# Patient Record
Sex: Male | Born: 1940 | Race: White | Hispanic: No | Marital: Married | Smoking: Former smoker
Health system: Southern US, Community
[De-identification: ages and names within clinical notes are randomized; demographics above are authoritative.]

## PROBLEM LIST (undated history)

## (undated) DIAGNOSIS — J386 Stenosis of larynx: Secondary | ICD-10-CM

## (undated) DIAGNOSIS — I480 Paroxysmal atrial fibrillation: Secondary | ICD-10-CM

## (undated) DIAGNOSIS — M109 Gout, unspecified: Secondary | ICD-10-CM

## (undated) DIAGNOSIS — E669 Obesity, unspecified: Secondary | ICD-10-CM

## (undated) DIAGNOSIS — J9621 Acute and chronic respiratory failure with hypoxia: Secondary | ICD-10-CM

## (undated) DIAGNOSIS — N529 Male erectile dysfunction, unspecified: Secondary | ICD-10-CM

## (undated) DIAGNOSIS — C3492 Malignant neoplasm of unspecified part of left bronchus or lung: Secondary | ICD-10-CM

## (undated) DIAGNOSIS — I10 Essential (primary) hypertension: Secondary | ICD-10-CM

## (undated) DIAGNOSIS — I4891 Unspecified atrial fibrillation: Secondary | ICD-10-CM

## (undated) DIAGNOSIS — C61 Malignant neoplasm of prostate: Secondary | ICD-10-CM

## (undated) DIAGNOSIS — E119 Type 2 diabetes mellitus without complications: Secondary | ICD-10-CM

## (undated) DIAGNOSIS — Z87442 Personal history of urinary calculi: Secondary | ICD-10-CM

## (undated) DIAGNOSIS — E039 Hypothyroidism, unspecified: Secondary | ICD-10-CM

## (undated) DIAGNOSIS — C7989 Secondary malignant neoplasm of other specified sites: Secondary | ICD-10-CM

## (undated) DIAGNOSIS — M459 Ankylosing spondylitis of unspecified sites in spine: Secondary | ICD-10-CM

## (undated) DIAGNOSIS — C44309 Unspecified malignant neoplasm of skin of other parts of face: Secondary | ICD-10-CM

## (undated) DIAGNOSIS — C73 Malignant neoplasm of thyroid gland: Secondary | ICD-10-CM

## (undated) DIAGNOSIS — M810 Age-related osteoporosis without current pathological fracture: Secondary | ICD-10-CM

## (undated) DIAGNOSIS — K5090 Crohn's disease, unspecified, without complications: Secondary | ICD-10-CM

## (undated) DIAGNOSIS — L409 Psoriasis, unspecified: Secondary | ICD-10-CM

## (undated) DIAGNOSIS — E109 Type 1 diabetes mellitus without complications: Secondary | ICD-10-CM

## (undated) HISTORY — DX: Male erectile dysfunction, unspecified: N52.9

## (undated) HISTORY — DX: Crohn's disease, unspecified, without complications: K50.90

## (undated) HISTORY — PX: TONSILLECTOMY: SUR1361

## (undated) HISTORY — DX: Secondary malignant neoplasm of other specified sites: C79.89

## (undated) HISTORY — DX: Obesity, unspecified: E66.9

## (undated) HISTORY — DX: Hypocalcemia: E83.51

## (undated) HISTORY — DX: Gout, unspecified: M10.9

## (undated) HISTORY — PX: HERNIA REPAIR: SHX51

## (undated) HISTORY — PX: BASAL CELL CARCINOMA EXCISION: SHX1214

## (undated) HISTORY — DX: Unspecified malignant neoplasm of skin of other parts of face: C44.309

## (undated) HISTORY — DX: Secondary malignant neoplasm of other specified sites: C61

## (undated) HISTORY — DX: Malignant neoplasm of unspecified part of left bronchus or lung: C34.92

## (undated) HISTORY — DX: Type 1 diabetes mellitus without complications: E10.9

## (undated) HISTORY — DX: Age-related osteoporosis without current pathological fracture: M81.0

## (undated) HISTORY — DX: Ankylosing spondylitis of unspecified sites in spine: M45.9

## (undated) HISTORY — PX: BACK SURGERY: SHX140

## (undated) HISTORY — PX: ABDOMINAL EXPLORATION SURGERY: SHX538

## (undated) HISTORY — DX: Psoriasis, unspecified: L40.9

---

## 1995-06-11 HISTORY — PX: TOTAL THYROIDECTOMY: SHX2547

## 1997-06-10 DIAGNOSIS — C73 Malignant neoplasm of thyroid gland: Secondary | ICD-10-CM

## 1997-06-10 HISTORY — DX: Malignant neoplasm of thyroid gland: C73

## 1999-01-12 ENCOUNTER — Ambulatory Visit (HOSPITAL_COMMUNITY): Admission: RE | Admit: 1999-01-12 | Discharge: 1999-01-12 | Payer: Self-pay | Admitting: Endocrinology

## 1999-01-15 ENCOUNTER — Encounter: Payer: Self-pay | Admitting: Endocrinology

## 2001-06-01 ENCOUNTER — Encounter: Admission: RE | Admit: 2001-06-01 | Discharge: 2001-06-01 | Payer: Self-pay | Admitting: Urology

## 2001-06-01 ENCOUNTER — Encounter: Payer: Self-pay | Admitting: Urology

## 2001-06-10 HISTORY — PX: PROSTATECTOMY: SHX69

## 2001-06-23 ENCOUNTER — Encounter: Payer: Self-pay | Admitting: Urology

## 2001-06-29 ENCOUNTER — Inpatient Hospital Stay (HOSPITAL_COMMUNITY): Admission: RE | Admit: 2001-06-29 | Discharge: 2001-07-02 | Payer: Self-pay | Admitting: Urology

## 2001-06-29 ENCOUNTER — Encounter (INDEPENDENT_AMBULATORY_CARE_PROVIDER_SITE_OTHER): Payer: Self-pay | Admitting: *Deleted

## 2003-02-17 HISTORY — PX: OTHER SURGICAL HISTORY: SHX169

## 2003-03-17 ENCOUNTER — Encounter: Payer: Self-pay | Admitting: Cardiology

## 2003-03-17 ENCOUNTER — Ambulatory Visit (HOSPITAL_COMMUNITY): Admission: RE | Admit: 2003-03-17 | Discharge: 2003-03-17 | Payer: Self-pay | Admitting: Cardiology

## 2003-03-17 HISTORY — PX: CARDIAC CATHETERIZATION: SHX172

## 2003-10-24 ENCOUNTER — Ambulatory Visit (HOSPITAL_COMMUNITY): Admission: RE | Admit: 2003-10-24 | Discharge: 2003-10-24 | Payer: Self-pay | Admitting: Endocrinology

## 2003-12-19 LAB — HM COLONOSCOPY

## 2003-12-22 ENCOUNTER — Ambulatory Visit (HOSPITAL_COMMUNITY): Admission: RE | Admit: 2003-12-22 | Discharge: 2003-12-22 | Payer: Self-pay | Admitting: Endocrinology

## 2004-02-03 ENCOUNTER — Encounter (HOSPITAL_COMMUNITY): Admission: RE | Admit: 2004-02-03 | Discharge: 2004-05-03 | Payer: Self-pay | Admitting: Gastroenterology

## 2004-05-28 ENCOUNTER — Ambulatory Visit: Payer: Self-pay | Admitting: Endocrinology

## 2004-05-28 ENCOUNTER — Ambulatory Visit: Payer: Self-pay

## 2004-05-30 ENCOUNTER — Ambulatory Visit: Payer: Self-pay | Admitting: Endocrinology

## 2004-05-30 ENCOUNTER — Inpatient Hospital Stay (HOSPITAL_COMMUNITY): Admission: EM | Admit: 2004-05-30 | Discharge: 2004-06-01 | Payer: Self-pay | Admitting: Endocrinology

## 2004-05-30 HISTORY — PX: OTHER SURGICAL HISTORY: SHX169

## 2004-06-06 ENCOUNTER — Ambulatory Visit: Payer: Self-pay | Admitting: Endocrinology

## 2004-06-13 ENCOUNTER — Ambulatory Visit: Payer: Self-pay | Admitting: Endocrinology

## 2004-07-06 ENCOUNTER — Ambulatory Visit: Payer: Self-pay | Admitting: Endocrinology

## 2004-07-19 ENCOUNTER — Ambulatory Visit: Payer: Self-pay | Admitting: Endocrinology

## 2004-09-27 ENCOUNTER — Ambulatory Visit: Payer: Self-pay | Admitting: Endocrinology

## 2004-10-16 ENCOUNTER — Ambulatory Visit: Payer: Self-pay | Admitting: Endocrinology

## 2004-11-21 ENCOUNTER — Ambulatory Visit: Payer: Self-pay | Admitting: Endocrinology

## 2005-01-09 ENCOUNTER — Ambulatory Visit: Payer: Self-pay | Admitting: Endocrinology

## 2005-01-23 ENCOUNTER — Ambulatory Visit: Payer: Self-pay | Admitting: Endocrinology

## 2005-08-15 ENCOUNTER — Ambulatory Visit: Payer: Self-pay | Admitting: Endocrinology

## 2005-08-22 ENCOUNTER — Ambulatory Visit: Payer: Self-pay | Admitting: Cardiology

## 2005-11-26 ENCOUNTER — Ambulatory Visit: Payer: Self-pay | Admitting: Endocrinology

## 2006-02-23 IMAGING — US US RETROPERITONEAL COMPLETE
1 series · 14 of 25 positions shown · non-contrast
Comparison: none

CLINICAL DATA: the patient has renal insufficiency. 
 RENAL ULTRASOUND
 The right kidney measures 11.1 cm and the left kidney measures 12.1 cm.  There is some lobulation to the cortex of the right kidney without a discrete mass. No hydronephrosis. Renal echo pattern is within normal limits.  
 IMPRESSION 
 No hydronephrosis.  Slight lobulation of the right kidney without definite abnormality.  Renal cortex echo pattern is within normal limits.

[Series 1: unknown · 0.25mm/px · 14 of 33 slices shown]
[im 1/33]
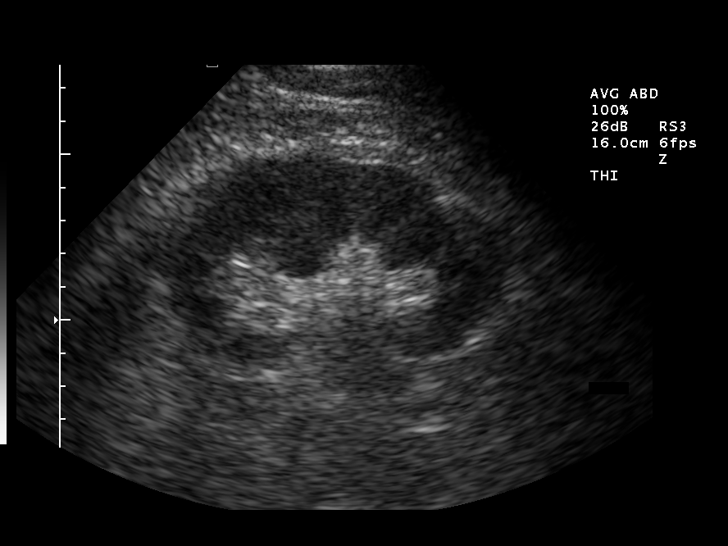
[im 3/33]
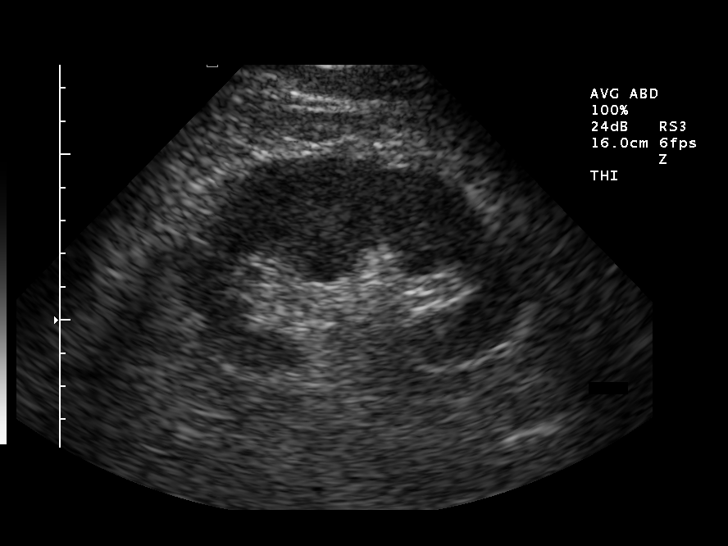
[im 6/33]
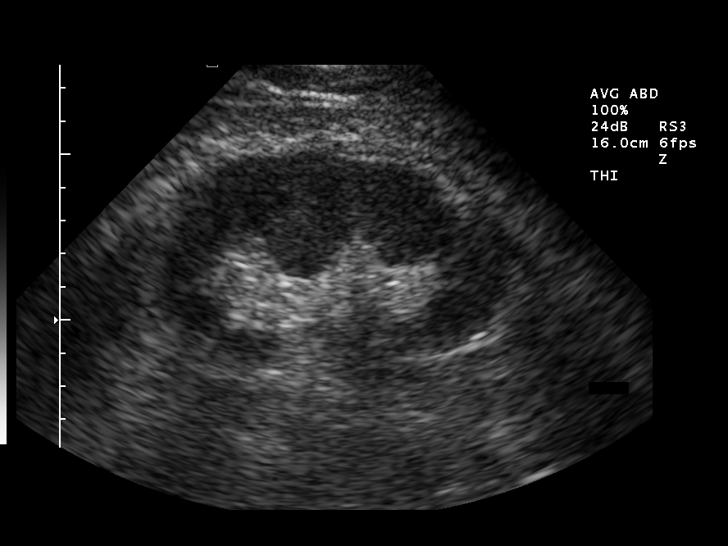
[im 9/33]
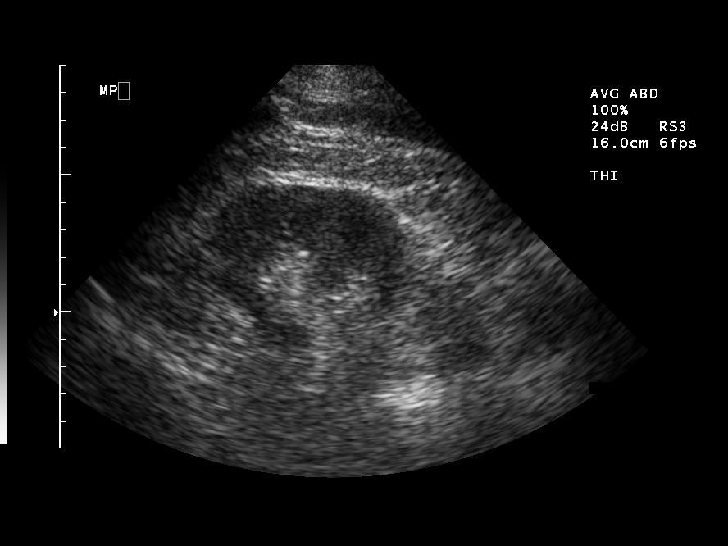
[im 11/33]
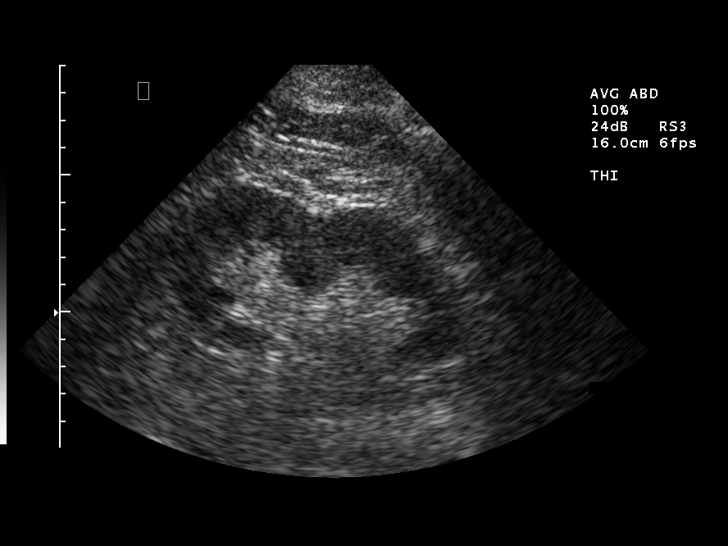
[im 13/33]
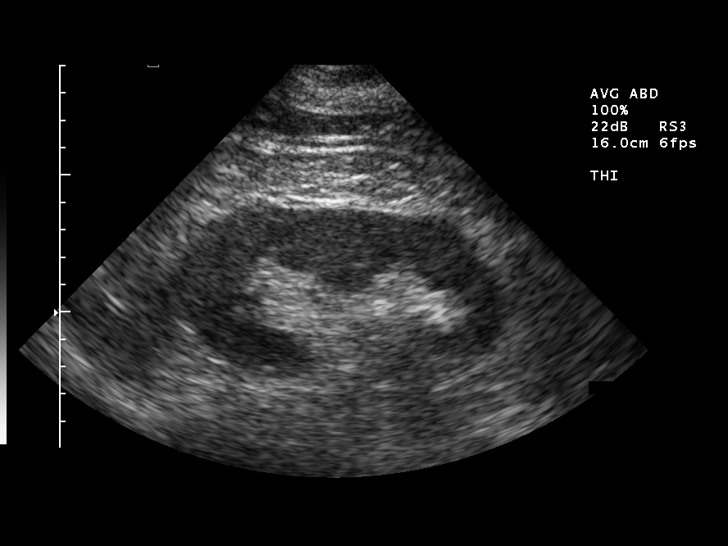
[im 15/33]
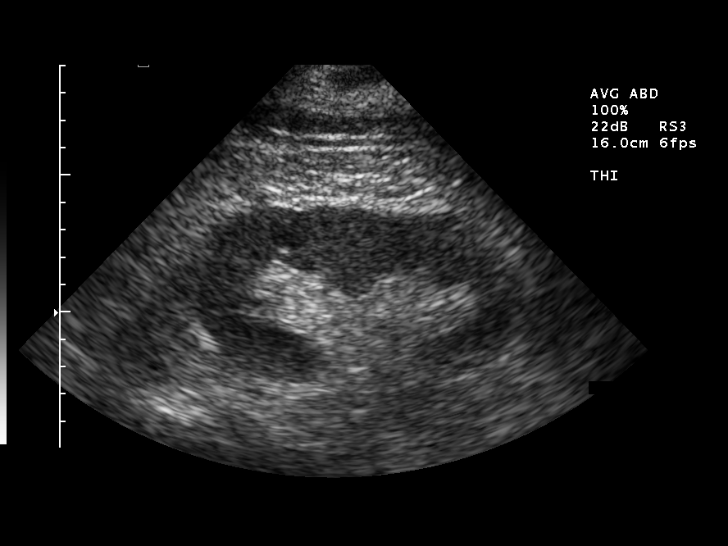
[im 18/33]
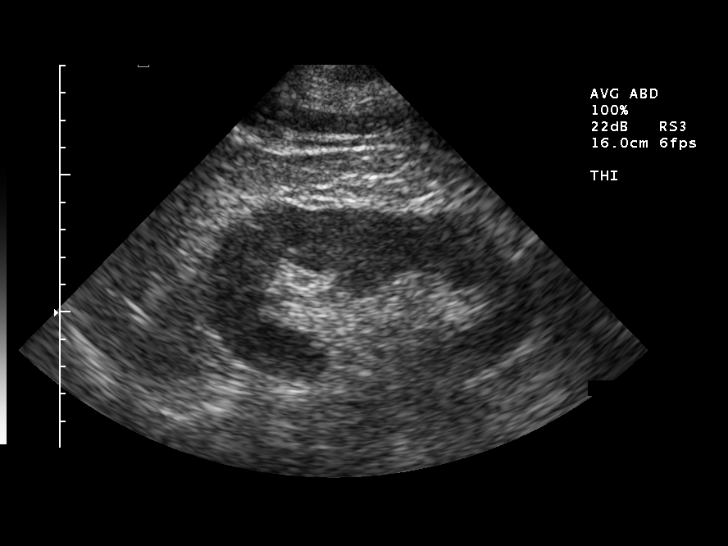
[im 21/33]
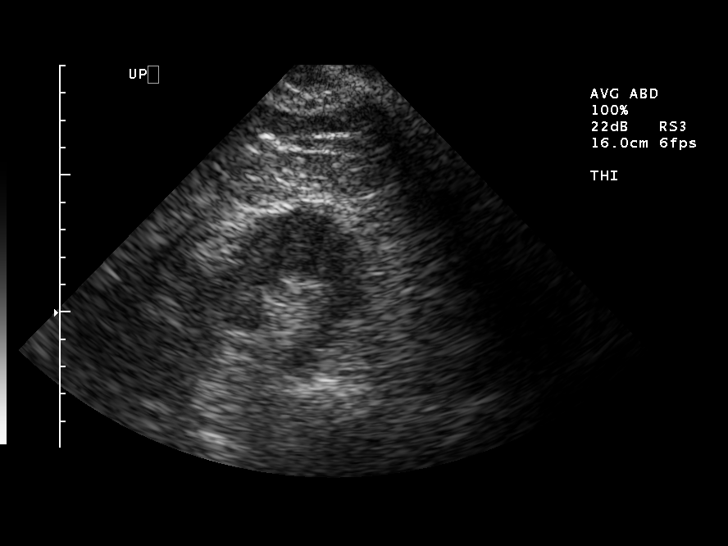
[im 22/33]
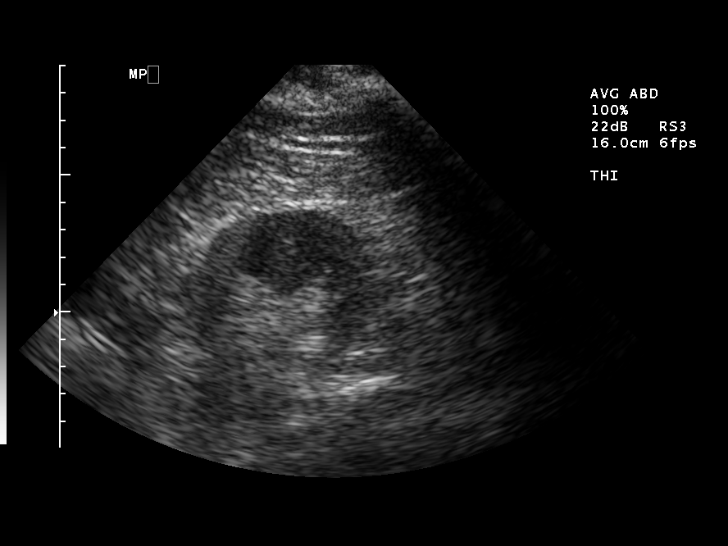
[im 25/33]
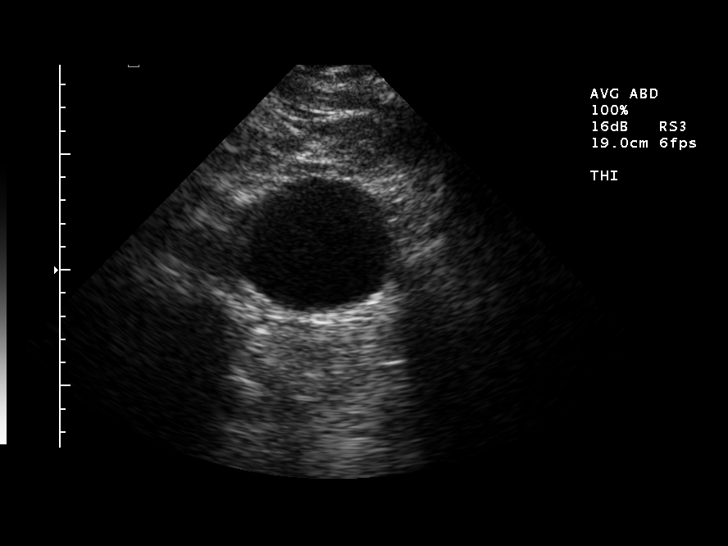
[im 27/33]
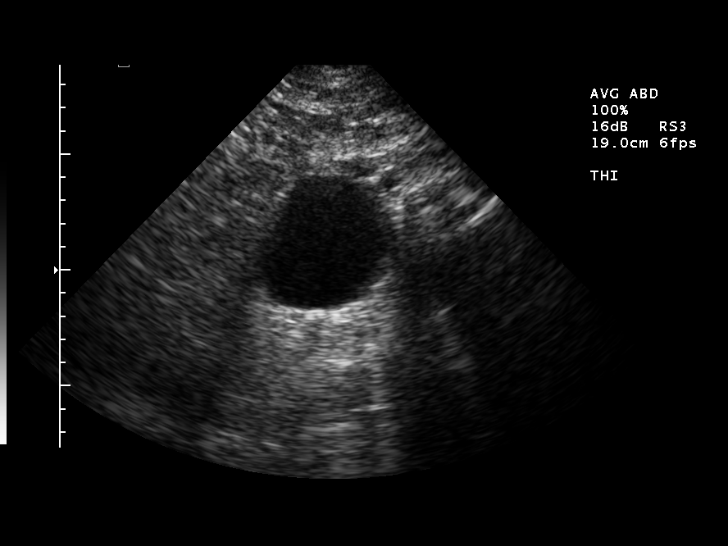
[im 30/33]
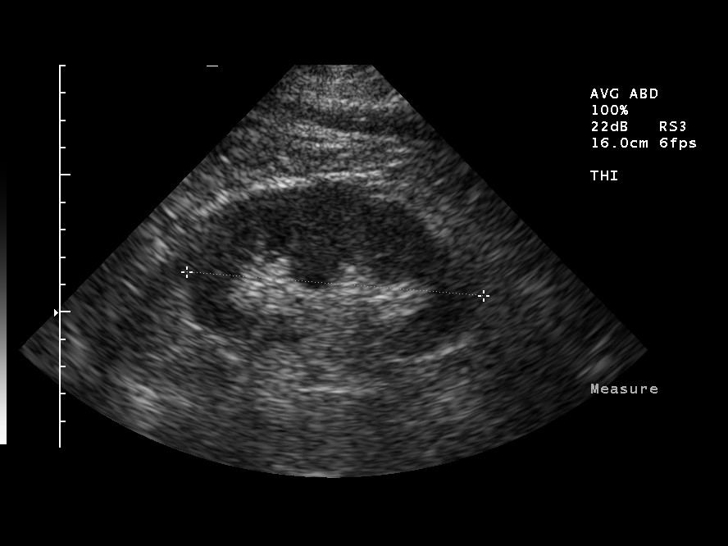
[im 33/33]
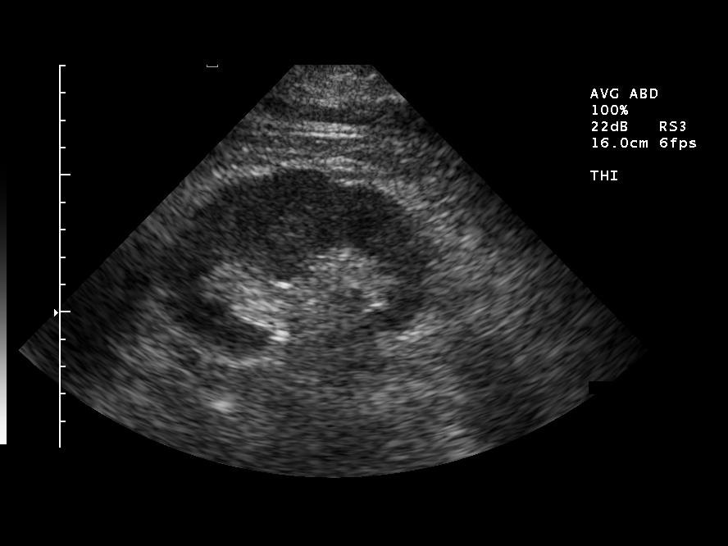

[14 of 25 positions shown; findings below may reference images not displayed]

## 2006-03-18 ENCOUNTER — Ambulatory Visit: Payer: Self-pay | Admitting: Endocrinology

## 2006-03-18 LAB — CONVERTED CEMR LAB
ALT: 32 units/L (ref 0–40)
AST: 36 units/L (ref 0–37)
Albumin: 3.5 g/dL (ref 3.5–5.2)
Alkaline Phosphatase: 56 units/L (ref 39–117)
BUN: 21 mg/dL (ref 6–23)
Basophils Absolute: 0 10*3/uL (ref 0.0–0.1)
Basophils Relative: 0.6 % (ref 0.0–1.0)
Bilirubin Urine: NEGATIVE
CO2: 25 meq/L (ref 19–32)
Calcium: 9.3 mg/dL (ref 8.4–10.5)
Chloride: 110 meq/L (ref 96–112)
Chol/HDL Ratio, serum: 3.1
Cholesterol: 113 mg/dL (ref 0–200)
Creatinine, Ser: 1.8 mg/dL — ABNORMAL HIGH (ref 0.4–1.5)
Creatinine,U: 208 mg/dL
Eosinophil percent: 2.4 % (ref 0.0–5.0)
GFR calc non Af Amer: 41 mL/min
Glomerular Filtration Rate, Af Am: 49 mL/min/{1.73_m2}
Glucose, Bld: 85 mg/dL (ref 70–99)
HCT: 35.5 % — ABNORMAL LOW (ref 39.0–52.0)
HDL: 36.2 mg/dL — ABNORMAL LOW (ref >39.0)
Hemoglobin, Urine: NEGATIVE
Hemoglobin: 11.9 g/dL — ABNORMAL LOW (ref 13.0–17.0)
Ketones, ur: NEGATIVE mg/dL
LDL Cholesterol: 47 mg/dL (ref 0–99)
Leukocytes, UA: NEGATIVE
Lymphocytes Relative: 12 % (ref 12.0–46.0)
MCHC: 33.4 g/dL (ref 30.0–36.0)
MCV: 98.6 fL (ref 78.0–100.0)
Microalb Creat Ratio: 2.9 mg/g (ref 0.0–30.0)
Microalb, Ur: 0.6 mg/dL (ref 0.0–1.9)
Monocytes Absolute: 0.6 10*3/uL (ref 0.2–0.7)
Monocytes Relative: 8.3 % (ref 3.0–11.0)
Neutro Abs: 5.4 10*3/uL (ref 1.4–7.7)
Neutrophils Relative %: 76.7 % (ref 43.0–77.0)
Nitrite: NEGATIVE
PSA: 0.29 ng/mL (ref 0.10–4.00)
Platelets: 398 10*3/uL (ref 150–400)
Potassium: 4.4 meq/L (ref 3.5–5.1)
RBC: 3.6 M/uL — ABNORMAL LOW (ref 4.22–5.81)
RDW: 15.3 % — ABNORMAL HIGH (ref 11.5–14.6)
Sodium: 141 meq/L (ref 135–145)
Specific Gravity, Urine: >=1.03 (ref 1.000–1.03)
TSH: 0.6 microintl units/mL (ref 0.35–5.50)
Total Bilirubin: 0.7 mg/dL (ref 0.3–1.2)
Total Protein, Urine: NEGATIVE mg/dL
Total Protein: 6.4 g/dL (ref 6.0–8.3)
Triglyceride fasting, serum: 151 mg/dL — ABNORMAL HIGH (ref 0–149)
Urine Glucose: NEGATIVE mg/dL
Urobilinogen, UA: 0.2 (ref 0.0–1.0)
VLDL: 30 mg/dL (ref 0–40)
WBC: 7 10*3/uL (ref 4.5–10.5)
pH: 6 (ref 5.0–8.0)

## 2006-03-27 ENCOUNTER — Ambulatory Visit: Payer: Self-pay | Admitting: Endocrinology

## 2006-05-07 ENCOUNTER — Ambulatory Visit (HOSPITAL_COMMUNITY): Admission: RE | Admit: 2006-05-07 | Discharge: 2006-05-07 | Payer: Self-pay | Admitting: Gastroenterology

## 2006-05-08 ENCOUNTER — Ambulatory Visit (HOSPITAL_COMMUNITY): Admission: RE | Admit: 2006-05-08 | Discharge: 2006-05-08 | Payer: Self-pay | Admitting: Gastroenterology

## 2006-08-02 IMAGING — CR DG CHEST 2V
3 series · 3 of 3 positions shown · non-contrast
Comparison: none
 There is mild elevation of the right hemidiaphragm and there are mild linear atelectatic/scarring changes seen within the right base.

CLINICAL DATA: cellulitis
 CHEST-TWO VIEWS:

[view not recorded (1 of 3)]
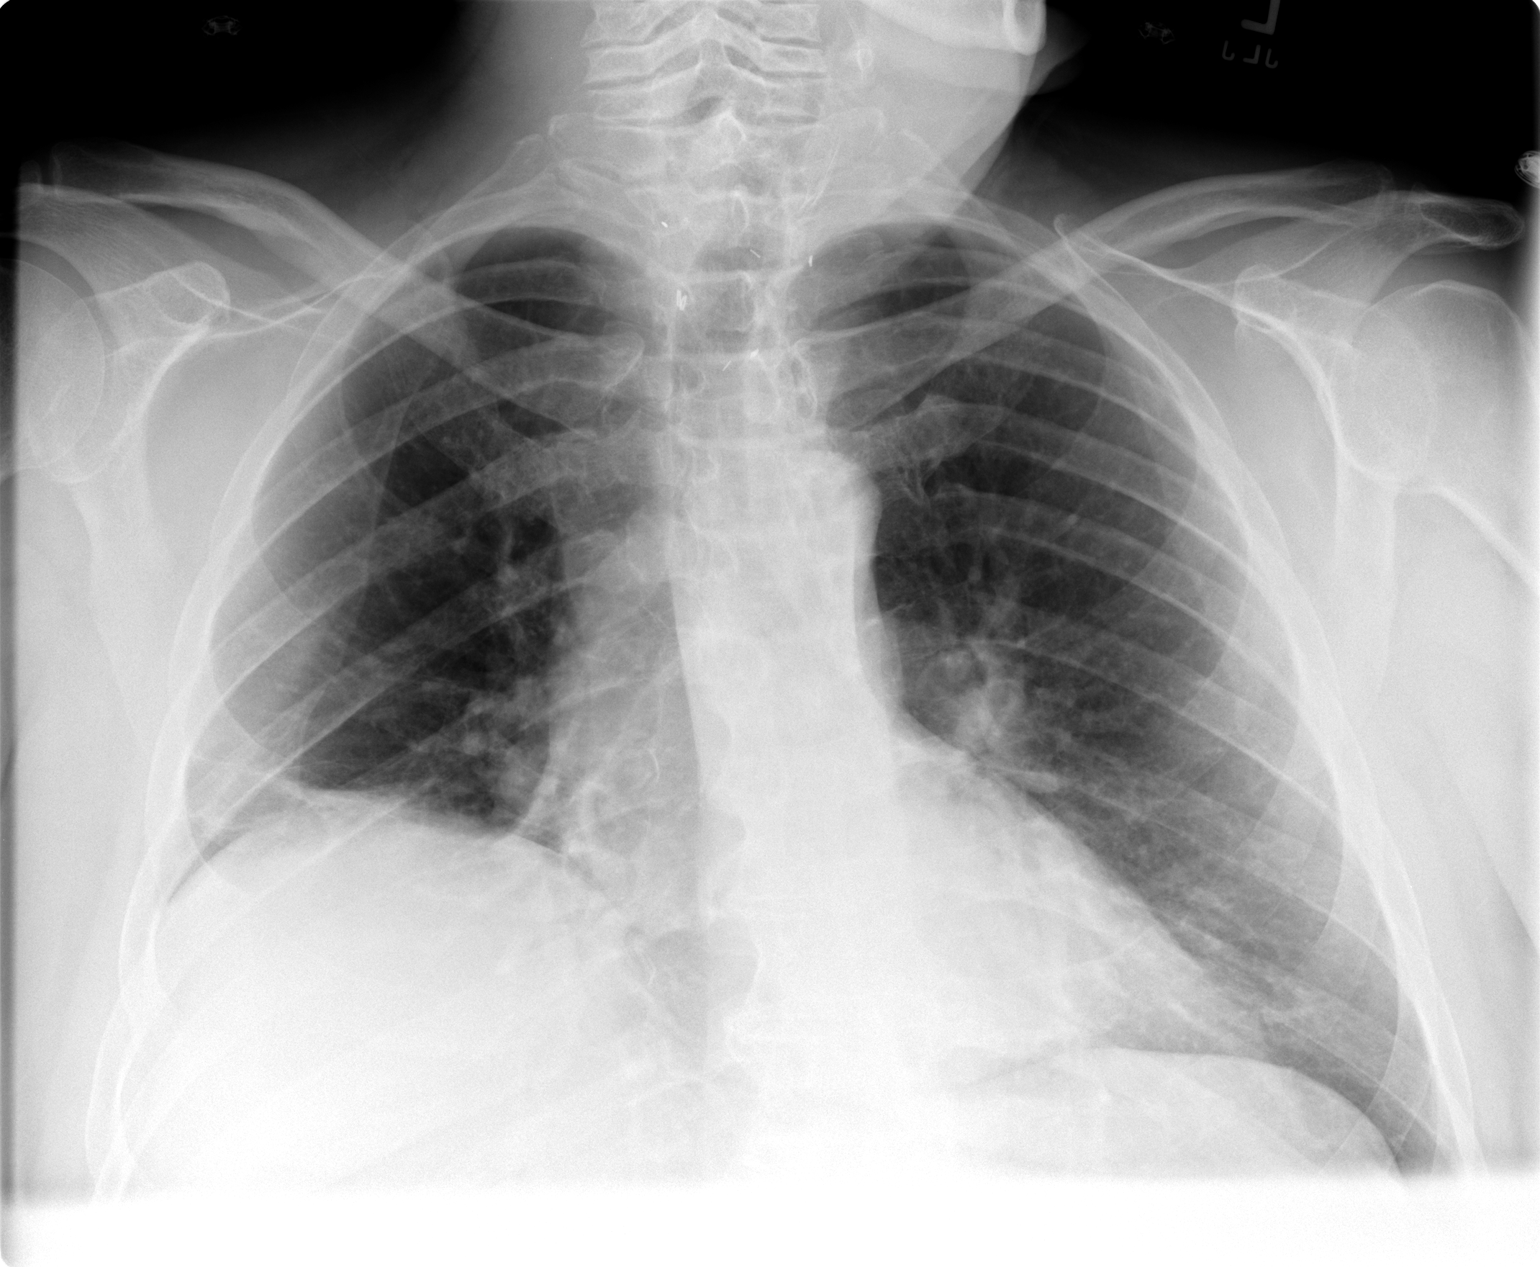

[view not recorded (2 of 3)]
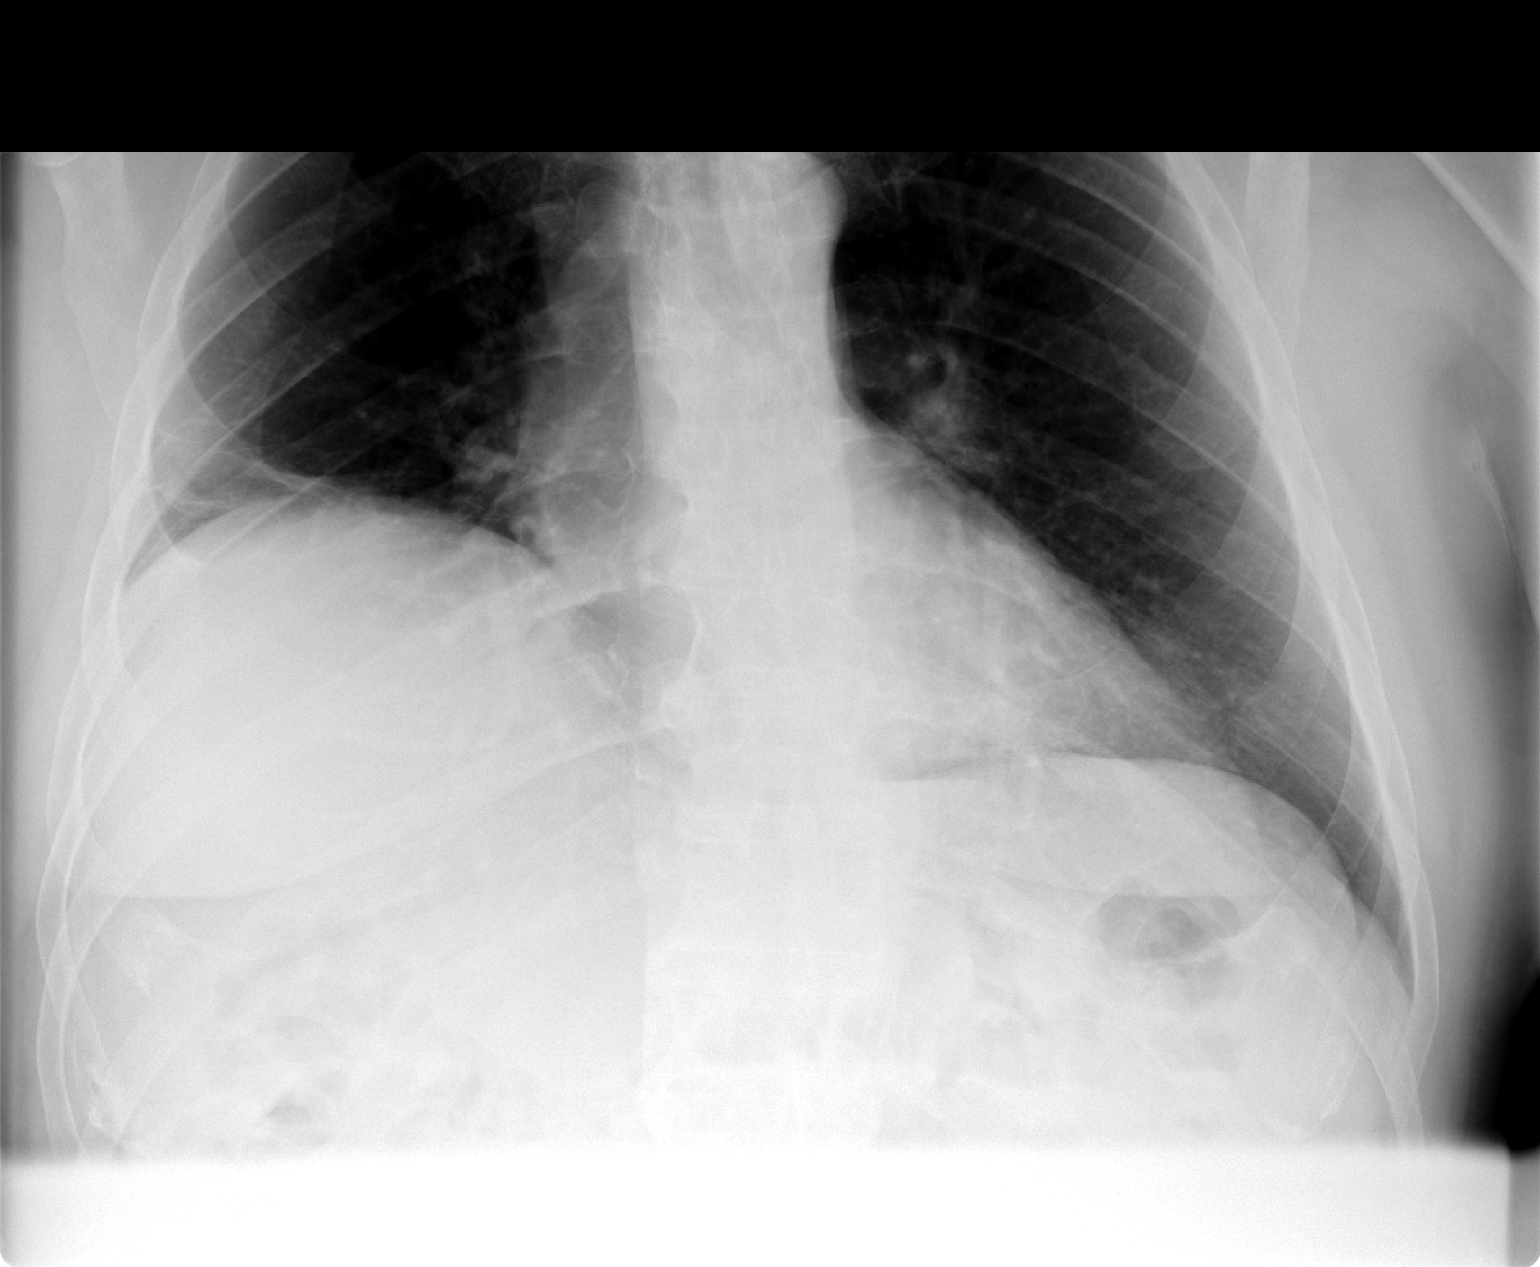

[view not recorded (3 of 3)]
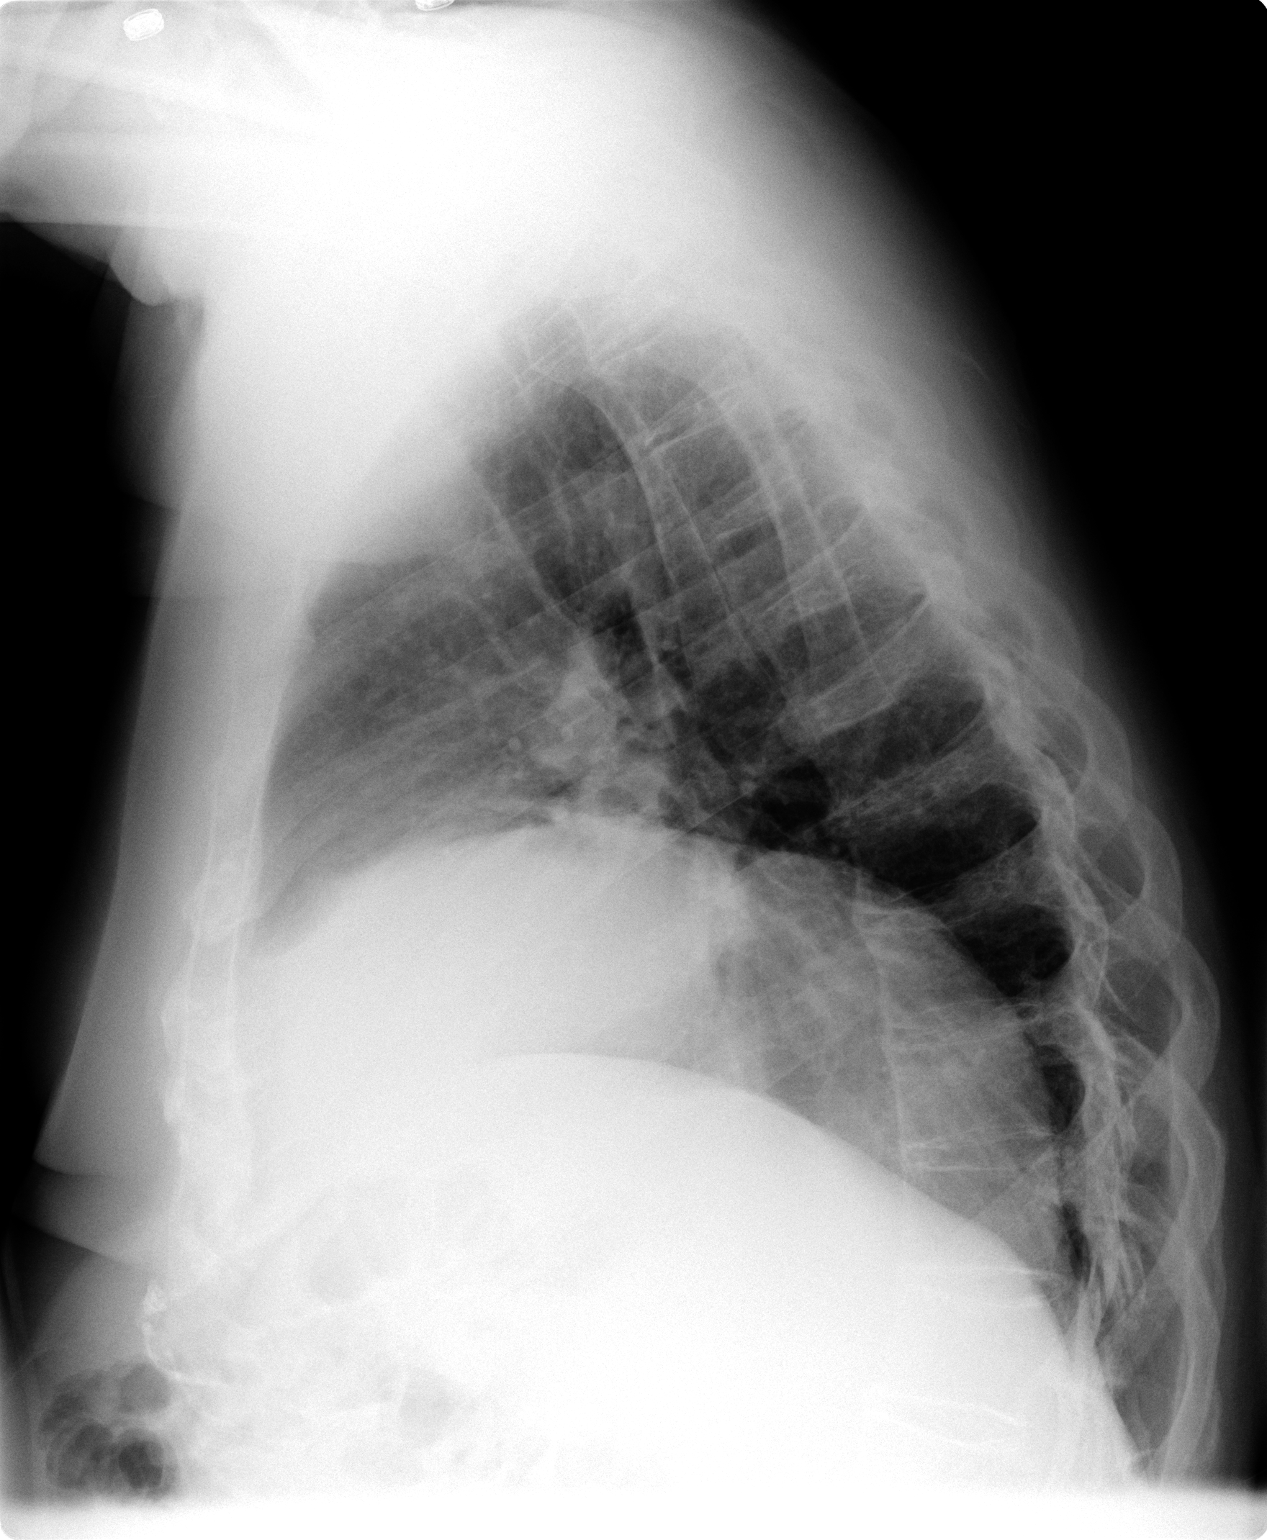

[3 of 3 positions shown; findings below may reference images not displayed]

The left lung is clear.  The heart and mediastinal structures are normal.   There are surgical clips seen within the superior mediastinum.
IMPRESSION: Elevation of the right hemidiaphragm with mild linear atelectatic/scarring changes within the right lung base.

## 2006-09-09 ENCOUNTER — Ambulatory Visit: Payer: Self-pay | Admitting: Endocrinology

## 2006-09-09 LAB — CONVERTED CEMR LAB
Basophils Absolute: 0 10*3/uL (ref 0.0–0.1)
Basophils Relative: 0.4 % (ref 0.0–1.0)
Eosinophils Absolute: 0.1 10*3/uL (ref 0.0–0.6)
Eosinophils Relative: 1.4 % (ref 0.0–5.0)
HCT: 35.6 % — ABNORMAL LOW (ref 39.0–52.0)
Hemoglobin: 12.2 g/dL — ABNORMAL LOW (ref 13.0–17.0)
Hgb A1c MFr Bld: 5.6 % (ref 4.6–6.0)
Iron: 51 ug/dL (ref 42–165)
Lymphocytes Relative: 10 % — ABNORMAL LOW (ref 12.0–46.0)
MCHC: 34.3 g/dL (ref 30.0–36.0)
MCV: 99.8 fL (ref 78.0–100.0)
Monocytes Absolute: 0.8 10*3/uL — ABNORMAL HIGH (ref 0.2–0.7)
Monocytes Relative: 8.3 % (ref 3.0–11.0)
Neutro Abs: 7.9 10*3/uL — ABNORMAL HIGH (ref 1.4–7.7)
Neutrophils Relative %: 79.9 % — ABNORMAL HIGH (ref 43.0–77.0)
Platelets: 333 10*3/uL (ref 150–400)
RBC: 3.57 M/uL — ABNORMAL LOW (ref 4.22–5.81)
RDW: 14.9 % — ABNORMAL HIGH (ref 11.5–14.6)
Saturation Ratios: 14.8 % — ABNORMAL LOW (ref 20.0–50.0)
Thyroglobulin Ab: <30 (ref 0.0–60.0)
Transferrin: 246.3 mg/dL (ref >=212.0)
WBC: 9.8 10*3/uL (ref 4.5–10.5)

## 2006-12-29 ENCOUNTER — Encounter: Payer: Self-pay | Admitting: Endocrinology

## 2006-12-29 DIAGNOSIS — M810 Age-related osteoporosis without current pathological fracture: Secondary | ICD-10-CM | POA: Insufficient documentation

## 2006-12-29 DIAGNOSIS — Z794 Long term (current) use of insulin: Secondary | ICD-10-CM

## 2006-12-29 DIAGNOSIS — C61 Malignant neoplasm of prostate: Secondary | ICD-10-CM | POA: Insufficient documentation

## 2006-12-29 DIAGNOSIS — I10 Essential (primary) hypertension: Secondary | ICD-10-CM | POA: Insufficient documentation

## 2006-12-29 DIAGNOSIS — C7989 Secondary malignant neoplasm of other specified sites: Secondary | ICD-10-CM | POA: Insufficient documentation

## 2006-12-29 DIAGNOSIS — E1129 Type 2 diabetes mellitus with other diabetic kidney complication: Secondary | ICD-10-CM | POA: Insufficient documentation

## 2006-12-29 DIAGNOSIS — M109 Gout, unspecified: Secondary | ICD-10-CM | POA: Insufficient documentation

## 2007-03-11 HISTORY — PX: COLON SURGERY: SHX602

## 2007-03-11 HISTORY — PX: APPENDECTOMY: SHX54

## 2007-03-13 ENCOUNTER — Encounter: Admission: RE | Admit: 2007-03-13 | Discharge: 2007-03-13 | Payer: Self-pay | Admitting: Obstetrics and Gynecology

## 2007-03-26 ENCOUNTER — Telehealth (INDEPENDENT_AMBULATORY_CARE_PROVIDER_SITE_OTHER): Payer: Self-pay | Admitting: *Deleted

## 2007-04-01 ENCOUNTER — Inpatient Hospital Stay (HOSPITAL_COMMUNITY): Admission: RE | Admit: 2007-04-01 | Discharge: 2007-04-07 | Payer: Self-pay | Admitting: Surgery

## 2007-04-01 ENCOUNTER — Encounter (INDEPENDENT_AMBULATORY_CARE_PROVIDER_SITE_OTHER): Payer: Self-pay | Admitting: Surgery

## 2007-04-13 ENCOUNTER — Ambulatory Visit: Payer: Self-pay | Admitting: Endocrinology

## 2007-04-13 HISTORY — DX: Hypocalcemia: E83.51

## 2007-04-14 ENCOUNTER — Encounter: Payer: Self-pay | Admitting: Endocrinology

## 2007-04-14 LAB — CONVERTED CEMR LAB
Calcium, Total (PTH): 9.1 mg/dL (ref 8.4–10.5)
Cholesterol: 139 mg/dL (ref 0–200)
Direct LDL: 61.6 mg/dL
HDL: 42 mg/dL (ref >39.0)
Hgb A1c MFr Bld: 5.9 % (ref 4.6–6.0)
PTH: 22.3 pg/mL (ref 14.0–72.0)
TSH: 12.66 microintl units/mL — ABNORMAL HIGH (ref 0.35–5.50)
Total CHOL/HDL Ratio: 3.3
Triglycerides: 219 mg/dL (ref 0–149)
VLDL: 44 mg/dL — ABNORMAL HIGH (ref 0–40)

## 2007-10-25 IMAGING — CT CT NECK W/O CM
3 of 4 series · 16 of 33 positions shown, 19 images · IV contrast (agent unspecified)
Comparison: none

CLINICAL DATA: Thyroid cancer post thyroidectomy six years ago.
 CT NECK WITHOUT CONTRAST:
 The study was done without contrast.  The patient is diabetic and has a serum creatinine of 1.7.  Dr. Brando and I discussed the case and we elected to proceed with an unenhanced exam.
TECHNIQUE: Thin section axial cuts were obtained.  Sagittal and coronal reformatted images were generated from the axial data set.

[Series 2: neck_routine 3.0 b40s st · axial · 0.42mm/px · z∈[-316,-110]mm · 8 of 89 slices shown, 10 images]
[im 10/89  soft-tissue]
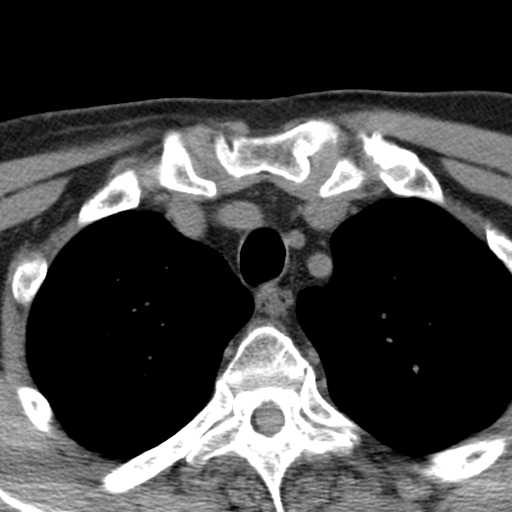
[im 10/89  bone]
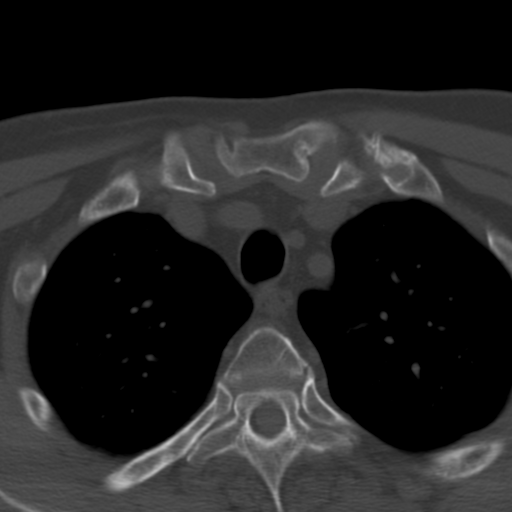
[im 20/89  bone]
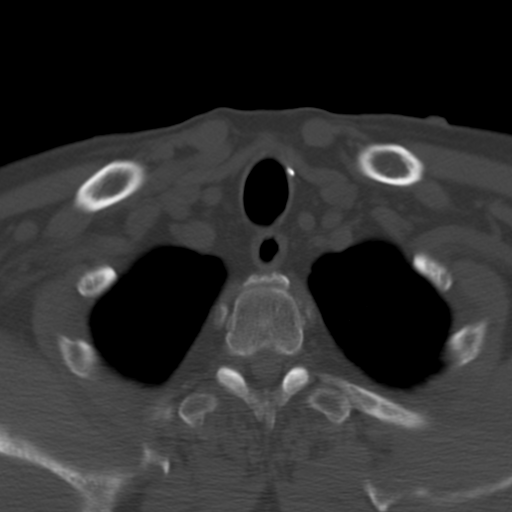
[im 30/89  bone]
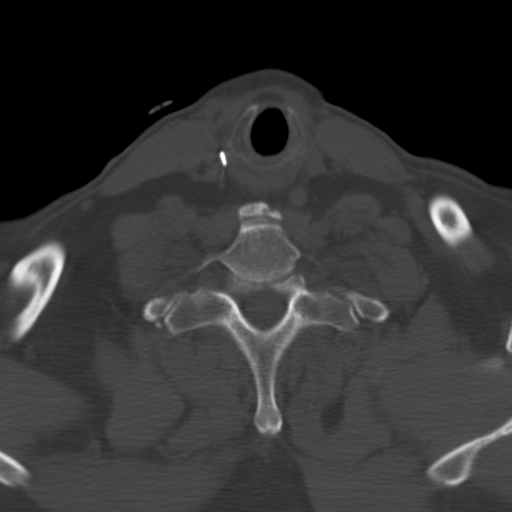
[im 40/89  bone]
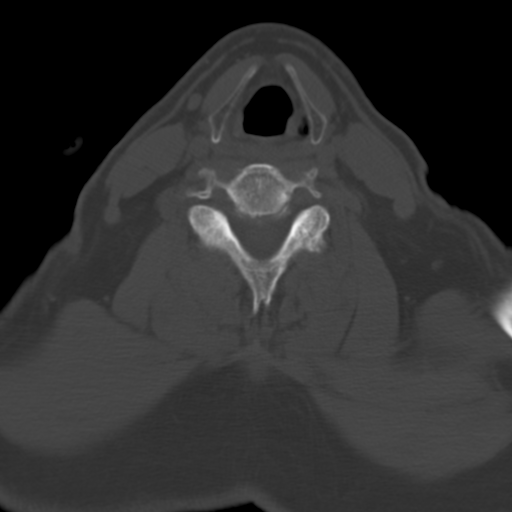
[im 49/89  soft-tissue]
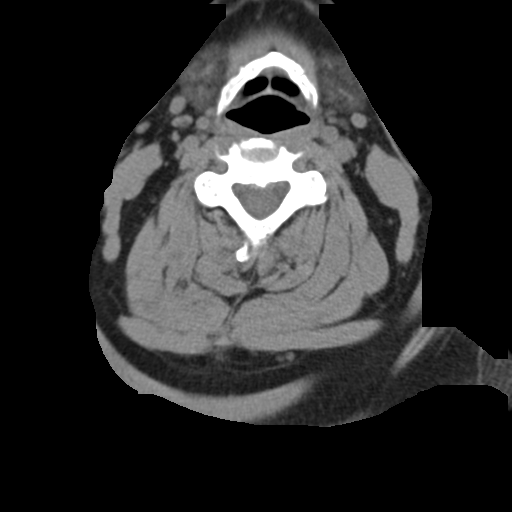
[im 49/89  bone]
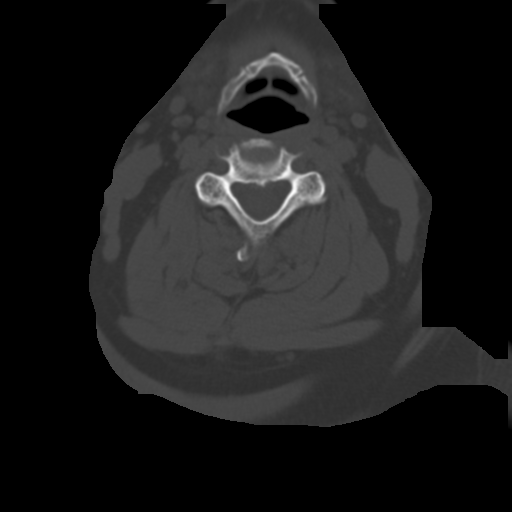
[im 59/89  bone]
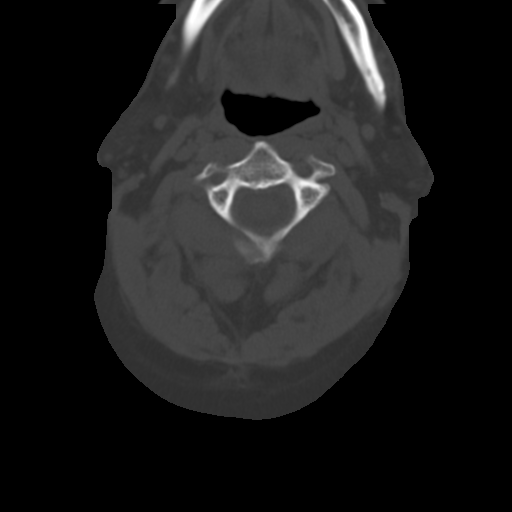
[im 69/89  bone]
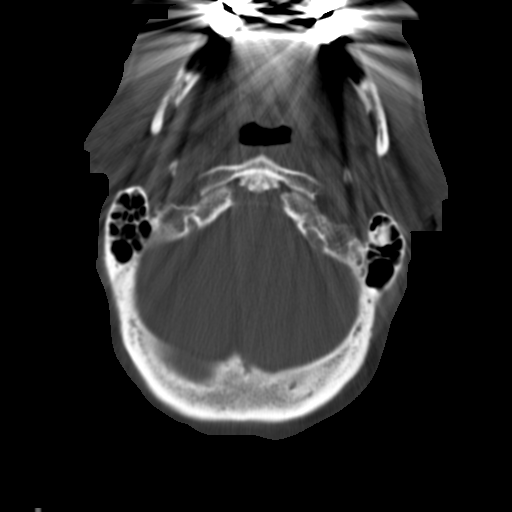
[im 79/89  bone]
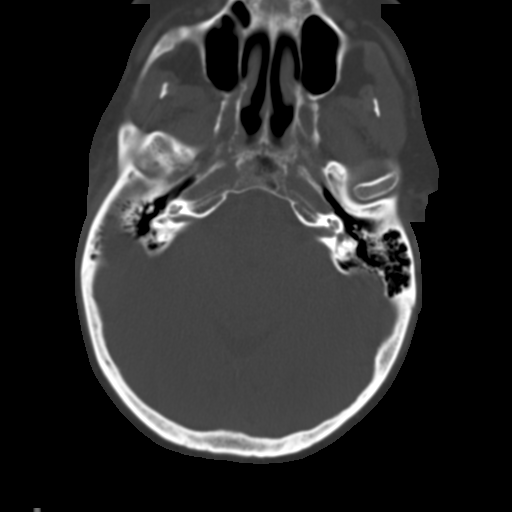

[Series 602: <mpr range> · coronal · 0.52mm/px · 3 of 39 slices shown]
[im 8/39  bone]
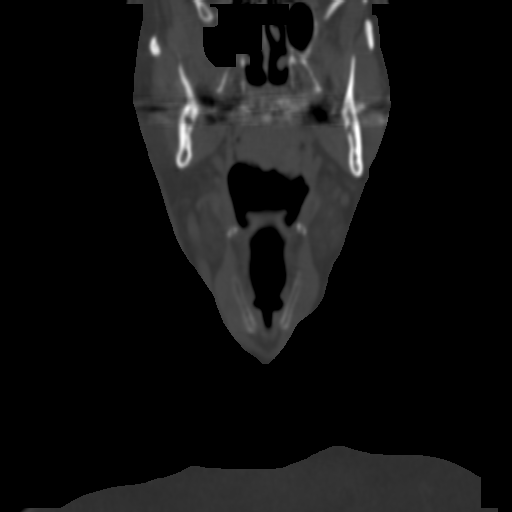
[im 16/39  bone]
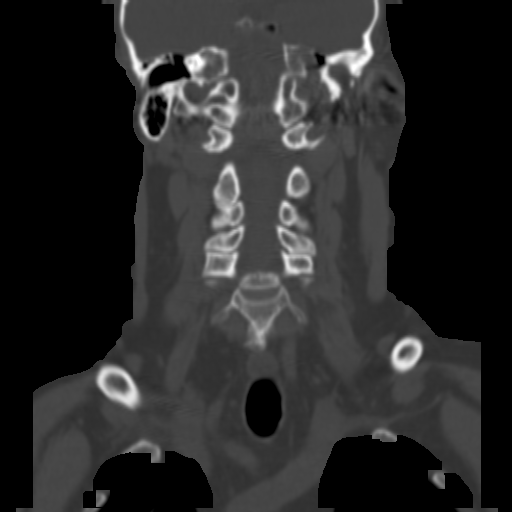
[im 23/39  bone]
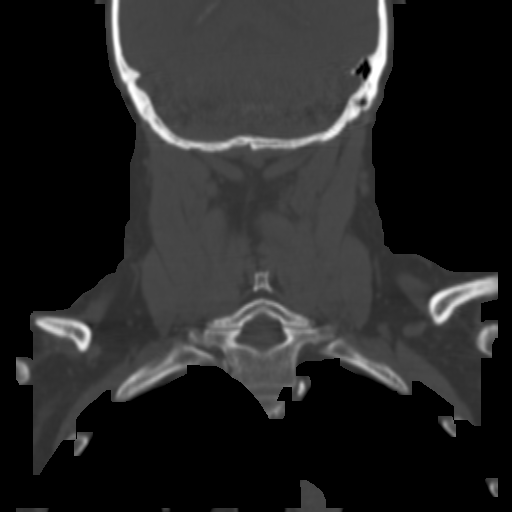

[Series 603: <mpr range(1)> · sagittal · 0.52mm/px · 5 of 39 slices shown, 6 images]
[im 13/39  bone]
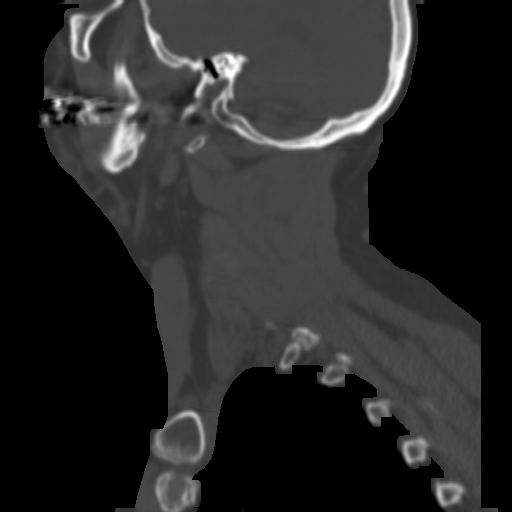
[im 16/39  bone]
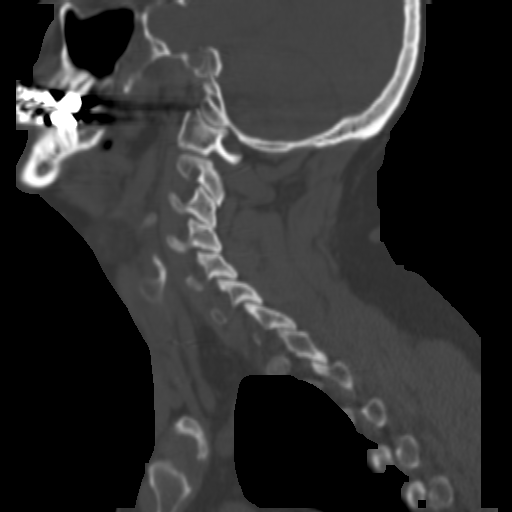
[im 20/39  soft-tissue]
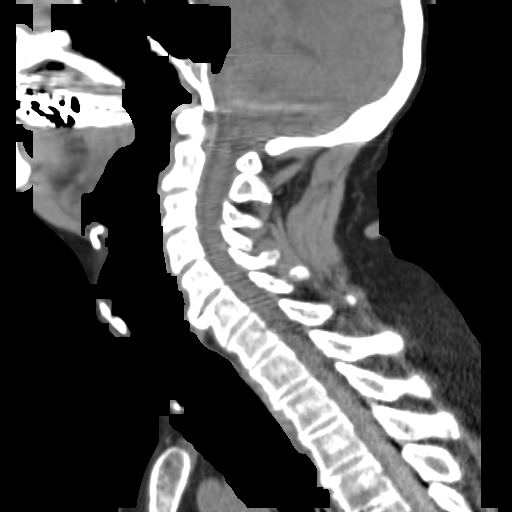
[im 20/39  bone]
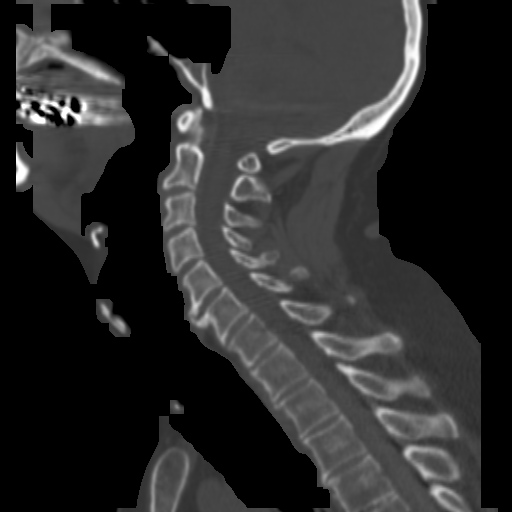
[im 23/39  bone]
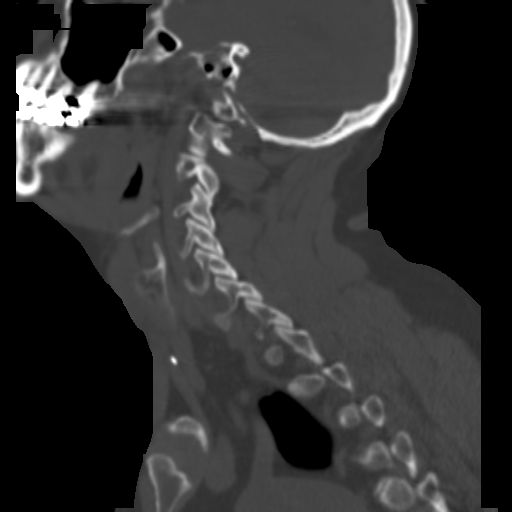
[im 26/39  bone]
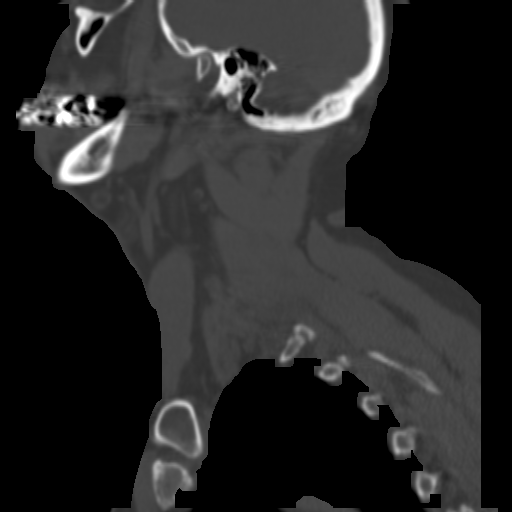

[16 of 33 positions shown; findings below may reference images not displayed]

FINDINGS: Status post total thyroidectomy.  There are surgical clips in the thyroid bed.  No definite recurrent mass.  I do not see any significant adenopathy.  Of course, the exam is limited because vascular structures are not enhanced.  Some rounded structures can be seen to be vessels based on their tubular, elongated morphology.  I do not have a suspicion of significant adenopathy on this limited exam.  The parotid glands are symmetrical.  The posterior triangle is unremarkable.  Prevertebral space normal.
IMPRESSION: 1.  The study is limited because IV contrast was not used.  
 2.  Status post total thyroidectomy.  
 3.  No suggestion of recurrent tumor or adenopathy.

## 2007-11-24 ENCOUNTER — Encounter: Payer: Self-pay | Admitting: Endocrinology

## 2008-01-30 IMAGING — RF SMALL BOWEL
1 series · 8 of 8 positions shown · non-contrast
Comparison: none

[Series 102: run · 8 of 8 slices shown]
[im 1/8]
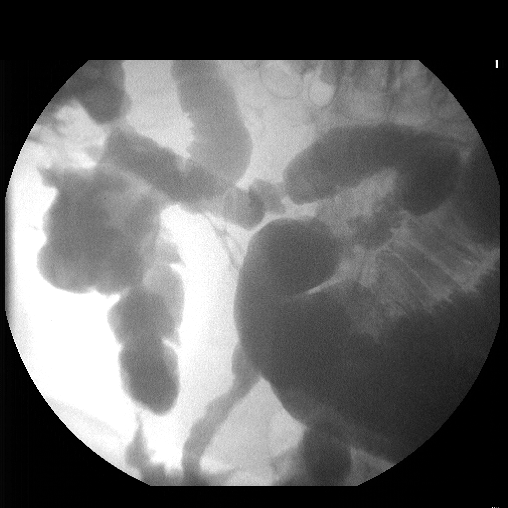
[im 2/8]
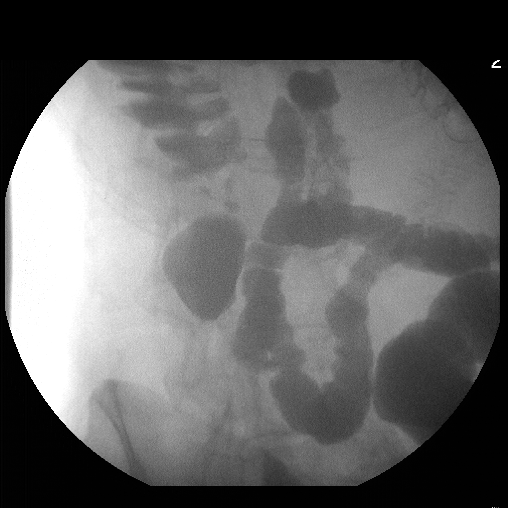
[im 3/8]
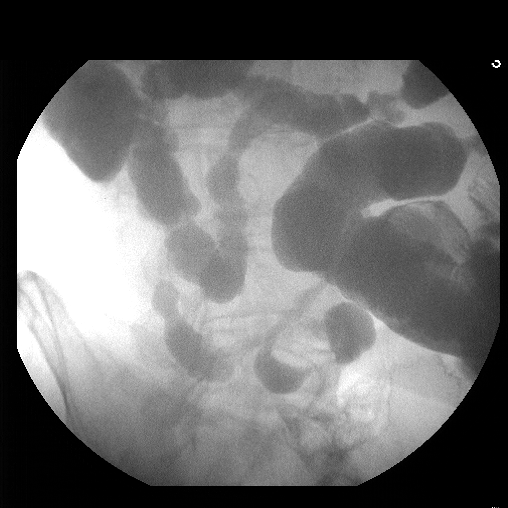
[im 4/8]
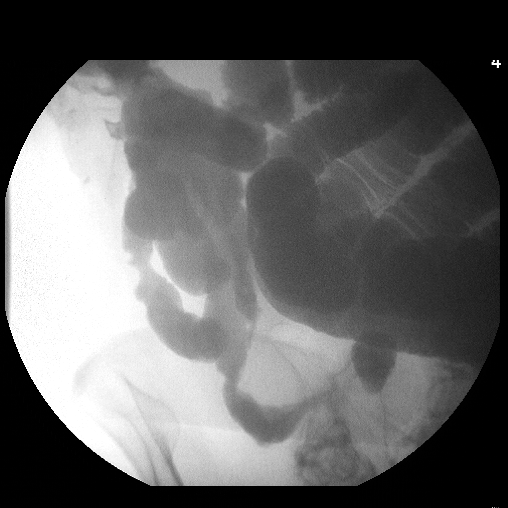
[im 5/8]
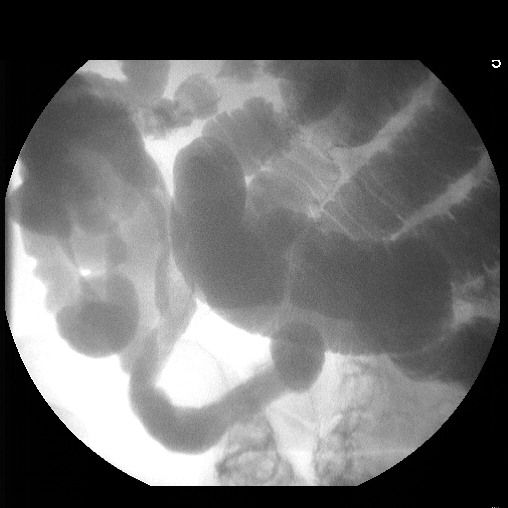
[im 6/8]
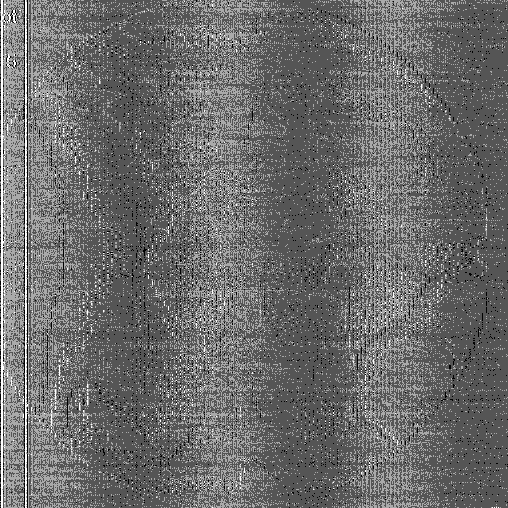
[im 7/8]
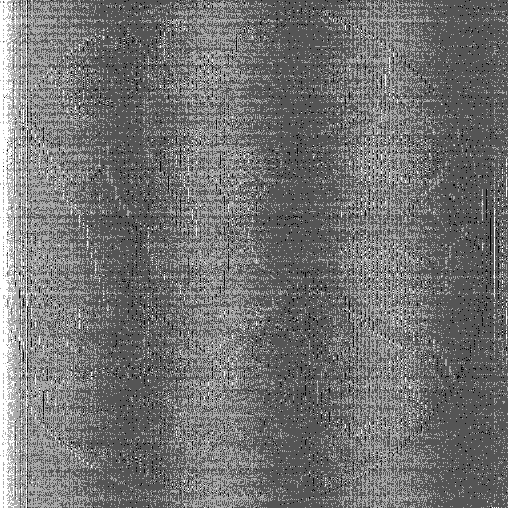
[im 8/8]
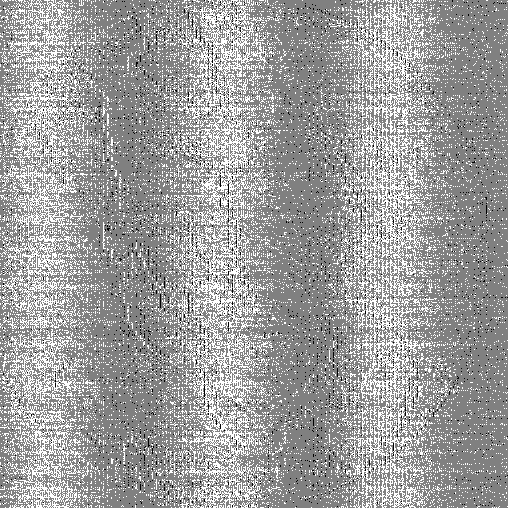

[8 of 8 positions shown; findings below may reference images not displayed]

______________________________________________________________
SINO, GOJE 07/13/1962 UNO, EVER DE JESUS [DATE]

REASON FOR CONSULTATION: Last Menstrual Date:..... HYST Reason
for Consultation:. INJ Comment to Radiology:.... FAST TRACK ENT
____________________________________________

EXAM: FOOT ROUTINE

Three views show no apparent fracture.

p>

## 2008-03-04 ENCOUNTER — Inpatient Hospital Stay (HOSPITAL_COMMUNITY): Admission: RE | Admit: 2008-03-04 | Discharge: 2008-03-06 | Payer: Self-pay | Admitting: Surgery

## 2008-04-14 ENCOUNTER — Ambulatory Visit: Payer: Self-pay | Admitting: Endocrinology

## 2008-04-14 DIAGNOSIS — K439 Ventral hernia without obstruction or gangrene: Secondary | ICD-10-CM | POA: Insufficient documentation

## 2008-04-14 DIAGNOSIS — K5090 Crohn's disease, unspecified, without complications: Secondary | ICD-10-CM | POA: Insufficient documentation

## 2008-04-14 HISTORY — PX: VENTRAL HERNIA REPAIR: SHX424

## 2008-04-17 DIAGNOSIS — C44309 Unspecified malignant neoplasm of skin of other parts of face: Secondary | ICD-10-CM | POA: Insufficient documentation

## 2008-04-17 DIAGNOSIS — Z85850 Personal history of malignant neoplasm of thyroid: Secondary | ICD-10-CM | POA: Insufficient documentation

## 2008-04-17 DIAGNOSIS — E890 Postprocedural hypothyroidism: Secondary | ICD-10-CM | POA: Insufficient documentation

## 2008-04-26 ENCOUNTER — Telehealth: Payer: Self-pay | Admitting: Endocrinology

## 2008-07-07 ENCOUNTER — Encounter: Payer: Self-pay | Admitting: Endocrinology

## 2008-07-07 ENCOUNTER — Telehealth (INDEPENDENT_AMBULATORY_CARE_PROVIDER_SITE_OTHER): Payer: Self-pay | Admitting: *Deleted

## 2008-07-09 IMAGING — CR DG ABDOMEN 1V
2 series · 2 of 2 positions shown · non-contrast
Comparison: none

CLINICAL DATA: Abdominal pain, ingested capsule for endo on 05/06/06 query location of capsule.  
 ABDOMEN ? 1 VIEW:

[t abdomen supine (1 of 2)]
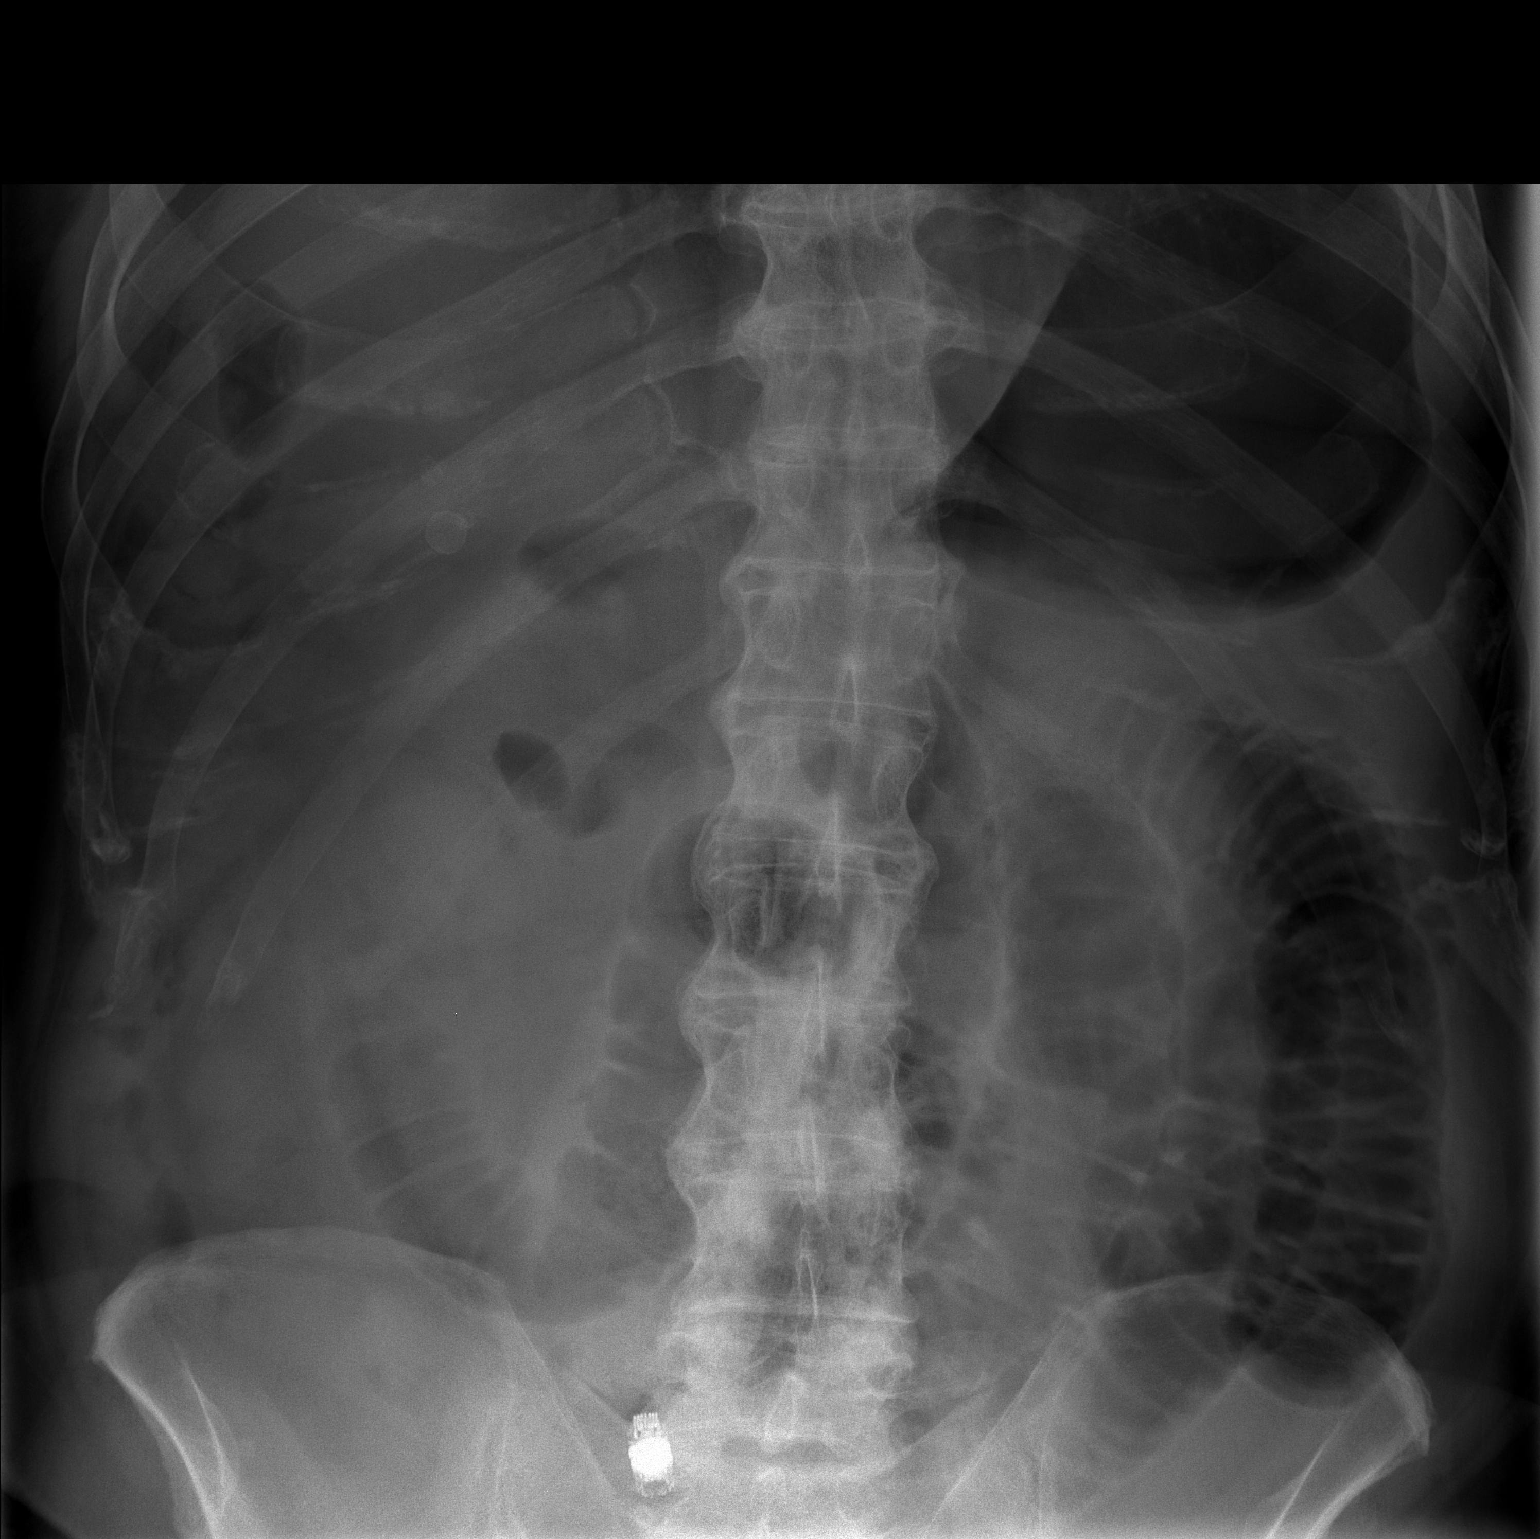

[t abdomen supine (2 of 2)]
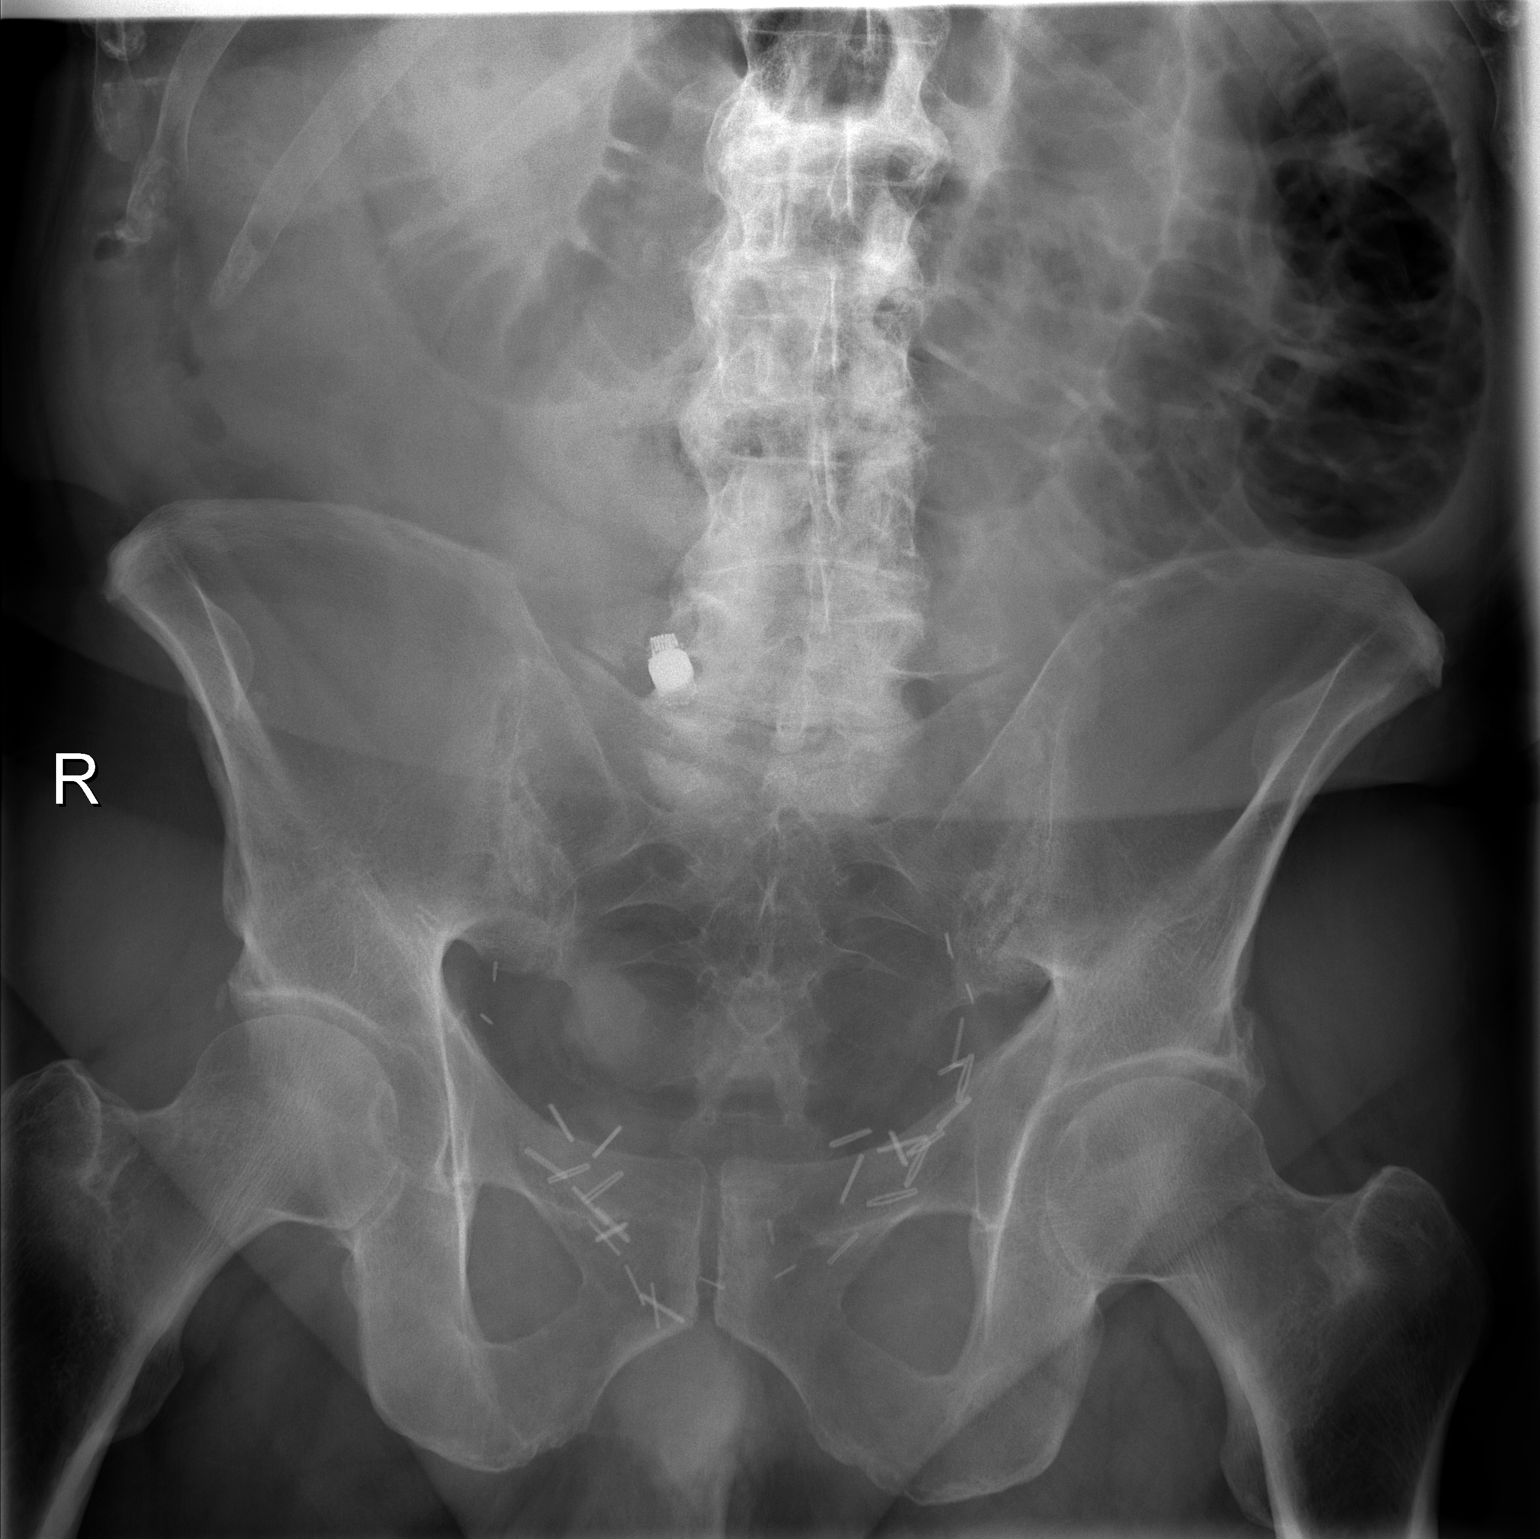

[2 of 2 positions shown; findings below may reference images not displayed]

FINDINGS: The capsule is in the right lower quadrant at the level of L5-S1.  Gas distended loops of small bowel in the mid abdomen.  The low true pelvis is not included on this view.  There is a 1.4 cm oval calcification in the right upper quadrant, of undetermined etiology.  Potentially this may represent a gallstone.
IMPRESSION: The capsule is in the right lower quadrant likely within the small bowel.  Gas distended small bowel loops ? ileus vs obstruction.  Right upper quadrant calcification ? query gallstone.

## 2008-07-10 IMAGING — CR DG ABDOMEN 1V
2 series · 2 of 2 positions shown · non-contrast
Comparison: none

CLINICAL DATA: Follow-up endoscopy capsule.
 ABDOMEN - 1 VIEW:

[view not recorded (1 of 2)]
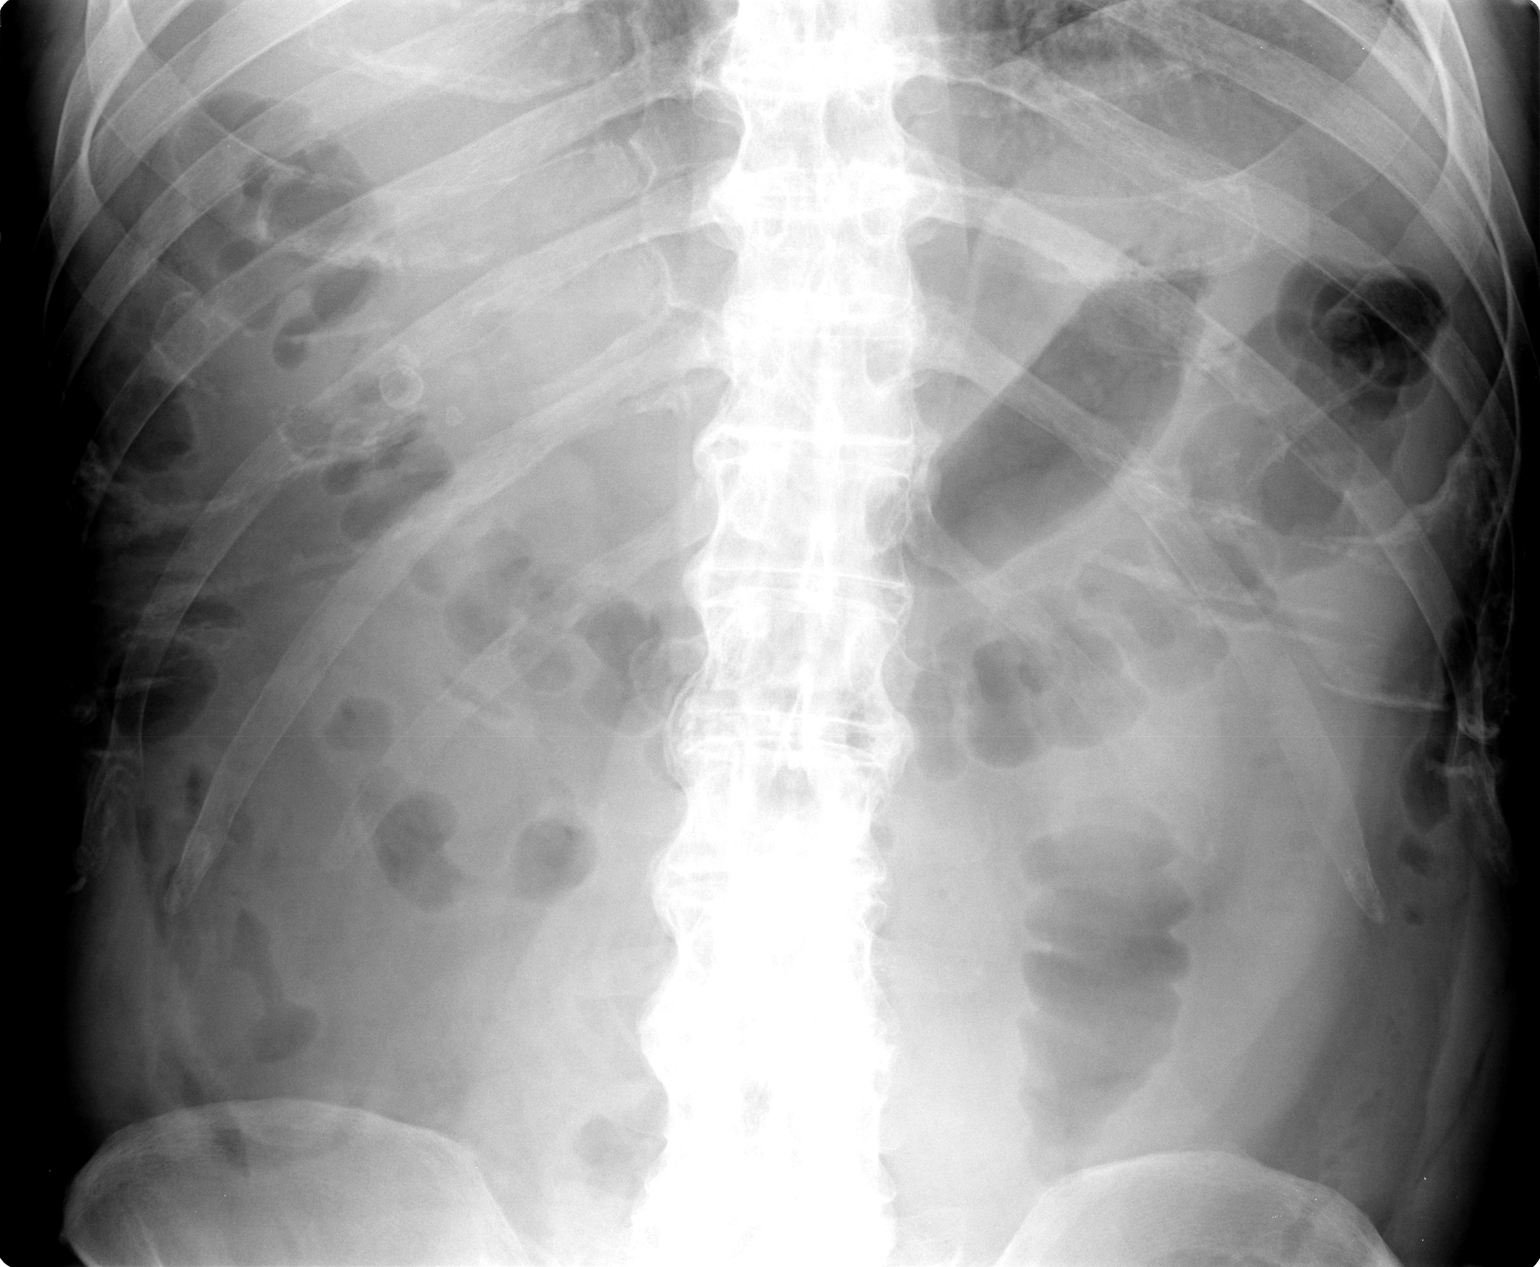

[view not recorded (2 of 2)]
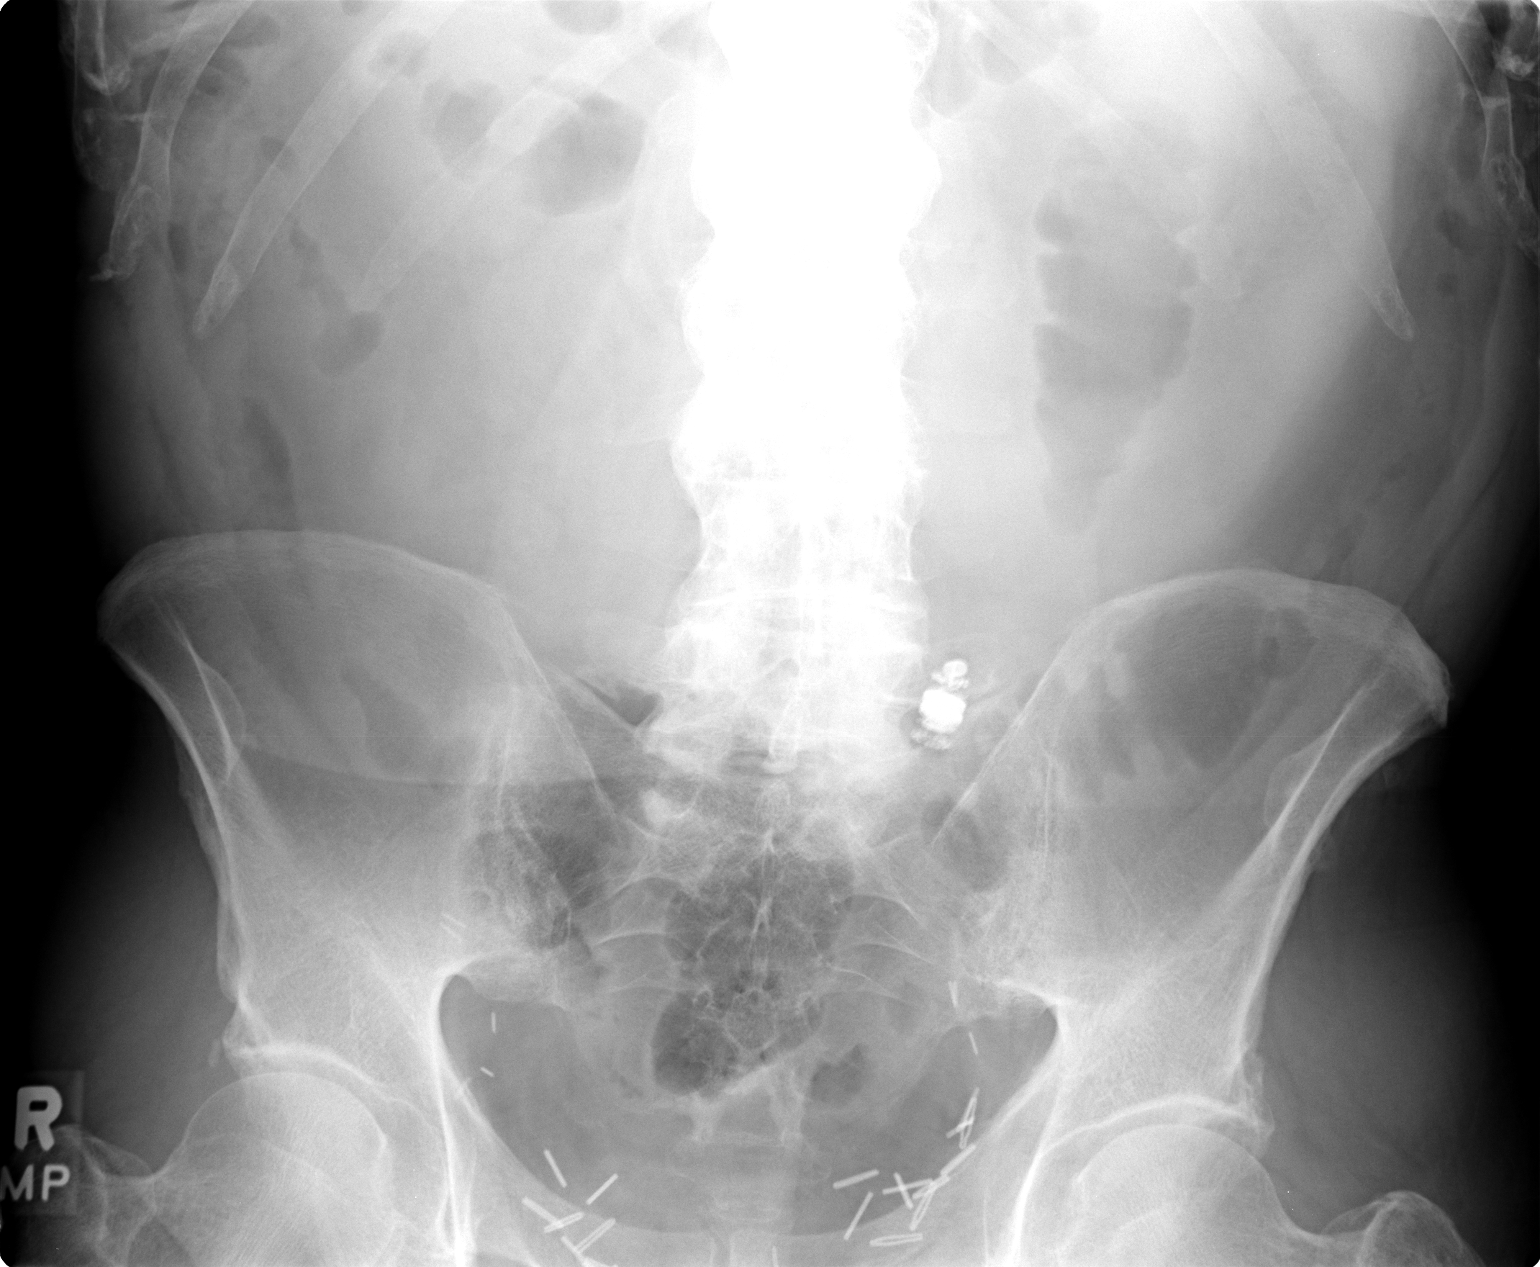

[2 of 2 positions shown; findings below may reference images not displayed]

FINDINGS: The metallic endoscopy capsule projects over the left lower quadrant, either in small bowel or sigmoid colon.  Dilated small bowel loops have improved.  Postoperative changes are noted.  The round calcifications in the right upper quadrant are stable.
IMPRESSION: Small bowel endoscopy capsule is in the left lower quadrant as described.  Improved small bowel distention.

## 2008-07-20 ENCOUNTER — Encounter: Payer: Self-pay | Admitting: Endocrinology

## 2008-08-23 ENCOUNTER — Encounter: Payer: Self-pay | Admitting: Endocrinology

## 2009-03-03 ENCOUNTER — Encounter: Payer: Self-pay | Admitting: Endocrinology

## 2009-04-18 ENCOUNTER — Ambulatory Visit: Payer: Self-pay | Admitting: Endocrinology

## 2009-04-18 ENCOUNTER — Telehealth (INDEPENDENT_AMBULATORY_CARE_PROVIDER_SITE_OTHER): Payer: Self-pay | Admitting: *Deleted

## 2009-04-18 ENCOUNTER — Telehealth: Payer: Self-pay | Admitting: Endocrinology

## 2009-05-08 IMAGING — CR DG ABD EXAM
2 series · 4 of 4 positions shown · non-contrast
Comparison: NONE

CLINICAL DATA: Severe pain. Evaluate for obstruction. 

ACUTE ABDOMEN SERIES

[view not recorded (1 of 2)]
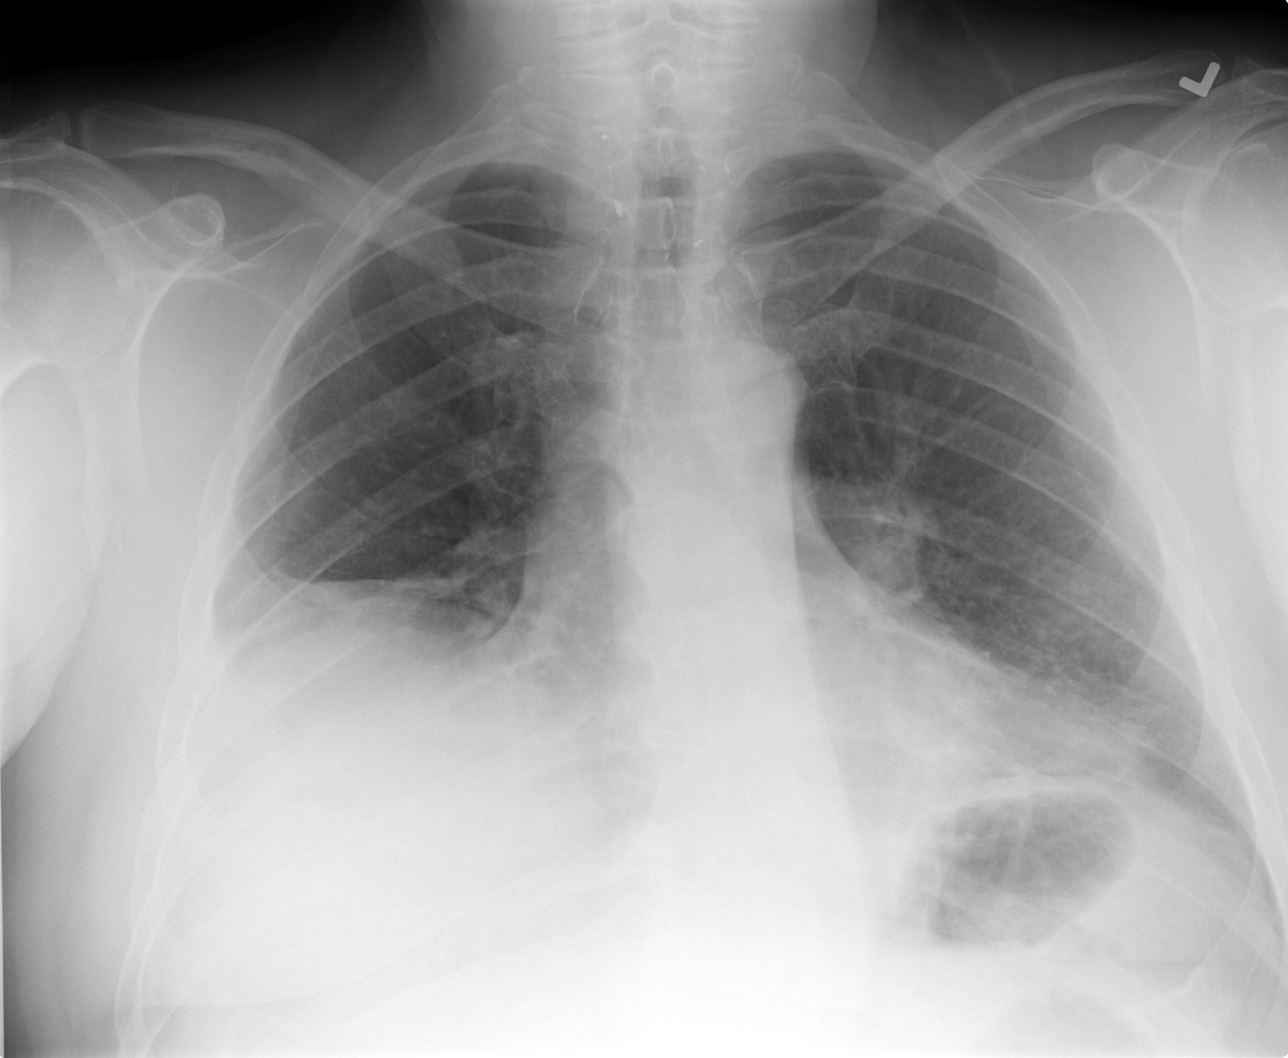

[Series 2: view not recorded · 0.17mm/px · 3 of 3 slices shown (2 of 2)]
[im 1/3]
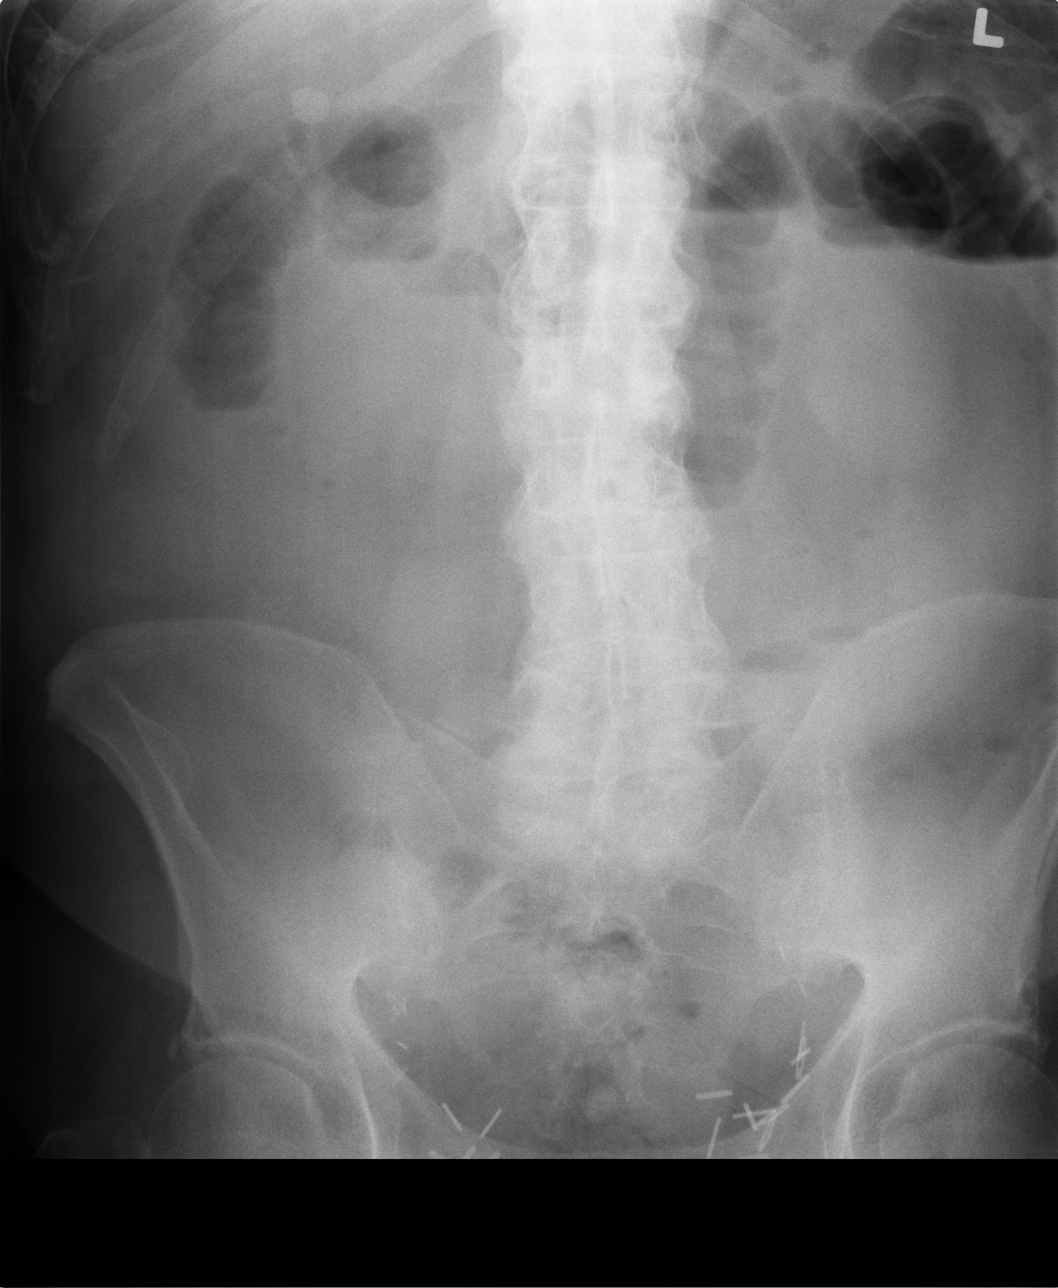
[im 2/3]
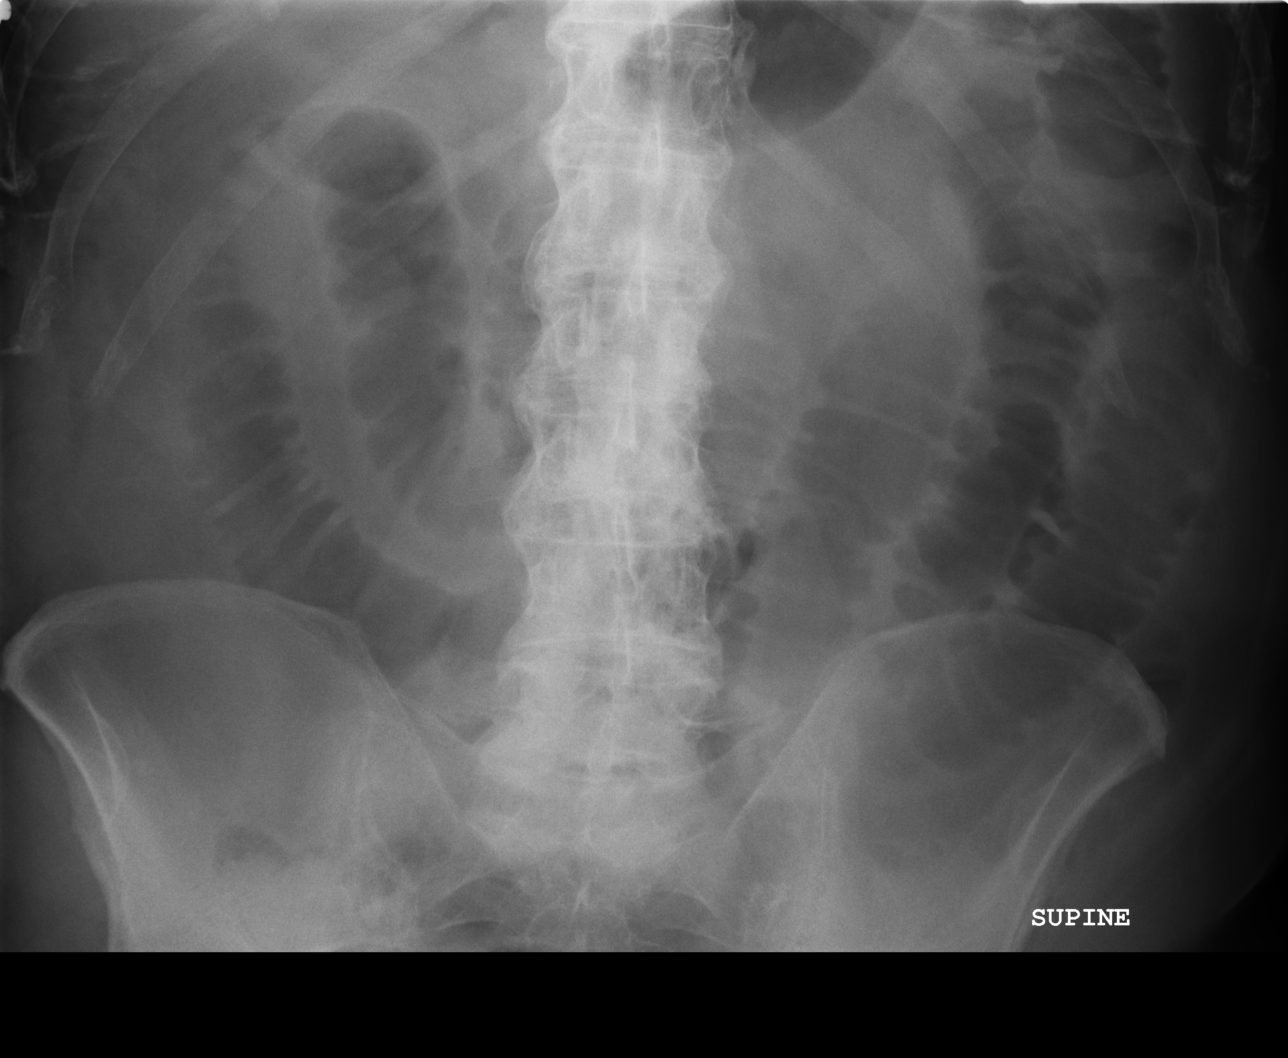
[im 3/3]
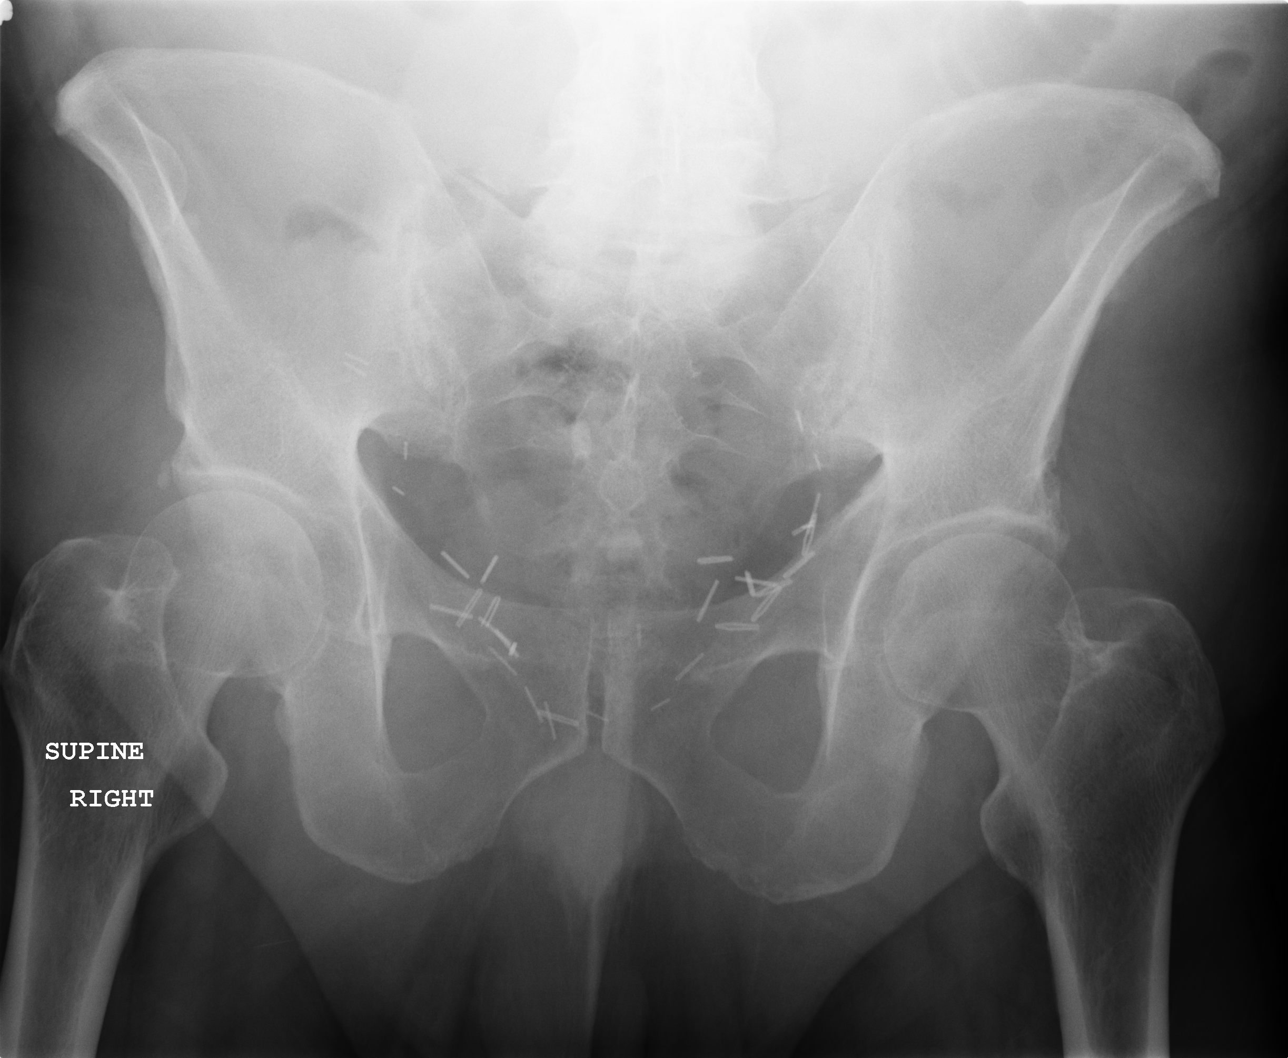

[4 of 4 positions shown; findings below may reference images not displayed]

FINDINGS: PA film of the chest demonstrates no evidence of free 
air under the diaphragm. There is increased density at the right 
base, which may reflect chronic elevation of the right 
hemidiaphragm with atelectasis or possibly compressive atelectasis 
in the right lower lobe. Supine and erect films of the abdomen 
demonstrate multiple air-fluid levels and dilated small bowel 
loops. Surgical clips are noted in the pelvis. Minimal air is 
noted in large bowel. 

electronically reviewed on 03/06/2007 Dict Date: 03/06/2007  Tran 
Date:  03/06/2007 DAS  [REDACTED]

## 2009-06-03 IMAGING — CR DG CHEST 2V
2 series · 2 of 2 positions shown · non-contrast
Comparison: 05/30/04.

CLINICAL DATA: Pre-op workup.  History of hypertension.  Crohn's disease.  
 CHEST - 2 VIEW:

[w chest pa]
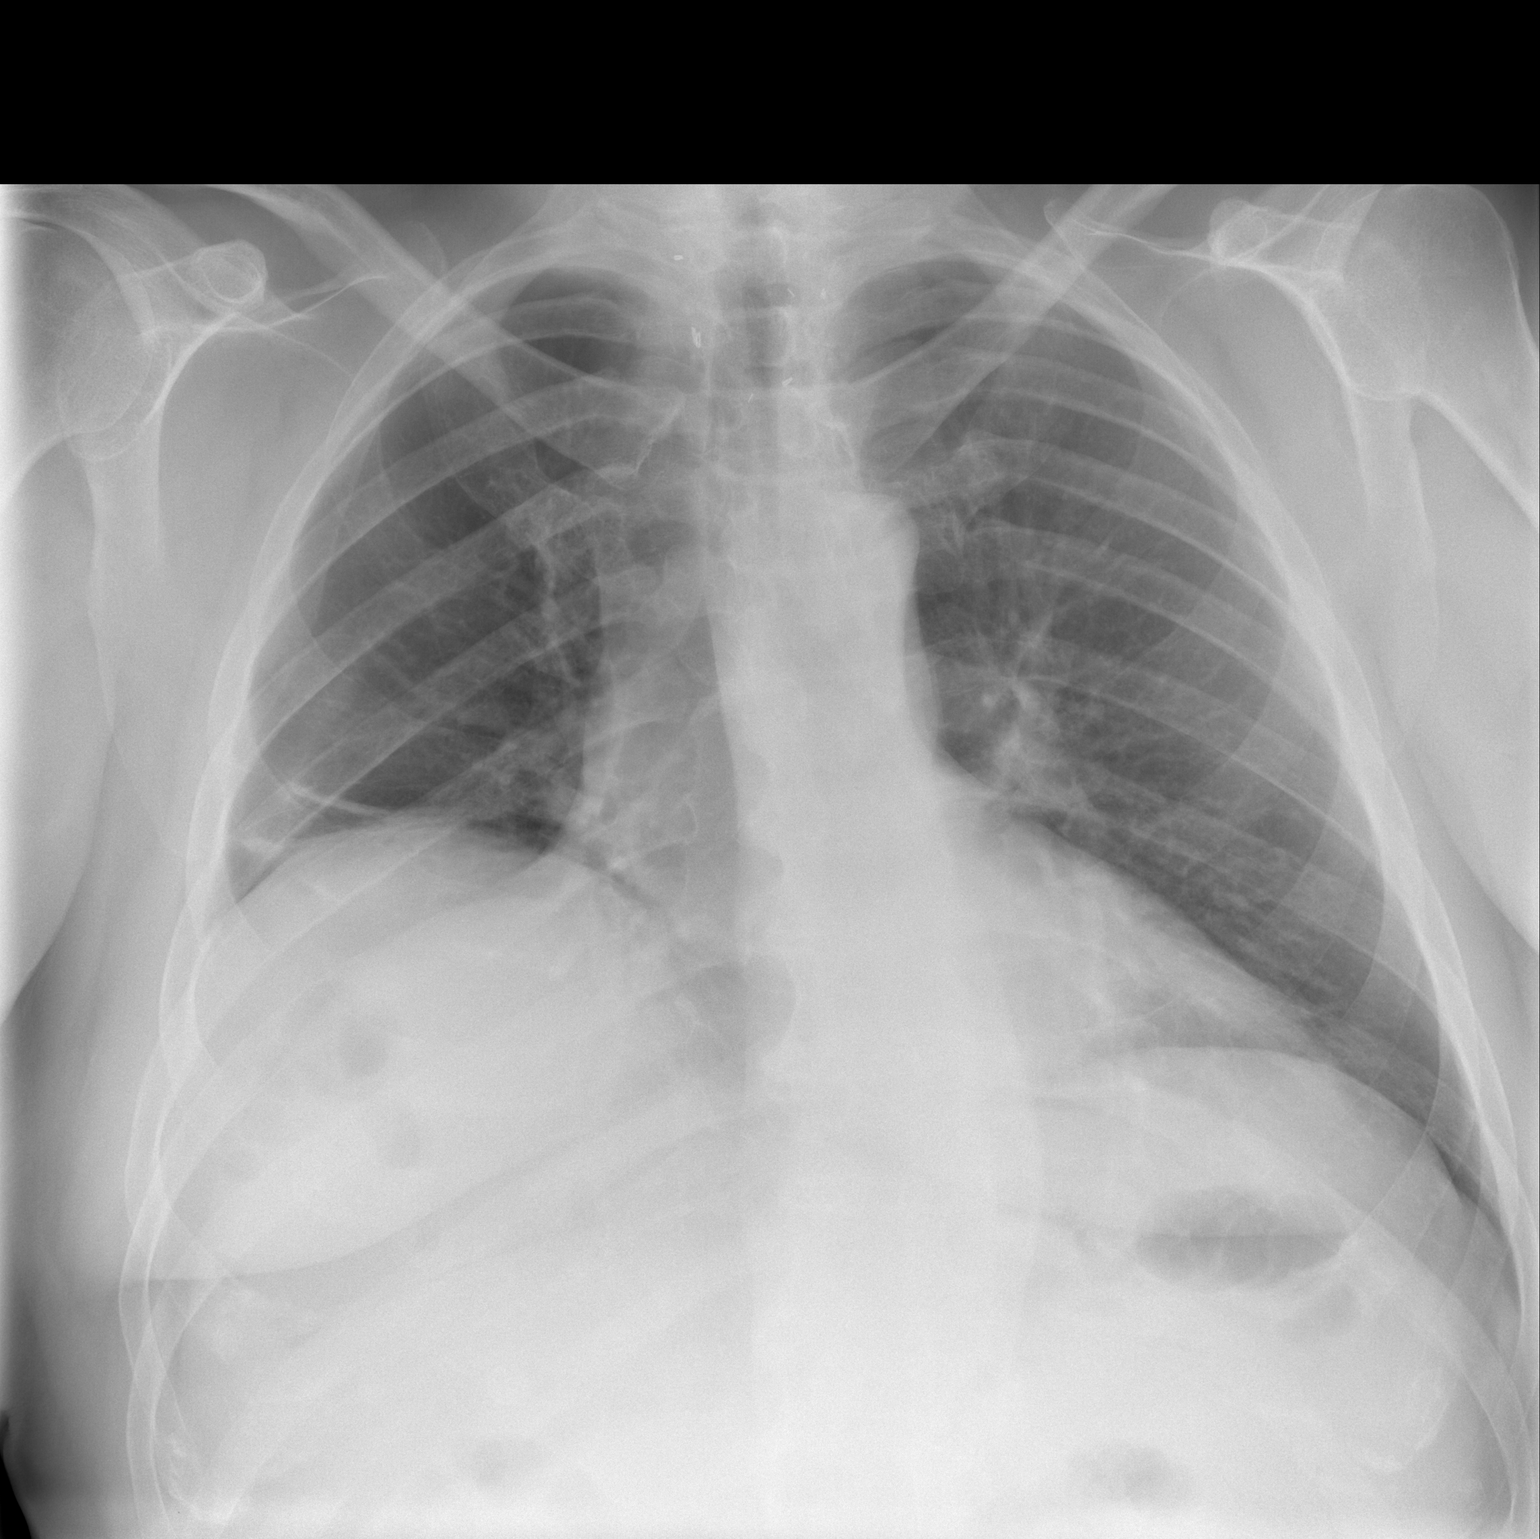

[w chest lat]
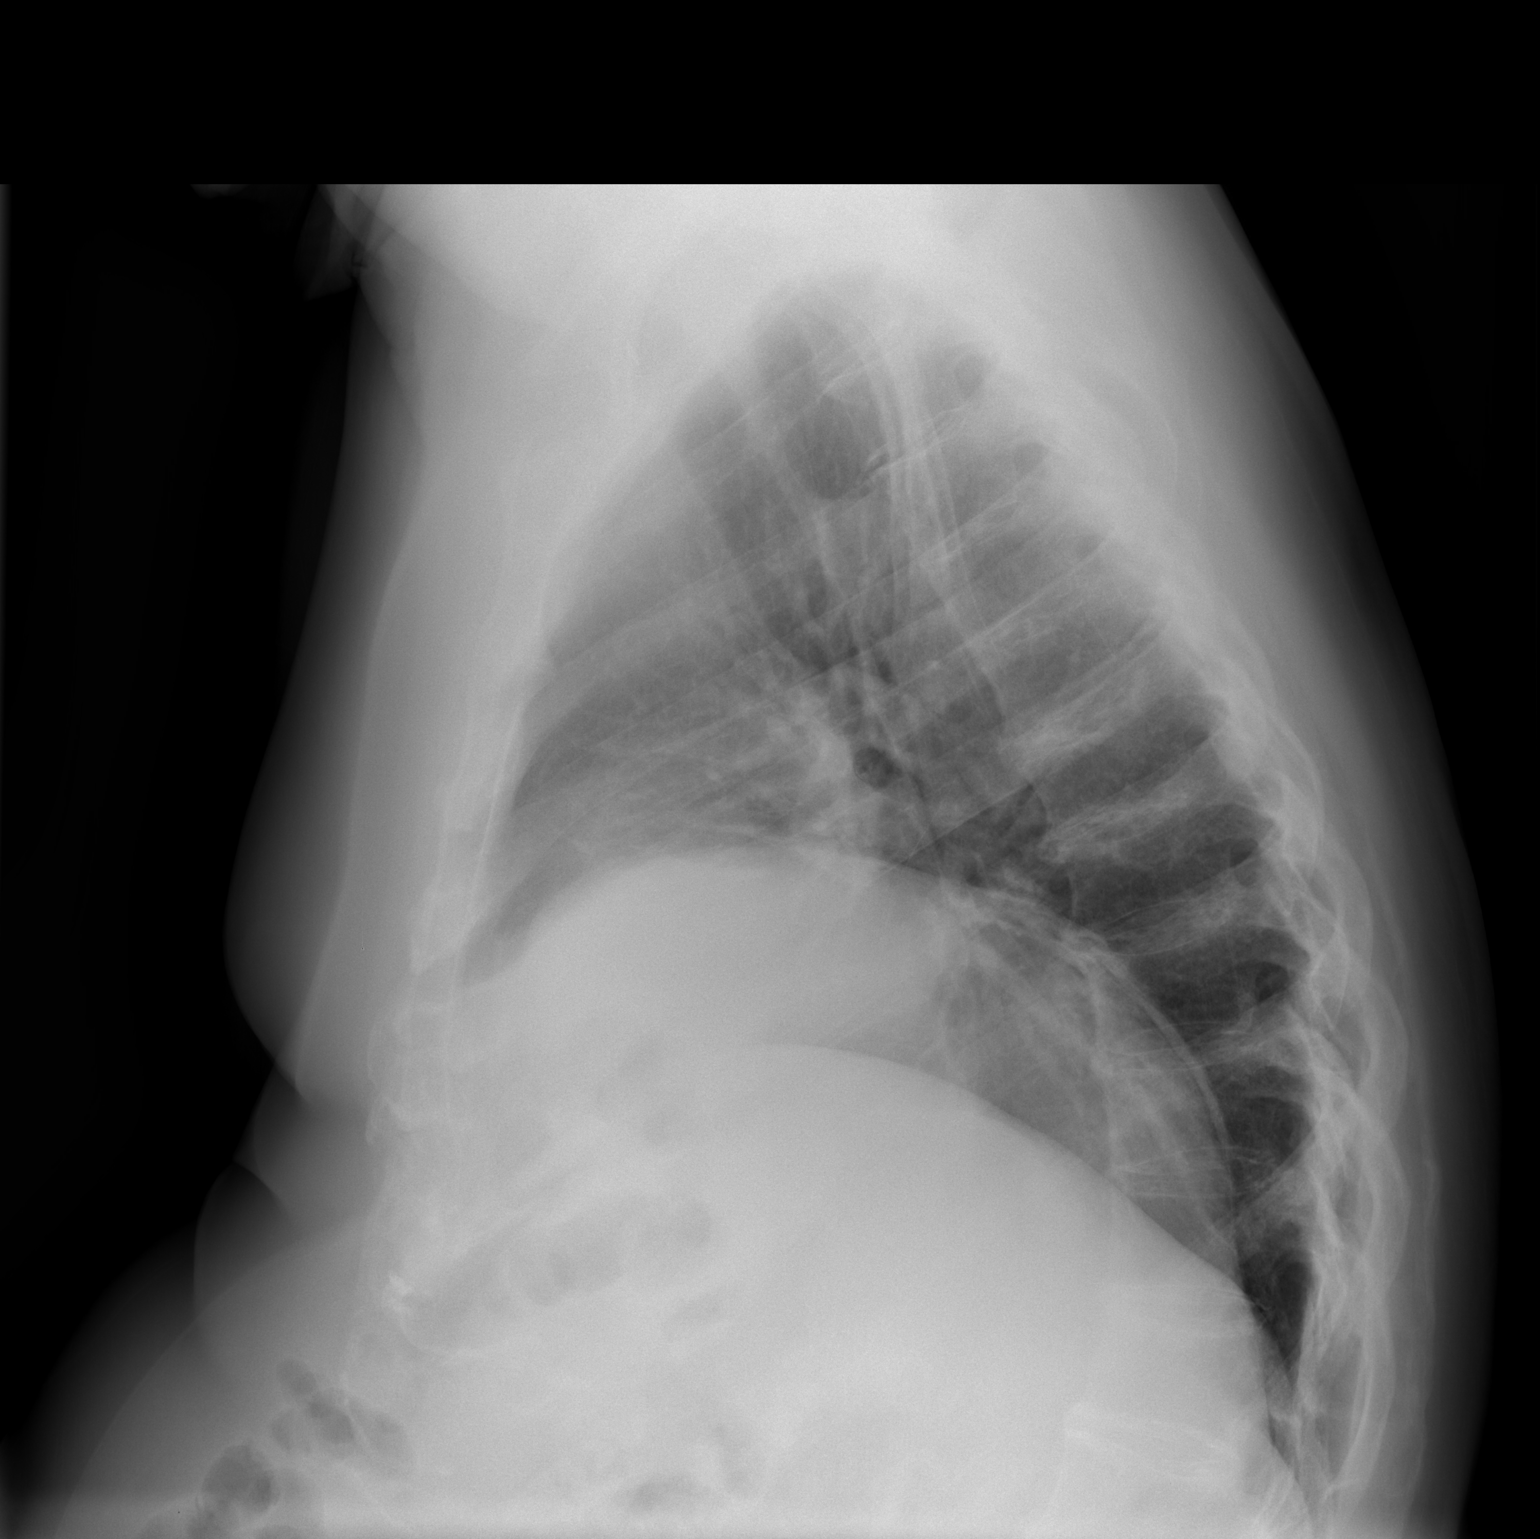

[2 of 2 positions shown; findings below may reference images not displayed]

FINDINGS: Again appreciated is elevated right hemidiaphragm.  This may be due to eventration or phrenic paresis.  Linear scarring right lung base again noted.  No active pulmonary process.  Mild thoracolumbar scoliosis convex to the left.  Normal cardiac size.  Small metallic surgical clips near the thoracic inlet.
IMPRESSION: No active chest disease.  See comments above.

## 2009-09-12 ENCOUNTER — Encounter: Payer: Self-pay | Admitting: Endocrinology

## 2009-10-16 ENCOUNTER — Encounter: Payer: Self-pay | Admitting: Endocrinology

## 2010-04-02 ENCOUNTER — Encounter: Payer: Self-pay | Admitting: Endocrinology

## 2010-04-23 ENCOUNTER — Encounter: Payer: Self-pay | Admitting: Endocrinology

## 2010-04-23 ENCOUNTER — Ambulatory Visit: Payer: Self-pay | Admitting: Endocrinology

## 2010-04-23 LAB — CONVERTED CEMR LAB
ALT: 33 units/L (ref 0–53)
AST: 33 units/L (ref 0–37)
Albumin: 4 g/dL (ref 3.5–5.2)
Alkaline Phosphatase: 75 units/L (ref 39–117)
BUN: 20 mg/dL (ref 6–23)
Basophils Absolute: 0 10*3/uL (ref 0.0–0.1)
Basophils Relative: 0 % (ref 0.0–3.0)
Bilirubin Urine: NEGATIVE
Bilirubin, Direct: 0.1 mg/dL (ref 0.0–0.3)
CO2: 28 meq/L (ref 19–32)
Calcium: 9.2 mg/dL (ref 8.4–10.5)
Chloride: 103 meq/L (ref 96–112)
Cholesterol: 108 mg/dL (ref 0–200)
Creatinine, Ser: 1.6 mg/dL — ABNORMAL HIGH (ref 0.4–1.5)
Creatinine,U: 173 mg/dL
Eosinophils Absolute: 0.2 10*3/uL (ref 0.0–0.7)
Eosinophils Relative: 1.9 % (ref 0.0–5.0)
GFR calc non Af Amer: 47.04 mL/min (ref >60)
Glucose, Bld: 164 mg/dL — ABNORMAL HIGH (ref 70–99)
HCT: 44.4 % (ref 39.0–52.0)
HDL: 40 mg/dL (ref >39.00)
Hemoglobin, Urine: NEGATIVE
Hemoglobin: 15.4 g/dL (ref 13.0–17.0)
Hgb A1c MFr Bld: 7 % — ABNORMAL HIGH (ref 4.6–6.5)
Ketones, ur: NEGATIVE mg/dL
LDL Cholesterol: 49 mg/dL (ref 0–99)
Leukocytes, UA: NEGATIVE
Lymphocytes Relative: 15.7 % (ref 12.0–46.0)
Lymphs Abs: 1.8 10*3/uL (ref 0.7–4.0)
MCHC: 34.7 g/dL (ref 30.0–36.0)
MCV: 91.2 fL (ref 78.0–100.0)
Microalb Creat Ratio: 0.2 mg/g (ref 0.0–30.0)
Microalb, Ur: 0.4 mg/dL (ref 0.0–1.9)
Monocytes Absolute: 0.8 10*3/uL (ref 0.1–1.0)
Monocytes Relative: 7.1 % (ref 3.0–12.0)
Neutro Abs: 8.8 10*3/uL — ABNORMAL HIGH (ref 1.4–7.7)
Neutrophils Relative %: 75.3 % (ref 43.0–77.0)
Nitrite: NEGATIVE
PSA: 1.39 ng/mL (ref 0.10–4.00)
Platelets: 261 10*3/uL (ref 150.0–400.0)
Potassium: 4.2 meq/L (ref 3.5–5.1)
RBC: 4.87 M/uL (ref 4.22–5.81)
RDW: 13.3 % (ref 11.5–14.6)
Sodium: 138 meq/L (ref 135–145)
Specific Gravity, Urine: 1.025 (ref 1.000–1.030)
TSH: 0.12 microintl units/mL — ABNORMAL LOW (ref 0.35–5.50)
Thyroglobulin Ab: <20 (ref <40.0)
Total Bilirubin: 0.7 mg/dL (ref 0.3–1.2)
Total CHOL/HDL Ratio: 3
Total Protein, Urine: NEGATIVE mg/dL
Total Protein: 6.3 g/dL (ref 6.0–8.3)
Triglycerides: 93 mg/dL (ref 0.0–149.0)
Uric Acid, Serum: 7.1 mg/dL (ref 4.0–7.8)
Urine Glucose: NEGATIVE mg/dL
Urobilinogen, UA: 0.2 (ref 0.0–1.0)
VLDL: 18.6 mg/dL (ref 0.0–40.0)
WBC: 11.7 10*3/uL — ABNORMAL HIGH (ref 4.5–10.5)
pH: 5.5 (ref 5.0–8.0)

## 2010-05-01 ENCOUNTER — Encounter: Payer: Self-pay | Admitting: Endocrinology

## 2010-05-01 ENCOUNTER — Ambulatory Visit: Payer: Self-pay | Admitting: Family Medicine

## 2010-05-11 ENCOUNTER — Telehealth: Payer: Self-pay | Admitting: Endocrinology

## 2010-07-08 LAB — CONVERTED CEMR LAB
BUN: 13 mg/dL (ref 6–23)
BUN: 14 mg/dL (ref 6–23)
Bacteria, UA: NEGATIVE
Basophils Absolute: 0 10*3/uL (ref 0.0–0.1)
Basophils Absolute: 0 10*3/uL (ref 0.0–0.1)
Basophils Relative: 0.3 % (ref 0.0–3.0)
Basophils Relative: 0.4 % (ref 0.0–3.0)
Bilirubin Urine: NEGATIVE
Bilirubin Urine: NEGATIVE
CO2: 28 meq/L (ref 19–32)
CO2: 29 meq/L (ref 19–32)
Calcium, Total (PTH): 9.8 mg/dL (ref 8.4–10.5)
Calcium: 9.3 mg/dL (ref 8.4–10.5)
Calcium: 9.5 mg/dL (ref 8.4–10.5)
Chloride: 104 meq/L (ref 96–112)
Chloride: 111 meq/L (ref 96–112)
Cholesterol: 102 mg/dL (ref 0–200)
Cholesterol: 103 mg/dL (ref 0–200)
Creatinine, Ser: 1.4 mg/dL (ref 0.4–1.5)
Creatinine, Ser: 1.5 mg/dL (ref 0.4–1.5)
Creatinine,U: 122.4 mg/dL
Creatinine,U: 145.4 mg/dL
Crystals: NEGATIVE
Eosinophils Absolute: 0.2 10*3/uL (ref 0.0–0.7)
Eosinophils Absolute: 0.2 10*3/uL (ref 0.0–0.7)
Eosinophils Relative: 1.7 % (ref 0.0–5.0)
Eosinophils Relative: 2 % (ref 0.0–5.0)
GFR calc Af Amer: 65 mL/min
GFR calc non Af Amer: 49.73 mL/min (ref >60)
GFR calc non Af Amer: 54 mL/min
Glucose, Bld: 163 mg/dL — ABNORMAL HIGH (ref 70–99)
Glucose, Bld: 74 mg/dL (ref 70–99)
HCT: 43.3 % (ref 39.0–52.0)
HCT: 43.7 % (ref 39.0–52.0)
HDL: 37.7 mg/dL — ABNORMAL LOW (ref >39.0)
HDL: 43.1 mg/dL (ref >39.00)
Hemoglobin, Urine: NEGATIVE
Hemoglobin, Urine: NEGATIVE
Hemoglobin: 15.1 g/dL (ref 13.0–17.0)
Hemoglobin: 15.3 g/dL (ref 13.0–17.0)
Hgb A1c MFr Bld: 6.1 % (ref 4.6–6.5)
Hgb A1c MFr Bld: 6.4 % — ABNORMAL HIGH (ref 4.6–6.0)
Ketones, ur: NEGATIVE mg/dL
Ketones, ur: NEGATIVE mg/dL
LDL Cholesterol: 30 mg/dL (ref 0–99)
LDL Cholesterol: 40 mg/dL (ref 0–99)
Leukocytes, UA: NEGATIVE
Leukocytes, UA: NEGATIVE
Lymphocytes Relative: 14 % (ref 12.0–46.0)
Lymphocytes Relative: 15.3 % (ref 12.0–46.0)
Lymphs Abs: 1.7 10*3/uL (ref 0.7–4.0)
MCHC: 34.8 g/dL (ref 30.0–36.0)
MCHC: 35 g/dL (ref 30.0–36.0)
MCV: 87.3 fL (ref 78.0–100.0)
MCV: 90.2 fL (ref 78.0–100.0)
Microalb Creat Ratio: 1.6 mg/g (ref 0.0–30.0)
Microalb Creat Ratio: 5.5 mg/g (ref 0.0–30.0)
Microalb, Ur: 0.2 mg/dL (ref 0.0–1.9)
Microalb, Ur: 0.8 mg/dL (ref 0.0–1.9)
Monocytes Absolute: 0.6 10*3/uL (ref 0.1–1.0)
Monocytes Absolute: 0.9 10*3/uL (ref 0.1–1.0)
Monocytes Relative: 6.8 % (ref 3.0–12.0)
Monocytes Relative: 8.2 % (ref 3.0–12.0)
Neutro Abs: 7.2 10*3/uL (ref 1.4–7.7)
Neutro Abs: 8 10*3/uL — ABNORMAL HIGH (ref 1.4–7.7)
Neutrophils Relative %: 74.4 % (ref 43.0–77.0)
Neutrophils Relative %: 76.9 % (ref 43.0–77.0)
Nitrite: NEGATIVE
Nitrite: NEGATIVE
PSA: 0.45 ng/mL (ref 0.10–4.00)
PSA: 1.23 ng/mL (ref 0.10–4.00)
PTH: 26.7 pg/mL (ref 14.0–72.0)
Platelets: 243 10*3/uL (ref 150–400)
Platelets: 258 10*3/uL (ref 150.0–400.0)
Potassium: 3.8 meq/L (ref 3.5–5.1)
Potassium: 3.9 meq/L (ref 3.5–5.1)
RBC / HPF: NONE SEEN
RBC: 4.8 M/uL (ref 4.22–5.81)
RBC: 5.01 M/uL (ref 4.22–5.81)
RDW: 12.5 % (ref 11.5–14.6)
RDW: 13.1 % (ref 11.5–14.6)
Sodium: 141 meq/L (ref 135–145)
Sodium: 146 meq/L — ABNORMAL HIGH (ref 135–145)
Specific Gravity, Urine: 1.02 (ref 1.000–1.030)
Specific Gravity, Urine: 1.025 (ref 1.000–1.03)
Squamous Epithelial / LPF: NEGATIVE /lpf
TSH: 0.09 microintl units/mL — ABNORMAL LOW (ref 0.35–5.50)
TSH: 0.16 microintl units/mL — ABNORMAL LOW (ref 0.35–5.50)
Total CHOL/HDL Ratio: 2
Total CHOL/HDL Ratio: 2.7
Total Protein, Urine: NEGATIVE mg/dL
Total Protein, Urine: NEGATIVE mg/dL
Triglycerides: 127 mg/dL (ref 0–149)
Triglycerides: 143 mg/dL (ref 0.0–149.0)
Uric Acid, Serum: 6.9 mg/dL (ref 4.0–7.8)
Uric Acid, Serum: 7.1 mg/dL (ref 4.0–7.8)
Urine Glucose: NEGATIVE mg/dL
Urine Glucose: NEGATIVE mg/dL
Urobilinogen, UA: 0.2 (ref 0.0–1.0)
Urobilinogen, UA: 0.2 (ref 0.0–1.0)
VLDL: 25 mg/dL (ref 0–40)
VLDL: 28.6 mg/dL (ref 0.0–40.0)
WBC: 10.8 10*3/uL — ABNORMAL HIGH (ref 4.5–10.5)
WBC: 9.3 10*3/uL (ref 4.5–10.5)
pH: 5.5 (ref 5.0–8.0)
pH: 6 (ref 5.0–8.0)

## 2010-07-10 NOTE — Progress Notes (Signed)
Summary: Devin [name]  Medications Added NIASPAN 500 MG  TBCR (NIACIN (ANTIHYPERLIPIDEMIC)) qhs       Phone Note Call from Patient   Summary of Call: Devin HAVE [name]:1.MEDICATIONS HOW CAN HE GET RX REFILL-Devin IS HAV'G SMALL INTESTINE SURGERY NXT WEEK-Devin HAVE OTHER [name] .  CALL Devin AT [phone] Initial call taken by: Ivar Bury,  March 26, 2007 3:26 PM  Follow-up for Phone Call        normally we do not do phone consultations - please be specific about which meds are needed, and any simply question that might be needed, o/w consider ROV  Additional Follow-up for Phone Call Additional follow up Details #1::        called Devin on 03-27-07 , Devin stated he may not be able to make appt nov 3 still want meds to be called in  Additional Follow-up by: Shelbie Proctor,  March 27, 2007 8:46 AM    Additional Follow-up for Phone Call Additional follow up Details #2::    i need to know what meds are requested 04/01/07 called Devin to inform leftmessage to call 04/02/07 called Devin again to inform left msg to call our office back//04-02-07 Devin wife made aware Devin in hospital had surgery  Follow-up by: Shelbie Proctor,  April 01, 2007 4:17 PM  Additional Follow-up for Phone Call Additional follow up Details #3:: Details for Additional Follow-up Action Taken: Devin WANT HIS MED REFILL NIASPAN,ATENOLOL LEVOXYL,NAPROXEN,LISINOPRIL ALSO Devin MAY NOT BE ABLE TO MAKE APPT NOV 3 DUE TO HIS SURGERY HE IS HAVING. PLEASE SEND RX TO [facility] [phone] [id number] PER Devin  04/01/07--i have refilled all except for the naprosyn, which our record say patient is no longer taking.  i advise against this medication. [email or web address], md Additional Follow-up by: Shelbie Proctor,  March 31, 2007 9:31 AM  New/Updated Medications: NIASPAN 500 MG  TBCR (NIACIN (ANTIHYPERLIPIDEMIC)) qhs   Prescriptions: NIASPAN 500 MG  TBCR (NIACIN (ANTIHYPERLIPIDEMIC)) qhs  #30 x 11  Entered and Authorized by:   Minus Breeding MD   Signed by:   Minus Breeding MD on 04/01/2007   Method used:   Electronically sent to ...       [facility] [phone]       [address]       [place], [place]  [address]       Ph: [phone] or [phone]       Fax: [phone]   RxID:   [id number] LEVOXYL 200 MCG  TABS (LEVOTHYROXINE SODIUM) TAKE1 by mouth QD  #30 x 11   Entered and Authorized by:   Minus Breeding MD   Signed by:   Minus Breeding MD on 04/01/2007   Method used:   Electronically sent to ...       [facility] [phone] [facility] [address]       [address]       [place], [place]  [address]       Ph: [phone] or [phone]       Fax: [phone]   RxID:   [id number] ZESTORETIC 20-12.5 MG  TABS (LISINOPRIL-HYDROCHLOROTHIAZIDE) TAKE 1 by mouth QD  #30 x 11   Entered and Authorized by:   Minus Breeding MD   Signed by:   Minus Breeding MD on 04/01/2007   Method  used:   Electronically sent to ...       [facility] [phone]       [address]       [place], [place]  [address]       Ph: [phone] or [phone]       Fax: [phone]   RxID:   [id number] TENORMIN 25 MG  TABS (ATENOLOL) TAKE 1/2 TAB QD  #15 x 11   Entered and Authorized by:   Minus Breeding MD   Signed by:   Minus Breeding MD on 04/01/2007   Method used:   Electronically sent to ...       [facility] [phone]       [address]       [place], [place]  [address]       Ph: [phone] or [phone]       Fax: [phone]   RxID:   [id number]

## 2010-07-10 NOTE — Progress Notes (Signed)
Summary: rx refill req  Phone Note Refill Request Message from:  Fax from Pharmacy on May 11, 2010 8:41 AM  Refills Requested: Medication #1:  SYNTHROID 200 MCG TABS 1 once daily.   Dosage confirmed as above?Dosage Confirmed Pt request 90 day supply   Method Requested: Electronic Next Appointment Scheduled: none Initial call taken by: Brenton Grills CMA Duncan Dull),  May 11, 2010 8:42 AM    Prescriptions: SYNTHROID 200 MCG TABS (LEVOTHYROXINE SODIUM) 1 once daily  #90 x 1   Entered by:   Brenton Grills CMA (AAMA)   Authorized by:   Minus Breeding MD   Signed by:   Brenton Grills CMA (AAMA) on 05/11/2010   Method used:   Electronically to        [facility] [address] [phone]* (retail)       [address]       [place], [place]  [address]       Ph: [phone] or [phone]       Fax: [phone]   RxID:   [id number]

## 2010-07-10 NOTE — Miscellaneous (Signed)
Summary: BONE DENSITY  Clinical Lists Changes  Orders: Added new Test order of T-Bone Densitometry (77080) - Signed Added new Test order of T-Lumbar Vertebral Assessment (77082) - Signed 

## 2010-07-10 NOTE — Assessment & Plan Note (Signed)
Summary: YEARLY--STC   Vital Signs:  Patient profile:   70 year old male Height:      71 inches (180.34 cm) Weight:      237 pounds (107.73 kg) BMI:     33.17 O2 Sat:      96 % on Room air Temp:     97.4 degrees F (36.33 degrees C) oral Pulse rate:   77 / minute BP sitting:   116 / 82  (left arm) Cuff size:   large  Vitals Entered By: Josph Macho CMA (April 18, 2009 8:27 AM)  O2 Flow:  Room air CC: Physical/ pt doesn't want flu vax/ CF Is Patient Diabetic? Yes   CC:  Physical/ pt doesn't want flu vax/ CF.  History of Present Illness: pt does not notice any nodule at the neck. he takes synthroid as rx'ed. no cbg record, but states cbg's are well-controlled.  it is highest in am (low to mid-100's). he takes bp meds as rx'ed.  Current Medications (verified): 1)  Tenormin 25 Mg  Tabs (Atenolol) .... Take 1/2 Tab Qd 2)  Zestoretic 20-12.5 Mg  Tabs (Lisinopril-Hydrochlorothiazide) .... Take 1 By Mouth Qd 3)  Humalog Pen 100 Unit/ml  Soln (Insulin Lispro (Human)) .... Qac Three Times A Day) 25-25-30 Units 4)  Buspar 15 Mg  Tabs (Buspirone Hcl) .... Take 1 By Mouth Bid 5)  Cialis 20 Mg  Tabs (Tadalafil) .... Take Prn 6)  Ascensia Contour Test  Strp (Glucose Blood) .... Bid 7)  Bd U/f Iii Short Pen Needle 31g X 8 Mm Misc (Insulin Pen Needle) .... Qid 8)  Niaspan 500 Mg  Tbcr (Niacin (Antihyperlipidemic)) .... Qhs 9)  Humulin N Pen 100 Unit/ml  Susp (Insulin Isophane Human) .... 20 Units Qhs 10)  Synthroid 112 Mcg  Tabs (Levothyroxine Sodium) .... 2 Qd 11)  Asacol 400 Mg Tbec (Mesalamine) .... 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    Physical Exam  General:  normal appearance.   Head:  head: no deformity eyes: no periorbital swelling, no proptosis external nose and ears are normal mouth: no lesion seen Neck:  a healed scar is present.  i do not appreciate a nodule in the thyroid or elsewhere in the neck  Lungs:  Clear to auscultation bilaterally. Normal respiratory effort.  Heart:  Regular rate and rhythm without murmurs or gallops noted. Normal S1,S2.   Abdomen:  abdomen is soft, nontender.  no hepatosplenomegaly.   not distended.  there is a self-reducing ventral herniahernia there are several healed surgical scars Rectal:  pt seees gi Prostate:  (pt has been declared cured of prostate cancer) Msk:  muscle bulk and strength are grossly normal.  no obvious joint swelling.  gait is normal and steady  Pulses:  dorsalis pedis intact bilat.  no carotid bruit  Extremities:  no deformity.  no ulcer on the feet.  feet are of  normal color and temp.  1+ right pedal edema and 1+ left pedal edema.   Neurologic:  cn 2-12 grossly intact.   readily moves all 4's.   sensation is intact to touch on the feet  Cervical Nodes:  No significant adenopathy.  Psych:  Alert and cooperative; normal mood and affect; normal attention span and concentration.   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 Qac three times a day) 20-20-25 units 2)  Zostavax 16109 Unt/0.71ml Solr (Zoster vaccine live) .... We will administer 3)  Synthroid 200 Mcg Tabs (Levothyroxine sodium) .Marland Kitchen.. 1 qd  Other Orders: T-Thyroglobulin Panel ([address], [phone]) T-Parathyroid Hormone, Intact w/ Calcium (29562-13086) EKG w/ Interpretation (93000) Pneumococcal Vaccine (57846) Admin 1st Vaccine (96295) TLB-Lipid Panel (80061-LIPID) TLB-BMP (Basic Metabolic Panel-BMET) (80048-METABOL) TLB-CBC Platelet - w/Differential (85025-CBCD) TLB-TSH (Thyroid Stimulating Hormone) (84443-TSH) TLB-A1C / Hgb A1C (Glycohemoglobin) (83036-A1C) TLB-Microalbumin/Creat Ratio, Urine (82043-MALB) TLB-PSA (Prostate Specific Antigen) (84153-PSA) TLB-Udip w/ Micro (81001-URINE) TLB-Uric Acid, Blood (84550-URIC) Est. Patient Level IV (28413)  Patient Instructions: 1)  tests are being ordered for you today.  a few days after the test(s), please call [phone] to hear your test results. 2)  Please schedule a follow-up appointment in 6 months. 3)  (update: i left message on phone-tree:  rx as we discussed) 4)  reduce humalog to (just before each meal) 20-20-25 units.  reduce synthroid to 200 micrograms/day.  stop niaspan) Prescriptions: SYNTHROID 200 MCG TABS (LEVOTHYROXINE SODIUM) 1 qd  #30 x 11   Entered and Authorized by:   Minus Breeding MD   Signed by:   Minus Breeding MD on 04/18/2009   Method used:   Electronically to        [facility] [address] [phone]* (retail)       [address]       [place], [place]  [address]       Ph: [phone] or [phone]       Fax: [phone]   RxID:   [id number] ZOSTAVAX 19400  UNT/0.65ML SOLR (ZOSTER VACCINE LIVE) we will administer  #1 adult dose x 0   Entered and Authorized by:   Minus Breeding MD   Signed by:   Minus Breeding MD on 04/18/2009   Method used:   Electronically to        [facility] [address] [phone]* (retail)       [address]       [place], [place]  [address]       Ph: [phone] or [phone]       Fax: [phone]   RxID:   [id number]    Immunizations Administered:  Pneumonia Vaccine:    Vaccine Type: Pneumovax (Medicare)    Site: left deltoid    Mfr: Merck    Dose: 0.5 ml    Route: IM    Given by: Josph Macho CMA    Exp.  Date: 05/30/2010    Lot #: [id number]    VIS given: 01/06/96 version given April 18, 2009.

## 2010-07-10 NOTE — Progress Notes (Signed)
[facility]   Imported By: Esmeralda Links D'jimraou 12/07/2007 15:28:54  _____________________________________________________________________  External Attachment:    Type:   Image     Comment:   External Document

## 2010-07-10 NOTE — Letter (Signed)
Summary: [facility]   Imported By: Lester Pinos Altos 09/02/2008 09:48:56  _____________________________________________________________________  External Attachment:    Type:   Image     Comment:   External Document

## 2010-07-10 NOTE — Assessment & Plan Note (Signed)
Summary: yearly check up/medicare/$50/pn   Vital Signs:  Patient Profile:   70 Years Old Male Weight:      245.4 pounds O2 Sat:      96 % O2 treatment:    Room Air Temp:     97.6 degrees F oral Pulse rate:   74 / minute BP sitting:   122 / 64  (left arm) Cuff size:   large  Pt. in pain?   no  Vitals Entered By: Orlan Leavens (April 14, 2008 8:17 AM)                  Chief Complaint:  YEARLY CHECK-UP.  History of Present Illness: pt does not notice any nodule at the neck takes bp meds as rx'ed pt says his cbg's are well-controlled. no recent gout sxs on fosamax x many years stopped naprosyn on advice of dr Kinnie Scales.  no oa sxs since     Prior Medications Reviewed Using: List Brought by Patient  Updated Prior Medication List: TENORMIN 25 MG  TABS (ATENOLOL) TAKE 1/2 TAB QD ZESTORETIC 20-12.5 MG  TABS (LISINOPRIL-HYDROCHLOROTHIAZIDE) TAKE 1 by mouth QD HUMALOG PEN 100 UNIT/ML  SOLN (INSULIN LISPRO (HUMAN)) SEE OFFICE NOTES BUSPAR 15 MG  TABS (BUSPIRONE HCL) TAKE 1 by mouth BID CIALIS 20 MG  TABS (TADALAFIL) TAKE PRN ASCENSIA CONTOUR TEST  STRP (GLUCOSE BLOOD)  BD U/F III SHORT PEN NEEDLE 31G X 8 MM MISC (INSULIN PEN NEEDLE) qid NIASPAN 500 MG  TBCR (NIACIN (ANTIHYPERLIPIDEMIC)) qhs HUMULIN N PEN 100 UNIT/ML  SUSP (INSULIN ISOPHANE HUMAN) 20 units qhs NAPROXEN DR 500 MG  TBEC (NAPROXEN) qd SYNTHROID 112 MCG  TABS (LEVOTHYROXINE SODIUM) 2 qd FOSAMAX 35 MG TABS (ALENDRONATE SODIUM) TAKE 1 Q WEEK ASACOL 400 MG TBEC (MESALAMINE) TAKE 4 two times a day    Past Surgical History:    Thyroidectomy    Tonsillectomy    Cardiac Catherization (03/17/2003)    Stress Cardiolite (02/17/2003)    Venous Doppler (05/30/2004)    EKG (03/27/2006)    DEXA (06/2004)    ventral hernia repair 2009      Physical Exam  General:     well developed, well nourished, in no acute distress Head:     head is normocephalic eyes: no scleral icterus no periorbital  swelling perrl external ears are normal nose normal externally mouth has no lesion, including normal tongue  Neck:     no masses, thyromegaly, or abnormal cervical nodes.  healed scar is present Lungs:     clear to auscultation.  no respiratory distress  Heart:     regular rate and rhythm, S1, S2 without murmurs, rubs, gallops, or clicks Abdomen:     abdomen is soft, nontender.  no hepatosplenomegaly.   not distended.  no hernia  Rectal:     normal external and internal exam.  heme neg  Prostate:     reduced in size Msk:     no deformity or scoliosis noted with normal posture and gait Pulses:     dorsalis pedis intact bilat.  no carotid bruit  Extremities:     no deformity.  no ulcer on the feet.  feet are of normal color and temp.   1+ left pedal edema.  (none on the right) Neurologic:     cn 2-12 grossly intact.   readily moves all 4's.   sensation is intact to touch on the feet  Skin:     sees derm Cervical Nodes:  no significant adenopathy Psych:     alert and cooperative; normal mood and affect; normal attention span and concentration    Impression & Recommendations:  Problem # 1:  ROUTINE GENERAL MEDICAL EXAM@HEALTH  CARE FACL (ICD-V70.0)  Medications Added to Medication List This Visit: 1)  Humalog Pen 100 Unit/ml Soln (Insulin lispro (human)) .... Qac three times a day) 25-25-30 units 2)  Ascensia Contour Test Strp (Glucose blood) .... Bid 3)  Asacol 400 Mg Tbec (Mesalamine) .... Take 4 two times a day  Other Orders: T- * Misc. Laboratory test [phone]) TLB-TSH (Thyroid Stimulating Hormone) (84443-TSH) TLB-CBC Platelet - w/Differential (85025-CBCD) TLB-BMP (Basic Metabolic Panel-BMET) (80048-METABOL) TLB-Lipid Panel (80061-LIPID) TLB-A1C / Hgb A1C (Glycohemoglobin) (83036-A1C) TLB-Microalbumin/Creat Ratio, Urine (82043-MALB) TLB-Udip w/ Micro (81001-URINE) TLB-PSA (Prostate Specific Antigen) (84153-PSA) TLB-Uric Acid, Blood  (84550-URIC)   Patient Instructions: 1)  we discussed the recommendations of the preventive services task force 2)  stop fosamax 3)  ok to stay-off naprosyn 4)  same rx for htn   Prescriptions: ASCENSIA CONTOUR TEST  STRP (GLUCOSE BLOOD) bid  #180 x 3   Entered and Authorized by:   Minus Breeding MD   Signed by:   Minus Breeding MD on 04/14/2008   Method used:   Electronically to        [facility] [address] [phone]* (retail)       [address]       [place], [place]  [address]       Ph: [phone] or [phone]       Fax: [phone]   RxID:   [id number] SYNTHROID 112 MCG  TABS (LEVOTHYROXINE SODIUM) 2 qd  #180 x 3   Entered and Authorized by:   Minus Breeding MD   Signed by:   Minus Breeding MD on 04/14/2008   Method used:   Electronically to        [facility] [address] [phone]* (retail)       [address]       [place], [place]  [address]       Ph: [phone] or [phone]       Fax: [phone]   RxID:   [id number] HUMULIN N PEN 100 UNIT/ML  SUSP (INSULIN ISOPHANE HUMAN) 20 units qhs  #2 x 3   Entered and Authorized by:   Minus Breeding MD   Signed by:   Minus Breeding MD on 04/14/2008   Method used:   Electronically to        [facility] [address] [phone]* (retail)       [address]       [place], [place]  [address]       Ph: [phone] or [phone]       Fax: [phone]   RxID:   [id number] NIASPAN 500 MG  TBCR (NIACIN (ANTIHYPERLIPIDEMIC)) qhs  #90 x 3   Entered and Authorized by:   Minus Breeding MD   Signed by:   Minus Breeding MD on 04/14/2008   Method used:   Electronically to        [facility] [address] [phone]* (retail)       [address]       [place], [place]  [address]  Ph: [phone] or [phone]       Fax: [phone]   RxID:   [id number] BD U/F III SHORT PEN NEEDLE 31G X  8 MM MISC (INSULIN PEN NEEDLE) qid  #400 x 3   Entered and Authorized by:   Minus Breeding MD   Signed by:   Minus Breeding MD on 04/14/2008   Method used:   Electronically to        [facility] [address] [phone]* (retail)       [address]       [place], [place]  [address]       Ph: [phone] or [phone]       Fax: [phone]   RxID:   [id number] CIALIS 20 MG  TABS (TADALAFIL) TAKE PRN  #30 x 3   Entered and Authorized by:   Minus Breeding MD   Signed by:   Minus Breeding MD on 04/14/2008   Method used:   Electronically to        [facility] [address] [phone]* (retail)       [address]       [place], [place]  [address]       Ph: [phone] or [phone]       Fax: [phone]   RxID:   [id number] BUSPAR 15 MG  TABS (BUSPIRONE HCL) TAKE 1 by mouth BID  #180 x 3   Entered and Authorized by:   Minus Breeding MD   Signed by:   Minus Breeding MD on 04/14/2008   Method used:   Electronically to        [facility] [address] [phone]* (retail)       [address]       [place], [place]  [address]       Ph: [phone] or [phone]       Fax: [phone]   RxID:   [id number] HUMALOG PEN 100 UNIT/ML  SOLN (INSULIN LISPRO (HUMAN)) qac three times a day) 25-25-30 units  #6 boxes x 3   Entered and Authorized by:   Minus Breeding MD   Signed by:   Minus Breeding MD on 04/14/2008   Method used:   Electronically to        [facility] [address] [phone]* (retail)       [address]       [place], [place]  [address]       Ph: [phone] or [phone]       Fax: [phone]   RxID:   [id number] ZESTORETIC 20-12.5 MG  TABS (LISINOPRIL-HYDROCHLOROTHIAZIDE) TAKE 1 by mouth QD  #90 x 3   Entered and Authorized by:   Minus Breeding MD   Signed by:   Minus Breeding MD on 04/14/2008   Method used:   Electronically to        [facility] [address] [phone]* (retail)       [address]       [place], [place]  [address]       Ph: [phone] or [phone]       Fax: [phone]   RxID:   [id number] TENORMIN 25  MG  TABS (ATENOLOL) TAKE 1/2 TAB QD  #45 x 3   Entered and Authorized by:   Minus Breeding MD   Signed by:   Minus Breeding MD on 04/14/2008   Method used:   Electronically to        [facility] [address] [phone]* (retail)       [address]       [place], [place]  [address]       Ph: [phone] or [phone]       Fax: [phone]   RxID:   [id number]  ]

## 2010-07-10 NOTE — Letter (Signed)
Summary: [name]  [facility]   Imported By: Sherian Rein 04/06/2010 10:22:09  _____________________________________________________________________  External Attachment:    Type:   Image     Comment:   External Document

## 2010-07-10 NOTE — Letter (Signed)
Summary: [facility]   Imported By: Lester Perley 09/27/2009 11:47:18  _____________________________________________________________________  External Attachment:    Type:   Image     Comment:   External Document

## 2010-07-10 NOTE — Procedures (Signed)
Summary: Colonoscopy/[facility]  Colonoscopy/[facility]   Imported By: Sherian Rein 10/20/2009 15:09:04  _____________________________________________________________________  External Attachment:    Type:   Image     Comment:   External Document

## 2010-07-10 NOTE — Progress Notes (Signed)
Summary: P A HUMLIN  Phone Note Call from Patient Call back at Home Phone [phone]   Call For: Minus Breeding MD Summary of Call: Pt need a PA on Humlin N 100 units - [place] filled out forms faxed back to [facility] @[phone] waiting on a response Initial call taken by: Shelbie Proctor,  July 07, 2008 9:47 AM  Follow-up for Phone Call        medication has been approved for  [id number] to 07-08-2011....received letter .sent to mr for scan and I faxed to pharmacy Follow-up by: Shelbie Proctor,  July 07, 2008 10:48 AM

## 2010-07-10 NOTE — Letter (Signed)
Summary: Generic Letter  [facility]  [address] [place], [place] [address]   Phone: [phone]  Fax: [phone]    04/18/2009  [facility] [address] [place], [place]  [address]  Dear Mr. PETERS,  I advise you to begin an exercise program.     Sincerely,   Romero Belling MD

## 2010-07-10 NOTE — Progress Notes (Signed)
Summary: Zostavax  ---- Converted from flag ---- ---- 04/18/2009 10:33 AM, Minus Breeding MD wrote: ok  ---- 04/18/2009 9:51 AM, Margaret Pyle, CMA wrote: Pharmacy called stating that rx for Zotavax can only be administered at pharmacy per pt insurance plan. If pt has it administered at clinic it will be a charge of $278. Pharmacist wantds MD's okay to administer at pharm? ------------------------------  Phone Note From Pharmacy   Caller: [facility] #[id number]*

## 2010-07-10 NOTE — Assessment & Plan Note (Signed)
Summary: CPX/AWARE OF FEE/NWS   Vital Signs:  Patient Profile:   70 Years Old Male Weight:      252.13 pounds Temp:     99.3 degrees F oral Pulse rate:   89 / minute BP sitting:   139 / 71  (right arm)                 History of Present Illness: mild hypocalcemia was again noted in the hospital recently  Current Allergies: ! ALLOPURINOL  Past Medical History:    Reviewed history from 12/29/2006 and no changes required:       Anxiety       Diabetes mellitus, type I       Gout       Hypertension       Osteoporosis       CROHN'S       Papillary Thyroid CA (1997)       Impotence       DM Neparopathy       Psoriasis       Prostate cancer, hx of       Dyslipidemia       Hyerglycemia   Family History:    no cancer in the patient's immediate family, except of course for the patient himself, as noted  Social History:    patient is married and is retired   Review of Systems  The patient denies fever, vision loss, decreased hearing, chest pain, syncope, dyspnea on exhertion, prolonged cough, hematuria, incontinence, suspicious skin lesions, and depression.      Review of Systems  The patient denies fever, vision loss, decreased hearing, chest pain, syncope, dyspnea on exhertion, prolonged cough, hematuria, incontinence, suspicious skin lesions, and depression.     Physical Exam  General:     obese.   Mouth:     no deformity or lesions with good dentition Neck:     he has a healed surgical scar on the neck.  No palpable thyroid tissue. Abdomen:     he has a healing surgical scar with staples in place.  Due to this, the rest of his examination is deferred to his surgeon, whom he is going to see in the next few days Rectal:     not done because he sees a gastroenterologist and urologist Genitalia:     deferred to his urologist Prostate:     Deferred to his urologist Msk:     gait is normal Pulses:     dorsalis pedis pulses are intact bilaterally there is  no carotid bruit Extremities:     no clubbing, cyanosis, edema, or deformity noted with normal full range of motion of all joints Neurologic:     no focal deficits, CN II-XII grossly intact with normal reflexes, coordination, muscle strength and tone Skin:     no rash not diaphoretic .  he has severe actinic keratoses and seborrheic keratoses on the head. Cervical Nodes:     no significant adenopathy Psych:     alert and cooperative; normal mood and affect; normal attention span and concentration    Impression & Recommendations:  Problem # 1:  ROUTINE GENERAL MEDICAL EXAM@HEALTH  CARE FACL (ICD-V70.0)  Orders: TLB-Lipid Panel (80061-LIPID) TLB-TSH (Thyroid Stimulating Hormone) (84443-TSH) New Patient 40-64 years ([address])   Medications Added to Medication List This Visit: 1)  Ascensia Contour Test Strp (Glucose blood) 2)  Bd U/f Iii Short Pen Needle 31g X 8 Mm Misc (Insulin pen needle) .... Qid 3)  Humulin  N Pen 100 Unit/ml Susp (Insulin isophane human) .... 20 units qhs 4)  Naproxen Dr 500 Mg Tbec (Naproxen) .... Qd  Other Orders: TLB-A1C / Hgb A1C (Glycohemoglobin) (83036-A1C) T- * Misc. Laboratory test [phone] Venipuncture [phone])   Patient Instructions: 1)  check parathyroid hormone 2)  we reviewed the recommendations of the preventive services task force 3)  check labs today     Vital Signs:  Patient Profile:   71 Years Old Male Weight:      252.13 pounds Temp:     99.3 degrees F oral Pulse rate:   89 / minute BP sitting:   139 / 71  Prescriptions: ASCENSIA CONTOUR TEST  STRP (GLUCOSE BLOOD)   #200 x 0   Entered and Authorized by:   Minus Breeding MD   Signed by:   Minus Breeding MD on 04/13/2007   Method used:   Electronically sent to ...       [facility] [address] #[id number]*       [address]       [place], [place]  [address]       Ph: [phone] or [phone]       Fax: [phone]   RxID:   [id number]  U/F III SHORT PEN NEEDLE 31G X 8 MM MISC (INSULIN PEN NEEDLE) qid  #200 x 11   Entered and Authorized by:   Minus Breeding MD   Signed by:   Minus Breeding MD on 04/13/2007   Method used:   Electronically sent to ...       [facility] [address] #[id number]*       [address]       [place], [place]  [address]       Ph: [phone] or [phone]       Fax: [phone]   RxID:   [id number] BD U/F III SHORT PEN NEEDLE 31G X 8 MM MISC (INSULIN PEN NEEDLE)   #0 x 0   Entered and Authorized by:   Minus Breeding MD   Signed by:   Minus Breeding MD on 04/13/2007   Method used:   Electronically sent to ...       [facility] #[id number]*       [address]       [place], [place]  [address]       Ph: [phone] or [phone]       Fax: [phone]   RxID:   [id number] ASCENSIA CONTOUR TEST  STRP (GLUCOSE BLOOD)   #200 x 0   Entered and Authorized by:   Minus Breeding MD   Signed by:   Minus Breeding MD on 04/13/2007   Method used:   Electronically sent to ...       [facility] [address] #58527*       [address]       [place], [place]  [address]       Ph: [phone] or [phone]       Fax: [phone]   RxID:   [id number] CIALIS 20 MG  TABS (TADALAFIL) TAKE PRN  #10 x 0   Entered and Authorized by:   Minus Breeding MD   Signed by:  Minus Breeding MD on 04/13/2007   Method used:   Electronically sent to ...       [facility] #[id number]*       [address]       [name], [place]  [address]       Ph: [phone] or [phone]       Fax: [phone]   RxID:   [id number] BUSPAR 15 MG  TABS (BUSPIRONE HCL) TAKE 1 by mouth BID  #60 x 0   Entered and Authorized by:   Minus Breeding MD   Signed by:   Minus Breeding MD on 04/13/2007   Method used:   Electronically sent to ...       [facility] [address]  #[id number]*       [address]       [place], [place]  [address]       Ph: [phone] or [phone]       Fax: [phone]   RxID:   [id number] HUMALOG PEN 100 UNIT/ML  SOLN (INSULIN LISPRO (HUMAN)) SEE OFFICE NOTES  #30 Each x 1   Entered and Authorized by:   Minus Breeding MD   Signed by:   Minus Breeding MD on 04/13/2007   Method used:   Electronically sent to ...       [facility] [address] #[id number]*       [address]       [place], [place]  [address]       Ph: [phone] or [phone]       Fax: [phone]   RxID:   [id number] LEVOXYL 200 MCG  TABS (LEVOTHYROXINE SODIUM) TAKE1 by mouth QD  #30 x 11   Entered and Authorized by:   Minus Breeding MD   Signed by:   Minus Breeding MD on 04/13/2007   Method used:   Electronically sent to ...       [facility] [address] #[id number]*       [address]       [place], [place]  [address]       Ph: [phone] or [phone]       Fax: [phone]   RxID:   [id number] ZESTORETIC 20-12.5 MG  TABS (LISINOPRIL-HYDROCHLOROTHIAZIDE) TAKE 1 by mouth QD  #30 x 11   Entered and Authorized by:   Minus Breeding MD   Signed by:   Minus Breeding MD on 04/13/2007   Method used:   Electronically sent to ...       [facility] [address] #[id number]*       [address]       [place], [place]  [address]       Ph: [phone] or [phone]       Fax: [phone]   RxID:   [id number] TENORMIN 25 MG  TABS (ATENOLOL) TAKE 1/2 TAB QD  #15 x 11   Entered and Authorized by:   Minus Breeding MD   Signed by:   Minus Breeding MD on 04/13/2007   Method used:   Electronically sent to ...       [facility] [address] #[id number]*       [address]       [place], [place]  [address]       Ph: [phone] or [phone]       Fax: [phone]   RxID:   [id number] FOSAMAX 70 MG   TABS (ALENDRONATE SODIUM) TAKE 1 Q WEEK  #4 x 11   Entered and Authorized by:   Minus Breeding MD   Signed by:   Minus Breeding MD on 04/13/2007   Method used:   Electronically sent to ...       [facility] [address] #[id number]*       [address]       [place], [place]  [address]       Ph: [phone] or [phone]       Fax: [phone]   RxID:   [id number] NAPROXEN DR 500 MG  TBEC (NAPROXEN) qd  #30 x 11   Entered and Authorized by:   Minus Breeding MD   Signed by:   Minus Breeding MD on 04/13/2007   Method used:   Electronically sent to ...       [facility] [address] #[id number]*       [address]       [place], [place]  [address]       Ph: [phone] or [phone]       Fax: [phone]   RxID:   [id number] HUMULIN N PEN 100 UNIT/ML  SUSP (INSULIN ISOPHANE HUMAN) 20 units qhs  #1 x 11   Entered and Authorized by:   Minus Breeding MD   Signed by:   Minus Breeding MD on 04/13/2007   Method used:   Electronically sent to ...       [facility] [address] #[id number]*       [address]       [place], [place]  [address]       Ph: [phone] or [phone]       Fax: [phone]   RxID:   [id number]  ]  Appended Document: Orders Update    Clinical Lists Changes  Orders: Added new Service order of Est. Patient 40-64 years ([address]) - Signed

## 2010-07-10 NOTE — Progress Notes (Signed)
Summary: biopsy  ---- Converted from flag ---- ---- 04/20/2008 4:16 PM, Minus Breeding MD wrote: please request 1997 d/c summary  ---- 04/18/2008 4:53 PM, Orlan Leavens wrote: There is only 1 chart on Mr. Haak you had a ? about thyroid biopsy back in 1997 nothing in chart. Where did patient have done at ------------------------------       Additional Follow-up for Phone Call Additional follow up Details #2::    Called patient to find out where he had Biopsy done at. He stated he think [facility] the thyroidectomy was done at [facility]. Check in E-chart no information was in there concerning biopsy or thyroidectomy.  Pt will stop by to sign a medical release form so we can fax to [facility]. Follow-up by: Orlan Leavens,  April 26, 2008 11:10 AM

## 2010-07-10 NOTE — Medication Information (Signed)
Summary: Humulin PEN APPROVED til 2013/[facility]  Humulin PEN APPROVED til 2013/[facility]   Imported By: Lester Bellemeade 07/12/2008 10:24:46  _____________________________________________________________________  External Attachment:    Type:   Image     Comment:   External Document

## 2010-07-10 NOTE — Medication Information (Signed)
Summary: Approval for Rx HUMALOG/[facility]  Approval for Rx HUMALOG/[facility]   Imported By: Sherian Rein 07/27/2008 15:26:53  _____________________________________________________________________  External Attachment:    Type:   Image     Comment:   External Document

## 2010-07-10 NOTE — Miscellaneous (Signed)
  Clinical Lists Changes  Medications: Removed medication of LEVOXYL 200 MCG  TABS (LEVOTHYROXINE SODIUM) TAKE1 by mouth QD Added new medication of SYNTHROID 112 MCG  TABS (LEVOTHYROXINE SODIUM) 2 qd - Signed Rx of SYNTHROID 112 MCG  TABS (LEVOTHYROXINE SODIUM) 2 qd;  #60 x 11;  Signed;  Entered by: Minus Breeding MD;  Authorized by: Minus Breeding MD;  Method used: Electronic    Prescriptions: SYNTHROID 112 MCG  TABS (LEVOTHYROXINE SODIUM) 2 qd  #60 x 11   Entered and Authorized by:   Minus Breeding MD   Signed by:   Minus Breeding MD on 04/14/2007   Method used:   Electronically sent to ...       [facility] #[id number]*       [address]       [place], [place]  [address]       Ph: [phone] or [phone]       Fax: [phone]   RxID:   [id number]

## 2010-07-10 NOTE — Letter (Signed)
Summary: [facility]  [facility]   Imported By: Lester Whitehouse 03/13/2009 10:41:29  _____________________________________________________________________  External Attachment:    Type:   Image     Comment:   External Document

## 2010-07-10 NOTE — Progress Notes (Signed)
----   Converted from flag ---- ---- 04/18/2009 10:33 AM, Minus Breeding MD wrote: ok  ---- 04/18/2009 9:51 AM, Margaret Pyle, CMA wrote: Pharmacy called stating that rx for Zotavax can only be administered at pharmacy per pt insurance plan. If pt has it administered at clinic it will be a charge of $278. Pharmacist wantds MD's okay to administer at pharm? ------------------------------

## 2010-07-10 NOTE — Assessment & Plan Note (Signed)
Summary: YEARLY MEDICARE PHYSICAL-LB   Vital Signs:  Patient profile:   70 year old male Height:      71 inches (180.34 cm) Weight:      239 pounds (108.64 kg) BMI:     33.45 O2 Sat:      97 % on Room air Temp:     97.8 degrees F (36.56 degrees C) oral Pulse rate:   77 / minute Pulse rhythm:   regular BP sitting:   120 / 68  (left arm) Cuff size:   large  Vitals Entered By: Brenton Grills CMA Duncan Dull) (April 23, 2010 8:37 AM)  O2 Flow:  Room air CC: Yearly/pt declined Flu shot/aj Is Patient Diabetic? Yes   CC:  Yearly/pt declined Flu shot/aj.  History of Present Illness: here for regular wellness examination.  He's feeling pretty well in general, and does not drink or smoke.   Current Medications (verified): 1)  Tenormin 25 Mg  Tabs (Atenolol) .... Take 1/2 Tab Once Daily 2)  Zestoretic 20-12.5 Mg  Tabs (Lisinopril-Hydrochlorothiazide) .... Take 1 By Mouth Once Daily 3)  Humalog Pen 100 Unit/ml  Soln (Insulin Lispro (Human)) .... Qac Three Times A Day) 20-20-25 Units 4)  Buspar 15 Mg  Tabs (Buspirone Hcl) .... Take 1 By Mouth Two Times A Day 5)  Cialis 20 Mg  Tabs (Tadalafil) .... Take Prn 6)  Ascensia Contour Test  Strp (Glucose Blood) .... Two Times A Day 7)  Bd U/f Iii Short Pen Needle 31g X 8 Mm Misc (Insulin Pen Needle) .... 4x A Day 8)  Humulin N Pen 100 Unit/ml  Susp (Insulin Isophane Human) .... 20 Units At Bedtime 9)  Asacol 400 Mg Tbec (Mesalamine) .... Take 4 Two Times A Day 10)  Zostavax 16109 Unt/0.12ml Solr (Zoster Vaccine Live) .... We Will Administer 11)  Synthroid 200 Mcg Tabs (Levothyroxine Sodium) .Marland Kitchen.. 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 no cancer in the patient's immediate family, except of course for the patient himself, as noted  Social History: Reviewed history from 04/13/2007 and no changes required. patient is married and is retired  Review of Systems  The patient denies fever, weight loss, weight gain, vision loss, decreased hearing, chest pain, dyspnea on exertion, prolonged cough, headaches, abdominal pain, melena, hematochezia, severe indigestion/heartburn, hematuria, suspicious skin lesions, and depression.         he seldom has hypoglycemia.  most common is after bre[place].  it is highest at hs.  Physical Exam  General:  normal appearance.   Head:  head: no deformity eyes: no periorbital swelling, no proptosis external nose and ears are normal mouth: no lesion seen Neck:  Supple without thyroid enlargement or tenderness.  Lungs:  Clear to auscultation bilaterally. Normal respiratory effort.  Heart:  Regular rate and rhythm without murmurs or gallops noted. Normal S1,S2.   Abdomen:  abdomen is soft, nontender.  no hepatosplenomegaly.   not distended.  there is a self-reducing  ventral herniahernia there are several healed surgical scars Rectal:  (had prostatect and colonoscopy) Prostate:  (had prostatect) Msk:  muscle bulk and strength are grossly normal.  no obvious joint swelling.  gait is normal and steady  Pulses:  dorsalis pedis intact bilat.  no carotid bruit  Extremities:  no deformity.  no ulcer on the feet.  feet are of normal color and temp.   1+ right  pedal edema, 1+ left pedal edema, and mycotic toenails.   Neurologic:  cn 2-12 grossly intact.   readily moves all 4's.   sensation is intact to touch on the feet  Skin:  normal texture and temp.  no rash.  not diaphoretic there is sun-damaged skin Cervical Nodes:  No significant adenopathy.  Psych:  Alert and cooperative; normal mood and affect; normal attention span and concentration.     Impression & Recommendations:  Problem # 1:  ROUTINE GENERAL MEDICAL EXAM@HEALTH  CARE FACL (ICD-V70.0) we discussed code status.  pt requests full code, but would not want to be started or maintained on artificial life-support measures if there was not a reasonable chance of recovery  Medications Added to Medication List This Visit: 1)  Tenormin 25 Mg Tabs (Atenolol) .... Take 1/2 tab once daily 2)  Zestoretic 20-12.5 Mg Tabs (Lisinopril-hydrochlorothiazide) .... Take 1 by mouth once daily 3)  Humalog Pen 100 Unit/ml Soln (Insulin lispro (human)) .... Qac three times a day) 15-20-35 units 4)  Humalog Pen 100 Unit/ml Soln (Insulin lispro (human)) .... Qac three times a day) 20-20-30 units 5)  Buspar 15 Mg Tabs (Buspirone hcl) .... Take 1 by mouth two times a day 6)  Ascensia Contour Test Strp (Glucose blood) .... Two times a day 7)  Bd U/f Iii Short Pen Needle 31g X 8 Mm Misc (Insulin pen needle) .... 4x a day 8)  Humulin N Pen 100 Unit/ml Susp (Insulin isophane human) .... 20 units at bedtime 9)  Synthroid 200 Mcg Tabs (Levothyroxine sodium) .Marland Kitchen.. 1 once daily  Other Orders: T-Thyroglobulin Panel (09811, [phone]) EKG w/ Interpretation (93000) T-Bone Densitometry (13086) TLB-Lipid Panel (80061-LIPID) TLB-BMP (Basic Metabolic Panel-BMET) (80048-METABOL) TLB-CBC Platelet - w/Differential (85025-CBCD) TLB-Hepatic/Liver Function Pnl (80076-HEPATIC) TLB-TSH (Thyroid Stimulating Hormone) (84443-TSH) TLB-A1C / Hgb A1C (Glycohemoglobin) (83036-A1C) TLB-Microalbumin/Creat Ratio, Urine  (82043-MALB) TLB-Udip w/ Micro (81001-URINE) TLB-Uric Acid, Blood (84550-URIC) TLB-PSA (Prostate Specific Antigen) (84153-PSA) Est. Patient 65& > (57846)  Patient Instructions: 1)  please consider these measures for your health:  minimize alcohol.  do not use tobacco products.  have a colonoscopy at least every 10 years from age 81.  keep firearms safely stored.  always use seat belts.  have working smoke alarms in your home.  see an eye doctor and dentist regularly.  never drive under the influence of alcohol or drugs (including prescription drugs).  those with fair skin should take precautions against the sun. 2)  please let me know what your wishes would be, if artificial life support measures should become necessary.  it is critically important to prevent falling down (keep floor areas well-lit, dry, and free of loose objects) 3)  controlling your blood pressure and cholesterol drastically reduces the damage diabetes does to your body.  this also applies to quitting smoking.  please discuss these with your doctor.  you should take an aspirin every day, unless you have been advised by a doctor not to. 4)  check your blood sugar 2 times a day.  vary the time of day when you check, between before the  3 meals, and at bedtime.  also check if you have symptoms of your blood sugar being too high or too low.  please keep a record of the readings and bring it to your next appointment here.  please call us sooner if you are having low blood sugar episodes. 5)  good diet and exercise habits significanly improve the control of your diabetes.  please let me know if you wish to be referred to a dietician.  high blood sugar is very risky to your health.  you should see an eye doctor every year. 6)  Please schedule a follow-up appointment in 6 months. 7)  (update: i left message on phone-tree:  change humalog to three times a day (just before each meal) 15-20-35 units   Orders Added: 1)  T-Thyroglobulin Panel  [57846, 96295-28413] 2)  EKG w/ Interpretation [93000] 3)  T-Bone Densitometry [77080] 4)  TLB-Lipid Panel [80061-LIPID] 5)  TLB-BMP (Basic Metabolic Panel-BMET) [80048-METABOL] 6)  TLB-CBC Platelet - w/Differential [85025-CBCD] 7)  TLB-Hepatic/Liver Function Pnl [80076-HEPATIC] 8)  TLB-TSH (Thyroid Stimulating Hormone) [84443-TSH] 9)  TLB-A1C / Hgb A1C (Glycohemoglobin) [83036-A1C] 10)  TLB-Microalbumin/Creat Ratio, Urine [82043-MALB] 11)  TLB-Udip w/ Micro [81001-URINE] 12)  TLB-Uric Acid, Blood [84550-URIC] 13)  TLB-PSA (Prostate Specific Antigen) [84153-PSA] 14)  Est. Patient 65& > [24401]

## 2010-08-13 ENCOUNTER — Encounter: Payer: Self-pay | Admitting: Endocrinology

## 2010-08-13 ENCOUNTER — Other Ambulatory Visit: Payer: Self-pay | Admitting: Endocrinology

## 2010-08-13 ENCOUNTER — Other Ambulatory Visit: Payer: Medicare Other

## 2010-08-13 ENCOUNTER — Ambulatory Visit (INDEPENDENT_AMBULATORY_CARE_PROVIDER_SITE_OTHER): Payer: Medicare Other | Admitting: Endocrinology

## 2010-08-13 DIAGNOSIS — Z79899 Other long term (current) drug therapy: Secondary | ICD-10-CM

## 2010-08-13 DIAGNOSIS — E109 Type 1 diabetes mellitus without complications: Secondary | ICD-10-CM

## 2010-08-13 LAB — HEPATIC FUNCTION PANEL
ALT: 35 U/L (ref 0–53)
AST: 35 U/L (ref 0–37)
Albumin: 4.1 g/dL (ref 3.5–5.2)
Alkaline Phosphatase: 69 U/L (ref 39–117)
Bilirubin, Direct: 0.2 mg/dL (ref 0.0–0.3)
Total Bilirubin: 0.9 mg/dL (ref 0.3–1.2)
Total Protein: 6.6 g/dL (ref 6.0–8.3)

## 2010-08-13 LAB — TSH: TSH: 0.11 u[IU]/mL — ABNORMAL LOW (ref 0.35–5.50)

## 2010-08-15 ENCOUNTER — Other Ambulatory Visit: Payer: Medicare Other

## 2010-08-15 ENCOUNTER — Other Ambulatory Visit: Payer: Self-pay | Admitting: Endocrinology

## 2010-08-15 ENCOUNTER — Encounter (INDEPENDENT_AMBULATORY_CARE_PROVIDER_SITE_OTHER): Payer: Self-pay | Admitting: *Deleted

## 2010-08-15 DIAGNOSIS — E109 Type 1 diabetes mellitus without complications: Secondary | ICD-10-CM

## 2010-08-15 LAB — HEMOGLOBIN A1C: Hgb A1c MFr Bld: 6.6 % — ABNORMAL HIGH (ref 4.6–6.5)

## 2010-08-28 NOTE — Assessment & Plan Note (Signed)
Summary: FU ON DM /NWS  #   Vital Signs:  Patient profile:   70 year old male Height:      71 inches (180.34 cm) Weight:      240.13 pounds (109.15 kg) BMI:     33.61 O2 Sat:      98 % on Room air Temp:     98.3 degrees F (36.83 degrees C) oral Pulse rate:   77 / minute Pulse rhythm:   regular BP sitting:   108 / 64  (left arm) Cuff size:   large  Vitals Entered By: Brenton Grills CMA Duncan Dull) (August 13, 2010 8:03 AM)  O2 Flow:  Room air CC: Follow up on DM/aj Is Patient Diabetic? Yes   CC:  Follow up on DM/aj.  History of Present Illness: pt states he feels well in general.  no cbg record, but states cbg's are improved with the adjustments made at last ov here.  he says cbg is highest in am (higher than at hs), and lowest before lunch. pt reports 1 year of thickening of the toenails of the right foot, but no assoc bleeding.  Current Medications (verified): 1)  Tenormin 25 Mg  Tabs (Atenolol) .... Take 1/2 Tab Once Daily 2)  Zestoretic 20-12.5 Mg  Tabs (Lisinopril-Hydrochlorothiazide) .... Take 1 By Mouth Once Daily 3)  Humalog Pen 100 Unit/ml  Soln (Insulin Lispro (Human)) .... Qac Three Times A Day) 15-20-35 Units 4)  Buspar 15 Mg  Tabs (Buspirone Hcl) .... Take 1 By Mouth Two Times A Day 5)  Cialis 20 Mg  Tabs (Tadalafil) .... Take Prn 6)  Ascensia Contour Test  Strp (Glucose Blood) .... Two Times A Day 7)  Bd U/f Iii Short Pen Needle 31g X 8 Mm Misc (Insulin Pen Needle) .... 4x A Day 8)  Humulin N Pen 100 Unit/ml  Susp (Insulin Isophane Human) .... 20 Units At Bedtime 9)  Asacol 400 Mg Tbec (Mesalamine) .... Take 4 Two Times A Day 10)  Zostavax 44010 Unt/0.42ml Solr (Zoster Vaccine Live) .... We Will Administer 11)  Synthroid 200 Mcg Tabs (Levothyroxine Sodium) .Marland Kitchen.. 1 Once Daily  Allergies (verified): No Known Drug Allergies  Past History:  Past Medical History: Reviewed history from 04/18/2009 and no changes required. 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         no toe pain  Physical Exam  General:  normal appearance.   Pulses:  dorsalis pedis intact bilat.   Extremities:  no deformity.  no ulcer on the feet.  feet are of normal color and temp.   1+ right pedal edema, 1+ left pedal edema, and mycotic toenails.   Neurologic:  sensation is intact to touch on the feet  Additional Exam:   Hemoglobin A1C       [H]  6.6 %     Impression & Recommendations:  Problem # 1:  DIABETES MELLITUS, TYPE I (ICD-250.01) well-controlled  Problem # 2:  onychomycosis new  Medications Added to Medication List This Visit: 1)  Terbinafine Hcl 250 Mg Tabs (Terbinafine hcl) .Marland KitchenMarland KitchenMarland Kitchen  1 tab once daily  Other Orders: TLB-A1C / Hgb A1C (Glycohemoglobin) (83036-A1C) TLB-TSH (Thyroid Stimulating Hormone) (84443-TSH) TLB-Hepatic/Liver Function Pnl (80076-HEPATIC) Est. Patient Level IV (04540)  Patient Instructions: 1)  blood tests are being ordered for you today.  please call [phone] to hear your test results. 2)  take terbinafine 250 mg once daily, x 12 weeks. 3)  Please schedule a follow-up appointment in 4 months. Prescriptions: TERBINAFINE HCL 250 MG TABS (TERBINAFINE HCL) 1 tab once daily  #84 x 0   Entered and Authorized by:   Minus Breeding MD   Signed by:   Minus Breeding MD on 08/13/2010   Method used:   Electronically to        [facility] [phone]  (retail)       [address] [place], [place]  [address]       Ph: [phone]       Fax: [phone]   RxID:   [id number]    Orders Added: 1)  TLB-A1C / Hgb A1C (Glycohemoglobin) [83036-A1C] 2)  TLB-TSH (Thyroid Stimulating Hormone) [84443-TSH] 3)  TLB-Hepatic/Liver Function Pnl [80076-HEPATIC] 4)  Est. Patient Level IV [47425]

## 2010-10-23 NOTE — Discharge Summary (Signed)
NAME:  Devin [name], Devin [name]             ACCOUNT NO.:  [id number]   MEDICAL RECORD NO.:  [id number]          PATIENT TYPE:  INP   LOCATION:  [place]                         FACILITY:  [facility]   PHYSICIAN:  Fenton Malling. Lucia Gaskins, M.D.  DATE OF BIRTH:  December 21, 1940   DATE OF ADMISSION:  03/04/2008  DATE OF DISCHARGE:  03/06/2008                               DISCHARGE SUMMARY   Date of discharge ??   DISCHARGE DIAGNOSES:  1. Ventral incisional hernia (10 x 11 cm defect).  2. Crohn disease.  3. Insulin dependent diabetes mellitus.  4. Hypertension.  5. Anxiety.  6. Osteoporosis.   OPERATIONS PERFORMED:  The patient had a laparoscopic ventral hernia  repair on March 04, 2008   INDICATIONS FOR PROCEDURE:  Devin [name] is a 70 year old white male  patient of Dr. Renato Shin and who sees Dr. Earlie Raveling from a GI  standpoint.   I did a resection of his terminal ileum and cecum in October 2008 for  Crohn disease.  Approximately May of this year he noticed increasing  bulge in his upper abdomen and on physical exam has a ventral hernia.   We discussed with him the options for treating this, and feel he would  be a good candidate for laparoscopic ventral hernia repair.   In addition to his ventral hernia, he carries a diagnosis of:  1. Crohn disease for which he is doing well  2. Insulin dependent diabetes mellitus.  3. Hypertension.  4. Osteoporosis.  5. Anxiety.  6. 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   HOSPITAL COURSE:  On the day of admission the patient was taken to the  operating room where he underwent a laparoscopic ventral hernia repair  with a 23 x 20 cm Parietex mesh.   He did well postop, he was on clear liquids for the first postoperative  day and his blood sugars ran in the 150s, his diet was increased.  He is  now 2 days postop and has had a bowel movement.  He had no nausea or  vomiting and is ready for discharge.   DISCHARGE INSTRUCTIONS:  1. Regular diabetic diet.  2. He  can shower starting tonight.  3. No driving for 3-4 days.  He is not working so there is no job to      get back to.   DISCHARGE MEDICATIONS:  I did give him Vicodin for pain and he can  resume his home medicines which include:   1. Buspirone 50 mg b.i.d..  2. Lisinopril hydrochlorothiazide 25 mg.  3. Atenolol 12.5 mg daily.  4. Naproxen 5 mg twice a day.  5. Levoxyl 0.2 mg daily.  6. Asacol 1200 mg twice a day.  7. Niaspan 500 mg daily.  8. Humulin per his normal diabetic routine  9. Fosamax   DISCHARGE CONDITION:  Good.      Fenton Malling. Lucia Gaskins, M.D.  Electronically Signed     DHN/MEDQ  D:  03/06/2008  T:  03/07/2008  Job:  [id number]   cc:   Hilliard Clark A. Loanne Drilling, [address] [place]  [place] [address]  Mayme Genta, M.D.  Fax: [phone]

## 2010-10-23 NOTE — Op Note (Signed)
NAME:  Devin [name], Devin [name]             ACCOUNT NO.:  [id number]   MEDICAL RECORD NO.:  [id number]          PATIENT TYPE:  INP   LOCATION:  [place]                         FACILITY:  [facility]   PHYSICIAN:  Fenton Malling. Lucia Gaskins, M.D.  DATE OF BIRTH:  Nov 19, 1940   DATE OF PROCEDURE:  03/04/2008  DATE OF DISCHARGE:                               OPERATIVE REPORT   Date of surgery ??   PREOPERATIVE DIAGNOSIS:  Ventral incisional hernia.   POSTOPERATIVE DIAGNOSIS:  Ventral incisional hernia with a large defect  being 10 x 11 cm.   PROCEDURE:  Laparoscopic ventral hernia repair with a 23 x 20 cm  Parietex mesh, and nodular changes of the liver.   SURGEON:  Fenton Malling. Lucia Gaskins, MD.   FIRST ASSISTANT:  None.   ANESTHESIA:  General.   ESTIMATED BLOOD LOSS:  Minimal.   PROCEDURE:  Devin [name] is a 70 year old white male, who is patient of  Dr. Renato Shin and Dr. Earlie Raveling, who had a resection of his  terminal ileum and cecum secondary to strictured Crohn disease in  October of 2008.  He did well until early this year when he noticed an  increasing bulge in his upper abdomen, and this is consistent with a  ventral incisional hernia.   I discussed with the patient about the options for treatment.  Surgical  treatment risks include bleeding, infection, bowel injury, and the need  for open surgery.   OPERATIVE NOTE:  The patient placed in a supine position and given a  general anesthetic.  He was given 1 g of Ancef at the initiation of the  procedure.  His abdomen was prepped with Betadine solution and sterilely  draped in an Ioban drape placed over the abdominal wall.  A timeout was  held to identify the patient and procedure.   I accessed the abdominal cavity in the left upper quadrant using an 11  mm Optiview trocar inserted through the abdominal cavity.  I placed 3  additional trocars, a 5 mm in the left lower quadrant, a 5 mm in the  right lower quadrant, and a 5 mm in the right upper quadrant  trocars.  I  spent about 30 to 45 minutes taking down adhesions of omentum to the  anterior abdominal wall, but there was no bowel adhesed to the anterior  abdominal wall.  After this was taken down, I did measure  his hernia  defect.  His main hernia defect was 10 x 11 cm, but he also had some  Swiss-cheese defects inferior to that for a total of defect of probably  15 or 16 cm in craniocaudad length.   I used a piece of Parietex mesh, which measured 25 x 20 cm.  I cut out  about 2 cm of mesh, to create a 23 x 20-cm mesh, which seemed to cover  the defect about  4 cm in all margins.   I placed eight 0 Novofil sutures in a clockwise fashion in the mesh and  inserted the mesh into the abdominal cavity.  I then captured the  sutures with an EndoClose and  tied these down to the anterior abdominal  wall.  The mesh laid out well.  I then used the ProTack tacker to tack  in.  I used approximately 56 tacks to tack the mesh at about 1 to 1.5-cm  margins around the edge.   After I completed tacking, I took pictures of the mesh, and the mesh lay  flat.  I did desufflate the abdomen 1 time to make sure there were no  obvious weaknesses along the edge of the mesh and then reinsufflated the  abdomen.  There was no bleeding that I saw.  The patient tolerated the  procedure well.  The trocars were then removed in turn.  Each trocar  site was closed with a 5-0 Vicryl suture, and then the stab wounds were  painted with Tincture of Benzoin and Steri-stripped.  Sponge and needle  count were correct at the end of the case.   The patient tolerated the procedure well and was transported to the  recovery room in good condition.      Fenton Malling. Lucia Gaskins, M.D.  Electronically Signed     DHN/MEDQ  D:  03/04/2008  T:  03/04/2008  Job:  [id number]   cc:   Hilliard Clark A. Loanne Drilling, [address]  [place]  [place] [address]   Mayme Genta, M.D.  Fax: [phone]

## 2010-10-23 NOTE — Op Note (Signed)
NAME:  Devin [name], Devin [name]             ACCOUNT NO.:  [id number]   MEDICAL RECORD NO.:  [id number]          PATIENT TYPE:  INP   LOCATION:  NA                           FACILITY:  [facility]   PHYSICIAN:  Fenton Malling. Lucia Gaskins, M.D.  DATE OF BIRTH:  August 15, 1940   DATE OF PROCEDURE:  04/01/2007  DATE OF DISCHARGE:                               OPERATIVE REPORT   PREOPERATIVE DIAGNOSIS:  Crohn's disease, small bowel with history of  partial bowel obstructions.   POSTOPERATIVE DIAGNOSIS:  Crohn's disease of terminal ileum with  multiple strictures, question inflamed appendix, and Meckel's  diverticula.   PROCEDURE:  Resection of cecum, appendix, terminal ileum (approximately  40 cm), and Meckel's diverticula, stricturoplasty x1.   SURGEON:  Dr. Lucia Gaskins   FIRST ASSISTANT:  Dr. Verita Lamb   ANESTHESIA:  General endotracheal.   ESTIMATED BLOOD LOSS:  300 mL.   DRAINS:  None.   INDICATIONS FOR PROCEDURE:  [name] is a 70 year old white male who  is a patient of Dr. Merry Proud Medoff's and sees Dr. Renato Shin as a medical  doctor.   He has had Crohn's for over 10 years, has had multiple different types  of treatment for his Crohn's in which he has been fairly successful in  his treatment.  However, over the last 6 months, he has had increasing  symptoms of partial bowel obstruction with abdominal distention.  He had  an x-ray at [facility] in September which was evidence of a partial  bowel obstruction and has seen me for this evidence of bowel  obstruction.   A small bowel series done preoperatively showed an effaced strictured  terminal ileum which was fairly lengthy.   I discussed with the patient the complications of surgery which include  bleeding, infection, leak, continued symptoms of Crohn's disease, and  possible continued strictures I could not care of.   OPERATIVE NOTE:  The patient placed in a supine position, given a  general endotracheal anesthetic.  He completed a mechanical  bowel prep  at home.  He had a Foley in place and NG in place, was given 1 g of  cephalexin at the initiation of the procedure.   I started out laparoscopically.  I placed five 5 mm trocars.  I got into  the abdomen with an OptiVu, placed 4 additional trocars, and carried out  a laparoscopic exploration.  He actually had a lot more fat than what I  imagined preoperatively.  I identified his terminal ileum which was  thick with evidence of fat creep over the bowel.  I also identified a  Meckel's diverticula, and his appendix actually appeared to be kind of  subacutely inflamed stuck along his right colonic gutter.  Whether this  was Crohn's disease or another process is somewhat unclear.  I was able  to mobilize his right colon and get his colon to the midline.  I then  went to an open procedure after this mobilization; however, he had such  thickened, foreshortened bowel, there is no way I could really do this  through much of a laparoscopic-assisted procedure and ended up doing  more of a general laparotomy.  I ran the small bowel from the ligament  of Treitz to the terminal ileum.  His distal 2-3 feet of small bowel had  this fat creep and had what looked like multiple strictures.  I found  the Meckel's diverticula and then proximal to the Meckel's, I found 2-3  other spots of Crohn's, though the one seemed to have a very high-grade  stricture.  The others, I could at least get my finger, I thought,  through.   So, my plan was to:  1. Remove the inflamed appendix.  2. Make sure his Meckel's was resected.  It appeared to be from the      [place] to the terminal ileum which is approximately 2 feet.  I      could resect this segment of bowel which would get out the majority      of his Crohn's disease.  I was going to take out his cecum so I      could remove his ileocecal valve and do a side-to-side anastomosis.   I divided the cecum with a 75 mm staple.  I divided the small bowel  with  the 75 mm staple.  I then took down the mesentery with a harmonic  scalpel.  I had to put a couple of sutures to control bleeding in the  vessels.  I thought I could pull it away up in the mesentery.  I  identified actually the ureter during the laparoscopic exam and did not  feel I could pull up the ureter during my resection of the cecum.   After it was resected, I sent this for a specimen along with the  appendix.   I then carried out a side-to-side stapled anastomosis between the ileum  and the right colon.  The mucosa looked healthy.  There appeared to be  really no external evidence of Crohn's in what I was stapling into.  He  did have some filled thickened segments of small bowel, and I am  assuming he probably still had some Crohn's I was leaving behind, and I  did a side-to-side stapled anastomosis which was about 3-4 cm long, and  I closed the enterotomy with interrupted 2-0 silk sutures.  I went  proximal to this about 1 foot or foot and a half, found this high-grade  stricture and stricturoplasty where I divided the small bowel  longitudinally and closed it transversely.   The patient tolerated the procedure well, was transferred to the  recovery room in good condition.  I did irrigate the abdomen with 4-5  liters of saline.  I closed his abdomen with 2 running #1 Novofil  sutures.  I interrupted with a  couple of PDS sutures.  I used 3 PDS sutures, and I used the #1 Novofil  because of his diabetes, his age, his chronic immunosuppression from his  steroids. The sponge and needle count were correct at the end of the  case.   He tolerated the procedure well.  Sponge and needle counts were correct  at the end of the case.      Fenton Malling. Lucia Gaskins, M.D.  Electronically Signed     DHN/MEDQ  D:  04/01/2007  T:  04/02/2007  Job:  [id number]   cc:   Mayme Genta, M.D.  Fax: [place]. Loanne Drilling, [place]  [place] [address]

## 2010-10-23 NOTE — Discharge Summary (Signed)
NAME:  Devin [name], Devin [name] NO.:  [id number]   MEDICAL RECORD NO.:  [id number]          PATIENT TYPE:  INP   LOCATION:  1509                         FACILITY:  [facility]   PHYSICIAN:  Fenton Malling. Lucia Gaskins, M.D.  DATE OF BIRTH:  30-Jul-1940   DATE OF ADMISSION:  04/01/2007  DATE OF DISCHARGE:  04/07/2007                               DISCHARGE SUMMARY   DISCHARGE DIAGNOSES:  1. Crohn's disease with distal ileal stricture secondary to Crohn's      disease.  2. Meckel's diverticula, benign.  3. Insulin dependent diabetes mellitus.  4. Hypertension.  5. History of anxiety.  6. Arthritis of hands and back.  7. Osteoporosis.  8. History of iron-deficiency anemia.  9. Urinary retention while in the hospital.   OPERATION PERFORMED:  He had a resection of his cecum, appendix,  terminal ileum and Meckel's diverticula and a stricturoplasty x1 on  April 01, 2007.   HISTORY OF ILLNESS:  Devin [name] is a pleasant 70 year old white male  whose primary medical doctor is Dr. Renato Shin.  He has been followed  for Crohn's disease by Dr. Earlie Raveling.   He has had Crohn's for over 10 years.  He was initially, I think,  followed by Dr. Lyla Son, but over the last 3 years has been followed  by Dr. Earlie Raveling.   He has been through multiple attempts at treating his Crohn's which  include Remicade, Imuran, Asacol.  He has had some episodes of bowel  obstruction which have tended to wax and wane; however, over the last 6  months he has had increasing trouble with partial bowel obstruction.  He  had a KUB at [facility] on March 06, 2007 which again  showed a partial bowel obstruction.  I did a small bowel series which  showed a high-grade stricture/narrowing  involving his terminal ileum.   I discussed with the patient about proceeding with surgery for his  Crohn's and the pros and cons of this operative exploration.   PAST MEDICAL HISTORY:  1. He has been seen by Dr.  Wynonia Lawman from a cardiac standpoint and had a      catheterization in 2005, but had no specific abnormality at cardiac      catheterization.  2. He has had a total prostatectomy for prostate cancer by Dr. Hessie Diener.  3. He has been diabetic for about 10 years, on insulin for the last 2      years.  Follow-up managed by Dr. Renato Shin.  4. He has also been noted to have an elevated cholesterol.   ADMISSION PHYSICAL EXAMINATION:  VITAL SIGNS:  His height was 5 feet, 11  inches, weight was 256 pounds.  Blood pressure was 145/71, pulse 81.  GENERAL:  He is a well-nourished, moderately obese white male, alert and  cooperative.  Did not have the typical habitus of a patient was with  Crohn's disease.  ABDOMEN:  Soft.  He had no mass, no fullness, no hernias, no guarding.   LABORATORY DATA:  On his admission labs, he had a white blood  count of  7000, hemoglobin of 12, hematocrit 36.  His platelet count was 278,000.  His urinalysis was unremarkable.  His sodium was 142, potassium 4.2,  chloride of 104, CO2 of 29, glucose of 99, creatinine 1.49.  His albumin  was 3.6, and his liver functions were all normal.   On the day of admission, he was taken to the operating room where he  underwent abdominal exploration.  I resected his cecum.  Interestingly,  his appendix looked inflamed (such as chronic appendicitis).  It was  stuck up against the lateral abdominal wall.  His terminal ileum had fat  creeping, consistent with Crohn's disease and palpation of the bowel  revealed strictures.  I also identified a Meckel's diverticula, so I  resected his small bowel from Meckel's diverticula down to his cecum,  and this represented about 40 cm of small bowel.  I also found least one  stricture proximal to this that I thought would be a potential problem,  and I did a stricturoplasty of that single area.  He had a couple other  areas of Crohn's which were not strictured which were left behind   because to have resected those would have required taking a considerable  amount of his small bowel out.   Postoperatively, he did well.  I left the NG tube in a little longer  because he had been on some  immunosuppressants, but by the 4th post op  day he was doing well enough to get NG tube out.  On the 5th post op  day, I started him on clear liquids that he has now tolerated, and he  has had a shower.  He is afebrile.  He is ready for discharge.   He did have some trouble with some urinary retention and required at  least 2 catheterizations, but this has resolved where he is having no  trouble with his urine now.  His final pathology showed an appendix with  no pathologic diagnosis.  His small bowel cecum showed a  peridiverticular inflammation, patchy acute and chronic ileitis and  inflammatory pseudopolyps.  There was no dysplasia identified.  He had  three negative lymph nodes.  This was all consistent with this Crohn's  disease.   DISCHARGE INSTRUCTIONS:  He is going to hold off on his Crohn's disease  medicines and will be in touch with Dr. Earlean Shawl.   DISCHARGE MEDICATIONS:  1. Buspirone 15 mg twice daily.  2. Isordil.  3. Hydrochlorothiazide 25 mg daily.  4. Atenolol 12.5 mg daily.  5. Naproxen 500 mg b.i.d. p.r.n.  6. Levoxyl 0.2 mg daily.  7. Niaspan 500 mg daily.  8. Fosamax weekly.  9. Insulin for his diabetes.   I have left his staples in.  He will check with [place] in a week for getting  staples removed.  He can be advanced to regular food, but only eat light  for a couple of days.  He should not drive for 4 days.  No heavy lifting for 3 weeks.  He was  given a Vicodin for pain.  He already has an appointment to see Dr. Loanne Drilling next Monday, April 13, 2007, which I told him to keep.  He  will see me next week for staple removal, and Dr. Earlean Shawl within 2-3  weeks for consideration of resumption of some of his Crohn's medicines.   DISCHARGE CONDITION:   Good.      Fenton Malling. Lucia Gaskins, M.D.  Electronically Signed  DHN/MEDQ  D:  04/07/2007  T:  04/07/2007  Job:  [id number]   cc:   Mayme Genta, M.D.  Fax: [place]. Loanne Drilling, [place]  [place] [address]

## 2010-10-26 NOTE — H&P (Signed)
NAME:  Devin [name], Devin [name]             ACCOUNT NO.:  [id number]   MEDICAL RECORD NO.:  [id number]          PATIENT TYPE:  INP   LOCATION:  [place]                         FACILITY:  [facility]   PHYSICIAN:  Sean A. Loanne Drilling, M.D. [facility] OF BIRTH:  May 07, 1941   DATE OF ADMISSION:  05/30/2004  DATE OF DISCHARGE:                                HISTORY & PHYSICAL   REASON FOR ADMISSION:  Cellulitis.   HISTORY OF PRESENT ILLNESS:  The patient is a 70 year old man with 5 days of  erythema and pain at the left leg.  He was seen here 2 days ago and  prescribed an oral antibiotic.  He states that the erythema on the left leg  has advanced since then.   He has been also suffering from edema recently, worse on the left leg, for  the past few months.   Regarding his diabetes, he states it has been well controlled.   PAST MEDICAL HISTORY:  1.  Crohn's disease.  2.  Papillary adenocarcinoma of the thyroid.  3.  Hypertension.  4.  Diabetes nephropathy.  5.  Psoriasis.  6.  Osteoporosis.  7.  Prostate cancer.  8.  Dyslipidemia.  9.  Hyperuricemia.  10. Anxiety.   MEDICATIONS:  1.  Allopurinol 300 mg daily.  2.  Glyset 25 mg three times a day.  3.  Naprosyn 500 mg twice a day.  4.  Fosamax 70 mg weekly.  5.  Tenormin 12.5 mg daily.  6.  Amaryl 1 mg q.h.s.  7.  Levoxyl 200 mcg daily.  8.  Prandin 4 mg t.i.d. (q.a.c.).  9.  Actos 45 mg a day.  10. Glucophage XR 1000 mg twice daily.  11. Niaspan 500 mg q.h.s.  12. Zestoretic 20/12.5 one twice daily.  13. BuSpar 15 mg twice a day.  14. Imuran 100 mg daily.  15. Asacol 1200 mg twice a day.   SOCIAL HISTORY:  The patient is married.  He owns a [employer].   FAMILY HISTORY:  No one else at home is ill.   REVIEW OF SYSTEMS:  Denies the following:  Fever, weight gain, weight loss,  chest pain, palpitations, syncope, shortness of breath, nausea, vomiting,  rectal bleeding, hematuria, urinary incontinence, decreased urinary force,  and  seizure.   PHYSICAL EXAMINATION:  VITAL SIGNS:  Blood pressure 120/60, heart rate is  84, temperature is 98.4, the weight is 247.  GENERAL:  No distress.  SKIN:  Not diaphoretic.  HEENT:  No periorbital swelling.  No proptosis.  Head is atraumatic.  Pharynx:  No erythema.  NECK:  Supple.  There is no palpable thyroid tissue.  LYMPHADENOPATHY:  There is no palpable lymphadenopathy at the neck nor at  the groin.  CHEST:  Clear to auscultation.  No respiratory distress.  CARDIOVASCULAR:  No JVD.  There is 2+ edema on the left leg, 1+ on the  right.  Regular rate and rhythm, no murmur.  Pedal pulses are intact.  ABDOMEN:  Soft, nontender.  No hepatosplenomegaly, no mass.  EXTREMITIES:  Feet:  Normal color and temperature except as noted below.  On  the left leg, there is moderate erythema of the left ankle and almost all of  the left leg.  NEUROLOGIC:  Alert, well-oriented.  Does not appear anxious nor depressed.  Sensation is intact to touch in the feet.   LABORATORY STUDIES:  On May 28, 2004:  WBC 22,900; hemoglobin 12.5;  platelets 282.   IMPRESSION:  1.  Cellulitis.  He has failed oral therapy.  2.  Edema, which is probably due to the Actos but exacerbated on the left      leg by #1.  3.  Type 2 diabetes, which is apparently well controlled.  4.  Other chronic medical problems as noted above.   PLAN:  1.  Admit and elevate left leg.  2.  Blood cultures.  3.  Antibiotics.  4.  Discontinue Actos.  5.  I discussed code status with the patient and he requests Full Code.  6.  I have told them that in view of the fact that he will need to abandon      the Actos, he will probably need insulin to control his glucose, but he      refuses it at this time.      SAE/MEDQ  D:  05/30/2004  T:  05/30/2004  Job:  [id number]

## 2010-10-26 NOTE — H&P (Signed)
[facility]  Patient:    Devin [name], Devin [name] Visit Number: [id number] MRN: [id number]          Service Type: SUR Location: 1S X004 01 Attending Physician:  Deeann Cree Dictated by:   Lucina Mellow. Terance Hart, M.D. Admit Date:  06/29/2001   CC:         Gloris Ham, M.D. [facility]   History and Physical  HISTORY OF PRESENT ILLNESS:  This 70 year old white male is admitted for radical retropubic prostatectomy and lymph node dissection.  He had a PSA of 4.09 with a free PSA percentage of 10.8% on April 14, 2001, at which time he was referred by Dr. Renato Shin for prostate exam.  He had a slightly firm ridge in the right lobe of the prostate but was not a discrete nodule.  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  He was counseled about therapeutic measures such as surgery and radiation, and he and his wife opted radiation therapy.  He did have a bone scan that was negative for metastatic disease.  He also had a recent colonoscopy that was unremarkable.  He has donated two units of his own autologous blood.  He and his wife were counseled about the risk of impotence, incontinence, rectal injury, or hemorrhage.  PAST MEDICAL HISTORY: 1. Diabetes, controlled with oral agents. 2. Crohns disease. 3. Irritable bowel syndrome. 4. Hypertension.  PAST SURGICAL HISTORY: 1. Thyroidectomy for carcinoma of the thyroid. 2. Previous bilateral vasectomy. 3. T&A as a child.  MEDICATIONS: 1. Glucophage 1000 mg b.i.d. 2. ______  4 mg t.i.d. 3. Levoxyl 0.2 mg daily. 4. Amaryl 0.125 mg daily. 5. Actos 45 mg daily. 6. Atenolol 12.5 mg daily. 7. Altace 10 mg b.i.d. 8. Diovan 160 mg b.i.d. 9. Furosemide 40 mg a day.  ALLERGIES:  None.  SOCIAL HISTORY:  Tobacco, none.  Alcohol, none.  FAMILY HISTORY:   Noncontributory.  REVIEW OF SYSTEMS:  Noted on the health history form.  PHYSICAL EXAMINATION:  GENERAL:  Pleasant white male appearing stated age.  NECK:  Supple.  CHEST:  Clear.  HEART:  Heart sounds regular.  ABDOMEN:  Soft and nontender.  GENITOURINARY:  External genitalia unremarkable.  RECTAL:  On prostate exam vague firmness in the right lobe.  EXTREMITIES:  No edema.  IMPRESSION: 1. Prostatic carcinoma. 2. Diabetes. 3. Hypertension. 4. Hypothyroidism with history of thyroidectomy for carcinoma of the thyroid. 5. Irritable bowel syndrome. 6. History of Crohns disease. Dictated by:   Lucina Mellow. Terance Hart, M.D. Attending Physician:  Deeann Cree DD:  06/29/01 TD:  06/29/01 Job: [id number]

## 2010-10-26 NOTE — Op Note (Signed)
[facility]  Patient:    Devin [name], Devin [name] Visit Number: [id number] MRN: [id number]          Service Type: SUR Location: 3W 0372 01 Attending Physician:  Deeann Cree Dictated by:   Lucina Mellow. Terance Hart, M.D. Proc. Date: 06/29/01 Admit Date:  06/29/2001   CC:         Gloris Ham, M.D. [facility]   Operative Report  PREOPERATIVE DIAGNOSIS:  Prostatic carcinoma.  POSTOPERATIVE DIAGNOSIS:  Prostatic carcinoma.  PROCEDURE:  Radical retropubic prostatectomy and bilateral pelvic lymph node dissection.  SURGEON:  Lucina Mellow. Terance Hart, M.D.  ASSISTANT:  Thana Farr. Karsten Ro, M.D.  ANESTHESIA:  General.  INDICATIONS FOR PROCEDURE:  This 70 year old white male had a prostate biopsy in November 2002 after presenting with a PSA of 4.09 with free PSA percentage of 10.8% and a slight firmness in the right lobe of the prostate. 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  DESCRIPTION OF PROCEDURE:  The patient was brought to the operating room, placed in the supine position. The lower abdomen and external genitalia were prepped and draped in the usual fashion. A 24 French 30 cc Foley was inserted and balloon filled to 30 cc. A lower midline incision was made at the symphysis pubis up toward the umbilicus. The rectus fascia was divided in the midline. The pelvic retroperitoneal spaces were dissected out on each side and each obturator lymph node pack was dissected out without difficulty and sent to pathology for permanent section. Hemostasis and lymphostasis was obtained by black silk ties and metal hemoclips. The obturator nerve and major vessels were left intact. The endopelvic fascia was then taken down bilaterally with care to take a wider margin on the right side where all his pathology was found on biopsy.  The preprostatic superficial veins were also ligated off with a 3-0 chromic catgut. The puboprostatic ligaments were taken down with just blunt dissection. A Hohenfeltner clamp was placed around the dorsal vein complex and a heavy tie secured on this structure. The dorsal vein complex was then divided and the urethra divided just beyond the apex of the prostate. The Hohenfeltner clamp was placed around the posterior urethra and after pulling up the Foley balloon the posterior urethra was divided and the prostate came up nicely off the rectum. The puboprostatic ligaments were divided and clamped with metal hemoclips. The seminal vesicle actually went out quite laterally on each side and in the course of taking down the puboprostatic ligaments, the tips of the seminal vesicle were divided and ligated with a metal hemoclip and also the adjacent ampulla of each vas were divided and clipped. The anterior bladder neck was then opened and the ureteral orifices were well away from the bladder neck excreting indigo carmine stained urine. A small median lobe was included with the resection that divided the mucosa at the posterior bladder neck and further hemostasis. The bladder neck was obtained by the use of electrocautery and chromic catgut sutures. The specimen was removed intact with a good apex. Two smaller bleeders in the pelvis were controlled and the bladder was reapproximated to the bladder neck musculature with interrupted 4-0 chromic catgut. Posterior bladder neck was closed with running 2-0 chromic catgut. A 22 French 5 cc Foley was inserted per urethra and anastomosis between the bladder neck and urethra was completed using  the 2-0 Vicryl at 2, 5, 7, 10 and 12 oclock and a #1 Prolene brought through the tip of the Foley and brought through the anterior bladder wall before closure of the anastomosis and then through the abdominal wall later sewn to the skin level with a button. After the  anastomosis was tied down, there was good water tight closure. We irrigated the bladder with antibiotic solution. Through the left ______ incision, a stab wound was made through which a Blake drain was positioned draining the prevesical space and sutured in place with black silk. The wound was irrigated and the fascia closed with running #1 PDS and the skin closed with skin staples. The wound was clean, dressed with dry sterile gauze dressings. A Blake drain was connected to bulb suction. A Foley was placed on traction. Sponge, needle and instrument counts were correct. Estimated blood loss is 350 cc. He was taken to the recovery room in good condition. Dictated by:   Lucina Mellow. Terance Hart, M.D. Attending Physician:  Deeann Cree DD:  06/29/01 TD:  06/30/01 Job: [id number]

## 2010-10-26 NOTE — Discharge Summary (Signed)
[facility]  Patient:    Devin [name], Devin [name] Visit Number: [id number] MRN: [id number]          Service Type: SUR Location: 3W 0372 01 Attending Physician:  Deeann Cree Dictated by:   Lucina Mellow. Terance Hart, M.D. Admit Date:  06/29/2001 Discharge Date: 07/02/2001   CC:         Gloris Ham, M.D.                           Discharge Summary  FINAL DIAGNOSES: 1. Prostatic carcinoma. 2. Diabetes mellitus. 3. Hypertension. 4. 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 5. Irritable bowel syndrome. 6. History of Crohns disease.  PROCEDURE:  Radical retropubic prostatectomy, bilateral pelvic lymph node dissection on 06/29/01.  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  He was counseled about therapeutic measures, and opted for radical prostatectomy.  He has diabetes, well controlled with oral agents.  He also has a history of bowel difficulties, and also well controlled hypertension.  PHYSICAL EXAMINATION:  Noncontributory, except for vague firmness in the right lobe of the prostate.  HOSPITAL COURSE:  After admission, he underwent radical prostatectomy and lymph node dissection.  His postoperative course was quite benign.  His diabetes was managed by the medical service.  His Blake drain was removed on the day of discharge.  His Foley catheter was connected to a leg bag.  His final pathology showed a Gleason grade 7 carcinoma confined to the right lobe of the prostate, but no seminal vesicle disease, and negative lymph nodes.  He was sent home on limited activity and a diabetic diet.  FOLLOWUP:  He will return to the office next week in followup.  CONDITION ON DISCHARGE:  Good.  DISCHARGE MEDICATIONS:  1. Vicodin for pain.  2.  Glucophage 1000 mg b.i.d.  3. Prandin 4 mg t.i.d.  4. Levoxyl 0.2 mg q.d.  5. Amaryl 0.5 mg q.d.  6. Actos 45 mg q.d.  7. Atenolol 12.5 mg q.d.  8. Altace 10 mg b.i.d.  9. Diovan 160 mg b.i.d. 10. Furosemide 40 mg q.d. Dictated by:   Lucina Mellow. Terance Hart, M.D. Attending Physician:  Deeann Cree DD:  07/02/01 TD:  07/03/01 Job: [id number]

## 2010-10-26 NOTE — Consult Note (Signed)
[facility]  Patient:    Devin [name], Devin [name] Visit Number: [id number] MRN: [id number]          Service Type: SUR Location: 3W 0372 01 Attending Physician:  Deeann Cree Dictated by:   Marletta Lor, M.D. [facility] Admit Date:  06/29/2001                            Consultation Report  HISTORY OF PRESENT ILLNESS:  The patient is a 70 year old gentleman, who is several hours postoperative radical prostatectomy for prostate cancer.  He has a history of type 2 diabetes, as well as hypertension, remote thyroid cancer, and also a history of inflammatory bowel disease.  A consult was requested for management of his diabetes in the perioperative period.  The patients diabetic control has been excellent.  Blood sugars are usually in the 90-120 range fasting in the morning.  The last two hemoglobin A1Cs have been 6.5 and 6.0.  MEDICATIONS:  His present medical regimen includes the following: 1. Glucophage 1 g b.i.d. 2. Prandin 4 mg t.i.d. a.c. 3. Actos 45 mg daily. 4. Amaryl 0.5 mg every morning. 5. Levoxyl 0.2 mg daily. 6. Atenolol 12.5 mg daily. 7. Altace 10 mg daily. 8. Diovan 160 mg b.i.d. 9. Lasix 40 mg daily.  PHYSICAL EXAMINATION:  VITAL SIGNS:  Afebrile, blood pressure 110/70, pulse rate 72, respiratory rate normal.  GENERAL APPEARANCE:  A well-developed, alert, healthy-appearing male in no acute distress.  CHEST:  Clear anterolaterally.  CARDIOVASCULAR:  Normal S1 and S2.  No murmurs or gallops.  ABDOMEN:  Benign.  Mildly overweight.  EXTREMITIES:  Negative.  IMPRESSION:  Diabetes mellitus, type 2.  DISPOSITION:  The patient is present NPO.  Will track blood sugars q.i.d. and cover with sliding scale regimen of Humulin Regular.  When his diet is advanced, will adjust his diabetic management.  Will hold his Glucophage during his hospital stay, but will resume his Actos 45 mg daily. Dictated by:   Marletta Lor, M.D.  [employer] Attending Physician:  Deeann Cree DD:  06/29/01 TD:  06/30/01 Job: [id number]

## 2010-10-26 NOTE — Discharge Summary (Signed)
NAME:  Devin [name], Devin [name] NO.:  [id number]   MEDICAL RECORD NO.:  [id number]          PATIENT TYPE:  INP   LOCATION:  0467                         FACILITY:  [facility]   PHYSICIAN:  Gwendolyn Grant, M.D. LHCDATE OF BIRTH:  [birth year]   DATE OF ADMISSION:  05/30/2004  DATE OF DISCHARGE:  06/01/2004                                 DISCHARGE SUMMARY   DISCHARGE DIAGNOSES:  1.  Left lower extremity cellulitis, improved.  Tolerating oral Augmentin      with improvement.  Continue outpatient therapy.  2.  Type 2 diabetes.  3.  History of Crohn's disease.  4.  History of papillary adenocarcinoma of the thyroid.  5.  History of hypertension.  6.  History of psoriasis.  7.  History of osteoporosis.  8.  History of prostate cancer.  9.  History of dyslipidemia.  10. History of anxiety.   DISCHARGE MEDICATIONS:  Augmentin 875 p.o. b.i.d. x remaining 8-1/2 days to  complete 2-week total course of treatment.  Other medications are as prior  to admission include allopurinol 300 mg p.o. daily, Glycet 25 mg t.i.d. at  a.c., Naprosyn 500 mg b.i.d., Fosamax 70 mg p.o. every week, Tenormin 2.5 mg  p.o. daily, Amaryl 1 mg p.o. q.h.s., Levoxyl 200 mcg p.o. daily, Prandin 4  mg t.i.d. q.a.c.  Discontinue Actos.  The patient will no longer be taking  Actos as prior to admission.  Glucophage XR 1 g p.o. b.i.d., Niaspan 500 mg  p.o. q.h.s., Zestoretic 20/12.5 1 p.o. b.i.d., BuSpar 15 mg p.o. b.i.d.,  Imuran 100 mg p.o. daily, Asacol 1200 mg p.o. b.i.d.   DISPOSITION:  The patient is discharged to home in medically stable and  improved condition.  He is afebrile, tolerating p.o., and self ambulatory  with no assistance.  His leg is markedly improved.  No significant pain, and  decreased erythema.  Plans are reviewed with the patient for medication  treatment post discharge as well as the need for followup ________ chronic  medical issues.  The patient is discharged home.  Hospital followup  will be  arranged by patient with his primary care physician, Dr. Renato Shin, to be  evaluated in approximately one week as needed depending on the symptoms of  his leg.   HOSPITAL COURSE:  1.  Left lower extremity cellulitis.  The patient is a pleasant 70 year old      type 2 diabetic who has had one week of increased erythema and swelling      of his left lower extremity.  He was begun on oral antibiotics, but      after 3 pills, he felt that his leg was continuing to get worse, and      therefore, referred for IV antibiotics and further evaluation to rule      out DVT and other problems.  He was begun on IV Unasyn, and even prior      to this, his white count had already shown improvement with even the 3      oral pills, decreasing his white count from 22,900 down to 13,100.  Ultrasound showed no DVT or Baker's cyst.  With elevation of his leg      with a day of rest, the patient noted there was marked improvement.  Due      to loss of IV access, he was changed back to his oral p.o. Augmentin      which he has tolerated well with continued improvement.  The patient was      reassured to continue his medical therapy as well as elevation while at      rest and complete a total of 2-week course.  There was no wound or      drainage to be cultured, and Augmentin is felt to be sufficient coverage      for diabetic cellulitis.  The patient is instructed to return to the      emergency room or contact his primary care physician for increased      swelling, redness, pain or fever over the next few days prior to      followup.  2.  Acute-on-chronic renal insufficiency.  The patient had a mild bump in      his creatinine on admission of 2.1.  Baseline is in the mid 1.0 range.      His Glucophage was held for 2 days, and he was gently hydrated during      the first 24 hours.  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  Outpatient followup with primary MD as needed.  3.  Type 2 diabetes.  The patient was covered with sliding-scale insulin      during his hospitalization.  Actos was discontinued because of the      swelling, and will need to be reevaluated in the future regarding the      use of this medication.  The patient is reluctant to use insulin.      Deferred further management to primary care physician, but overall good      control with a hemoglobin A1c of 5.9.  4.  Hypothyroidism.  The patient had a normal TSH.  Continue home dose      Synthroid.  5.  Other medical issues.  The patient's other medical issues are as prior      to admission.  No other changes were made in his regimen except as      listed above.     [place]   VL/MEDQ  D:  06/01/2004  T:  06/01/2004  Job:  [id number]

## 2010-10-26 NOTE — Cardiovascular Report (Signed)
   NAME:  Devin [name], Devin [name] NO.:  [id number]   MEDICAL RECORD NO.:  [id number]                   PATIENT TYPE:  OIB   LOCATION:  2899                                 FACILITY:  [place]   PHYSICIAN:  W. Doristine Church., M.D.         DATE OF BIRTH:  [birth year]   DATE OF PROCEDURE:  03/17/2003  DATE OF DISCHARGE:                              CARDIAC CATHETERIZATION   HISTORY:  The patient is a 70 year old male with diabetes, hyperlipidemia,  and hypertension who had some mild dyspnea on climbing stairs, but no chest  pain.  A Cardiolite scan showed significant ST depression and T-wave  inversions in recovery with poor exercise tolerance, although the images  were negative.  Because of the reduced exercise tolerance and abnormal EKG,  catheterization was advised.   PROCEDURE:  Left heart catheterization with coronary angiograms and left  ventriculogram.   COMMENTS ABOUT PROCEDURE:  An i-STAT was done prior to the procedure showing  a normal hematocrit and normal renal function and potassium.  This is  because routine laboratories had been lost that had been drawn earlier in  the week.  The catheterization was done without complications through single  anterior needle wall stick.  A 30 mL ventriculogram was performed.  The  patient tolerated the procedure well without complications.   HEMODYNAMIC DATA:  1. Aorta post contrast 127/65.  2. Left ventricle post contrast 127/12-20.   ANGIOGRAPHIC DATA:  1. Left ventriculogram:  Performed in the 30 degree RAO projection.  The     aortic valve was normal.  The mitral valve was normal.  The left     ventricle appears normal in size.  Estimated ejection fraction 60%.     Coronary arteries arise and distribute normally.  No significant coronary     calcification is noted.  2. Left main coronary artery is normal.  3. Left anterior descending has a minimal 10-20% proximal narrowing with     only minor  irregularities.  4. Circumflex contains an intermediate branch and a marginal branch and has     no significant stenoses.  5. Right coronary artery is a dominant vessel with mild irregularities, but     no significant stenoses.   IMPRESSION:  1. Very minimal coronary artery disease with no significant __________     obstructive stenoses noted.  2. Normal left ventricular function.                                               Kerry Hough., M.D.    WST/MEDQ  D:  03/17/2003  T:  03/17/2003  Job:  [id number]   cc:   Hilliard Clark A. Loanne Drilling, M.D. [facility]

## 2010-11-08 ENCOUNTER — Other Ambulatory Visit: Payer: Self-pay | Admitting: Endocrinology

## 2011-01-15 ENCOUNTER — Other Ambulatory Visit: Payer: Self-pay | Admitting: Endocrinology

## 2011-02-06 ENCOUNTER — Other Ambulatory Visit: Payer: Self-pay | Admitting: Endocrinology

## 2011-02-19 ENCOUNTER — Other Ambulatory Visit: Payer: Self-pay | Admitting: Endocrinology

## 2011-03-11 LAB — COMPREHENSIVE METABOLIC PANEL
ALT: 29
AST: 34
Albumin: 3.6
Alkaline Phosphatase: 74
BUN: 23
CO2: 24
Calcium: 9.4
Chloride: 108
Creatinine, Ser: 1.63 — ABNORMAL HIGH
GFR calc Af Amer: 52 — ABNORMAL LOW
GFR calc non Af Amer: 43 — ABNORMAL LOW
Glucose, Bld: 121 — ABNORMAL HIGH
Potassium: 3.7
Sodium: 142
Total Bilirubin: 0.8
Total Protein: 6.3

## 2011-03-11 LAB — GLUCOSE, CAPILLARY
Glucose-Capillary: 111 — ABNORMAL HIGH
Glucose-Capillary: 113 — ABNORMAL HIGH
Glucose-Capillary: 120 — ABNORMAL HIGH
Glucose-Capillary: 132 — ABNORMAL HIGH
Glucose-Capillary: 146 — ABNORMAL HIGH
Glucose-Capillary: 154 — ABNORMAL HIGH
Glucose-Capillary: 154 — ABNORMAL HIGH
Glucose-Capillary: 154 — ABNORMAL HIGH
Glucose-Capillary: 161 — ABNORMAL HIGH
Glucose-Capillary: 169 — ABNORMAL HIGH
Glucose-Capillary: 179 — ABNORMAL HIGH
Glucose-Capillary: 238 — ABNORMAL HIGH

## 2011-03-11 LAB — DIFFERENTIAL
Basophils Absolute: 0
Basophils Relative: 1
Eosinophils Absolute: 0.2
Eosinophils Relative: 2
Lymphocytes Relative: 15
Lymphs Abs: 1.4
Monocytes Absolute: 0.8
Monocytes Relative: 9
Neutro Abs: 6.7
Neutrophils Relative %: 74

## 2011-03-11 LAB — CBC
HCT: 43.9
Hemoglobin: 14.6
MCHC: 33.4
MCV: 89.6
Platelets: 295
RBC: 4.9
RDW: 13.7
WBC: 9

## 2011-03-11 LAB — HEMOGLOBIN A1C
Hgb A1c MFr Bld: 7.1 — ABNORMAL HIGH
Mean Plasma Glucose: 157

## 2011-03-20 LAB — URINALYSIS, ROUTINE W REFLEX MICROSCOPIC
Bilirubin Urine: NEGATIVE
Glucose, UA: NEGATIVE
Hgb urine dipstick: NEGATIVE
Ketones, ur: NEGATIVE
Nitrite: NEGATIVE
Protein, ur: NEGATIVE
Specific Gravity, Urine: 1.022
Urobilinogen, UA: 0.2
pH: 5.5

## 2011-03-20 LAB — CBC
HCT: 29.7 — ABNORMAL LOW
HCT: 35.3 — ABNORMAL LOW
HCT: 35.4 — ABNORMAL LOW
HCT: 36.3 — ABNORMAL LOW
Hemoglobin: 10.2 — ABNORMAL LOW
Hemoglobin: 12 — ABNORMAL LOW
Hemoglobin: 12.2 — ABNORMAL LOW
Hemoglobin: 12.4 — ABNORMAL LOW
MCHC: 33.9
MCHC: 34.2
MCHC: 34.3
MCHC: 34.3
MCV: 97.3
MCV: 97.5
MCV: 97.5
MCV: 97.6
Platelets: 235
Platelets: 248
Platelets: 261
Platelets: 278
RBC: 3.05 — ABNORMAL LOW
RBC: 3.62 — ABNORMAL LOW
RBC: 3.64 — ABNORMAL LOW
RBC: 3.72 — ABNORMAL LOW
RDW: 15.2 — ABNORMAL HIGH
RDW: 15.4 — ABNORMAL HIGH
RDW: 15.5 — ABNORMAL HIGH
RDW: 16.1 — ABNORMAL HIGH
WBC: 5.9
WBC: 7
WBC: 8.7
WBC: 9

## 2011-03-20 LAB — BASIC METABOLIC PANEL
BUN: 10
BUN: 13
BUN: 8
CO2: 18 — ABNORMAL LOW
CO2: 23
CO2: 30
Calcium: 8.3 — ABNORMAL LOW
Calcium: 8.5
Calcium: 8.5
Chloride: 106
Chloride: 108
Chloride: 113 — ABNORMAL HIGH
Creatinine, Ser: 1.06
Creatinine, Ser: 1.3
Creatinine, Ser: 1.4
GFR calc Af Amer: >60
GFR calc Af Amer: >60
GFR calc Af Amer: >60
GFR calc non Af Amer: 51 — ABNORMAL LOW
GFR calc non Af Amer: 56 — ABNORMAL LOW
GFR calc non Af Amer: >60
Glucose, Bld: 101 — ABNORMAL HIGH
Glucose, Bld: 117 — ABNORMAL HIGH
Glucose, Bld: 99
Potassium: 4.6
Potassium: 4.7
Potassium: 4.8
Sodium: 138
Sodium: 138
Sodium: 141

## 2011-03-20 LAB — DIFFERENTIAL
Basophils Absolute: 0
Basophils Absolute: 0
Basophils Relative: 0
Basophils Relative: 0
Eosinophils Absolute: 0
Eosinophils Absolute: 0.1
Eosinophils Relative: 1
Eosinophils Relative: 2
Lymphocytes Relative: 13
Lymphocytes Relative: 7 — ABNORMAL LOW
Lymphs Abs: 0.6 — ABNORMAL LOW
Lymphs Abs: 0.9
Monocytes Absolute: 0.4
Monocytes Absolute: 0.5
Monocytes Relative: 5
Monocytes Relative: 7
Neutro Abs: 5.4
Neutro Abs: 7.6
Neutrophils Relative %: 78 — ABNORMAL HIGH
Neutrophils Relative %: 88 — ABNORMAL HIGH

## 2011-03-20 LAB — COMPREHENSIVE METABOLIC PANEL
ALT: 27
AST: 29
Albumin: 3.6
Alkaline Phosphatase: 50
BUN: 16
CO2: 29
Calcium: 9.4
Chloride: 104
Creatinine, Ser: 1.49
GFR calc Af Amer: 57 — ABNORMAL LOW
GFR calc non Af Amer: 47 — ABNORMAL LOW
Glucose, Bld: 99
Potassium: 4.2
Sodium: 142
Total Bilirubin: 1.2
Total Protein: 5.8 — ABNORMAL LOW

## 2011-04-29 ENCOUNTER — Ambulatory Visit (INDEPENDENT_AMBULATORY_CARE_PROVIDER_SITE_OTHER): Payer: Medicare Other | Admitting: Endocrinology

## 2011-04-29 ENCOUNTER — Other Ambulatory Visit (INDEPENDENT_AMBULATORY_CARE_PROVIDER_SITE_OTHER): Payer: Medicare Other

## 2011-04-29 ENCOUNTER — Encounter: Payer: Self-pay | Admitting: Endocrinology

## 2011-04-29 DIAGNOSIS — K5090 Crohn's disease, unspecified, without complications: Secondary | ICD-10-CM

## 2011-04-29 DIAGNOSIS — I10 Essential (primary) hypertension: Secondary | ICD-10-CM

## 2011-04-29 DIAGNOSIS — M109 Gout, unspecified: Secondary | ICD-10-CM

## 2011-04-29 DIAGNOSIS — E890 Postprocedural hypothyroidism: Secondary | ICD-10-CM

## 2011-04-29 DIAGNOSIS — C73 Malignant neoplasm of thyroid gland: Secondary | ICD-10-CM

## 2011-04-29 DIAGNOSIS — E109 Type 1 diabetes mellitus without complications: Secondary | ICD-10-CM

## 2011-04-29 DIAGNOSIS — Z79899 Other long term (current) drug therapy: Secondary | ICD-10-CM

## 2011-04-29 DIAGNOSIS — F411 Generalized anxiety disorder: Secondary | ICD-10-CM

## 2011-04-29 DIAGNOSIS — R972 Elevated prostate specific antigen [PSA]: Secondary | ICD-10-CM

## 2011-04-29 DIAGNOSIS — Z0000 Encounter for general adult medical examination without abnormal findings: Secondary | ICD-10-CM

## 2011-04-29 DIAGNOSIS — Z8546 Personal history of malignant neoplasm of prostate: Secondary | ICD-10-CM

## 2011-04-29 LAB — HEPATIC FUNCTION PANEL
ALT: 30 U/L (ref 0–53)
AST: 31 U/L (ref 0–37)
Albumin: 4 g/dL (ref 3.5–5.2)
Alkaline Phosphatase: 71 U/L (ref 39–117)
Bilirubin, Direct: 0.2 mg/dL (ref 0.0–0.3)
Total Bilirubin: 0.7 mg/dL (ref 0.3–1.2)
Total Protein: 7.1 g/dL (ref 6.0–8.3)

## 2011-04-29 LAB — URINALYSIS, ROUTINE W REFLEX MICROSCOPIC
Bilirubin Urine: NEGATIVE
Hgb urine dipstick: NEGATIVE
Ketones, ur: NEGATIVE
Leukocytes, UA: NEGATIVE
Nitrite: NEGATIVE
Specific Gravity, Urine: 1.02 (ref 1.000–1.030)
Total Protein, Urine: NEGATIVE
Urine Glucose: NEGATIVE
Urobilinogen, UA: 0.2 (ref 0.0–1.0)
pH: 6 (ref 5.0–8.0)

## 2011-04-29 LAB — BASIC METABOLIC PANEL
BUN: 22 mg/dL (ref 6–23)
CO2: 26 mEq/L (ref 19–32)
Calcium: 9.1 mg/dL (ref 8.4–10.5)
Chloride: 104 mEq/L (ref 96–112)
Creatinine, Ser: 1.7 mg/dL — ABNORMAL HIGH (ref 0.4–1.5)
GFR: 43.07 mL/min — ABNORMAL LOW (ref >60.00)
Glucose, Bld: 137 mg/dL — ABNORMAL HIGH (ref 70–99)
Potassium: 3.9 mEq/L (ref 3.5–5.1)
Sodium: 139 mEq/L (ref 135–145)

## 2011-04-29 LAB — LIPID PANEL
Cholesterol: 100 mg/dL (ref 0–200)
HDL: 44.1 mg/dL (ref >39.00)
LDL Cholesterol: 38 mg/dL (ref 0–99)
Total CHOL/HDL Ratio: 2
Triglycerides: 91 mg/dL (ref 0.0–149.0)
VLDL: 18.2 mg/dL (ref 0.0–40.0)

## 2011-04-29 LAB — CBC WITH DIFFERENTIAL/PLATELET
Basophils Absolute: 0.1 10*3/uL (ref 0.0–0.1)
Basophils Relative: 0.4 % (ref 0.0–3.0)
Eosinophils Absolute: 0.2 10*3/uL (ref 0.0–0.7)
Eosinophils Relative: 1.2 % (ref 0.0–5.0)
HCT: 45.7 % (ref 39.0–52.0)
Hemoglobin: 15.3 g/dL (ref 13.0–17.0)
Lymphocytes Relative: 13.5 % (ref 12.0–46.0)
Lymphs Abs: 1.8 10*3/uL (ref 0.7–4.0)
MCHC: 33.5 g/dL (ref 30.0–36.0)
MCV: 91.8 fl (ref 78.0–100.0)
Monocytes Absolute: 1.1 10*3/uL — ABNORMAL HIGH (ref 0.1–1.0)
Monocytes Relative: 8.5 % (ref 3.0–12.0)
Neutro Abs: 10.2 10*3/uL — ABNORMAL HIGH (ref 1.4–7.7)
Neutrophils Relative %: 76.4 % (ref 43.0–77.0)
Platelets: 265 10*3/uL (ref 150.0–400.0)
RBC: 4.97 Mil/uL (ref 4.22–5.81)
RDW: 13.6 % (ref 11.5–14.6)
WBC: 13.3 10*3/uL — ABNORMAL HIGH (ref 4.5–10.5)

## 2011-04-29 LAB — TSH: TSH: 0.57 u[IU]/mL (ref 0.35–5.50)

## 2011-04-29 LAB — IBC PANEL
Iron: 34 ug/dL — ABNORMAL LOW (ref 42–165)
Saturation Ratios: 9.5 % — ABNORMAL LOW (ref 20.0–50.0)
Transferrin: 255.1 mg/dL (ref 212.0–360.0)

## 2011-04-29 LAB — MICROALBUMIN / CREATININE URINE RATIO
Creatinine,U: 121.9 mg/dL
Microalb Creat Ratio: 0.2 mg/g (ref 0.0–30.0)
Microalb, Ur: 0.3 mg/dL (ref 0.0–1.9)

## 2011-04-29 LAB — PSA: PSA: 4.27 ng/mL — ABNORMAL HIGH (ref 0.10–4.00)

## 2011-04-29 LAB — HEMOGLOBIN A1C: Hgb A1c MFr Bld: 6.5 % (ref 4.6–6.5)

## 2011-04-29 LAB — URIC ACID: Uric Acid, Serum: 7 mg/dL (ref 4.0–7.8)

## 2011-04-29 NOTE — Progress Notes (Signed)
Subjective:    Patient ID: Devin [name], male    DOB: [birth year], 70 y.o.   MRN: [id number]  HPI The state of at least three ongoing medical problems is addressed today: DM: no cbg record, but states cbg's are well-controlled.  It is highest in am (high-100's).  Denies hypoglycemia. Prostate cancer:  psa has been normal.  Denies urinary hesitancy. HTN: pt has had borderline creat several times. He takes meds as rx'ed. Past Medical History  Diagnosis Date  . VENTRAL HERNIA 04/14/2008  . PROSTATE CANCER, HX OF 12/29/2006  . OSTEOPOROSIS 12/29/2006  . 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 Hypocalcemia 04/13/2007  . HYPERTENSION 12/29/2006  . GOUT 12/29/2006  . DIABETES MELLITUS, TYPE I 12/29/2006  . CROHN'S DISEASE 04/14/2008  . 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 ANXIETY 12/29/2006  . Impotence   . Psoriasis     Past Surgical History  Procedure Date  . Total thyroidectomy 1997  . Tonsillectomy   . Cardiac catheterization 03/17/2003  . Stress cardiolite 02/17/2003  . Venous doppler 05/30/2004  . Electrocardiogram 03/27/2006  . Hernia repair 2009    ventral hernia    History   Social History  . Marital Status: Married    Spouse Name: N/A    Number of Children: N/A  . Years of Education: N/A   Occupational History  . Retired    Social History Main Topics  . Smoking status: Former Games developer  . Smokeless tobacco: Not on file  . Alcohol Use: Not on file  . Drug Use: Not on file  . Sexually Active: Not on file   Other Topics Concern  . Not on file   Social History Narrative  . No narrative on file    Current Outpatient Prescriptions on File Prior to Visit  Medication Sig Dispense Refill  . atenolol (TENORMIN) 25 MG tablet take 1/2 tablet by mouth once daily  45 tablet  1  . busPIRone (BUSPAR) 15 MG tablet  Take 15 mg by mouth 2 (two) times daily.        . Glucose Blood (ASCENSIA CONTOUR TEST VI) Use as directed to test two times a day       . HUMALOG KWIKPEN 100 UNIT/ML injection INJECT BEFORE MEALS 3 TIMES DAILY AS DIRECTED 15-20-35 UNITS  30 mL  2  . insulin NPH (HUMULIN N,NOVOLIN N) 100 UNIT/ML injection Inject 20 Units into the skin at bedtime.        . Insulin Pen Needle (B-D ULTRAFINE III SHORT PEN) 31G X 8 MM MISC Use as directed four times a day       . levothyroxine (SYNTHROID, LEVOTHROID) 200 MCG tablet take 1 tablet by mouth once daily  90 tablet  1  . lisinopril-hydrochlorothiazide (PRINZIDE,ZESTORETIC) 20-12.5 MG per tablet take 1 tablet by mouth once daily  90 tablet  0  . mesalamine (ASACOL) 400 MG EC tablet Take 4 tablets by mouth two times a day       . tadalafil (CIALIS) 20 MG tablet Take 20 mg by mouth as needed.        . terbinafine (LAMISIL) 250 MG tablet Take 250 mg by mouth daily.          No Known Allergies  Family History  Problem Relation Age of Onset  . Cancer Neg Hx  No cancer in the patient's immediate family, except of course for the patient himself, as noted    BP 116/58  Pulse 80  Temp(Src) 98.3 F (36.8 C) (Oral)  Ht 6' (1.829 m)  Wt 240 lb 12.8 oz (109.226 kg)  BMI 32.66 kg/m2  SpO2 98%    Review of Systems Denies chest pain/sob/weight change/brbpr/hematuria.      Objective:   Physical Exam VITAL SIGNS:  See vs page GENERAL: no distress Neck: a healed scar is present.  i do not appreciate a nodule in the thyroid or elsewhere in the neck Pulses: dorsalis pedis intact bilat.   Feet: no deformity.  no ulcer on the feet.  feet are of normal color and temp.  Trace bilat leg edema Neuro: sensation is intact to touch on the feet Lab Results  Component Value Date   PSA 4.27* 04/29/2011   PSA 1.39 04/23/2010   PSA 1.23 04/18/2009   Lab Results  Component Value Date   HGBA1C 6.5 04/29/2011  (i reviewed creat).    Assessment & Plan:  DM,  well-controlled Prostate cancer, with recurrent elev psa   Subjective:   Patient here for Medicare annual wellness visit and management of other chronic and acute problems. He says he has had zostavax and pneumovax  Risk factors: advanced age    Roster of Physicians Providing Medical Care to Patient: Opthal: hecker Gi: medoff Derm: turner Urol: none   Activities of Daily Living: In your present state of health, do you have any difficulty performing the following activities?:  Preparing food and eating?: No  Bathing yourself: No  Getting dressed: No  Using the toilet: No  Moving around from place to place: No  In the past year have you fallen or had a near fall?: No    Home Safety: Has smoke detector and wears seat belts. No firearms. No excess sun exposure.  Diet and Exercise  Current exercise habits:  Pt says "fair." Dietary issues discussed: pt reports a healthy diet   Depression Screen  Q1: Over the past two weeks, have you felt down, depressed or hopeless? no  Q2: Over the past two weeks, have you felt little interest or pleasure in doing things? no   The following portions of the patient's history were reviewed and updated as appropriate: allergies, current medications, past family history, past medical history, past social history, past surgical history and problem list.  Past Medical History  Diagnosis Date  . VENTRAL HERNIA 04/14/2008  . PROSTATE CANCER, HX OF 12/29/2006  . OSTEOPOROSIS 12/29/2006  . 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 Hypocalcemia 04/13/2007  . HYPERTENSION 12/29/2006  . GOUT 12/29/2006  . DIABETES MELLITUS, TYPE I 12/29/2006  . CROHN'S DISEASE 04/14/2008  . 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 ANXIETY 12/29/2006  . Impotence   . Psoriasis     Past Surgical History  Procedure Date    . Total thyroidectomy 1997  . Tonsillectomy   . Cardiac catheterization 03/17/2003  . Stress cardiolite 02/17/2003  . Venous doppler 05/30/2004  . Electrocardiogram 03/27/2006  . Hernia repair 2009    ventral hernia    History   Social History  . Marital Status: Married    Spouse Name: N/A    Number of Children: N/A  . Years of Education: N/A   Occupational History  .  Retired    Social History Main Topics  . Smoking status: Former Games developer  . Smokeless tobacco: Not on file  . Alcohol Use: Not on file  . Drug Use: Not on file  . Sexually Active: Not on file   Other Topics Concern  . Not on file   Social History Narrative  . No narrative on file    Current Outpatient Prescriptions on File Prior to Visit  Medication Sig Dispense Refill  . atenolol (TENORMIN) 25 MG tablet take 1/2 tablet by mouth once daily  45 tablet  1  . busPIRone (BUSPAR) 15 MG tablet Take 15 mg by mouth 2 (two) times daily.        . Glucose Blood (ASCENSIA CONTOUR TEST VI) Use as directed to test two times a day       . HUMALOG KWIKPEN 100 UNIT/ML injection INJECT BEFORE MEALS 3 TIMES DAILY AS DIRECTED 15-20-35 UNITS  30 mL  2  . insulin NPH (HUMULIN N,NOVOLIN N) 100 UNIT/ML injection Inject 20 Units into the skin at bedtime.        . Insulin Pen Needle (B-D ULTRAFINE III SHORT PEN) 31G X 8 MM MISC Use as directed four times a day       . levothyroxine (SYNTHROID, LEVOTHROID) 200 MCG tablet take 1 tablet by mouth once daily  90 tablet  1  . lisinopril-hydrochlorothiazide (PRINZIDE,ZESTORETIC) 20-12.5 MG per tablet take 1 tablet by mouth once daily  90 tablet  0  . mesalamine (ASACOL) 400 MG EC tablet Take 4 tablets by mouth two times a day       . tadalafil (CIALIS) 20 MG tablet Take 20 mg by mouth as needed.        . terbinafine (LAMISIL) 250 MG tablet Take 250 mg by mouth daily.          No Known Allergies  Family History  Problem Relation Age of Onset  . Cancer Neg Hx     No cancer in the  patient's immediate family, except of course for the patient himself, as noted    BP 116/58  Pulse 80  Temp(Src) 98.3 F (36.8 C) (Oral)  Ht 6' (1.829 m)  Wt 240 lb 12.8 oz (109.226 kg)  BMI 32.66 kg/m2  SpO2 98%   Review of Systems  Denies hearing loss, and visual loss Objective:   Vision:  Sees opthalmologist Hearing: grossly normal Body mass index:  See vs page Msk: pt easily and quickly performs "get-up-and-go" from a sitting position Cognitive Impairment Assessment: cognition, memory and judgment appear normal.  remembers 3/3 at 5 minutes.  excellent recall.  can easily read and write a sentence.  alert and oriented x 3   Assessment:   Medicare wellness utd on preventive parameters    Plan:   During the course of the visit the patient was educated and counseled about appropriate screening and preventive services including:        Fall prevention   Diabetes screening  Nutrition counseling   Vaccines / LABS Zostavax / Pnemonccoal Vaccine  today  PSA  Patient Instructions (the written plan) was given to the patient.

## 2011-04-29 NOTE — Patient Instructions (Addendum)
please consider these measures for your health:  minimize alcohol.  do not use tobacco products.  have a colonoscopy at least every 10 years from age 70.  keep firearms safely stored.  always use seat belts.  have working smoke alarms in your home.  see an eye doctor and dentist regularly.  never drive under the influence of alcohol or drugs (including prescription drugs).  those with fair skin should take precautions against the sun. please let me know what your wishes would be, if artificial life support measures should become necessary.  it is critically important to prevent falling down (keep floor areas well-lit, dry, and free of loose objects.  If you have a cane, walker, or wheelchair, you should use it, even for short trips around the house.  Also, try not to rush) Please come back for a follow-up appointment in 6 months. blood tests are being requested for you today.  please call [phone] to hear your test results.  You will be prompted to enter the 9-digit "MRN" number that appears at the top left of this page, followed by #.  Then you will hear the message. (update: i left message on phone-tree:  Ref back to urol.  Call if hypoglycemia.  We'll follow creat)

## 2011-04-30 LAB — PTH, INTACT AND CALCIUM
Calcium, Total (PTH): 9.3 mg/dL (ref 8.4–10.5)
PTH: 40 pg/mL (ref 14.0–72.0)

## 2011-04-30 LAB — THYROGLOBULIN LEVEL
Thyroglobulin Ab: <20 U/mL (ref <40.0)
Thyroglobulin: <0.2 ng/mL (ref 0.0–55.0)

## 2011-05-05 ENCOUNTER — Other Ambulatory Visit: Payer: Self-pay | Admitting: Endocrinology

## 2011-05-20 ENCOUNTER — Other Ambulatory Visit: Payer: Self-pay | Admitting: Endocrinology

## 2011-06-01 ENCOUNTER — Other Ambulatory Visit: Payer: Self-pay | Admitting: Endocrinology

## 2011-06-06 ENCOUNTER — Other Ambulatory Visit: Payer: Self-pay | Admitting: Endocrinology

## 2011-06-26 ENCOUNTER — Other Ambulatory Visit (HOSPITAL_COMMUNITY): Payer: Self-pay | Admitting: Urology

## 2011-06-26 DIAGNOSIS — C61 Malignant neoplasm of prostate: Secondary | ICD-10-CM

## 2011-07-04 ENCOUNTER — Encounter (HOSPITAL_COMMUNITY)
Admission: RE | Admit: 2011-07-04 | Discharge: 2011-07-04 | Disposition: A | Discharge status: Home / Self Care | Payer: Medicare Other | Source: Ambulatory Visit | Attending: Urology | Admitting: Urology

## 2011-07-04 DIAGNOSIS — Z8546 Personal history of malignant neoplasm of prostate: Secondary | ICD-10-CM | POA: Diagnosis not present

## 2011-07-04 DIAGNOSIS — C61 Malignant neoplasm of prostate: Secondary | ICD-10-CM | POA: Diagnosis not present

## 2011-07-04 MED ORDER — TECHNETIUM TC 99M MEDRONATE IV KIT
25.0000 | PACK | Freq: Once | INTRAVENOUS | Status: AC | PRN
  Administered 2011-07-04: 25 via INTRAVENOUS

## 2011-07-08 ENCOUNTER — Other Ambulatory Visit: Payer: Self-pay | Admitting: Endocrinology

## 2011-07-22 DIAGNOSIS — K5080 Crohn's disease of both small and large intestine without complications: Secondary | ICD-10-CM | POA: Diagnosis not present

## 2011-07-23 DIAGNOSIS — K5080 Crohn's disease of both small and large intestine without complications: Secondary | ICD-10-CM | POA: Diagnosis not present

## 2011-08-02 ENCOUNTER — Other Ambulatory Visit: Payer: Self-pay

## 2011-08-02 MED ORDER — LISINOPRIL-HYDROCHLOROTHIAZIDE 20-12.5 MG PO TABS: 1.0000 | ORAL_TABLET | Freq: Every day | ORAL | Status: DC

## 2011-08-02 MED ORDER — MESALAMINE 400 MG PO TBEC: 1600.0000 mg | DELAYED_RELEASE_TABLET | Freq: Two times a day (BID) | ORAL | Status: DC

## 2011-08-02 MED ORDER — TADALAFIL 20 MG PO TABS: 20.0000 mg | ORAL_TABLET | ORAL | Status: DC | PRN

## 2011-08-02 MED ORDER — INSULIN NPH (HUMAN) (ISOPHANE) 100 UNIT/ML ~~LOC~~ SUSP: 20.0000 [IU] | Freq: Every day | SUBCUTANEOUS | Status: DC

## 2011-08-02 MED ORDER — LEVOTHYROXINE SODIUM 200 MCG PO TABS: 200.0000 ug | ORAL_TABLET | Freq: Every day | ORAL | Status: DC

## 2011-08-02 MED ORDER — INSULIN LISPRO 100 UNIT/ML ~~LOC~~ SOLN: SUBCUTANEOUS | Status: DC

## 2011-08-02 MED ORDER — INSULIN PEN NEEDLE 31G X 8 MM MISC: 1.0000 | Freq: Four times a day (QID) | Status: DC

## 2011-08-02 MED ORDER — ATENOLOL 25 MG PO TABS: 12.5000 mg | ORAL_TABLET | Freq: Every day | ORAL | Status: DC

## 2011-08-02 MED ORDER — BUSPIRONE HCL 15 MG PO TABS: 15.0000 mg | ORAL_TABLET | Freq: Two times a day (BID) | ORAL | Status: DC

## 2011-08-16 ENCOUNTER — Other Ambulatory Visit: Payer: Self-pay | Admitting: Endocrinology

## 2011-09-18 DIAGNOSIS — C61 Malignant neoplasm of prostate: Secondary | ICD-10-CM | POA: Diagnosis not present

## 2011-09-25 DIAGNOSIS — C61 Malignant neoplasm of prostate: Secondary | ICD-10-CM | POA: Diagnosis not present

## 2011-10-29 ENCOUNTER — Other Ambulatory Visit: Payer: Self-pay | Admitting: Endocrinology

## 2011-11-17 ENCOUNTER — Other Ambulatory Visit: Payer: Self-pay | Admitting: Endocrinology

## 2011-11-19 DIAGNOSIS — C44319 Basal cell carcinoma of skin of other parts of face: Secondary | ICD-10-CM | POA: Diagnosis not present

## 2011-11-19 DIAGNOSIS — L570 Actinic keratosis: Secondary | ICD-10-CM | POA: Diagnosis not present

## 2011-11-19 DIAGNOSIS — L578 Other skin changes due to chronic exposure to nonionizing radiation: Secondary | ICD-10-CM | POA: Diagnosis not present

## 2011-11-19 DIAGNOSIS — L821 Other seborrheic keratosis: Secondary | ICD-10-CM | POA: Diagnosis not present

## 2011-11-19 DIAGNOSIS — L820 Inflamed seborrheic keratosis: Secondary | ICD-10-CM | POA: Diagnosis not present

## 2011-11-28 DIAGNOSIS — C61 Malignant neoplasm of prostate: Secondary | ICD-10-CM | POA: Diagnosis not present

## 2012-01-30 DIAGNOSIS — C61 Malignant neoplasm of prostate: Secondary | ICD-10-CM | POA: Diagnosis not present

## 2012-02-07 DIAGNOSIS — C61 Malignant neoplasm of prostate: Secondary | ICD-10-CM | POA: Diagnosis not present

## 2012-02-07 DIAGNOSIS — E291 Testicular hypofunction: Secondary | ICD-10-CM | POA: Diagnosis not present

## 2012-03-10 ENCOUNTER — Other Ambulatory Visit: Payer: Self-pay | Admitting: Endocrinology

## 2012-04-24 ENCOUNTER — Other Ambulatory Visit: Payer: Self-pay | Admitting: Endocrinology

## 2012-04-24 MED ORDER — INSULIN LISPRO 100 UNIT/ML ~~LOC~~ SOLN: SUBCUTANEOUS | Status: DC

## 2012-04-29 ENCOUNTER — Ambulatory Visit (INDEPENDENT_AMBULATORY_CARE_PROVIDER_SITE_OTHER): Payer: Medicare Other | Admitting: Endocrinology

## 2012-04-29 VITALS — BP 134/76 | HR 97 | Temp 98.8°F | Wt 247.0 lb

## 2012-04-29 DIAGNOSIS — M109 Gout, unspecified: Secondary | ICD-10-CM

## 2012-04-29 DIAGNOSIS — Z119 Encounter for screening for infectious and parasitic diseases, unspecified: Secondary | ICD-10-CM | POA: Insufficient documentation

## 2012-04-29 DIAGNOSIS — E109 Type 1 diabetes mellitus without complications: Secondary | ICD-10-CM

## 2012-04-29 DIAGNOSIS — Z0000 Encounter for general adult medical examination without abnormal findings: Secondary | ICD-10-CM | POA: Diagnosis not present

## 2012-04-29 DIAGNOSIS — C73 Malignant neoplasm of thyroid gland: Secondary | ICD-10-CM | POA: Diagnosis not present

## 2012-04-29 DIAGNOSIS — I10 Essential (primary) hypertension: Secondary | ICD-10-CM

## 2012-04-29 DIAGNOSIS — E890 Postprocedural hypothyroidism: Secondary | ICD-10-CM

## 2012-04-29 DIAGNOSIS — Z79899 Other long term (current) drug therapy: Secondary | ICD-10-CM

## 2012-04-29 LAB — CBC WITH DIFFERENTIAL/PLATELET
Basophils Absolute: 0 10*3/uL (ref 0.0–0.1)
Basophils Relative: 0 % (ref 0–1)
Eosinophils Absolute: 0.2 10*3/uL (ref 0.0–0.7)
Eosinophils Relative: 2 % (ref 0–5)
HCT: 40.4 % (ref 39.0–52.0)
Hemoglobin: 14 g/dL (ref 13.0–17.0)
Lymphocytes Relative: 20 % (ref 12–46)
Lymphs Abs: 2.1 10*3/uL (ref 0.7–4.0)
MCH: 31.3 pg (ref 26.0–34.0)
MCHC: 34.7 g/dL (ref 30.0–36.0)
MCV: 90.4 fL (ref 78.0–100.0)
Monocytes Absolute: 0.6 10*3/uL (ref 0.1–1.0)
Monocytes Relative: 6 % (ref 3–12)
Neutro Abs: 7.2 10*3/uL (ref 1.7–7.7)
Neutrophils Relative %: 72 % (ref 43–77)
Platelets: 270 10*3/uL (ref 150–400)
RBC: 4.47 MIL/uL (ref 4.22–5.81)
RDW: 13.1 % (ref 11.5–15.5)
WBC: 10.1 10*3/uL (ref 4.0–10.5)

## 2012-04-29 LAB — LIPID PANEL
Cholesterol: 154 mg/dL (ref 0–200)
HDL: 38 mg/dL — ABNORMAL LOW (ref >39)
Total CHOL/HDL Ratio: 4.1 Ratio
Triglycerides: 480 mg/dL — ABNORMAL HIGH (ref <150)

## 2012-04-29 LAB — HEPATIC FUNCTION PANEL
ALT: 36 U/L (ref 0–53)
AST: 35 U/L (ref 0–37)
Albumin: 4.4 g/dL (ref 3.5–5.2)
Alkaline Phosphatase: 84 U/L (ref 39–117)
Bilirubin, Direct: 0.1 mg/dL (ref 0.0–0.3)
Indirect Bilirubin: 0.4 mg/dL (ref 0.0–0.9)
Total Bilirubin: 0.5 mg/dL (ref 0.3–1.2)
Total Protein: 6.6 g/dL (ref 6.0–8.3)

## 2012-04-29 LAB — BASIC METABOLIC PANEL
BUN: 18 mg/dL (ref 6–23)
CO2: 27 mEq/L (ref 19–32)
Calcium: 9.7 mg/dL (ref 8.4–10.5)
Chloride: 106 mEq/L (ref 96–112)
Creat: 1.57 mg/dL — ABNORMAL HIGH (ref 0.50–1.35)
Glucose, Bld: 91 mg/dL (ref 70–99)
Potassium: 4.3 mEq/L (ref 3.5–5.3)
Sodium: 140 mEq/L (ref 135–145)

## 2012-04-29 LAB — HEPATITIS B SURFACE ANTIGEN: Hepatitis B Surface Ag: NEGATIVE

## 2012-04-29 LAB — HEMOGLOBIN A1C
Hgb A1c MFr Bld: 6.3 % — ABNORMAL HIGH (ref <5.7)
Mean Plasma Glucose: 134 mg/dL — ABNORMAL HIGH (ref <117)

## 2012-04-29 LAB — HEPATITIS B SURFACE ANTIBODY,QUALITATIVE: Hep B S Ab: NEGATIVE

## 2012-04-29 LAB — URIC ACID: Uric Acid, Serum: 6.7 mg/dL (ref 4.0–7.8)

## 2012-04-29 LAB — TSH: TSH: 1.149 u[IU]/mL (ref 0.350–4.500)

## 2012-04-29 MED ORDER — INSULIN LISPRO 100 UNIT/ML ~~LOC~~ SOLN: SUBCUTANEOUS | Status: DC

## 2012-04-29 NOTE — Patient Instructions (Addendum)
blood tests are being requested for you today.  We'll contact you with results. please consider these measures for your health:  minimize alcohol.  do not use tobacco products.  have a colonoscopy at least every 10 years from age 71.  keep firearms safely stored.  always use seat belts.  have working smoke alarms in your home.  see an eye doctor and dentist regularly.  never drive under the influence of alcohol or drugs (including prescription drugs).  those with fair skin should take precautions against the sun.   good diet and exercise habits significanly improve the control of your diabetes.  please let me know if you wish to be referred to a dietician.  high blood sugar is very risky to your health.  you should see an eye doctor every year.  You are at higher than average risk for pneumonia and hepatitis-B.  You should be vaccinated against both.   please let me know what your wishes would be, if artificial life support measures should become necessary.  it is critically important to prevent falling down (keep floor areas well-lit, dry, and free of loose objects.  If you have a cane, walker, or wheelchair, you should use it, even for short trips around the house.  Also, try not to rush). Please come back for a follow-up appointment in 4 months (update: we discussed code status.  pt requests full code, but would not want to be started or maintained on artificial life-support measures if there was not a reasonable chance of recovery).

## 2012-04-29 NOTE — Progress Notes (Signed)
Subjective:    Patient ID: Devin [name], male    DOB: [birth year], 71 y.o.   MRN: [id number]  HPI The state of at least three ongoing medical problems is addressed today, with interval history of each noted here: Pt returns for f/u of insulin-requiring DM (dx'ed 1993; complicated by peripheral sensory neuropathy).  no cbg record, but states cbg's are well-controlled.  denies hypoglycemia.   Thyroid cancer: he denies any lump in the neck.   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  Past Medical History  Diagnosis Date  . VENTRAL HERNIA 04/14/2008  . PROSTATE CANCER, HX OF 12/29/2006  . OSTEOPOROSIS 12/29/2006  . 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 Hypocalcemia 04/13/2007  . HYPERTENSION 12/29/2006  . GOUT 12/29/2006  . DIABETES MELLITUS, TYPE I 12/29/2006  . CROHN'S DISEASE 04/14/2008  . 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 ANXIETY 12/29/2006  . Impotence   . Psoriasis     Past Surgical History  Procedure Date  . Total thyroidectomy 1997  . Tonsillectomy   . Cardiac catheterization 03/17/2003  . Stress cardiolite 02/17/2003  . Venous doppler 05/30/2004  . Electrocardiogram 03/27/2006  . Hernia repair 2009    ventral hernia    History   Social History  . Marital Status: Married    Spouse Name: N/A    Number of Children: N/A  . Years of Education: N/A   Occupational History  . Retired    Social History Main Topics  . Smoking status: Former Games developer  . Smokeless tobacco: Not on file  . Alcohol Use: Not on file  . Drug Use: Not on file  . Sexually Active: Not on file   Other Topics Concern  . Not on file   Social History Narrative  .  No narrative on file    Current Outpatient Prescriptions on File Prior to Visit  Medication Sig Dispense Refill  . atenolol (TENORMIN) 25 MG tablet Take 0.5 tablets (12.5 mg total) by mouth daily.  45 tablet  1  . atenolol (TENORMIN) 25 MG tablet take 1/2 tablet by mouth once daily  45 tablet  1  . BAYER CONTOUR TEST test strip TEST twice a day  200 each  2  . busPIRone (BUSPAR) 15 MG tablet Take 1 tablet (15 mg total) by mouth 2 (two) times daily.  180 tablet  1  . Glucose Blood (ASCENSIA CONTOUR TEST VI) Use as directed to test two times a day       . HUMALOG KWIKPEN 100 UNIT/ML injection INJECT SUBCUTANEOUSLY BEFORE MEALS 3 TIMES A DAY 15-20-35 UNITS AS DIRECTED  3 mL  3  . insulin NPH (HUMULIN N PEN) 100 UNIT/ML injection Inject 20 Units into the skin at bedtime.  20 mL  1  . Insulin Pen Needle (B-D ULTRAFINE III SHORT PEN) 31G X 8 MM MISC 1 each by Other route 4 (four) times daily.  400 each  1  . levothyroxine (SYNTHROID, LEVOTHROID) 200 MCG tablet Take 1 tablet (200 mcg total) by mouth daily.  90 tablet  1  . lisinopril-hydrochlorothiazide (PRINZIDE,ZESTORETIC) 20-12.5 MG per tablet Take 1 tablet by mouth daily.  90 tablet  1  .  mesalamine (ASACOL) 400 MG EC tablet Take 4 tablets (1,600 mg total) by mouth 2 (two) times daily. Take 4 tablets by mouth two times a day  720 tablet  1  . tadalafil (CIALIS) 20 MG tablet Take 1 tablet (20 mg total) by mouth as needed.  30 tablet  1  . terbinafine (LAMISIL) 250 MG tablet Take 250 mg by mouth daily.          No Known Allergies  Family History  Problem Relation Age of Onset  . Cancer Neg Hx     No cancer in the patient's immediate family, except of course for the patient himself, as noted    BP 134/76  Pulse 97  Temp 98.8 F (37.1 C) (Oral)  Wt 247 lb (112.038 kg)  SpO2 96%  Review of Systems Denies brbpr and sob    Objective:   Physical Exam VS: see vs page GEN: no distress HEAD: head: no deformity eyes: no periorbital  swelling, no proptosis external nose and ears are normal NECK: a healed scar is present.  i do not appreciate a nodule in the thyroid or elsewhere in the neck CHEST WALL: no deformity LUNGS: clear to auscultation BREASTS:  No gynecomastia CV: reg rate and rhythm, no murmur GENITALIA/RECTAL/PROSTATE:  sees urology MUSCULOSKELETAL: muscle bulk and strength are grossly normal.  no obvious joint swelling.  gait is normal and steady EXTEMITIES: no deformity.  no ulcer on the feet.  feet are of normal color and temp.  no edema PULSES: dorsalis pedis intact bilat.  no carotid bruit NEURO:  cn 2-12 grossly intact.   readily moves all 4's.  sensation is intact to touch on the feet SKIN:  Normal texture and temperature.  No rash or suspicious lesion is visible.   NODES:  None palpable at the neck PSYCH: alert, oriented x3.  Does not appear anxious nor depressed. Lab Results  Component Value Date   WBC 10.1 04/29/2012   HGB 14.0 04/29/2012   HCT 40.4 04/29/2012   PLT 270 04/29/2012   GLUCOSE 91 04/29/2012   CHOL 154 04/29/2012   TRIG 480* 04/29/2012   HDL 38* 04/29/2012   LDLDIRECT 61.6 04/13/2007   LDLCALC Comment:   Not calculated due to Triglyceride >400. Suggest ordering Direct LDL (Unit Code: 16109).   Total Cholesterol/HDL Ratio:CHD Risk                        Coronary Heart Disease Risk Table                                        Men       Women          1/2 Average Risk              3.4        3.3              Average Risk              5.0        4.4           2X Average Risk              9.6        7.1           3X Average Risk  23.4       11.0 Use the calculated Patient Ratio above and the CHD Risk table  to determine the patient's CHD Risk. ATP III Classification (LDL):       < 100        mg/dL         Optimal      960 - 129     mg/dL         Near or Above Optimal      130 - 159     mg/dL         Borderline High      160 - 189     mg/dL         High       > 454        mg/dL          Very High   09/81/1914   ALT 36 04/29/2012   AST 35 04/29/2012   NA 140 04/29/2012   K 4.3 04/29/2012   CL 106 04/29/2012   CREATININE 1.57* 04/29/2012   BUN 18 04/29/2012   CO2 27 04/29/2012   TSH 1.149 04/29/2012   PSA 4.27* 04/29/2011   HGBA1C 6.3* 04/29/2012   MICROALBUR <0.50 04/29/2012      Assessment & Plan:  Thyroid cancer; no evidence of recurrence. DM, well-controlled Dyslipidemia. Although this is not fasting, he still needs increased rx Renal insuff,stable    Subjective:   Patient here for Medicare annual wellness visit and management of other chronic and acute problems.     Risk factors: advanced age    Roster of Physicians Providing Medical Care to Patient:  See "snapshot"   Activities of Daily Living: In your present state of health, do you have any difficulty performing the following activities?:  Preparing food and eating?: No  Bathing yourself: No  Getting dressed: No  Using the toilet:No  Moving around from place to place: No  In the past year have you fallen or had a near fall?: No    Home Safety: Has smoke detector and wears seat belts. No firearms. No excess sun exposure.  Diet and Exercise  Current exercise habits: pt says good. Dietary issues discussed: pt reports a healthy diet   Depression Screen  Q1: Over the past two weeks, have you felt down, depressed or hopeless? no  Q2: Over the past two weeks, have you felt little interest or pleasure in doing things? no   The following portions of the patient's history were reviewed and updated as appropriate: allergies, current medications, past family history, past medical history, past social history, past surgical history and problem list.  Past Medical History  Diagnosis Date  . VENTRAL HERNIA 04/14/2008  . PROSTATE CANCER, HX OF 12/29/2006  . OSTEOPOROSIS 12/29/2006  . 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 Hypocalcemia 04/13/2007  . HYPERTENSION 12/29/2006  . GOUT 12/29/2006  .  DIABETES MELLITUS, TYPE I 12/29/2006  . CROHN'S DISEASE 04/14/2008  . 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 ANXIETY 12/29/2006  . Impotence   . Psoriasis     Past Surgical History  Procedure Date  . Total thyroidectomy 1997  . Tonsillectomy   . Cardiac catheterization 03/17/2003  . Stress cardiolite 02/17/2003  . Venous doppler 05/30/2004  . Electrocardiogram 03/27/2006  . Hernia repair 2009  ventral hernia    History   Social History  . Marital Status: Married    Spouse Name: N/A    Number of Children: N/A  . Years of Education: N/A   Occupational History  . Retired    Social History Main Topics  . Smoking status: Former Games developer  . Smokeless tobacco: Not on file  . Alcohol Use: Not on file  . Drug Use: Not on file  . Sexually Active: Not on file   Other Topics Concern  . Not on file   Social History Narrative  . No narrative on file    Current Outpatient Prescriptions on File Prior to Visit  Medication Sig Dispense Refill  . atenolol (TENORMIN) 25 MG tablet Take 0.5 tablets (12.5 mg total) by mouth daily.  45 tablet  1  . atenolol (TENORMIN) 25 MG tablet take 1/2 tablet by mouth once daily  45 tablet  1  . BAYER CONTOUR TEST test strip TEST twice a day  200 each  2  . busPIRone (BUSPAR) 15 MG tablet Take 1 tablet (15 mg total) by mouth 2 (two) times daily.  180 tablet  1  . Glucose Blood (ASCENSIA CONTOUR TEST VI) Use as directed to test two times a day       . HUMALOG KWIKPEN 100 UNIT/ML injection INJECT SUBCUTANEOUSLY BEFORE MEALS 3 TIMES A DAY 15-20-35 UNITS AS DIRECTED  3 mL  3  . insulin lispro (HUMALOG KWIKPEN) 100 UNIT/ML injection Inject subcutaneously before meals 3 times a day 15-20-35 units as directed  70 mL  1  . insulin NPH (HUMULIN N PEN) 100 UNIT/ML injection Inject 20 Units into the skin at  bedtime.  20 mL  1  . Insulin Pen Needle (B-D ULTRAFINE III SHORT PEN) 31G X 8 MM MISC 1 each by Other route 4 (four) times daily.  400 each  1  . levothyroxine (SYNTHROID, LEVOTHROID) 200 MCG tablet Take 1 tablet (200 mcg total) by mouth daily.  90 tablet  1  . lisinopril-hydrochlorothiazide (PRINZIDE,ZESTORETIC) 20-12.5 MG per tablet Take 1 tablet by mouth daily.  90 tablet  1  . mesalamine (ASACOL) 400 MG EC tablet Take 4 tablets (1,600 mg total) by mouth 2 (two) times daily. Take 4 tablets by mouth two times a day  720 tablet  1  . tadalafil (CIALIS) 20 MG tablet Take 1 tablet (20 mg total) by mouth as needed.  30 tablet  1  . terbinafine (LAMISIL) 250 MG tablet Take 250 mg by mouth daily.          No Known Allergies  Family History  Problem Relation Age of Onset  . Cancer Neg Hx     No cancer in the patient's immediate family, except of course for the patient himself, as noted    BP 134/76  Pulse 97  Temp 98.8 F (37.1 C) (Oral)  Wt 247 lb (112.038 kg)  SpO2 96%   Review of Systems  Denies hearing loss, and visual loss Objective:   Vision:  Sees opthalmologist Hearing: grossly normal Body mass index:  See vs page Msk: pt easily and quickly performs "get-up-and-go" from a sitting position Cognitive Impairment Assessment: cognition, memory and judgment appear normal.  remembers 3/3 at 5 minutes.  excellent recall.  can easily read and write a sentence.  alert and oriented x 3.     Assessment:   Medicare wellness utd on preventive parameters  Plan:   During the course of the visit the  patient was educated and counseled about appropriate screening and preventive services including:        Fall prevention    Diabetes screening  Nutrition counseling   Vaccines / LABS Zostavax / Pnemonccoal Vaccine  today   Patient Instructions (the written plan) was given to the patient.

## 2012-04-30 ENCOUNTER — Telehealth: Payer: Self-pay | Admitting: *Deleted

## 2012-04-30 LAB — URINALYSIS, ROUTINE W REFLEX MICROSCOPIC
Bilirubin Urine: NEGATIVE
Glucose, UA: NEGATIVE mg/dL
Hgb urine dipstick: NEGATIVE
Ketones, ur: NEGATIVE mg/dL
Leukocytes, UA: NEGATIVE
Nitrite: NEGATIVE
Protein, ur: NEGATIVE mg/dL
Specific Gravity, Urine: 1.013 (ref 1.005–1.030)
Urobilinogen, UA: 0.2 mg/dL (ref 0.0–1.0)
pH: 5 (ref 5.0–8.0)

## 2012-04-30 LAB — MICROALBUMIN / CREATININE URINE RATIO
Creatinine, Urine: 69 mg/dL
Microalb, Ur: <0.5 mg/dL (ref 0.00–1.89)

## 2012-04-30 LAB — THYROGLOBULIN LEVEL: Thyroglobulin: <0.2 ng/mL (ref 0.0–55.0)

## 2012-04-30 LAB — THYROGLOBULIN ANTIBODY: Thyroglobulin Ab: <20 U/mL (ref <40.0)

## 2012-04-30 NOTE — Telephone Encounter (Signed)
Message copied by Devin [name] on Thu Apr 30, 2012  4:37 PM ------      Message from: Devin [name]      Created: Thu Apr 30, 2012  3:34 PM       please call patient:      All results are good except triglycerides (blood fats) are high.  This could be partially caused by the fact that the blood test was not fasting.  However, you should still consider taking a medication for this.

## 2012-04-30 NOTE — Telephone Encounter (Signed)
PATIENT NOTIFIED OF LAB RESULTS FOR TRIGLYCERIDE. PATIENT STATES WOULD LIKE TO TRY DIET AND EXERSIZE AND FASTING BEFORE NEXT LAB STUDIES ARE DONE BEFORE STARTING MEDICATION.

## 2012-05-01 NOTE — Telephone Encounter (Signed)
ok 

## 2012-05-04 LAB — PTH, INTACT AND CALCIUM
Calcium, Total (PTH): 9.7 mg/dL (ref 8.4–10.5)
PTH: 45.2 pg/mL (ref 14.0–72.0)

## 2012-05-22 DIAGNOSIS — L819 Disorder of pigmentation, unspecified: Secondary | ICD-10-CM | POA: Diagnosis not present

## 2012-05-22 DIAGNOSIS — D485 Neoplasm of uncertain behavior of skin: Secondary | ICD-10-CM | POA: Diagnosis not present

## 2012-05-22 DIAGNOSIS — L821 Other seborrheic keratosis: Secondary | ICD-10-CM | POA: Diagnosis not present

## 2012-05-22 DIAGNOSIS — C44320 Squamous cell carcinoma of skin of unspecified parts of face: Secondary | ICD-10-CM | POA: Diagnosis not present

## 2012-05-22 DIAGNOSIS — L578 Other skin changes due to chronic exposure to nonionizing radiation: Secondary | ICD-10-CM | POA: Diagnosis not present

## 2012-05-22 DIAGNOSIS — L570 Actinic keratosis: Secondary | ICD-10-CM | POA: Diagnosis not present

## 2012-05-22 DIAGNOSIS — C44319 Basal cell carcinoma of skin of other parts of face: Secondary | ICD-10-CM | POA: Diagnosis not present

## 2012-06-04 ENCOUNTER — Other Ambulatory Visit: Payer: Self-pay | Admitting: Endocrinology

## 2012-06-04 DIAGNOSIS — C61 Malignant neoplasm of prostate: Secondary | ICD-10-CM | POA: Diagnosis not present

## 2012-06-06 ENCOUNTER — Other Ambulatory Visit: Payer: Self-pay | Admitting: Endocrinology

## 2012-06-07 ENCOUNTER — Other Ambulatory Visit: Payer: Self-pay | Admitting: Endocrinology

## 2012-06-08 MED ORDER — LISINOPRIL-HYDROCHLOROTHIAZIDE 20-12.5 MG PO TABS: 1.0000 | ORAL_TABLET | Freq: Every day | ORAL | Status: DC

## 2012-06-08 MED ORDER — BUSPIRONE HCL 15 MG PO TABS: 15.0000 mg | ORAL_TABLET | Freq: Two times a day (BID) | ORAL | Status: DC

## 2012-06-11 DIAGNOSIS — Z79899 Other long term (current) drug therapy: Secondary | ICD-10-CM | POA: Diagnosis not present

## 2012-06-11 DIAGNOSIS — R232 Flushing: Secondary | ICD-10-CM | POA: Diagnosis not present

## 2012-06-11 DIAGNOSIS — E291 Testicular hypofunction: Secondary | ICD-10-CM | POA: Diagnosis not present

## 2012-06-11 DIAGNOSIS — C61 Malignant neoplasm of prostate: Secondary | ICD-10-CM | POA: Diagnosis not present

## 2012-06-29 DIAGNOSIS — C44320 Squamous cell carcinoma of skin of unspecified parts of face: Secondary | ICD-10-CM | POA: Diagnosis not present

## 2012-07-06 DIAGNOSIS — C44319 Basal cell carcinoma of skin of other parts of face: Secondary | ICD-10-CM | POA: Diagnosis not present

## 2012-07-10 ENCOUNTER — Other Ambulatory Visit: Payer: Self-pay | Admitting: Endocrinology

## 2012-07-20 DIAGNOSIS — K5080 Crohn's disease of both small and large intestine without complications: Secondary | ICD-10-CM | POA: Diagnosis not present

## 2012-09-02 ENCOUNTER — Other Ambulatory Visit: Payer: Self-pay | Admitting: Endocrinology

## 2012-09-02 ENCOUNTER — Other Ambulatory Visit: Payer: Self-pay | Admitting: *Deleted

## 2012-09-02 MED ORDER — ATENOLOL 25 MG PO TABS: ORAL_TABLET | ORAL | Status: DC

## 2012-09-12 ENCOUNTER — Other Ambulatory Visit: Payer: Self-pay | Admitting: Endocrinology

## 2012-09-14 ENCOUNTER — Other Ambulatory Visit: Payer: Self-pay | Admitting: *Deleted

## 2012-09-14 MED ORDER — INSULIN PEN NEEDLE 31G X 8 MM MISC: 1.0000 | Freq: Four times a day (QID) | Status: DC

## 2012-10-08 DIAGNOSIS — C61 Malignant neoplasm of prostate: Secondary | ICD-10-CM | POA: Diagnosis not present

## 2012-10-14 DIAGNOSIS — Z79899 Other long term (current) drug therapy: Secondary | ICD-10-CM | POA: Diagnosis not present

## 2012-10-14 DIAGNOSIS — E291 Testicular hypofunction: Secondary | ICD-10-CM | POA: Diagnosis not present

## 2012-10-14 DIAGNOSIS — C61 Malignant neoplasm of prostate: Secondary | ICD-10-CM | POA: Diagnosis not present

## 2012-11-06 ENCOUNTER — Other Ambulatory Visit: Payer: Self-pay | Admitting: Endocrinology

## 2012-11-09 ENCOUNTER — Other Ambulatory Visit: Payer: Self-pay | Admitting: Endocrinology

## 2012-11-10 ENCOUNTER — Other Ambulatory Visit: Payer: Self-pay | Admitting: *Deleted

## 2012-11-10 MED ORDER — LEVOTHYROXINE SODIUM 200 MCG PO TABS: 200.0000 ug | ORAL_TABLET | Freq: Every day | ORAL | Status: DC

## 2012-11-24 DIAGNOSIS — D1801 Hemangioma of skin and subcutaneous tissue: Secondary | ICD-10-CM | POA: Diagnosis not present

## 2012-11-24 DIAGNOSIS — L819 Disorder of pigmentation, unspecified: Secondary | ICD-10-CM | POA: Diagnosis not present

## 2012-11-24 DIAGNOSIS — Z85828 Personal history of other malignant neoplasm of skin: Secondary | ICD-10-CM | POA: Diagnosis not present

## 2012-11-24 DIAGNOSIS — L570 Actinic keratosis: Secondary | ICD-10-CM | POA: Diagnosis not present

## 2012-11-24 DIAGNOSIS — L578 Other skin changes due to chronic exposure to nonionizing radiation: Secondary | ICD-10-CM | POA: Diagnosis not present

## 2012-11-24 DIAGNOSIS — L821 Other seborrheic keratosis: Secondary | ICD-10-CM | POA: Diagnosis not present

## 2012-11-25 ENCOUNTER — Other Ambulatory Visit: Payer: Self-pay | Admitting: Endocrinology

## 2012-11-26 ENCOUNTER — Other Ambulatory Visit: Payer: Self-pay | Admitting: *Deleted

## 2012-11-26 MED ORDER — GLUCOSE BLOOD VI STRP: ORAL_STRIP | Status: DC

## 2012-11-26 NOTE — Telephone Encounter (Signed)
Had to resend rx for test strips including dx code for medicare.

## 2012-11-30 ENCOUNTER — Other Ambulatory Visit: Payer: Self-pay | Admitting: Endocrinology

## 2012-11-30 ENCOUNTER — Other Ambulatory Visit: Payer: Self-pay | Admitting: *Deleted

## 2012-11-30 MED ORDER — BUSPIRONE HCL 15 MG PO TABS: 15.0000 mg | ORAL_TABLET | Freq: Two times a day (BID) | ORAL | Status: DC

## 2012-11-30 MED ORDER — LISINOPRIL-HYDROCHLOROTHIAZIDE 20-12.5 MG PO TABS: 1.0000 | ORAL_TABLET | Freq: Every day | ORAL | Status: DC

## 2012-12-31 ENCOUNTER — Ambulatory Visit (INDEPENDENT_AMBULATORY_CARE_PROVIDER_SITE_OTHER): Payer: Medicare Other | Admitting: Endocrinology

## 2012-12-31 ENCOUNTER — Encounter: Payer: Self-pay | Admitting: Endocrinology

## 2012-12-31 VITALS — BP 120/70 | HR 75 | Ht 70.0 in | Wt 235.0 lb

## 2012-12-31 DIAGNOSIS — E109 Type 1 diabetes mellitus without complications: Secondary | ICD-10-CM

## 2012-12-31 DIAGNOSIS — E11319 Type 2 diabetes mellitus with unspecified diabetic retinopathy without macular edema: Secondary | ICD-10-CM | POA: Diagnosis not present

## 2012-12-31 DIAGNOSIS — H40019 Open angle with borderline findings, low risk, unspecified eye: Secondary | ICD-10-CM | POA: Diagnosis not present

## 2012-12-31 DIAGNOSIS — H2510 Age-related nuclear cataract, unspecified eye: Secondary | ICD-10-CM | POA: Diagnosis not present

## 2012-12-31 DIAGNOSIS — E119 Type 2 diabetes mellitus without complications: Secondary | ICD-10-CM | POA: Diagnosis not present

## 2012-12-31 LAB — HEMOGLOBIN A1C: Hgb A1c MFr Bld: 6.8 % — ABNORMAL HIGH (ref 4.6–6.5)

## 2012-12-31 NOTE — Progress Notes (Signed)
Subjective:    Patient ID: Devin [name], male    DOB: 10/21/1940, 72 y.o.   MRN: [id number]  HPI Pt returns for f/u of insulin-requiring DM (dx'ed 1997; he has mild if any neuropathy of the lower extremities; no associated complications).  no cbg record, but states cbg's are well-controlled.  He seldom has hypoglycemia, and this is mild. Past Medical History  Diagnosis Date  . VENTRAL HERNIA 04/14/2008  . PROSTATE CANCER, HX OF 12/29/2006  . OSTEOPOROSIS 12/29/2006  . 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 Hypocalcemia 04/13/2007  . HYPERTENSION 12/29/2006  . GOUT 12/29/2006  . DIABETES MELLITUS, TYPE I 12/29/2006  . CROHN'S DISEASE 04/14/2008  . 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 ANXIETY 12/29/2006  . Impotence   . Psoriasis     Past Surgical History  Procedure Laterality Date  . Total thyroidectomy  1997  . Tonsillectomy    . Cardiac catheterization  03/17/2003  . Stress cardiolite  02/17/2003  . Venous doppler  05/30/2004  . Electrocardiogram  03/27/2006  . Hernia repair  2009    ventral hernia    History   Social History  . Marital Status: Married    Spouse Name: N/A    Number of Children: N/A  . Years of Education: N/A   Occupational History  . Retired    Social History Main Topics  . Smoking status: Former Games developer  . Smokeless tobacco: Not on file  . Alcohol Use: Not on file  . Drug Use: Not on file  . Sexually Active: Not on file   Other Topics Concern  . Not on file   Social History Narrative  . No narrative on file    Current Outpatient Prescriptions on File Prior to Visit  Medication Sig Dispense Refill  . atenolol (TENORMIN) 25 MG tablet TAKE 1/2 TABLET BY MOUTH ONCE DAILY.  45 tablet  1  . B-D ULTRAFINE III SHORT PEN 31G X 8 MM MISC use four times a day  400 each  3  . busPIRone  (BUSPAR) 15 MG tablet Take 1 tablet (15 mg total) by mouth 2 (two) times daily.  180 tablet  0  . Glucose Blood (ASCENSIA CONTOUR TEST VI) Use as directed to test two times a day       . glucose blood (BAYER CONTOUR TEST) test strip TEST TWICE DAILY AS DIRECTED. Dx code: 250.01  200 each  2  . insulin NPH (HUMULIN N PEN) 100 UNIT/ML injection Inject 20 Units into the skin at bedtime.  20 mL  1  . Insulin Pen Needle (B-D ULTRAFINE III SHORT PEN) 31G X 8 MM MISC 1 each by Other route 4 (four) times daily.  400 each  1  . leuprolide (LUPRON) 11.25 MG injection Inject 11.25 mg into the muscle every 3 (three) months.      . levothyroxine (SYNTHROID, LEVOTHROID) 200 MCG tablet Take 1 tablet (200 mcg total) by mouth daily before breakfast.  30 tablet  1  . lisinopril-hydrochlorothiazide (PRINZIDE,ZESTORETIC) 20-12.5 MG per tablet Take 1 tablet by mouth daily.  90 tablet  0  . mesalamine (ASACOL) 400 MG EC tablet Take 4 tablets (1,600 mg total) by mouth 2 (two) times daily. Take 4 tablets by mouth two times a day  720 tablet  1  . tadalafil (CIALIS) 20 MG tablet  Take 1 tablet (20 mg total) by mouth as needed.  30 tablet  1  . terbinafine (LAMISIL) 250 MG tablet Take 250 mg by mouth daily.         No current facility-administered medications on file prior to visit.   No Known Allergies  Family History  Problem Relation Age of Onset  . Cancer Neg Hx     No cancer in the patient's immediate family, except of course for the patient himself, as noted    BP 120/70  Pulse 75  Ht 5\' 10"  (1.778 m)  Wt 235 lb (106.595 kg)  BMI 33.72 kg/m2  SpO2 97%  Review of Systems Denies LOC and weight change    Objective:   Physical Exam VITAL SIGNS:  See vs page GENERAL: no distress  Lab Results  Component Value Date   HGBA1C 6.8* 12/31/2012       Assessment & Plan:  DM: well-controlled.  This insulin regimen was chosen from multiple options, as it best matches his insulin to his changing requirements  throughout the day.  The benefits of glycemic control must be weighed against the risks of hypoglycemia.

## 2012-12-31 NOTE — Patient Instructions (Addendum)
Please come back for a "medicare wellness" appointment in 3 months (after 04/29/13). blood tests are being requested for you today.  We'll contact you with results. check your blood sugar twice a day.  vary the time of day when you check, between before the 3 meals, and at bedtime.  also check if you have symptoms of your blood sugar being too high or too low.  please keep a record of the readings and bring it to your next appointment here.  please call us sooner if your blood sugar goes below 70, or if you have a lot of readings over 200. blood tests are being requested for you today.  We'll contact you with results.

## 2013-01-07 DIAGNOSIS — Z1211 Encounter for screening for malignant neoplasm of colon: Secondary | ICD-10-CM | POA: Diagnosis not present

## 2013-01-07 DIAGNOSIS — K5090 Crohn's disease, unspecified, without complications: Secondary | ICD-10-CM | POA: Diagnosis not present

## 2013-01-07 DIAGNOSIS — K5080 Crohn's disease of both small and large intestine without complications: Secondary | ICD-10-CM | POA: Diagnosis not present

## 2013-01-07 DIAGNOSIS — D126 Benign neoplasm of colon, unspecified: Secondary | ICD-10-CM | POA: Diagnosis not present

## 2013-01-07 DIAGNOSIS — K5730 Diverticulosis of large intestine without perforation or abscess without bleeding: Secondary | ICD-10-CM | POA: Diagnosis not present

## 2013-01-07 DIAGNOSIS — K5289 Other specified noninfective gastroenteritis and colitis: Secondary | ICD-10-CM | POA: Diagnosis not present

## 2013-01-07 DIAGNOSIS — Z980 Intestinal bypass and anastomosis status: Secondary | ICD-10-CM | POA: Diagnosis not present

## 2013-01-07 DIAGNOSIS — Z86010 Personal history of colonic polyps: Secondary | ICD-10-CM | POA: Diagnosis not present

## 2013-01-18 ENCOUNTER — Telehealth: Payer: Self-pay

## 2013-01-18 ENCOUNTER — Encounter: Payer: Self-pay | Admitting: Endocrinology

## 2013-01-18 NOTE — Telephone Encounter (Signed)
Spoke with Randa Evens in Medical records at [facility], in order to get pt's thyroid path report from 1997, they a request from you on letterhead faxed to [phone]

## 2013-01-18 NOTE — Telephone Encounter (Signed)
done

## 2013-01-19 NOTE — Telephone Encounter (Signed)
faxed

## 2013-02-01 DIAGNOSIS — C61 Malignant neoplasm of prostate: Secondary | ICD-10-CM | POA: Diagnosis not present

## 2013-02-15 ENCOUNTER — Other Ambulatory Visit: Payer: Self-pay | Admitting: *Deleted

## 2013-02-15 MED ORDER — LEVOTHYROXINE SODIUM 200 MCG PO TABS: 200.0000 ug | ORAL_TABLET | Freq: Every day | ORAL | Status: DC

## 2013-02-17 DIAGNOSIS — C61 Malignant neoplasm of prostate: Secondary | ICD-10-CM | POA: Diagnosis not present

## 2013-02-24 ENCOUNTER — Other Ambulatory Visit: Payer: Self-pay | Admitting: *Deleted

## 2013-02-24 MED ORDER — ATENOLOL 25 MG PO TABS: ORAL_TABLET | ORAL | Status: DC

## 2013-03-12 ENCOUNTER — Other Ambulatory Visit: Payer: Self-pay | Admitting: *Deleted

## 2013-03-12 MED ORDER — BUSPIRONE HCL 15 MG PO TABS: 15.0000 mg | ORAL_TABLET | Freq: Two times a day (BID) | ORAL | Status: DC

## 2013-03-12 MED ORDER — LISINOPRIL-HYDROCHLOROTHIAZIDE 20-12.5 MG PO TABS: 1.0000 | ORAL_TABLET | Freq: Every day | ORAL | Status: DC

## 2013-04-02 DIAGNOSIS — H40019 Open angle with borderline findings, low risk, unspecified eye: Secondary | ICD-10-CM | POA: Diagnosis not present

## 2013-04-02 DIAGNOSIS — H01009 Unspecified blepharitis unspecified eye, unspecified eyelid: Secondary | ICD-10-CM | POA: Diagnosis not present

## 2013-04-02 DIAGNOSIS — H2510 Age-related nuclear cataract, unspecified eye: Secondary | ICD-10-CM | POA: Diagnosis not present

## 2013-04-09 ENCOUNTER — Other Ambulatory Visit: Payer: Self-pay | Admitting: *Deleted

## 2013-04-09 MED ORDER — LEVOTHYROXINE SODIUM 200 MCG PO TABS: 200.0000 ug | ORAL_TABLET | Freq: Every day | ORAL | Status: DC

## 2013-05-03 ENCOUNTER — Encounter: Payer: Self-pay | Admitting: Endocrinology

## 2013-05-03 ENCOUNTER — Ambulatory Visit (INDEPENDENT_AMBULATORY_CARE_PROVIDER_SITE_OTHER): Payer: Medicare Other | Admitting: Endocrinology

## 2013-05-03 ENCOUNTER — Other Ambulatory Visit: Payer: Medicare Other

## 2013-05-03 VITALS — BP 138/68 | HR 88 | Temp 97.8°F | Resp 16 | Ht 71.0 in | Wt 237.4 lb

## 2013-05-03 DIAGNOSIS — E109 Type 1 diabetes mellitus without complications: Secondary | ICD-10-CM

## 2013-05-03 DIAGNOSIS — Z79899 Other long term (current) drug therapy: Secondary | ICD-10-CM

## 2013-05-03 DIAGNOSIS — C73 Malignant neoplasm of thyroid gland: Secondary | ICD-10-CM

## 2013-05-03 DIAGNOSIS — M810 Age-related osteoporosis without current pathological fracture: Secondary | ICD-10-CM | POA: Diagnosis not present

## 2013-05-03 DIAGNOSIS — I10 Essential (primary) hypertension: Secondary | ICD-10-CM

## 2013-05-03 DIAGNOSIS — E890 Postprocedural hypothyroidism: Secondary | ICD-10-CM

## 2013-05-03 DIAGNOSIS — M109 Gout, unspecified: Secondary | ICD-10-CM

## 2013-05-03 DIAGNOSIS — Z8546 Personal history of malignant neoplasm of prostate: Secondary | ICD-10-CM

## 2013-05-03 LAB — CBC WITH DIFFERENTIAL/PLATELET
Basophils Absolute: 0 10*3/uL (ref 0.0–0.1)
Basophils Relative: 0.3 % (ref 0.0–3.0)
Eosinophils Absolute: 0.2 10*3/uL (ref 0.0–0.7)
Eosinophils Relative: 2.6 % (ref 0.0–5.0)
HCT: 41.2 % (ref 39.0–52.0)
Hemoglobin: 13.8 g/dL (ref 13.0–17.0)
Lymphocytes Relative: 20.3 % (ref 12.0–46.0)
Lymphs Abs: 1.8 10*3/uL (ref 0.7–4.0)
MCHC: 33.5 g/dL (ref 30.0–36.0)
MCV: 92.8 fl (ref 78.0–100.0)
Monocytes Absolute: 0.5 10*3/uL (ref 0.1–1.0)
Monocytes Relative: 6.2 % (ref 3.0–12.0)
Neutro Abs: 6.1 10*3/uL (ref 1.4–7.7)
Neutrophils Relative %: 70.6 % (ref 43.0–77.0)
Platelets: 279 10*3/uL (ref 150.0–400.0)
RBC: 4.44 Mil/uL (ref 4.22–5.81)
RDW: 13.9 % (ref 11.5–14.6)
WBC: 8.6 10*3/uL (ref 4.5–10.5)

## 2013-05-03 LAB — BASIC METABOLIC PANEL
BUN: 19 mg/dL (ref 6–23)
CO2: 25 mEq/L (ref 19–32)
Calcium: 9.6 mg/dL (ref 8.4–10.5)
Chloride: 103 mEq/L (ref 96–112)
Creatinine, Ser: 1.5 mg/dL (ref 0.4–1.5)
GFR: 48.03 mL/min — ABNORMAL LOW (ref >60.00)
Glucose, Bld: 114 mg/dL — ABNORMAL HIGH (ref 70–99)
Potassium: 4.1 mEq/L (ref 3.5–5.1)
Sodium: 139 mEq/L (ref 135–145)

## 2013-05-03 LAB — URINALYSIS, ROUTINE W REFLEX MICROSCOPIC
Bilirubin Urine: NEGATIVE
Hgb urine dipstick: NEGATIVE
Ketones, ur: NEGATIVE
Leukocytes, UA: NEGATIVE
Nitrite: NEGATIVE
RBC / HPF: NONE SEEN (ref 0–?)
Specific Gravity, Urine: 1.015 (ref 1.000–1.030)
Total Protein, Urine: NEGATIVE
Urine Glucose: NEGATIVE
Urobilinogen, UA: 0.2 (ref 0.0–1.0)
WBC, UA: NONE SEEN (ref 0–?)
pH: 5.5 (ref 5.0–8.0)

## 2013-05-03 LAB — MICROALBUMIN / CREATININE URINE RATIO
Creatinine,U: 72.2 mg/dL
Microalb Creat Ratio: 0.3 mg/g (ref 0.0–30.0)
Microalb, Ur: 0.2 mg/dL (ref 0.0–1.9)

## 2013-05-03 LAB — LDL CHOLESTEROL, DIRECT: Direct LDL: 56.9 mg/dL

## 2013-05-03 LAB — HEPATIC FUNCTION PANEL
ALT: 36 U/L (ref 0–53)
AST: 38 U/L — ABNORMAL HIGH (ref 0–37)
Albumin: 4.1 g/dL (ref 3.5–5.2)
Alkaline Phosphatase: 74 U/L (ref 39–117)
Bilirubin, Direct: 0.1 mg/dL (ref 0.0–0.3)
Total Bilirubin: 0.6 mg/dL (ref 0.3–1.2)
Total Protein: 7.3 g/dL (ref 6.0–8.3)

## 2013-05-03 LAB — TSH: TSH: 0.33 u[IU]/mL — ABNORMAL LOW (ref 0.35–5.50)

## 2013-05-03 LAB — HEMOGLOBIN A1C: Hgb A1c MFr Bld: 6.5 % (ref 4.6–6.5)

## 2013-05-03 LAB — EKG 12-LEAD

## 2013-05-03 LAB — URIC ACID: Uric Acid, Serum: 6.7 mg/dL (ref 4.0–7.8)

## 2013-05-03 LAB — LIPID PANEL
Cholesterol: 139 mg/dL (ref 0–200)
HDL: 48.6 mg/dL (ref >39.00)
Total CHOL/HDL Ratio: 3
Triglycerides: 238 mg/dL — ABNORMAL HIGH (ref 0.0–149.0)
VLDL: 47.6 mg/dL — ABNORMAL HIGH (ref 0.0–40.0)

## 2013-05-03 MED ORDER — LEVOTHYROXINE SODIUM 200 MCG PO TABS: 200.0000 ug | ORAL_TABLET | Freq: Every day | ORAL | Status: DC

## 2013-05-03 NOTE — Patient Instructions (Signed)
please consider these measures for your health:  minimize alcohol.  do not use tobacco products.  have a colonoscopy at least every 10 years from age 70.  keep firearms safely stored.  always use seat belts.  have working smoke alarms in your home.  see an eye doctor and dentist regularly.  never drive under the influence of alcohol or drugs (including prescription drugs).  those with fair skin should take precautions against the sun. please let me know what your wishes would be, if artificial life support measures should become necessary.  it is critically important to prevent falling down (keep floor areas well-lit, dry, and free of loose objects.  If you have a cane, walker, or wheelchair, you should use it, even for short trips around the house.  Also, try not to rush) blood tests are being requested for you today.  We'll contact you with results. Please come back for a follow-up appointment in 3 months.

## 2013-05-03 NOTE — Progress Notes (Signed)
Subjective:    Patient ID: Devin [name], male    DOB: 1940/07/26, 72 y.o.   MRN: [id number]  HPI Pt returns for f/u of insulin-requiring DM (dx'ed 1997; he has mild if any neuropathy of the lower extremities; no associated complications).  no cbg record, but states cbg's are well-controlled.  He seldom has hypoglycemia, and this is mild.  He denies hypoglycemia.  no cbg record, but states cbg's are well-controlled.  There is no trend throughout the day. Past Medical History  Diagnosis Date  . VENTRAL HERNIA 04/14/2008  . PROSTATE CANCER, HX OF 12/29/2006  . OSTEOPOROSIS 12/29/2006  . 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 Hypocalcemia 04/13/2007  . HYPERTENSION 12/29/2006  . GOUT 12/29/2006  . DIABETES MELLITUS, TYPE I 12/29/2006  . CROHN'S DISEASE 04/14/2008  . 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 ANXIETY 12/29/2006  . Impotence   . Psoriasis     Past Surgical History  Procedure Laterality Date  . Total thyroidectomy  1997  . Tonsillectomy    . Cardiac catheterization  03/17/2003  . Stress cardiolite  02/17/2003  . Venous doppler  05/30/2004  . Electrocardiogram  03/27/2006  . Hernia repair  2009    ventral hernia    History   Social History  . Marital Status: Married    Spouse Name: N/A    Number of Children: N/A  . Years of Education: N/A   Occupational History  . Retired    Social History Main Topics  . Smoking status: Former Games developer  . Smokeless tobacco: Not on file  . Alcohol Use: Not on file  . Drug Use: Not on file  . Sexual Activity: Not on file   Other Topics Concern  . Not on file   Social History Narrative  . No narrative on file    Current Outpatient Prescriptions on File Prior to Visit  Medication Sig Dispense Refill  . atenolol (TENORMIN) 25 MG tablet TAKE 1/2 TABLET BY MOUTH ONCE  DAILY.  45 tablet  1  . B-D ULTRAFINE III SHORT PEN 31G X 8 MM MISC use four times a day  400 each  3  . busPIRone (BUSPAR) 15 MG tablet Take 1 tablet (15 mg total) by mouth 2 (two) times daily.  180 tablet  1  . Glucose Blood (ASCENSIA CONTOUR TEST VI) Use as directed to test two times a day       . glucose blood (BAYER CONTOUR TEST) test strip TEST TWICE DAILY AS DIRECTED. Dx code: 250.01  200 each  2  . insulin lispro (HUMALOG) 100 UNIT/ML injection before meals 3 times a day 25-25-30 units, and pen needles 4/day      . insulin NPH (HUMULIN N PEN) 100 UNIT/ML injection Inject 20 Units into the skin at bedtime.  20 mL  1  . Insulin Pen Needle (B-D ULTRAFINE III SHORT PEN) 31G X 8 MM MISC 1 each by Other route 4 (four) times daily.  400 each  1  . leuprolide (LUPRON) 11.25 MG injection Inject 11.25 mg into the muscle every 3 (three) months.      Marland Kitchen lisinopril-hydrochlorothiazide (PRINZIDE,ZESTORETIC) 20-12.5 MG per tablet Take 1 tablet by mouth daily.  90 tablet  1  . mesalamine (ASACOL) 400 MG EC tablet Take 4 tablets (1,600 mg total) by mouth 2 (two) times daily.  Take 4 tablets by mouth two times a day  720 tablet  1  . tadalafil (CIALIS) 20 MG tablet Take 1 tablet (20 mg total) by mouth as needed.  30 tablet  1  . terbinafine (LAMISIL) 250 MG tablet Take 250 mg by mouth daily.         No current facility-administered medications on file prior to visit.    No Known Allergies  Family History  Problem Relation Age of Onset  . Cancer Neg Hx     No cancer in the patient's immediate family, except of course for the patient himself, as noted    BP 138/68  Pulse 88  Temp(Src) 97.8 F (36.6 C) (Oral)  Resp 16  Ht 5\' 11"  (1.803 m)  Wt 237 lb 6.4 oz (107.684 kg)  BMI 33.13 kg/m2   Review of Systems  Constitutional: Negative for fever and unexpected weight change.  HENT: Negative for hearing loss.   Eyes: Negative for visual disturbance.  Respiratory: Negative for shortness of breath.    Cardiovascular: Negative for chest pain.  Gastrointestinal: Negative for anal bleeding.  Endocrine: Negative for cold intolerance.  Genitourinary: Negative for hematuria and difficulty urinating.  Musculoskeletal: Negative for back pain.  Skin: Negative for rash.  Allergic/Immunologic: Negative for environmental allergies.  Neurological: Negative for syncope and numbness.  Hematological: Does not bruise/bleed easily.  Psychiatric/Behavioral: Negative for dysphoric mood.       Objective:   Physical Exam VITAL SIGNS:  See vs page GENERAL: no distress NECK: a healed scar is present.  i do not appreciate a nodule in the thyroid or elsewhere in the neck LUNGS:  Clear to auscultation.   HEART:  Regular rate and rhythm without murmurs noted. Normal S1,S2.     Lab Results  Component Value Date   WBC 8.6 05/03/2013   HGB 13.8 05/03/2013   HCT 41.2 05/03/2013   PLT 279.0 05/03/2013   GLUCOSE 114* 05/03/2013   CHOL 139 05/03/2013   TRIG 238.0* 05/03/2013   HDL 48.60 05/03/2013   LDLDIRECT 56.9 05/03/2013   LDLCALC Comment:   Not calculated due to Triglyceride >400. Suggest ordering Direct LDL (Unit Code: 40981).   Total Cholesterol/HDL Ratio:CHD Risk                        Coronary Heart Disease Risk Table                                        Men       Women          1/2 Average Risk              3.4        3.3              Average Risk              5.0        4.4           2X Average Risk              9.6        7.1           3X Average Risk             23.4       11.0 Use the calculated Patient Ratio above and  the CHD Risk table  to determine the patient's CHD Risk. ATP III Classification (LDL):       < 100        mg/dL         Optimal      161 - 129     mg/dL         Near or Above Optimal      130 - 159     mg/dL         Borderline High      160 - 189     mg/dL         High       > 096        mg/dL         Very High   04/54/0981   ALT 36 05/03/2013   AST 38* 05/03/2013   NA 139  05/03/2013   K 4.1 05/03/2013   CL 103 05/03/2013   CREATININE 1.5 05/03/2013   BUN 19 05/03/2013   CO2 25 05/03/2013   TSH 0.33* 05/03/2013   PSA 0.00 Repeated and verified X2.* 05/03/2013   HGBA1C 6.5 05/03/2013   MICROALBUR 0.2 05/03/2013   Lab Results  Component Value Date   PTH 45.2 04/29/2012   CALCIUM 9.6 05/03/2013      Assessment & Plan:  DM: well-controlled Postsurgical hypothyroidism, well-replaced. Dyslipidemia: well-controlled Postsurgical hypocalcemia, resolved    Subjective:   Patient here for Medicare annual wellness visit and management of other chronic and acute problems.     Risk factors: advanced age    Roster of Physicians Providing Medical Care to Patient:  See "snapshot"   Activities of Daily Living: In your present state of health, do you have any difficulty performing the following activities?:  Preparing food and eating?: No  Bathing yourself: No  Getting dressed: No  Using the toilet:No  Moving around from place to place: No  In the past year have you fallen or had a near fall?:No    Home Safety: Has smoke detector and wears seat belts. No firearms. No excess sun exposure.  Diet and Exercise  Current exercise habits: pt says good Dietary issues discussed: pt reports healthy diet   Depression Screen  Q1: Over the past two weeks, have you felt down, depressed or hopeless?no  Q2: Over the past two weeks, have you felt little interest or pleasure in doing things? no   The following portions of the patient's history were reviewed and updated as appropriate: allergies, current medications, past family history, past medical history, past social history, past surgical history and problem list.  Past Medical History  Diagnosis Date  . VENTRAL HERNIA 04/14/2008  . PROSTATE CANCER, HX OF 12/29/2006  . OSTEOPOROSIS 12/29/2006  . 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 Hypocalcemia 04/13/2007  . HYPERTENSION 12/29/2006  . GOUT 12/29/2006  .  DIABETES MELLITUS, TYPE I 12/29/2006  . CROHN'S DISEASE 04/14/2008  . 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 ANXIETY 12/29/2006  . Impotence   . Psoriasis     Past Surgical History  Procedure Laterality Date  . Total thyroidectomy  1997  . Tonsillectomy    . Cardiac catheterization  03/17/2003  . Stress cardiolite  02/17/2003  . Venous doppler  05/30/2004  . Electrocardiogram  03/27/2006  . Hernia repair  2009  ventral hernia    History   Social History  . Marital Status: Married    Spouse Name: N/A    Number of Children: N/A  . Years of Education: N/A   Occupational History  . Retired    Social History Main Topics  . Smoking status: Former Games developer  . Smokeless tobacco: Not on file  . Alcohol Use: Not on file  . Drug Use: Not on file  . Sexual Activity: Not on file   Other Topics Concern  . Not on file   Social History Narrative  . No narrative on file    Current Outpatient Prescriptions on File Prior to Visit  Medication Sig Dispense Refill  . atenolol (TENORMIN) 25 MG tablet TAKE 1/2 TABLET BY MOUTH ONCE DAILY.  45 tablet  1  . B-D ULTRAFINE III SHORT PEN 31G X 8 MM MISC use four times a day  400 each  3  . busPIRone (BUSPAR) 15 MG tablet Take 1 tablet (15 mg total) by mouth 2 (two) times daily.  180 tablet  1  . Glucose Blood (ASCENSIA CONTOUR TEST VI) Use as directed to test two times a day       . glucose blood (BAYER CONTOUR TEST) test strip TEST TWICE DAILY AS DIRECTED. Dx code: 250.01  200 each  2  . insulin lispro (HUMALOG) 100 UNIT/ML injection before meals 3 times a day 25-25-30 units, and pen needles 4/day      . insulin NPH (HUMULIN N PEN) 100 UNIT/ML injection Inject 20 Units into the skin at bedtime.  20 mL  1  . Insulin Pen Needle (B-D ULTRAFINE III SHORT PEN) 31G X 8 MM MISC 1 each by Other  route 4 (four) times daily.  400 each  1  . leuprolide (LUPRON) 11.25 MG injection Inject 11.25 mg into the muscle every 3 (three) months.      . levothyroxine (SYNTHROID, LEVOTHROID) 200 MCG tablet Take 1 tablet (200 mcg total) by mouth daily before breakfast.  30 tablet  2  . lisinopril-hydrochlorothiazide (PRINZIDE,ZESTORETIC) 20-12.5 MG per tablet Take 1 tablet by mouth daily.  90 tablet  1  . mesalamine (ASACOL) 400 MG EC tablet Take 4 tablets (1,600 mg total) by mouth 2 (two) times daily. Take 4 tablets by mouth two times a day  720 tablet  1  . tadalafil (CIALIS) 20 MG tablet Take 1 tablet (20 mg total) by mouth as needed.  30 tablet  1  . terbinafine (LAMISIL) 250 MG tablet Take 250 mg by mouth daily.         No current facility-administered medications on file prior to visit.    No Known Allergies  Family History  Problem Relation Age of Onset  . Cancer Neg Hx     No cancer in the patient's immediate family, except of course for the patient himself, as noted    BP 138/68  Pulse 88  Temp(Src) 97.8 F (36.6 C) (Oral)  Resp 16  Ht 5\' 11"  (1.803 m)  Wt 237 lb 6.4 oz (107.684 kg)  BMI 33.13 kg/m2   Review of Systems  Denies hearing loss, and visual loss Objective:   Vision:  Sees opthalmologist Hearing: grossly normal Body mass index:  See vs page Msk: pt easily and quickly performs "get-up-and-go" from a sitting position Cognitive Impairment Assessment: cognition, memory and judgment appear normal.  remembers 3/3 at 5 minutes.  excellent recall.  can easily read and write a sentence.  alert and  oriented x 3.     Assessment:   Medicare wellness utd on preventive parameters    Plan:   During the course of the visit the patient was educated and counseled about appropriate screening and preventive services including:       Fall prevention   Diabetes screening  Nutrition counseling   Vaccines / LABS Zostavax / Pneumococcal Vaccine  today  PSA  Patient  Instructions (the written plan) was given to the patient.

## 2013-05-04 ENCOUNTER — Encounter: Payer: Self-pay | Admitting: *Deleted

## 2013-05-04 LAB — PSA: PSA: 0 ng/mL — ABNORMAL LOW (ref 0.10–4.00)

## 2013-05-04 LAB — PTH, INTACT AND CALCIUM
Calcium: 9.9 mg/dL (ref 8.4–10.5)
PTH: 48 pg/mL (ref 14.0–72.0)

## 2013-05-04 LAB — THYROGLOBULIN LEVEL: Thyroglobulin: <0.2 ng/mL (ref 0.0–55.0)

## 2013-05-04 LAB — THYROGLOBULIN ANTIBODY: Thyroglobulin Ab: <20 U/mL (ref <40.0)

## 2013-05-25 ENCOUNTER — Other Ambulatory Visit: Payer: Self-pay

## 2013-05-25 DIAGNOSIS — L570 Actinic keratosis: Secondary | ICD-10-CM | POA: Diagnosis not present

## 2013-05-25 DIAGNOSIS — L821 Other seborrheic keratosis: Secondary | ICD-10-CM | POA: Diagnosis not present

## 2013-05-25 DIAGNOSIS — C44319 Basal cell carcinoma of skin of other parts of face: Secondary | ICD-10-CM | POA: Diagnosis not present

## 2013-05-25 DIAGNOSIS — Z85828 Personal history of other malignant neoplasm of skin: Secondary | ICD-10-CM | POA: Diagnosis not present

## 2013-05-25 MED ORDER — INSULIN LISPRO 100 UNIT/ML ~~LOC~~ SOLN: SUBCUTANEOUS | Status: DC

## 2013-05-25 NOTE — Telephone Encounter (Signed)
Humalog refill sent to [facility]

## 2013-06-09 DIAGNOSIS — C61 Malignant neoplasm of prostate: Secondary | ICD-10-CM | POA: Diagnosis not present

## 2013-06-16 DIAGNOSIS — C61 Malignant neoplasm of prostate: Secondary | ICD-10-CM | POA: Diagnosis not present

## 2013-06-21 ENCOUNTER — Other Ambulatory Visit: Payer: Self-pay

## 2013-06-21 MED ORDER — GLUCOSE BLOOD VI STRP: ORAL_STRIP | Status: DC

## 2013-07-05 ENCOUNTER — Other Ambulatory Visit: Payer: Self-pay

## 2013-07-05 MED ORDER — ATENOLOL 25 MG PO TABS: ORAL_TABLET | ORAL | Status: DC

## 2013-08-10 DIAGNOSIS — K5090 Crohn's disease, unspecified, without complications: Secondary | ICD-10-CM | POA: Diagnosis not present

## 2013-08-26 ENCOUNTER — Other Ambulatory Visit: Payer: Self-pay | Admitting: Endocrinology

## 2013-09-02 ENCOUNTER — Other Ambulatory Visit: Payer: Self-pay

## 2013-09-02 ENCOUNTER — Telehealth: Payer: Self-pay

## 2013-09-02 MED ORDER — BUSPIRONE HCL 15 MG PO TABS: 15.0000 mg | ORAL_TABLET | Freq: Two times a day (BID) | ORAL | Status: DC

## 2013-09-02 MED ORDER — LISINOPRIL-HYDROCHLOROTHIAZIDE 20-12.5 MG PO TABS: 1.0000 | ORAL_TABLET | Freq: Every day | ORAL | Status: DC

## 2013-09-02 NOTE — Telephone Encounter (Signed)
Received fax from pharmacy requesting a refill on Buspirone. Pt was last seen on 05/03/2013 and medication was last filled 03/12/2013.  Please advise, Thanks!

## 2013-09-02 NOTE — Telephone Encounter (Signed)
Done

## 2013-09-02 NOTE — Telephone Encounter (Signed)
Please refill prn 

## 2013-10-08 DIAGNOSIS — C61 Malignant neoplasm of prostate: Secondary | ICD-10-CM | POA: Diagnosis not present

## 2013-10-13 ENCOUNTER — Other Ambulatory Visit: Payer: Self-pay

## 2013-10-13 MED ORDER — INSULIN PEN NEEDLE 31G X 8 MM MISC: Status: DC

## 2013-10-14 DIAGNOSIS — C61 Malignant neoplasm of prostate: Secondary | ICD-10-CM | POA: Diagnosis not present

## 2013-11-24 DIAGNOSIS — Z85828 Personal history of other malignant neoplasm of skin: Secondary | ICD-10-CM | POA: Diagnosis not present

## 2013-11-24 DIAGNOSIS — L821 Other seborrheic keratosis: Secondary | ICD-10-CM | POA: Diagnosis not present

## 2013-11-24 DIAGNOSIS — L570 Actinic keratosis: Secondary | ICD-10-CM | POA: Diagnosis not present

## 2013-11-24 DIAGNOSIS — D1801 Hemangioma of skin and subcutaneous tissue: Secondary | ICD-10-CM | POA: Diagnosis not present

## 2013-11-26 ENCOUNTER — Other Ambulatory Visit: Payer: Self-pay | Admitting: *Deleted

## 2013-11-26 MED ORDER — LISINOPRIL-HYDROCHLOROTHIAZIDE 20-12.5 MG PO TABS: 1.0000 | ORAL_TABLET | Freq: Every day | ORAL | Status: DC

## 2013-12-07 ENCOUNTER — Other Ambulatory Visit: Payer: Self-pay

## 2013-12-07 MED ORDER — GLUCOSE BLOOD VI STRP: ORAL_STRIP | Status: DC

## 2014-01-19 ENCOUNTER — Encounter: Payer: Self-pay | Admitting: Endocrinology

## 2014-01-19 ENCOUNTER — Other Ambulatory Visit: Payer: Self-pay

## 2014-01-19 ENCOUNTER — Ambulatory Visit (INDEPENDENT_AMBULATORY_CARE_PROVIDER_SITE_OTHER): Payer: Medicare Other | Admitting: Endocrinology

## 2014-01-19 VITALS — BP 116/68 | HR 82 | Temp 97.8°F | Ht 71.0 in | Wt 237.0 lb

## 2014-01-19 DIAGNOSIS — E890 Postprocedural hypothyroidism: Secondary | ICD-10-CM | POA: Diagnosis not present

## 2014-01-19 DIAGNOSIS — E109 Type 1 diabetes mellitus without complications: Secondary | ICD-10-CM

## 2014-01-19 DIAGNOSIS — C73 Malignant neoplasm of thyroid gland: Secondary | ICD-10-CM | POA: Diagnosis not present

## 2014-01-19 LAB — HEMOGLOBIN A1C: Hgb A1c MFr Bld: 6.5 % (ref 4.6–6.5)

## 2014-01-19 LAB — TSH: TSH: 0.08 u[IU]/mL — ABNORMAL LOW (ref 0.35–4.50)

## 2014-01-19 MED ORDER — INSULIN PEN NEEDLE 31G X 8 MM MISC: Status: DC

## 2014-01-19 NOTE — Progress Notes (Signed)
Subjective:    Patient ID: Devin [name], male    DOB: [birth year], 73 y.o.   MRN: [id number]  HPI Pt returns for f/u of insulin-requiring DM (dx'ed 1997; he has mild if any neuropathy of the lower extremities; no associated complications; he has never had pancreatitis, severe hypoglycemia, or DKA; he takes multiple daily injections).  no cbg record, but states cbg's are well-controlled.  He seldom has hypoglycemia, and this is mild.  He denies hypoglycemia.  no cbg record, but states cbg's are well-controlled.  There is no trend throughout the day.    Pt also returns for f/u of thyroid cancer: he denies any lump in the neck.   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 total thyroidectomy 1997 (pathology is unknown).  5/05: total body scan is neg. 11/10:  tg is undet (ab neg). 11/12: tg is undet (ab neg). 11/14: tg is undet (ab neg) Past Medical History  Diagnosis Date  . VENTRAL HERNIA 04/14/2008  . PROSTATE CANCER, HX OF 12/29/2006  . OSTEOPOROSIS 12/29/2006  . 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 Hypocalcemia 04/13/2007  . HYPERTENSION 12/29/2006  . GOUT 12/29/2006  . DIABETES MELLITUS, TYPE I 12/29/2006  . CROHN'S DISEASE 04/14/2008  . 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 ANXIETY 12/29/2006  . Impotence   . Psoriasis     Past Surgical History  Procedure Laterality Date  . Total thyroidectomy  1997  . Tonsillectomy    . Cardiac catheterization  03/17/2003  . Stress cardiolite  02/17/2003  . Venous doppler  05/30/2004  . Electrocardiogram  03/27/2006  . Hernia repair  2009    ventral hernia    History   Social History  . Marital Status: Married    Spouse Name: N/A    Number of Children: N/A  . Years of Education: N/A   Occupational History  . Retired    Social History Main Topics  . Smoking status:  Former Research scientist (life sciences)  . Smokeless tobacco: Not on file  . Alcohol Use: Not on file  . Drug Use: Not on file  . Sexual Activity: Not on file   Other Topics Concern  . Not on file   Social History Narrative  . No narrative on file    Current Outpatient Prescriptions on File Prior to Visit  Medication Sig Dispense Refill  . atenolol (TENORMIN) 25 MG tablet TAKE 1/2 TABLET BY MOUTH ONCE DAILY.  45 tablet  1  . busPIRone (BUSPAR) 15 MG tablet Take 1 tablet (15 mg total) by mouth 2 (two) times daily.  180 tablet  PRN  . Glucose Blood (ASCENSIA CONTOUR TEST VI) Use as directed to test two times a day       . glucose blood (BAYER CONTOUR TEST) test strip TEST TWICE DAILY AS DIRECTED. Dx code: 250.01  200 each  0  . insulin lispro (HUMALOG) 100 UNIT/ML injection before meals 3 times a day 25-25-30 units, and pen needles 4/day  30 mL  12  . insulin NPH (HUMULIN N PEN) 100 UNIT/ML injection Inject 20 Units into the skin at bedtime.  20 mL  1  . Insulin Pen Needle (B-D ULTRAFINE III SHORT PEN) 31G X 8 MM MISC 1 each by Other route 4 (four) times daily.  400 each  1  . leuprolide (LUPRON) 11.25 MG injection Inject 11.25  mg into the muscle every 3 (three) months.      Marland Kitchen lisinopril-hydrochlorothiazide (PRINZIDE,ZESTORETIC) 20-12.5 MG per tablet Take 1 tablet by mouth daily.  90 tablet  0  . mesalamine (ASACOL) 400 MG EC tablet Take 4 tablets (1,600 mg total) by mouth 2 (two) times daily. Take 4 tablets by mouth two times a day  720 tablet  1  . tadalafil (CIALIS) 20 MG tablet Take 1 tablet (20 mg total) by mouth as needed.  30 tablet  1  . terbinafine (LAMISIL) 250 MG tablet Take 250 mg by mouth daily.         No current facility-administered medications on file prior to visit.    No Known Allergies  Family History  Problem Relation Age of Onset  . Cancer Neg Hx     No cancer in the patient's immediate family, except of course for the patient himself, as noted    BP 116/68  Pulse 82  Temp(Src)  97.8 F (36.6 C) (Oral)  Ht 5\' 11"  (1.803 m)  Wt 237 lb (107.502 kg)  BMI 33.07 kg/m2  SpO2 97%    Review of Systems Denies sob and hypoglycemia.      Objective:   Physical Exam Pulses: dorsalis pedis intact bilat.   Feet: no deformity. normal color and temp.  1+ bilat leg edema. Skin:  no ulcer on the feet.   Neuro: sensation is intact to touch on the feet   Lab Results  Component Value Date   HGBA1C 6.5 01/19/2014   Lab Results  Component Value Date   TSH 0.08* 01/19/2014   TG is undetrectable    Assessment & Plan:  DM: well-controlled Postsurgical hypothyroidism: overreplaced Thyroid cancer: no evidence of recurrence.   Patient is advised the following: Patient Instructions  blood tests are being requested for you today.  We'll contact you with results.  Please come back for a follow-up appointment in 3 months.   check your blood sugar twice a day.  vary the time of day when you check, between before the 3 meals, and at bedtime.  also check if you have symptoms of your blood sugar being too high or too low.  please keep a record of the readings and bring it to your next appointment here.  You can write it on any piece of paper.  please call us sooner if your blood sugar goes below 70, or if you have a lot of readings over 200.

## 2014-01-19 NOTE — Patient Instructions (Signed)
blood tests are being requested for you today.  We'll contact you with results.  Please come back for a follow-up appointment in 3 months.   check your blood sugar twice a day.  vary the time of day when you check, between before the 3 meals, and at bedtime.  also check if you have symptoms of your blood sugar being too high or too low.  please keep a record of the readings and bring it to your next appointment here.  You can write it on any piece of paper.  please call us sooner if your blood sugar goes below 70, or if you have a lot of readings over 200.

## 2014-01-20 LAB — THYROGLOBULIN ANTIBODY: Thyroglobulin Ab: <20 IU/mL (ref <40.0)

## 2014-01-20 LAB — THYROGLOBULIN LEVEL: Thyroglobulin: <0.2 ng/mL (ref 0.0–55.0)

## 2014-01-20 MED ORDER — LEVOTHYROXINE SODIUM 175 MCG PO TABS: 175.0000 ug | ORAL_TABLET | Freq: Every day | ORAL | Status: DC

## 2014-01-21 DIAGNOSIS — H40019 Open angle with borderline findings, low risk, unspecified eye: Secondary | ICD-10-CM | POA: Diagnosis not present

## 2014-01-21 DIAGNOSIS — E119 Type 2 diabetes mellitus without complications: Secondary | ICD-10-CM | POA: Diagnosis not present

## 2014-01-21 DIAGNOSIS — H524 Presbyopia: Secondary | ICD-10-CM | POA: Diagnosis not present

## 2014-03-07 ENCOUNTER — Other Ambulatory Visit: Payer: Self-pay | Admitting: Endocrinology

## 2014-03-09 DIAGNOSIS — Z85828 Personal history of other malignant neoplasm of skin: Secondary | ICD-10-CM | POA: Diagnosis not present

## 2014-03-09 DIAGNOSIS — C4442 Squamous cell carcinoma of skin of scalp and neck: Secondary | ICD-10-CM | POA: Diagnosis not present

## 2014-04-13 DIAGNOSIS — C4442 Squamous cell carcinoma of skin of scalp and neck: Secondary | ICD-10-CM | POA: Diagnosis not present

## 2014-04-13 DIAGNOSIS — Z85828 Personal history of other malignant neoplasm of skin: Secondary | ICD-10-CM | POA: Diagnosis not present

## 2014-04-15 DIAGNOSIS — C61 Malignant neoplasm of prostate: Secondary | ICD-10-CM | POA: Diagnosis not present

## 2014-04-21 ENCOUNTER — Ambulatory Visit: Payer: Medicare Other | Admitting: Endocrinology

## 2014-04-22 DIAGNOSIS — C61 Malignant neoplasm of prostate: Secondary | ICD-10-CM | POA: Diagnosis not present

## 2014-04-22 DIAGNOSIS — C7951 Secondary malignant neoplasm of bone: Secondary | ICD-10-CM | POA: Diagnosis not present

## 2014-05-03 ENCOUNTER — Ambulatory Visit (INDEPENDENT_AMBULATORY_CARE_PROVIDER_SITE_OTHER): Payer: Medicare Other | Admitting: Endocrinology

## 2014-05-03 ENCOUNTER — Encounter: Payer: Self-pay | Admitting: Endocrinology

## 2014-05-03 VITALS — BP 132/66 | HR 72 | Temp 98.6°F | Ht 71.0 in | Wt 238.0 lb

## 2014-05-03 DIAGNOSIS — E890 Postprocedural hypothyroidism: Secondary | ICD-10-CM

## 2014-05-03 DIAGNOSIS — Z0000 Encounter for general adult medical examination without abnormal findings: Secondary | ICD-10-CM

## 2014-05-03 DIAGNOSIS — E109 Type 1 diabetes mellitus without complications: Secondary | ICD-10-CM

## 2014-05-03 DIAGNOSIS — M810 Age-related osteoporosis without current pathological fracture: Secondary | ICD-10-CM | POA: Diagnosis not present

## 2014-05-03 DIAGNOSIS — I10 Essential (primary) hypertension: Secondary | ICD-10-CM

## 2014-05-03 DIAGNOSIS — Z23 Encounter for immunization: Secondary | ICD-10-CM | POA: Diagnosis not present

## 2014-05-03 DIAGNOSIS — M1A9XX0 Chronic gout, unspecified, without tophus (tophi): Secondary | ICD-10-CM

## 2014-05-03 LAB — CBC WITH DIFFERENTIAL/PLATELET
Basophils Absolute: 0 10*3/uL (ref 0.0–0.1)
Basophils Relative: 0.3 % (ref 0.0–3.0)
Eosinophils Absolute: 0.2 10*3/uL (ref 0.0–0.7)
Eosinophils Relative: 2 % (ref 0.0–5.0)
HCT: 41.1 % (ref 39.0–52.0)
Hemoglobin: 13.5 g/dL (ref 13.0–17.0)
Lymphocytes Relative: 18.4 % (ref 12.0–46.0)
Lymphs Abs: 2 10*3/uL (ref 0.7–4.0)
MCHC: 32.9 g/dL (ref 30.0–36.0)
MCV: 93.4 fl (ref 78.0–100.0)
Monocytes Absolute: 0.7 10*3/uL (ref 0.1–1.0)
Monocytes Relative: 6.5 % (ref 3.0–12.0)
Neutro Abs: 7.8 10*3/uL — ABNORMAL HIGH (ref 1.4–7.7)
Neutrophils Relative %: 72.8 % (ref 43.0–77.0)
Platelets: 265 10*3/uL (ref 150.0–400.0)
RBC: 4.4 Mil/uL (ref 4.22–5.81)
RDW: 14.1 % (ref 11.5–15.5)
WBC: 10.7 10*3/uL — ABNORMAL HIGH (ref 4.0–10.5)

## 2014-05-03 LAB — URINALYSIS, ROUTINE W REFLEX MICROSCOPIC
Bilirubin Urine: NEGATIVE
Hgb urine dipstick: NEGATIVE
Ketones, ur: NEGATIVE
Leukocytes, UA: NEGATIVE
Nitrite: NEGATIVE
RBC / HPF: NONE SEEN (ref 0–?)
Specific Gravity, Urine: 1.015 (ref 1.000–1.030)
Total Protein, Urine: NEGATIVE
Urine Glucose: NEGATIVE
Urobilinogen, UA: 0.2 (ref 0.0–1.0)
WBC, UA: NONE SEEN (ref 0–?)
pH: 5.5 (ref 5.0–8.0)

## 2014-05-03 LAB — BASIC METABOLIC PANEL
BUN: 20 mg/dL (ref 6–23)
CO2: 25 mEq/L (ref 19–32)
Calcium: 9.3 mg/dL (ref 8.4–10.5)
Chloride: 103 mEq/L (ref 96–112)
Creatinine, Ser: 1.5 mg/dL (ref 0.4–1.5)
GFR: 49 mL/min — ABNORMAL LOW (ref >60.00)
Glucose, Bld: 129 mg/dL — ABNORMAL HIGH (ref 70–99)
Potassium: 4 mEq/L (ref 3.5–5.1)
Sodium: 139 mEq/L (ref 135–145)

## 2014-05-03 LAB — LIPID PANEL
Cholesterol: 140 mg/dL (ref 0–200)
HDL: 43.3 mg/dL (ref >39.00)
NonHDL: 96.7
Total CHOL/HDL Ratio: 3
Triglycerides: 250 mg/dL — ABNORMAL HIGH (ref 0.0–149.0)
VLDL: 50 mg/dL — ABNORMAL HIGH (ref 0.0–40.0)

## 2014-05-03 LAB — MICROALBUMIN / CREATININE URINE RATIO
Creatinine,U: 73.9 mg/dL
Microalb Creat Ratio: 0.4 mg/g (ref 0.0–30.0)
Microalb, Ur: 0.3 mg/dL (ref 0.0–1.9)

## 2014-05-03 LAB — URIC ACID: Uric Acid, Serum: 6.1 mg/dL (ref 4.0–7.8)

## 2014-05-03 LAB — HEPATIC FUNCTION PANEL
ALT: 36 U/L (ref 0–53)
AST: 48 U/L — ABNORMAL HIGH (ref 0–37)
Albumin: 4 g/dL (ref 3.5–5.2)
Alkaline Phosphatase: 77 U/L (ref 39–117)
Bilirubin, Direct: 0.1 mg/dL (ref 0.0–0.3)
Total Bilirubin: 0.7 mg/dL (ref 0.2–1.2)
Total Protein: 6.9 g/dL (ref 6.0–8.3)

## 2014-05-03 LAB — TSH: TSH: 2.51 u[IU]/mL (ref 0.35–4.50)

## 2014-05-03 LAB — HEMOGLOBIN A1C: Hgb A1c MFr Bld: 6.5 % (ref 4.6–6.5)

## 2014-05-03 NOTE — Progress Notes (Signed)
we discussed code status.  pt requests full code, but would not want to be started or maintained on artificial life-support measures if there was not a reasonable chance of recovery 

## 2014-05-03 NOTE — Progress Notes (Signed)
Subjective:    Patient ID: Devin [name], male    DOB: 11-30-1940, 73 y.o.   MRN: [id number]  HPI  The state of at least three ongoing medical problems is addressed today, with interval history of each noted here: Pt returns for f/u of diabetes mellitus: DM type: Insulin-requiring type 2 Dx'ed: 3354 Complications: none Therapy: insulin DKA: never Severe hypoglycemia: never Pancreatitis: never Other: he takes multiple daily injections Interval history: no cbg record, but states cbg's are well-controlled.  He denies hypoglycemia.  There is no trend throughout the day. Gout: no recent sxs. Hypothyroidism: denies weight change. Past Medical History  Diagnosis Date  . VENTRAL HERNIA 04/14/2008  . PROSTATE CANCER, HX OF 12/29/2006  . OSTEOPOROSIS 12/29/2006  . 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 Hypocalcemia 04/13/2007  . HYPERTENSION 12/29/2006  . GOUT 12/29/2006  . DIABETES MELLITUS, TYPE I 12/29/2006  . CROHN'S DISEASE 04/14/2008  . 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 ANXIETY 12/29/2006  . Impotence   . Psoriasis     Past Surgical History  Procedure Laterality Date  . Total thyroidectomy  1997  . Tonsillectomy    . Cardiac catheterization  03/17/2003  . Stress cardiolite  02/17/2003  . Venous doppler  05/30/2004  . Electrocardiogram  03/27/2006  . Hernia repair  2009    ventral hernia    History   Social History  . Marital Status: Married    Spouse Name: N/A    Number of Children: N/A  . Years of Education: N/A   Occupational History  . Retired    Social History Main Topics  . Smoking status: Former Research scientist (life sciences)  . Smokeless tobacco: Not on file  . Alcohol Use: Not on file  . Drug Use: Not on file  . Sexual Activity: Not on file   Other Topics Concern  . Not on file   Social History Narrative     Current Outpatient Prescriptions on File Prior to Visit  Medication Sig Dispense Refill  . atenolol (TENORMIN) 25 MG tablet TAKE 1/2 TABLET BY MOUTH EVERY DAY 45 tablet 0  . busPIRone (BUSPAR) 15 MG tablet Take 1 tablet (15 mg total) by mouth 2 (two) times daily. 180 tablet PRN  . Glucose Blood (ASCENSIA CONTOUR TEST VI) Use as directed to test two times a day     . glucose blood (BAYER CONTOUR TEST) test strip TEST TWICE DAILY AS DIRECTED. Dx code: 250.01 200 each 0  . insulin lispro (HUMALOG) 100 UNIT/ML injection before meals 3 times a day 25-25-30 units, and pen needles 4/day 30 mL 12  . insulin NPH (HUMULIN N PEN) 100 UNIT/ML injection Inject 20 Units into the skin at bedtime. 20 mL 1  . Insulin Pen Needle (B-D ULTRAFINE III SHORT PEN) 31G X 8 MM MISC 1 each by Other route 4 (four) times daily. 400 each 1  . Insulin Pen Needle (B-D ULTRAFINE III SHORT PEN) 31G X 8 MM MISC use four times a day 400 each 1  . leuprolide (LUPRON) 11.25 MG injection Inject 11.25 mg into the muscle every 3 (three) months.    . levothyroxine (SYNTHROID, LEVOTHROID) 175 MCG tablet Take 1 tablet (175 mcg total) by mouth daily before breakfast. 90 tablet 3  . lisinopril-hydrochlorothiazide (PRINZIDE,ZESTORETIC) 20-12.5 MG per tablet TAKE 1 TABLET BY MOUTH EVERY DAY. Planada  tablet 0  . mesalamine (ASACOL) 400 MG EC tablet Take 4 tablets (1,600 mg total) by mouth 2 (two) times daily. Take 4 tablets by mouth two times a day 720 tablet 1  . tadalafil (CIALIS) 20 MG tablet Take 1 tablet (20 mg total) by mouth as needed. 30 tablet 1  . terbinafine (LAMISIL) 250 MG tablet Take 250 mg by mouth daily.       No current facility-administered medications on file prior to visit.    No Known Allergies  Family History  Problem Relation Age of Onset  . Cancer Neg Hx     No cancer in the patient's immediate family, except of course for the patient himself, as noted    BP 132/66 mmHg  Pulse 72  Temp(Src) 98.6 F (37 C)  (Oral)  Ht 5\' 11"  (1.803 m)  Wt 238 lb (107.956 kg)  BMI 33.21 kg/m2  SpO2 96%    Review of Systems Denies chest pain and sob    Objective:   Physical Exam VITAL SIGNS:  See vs page GENERAL: no distress Neck: a healed scar is present.  i do not appreciate a nodule in the thyroid or elsewhere in the neck LUNGS:  Clear to auscultation HEART:  Regular rate and rhythm without murmurs noted. Normal S1,S2.  No carotid bruit.  Pulses: dorsalis pedis intact bilat.   Feet: no deformity. normal color and temp. 1+ bilat leg edema.  Skin: no ulcer on the feet.   Neuro: sensation is intact to touch on the feet.     Lab Results  Component Value Date   WBC 10.7* 05/03/2014   HGB 13.5 05/03/2014   HCT 41.1 05/03/2014   PLT 265.0 05/03/2014   GLUCOSE 129* 05/03/2014   CHOL 140 05/03/2014   TRIG 250.0* 05/03/2014   HDL 43.30 05/03/2014   LDLDIRECT 50.6 05/03/2014   LDLCALC  04/29/2012     Comment:       Not calculated due to Triglyceride >400. Suggest ordering Direct LDL (Unit Code: [phone]).   Total Cholesterol/HDL Ratio:CHD Risk                        Coronary Heart Disease Risk Table                                        Men       Women          1/2 Average Risk              3.4        3.3              Average Risk              5.0        4.4           2X Average Risk              9.6        7.1           3X Average Risk             23.4       11.0 Use the calculated Patient Ratio above and the CHD Risk table  to determine the patient's CHD Risk. ATP III Classification (LDL):       < 100  mg/dL         Optimal      100 - 129     mg/dL         Near or Above Optimal      130 - 159     mg/dL         Borderline High      160 - 189     mg/dL         High       > 190        mg/dL         Very High     ALT 36 05/03/2014   AST 48* 05/03/2014   NA 139 05/03/2014   K 4.0 05/03/2014   CL 103 05/03/2014   CREATININE 1.5 05/03/2014   BUN 20 05/03/2014   CO2 25 05/03/2014    TSH 2.51 05/03/2014   PSA 0.00 Repeated and verified X2.* 05/03/2013   HGBA1C 6.5 05/03/2014   MICROALBUR 0.3 05/03/2014       Assessment & Plan:  DM: well-controlled.  same insulin Gout: well-controlled.  Please continue the same medication. Hypothyroidism: well-replaced.  Please continue the same medication.   Subjective:   Patient here for Medicare annual wellness visit and management of other chronic and acute problems.     Risk factors: advanced age    4 of Physicians Providing Medical Care to Patient:  See "snapshot"   Activities of Daily Living: In your present state of health, do you have any difficulty performing the following activities?:  Preparing food and eating?: No  Bathing yourself: No  Getting dressed: No  Using the toilet:No  Moving around from place to place: No  In the past year have you fallen or had a near fall?:No    Home Safety: Has smoke detector and wears seat belts. No firearms. No excess sun exposure.  Diet and Exercise  Current exercise habits: pt says pretty good Dietary issues discussed: pt reports a healthy diet   Depression Screen  Q1: Over the past two weeks, have you felt down, depressed or hopeless? no  Q2: Over the past two weeks, have you felt little interest or pleasure in doing things? no   The following portions of the patient's history were reviewed and updated as appropriate: allergies, current medications, past family history, past medical history, past social history, past surgical history and problem list.   Review of Systems  Denies hearing loss, and visual loss Objective:   Vision:  Sees opthalmologist Hearing: grossly normal Body mass index:  See vs page Msk: pt easily and quickly performs "get-up-and-go" from a sitting position Cognitive Impairment Assessment: cognition, memory and judgment appear normal.  remembers 3/3 at 5 minutes.  excellent recall.  can easily read and write a sentence.  alert and oriented  x 3   Assessment:   Medicare wellness utd on preventive parameters    Plan:   During the course of the visit the patient was educated and counseled about appropriate screening and preventive services including:        Fall prevention   Bone densitometry screening  Diabetes screening  Nutrition counseling   Vaccines / LABS Zostavax / Pneumococcal Vaccine  today  PSA  Patient Instructions (the written plan) was given to the patient.

## 2014-05-03 NOTE — Patient Instructions (Addendum)
please consider these measures for your health:  minimize alcohol.  do not use tobacco products.  have a colonoscopy at least every 10 years from age 71.  Women should have an annual mammogram from age 40.  keep firearms safely stored.  always use seat belts.  have working smoke alarms in your home.  see an eye doctor and dentist regularly.  never drive under the influence of alcohol or drugs (including prescription drugs).  those with fair skin should take precautions against the sun. it is critically important to prevent falling down (keep floor areas well-lit, dry, and free of loose objects.  If you have a cane, walker, or wheelchair, you should use it, even for short trips around the house.  Also, try not to rush).   blood tests are being requested for you today.  We'll let you know about the results. Please come back for a follow-up appointment in 3-4 months.   

## 2014-05-04 LAB — LDL CHOLESTEROL, DIRECT: Direct LDL: 50.6 mg/dL

## 2014-05-30 DIAGNOSIS — L814 Other melanin hyperpigmentation: Secondary | ICD-10-CM | POA: Diagnosis not present

## 2014-05-30 DIAGNOSIS — L570 Actinic keratosis: Secondary | ICD-10-CM | POA: Diagnosis not present

## 2014-05-30 DIAGNOSIS — L821 Other seborrheic keratosis: Secondary | ICD-10-CM | POA: Diagnosis not present

## 2014-05-30 DIAGNOSIS — Z85828 Personal history of other malignant neoplasm of skin: Secondary | ICD-10-CM | POA: Diagnosis not present

## 2014-06-06 ENCOUNTER — Other Ambulatory Visit: Payer: Self-pay | Admitting: Endocrinology

## 2014-08-06 ENCOUNTER — Other Ambulatory Visit: Payer: Self-pay | Admitting: Endocrinology

## 2014-08-16 DIAGNOSIS — R21 Rash and other nonspecific skin eruption: Secondary | ICD-10-CM | POA: Diagnosis not present

## 2014-08-16 DIAGNOSIS — K5080 Crohn's disease of both small and large intestine without complications: Secondary | ICD-10-CM | POA: Diagnosis not present

## 2014-08-24 ENCOUNTER — Other Ambulatory Visit: Payer: Self-pay | Admitting: Endocrinology

## 2014-09-01 ENCOUNTER — Ambulatory Visit: Payer: Medicare Other | Admitting: Endocrinology

## 2014-09-06 ENCOUNTER — Other Ambulatory Visit: Payer: Self-pay | Admitting: Urology

## 2014-09-06 DIAGNOSIS — Z79890 Hormone replacement therapy (postmenopausal): Secondary | ICD-10-CM

## 2014-09-06 DIAGNOSIS — E559 Vitamin D deficiency, unspecified: Secondary | ICD-10-CM

## 2014-09-08 ENCOUNTER — Other Ambulatory Visit: Payer: Self-pay | Admitting: Urology

## 2014-09-08 DIAGNOSIS — Z79899 Other long term (current) drug therapy: Secondary | ICD-10-CM

## 2014-09-08 DIAGNOSIS — C7951 Secondary malignant neoplasm of bone: Secondary | ICD-10-CM

## 2014-09-08 DIAGNOSIS — C61 Malignant neoplasm of prostate: Secondary | ICD-10-CM

## 2014-09-15 ENCOUNTER — Ambulatory Visit (INDEPENDENT_AMBULATORY_CARE_PROVIDER_SITE_OTHER): Payer: Medicare Other | Admitting: Endocrinology

## 2014-09-15 ENCOUNTER — Encounter: Payer: Self-pay | Admitting: Endocrinology

## 2014-09-15 VITALS — BP 134/70 | HR 73 | Temp 97.7°F | Ht 71.0 in | Wt 238.0 lb

## 2014-09-15 DIAGNOSIS — E109 Type 1 diabetes mellitus without complications: Secondary | ICD-10-CM

## 2014-09-15 DIAGNOSIS — C73 Malignant neoplasm of thyroid gland: Secondary | ICD-10-CM | POA: Diagnosis not present

## 2014-09-15 DIAGNOSIS — K50919 Crohn's disease, unspecified, with unspecified complications: Secondary | ICD-10-CM | POA: Diagnosis not present

## 2014-09-15 LAB — IBC PANEL
Iron: 101 ug/dL (ref 42–165)
Saturation Ratios: 24.6 % (ref 20.0–50.0)
Transferrin: 293 mg/dL (ref 212.0–360.0)

## 2014-09-15 LAB — HEMOGLOBIN A1C: Hgb A1c MFr Bld: 6.5 % (ref 4.6–6.5)

## 2014-09-15 NOTE — Progress Notes (Signed)
Subjective:    Patient ID: Devin [name], male    DOB: [birth year], 74 y.o.   MRN: [id number]  HPI The state of at least three ongoing medical problems is addressed today, with interval history of each noted here: Pt returns for f/u of diabetes mellitus: DM type: Insulin-requiring type 2 Dx'ed: 1660 Complications: none Therapy: insulin since 2002 DKA: never Severe hypoglycemia: never Pancreatitis: never Other: he takes multiple daily injections Interval history: no cbg record, but states cbg's are well-controlled.  He denies hypoglycemia.  There is no trend throughout the day. Pt also returns for f/u of thyroid cancer: he denies any lump in the neck.   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 total thyroidectomy 1997 (pathology is unknown).  5/05: total body scan is neg. 11/10:  tg is undet (ab neg). 11/12: tg is undet (ab neg). 11/14: tg is undet (ab neg) 8/15: tg is undet (ab neg) He denies any lump at the neck. Pt says he struck his right foot on furniture a few days ago.  It feel better now.  Past Medical History  Diagnosis Date  . VENTRAL HERNIA 04/14/2008  . PROSTATE CANCER, HX OF 12/29/2006  . OSTEOPOROSIS 12/29/2006  . 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 Hypocalcemia 04/13/2007  . HYPERTENSION 12/29/2006  . GOUT 12/29/2006  . DIABETES MELLITUS, TYPE I 12/29/2006  . CROHN'S DISEASE 04/14/2008  . 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 ANXIETY 12/29/2006  . Impotence   . Psoriasis     Past Surgical History  Procedure Laterality Date  . Total thyroidectomy  1997  . Tonsillectomy    . Cardiac catheterization  03/17/2003  . Stress cardiolite  02/17/2003  . Venous doppler  05/30/2004  . Electrocardiogram  03/27/2006  . Hernia repair  2009    ventral hernia    History   Social History  . Marital  Status: Married    Spouse Name: N/A  . Number of Children: N/A  . Years of Education: N/A   Occupational History  . Retired    Social History Main Topics  . Smoking status: Former Research scientist (life sciences)  . Smokeless tobacco: Not on file  . Alcohol Use: Not on file  . Drug Use: Not on file  . Sexual Activity: Not on file   Other Topics Concern  . Not on file   Social History Narrative    Current Outpatient Prescriptions on File Prior to Visit  Medication Sig Dispense Refill  . atenolol (TENORMIN) 25 MG tablet TAKE 0.5 TABLET BY MOUTH EVERY DAY 45 tablet 0  . BAYER CONTOUR TEST test strip TEST BLOOD SUGAR TWICE DAILY AS DIRECTED 200 each 1  . busPIRone (BUSPAR) 15 MG tablet Take 1 tablet (15 mg total) by mouth 2 (two) times daily. 180 tablet PRN  . Glucose Blood (ASCENSIA CONTOUR TEST VI) Use as directed to test two times a day     . insulin lispro (HUMALOG) 100 UNIT/ML injection before meals 3 times a day 25-25-30 units, and pen needles 4/day 30 mL 12  . insulin NPH (HUMULIN N PEN) 100 UNIT/ML injection Inject 20 Units into the skin at bedtime. 20 mL 1  . Insulin Pen Needle (B-D ULTRAFINE III SHORT PEN) 31G X 8 MM MISC 1 each by Other route 4 (four) times daily. 400 each 1  . Insulin Pen Needle (  B-D ULTRAFINE III SHORT PEN) 31G X 8 MM MISC use four times a day 400 each 1  . leuprolide (LUPRON) 11.25 MG injection Inject 11.25 mg into the muscle every 3 (three) months.    . levothyroxine (SYNTHROID, LEVOTHROID) 175 MCG tablet Take 1 tablet (175 mcg total) by mouth daily before breakfast. 90 tablet 3  . lisinopril-hydrochlorothiazide (PRINZIDE,ZESTORETIC) 20-12.5 MG per tablet TAKE 1 TABLET BY MOUTH EVERY DAY. 90 tablet 0  . mesalamine (ASACOL) 400 MG EC tablet Take 4 tablets (1,600 mg total) by mouth 2 (two) times daily. Take 4 tablets by mouth two times a day 720 tablet 1  . tadalafil (CIALIS) 20 MG tablet Take 1 tablet (20 mg total) by mouth as needed. 30 tablet 1  . terbinafine (LAMISIL) 250 MG  tablet Take 250 mg by mouth daily.       No current facility-administered medications on file prior to visit.    No Known Allergies  Family History  Problem Relation Age of Onset  . Cancer Neg Hx     No cancer in the patient's immediate family, except of course for the patient himself, as noted    BP 134/70 mmHg  Pulse 73  Temp(Src) 97.7 F (36.5 C) (Oral)  Ht 5\' 11"  (1.803 m)  Wt 238 lb (107.956 kg)  BMI 33.21 kg/m2  SpO2 97%  Review of Systems He denies weight change and edema    Objective:   Physical Exam VITAL SIGNS:  See vs page GENERAL: no distress Neck: a healed scar is present.  i do not appreciate a nodule in the thyroid or elsewhere in the neck Pulses: dorsalis pedis intact bilat.   MSK: no deformity of the feet CV: no leg edema Skin:  no ulcer on the feet.  normal color and temp on the feet. Neuro: sensation is intact to touch on the feet Ext: ecchymoses at the right 2nd and 3rd toes.    Lab Results  Component Value Date   HGBA1C 6.5 09/15/2014      Assessment & Plan:  DM: well-controlled Toe contusion, new Differentiated thyroid cancer.  No clinical evidence of recurrence.   Patient is advised the following: Patient Instructions  check your blood sugar twice a day.  vary the time of day when you check, between before the 3 meals, and at bedtime.  also check if you have symptoms of your blood sugar being too high or too low.  please keep a record of the readings and bring it to your next appointment here.  You can write it on any piece of paper.  please call us sooner if your blood sugar goes below 70, or if you have a lot of readings over 200. blood tests are requested for you today.  We'll let you know about the results. Please come back for a follow-up appointment in 4 months. Please call if your bruised toes do not feel better soon.

## 2014-09-15 NOTE — Patient Instructions (Signed)
check your blood sugar twice a day.  vary the time of day when you check, between before the 3 meals, and at bedtime.  also check if you have symptoms of your blood sugar being too high or too low.  please keep a record of the readings and bring it to your next appointment here.  You can write it on any piece of paper.  please call us sooner if your blood sugar goes below 70, or if you have a lot of readings over 200. blood tests are requested for you today.  We'll let you know about the results. Please come back for a follow-up appointment in 4 months. Please call if your bruised toes do not feel better soon.

## 2014-09-16 ENCOUNTER — Other Ambulatory Visit: Payer: Self-pay | Admitting: Endocrinology

## 2014-09-16 ENCOUNTER — Other Ambulatory Visit: Payer: Self-pay

## 2014-09-16 LAB — THYROGLOBULIN LEVEL: Thyroglobulin: 0.1 ng/mL — ABNORMAL LOW (ref 2.8–40.9)

## 2014-09-16 LAB — THYROGLOBULIN ANTIBODY: Thyroglobulin Ab: <1 IU/mL (ref <2)

## 2014-09-16 MED ORDER — INSULIN PEN NEEDLE 31G X 8 MM MISC: Status: DC

## 2014-09-26 ENCOUNTER — Other Ambulatory Visit: Payer: Self-pay | Admitting: Endocrinology

## 2014-10-21 ENCOUNTER — Ambulatory Visit
Admission: RE | Admit: 2014-10-21 | Discharge: 2014-10-21 | Disposition: A | Discharge status: Home / Self Care | Payer: Medicare Other | Source: Ambulatory Visit | Attending: Urology | Admitting: Urology

## 2014-10-21 DIAGNOSIS — C7951 Secondary malignant neoplasm of bone: Secondary | ICD-10-CM

## 2014-10-21 DIAGNOSIS — Z79899 Other long term (current) drug therapy: Secondary | ICD-10-CM

## 2014-10-21 DIAGNOSIS — C61 Malignant neoplasm of prostate: Secondary | ICD-10-CM | POA: Diagnosis not present

## 2014-10-27 DIAGNOSIS — C61 Malignant neoplasm of prostate: Secondary | ICD-10-CM | POA: Diagnosis not present

## 2014-10-27 DIAGNOSIS — C7951 Secondary malignant neoplasm of bone: Secondary | ICD-10-CM | POA: Diagnosis not present

## 2014-10-27 DIAGNOSIS — C779 Secondary and unspecified malignant neoplasm of lymph node, unspecified: Secondary | ICD-10-CM | POA: Diagnosis not present

## 2014-11-23 ENCOUNTER — Other Ambulatory Visit: Payer: Self-pay | Admitting: Endocrinology

## 2014-11-29 ENCOUNTER — Other Ambulatory Visit: Payer: Self-pay | Admitting: Endocrinology

## 2014-12-01 DIAGNOSIS — Z85828 Personal history of other malignant neoplasm of skin: Secondary | ICD-10-CM | POA: Diagnosis not present

## 2014-12-01 DIAGNOSIS — L570 Actinic keratosis: Secondary | ICD-10-CM | POA: Diagnosis not present

## 2014-12-01 DIAGNOSIS — D1801 Hemangioma of skin and subcutaneous tissue: Secondary | ICD-10-CM | POA: Diagnosis not present

## 2014-12-01 DIAGNOSIS — D2272 Melanocytic nevi of left lower limb, including hip: Secondary | ICD-10-CM | POA: Diagnosis not present

## 2014-12-01 DIAGNOSIS — D2271 Melanocytic nevi of right lower limb, including hip: Secondary | ICD-10-CM | POA: Diagnosis not present

## 2014-12-01 DIAGNOSIS — D224 Melanocytic nevi of scalp and neck: Secondary | ICD-10-CM | POA: Diagnosis not present

## 2014-12-01 DIAGNOSIS — L821 Other seborrheic keratosis: Secondary | ICD-10-CM | POA: Diagnosis not present

## 2014-12-09 HISTORY — PX: SPINAL FUSION: SHX223

## 2014-12-12 DIAGNOSIS — S3992XA Unspecified injury of lower back, initial encounter: Secondary | ICD-10-CM | POA: Diagnosis not present

## 2014-12-12 DIAGNOSIS — D72829 Elevated white blood cell count, unspecified: Secondary | ICD-10-CM | POA: Diagnosis not present

## 2014-12-12 DIAGNOSIS — M545 Low back pain: Secondary | ICD-10-CM | POA: Diagnosis not present

## 2014-12-12 DIAGNOSIS — I10 Essential (primary) hypertension: Secondary | ICD-10-CM | POA: Diagnosis present

## 2014-12-12 DIAGNOSIS — M47816 Spondylosis without myelopathy or radiculopathy, lumbar region: Secondary | ICD-10-CM | POA: Diagnosis not present

## 2014-12-12 DIAGNOSIS — K5090 Crohn's disease, unspecified, without complications: Secondary | ICD-10-CM | POA: Diagnosis not present

## 2014-12-12 DIAGNOSIS — M549 Dorsalgia, unspecified: Secondary | ICD-10-CM | POA: Diagnosis not present

## 2014-12-12 DIAGNOSIS — Z9981 Dependence on supplemental oxygen: Secondary | ICD-10-CM | POA: Diagnosis not present

## 2014-12-12 DIAGNOSIS — S22089A Unspecified fracture of T11-T12 vertebra, initial encounter for closed fracture: Secondary | ICD-10-CM | POA: Diagnosis not present

## 2014-12-12 DIAGNOSIS — E878 Other disorders of electrolyte and fluid balance, not elsewhere classified: Secondary | ICD-10-CM | POA: Diagnosis not present

## 2014-12-12 DIAGNOSIS — S32009A Unspecified fracture of unspecified lumbar vertebra, initial encounter for closed fracture: Secondary | ICD-10-CM | POA: Diagnosis not present

## 2014-12-12 DIAGNOSIS — R739 Hyperglycemia, unspecified: Secondary | ICD-10-CM | POA: Diagnosis not present

## 2014-12-12 DIAGNOSIS — S22009A Unspecified fracture of unspecified thoracic vertebra, initial encounter for closed fracture: Secondary | ICD-10-CM | POA: Diagnosis not present

## 2014-12-12 DIAGNOSIS — E109 Type 1 diabetes mellitus without complications: Secondary | ICD-10-CM | POA: Diagnosis not present

## 2014-12-12 DIAGNOSIS — E780 Pure hypercholesterolemia: Secondary | ICD-10-CM | POA: Diagnosis present

## 2014-12-12 DIAGNOSIS — T1490 Injury, unspecified: Secondary | ICD-10-CM | POA: Diagnosis not present

## 2014-12-12 DIAGNOSIS — R911 Solitary pulmonary nodule: Secondary | ICD-10-CM | POA: Diagnosis not present

## 2014-12-12 DIAGNOSIS — N179 Acute kidney failure, unspecified: Secondary | ICD-10-CM | POA: Diagnosis not present

## 2014-12-12 DIAGNOSIS — Z794 Long term (current) use of insulin: Secondary | ICD-10-CM | POA: Diagnosis not present

## 2014-12-12 DIAGNOSIS — S32019A Unspecified fracture of first lumbar vertebra, initial encounter for closed fracture: Secondary | ICD-10-CM | POA: Diagnosis not present

## 2014-12-12 DIAGNOSIS — D649 Anemia, unspecified: Secondary | ICD-10-CM | POA: Diagnosis not present

## 2014-12-12 DIAGNOSIS — R279 Unspecified lack of coordination: Secondary | ICD-10-CM | POA: Diagnosis not present

## 2014-12-12 DIAGNOSIS — M6281 Muscle weakness (generalized): Secondary | ICD-10-CM | POA: Diagnosis not present

## 2014-12-12 DIAGNOSIS — S32018A Other fracture of first lumbar vertebra, initial encounter for closed fracture: Secondary | ICD-10-CM | POA: Diagnosis not present

## 2014-12-12 DIAGNOSIS — E119 Type 2 diabetes mellitus without complications: Secondary | ICD-10-CM | POA: Diagnosis present

## 2014-12-14 ENCOUNTER — Inpatient Hospital Stay (HOSPITAL_COMMUNITY)
Admission: AD | Admit: 2014-12-14 | Discharge: 2014-12-21 | DRG: 457 | Disposition: A | Discharge status: Home Health | Payer: Medicare Other | Source: Other Acute Inpatient Hospital | Attending: Neurological Surgery | Admitting: Neurological Surgery

## 2014-12-14 ENCOUNTER — Other Ambulatory Visit: Payer: Self-pay | Admitting: Neurological Surgery

## 2014-12-14 DIAGNOSIS — J984 Other disorders of lung: Secondary | ICD-10-CM | POA: Diagnosis not present

## 2014-12-14 DIAGNOSIS — M4325 Fusion of spine, thoracolumbar region: Secondary | ICD-10-CM | POA: Diagnosis not present

## 2014-12-14 DIAGNOSIS — S32009A Unspecified fracture of unspecified lumbar vertebra, initial encounter for closed fracture: Secondary | ICD-10-CM | POA: Diagnosis not present

## 2014-12-14 DIAGNOSIS — S32018A Other fracture of first lumbar vertebra, initial encounter for closed fracture: Principal | ICD-10-CM | POA: Diagnosis present

## 2014-12-14 DIAGNOSIS — J90 Pleural effusion, not elsewhere classified: Secondary | ICD-10-CM | POA: Diagnosis not present

## 2014-12-14 DIAGNOSIS — Z981 Arthrodesis status: Secondary | ICD-10-CM

## 2014-12-14 DIAGNOSIS — E890 Postprocedural hypothyroidism: Secondary | ICD-10-CM | POA: Diagnosis present

## 2014-12-14 DIAGNOSIS — S22009A Unspecified fracture of unspecified thoracic vertebra, initial encounter for closed fracture: Secondary | ICD-10-CM | POA: Diagnosis not present

## 2014-12-14 DIAGNOSIS — S32019A Unspecified fracture of first lumbar vertebra, initial encounter for closed fracture: Secondary | ICD-10-CM | POA: Diagnosis not present

## 2014-12-14 DIAGNOSIS — M109 Gout, unspecified: Secondary | ICD-10-CM | POA: Diagnosis present

## 2014-12-14 DIAGNOSIS — Z794 Long term (current) use of insulin: Secondary | ICD-10-CM

## 2014-12-14 DIAGNOSIS — Z87891 Personal history of nicotine dependence: Secondary | ICD-10-CM

## 2014-12-14 DIAGNOSIS — S32012A Unstable burst fracture of first lumbar vertebra, initial encounter for closed fracture: Secondary | ICD-10-CM | POA: Diagnosis not present

## 2014-12-14 DIAGNOSIS — Z01818 Encounter for other preprocedural examination: Secondary | ICD-10-CM | POA: Diagnosis not present

## 2014-12-14 DIAGNOSIS — Z8546 Personal history of malignant neoplasm of prostate: Secondary | ICD-10-CM | POA: Diagnosis not present

## 2014-12-14 DIAGNOSIS — I10 Essential (primary) hypertension: Secondary | ICD-10-CM | POA: Diagnosis present

## 2014-12-14 DIAGNOSIS — Z79899 Other long term (current) drug therapy: Secondary | ICD-10-CM

## 2014-12-14 DIAGNOSIS — IMO0002 Reserved for concepts with insufficient information to code with codable children: Secondary | ICD-10-CM

## 2014-12-14 DIAGNOSIS — E109 Type 1 diabetes mellitus without complications: Secondary | ICD-10-CM | POA: Diagnosis present

## 2014-12-14 DIAGNOSIS — K5090 Crohn's disease, unspecified, without complications: Secondary | ICD-10-CM | POA: Diagnosis present

## 2014-12-14 DIAGNOSIS — S22089A Unspecified fracture of T11-T12 vertebra, initial encounter for closed fracture: Secondary | ICD-10-CM | POA: Diagnosis not present

## 2014-12-14 DIAGNOSIS — Z419 Encounter for procedure for purposes other than remedying health state, unspecified: Secondary | ICD-10-CM

## 2014-12-14 MED ORDER — ACETAMINOPHEN 325 MG PO TABS: 650.0000 mg | ORAL_TABLET | ORAL | Status: DC | PRN

## 2014-12-14 MED ORDER — LISINOPRIL 20 MG PO TABS
20.0000 mg | ORAL_TABLET | Freq: Every day | ORAL | Status: DC
  Administered 2014-12-15 – 2014-12-20 (×5): 20 mg via ORAL
  Filled 2014-12-14 (×6): qty 1

## 2014-12-14 MED ORDER — OXYCODONE-ACETAMINOPHEN 5-325 MG PO TABS
1.0000 | ORAL_TABLET | ORAL | Status: DC | PRN
  Administered 2014-12-14 – 2014-12-16 (×4): 2 via ORAL
  Filled 2014-12-14 (×4): qty 2

## 2014-12-14 MED ORDER — LISINOPRIL-HYDROCHLOROTHIAZIDE 20-12.5 MG PO TABS: 1.0000 | ORAL_TABLET | Freq: Every day | ORAL | Status: DC

## 2014-12-14 MED ORDER — MESALAMINE 400 MG PO CPDR
1600.0000 mg | DELAYED_RELEASE_CAPSULE | Freq: Two times a day (BID) | ORAL | Status: DC
  Administered 2014-12-14 – 2014-12-21 (×12): 1600 mg via ORAL
  Filled 2014-12-14 (×15): qty 4

## 2014-12-14 MED ORDER — ONDANSETRON HCL 4 MG/2ML IJ SOLN
4.0000 mg | INTRAMUSCULAR | Status: DC | PRN
  Administered 2014-12-18: 4 mg via INTRAVENOUS
  Filled 2014-12-14: qty 2

## 2014-12-14 MED ORDER — MORPHINE SULFATE 2 MG/ML IJ SOLN
1.0000 mg | INTRAMUSCULAR | Status: DC | PRN
  Administered 2014-12-15 – 2014-12-16 (×4): 4 mg via INTRAVENOUS
  Filled 2014-12-14 (×4): qty 2

## 2014-12-14 MED ORDER — METHOCARBAMOL 1000 MG/10ML IJ SOLN: 500.0000 mg | Freq: Four times a day (QID) | INTRAVENOUS | Status: DC | PRN

## 2014-12-14 MED ORDER — METHOCARBAMOL 500 MG PO TABS
500.0000 mg | ORAL_TABLET | Freq: Four times a day (QID) | ORAL | Status: DC | PRN
  Administered 2014-12-17 – 2014-12-21 (×6): 500 mg via ORAL
  Filled 2014-12-14 (×6): qty 1

## 2014-12-14 MED ORDER — SENNA 8.6 MG PO TABS
1.0000 | ORAL_TABLET | Freq: Two times a day (BID) | ORAL | Status: DC
  Filled 2014-12-14: qty 1

## 2014-12-14 MED ORDER — INSULIN ASPART 100 UNIT/ML ~~LOC~~ SOLN
0.0000 [IU] | Freq: Three times a day (TID) | SUBCUTANEOUS | Status: DC
  Administered 2014-12-15: 3 [IU] via SUBCUTANEOUS
  Administered 2014-12-15 (×2): 2 [IU] via SUBCUTANEOUS
  Administered 2014-12-16: 5 [IU] via SUBCUTANEOUS
  Administered 2014-12-16: 2 [IU] via SUBCUTANEOUS
  Administered 2014-12-17: 5 [IU] via SUBCUTANEOUS
  Administered 2014-12-17: 11 [IU] via SUBCUTANEOUS
  Administered 2014-12-17: 3 [IU] via SUBCUTANEOUS
  Administered 2014-12-18 – 2014-12-20 (×8): 5 [IU] via SUBCUTANEOUS
  Administered 2014-12-20 – 2014-12-21 (×3): 8 [IU] via SUBCUTANEOUS

## 2014-12-14 MED ORDER — POTASSIUM CHLORIDE IN NACL 20-0.9 MEQ/L-% IV SOLN
INTRAVENOUS | Status: DC
  Administered 2014-12-14 – 2014-12-16 (×3): via INTRAVENOUS
  Filled 2014-12-14 (×3): qty 1000

## 2014-12-14 MED ORDER — HYDROCHLOROTHIAZIDE 12.5 MG PO CAPS
12.5000 mg | ORAL_CAPSULE | Freq: Every day | ORAL | Status: DC
  Administered 2014-12-15 – 2014-12-21 (×6): 12.5 mg via ORAL
  Filled 2014-12-14 (×6): qty 1

## 2014-12-14 MED ORDER — SODIUM CHLORIDE 0.9 % IJ SOLN: 3.0000 mL | INTRAMUSCULAR | Status: DC | PRN

## 2014-12-14 MED ORDER — BUSPIRONE HCL 10 MG PO TABS
15.0000 mg | ORAL_TABLET | Freq: Two times a day (BID) | ORAL | Status: DC
  Administered 2014-12-14 – 2014-12-21 (×12): 15 mg via ORAL
  Filled 2014-12-14 (×13): qty 2

## 2014-12-14 MED ORDER — ACETAMINOPHEN 650 MG RE SUPP: 650.0000 mg | RECTAL | Status: DC | PRN

## 2014-12-14 MED ORDER — ATENOLOL 25 MG PO TABS
25.0000 mg | ORAL_TABLET | Freq: Every day | ORAL | Status: DC
  Administered 2014-12-15 – 2014-12-21 (×6): 25 mg via ORAL
  Filled 2014-12-14 (×6): qty 1

## 2014-12-14 MED ORDER — SODIUM CHLORIDE 0.9 % IJ SOLN
3.0000 mL | Freq: Two times a day (BID) | INTRAMUSCULAR | Status: DC
  Administered 2014-12-14 – 2014-12-15 (×3): 3 mL via INTRAVENOUS

## 2014-12-14 MED ORDER — LEVOTHYROXINE SODIUM 50 MCG PO TABS
175.0000 ug | ORAL_TABLET | Freq: Every day | ORAL | Status: DC
  Administered 2014-12-15: 175 ug via ORAL
  Filled 2014-12-14 (×3): qty 1

## 2014-12-14 MED ORDER — SODIUM CHLORIDE 0.9 % IV SOLN
250.0000 mL | INTRAVENOUS | Status: DC
  Administered 2014-12-14: 250 mL via INTRAVENOUS

## 2014-12-14 MED ORDER — CEFAZOLIN SODIUM 1-5 GM-% IV SOLN
1.0000 g | Freq: Three times a day (TID) | INTRAVENOUS | Status: AC
Start: 2014-12-14 — End: 2014-12-15
  Administered 2014-12-14 – 2014-12-15 (×2): 1 g via INTRAVENOUS
  Filled 2014-12-14 (×2): qty 50

## 2014-12-14 MED ORDER — TERBINAFINE HCL 250 MG PO TABS
250.0000 mg | ORAL_TABLET | Freq: Every day | ORAL | Status: DC
  Filled 2014-12-14: qty 1

## 2014-12-15 ENCOUNTER — Inpatient Hospital Stay (HOSPITAL_COMMUNITY): Payer: Medicare Other

## 2014-12-15 ENCOUNTER — Other Ambulatory Visit: Payer: Self-pay

## 2014-12-15 ENCOUNTER — Encounter (HOSPITAL_COMMUNITY): Payer: Self-pay | Admitting: Neurological Surgery

## 2014-12-15 LAB — CBC
HCT: 35.4 % — ABNORMAL LOW (ref 39.0–52.0)
Hemoglobin: 11.8 g/dL — ABNORMAL LOW (ref 13.0–17.0)
MCH: 31.3 pg (ref 26.0–34.0)
MCHC: 33.3 g/dL (ref 30.0–36.0)
MCV: 93.9 fL (ref 78.0–100.0)
Platelets: 193 10*3/uL (ref 150–400)
RBC: 3.77 MIL/uL — ABNORMAL LOW (ref 4.22–5.81)
RDW: 12.7 % (ref 11.5–15.5)
WBC: 8.7 10*3/uL (ref 4.0–10.5)

## 2014-12-15 LAB — GLUCOSE, CAPILLARY
Glucose-Capillary: 124 mg/dL — ABNORMAL HIGH (ref 65–99)
Glucose-Capillary: 150 mg/dL — ABNORMAL HIGH (ref 65–99)
Glucose-Capillary: 168 mg/dL — ABNORMAL HIGH (ref 65–99)
Glucose-Capillary: 127 mg/dL — ABNORMAL HIGH (ref 65–99)

## 2014-12-15 LAB — BASIC METABOLIC PANEL
Anion gap: 7 (ref 5–15)
BUN: 16 mg/dL (ref 6–20)
CO2: 25 mmol/L (ref 22–32)
Calcium: 8.3 mg/dL — ABNORMAL LOW (ref 8.9–10.3)
Chloride: 106 mmol/L (ref 101–111)
Creatinine, Ser: 1.29 mg/dL — ABNORMAL HIGH (ref 0.61–1.24)
GFR calc Af Amer: >60 mL/min (ref >60)
GFR calc non Af Amer: 54 mL/min — ABNORMAL LOW (ref >60)
Glucose, Bld: 134 mg/dL — ABNORMAL HIGH (ref 65–99)
Potassium: 4.3 mmol/L (ref 3.5–5.1)
Sodium: 138 mmol/L (ref 135–145)

## 2014-12-15 LAB — PROTIME-INR
INR: 1.18 (ref 0.00–1.49)
Prothrombin Time: 15.2 seconds (ref 11.6–15.2)

## 2014-12-15 NOTE — Progress Notes (Signed)
Pt arrived via ambulance from [facility] at 2100. Transferred from stretcher to bed. Family arrived shortly after and currently at bedside. On call neurosurgeon Dr. Sherley Bounds was informed pt has arrived to hospital. Orders were placed by MD. Pt c/o of pain requesting pain medicine. Pt stated hospital removed IV when he left and was transferred without an IV. IV placed to give pain medicine and placed on fluids.
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
## 2014-12-15 NOTE — Progress Notes (Signed)
Pt unable to void. Bladder scan 718cc. In and out cath pt and took out 700cc. Residual 20cc. Pt was in and out catheterized at hospital prior to [facility]. Educated patient that he will possibly need a Foley Catheter if he is still unable to void by noon.

## 2014-12-15 NOTE — Anesthesia Preprocedure Evaluation (Addendum)
Anesthesia Evaluation  Patient identified by MRN, date of birth, ID band Patient awake    Reviewed: Allergy & Precautions, NPO status , Patient's Chart, lab work & pertinent test results, reviewed documented beta blocker date and time   Airway Mallampati: II   Neck ROM: Full    Dental  (+) Teeth Intact, Dental Advisory Given   Pulmonary neg pulmonary ROS, former smoker,  breath sounds clear to auscultation        Cardiovascular hypertension, Pt. on medications Rhythm:Regular  EKG OK   Neuro/Psych Anxiety Un stable back FX    GI/Hepatic   Endo/Other  diabetes, Poorly Controlled, Type 2, Insulin Dependent  Renal/GU      Musculoskeletal   Abdominal (+)  Abdomen: soft.    Peds  Hematology 11/35   Anesthesia Other Findings   Reproductive/Obstetrics                           Anesthesia Physical Anesthesia Plan  ASA: III  Anesthesia Plan: General   Post-op Pain Management:    Induction: Intravenous  Airway Management Planned: Oral ETT  Additional Equipment:   Intra-op Plan:   Post-operative Plan: Extubation in OR  Informed Consent: I have reviewed the patients History and Physical, chart, labs and discussed the procedure including the risks, benefits and alternatives for the proposed anesthesia with the patient or authorized representative who has indicated his/her understanding and acceptance.     Plan Discussed with:   Anesthesia Plan Comments: (2 IV, multimodal pain rx, I ordered T&S (will want to verify))        Anesthesia Quick Evaluation

## 2014-12-16 ENCOUNTER — Encounter (HOSPITAL_COMMUNITY)
Admission: AD | Disposition: A | Discharge status: Home Health | Payer: Medicare Other | Source: Other Acute Inpatient Hospital | Attending: Neurological Surgery

## 2014-12-16 ENCOUNTER — Inpatient Hospital Stay (HOSPITAL_COMMUNITY): Payer: Medicare Other

## 2014-12-16 ENCOUNTER — Inpatient Hospital Stay (HOSPITAL_COMMUNITY): Admission: RE | Admit: 2014-12-16 | Payer: Medicare Other | Source: Ambulatory Visit | Admitting: Neurological Surgery

## 2014-12-16 ENCOUNTER — Inpatient Hospital Stay (HOSPITAL_COMMUNITY): Payer: Medicare Other | Admitting: Anesthesiology

## 2014-12-16 DIAGNOSIS — Z981 Arthrodesis status: Secondary | ICD-10-CM

## 2014-12-16 LAB — PROTIME-INR
INR: 1.19 (ref 0.00–1.49)
Prothrombin Time: 15.3 seconds — ABNORMAL HIGH (ref 11.6–15.2)

## 2014-12-16 LAB — BASIC METABOLIC PANEL
Anion gap: 7 (ref 5–15)
BUN: 13 mg/dL (ref 6–20)
CO2: 24 mmol/L (ref 22–32)
Calcium: 8.6 mg/dL — ABNORMAL LOW (ref 8.9–10.3)
Chloride: 107 mmol/L (ref 101–111)
Creatinine, Ser: 1.15 mg/dL (ref 0.61–1.24)
GFR calc Af Amer: >60 mL/min (ref >60)
GFR calc non Af Amer: >60 mL/min (ref >60)
Glucose, Bld: 146 mg/dL — ABNORMAL HIGH (ref 65–99)
Potassium: 4 mmol/L (ref 3.5–5.1)
Sodium: 138 mmol/L (ref 135–145)

## 2014-12-16 LAB — CBC WITH DIFFERENTIAL/PLATELET
Basophils Absolute: 0 10*3/uL (ref 0.0–0.1)
Basophils Relative: 0 % (ref 0–1)
Eosinophils Absolute: 0.3 10*3/uL (ref 0.0–0.7)
Eosinophils Relative: 3 % (ref 0–5)
HCT: 35.7 % — ABNORMAL LOW (ref 39.0–52.0)
Hemoglobin: 12.2 g/dL — ABNORMAL LOW (ref 13.0–17.0)
Lymphocytes Relative: 15 % (ref 12–46)
Lymphs Abs: 1.3 10*3/uL (ref 0.7–4.0)
MCH: 31.7 pg (ref 26.0–34.0)
MCHC: 34.2 g/dL (ref 30.0–36.0)
MCV: 92.7 fL (ref 78.0–100.0)
Monocytes Absolute: 0.6 10*3/uL (ref 0.1–1.0)
Monocytes Relative: 7 % (ref 3–12)
Neutro Abs: 6.3 10*3/uL (ref 1.7–7.7)
Neutrophils Relative %: 75 % (ref 43–77)
Platelets: 202 10*3/uL (ref 150–400)
RBC: 3.85 MIL/uL — ABNORMAL LOW (ref 4.22–5.81)
RDW: 12.6 % (ref 11.5–15.5)
WBC: 8.5 10*3/uL (ref 4.0–10.5)

## 2014-12-16 LAB — GLUCOSE, CAPILLARY
Glucose-Capillary: 135 mg/dL — ABNORMAL HIGH (ref 65–99)
Glucose-Capillary: 184 mg/dL — ABNORMAL HIGH (ref 65–99)
Glucose-Capillary: 202 mg/dL — ABNORMAL HIGH (ref 65–99)

## 2014-12-16 LAB — TYPE AND SCREEN
ABO/RH(D): O POS
Antibody Screen: NEGATIVE

## 2014-12-16 LAB — SURGICAL PCR SCREEN
MRSA, PCR: NEGATIVE
Staphylococcus aureus: NEGATIVE

## 2014-12-16 LAB — ABO/RH: ABO/RH(D): O POS

## 2014-12-16 SURGERY — POSTERIOR LUMBAR FUSION 3 LEVEL
Anesthesia: General | Site: Back

## 2014-12-16 MED ORDER — FENTANYL CITRATE (PF) 250 MCG/5ML IJ SOLN
INTRAMUSCULAR | Status: DC | PRN
  Administered 2014-12-16 (×3): 50 ug via INTRAVENOUS
  Administered 2014-12-16: 100 ug via INTRAVENOUS

## 2014-12-16 MED ORDER — BISACODYL 5 MG PO TBEC: 5.0000 mg | DELAYED_RELEASE_TABLET | Freq: Every day | ORAL | Status: DC | PRN

## 2014-12-16 MED ORDER — MORPHINE SULFATE 2 MG/ML IJ SOLN
1.0000 mg | INTRAMUSCULAR | Status: DC | PRN
  Administered 2014-12-16 – 2014-12-17 (×2): 4 mg via INTRAVENOUS
  Filled 2014-12-16 (×2): qty 2

## 2014-12-16 MED ORDER — MIDAZOLAM HCL 2 MG/2ML IJ SOLN
INTRAMUSCULAR | Status: AC
  Filled 2014-12-16: qty 2

## 2014-12-16 MED ORDER — PHENOL 1.4 % MT LIQD: 1.0000 | OROMUCOSAL | Status: DC | PRN

## 2014-12-16 MED ORDER — PHENYLEPHRINE HCL 10 MG/ML IJ SOLN
10.0000 mg | INTRAVENOUS | Status: DC | PRN
  Administered 2014-12-16: 20 ug/min via INTRAVENOUS

## 2014-12-16 MED ORDER — MENTHOL 3 MG MT LOZG: 1.0000 | LOZENGE | OROMUCOSAL | Status: DC | PRN

## 2014-12-16 MED ORDER — HYDROMORPHONE HCL 1 MG/ML IJ SOLN: 0.2500 mg | INTRAMUSCULAR | Status: DC | PRN

## 2014-12-16 MED ORDER — SODIUM CHLORIDE 0.9 % IV SOLN: INTRAVENOUS | Status: DC | PRN

## 2014-12-16 MED ORDER — ROCURONIUM BROMIDE 50 MG/5ML IV SOLN
INTRAVENOUS | Status: AC
  Filled 2014-12-16: qty 1

## 2014-12-16 MED ORDER — OXYCODONE-ACETAMINOPHEN 5-325 MG PO TABS
1.0000 | ORAL_TABLET | ORAL | Status: DC | PRN
  Administered 2014-12-16: 2 via ORAL
  Administered 2014-12-17 (×2): 1 via ORAL
  Administered 2014-12-17: 2 via ORAL
  Administered 2014-12-17: 1 via ORAL
  Administered 2014-12-17: 2 via ORAL
  Administered 2014-12-18: 1 via ORAL
  Administered 2014-12-18 (×2): 2 via ORAL
  Administered 2014-12-18: 1 via ORAL
  Administered 2014-12-18 – 2014-12-19 (×5): 2 via ORAL
  Administered 2014-12-19 – 2014-12-20 (×2): 1 via ORAL
  Administered 2014-12-20 (×2): 2 via ORAL
  Administered 2014-12-20: 1 via ORAL
  Administered 2014-12-20 – 2014-12-21 (×3): 2 via ORAL
  Filled 2014-12-16 (×2): qty 2
  Filled 2014-12-16: qty 1
  Filled 2014-12-16: qty 2
  Filled 2014-12-16: qty 1
  Filled 2014-12-16: qty 2
  Filled 2014-12-16 (×2): qty 1
  Filled 2014-12-16 (×2): qty 2
  Filled 2014-12-16 (×2): qty 1
  Filled 2014-12-16 (×6): qty 2
  Filled 2014-12-16: qty 1
  Filled 2014-12-16 (×5): qty 2

## 2014-12-16 MED ORDER — DEXAMETHASONE SODIUM PHOSPHATE 10 MG/ML IJ SOLN: 10.0000 mg | INTRAMUSCULAR | Status: DC

## 2014-12-16 MED ORDER — MAGNESIUM CITRATE PO SOLN
1.0000 | Freq: Once | ORAL | Status: AC | PRN
Start: 2014-12-16 — End: 2014-12-16
  Filled 2014-12-16: qty 296

## 2014-12-16 MED ORDER — BUPIVACAINE HCL (PF) 0.25 % IJ SOLN
INTRAMUSCULAR | Status: DC | PRN
  Administered 2014-12-16: 10 mL

## 2014-12-16 MED ORDER — ONDANSETRON HCL 4 MG/2ML IJ SOLN
INTRAMUSCULAR | Status: DC | PRN
  Administered 2014-12-16: 4 mg via INTRAVENOUS

## 2014-12-16 MED ORDER — NEOSTIGMINE METHYLSULFATE 10 MG/10ML IV SOLN
INTRAVENOUS | Status: DC | PRN
  Administered 2014-12-16: 4 mg via INTRAVENOUS

## 2014-12-16 MED ORDER — SODIUM CHLORIDE 0.9 % IJ SOLN
3.0000 mL | Freq: Two times a day (BID) | INTRAMUSCULAR | Status: DC
  Administered 2014-12-16 – 2014-12-20 (×7): 3 mL via INTRAVENOUS

## 2014-12-16 MED ORDER — LISINOPRIL 20 MG PO TABS: 20.0000 mg | ORAL_TABLET | Freq: Every day | ORAL | Status: DC

## 2014-12-16 MED ORDER — LACTATED RINGERS IV SOLN
INTRAVENOUS | Status: DC | PRN
  Administered 2014-12-16: 12:00:00 via INTRAVENOUS

## 2014-12-16 MED ORDER — SODIUM CHLORIDE 0.9 % IV SOLN: 250.0000 mL | INTRAVENOUS | Status: DC

## 2014-12-16 MED ORDER — CEFAZOLIN SODIUM-DEXTROSE 2-3 GM-% IV SOLR
2.0000 g | INTRAVENOUS | Status: AC
  Administered 2014-12-16: 2 g via INTRAVENOUS
  Filled 2014-12-16: qty 50

## 2014-12-16 MED ORDER — THROMBIN 20000 UNITS EX SOLR
CUTANEOUS | Status: DC | PRN
  Administered 2014-12-16: 20 mL via TOPICAL

## 2014-12-16 MED ORDER — POLYETHYLENE GLYCOL 3350 17 G PO PACK: 17.0000 g | PACK | Freq: Every day | ORAL | Status: DC | PRN

## 2014-12-16 MED ORDER — ONDANSETRON HCL 4 MG/2ML IJ SOLN: 4.0000 mg | INTRAMUSCULAR | Status: DC | PRN

## 2014-12-16 MED ORDER — FENTANYL CITRATE (PF) 250 MCG/5ML IJ SOLN
INTRAMUSCULAR | Status: AC
  Filled 2014-12-16: qty 5

## 2014-12-16 MED ORDER — LACTATED RINGERS IV SOLN
INTRAVENOUS | Status: DC | PRN
  Administered 2014-12-16: 11:00:00 via INTRAVENOUS

## 2014-12-16 MED ORDER — MEPERIDINE HCL 25 MG/ML IJ SOLN: 6.2500 mg | INTRAMUSCULAR | Status: DC | PRN

## 2014-12-16 MED ORDER — DEXAMETHASONE SODIUM PHOSPHATE 10 MG/ML IJ SOLN
INTRAMUSCULAR | Status: DC | PRN
  Administered 2014-12-16: 10 mg via INTRAVENOUS

## 2014-12-16 MED ORDER — ROCURONIUM BROMIDE 100 MG/10ML IV SOLN
INTRAVENOUS | Status: DC | PRN
  Administered 2014-12-16: 50 mg via INTRAVENOUS
  Administered 2014-12-16: 10 mg via INTRAVENOUS
  Administered 2014-12-16: 5 mg via INTRAVENOUS
  Administered 2014-12-16: 20 mg via INTRAVENOUS
  Administered 2014-12-16: 5 mg via INTRAVENOUS
  Administered 2014-12-16: 10 mg via INTRAVENOUS

## 2014-12-16 MED ORDER — PHENYLEPHRINE HCL 10 MG/ML IJ SOLN
INTRAMUSCULAR | Status: AC
  Filled 2014-12-16: qty 1

## 2014-12-16 MED ORDER — SODIUM CHLORIDE 0.9 % IR SOLN
Status: DC | PRN
  Administered 2014-12-16: 500 mL

## 2014-12-16 MED ORDER — ACETAMINOPHEN 650 MG RE SUPP: 650.0000 mg | RECTAL | Status: DC | PRN

## 2014-12-16 MED ORDER — MIDAZOLAM HCL 5 MG/5ML IJ SOLN
INTRAMUSCULAR | Status: DC | PRN
  Administered 2014-12-16: 2 mg via INTRAVENOUS

## 2014-12-16 MED ORDER — ONDANSETRON HCL 4 MG/2ML IJ SOLN
INTRAMUSCULAR | Status: AC
  Filled 2014-12-16: qty 2

## 2014-12-16 MED ORDER — LEVOTHYROXINE SODIUM 25 MCG PO TABS
25.0000 ug | ORAL_TABLET | Freq: Every day | ORAL | Status: DC
  Administered 2014-12-17 – 2014-12-21 (×5): 25 ug via ORAL
  Filled 2014-12-16 (×5): qty 1

## 2014-12-16 MED ORDER — POTASSIUM CHLORIDE IN NACL 20-0.9 MEQ/L-% IV SOLN
INTRAVENOUS | Status: DC
  Administered 2014-12-17: 05:00:00 via INTRAVENOUS
  Filled 2014-12-16: qty 1000

## 2014-12-16 MED ORDER — CEFAZOLIN SODIUM-DEXTROSE 2-3 GM-% IV SOLR
INTRAVENOUS | Status: AC
  Filled 2014-12-16: qty 50

## 2014-12-16 MED ORDER — CEFAZOLIN SODIUM 1-5 GM-% IV SOLN
1.0000 g | Freq: Three times a day (TID) | INTRAVENOUS | Status: AC
  Administered 2014-12-16 – 2014-12-17 (×2): 1 g via INTRAVENOUS
  Filled 2014-12-16 (×2): qty 50

## 2014-12-16 MED ORDER — DEXAMETHASONE SODIUM PHOSPHATE 10 MG/ML IJ SOLN
INTRAMUSCULAR | Status: AC
  Filled 2014-12-16: qty 1

## 2014-12-16 MED ORDER — 0.9 % SODIUM CHLORIDE (POUR BTL) OPTIME
TOPICAL | Status: DC | PRN
  Administered 2014-12-16: 1000 mL

## 2014-12-16 MED ORDER — PROPOFOL 10 MG/ML IV BOLUS
INTRAVENOUS | Status: DC | PRN
  Administered 2014-12-16: 20 mg via INTRAVENOUS
  Administered 2014-12-16: 130 mg via INTRAVENOUS

## 2014-12-16 MED ORDER — GLYCOPYRROLATE 0.2 MG/ML IJ SOLN
INTRAMUSCULAR | Status: DC | PRN
  Administered 2014-12-16: .8 mg via INTRAVENOUS

## 2014-12-16 MED ORDER — THROMBIN 5000 UNITS EX SOLR
OROMUCOSAL | Status: DC | PRN
  Administered 2014-12-16: 13:00:00 via TOPICAL

## 2014-12-16 MED ORDER — SODIUM CHLORIDE 0.9 % IJ SOLN: 3.0000 mL | INTRAMUSCULAR | Status: DC | PRN

## 2014-12-16 MED ORDER — ACETAMINOPHEN 325 MG PO TABS: 650.0000 mg | ORAL_TABLET | ORAL | Status: DC | PRN

## 2014-12-16 MED ORDER — LIDOCAINE HCL (CARDIAC) 20 MG/ML IV SOLN
INTRAVENOUS | Status: DC | PRN
  Administered 2014-12-16: 100 mg via INTRAVENOUS

## 2014-12-16 MED ORDER — VANCOMYCIN HCL 1000 MG IV SOLR
INTRAVENOUS | Status: DC | PRN
  Administered 2014-12-16: 1000 mg via TOPICAL

## 2014-12-16 MED ORDER — PROMETHAZINE HCL 25 MG/ML IJ SOLN: 6.2500 mg | INTRAMUSCULAR | Status: DC | PRN

## 2014-12-16 SURGICAL SUPPLY — 59 items
ADH SKN CLS APL DERMABOND .7 (GAUZE/BANDAGES/DRESSINGS) ×1
APL SKNCLS STERI-STRIP NONHPOA (GAUZE/BANDAGES/DRESSINGS) ×1
BAG DECANTER FOR FLEXI CONT (MISCELLANEOUS) ×2 IMPLANT
BENZOIN TINCTURE PRP APPL 2/3 (GAUZE/BANDAGES/DRESSINGS) ×2 IMPLANT
BLADE CLIPPER SURG (BLADE) IMPLANT
BONE CANC CHIPS 20CC PCAN1/4 (Bone Implant) ×2 IMPLANT
BONE MATRIX OSTEOCEL PRO MED (Bone Implant) ×4 IMPLANT
BUR MATCHSTICK NEURO 3.0 LAGG (BURR) ×2 IMPLANT
CANISTER SUCT 3000ML PPV (MISCELLANEOUS) ×2 IMPLANT
CHIPS CANC BONE 20CC PCAN1/4 (Bone Implant) ×1 IMPLANT
CONT SPEC 4OZ CLIKSEAL STRL BL (MISCELLANEOUS) ×2 IMPLANT
COVER BACK TABLE 60X90IN (DRAPES) ×2 IMPLANT
DERMABOND ADVANCED (GAUZE/BANDAGES/DRESSINGS) ×1
DERMABOND ADVANCED .7 DNX12 (GAUZE/BANDAGES/DRESSINGS) ×1 IMPLANT
DIGITIZER BENDINI (MISCELLANEOUS) ×2 IMPLANT
DRAPE C-ARM 42X72 X-RAY (DRAPES) ×4 IMPLANT
DRAPE LAPAROTOMY 100X72X124 (DRAPES) ×2 IMPLANT
DRAPE POUCH INSTRU U-SHP 10X18 (DRAPES) ×2 IMPLANT
DRAPE SURG 17X23 STRL (DRAPES) ×2 IMPLANT
DRSG OPSITE 4X5.5 SM (GAUZE/BANDAGES/DRESSINGS) IMPLANT
DRSG OPSITE POSTOP 4X10 (GAUZE/BANDAGES/DRESSINGS) ×2 IMPLANT
DRSG OPSITE POSTOP 4X6 (GAUZE/BANDAGES/DRESSINGS) ×4 IMPLANT
DRSG TELFA 3X8 NADH (GAUZE/BANDAGES/DRESSINGS) IMPLANT
DURAPREP 26ML APPLICATOR (WOUND CARE) ×2 IMPLANT
ELECT REM PT RETURN 9FT ADLT (ELECTROSURGICAL) ×2
ELECTRODE REM PT RTRN 9FT ADLT (ELECTROSURGICAL) ×1 IMPLANT
EVACUATOR 1/8 PVC DRAIN (DRAIN) IMPLANT
GAUZE SPONGE 4X4 16PLY XRAY LF (GAUZE/BANDAGES/DRESSINGS) IMPLANT
GLOVE BIO SURGEON STRL SZ8 (GLOVE) ×4 IMPLANT
GOWN STRL REUS W/ TWL LRG LVL3 (GOWN DISPOSABLE) ×1 IMPLANT
GOWN STRL REUS W/ TWL XL LVL3 (GOWN DISPOSABLE) ×2 IMPLANT
GOWN STRL REUS W/TWL 2XL LVL3 (GOWN DISPOSABLE) IMPLANT
GOWN STRL REUS W/TWL LRG LVL3 (GOWN DISPOSABLE) ×2
GOWN STRL REUS W/TWL XL LVL3 (GOWN DISPOSABLE) ×4
HEMOSTAT POWDER SURGIFOAM 1G (HEMOSTASIS) ×2 IMPLANT
KIT BASIN OR (CUSTOM PROCEDURE TRAY) ×2 IMPLANT
KIT ROOM TURNOVER OR (KITS) ×2 IMPLANT
NEEDLE HYPO 25X1 1.5 SAFETY (NEEDLE) ×2 IMPLANT
NS IRRIG 1000ML POUR BTL (IV SOLUTION) ×2 IMPLANT
PACK LAMINECTOMY NEURO (CUSTOM PROCEDURE TRAY) ×2 IMPLANT
PAD ARMBOARD 7.5X6 YLW CONV (MISCELLANEOUS) ×6 IMPLANT
ROD RELINE-O 5.5X300 STRT (Rod) ×2 IMPLANT
ROD RELINE-O 5.5X300MM STRT (Rod) ×4 IMPLANT
SCREW LOCK RELINE 5.5 TULIP (Screw) ×28 IMPLANT
SCREW RELINE-O POLY 5.5X45MM (Screw) ×14 IMPLANT
SCREW RELINE-O POLY 6.5X45 (Screw) ×14 IMPLANT
SPONGE LAP 4X18 X RAY DECT (DISPOSABLE) IMPLANT
SPONGE SURGIFOAM ABS GEL 100 (HEMOSTASIS) ×2 IMPLANT
STRIP CLOSURE SKIN 1/2X4 (GAUZE/BANDAGES/DRESSINGS) ×2 IMPLANT
SUT VIC AB 0 CT1 18XCR BRD8 (SUTURE) ×2 IMPLANT
SUT VIC AB 0 CT1 8-18 (SUTURE) ×2
SUT VIC AB 2-0 CP2 18 (SUTURE) ×4 IMPLANT
SUT VIC AB 3-0 SH 8-18 (SUTURE) ×8 IMPLANT
SYR 20ML ECCENTRIC (SYRINGE) IMPLANT
TOWEL OR 17X24 6PK STRL BLUE (TOWEL DISPOSABLE) ×2 IMPLANT
TOWEL OR 17X26 10 PK STRL BLUE (TOWEL DISPOSABLE) ×2 IMPLANT
TRAP SPECIMEN MUCOUS 40CC (MISCELLANEOUS) ×2 IMPLANT
TRAY FOLEY W/METER SILVER 14FR (SET/KITS/TRAYS/PACK) IMPLANT
WATER STERILE IRR 1000ML POUR (IV SOLUTION) ×2 IMPLANT

## 2014-12-16 NOTE — Anesthesia Postprocedure Evaluation (Signed)
  Anesthesia Post-op Note  Patient: Devin [name]  Procedure(s) Performed: Procedure(s): T/10 - L/4 Fusion w/instrumentation (N/A)  Patient Location: PACU  Anesthesia Type:General  Level of Consciousness: awake and sedated  Airway and Oxygen Therapy: Patient Spontanous Breathing and Patient connected to nasal cannula oxygen  Post-op Pain: mild  Post-op Assessment: Post-op Vital signs reviewed, Patient's Cardiovascular Status Stable, Respiratory Function Stable, Patent Airway and No signs of Nausea or vomiting LLE Motor Response: Purposeful movement, Responds to commands (wiggle toes)   RLE Motor Response: Purposeful movement, Responds to commands (wiggle toes)        Post-op Vital Signs: Reviewed and stable  Last Vitals:  Filed Vitals:   12/16/14 1519  BP:   Pulse: 91  Temp:   Resp: 22    Complications: No apparent anesthesia complications

## 2014-12-16 NOTE — Anesthesia Procedure Notes (Signed)
Procedure Name: Intubation Date/Time: 12/16/2014 10:52 AM Performed by: Ignacia Bayley Pre-anesthesia Checklist: Patient identified, Emergency Drugs available, Suction available and Patient being monitored Patient Re-evaluated:Patient Re-evaluated prior to inductionOxygen Delivery Method: Circle system utilized Preoxygenation: Pre-oxygenation with 100% oxygen Intubation Type: IV induction Ventilation: Mask ventilation without difficulty Laryngoscope Size: Mac and 3 Grade View: Grade II Tube type: Oral Number of attempts: 2 Airway Equipment and Method: Stylet Placement Confirmation: ETT inserted through vocal cords under direct vision,  positive ETCO2 and breath sounds checked- equal and bilateral Secured at: 23 cm Tube secured with: Tape Dental Injury: Teeth and Oropharynx as per pre-operative assessment

## 2014-12-16 NOTE — Transfer of Care (Signed)
Immediate Anesthesia Transfer of Care Note  Patient: Devin [name]  Procedure(s) Performed: Procedure(s): T/10 - L/4 Fusion w/instrumentation (N/A)  Patient Location: PACU  Anesthesia Type:General  Level of Consciousness: sedated  Airway & Oxygen Therapy: Patient Spontanous Breathing and Patient connected to nasal cannula oxygen  Post-op Assessment: Report given to RN and Post -op Vital signs reviewed and stable  Post vital signs: stable  Last Vitals:  Filed Vitals:   12/16/14 1448  BP: 145/68  Pulse: 87  Temp: 36.9 C  Resp: 17    Complications: No apparent anesthesia complications

## 2014-12-16 NOTE — Care Management (Signed)
Important Message  Patient Details  Name: Devin [name] MRN: [id number] Date of Birth: 1941/02/08   Medicare Important Message Given:  Yes-second notification given    Rolm Baptise, RN 12/16/2014, 11:01 AM
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
## 2014-12-17 LAB — GLUCOSE, CAPILLARY
Glucose-Capillary: 194 mg/dL — ABNORMAL HIGH (ref 65–99)
Glucose-Capillary: 214 mg/dL — ABNORMAL HIGH (ref 65–99)
Glucose-Capillary: 306 mg/dL — ABNORMAL HIGH (ref 65–99)
Glucose-Capillary: 202 mg/dL — ABNORMAL HIGH (ref 65–99)

## 2014-12-17 NOTE — Progress Notes (Signed)
Patient needs face to face filled out, sticky noted left for MD and Mahala Menghini, patient's Nurse informed.

## 2014-12-17 NOTE — Progress Notes (Signed)
Foley removed. DTV by noon today 12/17/14.

## 2014-12-17 NOTE — Progress Notes (Signed)
CM spoke with patient in his room about his home health needs.  Elected [place] for PT and DME: Rolling waler and 3in1.  CM called Tiffany of [facility] at [phone] to arrange HHPT and called Jeneen Rinks of [facility] for DME arrangement.  Patient says he has friends and neighbors in and out to support and assist him as needed.

## 2014-12-17 NOTE — Progress Notes (Signed)
Patient ID: Devin [name], male   DOB: 1941/02/17, 74 y.o.   MRN: [id number] Doing well, minimal incisional pain. No weakness. PT to help
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
## 2014-12-17 NOTE — Care Management Note (Signed)
Case Management Note  Patient Details  Name: Devin [name] MRN: [id number] Date of Birth: Dec 01, 1940  Subjective/Objective:       s/p ORIF of L1 12-16-14.             Action/Plan: Home Health  Expected Discharge Date:       12/18/14           Expected Discharge Plan:  [place] (Lives at home with spouse)  In-House Referral:     Discharge planning Services  CM Consult  Post Acute Care Choice:    Choice offered to:  Patient  DME Arranged:  3-N-1, Walker rolling DME Agency:  [place]:  PT [place]:  [place]  Status of Service:  In process, will continue to follow  Medicare Important Message Given:  Yes-second notification given Date Medicare IM Given:    Medicare IM give by:    Date Additional Medicare IM Given:    Additional Medicare Important Message give by:     If discussed at [place] of Stay Meetings, dates discussed:    Additional Comments:  Dimas Aguas, RN 12/17/2014, 1:32 PM

## 2014-12-18 LAB — GLUCOSE, CAPILLARY
Glucose-Capillary: 217 mg/dL — ABNORMAL HIGH (ref 65–99)
Glucose-Capillary: 240 mg/dL — ABNORMAL HIGH (ref 65–99)
Glucose-Capillary: 232 mg/dL — ABNORMAL HIGH (ref 65–99)
Glucose-Capillary: 227 mg/dL — ABNORMAL HIGH (ref 65–99)

## 2014-12-18 NOTE — Progress Notes (Addendum)
Upon rounding, patient wife noted very upset regarding to his disposition that he doesn't have 24 hours care and worry that he might have a "blow out" if pt doesn't get out of bed fast enough (due to hx of crohns) and that they have a kingsized bed. She wants to know what can she do to prevent her mattresses from being soiled. Per pt, BSC and walker already at home. RN verbalized that if patient  has no help at home, they may need to consider rehab for a while. Patient appears to be able to ambulate with minimal assistance and wishes to go home. RN given resources such as water proof bed cover and adult depends, BSC close by bed ect...  Patient wife appears increasing upset stating 'I don't know where to get these things". RN provided several retail places with ads available. Will continue to monitor.   Ave Filter, RN

## 2014-12-18 NOTE — Progress Notes (Signed)
Patient ID: Devin [name], male   DOB: 1941-01-18, 74 y.o.   MRN: [id number] BP 129/62 mmHg  Pulse 81  Temp(Src) 98.4 F (36.9 C) (Oral)  Resp 20  Ht 6' 2"  (1.88 m)  Wt 107.14 kg (236 lb 3.2 oz)  BMI 30.31 kg/m2  SpO2 96% Alert and oriented x 4, speech is clear and fluent Moving lower extremities well Wound is clean, dry, without signs of infection Continue pt

## 2014-12-18 NOTE — Progress Notes (Signed)
OT Cancellation Note  Patient Details Name: Devin [name] MRN: [id number] DOB: Sep 09, 1940   Cancelled Treatment:    Reason Eval/Treat Not Completed: Pain limiting ability to participate, pt. Reports he "just got comfortable" and declines therapy at this time.  He and his spouse did have several questions though regarding toileting and bed mobility but still continuing to decline therapy.  Will check back later today or tom. A.m. As able.   Janice Coffin, COTA/L 12/18/2014, 8:57 AM
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
## 2014-12-19 LAB — GLUCOSE, CAPILLARY
Glucose-Capillary: 272 mg/dL — ABNORMAL HIGH (ref 65–99)
Glucose-Capillary: 231 mg/dL — ABNORMAL HIGH (ref 65–99)
Glucose-Capillary: 246 mg/dL — ABNORMAL HIGH (ref 65–99)
Glucose-Capillary: 210 mg/dL — ABNORMAL HIGH (ref 65–99)
Glucose-Capillary: 227 mg/dL — ABNORMAL HIGH (ref 65–99)

## 2014-12-19 MED ORDER — ALUM & MAG HYDROXIDE-SIMETH 200-200-20 MG/5ML PO SUSP
30.0000 mL | ORAL | Status: DC | PRN
  Administered 2014-12-19: 30 mL via ORAL
  Filled 2014-12-19: qty 30

## 2014-12-19 NOTE — Progress Notes (Signed)
Met with patient and wife to discuss discharge planning. Orders were received to add a hospital bed to the existing DME orders.  Jermaine with [facility] was notified of hospital bed order. Patient's preferred contact number is [phone].  Address is [place], [place] [address]
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
## 2014-12-19 NOTE — Progress Notes (Signed)
Chaplain initiated visit with pt and family. Pt, "Devin [name]", reports that he is being supported spiritually by his pastor. But appreciates chaplain visit. Pt reports that his care has been good, but he wants to go home soon. Chaplain informed pt and family of her services as needed and offered to keep family in prayer. Page chaplain as needed.    12/19/14 0900  Clinical Encounter Type  Visited With Patient and family together  Visit Type Initial;Spiritual support  Referral From Nurse  Spiritual Encounters  Spiritual Needs Emotional;Prayer  Devin [name] 12/19/2014 9:47 AM
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
## 2014-12-19 NOTE — Progress Notes (Signed)
Patient with foley catheter for urinary retention. Foley removed per protocol. PT is DTV. Encourage oral fluid. Will continue to monitor.  Ave Filter, RN
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
## 2014-12-19 NOTE — Care Management (Signed)
Important Message  Patient Details  Name: Devin [name] MRN: [id number] Date of Birth: August 11, 1940   Medicare Important Message Given:  Yes-second notification given    Delorse Lek 12/19/2014, 3:25 PM

## 2014-12-19 NOTE — Progress Notes (Signed)
Subjective: Patient reports he's doig well, some nausea, walking ok, pain not bad  Objective: Vital signs in last 24 hours: Temp:  [98 F (36.7 C)-99.2 F (37.3 C)] 98.9 F (37.2 C) (07/11 0637) Pulse Rate:  [72-88] 77 (07/11 0637) Resp:  [18-20] 18 (07/11 0637) BP: (118-160)/(48-100) 160/68 mmHg (07/11 0637) SpO2:  [95 %-98 %] 95 % (07/11 0637)  Intake/Output from previous day: 07/10 0730 - 07/11 0729 In: 603 [P.O.:600; I.V.:3] Out: 2200 [Urine:2200] Intake/Output this shift:    Neurologic: Grossly normal  Lab Results: Lab Results  Component Value Date   WBC 8.5 12/16/2014   HGB 12.2* 12/16/2014   HCT 35.7* 12/16/2014   MCV 92.7 12/16/2014   PLT 202 12/16/2014   Lab Results  Component Value Date   INR 1.19 12/16/2014   BMET Lab Results  Component Value Date   NA 138 12/16/2014   K 4.0 12/16/2014   CL 107 12/16/2014   CO2 24 12/16/2014   GLUCOSE 146* 12/16/2014   BUN 13 12/16/2014   CREATININE 1.15 12/16/2014   CALCIUM 8.6* 12/16/2014    Studies/Results: No results found.  Assessment/Plan: Doing well, some nausea, no emesis, maybe home in a day or two   LOS: 5 days    Kimberl Vig S 12/19/2014, 7:54 AM

## 2014-12-20 ENCOUNTER — Telehealth: Payer: Self-pay | Admitting: Endocrinology

## 2014-12-20 ENCOUNTER — Encounter (HOSPITAL_COMMUNITY): Payer: Self-pay | Admitting: Neurological Surgery

## 2014-12-20 LAB — GLUCOSE, CAPILLARY
Glucose-Capillary: 205 mg/dL — ABNORMAL HIGH (ref 65–99)
Glucose-Capillary: 264 mg/dL — ABNORMAL HIGH (ref 65–99)
Glucose-Capillary: 203 mg/dL — ABNORMAL HIGH (ref 65–99)
Glucose-Capillary: 335 mg/dL — ABNORMAL HIGH (ref 65–99)

## 2014-12-20 MED FILL — Sodium Chloride IV Soln 0.9%: INTRAVENOUS | Qty: 1000 | Status: AC

## 2014-12-20 MED FILL — Heparin Sodium (Porcine) Inj 1000 Unit/ML: INTRAMUSCULAR | Qty: 30 | Status: AC

## 2014-12-20 NOTE — Telephone Encounter (Signed)
Ok, just come in when you can.  i reviewed the x-ray results, and i would not lose sleep over this.

## 2014-12-20 NOTE — Telephone Encounter (Signed)
Devin [name] this is your patient  Thanks

## 2014-12-20 NOTE — Telephone Encounter (Signed)
Pt is at [facility] , and they found a nodule on his lung , pt will be coming home from hospital tomorrow , please call and adivise on what to do about nodule for Devin [name] to treat please call [phone] or [name] his wife)
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
## 2014-12-20 NOTE — Telephone Encounter (Signed)
Left voicemail advising of note below for pt's wife. Requested call back if she would like to discuss.

## 2014-12-20 NOTE — Progress Notes (Signed)
Subjective: Patient reports he's doing well, has BM, peeing well, back pain better, good strength, mobilizing better  Objective: Vital signs in last 24 hours: Temp:  [98.2 F (36.8 C)-99.3 F (37.4 C)] 98.8 F (37.1 C) (07/12 0620) Pulse Rate:  [67-80] 73 (07/12 0620) Resp:  [18] 18 (07/12 0620) BP: (116-152)/(41-74) 116/64 mmHg (07/12 0620) SpO2:  [92 %-99 %] 97 % (07/12 0620)  Intake/Output from previous day: 07/11 0730 - 07/12 0729 In: 480 [P.O.:480] Out: 600 [Urine:600] Intake/Output this shift:    Neurologic: Grossly normal  Lab Results: Lab Results  Component Value Date   WBC 8.5 12/16/2014   HGB 12.2* 12/16/2014   HCT 35.7* 12/16/2014   MCV 92.7 12/16/2014   PLT 202 12/16/2014   Lab Results  Component Value Date   INR 1.19 12/16/2014   BMET Lab Results  Component Value Date   NA 138 12/16/2014   K 4.0 12/16/2014   CL 107 12/16/2014   CO2 24 12/16/2014   GLUCOSE 146* 12/16/2014   BUN 13 12/16/2014   CREATININE 1.15 12/16/2014   CALCIUM 8.6* 12/16/2014    Studies/Results: No results found.  Assessment/Plan:arrangemants made for dc home tomorrow   LOS: 6 days    Devin [name] S 12/20/2014, 8:41 AM

## 2014-12-20 NOTE — Telephone Encounter (Signed)
F/u ov thurs afternoon

## 2014-12-20 NOTE — Telephone Encounter (Signed)
I tried to contact the pt. Both numbers listed were busy. Will try again at a later time.

## 2014-12-20 NOTE — Telephone Encounter (Signed)
See note below and please advise, Thanks! 

## 2014-12-21 LAB — GLUCOSE, CAPILLARY
Glucose-Capillary: 279 mg/dL — ABNORMAL HIGH (ref 65–99)
Glucose-Capillary: 258 mg/dL — ABNORMAL HIGH (ref 65–99)

## 2014-12-21 MED ORDER — OXYCODONE-ACETAMINOPHEN 5-325 MG PO TABS: 1.0000 | ORAL_TABLET | Freq: Four times a day (QID) | ORAL | Status: DC | PRN

## 2014-12-21 NOTE — Discharge Summary (Signed)
Physician Discharge Summary  Patient ID: Devin [name] MRN: [id number] DOB/AGE: 1941-01-02 74 y.o.  Admit date: 12/14/2014 Discharge date: 12/21/2014  Admission Diagnoses: L1 fracture    Discharge Diagnoses: same   Discharged Condition: good  Hospital Course: The patient was admitted on 12/14/2014 and taken to the operating room where the patient underwent ORIF L1 fracture. The patient tolerated the procedure well and was taken to the recovery room and then to the floor in stable condition. The hospital course was routine. There were no complications. The wound remained clean dry and intact. Pt had appropriate back soreness. No complaints of leg pain or new N/T/W. The patient remained afebrile with stable vital signs, and tolerated a regular diet. The patient continued to increase activities, and pain was well controlled with oral pain medications.   Consults: None  Significant Diagnostic Studies:  Results for orders placed or performed during the hospital encounter of 12/14/14  Surgical pcr screen  Result Value Ref Range   MRSA, PCR NEGATIVE NEGATIVE   Staphylococcus aureus NEGATIVE NEGATIVE  CBC  Result Value Ref Range   WBC 8.7 4.0 - 10.5 K/uL   RBC 3.77 (L) 4.22 - 5.81 MIL/uL   Hemoglobin 11.8 (L) 13.0 - 17.0 g/dL   HCT 35.4 (L) 39.0 - 52.0 %   MCV 93.9 78.0 - 100.0 fL   MCH 31.3 26.0 - 34.0 pg   MCHC 33.3 30.0 - 36.0 g/dL   RDW 12.7 11.5 - 15.5 %   Platelets 193 150 - 400 K/uL  Basic Metabolic Panel  Result Value Ref Range   Sodium 138 135 - 145 mmol/L   Potassium 4.3 3.5 - 5.1 mmol/L   Chloride 106 101 - 111 mmol/L   CO2 25 22 - 32 mmol/L   Glucose, Bld 134 (H) 65 - 99 mg/dL   BUN 16 6 - 20 mg/dL   Creatinine, Ser 1.29 (H) 0.61 - 1.24 mg/dL   Calcium 8.3 (L) 8.9 - 10.3 mg/dL   GFR calc non Af Amer 54 (L) >60 mL/min   GFR calc Af Amer >60 >60 mL/min   Anion gap 7 5 - 15  Protime-INR  Result Value Ref Range   Prothrombin Time 15.2 11.6 - 15.2 seconds   INR 1.18  0.00 - 1.49  Glucose, capillary  Result Value Ref Range   Glucose-Capillary 124 (H) 65 - 99 mg/dL   Comment 1 Notify RN    Comment 2 Document in Chart   Glucose, capillary  Result Value Ref Range   Glucose-Capillary 150 (H) 65 - 99 mg/dL  Glucose, capillary  Result Value Ref Range   Glucose-Capillary 168 (H) 65 - 99 mg/dL  Glucose, capillary  Result Value Ref Range   Glucose-Capillary 127 (H) 65 - 99 mg/dL   Comment 1 Notify RN    Comment 2 Document in Chart   Glucose, capillary  Result Value Ref Range   Glucose-Capillary 135 (H) 65 - 99 mg/dL   Comment 1 Notify RN    Comment 2 Document in Chart   Basic metabolic panel  Result Value Ref Range   Sodium 138 135 - 145 mmol/L   Potassium 4.0 3.5 - 5.1 mmol/L   Chloride 107 101 - 111 mmol/L   CO2 24 22 - 32 mmol/L   Glucose, Bld 146 (H) 65 - 99 mg/dL   BUN 13 6 - 20 mg/dL   Creatinine, Ser 1.15 0.61 - 1.24 mg/dL   Calcium 8.6 (L) 8.9 - 10.3 mg/dL  GFR calc non Af Amer >60 >60 mL/min   GFR calc Af Amer >60 >60 mL/min   Anion gap 7 5 - 15  CBC WITH DIFFERENTIAL  Result Value Ref Range   WBC 8.5 4.0 - 10.5 K/uL   RBC 3.85 (L) 4.22 - 5.81 MIL/uL   Hemoglobin 12.2 (L) 13.0 - 17.0 g/dL   HCT 35.7 (L) 39.0 - 52.0 %   MCV 92.7 78.0 - 100.0 fL   MCH 31.7 26.0 - 34.0 pg   MCHC 34.2 30.0 - 36.0 g/dL   RDW 12.6 11.5 - 15.5 %   Platelets 202 150 - 400 K/uL   Neutrophils Relative % 75 43 - 77 %   Neutro Abs 6.3 1.7 - 7.7 K/uL   Lymphocytes Relative 15 12 - 46 %   Lymphs Abs 1.3 0.7 - 4.0 K/uL   Monocytes Relative 7 3 - 12 %   Monocytes Absolute 0.6 0.1 - 1.0 K/uL   Eosinophils Relative 3 0 - 5 %   Eosinophils Absolute 0.3 0.0 - 0.7 K/uL   Basophils Relative 0 0 - 1 %   Basophils Absolute 0.0 0.0 - 0.1 K/uL  Protime-INR  Result Value Ref Range   Prothrombin Time 15.3 (H) 11.6 - 15.2 seconds   INR 1.19 0.00 - 1.49  Glucose, capillary  Result Value Ref Range   Glucose-Capillary 202 (H) 65 - 99 mg/dL  Glucose, capillary   Result Value Ref Range   Glucose-Capillary 194 (H) 65 - 99 mg/dL   Comment 1 Notify RN    Comment 2 Document in Chart   Glucose, capillary  Result Value Ref Range   Glucose-Capillary 214 (H) 65 - 99 mg/dL   Comment 1 Notify RN    Comment 2 Document in Chart   Glucose, capillary  Result Value Ref Range   Glucose-Capillary 306 (H) 65 - 99 mg/dL   Comment 1 Notify RN    Comment 2 Document in Chart   Glucose, capillary  Result Value Ref Range   Glucose-Capillary 202 (H) 65 - 99 mg/dL  Glucose, capillary  Result Value Ref Range   Glucose-Capillary 217 (H) 65 - 99 mg/dL   Comment 1 Notify RN    Comment 2 Document in Chart   Glucose, capillary  Result Value Ref Range   Glucose-Capillary 240 (H) 65 - 99 mg/dL  Glucose, capillary  Result Value Ref Range   Glucose-Capillary 232 (H) 65 - 99 mg/dL  Glucose, capillary  Result Value Ref Range   Glucose-Capillary 227 (H) 65 - 99 mg/dL   Comment 1 Notify RN    Comment 2 Document in Chart   Glucose, capillary  Result Value Ref Range   Glucose-Capillary 231 (H) 65 - 99 mg/dL   Comment 1 Notify RN    Comment 2 Document in Chart   Glucose, capillary  Result Value Ref Range   Glucose-Capillary 272 (H) 65 - 99 mg/dL   Comment 1 Notify RN    Comment 2 Document in Chart   Glucose, capillary  Result Value Ref Range   Glucose-Capillary 246 (H) 65 - 99 mg/dL  Glucose, capillary  Result Value Ref Range   Glucose-Capillary 210 (H) 65 - 99 mg/dL  Glucose, capillary  Result Value Ref Range   Glucose-Capillary 227 (H) 65 - 99 mg/dL   Comment 1 Notify RN    Comment 2 Document in Chart   Glucose, capillary  Result Value Ref Range   Glucose-Capillary 205 (H) 65 - 99 mg/dL  Glucose, capillary  Result Value Ref Range   Glucose-Capillary 264 (H) 65 - 99 mg/dL   Comment 1 Notify RN    Comment 2 Document in Chart   Glucose, capillary  Result Value Ref Range   Glucose-Capillary 203 (H) 65 - 99 mg/dL   Comment 1 Notify RN    Comment 2  Document in Chart   Glucose, capillary  Result Value Ref Range   Glucose-Capillary 335 (H) 65 - 99 mg/dL   Comment 1 Notify RN    Comment 2 Document in Chart   Glucose, capillary  Result Value Ref Range   Glucose-Capillary 279 (H) 65 - 99 mg/dL   Comment 1 Document in Chart   Glucose, capillary  Result Value Ref Range   Glucose-Capillary 258 (H) 65 - 99 mg/dL   Comment 1 Document in Chart   Type and screen  Result Value Ref Range   ABO/RH(D) O POS    Antibody Screen NEG    Sample Expiration 12/19/2014   ABO/Rh  Result Value Ref Range   ABO/RH(D) O POS     Chest 2 View  12/16/2014   CLINICAL DATA:  Lumbar compression fractures. Preoperative chest x-ray.  EXAM: CHEST - 2 VIEW  COMPARISON:  Two-view chest x-ray 04/01/2007.  FINDINGS: Chronic elevation of the right hemidiaphragm is again noted. The heart is mildly enlarged. Medial right basilar airspace disease is evident. Bilateral effusions are present. The compression fracture is not well visualized on these radiographs.  IMPRESSION: 1. Medial right basilar airspace disease. The component this may be chronic. Collapse is demonstrated on the CT scan. Superimposed infection or aspiration is also considered. 2. Chronic elevation of the right hemidiaphragm. 3. Bilateral effusions. 4. The T12 and L1 fractures are not well visualized on this study.   Electronically Signed   By: San Morelle M.D.   On: 12/16/2014 09:18   Dg Thoracolumabar Spine  12/16/2014   CLINICAL DATA:  T10-L4 fusion.  EXAM: DG C-ARM 61-120 MIN; THORACOLUMBAR SPINE - 2 VIEW  COMPARISON:  None.  FINDINGS: Thoracolumbar fusion. Hardware intact. 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  IMPRESSION: Thoracolumbar fusion.  Hardware intact.   Electronically Signed   By: Marcello Moores  Register   On: 12/16/2014 14:51   Ct Thoracic Spine Wo Contrast  12/15/2014   CLINICAL DATA:  T12, L1 fractures.  EXAM: CT THORACIC AND LUMBAR SPINE WITHOUT CONTRAST  TECHNIQUE:  Multidetector CT imaging of the thoracic and lumbar spine was performed without contrast. Multiplanar CT image reconstructions were also generated.  COMPARISON:  CT scan of July 04, 2011.  FINDINGS: CT THORACIC SPINE FINDINGS  Only T8-T12 are completely visualized on this study. There appears to be a nondisplaced fracture involving the anterior and inferior corner of the T12 vertebral body. No definite evidence of central spinal canal stenosis is noted.  CT LUMBAR SPINE FINDINGS  Minimal superior endplate depression is noted of L1 vertebral body secondary to fracture extending through the superior aspect of the vertebral body, which extends longitudinally into the left pedicle. There is also noted a minimally displaced fracture involving the right superior articular process of the same level. There is no posterior displacement of fracture fragments and no evidence of central spinal canal stenosis. No significant spondylolisthesis is noted. 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 Osteophytes are noted at multiple levels, particularly anteriorly, suggesting possible fusion of the lumbar spine.  Old L5 compression fracture is noted.  IMPRESSION: CT THORACIC SPINE IMPRESSION  Nondisplaced fracture involving the anterior and inferior corner the T12 vertebral body. No spondylolisthesis is noted.  Acute fracture is seen extending through the superior portion of the L1 vertebral body which extends longitudinally into the left pedicle. Minimally displaced fracture involving the right superior articular process posteriorly is also noted. No spondylolisthesis is noted.  CT LUMBAR SPINE IMPRESSION   Electronically Signed   By: Marijo Conception, M.D.   On: 12/15/2014 09:46   Ct Lumbar Spine Wo Contrast  12/15/2014   CLINICAL DATA:  T12, L1 fractures.  EXAM: CT THORACIC AND LUMBAR SPINE WITHOUT CONTRAST  TECHNIQUE:  Multidetector CT imaging of the thoracic and lumbar spine was performed without contrast. Multiplanar CT image reconstructions were also generated.  COMPARISON:  CT scan of July 04, 2011.  FINDINGS: CT THORACIC SPINE FINDINGS  Only T8-T12 are completely visualized on this study. There appears to be a nondisplaced fracture involving the anterior and inferior corner of the T12 vertebral body. No definite evidence of central spinal canal stenosis is noted.  CT LUMBAR SPINE FINDINGS  Minimal superior endplate depression is noted of L1 vertebral body secondary to fracture extending through the superior aspect of the vertebral body, which extends longitudinally into the left pedicle. There is also noted a minimally displaced fracture involving the right superior articular process of the same level. There is no posterior displacement of fracture fragments and no evidence of central spinal canal stenosis. No significant spondylolisthesis is noted. 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 Osteophytes are noted at multiple levels, particularly anteriorly, suggesting possible fusion of the lumbar spine. Old L5 compression fracture is noted.  IMPRESSION: CT THORACIC SPINE IMPRESSION  Nondisplaced fracture involving the anterior and inferior corner the T12 vertebral body. No spondylolisthesis is noted.  Acute fracture is seen extending through the superior portion of the L1 vertebral body which extends longitudinally into the left pedicle. Minimally displaced fracture involving the right superior articular process posteriorly is also noted. No spondylolisthesis is noted.  CT LUMBAR SPINE IMPRESSION   Electronically Signed   By: Marijo Conception, M.D.   On: 12/15/2014 09:46   Dg C-arm 61-120 Min  12/16/2014   CLINICAL DATA:  T10-L4 fusion.  EXAM: DG C-ARM 61-120 MIN; THORACOLUMBAR SPINE - 2 VIEW  COMPARISON:  None.   FINDINGS: Thoracolumbar fusion. Hardware intact. 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  IMPRESSION: Thoracolumbar fusion.  Hardware intact.   Electronically Signed   By: Marcello Moores  Register   On: 12/16/2014 14:51    Antibiotics:  Anti-infectives    Start     Dose/Rate Route Frequency Ordered Stop   12/16/14 1630  ceFAZolin (ANCEF) IVPB 1 g/50 mL premix     1 g 100 mL/hr over 30 Minutes Intravenous Every 8 hours 12/16/14 1613 12/17/14 0107   12/16/14 1358  vancomycin (VANCOCIN) powder  Status:  Discontinued       As needed 12/16/14 1358 12/16/14 1446   12/16/14 1145  bacitracin 50,000 Units in sodium chloride irrigation 0.9 % 500 mL irrigation  Status:  Discontinued       As needed 12/16/14 1145 12/16/14 1446   12/16/14 1019  ceFAZolin (ANCEF) 2-3 GM-% IVPB SOLR    Comments:  [employer]   : cabinet override      12/16/14 1019 12/16/14 2229  12/16/14 1000  ceFAZolin (ANCEF) IVPB 2 g/50 mL premix     2 g 100 mL/hr over 30 Minutes Intravenous To [facility] 12/16/14 0724 12/16/14 1046   12/15/14 1000  terbinafine (LAMISIL) tablet 250 mg  Status:  Discontinued     250 mg Oral Daily 12/14/14 2252 12/15/14 1336   12/14/14 2300  ceFAZolin (ANCEF) IVPB 1 g/50 mL premix     1 g 100 mL/hr over 30 Minutes Intravenous Every 8 hours 12/14/14 2252 12/15/14 0719      Discharge Exam: Blood pressure 127/61, pulse 72, temperature 98.4 F (36.9 C), temperature source Oral, resp. rate 20, height 6' 2"  (1.88 m), weight 236 lb 3.2 oz (107.14 kg), SpO2 97 %. Neurologic: Grossly normal Incision CDI  Discharge Medications:     Medication List    TAKE these medications        ASACOL HD 800 MG Tbec  Generic drug:  Mesalamine  Take 1,600 mg by mouth 2 (two) times daily.     ASCENSIA CONTOUR TEST VI  Use as directed to test two times a day     BAYER CONTOUR TEST test strip  Generic drug:  glucose blood  TEST BLOOD SUGAR TWICE DAILY AS DIRECTED     atenolol 25 MG tablet   Commonly known as:  TENORMIN  TAKE 0.5 TABLET BY MOUTH DAILY     busPIRone 15 MG tablet  Commonly known as:  BUSPAR  TAKE 1 TABLET BY MOUTH TWICE DAILY     insulin lispro 100 UNIT/ML injection  Commonly known as:  HUMALOG  before meals 3 times a day 25-25-30 units, and pen needles 4/day     insulin NPH Human 100 UNIT/ML injection  Commonly known as:  HUMULIN N PEN  Inject 20 Units into the skin at bedtime.     Insulin Pen Needle 31G X 8 MM Misc  Commonly known as:  B-D ULTRAFINE III SHORT PEN  1 each by Other route 4 (four) times daily.     B-D ULTRAFINE III SHORT PEN 31G X 8 MM Misc  Generic drug:  Insulin Pen Needle  USE FOUR TIMES DAILY AS DIRECTED     leuprolide 11.25 MG injection  Commonly known as:  LUPRON  Inject 11.25 mg into the muscle every 3 (three) months.     levothyroxine 50 MCG tablet  Commonly known as:  SYNTHROID, LEVOTHROID  Take 25 mcg by mouth daily before breakfast.     lisinopril 20 MG tablet  Commonly known as:  PRINIVIL,ZESTRIL  Take 20 mg by mouth daily.     oxyCODONE-acetaminophen 5-325 MG per tablet  Commonly known as:  PERCOCET/ROXICET  Take 1-2 tablets by mouth every 6 (six) hours as needed for moderate pain.     tadalafil 20 MG tablet  Commonly known as:  CIALIS  Take 1 tablet (20 mg total) by mouth as needed.        Disposition: home   Final Dx: T10-L4 fusion      Discharge Instructions    Call MD for:  difficulty breathing, headache or visual disturbances    Complete by:  As directed      Call MD for:  persistant nausea and vomiting    Complete by:  As directed      Call MD for:  redness, tenderness, or signs of infection (pain, swelling, redness, odor or green/yellow discharge around incision site)    Complete by:  As directed      Call MD  for:  severe uncontrolled pain    Complete by:  As directed      Call MD for:  temperature >100.4    Complete by:  As directed      Diet - low sodium heart healthy    Complete by:  As  directed      Discharge instructions    Complete by:  As directed   No driving, no heavy lifting, no bending     Increase activity slowly    Complete by:  As directed            Follow-up Information    Follow up with Aneta Hendershott S, MD. Schedule an appointment as soon as possible for a visit in 2 weeks.   Specialty:  Neurosurgery   Contact information:   [address] [place] [address] [place] [address] [phone]        Signed: Eustace Moore 12/21/2014, 2:22 PM

## 2014-12-21 NOTE — Progress Notes (Signed)
Patient discharged home with spouse. Script and discharged instructions given to patient and spouse, bothe stated understanding of instructions given.
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
## 2014-12-21 NOTE — Progress Notes (Signed)
Inpatient Diabetes Program Recommendations  AACE/ADA: New Consensus Statement on Inpatient Glycemic Control (2013)  Target Ranges:  Prepandial:   less than 140 mg/dL      Peak postprandial:   less than 180 mg/dL (1-2 hours)      Critically ill patients:  140 - 180 mg/dL  Results for Devin [name], Devin [name] (MRN [id number]) as of 12/21/2014 09:43  Ref. Range 12/20/2014 06:37 12/20/2014 11:11 12/20/2014 16:35 12/20/2014 22:08 12/21/2014 08:23  Glucose-Capillary Latest Ref Range: 65-99 mg/dL 205 (H) 264 (H) 203 (H) 335 (H) 279 (H)   Reason for Admission: L1 fracture, T10-L4 Fusion on 7/8  Diabetes history: DM 2 Outpatient Diabetes medications: Humalog 25-25-30 with meals, NPH 20 units QHS Current orders for Inpatient glycemic control: Novolog Moderate scale (0-15 units) TID  Inpatient Diabetes Program Recommendations Insulin - Basal: Patient's glucose ranges from 210-300 mg/dl while inpatient. Patient takes 20 units QHS of NPH basal insulin at home. Please consider ordering part of patient's home dose of NPH (recommend starting with 15 units).   Thanks,  Tama Headings RN, MSN, [facility] Inpatient Diabetes Coordinator Team Pager [phone]

## 2014-12-21 NOTE — Care Management Note (Signed)
Case Management Note  Patient Details  Name: Devin [name] MRN: [id number] Date of Birth: [birth year]  Subjective/Objective:                    Action/Plan: Met with patient's wife regarding discharge, per bedside RN request.  CM reintroduced self, as patient's wife stated she did not recall meeting in the past. Per patient's wife, hospital bed and all other DME have been delivered.  She is anxious for Dr Ronnald Ramp to arrive and discharge patient.  She had multiple concerns regarding obtaining pain medication scripts and keeping patient comfortable for the ride home. CM discussed the discharge process and explained that scripts would be provided at that time.  CM assured wife that bedside RN would ensure that patient was up-to-date on his pain medications at the time of discharge.  Bedside RN was updated.  Expected Discharge Date:                  Expected Discharge Plan:  [place] (Lives at home with spouse)  In-House Referral:     Discharge planning Services  CM Consult  Post Acute Care Choice:    Choice offered to:  Patient  DME Arranged:  3-N-1, Walker rolling, Hospital bed DME Agency:  [place]:  PT [place]:  [place]  Status of Service:  Completed, signed off  Medicare Important Message Given:  Yes-second notification given Date Medicare IM Given:    Medicare IM give by:    Date Additional Medicare IM Given:    Additional Medicare Important Message give by:     If discussed at [place] of Stay Meetings, dates discussed:    Additional Comments:  Rolm Baptise, RN 12/21/2014, 2:16 PM

## 2014-12-22 ENCOUNTER — Other Ambulatory Visit: Payer: Self-pay | Admitting: Endocrinology

## 2014-12-23 DIAGNOSIS — M810 Age-related osteoporosis without current pathological fracture: Secondary | ICD-10-CM | POA: Diagnosis not present

## 2014-12-23 DIAGNOSIS — S32018D Other fracture of first lumbar vertebra, subsequent encounter for fracture with routine healing: Secondary | ICD-10-CM | POA: Diagnosis not present

## 2014-12-23 DIAGNOSIS — I10 Essential (primary) hypertension: Secondary | ICD-10-CM | POA: Diagnosis not present

## 2014-12-23 DIAGNOSIS — E109 Type 1 diabetes mellitus without complications: Secondary | ICD-10-CM | POA: Diagnosis not present

## 2014-12-23 DIAGNOSIS — M456 Ankylosing spondylitis lumbar region: Secondary | ICD-10-CM | POA: Diagnosis not present

## 2014-12-23 DIAGNOSIS — Z87891 Personal history of nicotine dependence: Secondary | ICD-10-CM | POA: Diagnosis not present

## 2014-12-23 DIAGNOSIS — K5090 Crohn's disease, unspecified, without complications: Secondary | ICD-10-CM | POA: Diagnosis not present

## 2014-12-23 DIAGNOSIS — Z794 Long term (current) use of insulin: Secondary | ICD-10-CM | POA: Diagnosis not present

## 2014-12-26 ENCOUNTER — Encounter (HOSPITAL_COMMUNITY): Payer: Self-pay | Admitting: Neurological Surgery

## 2014-12-26 DIAGNOSIS — E109 Type 1 diabetes mellitus without complications: Secondary | ICD-10-CM | POA: Diagnosis not present

## 2014-12-26 DIAGNOSIS — M810 Age-related osteoporosis without current pathological fracture: Secondary | ICD-10-CM | POA: Diagnosis not present

## 2014-12-26 DIAGNOSIS — S32018D Other fracture of first lumbar vertebra, subsequent encounter for fracture with routine healing: Secondary | ICD-10-CM | POA: Diagnosis not present

## 2014-12-26 DIAGNOSIS — M456 Ankylosing spondylitis lumbar region: Secondary | ICD-10-CM | POA: Diagnosis not present

## 2014-12-26 DIAGNOSIS — I10 Essential (primary) hypertension: Secondary | ICD-10-CM | POA: Diagnosis not present

## 2014-12-26 DIAGNOSIS — K5090 Crohn's disease, unspecified, without complications: Secondary | ICD-10-CM | POA: Diagnosis not present

## 2014-12-30 DIAGNOSIS — M456 Ankylosing spondylitis lumbar region: Secondary | ICD-10-CM | POA: Diagnosis not present

## 2014-12-30 DIAGNOSIS — K5090 Crohn's disease, unspecified, without complications: Secondary | ICD-10-CM | POA: Diagnosis not present

## 2014-12-30 DIAGNOSIS — S32018D Other fracture of first lumbar vertebra, subsequent encounter for fracture with routine healing: Secondary | ICD-10-CM | POA: Diagnosis not present

## 2014-12-30 DIAGNOSIS — E109 Type 1 diabetes mellitus without complications: Secondary | ICD-10-CM | POA: Diagnosis not present

## 2014-12-30 DIAGNOSIS — I10 Essential (primary) hypertension: Secondary | ICD-10-CM | POA: Diagnosis not present

## 2014-12-30 DIAGNOSIS — M810 Age-related osteoporosis without current pathological fracture: Secondary | ICD-10-CM | POA: Diagnosis not present

## 2015-01-02 DIAGNOSIS — M456 Ankylosing spondylitis lumbar region: Secondary | ICD-10-CM | POA: Diagnosis not present

## 2015-01-02 DIAGNOSIS — M810 Age-related osteoporosis without current pathological fracture: Secondary | ICD-10-CM | POA: Diagnosis not present

## 2015-01-02 DIAGNOSIS — I10 Essential (primary) hypertension: Secondary | ICD-10-CM | POA: Diagnosis not present

## 2015-01-02 DIAGNOSIS — S32018D Other fracture of first lumbar vertebra, subsequent encounter for fracture with routine healing: Secondary | ICD-10-CM | POA: Diagnosis not present

## 2015-01-02 DIAGNOSIS — E109 Type 1 diabetes mellitus without complications: Secondary | ICD-10-CM | POA: Diagnosis not present

## 2015-01-02 DIAGNOSIS — K5090 Crohn's disease, unspecified, without complications: Secondary | ICD-10-CM | POA: Diagnosis not present

## 2015-01-04 DIAGNOSIS — M810 Age-related osteoporosis without current pathological fracture: Secondary | ICD-10-CM | POA: Diagnosis not present

## 2015-01-04 DIAGNOSIS — M456 Ankylosing spondylitis lumbar region: Secondary | ICD-10-CM | POA: Diagnosis not present

## 2015-01-04 DIAGNOSIS — S32018D Other fracture of first lumbar vertebra, subsequent encounter for fracture with routine healing: Secondary | ICD-10-CM | POA: Diagnosis not present

## 2015-01-04 DIAGNOSIS — K5090 Crohn's disease, unspecified, without complications: Secondary | ICD-10-CM | POA: Diagnosis not present

## 2015-01-04 DIAGNOSIS — I10 Essential (primary) hypertension: Secondary | ICD-10-CM | POA: Diagnosis not present

## 2015-01-04 DIAGNOSIS — E109 Type 1 diabetes mellitus without complications: Secondary | ICD-10-CM | POA: Diagnosis not present

## 2015-01-05 DIAGNOSIS — E109 Type 1 diabetes mellitus without complications: Secondary | ICD-10-CM | POA: Diagnosis not present

## 2015-01-05 DIAGNOSIS — M456 Ankylosing spondylitis lumbar region: Secondary | ICD-10-CM | POA: Diagnosis not present

## 2015-01-05 DIAGNOSIS — M810 Age-related osteoporosis without current pathological fracture: Secondary | ICD-10-CM | POA: Diagnosis not present

## 2015-01-05 DIAGNOSIS — K5090 Crohn's disease, unspecified, without complications: Secondary | ICD-10-CM | POA: Diagnosis not present

## 2015-01-05 DIAGNOSIS — S32018D Other fracture of first lumbar vertebra, subsequent encounter for fracture with routine healing: Secondary | ICD-10-CM | POA: Diagnosis not present

## 2015-01-05 DIAGNOSIS — I10 Essential (primary) hypertension: Secondary | ICD-10-CM | POA: Diagnosis not present

## 2015-01-06 DIAGNOSIS — I10 Essential (primary) hypertension: Secondary | ICD-10-CM | POA: Diagnosis not present

## 2015-01-06 DIAGNOSIS — K5090 Crohn's disease, unspecified, without complications: Secondary | ICD-10-CM | POA: Diagnosis not present

## 2015-01-06 DIAGNOSIS — M810 Age-related osteoporosis without current pathological fracture: Secondary | ICD-10-CM | POA: Diagnosis not present

## 2015-01-06 DIAGNOSIS — M456 Ankylosing spondylitis lumbar region: Secondary | ICD-10-CM | POA: Diagnosis not present

## 2015-01-06 DIAGNOSIS — E109 Type 1 diabetes mellitus without complications: Secondary | ICD-10-CM | POA: Diagnosis not present

## 2015-01-06 DIAGNOSIS — S32018D Other fracture of first lumbar vertebra, subsequent encounter for fracture with routine healing: Secondary | ICD-10-CM | POA: Diagnosis not present

## 2015-01-17 ENCOUNTER — Ambulatory Visit (INDEPENDENT_AMBULATORY_CARE_PROVIDER_SITE_OTHER): Payer: Medicare Other | Admitting: Endocrinology

## 2015-01-17 ENCOUNTER — Encounter: Payer: Self-pay | Admitting: Endocrinology

## 2015-01-17 ENCOUNTER — Telehealth: Payer: Self-pay | Admitting: Endocrinology

## 2015-01-17 VITALS — BP 130/74 | HR 96 | Temp 97.8°F | Ht 71.0 in | Wt 223.0 lb

## 2015-01-17 DIAGNOSIS — E109 Type 1 diabetes mellitus without complications: Secondary | ICD-10-CM

## 2015-01-17 DIAGNOSIS — M810 Age-related osteoporosis without current pathological fracture: Secondary | ICD-10-CM | POA: Diagnosis not present

## 2015-01-17 DIAGNOSIS — R911 Solitary pulmonary nodule: Secondary | ICD-10-CM

## 2015-01-17 DIAGNOSIS — IMO0001 Reserved for inherently not codable concepts without codable children: Secondary | ICD-10-CM

## 2015-01-17 LAB — BASIC METABOLIC PANEL
BUN: 27 mg/dL — ABNORMAL HIGH (ref 6–23)
CO2: 25 mEq/L (ref 19–32)
Calcium: 9.8 mg/dL (ref 8.4–10.5)
Chloride: 101 mEq/L (ref 96–112)
Creatinine, Ser: 1.69 mg/dL — ABNORMAL HIGH (ref 0.40–1.50)
GFR: 42.61 mL/min — ABNORMAL LOW (ref >60.00)
Glucose, Bld: 118 mg/dL — ABNORMAL HIGH (ref 70–99)
Potassium: 4.1 mEq/L (ref 3.5–5.1)
Sodium: 137 mEq/L (ref 135–145)

## 2015-01-17 LAB — POCT GLYCOSYLATED HEMOGLOBIN (HGB A1C): Hemoglobin A1C: 5

## 2015-01-17 LAB — VITAMIN D 25 HYDROXY (VIT D DEFICIENCY, FRACTURES): VITD: 33.57 ng/mL (ref 30.00–100.00)

## 2015-01-17 MED ORDER — INSULIN ISOPHANE HUMAN 100 UNIT/ML KWIKPEN: PEN_INJECTOR | SUBCUTANEOUS | Status: DC

## 2015-01-17 NOTE — Progress Notes (Signed)
Subjective:    Patient ID: Devin [name], male    DOB: 12/06/1940, 74 y.o.   MRN: [id number]  HPI Pt returns for f/u of diabetes mellitus: DM type: Insulin-requiring type 2.  Dx'ed: 1997.  Complications: none Therapy: insulin since 2002 DKA: never. Severe hypoglycemia: never. Pancreatitis: never Other: he takes multiple daily injections Interval history: no cbg record, but states cbg's are well-controlled.  He seldom has hypoglycemia, and these episodes are mild.  There is no trend throughout the day.  Past Medical History  Diagnosis Date  . VENTRAL HERNIA 04/14/2008  . PROSTATE CANCER, HX OF 12/29/2006  . OSTEOPOROSIS 12/29/2006  . 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 Hypocalcemia 04/13/2007  . HYPERTENSION 12/29/2006  . GOUT 12/29/2006  . DIABETES MELLITUS, TYPE I 12/29/2006  . CROHN'S DISEASE 04/14/2008  . 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 ANXIETY 12/29/2006  . Impotence   . Psoriasis     Past Surgical History  Procedure Laterality Date  . Total thyroidectomy  1997  . Tonsillectomy    . Cardiac catheterization  03/17/2003  . Stress cardiolite  02/17/2003  . Venous doppler  05/30/2004  . Electrocardiogram  03/27/2006  . Hernia repair  2009    ventral hernia    Social History   Social History  . Marital Status: Married    Spouse Name: N/A  . Number of Children: N/A  . Years of Education: N/A   Occupational History  . Retired    Social History Main Topics  . Smoking status: Former Research scientist (life sciences)  . Smokeless tobacco: Not on file  . Alcohol Use: Not on file  . Drug Use: Not on file  . Sexual Activity: Not on file   Other Topics Concern  . Not on file   Social History Narrative    Current Outpatient Prescriptions on File Prior to Visit  Medication Sig Dispense Refill  . ASACOL HD 800 MG TBEC Take  1,600 mg by mouth 2 (two) times daily.  3  . atenolol (TENORMIN) 25 MG tablet TAKE 0.5 TABLET BY MOUTH DAILY 45 tablet 0  . B-D ULTRAFINE III SHORT PEN 31G X 8 MM MISC USE FOUR TIMES DAILY AS DIRECTED 400 each 1  . BAYER CONTOUR TEST test strip TEST BLOOD SUGAR TWICE DAILY AS DIRECTED 200 each 1  . busPIRone (BUSPAR) 15 MG tablet TAKE 1 TABLET BY MOUTH TWICE DAILY 180 tablet 0  . Glucose Blood (ASCENSIA CONTOUR TEST VI) Use as directed to test two times a day     . insulin lispro (HUMALOG) 100 UNIT/ML injection before meals 3 times a day 25-25-30 units, and pen needles 4/day 30 mL 12  . insulin NPH (HUMULIN N PEN) 100 UNIT/ML injection Inject 20 Units into the skin at bedtime. 20 mL 1  . Insulin Pen Needle (B-D ULTRAFINE III SHORT PEN) 31G X 8 MM MISC 1 each by Other route 4 (four) times daily. 400 each 1  . leuprolide (LUPRON) 11.25 MG injection Inject 11.25 mg into the muscle every 3 (three) months.    . levothyroxine (SYNTHROID, LEVOTHROID) 50 MCG tablet Take 25 mcg by mouth daily before breakfast.    . lisinopril (PRINIVIL,ZESTRIL) 20 MG tablet Take 20 mg by mouth daily.    . tadalafil (CIALIS) 20 MG tablet Take 1 tablet (20 mg total) by mouth  as needed. 30 tablet 1  . oxyCODONE-acetaminophen (PERCOCET/ROXICET) 5-325 MG per tablet Take 1-2 tablets by mouth every 6 (six) hours as needed for moderate pain. (Patient not taking: Reported on 01/17/2015) 90 tablet 0   No current facility-administered medications on file prior to visit.    No Known Allergies  Family History  Problem Relation Age of Onset  . Cancer Neg Hx     No cancer in the patient's immediate family, except of course for the patient himself, as noted    BP 130/74 mmHg  Pulse 96  Temp(Src) 97.8 F (36.6 C) (Oral)  Ht '5\' 11"'$  (1.803 m)  Wt 223 lb (101.152 kg)  BMI 31.12 kg/m2  SpO2 97%  Review of Systems He has lost weight, due to recent back injury.      Objective:   Physical Exam VITAL SIGNS:  See vs  page GENERAL: no distress Pulses: dorsalis pedis intact bilat.   MSK: no deformity of the feet CV: no leg edema Skin:  no ulcer on the feet.  normal color and temp on the feet. Neuro: sensation is intact to touch on the feet.     A1c=5.6%  outside test results are reviewed: CT report: 2 cm LUL nodule  Lab Results  Component Value Date   CREATININE 1.69* 01/17/2015   BUN 27* 01/17/2015   NA 137 01/17/2015   K 4.1 01/17/2015   CL 101 01/17/2015   CO2 25 01/17/2015       Assessment & Plan:  Renal insufficiency, new.  We'll follow.   pulm nodule, new.  We'll check bx.   DM: overcontrolled.    Patient is advised the following: Patient Instructions  Please decrease the NPH to 10 units at bedtime. check your blood sugar twice a day.  vary the time of day when you check, between before the 3 meals, and at bedtime.  also check if you have symptoms of your blood sugar being too high or too low.  please keep a record of the readings and bring it to your next appointment here.  You can write it on any piece of paper.  please call us sooner if your blood sugar goes below 70, or if you have a lot of readings over 200. Please sign a release of information for the CT scan done in [facility]. blood tests are requested for you today.  We'll let you know about the results. If you don't need vitamin-D, we can prescribe for you different medication for the osteoporosis.    addendum: we'll do prior auth for prolia.

## 2015-01-17 NOTE — Telephone Encounter (Signed)
please call patient: i have reviewed CT report.  They recommend biopsy, which is done in x-ray.  Ok with you to schedule?

## 2015-01-17 NOTE — Patient Instructions (Addendum)
Please decrease the NPH to 10 units at bedtime. check your blood sugar twice a day.  vary the time of day when you check, between before the 3 meals, and at bedtime.  also check if you have symptoms of your blood sugar being too high or too low.  please keep a record of the readings and bring it to your next appointment here.  You can write it on any piece of paper.  please call us sooner if your blood sugar goes below 70, or if you have a lot of readings over 200. Please sign a release of information for the CT scan done in [facility]. blood tests are requested for you today.  We'll let you know about the results. If you don't need vitamin-D, we can prescribe for you different medication for the osteoporosis.

## 2015-01-18 DIAGNOSIS — IMO0001 Reserved for inherently not codable concepts without codable children: Secondary | ICD-10-CM | POA: Insufficient documentation

## 2015-01-18 DIAGNOSIS — R911 Solitary pulmonary nodule: Secondary | ICD-10-CM | POA: Insufficient documentation

## 2015-01-18 LAB — PTH, INTACT AND CALCIUM
Calcium: 9.8 mg/dL (ref 8.4–10.5)
PTH: 24 pg/mL (ref 14–64)

## 2015-01-18 NOTE — Telephone Encounter (Signed)
Pt.notified

## 2015-01-18 NOTE — Telephone Encounter (Signed)
I contacted the pt and advised of the note below. Pt agreed with having the biopsy done.

## 2015-01-18 NOTE — Telephone Encounter (Signed)
Ok, i have ordered.  you will receive a phone call, about a day and time for an appointment

## 2015-01-19 ENCOUNTER — Telehealth: Payer: Self-pay

## 2015-01-19 NOTE — Telephone Encounter (Signed)
Hey Rose, Could we initiate a Prolia PA for this pt? This pt has not been on prolia shots before.  Thanks, [employer] LPN

## 2015-01-21 ENCOUNTER — Other Ambulatory Visit: Payer: Self-pay | Admitting: Endocrinology

## 2015-01-21 DIAGNOSIS — IMO0001 Reserved for inherently not codable concepts without codable children: Secondary | ICD-10-CM

## 2015-01-21 DIAGNOSIS — R911 Solitary pulmonary nodule: Secondary | ICD-10-CM

## 2015-01-22 ENCOUNTER — Other Ambulatory Visit: Payer: Self-pay | Admitting: Endocrinology

## 2015-01-23 ENCOUNTER — Telehealth: Payer: Self-pay | Admitting: Endocrinology

## 2015-01-23 NOTE — Telephone Encounter (Signed)
[facility] radiology dept requesting that we submit a request for the films of cts done on [facility] we sent in a request for the report but they need films

## 2015-01-24 NOTE — Telephone Encounter (Signed)
The # needs to be sent to [facility] Radiology Dept, Attn: Velna Hatchet.   Radiology is having to wait for the films to be received by the radiology dept. Possible CT to be ordered by [place] what would you prefer wait on them to receive the films or

## 2015-01-30 NOTE — Telephone Encounter (Signed)
I have electronically submitted pt's info for Prolia insurance verification and will notify you once I have a response. Thank you. °

## 2015-01-31 ENCOUNTER — Telehealth: Payer: Self-pay | Admitting: Endocrinology

## 2015-01-31 NOTE — Telephone Encounter (Signed)
Received disc given to [employer]

## 2015-01-31 NOTE — Telephone Encounter (Signed)
Pt would like to know the status of the chest xray appt

## 2015-02-03 ENCOUNTER — Other Ambulatory Visit: Payer: Self-pay | Admitting: Endocrinology

## 2015-02-03 NOTE — Telephone Encounter (Signed)
I never received this on 01/31/2015 and still have not received it.

## 2015-02-03 NOTE — Telephone Encounter (Signed)
[name] forwarded this disc to Brandi on 02/01/15 in Annasha's absence

## 2015-02-06 NOTE — Telephone Encounter (Signed)
I have rec'd pt's ins verification for Prolia.  He will have an estimated responsibility of 0% plus $166 deductible (if not met).  Please let him know this is an estimate and we will not know an exact amt until his insurance has paid.  I have sent a copy of the summary of benefits to be scanned into his chart.  If pt has not met all or any of his deductible and cannot affort his injection then please advise him to contact Prolia at [phone] and select option #1 to see if he qualifies for one of their assistance programs.  If he qualifies they will instruct him how to proceed.  Please let me know if you have any questions. Thank you.

## 2015-02-06 NOTE — Telephone Encounter (Signed)
Left voicemail advising of note below. Requested a call back from the pt to discuss.

## 2015-02-07 ENCOUNTER — Other Ambulatory Visit (HOSPITAL_COMMUNITY): Payer: Self-pay | Admitting: Radiology

## 2015-02-07 ENCOUNTER — Inpatient Hospital Stay
Admission: RE | Admit: 2015-02-07 | Discharge: 2015-02-07 | Disposition: A | Discharge status: Home / Self Care | Payer: Self-pay | Source: Ambulatory Visit | Attending: Radiology | Admitting: Radiology

## 2015-02-07 DIAGNOSIS — R52 Pain, unspecified: Secondary | ICD-10-CM

## 2015-02-07 NOTE — Telephone Encounter (Signed)
I contacted the pt. Pt was given the website for prolia and was advised to look over the information and to please contact our office if he would like to proceed with the shot. Pt stated he would contact us.

## 2015-02-07 NOTE — Telephone Encounter (Signed)
Pt calling to discuss shot with [facility]

## 2015-02-09 DIAGNOSIS — S32009A Unspecified fracture of unspecified lumbar vertebra, initial encounter for closed fracture: Secondary | ICD-10-CM | POA: Diagnosis not present

## 2015-02-15 ENCOUNTER — Other Ambulatory Visit: Payer: Self-pay | Admitting: Radiology

## 2015-02-16 ENCOUNTER — Ambulatory Visit (HOSPITAL_COMMUNITY)
Admission: RE | Admit: 2015-02-16 | Discharge: 2015-02-16 | Disposition: A | Discharge status: Home / Self Care | Payer: Medicare Other | Source: Ambulatory Visit | Attending: Endocrinology | Admitting: Endocrinology

## 2015-02-16 ENCOUNTER — Encounter (HOSPITAL_COMMUNITY): Payer: Self-pay

## 2015-02-16 ENCOUNTER — Ambulatory Visit (HOSPITAL_COMMUNITY)
Admission: RE | Admit: 2015-02-16 | Discharge: 2015-02-16 | Disposition: A | Discharge status: Home / Self Care | Payer: Medicare Other | Source: Ambulatory Visit | Attending: Interventional Radiology | Admitting: Interventional Radiology

## 2015-02-16 DIAGNOSIS — Z85850 Personal history of malignant neoplasm of thyroid: Secondary | ICD-10-CM | POA: Insufficient documentation

## 2015-02-16 DIAGNOSIS — L409 Psoriasis, unspecified: Secondary | ICD-10-CM | POA: Insufficient documentation

## 2015-02-16 DIAGNOSIS — R911 Solitary pulmonary nodule: Secondary | ICD-10-CM

## 2015-02-16 DIAGNOSIS — E890 Postprocedural hypothyroidism: Secondary | ICD-10-CM | POA: Diagnosis not present

## 2015-02-16 DIAGNOSIS — Z85828 Personal history of other malignant neoplasm of skin: Secondary | ICD-10-CM | POA: Diagnosis not present

## 2015-02-16 DIAGNOSIS — K5090 Crohn's disease, unspecified, without complications: Secondary | ICD-10-CM | POA: Insufficient documentation

## 2015-02-16 DIAGNOSIS — C3412 Malignant neoplasm of upper lobe, left bronchus or lung: Secondary | ICD-10-CM | POA: Insufficient documentation

## 2015-02-16 DIAGNOSIS — M810 Age-related osteoporosis without current pathological fracture: Secondary | ICD-10-CM | POA: Insufficient documentation

## 2015-02-16 DIAGNOSIS — I10 Essential (primary) hypertension: Secondary | ICD-10-CM | POA: Diagnosis not present

## 2015-02-16 DIAGNOSIS — Z87891 Personal history of nicotine dependence: Secondary | ICD-10-CM | POA: Insufficient documentation

## 2015-02-16 DIAGNOSIS — Z79899 Other long term (current) drug therapy: Secondary | ICD-10-CM | POA: Insufficient documentation

## 2015-02-16 DIAGNOSIS — E109 Type 1 diabetes mellitus without complications: Secondary | ICD-10-CM | POA: Diagnosis not present

## 2015-02-16 DIAGNOSIS — Z9889 Other specified postprocedural states: Secondary | ICD-10-CM

## 2015-02-16 DIAGNOSIS — Z8546 Personal history of malignant neoplasm of prostate: Secondary | ICD-10-CM | POA: Insufficient documentation

## 2015-02-16 DIAGNOSIS — IMO0001 Reserved for inherently not codable concepts without codable children: Secondary | ICD-10-CM

## 2015-02-16 LAB — CBC
HCT: 38.6 % — ABNORMAL LOW (ref 39.0–52.0)
Hemoglobin: 12.6 g/dL — ABNORMAL LOW (ref 13.0–17.0)
MCH: 30.3 pg (ref 26.0–34.0)
MCHC: 32.6 g/dL (ref 30.0–36.0)
MCV: 92.8 fL (ref 78.0–100.0)
Platelets: 220 10*3/uL (ref 150–400)
RBC: 4.16 MIL/uL — ABNORMAL LOW (ref 4.22–5.81)
RDW: 14.2 % (ref 11.5–15.5)
WBC: 7.5 10*3/uL (ref 4.0–10.5)

## 2015-02-16 LAB — PROTIME-INR
INR: 1.08 (ref 0.00–1.49)
Prothrombin Time: 14.2 seconds (ref 11.6–15.2)

## 2015-02-16 LAB — GLUCOSE, CAPILLARY
Glucose-Capillary: 106 mg/dL — ABNORMAL HIGH (ref 65–99)
Glucose-Capillary: 104 mg/dL — ABNORMAL HIGH (ref 65–99)

## 2015-02-16 LAB — APTT: aPTT: 29 seconds (ref 24–37)

## 2015-02-16 MED ORDER — MIDAZOLAM HCL 2 MG/2ML IJ SOLN
INTRAMUSCULAR | Status: AC
  Filled 2015-02-16: qty 2

## 2015-02-16 MED ORDER — FENTANYL CITRATE (PF) 100 MCG/2ML IJ SOLN
INTRAMUSCULAR | Status: AC
  Filled 2015-02-16: qty 2

## 2015-02-16 MED ORDER — SODIUM CHLORIDE 0.9 % IV SOLN: INTRAVENOUS | Status: DC

## 2015-02-16 MED ORDER — SODIUM CHLORIDE 0.9 % IV SOLN
INTRAVENOUS | Status: AC | PRN
  Administered 2015-02-16: 10 mL/h via INTRAVENOUS

## 2015-02-16 MED ORDER — MIDAZOLAM HCL 2 MG/2ML IJ SOLN
INTRAMUSCULAR | Status: AC | PRN
  Administered 2015-02-16: 1 mg via INTRAVENOUS

## 2015-02-16 MED ORDER — FENTANYL CITRATE (PF) 100 MCG/2ML IJ SOLN
INTRAMUSCULAR | Status: AC | PRN
  Administered 2015-02-16: 50 ug via INTRAVENOUS

## 2015-02-16 MED ORDER — HYDROCODONE-ACETAMINOPHEN 5-325 MG PO TABS: 1.0000 | ORAL_TABLET | ORAL | Status: DC | PRN

## 2015-02-16 NOTE — Sedation Documentation (Signed)
Patient denies pain and is resting comfortably.  

## 2015-02-16 NOTE — Sedation Documentation (Signed)
Noted skin tear on right outer hand, dime sized, between 5th finger and wrist. Blotted with gauze and tegaderm applied. Not sure when or this occurred and patient also does not know. He states he has very thin skin and this happens frequently. Maybe occurred when rolling him back onto stretcher while his arm was at his side.

## 2015-02-16 NOTE — Discharge Instructions (Signed)
Needle Biopsy of Lung, Care After °Refer to this sheet in the next few weeks. These instructions provide you with information on caring for yourself after your procedure. Your health care provider may also give you more specific instructions. Your treatment has been planned according to current medical practices, but problems sometimes occur. Call your health care provider if you have any problems or questions after your procedure. °WHAT TO EXPECT AFTER THE PROCEDURE °· A bandage will be applied over the area where the needle was inserted. You may be asked to apply pressure to the bandage for several minutes to ensure there is minimal bleeding. °· In most cases, you can leave when your needle biopsy procedure is completed. Do not drive yourself home. Someone else should take you home. °· If you received an IV sedative or general anesthetic, you will be taken to a comfortable place to relax while the medicine wears off. °· If you have upcoming travel scheduled, talk to your health care provider about when it is safe to travel by air after the procedure. °HOME CARE INSTRUCTIONS °· Expect to take it easy for the rest of the day. °· Protect the area where you received the needle biopsy by keeping the bandage in place for as long as instructed. °· You may feel some mild pain or discomfort in the area, but this should stop in a day or two. °· Take medicines only as directed by your health care provider. °SEEK MEDICAL CARE IF:  °· You have pain at the biopsy site that worsens or is not helped by medicine. °· You have swelling or drainage at the needle biopsy site. °· You have a fever. °SEEK IMMEDIATE MEDICAL CARE IF:  °· You have new or worsening shortness of breath. °· You have chest pain. °· You are coughing up blood. °· You have bleeding that does not stop with pressure or a bandage. °· You develop light-headedness or fainting. °Document Released: 03/24/2007 Document Revised: 10/11/2013 Document Reviewed:  10/19/2012 °ExitCare® Patient Information ©2015 [employer]. This information is not intended to replace advice given to you by your health care provider. Make sure you discuss any questions you have with your health care provider. ° °

## 2015-02-16 NOTE — H&P (Signed)
Chief Complaint: Patient was seen in consultation today for Left lung mass at the request of [place]  Referring Physician(s): Ellison,Sean  History of Present Illness: Devin [name] is a 74 y.o. male   Pt was seen for back pain 12/12/2014 Ct scan incidentally revealed 2.2 L lung mass Suspicious for malignancy Hx prostate cancer and basal cell carcinoma Lumbar 1 surgery with Dr Ronnald Ramp 12/14/2014 Now referred for L lung mass biopsy per Dr Loanne Drilling Approved with Dr Barbie Banner   Past Medical History  Diagnosis Date  . VENTRAL HERNIA 04/14/2008  . PROSTATE CANCER, HX OF 12/29/2006  . OSTEOPOROSIS 12/29/2006  . 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 Hypocalcemia 04/13/2007  . HYPERTENSION 12/29/2006  . GOUT 12/29/2006  . DIABETES MELLITUS, TYPE I 12/29/2006  . CROHN'S DISEASE 04/14/2008  . 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 ANXIETY 12/29/2006  . Impotence   . Psoriasis     Past Surgical History  Procedure Laterality Date  . Total thyroidectomy  1997  . Tonsillectomy    . Cardiac catheterization  03/17/2003  . Stress cardiolite  02/17/2003  . Venous doppler  05/30/2004  . Electrocardiogram  03/27/2006  . Hernia repair  2009    ventral hernia    Allergies: Review of patient's allergies indicates no known allergies.  Medications: Prior to Admission medications   Medication Sig Start Date End Date Taking? Authorizing Provider  ASACOL HD 800 MG TBEC Take 1,600 mg by mouth 2 (two) times daily. 11/14/14  Yes Historical Provider, MD  atenolol (TENORMIN) 25 MG tablet TAKE 0.5 TABLET BY MOUTH DAILY 11/23/14  Yes Renato Shin, MD  B-D ULTRAFINE III SHORT PEN 31G X 8 MM MISC USE FOUR TIMES DAILY AS DIRECTED 09/16/14  Yes Renato Shin, MD  BAYER CONTOUR TEST test strip TEST BLOOD SUGAR TWICE DAILY AS DIRECTED 08/08/14  Yes Renato Shin, MD  busPIRone (BUSPAR) 15 MG tablet TAKE 1 TABLET BY MOUTH TWICE DAILY 11/23/14  Yes Renato Shin, MD  calcium carbonate (OS-CAL) 1250 (500 CA) MG chewable tablet Chew 1 tablet by mouth 2 (two) times daily.   Yes Historical Provider, MD  cholecalciferol (VITAMIN D) 1000 UNITS tablet Take 1,000 Units by mouth daily.   Yes Historical Provider, MD  Glucose Blood (ASCENSIA CONTOUR TEST VI) Use as directed to test two times a day    Yes Historical Provider, MD  HUMALOG KWIKPEN 100 UNIT/ML [place]. 25 UNITS, 25 UNITS, AND 30 UNITS RESPECTIVELY BEFORE MEALS. 02/03/15  Yes Renato Shin, MD  insulin NPH (HUMULIN N PEN) 100 UNIT/ML injection Inject 20 Units into the skin at bedtime. 08/02/11  Yes Renato Shin, MD  Insulin Pen Needle (B-D ULTRAFINE III SHORT PEN) 31G X 8 MM MISC 1 each by Other route 4 (four) times daily. 09/14/12  Yes Renato Shin, MD  levothyroxine (SYNTHROID, LEVOTHROID) 175 MCG tablet TAKE 1 TABLET BY MOUTH EVERY DAY BEFORE BREAKFAST. 01/23/15  Yes Renato Shin, MD  lisinopril (PRINIVIL,ZESTRIL) 20 MG tablet Take 20 mg by mouth daily.   Yes Historical Provider, MD  Multiple Vitamins-Minerals (CENTRUM SILVER) tablet Take 1 tablet by mouth daily.   Yes Historical Provider, MD  insulin lispro (HUMALOG) 100 UNIT/ML injection before meals 3 times a day 25-25-30 units, and pen needles 4/day Patient not taking: Reported on 02/15/2015  05/25/13   Renato Shin, MD  Insulin NPH, Human,, Isophane, (HUMULIN N KWIKPEN) 100 UNIT/ML Kiwkpen ADMINISTER 10 UNITS UNDER THE SKIN EVERY NIGHT AT BEDTIME Patient not taking: Reported on 02/15/2015 01/17/15   Renato Shin, MD  leuprolide (LUPRON) 11.25 MG injection Inject 11.25 mg into the muscle every 6 (six) months.     Historical Provider, MD  levothyroxine (SYNTHROID, LEVOTHROID) 50 MCG tablet Take 25 mcg by mouth daily before breakfast.    Historical Provider, MD  oxyCODONE-acetaminophen (PERCOCET/ROXICET) 5-325 MG per  tablet Take 1-2 tablets by mouth every 6 (six) hours as needed for moderate pain. Patient not taking: Reported on 01/17/2015 12/21/14   Eustace Moore, MD  tadalafil (CIALIS) 20 MG tablet Take 1 tablet (20 mg total) by mouth as needed. Patient not taking: Reported on 02/15/2015 08/02/11   Renato Shin, MD     Family History  Problem Relation Age of Onset  . Cancer Neg Hx     No cancer in the patient's immediate family, except of course for the patient himself, as noted    Social History   Social History  . Marital Status: Married    Spouse Name: N/A  . Number of Children: N/A  . Years of Education: N/A   Occupational History  . Retired    Social History Main Topics  . Smoking status: Former Research scientist (life sciences)  . Smokeless tobacco: None  . Alcohol Use: None  . Drug Use: None  . Sexual Activity: Not Asked   Other Topics Concern  . None   Social History Narrative     Review of Systems: A 12 point ROS discussed and pertinent positives are indicated in the HPI above.  All other systems are negative.  Review of Systems  Constitutional: Negative for fever, activity change and unexpected weight change.  Respiratory: Negative for cough and shortness of breath.   Cardiovascular: Negative for chest pain.  Musculoskeletal: Negative for back pain.  Neurological: Negative for weakness.  Psychiatric/Behavioral: Negative for behavioral problems and confusion.    Vital Signs: BP 137/52 mmHg  Pulse 86  Temp(Src) 97.6 F (36.4 C)  Resp 18  Ht '5\' 11"'$  (1.803 m)  Wt 228 lb (103.42 kg)  BMI 31.81 kg/m2  SpO2 100%  Physical Exam  Constitutional: He is oriented to person, place, and time.  Cardiovascular: Normal rate, regular rhythm and normal heart sounds.   No murmur heard. Pulmonary/Chest: Effort normal and breath sounds normal. He has no wheezes.  Abdominal: Soft. Bowel sounds are normal. There is no tenderness.  Musculoskeletal: Normal range of motion.  Neurological: He is alert and  oriented to person, place, and time.  Skin: Skin is warm and dry.  Psychiatric: He has a normal mood and affect. His behavior is normal. Judgment and thought content normal.  Nursing note and vitals reviewed.   Mallampati Score:  MD Evaluation Airway: WNL Heart: WNL Abdomen: WNL Chest/ Lungs: WNL ASA  Classification: 3 Mallampati/Airway Score: One  Imaging: Ct Outside Films Spine  02/07/2015   This examination belongs to an outside facility and is stored  here for comparison purposes only.  Contact the originating outside  institution for any associated report or interpretation.   Labs:  CBC:  Recent Labs  05/03/14 0905 12/15/14 0316 12/16/14 0930 02/16/15 0757  WBC 10.7* 8.7 8.5 7.5  HGB 13.5 11.8* 12.2* 12.6*  HCT 41.1 35.4* 35.7* 38.6*  PLT 265.0 193 202 220    COAGS:  Recent Labs  12/15/14 0316 12/16/14 0930  02/16/15 0757  INR 1.18 1.19 1.08  APTT  --   --  29    BMP:  Recent Labs  05/03/14 0905 12/15/14 0316 12/16/14 0930 01/17/15 0850  NA 139 138 138 137  K 4.0 4.3 4.0 4.1  CL 103 106 107 101  CO2 '25 25 24 25  '$ GLUCOSE 129* 134* 146* 118*  BUN '20 16 13 '$ 27*  CALCIUM 9.3 8.3* 8.6* 9.8  9.8  CREATININE 1.5 1.29* 1.15 1.69*  GFRNONAA  --  54* >60  --   GFRAA  --  >60 >60  --     LIVER FUNCTION TESTS:  Recent Labs  05/03/14 0905  BILITOT 0.7  AST 48*  ALT 36  ALKPHOS 77  PROT 6.9  ALBUMIN 4.0    TUMOR MARKERS: No results for input(s): AFPTM, CEA, CA199, CHROMGRNA in the last 8760 hours.  Assessment and Plan:  Incidental finding of L lung mass on CT of spine for back pain Scheduled now for bx of same Risks and Benefits discussed with the patient including, but not limited to bleeding, hemoptysis, respiratory failure requiring intubation, infection, pneumothorax requiring chest tube placement, stroke from air embolism or even death. All of the patient's questions were answered, patient is agreeable to proceed. Consent signed and  in chart.   Thank you for this interesting consult.  I greatly enjoyed meeting Devin [name] and look forward to participating in their care.  A copy of this report was sent to the requesting provider on this date.  Signed: Kitiara Hintze A 02/16/2015, 8:46 AM   I spent a total of  30 Minutes   in face to face in clinical consultation, greater than 50% of which was counseling/coordinating care for L lung mass bx

## 2015-02-16 NOTE — Procedures (Signed)
Interventional Radiology Procedure Note  Procedure: CT guided biopsy of LUL pulmonary nodule Complications: No immediate Recommendations: - Bedrest until CXR cleared.  Minimize talking, coughing or otherwise straining.  - Follow up 2 hr CXR pending   Signed,  Criselda Peaches, MD

## 2015-02-23 ENCOUNTER — Encounter: Payer: Self-pay | Admitting: *Deleted

## 2015-02-23 ENCOUNTER — Telehealth: Payer: Self-pay

## 2015-02-23 DIAGNOSIS — R911 Solitary pulmonary nodule: Secondary | ICD-10-CM

## 2015-02-23 NOTE — Telephone Encounter (Signed)
i called pt and wife. i'll ref surg

## 2015-02-23 NOTE — Progress Notes (Signed)
Oncology Nurse Navigator Documentation  Oncology Nurse Navigator Flowsheets 02/23/2015  Navigator Encounter Type Other/I called Dr. Burnett Sheng office today to update them on patient's recommendations from cancer conference.  I spoke with his nurse and she will get back with me regarding follow up  Time Spent with Patient 15

## 2015-02-23 NOTE — Telephone Encounter (Signed)
Hinton Dyer from [facility] called. She stated the pt at a lung biopsy done. 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 The recommendation is for the patient to have a pet scan resection done and be evaluated by thoracic surgery.

## 2015-02-27 ENCOUNTER — Telehealth: Payer: Self-pay | Admitting: Endocrinology

## 2015-02-27 NOTE — Telephone Encounter (Signed)
Pt returning a call.

## 2015-02-27 NOTE — Telephone Encounter (Signed)
Annasha, When you are available could you please call thoracic surgery and schedule his consult.  Thanks!

## 2015-02-27 NOTE — Telephone Encounter (Signed)
[name], wife, calling to see what time and date the pt is to see the surgeon, please advise

## 2015-02-27 NOTE — Telephone Encounter (Signed)
Information is documented inside referral

## 2015-03-06 ENCOUNTER — Other Ambulatory Visit: Payer: Self-pay | Admitting: *Deleted

## 2015-03-06 DIAGNOSIS — R911 Solitary pulmonary nodule: Secondary | ICD-10-CM

## 2015-03-08 ENCOUNTER — Institutional Professional Consult (permissible substitution) (INDEPENDENT_AMBULATORY_CARE_PROVIDER_SITE_OTHER): Payer: Medicare Other | Admitting: Cardiothoracic Surgery

## 2015-03-08 ENCOUNTER — Encounter (HOSPITAL_COMMUNITY)
Admission: RE | Admit: 2015-03-08 | Discharge: 2015-03-08 | Disposition: A | Discharge status: Home / Self Care | Payer: Medicare Other | Source: Ambulatory Visit | Attending: Cardiothoracic Surgery | Admitting: Cardiothoracic Surgery

## 2015-03-08 ENCOUNTER — Encounter: Payer: Self-pay | Admitting: Cardiothoracic Surgery

## 2015-03-08 ENCOUNTER — Ambulatory Visit (HOSPITAL_COMMUNITY)
Admission: RE | Admit: 2015-03-08 | Discharge: 2015-03-08 | Disposition: A | Discharge status: Home / Self Care | Payer: Medicare Other | Source: Ambulatory Visit | Attending: Cardiothoracic Surgery | Admitting: Cardiothoracic Surgery

## 2015-03-08 VITALS — BP 149/73 | HR 110 | Resp 20 | Ht 71.0 in | Wt 225.0 lb

## 2015-03-08 DIAGNOSIS — K8020 Calculus of gallbladder without cholecystitis without obstruction: Secondary | ICD-10-CM | POA: Diagnosis not present

## 2015-03-08 DIAGNOSIS — Z79899 Other long term (current) drug therapy: Secondary | ICD-10-CM | POA: Insufficient documentation

## 2015-03-08 DIAGNOSIS — R911 Solitary pulmonary nodule: Secondary | ICD-10-CM | POA: Insufficient documentation

## 2015-03-08 DIAGNOSIS — M459 Ankylosing spondylitis of unspecified sites in spine: Secondary | ICD-10-CM

## 2015-03-08 DIAGNOSIS — K5000 Crohn's disease of small intestine without complications: Secondary | ICD-10-CM

## 2015-03-08 DIAGNOSIS — C3490 Malignant neoplasm of unspecified part of unspecified bronchus or lung: Secondary | ICD-10-CM

## 2015-03-08 DIAGNOSIS — N200 Calculus of kidney: Secondary | ICD-10-CM | POA: Diagnosis not present

## 2015-03-08 DIAGNOSIS — K5710 Diverticulosis of small intestine without perforation or abscess without bleeding: Secondary | ICD-10-CM | POA: Diagnosis not present

## 2015-03-08 LAB — PULMONARY FUNCTION TEST
DL/VA % pred: 110 %
DL/VA: 4.94 ml/min/mmHg/L
DLCO unc % pred: 88 %
DLCO unc: 26.22 ml/min/mmHg
FEF 25-75 Post: 3.08 L/sec
FEF 25-75 Pre: 2.45 L/sec
FEF2575-%Change-Post: 25 %
FEF2575-%Pred-Post: 141 %
FEF2575-%Pred-Pre: 112 %
FEV1-%Change-Post: 7 %
FEV1-%Pred-Post: 87 %
FEV1-%Pred-Pre: 81 %
FEV1-Post: 2.57 L
FEV1-Pre: 2.39 L
FEV1FVC-%Change-Post: 2 %
FEV1FVC-%Pred-Pre: 110 %
FEV6-%Change-Post: 5 %
FEV6-%Pred-Post: 81 %
FEV6-%Pred-Pre: 77 %
FEV6-Post: 3.08 L
FEV6-Pre: 2.91 L
FEV6FVC-%Change-Post: 1 %
FEV6FVC-%Pred-Post: 105 %
FEV6FVC-%Pred-Pre: 104 %
FVC-%Change-Post: 4 %
FVC-%Pred-Post: 77 %
FVC-%Pred-Pre: 73 %
FVC-Post: 3.1 L
FVC-Pre: 2.96 L
Post FEV1/FVC ratio: 83 %
Post FEV6/FVC ratio: 99 %
Pre FEV1/FVC ratio: 81 %
Pre FEV6/FVC Ratio: 98 %
RV % pred: 76 %
RV: 1.83 L
TLC % pred: 72 %
TLC: 4.81 L

## 2015-03-08 LAB — GLUCOSE, CAPILLARY: Glucose-Capillary: 171 mg/dL — ABNORMAL HIGH (ref 65–99)

## 2015-03-08 IMAGING — CT NM PET TUM IMG INITIAL (PI) SKULL BASE T - THIGH
8 series · 25 of 25 positions shown · non-contrast
Comparison: Outside CT thoracic spine 12/12/2014.

CLINICAL DATA: Initial treatment strategy for lung nodule
(adenocarcinoma).

EXAM:
NUCLEAR MEDICINE PET SKULL BASE TO THIGH
TECHNIQUE: Eleven point for mCi F-18 FDG was injected intravenously. Full-ring
PET imaging was performed from the skull base to thigh after the
radiotracer. CT data was obtained and used for attenuation
correction and anatomic localization.
FASTING BLOOD GLUCOSE:  Value: 171 mg/dl

[Series 3: pet sk_thigh ac · axial · 5.0mm · 4.07mm/px · z∈[-887,+9]mm · 5 of 225 slices shown]
[im 1/225]
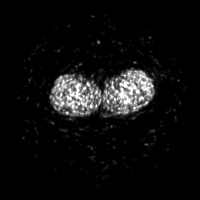
[im 57/225]
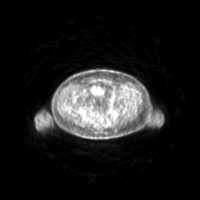
[im 113/225]
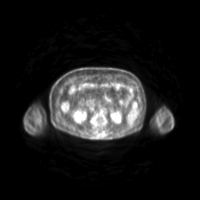
[im 169/225]
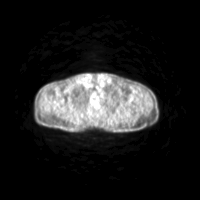
[im 225/225]
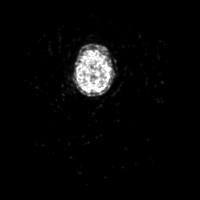

[Series 4: ct sk_thigh 5.0 hd_fov · axial · 5.0mm · 1.12mm/px · z∈[-887,+9]mm · 5 of 225 slices shown]
[im 1/225]
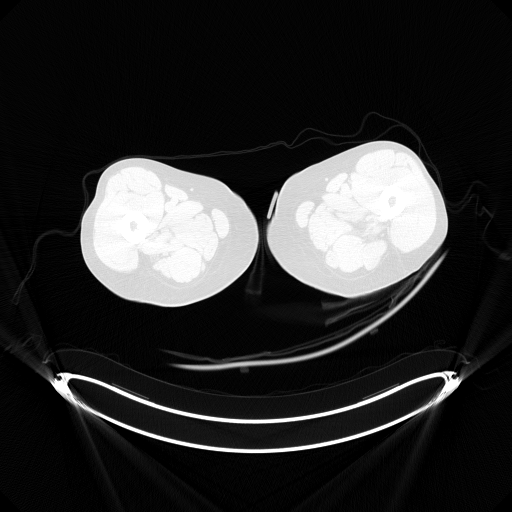
[im 57/225]
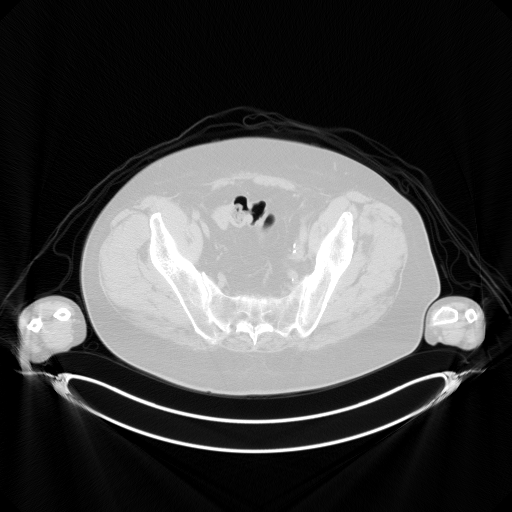
[im 113/225]
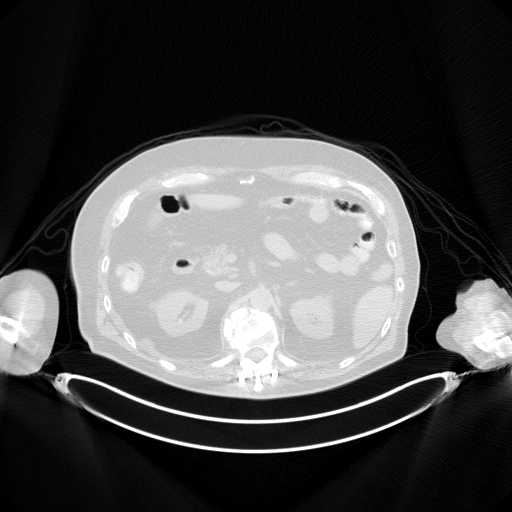
[im 169/225]
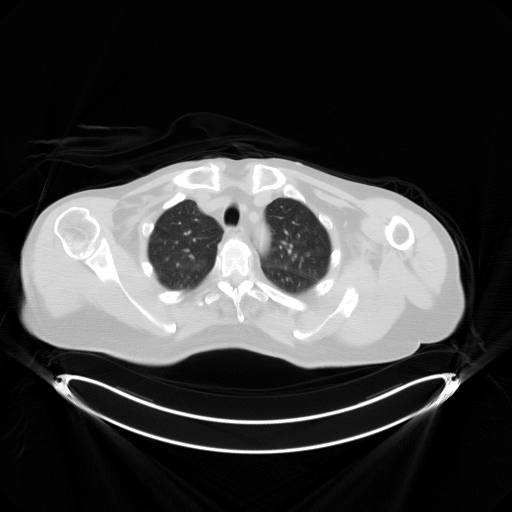
[im 225/225  brain]
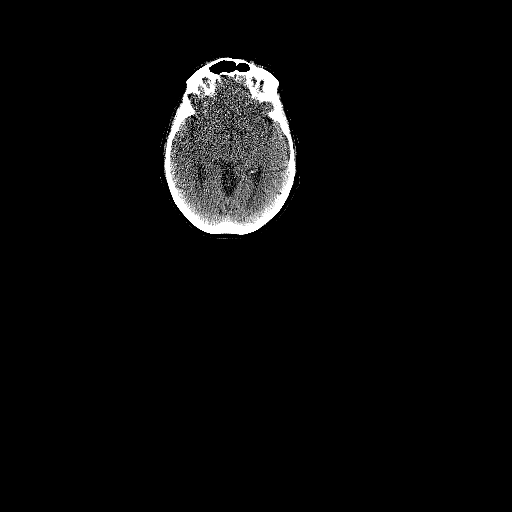

[Series 7: pet sk_thigh nac · axial · 5.0mm · 4.07mm/px · z∈[-887,+9]mm · 5 of 225 slices shown]
[im 1/225]
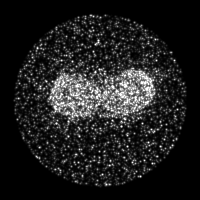
[im 57/225]
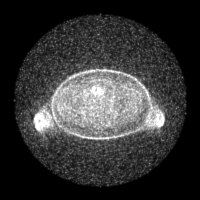
[im 113/225]
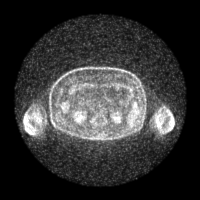
[im 169/225]
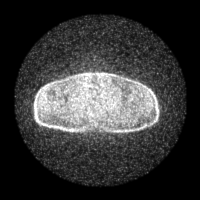
[im 225/225]
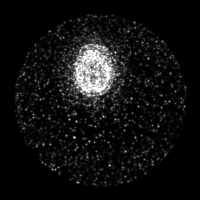

[Series 8: ct sk_thigh 5.0 b70f (id)_bone · axial · 5.0mm · 0.65mm/px · 1 of 57 slices shown]
[im 1/57  bone]
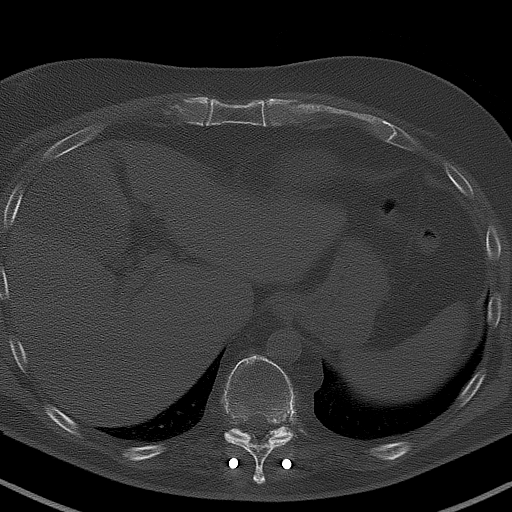

[Series 604: mip collection<mip range> · coronal · 1.86mm/px · 1 of 32 slices shown]
[im 1/32]
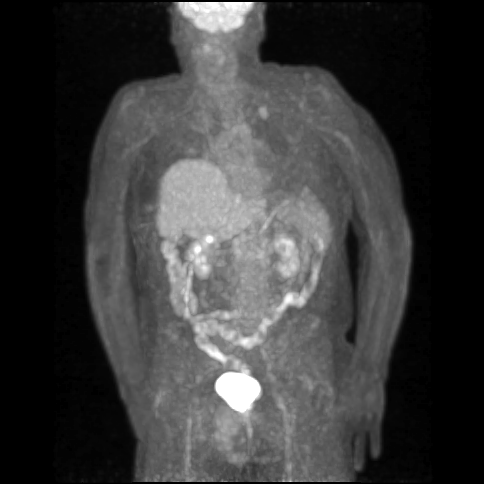

[Series 605: range-ct sk_thigh 5.0 hd_fov-cor-<alpha range> · 2 of 90 slices shown]
[im 1/90]
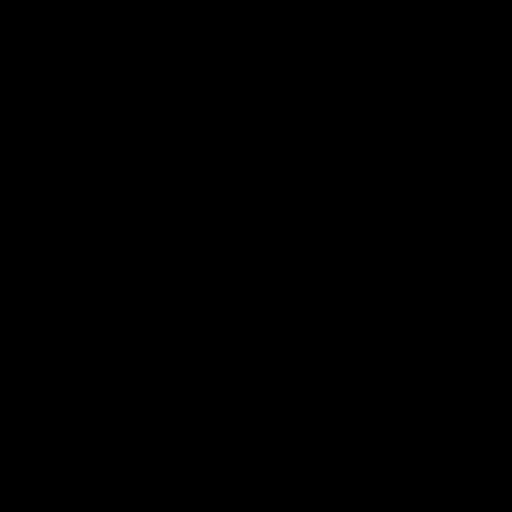
[im 90/90]
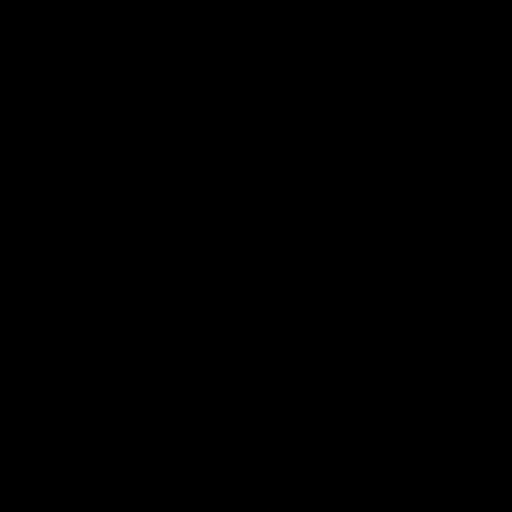

[Series 606: range-ct sk_thigh 5.0 hd_fov-tra-<alpha range> · 5 of 216 slices shown]
[im 1/216]
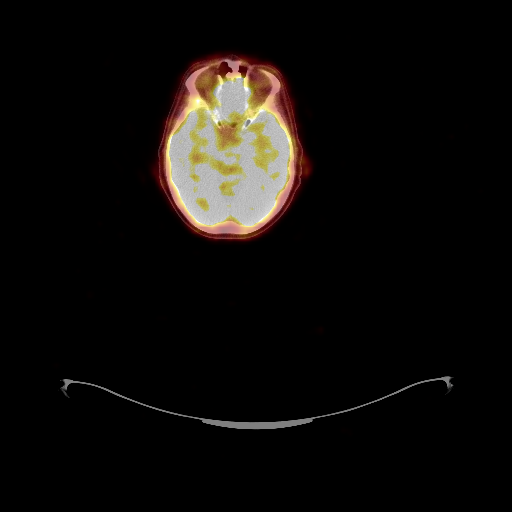
[im 54/216]
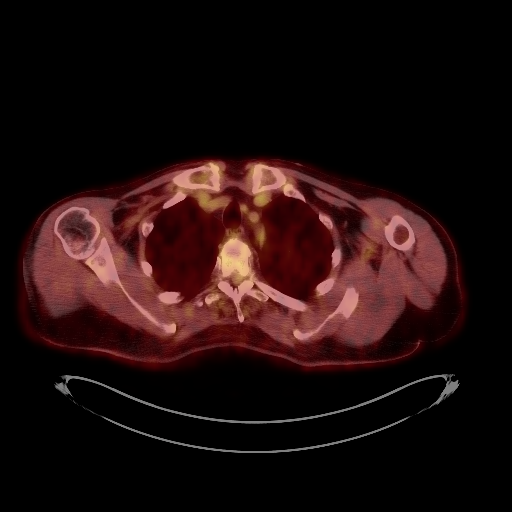
[im 108/216]
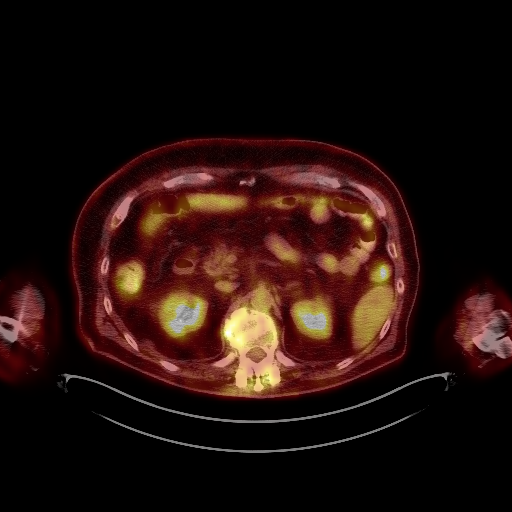
[im 162/216]
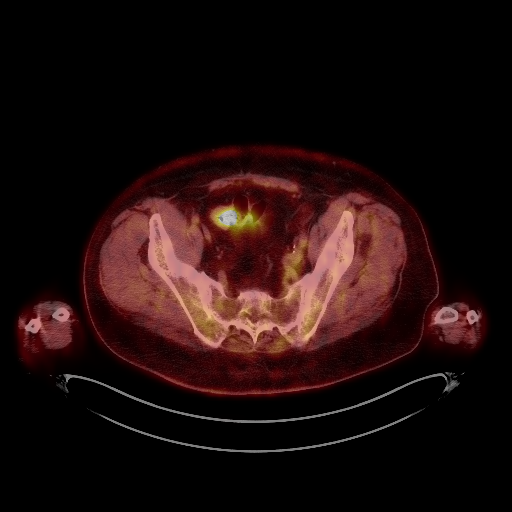
[im 216/216]
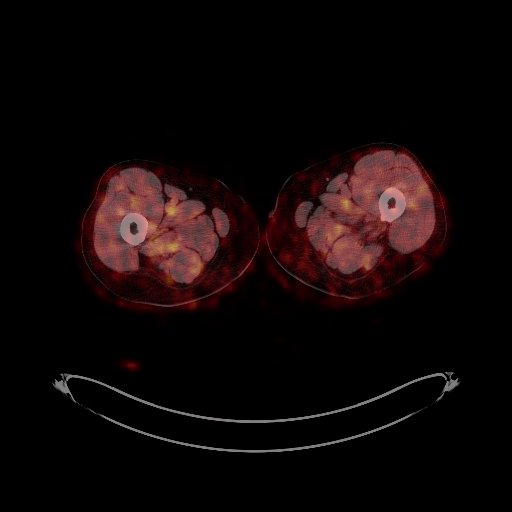

[Series 1032: results mm oncology reading · 1.02mm/px · 1 of 1 slices shown]
[im 1/1]
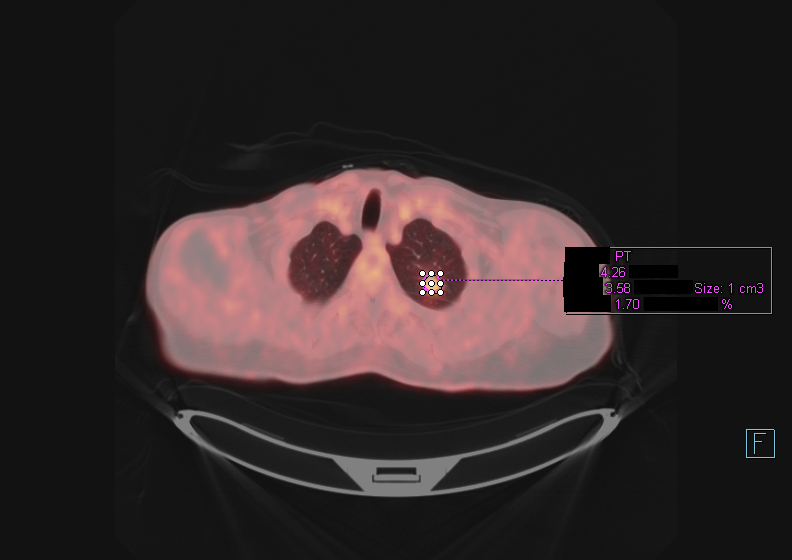

[25 of 25 positions shown; findings below may reference images not displayed]

FINDINGS: NECK

No hypermetabolic lymph nodes in the neck. CT images show no acute
findings.

CHEST

Mixed solid and ground-glass nodule in the apical portion of the
left upper lobe measures 1.9 x 2.4 cm with an SUV max of 4.3. The
internal solid component is seen medially and measures approximately
1.4 x 1.9 cm. There are tags to the adjacent pleura. No
hypermetabolic mediastinal, hilar or axillary lymph nodes. CT images
show no pericardial or pleural effusion.

ABDOMEN/PELVIS

No abnormal hypermetabolism in the liver, adrenal glands, spleen or
pancreas. No hypermetabolic lymph nodes. CT images show the liver to
be grossly unremarkable. A stone is seen in the gallbladder. Adrenal
glands are unremarkable. Tiny right renal stones. Left kidney,
spleen, pancreas and stomach are grossly unremarkable. Duodenal
diverticulum. Small bowel and colon are otherwise unremarkable.
Ventral hernia repair with laxity or recurrent midline ventral
hernia. Hazy densities in the ventral subcutaneous abdominal fat are
likely iatrogenic. Atherosclerotic calcification of the arterial
vasculature without abdominal aortic aneurysm. Prostatectomy.

SKELETON

No abnormal osseous hypermetabolism. Postoperative changes in the
thoracolumbar spine.
IMPRESSION: 1. Hypermetabolic apical left upper lobe nodule, consistent with the
given history of adenocarcinoma. Today's findings indicate stage IA
disease.
2. Cholelithiasis.
3. Right renal stones.
4. Ventral hernia repair with laxity or recurrent midline ventral
hernia.

## 2015-03-08 MED ORDER — ALBUTEROL SULFATE (2.5 MG/3ML) 0.083% IN NEBU
2.5000 mg | INHALATION_SOLUTION | Freq: Once | RESPIRATORY_TRACT | Status: AC
  Administered 2015-03-08: 2.5 mg via RESPIRATORY_TRACT

## 2015-03-08 MED ORDER — FLUDEOXYGLUCOSE F - 18 (FDG) INJECTION
11.4000 | Freq: Once | INTRAVENOUS | Status: DC | PRN
  Administered 2015-03-08: 11.39 via INTRAVENOUS
  Filled 2015-03-08: qty 11.4

## 2015-03-09 ENCOUNTER — Other Ambulatory Visit: Payer: Self-pay | Admitting: *Deleted

## 2015-03-09 DIAGNOSIS — C3412 Malignant neoplasm of upper lobe, left bronchus or lung: Secondary | ICD-10-CM

## 2015-03-10 ENCOUNTER — Other Ambulatory Visit: Payer: Self-pay | Admitting: *Deleted

## 2015-03-10 ENCOUNTER — Encounter: Payer: Self-pay | Admitting: Cardiology

## 2015-03-10 DIAGNOSIS — K5090 Crohn's disease, unspecified, without complications: Secondary | ICD-10-CM | POA: Insufficient documentation

## 2015-03-10 DIAGNOSIS — E1142 Type 2 diabetes mellitus with diabetic polyneuropathy: Secondary | ICD-10-CM

## 2015-03-10 DIAGNOSIS — C3492 Malignant neoplasm of unspecified part of left bronchus or lung: Secondary | ICD-10-CM | POA: Insufficient documentation

## 2015-03-10 DIAGNOSIS — E890 Postprocedural hypothyroidism: Secondary | ICD-10-CM

## 2015-03-10 DIAGNOSIS — E669 Obesity, unspecified: Secondary | ICD-10-CM | POA: Insufficient documentation

## 2015-03-10 DIAGNOSIS — M459 Ankylosing spondylitis of unspecified sites in spine: Secondary | ICD-10-CM

## 2015-03-10 DIAGNOSIS — C7989 Secondary malignant neoplasm of other specified sites: Secondary | ICD-10-CM

## 2015-03-10 DIAGNOSIS — Z85850 Personal history of malignant neoplasm of thyroid: Secondary | ICD-10-CM

## 2015-03-10 DIAGNOSIS — C44309 Unspecified malignant neoplasm of skin of other parts of face: Secondary | ICD-10-CM

## 2015-03-10 DIAGNOSIS — E1140 Type 2 diabetes mellitus with diabetic neuropathy, unspecified: Secondary | ICD-10-CM | POA: Insufficient documentation

## 2015-03-10 DIAGNOSIS — C61 Malignant neoplasm of prostate: Secondary | ICD-10-CM

## 2015-03-10 DIAGNOSIS — C3412 Malignant neoplasm of upper lobe, left bronchus or lung: Secondary | ICD-10-CM

## 2015-03-10 DIAGNOSIS — E785 Hyperlipidemia, unspecified: Secondary | ICD-10-CM | POA: Diagnosis not present

## 2015-03-10 DIAGNOSIS — I10 Essential (primary) hypertension: Secondary | ICD-10-CM

## 2015-03-10 DIAGNOSIS — M1A9XX0 Chronic gout, unspecified, without tophus (tophi): Secondary | ICD-10-CM

## 2015-03-10 DIAGNOSIS — Z01810 Encounter for preprocedural cardiovascular examination: Secondary | ICD-10-CM | POA: Diagnosis not present

## 2015-03-10 HISTORY — DX: Malignant neoplasm of unspecified part of left bronchus or lung: C34.92
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
## 2015-03-12 ENCOUNTER — Other Ambulatory Visit: Payer: Self-pay | Admitting: Endocrinology
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
## 2015-03-13 ENCOUNTER — Ambulatory Visit
Admission: RE | Admit: 2015-03-13 | Discharge: 2015-03-13 | Disposition: A | Discharge status: Home / Self Care | Payer: Medicare Other | Source: Ambulatory Visit | Attending: Cardiothoracic Surgery | Admitting: Cardiothoracic Surgery

## 2015-03-13 DIAGNOSIS — C3490 Malignant neoplasm of unspecified part of unspecified bronchus or lung: Secondary | ICD-10-CM | POA: Diagnosis not present

## 2015-03-13 DIAGNOSIS — C3412 Malignant neoplasm of upper lobe, left bronchus or lung: Secondary | ICD-10-CM

## 2015-03-13 MED ORDER — IOPAMIDOL (ISOVUE-300) INJECTION 61%: 75.0000 mL | Freq: Once | INTRAVENOUS | Status: DC | PRN

## 2015-03-14 ENCOUNTER — Encounter (HOSPITAL_COMMUNITY)
Admission: RE | Admit: 2015-03-14 | Discharge: 2015-03-14 | Disposition: A | Discharge status: Home / Self Care | Payer: Medicare Other | Source: Ambulatory Visit | Attending: Cardiothoracic Surgery | Admitting: Cardiothoracic Surgery

## 2015-03-14 ENCOUNTER — Encounter (HOSPITAL_COMMUNITY): Payer: Self-pay

## 2015-03-14 VITALS — BP 125/50 | HR 82 | Temp 98.2°F | Ht 69.0 in | Wt 224.8 lb

## 2015-03-14 DIAGNOSIS — R918 Other nonspecific abnormal finding of lung field: Secondary | ICD-10-CM | POA: Diagnosis not present

## 2015-03-14 DIAGNOSIS — C3412 Malignant neoplasm of upper lobe, left bronchus or lung: Secondary | ICD-10-CM

## 2015-03-14 DIAGNOSIS — Z01818 Encounter for other preprocedural examination: Secondary | ICD-10-CM | POA: Diagnosis not present

## 2015-03-14 LAB — URINALYSIS, ROUTINE W REFLEX MICROSCOPIC
Bilirubin Urine: NEGATIVE
Glucose, UA: NEGATIVE mg/dL
Hgb urine dipstick: NEGATIVE
Ketones, ur: NEGATIVE mg/dL
Leukocytes, UA: NEGATIVE
Nitrite: NEGATIVE
Protein, ur: NEGATIVE mg/dL
Specific Gravity, Urine: 1.006 (ref 1.005–1.030)
Urobilinogen, UA: 0.2 mg/dL (ref 0.0–1.0)
pH: 5.5 (ref 5.0–8.0)

## 2015-03-14 LAB — TYPE AND SCREEN
ABO/RH(D): O POS
Antibody Screen: NEGATIVE

## 2015-03-14 LAB — GLUCOSE, CAPILLARY: Glucose-Capillary: 132 mg/dL — ABNORMAL HIGH (ref 65–99)

## 2015-03-14 LAB — SURGICAL PCR SCREEN
MRSA, PCR: NEGATIVE
Staphylococcus aureus: NEGATIVE

## 2015-03-14 LAB — COMPREHENSIVE METABOLIC PANEL
ALT: 28 U/L (ref 17–63)
AST: 49 U/L — ABNORMAL HIGH (ref 15–41)
Albumin: 3.8 g/dL (ref 3.5–5.0)
Alkaline Phosphatase: 85 U/L (ref 38–126)
Anion gap: 11 (ref 5–15)
BUN: 18 mg/dL (ref 6–20)
CO2: 17 mmol/L — ABNORMAL LOW (ref 22–32)
Calcium: 9.7 mg/dL (ref 8.9–10.3)
Chloride: 108 mmol/L (ref 101–111)
Creatinine, Ser: 1.33 mg/dL — ABNORMAL HIGH (ref 0.61–1.24)
GFR calc Af Amer: >60 mL/min (ref >60)
GFR calc non Af Amer: 52 mL/min — ABNORMAL LOW (ref >60)
Glucose, Bld: 126 mg/dL — ABNORMAL HIGH (ref 65–99)
Potassium: 4.9 mmol/L (ref 3.5–5.1)
Sodium: 136 mmol/L (ref 135–145)
Total Bilirubin: 1 mg/dL (ref 0.3–1.2)
Total Protein: 6.5 g/dL (ref 6.5–8.1)

## 2015-03-14 LAB — PROTIME-INR
INR: 1.07 (ref 0.00–1.49)
Prothrombin Time: 14.1 seconds (ref 11.6–15.2)

## 2015-03-14 LAB — APTT: aPTT: 21 seconds — ABNORMAL LOW (ref 24–37)

## 2015-03-14 NOTE — Progress Notes (Signed)
REQUESTED EKG DONE LAST WEEK IN SEPT. FROM DR. TILLEY.

## 2015-03-14 NOTE — Pre-Procedure Instructions (Signed)
Devin [name]  03/14/2015      [facility], [place] [address] Phone: [phone] Fax: [phone]  [employer], [place] [address] [place] [address] [place] [address] Phone: [phone] Fax: [place] [address] - [place], [place] - [place] [address] [place] [address] Phone: [phone] Fax: [phone]    Your procedure is scheduled on  Thursday  03/16/15  Report to [facility] Admitting at 530 A.M.  Call this number if you have problems the morning of surgery:  [phone]   Remember:  Do not eat food or drink liquids after midnight.  Take these medicines the morning of surgery with A SIP OF WATER   ATENOLOL(TENORMIN), BUSPAR, LEVOTHYROXINE (STOP MULTIVITAMIN) How to Manage Your Diabetes Before Surgery   Why is it important to control my blood sugar before and after surgery?   Improving blood sugar levels before and after surgery helps healing and can limit problems.  A way of improving blood sugar control is eating a healthy diet by:  - Eating less sugar and carbohydrates  - Increasing activity/exercise  - Talk with your doctor about reaching your blood sugar goals  High blood sugars (greater than 180 mg/dL) can raise your risk of infections and slow down your recovery so you will need to focus on controlling your diabetes during the weeks before surgery.  Make sure that the doctor who takes care of your diabetes knows about your planned surgery including the date and location.  How do I manage my blood sugars before surgery?   Check your blood sugar at least 4 times a day, 2 days before surgery to make sure that they are not too high or low.   Check your blood sugar the morning of your surgery when you wake up and every 2                hours until you get to the Short-Stay unit.  If your blood sugar is less than 70 mg/dL, you will need to treat for low blood sugar by:  Treat a low blood sugar (less than 70 mg/dL) with 1/2 cup of clear juice (cranberry or apple), 4 glucose tablets, OR glucose gel.  Recheck blood sugar in 15 minutes after treatment (to make sure it is greater than 70 mg/dL).  If blood sugar is not greater than 70 mg/dL on re-check, call [phone] for further instructions.   Report your blood sugar to the Short-Stay nurse when you get to Short-Stay.  References:  [facility], 2007 "How to Manage your Diabetes Before and After Surgery".  What do I do about my diabetes medications?   Do not take oral diabetes medicines (pills) the morning of surgery.  THE NIGHT BEFORE SURGERY, take 14  units of  NPH  Insulin.    THE MORNING OF SURGERY  IF BLOOD SUGAR IS GREATER THAN 220, YOU MAY TAKE 12 UNITS OF HUMALOG INSULIN.    Do not take other diabetes injectables the day of surgery including Byetta, Victoza, Bydureon, and Trulicity.    If your CBG is greater than 220 mg/dL, you may take 1/2 of your sliding scale (correction) dose of insulin.   For patients with "Insulin Pumps":  Contact your diabetes doctor for specific  instructions before surgery.   Decrease basal insulin rates by 20% at midnight the night before surgery.  Note that if your surgery is planned to be longer than 2 hours, your insulin pump will be removed and intravenous (IV) insulin will be started and managed by the nurses and anesthesiologist.  You will be able to restart your insulin pump once you are awake and able to manage it.  Make sure to bring insulin pump supplies to the hospital with you in case your site needs to be changed.        Do not wear jewelry, make-up or nail polish.  Do not wear lotions, powders, or perfumes.  You may wear deodorant.  Do not shave 48 hours prior to surgery.  Men  may shave face and neck.  Do not bring valuables to the hospital.  [facility] is not responsible for any belongings or valuables.  Contacts, dentures or bridgework may not be worn into surgery.  Leave your suitcase in the car.  After surgery it may be brought to your room.  For patients admitted to the hospital, discharge time will be determined by your treatment team.  Patients discharged the day of surgery will not be allowed to drive home.   Name and phone number of your driver:    Special instructions:  SEE PREPARING FOR SURGERY  Please read over the following fact sheets that you were given. Pain Booklet, Coughing and Deep Breathing, Blood Transfusion Information, MRSA Information and Surgical Site Infection Prevention

## 2015-03-15 LAB — HEMOGLOBIN A1C
Hgb A1c MFr Bld: 6 % — ABNORMAL HIGH (ref 4.8–5.6)
Mean Plasma Glucose: 126 mg/dL

## 2015-03-15 MED ORDER — DEXTROSE 5 % IV SOLN
1.5000 g | INTRAVENOUS | Status: AC
  Administered 2015-03-16: 1.5 g via INTRAVENOUS
  Filled 2015-03-15: qty 1.5

## 2015-03-16 ENCOUNTER — Encounter: Payer: Self-pay | Admitting: Anatomic Pathology & Clinical Pathology

## 2015-03-16 ENCOUNTER — Encounter (HOSPITAL_COMMUNITY): Payer: Self-pay | Admitting: *Deleted

## 2015-03-16 ENCOUNTER — Inpatient Hospital Stay (HOSPITAL_COMMUNITY)
Admission: RE | Admit: 2015-03-16 | Discharge: 2015-03-20 | DRG: 164 | Disposition: A | Discharge status: Home / Self Care | Payer: Medicare Other | Source: Ambulatory Visit | Attending: Cardiothoracic Surgery | Admitting: Cardiothoracic Surgery

## 2015-03-16 ENCOUNTER — Inpatient Hospital Stay (HOSPITAL_COMMUNITY): Payer: Medicare Other | Admitting: Vascular Surgery

## 2015-03-16 ENCOUNTER — Encounter (HOSPITAL_COMMUNITY)
Admission: RE | Disposition: A | Discharge status: Home / Self Care | Payer: Self-pay | Source: Ambulatory Visit | Attending: Cardiothoracic Surgery

## 2015-03-16 ENCOUNTER — Inpatient Hospital Stay (HOSPITAL_COMMUNITY): Payer: Medicare Other

## 2015-03-16 ENCOUNTER — Inpatient Hospital Stay (HOSPITAL_COMMUNITY): Payer: Medicare Other | Admitting: Anesthesiology

## 2015-03-16 DIAGNOSIS — Z87891 Personal history of nicotine dependence: Secondary | ICD-10-CM | POA: Diagnosis not present

## 2015-03-16 DIAGNOSIS — Z9689 Presence of other specified functional implants: Secondary | ICD-10-CM

## 2015-03-16 DIAGNOSIS — D62 Acute posthemorrhagic anemia: Secondary | ICD-10-CM | POA: Diagnosis not present

## 2015-03-16 DIAGNOSIS — R918 Other nonspecific abnormal finding of lung field: Secondary | ICD-10-CM | POA: Diagnosis not present

## 2015-03-16 DIAGNOSIS — Z9889 Other specified postprocedural states: Secondary | ICD-10-CM

## 2015-03-16 DIAGNOSIS — J90 Pleural effusion, not elsewhere classified: Secondary | ICD-10-CM | POA: Diagnosis not present

## 2015-03-16 DIAGNOSIS — M810 Age-related osteoporosis without current pathological fracture: Secondary | ICD-10-CM | POA: Diagnosis present

## 2015-03-16 DIAGNOSIS — E890 Postprocedural hypothyroidism: Secondary | ICD-10-CM | POA: Diagnosis present

## 2015-03-16 DIAGNOSIS — C3412 Malignant neoplasm of upper lobe, left bronchus or lung: Principal | ICD-10-CM | POA: Diagnosis present

## 2015-03-16 DIAGNOSIS — E1021 Type 1 diabetes mellitus with diabetic nephropathy: Secondary | ICD-10-CM | POA: Diagnosis not present

## 2015-03-16 DIAGNOSIS — Z902 Acquired absence of lung [part of]: Secondary | ICD-10-CM

## 2015-03-16 DIAGNOSIS — Z794 Long term (current) use of insulin: Secondary | ICD-10-CM

## 2015-03-16 DIAGNOSIS — R0602 Shortness of breath: Secondary | ICD-10-CM | POA: Diagnosis not present

## 2015-03-16 DIAGNOSIS — I10 Essential (primary) hypertension: Secondary | ICD-10-CM | POA: Diagnosis present

## 2015-03-16 DIAGNOSIS — Z8546 Personal history of malignant neoplasm of prostate: Secondary | ICD-10-CM

## 2015-03-16 DIAGNOSIS — J9811 Atelectasis: Secondary | ICD-10-CM | POA: Diagnosis not present

## 2015-03-16 HISTORY — PX: THOROCOTOMY WITH LOBECTOMY: SHX6118

## 2015-03-16 HISTORY — PX: VIDEO ASSISTED THORACOSCOPY: SHX5073

## 2015-03-16 LAB — BLOOD GAS, ARTERIAL
Acid-base deficit: 4.6 mmol/L — ABNORMAL HIGH (ref 0.0–2.0)
Acid-base deficit: 5.8 mmol/L — ABNORMAL HIGH (ref 0.0–2.0)
Bicarbonate: 19.8 mEq/L — ABNORMAL LOW (ref 20.0–24.0)
Bicarbonate: 19.5 mEq/L — ABNORMAL LOW (ref 20.0–24.0)
FIO2: 0.21
FIO2: 0.28
O2 Saturation: 95.9 %
O2 Saturation: 98.6 %
Patient temperature: 98.6
Patient temperature: 98.6
TCO2: 20.9 mmol/L (ref 0–100)
TCO2: 20.7 mmol/L (ref 0–100)
pCO2 arterial: 35.6 mmHg (ref 35.0–45.0)
pCO2 arterial: 40.7 mmHg (ref 35.0–45.0)
pH, Arterial: 7.364 (ref 7.350–7.450)
pH, Arterial: 7.302 — ABNORMAL LOW (ref 7.350–7.450)
pO2, Arterial: 86.8 mmHg (ref 80.0–100.0)
pO2, Arterial: 161 mmHg — ABNORMAL HIGH (ref 80.0–100.0)

## 2015-03-16 LAB — GLUCOSE, CAPILLARY
Glucose-Capillary: 135 mg/dL — ABNORMAL HIGH (ref 65–99)
Glucose-Capillary: 156 mg/dL — ABNORMAL HIGH (ref 65–99)
Glucose-Capillary: 188 mg/dL — ABNORMAL HIGH (ref 65–99)
Glucose-Capillary: 143 mg/dL — ABNORMAL HIGH (ref 65–99)

## 2015-03-16 LAB — CBC
HCT: 35 % — ABNORMAL LOW (ref 39.0–52.0)
Hemoglobin: 11.4 g/dL — ABNORMAL LOW (ref 13.0–17.0)
MCH: 29.9 pg (ref 26.0–34.0)
MCHC: 32.6 g/dL (ref 30.0–36.0)
MCV: 91.9 fL (ref 78.0–100.0)
Platelets: 183 10*3/uL (ref 150–400)
RBC: 3.81 MIL/uL — ABNORMAL LOW (ref 4.22–5.81)
RDW: 14.5 % (ref 11.5–15.5)
WBC: 6.5 10*3/uL (ref 4.0–10.5)

## 2015-03-16 SURGERY — VIDEO ASSISTED THORACOSCOPY
Anesthesia: General | Site: Chest | Laterality: Left

## 2015-03-16 MED ORDER — VANCOMYCIN HCL IN DEXTROSE 1-5 GM/200ML-% IV SOLN
1000.0000 mg | Freq: Two times a day (BID) | INTRAVENOUS | Status: AC
  Administered 2015-03-16: 1000 mg via INTRAVENOUS
  Filled 2015-03-16 (×2): qty 200

## 2015-03-16 MED ORDER — PHENYLEPHRINE 40 MCG/ML (10ML) SYRINGE FOR IV PUSH (FOR BLOOD PRESSURE SUPPORT)
PREFILLED_SYRINGE | INTRAVENOUS | Status: AC
  Filled 2015-03-16: qty 10

## 2015-03-16 MED ORDER — BUSPIRONE HCL 15 MG PO TABS
15.0000 mg | ORAL_TABLET | Freq: Two times a day (BID) | ORAL | Status: DC
  Administered 2015-03-16 – 2015-03-20 (×8): 15 mg via ORAL
  Filled 2015-03-16 (×9): qty 1

## 2015-03-16 MED ORDER — ONDANSETRON HCL 4 MG/2ML IJ SOLN: 4.0000 mg | Freq: Four times a day (QID) | INTRAMUSCULAR | Status: DC | PRN

## 2015-03-16 MED ORDER — ROCURONIUM BROMIDE 50 MG/5ML IV SOLN
INTRAVENOUS | Status: AC
  Filled 2015-03-16: qty 1

## 2015-03-16 MED ORDER — KETOROLAC TROMETHAMINE 30 MG/ML IJ SOLN
INTRAMUSCULAR | Status: DC | PRN
  Administered 2015-03-16: 30 mg via INTRAVENOUS

## 2015-03-16 MED ORDER — HYDROCHLOROTHIAZIDE 12.5 MG PO CAPS
12.5000 mg | ORAL_CAPSULE | Freq: Every day | ORAL | Status: DC
  Administered 2015-03-17 – 2015-03-20 (×3): 12.5 mg via ORAL
  Filled 2015-03-16 (×7): qty 1

## 2015-03-16 MED ORDER — ACETAMINOPHEN 500 MG PO TABS
1000.0000 mg | ORAL_TABLET | Freq: Four times a day (QID) | ORAL | Status: DC
  Administered 2015-03-16 – 2015-03-19 (×10): 1000 mg via ORAL
  Filled 2015-03-16 (×13): qty 2

## 2015-03-16 MED ORDER — PHENYLEPHRINE HCL 10 MG/ML IJ SOLN
INTRAMUSCULAR | Status: DC | PRN
  Administered 2015-03-16 (×2): 40 ug via INTRAVENOUS

## 2015-03-16 MED ORDER — FENTANYL CITRATE (PF) 250 MCG/5ML IJ SOLN
INTRAMUSCULAR | Status: AC
  Filled 2015-03-16: qty 5

## 2015-03-16 MED ORDER — LISINOPRIL-HYDROCHLOROTHIAZIDE 20-12.5 MG PO TABS: 1.0000 | ORAL_TABLET | Freq: Every day | ORAL | Status: DC

## 2015-03-16 MED ORDER — KETOROLAC TROMETHAMINE 30 MG/ML IJ SOLN
INTRAMUSCULAR | Status: AC
  Filled 2015-03-16: qty 1

## 2015-03-16 MED ORDER — METOCLOPRAMIDE HCL 5 MG/ML IJ SOLN
10.0000 mg | Freq: Four times a day (QID) | INTRAMUSCULAR | Status: DC
  Administered 2015-03-16: 10 mg via INTRAVENOUS
  Filled 2015-03-16: qty 2

## 2015-03-16 MED ORDER — GLYCOPYRROLATE 0.2 MG/ML IJ SOLN
INTRAMUSCULAR | Status: DC | PRN
  Administered 2015-03-16: .8 mg via INTRAVENOUS

## 2015-03-16 MED ORDER — CALCIUM CARBONATE 1250 (500 CA) MG PO TABS
1250.0000 mg | ORAL_TABLET | Freq: Two times a day (BID) | ORAL | Status: DC
  Administered 2015-03-16 – 2015-03-20 (×8): 1250 mg via ORAL
  Filled 2015-03-16 (×6): qty 1
  Filled 2015-03-16: qty 3
  Filled 2015-03-16 (×2): qty 1

## 2015-03-16 MED ORDER — MIDAZOLAM HCL 2 MG/2ML IJ SOLN
INTRAMUSCULAR | Status: AC
  Filled 2015-03-16: qty 4

## 2015-03-16 MED ORDER — SODIUM CHLORIDE 0.9 % IJ SOLN: 9.0000 mL | INTRAMUSCULAR | Status: DC | PRN

## 2015-03-16 MED ORDER — OXYCODONE HCL 5 MG PO TABS
5.0000 mg | ORAL_TABLET | ORAL | Status: DC | PRN
  Administered 2015-03-19 – 2015-03-20 (×4): 10 mg via ORAL
  Filled 2015-03-16 (×4): qty 2

## 2015-03-16 MED ORDER — DIPHENHYDRAMINE HCL 12.5 MG/5ML PO ELIX
12.5000 mg | ORAL_SOLUTION | Freq: Four times a day (QID) | ORAL | Status: DC | PRN
  Filled 2015-03-16: qty 5

## 2015-03-16 MED ORDER — DEXTROSE 5 % IV SOLN
1.5000 g | Freq: Two times a day (BID) | INTRAVENOUS | Status: AC
  Administered 2015-03-16 – 2015-03-17 (×2): 1.5 g via INTRAVENOUS
  Filled 2015-03-16 (×2): qty 1.5

## 2015-03-16 MED ORDER — INSULIN ASPART 100 UNIT/ML ~~LOC~~ SOLN
0.0000 [IU] | Freq: Three times a day (TID) | SUBCUTANEOUS | Status: DC
  Administered 2015-03-16: 2 [IU] via SUBCUTANEOUS
  Administered 2015-03-16: 4 [IU] via SUBCUTANEOUS
  Administered 2015-03-17 (×2): 8 [IU] via SUBCUTANEOUS
  Administered 2015-03-17: 2 [IU] via SUBCUTANEOUS
  Administered 2015-03-17 – 2015-03-18 (×2): 4 [IU] via SUBCUTANEOUS
  Administered 2015-03-18: 8 [IU] via SUBCUTANEOUS
  Administered 2015-03-18: 4 [IU] via SUBCUTANEOUS
  Administered 2015-03-18 – 2015-03-19 (×2): 8 [IU] via SUBCUTANEOUS
  Administered 2015-03-19: 4 [IU] via SUBCUTANEOUS
  Administered 2015-03-20: 2 [IU] via SUBCUTANEOUS
  Administered 2015-03-20: 4 [IU] via SUBCUTANEOUS

## 2015-03-16 MED ORDER — ARTIFICIAL TEARS OP OINT
TOPICAL_OINTMENT | OPHTHALMIC | Status: DC | PRN
  Administered 2015-03-16: 1 via OPHTHALMIC

## 2015-03-16 MED ORDER — VITAMIN D 1000 UNITS PO TABS
1000.0000 [IU] | ORAL_TABLET | Freq: Two times a day (BID) | ORAL | Status: DC
  Administered 2015-03-16 – 2015-03-20 (×8): 1000 [IU] via ORAL
  Filled 2015-03-16 (×10): qty 1

## 2015-03-16 MED ORDER — SENNOSIDES-DOCUSATE SODIUM 8.6-50 MG PO TABS
1.0000 | ORAL_TABLET | Freq: Every day | ORAL | Status: DC
  Filled 2015-03-16: qty 1

## 2015-03-16 MED ORDER — MIDAZOLAM HCL 5 MG/5ML IJ SOLN
INTRAMUSCULAR | Status: DC | PRN
  Administered 2015-03-16 (×2): 1 mg via INTRAVENOUS

## 2015-03-16 MED ORDER — PROMETHAZINE HCL 25 MG/ML IJ SOLN: 6.2500 mg | INTRAMUSCULAR | Status: DC | PRN

## 2015-03-16 MED ORDER — LIDOCAINE HCL (CARDIAC) 20 MG/ML IV SOLN
INTRAVENOUS | Status: AC
  Filled 2015-03-16: qty 5

## 2015-03-16 MED ORDER — VECURONIUM BROMIDE 10 MG IV SOLR
INTRAVENOUS | Status: AC
  Filled 2015-03-16: qty 10

## 2015-03-16 MED ORDER — HYDROMORPHONE HCL 1 MG/ML IJ SOLN
INTRAMUSCULAR | Status: AC
  Filled 2015-03-16: qty 1

## 2015-03-16 MED ORDER — TRAMADOL HCL 50 MG PO TABS
50.0000 mg | ORAL_TABLET | Freq: Four times a day (QID) | ORAL | Status: DC | PRN
  Administered 2015-03-16 – 2015-03-19 (×2): 50 mg via ORAL
  Filled 2015-03-16 (×2): qty 1

## 2015-03-16 MED ORDER — DIPHENHYDRAMINE HCL 50 MG/ML IJ SOLN: 12.5000 mg | Freq: Four times a day (QID) | INTRAMUSCULAR | Status: DC | PRN

## 2015-03-16 MED ORDER — POTASSIUM CHLORIDE 10 MEQ/50ML IV SOLN: 10.0000 meq | Freq: Every day | INTRAVENOUS | Status: DC | PRN

## 2015-03-16 MED ORDER — PHENYLEPHRINE HCL 10 MG/ML IJ SOLN
10.0000 mg | INTRAVENOUS | Status: DC | PRN
  Administered 2015-03-16: 10 ug/min via INTRAVENOUS

## 2015-03-16 MED ORDER — FENTANYL 10 MCG/ML IV SOLN
INTRAVENOUS | Status: DC
  Administered 2015-03-16: 165 ug via INTRAVENOUS
  Administered 2015-03-16 – 2015-03-17 (×2): via INTRAVENOUS
  Administered 2015-03-17: 75 ug via INTRAVENOUS
  Administered 2015-03-17: 45 ug via INTRAVENOUS
  Administered 2015-03-17: 75 ug via INTRAVENOUS
  Administered 2015-03-18: 45 ug via INTRAVENOUS
  Administered 2015-03-18: 15 ug via INTRAVENOUS
  Administered 2015-03-18: 45 ug via INTRAVENOUS
  Administered 2015-03-18: 30 ug via INTRAVENOUS
  Administered 2015-03-18: 18:00:00 via INTRAVENOUS
  Administered 2015-03-18: 30 ug via INTRAVENOUS
  Administered 2015-03-18: 63 ug via INTRAVENOUS
  Administered 2015-03-19: 30 ug via INTRAVENOUS
  Administered 2015-03-19: 15 ug via INTRAVENOUS
  Filled 2015-03-16 (×3): qty 50

## 2015-03-16 MED ORDER — FENTANYL CITRATE (PF) 100 MCG/2ML IJ SOLN
INTRAMUSCULAR | Status: DC | PRN
  Administered 2015-03-16 (×2): 100 ug via INTRAVENOUS
  Administered 2015-03-16: 50 ug via INTRAVENOUS
  Administered 2015-03-16: 100 ug via INTRAVENOUS

## 2015-03-16 MED ORDER — EPHEDRINE SULFATE 50 MG/ML IJ SOLN
INTRAMUSCULAR | Status: DC | PRN
  Administered 2015-03-16 (×2): 5 mg via INTRAVENOUS
  Administered 2015-03-16: 10 mg via INTRAVENOUS
  Administered 2015-03-16: 5 mg via INTRAVENOUS

## 2015-03-16 MED ORDER — ROCURONIUM BROMIDE 100 MG/10ML IV SOLN
INTRAVENOUS | Status: DC | PRN
  Administered 2015-03-16: 30 mg via INTRAVENOUS
  Administered 2015-03-16: 20 mg via INTRAVENOUS
  Administered 2015-03-16: 50 mg via INTRAVENOUS

## 2015-03-16 MED ORDER — LISINOPRIL 10 MG PO TABS
20.0000 mg | ORAL_TABLET | Freq: Every day | ORAL | Status: DC
  Administered 2015-03-17 – 2015-03-20 (×3): 20 mg via ORAL
  Filled 2015-03-16: qty 2
  Filled 2015-03-16 (×4): qty 1
  Filled 2015-03-16: qty 2

## 2015-03-16 MED ORDER — VECURONIUM BROMIDE 10 MG IV SOLR
INTRAVENOUS | Status: DC | PRN
  Administered 2015-03-16: 1 mg via INTRAVENOUS
  Administered 2015-03-16: 2 mg via INTRAVENOUS

## 2015-03-16 MED ORDER — LACTATED RINGERS IV SOLN
INTRAVENOUS | Status: DC | PRN
  Administered 2015-03-16 (×2): via INTRAVENOUS

## 2015-03-16 MED ORDER — SODIUM CHLORIDE 0.9 % IJ SOLN
INTRAMUSCULAR | Status: AC
  Filled 2015-03-16: qty 10

## 2015-03-16 MED ORDER — NALOXONE HCL 0.4 MG/ML IJ SOLN: 0.4000 mg | INTRAMUSCULAR | Status: DC | PRN

## 2015-03-16 MED ORDER — BISACODYL 5 MG PO TBEC
10.0000 mg | DELAYED_RELEASE_TABLET | Freq: Every day | ORAL | Status: DC
  Filled 2015-03-16: qty 2

## 2015-03-16 MED ORDER — ATENOLOL 25 MG PO TABS
12.5000 mg | ORAL_TABLET | Freq: Two times a day (BID) | ORAL | Status: DC
  Administered 2015-03-17 – 2015-03-20 (×6): 12.5 mg via ORAL
  Filled 2015-03-16 (×8): qty 1

## 2015-03-16 MED ORDER — ACETAMINOPHEN 160 MG/5ML PO SOLN: 1000.0000 mg | Freq: Four times a day (QID) | ORAL | Status: DC

## 2015-03-16 MED ORDER — GLYCOPYRROLATE 0.2 MG/ML IJ SOLN
INTRAMUSCULAR | Status: AC
  Filled 2015-03-16: qty 4

## 2015-03-16 MED ORDER — 0.9 % SODIUM CHLORIDE (POUR BTL) OPTIME
TOPICAL | Status: DC | PRN
  Administered 2015-03-16: 2000 mL

## 2015-03-16 MED ORDER — NEOSTIGMINE METHYLSULFATE 10 MG/10ML IV SOLN
INTRAVENOUS | Status: AC
  Filled 2015-03-16: qty 1

## 2015-03-16 MED ORDER — LIDOCAINE HCL (CARDIAC) 20 MG/ML IV SOLN
INTRAVENOUS | Status: DC | PRN
  Administered 2015-03-16: 70 mg via INTRAVENOUS

## 2015-03-16 MED ORDER — PROPOFOL 10 MG/ML IV BOLUS
INTRAVENOUS | Status: AC
  Filled 2015-03-16: qty 20

## 2015-03-16 MED ORDER — HEMOSTATIC AGENTS (NO CHARGE) OPTIME
TOPICAL | Status: DC | PRN
  Administered 2015-03-16: 2 via TOPICAL

## 2015-03-16 MED ORDER — NEOSTIGMINE METHYLSULFATE 10 MG/10ML IV SOLN
INTRAVENOUS | Status: DC | PRN
  Administered 2015-03-16: 5 mg via INTRAVENOUS

## 2015-03-16 MED ORDER — ONDANSETRON HCL 4 MG/2ML IJ SOLN
INTRAMUSCULAR | Status: DC | PRN
Start: 2015-03-16 — End: 2015-03-16
  Administered 2015-03-16: 4 mg via INTRAVENOUS

## 2015-03-16 MED ORDER — EPHEDRINE SULFATE 50 MG/ML IJ SOLN
INTRAMUSCULAR | Status: AC
  Filled 2015-03-16: qty 1

## 2015-03-16 MED ORDER — LEVOTHYROXINE SODIUM 25 MCG PO TABS
175.0000 ug | ORAL_TABLET | Freq: Every day | ORAL | Status: DC
  Administered 2015-03-17 – 2015-03-20 (×4): 175 ug via ORAL
  Filled 2015-03-16 (×4): qty 1

## 2015-03-16 MED ORDER — LEVALBUTEROL HCL 0.63 MG/3ML IN NEBU
0.6300 mg | INHALATION_SOLUTION | Freq: Four times a day (QID) | RESPIRATORY_TRACT | Status: DC
  Administered 2015-03-16 – 2015-03-18 (×7): 0.63 mg via RESPIRATORY_TRACT
  Filled 2015-03-16 (×14): qty 3

## 2015-03-16 MED ORDER — MESALAMINE 400 MG PO CPDR
1600.0000 mg | DELAYED_RELEASE_CAPSULE | Freq: Two times a day (BID) | ORAL | Status: DC
  Administered 2015-03-16: 800 mg via ORAL
  Filled 2015-03-16 (×2): qty 4

## 2015-03-16 MED ORDER — SODIUM CHLORIDE 0.45 % IV SOLN
INTRAVENOUS | Status: DC
  Administered 2015-03-16: 100 mL via INTRAVENOUS
  Administered 2015-03-17: 01:00:00 via INTRAVENOUS

## 2015-03-16 MED ORDER — PROPOFOL 10 MG/ML IV BOLUS
INTRAVENOUS | Status: DC | PRN
  Administered 2015-03-16: 200 mg via INTRAVENOUS

## 2015-03-16 MED ORDER — ADULT MULTIVITAMIN W/MINERALS CH
1.0000 | ORAL_TABLET | Freq: Every day | ORAL | Status: DC
  Administered 2015-03-17 – 2015-03-20 (×4): 1 via ORAL
  Filled 2015-03-16 (×5): qty 1

## 2015-03-16 MED ORDER — HYDROMORPHONE HCL 1 MG/ML IJ SOLN
0.2500 mg | INTRAMUSCULAR | Status: DC | PRN
  Administered 2015-03-16 (×2): 0.5 mg via INTRAVENOUS

## 2015-03-16 SURGICAL SUPPLY — 74 items
ADH SKN CLS APL DERMABOND .7 (GAUZE/BANDAGES/DRESSINGS) ×3
APPLIER CLIP ROT 10 11.4 M/L (STAPLE) ×5
APR CLP MED LRG 11.4X10 (STAPLE) ×3
BAG DECANTER FOR FLEXI CONT (MISCELLANEOUS) IMPLANT
BLADE SURG 11 STRL SS (BLADE) ×5 IMPLANT
CANISTER SUCTION 2500CC (MISCELLANEOUS) ×5 IMPLANT
CATH KIT ON Q 5IN SLV (PAIN MANAGEMENT) IMPLANT
CATH ROBINSON RED A/P 22FR (CATHETERS) IMPLANT
CATH THORACIC 28FR (CATHETERS) ×5 IMPLANT
CATH THORACIC 36FR (CATHETERS) IMPLANT
CATH THORACIC 36FR RT ANG (CATHETERS) IMPLANT
CLIP APPLIE ROT 10 11.4 M/L (STAPLE) ×3 IMPLANT
CONT SPEC 4OZ CLIKSEAL STRL BL (MISCELLANEOUS) ×20 IMPLANT
COVER SURGICAL LIGHT HANDLE (MISCELLANEOUS) IMPLANT
DERMABOND ADVANCED (GAUZE/BANDAGES/DRESSINGS) ×2
DERMABOND ADVANCED .7 DNX12 (GAUZE/BANDAGES/DRESSINGS) ×3 IMPLANT
DRAIN CHANNEL 32F RND 10.7 FF (WOUND CARE) ×5 IMPLANT
DRAPE LAPAROSCOPIC ABDOMINAL (DRAPES) ×5 IMPLANT
DRAPE WARM FLUID 44X44 (DRAPE) IMPLANT
ELECT REM PT RETURN 9FT ADLT (ELECTROSURGICAL) ×5
ELECTRODE REM PT RTRN 9FT ADLT (ELECTROSURGICAL) ×3 IMPLANT
GAUZE SPONGE 4X4 12PLY STRL (GAUZE/BANDAGES/DRESSINGS) ×5 IMPLANT
GLOVE BIO SURGEON STRL SZ7.5 (GLOVE) ×10 IMPLANT
GLOVE BIOGEL PI IND STRL 7.0 (GLOVE) ×9 IMPLANT
GLOVE BIOGEL PI INDICATOR 7.0 (GLOVE) ×6
GOWN STRL REUS W/ TWL LRG LVL3 (GOWN DISPOSABLE) ×12 IMPLANT
GOWN STRL REUS W/TWL LRG LVL3 (GOWN DISPOSABLE) ×20
KIT BASIN OR (CUSTOM PROCEDURE TRAY) ×5 IMPLANT
KIT ROOM TURNOVER OR (KITS) ×5 IMPLANT
KIT SUCTION CATH 14FR (SUCTIONS) ×5 IMPLANT
NS IRRIG 1000ML POUR BTL (IV SOLUTION) ×10 IMPLANT
PACK CHEST (CUSTOM PROCEDURE TRAY) ×5 IMPLANT
PAD ARMBOARD 7.5X6 YLW CONV (MISCELLANEOUS) ×10 IMPLANT
RELOAD GOLD ECHELON 45 (STAPLE) ×10 IMPLANT
RELOAD STAPLER LINE PROX 30 GR (STAPLE) ×3 IMPLANT
SEALANT SURG COSEAL 4ML (VASCULAR PRODUCTS) ×10 IMPLANT
SOLUTION ANTI FOG 6CC (MISCELLANEOUS) ×5 IMPLANT
SPONGE GAUZE 4X4 12PLY STER LF (GAUZE/BANDAGES/DRESSINGS) ×5 IMPLANT
SPONGE INTESTINAL PEANUT (DISPOSABLE) ×10 IMPLANT
SPONGE TONSIL 1 RF SGL (DISPOSABLE) ×15 IMPLANT
STAPLE RELOAD 2.5MM WHITE (STAPLE) ×30 IMPLANT
STAPLER ECHELON POWERED (MISCELLANEOUS) ×5 IMPLANT
STAPLER RELOAD LINE PROX 30 GR (STAPLE) ×5
STAPLER VASCULAR ECHELON 35 (CUTTER) ×5 IMPLANT
SUT CHROMIC 3 0 SH 27 (SUTURE) ×5 IMPLANT
SUT ETHILON 3 0 PS 1 (SUTURE) IMPLANT
SUT PROLENE 3 0 SH DA (SUTURE) IMPLANT
SUT PROLENE 4 0 RB 1 (SUTURE)
SUT PROLENE 4-0 RB1 .5 CRCL 36 (SUTURE) IMPLANT
SUT SILK  1 MH (SUTURE) ×4
SUT SILK 1 MH (SUTURE) ×6 IMPLANT
SUT SILK 2 0SH CR/8 30 (SUTURE) IMPLANT
SUT SILK 3 0SH CR/8 30 (SUTURE) IMPLANT
SUT VIC AB 1 CTX 18 (SUTURE) ×25 IMPLANT
SUT VIC AB 2 TP1 27 (SUTURE) IMPLANT
SUT VIC AB 2-0 CT1 27 (SUTURE) ×5
SUT VIC AB 2-0 CT1 TAPERPNT 27 (SUTURE) ×3 IMPLANT
SUT VIC AB 2-0 CT2 18 VCP726D (SUTURE) IMPLANT
SUT VIC AB 2-0 CTX 36 (SUTURE) ×5 IMPLANT
SUT VIC AB 3-0 SH 18 (SUTURE) IMPLANT
SUT VIC AB 3-0 X1 27 (SUTURE) ×5 IMPLANT
SUT VICRYL 0 UR6 27IN ABS (SUTURE) IMPLANT
SUT VICRYL 2 TP 1 (SUTURE) ×5 IMPLANT
SWAB COLLECTION DEVICE MRSA (MISCELLANEOUS) IMPLANT
SYSTEM SAHARA CHEST DRAIN ATS (WOUND CARE) ×5 IMPLANT
TAPE CLOTH SURG 4X10 WHT LF (GAUZE/BANDAGES/DRESSINGS) ×5 IMPLANT
TIP APPLICATOR SPRAY EXTEND 16 (VASCULAR PRODUCTS) IMPLANT
TOWEL OR 17X24 6PK STRL BLUE (TOWEL DISPOSABLE) ×5 IMPLANT
TOWEL OR 17X26 10 PK STRL BLUE (TOWEL DISPOSABLE) ×10 IMPLANT
TRAP SPECIMEN MUCOUS 40CC (MISCELLANEOUS) IMPLANT
TRAY FOLEY CATH 16FRSI W/METER (SET/KITS/TRAYS/PACK) ×5 IMPLANT
TUBE ANAEROBIC SPECIMEN COL (MISCELLANEOUS) IMPLANT
WATER STERILE IRR 1000ML POUR (IV SOLUTION) ×10 IMPLANT
YANKAUER SUCT BULB TIP NO VENT (SUCTIONS) ×5 IMPLANT

## 2015-03-16 NOTE — Anesthesia Preprocedure Evaluation (Addendum)
Anesthesia Evaluation  Patient identified by MRN, date of birth, ID band Patient awake    Reviewed: Allergy & Precautions, NPO status , Patient's Chart, lab work & pertinent test results  History of Anesthesia Complications Negative for: history of anesthetic complications  Airway Mallampati: II  TM Distance: >3 FB Neck ROM: Full    Dental  (+) Teeth Intact, Dental Advisory Given   Pulmonary former smoker,    Pulmonary exam normal        Cardiovascular hypertension, Pt. on medications and Pt. on home beta blockers Normal cardiovascular exam  Cardiology Procedures-Noninvasive: treadmill cardiolite September 2004; Cardiac Cath Results: no significant disease; LVEF of 60% documented via cardiac cath on 02/17/2003,    Neuro/Psych negative neurological ROS  negative psych ROS   GI/Hepatic negative GI ROS, Neg liver ROS,   Endo/Other  diabetes  Renal/GU Renal InsufficiencyRenal disease     Musculoskeletal   Abdominal   Peds  Hematology   Anesthesia Other Findings   Reproductive/Obstetrics                          Anesthesia Physical Anesthesia Plan  ASA: III  Anesthesia Plan: General   Post-op Pain Management:    Induction: Intravenous  Airway Management Planned: Double Lumen EBT  Additional Equipment: Arterial line  Intra-op Plan:   Post-operative Plan: Possible Post-op intubation/ventilation  Informed Consent: I have reviewed the patients History and Physical, chart, labs and discussed the procedure including the risks, benefits and alternatives for the proposed anesthesia with the patient or authorized representative who has indicated his/her understanding and acceptance.   Dental advisory given  Plan Discussed with: CRNA, Anesthesiologist and Surgeon  Anesthesia Plan Comments:        Anesthesia Quick Evaluation

## 2015-03-16 NOTE — Anesthesia Postprocedure Evaluation (Signed)
Anesthesia Post Note  Patient: Devin [name]  Procedure(s) Performed: Procedure(s) (LRB): VIDEO ASSISTED THORACOSCOPY (Left) THOROCOTOMY WITH LOBECTOMY  Anesthesia type: general  Patient location: PACU  Post pain: Pain level controlled  Post assessment: Patient's Cardiovascular Status Stable  Last Vitals:  Filed Vitals:   03/16/15 1326  BP:   Pulse: 79  Temp: 36.4 C  Resp: 13    Post vital signs: Reviewed and stable  Level of consciousness: sedated  Complications: No apparent anesthesia complications

## 2015-03-16 NOTE — Transfer of Care (Signed)
Immediate Anesthesia Transfer of Care Note  Patient: Devin [name]  Procedure(s) Performed: Procedure(s): VIDEO ASSISTED THORACOSCOPY (Left) THOROCOTOMY WITH LOBECTOMY  Patient Location: PACU  Anesthesia Type:General  Level of Consciousness: lethargic and responds to stimulation  Airway & Oxygen Therapy: Patient Spontanous Breathing and Patient connected to nasal cannula oxygen  Post-op Assessment: Report given to RN  Post vital signs: Reviewed and stable  Last Vitals:  Filed Vitals:   03/16/15 0803  BP:   Pulse: 75  Temp:   Resp: 12    Complications: No apparent anesthesia complications

## 2015-03-16 NOTE — Progress Notes (Addendum)
Title of Study: PATHOLOGY PROCUREMENT  Description: Customer service manager for the Discovery and Validation of Biomarkers for the Prediction, Diagnosis and Management of Disease  Principal Investigator: Enid Cutter, MD  Study Coordinator: Rodena Goldmann  IRB #: [id number]  Met with Mr. Luberto for 15 minutes to review IRB# [id number]. 1 family member present, wife [name]). The patient is eligible and qualifies for this study. Reviewed the consent/HIPAA form in detail and explained the purpose of the study, study procedures, potential risks, potential benefits, and alternatives to participation. All of the patient's questions were answered. The patient agreed to take part in the study and signed the consent/HIPAA document. Enrollment procedures completed. The patient was given a copy of the signed informed consent. Consent form to be loaded in Media Tab (03/16/15 Procedure Note - DonorConsent.pdf)   Leftover tissue obtained by PA  Site: Lung Gross Measurement: N/A  Weight, segment 1: approximately 818m  Weight, segment 2: approximately 3362m [place] [phone]

## 2015-03-16 NOTE — Anesthesia Procedure Notes (Addendum)
Procedure Name: Intubation Date/Time: 03/16/2015 8:25 AM Performed by: Sampson Si E Pre-anesthesia Checklist: Patient identified, Emergency Drugs available, Suction available, Patient being monitored and Timeout performed Patient Re-evaluated:Patient Re-evaluated prior to inductionOxygen Delivery Method: Circle system utilized Preoxygenation: Pre-oxygenation with 100% oxygen Intubation Type: IV induction Ventilation: Mask ventilation without difficulty and Oral airway inserted - appropriate to patient size Laryngoscope Size: Mac and 3 Grade View: Grade II Tube type: Oral Endobronchial tube: Double lumen EBT and Left and 39 Fr Number of attempts: 1 Airway Equipment and Method: Stylet and Fiberoptic brochoscope Placement Confirmation: ETT inserted through vocal cords under direct vision,  positive ETCO2,  CO2 detector and breath sounds checked- equal and bilateral Secured at: 33 cm Tube secured with: Tape Dental Injury: Teeth and Oropharynx as per pre-operative assessment  Comments: Intubation by Charlett Lango, SRNA, supervised by CRNA and MDA      The patient was identified and consent obtained.  TO was performed, and full barrier precautions were used.  The skin was anesthetized with lidocaine.  Once the vein was located with the 22 ga., the wire was inserted into the vein. The insertion site was dilated and the CVL was carefully inserted and sutured in place. The patient tolerated the procedure well.  CXR was ordered for PACU.Ultrasound guidance: relevant anatomy identified, needle position confirmed, vessel patent, wire seen inside the vessel.  Images printed for the medical record.  Start: 0705 End: 0715 J. Tedra Senegal, MD

## 2015-03-16 NOTE — Plan of Care (Signed)
Problem: Phase II Progression Outcomes Goal: Weaning O2 for sats > or equal to 88% Outcome: Progressing Pt on 2l O2 Goal: Pain controlled Outcome: Completed/Met Date Met:  03/16/15 Pt with PCA

## 2015-03-16 NOTE — Brief Op Note (Signed)
03/16/2015  11:25 AM  PATIENT:  Devin [name]  74 y.o. male  PRE-OPERATIVE DIAGNOSIS:  LUL LUNG CANCER  POST-OPERATIVE DIAGNOSIS:  LUL LUNG CANCER  PROCEDURE:  Procedure(s):  VIDEO ASSISTED THORACOSCOPY/THORACOTOMY (Left) -Left Upper Lobe -Lymph Node Dissection  SURGEON:  Surgeon(s) and Role:    * Ivin Poot, MD - Primary  PHYSICIAN ASSISTANT: Ellwood Handler PA-C  ANESTHESIA:   general  EBL:  Total I/O In: 1500 [I.V.:1500] Out: 275 [Urine:275]  BLOOD ADMINISTERED:none  DRAINS: 32 Blake, 28 Straight   LOCAL MEDICATIONS USED:  NONE  SPECIMEN:  Source of Specimen:  Left Upper Lobe, Lymph Nodes (hilar)  DISPOSITION OF SPECIMEN:  PATHOLOGY  COUNTS:  YES  TOURNIQUET:  * No tourniquets in log *  DICTATION: .Dragon Dictation  PLAN OF CARE: Admit to inpatient   PATIENT DISPOSITION:  ICU - intubated and hemodynamically stable.   Delay start of Pharmacological VTE agent (>24hrs) due to surgical blood loss or risk of bleeding: yes

## 2015-03-17 ENCOUNTER — Inpatient Hospital Stay (HOSPITAL_COMMUNITY): Payer: Medicare Other

## 2015-03-17 ENCOUNTER — Encounter (HOSPITAL_COMMUNITY): Payer: Self-pay | Admitting: Cardiothoracic Surgery

## 2015-03-17 LAB — GLUCOSE, CAPILLARY
Glucose-Capillary: 152 mg/dL — ABNORMAL HIGH (ref 65–99)
Glucose-Capillary: 178 mg/dL — ABNORMAL HIGH (ref 65–99)
Glucose-Capillary: 212 mg/dL — ABNORMAL HIGH (ref 65–99)
Glucose-Capillary: 217 mg/dL — ABNORMAL HIGH (ref 65–99)

## 2015-03-17 LAB — BASIC METABOLIC PANEL
Anion gap: 10 (ref 5–15)
BUN: 15 mg/dL (ref 6–20)
CO2: 24 mmol/L (ref 22–32)
Calcium: 8.5 mg/dL — ABNORMAL LOW (ref 8.9–10.3)
Chloride: 103 mmol/L (ref 101–111)
Creatinine, Ser: 1.19 mg/dL (ref 0.61–1.24)
GFR calc Af Amer: >60 mL/min (ref >60)
GFR calc non Af Amer: 59 mL/min — ABNORMAL LOW (ref >60)
Glucose, Bld: 155 mg/dL — ABNORMAL HIGH (ref 65–99)
Potassium: 4.1 mmol/L (ref 3.5–5.1)
Sodium: 137 mmol/L (ref 135–145)

## 2015-03-17 LAB — BLOOD GAS, ARTERIAL
Acid-base deficit: 3.6 mmol/L — ABNORMAL HIGH (ref 0.0–2.0)
Bicarbonate: 21.6 mEq/L (ref 20.0–24.0)
Drawn by: 41977
O2 Content: 2 L/min
O2 Saturation: 98.5 %
Patient temperature: 98.6
TCO2: 23 mmol/L (ref 0–100)
pCO2 arterial: 44.4 mmHg (ref 35.0–45.0)
pH, Arterial: 7.309 — ABNORMAL LOW (ref 7.350–7.450)
pO2, Arterial: 137 mmHg — ABNORMAL HIGH (ref 80.0–100.0)

## 2015-03-17 LAB — POCT I-STAT 3, ART BLOOD GAS (G3+)
Acid-base deficit: 3 mmol/L — ABNORMAL HIGH (ref 0.0–2.0)
Bicarbonate: 22.9 mEq/L (ref 20.0–24.0)
O2 Saturation: 98 %
Patient temperature: 97.5
TCO2: 24 mmol/L (ref 0–100)
pCO2 arterial: 43.4 mmHg (ref 35.0–45.0)
pH, Arterial: 7.328 — ABNORMAL LOW (ref 7.350–7.450)
pO2, Arterial: 117 mmHg — ABNORMAL HIGH (ref 80.0–100.0)

## 2015-03-17 LAB — CBC
HCT: 34.4 % — ABNORMAL LOW (ref 39.0–52.0)
Hemoglobin: 11.3 g/dL — ABNORMAL LOW (ref 13.0–17.0)
MCH: 30.6 pg (ref 26.0–34.0)
MCHC: 32.8 g/dL (ref 30.0–36.0)
MCV: 93.2 fL (ref 78.0–100.0)
Platelets: 183 10*3/uL (ref 150–400)
RBC: 3.69 MIL/uL — ABNORMAL LOW (ref 4.22–5.81)
RDW: 14.6 % (ref 11.5–15.5)
WBC: 10.1 10*3/uL (ref 4.0–10.5)

## 2015-03-17 MED ORDER — ALUM & MAG HYDROXIDE-SIMETH 200-200-20 MG/5ML PO SUSP
30.0000 mL | Freq: Four times a day (QID) | ORAL | Status: DC | PRN
  Filled 2015-03-17: qty 30

## 2015-03-17 MED ORDER — MESALAMINE 400 MG PO CPDR
800.0000 mg | DELAYED_RELEASE_CAPSULE | Freq: Two times a day (BID) | ORAL | Status: DC
  Administered 2015-03-17 – 2015-03-20 (×7): 800 mg via ORAL
  Filled 2015-03-17 (×8): qty 2

## 2015-03-17 MED ORDER — GUAIFENESIN ER 600 MG PO TB12
600.0000 mg | ORAL_TABLET | Freq: Two times a day (BID) | ORAL | Status: DC
  Administered 2015-03-17 – 2015-03-20 (×6): 600 mg via ORAL
  Filled 2015-03-17 (×8): qty 1

## 2015-03-17 MED ORDER — FAMOTIDINE 20 MG PO TABS
20.0000 mg | ORAL_TABLET | Freq: Two times a day (BID) | ORAL | Status: DC
  Administered 2015-03-18 – 2015-03-20 (×5): 20 mg via ORAL
  Filled 2015-03-17 (×8): qty 1

## 2015-03-17 NOTE — Progress Notes (Signed)
1 Day Post-Op Procedure(s) (LRB): VIDEO ASSISTED THORACOSCOPY (Left) THOROCOTOMY WITH LOBECTOMY Subjective:  No complaints  Objective: Vital signs in last 24 hours: Temp:  [97.5 F (36.4 C)-98.2 F (36.8 C)] 97.6 F (36.4 C) (10/07 0731) Pulse Rate:  [76-102] 84 (10/07 0800) Cardiac Rhythm:  [-] Normal sinus rhythm (10/07 0823) Resp:  [12-27] 22 (10/07 0800) BP: (127-152)/(49-74) 137/50 mmHg (10/07 0800) SpO2:  [97 %-100 %] 100 % (10/07 0949) Arterial Line BP: (123-188)/(36-69) 130/47 mmHg (10/07 0800) Weight:  [101.8 kg (224 lb 6.9 oz)] 101.8 kg (224 lb 6.9 oz) (10/07 0600)  Hemodynamic parameters for last 24 hours:    Intake/Output from previous day: 10/06 0701 - 10/07 0700 In: 3801.7 [P.O.:360; I.V.:3191.7; IV Piggyback:250] Out: 0156 [Urine:1445; Chest Tube:310] Intake/Output this shift: Total I/O In: 100 [I.V.:100] Out: 40 [Urine:40]  General appearance: alert and cooperative Heart: regular rate and rhythm, S1, S2 normal, no murmur, click, rub or gallop Lungs: clear to auscultation bilaterally Extremities: extremities normal, atraumatic, no cyanosis or edema Wound: dressing dry No air leak from chest tube  Lab Results:  Recent Labs  03/16/15 0629 03/17/15 0522  WBC 6.5 10.1  HGB 11.4* 11.3*  HCT 35.0* 34.4*  PLT 183 183   BMET:  Recent Labs  03/14/15 1539 03/17/15 0522  NA 136 137  K 4.9 4.1  CL 108 103  CO2 17* 24  GLUCOSE 126* 155*  BUN 18 15  CREATININE 1.33* 1.19  CALCIUM 9.7 8.5*    PT/INR:  Recent Labs  03/14/15 1539  LABPROT 14.1  INR 1.07   ABG    Component Value Date/Time   PHART 7.309* 03/17/2015 0540   HCO3 21.6 03/17/2015 0540   TCO2 23.0 03/17/2015 0540   ACIDBASEDEF 3.6* 03/17/2015 0540   O2SAT 98.5 03/17/2015 0540   CBG (last 3)   Recent Labs  03/16/15 1605 03/16/15 2200 03/17/15 0729  GLUCAP 188* 143* 152*   CLINICAL DATA: Chest tube.  EXAM: PORTABLE CHEST 1 VIEW  COMPARISON:  None.  FINDINGS: Two left chest tubes and right IJ line stable position. No pneumothorax. Heart size normal. Bibasilar atelectasis. No pleural effusion or pneumothorax.  IMPRESSION: 1. 2 left chest tubes and right IJ line in stable position. No pneumothorax . 2. Stable bibasilar subsegmental atelectasis.   Electronically Signed  By: Marcello Moores Register  On: 03/17/2015 07:57  Assessment/Plan: S/P Procedure(s) (LRB): VIDEO ASSISTED THORACOSCOPY (Left) THOROCOTOMY WITH LOBECTOMY  He has been hemodynamically stable postop. DC arterial line. Chest tubes to water seal Mobilize and IS.   LOS: 1 day    Gaye Pollack 03/17/2015

## 2015-03-17 NOTE — Progress Notes (Signed)
Patient ID: Devin [name], male   DOB: 08-17-1940, 74 y.o.   MRN: [id number] SICU Evening Rounds:  Hemodynamically stable sats 95% No problems today

## 2015-03-18 ENCOUNTER — Inpatient Hospital Stay (HOSPITAL_COMMUNITY): Payer: Medicare Other

## 2015-03-18 LAB — COMPREHENSIVE METABOLIC PANEL
ALT: 24 U/L (ref 17–63)
AST: 30 U/L (ref 15–41)
Albumin: 3 g/dL — ABNORMAL LOW (ref 3.5–5.0)
Alkaline Phosphatase: 75 U/L (ref 38–126)
Anion gap: 9 (ref 5–15)
BUN: 12 mg/dL (ref 6–20)
CO2: 27 mmol/L (ref 22–32)
Calcium: 9.1 mg/dL (ref 8.9–10.3)
Chloride: 102 mmol/L (ref 101–111)
Creatinine, Ser: 1.08 mg/dL (ref 0.61–1.24)
GFR calc Af Amer: >60 mL/min (ref >60)
GFR calc non Af Amer: >60 mL/min (ref >60)
Glucose, Bld: 151 mg/dL — ABNORMAL HIGH (ref 65–99)
Potassium: 3.6 mmol/L (ref 3.5–5.1)
Sodium: 138 mmol/L (ref 135–145)
Total Bilirubin: 0.4 mg/dL (ref 0.3–1.2)
Total Protein: 5.7 g/dL — ABNORMAL LOW (ref 6.5–8.1)

## 2015-03-18 LAB — GLUCOSE, CAPILLARY
Glucose-Capillary: 181 mg/dL — ABNORMAL HIGH (ref 65–99)
Glucose-Capillary: 224 mg/dL — ABNORMAL HIGH (ref 65–99)
Glucose-Capillary: 172 mg/dL — ABNORMAL HIGH (ref 65–99)
Glucose-Capillary: 207 mg/dL — ABNORMAL HIGH (ref 65–99)

## 2015-03-18 LAB — CBC
HCT: 34.6 % — ABNORMAL LOW (ref 39.0–52.0)
Hemoglobin: 11.4 g/dL — ABNORMAL LOW (ref 13.0–17.0)
MCH: 30.7 pg (ref 26.0–34.0)
MCHC: 32.9 g/dL (ref 30.0–36.0)
MCV: 93.3 fL (ref 78.0–100.0)
Platelets: 192 10*3/uL (ref 150–400)
RBC: 3.71 MIL/uL — ABNORMAL LOW (ref 4.22–5.81)
RDW: 14.6 % (ref 11.5–15.5)
WBC: 10.4 10*3/uL (ref 4.0–10.5)

## 2015-03-18 MED ORDER — LEVALBUTEROL HCL 0.63 MG/3ML IN NEBU
0.6300 mg | INHALATION_SOLUTION | Freq: Two times a day (BID) | RESPIRATORY_TRACT | Status: DC
  Administered 2015-03-18 – 2015-03-19 (×3): 0.63 mg via RESPIRATORY_TRACT
  Filled 2015-03-18 (×5): qty 3

## 2015-03-18 MED ORDER — POTASSIUM CHLORIDE 10 MEQ/50ML IV SOLN
10.0000 meq | INTRAVENOUS | Status: AC
  Administered 2015-03-18 (×3): 10 meq via INTRAVENOUS
  Filled 2015-03-18 (×3): qty 50

## 2015-03-18 MED ORDER — INSULIN DETEMIR 100 UNIT/ML ~~LOC~~ SOLN
15.0000 [IU] | Freq: Every day | SUBCUTANEOUS | Status: DC
  Administered 2015-03-18 – 2015-03-20 (×3): 15 [IU] via SUBCUTANEOUS
  Filled 2015-03-18 (×3): qty 0.15

## 2015-03-18 MED ORDER — LEVALBUTEROL HCL 0.63 MG/3ML IN NEBU: 0.6300 mg | INHALATION_SOLUTION | Freq: Four times a day (QID) | RESPIRATORY_TRACT | Status: DC | PRN

## 2015-03-18 NOTE — Progress Notes (Signed)
2 Days Post-Op Procedure(s) (LRB): VIDEO ASSISTED THORACOSCOPY (Left) THOROCOTOMY WITH LOBECTOMY Subjective:  No complaints  Objective: Vital signs in last 24 hours: Temp:  [97.7 F (36.5 C)-98.5 F (36.9 C)] 98.3 F (36.8 C) (10/08 0719) Pulse Rate:  [87-115] 93 (10/08 0700) Cardiac Rhythm:  [-] Normal sinus rhythm (10/08 0100) Resp:  [13-25] 23 (10/08 0700) BP: (98-162)/(51-80) 126/54 mmHg (10/08 0700) SpO2:  [94 %-100 %] 96 % (10/08 0825) Arterial Line BP: (126-137)/(45-54) 137/54 mmHg (10/07 1000) Weight:  [100.7 kg (222 lb 0.1 oz)] 100.7 kg (222 lb 0.1 oz) (10/08 0600)  Hemodynamic parameters for last 24 hours:    Intake/Output from previous day: 10/07 0701 - 10/08 0700 In: 621 [I.V.:621] Out: 2500 [Urine:2240; Chest Tube:260] Intake/Output this shift:    General appearance: alert and cooperative Neurologic: intact Heart: regular rate and rhythm, S1, S2 normal, no murmur, click, rub or gallop Lungs: clear to auscultation bilaterally Wound: dressing dry no air leak from chest tubes  Lab Results:  Recent Labs  03/17/15 0522 03/18/15 0420  WBC 10.1 10.4  HGB 11.3* 11.4*  HCT 34.4* 34.6*  PLT 183 192   BMET:  Recent Labs  03/17/15 0522 03/18/15 0420  NA 137 138  K 4.1 3.6  CL 103 102  CO2 24 27  GLUCOSE 155* 151*  BUN 15 12  CREATININE 1.19 1.08  CALCIUM 8.5* 9.1    PT/INR: No results for input(s): LABPROT, INR in the last 72 hours. ABG    Component Value Date/Time   PHART 7.309* 03/17/2015 0540   HCO3 21.6 03/17/2015 0540   TCO2 23.0 03/17/2015 0540   ACIDBASEDEF 3.6* 03/17/2015 0540   O2SAT 98.5 03/17/2015 0540   CBG (last 3)   Recent Labs  03/17/15 1742 03/17/15 2119 03/18/15 0717  GLUCAP 212* 217* 181*    Assessment/Plan: S/P Procedure(s) (LRB): VIDEO ASSISTED THORACOSCOPY (Left) THOROCOTOMY WITH LOBECTOMY  He is doing well. Will remove posterior chest tube today and the other tube tomorrow.   DC foley  Glucose still a  little high on SSI. Will add some Levemir.  Continue IS, ambulation.   LOS: 2 days    Gaye Pollack 03/18/2015

## 2015-03-19 ENCOUNTER — Inpatient Hospital Stay (HOSPITAL_COMMUNITY): Payer: Medicare Other

## 2015-03-19 LAB — GLUCOSE, CAPILLARY
Glucose-Capillary: 115 mg/dL — ABNORMAL HIGH (ref 65–99)
Glucose-Capillary: 174 mg/dL — ABNORMAL HIGH (ref 65–99)
Glucose-Capillary: 208 mg/dL — ABNORMAL HIGH (ref 65–99)
Glucose-Capillary: 108 mg/dL — ABNORMAL HIGH (ref 65–99)

## 2015-03-19 MED ORDER — LUNG SURGERY BOOK
Freq: Once | Status: AC
  Administered 2015-03-19: 07:00:00
  Filled 2015-03-19: qty 1

## 2015-03-19 NOTE — Discharge Instructions (Signed)
Thoracotomy, Care After  These instructions give you information on caring for yourself after your procedure. Your doctor may also give you more specific instructions. Call your doctor if you have any problems or questions after your procedure. HOME CARE  Only take medicine as told by your doctor. Take pain medicine before your pain gets very bad.  Take deep breaths to protect yourself from a lung infection (pneumonia).  Cough often to clear thick spit (mucus) and to open your lungs. Hold a pillow against your chest if it hurts to cough.  Use your breathing machine (incentive spirometer) as told.  Change bandages (dressings) as told by your doctor.  Take off the bandage over your chest tube area as told by your doctor.  Continue your normal diet as told by your doctor.  Eat high-fiber foods. This includes whole grain cereals, brown rice, beans, and fresh fruits and vegetables.  Drink enough fluids to keep your pee (urine) clear or pale yellow. Avoid caffeine.  Talk to your doctor about taking a medicine to soften your poop (stool softener, laxative).  Keep doing activities as told by your doctor. Balance your activity with rest.  Avoid lifting until your doctor says it is okay.  Do not drive until told by your doctor. Do not drive while taking pain medicines (narcotics).  Do not bathe, swim, or use a hot tub until told by your doctor. You may shower. Gently wash the area of your cut (incision) with soap and water as told.  Do not use any tobacco products including cigarettes, chewing tobacco, and electronic cigarettes. Avoid being around others who smoke.  Schedule a doctor visit to get your stitches or staples removed as told.  Schedule and go to all doctor visits as told.  Go to therapy that can help improve your lungs (pulmonary rehabilitation) as told by your doctor.  Do not travel by airplane for 2 weeks after the chest tube is taken out. GET HELP IF:  You are bleeding  from your wounds.  You have redness, puffiness (swelling), or more pain in the wounds.  You feel your heart beating fast or skipping beats (irregular heartbeat).  You have yellowish-white fluid (pus) coming from your wounds.  You have a bad smell coming from the wound or bandage.  You have a fever or chills.  You feel sick to your stomach or you throw up (vomit).  You have muscle aches. GET HELP RIGHT AWAY IF:  You have a rash.  You have trouble breathing.  Your medicines are causing you problems.  You keep feeling sick to your stomach (nauseous).  You feel light-headed.  You have shortness of breath or chest pain.  You have pain that will not go away. MAKE SURE YOU:  Understand these instructions.  Will watch your condition.  Will get help right away if you are not doing well or get worse.   This information is not intended to replace advice given to you by your health care provider. Make sure you discuss any questions you have with your health care provider.   Document Released: 11/26/2011 Document Revised: 06/17/2014 Document Reviewed: 01/13/2013 Elsevier Interactive Patient Education [employer].

## 2015-03-19 NOTE — Progress Notes (Signed)
40 mls of fentanyl wasted with Ruel Favors, RN in med room.  Rowe Pavy, RN
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
## 2015-03-19 NOTE — Progress Notes (Signed)
Report called to Gae Bon, RN on 2W, pt tx'd to 2W14, belongings at bedside, SCDs in room, pt placed on 2W monitor, receiving RN in room, no further questions from patient or RN.  Rowe Pavy, RN

## 2015-03-19 NOTE — Progress Notes (Signed)
3 Days Post-Op Procedure(s) (LRB): VIDEO ASSISTED THORACOSCOPY (Left) THOROCOTOMY WITH LOBECTOMY Subjective:  No complaints. Walking well  Objective: Vital signs in last 24 hours: Temp:  [97.9 F (36.6 C)-98.9 F (37.2 C)] 98.3 F (36.8 C) (10/09 0713) Pulse Rate:  [82-109] 85 (10/09 0800) Cardiac Rhythm:  [-] Normal sinus rhythm (10/09 0800) Resp:  [14-26] 17 (10/09 0800) BP: (110-145)/(38-61) 134/52 mmHg (10/09 0800) SpO2:  [92 %-100 %] 100 % (10/09 0800) Weight:  [100.5 kg (221 lb 9 oz)] 100.5 kg (221 lb 9 oz) (10/09 0200)  Hemodynamic parameters for last 24 hours:    Intake/Output from previous day: 10/08 0701 - 10/09 0700 In: 880 [P.O.:490; I.V.:240; IV Piggyback:150] Out: 1180 [Urine:1040; Chest Tube:140] Intake/Output this shift: Total I/O In: 10 [I.V.:10] Out: -   General appearance: alert and cooperative Heart: regular rate and rhythm, S1, S2 normal, no murmur, click, rub or gallop Lungs: clear to auscultation bilaterally chest tube output serous, no air leak  Lab Results:  Recent Labs  03/17/15 0522 03/18/15 0420  WBC 10.1 10.4  HGB 11.3* 11.4*  HCT 34.4* 34.6*  PLT 183 192   BMET:  Recent Labs  03/17/15 0522 03/18/15 0420  NA 137 138  K 4.1 3.6  CL 103 102  CO2 24 27  GLUCOSE 155* 151*  BUN 15 12  CREATININE 1.19 1.08  CALCIUM 8.5* 9.1    PT/INR: No results for input(s): LABPROT, INR in the last 72 hours. ABG    Component Value Date/Time   PHART 7.309* 03/17/2015 0540   HCO3 21.6 03/17/2015 0540   TCO2 23.0 03/17/2015 0540   ACIDBASEDEF 3.6* 03/17/2015 0540   O2SAT 98.5 03/17/2015 0540   CBG (last 3)   Recent Labs  03/18/15 1636 03/18/15 2120 03/19/15 0711  GLUCAP 172* 207* 115*   CXR: ok. No ptx  Assessment/Plan: S/P Procedure(s) (LRB): VIDEO ASSISTED THORACOSCOPY (Left) THOROCOTOMY WITH LOBECTOMY  He is doing well. Remove remaining chest tube and transfer to 2W. Continue IS and ambulation and plan home tomorrow if  no changes. Will check CXR in the am.   LOS: 3 days    Gaye Pollack 03/19/2015

## 2015-03-19 NOTE — Progress Notes (Signed)
Pt received to 2W14, ambulating pushing wheelchair. Oriented to room and call system. UP in chair with call bell and phone in reach.

## 2015-03-20 ENCOUNTER — Inpatient Hospital Stay (HOSPITAL_COMMUNITY): Payer: Medicare Other

## 2015-03-20 LAB — GLUCOSE, CAPILLARY
Glucose-Capillary: 132 mg/dL — ABNORMAL HIGH (ref 65–99)
Glucose-Capillary: 165 mg/dL — ABNORMAL HIGH (ref 65–99)

## 2015-03-20 MED ORDER — OXYCODONE HCL 5 MG PO TABS: 5.0000 mg | ORAL_TABLET | ORAL | Status: DC | PRN

## 2015-03-20 NOTE — Care Management Important Message (Signed)
Important Message  Patient Details  Name: Devin [name] MRN: [id number] Date of Birth: 1940/07/23   Medicare Important Message Given:  Yes-second notification given    Nathen May 03/20/2015, 11:27 AM

## 2015-03-20 NOTE — Care Management Note (Signed)
Case Management Note Devin Gibbons RN, BSN Unit 2W-Case Manager [phone]  Patient Details  Name: Devin [name] MRN: [id number] Date of Birth: [birth year]  Subjective/Objective:      Pt admitted s/p lobectomy              Action/Plan: PTA pt lived at home- plan to return home - referral received regarding hospital bed- spoke with pt at bedside - per conversation pt states that he does not want a hospital bed at this point- states that at first he felt like he might need once but has since changed his mind and no longer feels like he needs it and does not want a hospital bed for home. No further CM needs identified pt to return home.   Expected Discharge Date:       03/20/15           Expected Discharge Plan:  Home/Self Care  In-House Referral:     Discharge planning Services  CM Consult  Post Acute Care Choice:    Choice offered to:     DME Arranged:    DME Agency:     HH Arranged:    HH Agency:     Status of Service:  Completed, signed off  Medicare Important Message Given:    Date Medicare IM Given:    Medicare IM give by:    Date Additional Medicare IM Given:    Additional Medicare Important Message give by:     If discussed at [place] of Stay Meetings, dates discussed:    Additional Comments:  Dawayne Patricia, RN 03/20/2015, 11:08 AM

## 2015-03-20 NOTE — Progress Notes (Signed)
[address]       [place] [address]             [phone]      4 Days Post-Op Procedure(s) (LRB): VIDEO ASSISTED THORACOSCOPY (Left) THOROCOTOMY WITH LOBECTOMY Subjective: Feels well  Objective: Vital signs in last 24 hours: Temp:  [98.4 F (36.9 C)-98.7 F (37.1 C)] 98.5 F (36.9 C) (10/10 0448) Pulse Rate:  [81-104] 81 (10/10 0448) Cardiac Rhythm:  [-] Normal sinus rhythm (10/09 2011) Resp:  [18-21] 18 (10/10 0448) BP: (126-136)/(43-63) 129/63 mmHg (10/10 0448) SpO2:  [95 %-99 %] 95 % (10/10 0448)  Hemodynamic parameters for last 24 hours:    Intake/Output from previous day: 10/09 0701 - 10/10 0700 In: 490 [P.O.:480; I.V.:10] Out: -  Intake/Output this shift:    General appearance: alert, cooperative and no distress Heart: regular rate and rhythm Lungs: clear to auscultation bilaterally Abdomen: benign Extremities: no edema of calf tenderness Wound: + echymosis, no evid of infection  Lab Results:  Recent Labs  03/18/15 0420  WBC 10.4  HGB 11.4*  HCT 34.6*  PLT 192   BMET:  Recent Labs  03/18/15 0420  NA 138  K 3.6  CL 102  CO2 27  GLUCOSE 151*  BUN 12  CREATININE 1.08  CALCIUM 9.1    PT/INR: No results for input(s): LABPROT, INR in the last 72 hours. ABG    Component Value Date/Time   PHART 7.309* 03/17/2015 0540   HCO3 21.6 03/17/2015 0540   TCO2 23.0 03/17/2015 0540   ACIDBASEDEF 3.6* 03/17/2015 0540   O2SAT 98.5 03/17/2015 0540   CBG (last 3)   Recent Labs  03/19/15 1651 03/19/15 2121 03/20/15 0640  GLUCAP 208* 108* 132*    Meds Scheduled Meds: . atenolol  12.5 mg Oral BID  . bisacodyl  10 mg Oral Daily  . busPIRone  15 mg Oral BID  . calcium carbonate  1,250 mg Oral BID  . cholecalciferol  1,000 Units Oral BID  . famotidine  20 mg Oral BID  . guaiFENesin  600 mg Oral BID  . lisinopril  20 mg Oral Daily   And  . hydrochlorothiazide  12.5 mg Oral Daily  . insulin aspart  0-24 Units  Subcutaneous TID AC & HS  . insulin detemir  15 Units Subcutaneous Daily  . levalbuterol  0.63 mg Nebulization BID  . levothyroxine  175 mcg Oral QAC breakfast  . Mesalamine  800 mg Oral BID  . multivitamin with minerals  1 tablet Oral Daily   Continuous Infusions:  PRN Meds:.alum & mag hydroxide-simeth, levalbuterol, oxyCODONE, traMADol  Xrays Dg Chest 2 View  03/20/2015   CLINICAL DATA:  Postoperative thoracotomy. Status post chest tube removal  EXAM: CHEST  2 VIEW  COMPARISON:  March 19, 2015  FINDINGS: Remaining chest tube on the left has been removed. No apparent pneumothorax. Central catheter tip is in the superior cava near the cavoatrial junction. There is mild bibasilar atelectatic change, slightly more on the right than on the left. There is a small loculated effusion on the left. There is no frank edema or consolidation. Heart size and pulmonary vascularity are within normal limits. There is postoperative change in the upper midline mediastinal region as well as in the lower thoracic and lumbar regions. There is degenerative change in the thoracic spine.  IMPRESSION: No demonstrable pneumothorax. Small loculated effusion on the left. Patchy bibasilar atelectasis, slightly more on the right than on the  left. No airspace consolidation appreciable.   Electronically Signed   By: Lowella Grip III M.D.   On: 03/20/2015 07:19   Dg Chest Port 1 View  03/19/2015   CLINICAL DATA:  Followup thoracotomy.  Shortness of breath.  EXAM: PORTABLE CHEST 1 VIEW  COMPARISON:  03/18/2015  FINDINGS: One of the 2 left chest tubes has been removed. No pneumothorax. Right internal jugular central line is unchanged with the tip at the SVC RA junction. Basilar atelectasis persists.  IMPRESSION: One of the 2 left chest tubes has been removed.  No pneumothorax.   Electronically Signed   By: Nelson Chimes M.D.   On: 03/19/2015 07:28    Assessment/Plan: S/P Procedure(s) (LRB): VIDEO ASSISTED THORACOSCOPY  (Left) THOROCOTOMY WITH LOBECTOMY Plan for discharge: see discharge orders   LOS: 4 days    Kiira Brach E 03/20/2015

## 2015-03-20 NOTE — Progress Notes (Signed)
Discharged home with wife, IV site Right neck IJ resmove, NSL  Left hand removed, instructions given to patient and wife, questions answered.  Mervyn Skeeters, RN

## 2015-03-24 ENCOUNTER — Ambulatory Visit (INDEPENDENT_AMBULATORY_CARE_PROVIDER_SITE_OTHER): Payer: Self-pay | Admitting: *Deleted

## 2015-03-24 ENCOUNTER — Encounter: Payer: Self-pay | Admitting: *Deleted

## 2015-03-24 DIAGNOSIS — C3490 Malignant neoplasm of unspecified part of unspecified bronchus or lung: Secondary | ICD-10-CM

## 2015-03-24 DIAGNOSIS — Z4802 Encounter for removal of sutures: Secondary | ICD-10-CM

## 2015-03-24 DIAGNOSIS — Z902 Acquired absence of lung [part of]: Secondary | ICD-10-CM

## 2015-03-24 DIAGNOSIS — C3412 Malignant neoplasm of upper lobe, left bronchus or lung: Secondary | ICD-10-CM

## 2015-03-24 NOTE — Progress Notes (Signed)
Devin [name] returns today for the removal of two sutures from previous VATS/CT sites. These sites, as well as the thoracotomy incision, are very well healed.  He has no complaints. Appetite and bowels are good.  He is using minimal pain medication. He will return as scheduled with a cxr.

## 2015-03-28 ENCOUNTER — Ambulatory Visit: Payer: Medicare Other | Admitting: Interventional Cardiology

## 2015-03-28 NOTE — Op Note (Signed)
NAMEMarland [name]  [name], BALDERSON NO.:  [id number]  MEDICAL RECORD NO.:  [id number]  LOCATION:  [id number]                        FACILITY:  [facility]  PHYSICIAN:  Ivin Poot, M.D.  DATE OF BIRTH:  [birth year]  DATE OF PROCEDURE: DATE OF DISCHARGE:  03/20/2015                              OPERATIVE REPORT   OPERATION: 1. Left VATS (video-assisted thoracoscopic surgery) with mini     thoracotomy and left upper lobectomy. 2. Mediastinal lymph node dissection.  SURGEON:  Ivin Poot, M.D.  ASSISTANT:  Providence Crosby PA-C.  ANESTHESIA:  General.  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  PET scan showed no evidence of mediastinal or distant metastatic disease.  The patient had a history of diabetes and hypertension, but his cardiac status was stable and he was felt to be a candidate for pulmonary resection-probable lobectomy for the best chance of cure.  I reviewed his preoperative pulmonary function tests, CT scan, PET scan, and the results of his biopsy with the patient.  I discussed with the patient and family in detail, the indications and benefits of left VATS and lobectomy for treatment of a proven carcinoma.  The patient has significant ankylosing spondylitis, which would present potential difficulties for his postoperative recovery; however, I felt that the risk of lobectomy in this 73 year old gentleman was justified by the best chance for cure.  I discussed the potential risks of the surgery including the risks of postoperative pneumonia, postoperative  air leak, postoperative infection, postoperative stroke or death.  He demonstrated his understanding and agreed to proceed with surgery and what I felt was an informed consent.  DESCRIPTION OF PROCEDURE:  The patient was brought to the operating room, placed supine on the operating table, where general anesthesia was induced.  Under invasive hemodynamic monitoring, the patient was intubated with a double-lumen tube.  He has turned to expose left side up.  The left chest was prepped and draped as a sterile field.  A proper time-out was performed.  A small incision was made at the tip of the scapula and the VATS camera was inserted.  The left hemithorax was inspected.  There were some apical adhesions.  There was no evidence of the tumor, which was not on the surface of the left upper lobe.  There was no evidence of metastatic disease in the chest.  The scope was removed.  A small incision was made in the 5th interspace from the anterior aspect of the scapula anteriorly.  The pleural space was entered and a small retractor was placed.  The lung was mobilized from apical adhesions and the upper lobe was carefully inspected and palpated and the tumor was located.  It was centrally located and will require lobectomy.  The pleura was incised beneath the aortic arch anterior to the main  pulmonary artery and the main left pulmonary artery was dissected.  The 2 branches to the upper lobe were carefully dissected and circled with vessel loops and stapled and divided with the VATS stapling devices. The anterior left upper lobe vein was then isolated after being dissected and circled with the vessel loop, stapled, and divided.  The fissure was then opened and the main pulmonary artery was identified.  The branches to the superior segment of the lower lobe and the basilar segments were identified.  The fissure was completed superiorly, anterior to the pulmonary artery.  The fissure between the  upper lobe and lower lobe was then completed inferiorly, anterior to the pulmonary arteries with the VATS stapling devices.  The left mainstem bronchus was then identified more posteriorly and encircled with a vessel loop.  It was clamped with a TA 30 stapler and the left lower lobe was inflated to confirm there was no impingement on the lower lobe bronchus.  The left lower lobe inflated well.  The staple device was then fired and distal to the staple line, the bronchus was cut and the specimen was removed.  The inferior pulmonary ligament was taken down to mobilize the left lower lobe.  The hilum and staple lines were inspected and found to be hemostatic.  Hilar lymph nodes were removed and sent for separate pathologic analysis.  Warm saline was placed in the left hemothorax and the left lower lobe was inflated and there was no obvious air leak from the bronchus or the staple lines.  The staple lines had been covered with a light layer of biologic adhesive-Coseal.  Two chest tubes were placed, the anterior and posterior left pleural space and brought out through separate incisions and secured to the skin.  The pericostal sutures were placed to reapproximate the ribs. Prior to tiny pericostal sutures, the left lower lobe was inflated under direct vision and filled the space well.  The 2 muscle layers were closed with interrupted #1 Vicryl.  The subcutaneous layer was closed in a running Vicryl and the skin was closed with a subcuticular.  Total estimated blood loss was approximately 200 mL.  There were no known complications.  The patient was turned supine, extubated, and returned to recovery room in a stable condition.     Ivin Poot, M.D.     PV/MEDQ  D:  03/28/2015  T:  03/28/2015  Job:  [id number]

## 2015-03-30 ENCOUNTER — Other Ambulatory Visit: Payer: Self-pay | Admitting: *Deleted

## 2015-03-30 ENCOUNTER — Other Ambulatory Visit: Payer: Self-pay | Admitting: Endocrinology

## 2015-03-30 DIAGNOSIS — G8918 Other acute postprocedural pain: Secondary | ICD-10-CM

## 2015-03-30 MED ORDER — OXYCODONE HCL 5 MG PO TABS: 5.0000 mg | ORAL_TABLET | ORAL | Status: DC | PRN

## 2015-03-30 NOTE — Progress Notes (Signed)
Devin [name] has called requesting a refill for his Oxycodone s/p lobectomy 03/16/15.  I informed him that a new signed script would be available at the office today and he agreed.

## 2015-03-31 ENCOUNTER — Other Ambulatory Visit: Payer: Self-pay | Admitting: Cardiothoracic Surgery

## 2015-03-31 DIAGNOSIS — C3490 Malignant neoplasm of unspecified part of unspecified bronchus or lung: Secondary | ICD-10-CM

## 2015-04-03 ENCOUNTER — Ambulatory Visit
Admission: RE | Admit: 2015-04-03 | Discharge: 2015-04-03 | Disposition: A | Discharge status: Home / Self Care | Payer: Medicare Other | Source: Ambulatory Visit | Attending: Cardiothoracic Surgery | Admitting: Cardiothoracic Surgery

## 2015-04-03 ENCOUNTER — Ambulatory Visit (INDEPENDENT_AMBULATORY_CARE_PROVIDER_SITE_OTHER): Payer: Self-pay | Admitting: Cardiothoracic Surgery

## 2015-04-03 ENCOUNTER — Encounter: Payer: Self-pay | Admitting: Cardiothoracic Surgery

## 2015-04-03 ENCOUNTER — Telehealth: Payer: Self-pay | Admitting: *Deleted

## 2015-04-03 VITALS — BP 119/70 | HR 80 | Resp 20 | Ht 69.0 in | Wt 220.0 lb

## 2015-04-03 DIAGNOSIS — J90 Pleural effusion, not elsewhere classified: Secondary | ICD-10-CM | POA: Diagnosis not present

## 2015-04-03 DIAGNOSIS — Z902 Acquired absence of lung [part of]: Secondary | ICD-10-CM

## 2015-04-03 DIAGNOSIS — C3490 Malignant neoplasm of unspecified part of unspecified bronchus or lung: Secondary | ICD-10-CM

## 2015-04-03 DIAGNOSIS — C3412 Malignant neoplasm of upper lobe, left bronchus or lung: Secondary | ICD-10-CM

## 2015-04-03 NOTE — Progress Notes (Signed)
PCP is Renato Shin, MD Referring Provider is Renato Shin, MD  Chief Complaint  Patient presents with  . 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 Minimal nonproductive cough  The patient has stage IB so he will be evaluated by Dr. Julien Nordmann, medical oncologist for postoperative chemotherapy. I spoke with the pathologist who will send the tumor for Foundation 1 molecular testing.  Past Medical History  Diagnosis Date  . VENTRAL HERNIA 04/14/2008  . Hypocalcemia 04/13/2007  . Impotence   . Psoriasis   . Crohn's disease ([place])   . Obesity (BMI 30-39.9)   . Ankylosing spondylitis ([place])     Diagnosed during lumbar fracture summer of 2016    . Diabetic neuropathy ([place])   . Osteoporosis     Pt completed 5 years of fosamax in 2013     . Gout   . 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 History of thyroid cancer     Treated with RAI and total thyroidectomy 1999    . Prostate cancer with recurrence     Treated with prostatectomy with recurrence 2012 with Lupron treatment now him   . HIstory of basal cell cancer of face     THYROID CA HX    Past Surgical History  Procedure Laterality Date  . Total thyroidectomy  1997  . Tonsillectomy    . Cardiac catheterization  03/17/2003  . Stress cardiolite  02/17/2003  . Venous doppler  05/30/2004  . Hernia repair  2009    ventral hernia  . Prostatectomy    . Laparotomy      for small bowel obstruction  . Video assisted thoracoscopy Left 03/16/2015    Procedure: VIDEO ASSISTED THORACOSCOPY;  Surgeon: Ivin Poot, MD;   Location: [facility];  Service: Thoracic;  Laterality: Left;  . Thorocotomy with lobectomy  03/16/2015    Procedure: THOROCOTOMY WITH LOBECTOMY;  Surgeon: Ivin Poot, MD;  [facility];  Service: Thoracic;;    Family History  Problem Relation Age of Onset  . Cancer Neg Hx     No cancer in the patient's immediate family, except of course for the patient himself, as noted    Social History Social History  Substance Use Topics  . Smoking status: Former Research scientist (life sciences)  . Smokeless tobacco: Never Used  . Alcohol Use: No    Current Outpatient Prescriptions  Medication Sig Dispense Refill  . ASACOL HD 800 MG TBEC Take 1,600 mg by mouth 2 (two) times daily.  3  . atenolol (TENORMIN) 25 MG tablet TAKE 1/2 TABLET BY MOUTH DAILY 45 tablet 0  . B-D ULTRAFINE III SHORT PEN 31G X 8 MM MISC USE FOUR TIMES DAILY AS DIRECTED 400 each 1  . BAYER CONTOUR TEST test strip TEST BLOOD SUGAR TWICE DAILY AS DIRECTED 200 each 1  . busPIRone (BUSPAR) 15 MG tablet TAKE 1 TABLET BY MOUTH TWICE DAILY 180 tablet 0  . calcium carbonate (OS-CAL) 1250 (500 CA) MG chewable tablet Chew 1 tablet by mouth 2 (two) times daily.    . cholecalciferol (VITAMIN D) 1000 UNITS tablet Take 1,000 Units  by mouth 2 (two) times daily.     . Glucose Blood (ASCENSIA CONTOUR TEST VI) Use as directed to test two times a day     . HUMALOG KWIKPEN 100 UNIT/ML KiwkPen INJECT UNDER THE SKIN THREE TIMES DAILY. 25 UNITS, 25 UNITS, AND 30 UNITS RESPECTIVELY BEFORE MEALS. 30 mL 0  . insulin lispro (HUMALOG) 100 UNIT/ML injection before meals 3 times a day 25-25-30 units, and pen needles 4/day 30 mL 12  . insulin NPH (HUMULIN N PEN) 100 UNIT/ML injection Inject 20 Units into the skin at bedtime. 20 mL 1  . Insulin Pen Needle (B-D ULTRAFINE III SHORT PEN) 31G X 8 MM MISC 1 each by Other route 4 (four) times daily. 400 each 1  . leuprolide (LUPRON) 11.25 MG injection Inject 11.25 mg into the muscle every 6 (six) months.     . levothyroxine (SYNTHROID,  LEVOTHROID) 175 MCG tablet TAKE 1 TABLET BY MOUTH EVERY DAY BEFORE BREAKFAST. 90 tablet 0  . lisinopril-hydrochlorothiazide (PRINZIDE,ZESTORETIC) 20-12.5 MG tablet Take 1 tablet by mouth daily.    Marland Kitchen lisinopril-hydrochlorothiazide (PRINZIDE,ZESTORETIC) 20-12.5 MG tablet TAKE 1 TABLET BY MOUTH EVERY DAY 90 tablet 0  . Multiple Vitamins-Minerals (CENTRUM SILVER) tablet Take 1 tablet by mouth daily.    Marland Kitchen oxyCODONE (OXY IR/ROXICODONE) 5 MG immediate release tablet Take 1-2 tablets (5-10 mg total) by mouth every 4 (four) hours as needed for severe pain. 50 tablet 0   No current facility-administered medications for this visit.    No Known Allergies  Review of Systems   Improving strength appetite No fever Anxious to resume driving  BP 257/50 mmHg  Pulse 80  Resp 20  Ht 5' 9"  (1.753 m)  Wt 220 lb (99.791 kg)  BMI 32.47 kg/m2  SpO2 98% Physical Exam Alert and comfortable Lungs clear Surgical incision healing well Heart rate regular No pedal edema or calf tenderness  Diagnostic Tests: Chest x-ray clear With mild postoperative changes  Impression: Doing well after left upper lobectomy, nodal dissection Patient will be seen by medical oncology for stage I B. Adenocarcinoma He'll return in 2 months with chest x-ray to monitor progress. He will need a chest CT scan in 6 months postop which can be done either by Dr. Julien Nordmann or myself.  Plan:he is encouraged to walk 20 minutes daily 5 days a week. Return in 2 months with chest x-ray   Len Childs, MD [facility] [phone]

## 2015-04-03 NOTE — Telephone Encounter (Signed)
Oncology Nurse Navigator Documentation  Oncology Nurse Navigator Flowsheets 04/03/2015  Referral date to RadOnc/MedOnc 04/03/2015  Navigator Encounter Type Telephone/I received a referral for Mr. Berghuis today.  He is not sure why I am calling and stated he will be seeing surgeon today at 3:30.  I asked him to keep that appt for more information.  He hung the phone up on me.  I called [facility] ans spoke with allison to explain.  I gave her an appt time for patient if he would like to have one.    Patient Visit Type Initial  Treatment Phase Treatment  Interventions Coordination of Care  Coordination of Care MD Appointments  Time Spent with Patient 30

## 2015-04-04 ENCOUNTER — Other Ambulatory Visit (HOSPITAL_COMMUNITY)
Admission: RE | Admit: 2015-04-04 | Discharge: 2015-04-04 | Disposition: A | Discharge status: Home / Self Care | Payer: Medicare Other | Source: Ambulatory Visit | Attending: Cardiothoracic Surgery | Admitting: Cardiothoracic Surgery

## 2015-04-04 DIAGNOSIS — C801 Malignant (primary) neoplasm, unspecified: Secondary | ICD-10-CM | POA: Diagnosis not present

## 2015-04-05 ENCOUNTER — Encounter: Payer: Medicare Other | Admitting: Cardiothoracic Surgery

## 2015-04-10 DIAGNOSIS — C61 Malignant neoplasm of prostate: Secondary | ICD-10-CM | POA: Diagnosis not present

## 2015-04-11 ENCOUNTER — Encounter: Payer: Self-pay | Admitting: *Deleted

## 2015-04-11 DIAGNOSIS — S32009A Unspecified fracture of unspecified lumbar vertebra, initial encounter for closed fracture: Secondary | ICD-10-CM | POA: Diagnosis not present

## 2015-04-12 ENCOUNTER — Telehealth: Payer: Self-pay | Admitting: *Deleted

## 2015-04-12 ENCOUNTER — Other Ambulatory Visit: Payer: Self-pay | Admitting: Medical Oncology

## 2015-04-12 DIAGNOSIS — C3490 Malignant neoplasm of unspecified part of unspecified bronchus or lung: Secondary | ICD-10-CM

## 2015-04-12 NOTE — Telephone Encounter (Signed)
Called and spoke w/ pt's wife and confirmed 04/13/15 clinic appt w/ her.

## 2015-04-13 ENCOUNTER — Encounter: Payer: Self-pay | Admitting: Internal Medicine

## 2015-04-13 ENCOUNTER — Ambulatory Visit: Payer: Medicare Other | Attending: Internal Medicine | Admitting: Physical Therapy

## 2015-04-13 ENCOUNTER — Encounter: Payer: Self-pay | Admitting: *Deleted

## 2015-04-13 ENCOUNTER — Other Ambulatory Visit (HOSPITAL_BASED_OUTPATIENT_CLINIC_OR_DEPARTMENT_OTHER): Payer: Medicare Other

## 2015-04-13 ENCOUNTER — Ambulatory Visit (HOSPITAL_BASED_OUTPATIENT_CLINIC_OR_DEPARTMENT_OTHER): Payer: Medicare Other | Admitting: Internal Medicine

## 2015-04-13 ENCOUNTER — Other Ambulatory Visit: Payer: Medicare Other

## 2015-04-13 VITALS — BP 128/55 | HR 87 | Temp 98.6°F | Resp 17 | Ht 69.0 in | Wt 218.4 lb

## 2015-04-13 DIAGNOSIS — R29898 Other symptoms and signs involving the musculoskeletal system: Secondary | ICD-10-CM | POA: Insufficient documentation

## 2015-04-13 DIAGNOSIS — M4003 Postural kyphosis, cervicothoracic region: Secondary | ICD-10-CM | POA: Diagnosis not present

## 2015-04-13 DIAGNOSIS — C3490 Malignant neoplasm of unspecified part of unspecified bronchus or lung: Secondary | ICD-10-CM

## 2015-04-13 DIAGNOSIS — Z85820 Personal history of malignant melanoma of skin: Secondary | ICD-10-CM

## 2015-04-13 DIAGNOSIS — M2569 Stiffness of other specified joint, not elsewhere classified: Secondary | ICD-10-CM

## 2015-04-13 DIAGNOSIS — M2560 Stiffness of unspecified joint, not elsewhere classified: Secondary | ICD-10-CM

## 2015-04-13 DIAGNOSIS — C3412 Malignant neoplasm of upper lobe, left bronchus or lung: Secondary | ICD-10-CM

## 2015-04-13 DIAGNOSIS — Z8589 Personal history of malignant neoplasm of other organs and systems: Secondary | ICD-10-CM

## 2015-04-13 DIAGNOSIS — C61 Malignant neoplasm of prostate: Secondary | ICD-10-CM | POA: Diagnosis not present

## 2015-04-13 DIAGNOSIS — R0602 Shortness of breath: Secondary | ICD-10-CM | POA: Diagnosis not present

## 2015-04-13 DIAGNOSIS — Z87891 Personal history of nicotine dependence: Secondary | ICD-10-CM

## 2015-04-13 DIAGNOSIS — C3492 Malignant neoplasm of unspecified part of left bronchus or lung: Secondary | ICD-10-CM

## 2015-04-13 LAB — CBC WITH DIFFERENTIAL/PLATELET
BASO%: 0.6 % (ref 0.0–2.0)
Basophils Absolute: 0.1 10*3/uL (ref 0.0–0.1)
EOS%: 2.9 % (ref 0.0–7.0)
Eosinophils Absolute: 0.3 10*3/uL (ref 0.0–0.5)
HCT: 41.3 % (ref 38.4–49.9)
HGB: 13.6 g/dL (ref 13.0–17.1)
LYMPH%: 19.6 % (ref 14.0–49.0)
MCH: 30 pg (ref 27.2–33.4)
MCHC: 32.8 g/dL (ref 32.0–36.0)
MCV: 91.5 fL (ref 79.3–98.0)
MONO#: 0.7 10*3/uL (ref 0.1–0.9)
MONO%: 7.1 % (ref 0.0–14.0)
NEUT#: 6.9 10*3/uL — ABNORMAL HIGH (ref 1.5–6.5)
NEUT%: 69.8 % (ref 39.0–75.0)
Platelets: 262 10*3/uL (ref 140–400)
RBC: 4.51 10*6/uL (ref 4.20–5.82)
RDW: 14.8 % — ABNORMAL HIGH (ref 11.0–14.6)
WBC: 9.9 10*3/uL (ref 4.0–10.3)
lymph#: 1.9 10*3/uL (ref 0.9–3.3)

## 2015-04-13 LAB — COMPREHENSIVE METABOLIC PANEL (CC13)
ALT: 25 U/L (ref 0–55)
AST: 29 U/L (ref 5–34)
Albumin: 3.6 g/dL (ref 3.5–5.0)
Alkaline Phosphatase: 126 U/L (ref 40–150)
Anion Gap: 8 mEq/L (ref 3–11)
BUN: 19.5 mg/dL (ref 7.0–26.0)
CO2: 24 mEq/L (ref 22–29)
Calcium: 10 mg/dL (ref 8.4–10.4)
Chloride: 109 mEq/L (ref 98–109)
Creatinine: 1.5 mg/dL — ABNORMAL HIGH (ref 0.7–1.3)
EGFR: 44 mL/min/{1.73_m2} — ABNORMAL LOW (ref >90)
Glucose: 89 mg/dl (ref 70–140)
Potassium: 4.2 mEq/L (ref 3.5–5.1)
Sodium: 141 mEq/L (ref 136–145)
Total Bilirubin: 0.34 mg/dL (ref 0.20–1.20)
Total Protein: 6.6 g/dL (ref 6.4–8.3)

## 2015-04-13 NOTE — Progress Notes (Signed)
[place] Clinical Social Work  Clinical Social Work met with patient/family at [employer] appointment to offer support and assess for psychosocial needs.  Devin [name] and his family had no questions or concerns at this time.  Clinical Social Work briefly discussed Clinical Social Work role and [employer] support programs/services.  Clinical Social Work encouraged patient to call with any additional questions or concerns.   [name], MSW, LCSW, OSW-C Clinical Social Worker [facility] [phone]
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
## 2015-04-14 ENCOUNTER — Telehealth: Payer: Self-pay | Admitting: Internal Medicine

## 2015-04-14 ENCOUNTER — Encounter: Payer: Self-pay | Admitting: *Deleted

## 2015-04-14 NOTE — Telephone Encounter (Signed)
s.w. pt and advised on May appt....ok and aware
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
## 2015-04-14 NOTE — Progress Notes (Signed)
Oncology Nurse Navigator Documentation  Oncology Nurse Navigator Flowsheets 04/14/2015  Navigator Encounter Type Clinic/MDC-04/13/15  Patient Visit Type Initial  Treatment Phase Other  Barriers/Navigation Needs Education  Education Newly Diagnosed Cancer Education  Time Spent with Patient 15         Thoracic Treatment Summary Name:Devin [name] Date:04/14/2015 DOB:06/14/1940 Your Medical Team Medical Oncologist:Dr. Julien Nordmann Surgeon:Dr. 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  Pathological stage will be determined after surgery.  Staging is based on the size of the tumor, involvement of lymph nodes or not, and whether or not the cancer center has spread. Recommendations Recommendations: Observation   These recommendations are based on information available as of today's consult.  This is subject to change depending further testing or exams. Next Steps Next Step: Medical Oncology will set up follow up appointments-follow up in 6 months with CT Chest scan  Barriers to Care What do you perceive as a potential barrier that may prevent you from receiving your treatment plan? Education Information given and explained  Support information given and explained    Resources Given: [employer] booklet on lung cancer  [place] [email or web address] Ricker 4-656-812-7517 Support/Resources Services at [facility]   Questions Norton Blizzard, RN BSN Thoracic Oncology Nurse Navigator at [place] is a nurse navigator that is available to assist you through your cancer journey.  She  can answer your questions and/or provide resources regarding your treatment plan, emotional support, or financial concerns.

## 2015-04-18 ENCOUNTER — Encounter (HOSPITAL_COMMUNITY): Payer: Self-pay

## 2015-04-19 DIAGNOSIS — C61 Malignant neoplasm of prostate: Secondary | ICD-10-CM | POA: Diagnosis not present

## 2015-04-19 DIAGNOSIS — C779 Secondary and unspecified malignant neoplasm of lymph node, unspecified: Secondary | ICD-10-CM | POA: Diagnosis not present

## 2015-04-19 DIAGNOSIS — C7951 Secondary malignant neoplasm of bone: Secondary | ICD-10-CM | POA: Diagnosis not present

## 2015-04-20 ENCOUNTER — Telehealth: Payer: Self-pay | Admitting: *Deleted

## 2015-04-20 ENCOUNTER — Other Ambulatory Visit: Payer: Self-pay | Admitting: *Deleted

## 2015-04-20 DIAGNOSIS — G8918 Other acute postprocedural pain: Secondary | ICD-10-CM

## 2015-04-20 MED ORDER — OXYCODONE HCL 5 MG PO TABS: 5.0000 mg | ORAL_TABLET | Freq: Four times a day (QID) | ORAL | Status: DC | PRN

## 2015-04-20 NOTE — Telephone Encounter (Signed)
Devin [name] called requesting a refill for his Oxycodone s/p LVATS/LOBECTOMY. I informed him that a new signed script would be available tomorrow at the office and he agreed.

## 2015-04-24 ENCOUNTER — Telehealth: Payer: Self-pay | Admitting: *Deleted

## 2015-04-24 NOTE — Telephone Encounter (Signed)
Oncology Nurse Navigator Documentation  Oncology Nurse Navigator Flowsheets 04/24/2015  Navigator Encounter Type Telephone/I called to follow up from thoracic clinic on 04/13/15.  I left vm message to call if needed with my name and phone number  Patient Visit Type Follow-up  Treatment Phase Other  Time Spent with Patient 15

## 2015-05-02 ENCOUNTER — Other Ambulatory Visit: Payer: Self-pay | Admitting: Endocrinology

## 2015-05-08 ENCOUNTER — Other Ambulatory Visit: Payer: Self-pay | Admitting: Endocrinology

## 2015-05-08 ENCOUNTER — Encounter: Payer: Self-pay | Admitting: Endocrinology

## 2015-05-08 ENCOUNTER — Ambulatory Visit (INDEPENDENT_AMBULATORY_CARE_PROVIDER_SITE_OTHER): Payer: Medicare Other | Admitting: Endocrinology

## 2015-05-08 VITALS — BP 132/80 | HR 82 | Temp 98.1°F | Ht 69.0 in | Wt 222.0 lb

## 2015-05-08 DIAGNOSIS — E785 Hyperlipidemia, unspecified: Secondary | ICD-10-CM

## 2015-05-08 DIAGNOSIS — M810 Age-related osteoporosis without current pathological fracture: Secondary | ICD-10-CM

## 2015-05-08 DIAGNOSIS — E890 Postprocedural hypothyroidism: Secondary | ICD-10-CM | POA: Diagnosis not present

## 2015-05-08 DIAGNOSIS — M1A9XX0 Chronic gout, unspecified, without tophus (tophi): Secondary | ICD-10-CM

## 2015-05-08 DIAGNOSIS — E1129 Type 2 diabetes mellitus with other diabetic kidney complication: Secondary | ICD-10-CM | POA: Diagnosis not present

## 2015-05-08 DIAGNOSIS — IMO0001 Reserved for inherently not codable concepts without codable children: Secondary | ICD-10-CM

## 2015-05-08 DIAGNOSIS — Z85850 Personal history of malignant neoplasm of thyroid: Secondary | ICD-10-CM

## 2015-05-08 DIAGNOSIS — I10 Essential (primary) hypertension: Secondary | ICD-10-CM | POA: Diagnosis not present

## 2015-05-08 DIAGNOSIS — Z794 Long term (current) use of insulin: Principal | ICD-10-CM

## 2015-05-08 DIAGNOSIS — Z23 Encounter for immunization: Secondary | ICD-10-CM | POA: Diagnosis not present

## 2015-05-08 LAB — URINALYSIS, ROUTINE W REFLEX MICROSCOPIC
Bilirubin Urine: NEGATIVE
Hgb urine dipstick: NEGATIVE
Ketones, ur: NEGATIVE
Leukocytes, UA: NEGATIVE
Nitrite: NEGATIVE
RBC / HPF: NONE SEEN (ref 0–?)
Specific Gravity, Urine: 1.025 (ref 1.000–1.030)
Total Protein, Urine: NEGATIVE
Urine Glucose: NEGATIVE
Urobilinogen, UA: 0.2 (ref 0.0–1.0)
pH: 5.5 (ref 5.0–8.0)

## 2015-05-08 LAB — MICROALBUMIN / CREATININE URINE RATIO
Creatinine,U: 134.2 mg/dL
Microalb Creat Ratio: 0.5 mg/g (ref 0.0–30.0)
Microalb, Ur: 0.7 mg/dL (ref 0.0–1.9)

## 2015-05-08 LAB — LDL CHOLESTEROL, DIRECT: Direct LDL: 51 mg/dL

## 2015-05-08 LAB — LIPID PANEL
Cholesterol: 153 mg/dL (ref 0–200)
HDL: 43.1 mg/dL (ref >39.00)
NonHDL: 110.09
Total CHOL/HDL Ratio: 4
Triglycerides: 274 mg/dL — ABNORMAL HIGH (ref 0.0–149.0)
VLDL: 54.8 mg/dL — ABNORMAL HIGH (ref 0.0–40.0)

## 2015-05-08 LAB — URIC ACID: Uric Acid, Serum: 6.5 mg/dL (ref 4.0–7.8)

## 2015-05-08 LAB — TSH: TSH: 5.44 u[IU]/mL — ABNORMAL HIGH (ref 0.35–4.50)

## 2015-05-08 MED ORDER — INSULIN LISPRO 100 UNIT/ML (KWIKPEN): PEN_INJECTOR | SUBCUTANEOUS | Status: DC

## 2015-05-08 MED ORDER — INSULIN LISPRO 100 UNIT/ML ~~LOC~~ SOLN: SUBCUTANEOUS | Status: DC

## 2015-05-08 NOTE — Progress Notes (Signed)
we discussed code status.  pt requests full code, but would not want to be started or maintained on artificial life-support measures if there was not a reasonable chance of recovery 

## 2015-05-08 NOTE — Progress Notes (Signed)
Subjective:    Patient ID: Devin [name], male    DOB: 1941-03-24, 74 y.o.   MRN: [id number]  HPI The state of at least three ongoing medical problems is addressed today, with interval history of each noted here: Pt returns for f/u of diabetes mellitus: DM type: Insulin-requiring type 2.  Dx'ed: 1997.  Complications: none Therapy: insulin since 2002 DKA: never. Severe hypoglycemia: never. Pancreatitis: never Other: he takes multiple daily injections Interval history: Despite not taking the NPH at bedtime, cbg's are well-controlled.  He has mild hypoglycemia at hs Hypothyroidism: he has lost a few more lbs. HTN: sob is much less now.  Past Medical History  Diagnosis Date  . VENTRAL HERNIA 04/14/2008  . Hypocalcemia 04/13/2007  . Impotence   . Psoriasis   . Crohn's disease ([place])   . Obesity (BMI 30-39.9)   . Ankylosing spondylitis ([place])     Diagnosed during lumbar fracture summer of 2016    . Diabetic neuropathy ([place])   . Osteoporosis     Pt completed 5 years of fosamax in 2013     . Gout   . 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 History of thyroid cancer     Treated with RAI and total thyroidectomy 1999    . Prostate cancer with recurrence     Treated with prostatectomy with recurrence 2012 with Lupron treatment now him   . HIstory of basal cell cancer of face     THYROID CA HX    Past Surgical History  Procedure Laterality Date  . Total thyroidectomy  1997  . Tonsillectomy    . Cardiac catheterization  03/17/2003  . Stress cardiolite  02/17/2003  . Venous doppler  05/30/2004  . Hernia repair  2009    ventral hernia  . Prostatectomy    . Laparotomy      for small bowel obstruction  . Video assisted thoracoscopy Left 03/16/2015    Procedure: VIDEO ASSISTED THORACOSCOPY;  Surgeon: Ivin Poot, MD;  Location: [place];  Service: Thoracic;  Laterality: Left;  . Thorocotomy with lobectomy  03/16/2015    Procedure: THOROCOTOMY WITH  LOBECTOMY;  Surgeon: Ivin Poot, MD;  Location: [place];  Service: Thoracic;;    Social History   Social History  . Marital Status: Married    Spouse Name: N/A  . Number of Children: N/A  . Years of Education: N/A   Occupational History  . Retired    Social History Main Topics  . Smoking status: Former Research scientist (life sciences)  . Smokeless tobacco: Never Used  . Alcohol Use: No  . Drug Use: No  . Sexual Activity: Not on file   Other Topics Concern  . Not on file   Social History Narrative   Retired Development worker, international aid    Current Outpatient Prescriptions on File Prior to Visit  Medication Sig Dispense Refill  . ASACOL HD 800 MG TBEC Take 1,600 mg by mouth 2 (two) times daily.  3  . atenolol (TENORMIN) 25 MG tablet TAKE 1/2 TABLET BY MOUTH DAILY 45 tablet 0  . B-D ULTRAFINE III SHORT PEN 31G X 8 MM MISC USE FOUR TIMES DAILY AS DIRECTED 400 each 1  . busPIRone (BUSPAR) 15 MG tablet TAKE 1 TABLET BY MOUTH TWICE DAILY 180 tablet 0  . calcium carbonate (OS-CAL) 1250 (500 CA) MG chewable tablet Chew 1 tablet by mouth 2 (two) times daily.    . cholecalciferol (VITAMIN D) 1000 UNITS tablet Take 1,000 Units  by mouth 2 (two) times daily.     . Glucose Blood (ASCENSIA CONTOUR TEST VI) Use as directed to test two times a day     . Insulin Pen Needle (B-D ULTRAFINE III SHORT PEN) 31G X 8 MM MISC 1 each by Other route 4 (four) times daily. 400 each 1  . leuprolide (LUPRON) 11.25 MG injection Inject 11.25 mg into the muscle every 6 (six) months.     . levothyroxine (SYNTHROID, LEVOTHROID) 175 MCG tablet TAKE 1 TABLET BY MOUTH EVERY DAY BEFORE BREAKFAST. 90 tablet 0  . lisinopril-hydrochlorothiazide (PRINZIDE,ZESTORETIC) 20-12.5 MG tablet Take 1 tablet by mouth daily.    Marland Kitchen lisinopril-hydrochlorothiazide (PRINZIDE,ZESTORETIC) 20-12.5 MG tablet TAKE 1 TABLET BY MOUTH EVERY DAY 90 tablet 0  . Multiple Vitamins-Minerals (CENTRUM SILVER) tablet Take 1 tablet by mouth daily.    Marland Kitchen oxyCODONE (OXY IR/ROXICODONE) 5 MG  immediate release tablet Take 1-2 tablets (5-10 mg total) by mouth every 6 (six) hours as needed for severe pain. (Patient not taking: Reported on 05/08/2015) 50 tablet 0   No current facility-administered medications on file prior to visit.    No Known Allergies  Family History  Problem Relation Age of Onset  . Cancer Neg Hx     No cancer in the patient's immediate family, except of course for the patient himself, as noted    BP 132/80 mmHg  Pulse 82  Temp(Src) 98.1 F (36.7 C) (Oral)  Ht '5\' 9"'$  (1.753 m)  Wt 222 lb (100.699 kg)  BMI 32.77 kg/m2  SpO2 97%    Review of Systems No recent gout sxs  Denies LOC    Objective:   Physical Exam VITAL SIGNS:  See vs page GENERAL: no distress Neck: a healed scar is present.  i do not appreciate a nodule in the thyroid or elsewhere in the neck LUNGS:  Clear to auscultation HEART:  Regular rate and rhythm without murmurs noted. Normal S1,S2.   Pulses: dorsalis pedis intact bilat.   MSK: no deformity of the feet CV: 1+ bilat leg edema Skin:  no ulcer on the feet.  normal color and temp on the feet. Neuro: sensation is intact to touch on the feet   Lab Results  Component Value Date   HGBA1C 6.0* 03/14/2015   Lab Results  Component Value Date   TSH 5.44* 05/08/2015      Assessment & Plan:  DM: overcontrolled. Postsurgical hypothyroidism: TSH is slightly off. HTN: well-controlled.    Please stay off the bedtime NPH. Please reduce the supper humalog to 20 units. Please continue the same synthroid.  We'll recheck this next time Please continue the same zestoretic  Subjective:   Patient here for Medicare annual wellness visit and management of other chronic and acute problems.     Risk factors: advanced age    69 of Physicians Providing Medical Care to Patient:  See "snapshot"   Activities of Daily Living: In your present state of health, do you have any difficulty performing the following activities?:    Preparing food and eating?: No  Bathing yourself: No  Getting dressed: No  Using the toilet:No  Moving around from place to place: No  In the past year have you fallen or had a near fall?: No    Home Safety: Has smoke detector and wears seat belts. No firearms. No excess sun exposure.  Diet and Exercise  Current exercise habits: pt says improved since surgery Dietary issues discussed: pt reports a healthy diet  Depression Screen  Q1: Over the past two weeks, have you felt down, depressed or hopeless? no  Q2: Over the past two weeks, have you felt little interest or pleasure in doing things? no   The following portions of the patient's history were reviewed and updated as appropriate: allergies, current medications, past family history, past medical history, past social history, past surgical history and problem list.   Review of Systems  Denies hearing loss, and visual loss Objective:   Vision:  Sees opthalmologist Hearing: grossly normal Body mass index:  See vs page Msk: pt easily and quickly performs "get-up-and-go" from a sitting position Cognitive Impairment Assessment: cognition, memory and judgment appear normal.  remembers 3/3 at 5 minutes.  excellent recall.  can easily read and write a sentence.  alert and oriented x 3   Assessment:   Medicare wellness utd on preventive parameters    Plan:   During the course of the visit the patient was educated and counseled about appropriate screening and preventive services including:        Fall prevention   Bone densitometry screening  Diabetes screening  Nutrition counseling   Vaccines / LABS Zostavax / Pneumococcal Vaccine  today  PSA  Patient Instructions (the written plan) was given to the patient.

## 2015-05-08 NOTE — Patient Instructions (Addendum)
Please stay off the bedtime NPH. Please reduce the supper humalog to 20 units. please consider these measures for your health:  minimize alcohol.  do not use tobacco products.  have a colonoscopy at least every 10 years from age 72.  keep firearms safely stored.  always use seat belts.  have working smoke alarms in your home.  see an eye doctor and dentist regularly.  never drive under the influence of alcohol or drugs (including prescription drugs).  those with fair skin should take precautions against the sun. it is critically important to prevent falling down (keep floor areas well-lit, dry, and free of loose objects.  If you have a cane, walker, or wheelchair, you should use it, even for short trips around the house.  Also, try not to rush).  blood tests are requested for you today.  We'll let you know about the results.   Please come back for a follow-up appointment in 3 months.

## 2015-05-09 LAB — THYROGLOBULIN LEVEL: Thyroglobulin: 0.1 ng/mL — ABNORMAL LOW (ref 2.8–40.9)

## 2015-05-09 LAB — THYROGLOBULIN ANTIBODY: Thyroglobulin Ab: <1 IU/mL (ref <2)

## 2015-06-06 ENCOUNTER — Other Ambulatory Visit: Payer: Self-pay | Admitting: Endocrinology

## 2015-06-07 ENCOUNTER — Encounter: Payer: Self-pay | Admitting: *Deleted

## 2015-06-07 ENCOUNTER — Encounter: Payer: Self-pay | Admitting: Cardiothoracic Surgery

## 2015-06-13 ENCOUNTER — Other Ambulatory Visit: Payer: Self-pay | Admitting: Cardiothoracic Surgery

## 2015-06-13 ENCOUNTER — Other Ambulatory Visit: Payer: Self-pay | Admitting: Endocrinology

## 2015-06-13 DIAGNOSIS — C3490 Malignant neoplasm of unspecified part of unspecified bronchus or lung: Secondary | ICD-10-CM

## 2015-06-14 ENCOUNTER — Ambulatory Visit
Admission: RE | Admit: 2015-06-14 | Discharge: 2015-06-14 | Disposition: A | Discharge status: Home / Self Care | Payer: Medicare Other | Source: Ambulatory Visit | Attending: Cardiothoracic Surgery | Admitting: Cardiothoracic Surgery

## 2015-06-14 ENCOUNTER — Ambulatory Visit (INDEPENDENT_AMBULATORY_CARE_PROVIDER_SITE_OTHER): Payer: Self-pay | Admitting: Cardiothoracic Surgery

## 2015-06-14 ENCOUNTER — Encounter: Payer: Self-pay | Admitting: Cardiothoracic Surgery

## 2015-06-14 VITALS — BP 123/66 | HR 86 | Resp 20 | Ht 69.0 in | Wt 218.0 lb

## 2015-06-14 DIAGNOSIS — C3490 Malignant neoplasm of unspecified part of unspecified bronchus or lung: Secondary | ICD-10-CM | POA: Diagnosis not present

## 2015-06-14 DIAGNOSIS — Z902 Acquired absence of lung [part of]: Secondary | ICD-10-CM

## 2015-06-14 DIAGNOSIS — C3412 Malignant neoplasm of upper lobe, left bronchus or lung: Secondary | ICD-10-CM

## 2015-06-14 NOTE — Progress Notes (Signed)
PCP is Renato Shin, MD Referring Provider is Renato Shin, MD  Chief Complaint  Patient presents with  . Routine Post Op    2 month f/u with CXR    HPI: Patient returns for 3 month followup after left VATS, left upper lobectomy and lymph node dissection for stage I a. Adenocarcinoma. Patient is doing well clinically with minimal shortness of breath and no postthoracotomy pain. Chest x-ray taken today is clear Patient was evaluated by Dr. Julien Nordmann who has recommended serial surveillance CT scans but no adjunctive   chemotherapy for his limited stage lung cancer.  Past Medical History  Diagnosis Date  . VENTRAL HERNIA 04/14/2008  . Hypocalcemia 04/13/2007  . Impotence   . Psoriasis   . Crohn's disease ([place])   . Obesity (BMI 30-39.9)   . Ankylosing spondylitis ([place])     Diagnosed during lumbar fracture summer of 2016    . Diabetic neuropathy ([place])   . Osteoporosis     Pt completed 5 years of fosamax in 2013     . Gout   . 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 History of thyroid cancer     Treated with RAI and total thyroidectomy 1999    . Prostate cancer with recurrence     Treated with prostatectomy with recurrence 2012 with Lupron treatment now him   . HIstory of basal cell cancer of face     THYROID CA HX    Past Surgical History  Procedure Laterality Date  . Total thyroidectomy  1997  . Tonsillectomy    . Cardiac catheterization  03/17/2003  . Stress cardiolite  02/17/2003  . Venous doppler  05/30/2004  . Hernia repair  2009    ventral hernia  . Prostatectomy    . Laparotomy      for small bowel obstruction  . Video assisted thoracoscopy Left 03/16/2015    Procedure: VIDEO ASSISTED THORACOSCOPY;  Surgeon: Ivin Poot, MD;  Location: [place];  Service: Thoracic;  Laterality: Left;  . Thorocotomy with lobectomy  03/16/2015    Procedure: THOROCOTOMY WITH LOBECTOMY;  Surgeon: Ivin Poot, MD;  Location: [facility] OR;  Service: Thoracic;;     Family History  Problem Relation Age of Onset  . Cancer Neg Hx     No cancer in the patient's immediate family, except of course for the patient himself, as noted    Social History Social History  Substance Use Topics  . Smoking status: Former Research scientist (life sciences)  . Smokeless tobacco: Never Used  . Alcohol Use: No    Current Outpatient Prescriptions  Medication Sig Dispense Refill  . ASACOL HD 800 MG TBEC Take 1,600 mg by mouth 2 (two) times daily.  3  . atenolol (TENORMIN) 25 MG tablet TAKE 1/2 TABLET BY MOUTH DAILY 45 tablet 0  . B-D ULTRAFINE III SHORT PEN 31G X 8 MM MISC USE FOUR TIMES DAILY AS DIRECTED 400 each 0  . BAYER CONTOUR TEST test strip TEST BLOOD SUGAR TWICE DAILY AS DIRECTED. 200 each 2  . busPIRone (BUSPAR) 15 MG tablet TAKE 1 TABLET BY MOUTH TWICE DAILY 180 tablet 0  . calcium carbonate (OS-CAL) 1250 (500 CA) MG chewable tablet Chew 1 tablet by mouth 2 (two) times daily.    . cholecalciferol (VITAMIN D) 1000 UNITS tablet Take 1,000 Units by mouth 2 (two) times daily.     . Glucose Blood (ASCENSIA CONTOUR TEST VI) Use as directed to test two times a day     .  HUMALOG KWIKPEN 100 UNIT/ML KiwkPen INJECT UNDER THE SKIN THREE TIMES DAILY. 25 UNITS, 25 UNITS, AND 30 UNITS RESPECTIVELY BEFORE MEALS. 30 mL 1  . insulin lispro (HUMALOG) 100 UNIT/ML injection before meals 3 times a day 25-25-20 units, and pen needles 4/day 30 mL 12  . Insulin Pen Needle (B-D ULTRAFINE III SHORT PEN) 31G X 8 MM MISC 1 each by Other route 4 (four) times daily. 400 each 1  . leuprolide (LUPRON) 11.25 MG injection Inject 11.25 mg into the muscle every 6 (six) months.     . levothyroxine (SYNTHROID, LEVOTHROID) 175 MCG tablet TAKE 1 TABLET BY MOUTH EVERY DAY BEFORE BREAKFAST. 90 tablet 0  . lisinopril-hydrochlorothiazide (PRINZIDE,ZESTORETIC) 20-12.5 MG tablet Take 1 tablet by mouth daily.    Marland Kitchen lisinopril-hydrochlorothiazide (PRINZIDE,ZESTORETIC) 20-12.5 MG tablet TAKE 1 TABLET BY MOUTH EVERY DAY 90 tablet  0  . Multiple Vitamins-Minerals (CENTRUM SILVER) tablet Take 1 tablet by mouth daily.     No current facility-administered medications for this visit.    No Known Allergies  Review of Systems  Improved exercise tolerance Just recovered from a head cold No postthoracotomy pain Incision healed well  BP 123/66 mmHg  Pulse 86  Resp 20  Ht 5' 9"  (1.753 m)  Wt 218 lb (98.884 kg)  BMI 32.18 kg/m2  SpO2 97% Physical Exam Alert and oriented, comfortable Breath sounds clear Left VATS incision well-healed Heart rhythm regular Neuro intact  Diagnostic Tests:  chest x-ray clear  Impression: Doing well 3 months postop left upper lobectomy for stage I A. Adenocarcinoma lung.  Plan:serial CT surveillance scans will be performed by Dr. Brunilda Payor states he has a appointment for CT scan of chest in May at 6 months postop. Patient return here for any future thoracic surgical issues.  Len Childs, MD [facility] [phone]

## 2015-06-30 NOTE — Patient Outreach (Signed)
[facility]) Care Management  06/30/2015  Devin [name] [birth year] [id number]   Telephone Screen  Referral Date: 06/28/15 Referral Source: NextGen Tier 4 List Referral Reason: 2 hospital admits, 1 ED visit   Outreach attempt # 1 to patient. Male answered and reported that patient was not at home at present.    Plan: RN CM will make outreach attempt to patient within a week.  Enzo Montgomery, RN,BSN,CCM [place] Management Telephonic Care Management Coordinator Direct Phone: [phone] Toll Free: [phone] Fax: [phone]

## 2015-07-01 ENCOUNTER — Other Ambulatory Visit: Payer: Self-pay | Admitting: Endocrinology

## 2015-07-02 ENCOUNTER — Other Ambulatory Visit: Payer: Self-pay | Admitting: Endocrinology

## 2015-07-03 NOTE — Telephone Encounter (Signed)
Please advise if ok to refill. 

## 2015-07-04 ENCOUNTER — Ambulatory Visit: Payer: Self-pay

## 2015-07-04 ENCOUNTER — Other Ambulatory Visit: Payer: Self-pay

## 2015-07-04 NOTE — Patient Outreach (Signed)
[facility]) Care Management  07/04/2015  JOSYAH ACHOR 07-Mar-1941 [id number]   Telephone Screen  Referral Date:06/28/15 Referral Source: NextGen Tier 4 List Referral Reason: 2 -admissions, 1-ambulance encounter, 1-ER visit, [facility]   Outreach attempt to patient. No answer at present-unable to leave message.   Plan: RN CM will attempt outreach call to patient within a week.  Enzo Montgomery, RN,BSN,CCM [place] Management Telephonic Care Management Coordinator Direct Phone: [phone] Toll Free: [phone] Fax: [phone]

## 2015-07-04 NOTE — Patient Outreach (Signed)
[facility]) Care Management  07/04/2015  Devin [name] 03-05-1941 [id number]  Telephone Screen  Referral Date:06/28/15 Referral Source: NextGen Tier 4 List Referral Reason: 2 -admissions, 1-ambulance encounter, 1-ER visit, [facility]   Outreach attempt # 1 to patient. Male answered and reported patient was not at home.   Plan: RN CM will attempt outreach call to patient within a week.  Enzo Montgomery, RN,BSN,CCM [place] Management Telephonic Care Management Coordinator Direct Phone: [phone] Toll Free: [phone] Fax: [phone]

## 2015-07-06 ENCOUNTER — Ambulatory Visit: Payer: Self-pay

## 2015-07-06 ENCOUNTER — Other Ambulatory Visit: Payer: Self-pay

## 2015-07-06 NOTE — Patient Outreach (Signed)
[facility]) Care Management  07/06/2015  Devin [name] January 25, 1941 [id number]   Telephone Screen  Referral Date:06/28/15 Referral Source: NextGen Tier 4 List Referral Reason: 2 -admissions, 1-ambulance encounter, 1-ER visit, [facility]   Outreach attempt # 3 to patient. No answer and unable to leave voicemail message.    Plan: RN CM will send unsuccessful outreach letter to patient. RN CM will close case if no response from patient within 10 business days.  Enzo Montgomery, RN,BSN,CCM [place] Management Telephonic Care Management Coordinator Direct Phone: [phone] Toll Free: [phone] Fax: [phone]

## 2015-07-20 ENCOUNTER — Encounter: Payer: Self-pay | Admitting: *Deleted

## 2015-07-20 ENCOUNTER — Other Ambulatory Visit: Payer: Self-pay | Admitting: *Deleted

## 2015-07-20 NOTE — Patient Outreach (Signed)
[facility] [facility]) Care Management  07/20/2015  SAVVAS ROPER 03-May-1941 [id number]   Subjective: Telephone call to patient's primary care MD Dr. Loanne Drilling, spoke with [facility], left HIPAA compliant message for Megan (Dr. Cordelia Pen) assistant, requesting call back.  Objective: Referral Date:06/28/15.  Referral Source: NextGen Tier 4 List.   Referral Reason: 2 -admissions, 1-ambulance encounter, 1-ED visit, 7-Home Health visits.   Assessment:  Patient has not responded to outreach attempts, will proceed with case closure.   Plan:  RNCM will send case closure due to being unable to reach, to patient's primary MD. RNCM will notify Damita Rhodie to case close due unable to reach.  Weda Baumgarner H. Annia Friendly, BSN, [place] Management [facility] Telephonic CM Phone: [phone] Fax: [phone]

## 2015-08-06 ENCOUNTER — Other Ambulatory Visit: Payer: Self-pay | Admitting: Endocrinology

## 2015-09-05 ENCOUNTER — Ambulatory Visit (INDEPENDENT_AMBULATORY_CARE_PROVIDER_SITE_OTHER): Payer: Medicare Other | Admitting: Endocrinology

## 2015-09-05 VITALS — BP 122/70 | HR 81 | Temp 98.5°F | Ht 69.0 in | Wt 224.0 lb

## 2015-09-05 DIAGNOSIS — E890 Postprocedural hypothyroidism: Secondary | ICD-10-CM

## 2015-09-05 DIAGNOSIS — E1129 Type 2 diabetes mellitus with other diabetic kidney complication: Secondary | ICD-10-CM

## 2015-09-05 DIAGNOSIS — Z794 Long term (current) use of insulin: Secondary | ICD-10-CM

## 2015-09-05 DIAGNOSIS — IMO0001 Reserved for inherently not codable concepts without codable children: Secondary | ICD-10-CM

## 2015-09-05 LAB — POCT GLYCOSYLATED HEMOGLOBIN (HGB A1C): Hemoglobin A1C: 6.2

## 2015-09-05 LAB — TSH: TSH: 1.46 u[IU]/mL (ref 0.35–4.50)

## 2015-09-05 NOTE — Progress Notes (Signed)
Subjective:    Patient ID: Devin [name], male    DOB: [birth year], 75 y.o.   MRN: [id number]  HPI Pt returns for f/u of diabetes mellitus: DM type: Insulin-requiring type 2.  Dx'ed: 1997.  Complications: polyneuropathy Therapy: insulin since 2002 DKA: never. Severe hypoglycemia: never. Pancreatitis: never Other: he takes multiple daily injections Interval history: no cbg record, but states cbg's are well-controlled.  There is no trend throughout the day.   Hypothyroidism: he has lost a few more lbs.   Past Medical History  Diagnosis Date  . VENTRAL HERNIA 04/14/2008  . Hypocalcemia 04/13/2007  . Impotence   . Psoriasis   . Crohn's disease ([place])   . Obesity (BMI 30-39.9)   . Ankylosing spondylitis ([place])     Diagnosed during lumbar fracture summer of 2016    . Diabetic neuropathy ([place])   . Osteoporosis     Pt completed 5 years of fosamax in 2013     . Gout   . 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 History of thyroid cancer     Treated with RAI and total thyroidectomy 1999    . Prostate cancer with recurrence     Treated with prostatectomy with recurrence 2012 with Lupron treatment now him   . HIstory of basal cell cancer of face     THYROID CA HX    Past Surgical History  Procedure Laterality Date  . Total thyroidectomy  1997  . Tonsillectomy    . Cardiac catheterization  03/17/2003  . Stress cardiolite  02/17/2003  . Venous doppler  05/30/2004  . Hernia repair  2009    ventral hernia  . Prostatectomy    . Laparotomy      for small bowel obstruction  . Video assisted thoracoscopy Left 03/16/2015    Procedure: VIDEO ASSISTED THORACOSCOPY;  Surgeon: Ivin Poot, MD;  Location: [facility];  Service: Thoracic;  Laterality: Left;  . Thorocotomy with lobectomy  03/16/2015    Procedure: THOROCOTOMY WITH LOBECTOMY;  Surgeon: Ivin Poot, MD;  Location: [facility];  Service: Thoracic;;    Social History   Social History  . Marital Status:  Married    Spouse Name: N/A  . Number of Children: N/A  . Years of Education: N/A   Occupational History  . Retired    Social History Main Topics  . Smoking status: Former Research scientist (life sciences)  . Smokeless tobacco: Never Used  . Alcohol Use: No  . Drug Use: No  . Sexual Activity: Not on file   Other Topics Concern  . Not on file   Social History Narrative   Retired Development worker, international aid    Current Outpatient Prescriptions on File Prior to Visit  Medication Sig Dispense Refill  . ASACOL HD 800 MG TBEC Take 1,600 mg by mouth 2 (two) times daily.  3  . atenolol (TENORMIN) 25 MG tablet TAKE 1/2 TABLET BY MOUTH DAILY 45 tablet 0  . B-D ULTRAFINE III SHORT PEN 31G X 8 MM MISC USE FOUR TIMES DAILY AS DIRECTED 400 each 0  . BAYER CONTOUR TEST test strip TEST BLOOD SUGAR TWICE DAILY AS DIRECTED. 200 each 2  . busPIRone (BUSPAR) 15 MG tablet TAKE 1 TABLET BY MOUTH TWICE DAILY 180 tablet 0  . calcium carbonate (OS-CAL) 1250 (500 CA) MG chewable tablet Chew 1 tablet by mouth 2 (two) times daily.    . cholecalciferol (VITAMIN D) 1000 UNITS tablet Take 1,000 Units by mouth 2 (two) times daily.     Marland Kitchen  Glucose Blood (ASCENSIA CONTOUR TEST VI) Use as directed to test two times a day     . Insulin Pen Needle (B-D ULTRAFINE III SHORT PEN) 31G X 8 MM MISC 1 each by Other route 4 (four) times daily. 400 each 1  . leuprolide (LUPRON) 11.25 MG injection Inject 11.25 mg into the muscle every 6 (six) months.     . levothyroxine (SYNTHROID, LEVOTHROID) 175 MCG tablet TAKE 1 TABLET BY MOUTH EVERY DAY BEFORE BREAKFAST. 90 tablet 0  . lisinopril-hydrochlorothiazide (PRINZIDE,ZESTORETIC) 20-12.5 MG tablet Take 1 tablet by mouth daily.    Marland Kitchen lisinopril-hydrochlorothiazide (PRINZIDE,ZESTORETIC) 20-12.5 MG tablet TAKE 1 TABLET BY MOUTH EVERY DAY 90 tablet 0  . Multiple Vitamins-Minerals (CENTRUM SILVER) tablet Take 1 tablet by mouth daily.     No current facility-administered medications on file prior to visit.    No Known  Allergies  Family History  Problem Relation Age of Onset  . Cancer Neg Hx     No cancer in the patient's immediate family, except of course for the patient himself, as noted    BP 122/70 mmHg  Pulse 81  Temp(Src) 98.5 F (36.9 C) (Oral)  Ht '5\' 9"'$  (1.753 m)  Wt 224 lb (101.606 kg)  BMI 33.06 kg/m2  SpO2 97%  Review of Systems Chronic numbness of the feet is unchanged.  He denies hypoglycemia.     Objective:   Physical Exam VITAL SIGNS:  See vs page.  GENERAL: no distress.  Pulses: dorsalis pedis intact bilat.   MSK: no deformity of the feet.  CV: trace bilat leg edema.  Skin:  no ulcer on the feet.  normal color and temp on the feet. Neuro: sensation is intact to touch on the feet, but decreased from normal.     A1c=6.2% Lab Results  Component Value Date   TSH 1.46 09/05/2015      Assessment & Plan:  DM: slightly overcontrolled Hypothyroidism, well-replaced. Thyroid cancer: given long disease-free interval, a normal TSH is the goal.   Polyneuropathy: stable  Patient is advised the following: Patient Instructions  check your blood sugar twice a day.  vary the time of day when you check, between before the 3 meals, and at bedtime.  also check if you have symptoms of your blood sugar being too high or too low.  please keep a record of the readings and bring it to your next appointment here (or you can bring the meter itself).  You can write it on any piece of paper.  please call us sooner if your blood sugar goes below 70, or if you have a lot of readings over 200. blood tests are requested for you today.  We'll let you know about the results.   Please reduce the huamlog to 3 times a day (just before each meal) 25-20-30 units Please come back for a follow-up appointment in 4 months.

## 2015-09-05 NOTE — Patient Instructions (Addendum)
check your blood sugar twice a day.  vary the time of day when you check, between before the 3 meals, and at bedtime.  also check if you have symptoms of your blood sugar being too high or too low.  please keep a record of the readings and bring it to your next appointment here (or you can bring the meter itself).  You can write it on any piece of paper.  please call us sooner if your blood sugar goes below 70, or if you have a lot of readings over 200. blood tests are requested for you today.  We'll let you know about the results.   Please reduce the huamlog to 3 times a day (just before each meal) 25-20-30 units Please come back for a follow-up appointment in 4 months.

## 2015-09-12 ENCOUNTER — Other Ambulatory Visit: Payer: Self-pay | Admitting: Endocrinology

## 2015-09-27 ENCOUNTER — Telehealth: Payer: Self-pay | Admitting: Endocrinology

## 2015-09-27 ENCOUNTER — Other Ambulatory Visit: Payer: Self-pay | Admitting: Endocrinology

## 2015-09-27 MED ORDER — BUSPIRONE HCL 15 MG PO TABS: 15.0000 mg | ORAL_TABLET | Freq: Two times a day (BID) | ORAL | Status: DC

## 2015-09-27 NOTE — Telephone Encounter (Signed)
Rx submitted per pt's request.  

## 2015-09-27 NOTE — Telephone Encounter (Signed)
Patient need a Refill of busPIRone (BUSPAR) 15 MG tablet, [id number] - [place], [address] [place] AT [address] [phone] (Phone) [phone] (Fax)

## 2015-09-28 ENCOUNTER — Telehealth: Payer: Self-pay | Admitting: Endocrinology

## 2015-09-28 MED ORDER — BUSPIRONE HCL 15 MG PO TABS: 15.0000 mg | ORAL_TABLET | Freq: Two times a day (BID) | ORAL | Status: DC

## 2015-09-28 NOTE — Telephone Encounter (Signed)
I contacted the pt. He stated the pharmacy did not received the Buspar rx we had sent electronically on 09/27/2015. Pt advised we have resubmitted the rx and to call if he had any issus picking the medication up.

## 2015-09-28 NOTE — Telephone Encounter (Signed)
Pt would like a call back from you regarding one of his medications

## 2015-10-03 DIAGNOSIS — K5010 Crohn's disease of large intestine without complications: Secondary | ICD-10-CM | POA: Diagnosis not present

## 2015-10-09 DIAGNOSIS — Z09 Encounter for follow-up examination after completed treatment for conditions other than malignant neoplasm: Secondary | ICD-10-CM | POA: Diagnosis not present

## 2015-10-09 DIAGNOSIS — Z980 Intestinal bypass and anastomosis status: Secondary | ICD-10-CM | POA: Diagnosis not present

## 2015-10-09 DIAGNOSIS — K529 Noninfective gastroenteritis and colitis, unspecified: Secondary | ICD-10-CM | POA: Diagnosis not present

## 2015-10-09 DIAGNOSIS — K5080 Crohn's disease of both small and large intestine without complications: Secondary | ICD-10-CM | POA: Diagnosis not present

## 2015-10-09 DIAGNOSIS — K633 Ulcer of intestine: Secondary | ICD-10-CM | POA: Diagnosis not present

## 2015-10-09 DIAGNOSIS — Z86010 Personal history of colonic polyps: Secondary | ICD-10-CM | POA: Diagnosis not present

## 2015-10-10 ENCOUNTER — Other Ambulatory Visit (HOSPITAL_BASED_OUTPATIENT_CLINIC_OR_DEPARTMENT_OTHER): Payer: Medicare Other

## 2015-10-10 ENCOUNTER — Other Ambulatory Visit: Payer: Self-pay | Admitting: *Deleted

## 2015-10-10 ENCOUNTER — Encounter (HOSPITAL_COMMUNITY): Payer: Self-pay

## 2015-10-10 ENCOUNTER — Ambulatory Visit (HOSPITAL_COMMUNITY)
Admission: RE | Admit: 2015-10-10 | Discharge: 2015-10-10 | Disposition: A | Discharge status: Home / Self Care | Payer: Medicare Other | Source: Ambulatory Visit | Attending: Internal Medicine | Admitting: Internal Medicine

## 2015-10-10 DIAGNOSIS — R911 Solitary pulmonary nodule: Secondary | ICD-10-CM | POA: Diagnosis not present

## 2015-10-10 DIAGNOSIS — C3490 Malignant neoplasm of unspecified part of unspecified bronchus or lung: Secondary | ICD-10-CM

## 2015-10-10 DIAGNOSIS — I2510 Atherosclerotic heart disease of native coronary artery without angina pectoris: Secondary | ICD-10-CM | POA: Insufficient documentation

## 2015-10-10 DIAGNOSIS — C3492 Malignant neoplasm of unspecified part of left bronchus or lung: Secondary | ICD-10-CM | POA: Insufficient documentation

## 2015-10-10 DIAGNOSIS — Z902 Acquired absence of lung [part of]: Secondary | ICD-10-CM | POA: Insufficient documentation

## 2015-10-10 DIAGNOSIS — R918 Other nonspecific abnormal finding of lung field: Secondary | ICD-10-CM | POA: Diagnosis not present

## 2015-10-10 DIAGNOSIS — K8020 Calculus of gallbladder without cholecystitis without obstruction: Secondary | ICD-10-CM | POA: Diagnosis not present

## 2015-10-10 LAB — COMPREHENSIVE METABOLIC PANEL
ALT: 30 U/L (ref 0–55)
AST: 27 U/L (ref 5–34)
Albumin: 3.5 g/dL (ref 3.5–5.0)
Alkaline Phosphatase: 89 U/L (ref 40–150)
Anion Gap: 9 mEq/L (ref 3–11)
BUN: 25.9 mg/dL (ref 7.0–26.0)
CO2: 22 mEq/L (ref 22–29)
Calcium: 9.6 mg/dL (ref 8.4–10.4)
Chloride: 112 mEq/L — ABNORMAL HIGH (ref 98–109)
Creatinine: 1.8 mg/dL — ABNORMAL HIGH (ref 0.7–1.3)
EGFR: 36 mL/min/{1.73_m2} — ABNORMAL LOW (ref >90)
Glucose: 98 mg/dl (ref 70–140)
Potassium: 4.2 mEq/L (ref 3.5–5.1)
Sodium: 143 mEq/L (ref 136–145)
Total Bilirubin: <0.3 mg/dL (ref 0.20–1.20)
Total Protein: 6.6 g/dL (ref 6.4–8.3)

## 2015-10-10 LAB — CBC WITH DIFFERENTIAL/PLATELET
BASO%: 0.2 % (ref 0.0–2.0)
Basophils Absolute: 0 10*3/uL (ref 0.0–0.1)
EOS%: 2.1 % (ref 0.0–7.0)
Eosinophils Absolute: 0.2 10*3/uL (ref 0.0–0.5)
HCT: 38.3 % — ABNORMAL LOW (ref 38.4–49.9)
HGB: 13 g/dL (ref 13.0–17.1)
LYMPH%: 19.1 % (ref 14.0–49.0)
MCH: 31 pg (ref 27.2–33.4)
MCHC: 33.9 g/dL (ref 32.0–36.0)
MCV: 91.2 fL (ref 79.3–98.0)
MONO#: 0.8 10*3/uL (ref 0.1–0.9)
MONO%: 8.9 % (ref 0.0–14.0)
NEUT#: 6 10*3/uL (ref 1.5–6.5)
NEUT%: 69.7 % (ref 39.0–75.0)
Platelets: 221 10*3/uL (ref 140–400)
RBC: 4.2 10*6/uL (ref 4.20–5.82)
RDW: 13.5 % (ref 11.0–14.6)
WBC: 8.5 10*3/uL (ref 4.0–10.3)
lymph#: 1.6 10*3/uL (ref 0.9–3.3)

## 2015-10-10 MED ORDER — IOPAMIDOL (ISOVUE-300) INJECTION 61%
75.0000 mL | Freq: Once | INTRAVENOUS | Status: AC | PRN
  Administered 2015-10-10: 60 mL via INTRAVENOUS

## 2015-10-17 ENCOUNTER — Telehealth: Payer: Self-pay | Admitting: Internal Medicine

## 2015-10-17 ENCOUNTER — Encounter: Payer: Self-pay | Admitting: Internal Medicine

## 2015-10-17 ENCOUNTER — Ambulatory Visit (HOSPITAL_BASED_OUTPATIENT_CLINIC_OR_DEPARTMENT_OTHER): Payer: Medicare Other | Admitting: Internal Medicine

## 2015-10-17 VITALS — BP 125/50 | HR 86 | Temp 98.5°F | Resp 18 | Ht 69.0 in | Wt 219.5 lb

## 2015-10-17 DIAGNOSIS — C3492 Malignant neoplasm of unspecified part of left bronchus or lung: Secondary | ICD-10-CM

## 2015-10-17 DIAGNOSIS — C61 Malignant neoplasm of prostate: Secondary | ICD-10-CM | POA: Diagnosis not present

## 2015-10-17 DIAGNOSIS — R911 Solitary pulmonary nodule: Secondary | ICD-10-CM

## 2015-10-17 DIAGNOSIS — N289 Disorder of kidney and ureter, unspecified: Secondary | ICD-10-CM

## 2015-10-17 DIAGNOSIS — C3412 Malignant neoplasm of upper lobe, left bronchus or lung: Secondary | ICD-10-CM | POA: Diagnosis not present

## 2015-10-17 NOTE — Progress Notes (Signed)
[place] [phone]   [phone]  OFFICE PROGRESS NOTE  Renato Shin, MD [address] [place] [address]  DIAGNOSIS: Stage IA (T1b, N0, M0) non-small cell lung cancer, adenocarcinoma presenting with left upper lobe pulmonary nodule diagnosed in September 2016.  PRIOR THERAPY: Status post left VATS with left upper lobectomy and mediastinal lymph node dissection under the care of Dr. Prescott Gum on 03/16/2015.  CURRENT THERAPY: Observation.  INTERVAL HISTORY: Devin [name] 75 y.o. male returns to the clinic today for six-month follow-up visit. The patient has been on observation for the last 6 months and feeling fine. He continues to have intermittent soreness on the left side of the chest. He denied having any significant shortness of breath, cough or hemoptysis. He denied having any significant weight loss or night sweats. He has no nausea or vomiting. No fever or chills. He had repeat CT scan of the chest performed recently and he is here for evaluation and discussion of his scan results.  MEDICAL HISTORY: Past Medical History  Diagnosis Date  . VENTRAL HERNIA 04/14/2008  . Hypocalcemia 04/13/2007  . Impotence   . Psoriasis   . Crohn's disease ([place])   . Obesity (BMI 30-39.9)   . Ankylosing spondylitis ([place])     Diagnosed during lumbar fracture summer of 2016    . Diabetic neuropathy ([place])   . Osteoporosis     Pt completed 5 years of fosamax in 2013     . Gout   . 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 History of thyroid cancer     Treated with RAI and total thyroidectomy 1999    . Prostate cancer with recurrence     Treated with prostatectomy with recurrence 2012 with Lupron treatment now him   . HIstory of basal cell cancer of face     THYROID CA HX    ALLERGIES:  has No Known Allergies.  MEDICATIONS:  Current Outpatient Prescriptions  Medication Sig Dispense Refill  . ASACOL HD  800 MG TBEC Take 1,600 mg by mouth 2 (two) times daily.  3  . atenolol (TENORMIN) 25 MG tablet TAKE 1/2 TABLET BY MOUTH DAILY 45 tablet 0  . B-D ULTRAFINE III SHORT PEN 31G X 8 MM MISC USE FOUR TIMES DAILY AS DIRECTED 400 each 0  . BAYER CONTOUR TEST test strip TEST BLOOD SUGAR TWICE DAILY AS DIRECTED. 200 each 2  . busPIRone (BUSPAR) 15 MG tablet Take 1 tablet (15 mg total) by mouth 2 (two) times daily. 180 tablet 0  . calcium carbonate (OS-CAL) 1250 (500 CA) MG chewable tablet Chew 1 tablet by mouth 2 (two) times daily.    . cholecalciferol (VITAMIN D) 1000 UNITS tablet Take 1,000 Units by mouth 2 (two) times daily.     . Glucose Blood (ASCENSIA CONTOUR TEST VI) Use as directed to test two times a day     . HUMALOG KWIKPEN 100 UNIT/ML KiwkPen INJECT UNDER THE SKIN THREE TIMES DAILY( 25 UNITS, 25 UNITS AND 20 UNITS RESPECTIVELY BEFORE MEALS) 30 mL 0  . insulin lispro (HUMALOG KWIKPEN) 100 UNIT/ML KiwkPen Inject into the skin. 3 times a day (just before each meal) 25-20-30 units    . Insulin Pen Needle (B-D ULTRAFINE III SHORT PEN) 31G X 8 MM MISC 1 each by Other route 4 (four) times daily. 400 each 1  . leuprolide (LUPRON) 11.25 MG injection Inject 11.25 mg into the  muscle every 6 (six) months.     . levothyroxine (SYNTHROID, LEVOTHROID) 175 MCG tablet TAKE 1 TABLET BY MOUTH EVERY DAY BEFORE BREAKFAST. 90 tablet 0  . lisinopril-hydrochlorothiazide (PRINZIDE,ZESTORETIC) 20-12.5 MG tablet TAKE 1 TABLET BY MOUTH EVERY DAY 90 tablet 0  . Multiple Vitamins-Minerals (CENTRUM SILVER) tablet Take 1 tablet by mouth daily.     No current facility-administered medications for this visit.    SURGICAL HISTORY:  Past Surgical History  Procedure Laterality Date  . Total thyroidectomy  1997  . Tonsillectomy    . Cardiac catheterization  03/17/2003  . Stress cardiolite  02/17/2003  . Venous doppler  05/30/2004  . Hernia repair  2009    ventral hernia  . Prostatectomy    . Laparotomy      for small bowel  obstruction  . Video assisted thoracoscopy Left 03/16/2015    Procedure: VIDEO ASSISTED THORACOSCOPY;  Surgeon: Ivin Poot, MD;  Location: [facility];  Service: Thoracic;  Laterality: Left;  . Thorocotomy with lobectomy  03/16/2015    Procedure: THOROCOTOMY WITH LOBECTOMY;  Surgeon: Ivin Poot, MD;  Location: [facility];  Service: Thoracic;;    REVIEW OF SYSTEMS:  A comprehensive review of systems was negative.   PHYSICAL EXAMINATION: General appearance: alert, cooperative and no distress Head: Normocephalic, without obvious abnormality, atraumatic Neck: no adenopathy, no JVD, supple, symmetrical, trachea midline and thyroid not enlarged, symmetric, no tenderness/mass/nodules Lymph nodes: Cervical, supraclavicular, and axillary nodes normal. Resp: clear to auscultation bilaterally Back: symmetric, no curvature. ROM normal. No CVA tenderness. Cardio: regular rate and rhythm, S1, S2 normal, no murmur, click, rub or gallop GI: soft, non-tender; bowel sounds normal; no masses,  no organomegaly Extremities: extremities normal, atraumatic, no cyanosis or edema  ECOG PERFORMANCE STATUS: 1 - Symptomatic but completely ambulatory  Blood pressure 125/50, pulse 86, temperature 98.5 F (36.9 C), temperature source Oral, resp. rate 18, height '5\' 9"'$  (1.753 m), weight 219 lb 8 oz (99.565 kg), SpO2 99 %.  LABORATORY DATA: Lab Results  Component Value Date   WBC 8.5 10/10/2015   HGB 13.0 10/10/2015   HCT 38.3* 10/10/2015   MCV 91.2 10/10/2015   PLT 221 10/10/2015      Chemistry      Component Value Date/Time   NA 143 10/10/2015 1039   NA 138 03/18/2015 0420   K 4.2 10/10/2015 1039   K 3.6 03/18/2015 0420   CL 102 03/18/2015 0420   CO2 22 10/10/2015 1039   CO2 27 03/18/2015 0420   BUN 25.9 10/10/2015 1039   BUN 12 03/18/2015 0420   CREATININE 1.8* 10/10/2015 1039   CREATININE 1.08 03/18/2015 0420   CREATININE 1.57* 04/29/2012 0840      Component Value Date/Time   CALCIUM 9.6 10/10/2015  1039   CALCIUM 9.1 03/18/2015 0420   CALCIUM 9.7 04/29/2012 0840   ALKPHOS 89 10/10/2015 1039   ALKPHOS 75 03/18/2015 0420   AST 27 10/10/2015 1039   AST 30 03/18/2015 0420   ALT 30 10/10/2015 1039   ALT 24 03/18/2015 0420   BILITOT <0.30 10/10/2015 1039   BILITOT 0.4 03/18/2015 0420       RADIOGRAPHIC STUDIES: Ct Chest W Contrast  10/10/2015  CLINICAL DATA:  Well differentiated stage I invasive lung adenocarcinoma of the left upper lobe status post left upper lobectomy 03/16/2015, presenting for restaging. EXAM: CT CHEST WITH CONTRAST TECHNIQUE: Multidetector CT imaging of the chest was performed during intravenous contrast administration. CONTRAST:  54m ISOVUE-300 IOPAMIDOL (ISOVUE-300) INJECTION  61% COMPARISON:  03/08/2015 PET-CT. FINDINGS: Mediastinum/Nodes: Normal heart size. Trace pericardial fluid/thickening in the left anterior pericardial space, new. Right coronary atherosclerosis. Great vessels are normal in course and caliber. No central pulmonary emboli. Total thyroidectomy. Normal esophagus. No pathologically enlarged axillary, mediastinal or hilar lymph nodes. Lungs/Pleura: No pneumothorax. No pleural effusion. Status post left upper lobectomy. Subpleural solid 4 mm posterior left lower lobe pulmonary nodule (series 5/ image 39), not definitely seen on prior PET-CT. Faint peribronchovascular ground-glass attenuation in the mid portion of the left lower lobe (series 5/image 50). No acute consolidative airspace disease or additional significant pulmonary nodules. Stable elevation of the right hemidiaphragm with subsegmental atelectasis at the right lung base. Upper abdomen: Nondistended gallbladder contains gallstones measuring up to the 13 mm, with no gallbladder wall thickening or pericholecystic fluid. Partially visualized hernia repair mesh in the ventral upper abdomen. Musculoskeletal: No aggressive appearing focal osseous lesions. Moderate degenerative changes in the thoracic  spine. Partially visualized bilateral posterior spinal fusion hardware extending from the T10 level inferiorly into the lumbar spine. Mild L1 compression fracture appears stable since 06/14/2015 radiographs and chronic. IMPRESSION: 1. No definite evidence of local tumor recurrence or metastatic disease status post left upper lobectomy. 2. Nonspecific faint peribronchovascular ground-glass attenuation in the left lower lobe, favor a minimal infectious or inflammatory bronchiolitis, recommend attention on follow-up chest CT in 3 months. 3. Solitary subpleural 4 mm solid left lower lobe pulmonary nodule, not definitely seen on prior PET-CT, recommend attention on follow-up chest CT in 3 months. 4. One vessel coronary atherosclerosis. 5. Cholelithiasis. Electronically Signed   By: Ilona Sorrel M.D.   On: 10/10/2015 13:38    ASSESSMENT AND PLAN: This is a very pleasant 75 years old white male with history of stage IA non-small cell lung cancer, adenocarcinoma status post left upper lobectomy under the care of Dr. Prescott Gum in November 2016. The patient is doing fine today with no specific complaints. The recent CT scan of the chest showed no evidence for disease recurrence but there was a solitary 4 mm left lower lobe pulmonary nodule that was not seen on the previous PET-CT. I discussed the scan results and showed the images to the patient today. I recommended for him to have repeat CT scan of the chest in 3 months for further evaluation of this nodule. He was advised to call immediately if he has any concerning symptoms in the interval. For the insufficiency, I recommended for the patient to have referral from his primary care physician to see a nephrologist for monitoring of his renal insufficiency. The patient was advised to call immediately if he has any concerning symptoms in the interval. The patient voices understanding of current disease status and treatment options and is in agreement with the current  care plan.  All questions were answered. The patient knows to call the clinic with any problems, questions or concerns. We can certainly see the patient much sooner if necessary.  Disclaimer: This note was dictated with voice recognition software. Similar sounding words can inadvertently be transcribed and may not be corrected upon review.

## 2015-10-17 NOTE — Telephone Encounter (Signed)
Gave pt apt & avs

## 2015-10-24 DIAGNOSIS — C61 Malignant neoplasm of prostate: Secondary | ICD-10-CM | POA: Diagnosis not present

## 2015-10-24 DIAGNOSIS — C7951 Secondary malignant neoplasm of bone: Secondary | ICD-10-CM | POA: Diagnosis not present

## 2015-10-24 DIAGNOSIS — Z0000 Encounter for general adult medical examination without abnormal findings: Secondary | ICD-10-CM | POA: Diagnosis not present

## 2015-10-24 DIAGNOSIS — C779 Secondary and unspecified malignant neoplasm of lymph node, unspecified: Secondary | ICD-10-CM | POA: Diagnosis not present

## 2015-10-26 ENCOUNTER — Other Ambulatory Visit: Payer: Self-pay | Admitting: Endocrinology

## 2015-11-08 ENCOUNTER — Other Ambulatory Visit: Payer: Self-pay | Admitting: Endocrinology

## 2015-12-01 ENCOUNTER — Other Ambulatory Visit: Payer: Self-pay | Admitting: Endocrinology

## 2015-12-08 ENCOUNTER — Encounter: Payer: Self-pay | Admitting: Endocrinology

## 2015-12-26 ENCOUNTER — Other Ambulatory Visit: Payer: Self-pay | Admitting: Endocrinology

## 2015-12-30 ENCOUNTER — Other Ambulatory Visit: Payer: Self-pay | Admitting: Endocrinology

## 2016-01-05 ENCOUNTER — Encounter: Payer: Self-pay | Admitting: Endocrinology

## 2016-01-05 ENCOUNTER — Ambulatory Visit (INDEPENDENT_AMBULATORY_CARE_PROVIDER_SITE_OTHER): Payer: Medicare Other | Admitting: Endocrinology

## 2016-01-05 VITALS — BP 102/60 | HR 84 | Wt 223.8 lb

## 2016-01-05 DIAGNOSIS — E1129 Type 2 diabetes mellitus with other diabetic kidney complication: Secondary | ICD-10-CM

## 2016-01-05 DIAGNOSIS — IMO0001 Reserved for inherently not codable concepts without codable children: Secondary | ICD-10-CM

## 2016-01-05 DIAGNOSIS — Z794 Long term (current) use of insulin: Principal | ICD-10-CM

## 2016-01-05 MED ORDER — INSULIN LISPRO 100 UNIT/ML (KWIKPEN): PEN_INJECTOR | SUBCUTANEOUS | 11 refills | Status: DC

## 2016-01-05 NOTE — Progress Notes (Signed)
Subjective:    Patient ID: Devin [name], male    DOB: [birth year], 75 y.o.   MRN: [id number]  HPI Pt returns for f/u of diabetes mellitus: DM type: Insulin-requiring type 2.  Dx'ed: 1997.  Complications: polyneuropathy Therapy: insulin since 2002 DKA: never. Severe hypoglycemia: never. Pancreatitis: never Other: he takes multiple daily injections.  Interval history: no cbg record, but states cbg's are well-controlled.  There is no trend throughout the day.  He never misses the insulin.   Past Medical History:  Diagnosis Date  . Ankylosing spondylitis ([place])    Diagnosed during lumbar fracture summer of 2016    . Crohn's disease ([place])   . Diabetic neuropathy ([place])   . Gout   . HIstory of basal cell cancer of face    THYROID CA HX  . History of thyroid cancer    Treated with RAI and total thyroidectomy 1999    . Hypocalcemia 04/13/2007  . Impotence   . 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 Obesity (BMI 30-39.9)   . Osteoporosis    Pt completed 5 years of fosamax in 2013     . Prostate cancer with recurrence    Treated with prostatectomy with recurrence 2012 with Lupron treatment now him   . Psoriasis   . VENTRAL HERNIA 04/14/2008    Past Surgical History:  Procedure Laterality Date  . CARDIAC CATHETERIZATION  03/17/2003  . HERNIA REPAIR  2009   ventral hernia  . LAPAROTOMY     for small bowel obstruction  . PROSTATECTOMY    . Stress Cardiolite  02/17/2003  . THOROCOTOMY WITH LOBECTOMY  03/16/2015   Procedure: THOROCOTOMY WITH LOBECTOMY;  Surgeon: Ivin Poot, MD;  Location: [place] [facility];  Service: Thoracic;;  . TONSILLECTOMY    . TOTAL THYROIDECTOMY  1997  . Venous Doppler  05/30/2004  . VIDEO ASSISTED THORACOSCOPY Left 03/16/2015   Procedure: VIDEO ASSISTED THORACOSCOPY;  Surgeon: Ivin Poot, MD;  [facility];  Service: Thoracic;  Laterality: Left;    Social History   Social History  . Marital status: Married    Spouse name:  N/A  . Number of children: N/A  . Years of education: N/A   Occupational History  . Retired    Social History Main Topics  . Smoking status: Former Research scientist (life sciences)  . Smokeless tobacco: Never Used  . Alcohol use No  . Drug use: No  . Sexual activity: Not on file   Other Topics Concern  . Not on file   Social History Narrative   Retired Development worker, international aid    Current Outpatient Prescriptions on File Prior to Visit  Medication Sig Dispense Refill  . ASACOL HD 800 MG TBEC Take 1,600 mg by mouth 2 (two) times daily.  3  . atenolol (TENORMIN) 25 MG tablet TAKE 1/2 TABLET BY MOUTH DAILY 45 tablet 0  . B-D ULTRAFINE III SHORT PEN 31G X 8 MM MISC USE FOUR TIMES DAILY AS DIRECTED 400 each 0  . BAYER CONTOUR TEST test strip TEST BLOOD SUGAR TWICE DAILY AS DIRECTED. 200 each 2  . busPIRone (BUSPAR) 15 MG tablet Take 1 tablet (15 mg total) by mouth 2 (two) times daily. 180 tablet 0  . calcium carbonate (OS-CAL) 1250 (500 CA) MG chewable tablet Chew 1 tablet by mouth 2 (two) times daily.    . cholecalciferol (VITAMIN D) 1000 UNITS tablet Take 1,000 Units by mouth 2 (two) times daily.     . Glucose Blood (  ASCENSIA CONTOUR TEST VI) Use as directed to test two times a day     . Insulin Pen Needle (B-D ULTRAFINE III SHORT PEN) 31G X 8 MM MISC 1 each by Other route 4 (four) times daily. 400 each 1  . leuprolide (LUPRON) 11.25 MG injection Inject 11.25 mg into the muscle every 6 (six) months.     . levothyroxine (SYNTHROID, LEVOTHROID) 175 MCG tablet TAKE 1 TABLET BY MOUTH EVERY DAY BEFORE BREAKFAST. 90 tablet 0  . lisinopril-hydrochlorothiazide (PRINZIDE,ZESTORETIC) 20-12.5 MG tablet TAKE 1 TABLET BY MOUTH EVERY DAY 90 tablet 0  . Multiple Vitamins-Minerals (CENTRUM SILVER) tablet Take 1 tablet by mouth daily.     No current facility-administered medications on file prior to visit.     No Known Allergies  Family History  Problem Relation Age of Onset  . Cancer Neg Hx     No cancer in the patient's immediate  family, except of course for the patient himself, as noted    BP 102/60   Pulse 84   Wt 223 lb 12.8 oz (101.5 kg)   SpO2 96%   BMI 33.05 kg/m   Review of Systems He denies hypoglycemia    Objective:   Physical Exam VITAL SIGNS:  See vs page GENERAL: no distress Pulses: dorsalis pedis intact bilat.   MSK: no deformity of the feet.  CV: 1+ bilat leg edema.  Skin:  no ulcer on the feet.  normal color and temp on the feet. Neuro: sensation is intact to touch on the feet, but decreased from normal.   Lab Results  Component Value Date   HGBA1C 6.2 09/05/2015      Assessment & Plan:  Insulin-requiring type 2 DM: overcontrolled.    Patient is advised the following: Patient Instructions  check your blood sugar twice a day.  vary the time of day when you check, between before the 3 meals, and at bedtime.  also check if you have symptoms of your blood sugar being too high or too low.  please keep a record of the readings and bring it to your next appointment here (or you can bring the meter itself).  You can write it on any piece of paper.  please call us sooner if your blood sugar goes below 70, or if you have a lot of readings over 200. Please reduce the huamlog to 3 times a day (just before each meal) 20-15-25 units Please come back for a regular physical appointment in 3 months (must be after 05/07/16).    Renato Shin, MD

## 2016-01-05 NOTE — Patient Instructions (Addendum)
check your blood sugar twice a day.  vary the time of day when you check, between before the 3 meals, and at bedtime.  also check if you have symptoms of your blood sugar being too high or too low.  please keep a record of the readings and bring it to your next appointment here (or you can bring the meter itself).  You can write it on any piece of paper.  please call us sooner if your blood sugar goes below 70, or if you have a lot of readings over 200. Please reduce the huamlog to 3 times a day (just before each meal) 20-15-25 units Please come back for a regular physical appointment in 3 months (must be after 05/07/16).

## 2016-01-10 ENCOUNTER — Ambulatory Visit (HOSPITAL_COMMUNITY)
Admission: RE | Admit: 2016-01-10 | Discharge: 2016-01-10 | Disposition: A | Discharge status: Home / Self Care | Payer: Medicare Other | Source: Ambulatory Visit | Attending: Internal Medicine | Admitting: Internal Medicine

## 2016-01-10 ENCOUNTER — Encounter (HOSPITAL_COMMUNITY): Payer: Self-pay

## 2016-01-10 ENCOUNTER — Other Ambulatory Visit (HOSPITAL_BASED_OUTPATIENT_CLINIC_OR_DEPARTMENT_OTHER): Payer: Medicare Other

## 2016-01-10 DIAGNOSIS — C3412 Malignant neoplasm of upper lobe, left bronchus or lung: Secondary | ICD-10-CM | POA: Diagnosis present

## 2016-01-10 DIAGNOSIS — Z902 Acquired absence of lung [part of]: Secondary | ICD-10-CM | POA: Diagnosis not present

## 2016-01-10 DIAGNOSIS — C3492 Malignant neoplasm of unspecified part of left bronchus or lung: Secondary | ICD-10-CM

## 2016-01-10 DIAGNOSIS — R918 Other nonspecific abnormal finding of lung field: Secondary | ICD-10-CM | POA: Diagnosis not present

## 2016-01-10 LAB — CBC WITH DIFFERENTIAL/PLATELET
BASO%: 0.2 % (ref 0.0–2.0)
Basophils Absolute: 0 10*3/uL (ref 0.0–0.1)
EOS%: 2.3 % (ref 0.0–7.0)
Eosinophils Absolute: 0.2 10*3/uL (ref 0.0–0.5)
HCT: 39.3 % (ref 38.4–49.9)
HGB: 13.2 g/dL (ref 13.0–17.1)
LYMPH%: 20.6 % (ref 14.0–49.0)
MCH: 31.5 pg (ref 27.2–33.4)
MCHC: 33.6 g/dL (ref 32.0–36.0)
MCV: 93.8 fL (ref 79.3–98.0)
MONO#: 0.7 10*3/uL (ref 0.1–0.9)
MONO%: 7.2 % (ref 0.0–14.0)
NEUT#: 6.7 10*3/uL — ABNORMAL HIGH (ref 1.5–6.5)
NEUT%: 69.7 % (ref 39.0–75.0)
Platelets: 176 10*3/uL (ref 140–400)
RBC: 4.19 10*6/uL — ABNORMAL LOW (ref 4.20–5.82)
RDW: 13.1 % (ref 11.0–14.6)
WBC: 9.6 10*3/uL (ref 4.0–10.3)
lymph#: 2 10*3/uL (ref 0.9–3.3)

## 2016-01-10 LAB — COMPREHENSIVE METABOLIC PANEL
ALT: 37 U/L (ref 0–55)
AST: 33 U/L (ref 5–34)
Albumin: 3.7 g/dL (ref 3.5–5.0)
Alkaline Phosphatase: 93 U/L (ref 40–150)
Anion Gap: 9 mEq/L (ref 3–11)
BUN: 26.4 mg/dL — ABNORMAL HIGH (ref 7.0–26.0)
CO2: 25 mEq/L (ref 22–29)
Calcium: 10.1 mg/dL (ref 8.4–10.4)
Chloride: 108 mEq/L (ref 98–109)
Creatinine: 1.5 mg/dL — ABNORMAL HIGH (ref 0.7–1.3)
EGFR: 46 mL/min/{1.73_m2} — ABNORMAL LOW (ref >90)
Glucose: 108 mg/dl (ref 70–140)
Potassium: 4.3 mEq/L (ref 3.5–5.1)
Sodium: 142 mEq/L (ref 136–145)
Total Bilirubin: 0.43 mg/dL (ref 0.20–1.20)
Total Protein: 7 g/dL (ref 6.4–8.3)

## 2016-01-17 ENCOUNTER — Encounter: Payer: Self-pay | Admitting: Internal Medicine

## 2016-01-17 ENCOUNTER — Telehealth: Payer: Self-pay | Admitting: Internal Medicine

## 2016-01-17 ENCOUNTER — Ambulatory Visit (HOSPITAL_BASED_OUTPATIENT_CLINIC_OR_DEPARTMENT_OTHER): Payer: Medicare Other | Admitting: Internal Medicine

## 2016-01-17 VITALS — BP 125/55 | HR 77 | Temp 98.2°F | Resp 18 | Ht 69.0 in | Wt 223.4 lb

## 2016-01-17 DIAGNOSIS — C3412 Malignant neoplasm of upper lobe, left bronchus or lung: Secondary | ICD-10-CM | POA: Diagnosis not present

## 2016-01-17 DIAGNOSIS — C3492 Malignant neoplasm of unspecified part of left bronchus or lung: Secondary | ICD-10-CM

## 2016-01-17 NOTE — Progress Notes (Signed)
[place] [phone]   [phone]  OFFICE PROGRESS NOTE  Renato Shin, MD [address] [place]  [address]  DIAGNOSIS: Stage IA (T1b, N0, M0) non-small cell lung cancer, adenocarcinoma presenting with left upper lobe pulmonary nodule diagnosed in September 2016.  PRIOR THERAPY: Status post left VATS with left upper lobectomy and mediastinal lymph node dissection under the care of Dr. Prescott Gum on 03/16/2015.  CURRENT THERAPY: Observation.  INTERVAL HISTORY: Devin [name] 75 y.o. male returns to the clinic today for six-month follow-up visit. The patient has been on observation for the last 12 months. He has no complaints today. He denied having any significant chest pain, shortness of breath, cough or hemoptysis. He denied having any significant weight loss or night sweats. He has no nausea or vomiting. No fever or chills. He had repeat CT scan of the chest performed recently and he is here for evaluation and discussion of his scan results.  MEDICAL HISTORY: Past Medical History:  Diagnosis Date  . Ankylosing spondylitis ([place])    Diagnosed during lumbar fracture summer of 2016    . Crohn's disease ([place])   . Diabetic neuropathy ([place])   . Gout   . HIstory of basal cell cancer of face    THYROID CA HX  . History of thyroid cancer    Treated with RAI and total thyroidectomy 1999    . Hypocalcemia 04/13/2007  . Impotence   . 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 Obesity (BMI 30-39.9)   . Osteoporosis    Pt completed 5 years of fosamax in 2013     . Prostate cancer with recurrence    Treated with prostatectomy with recurrence 2012 with Lupron treatment now him   . Psoriasis   . VENTRAL HERNIA 04/14/2008    ALLERGIES:  has No Known Allergies.  MEDICATIONS:  Current Outpatient Prescriptions  Medication Sig Dispense Refill  . ASACOL HD 800 MG TBEC Take 1,600 mg by mouth 2 (two) times  daily.  3  . atenolol (TENORMIN) 25 MG tablet TAKE 1/2 TABLET BY MOUTH DAILY 45 tablet 0  . B-D ULTRAFINE III SHORT PEN 31G X 8 MM MISC USE FOUR TIMES DAILY AS DIRECTED 400 each 0  . BAYER CONTOUR TEST test strip TEST BLOOD SUGAR TWICE DAILY AS DIRECTED. 200 each 2  . busPIRone (BUSPAR) 15 MG tablet Take 1 tablet (15 mg total) by mouth 2 (two) times daily. 180 tablet 0  . calcium carbonate (OS-CAL) 1250 (500 CA) MG chewable tablet Chew 1 tablet by mouth 2 (two) times daily.    . cholecalciferol (VITAMIN D) 1000 UNITS tablet Take 1,000 Units by mouth 2 (two) times daily.     . Glucose Blood (ASCENSIA CONTOUR TEST VI) Use as directed to test two times a day     . insulin lispro (HUMALOG KWIKPEN) 100 UNIT/ML KiwkPen 3 times a day (just before each meal) 20-15-25 units 30 mL 11  . Insulin Pen Needle (B-D ULTRAFINE III SHORT PEN) 31G X 8 MM MISC 1 each by Other route 4 (four) times daily. 400 each 1  . leuprolide (LUPRON) 11.25 MG injection Inject 11.25 mg into the muscle every 6 (six) months.     . levothyroxine (SYNTHROID, LEVOTHROID) 175 MCG tablet TAKE 1 TABLET BY MOUTH EVERY DAY BEFORE BREAKFAST. 90 tablet 0  . lisinopril-hydrochlorothiazide (PRINZIDE,ZESTORETIC) 20-12.5 MG tablet TAKE 1 TABLET BY MOUTH EVERY DAY 90 tablet  0  . Multiple Vitamins-Minerals (CENTRUM SILVER) tablet Take 1 tablet by mouth daily.     No current facility-administered medications for this visit.     SURGICAL HISTORY:  Past Surgical History:  Procedure Laterality Date  . CARDIAC CATHETERIZATION  03/17/2003  . HERNIA REPAIR  2009   ventral hernia  . LAPAROTOMY     for small bowel obstruction  . PROSTATECTOMY    . Stress Cardiolite  02/17/2003  . THOROCOTOMY WITH LOBECTOMY  03/16/2015   Procedure: THOROCOTOMY WITH LOBECTOMY;  Surgeon: Ivin Poot, MD;  Location: [facility];  Service: Thoracic;;  . TONSILLECTOMY    . TOTAL THYROIDECTOMY  1997  . Venous Doppler  05/30/2004  . VIDEO ASSISTED THORACOSCOPY Left  03/16/2015   Procedure: VIDEO ASSISTED THORACOSCOPY;  Surgeon: Ivin Poot, MD;  Location: [facility] OR;  Service: Thoracic;  Laterality: Left;    REVIEW OF SYSTEMS:  A comprehensive review of systems was negative.   PHYSICAL EXAMINATION: General appearance: alert, cooperative and no distress Head: Normocephalic, without obvious abnormality, atraumatic Neck: no adenopathy, no JVD, supple, symmetrical, trachea midline and thyroid not enlarged, symmetric, no tenderness/mass/nodules Lymph nodes: Cervical, supraclavicular, and axillary nodes normal. Resp: clear to auscultation bilaterally Back: symmetric, no curvature. ROM normal. No CVA tenderness. Cardio: regular rate and rhythm, S1, S2 normal, no murmur, click, rub or gallop GI: soft, non-tender; bowel sounds normal; no masses,  no organomegaly Extremities: extremities normal, atraumatic, no cyanosis or edema  ECOG PERFORMANCE STATUS: 1 - Symptomatic but completely ambulatory  Blood pressure (!) 125/55, pulse 77, temperature 98.2 F (36.8 C), temperature source Oral, resp. rate 18, height '5\' 9"'$  (1.753 m), weight 223 lb 6.4 oz (101.3 kg), SpO2 98 %.  LABORATORY DATA: Lab Results  Component Value Date   WBC 9.6 01/10/2016   HGB 13.2 01/10/2016   HCT 39.3 01/10/2016   MCV 93.8 01/10/2016   PLT 176 01/10/2016      Chemistry      Component Value Date/Time   NA 142 01/10/2016 0942   K 4.3 01/10/2016 0942   CL 102 03/18/2015 0420   CO2 25 01/10/2016 0942   BUN 26.4 (H) 01/10/2016 0942   CREATININE 1.5 (H) 01/10/2016 0942      Component Value Date/Time   CALCIUM 10.1 01/10/2016 0942   ALKPHOS 93 01/10/2016 0942   AST 33 01/10/2016 0942   ALT 37 01/10/2016 0942   BILITOT 0.43 01/10/2016 0942       RADIOGRAPHIC STUDIES: Ct Chest Wo Contrast  Result Date: 01/10/2016 CLINICAL DATA:  Status post left upper lobectomy. EXAM: CT CHEST WITHOUT CONTRAST TECHNIQUE: Multidetector CT imaging of the chest was performed following the  standard protocol without IV contrast. COMPARISON:  10/10/15 FINDINGS: Cardiovascular: Heart size is normal.  No pericardial effusion. Mediastinum/Nodes: Normal appearance of the trachea and esophagus. No mediastinal or hilar adenopathy. No axillary or supraclavicular adenopathy. Lungs/Pleura: Status post left upper lobectomy. Stable appearance of CED of small nodular density in the left upper lung zone measuring 4 mm, image 11 of series 4.No new or enlarging suspicious pulmonary nodule or mass identified. Upper Abdomen: The adrenal glands are normal. There is a stone within the gallbladder neck which measures 11 mm. Right renal calculus is also noted measuring 4 mm. The visualized portions of the pancreas and spleen are normal. Musculoskeletal: No aggressive lytic or sclerotic bone lesions. The patient is status post posterior hardware fixation of the lower thoracic and lumbar spine. IMPRESSION: 1. Stable CT of the  chest. No definite evidence for local tumor recurrence or metastatic disease status post left upper lobectomy. Electronically Signed   By: Kerby Moors M.D.   On: 01/10/2016 12:24    ASSESSMENT AND PLAN: This is a very pleasant 75 years old white male with history of stage IA non-small cell lung cancer, adenocarcinoma status post left upper lobectomy under the care of Dr. Prescott Gum in November 2016. The patient is doing fine today with no specific complaints.  The recent CT scan of the chest showed no evidence for disease recurrence. I discussed the scan results with the patient today. I recommended for him to continue on observation with repeat CT scan of the chest in 6 months. He was advised to call immediately if he has any concerning symptoms in the interval. The patient was advised to call immediately if he has any concerning symptoms in the interval. The patient voices understanding of current disease status and treatment options and is in agreement with the current care plan.  All  questions were answered. The patient knows to call the clinic with any problems, questions or concerns. We can certainly see the patient much sooner if necessary.  Disclaimer: This note was dictated with voice recognition software. Similar sounding words can inadvertently be transcribed and may not be corrected upon review.

## 2016-01-17 NOTE — Telephone Encounter (Signed)
per pof to sch pt appt-pt req Ct in March-gave avs/cal

## 2016-01-25 ENCOUNTER — Telehealth: Payer: Self-pay | Admitting: Endocrinology

## 2016-01-25 NOTE — Telephone Encounter (Signed)
[facility] today Ov here tomorrow

## 2016-01-25 NOTE — Telephone Encounter (Signed)
I contacted the pt and advised of MD's instructions pt scheduled for tomorrow at 915 am.

## 2016-01-25 NOTE — Telephone Encounter (Signed)
Patient's wife called because the patient has fallen and they want to know what to do about it.  He fell on his back and hip and she's afraid because the patient broke his back last year.  Please advise.  Can call home phone or [phone].

## 2016-01-25 NOTE — Telephone Encounter (Signed)
See note below and please advise, Thanks! 

## 2016-01-26 ENCOUNTER — Ambulatory Visit
Admission: RE | Admit: 2016-01-26 | Discharge: 2016-01-26 | Disposition: A | Discharge status: Home / Self Care | Payer: Medicare Other | Source: Ambulatory Visit | Attending: Endocrinology | Admitting: Endocrinology

## 2016-01-26 ENCOUNTER — Ambulatory Visit (INDEPENDENT_AMBULATORY_CARE_PROVIDER_SITE_OTHER): Payer: Medicare Other | Admitting: Endocrinology

## 2016-01-26 ENCOUNTER — Encounter: Payer: Self-pay | Admitting: Endocrinology

## 2016-01-26 ENCOUNTER — Telehealth: Payer: Self-pay | Admitting: Endocrinology

## 2016-01-26 DIAGNOSIS — M25551 Pain in right hip: Secondary | ICD-10-CM

## 2016-01-26 DIAGNOSIS — M545 Low back pain, unspecified: Secondary | ICD-10-CM

## 2016-01-26 DIAGNOSIS — M25559 Pain in unspecified hip: Secondary | ICD-10-CM | POA: Insufficient documentation

## 2016-01-26 DIAGNOSIS — M549 Dorsalgia, unspecified: Secondary | ICD-10-CM | POA: Insufficient documentation

## 2016-01-26 DIAGNOSIS — M533 Sacrococcygeal disorders, not elsewhere classified: Secondary | ICD-10-CM | POA: Diagnosis not present

## 2016-01-26 MED ORDER — HYDROCODONE-ACETAMINOPHEN 5-325 MG PO TABS: 1.0000 | ORAL_TABLET | Freq: Four times a day (QID) | ORAL | 0 refills | Status: DC | PRN

## 2016-01-26 MED ORDER — CYCLOBENZAPRINE HCL 10 MG PO TABS: 10.0000 mg | ORAL_TABLET | Freq: Three times a day (TID) | ORAL | 0 refills | Status: DC | PRN

## 2016-01-26 NOTE — Telephone Encounter (Signed)
PT wife concerned because they haven't heard back from the [facility] yet and was told that she would hear back by lunch. Please call back.

## 2016-01-26 NOTE — Progress Notes (Signed)
Subjective:    Patient ID: Devin [name], male    DOB: March 31, 1941, 75 y.o.   MRN: [id number]  HPI Pt states 2 days of moderate pain at the lower back, and assoc pain at the right hip.  This started when he fell from his truck.   Past Medical History:  Diagnosis Date  . Adenocarcinoma of left lung, stage 1 ([place]) 03/10/2015  . Ankylosing spondylitis ([place])    Diagnosed during lumbar fracture summer of 2016    . Crohn's disease ([place])   . Diabetic neuropathy ([place])   . Gout   . HIstory of basal cell cancer of face    THYROID CA HX  . History of thyroid cancer    Treated with RAI and total thyroidectomy 1999    . Hypocalcemia 04/13/2007  . Impotence   . 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 Obesity (BMI 30-39.9)   . Osteoporosis    Pt completed 5 years of fosamax in 2013     . Prostate cancer with recurrence    Treated with prostatectomy with recurrence 2012 with Lupron treatment now him   . Psoriasis   . VENTRAL HERNIA 04/14/2008    Past Surgical History:  Procedure Laterality Date  . CARDIAC CATHETERIZATION  03/17/2003  . HERNIA REPAIR  2009   ventral hernia  . LAPAROTOMY     for small bowel obstruction  . PROSTATECTOMY    . Stress Cardiolite  02/17/2003  . THOROCOTOMY WITH LOBECTOMY  03/16/2015   Procedure: THOROCOTOMY WITH LOBECTOMY;  Surgeon: Ivin Poot, MD;  Location: [facility];  Service: Thoracic;;  . TONSILLECTOMY    . TOTAL THYROIDECTOMY  1997  . Venous Doppler  05/30/2004  . VIDEO ASSISTED THORACOSCOPY Left 03/16/2015   Procedure: VIDEO ASSISTED THORACOSCOPY;  Surgeon: Ivin Poot, MD;  Location: [facility] OR;  Service: Thoracic;  Laterality: Left;    Social History   Social History  . Marital status: Married    Spouse name: N/A  . Number of children: N/A  . Years of education: N/A   Occupational History  . Retired    Social History Main Topics  . Smoking status: Former Research scientist (life sciences)  . Smokeless tobacco: Never Used  . Alcohol use No    . Drug use: No  . Sexual activity: Not on file   Other Topics Concern  . Not on file   Social History Narrative   Retired Development worker, international aid    Current Outpatient Prescriptions on File Prior to Visit  Medication Sig Dispense Refill  . ASACOL HD 800 MG TBEC Take 1,600 mg by mouth 2 (two) times daily.  3  . atenolol (TENORMIN) 25 MG tablet TAKE 1/2 TABLET BY MOUTH DAILY 45 tablet 0  . B-D ULTRAFINE III SHORT PEN 31G X 8 MM MISC USE FOUR TIMES DAILY AS DIRECTED 400 each 0  . BAYER CONTOUR TEST test strip TEST BLOOD SUGAR TWICE DAILY AS DIRECTED. 200 each 2  . busPIRone (BUSPAR) 15 MG tablet Take 1 tablet (15 mg total) by mouth 2 (two) times daily. 180 tablet 0  . calcium carbonate (OS-CAL) 1250 (500 CA) MG chewable tablet Chew 1 tablet by mouth 2 (two) times daily.    . cholecalciferol (VITAMIN D) 1000 UNITS tablet Take 1,000 Units by mouth 2 (two) times daily.     . Glucose Blood (ASCENSIA CONTOUR TEST VI) Use as directed to test two times a day     . insulin lispro (HUMALOG KWIKPEN)  100 UNIT/ML KiwkPen 3 times a day (just before each meal) 20-15-25 units 30 mL 11  . Insulin Pen Needle (B-D ULTRAFINE III SHORT PEN) 31G X 8 MM MISC 1 each by Other route 4 (four) times daily. 400 each 1  . leuprolide (LUPRON) 11.25 MG injection Inject 11.25 mg into the muscle every 6 (six) months.     . levothyroxine (SYNTHROID, LEVOTHROID) 175 MCG tablet TAKE 1 TABLET BY MOUTH EVERY DAY BEFORE BREAKFAST. 90 tablet 0  . lisinopril-hydrochlorothiazide (PRINZIDE,ZESTORETIC) 20-12.5 MG tablet TAKE 1 TABLET BY MOUTH EVERY DAY 90 tablet 0  . Multiple Vitamins-Minerals (CENTRUM SILVER) tablet Take 1 tablet by mouth daily.     No current facility-administered medications on file prior to visit.     No Known Allergies  Family History  Problem Relation Age of Onset  . Cancer Neg Hx     No cancer in the patient's immediate family, except of course for the patient himself, as noted    BP 132/64   Pulse 93   Ht 5'  9" (1.753 m)   Wt 219 lb (99.3 kg)   SpO2 97%   BMI 32.34 kg/m   Review of Systems Denies LOC.  Denies bowel or bladder retention.     Objective:   Physical Exam VITAL SIGNS:  See vs page GENERAL: no distress Spine: nontender.  Right hip: nontender. Gait: stable with a cane.  Favors RLE.       Assessment & Plan:  Back and hip contusion, new.  I rx'ed vicodin short-term.

## 2016-01-26 NOTE — Patient Instructions (Signed)
X-rays are requested for you today.  We'll let you know about the results. I have sent a prescription to your pharmacy, for a muscle relaxant. I hope you feel better soon.  If you don't feel better by next week, please call back.  Please call sooner if you get worse.

## 2016-01-26 NOTE — Telephone Encounter (Signed)
I contacted the pt's wife and advised the x-ray showed no broken bones or fractures. She voiced understanding .

## 2016-01-29 ENCOUNTER — Telehealth: Payer: Self-pay | Admitting: Endocrinology

## 2016-01-29 DIAGNOSIS — M545 Low back pain, unspecified: Secondary | ICD-10-CM

## 2016-01-29 NOTE — Telephone Encounter (Signed)
This should be f/u by ortho.  Do you want to call, or for [place] to make a referral?

## 2016-01-29 NOTE — Telephone Encounter (Signed)
I contacted the pt's wife and advised of MD's advise. Pt's wife voiced understanding and requested th referral to be placed to [facility].

## 2016-01-29 NOTE — Telephone Encounter (Signed)
Ok, I did referral 

## 2016-01-29 NOTE — Telephone Encounter (Signed)
See note below and please advise, Thanks! 

## 2016-01-29 NOTE — Telephone Encounter (Signed)
Pt is coming to pick up rx and he is still in the a lot of pain should he make an appt.

## 2016-01-31 ENCOUNTER — Emergency Department (HOSPITAL_COMMUNITY)
Admission: EM | Admit: 2016-01-31 | Discharge: 2016-01-31 | Disposition: A | Discharge status: Home / Self Care | Payer: Medicare Other | Attending: Emergency Medicine | Admitting: Emergency Medicine

## 2016-01-31 ENCOUNTER — Emergency Department (HOSPITAL_COMMUNITY): Payer: Medicare Other

## 2016-01-31 ENCOUNTER — Encounter (HOSPITAL_COMMUNITY): Payer: Self-pay | Admitting: Adult Health

## 2016-01-31 DIAGNOSIS — Z794 Long term (current) use of insulin: Secondary | ICD-10-CM | POA: Insufficient documentation

## 2016-01-31 DIAGNOSIS — Z85828 Personal history of other malignant neoplasm of skin: Secondary | ICD-10-CM | POA: Diagnosis not present

## 2016-01-31 DIAGNOSIS — Z87891 Personal history of nicotine dependence: Secondary | ICD-10-CM | POA: Insufficient documentation

## 2016-01-31 DIAGNOSIS — Y92410 Unspecified street and highway as the place of occurrence of the external cause: Secondary | ICD-10-CM | POA: Diagnosis not present

## 2016-01-31 DIAGNOSIS — Z85850 Personal history of malignant neoplasm of thyroid: Secondary | ICD-10-CM | POA: Insufficient documentation

## 2016-01-31 DIAGNOSIS — Z8546 Personal history of malignant neoplasm of prostate: Secondary | ICD-10-CM | POA: Diagnosis not present

## 2016-01-31 DIAGNOSIS — Y939 Activity, unspecified: Secondary | ICD-10-CM | POA: Diagnosis not present

## 2016-01-31 DIAGNOSIS — S299XXA Unspecified injury of thorax, initial encounter: Secondary | ICD-10-CM | POA: Diagnosis present

## 2016-01-31 DIAGNOSIS — R1084 Generalized abdominal pain: Secondary | ICD-10-CM | POA: Diagnosis not present

## 2016-01-31 DIAGNOSIS — M545 Low back pain, unspecified: Secondary | ICD-10-CM

## 2016-01-31 DIAGNOSIS — E1129 Type 2 diabetes mellitus with other diabetic kidney complication: Secondary | ICD-10-CM | POA: Insufficient documentation

## 2016-01-31 DIAGNOSIS — K8020 Calculus of gallbladder without cholecystitis without obstruction: Secondary | ICD-10-CM | POA: Diagnosis not present

## 2016-01-31 DIAGNOSIS — W1789XA Other fall from one level to another, initial encounter: Secondary | ICD-10-CM | POA: Insufficient documentation

## 2016-01-31 DIAGNOSIS — Y999 Unspecified external cause status: Secondary | ICD-10-CM | POA: Diagnosis not present

## 2016-01-31 DIAGNOSIS — S22079A Unspecified fracture of T9-T10 vertebra, initial encounter for closed fracture: Secondary | ICD-10-CM | POA: Diagnosis not present

## 2016-01-31 DIAGNOSIS — R1010 Upper abdominal pain, unspecified: Secondary | ICD-10-CM | POA: Diagnosis not present

## 2016-01-31 DIAGNOSIS — E1140 Type 2 diabetes mellitus with diabetic neuropathy, unspecified: Secondary | ICD-10-CM | POA: Insufficient documentation

## 2016-01-31 LAB — COMPREHENSIVE METABOLIC PANEL
ALT: 18 U/L (ref 17–63)
AST: 29 U/L (ref 15–41)
Albumin: 3.9 g/dL (ref 3.5–5.0)
Alkaline Phosphatase: 97 U/L (ref 38–126)
Anion gap: 13 (ref 5–15)
BUN: 24 mg/dL — ABNORMAL HIGH (ref 6–20)
CO2: 27 mmol/L (ref 22–32)
Calcium: 9.8 mg/dL (ref 8.9–10.3)
Chloride: 95 mmol/L — ABNORMAL LOW (ref 101–111)
Creatinine, Ser: 1.7 mg/dL — ABNORMAL HIGH (ref 0.61–1.24)
GFR calc Af Amer: 44 mL/min — ABNORMAL LOW (ref >60)
GFR calc non Af Amer: 38 mL/min — ABNORMAL LOW (ref >60)
Glucose, Bld: 272 mg/dL — ABNORMAL HIGH (ref 65–99)
Potassium: 4.6 mmol/L (ref 3.5–5.1)
Sodium: 135 mmol/L (ref 135–145)
Total Bilirubin: 0.8 mg/dL (ref 0.3–1.2)
Total Protein: 7.1 g/dL (ref 6.5–8.1)

## 2016-01-31 LAB — CBC
HCT: 44.3 % (ref 39.0–52.0)
Hemoglobin: 14.8 g/dL (ref 13.0–17.0)
MCH: 31.6 pg (ref 26.0–34.0)
MCHC: 33.4 g/dL (ref 30.0–36.0)
MCV: 94.5 fL (ref 78.0–100.0)
Platelets: 299 10*3/uL (ref 150–400)
RBC: 4.69 MIL/uL (ref 4.22–5.81)
RDW: 12.4 % (ref 11.5–15.5)
WBC: 11.8 10*3/uL — ABNORMAL HIGH (ref 4.0–10.5)

## 2016-01-31 LAB — CBG MONITORING, ED: Glucose-Capillary: 182 mg/dL — ABNORMAL HIGH (ref 65–99)

## 2016-01-31 LAB — LIPASE, BLOOD: Lipase: 21 U/L (ref 11–51)

## 2016-01-31 MED ORDER — ONDANSETRON HCL 4 MG PO TABS: 4.0000 mg | ORAL_TABLET | Freq: Three times a day (TID) | ORAL | 0 refills | Status: DC | PRN

## 2016-01-31 MED ORDER — SENNOSIDES-DOCUSATE SODIUM 8.6-50 MG PO TABS: 1.0000 | ORAL_TABLET | Freq: Every day | ORAL | 0 refills | Status: DC

## 2016-01-31 MED ORDER — DIATRIZOATE MEGLUMINE & SODIUM 66-10 % PO SOLN
ORAL | Status: AC
  Filled 2016-01-31: qty 30

## 2016-01-31 MED ORDER — ONDANSETRON HCL 4 MG/2ML IJ SOLN
4.0000 mg | Freq: Once | INTRAMUSCULAR | Status: AC
  Administered 2016-01-31: 4 mg via INTRAVENOUS
  Filled 2016-01-31: qty 2

## 2016-01-31 MED ORDER — HYDROCODONE-ACETAMINOPHEN 5-325 MG PO TABS
1.0000 | ORAL_TABLET | Freq: Once | ORAL | Status: AC
  Administered 2016-01-31: 1 via ORAL
  Filled 2016-01-31: qty 1

## 2016-01-31 MED ORDER — OXYCODONE-ACETAMINOPHEN 5-325 MG PO TABS: 2.0000 | ORAL_TABLET | ORAL | 0 refills | Status: DC | PRN

## 2016-01-31 MED ORDER — FENTANYL CITRATE (PF) 100 MCG/2ML IJ SOLN
50.0000 ug | Freq: Once | INTRAMUSCULAR | Status: AC
  Administered 2016-01-31: 50 ug via INTRAVENOUS
  Filled 2016-01-31: qty 2

## 2016-01-31 NOTE — ED Notes (Signed)
Pt to CT

## 2016-01-31 NOTE — ED Notes (Signed)
[facility]- 182 

## 2016-01-31 NOTE — ED Triage Notes (Addendum)
Presents with  Lower back pain began last Wednesday associated with right lower abdominal pain, endorses nasuea and vomiting. Pain began after a fall on to back. paijn in abdomen comes and goes-he had a ct scan last week that found a small gallstone in neck of gall bladder, this was before the fall the CT was done. Denies numbness and tingling down back of leg. Denies B&B incontinence. Movement makes back pain worse, hhydrocodone and muscle relaxers make pain better.

## 2016-01-31 NOTE — ED Provider Notes (Signed)
[facility] DEPT Provider Note   CSN: [id number] Arrival date & time: 01/31/16  1344     History   Chief Complaint Chief Complaint  Patient presents with  . Back Pain    HPI Devin [name] is a 75 y.o. male.  HPI   Patient presents with back pain and intermittent abdominal pain x 1 week.  Associated nausea.  Pt states he fell out of his truck when the handlebar broke 1 week ago, causing him to fall backwards onto his right hip and lower back.  He has had negative xrays with his PCP and is taking norco that somewhat helps the pain.  The abdominal pain has developed since, has increased nausea when he is up and walking around, but also when he attempted to eat dinner last night.  Recent CT chest for cancer surveillance showed gallstone in the gallbladder neck.  Denies fevers, CP, SOB, urinary symptoms, bowel changes.  Denies weakness or numbness of the lower extremities, loss of control of bowel or bladder.   Past Medical History:  Diagnosis Date  . Adenocarcinoma of left lung, stage 1 ([place]) 03/10/2015  . Ankylosing spondylitis ([place])    Diagnosed during lumbar fracture summer of 2016    . Crohn's disease ([place])   . Diabetic neuropathy ([place])   . Gout   . HIstory of basal cell cancer of face    THYROID CA HX  . History of thyroid cancer    Treated with RAI and total thyroidectomy 1999    . Hypocalcemia 04/13/2007  . Impotence   . 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 Obesity (BMI 30-39.9)   . Osteoporosis    Pt completed 5 years of fosamax in 2013     . Prostate cancer with recurrence    Treated with prostatectomy with recurrence 2012 with Lupron treatment now him   . Psoriasis   . VENTRAL HERNIA 04/14/2008    Patient Active Problem List   Diagnosis Date Noted  . Back pain 01/26/2016  . Hip pain 01/26/2016  . S/P lobectomy of lung 03/16/2015  . Adenocarcinoma of left lung, stage 1 ([place]) 03/10/2015  . Diabetic neuropathy ([place])   .  Ankylosing spondylitis ([place])   . Obesity (BMI 30-39.9)   . Crohn's disease ([place])   . Lung nodule 02/23/2015  . HIstory of basal cell cancer of face   . History of thyroid cancer   . Postsurgical hypothyroidism   . Gout 12/29/2006  . Essential hypertension 12/29/2006  . Osteoporosis 12/29/2006  . 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 Prostate cancer with recurrence     Past Surgical History:  Procedure Laterality Date  . CARDIAC CATHETERIZATION  03/17/2003  . HERNIA REPAIR  2009   ventral hernia  . LAPAROTOMY     for small bowel obstruction  . PROSTATECTOMY    . Stress Cardiolite  02/17/2003  . THOROCOTOMY WITH LOBECTOMY  03/16/2015   Procedure: THOROCOTOMY WITH LOBECTOMY;  Surgeon: Ivin Poot, MD;  Location: [facility];  Service: Thoracic;;  . TONSILLECTOMY    . TOTAL THYROIDECTOMY  1997  . Venous Doppler  05/30/2004  . VIDEO ASSISTED THORACOSCOPY Left 03/16/2015   Procedure: VIDEO ASSISTED THORACOSCOPY;  Surgeon: Ivin Poot, MD;  Location: [facility];  Service: Thoracic;  Laterality: Left;       Home Medications    Prior to Admission medications   Medication Sig Start Date End Date Taking? Authorizing Provider  ASACOL HD 800 MG TBEC Take 1,600 mg by mouth 2 (two) times daily. 11/14/14   Historical Provider, MD  atenolol (TENORMIN) 25 MG tablet TAKE 1/2 TABLET BY MOUTH DAILY 12/31/15   Renato Shin, MD  B-D ULTRAFINE III SHORT PEN 31G X 8 MM MISC USE FOUR TIMES DAILY AS DIRECTED 12/01/15   Renato Shin, MD  BAYER CONTOUR TEST test strip TEST BLOOD SUGAR TWICE DAILY AS DIRECTED. 05/08/15   Renato Shin, MD  busPIRone (BUSPAR) 15 MG tablet Take 1 tablet (15 mg total) by mouth 2 (two) times daily. 09/28/15   Renato Shin, MD  calcium carbonate (OS-CAL) 1250 (500 CA) MG chewable tablet Chew 1 tablet by mouth 2 (two) times daily.    Historical Provider, MD  cholecalciferol (VITAMIN D) 1000 UNITS tablet Take 1,000 Units by mouth 2 (two) times daily.      Historical Provider, MD  cyclobenzaprine (FLEXERIL) 10 MG tablet Take 1 tablet (10 mg total) by mouth 3 (three) times daily as needed for muscle spasms. 01/26/16   Renato Shin, MD  Glucose Blood (ASCENSIA CONTOUR TEST VI) Use as directed to test two times a day     Historical Provider, MD  HYDROcodone-acetaminophen (NORCO/VICODIN) 5-325 MG tablet Take 1 tablet by mouth every 6 (six) hours as needed for moderate pain. 01/26/16   Renato Shin, MD  insulin lispro (HUMALOG KWIKPEN) 100 UNIT/ML KiwkPen 3 times a day (just before each meal) 20-15-25 units 01/05/16   Renato Shin, MD  Insulin Pen Needle (B-D ULTRAFINE III SHORT PEN) 31G X 8 MM MISC 1 each by Other route 4 (four) times daily. 09/14/12   Renato Shin, MD  leuprolide (LUPRON) 11.25 MG injection Inject 11.25 mg into the muscle every 6 (six) months.     Historical Provider, MD  levothyroxine (SYNTHROID, LEVOTHROID) 175 MCG tablet TAKE 1 TABLET BY MOUTH EVERY DAY BEFORE BREAKFAST. 11/08/15   Renato Shin, MD  lisinopril-hydrochlorothiazide (PRINZIDE,ZESTORETIC) 20-12.5 MG tablet TAKE 1 TABLET BY MOUTH EVERY DAY 12/31/15   Renato Shin, MD  Multiple Vitamins-Minerals (CENTRUM SILVER) tablet Take 1 tablet by mouth daily.    Historical Provider, MD    Family History Family History  Problem Relation Age of Onset  . Cancer Neg Hx     No cancer in the patient's immediate family, except of course for the patient himself, as noted    Social History Social History  Substance Use Topics  . Smoking status: Former Research scientist (life sciences)  . Smokeless tobacco: Never Used  . Alcohol use No     Allergies   Review of patient's allergies indicates no known allergies.   Review of Systems Review of Systems  All other systems reviewed and are negative.    Physical Exam Updated Vital Signs BP 147/64   Pulse 82   Temp 98.6 F (37 C) (Oral)   Resp 18   Ht '5\' 9"'$  (1.753 m)   Wt 99.3 kg   SpO2 96%   BMI 32.34 kg/m   Physical Exam  Constitutional: He appears  well-developed and well-nourished. No distress.  HENT:  Head: Normocephalic and atraumatic.  Neck: Neck supple.  Pulmonary/Chest: Effort normal.  Abdominal: Soft. He exhibits no distension. There is tenderness in the right upper quadrant and epigastric area. There is no rebound and no guarding.  Musculoskeletal:  Spine nontender, no crepitus, or stepoffs.  No tenderness throughout the back.  No ecchymosis.   Lower extremities:  Strength 5/5, sensation intact, distal pulses intact.     Neurological: He  is alert.  Skin: He is not diaphoretic.  Nursing note and vitals reviewed.    ED Treatments / Results  Labs (all labs ordered are listed, but only abnormal results are displayed) Labs Reviewed  COMPREHENSIVE METABOLIC PANEL - Abnormal; Notable for the following:       Result Value   Chloride 95 (*)    Glucose, Bld 272 (*)    BUN 24 (*)    Creatinine, Ser 1.70 (*)    GFR calc non Af Amer 38 (*)    GFR calc Af Amer 44 (*)    All other components within normal limits  CBC - Abnormal; Notable for the following:    WBC 11.8 (*)    All other components within normal limits  LIPASE, BLOOD  URINALYSIS, ROUTINE W REFLEX MICROSCOPIC (NOT AT [facility])    EKG  EKG Interpretation None       Radiology US Abdomen Complete  Result Date: 01/31/2016 CLINICAL DATA:  Upper abdominal pain and tenderness. History of abdominal surgery, lung cancer. EXAM: ABDOMEN ULTRASOUND COMPLETE COMPARISON:  CT chest January 10, 2016 FINDINGS: Examination limited by bowel gas. Due to recent fall, patient was unable to lie flat. Gallbladder: Echogenic 15 mm gallstone with acoustic shadowing. 2-4 mm gallbladder walls. No pericholecystic fluid. No sonographic Murphy's sign elicited. Common bile duct: Diameter: 7 mm Liver: Echogenic, hepatopetal portal vein. IVC: Not sonographically identified. Pancreas: Not sonographically identified. Spleen: Size and appearance within normal limits. Right Kidney: Length: 10.3 cm.  Echogenicity within normal limits. No mass or hydronephrosis visualized. Left Kidney: Length: 11 cm. Echogenicity within normal limits. No mass or hydronephrosis visualized. Abdominal aorta: Not sonographically identified. Other findings: None. IMPRESSION: Limit assessment due to bowel gas and patient habitus. Cholelithiasis without sonographic findings of acute cholecystitis. Electronically Signed   By: Elon Alas M.D.   On: 01/31/2016 18:33    Procedures Procedures (including critical care time)  Medications Ordered in ED Medications  diatrizoate meglumine-sodium (GASTROGRAFIN) 66-10 % solution (not administered)  fentaNYL (SUBLIMAZE) injection 50 mcg (50 mcg Intravenous Given 01/31/16 1905)  ondansetron (ZOFRAN) injection 4 mg (4 mg Intravenous Given 01/31/16 1923)     Initial Impression / Assessment and Plan / ED Course  I have reviewed the triage vital signs and the nursing notes.  Pertinent labs & imaging results that were available during my care of the patient were reviewed by me and considered in my medical decision making (see chart for details).  Clinical Course    Afebrile nontoxic patient with low back pain that began last week after mechanical fall out of a truck.  No red flags for back pain.   Also developed sharp shooting abdominal pains and nausea.  Recent CT chest demonstrated stone in gallbladder neck, pt not symptoms until this point.  US abdomen shows cholelithiasis without evidence of cholecystitis.  Labs significant for mild luekocytosis, hyperglycemia, renal insufficiency.  Pt also seen and examined by Dr Laverta Baltimore.   Plan is for CT abdomen pelvis to further evaluate abdominal pain and back pain.  CT is pending at change of shift.  Dr Laverta Baltimore assumes care of patient at change of shift.    Final Clinical Impressions(s) / ED Diagnoses   Final diagnoses:  Bilateral low back pain without sciatica  Generalized abdominal pain    New Prescriptions New Prescriptions   No  medications on file     Clayton Bibles, PA-C 01/31/16 [place], MD 02/01/16 [phone]

## 2016-01-31 NOTE — ED Notes (Signed)
edp at bedside  

## 2016-01-31 NOTE — ED Notes (Signed)
Pt returned from CT °

## 2016-01-31 NOTE — Discharge Instructions (Signed)
You were seen in the ED today with back pain. We found some evidence of a spine fracture (T9). You will need to have a neurosurgeon follow up with you in the next week. Dr. Christella Noa can see you this week or you can call Dr. Ronnald Ramp. I have prescribed stronger pain medication and nausea medication. Take as directed. I have also prescribed a laxative which you may want to take with the pain medication as a frequent side effect is constipation.   Return to the ED with any new or worsening pain, numbness/weakness in the legs, or difficulty controlling your bowel or bladder function as this could be a sign of spine injury.

## 2016-02-02 ENCOUNTER — Other Ambulatory Visit: Payer: Self-pay | Admitting: Neurological Surgery

## 2016-02-02 DIAGNOSIS — Z6829 Body mass index (BMI) 29.0-29.9, adult: Secondary | ICD-10-CM | POA: Diagnosis not present

## 2016-02-02 DIAGNOSIS — S32009A Unspecified fracture of unspecified lumbar vertebra, initial encounter for closed fracture: Secondary | ICD-10-CM | POA: Diagnosis not present

## 2016-02-02 DIAGNOSIS — S22078A Other fracture of T9-T10 vertebra, initial encounter for closed fracture: Secondary | ICD-10-CM | POA: Diagnosis not present

## 2016-02-07 ENCOUNTER — Encounter (HOSPITAL_COMMUNITY)
Admission: RE | Admit: 2016-02-07 | Discharge: 2016-02-07 | Disposition: A | Discharge status: Home / Self Care | Payer: Medicare Other | Source: Ambulatory Visit | Attending: Neurological Surgery | Admitting: Neurological Surgery

## 2016-02-07 ENCOUNTER — Encounter (HOSPITAL_COMMUNITY): Payer: Self-pay

## 2016-02-07 ENCOUNTER — Other Ambulatory Visit: Payer: Self-pay | Admitting: Neurological Surgery

## 2016-02-07 DIAGNOSIS — Z85118 Personal history of other malignant neoplasm of bronchus and lung: Secondary | ICD-10-CM | POA: Diagnosis not present

## 2016-02-07 DIAGNOSIS — Z794 Long term (current) use of insulin: Secondary | ICD-10-CM | POA: Diagnosis not present

## 2016-02-07 DIAGNOSIS — K5090 Crohn's disease, unspecified, without complications: Secondary | ICD-10-CM | POA: Diagnosis not present

## 2016-02-07 DIAGNOSIS — C61 Malignant neoplasm of prostate: Secondary | ICD-10-CM | POA: Diagnosis not present

## 2016-02-07 DIAGNOSIS — Z85850 Personal history of malignant neoplasm of thyroid: Secondary | ICD-10-CM | POA: Diagnosis not present

## 2016-02-07 DIAGNOSIS — E1140 Type 2 diabetes mellitus with diabetic neuropathy, unspecified: Secondary | ICD-10-CM | POA: Diagnosis not present

## 2016-02-07 DIAGNOSIS — S22079A Unspecified fracture of T9-T10 vertebra, initial encounter for closed fracture: Secondary | ICD-10-CM | POA: Diagnosis not present

## 2016-02-07 DIAGNOSIS — M810 Age-related osteoporosis without current pathological fracture: Secondary | ICD-10-CM | POA: Diagnosis not present

## 2016-02-07 DIAGNOSIS — E1122 Type 2 diabetes mellitus with diabetic chronic kidney disease: Secondary | ICD-10-CM | POA: Diagnosis not present

## 2016-02-07 DIAGNOSIS — Z87891 Personal history of nicotine dependence: Secondary | ICD-10-CM | POA: Diagnosis not present

## 2016-02-07 DIAGNOSIS — Z79899 Other long term (current) drug therapy: Secondary | ICD-10-CM | POA: Diagnosis not present

## 2016-02-07 DIAGNOSIS — Z85828 Personal history of other malignant neoplasm of skin: Secondary | ICD-10-CM | POA: Diagnosis not present

## 2016-02-07 LAB — CBC WITH DIFFERENTIAL/PLATELET
Basophils Absolute: 0 10*3/uL (ref 0.0–0.1)
Basophils Relative: 0 %
Eosinophils Absolute: 0.1 10*3/uL (ref 0.0–0.7)
Eosinophils Relative: 1 %
HCT: 46.3 % (ref 39.0–52.0)
Hemoglobin: 15.2 g/dL (ref 13.0–17.0)
Lymphocytes Relative: 11 %
Lymphs Abs: 1.6 10*3/uL (ref 0.7–4.0)
MCH: 31.5 pg (ref 26.0–34.0)
MCHC: 32.8 g/dL (ref 30.0–36.0)
MCV: 96.1 fL (ref 78.0–100.0)
Monocytes Absolute: 1 10*3/uL (ref 0.1–1.0)
Monocytes Relative: 7 %
Neutro Abs: 12 10*3/uL — ABNORMAL HIGH (ref 1.7–7.7)
Neutrophils Relative %: 81 %
Platelets: 342 10*3/uL (ref 150–400)
RBC: 4.82 MIL/uL (ref 4.22–5.81)
RDW: 12.2 % (ref 11.5–15.5)
WBC: 14.7 10*3/uL — ABNORMAL HIGH (ref 4.0–10.5)

## 2016-02-07 LAB — PROTIME-INR
INR: 1.01
Prothrombin Time: 13.3 seconds (ref 11.4–15.2)

## 2016-02-07 LAB — BASIC METABOLIC PANEL
Anion gap: 13 (ref 5–15)
BUN: 17 mg/dL (ref 6–20)
CO2: 32 mmol/L (ref 22–32)
Calcium: 9.9 mg/dL (ref 8.9–10.3)
Chloride: 89 mmol/L — ABNORMAL LOW (ref 101–111)
Creatinine, Ser: 1.77 mg/dL — ABNORMAL HIGH (ref 0.61–1.24)
GFR calc Af Amer: 42 mL/min — ABNORMAL LOW (ref >60)
GFR calc non Af Amer: 36 mL/min — ABNORMAL LOW (ref >60)
Glucose, Bld: 248 mg/dL — ABNORMAL HIGH (ref 65–99)
Potassium: 4.1 mmol/L (ref 3.5–5.1)
Sodium: 134 mmol/L — ABNORMAL LOW (ref 135–145)

## 2016-02-07 LAB — TYPE AND SCREEN
ABO/RH(D): O POS
Antibody Screen: NEGATIVE

## 2016-02-07 LAB — SURGICAL PCR SCREEN
MRSA, PCR: NEGATIVE
Staphylococcus aureus: NEGATIVE

## 2016-02-07 LAB — GLUCOSE, CAPILLARY: Glucose-Capillary: 212 mg/dL — ABNORMAL HIGH (ref 65–99)

## 2016-02-07 NOTE — Progress Notes (Signed)
NOTIFIED VANESSA AT OFFICE OF ELEVATED WBCS 14.7.  VANESSA STATED SHE WILL BE SURE DR. Ronnald Ramp IS AWARE.

## 2016-02-07 NOTE — Progress Notes (Signed)
Devin [name]  ASKED TO REVIEW EKG DONE AT PAT TODAY.

## 2016-02-07 NOTE — Progress Notes (Signed)
Anesthesia Chart Review: Patient is a 75 year old male scheduled for posterior lateral fusion T7-T10, segmental fixation T7-T11, extension of hardware on 02/08/16 (2:10 PM) by Dr. Ronnald Ramp.  History includes former smoker, psoriasis, Crohn's disease, ankylosing spondylitis, IDDM with neuropathy and nephropathy (CKD stage III), gout, osteoporosis, thyroid cancer s/p RAI and thyroidectomy '99, hypothyroidism, hypocalcemia, obesity, prostate cancer s/p prostatectomy with recurrence '12 s/p Lupron, T10-L4 fusion 12/16/14, [place] lung cancer s/p left VATS/mini-thoracotomy LU lobectomy and mediastinal LN dissection 03/16/15.  PCP is listed as Dr. Renato Shin. HEM-ONC is Dr. Julien Nordmann. Urologist is Dr. Raynelle Bring. He was seen by cardiologist Dr. Wynonia Lawman for pre-operative evaluation (for LU lobectomy) on 03/10/15. He had seen him previously in 2004 when patient had an abnormal exercise stress test followed by a cardiac cath showing no significant disease. He did not recommend any additional cardiac testing at the 03/10/15 visit.  Meds include Asacol, atenolol, Buspar, Flexeril, Norco, Humalog, Lupron, levothyroxine, lisinopril-HCTZ, Zofran, Percocet.  BP (!) 133/59   Pulse 89   Temp 36.8 C (Oral)   Resp 18   Ht '5\' 9"'$  (1.753 m)   Wt 211 lb 4 oz (95.8 kg)   SpO2 99%   BMI 31.20 kg/m    02/07/16 EKG: NSR, right ventricular conduction delay. Low voltage QRS. Inferior infarct, age undetermined, new Q wave in aVF since 12/15/15 although this has been intermittently present on EKGs dating back to at least 2013.   03/17/03 Cardiac cath: IMPRESSION: 1. Very minimal coronary artery disease (10-20% LAD) with no significant obstructive stenoses noted.  2. Normal left ventricular function. LVEF 60%.  01/10/16 Chest CT: IMPRESSION: 1. Stable CT of the chest. No definite evidence for local tumor recurrence or metastatic disease status post left upper lobectomy.  06/14/15 CXR: IMPRESSION: Elevated right hemidiaphragm with  right basilar scarring. No active disease.  Preoperative labs noted. BUN 24, Cr 1.70 (known CKD stage III; Cr appears consistent with labs since 04/2015; Cr range primarily 1.4-1.7 since at least 2009 but with a few in the 1.1-1.3 range in 2016). H/H 14.8/44.3. Glucose 272. A1c was not done at PAT, so to be drawn on arrival. (A1c 6.2 on 09/05/15.)   History and multiple EKGs reviewed with anesthesiologist Dr. Kalman Shan. Further evaluation on the day of surgery. If remains asymptomatic from a CV standpoint then would anticipate that he can proceed as planned. He will need close monitoring of his renal function post-operatively.  [facility] Stay [facility]/Anesthesiology Phone [phone] 02/07/2016 4:08 PM

## 2016-02-07 NOTE — Pre-Procedure Instructions (Signed)
Devin [name]  02/07/2016      [facility], [place] [address] Phone: [phone] Fax: [phone]  [place], [facility] [address] [place] [address] [place] [address] Phone: [phone] Fax: [phone]  [facility] [place] - [place], [place] - [address] AT [address] [place] [address] Phone: [phone] Fax: [phone]    Your procedure is scheduled on  Thursday 02/08/16  Report to [place] at 1220 P.M.  Call this number if you have problems the morning of surgery:  [phone]   Remember:  Do not eat food or drink liquids after midnight.  Take these medicines the morning of surgery with A SIP OF WATER  ASACOL, ATENOLOL(TENORMIN), BUSPIRONE (BUSPAR), HYDROCODONE, LEVOTHYROXINE     (NO MULTIVITAMIN, OR ASPIRIN/ ASPIRIN PRODUCTS)    How to Manage Your Diabetes Before and After Surgery  Why is it important to control my blood sugar before and after surgery? . Improving blood sugar levels before and after surgery helps healing and can limit problems. . A way of improving blood sugar control is eating a healthy diet by: o  Eating less sugar and carbohydrates o  Increasing activity/exercise o  Talking with your doctor about reaching your blood sugar goals . High blood sugars (greater than 180 mg/dL) can raise your risk of infections and slow your recovery, so you will need to focus on controlling your diabetes during the weeks before surgery. . Make sure that the doctor who takes care of your diabetes knows about your planned surgery including the date and location.  How do I manage my blood sugar before surgery? . Check your blood sugar at least 4 times a day, starting 2 days before surgery, to make sure that the level is not too high or low. o Check your  blood sugar the morning of your surgery when you wake up and every 2 hours until you get to the Short Stay unit. . If your blood sugar is less than 70 mg/dL, you will need to treat for low blood sugar: o Do not take insulin. o Treat a low blood sugar (less than 70 mg/dL) with  cup of clear juice (cranberry or apple), 4 glucose tablets, OR glucose gel. o Recheck blood sugar in 15 minutes after treatment (to make sure it is greater than 70 mg/dL). If your blood sugar is not greater than 70 mg/dL on recheck, call [phone] for further instructions. . Report your blood sugar to the short stay nurse when you get to Short Stay.  . If you are admitted to the hospital after surgery: o Your blood sugar will be checked by the staff and you will probably be given insulin after surgery (instead of oral diabetes medicines) to make sure you have good blood sugar levels. o The goal for blood sugar control after surgery is 80-180 mg/dL.              WHAT DO I DO ABOUT MY DIABETES MEDICATION?   Marland Kitchen Do not take oral diabetes medicines (pills) the morning of surGERY       . HE MORNING OF SURGERY, take _____NONE________ units of __________insulin.  . The day of surgery, do not take other diabetes injectables, including Byetta (exenatide), Bydureon (exenatide ER), Victoza (liraglutide), or Trulicity (dulaglutide).  Marland Kitchen  If your CBG is greater than 220 mg/dL, you may take  of your sliding scale (correction) dose of insulin.  Other Instructions:          Patient Signature:  Date:   Nurse Signature:  Date:   Reviewed and Endorsed by [facility] Patient Education Committee, August 2015  Do not wear jewelry, make-up or nail polish.  Do not wear lotions, powders, or perfumes, or deoderant.  Do not shave 48 hours prior to surgery.  Men may shave face and neck.  Do not bring valuables to the hospital.  [facility] is not responsible for any belongings or valuables.  Contacts, dentures or  bridgework may not be worn into surgery.  Leave your suitcase in the car.  After surgery it may be brought to your room.  For patients admitted to the hospital, discharge time will be determined by your treatment team.  Patients discharged the day of surgery will not be allowed to drive home.   Name and phone number of your driver:    Special instructions:  SEE PREPARING FOR SURGERY  Please read over the following fact sheets that you were given. Pain Booklet, MRSA Information and Surgical Site Infection Prevention

## 2016-02-07 NOTE — Progress Notes (Signed)
Pt to arrive at 8:00 A.M. on DOS for STAT chest x ray and urinalysis ( per MD).

## 2016-02-08 ENCOUNTER — Inpatient Hospital Stay (HOSPITAL_COMMUNITY): Payer: Medicare Other

## 2016-02-08 ENCOUNTER — Inpatient Hospital Stay (HOSPITAL_COMMUNITY)
Admission: RE | Admit: 2016-02-08 | Discharge: 2016-02-10 | DRG: 460 | Disposition: A | Discharge status: Home / Self Care | Payer: Medicare Other | Source: Ambulatory Visit | Attending: Neurological Surgery | Admitting: Neurological Surgery

## 2016-02-08 ENCOUNTER — Encounter (HOSPITAL_COMMUNITY): Payer: Self-pay | Admitting: Neurological Surgery

## 2016-02-08 ENCOUNTER — Inpatient Hospital Stay (HOSPITAL_COMMUNITY): Payer: Medicare Other | Admitting: Certified Registered"

## 2016-02-08 ENCOUNTER — Inpatient Hospital Stay (HOSPITAL_COMMUNITY): Payer: Medicare Other | Admitting: Vascular Surgery

## 2016-02-08 ENCOUNTER — Encounter (HOSPITAL_COMMUNITY)
Admission: RE | Disposition: A | Discharge status: Home / Self Care | Payer: Self-pay | Source: Ambulatory Visit | Attending: Neurological Surgery

## 2016-02-08 DIAGNOSIS — C61 Malignant neoplasm of prostate: Secondary | ICD-10-CM | POA: Diagnosis present

## 2016-02-08 DIAGNOSIS — Z85828 Personal history of other malignant neoplasm of skin: Secondary | ICD-10-CM | POA: Diagnosis not present

## 2016-02-08 DIAGNOSIS — W19XXXA Unspecified fall, initial encounter: Secondary | ICD-10-CM | POA: Diagnosis present

## 2016-02-08 DIAGNOSIS — Z85118 Personal history of other malignant neoplasm of bronchus and lung: Secondary | ICD-10-CM

## 2016-02-08 DIAGNOSIS — Z79899 Other long term (current) drug therapy: Secondary | ICD-10-CM | POA: Diagnosis not present

## 2016-02-08 DIAGNOSIS — Z981 Arthrodesis status: Secondary | ICD-10-CM | POA: Diagnosis not present

## 2016-02-08 DIAGNOSIS — E1122 Type 2 diabetes mellitus with diabetic chronic kidney disease: Secondary | ICD-10-CM | POA: Diagnosis present

## 2016-02-08 DIAGNOSIS — E1140 Type 2 diabetes mellitus with diabetic neuropathy, unspecified: Secondary | ICD-10-CM | POA: Diagnosis present

## 2016-02-08 DIAGNOSIS — S22071A Stable burst fracture of T9-T10 vertebra, initial encounter for closed fracture: Secondary | ICD-10-CM | POA: Diagnosis not present

## 2016-02-08 DIAGNOSIS — D72829 Elevated white blood cell count, unspecified: Secondary | ICD-10-CM | POA: Diagnosis not present

## 2016-02-08 DIAGNOSIS — N183 Chronic kidney disease, stage 3 (moderate): Secondary | ICD-10-CM | POA: Diagnosis present

## 2016-02-08 DIAGNOSIS — M810 Age-related osteoporosis without current pathological fracture: Secondary | ICD-10-CM | POA: Diagnosis present

## 2016-02-08 DIAGNOSIS — S22079A Unspecified fracture of T9-T10 vertebra, initial encounter for closed fracture: Secondary | ICD-10-CM | POA: Diagnosis not present

## 2016-02-08 DIAGNOSIS — K5090 Crohn's disease, unspecified, without complications: Secondary | ICD-10-CM | POA: Diagnosis present

## 2016-02-08 DIAGNOSIS — Z794 Long term (current) use of insulin: Secondary | ICD-10-CM | POA: Diagnosis not present

## 2016-02-08 DIAGNOSIS — E890 Postprocedural hypothyroidism: Secondary | ICD-10-CM | POA: Diagnosis present

## 2016-02-08 DIAGNOSIS — Z87891 Personal history of nicotine dependence: Secondary | ICD-10-CM | POA: Diagnosis not present

## 2016-02-08 DIAGNOSIS — M545 Low back pain: Secondary | ICD-10-CM | POA: Diagnosis not present

## 2016-02-08 DIAGNOSIS — M4324 Fusion of spine, thoracic region: Secondary | ICD-10-CM | POA: Diagnosis not present

## 2016-02-08 DIAGNOSIS — E440 Moderate protein-calorie malnutrition: Secondary | ICD-10-CM | POA: Insufficient documentation

## 2016-02-08 DIAGNOSIS — Z419 Encounter for procedure for purposes other than remedying health state, unspecified: Secondary | ICD-10-CM

## 2016-02-08 DIAGNOSIS — S22000A Wedge compression fracture of unspecified thoracic vertebra, initial encounter for closed fracture: Secondary | ICD-10-CM | POA: Diagnosis present

## 2016-02-08 DIAGNOSIS — S22078A Other fracture of T9-T10 vertebra, initial encounter for closed fracture: Secondary | ICD-10-CM | POA: Diagnosis not present

## 2016-02-08 DIAGNOSIS — Z85850 Personal history of malignant neoplasm of thyroid: Secondary | ICD-10-CM

## 2016-02-08 HISTORY — PX: POSTERIOR FUSION THORACIC SPINE: SUR1045

## 2016-02-08 HISTORY — DX: Malignant neoplasm of thyroid gland: C73

## 2016-02-08 HISTORY — DX: Hypothyroidism, unspecified: E03.9

## 2016-02-08 HISTORY — DX: Type 2 diabetes mellitus without complications: E11.9

## 2016-02-08 LAB — URINALYSIS W MICROSCOPIC (NOT AT ARMC)
Bilirubin Urine: NEGATIVE
Glucose, UA: NEGATIVE mg/dL
Hgb urine dipstick: NEGATIVE
Ketones, ur: NEGATIVE mg/dL
Leukocytes, UA: NEGATIVE
Nitrite: NEGATIVE
Protein, ur: NEGATIVE mg/dL
Specific Gravity, Urine: 1.015 (ref 1.005–1.030)
pH: 5.5 (ref 5.0–8.0)

## 2016-02-08 LAB — GLUCOSE, CAPILLARY
Glucose-Capillary: 171 mg/dL — ABNORMAL HIGH (ref 65–99)
Glucose-Capillary: 215 mg/dL — ABNORMAL HIGH (ref 65–99)

## 2016-02-08 LAB — HEMOGLOBIN A1C
Hgb A1c MFr Bld: 6.6 % — ABNORMAL HIGH (ref 4.8–5.6)
Mean Plasma Glucose: 143 mg/dL

## 2016-02-08 SURGERY — POSTERIOR LUMBAR FUSION 2 LEVEL
Anesthesia: General | Site: Back

## 2016-02-08 MED ORDER — VANCOMYCIN HCL 1000 MG IV SOLR
INTRAVENOUS | Status: DC | PRN
  Administered 2016-02-08: 1000 mg via TOPICAL

## 2016-02-08 MED ORDER — OXYCODONE HCL 5 MG/5ML PO SOLN
5.0000 mg | Freq: Once | ORAL | Status: DC | PRN
Start: 2016-02-08 — End: 2016-02-08

## 2016-02-08 MED ORDER — CYCLOBENZAPRINE HCL 10 MG PO TABS
10.0000 mg | ORAL_TABLET | Freq: Three times a day (TID) | ORAL | Status: DC | PRN
  Administered 2016-02-09 – 2016-02-10 (×3): 10 mg via ORAL
  Filled 2016-02-08 (×4): qty 1

## 2016-02-08 MED ORDER — DEXAMETHASONE SODIUM PHOSPHATE 10 MG/ML IJ SOLN
10.0000 mg | INTRAMUSCULAR | Status: AC
  Administered 2016-02-08: 10 mg via INTRAVENOUS
  Filled 2016-02-08: qty 1

## 2016-02-08 MED ORDER — LIDOCAINE 2% (20 MG/ML) 5 ML SYRINGE
INTRAMUSCULAR | Status: AC
  Filled 2016-02-08: qty 5

## 2016-02-08 MED ORDER — ROCURONIUM BROMIDE 100 MG/10ML IV SOLN
INTRAVENOUS | Status: DC | PRN
  Administered 2016-02-08: 60 mg via INTRAVENOUS

## 2016-02-08 MED ORDER — PHENYLEPHRINE 40 MCG/ML (10ML) SYRINGE FOR IV PUSH (FOR BLOOD PRESSURE SUPPORT)
PREFILLED_SYRINGE | INTRAVENOUS | Status: AC
  Filled 2016-02-08: qty 10

## 2016-02-08 MED ORDER — PHENYLEPHRINE HCL 10 MG/ML IJ SOLN
INTRAMUSCULAR | Status: DC | PRN
  Administered 2016-02-08: 80 ug via INTRAVENOUS

## 2016-02-08 MED ORDER — ONDANSETRON HCL 4 MG/2ML IJ SOLN
INTRAMUSCULAR | Status: DC | PRN
  Administered 2016-02-08: 4 mg via INTRAVENOUS

## 2016-02-08 MED ORDER — OXYCODONE-ACETAMINOPHEN 5-325 MG PO TABS
1.0000 | ORAL_TABLET | ORAL | Status: DC | PRN
  Administered 2016-02-08: 1 via ORAL
  Administered 2016-02-09 – 2016-02-10 (×5): 2 via ORAL
  Administered 2016-02-10: 1 via ORAL
  Filled 2016-02-08 (×5): qty 2
  Filled 2016-02-08: qty 1
  Filled 2016-02-08: qty 2

## 2016-02-08 MED ORDER — LISINOPRIL-HYDROCHLOROTHIAZIDE 20-12.5 MG PO TABS: 1.0000 | ORAL_TABLET | Freq: Every day | ORAL | Status: DC

## 2016-02-08 MED ORDER — ACETAMINOPHEN 10 MG/ML IV SOLN
INTRAVENOUS | Status: AC
  Filled 2016-02-08: qty 100

## 2016-02-08 MED ORDER — PROPOFOL 10 MG/ML IV BOLUS
INTRAVENOUS | Status: DC | PRN
  Administered 2016-02-08: 120 mg via INTRAVENOUS
  Administered 2016-02-08: 20 mg via INTRAVENOUS

## 2016-02-08 MED ORDER — PROPOFOL 10 MG/ML IV BOLUS
INTRAVENOUS | Status: AC
  Filled 2016-02-08: qty 20

## 2016-02-08 MED ORDER — THROMBIN 5000 UNITS EX SOLR
CUTANEOUS | Status: DC | PRN
  Administered 2016-02-08: 5000 [IU] via TOPICAL

## 2016-02-08 MED ORDER — BUPIVACAINE HCL (PF) 0.25 % IJ SOLN
INTRAMUSCULAR | Status: DC | PRN
  Administered 2016-02-08: 10 mL

## 2016-02-08 MED ORDER — CHLORHEXIDINE GLUCONATE CLOTH 2 % EX PADS: 6.0000 | MEDICATED_PAD | Freq: Once | CUTANEOUS | Status: DC

## 2016-02-08 MED ORDER — HYDROMORPHONE HCL 1 MG/ML IJ SOLN
0.2500 mg | INTRAMUSCULAR | Status: DC | PRN
  Administered 2016-02-08 (×2): 0.5 mg via INTRAVENOUS

## 2016-02-08 MED ORDER — THROMBIN 20000 UNITS EX KIT
PACK | CUTANEOUS | Status: DC | PRN
  Administered 2016-02-08: 20000 [IU] via TOPICAL

## 2016-02-08 MED ORDER — FENTANYL CITRATE (PF) 100 MCG/2ML IJ SOLN
INTRAMUSCULAR | Status: DC | PRN
  Administered 2016-02-08 (×4): 50 ug via INTRAVENOUS

## 2016-02-08 MED ORDER — EPHEDRINE 5 MG/ML INJ
INTRAVENOUS | Status: AC
  Filled 2016-02-08: qty 10

## 2016-02-08 MED ORDER — CELECOXIB 200 MG PO CAPS
200.0000 mg | ORAL_CAPSULE | Freq: Two times a day (BID) | ORAL | Status: DC
  Administered 2016-02-08 – 2016-02-10 (×4): 200 mg via ORAL
  Filled 2016-02-08 (×4): qty 1

## 2016-02-08 MED ORDER — PROMETHAZINE HCL 25 MG/ML IJ SOLN: 6.2500 mg | INTRAMUSCULAR | Status: DC | PRN

## 2016-02-08 MED ORDER — POTASSIUM CHLORIDE IN NACL 20-0.9 MEQ/L-% IV SOLN
INTRAVENOUS | Status: DC
  Filled 2016-02-08: qty 1000

## 2016-02-08 MED ORDER — VANCOMYCIN HCL 1000 MG IV SOLR
INTRAVENOUS | Status: AC
  Filled 2016-02-08: qty 1000

## 2016-02-08 MED ORDER — HEMOSTATIC AGENTS (NO CHARGE) OPTIME
TOPICAL | Status: DC | PRN
  Administered 2016-02-08: 1 via TOPICAL

## 2016-02-08 MED ORDER — CEFAZOLIN IN D5W 1 GM/50ML IV SOLN
1.0000 g | Freq: Three times a day (TID) | INTRAVENOUS | Status: AC
  Administered 2016-02-08 – 2016-02-09 (×2): 1 g via INTRAVENOUS
  Filled 2016-02-08 (×3): qty 50

## 2016-02-08 MED ORDER — LISINOPRIL 20 MG PO TABS
20.0000 mg | ORAL_TABLET | Freq: Every day | ORAL | Status: DC
  Administered 2016-02-09: 20 mg via ORAL
  Filled 2016-02-08 (×4): qty 1

## 2016-02-08 MED ORDER — SUGAMMADEX SODIUM 200 MG/2ML IV SOLN
INTRAVENOUS | Status: DC | PRN
  Administered 2016-02-08: 150 mg via INTRAVENOUS

## 2016-02-08 MED ORDER — SODIUM CHLORIDE 0.9% FLUSH: 3.0000 mL | INTRAVENOUS | Status: DC | PRN

## 2016-02-08 MED ORDER — ENSURE ENLIVE PO LIQD
237.0000 mL | Freq: Two times a day (BID) | ORAL | Status: DC
  Administered 2016-02-09: 237 mL via ORAL
  Filled 2016-02-08 (×4): qty 237

## 2016-02-08 MED ORDER — CEFAZOLIN SODIUM-DEXTROSE 2-4 GM/100ML-% IV SOLN
2.0000 g | INTRAVENOUS | Status: AC
  Administered 2016-02-08: 2 g via INTRAVENOUS
  Filled 2016-02-08: qty 100

## 2016-02-08 MED ORDER — ACETAMINOPHEN 10 MG/ML IV SOLN
INTRAVENOUS | Status: DC | PRN
  Administered 2016-02-08: 1000 mg via INTRAVENOUS

## 2016-02-08 MED ORDER — 0.9 % SODIUM CHLORIDE (POUR BTL) OPTIME
TOPICAL | Status: DC | PRN
  Administered 2016-02-08: 1000 mL

## 2016-02-08 MED ORDER — FENTANYL CITRATE (PF) 100 MCG/2ML IJ SOLN
INTRAMUSCULAR | Status: AC
Start: 2016-02-08 — End: 2016-02-08
  Filled 2016-02-08: qty 4

## 2016-02-08 MED ORDER — BUSPIRONE HCL 10 MG PO TABS
15.0000 mg | ORAL_TABLET | Freq: Two times a day (BID) | ORAL | Status: DC
  Administered 2016-02-08 – 2016-02-10 (×4): 15 mg via ORAL
  Filled 2016-02-08 (×4): qty 2

## 2016-02-08 MED ORDER — ATENOLOL 25 MG PO TABS
12.5000 mg | ORAL_TABLET | Freq: Every day | ORAL | Status: DC
  Administered 2016-02-09 – 2016-02-10 (×2): 12.5 mg via ORAL
  Filled 2016-02-08 (×2): qty 1

## 2016-02-08 MED ORDER — PHENOL 1.4 % MT LIQD
1.0000 | OROMUCOSAL | Status: DC | PRN
Start: 2016-02-08 — End: 2016-02-10

## 2016-02-08 MED ORDER — HYDROCHLOROTHIAZIDE 12.5 MG PO CAPS
12.5000 mg | ORAL_CAPSULE | Freq: Every day | ORAL | Status: DC
  Administered 2016-02-09: 12.5 mg via ORAL
  Filled 2016-02-08 (×3): qty 1

## 2016-02-08 MED ORDER — SODIUM CHLORIDE 0.9 % IV SOLN: 250.0000 mL | INTRAVENOUS | Status: DC

## 2016-02-08 MED ORDER — ROCURONIUM BROMIDE 10 MG/ML (PF) SYRINGE
PREFILLED_SYRINGE | INTRAVENOUS | Status: AC
  Filled 2016-02-08: qty 10

## 2016-02-08 MED ORDER — OXYCODONE HCL 5 MG PO TABS: 5.0000 mg | ORAL_TABLET | Freq: Once | ORAL | Status: DC | PRN

## 2016-02-08 MED ORDER — LACTATED RINGERS IV SOLN
INTRAVENOUS | Status: DC
  Administered 2016-02-08: 50 mL/h via INTRAVENOUS
  Administered 2016-02-08: 15:00:00 via INTRAVENOUS

## 2016-02-08 MED ORDER — HYDROMORPHONE HCL 1 MG/ML IJ SOLN
INTRAMUSCULAR | Status: AC
  Filled 2016-02-08: qty 1

## 2016-02-08 MED ORDER — MORPHINE SULFATE (PF) 2 MG/ML IV SOLN
1.0000 mg | INTRAVENOUS | Status: DC | PRN
  Administered 2016-02-09: 2 mg via INTRAVENOUS
  Filled 2016-02-08: qty 1

## 2016-02-08 MED ORDER — MENTHOL 3 MG MT LOZG: 1.0000 | LOZENGE | OROMUCOSAL | Status: DC | PRN

## 2016-02-08 MED ORDER — SENNA 8.6 MG PO TABS
1.0000 | ORAL_TABLET | Freq: Two times a day (BID) | ORAL | Status: DC
  Administered 2016-02-09: 8.6 mg via ORAL
  Filled 2016-02-08 (×4): qty 1

## 2016-02-08 MED ORDER — LEVOTHYROXINE SODIUM 75 MCG PO TABS
175.0000 ug | ORAL_TABLET | Freq: Every day | ORAL | Status: DC
  Administered 2016-02-09 – 2016-02-10 (×2): 175 ug via ORAL
  Filled 2016-02-08 (×2): qty 1

## 2016-02-08 MED ORDER — ACETAMINOPHEN 650 MG RE SUPP: 650.0000 mg | RECTAL | Status: DC | PRN

## 2016-02-08 MED ORDER — ONDANSETRON HCL 4 MG/2ML IJ SOLN: 4.0000 mg | INTRAMUSCULAR | Status: DC | PRN

## 2016-02-08 MED ORDER — ACETAMINOPHEN 325 MG PO TABS: 650.0000 mg | ORAL_TABLET | ORAL | Status: DC | PRN

## 2016-02-08 MED ORDER — SODIUM CHLORIDE 0.9% FLUSH
3.0000 mL | Freq: Two times a day (BID) | INTRAVENOUS | Status: DC
  Administered 2016-02-09 – 2016-02-10 (×2): 3 mL via INTRAVENOUS

## 2016-02-08 MED ORDER — LIDOCAINE HCL (CARDIAC) 20 MG/ML IV SOLN
INTRAVENOUS | Status: DC | PRN
  Administered 2016-02-08: 100 mg via INTRAVENOUS

## 2016-02-08 MED ORDER — SODIUM CHLORIDE 0.9 % IR SOLN
Status: DC | PRN
  Administered 2016-02-08: 500 mL

## 2016-02-08 SURGICAL SUPPLY — 54 items
ADH SKN CLS APL DERMABOND .7 (GAUZE/BANDAGES/DRESSINGS) ×2
APL SKNCLS STERI-STRIP NONHPOA (GAUZE/BANDAGES/DRESSINGS) ×1
BAG DECANTER FOR FLEXI CONT (MISCELLANEOUS) ×2 IMPLANT
BENZOIN TINCTURE PRP APPL 2/3 (GAUZE/BANDAGES/DRESSINGS) ×2 IMPLANT
BLADE CLIPPER SURG (BLADE) IMPLANT
BONE CANC CHIPS 40CC CAN1/2 (Bone Implant) ×2 IMPLANT
BUR MATCHSTICK NEURO 3.0 LAGG (BURR) ×2 IMPLANT
CANISTER SUCT 3000ML PPV (MISCELLANEOUS) ×2 IMPLANT
CHIPS CANC BONE 40CC CAN1/2 (Bone Implant) ×1 IMPLANT
CONT SPEC 4OZ CLIKSEAL STRL BL (MISCELLANEOUS) ×2 IMPLANT
COVER BACK TABLE 60X90IN (DRAPES) ×2 IMPLANT
DERMABOND ADVANCED (GAUZE/BANDAGES/DRESSINGS) ×2
DERMABOND ADVANCED .7 DNX12 (GAUZE/BANDAGES/DRESSINGS) ×2 IMPLANT
DRAPE C-ARM 42X72 X-RAY (DRAPES) ×4 IMPLANT
DRAPE C-ARMOR (DRAPES) ×2 IMPLANT
DRAPE LAPAROTOMY 100X72X124 (DRAPES) ×2 IMPLANT
DRAPE POUCH INSTRU U-SHP 10X18 (DRAPES) ×2 IMPLANT
DRAPE SURG 17X23 STRL (DRAPES) ×2 IMPLANT
DRSG OPSITE POSTOP 4X10 (GAUZE/BANDAGES/DRESSINGS) ×2 IMPLANT
DURAPREP 26ML APPLICATOR (WOUND CARE) ×2 IMPLANT
ELECT REM PT RETURN 9FT ADLT (ELECTROSURGICAL) ×2
ELECTRODE REM PT RTRN 9FT ADLT (ELECTROSURGICAL) ×1 IMPLANT
EVACUATOR 1/8 PVC DRAIN (DRAIN) ×2 IMPLANT
GAUZE SPONGE 4X4 16PLY XRAY LF (GAUZE/BANDAGES/DRESSINGS) ×2 IMPLANT
GLOVE BIO SURGEON STRL SZ8 (GLOVE) ×4 IMPLANT
GOWN STRL REUS W/ TWL LRG LVL3 (GOWN DISPOSABLE) IMPLANT
GOWN STRL REUS W/ TWL XL LVL3 (GOWN DISPOSABLE) ×2 IMPLANT
GOWN STRL REUS W/TWL 2XL LVL3 (GOWN DISPOSABLE) IMPLANT
GOWN STRL REUS W/TWL LRG LVL3 (GOWN DISPOSABLE)
GOWN STRL REUS W/TWL XL LVL3 (GOWN DISPOSABLE) ×4
HEMOSTAT POWDER KIT SURGIFOAM (HEMOSTASIS) IMPLANT
KIT BASIN OR (CUSTOM PROCEDURE TRAY) ×2 IMPLANT
KIT ROOM TURNOVER OR (KITS) ×2 IMPLANT
NEEDLE HYPO 25X1 1.5 SAFETY (NEEDLE) ×2 IMPLANT
NS IRRIG 1000ML POUR BTL (IV SOLUTION) ×2 IMPLANT
PACK LAMINECTOMY NEURO (CUSTOM PROCEDURE TRAY) ×2 IMPLANT
PAD ARMBOARD 7.5X6 YLW CONV (MISCELLANEOUS) ×6 IMPLANT
RASP 3.0MM (RASP) ×2 IMPLANT
ROD RELINE-O 5.5X300 STRT NS (Rod) ×1 IMPLANT
ROD RELINE-O 5.5X300MM STRT (Rod) ×2 IMPLANT
SCREW LOCK RELINE 5.5 TULIP (Screw) ×20 IMPLANT
SCREW RELINE-O POLY 5.5X45MM (Screw) ×12 IMPLANT
SPONGE LAP 4X18 X RAY DECT (DISPOSABLE) IMPLANT
SPONGE SURGIFOAM ABS GEL 100 (HEMOSTASIS) ×2 IMPLANT
STRIP CLOSURE SKIN 1/2X4 (GAUZE/BANDAGES/DRESSINGS) ×8 IMPLANT
SUT VIC AB 0 CT1 18XCR BRD8 (SUTURE) ×3 IMPLANT
SUT VIC AB 0 CT1 8-18 (SUTURE) ×6
SUT VIC AB 2-0 CP2 18 (SUTURE) ×6 IMPLANT
SUT VIC AB 3-0 SH 8-18 (SUTURE) ×12 IMPLANT
TOWEL OR 17X24 6PK STRL BLUE (TOWEL DISPOSABLE) ×2 IMPLANT
TOWEL OR 17X26 10 PK STRL BLUE (TOWEL DISPOSABLE) ×2 IMPLANT
TRAP SPECIMEN MUCOUS 40CC (MISCELLANEOUS) ×2 IMPLANT
TRAY FOLEY W/METER SILVER 16FR (SET/KITS/TRAYS/PACK) IMPLANT
WATER STERILE IRR 1000ML POUR (IV SOLUTION) ×2 IMPLANT

## 2016-02-08 NOTE — H&P (Signed)
Subjective: Patient is a 75 y.o. male admitted for T9 fracture. Onset of symptoms was 2 weeks ago, unchanged since that time.  The pain is rated moderate, and is located at the across the lower back. No lower extremity symptoms. The pain is described as aching and occurs all day. The symptoms have been progressive. Symptoms are exacerbated by exercise. MRI or CT showed T9 fracture status post previous T10-L4 fusion for L1 fracture. This fracture was related to a fall.   Past Medical History:  Diagnosis Date  . Adenocarcinoma of left lung, stage 1 ([place]) 03/10/2015  . Ankylosing spondylitis ([place])    Diagnosed during lumbar fracture summer of 2016    . Crohn's disease ([place])   . Diabetic neuropathy ([place])   . Gout   . HIstory of basal cell cancer of face    THYROID CA HX  . History of thyroid cancer    Treated with RAI and total thyroidectomy 1999    . Hypocalcemia 04/13/2007  . Impotence   . 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 Obesity (BMI 30-39.9)   . Osteoporosis    Pt completed 5 years of fosamax in 2013     . Prostate cancer with recurrence    Treated with prostatectomy with recurrence 2012 with Lupron treatment now him   . Psoriasis   . VENTRAL HERNIA 04/14/2008    Past Surgical History:  Procedure Laterality Date  . CARDIAC CATHETERIZATION  03/17/2003  . HERNIA REPAIR  2009   ventral hernia  . LAPAROTOMY     for small bowel obstruction  . PROSTATECTOMY    . Stress Cardiolite  02/17/2003  . THOROCOTOMY WITH LOBECTOMY  03/16/2015   Procedure: THOROCOTOMY WITH LOBECTOMY;  Surgeon: Ivin Poot, MD;  Location: [facility];  Service: Thoracic;;  . TONSILLECTOMY    . TOTAL THYROIDECTOMY  1997  . Venous Doppler  05/30/2004  . VIDEO ASSISTED THORACOSCOPY Left 03/16/2015   Procedure: VIDEO ASSISTED THORACOSCOPY;  Surgeon: Ivin Poot, MD;  Location: [facility];  Service: Thoracic;  Laterality: Left;    Prior to Admission medications   Medication Sig Start Date  End Date Taking? Authorizing Provider  ASACOL HD 800 MG TBEC Take 1,600 mg by mouth 2 (two) times daily. 11/14/14  Yes Historical Provider, MD  atenolol (TENORMIN) 25 MG tablet TAKE 1/2 TABLET BY MOUTH DAILY 12/31/15  Yes Renato Shin, MD  busPIRone (BUSPAR) 15 MG tablet Take 1 tablet (15 mg total) by mouth 2 (two) times daily. 09/28/15  Yes Renato Shin, MD  calcium carbonate (OS-CAL) 1250 (500 CA) MG chewable tablet Chew 1 tablet by mouth 2 (two) times daily.   Yes Historical Provider, MD  cyclobenzaprine (FLEXERIL) 10 MG tablet Take 1 tablet (10 mg total) by mouth 3 (three) times daily as needed for muscle spasms. 01/26/16  Yes Renato Shin, MD  HYDROcodone-acetaminophen (NORCO/VICODIN) 5-325 MG tablet Take 1 tablet by mouth every 6 (six) hours as needed for moderate pain. 01/26/16  Yes Renato Shin, MD  insulin lispro (HUMALOG KWIKPEN) 100 UNIT/ML KiwkPen 3 times a day (just before each meal) 20-15-25 units Patient taking differently: Inject 15-25 Units into the skin 3 (three) times daily. 20 units every morning  15 units at lunch  25 units ad bedtime 01/05/16  Yes Renato Shin, MD  levothyroxine (SYNTHROID, LEVOTHROID) 175 MCG tablet TAKE 1 TABLET BY MOUTH EVERY DAY BEFORE BREAKFAST. 11/08/15  Yes Renato Shin, MD  lisinopril-hydrochlorothiazide (PRINZIDE,ZESTORETIC) 20-12.5 MG tablet TAKE 1  TABLET BY MOUTH EVERY DAY 12/31/15  Yes Renato Shin, MD  Multiple Vitamins-Minerals (CENTRUM SILVER) tablet Take 1 tablet by mouth daily.   Yes Historical Provider, MD  ondansetron (ZOFRAN) 4 MG tablet Take 1 tablet (4 mg total) by mouth every 8 (eight) hours as needed for nausea or vomiting. 01/31/16  Yes Margette Fast, MD  oxyCODONE-acetaminophen (PERCOCET/ROXICET) 5-325 MG tablet Take 2 tablets by mouth every 4 (four) hours as needed for severe pain. 01/31/16  Yes Margette Fast, MD  B-D ULTRAFINE III SHORT PEN 31G X 8 MM MISC USE FOUR TIMES DAILY AS DIRECTED 12/01/15   Renato Shin, MD  BAYER CONTOUR TEST test  strip TEST BLOOD SUGAR TWICE DAILY AS DIRECTED. 05/08/15   Renato Shin, MD  Glucose Blood (ASCENSIA CONTOUR TEST VI) Use as directed to test two times a day     Historical Provider, MD  Insulin Pen Needle (B-D ULTRAFINE III SHORT PEN) 31G X 8 MM MISC 1 each by Other route 4 (four) times daily. 09/14/12   Renato Shin, MD  leuprolide (LUPRON) 11.25 MG injection Inject 11.25 mg into the muscle every 6 (six) months.     Historical Provider, MD  senna-docusate (SENOKOT-S) 8.6-50 MG tablet Take 1 tablet by mouth daily. Patient not taking: Reported on 02/07/2016 01/31/16   Margette Fast, MD   No Known Allergies  Social History  Substance Use Topics  . Smoking status: Former Research scientist (life sciences)  . Smokeless tobacco: Never Used  . Alcohol use No    Family History  Problem Relation Age of Onset  . Cancer Neg Hx     No cancer in the patient's immediate family, except of course for the patient himself, as noted     Review of Systems  Positive ROS: neg  All other systems have been reviewed and were otherwise negative with the exception of those mentioned in the HPI and as above.  Objective: Vital signs in last 24 hours: Temp:  [98.7 F (37.1 C)] 98.7 F (37.1 C) (08/31 0844) Pulse Rate:  [76] 76 (08/31 0844) Resp:  [18] 18 (08/31 0844) BP: (137)/(58) 137/58 (08/31 0844) SpO2:  [98 %] 98 % (08/31 0844) Weight:  [95.8 kg (211 lb 4 oz)] 95.8 kg (211 lb 4 oz) (08/31 0844)  General Appearance: Alert, cooperative, no distress, appears stated age Head: Normocephalic, without obvious abnormality, atraumatic Eyes: PERRL, conjunctiva/corneas clear, EOM's intact    Neck: Supple, symmetrical, trachea midline Back: Symmetric, no curvature, ROM normal, no CVA tenderness Lungs:  respirations unlabored Heart: Regular rate and rhythm Abdomen: Soft, non-tender Extremities: Extremities normal, atraumatic, no cyanosis or edema Pulses: 2+ and symmetric all extremities Skin: Skin color, texture, turgor normal, no  rashes or lesions  NEUROLOGIC:   Mental status: Alert and oriented x4,  no aphasia, good attention span, fund of knowledge, and memory Motor Exam - grossly normal Sensory Exam - grossly normal Reflexes: 1+ Coordination - grossly normal Gait - grossly normal Balance - grossly normal Cranial Nerves: I: smell Not tested  II: visual acuity  OS: nl    OD: nl  II: visual fields Full to confrontation  II: pupils Equal, round, reactive to light  III,VII: ptosis None  III,IV,VI: extraocular muscles  Full ROM  V: mastication Normal  V: facial light touch sensation  Normal  V,VII: corneal reflex  Present  VII: facial muscle function - upper  Normal  VII: facial muscle function - lower Normal  VIII: hearing Not tested  IX: soft palate  elevation  Normal  IX,X: gag reflex Present  XI: trapezius strength  5/5  XI: sternocleidomastoid strength 5/5  XI: neck flexion strength  5/5  XII: tongue strength  Normal    Data Review Lab Results  Component Value Date   WBC 14.7 (H) 02/07/2016   HGB 15.2 02/07/2016   HCT 46.3 02/07/2016   MCV 96.1 02/07/2016   PLT 342 02/07/2016   Lab Results  Component Value Date   NA 134 (L) 02/07/2016   K 4.1 02/07/2016   CL 89 (L) 02/07/2016   CO2 32 02/07/2016   BUN 17 02/07/2016   CREATININE 1.77 (H) 02/07/2016   GLUCOSE 248 (H) 02/07/2016   Lab Results  Component Value Date   INR 1.01 02/07/2016    Assessment/Plan: Patient admitted for T7-T11 fusion with pedicle screws. Patient has failed a reasonable attempt at conservative therapy and has a probable unstable T9 fracture given his spinal pathology.  I explained the condition and procedure to the patient and answered any questions.  Patient wishes to proceed with procedure as planned. Understands risks/ benefits and typical outcomes of procedure.   Kasee Hantz S 02/08/2016 1:13 PM

## 2016-02-08 NOTE — Transfer of Care (Signed)
Immediate Anesthesia Transfer of Care Note  Patient: Devin [name]  Procedure(s) Performed: Procedure(s): Posterior Lateral Fusion T 7 - T10, segmantal fixation T7-T11, Extension of hardware (N/A)  Patient Location: PACU  Anesthesia Type:General  Level of Consciousness: awake, alert  and patient cooperative  Airway & Oxygen Therapy: Patient Spontanous Breathing and Patient connected to nasal cannula oxygen  Post-op Assessment: Report given to RN and Post -op Vital signs reviewed and stable  Post vital signs: Reviewed and stable  Last Vitals:  Vitals:   02/08/16 0844  BP: (!) 137/58  Pulse: 76  Resp: 18  Temp: 37.1 C    Last Pain:  Vitals:   02/08/16 0844  TempSrc: Oral         Complications: No apparent anesthesia complications

## 2016-02-08 NOTE — Progress Notes (Signed)
Patient admitted from PACU. Patient alert and oriented x 4. Patient is oriented to room and made comfortable.

## 2016-02-08 NOTE — Op Note (Addendum)
02/08/2016  4:22 PM  PATIENT:  Devin [name]  75 y.o. male  PRE-OPERATIVE DIAGNOSIS:  T9 fracture adjacent to previous T10-L4 fusion  POST-OPERATIVE DIAGNOSIS:  Same  PROCEDURE:  1. Posterior thoracic arthrodesis T7-T11 utilizing morcellized allograft, 2. Posterior thoracic segmental fixation T7-T11 utilizing nuvasive pedicle screws  SURGEON:  Sherley Bounds, MD  ASSISTANTS: jenkins  ANESTHESIA:   General  EBL: 200 ml  Total I/O In: 1000 [I.V.:1000] Out: 450 [Urine:250; Blood:200]  BLOOD ADMINISTERED:none  DRAINS: none   SPECIMEN:  No Specimen  INDICATION FOR PROCEDURE: This patient has a history of ankylosing spondylitis and suffered an L1 fracture last year. He underwent a T10-L4 fusion. He fell 2 weeks ago and developed a T9 fracture above his fusion. I recommended extension of the fusion up to T7 with instrumentation. Patient understood the risks, benefits, and alternatives and potential outcomes and wished to proceed.  PROCEDURE DETAILS: The patient was taken to the operating room and after induction of adequate generalized endotracheal anesthesia, the patient was rolled into the prone position on the chest rolls and all pressure points were padded. The thoracic region was cleaned and then prepped with DuraPrep and draped in the usual sterile fashion. 5 cc of local anesthesia was injected and then a dorsal midline incision was made and carried down to the thoracic fascia. The fascia was opened and the paraspinous musculature was taken down in a subperiosteal fashion to expose T7-T9 as well as the previously placed hardware at T10 and T11. Intraoperative fluoroscopy confirmed my level. I started with placement of the T7-T8 and T9 pedicle screws bilaterally. AP and lateral fluoroscopy was used to identify the pedicle screw entry zones. The pedicle probe was used to probe the T7, T8, and T9 pedicles bilaterally. I then palpated with a ball probe. I then tapped with a 4.5 mm tap and  then palpated with a ball probe once again. I then placed 5.5 x 45 mm pedicle screws into the T7, T8 and T9 pedicles bilaterally and checked these with AP and lateral fluoroscopy. I then used a high-speed drill to cut the Devin inferior to the T11 screws. The locking caps were removed and the rods were removed. I then cut 150 mm rods and placed these into the multiaxial screw heads of the pedicle screws and locked these into place with the locking caps and the anti-torque device. I then checked my final construct with AP and lateral fluoroscopy. I irrigated with saline solution containing bacitracin. I then drilled the lamina of T7, T8, T9, T10 and T11 and placed morcellized allograft over these to perform arthrodesis from T7-T11. Achieved hemostasis with bipolar cautery and then closed the fascia with 0 Vicryl. I closed the subcutaneous tissues with 2-0 Vicryl and the subcuticular tissues with 3-0 Vicryl. The skin was then closed with benzoin and Steri-Strips. The drapes were removed, a sterile dressing was applied. The patient was awakened from general anesthesia and transferred to the recovery room in stable condition. At the end of the procedure all sponge, needle and instrument counts were correct.   PLAN OF CARE: Admit to inpatient   PATIENT DISPOSITION:  PACU - hemodynamically stable.   Delay start of Pharmacological VTE agent (>24hrs) due to surgical blood loss or risk of bleeding:  yes

## 2016-02-08 NOTE — Anesthesia Procedure Notes (Signed)
Procedure Name: Intubation Date/Time: 02/08/2016 2:04 PM Performed by: Myna Bright Pre-anesthesia Checklist: Patient identified, Emergency Drugs available, Suction available and Patient being monitored Patient Re-evaluated:Patient Re-evaluated prior to inductionOxygen Delivery Method: Circle system utilized Preoxygenation: Pre-oxygenation with 100% oxygen Intubation Type: IV induction Ventilation: Mask ventilation without difficulty Laryngoscope Size: Mac and 4 Grade View: Grade I Tube type: Oral Tube size: 7.5 mm Number of attempts: 1 Airway Equipment and Method: Stylet Placement Confirmation: ETT inserted through vocal cords under direct vision,  positive ETCO2 and breath sounds checked- equal and bilateral Secured at: 24 cm Tube secured with: Tape Dental Injury: Teeth and Oropharynx as per pre-operative assessment

## 2016-02-08 NOTE — Anesthesia Preprocedure Evaluation (Addendum)
Anesthesia Evaluation  Patient identified by MRN, date of birth, ID band Patient awake    Reviewed: Allergy & Precautions, NPO status , Patient's Chart, lab work & pertinent test results, reviewed documented beta blocker date and time   Airway Mallampati: II   Neck ROM: Full    Dental  (+) Teeth Intact, Dental Advisory Given   Pulmonary neg pulmonary ROS, former smoker,    breath sounds clear to auscultation       Cardiovascular hypertension, Pt. on medications  Rhythm:Regular  Had nonobstructive minor CAD in 2004 following cath, has seen cardiology regularly and last was 2016 with no further work up needed.. Did well for previous spine fusion surgery and for thoracotomy surgery in 2016.Marland Kitchen Continues to be on optimal medical therapy including BB without signs or symptoms of ischemia or heart failure   Neuro/Psych Anxiety Un stable back FX    GI/Hepatic   Endo/Other  diabetes, Poorly Controlled, Type 2, Insulin DependentHypothyroidism   Renal/GU      Musculoskeletal   Abdominal (+)  Abdomen: soft.    Peds  Hematology 11/35   Anesthesia Other Findings   Reproductive/Obstetrics                            Anesthesia Physical  Anesthesia Plan  ASA: III  Anesthesia Plan: General   Post-op Pain Management:    Induction: Intravenous  Airway Management Planned: Oral ETT  Additional Equipment:   Intra-op Plan:   Post-operative Plan: Extubation in OR  Informed Consent: I have reviewed the patients History and Physical, chart, labs and discussed the procedure including the risks, benefits and alternatives for the proposed anesthesia with the patient or authorized representative who has indicated his/her understanding and acceptance.     Plan Discussed with:   Anesthesia Plan Comments:         Anesthesia Quick Evaluation

## 2016-02-09 LAB — GLUCOSE, CAPILLARY
Glucose-Capillary: 222 mg/dL — ABNORMAL HIGH (ref 65–99)
Glucose-Capillary: 234 mg/dL — ABNORMAL HIGH (ref 65–99)
Glucose-Capillary: 266 mg/dL — ABNORMAL HIGH (ref 65–99)

## 2016-02-09 MED ORDER — GLUCERNA SHAKE PO LIQD
237.0000 mL | Freq: Three times a day (TID) | ORAL | Status: DC
  Administered 2016-02-09 (×2): 237 mL via ORAL
  Filled 2016-02-09 (×7): qty 237

## 2016-02-09 MED ORDER — INSULIN ASPART 100 UNIT/ML ~~LOC~~ SOLN
0.0000 [IU] | Freq: Every day | SUBCUTANEOUS | Status: DC
  Administered 2016-02-10: 3 [IU] via SUBCUTANEOUS

## 2016-02-09 MED ORDER — INSULIN ASPART 100 UNIT/ML ~~LOC~~ SOLN
0.0000 [IU] | Freq: Three times a day (TID) | SUBCUTANEOUS | Status: DC
  Administered 2016-02-09 (×2): 5 [IU] via SUBCUTANEOUS
  Administered 2016-02-10: 3 [IU] via SUBCUTANEOUS
  Administered 2016-02-10: 8 [IU] via SUBCUTANEOUS

## 2016-02-09 MED ORDER — ADULT MULTIVITAMIN W/MINERALS CH
1.0000 | ORAL_TABLET | Freq: Every day | ORAL | Status: DC
  Administered 2016-02-09 – 2016-02-10 (×2): 1 via ORAL
  Filled 2016-02-09 (×2): qty 1

## 2016-02-09 NOTE — Anesthesia Postprocedure Evaluation (Signed)
Anesthesia Post Note  Patient: CEDRICK PARTAIN  Procedure(s) Performed: Procedure(s) (LRB): Posterior Lateral Fusion T 7 - T10, segmantal fixation T7-T11, Extension of hardware (N/A)  Patient location during evaluation: PACU Anesthesia Type: General Level of consciousness: awake and alert Pain management: pain level controlled Vital Signs Assessment: post-procedure vital signs reviewed and stable Respiratory status: spontaneous breathing, nonlabored ventilation, respiratory function stable and patient connected to nasal cannula oxygen Cardiovascular status: blood pressure returned to baseline and stable Postop Assessment: no signs of nausea or vomiting Anesthetic complications: no    Last Vitals:  Vitals:   02/09/16 0525 02/09/16 0932  BP: 120/69 134/80  Pulse: 83 94  Resp: 18   Temp: 37 C 36.6 C    Last Pain:  Vitals:   02/09/16 1147  TempSrc:   PainSc: 0-No pain                 Zenaida Deed

## 2016-02-09 NOTE — Evaluation (Signed)
Physical Therapy Evaluation Patient Details Name: Devin [name] MRN: [id number] DOB: [birth year] Today's Date: 02/09/2016   History of Present Illness  75 y.o. male admitted for T9 fracture. Underwent Posterior thoracic arthrodesis T7-T11 utilizing morcellized allograft, 2. Posterior thoracic segmental fixation T7-T11 utilizing nuvasive pedicle screws  Clinical Impression  Pt admitted with/for T9 fracture, s/p fusion t7-T11.  Pt currently limited functionally due to the problems listed below.  (see problems list.)  Pt will benefit from PT to maximize function and safety to be able to get home safely with available assist.     Follow Up Recommendations No PT follow up    Equipment Recommendations  None recommended by PT    Recommendations for Other Services       Precautions / Restrictions Precautions Precautions: Back Precaution Booklet Issued: Yes (comment) Required Braces or Orthoses: Spinal Brace (no orders for brace but pt brought in from home) Spinal Brace: Thoracolumbosacral orthotic Restrictions Weight Bearing Restrictions: No      Mobility  Bed Mobility Overal bed mobility: Needs Assistance Bed Mobility: Sidelying to Sit   Sidelying to sit: Mod assist     Sit to sidelying: Mod assist General bed mobility comments: assist to roll and assist to transition up.  Transfers Overall transfer level: Needs assistance Equipment used: Rolling walker (2 wheeled) Transfers: Sit to/from Stand Sit to Stand: Min guard         General transfer comment: vc for safety and hand placement  Ambulation/Gait Ambulation/Gait assistance: Min guard Ambulation Distance (Feet): 140 Feet Assistive device: Rolling walker (2 wheeled) Gait Pattern/deviations: Step-through pattern Gait velocity: slower Gait velocity interpretation: Below normal speed for age/gender General Gait Details: mildly unsteady overall, cues for posture  Stairs            Wheelchair Mobility     Modified Rankin (Stroke Patients Only)       Balance Overall balance assessment: Needs assistance   Sitting balance-Leahy Scale: Good       Standing balance-Leahy Scale: Fair                               Pertinent Vitals/Pain Pain Assessment: 0-10 Pain Score: 6  Pain Location: back Pain Descriptors / Indicators: Aching;Discomfort;Grimacing;Guarding Pain Intervention(s): Limited activity within patient's tolerance;Patient requesting pain meds-RN notified;Ice applied;Repositioned    Home Living Family/patient expects to be discharged to:: Private residence Living Arrangements: Spouse/significant other Available Help at Discharge: Family;Available 24 hours/day Type of Home: House Home Access: Stairs to enter     Home Layout: One level Home Equipment: Environmental consultant - 2 wheels;Bedside commode;Shower seat - built in      Prior Function Level of Independence: Independent               Hand Dominance   Dominant Hand: Right    Extremity/Trunk Assessment   Upper Extremity Assessment: Defer to OT evaluation           Lower Extremity Assessment: Overall WFL for tasks assessed;Generalized weakness      Cervical / Trunk Assessment: Kyphotic  Communication   Communication: No difficulties  Cognition Arousal/Alertness: Awake/alert Behavior During Therapy: WFL for tasks assessed/performed Overall Cognitive Status: Within Functional Limits for tasks assessed                      General Comments General comments (skin integrity, edema, etc.): Pt instructed/reviewed in all education.  Hard copy given.    Exercises  Assessment/Plan    PT Assessment Patient needs continued PT services  PT Diagnosis Difficulty walking;Acute pain   PT Problem List Decreased strength;Decreased balance;Decreased activity tolerance;Decreased mobility;Decreased knowledge of use of DME;Pain  PT Treatment Interventions DME instruction;Gait training;Stair  training;Functional mobility training;Balance training;Patient/family education;Therapeutic activities   PT Goals (Current goals can be found in the Care Plan section) Acute Rehab PT Goals Patient Stated Goal: to not be in pain PT Goal Formulation: With patient Time For Goal Achievement: 02/16/16 Potential to Achieve Goals: Good    Frequency Min 5X/week   Barriers to discharge        Co-evaluation               End of Session   Activity Tolerance: Patient tolerated treatment well;Patient limited by pain Patient left: in chair;with call bell/phone within reach;with chair alarm set Nurse Communication: Mobility status         Time: 2992-4268 PT Time Calculation (min) (ACUTE ONLY): 29 min   Charges:   PT Evaluation $PT Eval Moderate Complexity: 1 Procedure PT Treatments $Gait Training: 8-22 mins   PT G Codes:        Racquel Arkin, Tessie Fass 02/09/2016, 4:12 PM 02/09/2016  Donnella Sham, PT [phone]  (pager)

## 2016-02-09 NOTE — Progress Notes (Signed)
Patient ID: Devin [name], male   DOB: Jun 01, 1941, 75 y.o.   MRN: [id number] Subjective: Patient reports some back soreness. His big complaint is left upper quadrant pain. No Diarrhea. History of Crohn'[name]. Denies numbness tingling or weakness and states he is walking okay and urinating okay  Objective: Vital signs in last 24 hours: Temp:  [97.5 F (36.4 C)-98.6 F (37 C)] 97.9 F (36.6 C) (09/01 0932) Pulse Rate:  [71-94] 94 (09/01 0932) Resp:  [10-18] 18 (09/01 0525) BP: (120-158)/(63-80) 134/80 (09/01 0932) SpO2:  [97 %-100 %] 100 % (09/01 0932) FiO2 (%):  [21 %] 21 % (09/01 0005)  Intake/Output from previous day: 08/31 0701 - 09/01 0700 In: 1550 [I.V.:1500; IV Piggyback:50] Out: 650 [Urine:450; Blood:200] Intake/Output this shift: Total I/O In: 600 [P.O.:600] Out: 350 [Urine:350]  Neurologic: Grossly normal, good strength and legs, dressing dry, abdomen soft and nondistended and nontender ,  Lab Results: Lab Results  Component Value Date   WBC 14.7 (H) 02/07/2016   HGB 15.2 02/07/2016   HCT 46.3 02/07/2016   MCV 96.1 02/07/2016   PLT 342 02/07/2016   Lab Results  Component Value Date   INR 1.01 02/07/2016   BMET Lab Results  Component Value Date   NA 134 (L) 02/07/2016   K 4.1 02/07/2016   CL 89 (L) 02/07/2016   CO2 32 02/07/2016   GLUCOSE 248 (H) 02/07/2016   BUN 17 02/07/2016   CREATININE 1.77 (H) 02/07/2016   CALCIUM 9.9 02/07/2016    Studies/Results: Dg Chest 2 View  Result Date: 02/08/2016 CLINICAL DATA:  Elevated white blood cell count. Preoperative evaluation for lumbar surgery EXAM: CHEST  2 VIEW COMPARISON:  Chest radiograph June 14, 2015; chest CT January 10, 2016 FINDINGS: There is stable elevation of the right hemidiaphragm. There is mild bibasilar scarring. There is no frank edema or consolidation. No new opacity evident. Heart size and pulmonary vascularity are within normal limits. No adenopathy. There is postoperative change in the lower  thoracic and visualized lumbar spine regions. There is also postop change in the midline upper thoracic region anteriorly. There is anterior wedging of several mid lower thoracic vertebral bodies with increased kyphosis, stable. There is lower thoracic levoscoliosis. IMPRESSION: Scarring in the lung bases. No edema or consolidation. No new opacity. Stable cardiac silhouette. Persistent elevation of the right hemidiaphragm. Electronically Signed   By: Lowella Grip III M.D.   On: 02/08/2016 09:11   Dg Thoracic Spine 2 View  Result Date: 02/08/2016 CLINICAL DATA:  Extension of fusion from T7-T11 EXAM: THORACIC SPINE 2 VIEWS COMPARISON:  Lumbar spine films of 01/26/2016 FINDINGS: Three C-arm spot films were returned. By history the extension of the fusion was from T7-T11. IMPRESSION: Extension of fusion from T7 and T11. No complicating features on C-arm spot films which were returned. Electronically Signed   By: Ivar Drape M.D.   On: 02/08/2016 17:12   Dg Thoracic Spine 2 View  Result Date: 02/08/2016 CLINICAL DATA:  Thoracic compression fracture, close. Preop evaluation EXAM: THORACIC SPINE 2 VIEWS COMPARISON:  CT chest 01/10/2016.  CT abdomen pelvis 01/31/2016 FINDINGS: Pedicle screw and posterior rod fusion T10 through L4. Moderate burst fracture of T9 above the fusion level. This involves the posterior vertebral body cortex on CT. There is further loss of height since the CT of 01/31/2016. Mild retropulsion of bone into the canal, not well evaluated by this modality. No other fracture. IMPRESSION: Moderate burst fracture of T9 with progression of loss of height since  01/31/2016 Pedicle screw and rod fusion T10 through L4. Electronically Signed   By: Franchot Gallo M.D.   On: 02/08/2016 13:05   Dg C-arm 1-60 Min  Result Date: 02/08/2016 CLINICAL DATA:  Fusion extension from T7-T11 EXAM: DG C-ARM 61-120 MIN COMPARISON:  Lumbar spine films from 01/26/2016 FINDINGS: C-arm fluoroscopy was provided  during extension of the fusion from T7-T11. Fluoroscopy time of 43 seconds was recorded. C-arm fluoroscopy provided. Electronically Signed   By: Ivar Drape M.D.   On: 02/08/2016 17:11    Assessment/Plan: I think his abdominal pain is related to his thoracic fracture. I think it will get better as he recovers from the fusion. He seems to be doing well from surgery. Continue to mobilize. Remove Foley.   LOS: 1 day    Devin [name] 02/09/2016, 11:10 AM

## 2016-02-09 NOTE — Care Management Note (Signed)
Case Management Note  Patient Details  Name: Devin [name] MRN: [id number] Date of Birth: 1941/03/14  Subjective/Objective:    Pt underwent: Posterior thoracic arthrodesis T7-T11 utilizing morcellized allograft, 2. Posterior thoracic segmental fixation T7-T11 utilizing nuvasive pedicle screws. He is from home with spouse.                Action/Plan: Awaiting PT/OT recommendations. CM following for discharge needs.   Expected Discharge Date:                  Expected Discharge Plan:     In-House Referral:     Discharge planning Services     Post Acute Care Choice:    Choice offered to:     DME Arranged:    DME Agency:     HH Arranged:    HH Agency:     Status of Service:  In process, will continue to follow  If discussed at Long Length of Stay Meetings, dates discussed:    Additional Comments:  Pollie Friar, RN 02/09/2016, 12:02 PM

## 2016-02-09 NOTE — Progress Notes (Signed)
Occupational Therapy Evaluation Patient Details Name: Devin [name] MRN: [id number] DOB: 14-Mar-1941 Today's Date: 02/09/2016    History of Present Illness 75 y.o. male admitted for T9 fracture. Underwent Posterior thoracic arthrodesis T7-T11 utilizing morcellized allograft, 2. Posterior thoracic segmental fixation T7-T11 utilizing nuvasive pedicle screws   Clinical Impression   PTA, pt independent with ADL and mobility. Pt making good progress, but limited by pain. Pt will have 24/7 S at D/C. Will follow acutely to complete education regarding back precautions and compensatory techniques for ADL and mobility for ADL with necessary DME and AE.    Follow Up Recommendations  Supervision/Assistance - 24 hour(initially);No OT follow up   Equipment Recommendations  None recommended by OT    Recommendations for Other Services       Precautions / Restrictions Precautions Precautions: Back Precaution Booklet Issued: Yes (comment) Required Braces or Orthoses: Spinal Brace (no orders for brace but pt brought in from home) Spinal Brace: Thoracolumbosacral orthotic Restrictions Weight Bearing Restrictions: No      Mobility Bed Mobility Overal bed mobility: (P) Needs Assistance Bed Mobility: (P) Sit to Sidelying         Sit to sidelying: (P) Mod assist    Transfers Overall transfer level: (P) Needs assistance Equipment used: (P) Rolling walker (2 wheeled) Transfers: (P) Sit to/from Stand Sit to Stand: (P) Min guard         General transfer comment: (P) vc for safety and hand placement    Balance Overall balance assessment: (P) Needs assistance      sitting - good Standing - fair  Pt fell off truck tailgate due to "rail giving away"                                    ADL Overall ADL's : Needs assistance/impaired     Grooming: Set up;Supervision/safety   Upper Body Bathing: Set up;Supervision/ safety;Sitting   Lower Body Bathing: Minimal  assistance;Sit to/from stand   Upper Body Dressing : Minimal assistance;Sitting Upper Body Dressing Details (indicate cue type and reason): min A donning/brace Lower Body Dressing: Minimal assistance   Toilet Transfer: Min guard;RW;Ambulation   Toileting- Clothing Manipulation and Hygiene: Min guard;Sit to/from stand       Functional mobility during ADLs: Min guard;Rolling walker;Cueing for safety General ADL Comments: Pt staes he is familiar with back precautions     Vision  wears glasses   Perception     Praxis      Pertinent Vitals/Pain Pain Assessment: 0-10 Pain Score: 6  Pain Location: back Pain Descriptors / Indicators: Aching;Discomfort;Grimacing;Guarding Pain Intervention(s): Limited activity within patient's tolerance;Patient requesting pain meds-RN notified;Ice applied;Repositioned     Hand Dominance Right   Extremity/Trunk Assessment Upper Extremity Assessment Upper Extremity Assessment: Overall WFL for tasks assessed   Lower Extremity Assessment Lower Extremity Assessment: Defer to PT evaluation   Cervical / Trunk Assessment Cervical / Trunk Assessment: Kyphotic   Communication Communication Communication: No difficulties   Cognition Arousal/Alertness: Awake/alert Behavior During Therapy: WFL for tasks assessed/performed Overall Cognitive Status: Within Functional Limits for tasks assessed                     General Comments       Exercises       Shoulder Instructions      Home Living Family/patient expects to be discharged to:: Private residence Living Arrangements: Spouse/significant other Available Help at Discharge:  Family;Available 24 hours/day Type of Home: House Home Access: Stairs to enter     Home Layout: One level     Bathroom Shower/Tub: Occupational psychologist: Handicapped height Bathroom Accessibility: Yes How Accessible: Accessible via walker Home Equipment: [place] - 2 wheels;Bedside commode;Shower  seat - built in          Prior Functioning/Environment Level of Independence: Independent             OT Diagnosis: Generalized weakness;Acute pain   OT Problem List: Decreased strength;Decreased activity tolerance;Impaired balance (sitting and/or standing);Decreased knowledge of use of DME or AE;Decreased knowledge of precautions;Pain   OT Treatment/Interventions: Self-care/ADL training;DME and/or AE instruction;Therapeutic activities;Patient/family education    OT Goals(Current goals can be found in the care plan section) Acute Rehab OT Goals Patient Stated Goal: to not be in pain OT Goal Formulation: With patient/family Time For Goal Achievement: 02/23/16 Potential to Achieve Goals: Good ADL Goals Pt Will Transfer to Toilet: with modified independence;ambulating;bedside commode Pt Will Perform Tub/Shower Transfer: Shower transfer;with caregiver independent in assisting;with supervision;3 in 1;rolling walker;ambulating Additional ADL Goal #1: Pt will independently demonstrate 3/3 back precuations during ADL.mobility  for ADL  OT Frequency: Min 2X/week   Barriers to D/C:            Co-evaluation              End of Session Equipment Utilized During Treatment: Gait belt;Rolling walker;Back brace Nurse Communication: Mobility status;Patient requests pain meds;Precautions  Activity Tolerance: Patient tolerated treatment well Patient left: in bed;with call bell/phone within reach;with bed alarm set;with family/visitor present;with SCD's reapplied   Time: 0277-4128 OT Time Calculation (min): 25 min Charges:  OT General Charges $OT Visit: 1 Procedure OT Evaluation $OT Eval Moderate Complexity: 1 Procedure OT Treatments $Self Care/Home Management : 8-22 mins G-Codes:    Tierre Netto,HILLARY 03/08/16, 1:18 PM

## 2016-02-09 NOTE — Progress Notes (Signed)
Results for JAMELLE, GOLDSTON (MRN [id number]) as of 02/09/2016 11:33  Ref. Range 01/31/2016 21:18 02/07/2016 12:38 02/08/2016 08:49 02/08/2016 17:12 02/09/2016 11:09  Glucose-Capillary Latest Ref Range: 65 - 99 mg/dL 182 (H) 212 (H) 171 (H) 215 (H) 222 (H)  Noted that CBGs are greater than 180 mg/dl.  Patient takes Humalog 20 units at breakfast, 15 units at lunch, 25 units every HS at home. Recommend starting Novolog MODERATE correction scale TID & HS while in the hospital. Harvel Ricks RN BSN CDE

## 2016-02-09 NOTE — Progress Notes (Signed)
Initial Nutrition Assessment  DOCUMENTATION CODES:   Non-severe (moderate) malnutrition in context of acute illness/injury, Obesity unspecified  INTERVENTION:  Provide Glucerna Shake po TID, each supplement provides 220 kcal and 10 grams of protein Multivitamin with minerals daily   NUTRITION DIAGNOSIS:   Malnutrition related to acute illness (injury) as evidenced by percent weight loss.   GOAL:   Patient will meet greater than or equal to 90% of their needs   MONITOR:   PO intake, Supplement acceptance, Labs, Weight trends  REASON FOR ASSESSMENT:   Malnutrition Screening Tool    ASSESSMENT:   75 y.o. male with history of DM, Crohn's disease, adenocarcinoma of the lung, and gout admitted for T9 fracture. Underwent Posterior thoracic arthrodesis T7-T11 utilizing morcellized allograft, 2.   Pt reports eating poorly for the past 2 weeks (since his fall) due to pain and emesis from pain being bad. He reports eating about 50-75% less than usual during this time and losing 10 lbs. He has moderate muscle wasting per nutrition-focused physical exam. Weight history shows 5% weight loss in the past 3 weeks. Pt reports having ongoing pain today. He ate 50% of lunch. He is agreeable to receiving nutritional supplements. RD encouraged healthful PO intake and intake of nutritional supplements until PO intake at meals improves.   Labs: elevated glucose, low chloride  Diet Order:  Diet Carb Modified Fluid consistency: Thin; Room service appropriate? Yes  Skin:  Wound (see comment) (closed incisions on back)  Last BM:  unknown  Height:   Ht Readings from Last 1 Encounters:  02/08/16 '5\' 9"'$  (1.753 m)    Weight:   Wt Readings from Last 1 Encounters:  02/08/16 211 lb 4 oz (95.8 kg)    Ideal Body Weight:  72.7 kg  BMI:  Body mass index is 31.2 kg/m.  Estimated Nutritional Needs:   Kcal:  1900-2100  Protein:  95-115 grams  Fluid:  2.1 L/day  EDUCATION NEEDS:   No  education needs identified at this time  Scarlette Ar RD, LDN, CSP Inpatient Clinical Dietitian Pager: [phone] After Hours Pager: [phone]

## 2016-02-10 ENCOUNTER — Other Ambulatory Visit: Payer: Self-pay | Admitting: Endocrinology

## 2016-02-10 DIAGNOSIS — E440 Moderate protein-calorie malnutrition: Secondary | ICD-10-CM | POA: Insufficient documentation

## 2016-02-10 LAB — GLUCOSE, CAPILLARY
Glucose-Capillary: 172 mg/dL — ABNORMAL HIGH (ref 65–99)
Glucose-Capillary: 259 mg/dL — ABNORMAL HIGH (ref 65–99)
Glucose-Capillary: 284 mg/dL — ABNORMAL HIGH (ref 65–99)

## 2016-02-10 MED ORDER — OXYCODONE-ACETAMINOPHEN 5-325 MG PO TABS: 2.0000 | ORAL_TABLET | ORAL | 0 refills | Status: DC | PRN

## 2016-02-10 MED ORDER — CYCLOBENZAPRINE HCL 10 MG PO TABS: 10.0000 mg | ORAL_TABLET | Freq: Three times a day (TID) | ORAL | 2 refills | Status: DC | PRN

## 2016-02-10 NOTE — Progress Notes (Addendum)
CM received call from RN to arrange a hospital bed, 3n1 and a rolling walker.  CM notified [facility] [employer] rep, Reggie to please  arrange with the family delivery of the hospital bed.  No other CM needs were communicated.

## 2016-02-10 NOTE — Discharge Instructions (Signed)
Spinal Fusion, Care After Refer to this sheet in the next few weeks. These instructions provide you with information on caring for yourself after your procedure. Your caregiver may also give you more specific instructions. Your treatment has been planned according to current medical practices, but problems sometimes occur. Call your caregiver if you have any problems or questions after your procedure. HOME CARE INSTRUCTIONS   Take whatever pain medicine has been prescribed by your caregiver. Do not take over-the-counter pain medicine unless directed otherwise by your caregiver.  Do not drive if you are taking narcotic pain medicines.  Change your bandage (dressing) if necessary or as directed by your caregiver.  Do not get your surgical cut (incision) wet. After a few days you may take quick showers (rather than baths), but keep your incision clean and dry. Covering the incision with plastic wrap while you shower should keep your incision dry. A few weeks after surgery, once your incision has healed and your caregiver says it is okay, you can take baths or go swimming.  If you have been prescribed medicine to prevent your blood from clotting, follow the directions carefully.  Check the area around your incision often. Look for redness and swelling. Also, look for anything leaking from your wound. You can use a mirror or have a family member inspect your incision if it is in a place where it is difficult for you to see.  Ask your caregiver what activities you should avoid and for how long.  Walk as much as possible.  Do not lift anything heavier than 10 pounds (4.5 kilograms) until your caregiver says it is safe.  Do not twist or bend for a few weeks. Try not to pull on things. Avoid sitting for long periods of time. Change positions at least every hour.  Ask your caregiver what kinds of exercise you should do to make your back stronger and when you should begin doing these exercises. SEEK  IMMEDIATE MEDICAL CARE IF:   Pain suddenly becomes much worse.  The incision area is red, swollen, bleeding, or leaking fluid.  Your legs or feet become increasingly painful, numb, weak, or swollen.  You have trouble controlling urination or bowel movements.  You have trouble breathing.  You have chest pain.  You have a fever. MAKE SURE YOU:  Understand these instructions.  Will watch your condition.  Will get help right away if you are not doing well or get worse.   This information is not intended to replace advice given to you by your health care provider. Make sure you discuss any questions you have with your health care provider.   Document Released: 12/14/2004 Document Revised: 06/17/2014 Document Reviewed: 11/09/2014 Elsevier Interactive Patient Education [employer].

## 2016-02-10 NOTE — Discharge Summary (Signed)
Date of Admission: 02/08/2016  Date of Discharge: 02/10/16  PRE-OPERATIVE DIAGNOSIS:  T9 fracture adjacent to previous T10-L4 fusion  POST-OPERATIVE DIAGNOSIS:  Same  PROCEDURE:  1. Posterior thoracic arthrodesis T7-T11 utilizing morcellized allograft, 2. Posterior thoracic segmental fixation T7-T11 utilizing nuvasive pedicle screws  Attending: Eustace Moore, MD  Hospital Course:  The patient was admitted for the above listed operation and had an uncomplicated post-operative course.  They were discharged in stable condition.  Follow up: 3 weeks    Medication List    TAKE these medications   ASACOL HD 800 MG Tbec Generic drug:  Mesalamine Take 1,600 mg by mouth 2 (two) times daily.   ASCENSIA CONTOUR TEST VI Use as directed to test two times a day   BAYER CONTOUR TEST test strip Generic drug:  glucose blood TEST BLOOD SUGAR TWICE DAILY AS DIRECTED.   atenolol 25 MG tablet Commonly known as:  TENORMIN TAKE 1/2 TABLET BY MOUTH DAILY   busPIRone 15 MG tablet Commonly known as:  BUSPAR Take 1 tablet (15 mg total) by mouth 2 (two) times daily.   calcium carbonate 1250 (500 Ca) MG chewable tablet Commonly known as:  OS-CAL Chew 1 tablet by mouth 2 (two) times daily.   CENTRUM SILVER tablet Take 1 tablet by mouth daily.   cyclobenzaprine 10 MG tablet Commonly known as:  FLEXERIL Take 1 tablet (10 mg total) by mouth 3 (three) times daily as needed for muscle spasms.   HYDROcodone-acetaminophen 5-325 MG tablet Commonly known as:  NORCO/VICODIN Take 1 tablet by mouth every 6 (six) hours as needed for moderate pain.   insulin lispro 100 UNIT/ML KiwkPen Commonly known as:  HUMALOG KWIKPEN 3 times a day (just before each meal) 20-15-25 units What changed:  how much to take  how to take this  when to take this  additional instructions   Insulin Pen Needle 31G X 8 MM Misc Commonly known as:  B-D ULTRAFINE III SHORT PEN 1 each by Other route 4 (four) times  daily.   B-D ULTRAFINE III SHORT PEN 31G X 8 MM Misc Generic drug:  Insulin Pen Needle USE FOUR TIMES DAILY AS DIRECTED   leuprolide 11.25 MG injection Commonly known as:  LUPRON Inject 11.25 mg into the muscle every 6 (six) months.   levothyroxine 175 MCG tablet Commonly known as:  SYNTHROID, LEVOTHROID TAKE 1 TABLET BY MOUTH EVERY DAY BEFORE BREAKFAST.   lisinopril-hydrochlorothiazide 20-12.5 MG tablet Commonly known as:  PRINZIDE,ZESTORETIC TAKE 1 TABLET BY MOUTH EVERY DAY   ondansetron 4 MG tablet Commonly known as:  ZOFRAN Take 1 tablet (4 mg total) by mouth every 8 (eight) hours as needed for nausea or vomiting.   oxyCODONE-acetaminophen 5-325 MG tablet Commonly known as:  PERCOCET/ROXICET Take 2 tablets by mouth every 4 (four) hours as needed for severe pain.   senna-docusate 8.6-50 MG tablet Commonly known as:  Senokot-S Take 1 tablet by mouth daily.

## 2016-02-10 NOTE — Progress Notes (Signed)
Pt. D/c to home by car with family. Assessment stable, prescription given, all questions answered.

## 2016-02-10 NOTE — Progress Notes (Signed)
Physical Therapy Treatment Patient Details Name: Devin [name] MRN: [id number] DOB: August 24, 1940 Today's Date: 02/10/2016    History of Present Illness 75 y.o. male admitted for T9 fracture. Underwent Posterior thoracic arthrodesis T7-T11 utilizing morcellized allograft, 2. Posterior thoracic segmental fixation T7-T11 utilizing nuvasive pedicle screws    PT Comments    Patient is making good progress with PT.  From a mobility standpoint anticipate patient will be ready for DC home with family support. Pt and family deny any questions or concerns.      Follow Up Recommendations  No PT follow up;Supervision for mobility/OOB     Equipment Recommendations  None recommended by PT    Recommendations for Other Services       Precautions / Restrictions Precautions Precautions: Back Precaution Comments: reviewed back precautions Spinal Brace: Other (comment) Spinal Brace Comments: pt requesting to wear TLSO, not ordered.  Restrictions Weight Bearing Restrictions: No    Mobility  Bed Mobility               General bed mobility comments: in chair upon arrival  Transfers Overall transfer level: Needs assistance Equipment used: Rolling walker (2 wheeled) Transfers: Sit to/from Stand Sit to Stand: Min guard         General transfer comment: reminder for neutral spine  Ambulation/Gait Ambulation/Gait assistance: Min guard Ambulation Distance (Feet): 250 Feet Assistive device: Rolling walker (2 wheeled) Gait Pattern/deviations: Step-through pattern;Trunk flexed Gait velocity: decreased   General Gait Details: no instability, pt with tendency for flexed trunk, cues for posture as needed   Stairs Stairs: Yes Stairs assistance: Min guard Stair Management: One rail Right;Forwards;Alternating pattern Number of Stairs: 5 General stair comments: Pt reports feeling confident with doing stairs at home.   Wheelchair Mobility    Modified Rankin (Stroke Patients Only)       Balance Overall balance assessment: Needs assistance Sitting-balance support: No upper extremity supported Sitting balance-Leahy Scale: Good     Standing balance support: During functional activity Standing balance-Leahy Scale: Fair Standing balance comment: using rw                    Cognition Arousal/Alertness: Awake/alert Behavior During Therapy: WFL for tasks assessed/performed Overall Cognitive Status: Within Functional Limits for tasks assessed                      Exercises      General Comments        Pertinent Vitals/Pain Pain Assessment: 0-10 Pain Score: 7  Pain Location: abdomen Pain Descriptors / Indicators: Tightness Pain Intervention(s): Limited activity within patient's tolerance;Monitored during session    Home Living                      Prior Function            PT Goals (current goals can now be found in the care plan section) Acute Rehab PT Goals Patient Stated Goal: get rid of pain PT Goal Formulation: With patient Time For Goal Achievement: 02/16/16 Potential to Achieve Goals: Good Progress towards PT goals: Progressing toward goals    Frequency  Min 5X/week    PT Plan Current plan remains appropriate    Co-evaluation             End of Session Equipment Utilized During Treatment: Back brace;Gait belt (brace per pt request) Activity Tolerance: Patient tolerated treatment well Patient left: in chair;with call bell/phone within reach;with family/visitor present  Time: 9198-0221 PT Time Calculation (min) (ACUTE ONLY): 15 min  Charges:  $Gait Training: 8-22 mins                    G Codes:      Cassell Clement, PT, CSCS Pager [phone] Office [phone]  02/10/2016, 11:53 AM

## 2016-02-10 NOTE — Progress Notes (Signed)
No acute events Moving legs well Stable D/c

## 2016-02-20 DIAGNOSIS — S22078A Other fracture of T9-T10 vertebra, initial encounter for closed fracture: Secondary | ICD-10-CM | POA: Diagnosis not present

## 2016-02-28 ENCOUNTER — Other Ambulatory Visit: Payer: Self-pay | Admitting: Endocrinology

## 2016-03-05 ENCOUNTER — Encounter: Payer: Self-pay | Admitting: Endocrinology

## 2016-03-05 ENCOUNTER — Ambulatory Visit (INDEPENDENT_AMBULATORY_CARE_PROVIDER_SITE_OTHER): Payer: Medicare Other | Admitting: Endocrinology

## 2016-03-05 ENCOUNTER — Telehealth: Payer: Self-pay | Admitting: Endocrinology

## 2016-03-05 VITALS — BP 134/64 | HR 84 | Ht 69.0 in | Wt 205.0 lb

## 2016-03-05 DIAGNOSIS — R109 Unspecified abdominal pain: Secondary | ICD-10-CM | POA: Insufficient documentation

## 2016-03-05 DIAGNOSIS — R1084 Generalized abdominal pain: Secondary | ICD-10-CM | POA: Diagnosis not present

## 2016-03-05 DIAGNOSIS — Z23 Encounter for immunization: Secondary | ICD-10-CM

## 2016-03-05 LAB — CBC WITH DIFFERENTIAL/PLATELET
Basophils Absolute: 0 10*3/uL (ref 0.0–0.1)
Basophils Relative: 0.3 % (ref 0.0–3.0)
Eosinophils Absolute: 0.3 10*3/uL (ref 0.0–0.7)
Eosinophils Relative: 2.5 % (ref 0.0–5.0)
HCT: 40.5 % (ref 39.0–52.0)
Hemoglobin: 13.7 g/dL (ref 13.0–17.0)
Lymphocytes Relative: 14 % (ref 12.0–46.0)
Lymphs Abs: 1.7 10*3/uL (ref 0.7–4.0)
MCHC: 33.8 g/dL (ref 30.0–36.0)
MCV: 90.6 fl (ref 78.0–100.0)
Monocytes Absolute: 0.7 10*3/uL (ref 0.1–1.0)
Monocytes Relative: 5.4 % (ref 3.0–12.0)
Neutro Abs: 9.3 10*3/uL — ABNORMAL HIGH (ref 1.4–7.7)
Neutrophils Relative %: 77.8 % — ABNORMAL HIGH (ref 43.0–77.0)
Platelets: 311 10*3/uL (ref 150.0–400.0)
RBC: 4.47 Mil/uL (ref 4.22–5.81)
RDW: 12.4 % (ref 11.5–15.5)
WBC: 12 10*3/uL — ABNORMAL HIGH (ref 4.0–10.5)

## 2016-03-05 LAB — HEPATIC FUNCTION PANEL
ALT: 20 U/L (ref 0–53)
AST: 22 U/L (ref 0–37)
Albumin: 3.7 g/dL (ref 3.5–5.2)
Alkaline Phosphatase: 119 U/L — ABNORMAL HIGH (ref 39–117)
Bilirubin, Direct: 0 mg/dL (ref 0.0–0.3)
Total Bilirubin: 0.3 mg/dL (ref 0.2–1.2)
Total Protein: 7.1 g/dL (ref 6.0–8.3)

## 2016-03-05 LAB — URINALYSIS, ROUTINE W REFLEX MICROSCOPIC
Bilirubin Urine: NEGATIVE
Hgb urine dipstick: NEGATIVE
Ketones, ur: NEGATIVE
Leukocytes, UA: NEGATIVE
Nitrite: NEGATIVE
Specific Gravity, Urine: 1.02 (ref 1.000–1.030)
Total Protein, Urine: NEGATIVE
Urine Glucose: NEGATIVE
Urobilinogen, UA: 0.2 (ref 0.0–1.0)
pH: 5.5 (ref 5.0–8.0)

## 2016-03-05 LAB — AMYLASE: Amylase: 42 U/L (ref 27–131)

## 2016-03-05 MED ORDER — TRIAMCINOLONE ACETONIDE 0.1 % EX CREA: 1.0000 "application " | TOPICAL_CREAM | Freq: Two times a day (BID) | CUTANEOUS | 0 refills | Status: DC

## 2016-03-05 NOTE — Telephone Encounter (Signed)
Pt daughter Vinnie Level was calling in to talk to nurse, requests call back. [phone]

## 2016-03-05 NOTE — Patient Instructions (Addendum)
Your blood sugar is probably higher, due to less physical activity Please continue the same insulins for now.  However, take extra 2 units for any blood sugar in the high-100's, 4 extra if in the 200's, and 6 extra for any over 300. With time, you blood sugar will return to a good level.   blood tests are requested for you today.  We'll let you know about the results. Let's recheck the ultrasound.  you will receive a phone call, about a day and time for an appointment.  Please continue to work with your specialist on the back pain.   I have sent a prescription to your pharmacy, for the area on your right elbow.  Please alternate this twice a day, with non-prescription antibiotic ointment.  You should also keep it covered.

## 2016-03-05 NOTE — Progress Notes (Signed)
Subjective:    Patient ID: Devin [name], male    DOB: [birth year], 75 y.o.   MRN: [id number]  HPI Pt returns for f/u of diabetes mellitus: DM type: Insulin-requiring type 2.  Dx'ed: 1997.  Complications: polyneuropathy Therapy: insulin since 2002 DKA: never. Severe hypoglycemia: never. Pancreatitis: never Other: he takes multiple daily injections.  Interval history: no cbg record, but states cbg's are much higher over the past 3 weeks.  No steroids, but he attributes worsening to recent hosp visit.  He brings a record of his cbg's which I have reviewed today.  It varies from 66-299, but most are in the 100's.  There is no trend throughout the day.  He is on the same insulin.  Back pain is slowly improving.   He has intermittent generalized crampy-quality pain across the abdomen.  No assoc vomiting.   Wife is upset about when she received his narcotic refill, and make inappropriate remarks.   Past Medical History:  Diagnosis Date  . Adenocarcinoma of left lung, stage 1 ([place]) 03/10/2015  . Ankylosing spondylitis ([place])    Diagnosed during lumbar fracture summer of 2016    . Crohn's disease ([place])   . Gout   . HIstory of basal cell cancer of face    THYROID CA HX  . Hypocalcemia 04/13/2007  . Hypothyroidism   . Impotence   . 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 Obesity (BMI 30-39.9)   . Osteoporosis    Pt completed 5 years of fosamax in 2013     . Prostate cancer with recurrence    Treated with prostatectomy with recurrence 2012 with Lupron treatment now him   . Psoriasis   . Thyroid cancer ([place]) 1999   Treated with RAI and total thyroidectomy   . Type II diabetes mellitus ([place])     Past Surgical History:  Procedure Laterality Date  . ABDOMINAL EXPLORATION SURGERY     for small bowel obstruction  . BACK SURGERY    . BASAL CELL CARCINOMA EXCISION  "several"   "head"  . CARDIAC CATHETERIZATION  03/17/2003  . HERNIA REPAIR    . POSTERIOR  FUSION THORACIC SPINE  02/08/2016   1. Posterior thoracic arthrodesis T7-T11 utilizing morcellized allograft, 2. Posterior thoracic segmental fixation T7-T11 utilizing nuvasive pedicle screws  . PROSTATECTOMY    . Stress Cardiolite  02/17/2003  . THOROCOTOMY WITH LOBECTOMY  03/16/2015   Procedure: THOROCOTOMY WITH LOBECTOMY;  Surgeon: Ivin Poot, MD;  Location: [place];  Service: Thoracic;;  . TONSILLECTOMY    . TOTAL THYROIDECTOMY  1997  . Venous Doppler  05/30/2004  . VENTRAL HERNIA REPAIR  04/14/2008  . VIDEO ASSISTED THORACOSCOPY Left 03/16/2015   Procedure: VIDEO ASSISTED THORACOSCOPY;  Surgeon: Ivin Poot, MD;  Location: [facility] OR;  Service: Thoracic;  Laterality: Left;    Social History   Social History  . Marital status: Married    Spouse name: N/A  . Number of children: N/A  . Years of education: N/A   Occupational History  . Retired    Social History Main Topics  . Smoking status: Former Smoker    Packs/day: 2.00    Years: 30.00    Types: Cigarettes    Quit date: 12/26/1989  . Smokeless tobacco: Never Used  . Alcohol use 0.0 oz/week     Comment: 02/08/2016 "might have a couple drinks/year; might not"  . Drug use: No  . Sexual activity: No   Other Topics Concern  .  Not on file   Social History Narrative   Retired Development worker, international aid    Current Outpatient Prescriptions on File Prior to Visit  Medication Sig Dispense Refill  . ASACOL HD 800 MG TBEC Take 1,600 mg by mouth 2 (two) times daily.  3  . atenolol (TENORMIN) 25 MG tablet TAKE 1/2 TABLET BY MOUTH DAILY 45 tablet 0  . B-D ULTRAFINE III SHORT PEN 31G X 8 MM MISC USE FOUR TIMES DAILY AS DIRECTED 400 each 0  . BAYER CONTOUR TEST test strip TEST BLOOD SUGAR TWICE DAILY AS DIRECTED. 200 each 2  . busPIRone (BUSPAR) 15 MG tablet Take 1 tablet (15 mg total) by mouth 2 (two) times daily. 180 tablet 0  . calcium carbonate (OS-CAL) 1250 (500 CA) MG chewable tablet Chew 1 tablet by mouth 2 (two) times daily.    .  cyclobenzaprine (FLEXERIL) 10 MG tablet Take 1 tablet (10 mg total) by mouth 3 (three) times daily as needed for muscle spasms. 90 tablet 2  . Glucose Blood (ASCENSIA CONTOUR TEST VI) Use as directed to test two times a day     . HUMALOG KWIKPEN 100 UNIT/ML KiwkPen INJECT UNDER THE SKIN THREE TIMES DAILY( 25 UNITS, 25 UNITS AND 20 UNITS RESPECTIVELY BEFORE MEALS) 30 mL 0  . HYDROcodone-acetaminophen (NORCO/VICODIN) 5-325 MG tablet Take 1 tablet by mouth every 6 (six) hours as needed for moderate pain. 30 tablet 0  . insulin lispro (HUMALOG KWIKPEN) 100 UNIT/ML KiwkPen 3 times a day (just before each meal) 20-15-25 units (Patient taking differently: Inject 15-25 Units into the skin 3 (three) times daily. 20 units every morning  15 units at lunch  25 units ad bedtime) 30 mL 11  . Insulin Pen Needle (B-D ULTRAFINE III SHORT PEN) 31G X 8 MM MISC 1 each by Other route 4 (four) times daily. 400 each 1  . leuprolide (LUPRON) 11.25 MG injection Inject 11.25 mg into the muscle every 6 (six) months.     . levothyroxine (SYNTHROID, LEVOTHROID) 175 MCG tablet TAKE 1 TABLET BY MOUTH EVERY DAY BEFORE BREAKFAST. 90 tablet 0  . lisinopril-hydrochlorothiazide (PRINZIDE,ZESTORETIC) 20-12.5 MG tablet TAKE 1 TABLET BY MOUTH EVERY DAY 90 tablet 0  . Multiple Vitamins-Minerals (CENTRUM SILVER) tablet Take 1 tablet by mouth daily.    . ondansetron (ZOFRAN) 4 MG tablet Take 1 tablet (4 mg total) by mouth every 8 (eight) hours as needed for nausea or vomiting. (Patient not taking: Reported on 03/05/2016) 20 tablet 0  . oxyCODONE-acetaminophen (PERCOCET/ROXICET) 5-325 MG tablet Take 2 tablets by mouth every 4 (four) hours as needed for severe pain. (Patient not taking: Reported on 03/05/2016) 60 tablet 0  . senna-docusate (SENOKOT-S) 8.6-50 MG tablet Take 1 tablet by mouth daily. (Patient not taking: Reported on 03/05/2016) 30 tablet 0   No current facility-administered medications on file prior to visit.     No Known  Allergies  Family History  Problem Relation Age of Onset  . Cancer Neg Hx     No cancer in the patient's immediate family, except of course for the patient himself, as noted    BP 134/64   Pulse 84   Ht '5\' 9"'$  (1.753 m)   Wt 205 lb (93 kg)   BMI 30.27 kg/m    Review of Systems Denies LOC.  He has lost weight since the injury.  Right elbow wound persists--it is itchy.      Objective:   Physical Exam VITAL SIGNS:  See vs page GENERAL: no distress  ABDOMEN: abdomen is soft, nontender.  no hepatosplenomegaly.  not distended.  no hernia Pulses: dorsalis pedis intact bilat.   MSK: no deformity of the feet.  CV: no leg edema.  Skin:  no ulcer on the feet.  normal color and temp on the feet.  On the extensor aspect of the right elbow, there is a 5 cm area of superficially ulcerated and irritated skin Neuro: sensation is intact to touch on the feet, but decreased from normal Ext: There is bilateral onychomycosis of the toenails.      Lab Results  Component Value Date   HGBA1C 6.6 (H) 02/07/2016      Assessment & Plan:  abd pain, recurrent Insulin-requiring type 2 DM: worse Ulcerated area of the elbow, uncertain etiology.  We'll follow on rx.  Inappropriate remarks by pt's wife.  I told pt that is this context, our doctor-patent relationship must end.

## 2016-03-05 NOTE — Telephone Encounter (Signed)
Patient's daughter Vinnie Level called about the patient's earlier appointment. After the patient left the office he talked with his daughter and he advised her of the comments his wife had made during the office visit. The patient's daughter wanted to apologize on behalf of her mom about the comments and see if there was any way we could take the patient back as a PCP patient. She stated the patient is very appreciative that we see him for PCP and endocrinology and wanted to know if anything could be done. She stated the patient's wife would never come with the patient to any other visits.  Please advise on how to proceed.

## 2016-03-05 NOTE — Telephone Encounter (Signed)
I appreciate your concern, but it can't be undone.

## 2016-03-06 ENCOUNTER — Telehealth: Payer: Self-pay | Admitting: Endocrinology

## 2016-03-06 NOTE — Telephone Encounter (Signed)
Called Pt's Daughter Vinnie Level and informed her that the dismissal is universal for all three MDs, the pt cannot be seen at [facility] at all

## 2016-03-06 NOTE — Telephone Encounter (Signed)
Patient dismissed from [facility] by Renato Shin MD , effective March 05, 2016. Dismissal letter sent out by certified / registered mail.  DAJ

## 2016-03-06 NOTE — Telephone Encounter (Signed)
I contacted the patient's daughter and advised of message. She voiced understanding but wanted to know if he could be scheduled with one of our other endocrinologists. She requested a call back from the to supervisor to discuss further. [name], could you contact Vinnie Level at [phone]?

## 2016-03-14 ENCOUNTER — Ambulatory Visit
Admission: RE | Admit: 2016-03-14 | Discharge: 2016-03-14 | Disposition: A | Discharge status: Home / Self Care | Payer: Medicare Other | Source: Ambulatory Visit | Attending: Endocrinology | Admitting: Endocrinology

## 2016-03-14 ENCOUNTER — Other Ambulatory Visit: Payer: Self-pay | Admitting: Endocrinology

## 2016-03-14 DIAGNOSIS — K819 Cholecystitis, unspecified: Secondary | ICD-10-CM

## 2016-03-14 DIAGNOSIS — K8020 Calculus of gallbladder without cholecystitis without obstruction: Secondary | ICD-10-CM | POA: Diagnosis not present

## 2016-03-14 DIAGNOSIS — R1084 Generalized abdominal pain: Secondary | ICD-10-CM

## 2016-03-14 NOTE — Telephone Encounter (Signed)
Received signed domestic return receipt verifying delivery of certified letter on March 08, 2016. Article number [id number] [place] DAJ

## 2016-03-18 ENCOUNTER — Other Ambulatory Visit: Payer: Self-pay | Admitting: General Surgery

## 2016-03-18 DIAGNOSIS — E119 Type 2 diabetes mellitus without complications: Secondary | ICD-10-CM | POA: Diagnosis not present

## 2016-03-18 DIAGNOSIS — R1011 Right upper quadrant pain: Secondary | ICD-10-CM | POA: Diagnosis not present

## 2016-03-18 DIAGNOSIS — Z85850 Personal history of malignant neoplasm of thyroid: Secondary | ICD-10-CM | POA: Diagnosis not present

## 2016-03-18 DIAGNOSIS — E1142 Type 2 diabetes mellitus with diabetic polyneuropathy: Secondary | ICD-10-CM | POA: Diagnosis not present

## 2016-03-18 DIAGNOSIS — K5090 Crohn's disease, unspecified, without complications: Secondary | ICD-10-CM | POA: Diagnosis not present

## 2016-03-18 DIAGNOSIS — K8020 Calculus of gallbladder without cholecystitis without obstruction: Secondary | ICD-10-CM | POA: Diagnosis not present

## 2016-03-18 DIAGNOSIS — Z794 Long term (current) use of insulin: Secondary | ICD-10-CM | POA: Diagnosis not present

## 2016-03-18 DIAGNOSIS — K439 Ventral hernia without obstruction or gangrene: Secondary | ICD-10-CM | POA: Diagnosis not present

## 2016-03-18 DIAGNOSIS — C61 Malignant neoplasm of prostate: Secondary | ICD-10-CM | POA: Diagnosis not present

## 2016-03-18 DIAGNOSIS — Z9889 Other specified postprocedural states: Secondary | ICD-10-CM | POA: Diagnosis not present

## 2016-03-18 DIAGNOSIS — C3492 Malignant neoplasm of unspecified part of left bronchus or lung: Secondary | ICD-10-CM | POA: Diagnosis not present

## 2016-03-19 ENCOUNTER — Other Ambulatory Visit (HOSPITAL_COMMUNITY): Payer: Self-pay | Admitting: General Surgery

## 2016-03-19 DIAGNOSIS — K8011 Calculus of gallbladder with chronic cholecystitis with obstruction: Secondary | ICD-10-CM

## 2016-03-25 DIAGNOSIS — M546 Pain in thoracic spine: Secondary | ICD-10-CM | POA: Diagnosis not present

## 2016-03-25 DIAGNOSIS — R29898 Other symptoms and signs involving the musculoskeletal system: Secondary | ICD-10-CM | POA: Diagnosis not present

## 2016-03-25 DIAGNOSIS — M6281 Muscle weakness (generalized): Secondary | ICD-10-CM | POA: Diagnosis not present

## 2016-03-25 DIAGNOSIS — M545 Low back pain: Secondary | ICD-10-CM | POA: Diagnosis not present

## 2016-03-28 ENCOUNTER — Ambulatory Visit (HOSPITAL_COMMUNITY)
Admission: RE | Admit: 2016-03-28 | Discharge: 2016-03-28 | Disposition: A | Discharge status: Home / Self Care | Payer: Medicare Other | Source: Ambulatory Visit | Attending: General Surgery | Admitting: General Surgery

## 2016-03-28 ENCOUNTER — Other Ambulatory Visit: Payer: Self-pay | Admitting: Endocrinology

## 2016-03-28 DIAGNOSIS — K8011 Calculus of gallbladder with chronic cholecystitis with obstruction: Secondary | ICD-10-CM

## 2016-03-28 DIAGNOSIS — R1011 Right upper quadrant pain: Secondary | ICD-10-CM | POA: Diagnosis not present

## 2016-03-28 DIAGNOSIS — K8020 Calculus of gallbladder without cholecystitis without obstruction: Secondary | ICD-10-CM | POA: Insufficient documentation

## 2016-03-28 MED ORDER — TECHNETIUM TC 99M MEBROFENIN IV KIT: 5.0000 | PACK | Freq: Once | INTRAVENOUS | Status: DC | PRN

## 2016-03-28 NOTE — Telephone Encounter (Signed)
Please refill each x 1

## 2016-03-29 DIAGNOSIS — M545 Low back pain: Secondary | ICD-10-CM | POA: Diagnosis not present

## 2016-03-29 DIAGNOSIS — M6281 Muscle weakness (generalized): Secondary | ICD-10-CM | POA: Diagnosis not present

## 2016-03-29 DIAGNOSIS — M546 Pain in thoracic spine: Secondary | ICD-10-CM | POA: Diagnosis not present

## 2016-03-29 DIAGNOSIS — R29898 Other symptoms and signs involving the musculoskeletal system: Secondary | ICD-10-CM | POA: Diagnosis not present

## 2016-04-01 DIAGNOSIS — M545 Low back pain: Secondary | ICD-10-CM | POA: Diagnosis not present

## 2016-04-01 DIAGNOSIS — M6281 Muscle weakness (generalized): Secondary | ICD-10-CM | POA: Diagnosis not present

## 2016-04-01 DIAGNOSIS — M546 Pain in thoracic spine: Secondary | ICD-10-CM | POA: Diagnosis not present

## 2016-04-01 DIAGNOSIS — R29898 Other symptoms and signs involving the musculoskeletal system: Secondary | ICD-10-CM | POA: Diagnosis not present

## 2016-04-02 ENCOUNTER — Other Ambulatory Visit: Payer: Self-pay | Admitting: General Surgery

## 2016-04-02 ENCOUNTER — Other Ambulatory Visit: Payer: Self-pay | Admitting: Endocrinology

## 2016-04-02 DIAGNOSIS — R1011 Right upper quadrant pain: Secondary | ICD-10-CM | POA: Diagnosis not present

## 2016-04-02 DIAGNOSIS — E1142 Type 2 diabetes mellitus with diabetic polyneuropathy: Secondary | ICD-10-CM | POA: Diagnosis not present

## 2016-04-02 DIAGNOSIS — K5090 Crohn's disease, unspecified, without complications: Secondary | ICD-10-CM | POA: Diagnosis not present

## 2016-04-02 DIAGNOSIS — K439 Ventral hernia without obstruction or gangrene: Secondary | ICD-10-CM | POA: Diagnosis not present

## 2016-04-02 DIAGNOSIS — Z9889 Other specified postprocedural states: Secondary | ICD-10-CM | POA: Diagnosis not present

## 2016-04-02 DIAGNOSIS — C3492 Malignant neoplasm of unspecified part of left bronchus or lung: Secondary | ICD-10-CM | POA: Diagnosis not present

## 2016-04-02 DIAGNOSIS — Z85850 Personal history of malignant neoplasm of thyroid: Secondary | ICD-10-CM | POA: Diagnosis not present

## 2016-04-02 DIAGNOSIS — Z794 Long term (current) use of insulin: Secondary | ICD-10-CM | POA: Diagnosis not present

## 2016-04-02 DIAGNOSIS — E119 Type 2 diabetes mellitus without complications: Secondary | ICD-10-CM | POA: Diagnosis not present

## 2016-04-02 DIAGNOSIS — C61 Malignant neoplasm of prostate: Secondary | ICD-10-CM | POA: Diagnosis not present

## 2016-04-02 DIAGNOSIS — K8020 Calculus of gallbladder without cholecystitis without obstruction: Secondary | ICD-10-CM | POA: Diagnosis not present

## 2016-04-02 NOTE — Telephone Encounter (Signed)
This patient has been dismissed from [facility]. Can we refill this I am not sure if the 30 day grace period is up?

## 2016-04-02 NOTE — Telephone Encounter (Signed)
Yes, please refill x 1

## 2016-04-03 ENCOUNTER — Telehealth: Payer: Self-pay | Admitting: Endocrinology

## 2016-04-03 MED ORDER — ATENOLOL 25 MG PO TABS: 12.5000 mg | ORAL_TABLET | Freq: Every day | ORAL | 0 refills | Status: DC

## 2016-04-03 NOTE — Telephone Encounter (Signed)
Refill submitted per patient's request.  

## 2016-04-03 NOTE — Telephone Encounter (Signed)
Pt needs refill of atenolol called into [facility] [address]

## 2016-04-04 ENCOUNTER — Telehealth: Payer: Self-pay | Admitting: Endocrinology

## 2016-04-04 DIAGNOSIS — Z01810 Encounter for preprocedural cardiovascular examination: Secondary | ICD-10-CM | POA: Diagnosis not present

## 2016-04-04 DIAGNOSIS — M6281 Muscle weakness (generalized): Secondary | ICD-10-CM | POA: Diagnosis not present

## 2016-04-04 DIAGNOSIS — E785 Hyperlipidemia, unspecified: Secondary | ICD-10-CM | POA: Diagnosis not present

## 2016-04-04 DIAGNOSIS — R29898 Other symptoms and signs involving the musculoskeletal system: Secondary | ICD-10-CM | POA: Diagnosis not present

## 2016-04-04 DIAGNOSIS — I10 Essential (primary) hypertension: Secondary | ICD-10-CM | POA: Diagnosis not present

## 2016-04-04 DIAGNOSIS — M546 Pain in thoracic spine: Secondary | ICD-10-CM | POA: Diagnosis not present

## 2016-04-04 DIAGNOSIS — M545 Low back pain: Secondary | ICD-10-CM | POA: Diagnosis not present

## 2016-04-04 MED ORDER — ATENOLOL 25 MG PO TABS: 12.5000 mg | ORAL_TABLET | Freq: Every day | ORAL | 0 refills | Status: DC

## 2016-04-04 NOTE — Telephone Encounter (Signed)
Refill submitted per patient's request.  

## 2016-04-04 NOTE — Telephone Encounter (Signed)
Pt needs [place] to call in a new rx to a new [name] [address] the atenolol

## 2016-04-05 ENCOUNTER — Encounter: Payer: Medicare Other | Admitting: Endocrinology

## 2016-04-09 DIAGNOSIS — M546 Pain in thoracic spine: Secondary | ICD-10-CM | POA: Diagnosis not present

## 2016-04-09 DIAGNOSIS — M545 Low back pain: Secondary | ICD-10-CM | POA: Diagnosis not present

## 2016-04-09 DIAGNOSIS — M6281 Muscle weakness (generalized): Secondary | ICD-10-CM | POA: Diagnosis not present

## 2016-04-09 DIAGNOSIS — R29898 Other symptoms and signs involving the musculoskeletal system: Secondary | ICD-10-CM | POA: Diagnosis not present

## 2016-04-15 DIAGNOSIS — S22078A Other fracture of T9-T10 vertebra, initial encounter for closed fracture: Secondary | ICD-10-CM | POA: Diagnosis not present

## 2016-04-16 DIAGNOSIS — R29898 Other symptoms and signs involving the musculoskeletal system: Secondary | ICD-10-CM | POA: Diagnosis not present

## 2016-04-16 DIAGNOSIS — M545 Low back pain: Secondary | ICD-10-CM | POA: Diagnosis not present

## 2016-04-16 DIAGNOSIS — M6281 Muscle weakness (generalized): Secondary | ICD-10-CM | POA: Diagnosis not present

## 2016-04-16 DIAGNOSIS — M546 Pain in thoracic spine: Secondary | ICD-10-CM | POA: Diagnosis not present

## 2016-04-17 DIAGNOSIS — C61 Malignant neoplasm of prostate: Secondary | ICD-10-CM | POA: Diagnosis not present

## 2016-04-24 DIAGNOSIS — C7951 Secondary malignant neoplasm of bone: Secondary | ICD-10-CM | POA: Diagnosis not present

## 2016-04-24 DIAGNOSIS — C61 Malignant neoplasm of prostate: Secondary | ICD-10-CM | POA: Diagnosis not present

## 2016-04-24 DIAGNOSIS — Z5111 Encounter for antineoplastic chemotherapy: Secondary | ICD-10-CM | POA: Diagnosis not present

## 2016-04-29 ENCOUNTER — Other Ambulatory Visit: Payer: Self-pay

## 2016-04-29 ENCOUNTER — Encounter (HOSPITAL_COMMUNITY)
Admission: RE | Admit: 2016-04-29 | Discharge: 2016-04-29 | Disposition: A | Discharge status: Home / Self Care | Payer: Medicare Other | Source: Ambulatory Visit | Attending: General Surgery | Admitting: General Surgery

## 2016-04-29 ENCOUNTER — Encounter (HOSPITAL_COMMUNITY): Payer: Self-pay

## 2016-04-29 DIAGNOSIS — K8080 Other cholelithiasis without obstruction: Secondary | ICD-10-CM | POA: Insufficient documentation

## 2016-04-29 DIAGNOSIS — Z01812 Encounter for preprocedural laboratory examination: Secondary | ICD-10-CM | POA: Insufficient documentation

## 2016-04-29 DIAGNOSIS — N183 Chronic kidney disease, stage 3 (moderate): Secondary | ICD-10-CM | POA: Insufficient documentation

## 2016-04-29 DIAGNOSIS — Z87891 Personal history of nicotine dependence: Secondary | ICD-10-CM | POA: Diagnosis not present

## 2016-04-29 HISTORY — DX: Essential (primary) hypertension: I10

## 2016-04-29 LAB — CBC WITH DIFFERENTIAL/PLATELET
Basophils Absolute: 0 10*3/uL (ref 0.0–0.1)
Basophils Relative: 0 %
Eosinophils Absolute: 0.2 10*3/uL (ref 0.0–0.7)
Eosinophils Relative: 1 %
HCT: 39.6 % (ref 39.0–52.0)
Hemoglobin: 12.9 g/dL — ABNORMAL LOW (ref 13.0–17.0)
Lymphocytes Relative: 20 %
Lymphs Abs: 2.1 10*3/uL (ref 0.7–4.0)
MCH: 30.7 pg (ref 26.0–34.0)
MCHC: 32.6 g/dL (ref 30.0–36.0)
MCV: 94.3 fL (ref 78.0–100.0)
Monocytes Absolute: 0.5 10*3/uL (ref 0.1–1.0)
Monocytes Relative: 5 %
Neutro Abs: 7.8 10*3/uL — ABNORMAL HIGH (ref 1.7–7.7)
Neutrophils Relative %: 74 %
Platelets: 272 10*3/uL (ref 150–400)
RBC: 4.2 MIL/uL — ABNORMAL LOW (ref 4.22–5.81)
RDW: 14.8 % (ref 11.5–15.5)
WBC: 10.5 10*3/uL (ref 4.0–10.5)

## 2016-04-29 LAB — COMPREHENSIVE METABOLIC PANEL
ALT: 31 U/L (ref 17–63)
AST: 35 U/L (ref 15–41)
Albumin: 3.7 g/dL (ref 3.5–5.0)
Alkaline Phosphatase: 100 U/L (ref 38–126)
Anion gap: 8 (ref 5–15)
BUN: 20 mg/dL (ref 6–20)
CO2: 25 mmol/L (ref 22–32)
Calcium: 9.6 mg/dL (ref 8.9–10.3)
Chloride: 108 mmol/L (ref 101–111)
Creatinine, Ser: 1.5 mg/dL — ABNORMAL HIGH (ref 0.61–1.24)
GFR calc Af Amer: 52 mL/min — ABNORMAL LOW (ref >60)
GFR calc non Af Amer: 44 mL/min — ABNORMAL LOW (ref >60)
Glucose, Bld: 199 mg/dL — ABNORMAL HIGH (ref 65–99)
Potassium: 3.9 mmol/L (ref 3.5–5.1)
Sodium: 141 mmol/L (ref 135–145)
Total Bilirubin: 0.2 mg/dL — ABNORMAL LOW (ref 0.3–1.2)
Total Protein: 6.2 g/dL — ABNORMAL LOW (ref 6.5–8.1)

## 2016-04-29 LAB — GLUCOSE, CAPILLARY: Glucose-Capillary: 190 mg/dL — ABNORMAL HIGH (ref 65–99)

## 2016-04-29 NOTE — Pre-Procedure Instructions (Addendum)
Devin [name]  04/29/2016      [facility], [place] [address] Phone: [phone] Fax: [phone]  [place], [place] [facility] [address] [place] [address] Phone: [phone] Fax: [phone]  [facility] [place], [place] - [address] AT [address] [place] [address] Phone: [phone] Fax: [place] [facility] [address] [place] - [address] [place] [address] Phone: [phone] Fax: [phone]    Your procedure is scheduled on 05/07/16.  Report to [facility] Admitting at 930 A.M.  Call this number if you have problems the morning of surgery:  [phone]   Remember:  Do not eat food or drink liquids after midnight.  Take these medicines the morning of surgery with A SIP OF WATER    Atenolol, buspar,gabapentin,levothyroxine  STOP all herbel meds, nsaids (aleve,naproxen,advil,ibuprofen) 7 days prior to surgery starting 04/30/16 including aspirin, all vitamins, ascol     How to Manage Your Diabetes Before and After Surgery  Why is it important to control my blood sugar before and after surgery? . Improving blood sugar levels before and after surgery helps healing and can limit problems. . A way of improving blood sugar control is eating a healthy diet by: o  Eating less sugar and carbohydrates o  Increasing activity/exercise o  Talking with your doctor about reaching your blood sugar goals . High blood sugars (greater than 180 mg/dL) can raise your risk of infections and slow your recovery, so you will need to focus on controlling your diabetes during the weeks before surgery. . Make sure that the doctor who takes care of your diabetes knows about your planned surgery including  the date and location.  How do I manage my blood sugar before surgery? . Check your blood sugar at least 4 times a day, starting 2 days before surgery, to make sure that the level is not too high or low. o Check your blood sugar the morning of your surgery when you wake up and every 2 hours until you get to the Short Stay unit. . If your blood sugar is less than 70 mg/dL, you will need to treat for low blood sugar: o Do not take insulin. o Treat a low blood sugar (less than 70 mg/dL) with  cup of clear juice (cranberry or apple), 4 glucose tablets, OR glucose gel. o Recheck blood sugar in 15 minutes after treatment (to make sure it is greater than 70 mg/dL). If your blood sugar is not greater than 70 mg/dL on recheck, call [phone] for further instructions. . Report your blood sugar to the short stay nurse when you get to Short Stay.  . If you are admitted to the hospital after surgery: o Your blood sugar will be checked by the staff and you will probably be given insulin after surgery (instead of oral diabetes medicines) to make sure you have good blood sugar levels. o The goal for blood sugar control after surgery is 80-180 mg/dL.    WHAT DO I DO ABOUT MY DIABETES MEDICATION?   Marland Kitchen Do not take oral diabetes medicines (pills) the morning of surgery.  No bedtime dose of insulin   . THE MORNING OF SURGERY, follow instructions below on  humalog   . If your CBG is greater than 220 mg/dL, you may take  of your sliding scale (correction) dose of insulin.   Do not wear jewelry, make-up or nail polish.  Do not wear lotions, powders, or perfumes, or deoderant.  Do not shave 48 hours prior to surgery.  Men may shave face and neck.  Do not bring valuables to the hospital.  [facility] is not responsible for any belongings or valuables.  Contacts, dentures or bridgework may not be worn into surgery.  Leave your suitcase in the car.  After surgery it may be brought to your room.  For  patients admitted to the hospital, discharge time will be determined by your treatment team.  Patients discharged the day of surgery will not be allowed to drive home.    Special instructions:   Special Instructions: [place] - Preparing for Surgery  Before surgery, you can play an important role.  Because skin is not sterile, your skin needs to be as free of germs as possible.  You can reduce the number of germs on you skin by washing with CHG (chlorahexidine gluconate) soap before surgery.  CHG is an antiseptic cleaner which kills germs and bonds with the skin to continue killing germs even after washing.  Please DO NOT use if you have an allergy to CHG or antibacterial soaps.  If your skin becomes reddened/irritated stop using the CHG and inform your nurse when you arrive at Short Stay.  Do not shave (including legs and underarms) for at least 48 hours prior to the first CHG shower.  You may shave your face.  Please follow these instructions carefully:   1.  Shower with CHG Soap the night before surgery and the morning of Surgery.  2.  If you choose to wash your hair, wash your hair first as usual with your normal shampoo.  3.  After you shampoo, rinse your hair and body thoroughly to remove the Shampoo.  4.  Use CHG as you would any other liquid soap.  You can apply chg directly  to the skin and wash gently with scrungie or a clean washcloth.  5.  Apply the CHG Soap to your body ONLY FROM THE NECK DOWN.  Do not use on open wounds or open sores.  Avoid contact with your eyes ears, mouth and genitals (private parts).  Wash genitals (private parts)       with your normal soap.  6.  Wash thoroughly, paying special attention to the area where your surgery will be performed.  7.  Thoroughly rinse your body with warm water from the neck down.  8.  DO NOT shower/wash with your normal soap after using and rinsing off the CHG Soap.  9.  Pat yourself dry with a clean towel.            10.  Wear  clean pajamas.            11.  Place clean sheets on your bed the night of your first shower and do not sleep with pets.  Day of Surgery  Do not apply any lotions/deodorants the morning of surgery.  Please wear clean clothes to the hospital/surgery center.  Please read over the fact sheets that you were given.

## 2016-04-30 LAB — HEMOGLOBIN A1C
Hgb A1c MFr Bld: 6.5 % — ABNORMAL HIGH (ref 4.8–5.6)
Mean Plasma Glucose: 140 mg/dL

## 2016-04-30 NOTE — Progress Notes (Signed)
Anesthesia Chart Review: Patient is a 75 year old male scheduled for cholecystectomy, possible open on 05/07/2016 by Dr. Dalbert Batman.   History includes former smoker, psoriasis, Crohn's disease, ankylosing spondylitis, IDDM with neuropathy and nephropathy (CKD stage III), gout, osteoporosis, thyroid cancer s/p RAI and thyroidectomy '99, hypothyroidism, hypocalcemia, obesity, prostate cancer s/p prostatectomy with recurrence '12 s/p Lupron, T10-L4 fusion 12/16/14 and T7-T11 fusion 02/08/16, [place] lung cancer s/p left VATS/mini-thoracotomy LU lobectomy and mediastinal LN dissection 03/16/15.  - PCP is listed as Dr. Prince Solian. He was dismissed from [facility] practice after 03/05/16 office visit with Dr. Renato Shin.  - HEM-ONC is Dr. Curt Bears. - Urologist is Dr. Raynelle Bring. - He was seen by cardiologist Dr. Wynonia Lawman on 04/04/16 for a pre-operative evaluation. He wrote, "From a cardiovascular viewpoint may proceed with the planned cholecystectomy. No additional cardiovascular work-up is necessary. He was previously evaluated on 03/10/15 prior to his lung surgery and in 2004 when patient had an abnormal exercise stress test followed by a cardiac cath showing no significant disease.  Meds include Asacol, atenolol, Buspar, Flexeril, Neurontin, Humalog, Lupron, levothyroxine, lisinopril-HCTZ, Zofran.  BP (!) 130/52   Pulse 98   Temp 36.8 C   Resp 20   Ht '5\' 9"'$  (1.753 m)   Wt 214 lb (97.1 kg)   SpO2 100%   BMI 31.60 kg/m   04/29/16 EKG: NSR, low voltage QRS, inferior infarct (age undetermined). No significant change since last tracing.  03/17/03 Cardiac cath: IMPRESSION: 1. Very minimal coronary artery disease (10-20% LAD) with no significant obstructive stenoses noted.  2. Normal left ventricular function. LVEF 60%.  02/08/16 CXR: IMPRESSION: Scarring in the lung bases. No edema or consolidation. No new opacity. Stable cardiac silhouette. Persistent elevation of the right  hemidiaphragm.  01/10/16 Chest CT: IMPRESSION: 1. Stable CT of the chest. No definite evidence for local tumor recurrence or metastatic disease status post left upper lobectomy.  Preoperative labs noted. BUN 20, Cr 1.50 (known CKD stage III; Cr appears consistent with labs since 04/2015; Cr range primarily 1.4-1.7 since at least 2009 but with a few in the 1.1-1.3 range in 2016). H/H 12.9/39.6. Glucose 199. A1c 6.5.   If no acute changes then I anticipate that he can proceed as planned.  George Hugh [facility] Short Stay Center/Anesthesiology Phone [phone] 04/30/2016 2:57 PM

## 2016-05-05 NOTE — H&P (Signed)
[name] Location: [facility] Patient #: [id number] DOB: [birth year] Married / Language: English / Race: White Male        History of Present Illness  The patient is a 75 year old male who presents for evaluation of gall stones. This is a 75 year old man who returns to discuss whether or not to have cholecystectomy. His gastroenterologist is Dr. Earlean Shawl. His neurosurgeon is Dr. Ronnald Ramp. His cardiologist is Dr. Wynonia Lawman. He is in the process of reestablishing with a PCP. His wife sees Dr. Dagmar Hait.  I saw him on March 18, 2016. He had a compression fracture of his back in July 2016 in August 2017 he reinjured his back at another compression fracture of T9 and L1 and he had a second back operation by Dr. Ronnald Ramp. Since that time he's had burning pain in his right upper quadrant. His appetite is diminished. The pain is intermittent and usually in the morning. Dr. Ronnald Ramp put him on gabapentin to see if this was a neuropathic pain, but that did not help. No history of peptic ulcer disease. No change in bowel habits. I gave him Protonix 2 weeks ago and he has taken the operative does not alter his symptoms. History of partial obstruction Crohn's disease and underwent ileocolic resection and Meckel's diverticulectomy by Dr. Alphonsa Overall in 2008. A year later Dr. Lucia Gaskins performed a laparoscopic ventral hernia repair with parietex inlay mesh. He did well following that. In the distant past he had a radical prostatectomy for prostate cancer 2005.  CT scan shows gallstones and nonobstructing kidney stones. Ultrasound shows multiple stones, largest being 1.6 cm. Common duct not dilated. Gallbladder wall. Mildly thickened at 5 mm. Fatty infiltration of liver. Recent hepatobiliary scan shows good uptake and excretion down the common duct into the gallbladder and the duodenum but the ejection fraction is low at 26%, normal being greater than 3-33% using the oral agent. This did not  reproduce his symptoms.  Comorbidities include insulin-dependent diabetes, total thyroidectomy for cancer, 2 back surgeries in the last 12 months, colonoscopy 3 months ago, left thoracotomy for stage I lung cancer, laparoscopic ventral hernia repair, ileocolic resection for Crohn's disease, radical prostatectomy. Former smoker. 30 pack history. Family history negative for cancer syndromes.  I had a very long talk with the patient and his wife. I told him that cholecystectomy seemed appropriate to offer, but gave him no more than 80% chance that his symptoms would resolve. His symptoms may be a combination of biliary tract and neuropathic. He knows that he is at increased risk for surgery because of multiple prior surgeries and adhesions. Offered second opinion. I offered referral to gastroenterology. He stated he wanted to go ahead with this gallbladder surgery, understanding that his symptoms may or may not resolve. He knows that he clearly has a diseased gallbladder.  He'll be scheduled for laparoscopic cholecystectomy with IOC and, possible open cholecystectomy. I discussed the indications, details, technique, and numerous risk of the surgery with him and his wife. He is aware of the risk of bleeding, infection, conversion to open laparotomy, wound healing problems such as hernia, injury to adjacent organs requiring major reconstructive surgery, bile leak, failure to resolve his symptoms. He understands all these issues. All of his questions were answered. He agrees with this plan.  We will ask for cardiac clearance with Dr. Wynonia Lawman, although I believe he has been seen within the last few months.   Allergies  No Known Drug Allergies  Medication History ( Protonix (  40MG Tablet DR, 2 (two) Tablet Oral daily, Taken starting 03/18/2016) Active. Atenolol (25MG Tablet, Oral) Active. BusPIRone HCl (15MG Tablet, Oral) Active.  [employer] (In Vitro) Active. Cyclobenzaprine HCl  (10MG Tablet, Oral) Active. Gabapentin (300MG Capsule, Oral) Active. HumaLOG KwikPen (100UNIT/ML Soln Pen-inj, Subcutaneous) Active. Levothyroxine Sodium (175MCG Tablet, Oral) Active. Lisinopril-Hydrochlorothiazide (20-12.5MG Tablet, Oral) Active. Triamcinolone Acetonide (0.1% Cream, External) Active. Oxycodone-Acetaminophen (5-325MG Tablet, Oral as needed) Active. Medications Reconciled  Vitals  Weight: 216 lb Height: 69in Body Surface Area: 2.13 m Body Mass Index: 31.9 kg/m  Temp.: 98.7F(Temporal)  Pulse: 115 (Irregular)  BP: 132/80 (Sitting, Left Arm, Standard)    Physical Exam General Mental Status-Alert. General Appearance-Not in acute distress. Build & Nutrition-Well nourished. Posture-Normal posture. Gait-Normal.  Head and Neck Head-normocephalic, atraumatic with no lesions or palpable masses. Trachea-midline. Thyroid Gland Characteristics - normal size and consistency and no palpable nodules.  Chest and Lung Exam Chest and lung exam reveals -on auscultation, normal breath sounds, no adventitious sounds and normal vocal resonance. Note: Left thoracotomy scar.   Cardiovascular Cardiovascular examination reveals -normal heart sounds, regular rate and rhythm with no murmurs and femoral artery auscultation bilaterally reveals normal pulses, no bruits, no thrills.  Abdomen Inspection Inspection of the abdomen reveals - No Hernias. Palpation/Percussion Palpation and Percussion of the abdomen reveal - Soft, Non Tender, No Rigidity (guarding), No hepatosplenomegaly and No Palpable abdominal masses. Note: Abdomen is soft and nondistended.. Essentially nontender. Costal margins nontender. No mass. No hernia. Midline scar well healed. No organomegaly.   Neurologic Neurologic evaluation reveals -alert and oriented x 3 with no impairment of recent or remote memory, normal attention span and ability to concentrate, normal sensation  and normal coordination.  Musculoskeletal Normal Exam - Bilateral-Upper Extremity Strength Normal and Lower Extremity Strength Normal.    Assessment & Plan  GALLSTONES (K80.20)   Your hepatobiliary scan is somewhat abnormal, with a low ejection fraction. This suggests chronic cholecystitis, or biliary dyskinesia  Your right upper quadrant pain is a little atypical in that it is more of a burning sensation and is not related to meals. This continues to raise the question as to whether it might be related to nerve damage from the back surgery and back injury  Nevertheless, you have a diseased gallbladder and your biliary scan is abnormal It is appropriate for me to offer laparoscopic cholecystectomy, possible open cholecystectomy to you. You have decided to go ahead with the surgery I have discussed the indications, techniques, and numerous risk of this surgery with you and your wife in detail.  I've explained that it is not clear whether your symptoms will resolve partly or completely, but there is probably an 80% chance that it will be improved If your symptoms are not improved you would need a more extensive workup by your gastroenterologist I have offered referral back to your gastroenterologist at this point in time, and you have stated she would like to proceed with cholecystectomy.  Please read over the patient information booklet that I gave you.  Pt Education - Pamphlet Given - Laparoscopic Gallbladder Surgery: discussed with patient and provided information. The anatomy & physiology of hepatobiliary & pancreatic function was discussed. The pathophysiology of gallbladder dysfunction was discussed. Natural history risks without surgery was discussed. I feel the risks of no intervention will lead to serious problems that outweigh the operative risks; therefore, I recommended cholecystectomy to remove the pathology. I explained laparoscopic techniques with possible need for an open  approach. Probable cholangiogram to evaluate the  bilary tract was explained as well.  Risks such as bleeding, infection, abscess, leak, injury to other organs, need for further treatment, heart attack, death, and other risks were discussed. I noted a good likelihood this will help address the problem. Possibility that this will not correct all abdominal symptoms was explained. Goals of post-operative recovery were discussed as well. We will work to minimize complications. An educational handout further explaining the pathology and treatment options was given as well. Questions were answered. The patient expresses understanding & wishes to proceed with surgery.  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 Dalbert Batman, M.D., [facility] General and Minimally invasive Surgery Breast and Colorectal Surgery Office:   [phone] Pager:   [phone]

## 2016-05-07 ENCOUNTER — Encounter (HOSPITAL_COMMUNITY): Payer: Self-pay | Admitting: *Deleted

## 2016-05-07 ENCOUNTER — Ambulatory Visit (HOSPITAL_COMMUNITY): Payer: Medicare Other | Admitting: Emergency Medicine

## 2016-05-07 ENCOUNTER — Ambulatory Visit (HOSPITAL_COMMUNITY)
Admission: RE | Admit: 2016-05-07 | Discharge: 2016-05-08 | Disposition: A | Discharge status: Home / Self Care | Payer: Medicare Other | Source: Ambulatory Visit | Attending: General Surgery | Admitting: General Surgery

## 2016-05-07 ENCOUNTER — Encounter (HOSPITAL_COMMUNITY)
Admission: RE | Disposition: A | Discharge status: Home / Self Care | Payer: Self-pay | Source: Ambulatory Visit | Attending: General Surgery

## 2016-05-07 ENCOUNTER — Ambulatory Visit (HOSPITAL_COMMUNITY): Payer: Medicare Other | Admitting: Vascular Surgery

## 2016-05-07 DIAGNOSIS — Z8546 Personal history of malignant neoplasm of prostate: Secondary | ICD-10-CM | POA: Insufficient documentation

## 2016-05-07 DIAGNOSIS — E1142 Type 2 diabetes mellitus with diabetic polyneuropathy: Secondary | ICD-10-CM | POA: Insufficient documentation

## 2016-05-07 DIAGNOSIS — K8010 Calculus of gallbladder with chronic cholecystitis without obstruction: Secondary | ICD-10-CM | POA: Diagnosis not present

## 2016-05-07 DIAGNOSIS — E46 Unspecified protein-calorie malnutrition: Secondary | ICD-10-CM | POA: Diagnosis not present

## 2016-05-07 DIAGNOSIS — E039 Hypothyroidism, unspecified: Secondary | ICD-10-CM | POA: Diagnosis not present

## 2016-05-07 DIAGNOSIS — Z79899 Other long term (current) drug therapy: Secondary | ICD-10-CM | POA: Insufficient documentation

## 2016-05-07 DIAGNOSIS — Z794 Long term (current) use of insulin: Secondary | ICD-10-CM | POA: Diagnosis not present

## 2016-05-07 DIAGNOSIS — Z85118 Personal history of other malignant neoplasm of bronchus and lung: Secondary | ICD-10-CM | POA: Diagnosis not present

## 2016-05-07 DIAGNOSIS — K660 Peritoneal adhesions (postprocedural) (postinfection): Secondary | ICD-10-CM | POA: Insufficient documentation

## 2016-05-07 DIAGNOSIS — Z85850 Personal history of malignant neoplasm of thyroid: Secondary | ICD-10-CM | POA: Insufficient documentation

## 2016-05-07 DIAGNOSIS — R109 Unspecified abdominal pain: Secondary | ICD-10-CM | POA: Diagnosis not present

## 2016-05-07 DIAGNOSIS — K819 Cholecystitis, unspecified: Secondary | ICD-10-CM | POA: Diagnosis present

## 2016-05-07 DIAGNOSIS — K5090 Crohn's disease, unspecified, without complications: Secondary | ICD-10-CM | POA: Insufficient documentation

## 2016-05-07 DIAGNOSIS — C61 Malignant neoplasm of prostate: Secondary | ICD-10-CM | POA: Diagnosis not present

## 2016-05-07 HISTORY — PX: LAPAROSCOPIC LYSIS OF ADHESIONS: SHX5905

## 2016-05-07 HISTORY — PX: CHOLECYSTECTOMY: SHX55

## 2016-05-07 HISTORY — PX: LAPAROSCOPIC CHOLECYSTECTOMY: SUR755

## 2016-05-07 LAB — CBC
HCT: 38.5 % — ABNORMAL LOW (ref 39.0–52.0)
Hemoglobin: 12.4 g/dL — ABNORMAL LOW (ref 13.0–17.0)
MCH: 30.1 pg (ref 26.0–34.0)
MCHC: 32.2 g/dL (ref 30.0–36.0)
MCV: 93.4 fL (ref 78.0–100.0)
Platelets: 216 10*3/uL (ref 150–400)
RBC: 4.12 MIL/uL — ABNORMAL LOW (ref 4.22–5.81)
RDW: 14.5 % (ref 11.5–15.5)
WBC: 13.8 10*3/uL — ABNORMAL HIGH (ref 4.0–10.5)

## 2016-05-07 LAB — GLUCOSE, CAPILLARY
Glucose-Capillary: 166 mg/dL — ABNORMAL HIGH (ref 65–99)
Glucose-Capillary: 194 mg/dL — ABNORMAL HIGH (ref 65–99)
Glucose-Capillary: 215 mg/dL — ABNORMAL HIGH (ref 65–99)

## 2016-05-07 LAB — CREATININE, SERUM
Creatinine, Ser: 1.69 mg/dL — ABNORMAL HIGH (ref 0.61–1.24)
GFR calc Af Amer: 45 mL/min — ABNORMAL LOW (ref >60)
GFR calc non Af Amer: 38 mL/min — ABNORMAL LOW (ref >60)

## 2016-05-07 SURGERY — LAPAROSCOPIC CHOLECYSTECTOMY
Anesthesia: General | Site: Abdomen

## 2016-05-07 MED ORDER — CEFAZOLIN SODIUM-DEXTROSE 2-4 GM/100ML-% IV SOLN
2.0000 g | INTRAVENOUS | Status: AC
  Administered 2016-05-07: 2 g via INTRAVENOUS

## 2016-05-07 MED ORDER — SENNA 8.6 MG PO TABS
1.0000 | ORAL_TABLET | Freq: Two times a day (BID) | ORAL | Status: DC
  Filled 2016-05-07: qty 1

## 2016-05-07 MED ORDER — BUPIVACAINE HCL (PF) 0.5 % IJ SOLN
INTRAMUSCULAR | Status: DC | PRN
  Administered 2016-05-07: 11 mL

## 2016-05-07 MED ORDER — CEFAZOLIN SODIUM-DEXTROSE 2-4 GM/100ML-% IV SOLN
INTRAVENOUS | Status: AC
  Filled 2016-05-07: qty 100

## 2016-05-07 MED ORDER — SUGAMMADEX SODIUM 200 MG/2ML IV SOLN
INTRAVENOUS | Status: DC | PRN
  Administered 2016-05-07: 200 mg via INTRAVENOUS

## 2016-05-07 MED ORDER — ONDANSETRON HCL 4 MG/2ML IJ SOLN: 4.0000 mg | Freq: Four times a day (QID) | INTRAMUSCULAR | Status: DC | PRN

## 2016-05-07 MED ORDER — PHENYLEPHRINE 40 MCG/ML (10ML) SYRINGE FOR IV PUSH (FOR BLOOD PRESSURE SUPPORT)
PREFILLED_SYRINGE | INTRAVENOUS | Status: DC | PRN
  Administered 2016-05-07: 40 ug via INTRAVENOUS
  Administered 2016-05-07: 80 ug via INTRAVENOUS
  Administered 2016-05-07 (×2): 40 ug via INTRAVENOUS
  Administered 2016-05-07: 80 ug via INTRAVENOUS

## 2016-05-07 MED ORDER — HYDROCHLOROTHIAZIDE 12.5 MG PO CAPS
12.5000 mg | ORAL_CAPSULE | Freq: Every day | ORAL | Status: DC
  Administered 2016-05-07 – 2016-05-08 (×2): 12.5 mg via ORAL
  Filled 2016-05-07 (×2): qty 1

## 2016-05-07 MED ORDER — CHLORHEXIDINE GLUCONATE CLOTH 2 % EX PADS: 6.0000 | MEDICATED_PAD | Freq: Once | CUTANEOUS | Status: DC

## 2016-05-07 MED ORDER — LEUPROLIDE ACETATE (3 MONTH) 11.25 MG IM KIT: 11.2500 mg | PACK | INTRAMUSCULAR | Status: DC

## 2016-05-07 MED ORDER — LISINOPRIL 20 MG PO TABS
20.0000 mg | ORAL_TABLET | Freq: Every day | ORAL | Status: DC
  Administered 2016-05-07 – 2016-05-08 (×2): 20 mg via ORAL
  Filled 2016-05-07 (×2): qty 1

## 2016-05-07 MED ORDER — FENTANYL CITRATE (PF) 100 MCG/2ML IJ SOLN
INTRAMUSCULAR | Status: AC
  Filled 2016-05-07: qty 4

## 2016-05-07 MED ORDER — ENOXAPARIN SODIUM 40 MG/0.4ML ~~LOC~~ SOLN
40.0000 mg | SUBCUTANEOUS | Status: DC
  Administered 2016-05-08: 40 mg via SUBCUTANEOUS
  Filled 2016-05-07: qty 0.4

## 2016-05-07 MED ORDER — INSULIN GLARGINE 100 UNIT/ML ~~LOC~~ SOLN
10.0000 [IU] | Freq: Every day | SUBCUTANEOUS | Status: DC
  Administered 2016-05-07: 10 [IU] via SUBCUTANEOUS
  Filled 2016-05-07 (×2): qty 0.1

## 2016-05-07 MED ORDER — LIDOCAINE 2% (20 MG/ML) 5 ML SYRINGE
INTRAMUSCULAR | Status: AC
  Filled 2016-05-07: qty 5

## 2016-05-07 MED ORDER — FENTANYL CITRATE (PF) 100 MCG/2ML IJ SOLN
INTRAMUSCULAR | Status: DC | PRN
  Administered 2016-05-07: 50 ug via INTRAVENOUS
  Administered 2016-05-07: 100 ug via INTRAVENOUS

## 2016-05-07 MED ORDER — ROCURONIUM BROMIDE 10 MG/ML (PF) SYRINGE
PREFILLED_SYRINGE | INTRAVENOUS | Status: AC
  Filled 2016-05-07: qty 10

## 2016-05-07 MED ORDER — PROPOFOL 10 MG/ML IV BOLUS
INTRAVENOUS | Status: DC | PRN
  Administered 2016-05-07: 20 mg via INTRAVENOUS
  Administered 2016-05-07: 100 mg via INTRAVENOUS

## 2016-05-07 MED ORDER — GABAPENTIN 300 MG PO CAPS
300.0000 mg | ORAL_CAPSULE | Freq: Three times a day (TID) | ORAL | Status: DC
  Administered 2016-05-07 – 2016-05-08 (×3): 300 mg via ORAL
  Filled 2016-05-07 (×4): qty 1

## 2016-05-07 MED ORDER — PHENYLEPHRINE HCL 10 MG/ML IJ SOLN
INTRAVENOUS | Status: DC | PRN
  Administered 2016-05-07: 20 ug/min via INTRAVENOUS

## 2016-05-07 MED ORDER — KCL-LACTATED RINGERS-D5W 20 MEQ/L IV SOLN
INTRAVENOUS | Status: DC
  Administered 2016-05-07: 21:00:00 via INTRAVENOUS
  Filled 2016-05-07 (×6): qty 1000

## 2016-05-07 MED ORDER — ACETAMINOPHEN 500 MG PO TABS
1000.0000 mg | ORAL_TABLET | Freq: Two times a day (BID) | ORAL | Status: DC
  Administered 2016-05-07 – 2016-05-08 (×3): 1000 mg via ORAL
  Filled 2016-05-07 (×3): qty 2

## 2016-05-07 MED ORDER — SUGAMMADEX SODIUM 200 MG/2ML IV SOLN
INTRAVENOUS | Status: AC
  Filled 2016-05-07: qty 2

## 2016-05-07 MED ORDER — PANTOPRAZOLE SODIUM 40 MG IV SOLR
40.0000 mg | Freq: Every day | INTRAVENOUS | Status: DC
  Administered 2016-05-07: 40 mg via INTRAVENOUS
  Filled 2016-05-07: qty 40

## 2016-05-07 MED ORDER — HYDROCODONE-ACETAMINOPHEN 5-325 MG PO TABS: 1.0000 | ORAL_TABLET | ORAL | Status: DC | PRN

## 2016-05-07 MED ORDER — METOPROLOL TARTRATE 5 MG/5ML IV SOLN
INTRAVENOUS | Status: DC | PRN
  Administered 2016-05-07: 2.5 mg via INTRAVENOUS

## 2016-05-07 MED ORDER — MESALAMINE 400 MG PO CPDR
1600.0000 mg | DELAYED_RELEASE_CAPSULE | Freq: Two times a day (BID) | ORAL | Status: DC
  Administered 2016-05-07 – 2016-05-08 (×2): 1600 mg via ORAL
  Filled 2016-05-07 (×2): qty 4

## 2016-05-07 MED ORDER — INSULIN ASPART 100 UNIT/ML ~~LOC~~ SOLN
0.0000 [IU] | Freq: Three times a day (TID) | SUBCUTANEOUS | Status: DC
  Administered 2016-05-07 – 2016-05-08 (×2): 3 [IU] via SUBCUTANEOUS

## 2016-05-07 MED ORDER — SODIUM CHLORIDE 0.9 % IR SOLN
Status: DC | PRN
  Administered 2016-05-07: 1000 mL

## 2016-05-07 MED ORDER — SODIUM CHLORIDE 0.9 % IV SOLN
INTRAVENOUS | Status: DC | PRN
  Administered 2016-05-07: 50 mL

## 2016-05-07 MED ORDER — CEFAZOLIN SODIUM-DEXTROSE 2-4 GM/100ML-% IV SOLN
2.0000 g | Freq: Three times a day (TID) | INTRAVENOUS | Status: AC
  Administered 2016-05-07: 2 g via INTRAVENOUS
  Filled 2016-05-07: qty 100

## 2016-05-07 MED ORDER — 0.9 % SODIUM CHLORIDE (POUR BTL) OPTIME
TOPICAL | Status: DC | PRN
  Administered 2016-05-07: 1000 mL

## 2016-05-07 MED ORDER — LEVOTHYROXINE SODIUM 75 MCG PO TABS
175.0000 ug | ORAL_TABLET | Freq: Every day | ORAL | Status: DC
  Administered 2016-05-07 – 2016-05-08 (×2): 175 ug via ORAL
  Filled 2016-05-07 (×2): qty 1

## 2016-05-07 MED ORDER — PROPOFOL 10 MG/ML IV BOLUS
INTRAVENOUS | Status: AC
Start: 2016-05-07 — End: 2016-05-07
  Filled 2016-05-07: qty 20

## 2016-05-07 MED ORDER — FENTANYL CITRATE (PF) 100 MCG/2ML IJ SOLN: 25.0000 ug | INTRAMUSCULAR | Status: DC | PRN

## 2016-05-07 MED ORDER — BUPIVACAINE HCL (PF) 0.5 % IJ SOLN
INTRAMUSCULAR | Status: AC
  Filled 2016-05-07: qty 30

## 2016-05-07 MED ORDER — IOPAMIDOL (ISOVUE-300) INJECTION 61%
INTRAVENOUS | Status: AC
  Filled 2016-05-07: qty 50

## 2016-05-07 MED ORDER — METOPROLOL TARTRATE 5 MG/5ML IV SOLN
INTRAVENOUS | Status: AC
  Filled 2016-05-07: qty 5

## 2016-05-07 MED ORDER — LACTATED RINGERS IV SOLN
INTRAVENOUS | Status: DC
  Administered 2016-05-07 (×2): via INTRAVENOUS

## 2016-05-07 MED ORDER — OXYCODONE HCL 5 MG/5ML PO SOLN: 5.0000 mg | Freq: Once | ORAL | Status: DC | PRN

## 2016-05-07 MED ORDER — LISINOPRIL-HYDROCHLOROTHIAZIDE 20-12.5 MG PO TABS: 1.0000 | ORAL_TABLET | Freq: Every day | ORAL | Status: DC

## 2016-05-07 MED ORDER — ROCURONIUM BROMIDE 10 MG/ML (PF) SYRINGE
PREFILLED_SYRINGE | INTRAVENOUS | Status: DC | PRN
  Administered 2016-05-07 (×2): 10 mg via INTRAVENOUS
  Administered 2016-05-07: 40 mg via INTRAVENOUS
  Administered 2016-05-07: 10 mg via INTRAVENOUS

## 2016-05-07 MED ORDER — LIDOCAINE 2% (20 MG/ML) 5 ML SYRINGE
INTRAMUSCULAR | Status: DC | PRN
  Administered 2016-05-07: 40 mg via INTRAVENOUS

## 2016-05-07 MED ORDER — ONDANSETRON 4 MG PO TBDP: 4.0000 mg | ORAL_TABLET | Freq: Four times a day (QID) | ORAL | Status: DC | PRN

## 2016-05-07 MED ORDER — BUPIVACAINE-EPINEPHRINE (PF) 0.25% -1:200000 IJ SOLN
INTRAMUSCULAR | Status: AC
  Filled 2016-05-07: qty 30

## 2016-05-07 MED ORDER — ADULT MULTIVITAMIN W/MINERALS CH
1.0000 | ORAL_TABLET | Freq: Every day | ORAL | Status: DC
  Administered 2016-05-08: 1 via ORAL
  Filled 2016-05-07: qty 1

## 2016-05-07 MED ORDER — CALCIUM CARBONATE 1250 (500 CA) MG PO TABS
1250.0000 mg | ORAL_TABLET | Freq: Two times a day (BID) | ORAL | Status: DC
  Administered 2016-05-07 – 2016-05-08 (×2): 1250 mg via ORAL
  Filled 2016-05-07 (×2): qty 1

## 2016-05-07 MED ORDER — BUSPIRONE HCL 15 MG PO TABS
15.0000 mg | ORAL_TABLET | Freq: Two times a day (BID) | ORAL | Status: DC
  Administered 2016-05-07 – 2016-05-08 (×2): 15 mg via ORAL
  Filled 2016-05-07 (×2): qty 1

## 2016-05-07 MED ORDER — HYDROMORPHONE HCL 2 MG/ML IJ SOLN: 1.0000 mg | INTRAMUSCULAR | Status: DC | PRN

## 2016-05-07 MED ORDER — ONDANSETRON HCL 4 MG/2ML IJ SOLN
INTRAMUSCULAR | Status: AC
  Filled 2016-05-07: qty 2

## 2016-05-07 MED ORDER — PHENYLEPHRINE HCL 10 MG/ML IJ SOLN: INTRAMUSCULAR | Status: DC | PRN

## 2016-05-07 MED ORDER — OXYCODONE HCL 5 MG PO TABS: 5.0000 mg | ORAL_TABLET | Freq: Once | ORAL | Status: DC | PRN

## 2016-05-07 MED ORDER — ATENOLOL 25 MG PO TABS
12.5000 mg | ORAL_TABLET | Freq: Every day | ORAL | Status: DC
  Administered 2016-05-07 – 2016-05-08 (×2): 12.5 mg via ORAL
  Filled 2016-05-07 (×2): qty 1

## 2016-05-07 MED ORDER — ONDANSETRON HCL 4 MG/2ML IJ SOLN
INTRAMUSCULAR | Status: DC | PRN
  Administered 2016-05-07: 4 mg via INTRAVENOUS

## 2016-05-07 SURGICAL SUPPLY — 56 items
ADH SKN CLS APL DERMABOND .7 (GAUZE/BANDAGES/DRESSINGS) ×1
APPLIER CLIP 5 13 M/L LIGAMAX5 (MISCELLANEOUS) ×3
APPLIER CLIP ROT 10 11.4 M/L (STAPLE) ×3
APR CLP MED LRG 11.4X10 (STAPLE) ×2
APR CLP MED LRG 5 ANG JAW (MISCELLANEOUS) ×2
BAG SPEC RTRVL LRG 6X4 10 (ENDOMECHANICALS) ×2
BLADE SURG 10 STRL SS (BLADE) ×3 IMPLANT
BLADE SURG ROTATE 9660 (MISCELLANEOUS) IMPLANT
CANISTER SUCTION 2500CC (MISCELLANEOUS) ×3 IMPLANT
CHLORAPREP W/TINT 26ML (MISCELLANEOUS) ×3 IMPLANT
CLIP APPLIE 5 13 M/L LIGAMAX5 (MISCELLANEOUS) ×2 IMPLANT
CLIP APPLIE ROT 10 11.4 M/L (STAPLE) ×2 IMPLANT
COVER MAYO STAND STRL (DRAPES) ×3 IMPLANT
COVER SURGICAL LIGHT HANDLE (MISCELLANEOUS) ×3 IMPLANT
DERMABOND ADVANCED (GAUZE/BANDAGES/DRESSINGS) ×1
DERMABOND ADVANCED .7 DNX12 (GAUZE/BANDAGES/DRESSINGS) ×2 IMPLANT
DEVICE TROCAR PUNCTURE CLOSURE (ENDOMECHANICALS) ×3 IMPLANT
DRAPE C-ARM 42X72 X-RAY (DRAPES) ×3 IMPLANT
ELECT CAUTERY BLADE 6.4 (BLADE) ×3 IMPLANT
ELECT REM PT RETURN 9FT ADLT (ELECTROSURGICAL) ×3
ELECTRODE REM PT RTRN 9FT ADLT (ELECTROSURGICAL) ×2 IMPLANT
GLOVE BIO SURGEON STRL SZ8 (GLOVE) ×3 IMPLANT
GLOVE BIOGEL PI IND STRL 7.0 (GLOVE) ×4 IMPLANT
GLOVE BIOGEL PI IND STRL 8.5 (GLOVE) ×2 IMPLANT
GLOVE BIOGEL PI INDICATOR 7.0 (GLOVE) ×2
GLOVE BIOGEL PI INDICATOR 8.5 (GLOVE) ×1
GLOVE ECLIPSE 7.0 STRL STRAW (GLOVE) ×3 IMPLANT
GLOVE EUDERMIC 7 POWDERFREE (GLOVE) ×3 IMPLANT
GLOVE SURG ORTHO 8.0 STRL STRW (GLOVE) ×3 IMPLANT
GOWN STRL REUS W/ TWL LRG LVL3 (GOWN DISPOSABLE) ×2 IMPLANT
GOWN STRL REUS W/ TWL XL LVL3 (GOWN DISPOSABLE) ×6 IMPLANT
GOWN STRL REUS W/TWL LRG LVL3 (GOWN DISPOSABLE) ×3
GOWN STRL REUS W/TWL XL LVL3 (GOWN DISPOSABLE) ×9
KIT BASIN OR (CUSTOM PROCEDURE TRAY) ×3 IMPLANT
KIT ROOM TURNOVER OR (KITS) ×3 IMPLANT
NS IRRIG 1000ML POUR BTL (IV SOLUTION) ×3 IMPLANT
PAD ARMBOARD 7.5X6 YLW CONV (MISCELLANEOUS) ×3 IMPLANT
PENCIL BUTTON HOLSTER BLD 10FT (ELECTRODE) ×3 IMPLANT
POUCH SPECIMEN RETRIEVAL 10MM (ENDOMECHANICALS) ×3 IMPLANT
SCISSORS LAP 5X35 DISP (ENDOMECHANICALS) ×3 IMPLANT
SET CHOLANGIOGRAPH 5 50 .035 (SET/KITS/TRAYS/PACK) ×3 IMPLANT
SET IRRIG TUBING LAPAROSCOPIC (IRRIGATION / IRRIGATOR) ×3 IMPLANT
SHEARS HARMONIC HDI 36CM (ELECTROSURGICAL) ×3 IMPLANT
SLEEVE ENDOPATH XCEL 5M (ENDOMECHANICALS) ×9 IMPLANT
SPECIMEN JAR SMALL (MISCELLANEOUS) ×3 IMPLANT
SUT MNCRL AB 4-0 PS2 18 (SUTURE) ×3 IMPLANT
SUT NOVA NAB GS-21 0 18 T12 DT (SUTURE) ×3 IMPLANT
TOWEL OR 17X24 6PK STRL BLUE (TOWEL DISPOSABLE) ×3 IMPLANT
TOWEL OR 17X26 10 PK STRL BLUE (TOWEL DISPOSABLE) ×3 IMPLANT
TRAY LAPAROSCOPIC MC (CUSTOM PROCEDURE TRAY) ×3 IMPLANT
TROCAR XCEL BLUNT TIP 100MML (ENDOMECHANICALS) ×3 IMPLANT
TROCAR XCEL NON-BLD 11X100MML (ENDOMECHANICALS) ×3 IMPLANT
TROCAR XCEL NON-BLD 5MMX100MML (ENDOMECHANICALS) ×3 IMPLANT
TUBE CONNECTING 12X1/4 (SUCTIONS) ×3 IMPLANT
TUBING INSUFFLATION (TUBING) ×3 IMPLANT
YANKAUER SUCT BULB TIP NO VENT (SUCTIONS) ×3 IMPLANT

## 2016-05-07 NOTE — Interval H&P Note (Signed)
History and Physical Interval Note:  05/07/2016 10:05 AM  Devin [name]  has presented today for surgery, with the diagnosis of gallstones  The various methods of treatment have been discussed with the patient and family. After consideration of risks, benefits and other options for treatment, the patient has consented to  Procedure(s): LAPAROSCOPIC CHOLECYSTECTOMY WITH INTRAOPERATIVE CHOLANGIOGRAM POSSIBLE OPEN (N/A) as a surgical intervention .  The patient's history has been reviewed, patient examined, no change in status, stable for surgery.  I have reviewed the patient's chart and labs.  Questions were answered to the patient's satisfaction.     Adin Hector

## 2016-05-07 NOTE — Anesthesia Procedure Notes (Signed)
Procedure Name: Intubation Date/Time: 05/07/2016 10:52 AM Performed by: Garrison Columbus T Pre-anesthesia Checklist: Patient identified, Emergency Drugs available, Suction available and Patient being monitored Patient Re-evaluated:Patient Re-evaluated prior to inductionOxygen Delivery Method: Circle System Utilized Preoxygenation: Pre-oxygenation with 100% oxygen Intubation Type: IV induction Ventilation: Mask ventilation without difficulty Laryngoscope Size: Mac and 4 Grade View: Grade II Tube type: Oral Tube size: 7.5 mm Number of attempts: 1 Airway Equipment and Method: Stylet and Oral airway Placement Confirmation: ETT inserted through vocal cords under direct vision,  positive ETCO2 and breath sounds checked- equal and bilateral Secured at: 23 cm Tube secured with: Tape Dental Injury: Teeth and Oropharynx as per pre-operative assessment  Comments: Intubation by Delphia Grates, CRNA

## 2016-05-07 NOTE — Discharge Instructions (Signed)
[facility] ______[facility] °LAPAROSCOPIC SURGERY: POST OP INSTRUCTIONS °Always review your discharge instruction sheet given to you by the facility where your surgery was performed. °IF YOU HAVE DISABILITY OR FAMILY LEAVE FORMS, YOU MUST BRING THEM TO THE OFFICE FOR PROCESSING.   °DO NOT GIVE THEM TO YOUR DOCTOR. ° °1. A prescription for pain medication may be given to you upon discharge.  Take your pain medication as prescribed, if needed.  If narcotic pain medicine is not needed, then you may take acetaminophen (Tylenol) or ibuprofen (Advil) as needed. °2. Take your usually prescribed medications unless otherwise directed. °3. If you need a refill on your pain medication, please contact your pharmacy.  They will contact our office to request authorization. Prescriptions will not be filled after 5pm or on week-ends. °4. You should follow a light diet the first few days after arrival home, such as soup and crackers, etc.  Be sure to include lots of fluids daily. °5. Most patients will experience some swelling and bruising in the area of the incisions.  Ice packs will help.  Swelling and bruising can take several days to resolve.  °6. It is common to experience some constipation if taking pain medication after surgery.  Increasing fluid intake and taking a stool softener (such as Colace) will usually help or prevent this problem from occurring.  A mild laxative (Milk of Magnesia or Miralax) should be taken according to package instructions if there are no bowel movements after 48 hours. °7. Unless discharge instructions indicate otherwise, you may remove your bandages 24-48 hours after surgery, and you may shower at that time.  You may have steri-strips (small skin tapes) in place directly over the incision.  These strips should be left on the skin for 7-10 days.  If your surgeon used skin glue on the incision, you may shower in 24 hours.  The glue will flake off over the next 2-3 weeks.  Any sutures or  staples will be removed at the office during your follow-up visit. °8. ACTIVITIES:  You may resume regular (light) daily activities beginning the next day--such as daily self-care, walking, climbing stairs--gradually increasing activities as tolerated.  You may have sexual intercourse when it is comfortable.  Refrain from any heavy lifting or straining until approved by your doctor. °a. You may drive when you are no longer taking prescription pain medication, you can comfortably wear a seatbelt, and you can safely maneuver your car and apply brakes. °b. RETURN TO WORK:  __________________________________________________________ °9. You should see your doctor in the office for a follow-up appointment approximately 2-3 weeks after your surgery.  Make sure that you call for this appointment within a day or two after you arrive home to insure a convenient appointment time. °10. OTHER INSTRUCTIONS: __________________________________________________________________________________________________________________________ __________________________________________________________________________________________________________________________ °WHEN TO CALL YOUR DOCTOR: °1. Fever over 101.0 °2. Inability to urinate °3. Continued bleeding from incision. °4. Increased pain, redness, or drainage from the incision. °5. Increasing abdominal pain ° °The clinic staff is available to answer your questions during regular business hours.  Please don’t hesitate to call and ask to speak to one of the nurses for clinical concerns.  If you have a medical emergency, go to the nearest emergency room or call 911.  A surgeon from [facility] is always on call at the hospital. °[address], [address], [place], [place]  [address] ? [address] [address], [place], [place]   [address] °[phone] ? [phone] ? FAX [phone] °Web site:   [email or web address] °

## 2016-05-07 NOTE — Transfer of Care (Signed)
Immediate Anesthesia Transfer of Care Note  Patient: Devin [name]  Procedure(s) Performed: Procedure(s): LAPAROSCOPIC CHOLECYSTECTOMY (N/A)  Patient Location: PACU  Anesthesia Type:General  Level of Consciousness: awake, alert  and oriented  Airway & Oxygen Therapy: Patient Spontanous Breathing  Post-op Assessment: Report given to RN, Post -op Vital signs reviewed and stable and Patient moving all extremities  Post vital signs: Reviewed and stable  Last Vitals:  Vitals:   05/07/16 1013  BP: (!) 150/59  Pulse: 95  Resp: 18  Temp: 37 C    Last Pain:  Vitals:   05/07/16 1013  TempSrc: Oral         Complications: No apparent anesthesia complications

## 2016-05-07 NOTE — Anesthesia Preprocedure Evaluation (Signed)
Anesthesia Evaluation  Patient identified by MRN, date of birth, ID band Patient awake    Reviewed: Allergy & Precautions, NPO status , Patient's Chart, lab work & pertinent test results  History of Anesthesia Complications Negative for: history of anesthetic complications  Airway Mallampati: III  TM Distance: >3 FB Neck ROM: Full    Dental  (+) Teeth Intact, Caps   Pulmonary neg shortness of breath, neg sleep apnea, neg COPD, neg recent URI, former smoker, neg PE   breath sounds clear to auscultation       Cardiovascular hypertension, Pt. on medications (-) angina(-) CHF  Rhythm:Regular     Neuro/Psych negative neurological ROS  negative psych ROS   GI/Hepatic negative GI ROS, Neg liver ROS,   Endo/Other  diabetes, Type 2, Insulin DependentHypothyroidism   Renal/GU CRFRenal disease     Musculoskeletal   Abdominal   Peds  Hematology  (+) anemia ,   Anesthesia Other Findings   Reproductive/Obstetrics                             Anesthesia Physical Anesthesia Plan  ASA: III  Anesthesia Plan: General   Post-op Pain Management:    Induction: Intravenous  Airway Management Planned: Oral ETT  Additional Equipment: None  Intra-op Plan:   Post-operative Plan: Extubation in OR  Informed Consent: I have reviewed the patients History and Physical, chart, labs and discussed the procedure including the risks, benefits and alternatives for the proposed anesthesia with the patient or authorized representative who has indicated his/her understanding and acceptance.   Dental advisory given  Plan Discussed with: CRNA and Surgeon  Anesthesia Plan Comments:         Anesthesia Quick Evaluation

## 2016-05-07 NOTE — Progress Notes (Signed)
Gerald Stabs, CRNA notified that patient has not taken atenolol

## 2016-05-07 NOTE — Op Note (Addendum)
Patient Name:           Devin [name]   Date of Surgery:        05/07/2016  Pre op Diagnosis:      Chronic cholecystitis with cholelithiasis  Post op Diagnosis:    Chronic cholecystitis with cholelithiasis, extensive intra-abdominal adhesions  Procedure:                 Laparoscopic lysis of adhesions requiring 60 minutes operating time                                     Laparoscopic cholecystectomy  Surgeon:                     Edsel Petrin. Dalbert Batman, M.D., FACS  Assistant:                    Armandina Gemma, M.D.   Indication for Assistant: Complex adhesions.  Complex exposure.  Surgeon assist necessary to reduce incidence of intraoperative and postoperative complications.  Operative Indications:  This is a 75 year old man who returns to discuss whether or not to have cholecystectomy. His gastroenterologist is Dr. Earlean Shawl. His neurosurgeon is Dr. Ronnald Ramp. His cardiologist is Dr. Wynonia Lawman. He is in the process of reestablishing with a PCP. His wife sees Dr. Dagmar Hait.      I saw him on March 18, 2016. He had a compression fracture of his back in July 2016 and again in August 2017 he reinjured his back at another compression fracture of T9 and L1 and he had a second back operation by Dr. Ronnald Ramp. Since that time he's had burning pain in his right upper quadrant. His appetite is diminished. The pain is intermittent and usually in the morning. Dr. Ronnald Ramp put him on gabapentin to see if this was a neuropathic pain, but that did not help. No history of peptic ulcer disease. No change in bowel habits. I gave him Protonix 2 weeks ago and he has taken the medication but it does not alter his symptoms. History of partial obstruction Crohn's disease and underwent ileocolic resection and Meckel's diverticulectomy by Dr. Alphonsa Overall in 2008. A year later Dr. Lucia Gaskins performed a laparoscopic ventral hernia repair with parietex inlay mesh. He did well following that. In the distant past he had a radical  prostatectomy for prostate cancer 2005.       CT scan shows gallstones and nonobstructing kidney stones. Ultrasound shows multiple stones, largest being 1.6 cm. Common duct not dilated. Gallbladder wall  mildly thickened at 5 mm. Fatty infiltration of liver. Recent hepatobiliary scan shows good uptake and excretion down the common duct into the gallbladder and the duodenum but the ejection fraction is low at 26%, normal being greater than 33% using the oral agent. This did not reproduce his symptoms.     Comorbidities include insulin-dependent diabetes, total thyroidectomy for cancer, 2 back surgeries in the last 12 months, colonoscopy 3 months ago, left thoracotomy for stage I lung cancer, laparoscopic ventral hernia repair, ileocolic resection for Crohn's disease, radical prostatectomy. Former smoker. 30 pack history. Family history negative for cancer syndromes.      I had a very long talk with the patient and his wife. I told him that cholecystectomy seemed appropriate to offer, but gave him no more than 80% chance that his symptoms would resolve. His symptoms may be a combination of biliary tract  and neuropathic. He knows that he is at increased risk for surgery because of multiple prior surgeries and adhesions. Offered second surgical  opinion. I offered referral to gastroenterology. He stated he wanted to go ahead with this gallbladder surgery, understanding that his symptoms may or may not resolve. He knows that he clearly has a diseased gallbladder.      He'll be scheduled for laparoscopic cholecystectomy with IOC and, possible open cholecystectomy. I discussed the indications, details, technique, and numerous risk of the surgery with him and his wife. He is aware of the risk of bleeding, infection, conversion to open laparotomy, wound healing problems such as hernia, injury to adjacent organs requiring major reconstructive surgery, bile leak, failure to resolve his symptoms. He  understands all these issues. All of his questions were answered. He agrees with this plan.   Operative Findings:       There were extensive intra-abdominal adhesions.  The entire omentum was plastered to every part of the hernia mesh.  The colon and small bowel were not involved.  I slowly took down the adhesions and finally after 60 minutes had good exposure of the liver and gallbladder.  Four-quadrant inspection following the procedure revealed no active bleeding and no evidence of intestinal injury from the 5 trochars.  The gallbladder was thick-walled, fragile, chronically inflamed.  The anatomy of the cystic duct and cystic artery were conventional.  We had a large critical view and window behind the cystic duct and cystic artery.  Procedure in Detail:          Following the induction of general endotracheal anesthesia the patient's abdomen was prepped and draped in a sterile fashion.  Intravenous antibiotics were given.  Surgical timeout was performed.  0.5% Marcaine with epinephrine was used as local infiltration anesthetic.  A 5 mm trocar was placed in the left subcostal region.  Optical entry was uneventful but I could see that I was below the omentum.  I could see that I was at the edge of the mesh.  There was no bleeding or intestinal injury noted initially or at the completion of the case.  I could not dissect the adhesions with the trocar and the camera.  I was able to go to the lower midline and around the adhesions and place a 5 mm trocar in the right lower quadrant under direct vision.  I could then see some areas of mesh in the upper midline and placed a 5 mm trocar through that.  I slowly took the adhesions down.  I cleared all the adhesions off of the midline and right upper quadrant and right paracolic areas.  I placed another 5 mm trocar in the right upper quadrant and a 11 mm trocar in the midline above the umbilicus.  These were all under direct vision.     The gallbladder was  elevated.  I used the fifth trocar to retract the left lobe of the liver off of the gallbladder.  We incised the peritoneum of the neck of the gallbladder.  We carefully dissected out the cystic duct and cystic artery creating a large window behind both structures.  We could see a critical view and then we secured both structures with multiple medical clips and divided them.  The gallbladder was dissected from his bed with electrocautery placed in a specimen bag and removed.  We had to stretch the trocar site just above the umbilicus a little bit.  Operative field was copiously irrigated.  We had spilled  a little bile from the gallbladder but no stones.  At the completion of the case we saw no bleeding and no bile leak whatsoever.  Using a suture passer I closed the 11 mm trocar site through the center of the mesh just above the umbilicus with 0 Prolene sutures.  All the skin incisions were closed with subcuticular sutures of 4-0 Monocryl and Dermabond.  The patient tolerated the procedure well was taken to PACU in stable condition.  EBL 40 mL.  Counts correct.  Complications none.     Edsel Petrin. Dalbert Batman, M.D., FACS General and Minimally Invasive Surgery Breast and Colorectal Surgery  05/07/2016 12:34 PM

## 2016-05-08 ENCOUNTER — Encounter (HOSPITAL_COMMUNITY): Payer: Self-pay | Admitting: General Surgery

## 2016-05-08 DIAGNOSIS — E1142 Type 2 diabetes mellitus with diabetic polyneuropathy: Secondary | ICD-10-CM | POA: Diagnosis not present

## 2016-05-08 DIAGNOSIS — Z8546 Personal history of malignant neoplasm of prostate: Secondary | ICD-10-CM | POA: Diagnosis not present

## 2016-05-08 DIAGNOSIS — K5090 Crohn's disease, unspecified, without complications: Secondary | ICD-10-CM | POA: Diagnosis not present

## 2016-05-08 DIAGNOSIS — K660 Peritoneal adhesions (postprocedural) (postinfection): Secondary | ICD-10-CM | POA: Diagnosis not present

## 2016-05-08 DIAGNOSIS — Z794 Long term (current) use of insulin: Secondary | ICD-10-CM | POA: Diagnosis not present

## 2016-05-08 DIAGNOSIS — K8010 Calculus of gallbladder with chronic cholecystitis without obstruction: Secondary | ICD-10-CM | POA: Diagnosis not present

## 2016-05-08 LAB — GLUCOSE, CAPILLARY
Glucose-Capillary: 179 mg/dL — ABNORMAL HIGH (ref 65–99)
Glucose-Capillary: 160 mg/dL — ABNORMAL HIGH (ref 65–99)

## 2016-05-08 NOTE — Discharge Summary (Signed)
Patient ID: Devin [name] [id number] 75 y.o. January 20, 1941  Admit date: 05/07/2016  Discharge date and time: 05/08/2016  Admitting Physician: Devin [name]  Discharge Physician: Devin [name]  Admission Diagnoses: gallstones  Discharge Diagnoses: Chronic cholecystitis with cholelithiasis                                         Insulin-dependent diabetes mellitus                                         History lumbar back surgery                                         Crohn's disease in remission                                          History laparoscopic ventral hernia repair with mesh                                          History prostatectomy for prostate cancer                                          History adenocarcinoma of left lung                                          Polyneuropathy, diabetic                                           History thyroid cancer  Operations: Procedure(s): LAPAROSCOPIC CHOLECYSTECTOMY LAPAROSCOPIC LYSIS OF ADHESIONS TIMES ONE HOUR  Admission Condition: good  Discharged Condition: good  Indication for Admission: This is a 75 year old man who returns to discuss whether or not to have cholecystectomy. His gastroenterologist is Dr. Earlean [name]. His neurosurgeon is Dr. Ronnald [name]. His cardiologist is Dr. Wynonia [name]. He is in the process of reestablishing with a PCP. His wife sees Dr. Dagmar [name].      I saw him on March 18, 2016. He had a compression fracture of his back in July 2016 and again in August 2017 he reinjured his back at another compression fracture of T9 and L1 and he had a second back operation by Dr. Ronnald [name]. Since that time he's had burning pain in his right upper quadrant. His appetite is diminished. The pain is intermittent and usually in the morning. Dr. Ronnald [name] put him on gabapentin to see if this was a neuropathic pain, but that did not help. No history of peptic ulcer disease. No change in bowel habits. I gave him Protonix  2 weeks ago and he has taken the medication but it does not alter his symptoms. History of partial obstruction Crohn's disease  and underwent ileocolic resection and Meckel's diverticulectomy by Dr. Alphonsa [name] in 2008. A year later Dr. Lucia [name] performed a laparoscopic ventral hernia repair with parietex inlay mesh. He did well following that. In the distant past he had a radical prostatectomy for prostate cancer 2005.       CT scan shows gallstones and nonobstructing kidney stones. Ultrasound shows multiple stones, largest being 1.6 cm. Common duct not dilated. Gallbladder wall  mildly thickened at 5 mm. Fatty infiltration of liver. Recent hepatobiliary scan shows good uptake and excretion down the common duct into the gallbladder and the duodenum but the ejection fraction is low at 26%, normal being greater than 33% using the oral agent. This did not reproduce his symptoms.     Comorbidities include insulin-dependent diabetes, total thyroidectomy for cancer, 2 back surgeries in the last 12 months, colonoscopy 3 months ago, left thoracotomy for stage I lung cancer, laparoscopic ventral hernia repair, ileocolic resection for Crohn's disease, radical prostatectomy. Former smoker. 30 pack history. Family history negative for cancer syndromes.      I had a very long talk with the patient and his wife. I told him that cholecystectomy seemed appropriate to offer, but gave him no more than 80% chance that his symptoms would resolve. His symptoms may be a combination of biliary tract and neuropathic. He knows that he is at increased risk for surgery because of multiple prior surgeries and adhesions. Offered second surgical  opinion. I offered referral to gastroenterology. He stated he wanted to go ahead with this gallbladder surgery, understanding that his symptoms may or may not resolve. He knows that he clearly has a diseased gallbladder.      He is scheduled for laparoscopic cholecystectomy  with IOC and, possible open cholecystectomy. I discussed the indications, details, technique, and numerous risk of the surgery with him and his wife. He is aware of the risk of bleeding, infection, conversion to open laparotomy, wound healing problems such as hernia, injury to adjacent organs requiring major reconstructive surgery, bile leak, failure to resolve his symptoms. He understands all these issues. All of his questions were answered. He agrees with this plan.  Hospital Course: On the day of admission the patient was taken to the operating room and underwent extensive laparoscopic lysis of adhesions, laparoscopic cholecystectomy.     Operative findings were that there were extensive intra-abdominal adhesions.  The entire omentum was plastered to every part of the hernia mesh.  The colon and small bowel were not involved.  I slowly took down the adhesions and finally after 60 minutes had good exposure of the liver and gallbladder.  Four-quadrant inspection following the procedure revealed no active bleeding and no evidence of intestinal injury from the 5 trochars.  The gallbladder was thick-walled, fragile, chronically inflamed.  The anatomy of the cystic duct and cystic artery were conventional.  We had a large critical view and window behind the cystic duct and cystic artery.    Because of the length of the surgery and lysis of adhesions was observed overnight and did very well.  On postop day 1 he had no pain.  Felt well and wanted to go home.  He was ambulating independently.  Voiding without difficulty and tolerating diet.  Abdominal exam revealed the abdomen was soft nondistended nontender in all wounds look fine.  Operative findings were discussed.  He declined prescription strength pain medication.  Diet and activities were discussed he will follow-up with me in the office on December 28.  Consults: None  Significant Diagnostic Studies: Lab work, surgical pathology  Treatments: surgery:  Laparoscopic cholecystectomy with extensive lysis of adhesions  Disposition: Home  Patient Instructions:    Medication List    TAKE these medications   acetaminophen 500 MG tablet Commonly known as:  TYLENOL Take 1,000 mg by mouth 2 (two) times daily.   ASACOL HD 800 MG Tbec Generic drug:  Mesalamine Take 1,600 mg by mouth 2 (two) times daily.   ASCENSIA CONTOUR TEST VI Use as directed to test two times a day   BAYER CONTOUR TEST test strip Generic drug:  glucose blood TEST BLOOD SUGAR TWICE DAILY AS DIRECTED.   atenolol 25 MG tablet Commonly known as:  TENORMIN Take 0.5 tablets (12.5 mg total) by mouth daily.   busPIRone 15 MG tablet Commonly known as:  BUSPAR TAKE 1 TABLET(15 MG) BY MOUTH TWICE DAILY   calcium carbonate 1250 (500 Ca) MG chewable tablet Commonly known as:  OS-CAL Chew 1 tablet by mouth 2 (two) times daily.   CENTRUM SILVER tablet Take 1 tablet by mouth daily.   cyclobenzaprine 10 MG tablet Commonly known as:  FLEXERIL Take 1 tablet (10 mg total) by mouth 3 (three) times daily as needed for muscle spasms.   gabapentin 300 MG capsule Commonly known as:  NEURONTIN Take 300 mg by mouth 3 (three) times daily.   HUMALOG KWIKPEN 100 UNIT/ML KiwkPen Generic drug:  insulin lispro INJECT UNDER THE SKIN THREE TIMES DAILY( 25 UNITS, 25 UNITS AND 20 UNITS RESPECTIVELY BEFORE MEALS) What changed:  See the new instructions.   HYDROcodone-acetaminophen 5-325 MG tablet Commonly known as:  NORCO/VICODIN Take 1 tablet by mouth every 6 (six) hours as needed for moderate pain.   Insulin Pen Needle 31G X 8 MM Misc Commonly known as:  B-D ULTRAFINE III SHORT PEN 1 each by Other route 4 (four) times daily.   B-D ULTRAFINE III SHORT PEN 31G X 8 MM Misc Generic drug:  Insulin Pen Needle USE FOUR TIMES DAILY AS DIRECTED   leuprolide 11.25 MG injection Commonly known as:  LUPRON Inject 11.25 mg into the muscle every 6 (six) months.   levothyroxine 175 MCG  tablet Commonly known as:  SYNTHROID, LEVOTHROID TAKE 1 TABLET BY MOUTH EVERY DAY BEFORE BREAKFAST.   lisinopril-hydrochlorothiazide 20-12.5 MG tablet Commonly known as:  PRINZIDE,ZESTORETIC TAKE 1 TABLET BY MOUTH EVERY DAY   ondansetron 4 MG tablet Commonly known as:  ZOFRAN Take 1 tablet (4 mg total) by mouth every 8 (eight) hours as needed for nausea or vomiting.   oxyCODONE-acetaminophen 5-325 MG tablet Commonly known as:  PERCOCET/ROXICET Take 2 tablets by mouth every 4 (four) hours as needed for severe pain.   senna-docusate 8.6-50 MG tablet Commonly known as:  Senokot-S Take 1 tablet by mouth daily.   triamcinolone cream 0.1 % Commonly known as:  KENALOG Apply 1 application topically 2 (two) times daily.       Activity: No sports or heavy lifting for 4 weeks Diet: diabetic diet Wound Care: none needed  Follow-up:  With Dr. Dalbert Batman in 4 weeks.  Signed: Edsel Petrin. Dalbert Batman, M.D., FACS General and minimally invasive surgery Breast and Colorectal Surgery  05/08/2016, 6:15 AM

## 2016-05-08 NOTE — Progress Notes (Signed)
Pt discharged to home.  Discharge instructions explained to pt.  Pt has no questions at the time of discharge. Pt states he has all belongings.  IV removed.  Pt ambulated off unit on his own.

## 2016-05-08 NOTE — Anesthesia Postprocedure Evaluation (Signed)
Anesthesia Post Note  Patient: Devin [name]  Procedure(s) Performed: Procedure(s) (LRB): LAPAROSCOPIC CHOLECYSTECTOMY (N/A) LAPAROSCOPIC LYSIS OF ADHESIONS TIMES ONE HOUR (N/A)  Patient location during evaluation: PACU Anesthesia Type: General Level of consciousness: awake Pain management: pain level controlled Vital Signs Assessment: post-procedure vital signs reviewed and stable Respiratory status: spontaneous breathing Cardiovascular status: stable Postop Assessment: no signs of nausea or vomiting Anesthetic complications: no    Last Vitals:  Vitals:   05/08/16 0606 05/08/16 1027  BP: (!) 144/62 (!) 141/60  Pulse: 79 92  Resp: 18   Temp: 36.9 C     Last Pain:  Vitals:   05/08/16 0812  TempSrc:   PainSc: 2                  Allien Melberg

## 2016-05-17 ENCOUNTER — Other Ambulatory Visit: Payer: Self-pay | Admitting: Physician Assistant

## 2016-05-17 ENCOUNTER — Ambulatory Visit (INDEPENDENT_AMBULATORY_CARE_PROVIDER_SITE_OTHER): Payer: Medicare Other | Admitting: Physician Assistant

## 2016-05-17 VITALS — BP 133/72 | HR 93 | Temp 98.9°F | Resp 18 | Ht 68.5 in | Wt 208.6 lb

## 2016-05-17 DIAGNOSIS — IMO0001 Reserved for inherently not codable concepts without codable children: Secondary | ICD-10-CM

## 2016-05-17 DIAGNOSIS — E890 Postprocedural hypothyroidism: Secondary | ICD-10-CM | POA: Diagnosis not present

## 2016-05-17 DIAGNOSIS — Z794 Long term (current) use of insulin: Secondary | ICD-10-CM | POA: Diagnosis not present

## 2016-05-17 DIAGNOSIS — K5090 Crohn's disease, unspecified, without complications: Secondary | ICD-10-CM | POA: Diagnosis not present

## 2016-05-17 DIAGNOSIS — E1129 Type 2 diabetes mellitus with other diabetic kidney complication: Principal | ICD-10-CM

## 2016-05-17 DIAGNOSIS — I10 Essential (primary) hypertension: Secondary | ICD-10-CM

## 2016-05-17 DIAGNOSIS — F411 Generalized anxiety disorder: Secondary | ICD-10-CM

## 2016-05-17 DIAGNOSIS — E1142 Type 2 diabetes mellitus with diabetic polyneuropathy: Secondary | ICD-10-CM

## 2016-05-17 MED ORDER — INSULIN PEN NEEDLE 31G X 8 MM MISC: 1.0000 | Freq: Four times a day (QID) | 1 refills | Status: DC

## 2016-05-17 MED ORDER — BUSPIRONE HCL 15 MG PO TABS: ORAL_TABLET | ORAL | 11 refills | Status: DC

## 2016-05-17 MED ORDER — ATENOLOL 25 MG PO TABS: 12.5000 mg | ORAL_TABLET | Freq: Every day | ORAL | 1 refills | Status: DC

## 2016-05-17 MED ORDER — INSULIN LISPRO 100 UNIT/ML (KWIKPEN): PEN_INJECTOR | SUBCUTANEOUS | 5 refills | Status: DC

## 2016-05-17 MED ORDER — GLUCOSE BLOOD VI STRP: ORAL_STRIP | 2 refills | Status: DC

## 2016-05-17 MED ORDER — LEVOTHYROXINE SODIUM 175 MCG PO TABS
ORAL_TABLET | ORAL | 1 refills | Status: DC
Start: 2016-05-17 — End: 2018-03-02

## 2016-05-17 MED ORDER — LISINOPRIL-HYDROCHLOROTHIAZIDE 20-12.5 MG PO TABS: 1.0000 | ORAL_TABLET | Freq: Every day | ORAL | 1 refills | Status: DC

## 2016-05-17 NOTE — Patient Instructions (Signed)
     IF you received an x-ray today, you will receive an invoice from [facility]. Please contact [facility] at [phone] with questions or concerns regarding your invoice.   IF you received labwork today, you will receive an invoice from [facility]. Please contact [facility] at [phone] with questions or concerns regarding your invoice.   Our billing staff will not be able to assist you with questions regarding bills from these companies.  You will be contacted with the lab results as soon as they are available. The fastest way to get your results is to activate your My Chart account. Instructions are located on the last page of this paperwork. If you have not heard from us regarding the results in 2 weeks, please contact this office.      

## 2016-05-17 NOTE — Progress Notes (Signed)
Devin [name]  MRN: [id number] DOB: 1940/07/22  Subjective:  Pt presents to clinic for medication refill.  He is in the process of changing PCP because he was let go for unknown reasons from his past PCP - he has an appt in May 2018 with his new PCP but he will run out of his medications.  He gets 3 month supply's on everything expect his insulin.  He has had labs within the last month.  Appt in May with his new PCP - Dr Dagmar Hait  Review of Systems  Patient Active Problem List   Diagnosis Date Noted  . Cholecystitis with cholelithiasis 05/07/2016  . Cholecystitis 03/14/2016  . Abdominal pain 03/05/2016  . Malnutrition of moderate degree 02/10/2016  . Thoracic compression fracture ([place]) 02/08/2016  . Back pain 01/26/2016  . Hip pain 01/26/2016  . S/P lobectomy of lung 03/16/2015  . Adenocarcinoma of left lung, stage 1 ([place]) 03/10/2015  . Diabetic neuropathy ([place])   . Ankylosing spondylitis ([place])   . Obesity (BMI 30-39.9)   . Crohn's disease ([place])   . Lung nodule 02/23/2015  . HIstory of basal cell cancer of face   . History of thyroid cancer   . Postsurgical hypothyroidism   . Gout 12/29/2006  . Essential hypertension 12/29/2006  . Osteoporosis 12/29/2006  . 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 Prostate cancer with recurrence     Current Outpatient Prescriptions on File Prior to Visit  Medication Sig Dispense Refill  . ASACOL HD 800 MG TBEC Take 1,600 mg by mouth 2 (two) times daily.  3  . calcium carbonate (OS-CAL) 1250 (500 CA) MG chewable tablet Chew 1 tablet by mouth 2 (two) times daily.    . Glucose Blood (ASCENSIA CONTOUR TEST VI) Use as directed to test two times a day     . leuprolide (LUPRON) 11.25 MG injection Inject 11.25 mg into the muscle every 6 (six) months.     . Multiple Vitamins-Minerals (CENTRUM SILVER) tablet Take 1 tablet by mouth daily.    Marland Kitchen triamcinolone cream (KENALOG) 0.1 % Apply 1 application topically 2 (two)  times daily. 30 g 0  . acetaminophen (TYLENOL) 500 MG tablet Take 1,000 mg by mouth 2 (two) times daily.     No current facility-administered medications on file prior to visit.     No Known Allergies  Pt patients past, family and social history were reviewed and updated.   Objective:  BP 133/72 (BP Location: Right Arm, Patient Position: Sitting, Cuff Size: Normal)   Pulse 93   Temp 98.9 F (37.2 C) (Oral)   Resp 18   Ht 5' 8.5" (1.74 m)   Wt 208 lb 9.6 oz (94.6 kg)   SpO2 96%   BMI 31.26 kg/m   Physical Exam  Constitutional: He is oriented to person, place, and time and well-developed, well-nourished, and in no distress.  HENT:  Head: Normocephalic and atraumatic.  Right Ear: External ear normal.  Left Ear: External ear normal.  Eyes: Conjunctivae are normal.  Neck: Normal range of motion.  Cardiovascular: Normal rate, regular rhythm and normal heart sounds.   No murmur heard. Pulmonary/Chest: Effort normal and breath sounds normal. He has no wheezes.  Neurological: He is alert and oriented to person, place, and time. Gait normal.  Skin: Skin is warm and dry.  Psychiatric: Mood, memory, affect and judgment normal.    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 (HCC) - Plan: Insulin Pen Needle (B-D ULTRAFINE III SHORT PEN) 31G X 8 MM MISC, insulin lispro (HUMALOG KWIKPEN) 100 UNIT/ML KiwkPen, glucose blood (BAYER CONTOUR TEST) test strip  Diabetic polyneuropathy associated with type 2 diabetes mellitus (HCC)  Anxiety state - Plan: busPIRone (BUSPAR) 15 MG tablet   Labs recently done - he has an appt with his PCP in  May - this is a long time for a DM but this was the 1st available to the patient - I will refill medications until then - the patient is to keep a close watch on his DM and daily sugars and if he has problems prior to his new PCP appt he needs evaluation - 6 months of refills given to patient.  Windell Hummingbird PA-C  [facility] 05/17/2016 5:00 PM

## 2016-05-22 DIAGNOSIS — K5010 Crohn's disease of large intestine without complications: Secondary | ICD-10-CM | POA: Diagnosis not present

## 2016-05-22 DIAGNOSIS — R197 Diarrhea, unspecified: Secondary | ICD-10-CM | POA: Diagnosis not present

## 2016-05-24 ENCOUNTER — Other Ambulatory Visit: Payer: Self-pay

## 2016-05-24 NOTE — Telephone Encounter (Signed)
Fax req from [facility] to validate diabetic supplies Form completed, signed and form faxed back.

## 2016-06-12 ENCOUNTER — Encounter: Payer: Medicare Other | Admitting: Endocrinology

## 2016-06-14 ENCOUNTER — Encounter: Payer: Medicare Other | Admitting: Endocrinology

## 2016-06-18 DIAGNOSIS — H40013 Open angle with borderline findings, low risk, bilateral: Secondary | ICD-10-CM | POA: Diagnosis not present

## 2016-06-18 DIAGNOSIS — E119 Type 2 diabetes mellitus without complications: Secondary | ICD-10-CM | POA: Diagnosis not present

## 2016-06-18 DIAGNOSIS — E113293 Type 2 diabetes mellitus with mild nonproliferative diabetic retinopathy without macular edema, bilateral: Secondary | ICD-10-CM | POA: Diagnosis not present

## 2016-06-18 DIAGNOSIS — H25013 Cortical age-related cataract, bilateral: Secondary | ICD-10-CM | POA: Diagnosis not present

## 2016-06-18 DIAGNOSIS — H2513 Age-related nuclear cataract, bilateral: Secondary | ICD-10-CM | POA: Diagnosis not present

## 2016-07-02 ENCOUNTER — Other Ambulatory Visit: Payer: Self-pay | Admitting: Cardiology

## 2016-07-02 ENCOUNTER — Ambulatory Visit
Admission: RE | Admit: 2016-07-02 | Discharge: 2016-07-02 | Disposition: A | Discharge status: Home / Self Care | Payer: Medicare Other | Source: Ambulatory Visit | Attending: Cardiology | Admitting: Cardiology

## 2016-07-02 DIAGNOSIS — R0600 Dyspnea, unspecified: Secondary | ICD-10-CM | POA: Diagnosis not present

## 2016-07-02 DIAGNOSIS — R0602 Shortness of breath: Secondary | ICD-10-CM

## 2016-07-05 ENCOUNTER — Other Ambulatory Visit: Payer: Self-pay | Admitting: Endocrinology

## 2016-07-15 DIAGNOSIS — R0602 Shortness of breath: Secondary | ICD-10-CM | POA: Diagnosis not present

## 2016-07-15 DIAGNOSIS — R0609 Other forms of dyspnea: Secondary | ICD-10-CM | POA: Diagnosis not present

## 2016-07-16 ENCOUNTER — Ambulatory Visit (HOSPITAL_COMMUNITY)
Admission: RE | Admit: 2016-07-16 | Discharge: 2016-07-16 | Disposition: A | Discharge status: Home / Self Care | Payer: Medicare Other | Source: Ambulatory Visit | Attending: Internal Medicine | Admitting: Internal Medicine

## 2016-07-16 DIAGNOSIS — I10 Essential (primary) hypertension: Secondary | ICD-10-CM | POA: Diagnosis not present

## 2016-07-16 DIAGNOSIS — N200 Calculus of kidney: Secondary | ICD-10-CM | POA: Insufficient documentation

## 2016-07-16 DIAGNOSIS — Z9049 Acquired absence of other specified parts of digestive tract: Secondary | ICD-10-CM | POA: Diagnosis not present

## 2016-07-16 DIAGNOSIS — Z1389 Encounter for screening for other disorder: Secondary | ICD-10-CM | POA: Diagnosis not present

## 2016-07-16 DIAGNOSIS — E1122 Type 2 diabetes mellitus with diabetic chronic kidney disease: Secondary | ICD-10-CM | POA: Diagnosis not present

## 2016-07-16 DIAGNOSIS — Z85118 Personal history of other malignant neoplasm of bronchus and lung: Secondary | ICD-10-CM | POA: Diagnosis not present

## 2016-07-16 DIAGNOSIS — K5090 Crohn's disease, unspecified, without complications: Secondary | ICD-10-CM | POA: Diagnosis not present

## 2016-07-16 DIAGNOSIS — R0609 Other forms of dyspnea: Secondary | ICD-10-CM | POA: Diagnosis not present

## 2016-07-16 DIAGNOSIS — N183 Chronic kidney disease, stage 3 (moderate): Secondary | ICD-10-CM | POA: Diagnosis not present

## 2016-07-16 DIAGNOSIS — E038 Other specified hypothyroidism: Secondary | ICD-10-CM | POA: Diagnosis not present

## 2016-07-16 DIAGNOSIS — C61 Malignant neoplasm of prostate: Secondary | ICD-10-CM | POA: Diagnosis not present

## 2016-07-16 DIAGNOSIS — C3492 Malignant neoplasm of unspecified part of left bronchus or lung: Secondary | ICD-10-CM | POA: Insufficient documentation

## 2016-07-16 DIAGNOSIS — C3490 Malignant neoplasm of unspecified part of unspecified bronchus or lung: Secondary | ICD-10-CM | POA: Diagnosis not present

## 2016-07-16 DIAGNOSIS — E784 Other hyperlipidemia: Secondary | ICD-10-CM | POA: Diagnosis not present

## 2016-07-18 ENCOUNTER — Other Ambulatory Visit: Payer: Self-pay | Admitting: Cardiology

## 2016-07-18 ENCOUNTER — Ambulatory Visit (INDEPENDENT_AMBULATORY_CARE_PROVIDER_SITE_OTHER): Payer: Medicare Other | Admitting: Internal Medicine

## 2016-07-18 DIAGNOSIS — R0600 Dyspnea, unspecified: Secondary | ICD-10-CM | POA: Diagnosis not present

## 2016-07-18 LAB — PULMONARY FUNCTION TEST
DL/VA % pred: 96 %
DL/VA: 4.31 ml/min/mmHg/L
DLCO cor % pred: 59 %
DLCO cor: 17.62 ml/min/mmHg
DLCO unc % pred: 57 %
DLCO unc: 17.16 ml/min/mmHg
FEF 25-75 Post: 1.4 L/sec
FEF 25-75 Pre: 1.55 L/sec
FEF2575-%Change-Post: -9 %
FEF2575-%Pred-Post: 65 %
FEF2575-%Pred-Pre: 73 %
FEV1-%Change-Post: -6 %
FEV1-%Pred-Post: 67 %
FEV1-%Pred-Pre: 72 %
FEV1-Post: 1.95 L
FEV1-Pre: 2.09 L
FEV1FVC-%Change-Post: -2 %
FEV1FVC-%Pred-Pre: 106 %
FEV6-%Change-Post: -4 %
FEV6-%Pred-Post: 69 %
FEV6-%Pred-Pre: 72 %
FEV6-Post: 2.56 L
FEV6-Pre: 2.68 L
FEV6FVC-%Change-Post: 0 %
FEV6FVC-%Pred-Post: 106 %
FEV6FVC-%Pred-Pre: 106 %
FVC-%Change-Post: -4 %
FVC-%Pred-Post: 64 %
FVC-%Pred-Pre: 67 %
FVC-Post: 2.57 L
FVC-Pre: 2.68 L
Post FEV1/FVC ratio: 76 %
Post FEV6/FVC ratio: 100 %
Pre FEV1/FVC ratio: 78 %
Pre FEV6/FVC Ratio: 100 %
RV % pred: 106 %
RV: 2.57 L
TLC % pred: 76 %
TLC: 5.08 L

## 2016-07-25 DIAGNOSIS — E1121 Type 2 diabetes mellitus with diabetic nephropathy: Secondary | ICD-10-CM | POA: Diagnosis not present

## 2016-07-25 DIAGNOSIS — R0602 Shortness of breath: Secondary | ICD-10-CM | POA: Diagnosis not present

## 2016-07-25 DIAGNOSIS — E785 Hyperlipidemia, unspecified: Secondary | ICD-10-CM | POA: Diagnosis not present

## 2016-08-01 DIAGNOSIS — E1121 Type 2 diabetes mellitus with diabetic nephropathy: Secondary | ICD-10-CM | POA: Diagnosis not present

## 2016-08-01 DIAGNOSIS — N183 Chronic kidney disease, stage 3 (moderate): Secondary | ICD-10-CM | POA: Diagnosis not present

## 2016-08-01 DIAGNOSIS — R0602 Shortness of breath: Secondary | ICD-10-CM | POA: Diagnosis not present

## 2016-08-01 DIAGNOSIS — I10 Essential (primary) hypertension: Secondary | ICD-10-CM | POA: Diagnosis not present

## 2016-08-14 ENCOUNTER — Other Ambulatory Visit (HOSPITAL_BASED_OUTPATIENT_CLINIC_OR_DEPARTMENT_OTHER): Payer: Medicare Other

## 2016-08-14 DIAGNOSIS — C3492 Malignant neoplasm of unspecified part of left bronchus or lung: Secondary | ICD-10-CM

## 2016-08-14 DIAGNOSIS — C3412 Malignant neoplasm of upper lobe, left bronchus or lung: Secondary | ICD-10-CM | POA: Diagnosis present

## 2016-08-14 LAB — CBC WITH DIFFERENTIAL/PLATELET
BASO%: 0.2 % (ref 0.0–2.0)
Basophils Absolute: 0 10*3/uL (ref 0.0–0.1)
EOS%: 2 % (ref 0.0–7.0)
Eosinophils Absolute: 0.2 10*3/uL (ref 0.0–0.5)
HCT: 36.8 % — ABNORMAL LOW (ref 38.4–49.9)
HGB: 12.2 g/dL — ABNORMAL LOW (ref 13.0–17.1)
LYMPH%: 23.2 % (ref 14.0–49.0)
MCH: 31 pg (ref 27.2–33.4)
MCHC: 33.2 g/dL (ref 32.0–36.0)
MCV: 93.4 fL (ref 79.3–98.0)
MONO#: 0.7 10*3/uL (ref 0.1–0.9)
MONO%: 6.6 % (ref 0.0–14.0)
NEUT#: 7.7 10*3/uL — ABNORMAL HIGH (ref 1.5–6.5)
NEUT%: 68 % (ref 39.0–75.0)
Platelets: 233 10*3/uL (ref 140–400)
RBC: 3.94 10*6/uL — ABNORMAL LOW (ref 4.20–5.82)
RDW: 14.8 % — ABNORMAL HIGH (ref 11.0–14.6)
WBC: 11.3 10*3/uL — ABNORMAL HIGH (ref 4.0–10.3)
lymph#: 2.6 10*3/uL (ref 0.9–3.3)

## 2016-08-14 LAB — COMPREHENSIVE METABOLIC PANEL
ALT: 37 U/L (ref 0–55)
AST: 35 U/L — ABNORMAL HIGH (ref 5–34)
Albumin: 3.9 g/dL (ref 3.5–5.0)
Alkaline Phosphatase: 119 U/L (ref 40–150)
Anion Gap: 9 mEq/L (ref 3–11)
BUN: 31.7 mg/dL — ABNORMAL HIGH (ref 7.0–26.0)
CO2: 22 mEq/L (ref 22–29)
Calcium: 9.9 mg/dL (ref 8.4–10.4)
Chloride: 111 mEq/L — ABNORMAL HIGH (ref 98–109)
Creatinine: 1.5 mg/dL — ABNORMAL HIGH (ref 0.7–1.3)
EGFR: 44 mL/min/{1.73_m2} — ABNORMAL LOW (ref >90)
Glucose: 90 mg/dl (ref 70–140)
Potassium: 4.6 mEq/L (ref 3.5–5.1)
Sodium: 141 mEq/L (ref 136–145)
Total Bilirubin: 0.31 mg/dL (ref 0.20–1.20)
Total Protein: 7 g/dL (ref 6.4–8.3)

## 2016-08-14 NOTE — Progress Notes (Signed)
PFT done.  

## 2016-08-21 ENCOUNTER — Encounter: Payer: Self-pay | Admitting: Internal Medicine

## 2016-08-21 ENCOUNTER — Telehealth: Payer: Self-pay | Admitting: Internal Medicine

## 2016-08-21 ENCOUNTER — Ambulatory Visit (HOSPITAL_BASED_OUTPATIENT_CLINIC_OR_DEPARTMENT_OTHER): Payer: Medicare Other | Admitting: Internal Medicine

## 2016-08-21 VITALS — BP 124/98 | HR 86 | Temp 98.6°F | Resp 17 | Ht 68.5 in | Wt 203.7 lb

## 2016-08-21 DIAGNOSIS — C3412 Malignant neoplasm of upper lobe, left bronchus or lung: Secondary | ICD-10-CM

## 2016-08-21 DIAGNOSIS — C3492 Malignant neoplasm of unspecified part of left bronchus or lung: Secondary | ICD-10-CM

## 2016-08-21 NOTE — Progress Notes (Signed)
[place] [phone]   [phone]  OFFICE PROGRESS NOTE  Tivis Ringer, MD [place] [address]  DIAGNOSIS: Stage IA (T1b, N0, M0) non-small cell lung cancer, adenocarcinoma presenting with left upper lobe pulmonary nodule diagnosed in September 2016.  PRIOR THERAPY: Status post left VATS with left upper lobectomy and mediastinal lymph node dissection under the care of Dr. Prescott Gum on 03/16/2015.  CURRENT THERAPY: Observation.  INTERVAL HISTORY: Devin [name] 75 y.o. male came to the clinic today for six-month follow-up visit. The patient is feeling fine today with no specific complaints. He denied having any chest pain, shortness of breath, cough or hemoptysis. He has no fever or chills. He denied having any significant weight loss or night sweats. He had repeat CT scan of the chest performed recently and he is here for evaluation and discussion of his scan results.  MEDICAL HISTORY: Past Medical History:  Diagnosis Date  . Adenocarcinoma of left lung, stage 1 ([place]) 03/10/2015  . Ankylosing spondylitis ([place])    Diagnosed during lumbar fracture summer of 2016    . Crohn's disease ([place])   . Gout   . HIstory of basal cell cancer of face    THYROID CA HX  . Hypertension   . Hypocalcemia 04/13/2007  . Hypothyroidism   . Impotence   . 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 Obesity (BMI 30-39.9)   . Osteoporosis    Pt completed 5 years of fosamax in 2013     . Prostate cancer with recurrence    Treated with prostatectomy with recurrence 2012 with Lupron treatment now him   . Psoriasis   . Thyroid cancer ([place]) 1999   Treated with RAI and total thyroidectomy   . Type II diabetes mellitus ([facility])     ALLERGIES:  has No Known Allergies.  MEDICATIONS:  Current Outpatient Prescriptions  Medication Sig Dispense Refill  . acetaminophen (TYLENOL) 500 MG tablet Take 1,000 mg by mouth 2 (two)  times daily.    . ASACOL HD 800 MG TBEC Take 1,600 mg by mouth 2 (two) times daily.  3  . atenolol (TENORMIN) 25 MG tablet Take 0.5 tablets (12.5 mg total) by mouth daily. 45 tablet 1  . busPIRone (BUSPAR) 15 MG tablet TAKE 1 TABLET(15 MG) BY MOUTH TWICE DAILY 180 tablet 11  . calcium carbonate (OS-CAL) 1250 (500 CA) MG chewable tablet Chew 1 tablet by mouth 2 (two) times daily.    . Glucose Blood (ASCENSIA CONTOUR TEST VI) Use as directed to test two times a day     . glucose blood (BAYER CONTOUR TEST) test strip TEST BLOOD SUGAR TWICE DAILY AS DIRECTED. 200 each 2  . insulin lispro (HUMALOG KWIKPEN) 100 UNIT/ML KiwkPen INJECT UNDER THE SKIN THREE TIMES DAILY( 25 UNITS, 25 UNITS AND 20 UNITS RESPECTIVELY BEFORE MEALS) 30 mL 5  . Insulin Pen Needle (B-D ULTRAFINE III SHORT PEN) 31G X 8 MM MISC 1 each by Other route 4 (four) times daily. 400 each 1  . leuprolide (LUPRON) 11.25 MG injection Inject 11.25 mg into the muscle every 6 (six) months.     . levothyroxine (SYNTHROID, LEVOTHROID) 175 MCG tablet TAKE 1 TABLET BY MOUTH EVERY DAY BEFORE BREAKFAST. 90 tablet 1  . lisinopril-hydrochlorothiazide (PRINZIDE,ZESTORETIC) 20-12.5 MG tablet Take 1 tablet by mouth daily. 90 tablet 1  . Multiple Vitamins-Minerals (CENTRUM SILVER) tablet Take 1 tablet by mouth daily.    Marland Kitchen  triamcinolone cream (KENALOG) 0.1 % Apply 1 application topically 2 (two) times daily. 30 g 0   No current facility-administered medications for this visit.     SURGICAL HISTORY:  Past Surgical History:  Procedure Laterality Date  . ABDOMINAL EXPLORATION SURGERY     for small bowel obstruction  . APPENDECTOMY  03/2007  . BACK SURGERY    . BASAL CELL CARCINOMA EXCISION  "several"   "head"  . CARDIAC CATHETERIZATION  03/17/2003  . CHOLECYSTECTOMY N/A 05/07/2016   Procedure: LAPAROSCOPIC CHOLECYSTECTOMY;  Surgeon: Fanny Skates, MD;  Location: [facility];  Service: General;  Laterality: N/A;  . COLON SURGERY  03/2007   Resection of  cecum, appendix, terminal ileum (approximately/notes 10/10/2010  . HERNIA REPAIR    . LAPAROSCOPIC CHOLECYSTECTOMY  05/07/2016  . LAPAROSCOPIC LYSIS OF ADHESIONS  05/07/2016  . LAPAROSCOPIC LYSIS OF ADHESIONS N/A 05/07/2016   Procedure: LAPAROSCOPIC LYSIS OF ADHESIONS TIMES ONE HOUR;  Surgeon: Fanny Skates, MD;  Location: [facility];  Service: General;  Laterality: N/A;  . POSTERIOR FUSION THORACIC SPINE  02/08/2016   1. Posterior thoracic arthrodesis T7-T11 utilizing morcellized allograft, 2. Posterior thoracic segmental fixation T7-T11 utilizing nuvasive pedicle screws  . PROSTATECTOMY  06/2001   w/bilateral pelvic lymph nose dissection/notes 10/24/2010  . SPINAL FUSION  12/2014   Open reduction internal fixation of L1 Chance fracture with posterior fusion T10-L4 utilizing morcellized allograft and some local autograft, segmental instrumentation T10-L4 inclusive utilizing nuvasive pedicle screws/notes 12/16/2014  . Stress Cardiolite  02/17/2003  . THOROCOTOMY WITH LOBECTOMY  03/16/2015   Procedure: THOROCOTOMY WITH LOBECTOMY;  Surgeon: Ivin Poot, MD;  Location: [facility];  Service: Thoracic;;  . TONSILLECTOMY    . TOTAL THYROIDECTOMY  1997  . Venous Doppler  05/30/2004  . VENTRAL HERNIA REPAIR  04/14/2008  . VIDEO ASSISTED THORACOSCOPY Left 03/16/2015   Procedure: VIDEO ASSISTED THORACOSCOPY;  Surgeon: Ivin Poot, MD;  Location[facility];  Service: Thoracic;  Laterality: Left;    REVIEW OF SYSTEMS:  A comprehensive review of systems was negative.   PHYSICAL EXAMINATION: General appearance: alert, cooperative and no distress Head: Normocephalic, without obvious abnormality, atraumatic Neck: no adenopathy, no JVD, supple, symmetrical, trachea midline and thyroid not enlarged, symmetric, no tenderness/mass/nodules Lymph nodes: Cervical, supraclavicular, and axillary nodes normal. Resp: clear to auscultation bilaterally Back: symmetric, no curvature. ROM normal. No CVA tenderness. Cardio:  regular rate and rhythm, S1, S2 normal, no murmur, click, rub or gallop GI: soft, non-tender; bowel sounds normal; no masses,  no organomegaly Extremities: extremities normal, atraumatic, no cyanosis or edema  ECOG PERFORMANCE STATUS: 1 - Symptomatic but completely ambulatory  Blood pressure (!) 124/98, pulse 86, temperature 98.6 F (37 C), temperature source Oral, resp. rate 17, height 5' 8.5" (1.74 m), weight 203 lb 11.2 oz (92.4 kg), SpO2 100 %.  LABORATORY DATA: Lab Results  Component Value Date   WBC 11.3 (H) 08/14/2016   HGB 12.2 (L) 08/14/2016   HCT 36.8 (L) 08/14/2016   MCV 93.4 08/14/2016   PLT 233 08/14/2016      Chemistry      Component Value Date/Time   NA 141 08/14/2016 1020   K 4.6 08/14/2016 1020   CL 108 04/29/2016 1112   CO2 22 08/14/2016 1020   BUN 31.7 (H) 08/14/2016 1020   CREATININE 1.5 (H) 08/14/2016 1020      Component Value Date/Time   CALCIUM 9.9 08/14/2016 1020   ALKPHOS 119 08/14/2016 1020   AST 35 (H) 08/14/2016 1020  ALT 37 08/14/2016 1020   BILITOT 0.31 08/14/2016 1020       RADIOGRAPHIC STUDIES: No results found.  ASSESSMENT AND PLAN:  This is a very pleasant 76 years old white male with a stage Ia non-small cell lung cancer diagnosed in September 2016 status post left upper lobectomy and has been observation since that time. The patient is doing fine with no specific complaints. His most recent CT scan of the chest in February 2018 showed no evidence for disease recurrence. I discussed the scan results with the patient today. I recommended for him to continue on observation with repeat CT scan of the chest without contrast in 6 months. For the insufficiency, he will continue his routine follow-up visit and evaluation by his primary care physician. He was advised to call immediately if he has any concerning symptoms in the interval. The patient voices understanding of current disease status and treatment options and is in agreement with  the current care plan. All questions were answered. The patient knows to call the clinic with any problems, questions or concerns. We can certainly see the patient much sooner if necessary. I spent 10 minutes counseling the patient face to face. The total time spent in the appointment was 15 minutes.  Disclaimer: This note was dictated with voice recognition software. Similar sounding words can inadvertently be transcribed and may not be corrected upon review.

## 2016-08-21 NOTE — Telephone Encounter (Signed)
Gave patient avs report and appointments for September. Central radiology will call re scan.

## 2016-09-17 ENCOUNTER — Other Ambulatory Visit: Payer: Self-pay | Admitting: Physician Assistant

## 2016-09-17 DIAGNOSIS — IMO0001 Reserved for inherently not codable concepts without codable children: Secondary | ICD-10-CM

## 2016-09-17 DIAGNOSIS — Z794 Long term (current) use of insulin: Principal | ICD-10-CM

## 2016-09-17 DIAGNOSIS — E1129 Type 2 diabetes mellitus with other diabetic kidney complication: Principal | ICD-10-CM

## 2016-10-10 DIAGNOSIS — C61 Malignant neoplasm of prostate: Secondary | ICD-10-CM | POA: Diagnosis not present

## 2016-10-10 DIAGNOSIS — R0609 Other forms of dyspnea: Secondary | ICD-10-CM | POA: Diagnosis not present

## 2016-10-10 DIAGNOSIS — I10 Essential (primary) hypertension: Secondary | ICD-10-CM | POA: Diagnosis not present

## 2016-10-10 DIAGNOSIS — Z1389 Encounter for screening for other disorder: Secondary | ICD-10-CM | POA: Diagnosis not present

## 2016-10-10 DIAGNOSIS — Z6830 Body mass index (BMI) 30.0-30.9, adult: Secondary | ICD-10-CM | POA: Diagnosis not present

## 2016-10-10 DIAGNOSIS — Z85118 Personal history of other malignant neoplasm of bronchus and lung: Secondary | ICD-10-CM | POA: Diagnosis not present

## 2016-10-10 DIAGNOSIS — K5090 Crohn's disease, unspecified, without complications: Secondary | ICD-10-CM | POA: Diagnosis not present

## 2016-10-10 DIAGNOSIS — F39 Unspecified mood [affective] disorder: Secondary | ICD-10-CM | POA: Diagnosis not present

## 2016-10-10 DIAGNOSIS — E1122 Type 2 diabetes mellitus with diabetic chronic kidney disease: Secondary | ICD-10-CM | POA: Diagnosis not present

## 2016-10-14 ENCOUNTER — Other Ambulatory Visit: Payer: Self-pay | Admitting: Urology

## 2016-10-14 DIAGNOSIS — M810 Age-related osteoporosis without current pathological fracture: Secondary | ICD-10-CM

## 2016-10-23 ENCOUNTER — Ambulatory Visit
Admission: RE | Admit: 2016-10-23 | Discharge: 2016-10-23 | Disposition: A | Discharge status: Home / Self Care | Payer: Medicare Other | Source: Ambulatory Visit | Attending: Urology | Admitting: Urology

## 2016-10-23 DIAGNOSIS — M810 Age-related osteoporosis without current pathological fracture: Secondary | ICD-10-CM

## 2016-10-23 DIAGNOSIS — Z13820 Encounter for screening for osteoporosis: Secondary | ICD-10-CM | POA: Diagnosis not present

## 2016-10-23 DIAGNOSIS — C61 Malignant neoplasm of prostate: Secondary | ICD-10-CM | POA: Diagnosis not present

## 2016-10-30 DIAGNOSIS — C7951 Secondary malignant neoplasm of bone: Secondary | ICD-10-CM | POA: Diagnosis not present

## 2016-10-30 DIAGNOSIS — C61 Malignant neoplasm of prostate: Secondary | ICD-10-CM | POA: Diagnosis not present

## 2017-01-02 ENCOUNTER — Other Ambulatory Visit: Payer: Self-pay | Admitting: Physician Assistant

## 2017-01-02 DIAGNOSIS — I10 Essential (primary) hypertension: Secondary | ICD-10-CM

## 2017-01-03 ENCOUNTER — Other Ambulatory Visit: Payer: Self-pay | Admitting: Physician Assistant

## 2017-01-03 DIAGNOSIS — I10 Essential (primary) hypertension: Secondary | ICD-10-CM

## 2017-01-03 NOTE — Telephone Encounter (Signed)
Please see refill request Pt states that Dr. Elsworth Soho is hi new PCP

## 2017-01-06 NOTE — Telephone Encounter (Signed)
Discussed with Dr. Dagmar Hait from [facility]

## 2017-01-08 NOTE — Telephone Encounter (Incomplete)
Medication refill  Sending this request back to patient pcp Pt states that Dr. Elsworth Soho is his PCP   Its Dr Dagmar Hait from [facility]

## 2017-01-27 ENCOUNTER — Other Ambulatory Visit: Payer: Self-pay | Admitting: Physician Assistant

## 2017-01-27 DIAGNOSIS — E1129 Type 2 diabetes mellitus with other diabetic kidney complication: Principal | ICD-10-CM

## 2017-01-27 DIAGNOSIS — IMO0001 Reserved for inherently not codable concepts without codable children: Secondary | ICD-10-CM

## 2017-01-27 DIAGNOSIS — Z794 Long term (current) use of insulin: Principal | ICD-10-CM

## 2017-01-28 NOTE — Telephone Encounter (Signed)
Pt's PCP was going to be Dr Dagmar Hait - Rf request should go there.

## 2017-01-28 NOTE — Telephone Encounter (Signed)
Please advise 

## 2017-02-13 IMAGING — CT CT OUTSIDE FILMS SPINE
3 series · 11 of 33 positions shown, 13 images · non-contrast
Comparison: none

[Series 2: spine 3.0 · axial · 0.46mm/px · z∈[+1485,+1683]mm · 3 of 108 slices shown, 4 images]
[im 25/108  soft-tissue]
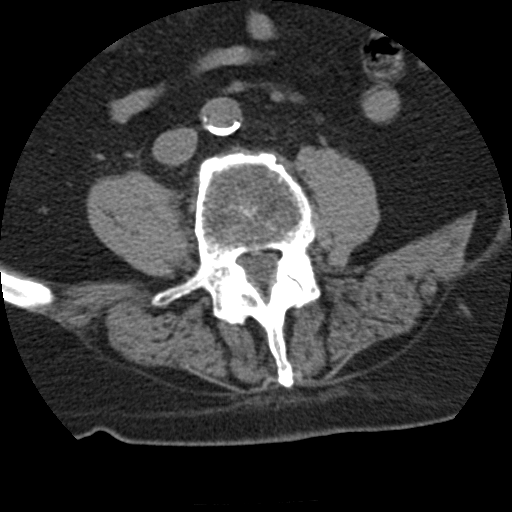
[im 25/108  bone]
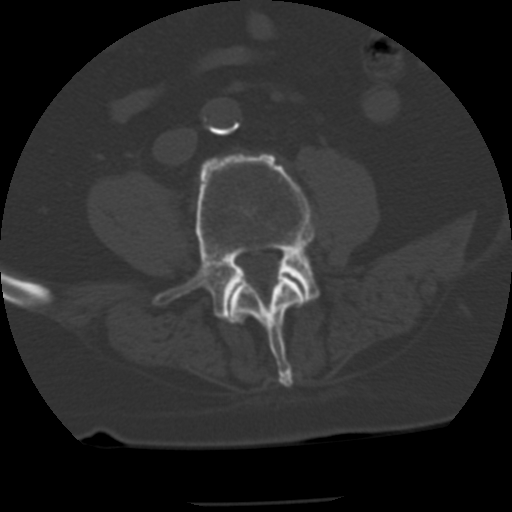
[im 58/108  bone]
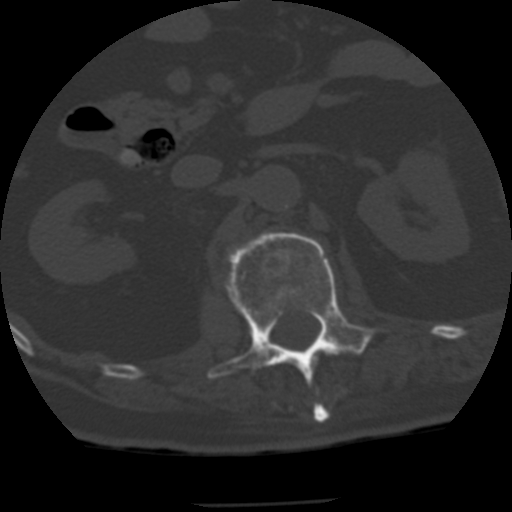
[im 91/108  bone]
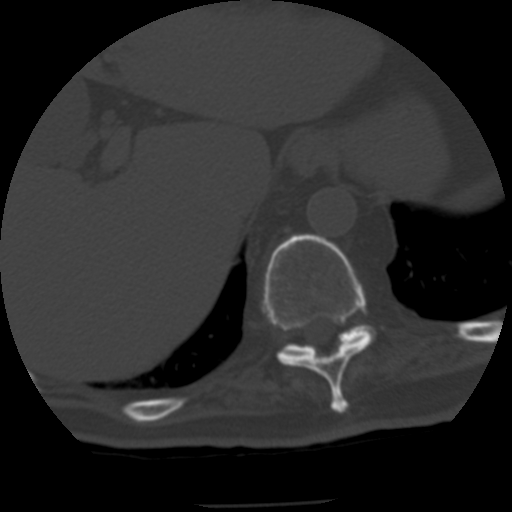

[Series 5: bone 3.0 coronal · coronal · 0.46mm/px · 3 of 73 slices shown]
[im 15/73  bone]
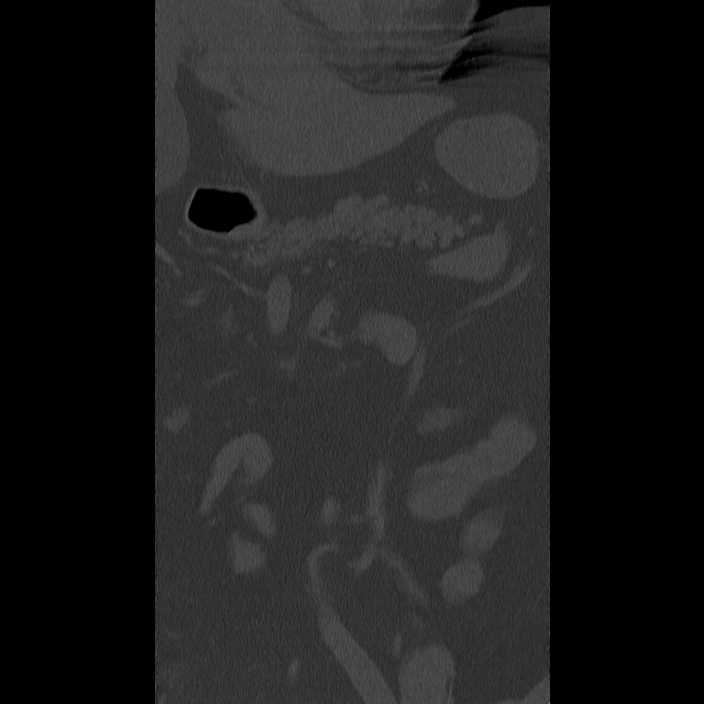
[im 29/73  bone]
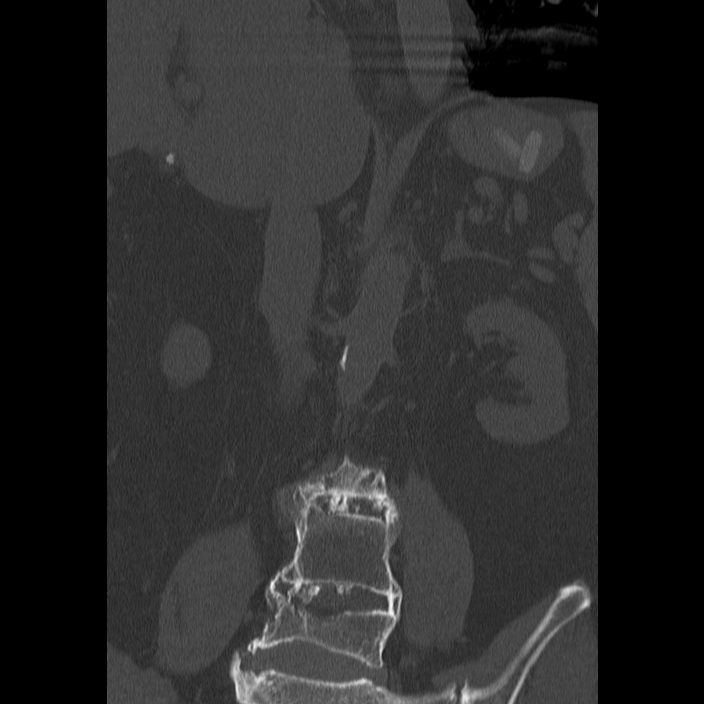
[im 44/73  bone]
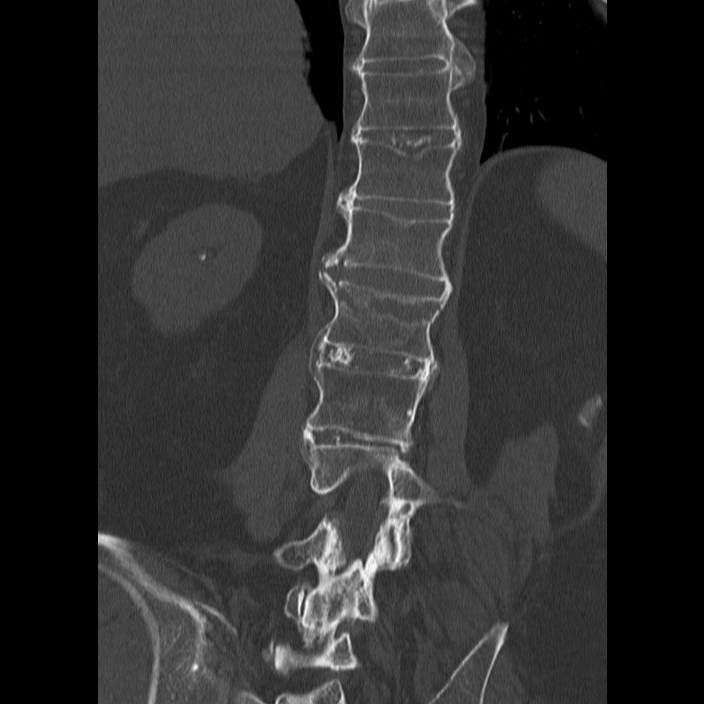

[Series 6: bone 3.0 sagittal · sagittal · 0.46mm/px · 5 of 79 slices shown, 6 images]
[im 27/79  bone]
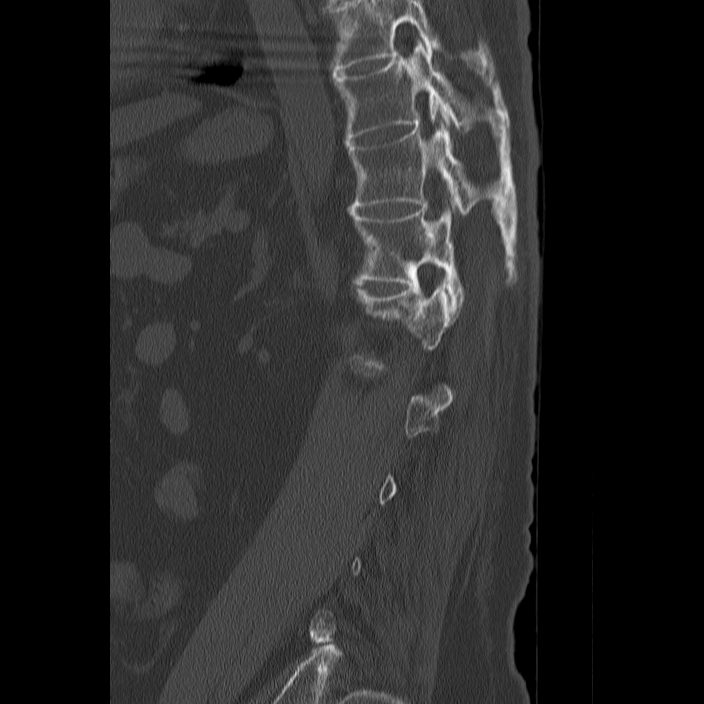
[im 33/79  bone]
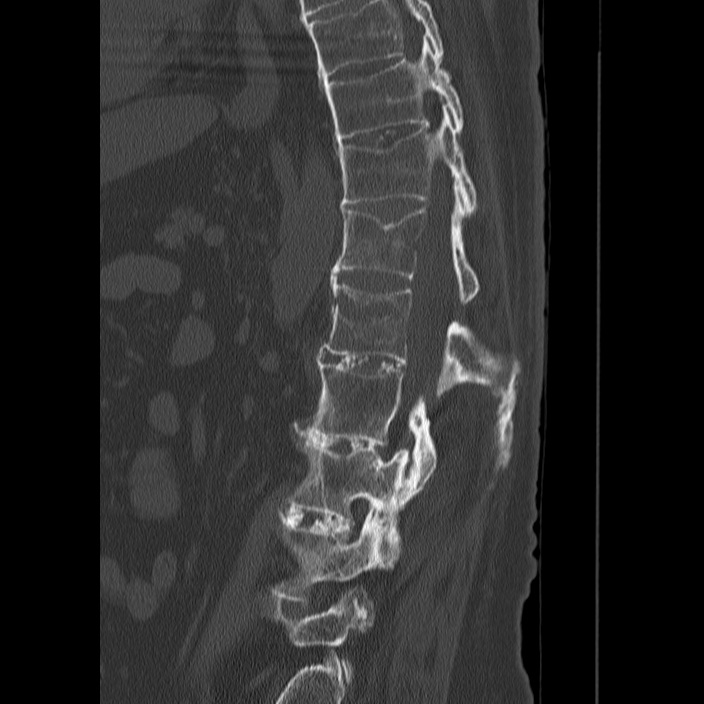
[im 40/79  soft-tissue]
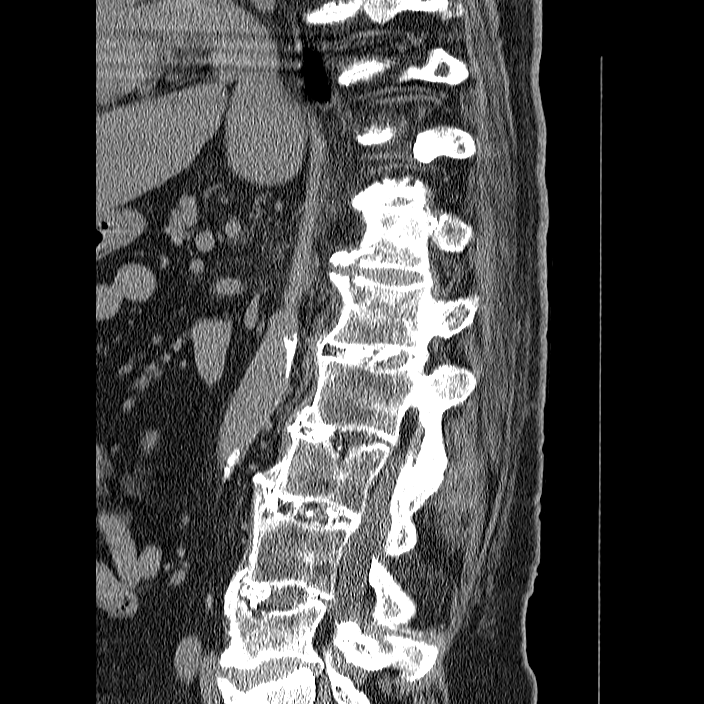
[im 40/79  bone]
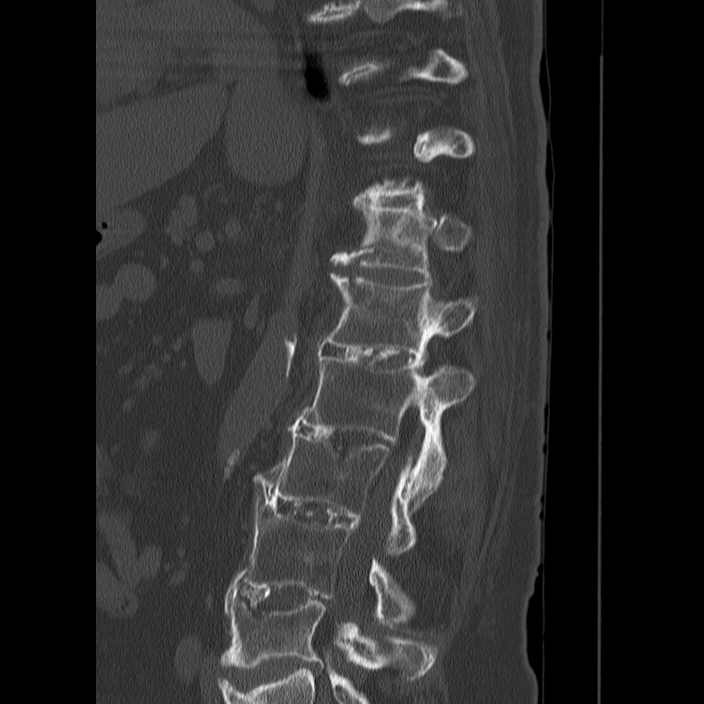
[im 46/79  bone]
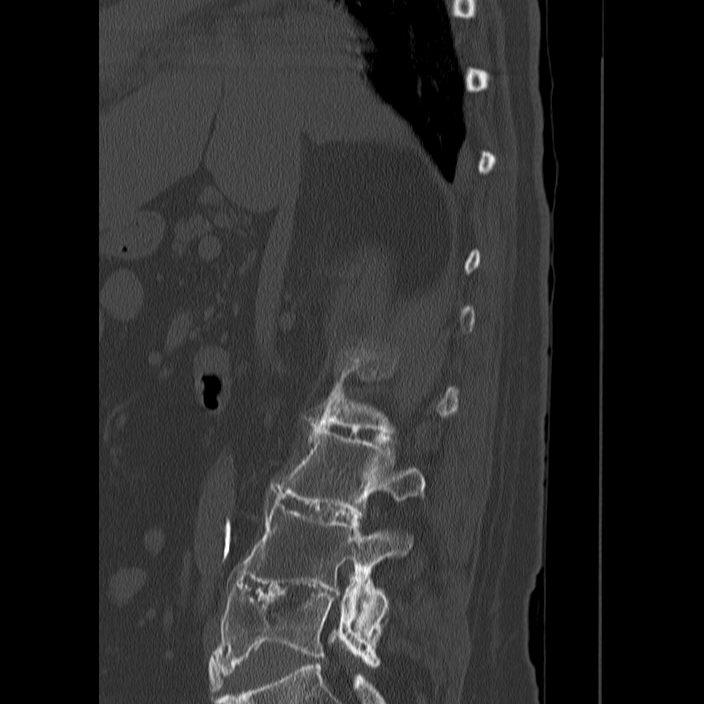
[im 53/79  bone]
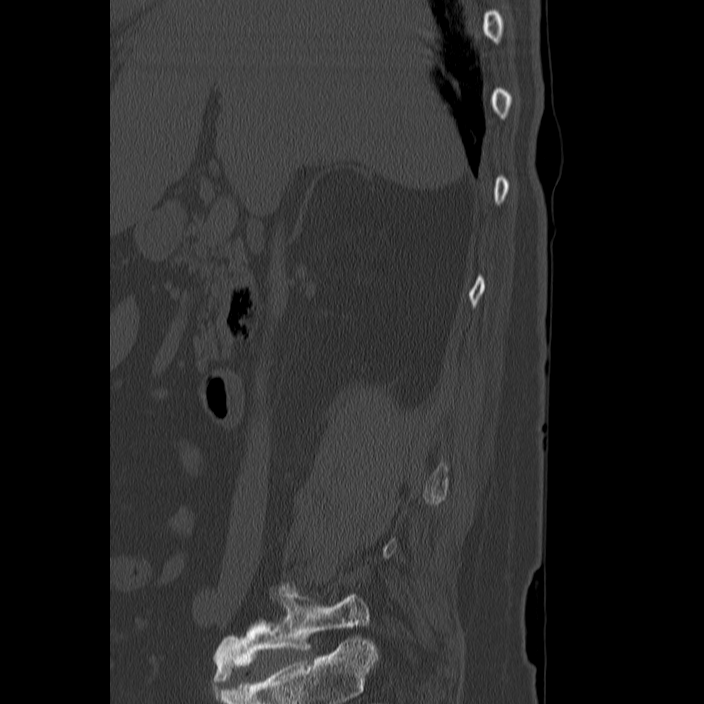

[11 of 33 positions shown; findings below may reference images not displayed]

Canned report from images found in remote index.

Refer to host system for actual result text.

## 2017-02-13 IMAGING — CT CT OUTSIDE FILMS SPINE
3 series · 11 of 33 positions shown, 13 images · non-contrast
Comparison: none

[Series 2: spine 3.0 · axial · 0.58mm/px · z∈[+1622,+1874]mm · 3 of 138 slices shown, 4 images]
[im 32/138  soft-tissue]
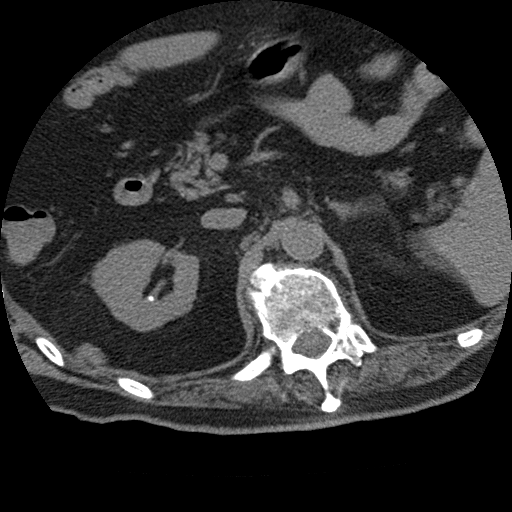
[im 32/138  bone]
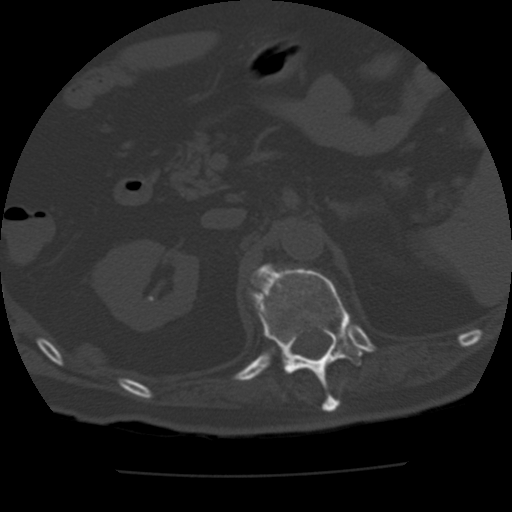
[im 74/138  bone]
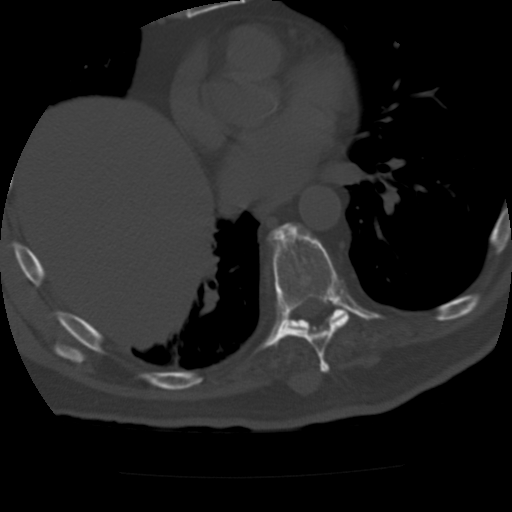
[im 116/138  bone]
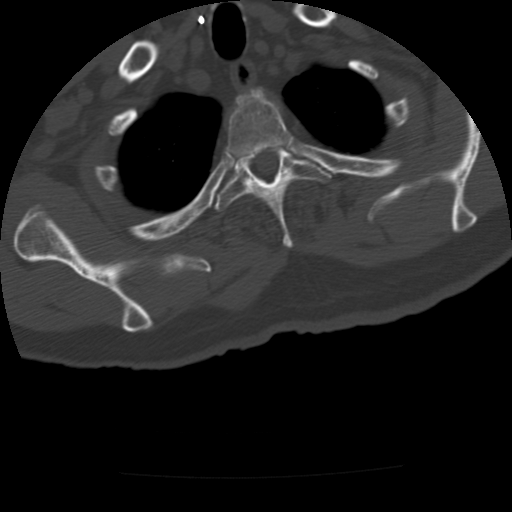

[Series 5: bone 3.0 coronal · coronal · 0.58mm/px · 3 of 85 slices shown]
[im 17/85  bone]
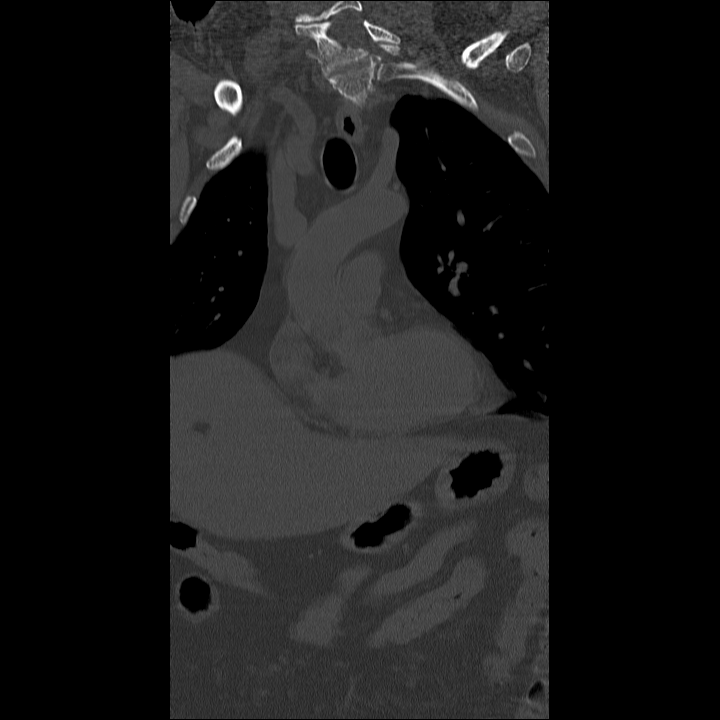
[im 34/85  bone]
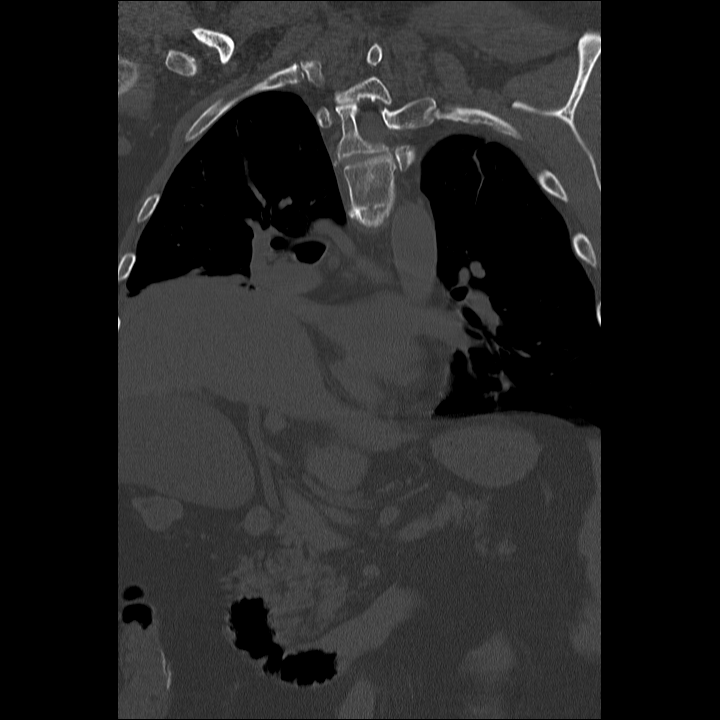
[im 51/85  bone]
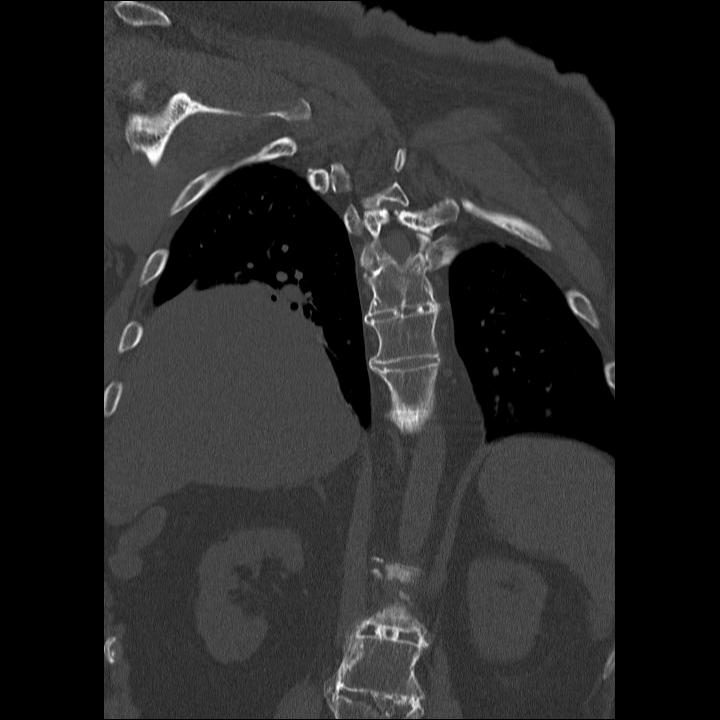

[Series 6: bone 3.0 sagittal · sagittal · 0.58mm/px · 5 of 89 slices shown, 6 images]
[im 30/89  bone]
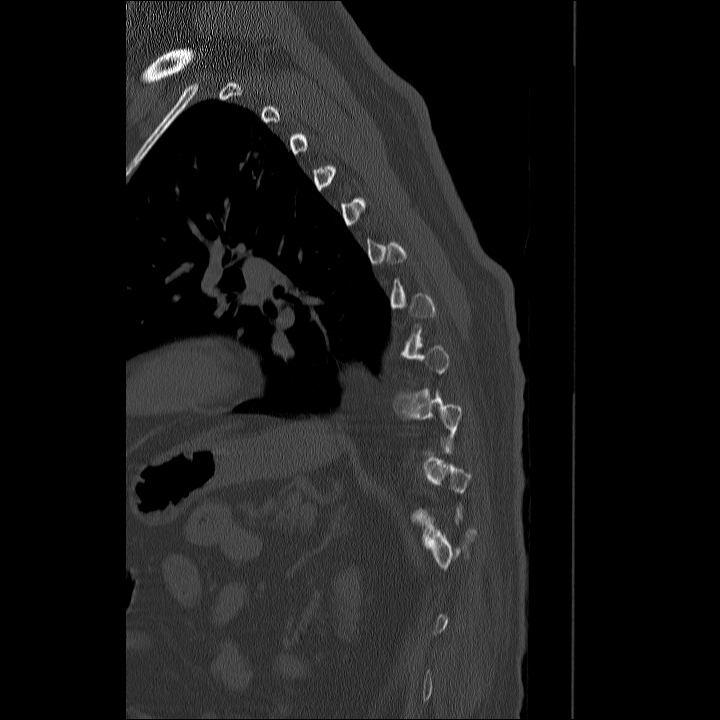
[im 37/89  bone]
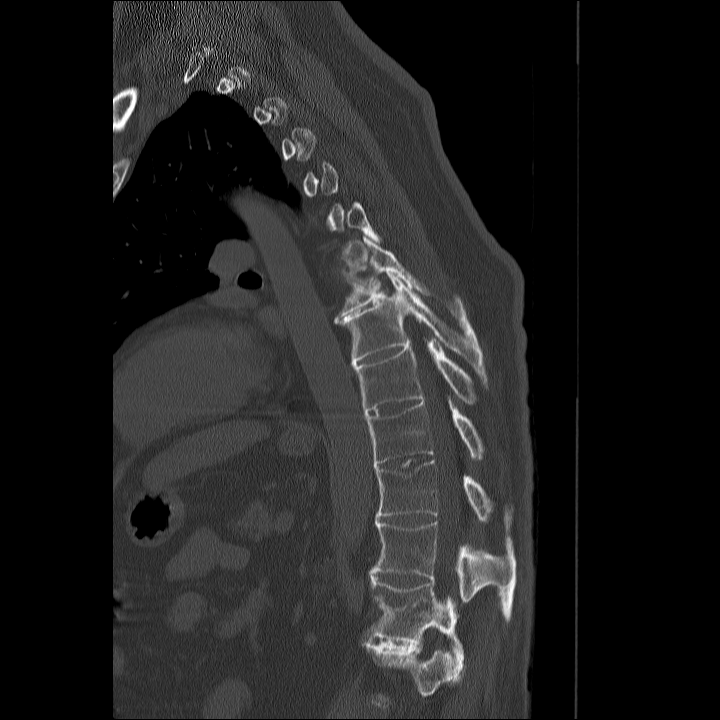
[im 45/89  soft-tissue]
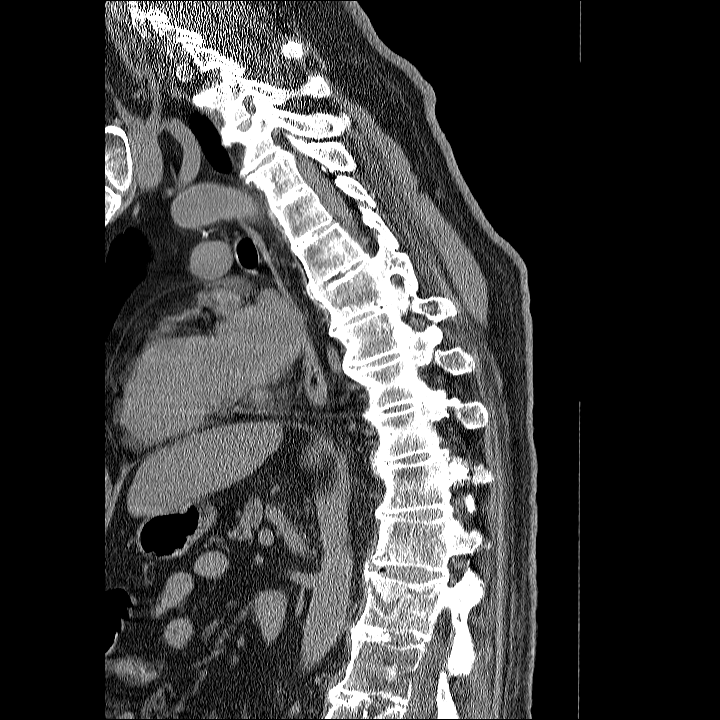
[im 45/89  bone]
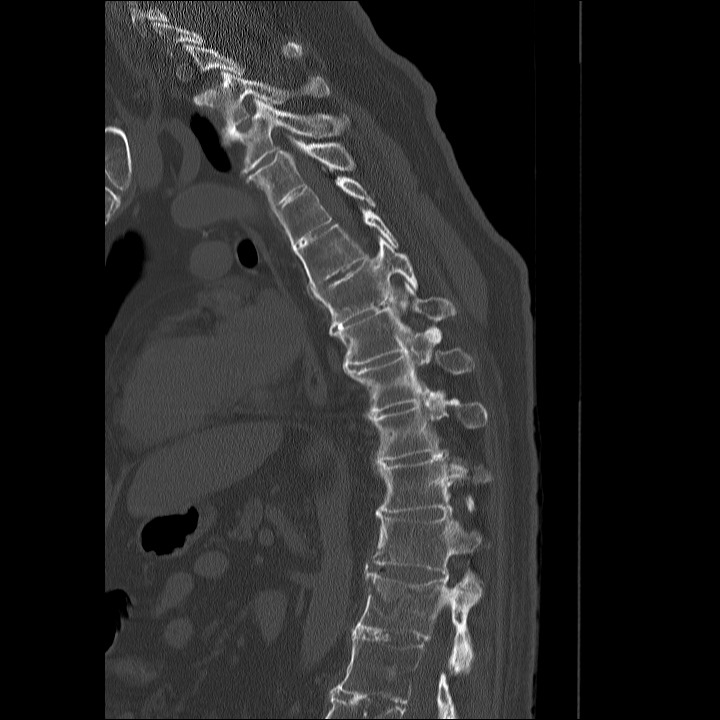
[im 52/89  bone]
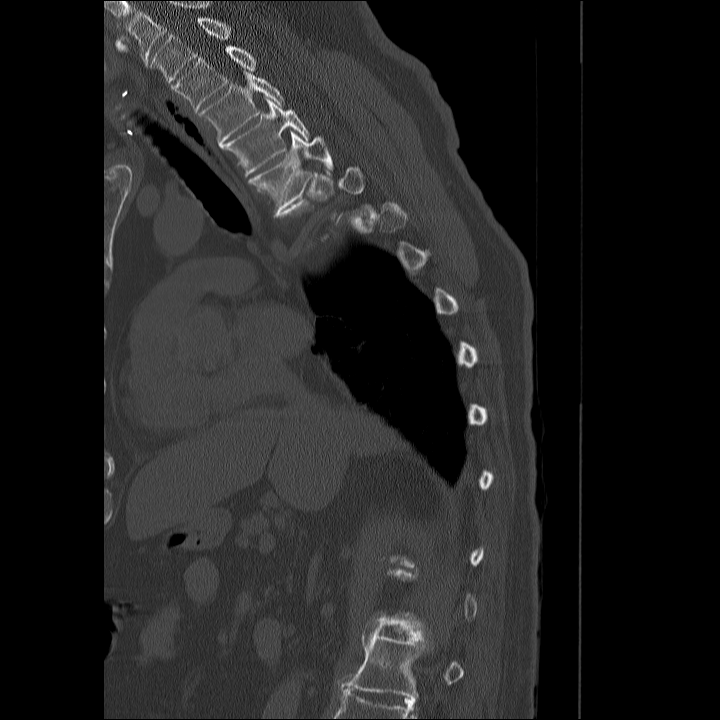
[im 59/89  bone]
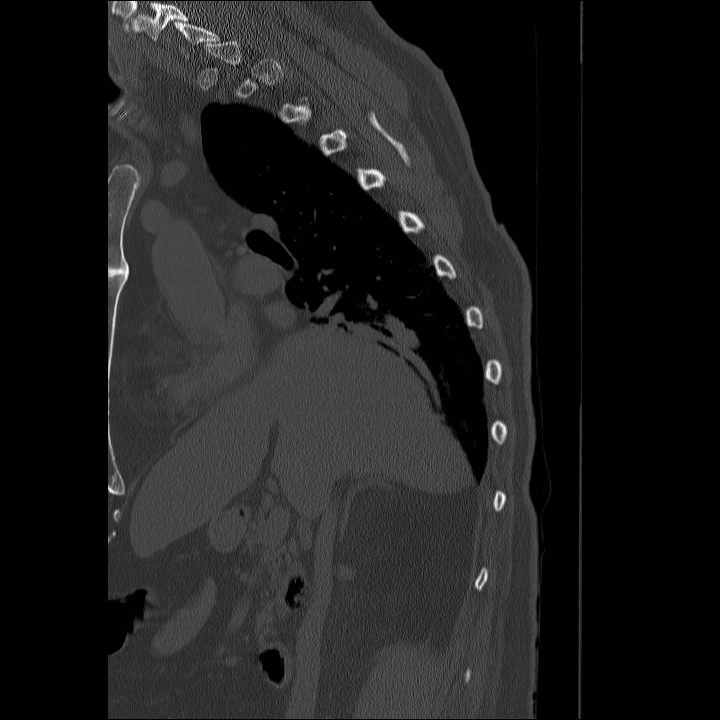

[11 of 33 positions shown; findings below may reference images not displayed]

Canned report from images found in remote index.

Refer to host system for actual result text.

## 2017-02-16 IMAGING — CT CT T SPINE W/O CM
4 series · 16 of 33 positions shown, 19 images · non-contrast
Comparison: CT scan of July 04, 2011.

CLINICAL DATA: T12, L1 fractures.

EXAM:
CT THORACIC AND LUMBAR SPINE WITHOUT CONTRAST
TECHNIQUE: Multidetector CT imaging of the thoracic and lumbar spine was
performed without contrast. Multiplanar CT image reconstructions
were also generated.

[Series 4: t-spine 2.0 i30s 3 · axial · 0.35mm/px · z∈[-1063,-811]mm · 5 of 190 slices shown, 7 images]
[im 32/190  soft-tissue]
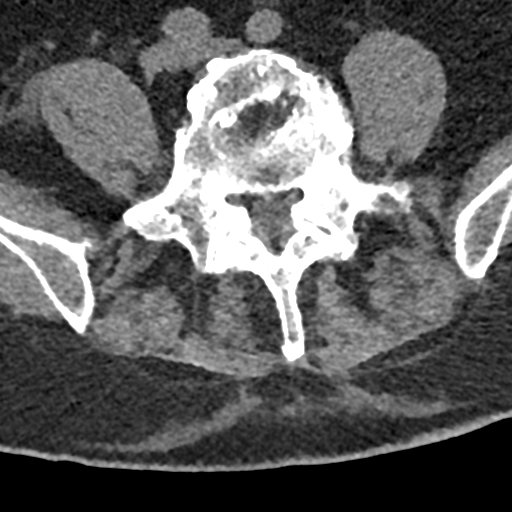
[im 32/190  bone]
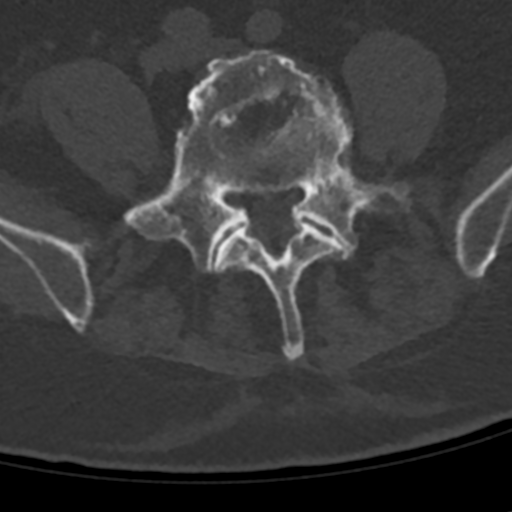
[im 64/190  bone]
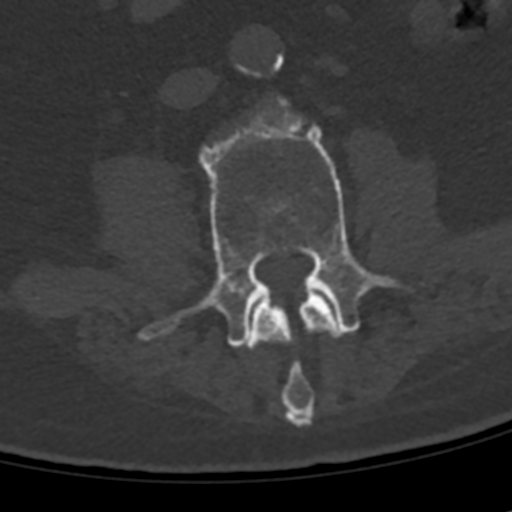
[im 95/190  bone]
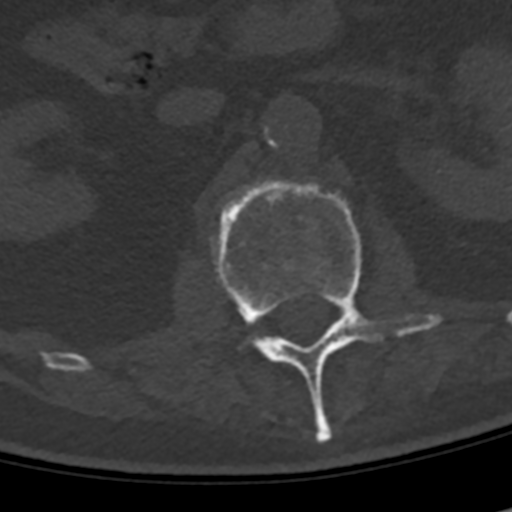
[im 127/190  bone]
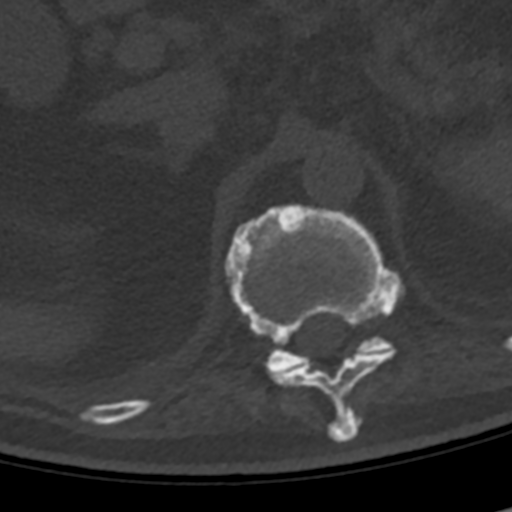
[im 158/190  soft-tissue]
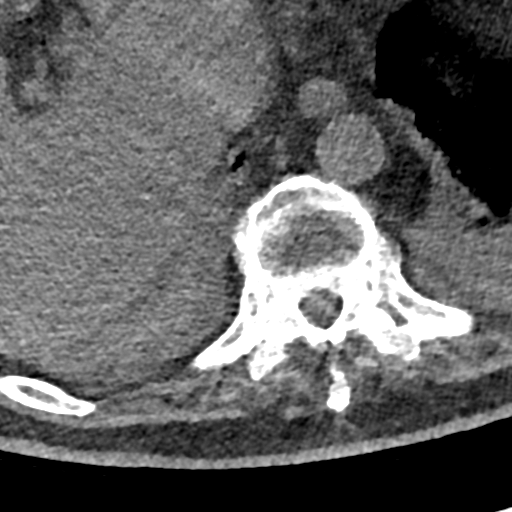
[im 158/190  bone]
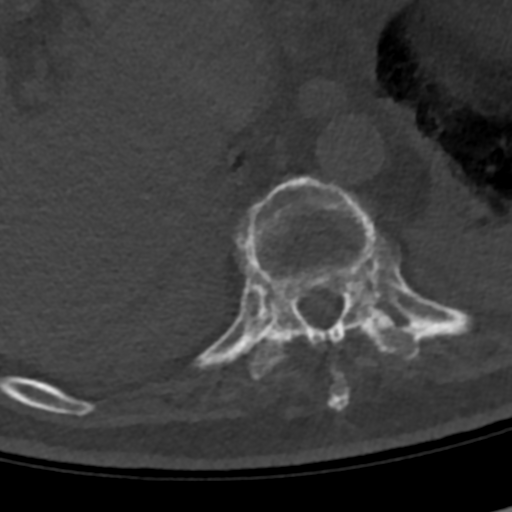

[Series 602: sagittal · sagittal · 0.74mm/px · 5 of 56 slices shown, 6 images]
[im 19/56  bone]
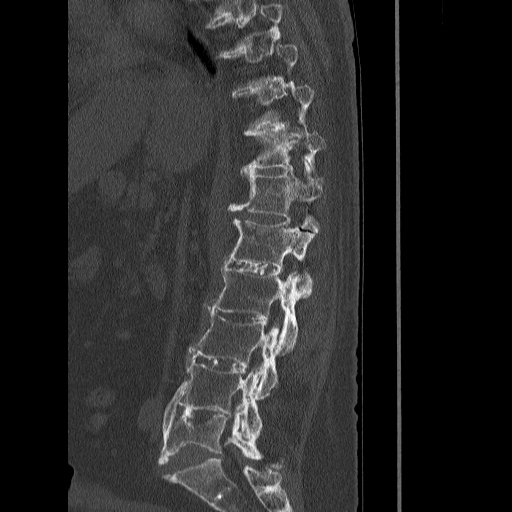
[im 23/56  bone]
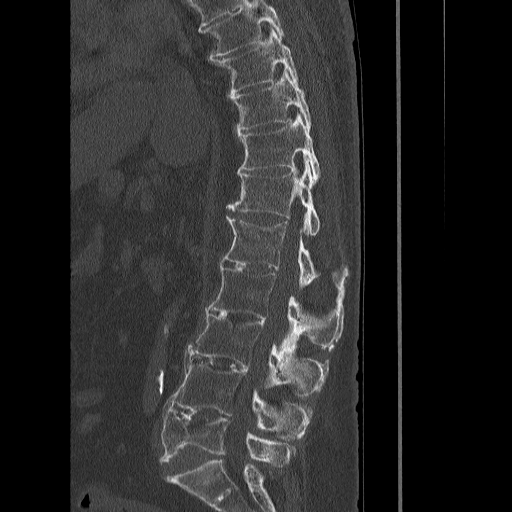
[im 28/56  soft-tissue]
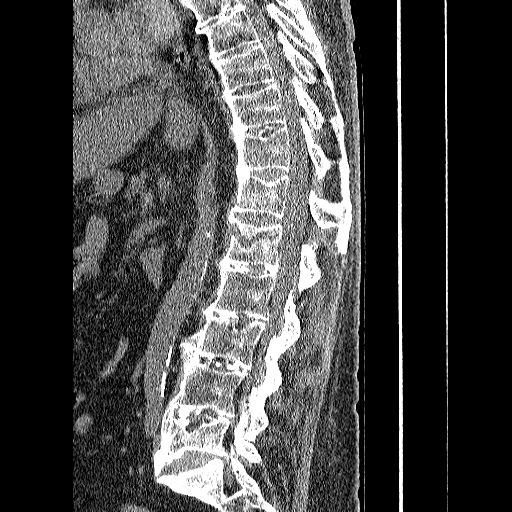
[im 28/56  bone]
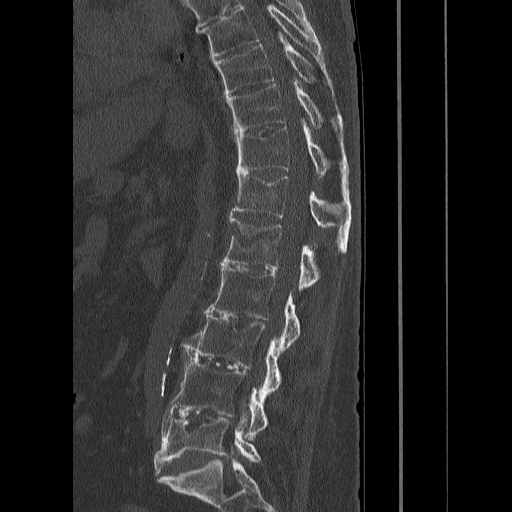
[im 33/56  bone]
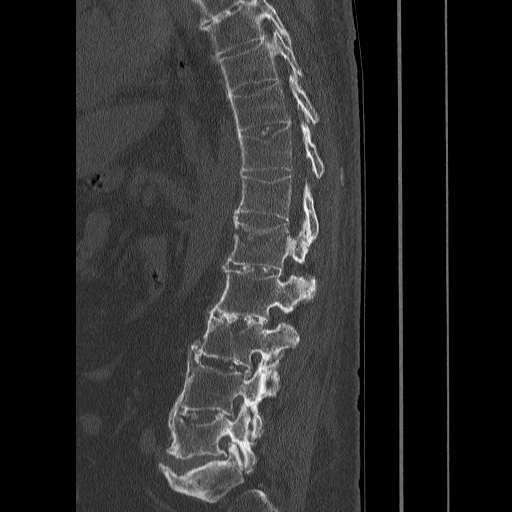
[im 37/56  bone]
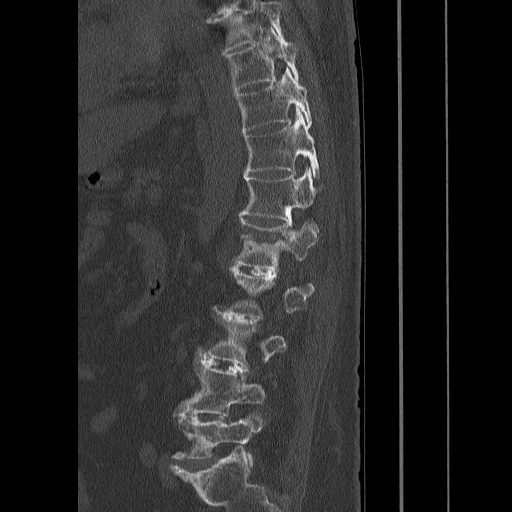

[Series 603: coronal · coronal · 0.74mm/px · 3 of 83 slices shown]
[im 17/83  bone]
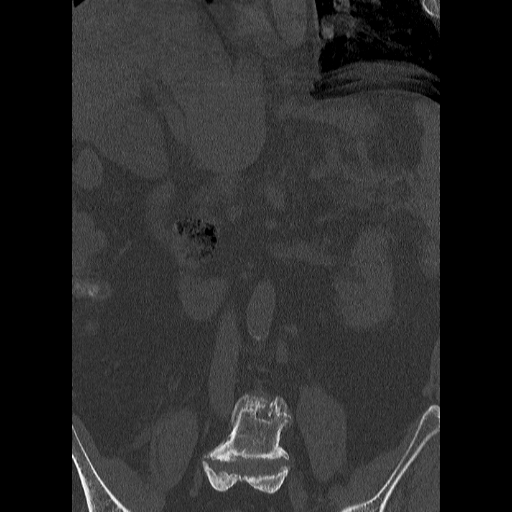
[im 33/83  bone]
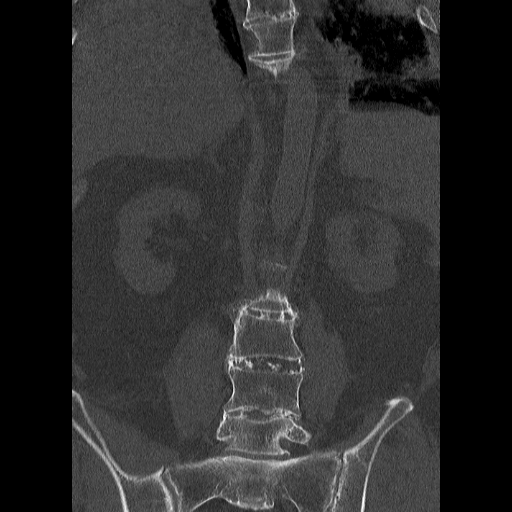
[im 50/83  bone]
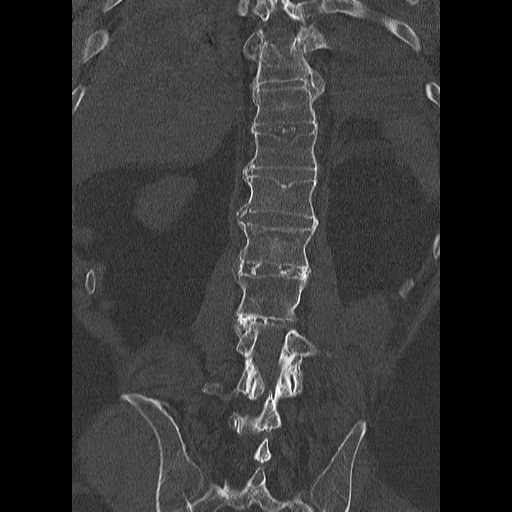

[Series 604: orthogonal · axial · 0.74mm/px · z∈[-1064,-937]mm · 3 of 189 slices shown]
[im 32/189  bone]
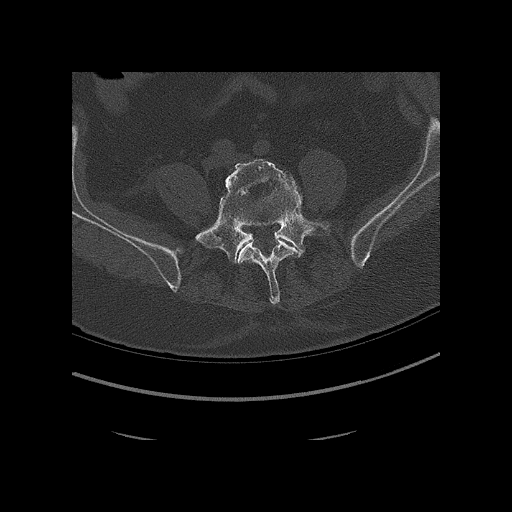
[im 63/189  bone]
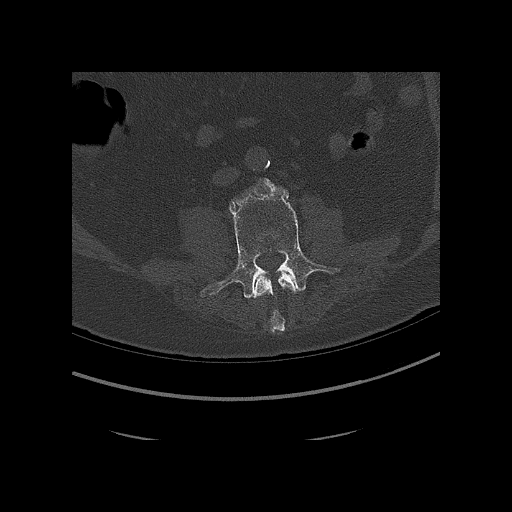
[im 95/189  bone]
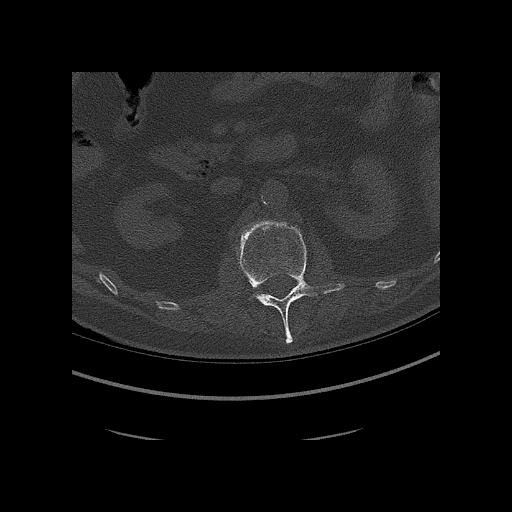

[16 of 33 positions shown; findings below may reference images not displayed]

FINDINGS: CT THORACIC SPINE FINDINGS

Only T8-T12 are completely visualized on this study. There appears
to be a nondisplaced fracture involving the anterior and inferior
corner of the T12 vertebral body. No definite evidence of central
spinal canal stenosis is noted.

CT LUMBAR SPINE FINDINGS

Minimal superior endplate depression is noted of L1 vertebral body
secondary to fracture extending through the superior aspect of the
vertebral body, which extends longitudinally into the left pedicle.
There is also noted a minimally displaced fracture involving the
right superior articular process of the same level. There is no
posterior displacement of fracture fragments and no evidence of
central spinal canal stenosis. No significant spondylolisthesis is
noted. Degenerative disc disease is noted throughout the lumbar
spine, with calcification present within the intervertebral disc
spaces of L1-2 thru L4-5. Osteophytes are noted at multiple levels,
particularly anteriorly, suggesting possible fusion of the lumbar
spine. Old L5 compression fracture is noted.
IMPRESSION: CT THORACIC SPINE IMPRESSION

Nondisplaced fracture involving the anterior and inferior corner the
T12 vertebral body. No spondylolisthesis is noted.

Acute fracture is seen extending through the superior portion of the
L1 vertebral body which extends longitudinally into the left
pedicle. Minimally displaced fracture involving the right superior
articular process posteriorly is also noted. No spondylolisthesis is
noted.

CT LUMBAR SPINE IMPRESSION

## 2017-02-17 IMAGING — RF DG THORACOLUMBAR SPINE 2V
1 series · 5 of 5 positions shown · non-contrast
Comparison: None.

CLINICAL DATA: T10-L4 fusion.

EXAM:
DG C-ARM 61-120 MIN; THORACOLUMBAR SPINE - 2 VIEW

[Series 1: run · 5 of 5 slices shown]
[im 1/5]
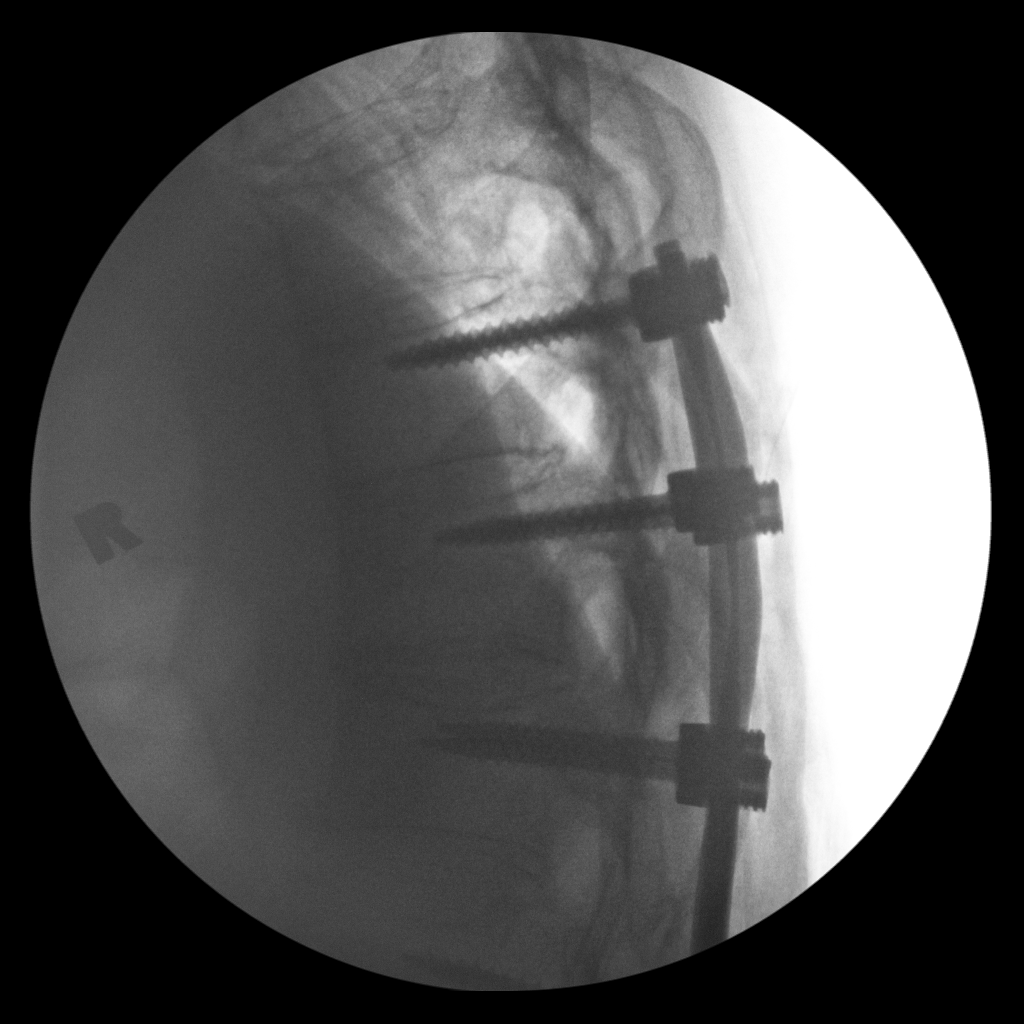
[im 2/5]
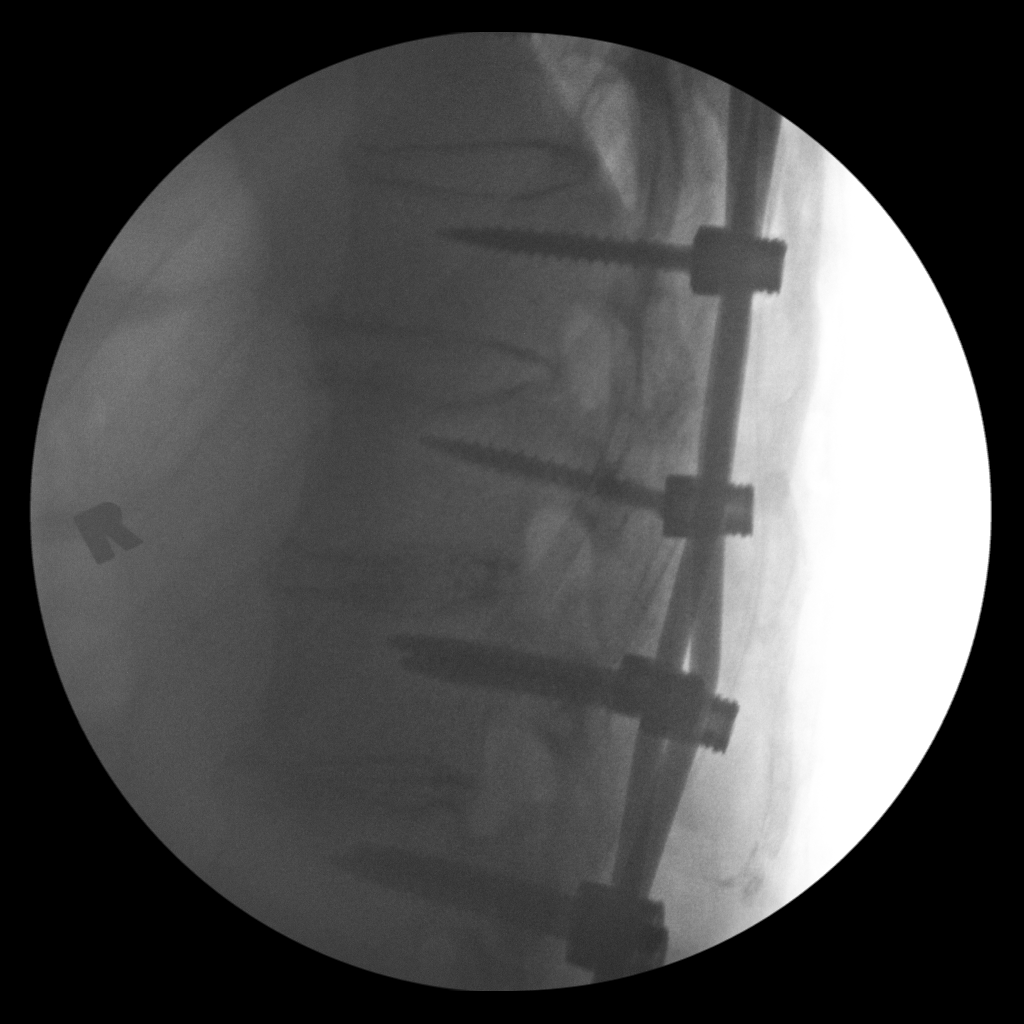
[im 3/5]
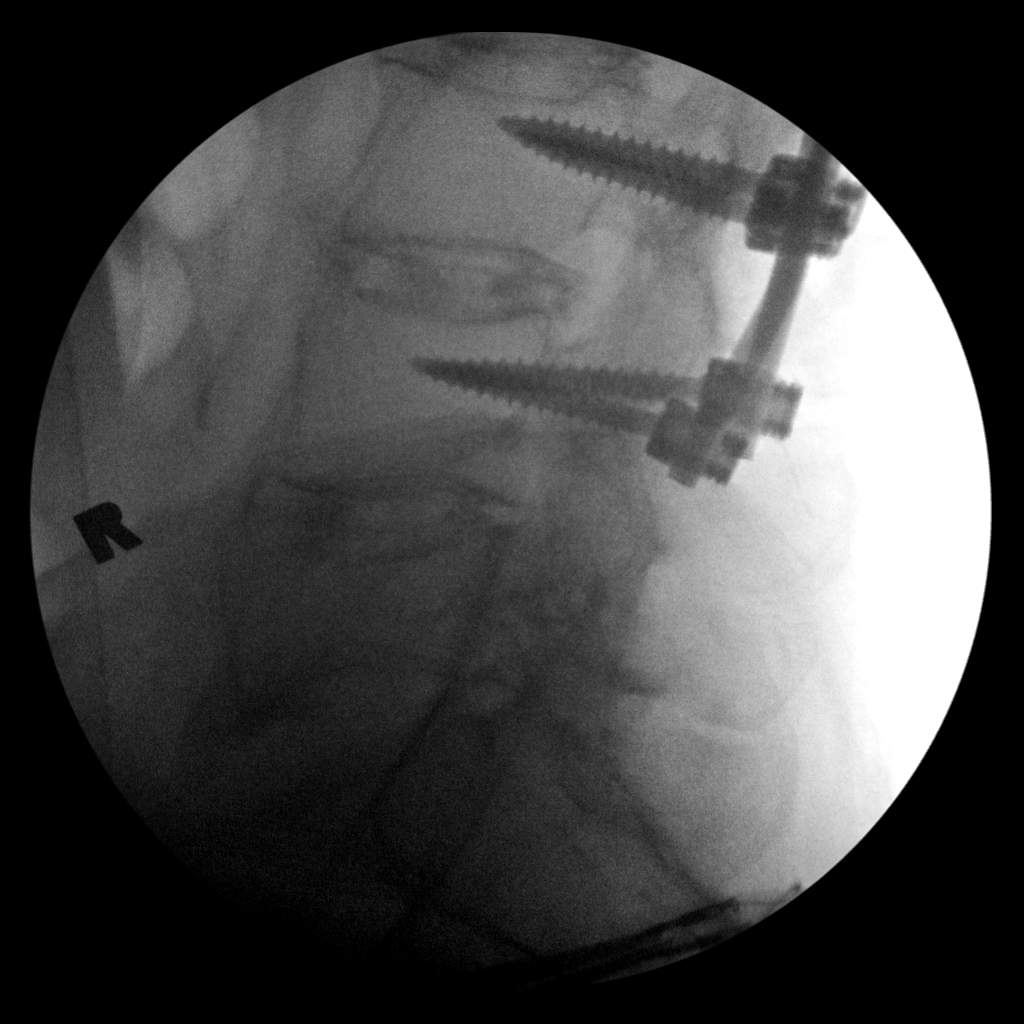
[im 4/5]
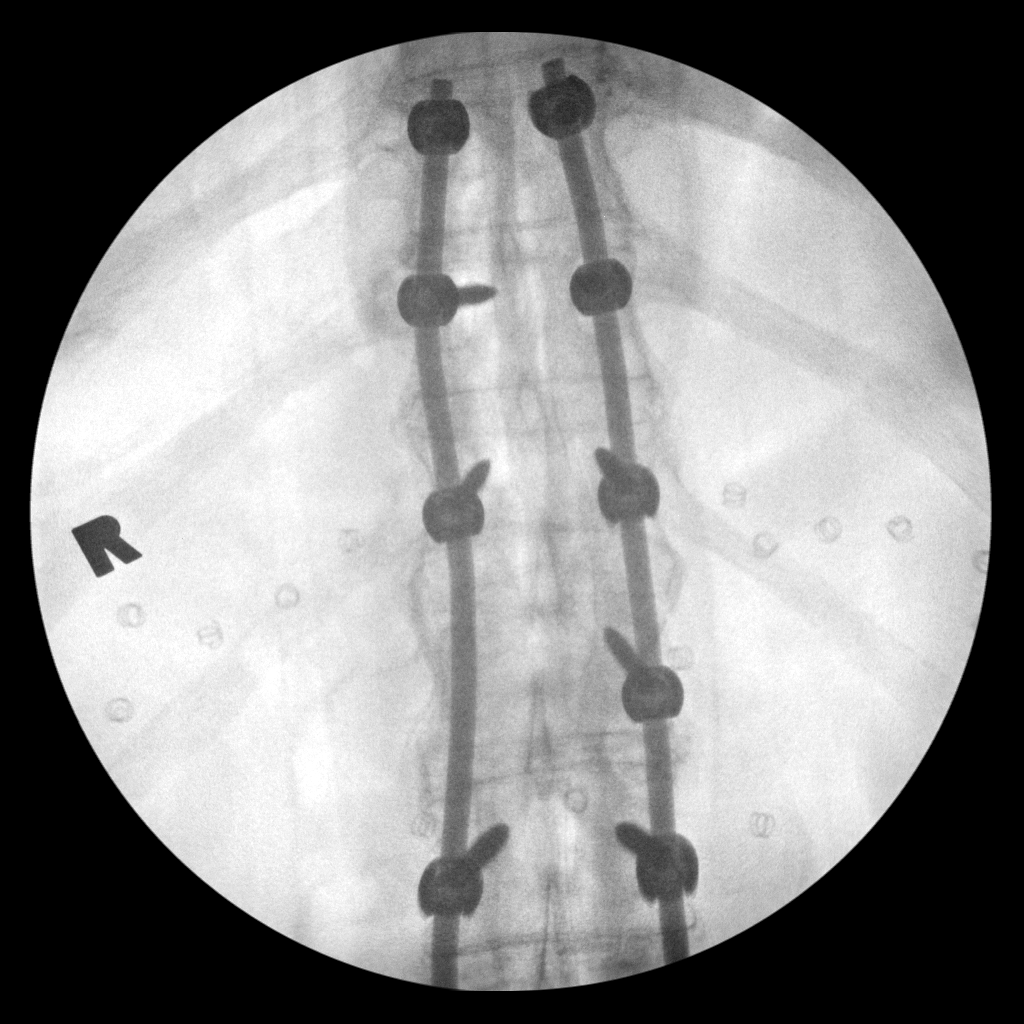
[im 5/5]
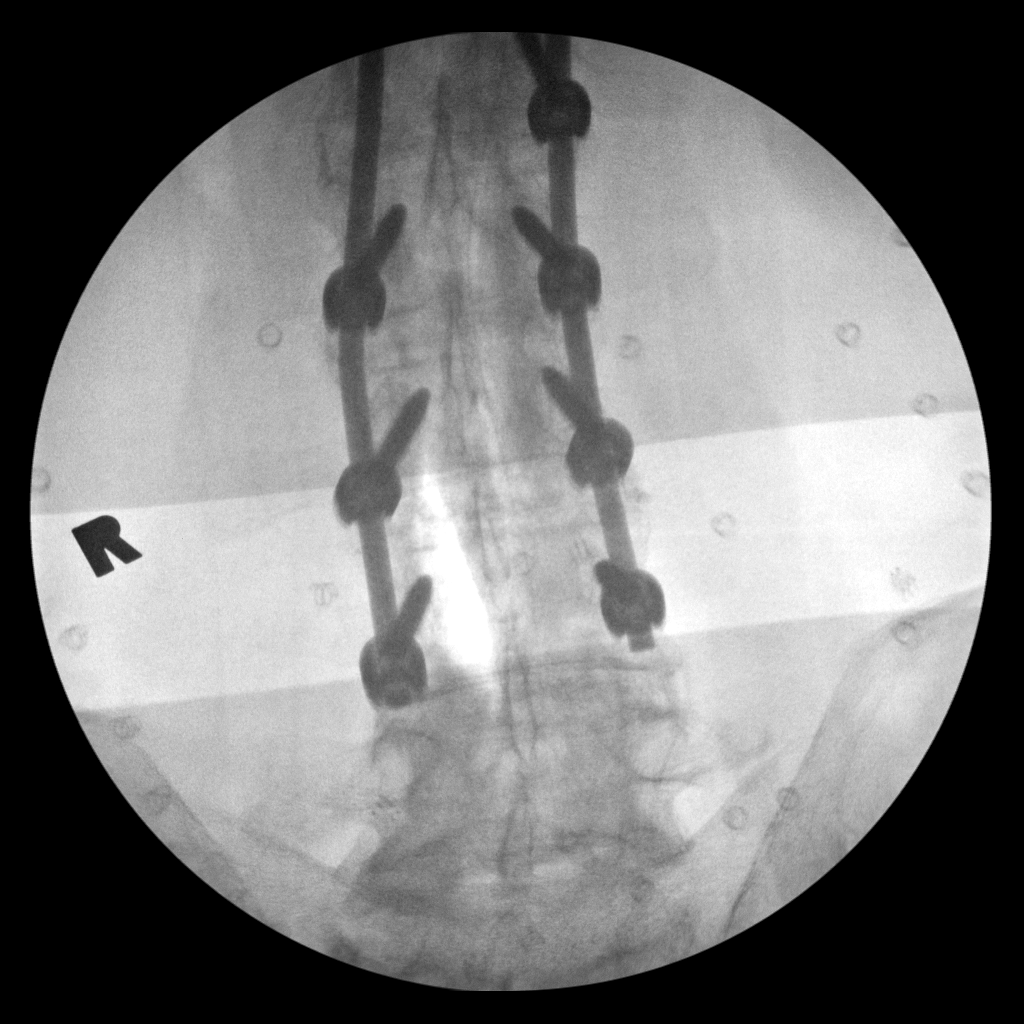

[5 of 5 positions shown; findings below may reference images not displayed]

FINDINGS: Thoracolumbar fusion. Hardware intact. Changes of I close in
spondylitis appear to be present.
IMPRESSION: Thoracolumbar fusion.  Hardware intact.

## 2017-02-17 IMAGING — DX DG CHEST 2V
3 series · 3 of 3 positions shown · non-contrast
Comparison: Two-view chest x-ray 04/01/2007.

CLINICAL DATA: Lumbar compression fractures. Preoperative chest
x-ray.

EXAM:
CHEST - 2 VIEW

[chest lat (1 of 2)]
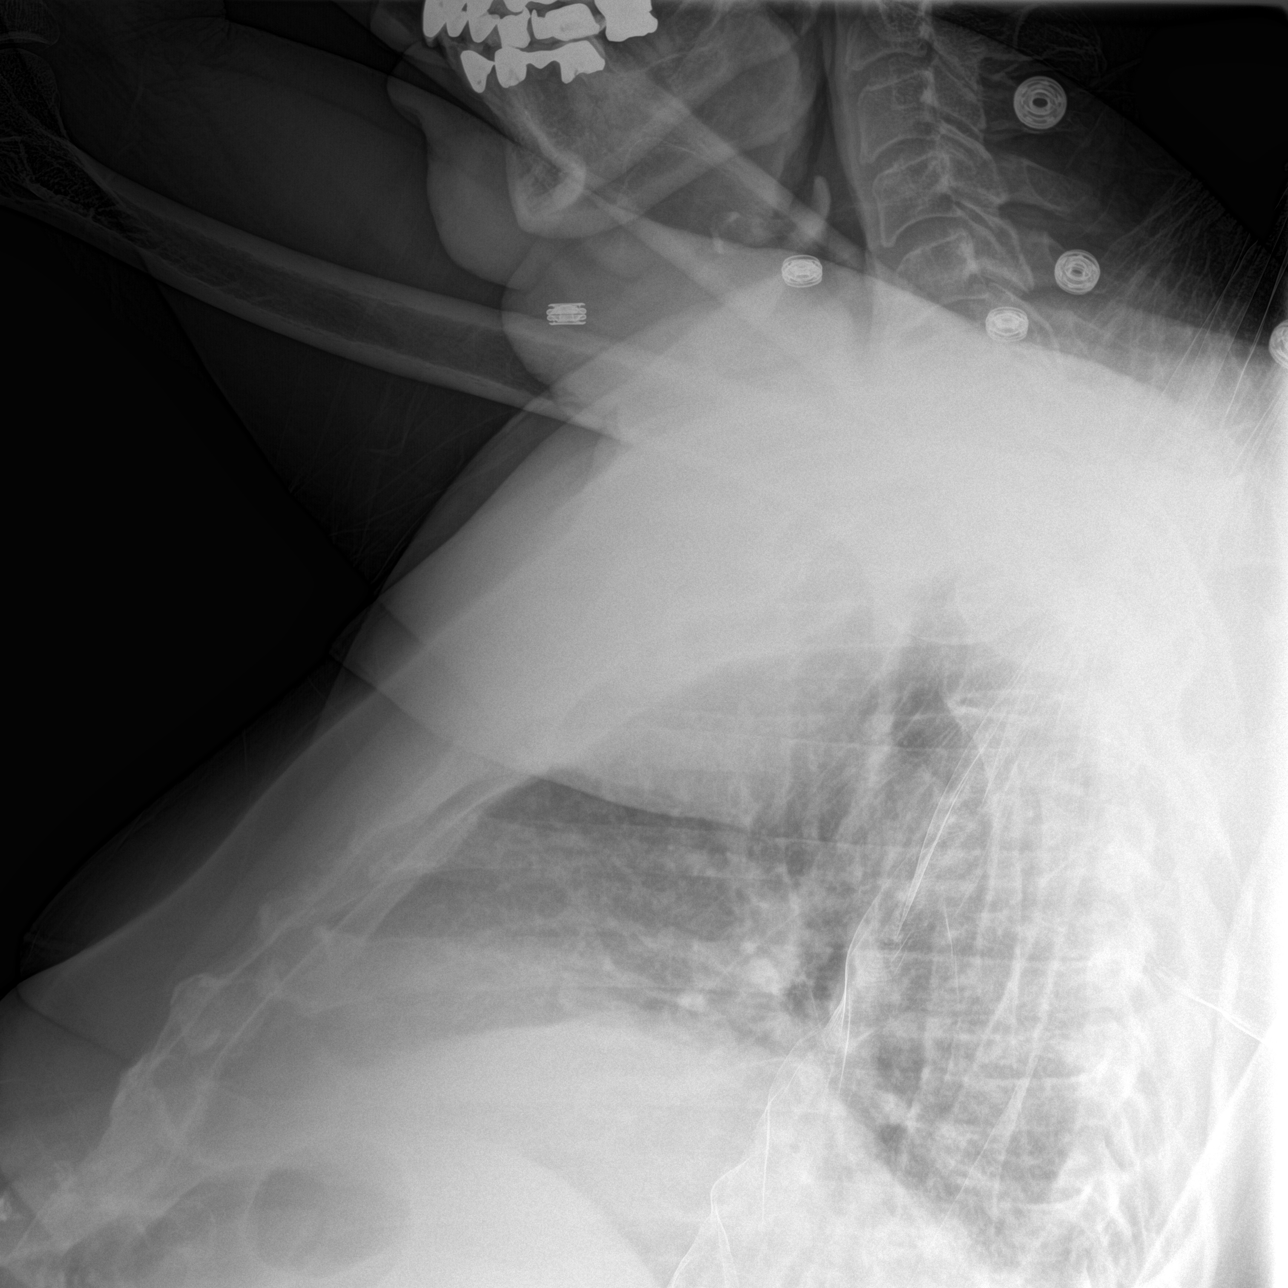

[chest ap]
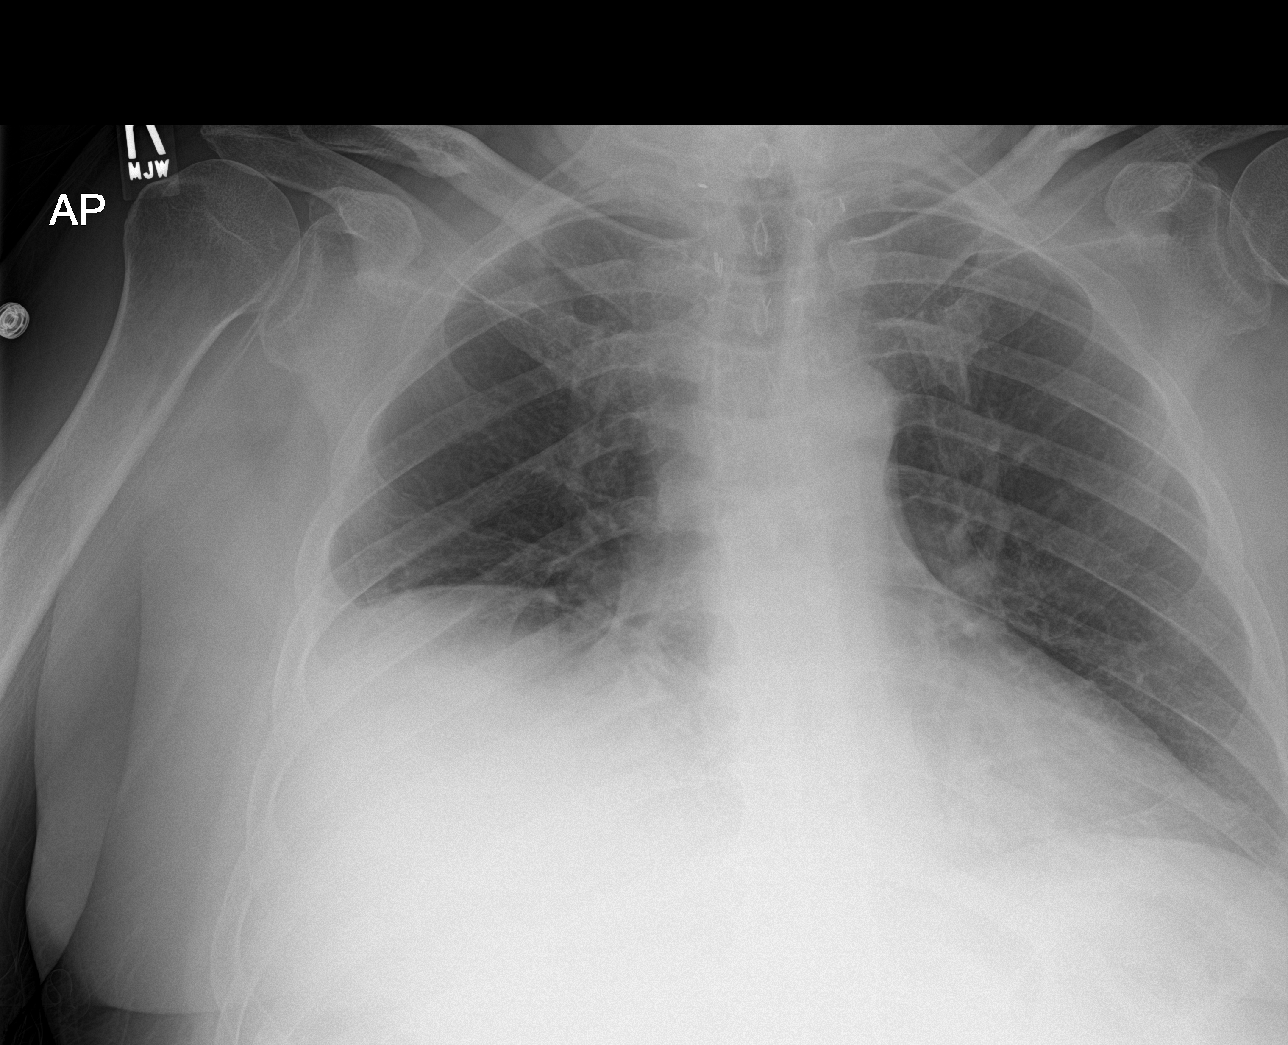

[chest lat (2 of 2)]
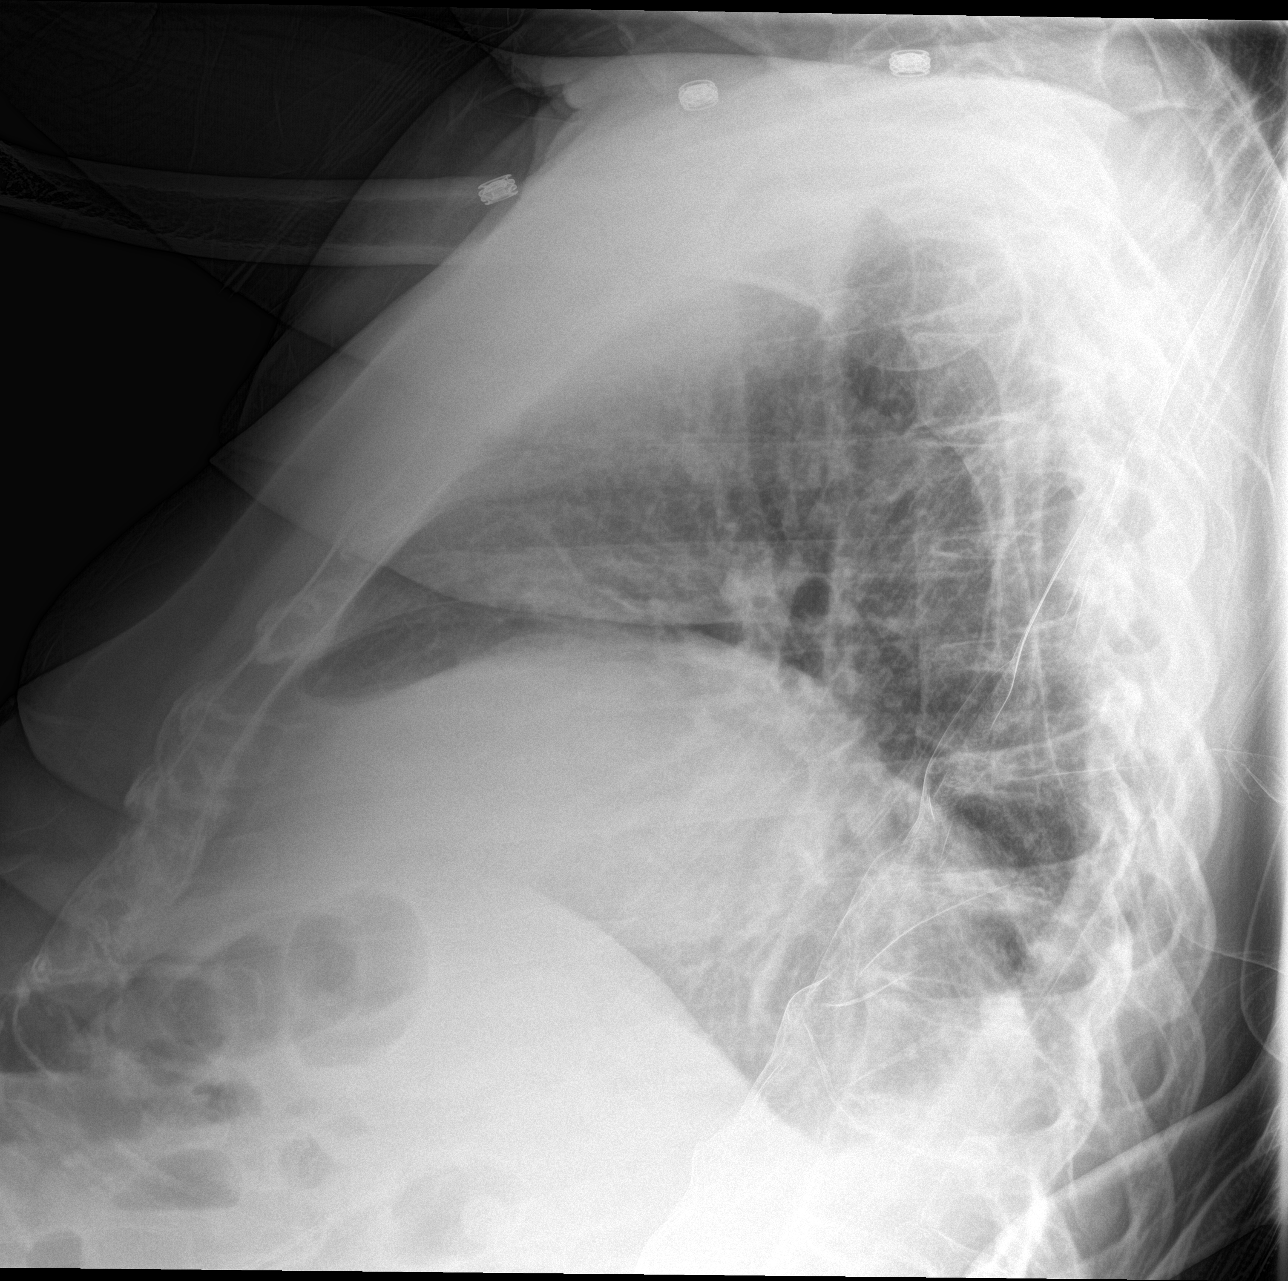

[3 of 3 positions shown; findings below may reference images not displayed]

FINDINGS: Chronic elevation of the right hemidiaphragm is again noted. The
heart is mildly enlarged. Medial right basilar airspace disease is
evident. Bilateral effusions are present. The compression fracture
is not well visualized on these radiographs.
IMPRESSION: 1. Medial right basilar airspace disease. The component this may be
chronic. Collapse is demonstrated on the CT scan. Superimposed
infection or aspiration is also considered.
2. Chronic elevation of the right hemidiaphragm.
3. Bilateral effusions.
4. The T12 and L1 fractures are not well visualized on this study.

## 2017-02-18 ENCOUNTER — Other Ambulatory Visit (HOSPITAL_BASED_OUTPATIENT_CLINIC_OR_DEPARTMENT_OTHER): Payer: Medicare Other

## 2017-02-18 ENCOUNTER — Ambulatory Visit (HOSPITAL_COMMUNITY)
Admission: RE | Admit: 2017-02-18 | Discharge: 2017-02-18 | Disposition: A | Discharge status: Home / Self Care | Payer: Medicare Other | Source: Ambulatory Visit | Attending: Internal Medicine | Admitting: Internal Medicine

## 2017-02-18 DIAGNOSIS — C450 Mesothelioma of pleura: Secondary | ICD-10-CM | POA: Diagnosis present

## 2017-02-18 DIAGNOSIS — C3492 Malignant neoplasm of unspecified part of left bronchus or lung: Secondary | ICD-10-CM | POA: Diagnosis not present

## 2017-02-18 DIAGNOSIS — M4854XA Collapsed vertebra, not elsewhere classified, thoracic region, initial encounter for fracture: Secondary | ICD-10-CM | POA: Diagnosis not present

## 2017-02-18 DIAGNOSIS — I700 Atherosclerosis of aorta: Secondary | ICD-10-CM | POA: Insufficient documentation

## 2017-02-18 DIAGNOSIS — C3490 Malignant neoplasm of unspecified part of unspecified bronchus or lung: Secondary | ICD-10-CM | POA: Diagnosis not present

## 2017-02-18 LAB — CBC WITH DIFFERENTIAL/PLATELET
BASO%: 0.3 % (ref 0.0–2.0)
Basophils Absolute: 0 10*3/uL (ref 0.0–0.1)
EOS%: 2.2 % (ref 0.0–7.0)
Eosinophils Absolute: 0.2 10*3/uL (ref 0.0–0.5)
HCT: 37.3 % — ABNORMAL LOW (ref 38.4–49.9)
HGB: 12.6 g/dL — ABNORMAL LOW (ref 13.0–17.1)
LYMPH%: 17 % (ref 14.0–49.0)
MCH: 31.8 pg (ref 27.2–33.4)
MCHC: 33.7 g/dL (ref 32.0–36.0)
MCV: 94.2 fL (ref 79.3–98.0)
MONO#: 0.6 10*3/uL (ref 0.1–0.9)
MONO%: 6 % (ref 0.0–14.0)
NEUT#: 7.1 10*3/uL — ABNORMAL HIGH (ref 1.5–6.5)
NEUT%: 74.5 % (ref 39.0–75.0)
Platelets: 246 10*3/uL (ref 140–400)
RBC: 3.96 10*6/uL — ABNORMAL LOW (ref 4.20–5.82)
RDW: 13.3 % (ref 11.0–14.6)
WBC: 9.5 10*3/uL (ref 4.0–10.3)
lymph#: 1.6 10*3/uL (ref 0.9–3.3)

## 2017-02-18 LAB — COMPREHENSIVE METABOLIC PANEL
ALT: 35 U/L (ref 0–55)
AST: 35 U/L — ABNORMAL HIGH (ref 5–34)
Albumin: 3.7 g/dL (ref 3.5–5.0)
Alkaline Phosphatase: 102 U/L (ref 40–150)
Anion Gap: 10 mEq/L (ref 3–11)
BUN: 32.1 mg/dL — ABNORMAL HIGH (ref 7.0–26.0)
CO2: 22 mEq/L (ref 22–29)
Calcium: 9.8 mg/dL (ref 8.4–10.4)
Chloride: 109 mEq/L (ref 98–109)
Creatinine: 1.9 mg/dL — ABNORMAL HIGH (ref 0.7–1.3)
EGFR: 35 mL/min/{1.73_m2} — ABNORMAL LOW (ref >90)
Glucose: 135 mg/dl (ref 70–140)
Potassium: 4.5 mEq/L (ref 3.5–5.1)
Sodium: 140 mEq/L (ref 136–145)
Total Bilirubin: 0.31 mg/dL (ref 0.20–1.20)
Total Protein: 6.9 g/dL (ref 6.4–8.3)

## 2017-02-21 DIAGNOSIS — E790 Hyperuricemia without signs of inflammatory arthritis and tophaceous disease: Secondary | ICD-10-CM | POA: Insufficient documentation

## 2017-02-21 DIAGNOSIS — E109 Type 1 diabetes mellitus without complications: Secondary | ICD-10-CM | POA: Insufficient documentation

## 2017-02-21 DIAGNOSIS — Q430 Meckel's diverticulum (displaced) (hypertrophic): Secondary | ICD-10-CM | POA: Insufficient documentation

## 2017-02-21 DIAGNOSIS — C3412 Malignant neoplasm of upper lobe, left bronchus or lung: Secondary | ICD-10-CM | POA: Insufficient documentation

## 2017-02-21 DIAGNOSIS — E785 Hyperlipidemia, unspecified: Secondary | ICD-10-CM | POA: Insufficient documentation

## 2017-02-21 DIAGNOSIS — E039 Hypothyroidism, unspecified: Secondary | ICD-10-CM | POA: Insufficient documentation

## 2017-02-21 DIAGNOSIS — C73 Malignant neoplasm of thyroid gland: Secondary | ICD-10-CM | POA: Insufficient documentation

## 2017-02-21 DIAGNOSIS — K5080 Crohn's disease of both small and large intestine without complications: Secondary | ICD-10-CM | POA: Insufficient documentation

## 2017-02-25 ENCOUNTER — Telehealth: Payer: Self-pay | Admitting: Oncology

## 2017-02-25 ENCOUNTER — Encounter: Payer: Self-pay | Admitting: Oncology

## 2017-02-25 ENCOUNTER — Ambulatory Visit (HOSPITAL_BASED_OUTPATIENT_CLINIC_OR_DEPARTMENT_OTHER): Payer: Medicare Other | Admitting: Oncology

## 2017-02-25 VITALS — BP 111/66 | HR 74 | Temp 97.8°F | Resp 18 | Wt 211.5 lb

## 2017-02-25 DIAGNOSIS — C3412 Malignant neoplasm of upper lobe, left bronchus or lung: Secondary | ICD-10-CM | POA: Diagnosis not present

## 2017-02-25 DIAGNOSIS — N289 Disorder of kidney and ureter, unspecified: Secondary | ICD-10-CM

## 2017-02-25 DIAGNOSIS — C3492 Malignant neoplasm of unspecified part of left bronchus or lung: Secondary | ICD-10-CM

## 2017-02-25 NOTE — Telephone Encounter (Signed)
Gave avs and calendar for September 2019

## 2017-02-25 NOTE — Progress Notes (Signed)
[facility] Follow up:    Devin [name], No Name [place] [id number]   DIAGNOSIS: Cancer Staging Adenocarcinoma of left lung, stage 1 ([place]) Staging form: Lung, AJCC 7th Edition - Clinical stage from 04/14/2015: Stage IA (T1b, N0, M0) - Signed by Curt Bears, MD on 04/14/2015  History of thyroid cancer Staging form: Thyroid - Papillary or Follicular, AJCC 7th Edition - Pathologic: 1 - Unsigned - Clinical stage from 01/19/2013: 1 - Unsigned  Stage IA (T1b, N0, M0) non-small cell lung cancer, adenocarcinoma presenting with left upper lobe pulmonary nodule diagnosed in September 2016.  SUMMARY OF ONCOLOGIC HISTORY:  No history exists.   PRIOR THERAPY: Status post left VATS with left upper lobectomy and mediastinal lymph node dissection under the care of Dr. Prescott Gum on 03/16/2015.  CURRENT THERAPY: observation  INTERVAL HISTORY: Devin [name] 76 y.o. male returns for a six-month follow-up visit. The patient is feeling fine today with no specific complaints. He denies fevers and chills. Denies having any chest pain, shortness breath, cough, hemoptysis. He denies any significant weight loss or night sweats. The patient had a recent CT scan of the chest and is here for evaluation and discussion of his scan results.   Patient Active Problem List   Diagnosis Date Noted  . Cholecystitis with cholelithiasis 05/07/2016  . Cholecystitis 03/14/2016  . Abdominal pain 03/05/2016  . Malnutrition of moderate degree 02/10/2016  . Thoracic compression fracture ([place]) 02/08/2016  . Back pain 01/26/2016  . Hip pain 01/26/2016  . S/P lobectomy of lung 03/16/2015  . Adenocarcinoma of left lung, stage 1 ([place]) 03/10/2015  . Diabetic neuropathy ([place])   . Ankylosing spondylitis ([place])   . Obesity (BMI 30-39.9)   . Crohn's disease ([place])   . Lung nodule 02/23/2015  . HIstory of basal cell cancer of face   . History of thyroid cancer   . Postsurgical  hypothyroidism   . Gout 12/29/2006  . Essential hypertension 12/29/2006  . Osteoporosis 12/29/2006  . 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 Prostate cancer with recurrence     has No Known Allergies.  MEDICAL HISTORY: Past Medical History:  Diagnosis Date  . Adenocarcinoma of left lung, stage 1 ([place]) 03/10/2015  . Ankylosing spondylitis ([place])    Diagnosed during lumbar fracture summer of 2016    . Crohn's disease ([place])   . Gout   . HIstory of basal cell cancer of face    THYROID CA HX  . Hypertension   . Hypocalcemia 04/13/2007  . Hypothyroidism   . Impotence   . 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 Obesity (BMI 30-39.9)   . Osteoporosis    Pt completed 5 years of fosamax in 2013     . Prostate cancer with recurrence    Treated with prostatectomy with recurrence 2012 with Lupron treatment now him   . Psoriasis   . Thyroid cancer ([place]) 1999   Treated with RAI and total thyroidectomy   . Type II diabetes mellitus ([place])     SURGICAL HISTORY: Past Surgical History:  Procedure Laterality Date  . ABDOMINAL EXPLORATION SURGERY     for small bowel obstruction  . APPENDECTOMY  03/2007  . BACK SURGERY    . BASAL CELL CARCINOMA EXCISION  "several"   "head"  . CARDIAC CATHETERIZATION  03/17/2003  . CHOLECYSTECTOMY N/A 05/07/2016   Procedure: LAPAROSCOPIC CHOLECYSTECTOMY;  Surgeon: Fanny Skates, MD;  Location: [facility];  Service: General;  Laterality: N/A;  . COLON SURGERY  03/2007   Resection of cecum, appendix, terminal ileum (approximately/notes 10/10/2010  . HERNIA REPAIR    . LAPAROSCOPIC CHOLECYSTECTOMY  05/07/2016  . LAPAROSCOPIC LYSIS OF ADHESIONS  05/07/2016  . LAPAROSCOPIC LYSIS OF ADHESIONS N/A 05/07/2016   Procedure: LAPAROSCOPIC LYSIS OF ADHESIONS TIMES ONE HOUR;  Surgeon: Fanny Skates, MD;  Location: [facility];  Service: General;  Laterality: N/A;  . POSTERIOR FUSION THORACIC SPINE  02/08/2016   1.  Posterior thoracic arthrodesis T7-T11 utilizing morcellized allograft, 2. Posterior thoracic segmental fixation T7-T11 utilizing nuvasive pedicle screws  . PROSTATECTOMY  06/2001   w/bilateral pelvic lymph nose dissection/notes 10/24/2010  . SPINAL FUSION  12/2014   Open reduction internal fixation of L1 [place] fracture with posterior fusion T10-L4 utilizing morcellized allograft and some local autograft, segmental instrumentation T10-L4 inclusive utilizing nuvasive pedicle screws/notes 12/16/2014  . Stress Cardiolite  02/17/2003  . THOROCOTOMY WITH LOBECTOMY  03/16/2015   Procedure: THOROCOTOMY WITH LOBECTOMY;  Surgeon: Ivin Poot, MD;  Location: [facility];  Service: Thoracic;;  . TONSILLECTOMY    . TOTAL THYROIDECTOMY  1997  . Venous Doppler  05/30/2004  . VENTRAL HERNIA REPAIR  04/14/2008  . VIDEO ASSISTED THORACOSCOPY Left 03/16/2015   Procedure: VIDEO ASSISTED THORACOSCOPY;  Surgeon: Ivin Poot, MD;  Location: [facility] OR;  Service: Thoracic;  Laterality: Left;    SOCIAL HISTORY: Social History   Social History  . Marital status: Married    Spouse name: N/A  . Number of children: N/A  . Years of education: N/A   Occupational History  . Retired    Social History Main Topics  . Smoking status: Former Smoker    Packs/day: 2.00    Years: 30.00    Types: Cigarettes    Quit date: 12/26/1989  . Smokeless tobacco: Never Used  . Alcohol use 0.0 oz/week     Comment: 02/08/2016 "might have a couple drinks/year; might not"  . Drug use: No  . Sexual activity: No   Other Topics Concern  . Not on file   Social History Narrative   Retired Development worker, international [employer]    FAMILY HISTORY: Family History  Problem Relation Age of Onset  . Cancer Neg Hx        No cancer in the patient's immediate family, except of course for the patient himself, as noted    Review of Systems  Constitutional: Negative.   HENT:  Negative.   Eyes: Negative.   Respiratory: Negative.   Cardiovascular: Negative.    Gastrointestinal: Negative.   Genitourinary: Negative.    Skin: Negative.   Neurological: Negative.   Hematological: Negative.   Psychiatric/Behavioral: Negative.       PHYSICAL EXAMINATION  ECOG PERFORMANCE STATUS: 1 - Symptomatic but completely ambulatory  Vitals:   02/25/17 1003 02/25/17 1021  BP: (!) 99/44 111/66  Pulse: 76 74  Resp: 18   Temp: 97.8 F (36.6 C)   SpO2: 98%     Physical Exam  Constitutional: He is oriented to person, place, and time and well-developed, well-nourished, and in no distress. No distress.  HENT:  Head: Normocephalic.  Mouth/Throat: Oropharynx is clear and moist. No oropharyngeal exudate.  Eyes: Conjunctivae are normal. Right eye exhibits no discharge. Left eye exhibits no discharge. No scleral icterus.  Neck: Normal range of motion. Neck supple.  Cardiovascular: Normal rate, regular rhythm, normal heart sounds and intact distal pulses.   Pulmonary/Chest: Effort normal and breath sounds normal. No respiratory distress. He  has no wheezes. He has no rales.  Abdominal: Soft. Bowel sounds are normal. He exhibits no distension and no mass. There is no tenderness.  Musculoskeletal: Normal range of motion. He exhibits no edema.  Lymphadenopathy:    He has no cervical adenopathy.  Neurological: He is alert and oriented to person, place, and time. He exhibits normal muscle tone. Gait normal.  Skin: Skin is warm and dry. No rash noted. He is not diaphoretic. No erythema. No pallor.  Psychiatric: Mood, memory, affect and judgment normal.  Vitals reviewed.   LABORATORY DATA:  CBC    Component Value Date/Time   WBC 9.5 02/18/2017 1011   WBC 13.8 (H) 05/07/2016 1414   RBC 3.96 (L) 02/18/2017 1011   RBC 4.12 (L) 05/07/2016 1414   HGB 12.6 (L) 02/18/2017 1011   HCT 37.3 (L) 02/18/2017 1011   PLT 246 02/18/2017 1011   MCV 94.2 02/18/2017 1011   MCH 31.8 02/18/2017 1011   MCH 30.1 05/07/2016 1414   MCHC 33.7 02/18/2017 1011   MCHC 32.2  05/07/2016 1414   RDW 13.3 02/18/2017 1011   LYMPHSABS 1.6 02/18/2017 1011   MONOABS 0.6 02/18/2017 1011   EOSABS 0.2 02/18/2017 1011   BASOSABS 0.0 02/18/2017 1011    CMP     Component Value Date/Time   NA 140 02/18/2017 1011   K 4.5 02/18/2017 1011   CL 108 04/29/2016 1112   CO2 22 02/18/2017 1011   GLUCOSE 135 02/18/2017 1011   GLUCOSE 85 03/18/2006 0835   BUN 32.1 (H) 02/18/2017 1011   CREATININE 1.9 (H) 02/18/2017 1011   CALCIUM 9.8 02/18/2017 1011   PROT 6.9 02/18/2017 1011   ALBUMIN 3.7 02/18/2017 1011   AST 35 (H) 02/18/2017 1011   ALT 35 02/18/2017 1011   ALKPHOS 102 02/18/2017 1011   BILITOT 0.31 02/18/2017 1011   GFRNONAA 38 (L) 05/07/2016 1414   GFRAA 45 (L) 05/07/2016 1414    RADIOGRAPHIC STUDIES:  Ct Chest Wo Contrast  Result Date: 02/18/2017 CLINICAL DATA:  Restaging lung cancer. History of adenocarcinoma of the left upper lobe and September 2016 status post left upper lobe lobectomy. Remote history of prostate cancer status post prostatectomy. History of thyroid cancer in 1999 with thyroidectomy. EXAM: CT CHEST WITHOUT CONTRAST TECHNIQUE: Multidetector CT imaging of the chest was performed following the standard protocol without IV contrast. COMPARISON:  07/16/2016 FINDINGS: Cardiovascular: The heart is normal in size. No pericardial effusion. Mild tortuosity of the thoracic aorta with minimal calcifications. Minimal scattered coronary artery calcifications. Mediastinum/Nodes: Small scattered mediastinal and hilar lymph nodes but no mass or adenopathy. The esophagus is grossly normal. Lungs/Pleura: Stable surgical changes from a left upper lobe lobectomy. No findings for recurrent tumor. No worrisome pulmonary lesions or pulmonary nodules to suggest metastatic disease. Stable 4.5 mm left apical nodule on image number 32. Stable 3 mm subpleural nodular density in the left upper lung on image number 48. Stable right basilar scarring changes and subsegmental atelectasis  due to eventration of the right hemidiaphragm. No pleural effusions or infiltrates. No pulmonary edema. Upper Abdomen: No significant findings. No evidence of upper abdominal metastatic disease. Stable right renal calculus. Status post cholecystectomy. Musculoskeletal: Stable thoracolumbar fusion hardware. Stable compression fracture of T9 which has the appearance of [place]umm[place] disease. Compression fracture of L1 also again noted. IMPRESSION: 1. Status post left upper lobe lobectomy without findings for recurrent tumor, adenopathy or metastatic disease. 2. Stable small left upper lobe pulmonary nodules. 3. Stable thoracolumbar fusion hardware with  compression fractures of T9 and L1. Aortic Atherosclerosis (ICD10-I70.0). Electronically Signed   By: Marijo Sanes M.D.   On: 02/18/2017 14:55    ASSESSMENT and THERAPY PLAN:   Adenocarcinoma of left lung, stage 1 ([place]) This is a very pleasant 76 year old white male with a stage Ia non-small cell lung cancer diagnosed in September 2016 status post left upper lobectomy and has been observation since that time. The patient is doing fine with no specific complaints.  The patient was seen with Dr. Julien Nordmann. His most recent CT scan of the chest in September 2018 showed no evidence for disease recurrence. Scan results were discussed with the patient today. Recommended for him to continue on observation with repeat CT scan of the chest without contrast in one year.  For the renal insufficiency, he will continue his routine follow-up visit and evaluation by his primary care physician. He was advised to call immediately if he has any concerning symptoms in the interval. The patient voices understanding of current disease status and treatment options and is in agreement with the current care plan.   Orders Placed This Encounter  Procedures  . CT Chest Wo Contrast    Standing Status:   Future    Standing Expiration Date:   08/26/2018    Order Specific Question:    Preferred imaging location?    Answer:   [facility]    Order Specific Question:   Radiology Contrast Protocol - do NOT remove file path    Answer:   [email or web address]    Order Specific Question:   Reason for Exam additional comments    Answer:   lung cancer. Restaging.  Marland Kitchen CBC with Differential/Platelet    Standing Status:   Future    Standing Expiration Date:   08/26/2018  . Comprehensive metabolic panel    Standing Status:   Future    Standing Expiration Date:   08/26/2018    All questions were answered. The patient knows to call the clinic with any problems, questions or concerns. We can certainly see the patient much sooner if necessary.  Mikey Bussing, NP 02/25/2017   ADDENDUM: Hematology/Oncology Attending: I had a face to face encounter with the patient. I recommended his care plan. This is a very pleasant 76 years old white male with stage IA non-small cell lung cancer status post left upper lobectomy with lymph node dissection in September 2016. The patient has been on observation since that time. The recent CT scan of the chest showed no evidence for disease recurrence. I discussed the scan results with the patient and his wife today. I recommended for him to continue on observation with repeat CT scan of the chest in one year. He was advised to call immediately if he has any concerning symptoms in the interval.  Disclaimer: This note was dictated with voice recognition software. Similar sounding words can inadvertently be transcribed and may be missed upon review. Eilleen Kempf, MD 02/26/17

## 2017-02-25 NOTE — Assessment & Plan Note (Signed)
This is a very pleasant 76 year old white male with a stage Ia non-small cell lung cancer diagnosed in September 2016 status post left upper lobectomy and has been observation since that time. The patient is doing fine with no specific complaints.  The patient was seen with Dr. Julien Nordmann. His most recent CT scan of the chest in September 2018 showed no evidence for disease recurrence. Scan results were discussed with the patient today. Recommended for him to continue on observation with repeat CT scan of the chest without contrast in one year.  For the renal insufficiency, he will continue his routine follow-up visit and evaluation by his primary care physician. He was advised to call immediately if he has any concerning symptoms in the interval. The patient voices understanding of current disease status and treatment options and is in agreement with the current care plan.

## 2017-02-26 DIAGNOSIS — K5090 Crohn's disease, unspecified, without complications: Secondary | ICD-10-CM | POA: Diagnosis not present

## 2017-02-26 DIAGNOSIS — Z6832 Body mass index (BMI) 32.0-32.9, adult: Secondary | ICD-10-CM | POA: Diagnosis not present

## 2017-02-26 DIAGNOSIS — I10 Essential (primary) hypertension: Secondary | ICD-10-CM | POA: Diagnosis not present

## 2017-02-26 DIAGNOSIS — Z23 Encounter for immunization: Secondary | ICD-10-CM | POA: Diagnosis not present

## 2017-02-26 DIAGNOSIS — E1122 Type 2 diabetes mellitus with diabetic chronic kidney disease: Secondary | ICD-10-CM | POA: Diagnosis not present

## 2017-02-26 DIAGNOSIS — M19079 Primary osteoarthritis, unspecified ankle and foot: Secondary | ICD-10-CM | POA: Diagnosis not present

## 2017-02-26 DIAGNOSIS — E038 Other specified hypothyroidism: Secondary | ICD-10-CM | POA: Diagnosis not present

## 2017-02-26 DIAGNOSIS — C61 Malignant neoplasm of prostate: Secondary | ICD-10-CM | POA: Diagnosis not present

## 2017-03-08 ENCOUNTER — Other Ambulatory Visit: Payer: Self-pay | Admitting: Physician Assistant

## 2017-03-08 DIAGNOSIS — IMO0001 Reserved for inherently not codable concepts without codable children: Secondary | ICD-10-CM

## 2017-03-08 DIAGNOSIS — E1129 Type 2 diabetes mellitus with other diabetic kidney complication: Principal | ICD-10-CM

## 2017-03-08 DIAGNOSIS — Z794 Long term (current) use of insulin: Principal | ICD-10-CM

## 2017-03-10 ENCOUNTER — Ambulatory Visit: Payer: Medicare Other

## 2017-03-10 ENCOUNTER — Other Ambulatory Visit: Payer: Self-pay

## 2017-03-10 ENCOUNTER — Encounter: Payer: Self-pay | Admitting: Podiatry

## 2017-03-10 ENCOUNTER — Ambulatory Visit (INDEPENDENT_AMBULATORY_CARE_PROVIDER_SITE_OTHER): Payer: Medicare Other | Admitting: Podiatry

## 2017-03-10 DIAGNOSIS — E0842 Diabetes mellitus due to underlying condition with diabetic polyneuropathy: Secondary | ICD-10-CM | POA: Diagnosis not present

## 2017-03-10 DIAGNOSIS — B351 Tinea unguium: Secondary | ICD-10-CM

## 2017-03-10 DIAGNOSIS — M79676 Pain in unspecified toe(s): Secondary | ICD-10-CM

## 2017-03-10 DIAGNOSIS — M79673 Pain in unspecified foot: Secondary | ICD-10-CM

## 2017-03-10 NOTE — Progress Notes (Signed)
    SUBJECTIVE Patient with a history of diabetes mellitus presents to office today complaining of elongated, thickened nails. Pain while ambulating in shoes. Patient is unable to trim their own nails.   Past Medical History:  Diagnosis Date  . Adenocarcinoma of left lung, stage 1 ([place]) 03/10/2015  . Ankylosing spondylitis ([place])    Diagnosed during lumbar fracture summer of 2016    . Crohn's disease ([place])   . Gout   . HIstory of basal cell cancer of face    THYROID CA HX  . Hypertension   . Hypocalcemia 04/13/2007  . Hypothyroidism   . Impotence   . 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 Obesity (BMI 30-39.9)   . Osteoporosis    Pt completed 5 years of fosamax in 2013     . Prostate cancer with recurrence    Treated with prostatectomy with recurrence 2012 with Lupron treatment now him   . Psoriasis   . Thyroid cancer ([place]) 1999   Treated with RAI and total thyroidectomy   . Type II diabetes mellitus ([place])     OBJECTIVE General Patient is awake, alert, and oriented x 3 and in no acute distress. Derm Skin is dry and supple bilateral. Negative open lesions or macerations. Remaining integument unremarkable. Nails are tender, long, thickened and dystrophic with subungual debris, consistent with onychomycosis, 1-5 bilateral. No signs of infection noted. Vasc  DP and PT pedal pulses palpable bilaterally. Temperature gradient within normal limits.  Neuro Epicritic and protective threshold sensation diminished bilaterally.  Musculoskeletal Exam No symptomatic pedal deformities noted bilateral. Muscular strength within normal limits.  ASSESSMENT 1. Diabetes Mellitus w/ peripheral neuropathy 2. Onychomycosis of nail due to dermatophyte bilateral 3. Pain in foot bilateral  PLAN OF CARE 1. Patient evaluated today. 2. Instructed to maintain good pedal hygiene and foot care. Stressed importance of controlling blood sugar.  3. Mechanical debridement of nails 1-5  bilaterally performed using a nail nipper. Filed with dremel without incident.  4. Return to clinic in 3 mos.     Edrick Kins, DPM [facility]  Dr. Edrick Kins, [address]                                        [place], [place] [address]                Office [phone]  Fax [phone]

## 2017-03-10 NOTE — Telephone Encounter (Signed)
Please advise 

## 2017-03-11 NOTE — Telephone Encounter (Signed)
Please contact patient - I believe he is being followed by Dr Dagmar Hait who should be doing this for the patient.

## 2017-03-11 NOTE — Telephone Encounter (Signed)
Spoke with patient he uses Dr. Elsworth Soho

## 2017-04-20 IMAGING — CT CT BIOPSY
2 of 4 series · 13 of 32 positions shown, 19 images · non-contrast
Comparison: none

CLINICAL DATA: 74-year-old male with incidentally detected left
upper lobe pulmonary nodule on prior CT scan of the thoracic spine.
TECHNIQUE: Informed consent was obtained from the patient following explanation
of the procedure, risks, benefits and alternatives. The patient
understands, agrees and consents for the procedure. All questions
were addressed. A time out was performed.

[Series 2: i-spiral 5.0 b40f · axial · 0.98mm/px · z∈[+1346,+1556]mm · 11 of 74 slices shown, 17 images]
[im 7/74  soft-tissue]
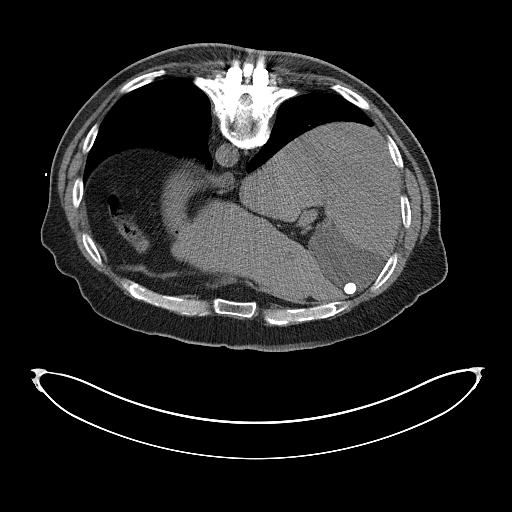
[im 7/74  bone]
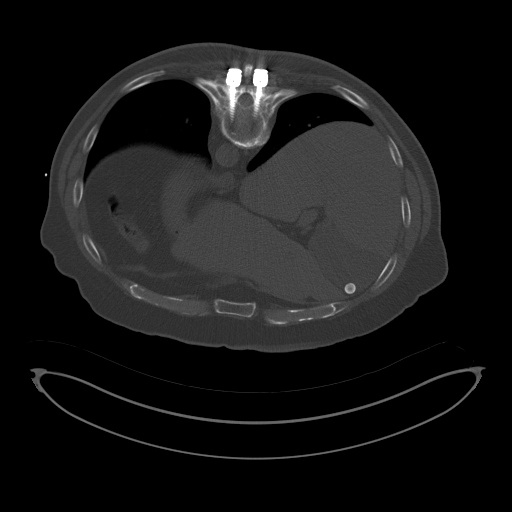
[im 13/74  soft-tissue]
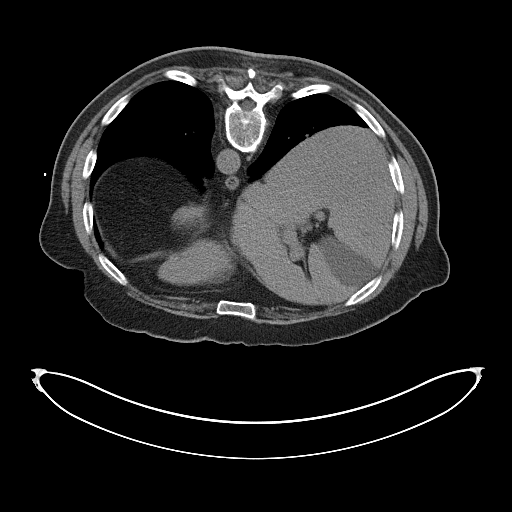
[im 19/74  soft-tissue]
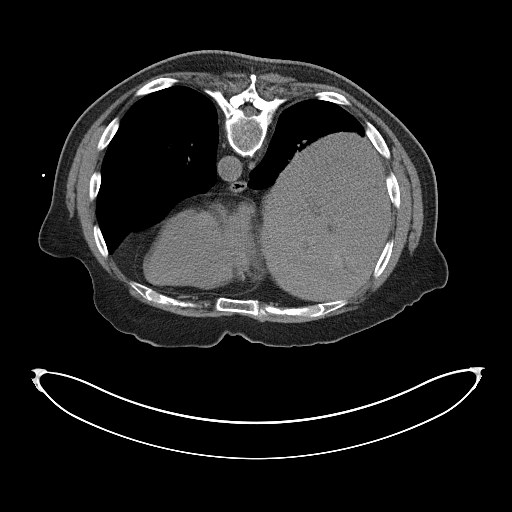
[im 25/74  soft-tissue]
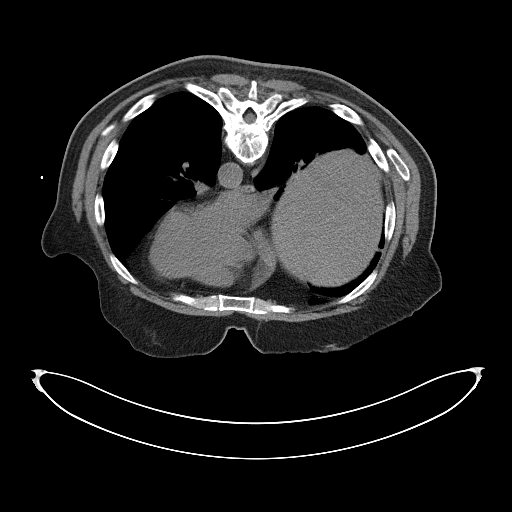
[im 31/74  soft-tissue]
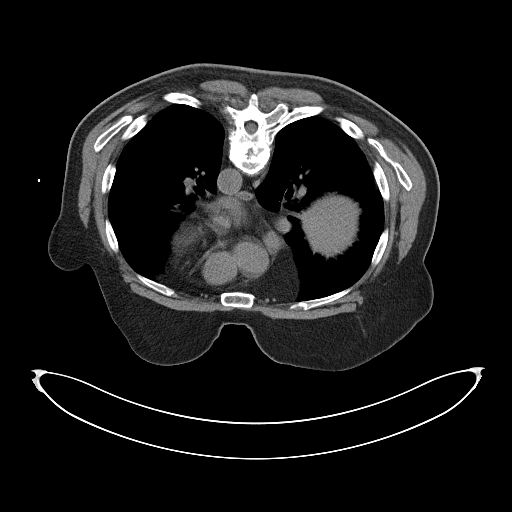
[im 37/74  soft-tissue]
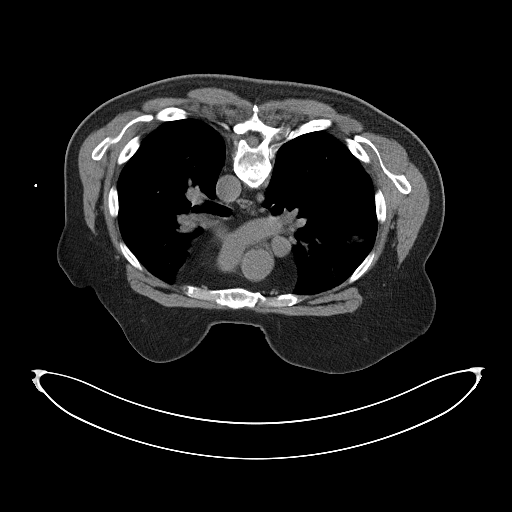
[im 43/74  soft-tissue]
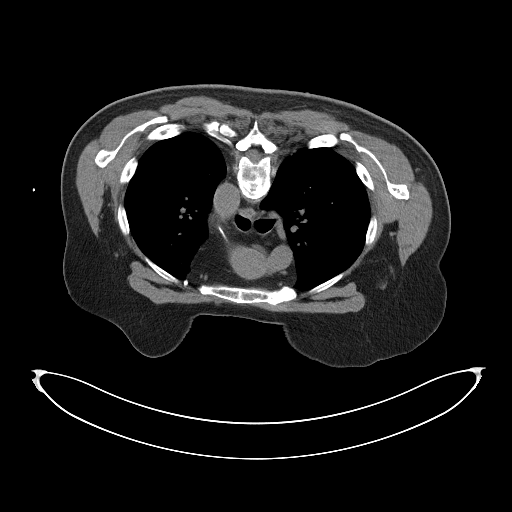
[im 49/74  soft-tissue]
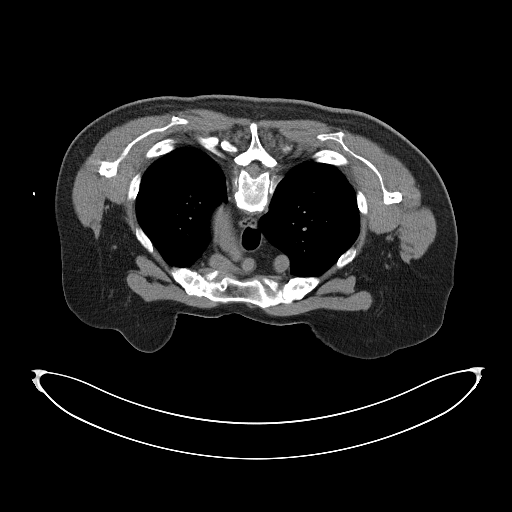
[im 49/74  lung]
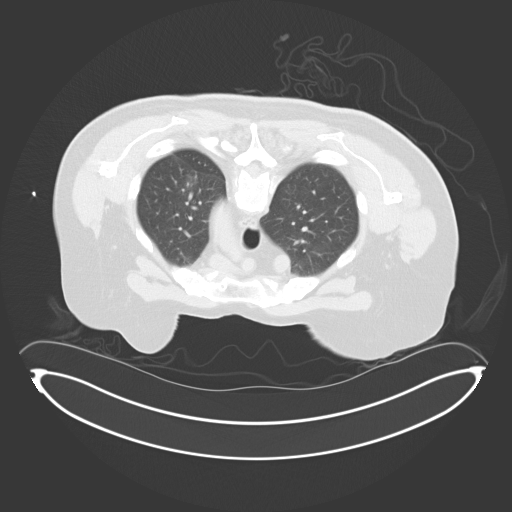
[im 55/74  soft-tissue]
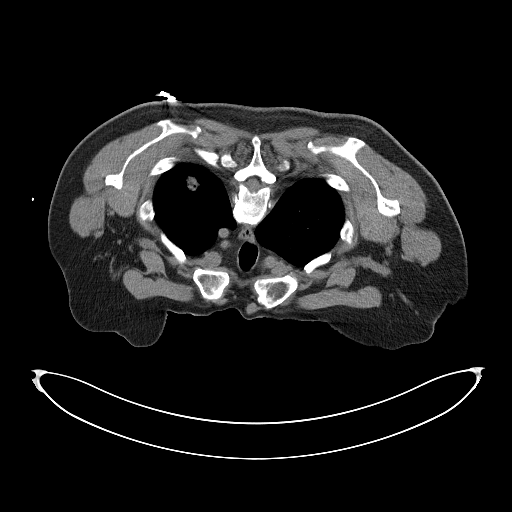
[im 55/74  lung]
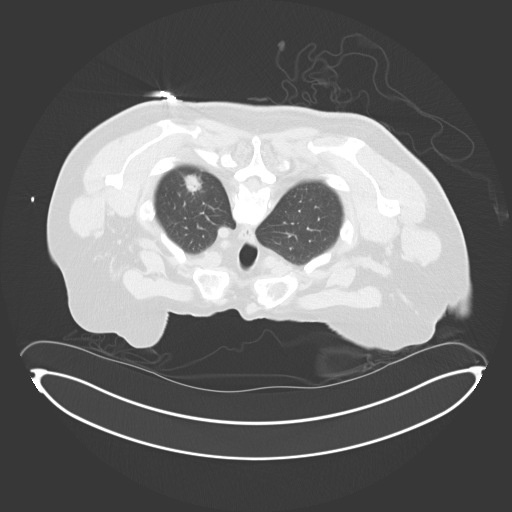
[im 55/74  bone]
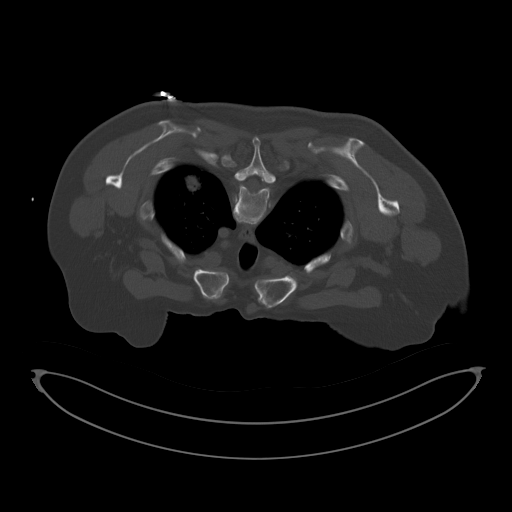
[im 61/74  soft-tissue]
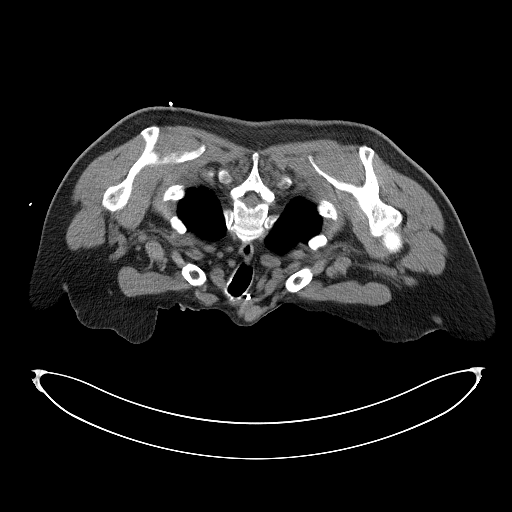
[im 61/74  lung]
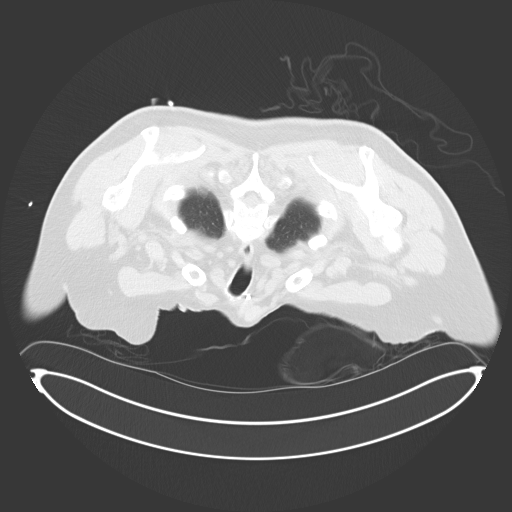
[im 67/74  soft-tissue]
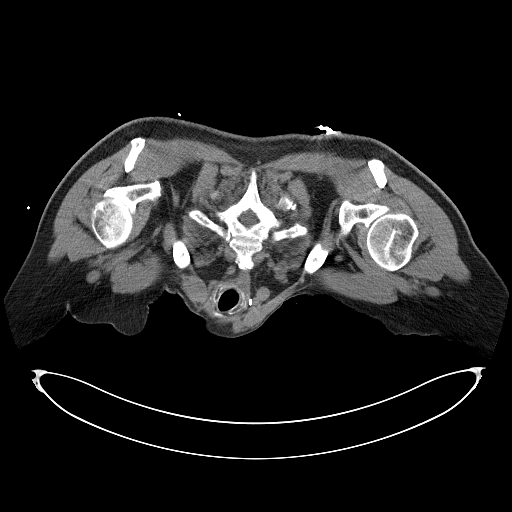
[im 67/74  lung]
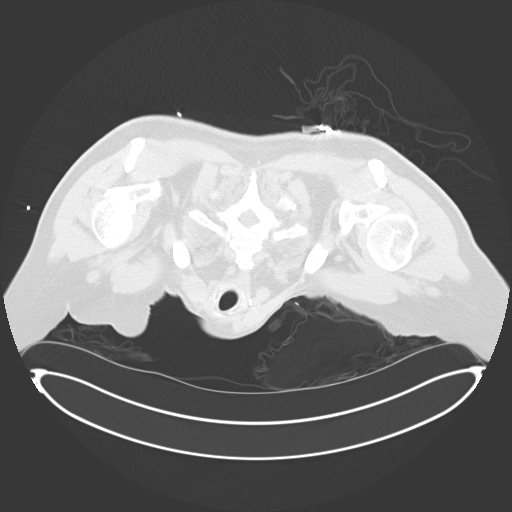

[Series 4: i-sequence 4.8 b40s · axial · 0.98mm/px · z∈[+1577,+1581]mm · 2 of 66 slices shown]
[im 7/66  soft-tissue]
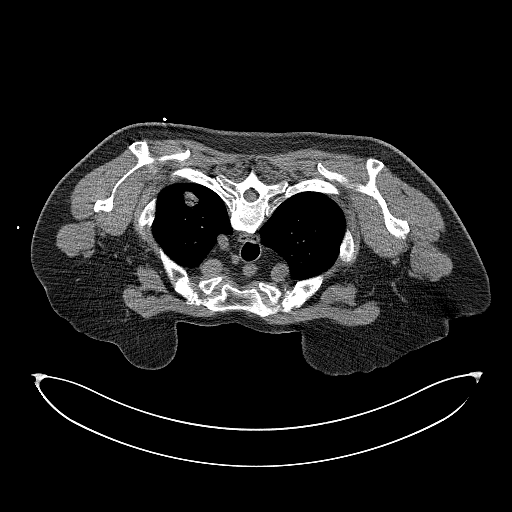
[im 14/66  soft-tissue]
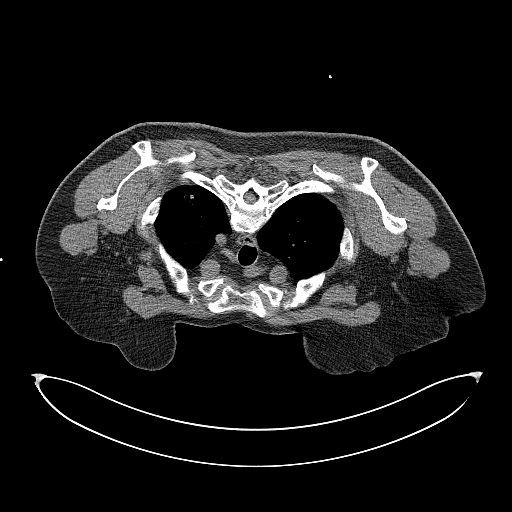

[13 of 32 positions shown; findings below may reference images not displayed]

EXAM:
CT BIOPSY

Date: 02/16/2015

PROCEDURE:
1. CT-guided biopsy left upper lobe pulmonary nodule

ANESTHESIA/SEDATION:
Moderate (conscious) sedation was used. 1 mg Versed, 50 mcg Fentanyl
were administered intravenously. The patient's vital signs were
monitored continuously by radiology nursing throughout the
procedure.

Sedation Time: 21 minutes

MEDICATIONS:
None additional
A planning axial CT scan was performed. The nodule in the left upper
lobe was successfully identified. A suitable skin entry site was
selected and marked. The region was then sterilely prepped and
draped in standard fashion using Betadine skin prep. Local
anesthesia was attained by infiltration with 1% lidocaine. A small
dermatotomy was made. Under intermittent CT fluoroscopic guidance, a
17 gauge trocar needle was advanced into the lung and positioned at
the margin of the nodule.

Multiple 18 gauge core biopsies were then coaxially obtained using
the BioPince automated biopsy device. Biopsy specimens were placed
in formalin and delivered to pathology for further analysis. The
biopsy device and introducer needle were removed. Post biopsy axial
CT imaging demonstrates no evidence of immediate complication. There
is no pneumothorax. Mild perilesional alveolar hemorrhage is not
unexpected. The bio centri device was employed. The patient
tolerated the procedure well.

COMPLICATIONS:
None

Estimated blood loss:  0
IMPRESSION: Technically successful CT-guided biopsy left upper lobe pulmonary
nodule.

## 2017-04-20 IMAGING — CR DG CHEST 1V
1 series · 1 of 1 positions shown · non-contrast
Comparison: CT chest biopsy images of 02/16/2015 and chest x-ray of
12/16/2014

CLINICAL DATA: Post left lung biopsy

EXAM:
CHEST  1 VIEW

[w chest pa]
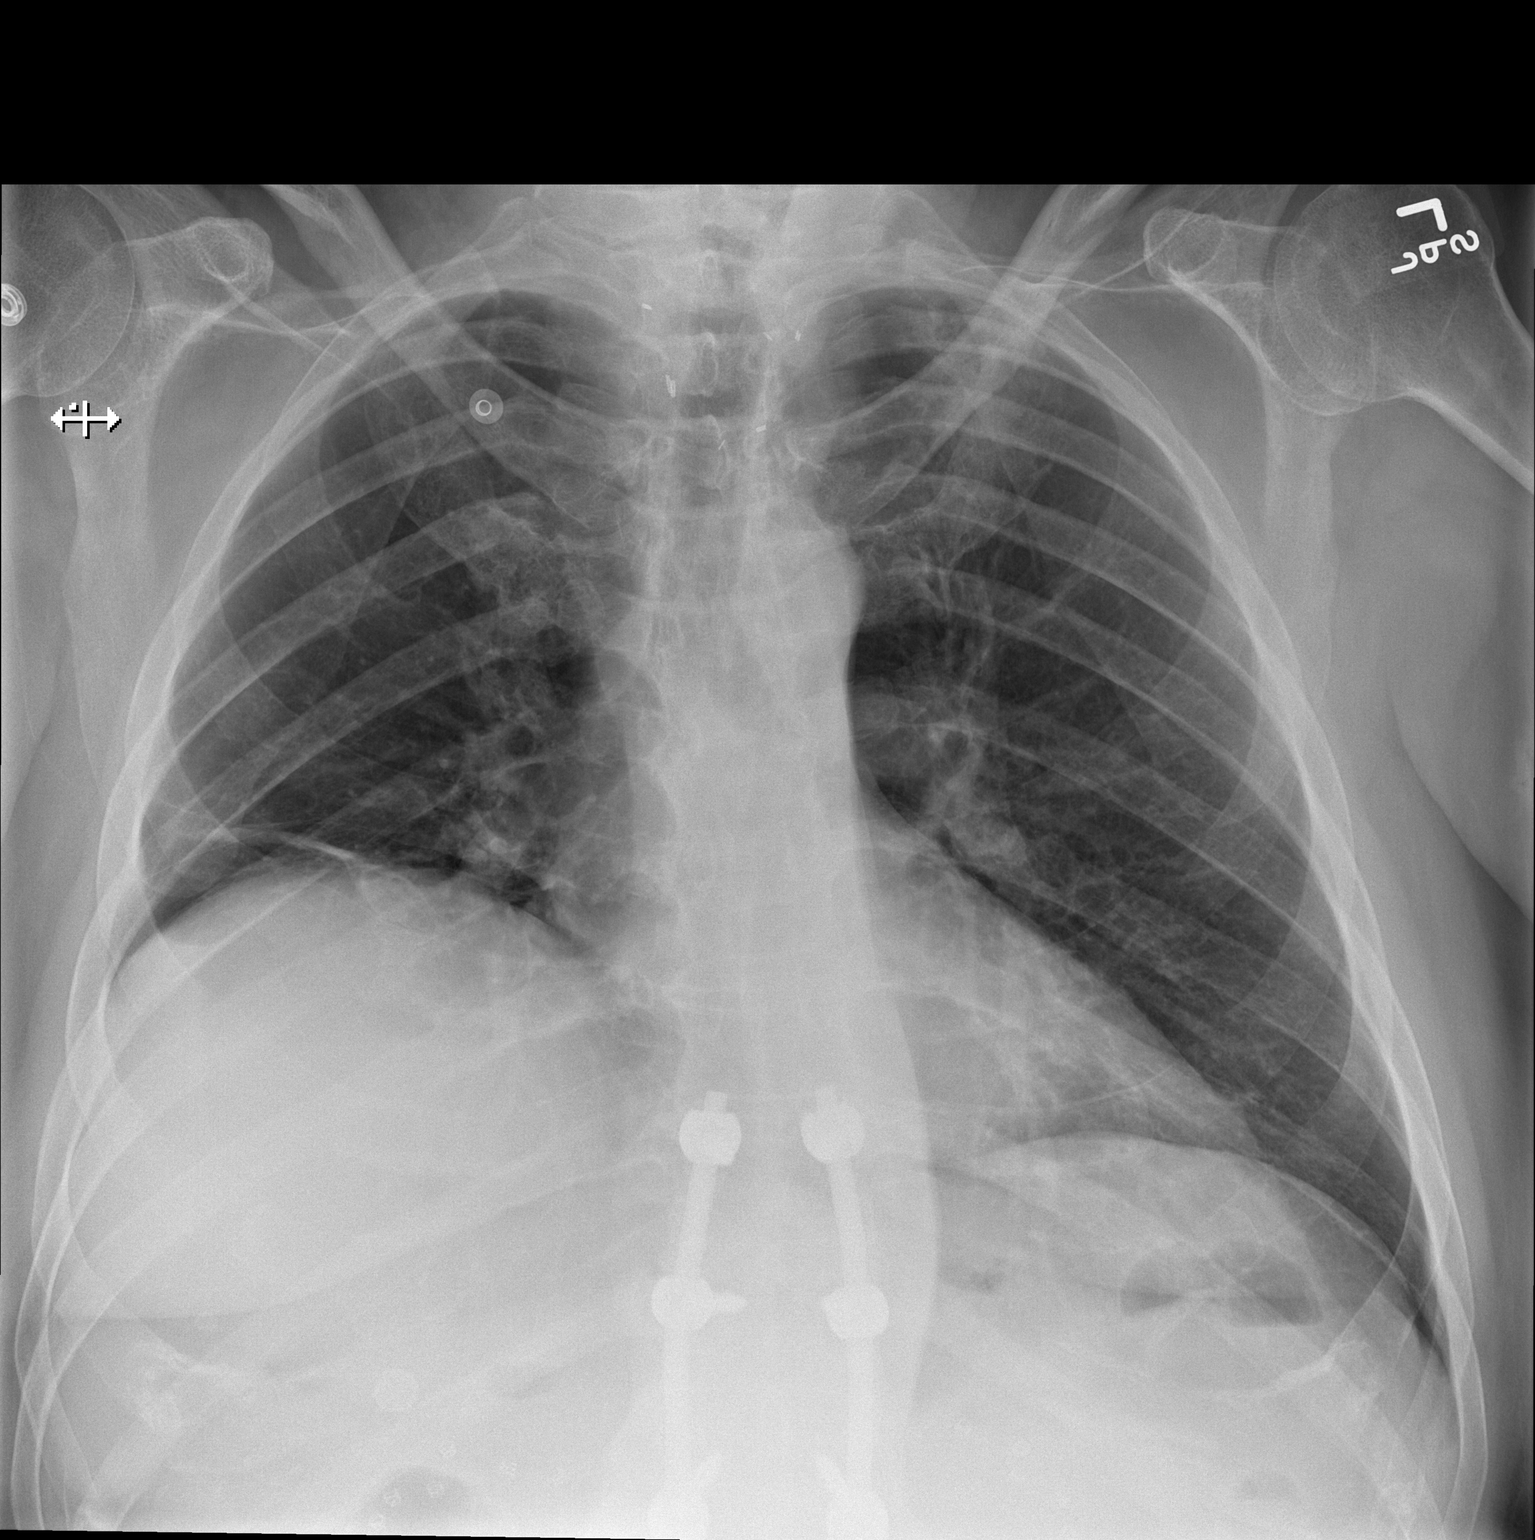

[1 of 1 positions shown; findings below may reference images not displayed]

FINDINGS: The nodular lesion in the left upper lobe near the apex is not well
seen by plain film. However post biopsy, there is vague lucency
medially adjacent to the left heart border and a tiny left
pneumothorax cannot be excluded. No pleural effusion is seen. There
is right basilar linear atelectasis or scarring with chronic
elevation of the right hemidiaphragm noted.
IMPRESSION: Post biopsy there may be a tiny left pneumothorax. No effusion is
seen.

## 2017-04-30 DIAGNOSIS — C61 Malignant neoplasm of prostate: Secondary | ICD-10-CM | POA: Diagnosis not present

## 2017-05-07 DIAGNOSIS — Z5111 Encounter for antineoplastic chemotherapy: Secondary | ICD-10-CM | POA: Diagnosis not present

## 2017-05-07 DIAGNOSIS — C61 Malignant neoplasm of prostate: Secondary | ICD-10-CM | POA: Diagnosis not present

## 2017-05-07 DIAGNOSIS — C7951 Secondary malignant neoplasm of bone: Secondary | ICD-10-CM | POA: Diagnosis not present

## 2017-05-15 IMAGING — CT CT HEAD WO/W CM
1 of 2 series · 13 of 30 positions shown, 17 images · IV contrast (isovue)
Comparison: No comparison head CT

CLINICAL DATA: 74-year-old male with lung cancer. Staging. Initial
encounter.

EXAM:
CT HEAD WITHOUT AND WITH CONTRAST
TECHNIQUE: Contiguous axial images were obtained from the base of the skull
through the vertex without and with intravenous contrast
CONTRAST:  75 cc Isovue 300

[Series 3: head w/(date) · axial · 0.49mm/px · z∈[-148,-33]mm · 13 of 27 slices shown, 17 images]
[im 2/27  brain]
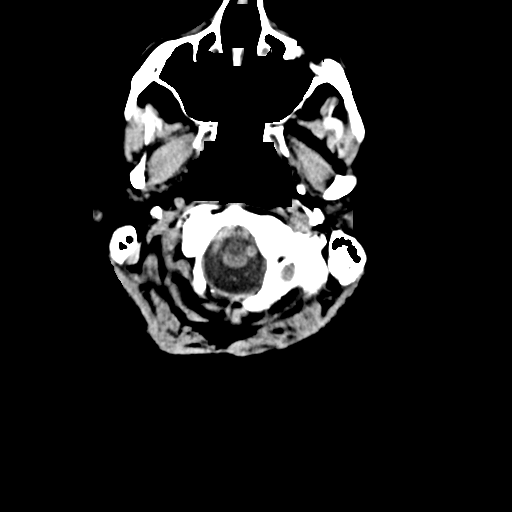
[im 2/27  bone]
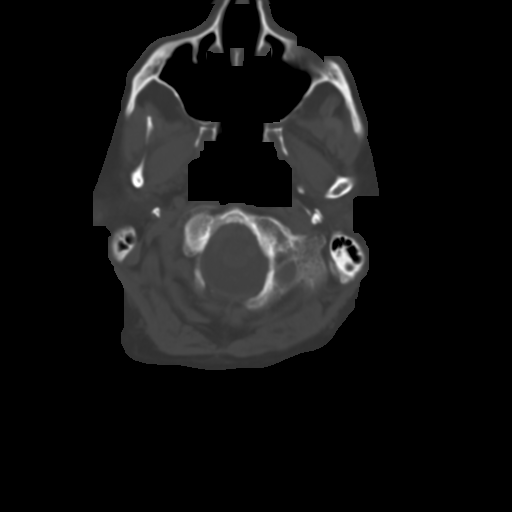
[im 4/27  brain]
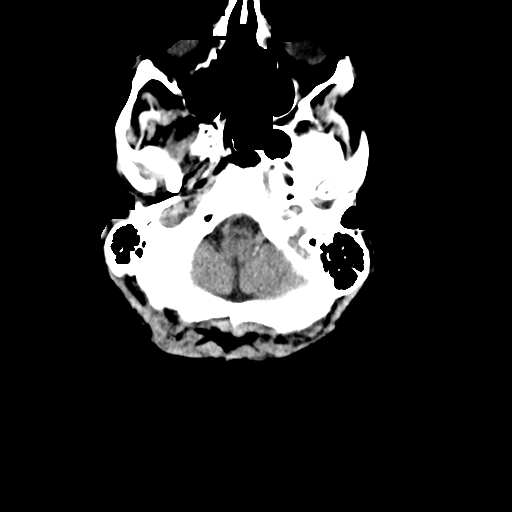
[im 6/27  brain]
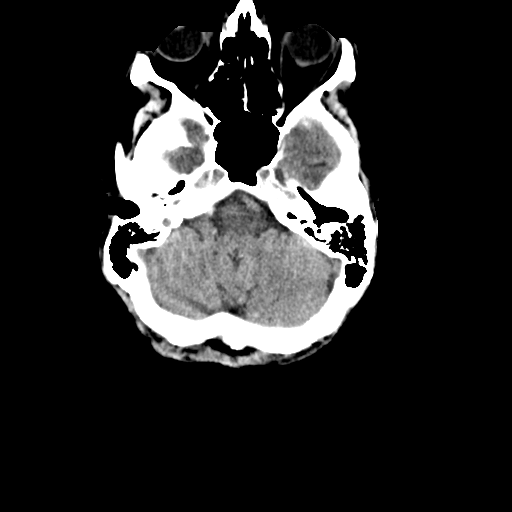
[im 8/27  brain]
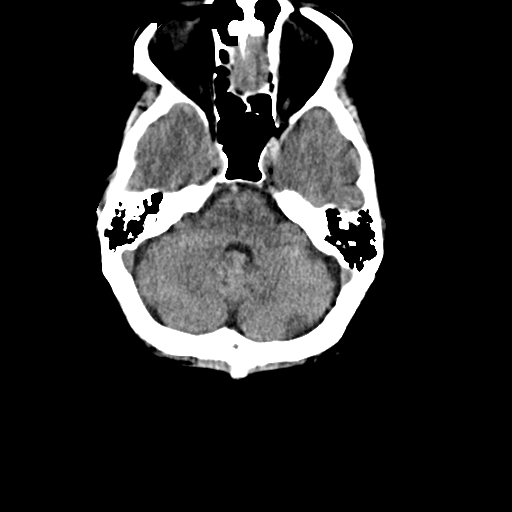
[im 10/27  brain]
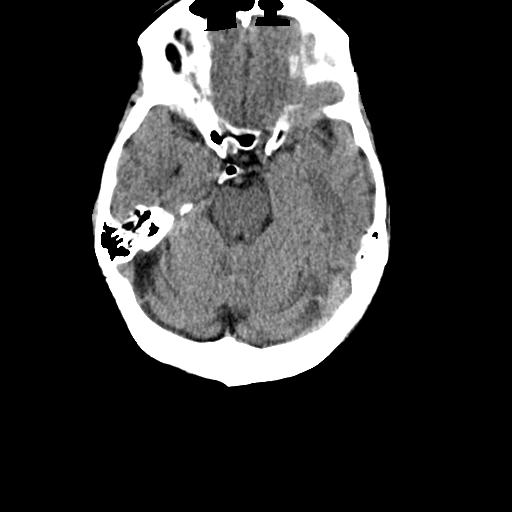
[im 10/27  bone]
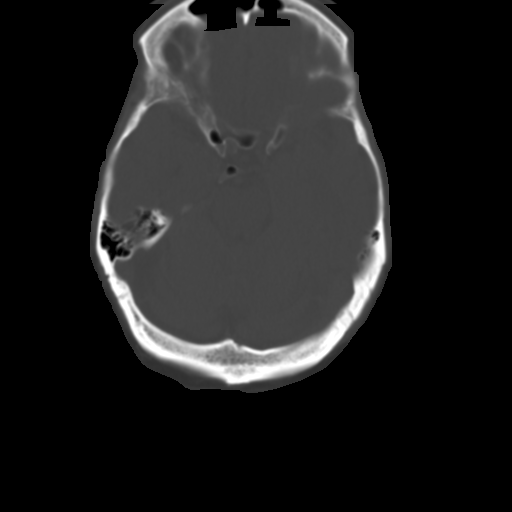
[im 12/27  brain]
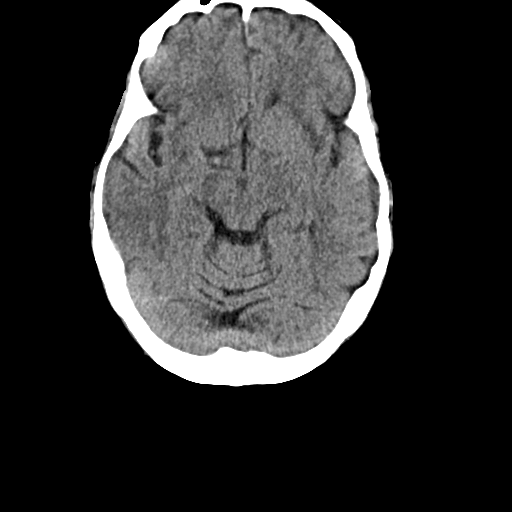
[im 14/27  brain]
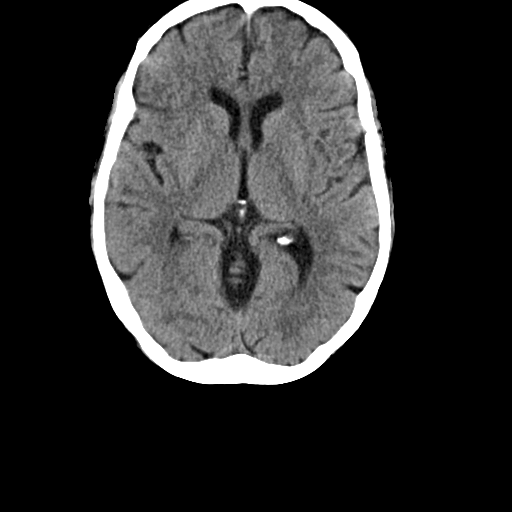
[im 15/27  brain]
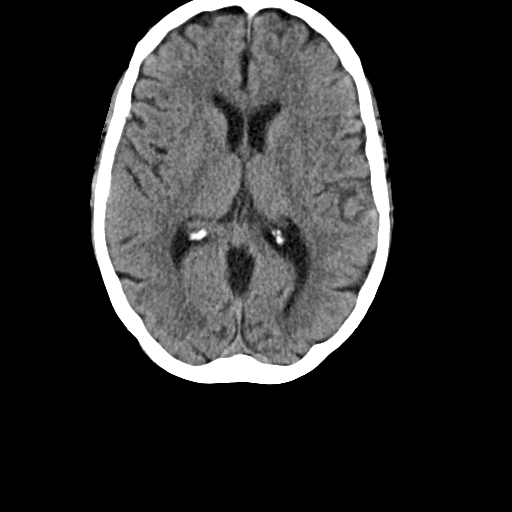
[im 17/27  brain]
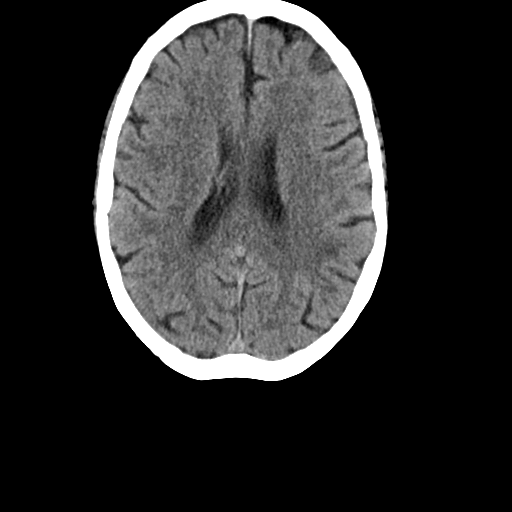
[im 17/27  bone]
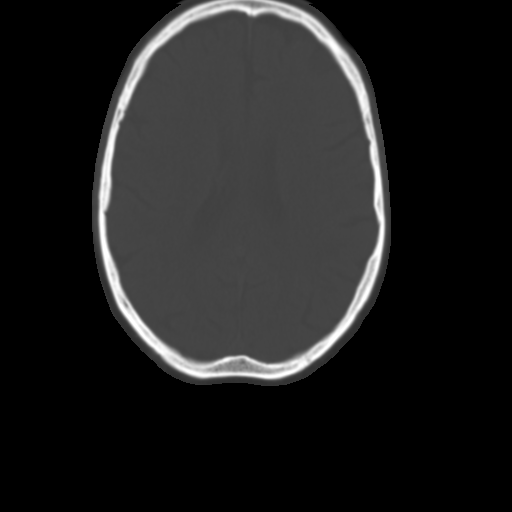
[im 19/27  brain]
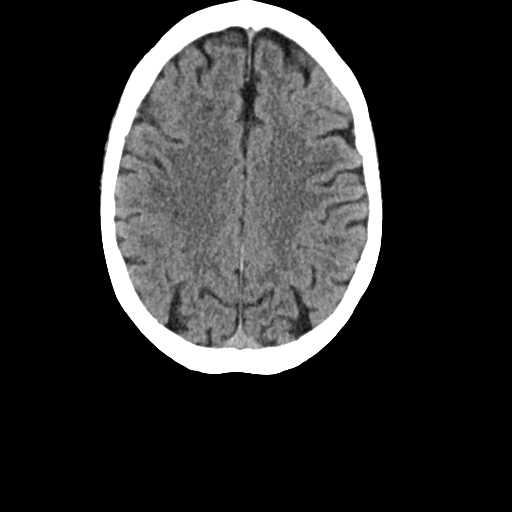
[im 21/27  brain]
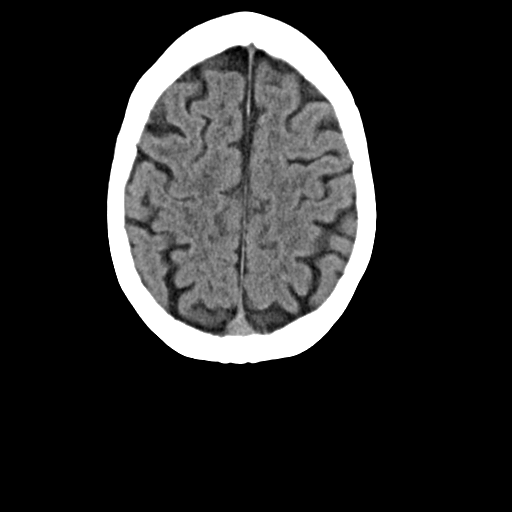
[im 23/27  brain]
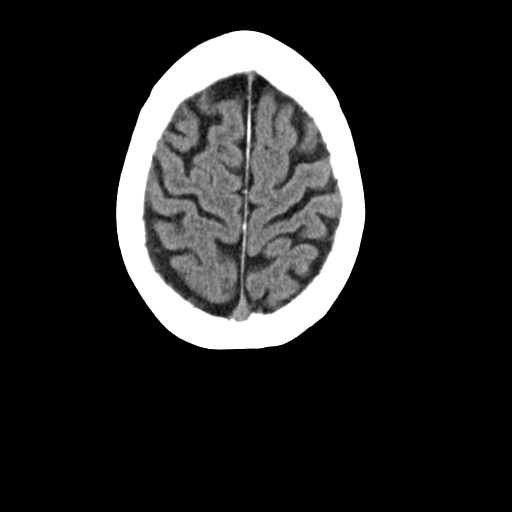
[im 25/27  brain]
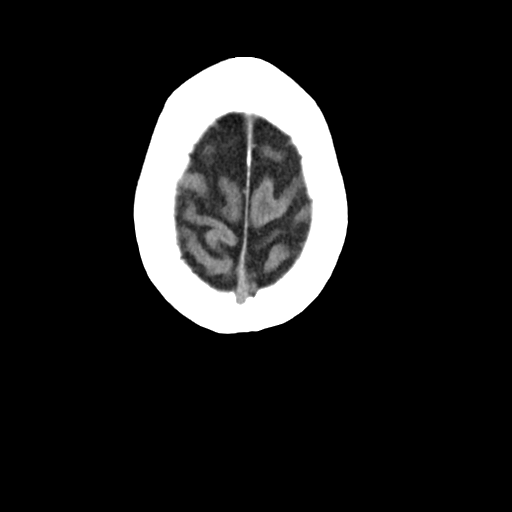
[im 25/27  bone]
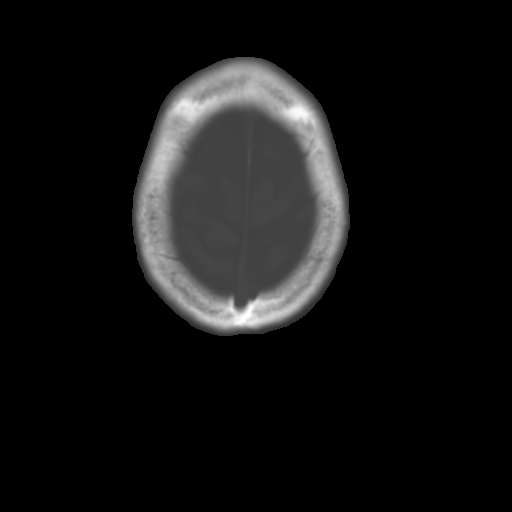

[13 of 30 positions shown; findings below may reference images not displayed]

FINDINGS: No intracranial enhancing lesion or bony destructive lesion to
suggest presence of intracranial metastatic disease.

Mild small vessel disease type changes without CT evidence of large
acute infarct.

No intracranial hemorrhage.

No hydrocephalus.

Visualized orbital structures unremarkable.
IMPRESSION: No evidence of intracranial metastatic disease.

## 2017-05-16 IMAGING — CR DG CHEST 2V
2 series · 2 of 2 positions shown · non-contrast
Comparison: 03/08/2015

CLINICAL DATA: Preoperative evaluation for left upper lobectomy

EXAM:
CHEST - 2 VIEW

[w chest pa]
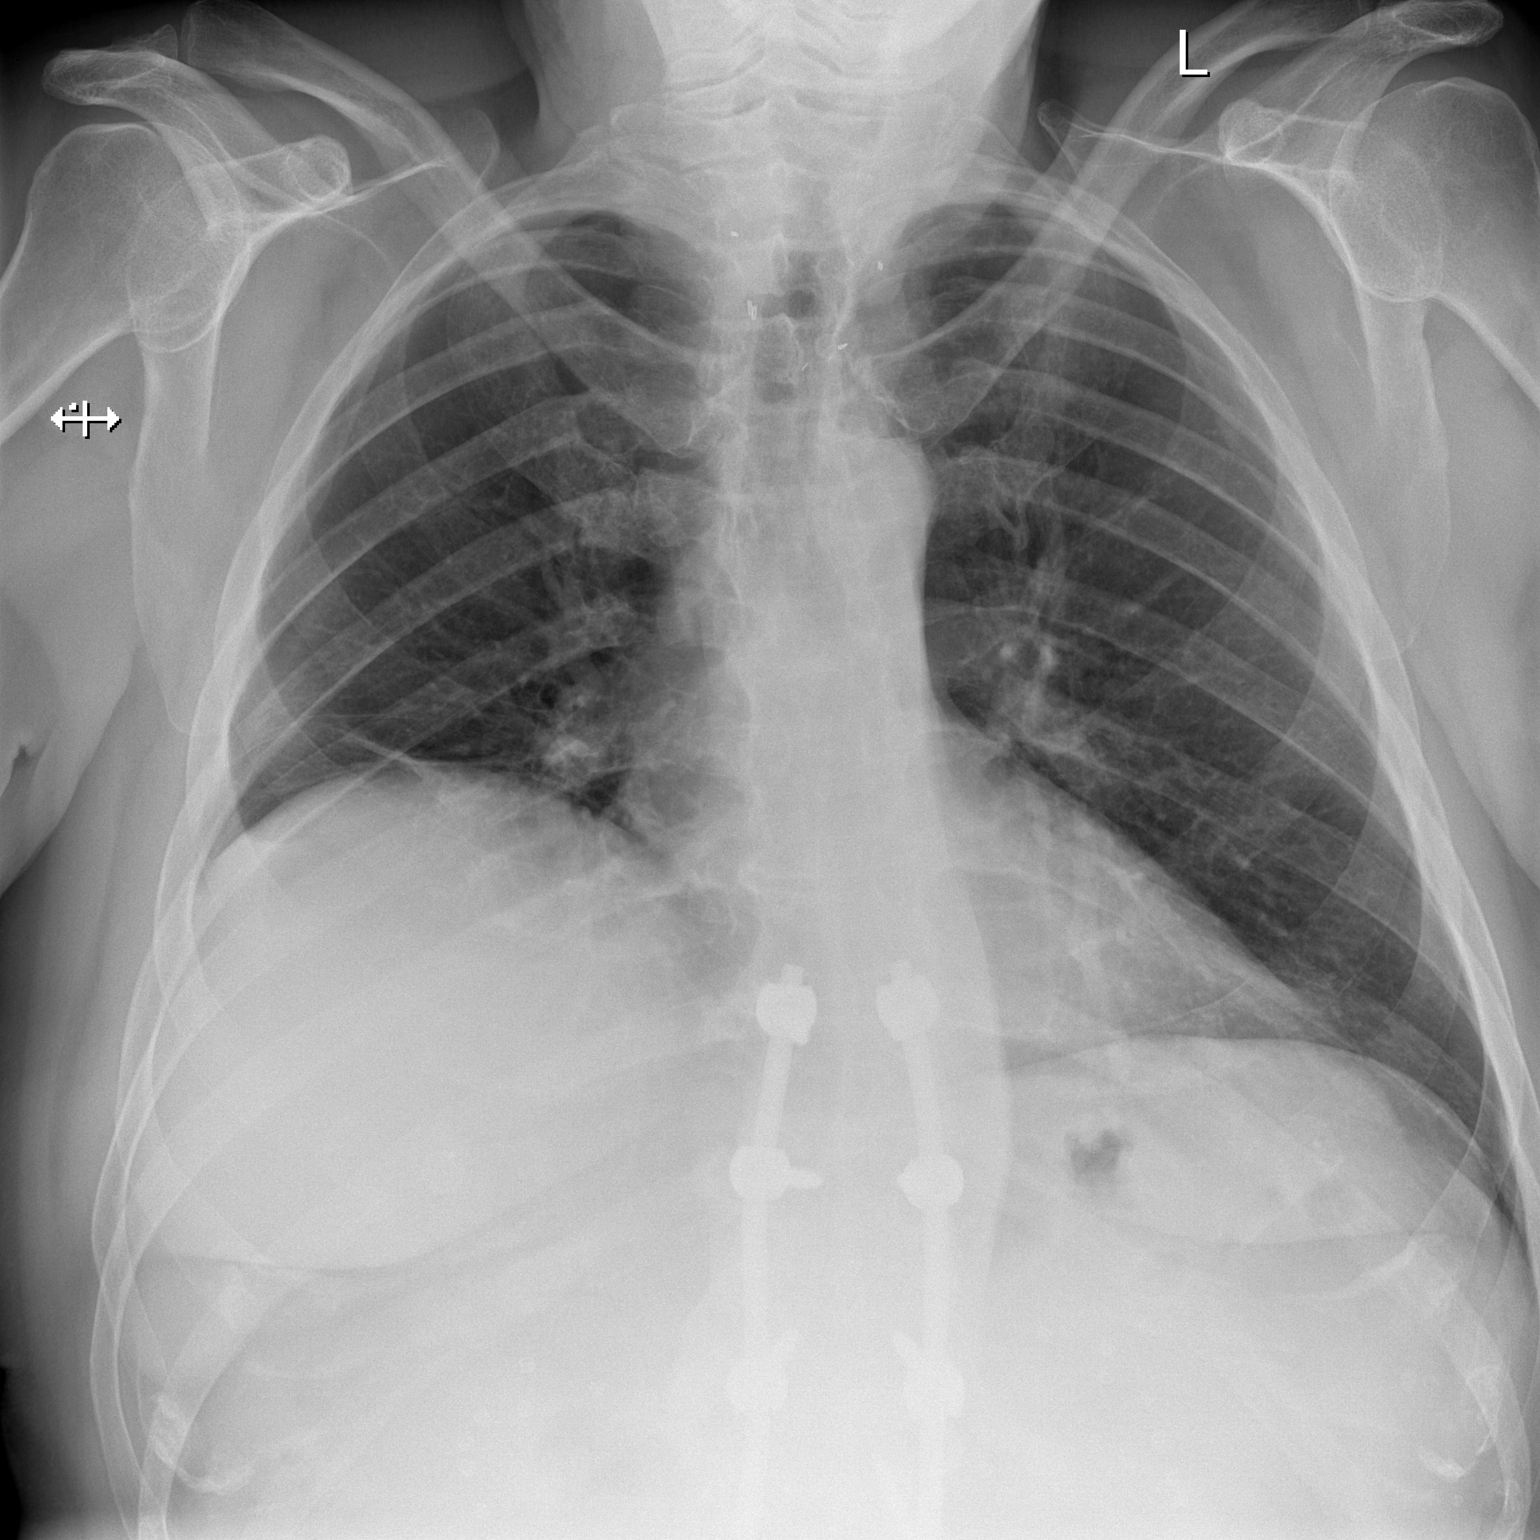

[w chest lat]
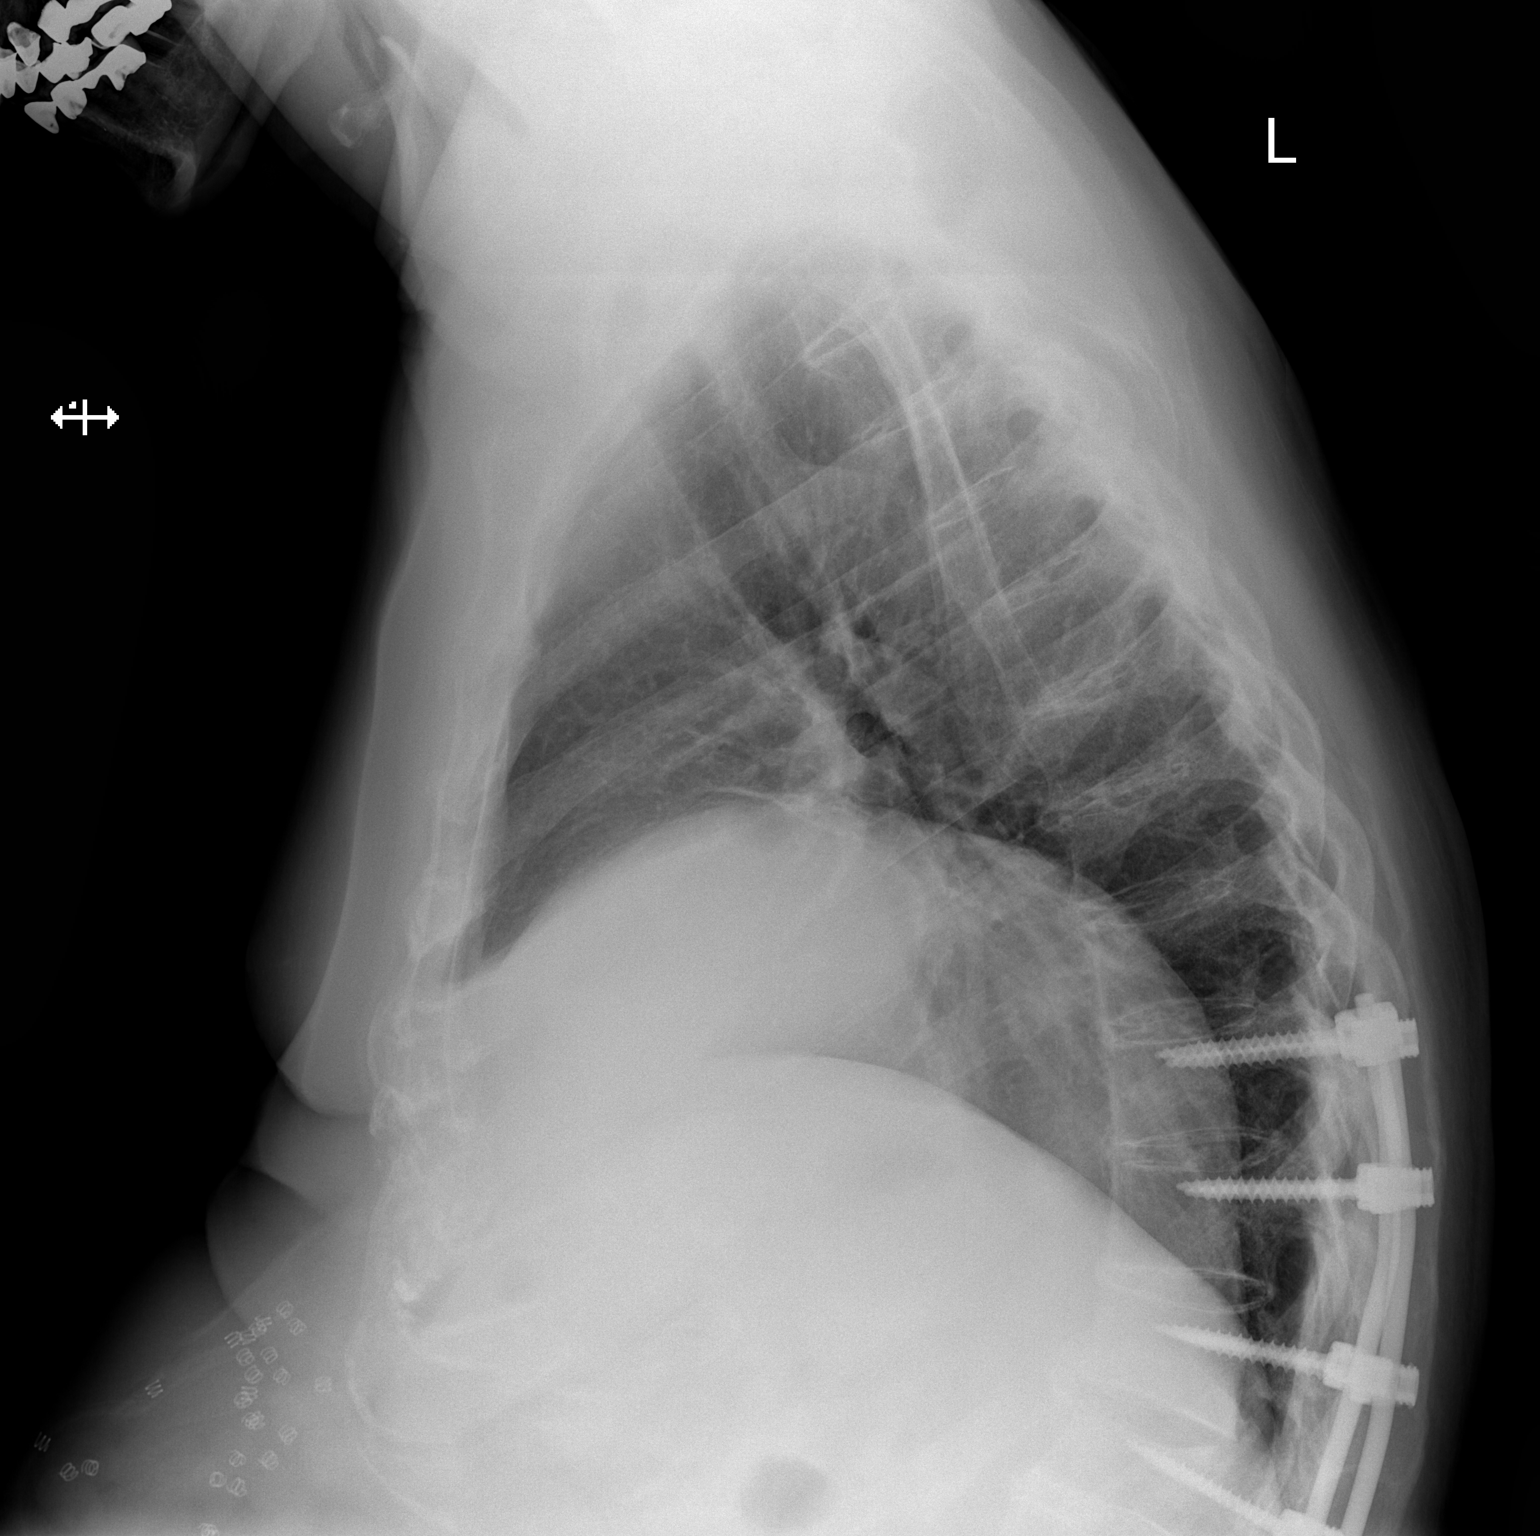

[2 of 2 positions shown; findings below may reference images not displayed]

FINDINGS: Cardiac shadow is stable. Elevation of the right hemidiaphragm is
again seen. Postsurgical changes in the thoracolumbar spine are
again noted. Vague increased density is noted in the left apex
consistent with the known mass lesion. No other focal abnormality is
seen.
IMPRESSION: No acute abnormality noted.

Stable left upper lobe mass

## 2017-05-18 IMAGING — CR DG CHEST 1V PORT
1 series · 1 of 1 positions shown · non-contrast
Comparison: March 14, 2015

CLINICAL DATA: Status post lobectomy

EXAM:
PORTABLE CHEST 1 VIEW

[AP]
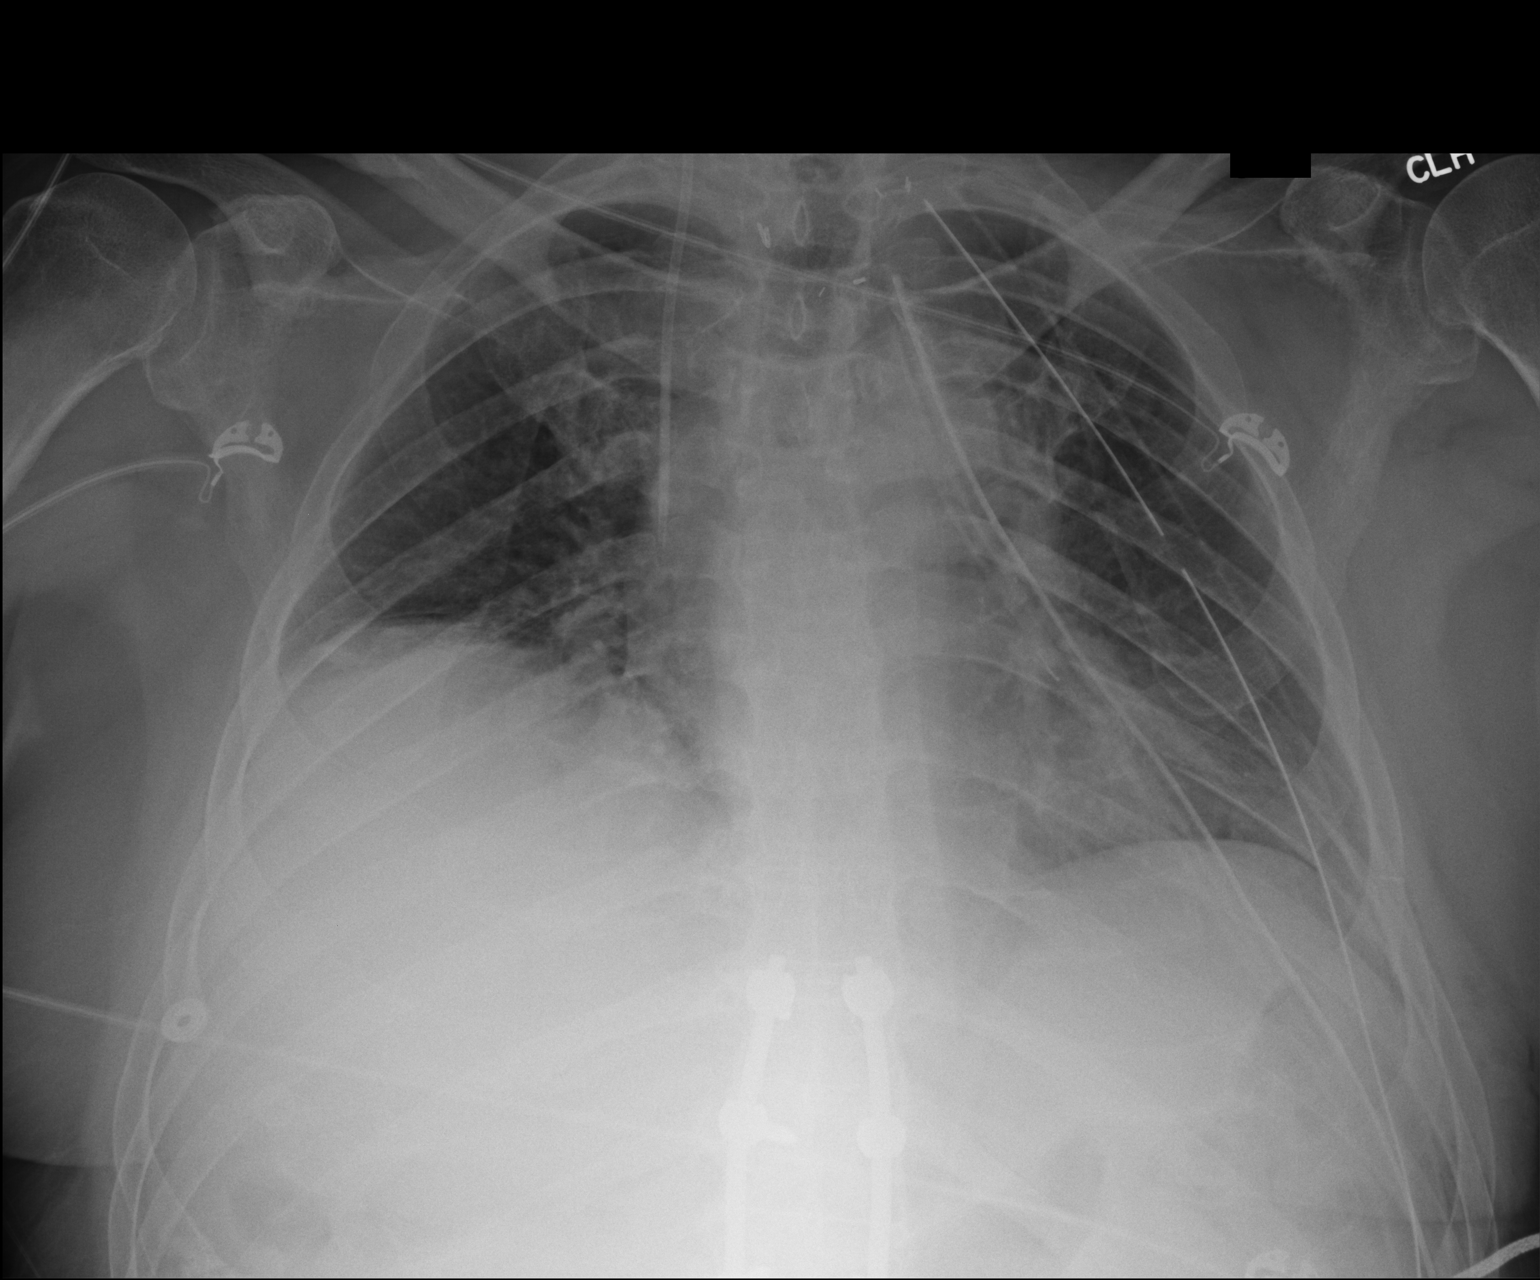

[1 of 1 positions shown; findings below may reference images not displayed]

FINDINGS: The heart size and mediastinal contours are stable. There is mild
atelectasis of the right lung base. A right jugular central venous
line is identified with distal tip in superior vena cava. Two
left-sided chest tubes are identified with the tips over the left
apex. No definite pleural line is identified to suggest
pneumothorax. The visualized skeletal structures are stable.
IMPRESSION: Two left chest tubes are identified with the tips over the left
apex. No definite pneumothorax is identified. Mild atelectasis of
right lung base.

## 2017-05-19 IMAGING — CR DG CHEST 1V PORT
1 series · 1 of 1 positions shown · non-contrast
Comparison: None.

CLINICAL DATA: Chest tube.

EXAM:
PORTABLE CHEST 1 VIEW

[AP]
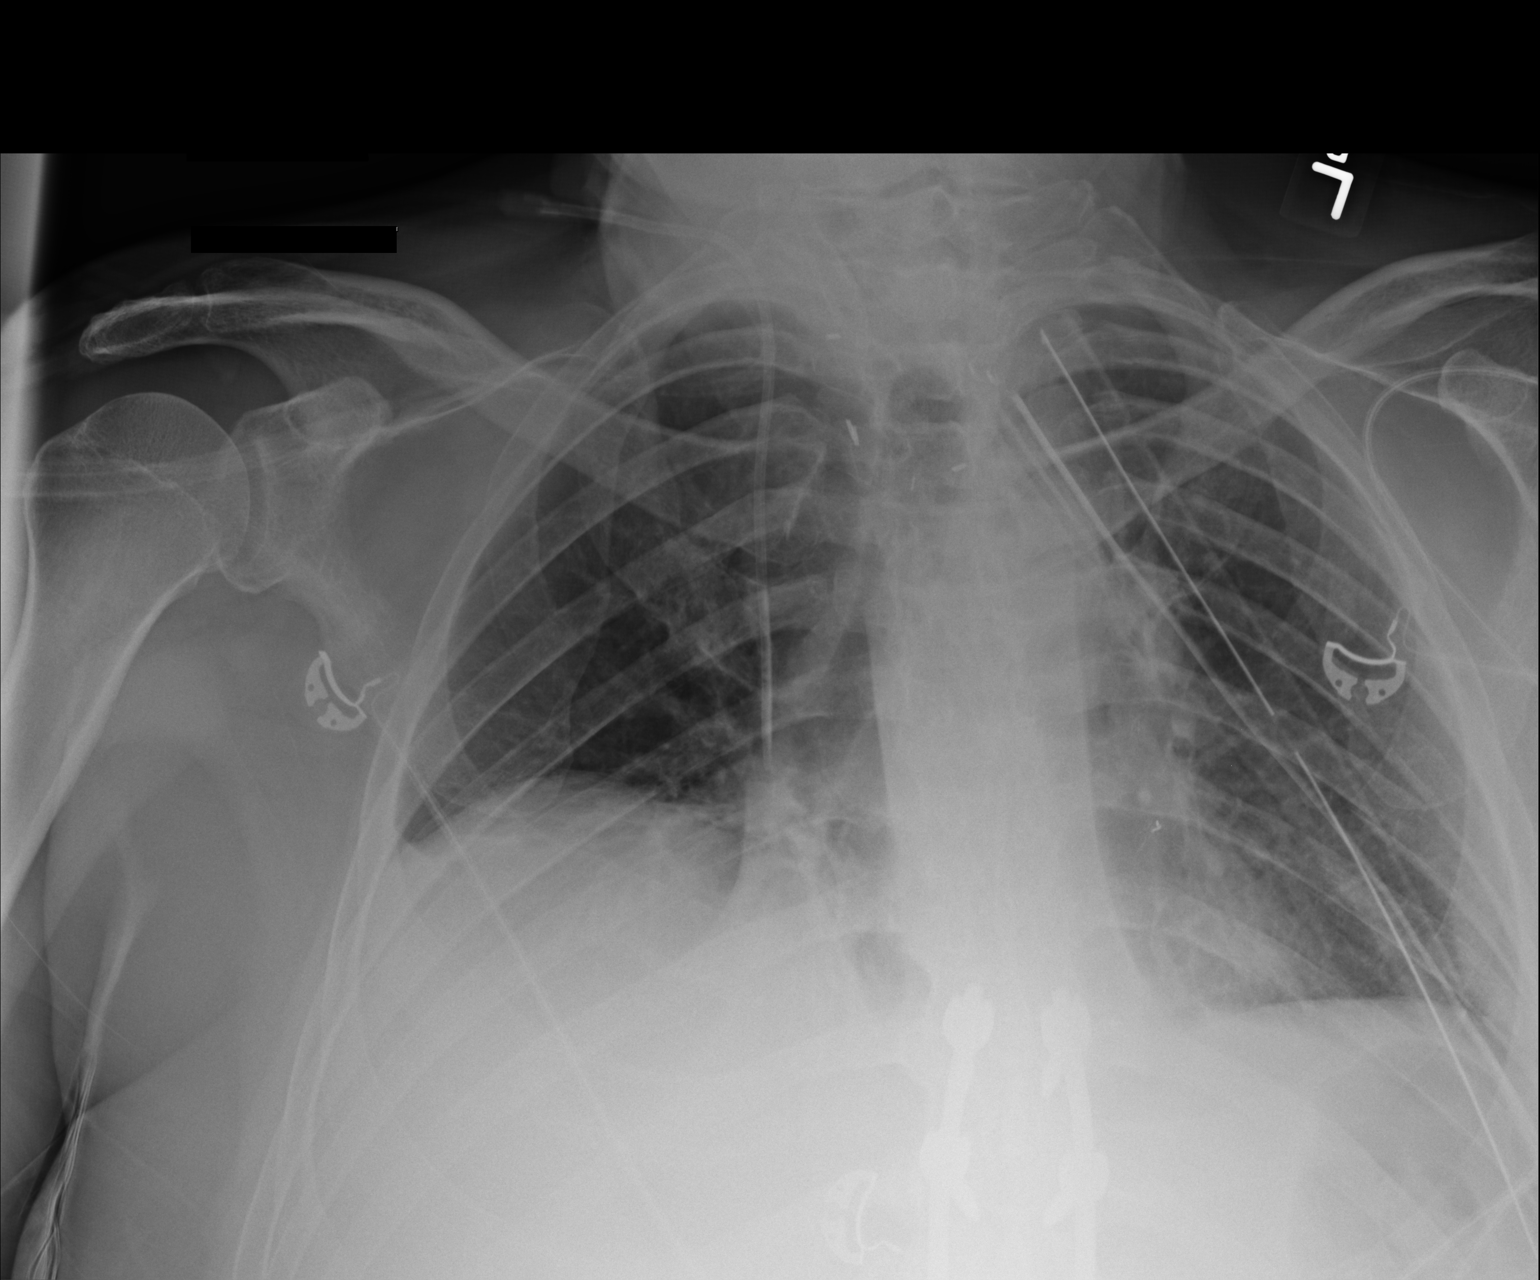

[1 of 1 positions shown; findings below may reference images not displayed]

FINDINGS: Two left chest tubes and right IJ line stable position. No
pneumothorax. Heart size normal. Bibasilar atelectasis. No pleural
effusion or pneumothorax.
IMPRESSION: 1. 2 left chest tubes and right IJ line in stable position. No
pneumothorax .
2. Stable bibasilar subsegmental atelectasis.

## 2017-05-20 IMAGING — CR DG CHEST 1V PORT
1 series · 1 of 1 positions shown · non-contrast
Comparison: March 17, 2015.

CLINICAL DATA: Shortness of breath.

EXAM:
PORTABLE CHEST 1 VIEW

[AP]
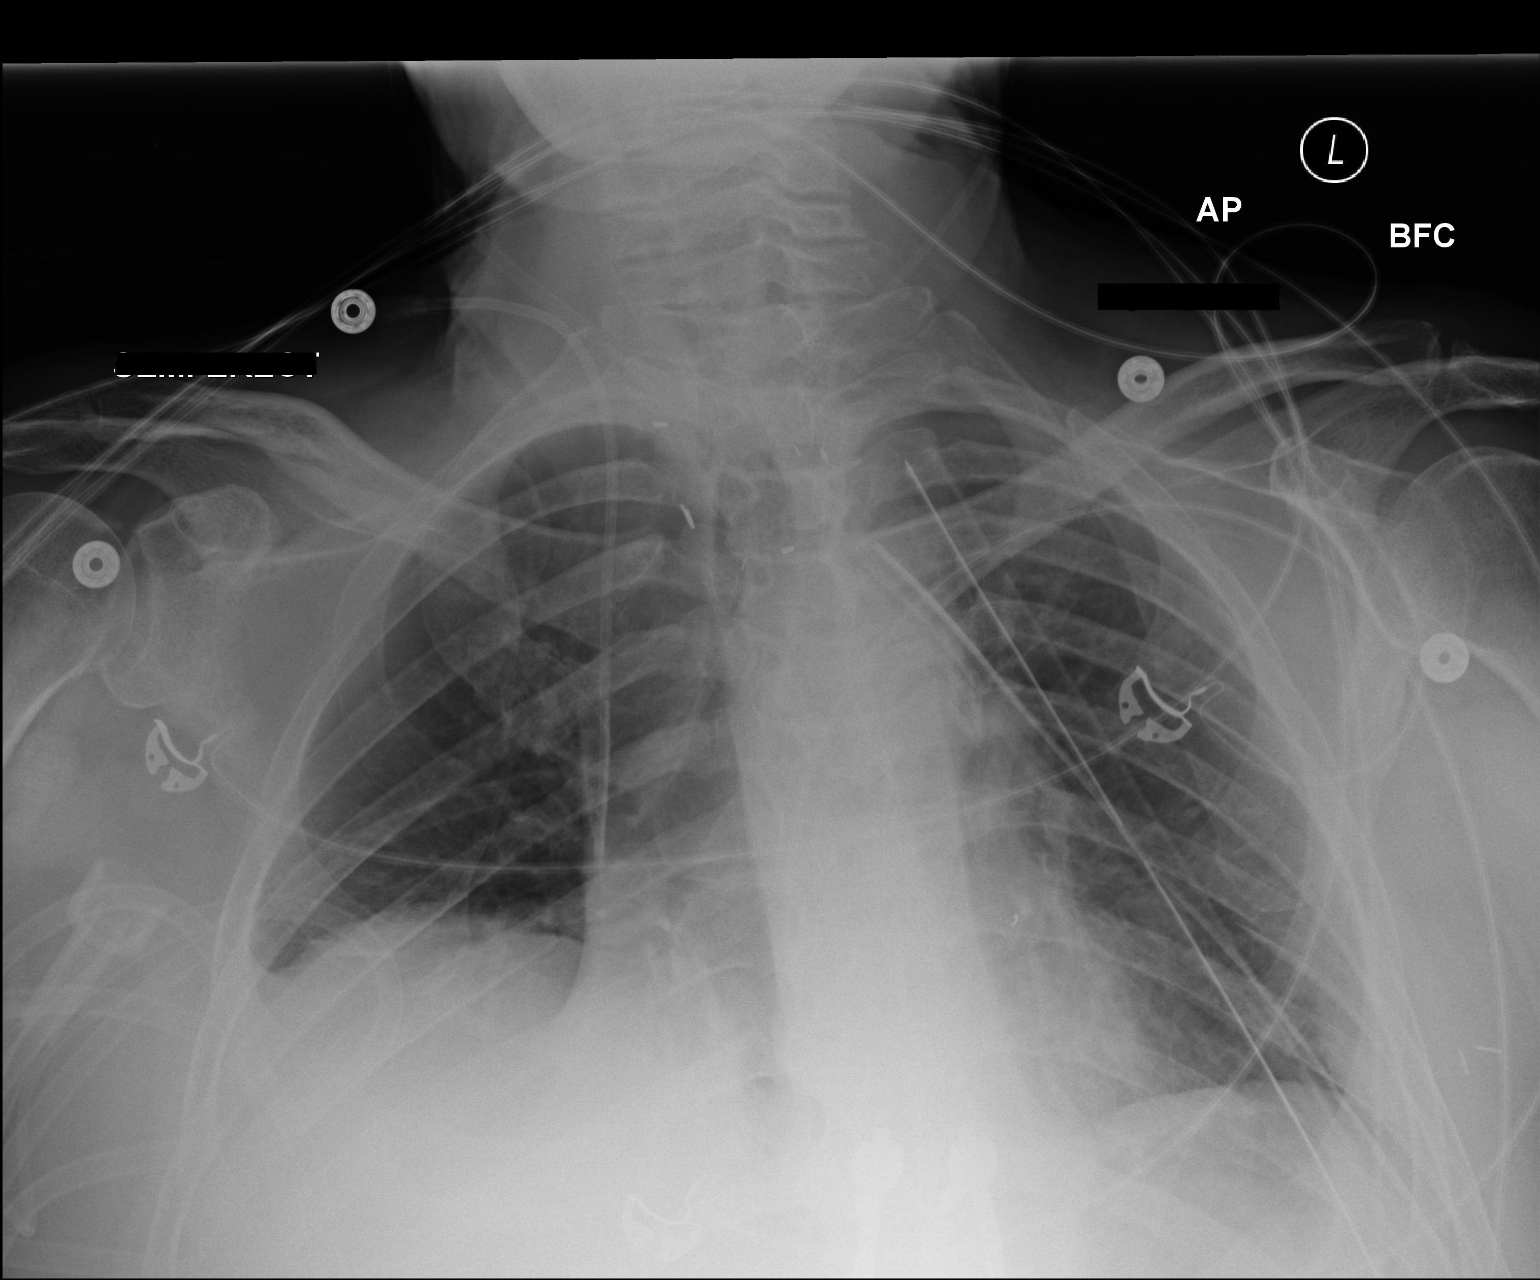

[1 of 1 positions shown; findings below may reference images not displayed]

FINDINGS: Two left-sided chest tubes are again noted and unchanged in
position. Minimal left apical pneumothorax is noted. Right internal
jugular catheter is unchanged in position. Elevated right
hemidiaphragm is again noted left lung is clear. Stable minimal
right basilar subsegmental atelectasis is noted. Bony thorax is
unremarkable.
IMPRESSION: Two left-sided chest tubes are again noted and unchanged in
position. Minimal left apical pneumothorax is noted. Minimal right
basilar subsegmental atelectasis is noted.

## 2017-05-21 IMAGING — CR DG CHEST 1V PORT
1 series · 1 of 1 positions shown · non-contrast
Comparison: 03/18/2015

CLINICAL DATA: Followup thoracotomy.  Shortness of breath.

EXAM:
PORTABLE CHEST 1 VIEW

[AP]
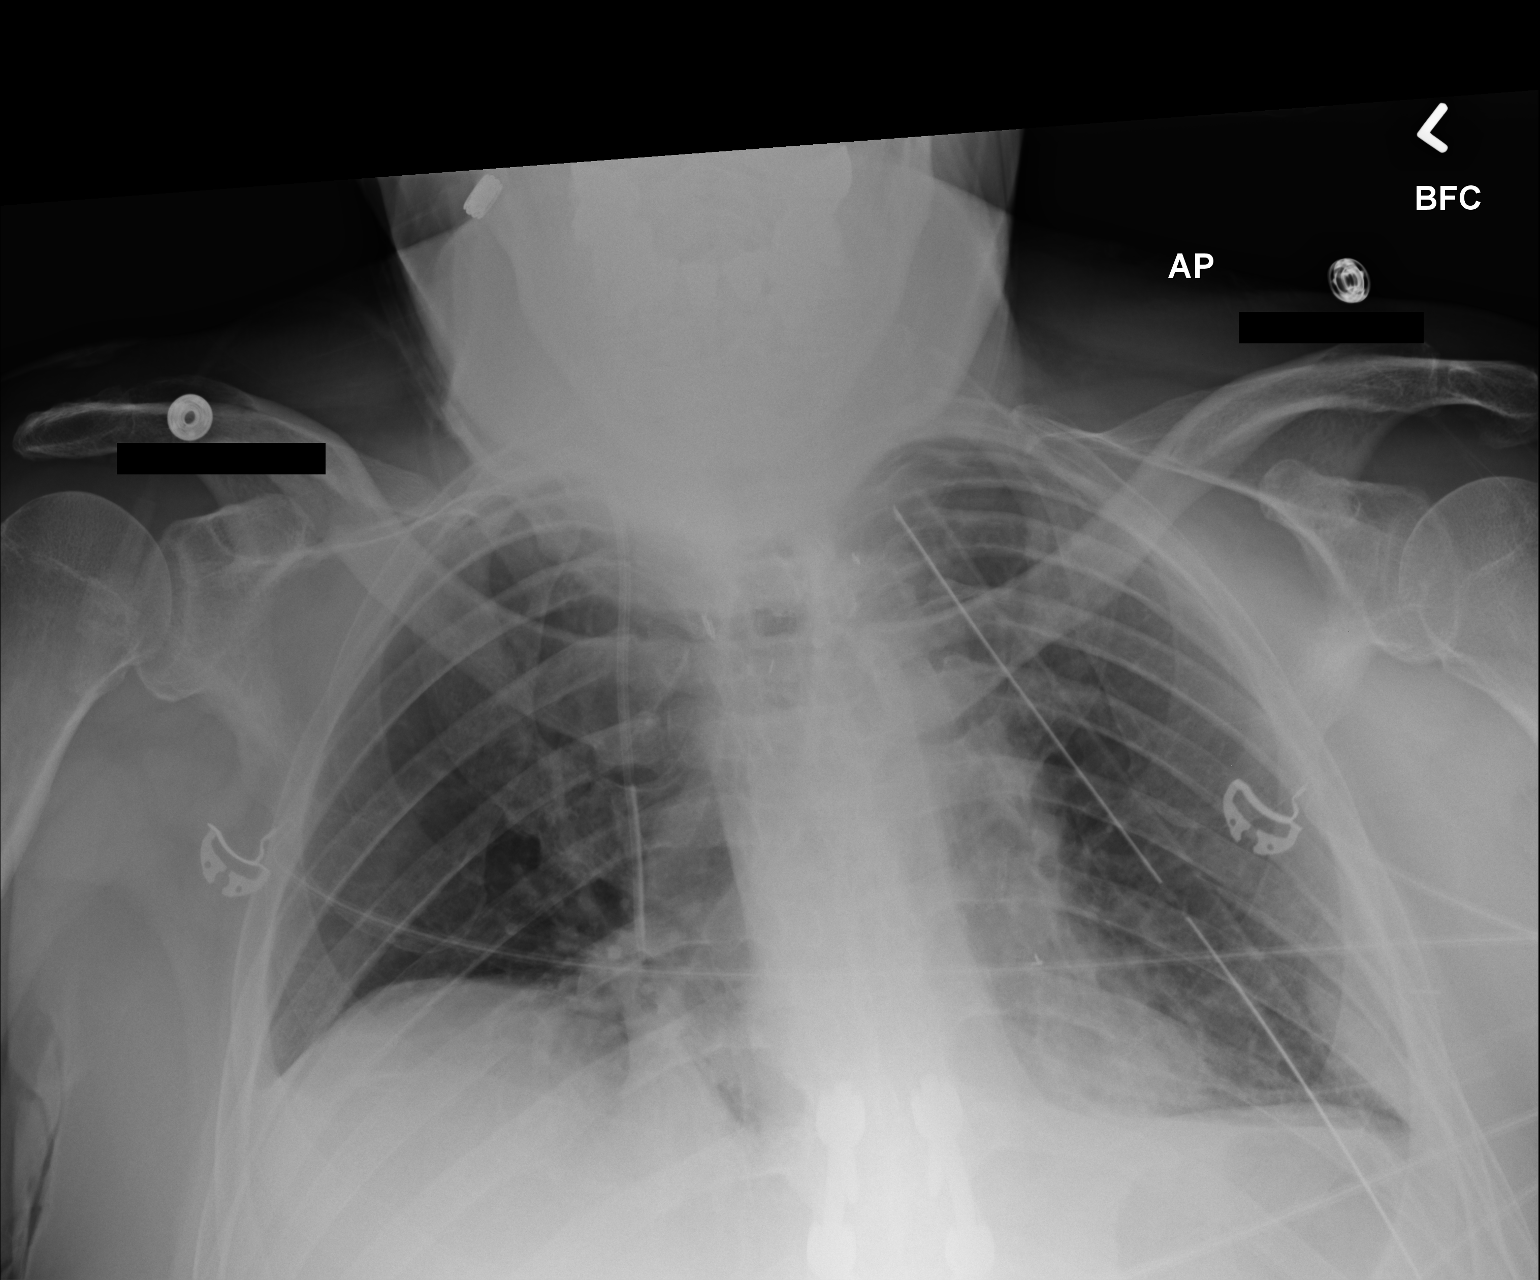

[1 of 1 positions shown; findings below may reference images not displayed]

FINDINGS: One of the 2 left chest tubes has been removed. No pneumothorax.
Right internal jugular central line is unchanged with the tip at the
SVC RA junction. Basilar atelectasis persists.
IMPRESSION: One of the 2 left chest tubes has been removed.  No pneumothorax.

## 2017-05-22 IMAGING — CR DG CHEST 2V
2 series · 2 of 2 positions shown · non-contrast
Comparison: March 19, 2015

CLINICAL DATA: Postoperative thoracotomy. Status post chest tube
removal

EXAM:
CHEST  2 VIEW

[chest pa]
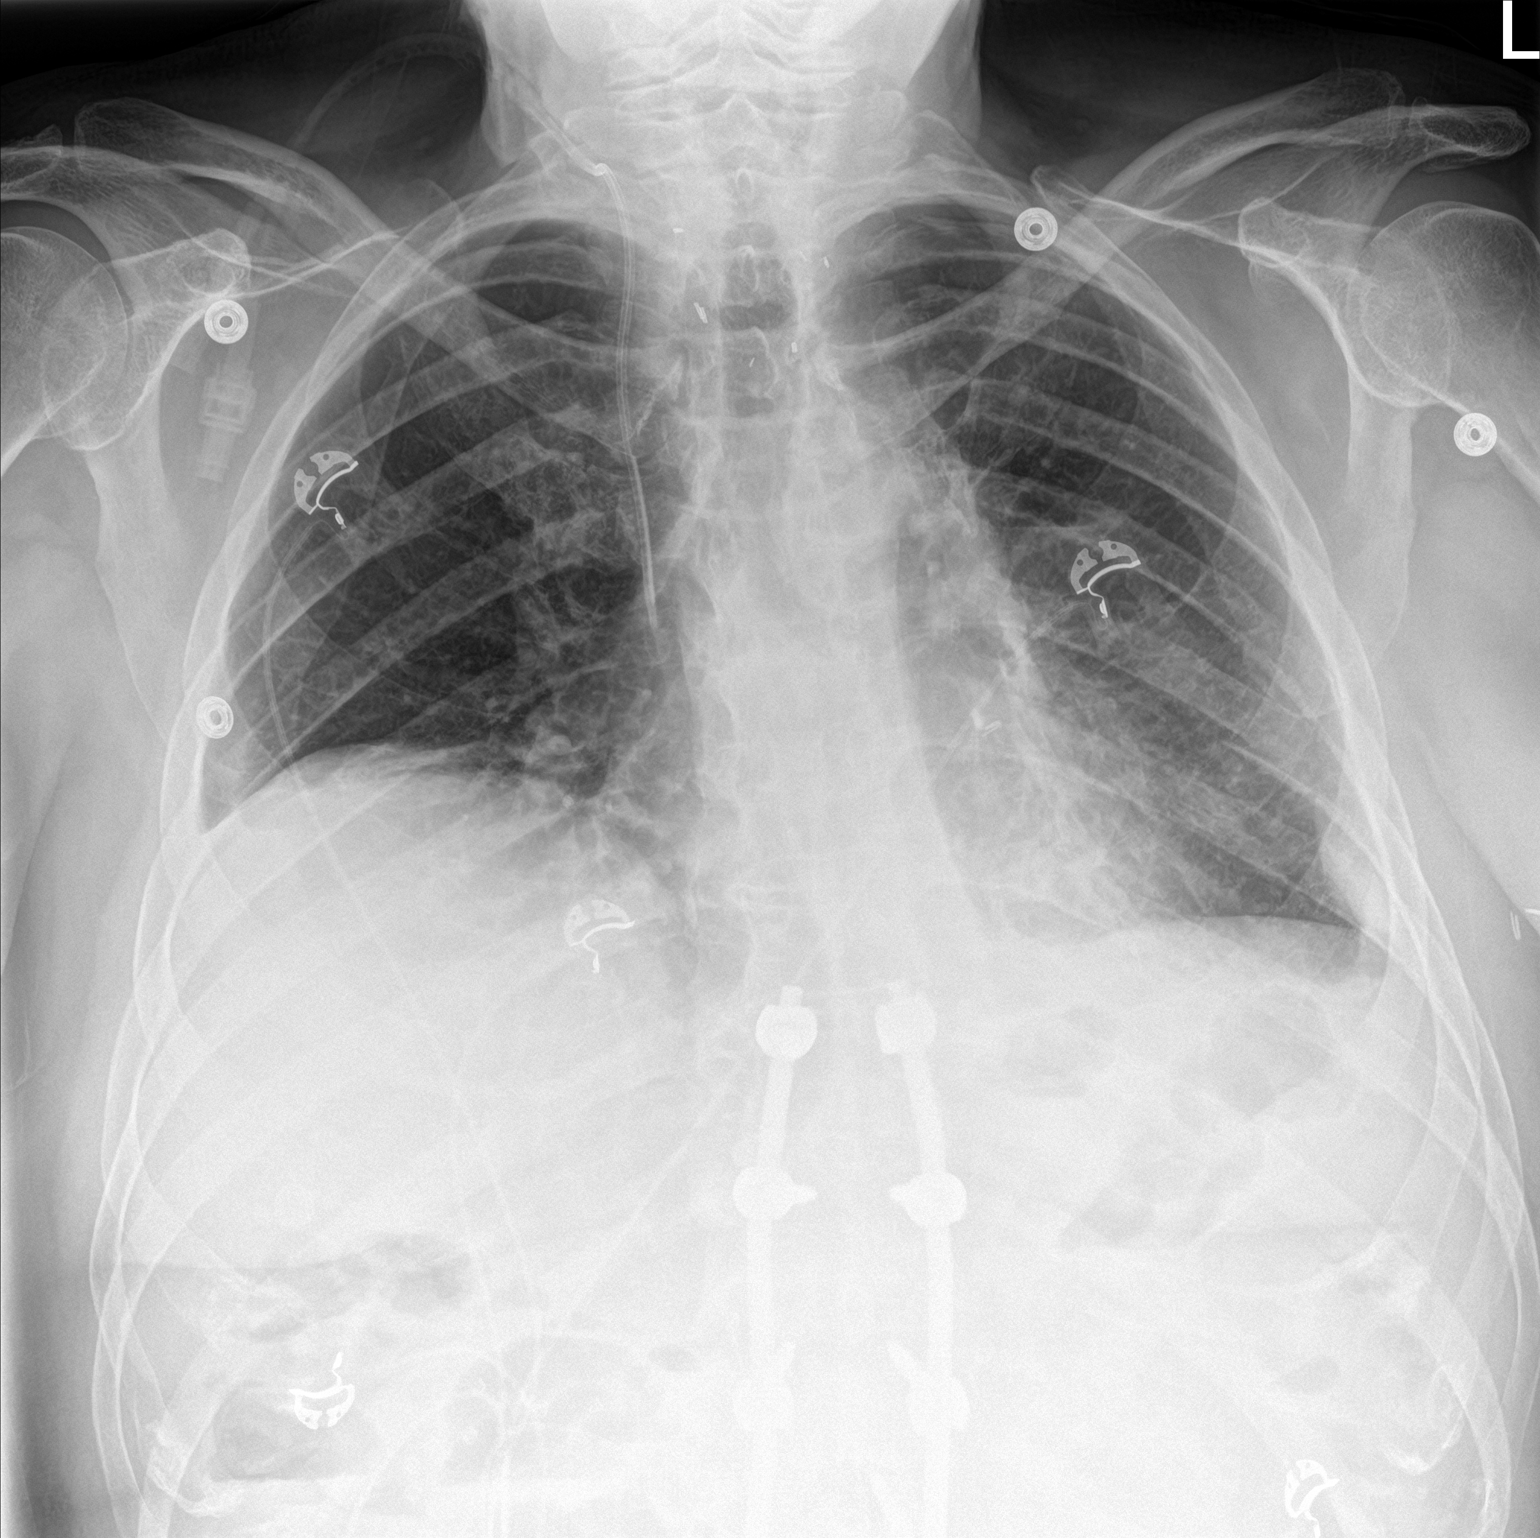

[chest lat]
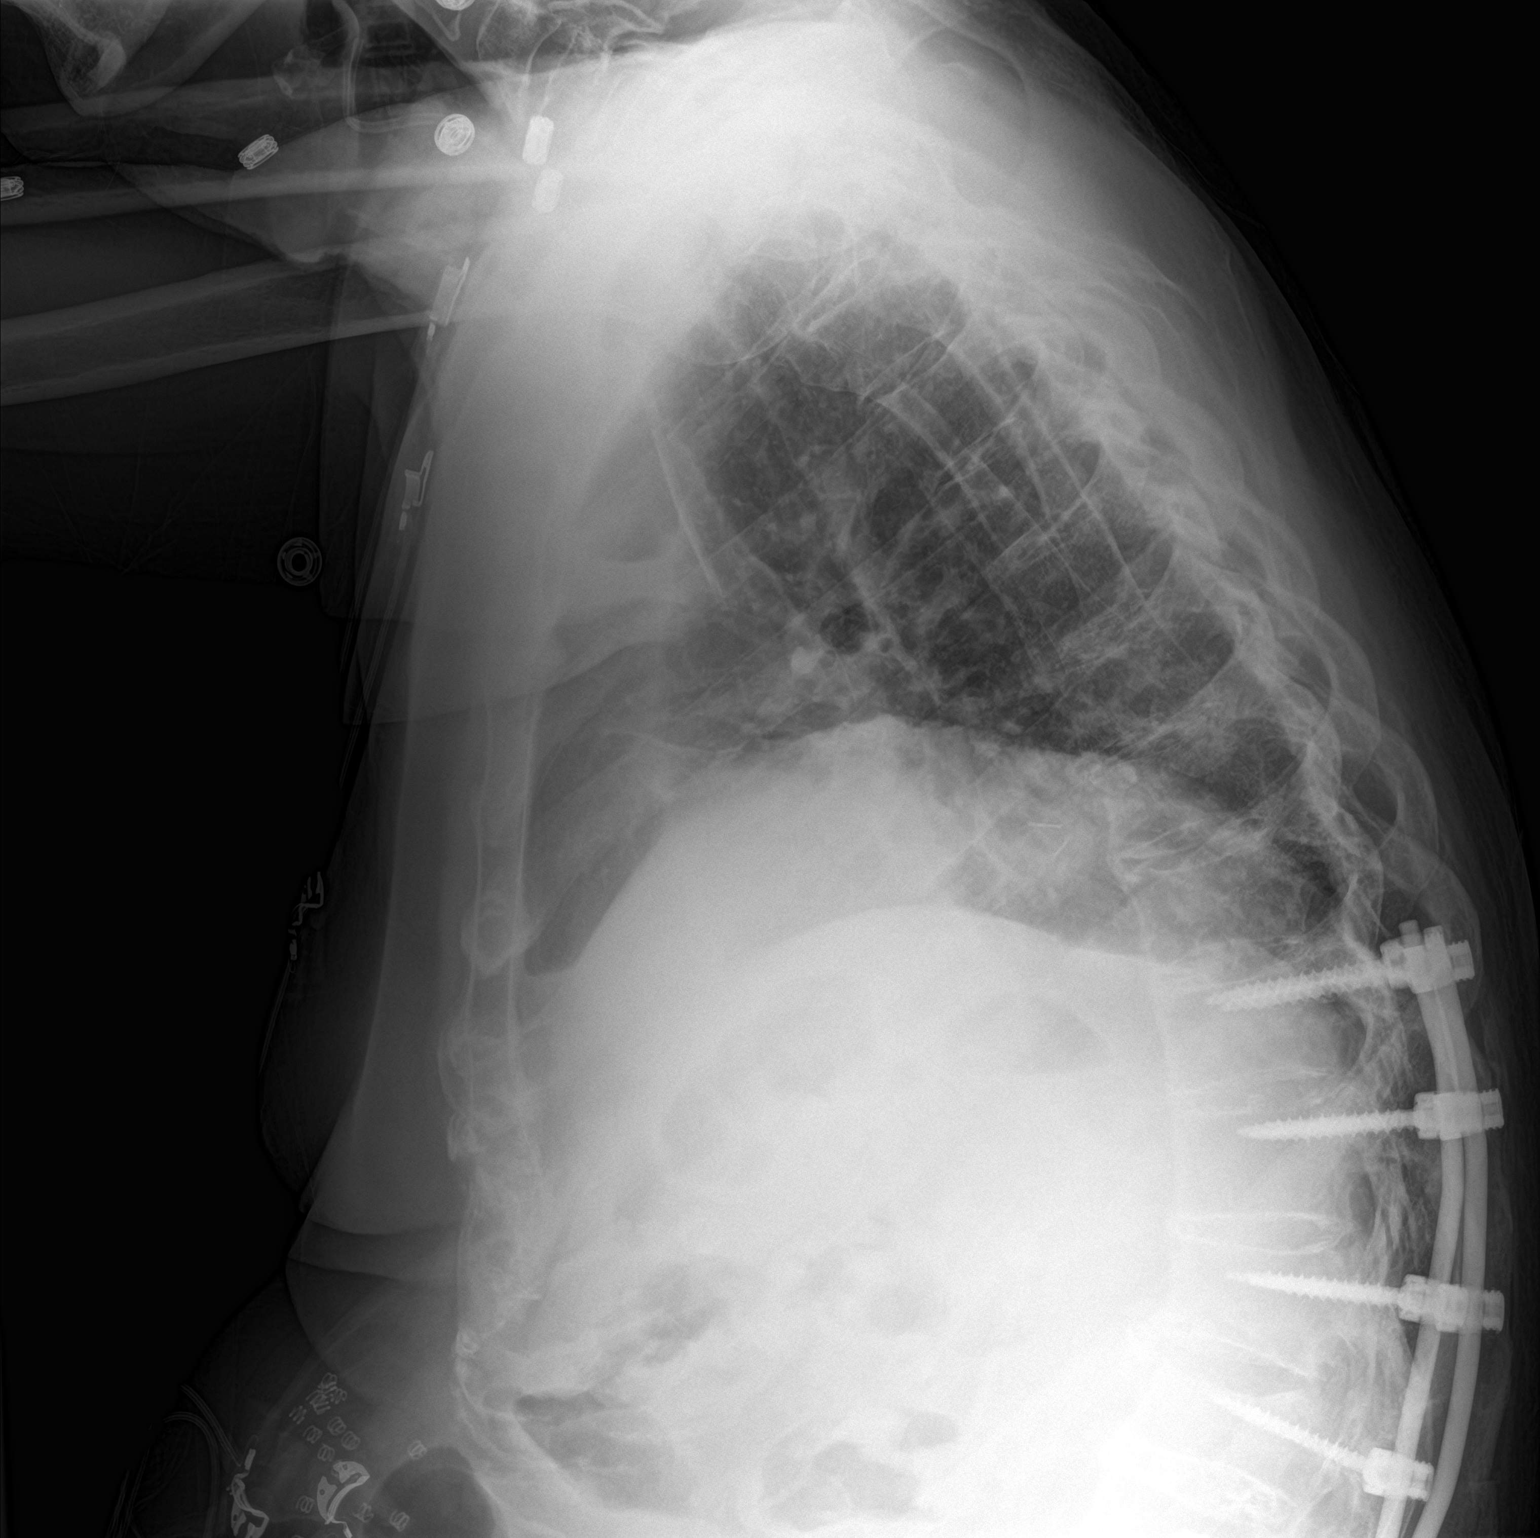

[2 of 2 positions shown; findings below may reference images not displayed]

FINDINGS: Remaining chest tube on the left has been removed. No apparent
pneumothorax. Central catheter tip is in the superior cava near the
cavoatrial junction. There is mild bibasilar atelectatic change,
slightly more on the right than on the left. There is a small
loculated effusion on the left. There is no frank edema or
consolidation. Heart size and pulmonary vascularity are within
normal limits. There is postoperative change in the upper midline
mediastinal region as well as in the lower thoracic and lumbar
regions. There is degenerative change in the thoracic spine.
IMPRESSION: No demonstrable pneumothorax. Small loculated effusion on the left.
Patchy bibasilar atelectasis, slightly more on the right than on the
left. No airspace consolidation appreciable.

## 2017-06-05 DIAGNOSIS — Z6831 Body mass index (BMI) 31.0-31.9, adult: Secondary | ICD-10-CM | POA: Diagnosis not present

## 2017-06-05 DIAGNOSIS — J069 Acute upper respiratory infection, unspecified: Secondary | ICD-10-CM | POA: Diagnosis not present

## 2017-06-05 DIAGNOSIS — R05 Cough: Secondary | ICD-10-CM | POA: Diagnosis not present

## 2017-06-05 IMAGING — CR DG CHEST 2V
2 series · 2 of 2 positions shown · non-contrast
Comparison: Chest x-ray of March 20, 2015

CLINICAL DATA: Status post thoracostomy and thoracotomy with left
upper lobectomy on March 16, 2015.

EXAM:
CHEST  2 VIEW

[w chest pa]
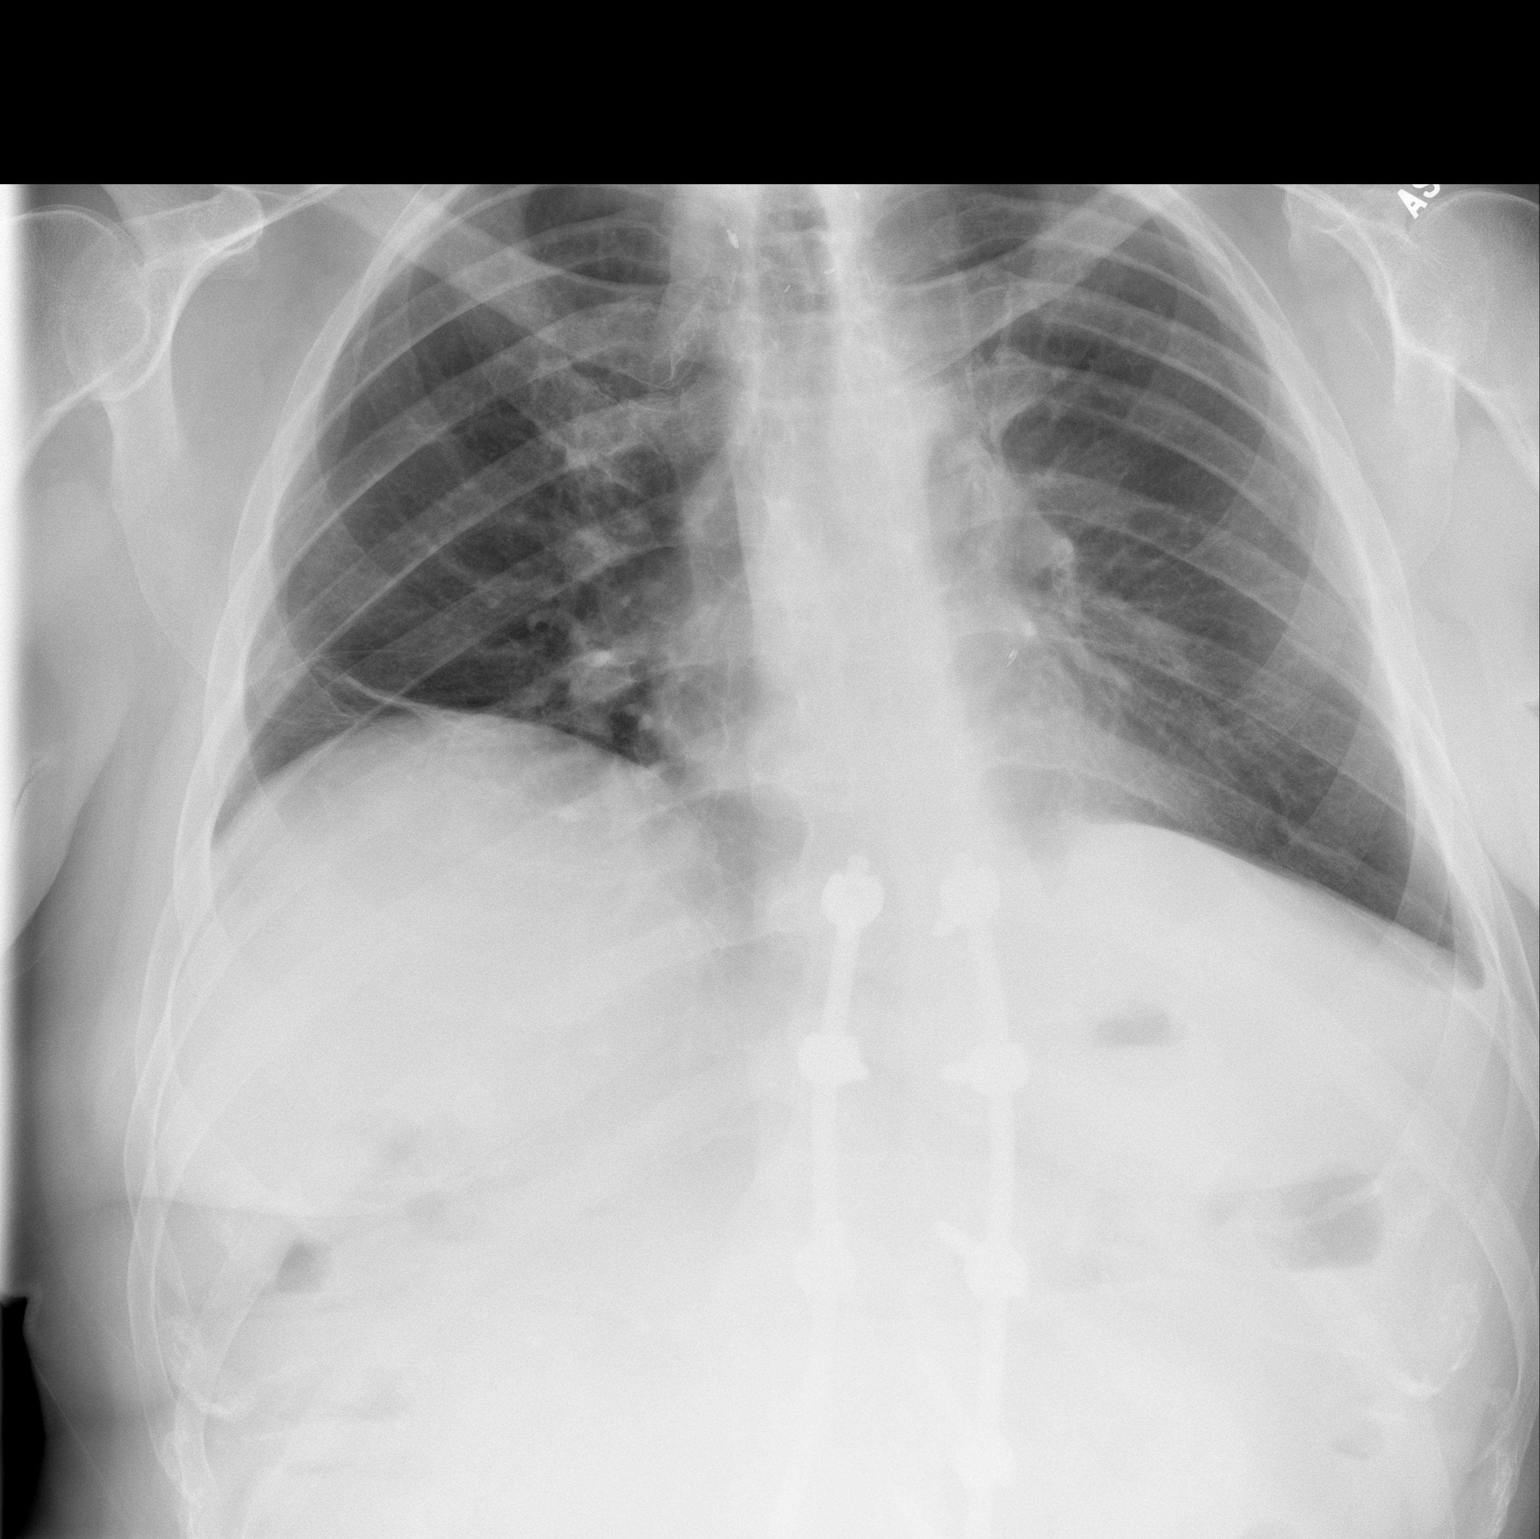

[w chest lat]
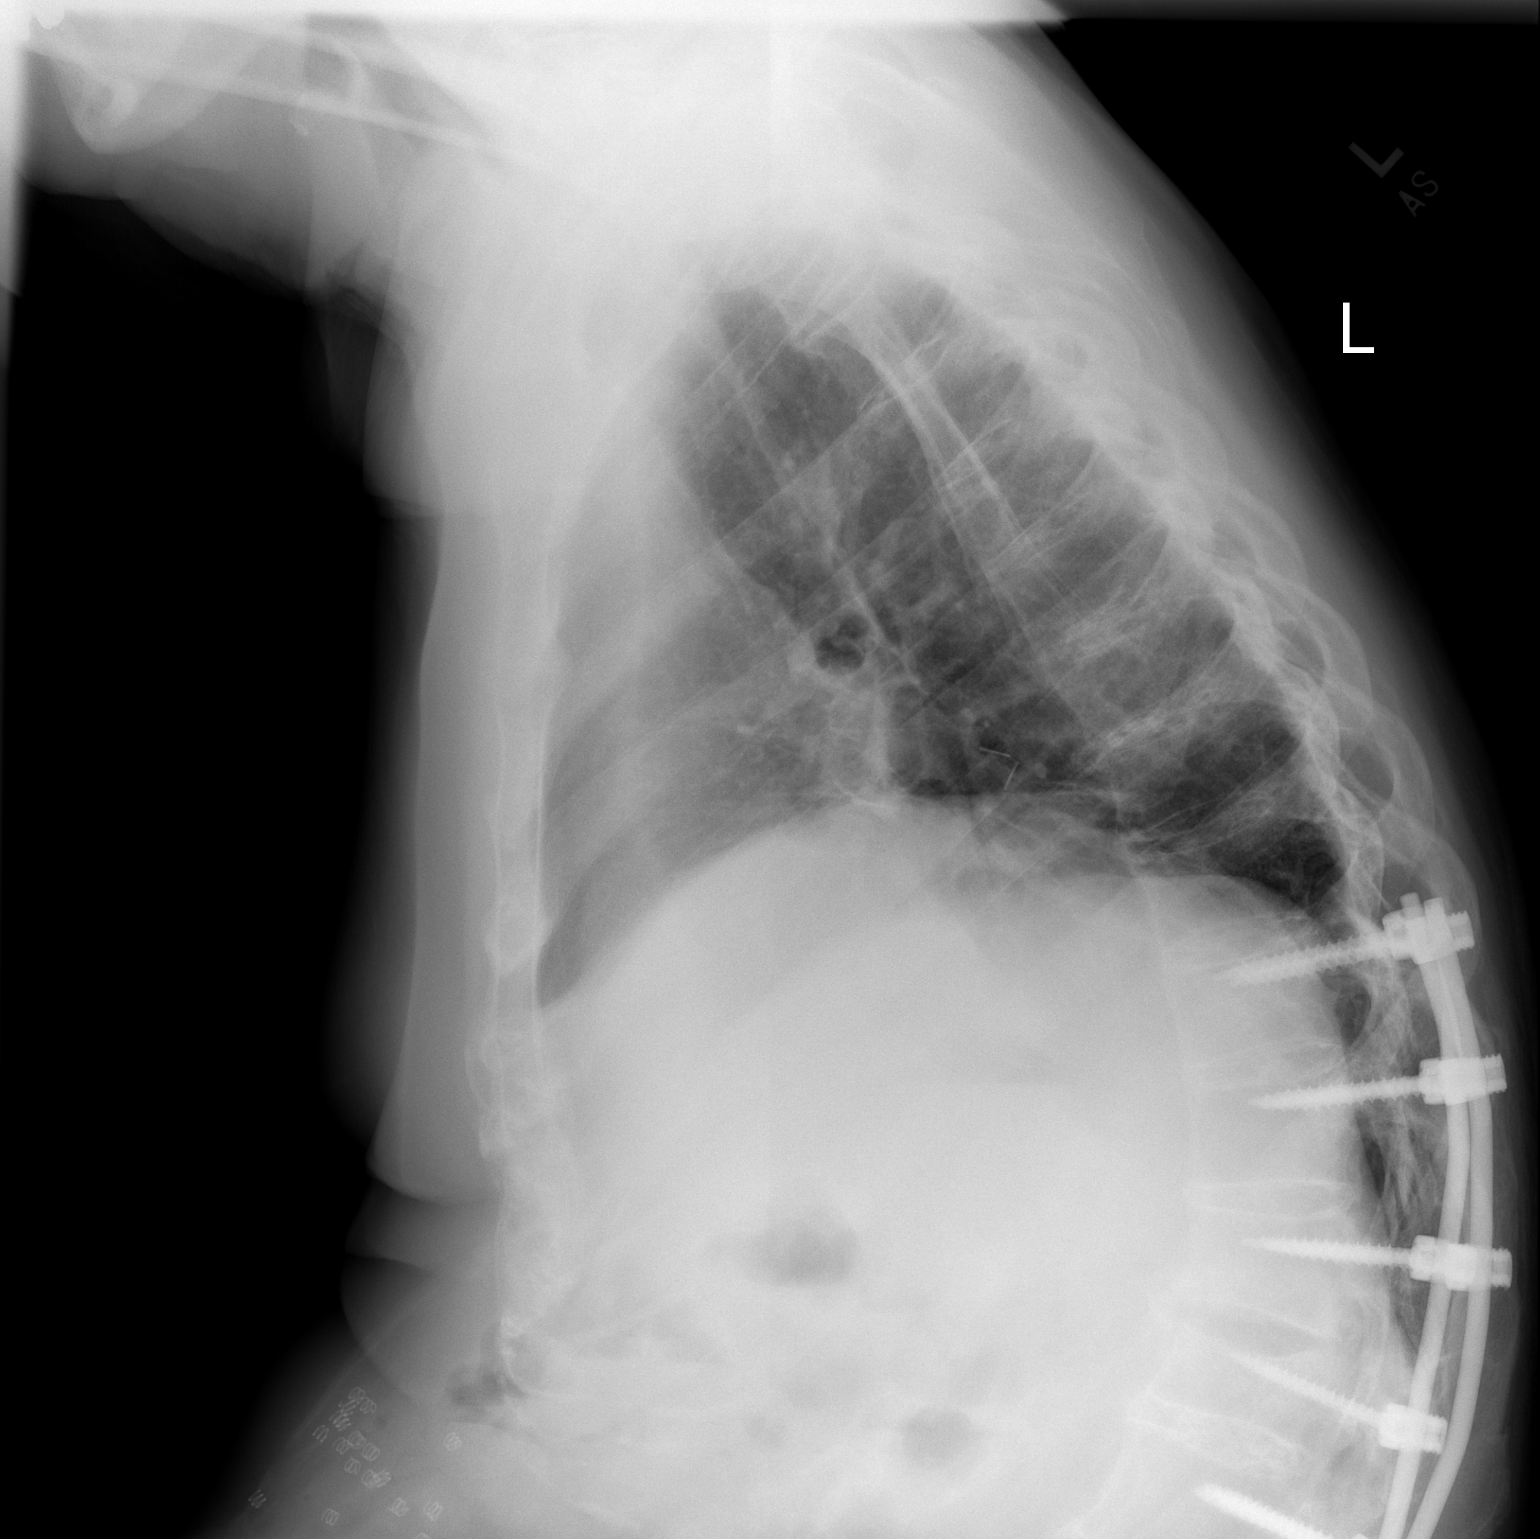

[2 of 2 positions shown; findings below may reference images not displayed]

FINDINGS: The lungs are hypoinflated. There is no pneumothorax. The small
left-sided pleural effusion has almost totally resolved. There is
persistent right basilar atelectasis or fibrosis. The left heart
border remains ill defined. There is no pulmonary vascular
congestion. There are surgical clips in the midline in the upper
thorax.
IMPRESSION: Near total resolution of the small left pleural effusion. Stable
right basilar atelectasis or scarring. There is no acute
cardiopulmonary abnormality.

## 2017-06-19 DIAGNOSIS — E119 Type 2 diabetes mellitus without complications: Secondary | ICD-10-CM | POA: Diagnosis not present

## 2017-06-19 DIAGNOSIS — H2513 Age-related nuclear cataract, bilateral: Secondary | ICD-10-CM | POA: Diagnosis not present

## 2017-06-19 DIAGNOSIS — E113292 Type 2 diabetes mellitus with mild nonproliferative diabetic retinopathy without macular edema, left eye: Secondary | ICD-10-CM | POA: Diagnosis not present

## 2017-06-19 DIAGNOSIS — H40013 Open angle with borderline findings, low risk, bilateral: Secondary | ICD-10-CM | POA: Diagnosis not present

## 2017-08-16 IMAGING — CR DG CHEST 2V
2 series · 2 of 2 positions shown · non-contrast
Comparison: 04/03/2015

CLINICAL DATA: Lung cancer. Post thoracotomy with lobectomy on left
side 03/16/2015.

EXAM:
CHEST  2 VIEW

[w chest pa]
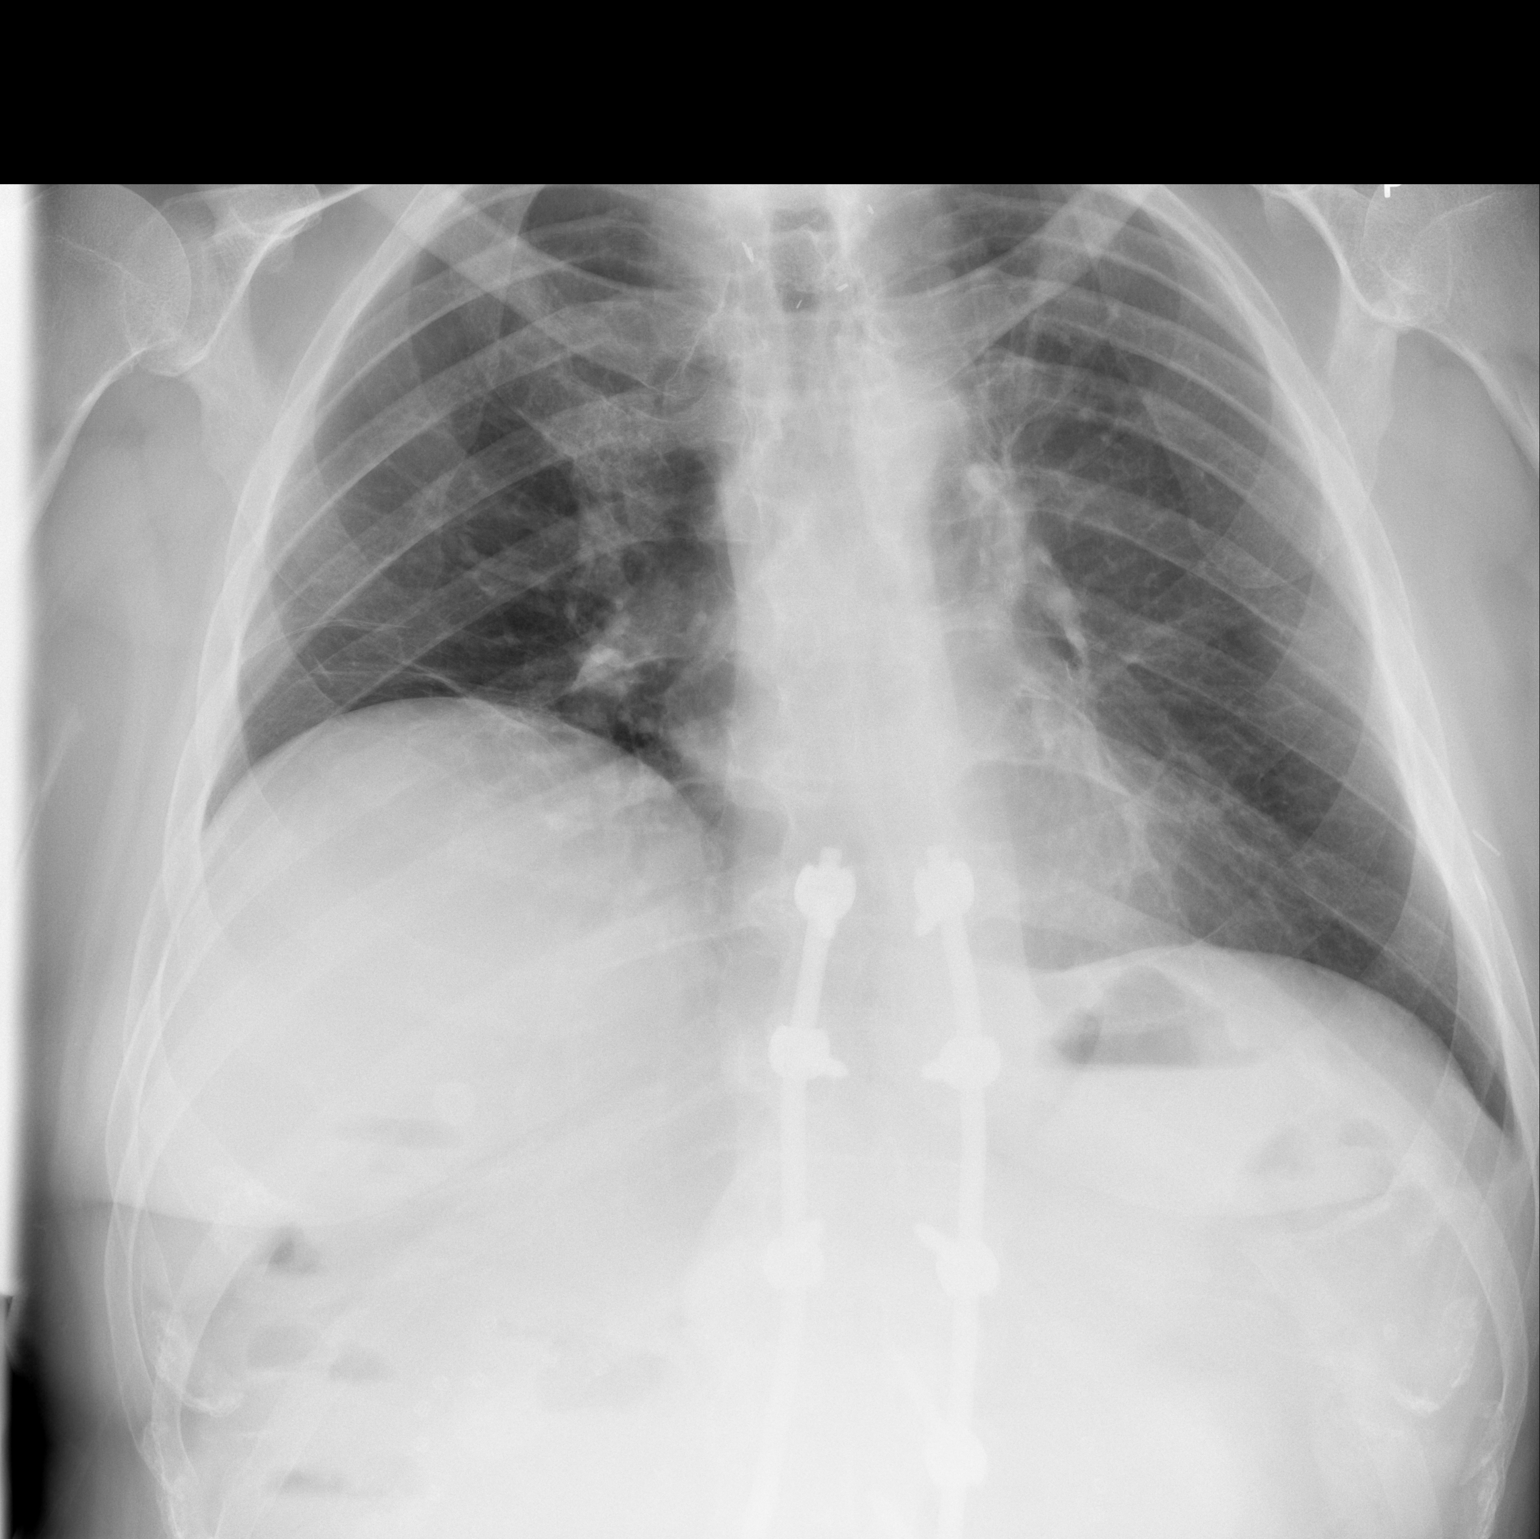

[w chest lat]
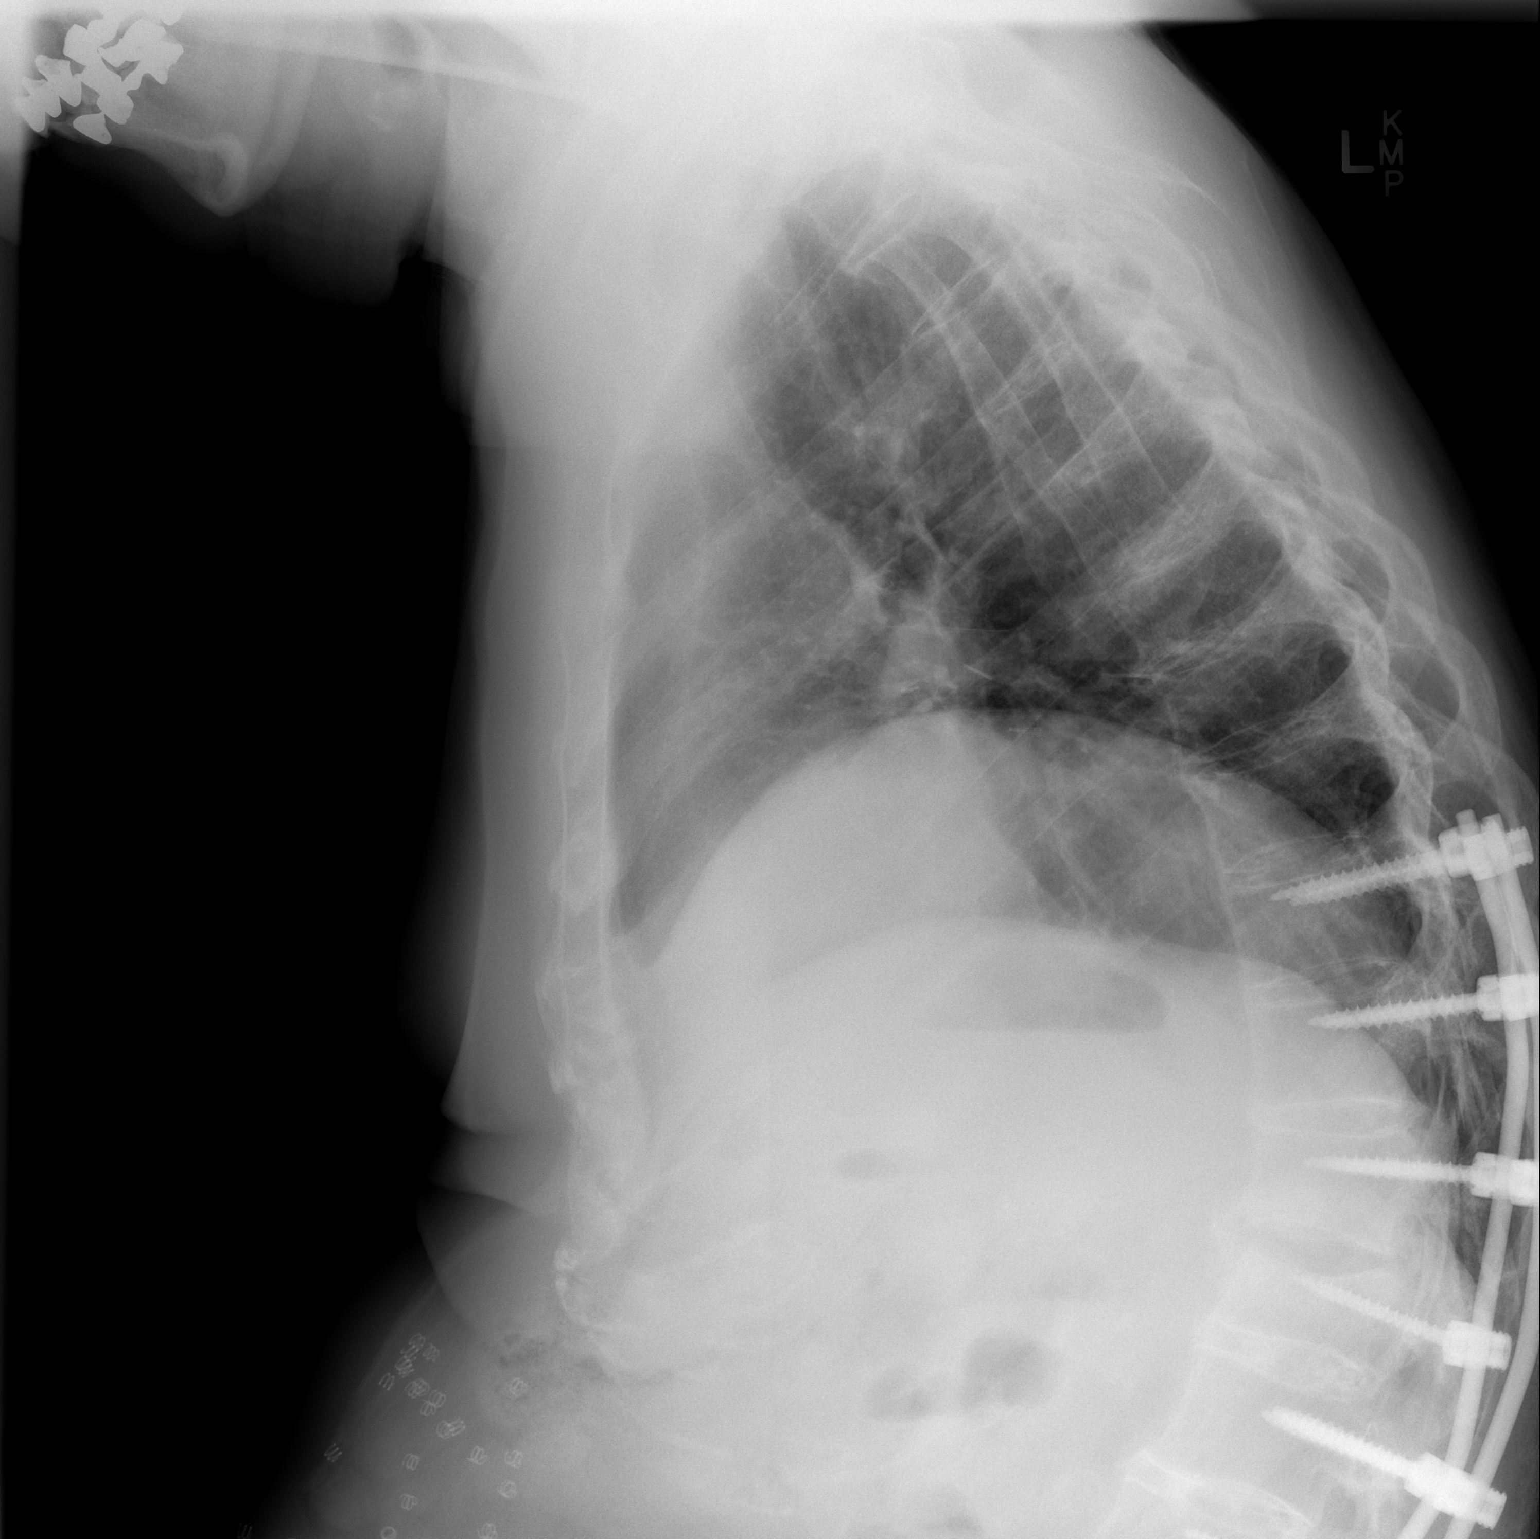

[2 of 2 positions shown; findings below may reference images not displayed]

FINDINGS: Elevation of the right hemidiaphragm with right basilar scarring.
Postoperative changes on the left. No confluent opacities or
effusions. Heart is normal size. No acute bony abnormality.
Postoperative changes in the lower thoracic and visualized upper
lumbar spine.
IMPRESSION: Elevated right hemidiaphragm with right basilar scarring. No active
disease.

## 2017-09-04 DIAGNOSIS — R82998 Other abnormal findings in urine: Secondary | ICD-10-CM | POA: Diagnosis not present

## 2017-09-04 DIAGNOSIS — E785 Hyperlipidemia, unspecified: Secondary | ICD-10-CM | POA: Diagnosis not present

## 2017-09-04 DIAGNOSIS — E039 Hypothyroidism, unspecified: Secondary | ICD-10-CM | POA: Diagnosis not present

## 2017-09-04 DIAGNOSIS — E1122 Type 2 diabetes mellitus with diabetic chronic kidney disease: Secondary | ICD-10-CM | POA: Diagnosis not present

## 2017-09-04 DIAGNOSIS — Z125 Encounter for screening for malignant neoplasm of prostate: Secondary | ICD-10-CM | POA: Diagnosis not present

## 2017-09-09 DIAGNOSIS — C44629 Squamous cell carcinoma of skin of left upper limb, including shoulder: Secondary | ICD-10-CM | POA: Diagnosis not present

## 2017-09-09 DIAGNOSIS — L820 Inflamed seborrheic keratosis: Secondary | ICD-10-CM | POA: Diagnosis not present

## 2017-09-09 DIAGNOSIS — C4442 Squamous cell carcinoma of skin of scalp and neck: Secondary | ICD-10-CM | POA: Diagnosis not present

## 2017-09-09 DIAGNOSIS — Z85828 Personal history of other malignant neoplasm of skin: Secondary | ICD-10-CM | POA: Diagnosis not present

## 2017-09-09 DIAGNOSIS — L570 Actinic keratosis: Secondary | ICD-10-CM | POA: Diagnosis not present

## 2017-09-09 DIAGNOSIS — D044 Carcinoma in situ of skin of scalp and neck: Secondary | ICD-10-CM | POA: Diagnosis not present

## 2017-09-11 DIAGNOSIS — J300 Vasomotor rhinitis: Secondary | ICD-10-CM | POA: Diagnosis not present

## 2017-09-11 DIAGNOSIS — E1122 Type 2 diabetes mellitus with diabetic chronic kidney disease: Secondary | ICD-10-CM | POA: Diagnosis not present

## 2017-09-11 DIAGNOSIS — Z0000 Encounter for general adult medical examination without abnormal findings: Secondary | ICD-10-CM | POA: Diagnosis not present

## 2017-09-11 DIAGNOSIS — N183 Chronic kidney disease, stage 3 (moderate): Secondary | ICD-10-CM | POA: Diagnosis not present

## 2017-09-11 DIAGNOSIS — Z85118 Personal history of other malignant neoplasm of bronchus and lung: Secondary | ICD-10-CM | POA: Diagnosis not present

## 2017-09-11 DIAGNOSIS — E038 Other specified hypothyroidism: Secondary | ICD-10-CM | POA: Diagnosis not present

## 2017-09-11 DIAGNOSIS — K5090 Crohn's disease, unspecified, without complications: Secondary | ICD-10-CM | POA: Diagnosis not present

## 2017-09-11 DIAGNOSIS — Z6832 Body mass index (BMI) 32.0-32.9, adult: Secondary | ICD-10-CM | POA: Diagnosis not present

## 2017-09-11 DIAGNOSIS — C61 Malignant neoplasm of prostate: Secondary | ICD-10-CM | POA: Diagnosis not present

## 2017-09-11 DIAGNOSIS — E7849 Other hyperlipidemia: Secondary | ICD-10-CM | POA: Diagnosis not present

## 2017-09-11 DIAGNOSIS — Z1389 Encounter for screening for other disorder: Secondary | ICD-10-CM | POA: Diagnosis not present

## 2017-09-11 DIAGNOSIS — I10 Essential (primary) hypertension: Secondary | ICD-10-CM | POA: Diagnosis not present

## 2017-11-11 DIAGNOSIS — C61 Malignant neoplasm of prostate: Secondary | ICD-10-CM | POA: Diagnosis not present

## 2017-11-19 DIAGNOSIS — C7951 Secondary malignant neoplasm of bone: Secondary | ICD-10-CM | POA: Diagnosis not present

## 2017-11-19 DIAGNOSIS — C61 Malignant neoplasm of prostate: Secondary | ICD-10-CM | POA: Diagnosis not present

## 2017-11-24 DIAGNOSIS — Z85118 Personal history of other malignant neoplasm of bronchus and lung: Secondary | ICD-10-CM | POA: Diagnosis not present

## 2017-11-24 DIAGNOSIS — J019 Acute sinusitis, unspecified: Secondary | ICD-10-CM | POA: Diagnosis not present

## 2017-11-24 DIAGNOSIS — R05 Cough: Secondary | ICD-10-CM | POA: Diagnosis not present

## 2017-11-24 DIAGNOSIS — Z6830 Body mass index (BMI) 30.0-30.9, adult: Secondary | ICD-10-CM | POA: Diagnosis not present

## 2017-12-12 IMAGING — CT CT CHEST W/ CM
2 of 4 series · 13 of 36 positions shown, 16 images · IV contrast (iopamidol)
Comparison: 03/08/2015 PET-CT.

CLINICAL DATA: Well differentiated stage I invasive lung
adenocarcinoma of the left upper lobe status post left upper
lobectomy 03/16/2015, presenting for restaging.

EXAM:
CT CHEST WITH CONTRAST
TECHNIQUE: Multidetector CT imaging of the chest was performed during
intravenous contrast administration.
CONTRAST:  60mL [id number] IOPAMIDOL ([id number]-[id number]) INJECTION 61%

[Series 2: chest with st · axial · 0.98mm/px · z∈[-272,-17]mm · 10 of 105 slices shown, 13 images]
[im 10/105  mediastinal]
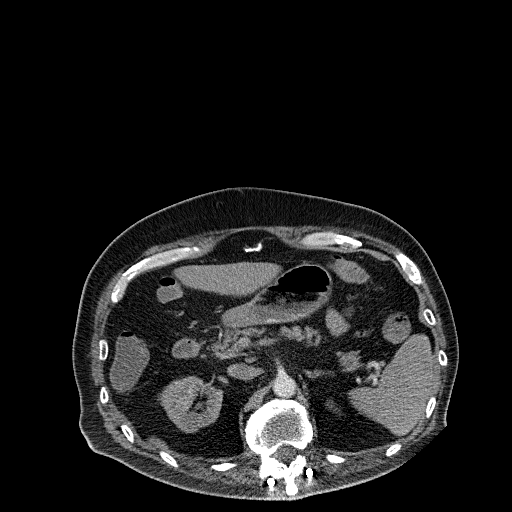
[im 10/105  lung]
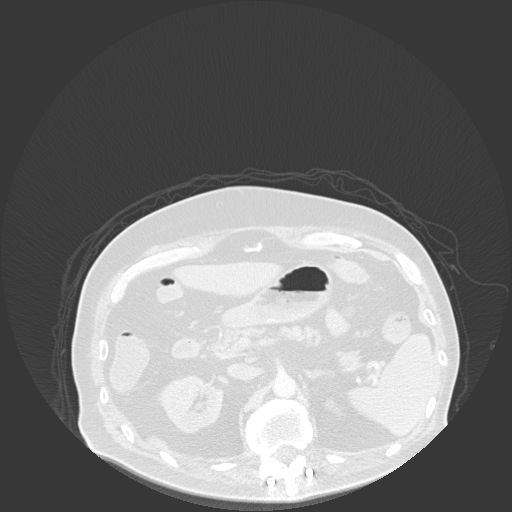
[im 19/105  lung]
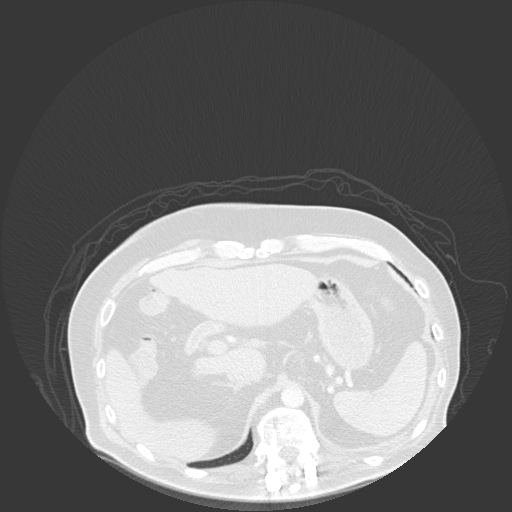
[im 29/105  lung]
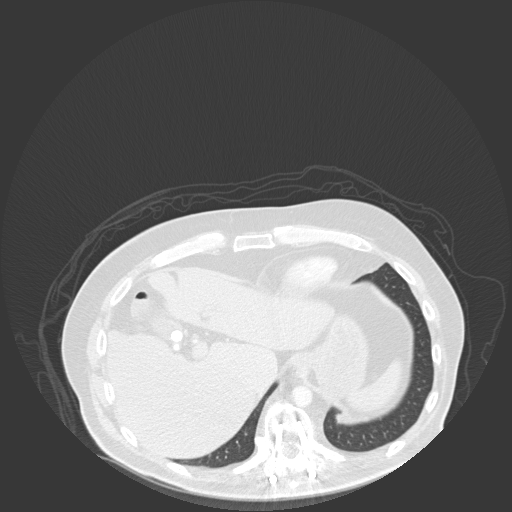
[im 38/105  lung]
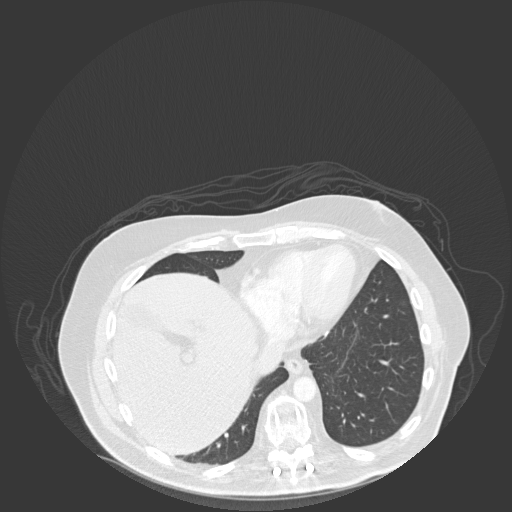
[im 48/105  mediastinal]
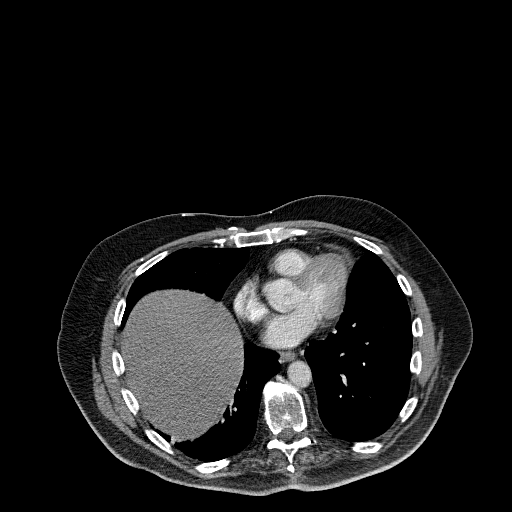
[im 48/105  lung]
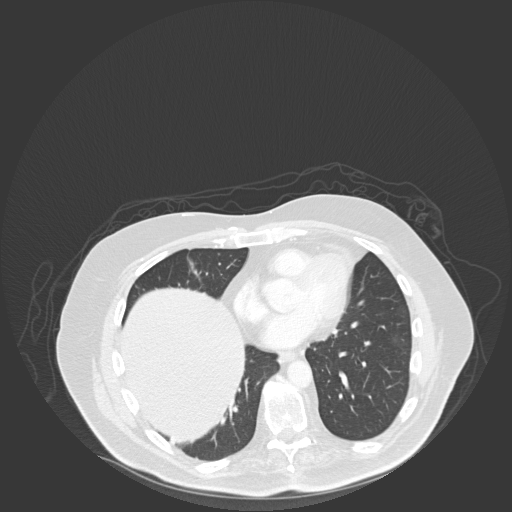
[im 57/105  lung]
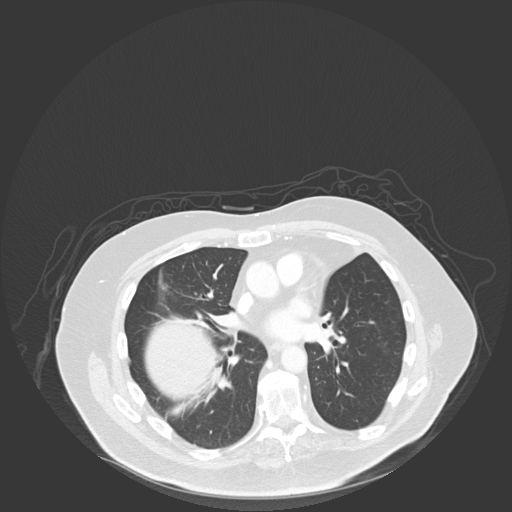
[im 67/105  lung]
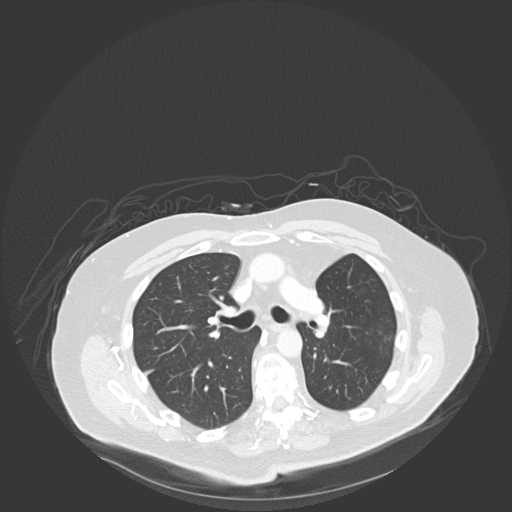
[im 76/105  lung]
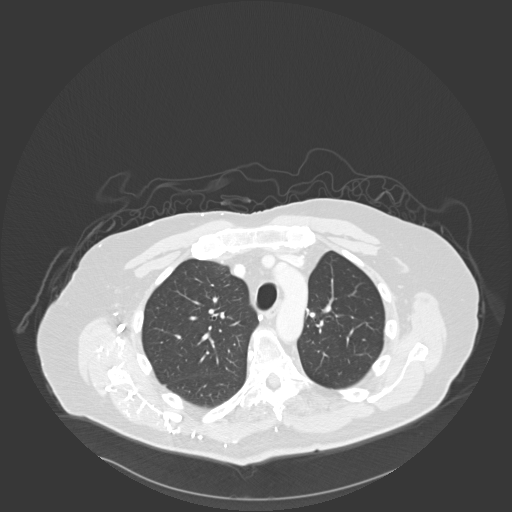
[im 86/105  mediastinal]
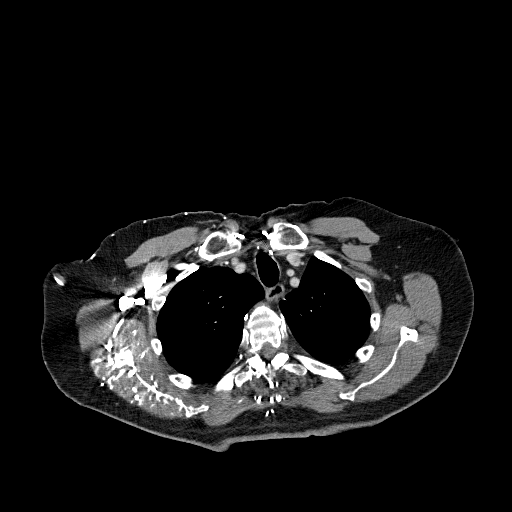
[im 86/105  lung]
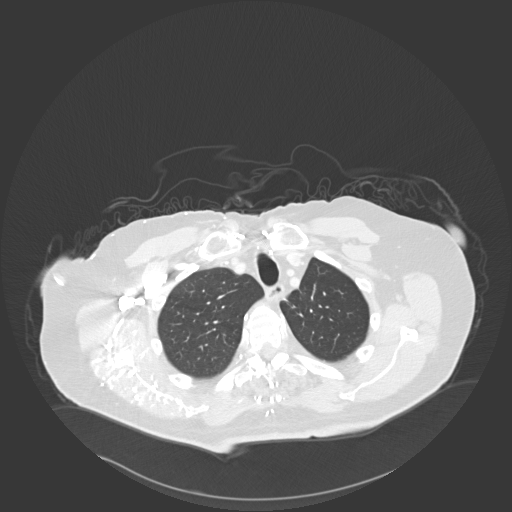
[im 95/105  lung]
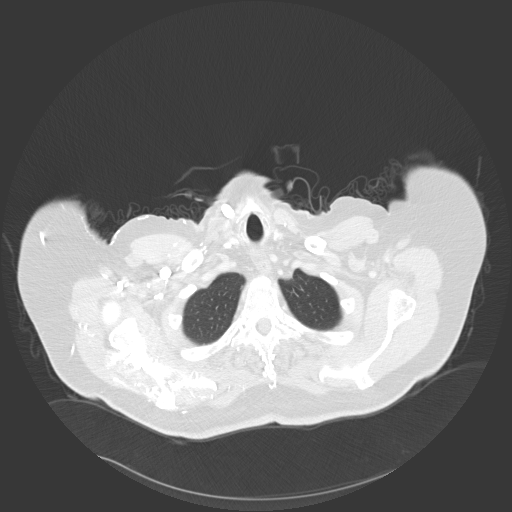

[Series 602: <mpr thick range> · coronal · 0.98mm/px · 3 of 146 slices shown]
[im 30/146  lung]
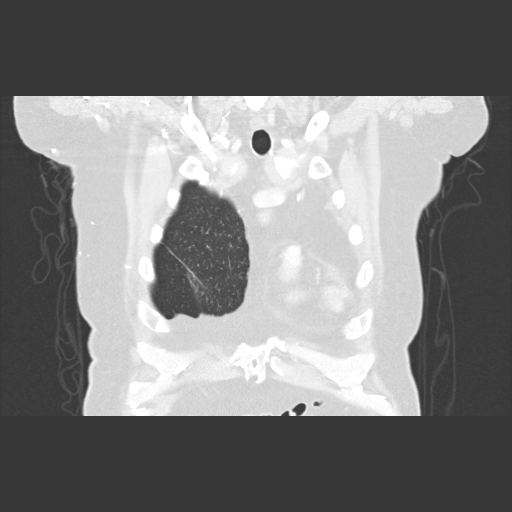
[im 59/146  lung]
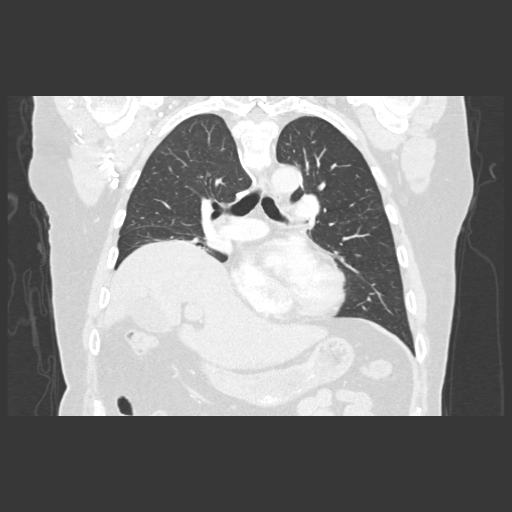
[im 88/146  lung]
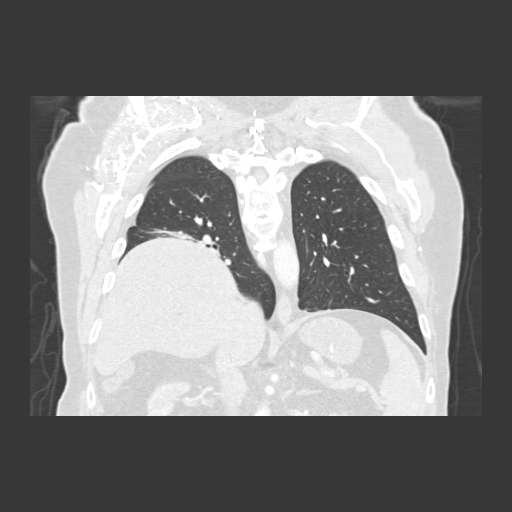

[13 of 36 positions shown; findings below may reference images not displayed]

FINDINGS: Mediastinum/Nodes: Normal heart size. Trace pericardial
fluid/thickening in the left anterior pericardial space, new. Right
coronary atherosclerosis. Great vessels are normal in course and
caliber. No central pulmonary emboli. Total thyroidectomy. Normal
esophagus. No pathologically enlarged axillary, mediastinal or hilar
lymph nodes.

Lungs/Pleura: No pneumothorax. No pleural effusion. Status post left
upper lobectomy. Subpleural solid 4 mm posterior left lower lobe
pulmonary nodule (series 5/ image 39), not definitely seen on prior
PET-CT. Faint peribronchovascular ground-glass attenuation in the
mid portion of the left lower lobe (series 5/image 50). No acute
consolidative airspace disease or additional significant pulmonary
nodules. Stable elevation of the right hemidiaphragm with
subsegmental atelectasis at the right lung base.

Upper abdomen: Nondistended gallbladder contains gallstones
measuring up to the 13 mm, with no gallbladder wall thickening or
pericholecystic fluid. Partially visualized hernia repair mesh in
the ventral upper abdomen.

Musculoskeletal: No aggressive appearing focal osseous lesions.
Moderate degenerative changes in the thoracic spine. Partially
visualized bilateral posterior spinal fusion hardware extending from
the T10 level inferiorly into the lumbar spine. Mild L1 compression
fracture appears stable since 06/14/2015 radiographs and chronic.
IMPRESSION: 1. No definite evidence of local tumor recurrence or metastatic
disease status post left upper lobectomy.
2. Nonspecific faint peribronchovascular ground-glass attenuation in
the left lower lobe, favor a minimal infectious or inflammatory
bronchiolitis, recommend attention on follow-up chest CT in 3
months.
3. Solitary subpleural 4 mm solid left lower lobe pulmonary nodule,
not definitely seen on prior PET-CT, recommend attention on
follow-up chest CT in 3 months.
4. One vessel coronary atherosclerosis.
5. Cholelithiasis.

## 2017-12-24 DIAGNOSIS — L570 Actinic keratosis: Secondary | ICD-10-CM | POA: Diagnosis not present

## 2017-12-24 DIAGNOSIS — L821 Other seborrheic keratosis: Secondary | ICD-10-CM | POA: Diagnosis not present

## 2017-12-24 DIAGNOSIS — D1801 Hemangioma of skin and subcutaneous tissue: Secondary | ICD-10-CM | POA: Diagnosis not present

## 2017-12-24 DIAGNOSIS — C4442 Squamous cell carcinoma of skin of scalp and neck: Secondary | ICD-10-CM | POA: Diagnosis not present

## 2017-12-24 DIAGNOSIS — Z85828 Personal history of other malignant neoplasm of skin: Secondary | ICD-10-CM | POA: Diagnosis not present

## 2017-12-24 DIAGNOSIS — D225 Melanocytic nevi of trunk: Secondary | ICD-10-CM | POA: Diagnosis not present

## 2017-12-24 DIAGNOSIS — L814 Other melanin hyperpigmentation: Secondary | ICD-10-CM | POA: Diagnosis not present

## 2018-01-12 DIAGNOSIS — Z85850 Personal history of malignant neoplasm of thyroid: Secondary | ICD-10-CM | POA: Diagnosis not present

## 2018-01-13 DIAGNOSIS — K5080 Crohn's disease of both small and large intestine without complications: Secondary | ICD-10-CM | POA: Diagnosis not present

## 2018-01-16 DIAGNOSIS — I10 Essential (primary) hypertension: Secondary | ICD-10-CM | POA: Diagnosis not present

## 2018-01-16 DIAGNOSIS — N183 Chronic kidney disease, stage 3 (moderate): Secondary | ICD-10-CM | POA: Diagnosis not present

## 2018-01-16 DIAGNOSIS — F39 Unspecified mood [affective] disorder: Secondary | ICD-10-CM | POA: Diagnosis not present

## 2018-01-16 DIAGNOSIS — Z6830 Body mass index (BMI) 30.0-30.9, adult: Secondary | ICD-10-CM | POA: Diagnosis not present

## 2018-01-16 DIAGNOSIS — E1122 Type 2 diabetes mellitus with diabetic chronic kidney disease: Secondary | ICD-10-CM | POA: Diagnosis not present

## 2018-01-16 DIAGNOSIS — K5090 Crohn's disease, unspecified, without complications: Secondary | ICD-10-CM | POA: Diagnosis not present

## 2018-01-16 DIAGNOSIS — E038 Other specified hypothyroidism: Secondary | ICD-10-CM | POA: Diagnosis not present

## 2018-02-02 DIAGNOSIS — K5090 Crohn's disease, unspecified, without complications: Secondary | ICD-10-CM | POA: Diagnosis not present

## 2018-02-02 DIAGNOSIS — K5289 Other specified noninfective gastroenteritis and colitis: Secondary | ICD-10-CM | POA: Diagnosis not present

## 2018-02-02 DIAGNOSIS — K5080 Crohn's disease of both small and large intestine without complications: Secondary | ICD-10-CM | POA: Diagnosis not present

## 2018-02-02 DIAGNOSIS — Z980 Intestinal bypass and anastomosis status: Secondary | ICD-10-CM | POA: Diagnosis not present

## 2018-02-02 DIAGNOSIS — K50919 Crohn's disease, unspecified, with unspecified complications: Secondary | ICD-10-CM | POA: Diagnosis not present

## 2018-02-02 DIAGNOSIS — Z9049 Acquired absence of other specified parts of digestive tract: Secondary | ICD-10-CM | POA: Diagnosis not present

## 2018-02-02 DIAGNOSIS — K633 Ulcer of intestine: Secondary | ICD-10-CM | POA: Diagnosis not present

## 2018-02-23 DIAGNOSIS — Z23 Encounter for immunization: Secondary | ICD-10-CM | POA: Diagnosis not present

## 2018-02-25 ENCOUNTER — Encounter (HOSPITAL_COMMUNITY): Payer: Self-pay

## 2018-02-25 ENCOUNTER — Ambulatory Visit (HOSPITAL_COMMUNITY)
Admission: RE | Admit: 2018-02-25 | Discharge: 2018-02-25 | Disposition: A | Discharge status: Home / Self Care | Payer: Medicare Other | Source: Ambulatory Visit | Attending: Oncology | Admitting: Oncology

## 2018-02-25 ENCOUNTER — Inpatient Hospital Stay: Payer: Medicare Other | Attending: Internal Medicine

## 2018-02-25 DIAGNOSIS — K5090 Crohn's disease, unspecified, without complications: Secondary | ICD-10-CM | POA: Insufficient documentation

## 2018-02-25 DIAGNOSIS — E669 Obesity, unspecified: Secondary | ICD-10-CM | POA: Insufficient documentation

## 2018-02-25 DIAGNOSIS — Z79899 Other long term (current) drug therapy: Secondary | ICD-10-CM | POA: Insufficient documentation

## 2018-02-25 DIAGNOSIS — M109 Gout, unspecified: Secondary | ICD-10-CM | POA: Insufficient documentation

## 2018-02-25 DIAGNOSIS — J90 Pleural effusion, not elsewhere classified: Secondary | ICD-10-CM | POA: Insufficient documentation

## 2018-02-25 DIAGNOSIS — Z85828 Personal history of other malignant neoplasm of skin: Secondary | ICD-10-CM | POA: Diagnosis not present

## 2018-02-25 DIAGNOSIS — E039 Hypothyroidism, unspecified: Secondary | ICD-10-CM | POA: Insufficient documentation

## 2018-02-25 DIAGNOSIS — I2510 Atherosclerotic heart disease of native coronary artery without angina pectoris: Secondary | ICD-10-CM | POA: Insufficient documentation

## 2018-02-25 DIAGNOSIS — N200 Calculus of kidney: Secondary | ICD-10-CM | POA: Insufficient documentation

## 2018-02-25 DIAGNOSIS — M810 Age-related osteoporosis without current pathological fracture: Secondary | ICD-10-CM | POA: Diagnosis not present

## 2018-02-25 DIAGNOSIS — E1122 Type 2 diabetes mellitus with diabetic chronic kidney disease: Secondary | ICD-10-CM | POA: Diagnosis not present

## 2018-02-25 DIAGNOSIS — C3412 Malignant neoplasm of upper lobe, left bronchus or lung: Secondary | ICD-10-CM | POA: Diagnosis not present

## 2018-02-25 DIAGNOSIS — M459 Ankylosing spondylitis of unspecified sites in spine: Secondary | ICD-10-CM | POA: Insufficient documentation

## 2018-02-25 DIAGNOSIS — C3492 Malignant neoplasm of unspecified part of left bronchus or lung: Secondary | ICD-10-CM

## 2018-02-25 DIAGNOSIS — I10 Essential (primary) hypertension: Secondary | ICD-10-CM | POA: Insufficient documentation

## 2018-02-25 DIAGNOSIS — J479 Bronchiectasis, uncomplicated: Secondary | ICD-10-CM | POA: Diagnosis not present

## 2018-02-25 DIAGNOSIS — Z902 Acquired absence of lung [part of]: Secondary | ICD-10-CM | POA: Insufficient documentation

## 2018-02-25 DIAGNOSIS — C3490 Malignant neoplasm of unspecified part of unspecified bronchus or lung: Secondary | ICD-10-CM | POA: Diagnosis not present

## 2018-02-25 LAB — COMPREHENSIVE METABOLIC PANEL
ALT: 20 U/L (ref 0–44)
AST: 24 U/L (ref 15–41)
Albumin: 3.5 g/dL (ref 3.5–5.0)
Alkaline Phosphatase: 100 U/L (ref 38–126)
Anion gap: 9 (ref 5–15)
BUN: 23 mg/dL (ref 8–23)
CO2: 25 mmol/L (ref 22–32)
Calcium: 9.6 mg/dL (ref 8.9–10.3)
Chloride: 107 mmol/L (ref 98–111)
Creatinine, Ser: 1.47 mg/dL — ABNORMAL HIGH (ref 0.61–1.24)
GFR calc Af Amer: 52 mL/min — ABNORMAL LOW (ref >60)
GFR calc non Af Amer: 45 mL/min — ABNORMAL LOW (ref >60)
Glucose, Bld: 139 mg/dL — ABNORMAL HIGH (ref 70–99)
Potassium: 4.7 mmol/L (ref 3.5–5.1)
Sodium: 141 mmol/L (ref 135–145)
Total Bilirubin: 0.3 mg/dL (ref 0.3–1.2)
Total Protein: 6.9 g/dL (ref 6.5–8.1)

## 2018-02-25 LAB — CBC WITH DIFFERENTIAL (CANCER CENTER ONLY)
Basophils Absolute: 0 10*3/uL (ref 0.0–0.1)
Basophils Relative: 0 %
Eosinophils Absolute: 0.1 10*3/uL (ref 0.0–0.5)
Eosinophils Relative: 1 %
HCT: 36 % — ABNORMAL LOW (ref 38.4–49.9)
Hemoglobin: 11.6 g/dL — ABNORMAL LOW (ref 13.0–17.1)
Lymphocytes Relative: 14 %
Lymphs Abs: 1.7 10*3/uL (ref 0.9–3.3)
MCH: 30.4 pg (ref 27.2–33.4)
MCHC: 32.2 g/dL (ref 32.0–36.0)
MCV: 94.2 fL (ref 79.3–98.0)
Monocytes Absolute: 0.7 10*3/uL (ref 0.1–0.9)
Monocytes Relative: 6 %
Neutro Abs: 9.5 10*3/uL — ABNORMAL HIGH (ref 1.5–6.5)
Neutrophils Relative %: 79 %
Platelet Count: 256 10*3/uL (ref 140–400)
RBC: 3.82 MIL/uL — ABNORMAL LOW (ref 4.20–5.82)
RDW: 13.5 % (ref 11.0–14.6)
WBC Count: 12.1 10*3/uL — ABNORMAL HIGH (ref 4.0–10.3)

## 2018-03-02 ENCOUNTER — Telehealth: Payer: Self-pay

## 2018-03-02 ENCOUNTER — Encounter: Payer: Self-pay | Admitting: Internal Medicine

## 2018-03-02 ENCOUNTER — Inpatient Hospital Stay (HOSPITAL_BASED_OUTPATIENT_CLINIC_OR_DEPARTMENT_OTHER): Payer: Medicare Other | Admitting: Internal Medicine

## 2018-03-02 VITALS — BP 121/90 | HR 78 | Temp 98.1°F | Resp 18 | Ht 68.5 in | Wt 205.6 lb

## 2018-03-02 DIAGNOSIS — E669 Obesity, unspecified: Secondary | ICD-10-CM | POA: Diagnosis not present

## 2018-03-02 DIAGNOSIS — M109 Gout, unspecified: Secondary | ICD-10-CM | POA: Diagnosis not present

## 2018-03-02 DIAGNOSIS — Z85828 Personal history of other malignant neoplasm of skin: Secondary | ICD-10-CM

## 2018-03-02 DIAGNOSIS — M810 Age-related osteoporosis without current pathological fracture: Secondary | ICD-10-CM | POA: Diagnosis not present

## 2018-03-02 DIAGNOSIS — J90 Pleural effusion, not elsewhere classified: Secondary | ICD-10-CM | POA: Diagnosis not present

## 2018-03-02 DIAGNOSIS — E1122 Type 2 diabetes mellitus with diabetic chronic kidney disease: Secondary | ICD-10-CM

## 2018-03-02 DIAGNOSIS — N200 Calculus of kidney: Secondary | ICD-10-CM

## 2018-03-02 DIAGNOSIS — Z79899 Other long term (current) drug therapy: Secondary | ICD-10-CM

## 2018-03-02 DIAGNOSIS — J479 Bronchiectasis, uncomplicated: Secondary | ICD-10-CM | POA: Diagnosis not present

## 2018-03-02 DIAGNOSIS — E039 Hypothyroidism, unspecified: Secondary | ICD-10-CM

## 2018-03-02 DIAGNOSIS — K5090 Crohn's disease, unspecified, without complications: Secondary | ICD-10-CM | POA: Diagnosis not present

## 2018-03-02 DIAGNOSIS — C3412 Malignant neoplasm of upper lobe, left bronchus or lung: Secondary | ICD-10-CM

## 2018-03-02 DIAGNOSIS — I10 Essential (primary) hypertension: Secondary | ICD-10-CM | POA: Diagnosis not present

## 2018-03-02 DIAGNOSIS — M459 Ankylosing spondylitis of unspecified sites in spine: Secondary | ICD-10-CM | POA: Diagnosis not present

## 2018-03-02 DIAGNOSIS — C3490 Malignant neoplasm of unspecified part of unspecified bronchus or lung: Secondary | ICD-10-CM

## 2018-03-02 NOTE — Progress Notes (Signed)
[facility] [phone]   [phone]) [phone]  OFFICE PROGRESS NOTE  [place], MD [place] [address]  DIAGNOSIS: Stage IA (T1b, N0, M0) non-small cell lung cancer, adenocarcinoma presenting with left upper lobe pulmonary nodule diagnosed in September 2016.  PRIOR THERAPY: Status post left VATS with left upper lobectomy and mediastinal lymph node dissection under the care of Dr. Prescott Gum on 03/16/2015.  CURRENT THERAPY: Observation.  INTERVAL HISTORY: Devin [name] 77 y.o. male returns to the clinic today for follow-up visit.  The patient is feeling fine today with no concerning complaints.  He denied having any chest pain, shortness breath, cough or hemoptysis.  He denied having any fever or chills.  He has no nausea, vomiting, diarrhea or constipation.  He is here today for evaluation with repeat CT scan of the chest for restaging of his disease.  MEDICAL HISTORY: Past Medical History:  Diagnosis Date  . Adenocarcinoma of left lung, stage 1 ([facility]) 03/10/2015  . Ankylosing spondylitis ([facility])    Diagnosed during lumbar fracture summer of 2016    . Crohn's disease ([facility])   . Gout   . HIstory of basal cell cancer of face    THYROID CA HX  . Hypertension   . Hypocalcemia 04/13/2007  . Hypothyroidism   . Impotence   . 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 Obesity (BMI 30-39.9)   . Osteoporosis    Pt completed 5 years of fosamax in 2013     . Prostate cancer with recurrence    Treated with prostatectomy with recurrence 2012 with Lupron treatment now him   . Psoriasis   . Thyroid cancer ([facility]) 1999   Treated with RAI and total thyroidectomy   . Type II diabetes mellitus ([facility])     ALLERGIES:  has No Known Allergies.  MEDICATIONS:  Current Outpatient Medications  Medication Sig Dispense Refill  . acetaminophen (TYLENOL) 500 MG tablet Take 1,000 mg by mouth 2 (two) times daily.    Marland Kitchen ALPRAZolam  (XANAX) 0.5 MG tablet TK 1 T PO Q 6 H PRN  0  . ASACOL HD 800 MG TBEC Take 1,600 mg by mouth 2 (two) times daily.  3  . atenolol (TENORMIN) 25 MG tablet Take 0.5 mg by mouth.    . B-D ULTRAFINE III SHORT PEN 31G X 8 MM MISC USE 1 PEN NEEDLE FOUR TIMES DAILY. 100 each 0  . busPIRone (BUSPAR) 15 MG tablet Take 15 mg by mouth.    . calcium carbonate (OS-CAL) 1250 (500 CA) MG chewable tablet Chew 1 tablet by mouth 2 (two) times daily.    . cefdinir (OMNICEF) 300 MG capsule TK ONE C PO BID FOR 10 DAYS  0  . cholestyramine light (PREVALITE) 4 g packet Take by mouth.    . fluorouracil (EFUDEX) 5 % cream APP ON SKIN BID FOR 2 WEEKS  2  . Glucose Blood (ASCENSIA CONTOUR TEST VI) Use as directed to test two times a day     . glucose blood (BAYER CONTOUR TEST) test strip TEST BLOOD SUGAR TWICE DAILY AS DIRECTED. 200 each 2  . glucose blood ([employer] TEST STRIPS) test strip 1 each by Other route 2 times daily.    . insulin lispro (HUMALOG KWIKPEN) 100 UNIT/ML KiwkPen INJECT UNDER THE SKIN THREE TIMES DAILY( 25 UNITS, 25 UNITS AND 20 UNITS RESPECTIVELY BEFORE MEALS) 30 mL 5  . insulin lispro (HUMALOG) 100 UNIT/ML  KiwkPen Inject into the skin.    . Insulin Pen Needle (FIFTY50 PEN NEEDLES) 31G X 8 MM MISC Inject 31 g into the skin.    Marland Kitchen leuprolide (LUPRON) 11.25 MG injection Inject 11.25 mg into the muscle every 6 (six) months.     . levothyroxine (SYNTHROID, LEVOTHROID) 200 MCG tablet TK 1 TP O QD ON AN EMPTY STOMACH WITH A GLASS OF WATER  2  . lisinopril-hydrochlorothiazide (PRINZIDE,ZESTORETIC) 20-12.5 MG tablet Take 1 tablet by mouth daily. Needs office visit for additional refills 90 tablet 0  . Multiple Vitamins-Iron (DAILY-VITE/IRON/BETA-CAROTENE) TABS Take by mouth.    . Multiple Vitamins-Minerals (CENTRUM SILVER) tablet Take 1 tablet by mouth daily.    Marland Kitchen scopolamine (TRANSDERM-SCOP) 1 MG/3DAYS APP 1 PA BEHIND EAR 1 HOUR BEFORE EMBARACATION AND CHANGE Q 3 DAYS  0  . triamcinolone cream (KENALOG) 0.1 %  Apply 1 application topically 2 (two) times daily. 30 g 0   No current facility-administered medications for this visit.     SURGICAL HISTORY:  Past Surgical History:  Procedure Laterality Date  . ABDOMINAL EXPLORATION SURGERY     for small bowel obstruction  . APPENDECTOMY  03/2007  . BACK SURGERY    . BASAL CELL CARCINOMA EXCISION  "several"   "head"  . CARDIAC CATHETERIZATION  03/17/2003  . CHOLECYSTECTOMY N/A 05/07/2016   Procedure: LAPAROSCOPIC CHOLECYSTECTOMY;  Surgeon: Fanny Skates, MD;  Location: [facility];  Service: General;  Laterality: N/A;  . COLON SURGERY  03/2007   Resection of cecum, appendix, terminal ileum (approximately/notes 10/10/2010  . HERNIA REPAIR    . LAPAROSCOPIC CHOLECYSTECTOMY  05/07/2016  . LAPAROSCOPIC LYSIS OF ADHESIONS  05/07/2016  . LAPAROSCOPIC LYSIS OF ADHESIONS N/A 05/07/2016   Procedure: LAPAROSCOPIC LYSIS OF ADHESIONS TIMES ONE HOUR;  Surgeon: Fanny Skates, MD;  Location: [facility];  Service: General;  Laterality: N/A;  . POSTERIOR FUSION THORACIC SPINE  02/08/2016   1. Posterior thoracic arthrodesis T7-T11 utilizing morcellized allograft, 2. Posterior thoracic segmental fixation T7-T11 utilizing nuvasive pedicle screws  . PROSTATECTOMY  06/2001   w/bilateral pelvic lymph nose dissection/notes 10/24/2010  . SPINAL FUSION  12/2014   Open reduction internal fixation of L1 Chance fracture with posterior fusion T10-L4 utilizing morcellized allograft and some local autograft, segmental instrumentation T10-L4 inclusive utilizing nuvasive pedicle screws/notes 12/16/2014  . Stress Cardiolite  02/17/2003  . THOROCOTOMY WITH LOBECTOMY  03/16/2015   Procedure: THOROCOTOMY WITH LOBECTOMY;  Surgeon: Ivin Poot, MD;  Location: [facility];  Service: Thoracic;;  . TONSILLECTOMY    . TOTAL THYROIDECTOMY  1997  . Venous Doppler  05/30/2004  . VENTRAL HERNIA REPAIR  04/14/2008  . VIDEO ASSISTED THORACOSCOPY Left 03/16/2015   Procedure: VIDEO ASSISTED THORACOSCOPY;   Surgeon: Ivin Poot, MD;  Location: [facility] OR;  Service: Thoracic;  Laterality: Left;    REVIEW OF SYSTEMS:  A comprehensive review of systems was negative.   PHYSICAL EXAMINATION: General appearance: alert, cooperative and no distress Head: Normocephalic, without obvious abnormality, atraumatic Neck: no adenopathy, no JVD, supple, symmetrical, trachea midline and thyroid not enlarged, symmetric, no tenderness/mass/nodules Lymph nodes: Cervical, supraclavicular, and axillary nodes normal. Resp: clear to auscultation bilaterally Back: symmetric, no curvature. ROM normal. No CVA tenderness. Cardio: regular rate and rhythm, S1, S2 normal, no murmur, click, rub or gallop GI: soft, non-tender; bowel sounds normal; no masses,  no organomegaly Extremities: extremities normal, atraumatic, no cyanosis or edema  ECOG PERFORMANCE STATUS: 1 - Symptomatic but completely ambulatory  Blood pressure 121/90, pulse 78,  temperature 98.1 F (36.7 C), temperature source Oral, resp. rate 18, height 5' 8.5" (1.74 m), weight 205 lb 9.6 oz (93.3 kg), SpO2 99 %.  LABORATORY DATA: Lab Results  Component Value Date   WBC 12.1 (H) 02/25/2018   HGB 11.6 (L) 02/25/2018   HCT 36.0 (L) 02/25/2018   MCV 94.2 02/25/2018   PLT 256 02/25/2018      Chemistry      Component Value Date/Time   NA 141 02/25/2018 0953   NA 140 02/18/2017 1011   K 4.7 02/25/2018 0953   K 4.5 02/18/2017 1011   CL 107 02/25/2018 0953   CO2 25 02/25/2018 0953   CO2 22 02/18/2017 1011   BUN 23 02/25/2018 0953   BUN 32.1 (H) 02/18/2017 1011   CREATININE 1.47 (H) 02/25/2018 0953   CREATININE 1.9 (H) 02/18/2017 1011      Component Value Date/Time   CALCIUM 9.6 02/25/2018 0953   CALCIUM 9.8 02/18/2017 1011   ALKPHOS 100 02/25/2018 0953   ALKPHOS 102 02/18/2017 1011   AST 24 02/25/2018 0953   AST 35 (H) 02/18/2017 1011   ALT 20 02/25/2018 0953   ALT 35 02/18/2017 1011   BILITOT 0.3 02/25/2018 0953   BILITOT 0.31 02/18/2017 1011         RADIOGRAPHIC STUDIES: Ct Chest Wo Contrast  Result Date: 02/25/2018 CLINICAL DATA:  Left lung cancer, prostate cancer, remote history of thyroid cancer. EXAM: CT CHEST WITHOUT CONTRAST TECHNIQUE: Multidetector CT imaging of the chest was performed following the standard protocol without IV contrast. COMPARISON:  02/18/2017. FINDINGS: Cardiovascular: Heart size normal. Coronary artery calcification. No pericardial effusion. Mediastinum/Nodes: No pathologically enlarged mediastinal or axillary lymph nodes. Hilar regions are difficult to definitively evaluate without IV contrast but appear grossly unremarkable. Esophagus is grossly unremarkable. Lungs/Pleura: Mild bronchiectasis and scarring in the right middle lobe and right lower lobe. Nodules in the left lower lobe measure up to 4 mm, stable 10/10/2015 and considered benign. Left upper lobectomy. Left pleural effusion is moderate in size and has increased from 02/18/2017. There is associated pleural thickening, indicative chronicity. Subpleural atelectasis/scarring in the left lower lobe. Airway is otherwise unremarkable. Upper Abdomen: Visualized portions of the liver and adrenal glands are unremarkable. Stone or renal vascular calcification in the right kidney. Visualized portions of the kidneys, spleen, pancreas and stomach are otherwise grossly unremarkable. Duodenal diverticulum is incidentally noted. Cholecystectomy. No upper abdominal adenopathy. Musculoskeletal: Postoperative and degenerative changes in the spine. No worrisome lytic or sclerotic lesions. IMPRESSION: 1. Left upper lobectomy. No evidence of recurrence or metastatic disease. 2. Moderate left pleural effusion, increased from 02/18/2017. 3. Coronary artery calcification. 4. Right renal stone or vascular calcification. Electronically Signed   By: Lorin Picket M.D.   On: 02/25/2018 13:31    ASSESSMENT AND PLAN:  This is a very pleasant 77 years old white male with a stage Ia  non-small cell lung cancer diagnosed in September 2016 status post left upper lobectomy and has been observation since that time. The patient continues to do well with no concerning complaints. Repeat CT scan of the chest showed no concerning findings for disease progression but there was moderate size left pleural effusion increased from the previous scan. I discussed the scan results with the patient and recommended for him to continue on observation with repeat CT scan of the chest in 6 months for reevaluation of his disease and close monitoring of the left pleural effusion. He was advised to call immediately if he has  any concerning symptoms in the interval. The patient voices understanding of current disease status and treatment options and is in agreement with the current care plan. All questions were answered. The patient knows to call the clinic with any problems, questions or concerns. We can certainly see the patient much sooner if necessary. I spent 10 minutes counseling the patient face to face. The total time spent in the appointment was 15 minutes.  Disclaimer: This note was dictated with voice recognition software. Similar sounding words can inadvertently be transcribed and may not be corrected upon review.

## 2018-03-02 NOTE — Telephone Encounter (Signed)
Printed avs and calender of upcoming appointment. Per 9/23 los

## 2018-03-09 IMAGING — US US ABDOMEN COMPLETE
1 series · 14 of 25 positions shown · non-contrast
Comparison: CT chest January 10, 2016

CLINICAL DATA: Upper abdominal pain and tenderness. History of
abdominal surgery, lung cancer.

EXAM:
ABDOMEN ULTRASOUND COMPLETE

[Series 1: us abdomen complete · 0.25mm/px · 14 of 113 slices shown]
[im 1/113]
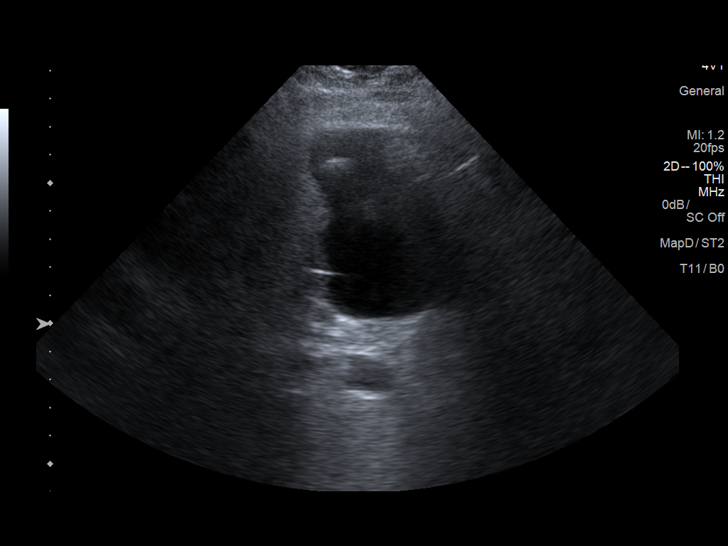
[im 10/113]
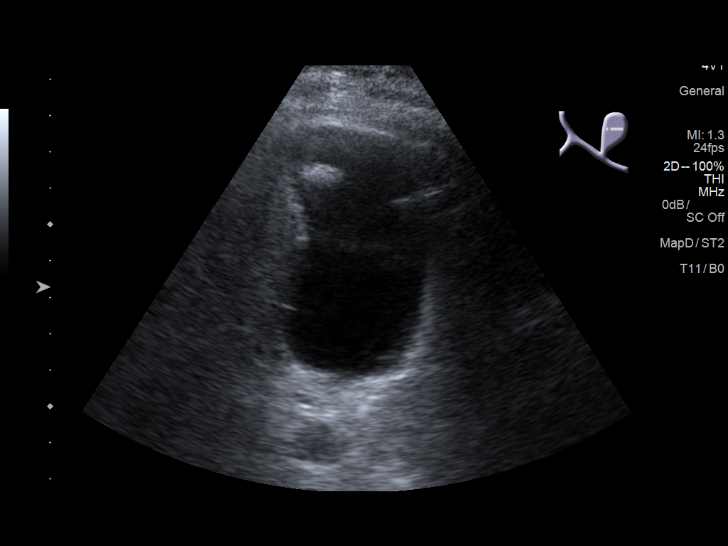
[im 19/113]
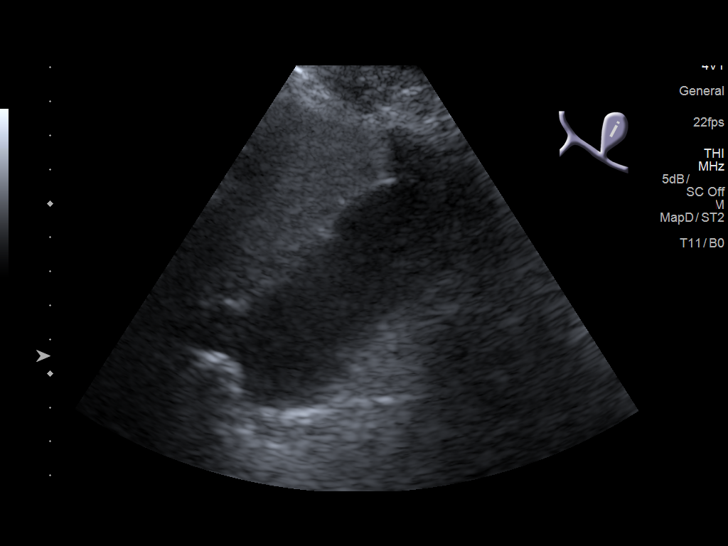
[im 29/113]
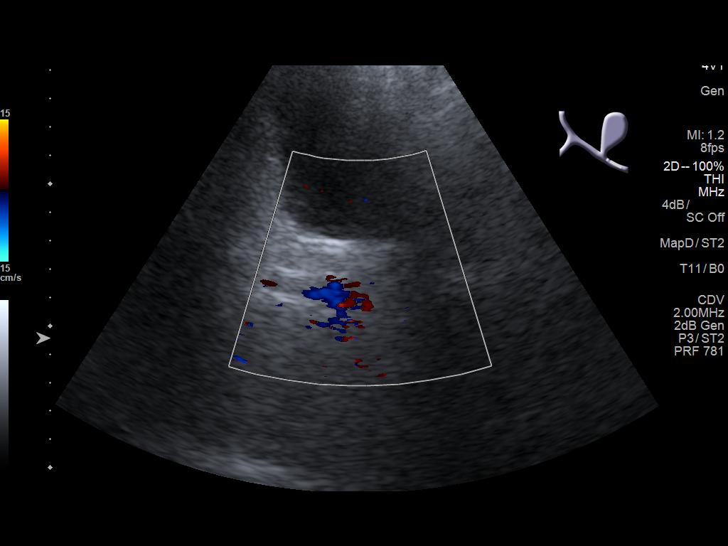
[im 38/113]
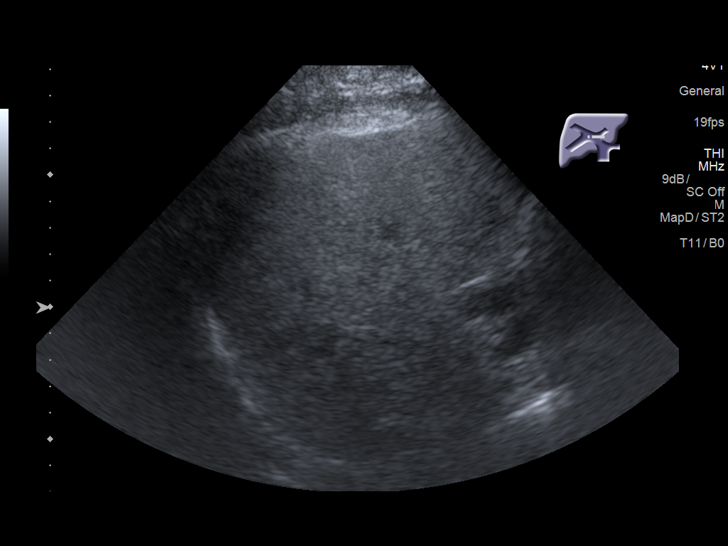
[im 43/113]
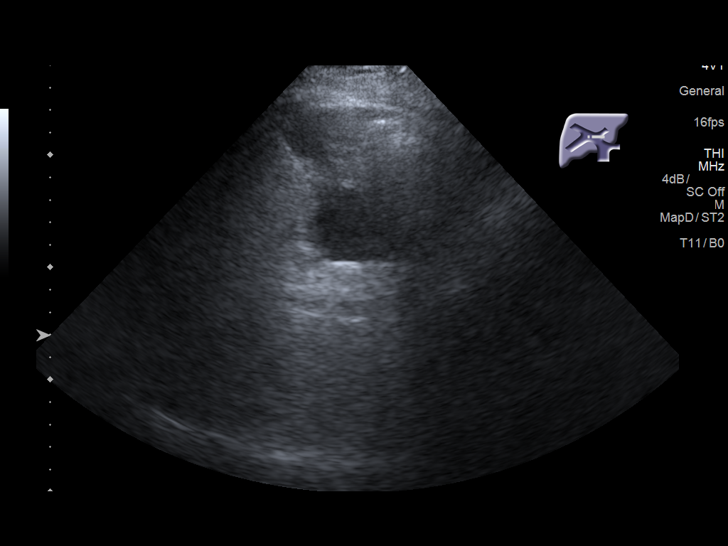
[im 52/113]
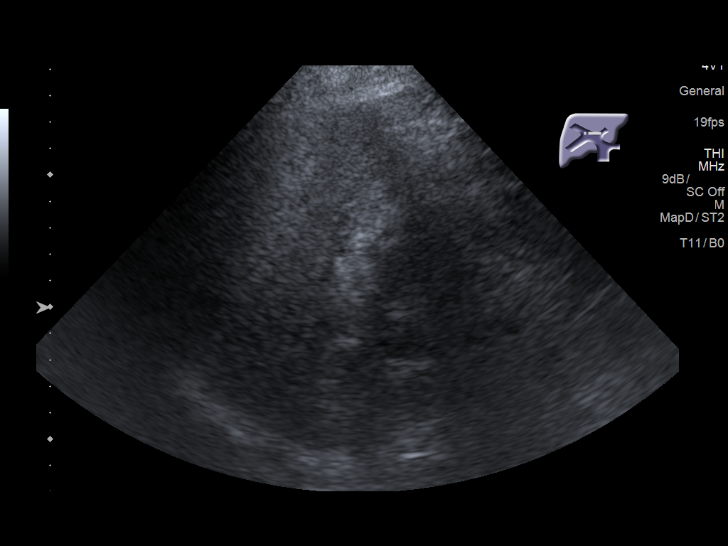
[im 61/113]
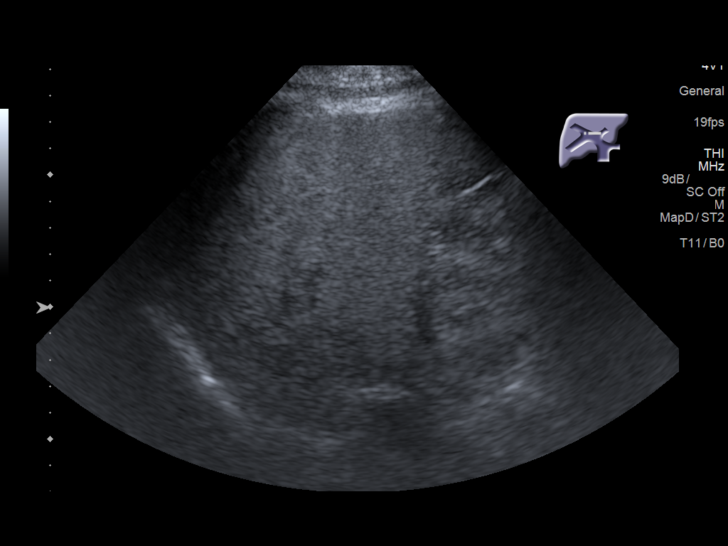
[im 71/113]
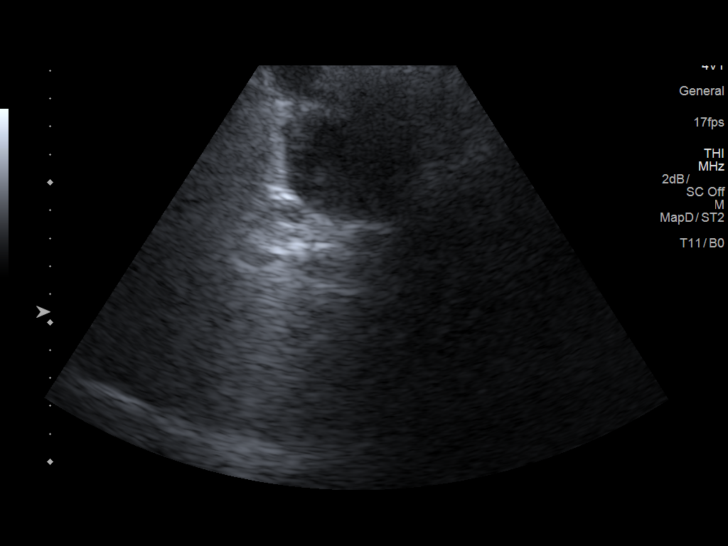
[im 75/113]
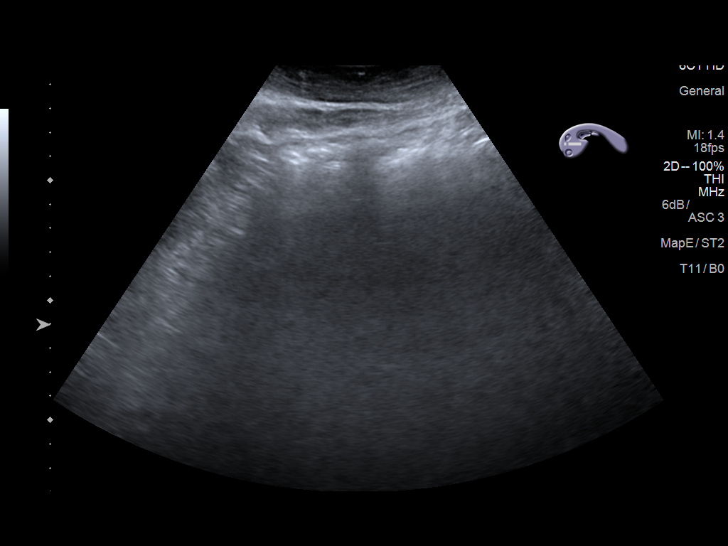
[im 85/113]
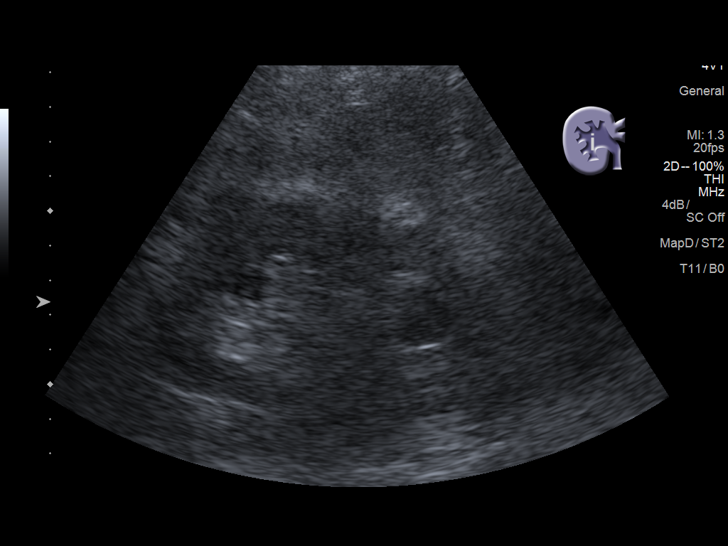
[im 94/113]
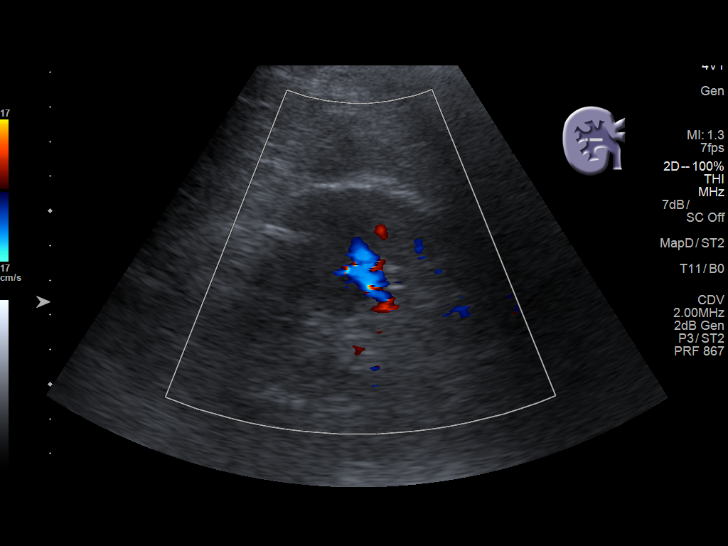
[im 103/113]
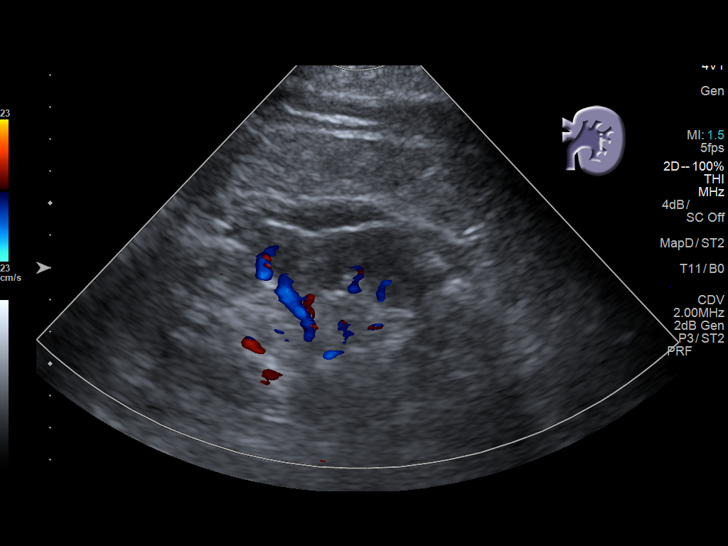
[im 113/113]
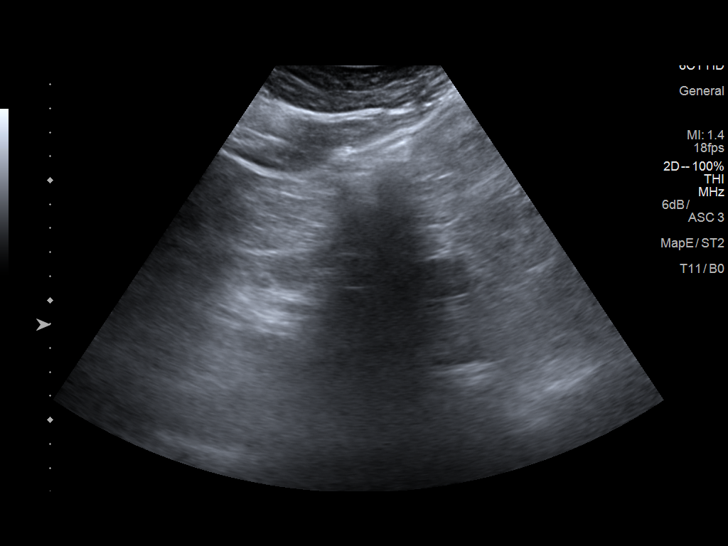

[14 of 25 positions shown; findings below may reference images not displayed]

FINDINGS: Examination limited by bowel gas. Due to recent fall, patient was
unable to lie flat.

Gallbladder: Echogenic 15 mm gallstone with acoustic shadowing. 2-4
mm gallbladder walls. No pericholecystic fluid. No sonographic
Murphy's sign elicited.

Common bile duct: Diameter: 7 mm

Liver: Echogenic, hepatopetal portal vein.

IVC: Not sonographically identified.

Pancreas: Not sonographically identified.

Spleen: Size and appearance within normal limits.

Right Kidney: Length: 10.3 cm. Echogenicity within normal limits. No
mass or hydronephrosis visualized.

Left Kidney: Length: 11 cm. Echogenicity within normal limits. No
mass or hydronephrosis visualized.

Abdominal aorta: Not sonographically identified.

Other findings: None.
IMPRESSION: Limit assessment due to bowel gas and patient habitus.

Cholelithiasis without sonographic findings of acute cholecystitis.

## 2018-03-14 IMAGING — CT CT CHEST W/O CM
2 of 3 series · 15 of 36 positions shown, 18 images · non-contrast
Comparison: 10/10/15

CLINICAL DATA: Status post left upper lobectomy.

EXAM:
CT CHEST WITHOUT CONTRAST
TECHNIQUE: Multidetector CT imaging of the chest was performed following the
standard protocol without IV contrast.

[Series 2: chest w · axial · 0.79mm/px · z∈[-292,-12]mm · 12 of 66 slices shown, 15 images]
[im 5/66  mediastinal]
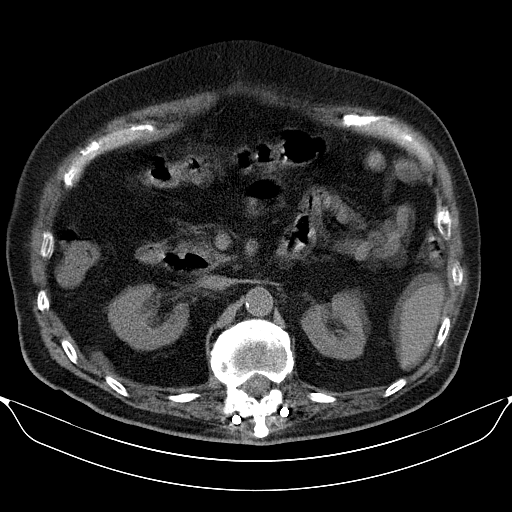
[im 5/66  lung]
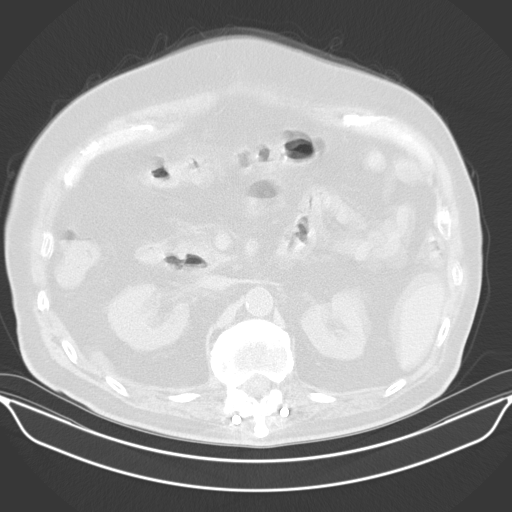
[im 10/66  lung]
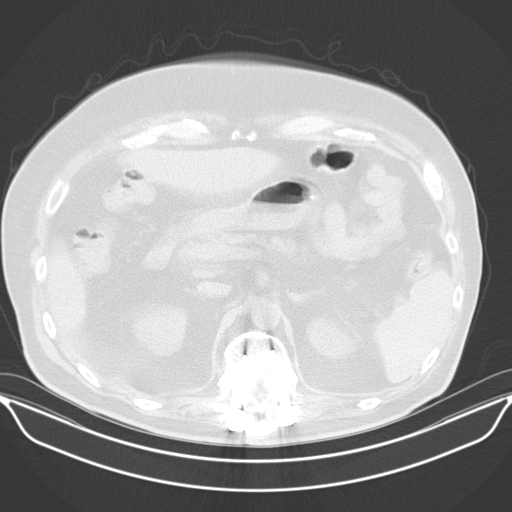
[im 15/66  lung]
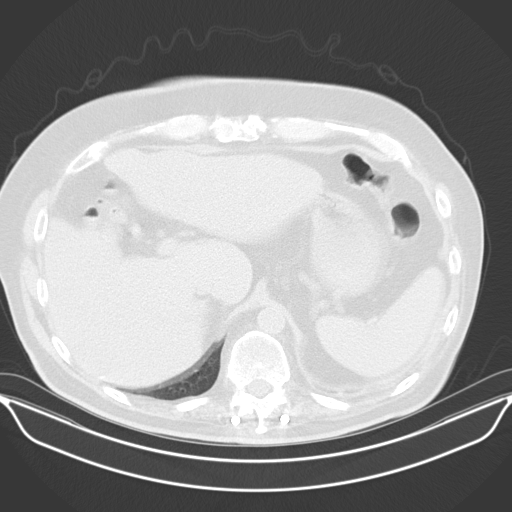
[im 20/66  lung]
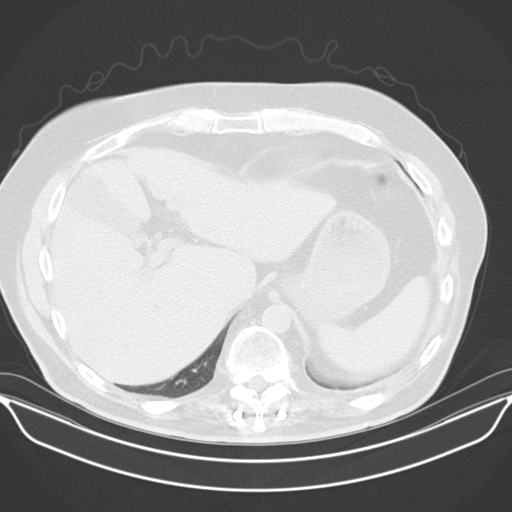
[im 25/66  mediastinal]
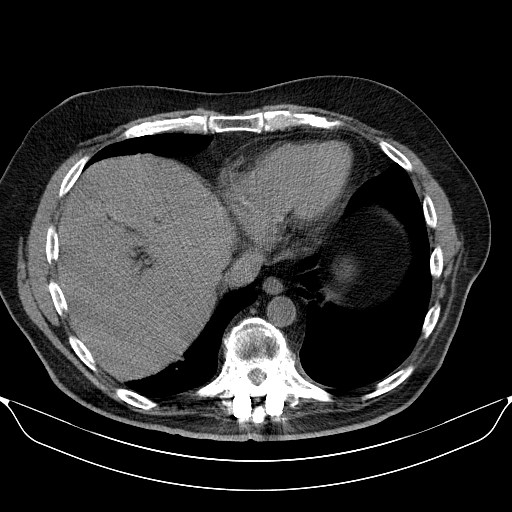
[im 25/66  lung]
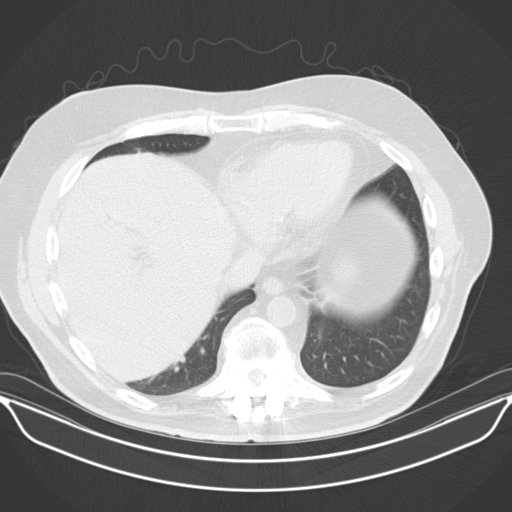
[im 29/66  lung]
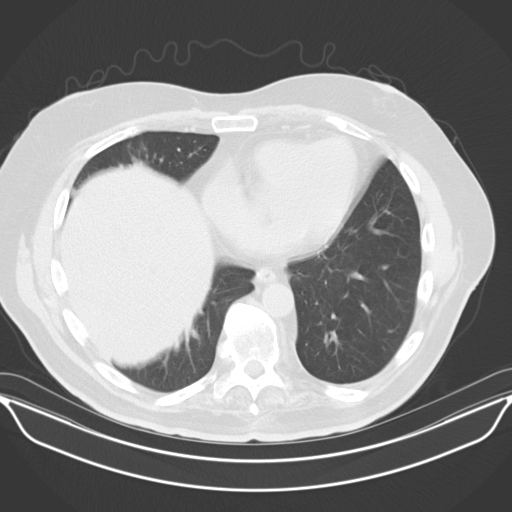
[im 37/66  lung]
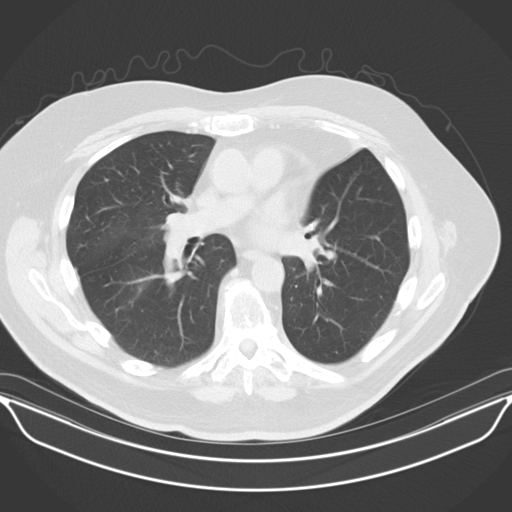
[im 41/66  lung]
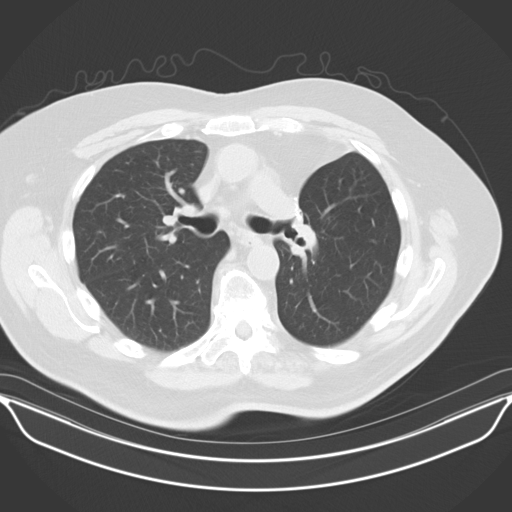
[im 46/66  mediastinal]
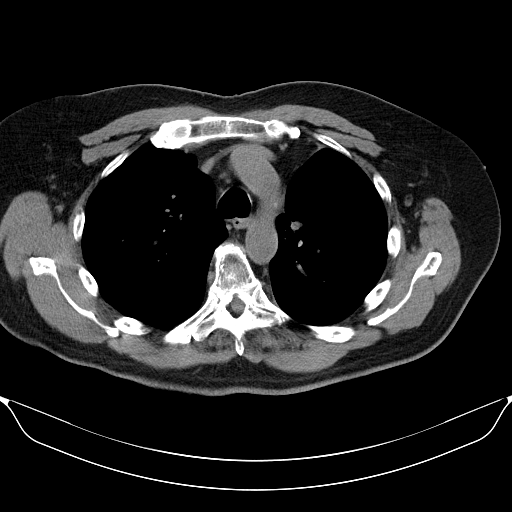
[im 46/66  lung]
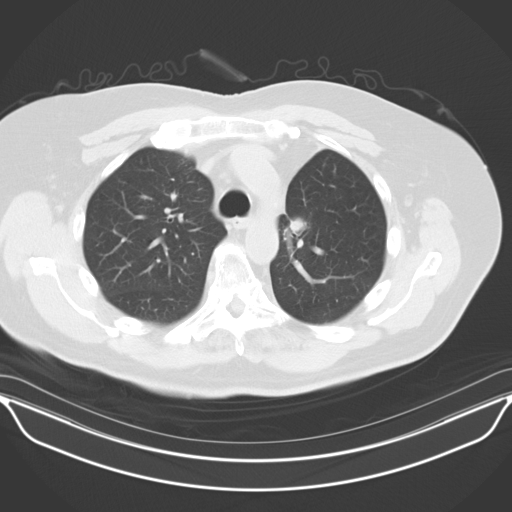
[im 51/66  lung]
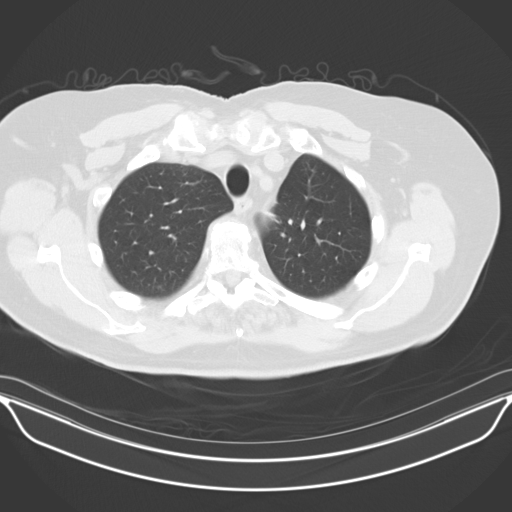
[im 56/66  lung]
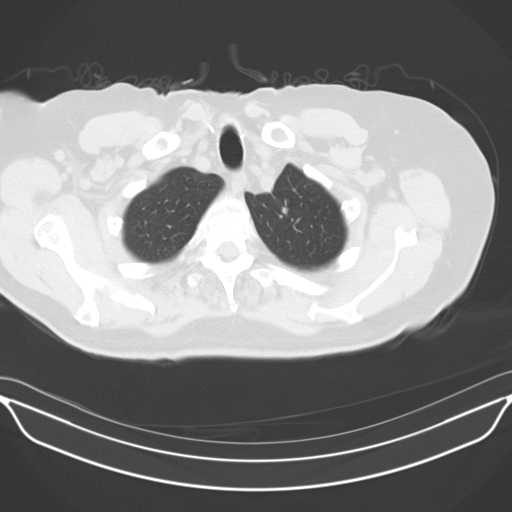
[im 61/66  lung]
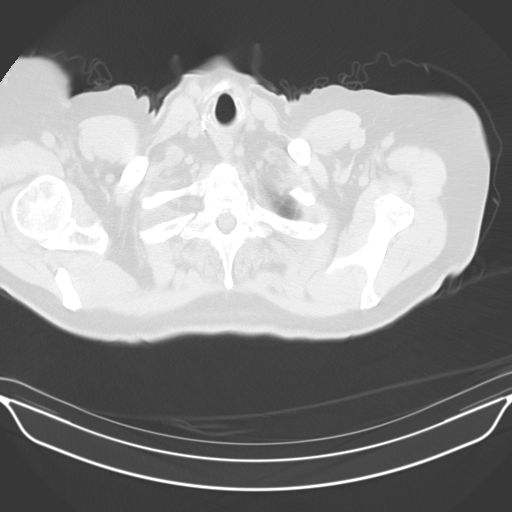

[Series 602: <mpr thick range> · coronal · 0.79mm/px · 3 of 151 slices shown]
[im 31/151  lung]
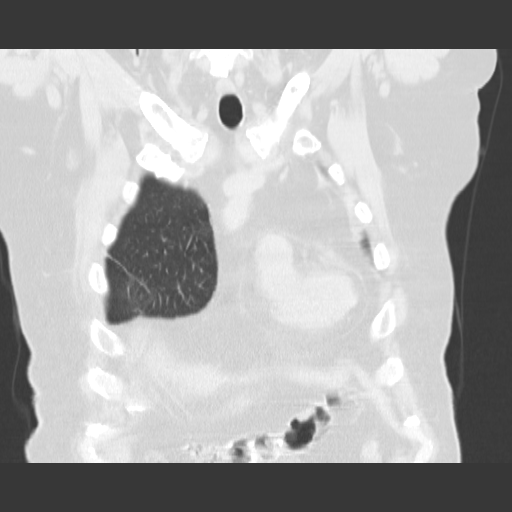
[im 61/151  lung]
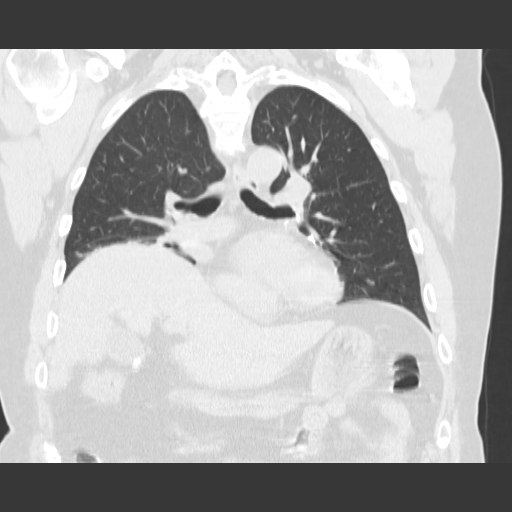
[im 91/151  lung]
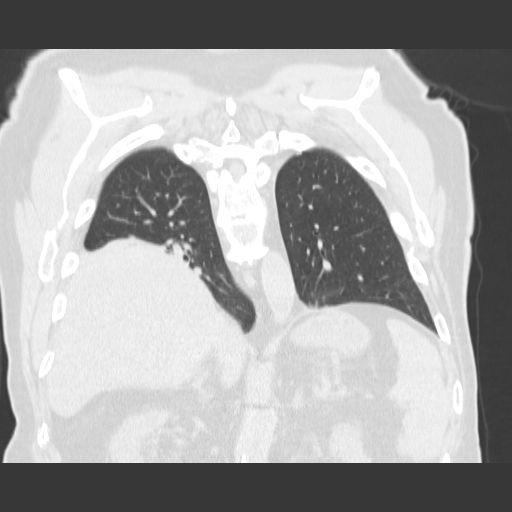

[15 of 36 positions shown; findings below may reference images not displayed]

FINDINGS: Cardiovascular: Heart size is normal.  No pericardial effusion.

Mediastinum/Nodes: Normal appearance of the trachea and esophagus.
No mediastinal or hilar adenopathy. No axillary or supraclavicular
adenopathy.

Lungs/Pleura: Status post left upper lobectomy. Stable appearance of
WINANS of small nodular density in the left upper lung zone measuring 4
mm, image 11 of series 4.No new or enlarging suspicious pulmonary
nodule or mass identified.

Upper Abdomen: The adrenal glands are normal. There is a stone
within the gallbladder neck which measures 11 mm. Right renal
calculus is also noted measuring 4 mm. The visualized portions of
the pancreas and spleen are normal.

Musculoskeletal: No aggressive lytic or sclerotic bone lesions. The
patient is status post posterior hardware fixation of the lower
thoracic and lumbar spine.
IMPRESSION: 1. Stable CT of the chest. No definite evidence for local tumor
recurrence or metastatic disease status post left upper lobectomy.

## 2018-03-30 DIAGNOSIS — D1801 Hemangioma of skin and subcutaneous tissue: Secondary | ICD-10-CM | POA: Diagnosis not present

## 2018-03-30 DIAGNOSIS — C44229 Squamous cell carcinoma of skin of left ear and external auricular canal: Secondary | ICD-10-CM | POA: Diagnosis not present

## 2018-03-30 DIAGNOSIS — L0109 Other impetigo: Secondary | ICD-10-CM | POA: Diagnosis not present

## 2018-03-30 DIAGNOSIS — Z85828 Personal history of other malignant neoplasm of skin: Secondary | ICD-10-CM | POA: Diagnosis not present

## 2018-03-30 DIAGNOSIS — L820 Inflamed seborrheic keratosis: Secondary | ICD-10-CM | POA: Diagnosis not present

## 2018-03-30 DIAGNOSIS — L929 Granulomatous disorder of the skin and subcutaneous tissue, unspecified: Secondary | ICD-10-CM | POA: Diagnosis not present

## 2018-03-30 DIAGNOSIS — D485 Neoplasm of uncertain behavior of skin: Secondary | ICD-10-CM | POA: Diagnosis not present

## 2018-03-30 DIAGNOSIS — D225 Melanocytic nevi of trunk: Secondary | ICD-10-CM | POA: Diagnosis not present

## 2018-03-30 DIAGNOSIS — L570 Actinic keratosis: Secondary | ICD-10-CM | POA: Diagnosis not present

## 2018-03-30 DIAGNOSIS — L0889 Other specified local infections of the skin and subcutaneous tissue: Secondary | ICD-10-CM | POA: Diagnosis not present

## 2018-03-30 DIAGNOSIS — L821 Other seborrheic keratosis: Secondary | ICD-10-CM | POA: Diagnosis not present

## 2018-03-30 IMAGING — CR DG SACRUM/COCCYX 2+V
3 series · 3 of 3 positions shown · non-contrast
Comparison: PET-CT, 03/08/2015

CLINICAL DATA: Patient fell 2 days ago. Pain in the right low back
and right hip and coccyx region. History of lumbar back surgery.

EXAM:
SACRUM AND COCCYX - 2+ VIEW

[t sacrum a.p.]
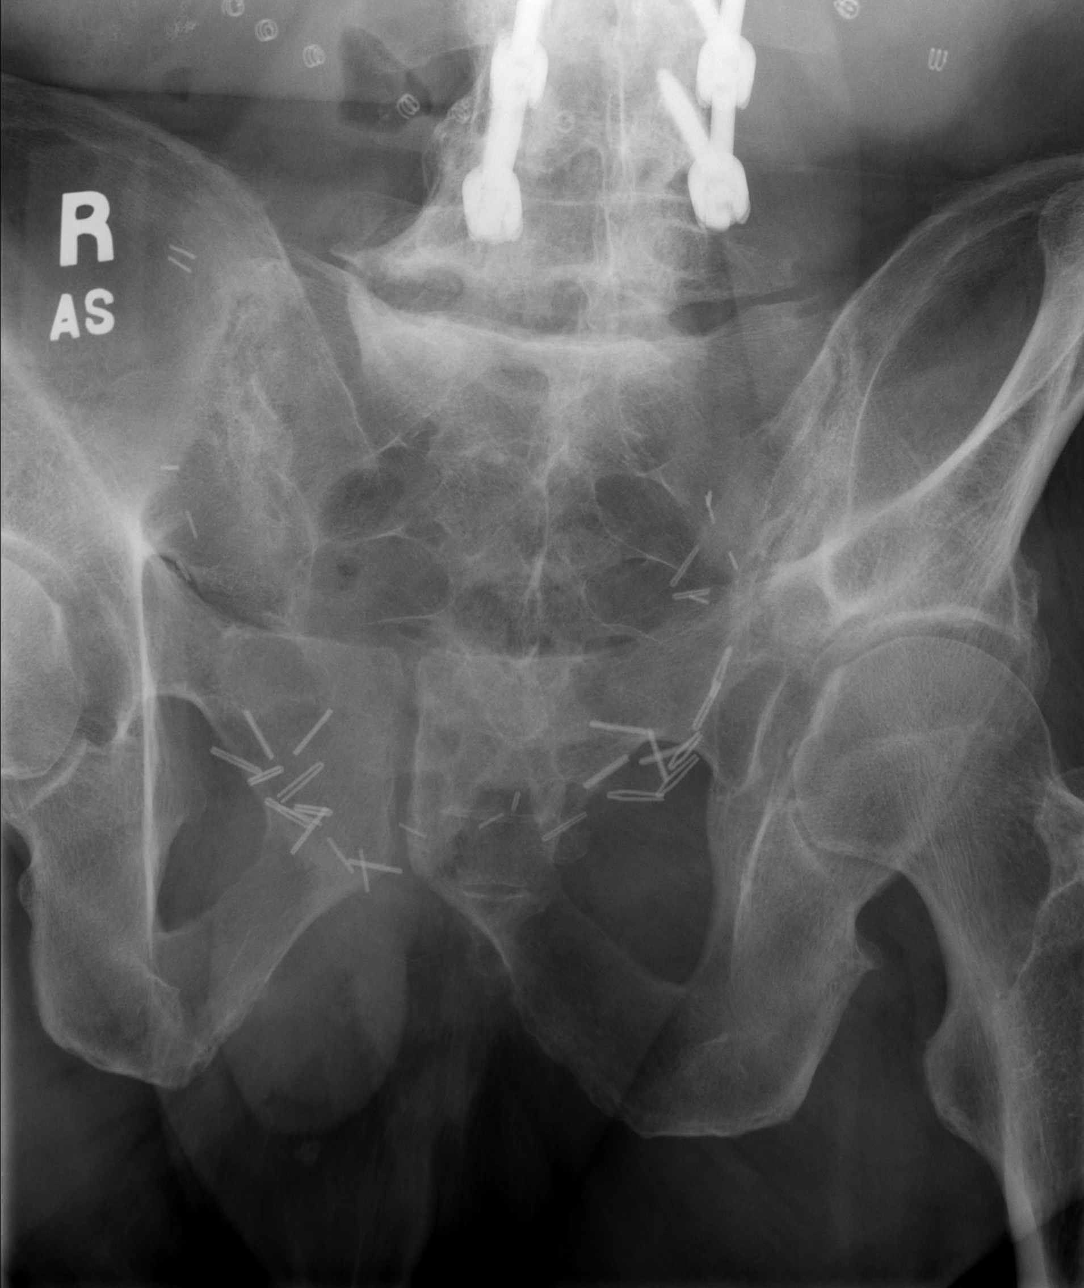

[t coccyx a.p.]
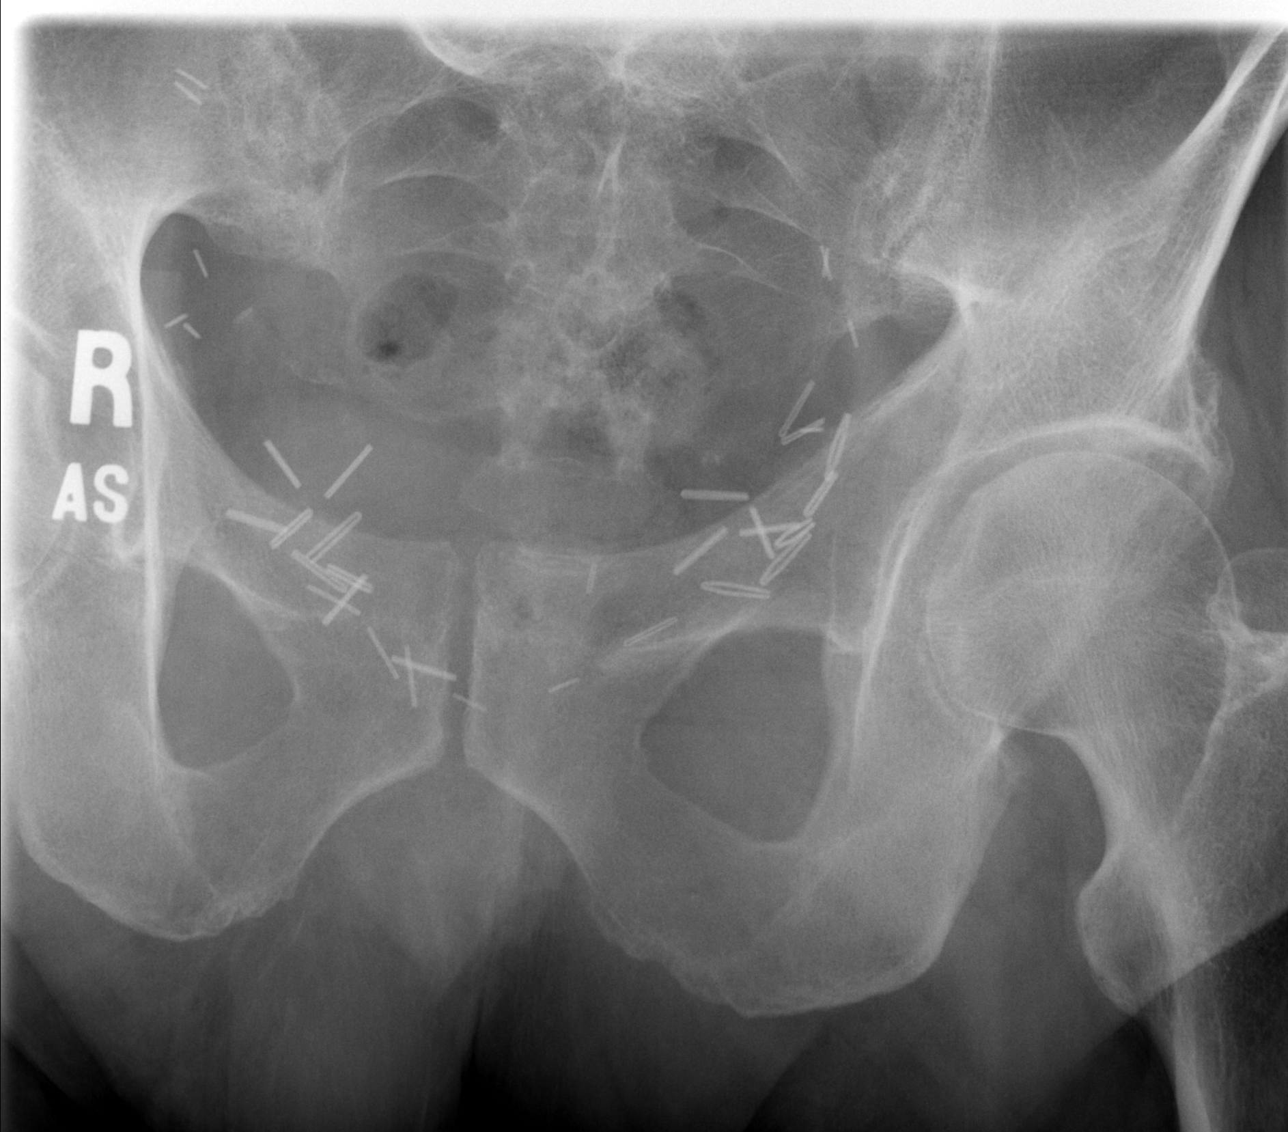

[t coccyx lat]
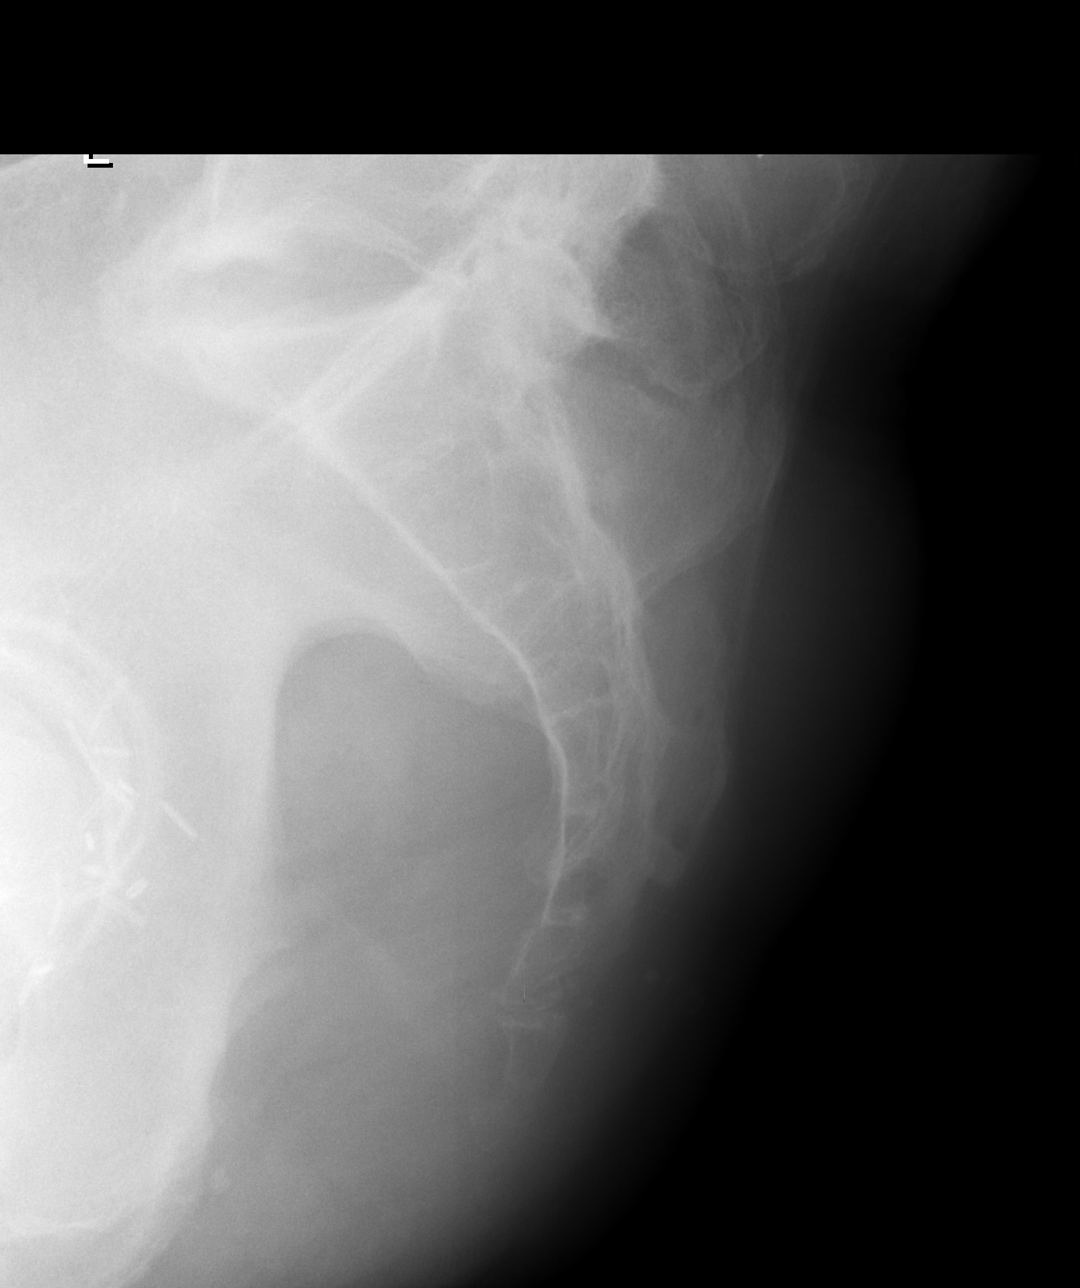

[3 of 3 positions shown; findings below may reference images not displayed]

FINDINGS: No fracture.  No bone lesion.

SI joints are normally aligned.

Bones are diffusely demineralized.

Multiple surgical vascular clips in the low pelvis are consistent
with a previous prostatectomy.
IMPRESSION: No fracture or bone lesion.  No acute finding.

## 2018-03-30 IMAGING — CR DG LUMBAR SPINE COMPLETE 4+V
6 series · 6 of 6 positions shown · non-contrast
Comparison: 04/11/2015

CLINICAL DATA: Patient fell 2 days ago. Pain in the right low back
and right hip and coccyx region. History of lumbar back surgery.

EXAM:
LUMBAR SPINE - COMPLETE 4+ VIEW

[t l-spine a.p. (1 of 2)]
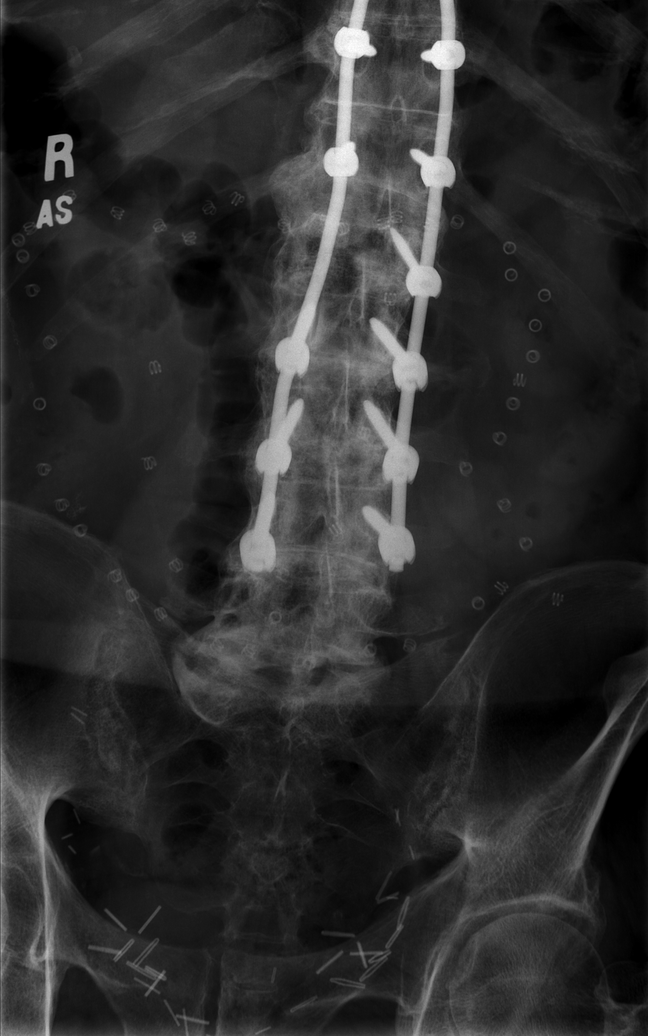

[t l-spine a.p. (2 of 2)]
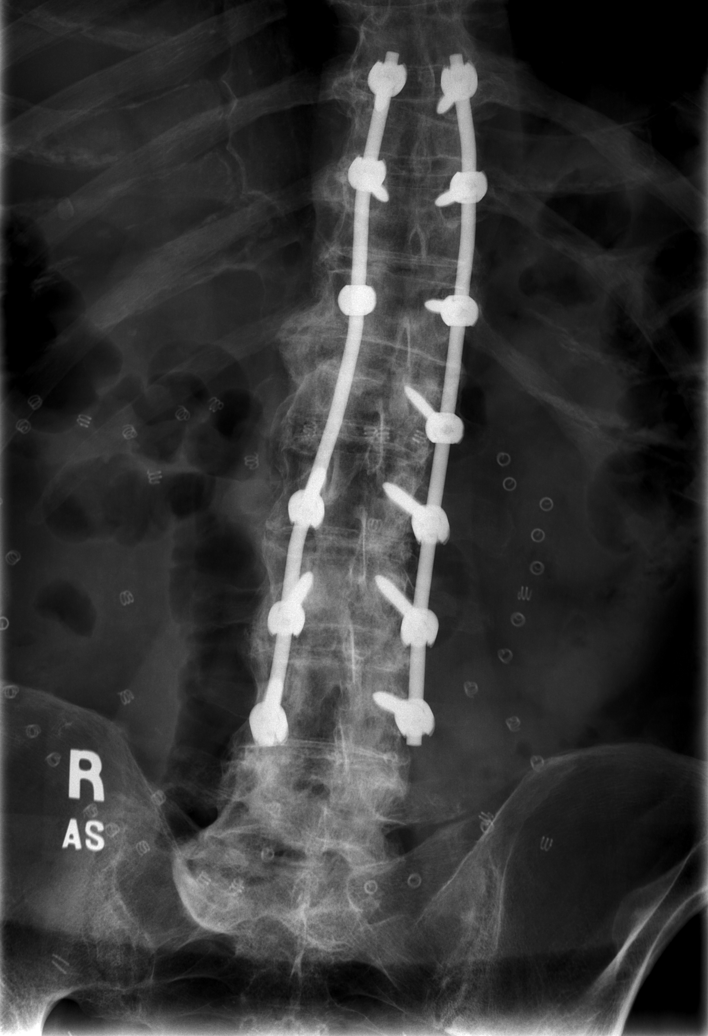

[t l-spine oblique exposure (1 of 2)]
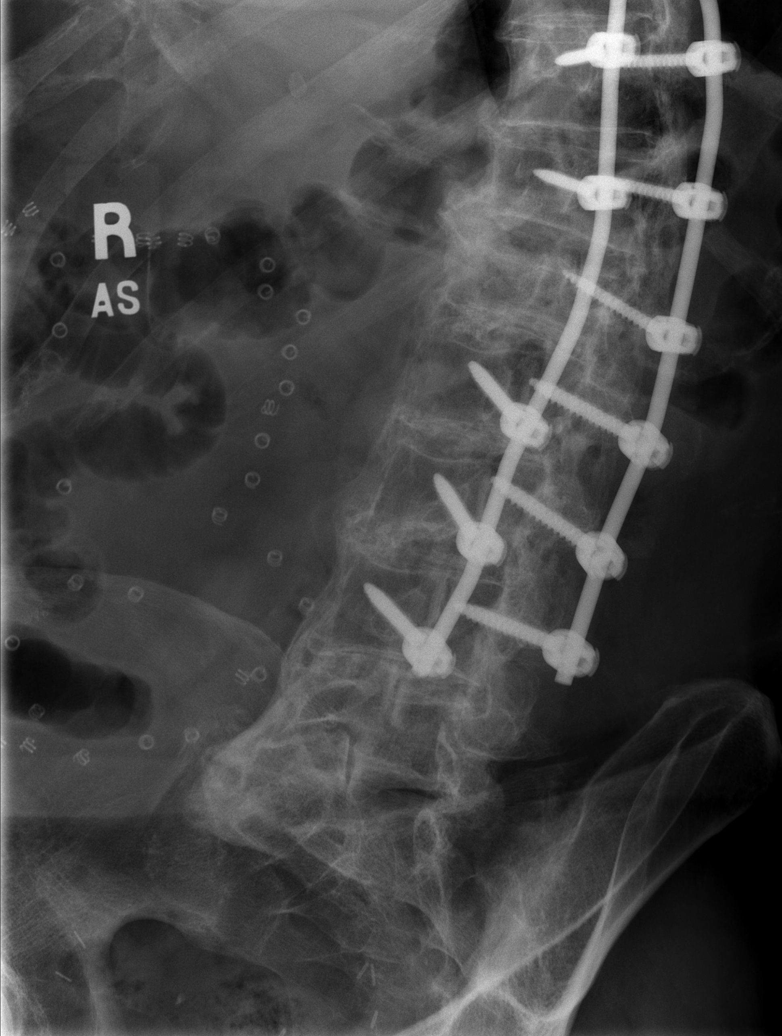

[t l-spine oblique exposure (2 of 2)]
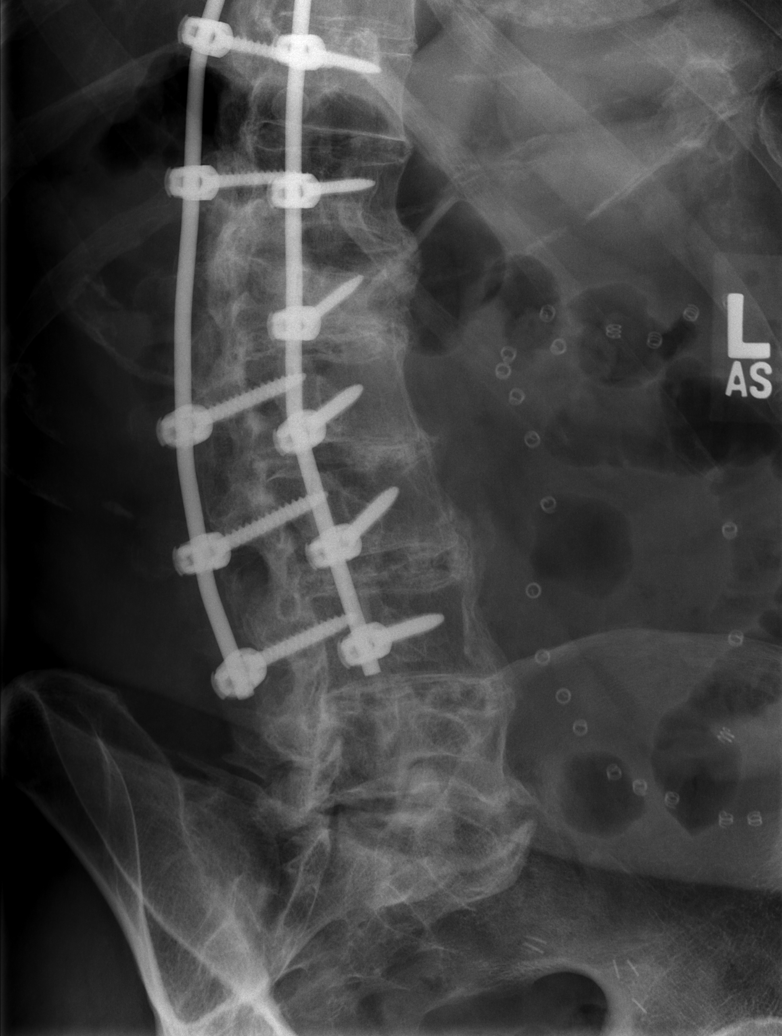

[t l-spine lat]
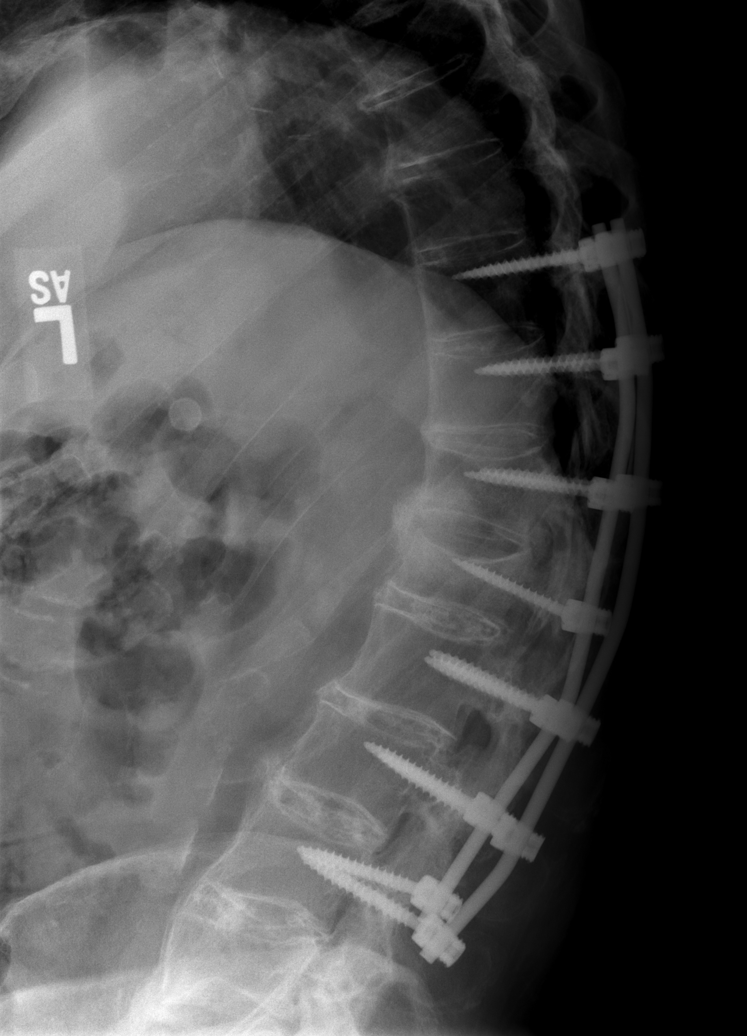

[t l-spine l5-s1 spot]
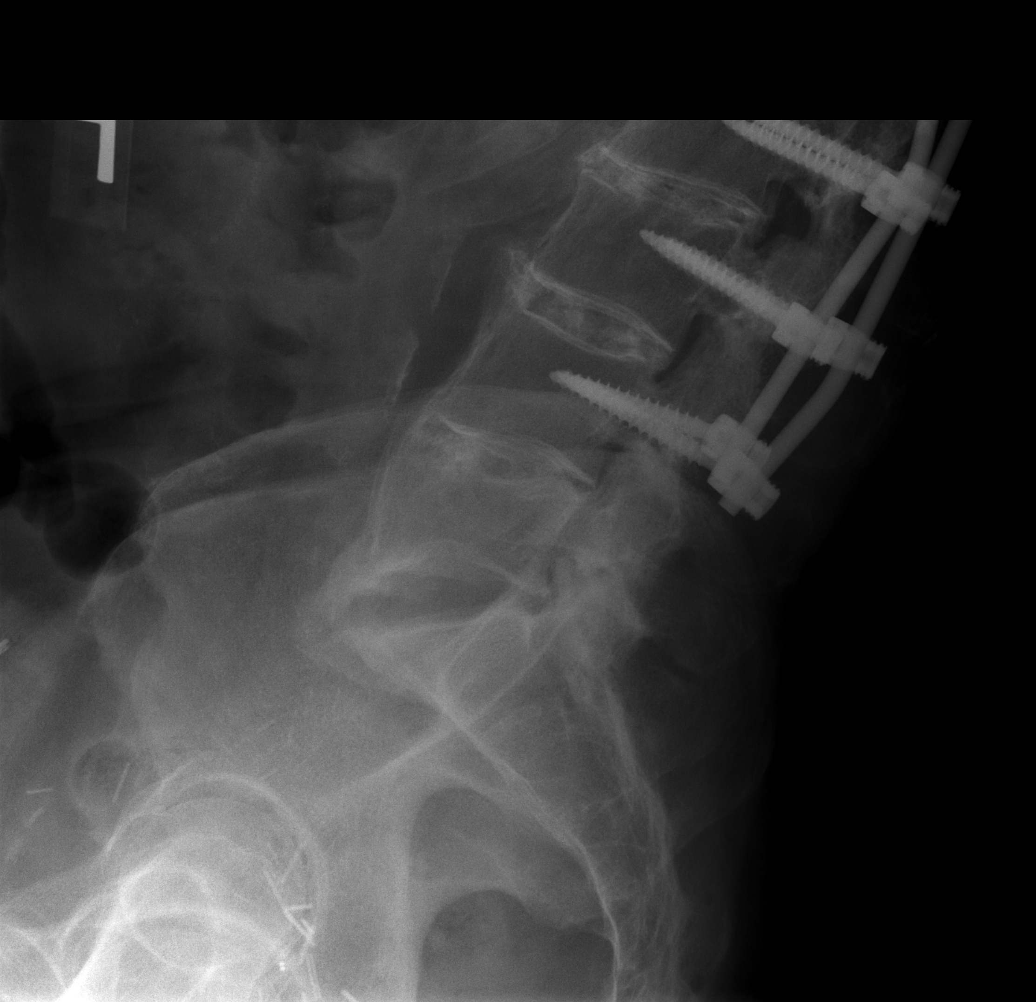

[6 of 6 positions shown; findings below may reference images not displayed]

FINDINGS: No acute fracture.  No spondylolisthesis.

Posterior fusion has been performed from T10 through L4 with
bilateral pedicle screws interconnecting rods. The orthopedic
hardware is well-seated, without evidence of loosening, and
unchanged. There is a chronic mild compression fracture of T12 a
chronic mild to moderate compression fracture of L1.

Moderate loss of disc height is noted from L1-L2 through L4-L5,
stable.

Bones are diffusely demineralized.

Soft tissues demonstrate stable anterior hernia mesh but are
otherwise unremarkable.
IMPRESSION: 1. No acute fracture
2. Well-positioned orthopedic hardware fusing T10 through L4. No
evidence of orthopedic hardware loosening.
3. No spondylolisthesis.

## 2018-03-30 IMAGING — CR DG HIP (WITH OR WITHOUT PELVIS) 2-3V*R*
2 series · 2 of 2 positions shown · non-contrast
Comparison: None.

CLINICAL DATA: Patient fell 2 days ago. Pain in the right low back
and right hip and coccyx region. History of lumbar back surgery.

EXAM:
DG HIP (WITH OR WITHOUT PELVIS) 2-3V RIGHT

[w pelvis]
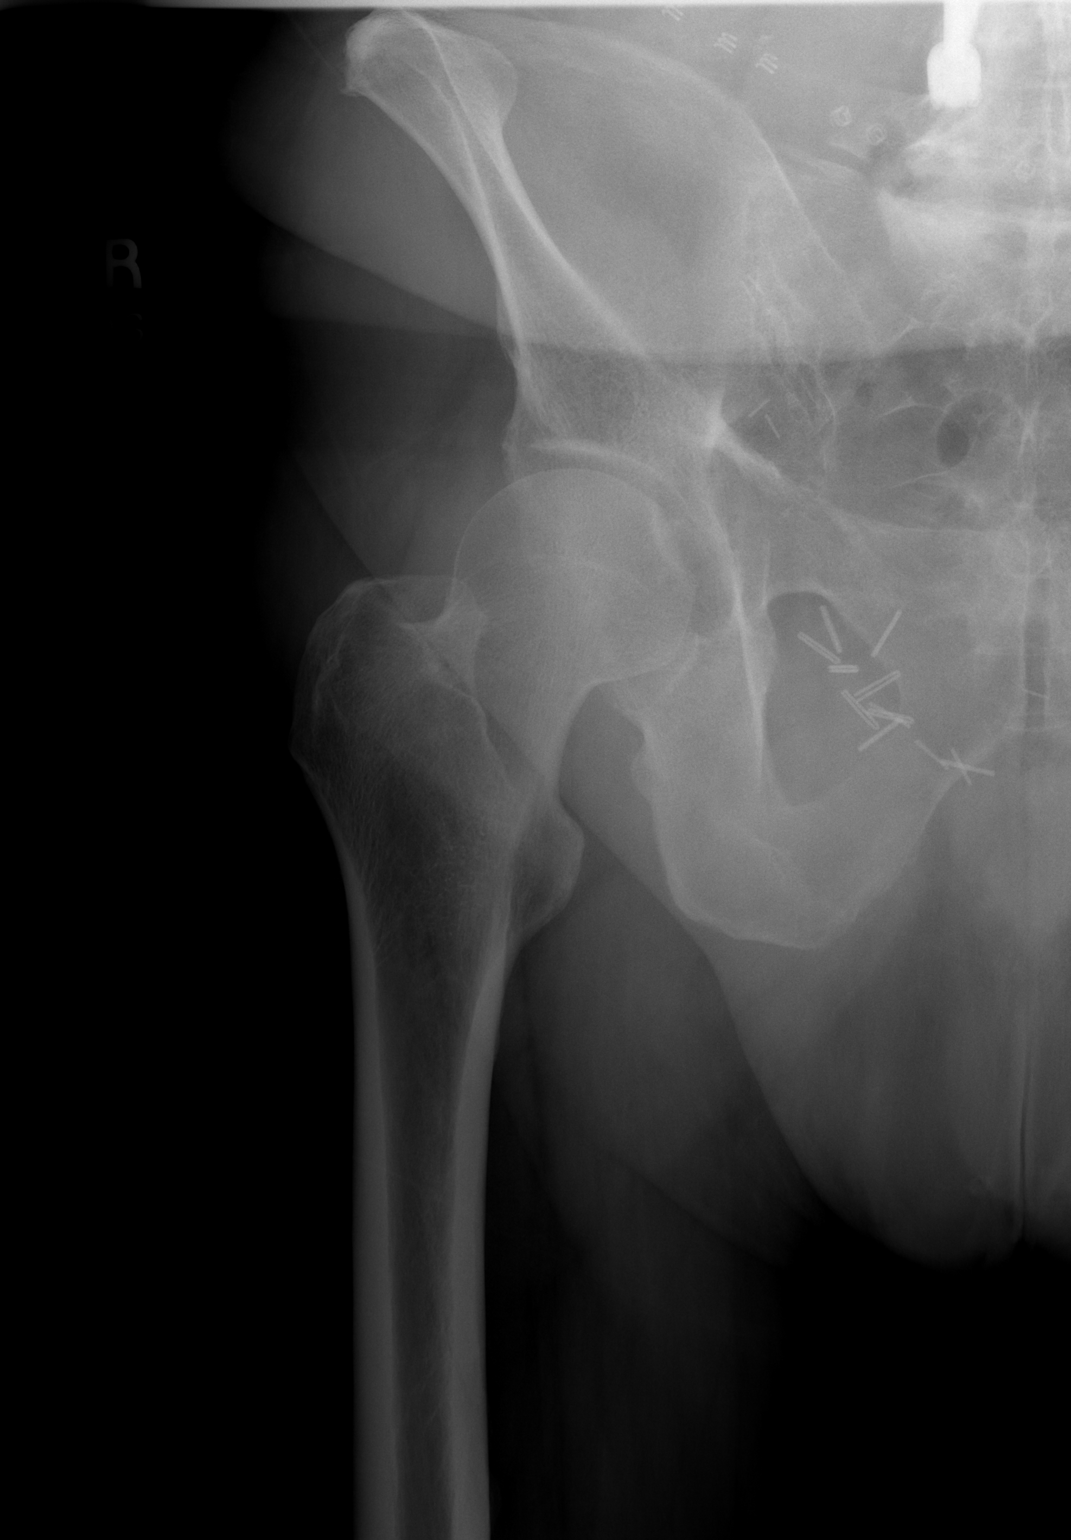

[t hip frog leg right]
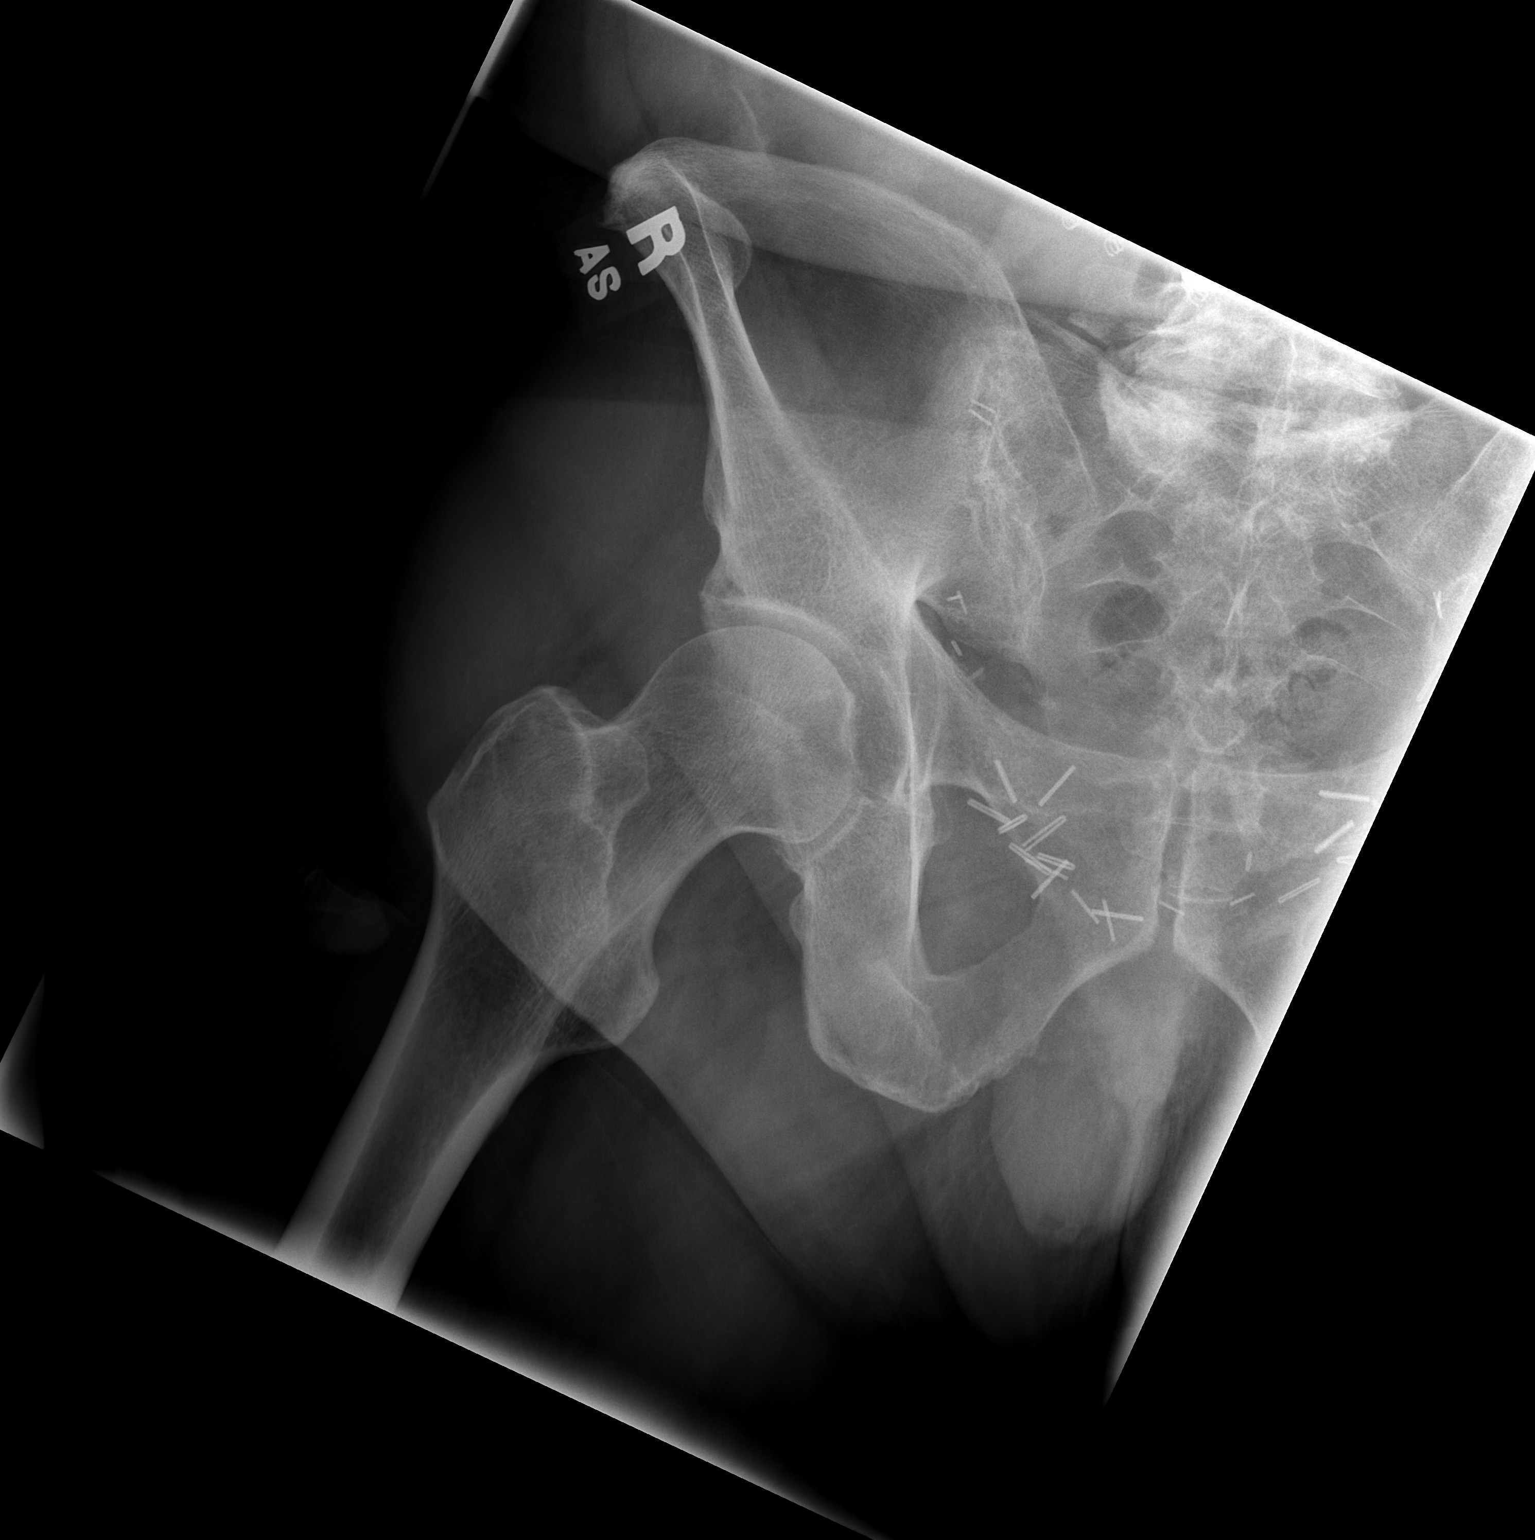

[2 of 2 positions shown; findings below may reference images not displayed]

FINDINGS: No fracture.  No bone lesion.

Joint is normally spaced and aligned.  No arthropathic change.

Multiple surgical vascular clips are noted in the low pelvis
consistent with previous prostatectomy.
IMPRESSION: 1. No fracture, bone lesion or hip joint abnormality.

## 2018-04-04 IMAGING — CT CT ABD-PELV W/O CM
2 of 4 series · 15 of 46 positions shown, 17 images · non-contrast
Comparison: Chest CT 01/10/2016, abdominal ultrasound earlier this
day

CLINICAL DATA: Upper abdominal pain. Low back pain. Injury, fall
last week. Nausea.

EXAM:
CT ABDOMEN AND PELVIS WITHOUT CONTRAST
TECHNIQUE: Multidetector CT imaging of the abdomen and pelvis was performed
following the standard protocol without IV contrast.

[Series 3: a/p w/o 5mm · axial · non-contrast · 0.91mm/px · z∈[+638,+1118]mm · 12 of 106 slices shown, 14 images]
[im 5/106  soft-tissue]
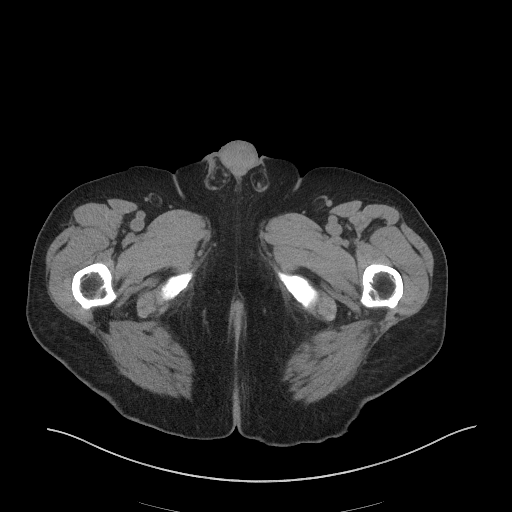
[im 5/106  bone]
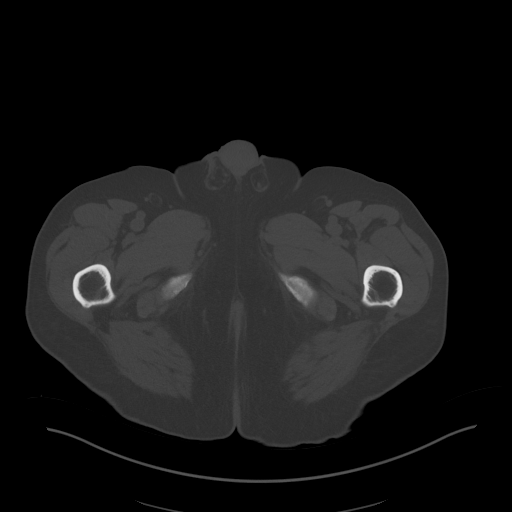
[im 14/106  soft-tissue]
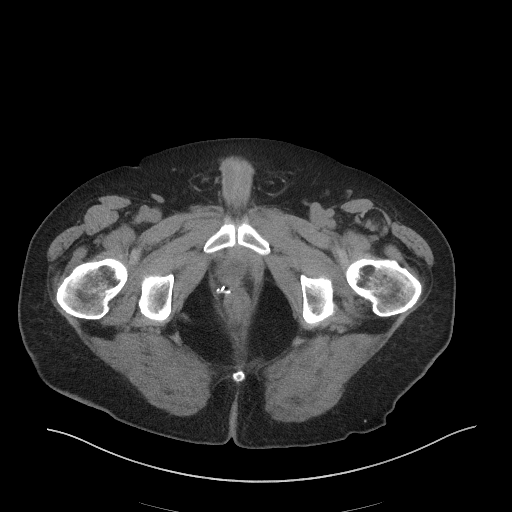
[im 23/106  soft-tissue]
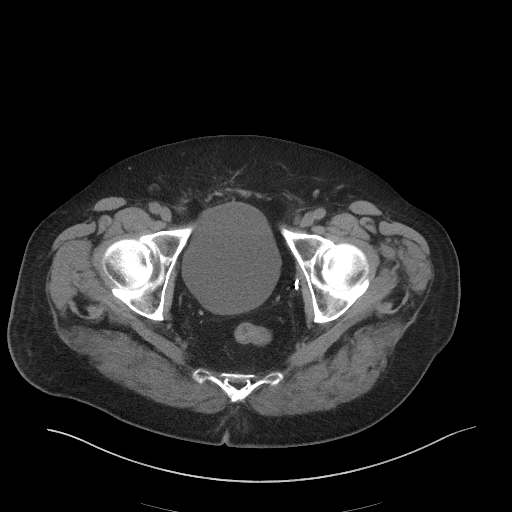
[im 32/106  soft-tissue]
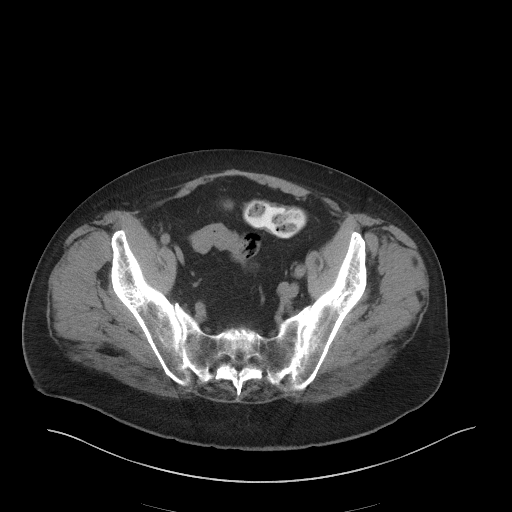
[im 42/106  soft-tissue]
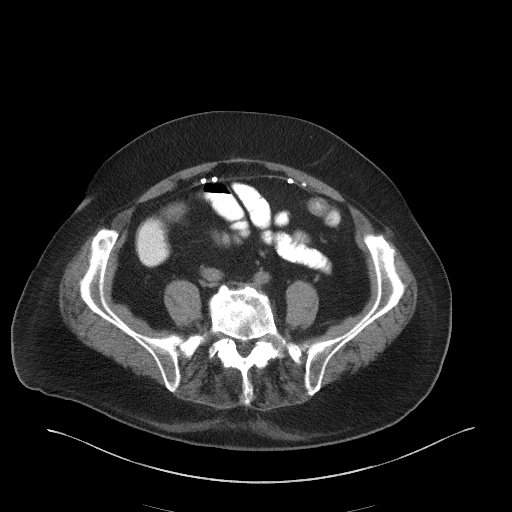
[im 51/106  soft-tissue]
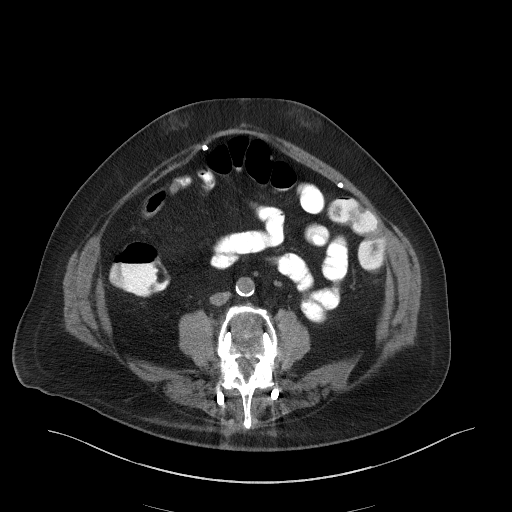
[im 55/106  soft-tissue]
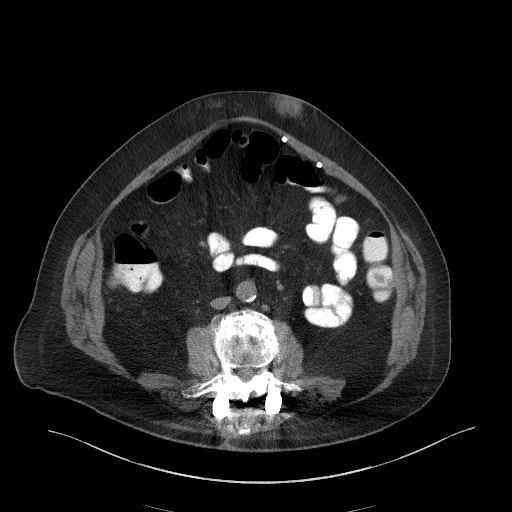
[im 64/106  soft-tissue]
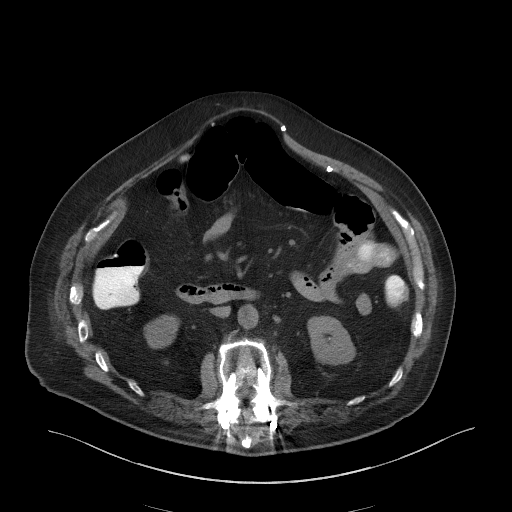
[im 74/106  soft-tissue]
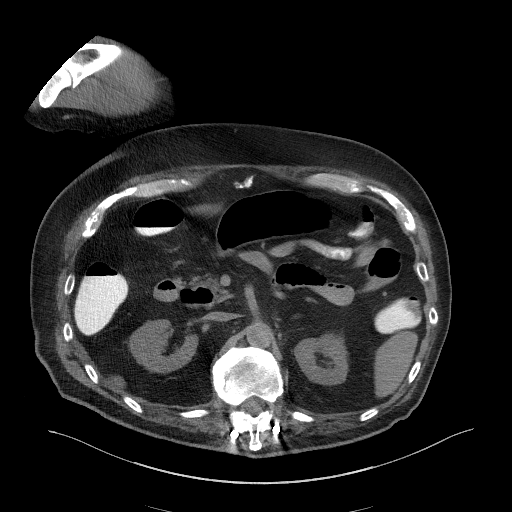
[im 74/106  bone]
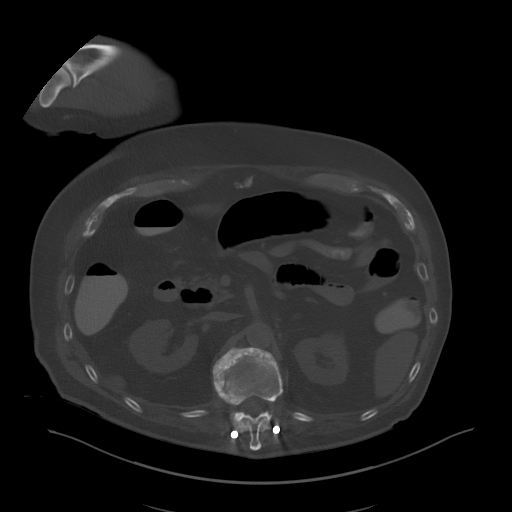
[im 83/106  soft-tissue]
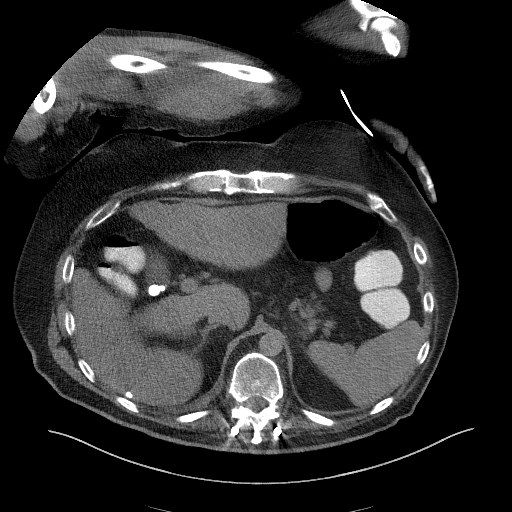
[im 92/106  soft-tissue]
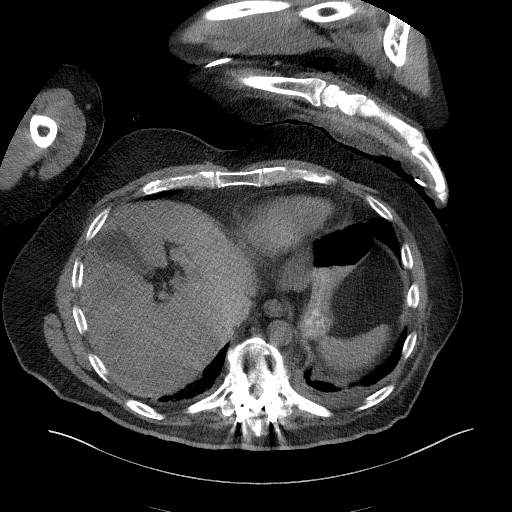
[im 101/106  soft-tissue]
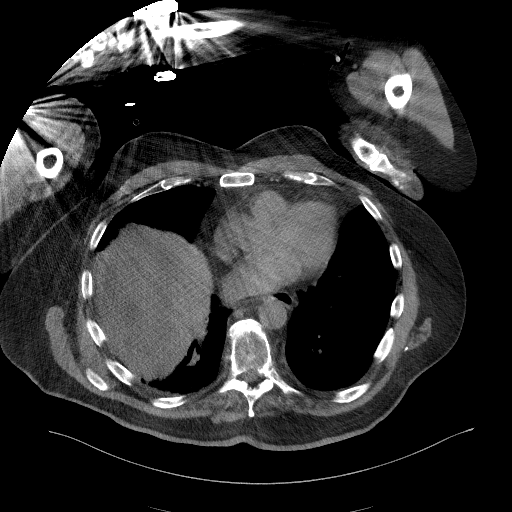

[Series 6: a/p w/o cor · coronal · non-contrast · 0.89mm/px · 3 of 159 slices shown]
[im 53/159  soft-tissue]
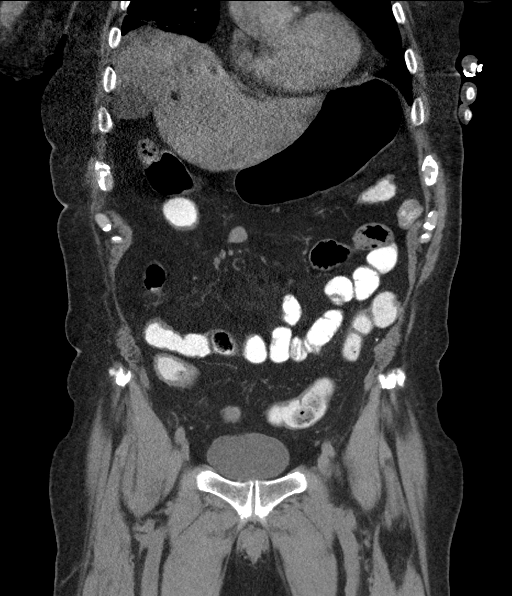
[im 71/159  soft-tissue]
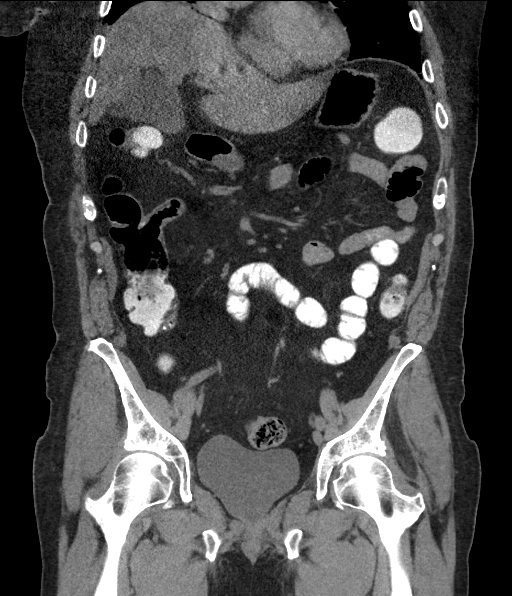
[im 88/159  soft-tissue]
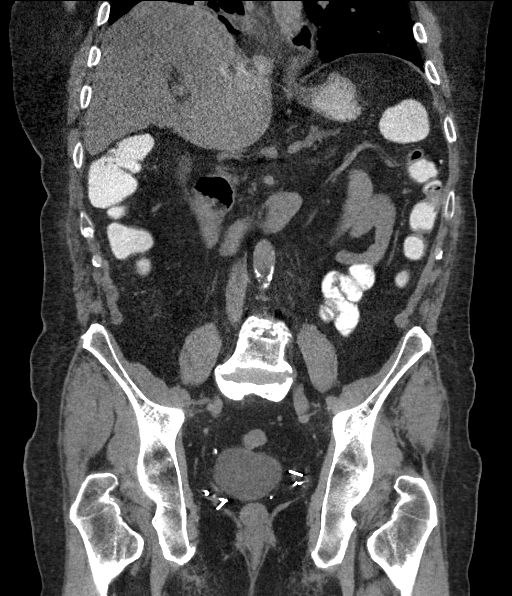

[15 of 46 positions shown; findings below may reference images not displayed]

FINDINGS: Lower chest: Chronic elevation of right hemidiaphragm with adjacent
volume loss in the right lower lobe. There is are small bilateral
pleural effusions that have developed from prior chest CT.

Liver: No evidence of traumatic injury or focal lesion allowing for
lack contrast. There streak artifact from adjacent arm positioning.

Hepatobiliary: Calcified gallstone without pericholecystic
inflammation. No biliary dilatation.

Pancreas: Atrophic parenchyma. No evidence of injury. No ductal
dilatation or inflammation.

Spleen: Homogeneous attenuation without focal abnormality. No
evidence of injury.

Adrenal glands: No nodule or hemorrhage.

Kidneys: Nonobstructing 4mm stones in the upper and lower right
kidney. No hydronephrosis or perinephric fluid. The findings to
suggest renal injury. Punctate nonobstructing stone in the lower
left kidney.

Stomach/Bowel: Stomach physiologically distended. There are no
dilated or thickened small bowel loops. No bowel obstruction,
enteric contrast reaches the colon. Small volume of stool throughout
the colon without colonic wall thickening. There is sigmoid colonic
tortuosity. The appendix is not identified, no pericecal
inflammation.

Vascular/Lymphatic: No retroperitoneal fluid. Abdominal aorta is
normal in caliber. Mild atherosclerosis without aneurysm. No
retroperitoneal, mesenteric, or pelvic adenopathy. Post pelvic lymph
node dissection.

Reproductive: Post prostatectomy with surgical clips.

Bladder: Physiologically distended, no wall thickening.

Other: Post ventral abdominal wall hernia repair with multiple
tacks. Soft tissue density in the subcutaneous anterior abdominal
wall midline, favor secondary to subcutaneous injections. No free
air, free fluid, or intra-abdominal fluid collection.

Musculoskeletal: There is an acute fracture through T9 vertebral
body, oblique in orientation with mild distraction and displacement.
Fracture extends through the posterior cortex were there is minimal
retropulsion. No definite pedicle or posterior element involvement.
Posterior thoracolumbar fusion T10 through L4. Compression deformity
of L1 is remote. No acute fracture of the bony pelvis.
IMPRESSION: 1. Acute T9 vertebral body fracture with mild distraction and
extension to the posterior cortex, minimal retropulsion. Recommend
surgical evaluation. Posterior fusion T10 through L4 with a remote
L1 compression fracture.
2. No additional acute injury or acute traumatic abnormality in the
abdomen/pelvis.
3. Small bilateral pleural effusions, new from chest CT earlier this
month.
4. Chronic and incidental findings include cholelithiasis and
bilateral nonobstructing nephrolithiasis.
These results were called by telephone at the time of interpretation
on 01/31/2016 at [DATE] to PA Isabelle Daily , who verbally
acknowledged these results.

## 2018-04-12 IMAGING — RF DG THORACIC SPINE 2V
1 series · 3 of 3 positions shown · non-contrast
Comparison: Lumbar spine films of 01/26/2016

CLINICAL DATA: Extension of fusion from T7-T11

EXAM:
THORACIC SPINE 2 VIEWS

[Series 1: run · 3 of 3 slices shown]
[im 1/3]
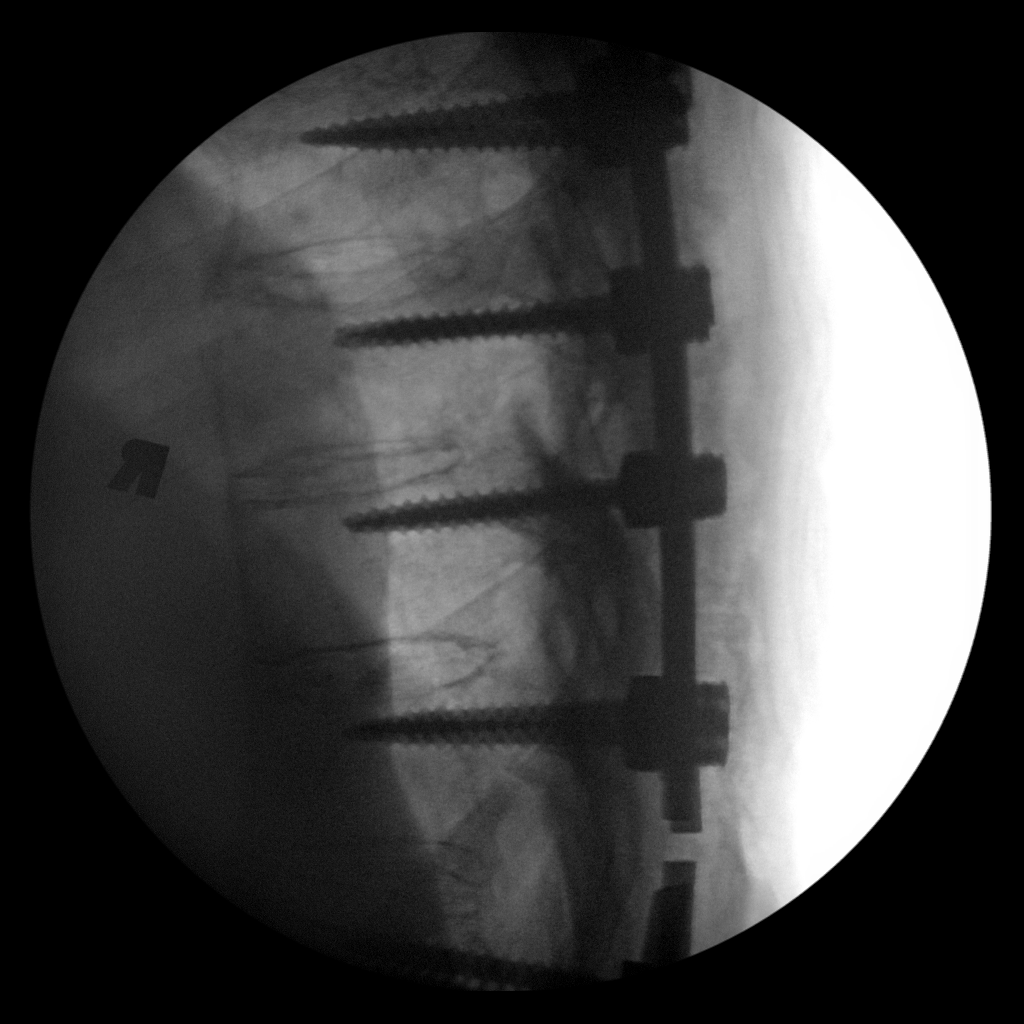
[im 2/3]
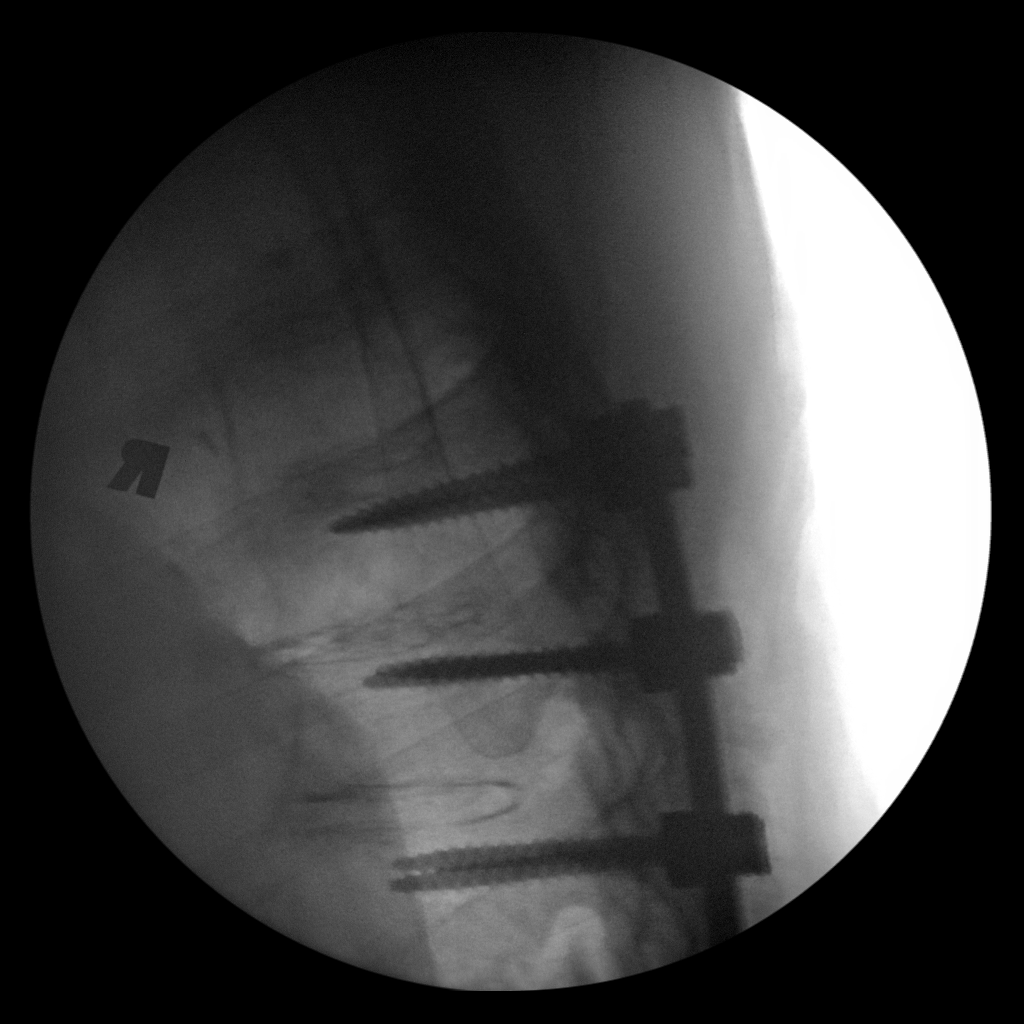
[im 3/3]
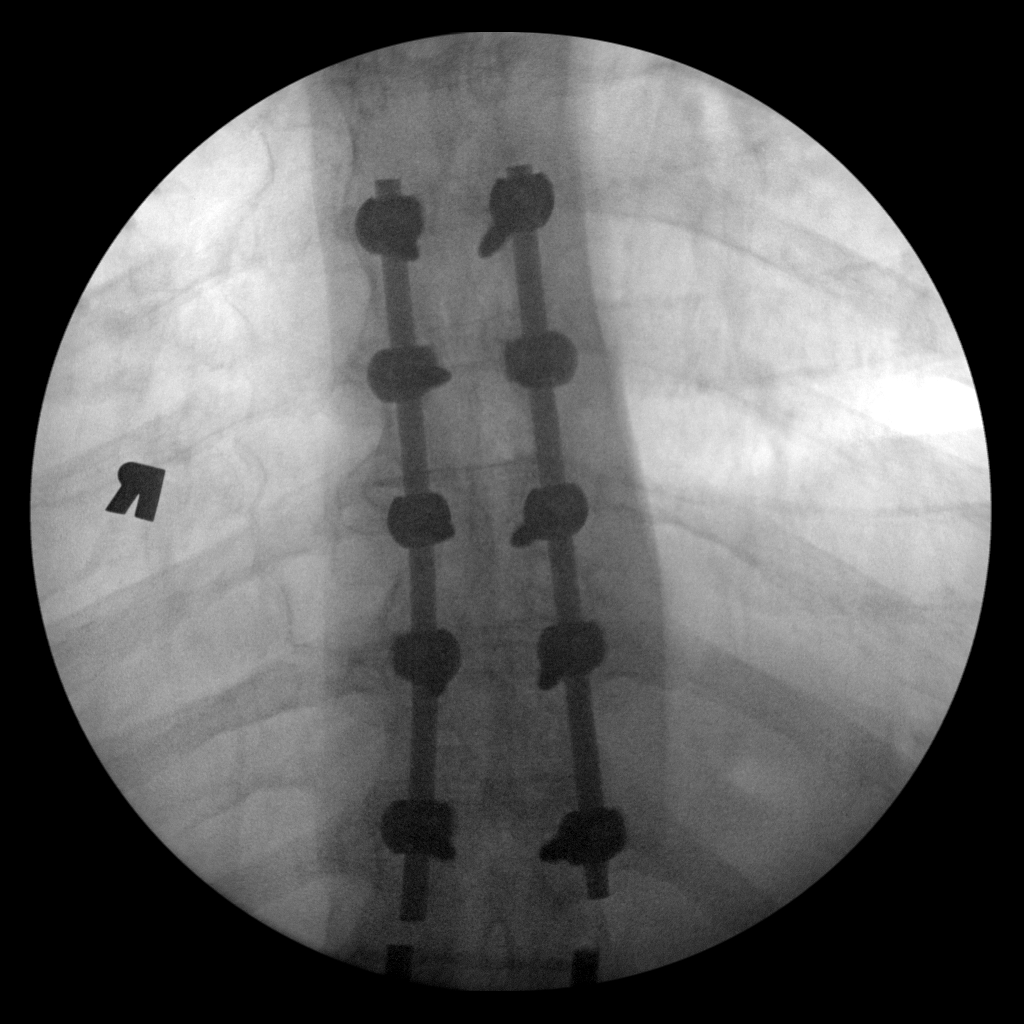

[3 of 3 positions shown; findings below may reference images not displayed]

FINDINGS: Three C-arm spot films were returned. By history the extension of
the fusion was from T7-T11.
IMPRESSION: Extension of fusion from T7 and T11. No complicating features on
C-arm spot films which were returned.

## 2018-04-12 IMAGING — CR DG THORACIC SPINE 2V
2 series · 2 of 2 positions shown · non-contrast
Comparison: CT chest 01/10/2016.  CT abdomen pelvis 01/31/2016

CLINICAL DATA: Thoracic compression fracture, close. Preop
evaluation

EXAM:
THORACIC SPINE 2 VIEWS

[w thoracic spine lat]
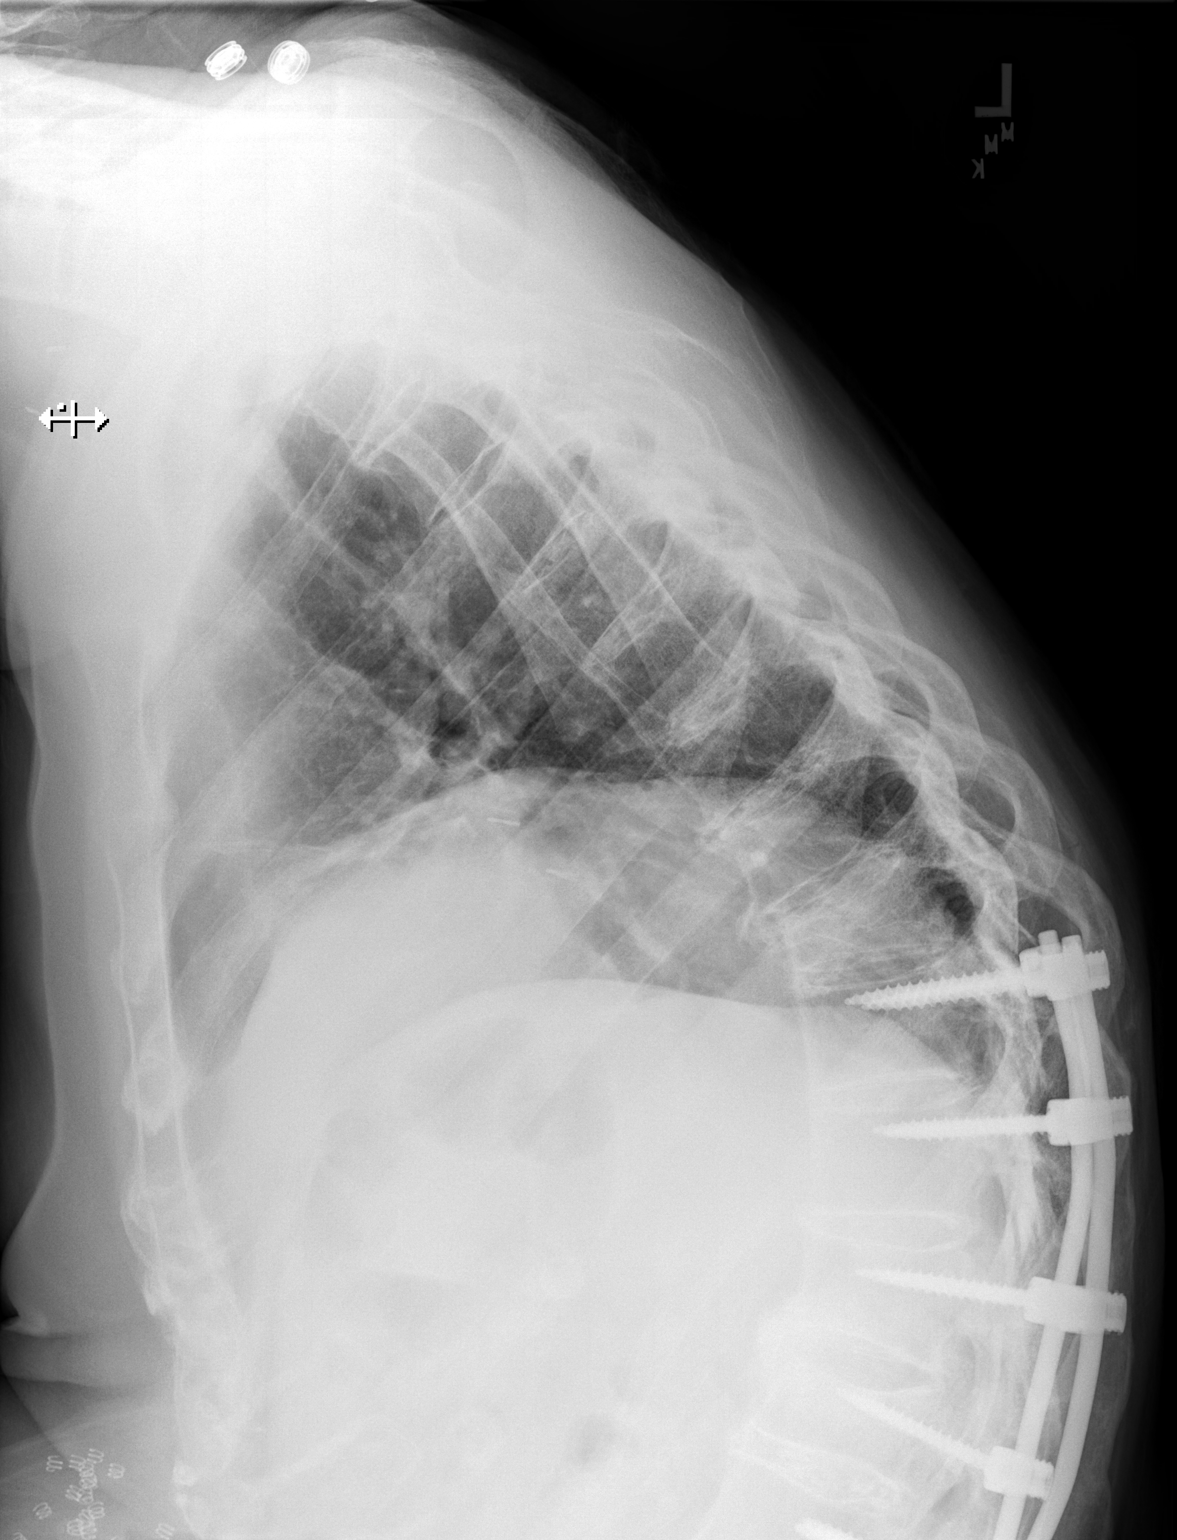

[w thoracic spine ap]
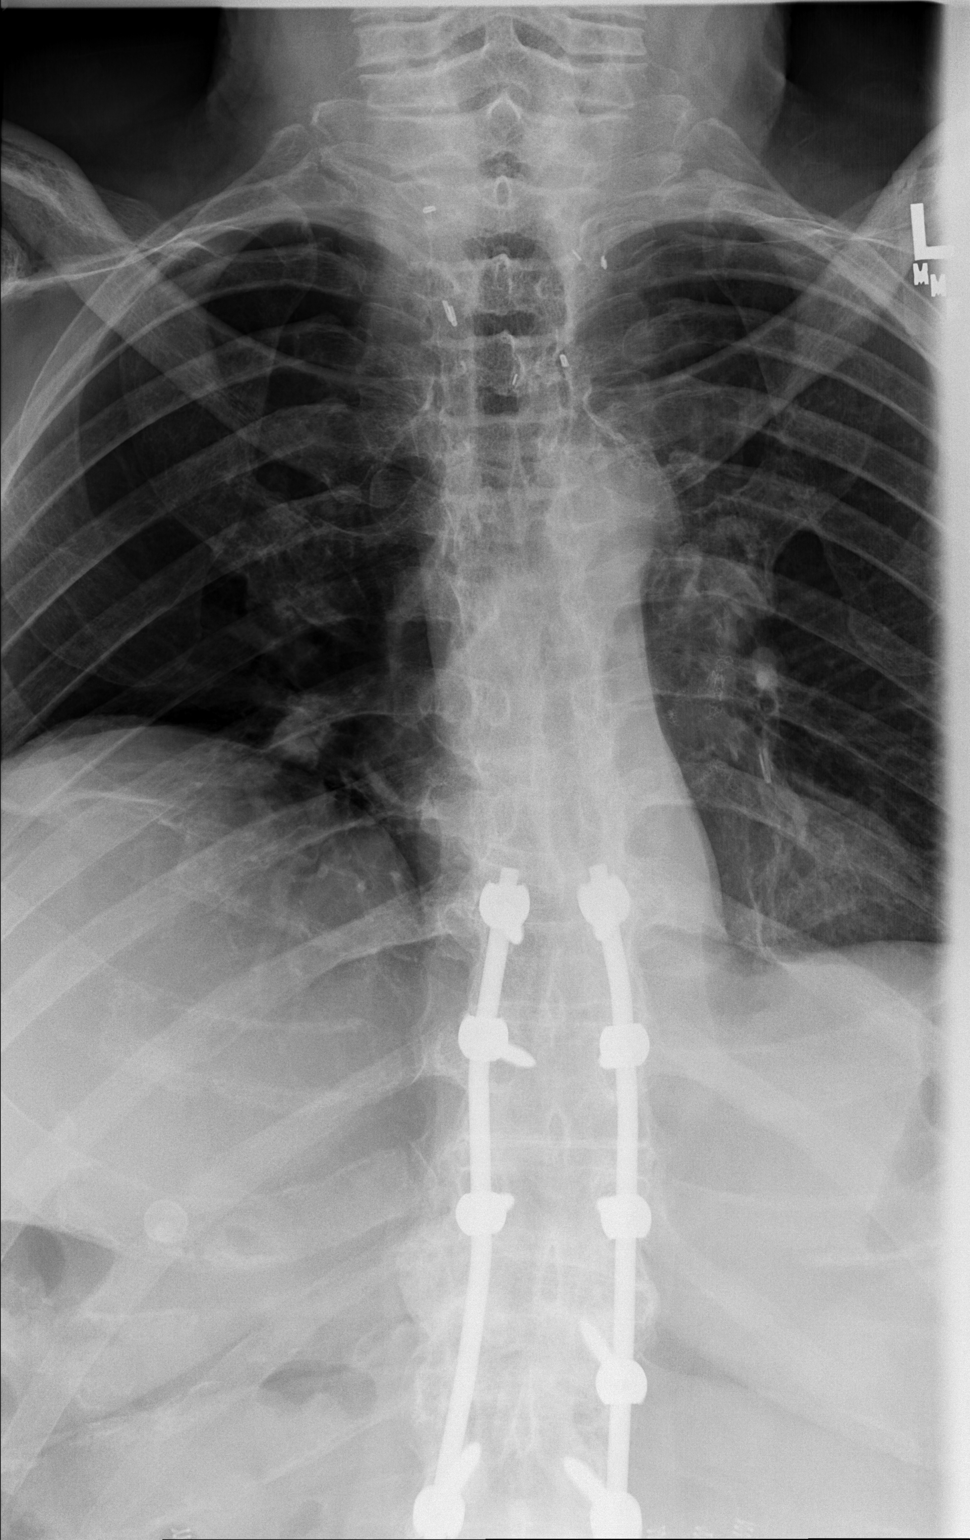

[2 of 2 positions shown; findings below may reference images not displayed]

FINDINGS: Pedicle screw and posterior rod fusion T10 through L4. Moderate
burst fracture of T9 above the fusion level. This involves the
posterior vertebral body cortex on CT. There is further loss of
height since the CT of 01/31/2016. Mild retropulsion of bone into
the canal, not well evaluated by this modality. No other fracture.
IMPRESSION: Moderate burst fracture of T9 with progression of loss of height
since 01/31/2016

Pedicle screw and rod fusion T10 through L4.

## 2018-04-12 IMAGING — CR DG CHEST 2V
2 series · 2 of 2 positions shown · non-contrast
Comparison: Chest radiograph June 14, 2015; chest CT January 10, 2016

CLINICAL DATA: Elevated white blood cell count. Preoperative
evaluation for lumbar surgery

EXAM:
CHEST  2 VIEW

[w chest pa]
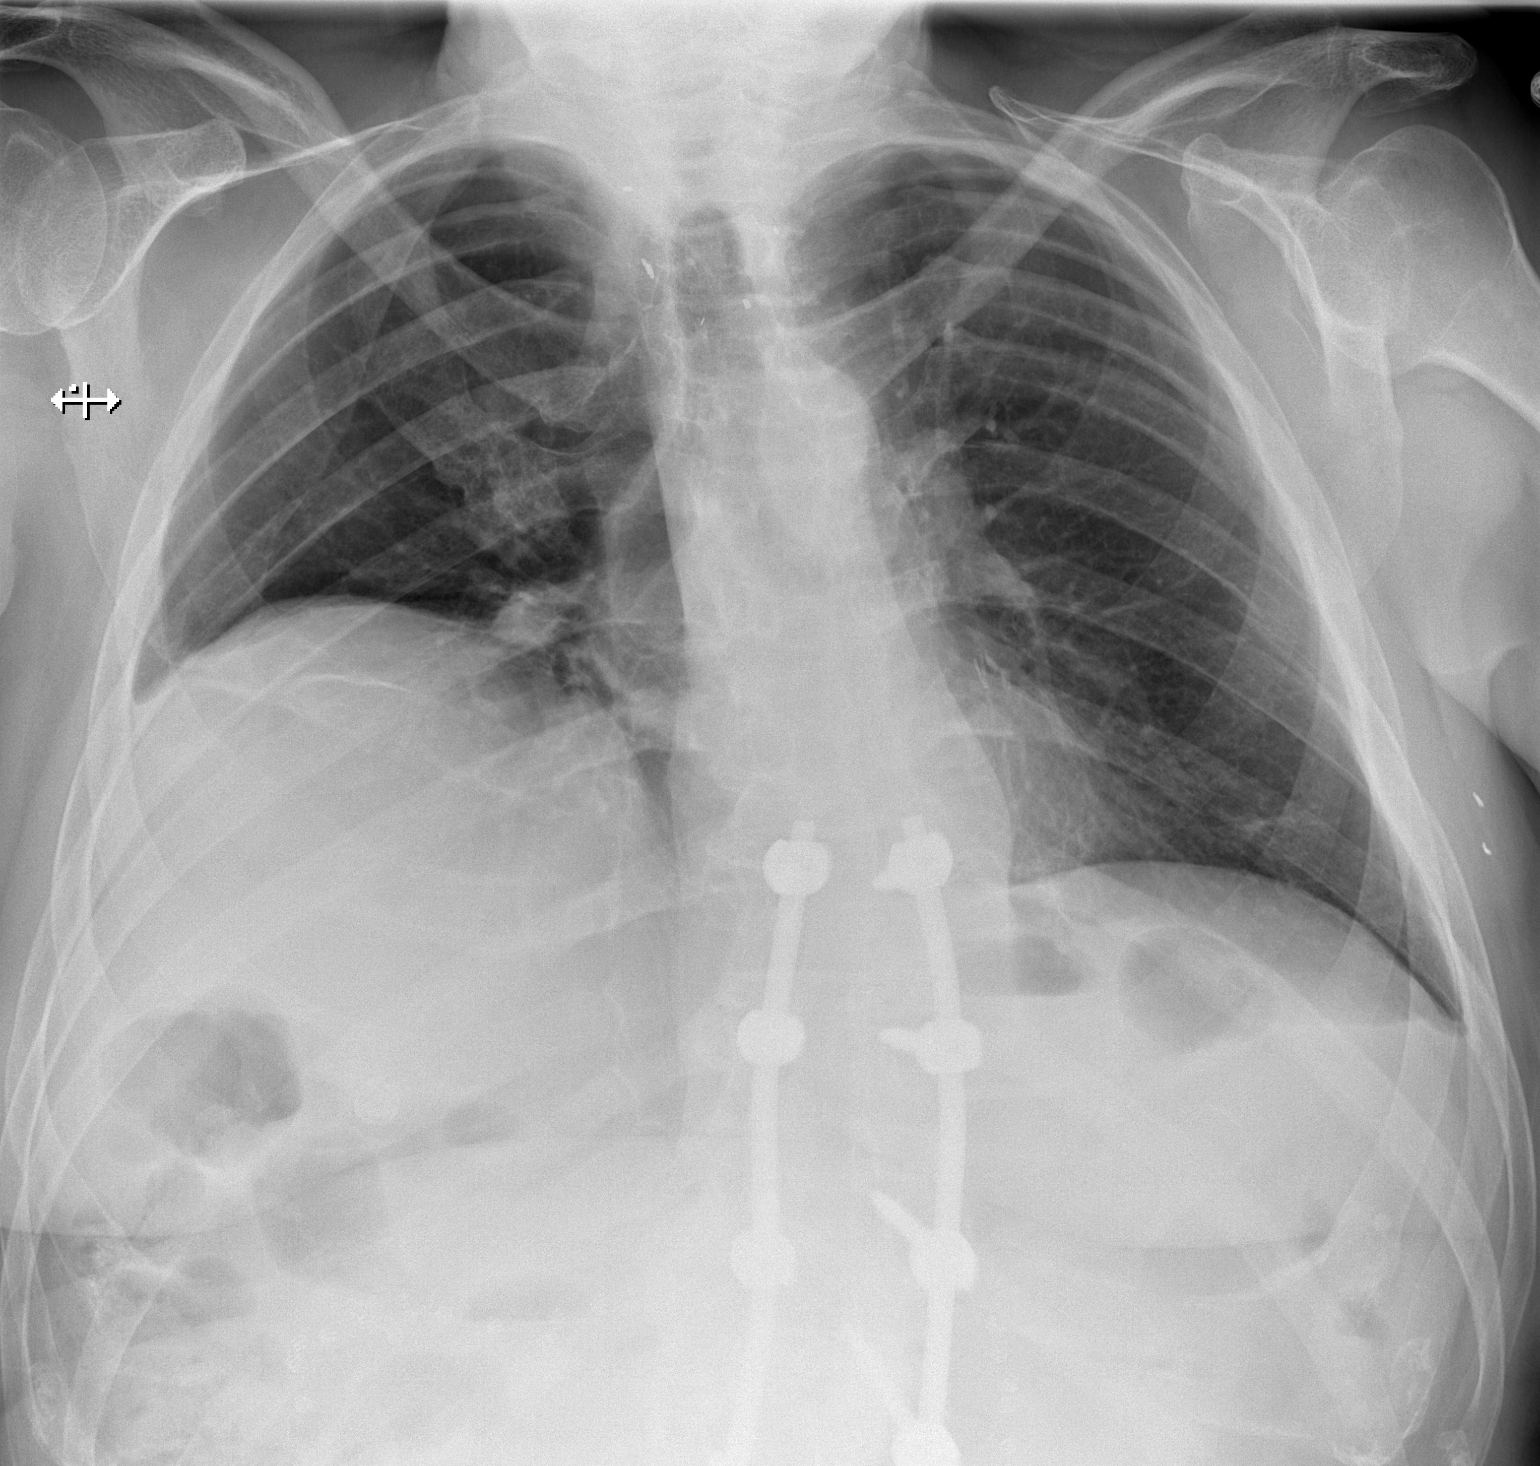

[w chest lat]
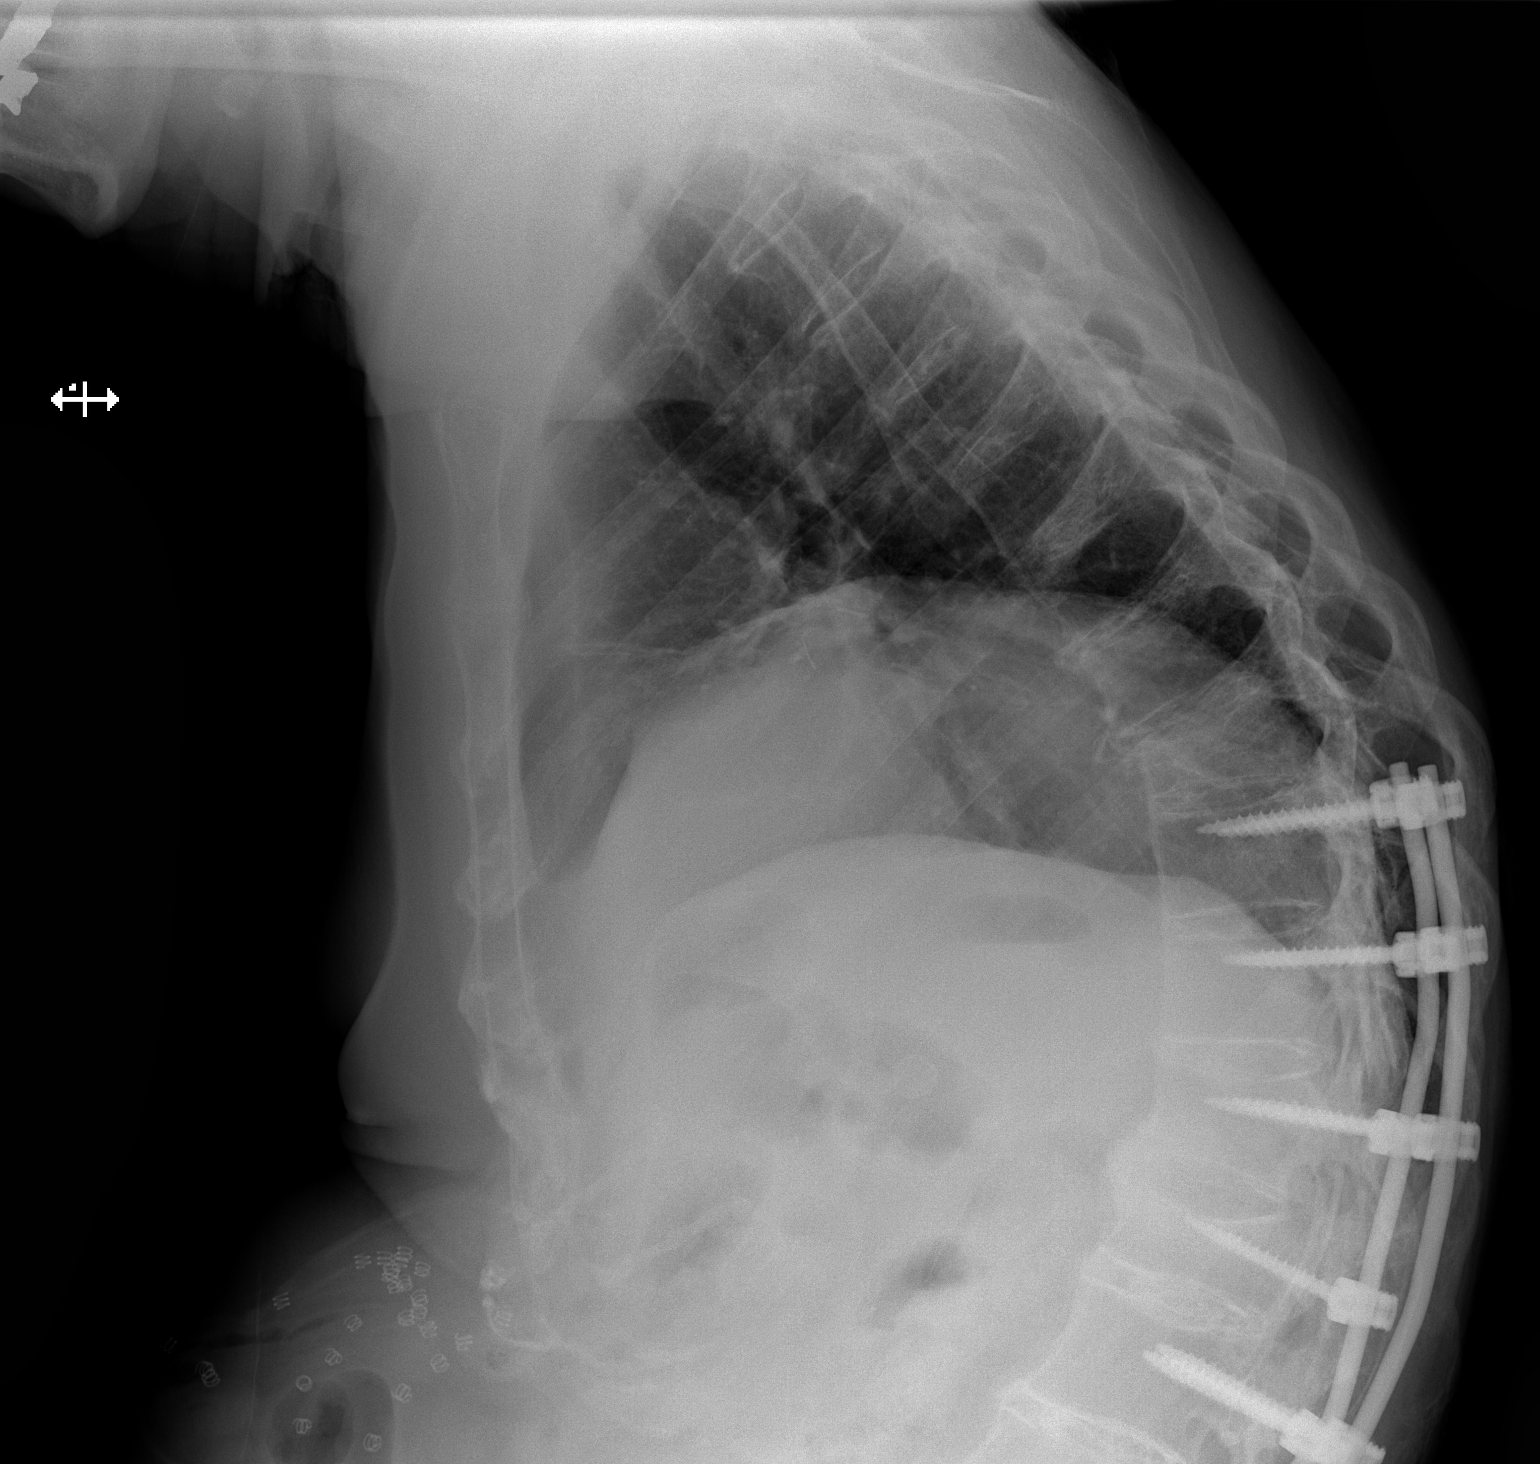

[2 of 2 positions shown; findings below may reference images not displayed]

FINDINGS: There is stable elevation of the right hemidiaphragm. There is mild
bibasilar scarring. There is no frank edema or consolidation. No new
opacity evident. Heart size and pulmonary vascularity are within
normal limits. No adenopathy. There is postoperative change in the
lower thoracic and visualized lumbar spine regions. There is also
postop change in the midline upper thoracic region anteriorly. There
is anterior wedging of several mid lower thoracic vertebral bodies
with increased kyphosis, stable. There is lower thoracic
levoscoliosis.
IMPRESSION: Scarring in the lung bases. No edema or consolidation. No new
opacity. Stable cardiac silhouette. Persistent elevation of the
right hemidiaphragm.

## 2018-05-14 DIAGNOSIS — C44229 Squamous cell carcinoma of skin of left ear and external auricular canal: Secondary | ICD-10-CM | POA: Diagnosis not present

## 2018-05-14 DIAGNOSIS — Z85828 Personal history of other malignant neoplasm of skin: Secondary | ICD-10-CM | POA: Diagnosis not present

## 2018-05-17 IMAGING — US US ABDOMEN COMPLETE
1 series · 13 of 25 positions shown · non-contrast
Comparison: Right upper quadrant abdominal ultrasound January 31, 2016

CLINICAL DATA: Generalized abdominal pain for the past 2 months
since falling. Known gallstones.

EXAM:
ABDOMEN ULTRASOUND COMPLETE

[Series 1: us abdomen complete · 0.23mm/px · 13 of 63 slices shown]
[im 1/63]
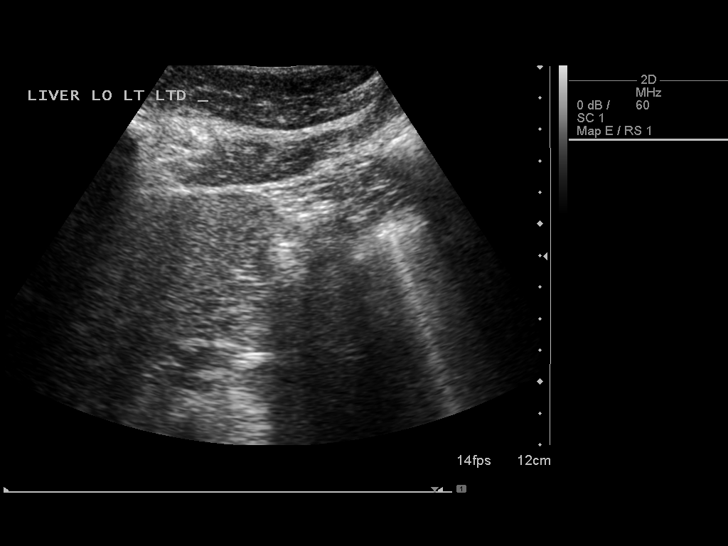
[im 6/63]
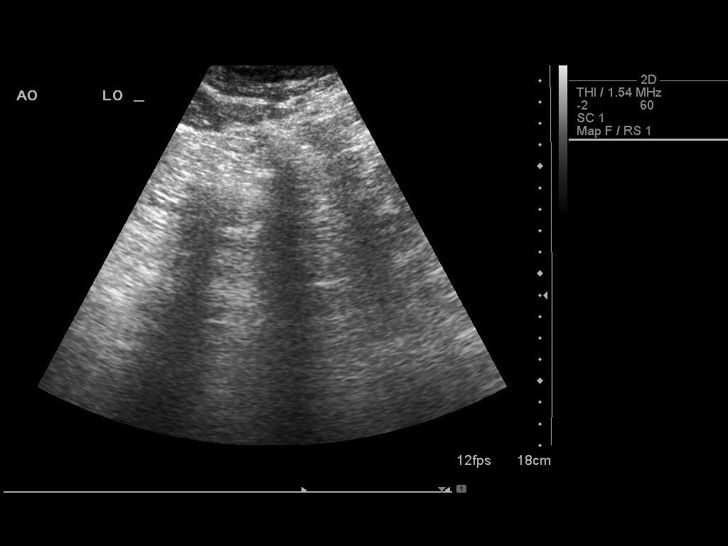
[im 11/63]
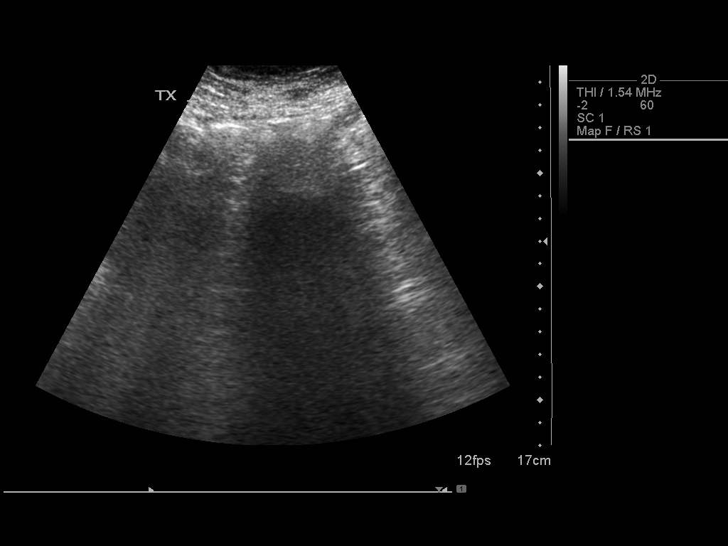
[im 16/63]
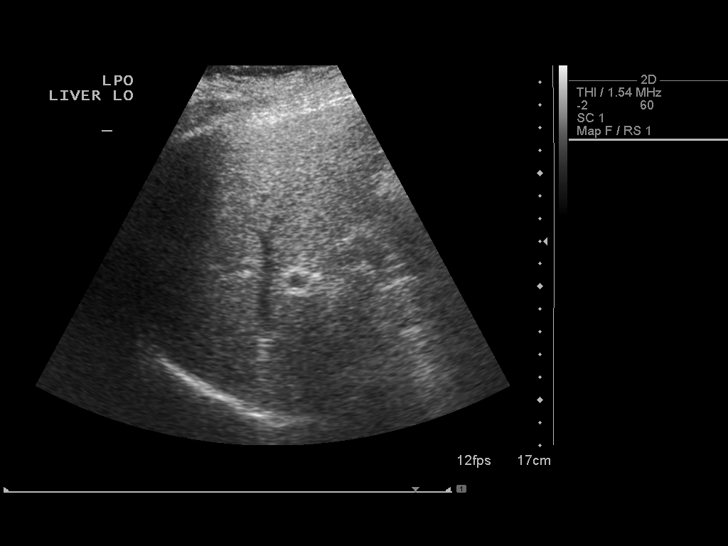
[im 21/63]
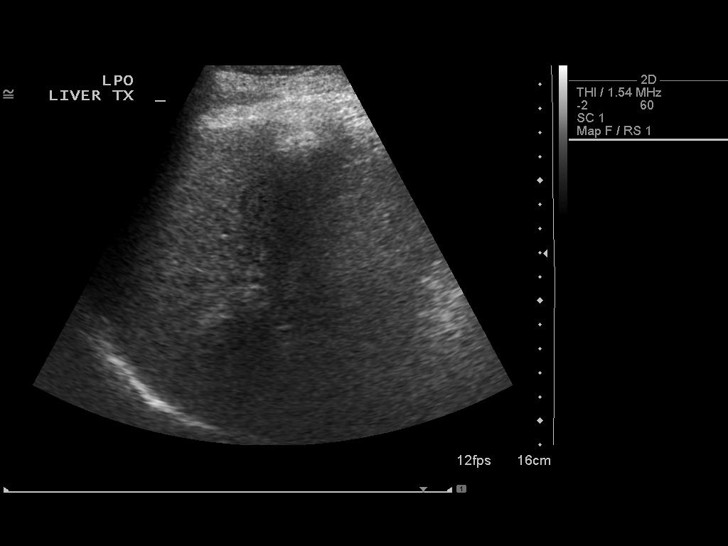
[im 26/63]
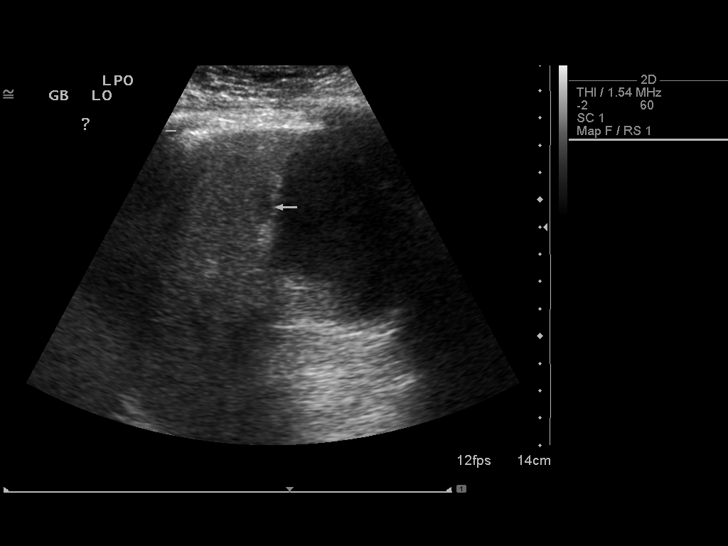
[im 32/63]
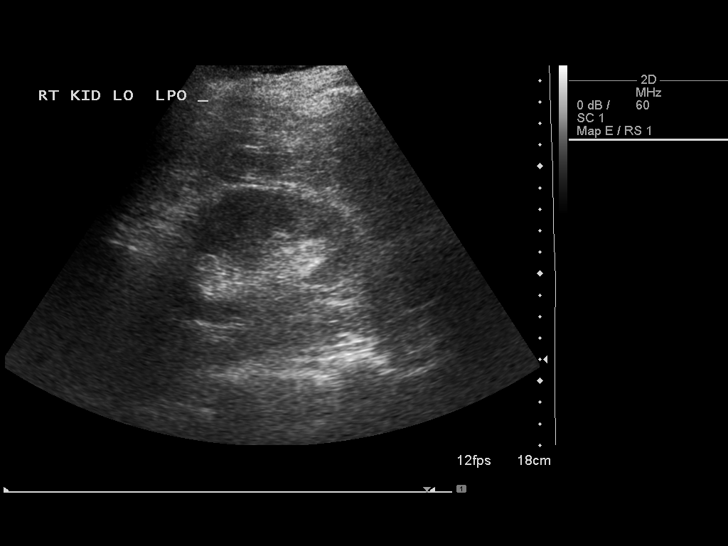
[im 37/63]
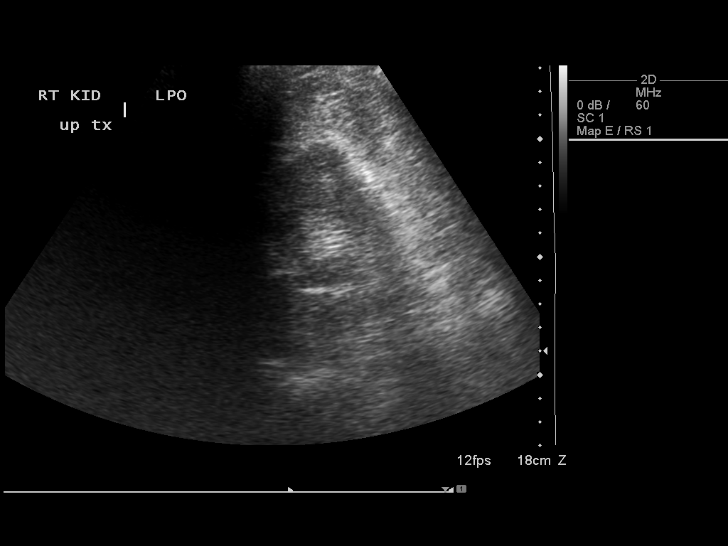
[im 42/63]
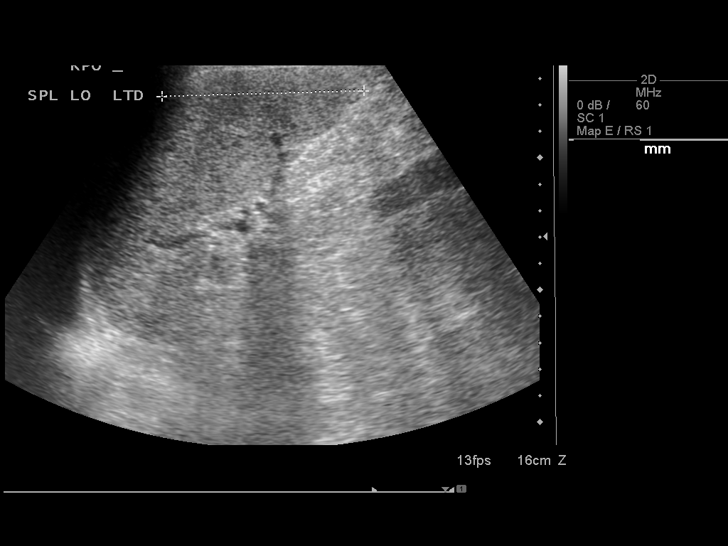
[im 47/63]
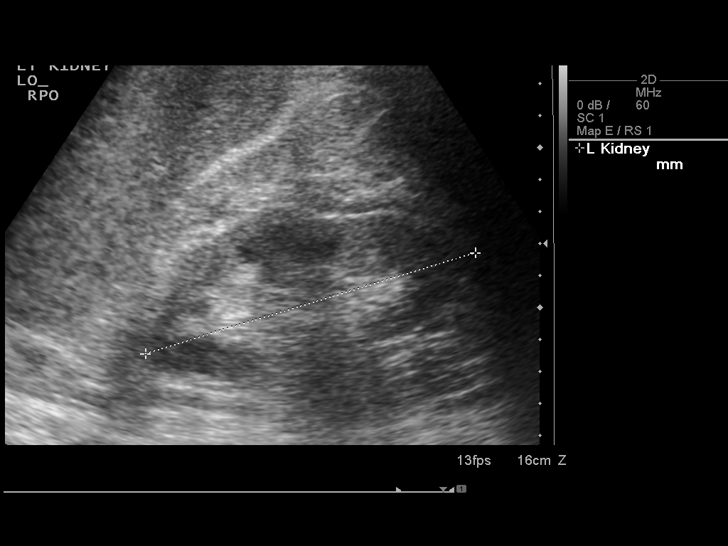
[im 52/63]
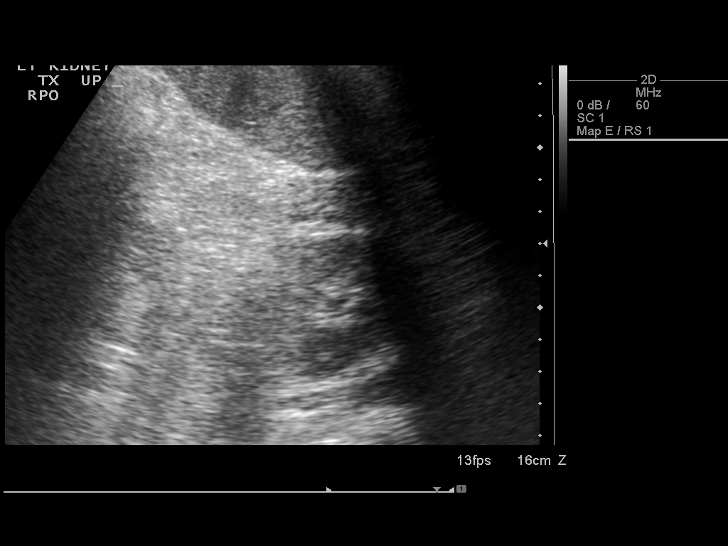
[im 57/63]
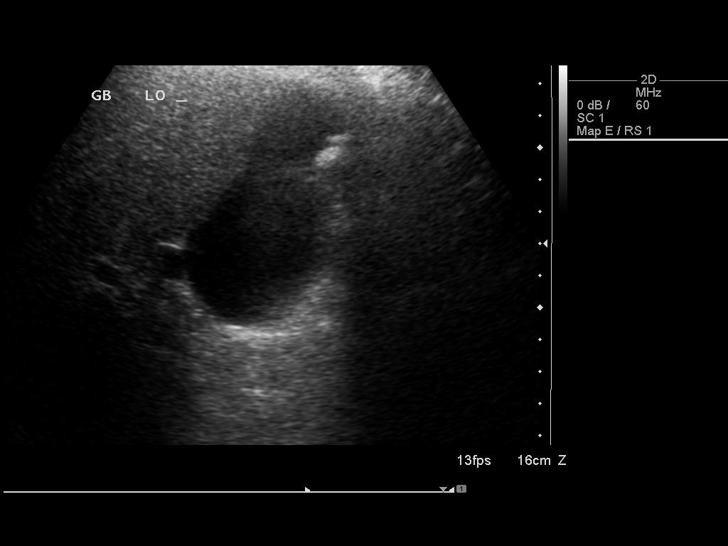
[im 63/63]
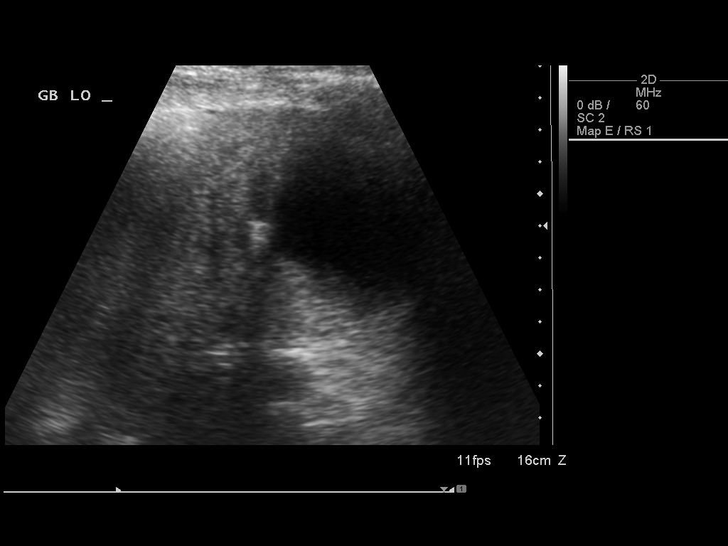

[13 of 25 positions shown; findings below may reference images not displayed]

FINDINGS: The study is somewhat limited due to the patient's recent back
surgery which limits changes in patient position. Also excessive
bowel gas limits the study.

Gallbladder: The gallbladder is mildly distended. There are multiple
gallstones with the largest measuring 1.6 cm in diameter. There is
sludge present. The gallbladder wall is mildly thickened at 5 mm.
There is no positive sonographic Murphy's sign. There is a trace of
pericholecystic fluid.

Common bile duct: Diameter: 4.1 mm where visualized.

Liver: The acoustical window for evaluation of the liver is limited.
The echotexture the liver is increased. No discrete hepatic mass or
ductal dilation is observed.

IVC: Non visualized due to bowel gas.

Pancreas: Nonvisualized due to bowel gas.

Spleen: Size and appearance within normal limits.

Right Kidney: Length: 10 cm. Bowel gas limits evaluation of the
renal parenchyma. The cortical echotexture remains lower than that
of the liver. There is no hydronephrosis.

Left Kidney: Length: 10.8 cm. Bowel gas limits evaluation of the
left kidney. The cortical echotexture is similar to that on the
right. There is no hydronephrosis.

Abdominal aorta: Bowel gas obscures all but the distal aorta which
appears normal.

Other findings: None.
IMPRESSION: Gallstones with mild gallbladder distention,wall thickening, and
pericholecystic fluid. This may reflect subacute or acute
cholecystitis. No sonographic Murphy's sign is observed however.

Increased hepatic echotexture compatible with fatty infiltration.

Limited visualization of other abdominal organs as described.

## 2018-05-22 DIAGNOSIS — C61 Malignant neoplasm of prostate: Secondary | ICD-10-CM | POA: Diagnosis not present

## 2018-05-28 DIAGNOSIS — Z4802 Encounter for removal of sutures: Secondary | ICD-10-CM | POA: Diagnosis not present

## 2018-05-29 ENCOUNTER — Other Ambulatory Visit: Payer: Self-pay | Admitting: Urology

## 2018-05-29 DIAGNOSIS — C7951 Secondary malignant neoplasm of bone: Secondary | ICD-10-CM | POA: Diagnosis not present

## 2018-05-29 DIAGNOSIS — C61 Malignant neoplasm of prostate: Secondary | ICD-10-CM

## 2018-05-31 IMAGING — NM NM HEPATO W/GB/PHARM/[PERSON_NAME]
2 series · 12 of 12 positions shown · non-contrast
Comparison: Abdominal ultrasound March 14, 2016

CLINICAL DATA: Right upper quadrant pain for 2 months.

EXAM:
NUCLEAR MEDICINE HEPATOBILIARY IMAGING WITH GALLBLADDER EF
Views:  Anterior, right lateral right upper quadrant
RADIOPHARMACEUTICALS:  5.2 mCi 1c-KKm  Choletec IV

[he hepatobiliary · 3.43mm/px · 6 of 60 frames shown (1 of 2)]
[frame 6/60]
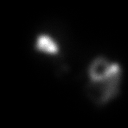
[frame 16/60]
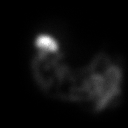
[frame 26/60]
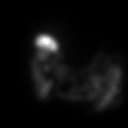
[frame 36/60]
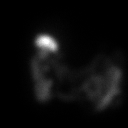
[frame 46/60]
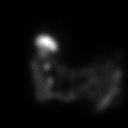
[frame 56/60]
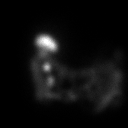

[he hepatobiliary · 3.43mm/px · 6 of 52 frames shown (2 of 2)]
[frame 5/52]
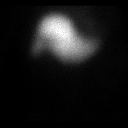
[frame 13/52]
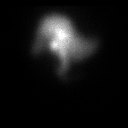
[frame 22/52]
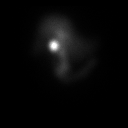
[frame 31/52]
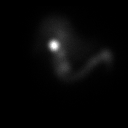
[frame 39/52]
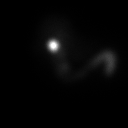
[frame 48/52]
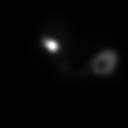

[12 of 12 positions shown; findings below may reference images not displayed]

FINDINGS: Liver uptake of radiotracer is normal. There is prompt visualization
of gallbladder and small bowel, consistent with patency of the
cystic and common bile ducts. The patient consumed 8 ounces of
Ensure Complete orally with calculation of the computer generated
ejection fraction of radiotracer from the gallbladder. The patient
did not experience clinical symptoms with the oral Ensure Complete
consumption. The computer generated ejection fraction of radiotracer
from the gallbladder is abnormally low at 26%, normal greater than
33% using the oral agent.
IMPRESSION: Abnormally low ejection fraction of radiotracer from the
gallbladder, a finding that is felt to be concerning for biliary
dyskinesia. Cystic and common bile ducts are patent as is evidenced
by visualization of gallbladder and small bowel.

## 2018-06-12 DIAGNOSIS — J90 Pleural effusion, not elsewhere classified: Secondary | ICD-10-CM | POA: Diagnosis not present

## 2018-06-12 DIAGNOSIS — Z6831 Body mass index (BMI) 31.0-31.9, adult: Secondary | ICD-10-CM | POA: Diagnosis not present

## 2018-06-12 DIAGNOSIS — F39 Unspecified mood [affective] disorder: Secondary | ICD-10-CM | POA: Diagnosis not present

## 2018-06-12 DIAGNOSIS — C61 Malignant neoplasm of prostate: Secondary | ICD-10-CM | POA: Diagnosis not present

## 2018-06-12 DIAGNOSIS — N183 Chronic kidney disease, stage 3 (moderate): Secondary | ICD-10-CM | POA: Diagnosis not present

## 2018-06-12 DIAGNOSIS — E1122 Type 2 diabetes mellitus with diabetic chronic kidney disease: Secondary | ICD-10-CM | POA: Diagnosis not present

## 2018-06-12 DIAGNOSIS — Z85118 Personal history of other malignant neoplasm of bronchus and lung: Secondary | ICD-10-CM | POA: Diagnosis not present

## 2018-06-12 DIAGNOSIS — K5090 Crohn's disease, unspecified, without complications: Secondary | ICD-10-CM | POA: Diagnosis not present

## 2018-06-12 DIAGNOSIS — I10 Essential (primary) hypertension: Secondary | ICD-10-CM | POA: Diagnosis not present

## 2018-06-29 DIAGNOSIS — H35033 Hypertensive retinopathy, bilateral: Secondary | ICD-10-CM | POA: Diagnosis not present

## 2018-06-29 DIAGNOSIS — H40013 Open angle with borderline findings, low risk, bilateral: Secondary | ICD-10-CM | POA: Diagnosis not present

## 2018-06-29 DIAGNOSIS — H2513 Age-related nuclear cataract, bilateral: Secondary | ICD-10-CM | POA: Diagnosis not present

## 2018-06-29 DIAGNOSIS — E119 Type 2 diabetes mellitus without complications: Secondary | ICD-10-CM | POA: Diagnosis not present

## 2018-06-29 DIAGNOSIS — H25013 Cortical age-related cataract, bilateral: Secondary | ICD-10-CM | POA: Diagnosis not present

## 2018-07-21 DIAGNOSIS — D225 Melanocytic nevi of trunk: Secondary | ICD-10-CM | POA: Diagnosis not present

## 2018-07-21 DIAGNOSIS — D2262 Melanocytic nevi of left upper limb, including shoulder: Secondary | ICD-10-CM | POA: Diagnosis not present

## 2018-07-21 DIAGNOSIS — D0462 Carcinoma in situ of skin of left upper limb, including shoulder: Secondary | ICD-10-CM | POA: Diagnosis not present

## 2018-07-21 DIAGNOSIS — L821 Other seborrheic keratosis: Secondary | ICD-10-CM | POA: Diagnosis not present

## 2018-07-21 DIAGNOSIS — L570 Actinic keratosis: Secondary | ICD-10-CM | POA: Diagnosis not present

## 2018-07-21 DIAGNOSIS — D2261 Melanocytic nevi of right upper limb, including shoulder: Secondary | ICD-10-CM | POA: Diagnosis not present

## 2018-07-21 DIAGNOSIS — Z85828 Personal history of other malignant neoplasm of skin: Secondary | ICD-10-CM | POA: Diagnosis not present

## 2018-07-29 DIAGNOSIS — D0462 Carcinoma in situ of skin of left upper limb, including shoulder: Secondary | ICD-10-CM | POA: Diagnosis not present

## 2018-07-29 DIAGNOSIS — Z85828 Personal history of other malignant neoplasm of skin: Secondary | ICD-10-CM | POA: Diagnosis not present

## 2018-08-31 ENCOUNTER — Other Ambulatory Visit: Payer: Self-pay

## 2018-08-31 ENCOUNTER — Inpatient Hospital Stay: Payer: Medicare Other | Attending: Internal Medicine

## 2018-08-31 ENCOUNTER — Ambulatory Visit (HOSPITAL_COMMUNITY)
Admission: RE | Admit: 2018-08-31 | Discharge: 2018-08-31 | Disposition: A | Discharge status: Home / Self Care | Payer: Medicare Other | Source: Ambulatory Visit | Attending: Internal Medicine | Admitting: Internal Medicine

## 2018-08-31 ENCOUNTER — Encounter (HOSPITAL_COMMUNITY): Payer: Self-pay

## 2018-08-31 DIAGNOSIS — C3490 Malignant neoplasm of unspecified part of unspecified bronchus or lung: Secondary | ICD-10-CM | POA: Diagnosis not present

## 2018-08-31 DIAGNOSIS — Z85118 Personal history of other malignant neoplasm of bronchus and lung: Secondary | ICD-10-CM | POA: Diagnosis not present

## 2018-08-31 DIAGNOSIS — C3492 Malignant neoplasm of unspecified part of left bronchus or lung: Secondary | ICD-10-CM | POA: Diagnosis not present

## 2018-08-31 LAB — CMP (CANCER CENTER ONLY)
ALT: 28 U/L (ref 0–44)
AST: 26 U/L (ref 15–41)
Albumin: 3.4 g/dL — ABNORMAL LOW (ref 3.5–5.0)
Alkaline Phosphatase: 99 U/L (ref 38–126)
Anion gap: 11 (ref 5–15)
BUN: 25 mg/dL — ABNORMAL HIGH (ref 8–23)
CO2: 25 mmol/L (ref 22–32)
Calcium: 9.4 mg/dL (ref 8.9–10.3)
Chloride: 104 mmol/L (ref 98–111)
Creatinine: 1.52 mg/dL — ABNORMAL HIGH (ref 0.61–1.24)
GFR, Est AFR Am: 51 mL/min — ABNORMAL LOW (ref >60)
GFR, Estimated: 44 mL/min — ABNORMAL LOW (ref >60)
Glucose, Bld: 163 mg/dL — ABNORMAL HIGH (ref 70–99)
Potassium: 4.4 mmol/L (ref 3.5–5.1)
Sodium: 140 mmol/L (ref 135–145)
Total Bilirubin: 0.3 mg/dL (ref 0.3–1.2)
Total Protein: 7.1 g/dL (ref 6.5–8.1)

## 2018-08-31 LAB — CBC WITH DIFFERENTIAL (CANCER CENTER ONLY)
Abs Immature Granulocytes: 0.04 10*3/uL (ref 0.00–0.07)
Basophils Absolute: 0 10*3/uL (ref 0.0–0.1)
Basophils Relative: 0 %
Eosinophils Absolute: 0.2 10*3/uL (ref 0.0–0.5)
Eosinophils Relative: 1 %
HCT: 39.5 % (ref 39.0–52.0)
Hemoglobin: 12.7 g/dL — ABNORMAL LOW (ref 13.0–17.0)
Immature Granulocytes: 0 %
Lymphocytes Relative: 18 %
Lymphs Abs: 2 10*3/uL (ref 0.7–4.0)
MCH: 30.1 pg (ref 26.0–34.0)
MCHC: 32.2 g/dL (ref 30.0–36.0)
MCV: 93.6 fL (ref 80.0–100.0)
Monocytes Absolute: 0.7 10*3/uL (ref 0.1–1.0)
Monocytes Relative: 6 %
Neutro Abs: 7.8 10*3/uL — ABNORMAL HIGH (ref 1.7–7.7)
Neutrophils Relative %: 75 %
Platelet Count: 282 10*3/uL (ref 150–400)
RBC: 4.22 MIL/uL (ref 4.22–5.81)
RDW: 13.5 % (ref 11.5–15.5)
WBC Count: 10.6 10*3/uL — ABNORMAL HIGH (ref 4.0–10.5)
nRBC: 0 % (ref 0.0–0.2)

## 2018-09-04 IMAGING — CR DG CHEST 2V
2 series · 2 of 2 positions shown · non-contrast
Comparison: February 08, 2016

CLINICAL DATA: Shortness of Breath.  History of lung carcinoma

EXAM:
CHEST  2 VIEW

[w chest pa]
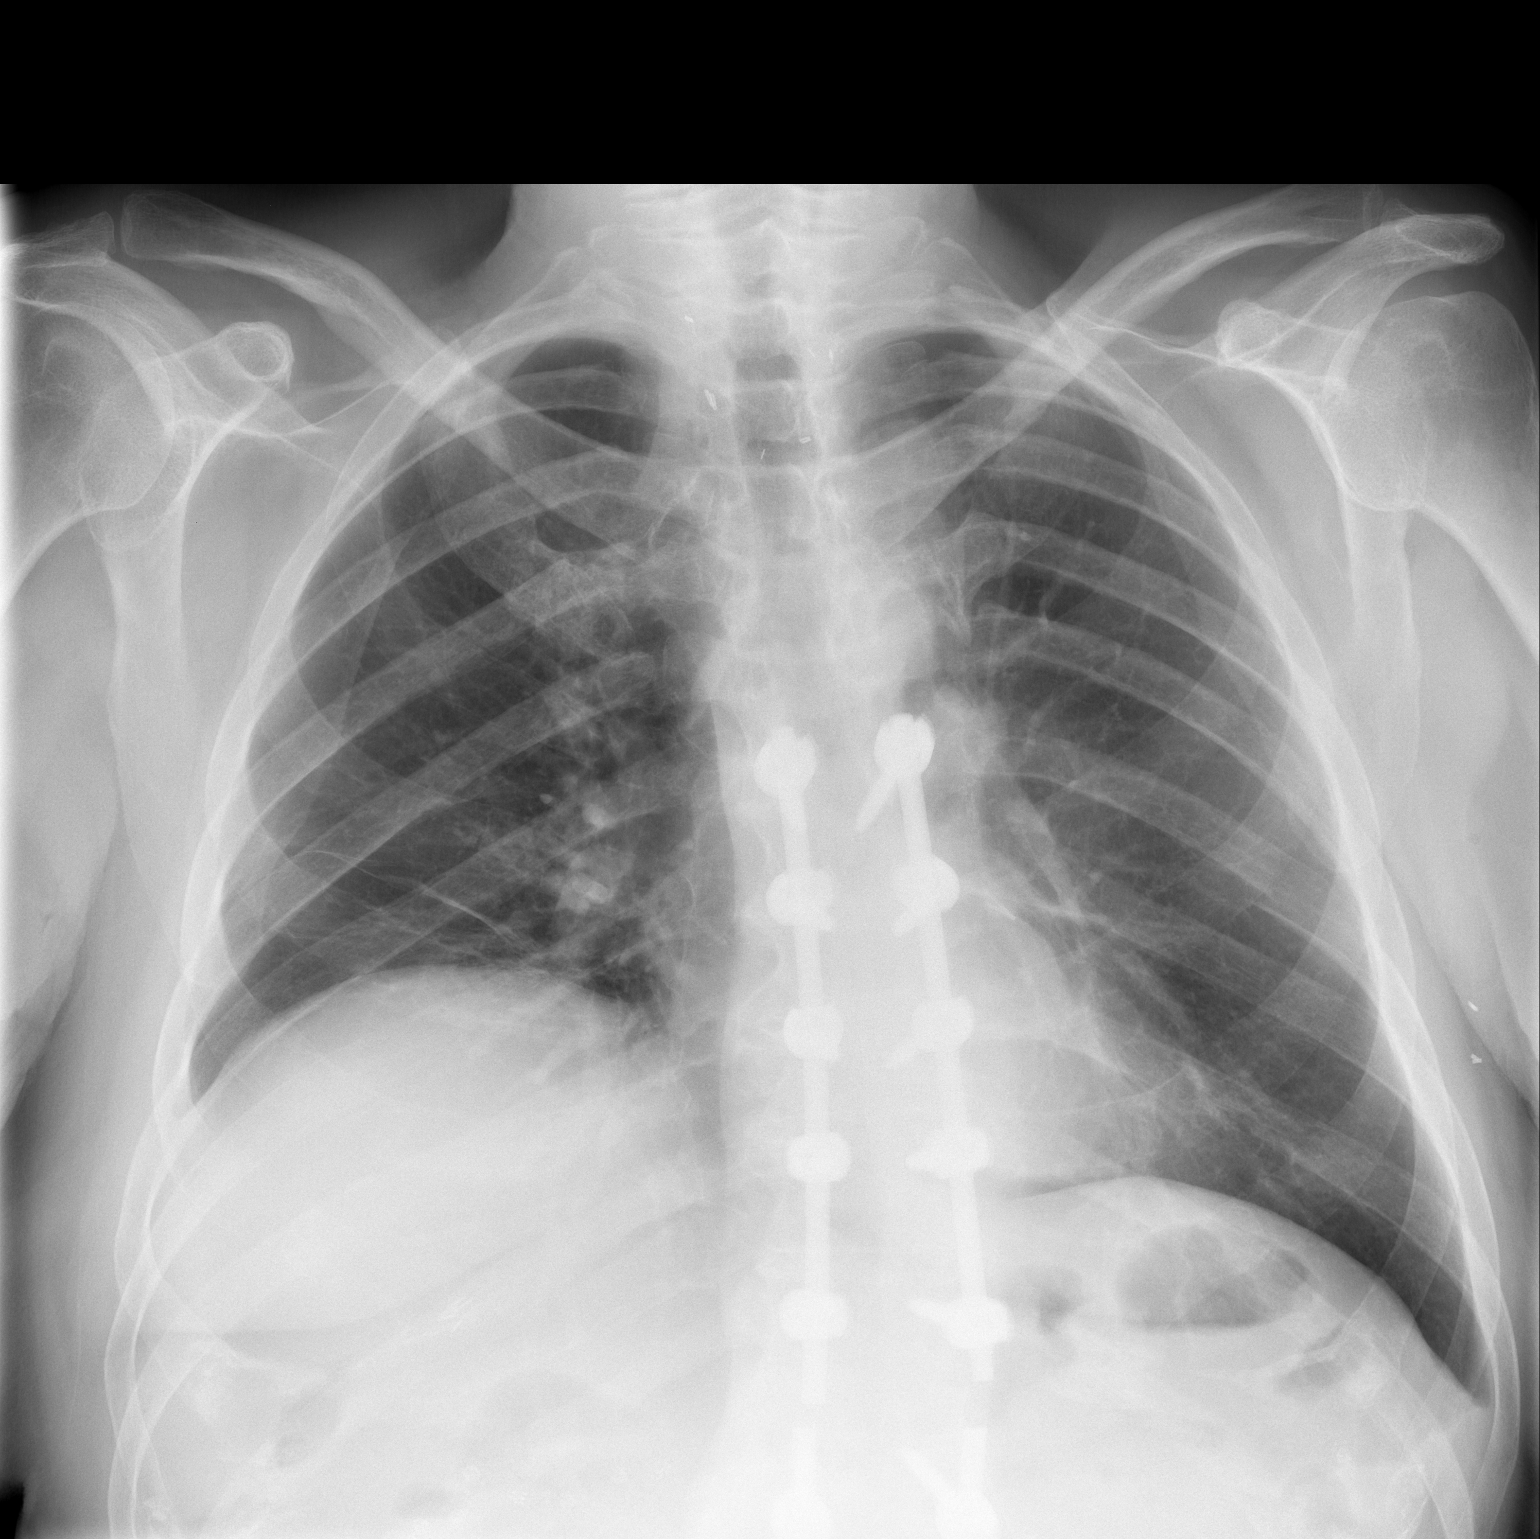

[w chest lat]
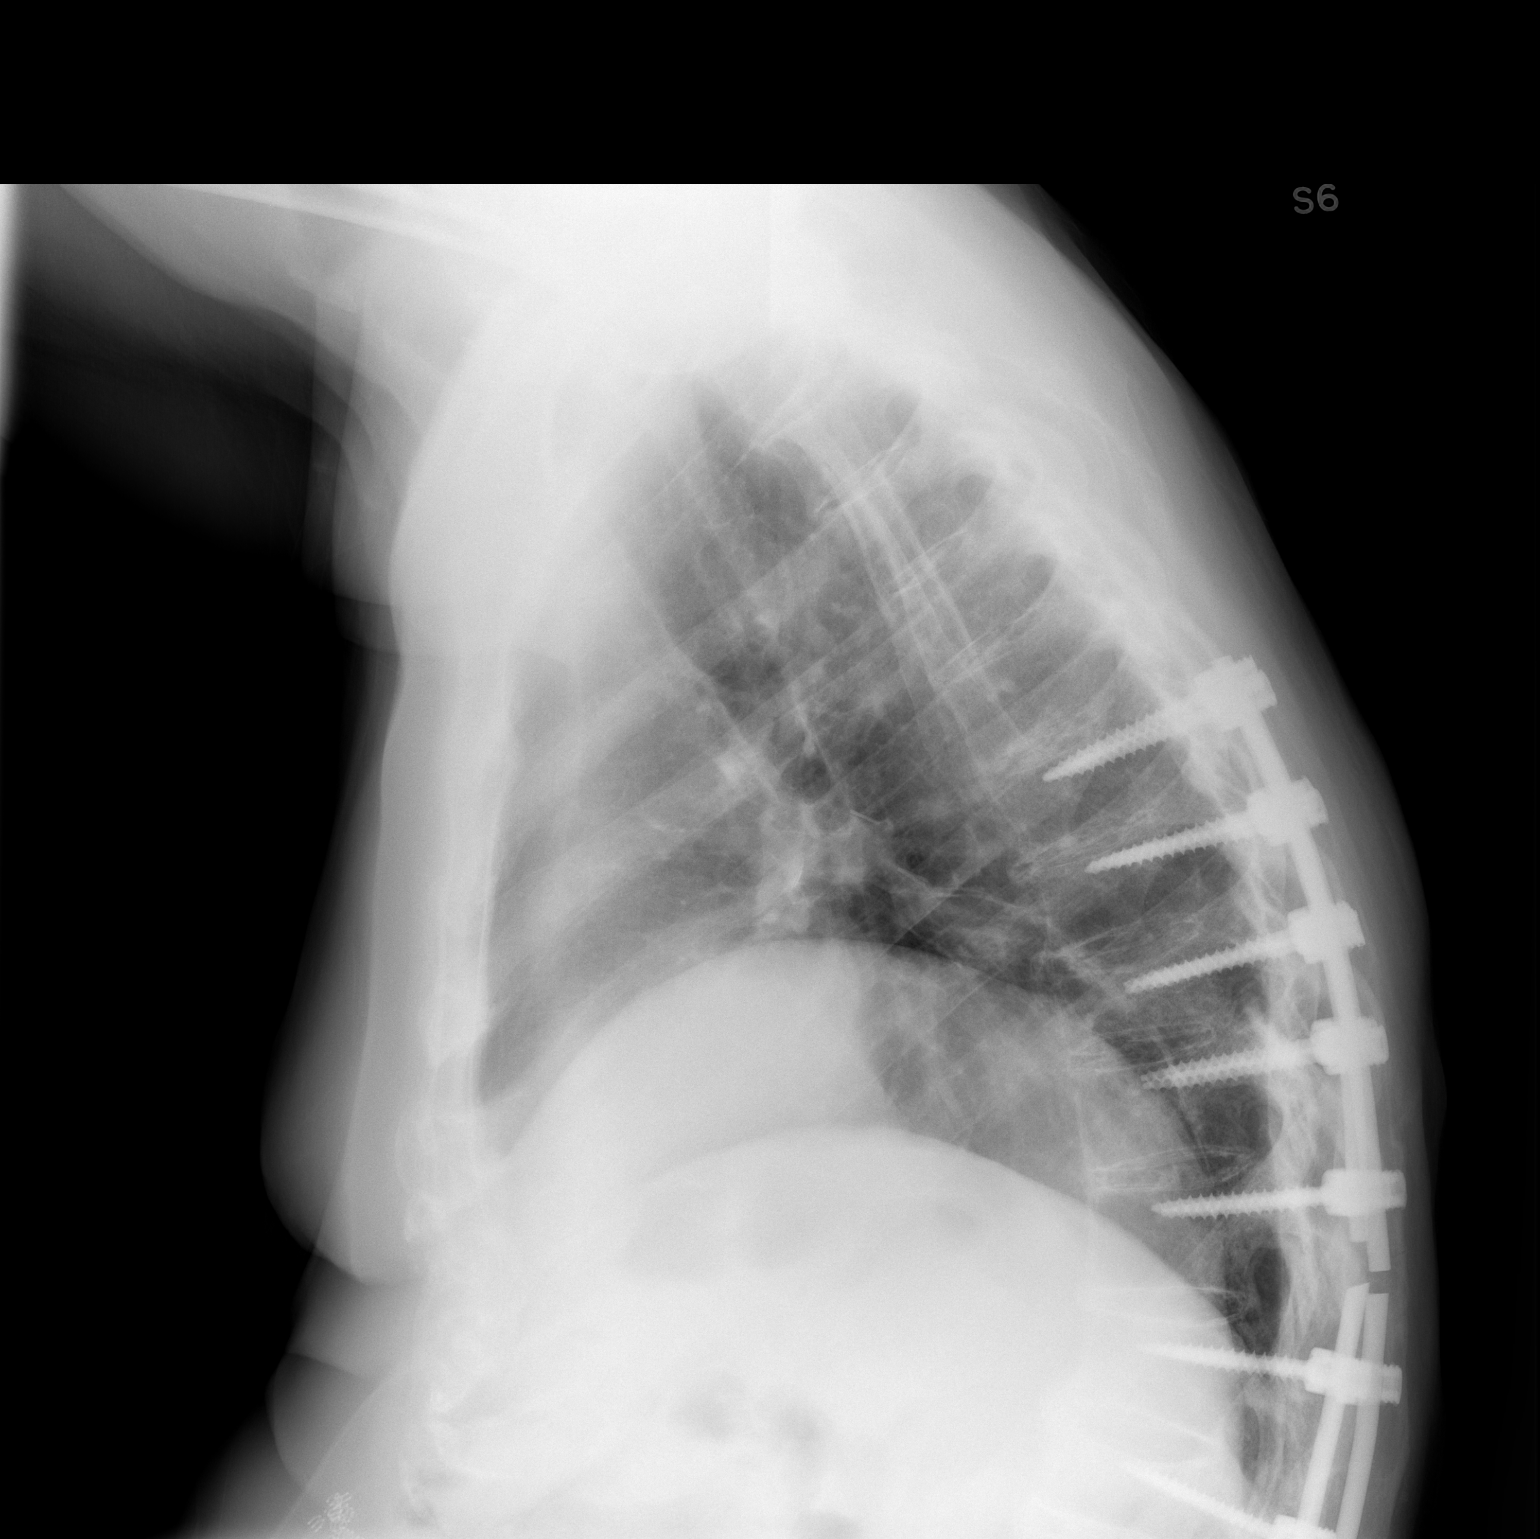

[2 of 2 positions shown; findings below may reference images not displayed]

FINDINGS: There is stable elevation of the right hemidiaphragm. There is
scarring in the right base. There is postoperative change on the
left. There is no edema or consolidation. Heart size and pulmonary
vascularity are normal. No evident adenopathy. There is screw and
plate fixation throughout the visualized thoracic and upper lumbar
regions. Note that there are fractures of the plates bilaterally at
the level of T12-L1. There are surgical clips in the upper thoracic
region in the midline.
IMPRESSION: Scarring right base. Postoperative change on the left. Mild
elevation right hemidiaphragm, stable. No edema or consolidation.
Stable cardiac silhouette.

There are fractures of plates transfixing portions of the thoracic
and lumbar spine at the T12-L1 level bilaterally.

## 2018-09-07 ENCOUNTER — Inpatient Hospital Stay (HOSPITAL_BASED_OUTPATIENT_CLINIC_OR_DEPARTMENT_OTHER): Payer: Medicare Other | Admitting: Internal Medicine

## 2018-09-07 ENCOUNTER — Other Ambulatory Visit: Payer: Self-pay

## 2018-09-07 DIAGNOSIS — C3490 Malignant neoplasm of unspecified part of unspecified bronchus or lung: Secondary | ICD-10-CM

## 2018-09-07 DIAGNOSIS — Z85850 Personal history of malignant neoplasm of thyroid: Secondary | ICD-10-CM

## 2018-09-07 NOTE — Progress Notes (Signed)
Virtual Visit via Telephone Note  I connected with Devin [name] on 09/07/18 at 10:15 AM EDT by telephone and verified that I am speaking with the correct person using two identifiers.   I discussed the limitations, risks, security and privacy concerns of performing an evaluation and management service by telephone and the availability of in person appointments. I also discussed with the patient that there may be a patient responsible charge related to this service. The patient expressed understanding and agreed to proceed.  DIAGNOSIS: Stage IA (T1b, N0, M0) non-small cell lung cancer, adenocarcinoma presenting with left upper lobe pulmonary nodule diagnosed in September 2016.  PRIOR THERAPY: Status post left VATS with left upper lobectomy and mediastinal lymph node dissection under the care of Dr. Prescott Gum on 03/16/2015.  CURRENT THERAPY: Observation.  History of Present Illness: Devin [name] 78 y.o. male had a telephone visual visit today.  The patient is feeling fine today with no concerning complaints.  He denied having any chest pain but has shortness of breath with exertion with no cough or hemoptysis.  He denied having any fever or chills.  He has no nausea, vomiting, diarrhea or constipation.  He denied having any headache or visual changes.  He had repeat CT scan of the chest performed recently and we have the visit to discuss his scan results and recommendation regarding his condition.  MEDICAL HISTORY: Past Medical History:  Diagnosis Date  . Adenocarcinoma of left lung, stage 1 ([place]) 03/10/2015  . Ankylosing spondylitis ([place])    Diagnosed during lumbar fracture summer of 2016    . Crohn's disease ([place])   . Gout   . HIstory of basal cell cancer of face    THYROID CA HX  . Hypertension   . Hypocalcemia 04/13/2007  . Hypothyroidism   . Impotence   . 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 Obesity (BMI 30-39.9)   . Osteoporosis    Pt completed  5 years of fosamax in 2013     . Prostate cancer with recurrence    Treated with prostatectomy with recurrence 2012 with Lupron treatment now him   . Psoriasis   . Thyroid cancer ([place]) 1999   Treated with RAI and total thyroidectomy   . Type II diabetes mellitus ([facility])     ALLERGIES:  has No Known Allergies.  MEDICATIONS:  Current Outpatient Medications  Medication Sig Dispense Refill  . acetaminophen (TYLENOL) 500 MG tablet Take 1,000 mg by mouth 2 (two) times daily.    Marland Kitchen ALPRAZolam (XANAX) 0.5 MG tablet TK 1 T PO Q 6 H PRN  0  . ASACOL HD 800 MG TBEC Take 1,600 mg by mouth 2 (two) times daily.  3  . atenolol (TENORMIN) 25 MG tablet Take 0.5 mg by mouth.    . B-D ULTRAFINE III SHORT PEN 31G X 8 MM MISC USE 1 PEN NEEDLE FOUR TIMES DAILY. 100 each 0  . busPIRone (BUSPAR) 15 MG tablet Take 15 mg by mouth.    . calcium carbonate (OS-CAL) 1250 (500 CA) MG chewable tablet Chew 1 tablet by mouth 2 (two) times daily.    . cefdinir (OMNICEF) 300 MG capsule TK ONE C PO BID FOR 10 DAYS  0  . cholestyramine light (PREVALITE) 4 g packet Take by mouth.    . fluorouracil (EFUDEX) 5 % cream APP ON SKIN BID FOR 2 WEEKS  2  . Glucose Blood (ASCENSIA CONTOUR TEST VI) Use as directed to test  two times a day     . glucose blood (BAYER CONTOUR TEST) test strip TEST BLOOD SUGAR TWICE DAILY AS DIRECTED. 200 each 2  . glucose blood ([employer] TEST STRIPS) test strip 1 each by Other route 2 times daily.    . insulin lispro (HUMALOG KWIKPEN) 100 UNIT/ML KiwkPen INJECT UNDER THE SKIN THREE TIMES DAILY( 25 UNITS, 25 UNITS AND 20 UNITS RESPECTIVELY BEFORE MEALS) 30 mL 5  . insulin lispro (HUMALOG) 100 UNIT/ML KiwkPen Inject into the skin.    . Insulin Pen Needle (FIFTY50 PEN NEEDLES) 31G X 8 MM MISC Inject 31 g into the skin.    Marland Kitchen leuprolide (LUPRON) 11.25 MG injection Inject 11.25 mg into the muscle every 6 (six) months.     . levothyroxine (SYNTHROID, LEVOTHROID) 200 MCG tablet TK 1 TP O QD ON AN EMPTY STOMACH WITH  A GLASS OF WATER  2  . lisinopril-hydrochlorothiazide (PRINZIDE,ZESTORETIC) 20-12.5 MG tablet Take 1 tablet by mouth daily. Needs office visit for additional refills 90 tablet 0  . Multiple Vitamins-Iron (DAILY-VITE/IRON/BETA-CAROTENE) TABS Take by mouth.    . Multiple Vitamins-Minerals (CENTRUM SILVER) tablet Take 1 tablet by mouth daily.    Marland Kitchen scopolamine (TRANSDERM-SCOP) 1 MG/3DAYS APP 1 PA BEHIND EAR 1 HOUR BEFORE EMBARACATION AND CHANGE Q 3 DAYS  0  . triamcinolone cream (KENALOG) 0.1 % Apply 1 application topically 2 (two) times daily. 30 g 0   No current facility-administered medications for this visit.     SURGICAL HISTORY:  Past Surgical History:  Procedure Laterality Date  . ABDOMINAL EXPLORATION SURGERY     for small bowel obstruction  . APPENDECTOMY  03/2007  . BACK SURGERY    . BASAL CELL CARCINOMA EXCISION  "several"   "head"  . CARDIAC CATHETERIZATION  03/17/2003  . CHOLECYSTECTOMY N/A 05/07/2016   Procedure: LAPAROSCOPIC CHOLECYSTECTOMY;  Surgeon: Fanny Skates, MD;  Location: [facility];  Service: General;  Laterality: N/A;  . COLON SURGERY  03/2007   Resection of cecum, appendix, terminal ileum (approximately/notes 10/10/2010  . HERNIA REPAIR    . LAPAROSCOPIC CHOLECYSTECTOMY  05/07/2016  . LAPAROSCOPIC LYSIS OF ADHESIONS  05/07/2016  . LAPAROSCOPIC LYSIS OF ADHESIONS N/A 05/07/2016   Procedure: LAPAROSCOPIC LYSIS OF ADHESIONS TIMES ONE HOUR;  Surgeon: Fanny Skates, MD;  Location: [facility];  Service: General;  Laterality: N/A;  . POSTERIOR FUSION THORACIC SPINE  02/08/2016   1. Posterior thoracic arthrodesis T7-T11 utilizing morcellized allograft, 2. Posterior thoracic segmental fixation T7-T11 utilizing nuvasive pedicle screws  . PROSTATECTOMY  06/2001   w/bilateral pelvic lymph nose dissection/notes 10/24/2010  . SPINAL FUSION  12/2014   Open reduction internal fixation of L1 Chance fracture with posterior fusion T10-L4 utilizing morcellized allograft and some local  autograft, segmental instrumentation T10-L4 inclusive utilizing nuvasive pedicle screws/notes 12/16/2014  . Stress Cardiolite  02/17/2003  . THOROCOTOMY WITH LOBECTOMY  03/16/2015   Procedure: THOROCOTOMY WITH LOBECTOMY;  Surgeon: Ivin Poot, MD;  Location: [facility];  Service: Thoracic;;  . TONSILLECTOMY    . TOTAL THYROIDECTOMY  1997  . Venous Doppler  05/30/2004  . VENTRAL HERNIA REPAIR  04/14/2008  . VIDEO ASSISTED THORACOSCOPY Left 03/16/2015   Procedure: VIDEO ASSISTED THORACOSCOPY;  Surgeon: Ivin Poot, MD;  Location: [facility];  Service: Thoracic;  Laterality: Left;    REVIEW OF SYSTEMS:  A comprehensive review of systems was negative except for: Respiratory: positive for dyspnea on exertion   LABORATORY DATA: Lab Results  Component Value Date   WBC 10.6 (H)  08/31/2018   HGB 12.7 (L) 08/31/2018   HCT 39.5 08/31/2018   MCV 93.6 08/31/2018   PLT 282 08/31/2018      Chemistry      Component Value Date/Time   NA 140 08/31/2018 1252   NA 140 02/18/2017 1011   K 4.4 08/31/2018 1252   K 4.5 02/18/2017 1011   CL 104 08/31/2018 1252   CO2 25 08/31/2018 1252   CO2 22 02/18/2017 1011   BUN 25 (H) 08/31/2018 1252   BUN 32.1 (H) 02/18/2017 1011   CREATININE 1.52 (H) 08/31/2018 1252   CREATININE 1.9 (H) 02/18/2017 1011      Component Value Date/Time   CALCIUM 9.4 08/31/2018 1252   CALCIUM 9.8 02/18/2017 1011   ALKPHOS 99 08/31/2018 1252   ALKPHOS 102 02/18/2017 1011   AST 26 08/31/2018 1252   AST 35 (H) 02/18/2017 1011   ALT 28 08/31/2018 1252   ALT 35 02/18/2017 1011   BILITOT 0.3 08/31/2018 1252   BILITOT 0.31 02/18/2017 1011       RADIOGRAPHIC STUDIES: Ct Chest Wo Contrast  Result Date: 08/31/2018 CLINICAL DATA:  78 year old male with history of left-sided lung cancer status post left upper lobectomy. Additional history of prostate cancer undergoing Lupron injections. History of thyroid cancer status post radioactive iodine treatment. EXAM: CT CHEST WITHOUT CONTRAST  TECHNIQUE: Multidetector CT imaging of the chest was performed following the standard protocol without IV contrast. COMPARISON:  Chest CT 02/25/2018. FINDINGS: Cardiovascular: Heart size is normal. There is no significant pericardial fluid, thickening or pericardial calcification. There is aortic atherosclerosis, as well as atherosclerosis of the great vessels of the mediastinum and the coronary arteries, including calcified atherosclerotic plaque in the left main, left anterior descending and right coronary arteries. Mediastinum/Nodes: No pathologically enlarged mediastinal or hilar lymph nodes. Please note that accurate exclusion of hilar adenopathy is limited on noncontrast CT scans. Esophagus is unremarkable in appearance. No axillary lymphadenopathy. Lungs/Pleura: Status post left upper lobectomy. Compensatory expansion of the left lower lobe. Moderate left pleural effusion lying dependently and in the sub pulmonic region, stable compared to the prior examination. No right pleural effusion. No consolidative airspace disease. No suspicious appearing pulmonary nodules or masses are noted. Areas of mild chronic linear scarring in the right lung base. Upper Abdomen: Aortic atherosclerosis. Status post cholecystectomy. Diffuse low attenuation throughout the visualized hepatic parenchyma, indicative of hepatic steatosis. Liver has a slightly shrunken appearance and nodular contour, suggesting underlying cirrhosis. Nonobstructive calculi noted within the upper pole collecting system of the right kidney measuring up to 11 mm. Musculoskeletal: Orthopedic Devin and screw fixation hardware throughout the lower thoracic and upper lumbar spine. There are no aggressive appearing lytic or blastic lesions noted in the visualized portions of the skeleton. IMPRESSION: 1. Status post left upper lobectomy with stable postoperative findings in the chest and no evidence to suggest local recurrence or metastatic disease in the thorax.  2. Aortic atherosclerosis, in addition to left main and 2 vessel coronary artery disease. Assessment for potential risk factor modification, dietary therapy or pharmacologic therapy may be warranted, if clinically indicated. 3. Hepatic steatosis. 4. Morphologic changes in the liver suggestive of cirrhosis. 5. Nonobstructive calculi in the upper pole collecting system measuring up to 11 mm. Aortic Atherosclerosis (ICD10-I70.0). Electronically Signed   By: Vinnie Langton M.D.   On: 08/31/2018 16:17   Assessment and Plan: This is a very pleasant 78 years old white male with a stage Ia non-small cell lung cancer diagnosed in September  2016 status post left upper lobectomy and has been observation since that time. The patient is feeling fine today with no concerning complaints. I discussed the scan results with the patient today. His scan showed no concerning findings for disease progression or recurrence. I recommended for him to continue on observation with repeat CT scan of the chest in 1 year. He was advised to call immediately if he has any concerning symptoms in the interval.  Follow Up Instructions: Follow-up visit in 1 year with repeat CT scan of the chest.   I discussed the assessment and treatment plan with the patient. The patient was provided an opportunity to ask questions and all were answered. The patient agreed with the plan and demonstrated an understanding of the instructions.   The patient was advised to call back or seek an in-person evaluation if the symptoms worsen or if the condition fails to improve as anticipated.  I provided 15 minutes of non-face-to-face time during this encounter.   Eilleen Kempf, MD

## 2018-09-08 ENCOUNTER — Telehealth: Payer: Self-pay | Admitting: Internal Medicine

## 2018-09-08 NOTE — Telephone Encounter (Signed)
Scheduled appt per 3/30 sch message - 1 yr follow up - sent reminder letter in the mail with appt date and time

## 2018-09-11 DIAGNOSIS — Z85828 Personal history of other malignant neoplasm of skin: Secondary | ICD-10-CM | POA: Diagnosis not present

## 2018-09-11 DIAGNOSIS — L929 Granulomatous disorder of the skin and subcutaneous tissue, unspecified: Secondary | ICD-10-CM | POA: Diagnosis not present

## 2018-09-14 DIAGNOSIS — E038 Other specified hypothyroidism: Secondary | ICD-10-CM | POA: Diagnosis not present

## 2018-09-14 DIAGNOSIS — R82998 Other abnormal findings in urine: Secondary | ICD-10-CM | POA: Diagnosis not present

## 2018-09-14 DIAGNOSIS — E7849 Other hyperlipidemia: Secondary | ICD-10-CM | POA: Diagnosis not present

## 2018-09-14 DIAGNOSIS — Z125 Encounter for screening for malignant neoplasm of prostate: Secondary | ICD-10-CM | POA: Diagnosis not present

## 2018-09-14 DIAGNOSIS — E1122 Type 2 diabetes mellitus with diabetic chronic kidney disease: Secondary | ICD-10-CM | POA: Diagnosis not present

## 2018-09-18 IMAGING — CT CT CHEST W/O CM
2 of 3 series · 15 of 36 positions shown, 18 images · non-contrast
Comparison: 01/10/2016

CLINICAL DATA: Lung cancer restaging with history of left lobectomy

EXAM:
CT CHEST WITHOUT CONTRAST
TECHNIQUE: Multidetector CT imaging of the chest was performed following the
standard protocol without IV contrast.

[Series 2: thorax · axial · 0.87mm/px · z∈[+1026,+1314]mm · 12 of 170 slices shown, 15 images]
[im 13/170  mediastinal]
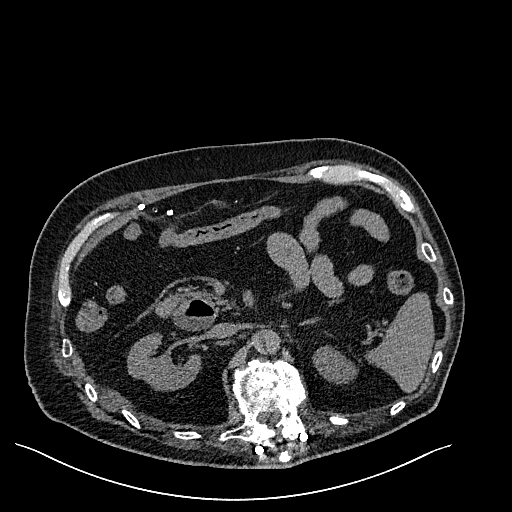
[im 13/170  lung]
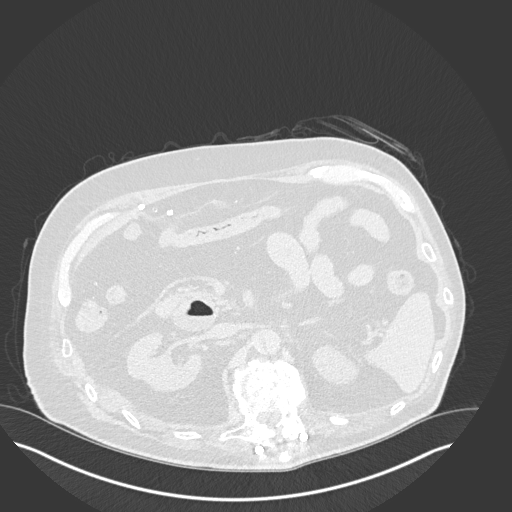
[im 26/170  lung]
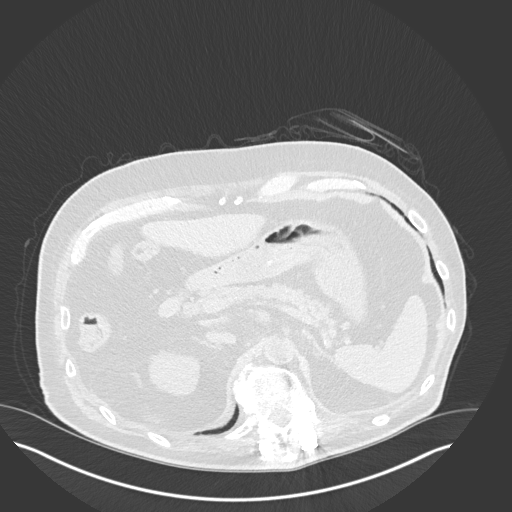
[im 38/170  lung]
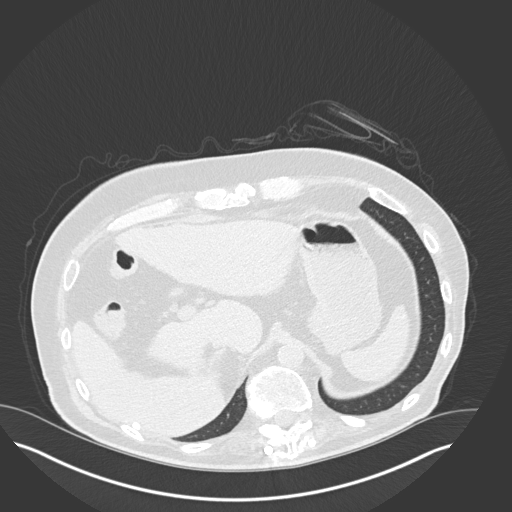
[im 51/170  lung]
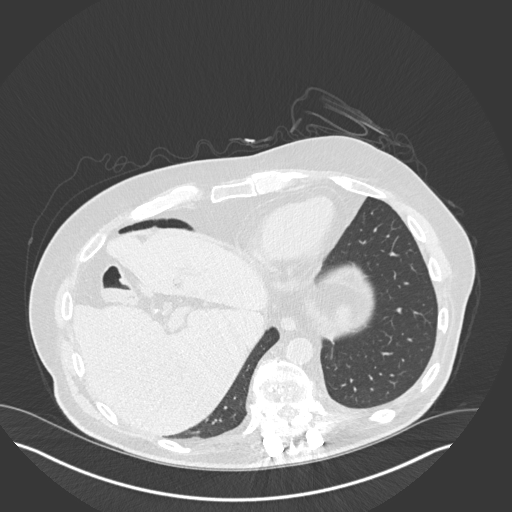
[im 63/170  mediastinal]
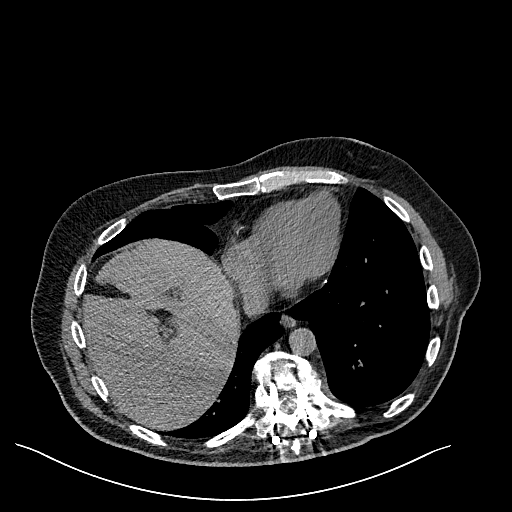
[im 63/170  lung]
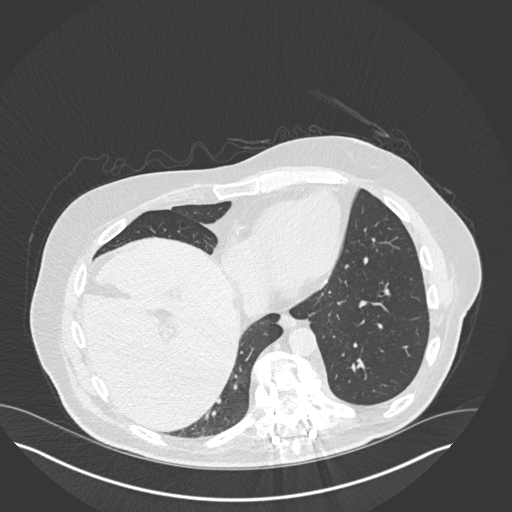
[im 76/170  lung]
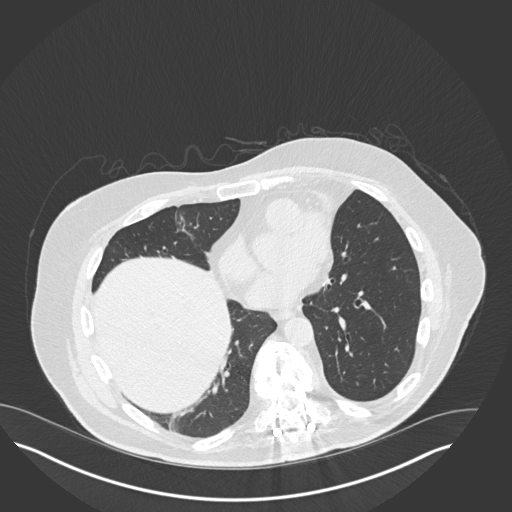
[im 94/170  lung]
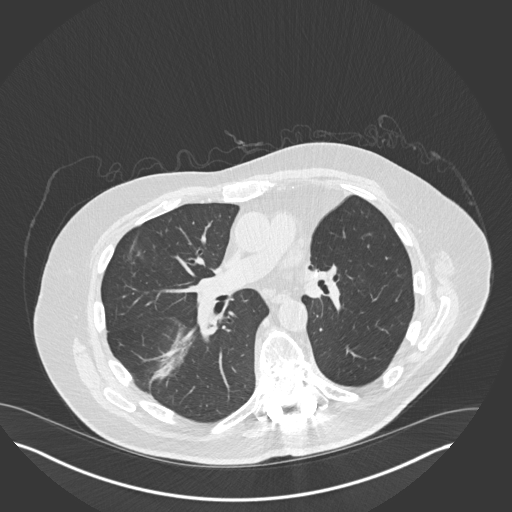
[im 107/170  lung]
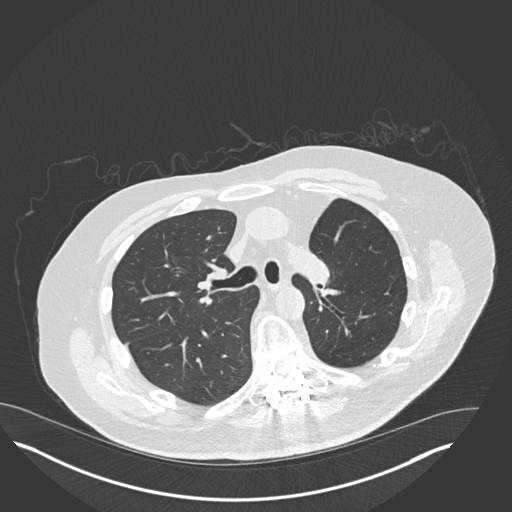
[im 119/170  mediastinal]
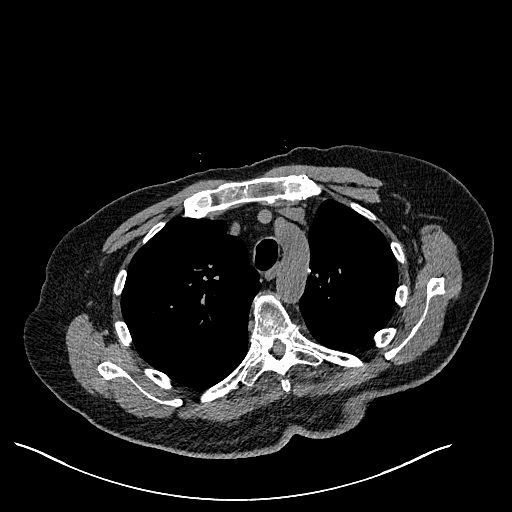
[im 119/170  lung]
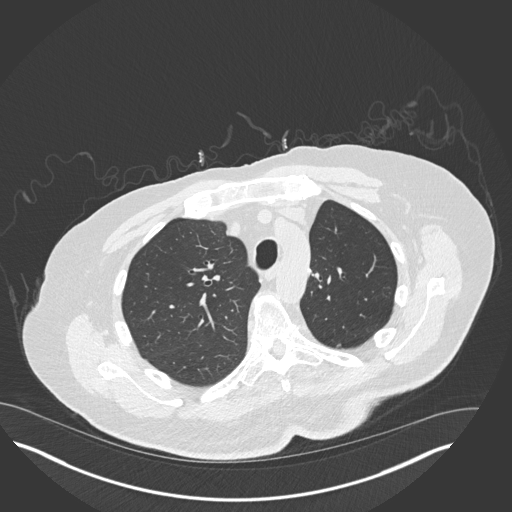
[im 132/170  lung]
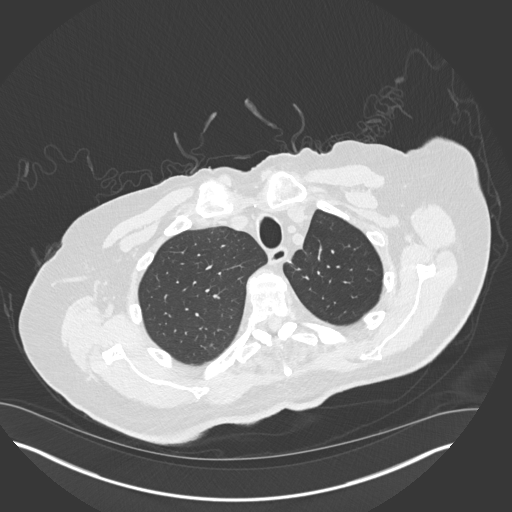
[im 144/170  lung]
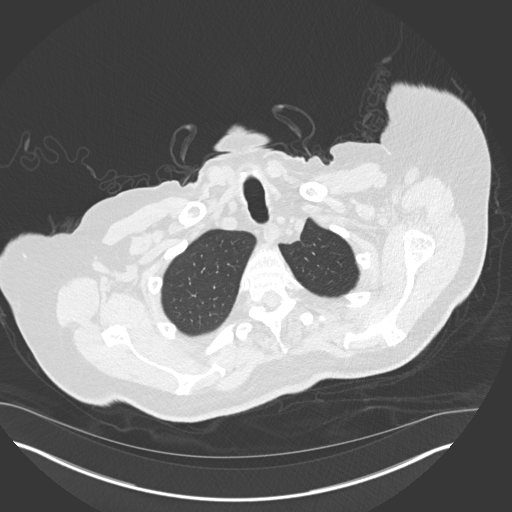
[im 157/170  lung]
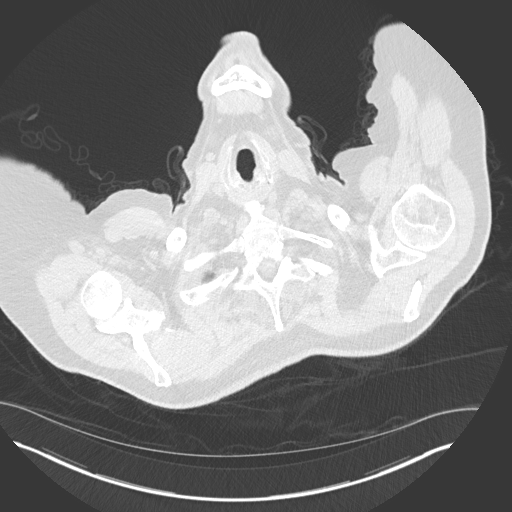

[Series 6: coronal · coronal · 0.66mm/px · 3 of 151 slices shown]
[im 31/151  lung]
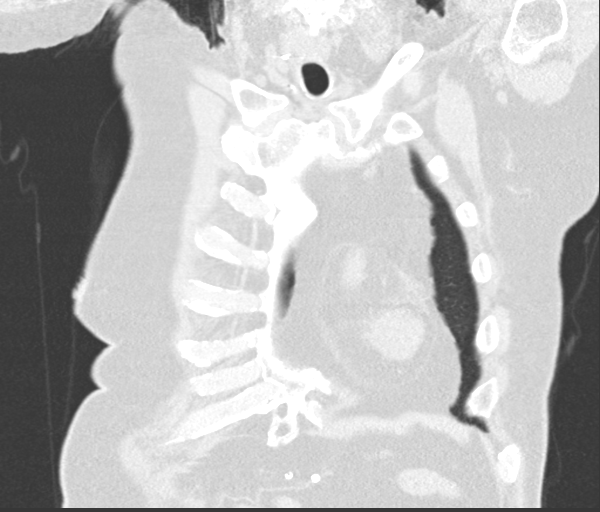
[im 61/151  lung]
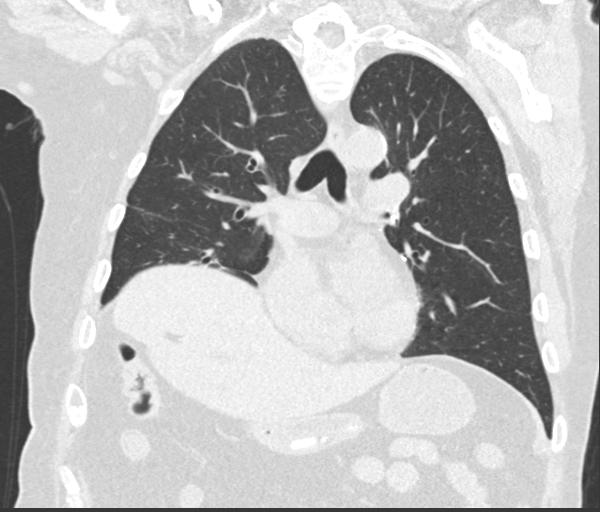
[im 91/151  lung]
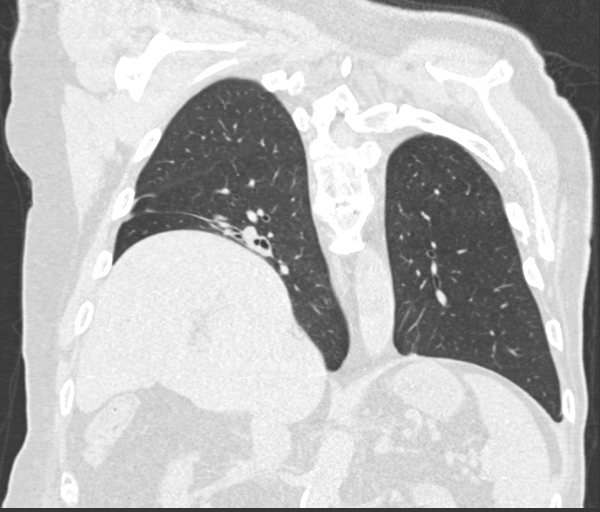

[15 of 36 positions shown; findings below may reference images not displayed]

FINDINGS: Cardiovascular: Heart size normal. Coronary artery calcification is
noted. No thoracic aortic aneurysm.

Mediastinum/Nodes: No mediastinal lymphadenopathy. No evidence for
gross hilar lymphadenopathy although assessment is limited by the
lack of intravenous contrast on today's study. The esophagus has
normal imaging features. There is no axillary lymphadenopathy.

Lungs/Pleura: 4 mm left upper lung pulmonary nodule is stable.
Volume loss left hemithorax compatible with reported history of left
lobectomy. 4 mm subpleural nodule left upper hemithorax (image 51)
is stable. Asymmetric elevation of the right hemidiaphragm with
overlying chronic atelectasis and/ or scarring is unchanged. No
focal airspace consolidation. No pulmonary edema or pleural
effusion.

Upper Abdomen: Interval cholecystectomy. 6 mm nonobstructing stone
identified interpolar right kidney. No adrenal nodule or mass.
Ventral mesh incompletely visualized.

Musculoskeletal: Extensive thoracic spinal fusion again noted.
IMPRESSION: 1. Stable exam. No new or progressive findings to suggest recurrent
disease.
2. Interval cholecystectomy.
3. Nonobstructing right renal stone.

## 2018-09-29 DIAGNOSIS — C61 Malignant neoplasm of prostate: Secondary | ICD-10-CM | POA: Diagnosis not present

## 2018-09-29 DIAGNOSIS — Z85850 Personal history of malignant neoplasm of thyroid: Secondary | ICD-10-CM | POA: Diagnosis not present

## 2018-09-29 DIAGNOSIS — Z1331 Encounter for screening for depression: Secondary | ICD-10-CM | POA: Diagnosis not present

## 2018-09-29 DIAGNOSIS — I10 Essential (primary) hypertension: Secondary | ICD-10-CM | POA: Diagnosis not present

## 2018-09-29 DIAGNOSIS — Z85118 Personal history of other malignant neoplasm of bronchus and lung: Secondary | ICD-10-CM | POA: Diagnosis not present

## 2018-09-29 DIAGNOSIS — N183 Chronic kidney disease, stage 3 (moderate): Secondary | ICD-10-CM | POA: Diagnosis not present

## 2018-09-29 DIAGNOSIS — E785 Hyperlipidemia, unspecified: Secondary | ICD-10-CM | POA: Diagnosis not present

## 2018-09-29 DIAGNOSIS — K5090 Crohn's disease, unspecified, without complications: Secondary | ICD-10-CM | POA: Diagnosis not present

## 2018-09-29 DIAGNOSIS — J90 Pleural effusion, not elsewhere classified: Secondary | ICD-10-CM | POA: Diagnosis not present

## 2018-09-29 DIAGNOSIS — F39 Unspecified mood [affective] disorder: Secondary | ICD-10-CM | POA: Diagnosis not present

## 2018-09-29 DIAGNOSIS — E1122 Type 2 diabetes mellitus with diabetic chronic kidney disease: Secondary | ICD-10-CM | POA: Diagnosis not present

## 2018-09-29 DIAGNOSIS — Z0000 Encounter for general adult medical examination without abnormal findings: Secondary | ICD-10-CM | POA: Diagnosis not present

## 2018-10-29 ENCOUNTER — Other Ambulatory Visit: Payer: Medicare Other

## 2018-11-25 DIAGNOSIS — D1801 Hemangioma of skin and subcutaneous tissue: Secondary | ICD-10-CM | POA: Diagnosis not present

## 2018-11-25 DIAGNOSIS — L723 Sebaceous cyst: Secondary | ICD-10-CM | POA: Diagnosis not present

## 2018-11-25 DIAGNOSIS — L570 Actinic keratosis: Secondary | ICD-10-CM | POA: Diagnosis not present

## 2018-11-25 DIAGNOSIS — Z85828 Personal history of other malignant neoplasm of skin: Secondary | ICD-10-CM | POA: Diagnosis not present

## 2018-11-25 DIAGNOSIS — L821 Other seborrheic keratosis: Secondary | ICD-10-CM | POA: Diagnosis not present

## 2018-11-25 DIAGNOSIS — D0462 Carcinoma in situ of skin of left upper limb, including shoulder: Secondary | ICD-10-CM | POA: Diagnosis not present

## 2018-12-02 DIAGNOSIS — C61 Malignant neoplasm of prostate: Secondary | ICD-10-CM | POA: Diagnosis not present

## 2018-12-09 DIAGNOSIS — C61 Malignant neoplasm of prostate: Secondary | ICD-10-CM | POA: Diagnosis not present

## 2018-12-09 DIAGNOSIS — C7951 Secondary malignant neoplasm of bone: Secondary | ICD-10-CM | POA: Diagnosis not present

## 2018-12-21 ENCOUNTER — Other Ambulatory Visit: Payer: Self-pay

## 2018-12-21 ENCOUNTER — Ambulatory Visit
Admission: RE | Admit: 2018-12-21 | Discharge: 2018-12-21 | Disposition: A | Discharge status: Home / Self Care | Payer: Medicare Other | Source: Ambulatory Visit | Attending: Urology | Admitting: Urology

## 2018-12-21 DIAGNOSIS — Z13820 Encounter for screening for osteoporosis: Secondary | ICD-10-CM | POA: Diagnosis not present

## 2018-12-21 DIAGNOSIS — C7951 Secondary malignant neoplasm of bone: Secondary | ICD-10-CM

## 2018-12-21 DIAGNOSIS — C61 Malignant neoplasm of prostate: Secondary | ICD-10-CM

## 2019-01-01 DIAGNOSIS — N183 Chronic kidney disease, stage 3 (moderate): Secondary | ICD-10-CM | POA: Diagnosis not present

## 2019-01-01 DIAGNOSIS — C61 Malignant neoplasm of prostate: Secondary | ICD-10-CM | POA: Diagnosis not present

## 2019-01-01 DIAGNOSIS — I129 Hypertensive chronic kidney disease with stage 1 through stage 4 chronic kidney disease, or unspecified chronic kidney disease: Secondary | ICD-10-CM | POA: Diagnosis not present

## 2019-01-01 DIAGNOSIS — F39 Unspecified mood [affective] disorder: Secondary | ICD-10-CM | POA: Diagnosis not present

## 2019-01-01 DIAGNOSIS — E1122 Type 2 diabetes mellitus with diabetic chronic kidney disease: Secondary | ICD-10-CM | POA: Diagnosis not present

## 2019-01-01 DIAGNOSIS — E039 Hypothyroidism, unspecified: Secondary | ICD-10-CM | POA: Diagnosis not present

## 2019-01-01 DIAGNOSIS — K5090 Crohn's disease, unspecified, without complications: Secondary | ICD-10-CM | POA: Diagnosis not present

## 2019-01-06 DIAGNOSIS — E1122 Type 2 diabetes mellitus with diabetic chronic kidney disease: Secondary | ICD-10-CM | POA: Diagnosis not present

## 2019-01-06 DIAGNOSIS — I129 Hypertensive chronic kidney disease with stage 1 through stage 4 chronic kidney disease, or unspecified chronic kidney disease: Secondary | ICD-10-CM | POA: Diagnosis not present

## 2019-01-13 DIAGNOSIS — K5090 Crohn's disease, unspecified, without complications: Secondary | ICD-10-CM | POA: Diagnosis not present

## 2019-02-19 DIAGNOSIS — C61 Malignant neoplasm of prostate: Secondary | ICD-10-CM | POA: Diagnosis not present

## 2019-02-19 DIAGNOSIS — R300 Dysuria: Secondary | ICD-10-CM | POA: Diagnosis not present

## 2019-03-11 DIAGNOSIS — Z23 Encounter for immunization: Secondary | ICD-10-CM | POA: Diagnosis not present

## 2019-04-23 IMAGING — CT CT CHEST W/O CM
2 of 3 series · 15 of 36 positions shown, 18 images · non-contrast
Comparison: 07/16/2016

CLINICAL DATA: Restaging lung cancer. History of adenocarcinoma of
the left upper lobe and February 2015 status post left upper lobe
lobectomy. Remote history of prostate cancer status post
prostatectomy. History of thyroid cancer in 2333 with thyroidectomy.

EXAM:
CT CHEST WITHOUT CONTRAST
TECHNIQUE: Multidetector CT imaging of the chest was performed following the
standard protocol without IV contrast.

[Series 2: thorax · axial · 0.86mm/px · z∈[+80,+358]mm · 12 of 165 slices shown, 15 images]
[im 13/165  mediastinal]
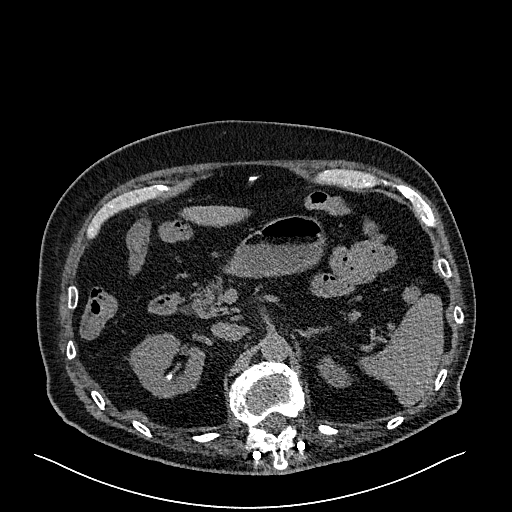
[im 13/165  lung]
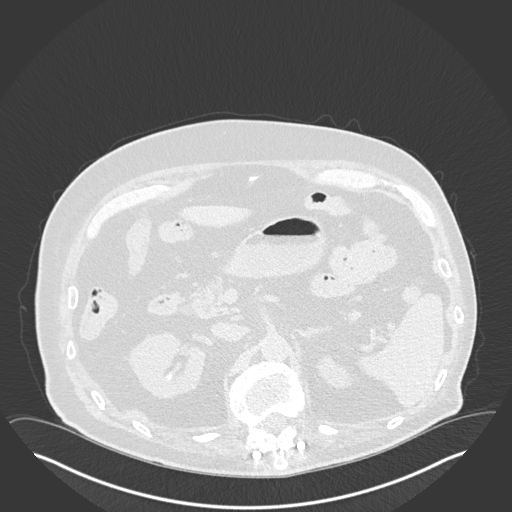
[im 25/165  lung]
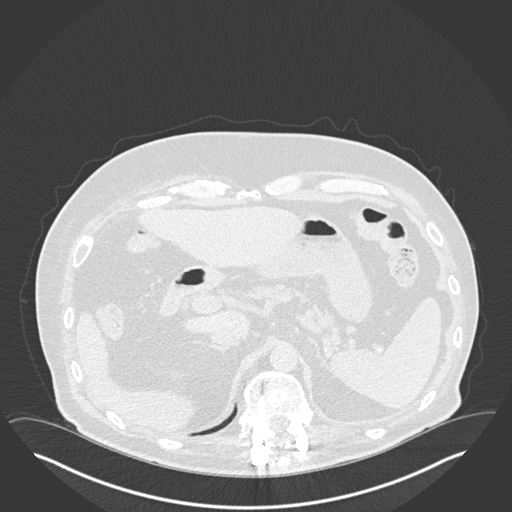
[im 37/165  lung]
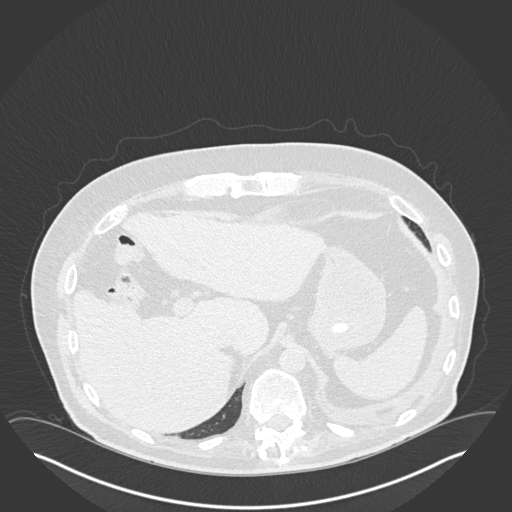
[im 49/165  lung]
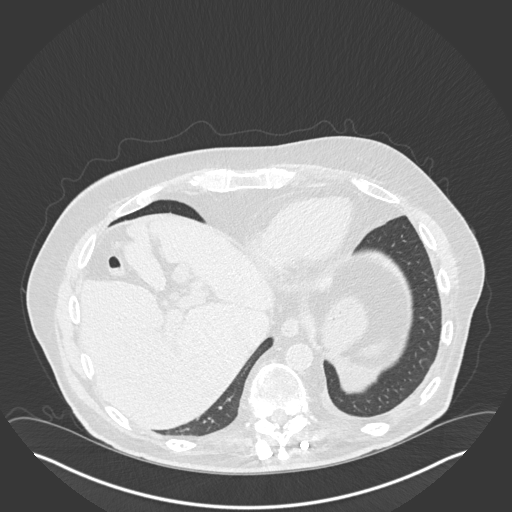
[im 61/165  mediastinal]
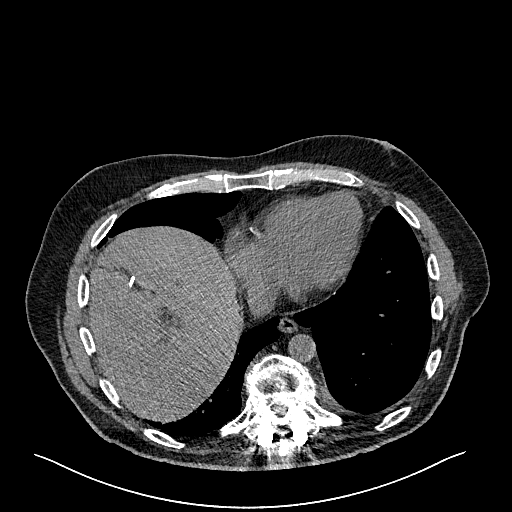
[im 61/165  lung]
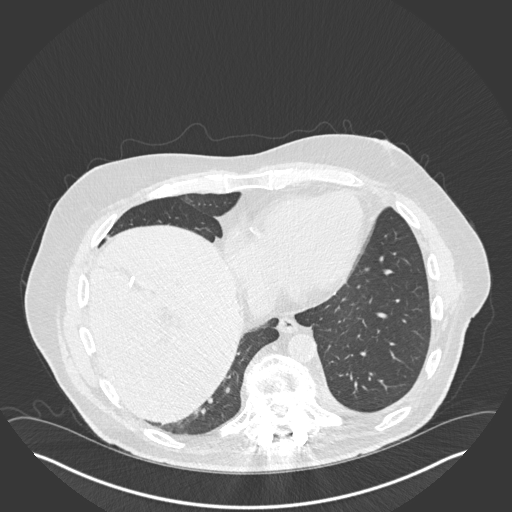
[im 73/165  lung]
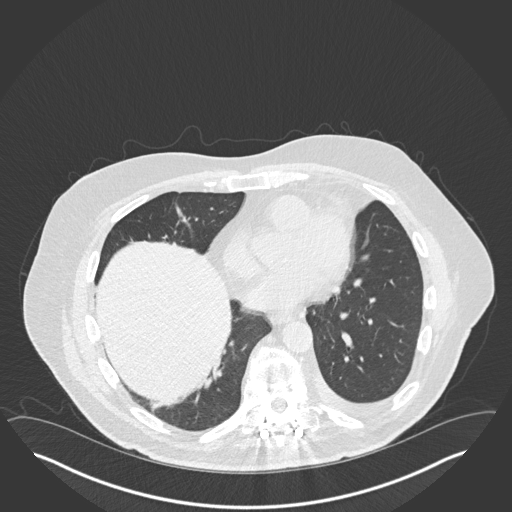
[im 92/165  lung]
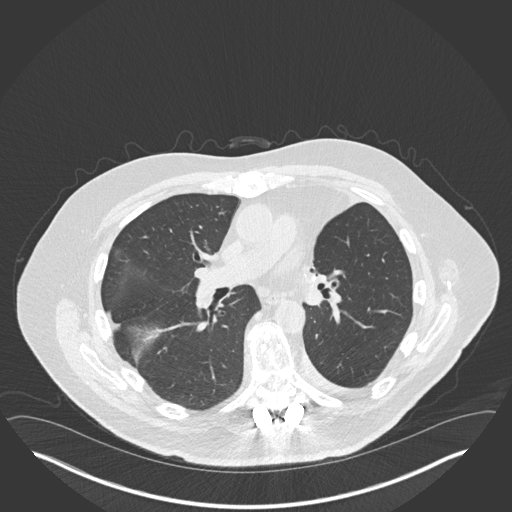
[im 104/165  lung]
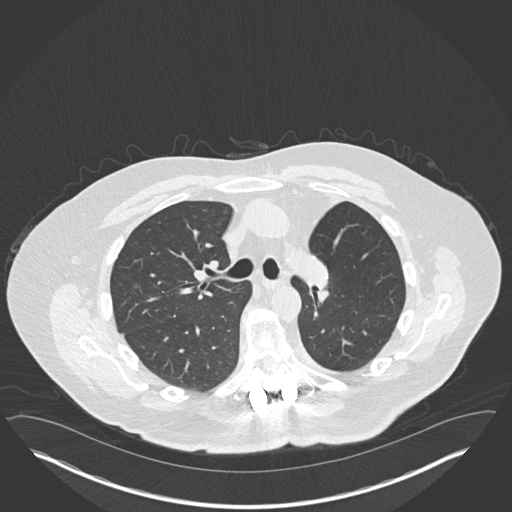
[im 116/165  mediastinal]
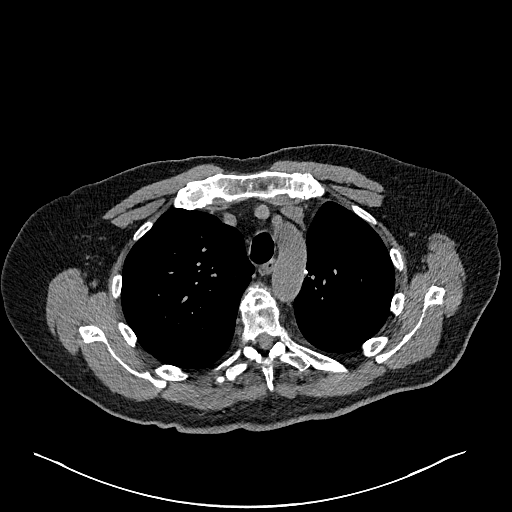
[im 116/165  lung]
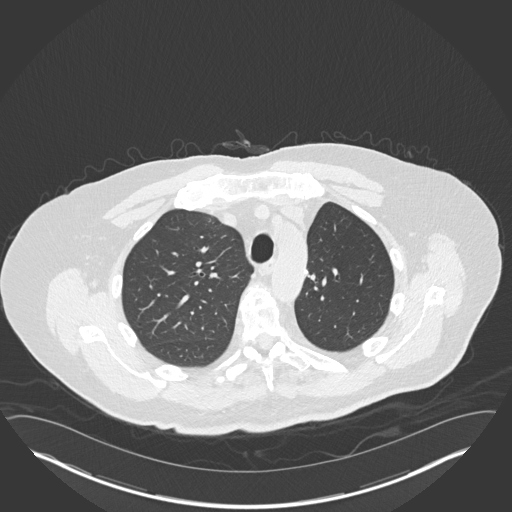
[im 128/165  lung]
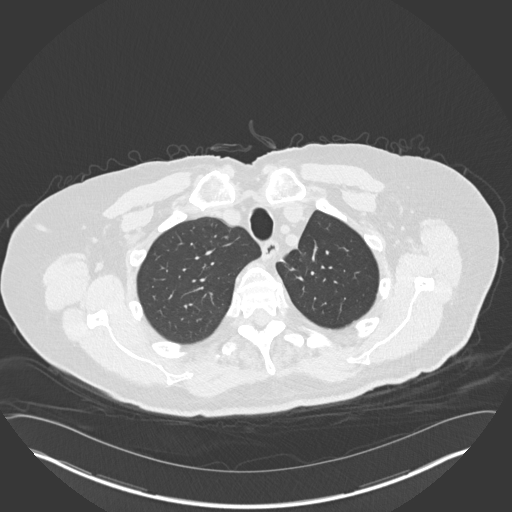
[im 140/165  lung]
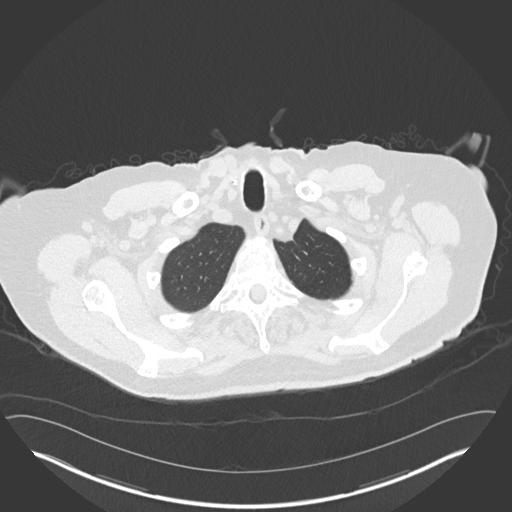
[im 152/165  lung]
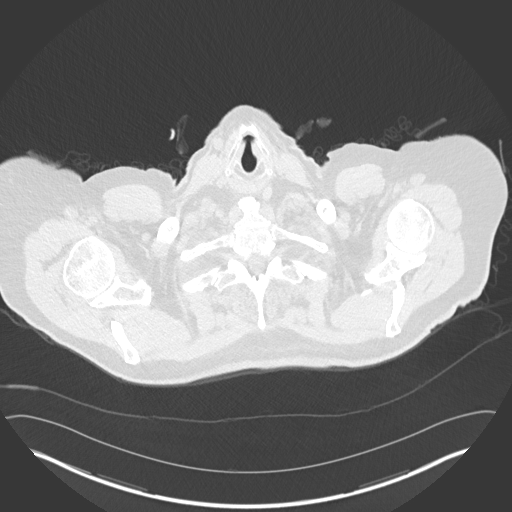

[Series 6: coronal · coronal · 0.64mm/px · 3 of 146 slices shown]
[im 30/146  lung]
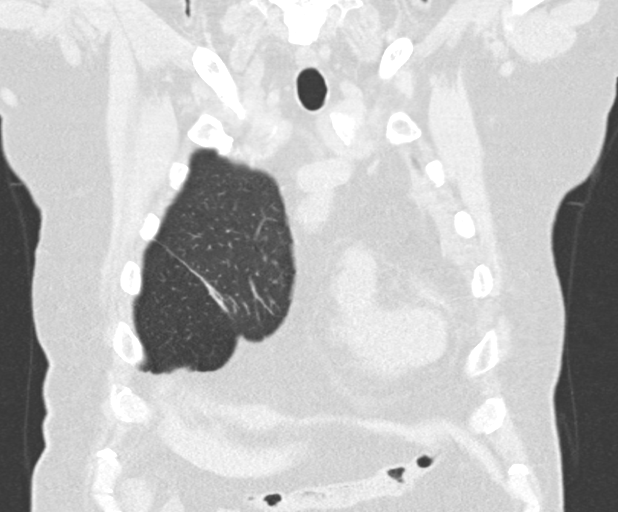
[im 59/146  lung]
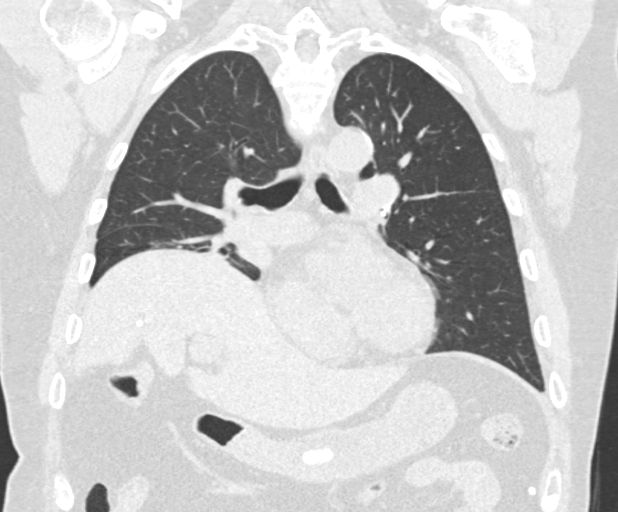
[im 88/146  lung]
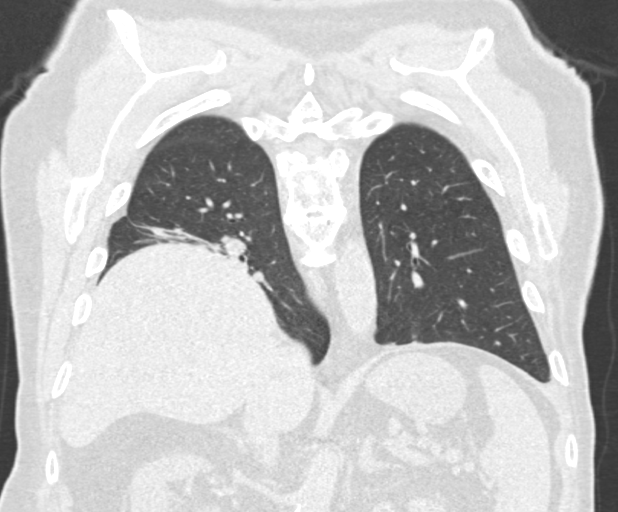

[15 of 36 positions shown; findings below may reference images not displayed]

FINDINGS: Cardiovascular: The heart is normal in size. No pericardial
effusion. Mild tortuosity of the thoracic aorta with minimal
calcifications. Minimal scattered coronary artery calcifications.

Mediastinum/Nodes: Small scattered mediastinal and hilar lymph nodes
but no mass or adenopathy. The esophagus is grossly normal.

Lungs/Pleura: Stable surgical changes from a left upper lobe
lobectomy. No findings for recurrent tumor. No worrisome pulmonary
lesions or pulmonary nodules to suggest metastatic disease. Stable
4.5 mm left apical nodule on image number 32. Stable 3 mm subpleural
nodular density in the left upper lung on image number 48. Stable
right basilar scarring changes and subsegmental atelectasis due to
eventration of the right hemidiaphragm. No pleural effusions or
infiltrates. No pulmonary edema.

Upper Abdomen: No significant findings. No evidence of upper
abdominal metastatic disease. Stable right renal calculus. Status
post cholecystectomy.

Musculoskeletal: Stable thoracolumbar fusion hardware. Stable
compression fracture of T9 which has the appearance of [place]
disease. Compression fracture of L1 also again noted.
IMPRESSION: 1. Status post left upper lobe lobectomy without findings for
recurrent tumor, adenopathy or metastatic disease.
2. Stable small left upper lobe pulmonary nodules.
3. Stable thoracolumbar fusion hardware with compression fractures
of T9 and L1.

Aortic Atherosclerosis ([id number]).

## 2019-05-13 DIAGNOSIS — K5090 Crohn's disease, unspecified, without complications: Secondary | ICD-10-CM | POA: Diagnosis not present

## 2019-05-13 DIAGNOSIS — Z85118 Personal history of other malignant neoplasm of bronchus and lung: Secondary | ICD-10-CM | POA: Diagnosis not present

## 2019-05-13 DIAGNOSIS — I129 Hypertensive chronic kidney disease with stage 1 through stage 4 chronic kidney disease, or unspecified chronic kidney disease: Secondary | ICD-10-CM | POA: Diagnosis not present

## 2019-05-13 DIAGNOSIS — E1122 Type 2 diabetes mellitus with diabetic chronic kidney disease: Secondary | ICD-10-CM | POA: Diagnosis not present

## 2019-05-13 DIAGNOSIS — R5383 Other fatigue: Secondary | ICD-10-CM | POA: Diagnosis not present

## 2019-05-13 DIAGNOSIS — N1832 Chronic kidney disease, stage 3b: Secondary | ICD-10-CM | POA: Diagnosis not present

## 2019-05-13 DIAGNOSIS — C61 Malignant neoplasm of prostate: Secondary | ICD-10-CM | POA: Diagnosis not present

## 2019-05-13 DIAGNOSIS — F39 Unspecified mood [affective] disorder: Secondary | ICD-10-CM | POA: Diagnosis not present

## 2019-05-13 DIAGNOSIS — E039 Hypothyroidism, unspecified: Secondary | ICD-10-CM | POA: Diagnosis not present

## 2019-09-06 ENCOUNTER — Ambulatory Visit (HOSPITAL_COMMUNITY)
Admission: RE | Admit: 2019-09-06 | Discharge: 2019-09-06 | Disposition: A | Discharge status: Home / Self Care | Payer: Medicare Other | Source: Ambulatory Visit | Attending: Internal Medicine | Admitting: Internal Medicine

## 2019-09-06 ENCOUNTER — Other Ambulatory Visit: Payer: Self-pay

## 2019-09-06 ENCOUNTER — Inpatient Hospital Stay: Payer: Medicare Other | Attending: Internal Medicine

## 2019-09-06 DIAGNOSIS — Z85828 Personal history of other malignant neoplasm of skin: Secondary | ICD-10-CM | POA: Diagnosis not present

## 2019-09-06 DIAGNOSIS — Z902 Acquired absence of lung [part of]: Secondary | ICD-10-CM | POA: Diagnosis not present

## 2019-09-06 DIAGNOSIS — Z85118 Personal history of other malignant neoplasm of bronchus and lung: Secondary | ICD-10-CM | POA: Diagnosis present

## 2019-09-06 DIAGNOSIS — E119 Type 2 diabetes mellitus without complications: Secondary | ICD-10-CM | POA: Diagnosis not present

## 2019-09-06 DIAGNOSIS — Z79899 Other long term (current) drug therapy: Secondary | ICD-10-CM | POA: Insufficient documentation

## 2019-09-06 DIAGNOSIS — Z9079 Acquired absence of other genital organ(s): Secondary | ICD-10-CM | POA: Insufficient documentation

## 2019-09-06 DIAGNOSIS — Z85850 Personal history of malignant neoplasm of thyroid: Secondary | ICD-10-CM | POA: Insufficient documentation

## 2019-09-06 DIAGNOSIS — K5090 Crohn's disease, unspecified, without complications: Secondary | ICD-10-CM | POA: Diagnosis not present

## 2019-09-06 DIAGNOSIS — I10 Essential (primary) hypertension: Secondary | ICD-10-CM | POA: Diagnosis not present

## 2019-09-06 DIAGNOSIS — E039 Hypothyroidism, unspecified: Secondary | ICD-10-CM | POA: Diagnosis not present

## 2019-09-06 DIAGNOSIS — C3490 Malignant neoplasm of unspecified part of unspecified bronchus or lung: Secondary | ICD-10-CM | POA: Diagnosis not present

## 2019-09-06 DIAGNOSIS — Z794 Long term (current) use of insulin: Secondary | ICD-10-CM | POA: Diagnosis not present

## 2019-09-06 DIAGNOSIS — E669 Obesity, unspecified: Secondary | ICD-10-CM | POA: Diagnosis not present

## 2019-09-06 DIAGNOSIS — Z8546 Personal history of malignant neoplasm of prostate: Secondary | ICD-10-CM | POA: Diagnosis not present

## 2019-09-06 LAB — CMP (CANCER CENTER ONLY)
ALT: 20 U/L (ref 0–44)
AST: 25 U/L (ref 15–41)
Albumin: 3.5 g/dL (ref 3.5–5.0)
Alkaline Phosphatase: 95 U/L (ref 38–126)
Anion gap: 9 (ref 5–15)
BUN: 22 mg/dL (ref 8–23)
CO2: 29 mmol/L (ref 22–32)
Calcium: 9.4 mg/dL (ref 8.9–10.3)
Chloride: 102 mmol/L (ref 98–111)
Creatinine: 1.39 mg/dL — ABNORMAL HIGH (ref 0.61–1.24)
GFR, Est AFR Am: 57 mL/min — ABNORMAL LOW (ref >60)
GFR, Estimated: 49 mL/min — ABNORMAL LOW (ref >60)
Glucose, Bld: 133 mg/dL — ABNORMAL HIGH (ref 70–99)
Potassium: 4.8 mmol/L (ref 3.5–5.1)
Sodium: 140 mmol/L (ref 135–145)
Total Bilirubin: 0.4 mg/dL (ref 0.3–1.2)
Total Protein: 6.9 g/dL (ref 6.5–8.1)

## 2019-09-06 LAB — CBC WITH DIFFERENTIAL (CANCER CENTER ONLY)
Abs Immature Granulocytes: 0.03 10*3/uL (ref 0.00–0.07)
Basophils Absolute: 0 10*3/uL (ref 0.0–0.1)
Basophils Relative: 0 %
Eosinophils Absolute: 0.1 10*3/uL (ref 0.0–0.5)
Eosinophils Relative: 1 %
HCT: 40.7 % (ref 39.0–52.0)
Hemoglobin: 12.9 g/dL — ABNORMAL LOW (ref 13.0–17.0)
Immature Granulocytes: 0 %
Lymphocytes Relative: 17 %
Lymphs Abs: 1.7 10*3/uL (ref 0.7–4.0)
MCH: 28.7 pg (ref 26.0–34.0)
MCHC: 31.7 g/dL (ref 30.0–36.0)
MCV: 90.4 fL (ref 80.0–100.0)
Monocytes Absolute: 0.7 10*3/uL (ref 0.1–1.0)
Monocytes Relative: 7 %
Neutro Abs: 7.6 10*3/uL (ref 1.7–7.7)
Neutrophils Relative %: 75 %
Platelet Count: 261 10*3/uL (ref 150–400)
RBC: 4.5 MIL/uL (ref 4.22–5.81)
RDW: 13.6 % (ref 11.5–15.5)
WBC Count: 10.3 10*3/uL (ref 4.0–10.5)
nRBC: 0 % (ref 0.0–0.2)

## 2019-09-06 MED ORDER — IOHEXOL 300 MG/ML  SOLN
75.0000 mL | Freq: Once | INTRAMUSCULAR | Status: AC | PRN
  Administered 2019-09-06: 75 mL via INTRAVENOUS

## 2019-09-06 MED ORDER — SODIUM CHLORIDE (PF) 0.9 % IJ SOLN
INTRAMUSCULAR | Status: AC
  Filled 2019-09-06: qty 50

## 2019-09-08 ENCOUNTER — Telehealth: Payer: Self-pay | Admitting: Internal Medicine

## 2019-09-08 ENCOUNTER — Inpatient Hospital Stay: Payer: Medicare Other | Admitting: Internal Medicine

## 2019-09-08 ENCOUNTER — Other Ambulatory Visit: Payer: Self-pay

## 2019-09-08 ENCOUNTER — Encounter: Payer: Self-pay | Admitting: Internal Medicine

## 2019-09-08 VITALS — BP 143/83 | HR 82 | Temp 98.0°F | Resp 20 | Ht 68.5 in | Wt 194.2 lb

## 2019-09-08 DIAGNOSIS — C3492 Malignant neoplasm of unspecified part of left bronchus or lung: Secondary | ICD-10-CM

## 2019-09-08 DIAGNOSIS — C3490 Malignant neoplasm of unspecified part of unspecified bronchus or lung: Secondary | ICD-10-CM

## 2019-09-08 DIAGNOSIS — Z85118 Personal history of other malignant neoplasm of bronchus and lung: Secondary | ICD-10-CM | POA: Diagnosis not present

## 2019-09-08 NOTE — Telephone Encounter (Signed)
Scheduled per los. Gave avs and calendar  

## 2019-09-08 NOTE — Progress Notes (Signed)
[facility] [phone]   [phone]  OFFICE PROGRESS NOTE  [place], MD [place] [address]  DIAGNOSIS: Stage IA (T1b, N0, M0) non-small cell lung cancer, adenocarcinoma presenting with left upper lobe pulmonary nodule diagnosed in September 2016.  PRIOR THERAPY: Status post left VATS with left upper lobectomy and mediastinal lymph node dissection under the care of Dr. Prescott Gum on 03/16/2015.  CURRENT THERAPY: Observation.  INTERVAL HISTORY: Devin [name] 79 y.o. male returns to the clinic today for follow-up visit.  The patient is feeling fine today with no concerning complaints.  He has no changes in his condition since the last visit.  He denied having any chest pain, shortness of breath, cough or hemoptysis.  He denied having any fever or chills.  He has no nausea, vomiting, diarrhea or constipation.  He has no headache or visual changes.  He had repeat CT scan of the chest performed recently and is here for evaluation and discussion of his discuss results.  MEDICAL HISTORY: Past Medical History:  Diagnosis Date  . Adenocarcinoma of left lung, stage 1 ([place]) 03/10/2015  . Ankylosing spondylitis ([place])    Diagnosed during lumbar fracture summer of 2016    . Crohn's disease ([place])   . Gout   . HIstory of basal cell cancer of face    THYROID CA HX  . Hypertension   . Hypocalcemia 04/13/2007  . Hypothyroidism   . Impotence   . 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 Obesity (BMI 30-39.9)   . Osteoporosis    Pt completed 5 years of fosamax in 2013     . Prostate cancer with recurrence    Treated with prostatectomy with recurrence 2012 with Lupron treatment now him   . Psoriasis   . Thyroid cancer ([place] [facility]) 1999   Treated with RAI and total thyroidectomy   . Type II diabetes mellitus ([facility])     ALLERGIES:  has No Known Allergies.  MEDICATIONS:  Current Outpatient Medications   Medication Sig Dispense Refill  . acetaminophen (TYLENOL) 500 MG tablet Take 1,000 mg by mouth 2 (two) times daily.    Marland Kitchen ALPRAZolam (XANAX) 0.5 MG tablet TK 1 T PO Q 6 H PRN  0  . ASACOL HD 800 MG TBEC Take 1,600 mg by mouth 2 (two) times daily.  3  . atenolol (TENORMIN) 25 MG tablet Take 0.5 mg by mouth.    . B-D ULTRAFINE III SHORT PEN 31G X 8 MM MISC USE 1 PEN NEEDLE FOUR TIMES DAILY. 100 each 0  . busPIRone (BUSPAR) 15 MG tablet Take 15 mg by mouth.    . calcium carbonate (OS-CAL) 1250 (500 CA) MG chewable tablet Chew 1 tablet by mouth 2 (two) times daily.    . cefdinir (OMNICEF) 300 MG capsule TK ONE C PO BID FOR 10 DAYS  0  . cholestyramine light (PREVALITE) 4 g packet Take by mouth.    . fluorouracil (EFUDEX) 5 % cream APP ON SKIN BID FOR 2 WEEKS  2  . Glucose Blood (ASCENSIA CONTOUR TEST VI) Use as directed to test two times a day     . glucose blood (BAYER CONTOUR TEST) test strip TEST BLOOD SUGAR TWICE DAILY AS DIRECTED. 200 each 2  . glucose blood ([employer] TEST STRIPS) test strip 1 each by Other route 2 times daily.    . insulin lispro (HUMALOG KWIKPEN) 100 UNIT/ML KiwkPen INJECT UNDER THE  SKIN THREE TIMES DAILY( 25 UNITS, 25 UNITS AND 20 UNITS RESPECTIVELY BEFORE MEALS) 30 mL 5  . insulin lispro (HUMALOG) 100 UNIT/ML KiwkPen Inject into the skin.    . Insulin Pen Needle (FIFTY50 PEN NEEDLES) 31G X 8 MM MISC Inject 31 g into the skin.    Marland Kitchen leuprolide (LUPRON) 11.25 MG injection Inject 11.25 mg into the muscle every 6 (six) months.     . levothyroxine (SYNTHROID, LEVOTHROID) 200 MCG tablet TK 1 TP O QD ON AN EMPTY STOMACH WITH A GLASS OF WATER  2  . lisinopril-hydrochlorothiazide (PRINZIDE,ZESTORETIC) 20-12.5 MG tablet Take 1 tablet by mouth daily. Needs office visit for additional refills 90 tablet 0  . Multiple Vitamins-Iron (DAILY-VITE/IRON/BETA-CAROTENE) TABS Take by mouth.    . Multiple Vitamins-Minerals (CENTRUM SILVER) tablet Take 1 tablet by mouth daily.    Marland Kitchen scopolamine  (TRANSDERM-SCOP) 1 MG/3DAYS APP 1 PA BEHIND EAR 1 HOUR BEFORE EMBARACATION AND CHANGE Q 3 DAYS  0  . triamcinolone cream (KENALOG) 0.1 % Apply 1 application topically 2 (two) times daily. 30 g 0   No current facility-administered medications for this visit.    SURGICAL HISTORY:  Past Surgical History:  Procedure Laterality Date  . ABDOMINAL EXPLORATION SURGERY     for small bowel obstruction  . APPENDECTOMY  03/2007  . BACK SURGERY    . BASAL CELL CARCINOMA EXCISION  "several"   "head"  . CARDIAC CATHETERIZATION  03/17/2003  . CHOLECYSTECTOMY N/A 05/07/2016   Procedure: LAPAROSCOPIC CHOLECYSTECTOMY;  Surgeon: Fanny Skates, MD;  Location: [facility];  Service: General;  Laterality: N/A;  . COLON SURGERY  03/2007   Resection of cecum, appendix, terminal ileum (approximately/notes 10/10/2010  . HERNIA REPAIR    . LAPAROSCOPIC CHOLECYSTECTOMY  05/07/2016  . LAPAROSCOPIC LYSIS OF ADHESIONS  05/07/2016  . LAPAROSCOPIC LYSIS OF ADHESIONS N/A 05/07/2016   Procedure: LAPAROSCOPIC LYSIS OF ADHESIONS TIMES ONE HOUR;  Surgeon: Fanny Skates, MD;  Location: [facility];  Service: General;  Laterality: N/A;  . POSTERIOR FUSION THORACIC SPINE  02/08/2016   1. Posterior thoracic arthrodesis T7-T11 utilizing morcellized allograft, 2. Posterior thoracic segmental fixation T7-T11 utilizing nuvasive pedicle screws  . PROSTATECTOMY  06/2001   w/bilateral pelvic lymph nose dissection/notes 10/24/2010  . SPINAL FUSION  12/2014   Open reduction internal fixation of L1 Chance fracture with posterior fusion T10-L4 utilizing morcellized allograft and some local autograft, segmental instrumentation T10-L4 inclusive utilizing nuvasive pedicle screws/notes 12/16/2014  . Stress Cardiolite  02/17/2003  . THOROCOTOMY WITH LOBECTOMY  03/16/2015   Procedure: THOROCOTOMY WITH LOBECTOMY;  Surgeon: Ivin Poot, MD;  Location: [facility];  Service: Thoracic;;  . TONSILLECTOMY    . TOTAL THYROIDECTOMY  1997  . Venous Doppler  05/30/2004   . VENTRAL HERNIA REPAIR  04/14/2008  . VIDEO ASSISTED THORACOSCOPY Left 03/16/2015   Procedure: VIDEO ASSISTED THORACOSCOPY;  Surgeon: Ivin Poot, MD;  Location: [facility] OR;  Service: Thoracic;  Laterality: Left;    REVIEW OF SYSTEMS:  A comprehensive review of systems was negative.   PHYSICAL EXAMINATION: General appearance: alert, cooperative and no distress Head: Normocephalic, without obvious abnormality, atraumatic Neck: no adenopathy, no JVD, supple, symmetrical, trachea midline and thyroid not enlarged, symmetric, no tenderness/mass/nodules Lymph nodes: Cervical, supraclavicular, and axillary nodes normal. Resp: clear to auscultation bilaterally Back: symmetric, no curvature. ROM normal. No CVA tenderness. Cardio: regular rate and rhythm, S1, S2 normal, no murmur, click, rub or gallop GI: soft, non-tender; bowel sounds normal; no masses,  no organomegaly Extremities:  extremities normal, atraumatic, no cyanosis or edema  ECOG PERFORMANCE STATUS: 1 - Symptomatic but completely ambulatory  Blood pressure (!) 143/83, pulse 82, temperature 98 F (36.7 C), temperature source Temporal, resp. rate 20, height 5' 8.5" (1.74 m), weight 194 lb 3.2 oz (88.1 kg), SpO2 98 %.  LABORATORY DATA: Lab Results  Component Value Date   WBC 10.3 09/06/2019   HGB 12.9 (L) 09/06/2019   HCT 40.7 09/06/2019   MCV 90.4 09/06/2019   PLT 261 09/06/2019      Chemistry      Component Value Date/Time   NA 140 09/06/2019 0924   NA 140 02/18/2017 1011   K 4.8 09/06/2019 0924   K 4.5 02/18/2017 1011   CL 102 09/06/2019 0924   CO2 29 09/06/2019 0924   CO2 22 02/18/2017 1011   BUN 22 09/06/2019 0924   BUN 32.1 (H) 02/18/2017 1011   CREATININE 1.39 (H) 09/06/2019 0924   CREATININE 1.9 (H) 02/18/2017 1011      Component Value Date/Time   CALCIUM 9.4 09/06/2019 0924   CALCIUM 9.8 02/18/2017 1011   ALKPHOS 95 09/06/2019 0924   ALKPHOS 102 02/18/2017 1011   AST 25 09/06/2019 0924   AST 35 (H)  02/18/2017 1011   ALT 20 09/06/2019 0924   ALT 35 02/18/2017 1011   BILITOT 0.4 09/06/2019 0924   BILITOT 0.31 02/18/2017 1011       RADIOGRAPHIC STUDIES: CT Chest W Contrast  Result Date: 09/06/2019 CLINICAL DATA:  Lung cancer staging, status post left upper lobectomy 2016 EXAM: CT CHEST WITH CONTRAST TECHNIQUE: Multidetector CT imaging of the chest was performed during intravenous contrast administration. CONTRAST:  40mL OMNIPAQUE IOHEXOL 300 MG/ML  SOLN COMPARISON:  08/31/2018 FINDINGS: Cardiovascular: No significant vascular findings. Normal heart size. No pericardial effusion. Mediastinum/Nodes: No enlarged mediastinal, hilar, or axillary lymph nodes. Thyroid gland, trachea, and esophagus demonstrate no significant findings. Lungs/Pleura: Redemonstrated postoperative findings of left upper lobectomy. Unchanged small, loculated left pleural effusion. Bandlike scarring and/or partial atelectasis of the right lung base, unchanged. Stable, benign 4-5 mm pulmonary nodule in the superior segment left lower lobe (series 7, image 22). Upper Abdomen: No acute abnormality. Musculoskeletal: No chest wall mass or suspicious bone lesions identified. IMPRESSION: 1. Redemonstrated postoperative findings of left upper lobectomy. No evidence of malignant recurrence. 2. Stable, benign 4-5 mm pulmonary nodule in the superior segment left lower lobe (series 7, image 22). 3.  Unchanged small loculated left pleural effusion. Electronically Signed   By: Eddie Candle M.D.   On: 09/06/2019 12:01    ASSESSMENT AND PLAN:  This is a very pleasant 79 years old white male with a stage Ia non-small cell lung cancer diagnosed in September 2016 status post left upper lobectomy and has been observation since that time. The patient is currently on observation and has been doing fine with no concerning issues. He had repeat CT scan of the chest performed recently.  I personally and independently reviewed the scans and discussed  the results with the patient today. His scan showed no concerning findings for disease recurrence or progression. I recommended for the patient to continue on observation with repeat CT scan of the chest in 1 year. For the hypertension, he was advised to monitor his blood pressure closely at home. The patient was advised to call immediately if he has any other concerning symptoms in the interval. The patient voices understanding of current disease status and treatment options and is in agreement with the current care  plan. All questions were answered. The patient knows to call the clinic with any problems, questions or concerns. We can certainly see the patient much sooner if necessary.  Disclaimer: This note was dictated with voice recognition software. Similar sounding words can inadvertently be transcribed and may not be corrected upon review.

## 2020-01-04 ENCOUNTER — Other Ambulatory Visit: Payer: Self-pay

## 2020-01-04 ENCOUNTER — Telehealth: Payer: Self-pay | Admitting: Interventional Cardiology

## 2020-01-04 ENCOUNTER — Observation Stay (HOSPITAL_COMMUNITY)
Admission: EM | Admit: 2020-01-04 | Discharge: 2020-01-06 | Disposition: A | Discharge status: Home / Self Care | Payer: Medicare Other | Attending: Internal Medicine | Admitting: Internal Medicine

## 2020-01-04 ENCOUNTER — Emergency Department (HOSPITAL_COMMUNITY): Payer: Medicare Other

## 2020-01-04 ENCOUNTER — Encounter (HOSPITAL_COMMUNITY): Payer: Self-pay | Admitting: Emergency Medicine

## 2020-01-04 ENCOUNTER — Observation Stay (HOSPITAL_COMMUNITY): Payer: Medicare Other

## 2020-01-04 DIAGNOSIS — Z794 Long term (current) use of insulin: Secondary | ICD-10-CM | POA: Insufficient documentation

## 2020-01-04 DIAGNOSIS — R911 Solitary pulmonary nodule: Secondary | ICD-10-CM | POA: Diagnosis present

## 2020-01-04 DIAGNOSIS — I160 Hypertensive urgency: Secondary | ICD-10-CM | POA: Insufficient documentation

## 2020-01-04 DIAGNOSIS — Z20822 Contact with and (suspected) exposure to covid-19: Secondary | ICD-10-CM | POA: Diagnosis not present

## 2020-01-04 DIAGNOSIS — E1069 Type 1 diabetes mellitus with other specified complication: Secondary | ICD-10-CM

## 2020-01-04 DIAGNOSIS — C7989 Secondary malignant neoplasm of other specified sites: Secondary | ICD-10-CM | POA: Diagnosis present

## 2020-01-04 DIAGNOSIS — C3490 Malignant neoplasm of unspecified part of unspecified bronchus or lung: Secondary | ICD-10-CM | POA: Insufficient documentation

## 2020-01-04 DIAGNOSIS — R609 Edema, unspecified: Secondary | ICD-10-CM

## 2020-01-04 DIAGNOSIS — C61 Malignant neoplasm of prostate: Secondary | ICD-10-CM | POA: Diagnosis present

## 2020-01-04 DIAGNOSIS — E039 Hypothyroidism, unspecified: Secondary | ICD-10-CM | POA: Insufficient documentation

## 2020-01-04 DIAGNOSIS — K5090 Crohn's disease, unspecified, without complications: Secondary | ICD-10-CM | POA: Insufficient documentation

## 2020-01-04 DIAGNOSIS — R0602 Shortness of breath: Secondary | ICD-10-CM

## 2020-01-04 DIAGNOSIS — C3492 Malignant neoplasm of unspecified part of left bronchus or lung: Secondary | ICD-10-CM | POA: Diagnosis not present

## 2020-01-04 DIAGNOSIS — E1122 Type 2 diabetes mellitus with diabetic chronic kidney disease: Secondary | ICD-10-CM | POA: Diagnosis not present

## 2020-01-04 DIAGNOSIS — I21A1 Myocardial infarction type 2: Secondary | ICD-10-CM | POA: Diagnosis not present

## 2020-01-04 DIAGNOSIS — Z79899 Other long term (current) drug therapy: Secondary | ICD-10-CM | POA: Diagnosis not present

## 2020-01-04 DIAGNOSIS — R0902 Hypoxemia: Secondary | ICD-10-CM | POA: Diagnosis not present

## 2020-01-04 DIAGNOSIS — M7989 Other specified soft tissue disorders: Secondary | ICD-10-CM | POA: Diagnosis not present

## 2020-01-04 DIAGNOSIS — C3412 Malignant neoplasm of upper lobe, left bronchus or lung: Secondary | ICD-10-CM | POA: Diagnosis present

## 2020-01-04 DIAGNOSIS — I10 Essential (primary) hypertension: Secondary | ICD-10-CM | POA: Diagnosis present

## 2020-01-04 DIAGNOSIS — E119 Type 2 diabetes mellitus without complications: Secondary | ICD-10-CM | POA: Insufficient documentation

## 2020-01-04 DIAGNOSIS — I214 Non-ST elevation (NSTEMI) myocardial infarction: Secondary | ICD-10-CM | POA: Diagnosis present

## 2020-01-04 DIAGNOSIS — R778 Other specified abnormalities of plasma proteins: Secondary | ICD-10-CM

## 2020-01-04 DIAGNOSIS — E785 Hyperlipidemia, unspecified: Secondary | ICD-10-CM | POA: Diagnosis present

## 2020-01-04 DIAGNOSIS — E1129 Type 2 diabetes mellitus with other diabetic kidney complication: Secondary | ICD-10-CM | POA: Diagnosis present

## 2020-01-04 LAB — CBC WITH DIFFERENTIAL/PLATELET
Abs Immature Granulocytes: 0.02 10*3/uL (ref 0.00–0.07)
Basophils Absolute: 0 10*3/uL (ref 0.0–0.1)
Basophils Relative: 0 %
Eosinophils Absolute: 0 10*3/uL (ref 0.0–0.5)
Eosinophils Relative: 0 %
HCT: 44.5 % (ref 39.0–52.0)
Hemoglobin: 14 g/dL (ref 13.0–17.0)
Immature Granulocytes: 0 %
Lymphocytes Relative: 12 %
Lymphs Abs: 1.1 10*3/uL (ref 0.7–4.0)
MCH: 28.9 pg (ref 26.0–34.0)
MCHC: 31.5 g/dL (ref 30.0–36.0)
MCV: 91.9 fL (ref 80.0–100.0)
Monocytes Absolute: 0.5 10*3/uL (ref 0.1–1.0)
Monocytes Relative: 6 %
Neutro Abs: 7.9 10*3/uL — ABNORMAL HIGH (ref 1.7–7.7)
Neutrophils Relative %: 82 %
Platelets: 246 10*3/uL (ref 150–400)
RBC: 4.84 MIL/uL (ref 4.22–5.81)
RDW: 13.4 % (ref 11.5–15.5)
WBC: 9.6 10*3/uL (ref 4.0–10.5)
nRBC: 0 % (ref 0.0–0.2)

## 2020-01-04 LAB — TROPONIN I (HIGH SENSITIVITY)
Troponin I (High Sensitivity): 209 ng/L
Troponin I (High Sensitivity): 299 ng/L (ref <18)
Troponin I (High Sensitivity): 173 ng/L (ref <18)
Troponin I (High Sensitivity): 341 ng/L (ref <18)

## 2020-01-04 LAB — BASIC METABOLIC PANEL
Anion gap: 11 (ref 5–15)
BUN: 25 mg/dL — ABNORMAL HIGH (ref 8–23)
CO2: 34 mmol/L — ABNORMAL HIGH (ref 22–32)
Calcium: 9.2 mg/dL (ref 8.9–10.3)
Chloride: 99 mmol/L (ref 98–111)
Creatinine, Ser: 1.25 mg/dL — ABNORMAL HIGH (ref 0.61–1.24)
GFR calc Af Amer: >60 mL/min (ref >60)
GFR calc non Af Amer: 56 mL/min — ABNORMAL LOW (ref >60)
Glucose, Bld: 107 mg/dL — ABNORMAL HIGH (ref 70–99)
Potassium: 4.2 mmol/L (ref 3.5–5.1)
Sodium: 144 mmol/L (ref 135–145)

## 2020-01-04 LAB — SARS CORONAVIRUS 2 BY RT PCR (HOSPITAL ORDER, PERFORMED IN ~~LOC~~ HOSPITAL LAB): SARS Coronavirus 2: NEGATIVE

## 2020-01-04 LAB — GLUCOSE, CAPILLARY: Glucose-Capillary: 105 mg/dL — ABNORMAL HIGH (ref 70–99)

## 2020-01-04 LAB — D-DIMER, QUANTITATIVE: D-Dimer, Quant: 1 ug/mL-FEU — ABNORMAL HIGH (ref 0.00–0.50)

## 2020-01-04 LAB — BRAIN NATRIURETIC PEPTIDE: B Natriuretic Peptide: 125.3 pg/mL — ABNORMAL HIGH (ref 0.0–100.0)

## 2020-01-04 LAB — CBG MONITORING, ED: Glucose-Capillary: 125 mg/dL — ABNORMAL HIGH (ref 70–99)

## 2020-01-04 MED ORDER — SODIUM CHLORIDE (PF) 0.9 % IJ SOLN
INTRAMUSCULAR | Status: AC
  Filled 2020-01-04: qty 50

## 2020-01-04 MED ORDER — INSULIN ASPART 100 UNIT/ML ~~LOC~~ SOLN
0.0000 [IU] | SUBCUTANEOUS | Status: DC
  Administered 2020-01-04: 1 [IU] via SUBCUTANEOUS
  Administered 2020-01-05: 2 [IU] via SUBCUTANEOUS
  Filled 2020-01-04: qty 0.09

## 2020-01-04 MED ORDER — IOHEXOL 350 MG/ML SOLN
100.0000 mL | Freq: Once | INTRAVENOUS | Status: AC | PRN
  Administered 2020-01-04: 100 mL via INTRAVENOUS

## 2020-01-04 MED ORDER — LABETALOL HCL 5 MG/ML IV SOLN
10.0000 mg | Freq: Once | INTRAVENOUS | Status: AC
  Administered 2020-01-04: 10 mg via INTRAVENOUS
  Filled 2020-01-04: qty 4

## 2020-01-04 NOTE — ED Provider Notes (Addendum)
[facility] DEPT Provider Note   CSN: [id number] Arrival date & time: 01/04/20  1213     History Chief Complaint  Patient presents with  . Shortness of Breath    Devin [name] is a 79 y.o. male.  Presents to ER with concern for shortness of breath.  Patient reports over the last couple weeks he has been having increased shortness of breath.  States that he was diagnosed with pneumonia by his primary doctor, he completed 1 course of antibiotics but was continue to have symptoms so his primary doctor prescribed a second course of antibiotics.  Reports that he has almost finished his second course of antibiotics.  Has history of lung cancer, currently in remission, not on any chemotherapy.  No associated chest pain, no fevers, no coughing.  HPI     Past Medical History:  Diagnosis Date  . 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 Ankylosing spondylitis ([place])    Diagnosed during lumbar fracture summer of 2016    . Crohn's disease ([place])   . Gout   . HIstory of basal cell cancer of face    THYROID CA HX  . Hypertension   . Hypocalcemia 04/13/2007  . Hypothyroidism   . Impotence   . 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 Obesity (BMI 30-39.9)   . Osteoporosis    Pt completed 5 years of fosamax in 2013     . Prostate cancer with recurrence    Treated with prostatectomy with recurrence 2012 with Lupron treatment now him   . Psoriasis   . Thyroid cancer ([place]) 1999   Treated with RAI and total thyroidectomy   . Type II diabetes mellitus [facility])     Patient Active Problem List   Diagnosis Date Noted  . Crohn's ileocolitis ([place]) 02/21/2017  . Hyperlipidemia 02/21/2017  . Hyperuricemia 02/21/2017  . Hypothyroid 02/21/2017  . 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 Thyroid cancer ([place]) 02/21/2017  . Type 1 diabetes mellitus ([place]) 02/21/2017  .  Cholecystitis with cholelithiasis 05/07/2016  . Cholecystitis 03/14/2016  . Abdominal pain 03/05/2016  . Malnutrition of moderate degree 02/10/2016  . Thoracic compression fracture ([place]) 02/08/2016  . Back pain 01/26/2016  . Hip pain 01/26/2016  . S/P lobectomy of lung 03/16/2015  . 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 Diabetic neuropathy ([place])   . Ankylosing spondylitis ([place])   . Obesity (BMI 30-39.9)   . Crohn's disease ([place])   . Lung nodule 02/23/2015  . HIstory of basal cell cancer of face   . History of thyroid cancer   . Postsurgical hypothyroidism   . Gout 12/29/2006  . Essential hypertension 12/29/2006  . Osteoporosis 12/29/2006  . 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 Prostate cancer with recurrence     Past Surgical History:  Procedure Laterality Date  . ABDOMINAL EXPLORATION SURGERY     for small bowel obstruction  . APPENDECTOMY  03/2007  . BACK SURGERY    . BASAL CELL CARCINOMA EXCISION  "several"   "head"  . CARDIAC CATHETERIZATION  03/17/2003  . CHOLECYSTECTOMY N/A 05/07/2016   Procedure: LAPAROSCOPIC CHOLECYSTECTOMY;  Surgeon: Fanny Skates, MD;  Location: [facility];  Service: General;  Laterality: N/A;  . COLON SURGERY  03/2007   Resection of cecum, appendix, terminal ileum (approximately/notes 10/10/2010  . HERNIA REPAIR    . LAPAROSCOPIC CHOLECYSTECTOMY  05/07/2016  . LAPAROSCOPIC LYSIS OF ADHESIONS  05/07/2016  . LAPAROSCOPIC LYSIS OF ADHESIONS N/A 05/07/2016   Procedure: LAPAROSCOPIC LYSIS OF ADHESIONS TIMES ONE HOUR;  Surgeon: Fanny Skates, MD;  Location: [facility];  Service: General;  Laterality: N/A;  . POSTERIOR FUSION THORACIC SPINE  02/08/2016   1. Posterior thoracic arthrodesis T7-T11 utilizing morcellized allograft, 2. Posterior thoracic segmental fixation T7-T11 utilizing nuvasive pedicle screws  . PROSTATECTOMY  06/2001   w/bilateral pelvic lymph nose dissection/notes 10/24/2010  . SPINAL FUSION   12/2014   Open reduction internal fixation of L1 Chance fracture with posterior fusion T10-L4 utilizing morcellized allograft and some local autograft, segmental instrumentation T10-L4 inclusive utilizing nuvasive pedicle screws/notes 12/16/2014  . Stress Cardiolite  02/17/2003  . THOROCOTOMY WITH LOBECTOMY  03/16/2015   Procedure: THOROCOTOMY WITH LOBECTOMY;  Surgeon: Ivin Poot, MD;  Location: [facility];  Service: Thoracic;;  . TONSILLECTOMY    . TOTAL THYROIDECTOMY  1997  . Venous Doppler  05/30/2004  . VENTRAL HERNIA REPAIR  04/14/2008  . VIDEO ASSISTED THORACOSCOPY Left 03/16/2015   Procedure: VIDEO ASSISTED THORACOSCOPY;  Surgeon: Ivin Poot, MD;  Location: [facility] OR;  Service: Thoracic;  Laterality: Left;       Family History  Problem Relation Age of Onset  . Cancer Neg Hx        No cancer in the patient's immediate family, except of course for the patient himself, as noted    Social History   Tobacco Use  . Smoking status: Former Smoker    Packs/day: 2.00    Years: 30.00    Pack years: 60.00    Types: Cigarettes    Quit date: 12/26/1989    Years since quitting: 30.0  . Smokeless tobacco: Never Used  Vaping Use  . Vaping Use: Never used  Substance Use Topics  . Alcohol use: Yes    Alcohol/week: 0.0 standard drinks    Comment: 02/08/2016 "might have a couple drinks/year; might not"  . Drug use: No    Home Medications Prior to Admission medications   Medication Sig Start Date End Date Taking? Authorizing Provider  acetaminophen (TYLENOL) 500 MG tablet Take 1,000 mg by mouth 2 (two) times daily.    [provider]  ALPRAZolam (XANAX) 0.5 MG tablet TK 1 T PO Q 6 H PRN 02/16/18   [provider]  ASACOL HD 800 MG TBEC Take 1,600 mg by mouth 2 (two) times daily. 11/14/14   [provider]  atenolol (TENORMIN) 25 MG tablet Take 0.5 mg by mouth. 05/17/16   [provider]  B-D ULTRAFINE III SHORT PEN 31G X 8 MM MISC USE 1 PEN NEEDLE FOUR TIMES  DAILY. 09/18/16   Ivar Drape D, PA  busPIRone (BUSPAR) 15 MG tablet Take 15 mg by mouth. 05/17/16   [provider]  calcium carbonate (OS-CAL) 1250 (500 CA) MG chewable tablet Chew 1 tablet by mouth 2 (two) times daily.    [provider]  cefdinir (OMNICEF) 300 MG capsule TK ONE C PO BID FOR 10 DAYS 11/24/17   [provider]  cholestyramine light (PREVALITE) 4 g packet Take by mouth. 07/05/16   [provider]  fluorouracil (EFUDEX) 5 % cream APP ON SKIN BID FOR 2 WEEKS 12/24/17   [provider]  Glucose Blood (ASCENSIA CONTOUR TEST VI) Use as directed to test two times a day     [provider]  glucose blood (BAYER CONTOUR TEST) test strip TEST BLOOD SUGAR  TWICE DAILY AS DIRECTED. 05/17/16   Gale Journey, Sarah L, PA-C  glucose blood ([employer] TEST STRIPS) test strip 1 each by Other route 2 times daily. 05/17/16   [provider]  insulin lispro (HUMALOG KWIKPEN) 100 UNIT/ML KiwkPen INJECT UNDER THE SKIN THREE TIMES DAILY( 25 UNITS, 25 UNITS AND 20 UNITS RESPECTIVELY BEFORE MEALS) 05/17/16   Weber, Sarah L, PA-C  insulin lispro (HUMALOG) 100 UNIT/ML KiwkPen Inject into the skin. 05/17/16   [provider]  Insulin Pen Needle (FIFTY50 PEN NEEDLES) 31G X 8 MM MISC Inject 31 g into the skin. 09/18/16   [provider]  leuprolide (LUPRON) 11.25 MG injection Inject 11.25 mg into the muscle every 6 (six) months.     [provider]  levothyroxine (SYNTHROID, LEVOTHROID) 200 MCG tablet TK 1 TP O QD ON AN EMPTY STOMACH WITH A GLASS OF WATER 12/08/17   [provider]  lisinopril-hydrochlorothiazide (PRINZIDE,ZESTORETIC) 20-12.5 MG tablet Take 1 tablet by mouth daily. Needs office visit for additional refills 01/06/17   Gale Journey, Damaris Hippo, PA-C  Multiple Vitamins-Iron (DAILY-VITE/IRON/BETA-CAROTENE) TABS Take by mouth.    [provider]  Multiple Vitamins-Minerals (CENTRUM SILVER) tablet Take 1 tablet by mouth  daily.    [provider]  scopolamine (TRANSDERM-SCOP) 1 MG/3DAYS APP 1 PA BEHIND EAR 1 HOUR BEFORE EMBARACATION AND CHANGE Q 3 DAYS 01/16/18   [provider]  triamcinolone cream (KENALOG) 0.1 % Apply 1 application topically 2 (two) times daily. 03/05/16   Renato Shin, MD    Allergies    Patient has no known allergies.  Review of Systems   Review of Systems  Constitutional: Positive for fatigue. Negative for chills and fever.  HENT: Negative for ear pain and sore throat.   Eyes: Negative for pain and visual disturbance.  Respiratory: Positive for shortness of breath. Negative for cough.   Cardiovascular: Negative for chest pain and palpitations.  Gastrointestinal: Negative for abdominal pain and vomiting.  Genitourinary: Negative for dysuria and hematuria.  Musculoskeletal: Negative for arthralgias and back pain.  Skin: Negative for color change and rash.  Neurological: Negative for seizures and syncope.  All other systems reviewed and are negative.   Physical Exam Updated Vital Signs BP (!) 151/73   Pulse 98   Temp 98.3 F (36.8 C) (Oral)   Resp (!) 33   Ht 5' 10"  (1.778 m)   Wt 81.6 kg   SpO2 94%   BMI 25.83 kg/m   Physical Exam Vitals and nursing note reviewed.  Constitutional:      Appearance: He is well-developed.  HENT:     Head: Normocephalic and atraumatic.  Eyes:     Conjunctiva/sclera: Conjunctivae normal.  Cardiovascular:     Rate and Rhythm: Normal rate and regular rhythm.     Heart sounds: No murmur heard.   Pulmonary:     Effort: No respiratory distress.     Comments: Mild tachypnea but relatively clear breath sounds, no distress, speaks in full sentences Abdominal:     Palpations: Abdomen is soft.     Tenderness: There is no abdominal tenderness.  Musculoskeletal:     Cervical back: Neck supple.  Skin:    General: Skin is warm and dry.  Neurological:     General: No focal deficit present.     Mental Status: He is alert.    Psychiatric:        Mood and Affect: Mood normal.        Behavior: Behavior normal.  ED Results / Procedures / Treatments   Labs (all labs ordered are listed, but only abnormal results are displayed) Labs Reviewed  CBC WITH DIFFERENTIAL/PLATELET - Abnormal; Notable for the following components:      Result Value   Neutro Abs 7.9 (*)    All other components within normal limits  BASIC METABOLIC PANEL - Abnormal; Notable for the following components:   CO2 34 (*)    Glucose, Bld 107 (*)    BUN 25 (*)    Creatinine, Ser 1.25 (*)    GFR calc non Af Amer 56 (*)    All other components within normal limits  BRAIN NATRIURETIC PEPTIDE - Abnormal; Notable for the following components:   B Natriuretic Peptide 125.3 (*)    All other components within normal limits  D-DIMER, QUANTITATIVE (NOT AT [facility]) - Abnormal; Notable for the following components:   D-Dimer, Quant 1.00 (*)    All other components within normal limits  TROPONIN I (HIGH SENSITIVITY) - Abnormal; Notable for the following components:   Troponin I (High Sensitivity) 209 (*)    All other components within normal limits  TROPONIN I (HIGH SENSITIVITY) - Abnormal; Notable for the following components:   Troponin I (High Sensitivity) 299 (*)    All other components within normal limits  SARS CORONAVIRUS 2 BY RT PCR (HOSPITAL ORDER, [facility])    EKG None  Radiology DG Chest 2 View  Result Date: 01/04/2020 CLINICAL DATA:  Shortness of breath for 2 weeks EXAM: CHEST - 2 VIEW COMPARISON:  September 06, 2019, July 02, 2016 FINDINGS: The cardiomediastinal silhouette is unchanged in contour. Unchanged small to moderate LEFT pleural effusion. Surgical clips project over the upper chest. No pneumothorax. LEFT basilar homogeneous opacification, likely atelectasis. RIGHT basilar linear opacities, consistent with atelectasis versus scar. Visualized abdomen is unremarkable. Status post posterior fusion of  the thoracic spine. IMPRESSION: Unchanged small to moderate LEFT pleural effusion with favored associated LEFT basilar atelectasis. Electronically Signed   By: Valentino Saxon MD   On: 01/04/2020 13:21   CT Angio Chest PE W and/or Wo Contrast  Result Date: 01/04/2020 CLINICAL DATA:  79 year old male with shortness of breath. Concern for pulmonary embolism. Elevated D-dimer. EXAM: CT ANGIOGRAPHY CHEST WITH CONTRAST TECHNIQUE: Multidetector CT imaging of the chest was performed using the standard protocol during bolus administration of intravenous contrast. Multiplanar CT image reconstructions and MIPs were obtained to evaluate the vascular anatomy. CONTRAST:  136m OMNIPAQUE IOHEXOL 350 MG/ML SOLN COMPARISON:  Chest CT dated 09/06/2019 and radiograph dated 01/04/2020. FINDINGS: Cardiovascular: There is no cardiomegaly or pericardial effusion. The thoracic aorta is unremarkable. There is no CT evidence of pulmonary embolism. Mediastinum/Nodes: No hilar or mediastinal adenopathy. The esophagus is grossly unremarkable. Thyroidectomy. No mediastinal fluid collection. Lungs/Pleura: There is a small loculated left pleural effusion similar in size to the prior CT. Postsurgical changes of left upper lobectomy. There is a 6 mm nodule in the superior segment of the left lower lobe (21/6) similar to prior CT. No new consolidative changes. No pneumothorax. The central airways are patent. Upper Abdomen: Cholecystectomy. Excreted contrast versus a nonobstructing stone in the upper pole of the right kidney. Musculoskeletal: Osteopenia with degenerative changes of the spine and multilevel posterior fusion hardware. No acute osseous pathology. Review of the MIP images confirms the above findings. IMPRESSION: 1. No acute intrathoracic pathology. No CT evidence of pulmonary embolism. 2. Stable postsurgical changes of left upper lobectomy. 3. Stable small loculated left pleural  effusion. 4. A 6 mm left lower lobe pulmonary  nodule. Attention on follow-up imaging recommended. Electronically Signed   By: Anner Crete M.D.   On: 01/04/2020 16:45    Procedures .Critical Care Performed by: Lucrezia Starch, MD Authorized by: Lucrezia Starch, MD   Critical care provider statement:    Critical care time (minutes):  45   Critical care was time spent personally by me on the following activities:  Discussions with consultants, evaluation of patient's response to treatment, examination of patient, ordering and performing treatments and interventions, ordering and review of laboratory studies, ordering and review of radiographic studies, pulse oximetry, re-evaluation of patient's condition, obtaining history from patient or surrogate and review of old charts   (including critical care time)  Medications Ordered in ED Medications  sodium chloride (PF) 0.9 % injection (has no administration in time range)  iohexol (OMNIPAQUE) 350 MG/ML injection 100 mL (100 mLs Intravenous Contrast Given 01/04/20 1610)    ED Course  I have reviewed the triage vital signs and the nursing notes.  Pertinent labs & imaging results that were available during my care of the patient were reviewed by me and considered in my medical decision making (see chart for details).    MDM Rules/Calculators/A&P                          79 year old male presenting to ER with concern for shortness of breath x2 to 3 weeks.  Previously diagnosed with pneumonia and treated with outpatient antibiotics.  In ER, well-appearing, mild tachypnea but otherwise stable.  Dimer elevated, troponin elevated.  CTA chest negative for acute pulmonary embolism.  No acute ischemic changes on EKG, no chest pain there was initially less concern for ACS but elevated troponin raises concern for possible ACS.  Discussed case with cardiology.  Dr. Acie Fredrickson recommending admission to hospitalist service, someone from their team will see in a.m., stress test in a.m.  Final  Clinical Impression(s) / ED Diagnoses Final diagnoses:  Shortness of breath  Elevated troponin    Rx / DC Orders ED Discharge Orders    None       Lucrezia Starch, MD 01/04/20 1736    Lucrezia Starch, MD 01/04/20 [phone]

## 2020-01-04 NOTE — Telephone Encounter (Signed)
New message  Dr. Roslynn Amble from [facility] ED is calling in to speak with the DOD. Transferred to DOD line.

## 2020-01-04 NOTE — ED Notes (Signed)
Called receiving floor regarding patients pending bed assignment. No assigned nurse as of yet. Will continue to monitor.

## 2020-01-04 NOTE — ED Triage Notes (Signed)
Patient arrived by EMS from home. Patient c/o SOB x 2 weeks. Patient was dx w/ pneumonia and placed on antibiotics.   History of Lung Cancer (2017) per EMS.

## 2020-01-04 NOTE — Telephone Encounter (Signed)
Late entry due to Epic downtime:  Dr. Irish Lack spoke with Dr. Roslynn Amble and was instructed to reach out to Trish at the hospital since the patient is currently in the ER at [facility].

## 2020-01-04 NOTE — H&P (Addendum)
Devin [name] JHE:[id number] DOB: [birth year] DOA: 01/04/2020     PCP: Prince Solian, MD   Outpatient Specialists:       Oncology  Dr. Julien Nordmann GI Dr. Earlean Shawl Urology Dr. Dr. Alinda Money.   Patient arrived to ER on 01/04/20 at 1213 Referred by Attending Gareth Morgan, MD   Patient coming from: home Lives  With family    Chief Complaint:   Chief Complaint  Patient presents with  . Shortness of Breath    HPI: Devin [name] is a 79 y.o. 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 and DM 2     Presented with  SOB for the past 2 weeks prior to this was told by his PCP he has PNA and started on antibiotics he finished Abx but was still SOB    Infectious risk factors:  Reports shortness of breath,  chest pressure    Has  been vaccinated against COVID    Initial COVID TEST  NEGATIVE   Lab Results  Component Value Date   [place] NEGATIVE 01/04/2020     Regarding pertinent Chronic problems:     Hyperlipidemia -  Not on statins Lipid Panel     Component Value Date/Time   CHOL 153 05/08/2015 0904   TRIG 274.0 (H) 05/08/2015 0904   TRIG 151 (H) 03/18/2006 0835   HDL 43.10 05/08/2015 0904   CHOLHDL 4 05/08/2015 0904   VLDL 54.8 (H) 05/08/2015 0904   [place]  04/29/2012 0840     Comment:       Not calculated due to Triglyceride >400. Suggest ordering Direct LDL (Unit Code: [phone]).   Total Cholesterol/HDL Ratio:CHD Risk                        Coronary Heart Disease Risk Table                                        Men       Women          1/2 Average Risk              3.4        3.3              Average Risk              5.0        4.4           2X Average Risk              9.6        7.1           3X Average Risk             23.4       11.0 Use the calculated Patient Ratio above and the CHD Risk  table  to determine the patient's CHD Risk. ATP III Classification (LDL):       < 100        mg/dL         Optimal      100 - 129     mg/dL         Near or Above Optimal      130 - 159  mg/dL         Borderline High      160 - 189     mg/dL         High       > 190        mg/dL         Very High     LDLDIRECT 51.0 05/08/2015 0904     HTN on atenolol, lisinopril/HCTZ was recently taken off  Chron's disease on ASACOL  Prostate CA on Lupron   LUng CA stage 1 - diagnosed in September 2016 status post left upper lobectomy currently on observation    DM 2 -  Lab Results  Component Value Date   HGBA1C 6.5 (H) 04/29/2016   on insulin    Hypothyroidism:  Lab Results  Component Value Date   TSH 1.46 09/05/2015   on synthroid     CKD stage III - baseline Cr 1.4 Estimated Creatinine Clearance: 51.9 mL/min (A) (by C-G formula based on SCr of 1.25 mg/dL (H)).  Lab Results  Component Value Date   CREATININE 1.25 (H) 01/04/2020   CREATININE 1.39 (H) 09/06/2019   CREATININE 1.52 (H) 08/31/2018     While in ER: Noted to have elevated Troponin 219 -299   ER Provider Called:  Cardiology    Dr. Scarlette Calico They Recommend admit to medicine no heparin at this time Will see in AM   Hospitalist was called for admission for NSTEMI vs unstable angina  The following Work up has been ordered so far:  Orders Placed This Encounter  Procedures  . Critical Care  . SARS Coronavirus 2 by RT PCR (hospital order, performed in [facility] hospital lab) Nasopharyngeal Nasopharyngeal Swab  . DG Chest 2 View  . CT Angio Chest PE W and/or Wo Contrast  . NM Myocar Multi W/Spect W/Wall Motion / EF  . CBC with Differential  . Basic metabolic panel  . Brain natriuretic peptide  . D-dimer, quantitative (not at [facility])  . Diet NPO time specified  . Cardiac monitoring  . Check Pulse Oximetry while ambulating  . Inpatient consult to Cardiology  ALL PATIENTS BEING ADMITTED/HAVING PROCEDURES NEED  COVID-19 SCREENING  . Consult to hospitalist  ALL PATIENTS BEING ADMITTED/HAVING PROCEDURES NEED COVID-19 SCREENING  . ED EKG    Following Medications were ordered in ER: Medications  sodium chloride (PF) 0.9 % injection (has no administration in time range)  iohexol (OMNIPAQUE) 350 MG/ML injection 100 mL (100 mLs Intravenous Contrast Given 01/04/20 1610)        Consult Orders  (From admission, onward)         Start     Ordered   01/04/20 1733  Consult to hospitalist  ALL PATIENTS BEING ADMITTED/HAVING PROCEDURES NEED COVID-19 SCREENING  Once       Comments: ALL PATIENTS BEING ADMITTED/HAVING PROCEDURES NEED COVID-19 SCREENING  Provider:  (Not yet assigned)  Question Answer Comment  Place call to: [facility]   Reason for Consult Admit      01/04/20 1732          Significant initial  Findings: Abnormal Labs Reviewed  CBC WITH DIFFERENTIAL/PLATELET - Abnormal; Notable for the following components:      Result Value   Neutro Abs 7.9 (*)    All other components within normal limits  BASIC METABOLIC PANEL - Abnormal; Notable for the following components:   CO2 34 (*)    Glucose, Bld 107 (*)    BUN 25 (*)  Creatinine, Ser 1.25 (*)    GFR calc non Af Amer 56 (*)    All other components within normal limits  BRAIN NATRIURETIC PEPTIDE - Abnormal; Notable for the following components:   B Natriuretic Peptide 125.3 (*)    All other components within normal limits  D-DIMER, QUANTITATIVE (NOT AT [facility]) - Abnormal; Notable for the following components:   D-Dimer, Quant 1.00 (*)    All other components within normal limits  TROPONIN I (HIGH SENSITIVITY) - Abnormal; Notable for the following components:   Troponin I (High Sensitivity) 209 (*)    All other components within normal limits  TROPONIN I (HIGH SENSITIVITY) - Abnormal; Notable for the following components:   Troponin I (High Sensitivity) 299 (*)    All other components within normal limits   Otherwise labs  showing:    Recent Labs  Lab 01/04/20 1411  NA 144  K 4.2  CO2 34*  GLUCOSE 107*  BUN 25*  CREATININE 1.25*  CALCIUM 9.2    Cr    stable,   Lab Results  Component Value Date   CREATININE 1.25 (H) 01/04/2020   CREATININE 1.39 (H) 09/06/2019   CREATININE 1.52 (H) 08/31/2018    No results for input(s): AST, ALT, ALKPHOS, BILITOT, PROT, ALBUMIN in the last 168 hours. Lab Results  Component Value Date   CALCIUM 9.2 01/04/2020    WBC      Component Value Date/Time   WBC 9.6 01/04/2020 1411   ANC    Component Value Date/Time   NEUTROABS 7.9 (H) 01/04/2020 1411   NEUTROABS 7.1 (H) 02/18/2017 1011   ALC No components found for: LYMPHAB    Plt: Lab Results  Component Value Date   PLT 246 01/04/2020    COVID-19 Labs  Recent Labs    01/04/20 1411  DDIMER 1.00*    Lab Results  Component Value Date   SARSCOV2NAA NEGATIVE 01/04/2020    HG/HCT  stable,      Component Value Date/Time   HGB 14.0 01/04/2020 1411   HGB 12.9 (L) 09/06/2019 0924   HGB 12.6 (L) 02/18/2017 1011   HCT 44.5 01/04/2020 1411   HCT 37.3 (L) 02/18/2017 1011    No results for input(s): LIPASE, AMYLASE in the last 168 hours. No results for input(s): AMMONIA in the last 168 hours.     Troponin 219 -299     ECG: Ordered Personally reviewed by me showing: HR : 114 Rhythm:   Sinus tachycardia    nonspecific changes,   QTC 462   BNP (last 3 results) Recent Labs    01/04/20 1411  BNP 125.3*      CBG (last 3)  Recent Labs    01/04/20 2043  GLUCAP 125*     UA not ordered     CXR - stable moderate LEft pleural effusion   CTA chest -  nonacute, no PE,  no evidence of infiltrate 6 mm LLL Pulmonary nodule     ED Triage Vitals  Enc Vitals Group     BP 01/04/20 1223 (!) 145/79     Pulse Rate 01/04/20 1223 (!) 110     Resp 01/04/20 1223 (!) 27     Temp 01/04/20 1223 98.3 F (36.8 C)     Temp Source 01/04/20 1223 Oral     SpO2 01/04/20 1223 97 %     Weight 01/04/20  1223 180 lb (81.6 kg)     Height 01/04/20 1223 5' 10"  (1.778 m)  Head Circumference --      Peak Flow --      Pain Score 01/04/20 1231 2     Pain Loc --      Pain Edu? --      Excl. in [place]? --   TMAX(24)@       Latest  Blood pressure (!) 146/75, pulse 104, temperature 98.3 F (36.8 C), temperature source Oral, resp. rate (!) 27, height 5' 10"  (1.778 m), weight 81.6 kg, SpO2 96 %.    Review of Systems:    Pertinent positives include:  shortness of breath at rest  Constitutional:  No weight loss, night sweats, Fevers, chills, fatigue, weight loss  HEENT:  No headaches, Difficulty swallowing,Tooth/dental problems,Sore throat,  No sneezing, itching, ear ache, nasal congestion, post nasal drip,  Cardio-vascular:  No chest pain, Orthopnea, PND, anasarca, dizziness, palpitations.no Bilateral lower extremity swelling  GI:  No heartburn, indigestion, abdominal pain, nausea, vomiting, diarrhea, change in bowel habits, loss of appetite, melena, blood in stool, hematemesis Resp:  not. No dyspnea on exertion, No excess mucus, no productive cough, No non-productive cough, No coughing up of blood.No change in color of mucus.No wheezing. Skin:  no rash or lesions. No jaundice GU:  no dysuria, change in color of urine, no urgency or frequency. No straining to urinate.  No flank pain.  Musculoskeletal:  No joint pain or no joint swelling. No decreased range of motion. No back pain.  Psych:  No change in mood or affect. No depression or anxiety. No memory loss.  Neuro: no localizing neurological complaints, no tingling, no weakness, no double vision, no gait abnormality, no slurred speech, no confusion  All systems reviewed and apart from [place] all are negative  Past Medical History:   Past Medical History:  Diagnosis Date  . Adenocarcinoma of left lung, stage 1 ([place]) 03/10/2015  . Ankylosing spondylitis ([place])    Diagnosed during lumbar fracture summer of 2016    . Crohn's disease ([place])    . Gout   . HIstory of basal cell cancer of face    THYROID CA HX  . Hypertension   . Hypocalcemia 04/13/2007  . Hypothyroidism   . Impotence   . 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 Obesity (BMI 30-39.9)   . Osteoporosis    Pt completed 5 years of fosamax in 2013     . Prostate cancer with recurrence    Treated with prostatectomy with recurrence 2012 with Lupron treatment now him   . Psoriasis   . Thyroid cancer ([place]) 1999   Treated with RAI and total thyroidectomy   . Type II diabetes mellitus ([place])      Past Surgical History:  Procedure Laterality Date  . ABDOMINAL EXPLORATION SURGERY     for small bowel obstruction  . APPENDECTOMY  03/2007  . BACK SURGERY    . BASAL CELL CARCINOMA EXCISION  "several"   "head"  . CARDIAC CATHETERIZATION  03/17/2003  . CHOLECYSTECTOMY N/A 05/07/2016   Procedure: LAPAROSCOPIC CHOLECYSTECTOMY;  Surgeon: Fanny Skates, MD;  Location: [facility];  Service: General;  Laterality: N/A;  . COLON SURGERY  03/2007   Resection of cecum, appendix, terminal ileum (approximately/notes 10/10/2010  . HERNIA REPAIR    . LAPAROSCOPIC CHOLECYSTECTOMY  05/07/2016  . LAPAROSCOPIC LYSIS OF ADHESIONS  05/07/2016  . LAPAROSCOPIC LYSIS OF ADHESIONS N/A 05/07/2016   Procedure: LAPAROSCOPIC LYSIS OF ADHESIONS TIMES ONE HOUR;  Surgeon: Fanny Skates, MD;  Location: [facility];  Service:  General;  Laterality: N/A;  . POSTERIOR FUSION THORACIC SPINE  02/08/2016   1. Posterior thoracic arthrodesis T7-T11 utilizing morcellized allograft, 2. Posterior thoracic segmental fixation T7-T11 utilizing nuvasive pedicle screws  . PROSTATECTOMY  06/2001   w/bilateral pelvic lymph nose dissection/notes 10/24/2010  . SPINAL FUSION  12/2014   Open reduction internal fixation of L1 [place] fracture with posterior fusion T10-L4 utilizing morcellized allograft and some local autograft, segmental instrumentation T10-L4 inclusive utilizing nuvasive pedicle screws/notes  12/16/2014  . Stress Cardiolite  02/17/2003  . THOROCOTOMY WITH LOBECTOMY  03/16/2015   Procedure: THOROCOTOMY WITH LOBECTOMY;  Surgeon: Ivin Poot, MD;  Location: [facility];  Service: Thoracic;;  . TONSILLECTOMY    . TOTAL THYROIDECTOMY  1997  . Venous Doppler  05/30/2004  . VENTRAL HERNIA REPAIR  04/14/2008  . VIDEO ASSISTED THORACOSCOPY Left 03/16/2015   Procedure: VIDEO ASSISTED THORACOSCOPY;  Surgeon: Ivin Poot, MD;  [facility];  Service: Thoracic;  Laterality: Left;    Social History:  Ambulatory  independently       reports that he quit smoking about 30 years ago. His smoking use included cigarettes. He has a 60.00 pack-year smoking history. He has never used smokeless tobacco. He reports current alcohol use. He reports that he does not use drugs.    Family History:   Family History  Problem Relation Age of Onset  . Cancer Neg Hx        No cancer in the patient's immediate family, except of course for the patient himself, as noted    Allergies: No Known Allergies   Prior to Admission medications   Medication Sig Start Date End Date Taking? Authorizing Provider  acetaminophen (TYLENOL) 500 MG tablet Take 1,000 mg by mouth 2 (two) times daily.    [provider]  ALPRAZolam (XANAX) 0.5 MG tablet TK 1 T PO Q 6 H PRN 02/16/18   [provider]  ASACOL HD 800 MG TBEC Take 1,600 mg by mouth 2 (two) times daily. 11/14/14   [provider]  atenolol (TENORMIN) 25 MG tablet Take 0.5 mg by mouth. 05/17/16   [provider]  B-D ULTRAFINE III SHORT PEN 31G X 8 MM MISC USE 1 PEN NEEDLE FOUR TIMES DAILY. 09/18/16   Ivar Drape D, PA  busPIRone (BUSPAR) 15 MG tablet Take 15 mg by mouth. 05/17/16   [provider]  calcium carbonate (OS-CAL) 1250 (500 CA) MG chewable tablet Chew 1 tablet by mouth 2 (two) times daily.    [provider]  cefdinir (OMNICEF) 300 MG capsule TK ONE C PO BID FOR 10 DAYS 11/24/17   [provider]  cholestyramine light (PREVALITE) 4 g packet Take by mouth. 07/05/16   [provider]  fluorouracil (EFUDEX) 5 % cream APP ON SKIN BID FOR 2 WEEKS 12/24/17   [provider]  Glucose Blood (ASCENSIA CONTOUR TEST VI) Use as directed to test two times a day     [provider]  glucose blood (BAYER CONTOUR TEST) test strip TEST BLOOD SUGAR TWICE DAILY AS DIRECTED. 05/17/16   Gale Journey, Sarah L, PA-C  glucose blood ([employer] TEST STRIPS) test strip 1 each by Other route 2 times daily. 05/17/16   [provider]  insulin lispro (HUMALOG KWIKPEN) 100 UNIT/ML KiwkPen INJECT UNDER THE SKIN THREE TIMES DAILY( 25 UNITS, 25 UNITS AND 20 UNITS RESPECTIVELY BEFORE MEALS) 05/17/16   Weber, Sarah L, PA-C  insulin lispro (HUMALOG) 100 UNIT/ML KiwkPen Inject into the  skin. 05/17/16   [provider]  Insulin Pen Needle (FIFTY50 PEN NEEDLES) 31G X 8 MM MISC Inject 31 g into the skin. 09/18/16   [provider]  leuprolide (LUPRON) 11.25 MG injection Inject 11.25 mg into the muscle every 6 (six) months.     [provider]  levothyroxine (SYNTHROID, LEVOTHROID) 200 MCG tablet TK 1 TP O QD ON AN EMPTY STOMACH WITH A GLASS OF WATER 12/08/17   [provider]  lisinopril-hydrochlorothiazide (PRINZIDE,ZESTORETIC) 20-12.5 MG tablet Take 1 tablet by mouth daily. Needs office visit for additional refills 01/06/17   Gale Journey, Damaris Hippo, PA-C  Multiple Vitamins-Iron (DAILY-VITE/IRON/BETA-CAROTENE) TABS Take by mouth.    [provider]  Multiple Vitamins-Minerals (CENTRUM SILVER) tablet Take 1 tablet by mouth daily.    [provider]  scopolamine (TRANSDERM-SCOP) 1 MG/3DAYS APP 1 PA BEHIND EAR 1 HOUR BEFORE EMBARACATION AND CHANGE Q 3 DAYS 01/16/18   [provider]  triamcinolone cream (KENALOG) 0.1 % Apply 1 application topically 2 (two) times daily. 03/05/16   Renato Shin, MD   Physical Exam: Vitals with BMI 01/04/2020 01/04/2020  01/04/2020  Height - - -  Weight - - -  BMI - - -  Systolic 749 449 675  Diastolic 75 73 73  Pulse 916 98 103     1. General:  in No  Acute distress   Chronically ill  -appearing 2. Psychological: Alert and   Oriented 3. Head/ENT:   Moist  Mucous Membranes                          Head Non traumatic, neck supple                           Poor Dentition 4. SKIN:   decreased Skin turgor,  Skin clean Dry and intact no rash , sun damaged 5. Heart: Regular rate and rhythm no  Murmur, no Rub or gallop 6. Lungs:  no wheezes or crackles   7. Abdomen: Soft,  non-tender, Non distended  bowel sounds present 8. Lower extremities: no clubbing, cyanosis, L edema, nodularity of the ankle 9. Neurologically Grossly intact, moving all 4 extremities equally   10. MSK: Normal range of motion   All other LABS:     Recent Labs  Lab 01/04/20 1411  WBC 9.6  NEUTROABS 7.9*  HGB 14.0  HCT 44.5  MCV 91.9  PLT 246     Recent Labs  Lab 01/04/20 1411  NA 144  K 4.2  CL 99  CO2 34*  GLUCOSE 107*  BUN 25*  CREATININE 1.25*  CALCIUM 9.2     No results for input(s): AST, ALT, ALKPHOS, BILITOT, PROT, ALBUMIN in the last 168 hours.     Cultures: No results found for: [place], [place], [place], REPTSTATUS   Radiological Exams on Admission: DG Chest 2 View  Result Date: 01/04/2020 CLINICAL DATA:  Shortness of breath for 2 weeks EXAM: CHEST - 2 VIEW COMPARISON:  September 06, 2019, July 02, 2016 FINDINGS: The cardiomediastinal silhouette is unchanged in contour. Unchanged small to moderate LEFT pleural effusion. Surgical clips project over the upper chest. No pneumothorax. LEFT basilar homogeneous opacification, likely atelectasis. RIGHT basilar linear opacities, consistent with atelectasis versus scar. Visualized abdomen is unremarkable. Status post posterior fusion of the thoracic spine. IMPRESSION: Unchanged small to moderate LEFT pleural effusion with favored associated LEFT basilar  atelectasis. Electronically Signed  By: Valentino Saxon MD   On: 01/04/2020 13:21   CT Angio Chest PE W and/or Wo Contrast  Result Date: 01/04/2020 CLINICAL DATA:  79 year old male with shortness of breath. Concern for pulmonary embolism. Elevated D-dimer. EXAM: CT ANGIOGRAPHY CHEST WITH CONTRAST TECHNIQUE: Multidetector CT imaging of the chest was performed using the standard protocol during bolus administration of intravenous contrast. Multiplanar CT image reconstructions and MIPs were obtained to evaluate the vascular anatomy. CONTRAST:  168m OMNIPAQUE IOHEXOL 350 MG/ML SOLN COMPARISON:  Chest CT dated 09/06/2019 and radiograph dated 01/04/2020. FINDINGS: Cardiovascular: There is no cardiomegaly or pericardial effusion. The thoracic aorta is unremarkable. There is no CT evidence of pulmonary embolism. Mediastinum/Nodes: No hilar or mediastinal adenopathy. The esophagus is grossly unremarkable. Thyroidectomy. No mediastinal fluid collection. Lungs/Pleura: There is a small loculated left pleural effusion similar in size to the prior CT. Postsurgical changes of left upper lobectomy. There is a 6 mm nodule in the superior segment of the left lower lobe (21/6) similar to prior CT. No new consolidative changes. No pneumothorax. The central airways are patent. Upper Abdomen: Cholecystectomy. Excreted contrast versus a nonobstructing stone in the upper pole of the right kidney. Musculoskeletal: Osteopenia with degenerative changes of the spine and multilevel posterior fusion hardware. No acute osseous pathology. Review of the MIP images confirms the above findings. IMPRESSION: 1. No acute intrathoracic pathology. No CT evidence of pulmonary embolism. 2. Stable postsurgical changes of left upper lobectomy. 3. Stable small loculated left pleural effusion. 4. A 6 mm left lower lobe pulmonary nodule. Attention on follow-up imaging recommended. Electronically Signed   By: AAnner CreteM.D.   On: 01/04/2020 16:45      Chart has been reviewed    Assessment/Plan   79y.o. 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 NSTEMI (non-ST elevated myocardial infarction) ([place] vs Unstable angina vs hypertensive emergency -intermittent shortness of breath some dyspnea on exertion. No chest pain in particular. EKG nonischemic but troponin somewhat elevated although downtrending. Appreciate cardiology consult. At this point I did not recommend heparin initiation. Continue to monitor. Obtain echogram.    . Adenocarcinoma of left lung, stage 1 ([place]. Small cell lung cancer, left upper lobe ([facility] - will notify Oncology that patient has been  admitted  . Crohn's disease ([place] -chronic stable continue Asacol when able   . Essential hypertension -hold ACE inhibitor unless hydrochlorothiazide for tonight it was already   on hold prior to admission.  Resume atenolol as patient noted to be tachycardic and hypertensive which is likely in the setting of beta-blocker withdrawal. Also elevated troponins could be secondary to hypertensive emergency treat BP as able  . Hyperlipidemia chronic patient not on statin check lipid panel  . Hypothyroid - - Check TSH continue home medications at current dose  . DM (diabetes mellitus), type 2 with renal complications (HCC) -   Order Sensitive   SSI   -  check TSH and HgA1C     . Lung nodule-  emailed oncology patient been followed for the for history of lung cancer   . Prostate cancer with recurrence on Lupron resume as an outpatient   Left lower extremity swelling.  Symmetric.  With history of malignancy will obtain Dopplers.  Since some nodularity was palpable obtain plain images to evaluate  for bony metastases which were  unremarkable. Other differential includes erythema nodosum in the setting of Crohn's disease but the lack of erythema is making it somewhat less likely.  Although there are palpable nodularities which are painful to the touch under the skin Other plan as per orders.  DVT prophylaxis:     Lovenox       Code Status:    Code Status: Prior FULL CODE  as per patient  I had personally discussed CODE STATUS with patient    Family Communication:   Family   at  Bedside  plan of care was discussed   with  Wife,  Disposition Plan:     To home once workup is complete and patient is stable   Following barriers for discharge:                                                        Pain controlled with PO medications                                                            Will need to be able to tolerate PO                                                       Will need consultants to evaluate patient prior to discharge                       Would benefit from PT/OT eval prior to [place] called: cardiology is aware will see in a.m. Keep n.p.o. post midnight for possible stress testing Admission status:  ED Disposition    ED Disposition Condition [place]: [place] [[id number]]  Level of Care: Stepdown [14]  Admit to SDU based on following criteria: Hemodynamic compromise or significant risk of instability:  Patient requiring short term acute titration and management of vasoactive drips, and invasive monitoring (i.e., CVP and Arterial line).  Admit to SDU based on following criteria: Cardiac Instability:  Patients experiencing chest pain, unconfirmed MI and stable, arrhythmias and CHF requiring medical management and potentially compromising patient's stability  Covid Evaluation: Confirmed COVID Negative  Diagnosis: NSTEMI (non-ST elevated myocardial infarction) [facility]) [[id number]]  Admitting Physician:  Toy Baker [[id number]]  Attending Physician: Toy Baker [[id number]]      Obs     Level of care      SDU tele indefinitely please discontinue once patient no longer qualifies COVID-19 Labs    Lab Results  Component Value Date   [place] NEGATIVE 01/04/2020     Precautions: admitted as Covid Negative      PPE: Used by the provider:   N95  eye Goggles,  Gloves      Oluwasemilore Pascuzzi 01/04/2020, 10:11 PM    [facility]     after 2 AM please page floor coverage PA If 7AM-7PM, please contact the day team taking care of the patient using [email or web address]   Patient was evaluated in the context of the global COVID-19 pandemic, which necessitated consideration that the patient might be at risk for infection with the SARS-CoV-2 virus that causes COVID-19. Institutional protocols and algorithms that pertain to the evaluation of patients at risk for COVID-19 are in a state of rapid change based on information released by regulatory bodies including the CDC and federal and state organizations. These policies and algorithms were followed during the patient's care.

## 2020-01-04 NOTE — ED Notes (Signed)
Date and time results received: 01/04/20 5:19 PM  Test: Troponin Critical Value: 299  Name of Provider Notified: Roslynn Amble, EDP

## 2020-01-05 ENCOUNTER — Ambulatory Visit (HOSPITAL_COMMUNITY)
Admission: RE | Admit: 2020-01-05 | Discharge: 2020-01-05 | Disposition: A | Discharge status: Home / Self Care | Payer: Medicare Other | Source: Ambulatory Visit | Attending: Physician Assistant | Admitting: Physician Assistant

## 2020-01-05 ENCOUNTER — Encounter (HOSPITAL_COMMUNITY): Payer: Self-pay | Admitting: Internal Medicine

## 2020-01-05 ENCOUNTER — Observation Stay (HOSPITAL_BASED_OUTPATIENT_CLINIC_OR_DEPARTMENT_OTHER): Payer: Medicare Other

## 2020-01-05 DIAGNOSIS — C61 Malignant neoplasm of prostate: Secondary | ICD-10-CM

## 2020-01-05 DIAGNOSIS — I160 Hypertensive urgency: Secondary | ICD-10-CM | POA: Diagnosis not present

## 2020-01-05 DIAGNOSIS — R609 Edema, unspecified: Secondary | ICD-10-CM

## 2020-01-05 DIAGNOSIS — R778 Other specified abnormalities of plasma proteins: Secondary | ICD-10-CM | POA: Diagnosis not present

## 2020-01-05 DIAGNOSIS — R079 Chest pain, unspecified: Secondary | ICD-10-CM

## 2020-01-05 DIAGNOSIS — I214 Non-ST elevation (NSTEMI) myocardial infarction: Secondary | ICD-10-CM

## 2020-01-05 DIAGNOSIS — C3490 Malignant neoplasm of unspecified part of unspecified bronchus or lung: Secondary | ICD-10-CM | POA: Diagnosis not present

## 2020-01-05 DIAGNOSIS — E1122 Type 2 diabetes mellitus with diabetic chronic kidney disease: Secondary | ICD-10-CM | POA: Diagnosis not present

## 2020-01-05 DIAGNOSIS — R0600 Dyspnea, unspecified: Secondary | ICD-10-CM | POA: Insufficient documentation

## 2020-01-05 DIAGNOSIS — K5090 Crohn's disease, unspecified, without complications: Secondary | ICD-10-CM | POA: Diagnosis not present

## 2020-01-05 DIAGNOSIS — R0902 Hypoxemia: Secondary | ICD-10-CM | POA: Diagnosis not present

## 2020-01-05 DIAGNOSIS — C7989 Secondary malignant neoplasm of other specified sites: Secondary | ICD-10-CM

## 2020-01-05 DIAGNOSIS — R911 Solitary pulmonary nodule: Secondary | ICD-10-CM

## 2020-01-05 DIAGNOSIS — C3492 Malignant neoplasm of unspecified part of left bronchus or lung: Secondary | ICD-10-CM | POA: Diagnosis not present

## 2020-01-05 DIAGNOSIS — I10 Essential (primary) hypertension: Secondary | ICD-10-CM | POA: Diagnosis not present

## 2020-01-05 DIAGNOSIS — E039 Hypothyroidism, unspecified: Secondary | ICD-10-CM

## 2020-01-05 DIAGNOSIS — I21A1 Myocardial infarction type 2: Secondary | ICD-10-CM | POA: Diagnosis not present

## 2020-01-05 DIAGNOSIS — N183 Chronic kidney disease, stage 3 unspecified: Secondary | ICD-10-CM

## 2020-01-05 DIAGNOSIS — R0602 Shortness of breath: Secondary | ICD-10-CM | POA: Diagnosis not present

## 2020-01-05 DIAGNOSIS — E785 Hyperlipidemia, unspecified: Secondary | ICD-10-CM

## 2020-01-05 DIAGNOSIS — C3412 Malignant neoplasm of upper lobe, left bronchus or lung: Secondary | ICD-10-CM

## 2020-01-05 DIAGNOSIS — Z794 Long term (current) use of insulin: Secondary | ICD-10-CM

## 2020-01-05 LAB — ECHOCARDIOGRAM COMPLETE
Area-P 1/2: 3.77 cm2
Height: 70 in
S' Lateral: 3.5 cm
Weight: 2880 oz

## 2020-01-05 LAB — MRSA PCR SCREENING: MRSA by PCR: NEGATIVE

## 2020-01-05 LAB — CBC WITH DIFFERENTIAL/PLATELET
Abs Immature Granulocytes: 0.02 10*3/uL (ref 0.00–0.07)
Basophils Absolute: 0 10*3/uL (ref 0.0–0.1)
Basophils Relative: 1 %
Eosinophils Absolute: 0.1 10*3/uL (ref 0.0–0.5)
Eosinophils Relative: 1 %
HCT: 40.7 % (ref 39.0–52.0)
Hemoglobin: 12.7 g/dL — ABNORMAL LOW (ref 13.0–17.0)
Immature Granulocytes: 0 %
Lymphocytes Relative: 18 %
Lymphs Abs: 1.5 10*3/uL (ref 0.7–4.0)
MCH: 29.1 pg (ref 26.0–34.0)
MCHC: 31.2 g/dL (ref 30.0–36.0)
MCV: 93.3 fL (ref 80.0–100.0)
Monocytes Absolute: 0.7 10*3/uL (ref 0.1–1.0)
Monocytes Relative: 8 %
Neutro Abs: 6.2 10*3/uL (ref 1.7–7.7)
Neutrophils Relative %: 72 %
Platelets: 237 10*3/uL (ref 150–400)
RBC: 4.36 MIL/uL (ref 4.22–5.81)
RDW: 13.5 % (ref 11.5–15.5)
WBC: 8.6 10*3/uL (ref 4.0–10.5)
nRBC: 0 % (ref 0.0–0.2)

## 2020-01-05 LAB — GLUCOSE, CAPILLARY
Glucose-Capillary: 84 mg/dL (ref 70–99)
Glucose-Capillary: 84 mg/dL (ref 70–99)
Glucose-Capillary: 78 mg/dL (ref 70–99)
Glucose-Capillary: 135 mg/dL — ABNORMAL HIGH (ref 70–99)
Glucose-Capillary: 189 mg/dL — ABNORMAL HIGH (ref 70–99)

## 2020-01-05 LAB — COMPREHENSIVE METABOLIC PANEL
ALT: 21 U/L (ref 0–44)
AST: 25 U/L (ref 15–41)
Albumin: 3.1 g/dL — ABNORMAL LOW (ref 3.5–5.0)
Alkaline Phosphatase: 70 U/L (ref 38–126)
Anion gap: 13 (ref 5–15)
BUN: 21 mg/dL (ref 8–23)
CO2: 31 mmol/L (ref 22–32)
Calcium: 8.9 mg/dL (ref 8.9–10.3)
Chloride: 98 mmol/L (ref 98–111)
Creatinine, Ser: 1.07 mg/dL (ref 0.61–1.24)
GFR calc Af Amer: >60 mL/min (ref >60)
GFR calc non Af Amer: >60 mL/min (ref >60)
Glucose, Bld: 101 mg/dL — ABNORMAL HIGH (ref 70–99)
Potassium: 3.6 mmol/L (ref 3.5–5.1)
Sodium: 142 mmol/L (ref 135–145)
Total Bilirubin: 0.6 mg/dL (ref 0.3–1.2)
Total Protein: 5.9 g/dL — ABNORMAL LOW (ref 6.5–8.1)

## 2020-01-05 LAB — C-REACTIVE PROTEIN: CRP: 0.5 mg/dL (ref <1.0)

## 2020-01-05 LAB — LIPID PANEL
Cholesterol: 89 mg/dL (ref 0–200)
HDL: 50 mg/dL (ref >40)
LDL Cholesterol: 26 mg/dL (ref 0–99)
Total CHOL/HDL Ratio: 1.8 RATIO
Triglycerides: 63 mg/dL (ref <150)
VLDL: 13 mg/dL (ref 0–40)

## 2020-01-05 LAB — MAGNESIUM: Magnesium: 1.8 mg/dL (ref 1.7–2.4)

## 2020-01-05 LAB — TSH: TSH: 0.126 u[IU]/mL — ABNORMAL LOW (ref 0.350–4.500)

## 2020-01-05 LAB — HEMOGLOBIN A1C
Hgb A1c MFr Bld: 6.3 % — ABNORMAL HIGH (ref 4.8–5.6)
Mean Plasma Glucose: 134.11 mg/dL

## 2020-01-05 LAB — SEDIMENTATION RATE: Sed Rate: 11 mm/hr (ref 0–16)

## 2020-01-05 LAB — PHOSPHORUS: Phosphorus: 3.2 mg/dL (ref 2.5–4.6)

## 2020-01-05 LAB — T4, FREE: Free T4: 1.94 ng/dL — ABNORMAL HIGH (ref 0.61–1.12)

## 2020-01-05 MED ORDER — ASPIRIN 81 MG PO CHEW
324.0000 mg | CHEWABLE_TABLET | Freq: Once | ORAL | Status: AC
  Administered 2020-01-05: 324 mg via ORAL
  Filled 2020-01-05: qty 4

## 2020-01-05 MED ORDER — MORPHINE SULFATE (PF) 2 MG/ML IV SOLN
1.0000 mg | Freq: Once | INTRAVENOUS | Status: AC
  Administered 2020-01-05: 1 mg via INTRAVENOUS

## 2020-01-05 MED ORDER — SODIUM CHLORIDE 0.9 % IV SOLN: INTRAVENOUS | Status: AC

## 2020-01-05 MED ORDER — INSULIN ASPART 100 UNIT/ML ~~LOC~~ SOLN: 0.0000 [IU] | Freq: Every day | SUBCUTANEOUS | Status: DC

## 2020-01-05 MED ORDER — INSULIN ASPART 100 UNIT/ML ~~LOC~~ SOLN
0.0000 [IU] | Freq: Three times a day (TID) | SUBCUTANEOUS | Status: DC
  Administered 2020-01-06 (×2): 1 [IU] via SUBCUTANEOUS

## 2020-01-05 MED ORDER — ADULT MULTIVITAMIN W/MINERALS CH
1.0000 | ORAL_TABLET | Freq: Every day | ORAL | Status: DC
  Administered 2020-01-06: 1 via ORAL
  Filled 2020-01-05: qty 1

## 2020-01-05 MED ORDER — PROPRANOLOL HCL 10 MG PO TABS
10.0000 mg | ORAL_TABLET | Freq: Two times a day (BID) | ORAL | Status: DC
  Administered 2020-01-05: 10 mg via ORAL
  Filled 2020-01-05 (×2): qty 1

## 2020-01-05 MED ORDER — ACETAMINOPHEN 325 MG PO TABS: 650.0000 mg | ORAL_TABLET | Freq: Four times a day (QID) | ORAL | Status: DC | PRN

## 2020-01-05 MED ORDER — ASPIRIN EC 81 MG PO TBEC
81.0000 mg | DELAYED_RELEASE_TABLET | Freq: Every day | ORAL | Status: DC
  Administered 2020-01-06: 81 mg via ORAL
  Filled 2020-01-05: qty 1

## 2020-01-05 MED ORDER — DIPHENHYDRAMINE HCL 50 MG/ML IJ SOLN
25.0000 mg | Freq: Every evening | INTRAMUSCULAR | Status: DC | PRN
  Administered 2020-01-05 (×2): 25 mg via INTRAVENOUS
  Filled 2020-01-05 (×2): qty 1

## 2020-01-05 MED ORDER — ALBUTEROL SULFATE (2.5 MG/3ML) 0.083% IN NEBU: 2.5000 mg | INHALATION_SOLUTION | RESPIRATORY_TRACT | Status: DC | PRN

## 2020-01-05 MED ORDER — KATE FARMS STANDARD 1.4 PO LIQD
325.0000 mL | Freq: Two times a day (BID) | ORAL | Status: DC
  Administered 2020-01-05 – 2020-01-06 (×2): 325 mL via ORAL
  Filled 2020-01-05 (×3): qty 325

## 2020-01-05 MED ORDER — MORPHINE SULFATE (PF) 2 MG/ML IV SOLN
INTRAVENOUS | Status: AC
  Filled 2020-01-05: qty 1

## 2020-01-05 MED ORDER — ENOXAPARIN SODIUM 40 MG/0.4ML ~~LOC~~ SOLN
40.0000 mg | SUBCUTANEOUS | Status: DC
  Administered 2020-01-06: 40 mg via SUBCUTANEOUS
  Filled 2020-01-05: qty 0.4

## 2020-01-05 MED ORDER — TECHNETIUM TC 99M TETROFOSMIN IV KIT
30.9000 | PACK | Freq: Once | INTRAVENOUS | Status: AC | PRN
  Administered 2020-01-05: 30.9 via INTRAVENOUS

## 2020-01-05 MED ORDER — REGADENOSON 0.4 MG/5ML IV SOLN
INTRAVENOUS | Status: AC
  Filled 2020-01-05: qty 5

## 2020-01-05 MED ORDER — TECHNETIUM TC 99M TETROFOSMIN IV KIT
10.1000 | PACK | Freq: Once | INTRAVENOUS | Status: AC | PRN
  Administered 2020-01-05: 10.1 via INTRAVENOUS

## 2020-01-05 MED ORDER — SODIUM CHLORIDE 0.9% FLUSH
3.0000 mL | Freq: Two times a day (BID) | INTRAVENOUS | Status: DC
  Administered 2020-01-05 – 2020-01-06 (×4): 3 mL via INTRAVENOUS

## 2020-01-05 MED ORDER — PROSOURCE PLUS PO LIQD
30.0000 mL | Freq: Two times a day (BID) | ORAL | Status: DC
  Administered 2020-01-06: 30 mL via ORAL
  Filled 2020-01-05 (×4): qty 30

## 2020-01-05 MED ORDER — DOCUSATE SODIUM 100 MG PO CAPS
100.0000 mg | ORAL_CAPSULE | Freq: Two times a day (BID) | ORAL | Status: DC
  Administered 2020-01-05: 100 mg via ORAL
  Filled 2020-01-05: qty 1

## 2020-01-05 MED ORDER — LEVOTHYROXINE SODIUM 100 MCG PO TABS
200.0000 ug | ORAL_TABLET | Freq: Every day | ORAL | Status: DC
  Administered 2020-01-05 – 2020-01-06 (×2): 200 ug via ORAL
  Filled 2020-01-05 (×2): qty 2

## 2020-01-05 MED ORDER — ACETAMINOPHEN 650 MG RE SUPP: 650.0000 mg | Freq: Four times a day (QID) | RECTAL | Status: DC | PRN

## 2020-01-05 MED ORDER — BUSPIRONE HCL 5 MG PO TABS
15.0000 mg | ORAL_TABLET | Freq: Every day | ORAL | Status: DC
  Administered 2020-01-06: 15 mg via ORAL
  Filled 2020-01-05: qty 1

## 2020-01-05 MED ORDER — ONDANSETRON HCL 4 MG/2ML IJ SOLN: 4.0000 mg | Freq: Four times a day (QID) | INTRAMUSCULAR | Status: DC | PRN

## 2020-01-05 MED ORDER — REGADENOSON 0.4 MG/5ML IV SOLN
0.4000 mg | Freq: Once | INTRAVENOUS | Status: AC
  Administered 2020-01-05: 0.4 mg via INTRAVENOUS
  Filled 2020-01-05: qty 5

## 2020-01-05 MED ORDER — HYDROCODONE-ACETAMINOPHEN 5-325 MG PO TABS: 1.0000 | ORAL_TABLET | ORAL | Status: DC | PRN

## 2020-01-05 MED ORDER — ATENOLOL 25 MG PO TABS
12.5000 mg | ORAL_TABLET | Freq: Every day | ORAL | Status: DC
  Administered 2020-01-05 – 2020-01-06 (×2): 12.5 mg via ORAL
  Filled 2020-01-05 (×2): qty 1

## 2020-01-05 MED ORDER — ONDANSETRON HCL 4 MG PO TABS: 4.0000 mg | ORAL_TABLET | Freq: Four times a day (QID) | ORAL | Status: DC | PRN

## 2020-01-05 MED ORDER — METHIMAZOLE 5 MG PO TABS
5.0000 mg | ORAL_TABLET | Freq: Two times a day (BID) | ORAL | Status: DC
  Administered 2020-01-05: 5 mg via ORAL
  Filled 2020-01-05 (×2): qty 1

## 2020-01-05 MED ORDER — CHLORHEXIDINE GLUCONATE CLOTH 2 % EX PADS
6.0000 | MEDICATED_PAD | Freq: Every day | CUTANEOUS | Status: DC
  Administered 2020-01-05 – 2020-01-06 (×2): 6 via TOPICAL

## 2020-01-05 NOTE — Progress Notes (Signed)
   Devin [name] presented for a nuclear stress test today.  No immediate complications.  Stress imaging is pending at this time.  Preliminary EKG findings may be listed in the chart, but the stress test result will not be finalized until perfusion imaging is complete.  One day study, [facility] to read.  Abigail Butts, PA-C 01/05/2020, 2:49 PM

## 2020-01-05 NOTE — Progress Notes (Signed)
Left lower extremity venous duplex has been completed. Preliminary results can be found in CV Proc through chart review.   01/05/20 12:04 PM Devin [name] RVT

## 2020-01-05 NOTE — Progress Notes (Signed)
  Echocardiogram 2D Echocardiogram has been performed.  [name] 01/05/2020, 9:17 AM

## 2020-01-05 NOTE — TOC Initial Note (Signed)
Transition of Care [facility]) - Initial/Assessment Note    Patient Details  Name: Devin [name] MRN: [id number] Date of Birth: 14-Dec-1940  [facility]) CM/SW Contact:    Leeroy Cha, RN Phone Number: 01/05/2020, 9:18 AM  Clinical Narrative:                 s-nstemi with elevated tropins. p-follow for toc needs and dc.   Expected Discharge Plan: Home/Self Care Barriers to Discharge: Continued Medical Work up   Patient Goals and CMS Choice Patient states their goals for this hospitalization and ongoing recovery are:: to go home CMS Medicare.gov Compare Post Acute Care list provided to:: Patient    Expected Discharge Plan and Services Expected Discharge Plan: Home/Self Care   Discharge Planning Services: CM Consult   Living arrangements for the past 2 months: Single Family Home                                      Prior Living Arrangements/Services Living arrangements for the past 2 months: Single Family Home Lives with:: Spouse Patient language and need for interpreter reviewed:: Yes Do you feel safe going back to the place where you live?: Yes      Need for Family Participation in Patient Care: Yes (Comment) Care giver support system in place?: Yes (comment)   Criminal Activity/Legal Involvement Pertinent to Current Situation/Hospitalization: No - Comment as needed  Activities of Daily Living Home Assistive Devices/Equipment: None ADL Screening (condition at time of admission) Patient's cognitive ability adequate to safely complete daily activities?: Yes Is the patient deaf or have difficulty hearing?: No Does the patient have difficulty seeing, even when wearing glasses/contacts?: No Does the patient have difficulty concentrating, remembering, or making decisions?: No Patient able to express need for assistance with ADLs?: Yes Does the patient have difficulty dressing or bathing?: No Independently performs ADLs?: Yes (appropriate for  developmental age) Does the patient have difficulty walking or climbing stairs?: No Weakness of Legs: Both Weakness of Arms/Hands: Both  Permission Sought/Granted                  Emotional Assessment Appearance:: Appears stated age Attitude/Demeanor/Rapport: Engaged Affect (typically observed): Calm Orientation: : Oriented to Self, Oriented to Place, Oriented to  Time, Oriented to Situation Alcohol / Substance Use: Not Applicable Psych Involvement: No (comment)  Admission diagnosis:  Shortness of breath [R06.02] Swelling [R60.9] Elevated troponin [R77.8] NSTEMI (non-ST elevated myocardial infarction) [facility]) [I21.4] Patient Active Problem List   Diagnosis Date Noted  . NSTEMI (non-ST elevated myocardial infarction) ([place]) 01/04/2020  . Crohn's ileocolitis ([place]) 02/21/2017  . Hyperlipidemia 02/21/2017  . Hyperuricemia 02/21/2017  . Hypothyroid 02/21/2017  . 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 Thyroid cancer ([place]) 02/21/2017  . Type 1 diabetes mellitus ([place]) 02/21/2017  . Cholecystitis with cholelithiasis 05/07/2016  . Cholecystitis 03/14/2016  . Abdominal pain 03/05/2016  . Malnutrition of moderate degree 02/10/2016  . Thoracic compression fracture ([place]) 02/08/2016  . Back pain 01/26/2016  . Hip pain 01/26/2016  . S/P lobectomy of lung 03/16/2015  . Adenocarcinoma of left lung, stage 1 ([place]) 03/10/2015  . Diabetic neuropathy ([place])   . Ankylosing spondylitis ([place])   . Obesity (BMI 30-39.9)   . Crohn's disease ([place])   . Lung nodule 02/23/2015  . HIstory of basal cell cancer of face   .  History of thyroid cancer   . Postsurgical hypothyroidism   . Gout 12/29/2006  . Essential hypertension 12/29/2006  . Osteoporosis 12/29/2006  . DM (diabetes mellitus), type 2 with renal complications ([place])   . Prostate cancer with recurrence    PCP:  Prince Solian, MD Pharmacy:   [facility], [place] [place] [address] Phone: [phone] Fax: [phone]  [place], [address], [address] [place], [place] [address] Phone: [phone] Fax: [place], [place] [address] [place] [address] Phone: [phone] Fax: [place] [address] [place] [address] Phone: [phone] Fax: [place], [place] [address] Phone: [phone] Fax: [phone]     Social Determinants of Health (SDOH) Interventions    Readmission Risk Interventions No flowsheet data found.

## 2020-01-05 NOTE — Progress Notes (Signed)
PT Cancellation Note  Patient Details Name: Devin [name] MRN: [id number] DOB: 03/18/1941   Cancelled Treatment:    Reason Eval/Treat Not Completed: Patient at procedure or test/unavailable (Patient off unit for procedure, will follow up at later date/time as schedule allows and pt medically ready.)   Verner Mould, DPT Acute Rehabilitation Services  Office [phone] Pager [phone]  01/05/2020 1:28 PM

## 2020-01-05 NOTE — Progress Notes (Signed)
Reviewed the nuclear perfusion images. In my opinion they are completely normal. Echo was also essentially a normal study. Dyspnea is not cardiac in origin. Suggest evaluation by Pulmonology in view of his previous left upper lobectomy and chronic loculated left pleural effusion. Will check inflammatory markers and repeat the troponin in AM.

## 2020-01-05 NOTE — Consult Note (Addendum)
Cardiology Consultation:   Patient ID: BISHOP VANDERWERF MRN: [id number]; DOB: November 20, 1940  Admit date: 01/04/2020 Date of Consult: 01/05/2020  Primary Care Provider: Prince Solian, MD [facility] Cardiologist: Sanda Klein, MD (new this admission - remotely Dr. Wynonia Lawman) [place] Electrophysiologist:  None    Patient Profile:   Devin [name] is a 79 y.o. male with a hx of HTN, non-small-cell lung cancer, ankylosing spondylitis, Crohn's disease, prostate CA, thyroid CA s/p RAI and total thyroidectomy then hypothyroidism, IDDM2, CKD stage III who is being seen today for the evaluation of shortness of breath at the request of Dr. Roel Cluck.  History of Present Illness:   It appears he remotely saw Dr. Wynonia Lawman several years ago for pre-operative evaluation, at which time he noted he'd had a cath in 2004 which demonstrated only minimal irregularities in his coronary arteries, LVEF 60%. The patient said he has no formal cardiac history, only previously followed for preventative care. He is followed by oncology for history of Stage IA (T1b, N0, M0) non-small cell lung cancer, adenocarcinoma presenting with left upper lobe pulmonary nodule s/p left VATS with LULectomy in 2016, now currently under observation. He reports he's lost about 70lb in several years time due to various issues and back surgery. Prior imaging have shown a small loculated pleural effusion.  He presented to the hospital with several week hx of SOB. He reports about 3-4 weeks ago he began feeling more SOB than usual. This seemed primarily when he would go to lie down at night but also with exertion. The SOB would ease off as the night went on. He saw his PCP and CXR was felt consistent with PNA. He was treated with a round of abx and initially saw some improvement but SOB recurred so he was prescribed a second course. He noted worsening SOB yesterday so came to the ED. He has not had any fevers, chills, cough, chest pain  or wheezing. Labs reveal BNP 125, troponin 209->299->173->341, TSH low at 0.126, Hgb 14.0->12.7, BNP Mild AKI with BUN/Cr 25/1.25 , d-dimer 1.00, albumin 3.1. CXR showed unchanged small-moderate left pleural effusion with favored associated left basilar atelectasis. CT angio showed no acute intrathoracic pathology, post surgical changes of LULobectomy, stable small loculated pleural effusion, 56m pulmonary nodule which will require attention on follow-up. He is also noted to be mildly tachycardic and hypertensive, question whether any relationship to his thyroid status. No hx of known Covid. Has been fully vaccinated.   Past Medical History:  Diagnosis Date  . Adenocarcinoma of left lung, stage 1 ([facility] 03/10/2015  . Ankylosing [place]ondylitis ([facility]    Diagnosed during lumbar fracture summer of 2016    . Crohn's disease ([facility]   . Gout   . HIstory of basal cell cancer of face    THYROID CA HX  . Hypertension   . Hypocalcemia 04/13/2007  . Hypothyroidism   . Impotence   . 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 Obesity (BMI 30-39.9)   . Osteoporosis    Pt completed 5 years of fosamax in 2013     . Prostate cancer with recurrence    Treated with prostatectomy with recurrence 2012 with Lupron treatment now him   . Psoriasis   . Thyroid cancer ([facility] [place] 1999   Treated with RAI and total thyroidectomy   . Type II diabetes mellitus ([facility]     Past Surgical History:  Procedure Laterality Date  . ABDOMINAL EXPLORATION SURGERY     for small  bowel obstruction  . APPENDECTOMY  03/2007  . BACK SURGERY    . BASAL CELL CARCINOMA EXCISION  "several"   "head"  . CARDIAC CATHETERIZATION  03/17/2003  . CHOLECYSTECTOMY N/A 05/07/2016   Procedure: LAPAROSCOPIC CHOLECYSTECTOMY;  Surgeon: Fanny Skates, MD;  Location: [facility];  Service: General;  Laterality: N/A;  . COLON SURGERY  03/2007   Resection of cecum, appendix, terminal ileum (approximately/notes 10/10/2010  . HERNIA REPAIR    .  LAPAROSCOPIC CHOLECYSTECTOMY  05/07/2016  . LAPAROSCOPIC LYSIS OF ADHESIONS  05/07/2016  . LAPAROSCOPIC LYSIS OF ADHESIONS N/A 05/07/2016   Procedure: LAPAROSCOPIC LYSIS OF ADHESIONS TIMES ONE HOUR;  Surgeon: Fanny Skates, MD;  Location: [place];  Service: General;  Laterality: N/A;  . POSTERIOR FUSION THORACIC SPINE  02/08/2016   1. Posterior thoracic arthrodesis T7-T11 utilizing morcellized allograft, 2. Posterior thoracic segmental fixation T7-T11 utilizing nuvasive pedicle screws  . PROSTATECTOMY  06/2001   w/bilateral pelvic lymph nose dissection/notes 10/24/2010  . SPINAL FUSION  12/2014   Open reduction internal fixation of L1 Chance fracture with posterior fusion T10-L4 utilizing morcellized allograft and some local autograft, segmental instrumentation T10-L4 inclusive utilizing nuvasive pedicle screws/notes 12/16/2014  . Stress Cardiolite  02/17/2003  . THOROCOTOMY WITH LOBECTOMY  03/16/2015   Procedure: THOROCOTOMY WITH LOBECTOMY;  Surgeon: Ivin Poot, MD;  Location: [place];  Service: Thoracic;;  . TONSILLECTOMY    . TOTAL THYROIDECTOMY  1997  . Venous Doppler  05/30/2004  . VENTRAL HERNIA REPAIR  04/14/2008  . VIDEO ASSISTED THORACOSCOPY Left 03/16/2015   Procedure: VIDEO ASSISTED THORACOSCOPY;  Surgeon: Ivin Poot, MD;  Location: [facility] OR;  Service: Thoracic;  Laterality: Left;     Home Medications:  Prior to Admission medications   Medication Sig Start Date End Date Taking? Authorizing Provider  ASACOL HD 800 MG TBEC Take 1,600 mg by mouth 2 (two) times daily. 11/14/14  Yes [provider]  atenolol (TENORMIN) 25 MG tablet Take 0.5 mg by mouth. 05/17/16  Yes [provider]  B-D ULTRAFINE III SHORT PEN 31G X 8 MM MISC USE 1 PEN NEEDLE FOUR TIMES DAILY. 09/18/16  Yes English, [name] D, PA  busPIRone (BUSPAR) 15 MG tablet Take 15 mg by mouth daily.  05/17/16  Yes [provider]  calcium-vitamin D (OSCAL WITH D) 500-200 MG-UNIT tablet Take 1 tablet by  mouth daily.   Yes [provider]  diphenhydramine-acetaminophen (TYLENOL PM) 25-500 MG TABS tablet Take 1 tablet by mouth at bedtime.   Yes [provider]  Glucose Blood (ASCENSIA CONTOUR TEST VI) Use as directed to test two times a day    Yes [provider]  glucose blood (BAYER CONTOUR TEST) test strip TEST BLOOD SUGAR TWICE DAILY AS DIRECTED. 05/17/16  Yes Weber, Sarah L, PA-C  glucose blood ([employer] TEST STRIPS) test strip 1 each by Other route 2 times daily. 05/17/16  Yes [provider]  insulin lispro (HUMALOG KWIKPEN) 100 UNIT/ML KiwkPen INJECT UNDER THE SKIN THREE TIMES DAILY( 25 UNITS, 25 UNITS AND 20 UNITS RESPECTIVELY BEFORE MEALS) Patient taking differently: Inject 20-30 Units into the skin See admin instructions. INJECT UNDER THE SKIN THREE TIMES DAILY( 25 units in the morning, 20 units in the afternoon, 30 units in the evening per patient. 05/17/16  Yes Weber, Sarah L, PA-C  insulin lispro (HUMALOG) 100 UNIT/ML KiwkPen Inject into the skin. 05/17/16  Yes [provider]  Insulin Pen Needle (FIFTY50 PEN NEEDLES) 31G X 8 MM MISC Inject 31  g into the skin. 09/18/16  Yes [provider]  leuprolide (LUPRON) 11.25 MG injection Inject 11.25 mg into the muscle every 6 (six) months.    Yes [provider]  levothyroxine (SYNTHROID, LEVOTHROID) 200 MCG tablet Take 200 mcg by mouth daily before breakfast.  12/08/17  Yes [provider]  Multiple Vitamins-Iron (DAILY-VITE/IRON/BETA-CAROTENE) TABS Take 1 tablet by mouth daily.    Yes [provider]  lisinopril-hydrochlorothiazide (PRINZIDE,ZESTORETIC) 20-12.5 MG tablet Take 1 tablet by mouth daily. Needs office visit for additional refills Patient not taking: Reported on 01/04/2020 01/06/17   Gale Journey, Damaris Hippo, PA-C  triamcinolone cream (KENALOG) 0.1 % Apply 1 application topically 2 (two) times daily. Patient not taking: Reported on 01/04/2020 03/05/16   Renato Shin, MD     Inpatient Medications: Scheduled Meds: . atenolol  12.5 mg Oral Daily  . busPIRone  15 mg Oral Daily  . Chlorhexidine Gluconate Cloth  6 each Topical Daily  . docusate sodium  100 mg Oral BID  . enoxaparin (LOVENOX) injection  40 mg Subcutaneous Q24H  . insulin aspart  0-9 Units Subcutaneous Q4H  . levothyroxine  200 mcg Oral QAC breakfast  . sodium chloride flush  3 mL Intravenous Q12H   Continuous Infusions: . sodium chloride Stopped (01/05/20 0852)   PRN Meds: acetaminophen **OR** acetaminophen, albuterol, diphenhydrAMINE, HYDROcodone-acetaminophen, ondansetron **OR** ondansetron (ZOFRAN) IV  Allergies:   No Known Allergies  Social History:   Social History   Socioeconomic History  . Marital status: Married    Spouse name: Not on file  . Number of children: Not on file  . Years of education: Not on file  . Highest education level: Not on file  Occupational History  . Occupation: Retired  Tobacco Use  . Smoking status: Former Smoker    Packs/day: 2.00    Years: 30.00    Pack years: 60.00    Types: Cigarettes    Quit date: 12/26/1989    Years since quitting: 30.0  . Smokeless tobacco: Never Used  Vaping Use  . Vaping Use: Never used  Substance and Sexual Activity  . Alcohol use: Yes    Alcohol/week: 0.0 standard drinks    Comment: 02/08/2016 "might have a couple drinks/year; might not"  . Drug use: No  . Sexual activity: Never  Other Topics Concern  . Not on file  Social History Narrative   Retired Development worker, international aid   Social Determinants of Radio broadcast assistant Strain:   . Difficulty of Paying Living Expenses:   Food Insecurity:   . Worried About Charity fundraiser in the Last Year:   . Arboriculturist in the Last Year:   Transportation Needs:   . Film/video editor (Medical):   Marland Kitchen Lack of Transportation (Non-Medical):   Physical Activity:   . Days of Exercise per Week:   . Minutes of Exercise per Session:   Stress:   . Feeling of Stress :    Social Connections:   . Frequency of Communication with Friends and Family:   . Frequency of Social Gatherings with Friends and Family:   . Attends Religious Services:   . Active Member of Clubs or Organizations:   . Attends Archivist Meetings:   Marland Kitchen Marital Status:   Intimate Partner Violence:   . Fear of Current or Ex-Partner:   . Emotionally Abused:   Marland Kitchen Physically Abused:   . Sexually Abused:     Family History:    Family History  Problem Relation Age of Onset  . Cancer Neg Hx        No cancer in the patient's immediate family, except of course for the patient himself, as noted     ROS:  Please see the history of present illness.  All other ROS reviewed and negative.     Physical Exam/Data:   Vitals:   01/05/20 0500 01/05/20 0600 01/05/20 0700 01/05/20 0800  BP: (!) 168/75 (!) 185/75 (!) 167/71   Pulse: 86 90 86   Resp: (!) 27 18 (!) 27   Temp:    97.9 F (36.6 C)  TempSrc:    Oral  SpO2: 95% 95% 95%   Weight:      Height:        Intake/Output Summary (Last 24 hours) at 01/05/2020 0903 Last data filed at 01/05/2020 0743 Gross per 24 hour  Intake 109.7 ml  Output --  Net 109.7 ml   Last 3 Weights 01/04/2020 09/08/2019 03/02/2018  Weight (lbs) 180 lb 194 lb 3.2 oz 205 lb 9.6 oz  Weight (kg) 81.647 kg 88.089 kg 93.26 kg     Body mass index is 25.83 kg/m.  General: Well developed, well nourished, in no acute distress Head: Normocephalic, atraumatic, sclera non-icteric, no xanthomas, nares are without discharge. Neck: Negative for carotid bruits. JVP not elevated. Lungs: Slightly diminished BS throughout specifically L base and LUL, no wheezes, rales, or rhonchi. Breathing is unlabored. Heart: RRR S1 S2 without murmurs, rubs, or gallops.  Abdomen: Soft, non-tender, non-distended with normoactive bowel sounds. No rebound/guarding. Extremities: No clubbing or cyanosis. No edema. Distal pedal pulses are 2+ and equal bilaterally. Neuro: Alert and oriented X  3. Moves all extremities spontaneously. Psych:  Responds to questions appropriately with a normal affect.  EKG:  The EKG was personally reviewed and demonstrates:    01/04/20: sinus tach 114bpm, baseline wander, nonspecific STT changes, mild ST depression possibly noted in lead III but challenging to assess given baseline wander 7/28: NSR 89bpm, nonspecific TW canges in III, avF   Telemetry:  Telemetry was personally reviewed and demonstrates:  NSR occasional PVCs (rare)  Relevant CV Studies: Echo in progress  Laboratory Data:  High Sensitivity Troponin:   Recent Labs  Lab 01/04/20 1411 01/04/20 1502 01/04/20 2059 01/04/20 2243  TROPONINIHS 209* 299* 173* 341*     Chemistry Recent Labs  Lab 01/04/20 1411 01/05/20 0217  NA 144 142  K 4.2 3.6  CL 99 98  CO2 34* 31  GLUCOSE 107* 101*  BUN 25* 21  CREATININE 1.25* 1.07  CALCIUM 9.2 8.9  GFRNONAA 56* >60  GFRAA >60 >60  ANIONGAP 11 13    Recent Labs  Lab 01/05/20 0217  PROT 5.9*  ALBUMIN 3.1*  AST 25  ALT 21  ALKPHOS 70  BILITOT 0.6   Hematology Recent Labs  Lab 01/04/20 1411 01/05/20 0217  WBC 9.6 8.6  RBC 4.84 4.36  HGB 14.0 12.7*  HCT 44.5 40.7  MCV 91.9 93.3  MCH 28.9 29.1  MCHC 31.5 31.2  RDW 13.4 13.5  PLT 246 237   BNP Recent Labs  Lab 01/04/20 1411  BNP 125.3*    DDimer  Recent Labs  Lab 01/04/20 1411  DDIMER 1.00*     Radiology/Studies:  DG Chest 2 View  Result Date: 01/04/2020 CLINICAL DATA:  Shortness of breath for 2 weeks EXAM: CHEST - 2 VIEW COMPARISON:  September 06, 2019, July 02, 2016 FINDINGS: The cardiomediastinal silhouette is unchanged in contour. Unchanged  small to moderate LEFT pleural effusion. Surgical clips project over the upper chest. No pneumothorax. LEFT basilar homogeneous opacification, likely atelectasis. RIGHT basilar linear opacities, consistent with atelectasis versus scar. Visualized abdomen is unremarkable. Status post posterior fusion of the thoracic  spine. IMPRESSION: Unchanged small to moderate LEFT pleural effusion with favored associated LEFT basilar atelectasis. Electronically Signed   By: Valentino Saxon MD   On: 01/04/2020 13:21   DG Tibia/Fibula Left  Result Date: 01/04/2020 CLINICAL DATA:  Left leg swelling. EXAM: LEFT TIBIA AND FIBULA - 2 VIEW COMPARISON:  None. FINDINGS: Cortical margins of the tibia and fibula are intact. There is no evidence of fracture or other focal bone lesions. Knee and ankle alignment are maintained. There is chondrocalcinosis of the knee. Mild generalized soft tissue edema, nonspecific. IMPRESSION: 1. Mild generalized soft tissue edema. No osseous abnormality. 2. Chondrocalcinosis of the knee. Electronically Signed   By: Keith Rake M.D.   On: 01/04/2020 20:42   CT Angio Chest PE W and/or Wo Contrast  Result Date: 01/04/2020 CLINICAL DATA:  79 year old male with shortness of breath. Concern for pulmonary embolism. Elevated D-dimer. EXAM: CT ANGIOGRAPHY CHEST WITH CONTRAST TECHNIQUE: Multidetector CT imaging of the chest was performed using the standard protocol during bolus administration of intravenous contrast. Multiplanar CT image reconstructions and MIPs were obtained to evaluate the vascular anatomy. CONTRAST:  13m OMNIPAQUE IOHEXOL 350 MG/ML SOLN COMPARISON:  Chest CT dated 09/06/2019 and radiograph dated 01/04/2020. FINDINGS: Cardiovascular: There is no cardiomegaly or pericardial effusion. The thoracic aorta is unremarkable. There is no CT evidence of pulmonary embolism. Mediastinum/Nodes: No hilar or mediastinal adenopathy. The esophagus is grossly unremarkable. Thyroidectomy. No mediastinal fluid collection. Lungs/Pleura: There is a small loculated left pleural effusion similar in size to the prior CT. Postsurgical changes of left upper lobectomy. There is a 6 mm nodule in the superior segment of the left lower lobe (21/6) similar to prior CT. No new consolidative changes. No pneumothorax. The  central airways are patent. Upper Abdomen: Cholecystectomy. Excreted contrast versus a nonobstructing stone in the upper pole of the right kidney. Musculoskeletal: Osteopenia with degenerative changes of the spine and multilevel posterior fusion hardware. No acute osseous pathology. Review of the MIP images confirms the above findings. IMPRESSION: 1. No acute intrathoracic pathology. No CT evidence of pulmonary embolism. 2. Stable postsurgical changes of left upper lobectomy. 3. Stable small loculated left pleural effusion. 4. A 6 mm left lower lobe pulmonary nodule. Attention on follow-up imaging recommended. Electronically Signed   By: AAnner CreteM.D.   On: 01/04/2020 16:45    Assessment and Plan:   1. Shortness of breath  - low level troponin increase of uncertain significance at this time, no chest pain - Covid negative - d-dimer positive - CTA without PE, does note known stable loculated left pleural effusion and [id number] pulmonary nodule that will require follow-up - question whether there is contribution of elevated HR/BP which could potentially be related to abnormal thyroid function - mildly increased RR at times - per d/w MD, await echo result (in progress) before deciding on nuclear stress test vs alternative workup - add ASA [id number] then [id number] - await echo before deciding on whether IV heparin is needed - lipids are extraordinarily controlled despite no therapy (LDL 26)  2. Abnormal thyroid function, prior thyroidectomy and RAI - check free T4 and total T3 - consider adjustment to home levothyroxine  3. Recently treated PNA, prior history of lung CA with above CT  findings - per primary team  4. Essential HTN - per [facility], not taking lisinopril-HCTZ recently - could be contributing to symptoms, will discuss medication adjustments with MD once echo results known  5. Mild AKI - improved with IV fluids  For questions or updates, please contact [place] Please  consult [email or web address] for contact info under    Signed, Charlie Pitter, PA-C  01/05/2020 9:03 AM   I have seen and examined the patient along with Charlie Pitter, PA-C , PA NP.  I have reviewed the chart, notes and new data.  I agree with PA/NP's note.  Key new complaints: dyspnea onset was gradual and he has not had pleurisy, cough, fever/chills or angina Key examination changes: normal CV exam except tachycardia and occasional ectopy Key new findings / data: ECG no ischemic changes, telemetry -sinus tachy and occ PACs, BNP minimally abnormal (probably normal for age), cardiac hsTrop I is persistently elevated, up and down (not consistent with acute coronary insufficiency).  ECHO today (reviewed, I agree with report) 1. Left ventricular ejection fraction, by estimation, is 60 to 65%. The  left ventricle has normal function. The left ventricle has no regional  wall motion abnormalities. There is mild left ventricular hypertrophy of  the posterior segment. Left  ventricular diastolic parameters are consistent with Grade I diastolic  dysfunction (impaired relaxation).  2. Right ventricular systolic function is normal. The right ventricular  size is normal. There is normal pulmonary artery systolic pressure.  3. The mitral valve is normal in structure. Mild mitral valve  regurgitation. No evidence of mitral stenosis.  4. The aortic valve is tricuspid. Aortic valve regurgitation is not  visualized. No aortic stenosis is present.  5. The inferior vena cava is normal in size with greater than 50%  respiratory variability, suggesting right atrial pressure of 3 mmHg.   LE venous duplex (preliminary) No DVT  PLAN: No clear structural heart problem to cause dyspnea. No ischemic changes on ECG, no wall motion abnormalities on echo and no angina. This makes it doubtful that he has a coronary insufficiency-related cause for the elevated troponin, which has a flat pattern. Also note the  complete absence of atherosclerotic calcifications in his coronaries, aorta, aortic arch branches and visceral upper abdominal arteries on CT. Has evidence of iatrogenic hypothyroidism, which could explain his mild tachycardia and frequent ectopy, some of his weight loss, but is unlikely to explain the abnormal troponin level. Will go ahead with the planned nuclear scan, but suspect this will be normal. Consider cardiac MRI to evaluate for myocarditis as a cause of the abnormal troponins, but not sure about the benefit of this evaluation when LVEF and wall motion are so normal. It appears most likely that he has a pulmonary cause for his dyspnea.  Sanda Klein, MD, [place] [phone] 01/05/2020, 12:04 PM

## 2020-01-05 NOTE — Progress Notes (Signed)
The patient has received Lexiscan dose without complications or distress. The appropriate staff was present during this test. Vitals stable

## 2020-01-05 NOTE — Progress Notes (Signed)
PROGRESS NOTE  Devin [name] UUV:[id number] DOB: [birth year] DOA: 01/04/2020 PCP: Prince Solian, MD  Brief History   The patient is a 79 yr old man who presents to [facility] with complaints of shortness of breath x 2 weeks. He saw his PCP who treated him with a course of antibiotics for pneumonia. However, the patient continued to be short of breath and chest pressure. He also complained of left leg swelling.   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 He has been vaccinated for COVID.  In the ED the patient was found to have an elevated troponin at 219 and 299. EKG demonstrated sinus tachycardia with abnormal R wave progression, and low voltage. CTA of the chest demonstrated no acute intrathoracic pathology, No PE, Stable postsurgical changes of left upper lobe lobectomy. Stable small loculated left pleural effusion, 6 mm LLL pulmonary nodule. Follow up imaging is recommended.   Doppler of the left lower extremity was negative for DVT.  [facility] were consulted to admit the patient for further evaluation and care.  The patient was admitted to a step-down bed. Cardiology was consulted.  Echocardiogram was performed. It demonstrated EF of 60-65%, normal LV function with no regional wall motion abnormalities, mild LVH, and grade 1 diastolic dysfunction. No regional wall motion abnormalities.  Cardiology plans for lexiscan stress today.  Consultants  . Cardiology  Procedures  . None  Antibiotics  . None  Subjective  The patient is resting comfortably. No further chest discomfort. Still feels a little short of breath, although he is saturating 95% on room air.  Objective   Vitals:  Vitals:   01/05/20 1428 01/05/20 1430  BP: (!) 154/58 (!) 158/63  Pulse: 102 (!) 125    Resp:    Temp:    SpO2:     Exam:  Constitutional:  . The patient is awake, alert, and oriented x 3. No acute distress. Respiratory:  . No increased work of breathing. . No wheezes, rales, or rhonchi . No tactile fremitus Cardiovascular:  . Regular rate and rhythm . No murmurs, ectopy, or gallups. . No lateral PMI. No thrills. Abdomen:  . Abdomen is soft, non-tender, non-distended . No hernias, masses, or organomegaly . Normoactive bowel sounds.  Musculoskeletal:  . No cyanosis, clubbing, or edema Skin:  . No rashes, lesions, ulcers . palpation of skin: no induration or nodules Neurologic:  . CN 2-12 intact . Sensation all 4 extremities intact Psychiatric:  . Mental status o Mood, affect appropriate o Orientation to person, place, time  . judgment and insight appear intact  I have personally reviewed the following:   Today's Data  . Vitals, CMP, lipid panel, CBC  Cardiology Data  . EKG . Echocardiogram  Scheduled Meds: . (feeding supplement) PROSource Plus  30 mL Oral BID BM  . [START ON 01/06/2020] aspirin EC  81 mg Oral Daily  . atenolol  12.5 mg Oral Daily  . busPIRone  15 mg Oral Daily  . Chlorhexidine Gluconate Cloth  6 each Topical Daily  . docusate sodium  100 mg Oral BID  . enoxaparin (LOVENOX) injection  40 mg Subcutaneous Q24H  . feeding supplement ([employer] STANDARD 1.4)  325 mL Oral BID BM  . insulin aspart  0-9 Units Subcutaneous Q4H  . levothyroxine  200 mcg Oral QAC breakfast  . multivitamin with  minerals  1 tablet Oral Daily  . sodium chloride flush  3 mL Intravenous Q12H   Continuous Infusions:  Active Problems:   DM (diabetes mellitus), type 2 with renal complications (HCC)   Essential hypertension   Prostate cancer with recurrence   Lung nodule   Crohn's disease ([place])   Adenocarcinoma of left lung, stage 1 (HCC)   Hyperlipidemia   Hypothyroid   Small cell lung cancer, left upper lobe (HCC)   NSTEMI (non-ST elevated myocardial  infarction) ([place])   LOS: 0 days   A & P   NSTEMI (non-ST elevated myocardial infarction) ([place]) vs Unstable angina vs hypertensive emergency: The patient presents with intermittent shortness of breath and some dyspnea on exertion. No chest pain in particular. EKG nonischemic but troponin somewhat elevated although downtrending. Appreciate cardiology consult. Echocardiogram only demonstrates Grade I diastolic dysfunction. Lexiscan stress is pending.  Adenocarcinoma of left lung, stage 1 ([place]): Noted. Small cell lung cancer, left upper lobe ([place]). 6 mm nodule in Left lung. Will notify Dr. Julien Nordmann of patient's admission. He will need to have follow up imaging for LLL nodule.  Crohn's disease ([place]) -chronic stable continue Asacol when able   Essential hypertension: Blood pressures are running high. Will continue old ACE inhibitor, but hold HCTZ.  Resume atenolol as patient noted to be tachycardic and hypertensive which is likely in the setting of beta-blocker withdrawal.Continue atenolol and consider increasing dose given the patient's elevated heart rate and blood pressures. One possible explanation of elevated troponins could be hypertensive emergency as blood pressures were quite elevated on admission.   Hyperlipidemia:  Chronic. Lipid panel demonstrated LDL of 26. The patient is not on a statin at home.  As per cardiology/primary.  Hypothyroid: Iatrogenic following RAI and thyroidectomy.  Continue synthroid 200 mcg daily as at home.  DM II (diabetes mellitus): with renal complications: Hemoglobin A1c is 6.3. Glucoses will be managed with FSBS and SSI.  Monitor.   Lung nodule: Oncology notified of the patient's admission. Nodule is 72m in the LLL. Will need to be followed by imaging on discharge. History of lung cancer.   Prostate cancer with recurrence on Lupron:  Lupron held. Can be resumed as an outpatient.   Left lower extremity swelling.  Doppler of the left lower extremity was  negative for DVT.  I have seen and examined this patient myself. I have spent 35 minutes in his evaluation and care.  Jerrod Damiano, DO [facility] Direct contact: see [email or web address]  7PM-7AM contact night coverage as above 01/05/2020, 2:46 PM  LOS: 0 days

## 2020-01-05 NOTE — Progress Notes (Signed)
Initial Nutrition Assessment  DOCUMENTATION CODES:   Not applicable  INTERVENTION:  - diet advancement as medically feasible. - will order Dillard [place] 1.4 po BID, each supplement provides 455 kcal and 20 grams protein. - will order 30 mL Prosource Plus BID, each supplement provides 100 kcal and 15 grams of protein. - will order 1 tablet multivitamin with minerals/day. - will complete NFPE at follow-up.  NUTRITION DIAGNOSIS:   Increased nutrient needs related to acute illness, chronic illness, cancer and cancer related treatments as evidenced by estimated needs.  GOAL:   Patient will meet greater than or equal to 90% of their needs  MONITOR:   Diet advancement, PO intake, Supplement acceptance, Labs, Weight trends  REASON FOR ASSESSMENT:   Malnutrition Screening Tool, Consult Malnutrition Eval  ASSESSMENT:   79 y.o. male with medical history of non-small cell lung cancer, ankylosing spondylitis, Crohn's disease, HTN, hypothyroidism, prostate cancer, thyroid cancer s/p RAI and total thyroidectomy, and type 2 DM. He presented to the ED with 2 week hx of SOB and reported that PCP had recently dx him with PNA and that he finished course of abx and was still feeling SOB.  Unable to see patient earlier today as several other disciplines were in to see him throughout the morning and then patient was taken to [facility] within the past hour for stress test.   Unable to perform NFPE to obtain PTA nutrition-related information. Patient has not been seen by a [facility] since 2017.  Weight yesterday was documented as 180 lb, which appears to be a stated weight. PTA, the most recently documented weight was on 09/08/19 when patient weighed 194 lb. This indicates 14 lb weight loss (7.2% body weight in the past 4 months; not significant for time frame.  Per notes: - NSTEMI - stage 1 adenocarcinoma of L lung - prostate cancer - LLE edema - plan for PT/OT prior to d/c   Labs reviewed; CBGs:  84, 84, and 78 mg/dl. Medications reviewed; 100 mg colace BID, sliding scale novolog, 200 mcg oral synthroid/day.     NUTRITION - FOCUSED PHYSICAL EXAM:  unable to complete at this time.   Diet Order:   Diet Order            Diet NPO time specified  Diet effective midnight                 EDUCATION NEEDS:   Not appropriate for education at this time  Skin:  Skin Assessment: Reviewed RN Assessment  Last BM:  PTA/unknown  Height:   Ht Readings from Last 1 Encounters:  01/04/20 5' 10"  (1.778 m)    Weight:   Wt Readings from Last 1 Encounters:  01/04/20 81.6 kg     Estimated Nutritional Needs:  Kcal:  2040-2280 kcal Protein:  105-115 grams Fluid:  >/= 2 L/day     Jarome Matin, MS, RD, LDN, CNSC Inpatient Clinical Dietitian RD pager # available in AMION  After hours/weekend pager # available in [facility]

## 2020-01-06 DIAGNOSIS — I21A1 Myocardial infarction type 2: Secondary | ICD-10-CM | POA: Diagnosis not present

## 2020-01-06 DIAGNOSIS — R911 Solitary pulmonary nodule: Secondary | ICD-10-CM | POA: Diagnosis not present

## 2020-01-06 DIAGNOSIS — I10 Essential (primary) hypertension: Secondary | ICD-10-CM | POA: Diagnosis not present

## 2020-01-06 DIAGNOSIS — I214 Non-ST elevation (NSTEMI) myocardial infarction: Secondary | ICD-10-CM | POA: Diagnosis not present

## 2020-01-06 DIAGNOSIS — R778 Other specified abnormalities of plasma proteins: Secondary | ICD-10-CM | POA: Diagnosis not present

## 2020-01-06 DIAGNOSIS — R0902 Hypoxemia: Secondary | ICD-10-CM | POA: Diagnosis not present

## 2020-01-06 DIAGNOSIS — C3490 Malignant neoplasm of unspecified part of unspecified bronchus or lung: Secondary | ICD-10-CM | POA: Diagnosis not present

## 2020-01-06 DIAGNOSIS — C61 Malignant neoplasm of prostate: Secondary | ICD-10-CM | POA: Diagnosis not present

## 2020-01-06 DIAGNOSIS — I160 Hypertensive urgency: Secondary | ICD-10-CM | POA: Diagnosis not present

## 2020-01-06 DIAGNOSIS — C3412 Malignant neoplasm of upper lobe, left bronchus or lung: Secondary | ICD-10-CM | POA: Diagnosis not present

## 2020-01-06 LAB — CBC
HCT: 40 % (ref 39.0–52.0)
Hemoglobin: 12.9 g/dL — ABNORMAL LOW (ref 13.0–17.0)
MCH: 29.1 pg (ref 26.0–34.0)
MCHC: 32.3 g/dL (ref 30.0–36.0)
MCV: 90.3 fL (ref 80.0–100.0)
Platelets: 243 10*3/uL (ref 150–400)
RBC: 4.43 MIL/uL (ref 4.22–5.81)
RDW: 13.2 % (ref 11.5–15.5)
WBC: 8.4 10*3/uL (ref 4.0–10.5)
nRBC: 0 % (ref 0.0–0.2)

## 2020-01-06 LAB — BASIC METABOLIC PANEL
Anion gap: 11 (ref 5–15)
BUN: 20 mg/dL (ref 8–23)
CO2: 30 mmol/L (ref 22–32)
Calcium: 8.7 mg/dL — ABNORMAL LOW (ref 8.9–10.3)
Chloride: 99 mmol/L (ref 98–111)
Creatinine, Ser: 1.06 mg/dL (ref 0.61–1.24)
GFR calc Af Amer: >60 mL/min (ref >60)
GFR calc non Af Amer: >60 mL/min (ref >60)
Glucose, Bld: 157 mg/dL — ABNORMAL HIGH (ref 70–99)
Potassium: 3.4 mmol/L — ABNORMAL LOW (ref 3.5–5.1)
Sodium: 140 mmol/L (ref 135–145)

## 2020-01-06 LAB — GLUCOSE, CAPILLARY
Glucose-Capillary: 134 mg/dL — ABNORMAL HIGH (ref 70–99)
Glucose-Capillary: 136 mg/dL — ABNORMAL HIGH (ref 70–99)

## 2020-01-06 LAB — TROPONIN I (HIGH SENSITIVITY): Troponin I (High Sensitivity): 105 ng/L (ref <18)

## 2020-01-06 LAB — T3: T3, Total: 114 ng/dL (ref 71–180)

## 2020-01-06 MED ORDER — LABETALOL HCL 5 MG/ML IV SOLN
10.0000 mg | Freq: Once | INTRAVENOUS | Status: AC
  Administered 2020-01-06: 10 mg via INTRAVENOUS
  Filled 2020-01-06: qty 4

## 2020-01-06 MED ORDER — PROSOURCE PLUS PO LIQD: 30.0000 mL | Freq: Two times a day (BID) | ORAL | 0 refills | Status: DC

## 2020-01-06 MED ORDER — LEVOTHYROXINE SODIUM 200 MCG PO TABS: 100.0000 ug | ORAL_TABLET | Freq: Every day | ORAL | 0 refills | Status: DC

## 2020-01-06 MED ORDER — KATE FARMS STANDARD 1.4 PO LIQD: 325.0000 mL | Freq: Two times a day (BID) | ORAL | 0 refills | Status: DC

## 2020-01-06 MED ORDER — HYDRALAZINE HCL 25 MG PO TABS
25.0000 mg | ORAL_TABLET | Freq: Four times a day (QID) | ORAL | Status: DC
  Administered 2020-01-06: 25 mg via ORAL
  Filled 2020-01-06: qty 1

## 2020-01-06 MED ORDER — ASPIRIN 81 MG PO TBEC: 81.0000 mg | DELAYED_RELEASE_TABLET | Freq: Every day | ORAL | 11 refills | Status: DC

## 2020-01-06 MED ORDER — HYDRALAZINE HCL 25 MG PO TABS: 25.0000 mg | ORAL_TABLET | Freq: Four times a day (QID) | ORAL | 0 refills | Status: DC

## 2020-01-06 MED ORDER — DOCUSATE SODIUM 100 MG PO CAPS: 100.0000 mg | ORAL_CAPSULE | Freq: Two times a day (BID) | ORAL | 0 refills | Status: DC

## 2020-01-06 NOTE — Discharge Instructions (Signed)
Shortness of Breath, Adult Shortness of breath means you have trouble breathing. Shortness of breath could be a sign of a medical problem. Follow these instructions at home:   Watch for any changes in your symptoms.  Do not use any products that contain nicotine or tobacco, such as cigarettes, e-cigarettes, and chewing tobacco.  Do not smoke. Smoking can cause shortness of breath. If you need help to quit smoking, ask your doctor.  Avoid things that can make it harder to breathe, such as: ? Mold. ? Dust. ? Air pollution. ? Chemical smells. ? Things that can cause allergy symptoms (allergens), if you have allergies.  Keep your living space clean. Use products that help remove mold and dust.  Rest as needed. Slowly return to your normal activities.  Take over-the-counter and prescription medicines only as told by your doctor. This includes oxygen therapy and inhaled medicines.  Keep all follow-up visits as told by your doctor. This is important. Contact a doctor if:  Your condition does not get better as soon as expected.  You have a hard time doing your normal activities, even after you rest.  You have new symptoms. Get help right away if:  Your shortness of breath gets worse.  You have trouble breathing when you are resting.  You feel light-headed or you pass out (faint).  You have a cough that is not helped by medicines.  You cough up blood.  You have pain with breathing.  You have pain in your chest, arms, shoulders, or belly (abdomen).  You have a fever.  You cannot walk up stairs.  You cannot exercise the way you normally do. These symptoms may represent a serious problem that is an emergency. Do not wait to see if the symptoms will go away. Get medical help right away. Call your local emergency services (911 in the [place] Do not drive yourself to the hospital. Summary  Shortness of breath is when you have trouble breathing enough air. It can be a sign of a  medical problem.  Avoid things that make it hard for you to breathe, such as smoking, pollution, mold, and dust.  Watch for any changes in your symptoms. Contact your doctor if you do not get better or you get worse. This information is not intended to replace advice given to you by your health care provider. Make sure you discuss any questions you have with your health care provider. Document Revised: 10/27/2017 Document Reviewed: 10/27/2017 Elsevier Patient Education  2020 [employer]  

## 2020-01-06 NOTE — Progress Notes (Signed)
Progress Note  Patient Name: Devin [name] Date of Encounter: 01/06/2020  [facility] Cardiologist: Sanda Klein, MD   Subjective   Dyspnea walking with PT, but otherwise no CV complaints.  Inpatient Medications    Scheduled Meds: . (feeding supplement) PROSource Plus  30 mL Oral BID BM  . aspirin EC  81 mg Oral Daily  . atenolol  12.5 mg Oral Daily  . busPIRone  15 mg Oral Daily  . Chlorhexidine Gluconate Cloth  6 each Topical Daily  . docusate sodium  100 mg Oral BID  . enoxaparin (LOVENOX) injection  40 mg Subcutaneous Q24H  . feeding supplement (K[employer] STANDARD 1.4)  325 mL Oral BID BM  . insulin aspart  0-5 Units Subcutaneous QHS  . insulin aspart  0-9 Units Subcutaneous TID WC  . multivitamin with minerals  1 tablet Oral Daily  . sodium chloride flush  3 mL Intravenous Q12H   Continuous Infusions:  PRN Meds: acetaminophen **OR** acetaminophen, albuterol, diphenhydrAMINE, HYDROcodone-acetaminophen, ondansetron **OR** ondansetron (ZOFRAN) IV   Vital Signs    Vitals:   01/06/20 0512 01/06/20 0514 01/06/20 0600 01/06/20 0800  BP: (!) 189/92 (!) 185/98 (!) 154/65   Pulse: 86 78 66   Resp: 21 (!) 29 (!) 24   Temp:    97.6 F (36.4 C)  TempSrc:    Oral  SpO2: 94% 95% (!) 87%   Weight:      Height:       No intake or output data in the 24 hours ending 01/06/20 1103 Last 3 Weights 01/04/2020 09/08/2019 03/02/2018  Weight (lbs) 180 lb 194 lb 3.2 oz 205 lb 9.6 oz  Weight (kg) 81.647 kg 88.089 kg 93.26 kg      Telemetry    NSR - Personally Reviewed  ECG    Repeat tracing this AM is normal. NSR. - Personally Reviewed  Physical Exam  Looks comfortable lying almost flat in bed GEN: No acute distress.   Neck: No JVD Cardiac: RRR, no murmurs, rubs, or gallops.  Respiratory: Reduced breath sounds left base; otherwise clear to auscultation bilaterally. GI: Soft, nontender, non-distended  MS: No edema; No deformity. Neuro:  Nonfocal  Psych: Normal  affect   Labs    High Sensitivity Troponin:   Recent Labs  Lab 01/04/20 1411 01/04/20 1502 01/04/20 2059 01/04/20 2243 01/06/20 0620  TROPONINIHS 209* 299* 173* 341* 105*      Chemistry Recent Labs  Lab 01/04/20 1411 01/05/20 0217 01/06/20 0224  NA 144 142 140  K 4.2 3.6 3.4*  CL 99 98 99  CO2 34* 31 30  GLUCOSE 107* 101* 157*  BUN 25* 21 20  CREATININE 1.25* 1.07 1.06  CALCIUM 9.2 8.9 8.7*  PROT  --  5.9*  --   ALBUMIN  --  3.1*  --   AST  --  25  --   ALT  --  21  --   ALKPHOS  --  70  --   BILITOT  --  0.6  --   GFRNONAA 56* >60 >60  GFRAA >60 >60 >60  ANIONGAP 11 13 11      Hematology Recent Labs  Lab 01/04/20 1411 01/05/20 0217 01/06/20 0224  WBC 9.6 8.6 8.4  RBC 4.84 4.36 4.43  HGB 14.0 12.7* 12.9*  HCT 44.5 40.7 40.0  MCV 91.9 93.3 90.3  MCH 28.9 29.1 29.1  MCHC 31.5 31.2 32.3  RDW 13.4 13.5 13.2  PLT 246 237 243  BNP Recent Labs  Lab 01/04/20 1411  BNP 125.3*     DDimer  Recent Labs  Lab 01/04/20 1411  DDIMER 1.00*     Radiology    DG Chest 2 View  Result Date: 01/04/2020 CLINICAL DATA:  Shortness of breath for 2 weeks EXAM: CHEST - 2 VIEW COMPARISON:  September 06, 2019, July 02, 2016 FINDINGS: The cardiomediastinal silhouette is unchanged in contour. Unchanged small to moderate LEFT pleural effusion. Surgical clips project over the upper chest. No pneumothorax. LEFT basilar homogeneous opacification, likely atelectasis. RIGHT basilar linear opacities, consistent with atelectasis versus scar. Visualized abdomen is unremarkable. Status post posterior fusion of the thoracic spine. IMPRESSION: Unchanged small to moderate LEFT pleural effusion with favored associated LEFT basilar atelectasis. Electronically Signed   By: Valentino Saxon MD   On: 01/04/2020 13:21   DG Tibia/Fibula Left  Result Date: 01/04/2020 CLINICAL DATA:  Left leg swelling. EXAM: LEFT TIBIA AND FIBULA - 2 VIEW COMPARISON:  None. FINDINGS: Cortical margins of the  tibia and fibula are intact. There is no evidence of fracture or other focal bone lesions. Knee and ankle alignment are maintained. There is chondrocalcinosis of the knee. Mild generalized soft tissue edema, nonspecific. IMPRESSION: 1. Mild generalized soft tissue edema. No osseous abnormality. 2. Chondrocalcinosis of the knee. Electronically Signed   By: Keith Rake M.D.   On: 01/04/2020 20:42   CT Angio Chest PE W and/or Wo Contrast  Result Date: 01/04/2020 CLINICAL DATA:  79 year old male with shortness of breath. Concern for pulmonary embolism. Elevated D-dimer. EXAM: CT ANGIOGRAPHY CHEST WITH CONTRAST TECHNIQUE: Multidetector CT imaging of the chest was performed using the standard protocol during bolus administration of intravenous contrast. Multiplanar CT image reconstructions and MIPs were obtained to evaluate the vascular anatomy. CONTRAST:  155m OMNIPAQUE IOHEXOL 350 MG/ML SOLN COMPARISON:  Chest CT dated 09/06/2019 and radiograph dated 01/04/2020. FINDINGS: Cardiovascular: There is no cardiomegaly or pericardial effusion. The thoracic aorta is unremarkable. There is no CT evidence of pulmonary embolism. Mediastinum/Nodes: No hilar or mediastinal adenopathy. The esophagus is grossly unremarkable. Thyroidectomy. No mediastinal fluid collection. Lungs/Pleura: There is a small loculated left pleural effusion similar in size to the prior CT. Postsurgical changes of left upper lobectomy. There is a 6 mm nodule in the superior segment of the left lower lobe (21/6) similar to prior CT. No new consolidative changes. No pneumothorax. The central airways are patent. Upper Abdomen: Cholecystectomy. Excreted contrast versus a nonobstructing stone in the upper pole of the right kidney. Musculoskeletal: Osteopenia with degenerative changes of the spine and multilevel posterior fusion hardware. No acute osseous pathology. Review of the MIP images confirms the above findings. IMPRESSION: 1. No acute  intrathoracic pathology. No CT evidence of pulmonary embolism. 2. Stable postsurgical changes of left upper lobectomy. 3. Stable small loculated left pleural effusion. 4. A 6 mm left lower lobe pulmonary nodule. Attention on follow-up imaging recommended. Electronically Signed   By: AAnner CreteM.D.   On: 01/04/2020 16:45   NM Myocar Multi W/Spect W/Wall Motion / EF  Result Date: 01/05/2020  There was no ST segment deviation noted during stress.  Nuclear stress EF: 69%.  The left ventricular ejection fraction is hyperdynamic (>65%).  The study is normal.  This is a low risk study.    ECHOCARDIOGRAM COMPLETE  Result Date: 01/05/2020    ECHOCARDIOGRAM REPORT   Patient Name:   CSHADEN LACHERDate of Exam: 01/05/2020 Medical Rec #:  [id number]  Height:       70.0 in Accession #:    [id number]        Weight:       180.0 lb Date of Birth:  Sep 20, 1940         BSA:          1.996 m Patient Age:    71 years          BP:           167/71 mmHg Patient Gender: M                 HR:           91 bpm. Exam Location:  Inpatient Procedure: 2D Echo, Cardiac Doppler and Color Doppler Indications:    Elevated Troponin  History:        Patient has no prior history of Echocardiogram examinations.                 Risk Factors:Hypertension and Diabetes. Cancer. Hypothyroidism.  Sonographer:    Jonelle Sidle Dance Referring Phys: [id number] ANASTASSIA DOUTOVA  Sonographer Comments: Suboptimal subcostal window. IMPRESSIONS  1. Left ventricular ejection fraction, by estimation, is 60 to 65%. The left ventricle has normal function. The left ventricle has no regional wall motion abnormalities. There is mild left ventricular hypertrophy of the posterior segment. Left ventricular diastolic parameters are consistent with Grade I diastolic dysfunction (impaired relaxation).  2. Right ventricular systolic function is normal. The right ventricular size is normal. There is normal pulmonary artery systolic pressure.  3. The mitral valve  is normal in structure. Mild mitral valve regurgitation. No evidence of mitral stenosis.  4. The aortic valve is tricuspid. Aortic valve regurgitation is not visualized. No aortic stenosis is present.  5. The inferior vena cava is normal in size with greater than 50% respiratory variability, suggesting right atrial pressure of 3 mmHg. FINDINGS  Left Ventricle: Left ventricular ejection fraction, by estimation, is 60 to 65%. The left ventricle has normal function. The left ventricle has no regional wall motion abnormalities. The left ventricular internal cavity size was normal in size. There is  mild left ventricular hypertrophy of the posterior segment. Left ventricular diastolic parameters are consistent with Grade I diastolic dysfunction (impaired relaxation). Indeterminate filling pressures. Right Ventricle: The right ventricular size is normal. No increase in right ventricular wall thickness. Right ventricular systolic function is normal. There is normal pulmonary artery systolic pressure. The tricuspid regurgitant velocity is 2.40 m/s, and  with an assumed right atrial pressure of 10 mmHg, the estimated right ventricular systolic pressure is 64.3 mmHg. Left Atrium: Left atrial size was normal in size. Right Atrium: Right atrial size was normal in size. Pericardium: Trivial pericardial effusion is present. Mitral Valve: The mitral valve is normal in structure. Normal mobility of the mitral valve leaflets. Mild mitral valve regurgitation. No evidence of mitral valve stenosis. Tricuspid Valve: The tricuspid valve is normal in structure. Tricuspid valve regurgitation is trivial. No evidence of tricuspid stenosis. Aortic Valve: The aortic valve is tricuspid. . There is mild thickening and mild calcification of the aortic valve. Aortic valve regurgitation is not visualized. No aortic stenosis is present. There is mild thickening of the aortic valve. There is mild calcification of the aortic valve. Pulmonic Valve: The  pulmonic valve was normal in structure. Pulmonic valve regurgitation is not visualized. No evidence of pulmonic stenosis. Aorta: The aortic root is normal in size and structure. Venous: The inferior vena cava is normal in size  with greater than 50% respiratory variability, suggesting right atrial pressure of 3 mmHg. IAS/Shunts: No atrial level shunt detected by color flow Doppler.  LEFT VENTRICLE PLAX 2D LVIDd:         4.40 cm  Diastology LVIDs:         3.50 cm  LV e' lateral:   6.96 cm/s LV PW:         1.20 cm  LV E/e' lateral: 11.6 LV IVS:        1.00 cm  LV e' medial:    6.31 cm/s LVOT diam:     2.00 cm  LV E/e' medial:  12.8 LV SV:         56 LV SV Index:   28 LVOT Area:     3.14 cm  RIGHT VENTRICLE             IVC RV Basal diam:  2.20 cm     IVC diam: 2.00 cm RV S prime:     12.00 cm/s TAPSE (M-mode): 2.0 cm LEFT ATRIUM             Index       RIGHT ATRIUM          Index LA diam:        3.20 cm 1.60 cm/m  RA Area:     9.65 cm LA Vol (A2C):   55.3 ml 27.71 ml/m RA Volume:   16.20 ml 8.12 ml/m LA Vol (A4C):   32.3 ml 16.18 ml/m LA Biplane Vol: 43.1 ml 21.59 ml/m  AORTIC VALVE LVOT Vmax:   98.90 cm/s LVOT Vmean:  62.900 cm/s LVOT VTI:    0.178 m  AORTA Ao Root diam: 3.40 cm Ao Asc diam:  3.00 cm MITRAL VALVE               TRICUSPID VALVE MV Area (PHT): 3.77 cm    TR Peak grad:   23.0 mmHg MV Decel Time: 201 msec    TR Vmax:        240.00 cm/s MV E velocity: 80.50 cm/s MV A velocity: 89.30 cm/s  SHUNTS MV E/A ratio:  0.90        Systemic VTI:  0.18 m                            Systemic Diam: 2.00 cm Skeet Latch MD Electronically signed by Skeet Latch MD Signature Date/Time: 01/05/2020/11:24:59 AM    Final    VAS [place] LOWER EXTREMITY VENOUS (DVT)  Result Date: 01/05/2020  Lower Venous DVTStudy Indications: Edema.  Risk Factors: None identified. Comparison Study: No prior studies. Performing Technologist: Oliver Hum RVT  Examination Guidelines: A complete evaluation includes B-mode imaging,  spectral Doppler, color Doppler, and power Doppler as needed of all accessible portions of each vessel. Bilateral testing is considered an integral part of a complete examination. Limited examinations for reoccurring indications may be performed as noted. The reflux portion of the exam is performed with the patient in reverse Trendelenburg.  +-----+---------------+---------+-----------+----------+--------------+ RIGHTCompressibilityPhasicitySpontaneityPropertiesThrombus Aging +-----+---------------+---------+-----------+----------+--------------+ CFV  Full           Yes      Yes                                 +-----+---------------+---------+-----------+----------+--------------+   +---------+---------------+---------+-----------+----------+--------------+ LEFT     CompressibilityPhasicitySpontaneityPropertiesThrombus Aging +---------+---------------+---------+-----------+----------+--------------+ CFV  Full           Yes      Yes                                 +---------+---------------+---------+-----------+----------+--------------+ SFJ      Full                                                        +---------+---------------+---------+-----------+----------+--------------+ FV Prox  Full                                                        +---------+---------------+---------+-----------+----------+--------------+ FV Mid   Full                                                        +---------+---------------+---------+-----------+----------+--------------+ FV DistalFull                                                        +---------+---------------+---------+-----------+----------+--------------+ PFV      Full                                                        +---------+---------------+---------+-----------+----------+--------------+ POP      Full           Yes      Yes                                  +---------+---------------+---------+-----------+----------+--------------+ PTV      Full                                                        +---------+---------------+---------+-----------+----------+--------------+ PERO     Full                                                        +---------+---------------+---------+-----------+----------+--------------+     Summary: RIGHT: - No evidence of common femoral vein obstruction.  LEFT: - There is no evidence of deep vein thrombosis in the lower extremity.  - No cystic structure found in the popliteal fossa.  *See table(s) above for measurements and observations. Electronically signed by Curt Jews MD on 01/05/2020 at 5:15:58 PM.  Final     Cardiac Studies   ECHO and Nuclear stress as above  Patient Profile     79 y.o. 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  Assessment & Plan    Echo and nuclear stress test show normal LV regional wall motion and systolic function and normal perfusion. No angina and normal ECG. No atherosclerotic calcifications seen in coronaries, aorta and branches of abdominal visceral arteries on CT. CTA negative for pulmonary embolism. ESR and CRP are normal, making myocarditis highly unlikely. Troponin has been oscillating up and down, now with an apparent trend to resolution of abnormality.  Many of his complaints may be explained by hyperthyroidism. Dyspnea is likely due to his lung problems. I have no clear explanation for the elevation in troponin, but would not recommend any additional testing at this time.  [facility] will sign off.   Medication Recommendations:  Resume lisinopril, without the HCTZ (hypokalemia  and increased creatinine on arrival). Continue low dose beta blocker until he is euthyroid. Other recommendations (labs, testing, etc):  F/u TFTs in several weeks Follow up as an outpatient:  Will arrange Cardiology f/u w Dr. Harrington Challenger (patient request)  For questions or updates, please contact [place] Please consult [email or web address] for contact info under        Signed, Sanda Klein, MD  01/06/2020, 11:03 AM

## 2020-01-06 NOTE — Evaluation (Addendum)
Occupational Therapy Evaluation Patient Details Name: Devin [name] MRN: [id number] DOB: 10/21/1940 Today's Date: 01/06/2020    History of Present Illness The patient is a 79 yr old man who presents to [facility] 01/04/20 with complaints of shortness of breath x 2 weeks. Treated with a course of antibiotics for pneumonia.Admitted with persistent SOB and  left leg swelling.The patient has a past medical history significant for non-small cell lung CA, Ankylosing spondylitis, Crohn's disease, Hypertension, Hypothyroidism, Prostate CA, Thyroid CA S/P total  thyroidectomy),  and DM II, S/P  thoracic fusion. He has been vaccinated for COVID.   Clinical Impression   On evaluation [name] presents with normal ROM, strength and coordination of upper extremities. Patient demonstrates the functional ability to perform mobility and ADLs with out difficulty or loss of balance. Patient able to perform 5 sit to stands without complaints of shortness of breath or significant change in vitals. Patient does report feeling weaker than normal and getting short of breath with walk in hall. OT educated patient to use shower chair in walk in shower at home for safety and energy conservation principles. Patient verbalized understanding. No further OT needs.    Follow Up Recommendations  No OT follow up    Equipment Recommendations  None recommended by OT    Recommendations for Other Services       Precautions / Restrictions Precautions Precautions: None Precaution Comments: monitor VS      Mobility Bed Mobility Overal bed mobility: Independent                Transfers Overall transfer level: Independent               General transfer comment: Patient able to perform 5 sit to stands with hands on knees without complaints of shortness of breath or fatigue. VS maintained at normal limits. Reports ambulating in hall with PT and reports "a litt'e" shortness of breath.    Balance Overall  balance assessment: No apparent balance deficits (not formally assessed)                                         ADL either performed or assessed with clinical judgement   ADL Overall ADL's : Independent                                             Vision   Vision Assessment?: No apparent visual deficits     Perception     Praxis      Pertinent Vitals/Pain Pain Assessment: No/denies pain     Hand Dominance Right   Extremity/Trunk Assessment Upper Extremity Assessment Upper Extremity Assessment: RUE deficits/detail;LUE deficits/detail RUE Deficits / Details: 5/5 strength throughout LUE Deficits / Details: 5/5 throughout   Lower Extremity Assessment Lower Extremity Assessment: Defer to PT evaluation   Cervical / Trunk Assessment Cervical / Trunk Assessment: Normal   Communication Communication Communication: No difficulties   Cognition Arousal/Alertness: Awake/alert Behavior During Therapy: WFL for tasks assessed/performed Overall Cognitive Status: Within Functional Limits for tasks assessed                                     General Comments  Exercises     Shoulder Instructions      Home Living Family/patient expects to be discharged to:: Private residence Living Arrangements: Spouse/significant other Available Help at Discharge: Family;Available 24 hours/day Type of Home: House Home Access: Stairs to enter [employer] of Steps: 3 Entrance Stairs-Rails: Right Home Layout: One level     Bathroom Shower/Tub: Occupational psychologist: Handicapped height Bathroom Accessibility: Yes   Home Equipment: Environmental consultant - 2 wheels;Bedside commode          Prior Functioning/Environment Level of Independence: Independent                 OT Problem List:        OT Treatment/Interventions:      OT Goals(Current goals can be found in the care plan section) Acute Rehab OT  Goals Patient Stated Goal: to get up. go home OT Goal Formulation: All assessment and education complete, DC therapy  OT Frequency:     Barriers to D/C:            Co-evaluation              AM-PAC OT "6 Clicks" Daily Activity     Outcome Measure Help from another person eating meals?: None Help from another person taking care of personal grooming?: None Help from another person toileting, which includes using toliet, bedpan, or urinal?: None Help from another person bathing (including washing, rinsing, drying)?: None Help from another person to put on and taking off regular upper body clothing?: None Help from another person to put on and taking off regular lower body clothing?: None 6 Click Score: 24   End of Session Nurse Communication:  (okay to see per RN)  Activity Tolerance: Patient tolerated treatment well Patient left: in chair;with call bell/phone within reach  OT Visit Diagnosis: Muscle weakness (generalized) (M62.81)                Time: 7628-3151 OT Time Calculation (min): 10 min Charges:  OT General Charges $OT Visit: 1 Visit OT Evaluation $OT Eval Low Complexity: 1 Low  Devin [name], OTR/L [place]  Office [phone] Pager: [phone]   Devin [name] 01/06/2020, 11:35 AM

## 2020-01-06 NOTE — Evaluation (Signed)
Physical Therapy Evaluation Patient Details Name: Devin [name] MRN: [id number] DOB: 1941-04-15 Today's Date: 01/06/2020   History of Present Illness  The patient is a 79 yr old man who presents to [facility] 01/04/20 with complaints of shortness of breath x 2 weeks. Treated with a course of antibiotics for pneumonia.Admitted with persistent SOB and  left leg swelling.The patient has a past medical history significant for non-small cell lung CA, Ankylosing spondylitis, Crohn's disease, Hypertension, Hypothyroidism, Prostate CA, Thyroid CA S/P total  thyroidectomy),  and DM II, S/P  thoracic fusion. He has been vaccinated for COVID.  Clinical Impression  The patient noted up ad lib in room. Patient did ambulate x 240' on RA, HR 100, RR 40(dyspnea 3/4), SPO2 95%. Patient should progress to Dc home. Pt admitted with above diagnosis.  Pt currently with functional limitations due to the deficits listed below (see PT Problem List). Pt will benefit from skilled PT to increase their independence and safety with mobility to allow discharge to the venue listed below.       Follow Up Recommendations No PT follow up    Equipment Recommendations  None recommended by PT    Recommendations for Other Services       Precautions / Restrictions Precautions Precaution Comments: monitor VS      Mobility  Bed Mobility Overal bed mobility: Independent                Transfers Overall transfer level: Independent                  Ambulation/Gait Ambulation/Gait assistance: Supervision Gait Distance (Feet): 240 Feet Assistive device: None Gait Pattern/deviations: Step-through pattern Gait velocity: decr- dyspnea 3/4 Gait velocity interpretation: 1.31 - 2.62 ft/sec, indicative of limited community ambulator General Gait Details: no LOB noted  Stairs            Wheelchair Mobility    Modified Rankin (Stroke Patients Only)       Balance Overall balance assessment: No apparent  balance deficits (not formally assessed)                                           Pertinent Vitals/Pain      Home Living Family/patient expects to be discharged to:: Private residence Living Arrangements: Spouse/significant other Available Help at Discharge: Family;Available 24 hours/day Type of Home: House Home Access: Stairs to enter Entrance Stairs-Rails: Right Entrance Stairs-Number of Steps: 3 Home Layout: One level Home Equipment: Walker - 2 wheels;Bedside commode      Prior Function Level of Independence: Independent               Hand Dominance   Dominant Hand: Right    Extremity/Trunk Assessment        Lower Extremity Assessment Lower Extremity Assessment: Overall WFL for tasks assessed       Communication   Communication: No difficulties  Cognition Arousal/Alertness: Awake/alert Behavior During Therapy: WFL for tasks assessed/performed Overall Cognitive Status: Within Functional Limits for tasks assessed                                        General Comments      Exercises     Assessment/Plan    PT Assessment Patient needs continued PT services  PT Problem  List Decreased strength;Decreased mobility;Decreased activity tolerance;Cardiopulmonary status limiting activity       PT Treatment Interventions Gait training;Functional mobility training;Therapeutic activities;Patient/family education    PT Goals (Current goals can be found in the Care Plan section)  Acute Rehab PT Goals Patient Stated Goal: to get up. go home PT Goal Formulation: With patient Time For Goal Achievement: 01/06/20 Potential to Achieve Goals: Good    Frequency Min 3X/week   Barriers to discharge        Co-evaluation               AM-PAC PT "6 Clicks" Mobility  Outcome Measure Help needed turning from your back to your side while in a flat bed without using bedrails?: None Help needed moving from lying on your back  to sitting on the side of a flat bed without using bedrails?: None Help needed moving to and from a bed to a chair (including a wheelchair)?: None Help needed standing up from a chair using your arms (e.g., wheelchair or bedside chair)?: None Help needed to walk in hospital room?: None Help needed climbing 3-5 steps with a railing? : A Little 6 Click Score: 23    End of Session   Activity Tolerance: Patient tolerated treatment well Patient left: in chair;with call bell/phone within reach Nurse Communication: Mobility status PT Visit Diagnosis: Difficulty in walking, not elsewhere classified (R26.2)    Time: 6825-7493 PT Time Calculation (min) (ACUTE ONLY): 21 min   Charges:   PT Evaluation $PT Eval Low Complexity: [place] Pager [phone] Office [phone]  Claretha Cooper 01/06/2020, 8:48 AM

## 2020-01-06 NOTE — Discharge Summary (Signed)
Physician Discharge Summary  JEWELL RYANS LFY:[id number] DOB: 03/23/1941 DOA: 01/04/2020  PCP: Prince Solian, MD  Admit date: 01/04/2020 Discharge date: 01/06/2020  Recommendations for Outpatient Follow-up:  1. Discharge to home 2. Follow up with PCP in 7-10 days. Have CBC and chemistry drawn at this visit. 3. Follow up with gastroenterology as directed. 4. Follow up with Dr. Julien Nordmann as directed. 5. Follow up with Pulmonology in 2 weeks. 6. Follow up with Cardiology on 02/07/2020.   Follow-up Information    Richmond Campbell, MD Follow up.   Specialty: Gastroenterology Why: Keep appointment. Contact information: [place] [address] [phone]        Curt Bears, MD. Schedule an appointment as soon as possible for a visit in 2 week(s).   Specialty: Oncology Contact information: [place] [address] [phone]        [facility]. Schedule an appointment as soon as possible for a visit in 1 week(s).   Specialty: Pulmonology Why: 5m nodule and left pleuural effusion. Contact information: [address] [phone]      RFay Records MD Follow up on 02/07/2020.   Specialty: Cardiology Why: Please arrive 15 minutes early for your 2pm post-hospital cardiology appointment Contact information: [phone]             Discharge Diagnoses: Principal diagnosis is #1 1. NSTEMI due to hypertensive emergency. 2. Acute hypoxia 3. 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 4. Hypothyroidism 5. DM II 6. Crohn's 7. Left leg swelling  Discharge Condition: Fair  Disposition: Home  Diet recommendation: Heart healthy with modified carbohydrates  Filed Weights   01/04/20 1223  Weight: 81.6 kg    History of present illness:  The patient is a 79yr old [facility]. He saw his PCP who treated him with a course of antibiotics for pneumonia. However, the patient continued to be short of breath and chest pressure. He also complained of left leg swelling.   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 He has been vaccinated for COVID.  In the ED the patient was found to have an elevated troponin at 219 and 299. EKG demonstrated sinus tachycardia with abnormal R wave progression, and low voltage. CTA of the chest demonstrated no acute intrathoracic pathology, No PE, Stable postsurgical changes of left upper lobe lobectomy. Stable small loculated left pleural effusion, 6 mm LLL pulmonary nodule. Follow up imaging is recommended.   Hospital Course: [facility] were consulted to admit the patient for further evaluation and treatment.  Doppler of the left lower extremity was negative for DVT.  [facility] were consulted to admit the patient for further evaluation and care.  The patient was admitted to a step-down bed. Cardiology was consulted.  Echocardiogram was performed. It demonstrated EF of 60-65%, normal LV function with no regional wall motion abnormalities, mild LVH, and grade 1 diastolic dysfunction. No regional wall motion abnormalities.  Cardiology ordered a lexiscan stress for the patient on 01/05/2020. It demonstrated no ischemic changes.   Mr. OGuamanhas been evaluated by PT. They have recommended that he be discharged to home  without further treatment by PT. He saturated 95% on room air while ambulating 240 ft.  The patient will be discharged to home in fair condition.   Today's assessment: S: The patient is resting comfortably. No new complaints. O: Vitals:  Vitals:     01/06/20 1300 01/06/20 1405  BP:  120/72  Pulse: 89   Resp: (!) 38   Temp:    SpO2: 93%    Constitutional:   The patient is awake, alert, and oriented x 3. No acute distress. Respiratory:   No increased work of breathing.  No wheezes, rales, or rhonchi  No tactile fremitus Cardiovascular:   Regular rate and rhythm  No murmurs, ectopy, or gallups.  No lateral PMI. No thrills. Abdomen:   Abdomen is soft, non-tender, non-distended  No hernias, masses, or organomegaly  Normoactive bowel sounds.  Musculoskeletal:   No cyanosis, clubbing, or edema Skin:   No rashes, lesions, ulcers  palpation of skin: no induration or nodules Neurologic:   CN 2-12 intact  Sensation all 4 extremities intact Psychiatric:   Mental status ? Mood, affect appropriate ? Orientation to person, place, time   judgment and insight appear intact  Discharge Instructions  Discharge Instructions    Activity as tolerated - No restrictions   Complete by: As directed    Call MD for:  difficulty breathing, headache or visual disturbances   Complete by: As directed    Call MD for:  persistant dizziness or light-headedness   Complete by: As directed    Call MD for:  severe uncontrolled pain   Complete by: As directed    Call MD for:  temperature >100.4   Complete by: As directed    Diet - low sodium heart healthy   Complete by: As directed    Diet Carb Modified   Complete by: As directed    Discharge instructions   Complete by: As directed    Discharge to home. Follow up with oncology as directed. Follow up with gastroenterology as directed. Follow up with PCP in 7-10 days. Have CBC and chemistry drawn at that visit. Have TSH checked in 6 weeks.   Increase activity slowly   Complete by: As directed      Allergies as of 01/06/2020   No Known Allergies     Medication List    STOP taking these medications   diphenhydramine-acetaminophen 25-500 MG Tabs tablet Commonly known  as: TYLENOL PM   lisinopril-hydrochlorothiazide 20-12.5 MG tablet Commonly known as: ZESTORETIC   triamcinolone cream 0.1 % Commonly known as: KENALOG     TAKE these medications   (feeding supplement) PROSource Plus liquid Take 30 mLs by mouth 2 (two) times daily between meals.   feeding supplement (KATE FARMS STANDARD 1.4) Liqd liquid Take 325 mLs by mouth 2 (two) times daily between meals.   Asacol HD 800 MG Tbec Generic drug: Mesalamine Take 1,600 mg by mouth 2 (two) times daily.   ASCENSIA CONTOUR TEST VI Use as directed to test two times a day   glucose blood test strip Commonly known as: Estate manager/land agent TEST BLOOD SUGAR TWICE DAILY AS DIRECTED.   [employer] TEST STRIPS test strip Generic drug: glucose blood 1 each by Other route 2 times daily.   aspirin 81 MG EC tablet Take 1 tablet (81 mg total) by mouth daily. Swallow whole.   atenolol 25 MG tablet Commonly known as: TENORMIN Take 0.5 mg by mouth.   B-D ULTRAFINE III SHORT PEN 31G X 8 MM  Misc Generic drug: Insulin Pen Needle USE 1 PEN NEEDLE FOUR TIMES DAILY.   Fifty50 Pen Needles 31G X 8 MM Misc Generic drug: Insulin Pen Needle Inject 31 g into the skin.   busPIRone 15 MG tablet Commonly known as: BUSPAR Take 15 mg by mouth daily.   calcium-vitamin D 500-200 MG-UNIT tablet Commonly known as: OSCAL WITH D Take 1 tablet by mouth daily.   Daily-Vite/Iron/Beta-Carotene Tabs Take 1 tablet by mouth daily.   docusate sodium 100 MG capsule Commonly known as: COLACE Take 1 capsule (100 mg total) by mouth 2 (two) times daily.   hydrALAZINE 25 MG tablet Commonly known as: APRESOLINE Take 1 tablet (25 mg total) by mouth every 6 (six) hours.   insulin lispro 100 UNIT/ML KiwkPen Commonly known as: HumaLOG KwikPen INJECT UNDER THE SKIN THREE TIMES DAILY( 25 UNITS, 25 UNITS AND 20 UNITS RESPECTIVELY BEFORE MEALS) What changed:   how much to take  how to take this  when to take this  additional  instructions   insulin lispro 100 UNIT/ML KiwkPen Commonly known as: HUMALOG Inject into the skin. What changed: Another medication with the same name was changed. Make sure you understand how and when to take each.   leuprolide 11.25 MG injection Commonly known as: LUPRON Inject 11.25 mg into the muscle every 6 (six) months.   levothyroxine 200 MCG tablet Commonly known as: SYNTHROID Take 0.5 tablets (100 mcg total) by mouth daily before breakfast. What changed: how much to take      No Known Allergies  The results of significant diagnostics from this hospitalization (including imaging, microbiology, ancillary and laboratory) are listed below for reference.    Significant Diagnostic Studies: DG Chest 2 View  Result Date: 01/04/2020 CLINICAL DATA:  Shortness of breath for 2 weeks EXAM: CHEST - 2 VIEW COMPARISON:  September 06, 2019, July 02, 2016 FINDINGS: The cardiomediastinal silhouette is unchanged in contour. Unchanged small to moderate LEFT pleural effusion. Surgical clips project over the upper chest. No pneumothorax. LEFT basilar homogeneous opacification, likely atelectasis. RIGHT basilar linear opacities, consistent with atelectasis versus scar. Visualized abdomen is unremarkable. Status post posterior fusion of the thoracic spine. IMPRESSION: Unchanged small to moderate LEFT pleural effusion with favored associated LEFT basilar atelectasis. Electronically Signed   By: Valentino Saxon MD   On: 01/04/2020 13:21   DG Tibia/Fibula Left  Result Date: 01/04/2020 CLINICAL DATA:  Left leg swelling. EXAM: LEFT TIBIA AND FIBULA - 2 VIEW COMPARISON:  None. FINDINGS: Cortical margins of the tibia and fibula are intact. There is no evidence of fracture or other focal bone lesions. Knee and ankle alignment are maintained. There is chondrocalcinosis of the knee. Mild generalized soft tissue edema, nonspecific. IMPRESSION: 1. Mild generalized soft tissue edema. No osseous abnormality. 2.  Chondrocalcinosis of the knee. Electronically Signed   By: Keith Rake M.D.   On: 01/04/2020 20:42   CT Angio Chest PE W and/or Wo Contrast  Result Date: 01/04/2020 CLINICAL DATA:  79 year old male with shortness of breath. Concern for pulmonary embolism. Elevated D-dimer. EXAM: CT ANGIOGRAPHY CHEST WITH CONTRAST TECHNIQUE: Multidetector CT imaging of the chest was performed using the standard protocol during bolus administration of intravenous contrast. Multiplanar CT image reconstructions and MIPs were obtained to evaluate the vascular anatomy. CONTRAST:  184m OMNIPAQUE IOHEXOL 350 MG/ML SOLN COMPARISON:  Chest CT dated 09/06/2019 and radiograph dated 01/04/2020. FINDINGS: Cardiovascular: There is no cardiomegaly or pericardial effusion. The thoracic aorta is unremarkable. There is no CT evidence of  pulmonary embolism. Mediastinum/Nodes: No hilar or mediastinal adenopathy. The esophagus is grossly unremarkable. Thyroidectomy. No mediastinal fluid collection. Lungs/Pleura: There is a small loculated left pleural effusion similar in size to the prior CT. Postsurgical changes of left upper lobectomy. There is a 6 mm nodule in the superior segment of the left lower lobe (21/6) similar to prior CT. No new consolidative changes. No pneumothorax. The central airways are patent. Upper Abdomen: Cholecystectomy. Excreted contrast versus a nonobstructing stone in the upper pole of the right kidney. Musculoskeletal: Osteopenia with degenerative changes of the spine and multilevel posterior fusion hardware. No acute osseous pathology. Review of the MIP images confirms the above findings. IMPRESSION: 1. No acute intrathoracic pathology. No CT evidence of pulmonary embolism. 2. Stable postsurgical changes of left upper lobectomy. 3. Stable small loculated left pleural effusion. 4. A 6 mm left lower lobe pulmonary nodule. Attention on follow-up imaging recommended. Electronically Signed   By: Anner Crete M.D.   On:  01/04/2020 16:45   NM Myocar Multi W/Spect W/Wall Motion / EF  Result Date: 01/05/2020  There was no ST segment deviation noted during stress.  Nuclear stress EF: 69%.  The left ventricular ejection fraction is hyperdynamic (>65%).  The study is normal.  This is a low risk study.    ECHOCARDIOGRAM COMPLETE  Result Date: 01/05/2020    ECHOCARDIOGRAM REPORT   Patient Name:   JEN EPPINGER Date of Exam: 01/05/2020 Medical Rec #:  [id number]         Height:       70.0 in Accession #:    [id number]        Weight:       180.0 lb Date of Birth:  11-12-1940         BSA:          1.996 m Patient Age:    79 years          BP:           167/71 mmHg Patient Gender: M                 HR:           91 bpm. Exam Location:  Inpatient Procedure: 2D Echo, Cardiac Doppler and Color Doppler Indications:    Elevated Troponin  History:        Patient has no prior history of Echocardiogram examinations.                 Risk Factors:Hypertension and Diabetes. Cancer. Hypothyroidism.  Sonographer:    Jonelle Sidle Dance Referring Phys: [id number] ANASTASSIA DOUTOVA  Sonographer Comments: Suboptimal subcostal window. IMPRESSIONS  1. Left ventricular ejection fraction, by estimation, is 60 to 65%. The left ventricle has normal function. The left ventricle has no regional wall motion abnormalities. There is mild left ventricular hypertrophy of the posterior segment. Left ventricular diastolic parameters are consistent with Grade I diastolic dysfunction (impaired relaxation).  2. Right ventricular systolic function is normal. The right ventricular size is normal. There is normal pulmonary artery systolic pressure.  3. The mitral valve is normal in structure. Mild mitral valve regurgitation. No evidence of mitral stenosis.  4. The aortic valve is tricuspid. Aortic valve regurgitation is not visualized. No aortic stenosis is present.  5. The inferior vena cava is normal in size with greater than 50% respiratory variability, suggesting right  atrial pressure of 3 mmHg. FINDINGS  Left Ventricle: Left ventricular ejection fraction, by estimation, is 60 to 65%. The left  ventricle has normal function. The left ventricle has no regional wall motion abnormalities. The left ventricular internal cavity size was normal in size. There is  mild left ventricular hypertrophy of the posterior segment. Left ventricular diastolic parameters are consistent with Grade I diastolic dysfunction (impaired relaxation). Indeterminate filling pressures. Right Ventricle: The right ventricular size is normal. No increase in right ventricular wall thickness. Right ventricular systolic function is normal. There is normal pulmonary artery systolic pressure. The tricuspid regurgitant velocity is 2.40 m/s, and  with an assumed right atrial pressure of 10 mmHg, the estimated right ventricular systolic pressure is 50.3 mmHg. Left Atrium: Left atrial size was normal in size. Right Atrium: Right atrial size was normal in size. Pericardium: Trivial pericardial effusion is present. Mitral Valve: The mitral valve is normal in structure. Normal mobility of the mitral valve leaflets. Mild mitral valve regurgitation. No evidence of mitral valve stenosis. Tricuspid Valve: The tricuspid valve is normal in structure. Tricuspid valve regurgitation is trivial. No evidence of tricuspid stenosis. Aortic Valve: The aortic valve is tricuspid. . There is mild thickening and mild calcification of the aortic valve. Aortic valve regurgitation is not visualized. No aortic stenosis is present. There is mild thickening of the aortic valve. There is mild calcification of the aortic valve. Pulmonic Valve: The pulmonic valve was normal in structure. Pulmonic valve regurgitation is not visualized. No evidence of pulmonic stenosis. Aorta: The aortic root is normal in size and structure. Venous: The inferior vena cava is normal in size with greater than 50% respiratory variability, suggesting right atrial pressure of  3 mmHg. IAS/Shunts: No atrial level shunt detected by color flow Doppler.  LEFT VENTRICLE PLAX 2D LVIDd:         4.40 cm  Diastology LVIDs:         3.50 cm  LV e' lateral:   6.96 cm/s LV PW:         1.20 cm  LV E/e' lateral: 11.6 LV IVS:        1.00 cm  LV e' medial:    6.31 cm/s LVOT diam:     2.00 cm  LV E/e' medial:  12.8 LV SV:         56 LV SV Index:   28 LVOT Area:     3.14 cm  RIGHT VENTRICLE             IVC RV Basal diam:  2.20 cm     IVC diam: 2.00 cm RV S prime:     12.00 cm/s TAPSE (M-mode): 2.0 cm LEFT ATRIUM             Index       RIGHT ATRIUM          Index LA diam:        3.20 cm 1.60 cm/m  RA Area:     9.65 cm LA Vol (A2C):   55.3 ml 27.71 ml/m RA Volume:   16.20 ml 8.12 ml/m LA Vol (A4C):   32.3 ml 16.18 ml/m LA Biplane Vol: 43.1 ml 21.59 ml/m  AORTIC VALVE LVOT Vmax:   98.90 cm/s LVOT Vmean:  62.900 cm/s LVOT VTI:    0.178 m  AORTA Ao Root diam: 3.40 cm Ao Asc diam:  3.00 cm MITRAL VALVE               TRICUSPID VALVE MV Area (PHT): 3.77 cm    TR Peak grad:   23.0 mmHg MV Decel Time: 201 msec  TR Vmax:        240.00 cm/s MV E velocity: 80.50 cm/s MV A velocity: 89.30 cm/s  SHUNTS MV E/A ratio:  0.90        Systemic VTI:  0.18 m                            Systemic Diam: 2.00 cm Skeet Latch MD Electronically signed by Skeet Latch MD Signature Date/Time: 01/05/2020/11:24:59 AM    Final    VAS [place] LOWER EXTREMITY VENOUS (DVT)  Result Date: 01/05/2020  Lower Venous DVTStudy Indications: Edema.  Risk Factors: None identified. Comparison Study: No prior studies. Performing Technologist: Oliver Hum RVT  Examination Guidelines: A complete evaluation includes B-mode imaging, spectral Doppler, color Doppler, and power Doppler as needed of all accessible portions of each vessel. Bilateral testing is considered an integral part of a complete examination. Limited examinations for reoccurring indications may be performed as noted. The reflux portion of the exam is performed with the  patient in reverse Trendelenburg.  +-----+---------------+---------+-----------+----------+--------------+ RIGHTCompressibilityPhasicitySpontaneityPropertiesThrombus Aging +-----+---------------+---------+-----------+----------+--------------+ CFV  Full           Yes      Yes                                 +-----+---------------+---------+-----------+----------+--------------+   +---------+---------------+---------+-----------+----------+--------------+ LEFT     CompressibilityPhasicitySpontaneityPropertiesThrombus Aging +---------+---------------+---------+-----------+----------+--------------+ CFV      Full           Yes      Yes                                 +---------+---------------+---------+-----------+----------+--------------+ SFJ      Full                                                        +---------+---------------+---------+-----------+----------+--------------+ FV Prox  Full                                                        +---------+---------------+---------+-----------+----------+--------------+ FV Mid   Full                                                        +---------+---------------+---------+-----------+----------+--------------+ FV DistalFull                                                        +---------+---------------+---------+-----------+----------+--------------+ PFV      Full                                                        +---------+---------------+---------+-----------+----------+--------------+  POP      Full           Yes      Yes                                 +---------+---------------+---------+-----------+----------+--------------+ PTV      Full                                                        +---------+---------------+---------+-----------+----------+--------------+ PERO     Full                                                         +---------+---------------+---------+-----------+----------+--------------+     Summary: RIGHT: - No evidence of common femoral vein obstruction.  LEFT: - There is no evidence of deep vein thrombosis in the lower extremity.  - No cystic structure found in the popliteal fossa.  *See table(s) above for measurements and observations. Electronically signed by Curt Jews MD on 01/05/2020 at 5:15:58 PM.    Final     Microbiology: Recent Results (from the past 240 hour(s))  SARS Coronavirus 2 by RT PCR (hospital order, performed in [facility] hospital lab) Nasopharyngeal Nasopharyngeal Swab     Status: None   Collection Time: 01/04/20  3:32 PM   Specimen: Nasopharyngeal Swab  Result Value Ref Range Status   SARS Coronavirus 2 NEGATIVE NEGATIVE Final    Comment: (NOTE) SARS-CoV-2 target nucleic acids are NOT DETECTED.  The SARS-CoV-2 RNA is generally detectable in upper and lower respiratory specimens during the acute phase of infection. The lowest concentration of SARS-CoV-2 viral copies this assay can detect is 250 copies / mL. A negative result does not preclude SARS-CoV-2 infection and should not be used as the sole basis for treatment or other patient management decisions.  A negative result may occur with improper specimen collection / handling, submission of specimen other than nasopharyngeal swab, presence of viral mutation(s) within the areas targeted by this assay, and inadequate number of viral copies (<250 copies / mL). A negative result must be combined with clinical observations, patient history, and epidemiological information.  Fact Sheet for Patients:   StrictlyIdeas.no  Fact Sheet for Healthcare Providers: BankingDealers.co.za  This test is not yet approved or  cleared by the [place] FDA and has been authorized for detection and/or diagnosis of SARS-CoV-2 by FDA under an Emergency Use Authorization (EUA).  This EUA will  remain in effect (meaning this test can be used) for the duration of the COVID-19 declaration under Section 564(b)(1) of the Act, 21 U.S.C. section 360bbb-3(b)(1), unless the authorization is terminated or revoked sooner.  Performed at [facility], [place] [address], [place], [place] [address]   MRSA PCR Screening     Status: None   Collection Time: 01/05/20  8:21 PM   Specimen: Nasal Mucosa; Nasopharyngeal  Result Value Ref Range Status   MRSA by PCR NEGATIVE NEGATIVE Final    Comment:        The GeneXpert MRSA Assay (FDA approved for NASAL specimens only), is one component of a comprehensive  MRSA colonization surveillance program. It is not intended to diagnose MRSA infection nor to guide or monitor treatment for MRSA infections. Performed at [facility], [place] [address], [place], [place] [address]      Labs: Basic Metabolic Panel: Recent Labs  Lab 01/04/20 1411 01/05/20 0217 01/06/20 0224  NA 144 142 140  K 4.2 3.6 3.4*  CL 99 98 99  CO2 34* 31 30  GLUCOSE 107* 101* 157*  BUN 25* 21 20  CREATININE 1.25* 1.07 1.06  CALCIUM 9.2 8.9 8.7*  MG  --  1.8  --   PHOS  --  3.2  --    Liver Function Tests: Recent Labs  Lab 01/05/20 0217  AST 25  ALT 21  ALKPHOS 70  BILITOT 0.6  PROT 5.9*  ALBUMIN 3.1*   No results for input(s): LIPASE, AMYLASE in the last 168 hours. No results for input(s): AMMONIA in the last 168 hours. CBC: Recent Labs  Lab 01/04/20 1411 01/05/20 0217 01/06/20 0224  WBC 9.6 8.6 8.4  NEUTROABS 7.9* 6.2  --   HGB 14.0 12.7* 12.9*  HCT 44.5 40.7 40.0  MCV 91.9 93.3 90.3  PLT 246 237 243   Cardiac Enzymes: No results for input(s): CKTOTAL, CKMB, CKMBINDEX, TROPONINI in the last 168 hours. BNP: BNP (last 3 results) Recent Labs    01/04/20 1411  BNP 125.3*    ProBNP (last 3 results) No results for input(s): PROBNP in the last 8760 hours.  CBG: Recent Labs  Lab 01/05/20 1211 01/05/20 1628  01/05/20 1928 01/06/20 0807 01/06/20 1226  GLUCAP 78 135* 189* 134* 136*    Active Problems:   DM (diabetes mellitus), type 2 with renal complications (HCC)   Essential hypertension   Prostate cancer with recurrence   Lung nodule   Crohn's disease ([place])   Adenocarcinoma of left lung, stage 1 (HCC)   Hyperlipidemia   Hypothyroid   Small cell lung cancer, left upper lobe (HCC)   NSTEMI (non-ST elevated myocardial infarction) ([place])   Time coordinating discharge: 38 minutes  Signed:        Camyah Pultz, DO [facility]  01/06/2020, 2:28 PM

## 2020-01-12 ENCOUNTER — Telehealth: Payer: Self-pay | Admitting: Medical Oncology

## 2020-01-12 NOTE — Telephone Encounter (Signed)
Requests appt.

## 2020-01-13 ENCOUNTER — Telehealth: Payer: Self-pay | Admitting: Internal Medicine

## 2020-01-13 NOTE — Telephone Encounter (Signed)
Scheduled appt per 8/4 sch msg - pt is aware of appt

## 2020-01-17 ENCOUNTER — Other Ambulatory Visit: Payer: Self-pay

## 2020-01-17 ENCOUNTER — Ambulatory Visit: Payer: Medicare Other | Admitting: Adult Health

## 2020-01-17 ENCOUNTER — Encounter: Payer: Self-pay | Admitting: Adult Health

## 2020-01-17 VITALS — BP 122/82 | HR 80 | Temp 98.0°F | Ht 68.0 in | Wt 176.2 lb

## 2020-01-17 DIAGNOSIS — R911 Solitary pulmonary nodule: Secondary | ICD-10-CM | POA: Diagnosis not present

## 2020-01-17 DIAGNOSIS — C3492 Malignant neoplasm of unspecified part of left bronchus or lung: Secondary | ICD-10-CM | POA: Diagnosis not present

## 2020-01-17 DIAGNOSIS — R0600 Dyspnea, unspecified: Secondary | ICD-10-CM | POA: Diagnosis not present

## 2020-01-17 DIAGNOSIS — R0609 Other forms of dyspnea: Secondary | ICD-10-CM

## 2020-01-17 NOTE — Assessment & Plan Note (Signed)
Dyspnea questionable etiology.  Appears to be mainly with activity and present for the last 3 to 4 months.  Patient seems to be quite sedentary suspect deconditioning is a component.  Recent cardiac evaluation was unrevealing with 2D echo and nuclear stress test.  We will check pulmonary function testing patient does have some restrictive changes from previous left upper lobectomy.  No appearance of interstitial changes on previous CTs.  As patient has a history of Crohn's disease.  Negative for VTE.  We will check pulmonary function testing on return visit.  Plan  Patient Instructions  Activity as tolerated.  High protein diet .  Follow up with Dr. Erin Fulling in 4-6 weeks with PFTs and As needed

## 2020-01-17 NOTE — Assessment & Plan Note (Signed)
Stable small 6 mm left lower lobe lung nodule.  Patient does have a history of lung cancer with previous left upper lobectomy -2016 Patient has upcoming follow-up with Dr. Earlie Server.  Patient will be having serial CT chest will lead to Dr. Earlie Server concerning timing of next CT.

## 2020-01-17 NOTE — Patient Instructions (Signed)
Activity as tolerated.  High protein diet .  Follow up with Dr. Erin Fulling in 4-6 weeks with PFTs and As needed

## 2020-01-17 NOTE — Progress Notes (Signed)
@Patient  ID: [name], male    DOB: [birth year], 79 y.o.   MRN: [id number]  Chief Complaint  Patient presents with  . Consult    Referring provider: Prince Solian, MD  HPI: 79 year old male former smoker presents for pulmonary consult for lung nodule.  Patient has a history of stage Ia non-small cell lung cancer, adenocarcinoma diagnosed in 2016 status post left VATS with left upper lobectomy and mediastinal lymph node dissection with Dr. Darcey Nora in October 2016, remains on surveillance with Dr. Earlie Server with yearly CT scans Medical history significant for type 1 diabetes/insulin-dependent   TEST/EVENTS :  CT chest September 06, 2019 postop changes in the left upper lobe no evidence of malignant recurrence, stable benign 4 to 5 mm pulmonary nodule in the superior segment of the left lower lobe.  Unchanged small loculated left pleural effusion   01/17/2020 Pulmonary consult : Lung nodule  Patient presents for a pulmonary consult for lung nodule recently seen on CT scan during hospitalization last month.  Patient was recently admitted to the hospital July 27 through January 06, 2020 for shortness of breath and chest pain.  Found to have a non-STEMI due to hypertensive urgency and respiratory distress.  Patient was seen by cardiology and had a nuclear perfusion stress test that was considered low risk.  Echo showed preserved EF grade 1 diastolic dysfunction.  And normal pulmonary artery systolic pressure.  Inflammatory markers with see reactive protein and sed rate were normal. CT angio chest was negative for PE showed stable left upper lobectomy postop changes.  Stable 6 mm pulmonary nodule in the left lower lobe.  And a unchanged loculated left pleural effusion.  Patient says he was diagnosed with pneumonia few months ago and was treated however continued to have shortness of breath over the last 3 to 4 months.  Has been very sedentary with little activity.  And has had a slow weight gain of  about 60 pounds over the last 5 years.  He is retired from [employer].  Denies any known exposure to chronic asbestos.  He does have Crohn's disease on Asacol .  He has a basement that is completely finished with a dehumidifier and no known mold.  Denies any birds or chickens.  No significant travel.  Says it whenever he tries to walk any mild dizziness does get some shortness of breath.  He denies any leg swelling orthopnea or chest pain.  He is a former smoker.  Denies any previous diagnosis of COPD or asthma.  Denies any cough or wheezing.  Patient did have pulmonary function testing in 2018 that showed moderate restriction with an FEV1 at 72%, ratio 78, FVC 67%, DLCO 57%.     No Known Allergies  Immunization History  Administered Date(s) Administered  . Influenza, High Dose Seasonal PF 05/08/2015, 03/05/2016  . Moderna SARS-COVID-2 Vaccination 06/19/2019, 07/25/2019  . Pneumococcal Conjugate-13 05/03/2014  . Pneumococcal Polysaccharide-23 12/09/1995, 04/18/2009  . Td 06/10/2002  . Tdap 05/03/2014  . Zoster 05/02/2009    Past Medical History:  Diagnosis Date  . Adenocarcinoma of left lung, stage 1 ([place]) 03/10/2015  . Ankylosing spondylitis ([place])    Diagnosed during lumbar fracture summer of 2016    . Crohn's disease ([place])   . Gout   . HIstory of basal cell cancer of face    THYROID CA HX  . Hypertension   . Hypocalcemia 04/13/2007  . Hypothyroidism   . Impotence   . 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 Obesity (BMI 30-39.9)   . Osteoporosis    Pt completed 5 years of fosamax in 2013     . Prostate cancer with recurrence    Treated with prostatectomy with recurrence 2012 with Lupron treatment now him   . Psoriasis   . Thyroid cancer ([place]) 1999   Treated with RAI and total thyroidectomy   . Type II diabetes mellitus ([facility])     Tobacco History: Social History   Tobacco Use  Smoking Status Former Smoker  . Packs/day: 2.00  . Years: 30.00  .  Pack years: 60.00  . Types: Cigarettes  . Quit date: 12/26/1989  . Years since quitting: 30.0  Smokeless Tobacco Never Used   Married : Retired from [employer] (no mold)  No travel. No birds/chickens.    Counseling given: Not Answered   Outpatient Medications Prior to Visit  Medication Sig Dispense Refill  . ASACOL HD 800 MG TBEC Take 1,600 mg by mouth 2 (two) times daily.  3  . aspirin 81 MG EC tablet Take 1 tablet (81 mg total) by mouth daily. Swallow whole. 30 tablet 11  . atenolol (TENORMIN) 25 MG tablet Take 0.5 mg by mouth.    . B-D ULTRAFINE III SHORT PEN 31G X 8 MM MISC USE 1 PEN NEEDLE FOUR TIMES DAILY. 100 each 0  . busPIRone (BUSPAR) 15 MG tablet Take 15 mg by mouth daily.     . calcium-vitamin D (OSCAL WITH D) 500-200 MG-UNIT tablet Take 1 tablet by mouth daily.    . Glucose Blood (ASCENSIA CONTOUR TEST VI) Use as directed to test two times a day     . glucose blood (BAYER CONTOUR TEST) test strip TEST BLOOD SUGAR TWICE DAILY AS DIRECTED. 200 each 2  . glucose blood ([employer] TEST STRIPS) test strip 1 each by Other route 2 times daily.    . hydrALAZINE (APRESOLINE) 25 MG tablet Take 1 tablet (25 mg total) by mouth every 6 (six) hours. 120 tablet 0  . insulin lispro (HUMALOG KWIKPEN) 100 UNIT/ML KiwkPen INJECT UNDER THE SKIN THREE TIMES DAILY( 25 UNITS, 25 UNITS AND 20 UNITS RESPECTIVELY BEFORE MEALS) (Patient taking differently: Inject 20-30 Units into the skin See admin instructions. INJECT UNDER THE SKIN THREE TIMES DAILY( 25 units in the morning, 20 units in the afternoon, 30 units in the evening per patient.) 30 mL 5  . insulin lispro (HUMALOG) 100 UNIT/ML KiwkPen Inject into the skin.    . Insulin Pen Needle (FIFTY50 PEN NEEDLES) 31G X 8 MM MISC Inject 31 g into the skin.    Marland Kitchen leuprolide (LUPRON) 11.25 MG injection Inject 11.25 mg into the muscle every 6 (six) months.     . levothyroxine (SYNTHROID) 200 MCG tablet Take 0.5 tablets (100 mcg total) by mouth daily  before breakfast.  0  . Multiple Vitamins-Iron (DAILY-VITE/IRON/BETA-CAROTENE) TABS Take 1 tablet by mouth daily.     . Nutritional Supplements (,FEEDING SUPPLEMENT, PROSOURCE PLUS) liquid Take 30 mLs by mouth 2 (two) times daily between meals. 30 mL 0  . Nutritional Supplements (FEEDING SUPPLEMENT, KATE FARMS STANDARD 1.4,) LIQD liquid Take 325 mLs by mouth 2 (two) times daily between meals. 325 mL 0  . docusate sodium (COLACE) 100 MG capsule Take 1 capsule (100 mg total) by mouth 2 (two) times daily. 10 capsule 0   No facility-administered medications prior to visit.     Review of Systems:   Constitutional:   No  weight loss, night  sweats,  Fevers, chills,  +fatigue, or  lassitude.  HEENT:   No headaches,  Difficulty swallowing,  Tooth/dental problems, or  Sore throat,                No sneezing, itching, ear ache, nasal congestion, post nasal drip,   CV:  No chest pain,  Orthopnea, PND, swelling in lower extremities, anasarca, dizziness, palpitations, syncope.   GI  No heartburn, indigestion, abdominal pain, nausea, vomiting, diarrhea, change in bowel habits, loss of appetite, bloody stools.   Resp:   No chest wall deformity  Skin: no rash or lesions.  GU: no dysuria, change in color of urine, no urgency or frequency.  No flank pain, no hematuria   MS:  No joint pain or swelling.  No decreased range of motion.  No back pain.    Physical Exam  BP 122/82 (BP Location: Left Arm, Cuff Size: Normal)   Pulse 80   Temp 98 F (36.7 C) (Oral)   Ht 5' 8"  (1.727 m)   Wt 176 lb 3.2 oz (79.9 kg)   SpO2 94%   BMI 26.79 kg/m   GEN: A/Ox3; pleasant , NAD, elderly    HEENT:  [place]/AT,  NOSE-clear, THROAT-clear, no lesions, no postnasal drip or exudate noted.   NECK:  Supple w/ fair ROM; no JVD; normal carotid impulses w/o bruits; no thyromegaly or nodules palpated; no lymphadenopathy.    RESP  Clear  P & A; w/o, wheezes/ rales/ or rhonchi. no accessory muscle use, no dullness to  percussion  CARD:  RRR, no m/r/g, no peripheral edema, pulses intact, no cyanosis or clubbing.  GI:   Soft & nt; nml bowel sounds; no organomegaly or masses detected.   Musco: Warm bil, no deformities or joint swelling noted.   Neuro: alert, no focal deficits noted.    Skin: Warm, no lesions or rashes    Lab Results:  CBC  BMET   ProBNP No results found for: PROBNP  Imaging: DG Chest 2 View  Result Date: 01/04/2020 CLINICAL DATA:  Shortness of breath for 2 weeks EXAM: CHEST - 2 VIEW COMPARISON:  September 06, 2019, July 02, 2016 FINDINGS: The cardiomediastinal silhouette is unchanged in contour. Unchanged small to moderate LEFT pleural effusion. Surgical clips project over the upper chest. No pneumothorax. LEFT basilar homogeneous opacification, likely atelectasis. RIGHT basilar linear opacities, consistent with atelectasis versus scar. Visualized abdomen is unremarkable. Status post posterior fusion of the thoracic spine. IMPRESSION: Unchanged small to moderate LEFT pleural effusion with favored associated LEFT basilar atelectasis. Electronically Signed   By: Valentino Saxon MD   On: 01/04/2020 13:21   DG Tibia/Fibula Left  Result Date: 01/04/2020 CLINICAL DATA:  Left leg swelling. EXAM: LEFT TIBIA AND FIBULA - 2 VIEW COMPARISON:  None. FINDINGS: Cortical margins of the tibia and fibula are intact. There is no evidence of fracture or other focal bone lesions. Knee and ankle alignment are maintained. There is chondrocalcinosis of the knee. Mild generalized soft tissue edema, nonspecific. IMPRESSION: 1. Mild generalized soft tissue edema. No osseous abnormality. 2. Chondrocalcinosis of the knee. Electronically Signed   By: Keith Rake M.D.   On: 01/04/2020 20:42   CT Angio Chest PE W and/or Wo Contrast  Result Date: 01/04/2020 CLINICAL DATA:  79 year old male with shortness of breath. Concern for pulmonary embolism. Elevated D-dimer. EXAM: CT ANGIOGRAPHY CHEST WITH CONTRAST  TECHNIQUE: Multidetector CT imaging of the chest was performed using the standard protocol during bolus administration of intravenous contrast.  Multiplanar CT image reconstructions and MIPs were obtained to evaluate the vascular anatomy. CONTRAST:  12m OMNIPAQUE IOHEXOL 350 MG/ML SOLN COMPARISON:  Chest CT dated 09/06/2019 and radiograph dated 01/04/2020. FINDINGS: Cardiovascular: There is no cardiomegaly or pericardial effusion. The thoracic aorta is unremarkable. There is no CT evidence of pulmonary embolism. Mediastinum/Nodes: No hilar or mediastinal adenopathy. The esophagus is grossly unremarkable. Thyroidectomy. No mediastinal fluid collection. Lungs/Pleura: There is a small loculated left pleural effusion similar in size to the prior CT. Postsurgical changes of left upper lobectomy. There is a 6 mm nodule in the superior segment of the left lower lobe (21/6) similar to prior CT. No new consolidative changes. No pneumothorax. The central airways are patent. Upper Abdomen: Cholecystectomy. Excreted contrast versus a nonobstructing stone in the upper pole of the right kidney. Musculoskeletal: Osteopenia with degenerative changes of the spine and multilevel posterior fusion hardware. No acute osseous pathology. Review of the MIP images confirms the above findings. IMPRESSION: 1. No acute intrathoracic pathology. No CT evidence of pulmonary embolism. 2. Stable postsurgical changes of left upper lobectomy. 3. Stable small loculated left pleural effusion. 4. A 6 mm left lower lobe pulmonary nodule. Attention on follow-up imaging recommended. Electronically Signed   By: AAnner CreteM.D.   On: 01/04/2020 16:45   NM Myocar Multi W/Spect W/Wall Motion / EF  Result Date: 01/05/2020  There was no ST segment deviation noted during stress.  Nuclear stress EF: 69%.  The left ventricular ejection fraction is hyperdynamic (>65%).  The study is normal.  This is a low risk study.    ECHOCARDIOGRAM  COMPLETE  Result Date: 01/05/2020    ECHOCARDIOGRAM REPORT   Patient Name:   CTOMA ARTSDate of Exam: 01/05/2020 Medical Rec #:  [id number]        Height:       70.0 in Accession #:    [id number]       Weight:       180.0 lb Date of Birth:  8April 10, 1942        BSA:          1.996 m Patient Age:    89years          BP:           167/71 mmHg Patient Gender: M                 HR:           91 bpm. Exam Location:  Inpatient Procedure: 2D Echo, Cardiac Doppler and Color Doppler Indications:    Elevated Troponin  History:        Patient has no prior history of Echocardiogram examinations.                 Risk Factors:Hypertension and Diabetes. Cancer. Hypothyroidism.  Sonographer:    TJonelle SidleDance Referring Phys: [id number] DOUTOVA  Sonographer Comments: Suboptimal subcostal window. IMPRESSIONS  1. Left ventricular ejection fraction, by estimation, is 60 to 65%. The left ventricle has normal function. The left ventricle has no regional wall motion abnormalities. There is mild left ventricular hypertrophy of the posterior segment. Left ventricular diastolic parameters are consistent with Grade I diastolic dysfunction (impaired relaxation).  2. Right ventricular systolic function is normal. The right ventricular size is normal. There is normal pulmonary artery systolic pressure.  3. The mitral valve is normal in structure. Mild mitral valve regurgitation. No evidence of mitral stenosis.  4. The aortic valve is tricuspid.  Aortic valve regurgitation is not visualized. No aortic stenosis is present.  5. The inferior vena cava is normal in size with greater than 50% respiratory variability, suggesting right atrial pressure of 3 mmHg. FINDINGS  Left Ventricle: Left ventricular ejection fraction, by estimation, is 60 to 65%. The left ventricle has normal function. The left ventricle has no regional wall motion abnormalities. The left ventricular internal cavity size was normal in size. There is  mild left  ventricular hypertrophy of the posterior segment. Left ventricular diastolic parameters are consistent with Grade I diastolic dysfunction (impaired relaxation). Indeterminate filling pressures. Right Ventricle: The right ventricular size is normal. No increase in right ventricular wall thickness. Right ventricular systolic function is normal. There is normal pulmonary artery systolic pressure. The tricuspid regurgitant velocity is 2.40 m/s, and  with an assumed right atrial pressure of 10 mmHg, the estimated right ventricular systolic pressure is 09.4 mmHg. Left Atrium: Left atrial size was normal in size. Right Atrium: Right atrial size was normal in size. Pericardium: Trivial pericardial effusion is present. Mitral Valve: The mitral valve is normal in structure. Normal mobility of the mitral valve leaflets. Mild mitral valve regurgitation. No evidence of mitral valve stenosis. Tricuspid Valve: The tricuspid valve is normal in structure. Tricuspid valve regurgitation is trivial. No evidence of tricuspid stenosis. Aortic Valve: The aortic valve is tricuspid. . There is mild thickening and mild calcification of the aortic valve. Aortic valve regurgitation is not visualized. No aortic stenosis is present. There is mild thickening of the aortic valve. There is mild calcification of the aortic valve. Pulmonic Valve: The pulmonic valve was normal in structure. Pulmonic valve regurgitation is not visualized. No evidence of pulmonic stenosis. Aorta: The aortic root is normal in size and structure. Venous: The inferior vena cava is normal in size with greater than 50% respiratory variability, suggesting right atrial pressure of 3 mmHg. IAS/Shunts: No atrial level shunt detected by color flow Doppler.  LEFT VENTRICLE PLAX 2D LVIDd:         4.40 cm  Diastology LVIDs:         3.50 cm  LV e' lateral:   6.96 cm/s LV PW:         1.20 cm  LV E/e' lateral: 11.6 LV IVS:        1.00 cm  LV e' medial:    6.31 cm/s LVOT diam:     2.00  cm  LV E/e' medial:  12.8 LV SV:         56 LV SV Index:   28 LVOT Area:     3.14 cm  RIGHT VENTRICLE             IVC RV Basal diam:  2.20 cm     IVC diam: 2.00 cm RV S prime:     12.00 cm/s TAPSE (M-mode): 2.0 cm LEFT ATRIUM             Index       RIGHT ATRIUM          Index LA diam:        3.20 cm 1.60 cm/m  RA Area:     9.65 cm LA Vol (A2C):   55.3 ml 27.71 ml/m RA Volume:   16.20 ml 8.12 ml/m LA Vol (A4C):   32.3 ml 16.18 ml/m LA Biplane Vol: 43.1 ml 21.59 ml/m  AORTIC VALVE LVOT Vmax:   98.90 cm/s LVOT Vmean:  62.900 cm/s LVOT VTI:    0.178 m  AORTA Ao Root diam: 3.40 cm Ao Asc diam:  3.00 cm MITRAL VALVE               TRICUSPID VALVE MV Area (PHT): 3.77 cm    TR Peak grad:   23.0 mmHg MV Decel Time: 201 msec    TR Vmax:        240.00 cm/s MV E velocity: 80.50 cm/s MV A velocity: 89.30 cm/s  SHUNTS MV E/A ratio:  0.90        Systemic VTI:  0.18 m                            Systemic Diam: 2.00 cm Skeet Latch MD Electronically signed by Skeet Latch MD Signature Date/Time: 01/05/2020/11:24:59 AM    Final    VAS [place] LOWER EXTREMITY VENOUS (DVT)  Result Date: 01/05/2020  Lower Venous DVTStudy Indications: Edema.  Risk Factors: None identified. Comparison Study: No prior studies. Performing Technologist: Oliver Hum RVT  Examination Guidelines: A complete evaluation includes B-mode imaging, spectral Doppler, color Doppler, and power Doppler as needed of all accessible portions of each vessel. Bilateral testing is considered an integral part of a complete examination. Limited examinations for reoccurring indications may be performed as noted. The reflux portion of the exam is performed with the patient in reverse Trendelenburg.  +-----+---------------+---------+-----------+----------+--------------+ RIGHTCompressibilityPhasicitySpontaneityPropertiesThrombus Aging +-----+---------------+---------+-----------+----------+--------------+ CFV  Full           Yes      Yes                                  +-----+---------------+---------+-----------+----------+--------------+   +---------+---------------+---------+-----------+----------+--------------+ LEFT     CompressibilityPhasicitySpontaneityPropertiesThrombus Aging +---------+---------------+---------+-----------+----------+--------------+ CFV      Full           Yes      Yes                                 +---------+---------------+---------+-----------+----------+--------------+ SFJ      Full                                                        +---------+---------------+---------+-----------+----------+--------------+ FV Prox  Full                                                        +---------+---------------+---------+-----------+----------+--------------+ FV Mid   Full                                                        +---------+---------------+---------+-----------+----------+--------------+ FV DistalFull                                                        +---------+---------------+---------+-----------+----------+--------------+  PFV      Full                                                        +---------+---------------+---------+-----------+----------+--------------+ POP      Full           Yes      Yes                                 +---------+---------------+---------+-----------+----------+--------------+ PTV      Full                                                        +---------+---------------+---------+-----------+----------+--------------+ PERO     Full                                                        +---------+---------------+---------+-----------+----------+--------------+     Summary: RIGHT: - No evidence of common femoral vein obstruction.  LEFT: - There is no evidence of deep vein thrombosis in the lower extremity.  - No cystic structure found in the popliteal fossa.  *See table(s) above for measurements and observations.  Electronically signed by Curt Jews MD on 01/05/2020 at 5:15:58 PM.    Final       PFT Results Latest Ref Rng & Units 07/18/2016 03/08/2015  FVC-Pre L 2.68 2.96  FVC-Predicted Pre % 67 73  FVC-Post L 2.57 3.10  FVC-Predicted Post % 64 77  Pre FEV1/FVC % % 78 81  Post FEV1/FCV % % 76 83  FEV1-Pre L 2.09 2.39  FEV1-Predicted Pre % 72 81  FEV1-Post L 1.95 2.57  DLCO uncorrected ml/min/mmHg 17.16 26.22  DLCO UNC% % 57 88  DLCO corrected ml/min/mmHg 17.62 -  DLCO COR %Predicted % 59 -  DLVA Predicted % 96 110  TLC L 5.08 4.81  TLC % Predicted % 76 72  RV % Predicted % 106 76    No results found for: NITRICOXIDE      Assessment & Plan:   Lung nodule Stable small 6 mm left lower lobe lung nodule.  Patient does have a history of lung cancer with previous left upper lobectomy -2016 Patient has upcoming follow-up with Dr. Earlie Server.  Patient will be having serial CT chest will lead to Dr. Earlie Server concerning timing of next CT.  Dyspnea Dyspnea questionable etiology.  Appears to be mainly with activity and present for the last 3 to 4 months.  Patient seems to be quite sedentary suspect deconditioning is a component.  Recent cardiac evaluation was unrevealing with 2D echo and nuclear stress test.  We will check pulmonary function testing patient does have some restrictive changes from previous left upper lobectomy.  No appearance of interstitial changes on previous CTs.  As patient has a history of Crohn's disease.  Negative for VTE.  We will check pulmonary function testing on return visit.  Plan  Patient Instructions  Activity as tolerated.  High protein diet .  Follow up with Dr. Erin Fulling in 4-6 weeks with PFTs and As needed          Rexene Edison, NP 01/17/2020

## 2020-01-24 ENCOUNTER — Inpatient Hospital Stay: Payer: Medicare Other | Attending: Internal Medicine | Admitting: Internal Medicine

## 2020-01-24 ENCOUNTER — Other Ambulatory Visit: Payer: Self-pay

## 2020-01-24 ENCOUNTER — Encounter: Payer: Self-pay | Admitting: Internal Medicine

## 2020-01-24 VITALS — BP 126/61 | HR 83 | Temp 97.8°F | Resp 20 | Ht 68.0 in | Wt 177.5 lb

## 2020-01-24 DIAGNOSIS — Z85828 Personal history of other malignant neoplasm of skin: Secondary | ICD-10-CM | POA: Insufficient documentation

## 2020-01-24 DIAGNOSIS — K219 Gastro-esophageal reflux disease without esophagitis: Secondary | ICD-10-CM | POA: Insufficient documentation

## 2020-01-24 DIAGNOSIS — E039 Hypothyroidism, unspecified: Secondary | ICD-10-CM | POA: Insufficient documentation

## 2020-01-24 DIAGNOSIS — Z7982 Long term (current) use of aspirin: Secondary | ICD-10-CM | POA: Diagnosis not present

## 2020-01-24 DIAGNOSIS — E669 Obesity, unspecified: Secondary | ICD-10-CM | POA: Insufficient documentation

## 2020-01-24 DIAGNOSIS — Z8546 Personal history of malignant neoplasm of prostate: Secondary | ICD-10-CM | POA: Diagnosis not present

## 2020-01-24 DIAGNOSIS — R911 Solitary pulmonary nodule: Secondary | ICD-10-CM | POA: Insufficient documentation

## 2020-01-24 DIAGNOSIS — C3492 Malignant neoplasm of unspecified part of left bronchus or lung: Secondary | ICD-10-CM

## 2020-01-24 DIAGNOSIS — Z9079 Acquired absence of other genital organ(s): Secondary | ICD-10-CM | POA: Insufficient documentation

## 2020-01-24 DIAGNOSIS — Z902 Acquired absence of lung [part of]: Secondary | ICD-10-CM | POA: Diagnosis not present

## 2020-01-24 DIAGNOSIS — Z9049 Acquired absence of other specified parts of digestive tract: Secondary | ICD-10-CM | POA: Diagnosis not present

## 2020-01-24 DIAGNOSIS — I10 Essential (primary) hypertension: Secondary | ICD-10-CM

## 2020-01-24 DIAGNOSIS — C3412 Malignant neoplasm of upper lobe, left bronchus or lung: Secondary | ICD-10-CM | POA: Insufficient documentation

## 2020-01-24 DIAGNOSIS — C3490 Malignant neoplasm of unspecified part of unspecified bronchus or lung: Secondary | ICD-10-CM | POA: Diagnosis not present

## 2020-01-24 DIAGNOSIS — E119 Type 2 diabetes mellitus without complications: Secondary | ICD-10-CM | POA: Insufficient documentation

## 2020-01-24 DIAGNOSIS — K5090 Crohn's disease, unspecified, without complications: Secondary | ICD-10-CM | POA: Insufficient documentation

## 2020-01-24 DIAGNOSIS — Z79899 Other long term (current) drug therapy: Secondary | ICD-10-CM | POA: Insufficient documentation

## 2020-01-24 DIAGNOSIS — M8580 Other specified disorders of bone density and structure, unspecified site: Secondary | ICD-10-CM | POA: Diagnosis not present

## 2020-01-24 DIAGNOSIS — Z794 Long term (current) use of insulin: Secondary | ICD-10-CM | POA: Insufficient documentation

## 2020-01-24 DIAGNOSIS — Z85850 Personal history of malignant neoplasm of thyroid: Secondary | ICD-10-CM | POA: Diagnosis not present

## 2020-01-24 NOTE — Progress Notes (Signed)
[facility] Telephone:([phone]) [phone]   [phone]) [phone]  OFFICE PROGRESS NOTE  Prince Solian, MD [place] [address]  DIAGNOSIS: Stage IA (T1b, N0, M0) non-small cell lung cancer, adenocarcinoma presenting with left upper lobe pulmonary nodule diagnosed in September 2016.  Molecular study showed positive EGFR mutation in exon 21 (L858R).  PRIOR THERAPY: Status post left VATS with left upper lobectomy and mediastinal lymph node dissection under the care of Dr. Prescott Gum on 03/16/2015.  CURRENT THERAPY: Observation.  INTERVAL HISTORY: Devin [name] 79 y.o. male returns to the clinic today for follow-up visit.  The patient is feeling fine today with no concerning complaints except for shortness of breath with exertion.  He had one episode of severe dyspnea and he presented to the emergency department and was admitted to the hospital for evaluation of his condition.  He had CT angiogram of the chest performed at that time that was unremarkable.  The patient denied having any nausea, vomiting, diarrhea or constipation.  He has no headache or visual changes.  He has lack of stamina and get tired quickly.  He is here today for evaluation hospital follow-up with his condition.  MEDICAL HISTORY: Past Medical History:  Diagnosis Date  . Adenocarcinoma of left lung, stage 1 ([facility]) 03/10/2015  . Ankylosing spondylitis ([facility])    Diagnosed during lumbar fracture summer of 2016    . Crohn's disease ([facility])   . Gout   . HIstory of basal cell cancer of face    THYROID CA HX  . Hypertension   . Hypocalcemia 04/13/2007  . Hypothyroidism   . Impotence   . 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 Obesity (BMI 30-39.9)   . Osteoporosis    Pt completed 5 years of fosamax in 2013     . Prostate cancer with recurrence    Treated with prostatectomy with recurrence 2012 with Lupron treatment now him   . Psoriasis   . Thyroid cancer ([facility])  1999   Treated with RAI and total thyroidectomy   . Type II diabetes mellitus ([facility])     ALLERGIES:  has No Known Allergies.  MEDICATIONS:  Current Outpatient Medications  Medication Sig Dispense Refill  . ASACOL HD 800 MG TBEC Take 1,600 mg by mouth 2 (two) times daily.  3  . aspirin 81 MG EC tablet Take 1 tablet (81 mg total) by mouth daily. Swallow whole. 30 tablet 11  . atenolol (TENORMIN) 25 MG tablet Take 0.5 mg by mouth.    . B-D ULTRAFINE III SHORT PEN 31G X 8 MM MISC USE 1 PEN NEEDLE FOUR TIMES DAILY. 100 each 0  . busPIRone (BUSPAR) 15 MG tablet Take 15 mg by mouth daily.     . calcium-vitamin D (OSCAL WITH D) 500-200 MG-UNIT tablet Take 1 tablet by mouth daily.    . Glucose Blood (ASCENSIA CONTOUR TEST VI) Use as directed to test two times a day     . glucose blood (BAYER CONTOUR TEST) test strip TEST BLOOD SUGAR TWICE DAILY AS DIRECTED. 200 each 2  . glucose blood ([employer] TEST STRIPS) test strip 1 each by Other route 2 times daily.    . insulin lispro (HUMALOG KWIKPEN) 100 UNIT/ML KiwkPen INJECT UNDER THE SKIN THREE TIMES DAILY( 25 UNITS, 25 UNITS AND 20 UNITS RESPECTIVELY BEFORE MEALS) (Patient taking differently: Inject 20-30 Units into the skin See admin instructions. INJECT UNDER THE SKIN THREE TIMES DAILY( 25  units in the morning, 20 units in the afternoon, 30 units in the evening per patient.) 30 mL 5  . insulin lispro (HUMALOG) 100 UNIT/ML KiwkPen Inject into the skin.    . Insulin Pen Needle (FIFTY50 PEN NEEDLES) 31G X 8 MM MISC Inject 31 g into the skin.    Marland Kitchen leuprolide (LUPRON) 11.25 MG injection Inject 11.25 mg into the muscle every 6 (six) months.     . levothyroxine (SYNTHROID) 200 MCG tablet Take 0.5 tablets (100 mcg total) by mouth daily before breakfast.  0  . Multiple Vitamins-Iron (DAILY-VITE/IRON/BETA-CAROTENE) TABS Take 1 tablet by mouth daily.     . Nutritional Supplements (,FEEDING SUPPLEMENT, PROSOURCE PLUS) liquid Take 30 mLs by mouth 2 (two) times daily  between meals. 30 mL 0  . Nutritional Supplements (FEEDING SUPPLEMENT, KATE FARMS STANDARD 1.4,) LIQD liquid Take 325 mLs by mouth 2 (two) times daily between meals. 325 mL 0   No current facility-administered medications for this visit.    SURGICAL HISTORY:  Past Surgical History:  Procedure Laterality Date  . ABDOMINAL EXPLORATION SURGERY     for small bowel obstruction  . APPENDECTOMY  03/2007  . BACK SURGERY    . BASAL CELL CARCINOMA EXCISION  "several"   "head"  . CARDIAC CATHETERIZATION  03/17/2003  . CHOLECYSTECTOMY N/A 05/07/2016   Procedure: LAPAROSCOPIC CHOLECYSTECTOMY;  Surgeon: Fanny Skates, MD;  Location: [facility];  Service: General;  Laterality: N/A;  . COLON SURGERY  03/2007   Resection of cecum, appendix, terminal ileum (approximately/notes 10/10/2010  . HERNIA REPAIR    . LAPAROSCOPIC CHOLECYSTECTOMY  05/07/2016  . LAPAROSCOPIC LYSIS OF ADHESIONS  05/07/2016  . LAPAROSCOPIC LYSIS OF ADHESIONS N/A 05/07/2016   Procedure: LAPAROSCOPIC LYSIS OF ADHESIONS TIMES ONE HOUR;  Surgeon: Fanny Skates, MD;  Location: [facility];  Service: General;  Laterality: N/A;  . POSTERIOR FUSION THORACIC SPINE  02/08/2016   1. Posterior thoracic arthrodesis T7-T11 utilizing morcellized allograft, 2. Posterior thoracic segmental fixation T7-T11 utilizing nuvasive pedicle screws  . PROSTATECTOMY  06/2001   w/bilateral pelvic lymph nose dissection/notes 10/24/2010  . SPINAL FUSION  12/2014   Open reduction internal fixation of L1 Chance fracture with posterior fusion T10-L4 utilizing morcellized allograft and some local autograft, segmental instrumentation T10-L4 inclusive utilizing nuvasive pedicle screws/notes 12/16/2014  . Stress Cardiolite  02/17/2003  . THOROCOTOMY WITH LOBECTOMY  03/16/2015   Procedure: THOROCOTOMY WITH LOBECTOMY;  Surgeon: Ivin Poot, MD;  Location: [facility];  Service: Thoracic;;  . TONSILLECTOMY    . TOTAL THYROIDECTOMY  1997  . Venous Doppler  05/30/2004  . VENTRAL HERNIA  REPAIR  04/14/2008  . VIDEO ASSISTED THORACOSCOPY Left 03/16/2015   Procedure: VIDEO ASSISTED THORACOSCOPY;  Surgeon: Ivin Poot, MD;  [facility];  Service: Thoracic;  Laterality: Left;    REVIEW OF SYSTEMS:  A comprehensive review of systems was negative except for: Constitutional: positive for fatigue Respiratory: positive for dyspnea on exertion   PHYSICAL EXAMINATION: General appearance: alert, cooperative and no distress Head: Normocephalic, without obvious abnormality, atraumatic Neck: no adenopathy, no JVD, supple, symmetrical, trachea midline and thyroid not enlarged, symmetric, no tenderness/mass/nodules Lymph nodes: Cervical, supraclavicular, and axillary nodes normal. Resp: clear to auscultation bilaterally Back: symmetric, no curvature. ROM normal. No CVA tenderness. Cardio: regular rate and rhythm, S1, S2 normal, no murmur, click, rub or gallop GI: soft, non-tender; bowel sounds normal; no masses,  no organomegaly Extremities: extremities normal, atraumatic, no cyanosis or edema  ECOG PERFORMANCE STATUS: 1 - Symptomatic  but completely ambulatory  Blood pressure 126/61, pulse 83, temperature 97.8 F (36.6 C), temperature source Temporal, resp. rate 20, height 5' 8"  (1.727 m), weight 177 lb 8 oz (80.5 kg), SpO2 96 %.  LABORATORY DATA: Lab Results  Component Value Date   WBC 8.4 01/06/2020   HGB 12.9 (L) 01/06/2020   HCT 40.0 01/06/2020   MCV 90.3 01/06/2020   PLT 243 01/06/2020      Chemistry      Component Value Date/Time   NA 140 01/06/2020 0224   NA 140 02/18/2017 1011   K 3.4 (L) 01/06/2020 0224   K 4.5 02/18/2017 1011   CL 99 01/06/2020 0224   CO2 30 01/06/2020 0224   CO2 22 02/18/2017 1011   BUN 20 01/06/2020 0224   BUN 32.1 (H) 02/18/2017 1011   CREATININE 1.06 01/06/2020 0224   CREATININE 1.39 (H) 09/06/2019 0924   CREATININE 1.9 (H) 02/18/2017 1011      Component Value Date/Time   CALCIUM 8.7 (L) 01/06/2020 0224   CALCIUM 9.8 02/18/2017  1011   ALKPHOS 70 01/05/2020 0217   ALKPHOS 102 02/18/2017 1011   AST 25 01/05/2020 0217   AST 25 09/06/2019 0924   AST 35 (H) 02/18/2017 1011   ALT 21 01/05/2020 0217   ALT 20 09/06/2019 0924   ALT 35 02/18/2017 1011   BILITOT 0.6 01/05/2020 0217   BILITOT 0.4 09/06/2019 0924   BILITOT 0.31 02/18/2017 1011       RADIOGRAPHIC STUDIES: DG Chest 2 View  Result Date: 01/04/2020 CLINICAL DATA:  Shortness of breath for 2 weeks EXAM: CHEST - 2 VIEW COMPARISON:  September 06, 2019, July 02, 2016 FINDINGS: The cardiomediastinal silhouette is unchanged in contour. Unchanged small to moderate LEFT pleural effusion. Surgical clips project over the upper chest. No pneumothorax. LEFT basilar homogeneous opacification, likely atelectasis. RIGHT basilar linear opacities, consistent with atelectasis versus scar. Visualized abdomen is unremarkable. Status post posterior fusion of the thoracic spine. IMPRESSION: Unchanged small to moderate LEFT pleural effusion with favored associated LEFT basilar atelectasis. Electronically Signed   By: Valentino Saxon MD   On: 01/04/2020 13:21   DG Tibia/Fibula Left  Result Date: 01/04/2020 CLINICAL DATA:  Left leg swelling. EXAM: LEFT TIBIA AND FIBULA - 2 VIEW COMPARISON:  None. FINDINGS: Cortical margins of the tibia and fibula are intact. There is no evidence of fracture or other focal bone lesions. Knee and ankle alignment are maintained. There is chondrocalcinosis of the knee. Mild generalized soft tissue edema, nonspecific. IMPRESSION: 1. Mild generalized soft tissue edema. No osseous abnormality. 2. Chondrocalcinosis of the knee. Electronically Signed   By: Keith Rake M.D.   On: 01/04/2020 20:42   CT Angio Chest PE W and/or Wo Contrast  Result Date: 01/04/2020 CLINICAL DATA:  79 year old male with shortness of breath. Concern for pulmonary embolism. Elevated D-dimer. EXAM: CT ANGIOGRAPHY CHEST WITH CONTRAST TECHNIQUE: Multidetector CT imaging of the chest  was performed using the standard protocol during bolus administration of intravenous contrast. Multiplanar CT image reconstructions and MIPs were obtained to evaluate the vascular anatomy. CONTRAST:  182m OMNIPAQUE IOHEXOL 350 MG/ML SOLN COMPARISON:  Chest CT dated 09/06/2019 and radiograph dated 01/04/2020. FINDINGS: Cardiovascular: There is no cardiomegaly or pericardial effusion. The thoracic aorta is unremarkable. There is no CT evidence of pulmonary embolism. Mediastinum/Nodes: No hilar or mediastinal adenopathy. The esophagus is grossly unremarkable. Thyroidectomy. No mediastinal fluid collection. Lungs/Pleura: There is a small loculated left pleural effusion similar in size to the prior CT.  Postsurgical changes of left upper lobectomy. There is a 6 mm nodule in the superior segment of the left lower lobe (21/6) similar to prior CT. No new consolidative changes. No pneumothorax. The central airways are patent. Upper Abdomen: Cholecystectomy. Excreted contrast versus a nonobstructing stone in the upper pole of the right kidney. Musculoskeletal: Osteopenia with degenerative changes of the spine and multilevel posterior fusion hardware. No acute osseous pathology. Review of the MIP images confirms the above findings. IMPRESSION: 1. No acute intrathoracic pathology. No CT evidence of pulmonary embolism. 2. Stable postsurgical changes of left upper lobectomy. 3. Stable small loculated left pleural effusion. 4. A 6 mm left lower lobe pulmonary nodule. Attention on follow-up imaging recommended. Electronically Signed   By: Anner Crete M.D.   On: 01/04/2020 16:45   NM Myocar Multi W/Spect W/Wall Motion / EF  Result Date: 01/05/2020  There was no ST segment deviation noted during stress.  Nuclear stress EF: 69%.  The left ventricular ejection fraction is hyperdynamic (>65%).  The study is normal.  This is a low risk study.    ECHOCARDIOGRAM COMPLETE  Result Date: 01/05/2020    ECHOCARDIOGRAM REPORT    Patient Name:   WILLAIM MODE Date of Exam: 01/05/2020 Medical Rec #:  [id number]         Height:       70.0 in Accession #:    [id number]        Weight:       180.0 lb Date of Birth:  [birth year]         BSA:          1.996 m Patient Age:    35 years          BP:           167/71 mmHg Patient Gender: M                 HR:           91 bpm. Exam Location:  Inpatient Procedure: 2D Echo, Cardiac Doppler and Color Doppler Indications:    Elevated Troponin  History:        Patient has no prior history of Echocardiogram examinations.                 Risk Factors:Hypertension and Diabetes. Cancer. Hypothyroidism.  Sonographer:    Jonelle Sidle Dance Referring Phys: [id number] ANASTASSIA DOUTOVA  Sonographer Comments: Suboptimal subcostal window. IMPRESSIONS  1. Left ventricular ejection fraction, by estimation, is 60 to 65%. The left ventricle has normal function. The left ventricle has no regional wall motion abnormalities. There is mild left ventricular hypertrophy of the posterior segment. Left ventricular diastolic parameters are consistent with Grade I diastolic dysfunction (impaired relaxation).  2. Right ventricular systolic function is normal. The right ventricular size is normal. There is normal pulmonary artery systolic pressure.  3. The mitral valve is normal in structure. Mild mitral valve regurgitation. No evidence of mitral stenosis.  4. The aortic valve is tricuspid. Aortic valve regurgitation is not visualized. No aortic stenosis is present.  5. The inferior vena cava is normal in size with greater than 50% respiratory variability, suggesting right atrial pressure of 3 mmHg. FINDINGS  Left Ventricle: Left ventricular ejection fraction, by estimation, is 60 to 65%. The left ventricle has normal function. The left ventricle has no regional wall motion abnormalities. The left ventricular internal cavity size was normal in size. There is  mild left ventricular hypertrophy of the posterior segment. Left  ventricular  diastolic parameters are consistent with Grade I diastolic dysfunction (impaired relaxation). Indeterminate filling pressures. Right Ventricle: The right ventricular size is normal. No increase in right ventricular wall thickness. Right ventricular systolic function is normal. There is normal pulmonary artery systolic pressure. The tricuspid regurgitant velocity is 2.40 m/s, and  with an assumed right atrial pressure of 10 mmHg, the estimated right ventricular systolic pressure is 38.2 mmHg. Left Atrium: Left atrial size was normal in size. Right Atrium: Right atrial size was normal in size. Pericardium: Trivial pericardial effusion is present. Mitral Valve: The mitral valve is normal in structure. Normal mobility of the mitral valve leaflets. Mild mitral valve regurgitation. No evidence of mitral valve stenosis. Tricuspid Valve: The tricuspid valve is normal in structure. Tricuspid valve regurgitation is trivial. No evidence of tricuspid stenosis. Aortic Valve: The aortic valve is tricuspid. . There is mild thickening and mild calcification of the aortic valve. Aortic valve regurgitation is not visualized. No aortic stenosis is present. There is mild thickening of the aortic valve. There is mild calcification of the aortic valve. Pulmonic Valve: The pulmonic valve was normal in structure. Pulmonic valve regurgitation is not visualized. No evidence of pulmonic stenosis. Aorta: The aortic root is normal in size and structure. Venous: The inferior vena cava is normal in size with greater than 50% respiratory variability, suggesting right atrial pressure of 3 mmHg. IAS/Shunts: No atrial level shunt detected by color flow Doppler.  LEFT VENTRICLE PLAX 2D LVIDd:         4.40 cm  Diastology LVIDs:         3.50 cm  LV e' lateral:   6.96 cm/s LV PW:         1.20 cm  LV E/e' lateral: 11.6 LV IVS:        1.00 cm  LV e' medial:    6.31 cm/s LVOT diam:     2.00 cm  LV E/e' medial:  12.8 LV SV:         56 LV SV Index:   28 LVOT  Area:     3.14 cm  RIGHT VENTRICLE             IVC RV Basal diam:  2.20 cm     IVC diam: 2.00 cm RV S prime:     12.00 cm/s TAPSE (M-mode): 2.0 cm LEFT ATRIUM             Index       RIGHT ATRIUM          Index LA diam:        3.20 cm 1.60 cm/m  RA Area:     9.65 cm LA Vol (A2C):   55.3 ml 27.71 ml/m RA Volume:   16.20 ml 8.12 ml/m LA Vol (A4C):   32.3 ml 16.18 ml/m LA Biplane Vol: 43.1 ml 21.59 ml/m  AORTIC VALVE LVOT Vmax:   98.90 cm/s LVOT Vmean:  62.900 cm/s LVOT VTI:    0.178 m  AORTA Ao Root diam: 3.40 cm Ao Asc diam:  3.00 cm MITRAL VALVE               TRICUSPID VALVE MV Area (PHT): 3.77 cm    TR Peak grad:   23.0 mmHg MV Decel Time: 201 msec    TR Vmax:        240.00 cm/s MV E velocity: 80.50 cm/s MV A velocity: 89.30 cm/s  SHUNTS MV E/A ratio:  0.90  Systemic VTI:  0.18 m                            Systemic Diam: 2.00 cm Skeet Latch MD Electronically signed by Skeet Latch MD Signature Date/Time: 01/05/2020/11:24:59 AM    Final    VAS [place] LOWER EXTREMITY VENOUS (DVT)  Result Date: 01/05/2020  Lower Venous DVTStudy Indications: Edema.  Risk Factors: None identified. Comparison Study: No prior studies. Performing Technologist: Oliver Hum RVT  Examination Guidelines: A complete evaluation includes B-mode imaging, spectral Doppler, color Doppler, and power Doppler as needed of all accessible portions of each vessel. Bilateral testing is considered an integral part of a complete examination. Limited examinations for reoccurring indications may be performed as noted. The reflux portion of the exam is performed with the patient in reverse Trendelenburg.  +-----+---------------+---------+-----------+----------+--------------+ RIGHTCompressibilityPhasicitySpontaneityPropertiesThrombus Aging +-----+---------------+---------+-----------+----------+--------------+ CFV  Full           Yes      Yes                                  +-----+---------------+---------+-----------+----------+--------------+   +---------+---------------+---------+-----------+----------+--------------+ LEFT     CompressibilityPhasicitySpontaneityPropertiesThrombus Aging +---------+---------------+---------+-----------+----------+--------------+ CFV      Full           Yes      Yes                                 +---------+---------------+---------+-----------+----------+--------------+ SFJ      Full                                                        +---------+---------------+---------+-----------+----------+--------------+ FV Prox  Full                                                        +---------+---------------+---------+-----------+----------+--------------+ FV Mid   Full                                                        +---------+---------------+---------+-----------+----------+--------------+ FV DistalFull                                                        +---------+---------------+---------+-----------+----------+--------------+ PFV      Full                                                        +---------+---------------+---------+-----------+----------+--------------+ POP      Full           Yes  Yes                                 +---------+---------------+---------+-----------+----------+--------------+ PTV      Full                                                        +---------+---------------+---------+-----------+----------+--------------+ PERO     Full                                                        +---------+---------------+---------+-----------+----------+--------------+     Summary: RIGHT: - No evidence of common femoral vein obstruction.  LEFT: - There is no evidence of deep vein thrombosis in the lower extremity.  - No cystic structure found in the popliteal fossa.  *See table(s) above for measurements and observations. Electronically signed  by Curt Jews MD on 01/05/2020 at 5:15:58 PM.    Final     ASSESSMENT AND PLAN:  This is a very pleasant 79 years old white male with a stage Ia non-small cell lung cancer, adenocarcinoma with positive EGFR mutation in exon 21 (L858R), diagnosed in September 2016 status post left upper lobectomy and has been observation since that time. The patient is complaining of increasing fatigue and weakness as well as shortness of breath with exertion. He had CT angiogram of the chest performed recently that showed no concerning findings for disease recurrence or metastasis and no other explanation for his shortness of breath. He is followed by pulmonary medicine and expected pulmonary function test done soon. I recommended for the patient to continue on observation with repeat CT scan of the chest in 1 year. He was advised to call immediately if he has any concerning symptoms in the interval. The patient voices understanding of current disease status and treatment options and is in agreement with the current care plan. All questions were answered. The patient knows to call the clinic with any problems, questions or concerns. We can certainly see the patient much sooner if necessary.  Disclaimer: This note was dictated with voice recognition software. Similar sounding words can inadvertently be transcribed and may not be corrected upon review.

## 2020-01-25 ENCOUNTER — Telehealth: Payer: Self-pay | Admitting: Internal Medicine

## 2020-01-25 NOTE — Telephone Encounter (Signed)
Scheduled per los. Called and left msg. Mailed printout  °

## 2020-02-06 NOTE — Progress Notes (Signed)
   Cardiology Office Note   Date:  02/07/2020   ID:  Devin [name], Devin [name] [birth year], MRN [id number]  PCP:  Prince Solian, MD  Cardiologist:   Dorris Carnes, MD   Pt presented today for weakness    Admitted to [facility] for symptomatic orthostatic hypotension See H and P for full  Details   PHYSICAL EXAM: VS:  BP (!) 110/50   Pulse 84   Ht 5' 8"  (1.727 m)   Wt 183 lb 6.4 oz (83.2 kg)   SpO2 98%   BMI 27.89 kg/m   With sitting and standing SBP 66/40      Signed, Dorris Carnes, MD  02/07/2020 2:14 PM    [facility] [place], [place], [place]  [address] Phone: [phone]; Fax: [phone]

## 2020-02-07 ENCOUNTER — Encounter: Payer: Self-pay | Admitting: Internal Medicine

## 2020-02-07 ENCOUNTER — Ambulatory Visit: Payer: Medicare Other | Admitting: Internal Medicine

## 2020-02-07 ENCOUNTER — Inpatient Hospital Stay (HOSPITAL_COMMUNITY)
Admission: AD | Admit: 2020-02-07 | Discharge: 2020-02-09 | DRG: 178 | Disposition: A | Discharge status: Home / Self Care | Payer: Medicare Other | Source: Ambulatory Visit | Attending: Internal Medicine | Admitting: Internal Medicine

## 2020-02-07 ENCOUNTER — Other Ambulatory Visit: Payer: Self-pay

## 2020-02-07 ENCOUNTER — Observation Stay (HOSPITAL_COMMUNITY): Payer: Medicare Other

## 2020-02-07 VITALS — BP 110/50 | HR 84 | Ht 68.0 in | Wt 183.4 lb

## 2020-02-07 DIAGNOSIS — L409 Psoriasis, unspecified: Secondary | ICD-10-CM | POA: Diagnosis present

## 2020-02-07 DIAGNOSIS — Z794 Long term (current) use of insulin: Secondary | ICD-10-CM

## 2020-02-07 DIAGNOSIS — C3492 Malignant neoplasm of unspecified part of left bronchus or lung: Secondary | ICD-10-CM | POA: Diagnosis present

## 2020-02-07 DIAGNOSIS — Z85118 Personal history of other malignant neoplasm of bronchus and lung: Secondary | ICD-10-CM

## 2020-02-07 DIAGNOSIS — E1129 Type 2 diabetes mellitus with other diabetic kidney complication: Secondary | ICD-10-CM | POA: Diagnosis present

## 2020-02-07 DIAGNOSIS — Z8701 Personal history of pneumonia (recurrent): Secondary | ICD-10-CM

## 2020-02-07 DIAGNOSIS — Z79890 Hormone replacement therapy (postmenopausal): Secondary | ICD-10-CM

## 2020-02-07 DIAGNOSIS — I951 Orthostatic hypotension: Secondary | ICD-10-CM

## 2020-02-07 DIAGNOSIS — U071 COVID-19: Principal | ICD-10-CM | POA: Diagnosis present

## 2020-02-07 DIAGNOSIS — I252 Old myocardial infarction: Secondary | ICD-10-CM

## 2020-02-07 DIAGNOSIS — M810 Age-related osteoporosis without current pathological fracture: Secondary | ICD-10-CM | POA: Diagnosis present

## 2020-02-07 DIAGNOSIS — R531 Weakness: Secondary | ICD-10-CM | POA: Diagnosis present

## 2020-02-07 DIAGNOSIS — Z85850 Personal history of malignant neoplasm of thyroid: Secondary | ICD-10-CM

## 2020-02-07 DIAGNOSIS — E119 Type 2 diabetes mellitus without complications: Secondary | ICD-10-CM | POA: Diagnosis present

## 2020-02-07 DIAGNOSIS — Z85828 Personal history of other malignant neoplasm of skin: Secondary | ICD-10-CM

## 2020-02-07 DIAGNOSIS — M456 Ankylosing spondylitis lumbar region: Secondary | ICD-10-CM | POA: Diagnosis present

## 2020-02-07 DIAGNOSIS — K5090 Crohn's disease, unspecified, without complications: Secondary | ICD-10-CM | POA: Diagnosis present

## 2020-02-07 DIAGNOSIS — Z8249 Family history of ischemic heart disease and other diseases of the circulatory system: Secondary | ICD-10-CM

## 2020-02-07 DIAGNOSIS — Z87891 Personal history of nicotine dependence: Secondary | ICD-10-CM

## 2020-02-07 DIAGNOSIS — Z902 Acquired absence of lung [part of]: Secondary | ICD-10-CM

## 2020-02-07 DIAGNOSIS — Z79899 Other long term (current) drug therapy: Secondary | ICD-10-CM

## 2020-02-07 DIAGNOSIS — Z0184 Encounter for antibody response examination: Secondary | ICD-10-CM

## 2020-02-07 DIAGNOSIS — Z981 Arthrodesis status: Secondary | ICD-10-CM

## 2020-02-07 DIAGNOSIS — M459 Ankylosing spondylitis of unspecified sites in spine: Secondary | ICD-10-CM | POA: Diagnosis present

## 2020-02-07 DIAGNOSIS — Z7982 Long term (current) use of aspirin: Secondary | ICD-10-CM

## 2020-02-07 DIAGNOSIS — N179 Acute kidney failure, unspecified: Secondary | ICD-10-CM | POA: Diagnosis present

## 2020-02-07 DIAGNOSIS — E890 Postprocedural hypothyroidism: Secondary | ICD-10-CM | POA: Diagnosis present

## 2020-02-07 DIAGNOSIS — I10 Essential (primary) hypertension: Secondary | ICD-10-CM | POA: Diagnosis present

## 2020-02-07 DIAGNOSIS — M109 Gout, unspecified: Secondary | ICD-10-CM | POA: Diagnosis present

## 2020-02-07 DIAGNOSIS — E876 Hypokalemia: Secondary | ICD-10-CM | POA: Diagnosis present

## 2020-02-07 DIAGNOSIS — Z8546 Personal history of malignant neoplasm of prostate: Secondary | ICD-10-CM

## 2020-02-07 DIAGNOSIS — E860 Dehydration: Secondary | ICD-10-CM | POA: Diagnosis present

## 2020-02-07 DIAGNOSIS — Z9079 Acquired absence of other genital organ(s): Secondary | ICD-10-CM

## 2020-02-07 LAB — C-REACTIVE PROTEIN: CRP: 0.8 mg/dL (ref <1.0)

## 2020-02-07 LAB — CBC
HCT: 40.1 % (ref 39.0–52.0)
Hemoglobin: 12.4 g/dL — ABNORMAL LOW (ref 13.0–17.0)
MCH: 28.1 pg (ref 26.0–34.0)
MCHC: 30.9 g/dL (ref 30.0–36.0)
MCV: 90.9 fL (ref 80.0–100.0)
Platelets: 329 10*3/uL (ref 150–400)
RBC: 4.41 MIL/uL (ref 4.22–5.81)
RDW: 14.2 % (ref 11.5–15.5)
WBC: 9.8 10*3/uL (ref 4.0–10.5)
nRBC: 0 % (ref 0.0–0.2)

## 2020-02-07 LAB — CORTISOL: Cortisol: 18.6 ug/dL

## 2020-02-07 LAB — BASIC METABOLIC PANEL
BUN/Creatinine Ratio: 16 (ref 10–24)
BUN: 23 mg/dL (ref 8–27)
CO2: 29 mmol/L (ref 20–29)
Calcium: 9.2 mg/dL (ref 8.6–10.2)
Chloride: 101 mmol/L (ref 96–106)
Creatinine, Ser: 1.45 mg/dL — ABNORMAL HIGH (ref 0.76–1.27)
GFR calc Af Amer: 53 mL/min/{1.73_m2} — ABNORMAL LOW (ref >59)
GFR calc non Af Amer: 46 mL/min/{1.73_m2} — ABNORMAL LOW (ref >59)
Glucose: 214 mg/dL — ABNORMAL HIGH (ref 65–99)
Potassium: 3.8 mmol/L (ref 3.5–5.2)
Sodium: 144 mmol/L (ref 134–144)

## 2020-02-07 LAB — GLUCOSE, CAPILLARY
Glucose-Capillary: 191 mg/dL — ABNORMAL HIGH (ref 70–99)
Glucose-Capillary: 164 mg/dL — ABNORMAL HIGH (ref 70–99)

## 2020-02-07 LAB — SARS CORONAVIRUS 2 BY RT PCR (HOSPITAL ORDER, PERFORMED IN ~~LOC~~ HOSPITAL LAB): SARS Coronavirus 2: POSITIVE — AB

## 2020-02-07 LAB — CREATININE, SERUM
Creatinine, Ser: 1.59 mg/dL — ABNORMAL HIGH (ref 0.61–1.24)
GFR calc Af Amer: 48 mL/min — ABNORMAL LOW (ref >60)
GFR calc non Af Amer: 41 mL/min — ABNORMAL LOW (ref >60)

## 2020-02-07 LAB — TSH: TSH: 27.067 u[IU]/mL — ABNORMAL HIGH (ref 0.350–4.500)

## 2020-02-07 LAB — TROPONIN I (HIGH SENSITIVITY): Troponin I (High Sensitivity): 167 ng/L (ref <18)

## 2020-02-07 LAB — D-DIMER, QUANTITATIVE: D-Dimer, Quant: 0.93 ug/mL-FEU — ABNORMAL HIGH (ref 0.00–0.50)

## 2020-02-07 MED ORDER — INSULIN ASPART 100 UNIT/ML ~~LOC~~ SOLN: 0.0000 [IU] | Freq: Every day | SUBCUTANEOUS | Status: DC

## 2020-02-07 MED ORDER — BUSPIRONE HCL 15 MG PO TABS
15.0000 mg | ORAL_TABLET | Freq: Every day | ORAL | Status: DC
  Administered 2020-02-08 – 2020-02-09 (×2): 15 mg via ORAL
  Filled 2020-02-07 (×2): qty 1
  Filled 2020-02-07 (×2): qty 3

## 2020-02-07 MED ORDER — CALCIUM CARBONATE-VITAMIN D 500-200 MG-UNIT PO TABS
1.0000 | ORAL_TABLET | Freq: Every day | ORAL | Status: DC
  Administered 2020-02-08 – 2020-02-09 (×2): 1 via ORAL
  Filled 2020-02-07 (×2): qty 1

## 2020-02-07 MED ORDER — KATE FARMS STANDARD 1.4 PO LIQD: 325.0000 mL | Freq: Two times a day (BID) | ORAL | Status: DC

## 2020-02-07 MED ORDER — INSULIN ASPART 100 UNIT/ML ~~LOC~~ SOLN: 0.0000 [IU] | Freq: Three times a day (TID) | SUBCUTANEOUS | Status: DC

## 2020-02-07 MED ORDER — INSULIN LISPRO (1 UNIT DIAL) 100 UNIT/ML (KWIKPEN)
30.0000 [IU] | PEN_INJECTOR | Freq: Every day | SUBCUTANEOUS | Status: DC
  Filled 2020-02-07: qty 3

## 2020-02-07 MED ORDER — ADULT MULTIVITAMIN W/MINERALS CH
1.0000 | ORAL_TABLET | Freq: Every day | ORAL | Status: DC
  Administered 2020-02-08 – 2020-02-09 (×2): 1 via ORAL
  Filled 2020-02-07 (×2): qty 1

## 2020-02-07 MED ORDER — PROSOURCE PLUS PO LIQD: 30.0000 mL | Freq: Two times a day (BID) | ORAL | Status: DC

## 2020-02-07 MED ORDER — INSULIN ASPART 100 UNIT/ML ~~LOC~~ SOLN: 25.0000 [IU] | Freq: Every day | SUBCUTANEOUS | Status: DC

## 2020-02-07 MED ORDER — MESALAMINE 400 MG PO CPDR
1600.0000 mg | DELAYED_RELEASE_CAPSULE | Freq: Two times a day (BID) | ORAL | Status: DC
  Administered 2020-02-07 – 2020-02-09 (×4): 1600 mg via ORAL
  Filled 2020-02-07 (×5): qty 4

## 2020-02-07 MED ORDER — INSULIN ASPART 100 UNIT/ML ~~LOC~~ SOLN
3.0000 [IU] | Freq: Three times a day (TID) | SUBCUTANEOUS | Status: DC
  Administered 2020-02-08 – 2020-02-09 (×4): 3 [IU] via SUBCUTANEOUS

## 2020-02-07 MED ORDER — ASPIRIN EC 81 MG PO TBEC
81.0000 mg | DELAYED_RELEASE_TABLET | Freq: Every day | ORAL | Status: DC
  Administered 2020-02-08 – 2020-02-09 (×2): 81 mg via ORAL
  Filled 2020-02-07 (×2): qty 1

## 2020-02-07 MED ORDER — INSULIN ASPART 100 UNIT/ML ~~LOC~~ SOLN: 20.0000 [IU] | Freq: Every day | SUBCUTANEOUS | Status: DC

## 2020-02-07 MED ORDER — INSULIN LISPRO 100 UNIT/ML (KWIKPEN): 20.0000 [IU] | PEN_INJECTOR | SUBCUTANEOUS | Status: DC

## 2020-02-07 MED ORDER — INSULIN LISPRO (1 UNIT DIAL) 100 UNIT/ML (KWIKPEN)
20.0000 [IU] | PEN_INJECTOR | Freq: Every day | SUBCUTANEOUS | Status: DC
  Filled 2020-02-07: qty 3

## 2020-02-07 MED ORDER — HEPARIN SODIUM (PORCINE) 5000 UNIT/ML IJ SOLN
5000.0000 [IU] | Freq: Three times a day (TID) | INTRAMUSCULAR | Status: DC
  Administered 2020-02-07 – 2020-02-09 (×5): 5000 [IU] via SUBCUTANEOUS
  Filled 2020-02-07 (×5): qty 1

## 2020-02-07 MED ORDER — INSULIN LISPRO (1 UNIT DIAL) 100 UNIT/ML (KWIKPEN)
25.0000 [IU] | PEN_INJECTOR | Freq: Every day | SUBCUTANEOUS | Status: DC
  Filled 2020-02-07: qty 3

## 2020-02-07 MED ORDER — INSULIN ASPART 100 UNIT/ML ~~LOC~~ SOLN: 30.0000 [IU] | Freq: Every day | SUBCUTANEOUS | Status: DC

## 2020-02-07 NOTE — H&P (Addendum)
Cardiology Admission History and Physical:   Patient ID: [name] MRN: [id number]; DOB: [birth year]   Admission date: 02/07/2020  Primary Care Provider: Prince Solian, MD [facility] Cardiologist: Harrington Challenger   Chief Complaint:  PT presents for eval of weakness and hyptension  Patient Profile:   Devin [name] is a 79 y.o. male with hx of nonsmall cell lung CA (felt in remission), Crohn's dz whe presented to clinic today for weakness  History of Present Illness:   [name] is a 79 yo who was recently admitted to [facility] for eval of SOB   He wahad been seen by PCP prior and treated with ABX as outpt for pneumonica.   He continue to complain of SOB and cherst pressure and went to ED In ED trop elevated at 219 and 299   EKG with ST   CTA of chest showed no acute pathology  Pt s/p LUL lobectomy     The pt was seen by cardiology    Echo done showed LVEF normal at 60 to 65%   Lexiscan myovue was normal   He was seen by PT and sent home after ambulating   He was sent home on 01/06/20  Since d/c he has continued to feel weak   He denies dizziness   No syncope  No CP   Appetite is OK       Past Medical History:  Diagnosis Date  . Adenocarcinoma of left lung, stage 1 ([place]) 03/10/2015  . Ankylosing spondylitis ([place])    Diagnosed during lumbar fracture summer of 2016    . Crohn's disease ([place])   . Gout   . HIstory of basal cell cancer of face    THYROID CA HX  . Hypertension   . Hypocalcemia 04/13/2007  . Hypothyroidism   . Impotence   . 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 Obesity (BMI 30-39.9)   . Osteoporosis    Pt completed 5 years of fosamax in 2013     . Prostate cancer with recurrence    Treated with prostatectomy with recurrence 2012 with Lupron treatment now him   . Psoriasis   . Thyroid cancer ([place]) 1999   Treated with RAI and total thyroidectomy   . Type II diabetes mellitus ([place])     Past Surgical History:  Procedure  Laterality Date  . ABDOMINAL EXPLORATION SURGERY     for small bowel obstruction  . APPENDECTOMY  03/2007  . BACK SURGERY    . BASAL CELL CARCINOMA EXCISION  "several"   "head"  . CARDIAC CATHETERIZATION  03/17/2003  . CHOLECYSTECTOMY N/A 05/07/2016   Procedure: LAPAROSCOPIC CHOLECYSTECTOMY;  Surgeon: Fanny Skates, MD;  Location: [place];  Service: General;  Laterality: N/A;  . COLON SURGERY  03/2007   Resection of cecum, appendix, terminal ileum (approximately/notes 10/10/2010  . HERNIA REPAIR    . LAPAROSCOPIC CHOLECYSTECTOMY  05/07/2016  . LAPAROSCOPIC LYSIS OF ADHESIONS  05/07/2016  . LAPAROSCOPIC LYSIS OF ADHESIONS N/A 05/07/2016   Procedure: LAPAROSCOPIC LYSIS OF ADHESIONS TIMES ONE HOUR;  Surgeon: Fanny Skates, MD;  Location: [place];  Service: General;  Laterality: N/A;  . POSTERIOR FUSION THORACIC SPINE  02/08/2016   1. Posterior thoracic arthrodesis T7-T11 utilizing morcellized allograft, 2. Posterior thoracic segmental fixation T7-T11 utilizing nuvasive pedicle screws  . PROSTATECTOMY  06/2001   w/bilateral pelvic lymph nose dissection/notes 10/24/2010  . SPINAL FUSION  12/2014   Open reduction internal fixation of L1 Chance fracture with posterior  fusion T10-L4 utilizing morcellized allograft and some local autograft, segmental instrumentation T10-L4 inclusive utilizing nuvasive pedicle screws/notes 12/16/2014  . Stress Cardiolite  02/17/2003  . THOROCOTOMY WITH LOBECTOMY  03/16/2015   Procedure: THOROCOTOMY WITH LOBECTOMY;  Surgeon: Ivin Poot, MD;  Location: [place];  Service: Thoracic;;  . TONSILLECTOMY    . TOTAL THYROIDECTOMY  1997  . Venous Doppler  05/30/2004  . VENTRAL HERNIA REPAIR  04/14/2008  . VIDEO ASSISTED THORACOSCOPY Left 03/16/2015   Procedure: VIDEO ASSISTED THORACOSCOPY;  Surgeon: Ivin Poot, MD;  Location: [facility];  Service: Thoracic;  Laterality: Left;     Medications Prior to Admission: Prior to Admission medications   Medication Sig Start Date End  Date Taking? Authorizing Provider  ASACOL HD 800 MG TBEC Take 1,600 mg by mouth 2 (two) times daily. 11/14/14   [provider]  aspirin 81 MG EC tablet Take 1 tablet (81 mg total) by mouth daily. Swallow whole. 01/06/20   Swayze, Ava, DO  atenolol (TENORMIN) 25 MG tablet Take 0.5 mg by mouth. 05/17/16   [provider]  B-D ULTRAFINE III SHORT PEN 31G X 8 MM MISC USE 1 PEN NEEDLE FOUR TIMES DAILY. 09/18/16   Ivar Drape D, PA  busPIRone (BUSPAR) 15 MG tablet Take 15 mg by mouth daily.  05/17/16   [provider]  calcium-vitamin D (OSCAL WITH D) 500-200 MG-UNIT tablet Take 1 tablet by mouth daily.    [provider]  Glucose Blood (ASCENSIA CONTOUR TEST VI) Use as directed to test two times a day     [provider]  glucose blood (BAYER CONTOUR TEST) test strip TEST BLOOD SUGAR TWICE DAILY AS DIRECTED. 05/17/16   Gale Journey, Sarah L, PA-C  glucose blood ([employer] TEST STRIPS) test strip 1 each by Other route 2 times daily. 05/17/16   [provider]  insulin lispro (HUMALOG KWIKPEN) 100 UNIT/ML KiwkPen INJECT UNDER THE SKIN THREE TIMES DAILY( 25 UNITS, 25 UNITS AND 20 UNITS RESPECTIVELY BEFORE MEALS) Patient taking differently: Inject 20-30 Units into the skin See admin instructions. INJECT UNDER THE SKIN THREE TIMES DAILY( 25 units in the morning, 20 units in the afternoon, 30 units in the evening per patient. 05/17/16   Weber, Damaris Hippo, PA-C  insulin lispro (HUMALOG) 100 UNIT/ML KiwkPen Inject into the skin. 05/17/16   [provider]  Insulin Pen Needle (FIFTY50 PEN NEEDLES) 31G X 8 MM MISC Inject 31 g into the skin. 09/18/16   [provider]  leuprolide (LUPRON) 11.25 MG injection Inject 11.25 mg into the muscle every 6 (six) months.     [provider]  levothyroxine (SYNTHROID) 200 MCG tablet Take 0.5 tablets (100 mcg total) by mouth daily before breakfast. 01/06/20   Swayze, Ava, DO  Multiple Vitamins-Iron  (DAILY-VITE/IRON/BETA-CAROTENE) TABS Take 1 tablet by mouth daily.     [provider]  Nutritional Supplements (,FEEDING SUPPLEMENT, PROSOURCE PLUS) liquid Take 30 mLs by mouth 2 (two) times daily between meals. 01/06/20   Swayze, Ava, DO  Nutritional Supplements (FEEDING SUPPLEMENT, KATE FARMS STANDARD 1.4,) LIQD liquid Take 325 mLs by mouth 2 (two) times daily between meals. 01/06/20   Swayze, Ava, DO     Allergies:   No Known Allergies  Social History:   Social History   Socioeconomic History  . Marital status: Married    Spouse name: Not on file  . Number of children: Not on file  . Years of education: Not on file  . Highest  education level: Not on file  Occupational History  . Occupation: Retired  Tobacco Use  . Smoking status: Former Smoker    Packs/day: 2.00    Years: 30.00    Pack years: 60.00    Types: Cigarettes    Quit date: 12/26/1989    Years since quitting: 30.1  . Smokeless tobacco: Never Used  Vaping Use  . Vaping Use: Never used  Substance and Sexual Activity  . Alcohol use: Yes    Alcohol/week: 0.0 standard drinks    Comment: 02/08/2016 "might have a couple drinks/year; might not"  . Drug use: No  . Sexual activity: Never  Other Topics Concern  . Not on file  Social History Narrative   Retired Development worker, international aid   Social Determinants of Radio broadcast assistant Strain:   . Difficulty of Paying Living Expenses: Not on file  Food Insecurity:   . Worried About Charity fundraiser in the Last Year: Not on file  . Ran Out of Food in the Last Year: Not on file  Transportation Needs:   . Lack of Transportation (Medical): Not on file  . Lack of Transportation (Non-Medical): Not on file  Physical Activity:   . Days of Exercise per Week: Not on file  . Minutes of Exercise per Session: Not on file  Stress:   . Feeling of Stress : Not on file  Social Connections:   . Frequency of Communication with Friends and Family: Not on file  . Frequency of Social  Gatherings with Friends and Family: Not on file  . Attends Religious Services: Not on file  . Active Member of Clubs or Organizations: Not on file  . Attends Archivist Meetings: Not on file  . Marital Status: Not on file  Intimate Partner Violence:   . Fear of Current or Ex-Partner: Not on file  . Emotionally Abused: Not on file  . Physically Abused: Not on file  . Sexually Abused: Not on file    Family History:   The patient's family history includes CAD in his mother. There is no history of Cancer.    ROS:  Please see the history of present illness.  All other ROS reviewed and negative.     Physical Exam/Data:   Vitals:   02/07/20 1630 02/07/20 1632 02/07/20 1634  BP: 136/71 112/61 (!) 86/52  Pulse: 96 98 (!) 102  Resp: 18    Temp: 98.3 F (36.8 C)    TempSrc: Oral    SpO2: 97% 98% 97%   No intake or output data in the 24 hours ending 02/07/20 1738 Last 3 Weights 02/07/2020 01/24/2020 01/17/2020  Weight (lbs) 183 lb 6.4 oz 177 lb 8 oz 176 lb 3.2 oz  Weight (kg) 83.19 kg 80.513 kg 79.924 kg     There is no height or weight on file to calculate BMI.   BP in clinic 104/59   P 48   With sitting SBP dropped to 68/ on both arms   Standing 66/  P 94   Pt sat down   BP after several min 110/40   P 94    General:  Well nourished, well developed, in no acute distress HEENT: normal Lymph: no adenopathy Neck: no JVD Endocrine:  No  thryomegaly Vascular: No carotid bruits; FA pulses 2+ bilaterally without bruits  Cardiac:  normal S1, S2; RRR; no murmur  Lungs:  clear to auscultation bilaterally, no wheezing,no rales Abd: soft, nontender, no hepatomegaly  Ext: no LE  edema Musculoskeletal:  No deformities, BUE and BLE strength normal and equal Skin: warm and dry  Neuro:  CNs 2-12 intact, no focal abnormalities noted Psych:  Normal affect    EKG:  The ECG not done in clinic   On 7/29:  SR 78 bpm  Nonspecific ST changes    Relevant CV Studies: Echo 01/05/20 1. Left  ventricular ejection fraction, by estimation, is 60 to 65%. The left ventricle has normal function. The left ventricle has no regional wall motion abnormalities. There is mild left ventricular hypertrophy of the posterior segment. Left ventricular diastolic parameters are consistent with Grade I diastolic dysfunction (impaired relaxation). 2. Right ventricular systolic function is normal. The right ventricular size is normal. There is normal pulmonary artery systolic pressure. 3. The mitral valve is normal in structure. Mild mitral valve regurgitation. No evidence of mitral stenosis. 4. The aortic valve is tricuspid. Aortic valve regurgitation is not visualized. No aortic stenosis is present. 5. The inferior vena cava is normal in size with greater than 50% respiratory variability, suggesting right atrial pressure of 3 mmHg.  Lexiscan myovue 7.28/21 Narrative & Impression   There was no ST segment deviation noted during stress.  Nuclear stress EF: 69%.  The left ventricular ejection fraction is hyperdynamic (>65%).  The study is normal.  This is a low risk study.    Laboratory Data:  High Sensitivity Troponin:  No results for input(s): TROPONINIHS in the last 720 hours.    Chemistry Recent Labs  Lab 02/07/20 1506  NA 144  K 3.8  CL 101  CO2 29  GLUCOSE 214*  BUN 23  CREATININE 1.45*  CALCIUM 9.2  GFRNONAA 46*  GFRAA 53*    No results for input(s): PROT, ALBUMIN, AST, ALT, ALKPHOS, BILITOT in the last 168 hours. Hematology Recent Labs  Lab 02/07/20 1710  WBC 9.8  RBC 4.41  HGB 12.4*  HCT 40.1  MCV 90.9  MCH 28.1  MCHC 30.9  RDW 14.2  PLT 329   BNPNo results for input(s): BNP, PROBNP in the last 168 hours.  DDimer No results for input(s): DDIMER in the last 168 hours.   Radiology/Studies:  No results found. {  Assessment and Plan:   1.   Weakness / orthostatic hypotension  PT recently admitted with SOB and weakness, very mild bump in trop    Work  up neg  Lisinopril was stopped  He ambulated prior to d/c Presented for f/u today and was found to be profoundly hypotensive on orthostatic check  Fortunately he hs not passed out  ? If related to recent pulmonary infection  With residual autonomic dysfunction ? Other neuro      Pt admitted for further eval    Recomm Labs drawn (CBC, BMET, cortisol, TSH and UA) Will recheck orthostatics on arrival    Consider further Rx with florinef or midodrine  2.  Hx HTN  Hold meds   3  Hx DM  Continue meds   Denies neuropathy  4  Hypothyroidism  Continue synthroid  TSH checked  6  Hx adenoCa of lung    Plan for f/[facility] 2022 at the cancer center  No evid for active dz   ? If further scanning needed  6.   Hx Crohn's dz   PT reports taht this is not flaring   For questions or updates, please contact [place] Please consult [email or web address] for contact info under     Signed, Dorris Carnes, MD  02/07/2020 5:38  PM

## 2020-02-07 NOTE — Patient Instructions (Signed)
Medication Instructions:  NO CHANGES TODAY *If you need a refill on your cardiac medications before your next appointment, please call your pharmacy*   Lab Work: TODAY: URINALYSIS, CORTISOL   Testing/Procedures: NONE    Other Instructions Dr. Harrington Challenger recommends being admitted to the hospital for your symptoms today.

## 2020-02-08 ENCOUNTER — Encounter (HOSPITAL_COMMUNITY): Payer: Self-pay | Admitting: Internal Medicine

## 2020-02-08 DIAGNOSIS — Z85850 Personal history of malignant neoplasm of thyroid: Secondary | ICD-10-CM | POA: Diagnosis not present

## 2020-02-08 DIAGNOSIS — Z8546 Personal history of malignant neoplasm of prostate: Secondary | ICD-10-CM | POA: Diagnosis not present

## 2020-02-08 DIAGNOSIS — M810 Age-related osteoporosis without current pathological fracture: Secondary | ICD-10-CM | POA: Diagnosis present

## 2020-02-08 DIAGNOSIS — L409 Psoriasis, unspecified: Secondary | ICD-10-CM | POA: Diagnosis present

## 2020-02-08 DIAGNOSIS — Z85828 Personal history of other malignant neoplasm of skin: Secondary | ICD-10-CM | POA: Diagnosis not present

## 2020-02-08 DIAGNOSIS — M109 Gout, unspecified: Secondary | ICD-10-CM | POA: Diagnosis present

## 2020-02-08 DIAGNOSIS — I10 Essential (primary) hypertension: Secondary | ICD-10-CM | POA: Diagnosis present

## 2020-02-08 DIAGNOSIS — Z23 Encounter for immunization: Secondary | ICD-10-CM | POA: Diagnosis not present

## 2020-02-08 DIAGNOSIS — Z9079 Acquired absence of other genital organ(s): Secondary | ICD-10-CM | POA: Diagnosis not present

## 2020-02-08 DIAGNOSIS — N183 Chronic kidney disease, stage 3 unspecified: Secondary | ICD-10-CM

## 2020-02-08 DIAGNOSIS — Z902 Acquired absence of lung [part of]: Secondary | ICD-10-CM | POA: Diagnosis not present

## 2020-02-08 DIAGNOSIS — E890 Postprocedural hypothyroidism: Secondary | ICD-10-CM

## 2020-02-08 DIAGNOSIS — E860 Dehydration: Secondary | ICD-10-CM | POA: Diagnosis present

## 2020-02-08 DIAGNOSIS — K5090 Crohn's disease, unspecified, without complications: Secondary | ICD-10-CM

## 2020-02-08 DIAGNOSIS — M456 Ankylosing spondylitis lumbar region: Secondary | ICD-10-CM | POA: Diagnosis present

## 2020-02-08 DIAGNOSIS — N179 Acute kidney failure, unspecified: Secondary | ICD-10-CM | POA: Diagnosis present

## 2020-02-08 DIAGNOSIS — E1122 Type 2 diabetes mellitus with diabetic chronic kidney disease: Secondary | ICD-10-CM

## 2020-02-08 DIAGNOSIS — Z794 Long term (current) use of insulin: Secondary | ICD-10-CM

## 2020-02-08 DIAGNOSIS — Z0184 Encounter for antibody response examination: Secondary | ICD-10-CM | POA: Diagnosis not present

## 2020-02-08 DIAGNOSIS — I252 Old myocardial infarction: Secondary | ICD-10-CM | POA: Diagnosis not present

## 2020-02-08 DIAGNOSIS — Z85118 Personal history of other malignant neoplasm of bronchus and lung: Secondary | ICD-10-CM | POA: Diagnosis not present

## 2020-02-08 DIAGNOSIS — U071 COVID-19: Principal | ICD-10-CM

## 2020-02-08 DIAGNOSIS — Z7982 Long term (current) use of aspirin: Secondary | ICD-10-CM | POA: Diagnosis not present

## 2020-02-08 DIAGNOSIS — R531 Weakness: Secondary | ICD-10-CM | POA: Diagnosis present

## 2020-02-08 DIAGNOSIS — E876 Hypokalemia: Secondary | ICD-10-CM | POA: Diagnosis present

## 2020-02-08 DIAGNOSIS — I951 Orthostatic hypotension: Secondary | ICD-10-CM | POA: Diagnosis present

## 2020-02-08 DIAGNOSIS — E119 Type 2 diabetes mellitus without complications: Secondary | ICD-10-CM | POA: Diagnosis present

## 2020-02-08 LAB — GLUCOSE, CAPILLARY
Glucose-Capillary: 120 mg/dL — ABNORMAL HIGH (ref 70–99)
Glucose-Capillary: 110 mg/dL — ABNORMAL HIGH (ref 70–99)
Glucose-Capillary: 118 mg/dL — ABNORMAL HIGH (ref 70–99)
Glucose-Capillary: 123 mg/dL — ABNORMAL HIGH (ref 70–99)

## 2020-02-08 LAB — CBC WITH DIFFERENTIAL/PLATELET
Abs Immature Granulocytes: 0.06 10*3/uL (ref 0.00–0.07)
Basophils Absolute: 0.1 10*3/uL (ref 0.0–0.1)
Basophils Relative: 0 %
Eosinophils Absolute: 0.2 10*3/uL (ref 0.0–0.5)
Eosinophils Relative: 2 %
HCT: 44.3 % (ref 39.0–52.0)
Hemoglobin: 14 g/dL (ref 13.0–17.0)
Immature Granulocytes: 1 %
Lymphocytes Relative: 20 %
Lymphs Abs: 2.2 10*3/uL (ref 0.7–4.0)
MCH: 28.6 pg (ref 26.0–34.0)
MCHC: 31.6 g/dL (ref 30.0–36.0)
MCV: 90.6 fL (ref 80.0–100.0)
Monocytes Absolute: 0.9 10*3/uL (ref 0.1–1.0)
Monocytes Relative: 8 %
Neutro Abs: 7.8 10*3/uL — ABNORMAL HIGH (ref 1.7–7.7)
Neutrophils Relative %: 69 %
Platelets: 344 10*3/uL (ref 150–400)
RBC: 4.89 MIL/uL (ref 4.22–5.81)
RDW: 14.3 % (ref 11.5–15.5)
WBC: 11.2 10*3/uL — ABNORMAL HIGH (ref 4.0–10.5)
nRBC: 0 % (ref 0.0–0.2)

## 2020-02-08 LAB — COMPREHENSIVE METABOLIC PANEL
ALT: 24 U/L (ref 0–44)
AST: 29 U/L (ref 15–41)
Albumin: 3.6 g/dL (ref 3.5–5.0)
Alkaline Phosphatase: 92 U/L (ref 38–126)
Anion gap: 15 (ref 5–15)
BUN: 18 mg/dL (ref 8–23)
CO2: 29 mmol/L (ref 22–32)
Calcium: 8.9 mg/dL (ref 8.9–10.3)
Chloride: 96 mmol/L — ABNORMAL LOW (ref 98–111)
Creatinine, Ser: 1.21 mg/dL (ref 0.61–1.24)
GFR calc Af Amer: >60 mL/min (ref >60)
GFR calc non Af Amer: 57 mL/min — ABNORMAL LOW (ref >60)
Glucose, Bld: 118 mg/dL — ABNORMAL HIGH (ref 70–99)
Potassium: 3.3 mmol/L — ABNORMAL LOW (ref 3.5–5.1)
Sodium: 140 mmol/L (ref 135–145)
Total Bilirubin: 0.4 mg/dL (ref 0.3–1.2)
Total Protein: 6.8 g/dL (ref 6.5–8.1)

## 2020-02-08 LAB — D-DIMER, QUANTITATIVE: D-Dimer, Quant: 1.08 ug/mL-FEU — ABNORMAL HIGH (ref 0.00–0.50)

## 2020-02-08 LAB — PROCALCITONIN: Procalcitonin: <0.1 ng/mL

## 2020-02-08 LAB — C-REACTIVE PROTEIN: CRP: <0.5 mg/dL (ref <1.0)

## 2020-02-08 LAB — MAGNESIUM: Magnesium: 1.6 mg/dL — ABNORMAL LOW (ref 1.7–2.4)

## 2020-02-08 LAB — T4, FREE: Free T4: 0.75 ng/dL (ref 0.61–1.12)

## 2020-02-08 LAB — TSH: TSH: 24.431 u[IU]/mL — ABNORMAL HIGH (ref 0.350–4.500)

## 2020-02-08 LAB — TROPONIN I (HIGH SENSITIVITY): Troponin I (High Sensitivity): 97 ng/L — ABNORMAL HIGH (ref <18)

## 2020-02-08 MED ORDER — SODIUM CHLORIDE 0.9 % IV SOLN: INTRAVENOUS | Status: DC | PRN

## 2020-02-08 MED ORDER — LEVOTHYROXINE SODIUM 25 MCG PO TABS
125.0000 ug | ORAL_TABLET | Freq: Every day | ORAL | Status: DC
  Administered 2020-02-08 – 2020-02-09 (×2): 125 ug via ORAL
  Filled 2020-02-08 (×3): qty 1

## 2020-02-08 MED ORDER — SODIUM CHLORIDE 0.9 % IV SOLN: INTRAVENOUS | Status: AC

## 2020-02-08 MED ORDER — POTASSIUM CHLORIDE CRYS ER 20 MEQ PO TBCR
40.0000 meq | EXTENDED_RELEASE_TABLET | Freq: Once | ORAL | Status: AC
  Administered 2020-02-08: 40 meq via ORAL
  Filled 2020-02-08: qty 2

## 2020-02-08 MED ORDER — METHYLPREDNISOLONE SODIUM SUCC 125 MG IJ SOLR: 125.0000 mg | Freq: Once | INTRAMUSCULAR | Status: DC | PRN

## 2020-02-08 MED ORDER — DIPHENHYDRAMINE HCL 50 MG/ML IJ SOLN
50.0000 mg | Freq: Once | INTRAMUSCULAR | Status: DC | PRN
  Filled 2020-02-08 (×2): qty 1

## 2020-02-08 MED ORDER — SODIUM CHLORIDE 0.9 % IV SOLN
1200.0000 mg | Freq: Once | INTRAVENOUS | Status: AC
  Administered 2020-02-08: 1200 mg via INTRAVENOUS
  Filled 2020-02-08: qty 10

## 2020-02-08 MED ORDER — ALBUTEROL SULFATE HFA 108 (90 BASE) MCG/ACT IN AERS
2.0000 | INHALATION_SPRAY | Freq: Once | RESPIRATORY_TRACT | Status: DC | PRN
  Filled 2020-02-08: qty 6.7

## 2020-02-08 MED ORDER — FAMOTIDINE IN NACL 20-0.9 MG/50ML-% IV SOLN
20.0000 mg | Freq: Once | INTRAVENOUS | Status: DC | PRN
  Filled 2020-02-08: qty 50

## 2020-02-08 MED ORDER — LEVOTHYROXINE SODIUM 100 MCG PO TABS: 100.0000 ug | ORAL_TABLET | Freq: Every day | ORAL | Status: DC

## 2020-02-08 MED ORDER — EPINEPHRINE 0.3 MG/0.3ML IJ SOAJ
0.3000 mg | Freq: Once | INTRAMUSCULAR | Status: DC | PRN
  Filled 2020-02-08 (×2): qty 0.6

## 2020-02-08 MED ORDER — ATENOLOL 25 MG PO TABS
12.5000 mg | ORAL_TABLET | Freq: Two times a day (BID) | ORAL | Status: DC
  Administered 2020-02-08 (×2): 12.5 mg via ORAL
  Filled 2020-02-08 (×3): qty 0.5

## 2020-02-08 NOTE — Progress Notes (Signed)
SATURATION QUALIFICATIONS: (This note is used to comply with regulatory documentation for home oxygen)  Patient Saturations on Room Air at Rest = 95%  Patient Saturations on Room Air while Ambulating = 90%  Patient Saturations on 0 Liters of oxygen while Ambulating = 90%  Please briefly explain why patient needs home oxygen: Pt not requiring O2 at this time

## 2020-02-08 NOTE — Progress Notes (Signed)
Pharmacy COVID-19 Monoclonal Antibody Screening  Devin [name] was identified as being not hospitalized with symptoms from Covid-19 on admission but an incidental positive PCR has been documented.  The patient may qualify for the use of monoclonal antibodies (mAB) for COVID-19 viral infection to prevent worsening symptoms stemming from Covid-19 infection.  The patient was identified based on a positive COVID-19 PCR and not requiring the use of supplemental oxygen at this time.  This patient meets the FDA criteria for Emergency Use Authorization of casirivimab/imdevimab.  Has a (+) direct SARS-CoV-2 viral test result  Is NOT hospitalized due to COVID-19  Is within 10 days of symptom onset  Has at least one of the high risk factor(s) for progression to severe COVID-19 and/or hospitalization as defined in EUA.  Specific high risk criteria : Older age (>/= 77 yo), BMI > 25 and Diabetes  Additionally: The patient has not had a positive COVID-19 PCR in the last 90 days.  The patient is fully vaccinated against COVID-19. - Moderna documented as given 06/19/19 and 07/25/19  Since the patient is partially or fully vaccinated for COVID-19, is asymptomatic with a cycle time of < 32, and meets high risk criteria, the patient is eligible for mAB administration.  - E and N2 values as reported from micro are 25.8 and 28.3 respectively  This eligibility and indication for treatment was discussed with the patient's physician: Dr. Candiss Norse  Plan: Based on the above discussion, it was decided that the patient will receive one dose of mAB combination.Casirivimab/imdevimab has been ordered. Pharmacy will coordinate administration timing with patient's nurse. Recommended infusion monitoring parameters communicated to the nursing team.  Thank you for allowing pharmacy to be a part of this patient's care.  Alycia Rossetti, PharmD, BCPS Clinical Pharmacist 02/08/2020 9:49 AM   **Pharmacist phone directory  can now be found on [email or web address] ([email or web address]).  Listed under [place].

## 2020-02-08 NOTE — Progress Notes (Signed)
Lab stated Troponin level = 167 MD notified. Norton Blizzard, RN

## 2020-02-08 NOTE — Progress Notes (Signed)
Progress Note  Patient Name: Devin [name] Date of Encounter: 02/08/2020  Primary Cardiologist:   Sanda Klein, MD   Subjective   Patient seen in consultation yesterday by Dr. Harrington Challenger.  Admitted with orthostatic hypotension.  BP is improved although still orthostatic on repeat readings.  No longer hypotensive.    Denies pain or SOB.   Says he feels better but his weakness has been intermittent at home.   Inpatient Medications    Scheduled Meds: . aspirin EC  81 mg Oral Daily  . busPIRone  15 mg Oral Daily  . calcium-vitamin D  1 tablet Oral Daily  . heparin  5,000 Units Subcutaneous Q8H  . insulin aspart  0-5 Units Subcutaneous QHS  . insulin aspart  0-9 Units Subcutaneous TID WC  . insulin aspart  3 Units Subcutaneous TID WC  . levothyroxine  125 mcg Oral QAC breakfast  . Mesalamine  1,600 mg Oral BID  . multivitamin with minerals  1 tablet Oral Daily   Continuous Infusions: . sodium chloride 100 mL/hr at 02/08/20 0226   PRN Meds:    Vital Signs    Vitals:   02/07/20 2027 02/08/20 0000 02/08/20 0400 02/08/20 0721  BP: (!) 159/82 (!) 151/79 117/88 (!) 141/82  Pulse: 95 77 75 87  Resp: 20 20 18  (!) 24  Temp: 98.2 F (36.8 C) 98.1 F (36.7 C) 98.2 F (36.8 C) 97.6 F (36.4 C)  TempSrc: Oral Oral Oral Oral  SpO2: 96% 93% 94% 94%    Intake/Output Summary (Last 24 hours) at 02/08/2020 0756 Last data filed at 02/08/2020 0076 Gross per 24 hour  Intake 548.33 ml  Output --  Net 548.33 ml   There were no vitals filed for this visit.  Telemetry    NSR - Personally Reviewed  ECG    NA - Personally Reviewed  Physical Exam   GEN: No acute distress.   Neck: No  JVD Cardiac: RRR, no murmurs, rubs, or gallops.  Respiratory: Clear  to auscultation bilaterally. GI: Soft, nontender, non-distended  MS: No  edema; No deformity. Neuro:  Nonfocal  Psych: Normal affect   Labs    Chemistry Recent Labs  Lab 02/07/20 1506 02/07/20 1710 02/08/20 0327  NA  144  --  140  K 3.8  --  3.3*  CL 101  --  96*  CO2 29  --  29  GLUCOSE 214*  --  118*  BUN 23  --  18  CREATININE 1.45* 1.59* 1.21  CALCIUM 9.2  --  8.9  PROT  --   --  6.8  ALBUMIN  --   --  3.6  AST  --   --  29  ALT  --   --  24  ALKPHOS  --   --  92  BILITOT  --   --  0.4  GFRNONAA 46* 41* 57*  GFRAA 53* 48* >60  ANIONGAP  --   --  15     Hematology Recent Labs  Lab 02/07/20 1710 02/08/20 0327  WBC 9.8 11.2*  RBC 4.41 4.89  HGB 12.4* 14.0  HCT 40.1 44.3  MCV 90.9 90.6  MCH 28.1 28.6  MCHC 30.9 31.6  RDW 14.2 14.3  PLT 329 344    Cardiac EnzymesNo results for input(s): TROPONINI in the last 168 hours. No results for input(s): TROPIPOC in the last 168 hours.   BNPNo results for input(s): BNP, PROBNP in the last 168 hours.   DDimer  Recent Labs  Lab 02/07/20 2204 02/08/20 0327  DDIMER 0.93* 1.08*     Radiology    DG CHEST PORT 1 VIEW  Result Date: 02/08/2020 CLINICAL DATA:  COVID-19 EXAM: PORTABLE CHEST 1 VIEW COMPARISON:  01/04/2020 FINDINGS: Right hemidiaphragm remains elevated. Decreased size of small left pleural effusion. Improved aeration at the left lung base. No focal airspace consolidation or pulmonary edema. Normal cardiomediastinal contours. Spinal rods. IMPRESSION: Decreased size of small left pleural effusion and improved aeration of the left lung base. Electronically Signed   By: Ulyses Jarred M.D.   On: 02/08/2020 00:36    Cardiac Studies   ECHO 01/05/20  1. Left ventricular ejection fraction, by estimation, is 60 to 65%. The  left ventricle has normal function. The left ventricle has no regional  wall motion abnormalities. There is mild left ventricular hypertrophy of  the posterior segment. Left  ventricular diastolic parameters are consistent with Grade I diastolic  dysfunction (impaired relaxation).  2. Right ventricular systolic function is normal. The right ventricular  size is normal. There is normal pulmonary artery systolic  pressure.  3. The mitral valve is normal in structure. Mild mitral valve  regurgitation. No evidence of mitral stenosis.  4. The aortic valve is tricuspid. Aortic valve regurgitation is not  visualized. No aortic stenosis is present.  5. The inferior vena cava is normal in size with greater than 50%  respiratory variability, suggesting right atrial pressure of 3 mmHg.   FINDINGS  Left Ventricle: Left ventricular ejection fraction, by estimation, is 60  to 65%. The left ventricle has normal function. The left ventricle has no  regional wall motion abnormalities. The left ventricular internal cavity  size was normal in size. There is  mild left ventricular hypertrophy of the posterior segment. Left  ventricular diastolic parameters are consistent with Grade I diastolic  dysfunction (impaired relaxation). Indeterminate filling pressures.   Patient Profile     79 y.o. male with hx of nonsmall cell lung CA (felt in remission), Crohn's dz whe presented to clinic for weakness and was admitted with orthostatic hypotension.     Assessment & Plan    Orthostatic hypotension:   Holding all antihypertensives.  BP is improved although mildly orthostatic on repeat testing.  No indication for midodrine or fludrocortisone.    Will likely hold low dose atenolol at discharge.  No other testing suggested or change in therapy.    AKI:    Creat improved.  No change in therapy.   COVID 19 positive:  Given mAb cocktail.    ELEVATED TSH:  Synthroid dose increased by Hospitalists.  I appreciate their help.    For questions or updates, please contact [place] Please consult [email or web address] for contact info under Cardiology/STEMI.   Signed, Minus Breeding, MD  02/08/2020, 7:56 AM

## 2020-02-08 NOTE — Progress Notes (Signed)
PROGRESS NOTE                                                                                                                                                                                                             Patient Demographics:    Devin [name], is a 79 y.o. male, DOB - [birth year], JYN:[id number]  Outpatient Primary MD for the patient is [name], Ravisankar, MD    LOS - 0  Admit date - 02/07/2020    CC - weakness     Brief Narrative - Devin [name] is a 79 y.o. male with medical history significant of NSCLC in remission, Crohn's dz, DM2, HTN, hypothyroidism. Pt was admitted to [facility] for evaluation of SOB back at end of July.  Seen as outpt by PCP prior to that and treated with ABx for PNA.  Despite that he continued to complain of SOB and chest pressure and went to ED, he was diagnosed with NSTEMI seen by cardiology underwent echo with normal EF and a Lexiscan which was unremarkable he was subsequently discharged home where he continued to feel weak, he went to see Dr. Dorris Carnes his cardiologist on 02/07/2020 was very weak and was sent to the hospital for admission.  The hospital he was found to be slightly dehydrated and incidentally positive for COVID-19, of note he is vaccinated.  He was admitted for treatment of incidental COVID-19 infection, dehydration and generalized weakness.      Subjective:    Devin [name] today has, No headache, No chest pain, No abdominal pain - No Nausea, No new weakness tingling or numbness, no Cough - SOB.     Assessment  & Plan :    Principal Problem:   Orthostatic hypotension Active Problems:   Postsurgical hypothyroidism   DM (diabetes mellitus), type 2 with renal complications (HCC)   Essential hypertension   Ankylosing spondylitis (HCC)   Crohn's disease ([place])   Adenocarcinoma of left lung, stage 1 ([place])   1. Incidental COVID-19 infection in a vaccinated patient - he  has no COVID-19 symptoms, CRP is stable, chest x-ray is unimpressive, he has no shortness of breath or cough, he will get Covid antibody cocktail, monitor.  Encouraged the patient to sit up in chair in the daytime use I-S and flutter valve for pulmonary toiletry and then prone in bed when  at night.  Will advance activity and titrate down oxygen as possible.     SpO2: 94 %  Recent Labs  Lab 02/07/20 1637 02/07/20 1710 02/07/20 2204 02/08/20 0327  WBC  --  9.8  --  11.2*  PLT  --  329  --  344  CRP  --   --  0.8 <0.5  DDIMER  --   --  0.93* 1.08*  PROCALCITON  --   --  <0.10  --   AST  --   --   --  29  ALT  --   --   --  24  ALKPHOS  --   --   --  92  BILITOT  --   --   --  0.4  ALBUMIN  --   --   --  3.6  SARSCOV2NAA POSITIVE*  --   --   --     2.  Recent NSTEMI with unremarkable cardiology work-up by the cardiology team.  No acute symptoms, troponin levels are flat and non-ACS pattern, cardiology on board, for now placed on home dose aspirin and low-dose beta-blocker for secondary prevention.  Of note he has not been placed on statin by cardiology.  Currently chest pain-free.  3.  Generalized weakness and dehydration.  Gentle IV fluids, PT OT.  Currently unsafe for discharge as he has been persistently weak with near falls multiple times at home.  4.  History of thyroid resection with  hypothyroidism.  TSH is expectedly high, will check free T4 and T3, continue home dose Synthroid for now.  5.  History of Crohn's disease.  Stable, continue Asacol.  6.  History of lung cancer with part of left lung removed.  Stable.  7.  DM type II.  Continue sliding scale.  Lab Results  Component Value Date   HGBA1C 6.3 (H) 01/04/2020   CBG (last 3)  Recent Labs    02/07/20 2031 02/08/20 0718 02/08/20 1145  GLUCAP 164* 120* 110*        Condition - Extremely Guarded  Family Communication  :  Daughter Devin [name] [phone] on 02/08/20  Code Status :  Full  Consults  :  Cards   Procedures  :  None  PUD Prophylaxis :   Disposition Plan  :    Status is: Observation  The patient remains OBS appropriate and will d/c before 2 midnights.  Dispo: The patient is from: Home              Anticipated d/c is to: Home              Anticipated d/c date is: 2 days              Patient currently is not medically stable to d/c.   DVT Prophylaxis  :  Heparin    Lab Results  Component Value Date   PLT 344 02/08/2020    Diet :  Diet Order            Diet Carb Modified Fluid consistency: Thin; Room service appropriate? Yes  Diet effective now                  Inpatient Medications  Scheduled Meds: . aspirin EC  81 mg Oral Daily  . busPIRone  15 mg Oral Daily  . calcium-vitamin D  1 tablet Oral Daily  . heparin  5,000 Units Subcutaneous Q8H  . insulin aspart  0-5 Units Subcutaneous QHS  . insulin aspart  0-9 Units Subcutaneous TID WC  . insulin aspart  3 Units Subcutaneous TID WC  . levothyroxine  125 mcg Oral QAC breakfast  . Mesalamine  1,600 mg Oral BID  . multivitamin with minerals  1 tablet Oral Daily   Continuous Infusions: . sodium chloride    . casirivimab-imdevimab (REGEN-COV) IVPB    . famotidine (PEPCID) IV     PRN Meds:.sodium chloride, albuterol, diphenhydrAMINE, EPINEPHrine, famotidine (PEPCID) IV, methylPREDNISolone (SOLU-MEDROL) injection  Antibiotics  :    Anti-infectives (From admission, onward)   None       Time Spent in minutes  30   Lala Lund M.D on 02/08/2020 at 11:47 AM  To page go to [email or web address] - password [email or web address]  [facility] -  Office  [phone]    See all Orders from today for further details    Objective:   Vitals:   02/07/20 2027 02/08/20 0000 02/08/20 0400 02/08/20 0721  BP: (!) 159/82 (!) 151/79 117/88 (!) 141/82  Pulse: 95 77 75 87  Resp: 20 20 18  (!) 24  Temp: 98.2 F (36.8 C) 98.1 F (36.7 C) 98.2 F (36.8 C) 97.6 F (36.4 C)  TempSrc: Oral Oral Oral Oral  SpO2: 96% 93% 94% 94%     Wt Readings from Last 3 Encounters:  02/07/20 83.2 kg  01/24/20 80.5 kg  01/17/20 79.9 kg     Intake/Output Summary (Last 24 hours) at 02/08/2020 1147 Last data filed at 02/08/2020 0913 Gross per 24 hour  Intake 788.33 ml  Output -  Net 788.33 ml     Physical Exam  Awake Alert, No new F.N deficits, Normal affect [place].AT,PERRAL Supple Neck,No JVD, No cervical lymphadenopathy appriciated.  Symmetrical Chest wall movement, Good air movement bilaterally, CTAB RRR,No Gallops,Rubs or new Murmurs, No Parasternal Heave +ve B.Sounds, Abd Soft, No tenderness, No organomegaly appriciated, No rebound - guarding or rigidity. No Cyanosis, Clubbing or edema, No new Rash or bruise      Data Review:    CBC Recent Labs  Lab 02/07/20 1710 02/08/20 0327  WBC 9.8 11.2*  HGB 12.4* 14.0  HCT 40.1 44.3  PLT 329 344  MCV 90.9 90.6  MCH 28.1 28.6  MCHC 30.9 31.6  RDW 14.2 14.3  LYMPHSABS  --  2.2  MONOABS  --  0.9  EOSABS  --  0.2  BASOSABS  --  0.1    Recent Labs  Lab 02/07/20 1506 02/07/20 1710 02/07/20 2204 02/08/20 0327  NA 144  --   --  140  K 3.8  --   --  3.3*  CL 101  --   --  96*  CO2 29  --   --  29  GLUCOSE 214*  --   --  118*  BUN 23  --   --  18  CREATININE 1.45* 1.59*  --  1.21  CALCIUM 9.2  --   --  8.9  AST  --   --   --  29  ALT  --   --   --  24  ALKPHOS  --   --   --  92  BILITOT  --   --   --  0.4  ALBUMIN  --   --   --  3.6  CRP  --   --  0.8 <0.5  DDIMER  --   --  0.93* 1.08*  PROCALCITON  --   --  <0.10  --   TSH  --   --  27.067*  --   CORTISOL 18.6  --   --   --     ------------------------------------------------------------------------------------------------------------------ No results for input(s): CHOL, HDL, LDLCALC, TRIG, CHOLHDL, LDLDIRECT in the last 72 hours.  Lab Results  Component Value Date   HGBA1C 6.3 (H) 01/04/2020    ------------------------------------------------------------------------------------------------------------------ Recent Labs    02/07/20 2204  TSH 27.067*    Cardiac Enzymes No results for input(s): CKMB, TROPONINI, MYOGLOBIN in the last 168 hours.  Invalid input(s): CK ------------------------------------------------------------------------------------------------------------------    Component Value Date/Time   BNP 125.3 (H) 01/04/2020 1411    Micro Results Recent Results (from the past 240 hour(s))  SARS Coronavirus 2 by RT PCR (hospital order, performed in [facility] hospital lab) Nasopharyngeal Nasopharyngeal Swab     Status: Abnormal   Collection Time: 02/07/20  4:37 PM   Specimen: Nasopharyngeal Swab  Result Value Ref Range Status   SARS Coronavirus 2 POSITIVE (A) NEGATIVE Final    Comment: RESULT CALLED TO, READ BACK BY AND VERIFIED WITH: I ABRAHAM 02/07/20 AT 1845 (NOTE) SARS-CoV-2 target nucleic acids are DETECTED  SARS-CoV-2 RNA is generally detectable in upper respiratory specimens  during the acute phase of infection.  Positive results are indicative  of the presence of the identified virus, but do not rule out bacterial infection or co-infection with other pathogens not detected by the test.  Clinical correlation with patient history and  other diagnostic information is necessary to determine patient infection status.  The expected result is negative.  Fact Sheet for Patients:   StrictlyIdeas.no   Fact Sheet for Healthcare Providers:   BankingDealers.co.za    This test is not yet approved or cleared by the [place] FDA and  has been authorized for detection and/or diagnosis of SARS-CoV-2 by FDA under an Emergency Use Authorization (EUA).  This EUA will remain in effect (meaning this test can b e used) for the duration of  the COVID-19 declaration under Section 564(b)(1) of the Act, 21 U.S.C. section  360-bbb-3(b)(1), unless the authorization is terminated or revoked sooner.  Performed at [facility], [place] [address], [place], [place] [address]     Radiology Reports DG CHEST PORT 1 VIEW  Result Date: 02/08/2020 CLINICAL DATA:  COVID-19 EXAM: PORTABLE CHEST 1 VIEW COMPARISON:  01/04/2020 FINDINGS: Right hemidiaphragm remains elevated. Decreased size of small left pleural effusion. Improved aeration at the left lung base. No focal airspace consolidation or pulmonary edema. Normal cardiomediastinal contours. Spinal rods. IMPRESSION: Decreased size of small left pleural effusion and improved aeration of the left lung base. Electronically Signed   By: Ulyses Jarred M.D.   On: 02/08/2020 00:36

## 2020-02-08 NOTE — H&P (Signed)
History and Physical    Devin [name] ZFP:[id number] DOB: July 15, 1940 DOA: 02/07/2020  PCP: Prince Solian, MD  Patient coming from: Home  I have personally briefly reviewed patient's old medical records in [place]  Chief Complaint: Orthostatic hypotension  HPI: Devin [name] is a 79 y.o. male with medical history significant of NSCLC in remission, Crohn's dz, DM2, HTN, hypothyroidism.  Pt was admitted to [facility] for evaluation of SOB back at end of July.  Seen as outpt by PCP prior to that and treated with ABx for PNA.  Despite that he continued to complain of SOB and chest pressure and went to ED.  In ED, trops were 219 and 299, CTA of chest showed no acute pathology, COVID test at that time was negative.  Cards saw pt: echo showed nl LVEF, lexiscan was also normal.  Since DC he has continued to feel generally weak though slowly improving he says.  Denies feeling any worse, denies cough, fever, worsening SOB, etc.  No syncope, no CP.  Went to cards office for follow up appointment today.  Cardiologist was concerned because he was VERY orthostatic in office, dropping BP on standing.  Pt sent to hospital for admission.   ED Course: Admission labs show mild AKI with creat 1.5 up from 1.0 last month.  TSH is now 27 up from 0.126 last month (at which point they cut his synthroid from 251mg daily to 1029m daily).  Trops 105 and 167.  COVID test has come back positive, prompting cardiologist to ask usKoreao take over care of the patient.  Patient HAS been vaccinated with 2 doses of the Moderna earlier this year.   Review of Systems: As per HPI, otherwise all review of systems negative.  Past Medical History:  Diagnosis Date  . Adenocarcinoma of left lung, stage 1 ([facility]/30/2016  . Ankylosing spondylitis ([facility]   Diagnosed during lumbar fracture summer of 2016    . Crohn's disease ([facility] [place]  . Gout   . HIstory of basal cell cancer of face    THYROID CA HX  .  Hypertension   . Hypocalcemia 04/13/2007  . Hypothyroidism   . Impotence   . 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 Obesity (BMI 30-39.9)   . Osteoporosis    Pt completed 5 years of fosamax in 2013     . Prostate cancer with recurrence    Treated with prostatectomy with recurrence 2012 with Lupron treatment now him   . Psoriasis   . Thyroid cancer ([facility]   Treated with RAI and total thyroidectomy   . Type II diabetes mellitus ([facility] [place]    Past Surgical History:  Procedure Laterality Date  . ABDOMINAL EXPLORATION SURGERY     for small bowel obstruction  . APPENDECTOMY  03/2007  . BACK SURGERY    . BASAL CELL CARCINOMA EXCISION  "several"   "head"  . CARDIAC CATHETERIZATION  03/17/2003  . CHOLECYSTECTOMY N/A 05/07/2016   Procedure: LAPAROSCOPIC CHOLECYSTECTOMY;  Surgeon: HaFanny SkatesMD;  [facility] [place] [facility] Service: General;  Laterality: N/A;  . COLON SURGERY  03/2007   Resection of cecum, appendix, terminal ileum (approximately/notes 10/10/2010  . HERNIA REPAIR    . LAPAROSCOPIC CHOLECYSTECTOMY  05/07/2016  . LAPAROSCOPIC LYSIS OF ADHESIONS  05/07/2016  . LAPAROSCOPIC LYSIS OF ADHESIONS N/A 05/07/2016   Procedure: LAPAROSCOPIC LYSIS OF ADHESIONS TIMES ONE HOUR;  Surgeon: HaFanny SkatesMD;  Location: [facility] Service: General;  Laterality: N/A;  . POSTERIOR FUSION THORACIC SPINE  02/08/2016   1. Posterior thoracic arthrodesis T7-T11 utilizing morcellized allograft, 2. Posterior thoracic segmental fixation T7-T11 utilizing nuvasive pedicle screws  . PROSTATECTOMY  06/2001   w/bilateral pelvic lymph nose dissection/notes 10/24/2010  . SPINAL FUSION  12/2014   Open reduction internal fixation of L1 [place] fracture with posterior fusion T10-L4 utilizing morcellized allograft and some local autograft, segmental instrumentation T10-L4 inclusive utilizing nuvasive pedicle screws/notes 12/16/2014  . Stress Cardiolite  02/17/2003  . THOROCOTOMY WITH LOBECTOMY   03/16/2015   Procedure: THOROCOTOMY WITH LOBECTOMY;  Surgeon: Ivin Poot, MD;  Location: [place];  Service: Thoracic;;  . TONSILLECTOMY    . TOTAL THYROIDECTOMY  1997  . Venous Doppler  05/30/2004  . VENTRAL HERNIA REPAIR  04/14/2008  . VIDEO ASSISTED THORACOSCOPY Left 03/16/2015   Procedure: VIDEO ASSISTED THORACOSCOPY;  Surgeon: Ivin Poot, MD;  Location: [place];  Service: Thoracic;  Laterality: Left;     reports that he quit smoking about 30 years ago. His smoking use included cigarettes. He has a 60.00 pack-year smoking history. He has never used smokeless tobacco. He reports current alcohol use. He reports that he does not use drugs.  No Known Allergies  Family History  Problem Relation Age of Onset  . CAD Mother   . Cancer Neg Hx        No cancer in the patient's immediate family, except of course for the patient himself, as noted     Prior to Admission medications   Medication Sig Start Date End Date Taking? Authorizing Provider  ASACOL HD 800 MG TBEC Take 1,600 mg by mouth 2 (two) times daily. 11/14/14  Yes [provider]  aspirin 81 MG EC tablet Take 1 tablet (81 mg total) by mouth daily. Swallow whole. 01/06/20  Yes Swayze, Ava, DO  atenolol (TENORMIN) 25 MG tablet Take 0.5 mg by mouth. 05/17/16  Yes [provider]  B-D ULTRAFINE III SHORT PEN 31G X 8 MM MISC USE 1 PEN NEEDLE FOUR TIMES DAILY. 09/18/16  Yes English, [name] D, PA  busPIRone (BUSPAR) 15 MG tablet Take 15 mg by mouth daily.  05/17/16  Yes [provider]  calcium-vitamin D (OSCAL WITH D) 500-200 MG-UNIT tablet Take 1 tablet by mouth daily.   Yes [provider]  Glucose Blood (ASCENSIA CONTOUR TEST VI) Use as directed to test two times a day    Yes [provider]  glucose blood (BAYER CONTOUR TEST) test strip TEST BLOOD SUGAR TWICE DAILY AS DIRECTED. 05/17/16  Yes Weber, Sarah L, PA-C  glucose blood ([employer] TEST STRIPS) test strip 1 each by Other route 2 times  daily. 05/17/16  Yes [provider]  insulin lispro (HUMALOG KWIKPEN) 100 UNIT/ML KiwkPen INJECT UNDER THE SKIN THREE TIMES DAILY( 25 UNITS, 25 UNITS AND 20 UNITS RESPECTIVELY BEFORE MEALS) Patient taking differently: Inject 20-30 Units into the skin See admin instructions. INJECT UNDER THE SKIN THREE TIMES DAILY( 25 units in the morning, 20 units in the afternoon, 30 units in the evening per patient. 05/17/16  Yes Weber, Sarah L, PA-C  Insulin Pen Needle (FIFTY50 PEN NEEDLES) 31G X 8 MM MISC Inject 31 g into the skin. 09/18/16  Yes [provider]  leuprolide (LUPRON) 11.25 MG injection Inject 11.25 mg into the muscle every 6 (six) months.    Yes [provider]  levothyroxine (SYNTHROID) 200 MCG tablet Take 0.5 tablets (100 mcg total) by mouth daily before breakfast.  01/06/20  Yes Swayze, Ava, DO  Multiple Vitamins-Iron (DAILY-VITE/IRON/BETA-CAROTENE) TABS Take 1 tablet by mouth daily.    Yes [provider]  insulin lispro (HUMALOG) 100 UNIT/ML KiwkPen Inject into the skin. Patient not taking: Reported on 02/07/2020 05/17/16   [provider]  Nutritional Supplements (,FEEDING SUPPLEMENT, PROSOURCE PLUS) liquid Take 30 mLs by mouth 2 (two) times daily between meals. Patient not taking: Reported on 02/07/2020 01/06/20   Swayze, Ava, DO  Nutritional Supplements (FEEDING SUPPLEMENT, KATE FARMS STANDARD 1.4,) LIQD liquid Take 325 mLs by mouth 2 (two) times daily between meals. Patient not taking: Reported on 02/07/2020 01/06/20   Karie Kirks, DO    Physical Exam: Vitals:   02/07/20 1630 02/07/20 1632 02/07/20 1634 02/07/20 2027  BP: 136/71 112/61 (!) 86/52 (!) 159/82  Pulse: 96 98 (!) 102 95  Resp: 18 20 20 20   Temp: 98.3 F (36.8 C)   98.2 F (36.8 C)  TempSrc: Oral   Oral  SpO2: 97% 98% 97% 96%    Constitutional: NAD, calm, comfortable Eyes: PERRL, lids and conjunctivae normal ENMT: Mucous membranes are moist. Posterior pharynx clear of any exudate  or lesions.Normal dentition.  Neck: normal, supple, no masses, no thyromegaly Respiratory: clear to auscultation bilaterally, no wheezing, no crackles. Normal respiratory effort. No accessory muscle use.  Cardiovascular: Regular rate and rhythm, no murmurs / rubs / gallops. No extremity edema. 2+ pedal pulses. No carotid bruits.  Abdomen: no tenderness, no masses palpated. No hepatosplenomegaly. Bowel sounds positive.  Musculoskeletal: no clubbing / cyanosis. No joint deformity upper and lower extremities. Good ROM, no contractures. Normal muscle tone.  Skin: no rashes, lesions, ulcers. No induration Neurologic: CN 2-12 grossly intact. Sensation intact, DTR normal. Strength 5/5 in all 4.  Psychiatric: Normal judgment and insight. Alert and oriented x 3. Normal mood.    Labs on Admission: I have personally reviewed following labs and imaging studies  CBC: Recent Labs  Lab 02/07/20 1710  WBC 9.8  HGB 12.4*  HCT 40.1  MCV 90.9  PLT 500   Basic Metabolic Panel: Recent Labs  Lab 02/07/20 1506 02/07/20 1710  NA 144  --   K 3.8  --   CL 101  --   CO2 29  --   GLUCOSE 214*  --   BUN 23  --   CREATININE 1.45* 1.59*  CALCIUM 9.2  --    GFR: Estimated Creatinine Clearance: 40.9 mL/min (A) (by C-G formula based on SCr of 1.59 mg/dL (H)). Liver Function Tests: No results for input(s): AST, ALT, ALKPHOS, BILITOT, PROT, ALBUMIN in the last 168 hours. No results for input(s): LIPASE, AMYLASE in the last 168 hours. No results for input(s): AMMONIA in the last 168 hours. Coagulation Profile: No results for input(s): INR, PROTIME in the last 168 hours. Cardiac Enzymes: No results for input(s): CKTOTAL, CKMB, CKMBINDEX, TROPONINI in the last 168 hours. BNP (last 3 results) No results for input(s): PROBNP in the last 8760 hours. HbA1C: No results for input(s): HGBA1C in the last 72 hours. CBG: Recent Labs  Lab 02/07/20 1809 02/07/20 2031  GLUCAP 191* 164*   Lipid Profile: No  results for input(s): CHOL, HDL, LDLCALC, TRIG, CHOLHDL, LDLDIRECT in the last 72 hours. Thyroid Function Tests: Recent Labs    02/07/20 2204  TSH 27.067*   Anemia Panel: No results for input(s): VITAMINB12, FOLATE, FERRITIN, TIBC, IRON, RETICCTPCT in the last 72 hours. Urine analysis:    Component Value Date/Time   COLORURINE YELLOW 03/05/2016 1203  APPEARANCEUR [place] (A) 03/05/2016 1203   LABSPEC 1.020 03/05/2016 1203   PHURINE 5.5 03/05/2016 1203   GLUCOSEU NEGATIVE 03/05/2016 1203   HGBUR NEGATIVE 03/05/2016 1203   [place] 03/05/2016 [place] 03/05/2016 [place] 02/08/2016 1204   UROBILINOGEN 0.2 03/05/2016 1203   NITRITE NEGATIVE 03/05/2016 [place] 03/05/2016 1203    Radiological Exams on Admission: No results found.  EKG: Independently reviewed.  Assessment/Plan Principal Problem:   Orthostatic hypotension Active Problems:   Postsurgical hypothyroidism   DM (diabetes mellitus), type 2 with renal complications (HCC)   Essential hypertension   Ankylosing spondylitis (HCC)   Crohn's disease ([place])   Adenocarcinoma of left lung, stage 1 ([place])    1. Orthostatic hypotension - 1. Give 1L NS overnight 2. Holding atenolol 3. PM cortisol looks okay 4. Address hypothyroidism (see below) 5. Tele monitor 6. Serial trops 7. Cards on board 2. Mild AKI - 1. Related to hypotension above? 2. Hold atenolol 3. IVF: 1L as above 4. Strict intake and output 5. Repeat BMP in AM 3. Positive COVID-19 test - 1. Unclear if this is actually causing active disease or not in this vaccinated patient. 2. HPI is suggestive this may not be the cause of his recent issues. 1. Per pt symptoms actually improving slowly since last hospital stay. 3. No O2 requirement, no fever 4. Checking CXR 5. Defer wether to start MAB therapy or not to day team. 6. Daily labs 4. Hypothyroidism - 1. Synthroid recently decreased  from 232mg daily to 103m daily after he was found to be HYPERthyroid during July admit. 2. Now pt is HYPOthyroid with elevated TSH today 3. Will increase Synthroid from 10046mto 125m12m5. HTN - 1. Holding atenolol 6. DM2 - 1. Holding home humalog in setting of AKI 2. Sensitive SSI AC/HS 3. And 3u novolog mealtime 4. May need to increase... 7. Crohn's dz - 1. Cont melsalamine 2. Pt is not on any other immunosuppressive medications for this or for ankylosing spondylitis. 8. NSCLC - believed to be in remission  DVT prophylaxis: Heparin [place] Code Status: Full Family Communication: No family in room Disposition Plan: Home after admit Consults called: Dr. RossHarrington Challengerission status: Place in obs    Markee Matera, JAREGrand Prairiepitalists  How to contact the [facility] Surgeryending or Consulting provider 7A -[place] provider during after hours 7P -[place] this patient?  1. Check the care team in [facility] look for a) attending/consulting [facility] provider listed and b) the [facility] listed 2. Log into [email or web address]  Amion Physician Scheduling and messaging for groups and whole hospitals  On call and physician scheduling software for group practices, residents, hospitalists and other medical providers for call, clinic, rotation and shift schedules. OnCall Enterprise is a hospital-wide system for scheduling doctors and paging doctors on call. EasyPlot is for scientific plotting and data analysis.  [email or web address]  and use [place]'s universal password to access. If you do not have the password, please contact the hospital operator.  3. Locate the TR[facility] LLCvider you are looking for under [facility] and page to a number that you can be directly reached. 4. If you still have difficulty reaching the provider, please page the DOC [facility] Associationrector on Call) for the Hospitalists listed on amion for assistance.  02/08/2020, 12:10 AM

## 2020-02-08 NOTE — Progress Notes (Signed)
   02/07/20 2027  Vitals  Temp 98.2 F (36.8 C)  Temp Source Oral  BP (!) 159/82  MAP (mmHg) 106  BP Location Right Arm  BP Method Automatic  Patient Position (if appropriate) Sitting  Pulse Rate 95  Pulse Rate Source Monitor  ECG Heart Rate 95  Resp 20  Level of Consciousness  Level of Consciousness Alert  MEWS COLOR  MEWS Score Color Green  Oxygen Therapy  SpO2 96 %  O2 Device Room Air  Pain Assessment  Pain Scale 0-10  Pain Score 0  MEWS Score  MEWS Temp 0  MEWS Systolic 0  MEWS Pulse 0  MEWS RR 0  MEWS LOC 0  MEWS Score 0  Pt arrived to floor transported by bed, ambulated with stand-by assist. Pt tolerated well with no light-headedness or dizziness. Pt with no complaints. Educated pt on reason for admission to 5W D/T COVID status.  Pt in no obvious distress. Call light within reach. Will continue to monitor pt. Norton Blizzard, RN

## 2020-02-09 LAB — CBC WITH DIFFERENTIAL/PLATELET
Abs Immature Granulocytes: 0.04 10*3/uL (ref 0.00–0.07)
Basophils Absolute: 0.1 10*3/uL (ref 0.0–0.1)
Basophils Relative: 1 %
Eosinophils Absolute: 0.2 10*3/uL (ref 0.0–0.5)
Eosinophils Relative: 2 %
HCT: 42.4 % (ref 39.0–52.0)
Hemoglobin: 13.3 g/dL (ref 13.0–17.0)
Immature Granulocytes: 0 %
Lymphocytes Relative: 17 %
Lymphs Abs: 1.5 10*3/uL (ref 0.7–4.0)
MCH: 28 pg (ref 26.0–34.0)
MCHC: 31.4 g/dL (ref 30.0–36.0)
MCV: 89.3 fL (ref 80.0–100.0)
Monocytes Absolute: 0.6 10*3/uL (ref 0.1–1.0)
Monocytes Relative: 7 %
Neutro Abs: 6.6 10*3/uL (ref 1.7–7.7)
Neutrophils Relative %: 73 %
Platelets: 316 10*3/uL (ref 150–400)
RBC: 4.75 MIL/uL (ref 4.22–5.81)
RDW: 14.3 % (ref 11.5–15.5)
WBC: 9 10*3/uL (ref 4.0–10.5)
nRBC: 0 % (ref 0.0–0.2)

## 2020-02-09 LAB — SAR COV2 SEROLOGY (COVID19)AB(IGG),IA: SARS-CoV-2 Ab, IgG: REACTIVE — AB

## 2020-02-09 LAB — COMPREHENSIVE METABOLIC PANEL
ALT: 19 U/L (ref 0–44)
AST: 27 U/L (ref 15–41)
Albumin: 3.1 g/dL — ABNORMAL LOW (ref 3.5–5.0)
Alkaline Phosphatase: 86 U/L (ref 38–126)
Anion gap: 12 (ref 5–15)
BUN: 12 mg/dL (ref 8–23)
CO2: 35 mmol/L — ABNORMAL HIGH (ref 22–32)
Calcium: 8.4 mg/dL — ABNORMAL LOW (ref 8.9–10.3)
Chloride: 93 mmol/L — ABNORMAL LOW (ref 98–111)
Creatinine, Ser: 1.21 mg/dL (ref 0.61–1.24)
GFR calc Af Amer: >60 mL/min (ref >60)
GFR calc non Af Amer: 57 mL/min — ABNORMAL LOW (ref >60)
Glucose, Bld: 81 mg/dL (ref 70–99)
Potassium: 3.1 mmol/L — ABNORMAL LOW (ref 3.5–5.1)
Sodium: 140 mmol/L (ref 135–145)
Total Bilirubin: 0.5 mg/dL (ref 0.3–1.2)
Total Protein: 6.4 g/dL — ABNORMAL LOW (ref 6.5–8.1)

## 2020-02-09 LAB — GLUCOSE, CAPILLARY: Glucose-Capillary: 107 mg/dL — ABNORMAL HIGH (ref 70–99)

## 2020-02-09 LAB — C-REACTIVE PROTEIN: CRP: 0.5 mg/dL (ref <1.0)

## 2020-02-09 LAB — T3: T3, Total: 51 ng/dL — ABNORMAL LOW (ref 71–180)

## 2020-02-09 LAB — MAGNESIUM: Magnesium: 1.6 mg/dL — ABNORMAL LOW (ref 1.7–2.4)

## 2020-02-09 LAB — D-DIMER, QUANTITATIVE: D-Dimer, Quant: 0.8 ug/mL-FEU — ABNORMAL HIGH (ref 0.00–0.50)

## 2020-02-09 MED ORDER — ATENOLOL 25 MG PO TABS: 25.0000 mg | ORAL_TABLET | Freq: Two times a day (BID) | ORAL | Status: DC

## 2020-02-09 MED ORDER — MAGNESIUM SULFATE 2 GM/50ML IV SOLN
2.0000 g | Freq: Once | INTRAVENOUS | Status: AC
  Administered 2020-02-09: 2 g via INTRAVENOUS
  Filled 2020-02-09: qty 50

## 2020-02-09 MED ORDER — ATENOLOL 50 MG PO TABS
25.0000 mg | ORAL_TABLET | Freq: Two times a day (BID) | ORAL | Status: DC
  Administered 2020-02-09: 25 mg via ORAL
  Filled 2020-02-09: qty 1

## 2020-02-09 MED ORDER — POTASSIUM CHLORIDE CRYS ER 20 MEQ PO TBCR
40.0000 meq | EXTENDED_RELEASE_TABLET | Freq: Once | ORAL | Status: AC
  Administered 2020-02-09: 40 meq via ORAL
  Filled 2020-02-09: qty 2

## 2020-02-09 MED ORDER — LEVOTHYROXINE SODIUM 125 MCG PO TABS: 125.0000 ug | ORAL_TABLET | Freq: Every day | ORAL | 0 refills | Status: DC

## 2020-02-09 NOTE — Discharge Instructions (Signed)
Follow with Primary MD Avva, Ravisankar, MD in 7 days   Get CBC, CMP, 2 view Chest X ray -  checked next visit within 1 week by Primary MD    Activity: As tolerated with Full fall precautions use walker/cane & assistance as needed  Disposition Home    Diet: Heart Healthy - Low Carb  Accuchecks 4 times/day, Once in AM empty stomach and then before each meal. Log in all results and show them to your Prim.MD in 3 days. If any glucose reading is under 80 or above 300 call your Prim MD immidiately. Follow Low glucose instructions for glucose under 80 as instructed.   Special Instructions: If you have smoked or chewed Tobacco  in the last 2 yrs please stop smoking, stop any regular Alcohol  and or any Recreational drug use.  On your next visit with your primary care physician please Get Medicines reviewed and adjusted.  Please request your Prim.MD to go over all Hospital Tests and Procedure/Radiological results at the follow up, please get all Hospital records sent to your Prim MD by signing hospital release before you go home.  If you experience worsening of your admission symptoms, develop shortness of breath, life threatening emergency, suicidal or homicidal thoughts you must seek medical attention immediately by calling 911 or calling your MD immediately  if symptoms less severe.  You Must read complete instructions/literature along with all the possible adverse reactions/side effects for all the Medicines you take and that have been prescribed to you. Take any new Medicines after you have completely understood and accpet all the possible adverse reactions/side effects.       Person Under Monitoring Name: Devin [name]  Location: [address] [place] [address]   Infection Prevention Recommendations for Individuals Confirmed to have, or Being Evaluated for, 2019 Novel Coronavirus (COVID-19) Infection Who Receive Care at Home  Individuals who are confirmed to have,  or are being evaluated for, COVID-19 should follow the prevention steps below until a healthcare provider or local or state health department says they can return to normal activities.  Stay home except to get medical care You should restrict activities outside your home, except for getting medical care. Do not go to work, school, or public areas, and do not use public transportation or taxis.  Call ahead before visiting your doctor Before your medical appointment, call the healthcare provider and tell them that you have, or are being evaluated for, COVID-19 infection. This will help the healthcare provider's office take steps to keep other people from getting infected. Ask your healthcare provider to call the local or state health department.  Monitor your symptoms Seek prompt medical attention if your illness is worsening (e.g., difficulty breathing). Before going to your medical appointment, call the healthcare provider and tell them that you have, or are being evaluated for, COVID-19 infection. Ask your healthcare provider to call the local or state health department.  Wear a facemask You should wear a facemask that covers your nose and mouth when you are in the same room with other people and when you visit a healthcare provider. People who live with or visit you should also wear a facemask while they are in the same room with you.  Separate yourself from other people in your home As much as possible, you should stay in a different room from other people in your home. Also, you should use a separate bathroom, if available.  Avoid sharing household items You should not share  dishes, drinking glasses, cups, eating utensils, towels, bedding, or other items with other people in your home. After using these items, you should wash them thoroughly with soap and water.  Cover your coughs and sneezes Cover your mouth and nose with a tissue when you cough or sneeze, or you can cough or  sneeze into your sleeve. Throw used tissues in a lined trash can, and immediately wash your hands with soap and water for at least 20 seconds or use an alcohol-based hand rub.  Wash your [facility] your hands often and thoroughly with soap and water for at least 20 seconds. You can use an alcohol-based hand sanitizer if soap and water are not available and if your hands are not visibly dirty. Avoid touching your eyes, nose, and mouth with unwashed hands.   Prevention Steps for Caregivers and Household Members of Individuals Confirmed to have, or Being Evaluated for, COVID-19 Infection Being Cared for in the Home  If you live with, or provide care at home for, a person confirmed to have, or being evaluated for, COVID-19 infection please follow these guidelines to prevent infection:  Follow healthcare provider's instructions Make sure that you understand and can help the patient follow any healthcare provider instructions for all care.  Provide for the patient's basic needs You should help the patient with basic needs in the home and provide support for getting groceries, prescriptions, and other personal needs.  Monitor the patient's symptoms If they are getting sicker, call his or her medical provider and tell them that the patient has, or is being evaluated for, COVID-19 infection. This will help the healthcare provider's office take steps to keep other people from getting infected. Ask the healthcare provider to call the local or state health department.  Limit the number of people who have contact with the patient  If possible, have only one caregiver for the patient.  Other household members should stay in another home or place of residence. If this is not possible, they should stay  in another room, or be separated from the patient as much as possible. Use a separate bathroom, if available.  Restrict visitors who do not have an essential need to be in the home.  Keep older  adults, very young children, and other sick people away from the patient Keep older adults, very young children, and those who have compromised immune systems or chronic health conditions away from the patient. This includes people with chronic heart, lung, or kidney conditions, diabetes, and cancer.  Ensure good ventilation Make sure that shared spaces in the home have good air flow, such as from an air conditioner or an opened window, weather permitting.  Wash your hands often  Wash your hands often and thoroughly with soap and water for at least 20 seconds. You can use an alcohol based hand sanitizer if soap and water are not available and if your hands are not visibly dirty.  Avoid touching your eyes, nose, and mouth with unwashed hands.  Use disposable paper towels to dry your hands. If not available, use dedicated cloth towels and replace them when they become wet.  Wear a facemask and gloves  Wear a disposable facemask at all times in the room and gloves when you touch or have contact with the patient's blood, body fluids, and/or secretions or excretions, such as sweat, saliva, sputum, nasal mucus, vomit, urine, or feces.  Ensure the mask fits over your nose and mouth tightly, and do not touch it during  use.  Throw out disposable facemasks and gloves after using them. Do not reuse.  Wash your hands immediately after removing your facemask and gloves.  If your personal clothing becomes contaminated, carefully remove clothing and launder. Wash your hands after handling contaminated clothing.  Place all used disposable facemasks, gloves, and other waste in a lined container before disposing them with other household waste.  Remove gloves and wash your hands immediately after handling these items.  Do not share dishes, glasses, or other household items with the patient  Avoid sharing household items. You should not share dishes, drinking glasses, cups, eating utensils, towels,  bedding, or other items with a patient who is confirmed to have, or being evaluated for, COVID-19 infection.  After the person uses these items, you should wash them thoroughly with soap and water.  Wash laundry thoroughly  Immediately remove and wash clothes or bedding that have blood, body fluids, and/or secretions or excretions, such as sweat, saliva, sputum, nasal mucus, vomit, urine, or feces, on them.  Wear gloves when handling laundry from the patient.  Read and follow directions on labels of laundry or clothing items and detergent. In general, wash and dry with the warmest temperatures recommended on the label.  Clean all areas the individual has used often  Clean all touchable surfaces, such as counters, tabletops, doorknobs, bathroom fixtures, toilets, phones, keyboards, tablets, and bedside tables, every day. Also, clean any surfaces that may have blood, body fluids, and/or secretions or excretions on them.  Wear gloves when cleaning surfaces the patient has come in contact with.  Use a diluted bleach solution (e.g., dilute bleach with 1 part bleach and 10 parts water) or a household disinfectant with a label that says EPA-registered for coronaviruses. To make a bleach solution at home, add 1 tablespoon of bleach to 1 quart (4 cups) of water. For a larger supply, add  cup of bleach to 1 gallon (16 cups) of water.  Read labels of cleaning products and follow recommendations provided on product labels. Labels contain instructions for safe and effective use of the cleaning product including precautions you should take when applying the product, such as wearing gloves or eye protection and making sure you have good ventilation during use of the product.  Remove gloves and wash hands immediately after cleaning.  Monitor yourself for signs and symptoms of illness Caregivers and household members are considered close contacts, should monitor their health, and will be asked to  limit movement outside of the home to the extent possible. Follow the monitoring steps for close contacts listed on the symptom monitoring form.   ? If you have additional questions, contact your local health department or call the epidemiologist on call at [phone] (available 24/7). ? This guidance is subject to change. For the most up-to-date guidance from [facility], please refer to their website: [email or web address]

## 2020-02-09 NOTE — Progress Notes (Signed)
OT Cancellation Note  Patient Details Name: Devin [name] MRN: [id number] DOB: 1941-02-07   Cancelled Treatment:    Reason Eval/Treat Not Completed: OT screened, no needs identified, will sign off  Nikolai, OT/L   Acute OT Clinical Specialist Acute Rehabilitation Services Pager [phone] Office [phone]  02/09/2020, 9:18 AM

## 2020-02-09 NOTE — Progress Notes (Signed)
    Patient discharged.  Off of low dose beta blocker.  Allowing some permissive HTN given the orthostasis.  We will arrrange probably a virtual follow up.

## 2020-02-09 NOTE — Evaluation (Signed)
Physical Therapy Evaluation & Discharge Patient Details Name: Devin [name] MRN: [id number] DOB: 1941/04/22 Today's Date: 02/09/2020   History of Present Illness  Pt is a 79 y.o. male with recent PNA workup (12/2019) and NSTEMI (01/2020) with return home, now admitted 02/07/20 after presenting to cardiologist with c/o weakness with orthostatic hypotension. Workup for dehydration and incidental (+) COVID-19. PMH includes DM2, HTN, NSCLC in remission. Pt vaccinated.    Clinical Impression  Patient evaluated by Physical Therapy with no further acute PT needs identified. PTA, pt independent, active and lives with spouse. Today, pt independent with mobility and ADL tasks. Asymptomatic throughout session. Educ on strategies for overcoming symptoms of orthostatic hypotension. SpO2 >/94% on RA with ambulation. All education has been completed and the patient has no further questions. Acute PT is signing off. Thank you for this referral.  Orthostatic BPs Sitting 129/73  Standing 125/69  Post-ambulation 127/77      Follow Up Recommendations No PT follow up    Equipment Recommendations  None recommended by PT    Recommendations for Other Services       Precautions / Restrictions Precautions Precautions: Other (comment) Precaution Comments: h/o Chrohn's disease with BM urgency Restrictions Weight Bearing Restrictions: No      Mobility  Bed Mobility               General bed mobility comments: Received sitting in recliner  Transfers Overall transfer level: Independent Equipment used: None             General transfer comment: BP stable, asymptomatic  Ambulation/Gait Ambulation/Gait assistance: Independent Gait Distance (Feet): 340 Feet Assistive device: None Gait Pattern/deviations: Step-through pattern;Decreased stride length     General Gait Details: Slow, steady gait, indep with and without pushing IV pole. SpO2 94% on RA  Stairs            Wheelchair  Mobility    Modified Rankin (Stroke Patients Only)       Balance Overall balance assessment: No apparent balance deficits (not formally assessed)                                           Pertinent Vitals/Pain Pain Assessment: No/denies pain    Home Living Family/patient expects to be discharged to:: Private residence Living Arrangements: Spouse/significant other Available Help at Discharge: Family;Available 24 hours/day Type of Home: House Home Access: Stairs to enter Entrance Stairs-Rails: Right Entrance Stairs-Number of Steps: 3 Home Layout: One level Home Equipment: Walker - 2 wheels;Bedside commode      Prior Function Level of Independence: Independent               Hand Dominance   Dominant Hand: Right    Extremity/Trunk Assessment   Upper Extremity Assessment Upper Extremity Assessment: Overall WFL for tasks assessed    Lower Extremity Assessment Lower Extremity Assessment: Overall WFL for tasks assessed    Cervical / Trunk Assessment Cervical / Trunk Assessment: Kyphotic  Communication   Communication: No difficulties  Cognition Arousal/Alertness: Awake/alert Behavior During Therapy: WFL for tasks assessed/performed Overall Cognitive Status: Within Functional Limits for tasks assessed                                        General Comments General comments (skin integrity, edema, etc.):  Educ on strategies for overcoming and safety precautions with h/o orthostatic hypotension    Exercises     Assessment/Plan    PT Assessment Patent does not need any further PT services  PT Problem List         PT Treatment Interventions      PT Goals (Current goals can be found in the Care Plan section)  Acute Rehab PT Goals PT Goal Formulation: All assessment and education complete, DC therapy    Frequency     Barriers to discharge        Co-evaluation               AM-PAC PT "6 Clicks" Mobility   Outcome Measure Help needed turning from your back to your side while in a flat bed without using bedrails?: None Help needed moving from lying on your back to sitting on the side of a flat bed without using bedrails?: None Help needed moving to and from a bed to a chair (including a wheelchair)?: None Help needed standing up from a chair using your arms (e.g., wheelchair or bedside chair)?: None Help needed to walk in hospital room?: None Help needed climbing 3-5 steps with a railing? : None 6 Click Score: 24    End of Session   Activity Tolerance: Patient tolerated treatment well Patient left: in chair;with call bell/phone within reach Nurse Communication: Mobility status PT Visit Diagnosis: Other abnormalities of gait and mobility (R26.89)    Time: 6294-7654 PT Time Calculation (min) (ACUTE ONLY): 22 min   Charges:   PT Evaluation $PT Eval Moderate Complexity: [place], PT, DPT Acute Rehabilitation Services  Pager [phone] Office [place] 02/09/2020, 10:29 AM

## 2020-02-09 NOTE — Discharge Summary (Signed)
Devin [name]:[id number] DOB: 07/05/1940 DOA: 02/07/2020  PCP: Prince Solian, MD  Admit date: 02/07/2020  Discharge date: 02/09/2020  Admitted From: Home   Disposition:  Home   Recommendations for Outpatient Follow-up:   Follow up with PCP in 1-2 weeks  PCP Please obtain BMP/CBC, 2 view CXR in 1week,  (see Discharge instructions)   PCP Please follow up on the following pending results: Check - TSH, Free T4, T3, CBC, Mag, CMP in 1 week   Home Health: HHPT, RN Equipment/Devices: None  Consultations: Cards Discharge Condition: Stable    CODE STATUS: Full    Diet Recommendation: Heart Healthy Low CArb  Diet Order            Diet Carb Modified Fluid consistency: Thin; Room service appropriate? Yes  Diet effective now                  CC - Weakness   Brief history of present illness from the day of admission and additional interim summary     Devin [name] a 79 y.o.malewith medical history significant ofNSCLC in remission, Crohn's dz, DM2, HTN, hypothyroidism. Pt was admitted to [facility] for evaluation of SOB back at end of July. Seen as outpt by PCP prior to that and treated with ABx for PNA. Despite that he continued to complain of SOB and chest pressure and went to ED, he was diagnosed with NSTEMI seen by cardiology underwent echo with normal EF and a Lexiscan which was unremarkable he was subsequently discharged home where he continued to feel weak, he went to see Dr. Dorris Carnes his cardiologist on 02/07/2020 was very weak and was sent to the hospital for admission.  The hospital he was found to be slightly dehydrated and incidentally positive for COVID-19, of note he is vaccinated.  He was admitted for treatment of incidental COVID-19 infection, dehydration and generalized weakness.                                                                  Hospital Course   1. Incidental COVID-19 infection in a vaccinated patient - he has no COVID-19 symptoms, CRP is stable, chest x-ray is unimpressive, he has no shortness of breath or cough, he had mild incidental COVID-19 infection for which he received Covid antibody cocktail, stable inflammatory markers and remains symptom-free will be discharged home with PCP follow-up.   SpO2: 93 %  Recent Labs  Lab 02/07/20 1637 02/07/20 2204 02/08/20 0327 02/09/20 0355  CRP  --  0.8 <0.5 0.5  DDIMER  --  0.93* 1.08* 0.80*  PROCALCITON  --  <0.10  --   --   SARSCOV2NAA POSITIVE*  --   --   --     Hepatic Function Latest Ref Rng & Units 02/09/2020 02/08/2020  01/05/2020  Total Protein 6.5 - 8.1 g/dL 6.4(L) 6.8 5.9(L)  Albumin 3.5 - 5.0 g/dL 3.1(L) 3.6 3.1(L)  AST 15 - 41 U/L _0 ALT 0 - 44 U/L _1 Alk Phosphatase 38 - 126 U/L 86 92 70  Total Bilirubin 0.3 - 1.2 mg/dL 0.5 0.4 0.6  Bilirubin, Direct 0.0 - 0.3 mg/dL - - -      2.  Recent NSTEMI with unremarkable cardiology work-up by the cardiology team.  No acute symptoms, troponin levels are flat and non-ACS pattern, cardiology was on board, for now placed on home dose aspirin and low-dose beta-blocker for secondary prevention.  Of note he has not been placed on statin by cardiology.  Currently chest pain-free.  Will be discharged with outpatient follow-up with his PCP and primary cardiologist.  Symptom-free here.  3.  Generalized weakness and dehydration.    Much improved after gentle IV fluids, PT OT, electrolyte replacement.  Of note TSH was quite elevated see #4 below.  4.  History of thyroid resection with  hypothyroidism.  TSH was quite high at around 25 with symptoms of weakness and fatigue, it had been low in the past, I have increased her Synthroid dose to 125 mcg from 100 mcg, his free T4 is stable and has a low T3.  We will have him follow with his primary endocrinologist  within a week.  5.  History of Crohn's disease.  Stable, continue Asacol.  6.  History of lung cancer with part of left lung removed.  Stable.  7.    Hypokalemia and hypomagnesemia.  Both replaced.  PCP to recheck and monitor.    8. DM 2 - continue home Rx  Lab Results  Component Value Date   HGBA1C 6.3 (H) 01/04/2020     Discharge diagnosis     Principal Problem:   Orthostatic hypotension Active Problems:   Postsurgical hypothyroidism   DM (diabetes mellitus), type 2 with renal complications (HCC)   Essential hypertension   Ankylosing spondylitis (HCC)   Crohn's disease ([place])   Adenocarcinoma of left lung, stage 1 ([place])    Discharge instructions    Discharge Instructions    Discharge instructions   Complete by: As directed    Follow with Primary MD Avva, Ravisankar, MD in 7 days   Get CBC, CMP, 2 view Chest X ray -  checked next visit within 1 week by Primary MD    Activity: As tolerated with Full fall precautions use walker/cane & assistance as needed  Disposition Home    Diet: Heart Healthy - Low Carb  Accuchecks 4 times/day, Once in AM empty stomach and then before each meal. Log in all results and show them to your Prim.MD in 3 days. If any glucose reading is under 80 or above 300 call your Prim MD immidiately. Follow Low glucose instructions for glucose under 80 as instructed.   Special Instructions: If you have smoked or chewed Tobacco  in the last 2 yrs please stop smoking, stop any regular Alcohol  and or any Recreational drug use.  On your next visit with your primary care physician please Get Medicines reviewed and adjusted.  Please request your Prim.MD to go over all Hospital Tests and Procedure/Radiological results at the follow up, please get all Hospital records sent to your Prim MD by signing hospital release before you go home.  If you experience worsening of your admission symptoms, develop shortness of breath, life threatening emergency,  suicidal  or homicidal thoughts you must seek medical attention immediately by calling 911 or calling your MD immediately  if symptoms less severe.  You Must read complete instructions/literature along with all the possible adverse reactions/side effects for all the Medicines you take and that have been prescribed to you. Take any new Medicines after you have completely understood and accpet all the possible adverse reactions/side effects.   Increase activity slowly   Complete by: As directed    MyChart COVID-19 home monitoring program   Complete by: Feb 09, 2020    Is the patient willing to use the [place] for home monitoring?: Yes   Temperature monitoring   Complete by: Feb 09, 2020    After how many days would you like to receive a notification of this patient's flowsheet entries?: 1      Discharge Medications   Allergies as of 02/09/2020   No Known Allergies     Medication List    STOP taking these medications   (feeding supplement) PROSource Plus liquid   feeding supplement (KATE FARMS STANDARD 1.4) Liqd liquid     TAKE these medications   Asacol HD 800 MG Tbec Generic drug: Mesalamine Take 1,600 mg by mouth 2 (two) times daily.   ASCENSIA CONTOUR TEST VI Use as directed to test two times a day   glucose blood test strip Commonly known as: Estate manager/land agent TEST BLOOD SUGAR TWICE DAILY AS DIRECTED.   KROGER TEST STRIPS test strip Generic drug: glucose blood 1 each by Other route 2 times daily.   aspirin 81 MG EC tablet Take 1 tablet (81 mg total) by mouth daily. Swallow whole.   atenolol 25 MG tablet Commonly known as: TENORMIN Take 1 tablet (25 mg total) by mouth 2 (two) times daily. What changed:   how much to take  when to take this   B-D ULTRAFINE III SHORT PEN 31G X 8 MM Misc Generic drug: Insulin Pen Needle USE 1 PEN NEEDLE FOUR TIMES DAILY.   Fifty50 Pen Needles 31G X 8 MM Misc Generic drug: Insulin Pen Needle Inject 31 g into the  skin.   busPIRone 15 MG tablet Commonly known as: BUSPAR Take 15 mg by mouth daily.   calcium-vitamin D 500-200 MG-UNIT tablet Commonly known as: OSCAL WITH D Take 1 tablet by mouth daily.   Daily-Vite/Iron/Beta-Carotene Tabs Take 1 tablet by mouth daily.   insulin lispro 100 UNIT/ML KiwkPen Commonly known as: HumaLOG KwikPen INJECT UNDER THE SKIN THREE TIMES DAILY( 25 UNITS, 25 UNITS AND 20 UNITS RESPECTIVELY BEFORE MEALS) What changed:   how much to take  how to take this  when to take this  additional instructions  Another medication with the same name was removed. Continue taking this medication, and follow the directions you see here.   leuprolide 11.25 MG injection Commonly known as: LUPRON Inject 11.25 mg into the muscle every 6 (six) months.   levothyroxine 125 MCG tablet Commonly known as: SYNTHROID Take 1 tablet (125 mcg total) by mouth daily before breakfast. Start taking on: February 10, 2020 What changed:   medication strength  how much to take        Follow-up Information    Avva, Ravisankar, MD. Schedule an appointment as soon as possible for a visit in 1 week(s).   Specialty: Internal Medicine Contact information: [address] [place] [address] [phone]        Croitoru, Dani Gobble, MD. Schedule an appointment as soon as possible for a  visit in 1 week(s).   Specialty: Cardiology Contact information: [address] [place] [address] [phone]               Major procedures and Radiology Reports - PLEASE review detailed and final reports thoroughly  -      DG CHEST PORT 1 VIEW  Result Date: 02/08/2020 CLINICAL DATA:  COVID-19 EXAM: PORTABLE CHEST 1 VIEW COMPARISON:  01/04/2020 FINDINGS: Right hemidiaphragm remains elevated. Decreased size of small left pleural effusion. Improved aeration at the left lung base. No focal airspace consolidation or pulmonary edema. Normal cardiomediastinal contours. Spinal  rods. IMPRESSION: Decreased size of small left pleural effusion and improved aeration of the left lung base. Electronically Signed   By: Ulyses Jarred M.D.   On: 02/08/2020 00:36    Micro Results    Recent Results (from the past 240 hour(s))  SARS Coronavirus 2 by RT PCR (hospital order, performed in [facility] hospital lab) Nasopharyngeal Nasopharyngeal Swab     Status: Abnormal   Collection Time: 02/07/20  4:37 PM   Specimen: Nasopharyngeal Swab  Result Value Ref Range Status   SARS Coronavirus 2 POSITIVE (A) NEGATIVE Final    Comment: RESULT CALLED TO, READ BACK BY AND VERIFIED WITH: I ABRAHAM 02/07/20 AT 1845 (NOTE) SARS-CoV-2 target nucleic acids are DETECTED  SARS-CoV-2 RNA is generally detectable in upper respiratory specimens  during the acute phase of infection.  Positive results are indicative  of the presence of the identified virus, but do not rule out bacterial infection or co-infection with other pathogens not detected by the test.  Clinical correlation with patient history and  other diagnostic information is necessary to determine patient infection status.  The expected result is negative.  Fact Sheet for Patients:   StrictlyIdeas.no   Fact Sheet for Healthcare Providers:   BankingDealers.co.za    This test is not yet approved or cleared by the [place] FDA and  has been authorized for detection and/or diagnosis of SARS-CoV-2 by FDA under an Emergency Use Authorization (EUA).  This EUA will remain in effect (meaning this test can b e used) for the duration of  the COVID-19 declaration under Section 564(b)(1) of the Act, 21 U.S.C. section 360-bbb-3(b)(1), unless the authorization is terminated or revoked sooner.  Performed at [facility], [place] [address] [place], [place] [address]     Today   Subjective   Devin [name] today has no headache,no chest abdominal pain,no new weakness tingling or  numbness, feels much better wants to go home today.    Objective   Blood pressure (!) 154/85, pulse 85, temperature 98.2 F (36.8 C), temperature source Oral, resp. rate 20, SpO2 93 %.   Intake/Output Summary (Last 24 hours) at 02/09/2020 0922 Last data filed at 02/08/2020 2035 Gross per 24 hour  Intake 830 ml  Output --  Net 830 ml    Exam  Awake Alert, No new F.N deficits, Normal affect Ross.AT,PERRAL Supple Neck,No JVD, No cervical lymphadenopathy appriciated.  Symmetrical Chest wall movement, Good air movement bilaterally, CTAB RRR,No Gallops,Rubs or new Murmurs, No Parasternal Heave +ve B.Sounds, Abd Soft, Non tender, No organomegaly appriciated, No rebound -guarding or rigidity. No Cyanosis, Clubbing or edema, No new Rash or bruise   Data Review   CBC w Diff:  Lab Results  Component Value Date   WBC 9.0 02/09/2020   HGB 13.3 02/09/2020   HGB 12.9 (L) 09/06/2019   HGB 12.6 (L) 02/18/2017   HCT  42.4 02/09/2020   HCT 37.3 (L) 02/18/2017   PLT 316 02/09/2020   PLT 261 09/06/2019   PLT 246 02/18/2017   LYMPHOPCT 17 02/09/2020   LYMPHOPCT 17.0 02/18/2017   MONOPCT 7 02/09/2020   MONOPCT 6.0 02/18/2017   EOSPCT 2 02/09/2020   EOSPCT 2.2 02/18/2017   BASOPCT 1 02/09/2020   BASOPCT 0.3 02/18/2017    CMP:  Lab Results  Component Value Date   NA 140 02/09/2020   NA 144 02/07/2020   NA 140 02/18/2017   K 3.1 (L) 02/09/2020   K 4.5 02/18/2017   CL 93 (L) 02/09/2020   CO2 35 (H) 02/09/2020   CO2 22 02/18/2017   BUN 12 02/09/2020   BUN 23 02/07/2020   BUN 32.1 (H) 02/18/2017   CREATININE 1.21 02/09/2020   CREATININE 1.39 (H) 09/06/2019   CREATININE 1.9 (H) 02/18/2017   PROT 6.4 (L) 02/09/2020   PROT 6.9 02/18/2017   ALBUMIN 3.1 (L) 02/09/2020   ALBUMIN 3.7 02/18/2017   BILITOT 0.5 02/09/2020   BILITOT 0.4 09/06/2019   BILITOT 0.31 02/18/2017   ALKPHOS 86 02/09/2020   ALKPHOS 102 02/18/2017   AST 27 02/09/2020   AST 25 09/06/2019   AST 35 (H) 02/18/2017    ALT 19 02/09/2020   ALT 20 09/06/2019   ALT 35 02/18/2017  .   Total Time in preparing paper work, data evaluation and todays exam - 32 minutes  Lala Lund M.D on 02/09/2020 at [place]  [phone]

## 2020-02-09 NOTE — Care Management (Signed)
Spoke w patient, he declined all [facility] services.

## 2020-02-09 NOTE — Progress Notes (Signed)
[name] to be D/C'd Home per MD order.  Discussed with the patient and all questions fully answered.  VSS, Skin clean, dry and intact without evidence of skin break down, no evidence of skin tears noted. IV catheter discontinued intact. Site without signs and symptoms of complications. Dressing and pressure applied.  An After Visit Summary was printed and given to the patient. Patient received prescription.  D/c education completed with patient including follow up instructions, medication list, d/c activities limitations if indicated, with other d/c instructions as indicated by MD - patient able to verbalize understanding, all questions fully answered.   Patient instructed to return to ED, call 911, or call MD for any changes in condition.   Patient escorted via [place], and D/C home via private auto.  Jeanella Craze 02/09/2020 12:45 PM

## 2020-02-16 ENCOUNTER — Ambulatory Visit: Payer: Medicare Other | Admitting: Internal Medicine

## 2020-02-21 ENCOUNTER — Ambulatory Visit: Payer: Medicare Other | Admitting: Internal Medicine

## 2020-02-21 ENCOUNTER — Other Ambulatory Visit: Payer: Self-pay

## 2020-02-21 ENCOUNTER — Encounter: Payer: Self-pay | Admitting: Internal Medicine

## 2020-02-21 VITALS — BP 144/88 | HR 83 | Ht 69.0 in | Wt 182.2 lb

## 2020-02-21 DIAGNOSIS — R531 Weakness: Secondary | ICD-10-CM

## 2020-02-21 DIAGNOSIS — I10 Essential (primary) hypertension: Secondary | ICD-10-CM | POA: Diagnosis not present

## 2020-02-21 DIAGNOSIS — R42 Dizziness and giddiness: Secondary | ICD-10-CM

## 2020-02-21 NOTE — Patient Instructions (Signed)
Medication Instructions:  No changes *If you need a refill on your cardiac medications before your next appointment, please call your pharmacy*   Lab Work: none If you have labs (blood work) drawn today and your tests are completely normal, you will receive your results only by: Marland Kitchen MyChart Message (if you have MyChart) OR . A paper copy in the mail If you have any lab test that is abnormal or we need to change your treatment, we will call you to review the results.   Testing/Procedures: none   Follow-Up: At [facility], you and your health needs are our priority.  As part of our continuing mission to provide you with exceptional heart care, we have created designated Provider Care Teams.  These Care Teams include your primary Cardiologist (physician) and Advanced Practice Providers (APPs -  Physician Assistants and Nurse Practitioners) who all work together to provide you with the care you need, when you need it.  We recommend signing up for the patient portal called "MyChart".  Sign up information is provided on this After Visit Summary.  MyChart is used to connect with patients for Virtual Visits (Telemedicine).  Patients are able to view lab/test results, encounter notes, upcoming appointments, etc.  Non-urgent messages can be sent to your provider as well.   To learn more about what you can do with MyChart, go to [email or web address].    Your next appointment:   6 month(s)  The format for your next appointment:   In Person  Provider:   You may see Dorris Carnes, MD or one of the following Advanced Practice Providers on your designated Care Team:    Richardson Dopp, PA-C  [place], [place]    Other Instructions  Please record your blood pressure and in about a month provide a list of the readings to Dr. Harrington Challenger.

## 2020-02-22 NOTE — Progress Notes (Signed)
Cardiology Office Note   Date:  02/22/2020   ID:  Kerrigan, [place] March 15, 1941, MRN [id number]  PCP:  Prince Solian, MD  Cardiologist:   Dorris Carnes, MD   Pt presents for f/u of weakness   History of Present Illness: FERAS GARDELLA is a 79 y.o. male with a history of nonsmall cell lung CA, HTN, hypothyroidism, DM Type II.  He developed sOB this summer  Was treated as an outpt with ABX  Went to [facility] and was admitted with SOB and chest pressure   He had CT chest that showed no acute pathology   Trop was mildly increased.   Echo done:  LVEF normal   Lexiscan myovue also done which was normal He was sent home   I saw him on 8/30 /21 for f/u   ON that day he says he was very weak   Indeed, exam remarkable for signif orthostasis with drop in BP to 68/ with standing    I admitted the pt to [facility] for further eval/Rx. ON admit the pt was found to be COVID positive    He was given IV fluids with prompt improvement in his symptoms   Synthroid was also incresaed  Since d/c the pt says he has felt good   HE is not dizzy   Not weak  Denies CP         Current Meds  Medication Sig  . ASACOL HD 800 MG TBEC Take 1,600 mg by mouth 2 (two) times daily.  Marland Kitchen aspirin 81 MG EC tablet Take 1 tablet (81 mg total) by mouth daily. Swallow whole.  Marland Kitchen atenolol (TENORMIN) 25 MG tablet Take 1 tablet (25 mg total) by mouth 2 (two) times daily.  . B-D ULTRAFINE III SHORT PEN 31G X 8 MM MISC USE 1 PEN NEEDLE FOUR TIMES DAILY.  . busPIRone (BUSPAR) 15 MG tablet Take 15 mg by mouth daily.   . calcium-vitamin D (OSCAL WITH D) 500-200 MG-UNIT tablet Take 1 tablet by mouth daily.  . Glucose Blood (ASCENSIA CONTOUR TEST VI) Use as directed to test two times a day   . glucose blood (BAYER CONTOUR TEST) test strip TEST BLOOD SUGAR TWICE DAILY AS DIRECTED.  Marland Kitchen glucose blood (KROGER TEST STRIPS) test strip 1 each by Other route 2 times daily.  . insulin lispro (HUMALOG KWIKPEN) 100 UNIT/ML KiwkPen INJECT  UNDER THE SKIN THREE TIMES DAILY( 25 UNITS, 25 UNITS AND 20 UNITS RESPECTIVELY BEFORE MEALS) (Patient taking differently: Inject 20-30 Units into the skin See admin instructions. INJECT UNDER THE SKIN THREE TIMES DAILY( 25 units in the morning, 20 units in the afternoon, 30 units in the evening per patient.)  . Insulin Pen Needle (FIFTY50 PEN NEEDLES) 31G X 8 MM MISC Inject 31 g into the skin.  Marland Kitchen leuprolide (LUPRON) 11.25 MG injection Inject 11.25 mg into the muscle every 6 (six) months.   . levothyroxine (SYNTHROID) 125 MCG tablet Take 1 tablet (125 mcg total) by mouth daily before breakfast.  . Multiple Vitamins-Iron (DAILY-VITE/IRON/BETA-CAROTENE) TABS Take 1 tablet by mouth daily.      Allergies:   Patient has no known allergies.   Past Medical History:  Diagnosis Date  . Adenocarcinoma of left lung, stage 1 ([place]) 03/10/2015  . Ankylosing spondylitis ([place])    Diagnosed during lumbar fracture summer of 2016    . Crohn's disease ([place])   . Gout   . HIstory of basal cell cancer of face  THYROID CA HX  . Hypertension   . Hypocalcemia 04/13/2007  . Hypothyroidism   . Impotence   . 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 Obesity (BMI 30-39.9)   . Osteoporosis    Pt completed 5 years of fosamax in 2013     . Prostate cancer with recurrence    Treated with prostatectomy with recurrence 2012 with Lupron treatment now him   . Psoriasis   . Thyroid cancer ([place]) 1999   Treated with RAI and total thyroidectomy   . Type II diabetes mellitus ([place])     Past Surgical History:  Procedure Laterality Date  . ABDOMINAL EXPLORATION SURGERY     for small bowel obstruction  . APPENDECTOMY  03/2007  . BACK SURGERY    . BASAL CELL CARCINOMA EXCISION  "several"   "head"  . CARDIAC CATHETERIZATION  03/17/2003  . CHOLECYSTECTOMY N/A 05/07/2016   Procedure: LAPAROSCOPIC CHOLECYSTECTOMY;  Surgeon: Fanny Skates, MD;  Location: [facility];  Service: General;  Laterality: N/A;  .  COLON SURGERY  03/2007   Resection of cecum, appendix, terminal ileum (approximately/notes 10/10/2010  . HERNIA REPAIR    . LAPAROSCOPIC CHOLECYSTECTOMY  05/07/2016  . LAPAROSCOPIC LYSIS OF ADHESIONS  05/07/2016  . LAPAROSCOPIC LYSIS OF ADHESIONS N/A 05/07/2016   Procedure: LAPAROSCOPIC LYSIS OF ADHESIONS TIMES ONE HOUR;  Surgeon: Fanny Skates, MD;  Location: [facility];  Service: General;  Laterality: N/A;  . POSTERIOR FUSION THORACIC SPINE  02/08/2016   1. Posterior thoracic arthrodesis T7-T11 utilizing morcellized allograft, 2. Posterior thoracic segmental fixation T7-T11 utilizing nuvasive pedicle screws  . PROSTATECTOMY  06/2001   w/bilateral pelvic lymph nose dissection/notes 10/24/2010  . SPINAL FUSION  12/2014   Open reduction internal fixation of L1 Chance fracture with posterior fusion T10-L4 utilizing morcellized allograft and some local autograft, segmental instrumentation T10-L4 inclusive utilizing nuvasive pedicle screws/notes 12/16/2014  . Stress Cardiolite  02/17/2003  . THOROCOTOMY WITH LOBECTOMY  03/16/2015   Procedure: THOROCOTOMY WITH LOBECTOMY;  Surgeon: Ivin Poot, MD;  Location: [facility];  Service: Thoracic;;  . TONSILLECTOMY    . TOTAL THYROIDECTOMY  1997  . Venous Doppler  05/30/2004  . VENTRAL HERNIA REPAIR  04/14/2008  . VIDEO ASSISTED THORACOSCOPY Left 03/16/2015   Procedure: VIDEO ASSISTED THORACOSCOPY;  Surgeon: Ivin Poot, MD;  Location: [facility];  Service: Thoracic;  Laterality: Left;     Social History:  The patient  reports that he quit smoking about 30 years ago. His smoking use included cigarettes. He has a 60.00 pack-year smoking history. He has never used smokeless tobacco. He reports current alcohol use. He reports that he does not use drugs.   Family History:  The patient's family history includes CAD in his mother.    ROS:  Please see the history of present illness. All other systems are reviewed and  Negative to the above problem except as noted.     PHYSICAL EXAM: VS:  BP (!) 144/88   Pulse 83   Ht 5' 9"  (1.753 m)   Wt 182 lb 3.2 oz (82.6 kg)   SpO2 91%   BMI 26.91 kg/m   Orthostatics:  Laying 152/84  P 77   Sitting 140/80 P 77   Standing  132/68  P 81  Standing 4 min 134/70 P 81   GEN: Well nourished, well developed, in no acute distress  HEENT: normal  Neck: no JVD, carotid bruits, or masses Cardiac: RRR; no murmurs, rubs, or gallops,no edema  Respiratory:  Decreased airflow on L  GI: soft, nontender, nondistended, + BS  No hepatomegaly  MS: no deformity Moving all extremities   Skin: warm and dry, no rash Neuro:  Strength and sensation are intact Psych: euthymic mood, full affect   EKG:  EKG is not ordered today.   Lipid Panel    Component Value Date/Time   CHOL 89 01/05/2020 0217   TRIG 63 01/05/2020 0217   TRIG 151 (H) 03/18/2006 0835   HDL 50 01/05/2020 0217   CHOLHDL 1.8 01/05/2020 0217   VLDL 13 01/05/2020 0217   LDLCALC 26 01/05/2020 0217   LDLDIRECT 51.0 05/08/2015 0904      Wt Readings from Last 3 Encounters:  02/21/20 182 lb 3.2 oz (82.6 kg)  02/07/20 183 lb 6.4 oz (83.2 kg)  01/24/20 177 lb 8 oz (80.5 kg)      ASSESSMENT AND PLAN:  1  Orthostasis   REsolved  Actually hypertensive   I would keep on same regimen  Stay hydrated     2  Hx HTN  Continue to follow as outpt  3  Hx NSTEMI  Probably demand in setting of other medical problems   WIll continue to follow     F/U in spring for BP    Current medicines are reviewed at length with the patient today.  The patient does not have concerns regarding medicines.  Signed, Dorris Carnes, MD  02/22/2020 9:41 PM    [address], [place], [place]  [address] Phone: [phone]; Fax: [phone]

## 2020-03-26 ENCOUNTER — Other Ambulatory Visit: Payer: Self-pay

## 2020-03-26 ENCOUNTER — Emergency Department (HOSPITAL_COMMUNITY): Payer: Medicare Other

## 2020-03-26 ENCOUNTER — Emergency Department (HOSPITAL_COMMUNITY)
Admission: EM | Admit: 2020-03-26 | Discharge: 2020-03-26 | Discharge status: Against Medical Advice | Payer: Medicare Other | Attending: Emergency Medicine | Admitting: Emergency Medicine

## 2020-03-26 DIAGNOSIS — I480 Paroxysmal atrial fibrillation: Secondary | ICD-10-CM | POA: Insufficient documentation

## 2020-03-26 DIAGNOSIS — Z85850 Personal history of malignant neoplasm of thyroid: Secondary | ICD-10-CM | POA: Diagnosis not present

## 2020-03-26 DIAGNOSIS — Z87891 Personal history of nicotine dependence: Secondary | ICD-10-CM | POA: Insufficient documentation

## 2020-03-26 DIAGNOSIS — E1129 Type 2 diabetes mellitus with other diabetic kidney complication: Secondary | ICD-10-CM | POA: Insufficient documentation

## 2020-03-26 DIAGNOSIS — Z8546 Personal history of malignant neoplasm of prostate: Secondary | ICD-10-CM | POA: Diagnosis not present

## 2020-03-26 DIAGNOSIS — E039 Hypothyroidism, unspecified: Secondary | ICD-10-CM | POA: Diagnosis not present

## 2020-03-26 DIAGNOSIS — R42 Dizziness and giddiness: Secondary | ICD-10-CM | POA: Diagnosis present

## 2020-03-26 DIAGNOSIS — Z79899 Other long term (current) drug therapy: Secondary | ICD-10-CM | POA: Diagnosis not present

## 2020-03-26 DIAGNOSIS — Z794 Long term (current) use of insulin: Secondary | ICD-10-CM | POA: Insufficient documentation

## 2020-03-26 DIAGNOSIS — I10 Essential (primary) hypertension: Secondary | ICD-10-CM | POA: Diagnosis not present

## 2020-03-26 DIAGNOSIS — Z7982 Long term (current) use of aspirin: Secondary | ICD-10-CM | POA: Insufficient documentation

## 2020-03-26 LAB — CBC WITH DIFFERENTIAL/PLATELET
Abs Immature Granulocytes: 0.02 10*3/uL (ref 0.00–0.07)
Basophils Absolute: 0 10*3/uL (ref 0.0–0.1)
Basophils Relative: 0 %
Eosinophils Absolute: 0.1 10*3/uL (ref 0.0–0.5)
Eosinophils Relative: 1 %
HCT: 38.2 % — ABNORMAL LOW (ref 39.0–52.0)
Hemoglobin: 11.5 g/dL — ABNORMAL LOW (ref 13.0–17.0)
Immature Granulocytes: 0 %
Lymphocytes Relative: 9 %
Lymphs Abs: 0.7 10*3/uL (ref 0.7–4.0)
MCH: 28.8 pg (ref 26.0–34.0)
MCHC: 30.1 g/dL (ref 30.0–36.0)
MCV: 95.7 fL (ref 80.0–100.0)
Monocytes Absolute: 0.4 10*3/uL (ref 0.1–1.0)
Monocytes Relative: 6 %
Neutro Abs: 5.8 10*3/uL (ref 1.7–7.7)
Neutrophils Relative %: 84 %
Platelets: 216 10*3/uL (ref 150–400)
RBC: 3.99 MIL/uL — ABNORMAL LOW (ref 4.22–5.81)
RDW: 15.7 % — ABNORMAL HIGH (ref 11.5–15.5)
WBC: 7 10*3/uL (ref 4.0–10.5)
nRBC: 0 % (ref 0.0–0.2)

## 2020-03-26 LAB — COMPREHENSIVE METABOLIC PANEL
ALT: 20 U/L (ref 0–44)
AST: 24 U/L (ref 15–41)
Albumin: 3 g/dL — ABNORMAL LOW (ref 3.5–5.0)
Alkaline Phosphatase: 69 U/L (ref 38–126)
Anion gap: 10 (ref 5–15)
BUN: 20 mg/dL (ref 8–23)
CO2: 31 mmol/L (ref 22–32)
Calcium: 7.9 mg/dL — ABNORMAL LOW (ref 8.9–10.3)
Chloride: 100 mmol/L (ref 98–111)
Creatinine, Ser: 1.33 mg/dL — ABNORMAL HIGH (ref 0.61–1.24)
GFR, Estimated: 51 mL/min — ABNORMAL LOW (ref >60)
Glucose, Bld: 143 mg/dL — ABNORMAL HIGH (ref 70–99)
Potassium: 3.2 mmol/L — ABNORMAL LOW (ref 3.5–5.1)
Sodium: 141 mmol/L (ref 135–145)
Total Bilirubin: 0.8 mg/dL (ref 0.3–1.2)
Total Protein: 5.3 g/dL — ABNORMAL LOW (ref 6.5–8.1)

## 2020-03-26 LAB — T4, FREE: Free T4: 0.92 ng/dL (ref 0.61–1.12)

## 2020-03-26 LAB — MAGNESIUM: Magnesium: 1.4 mg/dL — ABNORMAL LOW (ref 1.7–2.4)

## 2020-03-26 LAB — TSH: TSH: 9.119 u[IU]/mL — ABNORMAL HIGH (ref 0.350–4.500)

## 2020-03-26 MED ORDER — APIXABAN 5 MG PO TABS: 5.0000 mg | ORAL_TABLET | Freq: Two times a day (BID) | ORAL | 0 refills | Status: DC

## 2020-03-26 MED ORDER — MAGNESIUM OXIDE 400 (241.3 MG) MG PO TABS
400.0000 mg | ORAL_TABLET | Freq: Once | ORAL | Status: AC
  Administered 2020-03-26: 400 mg via ORAL
  Filled 2020-03-26: qty 1

## 2020-03-26 MED ORDER — CALCIUM CARBONATE ANTACID 500 MG PO CHEW
400.0000 mg | CHEWABLE_TABLET | Freq: Once | ORAL | Status: AC
  Administered 2020-03-26: 1000 mg via ORAL
  Filled 2020-03-26: qty 2

## 2020-03-26 NOTE — ED Triage Notes (Signed)
Pt arrives by EMS from home with complaints of dizzy and chest discomfort. Upon EMS arrival pt was found in Afib with a rate of 150-160. Pressure 80/30.  Alert and oriented X4. No syncopal episode noticed.   1 liter NS given 29m Cardizem  20 lAC  120/90 HR 80-100 RR 18 98% RA 123 CBG

## 2020-03-26 NOTE — Discharge Instructions (Signed)
You need to continue taking your Calcium supplement but also would be good to get a multivitamin that has magnesium.  Your thyroid test are looking better and labs showed lower calcium and magnesium levels.  You also will need to start a blood thinner to prevent stroke but will need to hold starting it until after your colonoscopy on tues.  You should stop taking aspirin when you start the Eliquis.  You start feeling the same way again return to the ER.

## 2020-03-26 NOTE — ED Provider Notes (Signed)
[facility] EMERGENCY DEPARTMENT Provider Note   CSN: [id number] Arrival date & time: 03/26/20  8185     History Chief Complaint  Patient presents with  . Dizziness    Devin [name] is a 79 y.o. male.  Patient is a 79 year old male with a history of Crohn's disease, hypothyroidism on Synthroid, hypocalcemia, hypomagnesemia, diabetes, recent hospitalization for NSTEMI with normal EF and lexicon Myoview and hypertension who is presenting today with complaint of dizziness.  Patient reports when he woke up this morning he felt fine but shortly after getting up he started not feeling well.  He reports he felt generally sluggish, lightheaded like he might pass out and was seeing spots in his vision.  He and his wife checked his blood pressure and it was low and he reported he checked his blood sugar and it was normal.  He reports he has had some mild chest discomfort during this event which is now resolved.  He denied any significant shortness of breath.  Things started feeling worse and they called 911.  When EMS arrived patient was found to have a blood pressure of 80/40 with heart rates around 160 with atrial fibrillation.  He received 1 L of fluid and 10 mg of Cardizem and now on my arrival patient reports he feels his normal self.  He has no chest pain, shortness of breath or fatigue.  He reports nothing like this is ever happened before.  He reports yesterday was a normal day and he was feeling his normal self.  He has not had recent illness and denies any recent medication changes.  During hospitalization over a month ago patient was found to have low calcium and magnesium and also had abnormal thyroid test and his Synthroid was increased.  The history is provided by the patient.  Dizziness Quality:  Lightheadedness Severity:  Severe Onset quality:  Sudden Timing:  Constant Progression:  Resolved Chronicity:  New Associated symptoms: chest pain, vision changes and  weakness   Associated symptoms: no headaches, no palpitations and no shortness of breath        Past Medical History:  Diagnosis Date  . Adenocarcinoma of left lung, stage 1 ([place]) 03/10/2015  . Ankylosing spondylitis ([place])    Diagnosed during lumbar fracture summer of 2016    . Crohn's disease ([place])   . Gout   . HIstory of basal cell cancer of face    THYROID CA HX  . Hypertension   . Hypocalcemia 04/13/2007  . Hypothyroidism   . Impotence   . 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 Obesity (BMI 30-39.9)   . Osteoporosis    Pt completed 5 years of fosamax in 2013     . Prostate cancer with recurrence    Treated with prostatectomy with recurrence 2012 with Lupron treatment now him   . Psoriasis   . Thyroid cancer ([place]) 1999   Treated with RAI and total thyroidectomy   . Type II diabetes mellitus [facility])     Patient Active Problem List   Diagnosis Date Noted  . Orthostatic hypotension 02/07/2020  . Dyspnea 01/17/2020  . NSTEMI (non-ST elevated myocardial infarction) ([place]) 01/04/2020  . Crohn's ileocolitis ([place]) 02/21/2017  . Hyperlipidemia 02/21/2017  . Hyperuricemia 02/21/2017  . 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 Thyroid cancer ([address]) 02/21/2017  . Cholecystitis with cholelithiasis 05/07/2016  . Cholecystitis 03/14/2016  . Abdominal pain 03/05/2016  . Malnutrition of moderate degree 02/10/2016  . Thoracic compression fracture (  [place]) 02/08/2016  . Back pain 01/26/2016  . Hip pain 01/26/2016  . S/P lobectomy of lung 03/16/2015  . Adenocarcinoma of left lung, stage 1 ([place]) 03/10/2015  . Diabetic neuropathy ([place])   . Ankylosing spondylitis ([place])   . Obesity (BMI 30-39.9)   . Crohn's disease ([place])   . Lung nodule 02/23/2015  . HIstory of basal cell cancer of face   . History of thyroid cancer   . Postsurgical hypothyroidism   . Gout 12/29/2006  . Essential hypertension 12/29/2006  . Osteoporosis 12/29/2006  . DM (diabetes mellitus), type 2  with renal complications ([place])   . Prostate cancer with recurrence     Past Surgical History:  Procedure Laterality Date  . ABDOMINAL EXPLORATION SURGERY     for small bowel obstruction  . APPENDECTOMY  03/2007  . BACK SURGERY    . BASAL CELL CARCINOMA EXCISION  "several"   "head"  . CARDIAC CATHETERIZATION  03/17/2003  . CHOLECYSTECTOMY N/A 05/07/2016   Procedure: LAPAROSCOPIC CHOLECYSTECTOMY;  Surgeon: Fanny Skates, MD;  Location: [facility];  Service: General;  Laterality: N/A;  . COLON SURGERY  03/2007   Resection of cecum, appendix, terminal ileum (approximately/notes 10/10/2010  . HERNIA REPAIR    . LAPAROSCOPIC CHOLECYSTECTOMY  05/07/2016  . LAPAROSCOPIC LYSIS OF ADHESIONS  05/07/2016  . LAPAROSCOPIC LYSIS OF ADHESIONS N/A 05/07/2016   Procedure: LAPAROSCOPIC LYSIS OF ADHESIONS TIMES ONE HOUR;  Surgeon: Fanny Skates, MD;  Location: [facility];  Service: General;  Laterality: N/A;  . POSTERIOR FUSION THORACIC SPINE  02/08/2016   1. Posterior thoracic arthrodesis T7-T11 utilizing morcellized allograft, 2. Posterior thoracic segmental fixation T7-T11 utilizing nuvasive pedicle screws  . PROSTATECTOMY  06/2001   w/bilateral pelvic lymph nose dissection/notes 10/24/2010  . SPINAL FUSION  12/2014   Open reduction internal fixation of L1 [place] fracture with posterior fusion T10-L4 utilizing morcellized allograft and some local autograft, segmental instrumentation T10-L4 inclusive utilizing nuvasive pedicle screws/notes 12/16/2014  . Stress Cardiolite  02/17/2003  . THOROCOTOMY WITH LOBECTOMY  03/16/2015   Procedure: THOROCOTOMY WITH LOBECTOMY;  Surgeon: Ivin Poot, MD;  Location: [facility];  Service: Thoracic;;  . TONSILLECTOMY    . TOTAL THYROIDECTOMY  1997  . Venous Doppler  05/30/2004  . VENTRAL HERNIA REPAIR  04/14/2008  . VIDEO ASSISTED THORACOSCOPY Left 03/16/2015   Procedure: VIDEO ASSISTED THORACOSCOPY;  Surgeon: Ivin Poot, MD;  Location[facility] OR;  Service: Thoracic;  Laterality:  Left;       Family History  Problem Relation Age of Onset  . CAD Mother   . Cancer Neg Hx        No cancer in the patient's immediate family, except of course for the patient himself, as noted    Social History   Tobacco Use  . Smoking status: Former Smoker    Packs/day: 2.00    Years: 30.00    Pack years: 60.00    Types: Cigarettes    Quit date: 12/26/1989    Years since quitting: 30.2  . Smokeless tobacco: Never Used  Vaping Use  . Vaping Use: Never used  Substance Use Topics  . Alcohol use: Yes    Alcohol/week: 0.0 standard drinks    Comment: 02/08/2016 "might have a couple drinks/year; might not"  . Drug use: No    Home Medications Prior to Admission medications   Medication Sig Start Date End Date Taking? Authorizing Provider  ASACOL HD 800 MG TBEC Take 1,600 mg by mouth 2 (two) times daily.  11/14/14   [provider]  aspirin 81 MG EC tablet Take 1 tablet (81 mg total) by mouth daily. Swallow whole. 01/06/20   Swayze, Ava, DO  atenolol (TENORMIN) 25 MG tablet Take 1 tablet (25 mg total) by mouth 2 (two) times daily. 02/09/20   Thurnell Lose, MD  B-D ULTRAFINE III SHORT PEN 31G X 8 MM MISC USE 1 PEN NEEDLE FOUR TIMES DAILY. 09/18/16   Ivar Drape D, PA  busPIRone (BUSPAR) 15 MG tablet Take 15 mg by mouth daily.  05/17/16   [provider]  calcium-vitamin D (OSCAL WITH D) 500-200 MG-UNIT tablet Take 1 tablet by mouth daily.    [provider]  Glucose Blood (ASCENSIA CONTOUR TEST VI) Use as directed to test two times a day     [provider]  glucose blood (BAYER CONTOUR TEST) test strip TEST BLOOD SUGAR TWICE DAILY AS DIRECTED. 05/17/16   Gale Journey, Sarah L, PA-C  glucose blood ([employer] TEST STRIPS) test strip 1 each by Other route 2 times daily. 05/17/16   [provider]  insulin lispro (HUMALOG KWIKPEN) 100 UNIT/ML KiwkPen INJECT UNDER THE SKIN THREE TIMES DAILY( 25 UNITS, 25 UNITS AND 20 UNITS RESPECTIVELY BEFORE  MEALS) Patient taking differently: Inject 20-30 Units into the skin See admin instructions. INJECT UNDER THE SKIN THREE TIMES DAILY( 25 units in the morning, 20 units in the afternoon, 30 units in the evening per patient. 05/17/16   Weber, Damaris Hippo, PA-C  Insulin Pen Needle (FIFTY50 PEN NEEDLES) 31G X 8 MM MISC Inject 31 g into the skin. 09/18/16   [provider]  leuprolide (LUPRON) 11.25 MG injection Inject 11.25 mg into the muscle every 6 (six) months.     [provider]  levothyroxine (SYNTHROID) 125 MCG tablet Take 1 tablet (125 mcg total) by mouth daily before breakfast. 02/10/20   Thurnell Lose, MD  Multiple Vitamins-Iron (DAILY-VITE/IRON/BETA-CAROTENE) TABS Take 1 tablet by mouth daily.     [provider]    Allergies    Patient has no known allergies.  Review of Systems   Review of Systems  Respiratory: Negative for shortness of breath.   Cardiovascular: Positive for chest pain. Negative for palpitations.  Neurological: Positive for dizziness and weakness. Negative for headaches.  All other systems reviewed and are negative.   Physical Exam Updated Vital Signs BP 139/66   Pulse 77   Temp 97.9 F (36.6 C) (Oral)   Resp (!) 28   Ht 5' 11"  (1.803 m)   Wt 84.8 kg   SpO2 94%   BMI 26.08 kg/m   Physical Exam Vitals and nursing note reviewed.  Constitutional:      General: He is not in acute distress.    Appearance: Normal appearance. He is well-developed and normal weight.  HENT:     Head: Normocephalic and atraumatic.  Eyes:     Conjunctiva/sclera: Conjunctivae normal.     Pupils: Pupils are equal, round, and reactive to light.  Cardiovascular:     Rate and Rhythm: Normal rate and regular rhythm.     Pulses: Normal pulses.     Heart sounds: No murmur heard.   Pulmonary:     Effort: Pulmonary effort is normal. No respiratory distress.     Breath sounds: Normal breath sounds. No wheezing or rales.  Abdominal:     General: There is no  distension.     Palpations: Abdomen is soft.     Tenderness: There is no  abdominal tenderness. There is no guarding or rebound.  Musculoskeletal:        General: No tenderness. Normal range of motion.     Cervical back: Normal range of motion and neck supple.     Right lower leg: No edema.     Left lower leg: No edema.  Skin:    General: Skin is warm and dry.     Findings: No erythema or rash.  Neurological:     General: No focal deficit present.     Mental Status: He is alert and oriented to person, place, and time. Mental status is at baseline.  Psychiatric:        Mood and Affect: Mood normal.        Behavior: Behavior normal.        Thought Content: Thought content normal.     ED Results / Procedures / Treatments   Labs (all labs ordered are listed, but only abnormal results are displayed) Labs Reviewed  CBC WITH DIFFERENTIAL/PLATELET - Abnormal; Notable for the following components:      Result Value   RBC 3.99 (*)    Hemoglobin 11.5 (*)    HCT 38.2 (*)    RDW 15.7 (*)    All other components within normal limits  COMPREHENSIVE METABOLIC PANEL - Abnormal; Notable for the following components:   Potassium 3.2 (*)    Glucose, Bld 143 (*)    Creatinine, Ser 1.33 (*)    Calcium 7.9 (*)    Total Protein 5.3 (*)    Albumin 3.0 (*)    GFR, Estimated 51 (*)    All other components within normal limits  MAGNESIUM - Abnormal; Notable for the following components:   Magnesium 1.4 (*)    All other components within normal limits  TSH - Abnormal; Notable for the following components:   TSH 9.119 (*)    All other components within normal limits  T4, FREE  T3    EKG EKG Interpretation  Date/Time:  Sunday March 26 2020 08:33:21 EDT Ventricular Rate:  81 PR Interval:    QRS Duration: 96 QT Interval:  406 QTC Calculation: 472 R Axis:   -33 Text Interpretation: Sinus rhythm Left axis deviation Low voltage, precordial leads RSR' in V1 or V2, right VCD or RVH ST depr,  consider ischemia, inferior leads Borderline ST elevation, lateral leads Artifact Confirmed by Blanchie Dessert [phone]) on 03/26/2020 8:57:10 AM   Radiology DG Chest Port 1 View  Result Date: 03/26/2020 CLINICAL DATA:  Dizziness EXAM: PORTABLE CHEST 1 VIEW COMPARISON:  02/08/2020 chest radiograph and prior. 01/04/2020 chest CT. FINDINGS: Left predominant bibasilar streaky opacities. Trace left pleural effusion, unchanged. No pneumothorax. Cardiomediastinal silhouette within normal limits. Posterior spinal fixation hardware. Midline upper chest and left basilar surgical clips. IMPRESSION: Bibasilar opacities, likely atelectasis. Trace left pleural effusion, unchanged. Electronically Signed   By: Primitivo Gauze M.D.   On: 03/26/2020 11:47    Procedures Procedures (including critical care time)  Medications Ordered in ED Medications - No data to display  ED Course  I have reviewed the triage vital signs and the nursing notes.  Pertinent labs & imaging results that were available during my care of the patient were reviewed by me and considered in my medical decision making (see chart for details).    MDM Rules/Calculators/A&P                          Patient presenting today with  a complaint of dizziness and rapid heart rate.  Patient appeared to be in atrial fibrillation based on EKG tracings prior to arrival.  He received 10 mg of Cardizem and 1 L bolus of fluid which converted him to a sinus rhythm.  Here patient is in sinus rhythm with heart rate between 70 and 80.  He is asymptomatic at this time.  He is generally well appearing.  Overall patient's EKG is in sinus rhythm but has significant artifact.  Patient's CBC with mild drop in hemoglobin to 11.5 from baseline of 13 with a normal white count and platelet count.  CMP with recurrent hypocalcemia of 7.9, baseline creatinine and hypomagnesemia of 1.4 and pt given oral replacement here.  Chest x-ray and thyroid studies are pending.   Chadvasc of 5 and pt will need anticoagulation.  No prior bleeding hx.  11:56 AM TSH is improving and CXR unchanged.  Pt already take ca at home and encouraged to continue that as well as getting a multivitamin that has magnesium.  Will get f/u with afib clinic and will give ppx for eliquis however pt has colonoscopy on tues and recommended holding eliquis until then.  MDM Number of Diagnoses or Management Options   Amount and/or Complexity of Data Reviewed Clinical lab tests: ordered and reviewed Tests in the radiology section of CPT: ordered and reviewed Tests in the medicine section of CPT: ordered and reviewed Decide to obtain previous medical records or to obtain history from someone other than the patient: yes Obtain history from someone other than the patient: yes Review and summarize past medical records: yes Discuss the patient with other providers: yes Independent visualization of images, tracings, or specimens: yes  Risk of Complications, Morbidity, and/or Mortality Presenting problems: high Diagnostic procedures: low Management options: low  Patient Progress Patient progress: improved    Final Clinical Impression(s) / ED Diagnoses Final diagnoses:  PAF (paroxysmal atrial fibrillation) ([place])    Rx / DC Orders ED Discharge Orders         Ordered    Amb referral to AFIB Clinic        03/26/20 1158    apixaban (ELIQUIS) 5 MG TABS tablet  2 times daily        03/26/20 1200           Blanchie Dessert, MD 03/26/20 1203

## 2020-03-27 LAB — T3: T3, Total: 62 ng/dL — ABNORMAL LOW (ref 71–180)

## 2020-03-28 ENCOUNTER — Telehealth: Payer: Self-pay | Admitting: Internal Medicine

## 2020-03-28 NOTE — Telephone Encounter (Signed)
I called pt to review   He had just been to ED with dizziness Found that he was in afib with RVR  Given Diltiazem Sent home on Eliquis   Rmains on Atenolol  Keep on current regimen  Thyroid function tests were drawn  Very mildly abnormal   I have forwarded to R Avva who he sees for IM  Pt has appt in afib clinic   I have stressed importance of continuing Eliquis for stroke prevention.

## 2020-03-30 ENCOUNTER — Ambulatory Visit (HOSPITAL_COMMUNITY)
Admission: RE | Admit: 2020-03-30 | Discharge: 2020-03-30 | Disposition: A | Discharge status: Home / Self Care | Payer: Medicare Other | Source: Ambulatory Visit | Attending: Physician Assistant | Admitting: Physician Assistant

## 2020-03-30 ENCOUNTER — Other Ambulatory Visit: Payer: Self-pay

## 2020-03-30 VITALS — BP 138/68 | HR 77 | Ht 71.0 in | Wt 183.6 lb

## 2020-03-30 DIAGNOSIS — M109 Gout, unspecified: Secondary | ICD-10-CM | POA: Insufficient documentation

## 2020-03-30 DIAGNOSIS — E039 Hypothyroidism, unspecified: Secondary | ICD-10-CM | POA: Diagnosis not present

## 2020-03-30 DIAGNOSIS — K5090 Crohn's disease, unspecified, without complications: Secondary | ICD-10-CM | POA: Insufficient documentation

## 2020-03-30 DIAGNOSIS — I10 Essential (primary) hypertension: Secondary | ICD-10-CM | POA: Diagnosis not present

## 2020-03-30 DIAGNOSIS — Z87891 Personal history of nicotine dependence: Secondary | ICD-10-CM | POA: Diagnosis not present

## 2020-03-30 DIAGNOSIS — E118 Type 2 diabetes mellitus with unspecified complications: Secondary | ICD-10-CM | POA: Insufficient documentation

## 2020-03-30 DIAGNOSIS — E669 Obesity, unspecified: Secondary | ICD-10-CM | POA: Insufficient documentation

## 2020-03-30 DIAGNOSIS — Z6830 Body mass index (BMI) 30.0-30.9, adult: Secondary | ICD-10-CM | POA: Diagnosis not present

## 2020-03-30 DIAGNOSIS — D6869 Other thrombophilia: Secondary | ICD-10-CM | POA: Insufficient documentation

## 2020-03-30 DIAGNOSIS — M810 Age-related osteoporosis without current pathological fracture: Secondary | ICD-10-CM | POA: Diagnosis not present

## 2020-03-30 DIAGNOSIS — Z79899 Other long term (current) drug therapy: Secondary | ICD-10-CM | POA: Insufficient documentation

## 2020-03-30 DIAGNOSIS — Z794 Long term (current) use of insulin: Secondary | ICD-10-CM | POA: Insufficient documentation

## 2020-03-30 DIAGNOSIS — I480 Paroxysmal atrial fibrillation: Secondary | ICD-10-CM | POA: Diagnosis present

## 2020-03-30 MED ORDER — DILTIAZEM HCL 30 MG PO TABS: ORAL_TABLET | ORAL | 1 refills | Status: DC

## 2020-03-30 NOTE — Patient Instructions (Signed)
Cardizem 58m -- take 1 tablet every 4 hours AS NEEDED for heart rate >100 as long as top number of blood pressure >100.

## 2020-03-30 NOTE — Progress Notes (Signed)
Primary Care Physician: Prince Solian, MD Primary Cardiologist: Dr Harrington Challenger Primary Electrophysiologist: none Referring Physician: Dr [name] is a 79 y.o. male with a history of nonsmall cell lung CA, HTN, hypothyroidism, Chron disease, DM, and paroxysmal atrial fibrillation who presents for consultation in the [facility]. The patient was initially diagnosed with atrial fibrillation 03/26/20 after presenting with symptoms of fatigue and presyncope. EMS was called and he was hypotensive and in afib with RVR. He was given a bolus of fluid and IV diltiazem and converted to SR en route to the ED. Patient was started on Eliquis for a CHADS2VASC score of 4. There were no specific triggers he could identify. He has felt well since leaving the ED. He has not yet started Eliquis.   Today, he denies symptoms of palpitations, chest pain, shortness of breath, orthopnea, PND, lower extremity edema, dizziness, presyncope, syncope, snoring, daytime somnolence, bleeding, or neurologic sequela. The patient is tolerating medications without difficulties and is otherwise without complaint today.    Atrial Fibrillation Risk Factors:  he does not have symptoms or diagnosis of sleep apnea. he does not have a history of rheumatic fever. he does not have a history of alcohol use. The patient does not have a history of early familial atrial fibrillation or other arrhythmias.  he has a BMI of Body mass index is 25.61 kg/m.Marland Kitchen Filed Weights   03/30/20 1357  Weight: 83.3 kg    Family History  Problem Relation Age of Onset  . CAD Mother   . Cancer Neg Hx        No cancer in the patient's immediate family, except of course for the patient himself, as noted     Atrial Fibrillation Management history:  Previous antiarrhythmic drugs: none Previous cardioversions: none Previous ablations: none CHADS2VASC score: 4 Anticoagulation history: Eliquis   Past Medical  History:  Diagnosis Date  . Adenocarcinoma of left lung, stage 1 ([place]) 03/10/2015  . Ankylosing spondylitis ([place])    Diagnosed during lumbar fracture summer of 2016    . Crohn's disease ([place])   . Gout   . HIstory of basal cell cancer of face    THYROID CA HX  . Hypertension   . Hypocalcemia 04/13/2007  . Hypothyroidism   . Impotence   . 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 Obesity (BMI 30-39.9)   . Osteoporosis    Pt completed 5 years of fosamax in 2013     . Prostate cancer with recurrence    Treated with prostatectomy with recurrence 2012 with Lupron treatment now him   . Psoriasis   . Thyroid cancer ([place]) 1999   Treated with RAI and total thyroidectomy   . Type II diabetes mellitus ([place])    Past Surgical History:  Procedure Laterality Date  . ABDOMINAL EXPLORATION SURGERY     for small bowel obstruction  . APPENDECTOMY  03/2007  . BACK SURGERY    . BASAL CELL CARCINOMA EXCISION  "several"   "head"  . CARDIAC CATHETERIZATION  03/17/2003  . CHOLECYSTECTOMY N/A 05/07/2016   Procedure: LAPAROSCOPIC CHOLECYSTECTOMY;  Surgeon: Fanny Skates, MD;  Location: [place];  Service: General;  Laterality: N/A;  . COLON SURGERY  03/2007   Resection of cecum, appendix, terminal ileum (approximately/notes 10/10/2010  . HERNIA REPAIR    . LAPAROSCOPIC CHOLECYSTECTOMY  05/07/2016  . LAPAROSCOPIC LYSIS OF ADHESIONS  05/07/2016  . LAPAROSCOPIC LYSIS OF ADHESIONS N/A 05/07/2016  Procedure: LAPAROSCOPIC LYSIS OF ADHESIONS TIMES ONE HOUR;  Surgeon: Fanny Skates, MD;  Location: [place];  Service: General;  Laterality: N/A;  . POSTERIOR FUSION THORACIC SPINE  02/08/2016   1. Posterior thoracic arthrodesis T7-T11 utilizing morcellized allograft, 2. Posterior thoracic segmental fixation T7-T11 utilizing nuvasive pedicle screws  . PROSTATECTOMY  06/2001   w/bilateral pelvic lymph nose dissection/notes 10/24/2010  . SPINAL FUSION  12/2014   Open reduction internal fixation  of L1 Chance fracture with posterior fusion T10-L4 utilizing morcellized allograft and some local autograft, segmental instrumentation T10-L4 inclusive utilizing nuvasive pedicle screws/notes 12/16/2014  . Stress Cardiolite  02/17/2003  . THOROCOTOMY WITH LOBECTOMY  03/16/2015   Procedure: THOROCOTOMY WITH LOBECTOMY;  Surgeon: Ivin Poot, MD;  Location: [place];  Service: Thoracic;;  . TONSILLECTOMY    . TOTAL THYROIDECTOMY  1997  . Venous Doppler  05/30/2004  . VENTRAL HERNIA REPAIR  04/14/2008  . VIDEO ASSISTED THORACOSCOPY Left 03/16/2015   Procedure: VIDEO ASSISTED THORACOSCOPY;  Surgeon: Ivin Poot, MD;  Location: [facility] OR;  Service: Thoracic;  Laterality: Left;    Current Outpatient Medications  Medication Sig Dispense Refill  . ALPRAZolam (XANAX) 0.5 MG tablet Take 0.5 mg by mouth 2 (two) times daily as needed.    . ASACOL HD 800 MG TBEC Take 1,600 mg by mouth 2 (two) times daily.  3  . atenolol (TENORMIN) 25 MG tablet Take 1 tablet (25 mg total) by mouth 2 (two) times daily.    . B-D ULTRAFINE III SHORT PEN 31G X 8 MM MISC USE 1 PEN NEEDLE FOUR TIMES DAILY. 100 each 0  . busPIRone (BUSPAR) 15 MG tablet Take 15 mg by mouth 2 (two) times daily.     . calcium-vitamin D (OSCAL WITH D) 500-200 MG-UNIT tablet Take 2 tablets by mouth daily.     . Glucose Blood (ASCENSIA CONTOUR TEST VI) Use as directed to test two times a day     . glucose blood (BAYER CONTOUR TEST) test strip TEST BLOOD SUGAR TWICE DAILY AS DIRECTED. 200 each 2  . glucose blood ([employer] TEST STRIPS) test strip 1 each by Other route 2 times daily.    . insulin lispro (HUMALOG KWIKPEN) 100 UNIT/ML KiwkPen INJECT UNDER THE SKIN THREE TIMES DAILY( 25 UNITS, 25 UNITS AND 20 UNITS RESPECTIVELY BEFORE MEALS) (Patient taking differently: Inject 20-30 Units into the skin See admin instructions. INJECT UNDER THE SKIN THREE TIMES DAILY( 25 units in the morning, 20 units in the afternoon, 30 units in the evening per patient.) 30 mL 5  .  Insulin Pen Needle (FIFTY50 PEN NEEDLES) 31G X 8 MM MISC Inject 31 g into the skin.    Marland Kitchen leuprolide (LUPRON) 11.25 MG injection Inject 11.25 mg into the muscle every 6 (six) months.     . levothyroxine (SYNTHROID) 125 MCG tablet Take 1 tablet (125 mcg total) by mouth daily before breakfast. 30 tablet 0  . Multiple Vitamins-Iron (DAILY-VITE/IRON/BETA-CAROTENE) TABS Take 1 tablet by mouth daily.     Marland Kitchen zinc gluconate 50 MG tablet Take 50 mg by mouth daily.    Marland Kitchen apixaban (ELIQUIS) 5 MG TABS tablet Take 1 tablet (5 mg total) by mouth 2 (two) times daily. (Patient not taking: Reported on 03/30/2020) 60 tablet 0  . aspirin 81 MG EC tablet Take 1 tablet (81 mg total) by mouth daily. Swallow whole. (Patient not taking: Reported on 03/30/2020) 30 tablet 11  . diltiazem (CARDIZEM) 30 MG tablet Take 1 tablet every 4  hours AS NEEDED for heart rate >100 as long as blood pressure >100. 45 tablet 1   No current facility-administered medications for this encounter.    No Known Allergies  Social History   Socioeconomic History  . Marital status: Married    Spouse name: Not on file  . Number of children: Not on file  . Years of education: Not on file  . Highest education level: Not on file  Occupational History  . Occupation: Retired  Tobacco Use  . Smoking status: Former Smoker    Packs/day: 2.00    Years: 30.00    Pack years: 60.00    Types: Cigarettes    Quit date: 12/26/1989    Years since quitting: 30.2  . Smokeless tobacco: Never Used  Vaping Use  . Vaping Use: Never used  Substance and Sexual Activity  . Alcohol use: Yes    Alcohol/week: 0.0 standard drinks    Comment: 02/08/2016 "might have a couple drinks/year; might not"  . Drug use: No  . Sexual activity: Never  Other Topics Concern  . Not on file  Social History Narrative   Retired Development worker, international aid   Social Determinants of Radio broadcast assistant Strain:   . Difficulty of Paying Living Expenses: Not on file  Food Insecurity:     . Worried About Charity fundraiser in the Last Year: Not on file  . Ran Out of Food in the Last Year: Not on file  Transportation Needs:   . Lack of Transportation (Medical): Not on file  . Lack of Transportation (Non-Medical): Not on file  Physical Activity:   . Days of Exercise per Week: Not on file  . Minutes of Exercise per Session: Not on file  Stress:   . Feeling of Stress : Not on file  Social Connections:   . Frequency of Communication with Friends and Family: Not on file  . Frequency of Social Gatherings with Friends and Family: Not on file  . Attends Religious Services: Not on file  . Active Member of Clubs or Organizations: Not on file  . Attends Archivist Meetings: Not on file  . Marital Status: Not on file  Intimate Partner Violence:   . Fear of Current or Ex-Partner: Not on file  . Emotionally Abused: Not on file  . Physically Abused: Not on file  . Sexually Abused: Not on file     ROS- All systems are reviewed and negative except as per the HPI above.  Physical Exam: Vitals:   03/30/20 1357  BP: 138/68  Pulse: 77  Weight: 83.3 kg  Height: 5' 11"  (1.803 m)    GEN- The patient is well appearing elderly male, alert and oriented x 3 today.   Head- normocephalic, atraumatic Eyes-  Sclera clear, conjunctiva pink Ears- hearing intact Oropharynx- clear Neck- supple  Lungs- Clear to ausculation bilaterally, normal work of breathing Heart- Regular rate and rhythm, no murmurs, rubs or gallops  GI- soft, NT, ND, + BS Extremities- no clubbing, cyanosis, or edema MS- no significant deformity or atrophy Skin- no rash or lesion Psych- euthymic mood, full affect Neuro- strength and sensation are intact  Wt Readings from Last 3 Encounters:  03/30/20 83.3 kg  03/26/20 84.8 kg  02/21/20 82.6 kg    EKG today demonstrates SR HR 77, PR 118, QRS 86, QTc 443  Echo 01/05/20 demonstrated  1. Left ventricular ejection fraction, by estimation, is 60 to 65%.  The  left ventricle has normal function.  The left ventricle has no regional  wall motion abnormalities. There is mild left ventricular hypertrophy of  the posterior segment. Left  ventricular diastolic parameters are consistent with Grade I diastolic  dysfunction (impaired relaxation).  2. Right ventricular systolic function is normal. The right ventricular  size is normal. There is normal pulmonary artery systolic pressure.  3. The mitral valve is normal in structure. Mild mitral valve  regurgitation. No evidence of mitral stenosis.  4. The aortic valve is tricuspid. Aortic valve regurgitation is not  visualized. No aortic stenosis is present.  5. The inferior vena cava is normal in size with greater than 50%  respiratory variability, suggesting right atrial pressure of 3 mmHg.  Epic records are reviewed at length today  CHA2DS2-VASc Score = 4  The patient's score is based upon: CHF History: 0 HTN History: 1 Diabetes History: 1 Stroke History: 0 Vascular Disease History: 0 Age Score: 2 Gender Score: 0      ASSESSMENT AND PLAN: 1. Paroxysmal Atrial Fibrillation (ICD10:  I48.0) The patient's CHA2DS2-VASc score is 4, indicating a 4.8% annual risk of stroke.   General education about afib provided and questions answered. We also discussed his stroke risk and the risks and benefits of anticoagulation. Will start PRN diltiazem 30 mg q 4 hours for heart racing. Will not increase daily rate control given h/o significant orthostatic hypotension.  Start Eliquis 5 mg BID Continue atenolol 25 mg BID  2. Secondary Hypercoagulable State (ICD10:  D68.69) The patient is at significant risk for stroke/thromboembolism based upon his CHA2DS2-VASc Score of 4.  Continue Apixaban (Eliquis).   3. HTN Stable, no changes today.   Follow up in the AF clinic in one month.    [facility] [address] [place], [place]  [id number] [phone] 03/30/2020 3:35 PM

## 2020-04-07 ENCOUNTER — Telehealth: Payer: Self-pay | Admitting: Internal Medicine

## 2020-04-07 NOTE — Telephone Encounter (Signed)
Patient c/o Palpitations:  High priority if patient c/o lightheadedness, shortness of breath, or chest pain  1) How long have you had palpitations/irregular HR/ Afib? Are you having the symptoms now? 30-45 min; yes   2) Are you currently experiencing lightheadedness, SOB or CP? Lightheadedness; SOB  3) Do you have a history of afib (atrial fibrillation) or irregular heart rhythm? No  4) Have you checked your BP or HR? (document readings if available): 103/72; 71  5) Are you experiencing any other symptoms? No

## 2020-04-07 NOTE — Telephone Encounter (Signed)
Complaining of being lightheaded and thinks he may be in Afib. His Blood pressure is 103/72, heart rate 71, oxygen 94 %. Took one cardizem about ~20 minutes ago.   He says he is feeling a lot better.  Heart rate 63.  The prn cardizem has made him feel "much better" so we will continue as planned with the follow up with Ricky in the [facility]. Patient given ED precautions.   Patient verbalized understanding.

## 2020-04-10 ENCOUNTER — Telehealth: Payer: Self-pay | Admitting: Medical

## 2020-04-10 NOTE — Telephone Encounter (Signed)
   Patient called the after hours line to report a problem with his medications. He reports an episode of hematuria this evening. This is a new problem. No prior issues with hematuria. Notes urine was pink tinged. No dysuria or abdominal pain to suggest infection or kidney stone. He follows with a urologist outpatient and was encouraged to call them first thing tomorrow to arrange an appointment. If not able to be seen this week, encouraged PCP follow-up to r/o UTI and check Hgb while he awaits urology follow-up. He has taken his prn diltiazem once over the weekend for presumed PAF episode. If bleeding persists, may need to stop eliquis despite CHA2DS2-VASc score of 4. Patient was in agreement with the plan.   Abigail Butts, PA-C 04/10/20; 6:20 PM

## 2020-04-25 ENCOUNTER — Telehealth (HOSPITAL_COMMUNITY): Payer: Self-pay | Admitting: *Deleted

## 2020-04-25 NOTE — Telephone Encounter (Signed)
Patient called in stating he went into AF this morning he took 2 doses of Cardizem now his HR is 72 BP 84/51. Feeling slightly lightheaded and fatigued but otherwise ok. He is now back in NSR. When asked what HRs were running in AF he stated 40 - education given to patient on appropriate use of cardizem when in AF. Instructed pt to increase water intake this afternoon to help increase his BP. He will keep scheduled follow up for Monday unless issues arise before then. Pt in agreement and verbalized understanding.

## 2020-04-29 IMAGING — CT CT CHEST W/O CM
2 of 5 series · 11 of 36 positions shown, 13 images · non-contrast
Comparison: 02/18/2017.

CLINICAL DATA: Left lung cancer, prostate cancer, remote history of
thyroid cancer.

EXAM:
CT CHEST WITHOUT CONTRAST
TECHNIQUE: Multidetector CT imaging of the chest was performed following the
standard protocol without IV contrast.

[Series 2: thorax · axial · 0.91mm/px · z∈[-197,+55]mm · 8 of 164 slices shown, 10 images]
[im 19/164  mediastinal]
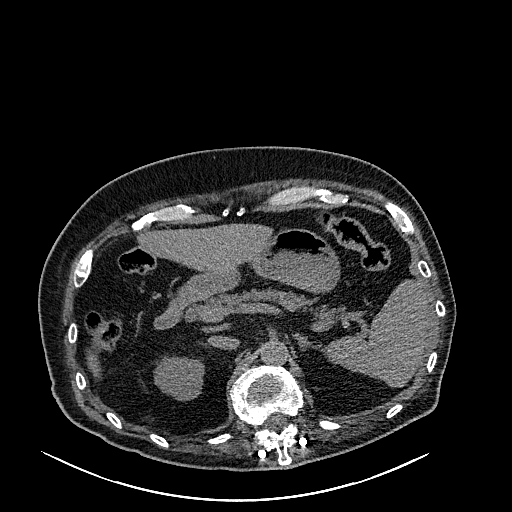
[im 19/164  lung]
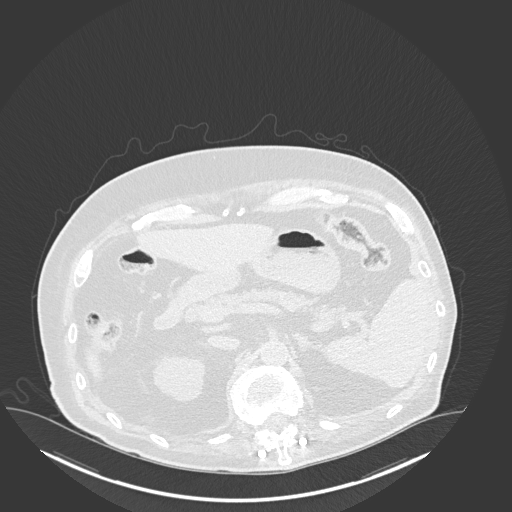
[im 37/164  lung]
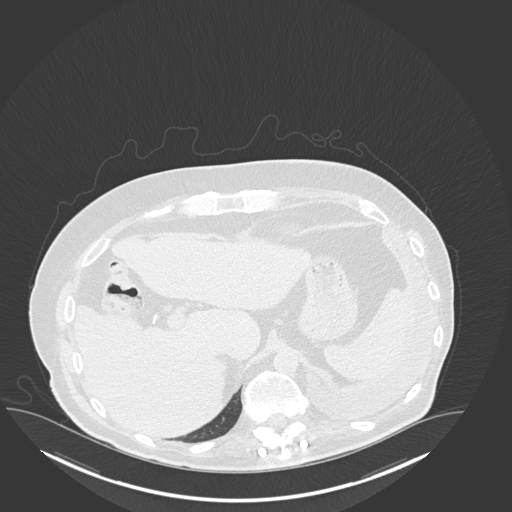
[im 55/164  lung]
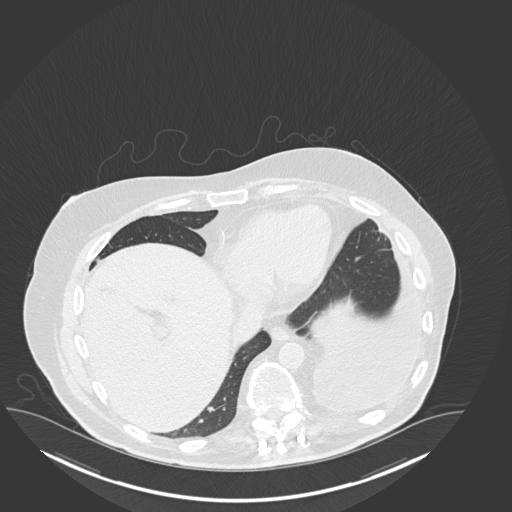
[im 73/164  lung]
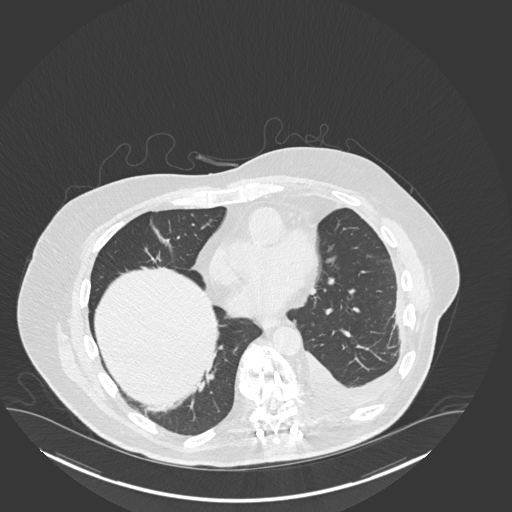
[im 91/164  mediastinal]
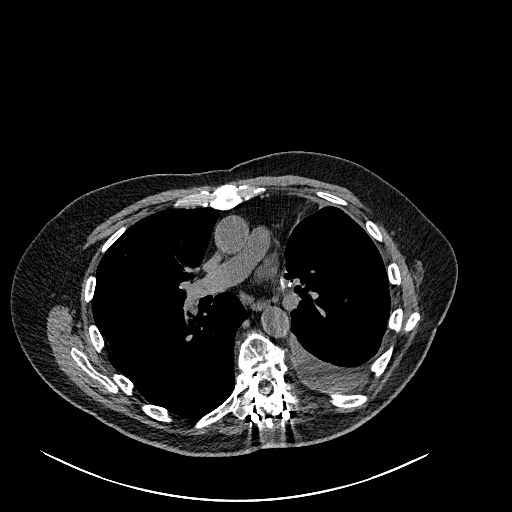
[im 91/164  lung]
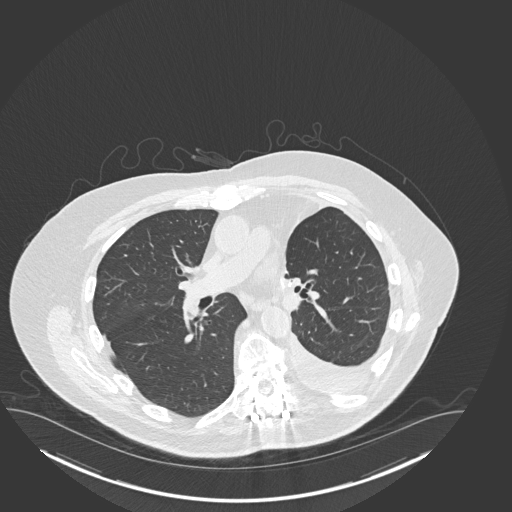
[im 109/164  lung]
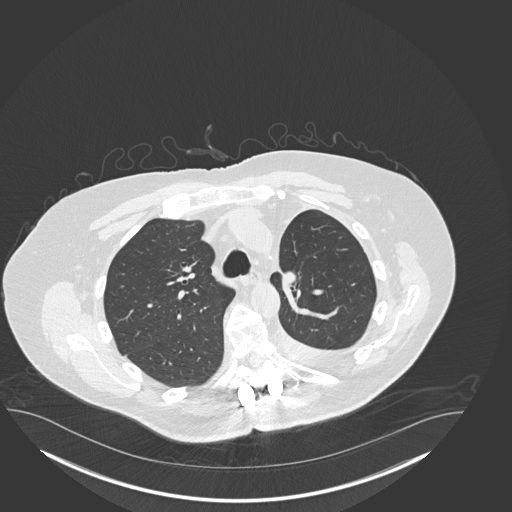
[im 127/164  lung]
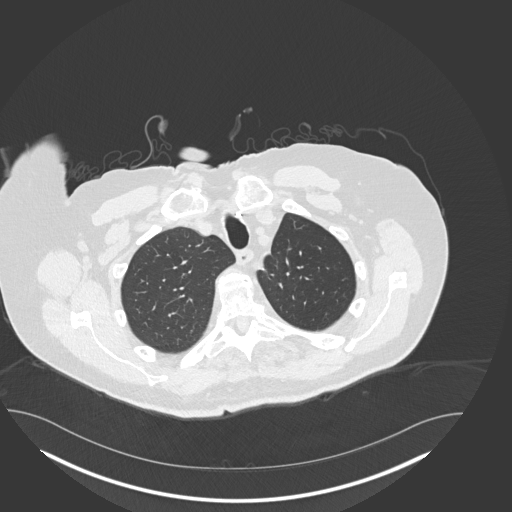
[im 145/164  lung]
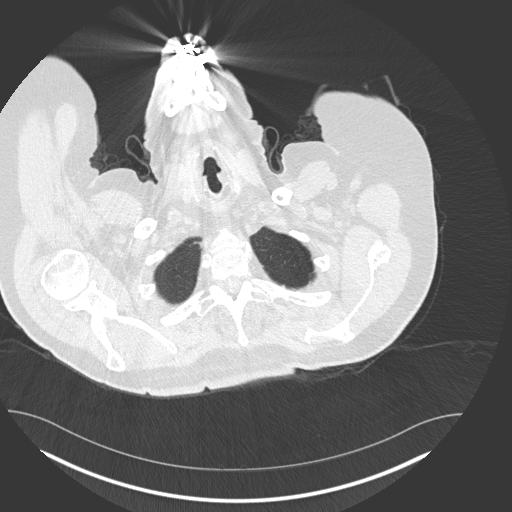

[Series 5: coronal · coronal · 0.80mm/px · 3 of 140 slices shown]
[im 28/140  lung]
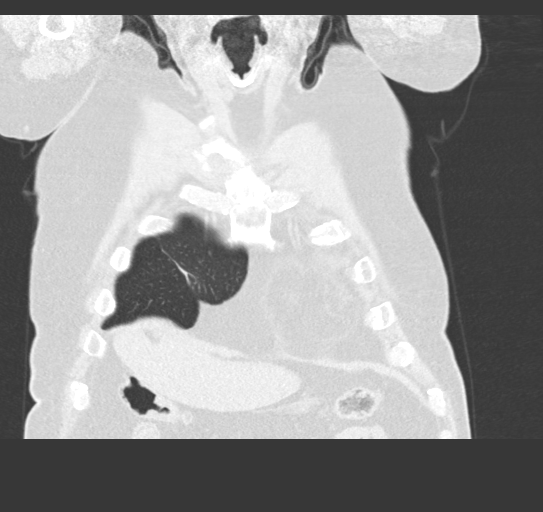
[im 56/140  lung]
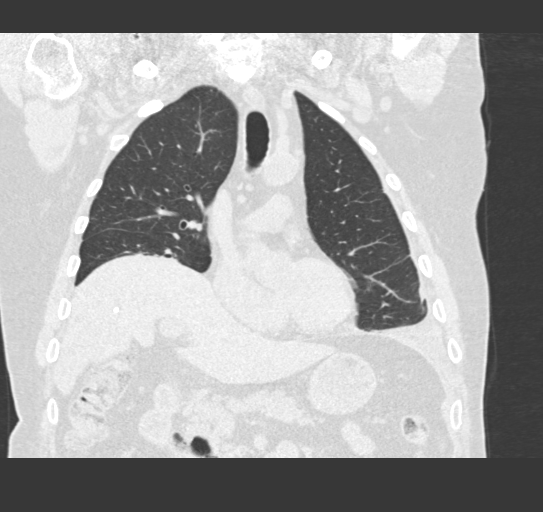
[im 84/140  lung]
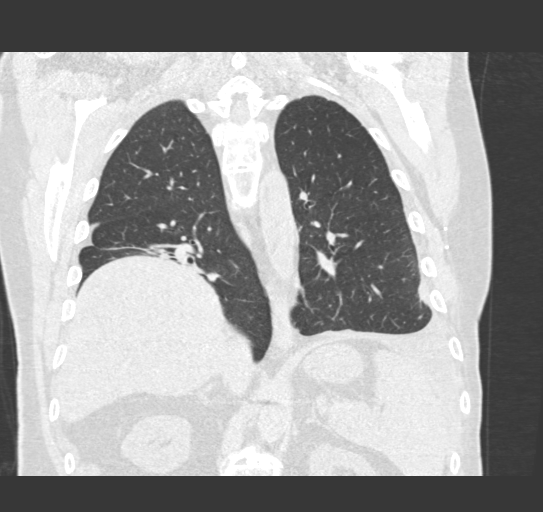

[11 of 36 positions shown; findings below may reference images not displayed]

FINDINGS: Cardiovascular: Heart size normal. Coronary artery calcification. No
pericardial effusion.

Mediastinum/Nodes: No pathologically enlarged mediastinal or
axillary lymph nodes. Hilar regions are difficult to definitively
evaluate without IV contrast but appear grossly unremarkable.
Esophagus is grossly unremarkable.

Lungs/Pleura: Mild bronchiectasis and scarring in the right middle
lobe and right lower lobe. Nodules in the left lower lobe measure up
to 4 mm, stable 10/10/2015 and considered benign. Left upper
lobectomy. Left pleural effusion is moderate in size and has
increased from 02/18/2017. There is associated pleural thickening,
indicative chronicity. Subpleural atelectasis/scarring in the left
lower lobe. Airway is otherwise unremarkable.

Upper Abdomen: Visualized portions of the liver and adrenal glands
are unremarkable. Stone or renal vascular calcification in the right
kidney. Visualized portions of the kidneys, spleen, pancreas and
stomach are otherwise grossly unremarkable. Duodenal diverticulum is
incidentally noted. Cholecystectomy. No upper abdominal adenopathy.

Musculoskeletal: Postoperative and degenerative changes in the
spine. No worrisome lytic or sclerotic lesions.
IMPRESSION: 1. Left upper lobectomy. No evidence of recurrence or metastatic
disease.
2. Moderate left pleural effusion, increased from 02/18/2017.
3. Coronary artery calcification.
4. Right renal stone or vascular calcification.

## 2020-05-01 ENCOUNTER — Encounter (HOSPITAL_COMMUNITY): Payer: Self-pay | Admitting: Physician Assistant

## 2020-05-01 ENCOUNTER — Other Ambulatory Visit: Payer: Self-pay | Admitting: Cardiovascular Disease

## 2020-05-01 ENCOUNTER — Other Ambulatory Visit: Payer: Self-pay

## 2020-05-01 ENCOUNTER — Ambulatory Visit (HOSPITAL_COMMUNITY)
Admission: RE | Admit: 2020-05-01 | Discharge: 2020-05-01 | Disposition: A | Discharge status: Home / Self Care | Payer: Medicare Other | Source: Ambulatory Visit | Attending: Physician Assistant | Admitting: Physician Assistant

## 2020-05-01 VITALS — BP 150/82 | HR 70 | Ht 71.0 in | Wt 185.6 lb

## 2020-05-01 DIAGNOSIS — R319 Hematuria, unspecified: Secondary | ICD-10-CM | POA: Insufficient documentation

## 2020-05-01 DIAGNOSIS — I480 Paroxysmal atrial fibrillation: Secondary | ICD-10-CM | POA: Insufficient documentation

## 2020-05-01 DIAGNOSIS — D6869 Other thrombophilia: Secondary | ICD-10-CM | POA: Insufficient documentation

## 2020-05-01 DIAGNOSIS — I10 Essential (primary) hypertension: Secondary | ICD-10-CM | POA: Insufficient documentation

## 2020-05-01 LAB — BASIC METABOLIC PANEL
Anion gap: 11 (ref 5–15)
BUN: 19 mg/dL (ref 8–23)
CO2: 33 mmol/L — ABNORMAL HIGH (ref 22–32)
Calcium: 9.2 mg/dL (ref 8.9–10.3)
Chloride: 98 mmol/L (ref 98–111)
Creatinine, Ser: 1.47 mg/dL — ABNORMAL HIGH (ref 0.61–1.24)
GFR, Estimated: 49 mL/min — ABNORMAL LOW (ref >60)
Glucose, Bld: 135 mg/dL — ABNORMAL HIGH (ref 70–99)
Potassium: 4.1 mmol/L (ref 3.5–5.1)
Sodium: 142 mmol/L (ref 135–145)

## 2020-05-01 LAB — CBC
HCT: 41.9 % (ref 39.0–52.0)
Hemoglobin: 12.8 g/dL — ABNORMAL LOW (ref 13.0–17.0)
MCH: 30 pg (ref 26.0–34.0)
MCHC: 30.5 g/dL (ref 30.0–36.0)
MCV: 98.1 fL (ref 80.0–100.0)
Platelets: 318 10*3/uL (ref 150–400)
RBC: 4.27 MIL/uL (ref 4.22–5.81)
RDW: 13.9 % (ref 11.5–15.5)
WBC: 10.6 10*3/uL — ABNORMAL HIGH (ref 4.0–10.5)
nRBC: 0 % (ref 0.0–0.2)

## 2020-05-01 MED ORDER — APIXABAN 5 MG PO TABS: 5.0000 mg | ORAL_TABLET | Freq: Two times a day (BID) | ORAL | 1 refills | Status: DC

## 2020-05-01 NOTE — Progress Notes (Signed)
Primary Care Physician: Devin Solian, MD Primary Cardiologist: Dr Devin [name] Primary Electrophysiologist: none Referring Physician: Dr Devin [name] is a 79 y.o. male with a history of nonsmall cell lung CA, HTN, hypothyroidism, Chron disease, DM, and paroxysmal atrial fibrillation who presents for follow up in the [facility]. The patient was initially diagnosed with atrial fibrillation 03/26/20 after presenting with symptoms of fatigue and presyncope. EMS was called and he was hypotensive and in afib with RVR. He was given a bolus of fluid and IV diltiazem and converted to SR en route to the ED. Patient was started on Eliquis for a CHADS2VASC score of 4. There were no specific triggers he could identify.   On follow up today, patient reports he has done reasonably well since his last visit. He brings in a log of BP and heart rates. He has not had any heart rates > 90 bpm. He did take PRN doses of diltiazem as he thought he was in afib with elevated BP. He has noted blood in his urine and has an appointment with his urologist on 05/08/20.   Today, he denies symptoms of palpitations, chest pain, shortness of breath, orthopnea, PND, lower extremity edema, dizziness, presyncope, syncope, snoring, daytime somnolence, bleeding, or neurologic sequela. The patient is tolerating medications without difficulties and is otherwise without complaint today.    Atrial Fibrillation Risk Factors:  he does not have symptoms or diagnosis of sleep apnea. he does not have a history of rheumatic fever. he does not have a history of alcohol use. The patient does not have a history of early familial atrial fibrillation or other arrhythmias.  he has a BMI of Body mass index is 25.89 kg/m.Marland Kitchen Filed Weights   05/01/20 1443  Weight: 84.2 kg    Family History  Problem Relation Age of Onset  . CAD Mother   . Cancer Neg Hx        No cancer in the patient's immediate family,  except of course for the patient himself, as noted     Atrial Fibrillation Management history:  Previous antiarrhythmic drugs: none Previous cardioversions: none Previous ablations: none CHADS2VASC score: 4 Anticoagulation history: Eliquis   Past Medical History:  Diagnosis Date  . Adenocarcinoma of left lung, stage 1 ([place]) 03/10/2015  . Ankylosing spondylitis ([place])    Diagnosed during lumbar fracture summer of 2016    . Crohn's disease ([place])   . Gout   . HIstory of basal cell cancer of face    THYROID CA HX  . Hypertension   . Hypocalcemia 04/13/2007  . Hypothyroidism   . Impotence   . 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 Obesity (BMI 30-39.9)   . Osteoporosis    Pt completed 5 years of fosamax in 2013     . Prostate cancer with recurrence    Treated with prostatectomy with recurrence 2012 with Lupron treatment now him   . Psoriasis   . Thyroid cancer ([place]) 1999   Treated with RAI and total thyroidectomy   . Type II diabetes mellitus ([place])    Past Surgical History:  Procedure Laterality Date  . ABDOMINAL EXPLORATION SURGERY     for small bowel obstruction  . APPENDECTOMY  03/2007  . BACK SURGERY    . BASAL CELL CARCINOMA EXCISION  "several"   "head"  . CARDIAC CATHETERIZATION  03/17/2003  . CHOLECYSTECTOMY N/A 05/07/2016   Procedure: LAPAROSCOPIC CHOLECYSTECTOMY;  Surgeon: Fanny Skates,  MD;  Location: [facility];  Service: General;  Laterality: N/A;  . COLON SURGERY  03/2007   Resection of cecum, appendix, terminal ileum (approximately/notes 10/10/2010  . HERNIA REPAIR    . LAPAROSCOPIC CHOLECYSTECTOMY  05/07/2016  . LAPAROSCOPIC LYSIS OF ADHESIONS  05/07/2016  . LAPAROSCOPIC LYSIS OF ADHESIONS N/A 05/07/2016   Procedure: LAPAROSCOPIC LYSIS OF ADHESIONS TIMES ONE HOUR;  Surgeon: Fanny Skates, MD;  Location: [facility];  Service: General;  Laterality: N/A;  . POSTERIOR FUSION THORACIC SPINE  02/08/2016   1. Posterior thoracic arthrodesis  T7-T11 utilizing morcellized allograft, 2. Posterior thoracic segmental fixation T7-T11 utilizing nuvasive pedicle screws  . PROSTATECTOMY  06/2001   w/bilateral pelvic lymph nose dissection/notes 10/24/2010  . SPINAL FUSION  12/2014   Open reduction internal fixation of L1 Chance fracture with posterior fusion T10-L4 utilizing morcellized allograft and some local autograft, segmental instrumentation T10-L4 inclusive utilizing nuvasive pedicle screws/notes 12/16/2014  . Stress Cardiolite  02/17/2003  . THOROCOTOMY WITH LOBECTOMY  03/16/2015   Procedure: THOROCOTOMY WITH LOBECTOMY;  Surgeon: Ivin Poot, MD;  Location: [facility];  Service: Thoracic;;  . TONSILLECTOMY    . TOTAL THYROIDECTOMY  1997  . Venous Doppler  05/30/2004  . VENTRAL HERNIA REPAIR  04/14/2008  . VIDEO ASSISTED THORACOSCOPY Left 03/16/2015   Procedure: VIDEO ASSISTED THORACOSCOPY;  Surgeon: Ivin Poot, MD;  Location: [facility] OR;  Service: Thoracic;  Laterality: Left;    Current Outpatient Medications  Medication Sig Dispense Refill  . ALPRAZolam (XANAX) 0.5 MG tablet Take 0.5 mg by mouth 2 (two) times daily as needed.    Marland Kitchen apixaban (ELIQUIS) 5 MG TABS tablet Take 1 tablet (5 mg total) by mouth 2 (two) times daily. 180 tablet 1  . ASACOL HD 800 MG TBEC Take 1,600 mg by mouth 2 (two) times daily.  3  . atenolol (TENORMIN) 25 MG tablet Take 1 tablet (25 mg total) by mouth 2 (two) times daily.    . B-D ULTRAFINE III SHORT PEN 31G X 8 MM MISC USE 1 PEN NEEDLE FOUR TIMES DAILY. 100 each 0  . busPIRone (BUSPAR) 15 MG tablet Take 15 mg by mouth 2 (two) times daily.     . calcium-vitamin D (OSCAL WITH D) 500-200 MG-UNIT tablet Take 2 tablets by mouth daily.     Marland Kitchen diltiazem (CARDIZEM) 30 MG tablet Take 1 tablet every 4 hours AS NEEDED for heart rate >100 as long as blood pressure >100. 45 tablet 1  . Glucose Blood (ASCENSIA CONTOUR TEST VI) Use as directed to test two times a day     . glucose blood (BAYER CONTOUR TEST) test strip TEST  BLOOD SUGAR TWICE DAILY AS DIRECTED. 200 each 2  . glucose blood ([employer] TEST STRIPS) test strip 1 each by Other route 2 times daily.    . insulin lispro (HUMALOG KWIKPEN) 100 UNIT/ML KiwkPen INJECT UNDER THE SKIN THREE TIMES DAILY( 25 UNITS, 25 UNITS AND 20 UNITS RESPECTIVELY BEFORE MEALS) (Patient taking differently: Inject 20-30 Units into the skin See admin instructions. INJECT UNDER THE SKIN THREE TIMES DAILY( 25 units in the morning, 20 units in the afternoon, 30 units in the evening per patient.) 30 mL 5  . Insulin Pen Needle (FIFTY50 PEN NEEDLES) 31G X 8 MM MISC Inject 31 g into the skin.    Marland Kitchen leuprolide (LUPRON) 11.25 MG injection Inject 11.25 mg into the muscle every 6 (six) months.     . levothyroxine (SYNTHROID) 125 MCG tablet Take 1 tablet (125  mcg total) by mouth daily before breakfast. 30 tablet 0  . Multiple Vitamins-Iron (DAILY-VITE/IRON/BETA-CAROTENE) TABS Take 1 tablet by mouth daily.     Marland Kitchen zinc gluconate 50 MG tablet Take 50 mg by mouth daily.     No current facility-administered medications for this encounter.    No Known Allergies  Social History   Socioeconomic History  . Marital status: Married    Spouse name: Not on file  . Number of children: Not on file  . Years of education: Not on file  . Highest education level: Not on file  Occupational History  . Occupation: Retired  Tobacco Use  . Smoking status: Former Smoker    Packs/day: 2.00    Years: 30.00    Pack years: 60.00    Types: Cigarettes    Quit date: 12/26/1989    Years since quitting: 30.3  . Smokeless tobacco: Never Used  Vaping Use  . Vaping Use: Never used  Substance and Sexual Activity  . Alcohol use: Yes    Alcohol/week: 0.0 standard drinks    Comment: 02/08/2016 "might have a couple drinks/year; might not"  . Drug use: No  . Sexual activity: Never  Other Topics Concern  . Not on file  Social History Narrative   Retired Development worker, international aid   Social Determinants of Radio broadcast assistant  Strain:   . Difficulty of Paying Living Expenses: Not on file  Food Insecurity:   . Worried About Charity fundraiser in the Last Year: Not on file  . Ran Out of Food in the Last Year: Not on file  Transportation Needs:   . Lack of Transportation (Medical): Not on file  . Lack of Transportation (Non-Medical): Not on file  Physical Activity:   . Days of Exercise per Week: Not on file  . Minutes of Exercise per Session: Not on file  Stress:   . Feeling of Stress : Not on file  Social Connections:   . Frequency of Communication with Friends and Family: Not on file  . Frequency of Social Gatherings with Friends and Family: Not on file  . Attends Religious Services: Not on file  . Active Member of Clubs or Organizations: Not on file  . Attends Archivist Meetings: Not on file  . Marital Status: Not on file  Intimate Partner Violence:   . Fear of Current or Ex-Partner: Not on file  . Emotionally Abused: Not on file  . Physically Abused: Not on file  . Sexually Abused: Not on file     ROS- All systems are reviewed and negative except as per the HPI above.  Physical Exam: Vitals:   05/01/20 1443  BP: (!) 150/82  Pulse: 70  Weight: 84.2 kg  Height: 5' 11"  (1.803 m)    GEN- The patient is well appearing elderly male, alert and oriented x 3 today.   HEENT-head normocephalic, atraumatic, sclera clear, conjunctiva pink, hearing intact, trachea midline. Lungs- Clear to ausculation bilaterally, normal work of breathing Heart- Regular rate and rhythm, no murmurs, rubs or gallops  GI- soft, NT, ND, + BS Extremities- no clubbing, cyanosis, or edema MS- no significant deformity or atrophy Skin- no rash or lesion Psych- euthymic mood, full affect Neuro- strength and sensation are intact   Wt Readings from Last 3 Encounters:  05/01/20 84.2 kg  03/30/20 83.3 kg  03/26/20 84.8 kg    EKG today demonstrates SR HR 70, PR 114, QRS 88, QTc 447  Echo 01/05/20 demonstrated  1.  Left ventricular ejection fraction, by estimation, is 60 to 65%. The  left ventricle has normal function. The left ventricle has no regional  wall motion abnormalities. There is mild left ventricular hypertrophy of  the posterior segment. Left  ventricular diastolic parameters are consistent with Grade I diastolic  dysfunction (impaired relaxation).  2. Right ventricular systolic function is normal. The right ventricular  size is normal. There is normal pulmonary artery systolic pressure.  3. The mitral valve is normal in structure. Mild mitral valve  regurgitation. No evidence of mitral stenosis.  4. The aortic valve is tricuspid. Aortic valve regurgitation is not  visualized. No aortic stenosis is present.  5. The inferior vena cava is normal in size with greater than 50%  respiratory variability, suggesting right atrial pressure of 3 mmHg.  Epic records are reviewed at length today  CHA2DS2-VASc Score = 4  The patient's score is based upon: CHF History: 0 HTN History: 1 Diabetes History: 1 Stroke History: 0 Vascular Disease History: 0 Age Score: 2 Gender Score: 0      ASSESSMENT AND PLAN: 1. Paroxysmal Atrial Fibrillation (ICD10:  I48.0) The patient's CHA2DS2-VASc score is 4, indicating a 4.8% annual risk of stroke.   Patient in [place] today.  Educated patient on proper use of PRN diltiazem. He has not had any heart racing.Continue PRN diltiazem 30 mg q 4 hours for heart racing. Will not increase daily rate control given h/o significant orthostatic hypotension.  Continue Eliquis 5 mg BID Continue atenolol 25 mg BID We discussed Kardia mobile for home monitoring. Can consider flecainide if afib becomes more persistent.   2. Secondary Hypercoagulable State (ICD10:  D68.69) The patient is at significant risk for stroke/thromboembolism based upon his CHA2DS2-VASc Score of 4.  Continue Apixaban (Eliquis).   3. HTN Stable, treating conservatively with h/o orthostatic  hypotension.  No changes today.  4. Hematuria Patient has appt scheduled with urology.    Follow up in the AF clinic in 3 months.    [facility] [address] [place],  [address] [phone] 05/01/2020 2:52 PM

## 2020-05-01 NOTE — Telephone Encounter (Signed)
Pt last saw Clint Fenton, PA on 03/30/20, last labs 03/26/20 1.33 Creat, age 79, weight 83.3kg, based on specified criteria pt is on appropriate dosage of Eliquis 32m BID.  Will refill rx.

## 2020-05-01 NOTE — Telephone Encounter (Signed)
THIS IS A DR ROSS PATIENT.  Accidentally clicked on [place] from care team.   *STAT* If patient is at the pharmacy, call can be transferred to refill team.   1. Which medications need to be refilled? (please list name of each medication and dose if known)  apixaban (ELIQUIS) 5 MG TABS tablet(Expired)  2. Which pharmacy/location (including street and city if local pharmacy) is medication to be sent to? [facility] [place] [address]  3. Do they need a 30 day or 90 day supply?  90 day   Patient is completely out of medication.

## 2020-05-30 ENCOUNTER — Other Ambulatory Visit: Payer: Self-pay | Admitting: Urology

## 2020-06-27 NOTE — Progress Notes (Addendum)
COVID Vaccine Completed:  x2 Date COVID Vaccine completed: 06-19-19 & 07-25-19 COVID vaccine manufacturer:    Moderna     PCP - Prince Solian, MD Cardiologist - Dorris Carnes, MD  Chest x-ray - 03-26-20 in Epic EKG - 05-01-20 in Epic Stress Test - 01-05-20 in Epic ECHO - 01-05-20 in Epic Cardiac Cath -  Pacemaker/ICD device last checked:  Sleep Study - N/A CPAP -   Fasting Blood Sugar - 65 to 250 Checks Blood Sugar - 2 times a day  Blood Thinner Instructions:  Eliquis 5 mg.  To hold 3 days prior to procedure Aspirin Instructions: Last Dose:06-27-20  Anesthesia review:  Afib  Patient denies shortness of breath, fever, cough and chest pain at PAT appointment. Pt able to climb a flight of stairs, do housework and continues to drive independently.  BUN 27 and creatinine 1.66 on labs 06-28-20   Patient verbalized understanding of instructions that were given to them at the PAT appointment. Patient was also instructed that they will need to review over the PAT instructions again at home before surgery.

## 2020-06-27 NOTE — Patient Instructions (Addendum)
DUE TO COVID-19 ONLY ONE VISITOR IS ALLOWED TO COME WITH YOU AND STAY IN THE WAITING ROOM ONLY DURING PRE OP AND PROCEDURE.   IF YOU WILL BE ADMITTED INTO THE HOSPITAL YOU ARE ALLOWED ONE SUPPORT PERSON DURING VISITATION HOURS ONLY (10AM -8PM)   . The support person may change daily. . The support person must pass our screening, gel in and out, and wear a mask at all times, including in the patient's room. . Patients must also wear a mask when staff or their support person are in the room.   COVID SWAB TESTING MUST BE COMPLETED ON:  Thursday, 06-29-20 @ 11:30 AM   [address] [place],  [address]  (Must self quarantine after testing. Follow instructions on handout.)    Your procedure is scheduled on: Monday, 07-03-20   Report to [facility] Main  Entrance    Report to admitting at 8:30 AM   Call this number if you have problems the morning of surgery [phone]   Do not eat food :After Midnight.   May have liquids until 7:30 AM day of surgery  CLEAR LIQUID DIET  Foods Allowed                                                                     Foods Excluded  Water, Black Coffee and tea, regular and decaf            liquids that you cannot  Plain Jell-O in any flavor  (No red)                                   see through such as: Fruit ices (not with fruit pulp)                                      milk, soups, orange juice              Iced Popsicles (No red)                                      All solid food                                   Apple juices Sports drinks like Gatorade (No red) Lightly seasoned clear broth or consume(fat free) Sugar, honey syrup      Oral Hygiene is also important to reduce your risk of infection.                                    Remember - BRUSH YOUR TEETH THE MORNING OF SURGERY WITH YOUR REGULAR TOOTHPASTE   Do NOT smoke after Midnight   Take these medicines the morning of surgery with A SIP OF WATER:  Atenolol,  Buspirone, Levothyroxine, Diltiazem if needed and Alprazolam if needed   How to Manage Your Diabetes  BEfore and After Surgery  Why is it important to control my blood sugar before and after surgery? . Improving blood sugar levels before and after surgery helps healing and can limit problems. . A way of improving blood sugar control is eating a healthy diet by: o  Eating less sugar and carbohydrates o  Increasing activity/exercise o  Talking with your doctor about reaching your blood sugar goals . High blood sugars (greater than 180 mg/dL) can raise your risk of infections and slow your recovery, so you will need to focus on controlling your diabetes during the weeks before surgery. . Make sure that the doctor who takes care of your diabetes knows about your planned surgery including the date and location.  How do I manage my blood sugar before surgery? . Check your blood sugar at least 4 times a day, starting 2 days before surgery, to make sure that the level is not too high or low. o Check your blood sugar the morning of your surgery when you wake up and every 2 hours until you get to the Short Stay unit. . If your blood sugar is less than 70 mg/dL, you will need to treat for low blood sugar: o Do not take insulin. o Treat a low blood sugar (less than 70 mg/dL) with  cup of clear juice (cranberry or apple), 4 glucose tablets, OR glucose gel. o Recheck blood sugar in 15 minutes after treatment (to make sure it is greater than 70 mg/dL). If your blood sugar is not greater than 70 mg/dL on recheck, call [phone] for further instructions. . Report your blood sugar to the short stay nurse when you get to Short Stay.  . If you are admitted to the hospital after surgery: o Your blood sugar will be checked by the staff and you will probably be given insulin after surgery (instead of oral diabetes medicines) to make sure you have good blood sugar levels. o The goal for blood sugar control after  surgery is 80-180 mg/dL.   WHAT DO I DO ABOUT MY DIABETES MEDICATION?  Marland Kitchen Do not take oral diabetes medicines (pills) the morning of surgery.  . THE NIGHT BEFORE SURGERY:  No bedtime dose .       Marland Kitchen THE MORNING OF SURGERY:  Take half of Insulin Lispro dose if CBG greater than 220.  Reviewed and Endorsed by [facility] Patient Education Committee, August 2015                               You may not have any metal on your body including jewelry, and body piercings             Do not wear  lotions, powders, perfumes/cologne, or deodorant             Men may shave face and neck.   Do not bring valuables to the hospital. [place].   Contacts, dentures or bridgework may not be worn into surgery.   Patients discharged the day of surgery will not be allowed to drive home.               Please read over the following fact sheets you were given: IF YOU HAVE QUESTIONS ABOUT YOUR PRE OP INSTRUCTIONS PLEASE CALL [phone]   [facility] - Preparing for Surgery Before surgery, you can play an important role.  Because skin is not  sterile, your skin needs to be as free of germs as possible.  You can reduce the number of germs on your skin by washing with CHG (chlorahexidine gluconate) soap before surgery.  CHG is an antiseptic cleaner which kills germs and bonds with the skin to continue killing germs even after washing. Please DO NOT use if you have an allergy to CHG or antibacterial soaps.  If your skin becomes reddened/irritated stop using the CHG and inform your nurse when you arrive at Short Stay. Do not shave (including legs and underarms) for at least 48 hours prior to the first CHG shower.  You may shave your face/neck.  Please follow these instructions carefully:  1.  Shower with CHG Soap the night before surgery and the  morning of surgery.  2.  If you choose to wash your hair, wash your hair first as usual with your normal  shampoo.  3.  After  you shampoo, rinse your hair and body thoroughly to remove the shampoo.                             4.  Use CHG as you would any other liquid soap.  You can apply chg directly to the skin and wash.  Gently with a scrungie or clean washcloth.  5.  Apply the CHG Soap to your body ONLY FROM THE NECK DOWN.   Do   not use on face/ open                           Wound or open sores. Avoid contact with eyes, ears mouth and   genitals (private parts).                       Wash face,  Genitals (private parts) with your normal soap.             6.  Wash thoroughly, paying special attention to the area where your    surgery  will be performed.  7.  Thoroughly rinse your body with warm water from the neck down.  8.  DO NOT shower/wash with your normal soap after using and rinsing off the CHG Soap.                9.  Pat yourself dry with a clean towel.            10.  Wear clean pajamas.            11.  Place clean sheets on your bed the night of your first shower and do not  sleep with pets. Day of Surgery : Do not apply any lotions/deodorants the morning of surgery.  Please wear clean clothes to the hospital/surgery center.  FAILURE TO FOLLOW THESE INSTRUCTIONS MAY RESULT IN THE CANCELLATION OF YOUR SURGERY  PATIENT SIGNATURE_________________________________  NURSE SIGNATURE__________________________________  ________________________________________________________________________

## 2020-06-28 ENCOUNTER — Encounter (HOSPITAL_COMMUNITY)
Admission: RE | Admit: 2020-06-28 | Discharge: 2020-06-28 | Disposition: A | Discharge status: Home / Self Care | Payer: Medicare Other | Source: Ambulatory Visit | Attending: Urology | Admitting: Urology

## 2020-06-28 ENCOUNTER — Encounter (HOSPITAL_COMMUNITY): Payer: Self-pay

## 2020-06-28 ENCOUNTER — Other Ambulatory Visit: Payer: Self-pay

## 2020-06-28 DIAGNOSIS — Z01812 Encounter for preprocedural laboratory examination: Secondary | ICD-10-CM | POA: Diagnosis present

## 2020-06-28 HISTORY — DX: Unspecified atrial fibrillation: I48.91

## 2020-06-28 HISTORY — DX: Personal history of urinary calculi: Z87.442

## 2020-06-28 LAB — BASIC METABOLIC PANEL
Anion gap: 10 (ref 5–15)
BUN: 27 mg/dL — ABNORMAL HIGH (ref 8–23)
CO2: 34 mmol/L — ABNORMAL HIGH (ref 22–32)
Calcium: 9.4 mg/dL (ref 8.9–10.3)
Chloride: 96 mmol/L — ABNORMAL LOW (ref 98–111)
Creatinine, Ser: 1.66 mg/dL — ABNORMAL HIGH (ref 0.61–1.24)
GFR, Estimated: 42 mL/min — ABNORMAL LOW (ref >60)
Glucose, Bld: 160 mg/dL — ABNORMAL HIGH (ref 70–99)
Potassium: 4 mmol/L (ref 3.5–5.1)
Sodium: 140 mmol/L (ref 135–145)

## 2020-06-28 LAB — GLUCOSE, CAPILLARY: Glucose-Capillary: 159 mg/dL — ABNORMAL HIGH (ref 70–99)

## 2020-06-28 LAB — CBC
HCT: 42.3 % (ref 39.0–52.0)
Hemoglobin: 13 g/dL (ref 13.0–17.0)
MCH: 29.5 pg (ref 26.0–34.0)
MCHC: 30.7 g/dL (ref 30.0–36.0)
MCV: 96.1 fL (ref 80.0–100.0)
Platelets: 290 10*3/uL (ref 150–400)
RBC: 4.4 MIL/uL (ref 4.22–5.81)
RDW: 14.1 % (ref 11.5–15.5)
WBC: 10.7 10*3/uL — ABNORMAL HIGH (ref 4.0–10.5)
nRBC: 0 % (ref 0.0–0.2)

## 2020-06-29 ENCOUNTER — Other Ambulatory Visit (HOSPITAL_COMMUNITY)
Admission: RE | Admit: 2020-06-29 | Discharge: 2020-06-29 | Disposition: A | Discharge status: Home / Self Care | Payer: Medicare Other | Source: Ambulatory Visit | Attending: Urology | Admitting: Urology

## 2020-06-29 DIAGNOSIS — Z01812 Encounter for preprocedural laboratory examination: Secondary | ICD-10-CM | POA: Diagnosis present

## 2020-06-29 DIAGNOSIS — Z20822 Contact with and (suspected) exposure to covid-19: Secondary | ICD-10-CM | POA: Insufficient documentation

## 2020-06-29 LAB — SARS CORONAVIRUS 2 (TAT 6-24 HRS): SARS Coronavirus 2: NEGATIVE

## 2020-06-29 NOTE — Progress Notes (Signed)
BMP sent to Dr. Alinda Money to review.

## 2020-06-30 NOTE — H&P (Signed)
1. Metastatic prostate cancer  2. Gross hematuria   He presents today after developing the acute onset of painless, gross hematuria. This began about one week after starting Eliquis for atrial fibrillation. He denies dysuria, clots, or difficulty emptying.     ALLERGIES: No Allergies    MEDICATIONS: Asacol Hd 800 mg tablet, delayed release Oral  Atenolol 25 mg tablet Oral  BusPIRone HCl - 15 MG Oral Tablet Oral  Calcium Citrate- Vitamin D 315 mg calcium-6.25 mcg (250 unit) tablet Oral  Eliquis  Humalog Kwikpen U-100 100 unit/ml insulin pen Subcutaneous  Levothyroxine Sodium 200 MCG Oral Tablet Oral  Lisinopril-Hydrochlorothiazide 20 mg-12.5 mg tablet Oral     GU PSH: Radical Prostatectomy - 2013       PSH Notes: Prostatect Retropubic Radical W/ Bilat Pelv Lymphadenectomy, Hernia Repair, Thyroid Surgery Total Thyroidectomy, Intestinal Surgery, Cath Stent Placement, Tonsillectomy   NON-GU PSH: Back Surgery (Unspecified) Hernia Repair - 2013 Remove Thyroid - 2013 Remove Tonsils - 2013 Unlisted Procedure Intestine - 2013     GU PMH: Dysuria - 02/19/2019 Prostate Cancer, Prostate cancer - 2017 Secondary bone metastases, 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 Hessie Diener on 06/29/01. He was seen initially be me as a new patient in January 2012 after he had been lost to followup and found to have a rising PSA of 4.27 in November 2012. His staging evaluation at the time of his initial evaluation by me was felt to likely be indicative of metastatic disease with a 1.8 cm common iliac LN and a probable bone metastasis at the sternum. He began androgen deprivation therapy in April 2013.   Diagnosis: pT2b N0 Mx, Gleason 3+4=7 adenocarcinoma with negative surgical margins  Pretreatment PSA: 4.18 Jun 2001: RRP  Jan 2013: Biochemical recurrence with probable metastatic disease  Apr  2013: Began ADT   2) Testosterone deficiency / bone health: He takes Vitamin D and calcium supplementation for osteoporosis prophylaxis.   Last DXA: Normal (T score -0.7, Jul 2020)     NON-GU PMH: Lung Cancer, History, History of lung cancer - 2017 Secondary and unspecified malignant neoplasm of lymph node, unspecified, Metastasis to lymph nodes - 2017 Anxiety, Anxiety (Symptom) - 2014 Crohns Disease, Crohn's Disease - 2014 Flushing, Flushing - 2014 Gout, Gout - 2014 Diabetes Type 2 Hypertension    FAMILY HISTORY: Father Deceased At Age19 ___ - Runs In Family Mother Deceased At Age 51 from diabetic complicati - Runs In Family Parkinson's Disease - Father   SOCIAL HISTORY: Marital Status: Married Preferred Language: English; Ethnicity: Not Hispanic Or Latino; Race: White Current Smoking Status: Patient does not smoke anymore. Has not smoked since 12/26/1989.  Has never drank.  Drinks 3 caffeinated drinks per day.     Notes: Former smoker, Alcohol Use, Marital History - Currently Married, Occupation:   REVIEW OF SYSTEMS:    GU Review Male:   Patient denies frequent urination, hard to postpone urination, burning/ pain with urination, get up at night to urinate, leakage of urine, stream starts and stops, trouble starting your streams, and have to strain to urinate .  Gastrointestinal (Upper):   Patient denies nausea and vomiting.  Gastrointestinal (Lower):   Patient denies diarrhea and constipation.  Constitutional:   Patient denies fever, night sweats, weight loss, and fatigue.  Skin:   Patient denies skin rash/ lesion and itching.  Eyes:   Patient denies blurred vision and double vision.  Ears/ Nose/ Throat:   Patient denies sore throat and sinus problems.  Hematologic/Lymphatic:   Patient denies swollen glands and easy bruising.  Cardiovascular:   Patient denies leg swelling and chest pains.  Respiratory:   Patient denies cough and shortness of breath.  Endocrine:   Patient  denies excessive thirst.  Musculoskeletal:   Patient denies back pain and joint pain.  Neurological:   Patient denies headaches and dizziness.  Psychologic:   Patient denies anxiety and depression.   VITAL SIGNS:     Weight 189 lb / 85.73 kg  Height 71 in / 180.34 cm  BMI 26.4 kg/m     MULTI-SYSTEM PHYSICAL EXAMINATION:    Constitutional: Well-nourished. No physical deformities. Normally developed. Good grooming.  CV: Irregular Lungs: Clear    Complexity of Data:  Records Review:   Previous Patient Records   12/17/19 06/15/19 12/04/18 05/22/18 11/11/17 04/30/17 10/23/16 04/17/16  PSA  Total PSA <0.015 ng/mL <0.015 ng/mL <0.015 ng/mL <0.015 ng/mL <0.015 ng/mL <0.015 ng/mL < 0.015 ng/dl < 0.015 ng/dl    12/17/19 06/15/19 12/04/18 05/22/18 11/11/17 10/23/16 04/17/16 10/17/15  Hormones  Testosterone, Total <10 ng/dL <10 ng/dL <10 ng/dL <10 ng/dL <10 ng/dL 11.9 pg/dL 20.7 pg/dL 21     PROCEDURES:         Flexible Cystoscopy - 52000  Indication: Gross hematuria Risks, benefits, and potential complications of the procedure were discussed with the patient including infection, bleeding, voiding discomfort, urinary retention, fever, chills, sepsis, and others. All questions were answered. Informed consent was obtained. Sterile technique and intraurethral analgesia were used.  Meatus:  Normal size. Normal location. Normal condition.  Urethra:  No strictures.  External Sphincter:  Normal.  Verumontanum:  Verumontanum Surgically Absent.  Prostate:  Prostate Surgically Absent.  Bladder Neck:  Non-obstructing.  Ureteral Orifices:  Normal location. Normal size. Normal shape. Effluxed clear urine.  Bladder:  No trabeculation. No tumors. Normal mucosa. No stones.      Chaperone: Precious Bard The procedure was well-tolerated and without complications. Instructions were given to call the office immediately if questions or problems.         Urinalysis w/Scope - 81001 Dipstick Dipstick Cont'd  Micro  Color: Yellow Bilirubin: Neg WBC/hpf: NS (Not Seen)  Appearance: Cloudy Ketones: Neg RBC/hpf: >60/hpf  Specific Gravity: 1.025 Blood: 3+ Bacteria: Rare (0-9/hpf)  pH: 6.0 Protein: 1+ Cystals: NS (Not Seen)  Glucose: Neg Urobilinogen: 0.2 Casts: NS (Not Seen)    Nitrites: Neg Trichomonas: Not Present    Leukocyte Esterase: Neg Mucous: Not Present      Epithelial Cells: 0 - 5/hpf      Yeast: NS (Not Seen)      Sperm: Not Present    Notes:  MICRSCOPIC NOT CONCENTRATED     ASSESSMENT:      ICD-10 Details  1 GU:   Gross hematuria - R31.0    PLAN:        1. Gross hematuria: Cystoscopy is unremarkable with no clear etiology for his hematuria. Will proceed with a hematuria protocol CT scan for further evaluation. He will be notified on these results. Renal function to be checked today.   2. Metastatic prostate cancer: Continue ADT. Follow up in February as planned.     I reviewed his CT scan within that demonstrates to distal right ureteral calculi. He is going to get a urine strainer in strain his urine. He has been provided a prescription for pain  medication although currently has not had any symptoms. We discussed options and we have agreed to tentatively plan for ureteroscopic treatment in January if he remains asymptomatic but has not yet passed his stones. If he does pass his stones, he will notify me and we will cancel surgery in reassess with imaging. We reviewed the procedure which will be cystoscopy with right ureteroscopy and laser lithotripsy and right ureteral stent placement. We have reviewed the potential risks of this procedure in the expected recovery process. He gives informed consent to proceed. He will need to stop his Eliquis at least 48 hours prior to surgery.

## 2020-07-03 ENCOUNTER — Encounter (HOSPITAL_COMMUNITY): Payer: Self-pay | Admitting: Urology

## 2020-07-03 ENCOUNTER — Inpatient Hospital Stay (HOSPITAL_COMMUNITY): Payer: Medicare Other

## 2020-07-03 ENCOUNTER — Inpatient Hospital Stay (HOSPITAL_COMMUNITY)
Admission: AC | Admit: 2020-07-03 | Discharge: 2020-07-04 | DRG: 208 | Disposition: A | Discharge status: Home / Self Care | Payer: Medicare Other | Source: Ambulatory Visit | Attending: Internal Medicine | Admitting: Internal Medicine

## 2020-07-03 ENCOUNTER — Ambulatory Visit (HOSPITAL_COMMUNITY): Payer: Medicare Other | Admitting: Certified Registered"

## 2020-07-03 ENCOUNTER — Ambulatory Visit (HOSPITAL_COMMUNITY): Payer: Medicare Other | Admitting: Physician Assistant

## 2020-07-03 ENCOUNTER — Ambulatory Visit (HOSPITAL_COMMUNITY): Payer: Medicare Other

## 2020-07-03 ENCOUNTER — Encounter (HOSPITAL_COMMUNITY)
Admission: AC | Disposition: A | Discharge status: Home / Self Care | Payer: Self-pay | Source: Ambulatory Visit | Attending: Internal Medicine

## 2020-07-03 DIAGNOSIS — K5090 Crohn's disease, unspecified, without complications: Secondary | ICD-10-CM | POA: Diagnosis present

## 2020-07-03 DIAGNOSIS — Z981 Arthrodesis status: Secondary | ICD-10-CM

## 2020-07-03 DIAGNOSIS — R310 Gross hematuria: Secondary | ICD-10-CM | POA: Diagnosis present

## 2020-07-03 DIAGNOSIS — E1122 Type 2 diabetes mellitus with diabetic chronic kidney disease: Secondary | ICD-10-CM | POA: Diagnosis present

## 2020-07-03 DIAGNOSIS — I129 Hypertensive chronic kidney disease with stage 1 through stage 4 chronic kidney disease, or unspecified chronic kidney disease: Secondary | ICD-10-CM | POA: Diagnosis present

## 2020-07-03 DIAGNOSIS — G928 Other toxic encephalopathy: Secondary | ICD-10-CM | POA: Diagnosis present

## 2020-07-03 DIAGNOSIS — J9602 Acute respiratory failure with hypercapnia: Secondary | ICD-10-CM

## 2020-07-03 DIAGNOSIS — Z820 Family history of epilepsy and other diseases of the nervous system: Secondary | ICD-10-CM | POA: Diagnosis not present

## 2020-07-03 DIAGNOSIS — N183 Chronic kidney disease, stage 3 unspecified: Secondary | ICD-10-CM | POA: Diagnosis present

## 2020-07-03 DIAGNOSIS — C775 Secondary and unspecified malignant neoplasm of intrapelvic lymph nodes: Secondary | ICD-10-CM | POA: Diagnosis present

## 2020-07-03 DIAGNOSIS — I480 Paroxysmal atrial fibrillation: Secondary | ICD-10-CM | POA: Diagnosis present

## 2020-07-03 DIAGNOSIS — N201 Calculus of ureter: Secondary | ICD-10-CM | POA: Diagnosis present

## 2020-07-03 DIAGNOSIS — M810 Age-related osteoporosis without current pathological fracture: Secondary | ICD-10-CM | POA: Diagnosis present

## 2020-07-03 DIAGNOSIS — Z85850 Personal history of malignant neoplasm of thyroid: Secondary | ICD-10-CM | POA: Diagnosis not present

## 2020-07-03 DIAGNOSIS — E890 Postprocedural hypothyroidism: Secondary | ICD-10-CM | POA: Diagnosis present

## 2020-07-03 DIAGNOSIS — Z8546 Personal history of malignant neoplasm of prostate: Secondary | ICD-10-CM

## 2020-07-03 DIAGNOSIS — C7951 Secondary malignant neoplasm of bone: Secondary | ICD-10-CM | POA: Diagnosis present

## 2020-07-03 DIAGNOSIS — J95821 Acute postprocedural respiratory failure: Secondary | ICD-10-CM | POA: Diagnosis present

## 2020-07-03 DIAGNOSIS — Z8249 Family history of ischemic heart disease and other diseases of the circulatory system: Secondary | ICD-10-CM

## 2020-07-03 DIAGNOSIS — Z85828 Personal history of other malignant neoplasm of skin: Secondary | ICD-10-CM

## 2020-07-03 DIAGNOSIS — J9621 Acute and chronic respiratory failure with hypoxia: Secondary | ICD-10-CM | POA: Diagnosis present

## 2020-07-03 DIAGNOSIS — R0902 Hypoxemia: Secondary | ICD-10-CM

## 2020-07-03 DIAGNOSIS — N210 Calculus in bladder: Secondary | ICD-10-CM | POA: Diagnosis present

## 2020-07-03 DIAGNOSIS — J9620 Acute and chronic respiratory failure, unspecified whether with hypoxia or hypercapnia: Secondary | ICD-10-CM | POA: Diagnosis present

## 2020-07-03 DIAGNOSIS — Z794 Long term (current) use of insulin: Secondary | ICD-10-CM | POA: Diagnosis not present

## 2020-07-03 DIAGNOSIS — Z87891 Personal history of nicotine dependence: Secondary | ICD-10-CM | POA: Diagnosis not present

## 2020-07-03 DIAGNOSIS — Z85118 Personal history of other malignant neoplasm of bronchus and lung: Secondary | ICD-10-CM

## 2020-07-03 DIAGNOSIS — J9600 Acute respiratory failure, unspecified whether with hypoxia or hypercapnia: Secondary | ICD-10-CM | POA: Diagnosis present

## 2020-07-03 DIAGNOSIS — J9690 Respiratory failure, unspecified, unspecified whether with hypoxia or hypercapnia: Secondary | ICD-10-CM | POA: Diagnosis present

## 2020-07-03 DIAGNOSIS — Z833 Family history of diabetes mellitus: Secondary | ICD-10-CM | POA: Diagnosis not present

## 2020-07-03 DIAGNOSIS — Z01818 Encounter for other preprocedural examination: Secondary | ICD-10-CM

## 2020-07-03 DIAGNOSIS — Z9889 Other specified postprocedural states: Secondary | ICD-10-CM

## 2020-07-03 HISTORY — PX: CYSTOSCOPY/URETEROSCOPY/HOLMIUM LASER/STENT PLACEMENT: SHX6546

## 2020-07-03 LAB — CBC
HCT: 40.6 % (ref 39.0–52.0)
Hemoglobin: 12.9 g/dL — ABNORMAL LOW (ref 13.0–17.0)
MCH: 29.9 pg (ref 26.0–34.0)
MCHC: 31.8 g/dL (ref 30.0–36.0)
MCV: 94 fL (ref 80.0–100.0)
Platelets: 227 10*3/uL (ref 150–400)
RBC: 4.32 MIL/uL (ref 4.22–5.81)
RDW: 14 % (ref 11.5–15.5)
WBC: 11.1 10*3/uL — ABNORMAL HIGH (ref 4.0–10.5)
nRBC: 0 % (ref 0.0–0.2)

## 2020-07-03 LAB — COMPREHENSIVE METABOLIC PANEL
ALT: 26 U/L (ref 0–44)
AST: 42 U/L — ABNORMAL HIGH (ref 15–41)
Albumin: 3.7 g/dL (ref 3.5–5.0)
Alkaline Phosphatase: 71 U/L (ref 38–126)
Anion gap: 14 (ref 5–15)
BUN: 27 mg/dL — ABNORMAL HIGH (ref 8–23)
CO2: 30 mmol/L (ref 22–32)
Calcium: 8.7 mg/dL — ABNORMAL LOW (ref 8.9–10.3)
Chloride: 92 mmol/L — ABNORMAL LOW (ref 98–111)
Creatinine, Ser: 1.53 mg/dL — ABNORMAL HIGH (ref 0.61–1.24)
GFR, Estimated: 47 mL/min — ABNORMAL LOW (ref >60)
Glucose, Bld: 195 mg/dL — ABNORMAL HIGH (ref 70–99)
Potassium: 4.1 mmol/L (ref 3.5–5.1)
Sodium: 136 mmol/L (ref 135–145)
Total Bilirubin: 0.8 mg/dL (ref 0.3–1.2)
Total Protein: 6.7 g/dL (ref 6.5–8.1)

## 2020-07-03 LAB — BLOOD GAS, ARTERIAL
Acid-Base Excess: 1 mmol/L (ref 0.0–2.0)
Acid-Base Excess: 9 mmol/L — ABNORMAL HIGH (ref 0.0–2.0)
Acid-Base Excess: 7.5 mmol/L — ABNORMAL HIGH (ref 0.0–2.0)
Bicarbonate: 37.8 mmol/L — ABNORMAL HIGH (ref 20.0–28.0)
Bicarbonate: 31.5 mmol/L — ABNORMAL HIGH (ref 20.0–28.0)
Bicarbonate: 35.4 mmol/L — ABNORMAL HIGH (ref 20.0–28.0)
Drawn by: 25770
FIO2: 44
FIO2: 40
O2 Content: 6 L/min
O2 Saturation: 96 %
O2 Saturation: 95.1 %
O2 Saturation: 97.1 %
Patient temperature: 94.5
Patient temperature: 98.6
Patient temperature: 98.3
pCO2 arterial: >120 mmHg (ref 32.0–48.0)
pCO2 arterial: 34.5 mmHg (ref 32.0–48.0)
pCO2 arterial: 69.8 mmHg (ref 32.0–48.0)
pH, Arterial: 7.048 — CL (ref 7.350–7.450)
pH, Arterial: 7.567 — ABNORMAL HIGH (ref 7.350–7.450)
pH, Arterial: 7.324 — ABNORMAL LOW (ref 7.350–7.450)
pO2, Arterial: 123 mmHg — ABNORMAL HIGH (ref 83.0–108.0)
pO2, Arterial: 70 mmHg — ABNORMAL LOW (ref 83.0–108.0)
pO2, Arterial: 145 mmHg — ABNORMAL HIGH (ref 83.0–108.0)

## 2020-07-03 LAB — HEMOGLOBIN A1C
Hgb A1c MFr Bld: 6.2 % — ABNORMAL HIGH (ref 4.8–5.6)
Mean Plasma Glucose: 131.24 mg/dL

## 2020-07-03 LAB — GLUCOSE, CAPILLARY
Glucose-Capillary: 82 mg/dL (ref 70–99)
Glucose-Capillary: 97 mg/dL (ref 70–99)

## 2020-07-03 LAB — LACTIC ACID, PLASMA: Lactic Acid, Venous: 1.8 mmol/L (ref 0.5–1.9)

## 2020-07-03 LAB — TSH: TSH: 11.856 u[IU]/mL — ABNORMAL HIGH (ref 0.350–4.500)

## 2020-07-03 LAB — PROCALCITONIN: Procalcitonin: <0.1 ng/mL

## 2020-07-03 LAB — MRSA PCR SCREENING: MRSA by PCR: NEGATIVE

## 2020-07-03 LAB — TROPONIN I (HIGH SENSITIVITY): Troponin I (High Sensitivity): 20 ng/L — ABNORMAL HIGH (ref <18)

## 2020-07-03 LAB — MAGNESIUM: Magnesium: 1.7 mg/dL (ref 1.7–2.4)

## 2020-07-03 LAB — BRAIN NATRIURETIC PEPTIDE: B Natriuretic Peptide: 118.4 pg/mL — ABNORMAL HIGH (ref 0.0–100.0)

## 2020-07-03 LAB — PHOSPHORUS: Phosphorus: 3.3 mg/dL (ref 2.5–4.6)

## 2020-07-03 SURGERY — CYSTOSCOPY/URETEROSCOPY/HOLMIUM LASER/STENT PLACEMENT
Anesthesia: General | Site: Ureter | Laterality: Right

## 2020-07-03 MED ORDER — PROPOFOL 10 MG/ML IV BOLUS
INTRAVENOUS | Status: AC
  Filled 2020-07-03: qty 20

## 2020-07-03 MED ORDER — NALOXONE HCL 0.4 MG/ML IJ SOLN
INTRAMUSCULAR | Status: AC
  Filled 2020-07-03: qty 1

## 2020-07-03 MED ORDER — PROPOFOL 10 MG/ML IV BOLUS
INTRAVENOUS | Status: DC | PRN
  Administered 2020-07-03: 130 mg via INTRAVENOUS

## 2020-07-03 MED ORDER — EPHEDRINE SULFATE-NACL 50-0.9 MG/10ML-% IV SOSY
PREFILLED_SYRINGE | INTRAVENOUS | Status: DC | PRN
  Administered 2020-07-03: 15 mg via INTRAVENOUS

## 2020-07-03 MED ORDER — OXYCODONE HCL 5 MG/5ML PO SOLN: 5.0000 mg | Freq: Once | ORAL | Status: DC | PRN

## 2020-07-03 MED ORDER — NALOXONE HCL 0.4 MG/ML IJ SOLN
0.0400 mg | INTRAMUSCULAR | Status: DC | PRN
  Administered 2020-07-03 (×2): 0.04 mg via INTRAVENOUS

## 2020-07-03 MED ORDER — SODIUM CHLORIDE 0.9 % IR SOLN
Status: DC | PRN
  Administered 2020-07-03: 3000 mL

## 2020-07-03 MED ORDER — CHLORHEXIDINE GLUCONATE CLOTH 2 % EX PADS
6.0000 | MEDICATED_PAD | Freq: Every day | CUTANEOUS | Status: DC
  Administered 2020-07-04: 6 via TOPICAL

## 2020-07-03 MED ORDER — SODIUM CHLORIDE 0.9 % IR SOLN
Status: DC | PRN
  Administered 2020-07-03: 1000 mL

## 2020-07-03 MED ORDER — CEFAZOLIN SODIUM-DEXTROSE 2-4 GM/100ML-% IV SOLN
2.0000 g | Freq: Once | INTRAVENOUS | Status: AC
  Administered 2020-07-03: 2 g via INTRAVENOUS
  Filled 2020-07-03: qty 100

## 2020-07-03 MED ORDER — ONDANSETRON HCL 4 MG/2ML IJ SOLN: 4.0000 mg | Freq: Once | INTRAMUSCULAR | Status: DC | PRN

## 2020-07-03 MED ORDER — PANTOPRAZOLE SODIUM 40 MG IV SOLR
40.0000 mg | Freq: Every day | INTRAVENOUS | Status: DC
  Administered 2020-07-03 – 2020-07-04 (×2): 40 mg via INTRAVENOUS
  Filled 2020-07-03 (×2): qty 40

## 2020-07-03 MED ORDER — OXYCODONE HCL 5 MG PO TABS: 5.0000 mg | ORAL_TABLET | Freq: Once | ORAL | Status: DC | PRN

## 2020-07-03 MED ORDER — ONDANSETRON HCL 4 MG/2ML IJ SOLN
INTRAMUSCULAR | Status: AC
  Filled 2020-07-03: qty 2

## 2020-07-03 MED ORDER — ACETAMINOPHEN 160 MG/5ML PO SOLN
325.0000 mg | ORAL | Status: DC | PRN
Start: 2020-07-03 — End: 2020-07-03

## 2020-07-03 MED ORDER — FENTANYL CITRATE (PF) 100 MCG/2ML IJ SOLN: 25.0000 ug | INTRAMUSCULAR | Status: DC | PRN

## 2020-07-03 MED ORDER — IPRATROPIUM-ALBUTEROL 0.5-2.5 (3) MG/3ML IN SOLN
3.0000 mL | Freq: Four times a day (QID) | RESPIRATORY_TRACT | Status: DC
  Administered 2020-07-03 – 2020-07-04 (×3): 3 mL via RESPIRATORY_TRACT
  Filled 2020-07-03 (×3): qty 3

## 2020-07-03 MED ORDER — ACETAMINOPHEN 325 MG PO TABS: 325.0000 mg | ORAL_TABLET | ORAL | Status: DC | PRN

## 2020-07-03 MED ORDER — IOHEXOL 300 MG/ML  SOLN
INTRAMUSCULAR | Status: DC | PRN
  Administered 2020-07-03: 12 mL

## 2020-07-03 MED ORDER — LIDOCAINE 2% (20 MG/ML) 5 ML SYRINGE
INTRAMUSCULAR | Status: DC | PRN
  Administered 2020-07-03: 70 mg via INTRAVENOUS

## 2020-07-03 MED ORDER — ORAL CARE MOUTH RINSE: 15.0000 mL | Freq: Once | OROMUCOSAL | Status: AC

## 2020-07-03 MED ORDER — ACETAMINOPHEN 10 MG/ML IV SOLN: 1000.0000 mg | Freq: Once | INTRAVENOUS | Status: DC | PRN

## 2020-07-03 MED ORDER — DEXTROSE-NACL 5-0.45 % IV SOLN: INTRAVENOUS | Status: DC

## 2020-07-03 MED ORDER — DEXMEDETOMIDINE HCL IN NACL 200 MCG/50ML IV SOLN
0.4000 ug/kg/h | INTRAVENOUS | Status: DC
  Administered 2020-07-03 (×2): 0.4 ug/kg/h via INTRAVENOUS
  Filled 2020-07-03: qty 50

## 2020-07-03 MED ORDER — MEPERIDINE HCL 50 MG/ML IJ SOLN: 6.2500 mg | INTRAMUSCULAR | Status: DC | PRN

## 2020-07-03 MED ORDER — CHLORHEXIDINE GLUCONATE 0.12 % MT SOLN
15.0000 mL | Freq: Once | OROMUCOSAL | Status: AC
  Administered 2020-07-03: 15 mL via OROMUCOSAL

## 2020-07-03 MED ORDER — LEVOTHYROXINE SODIUM 25 MCG PO TABS
125.0000 ug | ORAL_TABLET | Freq: Every day | ORAL | Status: DC
  Administered 2020-07-04: 125 ug via ORAL
  Filled 2020-07-03: qty 1

## 2020-07-03 MED ORDER — ETOMIDATE 2 MG/ML IV SOLN
20.0000 mg | Freq: Once | INTRAVENOUS | Status: AC
  Administered 2020-07-03: 20 mg via INTRAVENOUS

## 2020-07-03 MED ORDER — LACTATED RINGERS IV SOLN: INTRAVENOUS | Status: DC

## 2020-07-03 MED ORDER — ROCURONIUM BROMIDE 50 MG/5ML IV SOLN
80.0000 mg | Freq: Once | INTRAVENOUS | Status: AC
  Administered 2020-07-03: 80 mg via INTRAVENOUS
  Filled 2020-07-03: qty 8

## 2020-07-03 MED ORDER — ONDANSETRON HCL 4 MG/2ML IJ SOLN
INTRAMUSCULAR | Status: DC | PRN
  Administered 2020-07-03: 4 mg via INTRAVENOUS

## 2020-07-03 MED ORDER — LIDOCAINE HCL (PF) 2 % IJ SOLN
INTRAMUSCULAR | Status: AC
  Filled 2020-07-03: qty 5

## 2020-07-03 MED ORDER — FENTANYL CITRATE (PF) 100 MCG/2ML IJ SOLN
INTRAMUSCULAR | Status: AC
  Filled 2020-07-03: qty 2

## 2020-07-03 MED ORDER — NALOXONE HCL 0.4 MG/ML IJ SOLN: 0.4000 mg | INTRAMUSCULAR | Status: DC | PRN

## 2020-07-03 SURGICAL SUPPLY — 20 items
BAG URO CATCHER STRL LF (MISCELLANEOUS) ×2 IMPLANT
BASKET ZERO TIP NITINOL 2.4FR (BASKET) IMPLANT
BSKT STON RTRVL ZERO TP 2.4FR (BASKET)
CATH INTERMIT  6FR 70CM (CATHETERS) ×2 IMPLANT
CLOTH BEACON ORANGE TIMEOUT ST (SAFETY) ×2 IMPLANT
GLOVE SURG ENC TEXT LTX SZ7.5 (GLOVE) ×2 IMPLANT
GOWN STRL REUS W/TWL LRG LVL3 (GOWN DISPOSABLE) ×2 IMPLANT
GUIDEWIRE STR DUAL SENSOR (WIRE) ×2 IMPLANT
GUIDEWIRE ZIPWRE .038 STRAIGHT (WIRE) IMPLANT
IV NS 1000ML (IV SOLUTION) ×2
IV NS 1000ML BAXH (IV SOLUTION) ×1 IMPLANT
KIT TURNOVER KIT A (KITS) IMPLANT
LASER FIB FLEXIVA PULSE ID 365 (Laser) IMPLANT
MANIFOLD NEPTUNE II (INSTRUMENTS) ×2 IMPLANT
PACK CYSTO (CUSTOM PROCEDURE TRAY) ×2 IMPLANT
SHEATH URETERAL 12FRX35CM (MISCELLANEOUS) IMPLANT
TRACTIP FLEXIVA PULS ID 200XHI (Laser) IMPLANT
TRACTIP FLEXIVA PULSE ID 200 (Laser)
TUBING CONNECTING 10 (TUBING) ×2 IMPLANT
TUBING UROLOGY SET (TUBING) ×2 IMPLANT

## 2020-07-03 NOTE — Progress Notes (Signed)
Per CCM order advance ETT 1 cm- originally intubated @ 23 lip. ETT now resides at 24 cm lip.

## 2020-07-03 NOTE — Progress Notes (Signed)
eLink Physician-Brief Progress Note Patient Name: NAVJOT LOERA DOB: July 26, 1940 MRN: [id number]   Date of Service  07/03/2020  HPI/Events of Note  ABG on BiPAP  = 7.32/69.8/145. Sat = 99% and RR = 18. The patient's pH is acceptable. PMH of L lung adenocacinoma. HCO3 = 35.4. I suspect that his is a chronic pCO2 retainer who lives with a pCO2 in the 60 range.   eICU Interventions  Continue present management.      Intervention Category Major Interventions: Respiratory failure - evaluation and management  Lysle Dingwall 07/03/2020, 9:41 PM

## 2020-07-03 NOTE — Anesthesia Postprocedure Evaluation (Signed)
Anesthesia Post Note  Patient: Devin [name]  Procedure(s) Performed: CYSTOSCOPY/RETROGRADE/URETEROSCOPY REMOVAL OF BLADDER STONE (Right Ureter)     Patient location during evaluation: PACU Anesthesia Type: General Level of consciousness: sedated Pain management: pain level controlled Vital Signs Assessment: post-procedure vital signs reviewed and stable Respiratory status: spontaneous breathing and patient connected to nasal cannula oxygen Cardiovascular status: stable Postop Assessment: no apparent nausea or vomiting Anesthetic complications: no   No complications documented.  Last Vitals:  Vitals:   07/03/20 1053 07/03/20 1100  BP: 116/69 122/68  Pulse: 71 79  Resp: 17 17  Temp: 36.5 C   SpO2: 100% 100%    Last Pain:  Vitals:   07/03/20 1100  TempSrc:   PainSc: Asleep   Pain Goal: Patients Stated Pain Goal: 2 (07/03/20 0915)                 Huston Foley

## 2020-07-03 NOTE — Progress Notes (Signed)
Critical lab value (PCO2) on recent ABG reported to [facility], Kathlee Nations.

## 2020-07-03 NOTE — Progress Notes (Signed)
Patient ID: Devin [name], male   DOB: [birth year], 80 y.o.   MRN: [id number]  I was notified by Dr. Jillyn Hidden that patient remained obtunded in PACU with pinpoint pupils and ABG indicative of respiratory acidosis postoperatively.  Confirmed that he did receive narcotics and patient and wife state he was not taking narcotics recently at home either.  He was administered Narcan regardless without response.  He was initially responsive with no clear complaints per nursing.  Now no longer responsive.  I have contacted Dr. Chase Caller with CCM who will assume care.  Pt is being transferred to ICU currently and likely will need to be re-intubated.  I have called and updated his wife on his situation.

## 2020-07-03 NOTE — Progress Notes (Signed)
Patient transferred from PACU to ICU due to respiratory depression and unresponsiveness. On arrival patient having slow agonal respirations. Intubated on arrival. Wife [name] in surgical waiting room, brought up to the room for updates via PCCM. Patient currently unresponsive but wlll pull back extremities to stimulation.

## 2020-07-03 NOTE — Progress Notes (Signed)
Notified Lab that Stat ABG being sent for analysis.

## 2020-07-03 NOTE — Transfer of Care (Signed)
Immediate Anesthesia Transfer of Care Note  Patient: Devin [name]  Procedure(s) Performed: CYSTOSCOPY/RETROGRADE/URETEROSCOPY REMOVAL OF BLADDER STONE (Right Ureter)  Patient Location: PACU  Anesthesia Type:General  Level of Consciousness: drowsy and patient cooperative  Airway & Oxygen Therapy: Patient Spontanous Breathing and Patient connected to face mask oxygen  Post-op Assessment: Report given to RN and Post -op Vital signs reviewed and stable  Post vital signs: Reviewed and stable  Last Vitals:  Vitals Value Taken Time  BP 116/69 07/03/20 1053  Temp    Pulse 35 07/03/20 1056  Resp 11 07/03/20 1056  SpO2 100 % 07/03/20 1056  Vitals shown include unvalidated device data.  Last Pain:  Vitals:   07/03/20 0915  TempSrc: Oral  PainSc: 0-No pain      Patients Stated Pain Goal: 2 (44/51/46 0479)  Complications: No complications documented.

## 2020-07-03 NOTE — Op Note (Signed)
Preoperative diagnosis: Right ureteral calculus  Postoperative diagnosis: Bladder calculus  Procedure:  1. Cystoscopy 2. Right ureteroscopy 3. Right retrograde pyelography with interpretation 4. Removal of bladder calculus (8 mm)  Surgeon: Roxy Horseman, Brooke Bonito. M.D.  Anesthesia: General  Complications: None  Intraoperative findings: Right retrograde pyelography demonstrated no filling defects in the ureter.  A single stone was noted in the bladder.  EBL: Minimal  Specimens: 1. Bladder calculus  Disposition of specimens: [facility] for stone analysis  Indication: Devin [name] is a 80 y.o. year old patient with urolithiasis who was found to have two distal right ureteral calculi on CT imaging for hematuria despite being asymptomatic. After reviewing the management options for treatment, the patient elected to proceed with the above surgical procedure(s). We have discussed the potential benefits and risks of the procedure, side effects of the proposed treatment, the likelihood of the patient achieving the goals of the procedure, and any potential problems that might occur during the procedure or recuperation. Informed consent has been obtained.  Description of procedure:  The patient was taken to the operating room and general anesthesia was induced.  The patient was placed in the dorsal lithotomy position, prepped and draped in the usual sterile fashion, and preoperative antibiotics were administered. A preoperative time-out was performed.   Cystourethroscopy was performed.  The patient's urethra was examined and was normal. The bladder was then systematically examined in its entirety. There was no evidence for any bladder tumors or other mucosal pathology but a single stone was seen in the bladder.  The ureteral orifice was erythematous consistent with recent passage.  Since he was noted to have had two stones on his imaging and he had not noted passage of any  stones, I did perform retrograde pyelography and then ureteroscopy.    Attention turned to the right ureteral orifice and a ureteral catheter was used to intubate the ureteral orifice.  Omnipaque contrast was injected through the ureteral catheter and a retrograde pyelogram was performed with findings as dictated above.  A 0.38 sensor guidewire was then advanced up the right ureter into the renal pelvis under fluoroscopic guidance. The 6 Fr semirigid ureteroscope was then advanced into the ureter next to the guidewire and no stone was seen in the distal ureter.  I could advance the scope up to the level of the iliac vessels and then injected more contrast with no filling defects noted proximally.  I withdrew the ureteroscope and removed the wire.  The bladder stone was then removed with a rigid grasper.  Reinspection of the bladder revealed no additional stones.  The bladder was then emptied and the procedure ended.  The patient appeared to tolerate the procedure well and without complications.  The patient was able to be awakened and transferred to the recovery unit in satisfactory condition.

## 2020-07-03 NOTE — Anesthesia Procedure Notes (Signed)
Procedure Name: LMA Insertion Date/Time: 07/03/2020 10:20 AM Performed by: Niel Hummer, CRNA Pre-anesthesia Checklist: Patient identified, Emergency Drugs available, Suction available and Patient being monitored Patient Re-evaluated:Patient Re-evaluated prior to induction Oxygen Delivery Method: Circle system utilized Preoxygenation: Pre-oxygenation with 100% oxygen LMA: LMA with gastric port inserted LMA Size: 4.0 Number of attempts: 2 Comments: LMA 4 placed, unable to seat. LMA 4 with gastric port placed successfully. Small lip laceration present. Pressure and vaseline applied.

## 2020-07-03 NOTE — Discharge Instructions (Signed)
1. You may see some blood in the urine and may have some burning with urination for 48-72 hours. You also may notice that you have to urinate more frequently or urgently after your procedure which is normal.  2. You should call should you develop an inability urinate, fever > 101, persistent nausea and vomiting that prevents you from eating or drinking to stay hydrated.        3.   You may resume your Eliquis tomorrow.

## 2020-07-03 NOTE — Anesthesia Preprocedure Evaluation (Addendum)
Anesthesia Evaluation  Patient identified by MRN, date of birth, ID band Patient awake    Reviewed: Allergy & Precautions, NPO status , Patient's Chart, lab work & pertinent test results  History of Anesthesia Complications Negative for: history of anesthetic complications  Airway Mallampati: II  TM Distance: >3 FB Neck ROM: Full    Dental  (+) Teeth Intact, Caps   Pulmonary neg shortness of breath, neg sleep apnea, neg COPD, neg recent URI, former smoker, neg PE   breath sounds clear to auscultation       Cardiovascular hypertension, Pt. on medications (-) angina(-) CHF Normal cardiovascular exam     Neuro/Psych negative neurological ROS  negative psych ROS   GI/Hepatic negative GI ROS, Neg liver ROS,   Endo/Other  diabetes, Type 2, Insulin DependentHypothyroidism   Renal/GU CRFRenal disease  negative genitourinary   Musculoskeletal   Abdominal Normal abdominal exam  (+)   Peds  Hematology  (+) anemia ,   Anesthesia Other Findings   Reproductive/Obstetrics                             Anesthesia Physical  Anesthesia Plan  ASA: III  Anesthesia Plan: General   Post-op Pain Management:    Induction: Intravenous  PONV Risk Score and Plan:   Airway Management Planned: LMA  Additional Equipment: None  Intra-op Plan:   Post-operative Plan: Extubation in OR  Informed Consent: I have reviewed the patients History and Physical, chart, labs and discussed the procedure including the risks, benefits and alternatives for the proposed anesthesia with the patient or authorized representative who has indicated his/her understanding and acceptance.     Dental advisory given  Plan Discussed with: CRNA and Surgeon  Anesthesia Plan Comments:        Anesthesia Quick Evaluation

## 2020-07-03 NOTE — Consult Note (Addendum)
NAME:  Devin [name], MRN:  [id number], DOB:  04/28/1941, LOS: 0 ADMISSION DATE:  07/03/2020, CONSULTATION DATE:  1/24 REFERRING MD:  Alinda Money , CHIEF COMPLAINT:  Acute hypercarbic respiratory failure    Brief History:  80 year old male with widely metastatic prostate cancer Presented for elective cystoscopy right ureteroscopy and ultimately removal of bladder calculus which was 8 mm in diameter. Was provided anesthesia via propofol and LMA extubated in the operating room however was unresponsive and hyper On arrival to the postanesthesia care unit.  Critical care asked to evaluate and assist with postoperative care History of Present Illness:  Diabetes 80 year old male patient with extensive metastatic history.  Presented to the operating room by urology for evaluation of gross hematuria.  He was found to have right ureteral calculi, he underwent cystoscopy and lithotripsy on 1/24 with removal of bladder calculus.  Anesthesia was administered via LMA and he was extubated in the operating room.  On arrival to postanesthesia care unit he was unresponsive, briefly responded to Narcan however developed progressive agonal respiratory efforts arterial blood gas was obtained showed a pH of 7.01, PCO2 of greater than 120 PO2 of 123 critical care asked to evaluate and assist with his care, he was already in route to transfer to the intensive care on my arrival, he was urgently intubated shortly after arrival to the intensive care  Past Medical History:  Metastatic prostate cancer initially diagnosed 2003 Non-small cell lung cancer diagnosed 2017 involving the left lung status post VATS resection Paroxysmal atrial fibrillation Crohn's disease Hypertension Hypothyroidism  Significant Hospital Events:  1/24 status post cystoscopy, right ureteroscopy, right retrograde pyelography with removal of bladder calculus, hypercarbic in PACU, transferred to ICU and emergently intubated Consults:   Procedures:     Significant Diagnostic Tests:    Micro Data:    Antimicrobials:     Interim History / Subjective:  Now sedated on vent   Objective   Blood pressure (Abnormal) 161/72, pulse 91, temperature (Abnormal) 96 F (35.6 C), resp. rate 11, height 5\' 11"  (1.803 m), weight 81.8 kg, SpO2 100 %.        Intake/Output Summary (Last 24 hours) at 07/03/2020 1321 Last data filed at 07/03/2020 1042 Gross per 24 hour  Intake 900 ml  Output no documentation  Net 900 ml   Filed Weights   07/03/20 0915  Weight: 81.8 kg    Examination: General: Elderly 80 year old white male minimally responsive HENT: Normocephalic does have small blister which was present prior to intubation over the upper lip Lungs: Clear diminished bilaterally Cardiovascular: Regular rate, currently sinus Abdomen: Soft not tender Extremities: Warm dry Neuro: Unresponsive on presentation did open his eyes briefly but would not follow commands GU:   Resolved Hospital Problem list     Assessment & Plan:   Right ureteral calculus and bladder calculus status post cystoscopy, right ureteroscopy, right retrograde pyelography and removal of bladder calculus 1/24 Plan Continue gentle hydration Strict intake output Okay to resume anticoagulation  Acute hypercarbic postoperative respiratory failure. -Suspect this is a mix of residual anesthesia effect possibly superimposed on underlying lung disease -Now intubated pcxr ett at thoracic inlet. Basilar atx. Left effusion  Plan Full vent support PAD protocol RASS goal 0 to -1 Will utilize Precedex Very gentle with narcotics VAP bundle Will assess for readiness to wean once mental status improved  Acute toxic metabolic encephalopathy, secondary to postanesthesia sedation and hypercarbia Plan Supportive care Precedex infusion Ventilatory support RASS goal 0 Serial neuro checks  History of hypertension Plan Holding his current atenolol, diltiazem and  hydrochlorothiazide for now  History of PAF Plan Resume atenolol and calcium channel blocker once taking p.o. Continue telemetry monitoring Can resume either in the morning post extubation or via tube if not extubated  CKD stage 3 Plan Cont IVFs   History of hypothyroidism Plan Continue Synthroid supplementation  History of metastatic prostate cancer Plan Follow-up with urology  History of diabetes Plan Sliding scale insulin   Best practice (evaluated daily)  Diet: NPO Pain/Anxiety/Delirium protocol (if indicated): ordered 1/24 VAP protocol (if indicated): 1/24 DVT prophylaxis: scd GI prophylaxis: ppi Glucose control: ssi Mobility: BR Disposition:ICU   Goals of Care:  Last date of multidisciplinary goals of care discussion:pending  Family and staff present: awaiting wife arrival  Summary of discussion: pending  Follow up goals of care discussion due: full code  Code Status: full code   Labs   CBC: Recent Labs  Lab 06/28/20 1331  WBC 10.7*  HGB 13.0  HCT 42.3  MCV 96.1  PLT 387    Basic Metabolic Panel: Recent Labs  Lab 06/28/20 1331  NA 140  K 4.0  CL 96*  CO2 34*  GLUCOSE 160*  BUN 27*  CREATININE 1.66*  CALCIUM 9.4   GFR: Estimated Creatinine Clearance: 39.7 mL/min (A) (by C-G formula based on SCr of 1.66 mg/dL (H)). Recent Labs  Lab 06/28/20 1331  WBC 10.7*    Liver Function Tests: No results for input(s): AST, ALT, ALKPHOS, BILITOT, PROT, ALBUMIN in the last 168 hours. No results for input(s): LIPASE, AMYLASE in the last 168 hours. No results for input(s): AMMONIA in the last 168 hours.  ABG    Component Value Date/Time   PHART 7.048 (LL) 07/03/2020 1222   PCO2ART >120 (HH) 07/03/2020 1222   PO2ART 123 (H) 07/03/2020 1222   HCO3 37.8 (H) 07/03/2020 1222   TCO2 23.0 03/17/2015 0540   ACIDBASEDEF 3.6 (H) 03/17/2015 0540   O2SAT 96.0 07/03/2020 1222     Coagulation Profile: No results for input(s): INR, PROTIME in the last  168 hours.  Cardiac Enzymes: No results for input(s): CKTOTAL, CKMB, CKMBINDEX, TROPONINI in the last 168 hours.  HbA1C: Hgb A1c MFr Bld  Date/Time Value Ref Range Status  01/04/2020 10:43 PM 6.3 (H) 4.8 - 5.6 % Final    Comment:    (NOTE) Pre diabetes:          5.7%-6.4%  Diabetes:              >6.4%  Glycemic control for   <7.0% adults with diabetes   04/29/2016 11:12 AM 6.5 (H) 4.8 - 5.6 % Final    Comment:    (NOTE)         Pre-diabetes: 5.7 - 6.4         Diabetes: >6.4         Glycemic control for adults with diabetes: <7.0     CBG: Recent Labs  Lab 06/28/20 1313 07/03/20 0917 07/03/20 1057  GLUCAP 159* 82 97    Review of Systems:   Not able  Past Medical History:  He,  has a past medical history of A-fib ([place]), Adenocarcinoma of left lung, stage 1 ([place]) (03/10/2015), Ankylosing spondylitis ([place]), Crohn's disease ([place]), Gout, HIstory of basal cell cancer of face, History of kidney stones, Hypertension, Hypocalcemia (04/13/2007), Hypothyroidism, Impotence, 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   Surgical History:   Past Surgical History:  Procedure Laterality Date  . ABDOMINAL EXPLORATION SURGERY     for small bowel obstruction  . APPENDECTOMY  03/2007  . BACK SURGERY    . BASAL CELL CARCINOMA EXCISION  "several"   "head"  . CARDIAC CATHETERIZATION  03/17/2003  . CHOLECYSTECTOMY N/A 05/07/2016   Procedure: LAPAROSCOPIC CHOLECYSTECTOMY;  Surgeon: Fanny Skates, MD;  Location: [facility];  Service: General;  Laterality: N/A;  . COLON SURGERY  03/2007   Resection of cecum, appendix, terminal ileum (approximately/notes 10/10/2010  . HERNIA REPAIR    . LAPAROSCOPIC CHOLECYSTECTOMY  05/07/2016  . LAPAROSCOPIC LYSIS OF ADHESIONS  05/07/2016  . LAPAROSCOPIC LYSIS OF ADHESIONS N/A 05/07/2016    Procedure: LAPAROSCOPIC LYSIS OF ADHESIONS TIMES ONE HOUR;  Surgeon: Fanny Skates, MD;  Location: [facility];  Service: General;  Laterality: N/A;  . POSTERIOR FUSION THORACIC SPINE  02/08/2016   1. Posterior thoracic arthrodesis T7-T11 utilizing morcellized allograft, 2. Posterior thoracic segmental fixation T7-T11 utilizing nuvasive pedicle screws  . PROSTATECTOMY  06/2001   w/bilateral pelvic lymph nose dissection/notes 10/24/2010  . SPINAL FUSION  12/2014   Open reduction internal fixation of L1 Chance fracture with posterior fusion T10-L4 utilizing morcellized allograft and some local autograft, segmental instrumentation T10-L4 inclusive utilizing nuvasive pedicle screws/notes 12/16/2014  . Stress Cardiolite  02/17/2003  . THOROCOTOMY WITH LOBECTOMY  03/16/2015   Procedure: THOROCOTOMY WITH LOBECTOMY;  Surgeon: Ivin Poot, MD;  Location: [facility];  Service: Thoracic;;  . TONSILLECTOMY    . TOTAL THYROIDECTOMY  1997  . Venous Doppler  05/30/2004  . VENTRAL HERNIA REPAIR  04/14/2008  . VIDEO ASSISTED THORACOSCOPY Left 03/16/2015   Procedure: VIDEO ASSISTED THORACOSCOPY;  Surgeon: Ivin Poot, MD;  Location: [facility];  Service: Thoracic;  Laterality: Left;     Social History:   reports that he quit smoking about 30 years ago. His smoking use included cigarettes. He has a 60.00 pack-year smoking history. He has never used smokeless tobacco. He reports previous alcohol use. He reports that he does not use drugs.   Family History:  His family history includes CAD in his mother. There is no history of Cancer.   Allergies No Known Allergies   Home Medications  Prior to Admission medications   Medication Sig Start Date End Date Taking? Authorizing Provider  ALPRAZolam Duanne Moron) 0.5 MG tablet Take 0.5 mg by mouth 2 (two) times daily as needed for anxiety. 02/29/20  Yes [provider]  apixaban (ELIQUIS) 5 MG TABS tablet Take 1 tablet (5 mg total) by mouth 2 (two) times daily. 05/01/20  05/31/20 Yes Fay Records, MD  ASACOL HD 800 MG TBEC Take 1,600 mg by mouth 2 (two) times daily. 11/14/14  Yes [provider]  atenolol (TENORMIN) 25 MG tablet Take 1 tablet (25 mg total) by mouth 2 (two) times daily. 02/09/20  Yes Thurnell Lose, MD  B-D ULTRAFINE III SHORT PEN 31G X 8 MM MISC USE 1 PEN NEEDLE FOUR TIMES DAILY. 09/18/16  Yes English, [name] D, PA  busPIRone (BUSPAR) 15 MG tablet Take 15 mg by mouth 2 (two) times daily.  05/17/16  Yes [provider]  calcium-vitamin D (OSCAL WITH D) 500-200 MG-UNIT tablet Take 2 tablets by mouth daily.    Yes [provider]  diltiazem (CARDIZEM) 30 MG tablet Take 1 tablet every 4 hours AS NEEDED for heart rate >100 as long as blood pressure >100. 03/30/20  Yes Fenton, Clint R, PA  Glucose Blood (ASCENSIA CONTOUR TEST VI) Use as directed to test two times a day   Yes [provider]  glucose blood (BAYER CONTOUR TEST) test strip TEST BLOOD SUGAR TWICE DAILY AS DIRECTED. 05/17/16  Yes Weber, Sarah L, PA-C  glucose blood test strip 1 each by Other route 2 times daily. 05/17/16  Yes [provider]  hydrochlorothiazide (HYDRODIURIL) 12.5 MG tablet Take 12.5 mg by mouth every morning. 06/06/20  Yes [provider]  insulin lispro (HUMALOG KWIKPEN) 100 UNIT/ML KiwkPen INJECT UNDER THE SKIN THREE TIMES DAILY( 25 UNITS, 25 UNITS AND 20 UNITS RESPECTIVELY BEFORE MEALS) Patient taking differently: Inject 20-30 Units into the skin See admin instructions. INJECT UNDER THE SKIN THREE TIMES DAILY( 25 units in the morning, 20 units in the afternoon, 30 units in the evening per patient. 05/17/16  Yes Weber, Sarah L, PA-C  Insulin Pen Needle 31G X 8 MM MISC Inject 31 g into the skin. 09/18/16  Yes [provider]  levothyroxine (SYNTHROID) 125 MCG tablet Take 1 tablet (125 mcg total) by mouth daily before breakfast. 02/10/20  Yes Thurnell Lose, MD  Multiple Vitamins-Iron (DAILY-VITE/IRON/BETA-CAROTENE)  TABS Take 1 tablet by mouth daily.    Yes [provider]  zinc gluconate 50 MG tablet Take 50 mg by mouth daily.   Yes [provider]  leuprolide (LUPRON) 11.25 MG injection Inject 11.25 mg into the muscle every 6 (six) months.     [provider]     Critical care time: 33 min     Erick Colace ACNP-BC [place] Pager # [phone] OR # [phone] if no answer

## 2020-07-04 ENCOUNTER — Encounter (HOSPITAL_COMMUNITY): Payer: Self-pay | Admitting: Urology

## 2020-07-04 ENCOUNTER — Inpatient Hospital Stay (HOSPITAL_COMMUNITY): Payer: Medicare Other

## 2020-07-04 LAB — BASIC METABOLIC PANEL
Anion gap: 11 (ref 5–15)
BUN: 23 mg/dL (ref 8–23)
CO2: 31 mmol/L (ref 22–32)
Calcium: 8.2 mg/dL — ABNORMAL LOW (ref 8.9–10.3)
Chloride: 96 mmol/L — ABNORMAL LOW (ref 98–111)
Creatinine, Ser: 1.45 mg/dL — ABNORMAL HIGH (ref 0.61–1.24)
GFR, Estimated: 50 mL/min — ABNORMAL LOW (ref >60)
Glucose, Bld: 121 mg/dL — ABNORMAL HIGH (ref 70–99)
Potassium: 3.5 mmol/L (ref 3.5–5.1)
Sodium: 138 mmol/L (ref 135–145)

## 2020-07-04 NOTE — Procedures (Signed)
  Intubation Procedure Note  Devin [name]  [id number]  [birth year]  Date:07/04/20  Time:11:28 AM   Provider Performing:Pete E Kary Kos    Procedure: Intubation (52080)  Indication(s) Respiratory Failure  Consent Unable to obtain consent due to emergent nature of procedure.   Anesthesia Etomidate and Rocuronium   Time Out Verified patient identification, verified procedure, site/side was marked, verified correct patient position, special equipment/implants available, medications/allergies/relevant history reviewed, required imaging and test results available.   Sterile Technique Usual hand hygeine, masks, and gloves were used   Procedure Description Patient positioned in bed supine.  Sedation given as noted above.  Patient was intubated with endotracheal tube using Glidescope.  View was Grade 1 full glottis .  Number of attempts was 1.  Colorimetric CO2 detector was consistent with tracheal placement.   Complications/Tolerance None; patient tolerated the procedure well. Chest X-ray is ordered to verify placement.   EBL Minimal   Specimen(s) None  Erick Colace ACNP-BC [place] Pager # [phone] OR # [phone] if no answer

## 2020-07-04 NOTE — TOC Initial Note (Signed)
Transition of Care [facility]) - Initial/Assessment Note    Patient Details  Name: Devin [name] MRN: [id number] Date of Birth: 1940-12-08  [facility] CM/SW Contact:    Leeroy Cha, RN Phone Number: 07/04/2020, 8:19 AM  Clinical Narrative:                 PATIENT EXTUBATED IN OR FAILED TRIAL AND WAS RE INTUBATED IN ICU .  O2 REQUIREMENTS IMPROVED AN PATIENT WAS EXTUBATED TO BIPAP AND NOW IS ON O2 AT 40%./MIN. STATUS POST SYTO. PLAN;RETURN TO HOME WITH WIFE FOLLOWING FOR PROGRESSION.  Expected Discharge Plan: Home/Self Care Barriers to Discharge: Continued Medical Work up   Patient Goals and CMS Choice Patient states their goals for this hospitalization and ongoing recovery are:: TO [place] CMS Medicare.gov Compare Post Acute Care list provided to:: Patient    Expected Discharge Plan and Services Expected Discharge Plan: Home/Self Care       Living arrangements for the past 2 months: Single Family Home Expected Discharge Date:  (unknown)                                    Prior Living Arrangements/Services Living arrangements for the past 2 months: Single Family Home Lives with:: Spouse Patient language and need for interpreter reviewed:: Yes Do you feel safe going back to the place where you live?: Yes      Need for Family Participation in Patient Care: Yes (Comment) Care giver support system in place?: Yes (comment)   Criminal Activity/Legal Involvement Pertinent to Current Situation/Hospitalization: No - Comment as needed  Activities of Daily Living Home Assistive Devices/Equipment: Cane (specify quad or straight),Built-in shower seat,Walker (specify type),Eyeglasses,Hand-held shower hose,Blood pressure cuff,CBG Meter,Other (Comment) (pulse oximeter, front wheeled walker) ADL Screening (condition at time of admission) Patient's cognitive ability adequate to safely complete daily activities?: No (patient currently intubated) Is the patient  deaf or have difficulty hearing?: No Does the patient have difficulty seeing, even when wearing glasses/contacts?: No Does the patient have difficulty concentrating, remembering, or making decisions?: Yes (patient currently intubated) Patient able to express need for assistance with ADLs?: No (patient currently intubated) Does the patient have difficulty dressing or bathing?: Yes Independently performs ADLs?: No Communication: Dependent (patient currently intubated) Is this a change from baseline?: Change from baseline, expected to last >3 days Dressing (OT): Dependent Is this a change from baseline?: Change from baseline, expected to last >3 days Grooming: Dependent Is this a change from baseline?: Change from baseline, expected to last >3 days Feeding: Dependent Is this a change from baseline?: Change from baseline, expected to last >3 days Bathing: Dependent Is this a change from baseline?: Change from baseline, expected to last >3 days Toileting: Dependent Is this a change from baseline?: Change from baseline, expected to last >3days In/Out Bed: Dependent Is this a change from baseline?: Change from baseline, expected to last >3 days Walks in Home: Dependent Is this a change from baseline?: Change from baseline, expected to last >3 days Does the patient have difficulty walking or climbing stairs?: Yes (secondary to weakness) Weakness of Legs: Both Weakness of Arms/Hands: Both  Permission Sought/Granted                  Emotional Assessment Appearance:: Appears stated age Attitude/Demeanor/Rapport: Engaged Affect (typically observed): Calm Orientation: : Oriented to Self,Oriented to Place,Oriented to  Time,Oriented to Situation Alcohol / Substance Use:  Not Applicable Psych Involvement: No (comment)  Admission diagnosis:  S/P cystoscopy [Z98.890] Respiratory failure (HCC) [J96.90] Acute respiratory failure ([place]) [J96.00] Patient Active Problem List   Diagnosis Date  Noted  . S/P cystoscopy 07/03/2020  . Respiratory failure ([place]) 07/03/2020  . Acute respiratory failure ([place]) 07/03/2020  . Paroxysmal atrial fibrillation ([place]) 03/30/2020  . Secondary hypercoagulable state ([place]) 03/30/2020  . Orthostatic hypotension 02/07/2020  . Dyspnea 01/17/2020  . NSTEMI (non-ST elevated myocardial infarction) ([place]) 01/04/2020  . 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 Hyperlipidemia 02/21/2017  . Hyperuricemia 02/21/2017  . 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 Thyroid cancer ([place]) 02/21/2017  . Cholecystitis with cholelithiasis 05/07/2016  . Cholecystitis 03/14/2016  . Abdominal pain 03/05/2016  . Malnutrition of moderate degree 02/10/2016  . Thoracic compression fracture ([place]) 02/08/2016  . Back pain 01/26/2016  . Hip pain 01/26/2016  . S/P lobectomy of lung 03/16/2015  . Adenocarcinoma of left lung, stage 1 ([place]) 03/10/2015  . Diabetic neuropathy ([place])   . Ankylosing spondylitis ([place])   . Obesity (BMI 30-39.9)   . Crohn's disease ([place])   . Lung nodule 02/23/2015  . HIstory of basal cell cancer of face   . History of thyroid cancer   . Postsurgical hypothyroidism   . Gout 12/29/2006  . Essential hypertension 12/29/2006  . Osteoporosis 12/29/2006  . DM (diabetes mellitus), type 2 with renal complications ([place])   . Prostate cancer with recurrence    PCP:  Prince Solian, MD Pharmacy:   [facility] [place], [address] AT [address] [place] [address] Phone: [phone] Fax: [phone]     Social Determinants of Health (SDOH) Interventions    Readmission Risk Interventions No flowsheet data found.

## 2020-07-04 NOTE — Discharge Summary (Signed)
Physician Discharge Summary         Patient ID: Devin [name] MRN: [id number] DOB/AGE: Jun 23, 1940 80 y.o.  Admit date: 07/03/2020 Discharge date: 07/04/2020  Discharge Diagnoses:    Right ureteral calculus and bladder calculus status post cystoscopy, right ureteroscopy, right retrograde pyelography and removal of bladder calculus 1/24 Acute hypercarbic postoperative respiratory failure Acute toxic/metabolic encephalopathy History of hypertension History of paroxysmal atrial fibrillation CKD stage III History of hypothyroidism History of metastatic prostate cancer History of diabetes   Discharge summary    This is a 80 year old male patient with metastatic prostate cancer who presented for elective cystoscopy, right ureteroscopy, and ultimately removal of bladder calculus by urology on 1/24 all triggered by initial chief complaint of painless hematuria.  The procedure was unremarkable, anesthesia was provided via propofol and LMA he was extubated in the operating room however upon arrival to the postanesthesia care unit was obtunded with progressive agonal respiratory efforts.  Narcan was attempted however unsuccessful and because of this critical care was consulted.  He was transferred emergently to the intensive care, emergently intubated, and placed on mechanical ventilator with Precedex infusion.  Over the next couple hours he slowly became more awake, subsequently underwent spontaneous breathing trial off from Precedex infusion, successfully passed this and was extubated on BiPAP later that afternoon.  He was monitored overnight, oxygen removed, was provided with gentle IV hydration, prior to discharge pulse oximetry noted to stay above 90% on room air, eating and drinking without difficulty, deemed stable for discharge with follow-up with urology which was already arranged.  Discharge Plan by Active Problems    Right ureteral calculus and bladder calculus status post  cystoscopy, right ureteroscopy, right retrograde pyelography and removal of bladder calculus 1/24 for painless hematuria Plan F/u with Urology   History of metastatic prostate cancer Plan Follow-up with urology  History of hypertension Plan Resume atenolol diltiazem and hydrochlorothiazide  History of paroxysmal atrial fibrillation Plan Resuming home medications including Eliquis  CKD Plan Follow-up outpatient  History of hypothyroidism Plan Home on Synthroid.    Significant Hospital tests/ studies   Procedures   Right ureteral calculus and bladder calculus status post cystoscopy, right ureteroscopy, right retrograde pyelography and removal of bladder calculus 1/24 Emergent intubation 1/24, extubated later that evening Culture data/antimicrobials      Consults  Critical care    Discharge Exam: Blood Pressure 122/76 (BP Location: Right Arm)   Pulse 87   Temperature 98 F (36.7 C) (Oral)   Respiration (Abnormal) 24   Height 5\' 11"  (1.803 m)   Weight 81.8 kg   Oxygen Saturation 100%   Body Mass Index 25.16 kg/m   General 80 year old white male resting in bed currently in no acute distress HEENT normocephalic atraumatic  does have small ulceration on the top of his lip.  This was present on day of admission Pulmonary: Clear to auscultation currently room air saturations in the mid 90s Cardiac: Regular rate and rhythm Abdomen: Soft nontender Extremities: Warm dry with brisk capillary refill Neuro: Awake oriented no focal deficits appreciated GU clear yellow. Labs at discharge   Lab Results  Component Value Date   CREATININE 1.45 (H) 07/04/2020   BUN 23 07/04/2020   NA 138 07/04/2020   K 3.5 07/04/2020   CL 96 (L) 07/04/2020   CO2 31 07/04/2020   Lab Results  Component Value Date   WBC 11.1 (H) 07/03/2020   HGB 12.9 (L) 07/03/2020   HCT 40.6 07/03/2020   MCV 94.0  07/03/2020   PLT 227 07/03/2020   Lab Results  Component Value Date   ALT 26  07/03/2020   AST 42 (H) 07/03/2020   ALKPHOS 71 07/03/2020   BILITOT 0.8 07/03/2020   Lab Results  Component Value Date   INR 1.01 02/07/2016   INR 1.07 03/14/2015   INR 1.08 02/16/2015    Current radiological studies    DG Chest 1 View  Result Date: 07/03/2020 CLINICAL DATA:  Intubation EXAM: CHEST  1 VIEW COMPARISON:  07/03/2020 FINDINGS: Endotracheal tube in good position and unchanged from the prior study. Left lower lobe atelectasis and small left effusion unchanged. Mild right lower lobe atelectasis unchanged. Negative for edema. Scoliosis and multilevel pedicle screw and rod fusion. Both rods appear fractured in the lower thoracic spine region. IMPRESSION: Endotracheal tube in good position. Left lower lobe atelectasis and effusion unchanged. Electronically Signed   By: Franchot Gallo M.D.   On: 07/03/2020 14:07   DG Chest 1 View  Result Date: 07/03/2020 CLINICAL DATA:  Intubation EXAM: CHEST  1 VIEW COMPARISON:  Portable exam 1327 hours compared to 03/26/2020 FINDINGS: Tip of endotracheal tube projects 10.9 cm above carina. Normal heart size, mediastinal contours, and pulmonary vascularity. Bibasilar atelectasis and question small LEFT pleural effusion. Remaining lungs clear. No pneumothorax. Osseous demineralization with prior spinal fixation and fracture of the spinal rods bilaterally at T11-T12. IMPRESSION: Bibasilar atelectasis and question small LEFT pleural effusion. Electronically Signed   By: Lavonia Dana M.D.   On: 07/03/2020 13:37   DG C-Arm 1-60 Min-No Report  Result Date: 07/03/2020 Fluoroscopy was utilized by the requesting physician.  No radiographic interpretation.    Disposition:    Discharge disposition: 01-Home or Self Care       Discharge Instructions    Diet - low sodium heart healthy   Complete by: As directed    Increase activity slowly   Complete by: As directed       Allergies as of 07/04/2020   No Known Allergies     Medication List     Take these medications   ALPRAZolam 0.5 MG tablet Commonly known as: XANAX Take 0.5 mg by mouth 2 (two) times daily as needed for anxiety.   apixaban 5 MG Tabs tablet Commonly known as: ELIQUIS Take 1 tablet (5 mg total) by mouth 2 (two) times daily.   Asacol HD 800 MG Tbec Generic drug: Mesalamine Take 1,600 mg by mouth 2 (two) times daily.   ASCENSIA CONTOUR TEST VI Use as directed to test two times a day   glucose blood test strip Commonly known as: Estate manager/land agent TEST BLOOD SUGAR TWICE DAILY AS DIRECTED.   glucose blood test strip 1 each by Other route 2 times daily.   atenolol 25 MG tablet Commonly known as: TENORMIN Take 1 tablet (25 mg total) by mouth 2 (two) times daily.   B-D ULTRAFINE III SHORT PEN 31G X 8 MM Misc Generic drug: Insulin Pen Needle USE 1 PEN NEEDLE FOUR TIMES DAILY.   Insulin Pen Needle 31G X 8 MM Misc Inject 31 g into the skin.   busPIRone 15 MG tablet Commonly known as: BUSPAR Take 15 mg by mouth 2 (two) times daily.   calcium-vitamin D 500-200 MG-UNIT tablet Commonly known as: OSCAL WITH D Take 2 tablets by mouth daily.   Daily-Vite/Iron/Beta-Carotene Tabs Take 1 tablet by mouth daily.   diltiazem 30 MG tablet Commonly known as: Cardizem Take 1 tablet every 4 hours AS NEEDED for  heart rate >100 as long as blood pressure >100.   hydrochlorothiazide 12.5 MG tablet Commonly known as: HYDRODIURIL Take 12.5 mg by mouth every morning.   insulin lispro 100 UNIT/ML KiwkPen Commonly known as: HumaLOG KwikPen INJECT UNDER THE SKIN THREE TIMES DAILY( 25 UNITS, 25 UNITS AND 20 UNITS RESPECTIVELY BEFORE MEALS) What changed:   how much to take  how to take this  when to take this  additional instructions   leuprolide 11.25 MG injection Commonly known as: LUPRON Inject 11.25 mg into the muscle every 6 (six) months.   levothyroxine 125 MCG tablet Commonly known as: SYNTHROID Take 1 tablet (125 mcg total) by mouth daily before  breakfast.   zinc gluconate 50 MG tablet Take 50 mg by mouth daily.        Follow-up appointment   Dr Anselm Lis  Discharge Condition:    good  Physician Statement:   The Patient was personally examined, the discharge assessment and plan has been personally reviewed and I agree with ACNP Nello Corro's assessment and plan. 32  minutes of time have been dedicated to discharge assessment, planning and discharge instructions.   Signed: Clementeen Graham 07/04/2020, 9:47 AM

## 2020-07-07 LAB — ACETYLCHOLINE RECEPTOR AB, ALL
Acety choline binding ab: <0.03 nmol/L (ref 0.00–0.24)
Acetylchol Block Ab: 32 % — ABNORMAL HIGH (ref 0–25)

## 2020-07-19 ENCOUNTER — Ambulatory Visit: Payer: Medicare Other | Admitting: Internal Medicine

## 2020-07-19 ENCOUNTER — Encounter: Payer: Self-pay | Admitting: Internal Medicine

## 2020-07-19 ENCOUNTER — Other Ambulatory Visit: Payer: Self-pay

## 2020-07-19 VITALS — BP 122/76 | HR 70 | Temp 97.3°F | Ht 69.0 in | Wt 184.0 lb

## 2020-07-19 DIAGNOSIS — J95821 Acute postprocedural respiratory failure: Secondary | ICD-10-CM | POA: Diagnosis not present

## 2020-07-19 DIAGNOSIS — R911 Solitary pulmonary nodule: Secondary | ICD-10-CM | POA: Diagnosis not present

## 2020-07-19 DIAGNOSIS — Z85118 Personal history of other malignant neoplasm of bronchus and lung: Secondary | ICD-10-CM

## 2020-07-19 DIAGNOSIS — Z902 Acquired absence of lung [part of]: Secondary | ICD-10-CM | POA: Diagnosis not present

## 2020-07-19 NOTE — Progress Notes (Signed)
@Patient  ID: Devin [name], male    DOB: 06-20-1940, 80 y.o.   MRN: [id number]  Chief Complaint  Patient presents with  . Consult    Referring provider: Prince Solian, MD  HPI:    01/17/2020 Pulmonary consult : Lung nodule  Patient presents for a pulmonary consult for lung nodule recently seen on CT scan during hospitalization last month.  Patient was recently admitted to the hospital July 27 through January 06, 2020 for shortness of breath and chest pain.  Found to have a non-STEMI due to hypertensive urgency and respiratory distress.  Patient was seen by cardiology and had a nuclear perfusion stress test that was considered low risk.  Echo showed preserved EF grade 1 diastolic dysfunction.  And normal pulmonary artery systolic pressure.  Inflammatory markers with see reactive protein and sed rate were normal. CT angio chest was negative for PE showed stable left upper lobectomy postop changes.  Stable 6 mm pulmonary nodule in the left lower lobe.  And a unchanged loculated left pleural effusion.  Patient says he was diagnosed with pneumonia few months ago and was treated however continued to have shortness of breath over the last 3 to 4 months.  Has been very sedentary with little activity.  And has had a slow weight gain of about 60 pounds over the last 5 years.  He is retired from [employer].  Denies any known exposure to chronic asbestos.  He does have Crohn's disease on Asacol .  He has a basement that is completely finished with a dehumidifier and no known mold.  Denies any birds or chickens.  No significant travel.  Says it whenever he tries to walk any mild dizziness does get some shortness of breath.  He denies any leg swelling orthopnea or chest pain.  He is a former smoker.  Denies any previous diagnosis of COPD or asthma.  Denies any cough or wheezing.  Patient did have pulmonary function testing in 2018 that showed moderate restriction with an FEV1 at 72%, ratio 78, FVC 67%,  DLCO 57%.   80 year old male former smoker presents for pulmonary consult for lung nodule.  Patient has a history of stage Ia non-small cell lung cancer, adenocarcinoma diagnosed in 2016 status post left VATS with left upper lobectomy and mediastinal lymph node dissection with Dr. Darcey Nora in October 2016, remains on surveillance with Dr. Earlie Server with yearly CT scans Medical history significant for type 1 diabetes/insulin-dependent   TEST/EVENTS :  CT chest September 06, 2019 postop changes in the left upper lobe no evidence of malignant recurrence, stable benign 4 to 5 mm pulmonary nodule in the superior segment of the left lower lobe.  Unchanged small loculated left pleural effusion      OV 07/19/2020  Subjective:  Patient ID: Devin [name], male , DOB: 1940/08/04 , age 23 y.o. , MRN: [id number] , ADDRESS: [address] [place] [id number] PCP Prince Solian, MD Patient Care Team: Prince Solian, MD as PCP - General (Internal Medicine) Sanda Klein, MD as PCP - Cardiology (Cardiology) Raynelle Bring, MD as Attending Physician (Urology) Richmond Campbell, MD as Attending Physician (Gastroenterology) Jacolyn Reedy, MD as Attending Physician (Cardiology) Monna Fam, MD as Consulting Physician (Ophthalmology) Curt Bears, MD as Consulting Physician (Oncology)  This Provider for this visit: Treatment Team:  Attending Provider: Brand Males, MD    07/19/2020 -   Chief Complaint  Patient presents with  . Follow-up    Hospital on 07/03/20. No complaints  ost hospital follow-up for postoperative respiratory failure -January 2022 History of lobectomy for lung cancer in 2016 -surveillance by Dr. Julien Nordmann 6 mm left lower lobe lung nodule -July 2021  HPI Devin [name] 80 y.o. -was seen in the end of January 2022 at [facility], ICU post urologic surgery where he required reintubation.  Blood gas at that time showed hypercapnia.  Acetylcholine receptor  antibody was normal.  He was discharged in short order.  He presents for follow-up.  He tells me that he has been stable.  He says he was surprised by the postoperative respiratory failure.  He says he has had previous extensive back surgery prostate surgery colonoscopy all without any problems.  However he says that all this was before his lung cancer lobectomy in 2016.  At this point in time no further surgeries planned.  He is not having any shortness of breath or cough or wheezing.  Just mild shortness of breath with exertion such as climbing 1 or 2 flights of stairs.  No problems changing clothes.  No problems with activities of daily living no orthopnea no proximal nocturnal dyspnea.  No hemoptysis.   His PFTs do show a decline after his lobectomy and is documented below.  But his last PFT was over 3 years ago.  Simple office walk 185 feet x  3 laps goal with forehead probe 07/19/2020   O2 used ra  Number laps completed 1 lap  Comments about pace normal  Resting Pulse Ox/HR 97% and 68/min  Final Pulse Ox/HR 86% and 81/min  Desaturated </= 88% ys  Desaturated <= 3% points ye  Got Tachycardic >/= 90/min no  Symptoms at end of test none  Miscellaneous comments Refused o2        No flowsheet data found. Results for TINY, RIETZ" (MRN [id number]) as of 07/19/2020 09:39  Ref. Range 07/03/2020 16:38  Acety choline binding ab Latest Ref Range: 0.00 - 0.24 nmol/L <0.03    Results for AUBERT, CHOYCE" (MRN [id number]) as of 07/19/2020 09:39  Ref. Range 07/03/2020 21:00  FIO2 Unknown 40.00  pH, Arterial Latest Ref Range: 7.350 - 7.450  7.324 (L)  pCO2 arterial Latest Ref Range: 32.0 - 48.0 mmHg 69.8 (HH)  pO2, Arterial Latest Ref Range: 83.0 - 108.0 mmHg 145 (H)    PFT  PFT Results Latest Ref Rng & Units 07/18/2016 03/08/2015  FVC-Pre L 2.68 2.96  FVC-Predicted Pre % 67 73  FVC-Post L 2.57 3.10  FVC-Predicted Post % 64 77  Pre FEV1/FVC % % 78 81  Post FEV1/FCV % % 76  83  FEV1-Pre L 2.09 2.39  FEV1-Predicted Pre % 72 81  FEV1-Post L 1.95 2.57  DLCO uncorrected ml/min/mmHg 17.16 26.22  DLCO UNC% % 57 88  DLCO corrected ml/min/mmHg 17.62 -  DLCO COR %Predicted % 59 -  DLVA Predicted % 96 110  TLC L 5.08 4.81  TLC % Predicted % 76 72  RV % Predicted % 106 76       has a past medical history of A-fib ([place]), Adenocarcinoma of left lung, stage 1 ([place]) (03/10/2015), Ankylosing spondylitis ([place]), Crohn's disease ([place]), Gout, HIstory of basal cell cancer of face, History of kidney stones, Hypertension, Hypocalcemia (04/13/2007), Hypothyroidism, Impotence, 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   reports that he quit smoking about 30 years ago. His smoking use included  cigarettes. He has a 60.00 pack-year smoking history. He has never used smokeless tobacco.  Past Surgical History:  Procedure Laterality Date  . ABDOMINAL EXPLORATION SURGERY     for small bowel obstruction  . APPENDECTOMY  03/2007  . BACK SURGERY    . BASAL CELL CARCINOMA EXCISION  "several"   "head"  . CARDIAC CATHETERIZATION  03/17/2003  . CHOLECYSTECTOMY N/A 05/07/2016   Procedure: LAPAROSCOPIC CHOLECYSTECTOMY;  Surgeon: Fanny Skates, MD;  Location: [facility];  Service: General;  Laterality: N/A;  . COLON SURGERY  03/2007   Resection of cecum, appendix, terminal ileum (approximately/notes 10/10/2010  . CYSTOSCOPY/URETEROSCOPY/HOLMIUM LASER/STENT PLACEMENT Right 07/03/2020   Procedure: CYSTOSCOPY/RETROGRADE/URETEROSCOPY REMOVAL OF BLADDER STONE;  Surgeon: Raynelle Bring, MD;  Location: [facility];  Service: Urology;  Laterality: Right;  . HERNIA REPAIR    . LAPAROSCOPIC CHOLECYSTECTOMY  05/07/2016  . LAPAROSCOPIC LYSIS OF ADHESIONS  05/07/2016  . LAPAROSCOPIC LYSIS OF ADHESIONS N/A 05/07/2016   Procedure: LAPAROSCOPIC LYSIS OF  ADHESIONS TIMES ONE HOUR;  Surgeon: Fanny Skates, MD;  Location: [facility];  Service: General;  Laterality: N/A;  . POSTERIOR FUSION THORACIC SPINE  02/08/2016   1. Posterior thoracic arthrodesis T7-T11 utilizing morcellized allograft, 2. Posterior thoracic segmental fixation T7-T11 utilizing nuvasive pedicle screws  . PROSTATECTOMY  06/2001   w/bilateral pelvic lymph nose dissection/notes 10/24/2010  . SPINAL FUSION  12/2014   Open reduction internal fixation of L1 Chance fracture with posterior fusion T10-L4 utilizing morcellized allograft and some local autograft, segmental instrumentation T10-L4 inclusive utilizing nuvasive pedicle screws/notes 12/16/2014  . Stress Cardiolite  02/17/2003  . THOROCOTOMY WITH LOBECTOMY  03/16/2015   Procedure: THOROCOTOMY WITH LOBECTOMY;  Surgeon: Ivin Poot, MD;  Location: [facility];  Service: Thoracic;;  . TONSILLECTOMY    . TOTAL THYROIDECTOMY  1997  . Venous Doppler  05/30/2004  . VENTRAL HERNIA REPAIR  04/14/2008  . VIDEO ASSISTED THORACOSCOPY Left 03/16/2015   Procedure: VIDEO ASSISTED THORACOSCOPY;  Surgeon: Ivin Poot, MD;  Location: [facility];  Service: Thoracic;  Laterality: Left;    No Known Allergies  Immunization History  Administered Date(s) Administered  . Influenza, High Dose Seasonal PF 05/08/2015, 03/05/2016, 02/26/2017, 02/23/2018  . Influenza, Quadrivalent, Recombinant, Inj, Pf 03/11/2019, 02/24/2020  . Moderna Sars-Covid-2 Vaccination 06/19/2019, 07/25/2019, 08/08/2019  . Pneumococcal Conjugate-13 05/03/2014  . Pneumococcal Polysaccharide-23 12/09/1995, 04/18/2009  . Td 06/10/2002  . Tdap 05/03/2014  . Zoster 05/02/2009    Family History  Problem Relation Age of Onset  . CAD Mother   . Cancer Neg Hx        No cancer in the patient's immediate family, except of course for the patient himself, as noted     Current Outpatient Medications:  .  ALPRAZolam (XANAX) 0.5 MG tablet, Take 0.5 mg by mouth 2 (two) times daily as needed for  anxiety., Disp: , Rfl:  .  ASACOL HD 800 MG TBEC, Take 1,600 mg by mouth 2 (two) times daily., Disp: , Rfl: 3 .  atenolol (TENORMIN) 25 MG tablet, Take 1 tablet (25 mg total) by mouth 2 (two) times daily., Disp: , Rfl:  .  B-D ULTRAFINE III SHORT PEN 31G X 8 MM MISC, USE 1 PEN NEEDLE FOUR TIMES DAILY., Disp: 100 each, Rfl: 0 .  busPIRone (BUSPAR) 15 MG tablet, Take 15 mg by mouth 2 (two) times daily. , Disp: , Rfl:  .  calcium-vitamin D (OSCAL WITH D) 500-200 MG-UNIT tablet, Take 2 tablets by mouth daily. , Disp: , Rfl:  .  diltiazem (CARDIZEM) 30 MG tablet, Take 1 tablet every 4 hours AS NEEDED for heart rate >100 as long as blood pressure >100., Disp: 45 tablet, Rfl: 1 .  Glucose Blood (ASCENSIA CONTOUR TEST VI), Use as directed to test two times a day, Disp: , Rfl:  .  glucose blood (BAYER CONTOUR TEST) test strip, TEST BLOOD SUGAR TWICE DAILY AS DIRECTED., Disp: 200 each, Rfl: 2 .  glucose blood test strip, 1 each by Other route 2 times daily., Disp: , Rfl:  .  hydrochlorothiazide (HYDRODIURIL) 12.5 MG tablet, Take 12.5 mg by mouth every morning., Disp: , Rfl:  .  insulin lispro (HUMALOG KWIKPEN) 100 UNIT/ML KiwkPen, INJECT UNDER THE SKIN THREE TIMES DAILY( 25 UNITS, 25 UNITS AND 20 UNITS RESPECTIVELY BEFORE MEALS) (Patient taking differently: Inject 20-30 Units into the skin See admin instructions. INJECT UNDER THE SKIN THREE TIMES DAILY( 25 units in the morning, 20 units in the afternoon, 30 units in the evening per patient.), Disp: 30 mL, Rfl: 5 .  Insulin Pen Needle 31G X 8 MM MISC, Inject 31 g into the skin., Disp: , Rfl:  .  leuprolide (LUPRON) 11.25 MG injection, Inject 11.25 mg into the muscle every 6 (six) months. , Disp: , Rfl:  .  levothyroxine (SYNTHROID) 125 MCG tablet, Take 1 tablet (125 mcg total) by mouth daily before breakfast., Disp: 30 tablet, Rfl: 0 .  Multiple Vitamins-Iron (DAILY-VITE/IRON/BETA-CAROTENE) TABS, Take 1 tablet by mouth daily. , Disp: , Rfl:  .  zinc gluconate  50 MG tablet, Take 50 mg by mouth daily., Disp: , Rfl:  .  apixaban (ELIQUIS) 5 MG TABS tablet, Take 1 tablet (5 mg total) by mouth 2 (two) times daily., Disp: 180 tablet, Rfl: 1      Objective:   Vitals:   07/19/20 0911  BP: 122/76  Pulse: 70  Temp: (!) 97.3 F (36.3 C)  SpO2: 98%  Weight: 184 lb (83.5 kg)  Height: 5' 9"  (1.753 m)    Estimated body mass index is 27.17 kg/m as calculated from the following:   Height as of this encounter: 5' 9"  (1.753 m).   Weight as of this encounter: 184 lb (83.5 kg).  @WEIGHTCHANGE @  [employer]   07/19/20 0911  Weight: 184 lb (83.5 kg)     Physical Exam   General: No distress. Lean. KYPHOTIC Neuro: Alert and Oriented x 3. GCS 15. Speech normal Psych: Pleasant Resp:  Barrel Chest - no.  Wheeze - no, Crackles - no, No overt respiratory distress CVS: Normal heart sounds. Murmurs - no Ext: Stigmata of Connective Tissue Disease - no HEENT: Normal upper airway. PEERL +. No post nasal drip        Assessment:       ICD-10-CM   1. Respiratory failure, post-operative ([place])  [id number] Pulmonary function test    Pulmonary function test  2. History of lung cancer  Z85.118 Pulmonary function test  3. Status post lobectomy of lung  Z90.2 Pulmonary function test  4. Nodule of lower lobe of left lung  R91.1 Pulmonary function test       Plan:     Patient Instructions  Respiratory failure, post-operative ([place])  -It is unclear why you had postoperative respiratory failure resulting in reintubation.  Blood gas shows retention of carbon dioxide.  It is possible that prior lobectomy has played a role.  There might be underlying COPD as well.  Pthere is no evidence of myasthenia gravis as evidenced by normal acetylcholine receptor  antibody   - you did desaturate with walking - suggesting underlyuing copd  Plan - Recommend porttable o2 - but respect you declinin git -Do full pulmonary function test sometime in the next few weeks to few  months -Do arterial blood gas test on room air sometime in the next few weeks to few months  History of lung cancer Status post lobectomy of lung in 2016 Most recent CT scan of the chest July 2021 without recurrence   -Surveillance protocol by Dr. Julien Nordmann  Nodule of lower lobe of left lung -6 mm in July 2021  Plan  -We will monitor this in the surveillance CT scans done by Dr. Julien Nordmann  Follow-up -Return to see Dr. Chase Caller in the next few weeks to few months but after completing the test  -COPD CAT score at follow-up     SIGNATURE    Dr. Brand Males, M.D., F.C.C.P,  Pulmonary and Critical Care Medicine Staff Physician, [place] Director - Interstitial Lung Disease  Program  Pulmonary [place] at [place], [place], [address]  Pager: [phone], If no answer or between  15:00h - 7:00h: call [phone] Telephone: [phone]  10:07 AM 07/19/2020

## 2020-07-19 NOTE — Patient Instructions (Addendum)
Respiratory failure, post-operative ([place])  -It is unclear why you had postoperative respiratory failure resulting in reintubation.  Blood gas shows retention of carbon dioxide.  It is possible that prior lobectomy has played a role.  There might be underlying COPD as well.  Pthere is no evidence of myasthenia gravis as evidenced by normal acetylcholine receptor antibody   - you did desaturate with walking - suggesting underlyuing copd  Plan - Recommend porttable o2 - but respect you declinin git -Do full pulmonary function test sometime in the next few weeks to few months -Do arterial blood gas test on room air sometime in the next few weeks to few months  History of lung cancer Status post lobectomy of lung in 2016 Most recent CT scan of the chest July 2021 without recurrence   -Surveillance protocol by Dr. Julien Nordmann  Nodule of lower lobe of left lung -6 mm in July 2021  Plan  -We will monitor this in the surveillance CT scans done by Dr. Julien Nordmann  Follow-up -Return to see Dr. Chase Caller in the next few weeks to few months but after completing the test  -COPD CAT score at follow-up

## 2020-08-01 ENCOUNTER — Ambulatory Visit (HOSPITAL_COMMUNITY)
Admission: RE | Admit: 2020-08-01 | Discharge: 2020-08-01 | Disposition: A | Discharge status: Home / Self Care | Payer: Medicare Other | Source: Ambulatory Visit | Attending: Physician Assistant | Admitting: Physician Assistant

## 2020-08-01 ENCOUNTER — Encounter (HOSPITAL_COMMUNITY): Payer: Self-pay | Admitting: Physician Assistant

## 2020-08-01 ENCOUNTER — Other Ambulatory Visit: Payer: Self-pay

## 2020-08-01 VITALS — BP 126/64 | HR 74 | Ht 69.0 in | Wt 178.0 lb

## 2020-08-01 DIAGNOSIS — Z85118 Personal history of other malignant neoplasm of bronchus and lung: Secondary | ICD-10-CM | POA: Diagnosis not present

## 2020-08-01 DIAGNOSIS — Z794 Long term (current) use of insulin: Secondary | ICD-10-CM | POA: Diagnosis not present

## 2020-08-01 DIAGNOSIS — Z9049 Acquired absence of other specified parts of digestive tract: Secondary | ICD-10-CM | POA: Diagnosis not present

## 2020-08-01 DIAGNOSIS — D6869 Other thrombophilia: Secondary | ICD-10-CM | POA: Insufficient documentation

## 2020-08-01 DIAGNOSIS — Z79890 Hormone replacement therapy (postmenopausal): Secondary | ICD-10-CM | POA: Diagnosis not present

## 2020-08-01 DIAGNOSIS — I480 Paroxysmal atrial fibrillation: Secondary | ICD-10-CM | POA: Insufficient documentation

## 2020-08-01 DIAGNOSIS — E119 Type 2 diabetes mellitus without complications: Secondary | ICD-10-CM | POA: Diagnosis not present

## 2020-08-01 DIAGNOSIS — Z87891 Personal history of nicotine dependence: Secondary | ICD-10-CM | POA: Diagnosis not present

## 2020-08-01 DIAGNOSIS — Z79899 Other long term (current) drug therapy: Secondary | ICD-10-CM | POA: Diagnosis not present

## 2020-08-01 DIAGNOSIS — K5090 Crohn's disease, unspecified, without complications: Secondary | ICD-10-CM | POA: Insufficient documentation

## 2020-08-01 DIAGNOSIS — I951 Orthostatic hypotension: Secondary | ICD-10-CM | POA: Diagnosis not present

## 2020-08-01 DIAGNOSIS — Z981 Arthrodesis status: Secondary | ICD-10-CM | POA: Diagnosis not present

## 2020-08-01 DIAGNOSIS — R319 Hematuria, unspecified: Secondary | ICD-10-CM | POA: Insufficient documentation

## 2020-08-01 DIAGNOSIS — E039 Hypothyroidism, unspecified: Secondary | ICD-10-CM | POA: Diagnosis not present

## 2020-08-01 DIAGNOSIS — I10 Essential (primary) hypertension: Secondary | ICD-10-CM | POA: Insufficient documentation

## 2020-08-01 DIAGNOSIS — Z7901 Long term (current) use of anticoagulants: Secondary | ICD-10-CM | POA: Insufficient documentation

## 2020-08-01 NOTE — Progress Notes (Signed)
Primary Care Physician: Prince Solian, MD Primary Cardiologist: Dr Harrington Challenger Primary Electrophysiologist: none Referring Physician: Dr [name] is a 80 y.o. male with a history of nonsmall cell lung CA, HTN, hypothyroidism, Chron disease, DM, and paroxysmal atrial fibrillation who presents for follow up in the [facility]. The patient was initially diagnosed with atrial fibrillation 03/26/20 after presenting with symptoms of fatigue and presyncope. EMS was called and he was hypotensive and in afib with RVR. He was given a bolus of fluid and IV diltiazem and converted to SR en route to the ED. Patient was started on Eliquis for a CHADS2VASC score of 4. There were no specific triggers he could identify.   On follow up today, patient reports he has done well. Patient is s/p removal of bladder calculus 6/64/40 which was complicated by post anesthesia agonal respirations. He was intubated but over the next couple hours recovered. Eliquis was resumed. He denies any further hematuria. He did have two brief episodes of palpitations which resolved quickly.   Today, he denies symptoms of chest pain, shortness of breath, orthopnea, PND, lower extremity edema, dizziness, presyncope, syncope, snoring, daytime somnolence, bleeding, or neurologic sequela. The patient is tolerating medications without difficulties and is otherwise without complaint today.    Atrial Fibrillation Risk Factors:  he does not have symptoms or diagnosis of sleep apnea. he does not have a history of rheumatic fever. he does not have a history of alcohol use. The patient does not have a history of early familial atrial fibrillation or other arrhythmias.  he has a BMI of Body mass index is 26.29 kg/m.Marland Kitchen Filed Weights   08/01/20 1327  Weight: 80.7 kg    Family History  Problem Relation Age of Onset  . CAD Mother   . Cancer Neg Hx        No cancer in the patient's immediate family,  except of course for the patient himself, as noted     Atrial Fibrillation Management history:  Previous antiarrhythmic drugs: none Previous cardioversions: none Previous ablations: none CHADS2VASC score: 4 Anticoagulation history: Eliquis   Past Medical History:  Diagnosis Date  . A-fib ([place])   . Adenocarcinoma of left lung, stage 1 ([place]) 03/10/2015  . Ankylosing spondylitis ([place])    Diagnosed during lumbar fracture summer of 2016    . Crohn's disease ([place])   . Gout   . HIstory of basal cell cancer of face    THYROID CA HX  . History of kidney stones   . Hypertension   . Hypocalcemia 04/13/2007  . Hypothyroidism   . Impotence   . 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 Obesity (BMI 30-39.9)   . Osteoporosis    Pt completed 5 years of fosamax in 2013     . Prostate cancer with recurrence    Treated with prostatectomy with recurrence 2012 with Lupron treatment now him   . Psoriasis   . Thyroid cancer ([place]) 1999   Treated with RAI and total thyroidectomy   . Type II diabetes mellitus ([place])    Past Surgical History:  Procedure Laterality Date  . ABDOMINAL EXPLORATION SURGERY     for small bowel obstruction  . APPENDECTOMY  03/2007  . BACK SURGERY    . BASAL CELL CARCINOMA EXCISION  "several"   "head"  . CARDIAC CATHETERIZATION  03/17/2003  . CHOLECYSTECTOMY N/A 05/07/2016   Procedure: LAPAROSCOPIC CHOLECYSTECTOMY;  Surgeon: Fanny Skates, MD;  Location: [facility];  Service: General;  Laterality: N/A;  . COLON SURGERY  03/2007   Resection of cecum, appendix, terminal ileum (approximately/notes 10/10/2010  . CYSTOSCOPY/URETEROSCOPY/HOLMIUM LASER/STENT PLACEMENT Right 07/03/2020   Procedure: CYSTOSCOPY/RETROGRADE/URETEROSCOPY REMOVAL OF BLADDER STONE;  Surgeon: Raynelle Bring, MD;  Location: [facility];  Service: Urology;  Laterality: Right;  . HERNIA REPAIR    . LAPAROSCOPIC CHOLECYSTECTOMY  05/07/2016  . LAPAROSCOPIC LYSIS OF ADHESIONS  05/07/2016  .  LAPAROSCOPIC LYSIS OF ADHESIONS N/A 05/07/2016   Procedure: LAPAROSCOPIC LYSIS OF ADHESIONS TIMES ONE HOUR;  Surgeon: Fanny Skates, MD;  Location: [facility];  Service: General;  Laterality: N/A;  . POSTERIOR FUSION THORACIC SPINE  02/08/2016   1. Posterior thoracic arthrodesis T7-T11 utilizing morcellized allograft, 2. Posterior thoracic segmental fixation T7-T11 utilizing nuvasive pedicle screws  . PROSTATECTOMY  06/2001   w/bilateral pelvic lymph nose dissection/notes 10/24/2010  . SPINAL FUSION  12/2014   Open reduction internal fixation of L1 Chance fracture with posterior fusion T10-L4 utilizing morcellized allograft and some local autograft, segmental instrumentation T10-L4 inclusive utilizing nuvasive pedicle screws/notes 12/16/2014  . Stress Cardiolite  02/17/2003  . THOROCOTOMY WITH LOBECTOMY  03/16/2015   Procedure: THOROCOTOMY WITH LOBECTOMY;  Surgeon: Ivin Poot, MD;  Location: [facility];  Service: Thoracic;;  . TONSILLECTOMY    . TOTAL THYROIDECTOMY  1997  . Venous Doppler  05/30/2004  . VENTRAL HERNIA REPAIR  04/14/2008  . VIDEO ASSISTED THORACOSCOPY Left 03/16/2015   Procedure: VIDEO ASSISTED THORACOSCOPY;  Surgeon: Ivin Poot, MD;  Location: [facility];  Service: Thoracic;  Laterality: Left;    Current Outpatient Medications  Medication Sig Dispense Refill  . ALPRAZolam (XANAX) 0.5 MG tablet Take 0.5 mg by mouth 2 (two) times daily as needed for anxiety.    Marland Kitchen apixaban (ELIQUIS) 5 MG TABS tablet Take 1 tablet (5 mg total) by mouth 2 (two) times daily. 180 tablet 1  . ASACOL HD 800 MG TBEC Take 1,600 mg by mouth 2 (two) times daily.  3  . atenolol (TENORMIN) 25 MG tablet Take 1 tablet (25 mg total) by mouth 2 (two) times daily.    . B-D ULTRAFINE III SHORT PEN 31G X 8 MM MISC USE 1 PEN NEEDLE FOUR TIMES DAILY. 100 each 0  . busPIRone (BUSPAR) 15 MG tablet Take 15 mg by mouth 2 (two) times daily.     . calcium-vitamin D (OSCAL WITH D) 500-200 MG-UNIT tablet Take 2 tablets by mouth  daily.     Marland Kitchen diltiazem (CARDIZEM) 30 MG tablet Take 1 tablet every 4 hours AS NEEDED for heart rate >100 as long as blood pressure >100. 45 tablet 1  . Glucose Blood (ASCENSIA CONTOUR TEST VI) Use as directed to test two times a day    . glucose blood (BAYER CONTOUR TEST) test strip TEST BLOOD SUGAR TWICE DAILY AS DIRECTED. 200 each 2  . glucose blood test strip 1 each by Other route 2 times daily.    . hydrochlorothiazide (HYDRODIURIL) 12.5 MG tablet Take 12.5 mg by mouth every morning.    . insulin lispro (HUMALOG KWIKPEN) 100 UNIT/ML KiwkPen INJECT UNDER THE SKIN THREE TIMES DAILY( 25 UNITS, 25 UNITS AND 20 UNITS RESPECTIVELY BEFORE MEALS) (Patient taking differently: Inject 20-30 Units into the skin See admin instructions. INJECT UNDER THE SKIN THREE TIMES DAILY( 25 units in the morning, 20 units in the afternoon, 30 units in the evening per patient.) 30 mL 5  . Insulin Pen Needle 31G X 8 MM MISC  Inject 31 g into the skin.    Marland Kitchen leuprolide (LUPRON) 11.25 MG injection Inject 11.25 mg into the muscle every 6 (six) months.     . levothyroxine (SYNTHROID) 125 MCG tablet Take 1 tablet (125 mcg total) by mouth daily before breakfast. 30 tablet 0  . Multiple Vitamins-Iron (DAILY-VITE/IRON/BETA-CAROTENE) TABS Take 1 tablet by mouth daily.     Marland Kitchen zinc gluconate 50 MG tablet Take 50 mg by mouth daily.     No current facility-administered medications for this encounter.    No Known Allergies  Social History   Socioeconomic History  . Marital status: Married    Spouse name: Not on file  . Number of children: Not on file  . Years of education: Not on file  . Highest education level: Not on file  Occupational History  . Occupation: Retired  Tobacco Use  . Smoking status: Former Smoker    Packs/day: 2.00    Years: 30.00    Pack years: 60.00    Types: Cigarettes    Quit date: 12/26/1989    Years since quitting: 30.6  . Smokeless tobacco: Never Used  Vaping Use  . Vaping Use: Never used   Substance and Sexual Activity  . Alcohol use: Not Currently    Alcohol/week: 0.0 standard drinks  . Drug use: No  . Sexual activity: Never  Other Topics Concern  . Not on file  Social History Narrative   Retired Development worker, international aid   Social Determinants of Radio broadcast assistant Strain: Not on [employer] Insecurity: Not on file  Transportation Needs: Not on file  Physical Activity: Not on file  Stress: Not on file  Social Connections: Not on file  Intimate Partner Violence: Not on file     ROS- All systems are reviewed and negative except as per the HPI above.  Physical Exam: Vitals:   08/01/20 1327  BP: 126/64  Pulse: 74  Weight: 80.7 kg  Height: 5\' 9"  (1.753 m)    GEN- The patient is well appearing elderly male, alert and oriented x 3 today.   HEENT-head normocephalic, atraumatic, sclera clear, conjunctiva pink, hearing intact, trachea midline. Lungs- Clear to ausculation bilaterally, normal work of breathing Heart- Regular rate and rhythm, no murmurs, rubs or gallops  GI- soft, NT, ND, + BS Extremities- no clubbing, cyanosis, or edema MS- no significant deformity or atrophy Skin- no rash or lesion Psych- euthymic mood, full affect Neuro- strength and sensation are intact   Wt Readings from Last 3 Encounters:  08/01/20 80.7 kg  07/19/20 83.5 kg  07/03/20 81.8 kg    EKG today demonstrates  SR Vent. rate 74 BPM PR interval 118 ms QRS duration 88 ms QT/QTc 400/444 ms  Echo 01/05/20 demonstrated  1. Left ventricular ejection fraction, by estimation, is 60 to 65%. The  left ventricle has normal function. The left ventricle has no regional  wall motion abnormalities. There is mild left ventricular hypertrophy of  the posterior segment. Left  ventricular diastolic parameters are consistent with Grade I diastolic  dysfunction (impaired relaxation).  2. Right ventricular systolic function is normal. The right ventricular  size is normal. There is normal  pulmonary artery systolic pressure.  3. The mitral valve is normal in structure. Mild mitral valve  regurgitation. No evidence of mitral stenosis.  4. The aortic valve is tricuspid. Aortic valve regurgitation is not  visualized. No aortic stenosis is present.  5. The inferior vena cava is normal in size with greater than  50%  respiratory variability, suggesting right atrial pressure of 3 mmHg.  Epic records are reviewed at length today  CHA2DS2-VASc Score = 4  The patient's score is based upon: CHF History: No HTN History: Yes Diabetes History: Yes Stroke History: No Vascular Disease History: No Age Score: 2 Gender Score: 0      ASSESSMENT AND PLAN: 1. Paroxysmal Atrial Fibrillation (ICD10:  I48.0) The patient's CHA2DS2-VASc score is 4, indicating a 4.8% annual risk of stroke.   Patient only having very brief episodes of palpitations.  Will not increase daily rate control given h/o significant orthostatic hypotension.  Continue Eliquis 5 mg BID Continue atenolol 25 mg BID Can consider flecainide if afib becomes more persistent.   2. Secondary Hypercoagulable State (ICD10:  D68.69) The patient is at significant risk for stroke/thromboembolism based upon his CHA2DS2-VASc Score of 4.  Continue Apixaban (Eliquis).   3. HTN Stable, treating conservatively with h/o orthostatic hypotension.  4. Hematuria S/p cystoscopy, right ureteroscopy and removal of bladder calculus on 07/03/20. Hematuria resolved.   Follow up in the AF clinic in 3 months.    [facility] [address] [place], [place] [address] [phone] 08/01/2020 1:34 PM

## 2020-08-28 ENCOUNTER — Other Ambulatory Visit (HOSPITAL_COMMUNITY)
Admission: RE | Admit: 2020-08-28 | Discharge: 2020-08-28 | Disposition: A | Discharge status: Home / Self Care | Payer: Medicare Other | Source: Ambulatory Visit | Attending: Internal Medicine | Admitting: Internal Medicine

## 2020-08-28 DIAGNOSIS — Z20822 Contact with and (suspected) exposure to covid-19: Secondary | ICD-10-CM | POA: Insufficient documentation

## 2020-08-28 DIAGNOSIS — Z01812 Encounter for preprocedural laboratory examination: Secondary | ICD-10-CM | POA: Diagnosis present

## 2020-08-29 LAB — SARS CORONAVIRUS 2 (TAT 6-24 HRS): SARS Coronavirus 2: NEGATIVE

## 2020-08-31 ENCOUNTER — Encounter: Payer: Self-pay | Admitting: Internal Medicine

## 2020-08-31 ENCOUNTER — Ambulatory Visit: Payer: Medicare Other | Admitting: Internal Medicine

## 2020-08-31 ENCOUNTER — Other Ambulatory Visit: Payer: Self-pay

## 2020-08-31 ENCOUNTER — Ambulatory Visit (INDEPENDENT_AMBULATORY_CARE_PROVIDER_SITE_OTHER): Payer: Medicare Other | Admitting: Internal Medicine

## 2020-08-31 VITALS — BP 116/70 | HR 71 | Temp 97.6°F | Ht 68.0 in | Wt 183.8 lb

## 2020-08-31 DIAGNOSIS — J95821 Acute postprocedural respiratory failure: Secondary | ICD-10-CM

## 2020-08-31 DIAGNOSIS — R0902 Hypoxemia: Secondary | ICD-10-CM | POA: Diagnosis not present

## 2020-08-31 DIAGNOSIS — R911 Solitary pulmonary nodule: Secondary | ICD-10-CM

## 2020-08-31 DIAGNOSIS — J449 Chronic obstructive pulmonary disease, unspecified: Secondary | ICD-10-CM | POA: Diagnosis not present

## 2020-08-31 DIAGNOSIS — Z85118 Personal history of other malignant neoplasm of bronchus and lung: Secondary | ICD-10-CM

## 2020-08-31 DIAGNOSIS — Z902 Acquired absence of lung [part of]: Secondary | ICD-10-CM

## 2020-08-31 LAB — PULMONARY FUNCTION TEST
DL/VA % pred: 116 %
DL/VA: 4.64 ml/min/mmHg/L
DLCO cor % pred: 59 %
DLCO cor: 13.79 ml/min/mmHg
DLCO unc % pred: 59 %
DLCO unc: 13.79 ml/min/mmHg
FEF 25-75 Post: 1.14 L/sec
FEF 25-75 Pre: 2.57 L/sec
FEF2575-%Change-Post: -55 %
FEF2575-%Pred-Post: 58 %
FEF2575-%Pred-Pre: 132 %
FEV1-%Change-Post: -11 %
FEV1-%Pred-Post: 46 %
FEV1-%Pred-Pre: 52 %
FEV1-Post: 1.28 L
FEV1-Pre: 1.45 L
FEV1FVC-%Change-Post: 3 %
FEV1FVC-%Pred-Pre: 124 %
FEV6-%Change-Post: -14 %
FEV6-%Pred-Post: 38 %
FEV6-%Pred-Pre: 44 %
FEV6-Post: 1.37 L
FEV6-Pre: 1.6 L
FEV6FVC-%Pred-Post: 107 %
FEV6FVC-%Pred-Pre: 107 %
FVC-%Change-Post: -14 %
FVC-%Pred-Post: 35 %
FVC-%Pred-Pre: 42 %
FVC-Post: 1.37 L
FVC-Pre: 1.61 L
Post FEV1/FVC ratio: 93 %
Post FEV6/FVC ratio: 100 %
Pre FEV1/FVC ratio: 90 %
Pre FEV6/FVC Ratio: 100 %
RV % pred: 107 %
RV: 2.68 L
TLC % pred: 62 %
TLC: 4.13 L

## 2020-08-31 MED ORDER — ALBUTEROL SULFATE HFA 108 (90 BASE) MCG/ACT IN AERS
2.0000 | INHALATION_SPRAY | Freq: Four times a day (QID) | RESPIRATORY_TRACT | 6 refills | Status: DC | PRN
Start: 2020-08-31 — End: 2020-11-20

## 2020-08-31 MED ORDER — SPIRIVA RESPIMAT 2.5 MCG/ACT IN AERS: 2.0000 | INHALATION_SPRAY | Freq: Every day | RESPIRATORY_TRACT | 0 refills | Status: DC

## 2020-08-31 NOTE — Progress Notes (Signed)
PFT done today. 

## 2020-08-31 NOTE — Progress Notes (Signed)
@Patient  ID: Devin [name], male    DOB: 1941-01-16, 80 y.o.   MRN: [id number]  Chief Complaint  Patient presents with  . Consult    Referring provider: Prince Solian, [name]  HPI:    01/17/2020 Pulmonary consult : Lung nodule  Patient presents for a pulmonary consult for lung nodule recently seen [name] CT scan during hospitalization last month.  Patient was recently admitted to the hospital July 27 through January 06, 2020 for shortness of breath and chest pain.  Found to have a non-STEMI due to hypertensive urgency and respiratory distress.  Patient was seen by cardiology and had a nuclear perfusion stress test that was considered low risk.  Echo showed preserved EF grade 1 diastolic dysfunction.  And normal pulmonary artery systolic pressure.  Inflammatory markers with see reactive protein and sed rate were normal. CT angio chest was negative for PE showed stable left upper lobectomy postop changes.  Stable 6 mm pulmonary nodule in the left lower lobe.  And a unchanged loculated left pleural effusion.  Patient says he was diagnosed with pneumonia few months ago and was treated however continued to have shortness of breath over the last 3 to 4 months.  Has been very sedentary with little activity.  And has had a slow weight gain of about 60 pounds over the last 5 years.  He is retired from [employer].  Denies any known exposure to chronic asbestos.  He does have Crohn's disease [name] Asacol .  He has a basement that is completely finished with a dehumidifier and no known mold.  Denies any birds or chickens.  No significant travel.  Says it whenever he tries to walk any mild dizziness does get some shortness of breath.  He denies any leg swelling orthopnea or chest pain.  He is a former smoker.  Denies any previous diagnosis of COPD or asthma.  Denies any cough or wheezing.  Patient did have pulmonary function testing in 2018 that showed moderate restriction with an FEV1 at 72%, ratio 78, FVC 67%,  DLCO 57%.   80 year old male former smoker presents for pulmonary consult for lung nodule.  Patient has a history of stage Ia non-small cell lung cancer, adenocarcinoma diagnosed in 2016 status post left VATS with left upper lobectomy and mediastinal lymph node dissection with Dr. Darcey [name] in October 2016, remains [name] surveillance with Dr. Earlie [name] with yearly CT scans Medical history significant for type 1 diabetes/insulin-dependent   TEST/EVENTS :  CT chest September 06, 2019 postop changes in the left upper lobe no evidence of malignant recurrence, stable benign 4 to 5 mm pulmonary nodule in the superior segment of the left lower lobe.  Unchanged small loculated left pleural effusion      OV 07/19/2020  Subjective:  Patient ID: Devin [name], male , DOB: [birth year] , age 63 y.o. , MRN: [id number] , ADDRESS: [address] [place] [id number] PCP Devin [name] Patient Care Team: Devin [name] as PCP - General (Internal Medicine) Devin Klein, [name] as PCP - Cardiology (Cardiology) Devin Bring, [name] as Attending Physician (Urology) Devin Campbell, [name] as Attending Physician (Gastroenterology) Devin Reedy, [name] as Attending Physician (Cardiology) Devin Fam, [name] as Consulting Physician (Ophthalmology) Devin Bears, [name] as Consulting Physician (Oncology)  This Provider for this visit: Treatment Team:  Attending Provider: Brand Males, [name]    07/19/2020 -   Chief Complaint  Patient presents with  . Follow-up    Hospital [name] 07/03/20. No complaints  Post hospital follow-up for postoperative respiratory failure -January 2022 History of lobectomy for lung cancer in 2016 -surveillance by Devin [name] 6 mm left lower lobe lung nodule -July 2021  HPI Devin [name] 80 y.o. -was seen in the end of January 2022 at [facility], ICU post urologic surgery where he required reintubation.  Blood gas at that time showed hypercapnia.  Acetylcholine receptor  antibody was normal.  He was discharged in short order.  He presents for follow-up.  He tells me that he has been stable.  He says he was surprised by the postoperative respiratory failure.  He says he has had previous extensive back surgery prostate surgery colonoscopy all without any problems.  However he says that all this was before his lung cancer lobectomy in 2016.  At this point in time no further surgeries planned.  He is not having any shortness of breath or cough or wheezing.  Just mild shortness of breath with exertion such as climbing 1 or 2 flights of stairs.  No problems changing clothes.  No problems with activities of daily living no orthopnea no proximal nocturnal dyspnea.  No hemoptysis.   His PFTs do show a decline after his lobectomy and is documented below.  But his last PFT was over 3 years ago.  Simple office walk 185 feet x  3 laps goal with forehead probe 07/19/2020   O2 used ra  Number laps completed 1 lap  Comments about pace normal  Resting Pulse Ox/HR 97% and 68/min  Final Pulse Ox/HR 86% and 81/min  Desaturated </= 88% ys  Desaturated <= 3% points ye  Got Tachycardic >/= 90/min no  Symptoms at end of test none  Miscellaneous comments Refused o2        No flowsheet data found. Results for Devin [name]" (MRN [id number]) as of 07/19/2020 09:39  Ref. Range 07/03/2020 16:38  Acety choline binding ab Latest Ref Range: 0.00 - 0.24 nmol/L <0.03    Results for Devin [name]" (MRN [id number]) as of 07/19/2020 09:39  Ref. Range 07/03/2020 21:00  FIO2 Unknown 40.00  pH, Arterial Latest Ref Range: 7.350 - 7.450  7.324 (L)  pCO2 arterial Latest Ref Range: 32.0 - 48.0 mmHg 69.8 (HH)  pO2, Arterial Latest Ref Range: 83.0 - 108.0 mmHg 145 (H)    PFT  PFT Results Latest Ref Rng & Units 07/18/2016 03/08/2015  FVC-Pre L 2.68 2.96  FVC-Predicted Pre % 67 73  FVC-Post L 2.57 3.10  FVC-Predicted Post % 64 77  Pre FEV1/FVC % % 78 81  Post FEV1/FCV % % 76  83  FEV1-Pre L 2.09 2.39  FEV1-Predicted Pre % 72 81  FEV1-Post L 1.95 2.57  DLCO uncorrected ml/min/mmHg 17.16 26.22  DLCO UNC% % 57 88  DLCO corrected ml/min/mmHg 17.62 -  DLCO COR %Predicted % 59 -  DLVA Predicted % 96 110  TLC L 5.08 4.81  TLC % Predicted % 76 72  RV % Predicted % 106 76      OV 08/31/2020  Subjective:  Patient ID: Devin [name], male , DOB: 31-May-1941 , age 55 y.o. , MRN: [id number] , ADDRESS: [address] [place] [id number] PCP Devin [name] Patient Care Team: Devin [name] as PCP - General (Internal Medicine) Devin Klein, [name] as PCP - Cardiology (Cardiology) Devin Bring, [name] as Attending Physician (Urology) Devin Campbell, [name] as Attending Physician (Gastroenterology) Devin Reedy, [name] as Attending Physician (Cardiology) Devin Fam, [name] as Consulting Physician (  Ophthalmology) Devin Bears, [name] as Consulting Physician (Oncology)  This Provider for this visit: Treatment Team:  Attending Provider: Brand Males, [name]    08/31/2020 -   Chief Complaint  Patient presents with  . Follow-up    Pt states occasional shortness of breath with activity. PFT done - review results.      HPI FINBAR NIPPERT 80 y.o. -returns for follow-up to discuss his pulmonary function test report.  He has no new problems.  His only question is that in the future if he has medical procedures such as upcoming dental procedure in the next several months or colonoscopy in the next few years what the best preop advised.  His pulmonary function test shows restriction but his CT scan only shows emphysema.  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  He is willing to try inhaler therapy for this.  He does have dyspnea [name] exertion for stairs but otherwise is okay.   CAT Score 08/31/2020  Total CAT Score 5       Simple office walk 185 feet x  3 laps goal with forehead probe 07/19/2020   O2 used ra  Number laps  completed 1 lap  Comments about pace normal  Resting Pulse Ox/HR 97% and 68/min  Final Pulse Ox/HR 86% and 81/min  Desaturated </= 88% ys  Desaturated <= 3% points ye  Got Tachycardic >/= 90/min no  Symptoms at end of test none  Miscellaneous comments Refused o2     PFT  PFT Results Latest Ref Rng & Units 08/31/2020 07/18/2016 03/08/2015  FVC-Pre L 1.61 2.68 2.96  FVC-Predicted Pre % 42 67 73  FVC-Post L 1.37 2.57 3.10  FVC-Predicted Post % 35 64 77  Pre FEV1/FVC % % 90 78 81  Post FEV1/FCV % % 93 76 83  FEV1-Pre L 1.45 2.09 2.39  FEV1-Predicted Pre % 52 72 81  FEV1-Post L 1.28 1.95 2.57  DLCO uncorrected ml/min/mmHg 13.79 17.16 26.22  DLCO UNC% % 59 57 88  DLCO corrected ml/min/mmHg 13.79 17.62 -  DLCO COR %Predicted % 59 59 -  DLVA Predicted % 116 96 110  TLC L 4.13 5.08 4.81  TLC % Predicted % 62 76 72  RV % Predicted % 107 106 76     IMPRESSION: 1. No acute intrathoracic pathology. No CT evidence of pulmonary embolism. 2. Stable postsurgical changes of left upper lobectomy. 3. Stable small loculated left pleural effusion. 4. A 6 mm left lower lobe pulmonary nodule. Attention [name] follow-up imaging recommended.   Electronically Signed   By: Anner Crete M.D.   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   reports that he quit smoking about 30 years ago. His smoking use included cigarettes. He has a 60.00 pack-year smoking history. He has never used smokeless tobacco.  Past Surgical History:   Procedure Laterality Date  . ABDOMINAL EXPLORATION SURGERY     for small bowel obstruction  . APPENDECTOMY  03/2007  . BACK SURGERY    . BASAL CELL CARCINOMA EXCISION  "several"   "head"  . CARDIAC CATHETERIZATION  03/17/2003  .  CHOLECYSTECTOMY [name]/A 05/07/2016   Procedure: LAPAROSCOPIC CHOLECYSTECTOMY;  Surgeon: Fanny Skates, [name];  Location: [place];  Service: General;  Laterality: [name]/A;  . COLON SURGERY  03/2007   Resection of cecum, appendix, terminal ileum (approximately/notes 10/10/2010  . CYSTOSCOPY/URETEROSCOPY/HOLMIUM LASER/STENT PLACEMENT Right 07/03/2020   Procedure: CYSTOSCOPY/RETROGRADE/URETEROSCOPY REMOVAL OF BLADDER STONE;  Surgeon: Devin Bring, [name];  Location: [facility];  Service: Urology;  Laterality: Right;  . HERNIA REPAIR    . LAPAROSCOPIC CHOLECYSTECTOMY  05/07/2016  . LAPAROSCOPIC LYSIS OF ADHESIONS  05/07/2016  . LAPAROSCOPIC LYSIS OF ADHESIONS [name]/A 05/07/2016   Procedure: LAPAROSCOPIC LYSIS OF ADHESIONS TIMES ONE HOUR;  Surgeon: Fanny Skates, [name];  Location: [place];  Service: General;  Laterality: [name]/A;  . POSTERIOR FUSION THORACIC SPINE  02/08/2016   1. Posterior thoracic arthrodesis T7-T11 utilizing morcellized allograft, 2. Posterior thoracic segmental fixation T7-T11 utilizing nuvasive pedicle screws  . PROSTATECTOMY  06/2001   w/bilateral pelvic lymph nose dissection/notes 10/24/2010  . SPINAL FUSION  12/2014   Open reduction internal fixation of L1 Chance fracture with posterior fusion T10-L4 utilizing morcellized allograft and some local autograft, segmental instrumentation T10-L4 inclusive utilizing nuvasive pedicle screws/notes 12/16/2014  . Stress Cardiolite  02/17/2003  . THOROCOTOMY WITH LOBECTOMY  03/16/2015   Procedure: THOROCOTOMY WITH LOBECTOMY;  Surgeon: Ivin Poot, [name];  Location: [place];  Service: Thoracic;;  . TONSILLECTOMY    . TOTAL THYROIDECTOMY  1997  . Venous Doppler  05/30/2004  . VENTRAL HERNIA REPAIR  04/14/2008  . VIDEO ASSISTED THORACOSCOPY  Left 03/16/2015   Procedure: VIDEO ASSISTED THORACOSCOPY;  Surgeon: Ivin Poot, [name];  Location: [place];  Service: Thoracic;  Laterality: Left;    No Known Allergies  Immunization History  Administered Date(s) Administered  . Influenza, High Dose Seasonal PF 05/08/2015, 03/05/2016, 02/26/2017, 02/23/2018  . Influenza, Quadrivalent, Recombinant, Inj, Pf 03/11/2019, 02/24/2020  . Moderna Sars-Covid-2 Vaccination 06/19/2019, 07/25/2019, 08/08/2019  . Pneumococcal Conjugate-13 05/03/2014  . Pneumococcal Polysaccharide-23 12/09/1995, 04/18/2009  . Td 06/10/2002  . Tdap 05/03/2014  . Zoster 05/02/2009    Family History  Problem Relation Age of Onset  . CAD Mother   . Cancer Neg Hx        No cancer in the patient's immediate family, except of course for the patient himself, as noted     Current Outpatient Medications:  .  ALPRAZolam (XANAX) 0.5 MG tablet, Take 0.5 mg by mouth 2 (two) times daily as needed for anxiety., Disp: , Rfl:  .  apixaban (ELIQUIS) 5 MG TABS tablet, Take 5 mg by mouth 2 (two) times daily., Disp: , Rfl:  .  ASACOL HD 800 MG TBEC, Take 1,600 mg by mouth 2 (two) times daily., Disp: , Rfl: 3 .  atenolol (TENORMIN) 25 MG tablet, Take 1 tablet (25 mg total) by mouth 2 (two) times daily., Disp: , Rfl:  .  B-D ULTRAFINE III SHORT PEN 31G X 8 MM MISC, USE 1 PEN NEEDLE FOUR TIMES DAILY., Disp: 100 each, Rfl: 0 .  busPIRone (BUSPAR) 15 MG tablet, Take 15 mg by mouth 2 (two) times daily. , Disp: , Rfl:  .  calcium-vitamin D (OSCAL WITH D) 500-200 MG-UNIT tablet, Take 2 tablets by mouth daily. , Disp: , Rfl:  .  Glucose Blood (ASCENSIA CONTOUR TEST VI), Use as directed to test two times a day, Disp: , Rfl:  .  glucose blood (BAYER CONTOUR TEST) test strip, TEST BLOOD SUGAR TWICE DAILY AS DIRECTED., Disp: 200 each, Rfl: 2 .  glucose  blood test strip, 1 each by Other route 2 times daily., Disp: , Rfl:  .  hydrochlorothiazide (HYDRODIURIL) 12.5 MG tablet, Take 12.5 mg by mouth  every morning., Disp: , Rfl:  .  insulin lispro (HUMALOG KWIKPEN) 100 UNIT/ML KiwkPen, INJECT UNDER THE SKIN THREE TIMES DAILY( 25 UNITS, 25 UNITS AND 20 UNITS RESPECTIVELY BEFORE MEALS) (Patient taking differently: Inject 20-30 Units into the skin See admin instructions. INJECT UNDER THE SKIN THREE TIMES DAILY( 25 units in the morning, 20 units in the afternoon, 30 units in the evening per patient.), Disp: 30 mL, Rfl: 5 .  Insulin Pen Needle 31G X 8 MM MISC, Inject 31 g into the skin., Disp: , Rfl:  .  leuprolide (LUPRON) 11.25 MG injection, Inject 11.25 mg into the muscle every 6 (six) months. , Disp: , Rfl:  .  levothyroxine (SYNTHROID) 125 MCG tablet, Take 1 tablet (125 mcg total) by mouth daily before breakfast., Disp: 30 tablet, Rfl: 0 .  Multiple Vitamins-Iron (DAILY-VITE/IRON/BETA-CAROTENE) TABS, Take 1 tablet by mouth daily. , Disp: , Rfl:  .  Tiotropium Bromide Monohydrate (SPIRIVA RESPIMAT) 2.5 MCG/ACT AERS, Inhale 2 puffs into the lungs daily., Disp: 4 g, Rfl: 0 .  zinc gluconate 50 MG tablet, Take 50 mg by mouth daily., Disp: , Rfl:  .  apixaban (ELIQUIS) 5 MG TABS tablet, Take 1 tablet (5 mg total) by mouth 2 (two) times daily., Disp: 180 tablet, Rfl: 1 .  diltiazem (CARDIZEM) 30 MG tablet, Take 1 tablet every 4 hours AS NEEDED for heart rate >100 as long as blood pressure >100. (Patient not taking: Reported [name] 08/31/2020), Disp: 45 tablet, Rfl: 1      Objective:   Vitals:   08/31/20 1554  BP: 116/70  Pulse: 71  Temp: 97.6 F (36.4 C)  TempSrc: Temporal  SpO2: 94%  Weight: 183 lb 12.8 oz (83.4 kg)  Height: 5' 8"  (1.727 m)    Estimated body mass index is 27.95 kg/m as calculated from the following:   Height as of this encounter: 5' 8"  (1.727 m).   Weight as of this encounter: 183 lb 12.8 oz (83.4 kg).  @WEIGHTCHANGE @  [employer]   08/31/20 1554  Weight: 183 lb 12.8 oz (83.4 kg)     Physical Exam  General: No distress. Look swell Neuro: Alert and Oriented x 3.  GCS 15. Speech normal Psych: Pleasant Resp:  Barrel Chest - no.  Wheeze - no, Crackles - no, No overt respiratory distress CVS: Normal heart sounds. Murmurs - no Ext: Stigmata of Connective Tissue Disease - no HEENT: Normal upper airway. PEERL +. No post nasal drip        Assessment:       ICD-10-CM   1. Moderate COPD (chronic obstructive pulmonary disease) (HCC)  J44.9   2. Exercise hypoxemia  R09.02        Plan:     Patient Instructions     ICD-10-CM   1. Moderate COPD (chronic obstructive pulmonary disease) (HCC)  J44.9   2. Exercise hypoxemia  R09.02     Moderate COPD (chronic obstructive pulmonary disease) (HCC) Exercise hypoxemia  -Pulmonary function test suggest moderate COPD  Plan -Respect your desire not to use portable oxygen with exertion -Start Spiriva Respimat 2.5 mcg dosage 2 puffs once daily -Start albuterol as needed  Preop resp eval   -Any future medical procedures please visit our office 1 month before the procedure to get preop evaluation     History of lung cancer  Status post lobectomy of lung in 2016 Most recent CT scan of the chest July 2021 without recurrence   -Surveillance protocol by Devin [name]  Nodule of lower lobe of left lung -6 mm in July 2021  Plan   monitor this in the surveillance CT scans done by Devin [name]  Follow-up -Return to see Dr. Chase Caller in the next 6 months months   -COPD CAT score at follow-up     SIGNATURE    Dr. Brand [name], M.D., F.C.C.P,  Pulmonary and Critical Care Medicine Staff Physician, [place] Director - Interstitial Lung Disease  Program  Pulmonary [facility] [place], [place], [address]  Pager: [phone], If no answer or between  15:00h - 7:00h: call [phone] Telephone: [phone]  4:30 PM 08/31/2020

## 2020-08-31 NOTE — Progress Notes (Signed)
Patient seen in the office today and instructed on use of Spiriva 2.5.  Patient expressed understanding and demonstrated technique.

## 2020-08-31 NOTE — Patient Instructions (Addendum)
ICD-10-CM   1. Moderate COPD (chronic obstructive pulmonary disease) (HCC)  J44.9   2. Exercise hypoxemia  R09.02     Moderate COPD (chronic obstructive pulmonary disease) (HCC) Exercise hypoxemia  -Pulmonary function test suggest moderate COPD  Plan -Respect your desire not to use portable oxygen with exertion -Start Spiriva Respimat 2.5 mcg dosage 2 puffs once daily -Start albuterol as needed  Preop resp eval   -Any future medical procedures please visit our office 1 month before the procedure to get preop evaluation     History of lung cancer Status post lobectomy of lung in 2016 Most recent CT scan of the chest July 2021 without recurrence   -Surveillance protocol by Dr. Julien Nordmann  Nodule of lower lobe of left lung -6 mm in July 2021  Plan   monitor this in the surveillance CT scans done by Dr. Julien Nordmann  Follow-up -Return to see Dr. Chase Caller in the next 6 months months   -COPD CAT score at follow-up

## 2020-09-04 ENCOUNTER — Inpatient Hospital Stay: Payer: Medicare Other

## 2020-09-04 ENCOUNTER — Telehealth: Payer: Self-pay | Admitting: Internal Medicine

## 2020-09-04 NOTE — Telephone Encounter (Signed)
Scheduled appt per 3/28 sch msg. Pt aware.

## 2020-09-06 ENCOUNTER — Inpatient Hospital Stay: Payer: Medicare Other | Admitting: Internal Medicine

## 2020-09-10 ENCOUNTER — Emergency Department (HOSPITAL_COMMUNITY): Payer: Medicare Other

## 2020-09-10 ENCOUNTER — Inpatient Hospital Stay (HOSPITAL_COMMUNITY): Payer: Medicare Other

## 2020-09-10 ENCOUNTER — Other Ambulatory Visit: Payer: Self-pay

## 2020-09-10 ENCOUNTER — Encounter (HOSPITAL_COMMUNITY): Payer: Self-pay

## 2020-09-10 ENCOUNTER — Inpatient Hospital Stay (HOSPITAL_COMMUNITY)
Admission: EM | Admit: 2020-09-10 | Discharge: 2020-10-06 | DRG: 003 | Disposition: A | Discharge status: Rehabilitation Facility | Payer: Medicare Other | Attending: Internal Medicine | Admitting: Internal Medicine

## 2020-09-10 DIAGNOSIS — I4891 Unspecified atrial fibrillation: Secondary | ICD-10-CM | POA: Diagnosis not present

## 2020-09-10 DIAGNOSIS — J9601 Acute respiratory failure with hypoxia: Secondary | ICD-10-CM | POA: Diagnosis not present

## 2020-09-10 DIAGNOSIS — I4819 Other persistent atrial fibrillation: Secondary | ICD-10-CM | POA: Diagnosis not present

## 2020-09-10 DIAGNOSIS — Y92019 Unspecified place in single-family (private) house as the place of occurrence of the external cause: Secondary | ICD-10-CM

## 2020-09-10 DIAGNOSIS — J9620 Acute and chronic respiratory failure, unspecified whether with hypoxia or hypercapnia: Secondary | ICD-10-CM | POA: Diagnosis present

## 2020-09-10 DIAGNOSIS — Z20822 Contact with and (suspected) exposure to covid-19: Secondary | ICD-10-CM | POA: Diagnosis present

## 2020-09-10 DIAGNOSIS — M545 Low back pain, unspecified: Secondary | ICD-10-CM | POA: Diagnosis not present

## 2020-09-10 DIAGNOSIS — M810 Age-related osteoporosis without current pathological fracture: Secondary | ICD-10-CM | POA: Diagnosis present

## 2020-09-10 DIAGNOSIS — E46 Unspecified protein-calorie malnutrition: Secondary | ICD-10-CM | POA: Diagnosis present

## 2020-09-10 DIAGNOSIS — R195 Other fecal abnormalities: Secondary | ICD-10-CM | POA: Diagnosis not present

## 2020-09-10 DIAGNOSIS — J9622 Acute and chronic respiratory failure with hypercapnia: Secondary | ICD-10-CM | POA: Diagnosis present

## 2020-09-10 DIAGNOSIS — G479 Sleep disorder, unspecified: Secondary | ICD-10-CM | POA: Diagnosis not present

## 2020-09-10 DIAGNOSIS — J95851 Ventilator associated pneumonia: Secondary | ICD-10-CM | POA: Diagnosis not present

## 2020-09-10 DIAGNOSIS — Z902 Acquired absence of lung [part of]: Secondary | ICD-10-CM

## 2020-09-10 DIAGNOSIS — I480 Paroxysmal atrial fibrillation: Secondary | ICD-10-CM | POA: Diagnosis present

## 2020-09-10 DIAGNOSIS — S22088D Other fracture of T11-T12 vertebra, subsequent encounter for fracture with routine healing: Secondary | ICD-10-CM | POA: Diagnosis not present

## 2020-09-10 DIAGNOSIS — Z7901 Long term (current) use of anticoagulants: Secondary | ICD-10-CM

## 2020-09-10 DIAGNOSIS — Z85118 Personal history of other malignant neoplasm of bronchus and lung: Secondary | ICD-10-CM

## 2020-09-10 DIAGNOSIS — E871 Hypo-osmolality and hyponatremia: Secondary | ICD-10-CM | POA: Diagnosis not present

## 2020-09-10 DIAGNOSIS — G8929 Other chronic pain: Secondary | ICD-10-CM | POA: Diagnosis not present

## 2020-09-10 DIAGNOSIS — R0689 Other abnormalities of breathing: Secondary | ICD-10-CM

## 2020-09-10 DIAGNOSIS — S2232XA Fracture of one rib, left side, initial encounter for closed fracture: Secondary | ICD-10-CM | POA: Diagnosis present

## 2020-09-10 DIAGNOSIS — Z85828 Personal history of other malignant neoplasm of skin: Secondary | ICD-10-CM

## 2020-09-10 DIAGNOSIS — F419 Anxiety disorder, unspecified: Secondary | ICD-10-CM | POA: Diagnosis present

## 2020-09-10 DIAGNOSIS — Y848 Other medical procedures as the cause of abnormal reaction of the patient, or of later complication, without mention of misadventure at the time of the procedure: Secondary | ICD-10-CM | POA: Diagnosis not present

## 2020-09-10 DIAGNOSIS — Z4659 Encounter for fitting and adjustment of other gastrointestinal appliance and device: Secondary | ICD-10-CM

## 2020-09-10 DIAGNOSIS — S22080A Wedge compression fracture of T11-T12 vertebra, initial encounter for closed fracture: Secondary | ICD-10-CM | POA: Diagnosis not present

## 2020-09-10 DIAGNOSIS — Z87891 Personal history of nicotine dependence: Secondary | ICD-10-CM

## 2020-09-10 DIAGNOSIS — Z419 Encounter for procedure for purposes other than remedying health state, unspecified: Secondary | ICD-10-CM

## 2020-09-10 DIAGNOSIS — N1831 Chronic kidney disease, stage 3a: Secondary | ICD-10-CM | POA: Diagnosis present

## 2020-09-10 DIAGNOSIS — R0602 Shortness of breath: Secondary | ICD-10-CM

## 2020-09-10 DIAGNOSIS — S22000D Wedge compression fracture of unspecified thoracic vertebra, subsequent encounter for fracture with routine healing: Secondary | ICD-10-CM | POA: Diagnosis not present

## 2020-09-10 DIAGNOSIS — R5381 Other malaise: Secondary | ICD-10-CM | POA: Diagnosis not present

## 2020-09-10 DIAGNOSIS — Z859 Personal history of malignant neoplasm, unspecified: Secondary | ICD-10-CM | POA: Diagnosis not present

## 2020-09-10 DIAGNOSIS — T8119XA Other postprocedural shock, initial encounter: Secondary | ICD-10-CM | POA: Diagnosis not present

## 2020-09-10 DIAGNOSIS — R339 Retention of urine, unspecified: Secondary | ICD-10-CM | POA: Diagnosis not present

## 2020-09-10 DIAGNOSIS — Z794 Long term (current) use of insulin: Secondary | ICD-10-CM | POA: Diagnosis not present

## 2020-09-10 DIAGNOSIS — S279XXA Injury of unspecified intrathoracic organ, initial encounter: Secondary | ICD-10-CM | POA: Diagnosis present

## 2020-09-10 DIAGNOSIS — I252 Old myocardial infarction: Secondary | ICD-10-CM

## 2020-09-10 DIAGNOSIS — Z8546 Personal history of malignant neoplasm of prostate: Secondary | ICD-10-CM

## 2020-09-10 DIAGNOSIS — E890 Postprocedural hypothyroidism: Secondary | ICD-10-CM | POA: Diagnosis present

## 2020-09-10 DIAGNOSIS — G9341 Metabolic encephalopathy: Secondary | ICD-10-CM | POA: Diagnosis not present

## 2020-09-10 DIAGNOSIS — J690 Pneumonitis due to inhalation of food and vomit: Secondary | ICD-10-CM | POA: Diagnosis not present

## 2020-09-10 DIAGNOSIS — Z01818 Encounter for other preprocedural examination: Secondary | ICD-10-CM

## 2020-09-10 DIAGNOSIS — J439 Emphysema, unspecified: Secondary | ICD-10-CM | POA: Diagnosis present

## 2020-09-10 DIAGNOSIS — R1312 Dysphagia, oropharyngeal phase: Secondary | ICD-10-CM | POA: Diagnosis not present

## 2020-09-10 DIAGNOSIS — M109 Gout, unspecified: Secondary | ICD-10-CM | POA: Diagnosis present

## 2020-09-10 DIAGNOSIS — S22000A Wedge compression fracture of unspecified thoracic vertebra, initial encounter for closed fracture: Secondary | ICD-10-CM | POA: Diagnosis present

## 2020-09-10 DIAGNOSIS — L89121 Pressure ulcer of left upper back, stage 1: Secondary | ICD-10-CM | POA: Diagnosis present

## 2020-09-10 DIAGNOSIS — N179 Acute kidney failure, unspecified: Secondary | ICD-10-CM | POA: Diagnosis not present

## 2020-09-10 DIAGNOSIS — Z930 Tracheostomy status: Secondary | ICD-10-CM

## 2020-09-10 DIAGNOSIS — W010XXA Fall on same level from slipping, tripping and stumbling without subsequent striking against object, initial encounter: Secondary | ICD-10-CM | POA: Diagnosis present

## 2020-09-10 DIAGNOSIS — S22009A Unspecified fracture of unspecified thoracic vertebra, initial encounter for closed fracture: Secondary | ICD-10-CM

## 2020-09-10 DIAGNOSIS — S22080G Wedge compression fracture of T11-T12 vertebra, subsequent encounter for fracture with delayed healing: Secondary | ICD-10-CM | POA: Diagnosis not present

## 2020-09-10 DIAGNOSIS — Z85850 Personal history of malignant neoplasm of thyroid: Secondary | ICD-10-CM

## 2020-09-10 DIAGNOSIS — Z79899 Other long term (current) drug therapy: Secondary | ICD-10-CM

## 2020-09-10 DIAGNOSIS — K5080 Crohn's disease of both small and large intestine without complications: Secondary | ICD-10-CM | POA: Diagnosis present

## 2020-09-10 DIAGNOSIS — J9602 Acute respiratory failure with hypercapnia: Secondary | ICD-10-CM

## 2020-09-10 DIAGNOSIS — R579 Shock, unspecified: Secondary | ICD-10-CM | POA: Diagnosis not present

## 2020-09-10 DIAGNOSIS — S22009S Unspecified fracture of unspecified thoracic vertebra, sequela: Secondary | ICD-10-CM | POA: Diagnosis not present

## 2020-09-10 DIAGNOSIS — J9621 Acute and chronic respiratory failure with hypoxia: Secondary | ICD-10-CM | POA: Diagnosis present

## 2020-09-10 DIAGNOSIS — L8990 Pressure ulcer of unspecified site, unspecified stage: Secondary | ICD-10-CM | POA: Insufficient documentation

## 2020-09-10 DIAGNOSIS — J449 Chronic obstructive pulmonary disease, unspecified: Secondary | ICD-10-CM | POA: Diagnosis not present

## 2020-09-10 DIAGNOSIS — S22088A Other fracture of T11-T12 vertebra, initial encounter for closed fracture: Secondary | ICD-10-CM | POA: Diagnosis present

## 2020-09-10 DIAGNOSIS — Z79890 Hormone replacement therapy (postmenopausal): Secondary | ICD-10-CM

## 2020-09-10 DIAGNOSIS — R197 Diarrhea, unspecified: Secondary | ICD-10-CM | POA: Diagnosis not present

## 2020-09-10 DIAGNOSIS — I959 Hypotension, unspecified: Secondary | ICD-10-CM | POA: Diagnosis not present

## 2020-09-10 DIAGNOSIS — I129 Hypertensive chronic kidney disease with stage 1 through stage 4 chronic kidney disease, or unspecified chronic kidney disease: Secondary | ICD-10-CM | POA: Diagnosis present

## 2020-09-10 DIAGNOSIS — M459 Ankylosing spondylitis of unspecified sites in spine: Secondary | ICD-10-CM | POA: Diagnosis present

## 2020-09-10 DIAGNOSIS — E785 Hyperlipidemia, unspecified: Secondary | ICD-10-CM | POA: Diagnosis present

## 2020-09-10 DIAGNOSIS — E861 Hypovolemia: Secondary | ICD-10-CM | POA: Diagnosis not present

## 2020-09-10 DIAGNOSIS — D62 Acute posthemorrhagic anemia: Secondary | ICD-10-CM | POA: Diagnosis not present

## 2020-09-10 DIAGNOSIS — R54 Age-related physical debility: Secondary | ICD-10-CM | POA: Diagnosis present

## 2020-09-10 DIAGNOSIS — G9340 Encephalopathy, unspecified: Secondary | ICD-10-CM

## 2020-09-10 DIAGNOSIS — D6489 Other specified anemias: Secondary | ICD-10-CM | POA: Diagnosis not present

## 2020-09-10 DIAGNOSIS — J942 Hemothorax: Secondary | ICD-10-CM | POA: Diagnosis present

## 2020-09-10 DIAGNOSIS — J9610 Chronic respiratory failure, unspecified whether with hypoxia or hypercapnia: Secondary | ICD-10-CM | POA: Diagnosis not present

## 2020-09-10 DIAGNOSIS — R918 Other nonspecific abnormal finding of lung field: Secondary | ICD-10-CM | POA: Diagnosis not present

## 2020-09-10 DIAGNOSIS — T17908A Unspecified foreign body in respiratory tract, part unspecified causing other injury, initial encounter: Secondary | ICD-10-CM | POA: Diagnosis not present

## 2020-09-10 DIAGNOSIS — J150 Pneumonia due to Klebsiella pneumoniae: Secondary | ICD-10-CM | POA: Diagnosis not present

## 2020-09-10 DIAGNOSIS — R069 Unspecified abnormalities of breathing: Secondary | ICD-10-CM | POA: Diagnosis not present

## 2020-09-10 DIAGNOSIS — R1314 Dysphagia, pharyngoesophageal phase: Secondary | ICD-10-CM | POA: Diagnosis not present

## 2020-09-10 DIAGNOSIS — Z789 Other specified health status: Secondary | ICD-10-CM | POA: Diagnosis not present

## 2020-09-10 DIAGNOSIS — K591 Functional diarrhea: Secondary | ICD-10-CM | POA: Diagnosis not present

## 2020-09-10 DIAGNOSIS — R0902 Hypoxemia: Secondary | ICD-10-CM | POA: Diagnosis not present

## 2020-09-10 DIAGNOSIS — Z7189 Other specified counseling: Secondary | ICD-10-CM

## 2020-09-10 DIAGNOSIS — R1310 Dysphagia, unspecified: Secondary | ICD-10-CM | POA: Diagnosis not present

## 2020-09-10 DIAGNOSIS — Z9911 Dependence on respirator [ventilator] status: Secondary | ICD-10-CM | POA: Diagnosis not present

## 2020-09-10 DIAGNOSIS — S271XXA Traumatic hemothorax, initial encounter: Principal | ICD-10-CM | POA: Diagnosis present

## 2020-09-10 DIAGNOSIS — L409 Psoriasis, unspecified: Secondary | ICD-10-CM | POA: Diagnosis present

## 2020-09-10 DIAGNOSIS — Z978 Presence of other specified devices: Secondary | ICD-10-CM

## 2020-09-10 DIAGNOSIS — S22080D Wedge compression fracture of T11-T12 vertebra, subsequent encounter for fracture with routine healing: Secondary | ICD-10-CM | POA: Diagnosis not present

## 2020-09-10 DIAGNOSIS — E119 Type 2 diabetes mellitus without complications: Secondary | ICD-10-CM

## 2020-09-10 DIAGNOSIS — Z6826 Body mass index (BMI) 26.0-26.9, adult: Secondary | ICD-10-CM

## 2020-09-10 DIAGNOSIS — J9600 Acute respiratory failure, unspecified whether with hypoxia or hypercapnia: Secondary | ICD-10-CM

## 2020-09-10 DIAGNOSIS — E1165 Type 2 diabetes mellitus with hyperglycemia: Secondary | ICD-10-CM | POA: Diagnosis not present

## 2020-09-10 DIAGNOSIS — Z981 Arthrodesis status: Secondary | ICD-10-CM

## 2020-09-10 DIAGNOSIS — L89111 Pressure ulcer of right upper back, stage 1: Secondary | ICD-10-CM | POA: Diagnosis present

## 2020-09-10 DIAGNOSIS — E1065 Type 1 diabetes mellitus with hyperglycemia: Secondary | ICD-10-CM | POA: Diagnosis not present

## 2020-09-10 DIAGNOSIS — M419 Scoliosis, unspecified: Secondary | ICD-10-CM | POA: Diagnosis present

## 2020-09-10 DIAGNOSIS — Z8249 Family history of ischemic heart disease and other diseases of the circulatory system: Secondary | ICD-10-CM

## 2020-09-10 DIAGNOSIS — E875 Hyperkalemia: Secondary | ICD-10-CM | POA: Diagnosis not present

## 2020-09-10 DIAGNOSIS — J189 Pneumonia, unspecified organism: Secondary | ICD-10-CM

## 2020-09-10 DIAGNOSIS — E876 Hypokalemia: Secondary | ICD-10-CM | POA: Diagnosis not present

## 2020-09-10 DIAGNOSIS — S22080S Wedge compression fracture of T11-T12 vertebra, sequela: Secondary | ICD-10-CM | POA: Diagnosis not present

## 2020-09-10 LAB — CBC WITH DIFFERENTIAL/PLATELET
Abs Immature Granulocytes: 0.05 10*3/uL (ref 0.00–0.07)
Basophils Absolute: 0 10*3/uL (ref 0.0–0.1)
Basophils Relative: 0 %
Eosinophils Absolute: 0.1 10*3/uL (ref 0.0–0.5)
Eosinophils Relative: 0 %
HCT: 41.8 % (ref 39.0–52.0)
Hemoglobin: 12.8 g/dL — ABNORMAL LOW (ref 13.0–17.0)
Immature Granulocytes: 0 %
Lymphocytes Relative: 6 %
Lymphs Abs: 0.8 10*3/uL (ref 0.7–4.0)
MCH: 29.6 pg (ref 26.0–34.0)
MCHC: 30.6 g/dL (ref 30.0–36.0)
MCV: 96.8 fL (ref 80.0–100.0)
Monocytes Absolute: 0.9 10*3/uL (ref 0.1–1.0)
Monocytes Relative: 6 %
Neutro Abs: 12.3 10*3/uL — ABNORMAL HIGH (ref 1.7–7.7)
Neutrophils Relative %: 88 %
Platelets: 251 10*3/uL (ref 150–400)
RBC: 4.32 MIL/uL (ref 4.22–5.81)
RDW: 13.9 % (ref 11.5–15.5)
WBC: 14.1 10*3/uL — ABNORMAL HIGH (ref 4.0–10.5)
nRBC: 0 % (ref 0.0–0.2)

## 2020-09-10 LAB — COMPREHENSIVE METABOLIC PANEL
ALT: 21 U/L (ref 0–44)
AST: 24 U/L (ref 15–41)
Albumin: 3.6 g/dL (ref 3.5–5.0)
Alkaline Phosphatase: 66 U/L (ref 38–126)
Anion gap: 9 (ref 5–15)
BUN: 16 mg/dL (ref 8–23)
CO2: 36 mmol/L — ABNORMAL HIGH (ref 22–32)
Calcium: 8.6 mg/dL — ABNORMAL LOW (ref 8.9–10.3)
Chloride: 90 mmol/L — ABNORMAL LOW (ref 98–111)
Creatinine, Ser: 1.15 mg/dL (ref 0.61–1.24)
GFR, Estimated: >60 mL/min (ref >60)
Glucose, Bld: 189 mg/dL — ABNORMAL HIGH (ref 70–99)
Potassium: 3.7 mmol/L (ref 3.5–5.1)
Sodium: 135 mmol/L (ref 135–145)
Total Bilirubin: 0.8 mg/dL (ref 0.3–1.2)
Total Protein: 6.7 g/dL (ref 6.5–8.1)

## 2020-09-10 LAB — BLOOD GAS, ARTERIAL
Acid-Base Excess: 9.7 mmol/L — ABNORMAL HIGH (ref 0.0–2.0)
Bicarbonate: 32.3 mmol/L — ABNORMAL HIGH (ref 20.0–28.0)
O2 Saturation: 98.6 %
Patient temperature: 98.6
pCO2 arterial: 35.2 mmHg (ref 32.0–48.0)
pH, Arterial: 7.57 — ABNORMAL HIGH (ref 7.350–7.450)
pO2, Arterial: 148 mmHg — ABNORMAL HIGH (ref 83.0–108.0)

## 2020-09-10 LAB — BLOOD GAS, VENOUS
Acid-Base Excess: 2.8 mmol/L — ABNORMAL HIGH (ref 0.0–2.0)
Bicarbonate: 36.5 mmol/L — ABNORMAL HIGH (ref 20.0–28.0)
O2 Saturation: 93 %
Patient temperature: 98.6
pCO2, Ven: >120 mmHg (ref 44.0–60.0)
pH, Ven: 7.104 — CL (ref 7.250–7.430)
pO2, Ven: 95.6 mmHg — ABNORMAL HIGH (ref 32.0–45.0)

## 2020-09-10 LAB — LACTIC ACID, PLASMA
Lactic Acid, Venous: 1 mmol/L (ref 0.5–1.9)
Lactic Acid, Venous: 2.7 mmol/L (ref 0.5–1.9)

## 2020-09-10 LAB — RESP PANEL BY RT-PCR (FLU A&B, COVID) ARPGX2
Influenza A by PCR: NEGATIVE
Influenza B by PCR: NEGATIVE
SARS Coronavirus 2 by RT PCR: NEGATIVE

## 2020-09-10 LAB — MRSA PCR SCREENING: MRSA by PCR: NEGATIVE

## 2020-09-10 LAB — GLUCOSE, CAPILLARY
Glucose-Capillary: 225 mg/dL — ABNORMAL HIGH (ref 70–99)
Glucose-Capillary: 135 mg/dL — ABNORMAL HIGH (ref 70–99)

## 2020-09-10 LAB — LIPASE, BLOOD: Lipase: 21 U/L (ref 11–51)

## 2020-09-10 LAB — HEMOGLOBIN A1C
Hgb A1c MFr Bld: 6.4 % — ABNORMAL HIGH (ref 4.8–5.6)
Mean Plasma Glucose: 136.98 mg/dL

## 2020-09-10 MED ORDER — INSULIN ASPART 100 UNIT/ML ~~LOC~~ SOLN
0.0000 [IU] | SUBCUTANEOUS | Status: DC
  Administered 2020-09-10: 3 [IU] via SUBCUTANEOUS
  Administered 2020-09-10 – 2020-09-11 (×2): 1 [IU] via SUBCUTANEOUS
  Administered 2020-09-12: 2 [IU] via SUBCUTANEOUS
  Administered 2020-09-12: 1 [IU] via SUBCUTANEOUS
  Administered 2020-09-12 (×2): 2 [IU] via SUBCUTANEOUS
  Administered 2020-09-12: 1 [IU] via SUBCUTANEOUS
  Administered 2020-09-12 (×2): 2 [IU] via SUBCUTANEOUS
  Administered 2020-09-13: 1 [IU] via SUBCUTANEOUS
  Administered 2020-09-13 (×2): 2 [IU] via SUBCUTANEOUS
  Administered 2020-09-13 – 2020-09-14 (×3): 5 [IU] via SUBCUTANEOUS
  Administered 2020-09-14 (×2): 3 [IU] via SUBCUTANEOUS
  Administered 2020-09-14: 2 [IU] via SUBCUTANEOUS
  Administered 2020-09-14: 3 [IU] via SUBCUTANEOUS
  Administered 2020-09-14: 2 [IU] via SUBCUTANEOUS
  Administered 2020-09-15: 3 [IU] via SUBCUTANEOUS
  Administered 2020-09-15: 2 [IU] via SUBCUTANEOUS
  Administered 2020-09-15: 3 [IU] via SUBCUTANEOUS
  Administered 2020-09-15: 5 [IU] via SUBCUTANEOUS
  Administered 2020-09-15 – 2020-09-16 (×2): 3 [IU] via SUBCUTANEOUS
  Filled 2020-09-10: qty 0.09

## 2020-09-10 MED ORDER — SODIUM CHLORIDE 0.9 % IV BOLUS
1000.0000 mL | Freq: Once | INTRAVENOUS | Status: AC
  Administered 2020-09-10: 1000 mL via INTRAVENOUS

## 2020-09-10 MED ORDER — METRONIDAZOLE IN NACL 5-0.79 MG/ML-% IV SOLN: 500.0000 mg | Freq: Once | INTRAVENOUS | Status: DC

## 2020-09-10 MED ORDER — NALOXONE HCL 0.4 MG/ML IJ SOLN
INTRAMUSCULAR | Status: AC
  Filled 2020-09-10: qty 1

## 2020-09-10 MED ORDER — MORPHINE SULFATE (PF) 4 MG/ML IV SOLN
4.0000 mg | Freq: Once | INTRAVENOUS | Status: AC
  Administered 2020-09-10: 4 mg via INTRAVENOUS
  Filled 2020-09-10: qty 1

## 2020-09-10 MED ORDER — PANTOPRAZOLE SODIUM 40 MG IV SOLR
40.0000 mg | Freq: Every day | INTRAVENOUS | Status: DC
  Administered 2020-09-10: 40 mg via INTRAVENOUS
  Filled 2020-09-10: qty 40

## 2020-09-10 MED ORDER — MIDAZOLAM 50MG/50ML (1MG/ML) PREMIX INFUSION
0.0000 mg/h | INTRAVENOUS | Status: DC
  Administered 2020-09-10: 1 mg/h via INTRAVENOUS
  Administered 2020-09-11: 2.5 mg/h via INTRAVENOUS
  Filled 2020-09-10 (×3): qty 50

## 2020-09-10 MED ORDER — IPRATROPIUM-ALBUTEROL 0.5-2.5 (3) MG/3ML IN SOLN: 3.0000 mL | Freq: Once | RESPIRATORY_TRACT | Status: DC

## 2020-09-10 MED ORDER — HYDROMORPHONE HCL 1 MG/ML IJ SOLN
1.0000 mg | Freq: Once | INTRAMUSCULAR | Status: DC
  Filled 2020-09-10: qty 1

## 2020-09-10 MED ORDER — FENTANYL 2500MCG IN NS 250ML (10MCG/ML) PREMIX INFUSION
0.0000 ug/h | INTRAVENOUS | Status: DC
  Administered 2020-09-10: 25 ug/h via INTRAVENOUS
  Administered 2020-09-10: 50 ug/h via INTRAVENOUS
  Administered 2020-09-11: 175 ug/h via INTRAVENOUS
  Administered 2020-09-11: 200 ug/h via INTRAVENOUS
  Administered 2020-09-11: 275 ug/h via INTRAVENOUS
  Administered 2020-09-12: 325 ug/h via INTRAVENOUS
  Administered 2020-09-12: 300 ug/h via INTRAVENOUS
  Administered 2020-09-12: 375 ug/h via INTRAVENOUS
  Filled 2020-09-10 (×7): qty 250

## 2020-09-10 MED ORDER — POLYETHYLENE GLYCOL 3350 17 G PO PACK: 17.0000 g | PACK | Freq: Every day | ORAL | Status: DC | PRN

## 2020-09-10 MED ORDER — LORAZEPAM 2 MG/ML IJ SOLN
0.5000 mg | Freq: Once | INTRAMUSCULAR | Status: AC
  Administered 2020-09-10: 0.5 mg via INTRAVENOUS
  Filled 2020-09-10: qty 1

## 2020-09-10 MED ORDER — IOHEXOL 350 MG/ML SOLN
100.0000 mL | Freq: Once | INTRAVENOUS | Status: AC | PRN
  Administered 2020-09-10: 100 mL via INTRAVENOUS

## 2020-09-10 MED ORDER — METOCLOPRAMIDE HCL 5 MG/ML IJ SOLN: 10.0000 mg | Freq: Once | INTRAMUSCULAR | Status: DC

## 2020-09-10 MED ORDER — VANCOMYCIN HCL IN DEXTROSE 1-5 GM/200ML-% IV SOLN
1000.0000 mg | Freq: Once | INTRAVENOUS | Status: DC
Start: 2020-09-10 — End: 2020-09-10
  Administered 2020-09-10: 1000 mg via INTRAVENOUS
  Filled 2020-09-10: qty 200

## 2020-09-10 MED ORDER — FENTANYL CITRATE (PF) 100 MCG/2ML IJ SOLN
25.0000 ug | INTRAMUSCULAR | Status: DC | PRN
Start: 2020-09-10 — End: 2020-09-18
  Administered 2020-09-11: 200 ug via INTRAVENOUS
  Administered 2020-09-11: 100 ug via INTRAVENOUS
  Administered 2020-09-11: 200 ug via INTRAVENOUS
  Administered 2020-09-11: 100 ug via INTRAVENOUS
  Administered 2020-09-12: 200 ug via INTRAVENOUS
  Administered 2020-09-14 – 2020-09-15 (×4): 100 ug via INTRAVENOUS
  Administered 2020-09-16: 50 ug via INTRAVENOUS
  Administered 2020-09-16 – 2020-09-17 (×9): 100 ug via INTRAVENOUS
  Filled 2020-09-10 (×14): qty 2

## 2020-09-10 MED ORDER — ONDANSETRON HCL 4 MG/2ML IJ SOLN
4.0000 mg | Freq: Once | INTRAMUSCULAR | Status: AC
  Administered 2020-09-10: 4 mg via INTRAVENOUS
  Filled 2020-09-10: qty 2

## 2020-09-10 MED ORDER — CHLORHEXIDINE GLUCONATE CLOTH 2 % EX PADS
6.0000 | MEDICATED_PAD | Freq: Every day | CUTANEOUS | Status: DC
  Administered 2020-09-10 – 2020-09-18 (×9): 6 via TOPICAL

## 2020-09-10 MED ORDER — ETOMIDATE 2 MG/ML IV SOLN
INTRAVENOUS | Status: AC
  Filled 2020-09-10: qty 20

## 2020-09-10 MED ORDER — NALOXONE HCL 2 MG/2ML IJ SOSY
PREFILLED_SYRINGE | INTRAMUSCULAR | Status: AC
  Filled 2020-09-10: qty 2

## 2020-09-10 MED ORDER — ROCURONIUM BROMIDE 10 MG/ML (PF) SYRINGE
PREFILLED_SYRINGE | INTRAVENOUS | Status: AC
  Filled 2020-09-10: qty 10

## 2020-09-10 MED ORDER — SODIUM CHLORIDE 0.9% FLUSH: 10.0000 mL | INTRAVENOUS | Status: DC | PRN

## 2020-09-10 MED ORDER — HYDROMORPHONE HCL 1 MG/ML IJ SOLN: 0.5000 mg | Freq: Once | INTRAMUSCULAR | Status: DC

## 2020-09-10 MED ORDER — FENTANYL CITRATE (PF) 100 MCG/2ML IJ SOLN
50.0000 ug | Freq: Once | INTRAMUSCULAR | Status: AC
  Administered 2020-09-10: 50 ug via INTRAVENOUS
  Filled 2020-09-10: qty 2

## 2020-09-10 MED ORDER — ORAL CARE MOUTH RINSE
15.0000 mL | OROMUCOSAL | Status: DC
  Administered 2020-09-10 – 2020-10-06 (×221): 15 mL via OROMUCOSAL

## 2020-09-10 MED ORDER — CHLORHEXIDINE GLUCONATE 0.12% ORAL RINSE (MEDLINE KIT)
15.0000 mL | Freq: Two times a day (BID) | OROMUCOSAL | Status: DC
  Administered 2020-09-10 – 2020-10-01 (×39): 15 mL via OROMUCOSAL

## 2020-09-10 MED ORDER — DOCUSATE SODIUM 100 MG PO CAPS: 100.0000 mg | ORAL_CAPSULE | Freq: Two times a day (BID) | ORAL | Status: DC | PRN

## 2020-09-10 MED ORDER — PROPOFOL 500 MG/50ML IV EMUL
INTRAVENOUS | Status: AC
  Filled 2020-09-10: qty 50

## 2020-09-10 MED ORDER — SODIUM CHLORIDE 0.9 % IV SOLN: 2.0000 g | Freq: Once | INTRAVENOUS | Status: DC

## 2020-09-10 MED ORDER — SODIUM CHLORIDE 0.45 % IV SOLN: INTRAVENOUS | Status: DC

## 2020-09-10 MED ORDER — SODIUM CHLORIDE 0.9% FLUSH
10.0000 mL | Freq: Two times a day (BID) | INTRAVENOUS | Status: DC
  Administered 2020-09-10 – 2020-09-15 (×10): 10 mL
  Administered 2020-09-16: 40 mL
  Administered 2020-09-16 – 2020-09-17 (×2): 10 mL
  Administered 2020-09-17: 20 mL
  Administered 2020-09-18: 10 mL

## 2020-09-10 NOTE — Progress Notes (Signed)
Orthopedic Tech Progress Note Patient Details:  Devin [name] [birth year] [id number]  Patient ID: Devin [name], male   DOB: 1941-06-06, 80 y.o.   MRN: [id number]   [name] 09/10/2020, 5:56 PM Rn called [employer] for brace per discussion.

## 2020-09-10 NOTE — ED Notes (Signed)
Critical Care called for MD, Trifan@12 :31pm.

## 2020-09-10 NOTE — ED Provider Notes (Signed)
[facility] DEPT Provider Note   CSN: [id number] Arrival date & time: 09/10/20  0944     History Chief Complaint  Patient presents with  . Back Pain  . Vomiting    Devin [name] is a 80 y.o. male.  HPI   80 y/o male with a h/o afib, adenocarcinoma of the left lung, ankylosing spondylitis, crohn's disease, nephrolithiasis, HTN, DM, obesity, osteoporosis, prostate CA, psoriasis, thyroid CA, recently dx COPD, who presents to the ED today for eval of back pain. Pt had a fall 4 days ago after tripping and falling. He denies any head trauma or loc. At that time he was seen by his PCP where he underwent thoracic spine x-ray, lumbar spine x-ray and chest x-ray of the left ribs.  Per paperwork that was brought by daughter at bedside, patient has new compression fractures at T11, T12.  He also has a stable L1 compression fracture, extensive lumbar spondylosis and postsurgical changes.  Additionally has a left seventh rib fracture anteriorly and a left pleural effusion without any obvious pneumothorax.  Since patient fell he has been having increased back pain.  He has also been nauseated and having dry heaves.  He has not vomited.  He denies any abdominal pain but has had some constipation.  Denies urinary symptoms, fevers, chest pain.  He has had some increased shortness of breath from his baseline but denies any increased cough.  He states that he is not on chemotherapy at this time.  States that he has not been using an incentive spirometer since being diagnosed with his rib fracture.  He was seen by a provider 2 days ago who gave him a prescription for hydrocodone and Robaxin.  He also had labs completed at that time which showed a leukocytosis of 18,000 so he was started on Augmentin.  Per daughter at bedside, there was no clear source of infection when patient was started on this antibiotic.  Oncologist: Dr. Earlie Server  Past Medical History:  Diagnosis Date  .  A-fib ([place])   . Adenocarcinoma of left lung, stage 1 ([place]) 03/10/2015  . Ankylosing spondylitis ([place])    Diagnosed during lumbar fracture summer of 2016    . Crohn's disease ([place])   . Gout   . HIstory of basal cell cancer of face    THYROID CA HX  . History of kidney stones   . Hypertension   . Hypocalcemia 04/13/2007  . Hypothyroidism   . Impotence   . 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 Obesity (BMI 30-39.9)   . Osteoporosis    Pt completed 5 years of fosamax in 2013     . Prostate cancer with recurrence    Treated with prostatectomy with recurrence 2012 with Lupron treatment now him   . Psoriasis   . Thyroid cancer ([place]) 1999   Treated with RAI and total thyroidectomy   . Type II diabetes mellitus [facility])     Patient Active Problem List   Diagnosis Date Noted  . Left rib fracture 09/10/2020  . Hemothorax on left 09/10/2020  . Acute on chronic respiratory failure with hypoxia and hypercapnia ([place]) 09/10/2020  . S/P cystoscopy 07/03/2020  . Respiratory failure ([place]) 07/03/2020  . Acute and chronic respiratory failure, unspecified whether with hypoxia or hypercapnia ([place]) 07/03/2020  . Paroxysmal atrial fibrillation ([place]) 03/30/2020  . Secondary hypercoagulable state ([place]) 03/30/2020  . Orthostatic hypotension 02/07/2020  . Dyspnea 01/17/2020  . NSTEMI (non-ST elevated myocardial  infarction) ([place]) 01/04/2020  . Crohn's ileocolitis ([place]) 02/21/2017  . Hyperlipidemia 02/21/2017  . Hyperuricemia 02/21/2017  . 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 Thyroid cancer ([place]) 02/21/2017  . Cholecystitis with cholelithiasis 05/07/2016  . Cholecystitis 03/14/2016  . Abdominal pain 03/05/2016  . Malnutrition of moderate degree 02/10/2016  . Thoracic compression fracture ([place]) 02/08/2016  . Back pain 01/26/2016  . Hip pain 01/26/2016  . S/P lobectomy of lung 03/16/2015  . Adenocarcinoma of left lung, stage 1 ([place]) 03/10/2015  . Diabetic neuropathy ([place])   .  Ankylosing spondylitis ([place])   . Obesity (BMI 30-39.9)   . Crohn's disease ([place])   . Lung nodule 02/23/2015  . HIstory of basal cell cancer of face   . History of thyroid cancer   . Postsurgical hypothyroidism   . Gout 12/29/2006  . Essential hypertension 12/29/2006  . Osteoporosis 12/29/2006  . DM (diabetes mellitus), type 2 with renal complications ([place])   . Prostate cancer with recurrence     Past Surgical History:  Procedure Laterality Date  . ABDOMINAL EXPLORATION SURGERY     for small bowel obstruction  . APPENDECTOMY  03/2007  . BACK SURGERY    . BASAL CELL CARCINOMA EXCISION  "several"   "head"  . CARDIAC CATHETERIZATION  03/17/2003  . CHOLECYSTECTOMY N/A 05/07/2016   Procedure: LAPAROSCOPIC CHOLECYSTECTOMY;  Surgeon: Fanny Skates, MD;  Location: [facility];  Service: General;  Laterality: N/A;  . COLON SURGERY  03/2007   Resection of cecum, appendix, terminal ileum (approximately/notes 10/10/2010  . CYSTOSCOPY/URETEROSCOPY/HOLMIUM LASER/STENT PLACEMENT Right 07/03/2020   Procedure: CYSTOSCOPY/RETROGRADE/URETEROSCOPY REMOVAL OF BLADDER STONE;  Surgeon: Raynelle Bring, MD;  Location: [facility];  Service: Urology;  Laterality: Right;  . HERNIA REPAIR    . LAPAROSCOPIC CHOLECYSTECTOMY  05/07/2016  . LAPAROSCOPIC LYSIS OF ADHESIONS  05/07/2016  . LAPAROSCOPIC LYSIS OF ADHESIONS N/A 05/07/2016   Procedure: LAPAROSCOPIC LYSIS OF ADHESIONS TIMES ONE HOUR;  Surgeon: Fanny Skates, MD;  Location: [facility];  Service: General;  Laterality: N/A;  . POSTERIOR FUSION THORACIC SPINE  02/08/2016   1. Posterior thoracic arthrodesis T7-T11 utilizing morcellized allograft, 2. Posterior thoracic segmental fixation T7-T11 utilizing nuvasive pedicle screws  . PROSTATECTOMY  06/2001   w/bilateral pelvic lymph nose dissection/notes 10/24/2010  . SPINAL FUSION  12/2014   Open reduction internal fixation of L1 Chance fracture with posterior fusion T10-L4 utilizing morcellized allograft and some local  autograft, segmental instrumentation T10-L4 inclusive utilizing nuvasive pedicle screws/notes 12/16/2014  . Stress Cardiolite  02/17/2003  . THOROCOTOMY WITH LOBECTOMY  03/16/2015   Procedure: THOROCOTOMY WITH LOBECTOMY;  Surgeon: Ivin Poot, MD;  Location: [facility];  Service: Thoracic;;  . TONSILLECTOMY    . TOTAL THYROIDECTOMY  1997  . Venous Doppler  05/30/2004  . VENTRAL HERNIA REPAIR  04/14/2008  . VIDEO ASSISTED THORACOSCOPY Left 03/16/2015   Procedure: VIDEO ASSISTED THORACOSCOPY;  Surgeon: Ivin Poot, MD;  [facility];  Service: Thoracic;  Laterality: Left;       Family History  Problem Relation Age of Onset  . CAD Mother   . Cancer Neg Hx        No cancer in the patient's immediate family, except of course for the patient himself, as noted    Social History   Tobacco Use  . Smoking status: Former Smoker    Packs/day: 2.00    Years: 30.00    Pack years: 60.00    Types: Cigarettes    Quit date: 12/26/1989    Years since  quitting: 30.7  . Smokeless tobacco: Never Used  Vaping Use  . Vaping Use: Never used  Substance Use Topics  . Alcohol use: Not Currently    Alcohol/week: 0.0 standard drinks  . Drug use: No    Home Medications Prior to Admission medications   Medication Sig Start Date End Date Taking? Authorizing Provider  albuterol (VENTOLIN HFA) 108 (90 Base) MCG/ACT inhaler Inhale 2 puffs into the lungs every 6 (six) hours as needed for wheezing or shortness of breath. 08/31/20  Yes Brand Males, MD  ALPRAZolam Duanne Moron) 0.5 MG tablet Take 0.5 mg by mouth 2 (two) times daily as needed for anxiety. 02/29/20  Yes [provider]  amoxicillin-clavulanate (AUGMENTIN) 875-125 MG tablet Take 1 tablet by mouth every 12 (twelve) hours. 09/08/20  Yes [provider]  apixaban (ELIQUIS) 5 MG TABS tablet Take 5 mg by mouth 2 (two) times daily.   Yes [provider]  atenolol (TENORMIN) 25 MG tablet Take 1 tablet (25 mg total) by mouth 2  (two) times daily. 02/09/20  Yes Thurnell Lose, MD  busPIRone (BUSPAR) 15 MG tablet Take 15 mg by mouth 2 (two) times daily.  05/17/16  Yes [provider]  calcium-vitamin D (OSCAL WITH D) 500-200 MG-UNIT tablet Take 2 tablets by mouth daily.    Yes [provider]  hydrochlorothiazide (HYDRODIURIL) 12.5 MG tablet Take 12.5 mg by mouth every morning. 06/06/20  Yes [provider]  HYDROcodone-acetaminophen (NORCO/VICODIN) 5-325 MG tablet Take 1 tablet by mouth every 6 (six) hours as needed (pain). 09/08/20  Yes [provider]  insulin lispro (HUMALOG KWIKPEN) 100 UNIT/ML KiwkPen INJECT UNDER THE SKIN THREE TIMES DAILY( 25 UNITS, 25 UNITS AND 20 UNITS RESPECTIVELY BEFORE MEALS) Patient taking differently: Inject 20-30 Units into the skin See admin instructions. Inject 25 units subcutaneously before breakfast, 20 units before lunch and 30 units before supper 05/17/16  Yes Weber, Damaris Hippo, PA-C  levothyroxine (SYNTHROID) 125 MCG tablet Take 1 tablet (125 mcg total) by mouth daily before breakfast. 02/10/20  Yes Thurnell Lose, MD  Mesalamine 800 MG TBEC Take 1,600 mg by mouth 2 (two) times daily.   Yes [provider]  methocarbamol (ROBAXIN) 500 MG tablet Take 500 mg by mouth every 8 (eight) hours. 09/08/20  Yes [provider]  Multiple Vitamins-Iron (DAILY-VITE/IRON/BETA-CAROTENE) TABS Take 1 tablet by mouth daily.    Yes [provider]  Tiotropium Bromide Monohydrate (SPIRIVA RESPIMAT) 2.5 MCG/ACT AERS Inhale 2 puffs into the lungs daily. 08/31/20  Yes Brand Males, MD  zinc gluconate 50 MG tablet Take 50 mg by mouth daily.   Yes [provider]  B-D ULTRAFINE III SHORT PEN 31G X 8 MM MISC USE 1 PEN NEEDLE FOUR TIMES DAILY. 09/18/16   Ivar Drape D, PA  diltiazem (CARDIZEM) 30 MG tablet Take 1 tablet every 4 hours AS NEEDED for heart rate >100 as long as blood pressure >100. Patient not taking: No sig reported 03/30/20    Fenton, Clint R, PA  Glucose Blood (ASCENSIA CONTOUR TEST VI) Use as directed to test two times a day    [provider]  glucose blood (BAYER CONTOUR TEST) test strip TEST BLOOD SUGAR TWICE DAILY AS DIRECTED. 05/17/16   Gale Journey, Sarah L, PA-C  glucose blood test strip 1 each by Other route 2 times daily. 05/17/16   [provider]  Insulin Pen Needle 31G X 8 MM MISC Inject 31 g into the skin. 09/18/16   [provider]  leuprolide (LUPRON) 11.25 MG injection Inject 11.25 mg into the muscle every 6 (six) months.     [provider]    Allergies    Patient has no known allergies.  Review of Systems   Review of Systems  Constitutional: Negative for chills, diaphoresis and fever.  HENT: Negative for ear pain and sore throat.   Eyes: Negative for pain and visual disturbance.  Respiratory: Positive for cough and shortness of breath.   Cardiovascular: Negative for chest pain, palpitations and leg swelling.  Gastrointestinal: Positive for constipation, nausea and vomiting (no vomting, dry heaving). Negative for abdominal pain and diarrhea.  Genitourinary: Negative for dysuria and hematuria.       No loss of control of bowel or bladder function  Musculoskeletal: Positive for back pain.  Skin: Negative for color change and rash.  Neurological: Negative for weakness and numbness.  All other systems reviewed and are negative.   Physical Exam Updated Vital Signs BP 128/61 (BP Location: Right Arm)   Pulse 73   Temp 98.7 F (37.1 C) (Axillary)   Resp 18   SpO2 98%   Physical Exam Vitals and nursing note reviewed.  Constitutional:      Appearance: He is well-developed.  HENT:     Head: Normocephalic and atraumatic.     Mouth/Throat:     Mouth: Mucous membranes are dry.  Eyes:     Conjunctiva/sclera: Conjunctivae normal.  Cardiovascular:     Rate and Rhythm: Normal rate and regular rhythm.     Heart sounds: Normal heart sounds. No murmur  heard.   Pulmonary:     Effort: Pulmonary effort is normal.     Comments: Decreased breath sounds throughout. Rales to RLL. tachypneic and speaking in short sentences. satting in mid 90s on 2L Abdominal:     General: Bowel sounds are normal.     Palpations: Abdomen is soft.     Tenderness: There is abdominal tenderness (epigastric ttp). There is no guarding or rebound.  Musculoskeletal:        General: No tenderness.     Cervical back: Neck supple.     Right lower leg: No edema.     Left lower leg: No edema.  Skin:    General: Skin is warm and dry.  Neurological:     Mental Status: He is alert.     Comments: Alert, clear speech, normal coordination     ED Results / Procedures / Treatments   Labs (all labs ordered are listed, but only abnormal results are displayed) Labs Reviewed  COMPREHENSIVE METABOLIC PANEL - Abnormal; Notable for the following components:      Result Value   Chloride 90 (*)    CO2 36 (*)    Glucose, Bld 189 (*)    Calcium 8.6 (*)    All other components within normal limits  CBC WITH DIFFERENTIAL/PLATELET - Abnormal; Notable for the following components:   WBC 14.1 (*)    Hemoglobin 12.8 (*)    Neutro Abs 12.3 (*)    All other components within normal limits  LACTIC ACID, PLASMA - Abnormal; Notable for the following components:   Lactic Acid, Venous 2.7 (*)    All other components within normal limits  BLOOD GAS, VENOUS - Abnormal; Notable for the following components:   pH, Ven 7.104 (*)    pCO2, Ven >120 (*)    pO2, Ven 95.6 (*)    Bicarbonate 36.5 (*)    Acid-Base Excess 2.8 (*)  All other components within normal limits  BLOOD GAS, ARTERIAL - Abnormal; Notable for the following components:   pH, Arterial 7.570 (*)    pO2, Arterial 148 (*)    Bicarbonate 32.3 (*)    Acid-Base Excess 9.7 (*)    All other components within normal limits  RESP PANEL BY RT-PCR (FLU A&B, COVID) ARPGX2  MRSA PCR SCREENING  CULTURE, BLOOD (SINGLE)  CULTURE,  RESPIRATORY W GRAM STAIN  LIPASE, BLOOD  LACTIC ACID, PLASMA  URINALYSIS, ROUTINE W REFLEX MICROSCOPIC  HEMOGLOBIN A1C  GLUCOSE, CAPILLARY  CBC  BASIC METABOLIC PANEL    EKG EKG Interpretation  Date/Time:  Sunday September 10 2020 10:46:56 EDT Ventricular Rate:  78 PR Interval:  151 QRS Duration: 94 QT Interval:  420 QTC Calculation: 470 R Axis:   -72 Text Interpretation: Sinus rhythm  PVC, baseline wander, no STEMI Confirmed by Octaviano Glow [phone]) on 09/10/2020 1:10:18 PM   Radiology DG Abdomen 1 View  Result Date: 09/10/2020 CLINICAL DATA:  Orogastric tube placement. EXAM: ABDOMEN - 1 VIEW COMPARISON:  Radiograph 12/15/2019. FINDINGS: 1304 hours. Tip of the endotracheal tube overlies the left upper quadrant of the abdomen, consistent with position in the proximal stomach. The visualized bowel gas pattern is nonobstructive. There are postsurgical changes related to ventral hernia repair and thoracolumbar fusion. The spinal rods are chronically fractured at the T11-12 level bilaterally. IMPRESSION: Enteric tube tip in the left upper quadrant the abdomen consistent with position in the proximal stomach. Electronically Signed   By: Richardean Sale M.D.   On: 09/10/2020 13:52   CT Head Wo Contrast  Result Date: 09/10/2020 CLINICAL DATA:  Status post fall. EXAM: CT HEAD WITHOUT CONTRAST TECHNIQUE: Contiguous axial images were obtained from the base of the skull through the vertex without intravenous contrast. COMPARISON:  March 13, 2015 FINDINGS: Brain: There is mild cerebral atrophy with widening of the extra-axial spaces and ventricular dilatation. There are areas of decreased attenuation within the white matter tracts of the supratentorial brain, consistent with microvascular disease changes. Vascular: No hyperdense vessel or unexpected calcification. Skull: Normal. Negative for fracture or focal lesion. Sinuses/Orbits: A 1.3 cm x 1.1 cm right maxillary sinus polyp versus mucous retention  cyst is seen. Other: None. IMPRESSION: 1. Mild cerebral atrophy and microvascular disease changes of the supratentorial brain. 2. No acute intracranial abnormality. 3. Right maxillary sinus polyp versus mucous retention cyst. Electronically Signed   By: Virgina Norfolk M.D.   On: 09/10/2020 15:38   CT Angio Chest PE W and/or Wo Contrast  Result Date: 09/10/2020 CLINICAL DATA:  Worsening hypoxia. Lung cancer. Question pulmonary embolism EXAM: CT ANGIOGRAPHY CHEST WITH CONTRAST TECHNIQUE: Multidetector CT imaging of the chest was performed using the standard protocol during bolus administration of intravenous contrast. Multiplanar CT image reconstructions and MIPs were obtained to evaluate the vascular anatomy. CONTRAST:  170m OMNIPAQUE IOHEXOL 350 MG/ML SOLN COMPARISON:  CT 01/04/2020 FINDINGS: Cardiovascular: The pulmonary arteries are well opacified with contrast to the level of the subsegmental branches. There is no evidence of acute pulmonary embolism. No significant systemic arterial abnormalities are identified, although enhancement of the aortic lumen is limited. The heart size is normal. There is no pericardial effusion. Mediastinum/Nodes: There are no enlarged mediastinal, hilar or axillary lymph nodes.Endotracheal tube terminates in the distal trachea. An enteric tube projects into the mid stomach. The thyroid gland, trachea and esophagus demonstrate no significant findings. Lungs/Pleura: There are small left greater than right pleural effusions with interval enlargement of the right  pleural effusion. No pneumothorax. Previous left upper lobectomy. 5 mm nodule in the superior segment of the left lower lobe is unchanged (image 36/5). No new or enlarging nodules. There is increased compressive atelectasis in the right lower lobe. Upper abdomen:  Abdominal findings are dictated separately Musculoskeletal/Chest wall: Status post thoracolumbar rod fusion. Both spinal rods are chronically fractured at the  T11-12 level. There is a new horizontal fracture plane through the inferior aspect of the T11 vertebral body, best seen on the sagittal images. The posterior extension of this fracture through the posterior elements is not well seen, although this is likely an unstable, Chance-like fracture in this patient with a fused spine. Review of the MIP images confirms the above findings. IMPRESSION: 1. No evidence of acute pulmonary embolism or other acute vascular findings. 2. Interval enlarged right pleural effusion with increased right lower lobe atelectasis. Stable left pleural effusion. 3. Stable small left lower lobe pulmonary nodule. No evidence of local recurrence or metastatic disease. 4. New horizontal fracture through the inferior aspect of the T11 vertebral body suspicious for an unstable, Chance-like fracture in this patient who has been previously fused. The spinal rods are chronically fractured posteriorly at the T11-12 level. 5. Critical Value/emergent results were called by telephone at the time of interpretation on 09/10/2020 at 3:54 pm to Dr Tamera Punt, who verbally acknowledged these results. Electronically Signed   By: Richardean Sale M.D.   On: 09/10/2020 15:54   CT Abdomen Pelvis W Contrast  Result Date: 09/10/2020 CLINICAL DATA:  Caliber breathing. Fall with compression fracture. Stage I lung cancer post lobectomy. EXAM: CT ABDOMEN AND PELVIS WITH CONTRAST TECHNIQUE: Multidetector CT imaging of the abdomen and pelvis was performed using the standard protocol following bolus administration of intravenous contrast. Patient was very agitated and required sedation for scan. CONTRAST:  19m OMNIPAQUE IOHEXOL 350 MG/ML SOLN COMPARISON:  CT chest 01/04/2020 FINDINGS: Lower chest: Bilateral moderate layering pleural effusions. RIGHT basilar atelectasis. Hepatobiliary: No focal hepatic lesion. Postcholecystectomy. No biliary dilatation. Pancreas: Pancreas is normal. No ductal dilatation. No pancreatic  inflammation. Spleen: Normal spleen Adrenals/urinary tract: Adrenal glands normal. No renal obstruction. Bladder distended Stomach/Bowel: NG tube in stomach. Duodenum small-bowel normal. No colon inflammation or obstruction. Vascular/Lymphatic: Abdominal aorta is normal caliber. No periportal or retroperitoneal adenopathy. No pelvic adenopathy. Reproductive: Prostate unremarkable Other: No intraperitoneal free air free fluid. Musculoskeletal: Posterior lumbar fusion IMPRESSION: 1. No acute findings abdomen pelvis. 2. No renal obstruction. 3. No evidence of bowel inflammation. 4. Bilateral pleural effusions. 5. Posterior thoracolumbar fusion. Electronically Signed   By: SSuzy BouchardM.D.   On: 09/10/2020 15:38   DG Chest Portable 1 View  Result Date: 09/10/2020 CLINICAL DATA:  Intubation and orogastric tube placement. EXAM: PORTABLE CHEST 1 VIEW COMPARISON:  Radiographs 09/10/2020 and 07/03/2020.  CT 01/04/2020. FINDINGS: 1303 hours. Interval intubation with tip of the endotracheal tube approximately 2.7 cm above the carina. Enteric tube projects below the diaphragm tip overlying the proximal stomach. The heart size and mediastinal contours are stable. There is slightly improved aeration of the lung bases with persistent bibasilar airspace opacities and probable small pleural effusions. No pneumothorax. IMPRESSION: Satisfactory position of the endotracheal and enteric tubes. Slightly improved aeration of the lungs. Electronically Signed   By: WRichardean SaleM.D.   On: 09/10/2020 13:51   DG Chest Port 1 View  Result Date: 09/10/2020 CLINICAL DATA:  Recent fall with compression fractures. Altered mental status. EXAM: PORTABLE CHEST 1 VIEW COMPARISON:  Radiographs 07/03/2020 and 03/26/2020.  CT 01/04/2020. FINDINGS: 1220 hours. Two views obtained. Persistent low lung volumes. Interval extubation. The heart size and mediastinal contours are stable. There are lower lung volumes with increased patchy airspace  opacities at both lung bases. There may be small bilateral pleural effusions. No evidence of pneumothorax. Patient is status post thoracolumbar fusion. Both spinal rods are chronically fractured at the T11-12 level. IMPRESSION: Increased airspace opacities at both lung bases suspicious for pneumonia, possibly on the basis of aspiration. Chronic fracture of the thoracolumbar spinal rods. Electronically Signed   By: Richardean Sale M.D.   On: 09/10/2020 12:52    Procedures Procedures   CRITICAL CARE Performed by: Rodney Booze   Total critical care time: 45 minutes  Critical care time was exclusive of separately billable procedures and treating other patients.  Critical care was necessary to treat or prevent imminent or life-threatening deterioration.  Critical care was time spent personally by me on the following activities: development of treatment plan with patient and/or surrogate as well as nursing, discussions with consultants, evaluation of patient's response to treatment, examination of patient, obtaining history from patient or surrogate, ordering and performing treatments and interventions, ordering and review of laboratory studies, ordering and review of radiographic studies, pulse oximetry and re-evaluation of patient's condition.   Medications Ordered in ED Medications  naloxone (NARCAN) 0.4 MG/ML injection (  Not Given 09/10/20 1313)  naloxone [facility]) 2 MG/2ML injection (  Not Given 09/10/20 1313)  propofol (DIPRIVAN) 500 MG/50ML infusion (  Not Given 09/10/20 1316)  fentaNYL 259mg in NS 2545m(1063mml) infusion-PREMIX (175 mcg/hr Intravenous Infusion Verify 09/10/20 1738)  docusate sodium (COLACE) capsule 100 mg (has no administration in time range)  polyethylene glycol (MIRALAX / GLYCOLAX) packet 17 g (has no administration in time range)  pantoprazole (PROTONIX) injection 40 mg (has no administration in time range)  0.45 % sodium chloride infusion ( Intravenous Infusion  Verify 09/10/20 1738)  fentaNYL (SUBLIMAZE) injection 25-200 mcg (has no administration in time range)  midazolam (VERSED) 50 mg/50 mL (1 mg/mL) premix infusion (2 mg/hr Intravenous Infusion Verify 09/10/20 1738)  insulin aspart (novoLOG) injection 0-9 Units (3 Units Subcutaneous Given 09/10/20 1705)  sodium chloride flush (NS) 0.9 % injection 10-40 mL (has no administration in time range)  sodium chloride flush (NS) 0.9 % injection 10-40 mL (has no administration in time range)  Chlorhexidine Gluconate Cloth 2 % PADS 6 each (6 each Topical Given 09/10/20 1650)  chlorhexidine gluconate (MEDLINE KIT) (PERIDEX) 0.12 % solution 15 mL (has no administration in time range)  MEDLINE mouth rinse (has no administration in time range)  sodium chloride 0.9 % bolus 1,000 mL (0 mLs Intravenous Stopped 09/10/20 1200)  ondansetron (ZOFRAN) injection 4 mg (4 mg Intravenous Given 09/10/20 1052)  morphine 4 MG/ML injection 4 mg (4 mg Intravenous Given 09/10/20 1053)  LORazepam (ATIVAN) injection 0.5 mg (0.5 mg Intravenous Given 09/10/20 1208)  rocuronium bromide 100 MG/10ML SOSY (  Given 09/10/20 1238)  etomidate (AMIDATE) 2 MG/ML injection (  Given 09/10/20 1237)  fentaNYL (SUBLIMAZE) injection 50 mcg (50 mcg Intravenous Given 09/10/20 1340)  iohexol (OMNIPAQUE) 350 MG/ML injection 100 mL (100 mLs Intravenous Contrast Given 09/10/20 1458)    ED Course  I have reviewed the triage vital signs and the nursing notes.  Pertinent labs & imaging results that were available during my care of the patient were reviewed by me and considered in my medical decision making (see chart for details).  Clinical Course as of 09/10/20 1739  Nancy Fetter Sep 10, 2020  1243 Patient became unresponsive during my evaluation.  Is increasingly hypoxic.  Lab subsequently showed an acidosis with pH 7.1 and hypercapnic respiratory failure with PCO2 over 120.  I suspect this is related to COPD exacerbation worsened by pain.  I had a discussion with the patient's  daughter at the bedside, as well as his wife by the phone, they collectively agreed that we should intubate the patient.  The report the patient is full code at this time.  We proceeded for intubation with rocuronium and etomidate for RSI.  I have asked respiratory to obtain a repeat gas in 30 minutes, a confirmatory chest x-ray, the patient will need CT scans.  I also spoke to Dr. Rosita Fire from critical care who states his team will come evaluate the patient [MT]    Clinical Course User Index [MT] Trifan, Carola Rhine, MD   MDM Rules/Calculators/A&P                          80 year old male presenting for evaluation of shortness of breath, back pain after fall, nausea vomiting and constipation.  On arrival patient noted to be hypoxic at 88% on room air.  He was placed on 2 L and sats improved to 94%.  Reviewed/interpreted labs CBC with leukocytosis of 14,000, hemoglobin shows a mild anemia at 12.8 which appears consistent with prior CMP with elevated CO2 at 36, otherwise reassuring creatinine and LFTs.  No significant electrolyte derangement Lipase negative UA pending on admission COVID negative VBG completed which shows PCO2 greater than 120  EKG Sinus rhythm  PVC, baseline wander, no STEMI  Reviewed/interpreted labs CXR - Increased airspace opacities at both lung bases suspicious for pneumonia, possibly on the basis of aspiration. Chronic fracture of the thoracolumbar spinal rods.  - post intubation cxr reviewed and tube in good position CT chest/abd/pelvis pending on admission  Nursing staff requested additional pain meds for pt as he is c/o pain and still dry heaving. Placed order for small dose ativan and dilaudid. Prior to administration I was called to the bedside as pts sats were dropping due to him pulling of [place]. Nursing initiated face make but pt continually removing this. Pt appears confused compared to initial eval. Small dose ativan given for agitation/nausea. bipap initiated due  to decreased respiratory drive. vbg obtained showed pco2 >120. Decision made to intubate due to severe hypercapnea. Family on board with this decision. Dr. Langston Masker intubated pt and spoke with critical care who will admit the patient.   Final Clinical Impression(s) / ED Diagnoses Final diagnoses:  Acute respiratory failure with hypoxia and hypercapnia [facility])    Rx / DC Orders ED Discharge Orders    None       Rodney Booze, PA-C 09/10/20 1739    Wyvonnia Dusky, MD 09/11/20 [id number]

## 2020-09-10 NOTE — Progress Notes (Signed)
Date and time results received: 09/10/20 1640 (use smartphrase ".now" to insert current time)  Test: Lactic Acid  Critical Value: 2.7  Name of Provider Notified: Dr. Melvyn Novas  Orders Received? Or Actions Taken?: Continue to monitor

## 2020-09-10 NOTE — H&P (Addendum)
NAME:  Devin [name], MRN:  [id number], DOB:  07-05-1940, LOS: 0 ADMISSION DATE:  09/10/2020, CONSULTATION DATE:   REFERRING MD: [facility]ad  CHIEF COMPLAINT:  Cp p fell and broke ribs 3/31  History of Present Illness:  80 yowmb remote smoker f/b Ramaswamy with mostly restrictive pfts p LUL lobectomy  for adenoca Stage 1a and GOLD 0 copd  by pfts 08/31/20 with prior Emphysema on CT so maint  on spiriva  very debilitated due to back pain and a bit unstable on his feet, fell 3/31 while shopping seen by PCP with L rib fx and small effusion rx with hydrocodone and robaxin but pain became intolerable am 4/3 to [facility] with pain/agitation/ sob and req intubation by EDP and PCCM asked to admit.     Pertinent  Medical History  80 y/o male with a h/o afib, adenocarcinoma of the left lung, ankylosing spondylitis, crohn's disease, nephrolithiasis, HTN, DM, obesity, osteoporosis, prostate CA, psoriasis, thyroid CA, recently dx COPD, who presents to the ED today for eval of back pain. Pt had a fall 3/31 after tripping and falling. He denies any head trauma or loc. At that time he was seen by his PCP where he underwent thoracic spine x-ray, lumbar spine x-ray and chest x-ray of the left ribs.  Per paperwork that was brought by daughter at bedside, patient has new compression fractures at T11, T12.  He also has a stable L1 compression fracture, extensive lumbar spondylosis and postsurgical changes.  Additionally had a left seventh rib fracture anteriorly and a left pleural effusion without any obvious pneumothorax.  Since patient fell he has been having increased back pain.  He has also been nauseated and having dry heaves.  He has not vomited.  He denied any abdominal pain but has had some constipation.  Denied urinary symptoms, fevers, chest pain.  He has had some increased shortness of breath from his baseline but denies any increased cough.  He states that he is not on chemotherapy at this time.  States that he has  not been using an incentive spirometer since being diagnosed with his rib fracture.  He was seen by a provider 2 days PTA  who gave him a prescription for hydrocodone and Robaxin.  He also had labs completed at that time which showed a leukocytosis of 18,000 so he was started on Augmentin.  Per daughter at bedside, there was no clear source of infection when patient was started on this antibiotic.  Significant Hospital Events: Including procedures, antibiotic start and stop dates in addition to other pertinent events   . Intubated in ER 4/3 for agitation >>> . Resp viral panel PCR  4/3  Neg covid/ neg flu . Respiratory culture from ET >>>   Interim History / Subjective:  Sedated on vent on my arrival, daughter at bedside reports previously had a hard time waking up from anesthesia and req overnight vent p cystoscopy Jan 25th    Objective   Blood pressure (!) 153/89, pulse 91, temperature 97.8 F (36.6 C), temperature source Oral, resp. rate (!) 22, SpO2 100 %.    Vent Mode: PRVC FiO2 (%):  [50 %] 50 % Set Rate:  [22 bmp] 22 bmp Vt Set:  [550 mL] 550 mL PEEP:  [5 cmH20] 5 cmH20 Plateau Pressure:  [19 cmH20] 19 cmH20  No intake or output data in the 24 hours ending 09/10/20 1328 There were no vitals filed for this visit.  Examination: General: chronically ill appearing elderly wm  sedated on vent HENT: ET Lungs: distant bs bilaterally/ not air trapping  Cardiovascular: RRR no s3 Abdomen: soft benign Extremities: warm s clubbing/edema or obvious trauma Neuro: sedated on vent but moving all 4 ext spontaneously when not sedated  GU: could not pass foley   Labs/imaging that I havepersonally reviewed  (right click and "Reselect all SmartList Selections" daily)  Cmet, cbc , resp viral panel from 4/3   And pfts from 08/29/20  CT chest from 4/3   Resolved Hospital Problem list      Assessment & Plan:  1) acute on chronic (bicarb 36) hypercarbic and acute resp failure (not prev on 02)  combination of chronic narc use and restrictive physiology from prior lung and back surgery. >>> vent overnight on fent/ versed drips then try wean off in am    2) s/p Rib fx/ ? Contusion L lung with ? Hemothorax >>> hold eliquis for now, use PAS >>> no evidence pna clinically > d/c abx from ER  3)  IDDM  >>> SSI   4) T spine fx - s/p prior spinal surgery by Dr Sherley Bounds  See CT 4/3  New horizontal fracture through the inferior aspect of the T11 vertebral body suspicious for an unstable, Chance-like fracture in this patient who has been previously fused. The spinal rods are chronically fractured posteriorly at the T11-12 level. >>> heavy sedation/analgesia for now with TSLO brace per ortho tech/ NS curbside for now as he is in no shape for major T spine intervention and neurologically intact at present.  Best practice (right click and "Reselect all SmartList Selections" daily)  Diet:  NPO Pain/Anxiety/Delirium protocol (if indicated): Yes (RASS goal -1) VAP protocol (if indicated): Yes DVT prophylaxis: Subcutaneous Heparin and SCD GI prophylaxis: PPI Glucose control:  SSI Yes Central venous access:  N/A Arterial line:  N/A Foley:  N/A Mobility:  bed rest  PT consulted: N/A Last date of multidisciplinary goals of care discussion 4/3  [discussed with wife/pt at bedside] Code Status:  full code Disposition: ICU  Labs   CBC: Recent Labs  Lab 09/10/20 1031  WBC 14.1*  NEUTROABS 12.3*  HGB 12.8*  HCT 41.8  MCV 96.8  PLT 709    Basic Metabolic Panel: Recent Labs  Lab 09/10/20 1031  NA 135  K 3.7  CL 90*  CO2 36*  GLUCOSE 189*  BUN 16  CREATININE 1.15  CALCIUM 8.6*   GFR: Estimated Creatinine Clearance: 56.6 mL/min (by C-G formula based on SCr of 1.15 mg/dL). Recent Labs  Lab 09/10/20 1031 09/10/20 1043  WBC 14.1*  --   LATICACIDVEN  --  1.0    Liver Function Tests: Recent Labs  Lab 09/10/20 1031  AST 24  ALT 21  ALKPHOS 66  BILITOT 0.8  PROT  6.7  ALBUMIN 3.6   Recent Labs  Lab 09/10/20 1031  LIPASE 21   No results for input(s): AMMONIA in the last 168 hours.  ABG    Component Value Date/Time   PHART 7.324 (L) 07/03/2020 2100   PCO2ART 69.8 (HH) 07/03/2020 2100   PO2ART 145 (H) 07/03/2020 2100   HCO3 36.5 (H) 09/10/2020 1220   TCO2 23.0 03/17/2015 0540   ACIDBASEDEF 3.6 (H) 03/17/2015 0540   O2SAT 93.0 09/10/2020 1220     Coagulation Profile: No results for input(s): INR, PROTIME in the last 168 hours.  Cardiac Enzymes: No results for input(s): CKTOTAL, CKMB, CKMBINDEX, TROPONINI in the last 168 hours.  HbA1C: Hgb A1c MFr Bld  Date/Time Value Ref Range Status  07/03/2020 02:46 PM 6.2 (H) 4.8 - 5.6 % Final    Comment:    (NOTE) Pre diabetes:          5.7%-6.4%  Diabetes:              >6.4%  Glycemic control for   <7.0% adults with diabetes   01/04/2020 10:43 PM 6.3 (H) 4.8 - 5.6 % Final    Comment:    (NOTE) Pre diabetes:          5.7%-6.4%  Diabetes:              >6.4%  Glycemic control for   <7.0% adults with diabetes     CBG: No results for input(s): GLUCAP in the last 168 hours.  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   Surgical History:   Past Surgical History:  Procedure Laterality Date  . ABDOMINAL EXPLORATION SURGERY     for small bowel obstruction  . APPENDECTOMY  03/2007  . BACK SURGERY     . BASAL CELL CARCINOMA EXCISION  "several"   "head"  . CARDIAC CATHETERIZATION  03/17/2003  . CHOLECYSTECTOMY N/A 05/07/2016   Procedure: LAPAROSCOPIC CHOLECYSTECTOMY;  Surgeon: Fanny Skates, MD;  Location: [facility];  Service: General;  Laterality: N/A;  . COLON SURGERY  03/2007   Resection of cecum, appendix, terminal ileum (approximately/notes 10/10/2010  . CYSTOSCOPY/URETEROSCOPY/HOLMIUM LASER/STENT PLACEMENT Right 07/03/2020   Procedure: CYSTOSCOPY/RETROGRADE/URETEROSCOPY REMOVAL OF BLADDER STONE;  Surgeon: Raynelle Bring, MD;  Location: [facility];  Service: Urology;  Laterality: Right;  . HERNIA REPAIR    . LAPAROSCOPIC CHOLECYSTECTOMY  05/07/2016  . LAPAROSCOPIC LYSIS OF ADHESIONS  05/07/2016  . LAPAROSCOPIC LYSIS OF ADHESIONS N/A 05/07/2016   Procedure: LAPAROSCOPIC LYSIS OF ADHESIONS TIMES ONE HOUR;  Surgeon: Fanny Skates, MD;  Location: [facility];  Service: General;  Laterality: N/A;  . POSTERIOR FUSION THORACIC SPINE  02/08/2016   1. Posterior thoracic arthrodesis T7-T11 utilizing morcellized allograft, 2. Posterior thoracic segmental fixation T7-T11 utilizing nuvasive pedicle screws  . PROSTATECTOMY  06/2001   w/bilateral pelvic lymph nose dissection/notes 10/24/2010  . SPINAL FUSION  12/2014   Open reduction internal fixation of L1 Chance fracture with posterior fusion T10-L4 utilizing morcellized allograft and some local autograft, segmental instrumentation T10-L4 inclusive utilizing nuvasive pedicle screws/notes 12/16/2014  . Stress Cardiolite  02/17/2003  . THOROCOTOMY WITH LOBECTOMY  03/16/2015   Procedure: THOROCOTOMY WITH LOBECTOMY;  Surgeon: Ivin Poot, MD;  Location: [facility];  Service: Thoracic;;  . TONSILLECTOMY    . TOTAL THYROIDECTOMY  1997  . Venous Doppler  05/30/2004  . VENTRAL HERNIA REPAIR  04/14/2008  . VIDEO ASSISTED THORACOSCOPY Left 03/16/2015   Procedure: VIDEO ASSISTED THORACOSCOPY;  Surgeon: Ivin Poot, MD;  Location: [facility];  Service: Thoracic;  Laterality:  Left;     Social History:   reports that he quit smoking about 30 years ago. His smoking use included cigarettes. He has a 60.00 pack-year smoking  history. He has never used smokeless tobacco. He reports previous alcohol use. He reports that he does not use drugs.   Family History:  His family history includes CAD in his mother. There is no history of Cancer.   Allergies No Known Allergies   Home Medications  Prior to Admission medications  - - NOTE:   Unable to verify as accurately reflecting what pt takes     Medication Sig Start Date End Date Taking? Authorizing Provider  albuterol (VENTOLIN HFA) 108 (90 Base) MCG/ACT inhaler Inhale 2 puffs into the lungs every 6 (six) hours as needed for wheezing or shortness of breath. 08/31/20   Brand Males, MD  ALPRAZolam Duanne Moron) 0.5 MG tablet Take 0.5 mg by mouth 2 (two) times daily as needed for anxiety. 02/29/20   [provider]  apixaban (ELIQUIS) 5 MG TABS tablet Take 1 tablet (5 mg total) by mouth 2 (two) times daily. 05/01/20 05/31/20  Fay Records, MD  apixaban (ELIQUIS) 5 MG TABS tablet Take 5 mg by mouth 2 (two) times daily.    [provider]  ASACOL HD 800 MG TBEC Take 1,600 mg by mouth 2 (two) times daily. 11/14/14   [provider]  atenolol (TENORMIN) 25 MG tablet Take 1 tablet (25 mg total) by mouth 2 (two) times daily. 02/09/20   Thurnell Lose, MD  B-D ULTRAFINE III SHORT PEN 31G X 8 MM MISC USE 1 PEN NEEDLE FOUR TIMES DAILY. 09/18/16   Ivar Drape D, PA  busPIRone (BUSPAR) 15 MG tablet Take 15 mg by mouth 2 (two) times daily.  05/17/16   [provider]  calcium-vitamin D (OSCAL WITH D) 500-200 MG-UNIT tablet Take 2 tablets by mouth daily.     [provider]  diltiazem (CARDIZEM) 30 MG tablet Take 1 tablet every 4 hours AS NEEDED for heart rate >100 as long as blood pressure >100. Patient not taking: Reported on 08/31/2020 03/30/20   Fenton, Clint R, PA  Glucose Blood  (ASCENSIA CONTOUR TEST VI) Use as directed to test two times a day    [provider]  glucose blood (BAYER CONTOUR TEST) test strip TEST BLOOD SUGAR TWICE DAILY AS DIRECTED. 05/17/16   Gale Journey, Sarah L, PA-C  glucose blood test strip 1 each by Other route 2 times daily. 05/17/16   [provider]  hydrochlorothiazide (HYDRODIURIL) 12.5 MG tablet Take 12.5 mg by mouth every morning. 06/06/20   [provider]  insulin lispro (HUMALOG KWIKPEN) 100 UNIT/ML KiwkPen INJECT UNDER THE SKIN THREE TIMES DAILY( 25 UNITS, 25 UNITS AND 20 UNITS RESPECTIVELY BEFORE MEALS) Patient taking differently: Inject 20-30 Units into the skin See admin instructions. INJECT UNDER THE SKIN THREE TIMES DAILY( 25 units in the morning, 20 units in the afternoon, 30 units in the evening per patient. 05/17/16   Weber, Damaris Hippo, PA-C  Insulin Pen Needle 31G X 8 MM MISC Inject 31 g into the skin. 09/18/16   [provider]  leuprolide (LUPRON) 11.25 MG injection Inject 11.25 mg into the muscle every 6 (six) months.     [provider]  levothyroxine (SYNTHROID) 125 MCG tablet Take 1 tablet (125 mcg total) by mouth daily before breakfast. 02/10/20   Thurnell Lose, MD  Multiple Vitamins-Iron (DAILY-VITE/IRON/BETA-CAROTENE) TABS Take 1 tablet by mouth daily.     [provider]  Tiotropium Bromide Monohydrate (SPIRIVA RESPIMAT) 2.5 MCG/ACT AERS Inhale 2 puffs into the lungs daily. 08/31/20   Brand Males, MD  zinc gluconate 50 MG tablet Take 50 mg by mouth daily.    [provider]       The patient is critically ill with multiple organ systems failure and requires high complexity decision making for assessment and support, frequent evaluation and titration of therapies, application of advanced monitoring technologies and extensive interpretation of multiple databases. Critical Care Time devoted to patient care services described in this note is 45 minutes.    Christinia Gully, MD Pulmonary and [place] [phone]   After 7:00 pm call Elink  [phone]

## 2020-09-10 NOTE — ED Notes (Addendum)
Pt states "I just dont feel right". Appears pale, spo2 noted to be 70 percent and refusing to keep [place] on.  pa Courtni and MD Trifan called to bedside. Pt anxious, ripping off oxygen, EKG performed, as per PA courtni okay to give ativan.

## 2020-09-10 NOTE — ED Notes (Addendum)
With MD Langston Masker, RT Mordecai Rasmussen, Courtni PA RN Manuela Schwartz at bedside pt intubated by MD Trifan.

## 2020-09-10 NOTE — ED Notes (Signed)
Respiratory at bedside.

## 2020-09-10 NOTE — ED Provider Notes (Signed)
Clinical Course as of 09/10/20 1446  Sun Sep 10, 2020  1243 Patient became unresponsive during my evaluation.  Is increasingly hypoxic.  Lab subsequently showed an acidosis with pH 7.1 and hypercapnic respiratory failure with PCO2 over 120.  I suspect this is related to COPD exacerbation worsened by pain.  I had a discussion with the patient's daughter at the bedside, as well as his wife by the phone, they collectively agreed that we should intubate the patient.  The report the patient is full code at this time.  We proceeded for intubation with rocuronium and etomidate for RSI.  I have asked respiratory to obtain a repeat gas in 30 minutes, a confirmatory chest x-ray, the patient will need CT scans.  I also spoke to Dr. Rosita Fire from critical care who states his team will come evaluate the patient [MT]    Clinical Course User Index [MT] Wyvonnia Dusky, MD   Procedure Name: Intubation Date/Time: 09/10/2020 1:01 PM Performed by: Wyvonnia Dusky, MD Pre-anesthesia Checklist: Patient identified, Patient being monitored, Emergency Drugs available, Timeout performed and Suction available Oxygen Delivery Method: Non-rebreather mask Preoxygenation: Pre-oxygenation with 100% oxygen Induction Type: Rapid sequence Ventilation: Mask ventilation without difficulty and Two handed mask ventilation required Laryngoscope Size: Glidescope and 4 Number of attempts: 1 Airway Equipment and Method: Video-laryngoscopy Placement Confirmation: ETT inserted through vocal cords under direct vision,  CO2 detector and Breath sounds checked- equal and bilateral Secured at: 25 cm Tube secured with: ETT holder    .Critical Care Performed by: Wyvonnia Dusky, MD Authorized by: Wyvonnia Dusky, MD   Critical care provider statement:    Critical care time (minutes):  45   Critical care was necessary to treat or prevent imminent or life-threatening deterioration of the following conditions:  Respiratory failure    Critical care was time spent personally by me on the following activities:  Discussions with consultants, evaluation of patient's response to treatment, examination of patient, ordering and performing treatments and interventions, ordering and review of laboratory studies, ordering and review of radiographic studies, pulse oximetry, re-evaluation of patient's condition, obtaining history from patient or surrogate and review of old charts  RSI with 80 mg IV rocuronium and 30 mg IV etomidate    Wyvonnia Dusky, MD 09/10/20 1446

## 2020-09-10 NOTE — ED Notes (Signed)
Failed foley attempt x2 notifed MD Wert who states condom cath is appropriate at this time -

## 2020-09-10 NOTE — ED Triage Notes (Signed)
Pt fell Thursday, has 3 compression fractures to spine, states PO meds at home aren't enough for pain control.

## 2020-09-10 NOTE — Progress Notes (Signed)
A consult was received from an ED physician for vancomycin and cefepime per pharmacy dosing.  The patient's profile has been reviewed for ht/wt/allergies/indication/available labs.   A one time order has been placed for vancomycin 1g and cefepime 2.  Further antibiotics/pharmacy consults should be ordered by admitting physician if indicated.                       Thank you, Peggyann Juba, PharmD, BCPS 09/10/2020  12:54 PM

## 2020-09-10 NOTE — ED Notes (Signed)
Pt nauseated, having pain, refusing to keep on nasal canula, notified PA Courtni.

## 2020-09-10 NOTE — ED Notes (Signed)
Pt spo2 noted to be low. Pt states he has hx of COPD. Denies cough, SOB. States he may be taking shallow breaths r/t pain.

## 2020-09-10 NOTE — Progress Notes (Addendum)
   Providing Compassionate, Quality Care - Together   Patient with a history of extensive thoracolumbar fusion by Dr. Ronnald Ramp, suffered a fall while at the [place] [facility]. Patient was medicated with hydrocodone and methocarbamol and developed SOB. Patient's CT chest revealed a new fracture through the inferior aspect of the T11 vertebral body. Patient should don TLSO brace at all times. Patient is intubated and sedated, but moving all extremities per report. Dr. Ronnald Ramp will evaluate this week and make further recommendations regarding whether surgical intervention is necessary.  Viona Gilmore, DNP, AGNP-C Nurse Practitioner 09/10/2020 5:25 PM  [facility] [place] [address], [address], [place], [place] [address] P: [phone]    F: [phone]

## 2020-09-10 NOTE — ED Notes (Signed)
Pt became obtunded with PA Courtni and respiratory at bedside. Pt breathing via BVM

## 2020-09-10 NOTE — ED Notes (Signed)
Failed attempt at temp foley by NT [facility]. Charge rn susan at bedside attempting at this time.

## 2020-09-11 ENCOUNTER — Inpatient Hospital Stay (HOSPITAL_COMMUNITY): Payer: Medicare Other

## 2020-09-11 ENCOUNTER — Encounter (HOSPITAL_COMMUNITY): Payer: Self-pay | Admitting: Internal Medicine

## 2020-09-11 DIAGNOSIS — S22080G Wedge compression fracture of T11-T12 vertebra, subsequent encounter for fracture with delayed healing: Secondary | ICD-10-CM | POA: Diagnosis not present

## 2020-09-11 DIAGNOSIS — J9622 Acute and chronic respiratory failure with hypercapnia: Secondary | ICD-10-CM

## 2020-09-11 DIAGNOSIS — L8990 Pressure ulcer of unspecified site, unspecified stage: Secondary | ICD-10-CM | POA: Insufficient documentation

## 2020-09-11 DIAGNOSIS — J9621 Acute and chronic respiratory failure with hypoxia: Secondary | ICD-10-CM

## 2020-09-11 DIAGNOSIS — S2232XA Fracture of one rib, left side, initial encounter for closed fracture: Secondary | ICD-10-CM | POA: Diagnosis not present

## 2020-09-11 LAB — GLUCOSE, CAPILLARY
Glucose-Capillary: 114 mg/dL — ABNORMAL HIGH (ref 70–99)
Glucose-Capillary: 116 mg/dL — ABNORMAL HIGH (ref 70–99)
Glucose-Capillary: 120 mg/dL — ABNORMAL HIGH (ref 70–99)
Glucose-Capillary: 137 mg/dL — ABNORMAL HIGH (ref 70–99)
Glucose-Capillary: 157 mg/dL — ABNORMAL HIGH (ref 70–99)
Glucose-Capillary: 87 mg/dL (ref 70–99)
Glucose-Capillary: 99 mg/dL (ref 70–99)
Glucose-Capillary: 99 mg/dL (ref 70–99)

## 2020-09-11 LAB — BASIC METABOLIC PANEL
Anion gap: 11 (ref 5–15)
BUN: 18 mg/dL (ref 8–23)
CO2: 30 mmol/L (ref 22–32)
Calcium: 8.3 mg/dL — ABNORMAL LOW (ref 8.9–10.3)
Chloride: 93 mmol/L — ABNORMAL LOW (ref 98–111)
Creatinine, Ser: 1.23 mg/dL (ref 0.61–1.24)
GFR, Estimated: >60 mL/min (ref >60)
Glucose, Bld: 89 mg/dL (ref 70–99)
Potassium: 3.5 mmol/L (ref 3.5–5.1)
Sodium: 134 mmol/L — ABNORMAL LOW (ref 135–145)

## 2020-09-11 LAB — MAGNESIUM
Magnesium: 1.4 mg/dL — ABNORMAL LOW (ref 1.7–2.4)
Magnesium: 1.9 mg/dL (ref 1.7–2.4)

## 2020-09-11 LAB — CBC
HCT: 39.9 % (ref 39.0–52.0)
Hemoglobin: 12.7 g/dL — ABNORMAL LOW (ref 13.0–17.0)
MCH: 30.5 pg (ref 26.0–34.0)
MCHC: 31.8 g/dL (ref 30.0–36.0)
MCV: 95.9 fL (ref 80.0–100.0)
Platelets: 220 10*3/uL (ref 150–400)
RBC: 4.16 MIL/uL — ABNORMAL LOW (ref 4.22–5.81)
RDW: 13.8 % (ref 11.5–15.5)
WBC: 13 10*3/uL — ABNORMAL HIGH (ref 4.0–10.5)
nRBC: 0 % (ref 0.0–0.2)

## 2020-09-11 LAB — PHOSPHORUS
Phosphorus: <1 mg/dL — CL (ref 2.5–4.6)
Phosphorus: 1.1 mg/dL — ABNORMAL LOW (ref 2.5–4.6)

## 2020-09-11 MED ORDER — VITAL AF 1.2 CAL PO LIQD: 1000.0000 mL | ORAL | Status: DC

## 2020-09-11 MED ORDER — BUSPIRONE HCL 5 MG PO TABS
15.0000 mg | ORAL_TABLET | Freq: Two times a day (BID) | ORAL | Status: DC
  Administered 2020-09-11 – 2020-10-06 (×48): 15 mg
  Filled 2020-09-11: qty 3
  Filled 2020-09-11: qty 1
  Filled 2020-09-11 (×2): qty 3
  Filled 2020-09-11 (×2): qty 1
  Filled 2020-09-11: qty 3
  Filled 2020-09-11: qty 1
  Filled 2020-09-11: qty 3
  Filled 2020-09-11 (×2): qty 1
  Filled 2020-09-11 (×2): qty 3
  Filled 2020-09-11: qty 1
  Filled 2020-09-11 (×3): qty 3
  Filled 2020-09-11 (×3): qty 1
  Filled 2020-09-11: qty 3
  Filled 2020-09-11: qty 1
  Filled 2020-09-11: qty 3
  Filled 2020-09-11 (×3): qty 1
  Filled 2020-09-11 (×2): qty 3
  Filled 2020-09-11: qty 1
  Filled 2020-09-11: qty 3
  Filled 2020-09-11 (×3): qty 1
  Filled 2020-09-11: qty 3
  Filled 2020-09-11 (×4): qty 1
  Filled 2020-09-11 (×2): qty 3
  Filled 2020-09-11 (×3): qty 1
  Filled 2020-09-11: qty 3
  Filled 2020-09-11: qty 1
  Filled 2020-09-11 (×2): qty 3
  Filled 2020-09-11 (×2): qty 1

## 2020-09-11 MED ORDER — VITAL HIGH PROTEIN PO LIQD
1000.0000 mL | ORAL | Status: AC
  Administered 2020-09-11: 1000 mL

## 2020-09-11 MED ORDER — LEVOTHYROXINE SODIUM 25 MCG PO TABS
125.0000 ug | ORAL_TABLET | Freq: Every day | ORAL | Status: DC
  Administered 2020-09-12 – 2020-10-06 (×23): 125 ug
  Filled 2020-09-11 (×24): qty 1

## 2020-09-11 MED ORDER — ATENOLOL 25 MG PO TABS
12.5000 mg | ORAL_TABLET | Freq: Two times a day (BID) | ORAL | Status: DC
  Administered 2020-09-11 – 2020-09-12 (×3): 12.5 mg
  Filled 2020-09-11: qty 1
  Filled 2020-09-11 (×2): qty 0.5
  Filled 2020-09-11 (×2): qty 1

## 2020-09-11 MED ORDER — VITAL AF 1.2 CAL PO LIQD
1000.0000 mL | ORAL | Status: DC
  Administered 2020-09-11 – 2020-09-17 (×6): 1000 mL

## 2020-09-11 MED ORDER — BUDESONIDE 0.5 MG/2ML IN SUSP
0.5000 mg | Freq: Two times a day (BID) | RESPIRATORY_TRACT | Status: DC
  Administered 2020-09-11 – 2020-10-06 (×49): 0.5 mg via RESPIRATORY_TRACT
  Filled 2020-09-11 (×50): qty 2

## 2020-09-11 MED ORDER — MAGNESIUM SULFATE 2 GM/50ML IV SOLN
2.0000 g | Freq: Once | INTRAVENOUS | Status: AC
  Administered 2020-09-11: 2 g via INTRAVENOUS
  Filled 2020-09-11: qty 50

## 2020-09-11 MED ORDER — FREE WATER
100.0000 mL | Freq: Four times a day (QID) | Status: DC
  Administered 2020-09-11 – 2020-09-13 (×7): 100 mL

## 2020-09-11 MED ORDER — VITAL HIGH PROTEIN PO LIQD
1000.0000 mL | ORAL | Status: DC
  Administered 2020-09-11: 1000 mL

## 2020-09-11 MED ORDER — PANTOPRAZOLE SODIUM 40 MG PO PACK
40.0000 mg | PACK | Freq: Every day | ORAL | Status: DC
  Administered 2020-09-11 – 2020-09-25 (×15): 40 mg
  Filled 2020-09-11 (×15): qty 20

## 2020-09-11 MED ORDER — POTASSIUM PHOSPHATES 15 MMOLE/5ML IV SOLN
20.0000 mmol | Freq: Once | INTRAVENOUS | Status: AC
  Administered 2020-09-11: 20 mmol via INTRAVENOUS
  Filled 2020-09-11: qty 6.67

## 2020-09-11 MED ORDER — HEPARIN SODIUM (PORCINE) 5000 UNIT/ML IJ SOLN
5000.0000 [IU] | Freq: Three times a day (TID) | INTRAMUSCULAR | Status: DC
  Administered 2020-09-11 – 2020-09-14 (×11): 5000 [IU] via SUBCUTANEOUS
  Filled 2020-09-11 (×11): qty 1

## 2020-09-11 MED ORDER — VITAL HIGH PROTEIN PO LIQD: 1000.0000 mL | ORAL | Status: DC

## 2020-09-11 MED ORDER — PROSOURCE TF PO LIQD
45.0000 mL | Freq: Two times a day (BID) | ORAL | Status: DC
  Administered 2020-09-11: 45 mL
  Filled 2020-09-11: qty 45

## 2020-09-11 NOTE — Progress Notes (Signed)
eLink Physician-Brief Progress Note Patient Name: Devin [name] DOB: 05-15-1941 MRN: [id number]   Date of Service  09/11/2020  HPI/Events of Note  Patient is on the ventilator and requires bilateral wrist restraints to prevent self-extubation.  eICU Interventions  Bilateral wrist restraints ordered.        Kerry Kass Jahni Paul 09/11/2020, 10:45 PM

## 2020-09-11 NOTE — Progress Notes (Signed)
Initial Nutrition Assessment  DOCUMENTATION CODES:   Not applicable  INTERVENTION:  - will order TF regimen: Vital AF 1.2 @ 15 ml/hr to advance by 10 ml every 8 hours to reach goal rate of 65 ml/hr with 100 ml free water QID.  - at goal rate, this regimen will provide 1872 kcal, 117 grams protein, and 1465 ml free water.    Monitor magnesium, potassium, and phosphorus daily for at least 3 days, MD to replete as needed, as pt is at possible risk for refeeding syndrome given hypomagnesemia and hypophosphatemia.   NUTRITION DIAGNOSIS:   Inadequate oral intake related to inability to eat as evidenced by NPO status.  GOAL:   Patient will meet greater than or equal to 90% of their needs  MONITOR:   Vent status,TF tolerance,Labs,Weight trends  REASON FOR ASSESSMENT:   Ventilator,Consult Enteral/tube feeding initiation and management  ASSESSMENT:   80 y/o male with medical history of afib, adenocarcinoma of the L lung, ankylosing spondylitis, Crohn's disease, nephrolithiasis, HTN, DM, obesity, osteoporosis, prostate cancer, psoriasis, thyroid cancer, and recently dx COPD. He presented to the ED due to back pain. He experienced a fall on 3/31; seen by his PCP for thoracic spine xray, lumbar spine xray, and CXR of the L ribs. He has had increased back pain since the fall and having nausea, dry heaving, and no vomiting episodes. He is not on chemo at this time.  Patient was discussed in rounds this AM. He is intubated with OGT in place (gastric). No family or visitors present at the time of RD visit.   Weight today and yesterday of 184 lb; weight has been fairly stable with slight fluctuations since 02/21/20.   Per notes: - agitated delirium 2/2 pain - acute on chronic respiratory failure with hx of stage 1 L lung adenocarcinoma - rib fractures, possible L lung contusion with possible hemothorax  - thoracic spine fractures - at risk for malnutrition   Patient is currently intubated  on ventilator support MV: 10 L/min Temp (24hrs), Avg:98.5 F (36.9 C), Min:97.2 F (36.2 C), Max:99.6 F (37.6 C) Propofol: none   Labs reviewed; CBGs: 87, 120, 99, 116, 99 mg/dl, Na: 134 mmol/l, Cl: 93 mmol/l, Ca: 8.3 mg/dl, Phos: <1 mg/dl, Mg: 1.4 mg/dl. Medications reviewed; sliding scale novolog, 125 mcg synthroid per OGT/day, 40 mg protonix per OGT/day.  IVF; 1/2 NS @ 100 ml/hr. Drips; fentanyl @ 200 mcg/hr, versed @ 3 mg/hr.      NUTRITION - FOCUSED PHYSICAL EXAM:  Flowsheet Row Most Recent Value  Orbital Region No depletion  Upper Arm Region No depletion  Thoracic and Lumbar Region No depletion  Buccal Region Mild depletion  Temple Region No depletion  Clavicle Bone Region Moderate depletion  Clavicle and Acromion Bone Region Mild depletion  Scapular Bone Region Unable to assess  Dorsal Hand Unable to assess  [bilateral mittens]  Patellar Region No depletion  Anterior Thigh Region No depletion  Posterior Calf Region No depletion  Edema (RD Assessment) None  Hair Reviewed  Eyes Unable to assess  Mouth Unable to assess  Skin Reviewed  Nails Unable to assess       Diet Order:   Diet Order            Diet NPO time specified  Diet effective now                 EDUCATION NEEDS:   No education needs have been identified at this time  Skin:  Skin  Assessment: Skin Integrity Issues: Skin Integrity Issues:: Stage II Stage II: sacrum  Last BM:  PTA/unknown  Height:   Ht Readings from Last 1 Encounters:  09/10/20 5' 9.02" (1.753 m)    Weight:   Wt Readings from Last 1 Encounters:  09/11/20 83.6 kg     Estimated Nutritional Needs:  Kcal:  1872 kcal Protein:  100-125 grams Fluid:  >/= 2 L/day     Jarome Matin, MS, RD, LDN, CNSC Inpatient Clinical Dietitian RD pager # available in [place]  After hours/weekend pager # available in [facility]

## 2020-09-11 NOTE — Progress Notes (Signed)
Orthopedic Tech Progress Note Patient Details:  Devin [name] 06-Nov-1940 [id number]  Patient ID: Devin [name], male   DOB: 12-29-1940, 80 y.o.   MRN: [id number]   Devin [name] 09/11/2020, 9:53 AMCalled and Routed TLSO order to [employer]

## 2020-09-11 NOTE — Progress Notes (Signed)
I have reviewed the imaging.  There is a new T11 Chance fracture between the old fusion surgeries.  The rods are not chronically fractured.  He first had a lumbar fracture and underwent a T10-L4 fusion.  He then fractured T9 above this, and to repair this we did a T7-T11 fusion requiring cutting of the rods at T11-12 placed new rods T7-T11.  So the rods are not fractured.  He simply fractured between the old fusion construct.  He is likely going to need surgery to now fuse across T11-12 and placed rods across T11-12.  He would need to be transferred to [facility] so that he can have that surgery when stable from a cardiopulmonary standpoint.  We would tentatively look at next Monday and give him a chance to stabilize.

## 2020-09-11 NOTE — Progress Notes (Signed)
NAME:  Devin [name], MRN:  [id number], DOB:  02/03/1941, LOS: 1 ADMISSION DATE:  09/10/2020, CONSULTATION DATE:   REFERRING MD: Triad  CHIEF COMPLAINT:  Chest pain, rib fractures 3/31  History of Present Illness:  80 y/o M, remote smoker, followed by Dr. Chase Caller with mostly restrictive PFTs p LUL lobectomy for adenoca Stage 1a and GOLD 0 COPD by PFT 08/31/20 with prior emphysema on CT so maintained on Spiriva.  At baseline very debilitated due to back pain and a bit unstable on his feet, fell 3/31 while shopping seen by PCP with L rib fx and small effusion Rx with hydrocodone and robaxin but pain became intolerable am 4/3 to [facility] with pain, agitation & SOB. Per paperwork that was brought by daughter, patient has new compression fractures at T11, T12.  He also has a stable L1 compression fracture, extensive lumbar spondylosis and postsurgical changes.  Additionally had a left seventh rib fracture anteriorly and a left pleural effusion without any obvious pneumothorax.  In the ER, the patient was intubated for agitated delirium, pain control.   Significant Hospital Events: Including procedures, antibiotic start and stop dates in addition to other pertinent events   . 4/3 admit with pain, SOB, agitated delirium. Intubated in ER 4/3 for agitation. RVP / COVID / Flu negative. Note daughter reported difficult time waking from anesthesia 07/04/20 after cystoscopy.  Tracheal aspirate sent. . 4/4 Remains on vent, Dr. Ronnald Ramp requesting patient be transferred to [facility] for surgical intervention  Interim History / Subjective:  RN reports no acute events, pt on vent  Afebrile / tmax 99.6  Glucose 99-120 I/O 1.3L UOP, +447m in last 24 hours   Objective   Blood pressure (!) 112/53, pulse 76, temperature 98.4 F (36.9 C), temperature source Oral, resp. rate 17, height 5' 9.02" (1.753 m), weight 83.6 kg, SpO2 99 %.    Vent Mode: PRVC FiO2 (%):  [30 %-50 %] 30 % Set Rate:  [18 bmp-22 bmp] 18 bmp Vt Set:   [550 mL-560 mL] 560 mL PEEP:  [5 cmH20] 5 cmH20 Plateau Pressure:  [19 cmH20-22 cmH20] 19 cmH20   Intake/Output Summary (Last 24 hours) at 09/11/2020 1152 Last data filed at 09/11/2020 0500 Gross per 24 hour  Intake 1835.69 ml  Output 1350 ml  Net 485.69 ml   Filed Weights   09/10/20 2136 09/11/20 0410 09/11/20 0527  Weight: 83.4 kg 83.3 kg 83.6 kg    Examination: General: chronically ill appearing elderly male lying in bed in NAD on vent   HEENT: MM pink/moist, ETT, pupils 322mreactive, anicteric  Neuro: sedate, moves extremities spontaneously  CV: s1s2 RRR, no m/r/g PULM: non-labored on vent, lungs bilaterally distant but clear  GI: soft, bsx4 active  Extremities: warm/dry, no edema  Skin: no rashes or lesions  PCXR 4/4 >> images personally reviewed, low lung volumes, ETT in good position, chronically elevated right hemidiaphragm, small layering effusion, no acute infiltrate, noted spinal hardware        Resolved Hospital Problem list      Assessment & Plan:   Agitated Delirium in setting of Pain Baseline Xanax Dependent  -use fentanyl for pain / sedation  -versed if needed for anxiety, attempt to reduce rate / wean to PRN dosing -if versed stopped, resume home xanax  -delirium prevention measures   Acute on Chronic (bicarb 36) Hypercarbic Resp Failure  Hx Stage I Adenocarcinoma of Left Lung Not prev on 02, combination of chronic narc use and restrictive physiology from  prior lung and back surgery -PRVC 8cc/kg as rest mode ventilation  -reduce rate to 16 -wean PEEP / fiO2 for sats >90% -daily SBT / WUA  -pending NSGY for surgical intervention, noted difficulty waking from anesthesia in 06/2020 -given difficulty with anesthesia, may need to remain intubated until surgery to allow for adequate pain control   Rib Fractures ? Contusion L lung with ? Hemothorax Small Layering Effusion  -hold eliquis for now  -SCD's, SQ heparin for DVT prophylaxis  -no evidence of PNA  on CXR, monitor off abx   DM II / IDDM  -SSI, sensitive scale for now  Thoracic Spine Fx  S/p prior spinal surgery by Dr Sherley Bounds, see CT 4/3. New horizontal fracture through the inferior aspect of the T11 vertebral body suspicious for an unstable, chance-like fracture in this patient who has been previously fused. The spinal rods are chronically fractured posteriorly at the T11-12 level. -appreciate NSGY evaluation, requesting transfer to [facility]  -continue TSLO brace for now  -possible NSGY intervention on 4/11   At Risk Malnutrition  -begin TF per protocol   Hypothyroidism  -resume synthroid   AF on Eliquis  -hold home eliquis  -tele monitoring  -resume home atenolol at reduced dose   Best practice (right click and "Reselect all SmartList Selections" daily)  Diet:  Tube Feed  Pain/Anxiety/Delirium protocol (if indicated): Yes (RASS goal -1) VAP protocol (if indicated): Yes DVT prophylaxis: Subcutaneous Heparin and SCD GI prophylaxis: PPI Glucose control:  SSI Yes Central venous access:  N/A Arterial line:  N/A Foley:  N/A Mobility:  bed rest  PT consulted: N/A Last date of multidisciplinary goals of care discussion: 4/3  discussed with wife/pt at bedside Code Status:  full code Disposition: ICU   Family updated 4/4 at bedside.    Critical Care Time: 17 minutes    Noe Gens, MSN, APRN, NP-C, AGACNP-BC [facility] 09/11/2020, 12:19 PM   Please see [email or web address] for pager details.   From 7A-7P if no response, please call [phone] After hours, please call ELink [phone]

## 2020-09-11 NOTE — TOC Initial Note (Signed)
Transition of Care [facility]) - Initial/Assessment Note    Patient Details  Name: Devin [name] MRN: [id number] Date of Birth: 1941-05-20  Transition of Care [facility]) CM/SW Contact:    Leeroy Cha, RN Phone Number: 09/11/2020, 7:54 AM  Clinical Narrative:                 77 yowmb remote smoker f/b Ramaswamy with mostly restrictive pfts p LUL lobectomy  for adenoca Stage 1a and GOLD 0 copd  by pfts 08/31/20 with prior Emphysema on CT so maint  on spiriva  very debilitated due to back pain and a bit unstable on his feet, fell 3/31 while shopping seen by PCP with L rib fx and small effusion rx with hydrocodone and robaxin but pain became intolerable am 4/3 to [facility] with pain/agitation/ sob and req intubation by EDP and PCCM asked to admit.     Pertinent  Medical History  80 y/o male with a h/o afib, adenocarcinoma of the left lung, ankylosing spondylitis, crohn's disease, nephrolithiasis, HTN, DM, obesity, osteoporosis, prostate CA, psoriasis, thyroid CA,recently dx COPD,who presents to the ED today for eval of back pain.Pt had a fall 3/31 after tripping and falling.He denies any head trauma or loc.At that time he was seen by his PCP where he underwent thoracic spine x-ray, lumbar spine x-ray and chest x-ray of the left ribs. Per paperwork that was brought by daughter at bedside, patient has new compression fractures at T11, T12. He also has a stable L1 compression fracture, extensive lumbar spondylosis and postsurgical changes. Additionally had a left seventh rib fracture anteriorly and a left pleural effusion without any obvious pneumothorax.  Since patient fell he has been having increased back pain. He has also been nauseated and having dry heaves. He has not vomited. He denied any abdominal pain but has had some constipation. Denied urinary symptoms, fevers, chest pain. He has had some increased shortness of breath from his baseline but denies any increased cough. He states that  he is not on chemotherapy at this time.  States that he has not been using an incentive spirometer since being diagnosed with his rib fracture. He was seen by a provider 2 days PTA  who gave him a prescription for hydrocodone and Robaxin. He also had labs completed at that time which showed a leukocytosis of 18,000 so he was started on Augmentin. Per daughter at bedside, there was no clear source of infection when patient was started on this antibiotic.  Significant Hospital Events: Including procedures, antibiotic start and stop dates in addition to other pertinent events    Intubated in ER 4/3 for agitation >>>  Resp viral panel PCR  4/3  Neg covid/ neg flu  Respiratory culture from ET >>>   PLANS: to return to homne, following to dc needs,toc needs.  Expected Discharge Plan: Home/Self Care Barriers to Discharge: Continued Medical Work up   Patient Goals and CMS Choice Patient states their goals for this hospitalization and ongoing recovery are:: to go home yo my wife CMS Medicare.gov Compare Post Acute Care list provided to:: Patient Choice offered to / list presented to : Patient  Expected Discharge Plan and Services Expected Discharge Plan: Home/Self Care   Discharge Planning Services: CM Consult   Living arrangements for the past 2 months: [place]  Prior Living Arrangements/Services Living arrangements for the past 2 months: Single Family Home Lives with:: Spouse Patient language and need for interpreter reviewed:: Yes Do you feel safe going back to the place where you live?: Yes      Need for Family Participation in Patient Care: Yes (Comment) Care giver support system in place?: Yes (comment)   Criminal Activity/Legal Involvement Pertinent to Current Situation/Hospitalization: No - Comment as needed  Activities of Daily Living      Permission Sought/Granted                  Emotional  Assessment Appearance:: Appears stated age Attitude/Demeanor/Rapport: Engaged Affect (typically observed): Calm Orientation: : Oriented to Self,Oriented to Place,Oriented to  Time,Oriented to Situation Alcohol / Substance Use: Not Applicable Psych Involvement: No (comment)  Admission diagnosis:  SOB (shortness of breath) [R06.02] Acute respiratory failure with hypoxia and hypercapnia (HCC) [J96.01, J96.02] Acute on chronic respiratory failure with hypoxia and hypercapnia (HCC) [C62.37, J96.22] Patient Active Problem List   Diagnosis Date Noted  . Left rib fracture 09/10/2020  . Hemothorax on left 09/10/2020  . Acute on chronic respiratory failure with hypoxia and hypercapnia ([place]) 09/10/2020  . S/P cystoscopy 07/03/2020  . Respiratory failure ([place]) 07/03/2020  . Acute and chronic respiratory failure, unspecified whether with hypoxia or hypercapnia ([place]) 07/03/2020  . Paroxysmal atrial fibrillation ([place]) 03/30/2020  . Secondary hypercoagulable state ([place]) 03/30/2020  . Orthostatic hypotension 02/07/2020  . Dyspnea 01/17/2020  . NSTEMI (non-ST elevated myocardial infarction) ([place]) 01/04/2020  . Crohn's ileocolitis ([place]) 02/21/2017  . Hyperlipidemia 02/21/2017  . Hyperuricemia 02/21/2017  . 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 Thyroid cancer ([place]) 02/21/2017  . Cholecystitis with cholelithiasis 05/07/2016  . Cholecystitis 03/14/2016  . Abdominal pain 03/05/2016  . Malnutrition of moderate degree 02/10/2016  . Thoracic compression fracture ([place]) 02/08/2016  . Back pain 01/26/2016  . Hip pain 01/26/2016  . S/P lobectomy of lung 03/16/2015  . Adenocarcinoma of left lung, stage 1 ([place]) 03/10/2015  . Diabetic neuropathy ([place])   . Ankylosing spondylitis ([place])   . Obesity (BMI 30-39.9)   . Crohn's disease ([place])   . Lung nodule 02/23/2015  . HIstory of basal cell cancer of face   . History of thyroid cancer   . Postsurgical hypothyroidism   . Gout 12/29/2006  . Essential  hypertension 12/29/2006  . Osteoporosis 12/29/2006  . DM (diabetes mellitus), type 2 with renal complications ([place])   . Prostate cancer with recurrence    PCP:  Prince Solian, MD Pharmacy:   [facility] [place], [place] AT [address] [place] [address] Phone: [phone] Fax: [phone]     Social Determinants of Health (SDOH) Interventions    Readmission Risk Interventions No flowsheet data found.

## 2020-09-11 NOTE — Progress Notes (Signed)

## 2020-09-12 ENCOUNTER — Encounter (HOSPITAL_COMMUNITY): Payer: Self-pay | Admitting: Internal Medicine

## 2020-09-12 DIAGNOSIS — J9622 Acute and chronic respiratory failure with hypercapnia: Secondary | ICD-10-CM | POA: Diagnosis not present

## 2020-09-12 DIAGNOSIS — S22000D Wedge compression fracture of unspecified thoracic vertebra, subsequent encounter for fracture with routine healing: Secondary | ICD-10-CM

## 2020-09-12 DIAGNOSIS — J942 Hemothorax: Secondary | ICD-10-CM | POA: Diagnosis not present

## 2020-09-12 DIAGNOSIS — J9621 Acute and chronic respiratory failure with hypoxia: Secondary | ICD-10-CM | POA: Diagnosis not present

## 2020-09-12 LAB — BASIC METABOLIC PANEL
Anion gap: 10 (ref 5–15)
BUN: 28 mg/dL — ABNORMAL HIGH (ref 8–23)
CO2: 27 mmol/L (ref 22–32)
Calcium: 7.3 mg/dL — ABNORMAL LOW (ref 8.9–10.3)
Chloride: 94 mmol/L — ABNORMAL LOW (ref 98–111)
Creatinine, Ser: 1.66 mg/dL — ABNORMAL HIGH (ref 0.61–1.24)
GFR, Estimated: 42 mL/min — ABNORMAL LOW (ref >60)
Glucose, Bld: 119 mg/dL — ABNORMAL HIGH (ref 70–99)
Potassium: 3 mmol/L — ABNORMAL LOW (ref 3.5–5.1)
Sodium: 131 mmol/L — ABNORMAL LOW (ref 135–145)

## 2020-09-12 LAB — CBC
HCT: 34.2 % — ABNORMAL LOW (ref 39.0–52.0)
Hemoglobin: 11.1 g/dL — ABNORMAL LOW (ref 13.0–17.0)
MCH: 30.7 pg (ref 26.0–34.0)
MCHC: 32.5 g/dL (ref 30.0–36.0)
MCV: 94.5 fL (ref 80.0–100.0)
Platelets: 225 10*3/uL (ref 150–400)
RBC: 3.62 MIL/uL — ABNORMAL LOW (ref 4.22–5.81)
RDW: 14.5 % (ref 11.5–15.5)
WBC: 13.8 10*3/uL — ABNORMAL HIGH (ref 4.0–10.5)
nRBC: 0 % (ref 0.0–0.2)

## 2020-09-12 LAB — GLUCOSE, CAPILLARY
Glucose-Capillary: 127 mg/dL — ABNORMAL HIGH (ref 70–99)
Glucose-Capillary: 159 mg/dL — ABNORMAL HIGH (ref 70–99)
Glucose-Capillary: 162 mg/dL — ABNORMAL HIGH (ref 70–99)
Glucose-Capillary: 164 mg/dL — ABNORMAL HIGH (ref 70–99)
Glucose-Capillary: 133 mg/dL — ABNORMAL HIGH (ref 70–99)
Glucose-Capillary: 136 mg/dL — ABNORMAL HIGH (ref 70–99)
Glucose-Capillary: 165 mg/dL — ABNORMAL HIGH (ref 70–99)

## 2020-09-12 LAB — PHOSPHORUS
Phosphorus: 2.3 mg/dL — ABNORMAL LOW (ref 2.5–4.6)
Phosphorus: 2.8 mg/dL (ref 2.5–4.6)

## 2020-09-12 LAB — MAGNESIUM
Magnesium: 1.7 mg/dL (ref 1.7–2.4)
Magnesium: 1.9 mg/dL (ref 1.7–2.4)

## 2020-09-12 MED ORDER — MAGNESIUM SULFATE 2 GM/50ML IV SOLN
2.0000 g | Freq: Once | INTRAVENOUS | Status: AC
Start: 2020-09-12 — End: 2020-09-12
  Administered 2020-09-12: 2 g via INTRAVENOUS
  Filled 2020-09-12: qty 50

## 2020-09-12 MED ORDER — MIDAZOLAM 50MG/50ML (1MG/ML) PREMIX INFUSION
0.5000 mg/h | INTRAVENOUS | Status: DC
  Administered 2020-09-12: 0.5 mg/h via INTRAVENOUS
  Filled 2020-09-12: qty 50

## 2020-09-12 MED ORDER — DEXTROSE 5 % IV SOLN
15.0000 mmol | Freq: Once | INTRAVENOUS | Status: AC
  Administered 2020-09-12: 15 mmol via INTRAVENOUS
  Filled 2020-09-12: qty 5

## 2020-09-12 MED ORDER — ACETAMINOPHEN 160 MG/5ML PO SOLN
500.0000 mg | Freq: Four times a day (QID) | ORAL | Status: DC
  Administered 2020-09-12 – 2020-10-06 (×91): 500 mg
  Filled 2020-09-12 (×91): qty 20.3

## 2020-09-12 MED ORDER — MIDAZOLAM HCL 2 MG/2ML IJ SOLN
1.0000 mg | Freq: Once | INTRAMUSCULAR | Status: AC
  Administered 2020-09-12: 1 mg via INTRAVENOUS

## 2020-09-12 MED ORDER — MIDAZOLAM HCL 2 MG/2ML IJ SOLN
1.0000 mg | INTRAMUSCULAR | Status: DC | PRN
  Administered 2020-09-12: 1 mg via INTRAVENOUS
  Filled 2020-09-12 (×2): qty 2

## 2020-09-12 MED FILL — Sodium Chloride IV Soln 0.9%: INTRAVENOUS | Qty: 250 | Status: AC

## 2020-09-12 MED FILL — Fentanyl Citrate Preservative Free (PF) Inj 2500 MCG/50ML: INTRAMUSCULAR | Qty: 50 | Status: AC

## 2020-09-12 NOTE — Progress Notes (Signed)
[facility] ADULT ICU REPLACEMENT PROTOCOL   The patient does apply for the [facility] Adult ICU Electrolyte Replacment Protocol based on the criteria listed below:   1. Is GFR >/= 30 ml/min? Yes.    Patient's GFR today is 42 2. Is SCr </= 2? Yes.   Patient's SCr is 1.6 ml/kg/hr 3. Did SCr increase >/= 0.5 in 24 hours? No. 4. Abnormal electrolyte(s): Mag 1.7 K+ 3.0 Phos 2.3  (* (5. Ordered repletion with: protocol 6. If a panic level lab has been reported, has the CCM MD in charge been notified? Yes.  .   Physician:  Dr.Ogan  Carlisle Beers 09/12/2020 5:49 AM

## 2020-09-12 NOTE — Progress Notes (Signed)
Spoke with Dr. Ronnald Ramp regarding plan of care. Daughter updated 4/5 at bedside.       Noe Gens, MSN, APRN, NP-C, AGACNP-BC [facility] 09/12/2020, 3:29 PM   Please see [email or web address] for pager details.   From 7A-7P if no response, please call [phone] After hours, please call ELink [phone]

## 2020-09-12 NOTE — Progress Notes (Signed)
NAME:  Devin [name], MRN:  [id number], DOB:  1941-02-17, LOS: 2 ADMISSION DATE:  09/10/2020, CONSULTATION DATE:   REFERRING MD: Triad  CHIEF COMPLAINT:  Chest pain, rib fractures 3/31  History of Present Illness:  80 y/o M, remote smoker, followed by Dr. Chase Caller with mostly restrictive PFTs p LUL lobectomy for adenoca Stage 1a and GOLD 0 COPD by PFT 08/31/20 with prior emphysema on CT so maintained on Spiriva.  At baseline very debilitated due to back pain and a bit unstable on his feet, fell 3/31 while shopping seen by PCP with L rib fx and small effusion Rx with hydrocodone and robaxin but pain became intolerable am 4/3 to [facility] with pain, agitation & SOB. Per paperwork that was brought by daughter, patient has new compression fractures at T11, T12.  He also has a stable L1 compression fracture, extensive lumbar spondylosis and postsurgical changes.  Additionally had a left seventh rib fracture anteriorly and a left pleural effusion without any obvious pneumothorax.  In the ER, the patient was intubated for agitated delirium, pain control.   Significant Hospital Events: Including procedures, antibiotic start and stop dates in addition to other pertinent events   . 4/3 admit with pain, SOB, agitated delirium. Intubated in ER 4/3 for agitation. RVP / COVID / Flu negative. Note daughter reported difficult time waking from anesthesia 07/04/20 after cystoscopy.  Tracheal aspirate sent. . 4/4 Remains on vent, Dr. Ronnald Ramp requesting patient be transferred to [facility] for surgical intervention  Interim History / Subjective:  Afebrile / WBC 13.8  Vent - 30%, PEEP 5  Glucose range 127-159  I/O 363m UOP, +4.1L in last 24 hours  On fentnayl infusion, versed turned off this am  RT reports patient failed SBT this am with low lung volumes (200cc Vt) / poor effort   Objective   Blood pressure (!) 117/54, pulse 97, temperature 98.7 F (37.1 C), temperature source Axillary, resp. rate 16, height 5' 9.02" (1.753  m), weight 88 kg, SpO2 98 %.    Vent Mode: PRVC FiO2 (%):  [30 %] 30 % Set Rate:  [16 bmp-18 bmp] 16 bmp Vt Set:  [560 mL] 560 mL PEEP:  [5 cmH20] 5 cmH20 Plateau Pressure:  [21 cmH20-24 cmH20] 24 cmH20   Intake/Output Summary (Last 24 hours) at 09/12/2020 0907 Last data filed at 09/12/2020 00175Gross per 24 hour  Intake 4548.35 ml  Output 390 ml  Net 4158.35 ml   Filed Weights   09/11/20 0410 09/11/20 0527 09/12/20 0154  Weight: 83.3 kg 83.6 kg 88 kg    Examination: General: elderly adult male lying in bed on vent in NAD  HEENT: MM pink/moist, ETT, eyes open, anicteric Neuro: eyes open, spontaneous movement noted, lungs bilaterally clear CV: s1s2 RRR, SR in 70's on monitor with PVC's, no m/r/g PULM: non-labored on vent, lungs bilaterally coarse GI: soft, bsx4 active  Extremities: warm/dry, trace dependent edema  Skin: no rashes or lesions       Resolved Hospital Problem list      Assessment & Plan:   Agitated Delirium in setting of Pain Baseline Xanax Dependent  -fentanyl gtt for pain, PRN versed for sedation  -delirium prevention measures  -xanax dependent prior to admit   Acute 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 Contusion L lung with ? Hemothorax Small Layering Effusion  -hold eliquis  -continue SCD's, SQ heparin for DVT prophylaxis  -monitor off abx   DM II / IDDM  -SSI, sensitive scale   Thoracic Spine Fx  S/p prior spinal surgery by Dr Sherley Bounds, see CT 4/3. New horizontal fracture through the inferior aspect  of the T11 vertebral body suspicious for an unstable, chance-like fracture in this patient who has been previously fused. The spinal rods are chronically fractured posteriorly at the T11-12 level. -appreciate NSGY evaluation, pending transfer for [facility] at [facility]  -continue TSLO brace for now  -possible NSGY intervention planned for 4/11  AKI  -Trend BMP / urinary output -Avoid nephrotoxic agents, ensure adequate renal perfusion  Hypokalemia  Hypophosphatemia  -monitor, replace  -KPHOS, Mg 4/5   Hypothyroidism  -continue home synthroid   AF on Eliquis  -hold home eliquis  -tele monitoring  -continue reduced dose home atenolol   At Risk Malnutrition  -TF per protocol    Best practice (right click and "Reselect all SmartList Selections" daily)  Diet:  Tube Feed  Pain/Anxiety/Delirium protocol (if indicated): Yes (RASS goal -1) VAP protocol (if indicated): Yes DVT prophylaxis: Subcutaneous Heparin and SCD GI prophylaxis: PPI Glucose control:  SSI Yes Central venous access:  N/A Arterial line:  N/A Foley:  N/A Mobility:  bed rest  PT consulted: N/A Last date of multidisciplinary goals of care discussion: Wife updated 4/4 at bedside.  Code Status:  full code Disposition: ICU   Critical Care Time: 69 minutes    Noe Gens, MSN, APRN, NP-C, AGACNP-BC [facility] [place] [facility] 09/12/2020, 9:07 AM   Please see [email or web address] for pager details.   From 7A-7P if no response, please call [phone] After hours, please call ELink [phone]

## 2020-09-12 NOTE — Progress Notes (Signed)
Pt wife removed ring and took the ring home with her

## 2020-09-13 ENCOUNTER — Other Ambulatory Visit: Payer: Self-pay | Admitting: Neurological Surgery

## 2020-09-13 ENCOUNTER — Other Ambulatory Visit: Payer: Self-pay

## 2020-09-13 DIAGNOSIS — Z902 Acquired absence of lung [part of]: Secondary | ICD-10-CM | POA: Diagnosis not present

## 2020-09-13 DIAGNOSIS — S2232XA Fracture of one rib, left side, initial encounter for closed fracture: Secondary | ICD-10-CM | POA: Diagnosis not present

## 2020-09-13 DIAGNOSIS — S22080D Wedge compression fracture of T11-T12 vertebra, subsequent encounter for fracture with routine healing: Secondary | ICD-10-CM

## 2020-09-13 DIAGNOSIS — J9601 Acute respiratory failure with hypoxia: Secondary | ICD-10-CM | POA: Diagnosis not present

## 2020-09-13 DIAGNOSIS — S22009A Unspecified fracture of unspecified thoracic vertebra, initial encounter for closed fracture: Secondary | ICD-10-CM

## 2020-09-13 LAB — CBC
HCT: 32.7 % — ABNORMAL LOW (ref 39.0–52.0)
Hemoglobin: 10.7 g/dL — ABNORMAL LOW (ref 13.0–17.0)
MCH: 30.5 pg (ref 26.0–34.0)
MCHC: 32.7 g/dL (ref 30.0–36.0)
MCV: 93.2 fL (ref 80.0–100.0)
Platelets: 198 10*3/uL (ref 150–400)
RBC: 3.51 MIL/uL — ABNORMAL LOW (ref 4.22–5.81)
RDW: 14.5 % (ref 11.5–15.5)
WBC: 11.6 10*3/uL — ABNORMAL HIGH (ref 4.0–10.5)
nRBC: 0 % (ref 0.0–0.2)

## 2020-09-13 LAB — BASIC METABOLIC PANEL
Anion gap: 10 (ref 5–15)
BUN: 26 mg/dL — ABNORMAL HIGH (ref 8–23)
CO2: 27 mmol/L (ref 22–32)
Calcium: 7.6 mg/dL — ABNORMAL LOW (ref 8.9–10.3)
Chloride: 94 mmol/L — ABNORMAL LOW (ref 98–111)
Creatinine, Ser: 1.44 mg/dL — ABNORMAL HIGH (ref 0.61–1.24)
GFR, Estimated: 50 mL/min — ABNORMAL LOW (ref >60)
Glucose, Bld: 162 mg/dL — ABNORMAL HIGH (ref 70–99)
Potassium: 2.6 mmol/L — CL (ref 3.5–5.1)
Sodium: 131 mmol/L — ABNORMAL LOW (ref 135–145)

## 2020-09-13 LAB — GLUCOSE, CAPILLARY
Glucose-Capillary: 142 mg/dL — ABNORMAL HIGH (ref 70–99)
Glucose-Capillary: 175 mg/dL — ABNORMAL HIGH (ref 70–99)
Glucose-Capillary: 177 mg/dL — ABNORMAL HIGH (ref 70–99)
Glucose-Capillary: 252 mg/dL — ABNORMAL HIGH (ref 70–99)
Glucose-Capillary: 260 mg/dL — ABNORMAL HIGH (ref 70–99)
Glucose-Capillary: 182 mg/dL — ABNORMAL HIGH (ref 70–99)

## 2020-09-13 MED ORDER — POTASSIUM CHLORIDE 20 MEQ PO PACK
40.0000 meq | PACK | Freq: Three times a day (TID) | ORAL | Status: AC
  Administered 2020-09-13 (×3): 40 meq
  Filled 2020-09-13 (×3): qty 2

## 2020-09-13 MED ORDER — LACTATED RINGERS IV BOLUS
1000.0000 mL | Freq: Once | INTRAVENOUS | Status: AC
  Administered 2020-09-13: 1000 mL via INTRAVENOUS

## 2020-09-13 MED ORDER — POTASSIUM CHLORIDE 10 MEQ/100ML IV SOLN
10.0000 meq | INTRAVENOUS | Status: AC
  Administered 2020-09-13 (×5): 10 meq via INTRAVENOUS
  Filled 2020-09-13 (×5): qty 100

## 2020-09-13 MED ORDER — SODIUM CHLORIDE 0.9 % IV SOLN
INTRAVENOUS | Status: DC | PRN
  Administered 2020-09-23 – 2020-10-06 (×2): 250 mL via INTRAVENOUS

## 2020-09-13 NOTE — Progress Notes (Signed)
Pt placed on PSV 10/8 by MD. Pt is tolerating well at this time. RT to continue to monitor.

## 2020-09-13 NOTE — Progress Notes (Signed)
NAME:  Devin [name], MRN:  [id number], DOB:  02-11-1941, LOS: 3 ADMISSION DATE:  09/10/2020, CONSULTATION DATE:  09/12/20 REFERRING MD:  [facility], CHIEF COMPLAINT:  T11-12 Chance Fracture c/b acute on chronic hypercapnic RF   History of Present Illness:  Devin [name] is a 80 y.o. male here for medical stabilization prior to pursuing spine stabilization neurosurgery (connecting prior T7-11 and T11-L4 rods) following a fall with a relevant PMHx of restrictive lung disease, emphysema/COPD, adenocarcinoma s/p LUL lobectomy (2016), ankylosing spondylitis, Crohn's, Afib, nephrolithiasis, HTN, DM, obesity, and thyroid and prostate cancer (see below). Per chart review, patient fell at a bookstore 3/31. New onset back pain - denied head trauma or LOC at that time. Saw PCP on 4/1 and underwent thoracic, lumbar, and chest x-ray. Found to have new T11 Chance fracture.He also has a stable L1 compression fracture, extensive lumbar spondylosis and postsurgical changes. Additionally had a left seventh rib fracture anteriorly and a left pleural effusion without any obvious pneumothorax. Patient was intubated in the ER on 4/3 for agitation and hypoxemia. Was transferred to [facility] for cardiopulmonary stabilization and eventual neurosurgical intervention.   Pertinent  Medical History  Restrictive lung disease (likely 2/2 prior opioid use for pain management and posture), emphysema, COPD, adenocarcinoma s/p LUL lobectomy (2016), Ankylosing spondylitis, Crohn's, Psoriasis, Afib, nephrolithiasis, HTN, DM, obesity, thyroid cancer s/p RAI and total thyroidectomy (1999), and prostate cancer s/p prostatectomy (2012) but with recurrence.      Significant Hospital Events: Including procedures, antibiotic start and stop dates in addition to other pertinent events    4/3 - Intubated in ER for agitation   4/3 - Resp viral panel PCR Neg  4/3 - Respiratory culture from ET sent  4/3 - Blood Cx, negative at 48 hrs    4/6 - Min Vent requirements    Interim History / Subjective:  Patient is lethargic in the morning but responds to commands appropriately. Later in the morning on rounds was able to be weaned to pressure support ventilation with spontaneous breaths. Has intermittent apnea but is able to be aroused and will breath spontaneously when prompted. Wife at bedside aiding in care.    Objective   Blood pressure (!) 99/54, pulse 80, temperature 99.4 F (37.4 C), temperature source Axillary, resp. rate 16, height 5' 9.02" (1.753 m), weight 88 kg, SpO2 96 %.    Vent Mode: PRVC FiO2 (%):  [30 %] 30 % Set Rate:  [16 bmp] 16 bmp Vt Set:  [560 mL] 560 mL PEEP:  [5 cmH20] 5 cmH20 Pressure Support:  [20 cmH20] 20 cmH20 Plateau Pressure:  [15 cmH20-27 cmH20] 15 cmH20   Intake/Output Summary (Last 24 hours) at 09/13/2020 1023 Last data filed at 09/13/2020 0800 Gross per 24 hour  Intake 3722.87 ml  Output 1830 ml  Net 1892.87 ml   Filed Weights   09/11/20 0410 09/11/20 0527 09/12/20 0154  Weight: 83.3 kg 83.6 kg 88 kg    Examination: General: Chronically ill appearing, skin appears fragile with multiple bruises, abrasions HENT: MMM, ETT in place  Lungs: Rhoncorous breath sounds in the anterior bilateral fields Cardiovascular: RRR, S1/S2, no m/r/g, no peripheral or trace edema  Abdomen: soft, NTND, bowel sounds normoactive Extremities: multiple abrasions and fragile skin per above Neuro: Lethargic, 3/5 strength in the UE and LE, follows commands appropriately bilaterally, able to protrude tongue, PERRL, extraocular movement mostly intact as patient looked down but did not participate in lateral gaze requests  GU: deferred  Labs/imaging that  I havepersonally reviewed  (right click and "Reselect all SmartList Selections" daily)  BMP CBC LFTs CXR  Resolved Hospital Problem list   N/A  Assessment & Plan:  Devin [name] is a 80 y.o. male here for medical stabilization prior to pursuing  spine stabilization neurosurgery (connecting prior T7-11 and T11-L4 rods) following a fall with a relevant PMHx of restrictive lung disease (likely 2/2 prior opioid use for pain management and posture), emphysema/COPD, adenocarcinoma s/p LUL lobectomy (2016), Ankylosing spondylitis, Crohn's, Psoriasis, Afib, nephrolithiasis, HTN, DM, obesity, thyroid cancer s/p RAI and total thyroidectomy (1999), and prostate cancer s/p prostatectomy (2012) but with recurrence. Patient is HDS and is able to breath spontaneously with minimal ventilator pressure support; therefore, pt is medically stable for surgical intervention at this time. At this time, will not completely wean from vent given expectation for future surgical intervention.   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 Intermittent apnea but will resume breaths when prompted. Will leave ventilator to support versus control settings for a period this afternoon to prevent diaphragm and respiration muscle deconditioning.  - Resume PRVC mode at night or per RT  - POCUS did not demonstrate a large enough pleural effusion for thoracentesis  - CTM   Intermittent Bradycardia a/w Hypotension ON patient had intermittent episodes of bradycardia (50s) that were a/w with hypotension (sys 100s) likely 2/2 decreased CO.  - D/c and hold home atenolol   Leukocytosis Per chart review, patient had a leukocytosis on 4/1 and was started on augmentin. No signs of current infection requiring Abx therapy despite continued mildly elevated WBC (13 > 13.8). Leukocytosis likely 2/2 to atelectasis versus infectious process.   - Afebrile but is receiving tylenol 541m q6hr  - Blood Cx negative at 48 hrs  - RespPP negative  - Per chart review, daughter reported no clear source of infxn when abx was started previously  - CTM   Hypokalemia  Hyponatremia Episodic hypokalemia on  4/6. Similarly, evidence of hyponatremia based on 4/4-4/5 trend.  - Replete K  - D/c hypotonic fluids     Best practice (right click and "Reselect all SmartList Selections" daily)  Diet:  NPO Pain/Anxiety/Delirium protocol (if indicated): Yes (RASS goal 0) VAP protocol (if indicated): Yes DVT prophylaxis: SCD GI prophylaxis: PPI Glucose control:  SSI Yes Central venous access:  N/A Arterial line:  N/A Foley:  Yes, and it is still needed Mobility:  OOB  PT consulted: N/A Last date of multidisciplinary goals of care discussion [TBD] Code Status:  full code Disposition: ICU  Labs   CBC: Recent Labs  Lab 09/10/20 1031 09/11/20 0227 09/12/20 0240  WBC 14.1* 13.0* 13.8*  NEUTROABS 12.3*  --   --   HGB 12.8* 12.7* 11.1*  HCT 41.8 39.9 34.2*  MCV 96.8 95.9 94.5  PLT 251 220 2315   Basic Metabolic Panel: Recent Labs  Lab 09/10/20 1031 09/11/20 0227 09/11/20 1337 09/11/20 1739 09/12/20 0240 09/12/20 1701  NA 135 134*  --   --  131*  --   K 3.7 3.5  --   --  3.0*  --   CL 90* 93*  --   --  94*  --   CO2 36* 30  --   --  27  --   GLUCOSE 189* 89  --   --  119*  --   BUN 16 18  --   --  28*  --  CREATININE 1.15 1.23  --   --  1.66*  --   CALCIUM 8.6* 8.3*  --   --  7.3*  --   MG  --   --  1.4* 1.9 1.7 1.9  PHOS  --   --  <1.0* 1.1* 2.3* 2.8   GFR: Estimated Creatinine Clearance: 40.9 mL/min (A) (by C-G formula based on SCr of 1.66 mg/dL (H)). Recent Labs  Lab 09/10/20 1031 09/10/20 1043 09/10/20 1533 09/11/20 0227 09/12/20 0240  WBC 14.1*  --   --  13.0* 13.8*  LATICACIDVEN  --  1.0 2.7*  --   --     Liver Function Tests: Recent Labs  Lab 09/10/20 1031  AST 24  ALT 21  ALKPHOS 66  BILITOT 0.8  PROT 6.7  ALBUMIN 3.6   Recent Labs  Lab 09/10/20 1031  LIPASE 21   No results for input(s): AMMONIA in the last 168 hours.  ABG    Component Value Date/Time   PHART 7.570 (H) 09/10/2020 1358   PCO2ART 35.2 09/10/2020 1358   PO2ART 148 (H)  09/10/2020 1358   HCO3 32.3 (H) 09/10/2020 1358   TCO2 23.0 03/17/2015 0540   ACIDBASEDEF 3.6 (H) 03/17/2015 0540   O2SAT 98.6 09/10/2020 1358     Coagulation Profile: No results for input(s): INR, PROTIME in the last 168 hours.  Cardiac Enzymes: No results for input(s): CKTOTAL, CKMB, CKMBINDEX, TROPONINI in the last 168 hours.  HbA1C: Hgb A1c MFr Bld  Date/Time Value Ref Range Status  09/10/2020 03:33 PM 6.4 (H) 4.8 - 5.6 % Final    Comment:    (NOTE) Pre diabetes:          5.7%-6.4%  Diabetes:              >6.4%  Glycemic control for   <7.0% adults with diabetes   07/03/2020 02:46 PM 6.2 (H) 4.8 - 5.6 % Final    Comment:    (NOTE) Pre diabetes:          5.7%-6.4%  Diabetes:              >6.4%  Glycemic control for   <7.0% adults with diabetes     CBG: Recent Labs  Lab 09/12/20 1841 09/12/20 2023 09/12/20 2313 09/13/20 0325 09/13/20 0754  GLUCAP 133* 136* 165* 142* 175*    Review of Systems:     Past Medical History:  He,  has a past medical history of A-fib ([place]), Adenocarcinoma of left lung, stage 1 ([place]) (03/10/2015), Ankylosing spondylitis ([place]), Crohn's disease ([place]), Gout, HIstory of basal cell cancer of face, History of kidney stones, Hypertension, Hypothyroidism, Impotence, 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   Surgical History:   Past Surgical History:  Procedure Laterality Date  . ABDOMINAL EXPLORATION SURGERY     for small bowel obstruction  . APPENDECTOMY  03/2007  . BACK SURGERY    . BASAL CELL CARCINOMA EXCISION  "several"   "head"  . CARDIAC CATHETERIZATION  03/17/2003  . CHOLECYSTECTOMY N/A 05/07/2016   Procedure: LAPAROSCOPIC CHOLECYSTECTOMY;  Surgeon: Fanny Skates, MD;  Location: [place] [facility];  Service: General;  Laterality: N/A;  . COLON SURGERY  03/2007    Resection of cecum, appendix, terminal ileum (approximately/notes 10/10/2010  . CYSTOSCOPY/URETEROSCOPY/HOLMIUM LASER/STENT PLACEMENT Right 07/03/2020   Procedure: CYSTOSCOPY/RETROGRADE/URETEROSCOPY REMOVAL OF BLADDER STONE;  Surgeon: Raynelle Bring, MD;  Location: [facility];  Service: Urology;  Laterality: Right;  . HERNIA REPAIR    . LAPAROSCOPIC CHOLECYSTECTOMY  05/07/2016  . LAPAROSCOPIC LYSIS OF ADHESIONS  05/07/2016  . LAPAROSCOPIC LYSIS OF ADHESIONS N/A 05/07/2016   Procedure: LAPAROSCOPIC LYSIS OF ADHESIONS TIMES ONE HOUR;  Surgeon: Fanny Skates, MD;  Location: [facility];  Service: General;  Laterality: N/A;  . POSTERIOR FUSION THORACIC SPINE  02/08/2016   1. Posterior thoracic arthrodesis T7-T11 utilizing morcellized allograft, 2. Posterior thoracic segmental fixation T7-T11 utilizing nuvasive pedicle screws  . PROSTATECTOMY  06/2001   w/bilateral pelvic lymph nose dissection/notes 10/24/2010  . SPINAL FUSION  12/2014   Open reduction internal fixation of L1 Chance fracture with posterior fusion T10-L4 utilizing morcellized allograft and some local autograft, segmental instrumentation T10-L4 inclusive utilizing nuvasive pedicle screws/notes 12/16/2014  . Stress Cardiolite  02/17/2003  . THOROCOTOMY WITH LOBECTOMY  03/16/2015   Procedure: THOROCOTOMY WITH LOBECTOMY;  Surgeon: Ivin Poot, MD;  Location: [facility];  Service: Thoracic;;  . TONSILLECTOMY    . TOTAL THYROIDECTOMY  1997  . Venous Doppler  05/30/2004  . VENTRAL HERNIA REPAIR  04/14/2008  . VIDEO ASSISTED THORACOSCOPY Left 03/16/2015   Procedure: VIDEO ASSISTED THORACOSCOPY;  Surgeon: Ivin Poot, MD;  Location: [facility];  Service: Thoracic;  Laterality: Left;     Social History:   reports that he quit smoking about 30 years ago. His smoking use included cigarettes. He has a 60.00 pack-year smoking history. He has never used smokeless tobacco. He reports previous alcohol use. He reports that he does not use drugs.   Family History:   His family history includes CAD in his mother. There is no history of Cancer.   Allergies No Known Allergies   Home Medications  Prior to Admission medications   Medication Sig Start Date End Date Taking? Authorizing Provider  albuterol (VENTOLIN HFA) 108 (90 Base) MCG/ACT inhaler Inhale 2 puffs into the lungs every 6 (six) hours as needed for wheezing or shortness of breath. 08/31/20  Yes Brand Males, MD  ALPRAZolam Duanne Moron) 0.5 MG tablet Take 0.5 mg by mouth 2 (two) times daily as needed for anxiety. 02/29/20  Yes [provider]  amoxicillin-clavulanate (AUGMENTIN) 875-125 MG tablet Take 1 tablet by mouth every 12 (twelve) hours. 09/08/20  Yes [provider]  apixaban (ELIQUIS) 5 MG TABS tablet Take 5 mg by mouth 2 (two) times daily.   Yes [provider]  atenolol (TENORMIN) 25 MG tablet Take 1 tablet (25 mg total) by mouth 2 (two) times daily. 02/09/20  Yes Thurnell Lose, MD  busPIRone (BUSPAR) 15 MG tablet Take 15 mg by mouth 2 (two) times daily.  05/17/16  Yes [provider]  calcium-vitamin D (OSCAL WITH D) 500-200 MG-UNIT tablet Take 2 tablets by mouth daily.    Yes [provider]  hydrochlorothiazide (HYDRODIURIL) 12.5 MG tablet Take 12.5 mg by mouth every morning. 06/06/20  Yes [provider]  HYDROcodone-acetaminophen (NORCO/VICODIN) 5-325 MG tablet Take 1 tablet by mouth every 6 (six) hours as needed (pain). 09/08/20  Yes [provider]  insulin lispro (HUMALOG KWIKPEN) 100 UNIT/ML KiwkPen INJECT UNDER THE SKIN THREE TIMES DAILY( 25 UNITS, 25 UNITS AND 20 UNITS RESPECTIVELY BEFORE MEALS) Patient taking differently: Inject 20-30 Units into the skin See admin instructions. Inject 25 units subcutaneously before breakfast, 20 units before lunch and 30 units before supper 05/17/16  Yes Weber, Damaris Hippo, PA-C  levothyroxine (SYNTHROID) 125 MCG tablet Take 1 tablet (125 mcg total)  by mouth daily before breakfast. 02/10/20  Yes  Thurnell Lose, MD  Mesalamine 800 MG TBEC Take 1,600 mg by mouth 2 (two) times daily.   Yes [provider]  methocarbamol (ROBAXIN) 500 MG tablet Take 500 mg by mouth every 8 (eight) hours. 09/08/20  Yes [provider]  Multiple Vitamins-Iron (DAILY-VITE/IRON/BETA-CAROTENE) TABS Take 1 tablet by mouth daily.    Yes [provider]  Tiotropium Bromide Monohydrate (SPIRIVA RESPIMAT) 2.5 MCG/ACT AERS Inhale 2 puffs into the lungs daily. 08/31/20  Yes Brand Males, MD  zinc gluconate 50 MG tablet Take 50 mg by mouth daily.   Yes [provider]  B-D ULTRAFINE III SHORT PEN 31G X 8 MM MISC USE 1 PEN NEEDLE FOUR TIMES DAILY. 09/18/16   Ivar Drape D, PA  diltiazem (CARDIZEM) 30 MG tablet Take 1 tablet every 4 hours AS NEEDED for heart rate >100 as long as blood pressure >100. Patient not taking: No sig reported 03/30/20   Fenton, Clint R, PA  Glucose Blood (ASCENSIA CONTOUR TEST VI) Use as directed to test two times a day    [provider]  glucose blood (BAYER CONTOUR TEST) test strip TEST BLOOD SUGAR TWICE DAILY AS DIRECTED. 05/17/16   Gale Journey, Sarah L, PA-C  glucose blood test strip 1 each by Other route 2 times daily. 05/17/16   [provider]  Insulin Pen Needle 31G X 8 MM MISC Inject 31 g into the skin. 09/18/16   [provider]  leuprolide (LUPRON) 11.25 MG injection Inject 11.25 mg into the muscle every 6 (six) months.     [provider]     Critical care time: 30 minutes    Domenick Bookbinder, MS4

## 2020-09-13 NOTE — Progress Notes (Signed)
I stop by and saw the patient today and spoke with his wife at length.  He remains on the ventilator and has been unable to wean from the ventilator.  He was moving his hands and feet while we were talking.  He is in his brace in the bed sitting up somewhat.  He really does not look good.  If he is going to survive and become extubatable, then he is going to need stabilization surgery connecting the 2 rods systems.  Again they are not broken, it is 2 separate fixation systems from T7-T11 and T11-L4 with a separate rod systems.  The plan of surgery would be simply to place rod links to link the 2 rods together.  I explained this to his wife.  Timing of the surgery is more difficult matter since we certainly do not want him to be extubated and then reintubated for surgery, but there is no real time in the operating room until Monday unless we do this late at night some time, or potentially if he could be stable for surgery tomorrow morning at 730 potentially or late tomorrow afternoon.  So the timing has to be worked out.  She understands this.  I will continue to follow.

## 2020-09-13 NOTE — Progress Notes (Signed)
Pt placed back on full vent support due to periods of apnea. RN at bedside. RT to continue to monitor.

## 2020-09-14 ENCOUNTER — Other Ambulatory Visit: Payer: Self-pay | Admitting: Neurological Surgery

## 2020-09-14 DIAGNOSIS — J942 Hemothorax: Secondary | ICD-10-CM | POA: Diagnosis not present

## 2020-09-14 DIAGNOSIS — J9622 Acute and chronic respiratory failure with hypercapnia: Secondary | ICD-10-CM | POA: Diagnosis not present

## 2020-09-14 DIAGNOSIS — J9621 Acute and chronic respiratory failure with hypoxia: Secondary | ICD-10-CM | POA: Diagnosis not present

## 2020-09-14 DIAGNOSIS — S2232XA Fracture of one rib, left side, initial encounter for closed fracture: Secondary | ICD-10-CM | POA: Diagnosis not present

## 2020-09-14 LAB — CBC
HCT: 34.2 % — ABNORMAL LOW (ref 39.0–52.0)
Hemoglobin: 11.3 g/dL — ABNORMAL LOW (ref 13.0–17.0)
MCH: 30.7 pg (ref 26.0–34.0)
MCHC: 33 g/dL (ref 30.0–36.0)
MCV: 92.9 fL (ref 80.0–100.0)
Platelets: 222 10*3/uL (ref 150–400)
RBC: 3.68 MIL/uL — ABNORMAL LOW (ref 4.22–5.81)
RDW: 14.5 % (ref 11.5–15.5)
WBC: 12 10*3/uL — ABNORMAL HIGH (ref 4.0–10.5)
nRBC: 0 % (ref 0.0–0.2)

## 2020-09-14 LAB — BASIC METABOLIC PANEL
Anion gap: 5 (ref 5–15)
BUN: 21 mg/dL (ref 8–23)
CO2: 27 mmol/L (ref 22–32)
Calcium: 7.9 mg/dL — ABNORMAL LOW (ref 8.9–10.3)
Chloride: 100 mmol/L (ref 98–111)
Creatinine, Ser: 1.25 mg/dL — ABNORMAL HIGH (ref 0.61–1.24)
GFR, Estimated: 59 mL/min — ABNORMAL LOW (ref >60)
Glucose, Bld: 198 mg/dL — ABNORMAL HIGH (ref 70–99)
Potassium: 4.4 mmol/L (ref 3.5–5.1)
Sodium: 132 mmol/L — ABNORMAL LOW (ref 135–145)

## 2020-09-14 LAB — GLUCOSE, CAPILLARY
Glucose-Capillary: 191 mg/dL — ABNORMAL HIGH (ref 70–99)
Glucose-Capillary: 236 mg/dL — ABNORMAL HIGH (ref 70–99)
Glucose-Capillary: 217 mg/dL — ABNORMAL HIGH (ref 70–99)
Glucose-Capillary: 258 mg/dL — ABNORMAL HIGH (ref 70–99)
Glucose-Capillary: 228 mg/dL — ABNORMAL HIGH (ref 70–99)
Glucose-Capillary: 212 mg/dL — ABNORMAL HIGH (ref 70–99)

## 2020-09-14 MED ORDER — INSULIN GLARGINE 100 UNIT/ML ~~LOC~~ SOLN
5.0000 [IU] | Freq: Two times a day (BID) | SUBCUTANEOUS | Status: DC
  Administered 2020-09-14 – 2020-09-15 (×4): 5 [IU] via SUBCUTANEOUS
  Filled 2020-09-14 (×6): qty 0.05

## 2020-09-14 NOTE — Progress Notes (Signed)
Inpatient Diabetes Program Recommendations  AACE/ADA: New Consensus Statement on Inpatient Glycemic Control (2015)  Target Ranges:  Prepandial:   less than 140 mg/dL      Peak postprandial:   less than 180 mg/dL (1-2 hours)      Critically ill patients:  140 - 180 mg/dL   Lab Results  Component Value Date   GLUCAP 236 (H) 09/14/2020   HGBA1C 6.4 (H) 09/10/2020    Review of Glycemic Control Results for BRIDGET, WESTBROOKS" (MRN [id number]) as of 09/14/2020 10:56  Ref. Range 09/13/2020 15:31 09/13/2020 20:07 09/13/2020 23:18 09/14/2020 03:30 09/14/2020 07:00  Glucose-Capillary Latest Ref Range: 70 - 99 mg/dL 252 (H) 260 (H) 182 (H) 191 (H) 236 (H)   Diabetes history: DM 2 Outpatient Diabetes medications:  Humalog 75/25 - 25 units q AM, Humalog 75/25- 20 units with lunch and Humalog 75/25- 30 units with supper Current orders for Inpatient glycemic control:  Lantus 5 units bid Novolog sensitive q 4 hours Vital 65 ml/hr Inpatient Diabetes Program Recommendations:   Please consider increasing Lantus to 10 units bid.  Also please add Novolog 3 units q 4 hours to cover tube feeds.   Thanks,  Adah Perl, RN, BC-ADM Inpatient Diabetes Coordinator Pager [phone] (8a-5p)

## 2020-09-14 NOTE — Progress Notes (Signed)
Subjective: Patient still intubate and sedated but opens eyes and responds appropriately.   Objective: Vital signs in last 24 hours: Temp:  [98.5 F (36.9 C)-99.7 F (37.6 C)] 98.5 F (36.9 C) (04/07 0400) Pulse Rate:  [62-95] 78 (04/07 0722) Resp:  [12-31] 16 (04/07 0722) BP: (77-183)/(36-142) 134/62 (04/07 0722) SpO2:  [93 %-100 %] 100 % (04/07 0727) FiO2 (%):  [30 %] 30 % (04/07 0727) Weight:  [88 kg] 88 kg (04/07 0500)  Intake/Output from previous day: 04/06 0701 - 04/07 0700 In: 2889.9 [I.V.:209.9; NG/GT:2180; IV Piggyback:500] Out: 2175 [Urine:1675; Stool:500] Intake/Output this shift: No intake/output data recorded.    Lab Results: Lab Results  Component Value Date   WBC 12.0 (H) 09/14/2020   HGB 11.3 (L) 09/14/2020   HCT 34.2 (L) 09/14/2020   MCV 92.9 09/14/2020   PLT 222 09/14/2020   Lab Results  Component Value Date   INR 1.01 02/07/2016   BMET Lab Results  Component Value Date   NA 132 (L) 09/14/2020   K 4.4 09/14/2020   CL 100 09/14/2020   CO2 27 09/14/2020   GLUCOSE 198 (H) 09/14/2020   BUN 21 09/14/2020   CREATININE 1.25 (H) 09/14/2020   CALCIUM 7.9 (L) 09/14/2020    Studies/Results: No results found.  Assessment/Plan: Continue vent management per  Ccm. He apparently was only able to wean for two hours yesterday and then got put back on full support. Will hold off on any surgical interventions at this time until he is stable from a pulmonary standpoint.    LOS: 4 days    Ocie Cornfield Wilberta Dorvil 09/14/2020, 8:04 AM

## 2020-09-14 NOTE — Progress Notes (Signed)
NAME:  Devin [name], MRN:  [id number], DOB:  1940/09/24, LOS: 4 ADMISSION DATE:  09/10/2020, CONSULTATION DATE:  09/12/20 REFERRING MD:  [facility], CHIEF COMPLAINT:  T11-12 Chance Fracture c/b acute on chronic hypercapnic RF   History of Present Illness:  Devin [name] is a 80 y.o. male here for medical stabilization prior to pursuing spine stabilization neurosurgery (connecting prior T7-11 and T11-L4 rods) following a fall with a relevant PMHx of restrictive lung disease, emphysema/COPD, adenocarcinoma s/p LUL lobectomy (2016), ankylosing spondylitis, Crohn's, Afib, nephrolithiasis, HTN, DM, obesity, and thyroid and prostate cancer (see below). Per chart review, patient fell at a bookstore 3/31. New onset back pain - denied head trauma or LOC at that time. Saw PCP on 4/1 and underwent thoracic, lumbar, and chest x-ray. Found to have new T11 Chance fracture.He also has a stable L1 compression fracture, extensive lumbar spondylosis and postsurgical changes. Additionally had a left seventh rib fracture anteriorly and a left pleural effusion without any obvious pneumothorax. Patient was intubated in the ER on 4/3 for agitation and hypoxemia. Was transferred to [facility] for cardiopulmonary stabilization and eventual neurosurgical intervention. Patient HDS and ready for surgery per medical point-of-view.   Pertinent  Medical History  Restrictive lung disease (likely 2/2 prior opioid use for pain management and posture), emphysema, COPD, adenocarcinoma s/p LUL lobectomy (2016), Ankylosing spondylitis, Crohn's, Psoriasis, Afib, nephrolithiasis, HTN, DM, obesity, thyroid cancer s/p RAI and total thyroidectomy (1999), and prostate cancer s/p prostatectomy (2012) but with recurrence.      Significant Hospital Events: Including procedures, antibiotic start and stop dates in addition to other pertinent events    4/3 - Intubated in ER for agitation   4/3 - Resp viral panel PCR Neg  4/3 - Respiratory  culture from ET sent  4/3 - Blood Cx, negative at 48 hrs   4/6 - Min Vent requirements    Interim History / Subjective:  NAEON. Patient was unable to wean from vent support after 2 hrs on PSV 2/2 apnea. Will try again today. Patient on intermittent fentanyl for agitation but is cooperative and responsive this morning. Patient denies pain this morning and acknowledges being comfortable in current position.   Objective   Blood pressure 115/67, pulse 99, temperature 98.5 F (36.9 C), temperature source Axillary, resp. rate 16, height 5' 9.02" (1.753 m), weight 88 kg, SpO2 100 %.    Vent Mode: PRVC FiO2 (%):  [30 %] 30 % Set Rate:  [16 bmp] 16 bmp Vt Set:  [560 mL] 560 mL PEEP:  [5 cmH20-8 cmH20] 5 cmH20 Pressure Support:  [10 cmH20] 10 cmH20 Plateau Pressure:  [16 cmH20-22 cmH20] 22 cmH20   Intake/Output Summary (Last 24 hours) at 09/14/2020 0944 Last data filed at 09/14/2020 0800 Gross per 24 hour  Intake 2656.38 ml  Output 2175 ml  Net 481.38 ml   Filed Weights   09/11/20 0527 09/12/20 0154 09/14/20 0500  Weight: 83.6 kg 88 kg 88 kg    Examination: General: Chronically ill appearing, skin appears fragile with multiple bruises, abrasions HENT: MMM, ETT in place  Lungs: Rhoncorous breath sounds in the anterior bilateral fields Cardiovascular: RRR, S1/S2, no m/r/g, no peripheral or trace edema  Abdomen: soft, NTND Extremities: multiple abrasions and fragile skin per above Neuro: Lethargic, 3/5 strength in the UE and LE, follows commands appropriately bilaterally, PERRL, extraocular movement appears to be mostly intact but not formally assessed given patient participation  GU: deferred  Labs/imaging that I havepersonally reviewed  (right click  and "Reselect all SmartList Selections" daily)  BMP CBC  Resolved Hospital Problem list   N/A  Assessment & Plan:  Devin [name] is a 80 y.o. male here for medical stabilization prior to pursuing spine stabilization neurosurgery  (connecting prior T7-11 and T11-L4 rods) following a fall with a relevant PMHx of restrictive lung disease (likely 2/2 prior opioid use for pain management and posture), emphysema/COPD, adenocarcinoma s/p LUL lobectomy (2016), Ankylosing spondylitis, Crohn's, Psoriasis, Afib, nephrolithiasis, HTN, DM, obesity, thyroid cancer s/p RAI and total thyroidectomy (1999), and prostate cancer s/p prostatectomy (2012) but with recurrence. Patient is HDS and is able to breath spontaneously with minimal ventilator pressure support c/b intermittent episodes of apnea; pt remains medically stable for surgical intervention at this time.    Acute on Chronic Hypercapnic Respiratory Failure Pt maintained on minimal respiration support: spontaneously breathing with pressure support, which is turned off for apneic episodes. Intermittent apnea but will resume breaths when prompted. Apnea likely 2/2 decreased resp effort given back pain.  - Resume PRVC  Intermittent Hypotension ON patient had intermittent episodes of hypotension (sys high 80s) likely 2/2 sedation.  - Fentanyl PRN   Hyponatremia Remains hyponatremia (NA: 131 > 132). Likely due to poor urine concentration potentially 2/2 SIADH given pulmonary disease. Potassium repleted yesterday.   - CTM  - Do not restart/give hypotonic fluids   Type 1 Diabetes - Continue w/ Lantus and SSI  - Continue tube feeds   Leukocytosis Per chart review, patient had a leukocytosis on 4/1 and was started on augmentin. No signs of current infection requiring Abx therapy as WBC downtrending (13 > 13.8). Leukocytosis likely 2/2 to atelectasis from decreased resp effort versus infectious process.   - Afebrile but is receiving tylenol 553m q6hr  - Blood Cx negative at 72 hrs  - RespPP negative  - Per chart review, daughter reported no clear source of infxn when abx was started previously  - CTM    Best practice (right click and "Reselect all SmartList Selections" daily)   Diet:  NPO Pain/Anxiety/Delirium protocol (if indicated): Yes (RASS goal 0) VAP protocol (if indicated): Yes DVT prophylaxis: SCD GI prophylaxis: PPI Glucose control:  SSI Yes Central venous access:  N/A Arterial line:  N/A Foley:  Yes, and it is still needed Mobility:  OOB  PT consulted: N/A Last date of multidisciplinary goals of care discussion [TBD] Code Status:  full code Disposition: ICU  Labs   CBC: Recent Labs  Lab 09/10/20 1031 09/11/20 0227 09/12/20 0240 09/13/20 0225 09/14/20 0026  WBC 14.1* 13.0* 13.8* 11.6* 12.0*  NEUTROABS 12.3*  --   --   --   --   HGB 12.8* 12.7* 11.1* 10.7* 11.3*  HCT 41.8 39.9 34.2* 32.7* 34.2*  MCV 96.8 95.9 94.5 93.2 92.9  PLT 251 220 225 198 2528   Basic Metabolic Panel: Recent Labs  Lab 09/10/20 1031 09/11/20 0227 09/11/20 1337 09/11/20 1739 09/12/20 0240 09/12/20 1701 09/13/20 0225 09/14/20 0026  NA 135 134*  --   --  131*  --  131* 132*  K 3.7 3.5  --   --  3.0*  --  2.6* 4.4  CL 90* 93*  --   --  94*  --  94* 100  CO2 36* 30  --   --  27  --  27 27  GLUCOSE 189* 89  --   --  119*  --  162* 198*  BUN 16 18  --   --  28*  --  26* 21  CREATININE 1.15 1.23  --   --  1.66*  --  1.44* 1.25*  CALCIUM 8.6* 8.3*  --   --  7.3*  --  7.6* 7.9*  MG  --   --  1.4* 1.9 1.7 1.9  --   --   PHOS  --   --  <1.0* 1.1* 2.3* 2.8  --   --    GFR: Estimated Creatinine Clearance: 54.3 mL/min (A) (by C-G formula based on SCr of 1.25 mg/dL (H)). Recent Labs  Lab 09/10/20 1043 09/10/20 1533 09/11/20 0227 09/12/20 0240 09/13/20 0225 09/14/20 0026  WBC  --   --  13.0* 13.8* 11.6* 12.0*  LATICACIDVEN 1.0 2.7*  --   --   --   --     Liver Function Tests: Recent Labs  Lab 09/10/20 1031  AST 24  ALT 21  ALKPHOS 66  BILITOT 0.8  PROT 6.7  ALBUMIN 3.6   Recent Labs  Lab 09/10/20 1031  LIPASE 21   No results for input(s): AMMONIA in the last 168 hours.  ABG    Component Value Date/Time   PHART 7.570 (H) 09/10/2020 1358    PCO2ART 35.2 09/10/2020 1358   PO2ART 148 (H) 09/10/2020 1358   HCO3 32.3 (H) 09/10/2020 1358   TCO2 23.0 03/17/2015 0540   ACIDBASEDEF 3.6 (H) 03/17/2015 0540   O2SAT 98.6 09/10/2020 1358     Coagulation Profile: No results for input(s): INR, PROTIME in the last 168 hours.  Cardiac Enzymes: No results for input(s): CKTOTAL, CKMB, CKMBINDEX, TROPONINI in the last 168 hours.  HbA1C: Hgb A1c MFr Bld  Date/Time Value Ref Range Status  09/10/2020 03:33 PM 6.4 (H) 4.8 - 5.6 % Final    Comment:    (NOTE) Pre diabetes:          5.7%-6.4%  Diabetes:              >6.4%  Glycemic control for   <7.0% adults with diabetes   07/03/2020 02:46 PM 6.2 (H) 4.8 - 5.6 % Final    Comment:    (NOTE) Pre diabetes:          5.7%-6.4%  Diabetes:              >6.4%  Glycemic control for   <7.0% adults with diabetes     CBG: Recent Labs  Lab 09/13/20 1531 09/13/20 2007 09/13/20 2318 09/14/20 0330 09/14/20 0700  GLUCAP 252* 260* 182* 191* 236*    Review of Systems:     Past Medical History:  He,  has a past medical history of A-fib ([place]), Adenocarcinoma of left lung, stage 1 ([place]) (03/10/2015), Ankylosing spondylitis ([place]), Crohn's disease ([place]), Gout, HIstory of basal cell cancer of face, History of kidney stones, Hypertension, Hypothyroidism, Impotence, 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   Surgical History:   Past Surgical History:  Procedure Laterality Date  . ABDOMINAL EXPLORATION SURGERY     for small bowel obstruction  . APPENDECTOMY  03/2007  . BACK SURGERY    . BASAL CELL CARCINOMA EXCISION  "several"   "head"  . CARDIAC CATHETERIZATION  03/17/2003  . CHOLECYSTECTOMY N/A 05/07/2016   Procedure: LAPAROSCOPIC CHOLECYSTECTOMY;  Surgeon: Fanny Skates, MD;  Location: [facility];  Service: General;   Laterality: N/A;  . COLON SURGERY  03/2007   Resection of  cecum, appendix, terminal ileum (approximately/notes 10/10/2010  . CYSTOSCOPY/URETEROSCOPY/HOLMIUM LASER/STENT PLACEMENT Right 07/03/2020   Procedure: CYSTOSCOPY/RETROGRADE/URETEROSCOPY REMOVAL OF BLADDER STONE;  Surgeon: Raynelle Bring, MD;  Location: [facility];  Service: Urology;  Laterality: Right;  . HERNIA REPAIR    . LAPAROSCOPIC CHOLECYSTECTOMY  05/07/2016  . LAPAROSCOPIC LYSIS OF ADHESIONS  05/07/2016  . LAPAROSCOPIC LYSIS OF ADHESIONS N/A 05/07/2016   Procedure: LAPAROSCOPIC LYSIS OF ADHESIONS TIMES ONE HOUR;  Surgeon: Fanny Skates, MD;  Location: [facility];  Service: General;  Laterality: N/A;  . POSTERIOR FUSION THORACIC SPINE  02/08/2016   1. Posterior thoracic arthrodesis T7-T11 utilizing morcellized allograft, 2. Posterior thoracic segmental fixation T7-T11 utilizing nuvasive pedicle screws  . PROSTATECTOMY  06/2001   w/bilateral pelvic lymph nose dissection/notes 10/24/2010  . SPINAL FUSION  12/2014   Open reduction internal fixation of L1 Chance fracture with posterior fusion T10-L4 utilizing morcellized allograft and some local autograft, segmental instrumentation T10-L4 inclusive utilizing nuvasive pedicle screws/notes 12/16/2014  . Stress Cardiolite  02/17/2003  . THOROCOTOMY WITH LOBECTOMY  03/16/2015   Procedure: THOROCOTOMY WITH LOBECTOMY;  Surgeon: Ivin Poot, MD;  Location: [facility];  Service: Thoracic;;  . TONSILLECTOMY    . TOTAL THYROIDECTOMY  1997  . Venous Doppler  05/30/2004  . VENTRAL HERNIA REPAIR  04/14/2008  . VIDEO ASSISTED THORACOSCOPY Left 03/16/2015   Procedure: VIDEO ASSISTED THORACOSCOPY;  Surgeon: Ivin Poot, MD;  Location: [facility];  Service: Thoracic;  Laterality: Left;     Social History:   reports that he quit smoking about 30 years ago. His smoking use included cigarettes. He has a 60.00 pack-year smoking history. He has never used smokeless tobacco. He reports previous alcohol use. He reports  that he does not use drugs.   Family History:  His family history includes CAD in his mother. There is no history of Cancer.   Allergies No Known Allergies   Home Medications  Prior to Admission medications   Medication Sig Start Date End Date Taking? Authorizing Provider  albuterol (VENTOLIN HFA) 108 (90 Base) MCG/ACT inhaler Inhale 2 puffs into the lungs every 6 (six) hours as needed for wheezing or shortness of breath. 08/31/20  Yes Brand Males, MD  ALPRAZolam Duanne Moron) 0.5 MG tablet Take 0.5 mg by mouth 2 (two) times daily as needed for anxiety. 02/29/20  Yes [provider]  amoxicillin-clavulanate (AUGMENTIN) 875-125 MG tablet Take 1 tablet by mouth every 12 (twelve) hours. 09/08/20  Yes [provider]  apixaban (ELIQUIS) 5 MG TABS tablet Take 5 mg by mouth 2 (two) times daily.   Yes [provider]  atenolol (TENORMIN) 25 MG tablet Take 1 tablet (25 mg total) by mouth 2 (two) times daily. 02/09/20  Yes Thurnell Lose, MD  busPIRone (BUSPAR) 15 MG tablet Take 15 mg by mouth 2 (two) times daily.  05/17/16  Yes [provider]  calcium-vitamin D (OSCAL WITH D) 500-200 MG-UNIT tablet Take 2 tablets by mouth daily.    Yes [provider]  hydrochlorothiazide (HYDRODIURIL) 12.5 MG tablet Take 12.5 mg by mouth every morning. 06/06/20  Yes [provider]  HYDROcodone-acetaminophen (NORCO/VICODIN) 5-325 MG tablet Take 1 tablet by mouth every 6 (six) hours as needed (pain). 09/08/20  Yes [provider]  insulin lispro (HUMALOG KWIKPEN) 100 UNIT/ML KiwkPen INJECT UNDER THE SKIN THREE TIMES DAILY( 25 UNITS, 25 UNITS AND 20 UNITS RESPECTIVELY BEFORE MEALS) Patient taking differently: Inject 20-30 Units into the skin See admin instructions. Inject 25 units subcutaneously before  breakfast, 20 units before lunch and 30 units before supper 05/17/16  Yes Weber, Damaris Hippo, PA-C  levothyroxine (SYNTHROID) 125 MCG tablet Take 1 tablet (125 mcg  total) by mouth daily before breakfast. 02/10/20  Yes Thurnell Lose, MD  Mesalamine 800 MG TBEC Take 1,600 mg by mouth 2 (two) times daily.   Yes [provider]  methocarbamol (ROBAXIN) 500 MG tablet Take 500 mg by mouth every 8 (eight) hours. 09/08/20  Yes [provider]  Multiple Vitamins-Iron (DAILY-VITE/IRON/BETA-CAROTENE) TABS Take 1 tablet by mouth daily.    Yes [provider]  Tiotropium Bromide Monohydrate (SPIRIVA RESPIMAT) 2.5 MCG/ACT AERS Inhale 2 puffs into the lungs daily. 08/31/20  Yes Brand Males, MD  zinc gluconate 50 MG tablet Take 50 mg by mouth daily.   Yes [provider]  B-D ULTRAFINE III SHORT PEN 31G X 8 MM MISC USE 1 PEN NEEDLE FOUR TIMES DAILY. 09/18/16   Ivar Drape D, PA  diltiazem (CARDIZEM) 30 MG tablet Take 1 tablet every 4 hours AS NEEDED for heart rate >100 as long as blood pressure >100. Patient not taking: No sig reported 03/30/20   Fenton, Clint R, PA  Glucose Blood (ASCENSIA CONTOUR TEST VI) Use as directed to test two times a day    [provider]  glucose blood (BAYER CONTOUR TEST) test strip TEST BLOOD SUGAR TWICE DAILY AS DIRECTED. 05/17/16   Gale Journey, Sarah L, PA-C  glucose blood test strip 1 each by Other route 2 times daily. 05/17/16   [provider]  Insulin Pen Needle 31G X 8 MM MISC Inject 31 g into the skin. 09/18/16   [provider]  leuprolide (LUPRON) 11.25 MG injection Inject 11.25 mg into the muscle every 6 (six) months.     [provider]     Critical care time: 30 minutes    Domenick Bookbinder, MS4

## 2020-09-15 ENCOUNTER — Inpatient Hospital Stay (HOSPITAL_COMMUNITY): Payer: Medicare Other

## 2020-09-15 ENCOUNTER — Inpatient Hospital Stay (HOSPITAL_COMMUNITY): Payer: Medicare Other | Admitting: Anesthesiology

## 2020-09-15 ENCOUNTER — Encounter (HOSPITAL_COMMUNITY)
Admission: EM | Disposition: A | Discharge status: Rehabilitation Facility | Payer: Self-pay | Source: Home / Self Care | Attending: Internal Medicine

## 2020-09-15 DIAGNOSIS — S271XXA Traumatic hemothorax, initial encounter: Secondary | ICD-10-CM | POA: Diagnosis not present

## 2020-09-15 DIAGNOSIS — J9621 Acute and chronic respiratory failure with hypoxia: Secondary | ICD-10-CM | POA: Diagnosis not present

## 2020-09-15 DIAGNOSIS — J9622 Acute and chronic respiratory failure with hypercapnia: Secondary | ICD-10-CM | POA: Diagnosis not present

## 2020-09-15 DIAGNOSIS — J150 Pneumonia due to Klebsiella pneumoniae: Secondary | ICD-10-CM | POA: Diagnosis not present

## 2020-09-15 DIAGNOSIS — J9602 Acute respiratory failure with hypercapnia: Secondary | ICD-10-CM | POA: Diagnosis not present

## 2020-09-15 DIAGNOSIS — Z981 Arthrodesis status: Secondary | ICD-10-CM

## 2020-09-15 DIAGNOSIS — J9601 Acute respiratory failure with hypoxia: Secondary | ICD-10-CM | POA: Diagnosis not present

## 2020-09-15 HISTORY — PX: LAMINECTOMY WITH POSTERIOR LATERAL ARTHRODESIS LEVEL 1: SHX6335

## 2020-09-15 LAB — CBC WITH DIFFERENTIAL/PLATELET
Abs Immature Granulocytes: 0.09 10*3/uL — ABNORMAL HIGH (ref 0.00–0.07)
Basophils Absolute: 0 10*3/uL (ref 0.0–0.1)
Basophils Relative: 0 %
Eosinophils Absolute: 0 10*3/uL (ref 0.0–0.5)
Eosinophils Relative: 0 %
HCT: 34.6 % — ABNORMAL LOW (ref 39.0–52.0)
Hemoglobin: 11.3 g/dL — ABNORMAL LOW (ref 13.0–17.0)
Immature Granulocytes: 1 %
Lymphocytes Relative: 2 %
Lymphs Abs: 0.3 10*3/uL — ABNORMAL LOW (ref 0.7–4.0)
MCH: 30.5 pg (ref 26.0–34.0)
MCHC: 32.7 g/dL (ref 30.0–36.0)
MCV: 93.5 fL (ref 80.0–100.0)
Monocytes Absolute: 1.5 10*3/uL — ABNORMAL HIGH (ref 0.1–1.0)
Monocytes Relative: 9 %
Neutro Abs: 14.4 10*3/uL — ABNORMAL HIGH (ref 1.7–7.7)
Neutrophils Relative %: 88 %
Platelets: 277 10*3/uL (ref 150–400)
RBC: 3.7 MIL/uL — ABNORMAL LOW (ref 4.22–5.81)
RDW: 14.6 % (ref 11.5–15.5)
WBC: 16.4 10*3/uL — ABNORMAL HIGH (ref 4.0–10.5)
nRBC: 0 % (ref 0.0–0.2)

## 2020-09-15 LAB — PROTIME-INR
INR: 1.1 (ref 0.8–1.2)
Prothrombin Time: 13.3 seconds (ref 11.4–15.2)

## 2020-09-15 LAB — CULTURE, BLOOD (SINGLE)
Culture: NO GROWTH
Special Requests: ADEQUATE

## 2020-09-15 LAB — GLUCOSE, CAPILLARY
Glucose-Capillary: 254 mg/dL — ABNORMAL HIGH (ref 70–99)
Glucose-Capillary: 196 mg/dL — ABNORMAL HIGH (ref 70–99)
Glucose-Capillary: 155 mg/dL — ABNORMAL HIGH (ref 70–99)
Glucose-Capillary: 233 mg/dL — ABNORMAL HIGH (ref 70–99)
Glucose-Capillary: 242 mg/dL — ABNORMAL HIGH (ref 70–99)

## 2020-09-15 LAB — BASIC METABOLIC PANEL
Anion gap: 9 (ref 5–15)
BUN: 17 mg/dL (ref 8–23)
CO2: 27 mmol/L (ref 22–32)
Calcium: 8.5 mg/dL — ABNORMAL LOW (ref 8.9–10.3)
Chloride: 100 mmol/L (ref 98–111)
Creatinine, Ser: 1.11 mg/dL (ref 0.61–1.24)
GFR, Estimated: >60 mL/min (ref >60)
Glucose, Bld: 248 mg/dL — ABNORMAL HIGH (ref 70–99)
Potassium: 4.2 mmol/L (ref 3.5–5.1)
Sodium: 136 mmol/L (ref 135–145)

## 2020-09-15 LAB — SURGICAL PCR SCREEN
MRSA, PCR: NEGATIVE
Staphylococcus aureus: NEGATIVE

## 2020-09-15 SURGERY — LAMINECTOMY WITH POSTERIOR LATERAL ARTHRODESIS LEVEL 1
Anesthesia: General | Site: Back

## 2020-09-15 MED ORDER — ACETAMINOPHEN 500 MG PO TABS: 1000.0000 mg | ORAL_TABLET | Freq: Once | ORAL | Status: DC

## 2020-09-15 MED ORDER — ONDANSETRON HCL 4 MG PO TABS: 4.0000 mg | ORAL_TABLET | Freq: Four times a day (QID) | ORAL | Status: DC | PRN

## 2020-09-15 MED ORDER — MIDAZOLAM HCL 2 MG/2ML IJ SOLN
INTRAMUSCULAR | Status: AC
  Filled 2020-09-15: qty 2

## 2020-09-15 MED ORDER — LACTATED RINGERS IV SOLN: INTRAVENOUS | Status: DC | PRN

## 2020-09-15 MED ORDER — CHLORHEXIDINE GLUCONATE CLOTH 2 % EX PADS
6.0000 | MEDICATED_PAD | Freq: Once | CUTANEOUS | Status: AC
  Administered 2020-09-15: 6 via TOPICAL

## 2020-09-15 MED ORDER — DEXAMETHASONE SODIUM PHOSPHATE 10 MG/ML IJ SOLN
10.0000 mg | Freq: Once | INTRAMUSCULAR | Status: AC
  Administered 2020-09-15: 10 mg via INTRAVENOUS
  Filled 2020-09-15: qty 1

## 2020-09-15 MED ORDER — POTASSIUM CHLORIDE IN NACL 20-0.9 MEQ/L-% IV SOLN
INTRAVENOUS | Status: DC
  Filled 2020-09-15 (×2): qty 1000

## 2020-09-15 MED ORDER — PHENYLEPHRINE 40 MCG/ML (10ML) SYRINGE FOR IV PUSH (FOR BLOOD PRESSURE SUPPORT)
PREFILLED_SYRINGE | INTRAVENOUS | Status: DC | PRN
  Administered 2020-09-15 (×6): 120 ug via INTRAVENOUS
  Administered 2020-09-15: 80 ug via INTRAVENOUS

## 2020-09-15 MED ORDER — SENNA 8.6 MG PO TABS
1.0000 | ORAL_TABLET | Freq: Two times a day (BID) | ORAL | Status: DC
  Administered 2020-09-16: 8.6 mg via ORAL
  Filled 2020-09-15: qty 1

## 2020-09-15 MED ORDER — FENTANYL CITRATE (PF) 100 MCG/2ML IJ SOLN: 25.0000 ug | INTRAMUSCULAR | Status: DC | PRN

## 2020-09-15 MED ORDER — ONDANSETRON HCL 4 MG/2ML IJ SOLN
INTRAMUSCULAR | Status: DC | PRN
  Administered 2020-09-15: 4 mg via INTRAVENOUS

## 2020-09-15 MED ORDER — BUPIVACAINE HCL (PF) 0.25 % IJ SOLN
INTRAMUSCULAR | Status: AC
  Filled 2020-09-15: qty 30

## 2020-09-15 MED ORDER — BACITRACIN ZINC 500 UNIT/GM EX OINT
TOPICAL_OINTMENT | CUTANEOUS | Status: AC
  Filled 2020-09-15: qty 28.35

## 2020-09-15 MED ORDER — ACETAMINOPHEN 500 MG PO TABS: 1000.0000 mg | ORAL_TABLET | Freq: Once | ORAL | Status: DC | PRN

## 2020-09-15 MED ORDER — ACETAMINOPHEN 500 MG PO TABS: 1000.0000 mg | ORAL_TABLET | ORAL | Status: DC

## 2020-09-15 MED ORDER — CEFAZOLIN SODIUM-DEXTROSE 2-4 GM/100ML-% IV SOLN
2.0000 g | INTRAVENOUS | Status: AC
  Administered 2020-09-15: 2 g via INTRAVENOUS

## 2020-09-15 MED ORDER — BUPIVACAINE HCL (PF) 0.25 % IJ SOLN
INTRAMUSCULAR | Status: DC | PRN
  Administered 2020-09-15: 5 mL

## 2020-09-15 MED ORDER — ACETAMINOPHEN 650 MG RE SUPP: 650.0000 mg | RECTAL | Status: DC | PRN

## 2020-09-15 MED ORDER — OXYCODONE HCL 5 MG PO TABS: 5.0000 mg | ORAL_TABLET | Freq: Once | ORAL | Status: DC | PRN

## 2020-09-15 MED ORDER — CEFAZOLIN SODIUM-DEXTROSE 2-4 GM/100ML-% IV SOLN
2.0000 g | Freq: Three times a day (TID) | INTRAVENOUS | Status: AC
  Administered 2020-09-15 – 2020-09-16 (×2): 2 g via INTRAVENOUS
  Filled 2020-09-15 (×2): qty 100

## 2020-09-15 MED ORDER — OXYCODONE HCL 5 MG/5ML PO SOLN: 5.0000 mg | Freq: Once | ORAL | Status: DC | PRN

## 2020-09-15 MED ORDER — ONDANSETRON HCL 4 MG/2ML IJ SOLN: 4.0000 mg | Freq: Once | INTRAMUSCULAR | Status: DC | PRN

## 2020-09-15 MED ORDER — ACETAMINOPHEN 160 MG/5ML PO SOLN: 1000.0000 mg | Freq: Once | ORAL | Status: DC | PRN

## 2020-09-15 MED ORDER — SODIUM CHLORIDE 0.9% FLUSH
3.0000 mL | Freq: Two times a day (BID) | INTRAVENOUS | Status: DC
  Administered 2020-09-16 – 2020-09-18 (×3): 3 mL via INTRAVENOUS

## 2020-09-15 MED ORDER — CEFAZOLIN SODIUM 1 G IJ SOLR
INTRAMUSCULAR | Status: AC
  Filled 2020-09-15: qty 20

## 2020-09-15 MED ORDER — ROCURONIUM BROMIDE 10 MG/ML (PF) SYRINGE
PREFILLED_SYRINGE | INTRAVENOUS | Status: AC
  Filled 2020-09-15: qty 10

## 2020-09-15 MED ORDER — SUGAMMADEX SODIUM 200 MG/2ML IV SOLN
INTRAVENOUS | Status: DC | PRN
  Administered 2020-09-15: 200 mg via INTRAVENOUS

## 2020-09-15 MED ORDER — GABAPENTIN 300 MG PO CAPS: 300.0000 mg | ORAL_CAPSULE | ORAL | Status: DC

## 2020-09-15 MED ORDER — ROCURONIUM BROMIDE 10 MG/ML (PF) SYRINGE
PREFILLED_SYRINGE | INTRAVENOUS | Status: DC | PRN
  Administered 2020-09-15 (×2): 50 mg via INTRAVENOUS

## 2020-09-15 MED ORDER — DEXAMETHASONE SODIUM PHOSPHATE 10 MG/ML IJ SOLN
INTRAMUSCULAR | Status: DC | PRN
  Administered 2020-09-15: 4 mg via INTRAVENOUS

## 2020-09-15 MED ORDER — VANCOMYCIN HCL 1000 MG IV SOLR
INTRAVENOUS | Status: AC
  Filled 2020-09-15: qty 1000

## 2020-09-15 MED ORDER — ACETAMINOPHEN 325 MG PO TABS
650.0000 mg | ORAL_TABLET | ORAL | Status: DC | PRN
  Administered 2020-09-26: 650 mg via ORAL
  Filled 2020-09-15: qty 2

## 2020-09-15 MED ORDER — PHENYLEPHRINE 40 MCG/ML (10ML) SYRINGE FOR IV PUSH (FOR BLOOD PRESSURE SUPPORT)
PREFILLED_SYRINGE | INTRAVENOUS | Status: AC
  Filled 2020-09-15: qty 10

## 2020-09-15 MED ORDER — 0.9 % SODIUM CHLORIDE (POUR BTL) OPTIME
TOPICAL | Status: DC | PRN
  Administered 2020-09-15: 1000 mL

## 2020-09-15 MED ORDER — THROMBIN 5000 UNITS EX SOLR: OROMUCOSAL | Status: DC | PRN

## 2020-09-15 MED ORDER — FENTANYL CITRATE (PF) 250 MCG/5ML IJ SOLN
INTRAMUSCULAR | Status: DC | PRN
  Administered 2020-09-15 (×2): 50 ug via INTRAVENOUS
  Administered 2020-09-15: 100 ug via INTRAVENOUS
  Administered 2020-09-15: 50 ug via INTRAVENOUS

## 2020-09-15 MED ORDER — SODIUM CHLORIDE 0.9 % IV SOLN: 250.0000 mL | INTRAVENOUS | Status: DC

## 2020-09-15 MED ORDER — SODIUM CHLORIDE 0.9% FLUSH
3.0000 mL | INTRAVENOUS | Status: DC | PRN
  Administered 2020-09-17: 3 mL via INTRAVENOUS

## 2020-09-15 MED ORDER — VANCOMYCIN HCL 1000 MG IV SOLR
INTRAVENOUS | Status: DC | PRN
  Administered 2020-09-15: 1000 mg via TOPICAL

## 2020-09-15 MED ORDER — ACETAMINOPHEN 10 MG/ML IV SOLN: 1000.0000 mg | Freq: Once | INTRAVENOUS | Status: DC | PRN

## 2020-09-15 MED ORDER — ARTIFICIAL TEARS OPHTHALMIC OINT
TOPICAL_OINTMENT | OPHTHALMIC | Status: AC
  Filled 2020-09-15: qty 3.5

## 2020-09-15 MED ORDER — ONDANSETRON HCL 4 MG/2ML IJ SOLN: 4.0000 mg | Freq: Four times a day (QID) | INTRAMUSCULAR | Status: DC | PRN

## 2020-09-15 MED ORDER — MIDAZOLAM HCL 2 MG/2ML IJ SOLN
INTRAMUSCULAR | Status: DC | PRN
  Administered 2020-09-15: 1 mg via INTRAVENOUS

## 2020-09-15 MED ORDER — PHENYLEPHRINE HCL-NACL 10-0.9 MG/250ML-% IV SOLN
INTRAVENOUS | Status: DC | PRN
  Administered 2020-09-15: 50 ug/min via INTRAVENOUS

## 2020-09-15 MED ORDER — THROMBIN 5000 UNITS EX SOLR
CUTANEOUS | Status: AC
  Filled 2020-09-15: qty 5000

## 2020-09-15 MED ORDER — LACTATED RINGERS IV BOLUS
1000.0000 mL | Freq: Once | INTRAVENOUS | Status: AC
  Administered 2020-09-15: 1000 mL via INTRAVENOUS

## 2020-09-15 MED ORDER — PROPOFOL 10 MG/ML IV BOLUS
INTRAVENOUS | Status: DC | PRN
  Administered 2020-09-15: 30 mg via INTRAVENOUS

## 2020-09-15 MED ORDER — FENTANYL CITRATE (PF) 250 MCG/5ML IJ SOLN
INTRAMUSCULAR | Status: AC
  Filled 2020-09-15: qty 5

## 2020-09-15 MED ORDER — SODIUM CHLORIDE (PF) 0.9 % IJ SOLN
INTRAMUSCULAR | Status: AC
  Filled 2020-09-15: qty 10

## 2020-09-15 MED ORDER — THROMBIN 20000 UNITS EX SOLR
CUTANEOUS | Status: AC
  Filled 2020-09-15: qty 20000

## 2020-09-15 SURGICAL SUPPLY — 54 items
APL SKNCLS STERI-STRIP NONHPOA (GAUZE/BANDAGES/DRESSINGS) ×1
BASKET BONE COLLECTION (BASKET) IMPLANT
BENZOIN TINCTURE PRP APPL 2/3 (GAUZE/BANDAGES/DRESSINGS) ×2 IMPLANT
BLADE CLIPPER SURG (BLADE) IMPLANT
BUR CARBIDE MATCH 3.0 (BURR) ×2 IMPLANT
CANISTER SUCT 3000ML PPV (MISCELLANEOUS) ×2 IMPLANT
CNTNR URN SCR LID CUP LEK RST (MISCELLANEOUS) ×1 IMPLANT
CONN OPEN OPEN REV 11 5.0/6.35 (Connector) ×16 IMPLANT
CONNECTOR OPN REV11 5.0/6.35 (Connector) ×8 IMPLANT
CONT SPEC 4OZ STRL OR WHT (MISCELLANEOUS) ×2
COVER BACK TABLE 60X90IN (DRAPES) ×2 IMPLANT
COVER WAND RF STERILE (DRAPES) ×2 IMPLANT
DIFFUSER DRILL AIR PNEUMATIC (MISCELLANEOUS) ×2 IMPLANT
DRAPE C-ARM 42X72 X-RAY (DRAPES) IMPLANT
DRAPE LAPAROTOMY 100X72X124 (DRAPES) ×2 IMPLANT
DRAPE SURG 17X23 STRL (DRAPES) ×2 IMPLANT
DRSG EMULSION OIL 3X3 NADH (GAUZE/BANDAGES/DRESSINGS) ×2 IMPLANT
DRSG OPSITE POSTOP 4X10 (GAUZE/BANDAGES/DRESSINGS) ×2 IMPLANT
DRSG TELFA 3X8 NADH (GAUZE/BANDAGES/DRESSINGS) ×2 IMPLANT
DURAPREP 26ML APPLICATOR (WOUND CARE) ×2 IMPLANT
ELECT REM PT RETURN 9FT ADLT (ELECTROSURGICAL) ×2
ELECTRODE REM PT RTRN 9FT ADLT (ELECTROSURGICAL) ×1 IMPLANT
EVACUATOR 1/8 PVC DRAIN (DRAIN) IMPLANT
GAUZE 4X4 16PLY RFD (DISPOSABLE) IMPLANT
GLOVE BIO SURGEON STRL SZ7 (GLOVE) IMPLANT
GLOVE BIO SURGEON STRL SZ8 (GLOVE) ×4 IMPLANT
GLOVE SURG UNDER POLY LF SZ7 (GLOVE) IMPLANT
GOWN STRL REUS W/ TWL LRG LVL3 (GOWN DISPOSABLE) IMPLANT
GOWN STRL REUS W/ TWL XL LVL3 (GOWN DISPOSABLE) ×2 IMPLANT
GOWN STRL REUS W/TWL 2XL LVL3 (GOWN DISPOSABLE) IMPLANT
GOWN STRL REUS W/TWL LRG LVL3 (GOWN DISPOSABLE)
GOWN STRL REUS W/TWL XL LVL3 (GOWN DISPOSABLE) ×4
GRAFT TRIN ELITE MED MUSC TRAN (Graft) ×2 IMPLANT
HEMOSTAT POWDER KIT SURGIFOAM (HEMOSTASIS) IMPLANT
KIT BASIN OR (CUSTOM PROCEDURE TRAY) ×2 IMPLANT
KIT TURNOVER KIT B (KITS) ×2 IMPLANT
NEEDLE HYPO 25X1 1.5 SAFETY (NEEDLE) ×2 IMPLANT
NS IRRIG 1000ML POUR BTL (IV SOLUTION) ×2 IMPLANT
PACK LAMINECTOMY NEURO (CUSTOM PROCEDURE TRAY) ×2 IMPLANT
PAD ARMBOARD 7.5X6 YLW CONV (MISCELLANEOUS) ×6 IMPLANT
ROD KODIAK 5.5X300 (Rod) ×2 IMPLANT
SET SCREW (Screw) ×16 IMPLANT
SET SCREW SPNE (Screw) ×8 IMPLANT
SPONGE LAP 4X18 RFD (DISPOSABLE) IMPLANT
SPONGE SURGIFOAM ABS GEL 100 (HEMOSTASIS) ×2 IMPLANT
STRIP CLOSURE SKIN 1/2X4 (GAUZE/BANDAGES/DRESSINGS) ×6 IMPLANT
SUT VIC AB 0 CT1 18XCR BRD8 (SUTURE) ×1 IMPLANT
SUT VIC AB 0 CT1 8-18 (SUTURE) ×2
SUT VIC AB 2-0 CP2 18 (SUTURE) ×2 IMPLANT
SUT VIC AB 3-0 SH 8-18 (SUTURE) ×4 IMPLANT
TOWEL GREEN STERILE (TOWEL DISPOSABLE) ×2 IMPLANT
TOWEL GREEN STERILE FF (TOWEL DISPOSABLE) ×2 IMPLANT
TRAY FOLEY MTR SLVR 16FR STAT (SET/KITS/TRAYS/PACK) IMPLANT
WATER STERILE IRR 1000ML POUR (IV SOLUTION) ×2 IMPLANT

## 2020-09-15 NOTE — Progress Notes (Signed)
I am told by CCM that this pt is stable for ORIF T11 fracture. Plan is for surgery today, using side connectors to link the two rod/screw systems and fusion across T11. Hep held. Npo. Awaiting his wife's arrival to discuss details with her

## 2020-09-15 NOTE — Op Note (Signed)
09/15/2020  5:31 PM  PATIENT:  Devin [name]  80 y.o. male  PRE-OPERATIVE DIAGNOSIS:  Unstable T11-12 chance fracture  POST-OPERATIVE DIAGNOSIS:  same  PROCEDURE:  Posterior thoracic fusion T11-12 with morselized allograft, segmental fixation  T9-L1 utilizing alpha tech Devin connectors  SURGEON:  Sherley Bounds, MD  ASSISTANTS:  Glenford Peers FNP  ANESTHESIA:   General  EBL: 50 ml  Total I/O In: 800 [I.V.:800] Out: 500 [Urine:450; Blood:50]  BLOOD ADMINISTERED: none  DRAINS:  none  SPECIMEN:  none  INDICATION FOR PROCEDURE: This patient presented with  And unstable T11 Chance fracture after a fall.  He fracture between 2 separate pedicle screw and Devin constructs. Imaging showed the unstable Chance fracture. The patient tried conservative measures without relief. Pain was debilitating. Recommended  T9-L1 fixation and fusion. Patient understood the risks, benefits, and alternatives and potential outcomes and wished to proceed.  PROCEDURE DETAILS:  The patient is taken to the OR for groomed after induction of adequate generalized endotracheal anesthesia he was rolled into the prone position on chest rolls and all pressure points were padded.  His thoracic and lumbar region was cleaned with Hibiclens and then cleaned with Betadine scrub and DuraPrep and then draped in the usual sterile fashion.  6 cc of local anesthesia was injected and a dorsal midline incision was made it care down to the spinous processes.  The paraspinous musculature was taken down as subperiosteal fashion to expose the hardware from T9-L1.  We removed soft tissue from underneath the Devin between the T9, T10, T11, and T12 [place] heads.  We then placed Devin to Devin connectors between T9-10, T10-11, and 2 below the T12 pedicle screws.  We locked these into position with the anti torque device.  We then cut 2 150 mm rods, bent them into a lordotic curvature and placed them into the Devin connectors.  We then locked them into  position with locking caps and anti torque device.  We then exposed the lamina of T12 and T11 on the left and drilled this.  We irrigated with copious amounts of saline solution.  We then placed morselized allograft over the lamina at T11-12 on the left.  We then checked our construct with AP fluoroscopy.  We then closed the fascia with interrupted 0 Vicryl.  We closed the subcutaneous tissue with 2-0 Vicryl and the subcuticular tissue with 3-0 Vicryl.  The skin was closed with benzoin and Steri-Strips.  A sterile dressing was applied.  The drape for removed.  The patient was then transported to the ICU intubated in stable condition.   PLAN OF CARE: Admit to inpatient   PATIENT DISPOSITION:  PACU - hemodynamically stable.   Delay start of Pharmacological VTE agent (>24hrs) due to surgical blood loss or risk of bleeding:  yes

## 2020-09-15 NOTE — Anesthesia Preprocedure Evaluation (Addendum)
Anesthesia Evaluation  Patient identified by MRN, date of birth, ID band Patient unresponsive    Reviewed: Allergy & Precautions, Patient's Chart, lab work & pertinent test results, reviewed documented beta blocker date and time , Unable to perform ROS - Chart review only  History of Anesthesia Complications Negative for: history of anesthetic complications  Airway Mallampati: Intubated       Dental   Pulmonary shortness of breath and with exertion, COPD,  COPD inhaler, former smoker,  Hx lung ca Former smoker, quit 1991, 60 pack year history    breath sounds clear to auscultation       Cardiovascular hypertension, Pt. on medications and Pt. on home beta blockers + Past MI  CHF: grade 1 diastolic dysfunction.  + dysrhythmias (eliquis) Atrial Fibrillation + Valvular Problems/Murmurs (mild MR) MR  Rhythm:Regular  Echo 12/2019: 1. Left ventricular ejection fraction, by estimation, is 60 to 65%. The  left ventricle has normal function. The left ventricle has no regional  wall motion abnormalities. There is mild left ventricular hypertrophy of  the posterior segment. Left  ventricular diastolic parameters are consistent with Grade I diastolic  dysfunction (impaired relaxation).  2. Right ventricular systolic function is normal. The right ventricular  size is normal. There is normal pulmonary artery systolic pressure.  3. The mitral valve is normal in structure. Mild mitral valve  regurgitation. No evidence of mitral stenosis.  4. The aortic valve is tricuspid. Aortic valve regurgitation is not  visualized. No aortic stenosis is present.  5. The inferior vena cava is normal in size with greater than 50%  respiratory variability, suggesting right atrial pressure of 3 mmHg.    Neuro/Psych negative neurological ROS  negative psych ROS   GI/Hepatic Neg liver ROS, crohns    Endo/Other  diabetes, Well Controlled, Type 2, Insulin  DependentHypothyroidism Last a1c 6.4  Renal/GU Renal InsufficiencyRenal diseaseCr 1.11  negative genitourinary   Musculoskeletal  (+) Arthritis , Osteoarthritis,  Ankylosing spondylitis    Abdominal   Peds  Hematology  (+) Blood dyscrasia, anemia , 11.3/34.6   Anesthesia Other Findings   Reproductive/Obstetrics negative OB ROS                            Anesthesia Physical Anesthesia Plan  ASA: III  Anesthesia Plan: General   Post-op Pain Management:    Induction: Intravenous  PONV Risk Score and Plan: 2 and Ondansetron, Dexamethasone and Treatment may vary due to age or medical condition  Airway Management Planned: Oral ETT  Additional Equipment: None  Intra-op Plan:   Post-operative Plan: Post-operative intubation/ventilation  Informed Consent:     Dental advisory given  Plan Discussed with: CRNA and Surgeon  Anesthesia Plan Comments:        Anesthesia Quick Evaluation

## 2020-09-15 NOTE — Progress Notes (Signed)
Inpatient Diabetes Program Recommendations  AACE/ADA: New Consensus Statement on Inpatient Glycemic Control (2015)  Target Ranges:  Prepandial:   less than 140 mg/dL      Peak postprandial:   less than 180 mg/dL (1-2 hours)      Critically ill patients:  140 - 180 mg/dL   Lab Results  Component Value Date   GLUCAP 196 (H) 09/15/2020   HGBA1C 6.4 (H) 09/10/2020    Review of Glycemic Control Results for SHAFER, SWAMY" (MRN [id number]) as of 09/15/2020 09:46  Ref. Range 09/14/2020 07:00 09/14/2020 11:41 09/14/2020 15:15 09/14/2020 19:13 09/14/2020 23:06 09/15/2020 03:18 09/15/2020 07:24  Glucose-Capillary Latest Ref Range: 70 - 99 mg/dL 236 (H) 217 (H) 258 (H) 228 (H) 212 (H) 254 (H) 196 (H)   Diabetes history: DM 2 Outpatient Diabetes medications:  Humalog 75/25 - 25 units q AM, Humalog 75/25- 20 units with lunch and Humalog 75/25- 30 units with supper Current orders for Inpatient glycemic control:  Lantus 5 units bid Novolog sensitive q 4 hours Vital 65 ml/hr  Inpatient Diabetes Program Recommendations:   Please consider increasing Lantus to 8-10 units bid.  Also please add Novolog 3 units q 4 hours to cover tube feeds.   Thanks,  Tama Headings RN, MSN, BC-ADM Inpatient Diabetes Coordinator Team Pager [phone] (8a-5p)

## 2020-09-15 NOTE — Transfer of Care (Signed)
Immediate Anesthesia Transfer of Care Note  Patient: Devin [name]  Procedure(s) Performed: REVISION OF THORACOLUMBAR FUSION, ADDITION OF CROSS-CONNECTORS (N/A Back)  Patient Location: ICU  Anesthesia Type:General  Level of Consciousness: Patient remains intubated per anesthesia plan  Airway & Oxygen Therapy: Patient remains intubated per anesthesia plan and Patient placed on Ventilator (see vital sign flow sheet for setting)  Post-op Assessment: Report given to RN and Post -op Vital signs reviewed and stable  Post vital signs: Reviewed and stable  Last Vitals:  Vitals Value Taken Time  BP 98/67 09/15/20 1705  Temp    Pulse 78 09/15/20 1706  Resp 19 09/15/20 1706  SpO2 97 % 09/15/20 1706  Vitals shown include unvalidated device data.  Last Pain:  Vitals:   09/15/20 1200  TempSrc: Axillary  PainSc: 0-No pain         Complications: No complications documented.

## 2020-09-15 NOTE — Progress Notes (Signed)
NAME:  [name], MRN:  [id number], DOB:  1941-04-03, LOS: 5 ADMISSION DATE:  09/10/2020, CONSULTATION DATE:  09/12/20 REFERRING MD:  [facility], CHIEF COMPLAINT:  T11-12 Chance Fracture c/b acute on chronic hypercapnic RF   History of Present Illness:  Devin [name] is a 80 y.o. male here for medical stabilization prior to pursuing spine stabilization neurosurgery (connecting prior T7-11 and T11-L4 rods) following a fall with a relevant PMHx of restrictive lung disease, emphysema/COPD, adenocarcinoma s/p LUL lobectomy (2016), ankylosing spondylitis, Crohn's, Afib, nephrolithiasis, HTN, DM, obesity, and thyroid and prostate cancer (see below). Per chart review, patient fell at a bookstore 3/31. New onset back pain - denied head trauma or LOC at that time. Saw PCP on 4/1 and underwent thoracic, lumbar, and chest x-ray. Found to have new T11 Chance fracture.He also has a stable L1 compression fracture, extensive lumbar spondylosis and postsurgical changes. Additionally had a left seventh rib fracture anteriorly and a left pleural effusion without any obvious pneumothorax. Patient was intubated in the ER on 4/3 for agitation and hypoxemia. Was transferred to [facility] for cardiopulmonary stabilization and eventual neurosurgical intervention. Neurosurgery intervention planned for today, 09/15/20.   Pertinent  Medical History  Restrictive lung disease (likely 2/2 prior opioid use for pain management and posture), emphysema, COPD, adenocarcinoma s/p LUL lobectomy (2016), Ankylosing spondylitis, Crohn's, Psoriasis, Afib, nephrolithiasis, HTN, DM, obesity, thyroid cancer s/p RAI and total thyroidectomy (1999), and prostate cancer s/p prostatectomy (2012) but with recurrence.      Significant Hospital Events: Including procedures, antibiotic start and stop dates in addition to other pertinent events    4/3 - Intubated in ER for agitation   4/3 - Resp viral panel PCR Neg  4/3 - Respiratory culture from  ET sent  4/3 - Blood Cx, negative at 48 hrs   4/6 - Min Vent requirements, HDS  4/8 - Neurosurgery plans to link prior rod systems across T11 Fx   Interim History / Subjective:  NAEON. Patient denies pain this morning. More awake and responsive since d/c continuous sedation.    Objective   Blood pressure (!) 153/64, pulse 96, temperature 99.3 F (37.4 C), temperature source Oral, resp. rate 17, height 5' 9.02" (1.753 m), weight 92.5 kg, SpO2 95 %.    Vent Mode: PRVC FiO2 (%):  [30 %] 30 % Set Rate:  [16 bmp] 16 bmp Vt Set:  [560 mL] 560 mL PEEP:  [5 cmH20] 5 cmH20 Plateau Pressure:  [21 cmH20-25 cmH20] 24 cmH20   Intake/Output Summary (Last 24 hours) at 09/15/2020 1135 Last data filed at 09/15/2020 0500 Gross per 24 hour  Intake 1166.24 ml  Output 1225 ml  Net -58.76 ml   Filed Weights   09/12/20 0154 09/14/20 0500 09/15/20 0400  Weight: 88 kg 88 kg 92.5 kg    Examination: General: Chronically ill appearing, skin appears fragile with multiple bruises, abrasions HENT: MMM, ETT in place  Lungs: Rhoncorous breath sounds in the anterior bilateral fields Cardiovascular: RRR, S1/S2, no m/r/g, no peripheral or trace edema  Abdomen: firm, NTND Extremities: multiple abrasions and fragile skin per above Neuro: Lethargic, 3/5 strength in the UE and LE, follows commands appropriately bilaterally, PERRL, extraocular movement appears to be mostly intact but not formally assessed given patient participation  GU: deferred  Labs/imaging that I havepersonally reviewed  (right click and "Reselect all SmartList Selections" daily)  BMP CBC  Resolved Hospital Problem list   N/A  Assessment & Plan:  Devin [name] is a  80 y.o. male here for medical stabilization prior to pursuing spine stabilization neurosurgery (connecting prior T7-11 and T11-L4 rods) following a fall with a relevant PMHx of restrictive lung disease (likely 2/2 prior opioid use for pain management and posture),  emphysema/COPD, adenocarcinoma s/p LUL lobectomy (2016), Ankylosing spondylitis, Crohn's, Psoriasis, Afib, nephrolithiasis, HTN, DM, obesity, thyroid cancer s/p RAI and total thyroidectomy (1999), and prostate cancer s/p prostatectomy (2012) but with recurrence. Patient is HDS and is able to breath spontaneously with minimal ventilator pressure support c/b intermittent episodes of apnea. Neurosurgery pursuing intervention for ORIF today. Goal to wean vent/extubate following procedure per anesth. Will optimize pain control following operation.    T11 Chance Fracture - Per neurosurgery - Operation planned today   Acute on Chronic Hypercapnic Respiratory Failure Pt maintained on minimal respiration support able to spontaneously breathing with pressure support, but has intermittent apnea - will resume breaths when prompted. Apnea likely 2/2 decreased resp effort given back pain. Neurosurgery intervention today should help stabilize pain.  - Optimize pain management and wean vent post-op  - Continue PRVC until surgery, then per anesth for extubation   Type 1 Diabetes - Continue w/ Lantus and SSI  - NPO since midnight  - Regulate insulin regimen plan post-op   - May need to increase basal lantus and add tube feed coverage   Leukocytosis Per chart review, patient had a leukocytosis on 4/1 and was started on augmentin. Has had a waxing and waning WBC (13 > 13.8 > 11.6 > 12 > 16.4). Leukocytosis likely 2/2 to atelectasis from decreased resp effort versus infectious process.   - Afebrile but is receiving tylenol 534m q6hr  - Blood Cx negative at 4 days - RespPP negative  - CXR today with no focal processes c/w aspiration PNA. Bibasilar atelectasis and likely small pleura effusions are noted  - CTM    Best practice (right click and "Reselect all SmartList Selections" daily)  Diet:  NPO Pain/Anxiety/Delirium protocol (if indicated): Yes (RASS goal 0) VAP protocol (if indicated): Yes DVT  prophylaxis: SCD GI prophylaxis: PPI Glucose control:  SSI Yes Central venous access:  N/A Arterial line:  N/A Foley:  Yes, and it is still needed Mobility:  OOB  PT consulted: N/A Last date of multidisciplinary goals of care discussion [TBD] Code Status:  full code Disposition: ICU  Labs   CBC: Recent Labs  Lab 09/10/20 1031 09/11/20 0227 09/12/20 0240 09/13/20 0225 09/14/20 0026 09/15/20 0059  WBC 14.1* 13.0* 13.8* 11.6* 12.0* 16.4*  NEUTROABS 12.3*  --   --   --   --  14.4*  HGB 12.8* 12.7* 11.1* 10.7* 11.3* 11.3*  HCT 41.8 39.9 34.2* 32.7* 34.2* 34.6*  MCV 96.8 95.9 94.5 93.2 92.9 93.5  PLT 251 220 225 198 222 2258   Basic Metabolic Panel: Recent Labs  Lab 09/11/20 0227 09/11/20 1337 09/11/20 1739 09/12/20 0240 09/12/20 1701 09/13/20 0225 09/14/20 0026 09/15/20 0059  NA 134*  --   --  131*  --  131* 132* 136  K 3.5  --   --  3.0*  --  2.6* 4.4 4.2  CL 93*  --   --  94*  --  94* 100 100  CO2 30  --   --  27  --  27 27 27   GLUCOSE 89  --   --  119*  --  162* 198* 248*  BUN 18  --   --  28*  --  26* 21 17  CREATININE 1.23  --   --  1.66*  --  1.44* 1.25* 1.11  CALCIUM 8.3*  --   --  7.3*  --  7.6* 7.9* 8.5*  MG  --  1.4* 1.9 1.7 1.9  --   --   --   PHOS  --  <1.0* 1.1* 2.3* 2.8  --   --   --    GFR: Estimated Creatinine Clearance: 62.6 mL/min (by C-G formula based on SCr of 1.11 mg/dL). Recent Labs  Lab 09/10/20 1043 09/10/20 1533 09/11/20 0227 09/12/20 0240 09/13/20 0225 09/14/20 0026 09/15/20 0059  WBC  --   --    < > 13.8* 11.6* 12.0* 16.4*  LATICACIDVEN 1.0 2.7*  --   --   --   --   --    < > = values in this interval not displayed.    Liver Function Tests: Recent Labs  Lab 09/10/20 1031  AST 24  ALT 21  ALKPHOS 66  BILITOT 0.8  PROT 6.7  ALBUMIN 3.6   Recent Labs  Lab 09/10/20 1031  LIPASE 21   No results for input(s): AMMONIA in the last 168 hours.  ABG    Component Value Date/Time   PHART 7.570 (H) 09/10/2020 1358    PCO2ART 35.2 09/10/2020 1358   PO2ART 148 (H) 09/10/2020 1358   HCO3 32.3 (H) 09/10/2020 1358   TCO2 23.0 03/17/2015 0540   ACIDBASEDEF 3.6 (H) 03/17/2015 0540   O2SAT 98.6 09/10/2020 1358     Coagulation Profile: Recent Labs  Lab 09/15/20 0059  INR 1.1    Cardiac Enzymes: No results for input(s): CKTOTAL, CKMB, CKMBINDEX, TROPONINI in the last 168 hours.  HbA1C: Hgb A1c MFr Bld  Date/Time Value Ref Range Status  09/10/2020 03:33 PM 6.4 (H) 4.8 - 5.6 % Final    Comment:    (NOTE) Pre diabetes:          5.7%-6.4%  Diabetes:              >6.4%  Glycemic control for   <7.0% adults with diabetes   07/03/2020 02:46 PM 6.2 (H) 4.8 - 5.6 % Final    Comment:    (NOTE) Pre diabetes:          5.7%-6.4%  Diabetes:              >6.4%  Glycemic control for   <7.0% adults with diabetes     CBG: Recent Labs  Lab 09/14/20 1913 09/14/20 2306 09/15/20 0318 09/15/20 0724 09/15/20 1130  GLUCAP 228* 212* 254* 196* 155*    Review of Systems:     Past Medical History:  He,  has a past medical history of A-fib ([place]), Adenocarcinoma of left lung, stage 1 ([place]) (03/10/2015), Ankylosing spondylitis ([place]), Crohn's disease ([place]), Gout, HIstory of basal cell cancer of face, History of kidney stones, Hypertension, Hypothyroidism, Impotence, 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   Surgical History:   Past Surgical History:  Procedure Laterality Date  . ABDOMINAL EXPLORATION SURGERY     for small bowel obstruction  . APPENDECTOMY  03/2007  . BACK SURGERY    . BASAL CELL CARCINOMA EXCISION  "several"   "head"  . CARDIAC CATHETERIZATION  03/17/2003  . CHOLECYSTECTOMY N/A 05/07/2016   Procedure: LAPAROSCOPIC CHOLECYSTECTOMY;  Surgeon: Fanny Skates, MD;  Location: [facility];  Service: General;  Laterality: N/A;  .  COLON SURGERY  03/2007   Resection of cecum, appendix, terminal ileum (approximately/notes 10/10/2010  . CYSTOSCOPY/URETEROSCOPY/HOLMIUM LASER/STENT PLACEMENT Right 07/03/2020   Procedure: CYSTOSCOPY/RETROGRADE/URETEROSCOPY REMOVAL OF BLADDER STONE;  Surgeon: Raynelle Bring, MD;  Location: [facility];  Service: Urology;  Laterality: Right;  . HERNIA REPAIR    . LAPAROSCOPIC CHOLECYSTECTOMY  05/07/2016  . LAPAROSCOPIC LYSIS OF ADHESIONS  05/07/2016  . LAPAROSCOPIC LYSIS OF ADHESIONS N/A 05/07/2016   Procedure: LAPAROSCOPIC LYSIS OF ADHESIONS TIMES ONE HOUR;  Surgeon: Fanny Skates, MD;  Location: [facility];  Service: General;  Laterality: N/A;  . POSTERIOR FUSION THORACIC SPINE  02/08/2016   1. Posterior thoracic arthrodesis T7-T11 utilizing morcellized allograft, 2. Posterior thoracic segmental fixation T7-T11 utilizing nuvasive pedicle screws  . PROSTATECTOMY  06/2001   w/bilateral pelvic lymph nose dissection/notes 10/24/2010  . SPINAL FUSION  12/2014   Open reduction internal fixation of L1 Chance fracture with posterior fusion T10-L4 utilizing morcellized allograft and some local autograft, segmental instrumentation T10-L4 inclusive utilizing nuvasive pedicle screws/notes 12/16/2014  . Stress Cardiolite  02/17/2003  . THOROCOTOMY WITH LOBECTOMY  03/16/2015   Procedure: THOROCOTOMY WITH LOBECTOMY;  Surgeon: Ivin Poot, MD;  Location: [facility];  Service: Thoracic;;  . TONSILLECTOMY    . TOTAL THYROIDECTOMY  1997  . Venous Doppler  05/30/2004  . VENTRAL HERNIA REPAIR  04/14/2008  . VIDEO ASSISTED THORACOSCOPY Left 03/16/2015   Procedure: VIDEO ASSISTED THORACOSCOPY;  Surgeon: Ivin Poot, MD;  Location: [facility];  Service: Thoracic;  Laterality: Left;     Social History:   reports that he quit smoking about 30 years ago. His smoking use included cigarettes. He has a 60.00 pack-year smoking history. He has never used smokeless tobacco. He reports previous alcohol use. He reports that he does not use  drugs.   Family History:  His family history includes CAD in his mother. There is no history of Cancer.   Allergies No Known Allergies   Home Medications  Prior to Admission medications   Medication Sig Start Date End Date Taking? Authorizing Provider  albuterol (VENTOLIN HFA) 108 (90 Base) MCG/ACT inhaler Inhale 2 puffs into the lungs every 6 (six) hours as needed for wheezing or shortness of breath. 08/31/20  Yes Brand Males, MD  ALPRAZolam Duanne Moron) 0.5 MG tablet Take 0.5 mg by mouth 2 (two) times daily as needed for anxiety. 02/29/20  Yes [provider]  amoxicillin-clavulanate (AUGMENTIN) 875-125 MG tablet Take 1 tablet by mouth every 12 (twelve) hours. 09/08/20  Yes [provider]  apixaban (ELIQUIS) 5 MG TABS tablet Take 5 mg by mouth 2 (two) times daily.   Yes [provider]  atenolol (TENORMIN) 25 MG tablet Take 1 tablet (25 mg total) by mouth 2 (two) times daily. 02/09/20  Yes Thurnell Lose, MD  busPIRone (BUSPAR) 15 MG tablet Take 15 mg by mouth 2 (two) times daily.  05/17/16  Yes [provider]  calcium-vitamin D (OSCAL WITH D) 500-200 MG-UNIT tablet Take 2 tablets by mouth daily.    Yes [provider]  hydrochlorothiazide (HYDRODIURIL) 12.5 MG tablet Take 12.5 mg by mouth every morning. 06/06/20  Yes [provider]  HYDROcodone-acetaminophen (NORCO/VICODIN) 5-325 MG tablet Take 1 tablet by mouth every 6 (six) hours as needed (pain). 09/08/20  Yes [provider]  insulin lispro (HUMALOG KWIKPEN) 100 UNIT/ML KiwkPen INJECT UNDER THE SKIN THREE TIMES DAILY( 25 UNITS, 25 UNITS AND 20 UNITS RESPECTIVELY BEFORE MEALS) Patient taking differently: Inject 20-30 Units into the skin  See admin instructions. Inject 25 units subcutaneously before breakfast, 20 units before lunch and 30 units before supper 05/17/16  Yes Weber, Damaris Hippo, PA-C  levothyroxine (SYNTHROID) 125 MCG tablet Take 1 tablet (125 mcg total) by mouth daily  before breakfast. 02/10/20  Yes Thurnell Lose, MD  Mesalamine 800 MG TBEC Take 1,600 mg by mouth 2 (two) times daily.   Yes [provider]  methocarbamol (ROBAXIN) 500 MG tablet Take 500 mg by mouth every 8 (eight) hours. 09/08/20  Yes [provider]  Multiple Vitamins-Iron (DAILY-VITE/IRON/BETA-CAROTENE) TABS Take 1 tablet by mouth daily.    Yes [provider]  Tiotropium Bromide Monohydrate (SPIRIVA RESPIMAT) 2.5 MCG/ACT AERS Inhale 2 puffs into the lungs daily. 08/31/20  Yes Brand Males, MD  zinc gluconate 50 MG tablet Take 50 mg by mouth daily.   Yes [provider]  B-D ULTRAFINE III SHORT PEN 31G X 8 MM MISC USE 1 PEN NEEDLE FOUR TIMES DAILY. 09/18/16   Ivar Drape D, PA  diltiazem (CARDIZEM) 30 MG tablet Take 1 tablet every 4 hours AS NEEDED for heart rate >100 as long as blood pressure >100. Patient not taking: No sig reported 03/30/20   Fenton, Clint R, PA  Glucose Blood (ASCENSIA CONTOUR TEST VI) Use as directed to test two times a day    [provider]  glucose blood (BAYER CONTOUR TEST) test strip TEST BLOOD SUGAR TWICE DAILY AS DIRECTED. 05/17/16   Gale Journey, Sarah L, PA-C  glucose blood test strip 1 each by Other route 2 times daily. 05/17/16   [provider]  Insulin Pen Needle 31G X 8 MM MISC Inject 31 g into the skin. 09/18/16   [provider]  leuprolide (LUPRON) 11.25 MG injection Inject 11.25 mg into the muscle every 6 (six) months.     [provider]     Critical care time: 30 minutes    Domenick Bookbinder, MS4

## 2020-09-15 NOTE — Anesthesia Procedure Notes (Signed)
Central Venous Catheter Insertion Performed by: Oleta Mouse, MD, anesthesiologist Start/End4/01/2021 2:41 PM, 09/15/2020 2:47 PM Patient location: OR. Preanesthetic checklist: patient identified, IV checked, site marked, risks and benefits discussed, surgical consent, monitors and equipment checked, pre-op evaluation, timeout performed and anesthesia consent Lidocaine 1% used for infiltration and patient sedated Hand hygiene performed  and maximum sterile barriers used  Catheter size: 8 Fr Total catheter length 16. Central line was placed.Double lumen Procedure performed using ultrasound guided technique. Ultrasound Notes:anatomy identified, needle tip was noted to be adjacent to the nerve/plexus identified, no ultrasound evidence of intravascular and/or intraneural injection and image(s) printed for medical record Attempts: 1 Following insertion, dressing applied, line sutured and Biopatch. Post procedure assessment: blood return through all ports, free fluid flow and no air  Patient tolerated the procedure well with no immediate complications.

## 2020-09-16 ENCOUNTER — Inpatient Hospital Stay (HOSPITAL_COMMUNITY): Payer: Medicare Other

## 2020-09-16 DIAGNOSIS — J9601 Acute respiratory failure with hypoxia: Secondary | ICD-10-CM | POA: Diagnosis not present

## 2020-09-16 DIAGNOSIS — J9602 Acute respiratory failure with hypercapnia: Secondary | ICD-10-CM | POA: Diagnosis not present

## 2020-09-16 LAB — BASIC METABOLIC PANEL
Anion gap: 7 (ref 5–15)
BUN: 18 mg/dL (ref 8–23)
CO2: 27 mmol/L (ref 22–32)
Calcium: 8.3 mg/dL — ABNORMAL LOW (ref 8.9–10.3)
Chloride: 101 mmol/L (ref 98–111)
Creatinine, Ser: 1.22 mg/dL (ref 0.61–1.24)
GFR, Estimated: >60 mL/min (ref >60)
Glucose, Bld: 236 mg/dL — ABNORMAL HIGH (ref 70–99)
Potassium: 4.8 mmol/L (ref 3.5–5.1)
Sodium: 135 mmol/L (ref 135–145)

## 2020-09-16 LAB — CBC
HCT: 31.9 % — ABNORMAL LOW (ref 39.0–52.0)
Hemoglobin: 10.2 g/dL — ABNORMAL LOW (ref 13.0–17.0)
MCH: 30.4 pg (ref 26.0–34.0)
MCHC: 32 g/dL (ref 30.0–36.0)
MCV: 94.9 fL (ref 80.0–100.0)
Platelets: 277 10*3/uL (ref 150–400)
RBC: 3.36 MIL/uL — ABNORMAL LOW (ref 4.22–5.81)
RDW: 14.8 % (ref 11.5–15.5)
WBC: 17.3 10*3/uL — ABNORMAL HIGH (ref 4.0–10.5)
nRBC: 0 % (ref 0.0–0.2)

## 2020-09-16 LAB — GLUCOSE, CAPILLARY
Glucose-Capillary: 245 mg/dL — ABNORMAL HIGH (ref 70–99)
Glucose-Capillary: 212 mg/dL — ABNORMAL HIGH (ref 70–99)
Glucose-Capillary: 196 mg/dL — ABNORMAL HIGH (ref 70–99)
Glucose-Capillary: 202 mg/dL — ABNORMAL HIGH (ref 70–99)
Glucose-Capillary: 211 mg/dL — ABNORMAL HIGH (ref 70–99)
Glucose-Capillary: 297 mg/dL — ABNORMAL HIGH (ref 70–99)

## 2020-09-16 MED ORDER — FENTANYL CITRATE (PF) 100 MCG/2ML IJ SOLN
INTRAMUSCULAR | Status: AC
  Administered 2020-09-16: 100 ug via INTRAVENOUS
  Filled 2020-09-16: qty 2

## 2020-09-16 MED ORDER — FENTANYL CITRATE (PF) 100 MCG/2ML IJ SOLN: 100.0000 ug | Freq: Once | INTRAMUSCULAR | Status: AC

## 2020-09-16 MED ORDER — ETOMIDATE 2 MG/ML IV SOLN
INTRAVENOUS | Status: AC
  Filled 2020-09-16: qty 20

## 2020-09-16 MED ORDER — MIDAZOLAM HCL 2 MG/2ML IJ SOLN
INTRAMUSCULAR | Status: AC
  Administered 2020-09-16: 2 mg via INTRAVENOUS
  Filled 2020-09-16: qty 2

## 2020-09-16 MED ORDER — ROCURONIUM BROMIDE 10 MG/ML (PF) SYRINGE
PREFILLED_SYRINGE | INTRAVENOUS | Status: AC
  Administered 2020-09-16: 40 mg via INTRAVENOUS
  Filled 2020-09-16: qty 10

## 2020-09-16 MED ORDER — NOREPINEPHRINE 4 MG/250ML-% IV SOLN
INTRAVENOUS | Status: AC
  Filled 2020-09-16: qty 250

## 2020-09-16 MED ORDER — INSULIN ASPART 100 UNIT/ML ~~LOC~~ SOLN
0.0000 [IU] | SUBCUTANEOUS | Status: DC
  Administered 2020-09-16: 3 [IU] via SUBCUTANEOUS
  Administered 2020-09-16: 5 [IU] via SUBCUTANEOUS
  Administered 2020-09-16: 8 [IU] via SUBCUTANEOUS
  Administered 2020-09-16 (×3): 5 [IU] via SUBCUTANEOUS
  Administered 2020-09-17: 11 [IU] via SUBCUTANEOUS
  Administered 2020-09-17: 5 [IU] via SUBCUTANEOUS

## 2020-09-16 MED ORDER — MIDAZOLAM HCL 2 MG/2ML IJ SOLN: 2.0000 mg | Freq: Once | INTRAMUSCULAR | Status: AC

## 2020-09-16 MED ORDER — LORAZEPAM 2 MG/ML IJ SOLN
1.0000 mg | Freq: Once | INTRAMUSCULAR | Status: AC
  Administered 2020-09-16: 1 mg via INTRAVENOUS

## 2020-09-16 MED ORDER — LACTATED RINGERS IV SOLN: INTRAVENOUS | Status: DC

## 2020-09-16 MED ORDER — KETAMINE HCL 50 MG/5ML IJ SOSY
PREFILLED_SYRINGE | INTRAMUSCULAR | Status: AC
  Filled 2020-09-16: qty 5

## 2020-09-16 MED ORDER — SODIUM CHLORIDE 0.9 % IV SOLN: Freq: Once | INTRAVENOUS | Status: AC

## 2020-09-16 MED ORDER — SODIUM CHLORIDE 0.9 % IV SOLN: INTRAVENOUS | Status: DC

## 2020-09-16 MED ORDER — ROCURONIUM BROMIDE 10 MG/ML (PF) SYRINGE: 40.0000 mg | PREFILLED_SYRINGE | Freq: Once | INTRAVENOUS | Status: AC

## 2020-09-16 MED ORDER — INSULIN GLARGINE 100 UNIT/ML ~~LOC~~ SOLN
8.0000 [IU] | Freq: Two times a day (BID) | SUBCUTANEOUS | Status: DC
  Administered 2020-09-16 – 2020-09-19 (×7): 8 [IU] via SUBCUTANEOUS
  Filled 2020-09-16 (×11): qty 0.08

## 2020-09-16 MED ORDER — NOREPINEPHRINE 4 MG/250ML-% IV SOLN
0.0000 ug/min | INTRAVENOUS | Status: DC
  Administered 2020-09-16: 2 ug/min via INTRAVENOUS

## 2020-09-16 MED ORDER — METOPROLOL TARTRATE 5 MG/5ML IV SOLN
5.0000 mg | Freq: Once | INTRAVENOUS | Status: AC
  Administered 2020-09-16: 5 mg via INTRAVENOUS

## 2020-09-16 MED ORDER — LORAZEPAM 2 MG/ML IJ SOLN
INTRAMUSCULAR | Status: AC
  Filled 2020-09-16: qty 1

## 2020-09-16 MED ORDER — METOPROLOL TARTRATE 5 MG/5ML IV SOLN
INTRAVENOUS | Status: AC
  Filled 2020-09-16: qty 5

## 2020-09-16 MED ORDER — DEXAMETHASONE SODIUM PHOSPHATE 10 MG/ML IJ SOLN
8.0000 mg | Freq: Four times a day (QID) | INTRAMUSCULAR | Status: AC
  Administered 2020-09-16 – 2020-09-17 (×4): 8 mg via INTRAVENOUS
  Filled 2020-09-16 (×4): qty 1

## 2020-09-16 NOTE — Procedures (Signed)
Extubation Procedure Note  Patient Details:   Name: Devin [name] DOB: 11-Mar-1941 MRN: [id number]   Airway Documentation:    Vent end date: 09/16/20 Vent end time: 0845   Evaluation  O2 sats: stable throughout Complications: Complications of HR 888 weak cough Patient did tolerate procedure well. Bilateral Breath Sounds: Diminished  PT able to speak: Yes  Rudene Re 09/16/2020, 8:58 AM

## 2020-09-16 NOTE — Progress Notes (Signed)
   09/16/20 0800  Airway 7.5 mm  Placement Date/Time: 09/10/20 1240   Placed By: ED Physician  Airway Device: Endotracheal Tube  Laryngoscope Blade: 4  ETT Types: Oral  Size (mm): 7.5 mm  Cuffed: Cuffed  Insertion attempts: 1  Airway Equipment: Stylet;Video Laryngoscope  Placement Co...  Secured at (cm) 27 cm  Measured From Lips  Secured Location Left  Secured By Actuary Repositioned Yes  Prone position No  Cuff Pressure (cm H2O) Green OR 18-26 CmH2O  Site Condition Other (Comment) (lip swollen)  Adult Ventilator Settings  Vent Type Servo i  Humidity Heated wire  Vent Mode PSV;CPAP  FiO2 (%) 30 %  I Time 1 Sec(s)  Pressure Support 8 cmH20  PEEP 5 cmH20  Adult Ventilator Measurements  Peak Airway Pressure 13 L/min  Mean Airway Pressure 7 cmH20  Resp Rate Spontaneous 16 br/min  Resp Rate Total 16 br/min  Spont TV 315 mL  Measured Ve 5.1 mL  Auto PEEP 0 cmH20  Total PEEP 5 cmH20  SpO2 97 %  Adult Ventilator Alarms  Alarms On Y  Ve High Alarm 26 L/min  Ve Low Alarm 4 L/min  Resp Rate High Alarm 38 br/min  Resp Rate Low Alarm 10  PEEP Low Alarm 3 cmH2O  Press High Alarm 40 cmH2O  T Apnea 20 sec(s)  VAP Prevention  HME changed No  Ventilator changed No  Transported while on vent No  HOB> 30 Degrees Y  Equipment wiped down Yes  Daily Weaning Assessment  Daily Assessment of Readiness to Wean Wean protocol criteria met (SBT performed) (PT on PS when RT entered room.)  SBT Method CPAP 5 cm H20 and PS 5 cm H20 (PS 8)  Weaning Start Time  (Unknown start time)  Patient response Passed (Tolerated well)  Breath Sounds  Bilateral Breath Sounds Diminished  Airway Suctioning/Secretions  Suction Type ETT  Suction Device  Catheter  Secretion Amount Small  Secretion Color White  Secretion Consistency Thick  Suction Tolerance Tolerated well  Suctioning Adverse Effects None    Pt on PS 8 when RT entered room. Unknown wean time.

## 2020-09-16 NOTE — Progress Notes (Signed)
Subjective: Patient extubated this morning, but just received Ativan for restlessness and is somnolent.  Per nursing, prior to Ativan patient reported back pain improved compared with preop.  Objective: Vital signs in last 24 hours: Temp:  [98.4 F (36.9 C)-99.8 F (37.7 C)] 98.5 F (36.9 C) (04/09 0800) Pulse Rate:  [76-110] 105 (04/09 0800) Resp:  [12-23] 21 (04/09 0800) BP: (85-169)/(48-110) 169/80 (04/09 0800) SpO2:  [95 %-100 %] 95 % (04/09 0859) FiO2 (%):  [30 %] 30 % (04/09 0800) Weight:  [94.1 kg] 94.1 kg (04/09 0400)  Intake/Output from previous day: 04/08 0701 - 04/09 0700 In: 2223.1 [I.V.:1671.5; IV Piggyback:551.6] Out: 850 [Urine:800; Blood:50] Intake/Output this shift: Total I/O In: 221.3 [I.V.:221.3] Out: -   Eyes closed, somnolent Breathing is regular, SaO2 93% Cough weak FC x4 with good strength per nursing prior to Ativan Dressing c/d  Lab Results: Recent Labs    09/15/20 0059 09/16/20 0253  WBC 16.4* 17.3*  HGB 11.3* 10.2*  HCT 34.6* 31.9*  PLT 277 277   BMET Recent Labs    09/15/20 0059 09/16/20 0253  NA 136 135  K 4.2 4.8  CL 100 101  CO2 27 27  GLUCOSE 248* 236*  BUN 17 18  CREATININE 1.11 1.22  CALCIUM 8.5* 8.3*    Studies/Results: DG Thoracolumabar Spine  Result Date: 09/15/2020 CLINICAL DATA:  Elective surgery. T9 through L1 posterior instrumentation. EXAM: THORACOLUMBAR SPINE 1V COMPARISON:  Recent chest radiograph. FINDINGS: Single frontal fluoroscopic spot view of the lower thoracic and upper lumbar spine demonstrates fusion hardware, not entirely included in the field of view. Levels are difficult to delineate on this single view. IMPRESSION: Single frontal fluoroscopic spot view of the lower thoracic and upper lumbar spine demonstrates spinal fusion hardware. Please reference operative report for details. Electronically Signed   By: Keith Rake M.D.   On: 09/15/2020 17:25   DG CHEST PORT 1 VIEW  Result Date:  09/15/2020 CLINICAL DATA:  Endotracheal tube. EXAM: PORTABLE CHEST 1 VIEW COMPARISON:  September 11, 2020. FINDINGS: The heart size and mediastinal contours are within normal limits. No pneumothorax is noted. Endotracheal and nasogastric tubes are in good position. Bibasilar atelectasis is noted with probable small pleural effusions. Status post surgical posterior fusion of lower thoracic spine. IMPRESSION: Endotracheal and nasogastric tubes are in good position. Bibasilar subsegmental atelectasis with probable small pleural effusions. Electronically Signed   By: Marijo Conception M.D.   On: 09/15/2020 08:24   DG C-Arm 1-60 Min  Result Date: 09/15/2020 CLINICAL DATA:  Elective surgery. T9 through L1 posterior instrumentation. EXAM: THORACOLUMBAR SPINE 1V COMPARISON:  Recent chest radiograph. FINDINGS: Single frontal fluoroscopic spot view of the lower thoracic and upper lumbar spine demonstrates fusion hardware, not entirely included in the field of view. Levels are difficult to delineate on this single view. IMPRESSION: Single frontal fluoroscopic spot view of the lower thoracic and upper lumbar spine demonstrates spinal fusion hardware. Please reference operative report for details. Electronically Signed   By: Keith Rake M.D.   On: 09/15/2020 17:25    Assessment/Plan: S/p rod connection for T11 fracture - pulmonary toilet - cont supportive care - when respiratory status improves, begin PT   Vallarie Mare 09/16/2020, 10:32 AM

## 2020-09-16 NOTE — Progress Notes (Addendum)
NAME:  Devin [name], MRN:  [id number], DOB:  January 21, 1941, LOS: 6 ADMISSION DATE:  09/10/2020, CONSULTATION DATE:  09/12/20 REFERRING MD:  Triad, CHIEF COMPLAINT:  T11-12 Chance Fracture c/b acute on chronic hypercapnic RF   History of Present Illness:  Devin [name] is a 80 y.o. male here for medical stabilization prior to pursuing spine stabilization neurosurgery (connecting prior T7-11 and T11-L4 rods) following a fall with a relevant PMHx of restrictive lung disease, emphysema/COPD, adenocarcinoma s/p LUL lobectomy (2016), ankylosing spondylitis, Crohn's, Afib, nephrolithiasis, HTN, DM, obesity, and thyroid and prostate cancer (see below). Per chart review, patient fell at a bookstore 3/31. New onset back pain - denied head trauma or LOC at that time. Saw PCP on 4/1 and underwent thoracic, lumbar, and chest x-ray. Found to have new T11 Chance fracture.He also has a stable L1 compression fracture, extensive lumbar spondylosis and postsurgical changes. Additionally had a left seventh rib fracture anteriorly and a left pleural effusion without any obvious pneumothorax. Patient was intubated in the ER on 4/3 for agitation and hypoxemia. Was transferred to [facility] for cardiopulmonary stabilization and eventual neurosurgical intervention. Neurosurgery intervention planned for today, 09/15/20.   Pertinent  Medical History  Restrictive lung disease (likely 2/2 prior opioid use for pain management and posture), emphysema, COPD, adenocarcinoma s/p LUL lobectomy (2016), Ankylosing spondylitis, Crohn's, Psoriasis, Afib, nephrolithiasis, HTN, DM, obesity, thyroid cancer s/p RAI and total thyroidectomy (1999), and prostate cancer s/p prostatectomy (2012) but with recurrence.      Significant Hospital Events: Including procedures, antibiotic start and stop dates in addition to other pertinent events    4/3 - Intubated in ER for agitation   4/3 - Resp viral panel PCR Neg  4/3 - Respiratory culture from  ET sent  4/3 - Blood Cx, negative at 48 hrs   4/6 - Min Vent requirements, HDS  4/8 - Neurosurgery plans to link prior rod systems across T11 Fx  4/9- failed attempt extubation  Interim History / Subjective:   Awake this morning following commands.  Drawing acceptable tidal volumes on deep inspiration.  Tolerated 1-hour SBT.  Objective   Blood pressure (!) 107/55, pulse 67, temperature 98.2 F (36.8 C), temperature source Axillary, resp. rate 16, height 5' 9.02" (1.753 m), weight 94.1 kg, SpO2 99 %.    Vent Mode: PRVC FiO2 (%):  [30 %] 30 % Set Rate:  [16 bmp] 16 bmp Vt Set:  [560 mL] 560 mL PEEP:  [5 cmH20] 5 cmH20 Pressure Support:  [8 cmH20] 8 cmH20 Plateau Pressure:  [18 cmH20-21 cmH20] 21 cmH20   Intake/Output Summary (Last 24 hours) at 09/16/2020 1800 Last data filed at 09/16/2020 1500 Gross per 24 hour  Intake 3061.94 ml  Output 350 ml  Net 2711.94 ml   Filed Weights   09/14/20 0500 09/15/20 0400 09/16/20 0400  Weight: 88 kg 92.5 kg 94.1 kg    Examination: General: Chronically ill appearing, skin appears fragile with multiple bruises, abrasions HENT: MMM, ETT in place  Lungs: Chest is clear to auscultation bilaterally.  Minimal secretions. Cardiovascular: RRR, S1/S2, no m/r/g, no peripheral or trace edema  Abdomen: firm, NTND Extremities: multiple abrasions and fragile skin per above Neuro: Lethargic, 3/5 strength in the UE and LE, follows commands appropriately bilaterally, PERRL, extraocular movement appears to be mostly intact but not formally assessed given patient participation  GU: deferred  Labs/imaging that I havepersonally reviewed  (right click and "Reselect all SmartList Selections" daily)  BMP CBC  Resolved Hospital Problem  list   N/A  Assessment & Plan:   Critically ill due to acute hypoxic hypercarbic respiratory failure requiring mechanical ventilation. Chronic respiratory failure due to COPD combination restrictive lung disease due to  kyphoscoliosis and ankylosing spondylitis. Status post stabilization Chance fracture T11-T12 Generalized frailty with recent 40 pound weight loss over the last 6 to 12 months. Recent lung cancer resection. 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 -Short course of steroids for possible glottic edema. -Reattempt SBT and extubation in 48 hours. -Explained to patient's wife that patient is showing signs of significant frailty and likely had more significant respiratory disease and initially appreciated.  She appears surprised at his degree of frailty as he was apparently quite functional at home. A palliative care consult may eventually be appropriate.  Best practice (right click and "Reselect all SmartList Selections" daily)  Diet:  NPO restart tube feeding Pain/Anxiety/Delirium protocol (if indicated): Yes (RASS goal 0) VAP protocol (if indicated): Yes DVT prophylaxis: SCD GI prophylaxis: PPI Glucose control:  SSI Yes Central venous access:  N/A Arterial line:  N/A Foley:  Yes, and it is still needed Mobility:  OOB  PT consulted: N/A Last date of multidisciplinary goals of care discussion: Patient's wife was updated today/9. Code Status:  full code Disposition: ICU  Labs   CBC: Recent Labs  Lab 09/10/20 1031 09/11/20 0227 09/12/20 0240 09/13/20 0225 09/14/20 0026 09/15/20 0059 09/16/20 0253  WBC 14.1*   < > 13.8* 11.6* 12.0* 16.4* 17.3*  NEUTROABS 12.3*  --   --   --   --  14.4*  --   HGB 12.8*   < > 11.1* 10.7* 11.3* 11.3* 10.2*  HCT 41.8   < > 34.2* 32.7* 34.2* 34.6* 31.9*  MCV 96.8   < > 94.5 93.2 92.9 93.5 94.9  PLT 251   < > 225 198 222 277 277   < > = values in this interval not displayed.    Basic Metabolic Panel: Recent Labs  Lab 09/11/20 1337 09/11/20 1739 09/12/20 0240 09/12/20 1701 09/13/20 0225 09/14/20 0026 09/15/20 0059 09/16/20 0253  NA  --   --  131*  --  131* 132* 136 135  K  --   --   3.0*  --  2.6* 4.4 4.2 4.8  CL  --   --  94*  --  94* 100 100 101  CO2  --   --  27  --  27 27 27 27   GLUCOSE  --   --  119*  --  162* 198* 248* 236*  BUN  --   --  28*  --  26* 21 17 18   CREATININE  --   --  1.66*  --  1.44* 1.25* 1.11 1.22  CALCIUM  --   --  7.3*  --  7.6* 7.9* 8.5* 8.3*  MG 1.4* 1.9 1.7 1.9  --   --   --   --   PHOS <1.0* 1.1* 2.3* 2.8  --   --   --   --    GFR: Estimated Creatinine Clearance: 57.4 mL/min (by C-G formula based on SCr of 1.22 mg/dL). Recent Labs  Lab 09/10/20 1043 09/10/20 1533 09/11/20 0227 09/13/20 0225 09/14/20 0026 09/15/20 0059 09/16/20 0253  WBC  --   --    < > 11.6* 12.0* 16.4* 17.3*  LATICACIDVEN 1.0 2.7*  --   --   --   --   --    < > =  values in this interval not displayed.    Liver Function Tests: Recent Labs  Lab 09/10/20 1031  AST 24  ALT 21  ALKPHOS 66  BILITOT 0.8  PROT 6.7  ALBUMIN 3.6   Recent Labs  Lab 09/10/20 1031  LIPASE 21   No results for input(s): AMMONIA in the last 168 hours.  ABG    Component Value Date/Time   PHART 7.570 (H) 09/10/2020 1358   PCO2ART 35.2 09/10/2020 1358   PO2ART 148 (H) 09/10/2020 1358   HCO3 32.3 (H) 09/10/2020 1358   TCO2 23.0 03/17/2015 0540   ACIDBASEDEF 3.6 (H) 03/17/2015 0540   O2SAT 98.6 09/10/2020 1358     Coagulation Profile: Recent Labs  Lab 09/15/20 0059  INR 1.1    Cardiac Enzymes: No results for input(s): CKTOTAL, CKMB, CKMBINDEX, TROPONINI in the last 168 hours.  HbA1C: Hgb A1c MFr Bld  Date/Time Value Ref Range Status  09/10/2020 03:33 PM 6.4 (H) 4.8 - 5.6 % Final    Comment:    (NOTE) Pre diabetes:          5.7%-6.4%  Diabetes:              >6.4%  Glycemic control for   <7.0% adults with diabetes   07/03/2020 02:46 PM 6.2 (H) 4.8 - 5.6 % Final    Comment:    (NOTE) Pre diabetes:          5.7%-6.4%  Diabetes:              >6.4%  Glycemic control for   <7.0% adults with diabetes     CBG: Recent Labs  Lab 09/15/20 2320  09/16/20 0309 09/16/20 0733 09/16/20 1115 09/16/20 1538  GLUCAP 242* 245* 212* 196* 202*   CRITICAL CARE Performed by: Kipp Brood   Total critical care time: 40 minutes  Critical care time was exclusive of separately billable procedures and treating other patients.  Critical care was necessary to treat or prevent imminent or life-threatening deterioration.  Critical care was time spent personally by me on the following activities: development of treatment plan with patient and/or surrogate as well as nursing, discussions with consultants, evaluation of patient's response to treatment, examination of patient, obtaining history from patient or surrogate, ordering and performing treatments and interventions, ordering and review of laboratory studies, ordering and review of radiographic studies, pulse oximetry, re-evaluation of patient's condition and participation in multidisciplinary rounds.  Kipp Brood, MD [facility] ICU Physician [place]  Pager: [phone] Mobile: [phone] After hours: [phone].

## 2020-09-16 NOTE — Progress Notes (Signed)
PT Cancellation Note  Patient Details Name: Devin [name] MRN: [id number] DOB: 1940-08-07   Cancelled Treatment:    Reason Eval/Treat Not Completed: Medical issues which prohibited therapy at this time, pt extubated this morning, but required re-intubation this afternoon. PT will continue to follow and evaluate as appropriate.   Karma Ganja, PT, DPT   Acute Rehabilitation Department Pager #: [phone]   Otho Bellows 09/16/2020, 5:09 PM

## 2020-09-16 NOTE — Anesthesia Postprocedure Evaluation (Signed)
Anesthesia Post Note  Patient: Devin [name]  Procedure(s) Performed: REVISION OF THORACOLUMBAR FUSION, ADDITION OF CROSS-CONNECTORS (N/A Back)     Patient location during evaluation: SICU Anesthesia Type: General Level of consciousness: sedated Pain management: pain level controlled Vital Signs Assessment: post-procedure vital signs reviewed and stable Respiratory status: patient remains intubated per anesthesia plan Cardiovascular status: stable Postop Assessment: no apparent nausea or vomiting Anesthetic complications: no   No complications documented.  Last Vitals:  Vitals:   09/16/20 1118 09/16/20 1200  BP:    Pulse:    Resp:    Temp:  36.8 C  SpO2: 96%     Last Pain:  Vitals:   09/16/20 1200  TempSrc: Axillary  PainSc:                  Alexander Mcauley

## 2020-09-16 NOTE — Procedures (Signed)
Intubation Procedure Note  GURTEJ NOYOLA  [id number]  [birth year]  Date:09/16/20  Time:6:07 PM   Provider Performing:Kerly Rigsbee    Procedure: Intubation (31500)  Indication(s) Respiratory Failure  Consent Risks of the procedure as well as the alternatives and risks of each were explained to the patient and/or caregiver.  Consent for the procedure was obtained and is signed in the bedside chart   Anesthesia Versed, Fentanyl and Rocuronium   Time Out Verified patient identification, verified procedure, site/side was marked, verified correct patient position, special equipment/implants available, medications/allergies/relevant history reviewed, required imaging and test results available.   Sterile Technique Usual hand hygeine, masks, and gloves were used   Procedure Description Patient positioned in bed supine.  Sedation given as noted above.  Patient was intubated with endotracheal tube using Glidescope.  View was Grade 1 full glottis .  Number of attempts was 3.  Colorimetric CO2 detector was consistent with tracheal placement.  DIFFICULT AIRWAY DUE TO SUBGLOTTIC NARROWING.  Required placement of 7 French endotracheal tube over bougie..  Complications/Tolerance None; patient tolerated the procedure well.  Post intubation hypotension treated with fluids and vasopressors. Chest X-ray is ordered to verify placement.  Kipp Brood, MD [facility] ICU Physician [place]  Pager: [phone] Or Epic Secure Chat After hours: [phone].  09/16/2020, 6:09 PM

## 2020-09-16 NOTE — Progress Notes (Signed)
eLink Physician-Brief Progress Note Patient Name: ZEDDIE NJIE DOB: [birth year] MRN: [id number]   Date of Service  09/16/2020  HPI/Events of Note  Patient has been on sensitive ISS + glargine 5u BID. CBGs in 200s.   eICU Interventions  Increased glargine to 8u BID and increased ISS to moderate intensity.     Intervention Category Intermediate Interventions: Hyperglycemia - evaluation and treatment  Charlott Rakes 09/16/2020, 3:45 AM

## 2020-09-17 DIAGNOSIS — J9602 Acute respiratory failure with hypercapnia: Secondary | ICD-10-CM | POA: Diagnosis not present

## 2020-09-17 DIAGNOSIS — J9601 Acute respiratory failure with hypoxia: Secondary | ICD-10-CM | POA: Diagnosis not present

## 2020-09-17 LAB — CBC
HCT: 31.9 % — ABNORMAL LOW (ref 39.0–52.0)
Hemoglobin: 10 g/dL — ABNORMAL LOW (ref 13.0–17.0)
MCH: 31.1 pg (ref 26.0–34.0)
MCHC: 31.3 g/dL (ref 30.0–36.0)
MCV: 99.1 fL (ref 80.0–100.0)
Platelets: 299 10*3/uL (ref 150–400)
RBC: 3.22 MIL/uL — ABNORMAL LOW (ref 4.22–5.81)
RDW: 14.7 % (ref 11.5–15.5)
WBC: 14.6 10*3/uL — ABNORMAL HIGH (ref 4.0–10.5)
nRBC: 0 % (ref 0.0–0.2)

## 2020-09-17 LAB — BASIC METABOLIC PANEL
Anion gap: 9 (ref 5–15)
BUN: 30 mg/dL — ABNORMAL HIGH (ref 8–23)
CO2: 27 mmol/L (ref 22–32)
Calcium: 8.5 mg/dL — ABNORMAL LOW (ref 8.9–10.3)
Chloride: 102 mmol/L (ref 98–111)
Creatinine, Ser: 1.44 mg/dL — ABNORMAL HIGH (ref 0.61–1.24)
GFR, Estimated: 50 mL/min — ABNORMAL LOW (ref >60)
Glucose, Bld: 267 mg/dL — ABNORMAL HIGH (ref 70–99)
Potassium: 4.5 mmol/L (ref 3.5–5.1)
Sodium: 138 mmol/L (ref 135–145)

## 2020-09-17 LAB — GLUCOSE, CAPILLARY
Glucose-Capillary: 303 mg/dL — ABNORMAL HIGH (ref 70–99)
Glucose-Capillary: 231 mg/dL — ABNORMAL HIGH (ref 70–99)
Glucose-Capillary: 265 mg/dL — ABNORMAL HIGH (ref 70–99)
Glucose-Capillary: 220 mg/dL — ABNORMAL HIGH (ref 70–99)
Glucose-Capillary: 213 mg/dL — ABNORMAL HIGH (ref 70–99)
Glucose-Capillary: 167 mg/dL — ABNORMAL HIGH (ref 70–99)

## 2020-09-17 MED ORDER — ALPRAZOLAM 0.5 MG PO TABS
0.5000 mg | ORAL_TABLET | Freq: Two times a day (BID) | ORAL | Status: DC | PRN
  Administered 2020-09-17 – 2020-10-03 (×11): 0.5 mg
  Filled 2020-09-17 (×11): qty 1

## 2020-09-17 MED ORDER — NUTRISOURCE FIBER PO PACK
1.0000 | PACK | Freq: Two times a day (BID) | ORAL | Status: DC
  Administered 2020-09-17 (×2): 1
  Filled 2020-09-17 (×3): qty 1

## 2020-09-17 MED ORDER — INSULIN ASPART 100 UNIT/ML ~~LOC~~ SOLN
3.0000 [IU] | SUBCUTANEOUS | Status: DC
  Administered 2020-09-17 – 2020-09-18 (×6): 3 [IU] via SUBCUTANEOUS

## 2020-09-17 MED ORDER — INSULIN ASPART 100 UNIT/ML ~~LOC~~ SOLN
0.0000 [IU] | SUBCUTANEOUS | Status: DC
  Administered 2020-09-17: 7 [IU] via SUBCUTANEOUS
  Administered 2020-09-17: 11 [IU] via SUBCUTANEOUS
  Administered 2020-09-17: 7 [IU] via SUBCUTANEOUS
  Administered 2020-09-17 – 2020-09-18 (×3): 4 [IU] via SUBCUTANEOUS
  Administered 2020-09-18 – 2020-09-20 (×4): 3 [IU] via SUBCUTANEOUS
  Administered 2020-09-20 (×2): 4 [IU] via SUBCUTANEOUS
  Administered 2020-09-21: 3 [IU] via SUBCUTANEOUS
  Administered 2020-09-21: 4 [IU] via SUBCUTANEOUS
  Administered 2020-09-21: 3 [IU] via SUBCUTANEOUS
  Administered 2020-09-21: 4 [IU] via SUBCUTANEOUS
  Administered 2020-09-21 – 2020-09-22 (×3): 3 [IU] via SUBCUTANEOUS
  Administered 2020-09-22 (×2): 4 [IU] via SUBCUTANEOUS
  Administered 2020-09-22: 3 [IU] via SUBCUTANEOUS
  Administered 2020-09-22 (×2): 4 [IU] via SUBCUTANEOUS
  Administered 2020-09-22: 7 [IU] via SUBCUTANEOUS
  Administered 2020-09-23: 4 [IU] via SUBCUTANEOUS
  Administered 2020-09-23 (×2): 3 [IU] via SUBCUTANEOUS
  Administered 2020-09-23 – 2020-09-24 (×2): 4 [IU] via SUBCUTANEOUS
  Administered 2020-09-24 (×2): 3 [IU] via SUBCUTANEOUS
  Administered 2020-09-24: 4 [IU] via SUBCUTANEOUS
  Administered 2020-09-25 (×5): 3 [IU] via SUBCUTANEOUS
  Administered 2020-09-26: 4 [IU] via SUBCUTANEOUS
  Administered 2020-09-26: 3 [IU] via SUBCUTANEOUS
  Administered 2020-09-26: 4 [IU] via SUBCUTANEOUS
  Administered 2020-09-26 – 2020-09-27 (×3): 3 [IU] via SUBCUTANEOUS
  Administered 2020-09-27 (×3): 4 [IU] via SUBCUTANEOUS
  Administered 2020-09-28: 7 [IU] via SUBCUTANEOUS
  Administered 2020-09-28: 3 [IU] via SUBCUTANEOUS
  Administered 2020-09-28: 4 [IU] via SUBCUTANEOUS
  Administered 2020-09-28: 3 [IU] via SUBCUTANEOUS
  Administered 2020-09-28: 4 [IU] via SUBCUTANEOUS
  Administered 2020-09-28: 7 [IU] via SUBCUTANEOUS
  Administered 2020-09-28: 4 [IU] via SUBCUTANEOUS
  Administered 2020-09-29: 3 [IU] via SUBCUTANEOUS
  Administered 2020-09-29: 7 [IU] via SUBCUTANEOUS
  Administered 2020-09-29 (×3): 4 [IU] via SUBCUTANEOUS
  Administered 2020-09-30: 3 [IU] via SUBCUTANEOUS
  Administered 2020-09-30: 4 [IU] via SUBCUTANEOUS
  Administered 2020-09-30 (×2): 7 [IU] via SUBCUTANEOUS
  Administered 2020-09-30 – 2020-10-02 (×8): 4 [IU] via SUBCUTANEOUS
  Administered 2020-10-02: 7 [IU] via SUBCUTANEOUS
  Administered 2020-10-02: 3 [IU] via SUBCUTANEOUS
  Administered 2020-10-02 – 2020-10-04 (×7): 4 [IU] via SUBCUTANEOUS
  Administered 2020-10-05: 15 [IU] via SUBCUTANEOUS
  Administered 2020-10-05: 4 [IU] via SUBCUTANEOUS
  Administered 2020-10-05: 15 [IU] via SUBCUTANEOUS
  Administered 2020-10-05 – 2020-10-06 (×2): 3 [IU] via SUBCUTANEOUS

## 2020-09-17 MED ORDER — SODIUM CHLORIDE 0.9 % IV SOLN
250.0000 mL | INTRAVENOUS | Status: DC
  Administered 2020-09-17: 250 mL via INTRAVENOUS

## 2020-09-17 NOTE — Progress Notes (Signed)
NAME:  Devin [name], MRN:  [id number], DOB:  10-03-1940, LOS: 7 ADMISSION DATE:  09/10/2020, CONSULTATION DATE:  09/12/20 REFERRING MD:  Triad, CHIEF COMPLAINT:  T11-12 Chance Fracture c/b acute on chronic hypercapnic RF   History of Present Illness:  Devin [name] is a 80 y.o. male here for medical stabilization prior to pursuing spine stabilization neurosurgery (connecting prior T7-11 and T11-L4 rods) following a fall with a relevant PMHx of restrictive lung disease, emphysema/COPD, adenocarcinoma s/p LUL lobectomy (2016), ankylosing spondylitis, Crohn's, Afib, nephrolithiasis, HTN, DM, obesity, and thyroid and prostate cancer (see below). Per chart review, patient fell at a bookstore 3/31. New onset back pain - denied head trauma or LOC at that time. Saw PCP on 4/1 and underwent thoracic, lumbar, and chest x-ray. Found to have new T11 Chance fracture.He also has a stable L1 compression fracture, extensive lumbar spondylosis and postsurgical changes. Additionally had a left seventh rib fracture anteriorly and a left pleural effusion without any obvious pneumothorax. Patient was intubated in the ER on 4/3 for agitation and hypoxemia. Was transferred to [facility] for cardiopulmonary stabilization and eventual neurosurgical intervention. Neurosurgery intervention planned for today, 09/15/20.   Pertinent  Medical History  Restrictive lung disease (likely 2/2 prior opioid use for pain management and posture), emphysema, COPD, adenocarcinoma s/p LUL lobectomy (2016), Ankylosing spondylitis, Crohn's, Psoriasis, Afib, nephrolithiasis, HTN, DM, obesity, thyroid cancer s/p RAI and total thyroidectomy (1999), and prostate cancer s/p prostatectomy (2012) but with recurrence.      Significant Hospital Events: Including procedures, antibiotic start and stop dates in addition to other pertinent events    4/3 - Intubated in ER for agitation   4/3 - Resp viral panel PCR Neg  4/3 - Respiratory culture from  ET sent  4/3 - Blood Cx, negative at 48 hrs   4/6 - Min Vent requirements, HDS  4/8 - Neurosurgery plans to link prior rod systems across T11 Fx  4/9- failed attempt extubation  Interim History / Subjective:   Despite passing an SBT yesterday the patient failed extubation need to be reintubated possibly due to subglottic stenosis.  Denies significant back pain today.  Objective   Blood pressure (!) 132/53, pulse 79, temperature 98.9 F (37.2 C), temperature source Oral, resp. rate 16, height 5' 9.02" (1.753 m), weight 92.5 kg, SpO2 99 %.    Vent Mode: PRVC FiO2 (%):  [30 %] 30 % Set Rate:  [16 bmp] 16 bmp Vt Set:  [560 mL] 560 mL PEEP:  [5 cmH20] 5 cmH20 Plateau Pressure:  [18 cmH20-23 cmH20] 23 cmH20   Intake/Output Summary (Last 24 hours) at 09/17/2020 1017 Last data filed at 09/17/2020 0800 Gross per 24 hour  Intake 4168.66 ml  Output 1080 ml  Net 3088.66 ml   Filed Weights   09/15/20 0400 09/16/20 0400 09/17/20 0400  Weight: 92.5 kg 94.1 kg 92.5 kg    Examination: General: Chronically ill appearing, skin appears fragile with multiple bruises, abrasions HENT: MMM, ETT in place  Lungs: Chest is clear to auscultation bilaterally.  Minimal secretions.  Will take up to 700 mL tidal volume Cardiovascular: RRR, S1/S2, no m/r/g, no peripheral or trace edema  Abdomen: Soft and nontender.  Loose stools and rectal tube. Extremities: multiple abrasions and fragile skin per above Neuro: More awake today, 4-5 strength in the UE and LE, follows commands appropriately bilaterally, PERRL, will to track to voice. GU: Foley catheter in place  Labs/imaging that I havepersonally reviewed  (right click and "Reselect  all SmartList Selections" daily)  Mild rise in creatinine.  Resolved Hospital Problem list   N/A  Assessment & Plan:   Critically ill due to acute hypoxic hypercarbic respiratory failure requiring mechanical ventilation. Chronic respiratory failure due to COPD  combination restrictive lung disease due to kyphoscoliosis and ankylosing spondylitis. Status post stabilization Chance fracture T11-T12 Generalized frailty with recent 40 pound weight loss over the last 6 to 12 months. Recent lung cancer resection. 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 -Short course of steroids for possible glottic edema. -Reattempt SBT and extubation tomorrow.  Seems brighter today with better strength. -Explained to patient's wife that patient is showing signs of significant frailty and likely had more significant respiratory disease and initially appreciated.  She appears surprised at his degree of frailty as he was apparently quite functional at home. -Increase IV fluids -Will increase steroids -Fiber for tube feed diarrhea. A palliative care consult may eventually be appropriate.  Best practice (right click and "Reselect all SmartList Selections" daily)  Diet:  NPO restart tube feeding Pain/Anxiety/Delirium protocol (if indicated): Yes (RASS goal 0) VAP protocol (if indicated): Yes DVT prophylaxis: SCD GI prophylaxis: PPI Glucose control:  SSI Yes Central venous access:  N/A Arterial line:  N/A Foley:  Yes, and it is still needed Mobility:  OOB  PT consulted: N/A Last date of multidisciplinary goals of care discussion: Patient's wife was updated today/9. Code Status:  full code Disposition: ICU  Labs   CBC: Recent Labs  Lab 09/10/20 1031 09/11/20 0227 09/13/20 0225 09/14/20 0026 09/15/20 0059 09/16/20 0253 09/17/20 0415  WBC 14.1*   < > 11.6* 12.0* 16.4* 17.3* 14.6*  NEUTROABS 12.3*  --   --   --  14.4*  --   --   HGB 12.8*   < > 10.7* 11.3* 11.3* 10.2* 10.0*  HCT 41.8   < > 32.7* 34.2* 34.6* 31.9* 31.9*  MCV 96.8   < > 93.2 92.9 93.5 94.9 99.1  PLT 251   < > 198 222 277 277 299   < > = values in this interval not displayed.    Basic Metabolic  Panel: Recent Labs  Lab 09/11/20 1337 09/11/20 1739 09/12/20 0240 09/12/20 1701 09/13/20 0225 09/14/20 0026 09/15/20 0059 09/16/20 0253 09/17/20 0415  NA  --   --  131*  --  131* 132* 136 135 138  K  --   --  3.0*  --  2.6* 4.4 4.2 4.8 4.5  CL  --   --  94*  --  94* 100 100 101 102  CO2  --   --  27  --  27 27 27 27 27   GLUCOSE  --   --  119*  --  162* 198* 248* 236* 267*  BUN  --   --  28*  --  26* 21 17 18  30*  CREATININE  --   --  1.66*  --  1.44* 1.25* 1.11 1.22 1.44*  CALCIUM  --   --  7.3*  --  7.6* 7.9* 8.5* 8.3* 8.5*  MG 1.4* 1.9 1.7 1.9  --   --   --   --   --   PHOS <1.0* 1.1* 2.3* 2.8  --   --   --   --   --    GFR: Estimated Creatinine Clearance: 48.2 mL/min (A) (by C-G formula based on SCr of 1.44 mg/dL (H)). Recent Labs  Lab 09/10/20 1043 09/10/20 1533 09/11/20 0227 09/14/20 0026 09/15/20 0059 09/16/20 0253 09/17/20 0415  WBC  --   --    < > 12.0* 16.4* 17.3* 14.6*  LATICACIDVEN 1.0 2.7*  --   --   --   --   --    < > = values in this interval not displayed.    Liver Function Tests: Recent Labs  Lab 09/10/20 1031  AST 24  ALT 21  ALKPHOS 66  BILITOT 0.8  PROT 6.7  ALBUMIN 3.6   Recent Labs  Lab 09/10/20 1031  LIPASE 21   No results for input(s): AMMONIA in the last 168 hours.  ABG    Component Value Date/Time   PHART 7.570 (H) 09/10/2020 1358   PCO2ART 35.2 09/10/2020 1358   PO2ART 148 (H) 09/10/2020 1358   HCO3 32.3 (H) 09/10/2020 1358   TCO2 23.0 03/17/2015 0540   ACIDBASEDEF 3.6 (H) 03/17/2015 0540   O2SAT 98.6 09/10/2020 1358     Coagulation Profile: Recent Labs  Lab 09/15/20 0059  INR 1.1    Cardiac Enzymes: No results for input(s): CKTOTAL, CKMB, CKMBINDEX, TROPONINI in the last 168 hours.  HbA1C: Hgb A1c MFr Bld  Date/Time Value Ref Range Status  09/10/2020 03:33 PM 6.4 (H) 4.8 - 5.6 % Final    Comment:    (NOTE) Pre diabetes:          5.7%-6.4%  Diabetes:              >6.4%  Glycemic control for    <7.0% adults with diabetes   07/03/2020 02:46 PM 6.2 (H) 4.8 - 5.6 % Final    Comment:    (NOTE) Pre diabetes:          5.7%-6.4%  Diabetes:              >6.4%  Glycemic control for   <7.0% adults with diabetes     CBG: Recent Labs  Lab 09/16/20 1538 09/16/20 1923 09/16/20 2303 09/17/20 0309 09/17/20 0736  GLUCAP 202* 211* 297* 303* 231*   CRITICAL CARE Performed by: Kipp Brood   Total critical care time: 40 minutes  Critical care time was exclusive of separately billable procedures and treating other patients.  Critical care was necessary to treat or prevent imminent or life-threatening deterioration.  Critical care was time spent personally by me on the following activities: development of treatment plan with patient and/or surrogate as well as nursing, discussions with consultants, evaluation of patient's response to treatment, examination of patient, obtaining history from patient or surrogate, ordering and performing treatments and interventions, ordering and review of laboratory studies, ordering and review of radiographic studies, pulse oximetry, re-evaluation of patient's condition and participation in multidisciplinary rounds.  Kipp Brood, MD [facility] ICU Physician [place]  Pager: [phone] Mobile: [phone] After hours: [phone].

## 2020-09-17 NOTE — Evaluation (Signed)
Physical Therapy Evaluation Patient Details Name: Devin [name] MRN: [id number] DOB: 1940/08/18 Today's Date: 09/17/2020   History of Present Illness  Pt is a 80 y.o. M who fell at a bookstore 3/31 and obtained T11 chance fracture, stable L1 compression fracture, extensive lumbar spondylosis and postsurgical changes. Additionally had left 7th rib fx anteriorly and left pleural effusion. Pt intubated in ER on 4/3 for agitation and hypoxemia. 4/8 s/p rod connection for T11 fx. 4/9 failed attempt extubation. Significant PMH: restrictive lung disease, emphysema/COPD, adenocarcinoma s/p LUL lobectomy (2016), Afib, DM, thyroid and prostate CA.  Clinical Impression  Pt evaluated on vent, 30% FiO2, 5 PEEP. Pt alert, following all one step commands. Performing bed mobility with min assist and able to sit up on edge of bed for ~15 minutes. Notified RN to reinforce back dressing while sitting; RN also present to reinforce ETT connection as frequently coming loose. Pt with good strength in all extremities; expect he will be able to progress transfers next session with brace donned. Currently recommending post acute rehab to maximize functional independence and decrease caregiver burden.    Follow Up Recommendations CIR    Equipment Recommendations  Other (comment) (TBA)    Recommendations for Other Services       Precautions / Restrictions Precautions Precautions: Fall;Other (comment);Back Precaution Booklet Issued: No Precaution Comments: ETT, rectal tube Required Braces or Orthoses: Spinal Brace Spinal Brace: Thoracolumbosacral orthotic;Applied in sitting position Restrictions Weight Bearing Restrictions: No      Mobility  Bed Mobility Overal bed mobility: Needs Assistance Bed Mobility: Rolling;Sidelying to Sit;Sit to Sidelying Rolling: Min guard Sidelying to sit: Min assist     Sit to sidelying: Min assist General bed mobility comments: Min guard to roll towards right side, minA for  trunk support to upright, minA for LE negotiation back into bed    Transfers                    Ambulation/Gait                Stairs            Wheelchair Mobility    Modified Rankin (Stroke Patients Only)       Balance Overall balance assessment: Needs assistance Sitting-balance support: Feet supported Sitting balance-Leahy Scale: Good                                       Pertinent Vitals/Pain Pain Assessment: Faces Faces Pain Scale: Hurts a little bit Pain Location: back Pain Descriptors / Indicators: Operative site guarding Pain Intervention(s): Monitored during session    Home Living Family/patient expects to be discharged to:: Unsure                 Additional Comments: No family in room    Prior Function Level of Independence: Independent               Hand Dominance   Dominant Hand: Right    Extremity/Trunk Assessment   Upper Extremity Assessment Upper Extremity Assessment: RUE deficits/detail;LUE deficits/detail RUE Deficits / Details: Strength 5/5 LUE Deficits / Details: Strength 5/5    Lower Extremity Assessment Lower Extremity Assessment: RLE deficits/detail;LLE deficits/detail RLE Deficits / Details: Strength 5/5 LLE Deficits / Details: Strength 5/5    Cervical / Trunk Assessment Cervical / Trunk Assessment: Other exceptions Cervical / Trunk Exceptions: s/p spinal sx  Communication  Communication: Other (comment) (intubated)  Cognition Arousal/Alertness: Awake/alert Behavior During Therapy: WFL for tasks assessed/performed Overall Cognitive Status: Difficult to assess                                 General Comments: Following all 1 step commands, oriented with yes/no questions      General Comments      Exercises General Exercises - Lower Extremity Long Arc Quad: Both;5 reps;Seated   Assessment/Plan    PT Assessment Patient needs continued PT services  PT  Problem List Decreased strength;Decreased activity tolerance;Decreased balance;Decreased mobility;Pain       PT Treatment Interventions Gait training;DME instruction;Functional mobility training;Therapeutic activities;Therapeutic exercise;Balance training;Patient/family education    PT Goals (Current goals can be found in the Care Plan section)  Acute Rehab PT Goals Patient Stated Goal: unable (intubated) PT Goal Formulation: With patient Time For Goal Achievement: 10/01/20 Potential to Achieve Goals: Good    Frequency Min 5X/week   Barriers to discharge        Co-evaluation               AM-PAC PT "6 Clicks" Mobility  Outcome Measure Help needed turning from your back to your side while in a flat bed without using bedrails?: A Little Help needed moving from lying on your back to sitting on the side of a flat bed without using bedrails?: A Little Help needed moving to and from a bed to a chair (including a wheelchair)?: A Little Help needed standing up from a chair using your arms (e.g., wheelchair or bedside chair)?: A Little Help needed to walk in hospital room?: A Lot Help needed climbing 3-5 steps with a railing? : Total 6 Click Score: 15    End of Session Equipment Utilized During Treatment: Oxygen Activity Tolerance: Patient tolerated treatment well Patient left: in bed;with call bell/phone within reach Nurse Communication: Mobility status PT Visit Diagnosis: Pain;Difficulty in walking, not elsewhere classified (R26.2) Pain - part of body:  (back)    Time: 6294-7654 PT Time Calculation (min) (ACUTE ONLY): 26 min   Charges:   PT Evaluation $PT Eval High Complexity: 1 High PT Treatments $Therapeutic Activity: 8-22 mins        Wyona Almas, PT, DPT Acute Rehabilitation Services Pager [phone] Office [phone]   Deno Etienne 09/17/2020, 5:02 PM

## 2020-09-17 NOTE — Progress Notes (Signed)
   09/17/20 0809  Airway 6.5 mm  Placement Date/Time: 09/16/20 1115   Difficult airway due to:: Difficult airway - due to edematous airway  Grade View: Grade 1  Placed By: ICU physician  Airway Device: Endotracheal Tube  Laryngoscope Blade: MAC;3  ETT Types: Oral  Size (mm): 6.5 mm  ...  Secured at (cm) 25 cm  Measured From Lips  Secured Location Left  Secured By Actuary Repositioned Yes  Prone position No  Cuff Pressure (cm H2O) Green OR 18-26 CmH2O  Site Condition Dry  Adult Ventilator Settings  Vent Type Servo i  Humidity HME  Vent Mode PRVC  Vt Set 560 mL  Set Rate 16 bmp  FiO2 (%) 30 %  I Time 1 Sec(s)  PEEP 5 cmH20  Adult Ventilator Measurements  Peak Airway Pressure 22 L/min  Mean Airway Pressure 11 cmH20  Plateau Pressure 23 cmH20  Resp Rate Spontaneous 0 br/min  Resp Rate Total 16 br/min  Exhaled Vt 556 mL  Measured Ve 12.7 mL  I:E Ratio Measured 1:1  Auto PEEP 0 cmH20  Total PEEP 5 cmH20  SpO2 99 %  Adult Ventilator Alarms  Alarms On Y  Ve High Alarm 24 L/min  Ve Low Alarm 4 L/min  Resp Rate High Alarm 38 br/min  Resp Rate Low Alarm 10  PEEP Low Alarm 3 cmH2O  Press High Alarm 45 cmH2O  VAP Prevention  Equipment wiped down Yes  Daily Weaning Assessment  Daily Assessment of Readiness to Wean Wean protocol criteria not met  Reason not met Apnea (when he falls asleep)  Breath Sounds  Bilateral Breath Sounds Diminished  Airway Suctioning/Secretions  Suction Type ETT  Suction Device  Catheter  Secretion Amount Small  Secretion Color Tan  Secretion Consistency Thick  Suction Tolerance Tolerated well  Suctioning Adverse Effects None

## 2020-09-17 NOTE — Progress Notes (Signed)
Subjective: Patient reintubated yesterday afternoon for respiratory failure   Objective: Vital signs in last 24 hours: Temp:  [97.6 F (36.4 C)-98.9 F (37.2 C)] 98.9 F (37.2 C) (04/10 0400) Pulse Rate:  [67-109] 79 (04/10 0800) Resp:  [0-22] 16 (04/10 0800) BP: (64-154)/(44-101) 132/53 (04/10 0800) SpO2:  [54 %-100 %] 99 % (04/10 0809) FiO2 (%):  [30 %] 30 % (04/10 0809) Weight:  [92.5 kg] 92.5 kg (04/10 0400)  Intake/Output from previous day: 04/09 0701 - 04/10 0700 In: 3279.9 [I.V.:2975.5; NG/GT:304.4] Out: 1080 [Urine:630; Stool:450] Intake/Output this shift: Total I/O In: 1110 [I.V.:200; NG/GT:910] Out: -   Intubated, sedated Dressing c/d W/d to bilateral LEs symmetrically with good strength, localizes UEs  Lab Results: Recent Labs    09/16/20 0253 09/17/20 0415  WBC 17.3* 14.6*  HGB 10.2* 10.0*  HCT 31.9* 31.9*  PLT 277 299   BMET Recent Labs    09/16/20 0253 09/17/20 0415  NA 135 138  K 4.8 4.5  CL 101 102  CO2 27 27  GLUCOSE 236* 267*  BUN 18 30*  CREATININE 1.22 1.44*  CALCIUM 8.3* 8.5*    Studies/Results: DG Thoracolumabar Spine  Result Date: 09/15/2020 CLINICAL DATA:  Elective surgery. T9 through L1 posterior instrumentation. EXAM: THORACOLUMBAR SPINE 1V COMPARISON:  Recent chest radiograph. FINDINGS: Single frontal fluoroscopic spot view of the lower thoracic and upper lumbar spine demonstrates fusion hardware, not entirely included in the field of view. Levels are difficult to delineate on this single view. IMPRESSION: Single frontal fluoroscopic spot view of the lower thoracic and upper lumbar spine demonstrates spinal fusion hardware. Please reference operative report for details. Electronically Signed   By: Keith Rake M.D.   On: 09/15/2020 17:25   DG Abd 1 View  Result Date: 09/16/2020 CLINICAL DATA:  OG tube placement EXAM: ABDOMEN - 1 VIEW COMPARISON:  09/10/2020 FINDINGS: 1233 hours. OG tube tip is in the distal stomach near the  pylorus. Extensive thoracolumbar fusion hardware evident. Visualized abdomen demonstrates nonspecific bowel gas pattern. IMPRESSION: OG tube tip is in the distal stomach. Electronically Signed   By: Misty Stanley M.D.   On: 09/16/2020 13:10   DG CHEST PORT 1 VIEW  Result Date: 09/16/2020 CLINICAL DATA:  Intubated.  Thoracic spine surgery yesterday. EXAM: PORTABLE CHEST 1 VIEW COMPARISON:  Chest x-ray from yesterday. FINDINGS: Unchanged endotracheal and enteric tubes. New right internal jugular central venous catheter with tip near the cavoatrial junction. The heart size and mediastinal contours are within normal limits. Improving atelectasis at the lung bases. Unchanged small bilateral pleural effusions. No pneumothorax. Prior thoracolumbar fusion. IMPRESSION: 1. New right internal jugular central venous catheter. No pneumothorax. 2. Improving bibasilar atelectasis. Unchanged small bilateral pleural effusions. Electronically Signed   By: Titus Dubin M.D.   On: 09/16/2020 12:13   DG C-Arm 1-60 Min  Result Date: 09/15/2020 CLINICAL DATA:  Elective surgery. T9 through L1 posterior instrumentation. EXAM: THORACOLUMBAR SPINE 1V COMPARISON:  Recent chest radiograph. FINDINGS: Single frontal fluoroscopic spot view of the lower thoracic and upper lumbar spine demonstrates fusion hardware, not entirely included in the field of view. Levels are difficult to delineate on this single view. IMPRESSION: Single frontal fluoroscopic spot view of the lower thoracic and upper lumbar spine demonstrates spinal fusion hardware. Please reference operative report for details. Electronically Signed   By: Keith Rake M.D.   On: 09/15/2020 17:25    Assessment/Plan: S/p reconnection of thoracolumbar instrumentation - cont supportive pulmonary care  Vallarie Mare 09/17/2020, 10:46  AM

## 2020-09-17 NOTE — Plan of Care (Signed)
  Problem: Nutrition: Goal: Adequate nutrition will be maintained Outcome: Progressing   

## 2020-09-18 ENCOUNTER — Encounter (HOSPITAL_COMMUNITY): Payer: Self-pay | Admitting: Neurological Surgery

## 2020-09-18 ENCOUNTER — Inpatient Hospital Stay (HOSPITAL_COMMUNITY): Payer: Medicare Other

## 2020-09-18 DIAGNOSIS — R0689 Other abnormalities of breathing: Secondary | ICD-10-CM

## 2020-09-18 DIAGNOSIS — G9340 Encephalopathy, unspecified: Secondary | ICD-10-CM

## 2020-09-18 LAB — CBC WITH DIFFERENTIAL/PLATELET
Abs Immature Granulocytes: 0.24 10*3/uL — ABNORMAL HIGH (ref 0.00–0.07)
Basophils Absolute: 0 10*3/uL (ref 0.0–0.1)
Basophils Relative: 0 %
Eosinophils Absolute: 0 10*3/uL (ref 0.0–0.5)
Eosinophils Relative: 0 %
HCT: 31.4 % — ABNORMAL LOW (ref 39.0–52.0)
Hemoglobin: 9.8 g/dL — ABNORMAL LOW (ref 13.0–17.0)
Immature Granulocytes: 1 %
Lymphocytes Relative: 4 %
Lymphs Abs: 0.7 10*3/uL (ref 0.7–4.0)
MCH: 30.6 pg (ref 26.0–34.0)
MCHC: 31.2 g/dL (ref 30.0–36.0)
MCV: 98.1 fL (ref 80.0–100.0)
Monocytes Absolute: 1 10*3/uL (ref 0.1–1.0)
Monocytes Relative: 6 %
Neutro Abs: 15.1 10*3/uL — ABNORMAL HIGH (ref 1.7–7.7)
Neutrophils Relative %: 89 %
Platelets: 336 10*3/uL (ref 150–400)
RBC: 3.2 MIL/uL — ABNORMAL LOW (ref 4.22–5.81)
RDW: 14.6 % (ref 11.5–15.5)
WBC: 17 10*3/uL — ABNORMAL HIGH (ref 4.0–10.5)
nRBC: 0 % (ref 0.0–0.2)

## 2020-09-18 LAB — BASIC METABOLIC PANEL
Anion gap: 3 — ABNORMAL LOW (ref 5–15)
BUN: 37 mg/dL — ABNORMAL HIGH (ref 8–23)
CO2: 28 mmol/L (ref 22–32)
Calcium: 8.2 mg/dL — ABNORMAL LOW (ref 8.9–10.3)
Chloride: 106 mmol/L (ref 98–111)
Creatinine, Ser: 1.24 mg/dL (ref 0.61–1.24)
GFR, Estimated: 60 mL/min — ABNORMAL LOW (ref >60)
Glucose, Bld: 163 mg/dL — ABNORMAL HIGH (ref 70–99)
Potassium: 4 mmol/L (ref 3.5–5.1)
Sodium: 137 mmol/L (ref 135–145)

## 2020-09-18 LAB — GLUCOSE, CAPILLARY
Glucose-Capillary: 111 mg/dL — ABNORMAL HIGH (ref 70–99)
Glucose-Capillary: 131 mg/dL — ABNORMAL HIGH (ref 70–99)
Glucose-Capillary: 155 mg/dL — ABNORMAL HIGH (ref 70–99)
Glucose-Capillary: 178 mg/dL — ABNORMAL HIGH (ref 70–99)
Glucose-Capillary: 70 mg/dL (ref 70–99)
Glucose-Capillary: 95 mg/dL (ref 70–99)

## 2020-09-18 MED ORDER — FENTANYL CITRATE (PF) 100 MCG/2ML IJ SOLN: 25.0000 ug | INTRAMUSCULAR | Status: DC | PRN

## 2020-09-18 MED ORDER — ETOMIDATE 2 MG/ML IV SOLN
INTRAVENOUS | Status: AC
  Administered 2020-09-18: 20 mg
  Filled 2020-09-18: qty 20

## 2020-09-18 MED ORDER — MIDAZOLAM HCL 2 MG/2ML IJ SOLN
INTRAMUSCULAR | Status: AC
  Administered 2020-09-18: 2 mg
  Filled 2020-09-18: qty 2

## 2020-09-18 MED ORDER — FREE WATER
100.0000 mL | Freq: Four times a day (QID) | Status: DC
  Administered 2020-09-18 – 2020-09-26 (×30): 100 mL

## 2020-09-18 MED ORDER — VITAL AF 1.2 CAL PO LIQD
1000.0000 mL | ORAL | Status: DC
  Administered 2020-09-18: 1000 mL

## 2020-09-18 MED ORDER — POLYETHYLENE GLYCOL 3350 17 G PO PACK: 17.0000 g | PACK | Freq: Every day | ORAL | Status: DC

## 2020-09-18 MED ORDER — DOCUSATE SODIUM 50 MG/5ML PO LIQD: 100.0000 mg | Freq: Two times a day (BID) | ORAL | Status: DC

## 2020-09-18 MED ORDER — MIDAZOLAM HCL 2 MG/2ML IJ SOLN: 1.0000 mg | INTRAMUSCULAR | Status: DC | PRN

## 2020-09-18 MED ORDER — VITAL AF 1.2 CAL PO LIQD
1000.0000 mL | ORAL | Status: DC
  Administered 2020-09-18 – 2020-09-21 (×4): 1000 mL

## 2020-09-18 MED ORDER — FENTANYL CITRATE (PF) 100 MCG/2ML IJ SOLN
INTRAMUSCULAR | Status: AC
  Administered 2020-09-18: 50 ug
  Filled 2020-09-18: qty 2

## 2020-09-18 MED ORDER — LIDOCAINE HCL URETHRAL/MUCOSAL 2 % EX GEL
1.0000 "application " | Freq: Once | CUTANEOUS | Status: AC
  Administered 2020-09-18: 1 via URETHRAL
  Filled 2020-09-18: qty 11

## 2020-09-18 MED ORDER — MIDAZOLAM HCL 2 MG/2ML IJ SOLN
1.0000 mg | INTRAMUSCULAR | Status: DC | PRN
  Administered 2020-09-18 – 2020-09-19 (×3): 1 mg via INTRAVENOUS
  Filled 2020-09-18 (×3): qty 2

## 2020-09-18 MED ORDER — FENTANYL CITRATE (PF) 100 MCG/2ML IJ SOLN
25.0000 ug | INTRAMUSCULAR | Status: DC | PRN
  Administered 2020-09-18 – 2020-09-19 (×2): 100 ug via INTRAVENOUS
  Administered 2020-09-19: 50 ug via INTRAVENOUS
  Administered 2020-09-19 – 2020-09-21 (×3): 100 ug via INTRAVENOUS
  Administered 2020-09-21 – 2020-09-22 (×3): 50 ug via INTRAVENOUS
  Administered 2020-09-22: 100 ug via INTRAVENOUS
  Administered 2020-09-23 – 2020-09-25 (×3): 50 ug via INTRAVENOUS
  Filled 2020-09-18 (×15): qty 2

## 2020-09-18 MED ORDER — ROCURONIUM BROMIDE 10 MG/ML (PF) SYRINGE
PREFILLED_SYRINGE | INTRAVENOUS | Status: AC
  Administered 2020-09-18: 80 mg
  Filled 2020-09-18: qty 10

## 2020-09-18 NOTE — Progress Notes (Signed)
PT Cancellation Note  Patient Details Name: Devin [name] MRN: [id number] DOB: 22-Sep-1940   Cancelled Treatment:    Reason Eval/Treat Not Completed: Medical issues which prohibited therapyDiscussed with RN; pt re-intubated. Plan for trach tomorrow and will re-assess.  Wyona Almas, PT, DPT Acute Rehabilitation Services Pager [phone] Office [phone]    Deno Etienne 09/18/2020, 1:09 PM

## 2020-09-18 NOTE — Progress Notes (Addendum)
Extubated earlier this am. He initially did well stated "never better". Was using his yaunkar to assist w/ coughing. Was on nasal canula. Doing well.   Interval From 9:34 to 1100. Cough mechanics weak. These progressed to point he needed NTS, developed worsening tachycardia and hypertension. Could not cough, mental status became more depressed. Decision made to re-intubate.   Airway assessment: some mild residual cord swelling but able to pass 7.5 w/out difficulty. Seems like cough mechanics at this point are the limiting factor.  Plan Will d/w wife. Think best course at this point is trach  cct 15 minutes included: direct face to face, discussion w/ care team as well as d/w attending MD  Erick Colace ACNP-BC [place] Pager # [phone] OR # [phone] if no answer

## 2020-09-18 NOTE — Progress Notes (Signed)
Placed pt on PSV 10/5 per wean protocol and pt is tolerating well. RN aware. RT to continue to monitor.

## 2020-09-18 NOTE — Procedures (Signed)
Intubation Procedure Note  Devin [name]  [id number]  1941/04/25  Date:09/18/20  Time:11:12 AM   Provider Performing:Pete E Kary Kos    Procedure: Intubation (31427)  Indication(s) Respiratory Failure  Consent Unable to obtain consent due to emergent nature of procedure.   Anesthesia Etomidate, Versed, Fentanyl and Rocuronium   Time Out Verified patient identification, verified procedure, site/side was marked, verified correct patient position, special equipment/implants available, medications/allergies/relevant history reviewed, required imaging and test results available.   Sterile Technique Usual hand hygeine, masks, and gloves were used   Procedure Description Patient positioned in bed supine.  Sedation given as noted above.  Patient was intubated with endotracheal tube using Glidescope.  View was Grade 2 only posterior commissure .  Number of attempts was 1.  Colorimetric CO2 detector was consistent with tracheal placement.   Complications/Tolerance None; patient tolerated the procedure well. Chest X-ray is ordered to verify placement.   EBL Minimal   Specimen(s) None  Erick Colace ACNP-BC [place] Pager # [phone] OR # [phone] if no answer

## 2020-09-18 NOTE — Progress Notes (Signed)
Nutrition Follow-up  DOCUMENTATION CODES:   Not applicable  INTERVENTION:   Tube Feeding via Cortrak:  Vital AF 1.2 at 65 ml/hr Provides 1872 kcals, 117 g of protein and 1264 mL of free water  NUTRITION DIAGNOSIS:   Inadequate oral intake related to inability to eat as evidenced by NPO status.  Being addressed via TF   GOAL:   Patient will meet greater than or equal to 90% of their needs  Progressing  MONITOR:   Vent status,TF tolerance,Labs,Weight trends  REASON FOR ASSESSMENT:   Ventilator,Consult Enteral/tube feeding initiation and management  ASSESSMENT:   80 y/o male with medical history of afib, adenocarcinoma of the L lung, ankylosing spondylitis, Crohn's disease, nephrolithiasis, HTN, DM, obesity, osteoporosis, prostate cancer, psoriasis, thyroid cancer, and recently dx COPD. He presented to the ED due to back pain. He experienced a fall on 3/31; seen by his PCP for thoracic spine xray, lumbar spine xray, and CXR of the L ribs. He has had increased back pain since the fall and having nausea, dry heaving, and no vomiting episodes. He is not on chemo at this time.  4/03 Intubated 4/09 Failed Extubation 4/11 Extubated, Re-Intubated, Cortrak placed  Extubated this AM but required re-intubation. Noted plan for trach tomorrow  Cortrak placed today as well  Pt has been tolerating Vital AF 1.2 at 65 ml/hr; plan to resume via Cortrak  Labs: reviewed Meds: ss novolog, lantus    Diet Order:   Diet Order            Diet NPO time specified  Diet effective now                 EDUCATION NEEDS:   No education needs have been identified at this time  Skin:  Skin Assessment: Skin Integrity Issues: Skin Integrity Issues:: Stage II Stage II: sacrum, mid lower back  Last BM:  4/11 rectal tube  Height:   Ht Readings from Last 1 Encounters:  09/10/20 5' 9.02" (1.753 m)    Weight:   Wt Readings from Last 1 Encounters:  09/18/20 94 kg    Ideal Body  Weight:  72.73 kg  BMI:  Body mass index is 30.59 kg/m.  Estimated Nutritional Needs:   Kcal:  1872 kcal  Protein:  100-125 grams  Fluid:  >/= 2 L/day   Kerman Passey MS, RDN, LDN, CNSC Registered Dietitian III Clinical Nutrition RD Pager and On-Call Pager Number Located in [place]

## 2020-09-18 NOTE — Procedures (Signed)
Cortrak  Person Inserting Tube:  Seva Chancy E, RD Tube Type:  Cortrak - 43 inches Tube Location:  Left nare Initial Placement:  Stomach Secured by: Bridle Technique Used to Measure Tube Placement:  Documented cm marking at nare/ corner of mouth Cortrak Secured At:  70 cm   Cortrak Tube Team Note:  Consult received to place a Cortrak feeding tube.   No x-ray is required. RN may begin using tube.   If the tube becomes dislodged please keep the tube and contact the Cortrak team at [email or web address] ([email or web address]) for replacement.  If after hours and replacement cannot be delayed, place a NG tube and confirm placement with an abdominal x-ray.    Arneshia Ade, MS, RD, LDN RD pager number and weekend/on-call pager number located in Amion.    

## 2020-09-18 NOTE — Progress Notes (Addendum)
NAME:  Devin [name], MRN:  [id number], DOB:  [birth year], LOS: 49 ADMISSION DATE:  09/10/2020, CONSULTATION DATE:  09/12/20 REFERRING MD:  [facility], CHIEF COMPLAINT:  T11-12 Chance Fracture c/b acute on chronic hypercapnic RF   History of Present Illness:  Devin [name] is a 80 y.o. male here for medical stabilization prior to pursuing spine stabilization neurosurgery (connecting prior T7-11 and T11-L4 rods) following a fall with a relevant PMHx of restrictive lung disease, emphysema/COPD, adenocarcinoma s/p LUL lobectomy (2016), ankylosing spondylitis, Crohn's, Afib, nephrolithiasis, HTN, DM, obesity, and thyroid and prostate cancer (see below). Per chart review, patient fell at a bookstore 3/31. New onset back pain - denied head trauma or LOC at that time. Saw PCP on 4/1 and underwent thoracic, lumbar, and chest x-ray. Found to have new T11 Chance fracture.He also has a stable L1 compression fracture, extensive lumbar spondylosis and postsurgical changes. Additionally had a left seventh rib fracture anteriorly and a left pleural effusion without any obvious pneumothorax. Patient was intubated in the ER on 4/3 for agitation and hypoxemia. Was transferred to [facility] for cardiopulmonary stabilization and eventual neurosurgical intervention. Neurosurgery intervention planned for today, 09/15/20.   Pertinent  Medical History  Restrictive lung disease (likely 2/2 prior opioid use for pain management and posture), emphysema, COPD, adenocarcinoma s/p LUL lobectomy (2016), Ankylosing spondylitis, Crohn's, Psoriasis, Afib, nephrolithiasis, HTN, DM, obesity, thyroid cancer s/p RAI and total thyroidectomy (1999), and prostate cancer s/p prostatectomy (2012) but with recurrence.      Significant Hospital Events: Including procedures, antibiotic start and stop dates in addition to other pertinent events    4/3 - Intubated in ER for agitation   4/3 - Resp viral panel PCR Neg  4/3 - Respiratory culture from  ET sent  4/3 - Blood Cx, negative at 48 hrs   4/6 - Min Vent requirements, HDS  4/8 - Neurosurgery plans to link prior rod systems across T11 Fx  4/9- failed attempt extubation. Decadron X 4 doses for airway edema   4/10 IVFs increased for mild Cr bump  4/11 cr improved w/ IVFs.  passed SBT. Had ETT cuff leak so extubated.   Interim History / Subjective:  Looks great   Objective   Blood pressure (Abnormal) 168/69, pulse 87, temperature 99.2 F (37.3 C), temperature source Axillary, resp. rate (Abnormal) 24, height 5' 9.02" (1.753 m), weight 94 kg, SpO2 100 %.    Vent Mode: PSV;CPAP FiO2 (%):  [30 %] 30 % Set Rate:  [16 bmp] 16 bmp Vt Set:  [560 mL] 560 mL PEEP:  [5 cmH20] 5 cmH20 Pressure Support:  [5 cmH20-10 cmH20] 5 cmH20 Plateau Pressure:  [10 FWY63-78 cmH20] 20 cmH20   Intake/Output Summary (Last 24 hours) at 09/18/2020 0906 Last data filed at 09/18/2020 0800 Gross per 24 hour  Intake 3438.22 ml  Output 1525 ml  Net 1913.22 ml   Filed Weights   09/16/20 0400 09/17/20 0400 09/18/20 0500  Weight: 94.1 kg 92.5 kg 94 kg    Examination: General: This is a 80 year old white male he is currently awake following commands appears to be in no acute distress on pressure support ventilation HEENT normocephalic atraumatic no jugular venous distention appreciated he is orally intubated. Pulmonary: Clear to auscultation currently pressure support 5/PEEP 5, tidal volumes in the 5-600 range without accessory use.  Checked cuff leak, which was positive Cardiac: Regular rate and rhythm Abdomen: Soft, has orogastric tube for tube feeds, tolerating tube feeds, does have liquid stool in Flexi-Seal  in place Extremities: Warm dry brisk capillary refill dependent edema Neuro: Awake follows commands moves all extremities generalized weakness appreciated  Labs/imaging that I havepersonally reviewed  (right click and "Reselect all SmartList Selections" daily)  Reviewed  Resolved Hospital  Problem list   N/A  Assessment & Plan:   Acute hypoxic respiratory failure requiring mechanical ventilation in the setting of restrictive lung disease from kyphoscoliosis and ankylosing spondylitis, and obstructive lung disease in the setting of COPD - initially intubated for agitated delirium and pain -Failed extubation on 4/9 felt secondary to upper airway obstruction and possibly vocal cord edema versus subglottic stenosis Last portable chest x-ray on 4/9 showed endotracheal tube in satisfactory position bibasilar atelectasis -Passing spontaneous breathing trial this morning, cuff leak present on upper airway assessment -Completed steroids for upper airway edema Plan Discontinue sedation Having difficult airway cart at bedside then proceed with extubation Pulse oximetry Continue bronchodilators Aspiration precaution If fails extubation will need tracheostomy A.m. chest x-ray Incentive spirometry   Status post stabilization Chance fracture T11-T12 Plan Postoperative care per neurosurgery He is going to need extensive PT/OT  Leukocytosis without fever -Certainly aspiration risk however may all just be reactive Plan A.m. chest x-ray I do not think this needs to interfere with extubation  Remote history of lung cancer status post lung resection of left upper lobe.  Has known nodule left lower lobe since 2021 which has been under observation Plan Outpatient CT chest Outpatient oncology follow-up  Generalized frailty with recent 40 pound weight loss over the last 6 to 12 months.  Certainly raising consideration for return to malignancy Plan Outpatient follow-up as above  Mild azotemia with improved AKI following increased IV fluids Plan Continue maintenance IV fluids Repeat a.m. chemistry  Mild hyperglycemia Plan Sliding scale insulin  Hypothyroidism Plan Synthroid  Diarrhea Plan Monitor. May get better after he starts real food  Best Practice (right click and  "Reselect all SmartList Selections" daily)   Diet:  NPO If oral type of diet:  Pain/Anxiety/Delirium protocol (if indicated): No VAP protocol (if indicated): Yes DVT prophylaxis: Other (comment) GI prophylaxis: PPI Glucose control:  SSI Yes and Basal insulin Yes Central venous access:  Yes, and it is no longer needed Arterial line:  N/A Foley:  Yes, and it is no longer needed Mobility:  OOB  PT consulted Studies pending: none Culture data pending: none Last reviewed culture data:today Antibiotics:NA  Antibiotic de-escalation: Not indicated Stop date: does not have Planned stop date (if determined)NA Daily labs: requested Code Status:  full code Prognosis: Life-threating Last date of multidisciplinary goals of care discussion pending  Disposition: Continue present management   My ct time 40 minutes  this included: 1/3 time chart/medical record and data review, 1/3 time face to face w/ bedside discussion of plan of care w/ RN and RT and 1/3 time dictating plan of care.   Erick Colace ACNP-BC [place] Pager # [phone] OR # [phone] if no answer

## 2020-09-18 NOTE — Progress Notes (Signed)
Inpatient Rehab Admissions Coordinator Note:   Per therapy recommendations, pt was screened for CIR candidacy by Clemens Catholic, [place] CCC-SLP. At this time, Pt. Recently extubated, only attempting bed level exercises with therapies.    Clemens Catholic, [place], [place] Admissions Coordinator  [phone] ([place]) [phone] (office)

## 2020-09-18 NOTE — Procedures (Signed)
Extubation Procedure Note  Patient Details:   Name: Devin [name] DOB: Oct 09, 1940 MRN: [id number]   Airway Documentation:    Vent end date: 09/18/20 Vent end time: 0934   Evaluation  O2 sats: stable throughout Complications: No apparent complications Patient did tolerate procedure well. Bilateral Breath Sounds: Diminished,Rhonchi   Pt extubated to 2L [place] per MD order. Pt had positive cuff leak prior to extubation. No stridor noted. Pt able to voice his name.  Vilinda Blanks 09/18/2020, 9:34 AM

## 2020-09-18 NOTE — Progress Notes (Signed)
Tracheal aspirate collected and sent to lab.

## 2020-09-18 NOTE — Progress Notes (Signed)
OT Cancellation Note  Patient Details Name: Devin [name] MRN: [id number] DOB: 1941/05/05   Cancelled Treatment:    Reason Eval/Treat Not Completed: Patient not medically ready (reintubated)  Billey Chang, OTR/L  Acute Rehabilitation Services Pager: [phone] Office: [phone] .  09/18/2020, 1:08 PM

## 2020-09-19 ENCOUNTER — Inpatient Hospital Stay (HOSPITAL_COMMUNITY): Payer: Medicare Other

## 2020-09-19 DIAGNOSIS — J9601 Acute respiratory failure with hypoxia: Secondary | ICD-10-CM | POA: Diagnosis not present

## 2020-09-19 DIAGNOSIS — S271XXA Traumatic hemothorax, initial encounter: Secondary | ICD-10-CM | POA: Diagnosis not present

## 2020-09-19 DIAGNOSIS — J9621 Acute and chronic respiratory failure with hypoxia: Secondary | ICD-10-CM | POA: Diagnosis not present

## 2020-09-19 DIAGNOSIS — J9622 Acute and chronic respiratory failure with hypercapnia: Secondary | ICD-10-CM | POA: Diagnosis not present

## 2020-09-19 DIAGNOSIS — J9602 Acute respiratory failure with hypercapnia: Secondary | ICD-10-CM | POA: Diagnosis not present

## 2020-09-19 DIAGNOSIS — J150 Pneumonia due to Klebsiella pneumoniae: Secondary | ICD-10-CM | POA: Diagnosis not present

## 2020-09-19 LAB — CBC WITH DIFFERENTIAL/PLATELET
Abs Immature Granulocytes: 0.27 10*3/uL — ABNORMAL HIGH (ref 0.00–0.07)
Basophils Absolute: 0 10*3/uL (ref 0.0–0.1)
Basophils Relative: 0 %
Eosinophils Absolute: 0.1 10*3/uL (ref 0.0–0.5)
Eosinophils Relative: 0 %
HCT: 34.2 % — ABNORMAL LOW (ref 39.0–52.0)
Hemoglobin: 10.9 g/dL — ABNORMAL LOW (ref 13.0–17.0)
Immature Granulocytes: 2 %
Lymphocytes Relative: 8 %
Lymphs Abs: 1.4 10*3/uL (ref 0.7–4.0)
MCH: 30.3 pg (ref 26.0–34.0)
MCHC: 31.9 g/dL (ref 30.0–36.0)
MCV: 95 fL (ref 80.0–100.0)
Monocytes Absolute: 1.3 10*3/uL — ABNORMAL HIGH (ref 0.1–1.0)
Monocytes Relative: 7 %
Neutro Abs: 15.5 10*3/uL — ABNORMAL HIGH (ref 1.7–7.7)
Neutrophils Relative %: 83 %
Platelets: 423 10*3/uL — ABNORMAL HIGH (ref 150–400)
RBC: 3.6 MIL/uL — ABNORMAL LOW (ref 4.22–5.81)
RDW: 14.3 % (ref 11.5–15.5)
WBC: 18.6 10*3/uL — ABNORMAL HIGH (ref 4.0–10.5)
nRBC: 0 % (ref 0.0–0.2)

## 2020-09-19 LAB — BASIC METABOLIC PANEL
Anion gap: 6 (ref 5–15)
BUN: 31 mg/dL — ABNORMAL HIGH (ref 8–23)
CO2: 27 mmol/L (ref 22–32)
Calcium: 8.3 mg/dL — ABNORMAL LOW (ref 8.9–10.3)
Chloride: 104 mmol/L (ref 98–111)
Creatinine, Ser: 1.14 mg/dL (ref 0.61–1.24)
GFR, Estimated: >60 mL/min (ref >60)
Glucose, Bld: 86 mg/dL (ref 70–99)
Potassium: 3.5 mmol/L (ref 3.5–5.1)
Sodium: 137 mmol/L (ref 135–145)

## 2020-09-19 LAB — GLUCOSE, CAPILLARY
Glucose-Capillary: 118 mg/dL — ABNORMAL HIGH (ref 70–99)
Glucose-Capillary: 135 mg/dL — ABNORMAL HIGH (ref 70–99)
Glucose-Capillary: 147 mg/dL — ABNORMAL HIGH (ref 70–99)
Glucose-Capillary: 60 mg/dL — ABNORMAL LOW (ref 70–99)
Glucose-Capillary: 72 mg/dL (ref 70–99)
Glucose-Capillary: 80 mg/dL (ref 70–99)
Glucose-Capillary: 94 mg/dL (ref 70–99)

## 2020-09-19 MED ORDER — MIDAZOLAM HCL 2 MG/2ML IJ SOLN
6.0000 mg | Freq: Once | INTRAMUSCULAR | Status: AC
  Administered 2020-09-19: 4 mg via INTRAVENOUS

## 2020-09-19 MED ORDER — PHENYLEPHRINE HCL-NACL 10-0.9 MG/250ML-% IV SOLN
INTRAVENOUS | Status: AC
  Filled 2020-09-19: qty 250

## 2020-09-19 MED ORDER — DILTIAZEM LOAD VIA INFUSION
10.0000 mg | Freq: Once | INTRAVENOUS | Status: AC
  Administered 2020-09-19: 10 mg via INTRAVENOUS
  Filled 2020-09-19: qty 10

## 2020-09-19 MED ORDER — POTASSIUM CHLORIDE 20 MEQ PO PACK
40.0000 meq | PACK | Freq: Two times a day (BID) | ORAL | Status: AC
  Administered 2020-09-19 (×2): 40 meq
  Filled 2020-09-19 (×3): qty 2

## 2020-09-19 MED ORDER — MAGNESIUM SULFATE 2 GM/50ML IV SOLN
2.0000 g | Freq: Once | INTRAVENOUS | Status: AC
  Administered 2020-09-19: 2 g via INTRAVENOUS
  Filled 2020-09-19: qty 50

## 2020-09-19 MED ORDER — CHLORHEXIDINE GLUCONATE CLOTH 2 % EX PADS
6.0000 | MEDICATED_PAD | Freq: Every day | CUTANEOUS | Status: DC
  Administered 2020-09-21 – 2020-09-29 (×9): 6 via TOPICAL

## 2020-09-19 MED ORDER — FENTANYL CITRATE (PF) 100 MCG/2ML IJ SOLN
200.0000 ug | Freq: Once | INTRAMUSCULAR | Status: AC
  Administered 2020-09-19: 200 ug via INTRAVENOUS

## 2020-09-19 MED ORDER — MIDAZOLAM HCL 2 MG/2ML IJ SOLN
INTRAMUSCULAR | Status: AC
  Filled 2020-09-19: qty 6

## 2020-09-19 MED ORDER — DEXTROSE 50 % IV SOLN
INTRAVENOUS | Status: AC
  Filled 2020-09-19: qty 50

## 2020-09-19 MED ORDER — AMIODARONE IV BOLUS ONLY 150 MG/100ML
150.0000 mg | Freq: Once | INTRAVENOUS | Status: AC
  Administered 2020-09-19: 150 mg via INTRAVENOUS
  Filled 2020-09-19: qty 100

## 2020-09-19 MED ORDER — PHENYLEPHRINE HCL-NACL 10-0.9 MG/250ML-% IV SOLN
0.0000 ug/min | INTRAVENOUS | Status: DC
  Administered 2020-09-19: 40 ug/min via INTRAVENOUS

## 2020-09-19 MED ORDER — VECURONIUM BROMIDE 10 MG IV SOLR
10.0000 mg | Freq: Once | INTRAVENOUS | Status: AC
  Administered 2020-09-19: 10 mg via INTRAVENOUS

## 2020-09-19 MED ORDER — DEXTROSE 50 % IV SOLN
50.0000 mL | Freq: Once | INTRAVENOUS | Status: AC
  Administered 2020-09-19: 50 mL via INTRAVENOUS

## 2020-09-19 MED ORDER — PROPOFOL 10 MG/ML IV BOLUS
INTRAVENOUS | Status: AC
  Filled 2020-09-19: qty 20

## 2020-09-19 MED ORDER — LACTATED RINGERS IV BOLUS
500.0000 mL | Freq: Once | INTRAVENOUS | Status: AC
  Administered 2020-09-19: 500 mL via INTRAVENOUS

## 2020-09-19 MED ORDER — VECURONIUM BROMIDE 10 MG IV SOLR
INTRAVENOUS | Status: AC
  Filled 2020-09-19: qty 10

## 2020-09-19 MED ORDER — AMIODARONE IV BOLUS ONLY 150 MG/100ML
150.0000 mg | Freq: Once | INTRAVENOUS | Status: DC
  Filled 2020-09-19: qty 100

## 2020-09-19 MED ORDER — PROPOFOL 10 MG/ML IV BOLUS
30.0000 mg | Freq: Once | INTRAVENOUS | Status: AC
  Administered 2020-09-19: 30 mg via INTRAVENOUS

## 2020-09-19 MED ORDER — DILTIAZEM HCL-DEXTROSE 125-5 MG/125ML-% IV SOLN (PREMIX)
5.0000 mg/h | INTRAVENOUS | Status: DC
  Administered 2020-09-19: 5 mg/h via INTRAVENOUS
  Filled 2020-09-19: qty 125

## 2020-09-19 NOTE — Procedures (Signed)
Percutaneous Tracheostomy Procedure Note   LASTER APPLING  [id number]  December 12, 1940  Date:09/19/20  Time:2:30 PM   Provider Performing:Pete E Kary Kos  Procedure: Percutaneous Tracheostomy with Bronchoscopic Guidance (31600)  Indication(s) Vent weaning   Consent Risks of the procedure as well as the alternatives and risks of each were explained to the patient and/or caregiver.  Consent for the procedure was obtained.  Anesthesia Etomidate, Versed, Fentanyl, Vecuronium   Time Out Verified patient identification, verified procedure, site/side was marked, verified correct patient position, special equipment/implants available, medications/allergies/relevant history reviewed, required imaging and test results available.   Sterile Technique Maximal sterile technique including sterile barrier drape, hand hygiene, sterile gown, sterile gloves, mask, hair covering.    Procedure Description Appropriate anatomy identified by palpation.  Patient's neck prepped and draped in sterile fashion.  1% lidocaine with epinephrine was used to anesthetize skin overlying neck.  1.5cm incision made and blunt dissection performed until tracheal rings could be easily palpated.   Then a size 6 Shiley tracheostomy was placed under bronchoscopic visualization using usual Seldinger technique and serial dilation.   Bronchoscope confirmed placement above the carina.  Tracheostomy was sutured in place with adhesive pad to protect skin under pressure.    Patient connected to ventilator.   Complications/Tolerance None; patient tolerated the procedure well. Chest X-ray is ordered to confirm no post-procedural complication.   EBL Minimal   Specimen(s) None   Erick Colace ACNP-BC [place] Pager # [phone] OR # [phone] if no answer

## 2020-09-19 NOTE — Progress Notes (Signed)
OT Cancellation Note  Patient Details Name: SHEA KAPUR MRN: [id number] DOB: 02/27/1941   Cancelled Treatment:    Reason Eval/Treat Not Completed: Patient not medically ready (afib RVR)   Billey Chang, OTR/L  Acute Rehabilitation Services Pager: [phone] Office: [phone] .  09/19/2020, 9:53 AM

## 2020-09-19 NOTE — Progress Notes (Signed)
PT Cancellation Note  Patient Details Name: Devin [name] MRN: [id number] DOB: 1941/02/27   Cancelled Treatment:    Reason Eval/Treat Not Completed: Patient not medically ready Discussed with RN; plan for trach today. Will assess pt following if medically appropriate.  Wyona Almas, PT, DPT Acute Rehabilitation Services Pager [phone] Office [phone]    Deno Etienne 09/19/2020, 11:44 AM

## 2020-09-19 NOTE — Progress Notes (Signed)
eLink Physician-Brief Progress Note Patient Name: Devin [name] DOB: 1940-11-06 MRN: [id number]   Date of Service  09/19/2020  HPI/Events of Note  Patient went into Afib with RVR, rates 150-160.  eICU Interventions  Cardizem bolus + infusion ordered.        Kerry Kass Masa Lubin 09/19/2020, 2:52 AM

## 2020-09-19 NOTE — Progress Notes (Signed)
eLink Physician-Brief Progress Note Patient Name: Devin [name] DOB: December 13, 1940 MRN: [id number]   Date of Service  09/19/2020  HPI/Events of Note  Marked hypotension associated with persistent Afib with RVR, rate 160's, Cardizem infusion off.  eICU Interventions  LR 500 ml iv bolus x 1, Phenylephrine infusion stat, PCCM Ground crew asked to see patient for possible cardioversion if hypotension and RVR persist.        Kerry Kass Lupe Bonner 09/19/2020, 5:09 AM

## 2020-09-19 NOTE — Progress Notes (Signed)
Inpatient Diabetes Program Recommendations  AACE/ADA: New Consensus Statement on Inpatient Glycemic Control   Target Ranges:  Prepandial:   less than 140 mg/dL      Peak postprandial:   less than 180 mg/dL (1-2 hours)      Critically ill patients:  140 - 180 mg/dL   Results for KEYLON, LABELLE" (MRN [id number]) as of 09/19/2020 11:10  Ref. Range 09/18/2020 07:29 09/18/2020 11:15 09/18/2020 15:19 09/18/2020 19:28 09/18/2020 23:52 09/19/2020 03:41 09/19/2020 07:26 09/19/2020 08:07  Glucose-Capillary Latest Ref Range: 70 - 99 mg/dL 155 (H) 95 111 (H) 131 (H) 70 94 60 (L) 118 (H)   Review of Glycemic Control  Current orders for Inpatient glycemic control: Lantus 8 units BID, Novolog 0-20 units Q4H  Inpatient Diabetes Program Recommendations:    Insulin: Noted CBG 60 mg/dl this morning. Please consider decreasing Lantus to 5 units BID.  Thanks, Barnie Alderman, RN, MSN, CDE Diabetes Coordinator Inpatient Diabetes Program [phone] (Team Pager from 8am to 5pm)

## 2020-09-19 NOTE — Progress Notes (Signed)
eLink Physician-Brief Progress Note Patient Name: Devin [name] DOB: Jul 06, 1940 MRN: [id number]   Date of Service  09/19/2020  HPI/Events of Note  Cardizem bolus and infusion at 10 mg / hour has failed to control patient's ventricular response rate and patient's blood pressure is now soft on 10 mg  / hour of Cardizem.  eICU Interventions  Devin cut back Cardizem infusion to 5 mg / hour and give Amiodarone 150 mg iv x 1, if patient is more responsive to Amiodarone Devin start Amiodarone infusion and wean off Cardizem infusion.     Intervention Category Major Interventions: Arrhythmia - evaluation and management  Frederik Pear 09/19/2020, 4:07 AM

## 2020-09-19 NOTE — Progress Notes (Addendum)
   09/19/20 0232  Provider Notification  Provider Name/Title eLink MD  Date Provider Notified 09/19/20  Time Provider Notified [phone]  Notification Type Call  Notification Reason Change in status (a-fib RVR)  Provider response See new orders (amiodorone bolus only)  Date of Provider Response 09/19/20  Time of Provider Response [phone]   Provider changed his mind, no amio was started. Cardizem gtt instead.

## 2020-09-19 NOTE — Procedures (Signed)
Diagnostic Bronchoscopy  Devin [name]  [id number]  11/05/1940  Date:09/19/20  Time:2:25 PM   Provider Performing:Gillie Fleites   Procedure: Diagnostic Bronchoscopy (77414)  Indication(s) Assist with direct visualization of tracheostomy placement  Consent Risks of the procedure as well as the alternatives and risks of each were explained to the patient and/or caregiver.  Consent for the procedure was obtained.   Anesthesia See separate tracheostomy note   Time Out Verified patient identification, verified procedure, site/side was marked, verified correct patient position, special equipment/implants available, medications/allergies/relevant history reviewed, required imaging and test results available.   Sterile Technique Usual hand hygiene, masks, gowns, and gloves were used   Procedure Description Bronchoscope advanced through endotracheal tube and into airway.  After suctioning out tracheal secretions, bronchoscope used to provide direct visualization of tracheostomy placement.   Complications/Tolerance None; patient tolerated the procedure well.   EBL None  Specimen(s) None  Devin Brood, MD [facility] ICU Physician [place]  Pager: [phone] Or Epic Secure Chat After hours: [phone].  09/19/2020, 2:26 PM

## 2020-09-19 NOTE — Progress Notes (Signed)
NAME:  Devin [name], MRN:  [id number], DOB:  1941-06-02, LOS: 9 ADMISSION DATE:  09/10/2020, CONSULTATION DATE:  09/12/20 REFERRING MD:  [facility], CHIEF COMPLAINT:  T11-12 Chance Fracture c/b acute on chronic hypercapnic RF   History of Present Illness:  Devin [name] is a 80 y.o. male here for medical stabilization prior to pursuing spine stabilization neurosurgery (connecting prior T7-11 and T11-L4 rods) following a fall with a relevant PMHx of restrictive lung disease, emphysema/COPD, adenocarcinoma s/p LUL lobectomy (2016), ankylosing spondylitis, Crohn's, Afib, nephrolithiasis, HTN, DM, obesity, and thyroid and prostate cancer (see below). Per chart review, patient fell at a bookstore 3/31. New onset back pain - denied head trauma or LOC at that time. Saw PCP on 4/1 and underwent thoracic, lumbar, and chest x-ray. Found to have new T11 Chance fracture.He also has a stable L1 compression fracture, extensive lumbar spondylosis and postsurgical changes. Additionally had a left seventh rib fracture anteriorly and a left pleural effusion without any obvious pneumothorax. Patient was intubated in the ER on 4/3 for agitation and hypoxemia. Was transferred to [facility] for cardiopulmonary stabilization and eventual neurosurgical intervention. Neurosurgery intervention planned for today, 09/15/20.   Pertinent  Medical History  Restrictive lung disease (likely 2/2 prior opioid use for pain management and posture), emphysema, COPD, adenocarcinoma s/p LUL lobectomy (2016), Ankylosing spondylitis, Crohn's, Psoriasis, Afib, nephrolithiasis, HTN, DM, obesity, thyroid cancer s/p RAI and total thyroidectomy (1999), and prostate cancer s/p prostatectomy (2012) but with recurrence.      Significant Hospital Events: Including procedures, antibiotic start and stop dates in addition to other pertinent events    4/3 - Intubated in ER for agitation   4/3 - Resp viral panel PCR Neg  4/3 - Respiratory culture from  ET sent  4/3 - Blood Cx, negative at 48 hrs   4/6 - Min Vent requirements, HDS  4/8 - Neurosurgery plans to link prior rod systems across T11 Fx  4/9- failed attempt extubation. Decadron X 4 doses for airway edema   4/10 IVFs increased for mild Cr bump  4/11 cr improved w/ IVFs.  passed SBT. Had ETT cuff leak so extubated.   Interim History / Subjective:   Failed attempt at extubation yesterday due to inability to effectively cough secretions.  Re-intubated with less difficulty as swelling had diminished.  Wife consented to tracheostomy as bridge to recovery  Objective   Blood pressure 131/62, pulse (!) 161, temperature 99 F (37.2 C), temperature source Axillary, resp. rate 16, height 5' 9.02" (1.753 m), weight 94 kg, SpO2 99 %.    Vent Mode: PRVC FiO2 (%):  [30 %] 30 % Set Rate:  [16 bmp] 16 bmp Vt Set:  [560 mL] 560 mL PEEP:  [5 cmH20] 5 cmH20 Plateau Pressure:  [18 cmH20-22 cmH20] 21 cmH20   Intake/Output Summary (Last 24 hours) at 09/19/2020 1008 Last data filed at 09/19/2020 0160 Gross per 24 hour  Intake 3149.47 ml  Output 2475 ml  Net 674.47 ml   Filed Weights   09/16/20 0400 09/17/20 0400 09/18/20 0500  Weight: 94.1 kg 92.5 kg 94 kg    Examination: General: This is a 80 year old white male he is currently awake following commands appears to be in no acute distress on pressure support ventilation HEENT normocephalic atraumatic no jugular venous distention appreciated he is orally intubated. Pulmonary: Clear to auscultation, minimal secretions.  Cardiac: Regular rate and rhythm Abdomen: Soft, has orogastric tube for tube feeds, tolerating tube feeds, does have liquid stool in  Flexi-Seal in place Extremities: Warm dry brisk capillary refill dependent edema Neuro: Awake follows commands moves all extremities generalized weakness appreciated  Labs/imaging that I havepersonally reviewed  (right click and "Reselect all SmartList Selections" daily)   Reviewed  Resolved Hospital Problem list   N/A  Assessment & Plan:   Acute hypoxic respiratory failure requiring mechanical ventilation in the setting of restrictive lung disease from kyphoscoliosis and ankylosing spondylitis, and obstructive lung disease in the setting of COPD - failed extubation twice.  Plan For tracheostomy today as bridge to recovery of respiratory muscle and strength  Status post stabilization Chance fracture T11-T12 Plan Postoperative care per neurosurgery He is going to need extensive PT/OT   Remote history of lung cancer status post lung resection of left upper lobe.  Has known nodule left lower lobe since 2021 which has been under observation Plan Outpatient CT chest Outpatient oncology follow-up  Generalized frailty with recent 40 pound weight loss over the last 6 to 12 months.  Certainly raising consideration for return to malignancy Plan Outpatient follow-up as above   Mild hyperglycemia Plan Sliding scale insulin + basal insulin.   Hypothyroidism Plan Synthroid  Diarrhea Plan Monitor. May get better after he starts real food  Best Practice (right click and "Reselect all SmartList Selections" daily)   Diet:  NPO If oral type of diet:  Pain/Anxiety/Delirium protocol (if indicated): No VAP protocol (if indicated): Yes DVT prophylaxis: Other (comment) GI prophylaxis: PPI Glucose control:  SSI Yes and Basal insulin Yes Central venous access:  Yes, and it is no longer needed Arterial line:  N/A Foley:  Yes, and it is no longer needed Mobility:  OOB  PT consulted Studies pending: none Culture data pending: none Last reviewed culture data:today Antibiotics:NA  Antibiotic de-escalation: Not indicated Stop date: does not have Planned stop date (if determined)NA Daily labs: requested Code Status:  full code Prognosis: Life-threating Last date of multidisciplinary goals of care discussion pending  Disposition: Continue present  management   CRITICAL CARE Performed by: Kipp Brood   Total critical care time: 35 minutes  Critical care time was exclusive of separately billable procedures and treating other patients.  Critical care was necessary to treat or prevent imminent or life-threatening deterioration.  Critical care was time spent personally by me on the following activities: development of treatment plan with patient and/or surrogate as well as nursing, discussions with consultants, evaluation of patient's response to treatment, examination of patient, obtaining history from patient or surrogate, ordering and performing treatments and interventions, ordering and review of laboratory studies, ordering and review of radiographic studies, pulse oximetry, re-evaluation of patient's condition and participation in multidisciplinary rounds.  Kipp Brood, MD [facility] ICU Physician [place]  Pager: [phone] Mobile: [phone] After hours: [phone].

## 2020-09-20 DIAGNOSIS — R197 Diarrhea, unspecified: Secondary | ICD-10-CM | POA: Diagnosis not present

## 2020-09-20 DIAGNOSIS — J9601 Acute respiratory failure with hypoxia: Secondary | ICD-10-CM | POA: Diagnosis not present

## 2020-09-20 DIAGNOSIS — K5080 Crohn's disease of both small and large intestine without complications: Secondary | ICD-10-CM

## 2020-09-20 DIAGNOSIS — J9602 Acute respiratory failure with hypercapnia: Secondary | ICD-10-CM | POA: Diagnosis not present

## 2020-09-20 DIAGNOSIS — Z789 Other specified health status: Secondary | ICD-10-CM

## 2020-09-20 LAB — CULTURE, RESPIRATORY W GRAM STAIN

## 2020-09-20 LAB — GLUCOSE, CAPILLARY
Glucose-Capillary: 153 mg/dL — ABNORMAL HIGH (ref 70–99)
Glucose-Capillary: 173 mg/dL — ABNORMAL HIGH (ref 70–99)
Glucose-Capillary: 129 mg/dL — ABNORMAL HIGH (ref 70–99)
Glucose-Capillary: 145 mg/dL — ABNORMAL HIGH (ref 70–99)
Glucose-Capillary: 105 mg/dL — ABNORMAL HIGH (ref 70–99)
Glucose-Capillary: 141 mg/dL — ABNORMAL HIGH (ref 70–99)

## 2020-09-20 LAB — CBC WITH DIFFERENTIAL/PLATELET
Abs Immature Granulocytes: 0.4 10*3/uL — ABNORMAL HIGH (ref 0.00–0.07)
Basophils Absolute: 0.1 10*3/uL (ref 0.0–0.1)
Basophils Relative: 0 %
Eosinophils Absolute: 0.1 10*3/uL (ref 0.0–0.5)
Eosinophils Relative: 1 %
HCT: 32.8 % — ABNORMAL LOW (ref 39.0–52.0)
Hemoglobin: 10.3 g/dL — ABNORMAL LOW (ref 13.0–17.0)
Immature Granulocytes: 3 %
Lymphocytes Relative: 6 %
Lymphs Abs: 0.9 10*3/uL (ref 0.7–4.0)
MCH: 30.7 pg (ref 26.0–34.0)
MCHC: 31.4 g/dL (ref 30.0–36.0)
MCV: 97.6 fL (ref 80.0–100.0)
Monocytes Absolute: 0.9 10*3/uL (ref 0.1–1.0)
Monocytes Relative: 6 %
Neutro Abs: 12.9 10*3/uL — ABNORMAL HIGH (ref 1.7–7.7)
Neutrophils Relative %: 84 %
Platelets: 264 10*3/uL (ref 150–400)
RBC: 3.36 MIL/uL — ABNORMAL LOW (ref 4.22–5.81)
RDW: 14.7 % (ref 11.5–15.5)
WBC: 15.2 10*3/uL — ABNORMAL HIGH (ref 4.0–10.5)
nRBC: 0 % (ref 0.0–0.2)

## 2020-09-20 LAB — BASIC METABOLIC PANEL
Anion gap: 6 (ref 5–15)
BUN: 26 mg/dL — ABNORMAL HIGH (ref 8–23)
CO2: 25 mmol/L (ref 22–32)
Calcium: 7.9 mg/dL — ABNORMAL LOW (ref 8.9–10.3)
Chloride: 106 mmol/L (ref 98–111)
Creatinine, Ser: 0.99 mg/dL (ref 0.61–1.24)
GFR, Estimated: >60 mL/min (ref >60)
Glucose, Bld: 156 mg/dL — ABNORMAL HIGH (ref 70–99)
Potassium: 4.2 mmol/L (ref 3.5–5.1)
Sodium: 137 mmol/L (ref 135–145)

## 2020-09-20 LAB — C DIFFICILE (CDIFF) QUICK SCRN (NO PCR REFLEX)
C Diff antigen: NEGATIVE
C Diff interpretation: NOT DETECTED
C Diff toxin: NEGATIVE

## 2020-09-20 MED ORDER — INSULIN GLARGINE 100 UNIT/ML ~~LOC~~ SOLN
10.0000 [IU] | Freq: Two times a day (BID) | SUBCUTANEOUS | Status: DC
  Administered 2020-09-20 – 2020-09-30 (×21): 10 [IU] via SUBCUTANEOUS
  Filled 2020-09-20 (×23): qty 0.1

## 2020-09-20 MED ORDER — MESALAMINE ER 250 MG PO CPCR
1000.0000 mg | ORAL_CAPSULE | Freq: Three times a day (TID) | ORAL | Status: DC
  Administered 2020-09-20: 1000 mg via ORAL
  Filled 2020-09-20 (×3): qty 4

## 2020-09-20 MED ORDER — HYDROCHLOROTHIAZIDE 10 MG/ML ORAL SUSPENSION
12.5000 mg | Freq: Every day | ORAL | Status: DC
  Filled 2020-09-20: qty 1.88

## 2020-09-20 MED ORDER — HEPARIN SODIUM (PORCINE) 5000 UNIT/ML IJ SOLN
5000.0000 [IU] | Freq: Three times a day (TID) | INTRAMUSCULAR | Status: DC
  Administered 2020-09-20 – 2020-09-22 (×6): 5000 [IU] via SUBCUTANEOUS
  Filled 2020-09-20 (×7): qty 1

## 2020-09-20 MED ORDER — BETHANECHOL CHLORIDE 10 MG PO TABS
5.0000 mg | ORAL_TABLET | Freq: Four times a day (QID) | ORAL | Status: DC
  Administered 2020-09-20 – 2020-10-06 (×61): 5 mg via NASOGASTRIC
  Filled 2020-09-20 (×58): qty 1
  Filled 2020-09-20: qty 0.5
  Filled 2020-09-20 (×5): qty 1

## 2020-09-20 MED ORDER — NUTRISOURCE FIBER PO PACK
1.0000 | PACK | Freq: Two times a day (BID) | ORAL | Status: DC
  Administered 2020-09-20 – 2020-10-06 (×33): 1
  Filled 2020-09-20 (×35): qty 1

## 2020-09-20 MED ORDER — DILTIAZEM 12 MG/ML ORAL SUSPENSION
30.0000 mg | Freq: Four times a day (QID) | ORAL | Status: DC
  Administered 2020-09-20 – 2020-10-06 (×60): 30 mg
  Filled 2020-09-20 (×13): qty 3
  Filled 2020-09-20: qty 6
  Filled 2020-09-20 (×14): qty 3
  Filled 2020-09-20: qty 6
  Filled 2020-09-20 (×18): qty 3
  Filled 2020-09-20: qty 6
  Filled 2020-09-20 (×16): qty 3
  Filled 2020-09-20: qty 6
  Filled 2020-09-20 (×8): qty 3

## 2020-09-20 MED ORDER — HYDROCHLOROTHIAZIDE 12.5 MG PO CAPS: 12.5000 mg | ORAL_CAPSULE | Freq: Every day | ORAL | Status: DC

## 2020-09-20 MED ORDER — GERHARDT'S BUTT CREAM
TOPICAL_CREAM | Freq: Two times a day (BID) | CUTANEOUS | Status: DC
  Administered 2020-09-21 – 2020-10-06 (×9): 1 via TOPICAL
  Filled 2020-09-20 (×2): qty 1

## 2020-09-20 MED ORDER — HYDROCHLOROTHIAZIDE 25 MG PO TABS
12.5000 mg | ORAL_TABLET | Freq: Every day | ORAL | Status: DC
  Administered 2020-09-20 – 2020-09-24 (×5): 12.5 mg
  Filled 2020-09-20 (×5): qty 1

## 2020-09-20 MED ORDER — OXYCODONE HCL 5 MG/5ML PO SOLN
5.0000 mg | Freq: Four times a day (QID) | ORAL | Status: DC | PRN
  Administered 2020-09-20 – 2020-10-02 (×27): 5 mg
  Filled 2020-09-20 (×26): qty 5

## 2020-09-20 MED ORDER — ATENOLOL 25 MG PO TABS
25.0000 mg | ORAL_TABLET | Freq: Two times a day (BID) | ORAL | Status: DC
  Administered 2020-09-20 – 2020-10-06 (×32): 25 mg via NASOGASTRIC
  Filled 2020-09-20 (×33): qty 1

## 2020-09-20 MED ORDER — SODIUM CHLORIDE 0.9 % IV SOLN
2.0000 g | INTRAVENOUS | Status: AC
  Administered 2020-09-20 – 2020-09-24 (×5): 2 g via INTRAVENOUS
  Filled 2020-09-20 (×2): qty 2
  Filled 2020-09-20: qty 20
  Filled 2020-09-20 (×2): qty 2

## 2020-09-20 MED ORDER — OXYCODONE HCL 5 MG/5ML PO SOLN
5.0000 mg | Freq: Four times a day (QID) | ORAL | Status: DC | PRN
  Filled 2020-09-20: qty 5

## 2020-09-20 NOTE — Consult Note (Signed)
[facility] Consult: 11:36 AM 09/20/2020  LOS: 10 days    Referring Provider: Dr Lynetta Mare.    Primary Care Physician:  Prince Solian, MD Primary Gastroenterologist:  Dr Richmond Campbell.   Reason for Consultation: Diarrhea.      HPI: Devin [name] is a 80 y.o. male.  PMH A. fib.  Chronic Eliquis.  Ileocecal Crohn's disease.  Ankylosing spondylitis.  Lung cancer, lobectomy 2016.  Prostate cancer, recurrent following prostatectomy 2012, previously on Lupron.  Emphysema/COPD/restrictive lung disease.  Obesity.  Hypertension.  IDDM.  Hypothyroidism.  Ureteral stone.  Cystoscopy and removal of stone, stent placement 07/03/2020. Additional surgeries include but not limited to cholecystectomy 04/2016.  Resection of cecum, appendix, terminal ileum 03/2007.  Hernia repair.  Meckel's resection.  Laparoscopy with LOA 04/2016. Prostatectomy 2003.  Total thyroidectomy.  Multiple spine surgeries.  Thoracotomy, lobectomy 2016.  At baseline patient is frail and 40 pound weight loss in the last 6 to 12 months.  Patient tripped and fell on 3/31. Imaging w compression fractures at T11/T12 and stable L1 compression fracture, extensive lumbar spondylosis, left seventh rib fracture, left pleural effusion but no pneumothorax.  CT treated with Robaxin, hydrocodone.  Was not tolerating incentive spirometry.  Lab work showed white cell count of 18 K and Augmentin initiated. Presented to the ED 4/3 and was eventually intubated in setting of agitation, acute on chronic hypercarbic respiratory failure.  Failed extubation twice and Tracheostomy placed 09/19/2020.  09/15/20 Underwent stabilization of Chance fracture T11/T12.  Critical care has been dealing with diarrhea in the setting of tube feeds.  Flexi-Seal in place. Has not had any stool  studies sent.  For Crohn's disease, outpatient meds include Asocol 1.6 g bid. PRN Imodium if community activities planned.  Colonoscopy 10/2015 which showed chronic colitis in all segments except the right. Colonoscopy 02/02/2018.  Unable to access procedure report but biopsies showed marked, chronic active inflammation with reactive epithelial changes consistent with anastomotic site. Colonoscopy fall 2021.  Looks like this may have been performed on 03/28/2020 but unable to access the report. Last GI office visit was 11/2019 when the symptoms were stable: 4-5, semiformed movements daily.   Good appetite.  Today the patient endorses the same, stable situation in terms of his bowel habits.  Current consistency of stool is liquid,  light brown: his norm.  Volume of stool output to Flexi-Seal was 500 mL on 4/11, 250 mL on 4/12 and about 225 to 250 cc output since last measurement last night. No nausea, vomiting, abdominal pain.  In fact he would love to have a hamburger.     Past Medical History:  Diagnosis Date  . A-fib ([place])   . 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 Ankylosing spondylitis ([place])    Diagnosed during lumbar fracture summer of 2016    . Crohn's disease ([place])   . Gout   . HIstory of basal cell cancer of face    THYROID CA HX  . History of kidney stones   . Hypertension   . Hypothyroidism   .  Impotence   . 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 Obesity (BMI 30-39.9)   . Osteoporosis    Pt completed 5 years of fosamax in 2013     . Prostate cancer with recurrence    Treated with prostatectomy with recurrence 2012 with Lupron treatment now him   . Psoriasis   . Thyroid cancer ([place]) 1999   Treated with RAI and total thyroidectomy   . Type II diabetes mellitus ([place])     Past Surgical History:  Procedure Laterality Date  . ABDOMINAL EXPLORATION SURGERY     for small bowel obstruction  . APPENDECTOMY  03/2007  . BACK SURGERY     . BASAL CELL CARCINOMA EXCISION  "several"   "head"  . CARDIAC CATHETERIZATION  03/17/2003  . CHOLECYSTECTOMY N/A 05/07/2016   Procedure: LAPAROSCOPIC CHOLECYSTECTOMY;  Surgeon: Fanny Skates, MD;  Location: [facility];  Service: General;  Laterality: N/A;  . COLON SURGERY  03/2007   Resection of cecum, appendix, terminal ileum (approximately/notes 10/10/2010  . CYSTOSCOPY/URETEROSCOPY/HOLMIUM LASER/STENT PLACEMENT Right 07/03/2020   Procedure: CYSTOSCOPY/RETROGRADE/URETEROSCOPY REMOVAL OF BLADDER STONE;  Surgeon: Raynelle Bring, MD;  Location: [facility];  Service: Urology;  Laterality: Right;  . HERNIA REPAIR    . LAMINECTOMY WITH POSTERIOR LATERAL ARTHRODESIS LEVEL 1 N/A 09/15/2020   Procedure: REVISION OF THORACOLUMBAR FUSION, ADDITION OF CROSS-CONNECTORS;  Surgeon: Eustace Moore, MD;  Location: [facility];  Service: Neurosurgery;  Laterality: N/A;  . LAPAROSCOPIC CHOLECYSTECTOMY  05/07/2016  . LAPAROSCOPIC LYSIS OF ADHESIONS  05/07/2016  . LAPAROSCOPIC LYSIS OF ADHESIONS N/A 05/07/2016   Procedure: LAPAROSCOPIC LYSIS OF ADHESIONS TIMES ONE HOUR;  Surgeon: Fanny Skates, MD;  Location: [facility];  Service: General;  Laterality: N/A;  . POSTERIOR FUSION THORACIC SPINE  02/08/2016   1. Posterior thoracic arthrodesis T7-T11 utilizing morcellized allograft, 2. Posterior thoracic segmental fixation T7-T11 utilizing nuvasive pedicle screws  . PROSTATECTOMY  06/2001   w/bilateral pelvic lymph nose dissection/notes 10/24/2010  . SPINAL FUSION  12/2014   Open reduction internal fixation of L1 Chance fracture with posterior fusion T10-L4 utilizing morcellized allograft and some local autograft, segmental instrumentation T10-L4 inclusive utilizing nuvasive pedicle screws/notes 12/16/2014  . Stress Cardiolite  02/17/2003  . THOROCOTOMY WITH LOBECTOMY  03/16/2015   Procedure: THOROCOTOMY WITH LOBECTOMY;  Surgeon: Ivin Poot, MD;  Location: [facility];  Service: Thoracic;;  . TONSILLECTOMY    . TOTAL THYROIDECTOMY  1997  .  Venous Doppler  05/30/2004  . VENTRAL HERNIA REPAIR  04/14/2008  . VIDEO ASSISTED THORACOSCOPY Left 03/16/2015   Procedure: VIDEO ASSISTED THORACOSCOPY;  Surgeon: Ivin Poot, MD;  Location: [facility];  Service: Thoracic;  Laterality: Left;    Prior to Admission medications   Medication Sig Start Date End Date Taking? Authorizing Provider  albuterol (VENTOLIN HFA) 108 (90 Base) MCG/ACT inhaler Inhale 2 puffs into the lungs every 6 (six) hours as needed for wheezing or shortness of breath. 08/31/20  Yes Brand Males, MD  ALPRAZolam Duanne Moron) 0.5 MG tablet Take 0.5 mg by mouth 2 (two) times daily as needed for anxiety. 02/29/20  Yes [provider]  amoxicillin-clavulanate (AUGMENTIN) 875-125 MG tablet Take 1 tablet by mouth every 12 (twelve) hours. 09/08/20  Yes [provider]  apixaban (ELIQUIS) 5 MG TABS tablet Take 5 mg by mouth 2 (two) times daily.   Yes [provider]  atenolol (TENORMIN) 25 MG tablet Take 1 tablet (25 mg total) by mouth 2 (two) times daily. 02/09/20  Yes Candiss Norse,  Margaree Mackintosh, MD  busPIRone (BUSPAR) 15 MG tablet Take 15 mg by mouth 2 (two) times daily.  05/17/16  Yes [provider]  calcium-vitamin D (OSCAL WITH D) 500-200 MG-UNIT tablet Take 2 tablets by mouth daily.    Yes [provider]  hydrochlorothiazide (HYDRODIURIL) 12.5 MG tablet Take 12.5 mg by mouth every morning. 06/06/20  Yes [provider]  HYDROcodone-acetaminophen (NORCO/VICODIN) 5-325 MG tablet Take 1 tablet by mouth every 6 (six) hours as needed (pain). 09/08/20  Yes [provider]  insulin lispro (HUMALOG KWIKPEN) 100 UNIT/ML KiwkPen INJECT UNDER THE SKIN THREE TIMES DAILY( 25 UNITS, 25 UNITS AND 20 UNITS RESPECTIVELY BEFORE MEALS) Patient taking differently: Inject 20-30 Units into the skin See admin instructions. Inject 25 units subcutaneously before breakfast, 20 units before lunch and 30 units before supper 05/17/16  Yes Weber, Damaris Hippo, PA-C   levothyroxine (SYNTHROID) 125 MCG tablet Take 1 tablet (125 mcg total) by mouth daily before breakfast. 02/10/20  Yes Thurnell Lose, MD  Mesalamine 800 MG TBEC Take 1,600 mg by mouth 2 (two) times daily.   Yes [provider]  methocarbamol (ROBAXIN) 500 MG tablet Take 500 mg by mouth every 8 (eight) hours. 09/08/20  Yes [provider]  Multiple Vitamins-Iron (DAILY-VITE/IRON/BETA-CAROTENE) TABS Take 1 tablet by mouth daily.    Yes [provider]  Tiotropium Bromide Monohydrate (SPIRIVA RESPIMAT) 2.5 MCG/ACT AERS Inhale 2 puffs into the lungs daily. 08/31/20  Yes Brand Males, MD  zinc gluconate 50 MG tablet Take 50 mg by mouth daily.   Yes [provider]  B-D ULTRAFINE III SHORT PEN 31G X 8 MM MISC USE 1 PEN NEEDLE FOUR TIMES DAILY. 09/18/16   Ivar Drape D, PA  diltiazem (CARDIZEM) 30 MG tablet Take 1 tablet every 4 hours AS NEEDED for heart rate >100 as long as blood pressure >100. Patient not taking: No sig reported 03/30/20   Fenton, Clint R, PA  Glucose Blood (ASCENSIA CONTOUR TEST VI) Use as directed to test two times a day    [provider]  glucose blood (BAYER CONTOUR TEST) test strip TEST BLOOD SUGAR TWICE DAILY AS DIRECTED. 05/17/16   Gale Journey, Britnay Magnussen L, PA-C  glucose blood test strip 1 each by Other route 2 times daily. 05/17/16   [provider]  Insulin Pen Needle 31G X 8 MM MISC Inject 31 g into the skin. 09/18/16   [provider]  leuprolide (LUPRON) 11.25 MG injection Inject 11.25 mg into the muscle every 6 (six) months.     [provider]    Scheduled Meds: . acetaminophen (TYLENOL) oral liquid 160 mg/5 mL  500 mg Per Tube Q6H  . atenolol  25 mg Per NG tube BID  . bethanechol  5 mg Per NG tube QID  . budesonide (PULMICORT) nebulizer solution  0.5 mg Nebulization BID  . busPIRone  15 mg Per Tube BID  . chlorhexidine gluconate (MEDLINE KIT)  15 mL Mouth Rinse BID  . Chlorhexidine Gluconate Cloth   6 each Topical Q0600  . diltiazem  30 mg Per Tube Q6H  . fiber  1 packet Per Tube BID  . free water  100 mL Per Tube Q6H  . Gerhardt's butt cream   Topical BID  . heparin injection (subcutaneous)  5,000 Units Subcutaneous Q8H  . hydrochlorothiazide  12.5 mg Per Tube Daily  . insulin aspart  0-20 Units Subcutaneous Q4H  . insulin glargine  10 Units Subcutaneous BID  .  levothyroxine  125 mcg Per Tube QAC breakfast  . mouth rinse  15 mL Mouth Rinse 10 times per day  . pantoprazole sodium  40 mg Per Tube QHS   Infusions: . sodium chloride    . cefTRIAXone (ROCEPHIN)  IV 200 mL/hr at 09/20/20 1100  . feeding supplement (VITAL AF 1.2 CAL) 1,000 mL (09/20/20 1119)  . lactated ringers 75 mL/hr at 09/20/20 1129  . phenylephrine (NEO-SYNEPHRINE) Adult infusion Stopped (09/19/20 1104)   PRN Meds: sodium chloride, acetaminophen **OR** acetaminophen, ALPRAZolam, fentaNYL (SUBLIMAZE) injection, fentaNYL (SUBLIMAZE) injection, midazolam, midazolam   Allergies as of 09/10/2020  . (No Known Allergies)    Family History  Problem Relation Age of Onset  . CAD Mother   . Cancer Neg Hx        No cancer in the patient's immediate family, except of course for the patient himself, as noted    Social History   Socioeconomic History  . Marital status: Married    Spouse name: Not on file  . Number of children: Not on file  . Years of education: Not on file  . Highest education level: Not on file  Occupational History  . Occupation: Retired  Tobacco Use  . Smoking status: Former Smoker    Packs/day: 2.00    Years: 30.00    Pack years: 60.00    Types: Cigarettes    Quit date: 12/26/1989    Years since quitting: 30.7  . Smokeless tobacco: Never Used  Vaping Use  . Vaping Use: Never used  Substance and Sexual Activity  . Alcohol use: Not Currently    Alcohol/week: 0.0 standard drinks  . Drug use: No  . Sexual activity: Never  Other Topics Concern  . Not on file  Social History  Narrative   Retired Development worker, international aid   Social Determinants of Radio broadcast assistant Strain: Not on [employer] Insecurity: Not on file  Transportation Needs: Not on file  Physical Activity: Not on file  Stress: Not on file  Social Connections: Not on file  Intimate Partner Violence: Not on file    REVIEW OF SYSTEMS: Constitutional: Weakness. ENT:  No nose bleeds Pulm: Denies current shortness of breath with trach and vent in place. CV:  No palpitations, no LE edema.  GU:  No hematuria, no frequency GI:   See HPI. Heme: No unusual or excessive bleeding or bruising. Transfusions: None during this admission Neuro:  No headaches, no peripheral tingling or numbness Derm:  No itching, no rash or sores.  Endocrine:  No sweats or chills.  No polyuria or dysuria Immunization: Reviewed.  He is received Materna COVID-19 vaccines on 3 occasions.    PHYSICAL EXAM: Vital signs in last 24 hours: Vitals:   09/20/20 1033 09/20/20 1100  BP: (!) 121/45 121/72  Pulse: 83 92  Resp: (!) 26 (!) 32  Temp:    SpO2:  99%   Wt Readings from Last 3 Encounters:  09/18/20 94 kg  08/31/20 83.4 kg  08/01/20 80.7 kg    General: Ill-appearing, aged, comfortable, alert.  Not able to verbalize due to the trach but can handwrite answers Head: No facial asymmetry or swelling.  No signs of head trauma. Eyes: No scleral icterus or conjunctival pallor.  EOMI Ears: Not hard of hearing Nose: fine gauge FT in place.  No congestion or discharge Mouth: Mucosa moist, pink, clear.  Tongue midline. Neck: Trach in place.   Scant, bloody mucoid secretions at the trach site. Lungs:  Nonlabored breathing on the vent. Heart: RRR.  No MRG.  S1, S2 present Abdomen: Soft without tenderness.  No distention.  No HSM, masses, bruits, hernias.   Rectal: Deferred.  The Flexi-Seal contains liquid, yellowish/light brown stool. Musc/Skeltl: No obvious joint redness, swelling or deformities. Extremities: 1+ pitting edema on  the right foot, none on the left. Neurologic: Alert.  Appropriate.  Moves all 4 limbs.  Strength not tested. Skin: No rash, no sores, no telangiectasia Nodes: No cervical adenopathy Psych: Cooperative, pleasant, calm.  Intake/Output from previous day: 04/12 0701 - 04/13 0700 In: 3008.1 [I.V.:1722.2; NG/GT:1236; IV Piggyback:50] Out: 1500 [Urine:1250; Stool:250] Intake/Output this shift: Total I/O In: 685.6 [I.V.:284.2; NG/GT:360; IV Piggyback:41.4] Out: -   LAB RESULTS: Recent Labs    09/18/20 0532 09/19/20 0420 09/20/20 0453  WBC 17.0* 18.6* 15.2*  HGB 9.8* 10.9* 10.3*  HCT 31.4* 34.2* 32.8*  PLT 336 423* 264   BMET Lab Results  Component Value Date   NA 137 09/20/2020   NA 137 09/19/2020   NA 137 09/18/2020   K 4.2 09/20/2020   K 3.5 09/19/2020   K 4.0 09/18/2020   CL 106 09/20/2020   CL 104 09/19/2020   CL 106 09/18/2020   CO2 25 09/20/2020   CO2 27 09/19/2020   CO2 28 09/18/2020   GLUCOSE 156 (H) 09/20/2020   GLUCOSE 86 09/19/2020   GLUCOSE 163 (H) 09/18/2020   BUN 26 (H) 09/20/2020   BUN 31 (H) 09/19/2020   BUN 37 (H) 09/18/2020   CREATININE 0.99 09/20/2020   CREATININE 1.14 09/19/2020   CREATININE 1.24 09/18/2020   CALCIUM 7.9 (L) 09/20/2020   CALCIUM 8.3 (L) 09/19/2020   CALCIUM 8.2 (L) 09/18/2020   LFT No results for input(s): PROT, ALBUMIN, AST, ALT, ALKPHOS, BILITOT, BILIDIR, IBILI in the last 72 hours. PT/INR Lab Results  Component Value Date   INR 1.1 09/15/2020   INR 1.01 02/07/2016   INR 1.07 03/14/2015   Hepatitis Panel No results for input(s): HEPBSAG, HCVAB, HEPAIGM, HEPBIGM in the last 72 hours. C-Diff No components found for: CDIFF Lipase     Component Value Date/Time   LIPASE 21 09/10/2020 1031    Drugs of Abuse  No results found for: LABOPIA, COCAINSCRNUR, LABBENZ, AMPHETMU, THCU, LABBARB   RADIOLOGY STUDIES: DG Chest Port 1 View  Result Date: 09/19/2020 CLINICAL DATA:  Post tracheostomy EXAM: PORTABLE CHEST 1 VIEW  COMPARISON:  Film from earlier in the same day FINDINGS: Feeding catheter is again identified extending into the stomach. Endotracheal tube is been removed and new tracheostomy tube is noted in satisfactory position. Bibasilar atelectatic changes are again seen and stable. No pneumothorax is noted. Postsurgical changes the thoracolumbar spine are noted. IMPRESSION: Status post tracheostomy placement. The remainder of the exam is stable from the prior study. Electronically Signed   By: Inez Catalina M.D.   On: 09/19/2020 15:21   DG Chest Port 1 View  Result Date: 09/19/2020 CLINICAL DATA:  Acute respiratory failure. EXAM: PORTABLE CHEST 1 VIEW COMPARISON:  09/18/2020 FINDINGS: Endotracheal tube is 3.2 cm above the carina. Feeding tube extends into the abdomen but the tip is beyond the image. Persistent patchy densities in the lower lungs. Heart size is grossly stable but obscured by the lung densities. Negative for pneumothorax. Scattered surgical clips in the chest. Extensive spinal hardware involving the thoracic and lumbar spine. IMPRESSION: 1. No significant change in the bibasilar lung densities. Findings could represent atelectasis and/or infection with pleural effusions. 2. Endotracheal tube  is appropriately positioned. Electronically Signed   By: Markus Daft M.D.   On: 09/19/2020 08:08      IMPRESSION:   *    Chronic diarrhea.  Current consistency of stools is within the patient's norm but not clear if the volume is increased from normal.  Patient cannot tell since the Flexi-Seal is in place. Crohn's ileocolitis.  Terminal ileal, cecal resection with appendectomy in 2008.   Fortunately the diarrhea is not currently associated with any significant electrolyte abnormalities.  AKI overall improved in recent days.  *   Status post surgical stabilization of T11/T12 fracture.  *    Aspiration pneumonia with acute respiratory failure in setting of restrictive lung disease, COPD.  Growing Klebsiella.   Remote resection of lung cancer.    *   A fib.  Chronic Eliquis.  SQ Heparin in place.     PLAN:     *   Check stool for C diff, stool path study.  If these are negative, ? Add imodium?  *  Ideally restart Asacol but pt will be NPO for at least another week and all forms of po Mesalamine can not be crushed to allow for administration via feeding tube     Azucena Freed  09/20/2020, 11:36 AM Phone [phone]

## 2020-09-20 NOTE — Progress Notes (Signed)
Pharmacy Antibiotic Note  Devin [name] is a 80 y.o. male admitted on 09/10/2020 with klebsiella pneumonia.  Pharmacy has been consulted for Ceftriaxone dosing.  Plan: -Start ceftriaxone 2 gm IV Q 24 hours x 5 days  -Pharmacy to sign off as no further dosage adjustments necessary   Height: 5' 9.02" (175.3 cm) Weight: 94 kg (207 lb 3.7 oz) IBW/kg (Calculated) : 70.74  Temp (24hrs), Avg:98.9 F (37.2 C), Min:98.6 F (37 C), Max:99.7 F (37.6 C)  Recent Labs  Lab 09/16/20 0253 09/17/20 0415 09/18/20 0532 09/19/20 0420 09/20/20 0453  WBC 17.3* 14.6* 17.0* 18.6* 15.2*  CREATININE 1.22 1.44* 1.24 1.14 0.99    Estimated Creatinine Clearance: 70.7 mL/min (by C-G formula based on SCr of 0.99 mg/dL).    No Known Allergies  4/3 BCx; NegF 4/3 MRSA PCR: neg 4/3 Resp panel: COVID neg, Influenza A/B neg 4/11 TA >> abundant klebsiella (R only to amp)   Thank you for allowing pharmacy to be a part of this patient's care.  Albertina Parr, PharmD., BCPS, BCCCP Clinical Pharmacist Please refer to [facility] for unit-specific pharmacist

## 2020-09-20 NOTE — Evaluation (Addendum)
Physical Therapy Re-Evaluation Patient Details Name: Devin [name] MRN: [id number] DOB: 1940/10/29 Today's Date: 09/20/2020   History of Present Illness  80 yo male who presented 4/3 s/p fell at a bookstore 3/31 and obtained T11 chance fracture, stable L1 compression fracture, extensive lumbar spondylosis and postsurgical changes. Additionally had left 7th rib fx anteriorly and left pleural effusion. Pt intubated in ER on 4/3 for agitation and hypoxemia. 4/8 s/p rod connection for T11 fx. 4/9 failed attempt extubation.  4/12 trach placement intubated 4/3  PMH restrictive lung disease, emphysema/COPD adenocarcinoma s/p LUL lobectomy Crohn's, Psoriasis, Afib, nephrolithiasis, HTN, DM, obesity, thyroid cancer s/p RAI and total thyroidectomy (1999), and prostate cancer s/p prostatectomy (2012) but with recurrence    Clinical Impression  Pt presents with condition above and deficits mentioned below, see PT Problem List. PTA, he was independent at home. Pt s/p fall with spinal fx with respiratory failure this admission. Pt currently on CPAP vent trach support. Pt requires +2 mod (A) for bed mobility, transfers, and several side steps to the R with bil HHA with line management being part of this transfer. Pt demonstrates deficits in strength, balance, coordination, ROM, and activity tolerance that place him at risk for falls and functioning at a significantly lower level than his PLOF. Pt would benefit from intensive therapy in the CIR setting to maximize his independence and safety with all functional mobility. Will continue to follow acutely.   Follow Up Recommendations CIR    Equipment Recommendations  3in1 (PT);Rolling walker with 5" wheels    Recommendations for Other Services Rehab consult     Precautions / Restrictions Precautions Precautions: Fall;Other (comment);Back Precaution Booklet Issued: No Precaution Comments: rectal tube, trach on vent, foley Required Braces or Orthoses: Spinal  Brace Spinal Brace: Thoracolumbosacral orthotic;Applied in sitting position (broken this session and calling ortho tech for new hanger brace) Restrictions Weight Bearing Restrictions: No      Mobility  Bed Mobility Overal bed mobility: Needs Assistance Bed Mobility: Rolling;Sidelying to Sit;Supine to Sit Rolling: +2 for physical assistance;Mod assist   Supine to sit: +2 for physical assistance;Mod assist;HOB elevated Sit to supine: +2 for physical assistance;Mod assist   General bed mobility comments: pt requires one person to manage trach lines due to pt popping loose and trunk support. Pt requires (A) to elevate trunk off bed surface. pt able to initiate sequence. pt able to tolerate static sitting min guard. total (A) for brace    Transfers Overall transfer level: Needs assistance Equipment used: 2 person hand held assist Transfers: Sit to/from Stand Sit to Stand: +2 physical assistance;Mod assist;From elevated surface         General transfer comment: pt completed sit<>stand x3 this session. pt taking a few steps to R toward HOB. pt requires weight shift to initiate  Ambulation/Gait Ambulation/Gait assistance: Mod assist;+2 physical assistance;+2 safety/equipment Gait Distance (Feet): 2 Feet Assistive device: 2 person hand held assist Gait Pattern/deviations: Decreased step length - left;Decreased step length - right;Decreased stride length;Decreased weight shift to right;Decreased weight shift to left;Narrow base of support Gait velocity: reduced Gait velocity interpretation: <1.31 ft/sec, indicative of household ambulator General Gait Details: Pt with flexed posture, needing intermittent tactile cues to extend knee and hips/trunk for improved posture, but difficulty maintaining due to fatigue. Bil HHA modAx2 to stabilize pt and facilitate weight shift and leg advancement taking small side steps to R to [facility].  Stairs            Emergency planning/management officer  Modified  Rankin (Stroke Patients Only)       Balance Overall balance assessment: Needs assistance Sitting-balance support: Bilateral upper extremity supported;Feet supported Sitting balance-Leahy Scale: Good     Standing balance support: Bilateral upper extremity supported;During functional activity Standing balance-Leahy Scale: Fair                               Pertinent Vitals/Pain Pain Assessment: Faces Faces Pain Scale: Hurts a little bit Pain Location: back Pain Descriptors / Indicators: Operative site guarding Pain Intervention(s): Limited activity within patient's tolerance;Monitored during session;Repositioned    Home Living Family/patient expects to be discharged to:: Inpatient rehab                 Additional Comments: daughter present at time of this eval.    Prior Function Level of Independence: Independent               Hand Dominance   Dominant Hand: Right    Extremity/Trunk Assessment   Upper Extremity Assessment Upper Extremity Assessment: Defer to OT evaluation RUE Deficits / Details: edema noted, skin tear noted on the medial side of the inner arm. RUE Coordination: decreased fine motor LUE Deficits / Details: noted to have edema. pt with pocket of fluid under blood pressure cuff above elbow    Lower Extremity Assessment Lower Extremity Assessment: Generalized weakness RLE Deficits / Details: Difficulty obtaining full AROM knee extension against gravity or obtaining full hip and knee extension in standing without physical assistance RLE Coordination: decreased gross motor LLE Deficits / Details: Difficulty obtaining full AROM knee extension against gravity or obtaining full hip and knee extension in standing without physical assistance LLE Coordination: decreased gross motor    Cervical / Trunk Assessment Cervical / Trunk Assessment: Other exceptions Cervical / Trunk Exceptions: s/p spinal surg-- dress irriating skin so RN  applying non stick bandage this session with paper tape  Communication   Communication: Tracheostomy  Cognition Arousal/Alertness: Awake/alert Behavior During Therapy: WFL for tasks assessed/performed Overall Cognitive Status: Difficult to assess                                 General Comments: following 1 step commands, appears aware of daughter, responding with faces to indicate understanding. declines to write and attempting to mouth words.      General Comments General comments (skin integrity, edema, etc.): FIO 30% CPAP peep 5- rescue breaths given x3 during session. RT increased values prior to eob to help with sitting tolerance    Exercises General Exercises - Lower Extremity Long Arc Quad: Both;5 reps;Seated   Assessment/Plan    PT Assessment Patient needs continued PT services  PT Problem List Decreased strength;Decreased activity tolerance;Decreased balance;Decreased mobility;Pain;Decreased range of motion;Decreased coordination;Decreased knowledge of use of DME;Decreased safety awareness;Decreased knowledge of precautions;Decreased skin integrity       PT Treatment Interventions Gait training;DME instruction;Functional mobility training;Therapeutic activities;Therapeutic exercise;Balance training;Patient/family education;Stair training;Neuromuscular re-education    PT Goals (Current goals can be found in the Care Plan section)  Acute Rehab PT Goals Patient Stated Goal: unable to state PT Goal Formulation: With patient/family Time For Goal Achievement: 10/04/20 Potential to Achieve Goals: Good    Frequency Min 5X/week   Barriers to discharge        Co-evaluation PT/OT/SLP Co-Evaluation/Treatment: Yes Reason for Co-Treatment: Necessary to address cognition/behavior during functional activity;For patient/therapist safety;To address  functional/ADL transfers PT goals addressed during session: Mobility/safety with mobility;Balance OT goals addressed  during session: ADL's and self-care       AM-PAC PT "6 Clicks" Mobility  Outcome Measure Help needed turning from your back to your side while in a flat bed without using bedrails?: Total Help needed moving from lying on your back to sitting on the side of a flat bed without using bedrails?: Total Help needed moving to and from a bed to a chair (including a wheelchair)?: Total Help needed standing up from a chair using your arms (e.g., wheelchair or bedside chair)?: Total Help needed to walk in hospital room?: Total Help needed climbing 3-5 steps with a railing? : Total 6 Click Score: 6    End of Session Equipment Utilized During Treatment: Oxygen;Back brace;Gait belt Activity Tolerance: Patient tolerated treatment well;Patient limited by fatigue Patient left: in bed;with call bell/phone within reach;with bed alarm set;with family/visitor present (HOB elevated) Nurse Communication: Mobility status PT Visit Diagnosis: Pain;Difficulty in walking, not elsewhere classified (R26.2);Unsteadiness on feet (R26.81);Other abnormalities of gait and mobility (R26.89);Muscle weakness (generalized) (M62.81) Pain - part of body:  (back)    Time: 2947-6546 PT Time Calculation (min) (ACUTE ONLY): 41 min   Charges:   PT Evaluation $PT Re-evaluation: 1 Re-eval          Moishe Spice, PT, DPT Acute Rehabilitation Services  Pager: [phone] Office: [place] 09/20/2020, 2:28 PM

## 2020-09-20 NOTE — Progress Notes (Addendum)
NAME:  Devin [name], MRN:  [id number], DOB:  Feb 15, 1941, LOS: 62 ADMISSION DATE:  09/10/2020, CONSULTATION DATE:  09/12/20 REFERRING MD:  Triad, CHIEF COMPLAINT:  T11-12 Chance Fracture c/b acute on chronic hypercapnic RF   History of Present Illness:  Devin [name] is a 80 y.o. male here for medical stabilization prior to pursuing spine stabilization neurosurgery (connecting prior T7-11 and T11-L4 rods) following a fall with a relevant PMHx of restrictive lung disease, emphysema/COPD, adenocarcinoma s/p LUL lobectomy (2016), ankylosing spondylitis, Crohn's, Afib, nephrolithiasis, HTN, DM, obesity, and thyroid and prostate cancer (see below). Per chart review, patient fell at a bookstore 3/31. New onset back pain - denied head trauma or LOC at that time. Saw PCP on 4/1 and underwent thoracic, lumbar, and chest x-ray. Found to have new T11 Chance fracture.He also has a stable L1 compression fracture, extensive lumbar spondylosis and postsurgical changes. Additionally had a left seventh rib fracture anteriorly and a left pleural effusion without any obvious pneumothorax. Patient was intubated in the ER on 4/3 for agitation and hypoxemia. Was transferred to [facility] for cardiopulmonary stabilization and eventual neurosurgical intervention. Neurosurgery intervention planned for today, 09/15/20.   Pertinent  Medical History  Restrictive lung disease (likely 2/2 prior opioid use for pain management and posture), emphysema, COPD, adenocarcinoma s/p LUL lobectomy (2016), Ankylosing spondylitis, Crohn's, Psoriasis, Afib, nephrolithiasis, HTN, DM, obesity, thyroid cancer s/p RAI and total thyroidectomy (1999), and prostate cancer s/p prostatectomy (2012) but with recurrence.      Significant Hospital Events: Including procedures, antibiotic start and stop dates in addition to other pertinent events    4/3 - Intubated in ER for agitation   4/3 - Resp viral panel PCR Neg  4/3 - Respiratory culture  from ET sent  4/3 - Blood Cx, negative at 48 hrs   4/6 - Min Vent requirements, HDS  4/8 - Neurosurgery plans to link prior rod systems across T11 Fx  4/9- failed attempt extubation. Decadron X 4 doses for airway edema   4/10 IVFs increased for mild Cr bump  4/11 cr improved w/ IVFs.  passed SBT. Had ETT cuff leak so extubated. Failed extubation 2/2 progressive weakness, worse cough mechanics (essentially just fatigued and progressed to respiratory failure) re-intubated. Sputum sent post re-intubation   4/12 trach at bedside by Kary Kos and Agarwala. Sputum sent. Having bursts of AF  4/13 sputum growing Klebsiella. Low gd temp/ongoing leukocytosis so Ceftriaxone started. GI consulted for diarrhea in context of known chron's   Interim History / Subjective:  Resting comfortably on PSV Objective   Blood pressure 133/66, pulse 74, temperature 99.7 F (37.6 C), temperature source Axillary, resp. rate 16, height 5' 9.02" (1.753 m), weight 94 kg, SpO2 100 %.    Vent Mode: PRVC FiO2 (%):  [30 %] 30 % Set Rate:  [16 bmp] 16 bmp Vt Set:  [560 mL] 560 mL PEEP:  [5 cmH20] 5 cmH20 Plateau Pressure:  [18 cmH20-22 cmH20] 18 cmH20   Intake/Output Summary (Last 24 hours) at 09/20/2020 0804 Last data filed at 09/20/2020 0700 Gross per 24 hour  Intake 2922.79 ml  Output 1275 ml  Net 1647.79 ml   Filed Weights   09/16/20 0400 09/17/20 0400 09/18/20 0500  Weight: 94.1 kg 92.5 kg 94 kg    Examination: General: 80 year old white male currently resting on pressure support ventilation HEENT normocephalic atraumatic tracheostomy site is unremarkable he currently has size 6 cuffed tracheostomy no air leak noted minimal secretions Pulmonary: Coarse scattered rhonchi no  accessory use currently on pressure support of 10 with excellent tidal volumes and no accessory use on the setting Cardiac: Regular rate and rhythm without murmur rub or gallop Abdomen: Soft nontender no organomegaly Extremities:  Warm dry brisk cap refill bilateral dependent edema Neuro: Awake oriented and follows commands generalized weakness bilaterally  Labs/imaging that I havepersonally reviewed  (right click and "Reselect all SmartList Selections" daily)  Reviewed  Resolved Hospital Problem list   N/A  Assessment & Plan:   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  -pcxr post trach showed patchy bilateral atelectasis/airspace disease  Tmax 99.7; WBC elevated but improved from 4/12 Plan Cont routine trach care Daily assessment for SBT and PSV For now mandatory rest on full support at night (re-assess this need daily based on endurance) VAP bundle Am cxr  Aspiration PNA ->growing Klebsiella Plan Add ceftriaxone X 5 days  Has h/o afib & HTN>had AF w/ RVR but now NSR Was on DOAC Plan Cont tele  Add atenolol back and scheduled dilt (took this PRN at home) Add back HCTZ Starting Converse heparin, will message surg about timing of resuming DOAC  Status post stabilization Chance fracture T11-T12 Plan Post-op care per neuro  Will need extensive PT and OT   Remote history of lung cancer status post lung resection of left upper lobe.  Has known nodule left lower lobe since 2021 which has been under observation Plan Outpt CT and Onc follow up   Generalized frailty with recent 40 pound weight loss over the last 6 to 12 months.  Certainly raising consideration for return to malignancy Plan OP follow up   Mild hyperglycemia Plan ssi and basal coverage (goal 140-180) Will inc lantus to 10 bid  Hypothyroidism Plan Synthroid   Diarrhea Plan Monitor  Added fiber Will ask GI to see w/ h/o chron's  disease worth while steroid pulse?  Was on mesalamine at home  Urinary retention w/ difficult foley insertion requiring coude cath Plan Starting urecholine at 86m qid can titrate if needed   Best Practice (right click and "Reselect all SmartList Selections" daily)   Diet:  Tube Feed   If oral type of diet:  Pain/Anxiety/Delirium protocol (if indicated): Yes (RASS goal 0) VAP protocol (if indicated): Yes DVT prophylaxis: Heparin GI prophylaxis: PPI Glucose control:  SSI Yes and Basal insulin Yes Central venous access:  Yes, and it is no longer needed Arterial line:  N/A Foley:  Yes, and it is no longer needed Mobility:  OOB  PT consulted Studies pending: none Culture data pending: none Last reviewed culture data:today Antibiotics:NA  Antibiotic de-escalation: Not indicated Stop date: does not have Planned stop date (if determined)NA Daily labs: requested Code Status:  full code Prognosis: Life-threating Last date of multidisciplinary goals of care discussion pending  Disposition: Continue present management  My cct 45 minutes this included: 1/3 time chart review, 1/3 face to face and 1/3 time dictating plan of care  PErick ColaceACNP-BC LJagualPager # [phone] # [phone] no answer

## 2020-09-20 NOTE — Plan of Care (Signed)
  Problem: Clinical Measurements: Goal: Respiratory complications will improve Outcome: Progressing   Problem: Activity: Goal: Risk for activity intolerance will decrease Outcome: Progressing   Problem: Nutrition: Goal: Adequate nutrition will be maintained Outcome: Progressing   Problem: Activity: Goal: Ability to tolerate increased activity will improve Outcome: Progressing

## 2020-09-20 NOTE — Evaluation (Signed)
Occupational Therapy Evaluation Patient Details Name: Devin [name] MRN: [id number] DOB: [birth year] Today's Date: 09/20/2020    History of Present Illness 80 yo male s/p fell at a bookstore 3/31 and obtained T11 chance fracture, stable L1 compression fracture, extensive lumbar spondylosis and postsurgical changes. Additionally had left 7th rib fx anteriorly and left pleural effusion. Pt intubated in ER on 4/3 for agitation and hypoxemia. 4/8 s/p rod connection for T11 fx. 4/9 failed attempt extubation.  4/12 trach placement intubated 4/3  PMH restrictive lung disease, emphysema/COPD adenocarcinoma s/p LUL lobectomy Crohn's, Psoriasis, Afib, nephrolithiasis, HTN, DM, obesity, thyroid cancer s/p RAI and total thyroidectomy (1999), and prostate cancer s/p prostatectomy (2012) but with recurrence   Clinical Impression   Patient is s/p T11 fixation rod surgery resulting in functional limitations due to the deficits listed below (see OT problem list). Pt prior to admission was indep at home. Pt s/p fall with spinal fx with respiratory failure this admission. Pt currently on CPAP vent trach support. Pt requires total +2 mod (A) for bed mobility with line management being part of this transfer.  Patient will benefit from skilled OT acutely to increase independence and safety with ADLS to allow discharge CIR.     Follow Up Recommendations  CIR    Equipment Recommendations  3 in 1 bedside commode;Other (comment) (RW)    Recommendations for Other Services Rehab consult (pending d/c from vent)     Precautions / Restrictions Precautions Precautions: Fall;Other (comment);Back Precaution Comments: rectal tube, trach, foley, Required Braces or Orthoses: Spinal Brace Spinal Brace: Thoracolumbosacral orthotic;Applied in sitting position (broken this session and calling ortho tech for new hanger brace)      Mobility Bed Mobility Overal bed mobility: Needs Assistance Bed Mobility: Rolling;Supine to  Sit;Sit to Supine Rolling: +2 for physical assistance;Mod assist   Supine to sit: +2 for physical assistance;Mod assist;HOB elevated Sit to supine: +2 for physical assistance;Mod assist   General bed mobility comments: pt requires one person to manage trach lines due to pt popping loose and trunk support. Pt requires (A) to elevate trunk off bed surface. pt able to initiate sequence. pt able to tolerate static sitting min guard. total (A) for brace    Transfers Overall transfer level: Needs assistance Equipment used: 2 person hand held assist Transfers: Sit to/from Stand Sit to Stand: +2 physical assistance;Mod assist;From elevated surface         General transfer comment: pt completed sit<>stand x3 this session. pt taking a few steps toward HOB. pt requires weight shift to initiate    Balance Overall balance assessment: Needs assistance Sitting-balance support: Bilateral upper extremity supported;Feet supported Sitting balance-Leahy Scale: Good     Standing balance support: Bilateral upper extremity supported;During functional activity Standing balance-Leahy Scale: Fair                             ADL either performed or assessed with clinical judgement   ADL Overall ADL's : Needs assistance/impaired Eating/Feeding: NPO   Grooming: Maximal assistance;Sitting           Upper Body Dressing : Total assistance Upper Body Dressing Details (indicate cue type and reason): total (A) sitting eob adjustment made to chest piece to allow it to hit chest below trach Lower Body Dressing: Total assistance                 General ADL Comments: pt sit<>stand at EOB and lateral movement along bed.  pt with CPAP on vent currently with elevated RR 33 so slow rest breaks provide     Vision         Perception     Praxis      Pertinent Vitals/Pain Pain Assessment: Faces Faces Pain Scale: Hurts a little bit Pain Location: back Pain Descriptors / Indicators:  Operative site guarding Pain Intervention(s): Repositioned     Hand Dominance Right   Extremity/Trunk Assessment Upper Extremity Assessment Upper Extremity Assessment: Generalized weakness;RUE deficits/detail;LUE deficits/detail RUE Deficits / Details: edema noted, skin tear noted on the medial side of the inner arm. RUE Coordination: decreased fine motor LUE Deficits / Details: noted to have edema. pt with pocket of fluid under blood pressure cuff above elbow   Lower Extremity Assessment Lower Extremity Assessment: Defer to PT evaluation   Cervical / Trunk Assessment Cervical / Trunk Assessment: Other exceptions Cervical / Trunk Exceptions: s/p spinal surg-- dress irriating skin so RN applying non stick bandage this session with paper tape   Communication Communication Communication: Tracheostomy   Cognition Arousal/Alertness: Awake/alert Behavior During Therapy: WFL for tasks assessed/performed Overall Cognitive Status: Difficult to assess                                 General Comments: following 1 step commands, appears aware of daughter, responding with faces to indicate understanding. declines to write and attempting to mouth words.   General Comments  FIO 30% CPAP peep 5- rescue breaths given x3 during session. RT increased values prior to eob to help with sitting tolerance    Exercises     Shoulder Instructions      Home Living Family/patient expects to be discharged to:: Inpatient rehab                                 Additional Comments: daughter present at time of this eval.      Prior Functioning/Environment Level of Independence: Independent                 OT Problem List: Decreased strength;Decreased activity tolerance;Impaired balance (sitting and/or standing);Decreased safety awareness;Decreased knowledge of use of DME or AE;Decreased knowledge of precautions;Obesity;Impaired UE functional use;Pain;Cardiopulmonary  status limiting activity      OT Treatment/Interventions: Self-care/ADL training;Therapeutic exercise;Neuromuscular education;Energy conservation;DME and/or AE instruction;Manual therapy;Cognitive remediation/compensation;Therapeutic activities;Patient/family education;Balance training    OT Goals(Current goals can be found in the care plan section) Acute Rehab OT Goals Patient Stated Goal: unable to state OT Goal Formulation: With patient/family Time For Goal Achievement: 10/04/20 Potential to Achieve Goals: Good  OT Frequency: Min 2X/week   Barriers to D/C:            Co-evaluation PT/OT/SLP Co-Evaluation/Treatment: Yes Reason for Co-Treatment: For patient/therapist safety;Necessary to address cognition/behavior during functional activity;To address functional/ADL transfers   OT goals addressed during session: ADL's and self-care      AM-PAC OT "6 Clicks" Daily Activity     Outcome Measure Help from another person eating meals?: A Lot Help from another person taking care of personal grooming?: A Lot Help from another person toileting, which includes using toliet, bedpan, or urinal?: A Lot Help from another person bathing (including washing, rinsing, drying)?: A Lot Help from another person to put on and taking off regular upper body clothing?: A Lot Help from another person to put on and taking off regular lower  body clothing?: A Lot 6 Click Score: 12   End of Session Equipment Utilized During Treatment: Gait belt;Oxygen Nurse Communication: Mobility status;Precautions  Activity Tolerance: Patient tolerated treatment well Patient left: in bed;with call bell/phone within reach;with bed alarm set;with family/visitor present;with nursing/sitter in room  OT Visit Diagnosis: Unsteadiness on feet (R26.81);Muscle weakness (generalized) (M62.81)                Time: 0814-4818 OT Time Calculation (min): 41 min Charges:  OT General Charges $OT Visit: 1 Visit OT Evaluation $OT  Eval Moderate Complexity: 1 Mod OT Treatments $Self Care/Home Management : 8-22 mins   Brynn, OTR/L  Acute Rehabilitation Services Pager: [phone] Office: [phone] .   Jeri Modena 09/20/2020, 1:27 PM

## 2020-09-21 ENCOUNTER — Other Ambulatory Visit: Payer: Self-pay

## 2020-09-21 ENCOUNTER — Inpatient Hospital Stay (HOSPITAL_COMMUNITY): Payer: Medicare Other

## 2020-09-21 DIAGNOSIS — K591 Functional diarrhea: Secondary | ICD-10-CM

## 2020-09-21 DIAGNOSIS — Z9911 Dependence on respirator [ventilator] status: Secondary | ICD-10-CM | POA: Diagnosis not present

## 2020-09-21 DIAGNOSIS — J9621 Acute and chronic respiratory failure with hypoxia: Secondary | ICD-10-CM | POA: Diagnosis not present

## 2020-09-21 DIAGNOSIS — J9622 Acute and chronic respiratory failure with hypercapnia: Secondary | ICD-10-CM | POA: Diagnosis not present

## 2020-09-21 LAB — GASTROINTESTINAL PANEL BY PCR, STOOL (REPLACES STOOL CULTURE)

## 2020-09-21 LAB — BASIC METABOLIC PANEL
Anion gap: 7 (ref 5–15)
BUN: 20 mg/dL (ref 8–23)
CO2: 27 mmol/L (ref 22–32)
Calcium: 7.9 mg/dL — ABNORMAL LOW (ref 8.9–10.3)
Chloride: 101 mmol/L (ref 98–111)
Creatinine, Ser: 0.95 mg/dL (ref 0.61–1.24)
GFR, Estimated: >60 mL/min (ref >60)
Glucose, Bld: 156 mg/dL — ABNORMAL HIGH (ref 70–99)
Potassium: 3.3 mmol/L — ABNORMAL LOW (ref 3.5–5.1)
Sodium: 135 mmol/L (ref 135–145)

## 2020-09-21 LAB — CBC WITH DIFFERENTIAL/PLATELET
Abs Immature Granulocytes: 0.68 10*3/uL — ABNORMAL HIGH (ref 0.00–0.07)
Basophils Absolute: 0 10*3/uL (ref 0.0–0.1)
Basophils Relative: 0 %
Eosinophils Absolute: 0.2 10*3/uL (ref 0.0–0.5)
Eosinophils Relative: 1 %
HCT: 30.2 % — ABNORMAL LOW (ref 39.0–52.0)
Hemoglobin: 9.8 g/dL — ABNORMAL LOW (ref 13.0–17.0)
Immature Granulocytes: 4 %
Lymphocytes Relative: 5 %
Lymphs Abs: 0.8 10*3/uL (ref 0.7–4.0)
MCH: 30.5 pg (ref 26.0–34.0)
MCHC: 32.5 g/dL (ref 30.0–36.0)
MCV: 94.1 fL (ref 80.0–100.0)
Monocytes Absolute: 0.9 10*3/uL (ref 0.1–1.0)
Monocytes Relative: 5 %
Neutro Abs: 14.8 10*3/uL — ABNORMAL HIGH (ref 1.7–7.7)
Neutrophils Relative %: 85 %
Platelets: 323 10*3/uL (ref 150–400)
RBC: 3.21 MIL/uL — ABNORMAL LOW (ref 4.22–5.81)
RDW: 14.2 % (ref 11.5–15.5)
WBC: 17.3 10*3/uL — ABNORMAL HIGH (ref 4.0–10.5)
nRBC: 0 % (ref 0.0–0.2)

## 2020-09-21 LAB — GLUCOSE, CAPILLARY
Glucose-Capillary: 173 mg/dL — ABNORMAL HIGH (ref 70–99)
Glucose-Capillary: 152 mg/dL — ABNORMAL HIGH (ref 70–99)
Glucose-Capillary: 122 mg/dL — ABNORMAL HIGH (ref 70–99)
Glucose-Capillary: 140 mg/dL — ABNORMAL HIGH (ref 70–99)
Glucose-Capillary: 146 mg/dL — ABNORMAL HIGH (ref 70–99)
Glucose-Capillary: 165 mg/dL — ABNORMAL HIGH (ref 70–99)

## 2020-09-21 MED ORDER — FUROSEMIDE 10 MG/ML IJ SOLN
40.0000 mg | Freq: Once | INTRAMUSCULAR | Status: AC
  Administered 2020-09-21: 40 mg via INTRAVENOUS
  Filled 2020-09-21: qty 4

## 2020-09-21 MED ORDER — LOPERAMIDE HCL 1 MG/7.5ML PO SUSP
2.0000 mg | Freq: Two times a day (BID) | ORAL | Status: DC
  Filled 2020-09-21 (×2): qty 15

## 2020-09-21 MED ORDER — LOPERAMIDE HCL 1 MG/7.5ML PO SUSP
2.0000 mg | Freq: Two times a day (BID) | ORAL | Status: DC
  Administered 2020-09-21 – 2020-09-24 (×8): 2 mg
  Filled 2020-09-21 (×9): qty 15

## 2020-09-21 MED ORDER — OSMOLITE 1.5 CAL PO LIQD
1000.0000 mL | ORAL | Status: DC
  Administered 2020-09-21 – 2020-10-04 (×11): 1000 mL
  Filled 2020-09-21 (×4): qty 1000

## 2020-09-21 MED ORDER — LOPERAMIDE HCL 1 MG/7.5ML PO SUSP
2.0000 mg | Freq: Two times a day (BID) | ORAL | Status: DC | PRN
  Administered 2020-09-22 – 2020-09-25 (×4): 2 mg
  Filled 2020-09-21 (×6): qty 15

## 2020-09-21 MED ORDER — JUVEN PO PACK
1.0000 | PACK | Freq: Two times a day (BID) | ORAL | Status: DC
  Administered 2020-09-21 – 2020-10-06 (×29): 1
  Filled 2020-09-21 (×29): qty 1

## 2020-09-21 MED ORDER — POTASSIUM CHLORIDE 20 MEQ PO PACK
40.0000 meq | PACK | ORAL | Status: AC
  Administered 2020-09-21 (×2): 40 meq
  Filled 2020-09-21 (×2): qty 2

## 2020-09-21 MED ORDER — PROSOURCE TF PO LIQD
45.0000 mL | Freq: Three times a day (TID) | ORAL | Status: DC
  Administered 2020-09-21 – 2020-10-05 (×42): 45 mL
  Filled 2020-09-21 (×41): qty 45

## 2020-09-21 MED ORDER — REVEFENACIN 175 MCG/3ML IN SOLN
175.0000 ug | Freq: Every day | RESPIRATORY_TRACT | Status: DC
  Administered 2020-09-22 – 2020-10-05 (×12): 175 ug via RESPIRATORY_TRACT
  Filled 2020-09-21 (×18): qty 3

## 2020-09-21 NOTE — Progress Notes (Signed)
Nutrition Follow-up  DOCUMENTATION CODES:   Not applicable  INTERVENTION:   D/C Vital AF   Tube feeding via Cortrak tube: Osmolite 1.5 at 55 ml/h (1320 ml per day) Prosource TF 45 ml TID  Provides 2100 kcal, 115 gm protein, 1003 ml free water daily  -1 packet Juven BID, each packet provides 95 calories, 2.5 grams of protein (collagen), and 9.8 grams of carbohydrate (3 grams sugar); also contains 7 grams of L-arginine and L-glutamine, 300 mg vitamin C, 15 mg vitamin E, 1.2 mcg vitamin B-12, 9.5 mg zinc, 200 mg calcium, and 1.5 g  Calcium Beta-hydroxy-Beta-methylbutyrate to support wound healing  100 ml free water every 6 hours Total free water: 1403 ml  NUTRITION DIAGNOSIS:   Inadequate oral intake related to inability to eat as evidenced by NPO status. Ongoing.   GOAL:   Patient will meet greater than or equal to 90% of their needs Met with TF.   MONITOR:   Vent status,TF tolerance,Labs,Weight trends  REASON FOR ASSESSMENT:   Ventilator,Consult Enteral/tube feeding initiation and management  ASSESSMENT:   80 y/o male with medical history of afib, adenocarcinoma of the L lung, ankylosing spondylitis, Crohn's disease, nephrolithiasis, HTN, DM, obesity, osteoporosis, prostate cancer, psoriasis, thyroid cancer, and recently dx COPD. He presented to the ED due to back pain. He experienced a fall on 3/31; seen by his PCP for thoracic spine xray, lumbar spine xray, and CXR of the L ribs. He has had increased back pain since the fall and having nausea, dry heaving, and no vomiting episodes. He is not on chemo at this time.  Pt discussed during ICU rounds and with RN.  Per RT on track collar trial this am  GI following and started imodium for loose stools, pt unable to receive crohn's medications per tube.   4/03 Intubated 4/09 Failed Extubation 4/11 Extubated, Re-Intubated, Cortrak placed; tip gastric    4/12 s/p trach  Medications reviewed and include: nutrisource fiber  BID, SSI, lantus, imodium  2 mg BID 40 mEq KCl every 4 hours x 3  Labs reviewed: K+3.3 CBG's: 122-173  UOP: 1050 ml  I&O: +18 L Weight increased by 11 kg since admission   Mild pitting edema noted  Current TF:  Vital AF 1.2 at 65 ml/hr Provides 1872 kcals, 117 g of protein   Diet Order:   Diet Order            Diet NPO time specified  Diet effective now                 EDUCATION NEEDS:   No education needs have been identified at this time  Skin:  Skin Assessment:  (skin tears) Skin Integrity Issues:: Stage II Stage II: sacrum, mid lower back  Last BM:  500 ml via rectal tube  Height:   Ht Readings from Last 1 Encounters:  09/10/20 5' 9.02" (1.753 m)    Weight:   Wt Readings from Last 1 Encounters:  09/21/20 94.2 kg    Ideal Body Weight:  72.73 kg  BMI:  Body mass index is 30.65 kg/m.  Estimated Nutritional Needs:   Kcal:  2000-2200  Protein:  100-125 grams  Fluid:  >/= 2 L/day  Lockie Pares., RD, LDN, CNSC See AMiON for contact information

## 2020-09-21 NOTE — Progress Notes (Addendum)
Daily Rounding Note  09/21/2020, 11:14 AM  LOS: 11 days   SUBJECTIVE:   Chief complaint: Diarrhea.  Longstanding history of Crohn's disease.     500 mL of yellow/light brown stool in previous 24 hours.  No abdominal pain.  No nausea.  Tube feeds in place.  OBJECTIVE:         Vital signs in last 24 hours:    Temp:  [98.3 F (36.8 C)-99.5 F (37.5 C)] 98.4 F (36.9 C) (04/14 0800) Pulse Rate:  [66-79] 72 (04/14 0900) Resp:  [12-31] 31 (04/14 0900) BP: (87-139)/(45-74) 123/48 (04/14 0900) SpO2:  [98 %-100 %] 100 % (04/14 0900) FiO2 (%):  [30 %] 30 % (04/14 0811) Weight:  [94.2 kg] 94.2 kg (04/14 0500) Last BM Date: 09/21/20 Filed Weights   09/17/20 0400 09/18/20 0500 09/21/20 0500  Weight: 92.5 kg 94 kg 94.2 kg   General: Looks ill, tired, weak.  Alert, comfortable. Heart: RRR.  NSR with rate in 70s on telemetry monitor. Chest: Rhonchorous upper airway sounds with lots of mucus, difficulty clearing, coughing secretions up.  Using the suction catheter for assistance with eliminating phlegm. Abdomen: Soft.  Active bowel sounds.  Nontender.  There is a 2 to 3 cm rounded, firm, nontender subcutaneous lesion on the right mid to lower abdomen just to the right of the midline scar. Extremities: Nonpitting pedal edema. Neuro/Psych: Alert, engaged.  Unable to speak with the trach in place but follows all commands, moves all his limbs.  No tremors.  Strength not tested.  Able to communicate by writing answers when necessary.  Intake/Output from previous day: 04/13 0701 - 04/14 0700 In: 3742 [I.V.:1760.9; NG/GT:1881; IV Piggyback:100.1] Out: 1550 [Urine:1050; Stool:500]  Intake/Output this shift: Total I/O In: 235.9 [I.V.:105.9; NG/GT:130] Out: -   Lab Results: Recent Labs    09/19/20 0420 09/20/20 0453 09/21/20 0232  WBC 18.6* 15.2* 17.3*  HGB 10.9* 10.3* 9.8*  HCT 34.2* 32.8* 30.2*  PLT 423* 264 323    BMET Recent Labs    09/19/20 0420 09/20/20 0453 09/21/20 0801  NA 137 137 135  K 3.5 4.2 3.3*  CL 104 106 101  CO2 27 25 27   GLUCOSE 86 156* 156*  BUN 31* 26* 20  CREATININE 1.14 0.99 0.95  CALCIUM 8.3* 7.9* 7.9*   LFT No results for input(s): PROT, ALBUMIN, AST, ALT, ALKPHOS, BILITOT, BILIDIR, IBILI in the last 72 hours. PT/INR No results for input(s): LABPROT, INR in the last 72 hours. Hepatitis Panel No results for input(s): HEPBSAG, HCVAB, HEPAIGM, HEPBIGM in the last 72 hours.  Studies/Results: DG CHEST PORT 1 VIEW  Result Date: 09/21/2020 CLINICAL DATA:  Pneumonia EXAM: PORTABLE CHEST 1 VIEW COMPARISON:  09/19/2020 FINDINGS: Nasoenteric feeding tube tip is seen in the expected distal body of the stomach. Tracheostomy unchanged. Mild elevation of the right hemidiaphragm again noted with associated right basilar atelectasis. Small partially loculated left pleural effusion again noted, unchanged. Lungs are otherwise clear. No pneumothorax. Cardiac size within normal limits. IMPRESSION: Nasoenteric feeding tube tip within the distal body of the stomach. Otherwise stable examination with bibasilar atelectasis and small partially loculated left pleural effusion. Electronically Signed   By: Fidela Salisbury MD   On: 09/21/2020 04:52   DG Chest Port 1 View  Result Date: 09/19/2020 CLINICAL DATA:  Post tracheostomy EXAM: PORTABLE CHEST 1 VIEW COMPARISON:  Film from earlier in the same day FINDINGS: Feeding catheter is again identified extending into the  stomach. Endotracheal tube is been removed and new tracheostomy tube is noted in satisfactory position. Bibasilar atelectatic changes are again seen and stable. No pneumothorax is noted. Postsurgical changes the thoracolumbar spine are noted. IMPRESSION: Status post tracheostomy placement. The remainder of the exam is stable from the prior study. Electronically Signed   By: Inez Catalina M.D.   On: 09/19/2020 15:21    ASSESMENT:   *    Diarrhea in pt w Crohn's dz and receiving TF. Chronic Asocol on hold as patient is n.p.o. and this cannot be administered via tube.  Unsuccessful attempts to administer contents of Pentasa capsule via tube. Stool C. difficile and stool path panel PCR all negative.  *   S/p surgical stabilization T11/12 fracture.  *   CAP, aspiration, Klebsiella pneumonia, ARF.  Failed extubation x2.  Status post tracheostomy.  *   A. fib.  Chronic Eliquis on hold, subcu heparin in place.  *   Normocytic anemia.  Hgb stable within a range of 9.8 to 10.3.  *   Hypothyroidism following total thyroidectomy.   Note that his TSH was 11.8, elevated, in late January 2022. PLAN   *   Initiate scheduled Imodium.  Start off conservatively with 2 mg via tube bid.   Stopped Pentasa, never received doses of this.  *   Check a TSH and free T4.      Azucena Freed  09/21/2020, 11:15 AM Phone [phone]

## 2020-09-21 NOTE — Progress Notes (Addendum)
NAME:  Devin [name], MRN:  [id number], DOB:  [birth year], LOS: 55 ADMISSION DATE:  09/10/2020, CONSULTATION DATE:  09/12/20 REFERRING MD:  Triad, CHIEF COMPLAINT:  T11-12 Chance Fracture c/b acute on chronic hypercapnic RF   History of Present Illness:  Devin [name] is a 80 y.o. male here for medical stabilization prior to pursuing spine stabilization neurosurgery (connecting prior T7-11 and T11-L4 rods) following a fall with a relevant PMHx of restrictive lung disease, emphysema/COPD, adenocarcinoma s/p LUL lobectomy (2016), ankylosing spondylitis, Crohn's, Afib, nephrolithiasis, HTN, DM, obesity, and thyroid and prostate cancer (see below). Per chart review, patient fell at a bookstore 3/31. New onset back pain - denied head trauma or LOC at that time. Saw PCP on 4/1 and underwent thoracic, lumbar, and chest x-ray. Found to have new T11 Chance fracture.He also has a stable L1 compression fracture, extensive lumbar spondylosis and postsurgical changes. Additionally had a left seventh rib fracture anteriorly and a left pleural effusion without any obvious pneumothorax. Patient was intubated in the ER on 4/3 for agitation and hypoxemia. Was transferred to [facility] for cardiopulmonary stabilization and eventual neurosurgical intervention. Neurosurgery intervention planned for today, 09/15/20.   Pertinent  Medical History  Restrictive lung disease (likely 2/2 prior opioid use for pain management and posture), emphysema, COPD, adenocarcinoma s/p LUL lobectomy (2016), Ankylosing spondylitis, Crohn's, Psoriasis, Afib, nephrolithiasis, HTN, DM, obesity, thyroid cancer s/p RAI and total thyroidectomy (1999), and prostate cancer s/p prostatectomy (2012) but with recurrence.      Significant Hospital Events: Including procedures, antibiotic start and stop dates in addition to other pertinent events    4/3 - Intubated in ER for agitation   4/3 - Resp viral panel PCR Neg  4/3 - Respiratory culture  from ET sent  4/3 - Blood Cx, negative at 48 hrs   4/6 - Min Vent requirements, HDS  4/8 - Neurosurgery plans to link prior rod systems across T11 Fx  4/9- failed attempt extubation. Decadron X 4 doses for airway edema   4/10 IVFs increased for mild Cr bump  4/11 cr improved w/ IVFs.  passed SBT. Had ETT cuff leak so extubated. Failed extubation 2/2 progressive weakness, worse cough mechanics (essentially just fatigued and progressed to respiratory failure) re-intubated. Sputum sent post re-intubation   4/12 trach at bedside by Kary Kos and Agarwala. Sputum sent. Having bursts of AF  4/13 sputum growing Klebsiella. Low gd temp/ongoing leukocytosis so Ceftriaxone started. GI consulted for diarrhea in context of known chron's   4/14 Afebrile, remains on min vent settings  Interim History / Subjective:  Afebrile. No distress on PSV Objective   Blood pressure (!) 123/48, pulse 72, temperature 98.4 F (36.9 C), temperature source Oral, resp. rate (!) 31, height 5' 9.02" (1.753 m), weight 94.2 kg, SpO2 100 %.    Vent Mode: PSV;CPAP FiO2 (%):  [30 %] 30 % Set Rate:  [16 bmp] 16 bmp Vt Set:  [560 mL] 560 mL PEEP:  [5 cmH20] 5 cmH20 Pressure Support:  [8 [id number] cmH20] 8 cmH20 Plateau Pressure:  [19 cmH20-20 cmH20] 20 cmH20   Intake/Output Summary (Last 24 hours) at 09/21/2020 0931 Last data filed at 09/21/2020 0900 Gross per 24 hour  Intake 3698.02 ml  Output 1550 ml  Net 2148.02 ml   Filed Weights   09/17/20 0400 09/18/20 0500 09/21/20 0500  Weight: 92.5 kg 94 kg 94.2 kg   Physical Exam: General: Chronically ill-appearing, no acute distress HENT: [place], AT, OP clear, MMM Neck: Cuffed trach in  place, c/d/i Eyes: EOMI, no scleral icterus Respiratory: Diminished coarse breath sounds bilaterally.  No wheezing  Cardiovascular: RRR, -M/R/G, no JVD GI: BS+, soft, nontender Extremities: Nonpitting pedal edema,-tenderness Neuro: Opens eyes to voice, follows commands, moves  extremities x 4, profoundly weak bilaterally  Labs/imaging that I havepersonally reviewed  (right click and "Reselect all SmartList Selections" daily)  WBC 15.2>17.3 K 3.3 BUN/Cr 20/0.95  Imaging, labs and test noted above have been reviewed independently by me.  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  -pcxr post trach showed patchy bilateral atelectasis/airspace disease  Klebsiella pneumonia Plan Daily SBT/PSV as tolerated Rest on mechanical ventilation nightly Cont routine trach care Continue Ceftriaxone DC maintenance IVF Lasix 40 mg once Start yupelri VAP bundle  Hypokalemia Replete  Has h/o afib & HTN>had AF w/ RVR but now NSR On eliquis at home Plan Cont tele  Atenolol back and scheduled dilt (took this PRN at home) HCTZ Starting [place] heparin, will message surg about timing of resuming DOAC  Status post stabilization Chance fracture T11-T12 Plan Post-op care per neuro  Will need extensive PT and OT   Remote history of lung cancer status post lung resection of left upper lobe.  Has known nodule left lower lobe since 2021 which has been under observation Plan Outpt CT and Onc follow up   Generalized frailty with recent 40 pound weight loss over the last 6 to 12 months.  Certainly raising consideration for return to malignancy Plan OP follow up   Mild hyperglycemia Plan ssi and basal coverage (goal 140-180) Lantus to 10 bid  Hypothyroidism Plan Synthroid   Diarrhea secondary to TF C.diff and GI panel neg Plan Monitor  Added fiber Appreciate GI consult. Assessed to be low-risk for  flare. No endoscopy indicated Was on mesalamine at home  Urinary retention w/ difficult foley insertion requiring coude cath Plan Urecholine at 33m qid can titrate if needed   Best Practice (right click and "Reselect all SmartList Selections" daily)   Diet:  Tube Feed   If oral type of diet:  Pain/Anxiety/Delirium protocol (if indicated): Yes (RASS goal 0) VAP protocol (if indicated): Yes DVT prophylaxis: Heparin GI prophylaxis: PPI Glucose control:  SSI Yes and Basal insulin Yes Central venous access:  Yes, and it is no longer needed Arterial line:  N/A Foley:  Yes, and it is no longer needed Mobility:  OOB  PT consulted Studies pending: none Culture data pending: none Last reviewed culture data:today Antibiotics:NA  Antibiotic de-escalation: Not indicated Stop date: has Planned stop date (if determined)NA Daily labs: requested Code Status:  full code Prognosis: Life-threating Last date of multidisciplinary goals of care discussion pending  Disposition: Continue present management  The patient is critically ill requiring mechanical ventilation and requires high complexity decision making for assessment and support, frequent evaluation and titration of therapies, application of advanced monitoring technologies and extensive interpretation of multiple databases.  Independent Critical Care Time: 445Minutes.   JRodman Pickle M.D. LMontgomery Eye CenterPulmonary/Critical Care Medicine 09/21/2020 9:54 AM   Please see Amion for pager number to reach on-call Pulmonary and Critical Care Team.

## 2020-09-21 NOTE — Progress Notes (Signed)
Pt placed on ATC 8L of flow with 35% FIO2. Copious yellow, white, thick secretions but able to cough them up and out easily through trach. Some ability to cough up and out orally. Speech to come back in the afternoon to assure pt is able to tolerate ATC well before trialing PMV. WOB WNL, no distress. Daughter at bedside and educated on trach collar. RRT will continue to monitor.   Vent on standby at bedside.

## 2020-09-21 NOTE — Progress Notes (Signed)
PT Cancellation Note  Patient Details Name: Devin [name] MRN: [id number] DOB: June 24, 1940   Cancelled Treatment:    Reason Eval/Treat Not Completed: Fatigue/lethargy limiting ability to participate;Patient at procedure or test/unavailable. Attempted session x1 this morning but pt weaning to trach collar and RT requested return later. Attempted session x2 this afternoon, with pt having fallen asleep to nap due to fatigue from trach collar trials on first attempt and pt requesting PT return tomorrow due to fatigue on 2nd attempt. Will follow-up another day per pt request.   Moishe Spice, PT, DPT Acute Rehabilitation Services  Pager: [phone] Office: [place] 09/21/2020, 4:57 PM

## 2020-09-21 NOTE — Evaluation (Signed)
Passy-Muir Speaking Valve - Evaluation Patient Details  Name: Devin [name] MRN: [id number] Date of Birth: [birth year]  Today's Date: 09/21/2020 Time: 1219-1239 SLP Time Calculation (min) (ACUTE ONLY): 20 min  Past Medical History:  Past Medical History:  Diagnosis Date  . A-fib ([place])   . Adenocarcinoma of left lung, stage 1 ([place]) 03/10/2015  . Ankylosing spondylitis ([place])    Diagnosed during lumbar fracture summer of 2016    . Crohn's disease ([place])   . Gout   . HIstory of basal cell cancer of face    THYROID CA HX  . History of kidney stones   . Hypertension   . Hypothyroidism   . Impotence   . 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 Obesity (BMI 30-39.9)   . Osteoporosis    Pt completed 5 years of fosamax in 2013     . Prostate cancer with recurrence    Treated with prostatectomy with recurrence 2012 with Lupron treatment now him   . Psoriasis   . Thyroid cancer ([place]) 1999   Treated with RAI and total thyroidectomy   . Type II diabetes mellitus ([place])    Past Surgical History:  Past Surgical History:  Procedure Laterality Date  . ABDOMINAL EXPLORATION SURGERY     for small bowel obstruction  . APPENDECTOMY  03/2007  . BACK SURGERY    . BASAL CELL CARCINOMA EXCISION  "several"   "head"  . CARDIAC CATHETERIZATION  03/17/2003  . CHOLECYSTECTOMY N/A 05/07/2016   Procedure: LAPAROSCOPIC CHOLECYSTECTOMY;  Surgeon: Fanny Skates, MD;  Location: [facility];  Service: General;  Laterality: N/A;  . COLON SURGERY  03/2007   Resection of cecum, appendix, terminal ileum (approximately/notes 10/10/2010  . CYSTOSCOPY/URETEROSCOPY/HOLMIUM LASER/STENT PLACEMENT Right 07/03/2020   Procedure: CYSTOSCOPY/RETROGRADE/URETEROSCOPY REMOVAL OF BLADDER STONE;  Surgeon: Raynelle Bring, MD;  Location: [facility];  Service: Urology;  Laterality: Right;  . HERNIA REPAIR    . LAMINECTOMY WITH POSTERIOR LATERAL ARTHRODESIS LEVEL 1 N/A 09/15/2020   Procedure: REVISION OF  THORACOLUMBAR FUSION, ADDITION OF CROSS-CONNECTORS;  Surgeon: Eustace Moore, MD;  Location: [facility];  Service: Neurosurgery;  Laterality: N/A;  . LAPAROSCOPIC CHOLECYSTECTOMY  05/07/2016  . LAPAROSCOPIC LYSIS OF ADHESIONS  05/07/2016  . LAPAROSCOPIC LYSIS OF ADHESIONS N/A 05/07/2016   Procedure: LAPAROSCOPIC LYSIS OF ADHESIONS TIMES ONE HOUR;  Surgeon: Fanny Skates, MD;  Location: [facility];  Service: General;  Laterality: N/A;  . POSTERIOR FUSION THORACIC SPINE  02/08/2016   1. Posterior thoracic arthrodesis T7-T11 utilizing morcellized allograft, 2. Posterior thoracic segmental fixation T7-T11 utilizing nuvasive pedicle screws  . PROSTATECTOMY  06/2001   w/bilateral pelvic lymph nose dissection/notes 10/24/2010  . SPINAL FUSION  12/2014   Open reduction internal fixation of L1 Chance fracture with posterior fusion T10-L4 utilizing morcellized allograft and some local autograft, segmental instrumentation T10-L4 inclusive utilizing nuvasive pedicle screws/notes 12/16/2014  . Stress Cardiolite  02/17/2003  . THOROCOTOMY WITH LOBECTOMY  03/16/2015   Procedure: THOROCOTOMY WITH LOBECTOMY;  Surgeon: Ivin Poot, MD;  Location: [facility];  Service: Thoracic;;  . TONSILLECTOMY    . TOTAL THYROIDECTOMY  1997  . Venous Doppler  05/30/2004  . VENTRAL HERNIA REPAIR  04/14/2008  . VIDEO ASSISTED THORACOSCOPY Left 03/16/2015   Procedure: VIDEO ASSISTED THORACOSCOPY;  Surgeon: Ivin Poot, MD;  Location[facility];  Service: Thoracic;  Laterality: Left;   HPI:  80 yo male admitted s/p fall 3/31 and obtained T11 chance fracture, stable L1 compression fracture, extensive lumbar  spondylosis and postsurgical changes, left 7th rib fx anteriorly, left pleural effusion. Intubated 4/3 and 4/8 s/p rod connection for T11 fx. 4/9 failed attempt extubation. Trach on 4/12 trach placement.  PMH restrictive lung disease, emphysema/COPD adenocarcinoma s/p LUL lobectomy Crohn's, Psoriasis, Afib, nephrolithiasis, HTN, DM, obesity,  thyroid cancer s/p RAI and total thyroidectomy (1999), and prostate cancer s/p prostatectomy (2012) but with recurrence   Assessment / Plan / Recommendation Clinical Impression  Brief vocalization present in 10% of attempts impacted by breath support, positiong and deconditioning. Daughter present for assessment and education re: purpose/procedure of valve. Therapist ensured cuff fully deflated revealing  indications of clear upper airway and back pressure not detected after 20 minutes valve worn. Attempts to increase vocal intensity with greater inhalation, forceful exhalation/vocalization resulted in whisper with false vocal cords. Pitch and resonance variation achieved audible voice for one second with phoneme /m/. As his overall strength and respiratory support improves, his ability to verbally communicate ,utilize upper airway, increase cough and secretion management will improve. Recommend wear intermittently with full supervision. SLP Visit Diagnosis: Aphonia (R49.1)    SLP Assessment  Patient needs continued Speech Lanaguage Pathology Services    Follow Up Recommendations  Inpatient Rehab    Frequency and Duration min 2x/week  2 weeks    PMSV Trial PMSV was placed for: 15 min Able to redirect subglottic air through upper airway: Yes Able to Attain Phonation: Yes (trace) Voice Quality: Hoarse;Low vocal intensity Able to Expectorate Secretions: Yes Level of Secretion Expectoration with PMSV: Oral Breath Support for Phonation: Mildly decreased Intelligibility: Intelligibility reduced Word: 25-49% accurate Phrase: 25-49% accurate Sentence: 0-24% accurate Conversation: 0-24% accurate Respirations During Trial:  (28-32) SpO2 During Trial:  (91-97%) Pulse During Trial: 85 Behavior: Alert;Controlled;Cooperative;Responsive to questions;Good eye contact   Tracheostomy Tube       Vent Dependency  FiO2 (%): 35 %    Cuff Deflation Trial  GO Tolerated Cuff Deflation:  (90%  deflated at baseline) Behavior: Alert;Controlled;Cooperative;Good eye contact;Responsive to questions        [name] 09/21/2020, 2:11 PM Orbie Pyo Colvin Caroli.Ed Risk analyst [phone] Office [phone]

## 2020-09-22 DIAGNOSIS — Z9911 Dependence on respirator [ventilator] status: Secondary | ICD-10-CM | POA: Diagnosis not present

## 2020-09-22 DIAGNOSIS — J9610 Chronic respiratory failure, unspecified whether with hypoxia or hypercapnia: Secondary | ICD-10-CM | POA: Diagnosis not present

## 2020-09-22 DIAGNOSIS — J9621 Acute and chronic respiratory failure with hypoxia: Secondary | ICD-10-CM | POA: Diagnosis not present

## 2020-09-22 DIAGNOSIS — J9622 Acute and chronic respiratory failure with hypercapnia: Secondary | ICD-10-CM | POA: Diagnosis not present

## 2020-09-22 DIAGNOSIS — Z930 Tracheostomy status: Secondary | ICD-10-CM

## 2020-09-22 LAB — BASIC METABOLIC PANEL
Anion gap: 6 (ref 5–15)
BUN: 24 mg/dL — ABNORMAL HIGH (ref 8–23)
CO2: 27 mmol/L (ref 22–32)
Calcium: 7.8 mg/dL — ABNORMAL LOW (ref 8.9–10.3)
Chloride: 98 mmol/L (ref 98–111)
Creatinine, Ser: 1.02 mg/dL (ref 0.61–1.24)
GFR, Estimated: >60 mL/min (ref >60)
Glucose, Bld: 185 mg/dL — ABNORMAL HIGH (ref 70–99)
Potassium: 3.6 mmol/L (ref 3.5–5.1)
Sodium: 131 mmol/L — ABNORMAL LOW (ref 135–145)

## 2020-09-22 LAB — CBC WITH DIFFERENTIAL/PLATELET
Abs Immature Granulocytes: 0.54 10*3/uL — ABNORMAL HIGH (ref 0.00–0.07)
Basophils Absolute: 0 10*3/uL (ref 0.0–0.1)
Basophils Relative: 0 %
Eosinophils Absolute: 0.2 10*3/uL (ref 0.0–0.5)
Eosinophils Relative: 2 %
HCT: 29.8 % — ABNORMAL LOW (ref 39.0–52.0)
Hemoglobin: 9.8 g/dL — ABNORMAL LOW (ref 13.0–17.0)
Immature Granulocytes: 4 %
Lymphocytes Relative: 6 %
Lymphs Abs: 1 10*3/uL (ref 0.7–4.0)
MCH: 30.2 pg (ref 26.0–34.0)
MCHC: 32.9 g/dL (ref 30.0–36.0)
MCV: 91.7 fL (ref 80.0–100.0)
Monocytes Absolute: 0.9 10*3/uL (ref 0.1–1.0)
Monocytes Relative: 6 %
Neutro Abs: 12.9 10*3/uL — ABNORMAL HIGH (ref 1.7–7.7)
Neutrophils Relative %: 82 %
Platelets: 325 10*3/uL (ref 150–400)
RBC: 3.25 MIL/uL — ABNORMAL LOW (ref 4.22–5.81)
RDW: 14.2 % (ref 11.5–15.5)
WBC: 15.6 10*3/uL — ABNORMAL HIGH (ref 4.0–10.5)
nRBC: 0 % (ref 0.0–0.2)

## 2020-09-22 LAB — GLUCOSE, CAPILLARY
Glucose-Capillary: 179 mg/dL — ABNORMAL HIGH (ref 70–99)
Glucose-Capillary: 180 mg/dL — ABNORMAL HIGH (ref 70–99)
Glucose-Capillary: 175 mg/dL — ABNORMAL HIGH (ref 70–99)
Glucose-Capillary: 210 mg/dL — ABNORMAL HIGH (ref 70–99)
Glucose-Capillary: 150 mg/dL — ABNORMAL HIGH (ref 70–99)
Glucose-Capillary: 134 mg/dL — ABNORMAL HIGH (ref 70–99)

## 2020-09-22 LAB — TSH: TSH: 17.912 u[IU]/mL — ABNORMAL HIGH (ref 0.350–4.500)

## 2020-09-22 LAB — T4, FREE: Free T4: 0.7 ng/dL (ref 0.61–1.12)

## 2020-09-22 MED ORDER — POTASSIUM CHLORIDE CRYS ER 20 MEQ PO TBCR: 40.0000 meq | EXTENDED_RELEASE_TABLET | Freq: Once | ORAL | Status: DC

## 2020-09-22 MED ORDER — APIXABAN 5 MG PO TABS: 5.0000 mg | ORAL_TABLET | Freq: Two times a day (BID) | ORAL | Status: DC

## 2020-09-22 MED ORDER — FUROSEMIDE 10 MG/ML IJ SOLN
40.0000 mg | Freq: Once | INTRAMUSCULAR | Status: AC
  Administered 2020-09-22: 40 mg via INTRAVENOUS
  Filled 2020-09-22: qty 4

## 2020-09-22 MED ORDER — APIXABAN 5 MG PO TABS
5.0000 mg | ORAL_TABLET | Freq: Two times a day (BID) | ORAL | Status: DC
  Administered 2020-09-22 – 2020-10-01 (×19): 5 mg
  Filled 2020-09-22 (×21): qty 1

## 2020-09-22 MED ORDER — POTASSIUM CHLORIDE 20 MEQ PO PACK
40.0000 meq | PACK | Freq: Once | ORAL | Status: AC
  Administered 2020-09-22: 40 meq
  Filled 2020-09-22: qty 2

## 2020-09-22 NOTE — Progress Notes (Signed)
  Speech Language Pathology Treatment: [name] Speaking valve  Patient Details Name: Devin [name] MRN: [id number] DOB: September 25, 1940 Today's Date: 09/22/2020 Time: 0347-4259 SLP Time Calculation (min) (ACUTE ONLY): 24 min  Assessment / Plan / Recommendation Clinical Impression  Pt seen for inline PMV with RT present while up in chair, alert and wife present. Pt tolerated slow cuff deflation will with RT, demonstrating expected decrease in VTe showing upper airway patency; RT turned PEEP to 0. Once valve inline, he mobilized secretions to oral cavity to suction. Today he was able to achieve vocal cord phonation for greater utterance length compared to yesterday for 2-3 words after cued to fill lungs with sufficient air prior to verbalizing. Quality is hoarse, low but stimulable with prompts.  Therapist able to decipher 60% of utterances. RR 18, SpO2 99. HR 85%. Valve removed with RT reinflating cuff and PEEP turned up. If/when he is on trach collar, he may wear valve with full supervision with staff. As he increased length off ventilator, restrictions and supervision can be decreased.     HPI HPI: 80 yo male admitted s/p fall 3/31 and obtained T11 chance fracture, stable L1 compression fracture, extensive lumbar spondylosis and postsurgical changes, left 7th rib fx anteriorly, left pleural effusion. Intubated 4/3 and 4/8 s/p rod connection for T11 fx. 4/9 failed attempt extubation. Trach on 4/12 trach placement.  PMH restrictive lung disease, emphysema/COPD adenocarcinoma s/p LUL lobectomy Crohn's, Psoriasis, Afib, nephrolithiasis, HTN, DM, obesity, thyroid cancer s/p RAI and total thyroidectomy (1999), and prostate cancer s/p prostatectomy (2012) but with recurrence      SLP Plan  Continue with current plan of care       Recommendations         Patient may use Passy-Muir Speech Valve:  (if on trach collar- during therapies) PMSV Supervision: Full         General recommendations:  Rehab consult Oral Care Recommendations: Oral care BID Follow up Recommendations: Inpatient Rehab SLP Visit Diagnosis: Aphonia (R49.1) Plan: Continue with current plan of care       GO                [name] 09/22/2020, 2:25 PM  Orbie Pyo Inita Uram M.Ed Risk analyst [phone] Office [phone]

## 2020-09-22 NOTE — Progress Notes (Signed)
Physical Therapy Treatment Patient Details Name: Devin [name] MRN: [id number] DOB: 29-Jan-1941 Today's Date: 09/22/2020    History of Present Illness 80 yo male who presented 4/3 s/p fell at a bookstore 3/31 and obtained T11 chance fracture, stable L1 compression fracture, extensive lumbar spondylosis and postsurgical changes. Additionally had left 7th rib fx anteriorly and left pleural effusion. Pt intubated in ER on 4/3 for agitation and hypoxemia. 4/8 s/p rod connection for T11 fx. 4/9 failed attempt extubation.  4/12 trach placement intubated 4/3  PMH restrictive lung disease, emphysema/COPD adenocarcinoma s/p LUL lobectomy Crohn's, Psoriasis, Afib, nephrolithiasis, HTN, DM, obesity, thyroid cancer s/p RAI and total thyroidectomy (1999), and prostate cancer s/p prostatectomy (2012) but with recurrence    PT Comments    Pt making great progress towards his goals, ambulating x2 bouts of up to ~6-8 ft with a RW and minAx2 with RN supporting trach lines due to its tendency to pop off easily. However, pt with decreased aerobic endurance and thus activity tolerance, needing an extended period of time to sit and rest and try to clear sputum out of throat between gait bouts. Pt demonstrates bil lower extremity strength deficits, primarily in his hip flexors and quads, through displaying bil knee instability and short shuffling steps with poor feet clearance and advancement during gait. Pt positioned sitting up with feet on floor at end of session with plan to remain in chair for at least 2 hours to improve lung function. Will continue to follow acutely. Current recommendations remain appropriate.    Follow Up Recommendations  CIR     Equipment Recommendations  3in1 (PT);Rolling walker with 5" wheels    Recommendations for Other Services Rehab consult     Precautions / Restrictions Precautions Precautions: Fall;Other (comment);Back Precaution Booklet Issued: No Precaution Comments: rectal  tube, trach on vent, foley, NG tube Required Braces or Orthoses: Spinal Brace Spinal Brace: Thoracolumbosacral orthotic;Applied in sitting position Restrictions Weight Bearing Restrictions: No    Mobility  Bed Mobility Overal bed mobility: Needs Assistance Bed Mobility: Rolling;Sidelying to Sit Rolling: Min assist Sidelying to sit: Min assist Supine to sit: +2 for physical assistance;Mod assist;HOB elevated Sit to supine: +2 for physical assistance;Mod assist Sit to sidelying: Min assist General bed mobility comments: pt requires one person to manage trach lines due to it popping loose often. MinA for trunk support, with cues to flex knee and reach across body to roll and transition to sit while maintaining spinal precautions.    Transfers Overall transfer level: Needs assistance Equipment used: Rolling walker (2 wheeled) Transfers: Sit to/from [facility] Sit to Stand: +2 physical assistance;+2 safety/equipment;Min assist Stand pivot transfers: Min assist;+2 physical assistance;+2 safety/equipment       General transfer comment: One person providing support to trach lines as it tends to pop off easily, RN present and aware holding lines for therapists during session. MinAx2 to power up to stand with cues for hand placement, 1x from EOB and 1x from recliner. MinAx2 to support pt and manage lines and RW for stand step to L from EOB > recliner. Close guarding of knees due to noted instability, but no appreciative buckling.  Ambulation/Gait Ambulation/Gait assistance: Min assist;+2 physical assistance;+2 safety/equipment Gait Distance (Feet): 8 Feet (x2 bouts of ~8 ft > ~6 ft) Assistive device: Rolling walker (2 wheeled) Gait Pattern/deviations: Step-through pattern;Decreased step length - right;Decreased step length - left;Decreased stride length;Shuffle;Trunk flexed;Narrow base of support Gait velocity: reduced Gait velocity interpretation: <1.31 ft/sec, indicative  of household ambulator  General Gait Details: Pt with narrow stance, cues to correct with success. Pt needing cues and assistance to manage RW and remain within and proximal to it. Close guarding of knees due to noted instability, but no appreciative buckling. Cues to improve posture, with fair carryover. MinAx2 for stability, managing lines, and managing RW for short shuffling steps forward to vent and backwards to chair with RN supporting trach lines.   Stairs             Wheelchair Mobility    Modified Rankin (Stroke Patients Only)       Balance Overall balance assessment: Needs assistance Sitting-balance support: Bilateral upper extremity supported;Feet supported;Single extremity supported Sitting balance-Leahy Scale: Fair Sitting balance - Comments: Pt with preference for UE support on bed.   Standing balance support: Bilateral upper extremity supported;During functional activity Standing balance-Leahy Scale: Poor Standing balance comment: Reliant on bil UE support and external assist.                            Cognition Arousal/Alertness: Awake/alert Behavior During Therapy: WFL for tasks assessed/performed Overall Cognitive Status: Difficult to assess                                 General Comments: Following one step commands. Slightly flat.      Exercises General Exercises - Lower Extremity Long Arc Quad: Both;5 reps;Seated    General Comments General comments (skin integrity, edema, etc.): Vent settings: PS/CPAP, PEEP 5, 30% conc      Pertinent Vitals/Pain Pain Assessment: Faces Faces Pain Scale: Hurts little more Pain Location: back, generalized with trying to cough up sputum Pain Descriptors / Indicators: Operative site guarding;Discomfort;Grimacing Pain Intervention(s): Limited activity within patient's tolerance;Monitored during session;Repositioned    Home Living                      Prior Function             PT Goals (current goals can now be found in the care plan section) Acute Rehab PT Goals Patient Stated Goal: to get up to chair PT Goal Formulation: With patient Time For Goal Achievement: 10/04/20 Potential to Achieve Goals: Good Progress towards PT goals: Progressing toward goals    Frequency    Min 5X/week      PT Plan Current plan remains appropriate    Co-evaluation PT/OT/SLP Co-Evaluation/Treatment: Yes Reason for Co-Treatment: For patient/therapist safety;To address functional/ADL transfers PT goals addressed during session: Mobility/safety with mobility;Balance;Proper use of DME OT goals addressed during session: ADL's and self-care      AM-PAC PT "6 Clicks" Mobility   Outcome Measure  Help needed turning from your back to your side while in a flat bed without using bedrails?: A Little Help needed moving from lying on your back to sitting on the side of a flat bed without using bedrails?: A Lot Help needed moving to and from a bed to a chair (including a wheelchair)?: A Lot Help needed standing up from a chair using your arms (e.g., wheelchair or bedside chair)?: A Lot Help needed to walk in hospital room?: A Lot Help needed climbing 3-5 steps with a railing? : Total 6 Click Score: 12    End of Session Equipment Utilized During Treatment: Oxygen;Back brace;Gait belt Activity Tolerance: Patient tolerated treatment well Patient left: in chair;with call bell/phone within reach;with chair alarm  set Nurse Communication: Mobility status PT Visit Diagnosis: Pain;Difficulty in walking, not elsewhere classified (R26.2);Unsteadiness on feet (R26.81);Other abnormalities of gait and mobility (R26.89);Muscle weakness (generalized) (M62.81) Pain - part of body:  (back)     Time: 7255-0016 PT Time Calculation (min) (ACUTE ONLY): 44 min  Charges:  $Gait Training: 23-37 mins                     Moishe Spice, PT, DPT Acute Rehabilitation Services  Pager:  [phone] Office: [place] 09/22/2020, 10:40 AM

## 2020-09-22 NOTE — Progress Notes (Signed)
Occupational Therapy Treatment Patient Details Name: Devin [name] MRN: [id number] DOB: Dec 26, 1940 Today's Date: 09/22/2020    History of present illness 80 yo male who presented 4/3 s/p fell at a bookstore 3/31 and obtained T11 chance fracture, stable L1 compression fracture, extensive lumbar spondylosis and postsurgical changes. Additionally had left 7th rib fx anteriorly and left pleural effusion. Pt intubated in ER on 4/3 for agitation and hypoxemia. 4/8 s/p rod connection for T11 fx. 4/9 failed attempt extubation.  4/12 trach placement intubated 4/3  PMH restrictive lung disease, emphysema/COPD adenocarcinoma s/p LUL lobectomy Crohn's, Psoriasis, Afib, nephrolithiasis, HTN, DM, obesity, thyroid cancer s/p RAI and total thyroidectomy (1999), and prostate cancer s/p prostatectomy (2012) but with recurrence   OT comments  Pt progressing towards established OT goals and is very motivated to participate in therapy. Pt performing bed mobility using log roll technique with Min A for trunk support. Pt donning brace with Max A while sitting at EOB. Pt performing stepping towards vent and then back to recliner with RW and Min A +2 (RN present to manage vent). Continue to highly recommend dc to CIR and will continue to follow acutely as admitted.   Follow Up Recommendations  CIR    Equipment Recommendations  3 in 1 bedside commode;Other (comment) (RW)    Recommendations for Other Services Rehab consult (pending d/c from vent)    Precautions / Restrictions Precautions Precautions: Fall;Other (comment);Back Precaution Booklet Issued: No Precaution Comments: rectal tube, trach on vent, foley, NG tube Required Braces or Orthoses: Spinal Brace Spinal Brace: Thoracolumbosacral orthotic;Applied in sitting position Restrictions Weight Bearing Restrictions: No       Mobility Bed Mobility Overal bed mobility: Needs Assistance Bed Mobility: Rolling;Sidelying to Sit Rolling: Min  assist Sidelying to sit: Min assist    General bed mobility comments: pt requires one person to manage trach lines due to it popping loose often. MinA for trunk support, with cues to flex knee and reach across body to roll and transition to sit while maintaining spinal precautions.    Transfers Overall transfer level: Needs assistance Equipment used: Rolling walker (2 wheeled) Transfers: Sit to/from [facility] Sit to Stand: +2 physical assistance;+2 safety/equipment;Min assist Stand pivot transfers: Min assist;+2 physical assistance;+2 safety/equipment       General transfer comment: One person providing support to trach lines as it tends to pop off easily, RN present and aware holding lines for therapists during session. MinAx2 to power up to stand with cues for hand placement, 1x from EOB and 1x from recliner. MinAx2 to support pt and manage lines and RW for stand step to L from EOB > recliner. Close guarding of knees due to noted instability, but no appreciative buckling.    Balance Overall balance assessment: Needs assistance Sitting-balance support: Bilateral upper extremity supported;Feet supported;Single extremity supported Sitting balance-Leahy Scale: Fair    Standing balance support: Bilateral upper extremity supported;During functional activity Standing balance-Leahy Scale: Poor Standing balance comment: Reliant on bil UE support and external assist.                           ADL either performed or assessed with clinical judgement   ADL Overall ADL's : Needs assistance/impaired Eating/Feeding: NPO   Grooming: Maximal assistance;Sitting           Upper Body Dressing : Maximal assistance;Sitting Upper Body Dressing Details (indicate cue type and reason): Max A to don new brace while sitting at EOB Lower  Body Dressing: Total assistance Lower Body Dressing Details (indicate cue type and reason): Total A for donning socks Toilet Transfer:  Minimal assistance;+2 for physical assistance;+2 for safety/equipment;Stand-pivot;RW (simulated to recliner) Toilet Transfer Details (indicate cue type and reason): MIn A for support and RW management.         Functional mobility during ADLs: Rolling walker;Cueing for safety;Minimal assistance;+2 for physical assistance;+2 for safety/equipment General ADL Comments: Pt performing bed mobility, donning brace, and then transfer to recliner. Then performing mobility forwards and backwards ~37f twice.Playing beach music during session. Pt very motivated to get OOB      Vision       Perception     Praxis      Cognition Arousal/Alertness: Awake/alert Behavior During Therapy: WFL for tasks assessed/performed Overall Cognitive Status: Difficult to assess                                 General Comments: Following one step commands. Slightly flat. Pt smiling once standing for first time        Exercises    Shoulder Instructions       General Comments VSS On vent. Vent setting: PEEP 5. 30% conc.    Pertinent Vitals/ Pain       Pain Assessment: Faces Faces Pain Scale: Hurts little more Pain Location: back, generalized with trying to cough up sputum Pain Descriptors / Indicators: Operative site guarding;Discomfort;Grimacing Pain Intervention(s): Monitored during session;Limited activity within patient's tolerance;Repositioned  Home Living                                          Prior Functioning/Environment              Frequency  Min 2X/week        Progress Toward Goals  OT Goals(current goals can now be found in the care plan section)  Progress towards OT goals: Progressing toward goals  Acute Rehab OT Goals Patient Stated Goal: to get up to chair OT Goal Formulation: With patient/family Time For Goal Achievement: 10/04/20 Potential to Achieve Goals: Good ADL Goals Pt Will Perform Grooming: with min assist;sitting Pt Will  Perform Upper Body Bathing: with min assist;sitting Pt Will Transfer to Toilet: bedside commode;stand pivot transfer;with +2 assist;with min assist Additional ADL Goal #1: pt will complete bed mobility mod (A) as precursor to adls. Additional ADL Goal #2: pt will don brace min level as precursor to adls.  Plan Discharge plan remains appropriate    Co-evaluation    PT/OT/SLP Co-Evaluation/Treatment: Yes Reason for Co-Treatment: For patient/therapist safety;To address functional/ADL transfers PT goals addressed during session: Mobility/safety with mobility;Balance;Proper use of DME OT goals addressed during session: ADL's and self-care      AM-PAC OT "6 Clicks" Daily Activity     Outcome Measure   Help from another person eating meals?: A Lot Help from another person taking care of personal grooming?: A Lot Help from another person toileting, which includes using toliet, bedpan, or urinal?: A Lot Help from another person bathing (including washing, rinsing, drying)?: A Lot Help from another person to put on and taking off regular upper body clothing?: A Lot Help from another person to put on and taking off regular lower body clothing?: A Lot 6 Click Score: 12    End of Session Equipment Utilized During Treatment: Gait  belt;Oxygen;Back brace  OT Visit Diagnosis: Unsteadiness on feet (R26.81);Muscle weakness (generalized) (M62.81)   Activity Tolerance Patient tolerated treatment well   Patient Left with call bell/phone within reach;in chair;with chair alarm set   Nurse Communication Mobility status;Precautions        Time: 8069-9967 OT Time Calculation (min): 49 min  Charges: OT General Charges $OT Visit: 1 Visit OT Treatments $Therapeutic Activity: 8-22 mins  Olga, OTR/L Acute Rehab Pager: [phone] Office: [place] 09/22/2020, 11:29 AM

## 2020-09-22 NOTE — Progress Notes (Signed)
ANTICOAGULATION CONSULT NOTE - Initial Consult  Pharmacy Consult for apixaban Indication: atrial fibrillation  No Known Allergies  Patient Measurements: Height: 5' 9.02" (175.3 cm) Weight: 95.7 kg (210 lb 15.7 oz) IBW/kg (Calculated) : 70.74  Vital Signs: Temp: 98.1 F (36.7 C) (04/15 0800) Temp Source: Axillary (04/15 0800) BP: 133/54 (04/15 0800) Pulse Rate: 63 (04/15 0800)  Labs: Recent Labs    09/20/20 0453 09/21/20 0232 09/21/20 0801 09/22/20 0317  HGB 10.3* 9.8*  --  9.8*  HCT 32.8* 30.2*  --  29.8*  PLT 264 323  --  325  CREATININE 0.99  --  0.95 1.02    Estimated Creatinine Clearance: 69.2 mL/min (by C-G formula based on SCr of 1.02 mg/dL).   Medical History: Past Medical History:  Diagnosis Date  . A-fib ([place])   . Adenocarcinoma of left lung, stage 1 ([place]) 03/10/2015  . Ankylosing spondylitis ([place])    Diagnosed during lumbar fracture summer of 2016    . Crohn's disease ([place])   . Gout   . HIstory of basal cell cancer of face    THYROID CA HX  . History of kidney stones   . Hypertension   . Hypothyroidism   . Impotence   . 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 Obesity (BMI 30-39.9)   . Osteoporosis    Pt completed 5 years of fosamax in 2013     . Prostate cancer with recurrence    Treated with prostatectomy with recurrence 2012 with Lupron treatment now him   . Psoriasis   . Thyroid cancer ([place]) 1999   Treated with RAI and total thyroidectomy   . Type II diabetes mellitus ([facility])     Assessment: 80 yom admitted after a fall, now s/p thoracic fusion and fixation. He took apixaban PTA for hx Afib. Pharmacy consulted to resume.   No bleeding noted. He is <80 yo and >60 kg - full dose apixaban appropriate as PTA.  Plan:  D/C SQ heparin Apixaban 5 mg per tube bid Pharmacy signing off consult but will continue to follow peripherally - please re-consult if needed  Thank you for involving pharmacy in this patient's  care.  Renold Genta, PharmD, BCPS Clinical Pharmacist Clinical phone for 09/22/2020 until 3p is [phone] 09/22/2020 9:09 AM  **Pharmacist phone directory can be found on [email or web address] listed under [place]**

## 2020-09-22 NOTE — Progress Notes (Addendum)
NAME:  Devin [name], MRN:  [id number], DOB:  [birth year], LOS: 12 ADMISSION DATE:  09/10/2020, CONSULTATION DATE:  09/12/20 REFERRING MD:  Triad, CHIEF COMPLAINT:  T11-12 Chance Fracture c/b acute on chronic hypercapnic RF   History of Present Illness:  Devin [name] is a 80 y.o. male here for medical stabilization prior to pursuing spine stabilization neurosurgery (connecting prior T7-11 and T11-L4 rods) following a fall with a relevant PMHx of restrictive lung disease, emphysema/COPD, adenocarcinoma s/p LUL lobectomy (2016), ankylosing spondylitis, Crohn's, Afib, nephrolithiasis, HTN, DM, obesity, and thyroid and prostate cancer (see below). Per chart review, patient fell at a bookstore 3/31. New onset back pain - denied head trauma or LOC at that time. Saw PCP on 4/1 and underwent thoracic, lumbar, and chest x-ray. Found to have new T11 Chance fracture.He also has a stable L1 compression fracture, extensive lumbar spondylosis and postsurgical changes. Additionally had a left seventh rib fracture anteriorly and a left pleural effusion without any obvious pneumothorax. Patient was intubated in the ER on 4/3 for agitation and hypoxemia. Was transferred to [facility] for cardiopulmonary stabilization and eventual neurosurgical intervention. Neurosurgery intervention planned for today, 09/15/20.   Pertinent  Medical History  Restrictive lung disease (likely 2/2 prior opioid use for pain management and posture), emphysema, COPD, adenocarcinoma s/p LUL lobectomy (2016), Ankylosing spondylitis, Crohn's, Psoriasis, Afib, nephrolithiasis, HTN, DM, obesity, thyroid cancer s/p RAI and total thyroidectomy (1999), and prostate cancer s/p prostatectomy (2012) but with recurrence.      Significant Hospital Events: Including procedures, antibiotic start and stop dates in addition to other pertinent events    4/3 - Intubated in ER for agitation   4/3 - Resp viral panel PCR Neg  4/3 - Respiratory culture  from ET sent  4/3 - Blood Cx, negative at 48 hrs   4/6 - Min Vent requirements, HDS  4/8 - Neurosurgery plans to link prior rod systems across T11 Fx  4/9- failed attempt extubation. Decadron X 4 doses for airway edema   4/10 IVFs increased for mild Cr bump  4/11 cr improved w/ IVFs.  passed SBT. Had ETT cuff leak so extubated. Failed extubation 2/2 progressive weakness, worse cough mechanics (essentially just fatigued and progressed to respiratory failure) re-intubated. Sputum sent post re-intubation   4/12 trach at bedside by Kary Kos and Agarwala. Sputum sent. Having bursts of AF  4/13 sputum growing Klebsiella. Low gd temp/ongoing leukocytosis so Ceftriaxone started. GI consulted for diarrhea in context of known chron's   4/14 Afebrile, remains on min vent settings  4/15 Tolerated trach collar for 5 hours. Returned to vent after PT  Interim History / Subjective:  Good UOP with diuresis. Tolerating PSV this morning. Continue trach collar trials as tolerated. Per RT continues to have tracheal secretions Objective   Blood pressure (!) 133/54, pulse 63, temperature 98.1 F (36.7 C), temperature source Axillary, resp. rate (!) 23, height 5' 9.02" (1.753 m), weight 95.7 kg, SpO2 96 %.    Vent Mode: PSV;CPAP FiO2 (%):  [30 %-35 %] 30 % Set Rate:  [16 bmp] 16 bmp Vt Set:  [560 mL] 560 mL PEEP:  [5 cmH20] 5 cmH20 Pressure Support:  [8 cmH20] 8 cmH20 Plateau Pressure:  [20 cmH20-21 cmH20] 21 cmH20   Intake/Output Summary (Last 24 hours) at 09/22/2020 0855 Last data filed at 09/22/2020 0800 Gross per 24 hour  Intake 2039.94 ml  Output 4375 ml  Net -2335.06 ml   Filed Weights   09/18/20 0500 09/21/20 0500 09/22/20  0345  Weight: 94 kg 94.2 kg 95.7 kg   Physical Exam: General: Chronically ill-appearing, no acute distress HENT: [place], AT, OP clear, MMM Neck: Trach in place, c/d/i Eyes: EOMI, no scleral icterus Respiratory: Diminished breath sounds bilaterally.  No crackles,  wheezing or rales Cardiovascular: RRR, -M/R/G, no JVD GI: BS+, soft, nontender Extremities: Non pitting pedal edema,-tenderness Neuro: Awake, alert, engaged in conversation, gesticulates with right hand, moves extremities x 4, profoundly weak bilaterally GU: Foley in place   Labs/imaging that I havepersonally reviewed  (right click and "Reselect all SmartList Selections" daily)  WBC 15.2>17.3>15.6 K 3.3>3.6 BUN/Cr 20/0.95> 24/1.02  Imaging, labs and test noted above have been reviewed independently by me.   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  -pcxr post trach showed patchy bilateral atelectasis/airspace disease  Klebsiella pneumonia Small loculated left effusion Plan PSV/Trach collar trial as tolerated during the day Full vent support nightly Cont routine trach care Continue Ceftriaxone D3 Repeat diuresis Continue yupelri and Pulmicort VAP  Hypokalemia Replete in setting of diuresis  Has h/o afib & HTN>had AF w/ RVR but now NSR On eliquis at home Plan Cont tele  Continue home Atenolol, HCTZ and scheduled dilt (took this PRN at home) Restart home eliquis. Discussed with Neurosurgery team  Status post stabilization Chance fracture T11-T12 Plan Post-op care per neuro  Will need extensive PT and OT   Remote history of lung cancer status post lung resection of left upper lobe.  Has known nodule left lower lobe since 2021 which has been under observation Plan Outpt CT and Onc follow up   Generalized frailty with recent 40 pound weight loss over the last 6 to 12 months.  Certainly raising  consideration for return to malignancy Plan OP follow up   Mild hyperglycemia Plan ssi and basal coverage (goal 140-180) Lantus to 10 bid  Hypothyroidism Plan Synthroid   Diarrhea secondary to TF C.diff and GI panel neg Plan Monitor  Added fiber and loperamide Appreciate GI consult. Assessed to be low-risk for flare. No endoscopy indicated Was on mesalamine at home  Urinary retention w/ difficult foley insertion requiring coude cath Plan Urecholine at 63m qid can titrate if needed   Best Practice (right click and "Reselect all SmartList Selections" daily)   Diet:  Tube Feed   If oral type of diet:  Pain/Anxiety/Delirium protocol (if indicated): Yes (RASS goal 0) VAP protocol (if indicated): Yes DVT prophylaxis: Heparin GI prophylaxis: PPI Glucose control:  SSI Yes and Basal insulin Yes Central venous access:  Yes, and it is no longer needed Arterial line:  N/A Foley:  Yes, and it is no longer needed Mobility:  OOB  PT consulted Studies pending: none Culture data pending: none Last reviewed culture data:today Antibiotics:NA  Antibiotic de-escalation: Not indicated Stop date: has Planned stop date (if determined)NA Daily labs: requested Code Status:  full code Prognosis: Life-threating Last date of multidisciplinary goals of care discussion pending  Disposition: Continue present management  Discussed plan with RN, RT and his spouse MJana Halfvia telephone.  The patient is critically ill with chronic respiratory failure requiring mechanical ventilation and requires high complexity decision making for assessment and support, frequent evaluation  and titration of therapies, application of advanced monitoring technologies and extensive interpretation of multiple databases.  Independent Critical Care Time: 32 Minutes.   Rodman Pickle, M.D. [facility] Pulmonary/Critical Care Medicine 09/22/2020 8:55 AM   Please see Amion for pager number to reach on-call Pulmonary and  Critical Care Team.

## 2020-09-23 DIAGNOSIS — J9622 Acute and chronic respiratory failure with hypercapnia: Secondary | ICD-10-CM | POA: Diagnosis not present

## 2020-09-23 DIAGNOSIS — J9610 Chronic respiratory failure, unspecified whether with hypoxia or hypercapnia: Secondary | ICD-10-CM | POA: Diagnosis not present

## 2020-09-23 DIAGNOSIS — Z930 Tracheostomy status: Secondary | ICD-10-CM | POA: Diagnosis not present

## 2020-09-23 DIAGNOSIS — J9621 Acute and chronic respiratory failure with hypoxia: Secondary | ICD-10-CM | POA: Diagnosis not present

## 2020-09-23 LAB — GLUCOSE, CAPILLARY
Glucose-Capillary: 145 mg/dL — ABNORMAL HIGH (ref 70–99)
Glucose-Capillary: 154 mg/dL — ABNORMAL HIGH (ref 70–99)
Glucose-Capillary: 114 mg/dL — ABNORMAL HIGH (ref 70–99)
Glucose-Capillary: 167 mg/dL — ABNORMAL HIGH (ref 70–99)
Glucose-Capillary: 141 mg/dL — ABNORMAL HIGH (ref 70–99)
Glucose-Capillary: 125 mg/dL — ABNORMAL HIGH (ref 70–99)

## 2020-09-23 LAB — CBC
HCT: 29.2 % — ABNORMAL LOW (ref 39.0–52.0)
Hemoglobin: 9.4 g/dL — ABNORMAL LOW (ref 13.0–17.0)
MCH: 30.5 pg (ref 26.0–34.0)
MCHC: 32.2 g/dL (ref 30.0–36.0)
MCV: 94.8 fL (ref 80.0–100.0)
Platelets: 309 10*3/uL (ref 150–400)
RBC: 3.08 MIL/uL — ABNORMAL LOW (ref 4.22–5.81)
RDW: 14.4 % (ref 11.5–15.5)
WBC: 13.7 10*3/uL — ABNORMAL HIGH (ref 4.0–10.5)
nRBC: 0 % (ref 0.0–0.2)

## 2020-09-23 LAB — BASIC METABOLIC PANEL
Anion gap: 7 (ref 5–15)
BUN: 34 mg/dL — ABNORMAL HIGH (ref 8–23)
CO2: 33 mmol/L — ABNORMAL HIGH (ref 22–32)
Calcium: 8.3 mg/dL — ABNORMAL LOW (ref 8.9–10.3)
Chloride: 96 mmol/L — ABNORMAL LOW (ref 98–111)
Creatinine, Ser: 1.12 mg/dL (ref 0.61–1.24)
GFR, Estimated: >60 mL/min (ref >60)
Glucose, Bld: 167 mg/dL — ABNORMAL HIGH (ref 70–99)
Potassium: 4.2 mmol/L (ref 3.5–5.1)
Sodium: 136 mmol/L (ref 135–145)

## 2020-09-23 MED ORDER — FUROSEMIDE 10 MG/ML IJ SOLN
40.0000 mg | Freq: Once | INTRAMUSCULAR | Status: AC
  Administered 2020-09-23: 40 mg via INTRAVENOUS
  Filled 2020-09-23: qty 4

## 2020-09-23 NOTE — Progress Notes (Signed)
NAME:  Devin [name], MRN:  [id number], DOB:  Mar 20, 1941, LOS: 72 ADMISSION DATE:  09/10/2020, CONSULTATION DATE:  09/12/20 REFERRING MD:  Triad, CHIEF COMPLAINT:  T11-12 Chance Fracture c/b acute on chronic hypercapnic RF   History of Present Illness:  Devin [name] is a 80 y.o. male here for medical stabilization prior to pursuing spine stabilization neurosurgery (connecting prior T7-11 and T11-L4 rods) following a fall with a relevant PMHx of restrictive lung disease, emphysema/COPD, adenocarcinoma s/p LUL lobectomy (2016), ankylosing spondylitis, Crohn's, Afib, nephrolithiasis, HTN, DM, obesity, and thyroid and prostate cancer (see below). Per chart review, patient fell at a bookstore 3/31. New onset back pain - denied head trauma or LOC at that time. Saw PCP on 4/1 and underwent thoracic, lumbar, and chest x-ray. Found to have new T11 Chance fracture.He also has a stable L1 compression fracture, extensive lumbar spondylosis and postsurgical changes. Additionally had a left seventh rib fracture anteriorly and a left pleural effusion without any obvious pneumothorax. Patient was intubated in the ER on 4/3 for agitation and hypoxemia. Was transferred to [facility] for cardiopulmonary stabilization and eventual neurosurgical intervention. Neurosurgery intervention planned for today, 09/15/20.   Pertinent  Medical History  Restrictive lung disease (likely 2/2 prior opioid use for pain management and posture), emphysema, COPD, adenocarcinoma s/p LUL lobectomy (2016), Ankylosing spondylitis, Crohn's, Psoriasis, Afib, nephrolithiasis, HTN, DM, obesity, thyroid cancer s/p RAI and total thyroidectomy (1999), and prostate cancer s/p prostatectomy (2012) but with recurrence.      Significant Hospital Events: Including procedures, antibiotic start and stop dates in addition to other pertinent events    4/3 - Intubated in ER for agitation   4/3 - Resp viral panel PCR Neg  4/3 - Respiratory culture  from ET sent  4/3 - Blood Cx, negative at 48 hrs   4/6 - Min Vent requirements, HDS  4/8 - Neurosurgery plans to link prior rod systems across T11 Fx  4/9- failed attempt extubation. Decadron X 4 doses for airway edema   4/10 IVFs increased for mild Cr bump  4/11 cr improved w/ IVFs.  passed SBT. Had ETT cuff leak so extubated. Failed extubation 2/2 progressive weakness, worse cough mechanics (essentially just fatigued and progressed to respiratory failure) re-intubated. Sputum sent post re-intubation   4/12 trach at bedside by Devin [name] and Devin [name]. Sputum sent. Having bursts of AF  4/13 sputum growing Klebsiella. Low gd temp/ongoing leukocytosis so Ceftriaxone started. GI consulted for diarrhea in context of known chron's   4/14 Tolerated trach collar for 5 hours. Returned to vent after PT  4/15 Diuresis  Interim History / Subjective:  Diuresed. Tolerated PT on RW and sitting in chair yesterday  Objective   Blood pressure (!) 133/53, pulse 67, temperature 98.9 F (37.2 C), temperature source Oral, resp. rate (!) 21, height 5' 9.02" (1.753 m), weight 91.2 kg, SpO2 100 %.    Vent Mode: PRVC FiO2 (%):  [30 %] 30 % Set Rate:  [16 bmp] 16 bmp Vt Set:  [560 mL] 560 mL PEEP:  [5 cmH20] 5 cmH20 Pressure Support:  [8 cmH20] 8 cmH20 Plateau Pressure:  [18 cmH20-22 cmH20] 20 cmH20   Intake/Output Summary (Last 24 hours) at 09/23/2020 0722 Last data filed at 09/23/2020 0600 Gross per 24 hour  Intake 1765 ml  Output 2350 ml  Net -585 ml   Filed Weights   09/21/20 0500 09/22/20 0345 09/23/20 0500  Weight: 94.2 kg 95.7 kg 91.2 kg   Physical Exam: General: Chronically ill-appearing,  no acute distress HENT: [place], AT, OP clear, MMM Eyes: EOMI, no scleral icterus Respiratory: Diminished breath sounds bilaterally.  No crackles, wheezing or rales Cardiovascular: RRR, -M/R/G, no JVD GI: BS+, soft, nontender Extremities:-Edema,-tenderness Neuro: Awake, alert, communicating through  writing, moves extremities x 4 GU: Foley in place   Labs/imaging that I havepersonally reviewed  (right click and "Reselect all SmartList Selections" daily)  WBC 15.2>17.3>15.6>13.7 - resolving K 4.2 BUN/Cr 34/1.12  Imaging, labs and test noted above have been reviewed independently by me.    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  -pcxr post trach showed patchy bilateral atelectasis/airspace disease  Klebsiella pneumonia Small loculated left effusion Plan PSV and trach collar trials as tolerated during the day Use vent support during PT to facilitate  Full vent support nightly Cont routine trach care Cont Speech Continue Ceftriaxone D4 Gentle diuresis today Continue yupelri and Pulmicort VAP  Hypokalemia Trend daily  Has h/o afib & HTN>had AF w/ RVR but now NSR On eliquis at home Plan Cont tele  Continue home Atenolol, HCTZ and scheduled dilt (took this PRN at home) On home eliquis 4/15. Discussed with Neurosurgery team  Status post stabilization Chance fracture T11-T12 Plan Post-op care per neuro  Continue PT/OT  Remote history of lung cancer status post lung resection of left upper lobe.  Has known nodule left lower lobe since 2021 which has been under observation Plan Outpt CT and Onc follow up   Generalized frailty with recent 40 pound weight loss over the last 6 to 12 months.  Certainly raising consideration for return to malignancy Plan OP follow up   Mild hyperglycemia Plan ssi and basal coverage (goal 140-180) Lantus to 10 bid  Hypothyroidism Plan Synthroid   Diarrhea  secondary to TF C.diff and GI panel neg Plan Monitor  Added fiber and loperamide Appreciate GI consult. Assessed to be low-risk for flare. No endoscopy indicated Was on mesalamine at home  Urinary retention w/ difficult foley insertion requiring coude cath Plan Urecholine at 73m qid can titrate if needed   Best Practice (right click and "Reselect all SmartList Selections" daily)   Diet:  Tube Feed   If oral type of diet:  Pain/Anxiety/Delirium protocol (if indicated): Yes (RASS goal 0) VAP protocol (if indicated): Yes DVT prophylaxis: Heparin GI prophylaxis: PPI Glucose control:  SSI Yes and Basal insulin Yes Central venous access:  Yes, and it is no longer needed Arterial line:  N/A Foley:  Yes, and it is no longer needed Mobility:  OOB  PT consulted Studies pending: none Culture data pending: none Last reviewed culture data:today Antibiotics:NA  Antibiotic de-escalation: Not indicated Stop date: has Planned stop date (if determined)NA Daily labs: requested Code Status:  full code Prognosis: Life-threating Last date of multidisciplinary goals of care discussion pending  Disposition: Continue present management  The patient is critically ill with multiple organ systems failure and requires high complexity decision making for assessment and support, frequent evaluation and titration of therapies, application of advanced monitoring technologies and extensive interpretation of multiple databases.  Independent Critical Care Time: 35 Minutes.   JRodman Pickle M.D. [facility] 09/23/2020 7:22 AM   Please see  Amion for pager number to reach on-call Pulmonary and Critical Care Team.

## 2020-09-24 DIAGNOSIS — J9622 Acute and chronic respiratory failure with hypercapnia: Secondary | ICD-10-CM | POA: Diagnosis not present

## 2020-09-24 DIAGNOSIS — J9621 Acute and chronic respiratory failure with hypoxia: Secondary | ICD-10-CM | POA: Diagnosis not present

## 2020-09-24 DIAGNOSIS — J9610 Chronic respiratory failure, unspecified whether with hypoxia or hypercapnia: Secondary | ICD-10-CM | POA: Diagnosis not present

## 2020-09-24 DIAGNOSIS — Z930 Tracheostomy status: Secondary | ICD-10-CM | POA: Diagnosis not present

## 2020-09-24 LAB — BASIC METABOLIC PANEL
Anion gap: 9 (ref 5–15)
BUN: 42 mg/dL — ABNORMAL HIGH (ref 8–23)
CO2: 32 mmol/L (ref 22–32)
Calcium: 8.3 mg/dL — ABNORMAL LOW (ref 8.9–10.3)
Chloride: 95 mmol/L — ABNORMAL LOW (ref 98–111)
Creatinine, Ser: 1.11 mg/dL (ref 0.61–1.24)
GFR, Estimated: >60 mL/min (ref >60)
Glucose, Bld: 129 mg/dL — ABNORMAL HIGH (ref 70–99)
Potassium: 3.8 mmol/L (ref 3.5–5.1)
Sodium: 136 mmol/L (ref 135–145)

## 2020-09-24 LAB — GLUCOSE, CAPILLARY
Glucose-Capillary: 130 mg/dL — ABNORMAL HIGH (ref 70–99)
Glucose-Capillary: 167 mg/dL — ABNORMAL HIGH (ref 70–99)
Glucose-Capillary: 92 mg/dL (ref 70–99)
Glucose-Capillary: 97 mg/dL (ref 70–99)
Glucose-Capillary: 101 mg/dL — ABNORMAL HIGH (ref 70–99)
Glucose-Capillary: 184 mg/dL — ABNORMAL HIGH (ref 70–99)

## 2020-09-24 MED ORDER — ONDANSETRON HCL 4 MG/2ML IJ SOLN
4.0000 mg | Freq: Four times a day (QID) | INTRAMUSCULAR | Status: DC | PRN
  Administered 2020-09-24: 4 mg via INTRAVENOUS
  Filled 2020-09-24: qty 2

## 2020-09-24 MED ORDER — FUROSEMIDE 10 MG/ML IJ SOLN
40.0000 mg | Freq: Two times a day (BID) | INTRAMUSCULAR | Status: AC
  Administered 2020-09-24 (×2): 40 mg via INTRAVENOUS
  Filled 2020-09-24 (×2): qty 4

## 2020-09-24 NOTE — Progress Notes (Signed)
NAME:  Devin [name], MRN:  [id number], DOB:  1940-06-13, LOS: 46 ADMISSION DATE:  09/10/2020, CONSULTATION DATE:  09/12/20 REFERRING MD:  Triad, CHIEF COMPLAINT:  T11-12 Chance Fracture c/b acute on chronic hypercapnic RF   History of Present Illness:  Devin [name] is a 80 y.o. male here for medical stabilization prior to pursuing spine stabilization neurosurgery (connecting prior T7-11 and T11-L4 rods) following a fall with a relevant PMHx of restrictive lung disease, emphysema/COPD, adenocarcinoma s/p LUL lobectomy (2016), ankylosing spondylitis, Crohn's, Afib, nephrolithiasis, HTN, DM, obesity, and thyroid and prostate cancer (see below). Per chart review, patient fell at a bookstore 3/31. New onset back pain - denied head trauma or LOC at that time. Saw PCP on 4/1 and underwent thoracic, lumbar, and chest x-ray. Found to have new T11 Chance fracture.He also has a stable L1 compression fracture, extensive lumbar spondylosis and postsurgical changes. Additionally had a left seventh rib fracture anteriorly and a left pleural effusion without any obvious pneumothorax. Patient was intubated in the ER on 4/3 for agitation and hypoxemia. Was transferred to [facility] for cardiopulmonary stabilization and eventual neurosurgical intervention. Neurosurgery intervention planned for today, 09/15/20.   Pertinent  Medical History  Restrictive lung disease (likely 2/2 prior opioid use for pain management and posture), emphysema, COPD, adenocarcinoma s/p LUL lobectomy (2016), Ankylosing spondylitis, Crohn's, Psoriasis, Afib, nephrolithiasis, HTN, DM, obesity, thyroid cancer s/p RAI and total thyroidectomy (1999), and prostate cancer s/p prostatectomy (2012) but with recurrence.      Significant Hospital Events: Including procedures, antibiotic start and stop dates in addition to other pertinent events    4/3 - Intubated in ER for agitation   4/3 - Resp viral panel PCR Neg  4/3 - Respiratory culture  from ET sent  4/3 - Blood Cx, negative at 48 hrs   4/6 - Min Vent requirements, HDS  4/8 - Neurosurgery plans to link prior rod systems across T11 Fx  4/9- failed attempt extubation. Decadron X 4 doses for airway edema   4/10 IVFs increased for mild Cr bump  4/11 cr improved w/ IVFs.  passed SBT. Had ETT cuff leak so extubated. Failed extubation 2/2 progressive weakness, worse cough mechanics (essentially just fatigued and progressed to respiratory failure) re-intubated. Sputum sent post re-intubation   4/12 trach at bedside by Kary Kos and Agarwala. Sputum sent. Having bursts of AF  4/13 sputum growing Klebsiella. Low gd temp/ongoing leukocytosis so Ceftriaxone started. GI consulted for diarrhea in context of known chron's   4/14 Tolerated trach collar for 5 hours. Returned to vent after PT  4/15 Diuresis.  4/16 Tolerated 10 hours of trach collar with break in between  Interim History / Subjective:  Foley leaked yesterday. Tolerated 10 hours of trach collar with break in between  Objective   Blood pressure (!) 131/57, pulse 64, temperature 98.6 F (37 C), temperature source Oral, resp. rate (!) 26, height 5' 9.02" (1.753 m), weight 92 kg, SpO2 100 %.    Vent Mode: PSV;CPAP FiO2 (%):  [28 %-30 %] 30 % Set Rate:  [16 bmp] 16 bmp Vt Set:  [560 mL] 560 mL PEEP:  [5 cmH20] 5 cmH20 Pressure Support:  [8 cmH20] 8 cmH20 Plateau Pressure:  [7 cmH20-18 cmH20] 7 cmH20   Intake/Output Summary (Last 24 hours) at 09/24/2020 1013 Last data filed at 09/24/2020 0900 Gross per 24 hour  Intake 1610 ml  Output 1815 ml  Net -205 ml   Filed Weights   09/22/20 0345 09/23/20 0500 09/24/20  0400  Weight: 95.7 kg 91.2 kg 92 kg   Physical Exam: General: Chronically ill-appearing, no acute distress HENT: [place], AT, OP clear, MMM Eyes: EOMI, no scleral icterus Respiratory: Diminished breath sounds bilaterally.  No crackles, wheezing or rales Cardiovascular: RRR, -M/R/G, no JVD GI: BS+, soft,  nontender Extremities:-Edema,-tenderness Neuro: AAO x4, follows commands moving extremities x 4 GU: Condom cath in place   Labs/imaging that I havepersonally reviewed  (right click and "Reselect all SmartList Selections" daily)  K 3.8 BUN/Cr 42/1.11  Imaging, labs and test noted above have been reviewed independently by me.  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  -pcxr post trach showed patchy bilateral atelectasis/airspace disease  Klebsiella pneumonia Small loculated left effusion Plan PSV and trach collar trials as tolerated during the day Use vent support during PT to facilitate activity Full vent support nightly Cont routine trach care Cont Speech Continue Ceftriaxone D5 Diuresis. Improved cumulative fluid balance Continue yupelri and Pulmicort VAP  Hypokalemia - resolved Trend daily Replete as needed  Has h/o afib & HTN>had AF w/ RVR but now NSR On eliquis at home Plan Cont tele  Continue home Atenolol, HCTZ and scheduled dilt (took this PRN at home) On home eliquis 4/15. Discussed with Neurosurgery team  Status post stabilization Chance fracture T11-T12 Plan Post-op care per neuro  Continue PT/OT  Remote history of lung cancer status post lung resection of left upper lobe.  Has known nodule left lower lobe since 2021 which has been under observation Plan Outpt CT and Onc follow up   Generalized frailty with recent 40 pound weight loss over the last 6 to 12 months.  Certainly raising consideration for return to malignancy Plan OP follow up   Mild hyperglycemia Plan ssi and basal  coverage (goal 140-180) Lantus to 10 bid  Hypothyroidism Plan Synthroid   Diarrhea secondary to TF C.diff and GI panel neg Plan Monitor  Added fiber and loperamide Appreciate GI consult. Assessed to be low-risk for flare. No endoscopy indicated Was on mesalamine at home  Urinary retention w/ difficult foley insertion requiring coude cath Plan Urecholine at 49m qid can titrate if needed   Best Practice (right click and "Reselect all SmartList Selections" daily)   Diet:  Tube Feed   Pain/Anxiety/Delirium protocol (if indicated): Yes (RASS goal 0) VAP protocol (if indicated): Yes DVT prophylaxis: Heparin GI prophylaxis: PPI Glucose control:  SSI Yes and Basal insulin Yes Central venous access:  Yes, and it is no longer needed Arterial line:  N/A Foley:  N/A Condom cath Mobility:  OOB  PT consulted Studies pending: none Culture data pending: none Last reviewed culture data:today Antibiotics:NA  Antibiotic de-escalation: Not indicated Stop date: has Planned stop date (if determined)NA Daily labs: requested Code Status:  full code Prognosis: Life-threating Last date of multidisciplinary goals of care discussion pending  Disposition: Continue present management  The patient is critically ill with multiple organ systems failure and requires high complexity decision making for assessment and support, frequent evaluation and titration of therapies, application of advanced monitoring technologies and extensive interpretation of multiple databases.  Independent Critical Care Time: 35 Minutes.   JRodman Pickle M.D. [facility] Medical GroupPulmonary/Critical Care Medicine  09/24/2020 10:13 AM   Please see Amion for pager number to reach on-call Pulmonary and Critical Care Team.

## 2020-09-24 NOTE — Progress Notes (Signed)
Patient asking to move from chair back to bed. Upon entering the room, HR 40s and patient stated "not feeling well" and "feeling nauseous". Patient was previously weaning on vent, but placed back on PRVC, paged MD for prn zofran, and held tube feedings. HR returned to 60s and patient able to return to bed. RT at bedside, as well.

## 2020-09-25 DIAGNOSIS — S2232XA Fracture of one rib, left side, initial encounter for closed fracture: Secondary | ICD-10-CM | POA: Diagnosis not present

## 2020-09-25 DIAGNOSIS — Z930 Tracheostomy status: Secondary | ICD-10-CM | POA: Diagnosis not present

## 2020-09-25 DIAGNOSIS — J9602 Acute respiratory failure with hypercapnia: Secondary | ICD-10-CM | POA: Diagnosis not present

## 2020-09-25 DIAGNOSIS — J9601 Acute respiratory failure with hypoxia: Secondary | ICD-10-CM | POA: Diagnosis not present

## 2020-09-25 LAB — GLUCOSE, CAPILLARY
Glucose-Capillary: 135 mg/dL — ABNORMAL HIGH (ref 70–99)
Glucose-Capillary: 130 mg/dL — ABNORMAL HIGH (ref 70–99)
Glucose-Capillary: 116 mg/dL — ABNORMAL HIGH (ref 70–99)
Glucose-Capillary: 136 mg/dL — ABNORMAL HIGH (ref 70–99)
Glucose-Capillary: 144 mg/dL — ABNORMAL HIGH (ref 70–99)
Glucose-Capillary: 146 mg/dL — ABNORMAL HIGH (ref 70–99)

## 2020-09-25 LAB — BASIC METABOLIC PANEL
Anion gap: 9 (ref 5–15)
BUN: 46 mg/dL — ABNORMAL HIGH (ref 8–23)
CO2: 33 mmol/L — ABNORMAL HIGH (ref 22–32)
Calcium: 8.5 mg/dL — ABNORMAL LOW (ref 8.9–10.3)
Chloride: 91 mmol/L — ABNORMAL LOW (ref 98–111)
Creatinine, Ser: 1.28 mg/dL — ABNORMAL HIGH (ref 0.61–1.24)
GFR, Estimated: 58 mL/min — ABNORMAL LOW (ref >60)
Glucose, Bld: 132 mg/dL — ABNORMAL HIGH (ref 70–99)
Potassium: 3.6 mmol/L (ref 3.5–5.1)
Sodium: 133 mmol/L — ABNORMAL LOW (ref 135–145)

## 2020-09-25 MED ORDER — LOPERAMIDE HCL 1 MG/7.5ML PO SUSP
4.0000 mg | Freq: Two times a day (BID) | ORAL | Status: DC
  Administered 2020-09-25 – 2020-09-30 (×10): 4 mg
  Filled 2020-09-25 (×13): qty 30

## 2020-09-25 MED ORDER — POTASSIUM CHLORIDE 10 MEQ/100ML IV SOLN
10.0000 meq | INTRAVENOUS | Status: AC
  Administered 2020-09-25 (×3): 10 meq via INTRAVENOUS
  Filled 2020-09-25 (×3): qty 100

## 2020-09-25 MED ORDER — PHENOL 1.4 % MT LIQD
1.0000 | OROMUCOSAL | Status: DC | PRN
  Administered 2020-09-25: 1 via OROMUCOSAL
  Filled 2020-09-25: qty 177

## 2020-09-25 NOTE — Progress Notes (Signed)
Physical Therapy Treatment Patient Details Name: Devin [name] MRN: [id number] DOB: [birth year] Today's Date: 09/25/2020    History of Present Illness 80 yo male who presented 4/3 s/p fell at a bookstore 3/31 and obtained T11 chance fracture, stable L1 compression fracture, extensive lumbar spondylosis and postsurgical changes. Additionally had left 7th rib fx anteriorly and left pleural effusion. Pt intubated in ER on 4/3 for agitation and hypoxemia. 4/8 s/p rod connection for T11 fx. 4/9 failed attempt extubation.  4/12 trach placement intubated 4/3  PMH restrictive lung disease, emphysema/COPD adenocarcinoma s/p LUL lobectomy Crohn's, Psoriasis, Afib, nephrolithiasis, HTN, DM, obesity, thyroid cancer s/p RAI and total thyroidectomy (1999), and prostate cancer s/p prostatectomy (2012) but with recurrence    PT Comments    Pt with great tolerance of session with focus on LE strengthening to facilitate improved performance with future OOB progression. RN had asked to focus on exercises due to lack of rest overnight, but pt tolerated well with VSS and able to complete multiple sets against min/mod resistance. Exercises focused on BLE hip and knee flexors, quads, and hip extension. The pt had increased secretions during session, but was able to direct care and manage with suction without assist. Continue to recommend CIR therapies when stable, will continue to benefit from skilled PT to progress functional strength and OOB mobility tolerance.    Follow Up Recommendations  CIR     Equipment Recommendations  3in1 (PT);Rolling walker with 5" wheels    Recommendations for Other Services       Precautions / Restrictions Precautions Precautions: Fall;Other (comment);Back Precaution Booklet Issued: No Precaution Comments: rectal tube, trach collar, foley, NG tube Required Braces or Orthoses: Spinal Brace Spinal Brace: Thoracolumbosacral orthotic;Applied in sitting  position Restrictions Weight Bearing Restrictions: No    Mobility  Bed Mobility Overal bed mobility: Needs Assistance             General bed mobility comments: bed egress to chair used to change position this session. RN asked to take this session easier due to eventful overnight with lack of sleep.    Transfers                 General transfer comment: deferred this session to focus on LE exercise            Cognition Arousal/Alertness: Awake/alert Behavior During Therapy: WFL for tasks assessed/performed Overall Cognitive Status: Difficult to assess                                 General Comments: Following one step commands. Slightly flat.      Exercises General Exercises - Lower Extremity Ankle Circles/Pumps: AROM;Both;10 reps;Seated Short Arc Quad: AROM;Strengthening;Both;10 reps;Seated (x5 AROM, x5 against min resistance, 2 sets) Heel Slides: AROM;Strengthening;Both;10 reps;Seated (x5 AROM, x5 against min resistance, 2 sets) Hip Flexion/Marching: AROM;Strengthening;Both;10 reps;Seated (x5 AROM, x5 against min resistance, 2 sets) Other Exercises Other Exercises: glute extension against min resistance x10 Other Exercises: overhead punches alternating with forward punches (shoulder flexion) x10 AROM then x10 each with 1-2 lb weight in hand        Pertinent Vitals/Pain Pain Assessment: Faces Faces Pain Scale: Hurts a little bit Pain Location: with coughing Pain Descriptors / Indicators: Operative site guarding;Discomfort;Grimacing Pain Intervention(s): Monitored during session;Repositioned           PT Goals (current goals can now be found in the care plan section) Acute Rehab PT Goals Patient  Stated Goal: to get up to chair PT Goal Formulation: With patient Time For Goal Achievement: 10/04/20 Potential to Achieve Goals: Good Progress towards PT goals: Progressing toward goals    Frequency    Min 5X/week      PT Plan  Current plan remains appropriate       AM-PAC PT "6 Clicks" Mobility   Outcome Measure  Help needed turning from your back to your side while in a flat bed without using bedrails?: A Little Help needed moving from lying on your back to sitting on the side of a flat bed without using bedrails?: A Lot Help needed moving to and from a bed to a chair (including a wheelchair)?: A Lot Help needed standing up from a chair using your arms (e.g., wheelchair or bedside chair)?: A Lot Help needed to walk in hospital room?: A Lot Help needed climbing 3-5 steps with a railing? : Total 6 Click Score: 12    End of Session Equipment Utilized During Treatment: Oxygen;Back brace;Gait belt Activity Tolerance: Patient tolerated treatment well Patient left: in bed;with bed alarm set;with call bell/phone within reach Nurse Communication: Mobility status PT Visit Diagnosis: Pain;Difficulty in walking, not elsewhere classified (R26.2);Unsteadiness on feet (R26.81);Other abnormalities of gait and mobility (R26.89);Muscle weakness (generalized) (M62.81) Pain - part of body:  (back)     Time: 3016-0109 PT Time Calculation (min) (ACUTE ONLY): 31 min  Charges:  $Therapeutic Exercise: 23-37 mins                     Karma Ganja, PT, DPT   Acute Rehabilitation Department Pager #: [phone]   Otho Bellows 09/25/2020, 5:44 PM

## 2020-09-25 NOTE — Progress Notes (Signed)
NAME:  Devin [name], MRN:  [id number], DOB:  [birth year], LOS: 70 ADMISSION DATE:  09/10/2020, CONSULTATION DATE:  09/12/20 REFERRING MD:  Triad, CHIEF COMPLAINT:  T11-12 Chance Fracture c/b acute on chronic hypercapnic RF   History of Present Illness:  Devin [name] is a 80 y.o. male here for medical stabilization prior to pursuing spine stabilization neurosurgery (connecting prior T7-11 and T11-L4 rods) following a fall with a relevant PMHx of restrictive lung disease, emphysema/COPD, adenocarcinoma s/p LUL lobectomy (2016), ankylosing spondylitis, Crohn's, Afib, nephrolithiasis, HTN, DM, obesity, and thyroid and prostate cancer (see below). Per chart review, patient fell at a bookstore 3/31. New onset back pain - denied head trauma or LOC at that time. Saw PCP on 4/1 and underwent thoracic, lumbar, and chest x-ray. Found to have new T11 Chance fracture.He also has a stable L1 compression fracture, extensive lumbar spondylosis and postsurgical changes. Additionally had a left seventh rib fracture anteriorly and a left pleural effusion without any obvious pneumothorax. Patient was intubated in the ER on 4/3 for agitation and hypoxemia. Was transferred to [facility] for cardiopulmonary stabilization and eventual neurosurgical intervention. Neurosurgery intervention planned for today, 09/15/20.  Significant Hospital Events: Including procedures, antibiotic start and stop dates in addition to other pertinent events    4/3 - Intubated in ER for agitation   4/3 - Resp viral panel PCR Neg  4/3 - Respiratory culture from ET sent  4/3 - Blood Cx, negative at 48 hrs   4/6 - Min Vent requirements, HDS  4/8 - Neurosurgery plans to link prior rod systems across T11 Fx  4/9- failed attempt extubation. Decadron X 4 doses for airway edema   4/10 IVFs increased for mild Cr bump  4/11 cr improved w/ IVFs.  passed SBT. Had ETT cuff leak so extubated. Failed extubation 2/2 progressive weakness, worse  cough mechanics (essentially just fatigued and progressed to respiratory failure) re-intubated. Sputum sent post re-intubation   4/12 trach at bedside by Kary Kos and Agarwala. Sputum sent. Having bursts of AF  4/13 sputum growing Klebsiella. Low gd temp/ongoing leukocytosis so Ceftriaxone started. GI consulted for diarrhea in context of known chron's   4/14 Tolerated trach collar for 5 hours. Returned to vent after PT  4/15 Diuresis.  4/16 Tolerated 10 hours of trach collar with break in between    Interim History / Subjective:  Stated had rough night last night, could not sleep because he felt gagging or sensation of something stuck in his throat This morning he was placed on trach collar, tolerating well so far  Objective   Blood pressure (!) 125/55, pulse (!) 59, temperature 98 F (36.7 C), temperature source Oral, resp. rate (!) 22, height 5' 9.02" (1.753 m), weight 94.2 kg, SpO2 100 %.    Vent Mode: PRVC FiO2 (%):  [28 %-30 %] 28 % Set Rate:  [16 bmp] 16 bmp Vt Set:  [560 mL] 560 mL PEEP:  [5 cmH20] 5 cmH20 Plateau Pressure:  [20 cmH20-22 cmH20] 21 cmH20   Intake/Output Summary (Last 24 hours) at 09/25/2020 1025 Last data filed at 09/25/2020 0537 Gross per 24 hour  Intake 1274.5 ml  Output 2850 ml  Net -1575.5 ml   Filed Weights   09/23/20 0500 09/24/20 0400 09/25/20 0400  Weight: 91.2 kg 92 kg 94.2 kg   Physical Exam: General: Chronically ill-appearing, lying in the bed HENT: [place], AT, OP clear, MMM, s/p trach Eyes: EOMI, no scleral icterus Respiratory: Diminished breath sounds bilaterally.  No crackles,  wheezing or rales Cardiovascular: RRR, -M/R/G, no JVD GI: BS+, soft, nontender Extremities:-Edema,-tenderness Neuro: AAO x4, follows commands moving extremities x 4 Skin: No rash   Labs/imaging that I havepersonally reviewed  (right click and "Reselect all SmartList Selections" daily)  K 3.6 BUN/Cr 46/1.28  Imaging, labs and test noted above have been  reviewed independently by me.  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 w/ associated Acute hypoxic respiratory failure felt due to restrictive lung disease from kyphoscoliosis and ankylosing spondylitis, and obstructive lung disease in the setting of COPD  Placed on trach collar this morning, tolerating well so far We will continue trach collar as long as he tolerates even at night Trach care VAP  Klebsiella pneumonia Small loculated left effusion Completed treatment with IV ceftriaxone Hold further diuretics as patient looks euvolemic to slight hypovolemic Continue yupelri and Pulmicort VAP  Acute kidney injury due to aggressive diuresis and diarrhea Serum creatinine slowly trended up from 0.9-1.2 Holding diuretics Closely watch fluid balance  Afib & HTN>had AF w/ RVR but now NSR On eliquis at home Cont tele  Continue home Atenolol, HCTZ and scheduled dilt (took this PRN at home) On home eliquis 4/15. Discussed with Neurosurgery team  Status post stabilization Chance fracture T11-T12 Post-op care per neurosurgery Continue PT/OT  Remote history of lung cancer status post lung resection of left upper lobe.  Has known nodule left lower lobe since 2021 which has been under observation Outpt CT and Onc follow up   Generalized frailty with recent 40 pound weight loss over the last 6 to 12 months.  Certainly raising consideration for return to malignancy OP follow up   Hypothyroidism Continue Synthroid   Diarrhea secondary to TF C.diff and GI panel neg  Added fiber and loperamide  Urinary retention w/ difficult foley insertion requiring coude cath Plan Urecholine at 15m qid can titrate if needed   Best Practice (right click and "Reselect all SmartList Selections" daily)   Diet:  Tube Feed   Pain/Anxiety/Delirium protocol (if indicated): No VAP protocol (if indicated): Yes DVT prophylaxis:  Heparin GI prophylaxis: PPI Glucose control:  SSI Yes and Basal insulin Yes Central venous access: NA Arterial line:  N/A Foley:  N/A Condom cath Mobility:  OOB  PT consulted Code Status:  full code  Patient's wife was updated at bedside  Disposition: Continue present management   Total critical care time: 33 minutes  Performed by: SJacky Kindle  Critical care time was exclusive of separately billable procedures and treating other patients.   Critical care was necessary to treat or prevent imminent or life-threatening deterioration.   Critical care was time spent personally by me on the following activities: development of treatment plan with patient and/or surrogate as well as nursing, discussions with consultants, evaluation of patient's response to treatment, examination of patient, obtaining history from patient or surrogate, ordering and performing treatments and interventions, ordering and review of laboratory studies, ordering and review of radiographic studies, pulse oximetry and re-evaluation of patient's condition.   SJacky KindleMD [facility]monary Critical Care See Amion for pager If no response to pager, please call [phone] 7pm After 7pm, Please call E-link [phone]

## 2020-09-25 NOTE — Progress Notes (Signed)
  Speech Language Pathology Treatment: [name] Speaking valve  Patient Details Name: Devin [name] MRN: [id number] DOB: February 01, 1941 Today's Date: 09/25/2020 Time: 7703-4035 SLP Time Calculation (min) (ACUTE ONLY): 25.25 min  Assessment / Plan / Recommendation Clinical Impression  Pt doing well tolerating trach collar for longer periods of time. This morning he is doing well on TC- wife is at bedside. SP02 100%, HR 65, RR 25 at baseline; cuff deflated at baseline.  Placed PMV - pt utilized for 15 minutes with excellent toleration and is still wearing valve while RN is present.  He may use the valve when he can be fully supervised by staff.  Voice today is still weak, with low volume, but quality is improved. Pt's communication is 80% intelligible, with intermittent verbal cues needed to increase breath support to improve volume and length-of-utterance. Pt responsive to cueing. 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  We discussed plan for PMV use as well as plan to proceed with swallowing assessment later this week.  Pt/wife verbalize understanding.    HPI HPI: 80 yo male admitted s/p fall 3/31 and obtained T11 chance fracture, stable L1 compression fracture, extensive lumbar spondylosis and postsurgical changes, left 7th rib fx anteriorly, left pleural effusion. Intubated 4/3 and 4/8 s/p rod connection for T11 fx. 4/9 failed attempt extubation. Trach on 4/12 trach placement.  PMH restrictive lung disease, emphysema/COPD adenocarcinoma s/p LUL lobectomy Crohn's, Psoriasis, Afib, nephrolithiasis, HTN, DM, obesity, thyroid cancer s/p RAI and total thyroidectomy (1999), and prostate cancer s/p prostatectomy (2012) but with recurrence      SLP Plan  Continue with current plan of care       Recommendations         Patient may use Passy-Muir Speech Valve: During all therapies with supervision;Intermittently with supervision PMSV Supervision:  Full         Oral Care Recommendations: Oral care QID Follow up Recommendations: Inpatient Rehab SLP Visit Diagnosis: Aphonia (R49.1) Plan: Continue with current plan of care       GO                Assunta Curtis 09/25/2020, 10:38 AM   Estill Bamberg L. Tivis Ringer, [place] Office number [phone]

## 2020-09-25 NOTE — Progress Notes (Signed)
Nutrition Follow-up  DOCUMENTATION CODES:   Not applicable  INTERVENTION:    Tube feeding via Cortrak tube: Osmolite 1.5 at 55 ml/h (1320 ml per day) Prosource TF 45 ml TID  Provides 2100 kcal, 115 gm protein, 1003 ml free water daily  -1 packet Juven BID, each packet provides 95 calories, 2.5 grams of protein (collagen), and 9.8 grams of carbohydrate (3 grams sugar); also contains 7 grams of L-arginine and L-glutamine, 300 mg vitamin C, 15 mg vitamin E, 1.2 mcg vitamin B-12, 9.5 mg zinc, 200 mg calcium, and 1.5 g  Calcium Beta-hydroxy-Beta-methylbutyrate to support wound healing  100 ml free water every 6 hours Total free water: 1403 ml  NUTRITION DIAGNOSIS:   Inadequate oral intake related to inability to eat as evidenced by NPO status. Ongoing.   GOAL:   Patient will meet greater than or equal to 90% of their needs Met with TF.   MONITOR:   Vent status,TF tolerance,Labs,Weight trends  REASON FOR ASSESSMENT:   Ventilator,Consult Enteral/tube feeding initiation and management  ASSESSMENT:   80 y/o male with medical history of afib, adenocarcinoma of the L lung, ankylosing spondylitis, Crohn's disease, nephrolithiasis, HTN, DM, obesity, osteoporosis, prostate cancer, psoriasis, thyroid cancer, and recently dx COPD. He presented to the ED due to back pain. He experienced a fall on 3/31; seen by his PCP for thoracic spine xray, lumbar spine xray, and CXR of the L ribs. He has had increased back pain since the fall and having nausea, dry heaving, and no vomiting episodes. He is not on chemo at this time.  Pt discussed during ICU rounds and with RN.  Pt tolerating trach collar this am. Had episodes of gagging overnight now that on trach collar no longer having these sensations. SLP following and plans for swallow eval later in the week.    GI following and started imodium for loose stools, pt unable to receive crohn's medications per tube.   4/03 Intubated 4/09 Failed  Extubation 4/11 Extubated, Re-Intubated, Cortrak placed; tip gastric    4/12 s/p trach  Medications reviewed and include: nutrisource fiber BID, SSI, lantus, imodium  4 mg BID  Labs reviewed: Na 133 CBG's: 116-184  UOP: 2840 ml  I&O: +12 L Weight increased by 11 kg since admission   Mild pitting edema noted   Diet Order:   Diet Order            Diet NPO time specified  Diet effective now                 EDUCATION NEEDS:   No education needs have been identified at this time  Skin:  Skin Assessment:  (skin tears: R forearm, L arm) Skin Integrity Issues:: Stage II Stage II: sacrum, mid lower back  Last BM:  650 ml via rectal tube  Height:   Ht Readings from Last 1 Encounters:  09/10/20 5' 9.02" (1.753 m)    Weight:   Wt Readings from Last 1 Encounters:  09/25/20 94.2 kg    Ideal Body Weight:  72.73 kg  BMI:  Body mass index is 30.65 kg/m.  Estimated Nutritional Needs:   Kcal:  2000-2200  Protein:  100-125 grams  Fluid:  >/= 2 L/day  Lockie Pares., RD, LDN, CNSC See AMiON for contact information

## 2020-09-26 ENCOUNTER — Other Ambulatory Visit: Payer: Medicare Other

## 2020-09-26 ENCOUNTER — Ambulatory Visit: Payer: Medicare Other | Admitting: Internal Medicine

## 2020-09-26 DIAGNOSIS — J9622 Acute and chronic respiratory failure with hypercapnia: Secondary | ICD-10-CM | POA: Diagnosis not present

## 2020-09-26 DIAGNOSIS — S2232XA Fracture of one rib, left side, initial encounter for closed fracture: Secondary | ICD-10-CM | POA: Diagnosis not present

## 2020-09-26 DIAGNOSIS — S22080D Wedge compression fracture of T11-T12 vertebra, subsequent encounter for fracture with routine healing: Secondary | ICD-10-CM | POA: Diagnosis not present

## 2020-09-26 DIAGNOSIS — J9621 Acute and chronic respiratory failure with hypoxia: Secondary | ICD-10-CM | POA: Diagnosis not present

## 2020-09-26 LAB — GLUCOSE, CAPILLARY
Glucose-Capillary: 162 mg/dL — ABNORMAL HIGH (ref 70–99)
Glucose-Capillary: 160 mg/dL — ABNORMAL HIGH (ref 70–99)
Glucose-Capillary: 132 mg/dL — ABNORMAL HIGH (ref 70–99)
Glucose-Capillary: 127 mg/dL — ABNORMAL HIGH (ref 70–99)
Glucose-Capillary: 144 mg/dL — ABNORMAL HIGH (ref 70–99)
Glucose-Capillary: 129 mg/dL — ABNORMAL HIGH (ref 70–99)

## 2020-09-26 LAB — BASIC METABOLIC PANEL
Anion gap: 5 (ref 5–15)
BUN: 50 mg/dL — ABNORMAL HIGH (ref 8–23)
CO2: 37 mmol/L — ABNORMAL HIGH (ref 22–32)
Calcium: 8.3 mg/dL — ABNORMAL LOW (ref 8.9–10.3)
Chloride: 94 mmol/L — ABNORMAL LOW (ref 98–111)
Creatinine, Ser: 1.22 mg/dL (ref 0.61–1.24)
GFR, Estimated: >60 mL/min (ref >60)
Glucose, Bld: 169 mg/dL — ABNORMAL HIGH (ref 70–99)
Potassium: 4.5 mmol/L (ref 3.5–5.1)
Sodium: 136 mmol/L (ref 135–145)

## 2020-09-26 MED ORDER — FREE WATER
200.0000 mL | Freq: Four times a day (QID) | Status: DC
  Administered 2020-09-26 – 2020-10-06 (×37): 200 mL

## 2020-09-26 NOTE — Progress Notes (Signed)
Occupational Therapy Treatment Patient Details Name: Devin [name] MRN: [id number] DOB: 10/22/1940 Today's Date: 09/26/2020    History of present illness 80 yo male who presented 4/3 s/p fell at a bookstore 3/31 and obtained T11 chance fracture, stable L1 compression fracture, extensive lumbar spondylosis and postsurgical changes. Additionally had left 7th rib fx anteriorly and left pleural effusion. Pt intubated in ER on 4/3 for agitation and hypoxemia. 4/8 s/p rod connection for T11 fx. 4/9 failed attempt extubation.  4/12 trach placement intubated 4/3  PMH restrictive lung disease, emphysema/COPD adenocarcinoma s/p LUL lobectomy Crohn's, Psoriasis, Afib, nephrolithiasis, HTN, DM, obesity, thyroid cancer s/p RAI and total thyroidectomy (1999), and prostate cancer s/p prostatectomy (2012) but with recurrence   OT comments  Pt progressing towards established OT goals. Pt continues to present with high motivation. Pt performing functional mobility at [place] with RW. Providing education on LB dressing with AE (attempting figure four method but pt uncomfortable). Pt doffing socks with reacher and Min cues. Pt donning socks with Mod A for managing sock aide and increased time due to fatigue. Continue to recommend dc to CIR and will continue to follow acutely as admitted.     Follow Up Recommendations  CIR    Equipment Recommendations  3 in 1 bedside commode;Other (comment) (RW)    Recommendations for Other Services Rehab consult    Precautions / Restrictions Precautions Precautions: Fall;Other (comment);Back Precaution Booklet Issued: No Precaution Comments: rectal tube, trach collar, foley, NG tube Required Braces or Orthoses: Spinal Brace Spinal Brace: Thoracolumbosacral orthotic;Applied in sitting position Restrictions Weight Bearing Restrictions: No       Mobility Bed Mobility Overal bed mobility: Needs Assistance Bed Mobility: Rolling;Sidelying to Sit Rolling: Min  assist Sidelying to sit: Min assist       General bed mobility comments: Min A for elevating trunk    Transfers Overall transfer level: Needs assistance Equipment used: Rolling walker (2 wheeled) Transfers: Sit to/from [facility] Sit to Stand: Min assist;+2 safety/equipment         General transfer comment: Min A for power up into standing, Min A +2 for gaining balance    Balance Overall balance assessment: Needs assistance Sitting-balance support: Bilateral upper extremity supported;Feet supported;Single extremity supported Sitting balance-Leahy Scale: Fair     Standing balance support: Bilateral upper extremity supported;During functional activity Standing balance-Leahy Scale: Poor Standing balance comment: Reliant on bil UE support and external assist.                           ADL either performed or assessed with clinical judgement   ADL Overall ADL's : Needs assistance/impaired Eating/Feeding: NPO           Lower Body Bathing: Maximal assistance;Sit to/from stand Lower Body Bathing Details (indicate cue type and reason): Max A for washing feet Upper Body Dressing : Maximal assistance;Sitting Upper Body Dressing Details (indicate cue type and reason): Max A to don new brace while sitting at EOB Lower Body Dressing: Moderate assistance;Sit to/from stand;With adaptive equipment Lower Body Dressing Details (indicate cue type and reason): don socks with AE. Pt doffing socks with reacher. Mod A for managing sock aide. Toilet Transfer: Minimal assistance;+2 for physical assistance;+2 for safety/equipment;Ambulation;RW (simulated to recliner) Toilet Transfer Details (indicate cue type and reason): MIn A for support and RW management.         Functional mobility during ADLs: Rolling walker;Cueing for safety;Minimal assistance;+2 for physical assistance;+2 for safety/equipment  General ADL Comments: Pt performing functional mobility in room  and then practicing LB dressing with AE     Vision       Perception     Praxis      Cognition Arousal/Alertness: Awake/alert Behavior During Therapy: WFL for tasks assessed/performed Overall Cognitive Status: Difficult to assess                                 General Comments: Following one step commands. Slightly flat.        Exercises     Shoulder Instructions       General Comments SpO2 dropping to 86% on 5L at 28%; bumped to 8L and 35% for seated recovery and pt returning SPO2 to 94%.    Pertinent Vitals/ Pain       Pain Assessment: Faces Faces Pain Scale: Hurts a little bit Pain Location: with coughing Pain Descriptors / Indicators: Operative site guarding;Discomfort;Grimacing Pain Intervention(s): Monitored during session;Limited activity within patient's tolerance;Repositioned  Home Living                                          Prior Functioning/Environment              Frequency  Min 2X/week        Progress Toward Goals  OT Goals(current goals can now be found in the care plan section)  Progress towards OT goals: Progressing toward goals  Acute Rehab OT Goals Patient Stated Goal: to get up to chair OT Goal Formulation: With patient/family Time For Goal Achievement: 10/04/20 Potential to Achieve Goals: Good ADL Goals Pt Will Perform Grooming: with min assist;sitting Pt Will Perform Upper Body Bathing: with min assist;sitting Pt Will Transfer to Toilet: bedside commode;stand pivot transfer;with +2 assist;with min assist Additional ADL Goal #1: pt will complete bed mobility mod (A) as precursor to adls. Additional ADL Goal #2: pt will don brace min level as precursor to adls.  Plan Discharge plan remains appropriate    Co-evaluation    PT/OT/SLP Co-Evaluation/Treatment: Yes Reason for Co-Treatment: Complexity of the patient's impairments (multi-system involvement);To address functional/ADL transfers    OT goals addressed during session: ADL's and self-care      AM-PAC OT "6 Clicks" Daily Activity     Outcome Measure   Help from another person eating meals?: A Lot Help from another person taking care of personal grooming?: A Lot Help from another person toileting, which includes using toliet, bedpan, or urinal?: A Lot Help from another person bathing (including washing, rinsing, drying)?: A Lot Help from another person to put on and taking off regular upper body clothing?: A Lot Help from another person to put on and taking off regular lower body clothing?: A Lot 6 Click Score: 12    End of Session Equipment Utilized During Treatment: Gait belt;Oxygen;Back brace  OT Visit Diagnosis: Unsteadiness on feet (R26.81);Muscle weakness (generalized) (M62.81)   Activity Tolerance Patient tolerated treatment well   Patient Left with call bell/phone within reach;in chair;with chair alarm set;with family/visitor present   Nurse Communication Mobility status;Precautions        Time: 3532-9924 OT Time Calculation (min): 41 min  Charges: OT General Charges $OT Visit: 1 Visit OT Treatments $Self Care/Home Management : 23-37 mins  [place], OTR/L Acute Rehab Pager: [phone] Office: [phone]  Devin [name] 09/26/2020, 10:44 AM

## 2020-09-26 NOTE — Progress Notes (Signed)
NAME:  Devin [name], MRN:  [id number], DOB:  November 15, 1940, LOS: 7 ADMISSION DATE:  09/10/2020, CONSULTATION DATE:  09/12/20 REFERRING MD:  [facility], CHIEF COMPLAINT:  T11-12 Chance Fracture c/b acute on chronic hypercapnic RF   History of Present Illness:  Devin [name] is a 80 y.o. male here for medical stabilization prior to pursuing spine stabilization neurosurgery (connecting prior T7-11 and T11-L4 rods) following a fall with a relevant PMHx of restrictive lung disease, emphysema/COPD, adenocarcinoma s/p LUL lobectomy (2016), ankylosing spondylitis, Crohn's, Afib, nephrolithiasis, HTN, DM, obesity, and thyroid and prostate cancer (see below). Per chart review, patient fell at a bookstore 3/31. New onset back pain - denied head trauma or LOC at that time. Saw PCP on 4/1 and underwent thoracic, lumbar, and chest x-ray. Found to have new T11 Chance fracture.He also has a stable L1 compression fracture, extensive lumbar spondylosis and postsurgical changes. Additionally had a left seventh rib fracture anteriorly and a left pleural effusion without any obvious pneumothorax. Patient was intubated in the ER on 4/3 for agitation and hypoxemia. Was transferred to [facility] for cardiopulmonary stabilization and eventual neurosurgical intervention. Neurosurgery intervention planned for today, 09/15/20.  Significant Hospital Events: Including procedures, antibiotic start and stop dates in addition to other pertinent events    4/3 - Intubated in ER for agitation   4/3 - Resp viral panel PCR Neg  4/3 - Respiratory culture from ET sent  4/3 - Blood Cx, negative at 48 hrs   4/6 - Min Vent requirements, HDS  4/8 - Neurosurgery plans to link prior rod systems across T11 Fx  4/9- failed attempt extubation. Decadron X 4 doses for airway edema   4/10 IVFs increased for mild Cr bump  4/11 cr improved w/ IVFs.  passed SBT. Had ETT cuff leak so extubated. Failed extubation 2/2 progressive weakness, worse  cough mechanics (essentially just fatigued and progressed to respiratory failure) re-intubated. Sputum sent post re-intubation   4/12 trach at bedside by Kary Kos and Agarwala. Sputum sent. Having bursts of AF  4/13 sputum growing Klebsiella. Low gd temp/ongoing leukocytosis so Ceftriaxone started. GI consulted for diarrhea in context of known chron's   4/14 Tolerated trach collar for 5 hours. Returned to vent after PT  4/15 Diuresis.  4/16 Tolerated 10 hours of trach collar with break in between  4/18 tolerated trach collar for more than 24 hours  Interim History / Subjective:  Patient tolerated trach collar for more than 24 hours now, on 28% oxygen He is in good spirit, working with PT  Objective   Blood pressure (!) 111/50, pulse 64, temperature 98.7 F (37.1 C), temperature source Oral, resp. rate (!) 29, height 5' 9.02" (1.753 m), weight 94 kg, SpO2 97 %.    FiO2 (%):  [28 %] 28 %   Intake/Output Summary (Last 24 hours) at 09/26/2020 0921 Last data filed at 09/26/2020 0600 Gross per 24 hour  Intake 1782.57 ml  Output 1625 ml  Net 157.57 ml   Filed Weights   09/24/20 0400 09/25/20 0400 09/26/20 0425  Weight: 92 kg 94.2 kg 94 kg   Physical Exam: General: Chronically ill-appearing, lying in the bed HENT: [place], AT, OP clear, MMM, s/p trach on trach collar Eyes: EOMI, no scleral icterus Respiratory: Diminished breath sounds bilaterally.  No crackles, wheezing or rales Cardiovascular: RRR, -M/R/G, no JVD GI: BS+, soft, nontender Neuro: AAO x4, follows commands moving extremities x 4 Skin: No rash   Labs/imaging that I have personally reviewed  (right  click and "Reselect all SmartList Selections" daily)   BUN/Cr 50/1.22  Imaging, labs and test noted above have been reviewed independently by me.  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 w/ associated Acute hypoxic respiratory failure felt due to  restrictive lung disease from kyphoscoliosis and ankylosing spondylitis, and obstructive lung disease in the setting of COPD  Patient tolerated trach collar for more than 24 hours now He is on 28% oxygen with O2 sat about 95% most of the time ContinueTrach care  Klebsiella pneumonia Small loculated left effusion Completed treatment with IV ceftriaxone Continue to hold diuretic as patient looks slightly volume down Started on free water through G-tube Continue yupelri and Pulmicort  Acute kidney injury due to aggressive diuresis and diarrhea Serum creatinine slowly trended up from 0.9-1.2 Holding diuretics Closely watch fluid balance  Paroxysmal Afib & HTN>had AF w/ RVR but now NSR On eliquis at home Continue home Atenolol and scheduled dilt (took this PRN at home) Holding HCTZ because of hypovolemia  Status post stabilization Chance fracture T11-T12 Post-op care per neurosurgery Continue PT/OT  Remote history of lung cancer status post lung resection of left upper lobe.  Has known nodule left lower lobe since 2021 which has been under observation Outpt CT and Onc follow up   Generalized frailty with recent 40 pound weight loss over the last 6 to 12 months.  Certainly raising consideration for return to malignancy OP follow up   Hypothyroidism Continue Synthroid   Diarrhea secondary to TF, slowly improving with addition of loperamide Added fiber and loperamide  Urinary retention w/ difficult foley insertion requiring coude cath Urecholine at 37m qid can titrate if needed Foley was taken out   Best Practice (right click and "Reselect all SmartList Selections" daily)   Diet:  Tube Feed   Pain/Anxiety/Delirium protocol (if indicated): No VAP protocol (if indicated): Yes DVT prophylaxis: Apixaban GI prophylaxis: N/A Glucose control:  SSI Yes and Basal insulin Yes Central venous access: NA Arterial line:  N/A Foley:  N/A Condom cath Mobility:  OOB  PT consulted Code  Status:  full code  Patient's daughter was updated at bedside Disposition: Progressive care unit     SJacky KindleMD [place]ew[place]for pager If no response to pager, please call [phone] 7pm After 7pm, Please call E-link [phone]

## 2020-09-26 NOTE — Plan of Care (Signed)

## 2020-09-26 NOTE — Progress Notes (Signed)
Physical Therapy Treatment Patient Details Name: Devin [name] MRN: [id number] DOB: 04/19/1941 Today's Date: 09/26/2020    History of Present Illness 80 yo male who presented 4/3 s/p fell at a bookstore 3/31 and obtained T11 chance fracture, stable L1 compression fracture, extensive lumbar spondylosis and postsurgical changes. Additionally had left 7th rib fx anteriorly and left pleural effusion. Pt intubated in ER on 4/3 for agitation and hypoxemia. 4/8 s/p rod connection for T11 fx. 4/9 failed attempt extubation.  4/12 trach placement intubated 4/3.  PMH restrictive lung disease, emphysema/COPD adenocarcinoma s/p LUL lobectomy Crohn's, Psoriasis, Afib, nephrolithiasis, HTN, DM, obesity, thyroid cancer s/p RAI and total thyroidectomy (1999), and prostate cancer s/p prostatectomy (2012) but with recurrence    PT Comments    Pt resting in bed upon PT and OT arrival to room, agreeable to mobility to reach recliner. Pt requiring min +2 overall for bed mobility, transition to stand, and short-distance gait, pt limited by LE weakness, deconditioning, and SpO2 drop to 80% (requires 8L/35% to recover)/tachypnea to 40. Pt progressing slowly, would continue to benefit from CIR level of therapies.     Follow Up Recommendations  CIR     Equipment Recommendations  3in1 (PT);Rolling walker with 5" wheels    Recommendations for Other Services       Precautions / Restrictions Precautions Precautions: Fall;Other (comment);Back Precaution Booklet Issued: No Precaution Comments: rectal tube, trach collar, foley, NG tube Required Braces or Orthoses: Spinal Brace Spinal Brace: Thoracolumbosacral orthotic;Applied in sitting position Restrictions Weight Bearing Restrictions: No    Mobility  Bed Mobility Overal bed mobility: Needs Assistance Bed Mobility: Rolling;Sidelying to Sit Rolling: Min assist Sidelying to sit: Min assist       General bed mobility comments: Min A for elevating trunk     Transfers Overall transfer level: Needs assistance Equipment used: Rolling walker (2 wheeled) Transfers: Sit to/from [facility] Sit to Stand: Min assist;+2 safety/equipment         General transfer comment: Min A for power up into standing, Min A +2 for gaining balance  Ambulation/Gait Ambulation/Gait assistance: Min assist;+2 physical assistance;+2 safety/equipment Gait Distance (Feet): 10 Feet Assistive device: Rolling walker (2 wheeled) Gait Pattern/deviations: Step-through pattern;Decreased stride length;Shuffle;Trunk flexed;Narrow base of support Gait velocity: decr   General Gait Details: min assist +2 for steadying, managing RW, lines/leads; VC for upright posture, placement in RW   Stairs             Wheelchair Mobility    Modified Rankin (Stroke Patients Only)       Balance Overall balance assessment: Needs assistance Sitting-balance support: Bilateral upper extremity supported;Feet supported;Single extremity supported Sitting balance-Leahy Scale: Fair     Standing balance support: Bilateral upper extremity supported;During functional activity Standing balance-Leahy Scale: Poor Standing balance comment: Reliant on bil UE support and external assist.                            Cognition Arousal/Alertness: Awake/alert Behavior During Therapy: WFL for tasks assessed/performed Overall Cognitive Status: Difficult to assess                                 General Comments: Following one step commands. Slightly flat, borderline irritable during certain tasks. Pt does make jokes occasionally during session      Exercises      General Comments General comments (skin integrity, edema, etc.):  Pt on 5L/28% upon PT/OT arrival to room. SpO2 dropped to 80% on RA during gait, requiring 8L/35% to recover SpO2 to 90s%.      Pertinent Vitals/Pain Pain Assessment: Faces Faces Pain Scale: Hurts little more Pain  Location: back Pain Descriptors / Indicators: Discomfort;Grimacing Pain Intervention(s): Limited activity within patient's tolerance;Monitored during session;Repositioned    Home Living                      Prior Function            PT Goals (current goals can now be found in the care plan section) Acute Rehab PT Goals Patient Stated Goal: to get up to chair PT Goal Formulation: With patient Time For Goal Achievement: 10/04/20 Potential to Achieve Goals: Good Progress towards PT goals: Progressing toward goals    Frequency    Min 5X/week      PT Plan Current plan remains appropriate    Co-evaluation PT/OT/SLP Co-Evaluation/Treatment: Yes Reason for Co-Treatment: For patient/therapist safety;To address functional/ADL transfers PT goals addressed during session: Mobility/safety with mobility;Balance;Proper use of DME        AM-PAC PT "6 Clicks" Mobility   Outcome Measure  Help needed turning from your back to your side while in a flat bed without using bedrails?: A Little Help needed moving from lying on your back to sitting on the side of a flat bed without using bedrails?: A Little Help needed moving to and from a bed to a chair (including a wheelchair)?: A Little Help needed standing up from a chair using your arms (e.g., wheelchair or bedside chair)?: A Little Help needed to walk in hospital room?: A Little Help needed climbing 3-5 steps with a railing? : A Lot 6 Click Score: 17    End of Session Equipment Utilized During Treatment: Oxygen;Back brace Activity Tolerance: Patient tolerated treatment well Patient left: in bed;with bed alarm set;with call bell/phone within reach Nurse Communication: Mobility status PT Visit Diagnosis: Pain;Difficulty in walking, not elsewhere classified (R26.2);Unsteadiness on feet (R26.81);Other abnormalities of gait and mobility (R26.89);Muscle weakness (generalized) (M62.81) Pain - part of body:  (back)     Time:  0920-1000 PT Time Calculation (min) (ACUTE ONLY): 40 min  Charges:  $Gait Training: 8-22 mins                    Stacie Glaze, PT DPT Acute Rehabilitation Services Pager [phone]  Office [phone]     [place] 09/26/2020, 1:00 PM

## 2020-09-27 DIAGNOSIS — Z859 Personal history of malignant neoplasm, unspecified: Secondary | ICD-10-CM | POA: Diagnosis not present

## 2020-09-27 DIAGNOSIS — J9621 Acute and chronic respiratory failure with hypoxia: Secondary | ICD-10-CM | POA: Diagnosis not present

## 2020-09-27 DIAGNOSIS — Z930 Tracheostomy status: Secondary | ICD-10-CM | POA: Diagnosis not present

## 2020-09-27 DIAGNOSIS — I4891 Unspecified atrial fibrillation: Secondary | ICD-10-CM

## 2020-09-27 DIAGNOSIS — S22080A Wedge compression fracture of T11-T12 vertebra, initial encounter for closed fracture: Secondary | ICD-10-CM

## 2020-09-27 DIAGNOSIS — M545 Low back pain, unspecified: Secondary | ICD-10-CM

## 2020-09-27 DIAGNOSIS — J449 Chronic obstructive pulmonary disease, unspecified: Secondary | ICD-10-CM

## 2020-09-27 DIAGNOSIS — E119 Type 2 diabetes mellitus without complications: Secondary | ICD-10-CM

## 2020-09-27 DIAGNOSIS — G8929 Other chronic pain: Secondary | ICD-10-CM

## 2020-09-27 DIAGNOSIS — J9622 Acute and chronic respiratory failure with hypercapnia: Secondary | ICD-10-CM | POA: Diagnosis not present

## 2020-09-27 LAB — GLUCOSE, CAPILLARY
Glucose-Capillary: 166 mg/dL — ABNORMAL HIGH (ref 70–99)
Glucose-Capillary: 109 mg/dL — ABNORMAL HIGH (ref 70–99)
Glucose-Capillary: 156 mg/dL — ABNORMAL HIGH (ref 70–99)
Glucose-Capillary: 180 mg/dL — ABNORMAL HIGH (ref 70–99)

## 2020-09-27 NOTE — Progress Notes (Signed)
Inpatient Rehabilitation Admissions Coordinator  I met at bedside with patient and his daughter. We discussed goals and expectations of a possible CIR admit. Daughter can assist as his caregiver at discharge. I will begin insurance authorization with [facility] for a possible CIR admit.  Danne Baxter, RN, MSN Rehab Admissions Coordinator [phone] 09/27/2020 12:55 PM

## 2020-09-27 NOTE — Progress Notes (Addendum)
Trach sutures were removed by RT per protocol. No complications noted.

## 2020-09-27 NOTE — Progress Notes (Signed)
PROGRESS NOTE  Devin [name]  DOB: 1940/06/24  PCP: Prince Solian, MD MAU:[id number]  DOA: 09/10/2020  LOS: 17 days   Chief Complaint  Patient presents with  . Back Pain  . Vomiting   Brief narrative: Devin [name] is a 80 y.o. male with PMH significant for DM2, HTN, A. 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  On 3/31, patient fell while shopping at a bookstore.  No head trauma or LOC but had a new onset back pain.  4/1, seen by PCP and underwent thoracic, lumbar, and chest x-ray. Found to have new T11 Chance fracture.He also has a stable L1 compression fracture, extensive lumbar spondylosis and postsurgical changes. Additionally hada left seventh rib fracture anteriorly and a left pleural effusion without any obvious pneumothorax.  Pain control was tried as an outpatient with hydrocodone and Robaxin but it became intolerable.  Additionally, he was not able to use his incentive spirometer because of chest pain from rib fracture.  He continued to have progressive shortness of breath and hence presented to ED on 4/3 with excruciating pain, agitation, shortness of breath.  In the ED, patient required intubation and admitted to ICU. Seen by neurosurgery.  Significant Hospital events  4/8, neurosurgery did laking of the prior rod systems (T7-11 and T11-L4 rods) across T11 fracture.  4/9- failed attempt extubation. Decadron X 4 doses for airway edema   4/10 IVFs increased for mild Cr bump  4/11 Failed extubation 2/2 progressive weakness, worse cough mechanics (essentially just fatigued and progressed to respiratory failure) re-intubated. Sputum sent post re-intubation   4/12 trach at bedside.   4/13 sputum grew Klebsiella. Low gd temp/ongoing leukocytosis so Ceftriaxone started. GI consulted for  diarrhea in context of known chron's   4/14-4/18 -gradually transition to trach collar  4/20 transferred out to hospitalist service  Subjective: Patient was seen and examined this morning.  Pleasant Caucasian male lying on bed.  Has NG to feeding ongoing.  Nods head and moves lips to answer questions.  Oriented to place, person and time. 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  Reviewed extubation trials in ICU.  Underwent tracheostomy on 4/12 -Currently on 5 L oxygen by tracheostomy tube -Continue tracheostomy care  Klebsiella pneumonia Small loculated left effusion -Completed treatment with IV ceftriaxone -Continue yupelri and Pulmicort  Acute kidney injury  -Likely due to aggressive diuresis and diarrhea -Creatinine trend as below.  Repeat labs tomorrow. -Diuretics on hold. -On free water 200 mL every 6 hours through G-tube. -Closely watch fluid balance Recent Labs    09/17/20 0415 09/18/20 0532 09/19/20 0420 09/20/20 0453 09/21/20 0801 09/22/20 0317 09/23/20 0153 09/24/20 0328 09/25/20 0445 09/26/20 0337  BUN 30* 37* 31* 26* 20 24* 34* 42* 46* 50*  CREATININE 1.44* 1.24 1.14 0.99 0.95 1.02 1.12 1.11 1.28* 1.22   Paroxysmal Afib  -Was in RVR intermittently in the hospital. -Currently on normal sinus rhythm on atenolol and diltiazem.  Essential hypertension -Blood pressure controlled on atenolol and diltiazem.  Diuretics on hold.  Type 2 diabetes mellitus -A1c 6.4 on 4/3. -Currently blood sugar controlled on Lantus 10 units twice daily and every 4 hours sliding scale insulin. Recent Labs  Lab 09/26/20 1941 09/26/20 2249 09/27/20 0449 09/27/20 0754 09/27/20 1159  GLUCAP 144* 129* 166*  109* 156*   Acute diarrhea History of Crohn's disease -Recurrence of diarrhea likely because of tube feeding.  Currently improving after addition of Imodium and fiber.   -Has a Flexi-Seal tube in place -Monitor diarrhea frequency.  T11-T12 Chance fracture -Status postsurgical stabilization by neurosurgery -Continue PT/OT  Chronic gait unsteadiness History of ankylosing spondylitis, kyphoscoliosis -Continue PT/OT  History of adenocarcinoma s/p LUL lobectomy (2016) History of thyroid and prostate cancer Generalized frailty, recent 40 pound weight loss over 6 to 12 months -Raises concern for return of malignancy. -Continue outpatient follow-up with pulmonology.  Hypothyroidism Continue Synthroid   Nutrition -Currently has tube feeding through Dobbhoff.  Speech therapy evaluation was done few days ago.  Noted a plan to revisit him again later this week.  May need a consideration of PEG tube placement if fails speech evaluation.  Impaired mobility -PT eval obtained.  CIR recommended.  Mobility: CIR recommended Code Status:   Code Status: Full Code  Nutritional status: Body mass index is 26.94 kg/m. Nutrition Problem: Inadequate oral intake Etiology: inability to eat Signs/Symptoms: NPO status Diet Order            Diet NPO time specified  Diet effective now                 DVT prophylaxis: SCD's Start: 09/15/20 1734 apixaban (ELIQUIS) tablet 5 mg   Antimicrobials:  None Fluid: Free water through tube feeding.  Not on IV fluid. Consultants: Critical care Family Communication:  Not at bedside  Status is: Inpatient  Remains inpatient appropriate because: Pending evaluation by CIR  Dispo: The patient is from: Home              Anticipated d/c is to: CIR              Patient currently is not medically stable to d/c.   Difficult to place patient No    Infusions:  . sodium chloride 250 mL (09/23/20 1109)  . feeding supplement (OSMOLITE 1.5 CAL) 1,000 mL  (09/26/20 1519)    Scheduled Meds: . acetaminophen (TYLENOL) oral liquid 160 mg/5 mL  500 mg Per Tube Q6H  . apixaban  5 mg Per Tube BID  . atenolol  25 mg Per NG tube BID  . bethanechol  5 mg Per NG tube QID  . budesonide (PULMICORT) nebulizer solution  0.5 mg Nebulization BID  . busPIRone  15 mg Per Tube BID  . chlorhexidine gluconate (MEDLINE KIT)  15 mL Mouth Rinse BID  . Chlorhexidine Gluconate Cloth  6 each Topical Q0600  . diltiazem  30 mg Per Tube Q6H  . feeding supplement (PROSource TF)  45 mL Per Tube TID  . fiber  1 packet Per Tube BID  . free water  200 mL Per Tube Q6H  . Gerhardt's butt cream   Topical BID  . insulin aspart  0-20 Units Subcutaneous Q4H  . insulin glargine  10 Units Subcutaneous BID  . levothyroxine  125 mcg Per Tube QAC breakfast  . loperamide HCl  4 mg Per Tube BID  . mouth rinse  15 mL Mouth Rinse 10 times per day  . nutrition supplement (JUVEN)  1 packet Per Tube BID BM  . revefenacin  175 mcg Nebulization Daily    Antimicrobials: Anti-infectives (From admission, onward)   Start     Dose/Rate Route Frequency Ordered Stop   09/20/20 1000  cefTRIAXone (ROCEPHIN) 2 g in sodium chloride 0.9 % 100 mL IVPB        2 g 200 mL/hr over 30 Minutes Intravenous Every 24 hours 09/20/20 0821 09/25/20 0755   09/15/20 1830  ceFAZolin (ANCEF) IVPB 2g/100 mL premix        2 g 200 mL/hr over 30 Minutes Intravenous Every 8 hours 09/15/20 1733 09/16/20 0232   09/15/20 1616  vancomycin (VANCOCIN) powder  Status:  Discontinued          As needed 09/15/20 1616 09/15/20 1648   09/15/20 0800  ceFAZolin (ANCEF) IVPB 2g/100 mL premix        2 g 200 mL/hr over 30 Minutes Intravenous To Short Stay 09/15/20 0704 09/15/20 1515   09/10/20 1300  ceFEPIme (MAXIPIME) 2 g in sodium chloride 0.9 % 100 mL IVPB  Status:  Discontinued        2 g 200 mL/hr over 30 Minutes Intravenous  Once 09/10/20 1245 09/10/20 1403   09/10/20 1300  metroNIDAZOLE (FLAGYL) IVPB 500 mg  Status:   Discontinued        500 mg 100 mL/hr over 60 Minutes Intravenous  Once 09/10/20 1245 09/10/20 1403   09/10/20 1300  vancomycin (VANCOCIN) IVPB 1000 mg/200 mL premix  Status:  Discontinued        1,000 mg 200 mL/hr over 60 Minutes Intravenous  Once 09/10/20 1245 09/10/20 1415      PRN meds: sodium chloride, acetaminophen **OR** acetaminophen, ALPRAZolam, loperamide HCl, midazolam, midazolam, ondansetron (ZOFRAN) IV, oxyCODONE, phenol   Objective: Vitals:   09/27/20 1143 09/27/20 1204  BP:    Pulse: 73   Resp: (!) 29   Temp:  98.1 F (36.7 C)  SpO2: 98%     Intake/Output Summary (Last 24 hours) at 09/27/2020 1316 Last data filed at 09/27/2020 0700 Gross per 24 hour  Intake 100 ml  Output 100 ml  Net 0 ml   Filed Weights   09/25/20 0400 09/26/20 0425 09/27/20 0500  Weight: 94.2 kg 94 kg 82.8 kg   Weight change: -11.2 kg Body mass index is 26.94 kg/m.   Physical Exam: General exam: Pleasant, elderly Caucasian male.  Not in distress Skin: No rashes, lesions or ulcers. HEENT: Atraumatic, normocephalic, no obvious bleeding.  Dobbhoff tube in place Lungs: Clear to auscultation bilaterally CVS: Regular rate and rhythm, no murmur GI/Abd soft, nontender, nondistended, bowel sound present CNS: Nods his head and moves lips to answer questions.  Alert, awake, oriented x3 Psychiatry: Mood appropriate Extremities: No pedal edema, no calf tenderness  Data Review: I have personally reviewed the laboratory data and studies available.  Recent Labs  Lab 09/21/20 0232 09/22/20 0317 09/23/20 0153  WBC 17.3* 15.6* 13.7*  NEUTROABS 14.8* 12.9*  --   HGB 9.8* 9.8* 9.4*  HCT 30.2* 29.8* 29.2*  MCV 94.1 91.7 94.8  PLT 323 325 309   Recent Labs  Lab 09/22/20 0317 09/23/20 0153 09/24/20 0328 09/25/20 0445 09/26/20 0337  NA 131* 136 136 133* 136  K 3.6 4.2 3.8 3.6 4.5  CL 98 96* 95* 91* 94*  CO2 27 33* 32 33* 37*  GLUCOSE 185* 167* 129* 132* 169*  BUN 24* 34* 42* 46* 50*   CREATININE 1.02 1.12 1.11 1.28* 1.22  CALCIUM 7.8* 8.3* 8.3* 8.5* 8.3*    F/u labs ordered Unresulted Labs (From admission, onward)          Start     Ordered   09/28/20 0500  CBC with  Differential/Platelet  Tomorrow morning,   R       Question:  Specimen collection method  Answer:  Lab=Lab collect   09/27/20 0915   09/28/20 0500  Comprehensive metabolic panel  Tomorrow morning,   R       Question:  Specimen collection method  Answer:  Lab=Lab collect   09/27/20 0915   09/28/20 0500  Magnesium  Tomorrow morning,   STAT       Question:  Specimen collection method  Answer:  Lab=Lab collect   09/27/20 0915   09/28/20 0500  Phosphorus  Tomorrow morning,   R       Question:  Specimen collection method  Answer:  Lab=Lab collect   09/27/20 0915          Signed, Terrilee Croak, MD [facility] 09/27/2020

## 2020-09-27 NOTE — Progress Notes (Signed)
Moving legs well, wound covered by skin care pads. Following. CIR consulted.

## 2020-09-27 NOTE — Care Management Important Message (Signed)
Important Message  Patient Details  Name: Devin [name] MRN: [id number] Date of Birth: June 10, 1941   Medicare Important Message Given:  Yes     Orbie Pyo 09/27/2020, 2:49 PM

## 2020-09-27 NOTE — Progress Notes (Signed)
Physical Therapy Treatment Patient Details Name: Devin [name] MRN: [id number] DOB: June 13, 1940 Today's Date: 09/27/2020    History of Present Illness 80 yo male who presented 4/3 s/p fell at a bookstore 3/31 and obtained T11 chance fracture, stable L1 compression fracture, extensive lumbar spondylosis and postsurgical changes. Additionally had left 7th rib fx anteriorly and left pleural effusion. Pt intubated in ER on 4/3 for agitation and hypoxemia. 4/8 s/p rod connection for T11 fx. 4/9 failed attempt extubation.  4/12 trach placement intubated 4/3.  PMH restrictive lung disease, emphysema/COPD adenocarcinoma s/p LUL lobectomy Crohn's, Psoriasis, Afib, nephrolithiasis, HTN, DM, obesity, thyroid cancer s/p RAI and total thyroidectomy (1999), and prostate cancer s/p prostatectomy (2012) but with recurrence    PT Comments    Pt is very motivated to participate and improve but continues to be limited by lower extremity muscular weakness and endurance deficits along with aerobic endurance deficits, needing to sit and rest after short bedroom distance gait bouts using a RW. Once sitting to rest he takes extended periods of time to regain his breathe due to bouts of coughing encouraged by mobility. Pt continues to require cues to maintain spinal precautions with all functional mobility. Will continue to follow acutely. Current recommendations remain appropriate.   Follow Up Recommendations  CIR     Equipment Recommendations  3in1 (PT);Rolling walker with 5" wheels    Recommendations for Other Services       Precautions / Restrictions Precautions Precautions: Fall;Other (comment);Back Precaution Booklet Issued: No Precaution Comments: rectal tube, trach collar, foley, NG tube Required Braces or Orthoses: Spinal Brace Spinal Brace: Thoracolumbosacral orthotic;Applied in sitting position Restrictions Weight Bearing Restrictions: No    Mobility  Bed Mobility Overal bed mobility: Needs  Assistance Bed Mobility: Rolling;Sidelying to Sit Rolling: Min assist Sidelying to sit: Min assist       General bed mobility comments: Min A for elevating trunk, cues to log roll.    Transfers Overall transfer level: Needs assistance Equipment used: Rolling walker (2 wheeled) Transfers: Sit to/from Stand Sit to Stand: Min assist;+2 safety/equipment         General transfer comment: Min A for power up into standing, Min A +2 for gaining balance and for hand placement on RW.  Ambulation/Gait Ambulation/Gait assistance: Min assist;+2 physical assistance;+2 safety/equipment Gait Distance (Feet): 10 Feet Assistive device: Rolling walker (2 wheeled) Gait Pattern/deviations: Step-through pattern;Decreased stride length;Shuffle;Trunk flexed;Narrow base of support Gait velocity: decr Gait velocity interpretation: <1.31 ft/sec, indicative of household ambulator General Gait Details: min assist +2 for steadying, managing RW, lines/leads; VC for upright posture and repeated VC for placement in RW as he tends to get distal to it; pt stopping during turn and requesting for chair quickly, pt returned to sit safely in chair with him requesting to rest to end session   Stairs             Wheelchair Mobility    Modified Rankin (Stroke Patients Only)       Balance Overall balance assessment: Needs assistance Sitting-balance support: Bilateral upper extremity supported;Feet supported;Single extremity supported Sitting balance-Leahy Scale: Fair Sitting balance - Comments: Pt with preference for UE support on bed.   Standing balance support: Bilateral upper extremity supported;During functional activity Standing balance-Leahy Scale: Poor Standing balance comment: Reliant on bil UE support and external assist.                            Cognition Arousal/Alertness: Awake/alert Behavior  During Therapy: WFL for tasks assessed/performed Overall Cognitive Status:  Difficult to assess                                 General Comments: Following one step commands. Slightly flat, borderline irritable during certain tasks. Possible anxiety with progressing gait distance.      Exercises      General Comments General comments (skin integrity, edema, etc.): Educated pt to perform LAQ and partial seated marching (<90 degrees hip flexion) to encourage lower extremity strengthening for mobility      Pertinent Vitals/Pain Pain Assessment: Faces Faces Pain Scale: Hurts even more Pain Location: back, generalized Pain Descriptors / Indicators: Discomfort;Grimacing;Guarding Pain Intervention(s): Limited activity within patient's tolerance;Monitored during session;Repositioned    Home Living                      Prior Function            PT Goals (current goals can now be found in the care plan section) Acute Rehab PT Goals Patient Stated Goal: agreeable to session, to get up PT Goal Formulation: With patient/family Time For Goal Achievement: 10/04/20 Potential to Achieve Goals: Good Progress towards PT goals: Progressing toward goals    Frequency    Min 5X/week      PT Plan Current plan remains appropriate    Co-evaluation              AM-PAC PT "6 Clicks" Mobility   Outcome Measure  Help needed turning from your back to your side while in a flat bed without using bedrails?: A Little Help needed moving from lying on your back to sitting on the side of a flat bed without using bedrails?: A Little Help needed moving to and from a bed to a chair (including a wheelchair)?: A Little Help needed standing up from a chair using your arms (e.g., wheelchair or bedside chair)?: A Little Help needed to walk in hospital room?: A Little Help needed climbing 3-5 steps with a railing? : A Lot 6 Click Score: 17    End of Session Equipment Utilized During Treatment: Oxygen;Back brace Activity Tolerance: Patient tolerated  treatment well Patient left: in chair;with chair alarm set;with call bell/phone within reach;with family/visitor present Nurse Communication: Mobility status PT Visit Diagnosis: Pain;Difficulty in walking, not elsewhere classified (R26.2);Unsteadiness on feet (R26.81);Other abnormalities of gait and mobility (R26.89);Muscle weakness (generalized) (M62.81) Pain - part of body:  (back)     Time: 8341-9622 PT Time Calculation (min) (ACUTE ONLY): 36 min  Charges:  $Gait Training: 8-22 mins $Therapeutic Activity: 8-22 mins                     Moishe Spice, PT, DPT Acute Rehabilitation Services  Pager: [phone] Office: [place] 09/27/2020, 9:03 PM

## 2020-09-27 NOTE — Progress Notes (Signed)
Rehab Admissions Coordinator Note:  Patient was screened by Cleatrice Burke for appropriateness for an Inpatient Acute Rehab Consult per PT and OT recs.  At this time, we are recommending Inpatient Rehab consult. Please place order if you would like patient considered for CIR admit. Please advise.  Cleatrice Burke RN MSN 09/27/2020, 8:33 AM  I can be reached at [phone].

## 2020-09-27 NOTE — Consult Note (Signed)
Physical Medicine and Rehabilitation Consult   Reason for Consult: Functional deficits Referring Physician: Dr. Pietro Cassis  HPI: Devin [name] is a 80 y.o. male with history of A fib, Crohn's, lung cancer s/p RSXN, thyroid cancer, prostate cancer,  T2DM, COPD, ankylosing spondylitis with chronic LBP and gait disorder who sustained a fall on 09/07/2020 with progressive pain.  History taken from chart review and patient due to trach.  He was admitted on 09/10/2020 with ongoing pain, shortness of breath, and agitation. He became unresponsive in ED requiring intubation for acute on chronic hypercarbic respiratory failure. CT chest revealed acute T11 vertebral fracture and TLSO recommended by Dr. Ronnald Ramp. Once cleared by PCCM, he underwent posterior thoracic fusion T11-T12 on 09/15/2020 he was unable to tolerate extubation X 2 despite decadron to help with edema and ultimately required tracheostomy on 09/19/2020.  Hospital course further complicated by atrial fibrillation with RVR, copious secretions,  Kleb PNA, fluid overload, diarrhea due to crohn's. He was started on weaning trials and has been off vent since 04/18. He was started on PMSV trials and therapy ongoing. CIR recommended due to debility.  Review of Systems  Constitutional: Negative for chills and fever.  HENT: Negative for hearing loss and tinnitus.   Eyes: Negative for blurred vision and double vision.  Respiratory: Positive for sputum production. Negative for cough and shortness of breath.   Cardiovascular: Negative for chest pain.  Gastrointestinal: Positive for diarrhea. Negative for heartburn and nausea.  Genitourinary: Negative for dysuria and urgency.  Skin: Negative for itching and rash.  Neurological: Positive for weakness. Negative for dizziness and headaches.  Psychiatric/Behavioral: The patient does not have insomnia.   All other systems reviewed and are negative.  Past Medical History:  Diagnosis Date  . A-fib ([place])   .  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 Ankylosing spondylitis ([place])    Diagnosed during lumbar fracture summer of 2016    . Crohn's disease ([place])   . Gout   . HIstory of basal cell cancer of face    THYROID CA HX  . History of kidney stones   . Hypertension   . Hypothyroidism   . Impotence   . 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 Obesity (BMI 30-39.9)   . Osteoporosis    Pt completed 5 years of fosamax in 2013     . Prostate cancer with recurrence    Treated with prostatectomy with recurrence 2012 with Lupron treatment now him   . Psoriasis   . Thyroid cancer ([place]) 1999   Treated with RAI and total thyroidectomy   . Type II diabetes mellitus ([place])     Past Surgical History:  Procedure Laterality Date  . ABDOMINAL EXPLORATION SURGERY     for small bowel obstruction  . APPENDECTOMY  03/2007  . BACK SURGERY    . BASAL CELL CARCINOMA EXCISION  "several"   "head"  . CARDIAC CATHETERIZATION  03/17/2003  . CHOLECYSTECTOMY N/A 05/07/2016   Procedure: LAPAROSCOPIC CHOLECYSTECTOMY;  Surgeon: Fanny Skates, MD;  Location: [facility];  Service: General;  Laterality: N/A;  . COLON SURGERY  03/2007   Resection of cecum, appendix, terminal ileum (approximately/notes 10/10/2010  . CYSTOSCOPY/URETEROSCOPY/HOLMIUM LASER/STENT PLACEMENT Right 07/03/2020   Procedure: CYSTOSCOPY/RETROGRADE/URETEROSCOPY REMOVAL OF BLADDER STONE;  Surgeon: Raynelle Bring, MD;  Location: [facility];  Service: Urology;  Laterality: Right;  . HERNIA REPAIR    . LAMINECTOMY WITH POSTERIOR LATERAL ARTHRODESIS LEVEL 1 N/A 09/15/2020  Procedure: REVISION OF THORACOLUMBAR FUSION, ADDITION OF CROSS-CONNECTORS;  Surgeon: Eustace Moore, MD;  Location: [facility];  Service: Neurosurgery;  Laterality: N/A;  . LAPAROSCOPIC CHOLECYSTECTOMY  05/07/2016  . LAPAROSCOPIC LYSIS OF ADHESIONS  05/07/2016  . LAPAROSCOPIC LYSIS OF ADHESIONS N/A 05/07/2016   Procedure: LAPAROSCOPIC LYSIS OF ADHESIONS TIMES  ONE HOUR;  Surgeon: Fanny Skates, MD;  Location: [facility];  Service: General;  Laterality: N/A;  . POSTERIOR FUSION THORACIC SPINE  02/08/2016   1. Posterior thoracic arthrodesis T7-T11 utilizing morcellized allograft, 2. Posterior thoracic segmental fixation T7-T11 utilizing nuvasive pedicle screws  . PROSTATECTOMY  06/2001   w/bilateral pelvic lymph nose dissection/notes 10/24/2010  . SPINAL FUSION  12/2014   Open reduction internal fixation of L1 Chance fracture with posterior fusion T10-L4 utilizing morcellized allograft and some local autograft, segmental instrumentation T10-L4 inclusive utilizing nuvasive pedicle screws/notes 12/16/2014  . Stress Cardiolite  02/17/2003  . THOROCOTOMY WITH LOBECTOMY  03/16/2015   Procedure: THOROCOTOMY WITH LOBECTOMY;  Surgeon: Ivin Poot, MD;  Location: [facility];  Service: Thoracic;;  . TONSILLECTOMY    . TOTAL THYROIDECTOMY  1997  . Venous Doppler  05/30/2004  . VENTRAL HERNIA REPAIR  04/14/2008  . VIDEO ASSISTED THORACOSCOPY Left 03/16/2015   Procedure: VIDEO ASSISTED THORACOSCOPY;  Surgeon: Ivin Poot, MD;  [facility];  Service: Thoracic;  Laterality: Left;    Family History  Problem Relation Age of Onset  . CAD Mother   . Cancer Neg Hx        No cancer in the patient's immediate family, except of course for the patient himself, as noted    Social History:  Married. Retired. Independent without AD. Per  reports that he quit smoking about 30 years ago. His smoking use included cigarettes. He has a 60.00 pack-year smoking history. He has never used smokeless tobacco. He reports previous alcohol use. He reports that he does not use drugs.    Allergies: No Known Allergies    Medications Prior to Admission  Medication Sig Dispense Refill  . albuterol (VENTOLIN HFA) 108 (90 Base) MCG/ACT inhaler Inhale 2 puffs into the lungs every 6 (six) hours as needed for wheezing or shortness of breath. 8 g 6  . ALPRAZolam (XANAX) 0.5 MG tablet Take 0.5  mg by mouth 2 (two) times daily as needed for anxiety.    Marland Kitchen amoxicillin-clavulanate (AUGMENTIN) 875-125 MG tablet Take 1 tablet by mouth every 12 (twelve) hours.    Marland Kitchen apixaban (ELIQUIS) 5 MG TABS tablet Take 5 mg by mouth 2 (two) times daily.    Marland Kitchen atenolol (TENORMIN) 25 MG tablet Take 1 tablet (25 mg total) by mouth 2 (two) times daily.    . busPIRone (BUSPAR) 15 MG tablet Take 15 mg by mouth 2 (two) times daily.     . calcium-vitamin D (OSCAL WITH D) 500-200 MG-UNIT tablet Take 2 tablets by mouth daily.     . hydrochlorothiazide (HYDRODIURIL) 12.5 MG tablet Take 12.5 mg by mouth every morning.    Marland Kitchen HYDROcodone-acetaminophen (NORCO/VICODIN) 5-325 MG tablet Take 1 tablet by mouth every 6 (six) hours as needed (pain).    . insulin lispro (HUMALOG KWIKPEN) 100 UNIT/ML KiwkPen INJECT UNDER THE SKIN THREE TIMES DAILY( 25 UNITS, 25 UNITS AND 20 UNITS RESPECTIVELY BEFORE MEALS) (Patient taking differently: Inject 20-30 Units into the skin See admin instructions. Inject 25 units subcutaneously before breakfast, 20 units before lunch and 30 units before supper) 30 mL 5  . levothyroxine (SYNTHROID) 125 MCG tablet  Take 1 tablet (125 mcg total) by mouth daily before breakfast. 30 tablet 0  . Mesalamine 800 MG TBEC Take 1,600 mg by mouth 2 (two) times daily.    . methocarbamol (ROBAXIN) 500 MG tablet Take 500 mg by mouth every 8 (eight) hours.    . Multiple Vitamins-Iron (DAILY-VITE/IRON/BETA-CAROTENE) TABS Take 1 tablet by mouth daily.     . Tiotropium Bromide Monohydrate (SPIRIVA RESPIMAT) 2.5 MCG/ACT AERS Inhale 2 puffs into the lungs daily. 4 g 0  . zinc gluconate 50 MG tablet Take 50 mg by mouth daily.    . B-D ULTRAFINE III SHORT PEN 31G X 8 MM MISC USE 1 PEN NEEDLE FOUR TIMES DAILY. 100 each 0  . diltiazem (CARDIZEM) 30 MG tablet Take 1 tablet every 4 hours AS NEEDED for heart rate >100 as long as blood pressure >100. (Patient not taking: No sig reported) 45 tablet 1  . Glucose Blood (ASCENSIA CONTOUR  TEST VI) Use as directed to test two times a day    . glucose blood (BAYER CONTOUR TEST) test strip TEST BLOOD SUGAR TWICE DAILY AS DIRECTED. 200 each 2  . glucose blood test strip 1 each by Other route 2 times daily.    . Insulin Pen Needle 31G X 8 MM MISC Inject 31 g into the skin.    Marland Kitchen leuprolide (LUPRON) 11.25 MG injection Inject 11.25 mg into the muscle every 6 (six) months.       Home: Home Living Family/patient expects to be discharged to:: Inpatient rehab Living Arrangements: Spouse/significant other Additional Comments: daughter present at time of this eval.  Functional History: Prior Function Level of Independence: Independent Functional Status:  Mobility: Bed Mobility Overal bed mobility: Needs Assistance Bed Mobility: Rolling,Sidelying to Sit Rolling: Min assist Sidelying to sit: Min assist Supine to sit: +2 for physical assistance,Mod assist,HOB elevated Sit to supine: +2 for physical assistance,Mod assist Sit to sidelying: Min assist General bed mobility comments: Min A for elevating trunk Transfers Overall transfer level: Needs assistance Equipment used: Rolling walker (2 wheeled) Transfers: Sit to/from [employer] Sit to Stand: [employer] safety/equipment Stand pivot transfers: Min assist,+2 physical assistance,+2 safety/equipment General transfer comment: Min A for power up into standing, Min A +2 for gaining balance Ambulation/Gait Ambulation/Gait assistance: Min assist,+2 physical assistance,+2 safety/equipment Gait Distance (Feet): 10 Feet Assistive device: Rolling walker (2 wheeled) Gait Pattern/deviations: Step-through pattern,Decreased stride length,Shuffle,Trunk flexed,Narrow base of support General Gait Details: min assist +2 for steadying, managing RW, lines/leads; VC for upright posture, placement in RW Gait velocity: decr Gait velocity interpretation: <1.31 ft/sec, indicative of household ambulator    ADL: ADL Overall ADL's :  Needs assistance/impaired Eating/Feeding: NPO Grooming: Maximal assistance,Sitting Lower Body Bathing: Maximal assistance,Sit to/from stand Lower Body Bathing Details (indicate cue type and reason): Max A for washing feet Upper Body Dressing : Maximal assistance,Sitting Upper Body Dressing Details (indicate cue type and reason): Max A to don new brace while sitting at EOB Lower Body Dressing: Moderate assistance,Sit to/from stand,With adaptive equipment Lower Body Dressing Details (indicate cue type and reason): don socks with AE. Pt doffing socks with reacher. Mod A for managing sock aide. Toilet Transfer: Minimal assistance,+2 for physical assistance,+2 for safety/equipment,Ambulation,RW (simulated to recliner) Toilet Transfer Details (indicate cue type and reason): MIn A for support and RW management. Functional mobility during ADLs: Rolling walker,Cueing for safety,Minimal assistance,+2 for physical assistance,+2 for safety/equipment General ADL Comments: Pt performing functional mobility in room and then practicing LB dressing with AE  Cognition:  Cognition Overall Cognitive Status: Difficult to assess Orientation Level: Oriented X4 Cognition Arousal/Alertness: Awake/alert Behavior During Therapy: WFL for tasks assessed/performed Overall Cognitive Status: Difficult to assess General Comments: Following one step commands. Slightly flat, borderline irritable during certain tasks. Pt does make jokes occasionally during session Difficult to assess due to: Tracheostomy   Blood pressure 136/63, pulse 73, temperature 97.9 F (36.6 C), temperature source Oral, resp. rate (!) 26, height 5' 9.02" (1.753 m), weight 82.8 kg, SpO2 96 %. Physical Exam Vitals and nursing note reviewed.  Constitutional:      General: He is not in acute distress.    Comments: Congested,  productive cough --able to suction independently.   HENT:     Head: Normocephalic and atraumatic.     Comments: + NG     Right Ear: External ear normal.     Left Ear: External ear normal.     Nose: Nose normal.  Eyes:     General:        Right eye: No discharge.        Left eye: No discharge.     Extraocular Movements: Extraocular movements intact.  Neck:     Comments: Cuffed #8 trach in place with thick yellow secretions. .  Cardiovascular:     Rate and Rhythm: Normal rate and regular rhythm.  Pulmonary:     Effort: Pulmonary effort is normal. No respiratory distress.     Breath sounds: No stridor.  Abdominal:     General: Abdomen is flat. Bowel sounds are normal. There is no distension.  Musculoskeletal:     Comments: No edema or tenderness in extremities  Skin:    General: Skin is warm and dry.  Neurological:     Mental Status: He is alert.     Comments: Alert Motor: Bilateral upper extremities: 5/5 proximal distal Right lower extremity: 4+/5 proximal distally Left lower extremity: 4--4/5 proximal to distal (pain inhibition)  Psychiatric:        Mood and Affect: Mood normal.        Behavior: Behavior normal.     Results for orders placed or performed during the hospital encounter of 09/10/20 (from the past 24 hour(s))  Glucose, capillary     Status: Abnormal   Collection Time: 09/26/20 11:36 AM  Result Value Ref Range   Glucose-Capillary 132 (H) 70 - 99 mg/dL  Glucose, capillary     Status: Abnormal   Collection Time: 09/26/20  4:45 PM  Result Value Ref Range   Glucose-Capillary 127 (H) 70 - 99 mg/dL  Glucose, capillary     Status: Abnormal   Collection Time: 09/26/20  7:41 PM  Result Value Ref Range   Glucose-Capillary 144 (H) 70 - 99 mg/dL  Glucose, capillary     Status: Abnormal   Collection Time: 09/26/20 10:49 PM  Result Value Ref Range   Glucose-Capillary 129 (H) 70 - 99 mg/dL  Glucose, capillary     Status: Abnormal   Collection Time: 09/27/20  4:49 AM  Result Value Ref Range   Glucose-Capillary 166 (H) 70 - 99 mg/dL  Glucose, capillary     Status: Abnormal   Collection  Time: 09/27/20  7:54 AM  Result Value Ref Range   Glucose-Capillary 109 (H) 70 - 99 mg/dL   No results found.  Assessment/Plan: Diagnosis: Debility Labs independently reviewed.  Records reviewed and summated above.  1. Does the need for close, 24 hr/day medical supervision in concert with the patient's rehab needs make it unreasonable  for this patient to be served in a less intensive setting? Yes  2. Co-Morbidities requiring supervision/potential complications:  A fib (monitor heart rate with increased activity, anticoagulation), Crohn's, lung cancer s/p RSXN, thyroid cancer, prostate cancer,  T2 DM (Monitor in accordance with exercise and adjust meds as necessary), COPD (Monitor for signs of respiratory distress. Provide oxygen with goals 88 - 92% if needed. Augment therapies as needed based off endurance. 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 Due to bowel management, safety, skin/wound care, disease management, pain management and patient education, does the patient require 24 hr/day rehab nursing? Yes 4. Does the patient require coordinated care of a physician, rehab nurse, therapy disciplines of PT/OT/SLP to address physical and functional deficits in the context of the above medical diagnosis(es)? Yes Addressing deficits in the following areas: balance, endurance, locomotion, strength, transferring, bathing, dressing, toileting, speech, swallowing and psychosocial support 5. Can the patient actively participate in an intensive therapy program of at least 3 hrs of therapy per day at least 5 days per week? Yes 6. The potential for patient to make measurable gains while on inpatient rehab is excellent 7. Anticipated functional outcomes upon discharge from inpatient rehab are supervision and min assist  with PT, supervision and min assist with OT, modified independent with SLP. 8. Estimated  rehab length of stay to reach the above functional goals is: 12-16 days. 9. Anticipated discharge destination: Home 10. Overall Rehab/Functional Prognosis: excellent and good  RECOMMENDATIONS: This patient's condition is appropriate for continued rehabilitative care in the following setting: CIR Patient has agreed to participate in recommended program. Yes Note that insurance prior authorization may be required for reimbursement for recommended care.  Comment: Rehab Admissions Coordinator to follow up.  I have personally performed a face to face diagnostic evaluation, including, but not limited to relevant history and physical exam findings, of this patient and developed relevant assessment and plan.  Additionally, I have reviewed and concur with the physician assistant's documentation above.   Delice Lesch, MD, ABPMR Bary Leriche, PA-C 09/27/2020

## 2020-09-28 DIAGNOSIS — J9621 Acute and chronic respiratory failure with hypoxia: Secondary | ICD-10-CM | POA: Diagnosis not present

## 2020-09-28 DIAGNOSIS — J9622 Acute and chronic respiratory failure with hypercapnia: Secondary | ICD-10-CM | POA: Diagnosis not present

## 2020-09-28 LAB — COMPREHENSIVE METABOLIC PANEL
ALT: 15 U/L (ref 0–44)
AST: 20 U/L (ref 15–41)
Albumin: 2.1 g/dL — ABNORMAL LOW (ref 3.5–5.0)
Alkaline Phosphatase: 113 U/L (ref 38–126)
Anion gap: 6 (ref 5–15)
BUN: 50 mg/dL — ABNORMAL HIGH (ref 8–23)
CO2: 36 mmol/L — ABNORMAL HIGH (ref 22–32)
Calcium: 8.7 mg/dL — ABNORMAL LOW (ref 8.9–10.3)
Chloride: 94 mmol/L — ABNORMAL LOW (ref 98–111)
Creatinine, Ser: 1.1 mg/dL (ref 0.61–1.24)
GFR, Estimated: >60 mL/min (ref >60)
Glucose, Bld: 204 mg/dL — ABNORMAL HIGH (ref 70–99)
Potassium: 5 mmol/L (ref 3.5–5.1)
Sodium: 136 mmol/L (ref 135–145)
Total Bilirubin: 0.4 mg/dL (ref 0.3–1.2)
Total Protein: 5.5 g/dL — ABNORMAL LOW (ref 6.5–8.1)

## 2020-09-28 LAB — CBC WITH DIFFERENTIAL/PLATELET
Abs Immature Granulocytes: 0.09 10*3/uL — ABNORMAL HIGH (ref 0.00–0.07)
Basophils Absolute: 0.1 10*3/uL (ref 0.0–0.1)
Basophils Relative: 0 %
Eosinophils Absolute: 0.1 10*3/uL (ref 0.0–0.5)
Eosinophils Relative: 1 %
HCT: 31.5 % — ABNORMAL LOW (ref 39.0–52.0)
Hemoglobin: 9.8 g/dL — ABNORMAL LOW (ref 13.0–17.0)
Immature Granulocytes: 1 %
Lymphocytes Relative: 5 %
Lymphs Abs: 0.6 10*3/uL — ABNORMAL LOW (ref 0.7–4.0)
MCH: 30.3 pg (ref 26.0–34.0)
MCHC: 31.1 g/dL (ref 30.0–36.0)
MCV: 97.5 fL (ref 80.0–100.0)
Monocytes Absolute: 1 10*3/uL (ref 0.1–1.0)
Monocytes Relative: 8 %
Neutro Abs: 9.9 10*3/uL — ABNORMAL HIGH (ref 1.7–7.7)
Neutrophils Relative %: 85 %
Platelets: 400 10*3/uL (ref 150–400)
RBC: 3.23 MIL/uL — ABNORMAL LOW (ref 4.22–5.81)
RDW: 13.5 % (ref 11.5–15.5)
WBC: 11.7 10*3/uL — ABNORMAL HIGH (ref 4.0–10.5)
nRBC: 0 % (ref 0.0–0.2)

## 2020-09-28 LAB — MAGNESIUM: Magnesium: 2 mg/dL (ref 1.7–2.4)

## 2020-09-28 LAB — GLUCOSE, CAPILLARY
Glucose-Capillary: 112 mg/dL — ABNORMAL HIGH (ref 70–99)
Glucose-Capillary: 135 mg/dL — ABNORMAL HIGH (ref 70–99)
Glucose-Capillary: 150 mg/dL — ABNORMAL HIGH (ref 70–99)
Glucose-Capillary: 178 mg/dL — ABNORMAL HIGH (ref 70–99)
Glucose-Capillary: 190 mg/dL — ABNORMAL HIGH (ref 70–99)
Glucose-Capillary: 192 mg/dL — ABNORMAL HIGH (ref 70–99)
Glucose-Capillary: 208 mg/dL — ABNORMAL HIGH (ref 70–99)
Glucose-Capillary: 219 mg/dL — ABNORMAL HIGH (ref 70–99)

## 2020-09-28 LAB — PHOSPHORUS: Phosphorus: 3.2 mg/dL (ref 2.5–4.6)

## 2020-09-28 MED ORDER — CHOLESTYRAMINE 4 G PO PACK
4.0000 g | PACK | Freq: Two times a day (BID) | ORAL | Status: DC
  Administered 2020-09-30 – 2020-10-01 (×3): 4 g via ORAL
  Filled 2020-09-28 (×8): qty 1

## 2020-09-28 NOTE — Progress Notes (Signed)
PROGRESS NOTE  Devin [name]  DOB: May 04, 1941  PCP: Prince Solian, MD WIO:[id number]  DOA: 09/10/2020  LOS: 18 days   Chief Complaint  Patient presents with  . Back Pain  . Vomiting   Brief narrative: Devin [name] is a 80 y.o. male with PMH significant for DM2, HTN, A. 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  On 3/31, patient fell while shopping at a bookstore.  No head trauma or LOC but had a new onset back pain.  4/1, seen by PCP and underwent thoracic, lumbar, and chest x-ray. Found to have new T11 Chance fracture.He also has a stable L1 compression fracture, extensive lumbar spondylosis and postsurgical changes. Additionally hada left seventh rib fracture anteriorly and a left pleural effusion without any obvious pneumothorax.  Pain control was tried as an outpatient with hydrocodone and Robaxin but it became intolerable.  Additionally, he was not able to use his incentive spirometer because of chest pain from rib fracture.  He continued to have progressive shortness of breath and hence presented to ED on 4/3 with excruciating pain, agitation, shortness of breath.  In the ED, patient required intubation and admitted to ICU. Seen by neurosurgery.  Significant Hospital events  4/8, neurosurgery did laking of the prior rod systems (T7-11 and T11-L4 rods) across T11 fracture.  4/9- failed attempt extubation. Decadron X 4 doses for airway edema   4/10 IVFs increased for mild Cr bump  4/11 Failed extubation 2/2 progressive weakness, worse cough mechanics (essentially just fatigued and progressed to respiratory failure) re-intubated. Sputum sent post re-intubation   4/12 trach at bedside.   4/13 sputum grew Klebsiella. Low gd temp/ongoing leukocytosis so Ceftriaxone started. GI consulted for  diarrhea in context of known chron's   4/14-4/18 -gradually transition to trach collar  4/20 transferred out to hospitalist service  Subjective: Patient was seen and examined this afternoon.   Sitting up in chair.  Not in distress.  On 5 L oxygen by tracheostomy tube. Tube feeding ongoing through Dobhoff tube.  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  Reviewed extubation trials in ICU.  Underwent tracheostomy on 4/12 -Currently on 5 L oxygen by tracheostomy tube -Continue tracheostomy care  Klebsiella pneumonia Small loculated left effusion -Completed treatment with IV ceftriaxone -Continue yupelri and Pulmicort  Acute kidney injury  -Likely due to aggressive diuresis and diarrhea -Creatinine has normalized now. -Diuretics on hold. -On free water 200 mL every 6 hours through G-tube. -Closely watch fluid balance Recent Labs    09/18/20 0532 09/19/20 0420 09/20/20 0453 09/21/20 0801 09/22/20 0317 09/23/20 0153 09/24/20 0328 09/25/20 0445 09/26/20 0337 09/28/20 0019  BUN 37* 31* 26* 20 24* 34* 42* 46* 50* 50*  CREATININE 1.24 1.14 0.99 0.95 1.02 1.12 1.11 1.28* 1.22 1.10   Paroxysmal Afib  -Was in RVR intermittently in the hospital. -Currently on normal sinus rhythm on atenolol and diltiazem.  Essential hypertension -Blood pressure controlled on atenolol and diltiazem.  Diuretics on hold.  Type 2 diabetes mellitus -A1c 6.4 on 4/3. -Currently on Lantus 10 units twice daily.  Blood sugar mostly less than 200.  Continue to monitor with sliding scale insulin. Recent Labs  Lab 09/27/20 2022 09/27/20 2358 09/28/20 0343 09/28/20 0824 09/28/20 1132  GLUCAP 180* 190* 219* 150* 192*  Acute diarrhea History of Crohn's disease -Recurrence of diarrhea likely because of tube feeding.  Currently  improving after addition of Imodium and fiber.   -Has a Flexi-Seal tube in place -Monitor diarrhea frequency.  T11-T12 Chance fracture -Status postsurgical stabilization by neurosurgery -Continue PT/OT  History of ankylosing spondylitis, kyphoscoliosis -Patient's wife, patient was physically functional prior to this hospitalization.  Continue PT/OT  History of adenocarcinoma s/p LUL lobectomy (2016) History of thyroid and prostate cancer Generalized frailty, recent 40 pound weight loss over 6 to 12 months -Raises concern for return of malignancy. -Continue outpatient follow-up with pulmonology.  Hypothyroidism Continue Synthroid   Nutrition -Currently has tube feeding through Dobbhoff.  Speech therapy evaluation was done few days ago.  Noted a plan to revisit him again later this week.  May need a consideration of PEG tube placement if fails speech evaluation.  Impaired mobility -PT eval obtained.  CIR recommended.  Mobility: CIR recommended Code Status:   Code Status: Full Code  Nutritional status: Body mass index is 26.94 kg/m. Nutrition Problem: Inadequate oral intake Etiology: inability to eat Signs/Symptoms: NPO status Diet Order            Diet NPO time specified  Diet effective now                 DVT prophylaxis: SCD's Start: 09/15/20 1734 apixaban (ELIQUIS) tablet 5 mg   Antimicrobials:  None Fluid: Free water through tube feeding.  Not on IV fluid. Consultants: Critical care Family Communication:  Not at bedside  Status is: Inpatient  Remains inpatient appropriate because: New tracheostomy, has feeding through [place].   Dispo: The patient is from: Home              Anticipated d/c is to: CIR if authorized by insurance.              Patient currently is not medically stable to d/c.   Difficult to place patient No    Infusions:  . sodium chloride 250 mL (09/23/20 1109)  . feeding supplement (OSMOLITE 1.5 CAL) 55 mL/hr at 09/28/20 0400     Scheduled Meds: . acetaminophen (TYLENOL) oral liquid 160 mg/5 mL  500 mg Per Tube Q6H  . apixaban  5 mg Per Tube BID  . atenolol  25 mg Per NG tube BID  . bethanechol  5 mg Per NG tube QID  . budesonide (PULMICORT) nebulizer solution  0.5 mg Nebulization BID  . busPIRone  15 mg Per Tube BID  . chlorhexidine gluconate (MEDLINE KIT)  15 mL Mouth Rinse BID  . Chlorhexidine Gluconate Cloth  6 each Topical Q0600  . cholestyramine  4 g Oral BID AC  . diltiazem  30 mg Per Tube Q6H  . feeding supplement (PROSource TF)  45 mL Per Tube TID  . fiber  1 packet Per Tube BID  . free water  200 mL Per Tube Q6H  . Gerhardt's butt cream   Topical BID  . insulin aspart  0-20 Units Subcutaneous Q4H  . insulin glargine  10 Units Subcutaneous BID  . levothyroxine  125 mcg Per Tube QAC breakfast  . loperamide HCl  4 mg Per Tube BID  . mouth rinse  15 mL Mouth Rinse 10 times per day  . nutrition supplement (JUVEN)  1 packet Per Tube BID BM  . revefenacin  175 mcg Nebulization Daily    Antimicrobials: Anti-infectives (From admission, onward)   Start     Dose/Rate Route Frequency Ordered Stop  09/20/20 1000  cefTRIAXone (ROCEPHIN) 2 g in sodium chloride 0.9 % 100 mL IVPB        2 g 200 mL/hr over 30 Minutes Intravenous Every 24 hours 09/20/20 0821 09/25/20 0755   09/15/20 1830  ceFAZolin (ANCEF) IVPB 2g/100 mL premix        2 g 200 mL/hr over 30 Minutes Intravenous Every 8 hours 09/15/20 1733 09/16/20 0232   09/15/20 1616  vancomycin (VANCOCIN) powder  Status:  Discontinued          As needed 09/15/20 1616 09/15/20 1648   09/15/20 0800  ceFAZolin (ANCEF) IVPB 2g/100 mL premix        2 g 200 mL/hr over 30 Minutes Intravenous To Short Stay 09/15/20 0704 09/15/20 1515   09/10/20 1300  ceFEPIme (MAXIPIME) 2 g in sodium chloride 0.9 % 100 mL IVPB  Status:  Discontinued        2 g 200 mL/hr over 30 Minutes Intravenous  Once 09/10/20 1245 09/10/20 1403   09/10/20 1300  metroNIDAZOLE (FLAGYL) IVPB  500 mg  Status:  Discontinued        500 mg 100 mL/hr over 60 Minutes Intravenous  Once 09/10/20 1245 09/10/20 1403   09/10/20 1300  vancomycin (VANCOCIN) IVPB 1000 mg/200 mL premix  Status:  Discontinued        1,000 mg 200 mL/hr over 60 Minutes Intravenous  Once 09/10/20 1245 09/10/20 1415      PRN meds: sodium chloride, acetaminophen **OR** acetaminophen, ALPRAZolam, loperamide HCl, ondansetron (ZOFRAN) IV, oxyCODONE, phenol   Objective: Vitals:   09/28/20 1135 09/28/20 1148  BP: (!) 149/64   Pulse: 77   Resp: (!) 29 (!) 26  Temp: 98.3 F (36.8 C)   SpO2: 98% 97%    Intake/Output Summary (Last 24 hours) at 09/28/2020 1333 Last data filed at 09/28/2020 0547 Gross per 24 hour  Intake 2850 ml  Output 1401 ml  Net 1449 ml   Filed Weights   09/26/20 0425 09/27/20 0500 09/28/20 0226  Weight: 94 kg 82.8 kg 82.8 kg   Weight change: 0 kg Body mass index is 26.94 kg/m.   Physical Exam: General exam: Pleasant, elderly Caucasian male.  Not in distress Skin: No rashes, lesions or ulcers. HEENT: Atraumatic, normocephalic, no obvious bleeding.  Dobbhoff tube in place Lungs: Clear to auscultation bilaterally CVS: Regular rate and rhythm, no murmur GI/Abd soft, nontender, nondistended, bowel sound present CNS: Nods his head and moves lips to answer questions.  Alert, awake, oriented x3 Psychiatry: Mood appropriate Extremities: No pedal edema, no calf tenderness  Data Review: I have personally reviewed the laboratory data and studies available.  Recent Labs  Lab 09/22/20 0317 09/23/20 0153 09/28/20 0019  WBC 15.6* 13.7* 11.7*  NEUTROABS 12.9*  --  9.9*  HGB 9.8* 9.4* 9.8*  HCT 29.8* 29.2* 31.5*  MCV 91.7 94.8 97.5  PLT 325 309 400   Recent Labs  Lab 09/23/20 0153 09/24/20 0328 09/25/20 0445 09/26/20 0337 09/28/20 0019  NA 136 136 133* 136 136  K 4.2 3.8 3.6 4.5 5.0  CL 96* 95* 91* 94* 94*  CO2 33* 32 33* 37* 36*  GLUCOSE 167* 129* 132* 169* 204*  BUN 34* 42*  46* 50* 50*  CREATININE 1.12 1.11 1.28* 1.22 1.10  CALCIUM 8.3* 8.3* 8.5* 8.3* 8.7*  MG  --   --   --   --  2.0  PHOS  --   --   --   --  3.2  F/u labs ordered Unresulted Labs (From admission, onward)         None      Signed, Terrilee Croak, MD [facility] 09/28/2020

## 2020-09-28 NOTE — Progress Notes (Signed)
Physical Therapy Treatment Patient Details Name: Devin [name] MRN: [id number] DOB: 03/05/1941 Today's Date: 09/28/2020    History of Present Illness 80 yo male who presented 4/3 s/p fell at a bookstore 3/31 and obtained T11 chance fracture, stable L1 compression fracture, extensive lumbar spondylosis and postsurgical changes. Additionally had left 7th rib fx anteriorly and left pleural effusion. Pt intubated in ER on 4/3 for agitation and hypoxemia. 4/8 s/p rod connection for T11 fx. 4/9 failed attempt extubation.  4/12 trach placement intubated 4/3.  PMH restrictive lung disease, emphysema/COPD adenocarcinoma s/p LUL lobectomy Crohn's, Psoriasis, Afib, nephrolithiasis, HTN, DM, obesity, thyroid cancer s/p RAI and total thyroidectomy (1999), and prostate cancer s/p prostatectomy (2012) but with recurrence    PT Comments    Pt continues to be motivated to participate and improve, despite his pain. Pt with urinary incontinence upon coming to stand each time this date, needing cleaning a couple times until a depend was placed. Pt progressing to only requiring minAx1 for bed mobility and transfers. Pt also only needing minAx1 to steady and manage RW with gait, but needs a chair follow due to fatiguing quickly. Pt benefits from having a set visual target to reach to progress gait as he ambulated an increased distance of up to ~15 ft this date. Will continue to follow acutely. Current recommendations remain appropriate.    Follow Up Recommendations  CIR     Equipment Recommendations  3in1 (PT);Rolling walker with 5" wheels    Recommendations for Other Services       Precautions / Restrictions Precautions Precautions: Fall;Other (comment);Back Precaution Booklet Issued: No Precaution Comments: rectal tube, trach collar, NG tube Required Braces or Orthoses: Spinal Brace Spinal Brace: Thoracolumbosacral orthotic;Applied in sitting position Restrictions Weight Bearing Restrictions: No     Mobility  Bed Mobility Overal bed mobility: Needs Assistance Bed Mobility: Rolling;Sidelying to Sit Rolling: Min assist Sidelying to sit: Min assist       General bed mobility comments: Min A for elevating trunk, cues to log roll.    Transfers Overall transfer level: Needs assistance Equipment used: Rolling walker (2 wheeled) Transfers: Sit to/from Stand Sit to Stand: Min assist;+2 safety/equipment         General transfer comment: Min A for power up into standing, cuing for hand placement.  Ambulation/Gait Ambulation/Gait assistance: Min assist;+2 safety/equipment Gait Distance (Feet): 15 Feet (x2 bouts of ~8 ft > ~15 ft) Assistive device: Rolling walker (2 wheeled) Gait Pattern/deviations: Step-through pattern;Decreased stride length;Shuffle;Trunk flexed;Narrow base of support Gait velocity: decr Gait velocity interpretation: <1.31 ft/sec, indicative of household ambulator General Gait Details: MinA for steadying, managing RW, lines/leads with chair follow; VC for upright posture and repeated VC for placement in RW as he tends to get distal to it; pt fatigues quickly, benefits from set target surface   Stairs             Wheelchair Mobility    Modified Rankin (Stroke Patients Only)       Balance Overall balance assessment: Needs assistance Sitting-balance support: Bilateral upper extremity supported;Feet supported;Single extremity supported Sitting balance-Leahy Scale: Fair Sitting balance - Comments: Pt with preference for UE support on bed.   Standing balance support: Bilateral upper extremity supported;During functional activity Standing balance-Leahy Scale: Poor Standing balance comment: Reliant on bil UE support and external assist.                            Cognition Arousal/Alertness: Awake/alert Behavior  During Therapy: WFL for tasks assessed/performed Overall Cognitive Status: Difficult to assess                                  General Comments: Following one step commands appropriately. Pt needing reminders for hand placement and sequencing of tasks. Pt with urinary incontinence, but aware of it, when coming to stand each time.      Exercises      General Comments        Pertinent Vitals/Pain Pain Assessment: 0-10 Pain Score: 5  Pain Location: back, generalized Pain Descriptors / Indicators: Discomfort;Grimacing;Guarding Pain Intervention(s): Limited activity within patient's tolerance;Monitored during session;Repositioned;RN gave pain meds during session    Home Living                      Prior Function            PT Goals (current goals can now be found in the care plan section) Acute Rehab PT Goals Patient Stated Goal: to walk PT Goal Formulation: With patient/family Time For Goal Achievement: 10/04/20 Potential to Achieve Goals: Good Progress towards PT goals: Progressing toward goals    Frequency    Min 5X/week      PT Plan Current plan remains appropriate    Co-evaluation              AM-PAC PT "6 Clicks" Mobility   Outcome Measure  Help needed turning from your back to your side while in a flat bed without using bedrails?: A Little Help needed moving from lying on your back to sitting on the side of a flat bed without using bedrails?: A Little Help needed moving to and from a bed to a chair (including a wheelchair)?: A Little Help needed standing up from a chair using your arms (e.g., wheelchair or bedside chair)?: A Little Help needed to walk in hospital room?: A Little Help needed climbing 3-5 steps with a railing? : A Lot 6 Click Score: 17    End of Session Equipment Utilized During Treatment: Oxygen;Back brace Activity Tolerance: Patient tolerated treatment well Patient left: in chair;with chair alarm set;with call bell/phone within reach;with family/visitor present Nurse Communication: Mobility status;Patient requests pain meds PT Visit  Diagnosis: Pain;Difficulty in walking, not elsewhere classified (R26.2);Unsteadiness on feet (R26.81);Other abnormalities of gait and mobility (R26.89);Muscle weakness (generalized) (M62.81) Pain - part of body:  (back)     Time: 4259-5638 PT Time Calculation (min) (ACUTE ONLY): 43 min  Charges:  $Gait Training: 23-37 mins $Therapeutic Activity: 8-22 mins                     Moishe Spice, PT, DPT Acute Rehabilitation Services  Pager: [phone] Office: [place] 09/28/2020, 1:29 PM

## 2020-09-28 NOTE — Progress Notes (Signed)
Inpatient Rehabilitation Admissions Coordinator  [facility] medicare MD has requested peer to peer with treating MD by 4 pm today. Dr. Pietro Cassis made aware via secure chat to call [phone] option 5 to schedule peer to peer.  Danne Baxter, RN, MSN Rehab Admissions Coordinator [phone] 09/28/2020 12:06 PM

## 2020-09-28 NOTE — Progress Notes (Signed)
Nutrition Follow-up  DOCUMENTATION CODES:   Not applicable  INTERVENTION:  Continue Osmolite 1.5 cal formula via Cortrak NGT at goal rate of 55 ml/hr.   Continue 45 ml Prosource TF TID per tube.   Free water flushes of 200 ml q 6 hours per tube.   Tube feeding regimen provides 2100 kcal, 116 grams of protein, 1803 ml of free water.   Continue Juven BID per tube, each packet provides 95 calories, 2.5 grams of protein for wound healing.   NUTRITION DIAGNOSIS:   Inadequate oral intake related to inability to eat as evidenced by NPO status; ongoing  GOAL:   Patient will meet greater than or equal to 90% of their needs; met with TF  MONITOR:   TF tolerance,Skin,Weight trends,Labs,I & O's  REASON FOR ASSESSMENT:   Ventilator,Consult Enteral/tube feeding initiation and management  ASSESSMENT:   80 y/o male with medical history of afib, adenocarcinoma of the L lung, ankylosing spondylitis, Crohn's disease, nephrolithiasis, HTN, DM, obesity, osteoporosis, prostate cancer, psoriasis, thyroid cancer, and recently dx COPD. He presented to the ED due to back pain. He experienced a fall on 3/31; seen by his PCP for thoracic spine xray, lumbar spine xray, and CXR of the L ribs. He has had increased back pain since the fall and having nausea, dry heaving, and no vomiting episodes. He is not on chemo at this time.  4/03 Intubated 4/09 Failed Extubation 4/11 Extubated, Re-Intubated, Cortrak placed; tip gastric    4/12 s/p trach 4/14-4/18 transitioned to trach collar off vent  Pt continues on trach collar and NPO status. Plans for MBS tomorrow. Pt continues to tolerate his tube feeds well via Cortrak NGT. Plans to continue with current tube feeding regimen. Per MD, if pt fails swallow study, will need consideration of PEG tube.   Labs and medications reviewed.   Diet Order:   Diet Order            Diet NPO time specified  Diet effective now                 EDUCATION NEEDS:    No education needs have been identified at this time  Skin:  Skin Assessment: Reviewed RN Assessment Skin Integrity Issues:: Stage II Stage II: sacrum, mid lower back  Last BM:  4/21 rectal tube 200 ml output  Height:   Ht Readings from Last 1 Encounters:  09/10/20 5' 9.02" (1.753 m)    Weight:   Wt Readings from Last 1 Encounters:  09/28/20 82.8 kg    Ideal Body Weight:  72.73 kg  BMI:  Body mass index is 26.94 kg/m.  Estimated Nutritional Needs:   Kcal:  2000-2200  Protein:  100-125 grams  Fluid:  >/= 2 L/day  Corrin Parker, MS, RD, LDN RD pager number/after hours weekend pager number on Amion.

## 2020-09-28 NOTE — Progress Notes (Signed)
Inpatient Rehabilitation Admissions Coordinator  I have received a denial for CIR admit from [facility] Medicare. I contacted pt's daughter Maudie Mercury, by phone and she is aware. I have arranged to meet pt and his wife Friday at 11 am to begin  appeal process.  Danne Baxter, RN, MSN Rehab Admissions Coordinator [phone] 09/28/2020 7:23 PM

## 2020-09-28 NOTE — Evaluation (Signed)
Clinical/Bedside Swallow Evaluation Patient Details  Name: Devin [name] MRN: [id number] Date of Birth: 16-Mar-1941  Today's Date: 09/28/2020 Time: SLP Start Time (ACUTE ONLY): 7322 SLP Stop Time (ACUTE ONLY): 1534 SLP Time Calculation (min) (ACUTE ONLY): 17 min  Past Medical History:  Past Medical History:  Diagnosis Date  . A-fib ([place])   . Adenocarcinoma of left lung, stage 1 ([place]) 03/10/2015  . Ankylosing spondylitis ([place])    Diagnosed during lumbar fracture summer of 2016    . Crohn's disease ([place])   . Gout   . HIstory of basal cell cancer of face    THYROID CA HX  . History of kidney stones   . Hypertension   . Hypothyroidism   . Impotence   . 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 Obesity (BMI 30-39.9)   . Osteoporosis    Pt completed 5 years of fosamax in 2013     . Prostate cancer with recurrence    Treated with prostatectomy with recurrence 2012 with Lupron treatment now him   . Psoriasis   . Thyroid cancer ([place]) 1999   Treated with RAI and total thyroidectomy   . Type II diabetes mellitus ([place])    Past Surgical History:  Past Surgical History:  Procedure Laterality Date  . ABDOMINAL EXPLORATION SURGERY     for small bowel obstruction  . APPENDECTOMY  03/2007  . BACK SURGERY    . BASAL CELL CARCINOMA EXCISION  "several"   "head"  . CARDIAC CATHETERIZATION  03/17/2003  . CHOLECYSTECTOMY N/A 05/07/2016   Procedure: LAPAROSCOPIC CHOLECYSTECTOMY;  Surgeon: Fanny Skates, MD;  Location: [facility];  Service: General;  Laterality: N/A;  . COLON SURGERY  03/2007   Resection of cecum, appendix, terminal ileum (approximately/notes 10/10/2010  . CYSTOSCOPY/URETEROSCOPY/HOLMIUM LASER/STENT PLACEMENT Right 07/03/2020   Procedure: CYSTOSCOPY/RETROGRADE/URETEROSCOPY REMOVAL OF BLADDER STONE;  Surgeon: Raynelle Bring, MD;  Location: [facility];  Service: Urology;  Laterality: Right;  . HERNIA REPAIR    . LAMINECTOMY WITH POSTERIOR LATERAL ARTHRODESIS LEVEL  1 N/A 09/15/2020   Procedure: REVISION OF THORACOLUMBAR FUSION, ADDITION OF CROSS-CONNECTORS;  Surgeon: Eustace Moore, MD;  Location: [place] [facility];  Service: Neurosurgery;  Laterality: N/A;  . LAPAROSCOPIC CHOLECYSTECTOMY  05/07/2016  . LAPAROSCOPIC LYSIS OF ADHESIONS  05/07/2016  . LAPAROSCOPIC LYSIS OF ADHESIONS N/A 05/07/2016   Procedure: LAPAROSCOPIC LYSIS OF ADHESIONS TIMES ONE HOUR;  Surgeon: Fanny Skates, MD;  Location: [facility];  Service: General;  Laterality: N/A;  . POSTERIOR FUSION THORACIC SPINE  02/08/2016   1. Posterior thoracic arthrodesis T7-T11 utilizing morcellized allograft, 2. Posterior thoracic segmental fixation T7-T11 utilizing nuvasive pedicle screws  . PROSTATECTOMY  06/2001   w/bilateral pelvic lymph nose dissection/notes 10/24/2010  . SPINAL FUSION  12/2014   Open reduction internal fixation of L1 Chance fracture with posterior fusion T10-L4 utilizing morcellized allograft and some local autograft, segmental instrumentation T10-L4 inclusive utilizing nuvasive pedicle screws/notes 12/16/2014  . Stress Cardiolite  02/17/2003  . THOROCOTOMY WITH LOBECTOMY  03/16/2015   Procedure: THOROCOTOMY WITH LOBECTOMY;  Surgeon: Ivin Poot, MD;  Location: [facility];  Service: Thoracic;;  . TONSILLECTOMY    . TOTAL THYROIDECTOMY  1997  . Venous Doppler  05/30/2004  . VENTRAL HERNIA REPAIR  04/14/2008  . VIDEO ASSISTED THORACOSCOPY Left 03/16/2015   Procedure: VIDEO ASSISTED THORACOSCOPY;  Surgeon: Ivin Poot, MD;  Location: [facility];  Service: Thoracic;  Laterality: Left;   HPI:  81 yo male admitted s/p fall 3/31 and obtained  T11 chance fracture, stable L1 compression fracture, extensive lumbar spondylosis and postsurgical changes, left 7th rib fx anteriorly, left pleural effusion. Intubated 4/3 and 4/8 s/p rod connection for T11 fx. 4/9 failed attempt extubation. Trach on 4/12 trach placement.  PMH restrictive lung disease, emphysema/COPD adenocarcinoma s/p LUL lobectomy Crohn's, Psoriasis,  Afib, nephrolithiasis, HTN, DM, obesity, thyroid cancer s/p RAI and total thyroidectomy (1999), and prostate cancer s/p prostatectomy (2012) but with recurrence   Assessment / Plan / Recommendation Clinical Impression  Pt participated in clinical swallow assessment.  PMV was placed and oral care provided.  He tolerated valve with no change in VS and stable SP02.  Voice remains dysphonic with low volume; pt talkative and eager to try ice chips.  He demonstrated active mastication followed by effortful swallow.  He coughed frequently, however it was productive and he was able to bring up dried mucus/secretions orally and suction independently. He is ready for an instrumental swallow study. Recommend proceeding with MBS next date.  Continue NPO pending results. SLP Visit Diagnosis: Dysphagia, unspecified (R13.10)    Aspiration Risk    TBA   Diet Recommendation    NPO      Other  Recommendations Oral Care Recommendations: Oral care QID   Follow up Recommendations Inpatient Rehab        Swallow Study   General HPI: 80 yo male admitted s/p fall 3/31 and obtained T11 chance fracture, stable L1 compression fracture, extensive lumbar spondylosis and postsurgical changes, left 7th rib fx anteriorly, left pleural effusion. Intubated 4/3 and 4/8 s/p rod connection for T11 fx. 4/9 failed attempt extubation. Trach on 4/12 trach placement.  PMH restrictive lung disease, emphysema/COPD adenocarcinoma s/p LUL lobectomy Crohn's, Psoriasis, Afib, nephrolithiasis, HTN, DM, obesity, thyroid cancer s/p RAI and total thyroidectomy (1999), and prostate cancer s/p prostatectomy (2012) but with recurrence Type of Study: Bedside Swallow Evaluation Diet Prior to this Study: NPO;NG Tube Temperature Spikes Noted: No Respiratory Status: Trach;Trach Collar Trach Size and Type: #6;Cuff;Deflated;With PMSV in place History of Recent Intubation: Yes Length of Intubations (days): 9 days Date extubated:  09/19/20 Behavior/Cognition: Alert;Cooperative;Pleasant mood Oral Cavity Assessment: Within Functional Limits Oral Care Completed by SLP: Yes Oral Cavity - Dentition: Missing dentition Self-Feeding Abilities: Needs assist Patient Positioning: Upright in bed Baseline Vocal Quality: Low vocal intensity Volitional Cough: Strong Volitional Swallow: Able to elicit    Oral/Motor/Sensory Function Overall Oral Motor/Sensory Function: Within functional limits   Ice Chips Ice chips: Impaired Presentation: Spoon Pharyngeal Phase Impairments: Cough - Delayed   Thin Liquid Thin Liquid: Not tested    Nectar Thick Nectar Thick Liquid: Not tested   Honey Thick Honey Thick Liquid: Not tested   Puree Puree: Not tested   Solid     Solid: Not tested     Madyx Delfin L. Tivis Ringer, [place] Office number [phone] Pager [phone]  [name] 09/28/2020,3:40 PM

## 2020-09-29 ENCOUNTER — Inpatient Hospital Stay (HOSPITAL_COMMUNITY): Payer: Medicare Other

## 2020-09-29 DIAGNOSIS — J9622 Acute and chronic respiratory failure with hypercapnia: Secondary | ICD-10-CM | POA: Diagnosis not present

## 2020-09-29 DIAGNOSIS — J9621 Acute and chronic respiratory failure with hypoxia: Secondary | ICD-10-CM | POA: Diagnosis not present

## 2020-09-29 LAB — GLUCOSE, CAPILLARY
Glucose-Capillary: 161 mg/dL — ABNORMAL HIGH (ref 70–99)
Glucose-Capillary: 169 mg/dL — ABNORMAL HIGH (ref 70–99)
Glucose-Capillary: 127 mg/dL — ABNORMAL HIGH (ref 70–99)
Glucose-Capillary: 162 mg/dL — ABNORMAL HIGH (ref 70–99)
Glucose-Capillary: 229 mg/dL — ABNORMAL HIGH (ref 70–99)

## 2020-09-29 NOTE — Progress Notes (Signed)
OT Cancellation Note  Patient Details Name: Devin [name] MRN: [id number] DOB: 1940-11-30   Cancelled Treatment:    Reason Eval/Treat Not Completed: Other (comment). Pt for MBS at 1:00, pt prefers to wait until after this swallow study for OT session, will re-attempt later today as schedule allows.  Golden Circle, OTR/L Acute Rehab Services Pager [phone] Office [phone]     [place] Register 09/29/2020, 12:07 PM

## 2020-09-29 NOTE — Progress Notes (Signed)
OT Cancellation Note  Patient Details Name: Devin [name] MRN: [id number] DOB: 12-15-1940   Cancelled Treatment:    Reason Eval/Treat Not Completed: Pain limiting ability to participate. Despite pt having pain meds ~35 minutes ago he reports he still is in too much pain at his lower back/top of his hips to get up with therapy right now. We re--attempt at a later date.  Golden Circle, OTR/L Acute Rehab Services Pager [phone] Office [phone]     [place] Register 09/29/2020, 3:11 PM

## 2020-09-29 NOTE — Progress Notes (Signed)
Returned call to Abram Sander. She was told by pt's spouse " he will never be able to leave the hospital from what was told to her by MD." RN updated grand-daughter, Caryl Pina, on CIR notes and made her aware pt does not go to CIR without having a follow up plan to be discharge home or elsewhere.

## 2020-09-29 NOTE — Progress Notes (Signed)
Modified Barium Swallow Progress Note  Patient Details  Name: Devin [name] MRN: [id number] Date of Birth: September 17, 1940  Today's Date: 09/29/2020  Modified Barium Swallow completed.  Full report located under Chart Review in the Imaging Section.  Brief recommendations include the following:  Clinical Impression  Pt presents with a pharyngeal dysphagia that appears to be related primarily to thickening of the prevertebral tissue at the level of C4-5.  This protuberance inhibits full epiglottic closure over the larynx and directs the bolus material into the laryngeal vestibule.  With PMV in place, the pt had a strong cough response and ejected the barium effectively; however, it tended to fall immediately back into the larynx and portions were subsequently aspirated. Oral phase was [facility].  Discussed imaging with Dr. Pietro Cassis.  Recommend continue NPO with cortrak feedings.  SLP will follow for dysphagia management.   Swallow Evaluation Recommendations   Recommended Consults: Other (Comment) (cervical CT)   SLP Diet Recommendations: NPO;Alternative means - temporary                       Oral Care Recommendations: Oral care QID      Harumi Yamin L. Tivis Ringer, [place] Office number [phone] Pager [phone]   [name] 09/29/2020,2:57 PM

## 2020-09-29 NOTE — Progress Notes (Signed)
Physical Therapy Treatment Patient Details Name: Devin [name] MRN: [id number] DOB: 1941/02/05 Today's Date: 09/29/2020    History of Present Illness 80 yo male who presented 4/3 s/p fell at a bookstore 3/31 and obtained T11 chance fracture, stable L1 compression fracture, extensive lumbar spondylosis and postsurgical changes. Additionally had left 7th rib fx anteriorly and left pleural effusion. Pt intubated in ER on 4/3 for agitation and hypoxemia. 4/8 s/p rod connection for T11 fx. 4/9 failed attempt extubation.  4/12 trach placement intubated 4/3.  PMH restrictive lung disease, emphysema/COPD adenocarcinoma s/p LUL lobectomy Crohn's, Psoriasis, Afib, nephrolithiasis, HTN, DM, obesity, thyroid cancer s/p RAI and total thyroidectomy (1999), and prostate cancer s/p prostatectomy (2012) but with recurrence    PT Comments    Pt continues to be motivated to participate and improve. Pt with no awareness he was lying in a puddle of diarrhea upon start of session, notified RN to assess rectal tube. Pt stood statically for a couple minutes for pericare at EOB with UE support on RW and minA for steadying. Pt continues to require minA and cues to maintain spinal precautions and sequence steps to perform bed mobility. Set goal to ambulate to window to increase gait distance, with success. Pt continues to fatigue quickly and needs a chair follow for gait safety. Will continue to follow acutely. Current recommendations remain appropriate.    Follow Up Recommendations  CIR     Equipment Recommendations  3in1 (PT);Rolling walker with 5" wheels    Recommendations for Other Services       Precautions / Restrictions Precautions Precautions: Fall;Other (comment);Back Precaution Booklet Issued: No Precaution Comments: rectal tube, trach collar, NG tube Required Braces or Orthoses: Spinal Brace Spinal Brace: Thoracolumbosacral orthotic;Applied in sitting position Restrictions Weight Bearing  Restrictions: No    Mobility  Bed Mobility Overal bed mobility: Needs Assistance Bed Mobility: Rolling;Sidelying to Sit Rolling: Min assist Sidelying to sit: Min assist       General bed mobility comments: Min A for elevating trunk, cues to log roll.    Transfers Overall transfer level: Needs assistance Equipment used: Rolling walker (2 wheeled) Transfers: Sit to/from Stand Sit to Stand: Min assist         General transfer comment: Min A for power up into standing, cuing for hand placement.  Ambulation/Gait Ambulation/Gait assistance: Min assist;+2 safety/equipment Gait Distance (Feet): 18 Feet Assistive device: Rolling walker (2 wheeled) Gait Pattern/deviations: Step-through pattern;Decreased stride length;Shuffle;Trunk flexed;Narrow base of support Gait velocity: decr Gait velocity interpretation: <1.31 ft/sec, indicative of household ambulator General Gait Details: MinA for steadying, managing RW, lines/leads with chair follow by daughter; VC for upright posture and repeated VC for placement in RW as he tends to get distal to it; pt fatigues quickly, benefits from set target surface   Stairs             Wheelchair Mobility    Modified Rankin (Stroke Patients Only)       Balance Overall balance assessment: Needs assistance Sitting-balance support: Bilateral upper extremity supported;Feet supported;Single extremity supported Sitting balance-Leahy Scale: Fair Sitting balance - Comments: Pt with preference for UE support on bed.   Standing balance support: Bilateral upper extremity supported;During functional activity Standing balance-Leahy Scale: Poor Standing balance comment: Reliant on bil UE support and external assist.                            Cognition Arousal/Alertness: Awake/alert Behavior During Therapy: WFL for tasks  assessed/performed Overall Cognitive Status: Difficult to assess                                  General Comments: Following one step commands appropriately. Pt needing reminders for hand placement and sequencing of tasks. Pt sitting in puddle of diarrhea upon entry, with no awareness.      Exercises      General Comments General comments (skin integrity, edema, etc.): Pt in puddle of diarrhea in bed upon entry with no awareness, RN notified who brought RT to assist PT with clean up and to assess rectal tube placement which appeared fine but it could have been kinked and caused backflow      Pertinent Vitals/Pain Pain Assessment: Faces Faces Pain Scale: Hurts even more Pain Location: back, generalized Pain Descriptors / Indicators: Discomfort;Grimacing;Guarding Pain Intervention(s): Limited activity within patient's tolerance;Monitored during session;Repositioned;Premedicated before session    Home Living                      Prior Function            PT Goals (current goals can now be found in the care plan section) Acute Rehab PT Goals Patient Stated Goal: to walk PT Goal Formulation: With patient/family Time For Goal Achievement: 10/04/20 Potential to Achieve Goals: Good Progress towards PT goals: Progressing toward goals    Frequency    Min 5X/week      PT Plan Current plan remains appropriate    Co-evaluation              AM-PAC PT "6 Clicks" Mobility   Outcome Measure  Help needed turning from your back to your side while in a flat bed without using bedrails?: A Little Help needed moving from lying on your back to sitting on the side of a flat bed without using bedrails?: A Little Help needed moving to and from a bed to a chair (including a wheelchair)?: A Little Help needed standing up from a chair using your arms (e.g., wheelchair or bedside chair)?: A Little Help needed to walk in hospital room?: A Little Help needed climbing 3-5 steps with a railing? : A Lot 6 Click Score: 17    End of Session Equipment Utilized During Treatment:  Oxygen;Back brace Activity Tolerance: Patient tolerated treatment well Patient left: in chair;with chair alarm set;with call bell/phone within reach;with family/visitor present Nurse Communication: Mobility status;Other (comment) (diarrhea puddle upon entry) PT Visit Diagnosis: Pain;Difficulty in walking, not elsewhere classified (R26.2);Unsteadiness on feet (R26.81);Other abnormalities of gait and mobility (R26.89);Muscle weakness (generalized) (M62.81) Pain - part of body:  (back)     Time: 0712-1975 PT Time Calculation (min) (ACUTE ONLY): 42 min  Charges:  $Gait Training: 8-22 mins $Therapeutic Activity: 23-37 mins                     Moishe Spice, PT, DPT Acute Rehabilitation Services  Pager: [phone] Office: [place] 09/29/2020, 6:30 PM

## 2020-09-29 NOTE — Progress Notes (Signed)
Inpatient Rehabilitation Admissions Coordinator    I met with patient and his wife at bedside. I also spoke with his daughter, [name] by phone. I will begin insurance appeal with [facility] medicare today for a possible CIR admit next week.  Danne Baxter, RN, MSN Rehab Admissions Coordinator [phone] 09/29/2020 12:25 PM

## 2020-09-29 NOTE — Progress Notes (Signed)
PROGRESS NOTE  STAN CANTAVE  DOB: [birth year]  PCP: Prince Solian, MD IFO:[id number]  DOA: 09/10/2020  LOS: 19 days   Chief Complaint  Patient presents with  . Back Pain  . Vomiting   Brief narrative: Devin [name] is a 80 y.o. male with PMH significant for DM2, HTN, A. 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  On 3/31, patient fell while shopping at a bookstore.  No head trauma or LOC but had a new onset back pain.  4/1, seen by PCP and underwent thoracic, lumbar, and chest x-ray. Found to have new T11 Chance fracture.He also has a stable L1 compression fracture, extensive lumbar spondylosis and postsurgical changes. Additionally hada left seventh rib fracture anteriorly and a left pleural effusion without any obvious pneumothorax.  Pain control was tried as an outpatient with hydrocodone and Robaxin but it became intolerable.  Additionally, he was not able to use his incentive spirometer because of chest pain from rib fracture.  He continued to have progressive shortness of breath and hence presented to ED on 4/3 with excruciating pain, agitation, shortness of breath.  In the ED, patient required intubation and admitted to ICU. Seen by neurosurgery.  Significant Hospital events  4/8, neurosurgery did laking of the prior rod systems (T7-11 and T11-L4 rods) across T11 fracture.  4/9- failed attempt extubation. Decadron X 4 doses for airway edema   4/10 IVFs increased for mild Cr bump  4/11 Failed extubation 2/2 progressive weakness, worse cough mechanics (essentially just fatigued and progressed to respiratory failure) re-intubated. Sputum sent post re-intubation   4/12 trach at bedside.   4/13 sputum grew Klebsiella. Low gd temp/ongoing leukocytosis so Ceftriaxone started. GI consulted for  diarrhea in context of known chron's   4/14-4/18 -gradually transition to trach collar  4/20 transferred out to hospitalist service  Subjective: Patient was seen and examined this morning.  Elderly Caucasian male.  Lying down in bed.  Suctioning himself.  Ongoing tube feeding.  Wife at bedside. Pending barium swallow this afternoon.  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  Reviewed extubation trials in ICU.  Underwent tracheostomy on 4/12 -Currently on 5 L oxygen by tracheostomy tube -Continue tracheostomy care.  Continue frequent suctioning.  Klebsiella pneumonia Small loculated left effusion -Completed treatment with IV ceftriaxone -Continue yupelri and Pulmicort  Dysphagia -Currently has tube feeding through Dobbhoff.  Speech therapy following.  Noted a plan to do barium swallow study today.    Acute kidney injury  -Likely due to aggressive diuresis and diarrhea -Creatinine has normalized now. -Diuretics on hold. -On free water 200 mL every 6 hours through G-tube. -Closely watch fluid balance Recent Labs    09/18/20 0532 09/19/20 0420 09/20/20 0453 09/21/20 0801 09/22/20 0317 09/23/20 0153 09/24/20 0328 09/25/20 0445 09/26/20 0337 09/28/20 0019  BUN 37* 31* 26* 20 24* 34* 42* 46* 50* 50*  CREATININE 1.24 1.14 0.99 0.95 1.02 1.12 1.11 1.28* 1.22 1.10   Paroxysmal Afib  -Was in RVR intermittently in the hospital. -Currently on normal sinus rhythm on atenolol and diltiazem.  Essential hypertension -Blood pressure controlled on atenolol and diltiazem.  Diuretics on hold.  Type 2 diabetes mellitus -A1c 6.4 on 4/3. -Currently on Lantus 10 units twice daily.  Blood sugar mostly less than 200.  Continue  to monitor with sliding scale insulin. Recent Labs  Lab 09/28/20 1951 09/28/20 2349  09/29/20 0403 09/29/20 0811 09/29/20 1134  GLUCAP 208* 135* 161* 169* 127*   Acute diarrhea History of Crohn's disease -Recurrence of diarrhea likely because of tube feeding. Currently on Imodium and fiber but continues to have liquidy stool on Flexi-Seal tube. -Per patient's wife prior to admission, patient was on mesalamine 1600 mg twice daily and as needed loperamide.  Patient is currently not able to get mesalamine because it cannot be crushed.  I hope patient passes swallow eval today after which he can be started on oral feeding and oral pills.  T11-T12 Chance fracture -Status postsurgical stabilization by neurosurgery -Continue PT/OT  History of ankylosing spondylitis, kyphoscoliosis -Patient's wife, patient was physically functional prior to this hospitalization.  Continue PT/OT  History of adenocarcinoma s/p LUL lobectomy (2016) History of thyroid and prostate cancer Generalized frailty, recent 40 pound weight loss over 6 to 12 months -Raises concern for return of malignancy. -Continue outpatient follow-up with pulmonology.  Hypothyroidism Continue Synthroid   Impaired mobility -PT eval obtained.  CIR recommended.  But it was denied by his insurance.  Pending a file for reauthorization/appeal.  Mobility: CIR recommended Code Status:   Code Status: Full Code  Nutritional status: Body mass index is 26.78 kg/m. Nutrition Problem: Inadequate oral intake Etiology: inability to eat Signs/Symptoms: NPO status Diet Order            Diet NPO time specified  Diet effective now                 DVT prophylaxis: SCD's Start: 09/15/20 1734 apixaban (ELIQUIS) tablet 5 mg   Antimicrobials:  None Fluid: Free water through tube feeding.  Not on IV fluid. Consultants: Critical care Family Communication:  Not at bedside  Status is: Inpatient  Remains inpatient appropriate because: New tracheostomy, has feeding through [place].   Dispo: The patient is from: Home               Anticipated d/c is to: CIR if authorized by insurance.              Patient currently is not medically stable to d/c.   Difficult to place patient No    Infusions:  . sodium chloride 250 mL (09/23/20 1109)  . feeding supplement (OSMOLITE 1.5 CAL) 1,000 mL (09/29/20 1151)    Scheduled Meds: . acetaminophen (TYLENOL) oral liquid 160 mg/5 mL  500 mg Per Tube Q6H  . apixaban  5 mg Per Tube BID  . atenolol  25 mg Per NG tube BID  . bethanechol  5 mg Per NG tube QID  . budesonide (PULMICORT) nebulizer solution  0.5 mg Nebulization BID  . busPIRone  15 mg Per Tube BID  . chlorhexidine gluconate (MEDLINE KIT)  15 mL Mouth Rinse BID  . Chlorhexidine Gluconate Cloth  6 each Topical Q0600  . cholestyramine  4 g Oral BID AC  . diltiazem  30 mg Per Tube Q6H  . feeding supplement (PROSource TF)  45 mL Per Tube TID  . fiber  1 packet Per Tube BID  . free water  200 mL Per Tube Q6H  . Gerhardt's butt cream   Topical BID  . insulin aspart  0-20 Units Subcutaneous Q4H  . insulin glargine  10 Units Subcutaneous BID  . levothyroxine  125 mcg Per Tube QAC breakfast  . loperamide HCl  4 mg Per Tube BID  . mouth rinse  15  mL Mouth Rinse 10 times per day  . nutrition supplement (JUVEN)  1 packet Per Tube BID BM  . revefenacin  175 mcg Nebulization Daily    Antimicrobials: Anti-infectives (From admission, onward)   Start     Dose/Rate Route Frequency Ordered Stop   09/20/20 1000  cefTRIAXone (ROCEPHIN) 2 g in sodium chloride 0.9 % 100 mL IVPB        2 g 200 mL/hr over 30 Minutes Intravenous Every 24 hours 09/20/20 0821 09/25/20 0755   09/15/20 1830  ceFAZolin (ANCEF) IVPB 2g/100 mL premix        2 g 200 mL/hr over 30 Minutes Intravenous Every 8 hours 09/15/20 1733 09/16/20 0232   09/15/20 1616  vancomycin (VANCOCIN) powder  Status:  Discontinued          As needed 09/15/20 1616 09/15/20 1648   09/15/20 0800  ceFAZolin (ANCEF) IVPB 2g/100 mL premix        2 g 200 mL/hr over 30 Minutes  Intravenous To Short Stay 09/15/20 0704 09/15/20 1515   09/10/20 1300  ceFEPIme (MAXIPIME) 2 g in sodium chloride 0.9 % 100 mL IVPB  Status:  Discontinued        2 g 200 mL/hr over 30 Minutes Intravenous  Once 09/10/20 1245 09/10/20 1403   09/10/20 1300  metroNIDAZOLE (FLAGYL) IVPB 500 mg  Status:  Discontinued        500 mg 100 mL/hr over 60 Minutes Intravenous  Once 09/10/20 1245 09/10/20 1403   09/10/20 1300  vancomycin (VANCOCIN) IVPB 1000 mg/200 mL premix  Status:  Discontinued        1,000 mg 200 mL/hr over 60 Minutes Intravenous  Once 09/10/20 1245 09/10/20 1415      PRN meds: sodium chloride, acetaminophen **OR** acetaminophen, ALPRAZolam, loperamide HCl, ondansetron (ZOFRAN) IV, oxyCODONE, phenol   Objective: Vitals:   09/29/20 1135 09/29/20 1144  BP: 123/69 123/69  Pulse: 74 74  Resp: (!) 25 (!) 36  Temp: 98.7 F (37.1 C)   SpO2: 99% 94%    Intake/Output Summary (Last 24 hours) at 09/29/2020 1215 Last data filed at 09/29/2020 0600 Gross per 24 hour  Intake 1290 ml  Output 575 ml  Net 715 ml   Filed Weights   09/27/20 0500 09/28/20 0226 09/29/20 0500  Weight: 82.8 kg 82.8 kg 82.3 kg   Weight change: -0.5 kg Body mass index is 26.78 kg/m.   Physical Exam: General exam: Pleasant, elderly Caucasian male.  Not in distress Skin: No rashes, lesions or ulcers. HEENT: Atraumatic, normocephalic, no obvious bleeding.  Has tracheostomy tube anteriorly.  Dobbhoff tube in place Lungs: Clear to auscultation bilaterally CVS: Regular rate and rhythm, no murmur GI/Abd soft, nontender, nondistended, bowel sound present CNS: Nods his head and moves lips to answer questions.  Alert, awake, oriented x3 Psychiatry: Mood appropriate Extremities: No pedal edema, no calf tenderness  Data Review: I have personally reviewed the laboratory data and studies available.  Recent Labs  Lab 09/23/20 0153 09/28/20 0019  WBC 13.7* 11.7*  NEUTROABS  --  9.9*  HGB 9.4* 9.8*  HCT 29.2*  31.5*  MCV 94.8 97.5  PLT 309 400   Recent Labs  Lab 09/23/20 0153 09/24/20 0328 09/25/20 0445 09/26/20 0337 09/28/20 0019  NA 136 136 133* 136 136  K 4.2 3.8 3.6 4.5 5.0  CL 96* 95* 91* 94* 94*  CO2 33* 32 33* 37* 36*  GLUCOSE 167* 129* 132* 169* 204*  BUN 34* 42* 46* 50*  50*  CREATININE 1.12 1.11 1.28* 1.22 1.10  CALCIUM 8.3* 8.3* 8.5* 8.3* 8.7*  MG  --   --   --   --  2.0  PHOS  --   --   --   --  3.2    F/u labs ordered Unresulted Labs (From admission, onward)         None      Signed, Terrilee Croak, MD [facility] 09/29/2020

## 2020-09-30 ENCOUNTER — Inpatient Hospital Stay (HOSPITAL_COMMUNITY): Payer: Medicare Other

## 2020-09-30 DIAGNOSIS — J9622 Acute and chronic respiratory failure with hypercapnia: Secondary | ICD-10-CM | POA: Diagnosis not present

## 2020-09-30 DIAGNOSIS — J9621 Acute and chronic respiratory failure with hypoxia: Secondary | ICD-10-CM | POA: Diagnosis not present

## 2020-09-30 LAB — CBC WITH DIFFERENTIAL/PLATELET
Abs Immature Granulocytes: 0.05 10*3/uL (ref 0.00–0.07)
Basophils Absolute: 0.1 10*3/uL (ref 0.0–0.1)
Basophils Relative: 0 %
Eosinophils Absolute: 0 10*3/uL (ref 0.0–0.5)
Eosinophils Relative: 0 %
HCT: 27.6 % — ABNORMAL LOW (ref 39.0–52.0)
Hemoglobin: 8.5 g/dL — ABNORMAL LOW (ref 13.0–17.0)
Immature Granulocytes: 0 %
Lymphocytes Relative: 8 %
Lymphs Abs: 1.1 10*3/uL (ref 0.7–4.0)
MCH: 29.6 pg (ref 26.0–34.0)
MCHC: 30.8 g/dL (ref 30.0–36.0)
MCV: 96.2 fL (ref 80.0–100.0)
Monocytes Absolute: 1.3 10*3/uL — ABNORMAL HIGH (ref 0.1–1.0)
Monocytes Relative: 10 %
Neutro Abs: 10.6 10*3/uL — ABNORMAL HIGH (ref 1.7–7.7)
Neutrophils Relative %: 82 %
Platelets: 380 10*3/uL (ref 150–400)
RBC: 2.87 MIL/uL — ABNORMAL LOW (ref 4.22–5.81)
RDW: 13.4 % (ref 11.5–15.5)
WBC: 13.1 10*3/uL — ABNORMAL HIGH (ref 4.0–10.5)
nRBC: 0 % (ref 0.0–0.2)

## 2020-09-30 LAB — GLUCOSE, CAPILLARY
Glucose-Capillary: 148 mg/dL — ABNORMAL HIGH (ref 70–99)
Glucose-Capillary: 171 mg/dL — ABNORMAL HIGH (ref 70–99)
Glucose-Capillary: 194 mg/dL — ABNORMAL HIGH (ref 70–99)
Glucose-Capillary: 218 mg/dL — ABNORMAL HIGH (ref 70–99)
Glucose-Capillary: 239 mg/dL — ABNORMAL HIGH (ref 70–99)
Glucose-Capillary: 58 mg/dL — ABNORMAL LOW (ref 70–99)

## 2020-09-30 LAB — BASIC METABOLIC PANEL
Anion gap: 7 (ref 5–15)
BUN: 51 mg/dL — ABNORMAL HIGH (ref 8–23)
CO2: 36 mmol/L — ABNORMAL HIGH (ref 22–32)
Calcium: 8.8 mg/dL — ABNORMAL LOW (ref 8.9–10.3)
Chloride: 92 mmol/L — ABNORMAL LOW (ref 98–111)
Creatinine, Ser: 1.16 mg/dL (ref 0.61–1.24)
GFR, Estimated: >60 mL/min (ref >60)
Glucose, Bld: 243 mg/dL — ABNORMAL HIGH (ref 70–99)
Potassium: 5.1 mmol/L (ref 3.5–5.1)
Sodium: 135 mmol/L (ref 135–145)

## 2020-09-30 MED ORDER — INSULIN GLARGINE 100 UNIT/ML ~~LOC~~ SOLN
12.0000 [IU] | Freq: Two times a day (BID) | SUBCUTANEOUS | Status: DC
  Administered 2020-09-30 – 2020-10-06 (×12): 12 [IU] via SUBCUTANEOUS
  Filled 2020-09-30 (×13): qty 0.12

## 2020-09-30 MED ORDER — IOHEXOL 300 MG/ML  SOLN
100.0000 mL | Freq: Once | INTRAMUSCULAR | Status: AC | PRN
  Administered 2020-09-30: 100 mL via INTRAVENOUS

## 2020-09-30 MED ORDER — DEXTROSE 50 % IV SOLN
INTRAVENOUS | Status: AC
  Administered 2020-09-30: 50 mL
  Filled 2020-09-30: qty 50

## 2020-09-30 MED ORDER — LOPERAMIDE HCL 1 MG/7.5ML PO SUSP
4.0000 mg | Freq: Three times a day (TID) | ORAL | Status: DC
  Administered 2020-09-30 – 2020-10-04 (×12): 4 mg
  Filled 2020-09-30 (×12): qty 30

## 2020-09-30 NOTE — Plan of Care (Signed)
  Problem: Education: Goal: Knowledge of General Education information will improve Description Including pain rating scale, medication(s)/side effects and non-pharmacologic comfort measures Outcome: Progressing   Problem: Health Behavior/Discharge Planning: Goal: Ability to manage health-related needs will improve Outcome: Progressing   

## 2020-09-30 NOTE — Progress Notes (Signed)
PROGRESS NOTE  Devin [name]  DOB: 03-07-1941  PCP: Prince Solian, MD TMH:[id number]  DOA: 09/10/2020  LOS: 20 days   Chief Complaint  Patient presents with  . Back Pain  . Vomiting   Brief narrative: Devin [name] is a 80 y.o. male with PMH significant for DM2, HTN, A. 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  On 3/31, patient fell while shopping at a bookstore.  No head trauma or LOC but had a new onset back pain.  4/1, seen by PCP and underwent thoracic, lumbar, and chest x-ray. Found to have new T11 Chance fracture.He also has a stable L1 compression fracture, extensive lumbar spondylosis and postsurgical changes. Additionally hada left seventh rib fracture anteriorly and a left pleural effusion without any obvious pneumothorax.  Pain control was tried as an outpatient with hydrocodone and Robaxin but it became intolerable.  Additionally, he was not able to use his incentive spirometer because of chest pain from rib fracture.  He continued to have progressive shortness of breath and hence presented to ED on 4/3 with excruciating pain, agitation, shortness of breath.  In the ED, patient required intubation and admitted to ICU. Seen by neurosurgery.  Significant Hospital events  4/8, neurosurgery did laking of the prior rod systems (T7-11 and T11-L4 rods) across T11 fracture.  4/9- failed attempt extubation. Decadron X 4 doses for airway edema   4/10 IVFs increased for mild Cr bump  4/11 Failed extubation 2/2 progressive weakness, worse cough mechanics (essentially just fatigued and progressed to respiratory failure) re-intubated. Sputum sent post re-intubation   4/12 trach at bedside.   4/13 sputum grew Klebsiella. Low gd temp/ongoing leukocytosis so Ceftriaxone started. GI consulted for  diarrhea in context of known chron's   4/14-4/18 -gradually transition to trach collar  4/20 transferred out to hospitalist service  4/21-insurance denied CIR transfer  4/22 failed barium swallow evaluation  Subjective: Patient was seen and examined this morning.  Lying on bed.  Not in distress.  Respiratory sound clear but patient feels like he has a lot of secretion that he cannot clear up.  Asking for respiratory therapist to do suctioning through tracheostomy.   Family at bedside.   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  Reviewed extubation trials in ICU.  Underwent tracheostomy on 4/12 -Currently on 5 L oxygen by tracheostomy tube -Continue tracheostomy care.  Continue frequent suctioning.  Klebsiella pneumonia Small loculated left effusion -Completed treatment with IV ceftriaxone -Continue yupelri and Pulmicort  Dysphagia -Currently has tube feeding through Dobbhoff.  Speech therapy following.   -4/22, underwent barium swallow study.  Per speech therapy note, he seems to have a thickening of the prevertebral tissue at the level of C4-5.  This protuberance inhibits full epiglottic closure over the larynx and directs the bolus material into the laryngeal vestibule.  With PMV in place, the pt had a strong cough response and ejected the barium effectively; however, it tended to fall immediately back into the larynx and portions were subsequently aspirated. Oral phase was [facility]. She discussed this with the radiologist who recommended to obtain a CT cervical spine with contrast.  Ordered yesterday.  Acute anemia of critical illness -Based hemoglobin more than 11.  For the last 1 week, patient hemoglobin  has been running between 9-10.  Down to 8.5 today.  No active bleeding.  Continue  to monitor.  Obtain anemia panel. Recent Labs    09/21/20 0232 09/22/20 0317 09/23/20 0153 09/28/20 0019 09/30/20 0324  HGB 9.8* 9.8* 9.4* 9.8* 8.5*  MCV 94.1 91.7 94.8 97.5 96.2    Acute kidney injury  -Likely due to aggressive diuresis and diarrhea -Creatinine has normalized now. -Diuretics on hold. -On free water 200 mL every 6 hours through G-tube. -Closely watch fluid balance Recent Labs    09/19/20 0420 09/20/20 0453 09/21/20 0801 09/22/20 0317 09/23/20 0153 09/24/20 0328 09/25/20 0445 09/26/20 0337 09/28/20 0019 09/30/20 0324  BUN 31* 26* 20 24* 34* 42* 46* 50* 50* 51*  CREATININE 1.14 0.99 0.95 1.02 1.12 1.11 1.28* 1.22 1.10 1.16   Paroxysmal Afib  -Was in RVR intermittently in the hospital. -Currently on normal sinus rhythm on atenolol and diltiazem.  Essential hypertension -Blood pressure controlled on atenolol and diltiazem.  Diuretics on hold.  Type 2 diabetes mellitus -A1c 6.4 on 4/3. -Currently on Lantus 10 units twice daily.  Blood sugar had mostly remained stable but in last 24 hours, it has been mostly over 200.  I will increase Lantus dose to 12 units twice daily. -Continue sliding scale insulin with Accu-Cheks Recent Labs  Lab 09/29/20 1620 09/29/20 1956 09/29/20 2355 09/30/20 0326 09/30/20 0739  GLUCAP 162* 229* 194* 239* 218*   Acute diarrhea History of Crohn's disease -Per patient's wife prior to admission, patient was on mesalamine 1600 mg twice daily and as needed loperamide.  Patient is currently not able to get mesalamine because it cannot be crushed.  -He is also on liquid/tube feeding probably making diarrhea worse.  Currently on Imodium and fiber.  Increase Imodium to 3 times daily.  Continue stool volume monitoring with Flexi-Seal.  T11-T12 Chance fracture -Status postsurgical stabilization by neurosurgery -Continue PT/OT  History of ankylosing spondylitis, kyphoscoliosis -Patient's wife, patient was physically functional  prior to this hospitalization.  Continue PT/OT  History of adenocarcinoma s/p LUL lobectomy (2016) History of thyroid and prostate cancer Generalized frailty, recent 40 pound weight loss over 6 to 12 months -Raises concern for return of malignancy. -Continue outpatient follow-up with pulmonology.  Hypothyroidism Continue Synthroid   Impaired mobility -PT eval obtained.  CIR recommended.  But it was denied by his insurance.  Pending a file for reauthorization/appeal.  Mobility: CIR recommended Code Status:   Code Status: Full Code  Nutritional status: Body mass index is 26.78 kg/m. Nutrition Problem: Inadequate oral intake Etiology: inability to eat Signs/Symptoms: NPO status Diet Order            Diet NPO time specified  Diet effective now                 DVT prophylaxis: SCD's Start: 09/15/20 1734 apixaban (ELIQUIS) tablet 5 mg   Antimicrobials:  None Fluid: Free water through tube feeding.  Not on IV fluid. Consultants: Critical care Family Communication:  Not at bedside  Status is: Inpatient  Remains inpatient appropriate because: New tracheostomy, has feeding through [place].   Dispo: The patient is from: Home              Anticipated d/c is to: CIR if authorized by insurance.              Patient currently is not medically stable to d/c.   Difficult to place patient No    Infusions:  . sodium chloride 250 mL (09/23/20  1109)  . feeding supplement (OSMOLITE 1.5 CAL) 1,000 mL (09/29/20 1151)    Scheduled Meds: . acetaminophen (TYLENOL) oral liquid 160 mg/5 mL  500 mg Per Tube Q6H  . apixaban  5 mg Per Tube BID  . atenolol  25 mg Per NG tube BID  . bethanechol  5 mg Per NG tube QID  . budesonide (PULMICORT) nebulizer solution  0.5 mg Nebulization BID  . busPIRone  15 mg Per Tube BID  . chlorhexidine gluconate (MEDLINE KIT)  15 mL Mouth Rinse BID  . Chlorhexidine Gluconate Cloth  6 each Topical Q0600  . cholestyramine  4 g Oral BID AC  . diltiazem  30  mg Per Tube Q6H  . feeding supplement (PROSource TF)  45 mL Per Tube TID  . fiber  1 packet Per Tube BID  . free water  200 mL Per Tube Q6H  . Gerhardt's butt cream   Topical BID  . insulin aspart  0-20 Units Subcutaneous Q4H  . insulin glargine  12 Units Subcutaneous BID  . levothyroxine  125 mcg Per Tube QAC breakfast  . loperamide HCl  4 mg Per Tube BID  . mouth rinse  15 mL Mouth Rinse 10 times per day  . nutrition supplement (JUVEN)  1 packet Per Tube BID BM  . revefenacin  175 mcg Nebulization Daily    Antimicrobials: Anti-infectives (From admission, onward)   Start     Dose/Rate Route Frequency Ordered Stop   09/20/20 1000  cefTRIAXone (ROCEPHIN) 2 g in sodium chloride 0.9 % 100 mL IVPB        2 g 200 mL/hr over 30 Minutes Intravenous Every 24 hours 09/20/20 0821 09/25/20 0755   09/15/20 1830  ceFAZolin (ANCEF) IVPB 2g/100 mL premix        2 g 200 mL/hr over 30 Minutes Intravenous Every 8 hours 09/15/20 1733 09/16/20 0232   09/15/20 1616  vancomycin (VANCOCIN) powder  Status:  Discontinued          As needed 09/15/20 1616 09/15/20 1648   09/15/20 0800  ceFAZolin (ANCEF) IVPB 2g/100 mL premix        2 g 200 mL/hr over 30 Minutes Intravenous To Short Stay 09/15/20 0704 09/15/20 1515   09/10/20 1300  ceFEPIme (MAXIPIME) 2 g in sodium chloride 0.9 % 100 mL IVPB  Status:  Discontinued        2 g 200 mL/hr over 30 Minutes Intravenous  Once 09/10/20 1245 09/10/20 1403   09/10/20 1300  metroNIDAZOLE (FLAGYL) IVPB 500 mg  Status:  Discontinued        500 mg 100 mL/hr over 60 Minutes Intravenous  Once 09/10/20 1245 09/10/20 1403   09/10/20 1300  vancomycin (VANCOCIN) IVPB 1000 mg/200 mL premix  Status:  Discontinued        1,000 mg 200 mL/hr over 60 Minutes Intravenous  Once 09/10/20 1245 09/10/20 1415      PRN meds: sodium chloride, acetaminophen **OR** acetaminophen, ALPRAZolam, loperamide HCl, ondansetron (ZOFRAN) IV, oxyCODONE, phenol   Objective: Vitals:   09/30/20 0741  09/30/20 0800  BP: (!) 118/49 (!) 129/50  Pulse: 73 74  Resp: (!) 25 (!) 23  Temp: 97.9 F (36.6 C)   SpO2: 96% 99%    Intake/Output Summary (Last 24 hours) at 09/30/2020 1031 Last data filed at 09/29/2020 2300 Gross per 24 hour  Intake --  Output 1000 ml  Net -1000 ml   Filed Weights   09/27/20 0500 09/28/20 0226 09/29/20 0500  Weight: 82.8 kg 82.8 kg 82.3 kg   Weight change:  Body mass index is 26.78 kg/m.   Physical Exam: General exam: Pleasant, elderly Caucasian male.  Not in distress Skin: No rashes, lesions or ulcers. HEENT: Atraumatic, normocephalic, no obvious bleeding.  Has tracheostomy tube anteriorly.  Dobbhoff tube in place Lungs: Clear to auscultation bilaterally CVS: Regular rate and rhythm, no murmur GI/Abd soft, mild generalized tenderness with Crohn's disease, bowel sound present CNS: Nods his head and moves lips to answer questions.  Alert, awake, oriented x3 Psychiatry: Mood appropriate Extremities: No pedal edema, no calf tenderness  Data Review: I have personally reviewed the laboratory data and studies available.  Recent Labs  Lab 09/28/20 0019 09/30/20 0324  WBC 11.7* 13.1*  NEUTROABS 9.9* 10.6*  HGB 9.8* 8.5*  HCT 31.5* 27.6*  MCV 97.5 96.2  PLT 400 380   Recent Labs  Lab 09/24/20 0328 09/25/20 0445 09/26/20 0337 09/28/20 0019 09/30/20 0324  NA 136 133* 136 136 135  K 3.8 3.6 4.5 5.0 5.1  CL 95* 91* 94* 94* 92*  CO2 32 33* 37* 36* 36*  GLUCOSE 129* 132* 169* 204* 243*  BUN 42* 46* 50* 50* 51*  CREATININE 1.11 1.28* 1.22 1.10 1.16  CALCIUM 8.3* 8.5* 8.3* 8.7* 8.8*  MG  --   --   --  2.0  --   PHOS  --   --   --  3.2  --     F/u labs ordered Unresulted Labs (From admission, onward)          Start     Ordered   10/01/20 0500  Vitamin B12  (Anemia Panel (PNL))  Tomorrow morning,   R       Question:  Specimen collection method  Answer:  Lab=Lab collect   09/30/20 1031   10/01/20 0500  Folate  (Anemia Panel (PNL))  Tomorrow  morning,   R       Question:  Specimen collection method  Answer:  Lab=Lab collect   09/30/20 1031   10/01/20 0500  Iron and TIBC  (Anemia Panel (PNL))  Tomorrow morning,   R       Question:  Specimen collection method  Answer:  Lab=Lab collect   09/30/20 1031   10/01/20 0500  Ferritin  (Anemia Panel (PNL))  Tomorrow morning,   R       Question:  Specimen collection method  Answer:  Lab=Lab collect   09/30/20 1031   10/01/20 0500  Reticulocytes  (Anemia Panel (PNL))  Tomorrow morning,   R       Question:  Specimen collection method  Answer:  Lab=Lab collect   09/30/20 1031          Signed, Terrilee Croak, MD [facility] 09/30/2020

## 2020-10-01 DIAGNOSIS — J9621 Acute and chronic respiratory failure with hypoxia: Secondary | ICD-10-CM | POA: Diagnosis not present

## 2020-10-01 DIAGNOSIS — J9622 Acute and chronic respiratory failure with hypercapnia: Secondary | ICD-10-CM | POA: Diagnosis not present

## 2020-10-01 LAB — BASIC METABOLIC PANEL
Anion gap: 9 (ref 5–15)
BUN: 50 mg/dL — ABNORMAL HIGH (ref 8–23)
CO2: 30 mmol/L (ref 22–32)
Calcium: 8.5 mg/dL — ABNORMAL LOW (ref 8.9–10.3)
Chloride: 94 mmol/L — ABNORMAL LOW (ref 98–111)
Creatinine, Ser: 1.15 mg/dL (ref 0.61–1.24)
GFR, Estimated: >60 mL/min (ref >60)
Glucose, Bld: 206 mg/dL — ABNORMAL HIGH (ref 70–99)
Potassium: 5.2 mmol/L — ABNORMAL HIGH (ref 3.5–5.1)
Sodium: 133 mmol/L — ABNORMAL LOW (ref 135–145)

## 2020-10-01 LAB — IRON AND TIBC
Iron: 26 ug/dL — ABNORMAL LOW (ref 45–182)
Saturation Ratios: 7 % — ABNORMAL LOW (ref 17.9–39.5)
TIBC: 349 ug/dL (ref 250–450)
UIBC: 323 ug/dL

## 2020-10-01 LAB — CBC WITH DIFFERENTIAL/PLATELET
Abs Immature Granulocytes: 0.06 10*3/uL (ref 0.00–0.07)
Basophils Absolute: 0.1 10*3/uL (ref 0.0–0.1)
Basophils Relative: 0 %
Eosinophils Absolute: 0.1 10*3/uL (ref 0.0–0.5)
Eosinophils Relative: 1 %
HCT: 26.6 % — ABNORMAL LOW (ref 39.0–52.0)
Hemoglobin: 8.4 g/dL — ABNORMAL LOW (ref 13.0–17.0)
Immature Granulocytes: 1 %
Lymphocytes Relative: 9 %
Lymphs Abs: 1.1 10*3/uL (ref 0.7–4.0)
MCH: 30.1 pg (ref 26.0–34.0)
MCHC: 31.6 g/dL (ref 30.0–36.0)
MCV: 95.3 fL (ref 80.0–100.0)
Monocytes Absolute: 1.1 10*3/uL — ABNORMAL HIGH (ref 0.1–1.0)
Monocytes Relative: 9 %
Neutro Abs: 9.9 10*3/uL — ABNORMAL HIGH (ref 1.7–7.7)
Neutrophils Relative %: 80 %
Platelets: 320 10*3/uL (ref 150–400)
RBC: 2.79 MIL/uL — ABNORMAL LOW (ref 4.22–5.81)
RDW: 13.5 % (ref 11.5–15.5)
WBC: 12.3 10*3/uL — ABNORMAL HIGH (ref 4.0–10.5)
nRBC: 0 % (ref 0.0–0.2)

## 2020-10-01 LAB — RETICULOCYTES
Immature Retic Fract: 23.3 % — ABNORMAL HIGH (ref 2.3–15.9)
RBC.: 2.82 MIL/uL — ABNORMAL LOW (ref 4.22–5.81)
Retic Count, Absolute: 69.1 10*3/uL (ref 19.0–186.0)
Retic Ct Pct: 2.5 % (ref 0.4–3.1)

## 2020-10-01 LAB — FOLATE: Folate: 11.6 ng/mL (ref >5.9)

## 2020-10-01 LAB — VITAMIN B12: Vitamin B-12: 397 pg/mL (ref 180–914)

## 2020-10-01 LAB — GLUCOSE, CAPILLARY
Glucose-Capillary: 185 mg/dL — ABNORMAL HIGH (ref 70–99)
Glucose-Capillary: 180 mg/dL — ABNORMAL HIGH (ref 70–99)
Glucose-Capillary: 151 mg/dL — ABNORMAL HIGH (ref 70–99)
Glucose-Capillary: 175 mg/dL — ABNORMAL HIGH (ref 70–99)
Glucose-Capillary: 174 mg/dL — ABNORMAL HIGH (ref 70–99)

## 2020-10-01 LAB — FERRITIN: Ferritin: 119 ng/mL (ref 24–336)

## 2020-10-01 MED ORDER — CHOLESTYRAMINE 4 G PO PACK
4.0000 g | PACK | Freq: Two times a day (BID) | ORAL | Status: DC
  Administered 2020-10-01 – 2020-10-06 (×4): 4 g
  Filled 2020-10-01 (×11): qty 1

## 2020-10-01 MED ORDER — CHLORHEXIDINE GLUCONATE 0.12 % MT SOLN
15.0000 mL | Freq: Two times a day (BID) | OROMUCOSAL | Status: DC
  Administered 2020-10-01 – 2020-10-06 (×8): 15 mL via OROMUCOSAL
  Filled 2020-10-01 (×10): qty 15

## 2020-10-01 NOTE — Plan of Care (Signed)
  Problem: Education: Goal: Knowledge of General Education information will improve Description: Including pain rating scale, medication(s)/side effects and non-pharmacologic comfort measures Outcome: Progressing   Problem: Nutrition: Goal: Adequate nutrition will be maintained Outcome: Progressing   

## 2020-10-01 NOTE — Progress Notes (Signed)
PROGRESS NOTE  Devin [name]  DOB: Oct 27, 1940  PCP: Devin Solian, MD SAY:[id number]  DOA: 09/10/2020  LOS: 21 days   Chief Complaint  Patient presents with  . Back Pain  . Vomiting   Brief narrative: Devin [name] is a 80 y.o. male with PMH significant for DM2, HTN, A. 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  On 3/31, patient fell while shopping at a bookstore.  No head trauma or LOC but had a new onset back pain.  4/1, seen by PCP and underwent thoracic, lumbar, and chest x-ray. Found to have new T11 Chance fracture.He also has a stable L1 compression fracture, extensive lumbar spondylosis and postsurgical changes. Additionally hada left seventh rib fracture anteriorly and a left pleural effusion without any obvious pneumothorax.  Pain control was tried as an outpatient with hydrocodone and Robaxin but it became intolerable.  Additionally, he was not able to use his incentive spirometer because of chest pain from rib fracture.  He continued to have progressive shortness of breath and hence presented to ED on 4/3 with excruciating pain, agitation, shortness of breath.  In the ED, patient required intubation and admitted to ICU. Seen by neurosurgery.  Significant Hospital events  4/8, neurosurgery did laking of the prior rod systems (T7-11 and T11-L4 rods) across T11 fracture.  4/9- failed attempt extubation. Decadron X 4 doses for airway edema   4/10 IVFs increased for mild Cr bump  4/11 Failed extubation 2/2 progressive weakness, worse cough mechanics (essentially just fatigued and progressed to respiratory failure) re-intubated. Sputum sent post re-intubation   4/12 trach at bedside.   4/13 sputum grew Klebsiella. Low gd temp/ongoing leukocytosis so Ceftriaxone started. GI consulted for  diarrhea in context of known chron's   4/14-4/18 -gradually transition to trach collar  4/20 transferred out to hospitalist service  4/21-insurance denied CIR transfer  4/22 failed barium swallow evaluation  4/23-CT scan of neck soft tissue was obtained which raised suspicion of laryngeal carcinoma.  Subjective: Patient was seen and examined this morning.  Lying on bed.  Not in distress.  Alert, awake, nods head and moves lips to communicate. Wife and daughter at bedside. We discussed about the CT scan neck findings suspicious of laryngeal carcinoma.  To be seen by ENT tomorrow.  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  Reviewed extubation trials in ICU.  Underwent tracheostomy on 4/12 -Currently on 5 L oxygen by tracheostomy tube -Continue tracheostomy care.  Continue frequent suctioning.  Klebsiella pneumonia Small loculated left effusion -Completed treatment with IV ceftriaxone -Continue yupelri and Pulmicort  Dysphagia -Currently has tube feeding through Dobbhoff.  Speech therapy following.    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  Per speech therapy note, he seems to have a thickening of the prevertebral tissue at the level of C4-5.   -4/23, CT scan soft tissue neck was obtained which raised suspicion of laryngeal carcinoma.  Will discuss with ENT tomorrow. -Hold Eliquis for potential need of surgical intervention.  Acute anemia of critical illness -Based hemoglobin more than 11.  For the last 1 week, patient hemoglobin has been running between 9-10.  -Lower but stable between 8-9 for last few days.  No active bleeding.  Continue to monitor.  Recent Labs    09/22/20 [phone]  09/23/20 0153 09/28/20 0019 09/30/20 0324 10/01/20 0346  HGB 9.8* 9.4* 9.8* 8.5* 8.4*  MCV  91.7 94.8 97.5 96.2 95.3  VITAMINB12  --   --   --   --  397  FOLATE  --   --   --   --  11.6  FERRITIN  --   --   --   --  119  TIBC  --   --   --   --  349  IRON  --   --   --   --  26*  RETICCTPCT  --   --   --   --  2.5    Acute kidney injury  -Likely due to aggressive diuresis and diarrhea -Creatinine has normalized now. -Diuretics on hold. -On free water 200 mL every 6 hours through G-tube. -Closely watch fluid balance Recent Labs    09/20/20 0453 09/21/20 0801 09/22/20 0317 09/23/20 0153 09/24/20 0328 09/25/20 0445 09/26/20 0337 09/28/20 0019 09/30/20 0324 10/01/20 0346  BUN 26* 20 24* 34* 42* 46* 50* 50* 51* 50*  CREATININE 0.99 0.95 1.02 1.12 1.11 1.28* 1.22 1.10 1.16 1.15   Paroxysmal Afib  -Was in RVR intermittently in the hospital. -Currently on normal sinus rhythm on atenolol and diltiazem. -Keep Eliquis on hold  Essential hypertension -Blood pressure controlled on atenolol and diltiazem.  Diuretics on hold.  Type 2 diabetes mellitus -A1c 6.4 on 4/3. -Currently blood sugars controlled on Lantus 12 units twice daily.   -Continue sliding scale insulin with Accu-Cheks Recent Labs  Lab 09/30/20 1545 09/30/20 2322 10/01/20 0607 10/01/20 0907 10/01/20 1222  GLUCAP 58* 171* 185* 180* 151*   Acute diarrhea History of Crohn's disease -Per patient's wife prior to admission, patient was on mesalamine 1600 mg twice daily and as needed loperamide.  Patient is currently not able to get mesalamine because it cannot be crushed.  -He is also on liquid/tube feeding probably making diarrhea worse.  Currently on Imodium scheduled 3 times daily and fiber.   T11-T12 Chance fracture -Status postsurgical stabilization by neurosurgery -Continue PT/OT  History of ankylosing spondylitis, kyphoscoliosis -Patient's wife, patient was physically functional prior to this hospitalization.  Continue PT/OT  History of adenocarcinoma s/p LUL lobectomy (2016) History of  thyroid and prostate cancer -Continue outpatient follow-up with pulmonology.  Hypothyroidism Continue Synthroid   Impaired mobility -PT eval obtained.  CIR recommended.  But it was denied by his insurance.   Mobility: CIR recommended Code Status:   Code Status: Full Code  Nutritional status: Body mass index is 26.78 kg/m. Nutrition Problem: Inadequate oral intake Etiology: inability to eat Signs/Symptoms: NPO status Diet Order            Diet NPO time specified  Diet effective now                 DVT prophylaxis: SCD's Start: 09/15/20 1734   Antimicrobials:  None Fluid: Free water through tube feeding.  Not on IV fluid. Consultants: Critical care Family Communication:  Not at bedside  Status is: Inpatient  Remains inpatient appropriate because: New tracheostomy, has feeding through [facility].   Dispo: The patient is from: Home              Anticipated d/c is to: CIR if authorized by insurance.              Patient currently is not medically stable to d/c.   Difficult to place patient No    Infusions:  . sodium  chloride 250 mL (09/23/20 1109)  . feeding supplement (OSMOLITE 1.5 CAL) 1,000 mL (09/30/20 1621)    Scheduled Meds: . acetaminophen (TYLENOL) oral liquid 160 mg/5 mL  500 mg Per Tube Q6H  . atenolol  25 mg Per NG tube BID  . bethanechol  5 mg Per NG tube QID  . budesonide (PULMICORT) nebulizer solution  0.5 mg Nebulization BID  . busPIRone  15 mg Per Tube BID  . chlorhexidine gluconate (MEDLINE KIT)  15 mL Mouth Rinse BID  . Chlorhexidine Gluconate Cloth  6 each Topical Q0600  . cholestyramine  4 g Per Tube BID AC  . diltiazem  30 mg Per Tube Q6H  . feeding supplement (PROSource TF)  45 mL Per Tube TID  . fiber  1 packet Per Tube BID  . free water  200 mL Per Tube Q6H  . Gerhardt's butt cream   Topical BID  . insulin aspart  0-20 Units Subcutaneous Q4H  . insulin glargine  12 Units Subcutaneous BID  . levothyroxine  125 mcg Per Tube QAC breakfast   . loperamide HCl  4 mg Per Tube TID  . mouth rinse  15 mL Mouth Rinse 10 times per day  . nutrition supplement (JUVEN)  1 packet Per Tube BID BM  . revefenacin  175 mcg Nebulization Daily    Antimicrobials: Anti-infectives (From admission, onward)   Start     Dose/Rate Route Frequency Ordered Stop   09/20/20 1000  cefTRIAXone (ROCEPHIN) 2 g in sodium chloride 0.9 % 100 mL IVPB        2 g 200 mL/hr over 30 Minutes Intravenous Every 24 hours 09/20/20 0821 09/25/20 0755   09/15/20 1830  ceFAZolin (ANCEF) IVPB 2g/100 mL premix        2 g 200 mL/hr over 30 Minutes Intravenous Every 8 hours 09/15/20 1733 09/16/20 0232   09/15/20 1616  vancomycin (VANCOCIN) powder  Status:  Discontinued          As needed 09/15/20 1616 09/15/20 1648   09/15/20 0800  ceFAZolin (ANCEF) IVPB 2g/100 mL premix        2 g 200 mL/hr over 30 Minutes Intravenous To Short Stay 09/15/20 0704 09/15/20 1515   09/10/20 1300  ceFEPIme (MAXIPIME) 2 g in sodium chloride 0.9 % 100 mL IVPB  Status:  Discontinued        2 g 200 mL/hr over 30 Minutes Intravenous  Once 09/10/20 1245 09/10/20 1403   09/10/20 1300  metroNIDAZOLE (FLAGYL) IVPB 500 mg  Status:  Discontinued        500 mg 100 mL/hr over 60 Minutes Intravenous  Once 09/10/20 1245 09/10/20 1403   09/10/20 1300  vancomycin (VANCOCIN) IVPB 1000 mg/200 mL premix  Status:  Discontinued        1,000 mg 200 mL/hr over 60 Minutes Intravenous  Once 09/10/20 1245 09/10/20 1415      PRN meds: sodium chloride, acetaminophen **OR** acetaminophen, ALPRAZolam, loperamide HCl, ondansetron (ZOFRAN) IV, oxyCODONE, phenol   Objective: Vitals:   10/01/20 1134 10/01/20 1230  BP:  (!) 128/54  Pulse: 74 74  Resp: (!) 33 (!) 24  Temp:  (!) 97.5 F (36.4 C)  SpO2: 94% 94%    Intake/Output Summary (Last 24 hours) at 10/01/2020 1332 Last data filed at 10/01/2020 4142 Gross per 24 hour  Intake --  Output 1500 ml  Net -1500 ml   Filed Weights   09/27/20 0500 09/28/20 0226  09/29/20 0500  Weight: 82.8 kg  82.8 kg 82.3 kg   Weight change:  Body mass index is 26.78 kg/m.   Physical Exam: General exam: Pleasant, elderly Caucasian male.  Not in distress Skin: No rashes, lesions or ulcers. HEENT: Atraumatic, normocephalic, no obvious bleeding.  Has tracheostomy tube anteriorly.  Dobbhoff tube in place Lungs: Clear to auscultation bilaterally CVS: Regular rate and rhythm, no murmur GI/Abd soft, mild generalized tenderness with Crohn's disease, bowel sound present CNS: Nods his head and moves lips to answer questions.  Alert, awake, oriented x3 Psychiatry: Mood appropriate.  Extremities: No pedal edema, no calf tenderness  Data Review: I have personally reviewed the laboratory data and studies available.  Recent Labs  Lab 09/28/20 0019 09/30/20 0324 10/01/20 0346  WBC 11.7* 13.1* 12.3*  NEUTROABS 9.9* 10.6* 9.9*  HGB 9.8* 8.5* 8.4*  HCT 31.5* 27.6* 26.6*  MCV 97.5 96.2 95.3  PLT 400 380 320   Recent Labs  Lab 09/25/20 0445 09/26/20 0337 09/28/20 0019 09/30/20 0324 10/01/20 0346  NA 133* 136 136 135 133*  K 3.6 4.5 5.0 5.1 5.2*  CL 91* 94* 94* 92* 94*  CO2 33* 37* 36* 36* 30  GLUCOSE 132* 169* 204* 243* 206*  BUN 46* 50* 50* 51* 50*  CREATININE 1.28* 1.22 1.10 1.16 1.15  CALCIUM 8.5* 8.3* 8.7* 8.8* 8.5*  MG  --   --  2.0  --   --   PHOS  --   --  3.2  --   --     F/u labs ordered Unresulted Labs (From admission, onward)          Start     Ordered   10/01/20 0500  CBC with Differential/Platelet  Daily,   R     Question:  Specimen collection method  Answer:  Lab=Lab collect   09/30/20 1034   10/01/20 7989  Basic metabolic panel  Daily,   R     Question:  Specimen collection method  Answer:  Lab=Lab collect   09/30/20 1034          Signed, Terrilee Croak, MD [facility] 10/01/2020

## 2020-10-01 NOTE — Plan of Care (Signed)
  Problem: Education: Goal: Knowledge of General Education information will improve Description Including pain rating scale, medication(s)/side effects and non-pharmacologic comfort measures Outcome: Progressing   Problem: Health Behavior/Discharge Planning: Goal: Ability to manage health-related needs will improve Outcome: Progressing   

## 2020-10-02 DIAGNOSIS — J9621 Acute and chronic respiratory failure with hypoxia: Secondary | ICD-10-CM | POA: Diagnosis not present

## 2020-10-02 DIAGNOSIS — J9622 Acute and chronic respiratory failure with hypercapnia: Secondary | ICD-10-CM | POA: Diagnosis not present

## 2020-10-02 LAB — CBC WITH DIFFERENTIAL/PLATELET
Abs Immature Granulocytes: 0.07 10*3/uL (ref 0.00–0.07)
Basophils Absolute: 0.1 10*3/uL (ref 0.0–0.1)
Basophils Relative: 0 %
Eosinophils Absolute: 0.1 10*3/uL (ref 0.0–0.5)
Eosinophils Relative: 1 %
HCT: 24.9 % — ABNORMAL LOW (ref 39.0–52.0)
Hemoglobin: 7.7 g/dL — ABNORMAL LOW (ref 13.0–17.0)
Immature Granulocytes: 1 %
Lymphocytes Relative: 8 %
Lymphs Abs: 1 10*3/uL (ref 0.7–4.0)
MCH: 29.2 pg (ref 26.0–34.0)
MCHC: 30.9 g/dL (ref 30.0–36.0)
MCV: 94.3 fL (ref 80.0–100.0)
Monocytes Absolute: 1.1 10*3/uL — ABNORMAL HIGH (ref 0.1–1.0)
Monocytes Relative: 9 %
Neutro Abs: 10 10*3/uL — ABNORMAL HIGH (ref 1.7–7.7)
Neutrophils Relative %: 81 %
Platelets: 378 10*3/uL (ref 150–400)
RBC: 2.64 MIL/uL — ABNORMAL LOW (ref 4.22–5.81)
RDW: 13.4 % (ref 11.5–15.5)
WBC: 12.3 10*3/uL — ABNORMAL HIGH (ref 4.0–10.5)
nRBC: 0 % (ref 0.0–0.2)

## 2020-10-02 LAB — BASIC METABOLIC PANEL
Anion gap: 5 (ref 5–15)
BUN: 48 mg/dL — ABNORMAL HIGH (ref 8–23)
CO2: 33 mmol/L — ABNORMAL HIGH (ref 22–32)
Calcium: 8.6 mg/dL — ABNORMAL LOW (ref 8.9–10.3)
Chloride: 92 mmol/L — ABNORMAL LOW (ref 98–111)
Creatinine, Ser: 1.13 mg/dL (ref 0.61–1.24)
GFR, Estimated: >60 mL/min (ref >60)
Glucose, Bld: 188 mg/dL — ABNORMAL HIGH (ref 70–99)
Potassium: 5.5 mmol/L — ABNORMAL HIGH (ref 3.5–5.1)
Sodium: 130 mmol/L — ABNORMAL LOW (ref 135–145)

## 2020-10-02 LAB — GLUCOSE, CAPILLARY
Glucose-Capillary: 115 mg/dL — ABNORMAL HIGH (ref 70–99)
Glucose-Capillary: 137 mg/dL — ABNORMAL HIGH (ref 70–99)
Glucose-Capillary: 165 mg/dL — ABNORMAL HIGH (ref 70–99)
Glucose-Capillary: 183 mg/dL — ABNORMAL HIGH (ref 70–99)
Glucose-Capillary: 184 mg/dL — ABNORMAL HIGH (ref 70–99)
Glucose-Capillary: 210 mg/dL — ABNORMAL HIGH (ref 70–99)

## 2020-10-02 MED ORDER — DIPHENHYDRAMINE HCL 25 MG PO CAPS
25.0000 mg | ORAL_CAPSULE | Freq: Every evening | ORAL | Status: DC | PRN
  Administered 2020-10-02 – 2020-10-03 (×2): 25 mg via NASOGASTRIC
  Filled 2020-10-02 (×2): qty 1

## 2020-10-02 MED ORDER — OXYCODONE HCL 5 MG/5ML PO SOLN
5.0000 mg | ORAL | Status: DC | PRN
  Administered 2020-10-02 – 2020-10-05 (×4): 5 mg
  Filled 2020-10-02 (×4): qty 5

## 2020-10-02 MED ORDER — DIPHENHYDRAMINE HCL 25 MG PO CAPS: 25.0000 mg | ORAL_CAPSULE | Freq: Every evening | ORAL | Status: DC | PRN

## 2020-10-02 NOTE — Progress Notes (Signed)
Inpatient Rehabilitation Admissions Coordinator   Noted ongoing medical work up . I will follow his progress to assist with planning dispo options as needed.  Danne Baxter, RN, MSN Rehab Admissions Coordinator [phone] 10/02/2020 3:01 PM

## 2020-10-02 NOTE — Consult Note (Signed)
ENT CONSULT:  Reason for Consult: Mass lesion on CT   Referring Physician:  Dr. Pietro Cassis  HPI: Devin [name] is an 80 y.o. male with past medical history of atrial fibrillation, anticoagulated on Eliquis, Crohn's disease, lung cancer status post RSXN, thyroid cancer status post total thyroidectomy and RAI, prostate cancer, type 2 diabetes mellitus, COPD, ankylosing spondylitis with chronic low back pain and gait disorder who was admitted following a fall on 09/07/2020.  He was admitted on 09/10/2020 with ongoing pain, shortness of breath and agitation.  He became unresponsive in the ED requiring intubation for acute on chronic respiratory failure.  A CT chest was obtained which demonstrated acute T11 vertebral fracture.  He underwent posterior thoracic fusion T11-T12 on 09/15/2020 and was unable to tolerate extubation x2.  He ultimately underwent tracheostomy performed by critical care on 09/19/2020.  He has been off vent since 09/25/2020.  Patient underwent modified barium swallow on 09/29/2020 which revealed pharyngeal dysphagia related to thickening of the prevertebral tissue at the level of C4-5.  He subsequently had a CT neck on 09/30/2020, which reported prevertebral soft tissue mass at the level of the supraglottic larynx, measuring approximately 2.6 x 1.0 x 2.8 cm concerning for malignant neoplasm.  ENT consulted to evaluate.  History obtained from chart review, patient and patient's daughter and wife at bedside.  Patient is a former smoker but quit over 20 years ago.  Patient's wife and daughter states that he was having some mild odynophagia prior to presentation at the hospital.  They deny any dysphonia.  They deny unintentional weight loss, nausea, vomiting.  Patient has been stable on trach collar for several days.  He has intermittently tolerated use of Passy-Muir valve, but endorses shortness of breath with finger occlusion at bedside today  Past Medical History:  Diagnosis Date  . A-fib ([place])    . Adenocarcinoma of left lung, stage 1 ([place]) 03/10/2015  . Ankylosing spondylitis ([place])    Diagnosed during lumbar fracture summer of 2016    . Crohn's disease ([place])   . Gout   . HIstory of basal cell cancer of face    THYROID CA HX  . History of kidney stones   . Hypertension   . Hypothyroidism   . Impotence   . 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 Obesity (BMI 30-39.9)   . Osteoporosis    Pt completed 5 years of fosamax in 2013     . Prostate cancer with recurrence    Treated with prostatectomy with recurrence 2012 with Lupron treatment now him   . Psoriasis   . Thyroid cancer ([place]) 1999   Treated with RAI and total thyroidectomy   . Type II diabetes mellitus ([place])     Past Surgical History:  Procedure Laterality Date  . ABDOMINAL EXPLORATION SURGERY     for small bowel obstruction  . APPENDECTOMY  03/2007  . BACK SURGERY    . BASAL CELL CARCINOMA EXCISION  "several"   "head"  . CARDIAC CATHETERIZATION  03/17/2003  . CHOLECYSTECTOMY N/A 05/07/2016   Procedure: LAPAROSCOPIC CHOLECYSTECTOMY;  Surgeon: Fanny Skates, MD;  Location: [facility];  Service: General;  Laterality: N/A;  . COLON SURGERY  03/2007   Resection of cecum, appendix, terminal ileum (approximately/notes 10/10/2010  . CYSTOSCOPY/URETEROSCOPY/HOLMIUM LASER/STENT PLACEMENT Right 07/03/2020   Procedure: CYSTOSCOPY/RETROGRADE/URETEROSCOPY REMOVAL OF BLADDER STONE;  Surgeon: Raynelle Bring, MD;  Location: [facility];  Service: Urology;  Laterality: Right;  . HERNIA REPAIR    .  LAMINECTOMY WITH POSTERIOR LATERAL ARTHRODESIS LEVEL 1 N/A 09/15/2020   Procedure: REVISION OF THORACOLUMBAR FUSION, ADDITION OF CROSS-CONNECTORS;  Surgeon: Eustace Moore, MD;  Location: [facility];  Service: Neurosurgery;  Laterality: N/A;  . LAPAROSCOPIC CHOLECYSTECTOMY  05/07/2016  . LAPAROSCOPIC LYSIS OF ADHESIONS  05/07/2016  . LAPAROSCOPIC LYSIS OF ADHESIONS N/A 05/07/2016   Procedure: LAPAROSCOPIC LYSIS OF ADHESIONS  TIMES ONE HOUR;  Surgeon: Fanny Skates, MD;  Location: [facility];  Service: General;  Laterality: N/A;  . POSTERIOR FUSION THORACIC SPINE  02/08/2016   1. Posterior thoracic arthrodesis T7-T11 utilizing morcellized allograft, 2. Posterior thoracic segmental fixation T7-T11 utilizing nuvasive pedicle screws  . PROSTATECTOMY  06/2001   w/bilateral pelvic lymph nose dissection/notes 10/24/2010  . SPINAL FUSION  12/2014   Open reduction internal fixation of L1 Chance fracture with posterior fusion T10-L4 utilizing morcellized allograft and some local autograft, segmental instrumentation T10-L4 inclusive utilizing nuvasive pedicle screws/notes 12/16/2014  . Stress Cardiolite  02/17/2003  . THOROCOTOMY WITH LOBECTOMY  03/16/2015   Procedure: THOROCOTOMY WITH LOBECTOMY;  Surgeon: Ivin Poot, MD;  Location: [facility];  Service: Thoracic;;  . TONSILLECTOMY    . TOTAL THYROIDECTOMY  1997  . Venous Doppler  05/30/2004  . VENTRAL HERNIA REPAIR  04/14/2008  . VIDEO ASSISTED THORACOSCOPY Left 03/16/2015   Procedure: VIDEO ASSISTED THORACOSCOPY;  Surgeon: Ivin Poot, MD;  Location: [facility];  Service: Thoracic;  Laterality: Left;    Family History  Problem Relation Age of Onset  . CAD Mother   . Cancer Neg Hx        No cancer in the patient's immediate family, except of course for the patient himself, as noted    Social History:  reports that he quit smoking about 30 years ago. His smoking use included cigarettes. He has a 60.00 pack-year smoking history. He has never used smokeless tobacco. He reports previous alcohol use. He reports that he does not use drugs.  Allergies: No Known Allergies  Medications: I have reviewed the patient's current medications.  Results for orders placed or performed during the hospital encounter of 09/10/20 (from the past 48 hour(s))  Glucose, capillary     Status: Abnormal   Collection Time: 09/30/20 11:22 PM  Result Value Ref Range   Glucose-Capillary 171 (H) 70 - 99  mg/dL    Comment: Glucose reference range applies only to samples taken after fasting for at least 8 hours.  Vitamin B12     Status: None   Collection Time: 10/01/20  3:46 AM  Result Value Ref Range   Vitamin B-12 397 180 - 914 pg/mL    Comment: (NOTE) This assay is not validated for testing neonatal or myeloproliferative syndrome specimens for Vitamin B12 levels. Performed at [facility], [place] [address], [place], [place] [address]   Folate     Status: None   Collection Time: 10/01/20  3:46 AM  Result Value Ref Range   Folate 11.6 >5.9 ng/mL    Comment: Performed at [facility] [address], [place], [place] [address]  Iron and TIBC     Status: Abnormal   Collection Time: 10/01/20  3:46 AM  Result Value Ref Range   Iron 26 (L) 45 - 182 ug/dL   TIBC 349 250 - 450 ug/dL   Saturation Ratios 7 (L) 17.9 - 39.5 %   UIBC 323 ug/dL    Comment: Performed at [facility], [place] [address], [place], [place] [address]  Ferritin     Status: None   Collection Time: 10/01/20  3:46 AM  Result Value Ref Range   Ferritin 119 24 - 336 ng/mL    Comment: Performed at [facility], [place] [address], [place], [place] [address]  Reticulocytes     Status: Abnormal   Collection Time: 10/01/20  3:46 AM  Result Value Ref Range   Retic Ct Pct 2.5 0.4 - 3.1 %   RBC. 2.82 (L) 4.22 - 5.81 MIL/uL   Retic Count, Absolute 69.1 19.0 - 186.0 K/uL   Immature Retic Fract 23.3 (H) 2.3 - 15.9 %    Comment: Performed at [facility] [address], [place], [place] [address]  CBC with Differential/Platelet     Status: Abnormal   Collection Time: 10/01/20  3:46 AM  Result Value Ref Range   WBC 12.3 (H) 4.0 - 10.5 K/uL   RBC 2.79 (L) 4.22 - 5.81 MIL/uL   Hemoglobin 8.4 (L) 13.0 - 17.0 g/dL   HCT 26.6 (L) 39.0 - 52.0 %   MCV 95.3 80.0 - 100.0 fL   MCH 30.1 26.0 - 34.0 pg   MCHC 31.6 30.0 - 36.0 g/dL   RDW 13.5 11.5 - 15.5 %   Platelets 320 150 - 400 K/uL   nRBC 0.0 0.0 - 0.2 %    Neutrophils Relative % 80 %   Neutro Abs 9.9 (H) 1.7 - 7.7 K/uL   Lymphocytes Relative 9 %   Lymphs Abs 1.1 0.7 - 4.0 K/uL   Monocytes Relative 9 %   Monocytes Absolute 1.1 (H) 0.1 - 1.0 K/uL   Eosinophils Relative 1 %   Eosinophils Absolute 0.1 0.0 - 0.5 K/uL   Basophils Relative 0 %   Basophils Absolute 0.1 0.0 - 0.1 K/uL   Immature Granulocytes 1 %   Abs Immature Granulocytes 0.06 0.00 - 0.07 K/uL    Comment: Performed at [facility], [place] [address] [place], [place] [address]  Basic metabolic panel     Status: Abnormal   Collection Time: 10/01/20  3:46 AM  Result Value Ref Range   Sodium 133 (L) 135 - 145 mmol/L   Potassium 5.2 (H) 3.5 - 5.1 mmol/L   Chloride 94 (L) 98 - 111 mmol/L   CO2 30 22 - 32 mmol/L   Glucose, Bld 206 (H) 70 - 99 mg/dL    Comment: Glucose reference range applies only to samples taken after fasting for at least 8 hours.   BUN 50 (H) 8 - 23 mg/dL   Creatinine, Ser 1.15 0.61 - 1.24 mg/dL   Calcium 8.5 (L) 8.9 - 10.3 mg/dL   GFR, Estimated >60 >60 mL/min    Comment: (NOTE) Calculated using the CKD-EPI Creatinine Equation (2021)    Anion gap 9 5 - 15    Comment: Performed at [facility] [address] [place],  [address]  Glucose, capillary     Status: Abnormal   Collection Time: 10/01/20  6:07 AM  Result Value Ref Range   Glucose-Capillary 185 (H) 70 - 99 mg/dL    Comment: Glucose reference range applies only to samples taken after fasting for at least 8 hours.  Glucose, capillary     Status: Abnormal   Collection Time: 10/01/20  9:07 AM  Result Value Ref Range   Glucose-Capillary 180 (H) 70 - 99 mg/dL    Comment: Glucose reference range applies only to samples taken after fasting for at least 8  hours.  Glucose, capillary     Status: Abnormal   Collection Time: 10/01/20 12:22 PM  Result Value Ref Range   Glucose-Capillary 151 (H) 70 - 99 mg/dL    Comment: Glucose reference range applies only to samples taken after fasting for  at least 8 hours.  Glucose, capillary     Status: Abnormal   Collection Time: 10/01/20  4:33 PM  Result Value Ref Range   Glucose-Capillary 175 (H) 70 - 99 mg/dL    Comment: Glucose reference range applies only to samples taken after fasting for at least 8 hours.  Glucose, capillary     Status: Abnormal   Collection Time: 10/01/20  8:12 PM  Result Value Ref Range   Glucose-Capillary 174 (H) 70 - 99 mg/dL    Comment: Glucose reference range applies only to samples taken after fasting for at least 8 hours.  Glucose, capillary     Status: Abnormal   Collection Time: 10/02/20 12:29 AM  Result Value Ref Range   Glucose-Capillary 210 (H) 70 - 99 mg/dL    Comment: Glucose reference range applies only to samples taken after fasting for at least 8 hours.  Glucose, capillary     Status: Abnormal   Collection Time: 10/02/20  4:28 AM  Result Value Ref Range   Glucose-Capillary 165 (H) 70 - 99 mg/dL    Comment: Glucose reference range applies only to samples taken after fasting for at least 8 hours.  Glucose, capillary     Status: Abnormal   Collection Time: 10/02/20  7:29 AM  Result Value Ref Range   Glucose-Capillary 184 (H) 70 - 99 mg/dL    Comment: Glucose reference range applies only to samples taken after fasting for at least 8 hours.  CBC with Differential/Platelet     Status: Abnormal   Collection Time: 10/02/20  7:48 AM  Result Value Ref Range   WBC 12.3 (H) 4.0 - 10.5 K/uL   RBC 2.64 (L) 4.22 - 5.81 MIL/uL   Hemoglobin 7.7 (L) 13.0 - 17.0 g/dL   HCT 24.9 (L) 39.0 - 52.0 %   MCV 94.3 80.0 - 100.0 fL   MCH 29.2 26.0 - 34.0 pg   MCHC 30.9 30.0 - 36.0 g/dL   RDW 13.4 11.5 - 15.5 %   Platelets 378 150 - 400 K/uL   nRBC 0.0 0.0 - 0.2 %   Neutrophils Relative % 81 %   Neutro Abs 10.0 (H) 1.7 - 7.7 K/uL   Lymphocytes Relative 8 %   Lymphs Abs 1.0 0.7 - 4.0 K/uL   Monocytes Relative 9 %   Monocytes Absolute 1.1 (H) 0.1 - 1.0 K/uL   Eosinophils Relative 1 %   Eosinophils Absolute 0.1  0.0 - 0.5 K/uL   Basophils Relative 0 %   Basophils Absolute 0.1 0.0 - 0.1 K/uL   Immature Granulocytes 1 %   Abs Immature Granulocytes 0.07 0.00 - 0.07 K/uL    Comment: Performed at [facility], [address] [place], [place] [address]  Basic metabolic panel     Status: Abnormal   Collection Time: 10/02/20  7:48 AM  Result Value Ref Range   Sodium 130 (L) 135 - 145 mmol/L   Potassium 5.5 (H) 3.5 - 5.1 mmol/L   Chloride 92 (L) 98 - 111 mmol/L   CO2 33 (H) 22 - 32 mmol/L   Glucose, Bld 188 (H) 70 - 99 mg/dL    Comment: Glucose reference range applies only to samples taken  after fasting for at least 8 hours.   BUN 48 (H) 8 - 23 mg/dL   Creatinine, Ser 1.13 0.61 - 1.24 mg/dL   Calcium 8.6 (L) 8.9 - 10.3 mg/dL   GFR, Estimated >60 >60 mL/min    Comment: (NOTE) Calculated using the CKD-EPI Creatinine Equation (2021)    Anion gap 5 5 - 15    Comment: Performed at [facility] [address], [place], [place] [address]  Glucose, capillary     Status: Abnormal   Collection Time: 10/02/20 11:26 AM  Result Value Ref Range   Glucose-Capillary 115 (H) 70 - 99 mg/dL    Comment: Glucose reference range applies only to samples taken after fasting for at least 8 hours.  Glucose, capillary     Status: Abnormal   Collection Time: 10/02/20  4:21 PM  Result Value Ref Range   Glucose-Capillary 137 (H) 70 - 99 mg/dL    Comment: Glucose reference range applies only to samples taken after fasting for at least 8 hours.    CT SOFT TISSUE NECK W CONTRAST  Result Date: 09/30/2020 CLINICAL DATA:  Abnormal barium swallow EXAM: CT NECK WITH CONTRAST TECHNIQUE: Multidetector CT imaging of the neck was performed using the standard protocol following the bolus administration of intravenous contrast. CONTRAST:  175m OMNIPAQUE IOHEXOL 300 MG/ML  SOLN COMPARISON:  None. FINDINGS: Pharynx and larynx: There is a prevertebral soft tissue mass at the C4-5 level, which is the level of the supraglottic  larynx. This is to the right of the esophageal catheter. The abnormality measures approximately 2.6 x 1.0 x 2.8 cm. The supraglottic airway is narrowed. There is nodularity of the vocal folds. Salivary glands: No inflammation, mass, or stone. Thyroid: Surgically absent Lymph nodes: None enlarged or abnormal density. Vascular: Negative. Limited intracranial: Negative. Visualized orbits: Negative. Mastoids and visualized paranasal sinuses: Clear. Skeleton: No acute or aggressive process. Upper chest: Multifocal opacity in the right middle lobe, incompletely visualized. Tracheostomy tube in expected position. Other: None. IMPRESSION: 1. Prevertebral soft tissue mass at the C4-5 level, the level of the supraglottic larynx, measuring approximately 2.6 x 1.0 x 2.8 cm. Additionally, there is nodularity of the vocal folds. 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 Endoscopic visualization recommended. 2. Multifocal opacity in the right middle lobe, incompletely visualized. This may indicate aspiration or pneumonia. Electronically Signed   By: KUlyses JarredM.D.   On: 09/30/2020 21:15    ROS:ROS  Blood pressure (!) 122/53, pulse 75, temperature 98.1 F (36.7 C), temperature source Oral, resp. rate (!) 35, height 5' 9.02" (1.753 m), weight 82.3 kg, SpO2 95 %.  PHYSICAL EXAM: CONSTITUTIONAL: well developed, nourished, no distress and alert and oriented x 3 CARDIOVASCULAR: normal rate and regular rhythm PULMONARY/CHEST WALL: On trach collar HENT: Head : normocephalic and atraumatic Ears: Right ear:   canal normal, external ear normal and hearing normal Left ear:   canal normal, external ear normal and hearing normal Nose: Dobbhoff feeding tube in left nare. Nose normal and no purulence Mouth/Throat:  Mouth: uvula midline and no oral lesions Throat: Oropharynx clear and moist Mucous membranes: normal EYES: conjunctiva normal, EOM normal and PERRL NECK: Cuffed tracheostomy in place with  cuff deflated, patent, crusting noted of inner cannula, no active bleeding around trach.  Secured with Posey trach tie.  Procedure: Transnasal fiberoptic laryngoscopy Anesth: Topical with 2% lidocaine + Afrin Compl: None Findings: Fullness of the right side of posterior pharyngeal wall and post cricoid area noted,  with no exophytic mass lesion or mucosal changes appreciated.  No obstruction of the glottis appreciated.  Description:  After discussing risks, benefits, and alternatives, the patient was placed in a seated position and the right nasal passage was sprayed with topical anesthetic.  The fiberoptic scope was passed through the right nasal passage to view the pharynx and larynx.  Findings are noted above.  The scope was then removed and he was returned to nursing care in stable condition.  Studies Reviewed:CT Neck reviewed  Assessment/Plan: Devin [name] is an 80 y.o. male with extensive past medical history, s/p tracheostomy for respiratory failure on 09/19/2020 with incidental finding of pre-vertebral soft tissue edema on modified barium swallow. CT neck on 09/30/2020 demonstrates prevertebral soft tissue mass at the level of the supraglottic larynx, measuring approximately 2.6 x 1.0 x 2.8 cm   Complete head neck examination today including flexible nasolaryngoscopy demonstrates mass-effect of the right hypopharynx, to include the posterior pharyngeal wall and post cricoid area.  There is no obvious mucosal abnormality overlying this area, no obstruction of the glottis noted.  Findings are concerning for malignant neoplasm. -Recommend proceeding with panendoscopy with biopsy for definitive diagnosis, exact time and date TBD.  Surgical procedure, as well as risks benefits and alternatives were reviewed with patient and his family, who were agreeable. -Last dose of Eliquis given 04/24, please continue to hold for upcoming surgery -When surgical time and date is confirmed, will need to hold  feeding tubes at midnight prior to day of surgery -I will plan on switching patient's trach to an uncuffed trach tube following surgical procedure if patient is breathing spontaneously   Thank you for allowing me to participate in the care of this patient. Please do not hesitate to contact me with any questions or concerns.   [name], [facility] Cell: [phone]   10/02/2020, 5:16 PM

## 2020-10-02 NOTE — H&P (View-Only) (Signed)
ENT CONSULT:  Reason for Consult: Mass lesion on CT   Referring Physician:  Dr. Pietro Cassis  HPI: Devin [name] is an 80 y.o. male with past medical history of atrial fibrillation, anticoagulated on Eliquis, Crohn's disease, lung cancer status post RSXN, thyroid cancer status post total thyroidectomy and RAI, prostate cancer, type 2 diabetes mellitus, COPD, ankylosing spondylitis with chronic low back pain and gait disorder who was admitted following a fall on 09/07/2020.  He was admitted on 09/10/2020 with ongoing pain, shortness of breath and agitation.  He became unresponsive in the ED requiring intubation for acute on chronic respiratory failure.  A CT chest was obtained which demonstrated acute T11 vertebral fracture.  He underwent posterior thoracic fusion T11-T12 on 09/15/2020 and was unable to tolerate extubation x2.  He ultimately underwent tracheostomy performed by critical care on 09/19/2020.  He has been off vent since 09/25/2020.  Patient underwent modified barium swallow on 09/29/2020 which revealed pharyngeal dysphagia related to thickening of the prevertebral tissue at the level of C4-5.  He subsequently had a CT neck on 09/30/2020, which reported prevertebral soft tissue mass at the level of the supraglottic larynx, measuring approximately 2.6 x 1.0 x 2.8 cm concerning for malignant neoplasm.  ENT consulted to evaluate.  History obtained from chart review, patient and patient's daughter and wife at bedside.  Patient is a former smoker but quit over 20 years ago.  Patient's wife and daughter states that he was having some mild odynophagia prior to presentation at the hospital.  They deny any dysphonia.  They deny unintentional weight loss, nausea, vomiting.  Patient has been stable on trach collar for several days.  He has intermittently tolerated use of Passy-Muir valve, but endorses shortness of breath with finger occlusion at bedside today  Past Medical History:  Diagnosis Date  . A-fib ([place])    . Adenocarcinoma of left lung, stage 1 ([place]) 03/10/2015  . Ankylosing spondylitis ([place])    Diagnosed during lumbar fracture summer of 2016    . Crohn's disease ([place])   . Gout   . HIstory of basal cell cancer of face    THYROID CA HX  . History of kidney stones   . Hypertension   . Hypothyroidism   . Impotence   . 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 Obesity (BMI 30-39.9)   . Osteoporosis    Pt completed 5 years of fosamax in 2013     . Prostate cancer with recurrence    Treated with prostatectomy with recurrence 2012 with Lupron treatment now him   . Psoriasis   . Thyroid cancer ([place]) 1999   Treated with RAI and total thyroidectomy   . Type II diabetes mellitus ([place])     Past Surgical History:  Procedure Laterality Date  . ABDOMINAL EXPLORATION SURGERY     for small bowel obstruction  . APPENDECTOMY  03/2007  . BACK SURGERY    . BASAL CELL CARCINOMA EXCISION  "several"   "head"  . CARDIAC CATHETERIZATION  03/17/2003  . CHOLECYSTECTOMY N/A 05/07/2016   Procedure: LAPAROSCOPIC CHOLECYSTECTOMY;  Surgeon: Fanny Skates, MD;  Location: [facility];  Service: General;  Laterality: N/A;  . COLON SURGERY  03/2007   Resection of cecum, appendix, terminal ileum (approximately/notes 10/10/2010  . CYSTOSCOPY/URETEROSCOPY/HOLMIUM LASER/STENT PLACEMENT Right 07/03/2020   Procedure: CYSTOSCOPY/RETROGRADE/URETEROSCOPY REMOVAL OF BLADDER STONE;  Surgeon: Raynelle Bring, MD;  Location: [facility];  Service: Urology;  Laterality: Right;  . HERNIA REPAIR    .  LAMINECTOMY WITH POSTERIOR LATERAL ARTHRODESIS LEVEL 1 N/A 09/15/2020   Procedure: REVISION OF THORACOLUMBAR FUSION, ADDITION OF CROSS-CONNECTORS;  Surgeon: Eustace Moore, MD;  Location: [facility];  Service: Neurosurgery;  Laterality: N/A;  . LAPAROSCOPIC CHOLECYSTECTOMY  05/07/2016  . LAPAROSCOPIC LYSIS OF ADHESIONS  05/07/2016  . LAPAROSCOPIC LYSIS OF ADHESIONS N/A 05/07/2016   Procedure: LAPAROSCOPIC LYSIS OF ADHESIONS  TIMES ONE HOUR;  Surgeon: Fanny Skates, MD;  Location: [facility];  Service: General;  Laterality: N/A;  . POSTERIOR FUSION THORACIC SPINE  02/08/2016   1. Posterior thoracic arthrodesis T7-T11 utilizing morcellized allograft, 2. Posterior thoracic segmental fixation T7-T11 utilizing nuvasive pedicle screws  . PROSTATECTOMY  06/2001   w/bilateral pelvic lymph nose dissection/notes 10/24/2010  . SPINAL FUSION  12/2014   Open reduction internal fixation of L1 Chance fracture with posterior fusion T10-L4 utilizing morcellized allograft and some local autograft, segmental instrumentation T10-L4 inclusive utilizing nuvasive pedicle screws/notes 12/16/2014  . Stress Cardiolite  02/17/2003  . THOROCOTOMY WITH LOBECTOMY  03/16/2015   Procedure: THOROCOTOMY WITH LOBECTOMY;  Surgeon: Ivin Poot, MD;  Location: [facility];  Service: Thoracic;;  . TONSILLECTOMY    . TOTAL THYROIDECTOMY  1997  . Venous Doppler  05/30/2004  . VENTRAL HERNIA REPAIR  04/14/2008  . VIDEO ASSISTED THORACOSCOPY Left 03/16/2015   Procedure: VIDEO ASSISTED THORACOSCOPY;  Surgeon: Ivin Poot, MD;  Location: [facility];  Service: Thoracic;  Laterality: Left;    Family History  Problem Relation Age of Onset  . CAD Mother   . Cancer Neg Hx        No cancer in the patient's immediate family, except of course for the patient himself, as noted    Social History:  reports that he quit smoking about 30 years ago. His smoking use included cigarettes. He has a 60.00 pack-year smoking history. He has never used smokeless tobacco. He reports previous alcohol use. He reports that he does not use drugs.  Allergies: No Known Allergies  Medications: I have reviewed the patient's current medications.  Results for orders placed or performed during the hospital encounter of 09/10/20 (from the past 48 hour(s))  Glucose, capillary     Status: Abnormal   Collection Time: 09/30/20 11:22 PM  Result Value Ref Range   Glucose-Capillary 171 (H) 70 - 99  mg/dL    Comment: Glucose reference range applies only to samples taken after fasting for at least 8 hours.  Vitamin B12     Status: None   Collection Time: 10/01/20  3:46 AM  Result Value Ref Range   Vitamin B-12 397 180 - 914 pg/mL    Comment: (NOTE) This assay is not validated for testing neonatal or myeloproliferative syndrome specimens for Vitamin B12 levels. Performed at [facility], [place] [address] [place], [place] [address]   Folate     Status: None   Collection Time: 10/01/20  3:46 AM  Result Value Ref Range   Folate 11.6 >5.9 ng/mL    Comment: Performed at [facility] [address], [place], [place] [address]  Iron and TIBC     Status: Abnormal   Collection Time: 10/01/20  3:46 AM  Result Value Ref Range   Iron 26 (L) 45 - 182 ug/dL   TIBC 349 250 - 450 ug/dL   Saturation Ratios 7 (L) 17.9 - 39.5 %   UIBC 323 ug/dL    Comment: Performed at [facility], [place] [address], [place], [place] [address]  Ferritin     Status: None   Collection Time: 10/01/20  3:46 AM  Result Value Ref Range   Ferritin 119 24 - 336 ng/mL    Comment: Performed at [facility], [place] [address] [place], [place] [address]  Reticulocytes     Status: Abnormal   Collection Time: 10/01/20  3:46 AM  Result Value Ref Range   Retic Ct Pct 2.5 0.4 - 3.1 %   RBC. 2.82 (L) 4.22 - 5.81 MIL/uL   Retic Count, Absolute 69.1 19.0 - 186.0 K/uL   Immature Retic Fract 23.3 (H) 2.3 - 15.9 %    Comment: Performed at [facility] [address], [place], [place] [address]  CBC with Differential/Platelet     Status: Abnormal   Collection Time: 10/01/20  3:46 AM  Result Value Ref Range   WBC 12.3 (H) 4.0 - 10.5 K/uL   RBC 2.79 (L) 4.22 - 5.81 MIL/uL   Hemoglobin 8.4 (L) 13.0 - 17.0 g/dL   HCT 26.6 (L) 39.0 - 52.0 %   MCV 95.3 80.0 - 100.0 fL   MCH 30.1 26.0 - 34.0 pg   MCHC 31.6 30.0 - 36.0 g/dL   RDW 13.5 11.5 - 15.5 %   Platelets 320 150 - 400 K/uL   nRBC 0.0 0.0 - 0.2 %    Neutrophils Relative % 80 %   Neutro Abs 9.9 (H) 1.7 - 7.7 K/uL   Lymphocytes Relative 9 %   Lymphs Abs 1.1 0.7 - 4.0 K/uL   Monocytes Relative 9 %   Monocytes Absolute 1.1 (H) 0.1 - 1.0 K/uL   Eosinophils Relative 1 %   Eosinophils Absolute 0.1 0.0 - 0.5 K/uL   Basophils Relative 0 %   Basophils Absolute 0.1 0.0 - 0.1 K/uL   Immature Granulocytes 1 %   Abs Immature Granulocytes 0.06 0.00 - 0.07 K/uL    Comment: Performed at [facility], [place] [address], [place], [place] [address]  Basic metabolic panel     Status: Abnormal   Collection Time: 10/01/20  3:46 AM  Result Value Ref Range   Sodium 133 (L) 135 - 145 mmol/L   Potassium 5.2 (H) 3.5 - 5.1 mmol/L   Chloride 94 (L) 98 - 111 mmol/L   CO2 30 22 - 32 mmol/L   Glucose, Bld 206 (H) 70 - 99 mg/dL    Comment: Glucose reference range applies only to samples taken after fasting for at least 8 hours.   BUN 50 (H) 8 - 23 mg/dL   Creatinine, Ser 1.15 0.61 - 1.24 mg/dL   Calcium 8.5 (L) 8.9 - 10.3 mg/dL   GFR, Estimated >60 >60 mL/min    Comment: (NOTE) Calculated using the CKD-EPI Creatinine Equation (2021)    Anion gap 9 5 - 15    Comment: Performed at [facility] [address], [place],  [address]  Glucose, capillary     Status: Abnormal   Collection Time: 10/01/20  6:07 AM  Result Value Ref Range   Glucose-Capillary 185 (H) 70 - 99 mg/dL    Comment: Glucose reference range applies only to samples taken after fasting for at least 8 hours.  Glucose, capillary     Status: Abnormal   Collection Time: 10/01/20  9:07 AM  Result Value Ref Range   Glucose-Capillary 180 (H) 70 - 99 mg/dL    Comment: Glucose reference range applies only to samples taken after fasting for at least 8  hours.  Glucose, capillary     Status: Abnormal   Collection Time: 10/01/20 12:22 PM  Result Value Ref Range   Glucose-Capillary 151 (H) 70 - 99 mg/dL    Comment: Glucose reference range applies only to samples taken after fasting for  at least 8 hours.  Glucose, capillary     Status: Abnormal   Collection Time: 10/01/20  4:33 PM  Result Value Ref Range   Glucose-Capillary 175 (H) 70 - 99 mg/dL    Comment: Glucose reference range applies only to samples taken after fasting for at least 8 hours.  Glucose, capillary     Status: Abnormal   Collection Time: 10/01/20  8:12 PM  Result Value Ref Range   Glucose-Capillary 174 (H) 70 - 99 mg/dL    Comment: Glucose reference range applies only to samples taken after fasting for at least 8 hours.  Glucose, capillary     Status: Abnormal   Collection Time: 10/02/20 12:29 AM  Result Value Ref Range   Glucose-Capillary 210 (H) 70 - 99 mg/dL    Comment: Glucose reference range applies only to samples taken after fasting for at least 8 hours.  Glucose, capillary     Status: Abnormal   Collection Time: 10/02/20  4:28 AM  Result Value Ref Range   Glucose-Capillary 165 (H) 70 - 99 mg/dL    Comment: Glucose reference range applies only to samples taken after fasting for at least 8 hours.  Glucose, capillary     Status: Abnormal   Collection Time: 10/02/20  7:29 AM  Result Value Ref Range   Glucose-Capillary 184 (H) 70 - 99 mg/dL    Comment: Glucose reference range applies only to samples taken after fasting for at least 8 hours.  CBC with Differential/Platelet     Status: Abnormal   Collection Time: 10/02/20  7:48 AM  Result Value Ref Range   WBC 12.3 (H) 4.0 - 10.5 K/uL   RBC 2.64 (L) 4.22 - 5.81 MIL/uL   Hemoglobin 7.7 (L) 13.0 - 17.0 g/dL   HCT 24.9 (L) 39.0 - 52.0 %   MCV 94.3 80.0 - 100.0 fL   MCH 29.2 26.0 - 34.0 pg   MCHC 30.9 30.0 - 36.0 g/dL   RDW 13.4 11.5 - 15.5 %   Platelets 378 150 - 400 K/uL   nRBC 0.0 0.0 - 0.2 %   Neutrophils Relative % 81 %   Neutro Abs 10.0 (H) 1.7 - 7.7 K/uL   Lymphocytes Relative 8 %   Lymphs Abs 1.0 0.7 - 4.0 K/uL   Monocytes Relative 9 %   Monocytes Absolute 1.1 (H) 0.1 - 1.0 K/uL   Eosinophils Relative 1 %   Eosinophils Absolute 0.1  0.0 - 0.5 K/uL   Basophils Relative 0 %   Basophils Absolute 0.1 0.0 - 0.1 K/uL   Immature Granulocytes 1 %   Abs Immature Granulocytes 0.07 0.00 - 0.07 K/uL    Comment: Performed at [facility], [address] [place], [place] [address]  Basic metabolic panel     Status: Abnormal   Collection Time: 10/02/20  7:48 AM  Result Value Ref Range   Sodium 130 (L) 135 - 145 mmol/L   Potassium 5.5 (H) 3.5 - 5.1 mmol/L   Chloride 92 (L) 98 - 111 mmol/L   CO2 33 (H) 22 - 32 mmol/L   Glucose, Bld 188 (H) 70 - 99 mg/dL    Comment: Glucose reference range applies only to samples taken  after fasting for at least 8 hours.   BUN 48 (H) 8 - 23 mg/dL   Creatinine, Ser 1.13 0.61 - 1.24 mg/dL   Calcium 8.6 (L) 8.9 - 10.3 mg/dL   GFR, Estimated >60 >60 mL/min    Comment: (NOTE) Calculated using the CKD-EPI Creatinine Equation (2021)    Anion gap 5 5 - 15    Comment: Performed at [facility] [address], [place], [place] [address]  Glucose, capillary     Status: Abnormal   Collection Time: 10/02/20 11:26 AM  Result Value Ref Range   Glucose-Capillary 115 (H) 70 - 99 mg/dL    Comment: Glucose reference range applies only to samples taken after fasting for at least 8 hours.  Glucose, capillary     Status: Abnormal   Collection Time: 10/02/20  4:21 PM  Result Value Ref Range   Glucose-Capillary 137 (H) 70 - 99 mg/dL    Comment: Glucose reference range applies only to samples taken after fasting for at least 8 hours.    CT SOFT TISSUE NECK W CONTRAST  Result Date: 09/30/2020 CLINICAL DATA:  Abnormal barium swallow EXAM: CT NECK WITH CONTRAST TECHNIQUE: Multidetector CT imaging of the neck was performed using the standard protocol following the bolus administration of intravenous contrast. CONTRAST:  174m OMNIPAQUE IOHEXOL 300 MG/ML  SOLN COMPARISON:  None. FINDINGS: Pharynx and larynx: There is a prevertebral soft tissue mass at the C4-5 level, which is the level of the supraglottic  larynx. This is to the right of the esophageal catheter. The abnormality measures approximately 2.6 x 1.0 x 2.8 cm. The supraglottic airway is narrowed. There is nodularity of the vocal folds. Salivary glands: No inflammation, mass, or stone. Thyroid: Surgically absent Lymph nodes: None enlarged or abnormal density. Vascular: Negative. Limited intracranial: Negative. Visualized orbits: Negative. Mastoids and visualized paranasal sinuses: Clear. Skeleton: No acute or aggressive process. Upper chest: Multifocal opacity in the right middle lobe, incompletely visualized. Tracheostomy tube in expected position. Other: None. IMPRESSION: 1. Prevertebral soft tissue mass at the C4-5 level, the level of the supraglottic larynx, measuring approximately 2.6 x 1.0 x 2.8 cm. Additionally, there is nodularity of the vocal folds. 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 Endoscopic visualization recommended. 2. Multifocal opacity in the right middle lobe, incompletely visualized. This may indicate aspiration or pneumonia. Electronically Signed   By: KUlyses JarredM.D.   On: 09/30/2020 21:15    ROS:ROS  Blood pressure (!) 122/53, pulse 75, temperature 98.1 F (36.7 C), temperature source Oral, resp. rate (!) 35, height 5' 9.02" (1.753 m), weight 82.3 kg, SpO2 95 %.  PHYSICAL EXAM: CONSTITUTIONAL: well developed, nourished, no distress and alert and oriented x 3 CARDIOVASCULAR: normal rate and regular rhythm PULMONARY/CHEST WALL: On trach collar HENT: Head : normocephalic and atraumatic Ears: Right ear:   canal normal, external ear normal and hearing normal Left ear:   canal normal, external ear normal and hearing normal Nose: Dobbhoff feeding tube in left nare. Nose normal and no purulence Mouth/Throat:  Mouth: uvula midline and no oral lesions Throat: Oropharynx clear and moist Mucous membranes: normal EYES: conjunctiva normal, EOM normal and PERRL NECK: Cuffed tracheostomy in place with  cuff deflated, patent, crusting noted of inner cannula, no active bleeding around trach.  Secured with Posey trach tie.  Procedure: Transnasal fiberoptic laryngoscopy Anesth: Topical with 2% lidocaine + Afrin Compl: None Findings: Fullness of the right side of posterior pharyngeal wall and post cricoid area noted,  with no exophytic mass lesion or mucosal changes appreciated.  No obstruction of the glottis appreciated.  Description:  After discussing risks, benefits, and alternatives, the patient was placed in a seated position and the right nasal passage was sprayed with topical anesthetic.  The fiberoptic scope was passed through the right nasal passage to view the pharynx and larynx.  Findings are noted above.  The scope was then removed and he was returned to nursing care in stable condition.  Studies Reviewed:CT Neck reviewed  Assessment/Plan: Devin [name] is an 80 y.o. male with extensive past medical history, s/p tracheostomy for respiratory failure on 09/19/2020 with incidental finding of pre-vertebral soft tissue edema on modified barium swallow. CT neck on 09/30/2020 demonstrates prevertebral soft tissue mass at the level of the supraglottic larynx, measuring approximately 2.6 x 1.0 x 2.8 cm   Complete head neck examination today including flexible nasolaryngoscopy demonstrates mass-effect of the right hypopharynx, to include the posterior pharyngeal wall and post cricoid area.  There is no obvious mucosal abnormality overlying this area, no obstruction of the glottis noted.  Findings are concerning for malignant neoplasm. -Recommend proceeding with panendoscopy with biopsy for definitive diagnosis, exact time and date TBD.  Surgical procedure, as well as risks benefits and alternatives were reviewed with patient and his family, who were agreeable. -Last dose of Eliquis given 04/24, please continue to hold for upcoming surgery -When surgical time and date is confirmed, will need to hold  feeding tubes at midnight prior to day of surgery -I will plan on switching patient's trach to an uncuffed trach tube following surgical procedure if patient is breathing spontaneously   Thank you for allowing me to participate in the care of this patient. Please do not hesitate to contact me with any questions or concerns.   [name], [facility] Cell: [phone]   10/02/2020, 5:16 PM

## 2020-10-02 NOTE — Progress Notes (Signed)
OT Cancellation Note  Patient Details Name: NASIER THUMM MRN: [id number] DOB: [birth year]   Cancelled Treatment:    Reason Eval/Treat Not Completed: (P) Other (comment) Pt complaining of being fatigued and requesting OT return later this pm. Will follow up as schedule allows.   Cambren Helm,[facility] 10/02/2020, 3:07 PM Maurie Boettcher, OT/L   Acute OT Clinical Specialist Acute Rehabilitation Services Pager [phone] Office [phone]

## 2020-10-02 NOTE — Progress Notes (Signed)
PT Cancellation Note  Patient Details Name: Devin [name] MRN: [id number] DOB: 05-Jul-1940   Cancelled Treatment:    Reason Eval/Treat Not Completed: Fatigue/lethargy limiting ability to participate this afternoon. Pt stating he was unable to get any sleep last night and asked therapies to return at later time. PT will continue to follow and attempt to progress with established POC as time/schedule allows.   Hardie Pulley, DPT   Acute Rehabilitation Department Pager #: [phone]   Otho Bellows 10/02/2020, 12:29 PM

## 2020-10-02 NOTE — Progress Notes (Signed)
Physical Therapy Treatment Patient Details Name: Devin [name] MRN: [id number] DOB: 09/16/1940 Today's Date: 10/02/2020    History of Present Illness 80 yo male who presented 4/3 s/p fell at a bookstore 3/31 and obtained T11 chance fracture, stable L1 compression fracture, extensive lumbar spondylosis and postsurgical changes. Additionally had left 7th rib fx anteriorly and left pleural effusion. Pt intubated in ER on 4/3 for agitation and hypoxemia. 4/8 s/p rod connection for T11 fx. 4/9 failed attempt extubation.  4/12 trach placement intubated 4/3.  PMH restrictive lung disease, emphysema/COPD adenocarcinoma s/p LUL lobectomy Crohn's, Psoriasis, Afib, nephrolithiasis, HTN, DM, obesity, thyroid cancer s/p RAI and total thyroidectomy (1999), and prostate cancer s/p prostatectomy (2012) but with recurrence    PT Comments    The pt was eventually agreeable to limited session due to reports of significant fatigue and pain today, and bed-level exercises were initiated. However, upon movement of BLE, the pt was noted to be sitting in BM (reports he was unaware) and required assist of 2 to complete rolling and cleaning. The pt was able to demo good initiation of movement and ROM with exercises, and used BLE strength and reaching with UE to complete rolling in bed with minA only. The pt did experience increased secretions with supine in bed, more comfortable with HOB elevated at this time. The pt will continue to benefit from skilled PT to progress functional strength, stability, and activity tolerance. Continue to recommend CIR at d/c.     Follow Up Recommendations  CIR     Equipment Recommendations  3in1 (PT);Rolling walker with 5" wheels    Recommendations for Other Services       Precautions / Restrictions Precautions Precautions: Fall;Other (comment);Back Precaution Booklet Issued: No Precaution Comments: rectal tube, trach collar, NG tube Required Braces or Orthoses: Spinal  Brace Spinal Brace: Thoracolumbosacral orthotic;Applied in sitting position Restrictions Weight Bearing Restrictions: No    Mobility  Bed Mobility Overal bed mobility: Needs Assistance Bed Mobility: Rolling Rolling: Min assist         General bed mobility comments: minA with VC for placement of hands and movement of LE to complete log roll, pt able to maintain sidelying position with use of bed rails    Transfers                 General transfer comment: deferred this day due to pt pain        Balance Overall balance assessment: Needs assistance Sitting-balance support: Bilateral upper extremity supported;Feet supported;Single extremity supported Sitting balance-Leahy Scale: Fair Sitting balance - Comments: Pt with preference for UE support on bed.                                    Cognition Arousal/Alertness: Awake/alert Behavior During Therapy: WFL for tasks assessed/performed Overall Cognitive Status: Difficult to assess                                 General Comments: pt following simple commands consistently, but requires cues for sequencing/steps to complete mobility. Pt sitting in BM upon arrival of PT despite flexiseal, denies awareness      Exercises General Exercises - Lower Extremity Ankle Circles/Pumps: AROM;Both;10 reps;Seated Short Arc Quad: AROM;Both;10 reps;Seated Heel Slides: AROM;Both;10 reps;Supine    General Comments General comments (skin integrity, edema, etc.): flexiseal intact, pt with loose BM upon  arrival of PT.      Pertinent Vitals/Pain Pain Assessment: Faces Faces Pain Scale: Hurts whole lot Pain Location: back when laid flat, with log roll Pain Descriptors / Indicators: Discomfort;Grimacing;Guarding Pain Intervention(s): Limited activity within patient's tolerance;Monitored during session;Repositioned           PT Goals (current goals can now be found in the care plan section) Acute Rehab  PT Goals Patient Stated Goal: to walk PT Goal Formulation: With patient/family Time For Goal Achievement: 10/04/20 Potential to Achieve Goals: Good Progress towards PT goals: Progressing toward goals    Frequency    Min 5X/week      PT Plan Current plan remains appropriate       AM-PAC PT "6 Clicks" Mobility   Outcome Measure  Help needed turning from your back to your side while in a flat bed without using bedrails?: A Little Help needed moving from lying on your back to sitting on the side of a flat bed without using bedrails?: A Little Help needed moving to and from a bed to a chair (including a wheelchair)?: A Little Help needed standing up from a chair using your arms (e.g., wheelchair or bedside chair)?: A Little Help needed to walk in hospital room?: A Little Help needed climbing 3-5 steps with a railing? : A Lot 6 Click Score: 17    End of Session Equipment Utilized During Treatment: Oxygen;Back brace Activity Tolerance: Patient tolerated treatment well Patient left: with call bell/phone within reach;in bed;with bed alarm set Nurse Communication: Mobility status PT Visit Diagnosis: Pain;Difficulty in walking, not elsewhere classified (R26.2);Unsteadiness on feet (R26.81);Other abnormalities of gait and mobility (R26.89);Muscle weakness (generalized) (M62.81) Pain - Right/Left: Right Pain - part of body:  (back)     Time: 9518-8416 PT Time Calculation (min) (ACUTE ONLY): 25 min  Charges:  $Therapeutic Exercise: 8-22 mins $Therapeutic Activity: 8-22 mins                     Karma Ganja, PT, DPT   Acute Rehabilitation Department Pager #: [phone]   Otho Bellows 10/02/2020, 3:54 PM

## 2020-10-02 NOTE — Progress Notes (Addendum)
  Speech Language Pathology Treatment: Devin [name] Speaking valve;Dysphagia  Patient Details Name: Devin [name] MRN: [id number] DOB: Jun 28, 1940 Today's Date: 10/02/2020 Time: 0950-1020 SLP Time Calculation (min) (ACUTE ONLY): 30 min  Assessment / Plan / Recommendation Clinical Impression  F/u after 4/22 MBS.  Pt had subsequent CT neck 4/23, which confirmed prevertebral soft tissue mass at the C4-5 level, the level of the supraglottic larynx, measuring approximately 2.6 x 1.0 x 2.8 cm. ENT consult and biopsy are pending.  During today's session imaging of MBS was reviewed with pt and his daughter at bedside. Discussed why presence of tissue is interfering with swallowing and creating opportunities for aspiration.  Encouraged Devin [name] to try and focus on his physical rehab at this time while he waits for more information from biopsy and ENT.   PMV education was provided to his daughter.  She verbalized understanding of parameters under which valve should be placed and removed.  Valve tolerated with no changes in VS during our session.  Pt's voice remains hypophonic but intelligible.  He was provided with several ice chips, which elicit cough and expectoration of secretions orally. He suctions independently.  Recommend allowing occasional ice chips for oral comfort when valve is in place.   SLP will continue to follow for PMV, plan of care.    HPI HPI: 80 yo male admitted s/p fall 3/31 and obtained T11 chance fracture, stable L1 compression fracture, extensive lumbar spondylosis and postsurgical changes, left 7th rib fx anteriorly, left pleural effusion. Intubated 4/3 and 4/8 s/p rod connection for T11 fx. 4/9 failed attempt extubation. Trach on 4/12 trach placement.  PMH restrictive lung disease, emphysema/COPD adenocarcinoma s/p LUL lobectomy Crohn's, Psoriasis, Afib, nephrolithiasis, HTN, DM, obesity, thyroid cancer s/p RAI and total thyroidectomy (1999), and prostate cancer s/p  prostatectomy (2012) but with recurrence      SLP Plan  Continue with current plan of care       Recommendations  Diet recommendations: NPO Medication Administration: Via alternative means      Patient may use Passy-Muir Speech Valve: During all therapies with supervision;Intermittently with supervision PMSV Supervision: Full         Oral Care Recommendations: Oral care QID Follow up Recommendations: Inpatient Rehab SLP Visit Diagnosis: Dysphagia, pharyngeal phase (R13.13) Plan: Continue with current plan of care       GO                Devin [name] 10/02/2020, 10:32 AM  Devin [name], [place] Office number [phone] Pager [phone]

## 2020-10-02 NOTE — Progress Notes (Signed)
PROGRESS NOTE  Devin [name]  DOB: 10-02-1940  PCP: Prince Solian, MD TXH:[id number]  DOA: 09/10/2020  LOS: 22 days   Chief Complaint  Patient presents with  . Back Pain  . Vomiting   Brief narrative: Devin [name] is a 80 y.o. male with PMH significant for DM2, HTN, A. 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  On 3/31, patient fell while shopping at a bookstore.  No head trauma or LOC but had a new onset back pain.  4/1, seen by PCP and underwent thoracic, lumbar, and chest x-ray. Found to have new T11 Chance fracture.He also has a stable L1 compression fracture, extensive lumbar spondylosis and postsurgical changes. Additionally hada left seventh rib fracture anteriorly and a left pleural effusion without any obvious pneumothorax.  Pain control was tried as an outpatient with hydrocodone and Robaxin but it became intolerable.  Additionally, he was not able to use his incentive spirometer because of chest pain from rib fracture.  He continued to have progressive shortness of breath and hence presented to ED on 4/3 with excruciating pain, agitation, shortness of breath.  In the ED, patient required intubation and admitted to ICU. Seen by neurosurgery.  Significant Hospital events  4/8, neurosurgery did laking of the prior rod systems (T7-11 and T11-L4 rods) across T11 fracture.  4/9- failed attempt extubation. Decadron X 4 doses for airway edema   4/10 IVFs increased for mild Cr bump  4/11 Failed extubation 2/2 progressive weakness, worse cough mechanics (essentially just fatigued and progressed to respiratory failure) re-intubated. Sputum sent post re-intubation   4/12 trach at bedside.   4/13 sputum grew Klebsiella. Low gd temp/ongoing leukocytosis so Ceftriaxone started. GI consulted for  diarrhea in context of known chron's   4/14-4/18 -gradually transition to trach collar  4/20 transferred out to hospitalist service  4/21-insurance denied CIR transfer  4/22 failed barium swallow evaluation  4/23-CT scan of neck soft tissue was obtained which raised suspicion of laryngeal carcinoma.  Subjective: Patient was seen and examined this morning.  Lying on bed.  Not in distress.  Alert, awake, nods head and moves lips to communicate. Wife and daughter at bedside. We discussed about the CT scan neck findings suspicious of laryngeal carcinoma.   Discussed with ENT Dr. Fredric Dine.  To be seen by her this afternoon.  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  Reviewed extubation trials in ICU.  Underwent tracheostomy on 4/12 -Currently on 5 L oxygen by tracheostomy tube -Continue tracheostomy care.  Continue frequent suctioning.  Klebsiella pneumonia Small loculated left effusion -Completed treatment with IV ceftriaxone -Continue yupelri and Pulmicort  Dysphagia -Currently has tube feeding through Dobbhoff.  Speech therapy following.    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  Per speech therapy note, he seems to have a thickening of the prevertebral tissue at the level of C4-5.   -4/23, CT scan soft tissue neck was obtained which raised suspicion of laryngeal carcinoma.  Discussed with ENT Dr. Fredric Dine.  To be seen by her this afternoon.  May need endoscopic evaluation and biopsy. -Eliquis last dose on the morning of 4/24  Acute anemia of critical illness -Based hemoglobin more than 11.  For the last 1 week, patient hemoglobin has been running between 9-10.  -Creatinine trending  down, 7.7 today without any active blood loss. Recent Labs    09/23/20 0153 09/28/20 0019  09/30/20 0324 10/01/20 0346 10/02/20 0748  HGB 9.4* 9.8* 8.5* 8.4* 7.7*  MCV 94.8 97.5 96.2 95.3 94.3  VITAMINB12  --   --   --  397  --   FOLATE  --   --   --  11.6  --   FERRITIN  --   --   --  119  --   TIBC  --   --   --  349  --   IRON  --   --   --  26*  --   RETICCTPCT  --   --   --  2.5  --     Acute kidney injury  -Likely due to aggressive diuresis and diarrhea -Creatinine has normalized now. -Diuretics on hold. -On free water 200 mL every 6 hours through G-tube. -Closely watch fluid balance Recent Labs    09/21/20 0801 09/22/20 0317 09/23/20 0153 09/24/20 0328 09/25/20 0445 09/26/20 0337 09/28/20 0019 09/30/20 0324 10/01/20 0346 10/02/20 0748  BUN 20 24* 34* 42* 46* 50* 50* 51* 50* 48*  CREATININE 0.95 1.02 1.12 1.11 1.28* 1.22 1.10 1.16 1.15 1.13   Paroxysmal Afib  -Was in RVR intermittently in the hospital. -Currently on normal sinus rhythm on atenolol and diltiazem. -Keep Eliquis on hold  Essential hypertension -Blood pressure controlled on atenolol and diltiazem.  Diuretics on hold.  Type 2 diabetes mellitus -A1c 6.4 on 4/3. -Currently blood sugars controlled on Lantus 12 units twice daily.   -Continue sliding scale insulin with Accu-Cheks Recent Labs  Lab 10/01/20 2012 10/02/20 0029 10/02/20 0428 10/02/20 0729 10/02/20 1126  GLUCAP 174* 210* 165* 184* 115*   Acute diarrhea History of Crohn's disease -Per patient's wife prior to admission, patient was on mesalamine 1600 mg twice daily and as needed loperamide.  Patient is currently not able to get mesalamine because it cannot be crushed.  -He is also on liquid/tube feeding probably making diarrhea worse.  Currently on Imodium scheduled 3 times daily and fiber.  Continue the same.  T11-T12 Chance fracture -Status postsurgical stabilization by neurosurgery -Continue PT/OT  History of ankylosing spondylitis, kyphoscoliosis -Patient's wife, patient was physically functional prior to this  hospitalization.  Continue PT/OT  History of adenocarcinoma s/p LUL lobectomy (2016) History of thyroid and prostate cancer -Continue outpatient follow-up with pulmonology.  Hypothyroidism Continue Synthroid   Impaired mobility -PT eval obtained.  CIR recommended.  But it was denied by his insurance.    Mobility: Needs continued therapy while in the hospital to prevent deconditioning. Code Status:   Code Status: Full Code  Nutritional status: Body mass index is 26.78 kg/m. Nutrition Problem: Inadequate oral intake Etiology: inability to eat Signs/Symptoms: NPO status Diet Order            Diet NPO time specified  Diet effective now                 DVT prophylaxis: SCD's Start: 09/15/20 1734   Antimicrobials:  None Fluid: Free water through tube feeding.  Not on IV fluid. Consultants: Critical care Family Communication:  Not at bedside  Status is: Inpatient  Remains inpatient appropriate because: New tracheostomy, has feeding through [place].   Dispo: The patient is from: Home              Anticipated d/c is to: CIR if authorized by insurance.  Not medically stable for next several days  Patient currently is not medically stable to d/c.   Difficult to place patient No    Infusions:  . sodium chloride 250 mL (09/23/20 1109)  . feeding supplement (OSMOLITE 1.5 CAL) 55 mL/hr at 10/01/20 1517    Scheduled Meds: . acetaminophen (TYLENOL) oral liquid 160 mg/5 mL  500 mg Per Tube Q6H  . atenolol  25 mg Per NG tube BID  . bethanechol  5 mg Per NG tube QID  . budesonide (PULMICORT) nebulizer solution  0.5 mg Nebulization BID  . busPIRone  15 mg Per Tube BID  . chlorhexidine  15 mL Mouth/Throat BID  . cholestyramine  4 g Per Tube BID AC  . diltiazem  30 mg Per Tube Q6H  . feeding supplement (PROSource TF)  45 mL Per Tube TID  . fiber  1 packet Per Tube BID  . free water  200 mL Per Tube Q6H  . Gerhardt's butt cream   Topical BID  . insulin aspart   0-20 Units Subcutaneous Q4H  . insulin glargine  12 Units Subcutaneous BID  . levothyroxine  125 mcg Per Tube QAC breakfast  . loperamide HCl  4 mg Per Tube TID  . mouth rinse  15 mL Mouth Rinse 10 times per day  . nutrition supplement (JUVEN)  1 packet Per Tube BID BM  . revefenacin  175 mcg Nebulization Daily    Antimicrobials: Anti-infectives (From admission, onward)   Start     Dose/Rate Route Frequency Ordered Stop   09/20/20 1000  cefTRIAXone (ROCEPHIN) 2 g in sodium chloride 0.9 % 100 mL IVPB        2 g 200 mL/hr over 30 Minutes Intravenous Every 24 hours 09/20/20 0821 09/25/20 0755   09/15/20 1830  ceFAZolin (ANCEF) IVPB 2g/100 mL premix        2 g 200 mL/hr over 30 Minutes Intravenous Every 8 hours 09/15/20 1733 09/16/20 0232   09/15/20 1616  vancomycin (VANCOCIN) powder  Status:  Discontinued          As needed 09/15/20 1616 09/15/20 1648   09/15/20 0800  ceFAZolin (ANCEF) IVPB 2g/100 mL premix        2 g 200 mL/hr over 30 Minutes Intravenous To Short Stay 09/15/20 0704 09/15/20 1515   09/10/20 1300  ceFEPIme (MAXIPIME) 2 g in sodium chloride 0.9 % 100 mL IVPB  Status:  Discontinued        2 g 200 mL/hr over 30 Minutes Intravenous  Once 09/10/20 1245 09/10/20 1403   09/10/20 1300  metroNIDAZOLE (FLAGYL) IVPB 500 mg  Status:  Discontinued        500 mg 100 mL/hr over 60 Minutes Intravenous  Once 09/10/20 1245 09/10/20 1403   09/10/20 1300  vancomycin (VANCOCIN) IVPB 1000 mg/200 mL premix  Status:  Discontinued        1,000 mg 200 mL/hr over 60 Minutes Intravenous  Once 09/10/20 1245 09/10/20 1415      PRN meds: sodium chloride, acetaminophen **OR** acetaminophen, ALPRAZolam, diphenhydrAMINE, loperamide HCl, ondansetron (ZOFRAN) IV, oxyCODONE, phenol   Objective: Vitals:   10/02/20 1141 10/02/20 1212  BP:    Pulse: 78 72  Resp: (!) 28 (!) 29  Temp: 97.8 F (36.6 C)   SpO2:  90%    Intake/Output Summary (Last 24 hours) at 10/02/2020 1422 Last data filed at  10/02/2020 1100 Gross per 24 hour  Intake 155.83 ml  Output 2450 ml  Net -2294.17 ml   Filed Weights   09/27/20  0500 09/28/20 0226 09/29/20 0500  Weight: 82.8 kg 82.8 kg 82.3 kg   Weight change:  Body mass index is 26.78 kg/m.   Physical Exam: General exam: Pleasant, elderly Caucasian male.  Not in distress Skin: No rashes, lesions or ulcers. HEENT: Atraumatic, normocephalic, no obvious bleeding.  Has tracheostomy tube anteriorly.  Dobbhoff tube in place Lungs: Clear to auscultation bilaterally CVS: Regular rate and rhythm, no murmur GI/Abd soft, mild generalized tenderness with Crohn's disease, bowel sound present CNS: Nods his head and moves lips to answer questions.  Alert, awake, oriented x3 Psychiatry: Depressed look Extremities: No pedal edema, no calf tenderness  Data Review: I have personally reviewed the laboratory data and studies available.  Recent Labs  Lab 09/28/20 0019 09/30/20 0324 10/01/20 0346 10/02/20 0748  WBC 11.7* 13.1* 12.3* 12.3*  NEUTROABS 9.9* 10.6* 9.9* 10.0*  HGB 9.8* 8.5* 8.4* 7.7*  HCT 31.5* 27.6* 26.6* 24.9*  MCV 97.5 96.2 95.3 94.3  PLT 400 380 320 378   Recent Labs  Lab 09/26/20 0337 09/28/20 0019 09/30/20 0324 10/01/20 0346 10/02/20 0748  NA 136 136 135 133* 130*  K 4.5 5.0 5.1 5.2* 5.5*  CL 94* 94* 92* 94* 92*  CO2 37* 36* 36* 30 33*  GLUCOSE 169* 204* 243* 206* 188*  BUN 50* 50* 51* 50* 48*  CREATININE 1.22 1.10 1.16 1.15 1.13  CALCIUM 8.3* 8.7* 8.8* 8.5* 8.6*  MG  --  2.0  --   --   --   PHOS  --  3.2  --   --   --     F/u labs ordered Unresulted Labs (From admission, onward)          Start     Ordered   10/01/20 0500  CBC with Differential/Platelet  Daily,   R     Question:  Specimen collection method  Answer:  Lab=Lab collect   09/30/20 1034   10/01/20 8309  Basic metabolic panel  Daily,   R     Question:  Specimen collection method  Answer:  Lab=Lab collect   09/30/20 1034          Signed, Terrilee Croak,  MD [facility] 10/02/2020

## 2020-10-03 ENCOUNTER — Inpatient Hospital Stay: Payer: Medicare Other

## 2020-10-03 ENCOUNTER — Inpatient Hospital Stay: Payer: Medicare Other | Admitting: Internal Medicine

## 2020-10-03 DIAGNOSIS — J9622 Acute and chronic respiratory failure with hypercapnia: Secondary | ICD-10-CM | POA: Diagnosis not present

## 2020-10-03 DIAGNOSIS — J9621 Acute and chronic respiratory failure with hypoxia: Secondary | ICD-10-CM | POA: Diagnosis not present

## 2020-10-03 LAB — CBC WITH DIFFERENTIAL/PLATELET
Abs Immature Granulocytes: 0.06 10*3/uL (ref 0.00–0.07)
Basophils Absolute: 0 10*3/uL (ref 0.0–0.1)
Basophils Relative: 0 %
Eosinophils Absolute: 0.1 10*3/uL (ref 0.0–0.5)
Eosinophils Relative: 1 %
HCT: 24.9 % — ABNORMAL LOW (ref 39.0–52.0)
Hemoglobin: 7.8 g/dL — ABNORMAL LOW (ref 13.0–17.0)
Immature Granulocytes: 1 %
Lymphocytes Relative: 7 %
Lymphs Abs: 0.9 10*3/uL (ref 0.7–4.0)
MCH: 29.4 pg (ref 26.0–34.0)
MCHC: 31.3 g/dL (ref 30.0–36.0)
MCV: 94 fL (ref 80.0–100.0)
Monocytes Absolute: 1.2 10*3/uL — ABNORMAL HIGH (ref 0.1–1.0)
Monocytes Relative: 9 %
Neutro Abs: 10.8 10*3/uL — ABNORMAL HIGH (ref 1.7–7.7)
Neutrophils Relative %: 82 %
Platelets: 400 10*3/uL (ref 150–400)
RBC: 2.65 MIL/uL — ABNORMAL LOW (ref 4.22–5.81)
RDW: 13.5 % (ref 11.5–15.5)
WBC: 13 10*3/uL — ABNORMAL HIGH (ref 4.0–10.5)
nRBC: 0 % (ref 0.0–0.2)

## 2020-10-03 LAB — GLUCOSE, CAPILLARY
Glucose-Capillary: 122 mg/dL — ABNORMAL HIGH (ref 70–99)
Glucose-Capillary: 140 mg/dL — ABNORMAL HIGH (ref 70–99)
Glucose-Capillary: 155 mg/dL — ABNORMAL HIGH (ref 70–99)
Glucose-Capillary: 173 mg/dL — ABNORMAL HIGH (ref 70–99)
Glucose-Capillary: 180 mg/dL — ABNORMAL HIGH (ref 70–99)
Glucose-Capillary: 194 mg/dL — ABNORMAL HIGH (ref 70–99)
Glucose-Capillary: 195 mg/dL — ABNORMAL HIGH (ref 70–99)

## 2020-10-03 LAB — BASIC METABOLIC PANEL
Anion gap: 8 (ref 5–15)
BUN: 47 mg/dL — ABNORMAL HIGH (ref 8–23)
CO2: 32 mmol/L (ref 22–32)
Calcium: 8.6 mg/dL — ABNORMAL LOW (ref 8.9–10.3)
Chloride: 89 mmol/L — ABNORMAL LOW (ref 98–111)
Creatinine, Ser: 1.2 mg/dL (ref 0.61–1.24)
GFR, Estimated: >60 mL/min (ref >60)
Glucose, Bld: 183 mg/dL — ABNORMAL HIGH (ref 70–99)
Potassium: 5.1 mmol/L (ref 3.5–5.1)
Sodium: 129 mmol/L — ABNORMAL LOW (ref 135–145)

## 2020-10-03 LAB — PHOSPHORUS: Phosphorus: 3.7 mg/dL (ref 2.5–4.6)

## 2020-10-03 LAB — MAGNESIUM: Magnesium: 2.1 mg/dL (ref 1.7–2.4)

## 2020-10-03 NOTE — Progress Notes (Signed)
PROGRESS NOTE  Devin [name]  DOB: Jan 27, 1941  PCP: Prince Solian, MD HYW:[id number]  DOA: 09/10/2020  LOS: 23 days   Chief Complaint  Patient presents with  . Back Pain  . Vomiting   Brief narrative: Devin [name] is a 80 y.o. male with PMH significant for DM2, HTN, A. 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  On 3/31, patient fell while shopping at a bookstore.  No head trauma or LOC but had a new onset back pain.  4/1, seen by PCP and underwent thoracic, lumbar, and chest x-ray. Found to have new T11 Chance fracture.He also has a stable L1 compression fracture, extensive lumbar spondylosis and postsurgical changes. Additionally hada left seventh rib fracture anteriorly and a left pleural effusion without any obvious pneumothorax.  Pain control was tried as an outpatient with hydrocodone and Robaxin but it became intolerable.  Additionally, he was not able to use his incentive spirometer because of chest pain from rib fracture.  He continued to have progressive shortness of breath and hence presented to ED on 4/3 with excruciating pain, agitation, shortness of breath.  In the ED, patient required intubation and admitted to ICU. Seen by neurosurgery.  Significant Hospital events  4/8, neurosurgery did laking of the prior rod systems (T7-11 and T11-L4 rods) across T11 fracture.  4/9- failed attempt extubation. Decadron X 4 doses for airway edema   4/10 IVFs increased for mild Cr bump  4/11 Failed extubation 2/2 progressive weakness, worse cough mechanics (essentially just fatigued and progressed to respiratory failure) re-intubated. Sputum sent post re-intubation   4/12 trach at bedside.   4/13 sputum grew Klebsiella. Low gd temp/ongoing leukocytosis so Ceftriaxone started. GI consulted for  diarrhea in context of known chron's   4/14-4/18 -gradually transition to trach collar  4/20 transferred out to hospitalist service  4/21-insurance denied CIR transfer  4/22 failed barium swallow evaluation  4/23-CT scan of neck soft tissue was obtained which raised suspicion of laryngeal carcinoma.  Subjective: Patient was seen and examined this morning.  Lying on bed.  Not in distress.  Alert, awake, nods head and moves lips to communicate. Wife at bedside. Seen ENT yesterday.  Underwent bedside laryngoscopy.  Noted a plan for biopsy in 1 to 2 days..  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  Reviewed extubation trials in ICU.  Underwent tracheostomy on 4/12 -Currently on 5 L oxygen by tracheostomy tube -Continue tracheostomy care.  Continue frequent suctioning.  Klebsiella pneumonia Small loculated left effusion -Completed treatment with IV ceftriaxone -Continue yupelri and Pulmicort  Dysphagia -Currently has tube feeding through Dobbhoff.  Speech therapy following.    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  Per speech therapy note, he seems to have a thickening of the prevertebral tissue at the level of C4-5.   -4/23, CT scan soft tissue neck was obtained which raised suspicion of laryngeal carcinoma.  Seen by ENT Dr. Fredric Dine on 4/25.  Underwent bedside laryngoscopy.  Noted a plan to do a biopsy in OR in 1 to 2 days. -Eliquis last dose on the morning of 4/24  Acute anemia of critical illness -Based hemoglobin more than 11.  For the last 1 week, patient hemoglobin has been running between 9-10.  -Creatinine trending down, 7.7 today without any  active blood loss. Recent Labs    09/28/20 0019 09/30/20 0324 10/01/20 0346 10/02/20 0748 10/03/20 0154  HGB 9.8* 8.5*  8.4* 7.7* 7.8*  MCV 97.5 96.2 95.3 94.3 94.0  VITAMINB12  --   --  397  --   --   FOLATE  --   --  11.6  --   --   FERRITIN  --   --  119  --   --   TIBC  --   --  349  --   --   IRON  --   --  26*  --   --   RETICCTPCT  --   --  2.5  --   --     Acute kidney injury  -Likely due to aggressive diuresis and diarrhea -Creatinine has normalized now. -Diuretics on hold. -On free water 200 mL every 6 hours through G-tube. -Closely watch fluid balance Recent Labs    09/22/20 0317 09/23/20 0153 09/24/20 0328 09/25/20 0445 09/26/20 0337 09/28/20 0019 09/30/20 0324 10/01/20 0346 10/02/20 0748 10/03/20 0154  BUN 24* 34* 42* 46* 50* 50* 51* 50* 48* 47*  CREATININE 1.02 1.12 1.11 1.28* 1.22 1.10 1.16 1.15 1.13 1.20   Paroxysmal Afib  -Was in RVR intermittently in the hospital. -Currently on normal sinus rhythm on atenolol and diltiazem. -Keep Eliquis on hold  Essential hypertension -Blood pressure controlled on atenolol and diltiazem.  Diuretics on hold.  Type 2 diabetes mellitus -A1c 6.4 on 4/3. -Currently blood sugars controlled on Lantus 12 units twice daily.   -Continue sliding scale insulin with Accu-Cheks Recent Labs  Lab 10/02/20 2023 10/03/20 0011 10/03/20 0411 10/03/20 0805 10/03/20 1119  GLUCAP 183* 194* 155* 140* 195*   Acute diarrhea History of Crohn's disease -Per patient's wife prior to admission, patient was on mesalamine 1600 mg twice daily and as needed loperamide.  Patient is currently not able to get mesalamine because it cannot be crushed.  -He is also on liquid/tube feeding probably making diarrhea worse.  Currently on Imodium scheduled 3 times daily and fiber.  Continue the same.  T11-T12 Chance fracture -Status postsurgical stabilization by neurosurgery -Continue PT/OT  History of ankylosing spondylitis, kyphoscoliosis -Patient's wife, patient was physically functional prior to this hospitalization.  Continue PT/OT  History of adenocarcinoma  s/p LUL lobectomy (2016) History of thyroid and prostate cancer -Continue outpatient follow-up with pulmonology.  Hypothyroidism Continue Synthroid   Impaired mobility -PT eval obtained.  CIR recommended.  But it was denied by his insurance.    Mobility: Needs continued therapy while in the hospital to prevent deconditioning. Code Status:   Code Status: Full Code  Nutritional status: Body mass index is 26.78 kg/m. Nutrition Problem: Inadequate oral intake Etiology: inability to eat Signs/Symptoms: NPO status Diet Order            Diet NPO time specified  Diet effective now                 DVT prophylaxis: SCD's Start: 09/15/20 1734   Antimicrobials:  None Fluid: Free water through tube feeding.  Not on IV fluid. Consultants: Critical care Family Communication:  Not at bedside  Status is: Inpatient  Remains inpatient appropriate because: New tracheostomy, has feeding through [place].   Dispo: The patient is from: Home              Anticipated d/c is to: CIR if authorized by insurance.  Not medically stable for next several days  Patient currently is not medically stable to d/c.   Difficult to place patient No    Infusions:  . sodium chloride 250 mL (09/23/20 1109)  . feeding supplement (OSMOLITE 1.5 CAL) 55 mL/hr at 10/01/20 1517    Scheduled Meds: . acetaminophen (TYLENOL) oral liquid 160 mg/5 mL  500 mg Per Tube Q6H  . atenolol  25 mg Per NG tube BID  . bethanechol  5 mg Per NG tube QID  . budesonide (PULMICORT) nebulizer solution  0.5 mg Nebulization BID  . busPIRone  15 mg Per Tube BID  . chlorhexidine  15 mL Mouth/Throat BID  . cholestyramine  4 g Per Tube BID AC  . diltiazem  30 mg Per Tube Q6H  . feeding supplement (PROSource TF)  45 mL Per Tube TID  . fiber  1 packet Per Tube BID  . free water  200 mL Per Tube Q6H  . Gerhardt's butt cream   Topical BID  . insulin aspart  0-20 Units Subcutaneous Q4H  . insulin glargine  12 Units  Subcutaneous BID  . levothyroxine  125 mcg Per Tube QAC breakfast  . loperamide HCl  4 mg Per Tube TID  . mouth rinse  15 mL Mouth Rinse 10 times per day  . nutrition supplement (JUVEN)  1 packet Per Tube BID BM  . revefenacin  175 mcg Nebulization Daily    Antimicrobials: Anti-infectives (From admission, onward)   Start     Dose/Rate Route Frequency Ordered Stop   09/20/20 1000  cefTRIAXone (ROCEPHIN) 2 g in sodium chloride 0.9 % 100 mL IVPB        2 g 200 mL/hr over 30 Minutes Intravenous Every 24 hours 09/20/20 0821 09/25/20 0755   09/15/20 1830  ceFAZolin (ANCEF) IVPB 2g/100 mL premix        2 g 200 mL/hr over 30 Minutes Intravenous Every 8 hours 09/15/20 1733 09/16/20 0232   09/15/20 1616  vancomycin (VANCOCIN) powder  Status:  Discontinued          As needed 09/15/20 1616 09/15/20 1648   09/15/20 0800  ceFAZolin (ANCEF) IVPB 2g/100 mL premix        2 g 200 mL/hr over 30 Minutes Intravenous To Short Stay 09/15/20 0704 09/15/20 1515   09/10/20 1300  ceFEPIme (MAXIPIME) 2 g in sodium chloride 0.9 % 100 mL IVPB  Status:  Discontinued        2 g 200 mL/hr over 30 Minutes Intravenous  Once 09/10/20 1245 09/10/20 1403   09/10/20 1300  metroNIDAZOLE (FLAGYL) IVPB 500 mg  Status:  Discontinued        500 mg 100 mL/hr over 60 Minutes Intravenous  Once 09/10/20 1245 09/10/20 1403   09/10/20 1300  vancomycin (VANCOCIN) IVPB 1000 mg/200 mL premix  Status:  Discontinued        1,000 mg 200 mL/hr over 60 Minutes Intravenous  Once 09/10/20 1245 09/10/20 1415      PRN meds: sodium chloride, acetaminophen **OR** acetaminophen, ALPRAZolam, diphenhydrAMINE, loperamide HCl, ondansetron (ZOFRAN) IV, oxyCODONE, phenol   Objective: Vitals:   10/03/20 1153 10/03/20 1450  BP:    Pulse: 74 75  Resp: (!) 28 (!) 26  Temp:    SpO2: 95% 93%    Intake/Output Summary (Last 24 hours) at 10/03/2020 1451 Last data filed at 10/03/2020 0442 Gross per 24 hour  Intake --  Output 950 ml  Net -950 ml    Filed Weights   09/27/20 0500 09/28/20 0226 09/29/20  0500  Weight: 82.8 kg 82.8 kg 82.3 kg   Weight change:  Body mass index is 26.78 kg/m.   Physical Exam: General exam: Pleasant, elderly Caucasian male.  Not in distress Skin: No rashes, lesions or ulcers. HEENT: Atraumatic, normocephalic, no obvious bleeding.  Has tracheostomy tube anteriorly.  Tube feeding through [place]. Lungs: Clear to auscultation bilaterally CVS: Regular rate and rhythm, no murmur GI/Abd soft, mild generalized tenderness with Crohn's disease, bowel sound present CNS: Nods his head and moves lips to answer questions.  Alert, awake, oriented x3 Psychiatry: Depressed look Extremities: No pedal edema, no calf tenderness  Data Review: I have personally reviewed the laboratory data and studies available.  Recent Labs  Lab 09/28/20 0019 09/30/20 0324 10/01/20 0346 10/02/20 0748 10/03/20 0154  WBC 11.7* 13.1* 12.3* 12.3* 13.0*  NEUTROABS 9.9* 10.6* 9.9* 10.0* 10.8*  HGB 9.8* 8.5* 8.4* 7.7* 7.8*  HCT 31.5* 27.6* 26.6* 24.9* 24.9*  MCV 97.5 96.2 95.3 94.3 94.0  PLT 400 380 320 378 400   Recent Labs  Lab 09/28/20 0019 09/30/20 0324 10/01/20 0346 10/02/20 0748 10/03/20 0154  NA 136 135 133* 130* 129*  K 5.0 5.1 5.2* 5.5* 5.1  CL 94* 92* 94* 92* 89*  CO2 36* 36* 30 33* 32  GLUCOSE 204* 243* 206* 188* 183*  BUN 50* 51* 50* 48* 47*  CREATININE 1.10 1.16 1.15 1.13 1.20  CALCIUM 8.7* 8.8* 8.5* 8.6* 8.6*  MG 2.0  --   --   --   --   PHOS 3.2  --   --   --   --     F/u labs ordered Unresulted Labs (From admission, onward)         None      Signed, Terrilee Croak, MD [facility] 10/03/2020

## 2020-10-03 NOTE — Progress Notes (Signed)
Occupational Therapy Treatment Patient Details Name: Devin [name] MRN: [id number] DOB: 09-08-1940 Today's Date: 10/03/2020    History of present illness 80 yo male who presented 4/3 s/p fell at a bookstore 3/31 and obtained T11 chance fracture, stable L1 compression fracture, extensive lumbar spondylosis and postsurgical changes. Additionally had left 7th rib fx anteriorly and left pleural effusion. Pt intubated in ER on 4/3 for agitation and hypoxemia. 4/8 s/p rod connection for T11 fx. 4/9 failed attempt extubation.  4/12 trach placement intubated 4/3.  PMH restrictive lung disease, emphysema/COPD adenocarcinoma s/p LUL lobectomy Crohn's, Psoriasis, Afib, nephrolithiasis, HTN, DM, obesity, thyroid cancer s/p RAI and total thyroidectomy (1999), and prostate cancer s/p prostatectomy (2012) but with recurrence   OT comments  Pt progressing towards established OT goals. Pt requiring Max A for donning brace at EOB. Pt able to perform figure four position for Mod A to maintain position when adjusting socks. Pt requiring Min A +2 and RW for functional mobility. Updated goals. Continue to recommend dc to CIR and will continue to follow acutely as admitted.   Follow Up Recommendations  CIR    Equipment Recommendations  3 in 1 bedside commode;Other (comment) (RW)    Recommendations for Other Services Rehab consult    Precautions / Restrictions Precautions Precautions: Fall;Other (comment);Back Precaution Booklet Issued: No Precaution Comments: rectal tube, trach collar, cortrak Required Braces or Orthoses: Spinal Brace Spinal Brace: Thoracolumbosacral orthotic;Applied in sitting position Restrictions Weight Bearing Restrictions: No       Mobility Bed Mobility Overal bed mobility: Needs Assistance Bed Mobility: Rolling;Sidelying to Sit Rolling: Min assist Sidelying to sit: Mod assist;+2 for physical assistance;+2 for safety/equipment       General bed mobility comments: min assist  for roll to L for truncal translation, mod assist +2 for sidelying to sit for trunk elevation, scooting LEs over EOB, and steadying in sitting.    Transfers Overall transfer level: Needs assistance Equipment used: Rolling walker (2 wheeled) Transfers: Sit to/from Stand Sit to Stand: Min assist;+2 safety/equipment         General transfer comment: min +2 for rise, steady, and lines/leads. Pt with posterior bias in standing, corrected with continued standing    Balance Overall balance assessment: Needs assistance Sitting-balance support: Bilateral upper extremity supported;Feet supported;Single extremity supported Sitting balance-Leahy Scale: Fair Sitting balance - Comments: Pt with preference for UE support on bed.   Standing balance support: Bilateral upper extremity supported;During functional activity Standing balance-Leahy Scale: Poor Standing balance comment: Reliant on bil UE support and external assist.                           ADL either performed or assessed with clinical judgement   ADL Overall ADL's : Needs assistance/impaired                 Upper Body Dressing : Maximal assistance;Sitting Upper Body Dressing Details (indicate cue type and reason): Max A to don Lower Body Dressing: Moderate assistance;Sit to/from stand Lower Body Dressing Details (indicate cue type and reason): Mod A to bring and maintain ankle to knees for adjusting socks Toilet Transfer: Minimal assistance;+2 for physical assistance;+2 for safety/equipment;Ambulation;RW (simulated to recliner) Toilet Transfer Details (indicate cue type and reason): MIn A for support and RW management.         Functional mobility during ADLs: Rolling walker;Cueing for safety;Minimal assistance;+2 for physical assistance;+2 for safety/equipment General ADL Comments: performing functional mobility and LB dressing  Vision       Perception     Praxis      Cognition Arousal/Alertness:  Awake/alert Behavior During Therapy: WFL for tasks assessed/performed Overall Cognitive Status: Difficult to assess Area of Impairment: Memory;Following commands                     Memory: Decreased recall of precautions Following Commands: Follows one step commands with increased time       General Comments: Pt with flat affect intermittently during session, but also with periods of telling jokes and laughing. Pt follows cuing for increased time        Exercises     Shoulder Instructions       General Comments SpO2 pleth with inconsistent reading, 90% and greater on 5L/28% when accurate. Session cut short due to pt's pastor arriving to room.    Pertinent Vitals/ Pain       Pain Assessment: Faces Faces Pain Scale: Hurts even more Pain Location: back moving stand>sit Pain Descriptors / Indicators: Sore;Grimacing;Discomfort Pain Intervention(s): Monitored during session;Limited activity within patient's tolerance;Repositioned  Home Living                                          Prior Functioning/Environment              Frequency  Min 2X/week        Progress Toward Goals  OT Goals(current goals can now be found in the care plan section)  Progress towards OT goals: Progressing toward goals  Acute Rehab OT Goals Patient Stated Goal: to walk OT Goal Formulation: With patient/family Time For Goal Achievement: 10/17/20 Potential to Achieve Goals: Good ADL Goals Pt Will Perform Grooming: with set-up;with supervision;sitting Pt Will Perform Upper Body Bathing: with min assist;sitting Pt Will Transfer to Toilet: with min assist;ambulating;bedside commode;with +2 assist Additional ADL Goal #1: Pt will perform bed mobility with Min A in preparation for ADLs Additional ADL Goal #2: pt will don brace min level as precursor to adls.  Plan Discharge plan remains appropriate    Co-evaluation    PT/OT/SLP Co-Evaluation/Treatment:  Yes Reason for Co-Treatment: For patient/therapist safety;To address functional/ADL transfers   OT goals addressed during session: ADL's and self-care      AM-PAC OT "6 Clicks" Daily Activity     Outcome Measure   Help from another person eating meals?: Total Help from another person taking care of personal grooming?: A Lot Help from another person toileting, which includes using toliet, bedpan, or urinal?: A Lot Help from another person bathing (including washing, rinsing, drying)?: A Lot Help from another person to put on and taking off regular upper body clothing?: A Lot Help from another person to put on and taking off regular lower body clothing?: A Lot 6 Click Score: 11    End of Session Equipment Utilized During Treatment: Gait belt;Oxygen;Back brace  OT Visit Diagnosis: Unsteadiness on feet (R26.81);Muscle weakness (generalized) (M62.81)   Activity Tolerance Patient tolerated treatment well   Patient Left with call bell/phone within reach;in chair;with chair alarm set;with family/visitor present   Nurse Communication Mobility status;Precautions        Time: 2878-6767 OT Time Calculation (min): 23 min  Charges: OT General Charges $OT Visit: 1 Visit OT Treatments $Self Care/Home Management : 8-22 mins  [place], OTR/L Acute Rehab Pager: [phone] Office: [place]  Devin [name] 10/03/2020, 5:03 PM

## 2020-10-03 NOTE — Anesthesia Preprocedure Evaluation (Addendum)
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 on medications +CHF (grade 1 diastolic dysfunction)  + Valvular Problems/Murmurs (mild MR) MR  Rhythm:Regular Rate:Normal  Echo 2021:  Study Result    ECHOCARDIOGRAM REPORT       Patient Name:  Devin [name] Date of Exam: 01/05/2020  Medical Rec #: [id number]     Height:    70.0 in  Accession #:  [id number]    Weight:    180.0 lb  Date of Birth: 1940/08/14     BSA:     1.996 m  Patient Age:  80 years     BP:      167/71 mmHg  Patient Gender: M         HR:      91 bpm.  Exam Location: Inpatient   Procedure: 2D Echo, Cardiac Doppler and Color Doppler   Indications:  Elevated Troponin    History:    Patient has no prior history of Echocardiogram  examinations.         Risk Factors:Hypertension and Diabetes. Cancer.  Hypothyroidism.    Sonographer:  Jonelle Sidle Dance  Referring Phys: [id number] ANASTASSIA DOUTOVA     Sonographer Comments: Suboptimal subcostal window.  IMPRESSIONS    1. Left ventricular ejection fraction, by estimation, is 60 to 65%. The  left ventricle has normal function. The left ventricle has no regional  wall motion abnormalities. There is mild left ventricular hypertrophy of  the posterior segment. Left  ventricular diastolic parameters are consistent  with Grade I diastolic  dysfunction (impaired relaxation).  2. Right ventricular systolic function is normal. The right ventricular  size is normal. There is normal pulmonary artery systolic pressure.  3. The mitral valve is normal in structure. Mild mitral valve  regurgitation. No evidence of mitral stenosis.  4. The aortic valve is tricuspid. Aortic valve regurgitation is not  visualized. No aortic stenosis is present.  5. The inferior vena cava is normal in size with greater than 50%  respiratory variability, suggesting right atrial pressure of 3 mmHg.      Neuro/Psych negative neurological ROS  negative psych ROS   GI/Hepatic negative GI ROS, Neg liver ROS,   Endo/Other  diabetes, Well Controlled, Type 2, Insulin DependentHypothyroidism (hx thyroid ca 1999)   Renal/GU Renal InsufficiencyRenal diseaseCr 1.2   Hx prostate ca w/ recurrance     Musculoskeletal  (+) Arthritis , Osteoarthritis,  Multiple vertebral compression fractures Ankylosing spondylitis    Abdominal Normal abdominal exam  (+)   Peds  Hematology  (+) Blood dyscrasia, anemia , H/H 7.8/24.9, plt 400   Anesthesia Other Findings S/p tracheostomy 09/19/20 at bedside for failed extubation  Reproductive/Obstetrics negative OB ROS                           Anesthesia Physical Anesthesia Plan  ASA: IV  Anesthesia Plan: General   Post-op  Pain Management:    Induction: Intravenous  PONV Risk Score and Plan: 2 and Treatment may vary due to age or medical condition  Airway Management Planned: Tracheostomy  Additional Equipment: None  Intra-op Plan:   Post-operative Plan: Post-operative intubation/ventilation  Informed Consent:   Plan Discussed with:   Anesthesia Plan Comments: (Case postponed for failure to turn off tube feeds- ICU unsure if gastric or post-pyloric so I ordered KUB, which shows gastric tube (6h NPO).)       Anesthesia Quick Evaluation

## 2020-10-03 NOTE — Progress Notes (Signed)
Physical Therapy Treatment Patient Details Name: Devin [name] MRN: [id number] DOB: [birth year] Today's Date: 10/03/2020    History of Present Illness 80 yo male who presented 4/3 s/p fell at a bookstore 3/31 and obtained T11 chance fracture, stable L1 compression fracture, extensive lumbar spondylosis and postsurgical changes. Additionally had left 7th rib fx anteriorly and left pleural effusion. Pt intubated in ER on 4/3 for agitation and hypoxemia. 4/8 s/p rod connection for T11 fx. 4/9 failed attempt extubation.  4/12 trach placement intubated 4/3.  PMH restrictive lung disease, emphysema/COPD adenocarcinoma s/p LUL lobectomy Crohn's, Psoriasis, Afib, nephrolithiasis, HTN, DM, obesity, thyroid cancer s/p RAI and total thyroidectomy (1999), and prostate cancer s/p prostatectomy (2012) but with recurrence    PT Comments    Pt resting in bed upon arrival to room, agreeable to mobility with PT and OT today. Pt tolerated bed mobility, transfer, and short-distance gait this day with cuing for form and breathing technique throughout. Pt with multiple bouts of coughing at end of session, on the whole non-productive. PT educated pt on the importance of getting OOB with PT/OT/RN staff daily for pulmonary health, pt expresses understanding. Session cut short by pt's pastor arriving to room. Will continue to follow acutely.     Follow Up Recommendations  CIR     Equipment Recommendations  3in1 (PT);Rolling walker with 5" wheels    Recommendations for Other Services       Precautions / Restrictions Precautions Precautions: Fall;Other (comment);Back Precaution Booklet Issued: No Precaution Comments: rectal tube, trach collar, cortrak Required Braces or Orthoses: Spinal Brace Spinal Brace: Thoracolumbosacral orthotic;Applied in sitting position Restrictions Weight Bearing Restrictions: No    Mobility  Bed Mobility Overal bed mobility: Needs Assistance Bed Mobility: Rolling;Sidelying to  Sit Rolling: Min assist Sidelying to sit: Mod assist;+2 for physical assistance;+2 for safety/equipment       General bed mobility comments: min assist for roll to L for truncal translation, mod assist +2 for sidelying to sit for trunk elevation, scooting LEs over EOB, and steadying in sitting.    Transfers Overall transfer level: Needs assistance Equipment used: Rolling walker (2 wheeled) Transfers: Sit to/from Stand Sit to Stand: Min assist;+2 safety/equipment         General transfer comment: min +2 for rise, steady, and lines/leads. Pt with posterior bias in standing, corrected with continued standing  Ambulation/Gait Ambulation/Gait assistance: Min assist;+2 safety/equipment Gait Distance (Feet): 8 Feet Assistive device: Rolling walker (2 wheeled) Gait Pattern/deviations: Step-through pattern;Decreased stride length;Shuffle;Trunk flexed;Narrow base of support Gait velocity: decr   General Gait Details: min assist to steady, manage lines/leads, and reach recliner. Verbal cuing for upright posture, placement in RW, waiting for PT/OT assist   Stairs             Wheelchair Mobility    Modified Rankin (Stroke Patients Only)       Balance Overall balance assessment: Needs assistance Sitting-balance support: Bilateral upper extremity supported;Feet supported;Single extremity supported Sitting balance-Leahy Scale: Fair     Standing balance support: Bilateral upper extremity supported;During functional activity Standing balance-Leahy Scale: Poor Standing balance comment: Reliant on bil UE support and external assist.                            Cognition Arousal/Alertness: Awake/alert Behavior During Therapy: WFL for tasks assessed/performed Overall Cognitive Status: Difficult to assess  General Comments: Pt with flat affect intermittently during session, but also with periods of telling jokes and  laughing. Pt follows cuing for increased time      Exercises      General Comments General comments (skin integrity, edema, etc.): SpO2 pleth with inconsistent reading, 90% and greater on 5L/28% when accurate. Session cut short due to pt's pastor arriving to room.      Pertinent Vitals/Pain Pain Assessment: Faces Faces Pain Scale: Hurts even more Pain Location: back moving stand>sit Pain Descriptors / Indicators: Sore;Grimacing;Discomfort Pain Intervention(s): Limited activity within patient's tolerance;Monitored during session;Repositioned    Home Living                      Prior Function            PT Goals (current goals can now be found in the care plan section) Acute Rehab PT Goals Patient Stated Goal: to walk PT Goal Formulation: With patient/family Time For Goal Achievement: 10/04/20 Potential to Achieve Goals: Good Progress towards PT goals: Progressing toward goals    Frequency    Min 5X/week      PT Plan Current plan remains appropriate    Co-evaluation              AM-PAC PT "6 Clicks" Mobility   Outcome Measure  Help needed turning from your back to your side while in a flat bed without using bedrails?: A Little Help needed moving from lying on your back to sitting on the side of a flat bed without using bedrails?: A Little Help needed moving to and from a bed to a chair (including a wheelchair)?: A Little Help needed standing up from a chair using your arms (e.g., wheelchair or bedside chair)?: A Little Help needed to walk in hospital room?: A Little Help needed climbing 3-5 steps with a railing? : A Lot 6 Click Score: 17    End of Session Equipment Utilized During Treatment: Oxygen;Back brace Activity Tolerance: Patient tolerated treatment well Patient left: with call bell/phone within reach;in chair;with chair alarm set;with family/visitor present Nurse Communication: Mobility status PT Visit Diagnosis: Pain;Difficulty in  walking, not elsewhere classified (R26.2);Unsteadiness on feet (R26.81);Other abnormalities of gait and mobility (R26.89);Muscle weakness (generalized) (M62.81) Pain - Right/Left: Right Pain - part of body:  (back)     Time: 7681-1572 PT Time Calculation (min) (ACUTE ONLY): 23 min  Charges:  $Therapeutic Activity: 8-22 mins                     Stacie Glaze, PT DPT Acute Rehabilitation Services Pager [phone]  Office [phone]    Roxine Caddy E Ruffin Pyo 10/03/2020, 4:00 PM

## 2020-10-04 ENCOUNTER — Inpatient Hospital Stay (HOSPITAL_COMMUNITY): Payer: Medicare Other | Admitting: Anesthesiology

## 2020-10-04 ENCOUNTER — Inpatient Hospital Stay (HOSPITAL_COMMUNITY): Payer: Medicare Other

## 2020-10-04 ENCOUNTER — Encounter (HOSPITAL_COMMUNITY): Payer: Self-pay | Admitting: Pulmonary Disease

## 2020-10-04 ENCOUNTER — Encounter (HOSPITAL_COMMUNITY)
Admission: EM | Disposition: A | Discharge status: Rehabilitation Facility | Payer: Self-pay | Source: Home / Self Care | Attending: Internal Medicine

## 2020-10-04 DIAGNOSIS — J9621 Acute and chronic respiratory failure with hypoxia: Secondary | ICD-10-CM | POA: Diagnosis not present

## 2020-10-04 DIAGNOSIS — J9622 Acute and chronic respiratory failure with hypercapnia: Secondary | ICD-10-CM | POA: Diagnosis not present

## 2020-10-04 DIAGNOSIS — J150 Pneumonia due to Klebsiella pneumoniae: Secondary | ICD-10-CM | POA: Diagnosis not present

## 2020-10-04 DIAGNOSIS — S271XXA Traumatic hemothorax, initial encounter: Secondary | ICD-10-CM | POA: Diagnosis not present

## 2020-10-04 HISTORY — PX: ESOPHAGOSCOPY: SHX5534

## 2020-10-04 HISTORY — PX: DIRECT LARYNGOSCOPY: SHX5326

## 2020-10-04 LAB — GLUCOSE, CAPILLARY
Glucose-Capillary: 130 mg/dL — ABNORMAL HIGH (ref 70–99)
Glucose-Capillary: 151 mg/dL — ABNORMAL HIGH (ref 70–99)
Glucose-Capillary: 159 mg/dL — ABNORMAL HIGH (ref 70–99)
Glucose-Capillary: 175 mg/dL — ABNORMAL HIGH (ref 70–99)
Glucose-Capillary: 78 mg/dL (ref 70–99)

## 2020-10-04 SURGERY — LARYNGOSCOPY, DIRECT
Anesthesia: General | Site: Mouth

## 2020-10-04 MED ORDER — LIDOCAINE 2% (20 MG/ML) 5 ML SYRINGE
INTRAMUSCULAR | Status: DC | PRN
  Administered 2020-10-04: 40 mg via INTRAVENOUS

## 2020-10-04 MED ORDER — LACTATED RINGERS IV SOLN: INTRAVENOUS | Status: DC | PRN

## 2020-10-04 MED ORDER — GUAIFENESIN-DM 100-10 MG/5ML PO SYRP
5.0000 mL | ORAL_SOLUTION | Freq: Three times a day (TID) | ORAL | Status: DC
  Administered 2020-10-04 – 2020-10-06 (×7): 5 mL
  Filled 2020-10-04 (×7): qty 5

## 2020-10-04 MED ORDER — DEXAMETHASONE SODIUM PHOSPHATE 10 MG/ML IJ SOLN
INTRAMUSCULAR | Status: DC | PRN
  Administered 2020-10-04: 10 mg via INTRAVENOUS

## 2020-10-04 MED ORDER — LIDOCAINE 2% (20 MG/ML) 5 ML SYRINGE
INTRAMUSCULAR | Status: AC
  Filled 2020-10-04: qty 5

## 2020-10-04 MED ORDER — ALBUTEROL SULFATE (2.5 MG/3ML) 0.083% IN NEBU
2.5000 mg | INHALATION_SOLUTION | RESPIRATORY_TRACT | Status: DC | PRN
  Administered 2020-10-04 – 2020-10-05 (×4): 2.5 mg via RESPIRATORY_TRACT
  Filled 2020-10-04 (×4): qty 3

## 2020-10-04 MED ORDER — SUGAMMADEX SODIUM 200 MG/2ML IV SOLN
INTRAVENOUS | Status: DC | PRN
  Administered 2020-10-04: 200 mg via INTRAVENOUS

## 2020-10-04 MED ORDER — ALBUMIN HUMAN 5 % IV SOLN: INTRAVENOUS | Status: DC | PRN

## 2020-10-04 MED ORDER — PROPOFOL 10 MG/ML IV BOLUS
INTRAVENOUS | Status: DC | PRN
  Administered 2020-10-04: 100 mg via INTRAVENOUS

## 2020-10-04 MED ORDER — PHENYLEPHRINE 40 MCG/ML (10ML) SYRINGE FOR IV PUSH (FOR BLOOD PRESSURE SUPPORT)
PREFILLED_SYRINGE | INTRAVENOUS | Status: DC | PRN
  Administered 2020-10-04: 80 ug via INTRAVENOUS
  Administered 2020-10-04: 40 ug via INTRAVENOUS
  Administered 2020-10-04: 80 ug via INTRAVENOUS
  Administered 2020-10-04: 120 ug via INTRAVENOUS
  Administered 2020-10-04: 80 ug via INTRAVENOUS

## 2020-10-04 MED ORDER — FENTANYL CITRATE (PF) 250 MCG/5ML IJ SOLN
INTRAMUSCULAR | Status: AC
  Filled 2020-10-04: qty 5

## 2020-10-04 MED ORDER — ROCURONIUM BROMIDE 100 MG/10ML IV SOLN
INTRAVENOUS | Status: DC | PRN
  Administered 2020-10-04: 40 mg via INTRAVENOUS

## 2020-10-04 MED ORDER — LOPERAMIDE HCL 1 MG/7.5ML PO SUSP
4.0000 mg | Freq: Four times a day (QID) | ORAL | Status: DC
  Administered 2020-10-04 – 2020-10-06 (×4): 4 mg
  Filled 2020-10-04 (×11): qty 30

## 2020-10-04 MED ORDER — EPINEPHRINE HCL (NASAL) 0.1 % NA SOLN
NASAL | Status: DC | PRN
  Administered 2020-10-04: 1 [drp]

## 2020-10-04 MED ORDER — 0.9 % SODIUM CHLORIDE (POUR BTL) OPTIME
TOPICAL | Status: DC | PRN
  Administered 2020-10-04: 1000 mL

## 2020-10-04 MED ORDER — HEMOSTATIC AGENTS (NO CHARGE) OPTIME
TOPICAL | Status: DC | PRN
  Administered 2020-10-04: 1 via TOPICAL

## 2020-10-04 MED ORDER — ACETYLCYSTEINE 20 % IN SOLN
600.0000 mg | Freq: Two times a day (BID) | RESPIRATORY_TRACT | Status: DC
  Administered 2020-10-04: 200 mg via RESPIRATORY_TRACT
  Administered 2020-10-04 – 2020-10-06 (×4): 600 mg via RESPIRATORY_TRACT
  Filled 2020-10-04 (×7): qty 4

## 2020-10-04 MED ORDER — ONDANSETRON HCL 4 MG/2ML IJ SOLN
INTRAMUSCULAR | Status: DC | PRN
  Administered 2020-10-04: 4 mg via INTRAVENOUS

## 2020-10-04 MED ORDER — SODIUM CHLORIDE 3 % IN NEBU
4.0000 mL | INHALATION_SOLUTION | Freq: Every day | RESPIRATORY_TRACT | Status: DC
  Filled 2020-10-04 (×2): qty 4

## 2020-10-04 MED ORDER — EPINEPHRINE HCL (NASAL) 0.1 % NA SOLN
NASAL | Status: AC
  Filled 2020-10-04: qty 30

## 2020-10-04 MED ORDER — ONDANSETRON HCL 4 MG/2ML IJ SOLN
INTRAMUSCULAR | Status: AC
  Filled 2020-10-04: qty 2

## 2020-10-04 MED ORDER — PROPOFOL 10 MG/ML IV BOLUS
INTRAVENOUS | Status: AC
  Filled 2020-10-04: qty 40

## 2020-10-04 MED ORDER — PHENYLEPHRINE HCL-NACL 10-0.9 MG/250ML-% IV SOLN
INTRAVENOUS | Status: DC | PRN
  Administered 2020-10-04: 25 ug/min via INTRAVENOUS

## 2020-10-04 MED ORDER — SODIUM CHLORIDE 3 % IN NEBU
4.0000 mL | INHALATION_SOLUTION | Freq: Every day | RESPIRATORY_TRACT | Status: AC
  Administered 2020-10-04 – 2020-10-06 (×3): 4 mL via RESPIRATORY_TRACT
  Filled 2020-10-04 (×3): qty 4

## 2020-10-04 MED ORDER — OXYMETAZOLINE HCL 0.05 % NA SOLN
NASAL | Status: AC
  Filled 2020-10-04: qty 30

## 2020-10-04 MED ORDER — DEXAMETHASONE SODIUM PHOSPHATE 10 MG/ML IJ SOLN
INTRAMUSCULAR | Status: AC
  Filled 2020-10-04: qty 1

## 2020-10-04 MED ORDER — FENTANYL CITRATE (PF) 250 MCG/5ML IJ SOLN
INTRAMUSCULAR | Status: DC | PRN
  Administered 2020-10-04: 50 ug via INTRAVENOUS

## 2020-10-04 SURGICAL SUPPLY — 32 items
BALLN PULM 15 16.5 18X75 (BALLOONS)
BALLOON PULM 15 16.5 18X75 (BALLOONS) IMPLANT
CANISTER SUCT 3000ML PPV (MISCELLANEOUS) ×2 IMPLANT
CNTNR URN SCR LID CUP LEK RST (MISCELLANEOUS) IMPLANT
CONT SPEC 4OZ STRL OR WHT (MISCELLANEOUS)
COVER BACK TABLE 60X90IN (DRAPES) ×2 IMPLANT
COVER MAYO STAND STRL (DRAPES) ×2 IMPLANT
COVER WAND RF STERILE (DRAPES) ×2 IMPLANT
DRAPE HALF SHEET 40X57 (DRAPES) ×2 IMPLANT
GAUZE 4X4 16PLY RFD (DISPOSABLE) ×2 IMPLANT
GLOVE BIO SURGEON STRL SZ 6.5 (GLOVE) ×2 IMPLANT
GOWN STRL REUS W/ TWL LRG LVL3 (GOWN DISPOSABLE) IMPLANT
GOWN STRL REUS W/TWL LRG LVL3 (GOWN DISPOSABLE)
GUARD TEETH (MISCELLANEOUS) ×2 IMPLANT
HEMOSTAT ARISTA ABSORB 3G PWDR (HEMOSTASIS) ×1 IMPLANT
KIT TURNOVER KIT B (KITS) ×2 IMPLANT
NDL HYPO 25GX1X1/2 BEV (NEEDLE) IMPLANT
NDL TRANS ORAL INJECTION (NEEDLE) IMPLANT
NEEDLE HYPO 25GX1X1/2 BEV (NEEDLE) IMPLANT
NEEDLE TRANS ORAL INJECTION (NEEDLE) IMPLANT
NS IRRIG 1000ML POUR BTL (IV SOLUTION) ×2 IMPLANT
PACK SURGICAL SETUP 50X90 (CUSTOM PROCEDURE TRAY) ×1 IMPLANT
PAD ARMBOARD 7.5X6 YLW CONV (MISCELLANEOUS) ×4 IMPLANT
PATTIES SURGICAL .5 X3 (DISPOSABLE) IMPLANT
PATTIES SURGICAL .5X1.5 (GAUZE/BANDAGES/DRESSINGS) ×1 IMPLANT
POSITIONER HEAD DONUT 9IN (MISCELLANEOUS) IMPLANT
SOL ANTI FOG 6CC (MISCELLANEOUS) IMPLANT
SOLUTION ANTI FOG 6CC (MISCELLANEOUS) ×1
SURGILUBE 2OZ TUBE FLIPTOP (MISCELLANEOUS) IMPLANT
SYR BULB IRRIG 60ML STRL (SYRINGE) ×1 IMPLANT
TOWEL GREEN STERILE FF (TOWEL DISPOSABLE) ×4 IMPLANT
TUBE CONNECTING 12X1/4 (SUCTIONS) ×2 IMPLANT

## 2020-10-04 NOTE — Transfer of Care (Signed)
Immediate Anesthesia Transfer of Care Note  Patient: Devin [name]  Procedure(s) Performed: DIRECT LARYNGOSCOPY, PHARYNGOSCOPY WITH BIOPSY (N/A Mouth) ESOPHAGOSCOPY WITH BIOPSY (N/A Mouth)  Patient Location: PACU  Anesthesia Type:General  Level of Consciousness: oriented, drowsy and patient cooperative  Airway & Oxygen Therapy: Patient placed on Ventilator (see vital sign flow sheet for setting)  Post-op Assessment: Report given to RN and Post -op Vital signs reviewed and stable  Post vital signs: Reviewed and stable  Last Vitals:  Vitals Value Taken Time  BP 136/59 10/04/20 1913  Temp    Pulse 77 10/04/20 1916  Resp 17 10/04/20 1916  SpO2 100 % 10/04/20 1916  Vitals shown include unvalidated device data.  Last Pain:  Vitals:   10/04/20 1514  TempSrc:   PainSc: 0-No pain      Patients Stated Pain Goal: 2 (88/30/14 1597)  Complications: No complications documented.

## 2020-10-04 NOTE — Plan of Care (Signed)
°  Problem: Coping: °Goal: Level of anxiety will decrease °Outcome: Progressing °  °

## 2020-10-04 NOTE — Progress Notes (Signed)
Patient placed on pressure support 40%.  Tolerating well.  Will continue to monitor.

## 2020-10-04 NOTE — Progress Notes (Signed)
PROGRESS NOTE  Devin [name]  DOB: [birth year]  PCP: Prince Solian, MD IRJ:[id number]  DOA: 09/10/2020  LOS: 24 days   Chief Complaint  Patient presents with  . Back Pain  . Vomiting   Brief narrative: Devin [name] is a 80 y.o. male with PMH significant for DM2, HTN, A. 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  On 3/31, patient fell while shopping at a bookstore.  No head trauma or LOC but had a new onset back pain.  4/1, seen by PCP and underwent thoracic, lumbar, and chest x-ray. Found to have new T11 Chance fracture.He also has a stable L1 compression fracture, extensive lumbar spondylosis and postsurgical changes. Additionally hada left seventh rib fracture anteriorly and a left pleural effusion without any obvious pneumothorax.  Pain control was tried as an outpatient with hydrocodone and Robaxin but it became intolerable.  Additionally, he was not able to use his incentive spirometer because of chest pain from rib fracture.  He continued to have progressive shortness of breath and hence presented to ED on 4/3 with excruciating pain, agitation, shortness of breath.  In the ED, patient required intubation and admitted to ICU. Seen by neurosurgery.  Significant Hospital events  4/8, neurosurgery did laking of the prior rod systems (T7-11 and T11-L4 rods) across T11 fracture.  4/9- failed attempt extubation. Decadron X 4 doses for airway edema   4/10 IVFs increased for mild Cr bump  4/11 Failed extubation 2/2 progressive weakness, worse cough mechanics (essentially just fatigued and progressed to respiratory failure) re-intubated. Sputum sent post re-intubation   4/12 trach at bedside.   4/13 sputum grew Klebsiella. Low gd temp/ongoing leukocytosis so Ceftriaxone started. GI consulted for  diarrhea in context of known chron's   4/14-4/18 -gradually transition to trach collar  4/20 transferred out to hospitalist service  4/21-insurance denied CIR transfer  4/22 failed barium swallow evaluation  4/23-CT scan of neck soft tissue was obtained which raised suspicion of laryngeal carcinoma.  Subjective: Patient was seen and examined this morning.  Lying down in bed.  He has been getting thick secretions through his tracheostomy tube since yesterday.  Respiratory therapy involved. It came to our attention that he is planned for laryngoscopy and biopsy this afternoon at 130 but he still has tube feeding running.  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  Reviewed extubation trials in ICU.  Underwent tracheostomy on 4/12 -Currently on 5 L oxygen by tracheostomy tube -Continue tracheostomy care.  Continue frequent suctioning. -Because of worsening secretions, I have started him on Robitussin/DM syrup, Mucomyst, albuterol, hypertonic saline and chest PT.  Klebsiella pneumonia Small loculated left effusion -Completed treatment with IV ceftriaxone -Continue yupelri and Pulmicort  Dysphagia -Currently has tube feeding through Dobbhoff.  Speech therapy following.    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  Per speech therapy note, he seems to have a thickening of the prevertebral tissue at the level of C4-5.   -4/23, CT scan soft tissue neck was obtained which raised suspicion of laryngeal carcinoma.  Seen by ENT Dr. Fredric Dine on 4/25.  Underwent bedside laryngoscopy.   -It is unclear to me if patient can have biopsy done today because he still has ongoing tube feeding.  He needs to stay n.p.o. after midnight if  biopsy is planned the next day. -Eliquis last dose on the morning of  4/24  Acute anemia of critical illness -Based hemoglobin more than 11. -Hemoglobin running in low range between 7-8 now.  No active bleeding. Recent Labs    09/28/20 0019 09/30/20 0324 10/01/20 0346 10/02/20 0748 10/03/20 0154  HGB 9.8* 8.5* 8.4* 7.7* 7.8*  MCV 97.5 96.2 95.3 94.3 94.0  VITAMINB12  --   --  397  --   --   FOLATE  --   --  11.6  --   --   FERRITIN  --   --  119  --   --   TIBC  --   --  349  --   --   IRON  --   --  26*  --   --   RETICCTPCT  --   --  2.5  --   --     Acute kidney injury  -Likely due to aggressive diuresis and diarrhea -Creatinine has normalized now. -Diuretics on hold. -On free water 200 mL every 6 hours through G-tube. -Closely watch fluid balance Recent Labs    09/22/20 0317 09/23/20 0153 09/24/20 0328 09/25/20 0445 09/26/20 0337 09/28/20 0019 09/30/20 0324 10/01/20 0346 10/02/20 0748 10/03/20 0154  BUN 24* 34* 42* 46* 50* 50* 51* 50* 48* 47*  CREATININE 1.02 1.12 1.11 1.28* 1.22 1.10 1.16 1.15 1.13 1.20   Paroxysmal Afib  -Was in RVR intermittently in the hospital. -Currently on normal sinus rhythm on atenolol and diltiazem. -Eliquis remains on hold.  Essential hypertension -Blood pressure controlled on atenolol and diltiazem.  Diuretics on hold.  Type 2 diabetes mellitus -A1c 6.4 on 4/3. -Currently blood sugars controlled on Lantus 12 units twice daily.   -Continue sliding scale insulin with Accu-Cheks Recent Labs  Lab 10/03/20 1619 10/03/20 2051 10/03/20 2331 10/04/20 0427 10/04/20 0834  GLUCAP 122* 180* 173* 159* 175*   Acute diarrhea History of Crohn's disease -Per patient's wife prior to admission, patient was on mesalamine 1600 mg twice daily and as needed loperamide.  Patient is currently not able to get mesalamine because it cannot be crushed.  -He is also on liquid/tube feeding probably making diarrhea worse. Currently on Imodium scheduled 3 times daily and fiber. Continues to have copious amount of  diarrhea. I would increase Imodium to 4 times a day.   T11-T12 Chance fracture -Status postsurgical stabilization by neurosurgery -Continue PT/OT  History of ankylosing spondylitis, kyphoscoliosis -Patient's wife, patient was physically functional prior to this hospitalization.  Continue PT/OT  History of adenocarcinoma s/p LUL lobectomy (2016) History of thyroid and prostate cancer -Continue outpatient follow-up with pulmonology.  Hypothyroidism Continue Synthroid   Impaired mobility -PT eval obtained.  CIR recommended.  But it was denied by his insurance.    Mobility: Needs continued therapy while in the hospital to prevent deconditioning. Code Status:   Code Status: Full Code  Nutritional status: Body mass index is 26.78 kg/m. Nutrition Problem: Inadequate oral intake Etiology: inability to eat Signs/Symptoms: NPO status Diet Order            Diet NPO time specified  Diet effective now                 DVT prophylaxis: SCD's Start: 10/04/20 0816 SCD's Start: 09/15/20 1734   Antimicrobials:  None Fluid: Free water through tube feeding.  Not on IV fluid. Consultants: ENT Family Communication:  Not at bedside  Status is: Inpatient  Remains inpatient appropriate because:  New tracheostomy, has feeding through Dobhoff.  Needs laryngeal biopsy by ENT  Dispo: The patient is from: Home              Anticipated d/c is to: CIR if authorized by insurance.  Not medically stable for next several days              Patient currently is not medically stable to d/c.   Difficult to place patient No    Infusions:  . sodium chloride 250 mL (09/23/20 1109)  . feeding supplement (OSMOLITE 1.5 CAL) 1,000 mL (10/03/20 1718)    Scheduled Meds: . acetaminophen (TYLENOL) oral liquid 160 mg/5 mL  500 mg Per Tube Q6H  . acetylcysteine  600 mg Nebulization BID  . atenolol  25 mg Per NG tube BID  . bethanechol  5 mg Per NG tube QID  . budesonide (PULMICORT) nebulizer solution  0.5  mg Nebulization BID  . busPIRone  15 mg Per Tube BID  . chlorhexidine  15 mL Mouth/Throat BID  . cholestyramine  4 g Per Tube BID AC  . diltiazem  30 mg Per Tube Q6H  . feeding supplement (PROSource TF)  45 mL Per Tube TID  . fiber  1 packet Per Tube BID  . free water  200 mL Per Tube Q6H  . Gerhardt's butt cream   Topical BID  . guaiFENesin-dextromethorphan  5 mL Per Tube Q8H  . insulin aspart  0-20 Units Subcutaneous Q4H  . insulin glargine  12 Units Subcutaneous BID  . levothyroxine  125 mcg Per Tube QAC breakfast  . loperamide HCl  4 mg Per Tube QID  . mouth rinse  15 mL Mouth Rinse 10 times per day  . nutrition supplement (JUVEN)  1 packet Per Tube BID BM  . revefenacin  175 mcg Nebulization Daily  . sodium chloride HYPERTONIC  4 mL Nebulization Daily    Antimicrobials: Anti-infectives (From admission, onward)   Start     Dose/Rate Route Frequency Ordered Stop   09/20/20 1000  cefTRIAXone (ROCEPHIN) 2 g in sodium chloride 0.9 % 100 mL IVPB        2 g 200 mL/hr over 30 Minutes Intravenous Every 24 hours 09/20/20 0821 09/25/20 0755   09/15/20 1830  ceFAZolin (ANCEF) IVPB 2g/100 mL premix        2 g 200 mL/hr over 30 Minutes Intravenous Every 8 hours 09/15/20 1733 09/16/20 0232   09/15/20 1616  vancomycin (VANCOCIN) powder  Status:  Discontinued          As needed 09/15/20 1616 09/15/20 1648   09/15/20 0800  ceFAZolin (ANCEF) IVPB 2g/100 mL premix        2 g 200 mL/hr over 30 Minutes Intravenous To Short Stay 09/15/20 0704 09/15/20 1515   09/10/20 1300  ceFEPIme (MAXIPIME) 2 g in sodium chloride 0.9 % 100 mL IVPB  Status:  Discontinued        2 g 200 mL/hr over 30 Minutes Intravenous  Once 09/10/20 1245 09/10/20 1403   09/10/20 1300  metroNIDAZOLE (FLAGYL) IVPB 500 mg  Status:  Discontinued        500 mg 100 mL/hr over 60 Minutes Intravenous  Once 09/10/20 1245 09/10/20 1403   09/10/20 1300  vancomycin (VANCOCIN) IVPB 1000 mg/200 mL premix  Status:  Discontinued         1,000 mg 200 mL/hr over 60 Minutes Intravenous  Once 09/10/20 1245 09/10/20 1415      PRN meds: sodium  chloride, acetaminophen **OR** acetaminophen, albuterol, ALPRAZolam, diphenhydrAMINE, loperamide HCl, ondansetron (ZOFRAN) IV, oxyCODONE, phenol   Objective: Vitals:   10/04/20 0423 10/04/20 0816  BP: (!) 123/56 107/60  Pulse: 66 62  Resp: (!) 24 (!) 22  Temp: 97.6 F (36.4 C) 97.9 F (36.6 C)  SpO2: 97% 96%    Intake/Output Summary (Last 24 hours) at 10/04/2020 1121 Last data filed at 10/04/2020 0423 Gross per 24 hour  Intake --  Output 1400 ml  Net -1400 ml   Filed Weights   09/27/20 0500 09/28/20 0226 09/29/20 0500  Weight: 82.8 kg 82.8 kg 82.3 kg   Weight change:  Body mass index is 26.78 kg/m.   Physical Exam: General exam: Pleasant, elderly Caucasian male.  Not in distress Skin: No rashes, lesions or ulcers. HEENT: Atraumatic, normocephalic, no obvious bleeding.  Has tracheostomy tube anteriorly, making thick secretions.  Tube feeding through Dobbhoff. Lungs: Clear to auscultation bilaterally CVS: Regular rate and rhythm, no murmur GI/Abd soft, mild generalized tenderness with Crohn's disease, bowel sound present CNS: Nods his head and moves lips to answer questions.  Alert, awake, oriented x3 Psychiatry: Depressed look Extremities: No pedal edema, no calf tenderness  Data Review: I have personally reviewed the laboratory data and studies available.  Recent Labs  Lab 09/28/20 0019 09/30/20 0324 10/01/20 0346 10/02/20 0748 10/03/20 0154  WBC 11.7* 13.1* 12.3* 12.3* 13.0*  NEUTROABS 9.9* 10.6* 9.9* 10.0* 10.8*  HGB 9.8* 8.5* 8.4* 7.7* 7.8*  HCT 31.5* 27.6* 26.6* 24.9* 24.9*  MCV 97.5 96.2 95.3 94.3 94.0  PLT 400 380 320 378 400   Recent Labs  Lab 09/28/20 0019 09/30/20 0324 10/01/20 0346 10/02/20 0748 10/03/20 0154 10/03/20 1511  NA 136 135 133* 130* 129*  --   K 5.0 5.1 5.2* 5.5* 5.1  --   CL 94* 92* 94* 92* 89*  --   CO2 36* 36* 30 33* 32   --   GLUCOSE 204* 243* 206* 188* 183*  --   BUN 50* 51* 50* 48* 47*  --   CREATININE 1.10 1.16 1.15 1.13 1.20  --   CALCIUM 8.7* 8.8* 8.5* 8.6* 8.6*  --   MG 2.0  --   --   --   --  2.1  PHOS 3.2  --   --   --   --  3.7    F/u labs ordered Unresulted Labs (From admission, onward)          Start     Ordered   10/05/20 0500  CBC with Differential/Platelet  Daily,   R     Question:  Specimen collection method  Answer:  Lab=Lab collect   10/04/20 0800   10/05/20 7124  Basic metabolic panel  Daily,   R     Question:  Specimen collection method  Answer:  Lab=Lab collect   10/04/20 0800          Signed, Terrilee Croak, MD [facility] 10/04/2020

## 2020-10-04 NOTE — Op Note (Signed)
OPERATIVE NOTE  Devin [name] Date/Time of Admission: 09/10/2020  9:46 AM  CSN: [id number];[id number] Attending Provider: Terrilee Croak, MD Room/Bed: MCPO/NONE DOB: Mar 07, 1941 Age: 80 y.o.   Pre-Op Diagnosis: mass lesion  Post-Op Diagnosis: mass lesion  Procedure: Procedure(s): DIRECT LARYNGOSCOPY, PHARYNGOSCOPY WITH BIOPSY ESOPHAGOSCOPY WITH BIOPSY  Anesthesia: General  Surgeon(s): Del [place], DO  Staff: Circulator: Hal Morales, RN Scrub Person: Zannie Kehr, RN  Implants: * No implants in log *  Specimens: ID Type Source Tests Collected by Time Destination  1 : Right Posterior Pharngeal Wall Tissue PATH ENT biopsy SURGICAL PATHOLOGY Darrall Strey A, DO 11/22/3792 [id number]     Complications: None  EBL: 5 ML  Condition: stable  Operative Findings:  Fullness of the right aspect of the posterior pharyngeal wall without discreet mass lesion. Right pyriform recess appeared normal. Mucosa overlying right posterior pharyngeal wall appeared relatively normal, small areas of blanched mucosa without overlying leukoplakia. Mucosal redundancy of bilateral arytenoids and post-cricoid area with no mass lesion palpated. True vocal folds appeared edematous but with no mucosal abnormality.   Description of Operation: Once operative consent was obtained, and the surgical site confirmed with the operating room team, the patient was brought back to the operating room and general anesthesia was obtained via patient's existing tracheostomy. The patient was turned over to the ENT service. An operating laryngoscope was used to directly visualize the upper airway and glottis. All anatomic areas from the oral cavity to the glottis were examined and noted to be normal with only exceptions noted in this report. Areas examined included the oropharynx, vallecula, both surfaces of the epiglottis, glottis, post cricoid region and bilateral pyriform sinuses.   The patient was placed  in laryngeal suspension with the focus on the glottis. Operative photos were obtained.   Several biopsies of the right aspect of the posterior pharyngeal wall where the fullness was noted were obtained. Bleeding was controlled with Adrenaline-soaked pledgets.   The cervical esophagoscope was then inserted carefully through the esophageal introitus and advanced slowly. The esophagoscope was then withdrawn slowly to allow visualization of the esophageal mucosa. The esophageal mucosa appeared normal.  An oral gastric tube was placed into the stomach and suctioned to reduce postoperative nausea. The patient was turned back over to the anesthesia service. The patient was transferred to the PACU in stable condition.    [name], [facility]  10/04/2020

## 2020-10-04 NOTE — Anesthesia Procedure Notes (Signed)
Date/Time: 10/04/2020 5:53 PM Performed by: Candis Shine, CRNA Pre-anesthesia Checklist: Patient identified, Emergency Drugs available, Suction available and Patient being monitored Patient Re-evaluated:Patient Re-evaluated prior to induction Oxygen Delivery Method: Circle System Utilized Preoxygenation: Pre-oxygenation with 100% oxygen Induction Type: IV induction and Tracheostomy Number of attempts: 1 Airway Equipment and Method: Tracheostomy Placement Confirmation: positive ETCO2 and breath sounds checked- equal and bilateral Tube secured with: Tape Dental Injury: Teeth and Oropharynx as per pre-operative assessment  Comments: Pt with existing cuffed #6 shiley trach.

## 2020-10-04 NOTE — Progress Notes (Signed)
SLP Cancellation Note  Patient Details Name: Devin [name] MRN: [id number] DOB: [birth year]   Cancelled treatment:       Reason Eval/Treat Not Completed: Medical issues which prohibited therapy- pt not appropriate for PMV today - pt with increased secretions; desaturating; RN and RT present.   [name] 10/04/2020, 10:51 AM

## 2020-10-04 NOTE — Interval H&P Note (Signed)
History and Physical Interval Note:  10/04/2020 1:11 PM  Devin [name]  has presented today for surgery, with the diagnosis of Thoracic fracture.  The various methods of treatment have been discussed with the patient and family. After consideration of risks, benefits and other options for treatment, the patient has consented to  PROCEDURE: Direct laryngoscopy, esophagoscopy with biopsy as a surgical intervention.  The patient's history has been reviewed, patient examined, no change in status, stable for surgery.  I have reviewed the patient's chart and labs.  Nursing failed to hold tube feeds as per order, case postponed until this evening. Questions were answered to the patient's satisfaction.     Armaan Pond A Demarus Latterell

## 2020-10-04 NOTE — Progress Notes (Signed)
RT started chest physiotherapy on patient. He was only able to tolerate 2 minutes before wanting me to take it off. He motioned it was making him feel sick. RT cut it off and continued his breathing treatment.

## 2020-10-04 NOTE — Progress Notes (Signed)
Patient to PACU bay 8 on ventilator PRVC 60/15/5.  Patient responds to voice and follows commands appropriately.  Patient denies pain.  VSS.  Plan for RT to place patient on PS then hopefully trach collar.  Will continue to monitor.

## 2020-10-04 NOTE — Progress Notes (Signed)
Placed patient trach HME with oxygen bled in at 6lpm with Sp02=100%. Suctioned moderate amount of thick yellow secretions from trach. Patient transported to [place] progressive room 12.

## 2020-10-05 ENCOUNTER — Encounter (HOSPITAL_COMMUNITY): Payer: Self-pay | Admitting: Otolaryngology

## 2020-10-05 DIAGNOSIS — J9622 Acute and chronic respiratory failure with hypercapnia: Secondary | ICD-10-CM | POA: Diagnosis not present

## 2020-10-05 DIAGNOSIS — S271XXA Traumatic hemothorax, initial encounter: Secondary | ICD-10-CM | POA: Diagnosis not present

## 2020-10-05 DIAGNOSIS — J150 Pneumonia due to Klebsiella pneumoniae: Secondary | ICD-10-CM | POA: Diagnosis not present

## 2020-10-05 DIAGNOSIS — J9621 Acute and chronic respiratory failure with hypoxia: Secondary | ICD-10-CM | POA: Diagnosis not present

## 2020-10-05 LAB — CBC WITH DIFFERENTIAL/PLATELET
Abs Immature Granulocytes: 0.15 10*3/uL — ABNORMAL HIGH (ref 0.00–0.07)
Basophils Absolute: 0 10*3/uL (ref 0.0–0.1)
Basophils Relative: 0 %
Eosinophils Absolute: 0 10*3/uL (ref 0.0–0.5)
Eosinophils Relative: 0 %
HCT: 26.5 % — ABNORMAL LOW (ref 39.0–52.0)
Hemoglobin: 8.2 g/dL — ABNORMAL LOW (ref 13.0–17.0)
Immature Granulocytes: 1 %
Lymphocytes Relative: 3 %
Lymphs Abs: 0.5 10*3/uL — ABNORMAL LOW (ref 0.7–4.0)
MCH: 29.5 pg (ref 26.0–34.0)
MCHC: 30.9 g/dL (ref 30.0–36.0)
MCV: 95.3 fL (ref 80.0–100.0)
Monocytes Absolute: 0.2 10*3/uL (ref 0.1–1.0)
Monocytes Relative: 1 %
Neutro Abs: 15 10*3/uL — ABNORMAL HIGH (ref 1.7–7.7)
Neutrophils Relative %: 95 %
Platelets: 499 10*3/uL — ABNORMAL HIGH (ref 150–400)
RBC: 2.78 MIL/uL — ABNORMAL LOW (ref 4.22–5.81)
RDW: 13.5 % (ref 11.5–15.5)
WBC: 15.8 10*3/uL — ABNORMAL HIGH (ref 4.0–10.5)
nRBC: 0 % (ref 0.0–0.2)

## 2020-10-05 LAB — GLUCOSE, CAPILLARY
Glucose-Capillary: 110 mg/dL — ABNORMAL HIGH (ref 70–99)
Glucose-Capillary: 138 mg/dL — ABNORMAL HIGH (ref 70–99)
Glucose-Capillary: 256 mg/dL — ABNORMAL HIGH (ref 70–99)
Glucose-Capillary: 291 mg/dL — ABNORMAL HIGH (ref 70–99)
Glucose-Capillary: 305 mg/dL — ABNORMAL HIGH (ref 70–99)
Glucose-Capillary: 98 mg/dL (ref 70–99)

## 2020-10-05 LAB — BASIC METABOLIC PANEL
Anion gap: 8 (ref 5–15)
Anion gap: 7 (ref 5–15)
Anion gap: 8 (ref 5–15)
BUN: 56 mg/dL — ABNORMAL HIGH (ref 8–23)
BUN: 60 mg/dL — ABNORMAL HIGH (ref 8–23)
BUN: 66 mg/dL — ABNORMAL HIGH (ref 8–23)
CO2: 31 mmol/L (ref 22–32)
CO2: 32 mmol/L (ref 22–32)
CO2: 36 mmol/L — ABNORMAL HIGH (ref 22–32)
Calcium: 8.5 mg/dL — ABNORMAL LOW (ref 8.9–10.3)
Calcium: 8.5 mg/dL — ABNORMAL LOW (ref 8.9–10.3)
Calcium: 8.5 mg/dL — ABNORMAL LOW (ref 8.9–10.3)
Chloride: 92 mmol/L — ABNORMAL LOW (ref 98–111)
Chloride: 91 mmol/L — ABNORMAL LOW (ref 98–111)
Chloride: 89 mmol/L — ABNORMAL LOW (ref 98–111)
Creatinine, Ser: 1.34 mg/dL — ABNORMAL HIGH (ref 0.61–1.24)
Creatinine, Ser: 1.44 mg/dL — ABNORMAL HIGH (ref 0.61–1.24)
Creatinine, Ser: 1.49 mg/dL — ABNORMAL HIGH (ref 0.61–1.24)
GFR, Estimated: 55 mL/min — ABNORMAL LOW (ref >60)
GFR, Estimated: 50 mL/min — ABNORMAL LOW (ref >60)
GFR, Estimated: 48 mL/min — ABNORMAL LOW (ref >60)
Glucose, Bld: 235 mg/dL — ABNORMAL HIGH (ref 70–99)
Glucose, Bld: 360 mg/dL — ABNORMAL HIGH (ref 70–99)
Glucose, Bld: 269 mg/dL — ABNORMAL HIGH (ref 70–99)
Potassium: 6.2 mmol/L — ABNORMAL HIGH (ref 3.5–5.1)
Potassium: 7 mmol/L (ref 3.5–5.1)
Potassium: 6.3 mmol/L (ref 3.5–5.1)
Sodium: 131 mmol/L — ABNORMAL LOW (ref 135–145)
Sodium: 130 mmol/L — ABNORMAL LOW (ref 135–145)
Sodium: 133 mmol/L — ABNORMAL LOW (ref 135–145)

## 2020-10-05 LAB — POTASSIUM: Potassium: 5.4 mmol/L — ABNORMAL HIGH (ref 3.5–5.1)

## 2020-10-05 MED ORDER — SODIUM ZIRCONIUM CYCLOSILICATE 10 G PO PACK
10.0000 g | PACK | Freq: Once | ORAL | Status: AC
  Administered 2020-10-05: 10 g via ORAL
  Filled 2020-10-05: qty 1

## 2020-10-05 MED ORDER — FUROSEMIDE 10 MG/ML IJ SOLN
60.0000 mg | Freq: Once | INTRAMUSCULAR | Status: AC
  Administered 2020-10-05: 60 mg via INTRAVENOUS
  Filled 2020-10-05: qty 6

## 2020-10-05 MED ORDER — CALCIUM GLUCONATE-NACL 1-0.675 GM/50ML-% IV SOLN
1.0000 g | Freq: Once | INTRAVENOUS | Status: AC
  Administered 2020-10-05: 1000 mg via INTRAVENOUS
  Filled 2020-10-05: qty 50

## 2020-10-05 MED ORDER — INSULIN ASPART 100 UNIT/ML IV SOLN
10.0000 [IU] | Freq: Once | INTRAVENOUS | Status: AC
  Administered 2020-10-05: 10 [IU] via INTRAVENOUS

## 2020-10-05 MED ORDER — SODIUM POLYSTYRENE SULFONATE 15 GM/60ML PO SUSP
30.0000 g | Freq: Once | ORAL | Status: AC
  Administered 2020-10-05: 30 g
  Filled 2020-10-05: qty 120

## 2020-10-05 MED ORDER — NEPRO/CARBSTEADY PO LIQD
1000.0000 mL | ORAL | Status: DC
  Administered 2020-10-05: 1000 mL
  Filled 2020-10-05 (×2): qty 1000

## 2020-10-05 MED ORDER — PROSOURCE TF PO LIQD
45.0000 mL | Freq: Every day | ORAL | Status: DC
  Administered 2020-10-06: 45 mL
  Filled 2020-10-05: qty 45

## 2020-10-05 MED ORDER — SODIUM BICARBONATE 8.4 % IV SOLN
50.0000 meq | Freq: Once | INTRAVENOUS | Status: AC
  Administered 2020-10-05: 50 meq via INTRAVENOUS
  Filled 2020-10-05: qty 50

## 2020-10-05 NOTE — Progress Notes (Signed)
Physical Therapy Treatment Patient Details Name: Devin [name] MRN: [id number] DOB: Mar 30, 1941 Today's Date: 10/05/2020    History of Present Illness 80 yo male who presented 4/3 s/p fell at a bookstore 3/31 and obtained T11 chance fracture, stable L1 compression fracture, extensive lumbar spondylosis and postsurgical changes. Additionally had left 7th rib fx anteriorly and left pleural effusion. Pt intubated in ER on 4/3 for agitation and hypoxemia. 4/8 s/p rod connection for T11 fx. 4/9 failed attempt extubation.  4/12 trach placement intubated 4/3.  PMH restrictive lung disease, emphysema/COPD adenocarcinoma s/p LUL lobectomy Crohn's, Psoriasis, Afib, nephrolithiasis, HTN, DM, obesity, thyroid cancer s/p RAI and total thyroidectomy (1999), and prostate cancer s/p prostatectomy (2012) but with recurrence    PT Comments    The pt was able to make great progress with mobility and PT goals this session, progressing distance walked with multiple short bouts of ambulation in the room this session. The pt continues to require assist for bed mobility and to complete power up and hand positioning for sit-stand, but was able to complete x2 bouts of ambulation with RW and minA. The pt reports significant fatigue and SOB after each bout, requiring return to trach collar and subsequent increase in O2 (from 35% FiO2 and 8L to 40% FiO2 and 10L). The pt fatigues after short bouts, but was able to complete multiple bouts of mobility this session and will continue to benefit from progression of endurance and strength with skilled PT. Continue to recommend CIR level rehab.     Follow Up Recommendations  CIR     Equipment Recommendations  3in1 (PT);Rolling walker with 5" wheels    Recommendations for Other Services       Precautions / Restrictions Precautions Precautions: Fall;Other (comment);Back Precaution Booklet Issued: No Precaution Comments: rectal tube, trach collar, cortrak Required Braces or  Orthoses: Spinal Brace Spinal Brace: Thoracolumbosacral orthotic;Applied in sitting position Restrictions Weight Bearing Restrictions: No    Mobility  Bed Mobility Overal bed mobility: Needs Assistance Bed Mobility: Rolling;Sidelying to Sit Rolling: Min assist Sidelying to sit: Min assist;HOB elevated       General bed mobility comments: Min A for elevating trunk    Transfers Overall transfer level: Needs assistance Equipment used: Rolling walker (2 wheeled) Transfers: Sit to/from Stand Sit to Stand: Min assist;+2 safety/equipment         General transfer comment: min +2 for rise, steady, and lines/leads. Pt with posterior bias in standing, corrected with continued standing  Ambulation/Gait Ambulation/Gait assistance: Min assist;+2 safety/equipment Gait Distance (Feet): 12 Feet (+ 17f) Assistive device: Rolling walker (2 wheeled) Gait Pattern/deviations: Step-through pattern;Decreased stride length;Shuffle;Trunk flexed;Narrow base of support Gait velocity: decr Gait velocity interpretation: <1.31 ft/sec, indicative of household ambulator General Gait Details: minA at times to steady, cues for positioning in RW and max encouragement. low SpO2 (to 70s) with gait, improved with increase in O2 from 35% and 8L to 40% and 10L. cues for posture and positioning in RW       Balance Overall balance assessment: Needs assistance Sitting-balance support: Bilateral upper extremity supported;Feet supported;Single extremity supported Sitting balance-Leahy Scale: Fair Sitting balance - Comments: Pt with preference for UE support on bed.   Standing balance support: Bilateral upper extremity supported;During functional activity Standing balance-Leahy Scale: Poor Standing balance comment: Reliant on bil UE support and external assist.                            Cognition Arousal/Alertness:  Awake/alert Behavior During Therapy: WFL for tasks assessed/performed Overall  Cognitive Status: Difficult to assess Area of Impairment: Memory;Following commands                     Memory: Decreased recall of precautions Following Commands: Follows one step commands with increased time       General Comments: Requiring increased time. Fatigues quickly and then can become flat.      Exercises      General Comments General comments (skin integrity, edema, etc.): Wife and daughter present during session. SpO2 dropping to 85% (poor pleth) on RA. Placed back on 8L at 35% and pt quickly returning to 90s. however, pt presenting with fatigue and feeling SOB, elevating O2 concentration to 40% and 10L for 2nd bout of ambulation. pt continues to report fatigue and SOB but recovered to90s quickly with seated rest. .      Pertinent Vitals/Pain Pain Assessment: Faces Faces Pain Scale: Hurts little more Pain Location: back moving stand>sit Pain Descriptors / Indicators: Sore;Grimacing;Discomfort Pain Intervention(s): Limited activity within patient's tolerance;Monitored during session;Repositioned;Patient requesting pain meds-RN notified           PT Goals (current goals can now be found in the care plan section) Acute Rehab PT Goals Patient Stated Goal: to walk PT Goal Formulation: With patient/family Time For Goal Achievement: 10/19/20 Potential to Achieve Goals: Good Progress towards PT goals: Progressing toward goals    Frequency    Min 5X/week      PT Plan Current plan remains appropriate    Co-evaluation PT/OT/SLP Co-Evaluation/Treatment: Yes Reason for Co-Treatment: For patient/therapist safety;To address functional/ADL transfers PT goals addressed during session: Mobility/safety with mobility;Balance;Strengthening/ROM OT goals addressed during session: ADL's and self-care      AM-PAC PT "6 Clicks" Mobility   Outcome Measure  Help needed turning from your back to your side while in a flat bed without using bedrails?: A Little Help  needed moving from lying on your back to sitting on the side of a flat bed without using bedrails?: A Little Help needed moving to and from a bed to a chair (including a wheelchair)?: A Little Help needed standing up from a chair using your arms (e.g., wheelchair or bedside chair)?: A Little Help needed to walk in hospital room?: A Little Help needed climbing 3-5 steps with a railing? : A Lot 6 Click Score: 17    End of Session Equipment Utilized During Treatment: Oxygen;Back brace Activity Tolerance: Patient tolerated treatment well Patient left: with call bell/phone within reach;in chair;with chair alarm set;with family/visitor present Nurse Communication: Mobility status PT Visit Diagnosis: Pain;Difficulty in walking, not elsewhere classified (R26.2);Unsteadiness on feet (R26.81);Other abnormalities of gait and mobility (R26.89);Muscle weakness (generalized) (M62.81) Pain - Right/Left: Right Pain - part of body:  (back)     Time: 5615-3794 PT Time Calculation (min) (ACUTE ONLY): 27 min  Charges:  $Gait Training: 8-22 mins                     Karma Ganja, PT, DPT   Acute Rehabilitation Department Pager #: [phone]   Otho Bellows 10/05/2020, 5:48 PM

## 2020-10-05 NOTE — Progress Notes (Signed)
PROGRESS NOTE  Devin [name]  DOB: 06-25-1940  PCP: Prince Solian, MD BBC:[id number]  DOA: 09/10/2020  LOS: 25 days   Chief Complaint  Patient presents with  . Back Pain  . Vomiting   Brief narrative: Devin [name] is a 80 y.o. male with PMH significant for DM2, HTN, A. 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  On 3/31, patient fell while shopping at a bookstore.  No head trauma or LOC but had a new onset back pain.  4/1, seen by PCP and underwent thoracic, lumbar, and chest x-ray. Found to have new T11 Chance fracture.He also has a stable L1 compression fracture, extensive lumbar spondylosis and postsurgical changes. Additionally hada left seventh rib fracture anteriorly and a left pleural effusion without any obvious pneumothorax.  Pain control was tried as an outpatient with hydrocodone and Robaxin but it became intolerable.  Additionally, he was not able to use his incentive spirometer because of chest pain from rib fracture.  He continued to have progressive shortness of breath and hence presented to ED on 4/3 with excruciating pain, agitation, shortness of breath.  In the ED, patient required intubation and admitted to ICU. Seen by neurosurgery.  Significant Hospital events  4/8, neurosurgery did laking of the prior rod systems (T7-11 and T11-L4 rods) across T11 fracture.  4/9- failed attempt extubation. Decadron X 4 doses for airway edema   4/10 IVFs increased for mild Cr bump  4/11 Failed extubation 2/2 progressive weakness, worse cough mechanics (essentially just fatigued and progressed to respiratory failure) re-intubated. Sputum sent post re-intubation   4/12 trach at bedside.   4/13 sputum grew Klebsiella. Low gd temp/ongoing leukocytosis so Ceftriaxone started. GI consulted for  diarrhea in context of known chron's   4/14-4/18 -gradually transition to trach collar  4/20 transferred out to hospitalist service  4/21-insurance denied CIR transfer  4/22 failed barium swallow evaluation  4/23-CT scan of neck soft tissue was obtained which raised suspicion of laryngeal carcinoma.  4/27, underwent laryngoscopy and biopsy by ENT Dr.Skotnicki in OR.  Subjective: Patient was seen and examined this morning.  Lying on bed.  Not in distress.  Continues to have thick secretions through the tracheostomy tube.  Daughter at bedside. Underwent laryngoscopy and biopsy yesterday in OR. His labs this morning showed elevated potassium level to 6.2 and further up to 7 on repeat check.  Assessment/Plan: ##Acute hyperkalemia -Unclear etiology.  Not on regular potassium supplement.  Not on potassium sparing diuretics. -No EKG changes. -Discussed with nephrologist Dr. Carolin Sicks.  Recommended Kayexalate, IV sodium bicarbonate, insulin/dextrose.  Ordered for the same. -Repeat BMP at noon.  Formal nephrology consultation pending. 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  Eliquis 2 remain on hold because he may need surgical intervention during this hospital stay. 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  Reviewed extubation trials in ICU.  Underwent tracheostomy on 4/12 -Currently on 5 L oxygen by tracheostomy tube -Continue tracheostomy care.  Continue frequent suctioning. -Because of worsening secretions, I have started him on Robitussin/DM syrup, Mucomyst, albuterol, hypertonic saline and chest PT.  Klebsiella pneumonia Small loculated left effusion -Completed treatment  with IV  ceftriaxone -Continue yupelri and Pulmicort  Dysphagia -Currently has tube feeding through Dobbhoff.  Speech therapy following.  -I think once full extent of his potential laryngeal carcinoma status is known, it will be clear if he needs a long-term feeding tube like a PEG tube.  Acute anemia of critical illness -Based hemoglobin more than 11. -Hemoglobin running in low range between 7-8 now.  No active bleeding. Recent Labs    09/30/20 0324 10/01/20 0346 10/02/20 0748 10/03/20 0154 10/05/20 0236  HGB 8.5* 8.4* 7.7* 7.8* 8.2*  MCV 96.2 95.3 94.3 94.0 95.3  VITAMINB12  --  397  --   --   --   FOLATE  --  11.6  --   --   --   FERRITIN  --  119  --   --   --   TIBC  --  349  --   --   --   IRON  --  26*  --   --   --   RETICCTPCT  --  2.5  --   --   --     Acute kidney injury  -Creatinine seems to be worsening in the last few days.  On tube feeding.  Not on regular IV fluid. -Nephrology consulted. Recent Labs    09/24/20 0328 09/25/20 0445 09/26/20 0337 09/28/20 0019 09/30/20 0324 10/01/20 0346 10/02/20 0748 10/03/20 0154 10/05/20 0236 10/05/20 0755  BUN 42* 46* 50* 50* 51* 50* 48* 47* 56* 60*  CREATININE 1.11 1.28* 1.22 1.10 1.16 1.15 1.13 1.20 1.34* 1.44*   Paroxysmal Afib  -Was in RVR intermittently in the hospital. -Currently on normal sinus rhythm on atenolol and diltiazem. -Eliquis remains on hold.  Essential hypertension -Blood pressure controlled on atenolol and diltiazem.  Diuretics on hold.  Type 2 diabetes mellitus -A1c 6.4 on 4/3. -Currently on Lantus 12 units twice daily.  Blood sugar level this morning seems elevated to over 300.  Continue to monitor for next 24 hours.  May need to increase Lantus dose. -Continue sliding scale insulin with Accu-Cheks. Recent Labs  Lab 10/04/20 1239 10/04/20 1908 10/04/20 2346 10/05/20 0306 10/05/20 0747  GLUCAP 130* 78 151* 256* 305*   Acute diarrhea History of Crohn's disease -Per patient's wife  prior to admission, patient was on mesalamine 1600 mg twice daily and as needed loperamide.  Patient is currently not able to get mesalamine because it cannot be crushed.  -He is also on liquid/tube feeding probably making diarrhea worse. Currently on Imodium scheduled 3 times daily and fiber. Continues to have copious amount of diarrhea.  Seems to be better after Imodium dose was increased to 4 times a day.   T11-T12 Chance fracture -Status postsurgical stabilization by neurosurgery -Continue PT/OT  History of ankylosing spondylitis, kyphoscoliosis -Patient's wife, patient was physically functional prior to this hospitalization.  Continue PT/OT  History of adenocarcinoma s/p LUL lobectomy (2016) History of thyroid and prostate cancer -Continue outpatient follow-up with pulmonology.  Hypothyroidism Continue Synthroid   Impaired mobility -PT eval obtained.  CIR recommended.  But it was denied by his insurance.    Mobility: Needs continued therapy while in the hospital to prevent deconditioning. Code Status:   Code Status: Full Code  Nutritional status: Body mass index is 26.78 kg/m. Nutrition Problem: Inadequate oral intake Etiology: inability to eat Signs/Symptoms: NPO status Diet Order            Diet NPO time specified  Diet effective now  DVT prophylaxis: SCD's Start: 09/15/20 1734   Antimicrobials:  None Fluid: Free water through tube feeding.  Not on IV fluid. Consultants: ENT Family Communication:  Not at bedside  Status is: Inpatient  Remains inpatient appropriate because: New tracheostomy, has feeding through Dobhoff.  Pending laryngeal biopsy report.  Dispo: The patient is from: Home              Anticipated d/c is to: CIR if authorized by insurance.  Not medically stable for next several days              Patient currently is not medically stable to d/c.   Difficult to place patient No    Infusions:  . sodium chloride 250 mL (09/23/20  1109)  . feeding supplement (OSMOLITE 1.5 CAL) 1,000 mL (10/04/20 2300)    Scheduled Meds: . acetaminophen (TYLENOL) oral liquid 160 mg/5 mL  500 mg Per Tube Q6H  . acetylcysteine  600 mg Nebulization BID  . atenolol  25 mg Per NG tube BID  . bethanechol  5 mg Per NG tube QID  . budesonide (PULMICORT) nebulizer solution  0.5 mg Nebulization BID  . busPIRone  15 mg Per Tube BID  . chlorhexidine  15 mL Mouth/Throat BID  . cholestyramine  4 g Per Tube BID AC  . diltiazem  30 mg Per Tube Q6H  . feeding supplement (PROSource TF)  45 mL Per Tube TID  . fiber  1 packet Per Tube BID  . free water  200 mL Per Tube Q6H  . Gerhardt's butt cream   Topical BID  . guaiFENesin-dextromethorphan  5 mL Per Tube Q8H  . insulin aspart  0-20 Units Subcutaneous Q4H  . insulin aspart  10 Units Intravenous Once  . insulin glargine  12 Units Subcutaneous BID  . levothyroxine  125 mcg Per Tube QAC breakfast  . loperamide HCl  4 mg Per Tube QID  . mouth rinse  15 mL Mouth Rinse 10 times per day  . nutrition supplement (JUVEN)  1 packet Per Tube BID BM  . revefenacin  175 mcg Nebulization Daily  . sodium bicarbonate  50 mEq Intravenous Once  . sodium chloride HYPERTONIC  4 mL Nebulization Daily    Antimicrobials: Anti-infectives (From admission, onward)   Start     Dose/Rate Route Frequency Ordered Stop   09/20/20 1000  cefTRIAXone (ROCEPHIN) 2 g in sodium chloride 0.9 % 100 mL IVPB        2 g 200 mL/hr over 30 Minutes Intravenous Every 24 hours 09/20/20 0821 09/25/20 0755   09/15/20 1830  ceFAZolin (ANCEF) IVPB 2g/100 mL premix        2 g 200 mL/hr over 30 Minutes Intravenous Every 8 hours 09/15/20 1733 09/16/20 0232   09/15/20 1616  vancomycin (VANCOCIN) powder  Status:  Discontinued          As needed 09/15/20 1616 09/15/20 1648   09/15/20 0800  ceFAZolin (ANCEF) IVPB 2g/100 mL premix        2 g 200 mL/hr over 30 Minutes Intravenous To Short Stay 09/15/20 0704 09/15/20 1515   09/10/20 1300   ceFEPIme (MAXIPIME) 2 g in sodium chloride 0.9 % 100 mL IVPB  Status:  Discontinued        2 g 200 mL/hr over 30 Minutes Intravenous  Once 09/10/20 1245 09/10/20 1403   09/10/20 1300  metroNIDAZOLE (FLAGYL) IVPB 500 mg  Status:  Discontinued        500 mg 100  mL/hr over 60 Minutes Intravenous  Once 09/10/20 1245 09/10/20 1403   09/10/20 1300  vancomycin (VANCOCIN) IVPB 1000 mg/200 mL premix  Status:  Discontinued        1,000 mg 200 mL/hr over 60 Minutes Intravenous  Once 09/10/20 1245 09/10/20 1415      PRN meds: sodium chloride, acetaminophen **OR** acetaminophen, albuterol, ALPRAZolam, diphenhydrAMINE, loperamide HCl, ondansetron (ZOFRAN) IV, oxyCODONE, phenol   Objective: Vitals:   10/05/20 0805 10/05/20 1137  BP:  (!) 114/46  Pulse:  71  Resp:  (!) 22  Temp:    SpO2: 98% 100%    Intake/Output Summary (Last 24 hours) at 10/05/2020 1144 Last data filed at 10/04/2020 1831 Gross per 24 hour  Intake 1050 ml  Output 710 ml  Net 340 ml   Filed Weights   09/27/20 0500 09/28/20 0226 09/29/20 0500  Weight: 82.8 kg 82.8 kg 82.3 kg   Weight change:  Body mass index is 26.78 kg/m.   Physical Exam: General exam: Pleasant, elderly Caucasian male.  Not in distress Skin: No rashes, lesions or ulcers. HEENT: Atraumatic, normocephalic, no obvious bleeding.  Has tracheostomy tube anteriorly, continues to have thick secretions. Tube feeding through Dobbhoff. Lungs: Clear to auscultation bilaterally CVS: Regular rate and rhythm, no murmur GI/Abd soft, mild generalized tenderness with Crohn's disease, bowel sound present CNS: Nods his head and moves lips to answer questions.  Alert, awake, oriented x3 Psychiatry: Depressed look Extremities: No pedal edema, no calf tenderness  Data Review: I have personally reviewed the laboratory data and studies available.  Recent Labs  Lab 09/30/20 0324 10/01/20 0346 10/02/20 0748 10/03/20 0154 10/05/20 0236  WBC 13.1* 12.3* 12.3* 13.0*  15.8*  NEUTROABS 10.6* 9.9* 10.0* 10.8* 15.0*  HGB 8.5* 8.4* 7.7* 7.8* 8.2*  HCT 27.6* 26.6* 24.9* 24.9* 26.5*  MCV 96.2 95.3 94.3 94.0 95.3  PLT 380 320 378 400 499*   Recent Labs  Lab 10/01/20 0346 10/02/20 0748 10/03/20 0154 10/03/20 1511 10/05/20 0236 10/05/20 0755  NA 133* 130* 129*  --  131* 130*  K 5.2* 5.5* 5.1  --  6.2* 7.0*  CL 94* 92* 89*  --  92* 91*  CO2 30 33* 32  --  31 32  GLUCOSE 206* 188* 183*  --  235* 360*  BUN 50* 48* 47*  --  56* 60*  CREATININE 1.15 1.13 1.20  --  1.34* 1.44*  CALCIUM 8.5* 8.6* 8.6*  --  8.5* 8.5*  MG  --   --   --  2.1  --   --   PHOS  --   --   --  3.7  --   --     F/u labs ordered Unresulted Labs (From admission, onward)          Start     Ordered   10/05/20 5102  Basic metabolic panel  Once-Timed,   TIMED       Question:  Specimen collection method  Answer:  Lab=Lab collect   10/05/20 1004   10/05/20 0500  CBC with Differential/Platelet  Daily,   R     Question:  Specimen collection method  Answer:  Lab=Lab collect   10/04/20 0800   10/05/20 5852  Basic metabolic panel  Daily,   R     Question:  Specimen collection method  Answer:  Lab=Lab collect   10/04/20 0800          Signed, Terrilee Croak, MD [facility] 10/05/2020

## 2020-10-05 NOTE — Consult Note (Addendum)
[facility] CONSULT NOTE    Date: 10/05/2020                  Patient Name:  Devin [name]  MRN: [id number]  DOB: 1940-07-03  Age / Sex: 80 y.o., male         PCP: Prince Solian, MD                 Service Requesting Consult:  [facility] hospitalist                 Reason for Consult:  Hyperkalemia            History of Present Illness: Patient is a 80 y.o. 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 on 09/10/2020 for evaluation of pain and dyspnea.  Patient required intubation and was admitted to the ICU. Patient is status post spinal fusion by neurosurgery on 4/8.  There are multiple filled extubation attempts therefore patient was trached.  He was transferred out of the ICU on 4/20.  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  Patient reports diarrhea is significant change is his diarrhea compared to home. Denies current pain. Reports baseline LE swelling.     Medications: Outpatient medications: Medications Prior to Admission  Medication Sig Dispense Refill Last Dose  . albuterol (VENTOLIN HFA) 108 (90 Base) MCG/ACT inhaler Inhale 2 puffs into the lungs every 6 (six) hours as needed for wheezing or shortness of breath. 8 g 6 unknown  . ALPRAZolam (XANAX) 0.5 MG tablet Take 0.5 mg by mouth 2 (two) times daily as needed for anxiety.   unknown  . amoxicillin-clavulanate (AUGMENTIN) 875-125 MG tablet Take 1 tablet by mouth every 12 (twelve) hours.   4/1 or 4/2  . apixaban (ELIQUIS) 5 MG TABS tablet Take 5 mg by mouth 2 (two) times daily.   4/2?? at 1930??  . atenolol (TENORMIN) 25 MG tablet Take 1 tablet (25 mg total) by mouth 2 (two) times daily.   4/2?? at 1930??  . busPIRone (BUSPAR) 15 MG tablet Take 15 mg by mouth 2 (two) times  daily.    4/1 or 4/2  . calcium-vitamin D (OSCAL WITH D) 500-200 MG-UNIT tablet Take 2 tablets by mouth daily.    4/1 or 4/2  . hydrochlorothiazide (HYDRODIURIL) 12.5 MG tablet Take 12.5 mg by mouth every morning.     Marland Kitchen HYDROcodone-acetaminophen (NORCO/VICODIN) 5-325 MG tablet Take 1 tablet by mouth every 6 (six) hours as needed (pain).   4/1 or 4/2  . insulin lispro (HUMALOG KWIKPEN) 100 UNIT/ML KiwkPen INJECT UNDER THE SKIN THREE TIMES DAILY( 25 UNITS, 25 UNITS AND 20 UNITS RESPECTIVELY BEFORE MEALS) (Patient taking differently: Inject 20-30 Units into the skin See admin instructions. Inject 25 units subcutaneously before breakfast, 20 units before lunch and 30 units before supper) 30 mL 5 4/1 or 4/2  . levothyroxine (SYNTHROID) 125 MCG tablet Take 1 tablet (125 mcg total) by mouth daily before breakfast. 30 tablet 0 4/1 or 4/2  . Mesalamine 800 MG TBEC Take 1,600 mg by mouth 2 (two) times daily.   4/1 or 4/2  . methocarbamol (ROBAXIN) 500 MG tablet Take 500 mg by mouth every 8 (eight) hours.   4/1 or 4/2  . Multiple Vitamins-Iron (DAILY-VITE/IRON/BETA-CAROTENE) TABS Take 1 tablet  by mouth daily.    4/1 or 4/2  . Tiotropium Bromide Monohydrate (SPIRIVA RESPIMAT) 2.5 MCG/ACT AERS Inhale 2 puffs into the lungs daily. 4 g 0 4/1 or 4/2  . zinc gluconate 50 MG tablet Take 50 mg by mouth daily.   4/1 or 4/2  . B-D ULTRAFINE III SHORT PEN 31G X 8 MM MISC USE 1 PEN NEEDLE FOUR TIMES DAILY. 100 each 0   . diltiazem (CARDIZEM) 30 MG tablet Take 1 tablet every 4 hours AS NEEDED for heart rate >100 as long as blood pressure >100. (Patient not taking: No sig reported) 45 tablet 1 Not Taking at Unknown time  . Glucose Blood (ASCENSIA CONTOUR TEST VI) Use as directed to test two times a day     . glucose blood (BAYER CONTOUR TEST) test strip TEST BLOOD SUGAR TWICE DAILY AS DIRECTED. 200 each 2   . glucose blood test strip 1 each by Other route 2 times daily.     . Insulin Pen Needle 31G X 8 MM MISC Inject 31 g  into the skin.     Marland Kitchen leuprolide (LUPRON) 11.25 MG injection Inject 11.25 mg into the muscle every 6 (six) months.    7-8 months ago    Current medications: Current Facility-Administered Medications  Medication Dose Route Frequency Provider Last Rate Last Admin  . 0.9 %  sodium chloride infusion   Intravenous PRN Eustace Moore, MD 10 mL/hr at 09/23/20 1109 250 mL at 09/23/20 1109  . acetaminophen (TYLENOL) 160 MG/5ML solution 500 mg  500 mg Per Tube Q6H Eustace Moore, MD   500 mg at 10/05/20 0700  . acetaminophen (TYLENOL) tablet 650 mg  650 mg Oral Q4H PRN Eustace Moore, MD   650 mg at 09/26/20 1230   Or  . acetaminophen (TYLENOL) suppository 650 mg  650 mg Rectal Q4H PRN Eustace Moore, MD      . acetylcysteine (MUCOMYST) 20 % nebulizer / oral solution 600 mg  600 mg Nebulization BID Terrilee Croak, MD   600 mg at 10/05/20 0757  . albuterol (PROVENTIL) (2.5 MG/3ML) 0.083% nebulizer solution 2.5 mg  2.5 mg Nebulization Q4H PRN Dahal, Marlowe Aschoff, MD   2.5 mg at 10/05/20 0757  . ALPRAZolam Duanne Moron) tablet 0.5 mg  0.5 mg Per Tube BID PRN Kipp Brood, MD   0.5 mg at 10/03/20 1702  . atenolol (TENORMIN) tablet 25 mg  25 mg Per NG tube BID Erick Colace, NP   25 mg at 10/05/20 0857  . bethanechol (URECHOLINE) tablet 5 mg  5 mg Per NG tube QID Erick Colace, NP   5 mg at 10/05/20 0857  . budesonide (PULMICORT) nebulizer solution 0.5 mg  0.5 mg Nebulization BID Eustace Moore, MD   0.5 mg at 10/05/20 0806  . busPIRone (BUSPAR) tablet 15 mg  15 mg Per Tube BID Eustace Moore, MD   15 mg at 10/05/20 0857  . chlorhexidine (PERIDEX) 0.12 % solution 15 mL  15 mL Mouth/Throat BID Terrilee Croak, MD   15 mL at 10/05/20 0854  . cholestyramine (QUESTRAN) packet 4 g  4 g Per Tube BID AC Dahal, Binaya, MD   4 g at 10/01/20 1800  . diltiazem (CARDIZEM) 10 mg/ml oral suspension 30 mg  30 mg Per Tube Q6H Erick Colace, NP   30 mg at 10/05/20 0700  . diphenhydrAMINE (BENADRYL) capsule 25 mg  25 mg Per NG tube  QHS PRN Terrilee Croak, MD  25 mg at 10/03/20 2343  . feeding supplement (OSMOLITE 1.5 CAL) liquid 1,000 mL  1,000 mL Per Tube Continuous Margaretha Seeds, MD 55 mL/hr at 10/04/20 2300 1,000 mL at 10/04/20 2300  . feeding supplement (PROSource TF) liquid 45 mL  45 mL Per Tube TID Margaretha Seeds, MD   45 mL at 10/05/20 0855  . fiber (NUTRISOURCE FIBER) 1 packet  1 packet Per Tube BID Erick Colace, NP   1 packet at 10/05/20 [phone]  . free water 200 mL  200 mL Per Tube Q6H Chand, Sudham, MD   200 mL at 10/05/20 0700  . Gerhardt's butt cream   Topical BID Kipp Brood, MD   Given at 10/05/20 [phone]  . guaiFENesin-dextromethorphan (ROBITUSSIN DM) 100-10 MG/5ML syrup 5 mL  5 mL Per Tube Q8H Dahal, Binaya, MD   5 mL at 10/05/20 0700  . insulin aspart (novoLOG) injection 0-20 Units  0-20 Units Subcutaneous Q4H Kipp Brood, MD   15 Units at 10/05/20 0855  . insulin aspart (novoLOG) injection 10 Units  10 Units Intravenous Once Dahal, Binaya, MD      . insulin glargine (LANTUS) injection 12 Units  12 Units Subcutaneous BID Terrilee Croak, MD   12 Units at 10/05/20 0855  . levothyroxine (SYNTHROID) tablet 125 mcg  125 mcg Per Tube QAC breakfast Eustace Moore, MD   125 mcg at 10/05/20 0700  . loperamide HCl (IMODIUM) 1 MG/7.5ML suspension 2 mg  2 mg Per Tube BID PRN Margaretha Seeds, MD   2 mg at 09/25/20 2338  . loperamide HCl (IMODIUM) 1 MG/7.5ML suspension 4 mg  4 mg Per Tube QID Terrilee Croak, MD   4 mg at 10/05/20 0857  . MEDLINE mouth rinse  15 mL Mouth Rinse 10 times per day Eustace Moore, MD   15 mL at 10/05/20 0902  . nutrition supplement (JUVEN) (JUVEN) powder packet 1 packet  1 packet Per Tube BID BM Margaretha Seeds, MD   1 packet at 10/05/20 [phone]  . ondansetron (ZOFRAN) injection 4 mg  4 mg Intravenous Q6H PRN Margaretha Seeds, MD   4 mg at 09/24/20 1208  . oxyCODONE (ROXICODONE) 5 MG/5ML solution 5 mg  5 mg Per Tube Q4H PRN Dahal, Marlowe Aschoff, MD   5 mg at 10/04/20 2300  . phenol  (CHLORASEPTIC) mouth spray 1 spray  1 spray Mouth/Throat PRN Bowser, Laurel Dimmer, NP   1 spray at 09/25/20 1019  . revefenacin (YUPELRI) nebulizer solution 175 mcg  175 mcg Nebulization Daily Margaretha Seeds, MD   175 mcg at 10/05/20 0757  . sodium bicarbonate injection 50 mEq  50 mEq Intravenous Once Dahal, Binaya, MD      . sodium chloride HYPERTONIC 3 % nebulizer solution 4 mL  4 mL Nebulization Daily Dahal, Binaya, MD   4 mL at 10/05/20 0805  . sodium polystyrene (KAYEXALATE) 15 GM/60ML suspension 30 g  30 g Per Tube Once Dahal, Marlowe Aschoff, MD          Allergies: No Known Allergies    Past Medical History: Past Medical History:  Diagnosis Date  . A-fib ([place])   . Adenocarcinoma of left lung, stage 1 ([place]) 03/10/2015  . Ankylosing spondylitis ([place])    Diagnosed during lumbar fracture summer of 2016    . Crohn's disease ([place])   . Gout   . HIstory of basal cell cancer of face    THYROID CA HX  . History of kidney stones   .  Hypertension   . Hypothyroidism   . Impotence   . 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 Obesity (BMI 30-39.9)   . Osteoporosis    Pt completed 5 years of fosamax in 2013     . Prostate cancer with recurrence    Treated with prostatectomy with recurrence 2012 with Lupron treatment now him   . Psoriasis   . Thyroid cancer ([place]) 1999   Treated with RAI and total thyroidectomy   . Type II diabetes mellitus ([place])      Past Surgical History: Past Surgical History:  Procedure Laterality Date  . ABDOMINAL EXPLORATION SURGERY     for small bowel obstruction  . APPENDECTOMY  03/2007  . BACK SURGERY    . BASAL CELL CARCINOMA EXCISION  "several"   "head"  . CARDIAC CATHETERIZATION  03/17/2003  . CHOLECYSTECTOMY N/A 05/07/2016   Procedure: LAPAROSCOPIC CHOLECYSTECTOMY;  Surgeon: Fanny Skates, MD;  Location: [place];  Service: General;  Laterality: N/A;  . COLON SURGERY  03/2007   Resection of cecum, appendix, terminal ileum  (approximately/notes 10/10/2010  . CYSTOSCOPY/URETEROSCOPY/HOLMIUM LASER/STENT PLACEMENT Right 07/03/2020   Procedure: CYSTOSCOPY/RETROGRADE/URETEROSCOPY REMOVAL OF BLADDER STONE;  Surgeon: Raynelle Bring, MD;  Location: [facility];  Service: Urology;  Laterality: Right;  . DIRECT LARYNGOSCOPY N/A 10/04/2020   Procedure: DIRECT LARYNGOSCOPY, PHARYNGOSCOPY WITH BIOPSY;  Surgeon: [name], DO;  Location: [place];  Service: ENT;  Laterality: N/A;  . ESOPHAGOSCOPY N/A 10/04/2020   Procedure: ESOPHAGOSCOPY WITH BIOPSY;  Surgeon: [name], DO;  Location: [place];  Service: ENT;  Laterality: N/A;  . HERNIA REPAIR    . LAMINECTOMY WITH POSTERIOR LATERAL ARTHRODESIS LEVEL 1 N/A 09/15/2020   Procedure: REVISION OF THORACOLUMBAR FUSION, ADDITION OF CROSS-CONNECTORS;  Surgeon: Eustace Moore, MD;  Location: [place];  Service: Neurosurgery;  Laterality: N/A;  . LAPAROSCOPIC CHOLECYSTECTOMY  05/07/2016  . LAPAROSCOPIC LYSIS OF ADHESIONS  05/07/2016  . LAPAROSCOPIC LYSIS OF ADHESIONS N/A 05/07/2016   Procedure: LAPAROSCOPIC LYSIS OF ADHESIONS TIMES ONE HOUR;  Surgeon: Fanny Skates, MD;  Location: [place];  Service: General;  Laterality: N/A;  . POSTERIOR FUSION THORACIC SPINE  02/08/2016   1. Posterior thoracic arthrodesis T7-T11 utilizing morcellized allograft, 2. Posterior thoracic segmental fixation T7-T11 utilizing nuvasive pedicle screws  . PROSTATECTOMY  06/2001   w/bilateral pelvic lymph nose dissection/notes 10/24/2010  . SPINAL FUSION  12/2014   Open reduction internal fixation of L1 Chance fracture with posterior fusion T10-L4 utilizing morcellized allograft and some local autograft, segmental instrumentation T10-L4 inclusive utilizing nuvasive pedicle screws/notes 12/16/2014  . Stress Cardiolite  02/17/2003  . THOROCOTOMY WITH LOBECTOMY  03/16/2015   Procedure: THOROCOTOMY WITH LOBECTOMY;  Surgeon: Ivin Poot, MD;  Location: [place];  Service: Thoracic;;  . TONSILLECTOMY    . TOTAL  THYROIDECTOMY  1997  . Venous Doppler  05/30/2004  . VENTRAL HERNIA REPAIR  04/14/2008  . VIDEO ASSISTED THORACOSCOPY Left 03/16/2015   Procedure: VIDEO ASSISTED THORACOSCOPY;  Surgeon: Ivin Poot, MD;  Location[facility];  Service: Thoracic;  Laterality: Left;     Family History: Family History  Problem Relation Age of Onset  . CAD Mother   . Cancer Neg Hx        No cancer in the patient's immediate family, except of course for the patient himself, as noted     Social History: Social History   Socioeconomic History  . Marital status: Married    Spouse name: Not on file  .  Number of children: Not on file  . Years of education: Not on file  . Highest education level: Not on file  Occupational History  . Occupation: Retired  Tobacco Use  . Smoking status: Former Smoker    Packs/day: 2.00    Years: 30.00    Pack years: 60.00    Types: Cigarettes    Quit date: 12/26/1989    Years since quitting: 30.7  . Smokeless tobacco: Never Used  Vaping Use  . Vaping Use: Never used  Substance and Sexual Activity  . Alcohol use: Not Currently    Alcohol/week: 0.0 standard drinks  . Drug use: No  . Sexual activity: Never  Other Topics Concern  . Not on file  Social History Narrative   Retired Development worker, international aid   Social Determinants of Radio broadcast assistant Strain: Not on [employer] Insecurity: Not on file  Transportation Needs: Not on file  Physical Activity: Not on file  Stress: Not on file  Social Connections: Not on file  Intimate Partner Violence: Not on file     Review of Systems: As per HPI  Vital Signs: Blood pressure (!) 108/49, pulse 70, temperature 98.1 F (36.7 C), temperature source Oral, resp. rate 17, height 5' 9.02" (1.753 m), weight 82.3 kg, SpO2 98 %.  Weight trends: Filed Weights   09/27/20 0500 09/28/20 0226 09/29/20 0500  Weight: 82.8 kg 82.8 kg 82.3 kg    Physical Exam: General: Vital signs reviewed and noted. Well-developed male resting in  bed, in no acute distress; alert, appropriate and cooperative throughout examination.  Head: Normocephalic, atraumatic.  Eyes: Pupils equal and reactive to light. No scleral icterus  Nose: NG tube present.   Neck: Tracheostomy present, no JVD.  Lungs:  Normal respiratory effort. Clear to auscultation bilaterally  Heart: Regular rate and rhythm without gallop, murmur, or rubs.  Abdomen:  Bowel sounds present. Soft, Nondistended, non-tender.   Extremities: +1 lower extremity edema.  Neurologic: Alert, responds to questions with with head and hand movements. Sensations intact to light touch  Skin: Dry scaly skin on lower extremities     Lab results: Basic Metabolic Panel: Recent Labs  Lab 10/03/20 0154 10/03/20 1511 10/05/20 0236 10/05/20 0755  NA 129*  --  131* 130*  K 5.1  --  6.2* 7.0*  CL 89*  --  92* 91*  CO2 32  --  31 32  GLUCOSE 183*  --  235* 360*  BUN 47*  --  56* 60*  CREATININE 1.20  --  1.34* 1.44*  CALCIUM 8.6*  --  8.5* 8.5*  MG  --  2.1  --   --   PHOS  --  3.7  --   --     Liver Function Tests: No results for input(s): AST, ALT, ALKPHOS, BILITOT, PROT, ALBUMIN in the last 168 hours. No results for input(s): LIPASE, AMYLASE in the last 168 hours. No results for input(s): AMMONIA in the last 168 hours.  CBC: Recent Labs  Lab 09/30/20 0324 10/01/20 0346 10/02/20 0748 10/03/20 0154 10/05/20 0236  WBC 13.1* 12.3* 12.3* 13.0* 15.8*  NEUTROABS 10.6* 9.9* 10.0* 10.8* 15.0*  HGB 8.5* 8.4* 7.7* 7.8* 8.2*  HCT 27.6* 26.6* 24.9* 24.9* 26.5*  MCV 96.2 95.3 94.3 94.0 95.3  PLT 380 320 378 400 499*    Cardiac Enzymes: No results for input(s): CKTOTAL, CKMB, CKMBINDEX, TROPONINI in the last 168 hours.  BNP: Invalid input(s): POCBNP  CBG: Recent Labs  Lab 10/04/20 1239  10/04/20 1908 10/04/20 2346 10/05/20 0306 10/05/20 0747  GLUCAP 130* 78 151* 256* 305*    Microbiology: Results for orders placed or performed during the hospital encounter of  09/10/20  Resp Panel by RT-PCR (Flu A&B, Covid) Nasopharyngeal Swab     Status: None   Collection Time: 09/10/20 10:33 AM   Specimen: Nasopharyngeal Swab; Nasopharyngeal(NP) swabs in vial transport medium  Result Value Ref Range Status   SARS Coronavirus 2 by RT PCR NEGATIVE NEGATIVE Final    Comment: (NOTE) SARS-CoV-2 target nucleic acids are NOT DETECTED.  The SARS-CoV-2 RNA is generally detectable in upper respiratory specimens during the acute phase of infection. The lowest concentration of SARS-CoV-2 viral copies this assay can detect is 138 copies/mL. A negative result does not preclude SARS-Cov-2 infection and should not be used as the sole basis for treatment or other patient management decisions. A negative result may occur with  improper specimen collection/handling, submission of specimen other than nasopharyngeal swab, presence of viral mutation(s) within the areas targeted by this assay, and inadequate number of viral copies(<138 copies/mL). A negative result must be combined with clinical observations, patient history, and epidemiological information. The expected result is Negative.  Fact Sheet for Patients:  EntrepreneurPulse.com.au  Fact Sheet for Healthcare Providers:  IncredibleEmployment.be  This test is no t yet approved or cleared by the [place] FDA and  has been authorized for detection and/or diagnosis of SARS-CoV-2 by FDA under an Emergency Use Authorization (EUA). This EUA will remain  in effect (meaning this test can be used) for the duration of the COVID-19 declaration under Section 564(b)(1) of the Act, 21 U.S.C.section 360bbb-3(b)(1), unless the authorization is terminated  or revoked sooner.       Influenza A by PCR NEGATIVE NEGATIVE Final   Influenza B by PCR NEGATIVE NEGATIVE Final    Comment: (NOTE) The Xpert Xpress SARS-CoV-2/FLU/RSV plus assay is intended as an aid in the diagnosis of influenza from  Nasopharyngeal swab specimens and should not be used as a sole basis for treatment. Nasal washings and aspirates are unacceptable for Xpert Xpress SARS-CoV-2/FLU/RSV testing.  Fact Sheet for Patients: EntrepreneurPulse.com.au  Fact Sheet for Healthcare Providers: IncredibleEmployment.be  This test is not yet approved or cleared by the [place] FDA and has been authorized for detection and/or diagnosis of SARS-CoV-2 by FDA under an Emergency Use Authorization (EUA). This EUA will remain in effect (meaning this test can be used) for the duration of the COVID-19 declaration under Section 564(b)(1) of the Act, 21 U.S.C. section 360bbb-3(b)(1), unless the authorization is terminated or revoked.  Performed at [facility], [place] [address] [place], [place] [address]   Culture, blood (single)     Status: None   Collection Time: 09/10/20 10:43 AM   Specimen: BLOOD  Result Value Ref Range Status   Specimen Description   Final    BLOOD SITE NOT SPECIFIED Performed at [facility] [address], [place], [place] [address]    Special Requests   Final    BOTTLES DRAWN AEROBIC AND ANAEROBIC Blood Culture adequate volume Performed at [facility] [address] [place], [place] [address]    Culture   Final    NO GROWTH 5 DAYS Performed at [facility], [place] [address] [place], [place] [address]    Report Status 09/15/2020 FINAL  Final  MRSA PCR Screening     Status: None   Collection Time: 09/10/20  3:23 PM  Specimen: Nasopharyngeal  Result Value Ref Range Status   MRSA by PCR NEGATIVE NEGATIVE Final    Comment:        The GeneXpert MRSA Assay (FDA approved for NASAL specimens only), is one component of a comprehensive MRSA colonization surveillance program. It is not intended to diagnose MRSA infection nor to guide or monitor treatment for MRSA infections. Performed at  [facility], [place] [address], [place], [place] [address]   Surgical pcr screen     Status: None   Collection Time: 09/14/20 10:09 PM   Specimen: Nasal Mucosa; Nasal Swab  Result Value Ref Range Status   MRSA, PCR NEGATIVE NEGATIVE Final   Staphylococcus aureus NEGATIVE NEGATIVE Final    Comment: (NOTE) The Xpert SA Assay (FDA approved for NASAL specimens in patients 20 years of age and older), is one component of a comprehensive surveillance program. It is not intended to diagnose infection nor to guide or monitor treatment. Performed at [facility], [place] [address] [place], [place] [address]   Culture, Respiratory w Gram Stain     Status: None   Collection Time: 09/18/20  2:10 PM   Specimen: Tracheal Aspirate; Respiratory  Result Value Ref Range Status   Specimen Description TRACHEAL ASPIRATE  Final   Special Requests NONE  Final   Gram Stain   Final    FEW WBC PRESENT, PREDOMINANTLY PMN FEW SQUAMOUS EPITHELIAL CELLS PRESENT ABUNDANT GRAM NEGATIVE RODS ABUNDANT GRAM POSITIVE RODS MODERATE GRAM POSITIVE COCCI Performed at [facility], [place] [address] [place], [place] [address]    Culture ABUNDANT KLEBSIELLA PNEUMONIAE  Final   Report Status 09/20/2020 FINAL  Final   Organism ID, Bacteria KLEBSIELLA PNEUMONIAE  Final      Susceptibility   Klebsiella pneumoniae - MIC*    AMPICILLIN >=32 RESISTANT Resistant     CEFAZOLIN <=4 SENSITIVE Sensitive     CEFEPIME <=0.12 SENSITIVE Sensitive     CEFTAZIDIME <=1 SENSITIVE Sensitive     CEFTRIAXONE <=0.25 SENSITIVE Sensitive     CIPROFLOXACIN <=0.25 SENSITIVE Sensitive     GENTAMICIN <=1 SENSITIVE Sensitive     IMIPENEM <=0.25 SENSITIVE Sensitive     TRIMETH/SULFA <=20 SENSITIVE Sensitive     AMPICILLIN/SULBACTAM 8 SENSITIVE Sensitive     PIP/TAZO <=4 SENSITIVE Sensitive     * ABUNDANT KLEBSIELLA PNEUMONIAE  C Difficile Quick Screen (NO PCR Reflex)     Status: None   Collection Time: 09/20/20  1:38  PM   Specimen: STOOL  Result Value Ref Range Status   C Diff antigen NEGATIVE NEGATIVE Final   C Diff toxin NEGATIVE NEGATIVE Final   C Diff interpretation No C. difficile detected.  Final    Comment: Performed at [facility], [place] [address] [place], [place] [address]  Gastrointestinal Panel by PCR , Stool     Status: None   Collection Time: 09/20/20  1:38 PM   Specimen: STOOL  Result Value Ref Range Status   Campylobacter species NOT DETECTED NOT DETECTED Final   Plesimonas shigelloides NOT DETECTED NOT DETECTED Final   Salmonella species NOT DETECTED NOT DETECTED Final   Yersinia enterocolitica NOT DETECTED NOT DETECTED Final   Vibrio species NOT DETECTED NOT DETECTED Final   Vibrio cholerae NOT DETECTED NOT DETECTED Final   Enteroaggregative E coli (EAEC) NOT DETECTED NOT DETECTED Final   Enteropathogenic E coli (EPEC) NOT DETECTED NOT DETECTED Final   Enterotoxigenic E coli (ETEC) NOT DETECTED NOT DETECTED Final  Shiga like toxin producing E coli (STEC) NOT DETECTED NOT DETECTED Final   Shigella/Enteroinvasive E coli (EIEC) NOT DETECTED NOT DETECTED Final   Cryptosporidium NOT DETECTED NOT DETECTED Final   Cyclospora cayetanensis NOT DETECTED NOT DETECTED Final   Entamoeba histolytica NOT DETECTED NOT DETECTED Final   Giardia lamblia NOT DETECTED NOT DETECTED Final   Adenovirus F40/41 NOT DETECTED NOT DETECTED Final   Astrovirus NOT DETECTED NOT DETECTED Final   Norovirus GI/GII NOT DETECTED NOT DETECTED Final   Rotavirus A NOT DETECTED NOT DETECTED Final   Sapovirus (I, II, IV, and V) NOT DETECTED NOT DETECTED Final    Comment: Performed at [facility], [place] [place], [place] [address]    Coagulation Studies: No results for input(s): LABPROT, INR in the last 72 hours.  Urinalysis: No results for input(s): COLORURINE, LABSPEC, PHURINE, GLUCOSEU, HGBUR, BILIRUBINUR, KETONESUR, PROTEINUR, UROBILINOGEN, NITRITE, LEUKOCYTESUR in the last 72  hours.  Invalid input(s): APPERANCEUR    Imaging: DG Abd 1 View  Result Date: 10/04/2020 CLINICAL DATA:  Feeding tube placement. EXAM: ABDOMEN - 1 VIEW COMPARISON:  09/16/2020. FINDINGS: Interim removal of NG tube and placement of feeding tube. Feeding tube tip noted over the stomach. Basilar atelectasis. Left pleural effusion cannot be excluded. Postsurgical changes thoracolumbar spine. Surgical clips noted over the abdomen. IMPRESSION: Interim removal of NG tube and placement of feeding tube. Feeding tube tip noted over the stomach. Electronically Signed   By: Marcello Moores  Register   On: 10/04/2020 12:23      Assessment & Plan:  Active Hospital Problems   Diagnosis Date Noted  . Status post tracheostomy ([place])   . Atrial fibrillation ([place])   . History of cancer   . Diabetes mellitus type 2 in nonobese ([facility])   . Chronic obstructive pulmonary disease ([place])   . Ineffective airway clearance   . Acute encephalopathy   . Status post thoracic spinal fusion 09/15/2020  . Pressure injury of skin 09/11/2020  . Left rib fracture 09/10/2020  . Hemothorax on left 09/10/2020  . Acute on chronic respiratory failure with hypoxia and hypercapnia ([place]) 09/10/2020  . Acute and chronic respiratory failure, unspecified whether with hypoxia or hypercapnia ([place]) 07/03/2020  . Thoracic compression fracture ([place]) 02/08/2016  . S/P lobectomy of lung 03/16/2015    Resolved Hospital Problems  No resolved problems to display.   Pt is a 80 y.o. yo male with a PMHX of T2DM, COPD, atrial fibrillation (Eliquis), Crohn's disease, lung cancer s/p LUL lobectomy (2016), thyroidectomy due to thyroid cancer, ankylosing spondylitis was admitted to [facility] on 09/10/2020 for shortness of breath requiring tracheostomy on 09/19/2020.   Hyperkalemia -patient previously hypokalemic requiring repletion.  Potassium rising 7.0 from 6.2 this morning.  EKG with evidence of peaked T waves. Consider calcium gluconate. Give Kayexalate 30 g  today, repeat if necessary. Give additional 10u short acting insulin as blood glucose is 360.  Provide dextrose if needed. Repeat BMP.  - will also give lasix 60 mg IV once now and repeat potassium - will transition tube feeds to nepro - would optimize glucose control per primary  CKD stage 3a  - Renal function at baseline of approximately Cr 1.3-1.4  Acute hypoxic respiratory failure  COPD restrictive lung disease secondary to scoliosis and ankylosing spondylitis -tracheostomy on 4/12.  Plan per primary - optimize volume as above  Klebsiella pneumonia -on ceftriaxone.  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 Has NG tube. ENT performed biopsy  yesterday.  - transition to nepro as above  Diarrhea in the setting of Crohn's disease- on Imodium.  Avoid dehydration.  T2DM -glucose 360 today.  Additional insulin will likely decrease K load.   Normocytic anemia - Patient has low iron stores.  On admission, hemoglobin 12.8 and now 8.2.  Transfuse as needed  History of hypertension -now hypotensive.  On atenolol and diltiazem.  Atrial fibrillation -On atenolol and diltiazem.  Currently rate controlled heart rate 60s to 80.   Acute T11 fracture - Pt fell on 3/31 and found to have new T11 fracture and left 7th rib fracture. He failed outpatient treatment and presented to the ED. He had T11-T12 stabilization on 4/8 by neurosurgery.  Plan for [facility].  Hypothyroidism-Per primary  History of adenocarcinoma of the lung, thyroid and prostate cancer -follow-up outpatient  DVT PPX -Eliquis held due to surgery yesterday, restart when able    Lyndee Hensen, DO 10/05/20  10:35 AM   Seen and examined independently.  Agree with note and exam as documented above by Lyndee Hensen, DO and amended by me above.    Patient with DM and CKD presented after fall and s/p 511 fracture and underwent stabilization with NSGY now with hyperkalemia.  Pt states on  daily diuretic at home and HCTZ is on med list  Hyperkalemia - in addition to initial note will give lasix 60 mg IV once, lokelma, transition to nepro, and repeat K 4 pm.  Notify Dr. Royce Macadamia if K is above 5.5.  CKD stage 3a - near baseline  Dysphagia - transitioning to nepro   Acute hypoxic resp failure - optimize volume status.   Claudia Desanctis, MD 10/05/2020  1:55 PM

## 2020-10-05 NOTE — Progress Notes (Signed)
Nutrition Follow-up  DOCUMENTATION CODES:   Not applicable  INTERVENTION:  Provide Nepro formula via Cortrak NGT at rate of 50 ml/hr.  Provide 45 ml Prosource TF once daily per tube.   Free water flushes of 200 ml q 6 hours per tube.   Tube feeding regimen provides 2200 kcal, 108 grams of protein, and 1712 ml free water.   Continue Juven BID per tube, each packet provides 95 calories, 2.5 grams of protein for wound healing.   NUTRITION DIAGNOSIS:   Inadequate oral intake related to inability to eat as evidenced by NPO status; ongoing  GOAL:   Patient will meet greater than or equal to 90% of their needs; met with TF  MONITOR:   TF tolerance,Skin,Weight trends,Labs,I & O's  REASON FOR ASSESSMENT:   Ventilator,Consult Enteral/tube feeding initiation and management  ASSESSMENT:   80 y/o male with medical history of afib, adenocarcinoma of the L lung, ankylosing spondylitis, Crohn's disease, nephrolithiasis, HTN, DM, obesity, osteoporosis, prostate cancer, psoriasis, thyroid cancer, and recently dx COPD. He presented to the ED due to back pain. He experienced a fall on 3/31; seen by his PCP for thoracic spine xray, lumbar spine xray, and CXR of the L ribs. He has had increased back pain since the fall and having nausea, dry heaving, and no vomiting episodes. He is not on chemo at this time. CT scan of neck soft tissue was obtained which raised suspicion of laryngeal carcinoma.  4/03 Intubated 4/09 Failed Extubation 4/11 Extubated, Re-Intubated, Cortrak placed; tip gastric  4/12 s/p trach 4/14-4/18 transitioned to trach collar off vent 4/27 s/p laryngoscopy with biopsy  Pt continues on trach collar. Pt with elevated potassium levels, hyperkalemia. Lasix ordered. MD has switched tube feeding formula over to Nepro formula to aid in decreasing potassium levels. RD to modify tube feeding orders to better nutrition needs. Per MD plans for further work up on potential laryngeal  carcinoma prior to decision if long term feeding tube PEG is needed.    Labs and medications reviewed. Potassium elevated at 6.3. Nutrisource fiber 1 packet BID per tube daily ordered.   Diet Order:   Diet Order            Diet NPO time specified  Diet effective now                 EDUCATION NEEDS:   No education needs have been identified at this time  Skin:  Skin Assessment: Reviewed RN Assessment Skin Integrity Issues:: Stage II Stage II: sacrum, mid lower back  Last BM:  4/28 rectal tube in place  Height:   Ht Readings from Last 1 Encounters:  09/10/20 5' 9.02" (1.753 m)    Weight:   Wt Readings from Last 1 Encounters:  09/29/20 82.3 kg    Ideal Body Weight:  72.73 kg  BMI:  Body mass index is 26.78 kg/m.  Estimated Nutritional Needs:   Kcal:  2000-2200  Protein:  100-125 grams  Fluid:  >/= 2 L/day  Corrin Parker, MS, RD, LDN RD pager number/after hours weekend pager number on Amion.

## 2020-10-05 NOTE — Progress Notes (Signed)
Inpatient Rehabilitation Admissions Coordinator  I met with patient , his wife and daughter, [name], at bedside. We discussed goals and expectations of a CIR admit. They agree with admit . I await medical readiness to admit. Hopefully in the next 24 to 48 hrs. I have discussed with Dr. Pietro Cassis. I will follow up tomorrow.  Danne Baxter, RN, MSN Rehab Admissions Coordinator [phone] 10/05/2020 5:02 PM

## 2020-10-05 NOTE — Progress Notes (Signed)
Inpatient Rehabilitation Admissions Coordinator  I have received approval on appeal from [facility] Medicare for CIR admit when patient medically ready. I have notified Dr. Pietro Cassis as well as pt's wife. I have made plans to meet with patient and  His wife today at 4 pm to further discuss rehab plans.  Danne Baxter, RN, MSN Rehab Admissions Coordinator [phone] 10/05/2020 12:09 PM

## 2020-10-05 NOTE — PMR Pre-admission (Signed)
PMR Admission Coordinator Pre-Admission Assessment  Patient: Devin [name] is an 80 y.o., male MRN: [id number] DOB: 06-17-1940 Height: 5' 9.02" (175.3 cm) Weight: 89.3 kg              Insurance Information HMO: yes    PPO:      PCP:      IPA:      80/20:      OTHER:  PRIMARY: [place] [employer]      Policy#: [id number]      Subscriber: pt CM Name: denial overturned on appeal per Jess      Phone#: [phone] option 7     Fax#: [phone] Pre-Cert#: [id number] approved for 7 days      Employer:  Benefits:  Phone #: [phone]     Name: 4/28 Eff. Date: 06/10/2020     Deduct: none      Out of Pocket Max: $3600      Life Max: none  CIR: $295 co pay per day days 1 until 6      SNF: no copay days 1 until 20; $188 co pay per day days 21 until 49; no copay days 41 until 100 Outpatient: $30 per visit     Co-Pay: visits per medical neccesity Home Health: 100%      Co-Pay: visits per medical neccesity DME: 80%     Co-Pay: 20% Providers: in network  SECONDARY: in network      Policy#:       Phone#:   Development worker, community:       Phone#:   [employer] Engineer, [employer]" for patients in Inpatient Rehabilitation Facilities with attached "Privacy Act [place] Records" was provided and verbally reviewed with: Patient and Family  Emergency Contact Information Contact Information    Name Relation Home Work Mobile   Ang[place] Spouse [phone]  [phone]   [facility] Daughter   [phone]   Jacalyn Lefevre Granddaughter   [phone]     Current Medical History  Patient Admitting Diagnosis: Debility  History of Present Illness: 80 y.o. male with history of A fib, Crohn's, lung cancer s/p RSXN, thyroid cancer, prostate cancer,  T2DM, COPD, ankylosing spondylitis with chronic LBP and gait disorder who sustained a fall on 09/07/2020 with progressive pain.  History taken from chart review and patient due to trach.  He was admitted on 09/10/2020 with ongoing  pain, shortness of breath, and agitation. He became unresponsive in ED requiring intubation for acute on chronic hypercarbic respiratory failure. CT chest revealed acute T11 vertebral fracture and TLSO recommended by Dr. Ronnald Ramp. Once cleared by PCCM, he underwent posterior thoracic fusion T11-T12 on 09/15/2020 he was unable to tolerate extubation X 2 despite decadron to help with edema and ultimately required tracheostomy on 09/19/2020.  Hospital course further complicated by atrial fibrillation with RVR, copious secretions,  Kleb PNA, fluid overload, diarrhea due to crohn's. He was started on weaning trials and has been off vent since 04/18. He was started on PMSV trials and therapy ongoing.  On 4/22 failed barium swallow evaluation. Per SLP, noted thickening of the prevertebral tissue at the level of C 4-5. CT cervical spine recommended and showed soft tissue which raised suspicion of laryngeal carcinoma. ENT, Dr. Fredric Dine consulted. Eliquis held and patient underwent  biopsy 4/27 with results penidng. Some worsening of secretions noted. Began on Robitussin /DM syrup, Mucomyst, albuterol, hypertonic saline and chest PT.   Nephrology consulted 4/28 for hyperkalemia of 7.0. Nephrology recommended Kayexalate, insulin and glucose, sodium bicarb.  and repeat BMP. IV lasix given and transitioned tube feeds to Nepro. Baseline renal function Cr 1.3- 1.4. Optimize volume and glucose control. K 5.1 on 4/29. Patient at baseline uses extended release po meds which he is unable to take po at this time for his Chron's.  Past Medical History  Past Medical History:  Diagnosis Date  . A-fib ([place])   . Adenocarcinoma of left lung, stage 1 ([place]) 03/10/2015  . Ankylosing spondylitis ([place])    Diagnosed during lumbar fracture summer of 2016    . Crohn's disease ([place])   . Gout   . HIstory of basal cell cancer of face    THYROID CA HX  . History of kidney stones   . Hypertension   . Hypothyroidism   . Impotence   . Insulin  dependent diabetes mellitus with renal manifestation   . Obesity (BMI 30-39.9)   . Osteoporosis    Pt completed 5 years of fosamax in 2013     . Prostate cancer with recurrence    Treated with prostatectomy with recurrence 2012 with Lupron treatment now him   . Psoriasis   . Thyroid cancer ([place]) 1999   Treated with RAI and total thyroidectomy   . Type II diabetes mellitus (HCC)     Family History  family history includes CAD in his mother.  Prior Rehab/Hospitalizations:  Has the patient had prior rehab or hospitalizations prior to admission? yes  Has the patient had major surgery during 100 days prior to admission? Yes  Current Medications   Current Facility-Administered Medications:  .  0.9 %  sodium chloride infusion, , Intravenous, PRN, Eustace Moore, MD, Last Rate: 10 mL/hr at 09/23/20 1109, 250 mL at 09/23/20 1109 .  acetaminophen (TYLENOL) 160 MG/5ML solution 500 mg, 500 mg, Per Tube, Q6H, Eustace Moore, MD, 500 mg at 10/06/20 7209 .  acetaminophen (TYLENOL) tablet 650 mg, 650 mg, Oral, Q4H PRN, 650 mg at 09/26/20 1230 **OR** acetaminophen (TYLENOL) suppository 650 mg, 650 mg, Rectal, Q4H PRN, Eustace Moore, MD .  acetylcysteine (MUCOMYST) 20 % nebulizer / oral solution 600 mg, 600 mg, Nebulization, BID, Dahal, Binaya, MD, 600 mg at 10/06/20 0827 .  albuterol (PROVENTIL) (2.5 MG/3ML) 0.083% nebulizer solution 2.5 mg, 2.5 mg, Nebulization, Q4H PRN, Dahal, Binaya, MD, 2.5 mg at 10/05/20 1943 .  ALPRAZolam Duanne Moron) tablet 0.5 mg, 0.5 mg, Per Tube, BID PRN, Kipp Brood, MD, 0.5 mg at 10/03/20 1702 .  atenolol (TENORMIN) tablet 25 mg, 25 mg, Per NG tube, BID, Erick Colace, NP, 25 mg at 10/06/20 0926 .  bethanechol (URECHOLINE) tablet 5 mg, 5 mg, Per NG tube, QID, Erick Colace, NP, 5 mg at 10/06/20 0925 .  budesonide (PULMICORT) nebulizer solution 0.5 mg, 0.5 mg, Nebulization, BID, Eustace Moore, MD, 0.5 mg at 10/06/20 0826 .  busPIRone (BUSPAR) tablet 15 mg, 15 mg, Per  Tube, BID, Eustace Moore, MD, 15 mg at 10/06/20 0925 .  chlorhexidine (PERIDEX) 0.12 % solution 15 mL, 15 mL, Mouth/Throat, BID, Dahal, Binaya, MD, 15 mL at 10/06/20 0927 .  cholestyramine (QUESTRAN) packet 4 g, 4 g, Per Tube, BID AC, Dahal, Binaya, MD, 4 g at 10/05/20 1717 .  diltiazem (CARDIZEM) 10 mg/ml oral suspension 30 mg, 30 mg, Per Tube, Q6H, Erick Colace, NP, 30 mg at 10/06/20 4709 .  diphenhydrAMINE (BENADRYL) capsule 25 mg, 25 mg, Per NG tube, QHS PRN, Dahal, Binaya, MD, 25 mg at 10/03/20 2343 .  feeding supplement (NEPRO  CARB STEADY) liquid 1,000 mL, 1,000 mL, Per Tube, Continuous, Dahal, Binaya, MD, Last Rate: 50 mL/hr at 10/05/20 1643, 1,000 mL at 10/05/20 1643 .  feeding supplement (PROSource TF) liquid 45 mL, 45 mL, Per Tube, Daily, Dahal, Binaya, MD, 45 mL at 10/06/20 0925 .  fiber (NUTRISOURCE FIBER) 1 packet, 1 packet, Per Tube, BID, Erick Colace, NP, 1 packet at 10/06/20 [phone] .  free water 200 mL, 200 mL, Per Tube, Q6H, Chand, Sudham, MD, 200 mL at 10/06/20 0600 .  Gerhardt's butt cream, , Topical, BID, Kipp Brood, MD, 1 application at 28/31/51 0926 .  guaiFENesin-dextromethorphan (ROBITUSSIN DM) 100-10 MG/5ML syrup 5 mL, 5 mL, Per Tube, Q8H, Dahal, Binaya, MD, 5 mL at 10/06/20 0622 .  insulin aspart (novoLOG) injection 0-20 Units, 0-20 Units, Subcutaneous, Q4H, Agarwala, Ravi, MD, 3 Units at 10/06/20 0900 .  insulin glargine (LANTUS) injection 12 Units, 12 Units, Subcutaneous, BID, Dahal, Marlowe Aschoff, MD, 12 Units at 10/06/20 0922 .  levothyroxine (SYNTHROID) tablet 125 mcg, 125 mcg, Per Tube, QAC breakfast, Eustace Moore, MD, 125 mcg at 10/06/20 0622 .  loperamide HCl (IMODIUM) 1 MG/7.5ML suspension 2 mg, 2 mg, Per Tube, BID PRN, Margaretha Seeds, MD, 2 mg at 09/25/20 2338 .  loperamide HCl (IMODIUM) 1 MG/7.5ML suspension 4 mg, 4 mg, Per Tube, QID, Dahal, Binaya, MD, 4 mg at 10/05/20 2151 .  MEDLINE mouth rinse, 15 mL, Mouth Rinse, 10 times per day, Eustace Moore, MD,  15 mL at 10/06/20 7616 .  nutrition supplement (JUVEN) (JUVEN) powder packet 1 packet, 1 packet, Per Tube, BID BM, Margaretha Seeds, MD, 1 packet at 10/06/20 [phone] .  ondansetron (ZOFRAN) injection 4 mg, 4 mg, Intravenous, Q6H PRN, Margaretha Seeds, MD, 4 mg at 09/24/20 1208 .  oxyCODONE (ROXICODONE) 5 MG/5ML solution 5 mg, 5 mg, Per Tube, Q4H PRN, Dahal, Binaya, MD, 5 mg at 10/05/20 1718 .  phenol (CHLORASEPTIC) mouth spray 1 spray, 1 spray, Mouth/Throat, PRN, Bowser, Grace E, NP, 1 spray at 09/25/20 1019 .  revefenacin (YUPELRI) nebulizer solution 175 mcg, 175 mcg, Nebulization, Daily, Margaretha Seeds, MD, 175 mcg at 10/05/20 0757  Patients Current Diet:  Diet Order            Diet NPO time specified  Diet effective now                 Precautions / Restrictions Precautions Precautions: Fall,Other (comment),Back Precaution Booklet Issued: No Precaution Comments: rectal tube, trach collar, cortrak Spinal Brace: Thoracolumbosacral orthotic,Applied in sitting position Restrictions Weight Bearing Restrictions: No   Has the patient had 2 or more falls or a fall with injury in the past year?Yes  Prior Activity Level Community (5-7x/wk): independent, active and driving  Prior Functional Level Prior Function Level of Independence: Independent  Self Care: Did the patient need help bathing, dressing, using the toilet or eating?  Independent  Indoor Mobility: Did the patient need assistance with walking from room to room (with or without device)? Independent  Stairs: Did the patient need assistance with internal or external stairs (with or without device)? Independent  Functional Cognition: Did the patient need help planning regular tasks such as shopping or remembering to take medications? Independent  Home Assistive Devices / Equipment Home Assistive Devices/Equipment: None  Prior Device Use: Indicate devices/aids used by the patient prior to current illness, exacerbation  or injury? None of the above  Current Functional Level Cognition  Overall Cognitive Status: Difficult to assess Difficult to  assess due to: Tracheostomy (PMV donned for part of session) Orientation Level: Intubated/Tracheostomy - Unable to assess Following Commands: Follows one step commands with increased time General Comments: Requiring increased time. Fatigues quickly and then can become flat.    Extremity Assessment (includes Sensation/Coordination)  Upper Extremity Assessment: RUE deficits/detail,LUE deficits/detail RUE Deficits / Details: edema noted with weeping, skin tear noted on the medial side of the inner arm. RUE Coordination: decreased fine motor LUE Deficits / Details: noted to have edema with weeping. pt with pocket of fluid under blood pressure cuff above elbow LUE Coordination: decreased fine motor,decreased gross motor  Lower Extremity Assessment: Defer to PT evaluation RLE Deficits / Details: Difficulty obtaining full AROM knee extension against gravity or obtaining full hip and knee extension in standing without physical assistance RLE Coordination: decreased gross motor LLE Deficits / Details: Difficulty obtaining full AROM knee extension against gravity or obtaining full hip and knee extension in standing without physical assistance LLE Coordination: decreased gross motor    ADLs  Overall ADL's : Needs assistance/impaired Eating/Feeding: NPO Grooming: Maximal assistance,Sitting Lower Body Bathing: Maximal assistance,Sit to/from stand Lower Body Bathing Details (indicate cue type and reason): Max A for washing feet Upper Body Dressing : Moderate assistance,Sitting Upper Body Dressing Details (indicate cue type and reason): Pt partially donning brace. Requiring Mod A for positioning and stabilizing brace Lower Body Dressing: Minimal assistance Lower Body Dressing Details (indicate cue type and reason): Using figure four to don sock. Min A for intiating over  toes Toilet Transfer: Minimal assistance,+2 for safety/equipment,Ambulation,RW (simulated to recliner) Toilet Transfer Details (indicate cue type and reason): MIn A for support and RW management. Functional mobility during ADLs: Rolling walker,Cueing for safety,Minimal assistance,+2 for physical assistance,+2 for safety/equipment General ADL Comments: Pt performing LB dressing, brace management, and functional mobility in room    Mobility  Overal bed mobility: Needs Assistance Bed Mobility: Rolling,Sidelying to Sit Rolling: Min assist Sidelying to sit: Min assist,HOB elevated Supine to sit: +2 for physical assistance,Mod assist,HOB elevated Sit to supine: +2 for physical assistance,Mod assist Sit to sidelying: Min assist General bed mobility comments: Min A for elevating trunk    Transfers  Overall transfer level: Needs assistance Equipment used: Rolling walker (2 wheeled) Transfers: Sit to/from Stand Sit to Stand: [employer] safety/equipment Stand pivot transfers: Min assist,+2 physical assistance,+2 safety/equipment General transfer comment: min +2 for rise, steady, and lines/leads. Pt with posterior bias in standing, corrected with continued standing    Ambulation / Gait / Stairs / Wheelchair Mobility  Ambulation/Gait Ambulation/Gait assistance: Min assist,+2 safety/equipment Gait Distance (Feet): 12 Feet (+ 77f) Assistive device: Rolling walker (2 wheeled) Gait Pattern/deviations: Step-through pattern,Decreased stride length,Shuffle,Trunk flexed,Narrow base of support General Gait Details: minA at times to steady, cues for positioning in RW and max encouragement. low SpO2 (to 70s) with gait, improved with increase in O2 from 35% and 8L to 40% and 10L. cues for posture and positioning in RW Gait velocity: decr Gait velocity interpretation: <1.31 ft/sec, indicative of household ambulator    Posture / Balance Dynamic Sitting Balance Sitting balance - Comments: Pt with  preference for UE support on bed. Balance Overall balance assessment: Needs assistance Sitting-balance support: Bilateral upper extremity supported,Feet supported,Single extremity supported Sitting balance-Leahy Scale: Fair Sitting balance - Comments: Pt with preference for UE support on bed. Standing balance support: Bilateral upper extremity supported,During functional activity Standing balance-Leahy Scale: Poor Standing balance comment: Reliant on bil UE support and external assist.    Special needs/care consideration #6  flexible cuffed trach placed 4/26 43 inch 10 fr left nare placed 4/11 Rectal tube ; history of Chron's and unable to take his home po medication regimen   Previous Home Environment  Living Arrangements: Spouse/significant other  Lives With: Spouse Type of Home: House Home Layout: One level Home Access: Stairs to enter [employer] Shower/Tub: Multimedia programmer: Handicapped height Bathroom Accessibility: Yes [place]: No Additional Comments: daughter present at time of this eval.  Discharge Living Setting Plans for Discharge Living Setting: Patient's home,Lives with (comment) (wife) Type of Home at Discharge: House Discharge Home Layout: One level Discharge Home Access: Stairs to enter Entrance Stairs-Rails: Right Entrance Stairs-Number of Steps: 3 Discharge Bathroom Shower/Tub: Walk-in shower Discharge Bathroom Toilet: Handicapped height Discharge Bathroom Accessibility: Yes How Accessible: Accessible via walker Does the patient have any problems obtaining your medications?: No  Social/Family/Support Systems Contact Information: wife and 2 daughters Anticipated Caregiver: wife and 2 daughters Anticipated Caregiver's Contact Information: see above Ability/Limitations of Caregiver: none Caregiver Availability: 24/7 Discharge Plan Discussed with Primary Caregiver: Yes Is Caregiver In Agreement with Plan?: Yes Does Caregiver/Family  have Issues with Lodging/Transportation while Pt is in Rehab?: No  Goals Patient/Family Goal for Rehab: supervision to min asisst PT, OT and SLP Expected length of stay: ELOS 2 to 3 weeks Pt/Family Agrees to Admission and willing to participate: Yes Program Orientation Provided & Reviewed with Pt/Caregiver Including Roles  & Responsibilities: Yes  Decrease burden of Care through IP rehab admission: n/a  Possible need for SNF placement upon discharge:not anticipated  Patient Condition: This patient's medical and functional status has changed since the consult dated:09/27/2020 in which the Rehabilitation Physician determined and documented that the patient's condition is appropriate for intensive rehabilitative care in an inpatient rehabilitation facility. See "History of Present Illness" (above) for medical update. Functional changes are: mod assist. Patient's medical and functional status update has been discussed with the Rehabilitation physician and patient remains appropriate for inpatient rehabilitation. Will admit to inpatient rehab today.  Preadmission Screen Completed By:  Cleatrice Burke, RN, 10/06/2020 10:51 AM ______________________________________________________________________   Discussed status with Dr. Ranell Patrick on 10/06/2020 at  1055 and received approval for admission today.  Admission Coordinator:  Cleatrice Burke, time 4462 Date 10/06/2020

## 2020-10-05 NOTE — Progress Notes (Addendum)
Occupational Therapy Treatment Patient Details Name: Devin [name] MRN: [id number] DOB: 1940/11/23 Today's Date: 10/05/2020    History of present illness 80 yo male who presented 4/3 s/p fell [place] a bookstore 3/31 and obtained T11 chance fracture, stable L1 compression fracture, extensive lumbar spondylosis and postsurgical changes. Additionally had left 7th rib fx anteriorly and left pleural effusion. Pt intubated in ER on 4/3 for agitation and hypoxemia. 4/8 s/p rod connection for T11 fx. 4/9 failed attempt extubation.  4/12 trach placement intubated 4/3.  PMH restrictive lung disease, emphysema/COPD adenocarcinoma s/p LUL lobectomy Crohn's, Psoriasis, Afib, nephrolithiasis, HTN, DM, obesity, thyroid cancer s/p RAI and total thyroidectomy (1999), and prostate cancer s/p prostatectomy (2012) but with recurrence   OT comments  Pt progressing towards established OT goals. Pt very motivated to participate in therapy. Pt performing bed mobility with Min A. Pt donning socks at EOB with Min A and using figure four method. Pt donning brace with Mod A and cues for sequencing. Pt performing functional mobility in room with Min A +1 and RW. Presenting with fatigue and required seated rest break. SpO2 dropping on RA (see general comments); however, pt quickly recovered on trach collar. Continue to recommend dc to CIR and will continue to follow acute as admitted.    Follow Up Recommendations  CIR    Equipment Recommendations  3 in 1 bedside commode;Other (comment) (RW)    Recommendations for Other Services Rehab consult    Precautions / Restrictions Precautions Precautions: Fall;Other (comment);Back Precaution Booklet Issued: No Precaution Comments: rectal tube, trach collar, cortrak Required Braces or Orthoses: Spinal Brace Spinal Brace: Thoracolumbosacral orthotic;Applied in sitting position       Mobility Bed Mobility Overal bed mobility: Needs Assistance Bed Mobility: Rolling;Sidelying to  Sit Rolling: Min assist Sidelying to sit: Min assist;HOB elevated       General bed mobility comments: Min A for elevating trunk    Transfers Overall transfer level: Needs assistance Equipment used: Rolling walker (2 wheeled) Transfers: Sit to/from Stand Sit to Stand: Min assist;+2 safety/equipment         General transfer comment: min +2 for rise, steady, and lines/leads. Pt with posterior bias in standing, corrected with continued standing    Balance Overall balance assessment: Needs assistance Sitting-balance support: Bilateral upper extremity supported;Feet supported;Single extremity supported Sitting balance-Leahy Scale: Fair Sitting balance - Comments: Pt with preference for UE support on bed.   Standing balance support: Bilateral upper extremity supported;During functional activity Standing balance-Leahy Scale: Poor Standing balance comment: Reliant on bil UE support and external assist.                           ADL either performed or assessed with clinical judgement   ADL Overall ADL's : Needs assistance/impaired Eating/Feeding: NPO               Upper Body Dressing : Moderate assistance;Sitting Upper Body Dressing Details (indicate cue type and reason): Pt partially donning brace. Requiring Mod A for positioning and stabilizing brace Lower Body Dressing: Minimal assistance Lower Body Dressing Details (indicate cue type and reason): Using figure four to don sock. Min A for intiating over toes Toilet Transfer: Minimal assistance;+2 for safety/equipment;Ambulation;RW (simulated to recliner) Toilet Transfer Details (indicate cue type and reason): MIn A for support and RW management.         Functional mobility during ADLs: Rolling walker;Cueing for safety;Minimal assistance;+2 for physical assistance;+2 for safety/equipment General ADL Comments: Pt  performing LB dressing, brace management, and functional mobility in room     Vision        Perception     Praxis      Cognition Arousal/Alertness: Awake/alert Behavior During Therapy: WFL for tasks assessed/performed Overall Cognitive Status: Difficult to assess Area of Impairment: Memory;Following commands                     Memory: Decreased recall of precautions Following Commands: Follows one step commands with increased time       General Comments: Requiring increased time. Fatigues quickly and then can become flat.        Exercises     Shoulder Instructions       General Comments   Wife and daughter present during session. SpO2 dropping to 85% (poor pleth) on RA. Placed back on 8L at 35% and pt quickly returning to 90s. however, pt presenting with fatigue and feeling SOB, elevating O2 concentration to 40% and 10L for 2nd bout of ambulation. pt continues to report fatigue and SOB but recovered to90s quickly with seated rest.     Pertinent Vitals/ Pain       Pain Assessment: Faces Faces Pain Scale: Hurts even more Pain Location: back moving stand>sit Pain Descriptors / Indicators: Sore;Grimacing;Discomfort Pain Intervention(s): Monitored during session;Limited activity within patient's tolerance;Repositioned  Home Living                                          Prior Functioning/Environment              Frequency  Min 2X/week        Progress Toward Goals  OT Goals(current goals can now be found in the care plan section)  Progress towards OT goals: Progressing toward goals  Acute Rehab OT Goals Patient Stated Goal: to walk OT Goal Formulation: With patient/family Time For Goal Achievement: 10/17/20 Potential to Achieve Goals: Good ADL Goals Pt Will Perform Grooming: with set-up;with supervision;sitting Pt Will Perform Upper Body Bathing: with min assist;sitting Pt Will Transfer to Toilet: with min assist;ambulating;bedside commode;with +2 assist Additional ADL Goal #1: Pt will perform bed mobility with Min A  in preparation for ADLs Additional ADL Goal #2: pt will don brace min level as precursor to adls.  Plan Discharge plan remains appropriate    Co-evaluation    PT/OT/SLP Co-Evaluation/Treatment: Yes Reason for Co-Treatment: For patient/therapist safety;To address functional/ADL transfers   OT goals addressed during session: ADL's and self-care      AM-PAC OT "6 Clicks" Daily Activity     Outcome Measure   Help from another person eating meals?: Total Help from another person taking care of personal grooming?: A Lot Help from another person toileting, which includes using toliet, bedpan, or urinal?: A Lot Help from another person bathing (including washing, rinsing, drying)?: A Lot Help from another person to put on and taking off regular upper body clothing?: A Lot Help from another person to put on and taking off regular lower body clothing?: A Lot 6 Click Score: 11    End of Session Equipment Utilized During Treatment: Oxygen;Back brace;Rolling walker  OT Visit Diagnosis: Unsteadiness on feet (R26.81);Muscle weakness (generalized) (M62.81)   Activity Tolerance Patient tolerated treatment well   Patient Left with call bell/phone within reach;in chair;with chair alarm set;with family/visitor present   Nurse Communication Mobility status;Precautions  Time: 1601-1630 OT Time Calculation (min): 29 min  Charges: OT Treatments $Self Care/Home Management : 8-22 mins  Nilsa Macht MSOT, OTR/L Acute Rehab Pager: [phone] Office: [place] 10/05/2020, 5:11 PM

## 2020-10-06 ENCOUNTER — Encounter (HOSPITAL_COMMUNITY): Payer: Self-pay | Admitting: Physical Medicine and Rehabilitation

## 2020-10-06 ENCOUNTER — Other Ambulatory Visit: Payer: Self-pay

## 2020-10-06 ENCOUNTER — Inpatient Hospital Stay (HOSPITAL_COMMUNITY)
Admission: RE | Admit: 2020-10-06 | Discharge: 2020-10-07 | DRG: 559 | Disposition: A | Discharge status: Other Acute Inpatient Hospital | Payer: Medicare Other | Source: Intra-hospital | Attending: Physical Medicine and Rehabilitation | Admitting: Physical Medicine and Rehabilitation

## 2020-10-06 DIAGNOSIS — R1310 Dysphagia, unspecified: Secondary | ICD-10-CM

## 2020-10-06 DIAGNOSIS — Z781 Physical restraint status: Secondary | ICD-10-CM

## 2020-10-06 DIAGNOSIS — Z8249 Family history of ischemic heart disease and other diseases of the circulatory system: Secondary | ICD-10-CM | POA: Diagnosis not present

## 2020-10-06 DIAGNOSIS — J386 Stenosis of larynx: Secondary | ICD-10-CM | POA: Diagnosis not present

## 2020-10-06 DIAGNOSIS — Z4659 Encounter for fitting and adjustment of other gastrointestinal appliance and device: Secondary | ICD-10-CM

## 2020-10-06 DIAGNOSIS — Z85828 Personal history of other malignant neoplasm of skin: Secondary | ICD-10-CM | POA: Diagnosis not present

## 2020-10-06 DIAGNOSIS — K5090 Crohn's disease, unspecified, without complications: Secondary | ICD-10-CM | POA: Diagnosis present

## 2020-10-06 DIAGNOSIS — S22088D Other fracture of T11-T12 vertebra, subsequent encounter for fracture with routine healing: Secondary | ICD-10-CM | POA: Diagnosis present

## 2020-10-06 DIAGNOSIS — G8929 Other chronic pain: Secondary | ICD-10-CM | POA: Diagnosis present

## 2020-10-06 DIAGNOSIS — G9340 Encephalopathy, unspecified: Secondary | ICD-10-CM | POA: Diagnosis present

## 2020-10-06 DIAGNOSIS — N1831 Chronic kidney disease, stage 3a: Secondary | ICD-10-CM | POA: Diagnosis present

## 2020-10-06 DIAGNOSIS — I129 Hypertensive chronic kidney disease with stage 1 through stage 4 chronic kidney disease, or unspecified chronic kidney disease: Secondary | ICD-10-CM | POA: Diagnosis present

## 2020-10-06 DIAGNOSIS — Z85118 Personal history of other malignant neoplasm of bronchus and lung: Secondary | ICD-10-CM

## 2020-10-06 DIAGNOSIS — Z7901 Long term (current) use of anticoagulants: Secondary | ICD-10-CM

## 2020-10-06 DIAGNOSIS — Z430 Encounter for attention to tracheostomy: Secondary | ICD-10-CM | POA: Diagnosis not present

## 2020-10-06 DIAGNOSIS — R0689 Other abnormalities of breathing: Secondary | ICD-10-CM | POA: Diagnosis present

## 2020-10-06 DIAGNOSIS — E890 Postprocedural hypothyroidism: Secondary | ICD-10-CM | POA: Diagnosis present

## 2020-10-06 DIAGNOSIS — J9503 Malfunction of tracheostomy stoma: Secondary | ICD-10-CM | POA: Diagnosis not present

## 2020-10-06 DIAGNOSIS — W19XXXD Unspecified fall, subsequent encounter: Secondary | ICD-10-CM | POA: Diagnosis present

## 2020-10-06 DIAGNOSIS — Z794 Long term (current) use of insulin: Secondary | ICD-10-CM | POA: Diagnosis not present

## 2020-10-06 DIAGNOSIS — R262 Difficulty in walking, not elsewhere classified: Secondary | ICD-10-CM | POA: Diagnosis not present

## 2020-10-06 DIAGNOSIS — G9341 Metabolic encephalopathy: Secondary | ICD-10-CM | POA: Diagnosis not present

## 2020-10-06 DIAGNOSIS — R451 Restlessness and agitation: Secondary | ICD-10-CM | POA: Diagnosis present

## 2020-10-06 DIAGNOSIS — L89101 Pressure ulcer of unspecified part of back, stage 1: Secondary | ICD-10-CM | POA: Diagnosis present

## 2020-10-06 DIAGNOSIS — Z87891 Personal history of nicotine dependence: Secondary | ICD-10-CM

## 2020-10-06 DIAGNOSIS — Z981 Arthrodesis status: Secondary | ICD-10-CM | POA: Diagnosis not present

## 2020-10-06 DIAGNOSIS — J690 Pneumonitis due to inhalation of food and vomit: Secondary | ICD-10-CM | POA: Diagnosis not present

## 2020-10-06 DIAGNOSIS — E11649 Type 2 diabetes mellitus with hypoglycemia without coma: Secondary | ICD-10-CM | POA: Diagnosis not present

## 2020-10-06 DIAGNOSIS — I482 Chronic atrial fibrillation, unspecified: Secondary | ICD-10-CM | POA: Diagnosis present

## 2020-10-06 DIAGNOSIS — J9622 Acute and chronic respiratory failure with hypercapnia: Secondary | ICD-10-CM | POA: Diagnosis present

## 2020-10-06 DIAGNOSIS — R221 Localized swelling, mass and lump, neck: Secondary | ICD-10-CM | POA: Diagnosis present

## 2020-10-06 DIAGNOSIS — N179 Acute kidney failure, unspecified: Secondary | ICD-10-CM | POA: Diagnosis present

## 2020-10-06 DIAGNOSIS — E875 Hyperkalemia: Secondary | ICD-10-CM | POA: Diagnosis present

## 2020-10-06 DIAGNOSIS — Z79899 Other long term (current) drug therapy: Secondary | ICD-10-CM

## 2020-10-06 DIAGNOSIS — E162 Hypoglycemia, unspecified: Secondary | ICD-10-CM | POA: Diagnosis present

## 2020-10-06 DIAGNOSIS — Z8546 Personal history of malignant neoplasm of prostate: Secondary | ICD-10-CM | POA: Diagnosis not present

## 2020-10-06 DIAGNOSIS — Z79890 Hormone replacement therapy (postmenopausal): Secondary | ICD-10-CM

## 2020-10-06 DIAGNOSIS — J449 Chronic obstructive pulmonary disease, unspecified: Secondary | ICD-10-CM | POA: Diagnosis present

## 2020-10-06 DIAGNOSIS — E1122 Type 2 diabetes mellitus with diabetic chronic kidney disease: Secondary | ICD-10-CM | POA: Diagnosis present

## 2020-10-06 DIAGNOSIS — S22080A Wedge compression fracture of T11-T12 vertebra, initial encounter for closed fracture: Secondary | ICD-10-CM | POA: Diagnosis not present

## 2020-10-06 DIAGNOSIS — Z9049 Acquired absence of other specified parts of digestive tract: Secondary | ICD-10-CM

## 2020-10-06 DIAGNOSIS — Z87442 Personal history of urinary calculi: Secondary | ICD-10-CM

## 2020-10-06 DIAGNOSIS — S22009S Unspecified fracture of unspecified thoracic vertebra, sequela: Secondary | ICD-10-CM | POA: Diagnosis not present

## 2020-10-06 DIAGNOSIS — Z930 Tracheostomy status: Secondary | ICD-10-CM | POA: Diagnosis not present

## 2020-10-06 DIAGNOSIS — Z7951 Long term (current) use of inhaled steroids: Secondary | ICD-10-CM

## 2020-10-06 DIAGNOSIS — R1312 Dysphagia, oropharyngeal phase: Secondary | ICD-10-CM | POA: Diagnosis not present

## 2020-10-06 DIAGNOSIS — F419 Anxiety disorder, unspecified: Secondary | ICD-10-CM | POA: Diagnosis present

## 2020-10-06 DIAGNOSIS — S22009A Unspecified fracture of unspecified thoracic vertebra, initial encounter for closed fracture: Secondary | ICD-10-CM | POA: Diagnosis present

## 2020-10-06 DIAGNOSIS — R4189 Other symptoms and signs involving cognitive functions and awareness: Secondary | ICD-10-CM | POA: Diagnosis present

## 2020-10-06 DIAGNOSIS — I4891 Unspecified atrial fibrillation: Secondary | ICD-10-CM | POA: Diagnosis present

## 2020-10-06 DIAGNOSIS — Z85850 Personal history of malignant neoplasm of thyroid: Secondary | ICD-10-CM | POA: Diagnosis not present

## 2020-10-06 DIAGNOSIS — E119 Type 2 diabetes mellitus without complications: Secondary | ICD-10-CM

## 2020-10-06 DIAGNOSIS — R5381 Other malaise: Secondary | ICD-10-CM | POA: Diagnosis not present

## 2020-10-06 DIAGNOSIS — R0902 Hypoxemia: Secondary | ICD-10-CM | POA: Diagnosis not present

## 2020-10-06 DIAGNOSIS — J9621 Acute and chronic respiratory failure with hypoxia: Secondary | ICD-10-CM | POA: Diagnosis not present

## 2020-10-06 LAB — CBC WITH DIFFERENTIAL/PLATELET
Abs Immature Granulocytes: 0.21 10*3/uL — ABNORMAL HIGH (ref 0.00–0.07)
Basophils Absolute: 0 10*3/uL (ref 0.0–0.1)
Basophils Relative: 0 %
Eosinophils Absolute: 0 10*3/uL (ref 0.0–0.5)
Eosinophils Relative: 0 %
HCT: 25.6 % — ABNORMAL LOW (ref 39.0–52.0)
Hemoglobin: 7.7 g/dL — ABNORMAL LOW (ref 13.0–17.0)
Immature Granulocytes: 1 %
Lymphocytes Relative: 6 %
Lymphs Abs: 1.1 10*3/uL (ref 0.7–4.0)
MCH: 29.6 pg (ref 26.0–34.0)
MCHC: 30.1 g/dL (ref 30.0–36.0)
MCV: 98.5 fL (ref 80.0–100.0)
Monocytes Absolute: 1.8 10*3/uL — ABNORMAL HIGH (ref 0.1–1.0)
Monocytes Relative: 11 %
Neutro Abs: 13.7 10*3/uL — ABNORMAL HIGH (ref 1.7–7.7)
Neutrophils Relative %: 82 %
Platelets: 554 10*3/uL — ABNORMAL HIGH (ref 150–400)
RBC: 2.6 MIL/uL — ABNORMAL LOW (ref 4.22–5.81)
RDW: 13.4 % (ref 11.5–15.5)
WBC: 16.8 10*3/uL — ABNORMAL HIGH (ref 4.0–10.5)
nRBC: 0 % (ref 0.0–0.2)

## 2020-10-06 LAB — BASIC METABOLIC PANEL
Anion gap: 7 (ref 5–15)
BUN: 77 mg/dL — ABNORMAL HIGH (ref 8–23)
CO2: 38 mmol/L — ABNORMAL HIGH (ref 22–32)
Calcium: 8.9 mg/dL (ref 8.9–10.3)
Chloride: 89 mmol/L — ABNORMAL LOW (ref 98–111)
Creatinine, Ser: 1.47 mg/dL — ABNORMAL HIGH (ref 0.61–1.24)
GFR, Estimated: 49 mL/min — ABNORMAL LOW (ref >60)
Glucose, Bld: 116 mg/dL — ABNORMAL HIGH (ref 70–99)
Potassium: 5.1 mmol/L (ref 3.5–5.1)
Sodium: 134 mmol/L — ABNORMAL LOW (ref 135–145)

## 2020-10-06 LAB — GLUCOSE, CAPILLARY
Glucose-Capillary: 110 mg/dL — ABNORMAL HIGH (ref 70–99)
Glucose-Capillary: 123 mg/dL — ABNORMAL HIGH (ref 70–99)
Glucose-Capillary: 99 mg/dL (ref 70–99)
Glucose-Capillary: 183 mg/dL — ABNORMAL HIGH (ref 70–99)

## 2020-10-06 MED ORDER — JUVEN PO PACK: 1.0000 | PACK | Freq: Two times a day (BID) | ORAL | Status: DC

## 2020-10-06 MED ORDER — LOPERAMIDE HCL 1 MG/7.5ML PO SUSP: 4.0000 mg | Freq: Four times a day (QID) | ORAL | 0 refills | Status: DC

## 2020-10-06 MED ORDER — ATENOLOL 25 MG PO TABS
25.0000 mg | ORAL_TABLET | Freq: Two times a day (BID) | ORAL | Status: DC
  Administered 2020-10-06: 25 mg via NASOGASTRIC
  Filled 2020-10-06: qty 1

## 2020-10-06 MED ORDER — ACETAMINOPHEN 160 MG/5ML PO SOLN
500.0000 mg | Freq: Four times a day (QID) | ORAL | Status: DC
  Administered 2020-10-06 – 2020-10-07 (×2): 500 mg
  Filled 2020-10-06 (×3): qty 20.3

## 2020-10-06 MED ORDER — CHLORHEXIDINE GLUCONATE 0.12 % MT SOLN
15.0000 mL | Freq: Two times a day (BID) | OROMUCOSAL | Status: DC
  Administered 2020-10-06: 15 mL via OROMUCOSAL
  Filled 2020-10-06: qty 15

## 2020-10-06 MED ORDER — BUSPIRONE HCL 5 MG PO TABS
15.0000 mg | ORAL_TABLET | Freq: Two times a day (BID) | ORAL | Status: DC
  Administered 2020-10-06: 15 mg
  Filled 2020-10-06: qty 3

## 2020-10-06 MED ORDER — BUDESONIDE 0.5 MG/2ML IN SUSP: 0.5000 mg | Freq: Two times a day (BID) | RESPIRATORY_TRACT | 12 refills | Status: AC

## 2020-10-06 MED ORDER — OXYCODONE HCL 5 MG/5ML PO SOLN: 5.0000 mg | ORAL | 0 refills | Status: DC | PRN

## 2020-10-06 MED ORDER — OXYCODONE HCL 5 MG/5ML PO SOLN
5.0000 mg | ORAL | Status: DC | PRN
Start: 2020-10-06 — End: 2020-10-07

## 2020-10-06 MED ORDER — REVEFENACIN 175 MCG/3ML IN SOLN
175.0000 ug | Freq: Every day | RESPIRATORY_TRACT | Status: DC
  Administered 2020-10-07: 175 ug via RESPIRATORY_TRACT
  Filled 2020-10-06: qty 3

## 2020-10-06 MED ORDER — FREE WATER: 100.0000 mL | Freq: Three times a day (TID) | Status: DC

## 2020-10-06 MED ORDER — FREE WATER
100.0000 mL | Freq: Three times a day (TID) | Status: DC
  Administered 2020-10-06: 100 mL

## 2020-10-06 MED ORDER — INSULIN GLARGINE 100 UNIT/ML ~~LOC~~ SOLN
12.0000 [IU] | Freq: Two times a day (BID) | SUBCUTANEOUS | Status: DC
  Administered 2020-10-06: 12 [IU] via SUBCUTANEOUS
  Filled 2020-10-06 (×3): qty 0.12

## 2020-10-06 MED ORDER — ALPRAZOLAM 0.5 MG PO TABS: 0.5000 mg | ORAL_TABLET | Freq: Two times a day (BID) | ORAL | Status: DC | PRN

## 2020-10-06 MED ORDER — BETHANECHOL CHLORIDE 10 MG PO TABS: 5.0000 mg | ORAL_TABLET | Freq: Four times a day (QID) | ORAL | Status: DC

## 2020-10-06 MED ORDER — ACETYLCYSTEINE 20 % IN SOLN: 600.0000 mg | Freq: Two times a day (BID) | RESPIRATORY_TRACT | 12 refills | Status: DC

## 2020-10-06 MED ORDER — SENNOSIDES-DOCUSATE SODIUM 8.6-50 MG PO TABS
1.0000 | ORAL_TABLET | Freq: Every evening | ORAL | Status: DC | PRN
Start: 2020-10-06 — End: 2020-10-07

## 2020-10-06 MED ORDER — PROCHLORPERAZINE EDISYLATE 10 MG/2ML IJ SOLN: 5.0000 mg | Freq: Four times a day (QID) | INTRAMUSCULAR | Status: DC | PRN

## 2020-10-06 MED ORDER — ALUMINUM HYDROXIDE GEL 320 MG/5ML PO SUSP
10.0000 mL | Freq: Four times a day (QID) | ORAL | Status: DC | PRN
  Filled 2020-10-06: qty 30

## 2020-10-06 MED ORDER — PROCHLORPERAZINE MALEATE 5 MG PO TABS: 5.0000 mg | ORAL_TABLET | Freq: Four times a day (QID) | ORAL | Status: DC | PRN

## 2020-10-06 MED ORDER — SODIUM CHLORIDE 0.9 % IV SOLN
250.0000 mg | Freq: Every day | INTRAVENOUS | Status: DC
  Administered 2020-10-06: 250 mg via INTRAVENOUS
  Filled 2020-10-06: qty 20

## 2020-10-06 MED ORDER — ALBUTEROL SULFATE (2.5 MG/3ML) 0.083% IN NEBU
2.5000 mg | INHALATION_SOLUTION | RESPIRATORY_TRACT | Status: DC | PRN
  Administered 2020-10-06 – 2020-10-07 (×2): 2.5 mg via RESPIRATORY_TRACT
  Filled 2020-10-06 (×2): qty 3

## 2020-10-06 MED ORDER — LOPERAMIDE HCL 1 MG/7.5ML PO SUSP
4.0000 mg | Freq: Four times a day (QID) | ORAL | Status: DC
  Administered 2020-10-06 (×2): 4 mg
  Filled 2020-10-06 (×7): qty 30

## 2020-10-06 MED ORDER — BISACODYL 10 MG RE SUPP: 10.0000 mg | Freq: Every day | RECTAL | Status: DC | PRN

## 2020-10-06 MED ORDER — INSULIN GLARGINE 100 UNIT/ML ~~LOC~~ SOLN
12.0000 [IU] | Freq: Two times a day (BID) | SUBCUTANEOUS | 11 refills | Status: DC
Start: 2020-10-06 — End: 2020-11-20

## 2020-10-06 MED ORDER — TRAZODONE HCL 50 MG PO TABS: 25.0000 mg | ORAL_TABLET | Freq: Every evening | ORAL | Status: DC | PRN

## 2020-10-06 MED ORDER — LIDOCAINE HCL URETHRAL/MUCOSAL 2 % EX GEL: CUTANEOUS | Status: DC | PRN

## 2020-10-06 MED ORDER — INSULIN ASPART 100 UNIT/ML ~~LOC~~ SOLN: 0.0000 [IU] | SUBCUTANEOUS | 11 refills | Status: DC

## 2020-10-06 MED ORDER — PROSOURCE TF PO LIQD: 45.0000 mL | Freq: Every day | ORAL | Status: DC

## 2020-10-06 MED ORDER — CHOLESTYRAMINE 4 G PO PACK: 4.0000 g | PACK | Freq: Two times a day (BID) | ORAL | 12 refills | Status: AC

## 2020-10-06 MED ORDER — PROCHLORPERAZINE 25 MG RE SUPP: 12.5000 mg | Freq: Four times a day (QID) | RECTAL | Status: DC | PRN

## 2020-10-06 MED ORDER — INSULIN ASPART 100 UNIT/ML ~~LOC~~ SOLN
0.0000 [IU] | SUBCUTANEOUS | Status: DC
  Administered 2020-10-06: 4 [IU] via SUBCUTANEOUS

## 2020-10-06 MED ORDER — DILTIAZEM 12 MG/ML ORAL SUSPENSION: 30.0000 mg | Freq: Four times a day (QID) | ORAL | Status: AC

## 2020-10-06 MED ORDER — ORAL CARE MOUTH RINSE
15.0000 mL | OROMUCOSAL | Status: DC
  Administered 2020-10-06 – 2020-10-07 (×4): 15 mL via OROMUCOSAL

## 2020-10-06 MED ORDER — NEPRO/CARBSTEADY PO LIQD
1000.0000 mL | ORAL | Status: DC
  Administered 2020-10-06 (×2): 1000 mL
  Filled 2020-10-06 (×3): qty 1000

## 2020-10-06 MED ORDER — DILTIAZEM 12 MG/ML ORAL SUSPENSION
30.0000 mg | Freq: Four times a day (QID) | ORAL | Status: DC
  Administered 2020-10-06 – 2020-10-07 (×2): 30 mg
  Filled 2020-10-06 (×5): qty 3

## 2020-10-06 MED ORDER — REVEFENACIN 175 MCG/3ML IN SOLN: 175.0000 ug | Freq: Every day | RESPIRATORY_TRACT | Status: DC

## 2020-10-06 MED ORDER — LEVOTHYROXINE SODIUM 25 MCG PO TABS
125.0000 ug | ORAL_TABLET | Freq: Every day | ORAL | Status: DC
  Filled 2020-10-06: qty 1

## 2020-10-06 MED ORDER — DIPHENHYDRAMINE HCL 25 MG PO CAPS: 25.0000 mg | ORAL_CAPSULE | Freq: Every evening | ORAL | 0 refills | Status: DC | PRN

## 2020-10-06 MED ORDER — MILK AND MOLASSES ENEMA
1.0000 | Freq: Every day | RECTAL | Status: DC | PRN
  Filled 2020-10-06: qty 240

## 2020-10-06 MED ORDER — BUDESONIDE 0.5 MG/2ML IN SUSP
0.5000 mg | Freq: Two times a day (BID) | RESPIRATORY_TRACT | Status: DC
  Administered 2020-10-06 – 2020-10-07 (×2): 0.5 mg via RESPIRATORY_TRACT
  Filled 2020-10-06 (×2): qty 2

## 2020-10-06 MED ORDER — BETHANECHOL CHLORIDE 10 MG PO TABS
10.0000 mg | ORAL_TABLET | Freq: Four times a day (QID) | ORAL | Status: DC
  Administered 2020-10-06 – 2020-10-07 (×2): 10 mg via NASOGASTRIC
  Filled 2020-10-06 (×4): qty 1

## 2020-10-06 MED ORDER — SODIUM CHLORIDE 0.9 % IV SOLN
250.0000 mg | Freq: Every day | INTRAVENOUS | Status: DC
  Filled 2020-10-06 (×2): qty 20

## 2020-10-06 MED ORDER — ACETYLCYSTEINE 20 % IN SOLN
600.0000 mg | Freq: Two times a day (BID) | RESPIRATORY_TRACT | Status: DC
  Administered 2020-10-07: 600 mg via RESPIRATORY_TRACT
  Filled 2020-10-06 (×3): qty 4

## 2020-10-06 MED ORDER — ONDANSETRON HCL 4 MG/2ML IJ SOLN: 4.0000 mg | Freq: Four times a day (QID) | INTRAMUSCULAR | 0 refills | Status: DC | PRN

## 2020-10-06 MED ORDER — LOPERAMIDE HCL 1 MG/7.5ML PO SUSP: 2.0000 mg | Freq: Two times a day (BID) | ORAL | 0 refills | Status: DC | PRN

## 2020-10-06 MED ORDER — GERHARDT'S BUTT CREAM
TOPICAL_CREAM | Freq: Two times a day (BID) | CUTANEOUS | Status: DC
  Filled 2020-10-06: qty 1

## 2020-10-06 MED ORDER — BETHANECHOL CHLORIDE 5 MG PO TABS: 5.0000 mg | ORAL_TABLET | Freq: Four times a day (QID) | ORAL | Status: DC

## 2020-10-06 MED ORDER — SODIUM CHLORIDE 0.9 % IV SOLN: 250.0000 mg | Freq: Every day | INTRAVENOUS | Status: DC

## 2020-10-06 MED ORDER — GUAIFENESIN-DM 100-10 MG/5ML PO SYRP: 5.0000 mL | ORAL_SOLUTION | Freq: Four times a day (QID) | ORAL | Status: DC | PRN

## 2020-10-06 MED ORDER — NUTRISOURCE FIBER PO PACK
1.0000 | PACK | Freq: Two times a day (BID) | ORAL | Status: DC
  Administered 2020-10-06: 1
  Filled 2020-10-06 (×3): qty 1

## 2020-10-06 MED ORDER — JUVEN PO PACK: 1.0000 | PACK | Freq: Two times a day (BID) | ORAL | 0 refills | Status: DC

## 2020-10-06 MED ORDER — GUAIFENESIN-DM 100-10 MG/5ML PO SYRP
5.0000 mL | ORAL_SOLUTION | Freq: Three times a day (TID) | ORAL | Status: DC
  Administered 2020-10-06: 5 mL
  Filled 2020-10-06 (×2): qty 5

## 2020-10-06 MED ORDER — DIPHENHYDRAMINE HCL 25 MG PO CAPS: 25.0000 mg | ORAL_CAPSULE | Freq: Every evening | ORAL | Status: DC | PRN

## 2020-10-06 MED ORDER — CHOLESTYRAMINE 4 G PO PACK
4.0000 g | PACK | Freq: Two times a day (BID) | ORAL | Status: DC
  Administered 2020-10-06: 4 g
  Filled 2020-10-06 (×3): qty 1

## 2020-10-06 MED ORDER — ONDANSETRON HCL 4 MG/2ML IJ SOLN: 4.0000 mg | Freq: Four times a day (QID) | INTRAMUSCULAR | Status: DC | PRN

## 2020-10-06 MED ORDER — ACETAMINOPHEN 160 MG/5ML PO SOLN: 500.0000 mg | Freq: Four times a day (QID) | ORAL | 0 refills | Status: DC

## 2020-10-06 MED ORDER — NUTRISOURCE FIBER PO PACK
1.0000 | PACK | Freq: Two times a day (BID) | ORAL | Status: DC
Start: 2020-10-06 — End: 2020-11-20

## 2020-10-06 MED ORDER — GUAIFENESIN-DM 100-10 MG/5ML PO SYRP: 5.0000 mL | ORAL_SOLUTION | Freq: Three times a day (TID) | ORAL | 0 refills | Status: DC

## 2020-10-06 MED ORDER — DIPHENHYDRAMINE HCL 12.5 MG/5ML PO ELIX: 12.5000 mg | ORAL_SOLUTION | Freq: Four times a day (QID) | ORAL | Status: DC | PRN

## 2020-10-06 MED ORDER — NEPRO/CARBSTEADY PO LIQD: 1000.0000 mL | ORAL | 0 refills | Status: DC

## 2020-10-06 MED ORDER — GERHARDT'S BUTT CREAM: 1.0000 "application " | TOPICAL_CREAM | Freq: Two times a day (BID) | CUTANEOUS | Status: DC

## 2020-10-06 MED ORDER — LOPERAMIDE HCL 1 MG/7.5ML PO SUSP
2.0000 mg | Freq: Two times a day (BID) | ORAL | Status: DC | PRN
  Filled 2020-10-06: qty 15

## 2020-10-06 MED ORDER — PHENOL 1.4 % MT LIQD: 1.0000 | OROMUCOSAL | 0 refills | Status: DC | PRN

## 2020-10-06 NOTE — Progress Notes (Addendum)
[facility] Progress Note    Assessment/ Plan:   Pt is a 80 y.o. yo male with a PMHX of T2DM, COPD, atrial fibrillation (Eliquis), Crohn's disease, lung cancer s/p LUL lobectomy (2016), thyroidectomy due to thyroid cancer, ankylosing spondylitis was admitted to [facility] on 09/10/2020 for dyspnea requiring tracheostomy on 09/19/2020.   Hyperkalemia - Potassium 5.1 this morning. Improved with Kayexalate, IV Lasix, insulin and sodium bicarb.  Patient transition to Nepro tube feeds.  Continue to optimize glucose control and volume.  CKD stage IIIA: Baseline 1.3-1.4.  Today 1.47.  Acute hypoxic respiratory failure  COPD restrictive lung disease secondary to scoliosis and ankylosing spondylitis -tracheostomy on 4/12.  Plan per primary - optimize volume as above  Klebsiella pneumonia -on ceftriaxone.  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 Has NG tube. ENT performed biopsy yesterday.  - transitioned to nepro as above  Diarrhea in the setting of Crohn's disease- on Imodium.  Avoid dehydration.  T2DM -glucose 116 today.  Optimize glucose control will help with K load.  Normocytic anemia - Patient has low iron stores.  On admission, hemoglobin 12.8 and now 8.2.  Transfuse as needed  History of hypertension - Hypotensive.  On atenolol and diltiazem.  Atrial fibrillation -On atenolol and diltiazem.  Rate controlled.   Acute T11 fracture - Pt fell on 3/31 and found to have new T11 fracture and left 7th rib fracture. He failed outpatient treatment and presented to the ED. He had T11-T12 stabilization on 4/8 by neurosurgery.  Plan per primary.  Hypothyroidism-Per primary  History of adenocarcinoma of the lung, thyroid and prostate cancer -follow-up outpatient  DVT PPX -Eliquis held due to surgery, restart when able     Subjective:   Patient had chest PT this morning.  He is having difficulty with his secretions.   Daughter at bedside.   Objective:   BP (!) 109/52 (BP Location: Right Arm)   Pulse 70   Temp 97.8 F (36.6 C) (Oral)   Resp 18   Ht 5' 9.02" (1.753 m)   Wt 89.3 kg   SpO2 93%   BMI 29.06 kg/m   Intake/Output Summary (Last 24 hours) at 10/06/2020 0901 Last data filed at 10/06/2020 5093 Gross per 24 hour  Intake 1533.17 ml  Output 930 ml  Net 603.17 ml   Weight change:   Physical Exam: Gen: Pleasant elderly male coughing due to secretions, NG tube CVS: Regular rate and rhythm Resp: Rhonchi anteriorly, tracheostomy in place Abd: Soft, nontender Ext: 1+ lower extremity edema  Imaging: DG Abd 1 View  Result Date: 10/04/2020 CLINICAL DATA:  Feeding tube placement. EXAM: ABDOMEN - 1 VIEW COMPARISON:  09/16/2020. FINDINGS: Interim removal of NG tube and placement of feeding tube. Feeding tube tip noted over the stomach. Basilar atelectasis. Left pleural effusion cannot be excluded. Postsurgical changes thoracolumbar spine. Surgical clips noted over the abdomen. IMPRESSION: Interim removal of NG tube and placement of feeding tube. Feeding tube tip noted over the stomach. Electronically Signed   By: Marcello Moores  Register   On: 10/04/2020 12:23    Labs: BMET Recent Labs  Lab 10/01/20 2671 10/02/20 2458 10/03/20 0154 10/03/20 1511 10/05/20 0236 10/05/20 0755 10/05/20 1200 10/05/20 1646 10/06/20 0151  NA 133* 130* 129*  --  131* 130* 133*  --  134*  K 5.2* 5.5* 5.1  --  6.2* 7.0* 6.3* 5.4* 5.1  CL 94* 92* 89*  --  92* 91* 89*  --  89*  CO2 30 33* 32  --  31 32 36*  --  38*  GLUCOSE 206* 188* 183*  --  235* 360* 269*  --  116*  BUN 50* 48* 47*  --  56* 60* 66*  --  77*  CREATININE 1.15 1.13 1.20  --  1.34* 1.44* 1.49*  --  1.47*  CALCIUM 8.5* 8.6* 8.6*  --  8.5* 8.5* 8.5*  --  8.9  PHOS  --   --   --  3.7  --   --   --   --   --    CBC Recent Labs  Lab 10/02/20 0748 10/03/20 0154 10/05/20 0236 10/06/20 0151  WBC 12.3* 13.0* 15.8* 16.8*  NEUTROABS 10.0* 10.8* 15.0* 13.7*   HGB 7.7* 7.8* 8.2* 7.7*  HCT 24.9* 24.9* 26.5* 25.6*  MCV 94.3 94.0 95.3 98.5  PLT 378 400 499* 554*    Medications:    . acetaminophen (TYLENOL) oral liquid 160 mg/5 mL  500 mg Per Tube Q6H  . acetylcysteine  600 mg Nebulization BID  . atenolol  25 mg Per NG tube BID  . bethanechol  5 mg Per NG tube QID  . budesonide (PULMICORT) nebulizer solution  0.5 mg Nebulization BID  . busPIRone  15 mg Per Tube BID  . chlorhexidine  15 mL Mouth/Throat BID  . cholestyramine  4 g Per Tube BID AC  . diltiazem  30 mg Per Tube Q6H  . feeding supplement (PROSource TF)  45 mL Per Tube Daily  . fiber  1 packet Per Tube BID  . free water  200 mL Per Tube Q6H  . Gerhardt's butt cream   Topical BID  . guaiFENesin-dextromethorphan  5 mL Per Tube Q8H  . insulin aspart  0-20 Units Subcutaneous Q4H  . insulin glargine  12 Units Subcutaneous BID  . levothyroxine  125 mcg Per Tube QAC breakfast  . loperamide HCl  4 mg Per Tube QID  . mouth rinse  15 mL Mouth Rinse 10 times per day  . nutrition supplement (JUVEN)  1 packet Per Tube BID BM  . revefenacin  175 mcg Nebulization Daily      Kelyn Koskela, DO  10/06/20  9:01 AM

## 2020-10-06 NOTE — Progress Notes (Signed)
Inpatient Rehabilitation Admissions Coordinator  I met with patient and daughter, [name], at bedside. They are in agreement to admit to CIR today . I have contacted Dr. Sophronia Simas for medical clearance and notified acute team and TOC of plans. I will make the arrangements to admit today.  Danne Baxter, RN, MSN Rehab Admissions Coordinator [phone] 10/06/2020 10:57 AM

## 2020-10-06 NOTE — Progress Notes (Signed)
Devin Arn, MD  Physician  Physical Medicine and Rehabilitation  Consult Note     Signed  Date of Service:  09/27/2020  8:49 AM      Related encounter: ED to Hosp-Admission (Discharged) from 09/10/2020 in [place]       Signed      Expand All Collapse All     Show:Clear all [x] Manual[x] Template[] Copied  Added by: [x] Love, Ivan Anchors, PA-C[x] Devin Arn, MD   [] Hover for details           Physical Medicine and Rehabilitation Consult     Reason for Consult: Functional deficits Referring Physician: Dr. Pietro Cassis   HPI: Devin [name] is a 80 y.o. male with history of A fib, Crohn's, lung cancer s/p RSXN, thyroid cancer, prostate cancer,  T2DM, COPD, ankylosing spondylitis with chronic LBP and gait disorder who sustained a fall on 09/07/2020 with progressive pain.  History taken from chart review and patient due to trach.  He was admitted on 09/10/2020 with ongoing pain, shortness of breath, and agitation. He became unresponsive in ED requiring intubation for acute on chronic hypercarbic respiratory failure. CT chest revealed acute T11 vertebral fracture and TLSO recommended by Dr. Ronnald Ramp. Once cleared by PCCM, he underwent posterior thoracic fusion T11-T12 on 09/15/2020 he was unable to tolerate extubation X 2 despite decadron to help with edema and ultimately required tracheostomy on 09/19/2020.  Hospital course further complicated by atrial fibrillation with RVR, copious secretions,  Kleb PNA, fluid overload, diarrhea due to crohn's. He was started on weaning trials and has been off vent since 04/18. He was started on PMSV trials and therapy ongoing. CIR recommended due to debility.   Review of Systems  Constitutional: Negative for chills and fever.  HENT: Negative for hearing loss and tinnitus.   Eyes: Negative for blurred vision and double vision.  Respiratory: Positive for sputum production. Negative for cough and shortness of breath.    Cardiovascular: Negative for chest pain.  Gastrointestinal: Positive for diarrhea. Negative for heartburn and nausea.  Genitourinary: Negative for dysuria and urgency.  Skin: Negative for itching and rash.  Neurological: Positive for weakness. Negative for dizziness and headaches.  Psychiatric/Behavioral: The patient does not have insomnia.   All other systems reviewed and are negative.       Past Medical History:  Diagnosis Date  . A-fib ([place])    . Adenocarcinoma of left lung, stage 1 ([place]) 03/10/2015  . Ankylosing spondylitis ([place])      Diagnosed during lumbar fracture summer of 2016    . Crohn's disease ([place])    . Gout    . HIstory of basal cell cancer of face      THYROID CA HX  . History of kidney stones    . Hypertension    . Hypothyroidism    . Impotence    . 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 Obesity (BMI 30-39.9)    . Osteoporosis      Pt completed 5 years of fosamax in 2013     . Prostate cancer with recurrence      Treated with prostatectomy with recurrence 2012 with Lupron treatment now him   . Psoriasis    . Thyroid cancer ([place]) 1999    Treated with RAI and total thyroidectomy   . Type II diabetes mellitus ([place])             Past Surgical History:  Procedure Laterality Date  .  ABDOMINAL EXPLORATION SURGERY        for small bowel obstruction  . APPENDECTOMY   03/2007  . BACK SURGERY      . BASAL CELL CARCINOMA EXCISION   "several"    "head"  . CARDIAC CATHETERIZATION   03/17/2003  . CHOLECYSTECTOMY N/A 05/07/2016    Procedure: LAPAROSCOPIC CHOLECYSTECTOMY;  Surgeon: Fanny Skates, MD;  Location: [facility];  Service: General;  Laterality: N/A;  . COLON SURGERY   03/2007    Resection of cecum, appendix, terminal ileum (approximately/notes 10/10/2010  . CYSTOSCOPY/URETEROSCOPY/HOLMIUM LASER/STENT PLACEMENT Right 07/03/2020    Procedure: CYSTOSCOPY/RETROGRADE/URETEROSCOPY REMOVAL OF BLADDER STONE;  Surgeon: Raynelle Bring, MD;   Location: [facility];  Service: Urology;  Laterality: Right;  . HERNIA REPAIR      . LAMINECTOMY WITH POSTERIOR LATERAL ARTHRODESIS LEVEL 1 N/A 09/15/2020    Procedure: REVISION OF THORACOLUMBAR FUSION, ADDITION OF CROSS-CONNECTORS;  Surgeon: Eustace Moore, MD;  Location: [facility];  Service: Neurosurgery;  Laterality: N/A;  . LAPAROSCOPIC CHOLECYSTECTOMY   05/07/2016  . LAPAROSCOPIC LYSIS OF ADHESIONS   05/07/2016  . LAPAROSCOPIC LYSIS OF ADHESIONS N/A 05/07/2016    Procedure: LAPAROSCOPIC LYSIS OF ADHESIONS TIMES ONE HOUR;  Surgeon: Fanny Skates, MD;  Location: [facility];  Service: General;  Laterality: N/A;  . POSTERIOR FUSION THORACIC SPINE   02/08/2016    1. Posterior thoracic arthrodesis T7-T11 utilizing morcellized allograft, 2. Posterior thoracic segmental fixation T7-T11 utilizing nuvasive pedicle screws  . PROSTATECTOMY   06/2001    w/bilateral pelvic lymph nose dissection/notes 10/24/2010  . SPINAL FUSION   12/2014    Open reduction internal fixation of L1 Chance fracture with posterior fusion T10-L4 utilizing morcellized allograft and some local autograft, segmental instrumentation T10-L4 inclusive utilizing nuvasive pedicle screws/notes 12/16/2014  . Stress Cardiolite   02/17/2003  . THOROCOTOMY WITH LOBECTOMY   03/16/2015    Procedure: THOROCOTOMY WITH LOBECTOMY;  Surgeon: Ivin Poot, MD;  Location: [facility];  Service: Thoracic;;  . TONSILLECTOMY      . TOTAL THYROIDECTOMY   1997  . Venous Doppler   05/30/2004  . VENTRAL HERNIA REPAIR   04/14/2008  . VIDEO ASSISTED THORACOSCOPY Left 03/16/2015    Procedure: VIDEO ASSISTED THORACOSCOPY;  Surgeon: Ivin Poot, MD;  Location: [facility];  Service: Thoracic;  Laterality: Left;           Family History  Problem Relation Age of Onset  . CAD Mother    . Cancer Neg Hx          No cancer in the patient's immediate family, except of course for the patient himself, as noted      Social History:  Married. Retired. Independent without AD. Per   reports that he quit smoking about 30 years ago. His smoking use included cigarettes. He has a 60.00 pack-year smoking history. He has never used smokeless tobacco. He reports previous alcohol use. He reports that he does not use drugs.      Allergies: No Known Allergies            Medications Prior to Admission  Medication Sig Dispense Refill  . albuterol (VENTOLIN HFA) 108 (90 Base) MCG/ACT inhaler Inhale 2 puffs into the lungs every 6 (six) hours as needed for wheezing or shortness of breath. 8 g 6  . ALPRAZolam (XANAX) 0.5 MG tablet Take 0.5 mg by mouth 2 (two) times daily as needed for anxiety.      Marland Kitchen amoxicillin-clavulanate (AUGMENTIN) 875-125 MG tablet Take 1  tablet by mouth every 12 (twelve) hours.      Marland Kitchen apixaban (ELIQUIS) 5 MG TABS tablet Take 5 mg by mouth 2 (two) times daily.      Marland Kitchen atenolol (TENORMIN) 25 MG tablet Take 1 tablet (25 mg total) by mouth 2 (two) times daily.      . busPIRone (BUSPAR) 15 MG tablet Take 15 mg by mouth 2 (two) times daily.       . calcium-vitamin D (OSCAL WITH D) 500-200 MG-UNIT tablet Take 2 tablets by mouth daily.       . hydrochlorothiazide (HYDRODIURIL) 12.5 MG tablet Take 12.5 mg by mouth every morning.      Marland Kitchen HYDROcodone-acetaminophen (NORCO/VICODIN) 5-325 MG tablet Take 1 tablet by mouth every 6 (six) hours as needed (pain).      . insulin lispro (HUMALOG KWIKPEN) 100 UNIT/ML KiwkPen INJECT UNDER THE SKIN THREE TIMES DAILY( 25 UNITS, 25 UNITS AND 20 UNITS RESPECTIVELY BEFORE MEALS) (Patient taking differently: Inject 20-30 Units into the skin See admin instructions. Inject 25 units subcutaneously before breakfast, 20 units before lunch and 30 units before supper) 30 mL 5  . levothyroxine (SYNTHROID) 125 MCG tablet Take 1 tablet (125 mcg total) by mouth daily before breakfast. 30 tablet 0  . Mesalamine 800 MG TBEC Take 1,600 mg by mouth 2 (two) times daily.      . methocarbamol (ROBAXIN) 500 MG tablet Take 500 mg by mouth every 8 (eight) hours.       . Multiple Vitamins-Iron (DAILY-VITE/IRON/BETA-CAROTENE) TABS Take 1 tablet by mouth daily.       . Tiotropium Bromide Monohydrate (SPIRIVA RESPIMAT) 2.5 MCG/ACT AERS Inhale 2 puffs into the lungs daily. 4 g 0  . zinc gluconate 50 MG tablet Take 50 mg by mouth daily.      . B-D ULTRAFINE III SHORT PEN 31G X 8 MM MISC USE 1 PEN NEEDLE FOUR TIMES DAILY. 100 each 0  . diltiazem (CARDIZEM) 30 MG tablet Take 1 tablet every 4 hours AS NEEDED for heart rate >100 as long as blood pressure >100. (Patient not taking: No sig reported) 45 tablet 1  . Glucose Blood (ASCENSIA CONTOUR TEST VI) Use as directed to test two times a day      . glucose blood (BAYER CONTOUR TEST) test strip TEST BLOOD SUGAR TWICE DAILY AS DIRECTED. 200 each 2  . glucose blood test strip 1 each by Other route 2 times daily.      . Insulin Pen Needle 31G X 8 MM MISC Inject 31 g into the skin.      Marland Kitchen leuprolide (LUPRON) 11.25 MG injection Inject 11.25 mg into the muscle every 6 (six) months.           Home: Home Living Family/patient expects to be discharged to:: Inpatient rehab Living Arrangements: Spouse/significant other Additional Comments: daughter present at time of this eval.  Functional History: Prior Function Level of Independence: Independent Functional Status:  Mobility: Bed Mobility Overal bed mobility: Needs Assistance Bed Mobility: Rolling,Sidelying to Sit Rolling: Min assist Sidelying to sit: Min assist Supine to sit: +2 for physical assistance,Mod assist,HOB elevated Sit to supine: +2 for physical assistance,Mod assist Sit to sidelying: Min assist General bed mobility comments: Min A for elevating trunk Transfers Overall transfer level: Needs assistance Equipment used: Rolling walker (2 wheeled) Transfers: Sit to/from [employer] Sit to Stand: [employer] safety/equipment Stand pivot transfers: Min assist,+2 physical assistance,+2 safety/equipment General transfer comment: Min A for  power up into  standing, Min A +2 for gaining balance Ambulation/Gait Ambulation/Gait assistance: Min assist,+2 physical assistance,+2 safety/equipment Gait Distance (Feet): 10 Feet Assistive device: Rolling walker (2 wheeled) Gait Pattern/deviations: Step-through pattern,Decreased stride length,Shuffle,Trunk flexed,Narrow base of support General Gait Details: min assist +2 for steadying, managing RW, lines/leads; VC for upright posture, placement in RW Gait velocity: decr Gait velocity interpretation: <1.31 ft/sec, indicative of household ambulator   ADL: ADL Overall ADL's : Needs assistance/impaired Eating/Feeding: NPO Grooming: Maximal assistance,Sitting Lower Body Bathing: Maximal assistance,Sit to/from stand Lower Body Bathing Details (indicate cue type and reason): Max A for washing feet Upper Body Dressing : Maximal assistance,Sitting Upper Body Dressing Details (indicate cue type and reason): Max A to don new brace while sitting at EOB Lower Body Dressing: Moderate assistance,Sit to/from stand,With adaptive equipment Lower Body Dressing Details (indicate cue type and reason): don socks with AE. Pt doffing socks with reacher. Mod A for managing sock aide. Toilet Transfer: Minimal assistance,+2 for physical assistance,+2 for safety/equipment,Ambulation,RW (simulated to recliner) Toilet Transfer Details (indicate cue type and reason): MIn A for support and RW management. Functional mobility during ADLs: Rolling walker,Cueing for safety,Minimal assistance,+2 for physical assistance,+2 for safety/equipment General ADL Comments: Pt performing functional mobility in room and then practicing LB dressing with AE   Cognition: Cognition Overall Cognitive Status: Difficult to assess Orientation Level: Oriented X4 Cognition Arousal/Alertness: Awake/alert Behavior During Therapy: WFL for tasks assessed/performed Overall Cognitive Status: Difficult to assess General Comments: Following one  step commands. Slightly flat, borderline irritable during certain tasks. Pt does make jokes occasionally during session Difficult to assess due to: Tracheostomy     Blood pressure 136/63, pulse 73, temperature 97.9 F (36.6 C), temperature source Oral, resp. rate (!) 26, height 5' 9.02" (1.753 m), weight 82.8 kg, SpO2 96 %. Physical Exam Vitals and nursing note reviewed.  Constitutional:      General: He is not in acute distress.    Comments: Congested,  productive cough --able to suction independently.   HENT:     Head: Normocephalic and atraumatic.     Comments: + NG    Right Ear: External ear normal.     Left Ear: External ear normal.     Nose: Nose normal.  Eyes:     General:        Right eye: No discharge.        Left eye: No discharge.     Extraocular Movements: Extraocular movements intact.  Neck:     Comments: Cuffed #8 trach in place with thick yellow secretions. .  Cardiovascular:     Rate and Rhythm: Normal rate and regular rhythm.  Pulmonary:     Effort: Pulmonary effort is normal. No respiratory distress.     Breath sounds: No stridor.  Abdominal:     General: Abdomen is flat. Bowel sounds are normal. There is no distension.  Musculoskeletal:     Comments: No edema or tenderness in extremities  Skin:    General: Skin is warm and dry.  Neurological:     Mental Status: He is alert.     Comments: Alert Motor: Bilateral upper extremities: 5/5 proximal distal Right lower extremity: 4+/5 proximal distally Left lower extremity: 4--4/5 proximal to distal (pain inhibition)  Psychiatric:        Mood and Affect: Mood normal.        Behavior: Behavior normal.        Lab Results Last 24 Hours       Results for orders placed or performed  during the hospital encounter of 09/10/20 (from the past 24 hour(s))  Glucose, capillary     Status: Abnormal    Collection Time: 09/26/20 11:36 AM  Result Value Ref Range    Glucose-Capillary 132 (H) 70 - 99 mg/dL  Glucose,  capillary     Status: Abnormal    Collection Time: 09/26/20  4:45 PM  Result Value Ref Range    Glucose-Capillary 127 (H) 70 - 99 mg/dL  Glucose, capillary     Status: Abnormal    Collection Time: 09/26/20  7:41 PM  Result Value Ref Range    Glucose-Capillary 144 (H) 70 - 99 mg/dL  Glucose, capillary     Status: Abnormal    Collection Time: 09/26/20 10:49 PM  Result Value Ref Range    Glucose-Capillary 129 (H) 70 - 99 mg/dL  Glucose, capillary     Status: Abnormal    Collection Time: 09/27/20  4:49 AM  Result Value Ref Range    Glucose-Capillary 166 (H) 70 - 99 mg/dL  Glucose, capillary     Status: Abnormal    Collection Time: 09/27/20  7:54 AM  Result Value Ref Range    Glucose-Capillary 109 (H) 70 - 99 mg/dL      Imaging Results (Last 48 hours)  No results found.     Assessment/Plan: Diagnosis: Debility Labs independently reviewed.  Records reviewed and summated above.   1. Does the need for close, 24 hr/day medical supervision in concert with the patient's rehab needs make it unreasonable for this patient to be served in a less intensive setting? Yes  2. Co-Morbidities requiring supervision/potential complications:  A fib (monitor heart rate with increased activity, anticoagulation), Crohn's, lung cancer s/p RSXN, thyroid cancer, prostate cancer,  T2 DM (Monitor in accordance with exercise and adjust meds as necessary), COPD (Monitor for signs of respiratory distress. Provide oxygen with goals 88 - 92% if needed. Augment therapies as needed based off endurance. 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 Due to bowel management, safety, skin/wound care, disease management, pain management and patient education, does the patient require 24 hr/day rehab nursing? Yes 4. Does the patient require coordinated care of a physician, rehab nurse, therapy disciplines of PT/OT/SLP to address  physical and functional deficits in the context of the above medical diagnosis(es)? Yes Addressing deficits in the following areas: balance, endurance, locomotion, strength, transferring, bathing, dressing, toileting, speech, swallowing and psychosocial support 5. Can the patient actively participate in an intensive therapy program of at least 3 hrs of therapy per day at least 5 days per week? Yes 6. The potential for patient to make measurable gains while on inpatient rehab is excellent 7. Anticipated functional outcomes upon discharge from inpatient rehab are supervision and min assist  with PT, supervision and min assist with OT, modified independent with SLP. 8. Estimated rehab length of stay to reach the above functional goals is: 12-16 days. 9. Anticipated discharge destination: Home 10. Overall Rehab/Functional Prognosis: excellent and good   RECOMMENDATIONS: This patient's condition is appropriate for continued rehabilitative care in the following setting: CIR Patient has agreed to participate in recommended program. Yes Note that insurance prior authorization may be required for reimbursement for recommended care.   Comment: Rehab Admissions Coordinator to follow up.   I have personally performed a face to face diagnostic evaluation, including, but not limited to relevant history and physical exam findings, of this patient and developed relevant assessment and  plan.  Additionally, I have reviewed and concur with the physician assistant's documentation above.    Delice Lesch, MD, ABPMR Bary Leriche, PA-C 09/27/2020          Revision History                             Routing History                   Note Details  Author Devin Arn, MD File Time 09/27/2020 12:05 PM  Author Type Physician Status Signed  Last Editor Devin Arn, MD Service Physical Medicine and [place] # [id number] Admit Date 10/06/2020

## 2020-10-06 NOTE — H&P (Signed)
Physical Medicine and Rehabilitation Admission H&P    Chief Complaint  Patient presents with  . Functional deficits due to T11 chance Fx with respiratory failure/diarrhe    HPI: Devin [name] is a 80 year old male with history of T2DM, A fib (on eliquis), COPD/restirctive lund disease, lung cancer, prostate cancer, thyroid cancer, anklylosing spondylitis with chronic LBP who sustained a fall on 09/07/20 wit progressive pain. He was admitted on 09/10/20 with uncontrolled pain, shortness of breath and agitation.  He became unresponsive in ED requiring intubation for acute on chronic hypercarbic respiratory failure.  CT chest revealed acute T11 vertebral fracture and TLSO was recommended by Dr. Ronnald Ramp.  Once cleared by PCCM, he underwent posterior thoracic fusion T11-T12 on 04/08.  He was unable to tolerate extubation despite addition of Decadron to help manage edema and ultimately required tracheostomy on 09/19/2020.    Dr. Hilarie Fredrickson consulted 04/13  due to issues with diarrhea and GI panel negative. Pentasa clogged his feeding tube therefore was discontinued with recommendations to resume when taking p.o.'s.  Probiotic as well as lomotil recommended to manage symptoms.  Hospital course further complicated by A. fib with RVR, copious secretions via trach, Kleb pneumonia--> treated with ceftriaxone, fluid overload, ongoing diarrhea requiring rectal tube.  He was started on weaning trials and extubated to ATC by 04/18.  MBS done on 04/22 revealing prevertebral tissue AC 4-5.  CT of head showed soft tissue mass at level of supraglottic larynx measuring 2.6 x 1.0 x 2.8 cm which was concerning for malignant neoplasm. Dr. Eligha Bridegroom consulted and patient underwent laryngoscopy with biopsy on 04/27 -->path pending.   Dr. Royce Macadamia consulted on 04/28 for hyperkalemia with peaked T waves. This improved with Kayexalate, lokelma, IV lasix, insulin and sodium bicarb. Tube feed was changed to nephro and he  received extra water flushes for volume loss from diarrhea. He continues to have copious secretions via trach and mucomyst added.  Rectal tube remains in place due to ongoing diarrhea.  Respiratory status improving but he continues to have DOE/SOB with activity requiring with saturations down to 70s and requires 10 L oxygen to recover.  He continues to have difficulty mobilizing secretions and chest PT ongoing-->water flushes decreased by nephrology today. Therapy ongoing and patient continues to be limited by weakness, SOB, balance deficits, cognitive deficits and required increased time to follow one step commands. CIR recommended due to functional decline.    Review of Systems  Constitutional: Negative.   HENT: Positive for congestion.   Eyes: Negative.   Respiratory: Positive for sputum production and shortness of breath.   Cardiovascular: Negative.   Gastrointestinal: Positive for diarrhea.  Genitourinary: Negative.   Musculoskeletal: Negative.   Skin: Negative.   Neurological: Positive for weakness.  Endo/Heme/Allergies: Negative.   Psychiatric/Behavioral: Negative.       Past Medical History:  Diagnosis Date  . A-fib ([place])   . Adenocarcinoma of left lung, stage 1 ([place]) 03/10/2015  . Ankylosing spondylitis ([place])    Diagnosed during lumbar fracture summer of 2016    . Crohn's disease ([place])   . Gout   . HIstory of basal cell cancer of face    THYROID CA HX  . History of kidney stones   . Hypertension   . Hypothyroidism   . Impotence   . 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 Obesity (BMI 30-39.9)   . Osteoporosis    Pt completed 5 years of fosamax in 2013     .  Prostate cancer with recurrence    Treated with prostatectomy with recurrence 2012 with Lupron treatment now him   . Psoriasis   . Thyroid cancer ([place]) 1999   Treated with RAI and total thyroidectomy   . Type II diabetes mellitus ([place])     Past Surgical History:  Procedure Laterality  Date  . ABDOMINAL EXPLORATION SURGERY     for small bowel obstruction  . APPENDECTOMY  03/2007  . BACK SURGERY    . BASAL CELL CARCINOMA EXCISION  "several"   "head"  . CARDIAC CATHETERIZATION  03/17/2003  . CHOLECYSTECTOMY N/A 05/07/2016   Procedure: LAPAROSCOPIC CHOLECYSTECTOMY;  Surgeon: Fanny Skates, MD;  Location: [facility];  Service: General;  Laterality: N/A;  . COLON SURGERY  03/2007   Resection of cecum, appendix, terminal ileum (approximately/notes 10/10/2010  . CYSTOSCOPY/URETEROSCOPY/HOLMIUM LASER/STENT PLACEMENT Right 07/03/2020   Procedure: CYSTOSCOPY/RETROGRADE/URETEROSCOPY REMOVAL OF BLADDER STONE;  Surgeon: Raynelle Bring, MD;  Location: [facility];  Service: Urology;  Laterality: Right;  . DIRECT LARYNGOSCOPY N/A 10/04/2020   Procedure: DIRECT LARYNGOSCOPY, PHARYNGOSCOPY WITH BIOPSY;  Surgeon: [name], DO;  Location: [facility];  Service: ENT;  Laterality: N/A;  . ESOPHAGOSCOPY N/A 10/04/2020   Procedure: ESOPHAGOSCOPY WITH BIOPSY;  Surgeon: [name], DO;  Location: [facility];  Service: ENT;  Laterality: N/A;  . HERNIA REPAIR    . LAMINECTOMY WITH POSTERIOR LATERAL ARTHRODESIS LEVEL 1 N/A 09/15/2020   Procedure: REVISION OF THORACOLUMBAR FUSION, ADDITION OF CROSS-CONNECTORS;  Surgeon: Eustace Moore, MD;  Location: [facility];  Service: Neurosurgery;  Laterality: N/A;  . LAPAROSCOPIC CHOLECYSTECTOMY  05/07/2016  . LAPAROSCOPIC LYSIS OF ADHESIONS  05/07/2016  . LAPAROSCOPIC LYSIS OF ADHESIONS N/A 05/07/2016   Procedure: LAPAROSCOPIC LYSIS OF ADHESIONS TIMES ONE HOUR;  Surgeon: Fanny Skates, MD;  Location: [facility];  Service: General;  Laterality: N/A;  . POSTERIOR FUSION THORACIC SPINE  02/08/2016   1. Posterior thoracic arthrodesis T7-T11 utilizing morcellized allograft, 2. Posterior thoracic segmental fixation T7-T11 utilizing nuvasive pedicle screws  . PROSTATECTOMY  06/2001   w/bilateral pelvic lymph nose dissection/notes 10/24/2010  . SPINAL FUSION  12/2014   Open  reduction internal fixation of L1 Chance fracture with posterior fusion T10-L4 utilizing morcellized allograft and some local autograft, segmental instrumentation T10-L4 inclusive utilizing nuvasive pedicle screws/notes 12/16/2014  . Stress Cardiolite  02/17/2003  . THOROCOTOMY WITH LOBECTOMY  03/16/2015   Procedure: THOROCOTOMY WITH LOBECTOMY;  Surgeon: Ivin Poot, MD;  Location: [facility];  Service: Thoracic;;  . TONSILLECTOMY    . TOTAL THYROIDECTOMY  1997  . Venous Doppler  05/30/2004  . VENTRAL HERNIA REPAIR  04/14/2008  . VIDEO ASSISTED THORACOSCOPY Left 03/16/2015   Procedure: VIDEO ASSISTED THORACOSCOPY;  Surgeon: Ivin Poot, MD;  Location: [facility];  Service: Thoracic;  Laterality: Left;   Family History  Problem Relation Age of Onset  . CAD Mother   . Cancer Neg Hx        No cancer in the patient's immediate family, except of course for the patient himself, as noted    Social History:  reports that he quit smoking about 30 years ago. His smoking use included cigarettes. He has a 60.00 pack-year smoking history. He has never used smokeless tobacco. He reports previous alcohol use. He reports that he does not use drugs.    Allergies: No Known Allergies    Medications Prior to Admission  Medication Sig Dispense Refill  . acetaminophen (TYLENOL) 160 MG/5ML solution Place 15.6 mLs (500 mg total)  into feeding tube every 6 (six) hours. 120 mL 0  . acetylcysteine (MUCOMYST) 20 % nebulizer solution Take 3 mLs (600 mg total) by nebulization 2 (two) times daily. 30 mL 12  . albuterol (VENTOLIN HFA) 108 (90 Base) MCG/ACT inhaler Inhale 2 puffs into the lungs every 6 (six) hours as needed for wheezing or shortness of breath. 8 g 6  . ALPRAZolam (XANAX) 0.5 MG tablet Take 0.5 mg by mouth 2 (two) times daily as needed for anxiety.    Marland Kitchen apixaban (ELIQUIS) 5 MG TABS tablet Take 5 mg by mouth 2 (two) times daily.    Marland Kitchen atenolol (TENORMIN) 25 MG tablet Take 1 tablet (25 mg total) by mouth 2 (two)  times daily.    . B-D ULTRAFINE III SHORT PEN 31G X 8 MM MISC USE 1 PEN NEEDLE FOUR TIMES DAILY. 100 each 0  . bethanechol (URECHOLINE) 5 MG tablet 1 tablet (5 mg total) by Per NG tube route 4 (four) times daily.    . budesonide (PULMICORT) 0.5 MG/2ML nebulizer solution Take 2 mLs (0.5 mg total) by nebulization 2 (two) times daily.  12  . busPIRone (BUSPAR) 15 MG tablet Take 15 mg by mouth 2 (two) times daily.     . calcium-vitamin D (OSCAL WITH D) 500-200 MG-UNIT tablet Take 2 tablets by mouth daily.     . cholestyramine (QUESTRAN) 4 g packet Place 1 packet (4 g total) into feeding tube 2 (two) times daily before lunch and supper. 60 each 12  . diltiazem (CARDIZEM) 10 mg/ml oral suspension Place 3 mLs (30 mg total) into feeding tube every 6 (six) hours.    . diphenhydrAMINE (BENADRYL) 25 mg capsule 1 capsule (25 mg total) by Per NG tube route at bedtime as needed for sleep. 30 capsule 0  . ferric gluconate 250 mg in sodium chloride 0.9 % 100 mL Inject 250 mg into the vein daily.    . fiber (NUTRISOURCE FIBER) PACK packet Place 1 packet into feeding tube 2 (two) times daily.    . Glucose Blood (ASCENSIA CONTOUR TEST VI) Use as directed to test two times a day    . glucose blood (BAYER CONTOUR TEST) test strip TEST BLOOD SUGAR TWICE DAILY AS DIRECTED. 200 each 2  . glucose blood test strip 1 each by Other route 2 times daily.    Marland Kitchen guaiFENesin-dextromethorphan (ROBITUSSIN DM) 100-10 MG/5ML syrup Place 5 mLs into feeding tube every 8 (eight) hours. 118 mL 0  . HYDROcodone-acetaminophen (NORCO/VICODIN) 5-325 MG tablet Take 1 tablet by mouth every 6 (six) hours as needed (pain).    . insulin aspart (NOVOLOG) 100 UNIT/ML injection Inject 0-20 Units into the skin every 4 (four) hours. 10 mL 11  . insulin glargine (LANTUS) 100 UNIT/ML injection Inject 0.12 mLs (12 Units total) into the skin 2 (two) times daily. 10 mL 11  . leuprolide (LUPRON) 11.25 MG injection Inject 11.25 mg into the muscle every 6 (six)  months.     . levothyroxine (SYNTHROID) 125 MCG tablet Take 1 tablet (125 mcg total) by mouth daily before breakfast. 30 tablet 0  . loperamide HCl (IMODIUM) 1 MG/7.5ML suspension Place 15 mLs (2 mg total) into feeding tube 2 (two) times daily as needed for diarrhea or loose stools. 120 mL 0  . loperamide HCl (IMODIUM) 1 MG/7.5ML suspension Place 30 mLs (4 mg total) into feeding tube 4 (four) times daily. 120 mL 0  . Mesalamine 800 MG TBEC Take 1,600 mg by mouth 2 (two)  times daily.    . methocarbamol (ROBAXIN) 500 MG tablet Take 500 mg by mouth every 8 (eight) hours.    . Multiple Vitamins-Iron (DAILY-VITE/IRON/BETA-CAROTENE) TABS Take 1 tablet by mouth daily.     . nutrition supplement, JUVEN, (JUVEN) PACK Place 1 packet into feeding tube 2 (two) times daily between meals.  0  . Nutritional Supplements (FEEDING SUPPLEMENT, NEPRO CARB STEADY,) LIQD Place 1,000 mLs into feeding tube continuous.  0  . [START ON 10/07/2020] Nutritional Supplements (FEEDING SUPPLEMENT, PROSOURCE TF,) liquid Place 45 mLs into feeding tube daily.    Marland Kitchen Nystatin (GERHARDT'S BUTT CREAM) CREA Apply 1 application topically 2 (two) times daily.    . ondansetron (ZOFRAN) 4 MG/2ML SOLN injection Inject 2 mLs (4 mg total) into the vein every 6 (six) hours as needed for nausea or vomiting. 2 mL 0  . oxyCODONE (ROXICODONE) 5 MG/5ML solution Place 5 mLs (5 mg total) into feeding tube every 4 (four) hours as needed for moderate pain.  0  . phenol (CHLORASEPTIC) 1.4 % LIQD Use as directed 1 spray in the mouth or throat as needed for throat irritation / pain.  0  . [START ON 10/07/2020] revefenacin (YUPELRI) 175 MCG/3ML nebulizer solution Take 3 mLs (175 mcg total) by nebulization daily. 90 mL   . Tiotropium Bromide Monohydrate (SPIRIVA RESPIMAT) 2.5 MCG/ACT AERS Inhale 2 puffs into the lungs daily. 4 g 0  . Water For Irrigation, Sterile (FREE WATER) SOLN Place 100 mLs into feeding tube every 8 (eight) hours.    Marland Kitchen zinc gluconate 50 MG  tablet Take 50 mg by mouth daily.      Drug Regimen Review  Drug regimen was reviewed and remains appropriate with no significant issues identified  Home:     Functional History:    Functional Status:  Mobility:          ADL:    Cognition:       Blood pressure (!) 114/54, pulse 71, temperature 98.4 F (36.9 C), resp. rate 18, weight 93.3 kg, SpO2 99 %. Physical Exam  Gen: no distress, normal appearing, BMI 30.36 HEENT: oral mucosa pink and moist, NCAT, tracheostomy tube with thick copious secretions Cardio: Reg rate Chest: normal effort, normal rate of breathing Abd: soft, non-distended Ext: bilateral lower extremity 2+ edema Psych: pleasant, normal affect Skin: intact Neuro: Alert and oriented x3. Nods head and moves lips in answer to questions Musculoskeletal: 5/5 strength throughout  Results for orders placed or performed during the hospital encounter of 09/10/20 (from the past 48 hour(s))  Glucose, capillary     Status: None   Collection Time: 10/04/20  7:08 PM  Result Value Ref Range   Glucose-Capillary 78 70 - 99 mg/dL    Comment: Glucose reference range applies only to samples taken after fasting for at least 8 hours.  Glucose, capillary     Status: Abnormal   Collection Time: 10/04/20 11:46 PM  Result Value Ref Range   Glucose-Capillary 151 (H) 70 - 99 mg/dL    Comment: Glucose reference range applies only to samples taken after fasting for at least 8 hours.  CBC with Differential/Platelet     Status: Abnormal   Collection Time: 10/05/20  2:36 AM  Result Value Ref Range   WBC 15.8 (H) 4.0 - 10.5 K/uL   RBC 2.78 (L) 4.22 - 5.81 MIL/uL   Hemoglobin 8.2 (L) 13.0 - 17.0 g/dL   HCT 26.5 (L) 39.0 - 52.0 %   MCV 95.3 80.0 - 100.0  fL   MCH 29.5 26.0 - 34.0 pg   MCHC 30.9 30.0 - 36.0 g/dL   RDW 13.5 11.5 - 15.5 %   Platelets 499 (H) 150 - 400 K/uL   nRBC 0.0 0.0 - 0.2 %   Neutrophils Relative % 95 %   Neutro Abs 15.0 (H) 1.7 - 7.7 K/uL   Lymphocytes  Relative 3 %   Lymphs Abs 0.5 (L) 0.7 - 4.0 K/uL   Monocytes Relative 1 %   Monocytes Absolute 0.2 0.1 - 1.0 K/uL   Eosinophils Relative 0 %   Eosinophils Absolute 0.0 0.0 - 0.5 K/uL   Basophils Relative 0 %   Basophils Absolute 0.0 0.0 - 0.1 K/uL   Immature Granulocytes 1 %   Abs Immature Granulocytes 0.15 (H) 0.00 - 0.07 K/uL    Comment: Performed at [facility] [address] [place], [place] [address]  Basic metabolic panel     Status: Abnormal   Collection Time: 10/05/20  2:36 AM  Result Value Ref Range   Sodium 131 (L) 135 - 145 mmol/L   Potassium 6.2 (H) 3.5 - 5.1 mmol/L   Chloride 92 (L) 98 - 111 mmol/L   CO2 31 22 - 32 mmol/L   Glucose, Bld 235 (H) 70 - 99 mg/dL    Comment: Glucose reference range applies only to samples taken after fasting for at least 8 hours.   BUN 56 (H) 8 - 23 mg/dL   Creatinine, Ser 1.34 (H) 0.61 - 1.24 mg/dL   Calcium 8.5 (L) 8.9 - 10.3 mg/dL   GFR, Estimated 55 (L) >60 mL/min    Comment: (NOTE) Calculated using the CKD-EPI Creatinine Equation (2021)    Anion gap 8 5 - 15    Comment: Performed at [facility] [address] [place], [place] [address]  Glucose, capillary     Status: Abnormal   Collection Time: 10/05/20  3:06 AM  Result Value Ref Range   Glucose-Capillary 256 (H) 70 - 99 mg/dL    Comment: Glucose reference range applies only to samples taken after fasting for at least 8 hours.  Glucose, capillary     Status: Abnormal   Collection Time: 10/05/20  7:47 AM  Result Value Ref Range   Glucose-Capillary 305 (H) 70 - 99 mg/dL    Comment: Glucose reference range applies only to samples taken after fasting for at least 8 hours.  Basic metabolic panel     Status: Abnormal   Collection Time: 10/05/20  7:55 AM  Result Value Ref Range   Sodium 130 (L) 135 - 145 mmol/L   Potassium 7.0 (HH) 3.5 - 5.1 mmol/L    Comment: NO VISIBLE HEMOLYSIS CRITICAL RESULT CALLED TO, READ BACK BY AND VERIFIED WITH: M.SURLES RN [phone] 10/05/20  [place] K    Chloride 91 (L) 98 - 111 mmol/L   CO2 32 22 - 32 mmol/L   Glucose, Bld 360 (H) 70 - 99 mg/dL    Comment: Glucose reference range applies only to samples taken after fasting for at least 8 hours.   BUN 60 (H) 8 - 23 mg/dL   Creatinine, Ser 1.44 (H) 0.61 - 1.24 mg/dL   Calcium 8.5 (L) 8.9 - 10.3 mg/dL   GFR, Estimated 50 (L) >60 mL/min    Comment: (NOTE) Calculated using the CKD-EPI Creatinine Equation (2021)    Anion gap 7 5 - 15    Comment: Performed at [facility] [place],  [place] [address]  Basic metabolic panel     Status: Abnormal   Collection Time: 10/05/20 12:00 PM  Result Value Ref Range   Sodium 133 (L) 135 - 145 mmol/L   Potassium 6.3 (HH) 3.5 - 5.1 mmol/L    Comment: NO VISIBLE HEMOLYSIS CRITICAL RESULT CALLED TO, READ BACK BY AND VERIFIED WITH: M.SURLES RN 1252 10/05/20 M[place] K    Chloride 89 (L) 98 - 111 mmol/L   CO2 36 (H) 22 - 32 mmol/L   Glucose, Bld 269 (H) 70 - 99 mg/dL    Comment: Glucose reference range applies only to samples taken after fasting for at least 8 hours.   BUN 66 (H) 8 - 23 mg/dL   Creatinine, Ser 1.49 (H) 0.61 - 1.24 mg/dL   Calcium 8.5 (L) 8.9 - 10.3 mg/dL   GFR, Estimated 48 (L) >60 mL/min    Comment: (NOTE) Calculated using the CKD-EPI Creatinine Equation (2021)    Anion gap 8 5 - 15    Comment: Performed at [facility] [address] [place], [place] [address]  Glucose, capillary     Status: Abnormal   Collection Time: 10/05/20 12:13 PM  Result Value Ref Range   Glucose-Capillary 291 (H) 70 - 99 mg/dL    Comment: Glucose reference range applies only to samples taken after fasting for at least 8 hours.  Glucose, capillary     Status: Abnormal   Collection Time: 10/05/20  4:24 PM  Result Value Ref Range   Glucose-Capillary 138 (H) 70 - 99 mg/dL    Comment: Glucose reference range applies only to samples taken after fasting for at least 8 hours.  Potassium     Status: Abnormal    Collection Time: 10/05/20  4:46 PM  Result Value Ref Range   Potassium 5.4 (H) 3.5 - 5.1 mmol/L    Comment: Performed at [facility], [place] [address] [place], [place] [address]  Glucose, capillary     Status: None   Collection Time: 10/05/20  7:56 PM  Result Value Ref Range   Glucose-Capillary 98 70 - 99 mg/dL    Comment: Glucose reference range applies only to samples taken after fasting for at least 8 hours.  Glucose, capillary     Status: Abnormal   Collection Time: 10/05/20 11:34 PM  Result Value Ref Range   Glucose-Capillary 110 (H) 70 - 99 mg/dL    Comment: Glucose reference range applies only to samples taken after fasting for at least 8 hours.  CBC with Differential/Platelet     Status: Abnormal   Collection Time: 10/06/20  1:51 AM  Result Value Ref Range   WBC 16.8 (H) 4.0 - 10.5 K/uL   RBC 2.60 (L) 4.22 - 5.81 MIL/uL   Hemoglobin 7.7 (L) 13.0 - 17.0 g/dL   HCT 25.6 (L) 39.0 - 52.0 %   MCV 98.5 80.0 - 100.0 fL   MCH 29.6 26.0 - 34.0 pg   MCHC 30.1 30.0 - 36.0 g/dL   RDW 13.4 11.5 - 15.5 %   Platelets 554 (H) 150 - 400 K/uL   nRBC 0.0 0.0 - 0.2 %   Neutrophils Relative % 82 %   Neutro Abs 13.7 (H) 1.7 - 7.7 K/uL   Lymphocytes Relative 6 %   Lymphs Abs 1.1 0.7 - 4.0 K/uL   Monocytes Relative 11 %   Monocytes Absolute 1.8 (H) 0.1 - 1.0 K/uL   Eosinophils Relative 0 %   Eosinophils Absolute 0.0 0.0 -  0.5 K/uL   Basophils Relative 0 %   Basophils Absolute 0.0 0.0 - 0.1 K/uL   Immature Granulocytes 1 %   Abs Immature Granulocytes 0.21 (H) 0.00 - 0.07 K/uL    Comment: Performed at [facility], [place] [address], [place], [place] [address]  Basic metabolic panel     Status: Abnormal   Collection Time: 10/06/20  1:51 AM  Result Value Ref Range   Sodium 134 (L) 135 - 145 mmol/L   Potassium 5.1 3.5 - 5.1 mmol/L   Chloride 89 (L) 98 - 111 mmol/L   CO2 38 (H) 22 - 32 mmol/L   Glucose, Bld 116 (H) 70 - 99 mg/dL    Comment: Glucose reference range applies only to  samples taken after fasting for at least 8 hours.   BUN 77 (H) 8 - 23 mg/dL   Creatinine, Ser 1.47 (H) 0.61 - 1.24 mg/dL   Calcium 8.9 8.9 - 10.3 mg/dL   GFR, Estimated 49 (L) >60 mL/min    Comment: (NOTE) Calculated using the CKD-EPI Creatinine Equation (2021)    Anion gap 7 5 - 15    Comment: Performed at [facility] [address], [place], [place] [address]  Glucose, capillary     Status: Abnormal   Collection Time: 10/06/20  3:49 AM  Result Value Ref Range   Glucose-Capillary 110 (H) 70 - 99 mg/dL    Comment: Glucose reference range applies only to samples taken after fasting for at least 8 hours.  Glucose, capillary     Status: Abnormal   Collection Time: 10/06/20  8:07 AM  Result Value Ref Range   Glucose-Capillary 123 (H) 70 - 99 mg/dL    Comment: Glucose reference range applies only to samples taken after fasting for at least 8 hours.  Glucose, capillary     Status: None   Collection Time: 10/06/20 11:15 AM  Result Value Ref Range   Glucose-Capillary 99 70 - 99 mg/dL    Comment: Glucose reference range applies only to samples taken after fasting for at least 8 hours.   No results found.     Medical Problem List and Plan: 1.  Thoracic vertebral fracture  -patient may not shower  -ELOS/Goals: modI 10-14 days  -Admit to CIR 2.  Antithrombotics: -DVT/anticoagulation:  Mechanical: Sequential compression devices, below knee Bilateral lower extremities  -antiplatelet therapy: N/A 3. Pain Management: Continue Oxycodone prn 4. Mood: LCSW to follow for evaluation and support.   -antipsychotic agents: N/a 5. Neuropsych: This patient is not fully capable of making decisions on his own behalf. 6. Skin/Wound Care: Routine pressure relief measures 7. Fluids/Electrolytes/Nutrition: Strict I/O with daily weights. Prosource bid.   --Nephro tube feeds with free water 100 cc tid.  8. T11 chance fracture s/p  9. VDRF/COPD/hypercarbic respiratory failure: Continue oxygen per  ATC and titrate with activity  ---frequent suctioning with mucomyst to help manage secretions.   --Robitussin DM TID as well as Pulmicort/Yupelri nebs  10. Chronic  A fib: Monitor HR tid--continue Cardizem qid  --Eliquis continues to be on hold 11. Anxiety d/o: Continue buspar bid 12. T2DM: On Lantus 12 units bid with SSI for elevated BS  --Continue to monitor BS every 4 hours and titrate insulin as indicated.   CBGs 99-123 on 4/29 13. Acute on chronic renal failure: Baseline SCr 1.4 but BUN on rise-->may need to stay dry.   -- monitor daily weights (inconsistent) 181-->196 lbs overnight and ? Accurate.  14.  Hyperkalemia: K+ 5.1 today-> will continue daily BMET.   --appreciate nephrology assistance.  15. Crohn's disease: Rectal tube to manage diarrhea  --continue lomotil qid. Will add probiotic.  15. Soft tissue neck mass: S/p biopsy 04/27 per Dr/ Fredric Dine  I have personally performed a face to face diagnostic evaluation, including, but not limited to relevant history and physical exam findings, of this patient and developed relevant assessment and plan.  Additionally, I have reviewed and concur with the physician assistant's documentation above.  Leeroy Cha, MD  Izora Ribas, MD 10/06/2020

## 2020-10-06 NOTE — Discharge Summary (Signed)
Physician Discharge Summary  Devin [name] OEH:[id number] DOB: [birth year] DOA: 09/10/2020  PCP: Prince Solian, MD  Admit date: 09/10/2020 Discharge date: 10/06/2020  Admitted From: home Discharge disposition: CIR   Code Status: Full Code  Diet Recommendation: Currently tube feeding  Discharge Diagnosis:   Active Problems:   S/P lobectomy of lung   Thoracic compression fracture ([place])   Acute and chronic respiratory failure, unspecified whether with hypoxia or hypercapnia ([place])   Left rib fracture   Hemothorax on left   Acute on chronic respiratory failure with hypoxia and hypercapnia ([facility])   Pressure injury of skin   Status post thoracic spinal fusion   Ineffective airway clearance   Acute encephalopathy   Status post tracheostomy ([place])   Atrial fibrillation ([place])   History of cancer   Diabetes mellitus type 2 in nonobese [facility])   Chronic obstructive pulmonary disease [facility])  Chief Complaint  Patient presents with  . Back Pain  . Vomiting   Brief narrative: Devin [name] is a 80 y.o. male with PMH significant for DM2, HTN, A. 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  On 3/31, patient fell while shopping at a bookstore.  No head trauma or LOC but had a new onset back pain.  4/1, seen by PCP and underwent thoracic, lumbar, and chest x-ray. Found to have new T11 Chance fracture.He also has a stable L1 compression fracture, extensive lumbar spondylosis and postsurgical changes. Additionally hada left seventh rib fracture anteriorly and a left pleural effusion without any obvious pneumothorax.  Pain control was tried as an outpatient with hydrocodone and Robaxin but it became intolerable.  Additionally, he was not able to use his incentive spirometer because of chest pain from rib fracture.   He continued to have progressive shortness of breath and hence presented to ED on 4/3 with excruciating pain, agitation, shortness of breath.  In the ED, patient required intubation and admitted to ICU. Seen by neurosurgery.  Significant Hospital events  4/8, neurosurgery did laking of the prior rod systems (T7-11 and T11-L4 rods) across T11 fracture.  4/9- failed attempt extubation. Decadron X 4 doses for airway edema   4/10 IVFs increased for mild Cr bump  4/11 Failed extubation 2/2 progressive weakness, worse cough mechanics (essentially just fatigued and progressed to respiratory failure) re-intubated. Sputum sent post re-intubation   4/12 trach at bedside.   4/13 sputum grew Klebsiella. Low gd temp/ongoing leukocytosis so Ceftriaxone started. GI consulted for diarrhea in context of known chron's   4/14-4/18 -gradually transition to trach collar  4/20 transferred out to hospitalist service  4/21-insurance denied CIR transfer  4/22 failed barium swallow evaluation  4/23-CT scan of neck soft tissue was obtained which raised suspicion of laryngeal carcinoma.  4/27, underwent laryngoscopy and biopsy by ENT Dr.Skotnicki in [place].  4/29, discharged to CIR  Subjective: Patient was seen and examined this morning. Lying on bed.  Not in distress.  Continues to have thick secretions through the tracheostomy tube.  Daughter at bedside. 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 Reviewed extubation trials in ICU. Underwent tracheostomy on 4/12 -Currently on 5 L oxygen by tracheostomy tube -Continue tracheostomy care. Continue frequent suctioning. -Because of worsening secretions, I have started him on Robitussin/DM syrup, Mucomyst, albuterol,  hypertonic saline and chest PT. -Continue the  same at rehab post discharge  Klebsiella pneumonia Small loculated left effusion -Completed treatment with IV ceftriaxone -Continue yupelri and Pulmicort  Dysphagia -Currently has tube feeding through Dobbhoff.  Speech therapy following.  -I think once full extent of his potential laryngeal carcinoma status is known, it will be clear if he needs a long-term feeding tube like a PEG tube.  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 -4/27, underwent laryngoscopy and biopsy by ENT Dr.Skotnicki in [place]. Pending biopsy report.   -Resume Eliquis at discharge.    Acute hyperkalemia -4/28, potassium level noted to be acutely elevated to 7.  Nephrology consultation was obtained.  Tube feedings adjusted.  1 dose of Kayexalate given.  Potassium low back to normal today.  As needed Lokelma to be used post discharge. Recent Labs  Lab 09/30/20 0324 10/01/20 0346 10/02/20 0748 10/03/20 0154 10/05/20 0236 10/05/20 0755 10/05/20 1200 10/05/20 1646 10/06/20 0151  K 5.1 5.2* 5.5* 5.1 6.2* 7.0* 6.3* 5.4* 5.1   Acute anemia of critical illness -Based hemoglobin more than 11. -Hemoglobin running in low range between 7-8 now.  No active bleeding. Recent Labs    10/01/20 0346 10/02/20 0748 10/03/20 0154 10/05/20 0236 10/06/20 0151  HGB 8.4* 7.7* 7.8* 8.2* 7.7*  MCV 95.3 94.3 94.0 95.3 98.5  VITAMINB12 397  --   --   --   --   FOLATE 11.6  --   --   --   --   FERRITIN 119  --   --   --   --   TIBC 349  --   --   --   --   IRON 26*  --   --   --   --   RETICCTPCT 2.5  --   --   --   --     Acute kidney injury  -Creatinine seems to be worsening in the last few days. On tube feeding. Not on regular IV fluid.  On free water 200 mg 3 times daily.  Since, patient is also starting to get pedal edema, will reduce to fluid to 100 mL 3 times daily. Recent Labs    09/26/20 0337 09/28/20 0019 09/30/20 0324  10/01/20 0346 10/02/20 0748 10/03/20 0154 10/05/20 0236 10/05/20 0755 10/05/20 1200 10/06/20 0151  BUN 50* 50* 51* 50* 48* 47* 56* 60* 66* 77*  CREATININE 1.22 1.10 1.16 1.15 1.13 1.20 1.34* 1.44* 1.49* 1.47*   Paroxysmal Afib  -Was in RVR intermittently in the hospital. -Currently on normal sinus rhythm on atenolol and diltiazem. -Eliquis remains on hold.  Essential hypertension -Blood pressure controlled on atenolol and diltiazem.  Diuretics on hold.  Type 2 diabetes mellitus -A1c 6.4 on 4/3. -Currently on Lantus 12 units twice daily.  Blood sugar level this morning seems elevated to over 300.  Continue to monitor for next 24 hours.  May need to increase Lantus dose. -Continue sliding scale insulin with Accu-Cheks. Recent Labs  Lab 10/05/20 1956 10/05/20 2334 10/06/20 0349 10/06/20 0807 10/06/20 1115  GLUCAP 98 110* 110* 123* 99   Acute diarrhea History of Crohn's disease -Per patient's wife prior to admission, patient follows up with GI Dr. Earlean Shawl as an outpatient.  He was on mesalamine 1600 mg twice daily and as needed loperamide.  Patient is currently not able to get mesalamine because it cannot be crushed.  -He is also on liquid/tube feeding probably  making diarrhea worse. Currently on Imodium scheduled 4 times daily Questran 4 g twice daily and fiber.  -We will consult with GI to follow-up at CIR to suggest if any alternative treatment is available to control diarrhea.  Continue Flexi-Seal  T11-T12 Chance fracture -Status postsurgical stabilization by neurosurgery -Continue PT/OT  History of ankylosing spondylitis, kyphoscoliosis -Patient's wife, patient was physically functional prior to this hospitalization.  Continue PT/OT  History of adenocarcinoma s/p LUL lobectomy (2016) History of thyroid and prostate cancer -Continue outpatient follow-up with pulmonology.  Hypothyroidism Continue Synthroid   Impaired mobility -PT eval obtained.  CIR recommended.     Wound care: Pressure Injury 09/12/20 Back Mid;Lower Stage 1 -  Intact skin with non-blanchable redness of a localized area usually over a bony prominence. Blister-like skin breakdown from patients TSLO brace (Active)  Date First Assessed/Time First Assessed: 09/12/20 2000   Location: Back  Location Orientation: Mid;Lower  Staging: Stage 1 -  Intact skin with non-blanchable redness of a localized area usually over a bony prominence.  Wound Description (Comments): Bl...    Assessments 09/14/2020 12:00 AM 10/06/2020  8:00 AM  Dressing Type Foam - Lift dressing to assess site every shift --  Dressing Changed --  Dressing Change Frequency PRN --  Site / Wound Assessment Red;Pink --  Peri-wound Assessment Pink --  Margins Unattached edges (unapproximated) --  Drainage Amount Scant --  Drainage Description Serosanguineous --  Treatment -- Other (Comment)     No Linked orders to display     Wound / Incision (Open or Dehisced) 09/13/20 Skin tear Right;Distal;Posterior skin tear R distal posterior forearm (Active)  Date First Assessed/Time First Assessed: 09/13/20 2200   Wound Type: Skin tear  Location Orientation: Right;Distal;Posterior  Wound Description (Comments): skin tear R distal posterior forearm  Present on Admission: (c) Yes    Assessments 09/14/2020 12:00 AM 10/05/2020  8:00 AM  Dressing Type Non adherent;Gauze (Comment) None  Dressing Changed New --  Dressing Status Clean;Dry;Intact --  Site / Wound Assessment Bleeding;Pink;Red Clean;Dry  Margins Unattached edges (unapproximated) --  Closure None --  Drainage Amount Minimal --     No Linked orders to display     Wound / Incision (Open or Dehisced) 09/13/20 Skin tear Arm Left skin tear (Active)  Date First Assessed/Time First Assessed: 09/13/20 2200   Wound Type: Skin tear  Location: Arm  Location Orientation: Left  Wound Description (Comments): skin tear  Present on Admission: (c) Yes    Assessments 09/14/2020 12:00 AM 10/05/2020   8:00 AM  Dressing Type Foam - Lift dressing to assess site every shift Foam - Lift dressing to assess site every shift  Dressing Changed New --  Dressing Status Clean;Dry;Intact Clean;Intact;Dry  Site / Wound Assessment Bleeding;Pink;Red --  Margins Unattached edges (unapproximated) --  Closure None --  Drainage Amount Scant --     No Linked orders to display     Incision (Closed) 09/15/20 Back (Active)  Date First Assessed/Time First Assessed: 09/15/20 1639   Location: Back    Assessments 09/15/2020  8:00 PM 10/05/2020  8:00 AM  Dressing Type Honeycomb Foam - Lift dressing to assess site every shift  Dressing Old drainage (marked) Clean;Dry;Intact  Dressing Change Frequency -- PRN  Site / Wound Assessment Dressing in place / Unable to assess --  Margins Attached edges (approximated) --  Closure Sutures --  Drainage Amount Scant --  Drainage Description Sanguineous --     No Linked orders to display  Incision (Closed) 10/04/20 Lip Other (Comment) (Active)  Date First Assessed/Time First Assessed: 10/04/20 1816   Location: Lip  Location Orientation: Other (Comment)    Assessments 10/06/2020  8:00 AM  Treatment Other (Comment)     No Linked orders to display    Discharge Exam:   Vitals:   10/06/20 0826 10/06/20 0827 10/06/20 0828 10/06/20 1117  BP:    (!) 129/58  Pulse:   70 72  Resp:   18 (!) 23  Temp:    98.3 F (36.8 C)  TempSrc:    Oral  SpO2: 93% 93% 93% 100%  Weight:      Height:        Body mass index is 29.06 kg/m.  General exam: Pleasant, elderly Caucasian male. Not in distress Skin: No rashes, lesions or ulcers. HEENT: Atraumatic, normocephalic, no obvious bleeding.  Has tracheostomy tube anteriorly, continues to have thick secretions. Tube feeding through Dobbhoff. Lungs: Clear to auscultation bilaterally CVS: Regular rate and rhythm, no murmur GI/Abd soft, mild generalized tenderness with Crohn's disease, bowel sound present CNS: Nods his head and  moves lips to answer questions.  Alert, awake, oriented x3 Psychiatry: Depressed look Extremities: No pedal edema, no calf tenderness  Follow ups:   Discharge Instructions    Diet general   Complete by: As directed    Tube feeding   Discharge wound care:   Complete by: As directed    Per wound care input   Increase activity slowly   Complete by: As directed       Follow-up Information    Avva, Ravisankar, MD Follow up.   Specialty: Internal Medicine Contact information: [place] [address] [phone]        Sanda Klein, MD .   Specialty: Cardiology Contact information: [address] [place] [address] [phone]               Recommendations for Outpatient Follow-Up:   1. Follow-up with PCP as an outpatient  Discharge Instructions:  Follow with Primary MD Avva, Ravisankar, MD in 7 days   Get CBC/BMP checked in next visit within 1 week by PCP or SNF MD ( we routinely change or add medications that can affect your baseline labs and fluid status, therefore we recommend that you get the mentioned basic workup next visit with your PCP, your PCP may decide not to get them or add new tests based on their clinical decision)  On your next visit with your PCP, please Get Medicines reviewed and adjusted.  Please request your PCP  to go over all Hospital Tests and Procedure/Radiological results at the follow up, please get all Hospital records sent to your Prim MD by signing hospital release before you go home.  Activity: As tolerated with Full fall precautions use walker/cane & assistance as needed  For Heart failure patients - Check your Weight same time everyday, if you gain over 2 pounds, or you develop in leg swelling, experience more shortness of breath or chest pain, call your Primary MD immediately. Follow Cardiac Low Salt Diet and 1.5 lit/day fluid restriction.  If you have smoked or chewed Tobacco in the last 2 yrs  please stop smoking, stop any regular Alcohol  and or any Recreational drug use.  If you experience worsening of your admission symptoms, develop shortness of breath, life threatening emergency, suicidal or homicidal thoughts you must seek medical attention immediately by calling 911 or calling your MD immediately  if symptoms  less severe.  You Must read complete instructions/literature along with all the possible adverse reactions/side effects for all the Medicines you take and that have been prescribed to you. Take any new Medicines after you have completely understood and accpet all the possible adverse reactions/side effects.   Do not drive, operate heavy machinery, perform activities at heights, swimming or participation in water activities or provide baby sitting services if your were admitted for syncope or siezures until you have seen by Primary MD or a Neurologist and advised to do so again.  Do not drive when taking Pain medications.  Do not take more than prescribed Pain, Sleep and Anxiety Medications  Wear Seat belts while driving.   Please note You were cared for by a hospitalist during your hospital stay. If you have any questions about your discharge medications or the care you received while you were in the hospital after you are discharged, you can call the unit and asked to speak with the hospitalist on call if the hospitalist that took care of you is not available. Once you are discharged, your primary care physician will handle any further medical issues. Please note that NO REFILLS for any discharge medications will be authorized once you are discharged, as it is imperative that you return to your primary care physician (or establish a relationship with a primary care physician if you do not have one) for your aftercare needs so that they can reassess your need for medications and monitor your lab values.    Allergies as of 10/06/2020   No Known Allergies     Medication List     STOP taking these medications   amoxicillin-clavulanate 875-125 MG tablet Commonly known as: AUGMENTIN   diltiazem 30 MG tablet Commonly known as: Cardizem Replaced by: diltiazem 10 mg/ml  oral suspension   hydrochlorothiazide 12.5 MG tablet Commonly known as: HYDRODIURIL   insulin lispro 100 UNIT/ML KiwkPen Commonly known as: HumaLOG KwikPen     TAKE these medications   acetaminophen 160 MG/5ML solution Commonly known as: TYLENOL Place 15.6 mLs (500 mg total) into feeding tube every 6 (six) hours.   acetylcysteine 20 % nebulizer solution Commonly known as: MUCOMYST Take 3 mLs (600 mg total) by nebulization 2 (two) times daily.   albuterol 108 (90 Base) MCG/ACT inhaler Commonly known as: VENTOLIN HFA Inhale 2 puffs into the lungs every 6 (six) hours as needed for wheezing or shortness of breath.   ALPRAZolam 0.5 MG tablet Commonly known as: XANAX Take 0.5 mg by mouth 2 (two) times daily as needed for anxiety.   apixaban 5 MG Tabs tablet Commonly known as: ELIQUIS Take 5 mg by mouth 2 (two) times daily.   ASCENSIA CONTOUR TEST VI Use as directed to test two times a day   glucose blood test strip Commonly known as: Estate manager/land agent TEST BLOOD SUGAR TWICE DAILY AS DIRECTED.   glucose blood test strip 1 each by Other route 2 times daily.   atenolol 25 MG tablet Commonly known as: TENORMIN Take 1 tablet (25 mg total) by mouth 2 (two) times daily.   B-D ULTRAFINE III SHORT PEN 31G X 8 MM Misc Generic drug: Insulin Pen Needle USE 1 PEN NEEDLE FOUR TIMES DAILY. What changed: Another medication with the same name was removed. Continue taking this medication, and follow the directions you see here.   bethanechol 5 MG tablet Commonly known as: URECHOLINE 1 tablet (5 mg total) by Per NG tube route 4 (four) times daily.  budesonide 0.5 MG/2ML nebulizer solution Commonly known as: PULMICORT Take 2 mLs (0.5 mg total) by nebulization 2 (two) times daily.    busPIRone 15 MG tablet Commonly known as: BUSPAR Take 15 mg by mouth 2 (two) times daily.   calcium-vitamin D 500-200 MG-UNIT tablet Commonly known as: OSCAL WITH D Take 2 tablets by mouth daily.   cholestyramine 4 g packet Commonly known as: QUESTRAN Place 1 packet (4 g total) into feeding tube 2 (two) times daily before lunch and supper.   Daily-Vite/Iron/Beta-Carotene Tabs Take 1 tablet by mouth daily.   diltiazem 10 mg/ml  oral suspension Commonly known as: CARDIZEM Place 3 mLs (30 mg total) into feeding tube every 6 (six) hours. Replaces: diltiazem 30 MG tablet   diphenhydrAMINE 25 mg capsule Commonly known as: BENADRYL 1 capsule (25 mg total) by Per NG tube route at bedtime as needed for sleep.   feeding supplement (NEPRO CARB STEADY) Liqd Place 1,000 mLs into feeding tube continuous.   nutrition supplement (JUVEN) Pack Place 1 packet into feeding tube 2 (two) times daily between meals.   feeding supplement (PROSource TF) liquid Place 45 mLs into feeding tube daily. Start taking on: October 07, 2020   ferric gluconate 250 mg in sodium chloride 0.9 % 100 mL Inject 250 mg into the vein daily.   fiber Pack packet Place 1 packet into feeding tube 2 (two) times daily.   free water Soln Place 100 mLs into feeding tube every 8 (eight) hours.   Gerhardt's butt cream Crea Apply 1 application topically 2 (two) times daily.   guaiFENesin-dextromethorphan 100-10 MG/5ML syrup Commonly known as: ROBITUSSIN DM Place 5 mLs into feeding tube every 8 (eight) hours.   HYDROcodone-acetaminophen 5-325 MG tablet Commonly known as: NORCO/VICODIN Take 1 tablet by mouth every 6 (six) hours as needed (pain).   insulin aspart 100 UNIT/ML injection Commonly known as: novoLOG Inject 0-20 Units into the skin every 4 (four) hours.   insulin glargine 100 UNIT/ML injection Commonly known as: LANTUS Inject 0.12 mLs (12 Units total) into the skin 2 (two) times daily.   leuprolide  11.25 MG injection Commonly known as: LUPRON Inject 11.25 mg into the muscle every 6 (six) months.   levothyroxine 125 MCG tablet Commonly known as: SYNTHROID Take 1 tablet (125 mcg total) by mouth daily before breakfast.   loperamide HCl 1 MG/7.5ML suspension Commonly known as: IMODIUM Place 15 mLs (2 mg total) into feeding tube 2 (two) times daily as needed for diarrhea or loose stools.   loperamide HCl 1 MG/7.5ML suspension Commonly known as: IMODIUM Place 30 mLs (4 mg total) into feeding tube 4 (four) times daily.   Mesalamine 800 MG Tbec Take 1,600 mg by mouth 2 (two) times daily.   methocarbamol 500 MG tablet Commonly known as: ROBAXIN Take 500 mg by mouth every 8 (eight) hours.   ondansetron 4 MG/2ML Soln injection Commonly known as: ZOFRAN Inject 2 mLs (4 mg total) into the vein every 6 (six) hours as needed for nausea or vomiting.   oxyCODONE 5 MG/5ML solution Commonly known as: ROXICODONE Place 5 mLs (5 mg total) into feeding tube every 4 (four) hours as needed for moderate pain.   phenol 1.4 % Liqd Commonly known as: CHLORASEPTIC Use as directed 1 spray in the mouth or throat as needed for throat irritation / pain.   revefenacin 175 MCG/3ML nebulizer solution Commonly known as: YUPELRI Take 3 mLs (175 mcg total) by nebulization daily. Start taking on: October 07, 2020  Spiriva Respimat 2.5 MCG/ACT Aers Generic drug: Tiotropium Bromide Monohydrate Inhale 2 puffs into the lungs daily.   zinc gluconate 50 MG tablet Take 50 mg by mouth daily.            Discharge Care Instructions  (From admission, onward)         Start     Ordered   10/06/20 0000  Discharge wound care:       Comments: Per wound care input   10/06/20 1135          Time coordinating discharge: 35 minutes  The results of significant diagnostics from this hospitalization (including imaging, microbiology, ancillary and laboratory) are listed below for reference.    Procedures  and Diagnostic Studies:   DG Abdomen 1 View  Result Date: 09/10/2020 CLINICAL DATA:  Orogastric tube placement. EXAM: ABDOMEN - 1 VIEW COMPARISON:  Radiograph 12/15/2019. FINDINGS: 1304 hours. Tip of the endotracheal tube overlies the left upper quadrant of the abdomen, consistent with position in the proximal stomach. The visualized bowel gas pattern is nonobstructive. There are postsurgical changes related to ventral hernia repair and thoracolumbar fusion. The spinal rods are chronically fractured at the T11-12 level bilaterally. IMPRESSION: Enteric tube tip in the left upper quadrant the abdomen consistent with position in the proximal stomach. Electronically Signed   By: Richardean Sale M.D.   On: 09/10/2020 13:52   CT Head Wo Contrast  Result Date: 09/10/2020 CLINICAL DATA:  Status post fall. EXAM: CT HEAD WITHOUT CONTRAST TECHNIQUE: Contiguous axial images were obtained from the base of the skull through the vertex without intravenous contrast. COMPARISON:  March 13, 2015 FINDINGS: Brain: There is mild cerebral atrophy with widening of the extra-axial spaces and ventricular dilatation. There are areas of decreased attenuation within the white matter tracts of the supratentorial brain, consistent with microvascular disease changes. Vascular: No hyperdense vessel or unexpected calcification. Skull: Normal. Negative for fracture or focal lesion. Sinuses/Orbits: A 1.3 cm x 1.1 cm right maxillary sinus polyp versus mucous retention cyst is seen. Other: None. IMPRESSION: 1. Mild cerebral atrophy and microvascular disease changes of the supratentorial brain. 2. No acute intracranial abnormality. 3. Right maxillary sinus polyp versus mucous retention cyst. Electronically Signed   By: Virgina Norfolk M.D.   On: 09/10/2020 15:38   CT Angio Chest PE W and/or Wo Contrast  Result Date: 09/10/2020 CLINICAL DATA:  Worsening hypoxia. Lung cancer. Question pulmonary embolism EXAM: CT ANGIOGRAPHY CHEST WITH CONTRAST  TECHNIQUE: Multidetector CT imaging of the chest was performed using the standard protocol during bolus administration of intravenous contrast. Multiplanar CT image reconstructions and MIPs were obtained to evaluate the vascular anatomy. CONTRAST:  152m OMNIPAQUE IOHEXOL 350 MG/ML SOLN COMPARISON:  CT 01/04/2020 FINDINGS: Cardiovascular: The pulmonary arteries are well opacified with contrast to the level of the subsegmental branches. There is no evidence of acute pulmonary embolism. No significant systemic arterial abnormalities are identified, although enhancement of the aortic lumen is limited. The heart size is normal. There is no pericardial effusion. Mediastinum/Nodes: There are no enlarged mediastinal, hilar or axillary lymph nodes.Endotracheal tube terminates in the distal trachea. An enteric tube projects into the mid stomach. The thyroid gland, trachea and esophagus demonstrate no significant findings. Lungs/Pleura: There are small left greater than right pleural effusions with interval enlargement of the right pleural effusion. No pneumothorax. Previous left upper lobectomy. 5 mm nodule in the superior segment of the left lower lobe is unchanged (image 36/5). No new or enlarging nodules. There is increased  compressive atelectasis in the right lower lobe. Upper abdomen:  Abdominal findings are dictated separately Musculoskeletal/Chest wall: Status post thoracolumbar rod fusion. Both spinal rods are chronically fractured at the T11-12 level. There is a new horizontal fracture plane through the inferior aspect of the T11 vertebral body, best seen on the sagittal images. The posterior extension of this fracture through the posterior elements is not well seen, although this is likely an unstable, Chance-like fracture in this patient with a fused spine. Review of the MIP images confirms the above findings. IMPRESSION: 1. No evidence of acute pulmonary embolism or other acute vascular findings. 2. Interval  enlarged right pleural effusion with increased right lower lobe atelectasis. Stable left pleural effusion. 3. Stable small left lower lobe pulmonary nodule. No evidence of local recurrence or metastatic disease. 4. New horizontal fracture through the inferior aspect of the T11 vertebral body suspicious for an unstable, Chance-like fracture in this patient who has been previously fused. The spinal rods are chronically fractured posteriorly at the T11-12 level. 5. Critical Value/emergent results were called by telephone at the time of interpretation on 09/10/2020 at 3:54 pm to Dr Tamera Punt, who verbally acknowledged these results. Electronically Signed   By: Richardean Sale M.D.   On: 09/10/2020 15:54   CT Abdomen Pelvis W Contrast  Result Date: 09/10/2020 CLINICAL DATA:  Caliber breathing. Fall with compression fracture. Stage I lung cancer post lobectomy. EXAM: CT ABDOMEN AND PELVIS WITH CONTRAST TECHNIQUE: Multidetector CT imaging of the abdomen and pelvis was performed using the standard protocol following bolus administration of intravenous contrast. Patient was very agitated and required sedation for scan. CONTRAST:  129m OMNIPAQUE IOHEXOL 350 MG/ML SOLN COMPARISON:  CT chest 01/04/2020 FINDINGS: Lower chest: Bilateral moderate layering pleural effusions. RIGHT basilar atelectasis. Hepatobiliary: No focal hepatic lesion. Postcholecystectomy. No biliary dilatation. Pancreas: Pancreas is normal. No ductal dilatation. No pancreatic inflammation. Spleen: Normal spleen Adrenals/urinary tract: Adrenal glands normal. No renal obstruction. Bladder distended Stomach/Bowel: NG tube in stomach. Duodenum small-bowel normal. No colon inflammation or obstruction. Vascular/Lymphatic: Abdominal aorta is normal caliber. No periportal or retroperitoneal adenopathy. No pelvic adenopathy. Reproductive: Prostate unremarkable Other: No intraperitoneal free air free fluid. Musculoskeletal: Posterior lumbar fusion IMPRESSION: 1. No acute  findings abdomen pelvis. 2. No renal obstruction. 3. No evidence of bowel inflammation. 4. Bilateral pleural effusions. 5. Posterior thoracolumbar fusion. Electronically Signed   By: SSuzy BouchardM.D.   On: 09/10/2020 15:38   DG Chest Port 1 View  Result Date: 09/11/2020 CLINICAL DATA:  Acute respiratory failure EXAM: PORTABLE CHEST 1 VIEW COMPARISON:  Prior chest x-ray yesterday FINDINGS: The patient is intubated. The tip of the endotracheal tube is 2 cm above the carina. A gastric tube is present. The tip of the tube lies off the field of view but below the diaphragm and presumably within the stomach. Cardiac and mediastinal contours are within normal limits. Evaluation of the mediastinum is somewhat limited by extensive thoracic spine hardware. Chronic elevation of the right hemidiaphragm with associated chronic atelectasis versus scarring in the right lower lobe. A small layering pleural effusion is present on the left. The upper lung fields are essentially clear. No pulmonary edema. No acute osseous abnormality. IMPRESSION: 1. Overall, inspiratory volumes are slightly lower compared to yesterday. Otherwise, no significant interval change. 2. The tip of the endotracheal tube is 2 cm above the carina. 3. Chronic elevation of the right hemidiaphragm with associated right lower lobe atelectasis/scarring. 4. Small layering left pleural effusion with associated atelectasis. Electronically Signed  By: Jacqulynn Cadet M.D.   On: 09/11/2020 07:44   DG Chest Portable 1 View  Result Date: 09/10/2020 CLINICAL DATA:  Intubation and orogastric tube placement. EXAM: PORTABLE CHEST 1 VIEW COMPARISON:  Radiographs 09/10/2020 and 07/03/2020.  CT 01/04/2020. FINDINGS: 1303 hours. Interval intubation with tip of the endotracheal tube approximately 2.7 cm above the carina. Enteric tube projects below the diaphragm tip overlying the proximal stomach. The heart size and mediastinal contours are stable. There is slightly  improved aeration of the lung bases with persistent bibasilar airspace opacities and probable small pleural effusions. No pneumothorax. IMPRESSION: Satisfactory position of the endotracheal and enteric tubes. Slightly improved aeration of the lungs. Electronically Signed   By: Richardean Sale M.D.   On: 09/10/2020 13:51   DG Chest Port 1 View  Result Date: 09/10/2020 CLINICAL DATA:  Recent fall with compression fractures. Altered mental status. EXAM: PORTABLE CHEST 1 VIEW COMPARISON:  Radiographs 07/03/2020 and 03/26/2020.  CT 01/04/2020. FINDINGS: 1220 hours. Two views obtained. Persistent low lung volumes. Interval extubation. The heart size and mediastinal contours are stable. There are lower lung volumes with increased patchy airspace opacities at both lung bases. There may be small bilateral pleural effusions. No evidence of pneumothorax. Patient is status post thoracolumbar fusion. Both spinal rods are chronically fractured at the T11-12 level. IMPRESSION: Increased airspace opacities at both lung bases suspicious for pneumonia, possibly on the basis of aspiration. Chronic fracture of the thoracolumbar spinal rods. Electronically Signed   By: Richardean Sale M.D.   On: 09/10/2020 12:52     Labs:   Basic Metabolic Panel: Recent Labs  Lab 10/03/20 0154 10/03/20 1511 10/05/20 0236 10/05/20 0755 10/05/20 1200 10/05/20 1646 10/06/20 0151  NA 129*  --  131* 130* 133*  --  134*  K 5.1  --  6.2* 7.0* 6.3* 5.4* 5.1  CL 89*  --  92* 91* 89*  --  89*  CO2 32  --  31 32 36*  --  38*  GLUCOSE 183*  --  235* 360* 269*  --  116*  BUN 47*  --  56* 60* 66*  --  77*  CREATININE 1.20  --  1.34* 1.44* 1.49*  --  1.47*  CALCIUM 8.6*  --  8.5* 8.5* 8.5*  --  8.9  MG  --  2.1  --   --   --   --   --   PHOS  --  3.7  --   --   --   --   --    GFR Estimated Creatinine Clearance: 46.5 mL/min (A) (by C-G formula based on SCr of 1.47 mg/dL (H)). Liver Function Tests: No results for input(s): AST, ALT,  ALKPHOS, BILITOT, PROT, ALBUMIN in the last 168 hours. No results for input(s): LIPASE, AMYLASE in the last 168 hours. No results for input(s): AMMONIA in the last 168 hours. Coagulation profile No results for input(s): INR, PROTIME in the last 168 hours.  CBC: Recent Labs  Lab 10/01/20 0346 10/02/20 0748 10/03/20 0154 10/05/20 0236 10/06/20 0151  WBC 12.3* 12.3* 13.0* 15.8* 16.8*  NEUTROABS 9.9* 10.0* 10.8* 15.0* 13.7*  HGB 8.4* 7.7* 7.8* 8.2* 7.7*  HCT 26.6* 24.9* 24.9* 26.5* 25.6*  MCV 95.3 94.3 94.0 95.3 98.5  PLT 320 378 400 499* 554*   Cardiac Enzymes: No results for input(s): CKTOTAL, CKMB, CKMBINDEX, TROPONINI in the last 168 hours. BNP: Invalid input(s): POCBNP CBG: Recent Labs  Lab 10/05/20 1956 10/05/20 2334 10/06/20 0349 10/06/20  4799 10/06/20 1115  GLUCAP 98 110* 110* 123* 99   D-Dimer No results for input(s): DDIMER in the last 72 hours. Hgb A1c No results for input(s): HGBA1C in the last 72 hours. Lipid Profile No results for input(s): CHOL, HDL, LDLCALC, TRIG, CHOLHDL, LDLDIRECT in the last 72 hours. Thyroid function studies No results for input(s): TSH, T4TOTAL, T3FREE, THYROIDAB in the last 72 hours.  Invalid input(s): FREET3 Anemia work up No results for input(s): VITAMINB12, FOLATE, FERRITIN, TIBC, IRON, RETICCTPCT in the last 72 hours. Microbiology No results found for this or any previous visit (from the past 240 hour(s)).   Signed: Marlowe Aschoff Buck Mcaffee  [facility] 10/06/2020, 11:35 AM

## 2020-10-06 NOTE — Progress Notes (Signed)
Patient admitted to Rehab accompanied by nurse during transfer. A+Ox4. PERRLA. Bowel sounds normoactive x4. Mild expiratory wheezing noted upon assessment. Tenderness noted to abdomen with no complains of pain or discomfort. Patient has #6 trach [place] at 8 LPM 40% FiO2 with O2 sats between 95-98% at rest. Incontinent B/B; flexiseal in place+ irrigated/flushed. Q4 I/O cath for urinary retention. Patient's output after intermittent cath was 1700 cc on admission. Skin issues - refer to flowsheets (integumentary). PIV to L arm + R wrist, flushing well, CDI. Edema noted to BLE +2, pitting. No complaints of pain or discomfort, wife at bedside. Cortrak at 70 cm to left nare; Nepro running at 50/100 q8 water flush. Patient currently NPO. Call bell in reach. Will continue to monitor.

## 2020-10-06 NOTE — Evaluation (Signed)
Speech Language Pathology Assessment and Plan  Patient Details  Name: Devin [name] MRN: [id number] Date of Birth: 26-Oct-1940  SLP Diagnosis: Dysphagia;Voice disorder  Rehab Potential: Fair ELOS: 3 weeks    Today's Date: 10/06/2020 SLP Individual Time: 1410-1455 SLP Individual Time Calculation (min): 45 min   Hospital Problem: Principal Problem:   Thoracic vertebral fracture [facility])  Past Medical History:  Past Medical History:  Diagnosis Date  . A-fib ([place])   . Adenocarcinoma of left lung, stage 1 ([place]) 03/10/2015  . Ankylosing spondylitis ([place])    Diagnosed during lumbar fracture summer of 2016    . Crohn's disease ([place])   . Gout   . HIstory of basal cell cancer of face    THYROID CA HX  . History of kidney stones   . Hypertension   . Hypothyroidism   . Impotence   . 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 Obesity (BMI 30-39.9)   . Osteoporosis    Pt completed 5 years of fosamax in 2013     . Prostate cancer with recurrence    Treated with prostatectomy with recurrence 2012 with Lupron treatment now him   . Psoriasis   . Thyroid cancer ([place]) 1999   Treated with RAI and total thyroidectomy   . Type II diabetes mellitus ([place])    Past Surgical History:  Past Surgical History:  Procedure Laterality Date  . ABDOMINAL EXPLORATION SURGERY     for small bowel obstruction  . APPENDECTOMY  03/2007  . BACK SURGERY    . BASAL CELL CARCINOMA EXCISION  "several"   "head"  . CARDIAC CATHETERIZATION  03/17/2003  . CHOLECYSTECTOMY N/A 05/07/2016   Procedure: LAPAROSCOPIC CHOLECYSTECTOMY;  Surgeon: Fanny Skates, MD;  Location: [place];  Service: General;  Laterality: N/A;  . COLON SURGERY  03/2007   Resection of cecum, appendix, terminal ileum (approximately/notes 10/10/2010  . CYSTOSCOPY/URETEROSCOPY/HOLMIUM LASER/STENT PLACEMENT Right 07/03/2020   Procedure: CYSTOSCOPY/RETROGRADE/URETEROSCOPY REMOVAL OF BLADDER STONE;  Surgeon: Raynelle Bring, MD;   Location: [facility];  Service: Urology;  Laterality: Right;  . DIRECT LARYNGOSCOPY N/A 10/04/2020   Procedure: DIRECT LARYNGOSCOPY, PHARYNGOSCOPY WITH BIOPSY;  Surgeon: [name], DO;  Location: [place];  Service: ENT;  Laterality: N/A;  . ESOPHAGOSCOPY N/A 10/04/2020   Procedure: ESOPHAGOSCOPY WITH BIOPSY;  Surgeon: [name], DO;  Location: [place];  Service: ENT;  Laterality: N/A;  . HERNIA REPAIR    . LAMINECTOMY WITH POSTERIOR LATERAL ARTHRODESIS LEVEL 1 N/A 09/15/2020   Procedure: REVISION OF THORACOLUMBAR FUSION, ADDITION OF CROSS-CONNECTORS;  Surgeon: Eustace Moore, MD;  Location: [place];  Service: Neurosurgery;  Laterality: N/A;  . LAPAROSCOPIC CHOLECYSTECTOMY  05/07/2016  . LAPAROSCOPIC LYSIS OF ADHESIONS  05/07/2016  . LAPAROSCOPIC LYSIS OF ADHESIONS N/A 05/07/2016   Procedure: LAPAROSCOPIC LYSIS OF ADHESIONS TIMES ONE HOUR;  Surgeon: Fanny Skates, MD;  Location: [place];  Service: General;  Laterality: N/A;  . POSTERIOR FUSION THORACIC SPINE  02/08/2016   1. Posterior thoracic arthrodesis T7-T11 utilizing morcellized allograft, 2. Posterior thoracic segmental fixation T7-T11 utilizing nuvasive pedicle screws  . PROSTATECTOMY  06/2001   w/bilateral pelvic lymph nose dissection/notes 10/24/2010  . SPINAL FUSION  12/2014   Open reduction internal fixation of L1 Chance fracture with posterior fusion T10-L4 utilizing morcellized allograft and some local autograft, segmental instrumentation T10-L4 inclusive utilizing nuvasive pedicle screws/notes 12/16/2014  . Stress Cardiolite  02/17/2003  . THOROCOTOMY WITH LOBECTOMY  03/16/2015   Procedure: THOROCOTOMY WITH LOBECTOMY;  Surgeon:  Ivin Poot, MD;  Location: [place];  Service: Thoracic;;  . TONSILLECTOMY    . TOTAL THYROIDECTOMY  1997  . Venous Doppler  05/30/2004  . VENTRAL HERNIA REPAIR  04/14/2008  . VIDEO ASSISTED THORACOSCOPY Left 03/16/2015   Procedure: VIDEO ASSISTED THORACOSCOPY;  Surgeon: Ivin Poot, MD;  Location:  [facility] OR;  Service: Thoracic;  Laterality: Left;    Assessment / Plan / Recommendation Clinical Impression Patient is a 80 year old male with history of T2DM, A fib (on eliquis), COPD/restirctive lund disease, lung cancer, prostate cancer, thyroid cancer, anklylosing spondylitis with chronic LBP who sustained a fall on 09/07/20 wit progressive pain. He was admitted on 09/10/20 with uncontrolled pain, shortness of breath and agitation.  He became unresponsive in ED requiring intubation for acute on chronic hypercarbic respiratory failure.  CT chest revealed acute T11 vertebral fracture and TLSO was recommended by Dr. Ronnald Ramp.  Once cleared by PCCM, he underwent posterior thoracic fusion T11-T12 on 04/08.  He was unable to tolerate extubation despite addition of Decadron to help manage edema and ultimately required tracheostomy on 09/19/2020.   Hospital course further complicated by A. fib with RVR, copious secretions via trach, Kleb pneumonia--> treated with ceftriaxone, fluid overload, ongoing diarrhea requiring rectal tube.  He was started on weaning trials and extubated to ATC by 04/18.  MBS done on 04/22 revealing prevertebral tissue AC 4-5.  CT of head showed soft tissue mass at level of supraglottic larynx measuring 2.6 x 1.0 x 2.8 cm which was concerning for malignant neoplasm. Dr. Eligha Bridegroom consulted and patient underwent laryngoscopy with biopsy on 04/27 -->path pending. Respiratory status improving but he continues to have DOE/SOB with activity requiring with saturations down to 70s and requires 10 L oxygen to recover.  He continues to have difficulty mobilizing secretions and chest PT ongoing. Therapy ongoing and patient continues to be limited by weakness, SOB, balance deficits, cognitive deficits and required increased time to follow one step commands. CIR recommended due to functional decline. Patient admitted 10/06/20.  Patient currently has a #6 cuffed trach. Upon arrival, patient's cuff was  checked for complete deflation and trach hub noted to be covered in thick, dried secretions. PMSV was donned and remained in place for ~45 minutes with all vitals remaining WFL. Patient's oxygen was decreased to 8 L to minimize environmental noise and O2 saturations remained above 90%. Patient's vocal quality was weak with a low vocal intensity which impacted his overall intelligibility. Patient required cueing for increased breath support and use of verbal expression vs gestures for communication. Suspect function was also impacted by fatigue. Recommend patient wear PMSV intermittently with staff with full supervision. Patient consumed trials of ice chips (2) after oral care via the suction toothbrush. Patient demonstrated active mastication and utilized effortful swallows. Patient with throat clearing and coughing after each trial and able to orally expectorate suspected residuals per results from recent MBS. Recommend patient remain NPO and allow ice chips intermittently for oral comfort with full supervision from staff and PMSV in place. A formal cognitive evaluation was not administered but patient noted to demonstrate deficits in attention, initiation, and awareness. Patient would benefit from skilled SLP intervention to maximize his functional communication and recall and adherence to swallowing precautions with trials of ice chips.     Skilled Therapeutic Interventions          Administered a PMSV evaluation and BSE, please see above for details.   SLP Assessment  Patient will need skilled Speech  [place] [facility] during CIR admission    Recommendations  Patient may use Passy-Muir Speech Valve: During all therapies with supervision;Intermittently with supervision PMSV Supervision: Full MD: Please consider changing trach tube to : Cuffless SLP Diet Recommendations: NPO;Alternative means - temporary Medication Administration: Via alternative means Oral Care Recommendations: Oral care  QID Patient destination: Home Follow up Recommendations: 24 hour supervision/assistance;Home Health SLP Equipment Recommended: To be determined    SLP Frequency 3 to 5 out of 7 days   SLP Duration  SLP Intensity  SLP Treatment/Interventions 3 weeks  Minumum of 1-2 x/day, 30 to 90 minutes  Dysphagia/aspiration precaution training;Speech/Language facilitation;Cueing hierarchy;Environmental controls;Therapeutic Activities;Patient/family education;Functional tasks    Pain Pain Assessment Pain Scale: 0-10 Pain Score: 0-No pain Faces Pain Scale: No hurt  Oral Motor Oral Motor/Sensory Function Overall Oral Motor/Sensory Function: Within functional limits Motor Speech Overall Motor Speech: Impaired Respiration: Impaired Level of Impairment: Word Phonation: Low vocal intensity;Breathy Articulation: Within functional limitis Intelligibility: Intelligibility reduced Word: 75-100% accurate Phrase: 75-100% accurate Sentence: 75-100% accurate Conversation: Not tested Motor Planning: Witnin functional limits Effective Techniques: Increased vocal intensity  Care Tool Care Tool Cognition Expression of Ideas and Wants Expression of Ideas and Wants: Frequent difficulty - frequently exhibits difficulty with expressing needs and ideas   Understanding Verbal and Non-Verbal Content Understanding Verbal and Non-Verbal Content: Sometimes understands - understands only basic conversations or simple, direct phrases. Frequently requires cues to understand   Memory/Recall Ability *first 3 days only Memory/Recall Ability *first 3 days only: None of the above were recalled     PMSV Assessment  PMSV Trial PMSV was placed for: 45 minutes Able to redirect subglottic air through upper airway: Yes Able to Attain Phonation: Yes Voice Quality: Low vocal intensity;Breathy Able to Expectorate Secretions: Yes Level of Secretion Expectoration with PMSV: Oral Breath Support for Phonation: Mildly  decreased Intelligibility: Intelligibility reduced Word: 75-100% accurate Phrase: 75-100% accurate Sentence: 75-100% accurate Conversation: Not tested SpO2 During Trial: 92 % Pulse During Trial: 69 Behavior: Cooperative;Lethargic;Controlled  Bedside Swallowing Assessment General Date of Onset: 09/07/20 Previous Swallow Assessment: MBS on 4/22: Recommended NPO Diet Prior to this Study: NPO;NG Tube Temperature Spikes Noted: No Respiratory Status: Trach Trach Size and Type: #6;Cuff;Deflated;With PMSV in place History of Recent Intubation: Yes Length of Intubations (days): 9 days Date extubated: 09/19/20 Behavior/Cognition: Requires cueing;Lethargic/Drowsy Oral Cavity - Dentition: Missing dentition Self-Feeding Abilities: Able to feed self Patient Positioning: Partially reclined Baseline Vocal Quality: Low vocal intensity;Breathy Volitional Cough: Strong Volitional Swallow: Able to elicit  Ice Chips Ice chips: Impaired Presentation: Spoon Pharyngeal Phase Impairments: Cough - Delayed;Throat Clearing - Immediate;Multiple swallows Thin Liquid Thin Liquid: Not tested Nectar Thick Nectar Thick Liquid: Not tested Honey Thick Honey Thick Liquid: Not tested Puree Puree: Not tested Solid Solid: Not tested BSE Assessment Risk for Aspiration Impact on safety and function: Severe aspiration risk Other Related Risk Factors: Tracheostomy;Deconditioning;Lethargy;Cognitive impairment;Prolonged intubation;Decreased respiratory status  Short Term Goals: Week 1: SLP Short Term Goal 1 (Week 1): Patient will tolerate PMSV for 60 minutes with all vitals remaining [facility] with supervision verbal cues. SLP Short Term Goal 2 (Week 1): Patient will utilize an increased vocal intensity at the phrase level to achieve ~50% intelligibility with Mod A verbal cues. SLP Short Term Goal 3 (Week 1): Patient will recall swallowing and safety precautions for trials of ice chips wth Mod A multimodal  cues.  Refer to Care Plan for Long Term Goals  Recommendations for other services: None   Discharge Criteria: Patient will  be discharged from SLP if patient refuses treatment 3 consecutive times without medical reason, if treatment goals not met, if there is a change in medical status, if patient makes no progress towards goals or if patient is discharged from hospital.  The above assessment, treatment plan, treatment alternatives and goals were discussed and mutually agreed upon: by patient and by family  Delando Satter 10/06/2020, 3:42 PM

## 2020-10-06 NOTE — Progress Notes (Signed)
Patient refuse CPT vest at this time. Stated that he wants to rest. Nebs given and trach care done. No complications noted

## 2020-10-06 NOTE — H&P (Shared)
Physical Medicine and Rehabilitation Admission H&P    Chief Complaint  Patient presents with  . Functional deficits due to T11 chance Fx with respiratory failure/diarrhe    HPI: Devin [name] is a 80 year old male with history of T2DM, A fib (on eliquis), COPD/restirctive lund disease, lung cancer, prostate cancer, thyroid cancer, anklylosing spondylitis with chronic LBP who sustained a fall on 09/07/20 wit progressive pain. He was admitted on 09/10/20 with uncontrolled pain, shortness of breath and agitation.  He became unresponsive in ED requiring intubation for acute on chronic hypercarbic respiratory failure.  CT chest revealed acute T11 vertebral fracture and TLSO was recommended by Dr. Ronnald Ramp.  Once cleared by PCCM, he underwent posterior thoracic fusion T11-T12 on 04/08.  He was unable to tolerate extubation despite addition of Decadron to help manage edema and ultimately required tracheostomy on 09/19/2020.    Dr. Hilarie Fredrickson consulted 04/13  due to issues with diarrhea and GI panel negative. Pentasa clogged his feeding tube therefore was discontinued with recommendations to resume when taking p.o.'s.  Probiotic as well as lomotil recommended to manage symptoms.  Hospital course further complicated by A. fib with RVR, copious secretions via trach, Kleb pneumonia--> treated with ceftriaxone, fluid overload, ongoing diarrhea requiring rectal tube.  He was started on weaning trials and extubated to ATC by 04/18.  MBS done on 04/22 revealing prevertebral tissue AC 4-5.  CT of head showed soft tissue mass at level of supraglottic larynx measuring 2.6 x 1.0 x 2.8 cm which was concerning for malignant neoplasm. Dr. Eligha Bridegroom consulted and patient underwent laryngoscopy with biopsy on 04/27 -->path pending.   Dr. Royce Macadamia consulted on 04/28 for hyperkalemia with peaked T waves. This improved with Kayexalate, lokelma, IV lasix, insulin and sodium bicarb. Tube feed was changed to nephro and he  received extra water flushes for volume loss from diarrhea. He continues to have copious secretions via trach and mucomyst added.  Rectal tube remains in place due to ongoing diarrhea.  Respiratory status improving but he continues to have DOE/SOB with activity requiring with saturations down to 70s and requires 10 L oxygen to recover.  He continues to have difficulty mobilizing secretions and chest PT ongoing-->water flushes decreased by nephrology today. Therapy ongoing and patient continues to be limited by weakness, SOB, balance deficits, cognitive deficits and required increased time to follow one step commands. CIR recommended due to functional decline.    Review of Systems  Unable to perform ROS: Other  Musculoskeletal: Positive for back pain.  Neurological: Positive for weakness.      Past Medical History:  Diagnosis Date  . A-fib ([place])   . Adenocarcinoma of left lung, stage 1 ([place]) 03/10/2015  . Ankylosing spondylitis ([place])    Diagnosed during lumbar fracture summer of 2016    . Crohn's disease ([place])   . Gout   . HIstory of basal cell cancer of face    THYROID CA HX  . History of kidney stones   . Hypertension   . Hypothyroidism   . Impotence   . 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 Obesity (BMI 30-39.9)   . Osteoporosis    Pt completed 5 years of fosamax in 2013     . Prostate cancer with recurrence    Treated with prostatectomy with recurrence 2012 with Lupron treatment now him   . Psoriasis   . Thyroid cancer ([place]) 1999   Treated with RAI and total thyroidectomy   .  Type II diabetes mellitus ([place])     Past Surgical History:  Procedure Laterality Date  . ABDOMINAL EXPLORATION SURGERY     for small bowel obstruction  . APPENDECTOMY  03/2007  . BACK SURGERY    . BASAL CELL CARCINOMA EXCISION  "several"   "head"  . CARDIAC CATHETERIZATION  03/17/2003  . CHOLECYSTECTOMY N/A 05/07/2016   Procedure: LAPAROSCOPIC CHOLECYSTECTOMY;  Surgeon:  Fanny Skates, MD;  Location: [facility];  Service: General;  Laterality: N/A;  . COLON SURGERY  03/2007   Resection of cecum, appendix, terminal ileum (approximately/notes 10/10/2010  . CYSTOSCOPY/URETEROSCOPY/HOLMIUM LASER/STENT PLACEMENT Right 07/03/2020   Procedure: CYSTOSCOPY/RETROGRADE/URETEROSCOPY REMOVAL OF BLADDER STONE;  Surgeon: Raynelle Bring, MD;  Location: [facility];  Service: Urology;  Laterality: Right;  . DIRECT LARYNGOSCOPY N/A 10/04/2020   Procedure: DIRECT LARYNGOSCOPY, PHARYNGOSCOPY WITH BIOPSY;  Surgeon: [name], DO;  Location: [facility];  Service: ENT;  Laterality: N/A;  . ESOPHAGOSCOPY N/A 10/04/2020   Procedure: ESOPHAGOSCOPY WITH BIOPSY;  Surgeon: [name], DO;  Location: [facility];  Service: ENT;  Laterality: N/A;  . HERNIA REPAIR    . LAMINECTOMY WITH POSTERIOR LATERAL ARTHRODESIS LEVEL 1 N/A 09/15/2020   Procedure: REVISION OF THORACOLUMBAR FUSION, ADDITION OF CROSS-CONNECTORS;  Surgeon: Eustace Moore, MD;  Location: [facility];  Service: Neurosurgery;  Laterality: N/A;  . LAPAROSCOPIC CHOLECYSTECTOMY  05/07/2016  . LAPAROSCOPIC LYSIS OF ADHESIONS  05/07/2016  . LAPAROSCOPIC LYSIS OF ADHESIONS N/A 05/07/2016   Procedure: LAPAROSCOPIC LYSIS OF ADHESIONS TIMES ONE HOUR;  Surgeon: Fanny Skates, MD;  Location: [facility];  Service: General;  Laterality: N/A;  . POSTERIOR FUSION THORACIC SPINE  02/08/2016   1. Posterior thoracic arthrodesis T7-T11 utilizing morcellized allograft, 2. Posterior thoracic segmental fixation T7-T11 utilizing nuvasive pedicle screws  . PROSTATECTOMY  06/2001   w/bilateral pelvic lymph nose dissection/notes 10/24/2010  . SPINAL FUSION  12/2014   Open reduction internal fixation of L1 Chance fracture with posterior fusion T10-L4 utilizing morcellized allograft and some local autograft, segmental instrumentation T10-L4 inclusive utilizing nuvasive pedicle screws/notes 12/16/2014  . Stress Cardiolite  02/17/2003  . THOROCOTOMY WITH LOBECTOMY  03/16/2015    Procedure: THOROCOTOMY WITH LOBECTOMY;  Surgeon: Ivin Poot, MD;  Location: [facility];  Service: Thoracic;;  . TONSILLECTOMY    . TOTAL THYROIDECTOMY  1997  . Venous Doppler  05/30/2004  . VENTRAL HERNIA REPAIR  04/14/2008  . VIDEO ASSISTED THORACOSCOPY Left 03/16/2015   Procedure: VIDEO ASSISTED THORACOSCOPY;  Surgeon: Ivin Poot, MD;  Location: [facility];  Service: Thoracic;  Laterality: Left;   Family History  Problem Relation Age of Onset  . CAD Mother   . Cancer Neg Hx        No cancer in the patient's immediate family, except of course for the patient himself, as noted    Social History:  reports that he quit smoking about 30 years ago. His smoking use included cigarettes. He has a 60.00 pack-year smoking history. He has never used smokeless tobacco. He reports previous alcohol use. He reports that he does not use drugs.    Allergies: No Known Allergies    Medications Prior to Admission  Medication Sig Dispense Refill  . albuterol (VENTOLIN HFA) 108 (90 Base) MCG/ACT inhaler Inhale 2 puffs into the lungs every 6 (six) hours as needed for wheezing or shortness of breath. 8 g 6  . ALPRAZolam (XANAX) 0.5 MG tablet Take 0.5 mg by mouth 2 (two) times daily as needed for anxiety.    Marland Kitchen  amoxicillin-clavulanate (AUGMENTIN) 875-125 MG tablet Take 1 tablet by mouth every 12 (twelve) hours.    Marland Kitchen apixaban (ELIQUIS) 5 MG TABS tablet Take 5 mg by mouth 2 (two) times daily.    Marland Kitchen atenolol (TENORMIN) 25 MG tablet Take 1 tablet (25 mg total) by mouth 2 (two) times daily.    . busPIRone (BUSPAR) 15 MG tablet Take 15 mg by mouth 2 (two) times daily.     . calcium-vitamin D (OSCAL WITH D) 500-200 MG-UNIT tablet Take 2 tablets by mouth daily.     . hydrochlorothiazide (HYDRODIURIL) 12.5 MG tablet Take 12.5 mg by mouth every morning.    Marland Kitchen HYDROcodone-acetaminophen (NORCO/VICODIN) 5-325 MG tablet Take 1 tablet by mouth every 6 (six) hours as needed (pain).    . insulin lispro (HUMALOG KWIKPEN) 100  UNIT/ML KiwkPen INJECT UNDER THE SKIN THREE TIMES DAILY( 25 UNITS, 25 UNITS AND 20 UNITS RESPECTIVELY BEFORE MEALS) (Patient taking differently: Inject 20-30 Units into the skin See admin instructions. Inject 25 units subcutaneously before breakfast, 20 units before lunch and 30 units before supper) 30 mL 5  . levothyroxine (SYNTHROID) 125 MCG tablet Take 1 tablet (125 mcg total) by mouth daily before breakfast. 30 tablet 0  . Mesalamine 800 MG TBEC Take 1,600 mg by mouth 2 (two) times daily.    . methocarbamol (ROBAXIN) 500 MG tablet Take 500 mg by mouth every 8 (eight) hours.    . Multiple Vitamins-Iron (DAILY-VITE/IRON/BETA-CAROTENE) TABS Take 1 tablet by mouth daily.     . Tiotropium Bromide Monohydrate (SPIRIVA RESPIMAT) 2.5 MCG/ACT AERS Inhale 2 puffs into the lungs daily. 4 g 0  . zinc gluconate 50 MG tablet Take 50 mg by mouth daily.    . B-D ULTRAFINE III SHORT PEN 31G X 8 MM MISC USE 1 PEN NEEDLE FOUR TIMES DAILY. 100 each 0  . diltiazem (CARDIZEM) 30 MG tablet Take 1 tablet every 4 hours AS NEEDED for heart rate >100 as long as blood pressure >100. (Patient not taking: No sig reported) 45 tablet 1  . Glucose Blood (ASCENSIA CONTOUR TEST VI) Use as directed to test two times a day    . glucose blood (BAYER CONTOUR TEST) test strip TEST BLOOD SUGAR TWICE DAILY AS DIRECTED. 200 each 2  . glucose blood test strip 1 each by Other route 2 times daily.    . Insulin Pen Needle 31G X 8 MM MISC Inject 31 g into the skin.    Marland Kitchen leuprolide (LUPRON) 11.25 MG injection Inject 11.25 mg into the muscle every 6 (six) months.       Drug Regimen Review { DRUG REGIMEN [id number]}  Home: Home Living Family/patient expects to be discharged to:: Inpatient rehab Living Arrangements: Spouse/significant other Type of Home: House Home Access: Stairs to enter Home Layout: One level Bathroom Shower/Tub: Multimedia programmer: Handicapped height Bathroom Accessibility: Yes Additional Comments:  daughter present at time of this eval.  Lives With: Spouse   Functional History: Prior Function Level of Independence: Independent  Functional Status:  Mobility: Bed Mobility Overal bed mobility: Needs Assistance Bed Mobility: Rolling,Sidelying to Sit Rolling: Min assist Sidelying to sit: Min assist,HOB elevated Supine to sit: +2 for physical assistance,Mod assist,HOB elevated Sit to supine: +2 for physical assistance,Mod assist Sit to sidelying: Min assist General bed mobility comments: Min A for elevating trunk Transfers Overall transfer level: Needs assistance Equipment used: Rolling walker (2 wheeled) Transfers: Sit to/from Stand Sit to Stand: [employer] safety/equipment Stand pivot transfers: [employer]  physical assistance,+2 safety/equipment General transfer comment: min +2 for rise, steady, and lines/leads. Pt with posterior bias in standing, corrected with continued standing Ambulation/Gait Ambulation/Gait assistance: Min assist,+2 safety/equipment Gait Distance (Feet): 12 Feet (+ 49ft) Assistive device: Rolling walker (2 wheeled) Gait Pattern/deviations: Step-through pattern,Decreased stride length,Shuffle,Trunk flexed,Narrow base of support General Gait Details: minA at times to steady, cues for positioning in RW and max encouragement. low SpO2 (to 70s) with gait, improved with increase in O2 from 35% and 8L to 40% and 10L. cues for posture and positioning in RW Gait velocity: decr Gait velocity interpretation: <1.31 ft/sec, indicative of household ambulator    ADL: ADL Overall ADL's : Needs assistance/impaired Eating/Feeding: NPO Grooming: Maximal assistance,Sitting Lower Body Bathing: Maximal assistance,Sit to/from stand Lower Body Bathing Details (indicate cue type and reason): Max A for washing feet Upper Body Dressing : Moderate assistance,Sitting Upper Body Dressing Details (indicate cue type and reason): Pt partially donning brace. Requiring Mod A for  positioning and stabilizing brace Lower Body Dressing: Minimal assistance Lower Body Dressing Details (indicate cue type and reason): Using figure four to don sock. Min A for intiating over toes Toilet Transfer: Minimal assistance,+2 for safety/equipment,Ambulation,RW (simulated to recliner) Toilet Transfer Details (indicate cue type and reason): MIn A for support and RW management. Functional mobility during ADLs: Rolling walker,Cueing for safety,Minimal assistance,+2 for physical assistance,+2 for safety/equipment General ADL Comments: Pt performing LB dressing, brace management, and functional mobility in room  Cognition: Cognition Overall Cognitive Status: Difficult to assess Orientation Level: Intubated/Tracheostomy - Unable to assess Cognition Arousal/Alertness: Awake/alert Behavior During Therapy: WFL for tasks assessed/performed Overall Cognitive Status: Difficult to assess Area of Impairment: Memory,Following commands Memory: Decreased recall of precautions Following Commands: Follows one step commands with increased time General Comments: Requiring increased time. Fatigues quickly and then can become flat. Difficult to assess due to: Tracheostomy (PMV donned for part of session)   Blood pressure (!) 109/52, pulse 70, temperature 97.8 F (36.6 C), temperature source Oral, resp. rate 18, height 5' 9.02" (1.753 m), weight 89.3 kg, SpO2 93 %. Physical Exam Vitals and nursing note reviewed.  Neck:     Comments: Cuffed #6 trach in place with dried secretions.  Pulmonary:     Breath sounds: Decreased air movement present. Examination of the left-upper field reveals rales. Examination of the left-middle field reveals rales. Rales present.  Abdominal:     General: There is distension.  Musculoskeletal:        General: Swelling present.     Comments: 3+ pedal edema.   Skin:    General: Skin is dry.     Coloration: Skin is pale.  Neurological:     Mental Status: He is easily  aroused. He is lethargic.     Comments: Dysphonic.      Results for orders placed or performed during the hospital encounter of 09/10/20 (from the past 48 hour(s))  Glucose, capillary     Status: Abnormal   Collection Time: 10/04/20 12:39 PM  Result Value Ref Range   Glucose-Capillary 130 (H) 70 - 99 mg/dL    Comment: Glucose reference range applies only to samples taken after fasting for at least 8 hours.  Glucose, capillary     Status: None   Collection Time: 10/04/20  7:08 PM  Result Value Ref Range   Glucose-Capillary 78 70 - 99 mg/dL    Comment: Glucose reference range applies only to samples taken after fasting for at least 8 hours.  Glucose, capillary  Status: Abnormal   Collection Time: 10/04/20 11:46 PM  Result Value Ref Range   Glucose-Capillary 151 (H) 70 - 99 mg/dL    Comment: Glucose reference range applies only to samples taken after fasting for at least 8 hours.  CBC with Differential/Platelet     Status: Abnormal   Collection Time: 10/05/20  2:36 AM  Result Value Ref Range   WBC 15.8 (H) 4.0 - 10.5 K/uL   RBC 2.78 (L) 4.22 - 5.81 MIL/uL   Hemoglobin 8.2 (L) 13.0 - 17.0 g/dL   HCT 26.5 (L) 39.0 - 52.0 %   MCV 95.3 80.0 - 100.0 fL   MCH 29.5 26.0 - 34.0 pg   MCHC 30.9 30.0 - 36.0 g/dL   RDW 13.5 11.5 - 15.5 %   Platelets 499 (H) 150 - 400 K/uL   nRBC 0.0 0.0 - 0.2 %   Neutrophils Relative % 95 %   Neutro Abs 15.0 (H) 1.7 - 7.7 K/uL   Lymphocytes Relative 3 %   Lymphs Abs 0.5 (L) 0.7 - 4.0 K/uL   Monocytes Relative 1 %   Monocytes Absolute 0.2 0.1 - 1.0 K/uL   Eosinophils Relative 0 %   Eosinophils Absolute 0.0 0.0 - 0.5 K/uL   Basophils Relative 0 %   Basophils Absolute 0.0 0.0 - 0.1 K/uL   Immature Granulocytes 1 %   Abs Immature Granulocytes 0.15 (H) 0.00 - 0.07 K/uL    Comment: Performed at [facility], [address], [place], [place] [address]  Basic metabolic panel     Status: Abnormal   Collection Time: 10/05/20  2:36 AM  Result Value  Ref Range   Sodium 131 (L) 135 - 145 mmol/L   Potassium 6.2 (H) 3.5 - 5.1 mmol/L   Chloride 92 (L) 98 - 111 mmol/L   CO2 31 22 - 32 mmol/L   Glucose, Bld 235 (H) 70 - 99 mg/dL    Comment: Glucose reference range applies only to samples taken after fasting for at least 8 hours.   BUN 56 (H) 8 - 23 mg/dL   Creatinine, Ser 1.34 (H) 0.61 - 1.24 mg/dL   Calcium 8.5 (L) 8.9 - 10.3 mg/dL   GFR, Estimated 55 (L) >60 mL/min    Comment: (NOTE) Calculated using the CKD-EPI Creatinine Equation (2021)    Anion gap 8 5 - 15    Comment: Performed at [facility] [address] [place], [place] [address]  Glucose, capillary     Status: Abnormal   Collection Time: 10/05/20  3:06 AM  Result Value Ref Range   Glucose-Capillary 256 (H) 70 - 99 mg/dL    Comment: Glucose reference range applies only to samples taken after fasting for at least 8 hours.  Glucose, capillary     Status: Abnormal   Collection Time: 10/05/20  7:47 AM  Result Value Ref Range   Glucose-Capillary 305 (H) 70 - 99 mg/dL    Comment: Glucose reference range applies only to samples taken after fasting for at least 8 hours.  Basic metabolic panel     Status: Abnormal   Collection Time: 10/05/20  7:55 AM  Result Value Ref Range   Sodium 130 (L) 135 - 145 mmol/L   Potassium 7.0 (HH) 3.5 - 5.1 mmol/L    Comment: NO VISIBLE HEMOLYSIS CRITICAL RESULT CALLED TO, READ BACK BY AND VERIFIED WITH: M.SURLES RN [id number] 10/05/20 MCCORMICK K    Chloride 91 (L) 98 - 111 mmol/L  CO2 32 22 - 32 mmol/L   Glucose, Bld 360 (H) 70 - 99 mg/dL    Comment: Glucose reference range applies only to samples taken after fasting for at least 8 hours.   BUN 60 (H) 8 - 23 mg/dL   Creatinine, Ser 1.44 (H) 0.61 - 1.24 mg/dL   Calcium 8.5 (L) 8.9 - 10.3 mg/dL   GFR, Estimated 50 (L) >60 mL/min    Comment: (NOTE) Calculated using the CKD-EPI Creatinine Equation (2021)    Anion gap 7 5 - 15    Comment: Performed at [facility] [address] [place], [place] [address]  Basic metabolic panel     Status: Abnormal   Collection Time: 10/05/20 12:00 PM  Result Value Ref Range   Sodium 133 (L) 135 - 145 mmol/L   Potassium 6.3 (HH) 3.5 - 5.1 mmol/L    Comment: NO VISIBLE HEMOLYSIS CRITICAL RESULT CALLED TO, READ BACK BY AND VERIFIED WITH: M.SURLES RN 1252 10/05/20 MCCORMICK K    Chloride 89 (L) 98 - 111 mmol/L   CO2 36 (H) 22 - 32 mmol/L   Glucose, Bld 269 (H) 70 - 99 mg/dL    Comment: Glucose reference range applies only to samples taken after fasting for at least 8 hours.   BUN 66 (H) 8 - 23 mg/dL   Creatinine, Ser 1.49 (H) 0.61 - 1.24 mg/dL   Calcium 8.5 (L) 8.9 - 10.3 mg/dL   GFR, Estimated 48 (L) >60 mL/min    Comment: (NOTE) Calculated using the CKD-EPI Creatinine Equation (2021)    Anion gap 8 5 - 15    Comment: Performed at [facility] [address] [place], [place] [address]  Glucose, capillary     Status: Abnormal   Collection Time: 10/05/20 12:13 PM  Result Value Ref Range   Glucose-Capillary 291 (H) 70 - 99 mg/dL    Comment: Glucose reference range applies only to samples taken after fasting for at least 8 hours.  Glucose, capillary     Status: Abnormal   Collection Time: 10/05/20  4:24 PM  Result Value Ref Range   Glucose-Capillary 138 (H) 70 - 99 mg/dL    Comment: Glucose reference range applies only to samples taken after fasting for at least 8 hours.  Potassium     Status: Abnormal   Collection Time: 10/05/20  4:46 PM  Result Value Ref Range   Potassium 5.4 (H) 3.5 - 5.1 mmol/L    Comment: Performed at [facility], [place] [address] [place], [place] [address]  Glucose, capillary     Status: None   Collection Time: 10/05/20  7:56 PM  Result Value Ref Range   Glucose-Capillary 98 70 - 99 mg/dL    Comment: Glucose reference range applies only to samples taken after fasting for at least 8 hours.  Glucose, capillary     Status: Abnormal   Collection Time: 10/05/20 11:34 PM  Result Value Ref  Range   Glucose-Capillary 110 (H) 70 - 99 mg/dL    Comment: Glucose reference range applies only to samples taken after fasting for at least 8 hours.  CBC with Differential/Platelet     Status: Abnormal   Collection Time: 10/06/20  1:51 AM  Result Value Ref Range   WBC 16.8 (H) 4.0 - 10.5 K/uL   RBC 2.60 (L) 4.22 - 5.81 MIL/uL   Hemoglobin 7.7 (L) 13.0 - 17.0 g/dL   HCT 25.6 (L) 39.0 - 52.0 %  MCV 98.5 80.0 - 100.0 fL   MCH 29.6 26.0 - 34.0 pg   MCHC 30.1 30.0 - 36.0 g/dL   RDW 13.4 11.5 - 15.5 %   Platelets 554 (H) 150 - 400 K/uL   nRBC 0.0 0.0 - 0.2 %   Neutrophils Relative % 82 %   Neutro Abs 13.7 (H) 1.7 - 7.7 K/uL   Lymphocytes Relative 6 %   Lymphs Abs 1.1 0.7 - 4.0 K/uL   Monocytes Relative 11 %   Monocytes Absolute 1.8 (H) 0.1 - 1.0 K/uL   Eosinophils Relative 0 %   Eosinophils Absolute 0.0 0.0 - 0.5 K/uL   Basophils Relative 0 %   Basophils Absolute 0.0 0.0 - 0.1 K/uL   Immature Granulocytes 1 %   Abs Immature Granulocytes 0.21 (H) 0.00 - 0.07 K/uL    Comment: Performed at [facility] [address] [place], [place] [address]  Basic metabolic panel     Status: Abnormal   Collection Time: 10/06/20  1:51 AM  Result Value Ref Range   Sodium 134 (L) 135 - 145 mmol/L   Potassium 5.1 3.5 - 5.1 mmol/L   Chloride 89 (L) 98 - 111 mmol/L   CO2 38 (H) 22 - 32 mmol/L   Glucose, Bld 116 (H) 70 - 99 mg/dL    Comment: Glucose reference range applies only to samples taken after fasting for at least 8 hours.   BUN 77 (H) 8 - 23 mg/dL   Creatinine, Ser 1.47 (H) 0.61 - 1.24 mg/dL   Calcium 8.9 8.9 - 10.3 mg/dL   GFR, Estimated 49 (L) >60 mL/min    Comment: (NOTE) Calculated using the CKD-EPI Creatinine Equation (2021)    Anion gap 7 5 - 15    Comment: Performed at [facility] [address] [place], [place] [address]  Glucose, capillary     Status: Abnormal   Collection Time: 10/06/20  3:49 AM  Result Value Ref Range   Glucose-Capillary 110 (H) 70 - 99 mg/dL     Comment: Glucose reference range applies only to samples taken after fasting for at least 8 hours.  Glucose, capillary     Status: Abnormal   Collection Time: 10/06/20  8:07 AM  Result Value Ref Range   Glucose-Capillary 123 (H) 70 - 99 mg/dL    Comment: Glucose reference range applies only to samples taken after fasting for at least 8 hours.   DG Abd 1 View  Result Date: 10/04/2020 CLINICAL DATA:  Feeding tube placement. EXAM: ABDOMEN - 1 VIEW COMPARISON:  09/16/2020. FINDINGS: Interim removal of NG tube and placement of feeding tube. Feeding tube tip noted over the stomach. Basilar atelectasis. Left pleural effusion cannot be excluded. Postsurgical changes thoracolumbar spine. Surgical clips noted over the abdomen. IMPRESSION: Interim removal of NG tube and placement of feeding tube. Feeding tube tip noted over the stomach. Electronically Signed   By: Marcello Moores  Register   On: 10/04/2020 12:23       Medical Problem List and Plan: 1.  *** secondary to ***  -patient may *** shower  -ELOS/Goals: *** 2.  Antithrombotics: -DVT/anticoagulation:  Mechanical: Sequential compression devices, below knee Bilateral lower extremities  -antiplatelet therapy: N/A 3. Pain Management: Oxycodone prn 4. Mood: LCSW to follow for evaluation and support.   -antipsychotic agents: N/a 5. Neuropsych: This patient is not fully capable of making decisions on his own behalf. 6. Skin/Wound Care: Routine pressure relief measures 7. Fluids/Electrolytes/Nutrition: Strict I/O with  daily weights. Prosource bid.   --Nephro tube feeds with free water 100 cc tid.  8. T11 chance fracture s/p  9. VDRF/COPD/hypercarbic respiratory failure: Continue oxygen per ATC and titrate with activity  ---frequent suctioning with mucomyst to help manage secretions.   --Robitussin DM TID as well as Pulmicort/Yupelri nebs  10. Chronic  A fib: Monitor HR tid--continue Cardizem qid  --Eliquis continues to be on hold 11. Anxiety d/o:  Continue buspar bid 12. T2DM: On Lantus 12 units bid with SSI for elevated BS  --Monitor BS every 4 hours and titrate insulin as indicated.  13. Acute on chronic renal failure: Baseline SCr 1.4 but BUN on rise-->may need to stay dry.   -- monitor daily weights (inconsistent) 181-->196 lbs overnight and ? Accurate.  14.  Hyperkalemia: K+ 5.1 today-> will continue daily BMET.   --appreciate nephrology assistance.  15. Crohn's disease: Rectal tube to manage diarrhea  --continue lomotil qid. Will add probiotic.  16. Soft tissue neck mass: S/p biopsy 04/27 per Dr/ Skotnicki 17. Urinary retention: Scanned for 500+ cc at admission--will start bladder program.     ***  Bary Leriche, PA-C 10/06/2020

## 2020-10-06 NOTE — Progress Notes (Signed)
[name], RN  Rehab Admission Coordinator  Physical Medicine and Rehabilitation  PMR Pre-admission     Signed  Date of Service:  10/05/2020  7:48 PM      Related encounter: ED to Hosp-Admission (Discharged) from 09/10/2020 in [place]          Show:Clear all [x] Manual[x] Template[x] Copied  Added by: [x] [name], RN   [] Hover for details  PMR Admission Coordinator Pre-Admission Assessment   Patient: Devin [name] is an 80 y.o., male MRN: [id number] DOB: 1940-10-02 Height: 5' 9.02" (175.3 cm) Weight: 89.3 kg                                                                                                                                                  Insurance Information HMO: yes    PPO:      PCP:      IPA:      80/20:      OTHER:  PRIMARY: [employer]      Policy#: [id number]      Subscriber: pt CM Name: denial overturned on appeal per Jess      Phone#: [phone] option 7     Fax#: [phone] Pre-Cert#: [id number] approved for 7 days      Employer:  Benefits:  Phone #: [phone]     Name: 4/28 Eff. Date: 06/10/2020     Deduct: none      Out of Pocket Max: $3600      Life Max: none  CIR: $295 co pay per day days 1 until 6      SNF: no copay days 1 until 20; $188 co pay per day days 21 until 49; no copay days 41 until 100 Outpatient: $30 per visit     Co-Pay: visits per medical neccesity Home Health: 100%      Co-Pay: visits per medical neccesity DME: 80%     Co-Pay: 20% Providers: in network  SECONDARY: in network      Policy#:       Phone#:    Development worker, community:       Phone#:    The Engineer, petroleum" for patients in Inpatient Rehabilitation Facilities with attached "Privacy Act [place] Records" was provided and verbally reviewed with: Patient and Family   Emergency Contact Information         Contact Information     Name Relation Home Work Mobile     Blanche Spouse [phone]   [phone]    [facility] Daughter     [phone]    Jacalyn Lefevre Granddaughter     [phone]       Current Medical History  Patient Admitting Diagnosis: Debility   History of Present Illness: 80 y.o. male with history of A fib, Crohn's, lung  cancer s/p RSXN, thyroid cancer, prostate cancer,  T2DM, COPD, ankylosing spondylitis with chronic LBP and gait disorder who sustained a fall on 09/07/2020 with progressive pain.  History taken from chart review and patient due to trach.  He was admitted on 09/10/2020 with ongoing pain, shortness of breath, and agitation. He became unresponsive in ED requiring intubation for acute on chronic hypercarbic respiratory failure. CT chest revealed acute T11 vertebral fracture and TLSO recommended by Dr. Ronnald Ramp. Once cleared by PCCM, he underwent posterior thoracic fusion T11-T12 on 09/15/2020 he was unable to tolerate extubation X 2 despite decadron to help with edema and ultimately required tracheostomy on 09/19/2020.  Hospital course further complicated by atrial fibrillation with RVR, copious secretions,  Kleb PNA, fluid overload, diarrhea due to crohn's. He was started on weaning trials and has been off vent since 04/18. He was started on PMSV trials and therapy ongoing.   On 4/22 failed barium swallow evaluation. Per SLP, noted thickening of the prevertebral tissue at the level of C 4-5. CT cervical spine recommended and showed soft tissue which raised suspicion of laryngeal carcinoma. ENT, Dr. Fredric Dine consulted. Eliquis held and patient underwent  biopsy 4/27 with results penidng. Some worsening of secretions noted. Began on Robitussin /DM syrup, Mucomyst, albuterol, hypertonic saline and chest PT.    Nephrology consulted 4/28 for hyperkalemia of 7.0. Nephrology recommended Kayexalate, insulin and glucose, sodium bicarb. and repeat BMP. IV lasix given and transitioned tube feeds to Nepro. Baseline renal function Cr 1.3-  1.4. Optimize volume and glucose control. K 5.1 on 4/29. Patient at baseline uses extended release po meds which he is unable to take po at this time for his Chron's.   Past Medical History      Past Medical History:  Diagnosis Date  . A-fib ([place])    . Adenocarcinoma of left lung, stage 1 ([place]) 03/10/2015  . Ankylosing spondylitis ([place])      Diagnosed during lumbar fracture summer of 2016    . Crohn's disease ([place])    . Gout    . HIstory of basal cell cancer of face      THYROID CA HX  . History of kidney stones    . Hypertension    . Hypothyroidism    . Impotence    . 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 Obesity (BMI 30-39.9)    . Osteoporosis      Pt completed 5 years of fosamax in 2013     . Prostate cancer with recurrence      Treated with prostatectomy with recurrence 2012 with Lupron treatment now him   . Psoriasis    . Thyroid cancer ([place]) 1999    Treated with RAI and total thyroidectomy   . Type II diabetes mellitus ([facility])        Family History  family history includes CAD in his mother.   Prior Rehab/Hospitalizations:  Has the patient had prior rehab or hospitalizations prior to admission? yes   Has the patient had major surgery during 100 days prior to admission? Yes   Current Medications    Current Facility-Administered Medications:  .  0.9 %  sodium chloride infusion, , Intravenous, PRN, Eustace Moore, MD, Last Rate: 10 mL/hr at 09/23/20 1109, 250 mL at 09/23/20 1109 .  acetaminophen (TYLENOL) 160 MG/5ML solution 500 mg, 500 mg, Per Tube, Q6H, Eustace Moore, MD, 500 mg at 10/06/20 8469 .  acetaminophen (TYLENOL) tablet 650 mg, 650 mg, Oral,  Q4H PRN, 650 mg at 09/26/20 1230 **OR** acetaminophen (TYLENOL) suppository 650 mg, 650 mg, Rectal, Q4H PRN, Eustace Moore, MD .  acetylcysteine (MUCOMYST) 20 % nebulizer / oral solution 600 mg, 600 mg, Nebulization, BID, Dahal, Binaya, MD, 600 mg at 10/06/20 0827 .  albuterol (PROVENTIL) (2.5  MG/3ML) 0.083% nebulizer solution 2.5 mg, 2.5 mg, Nebulization, Q4H PRN, Dahal, Binaya, MD, 2.5 mg at 10/05/20 1943 .  ALPRAZolam Duanne Moron) tablet 0.5 mg, 0.5 mg, Per Tube, BID PRN, Kipp Brood, MD, 0.5 mg at 10/03/20 1702 .  atenolol (TENORMIN) tablet 25 mg, 25 mg, Per NG tube, BID, Erick Colace, NP, 25 mg at 10/06/20 0926 .  bethanechol (URECHOLINE) tablet 5 mg, 5 mg, Per NG tube, QID, Erick Colace, NP, 5 mg at 10/06/20 0925 .  budesonide (PULMICORT) nebulizer solution 0.5 mg, 0.5 mg, Nebulization, BID, Eustace Moore, MD, 0.5 mg at 10/06/20 0826 .  busPIRone (BUSPAR) tablet 15 mg, 15 mg, Per Tube, BID, Eustace Moore, MD, 15 mg at 10/06/20 0925 .  chlorhexidine (PERIDEX) 0.12 % solution 15 mL, 15 mL, Mouth/Throat, BID, Dahal, Binaya, MD, 15 mL at 10/06/20 0927 .  cholestyramine (QUESTRAN) packet 4 g, 4 g, Per Tube, BID AC, Dahal, Binaya, MD, 4 g at 10/05/20 1717 .  diltiazem (CARDIZEM) 10 mg/ml oral suspension 30 mg, 30 mg, Per Tube, Q6H, Erick Colace, NP, 30 mg at 10/06/20 4098 .  diphenhydrAMINE (BENADRYL) capsule 25 mg, 25 mg, Per NG tube, QHS PRN, Dahal, Binaya, MD, 25 mg at 10/03/20 2343 .  feeding supplement (NEPRO CARB STEADY) liquid 1,000 mL, 1,000 mL, Per Tube, Continuous, Dahal, Binaya, MD, Last Rate: 50 mL/hr at 10/05/20 1643, 1,000 mL at 10/05/20 1643 .  feeding supplement (PROSource TF) liquid 45 mL, 45 mL, Per Tube, Daily, Dahal, Binaya, MD, 45 mL at 10/06/20 0925 .  fiber (NUTRISOURCE FIBER) 1 packet, 1 packet, Per Tube, BID, Erick Colace, NP, 1 packet at 10/06/20 [phone] .  free water 200 mL, 200 mL, Per Tube, Q6H, Chand, Sudham, MD, 200 mL at 10/06/20 0600 .  Gerhardt's butt cream, , Topical, BID, Kipp Brood, MD, 1 application at 47/82/95 0926 .  guaiFENesin-dextromethorphan (ROBITUSSIN DM) 100-10 MG/5ML syrup 5 mL, 5 mL, Per Tube, Q8H, Dahal, Binaya, MD, 5 mL at 10/06/20 0622 .  insulin aspart (novoLOG) injection 0-20 Units, 0-20 Units, Subcutaneous, Q4H,  Agarwala, Ravi, MD, 3 Units at 10/06/20 0900 .  insulin glargine (LANTUS) injection 12 Units, 12 Units, Subcutaneous, BID, Dahal, Marlowe Aschoff, MD, 12 Units at 10/06/20 0922 .  levothyroxine (SYNTHROID) tablet 125 mcg, 125 mcg, Per Tube, QAC breakfast, Eustace Moore, MD, 125 mcg at 10/06/20 0622 .  loperamide HCl (IMODIUM) 1 MG/7.5ML suspension 2 mg, 2 mg, Per Tube, BID PRN, Margaretha Seeds, MD, 2 mg at 09/25/20 2338 .  loperamide HCl (IMODIUM) 1 MG/7.5ML suspension 4 mg, 4 mg, Per Tube, QID, Dahal, Binaya, MD, 4 mg at 10/05/20 2151 .  MEDLINE mouth rinse, 15 mL, Mouth Rinse, 10 times per day, Eustace Moore, MD, 15 mL at 10/06/20 6213 .  nutrition supplement (JUVEN) (JUVEN) powder packet 1 packet, 1 packet, Per Tube, BID BM, Margaretha Seeds, MD, 1 packet at 10/06/20 [phone] .  ondansetron (ZOFRAN) injection 4 mg, 4 mg, Intravenous, Q6H PRN, Margaretha Seeds, MD, 4 mg at 09/24/20 1208 .  oxyCODONE (ROXICODONE) 5 MG/5ML solution 5 mg, 5 mg, Per Tube, Q4H PRN, Dahal, Binaya, MD, 5 mg at 10/05/20 1718 .  phenol (CHLORASEPTIC) mouth spray 1 spray, 1 spray, Mouth/Throat, PRN, Bowser, Grace E, NP, 1 spray at 09/25/20 1019 .  revefenacin (YUPELRI) nebulizer solution 175 mcg, 175 mcg, Nebulization, Daily, Margaretha Seeds, MD, 175 mcg at 10/05/20 0757   Patients Current Diet:     Diet Order                      Diet NPO time specified  Diet effective now                      Precautions / Restrictions Precautions Precautions: Fall,Other (comment),Back Precaution Booklet Issued: No Precaution Comments: rectal tube, trach collar, cortrak Spinal Brace: Thoracolumbosacral orthotic,Applied in sitting position Restrictions Weight Bearing Restrictions: No    Has the patient had 2 or more falls or a fall with injury in the past year?Yes   Prior Activity Level Community (5-7x/wk): independent, active and driving   Prior Functional Level Prior Function Level of Independence: Independent   Self  Care: Did the patient need help bathing, dressing, using the toilet or eating?  Independent   Indoor Mobility: Did the patient need assistance with walking from room to room (with or without device)? Independent   Stairs: Did the patient need assistance with internal or external stairs (with or without device)? Independent   Functional Cognition: Did the patient need help planning regular tasks such as shopping or remembering to take medications? Independent   Home Assistive Devices / Equipment Home Assistive Devices/Equipment: None   Prior Device Use: Indicate devices/aids used by the patient prior to current illness, exacerbation or injury? None of the above   Current Functional Level Cognition   Overall Cognitive Status: Difficult to assess Difficult to assess due to: Tracheostomy (PMV donned for part of session) Orientation Level: Intubated/Tracheostomy - Unable to assess Following Commands: Follows one step commands with increased time General Comments: Requiring increased time. Fatigues quickly and then can become flat.    Extremity Assessment (includes Sensation/Coordination)   Upper Extremity Assessment: RUE deficits/detail,LUE deficits/detail RUE Deficits / Details: edema noted with weeping, skin tear noted on the medial side of the inner arm. RUE Coordination: decreased fine motor LUE Deficits / Details: noted to have edema with weeping. pt with pocket of fluid under blood pressure cuff above elbow LUE Coordination: decreased fine motor,decreased gross motor  Lower Extremity Assessment: Defer to PT evaluation RLE Deficits / Details: Difficulty obtaining full AROM knee extension against gravity or obtaining full hip and knee extension in standing without physical assistance RLE Coordination: decreased gross motor LLE Deficits / Details: Difficulty obtaining full AROM knee extension against gravity or obtaining full hip and knee extension in standing without physical  assistance LLE Coordination: decreased gross motor     ADLs   Overall ADL's : Needs assistance/impaired Eating/Feeding: NPO Grooming: Maximal assistance,Sitting Lower Body Bathing: Maximal assistance,Sit to/from stand Lower Body Bathing Details (indicate cue type and reason): Max A for washing feet Upper Body Dressing : Moderate assistance,Sitting Upper Body Dressing Details (indicate cue type and reason): Pt partially donning brace. Requiring Mod A for positioning and stabilizing brace Lower Body Dressing: Minimal assistance Lower Body Dressing Details (indicate cue type and reason): Using figure four to don sock. Min A for intiating over toes Toilet Transfer: Minimal assistance,+2 for safety/equipment,Ambulation,RW (simulated to recliner) Toilet Transfer Details (indicate cue type and reason): MIn A for support and RW management. Functional mobility during ADLs: Rolling walker,Cueing for safety,Minimal assistance,+2 for physical assistance,+2 for  safety/equipment General ADL Comments: Pt performing LB dressing, brace management, and functional mobility in room     Mobility   Overal bed mobility: Needs Assistance Bed Mobility: Rolling,Sidelying to Sit Rolling: Min assist Sidelying to sit: Min assist,HOB elevated Supine to sit: +2 for physical assistance,Mod assist,HOB elevated Sit to supine: +2 for physical assistance,Mod assist Sit to sidelying: Min assist General bed mobility comments: Min A for elevating trunk     Transfers   Overall transfer level: Needs assistance Equipment used: Rolling walker (2 wheeled) Transfers: Sit to/from Stand Sit to Stand: [employer] safety/equipment Stand pivot transfers: Min assist,+2 physical assistance,+2 safety/equipment General transfer comment: min +2 for rise, steady, and lines/leads. Pt with posterior bias in standing, corrected with continued standing     Ambulation / Gait / Stairs / Wheelchair Mobility    Ambulation/Gait Ambulation/Gait assistance: Min assist,+2 safety/equipment Gait Distance (Feet): 12 Feet (+ 11f) Assistive device: Rolling walker (2 wheeled) Gait Pattern/deviations: Step-through pattern,Decreased stride length,Shuffle,Trunk flexed,Narrow base of support General Gait Details: minA at times to steady, cues for positioning in RW and max encouragement. low SpO2 (to 70s) with gait, improved with increase in O2 from 35% and 8L to 40% and 10L. cues for posture and positioning in RW Gait velocity: decr Gait velocity interpretation: <1.31 ft/sec, indicative of household ambulator     Posture / Balance Dynamic Sitting Balance Sitting balance - Comments: Pt with preference for UE support on bed. Balance Overall balance assessment: Needs assistance Sitting-balance support: Bilateral upper extremity supported,Feet supported,Single extremity supported Sitting balance-Leahy Scale: Fair Sitting balance - Comments: Pt with preference for UE support on bed. Standing balance support: Bilateral upper extremity supported,During functional activity Standing balance-Leahy Scale: Poor Standing balance comment: Reliant on bil UE support and external assist.     Special needs/care consideration #6 flexible cuffed trach placed 4/26 43 inch 10 fr left nare placed 4/11 Rectal tube ; history of Chron's and unable to take his home po medication regimen    Previous Home Environment  Living Arrangements: Spouse/significant other  Lives With: Spouse Type of Home: House Home Layout: One level Home Access: Stairs to enter BConocoPhillipsShower/Tub: WMultimedia programmer Handicapped height Bathroom Accessibility: Yes HParke No Additional Comments: daughter present at time of this eval.   Discharge Living Setting Plans for Discharge Living Setting: Patient's home,Lives with (comment) (wife) Type of Home at Discharge: House Discharge Home Layout: One level Discharge Home Access:  Stairs to enter Entrance Stairs-Rails: Right Entrance Stairs-Number of Steps: 3 Discharge Bathroom Shower/Tub: Walk-in shower Discharge Bathroom Toilet: Handicapped height Discharge Bathroom Accessibility: Yes How Accessible: Accessible via walker Does the patient have any problems obtaining your medications?: No   Social/Family/Support Systems Contact Information: wife and 2 daughters Anticipated Caregiver: wife and 2 daughters Anticipated Caregiver's Contact Information: see above Ability/Limitations of Caregiver: none Caregiver Availability: 24/7 Discharge Plan Discussed with Primary Caregiver: Yes Is Caregiver In Agreement with Plan?: Yes Does Caregiver/Family have Issues with Lodging/Transportation while Pt is in Rehab?: No   Goals Patient/Family Goal for Rehab: supervision to min asisst PT, OT and SLP Expected length of stay: ELOS 2 to 3 weeks Pt/Family Agrees to Admission and willing to participate: Yes Program Orientation Provided & Reviewed with Pt/Caregiver Including Roles  & Responsibilities: Yes   Decrease burden of Care through IP rehab admission: n/a   Possible need for SNF placement upon discharge:not anticipated   Patient Condition: This patient's medical and functional status has changed since the consult dated:09/27/2020 in  which the Rehabilitation Physician determined and documented that the patient's condition is appropriate for intensive rehabilitative care in an inpatient rehabilitation facility. See "History of Present Illness" (above) for medical update. Functional changes are: mod assist. Patient's medical and functional status update has been discussed with the Rehabilitation physician and patient remains appropriate for inpatient rehabilitation. Will admit to inpatient rehab today.   Preadmission Screen Completed By:  Cleatrice Burke, RN, 10/06/2020 10:51 AM ______________________________________________________________________   Discussed status with  Dr. Ranell Patrick on 10/06/2020 at  1055 and received approval for admission today.   Admission Coordinator:  Cleatrice Burke, time 0947 Date 10/06/2020             Cosigned by: Izora Ribas, MD at 10/06/2020 11:11 AM    Revision History                             Note Details  Author [name], RN File Time 10/06/2020 10:55 AM  Author Type Rehab Admission Coordinator Status Signed  Last Editor [name], RN Service Physical Medicine and [place] # [phone] Admit Date 10/06/2020

## 2020-10-06 NOTE — Progress Notes (Signed)
Inpatient Rehabilitation Medication Review by a Pharmacist  A complete drug regimen review was completed for this patient to identify any potential clinically significant medication issues.  Clinically significant medication issues were identified:  None  Check AMION for pharmacist assigned to patient if future medication questions/issues arise during this admission.  Pharmacist comments:  Eliquis currently on hold  Time spent performing this drug regimen review (minutes):  10 minutes   Tad Moore 10/06/2020 2:51 PM

## 2020-10-07 ENCOUNTER — Inpatient Hospital Stay (HOSPITAL_COMMUNITY): Payer: Medicare Other

## 2020-10-07 ENCOUNTER — Inpatient Hospital Stay (HOSPITAL_COMMUNITY)
Admission: AD | Admit: 2020-10-07 | Discharge: 2020-10-12 | DRG: 871 | Disposition: A | Discharge status: Rehabilitation Facility | Payer: Medicare Other | Source: Ambulatory Visit | Attending: Internal Medicine | Admitting: Internal Medicine

## 2020-10-07 DIAGNOSIS — Z981 Arthrodesis status: Secondary | ICD-10-CM

## 2020-10-07 DIAGNOSIS — S22088D Other fracture of T11-T12 vertebra, subsequent encounter for fracture with routine healing: Secondary | ICD-10-CM | POA: Diagnosis not present

## 2020-10-07 DIAGNOSIS — Z20822 Contact with and (suspected) exposure to covid-19: Secondary | ICD-10-CM | POA: Diagnosis present

## 2020-10-07 DIAGNOSIS — Z9049 Acquired absence of other specified parts of digestive tract: Secondary | ICD-10-CM

## 2020-10-07 DIAGNOSIS — Z902 Acquired absence of lung [part of]: Secondary | ICD-10-CM

## 2020-10-07 DIAGNOSIS — J9503 Malfunction of tracheostomy stoma: Secondary | ICD-10-CM | POA: Diagnosis present

## 2020-10-07 DIAGNOSIS — E11649 Type 2 diabetes mellitus with hypoglycemia without coma: Secondary | ICD-10-CM | POA: Diagnosis present

## 2020-10-07 DIAGNOSIS — J189 Pneumonia, unspecified organism: Secondary | ICD-10-CM

## 2020-10-07 DIAGNOSIS — E1129 Type 2 diabetes mellitus with other diabetic kidney complication: Secondary | ICD-10-CM | POA: Diagnosis not present

## 2020-10-07 DIAGNOSIS — M810 Age-related osteoporosis without current pathological fracture: Secondary | ICD-10-CM | POA: Diagnosis present

## 2020-10-07 DIAGNOSIS — J387 Other diseases of larynx: Secondary | ICD-10-CM | POA: Diagnosis present

## 2020-10-07 DIAGNOSIS — I4891 Unspecified atrial fibrillation: Secondary | ICD-10-CM | POA: Diagnosis present

## 2020-10-07 DIAGNOSIS — I480 Paroxysmal atrial fibrillation: Secondary | ICD-10-CM | POA: Diagnosis present

## 2020-10-07 DIAGNOSIS — E162 Hypoglycemia, unspecified: Secondary | ICD-10-CM | POA: Diagnosis not present

## 2020-10-07 DIAGNOSIS — J439 Emphysema, unspecified: Secondary | ICD-10-CM | POA: Diagnosis present

## 2020-10-07 DIAGNOSIS — Z85850 Personal history of malignant neoplasm of thyroid: Secondary | ICD-10-CM | POA: Diagnosis not present

## 2020-10-07 DIAGNOSIS — Z430 Encounter for attention to tracheostomy: Secondary | ICD-10-CM

## 2020-10-07 DIAGNOSIS — J9621 Acute and chronic respiratory failure with hypoxia: Secondary | ICD-10-CM | POA: Diagnosis present

## 2020-10-07 DIAGNOSIS — N179 Acute kidney failure, unspecified: Secondary | ICD-10-CM | POA: Diagnosis present

## 2020-10-07 DIAGNOSIS — I10 Essential (primary) hypertension: Secondary | ICD-10-CM | POA: Diagnosis present

## 2020-10-07 DIAGNOSIS — Z79890 Hormone replacement therapy (postmenopausal): Secondary | ICD-10-CM

## 2020-10-07 DIAGNOSIS — R5381 Other malaise: Secondary | ICD-10-CM | POA: Diagnosis not present

## 2020-10-07 DIAGNOSIS — Z781 Physical restraint status: Secondary | ICD-10-CM

## 2020-10-07 DIAGNOSIS — A4159 Other Gram-negative sepsis: Principal | ICD-10-CM | POA: Diagnosis present

## 2020-10-07 DIAGNOSIS — J9690 Respiratory failure, unspecified, unspecified whether with hypoxia or hypercapnia: Secondary | ICD-10-CM | POA: Diagnosis present

## 2020-10-07 DIAGNOSIS — J9612 Chronic respiratory failure with hypercapnia: Secondary | ICD-10-CM | POA: Diagnosis not present

## 2020-10-07 DIAGNOSIS — C73 Malignant neoplasm of thyroid gland: Secondary | ICD-10-CM | POA: Diagnosis not present

## 2020-10-07 DIAGNOSIS — J90 Pleural effusion, not elsewhere classified: Secondary | ICD-10-CM | POA: Diagnosis present

## 2020-10-07 DIAGNOSIS — Z4659 Encounter for fitting and adjustment of other gastrointestinal appliance and device: Secondary | ICD-10-CM | POA: Diagnosis not present

## 2020-10-07 DIAGNOSIS — G9341 Metabolic encephalopathy: Secondary | ICD-10-CM | POA: Diagnosis present

## 2020-10-07 DIAGNOSIS — N320 Bladder-neck obstruction: Secondary | ICD-10-CM | POA: Diagnosis present

## 2020-10-07 DIAGNOSIS — Z794 Long term (current) use of insulin: Secondary | ICD-10-CM | POA: Diagnosis not present

## 2020-10-07 DIAGNOSIS — K5090 Crohn's disease, unspecified, without complications: Secondary | ICD-10-CM | POA: Diagnosis present

## 2020-10-07 DIAGNOSIS — R1310 Dysphagia, unspecified: Secondary | ICD-10-CM

## 2020-10-07 DIAGNOSIS — Z930 Tracheostomy status: Secondary | ICD-10-CM

## 2020-10-07 DIAGNOSIS — S22009S Unspecified fracture of unspecified thoracic vertebra, sequela: Secondary | ICD-10-CM | POA: Diagnosis not present

## 2020-10-07 DIAGNOSIS — J150 Pneumonia due to Klebsiella pneumoniae: Secondary | ICD-10-CM | POA: Diagnosis present

## 2020-10-07 DIAGNOSIS — M109 Gout, unspecified: Secondary | ICD-10-CM | POA: Diagnosis present

## 2020-10-07 DIAGNOSIS — J9622 Acute and chronic respiratory failure with hypercapnia: Secondary | ICD-10-CM | POA: Diagnosis present

## 2020-10-07 DIAGNOSIS — C3412 Malignant neoplasm of upper lobe, left bronchus or lung: Secondary | ICD-10-CM | POA: Diagnosis present

## 2020-10-07 DIAGNOSIS — M419 Scoliosis, unspecified: Secondary | ICD-10-CM | POA: Diagnosis present

## 2020-10-07 DIAGNOSIS — J386 Stenosis of larynx: Secondary | ICD-10-CM | POA: Diagnosis present

## 2020-10-07 DIAGNOSIS — T17908A Unspecified foreign body in respiratory tract, part unspecified causing other injury, initial encounter: Secondary | ICD-10-CM

## 2020-10-07 DIAGNOSIS — I4811 Longstanding persistent atrial fibrillation: Secondary | ICD-10-CM | POA: Diagnosis not present

## 2020-10-07 DIAGNOSIS — E890 Postprocedural hypothyroidism: Secondary | ICD-10-CM | POA: Diagnosis present

## 2020-10-07 DIAGNOSIS — Z85828 Personal history of other malignant neoplasm of skin: Secondary | ICD-10-CM

## 2020-10-07 DIAGNOSIS — Z8546 Personal history of malignant neoplasm of prostate: Secondary | ICD-10-CM

## 2020-10-07 DIAGNOSIS — J690 Pneumonitis due to inhalation of food and vomit: Secondary | ICD-10-CM

## 2020-10-07 DIAGNOSIS — Z87891 Personal history of nicotine dependence: Secondary | ICD-10-CM

## 2020-10-07 DIAGNOSIS — E871 Hypo-osmolality and hyponatremia: Secondary | ICD-10-CM | POA: Diagnosis not present

## 2020-10-07 DIAGNOSIS — Z79899 Other long term (current) drug therapy: Secondary | ICD-10-CM

## 2020-10-07 DIAGNOSIS — Z8249 Family history of ischemic heart disease and other diseases of the circulatory system: Secondary | ICD-10-CM

## 2020-10-07 DIAGNOSIS — Z87442 Personal history of urinary calculi: Secondary | ICD-10-CM

## 2020-10-07 DIAGNOSIS — Z9079 Acquired absence of other genital organ(s): Secondary | ICD-10-CM

## 2020-10-07 DIAGNOSIS — Z7901 Long term (current) use of anticoagulants: Secondary | ICD-10-CM

## 2020-10-07 DIAGNOSIS — D62 Acute posthemorrhagic anemia: Secondary | ICD-10-CM | POA: Diagnosis not present

## 2020-10-07 DIAGNOSIS — R262 Difficulty in walking, not elsewhere classified: Secondary | ICD-10-CM | POA: Diagnosis not present

## 2020-10-07 LAB — COMPREHENSIVE METABOLIC PANEL
ALT: 11 U/L (ref 0–44)
AST: 15 U/L (ref 15–41)
Albumin: 2.4 g/dL — ABNORMAL LOW (ref 3.5–5.0)
Alkaline Phosphatase: 118 U/L (ref 38–126)
Anion gap: 9 (ref 5–15)
BUN: 69 mg/dL — ABNORMAL HIGH (ref 8–23)
CO2: 35 mmol/L — ABNORMAL HIGH (ref 22–32)
Calcium: 8.9 mg/dL (ref 8.9–10.3)
Chloride: 89 mmol/L — ABNORMAL LOW (ref 98–111)
Creatinine, Ser: 1.2 mg/dL (ref 0.61–1.24)
GFR, Estimated: >60 mL/min (ref >60)
Glucose, Bld: 87 mg/dL (ref 70–99)
Potassium: 4.5 mmol/L (ref 3.5–5.1)
Sodium: 133 mmol/L — ABNORMAL LOW (ref 135–145)
Total Bilirubin: 0.4 mg/dL (ref 0.3–1.2)
Total Protein: 5.8 g/dL — ABNORMAL LOW (ref 6.5–8.1)

## 2020-10-07 LAB — URINALYSIS, ROUTINE W REFLEX MICROSCOPIC
Bilirubin Urine: NEGATIVE
Glucose, UA: NEGATIVE mg/dL
Hgb urine dipstick: NEGATIVE
Ketones, ur: NEGATIVE mg/dL
Leukocytes,Ua: NEGATIVE
Nitrite: NEGATIVE
Protein, ur: NEGATIVE mg/dL
Specific Gravity, Urine: 1.016 (ref 1.005–1.030)
pH: 6 (ref 5.0–8.0)

## 2020-10-07 LAB — CBC WITH DIFFERENTIAL/PLATELET
Abs Immature Granulocytes: 0.31 10*3/uL — ABNORMAL HIGH (ref 0.00–0.07)
Basophils Absolute: 0 10*3/uL (ref 0.0–0.1)
Basophils Relative: 0 %
Eosinophils Absolute: 0 10*3/uL (ref 0.0–0.5)
Eosinophils Relative: 0 %
HCT: 26.4 % — ABNORMAL LOW (ref 39.0–52.0)
Hemoglobin: 8.1 g/dL — ABNORMAL LOW (ref 13.0–17.0)
Immature Granulocytes: 2 %
Lymphocytes Relative: 7 %
Lymphs Abs: 1 10*3/uL (ref 0.7–4.0)
MCH: 29.1 pg (ref 26.0–34.0)
MCHC: 30.7 g/dL (ref 30.0–36.0)
MCV: 95 fL (ref 80.0–100.0)
Monocytes Absolute: 1.6 10*3/uL — ABNORMAL HIGH (ref 0.1–1.0)
Monocytes Relative: 11 %
Neutro Abs: 11.1 10*3/uL — ABNORMAL HIGH (ref 1.7–7.7)
Neutrophils Relative %: 80 %
Platelets: 497 10*3/uL — ABNORMAL HIGH (ref 150–400)
RBC: 2.78 MIL/uL — ABNORMAL LOW (ref 4.22–5.81)
RDW: 13.8 % (ref 11.5–15.5)
WBC: 14 10*3/uL — ABNORMAL HIGH (ref 4.0–10.5)
nRBC: 0 % (ref 0.0–0.2)

## 2020-10-07 LAB — GLUCOSE, CAPILLARY
Glucose-Capillary: 100 mg/dL — ABNORMAL HIGH (ref 70–99)
Glucose-Capillary: 39 mg/dL — CL (ref 70–99)
Glucose-Capillary: 56 mg/dL — ABNORMAL LOW (ref 70–99)
Glucose-Capillary: 71 mg/dL (ref 70–99)
Glucose-Capillary: 72 mg/dL (ref 70–99)
Glucose-Capillary: 78 mg/dL (ref 70–99)
Glucose-Capillary: 79 mg/dL (ref 70–99)
Glucose-Capillary: 80 mg/dL (ref 70–99)
Glucose-Capillary: 85 mg/dL (ref 70–99)
Glucose-Capillary: 93 mg/dL (ref 70–99)

## 2020-10-07 MED ORDER — GERHARDT'S BUTT CREAM
TOPICAL_CREAM | Freq: Two times a day (BID) | CUTANEOUS | Status: DC
  Administered 2020-10-11: 1 via TOPICAL

## 2020-10-07 MED ORDER — OXYCODONE HCL 5 MG/5ML PO SOLN: 5.0000 mg | ORAL | Status: DC | PRN

## 2020-10-07 MED ORDER — LOPERAMIDE HCL 1 MG/7.5ML PO SUSP
4.0000 mg | Freq: Four times a day (QID) | ORAL | Status: DC
  Filled 2020-10-07 (×2): qty 30

## 2020-10-07 MED ORDER — GUAIFENESIN-DM 100-10 MG/5ML PO SYRP: 5.0000 mL | ORAL_SOLUTION | Freq: Three times a day (TID) | ORAL | Status: DC

## 2020-10-07 MED ORDER — BUDESONIDE 0.5 MG/2ML IN SUSP
0.5000 mg | Freq: Two times a day (BID) | RESPIRATORY_TRACT | Status: DC
  Administered 2020-10-07 – 2020-10-12 (×10): 0.5 mg via RESPIRATORY_TRACT
  Filled 2020-10-07 (×10): qty 2

## 2020-10-07 MED ORDER — LORAZEPAM 2 MG/ML IJ SOLN
0.5000 mg | Freq: Four times a day (QID) | INTRAMUSCULAR | Status: DC | PRN
  Administered 2020-10-07: 0.5 mg via INTRAMUSCULAR
  Filled 2020-10-07: qty 1

## 2020-10-07 MED ORDER — VANCOMYCIN HCL 1750 MG/350ML IV SOLN
1750.0000 mg | INTRAVENOUS | Status: DC
  Administered 2020-10-08 – 2020-10-10 (×3): 1750 mg via INTRAVENOUS
  Filled 2020-10-07 (×4): qty 350

## 2020-10-07 MED ORDER — PROCHLORPERAZINE 25 MG RE SUPP: 12.5000 mg | Freq: Four times a day (QID) | RECTAL | Status: DC | PRN

## 2020-10-07 MED ORDER — VANCOMYCIN HCL 2000 MG/400ML IV SOLN
2000.0000 mg | Freq: Once | INTRAVENOUS | Status: AC
  Administered 2020-10-07: 2000 mg via INTRAVENOUS
  Filled 2020-10-07: qty 400

## 2020-10-07 MED ORDER — VANCOMYCIN HCL 1750 MG/350ML IV SOLN: 1750.0000 mg | INTRAVENOUS | Status: DC

## 2020-10-07 MED ORDER — PIPERACILLIN-TAZOBACTAM 3.375 G IVPB 30 MIN
3.3750 g | Freq: Once | INTRAVENOUS | Status: DC
  Filled 2020-10-07: qty 50

## 2020-10-07 MED ORDER — CHOLESTYRAMINE 4 G PO PACK
4.0000 g | PACK | Freq: Two times a day (BID) | ORAL | Status: DC
  Filled 2020-10-07: qty 1

## 2020-10-07 MED ORDER — DEXTROSE 50 % IV SOLN: 12.5000 g | INTRAVENOUS | Status: AC

## 2020-10-07 MED ORDER — ENOXAPARIN SODIUM 100 MG/ML IJ SOSY
90.0000 mg | PREFILLED_SYRINGE | Freq: Two times a day (BID) | INTRAMUSCULAR | Status: DC
  Administered 2020-10-07 – 2020-10-08 (×2): 90 mg via SUBCUTANEOUS
  Filled 2020-10-07 (×2): qty 1

## 2020-10-07 MED ORDER — DIPHENHYDRAMINE HCL 12.5 MG/5ML PO ELIX: 12.5000 mg | ORAL_SOLUTION | Freq: Four times a day (QID) | ORAL | Status: DC | PRN

## 2020-10-07 MED ORDER — DEXTROSE-NACL 5-0.45 % IV SOLN: INTRAVENOUS | Status: DC

## 2020-10-07 MED ORDER — BUSPIRONE HCL 10 MG PO TABS: 15.0000 mg | ORAL_TABLET | Freq: Two times a day (BID) | ORAL | Status: DC

## 2020-10-07 MED ORDER — PIPERACILLIN-TAZOBACTAM 3.375 G IVPB 30 MIN
3.3750 g | Freq: Once | INTRAVENOUS | Status: AC
  Administered 2020-10-07: 3.375 g via INTRAVENOUS
  Filled 2020-10-07: qty 50

## 2020-10-07 MED ORDER — JUVEN PO PACK: 1.0000 | PACK | Freq: Two times a day (BID) | ORAL | Status: DC

## 2020-10-07 MED ORDER — GLUCAGON HCL RDNA (DIAGNOSTIC) 1 MG IJ SOLR
INTRAMUSCULAR | Status: AC
  Administered 2020-10-07: 1 mg
  Filled 2020-10-07: qty 1

## 2020-10-07 MED ORDER — PROCHLORPERAZINE MALEATE 5 MG PO TABS: 5.0000 mg | ORAL_TABLET | Freq: Four times a day (QID) | ORAL | Status: DC | PRN

## 2020-10-07 MED ORDER — VANCOMYCIN HCL 2000 MG/400ML IV SOLN
2000.0000 mg | Freq: Once | INTRAVENOUS | Status: DC
  Filled 2020-10-07: qty 400

## 2020-10-07 MED ORDER — GLUCAGON HCL RDNA (DIAGNOSTIC) 1 MG IJ SOLR: 1.0000 mg | INTRAMUSCULAR | Status: AC

## 2020-10-07 MED ORDER — APIXABAN 5 MG PO TABS: 5.0000 mg | ORAL_TABLET | Freq: Two times a day (BID) | ORAL | Status: DC

## 2020-10-07 MED ORDER — BISACODYL 10 MG RE SUPP: 10.0000 mg | Freq: Every day | RECTAL | Status: DC | PRN

## 2020-10-07 MED ORDER — PROSOURCE TF PO LIQD: 45.0000 mL | Freq: Every day | ORAL | Status: DC

## 2020-10-07 MED ORDER — REVEFENACIN 175 MCG/3ML IN SOLN
175.0000 ug | Freq: Every day | RESPIRATORY_TRACT | Status: DC
  Administered 2020-10-08: 175 ug via RESPIRATORY_TRACT
  Filled 2020-10-07: qty 3

## 2020-10-07 MED ORDER — SODIUM CHLORIDE 0.9 % IV SOLN
250.0000 mg | Freq: Every day | INTRAVENOUS | Status: AC
  Administered 2020-10-08 – 2020-10-09 (×2): 250 mg via INTRAVENOUS
  Filled 2020-10-07 (×2): qty 20

## 2020-10-07 MED ORDER — BETHANECHOL CHLORIDE 10 MG PO TABS
10.0000 mg | ORAL_TABLET | Freq: Four times a day (QID) | ORAL | Status: DC
  Filled 2020-10-07 (×3): qty 1

## 2020-10-07 MED ORDER — GLUCAGON HCL RDNA (DIAGNOSTIC) 1 MG IJ SOLR
INTRAMUSCULAR | Status: AC
  Administered 2020-10-07: 1 mg via INTRAMUSCULAR
  Filled 2020-10-07: qty 1

## 2020-10-07 MED ORDER — NUTRISOURCE FIBER PO PACK
1.0000 | PACK | Freq: Two times a day (BID) | ORAL | Status: DC
  Filled 2020-10-07: qty 1

## 2020-10-07 MED ORDER — LOPERAMIDE HCL 1 MG/7.5ML PO SUSP
2.0000 mg | Freq: Two times a day (BID) | ORAL | Status: DC | PRN
  Filled 2020-10-07: qty 15

## 2020-10-07 MED ORDER — PROCHLORPERAZINE EDISYLATE 10 MG/2ML IJ SOLN: 5.0000 mg | Freq: Four times a day (QID) | INTRAMUSCULAR | Status: DC | PRN

## 2020-10-07 MED ORDER — ONDANSETRON HCL 4 MG/2ML IJ SOLN: 4.0000 mg | Freq: Four times a day (QID) | INTRAMUSCULAR | Status: DC | PRN

## 2020-10-07 MED ORDER — ACETYLCYSTEINE 20 % IN SOLN
600.0000 mg | Freq: Two times a day (BID) | RESPIRATORY_TRACT | Status: DC
  Administered 2020-10-07 – 2020-10-08 (×2): 600 mg via RESPIRATORY_TRACT
  Filled 2020-10-07 (×2): qty 4

## 2020-10-07 MED ORDER — MILK AND MOLASSES ENEMA
1.0000 | Freq: Every day | RECTAL | Status: DC | PRN
  Filled 2020-10-07: qty 240

## 2020-10-07 MED ORDER — LEVOTHYROXINE SODIUM 25 MCG PO TABS: 125.0000 ug | ORAL_TABLET | Freq: Every day | ORAL | Status: DC

## 2020-10-07 MED ORDER — RISAQUAD PO CAPS
2.0000 | ORAL_CAPSULE | Freq: Three times a day (TID) | ORAL | Status: DC
  Administered 2020-10-08 – 2020-10-10 (×7): 2 via ORAL
  Filled 2020-10-07 (×7): qty 2

## 2020-10-07 MED ORDER — GUAIFENESIN-DM 100-10 MG/5ML PO SYRP: 5.0000 mL | ORAL_SOLUTION | Freq: Four times a day (QID) | ORAL | Status: DC | PRN

## 2020-10-07 MED ORDER — HEPARIN BOLUS VIA INFUSION
2000.0000 [IU] | Freq: Once | INTRAVENOUS | Status: DC
  Filled 2020-10-07: qty 2000

## 2020-10-07 MED ORDER — PIPERACILLIN-TAZOBACTAM 3.375 G IVPB
3.3750 g | Freq: Three times a day (TID) | INTRAVENOUS | Status: DC
  Filled 2020-10-07: qty 50

## 2020-10-07 MED ORDER — QUETIAPINE FUMARATE 25 MG PO TABS: 25.0000 mg | ORAL_TABLET | Freq: Two times a day (BID) | ORAL | Status: DC | PRN

## 2020-10-07 MED ORDER — ORAL CARE MOUTH RINSE
15.0000 mL | OROMUCOSAL | Status: DC
  Administered 2020-10-07 – 2020-10-12 (×45): 15 mL via OROMUCOSAL

## 2020-10-07 MED ORDER — NEPRO/CARBSTEADY PO LIQD
1000.0000 mL | ORAL | Status: DC
  Filled 2020-10-07 (×2): qty 1000

## 2020-10-07 MED ORDER — DEXTROSE 50 % IV SOLN
12.5000 g | Freq: Once | INTRAVENOUS | Status: AC
  Administered 2020-10-07: 12.5 g via INTRAVENOUS
  Filled 2020-10-07: qty 50

## 2020-10-07 MED ORDER — SENNOSIDES-DOCUSATE SODIUM 8.6-50 MG PO TABS: 1.0000 | ORAL_TABLET | Freq: Every evening | ORAL | Status: DC | PRN

## 2020-10-07 MED ORDER — PIPERACILLIN-TAZOBACTAM 3.375 G IVPB
3.3750 g | Freq: Three times a day (TID) | INTRAVENOUS | Status: DC
  Administered 2020-10-08 – 2020-10-12 (×14): 3.375 g via INTRAVENOUS
  Filled 2020-10-07 (×14): qty 50

## 2020-10-07 MED ORDER — LIDOCAINE HCL URETHRAL/MUCOSAL 2 % EX GEL
CUTANEOUS | Status: DC | PRN
  Filled 2020-10-07: qty 11

## 2020-10-07 MED ORDER — TRAZODONE HCL 50 MG PO TABS: 25.0000 mg | ORAL_TABLET | Freq: Every evening | ORAL | Status: DC | PRN

## 2020-10-07 MED ORDER — DILTIAZEM 12 MG/ML ORAL SUSPENSION
30.0000 mg | Freq: Four times a day (QID) | ORAL | Status: DC
  Filled 2020-10-07 (×3): qty 3

## 2020-10-07 MED ORDER — CHLORHEXIDINE GLUCONATE 0.12 % MT SOLN
15.0000 mL | Freq: Two times a day (BID) | OROMUCOSAL | Status: DC
  Administered 2020-10-07 – 2020-10-12 (×10): 15 mL via OROMUCOSAL
  Filled 2020-10-07 (×9): qty 15

## 2020-10-07 MED ORDER — LORAZEPAM 2 MG/ML IJ SOLN: 0.5000 mg | Freq: Four times a day (QID) | INTRAMUSCULAR | Status: DC | PRN

## 2020-10-07 MED ORDER — ACETAMINOPHEN 160 MG/5ML PO SOLN
500.0000 mg | Freq: Four times a day (QID) | ORAL | Status: DC
  Administered 2020-10-08 – 2020-10-12 (×15): 500 mg
  Filled 2020-10-07 (×16): qty 20.3

## 2020-10-07 MED ORDER — ALUMINUM HYDROXIDE GEL 320 MG/5ML PO SUSP
10.0000 mL | Freq: Four times a day (QID) | ORAL | Status: DC | PRN
  Filled 2020-10-07: qty 30

## 2020-10-07 MED ORDER — ALBUTEROL SULFATE (2.5 MG/3ML) 0.083% IN NEBU
2.5000 mg | INHALATION_SOLUTION | RESPIRATORY_TRACT | Status: DC | PRN
  Administered 2020-10-08: 2.5 mg via RESPIRATORY_TRACT
  Filled 2020-10-07: qty 3

## 2020-10-07 MED ORDER — FREE WATER: 100.0000 mL | Freq: Three times a day (TID) | Status: DC

## 2020-10-07 MED ORDER — ATENOLOL 25 MG PO TABS: 25.0000 mg | ORAL_TABLET | Freq: Two times a day (BID) | ORAL | Status: DC

## 2020-10-07 MED ORDER — HEPARIN (PORCINE) 25000 UT/250ML-% IV SOLN: 1150.0000 [IU]/h | INTRAVENOUS | Status: DC

## 2020-10-07 NOTE — Progress Notes (Signed)
Hypoglycemic Event  CBG: 39  Treatment: Glucagon 90m IM ordered, given to R deltoid per provider order.   Symptoms: Agitation and confusion noted. Patient still alert with no changes in mental status from baseline.   Follow-up CBG: Time: 1339 CBG Result:78  Possible Reasons for Event: Feeding tube held due to patient having pulled cortrak early this AM. NG in place, currently waiting on KUB results to check for placement.   Comments/MD notified:MD aware, new orders obtained and carried out. Will continue to monitor.    CCathey Endow

## 2020-10-07 NOTE — Progress Notes (Signed)
Hypoglycemic Event  CBG: 56  Treatment: Glucagon 69m IM to L deltoid per provider's orders at 8:35.   Symptoms: No acute hypoglycemic symptoms noted. Patient noted alert and oriented x2-3, with no changes from baseline.   Follow-up CBG: [id number] Result:72, MD aware  Possible Reasons for Event: Patient pulled Cor-trak this AM. Continuous feeding held until NG placed.   Comments/MD notified: Physician notified, new orders obtained and carried out.     CCathey Endow

## 2020-10-07 NOTE — Progress Notes (Signed)
Pharmacy Antibiotic Note  Devin [name] is a 80 y.o. male admitted on 10/06/2020 with concern on pneumonia.  Pharmacy has been consulted for vancomycin and zosyn dosing. -WBC= 14, afebrile -SCr=1.2 , CrCl ~ 60 -recent trach cultures (4/11) with klebsiella Pn   Plan: -Zosyn 3.375gm IV q8h -Vancomycin 2066m IV x1 followed by 1750 mg IV q24h (estimated AUC= 443) -Will follow renal function, cultures and clinical progress   Weight: 93.3 kg (205 lb 11 oz)  Temp (24hrs), Avg:98.3 F (36.8 C), Min:97.9 F (36.6 C), Max:98.9 F (37.2 C)  Recent Labs  Lab 10/02/20 0748 10/03/20 0154 10/05/20 0236 10/05/20 0755 10/05/20 1200 10/06/20 0151 10/07/20 0526  WBC 12.3* 13.0* 15.8*  --   --  16.8* 14.0*  CREATININE 1.13 1.20 1.34* 1.44* 1.49* 1.47* 1.20    Estimated Creatinine Clearance: 58.1 mL/min (by C-G formula based on SCr of 1.2 mg/dL).    No Known Allergies  Thank you for allowing pharmacy to be a part of this patient's care.  AHildred Laser PharmD Clinical Pharmacist **Pharmacist phone directory can now be found on [email or web address] ([email or web address]).  [place]ed under [email or web address]

## 2020-10-07 NOTE — Progress Notes (Addendum)
ANTICOAGULATION CONSULT NOTE - Initial Consult  Pharmacy Consult for lovenox Indication: atrial fibrillation  No Known Allergies  Patient Measurements:  Vital Signs: Temp: 97.7 F (36.5 C) (04/30 1715) Temp Source: Axillary (04/30 1715) BP: 131/99 (04/30 1715) Pulse Rate: 89 (04/30 1715)  Labs: Recent Labs    10/05/20 0236 10/05/20 0755 10/05/20 1200 10/06/20 0151 10/07/20 0526  HGB 8.2*  --   --  7.7* 8.1*  HCT 26.5*  --   --  25.6* 26.4*  PLT 499*  --   --  554* 497*  CREATININE 1.34*   < > 1.49* 1.47* 1.20   < > = values in this interval not displayed.    Estimated Creatinine Clearance: 58.1 mL/min (by C-G formula based on SCr of 1.2 mg/dL).   Medical History: Past Medical History:  Diagnosis Date  . A-fib ([place])   . 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 Ankylosing spondylitis ([place])    Diagnosed during lumbar fracture summer of 2016    . Crohn's disease ([place])   . Gout   . HIstory of basal cell cancer of face    THYROID CA HX  . History of kidney stones   . Hypertension   . Hypothyroidism   . Impotence   . 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 Obesity (BMI 30-39.9)   . Osteoporosis    Pt completed 5 years of fosamax in 2013     . Prostate cancer with recurrence    Treated with prostatectomy with recurrence 2012 with Lupron treatment now him   . Psoriasis   . Thyroid cancer ([place]) 1999   Treated with RAI and total thyroidectomy   . Type II diabetes mellitus ([facility])       Medications:  Medications Prior to Admission  Medication Sig Dispense Refill Last Dose  . acetaminophen (TYLENOL) 160 MG/5ML solution Place 15.6 mLs (500 mg total) into feeding tube every 6 (six) hours. 120 mL 0   . acetylcysteine (MUCOMYST) 20 % nebulizer solution Take 3 mLs (600 mg total) by nebulization 2 (two) times daily. 30 mL 12   . albuterol (VENTOLIN HFA) 108 (90 Base) MCG/ACT inhaler Inhale 2 puffs into the lungs every 6 (six) hours  as needed for wheezing or shortness of breath. 8 g 6   . ALPRAZolam (XANAX) 0.5 MG tablet Take 0.5 mg by mouth 2 (two) times daily as needed for anxiety.     Marland Kitchen apixaban (ELIQUIS) 5 MG TABS tablet Take 5 mg by mouth 2 (two) times daily.     Marland Kitchen atenolol (TENORMIN) 25 MG tablet Take 1 tablet (25 mg total) by mouth 2 (two) times daily.     . B-D ULTRAFINE III SHORT PEN 31G X 8 MM MISC USE 1 PEN NEEDLE FOUR TIMES DAILY. 100 each 0   . bethanechol (URECHOLINE) 5 MG tablet 1 tablet (5 mg total) by Per NG tube route 4 (four) times daily.     . budesonide (PULMICORT) 0.5 MG/2ML nebulizer solution Take 2 mLs (0.5 mg total) by nebulization 2 (two) times daily.  12   . busPIRone (BUSPAR) 15 MG tablet Take 15 mg by mouth 2 (two) times daily.      . calcium-vitamin D (OSCAL WITH D) 500-200 MG-UNIT tablet Take 2 tablets by mouth daily.      . cholestyramine (QUESTRAN) 4 g packet Place 1 packet (4 g total) into feeding tube 2 (two) times daily before lunch and supper. 60 each  12   . diltiazem (CARDIZEM) 10 mg/ml oral suspension Place 3 mLs (30 mg total) into feeding tube every 6 (six) hours.     . diphenhydrAMINE (BENADRYL) 25 mg capsule 1 capsule (25 mg total) by Per NG tube route at bedtime as needed for sleep. 30 capsule 0   . ferric gluconate 250 mg in sodium chloride 0.9 % 100 mL Inject 250 mg into the vein daily.     . fiber (NUTRISOURCE FIBER) PACK packet Place 1 packet into feeding tube 2 (two) times daily.     . Glucose Blood (ASCENSIA CONTOUR TEST VI) Use as directed to test two times a day     . glucose blood (BAYER CONTOUR TEST) test strip TEST BLOOD SUGAR TWICE DAILY AS DIRECTED. 200 each 2   . glucose blood test strip 1 each by Other route 2 times daily.     Marland Kitchen guaiFENesin-dextromethorphan (ROBITUSSIN DM) 100-10 MG/5ML syrup Place 5 mLs into feeding tube every 8 (eight) hours. 118 mL 0   . HYDROcodone-acetaminophen (NORCO/VICODIN) 5-325 MG tablet Take 1 tablet by mouth every 6 (six) hours as needed  (pain).     . insulin aspart (NOVOLOG) 100 UNIT/ML injection Inject 0-20 Units into the skin every 4 (four) hours. 10 mL 11   . insulin glargine (LANTUS) 100 UNIT/ML injection Inject 0.12 mLs (12 Units total) into the skin 2 (two) times daily. 10 mL 11   . leuprolide (LUPRON) 11.25 MG injection Inject 11.25 mg into the muscle every 6 (six) months.      . levothyroxine (SYNTHROID) 125 MCG tablet Take 1 tablet (125 mcg total) by mouth daily before breakfast. 30 tablet 0   . loperamide HCl (IMODIUM) 1 MG/7.5ML suspension Place 15 mLs (2 mg total) into feeding tube 2 (two) times daily as needed for diarrhea or loose stools. 120 mL 0   . loperamide HCl (IMODIUM) 1 MG/7.5ML suspension Place 30 mLs (4 mg total) into feeding tube 4 (four) times daily. 120 mL 0   . Mesalamine 800 MG TBEC Take 1,600 mg by mouth 2 (two) times daily.     . methocarbamol (ROBAXIN) 500 MG tablet Take 500 mg by mouth every 8 (eight) hours.     . Multiple Vitamins-Iron (DAILY-VITE/IRON/BETA-CAROTENE) TABS Take 1 tablet by mouth daily.      . nutrition supplement, JUVEN, (JUVEN) PACK Place 1 packet into feeding tube 2 (two) times daily between meals.  0   . Nutritional Supplements (FEEDING SUPPLEMENT, NEPRO CARB STEADY,) LIQD Place 1,000 mLs into feeding tube continuous.  0   . Nutritional Supplements (FEEDING SUPPLEMENT, PROSOURCE TF,) liquid Place 45 mLs into feeding tube daily.     Marland Kitchen Nystatin (GERHARDT'S BUTT CREAM) CREA Apply 1 application topically 2 (two) times daily.     . ondansetron (ZOFRAN) 4 MG/2ML SOLN injection Inject 2 mLs (4 mg total) into the vein every 6 (six) hours as needed for nausea or vomiting. 2 mL 0   . oxyCODONE (ROXICODONE) 5 MG/5ML solution Place 5 mLs (5 mg total) into feeding tube every 4 (four) hours as needed for moderate pain.  0   . phenol (CHLORASEPTIC) 1.4 % LIQD Use as directed 1 spray in the mouth or throat as needed for throat irritation / pain.  0   . revefenacin (YUPELRI) 175 MCG/3ML nebulizer  solution Take 3 mLs (175 mcg total) by nebulization daily. 90 mL    . Tiotropium Bromide Monohydrate (SPIRIVA RESPIMAT) 2.5 MCG/ACT AERS Inhale 2 puffs into the  lungs daily. 4 g 0   . Water For Irrigation, Sterile (FREE WATER) SOLN Place 100 mLs into feeding tube every 8 (eight) hours.     Marland Kitchen zinc gluconate 50 MG tablet Take 50 mg by mouth daily.       Assessment: 80 yo male with afib on apixaban but unable to take po medications. Pharmacy consulted to dose lovenox (last dose of apixaban was on 10/01/20) -hg= 8.1 (low/stable) -SCr= 1.2, CrCl ~ 60   Goal of Therapy:  Heparin level 0.3-0.7 units/ml Monitor platelets by anticoagulation protocol: Yes   Plan:  -lovenox 66m [place] q12h -CBC every 3 days  AHildred Laser PharmD Clinical Pharmacist **Pharmacist phone directory can now be found on [email or web address] ([email or web address]).  Listed under [facility]

## 2020-10-07 NOTE — Progress Notes (Signed)
Physical Therapy Session Note  Patient Details  Name: [name] MRN: [id number] Date of Birth: [birth year]  Today's Date: 10/07/2020          Therapy Documentation Precautions:  Restrictions Weight Bearing Restrictions: No General: PT Amount of Missed Time (min): 60 Minutes PT Missed Treatment Reason: MD hold (Comment) (Dr. Adam Phenix put pt on hold due to hypglycemia and pt having pulled Cor-Trak.)    Therapy/Group: Individual Therapy  Madi Bonfiglio 10/07/2020, 11:11 AM

## 2020-10-07 NOTE — Progress Notes (Signed)
Patient pulled out Cortak DR. Reulkar notified. MD suggested to hold the am meds  until cortak is inserted. [facility] cortak team to be contacted by day shift RN at 8 am when they are available. MD ordered for cortak insertion.

## 2020-10-07 NOTE — H&P (Addendum)
History and Physical    Devin [name] PVX:[id number] DOB: [birth year] DOA: 10/07/2020  Referring MD/NP/PA: Reesa Chew, PA-C PCP: Prince Solian, MD  Patient coming from: Transfer from rehab  Chief Complaint: Hypoglycemia  I have personally briefly reviewed patient's old medical records in [place]   HPI: Devin [name] is a 80 y.o. male with medical history significant of hypertension, A. 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    Patient had just recently been hospitalized from 4/3-4/29 after presenting in excruciating pain shortness of breath after recently being found to have T11 Chance fracture on 4/1 that likely resulted from fall that occurred on 3/31.  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  Neurosurgery had been consulted and ultimately patient underwent posterior fusion of T11-T12 on 4/8.  Patient was not able to tolerate extubation despite trying to optimize his management.  Tracheostomy was placed on 4/12.  GI have been consulted due to the issues with diarrhea and given patient with prior history of Crohn's disease and ultimately required rectal tube placement.  Hospital course was complicated by A. fib with RVR, Klebsiella pneumonia treated with Rocephin, fluid overload, and a soft tissue mass at the level of the supraglottic larynx which ENT has been consulted and patient went biopsy on 4/27.  Patient subsequently developed hyperkalemia 4/28 for which nephrology was consulted and patient was placed on Kayexalate, lokelma, IV Lasix, insulin and sodium bicarb.  Change to the patient's tube feeds have been made.  Patient was transferred to CIR due to his overall functional decline on  4/29.  Patient had reportedly dislodged his cortrak this morning and attempts hadbeen made to replace it with a NG tube.  X-rays revealed that the core track was likely in the right lobe of the lung and was subsequently removed.  Blood sugars dropped down as low as 39.  Patient was given glucagon 1 mg IM x2 doses and subsequently started on D5 quarter normal saline at 50 mL/h.  TRH called to transfer patient due to progressive decline in no way of patient receiving his home oral medications.  Labs today significant for WBC 14, hemoglobin 8.1, platelet count was 497, sodium 133, chloride 89, BUN 69, creatinine 1.2, and albumin 2.4.  Urinalysis showed no signs of infection.  Chest x-ray   ED Course: As seen above.  Review of Systems  Unable to perform ROS: Medical condition    Past Medical History:  Diagnosis Date  . A-fib ([place])   . Adenocarcinoma of left lung, stage 1 ([place]) 03/10/2015  . Ankylosing spondylitis ([place])    Diagnosed during lumbar fracture summer of 2016    . Crohn's disease ([place])   . Gout   . HIstory of basal cell cancer of face    THYROID CA HX  . History of kidney stones   . Hypertension   . Hypothyroidism   . Impotence   . 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 Obesity (BMI 30-39.9)   . Osteoporosis    Pt completed 5 years of fosamax in 2013     . Prostate cancer with recurrence    Treated with prostatectomy with recurrence 2012 with Lupron treatment now him   . Psoriasis   . Thyroid cancer ([place]) 1999  Treated with RAI and total thyroidectomy   . Type II diabetes mellitus ([place])     Past Surgical History:  Procedure Laterality Date  . ABDOMINAL EXPLORATION SURGERY     for small bowel obstruction  . APPENDECTOMY  03/2007  . BACK SURGERY    . BASAL CELL CARCINOMA EXCISION  "several"   "head"  . CARDIAC CATHETERIZATION  03/17/2003  . CHOLECYSTECTOMY N/A 05/07/2016   Procedure: LAPAROSCOPIC CHOLECYSTECTOMY;  Surgeon: Fanny Skates,  MD;  Location: [facility];  Service: General;  Laterality: N/A;  . COLON SURGERY  03/2007   Resection of cecum, appendix, terminal ileum (approximately/notes 10/10/2010  . CYSTOSCOPY/URETEROSCOPY/HOLMIUM LASER/STENT PLACEMENT Right 07/03/2020   Procedure: CYSTOSCOPY/RETROGRADE/URETEROSCOPY REMOVAL OF BLADDER STONE;  Surgeon: Raynelle Bring, MD;  Location: [facility];  Service: Urology;  Laterality: Right;  . DIRECT LARYNGOSCOPY N/A 10/04/2020   Procedure: DIRECT LARYNGOSCOPY, PHARYNGOSCOPY WITH BIOPSY;  Surgeon: [name], DO;  Location: [place] [facility];  Service: ENT;  Laterality: N/A;  . ESOPHAGOSCOPY N/A 10/04/2020   Procedure: ESOPHAGOSCOPY WITH BIOPSY;  Surgeon: [name], DO;  Location: [facility];  Service: ENT;  Laterality: N/A;  . HERNIA REPAIR    . LAMINECTOMY WITH POSTERIOR LATERAL ARTHRODESIS LEVEL 1 N/A 09/15/2020   Procedure: REVISION OF THORACOLUMBAR FUSION, ADDITION OF CROSS-CONNECTORS;  Surgeon: Eustace Moore, MD;  Location: [facility];  Service: Neurosurgery;  Laterality: N/A;  . LAPAROSCOPIC CHOLECYSTECTOMY  05/07/2016  . LAPAROSCOPIC LYSIS OF ADHESIONS  05/07/2016  . LAPAROSCOPIC LYSIS OF ADHESIONS N/A 05/07/2016   Procedure: LAPAROSCOPIC LYSIS OF ADHESIONS TIMES ONE HOUR;  Surgeon: Fanny Skates, MD;  Location: [facility];  Service: General;  Laterality: N/A;  . POSTERIOR FUSION THORACIC SPINE  02/08/2016   1. Posterior thoracic arthrodesis T7-T11 utilizing morcellized allograft, 2. Posterior thoracic segmental fixation T7-T11 utilizing nuvasive pedicle screws  . PROSTATECTOMY  06/2001   w/bilateral pelvic lymph nose dissection/notes 10/24/2010  . SPINAL FUSION  12/2014   Open reduction internal fixation of L1 Chance fracture with posterior fusion T10-L4 utilizing morcellized allograft and some local autograft, segmental instrumentation T10-L4 inclusive utilizing nuvasive pedicle screws/notes 12/16/2014  . Stress Cardiolite  02/17/2003  . THOROCOTOMY WITH LOBECTOMY  03/16/2015   Procedure:  THOROCOTOMY WITH LOBECTOMY;  Surgeon: Ivin Poot, MD;  Location: [facility];  Service: Thoracic;;  . TONSILLECTOMY    . TOTAL THYROIDECTOMY  1997  . Venous Doppler  05/30/2004  . VENTRAL HERNIA REPAIR  04/14/2008  . VIDEO ASSISTED THORACOSCOPY Left 03/16/2015   Procedure: VIDEO ASSISTED THORACOSCOPY;  Surgeon: Ivin Poot, MD;  Location: [facility];  Service: Thoracic;  Laterality: Left;     reports that he quit smoking about 30 years ago. His smoking use included cigarettes. He has a 60.00 pack-year smoking history. He has never used smokeless tobacco. He reports previous alcohol use. He reports that he does not use drugs.  No Known Allergies  Family History  Problem Relation Age of Onset  . CAD Mother   . Cancer Neg Hx        No cancer in the patient's immediate family, except of course for the patient himself, as noted    Prior to Admission medications   Medication Sig Start Date End Date Taking? Authorizing Provider  acetaminophen (TYLENOL) 160 MG/5ML solution Place 15.6 mLs (500 mg total) into feeding tube every 6 (six) hours. 10/06/20   Terrilee Croak, MD  acetylcysteine (MUCOMYST) 20 % nebulizer solution Take 3 mLs (600 mg total) by nebulization 2 (two) times daily.  10/06/20   Terrilee Croak, MD  albuterol (VENTOLIN HFA) 108 (90 Base) MCG/ACT inhaler Inhale 2 puffs into the lungs every 6 (six) hours as needed for wheezing or shortness of breath. 08/31/20   Brand Males, MD  ALPRAZolam Duanne Moron) 0.5 MG tablet Take 0.5 mg by mouth 2 (two) times daily as needed for anxiety. 02/29/20   [provider]  apixaban (ELIQUIS) 5 MG TABS tablet Take 5 mg by mouth 2 (two) times daily.    [provider]  atenolol (TENORMIN) 25 MG tablet Take 1 tablet (25 mg total) by mouth 2 (two) times daily. 02/09/20   Thurnell Lose, MD  B-D ULTRAFINE III SHORT PEN 31G X 8 MM MISC USE 1 PEN NEEDLE FOUR TIMES DAILY. 09/18/16   Ivar Drape D, PA  bethanechol (URECHOLINE) 5 MG tablet 1  tablet (5 mg total) by Per NG tube route 4 (four) times daily. 10/06/20   Dahal, Marlowe Aschoff, MD  budesonide (PULMICORT) 0.5 MG/2ML nebulizer solution Take 2 mLs (0.5 mg total) by nebulization 2 (two) times daily. 10/06/20   Dahal, Marlowe Aschoff, MD  busPIRone (BUSPAR) 15 MG tablet Take 15 mg by mouth 2 (two) times daily.  05/17/16   [provider]  calcium-vitamin D (OSCAL WITH D) 500-200 MG-UNIT tablet Take 2 tablets by mouth daily.     [provider]  cholestyramine (QUESTRAN) 4 g packet Place 1 packet (4 g total) into feeding tube 2 (two) times daily before lunch and supper. 10/06/20   Terrilee Croak, MD  diltiazem (CARDIZEM) 10 mg/ml oral suspension Place 3 mLs (30 mg total) into feeding tube every 6 (six) hours. 10/06/20   Terrilee Croak, MD  diphenhydrAMINE (BENADRYL) 25 mg capsule 1 capsule (25 mg total) by Per NG tube route at bedtime as needed for sleep. 10/06/20   Terrilee Croak, MD  ferric gluconate 250 mg in sodium chloride 0.9 % 100 mL Inject 250 mg into the vein daily. 10/06/20   Dahal, Marlowe Aschoff, MD  fiber (NUTRISOURCE FIBER) PACK packet Place 1 packet into feeding tube 2 (two) times daily. 10/06/20   Terrilee Croak, MD  Glucose Blood (ASCENSIA CONTOUR TEST VI) Use as directed to test two times a day    [provider]  glucose blood (BAYER CONTOUR TEST) test strip TEST BLOOD SUGAR TWICE DAILY AS DIRECTED. 05/17/16   Gale Journey, Sarah L, PA-C  glucose blood test strip 1 each by Other route 2 times daily. 05/17/16   [provider]  guaiFENesin-dextromethorphan (ROBITUSSIN DM) 100-10 MG/5ML syrup Place 5 mLs into feeding tube every 8 (eight) hours. 10/06/20   Terrilee Croak, MD  HYDROcodone-acetaminophen (NORCO/VICODIN) 5-325 MG tablet Take 1 tablet by mouth every 6 (six) hours as needed (pain). 09/08/20   [provider]  insulin aspart (NOVOLOG) 100 UNIT/ML injection Inject 0-20 Units into the skin every 4 (four) hours. 10/06/20   Dahal, Marlowe Aschoff, MD  insulin glargine (LANTUS)  100 UNIT/ML injection Inject 0.12 mLs (12 Units total) into the skin 2 (two) times daily. 10/06/20   Dahal, Marlowe Aschoff, MD  leuprolide (LUPRON) 11.25 MG injection Inject 11.25 mg into the muscle every 6 (six) months.     [provider]  levothyroxine (SYNTHROID) 125 MCG tablet Take 1 tablet (125 mcg total) by mouth daily before breakfast. 02/10/20   Thurnell Lose, MD  loperamide HCl (IMODIUM) 1 MG/7.5ML suspension Place 15 mLs (2 mg total) into feeding tube 2 (two) times daily as needed for diarrhea or loose stools. 10/06/20   Dahal,  Marlowe Aschoff, MD  loperamide HCl (IMODIUM) 1 MG/7.5ML suspension Place 30 mLs (4 mg total) into feeding tube 4 (four) times daily. 10/06/20   Terrilee Croak, MD  Mesalamine 800 MG TBEC Take 1,600 mg by mouth 2 (two) times daily.    [provider]  methocarbamol (ROBAXIN) 500 MG tablet Take 500 mg by mouth every 8 (eight) hours. 09/08/20   [provider]  Multiple Vitamins-Iron (DAILY-VITE/IRON/BETA-CAROTENE) TABS Take 1 tablet by mouth daily.     [provider]  nutrition supplement, JUVEN, (JUVEN) PACK Place 1 packet into feeding tube 2 (two) times daily between meals. 10/06/20   Terrilee Croak, MD  Nutritional Supplements (FEEDING SUPPLEMENT, NEPRO CARB STEADY,) LIQD Place 1,000 mLs into feeding tube continuous. 10/06/20   Terrilee Croak, MD  Nutritional Supplements (FEEDING SUPPLEMENT, PROSOURCE TF,) liquid Place 45 mLs into feeding tube daily. 10/07/20   Terrilee Croak, MD  Nystatin (GERHARDT'S BUTT CREAM) CREA Apply 1 application topically 2 (two) times daily. 10/06/20   Terrilee Croak, MD  ondansetron (ZOFRAN) 4 MG/2ML SOLN injection Inject 2 mLs (4 mg total) into the vein every 6 (six) hours as needed for nausea or vomiting. 10/06/20   Dahal, Marlowe Aschoff, MD  oxyCODONE (ROXICODONE) 5 MG/5ML solution Place 5 mLs (5 mg total) into feeding tube every 4 (four) hours as needed for moderate pain. 10/06/20   Dahal, Marlowe Aschoff, MD  phenol (CHLORASEPTIC) 1.4 % LIQD  Use as directed 1 spray in the mouth or throat as needed for throat irritation / pain. 10/06/20   Dahal, Marlowe Aschoff, MD  revefenacin (YUPELRI) 175 MCG/3ML nebulizer solution Take 3 mLs (175 mcg total) by nebulization daily. 10/07/20   Terrilee Croak, MD  Tiotropium Bromide Monohydrate (SPIRIVA RESPIMAT) 2.5 MCG/ACT AERS Inhale 2 puffs into the lungs daily. 08/31/20   Brand Males, MD  Water For Irrigation, Sterile (FREE WATER) SOLN Place 100 mLs into feeding tube every 8 (eight) hours. 10/06/20   Terrilee Croak, MD  zinc gluconate 50 MG tablet Take 50 mg by mouth daily.    [provider]    Physical Exam:  Constitutional: Patient appears to be resting at this time, but we will open his eyes and respond to you calling his name Vitals:   10/07/20 1715  BP: (!) 131/99  Pulse: 89  Temp: 97.7 F (36.5 C)  TempSrc: Axillary  SpO2: 95%   Eyes: PERRL, lids and conjunctivae normal ENMT: Mucous membranes are dry. Posterior pharynx clear of any exudate or lesions.  Attempted to place nasogastric tube 2 times after 2 failed attempts by nursing, but tube kept coiling up into the patient's mouth due to his trach Neck: Tracheostomy tube present patient currently on 10 L nasal cannula oxygen. Respiratory: Decreased overall aeration with normal effort.  Some rales appreciated in lower lung fields.   Cardiovascular: Regular rate and rhythm, no murmurs / rubs / gallops.  2+ pedal edema. 2+ pedal pulses. No carotid bruits.  Abdomen: no tenderness, no masses palpated. No hepatosplenomegaly. Bowel sounds positive.  Musculoskeletal: no clubbing / cyanosis. No joint deformity upper and lower extremities. Good ROM, no contractures. Normal muscle tone.  Skin: no rashes, lesions, ulcers. No induration Neurologic: CN 2-12 grossly intact. Sensation intact, DTR normal. Strength 5/5 in all 4.  Psychiatric: Normal judgment and insight. Alert and oriented x 3. Normal mood.     Labs on Admission: I have  personally reviewed following labs and imaging studies  CBC: Recent Labs  Lab 10/02/20 0748 10/03/20 0154 10/05/20 0236 10/06/20 0151  10/07/20 0526  WBC 12.3* 13.0* 15.8* 16.8* 14.0*  NEUTROABS 10.0* 10.8* 15.0* 13.7* 11.1*  HGB 7.7* 7.8* 8.2* 7.7* 8.1*  HCT 24.9* 24.9* 26.5* 25.6* 26.4*  MCV 94.3 94.0 95.3 98.5 95.0  PLT 378 400 499* 554* 662*   Basic Metabolic Panel: Recent Labs  Lab 10/03/20 1511 10/05/20 0236 10/05/20 0755 10/05/20 1200 10/05/20 1646 10/06/20 0151 10/07/20 0526  NA  --  131* 130* 133*  --  134* 133*  K  --  6.2* 7.0* 6.3* 5.4* 5.1 4.5  CL  --  92* 91* 89*  --  89* 89*  CO2  --  31 32 36*  --  38* 35*  GLUCOSE  --  235* 360* 269*  --  116* 87  BUN  --  56* 60* 66*  --  77* 69*  CREATININE  --  1.34* 1.44* 1.49*  --  1.47* 1.20  CALCIUM  --  8.5* 8.5* 8.5*  --  8.9 8.9  MG 2.1  --   --   --   --   --   --   PHOS 3.7  --   --   --   --   --   --    GFR: Estimated Creatinine Clearance: 58.1 mL/min (by C-G formula based on SCr of 1.2 mg/dL). Liver Function Tests: Recent Labs  Lab 10/07/20 0526  AST 15  ALT 11  ALKPHOS 118  BILITOT 0.4  PROT 5.8*  ALBUMIN 2.4*   No results for input(s): LIPASE, AMYLASE in the last 168 hours. No results for input(s): AMMONIA in the last 168 hours. Coagulation Profile: No results for input(s): INR, PROTIME in the last 168 hours. Cardiac Enzymes: No results for input(s): CKTOTAL, CKMB, CKMBINDEX, TROPONINI in the last 168 hours. BNP (last 3 results) No results for input(s): PROBNP in the last 8760 hours. HbA1C: No results for input(s): HGBA1C in the last 72 hours. CBG: Recent Labs  Lab 10/07/20 1052 10/07/20 1309 10/07/20 1313 10/07/20 1339 10/07/20 1507  GLUCAP 80 43* 39* 78 100*   Lipid Profile: No results for input(s): CHOL, HDL, LDLCALC, TRIG, CHOLHDL, LDLDIRECT in the last 72 hours. Thyroid Function Tests: No results for input(s): TSH, T4TOTAL, FREET4, T3FREE, THYROIDAB in the last 72  hours. Anemia Panel: No results for input(s): VITAMINB12, FOLATE, FERRITIN, TIBC, IRON, RETICCTPCT in the last 72 hours. Urine analysis:    Component Value Date/Time   COLORURINE YELLOW 10/07/2020 1622   APPEARANCEUR CLEAR 10/07/2020 1622   LABSPEC 1.016 10/07/2020 1622   PHURINE 6.0 10/07/2020 1622   GLUCOSEU NEGATIVE 10/07/2020 1622   GLUCOSEU NEGATIVE 03/05/2016 1203   [place] 10/07/2020 1622   BILIRUBINUR NEGATIVE 10/07/2020 1622   KETONESUR NEGATIVE 10/07/2020 1622   PROTEINUR NEGATIVE 10/07/2020 1622   UROBILINOGEN 0.2 03/05/2016 1203   NITRITE NEGATIVE 10/07/2020 1622   LEUKOCYTESUR NEGATIVE 10/07/2020 1622   Sepsis Labs: No results found for this or any previous visit (from the past 240 hour(s)).   Radiological Exams on Admission: DG Abd 1 View  Result Date: 10/07/2020 CLINICAL DATA:  NG tube placement EXAM: ABDOMEN - 1 VIEW COMPARISON:  None. FINDINGS: Enteric tube overlies the right lung base and is likely within and airway. Unremarkable bowel gas pattern. Extensive thoracolumbar fusion construct. IMPRESSION: Enteric tube is likely malposition within right lower lobe airway. These results will be called to the ordering clinician or representative by the Radiologist Assistant, and communication documented in the PACS or [employer]. Electronically Signed  By: Macy Mis M.D.   On: 10/07/2020 14:43   DG Chest Port 1 View  Result Date: 10/07/2020 CLINICAL DATA:  Aspiration pneumonia EXAM: PORTABLE CHEST 1 VIEW COMPARISON:  09/21/2020 FINDINGS: Single frontal view of the chest demonstrates stable tracheostomy tube. The enteric catheter seen previously has been removed. Cardiac silhouette is stable. Persistent bibasilar consolidation and effusions, right greater than left. No evidence of pneumothorax. No acute bony abnormalities. Stable postsurgical changes of the thoracic spine. IMPRESSION: 1. Stable bibasilar consolidation and effusions, right greater than left.  Findings could reflect airspace disease, aspiration, or atelectasis. Electronically Signed   By: Randa Ngo M.D.   On: 10/07/2020 15:35    EKG: Independently reviewed.  Normal sinus rhythm at 68 beats per  Assessment/Plan Diabetes mellitus type 2 with hypoglycemia: Acute.  Patient's tube feeds had to be discontinued after he had recently dislodged his cortrak.  He had been on a sliding scale insulin and glucose level dropped down to 36.  Hemoglobin A1c was noted to be 6.4 on 4/3.  Patient had been given IM glucagon twice and subsequently placed on D5-0.45% at 50 mL/h. -Admit to progressive bed -Hypoglycemic protocols -Hold insulin -CBGs every 4 hours -Continue D5-0.45% normal saline at 50 mL/h per hour overnight  Dislodged feeding tube dysphagia: Acute.  Patient had dislodged his cortrak this a.m. and has not been able to receive any of his oral medications or tube feeds.  Most likely long-term patient would benefit from PEG tube, but it appears decision pending biopsy results -Order placed for NG tube to be placed.  Nursing attempted twice and then I attempted twice to place the NG tube, but it was also unsuccessful due to it coiling up into the patient's mouth due to his trach -IR order placed for possible placement of Cortak or NGT -Resume tube feeds with free water when able  Acute metabolic encephalopathy: Patient was noted to be acutely altered and agitated this morning. 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  Patient noted to have copious secretions requiring frequent suctioning.  Patient currently on 10 L nasal cannula oxygen. -Continuous pulse oximetry with nasal cannula oxygen as needed. -Respiratory therapy consulted to help assist -Continue tracheostomy  care with frequent suctioning -Continue Mucomyst, albuterol, hypertonic saline, and chest physiotherapy  Aspiration pneumonia: Patient was thought to possibly have aspirated.  Chest x-ray showing stable bibasilar consolidation and effusions right greater than left.  Patient has been started on empiric antibiotics of vancomycin and Zosyn while in rehab as there was concern for possible underlying infection with white blood cell count elevated. -Continue vancomycin and Zosyn, but de-escalate when medically appropriate  Anemia of critical illness: Hemoglobin 8.1 g/dL and baseline range currently has been between 7 and 8 g/dL currently. -Continue to monitor  Klebsiella pneumonia with loculated left-sided effusion: Patient had completed treatment with IV Rocephin.  Paroxysmal atrial fibrillation: Patient appears to be in sinus rhythm at this time. -Lovenox for A. 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  He underwent biopsy by Dr.Skotnicki on 10/04/2020 -Follow-up surgical pathology  Acute diarrhea History of Crohn's disease: Patient follows  with Dr. Earlean Shawl in the outpatient setting.  He had been on mesalamine 1600 mg twice daily and as needed loperamide, but medications cannot be given through feeding tube. -Continue Flexi-Seal  T11-T12 Chance fracture: Status post stabilization by neurosurgery on 4/8 -Continue PT/OT  History of adenocarcinoma s/p left upper lobectomy in 2016 History of prostate and thyroid cancer   Hypothyroidism -Continue Synthroid p.o. when able     DVT prophylaxis: Lovenox per pharmacy Code Status: Full Family Communication: Daughter updated at bedside Disposition Plan: Likely back to CIR once medically stable Consults called: Order placed  for IR, Admission status: Inpatient, likely requiring more than 2 midnight stay for stabilizing  Norval Morton MD [facility]   If 7PM-7AM, please contact night-coverage   10/07/2020, 4:56 PM

## 2020-10-07 NOTE — Progress Notes (Signed)
Patient transferred to 2 [place]. Report given to charge nurse. Patient transferred with all belongings, on continuous pulse-ox, saturation at 88-90% on 10 L FiO2 40%. D5 1/2NS running at 62m/hr on R wrist PIV. Patient alert and oriented to person with episodes of increased confusion and agitation despite PRN ativan being administered earlier today. Daughter remains at bedside and was updated on patient's condition. Last CBG 100 at 1507. Urinalysis and culture obtained prior to transfer.

## 2020-10-07 NOTE — Progress Notes (Signed)
0.75 ml of Ativan wasted witnessed by charge nurse Dwaine Gale, RN

## 2020-10-07 NOTE — Progress Notes (Signed)
Physical Therapy Session Note  Patient Details  Name: DELFIN SQUILLACE MRN: [id number] Date of Birth: 1941/05/12  Today's Date: 10/07/2020 PT Missed Time: 22 Minutes Missed Time Reason: MD hold (Comment)  Short Term Goals: No short term goals set  Skilled Therapeutic Interventions/Progress Updates:    Verbal MD hold order received AM until NGT placed. NGT not placed by time of eval- tx could not be provided without eval and no verbal/written order to remove MD hold provided.   Therapy Documentation Precautions:  Restrictions Weight Bearing Restrictions: No    Therapy/Group: Individual Therapy  Karoline Caldwell, PT, DPT, CBIS  10/07/2020, 7:45 AM

## 2020-10-07 NOTE — Progress Notes (Signed)
RT NOTES: CPT held at this time. Pt very agitated at this time.

## 2020-10-07 NOTE — Progress Notes (Signed)
Occupational Therapy Session Note  Patient Details  Name: Devin [name] MRN: [id number] Date of Birth: March 15, 1941  Today's Date: 10/07/2020 OT Individual Time:  - 60 minutes missed and 45 minutes missed (105 minutes in total)      Skilled Therapeutic Interventions/Progress Updates:    Per Dr. Ranell Patrick, pt on bedrest/medical hold due to medical reasons. Therefore OT evaluation could not be completed. Treatment time missed.  Therapy Documentation Precautions:  Restrictions Weight Bearing Restrictions: No General: General OT Amount of Missed Time: 105 Minutes PT Missed Treatment Reason: MD hold (Comment) (Dr. Adam Phenix put pt on hold due to hypglycemia and pt having pulled Cor-Trak.)   Therapy/Group: Individual Therapy  Logan Baltimore A Satin Boal 10/07/2020, 12:48 PM

## 2020-10-07 NOTE — Progress Notes (Addendum)
PROGRESS NOTE   Subjective/Complaints: Devin [name] pulled out his Cortrak this morning. Cortrak team not available this weekend. RN Clifton James to place NGT, will order KUB afterward. CBG 54 this AM- received glucagon and improved to 72. Will check again in 2 hours if NGT has not yet arrived  ROS: was experiencing discomfort form NGT  Objective:   No results found. Recent Labs    10/06/20 0151 10/07/20 0526  WBC 16.8* 14.0*  HGB 7.7* 8.1*  HCT 25.6* 26.4*  PLT 554* 497*   Recent Labs    10/06/20 0151 10/07/20 0526  NA 134* 133*  K 5.1 4.5  CL 89* 89*  CO2 38* 35*  GLUCOSE 116* 87  BUN 77* 69*  CREATININE 1.47* 1.20  CALCIUM 8.9 8.9    Intake/Output Summary (Last 24 hours) at 10/07/2020 0858 Last data filed at 10/07/2020 0830 Gross per 24 hour  Intake 0 ml  Output 2700 ml  Net -2700 ml     Pressure Injury 09/12/20 Back Mid;Lower Stage 1 -  Intact skin with non-blanchable redness of a localized area usually over a bony prominence. Blister-like skin breakdown from patients TSLO brace (Active)  09/12/20 2000  Location: Back  Location Orientation: Mid;Lower  Staging: Stage 1 -  Intact skin with non-blanchable redness of a localized area usually over a bony prominence.  Wound Description (Comments): Blister-like skin breakdown from patients TSLO brace  Present on Admission: Yes    Physical Exam: Vital Signs Blood pressure (!) 126/48, pulse 67, temperature 97.9 F (36.6 C), temperature source Oral, resp. rate 16, weight 93.3 kg, SpO2 100 %. Gen: no distress, normal appearing, BMI 30.36 HEENT: oral mucosa pink and moist, NCAT, tracheostomy tube with thick copious secretions Cardio: Reg rate Chest: normal effort, normal rate of breathing Abd: soft, non-distended Ext: bilateral lower extremity 2+ edema Psych: pleasant, normal affect Skin: diffuse bruising, IV in right hand, stage 1 PI to mid lower back. Neuro:  Alert and oriented x3. Nods head and moves lips in answer to questions Musculoskeletal: 5/5 strength throughout  Assessment/Plan: 1. Functional deficits which require 3+ hours per day of interdisciplinary therapy in a comprehensive inpatient rehab setting.  Physiatrist is providing close team supervision and 24 hour management of active medical problems listed below.  Physiatrist and rehab team continue to assess barriers to discharge/monitor patient progress toward functional and medical goals  Care Tool:  Bathing              Bathing assist       Upper Body Dressing/Undressing Upper body dressing        Upper body assist      Lower Body Dressing/Undressing Lower body dressing            Lower body assist       Toileting Toileting    Toileting assist       Transfers Chair/bed transfer  Transfers assist           Locomotion Ambulation   Ambulation assist              Walk 10 feet activity   Assist  Walk 50 feet activity   Assist           Walk 150 feet activity   Assist           Walk 10 feet on uneven surface  activity   Assist           Wheelchair     Assist               Wheelchair 50 feet with 2 turns activity    Assist            Wheelchair 150 feet activity     Assist          Blood pressure (!) 126/48, pulse 67, temperature 97.9 F (36.6 C), temperature source Oral, resp. rate 16, weight 93.3 kg, SpO2 100 %.  Medical Problem List and Plan: 1.  Thoracic vertebral fracture             -patient may not shower             -ELOS/Goals: modI 10-14 days             -Initial CIR evals 10/08/20 2.  Antithrombotics: -DVT/anticoagulation:  Mechanical: Sequential compression devices, below knee Bilateral lower extremities             -antiplatelet therapy: N/A 3. Pain Management: Continue Oxycodone prn 4. Agitation: LCSW to follow for evaluation and support.               -antipsychotic agents: N/a  -prn Seroquel 68m BID PRN. Left voicemail with wife to discuss 5. Neuropsych: This patient is not fully capable of making decisions on his own behalf. 6. Skin/Wound Care: Routine pressure relief measures 7. Fluids/Electrolytes/Nutrition: Strict I/O with daily weights. Prosource bid.              --Nephro tube feeds with free water 100 cc tid.   -Reinsert NGT today, KUB after insertion, SLP eval 8. T11 chance fracture s/p  9. VDRF/COPD/hypercarbic respiratory failure: Continue oxygen per ATC and titrate with activity             ---frequent suctioning with mucomyst to help manage secretions.              --Robitussin DM TID as well as Pulmicort/Yupelri nebs  10. Chronic  A fib: Monitor HR tid--continue Cardizem qid             --Eliquis continues to be on hold 11. Anxiety d/o: Continue buspar bid 12. T2DM: On Lantus 12 units bid with SSI for elevated BS             --Continue to monitor BS every 4 hours and titrate insulin as indicated.              CBGs 99-123 on 4/29 13. Acute on chronic renal failure: Baseline SCr 1.4 but BUN on rise-->may need to stay dry.              -- monitor daily weights (inconsistent) 181-->196 lbs overnight and ? Accurate.   4/30 Cr is 1.20- repeat Monday 14.  Hyperkalemia: K+ 5.1 today-> will continue daily BMET.              --appreciate nephrology assistance.  15. Crohn's disease: Rectal tube to manage diarrhea             --continue lomotil qid. Will add probiotic.  16. Soft tissue neck mass: S/p biopsy 04/27 per Dr/ Skotnicki 17. Hypoglycemic: hold lantus: glucagon administered  and brought up BCG to 54 from 60, repeat in [place], discussed with RN Clifton James    >35 minutes spent in discussion with RN, wife, patient, pharmacy regarding placing new NTG, agitation, ordering IB Ativan and bilateral wrist restraints, administration of glucagon for elevated CBGs, review of labs, ordering CXR after NGT placement, discussion with therapy  regarding holding therapy today  Addendum: patient to be transferred to acute care given worsening agitation despite IM Ativan, concern for pulling out trach, inability to tolerate NGT insertion and thus to receive his medications, severe hypoglycemia, questionable infection given luekocytosis and agitation (UA/UC and CXR ordered and pending), discussed with acute care MD, rehab RN, and patient's daughter. Patient has bed in 2W37  LOS: 1 days A FACE TO [place] Crislyn Willbanks 10/07/2020, 8:58 AM

## 2020-10-07 NOTE — Progress Notes (Signed)
NGT placement attempted twice, unsuccessful. Md made aware.

## 2020-10-07 NOTE — Progress Notes (Signed)
Pt arrived with no NG tube. SWOT inserted 2X without success. DR Tamala Julian inserted 2X with no success. Pharmacy was consulted for medications that can be given IV route since pt cannot swallow and no tube present.

## 2020-10-08 ENCOUNTER — Inpatient Hospital Stay (HOSPITAL_COMMUNITY): Payer: Medicare Other

## 2020-10-08 DIAGNOSIS — R262 Difficulty in walking, not elsewhere classified: Secondary | ICD-10-CM | POA: Diagnosis not present

## 2020-10-08 LAB — CBC WITH DIFFERENTIAL/PLATELET
Abs Immature Granulocytes: 0.21 10*3/uL — ABNORMAL HIGH (ref 0.00–0.07)
Basophils Absolute: 0 10*3/uL (ref 0.0–0.1)
Basophils Relative: 0 %
Eosinophils Absolute: 0 10*3/uL (ref 0.0–0.5)
Eosinophils Relative: 0 %
HCT: 27.3 % — ABNORMAL LOW (ref 39.0–52.0)
Hemoglobin: 8.4 g/dL — ABNORMAL LOW (ref 13.0–17.0)
Immature Granulocytes: 1 %
Lymphocytes Relative: 7 %
Lymphs Abs: 1.2 10*3/uL (ref 0.7–4.0)
MCH: 29.7 pg (ref 26.0–34.0)
MCHC: 30.8 g/dL (ref 30.0–36.0)
MCV: 96.5 fL (ref 80.0–100.0)
Monocytes Absolute: 1.9 10*3/uL — ABNORMAL HIGH (ref 0.1–1.0)
Monocytes Relative: 11 %
Neutro Abs: 13.5 10*3/uL — ABNORMAL HIGH (ref 1.7–7.7)
Neutrophils Relative %: 81 %
Platelets: 520 10*3/uL — ABNORMAL HIGH (ref 150–400)
RBC: 2.83 MIL/uL — ABNORMAL LOW (ref 4.22–5.81)
RDW: 13.8 % (ref 11.5–15.5)
WBC: 16.9 10*3/uL — ABNORMAL HIGH (ref 4.0–10.5)
nRBC: 0 % (ref 0.0–0.2)

## 2020-10-08 LAB — BASIC METABOLIC PANEL
Anion gap: 8 (ref 5–15)
BUN: 42 mg/dL — ABNORMAL HIGH (ref 8–23)
CO2: 37 mmol/L — ABNORMAL HIGH (ref 22–32)
Calcium: 8.8 mg/dL — ABNORMAL LOW (ref 8.9–10.3)
Chloride: 91 mmol/L — ABNORMAL LOW (ref 98–111)
Creatinine, Ser: 1 mg/dL (ref 0.61–1.24)
GFR, Estimated: >60 mL/min (ref >60)
Glucose, Bld: 93 mg/dL (ref 70–99)
Potassium: 4.5 mmol/L (ref 3.5–5.1)
Sodium: 136 mmol/L (ref 135–145)

## 2020-10-08 LAB — GLUCOSE, CAPILLARY
Glucose-Capillary: 109 mg/dL — ABNORMAL HIGH (ref 70–99)
Glucose-Capillary: 74 mg/dL (ref 70–99)
Glucose-Capillary: 76 mg/dL (ref 70–99)
Glucose-Capillary: 79 mg/dL (ref 70–99)
Glucose-Capillary: 93 mg/dL (ref 70–99)

## 2020-10-08 LAB — URINE CULTURE: Culture: NO GROWTH

## 2020-10-08 MED ORDER — PROSOURCE TF PO LIQD
45.0000 mL | Freq: Every day | ORAL | Status: DC
  Administered 2020-10-08 – 2020-10-12 (×5): 45 mL
  Filled 2020-10-08 (×5): qty 45

## 2020-10-08 MED ORDER — TIOTROPIUM BROMIDE MONOHYDRATE 2.5 MCG/ACT IN AERS: 2.0000 | INHALATION_SPRAY | Freq: Every day | RESPIRATORY_TRACT | Status: DC

## 2020-10-08 MED ORDER — ATENOLOL 25 MG PO TABS
25.0000 mg | ORAL_TABLET | Freq: Two times a day (BID) | ORAL | Status: DC
  Administered 2020-10-08 – 2020-10-12 (×8): 25 mg
  Filled 2020-10-08 (×8): qty 1

## 2020-10-08 MED ORDER — UMECLIDINIUM BROMIDE 62.5 MCG/INH IN AEPB
1.0000 | INHALATION_SPRAY | Freq: Every day | RESPIRATORY_TRACT | Status: DC
  Filled 2020-10-08: qty 7

## 2020-10-08 MED ORDER — LIDOCAINE VISCOUS HCL 2 % MT SOLN
15.0000 mL | OROMUCOSAL | Status: DC | PRN
  Administered 2020-10-08: 2 mL via OROMUCOSAL
  Filled 2020-10-08: qty 15

## 2020-10-08 MED ORDER — ALBUTEROL SULFATE (2.5 MG/3ML) 0.083% IN NEBU: 2.5000 mg | INHALATION_SOLUTION | Freq: Four times a day (QID) | RESPIRATORY_TRACT | Status: DC | PRN

## 2020-10-08 MED ORDER — LEVOTHYROXINE SODIUM 25 MCG PO TABS
125.0000 ug | ORAL_TABLET | Freq: Every day | ORAL | Status: DC
  Administered 2020-10-09 – 2020-10-12 (×4): 125 ug
  Filled 2020-10-08 (×4): qty 1

## 2020-10-08 MED ORDER — LOPERAMIDE HCL 1 MG/7.5ML PO SUSP
4.0000 mg | Freq: Four times a day (QID) | ORAL | Status: DC
  Administered 2020-10-08 – 2020-10-12 (×17): 4 mg
  Filled 2020-10-08 (×18): qty 30

## 2020-10-08 MED ORDER — APIXABAN 5 MG PO TABS
5.0000 mg | ORAL_TABLET | Freq: Two times a day (BID) | ORAL | Status: DC
  Administered 2020-10-08 – 2020-10-12 (×8): 5 mg
  Filled 2020-10-08 (×8): qty 1

## 2020-10-08 MED ORDER — FREE WATER
100.0000 mL | Freq: Three times a day (TID) | Status: DC
  Administered 2020-10-08 – 2020-10-09 (×4): 100 mL

## 2020-10-08 MED ORDER — ONDANSETRON HCL 4 MG/2ML IJ SOLN: 4.0000 mg | Freq: Four times a day (QID) | INTRAMUSCULAR | Status: DC | PRN

## 2020-10-08 MED ORDER — LEVOTHYROXINE SODIUM 100 MCG/5ML IV SOLN: 75.0000 ug | Freq: Every day | INTRAVENOUS | Status: DC

## 2020-10-08 MED ORDER — CHOLESTYRAMINE 4 G PO PACK
4.0000 g | PACK | Freq: Two times a day (BID) | ORAL | Status: DC
  Administered 2020-10-08 – 2020-10-12 (×9): 4 g
  Filled 2020-10-08 (×9): qty 1

## 2020-10-08 MED ORDER — NEPRO/CARBSTEADY PO LIQD
1000.0000 mL | ORAL | Status: DC
  Administered 2020-10-08: 1000 mL
  Filled 2020-10-08 (×2): qty 1000

## 2020-10-08 MED ORDER — DILTIAZEM 12 MG/ML ORAL SUSPENSION
30.0000 mg | Freq: Four times a day (QID) | ORAL | Status: DC
  Administered 2020-10-08 – 2020-10-10 (×7): 30 mg
  Filled 2020-10-08 (×2): qty 3
  Filled 2020-10-08 (×3): qty 6
  Filled 2020-10-08 (×5): qty 3
  Filled 2020-10-08: qty 6

## 2020-10-08 MED ORDER — GERHARDT'S BUTT CREAM: 1.0000 "application " | TOPICAL_CREAM | Freq: Two times a day (BID) | CUTANEOUS | Status: DC

## 2020-10-08 MED ORDER — JUVEN PO PACK
1.0000 | PACK | Freq: Two times a day (BID) | ORAL | Status: DC
  Administered 2020-10-09 – 2020-10-12 (×8): 1
  Filled 2020-10-08 (×8): qty 1

## 2020-10-08 NOTE — Progress Notes (Signed)
VASCULAR LAB    Bilateral lower extremity venous duplex has been performed.  See CV proc for preliminary results.   Ziair Penson, RVT 10/08/2020, 10:33 AM

## 2020-10-08 NOTE — Progress Notes (Signed)
Inpatient Rehabilitation Admissions Coordinator  Patient admitted to [facility] 4/29 and readmitted to acute hospital on 4/20 due to medical issues. I will follow up on Monday.  Danne Baxter, RN, MSN Rehab Admissions Coordinator [phone] 10/08/2020 7:53 AM

## 2020-10-08 NOTE — Progress Notes (Signed)
PROGRESS NOTE   Devin [name]  IOX:[id number] DOB: [birth year] DOA: 10/07/2020 PCP: Prince Solian, MD  Brief Narrative:   80 year old white male A. 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 Isaias Cowman 4/27  Patient subsequently came back to acute care because of dislodged  Core track which was accidental Patient became hypoglycemic and encephalopathic--patient was placed on D5 and has improved His antibiotics have been continued given concerns for aspiration and significant secretions  Hospital-Problem based course  Dislodged Coretrack Cannot be replaced except m-f by the core track team D/w Dr. Rexanne Mano [facility] for them to coordinate the care--discussed with Dr. Redmond Baseman We will convert all meds to IV for now and await decision with regards to placement of G-tube--- might need fluoroscopy guidance to place the feeding tube May need PEG tube?  To be addressed in the outpatient setting Metabolic encephalopathy Likely secondary to severe hypoglycemia Resolved discontinue sitter Severe hypoglycemia on admission Currently on D5 0.45 saline 50 cc/H--sugars are now more acceptable 70-93 range Increased rate to 75 cc/H Resume Nepro Prosource and Juven Klebsiella pneumonia from prior to admission Concern however for superimposed aspiration pneumonia Continue vancomycin Zosyn Postsurgical hypothyroidism Replace with IV Synthroid for now Home meds when able to get tube placed Laryngeal carcinoma Follow-up with Dr. Roseanna Rainbow  in the outpatient setting Paroxysmal A. fib not currently on anticoagulation Had intermittent A. fib on last hospital stay Eliquis held Currently in sinus rhythm on monitor If heart rate above 120 and develops RVR, will need Cardizem gtt. 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   Difficult to place patient No  Consultants:   Interventional radiology Dr. Anselm Pancoast  ENT Dr. Lorin Mercy  Procedures:   Antimicrobials: Vancomycin Zosyn   Subjective: Patient alert oriented calm can voice fairly well Thick secretions at tracheotomy-cleaned at the bedside He is able to motion with his hands and can voice fairly well He does not remember how things occurred however did not pull out the core track He can voice to me where he is  Objective: Vitals:   10/08/20 0318 10/08/20 0432 10/08/20 0700 10/08/20 0830  BP:  (!) 119/55 133/67   Pulse: 70 66 69 82  Resp: 19 16 16 18   Temp:  97.7 F (36.5 C) 97.9 F (36.6 C)   TempSrc:  Axillary Axillary   SpO2: 100% 100% 100% 100%  Weight:  91.5 kg      Intake/Output Summary (Last 24 hours) at 10/08/2020 0928 Last data filed at 10/08/2020 0710 Gross per 24 hour  Intake 440.72 ml  Output 1700 ml  Net -1259.28 ml  Filed Weights   10/08/20 0432  Weight: 91.5 kg    Examination:  Chronically ill-appearing white male no distress Tracheotomy in place thick tan secretions Anteriorly the chest is clear Abdomen is soft nontender no rebound no guarding Foley catheter in place I did  not examine sacrum He is moving all 4 limbs equally He tracks well my finger His power is 5/5  Data Reviewed: personally reviewed   CBC    Component Value Date/Time   WBC 16.9 (H) 10/08/2020 0254   RBC 2.83 (L) 10/08/2020 0254   HGB 8.4 (L) 10/08/2020 0254   HGB 12.9 (L) 09/06/2019 0924   HGB 12.6 (L) 02/18/2017 1011   HCT 27.3 (L) 10/08/2020 0254   HCT 37.3 (L) 02/18/2017 1011   PLT 520 (H) 10/08/2020 0254   PLT 261 09/06/2019 0924   PLT 246 02/18/2017 1011   MCV 96.5 10/08/2020 0254   MCV 94.2 02/18/2017 1011   MCH 29.7 10/08/2020 0254   MCHC 30.8 10/08/2020 0254   RDW 13.8 10/08/2020 0254   RDW 13.3 02/18/2017 1011   LYMPHSABS 1.2 10/08/2020 0254   LYMPHSABS 1.6 02/18/2017 1011   MONOABS 1.9 (H) 10/08/2020 0254   MONOABS 0.6 02/18/2017 1011   EOSABS 0.0 10/08/2020 0254   EOSABS 0.2 02/18/2017 1011   BASOSABS 0.0 10/08/2020 0254   BASOSABS 0.0 02/18/2017 1011   CMP Latest Ref Rng & Units 10/08/2020 10/07/2020 10/06/2020  Glucose 70 - 99 mg/dL 93 87 116(H)  BUN 8 - 23 mg/dL 42(H) 69(H) 77(H)  Creatinine 0.61 - 1.24 mg/dL 1.00 1.20 1.47(H)  Sodium 135 - 145 mmol/L 136 133(L) 134(L)  Potassium 3.5 - 5.1 mmol/L 4.5 4.5 5.1  Chloride 98 - 111 mmol/L 91(L) 89(L) 89(L)  CO2 22 - 32 mmol/L 37(H) 35(H) 38(H)  Calcium 8.9 - 10.3 mg/dL 8.8(L) 8.9 8.9  Total Protein 6.5 - 8.1 g/dL - 5.8(L) -  Total Bilirubin 0.3 - 1.2 mg/dL - 0.4 -  Alkaline Phos 38 - 126 U/L - 118 -  AST 15 - 41 U/L - 15 -  ALT 0 - 44 U/L - 11 -     Radiology Studies: DG Abd 1 View  Result Date: 10/07/2020 CLINICAL DATA:  NG tube placement EXAM: ABDOMEN - 1 VIEW COMPARISON:  None. FINDINGS: Enteric tube overlies the right lung base and is likely within and airway. Unremarkable bowel gas pattern. Extensive thoracolumbar fusion construct. IMPRESSION: Enteric tube is likely malposition within right lower lobe airway. These results will be called to the ordering clinician or representative by the Radiologist  Assistant, and communication documented in the PACS or [employer]. Electronically Signed   By: Macy Mis M.D.   On: 10/07/2020 14:43   DG Chest Port 1 View  Result Date: 10/07/2020 CLINICAL DATA:  Aspiration pneumonia EXAM: PORTABLE CHEST 1 VIEW COMPARISON:  09/21/2020 FINDINGS: Single frontal view of the chest demonstrates stable tracheostomy tube. The enteric catheter seen previously has been removed. Cardiac silhouette is stable. Persistent bibasilar consolidation and effusions, right greater than left. No evidence of pneumothorax. No acute bony abnormalities. Stable postsurgical changes of the thoracic spine. IMPRESSION: 1. Stable bibasilar consolidation and effusions, right greater than left. Findings could reflect airspace disease, aspiration, or atelectasis. Electronically Signed   By: Randa Ngo M.D.   On: 10/07/2020 15:35     Scheduled Meds: . acetaminophen (TYLENOL) oral liquid 160 mg/5 mL  500 mg Per Tube Q6H  . acidophilus  2 capsule Oral TID  . budesonide (PULMICORT)  nebulizer solution  0.5 mg Nebulization BID  . chlorhexidine  15 mL Mouth/Throat BID  . enoxaparin (LOVENOX) injection  90 mg Subcutaneous Q12H  . Gerhardt's butt cream   Topical BID  . [START ON 10/11/2020] levothyroxine  75 mcg Intravenous Daily  . mouth rinse  15 mL Mouth Rinse 10 times per day   Continuous Infusions: . dextrose 5 % and 0.45% NaCl    . feeding supplement (NEPRO CARB STEADY)    . ferric gluconate (FERRLECIT/NULECIT) IV    . piperacillin-tazobactam (ZOSYN)  IV 3.375 g (10/08/20 0430)  . vancomycin       LOS: 1 day   Time spent: 75 minutes  Greater than 30 minutes of time was spent in care coordination with ENT and radiology  Nita Sells, MD [facility] To contact the attending provider between 7A-7P or the covering provider during after hours 7P-7A, please log into the web site [email or web address] and access using universal [place] password for that web site. If  you do not have the password, please call the hospital operator.  10/08/2020, 9:28 AM

## 2020-10-08 NOTE — Progress Notes (Signed)
Discussed with patient's wife/daughter at the bedside plan of care Discussed with charge RN-might be beneficial for patient to be closer to the front of the unit for supervision in order not to dislodge the long tube--if needed can order telemetry sitter if this is the case Have resumed majority of needed meds including feeds--we will hold at this time on pain meds narcotics given he is quite sleepy at this time.  Verneita Griffes, MD [facility] 4:17 PM

## 2020-10-08 NOTE — Progress Notes (Signed)
PT Cancellation Note  Patient Details Name: Devin [name] MRN: [id number] DOB: 1941/03/16   Cancelled Treatment:    Reason Eval/Treat Not Completed: Patient at procedure or test/unavailable Pt off floor. Will follow up next available time.   Marguarite Arbour A Kimberlee Shoun 10/08/2020, 11:38 AM Marisa Severin, PT, DPT Acute Rehabilitation Services Pager [phone] Office [phone]

## 2020-10-08 NOTE — Progress Notes (Signed)
RT at bedside for 12:00 rounds. Pt is not in room at this time. RN stated pt was in IR.

## 2020-10-09 ENCOUNTER — Inpatient Hospital Stay (HOSPITAL_COMMUNITY): Payer: Medicare Other

## 2020-10-09 ENCOUNTER — Encounter: Payer: Self-pay | Admitting: Family Medicine

## 2020-10-09 LAB — GLUCOSE, CAPILLARY
Glucose-Capillary: 124 mg/dL — ABNORMAL HIGH (ref 70–99)
Glucose-Capillary: 128 mg/dL — ABNORMAL HIGH (ref 70–99)
Glucose-Capillary: 149 mg/dL — ABNORMAL HIGH (ref 70–99)
Glucose-Capillary: 181 mg/dL — ABNORMAL HIGH (ref 70–99)
Glucose-Capillary: 201 mg/dL — ABNORMAL HIGH (ref 70–99)
Glucose-Capillary: 214 mg/dL — ABNORMAL HIGH (ref 70–99)
Glucose-Capillary: 43 mg/dL — CL (ref 70–99)
Glucose-Capillary: 98 mg/dL (ref 70–99)

## 2020-10-09 LAB — CBC WITH DIFFERENTIAL/PLATELET
Abs Immature Granulocytes: 0.29 10*3/uL — ABNORMAL HIGH (ref 0.00–0.07)
Basophils Absolute: 0 10*3/uL (ref 0.0–0.1)
Basophils Relative: 0 %
Eosinophils Absolute: 0.1 10*3/uL (ref 0.0–0.5)
Eosinophils Relative: 1 %
HCT: 25.7 % — ABNORMAL LOW (ref 39.0–52.0)
Hemoglobin: 7.6 g/dL — ABNORMAL LOW (ref 13.0–17.0)
Immature Granulocytes: 2 %
Lymphocytes Relative: 9 %
Lymphs Abs: 1 10*3/uL (ref 0.7–4.0)
MCH: 29.3 pg (ref 26.0–34.0)
MCHC: 29.6 g/dL — ABNORMAL LOW (ref 30.0–36.0)
MCV: 99.2 fL (ref 80.0–100.0)
Monocytes Absolute: 1.2 10*3/uL — ABNORMAL HIGH (ref 0.1–1.0)
Monocytes Relative: 10 %
Neutro Abs: 9.3 10*3/uL — ABNORMAL HIGH (ref 1.7–7.7)
Neutrophils Relative %: 78 %
Platelets: 433 10*3/uL — ABNORMAL HIGH (ref 150–400)
RBC: 2.59 MIL/uL — ABNORMAL LOW (ref 4.22–5.81)
RDW: 14.1 % (ref 11.5–15.5)
WBC: 11.9 10*3/uL — ABNORMAL HIGH (ref 4.0–10.5)
nRBC: 0 % (ref 0.0–0.2)

## 2020-10-09 LAB — COMPREHENSIVE METABOLIC PANEL WITH GFR
ALT: 11 U/L (ref 0–44)
AST: 17 U/L (ref 15–41)
Albumin: 1.9 g/dL — ABNORMAL LOW (ref 3.5–5.0)
Alkaline Phosphatase: 85 U/L (ref 38–126)
Anion gap: 5 (ref 5–15)
BUN: 30 mg/dL — ABNORMAL HIGH (ref 8–23)
CO2: 36 mmol/L — ABNORMAL HIGH (ref 22–32)
Calcium: 8.3 mg/dL — ABNORMAL LOW (ref 8.9–10.3)
Chloride: 95 mmol/L — ABNORMAL LOW (ref 98–111)
Creatinine, Ser: 1 mg/dL (ref 0.61–1.24)
GFR, Estimated: >60 mL/min
Glucose, Bld: 159 mg/dL — ABNORMAL HIGH (ref 70–99)
Potassium: 4.5 mmol/L (ref 3.5–5.1)
Sodium: 136 mmol/L (ref 135–145)
Total Bilirubin: 0.5 mg/dL (ref 0.3–1.2)
Total Protein: 5 g/dL — ABNORMAL LOW (ref 6.5–8.1)

## 2020-10-09 LAB — PROCALCITONIN: Procalcitonin: 3.71 ng/mL

## 2020-10-09 LAB — SURGICAL PATHOLOGY

## 2020-10-09 MED ORDER — CHLORHEXIDINE GLUCONATE CLOTH 2 % EX PADS
6.0000 | MEDICATED_PAD | Freq: Every day | CUTANEOUS | Status: DC
  Administered 2020-10-09 – 2020-10-11 (×3): 6 via TOPICAL

## 2020-10-09 MED ORDER — INSULIN ASPART 100 UNIT/ML IJ SOLN
0.0000 [IU] | INTRAMUSCULAR | Status: DC
  Administered 2020-10-09: 3 [IU] via SUBCUTANEOUS
  Administered 2020-10-09: 1 [IU] via SUBCUTANEOUS
  Administered 2020-10-09: 3 [IU] via SUBCUTANEOUS
  Administered 2020-10-10 (×4): 1 [IU] via SUBCUTANEOUS
  Administered 2020-10-11: 2 [IU] via SUBCUTANEOUS
  Administered 2020-10-11 – 2020-10-12 (×7): 1 [IU] via SUBCUTANEOUS

## 2020-10-09 MED ORDER — ALPRAZOLAM 0.5 MG PO TABS
0.5000 mg | ORAL_TABLET | Freq: Every day | ORAL | Status: DC
  Administered 2020-10-09: 0.5 mg via ORAL
  Filled 2020-10-09: qty 1

## 2020-10-09 MED ORDER — ALPRAZOLAM 0.5 MG PO TABS
0.5000 mg | ORAL_TABLET | Freq: Every day | ORAL | Status: DC
  Administered 2020-10-10: 0.5 mg
  Filled 2020-10-09: qty 1

## 2020-10-09 MED ORDER — LOPERAMIDE HCL 1 MG/7.5ML PO SUSP
2.0000 mg | Freq: Two times a day (BID) | ORAL | Status: DC | PRN
  Filled 2020-10-09: qty 15

## 2020-10-09 MED ORDER — OXYCODONE HCL 5 MG/5ML PO SOLN
5.0000 mg | ORAL | Status: DC | PRN
  Administered 2020-10-11 – 2020-10-12 (×2): 5 mg
  Filled 2020-10-09 (×2): qty 5

## 2020-10-09 MED ORDER — FREE WATER
170.0000 mL | Status: DC
  Administered 2020-10-09 – 2020-10-12 (×18): 170 mL

## 2020-10-09 MED ORDER — GLUCERNA 1.5 CAL PO LIQD
1000.0000 mL | ORAL | Status: DC
  Administered 2020-10-09 – 2020-10-12 (×4): 1000 mL
  Filled 2020-10-09 (×7): qty 1000

## 2020-10-09 NOTE — Evaluation (Signed)
Physical Therapy Evaluation Patient Details Name: Devin [name] MRN: [id number] DOB: [birth year] Today's Date: 10/09/2020   History of Present Illness  Pt is a 80 yo male s/p recently been hospitalized from 4/3-4/29.  Transferred to CIR 4/29 and came back to [facility] Acute care for low blood sugars. Pt has had difficulty with cortak placement. On recent admit, pt with SOB after fall on 3/31 which resulted in T11 Chance fracture as well as stable L1 compression fx.  While in the ED patient required intubation acute on chronic hypercarbic respiratory failure.  Neurosurgery performed posterior fusion of T11-T12 on 4/8. Trach placed 4/12. Rectal tube placement from Crohn's dx. Transferred to CIR 4/29.  Clinical Impression  Pt admitted with above diagnosis. Pt was able to stand to RW and pivot to chair with VSS on 60% FiO2 trach collar with sats >90%.  Pt somewhat fatigued and flat affect today.  Needed total assist to don brace.  Pt performed exercise in sitting once in chair.  Pt is a great Rehab candidate and pt and family anxious for pt to return to Rehab.  Pt currently with functional limitations due to the deficits listed below (see PT Problem List). Pt will benefit from skilled PT to increase their independence and safety with mobility to allow discharge to the venue listed below.      Follow Up Recommendations CIR    Equipment Recommendations  3in1 (PT);Rolling walker with 5" wheels    Recommendations for Other Services Rehab consult     Precautions / Restrictions Precautions Precautions: Fall;Back Precaution Booklet Issued: No Precaution Comments: rectal tube, trach collar, cortrak, TLSO Required Braces or Orthoses: Spinal Brace Spinal Brace: Thoracolumbosacral orthotic;Applied in sitting position Restrictions Weight Bearing Restrictions: No      Mobility  Bed Mobility Overal bed mobility: Needs Assistance Bed Mobility: Rolling;Sidelying to Sit Rolling: Min assist Sidelying to sit:  Min assist;HOB elevated       General bed mobility comments: Min A for elevating trunk and modA for scooting hips to EOB    Transfers Overall transfer level: Needs assistance Equipment used: Rolling walker (2 wheeled) Transfers: Sit to/from [facility] Sit to Stand: Min assist;+2 safety/equipment Stand pivot transfers: Min assist;+2 physical assistance;+2 safety/equipment       General transfer comment: MinA +2 for power up and for taking a few steps to recliner. Pt encoueraged to ambulate more, but pt stopping at recliner  Ambulation/Gait                Stairs            Wheelchair Mobility    Modified Rankin (Stroke Patients Only)       Balance Overall balance assessment: Needs assistance Sitting-balance support: Bilateral upper extremity supported;Feet supported;Single extremity supported Sitting balance-Leahy Scale: Fair     Standing balance support: Bilateral upper extremity supported;During functional activity Standing balance-Leahy Scale: Poor Standing balance comment: Reliant on bil UE support and external assist.                             Pertinent Vitals/Pain Pain Assessment: Faces Faces Pain Scale: Hurts a little bit Pain Location: generalized; lower back with transitions Pain Descriptors / Indicators: Grimacing Pain Intervention(s): Limited activity within patient's tolerance;Monitored during session;Premedicated before session;Repositioned    Home Living Family/patient expects to be discharged to:: Other (Comment) (CIR) Living Arrangements: Spouse/significant other   Type of Home: House Home Access: Stairs to enter  Home Layout: One level Home Equipment: Walker - 2 wheels;Bedside commode Additional Comments: daughter present at time of this eval.    Prior Function Level of Independence: Independent               Hand Dominance   Dominant Hand: Right    Extremity/Trunk Assessment   Upper  Extremity Assessment Upper Extremity Assessment: Defer to OT evaluation RUE Deficits / Details: strength 3+/5 LUE Deficits / Details: strength 3+/5    Lower Extremity Assessment Lower Extremity Assessment: Generalized weakness RLE Deficits / Details: grossly 3/5 RLE Coordination: decreased gross motor LLE Deficits / Details: grossly 3/5 LLE Coordination: decreased gross motor    Cervical / Trunk Assessment Cervical / Trunk Assessment: Other exceptions Cervical / Trunk Exceptions: s/p back sx  Communication   Communication: Tracheostomy  Cognition Arousal/Alertness: Awake/alert Behavior During Therapy: WFL for tasks assessed/performed Overall Cognitive Status: Difficult to assess Area of Impairment: Memory;Following commands                     Memory: Decreased recall of precautions Following Commands: Follows one step commands with increased time       General Comments: Pt requiring increased time and fatigue. Pt following commands, but reports pain with movement.      General Comments General comments (skin integrity, edema, etc.): Pt on 60% O2 trach collar adn O2 >90% throughout. VSS    Exercises General Exercises - Lower Extremity Ankle Circles/Pumps: AROM;Both;10 reps;Seated Long Arc Quad: Both;Seated;10 reps;AROM Hip Flexion/Marching: AROM;Strengthening;Both;10 reps;Seated   Assessment/Plan    PT Assessment Patient needs continued PT services  PT Problem List Decreased strength;Decreased activity tolerance;Decreased balance;Decreased mobility;Pain;Decreased range of motion;Decreased coordination;Decreased knowledge of use of DME;Decreased safety awareness;Decreased knowledge of precautions;Decreased skin integrity       PT Treatment Interventions Gait training;DME instruction;Functional mobility training;Therapeutic activities;Therapeutic exercise;Balance training;Patient/family education;Stair training;Neuromuscular re-education    PT Goals (Current  goals can be found in the Care Plan section)  Acute Rehab PT Goals Patient Stated Goal: to walk PT Goal Formulation: With patient/family Time For Goal Achievement: 10/23/20 Potential to Achieve Goals: Good    Frequency Min 3X/week   Barriers to discharge        Co-evaluation PT/OT/SLP Co-Evaluation/Treatment: Yes Reason for Co-Treatment: Complexity of the patient's impairments (multi-system involvement);For patient/therapist safety PT goals addressed during session: Mobility/safety with mobility OT goals addressed during session: ADL's and self-care;Strengthening/ROM       AM-PAC PT "6 Clicks" Mobility  Outcome Measure Help needed turning from your back to your side while in a flat bed without using bedrails?: A Little Help needed moving from lying on your back to sitting on the side of a flat bed without using bedrails?: A Little Help needed moving to and from a bed to a chair (including a wheelchair)?: A Little Help needed standing up from a chair using your arms (e.g., wheelchair or bedside chair)?: A Little Help needed to walk in hospital room?: A Little Help needed climbing 3-5 steps with a railing? : A Lot 6 Click Score: 17    End of Session Equipment Utilized During Treatment: Oxygen;Back brace;Gait belt Activity Tolerance: Patient tolerated treatment well Patient left: with call bell/phone within reach;in chair;with chair alarm set;with family/visitor present Nurse Communication: Mobility status PT Visit Diagnosis: Pain;Difficulty in walking, not elsewhere classified (R26.2);Unsteadiness on feet (R26.81);Other abnormalities of gait and mobility (R26.89);Muscle weakness (generalized) (M62.81) Pain - Right/Left: Right Pain - part of body:  (back)    Time: 0925-1000 PT  Time Calculation (min) (ACUTE ONLY): 35 min   Charges:   PT Evaluation $PT Eval Moderate Complexity: 1 Mod          Adonte Vanriper M,PT Acute Rehab Services [phone] (pager)  Alvira Philips 10/09/2020, 12:42 PM

## 2020-10-09 NOTE — Progress Notes (Signed)
PROGRESS NOTE   GARVIN ELLENA  MWN:[id number] DOB: 1941-02-08 DOA: 10/07/2020 PCP: Prince Solian, MD  Brief Narrative:   80 year old white male A. 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 Isaias Cowman 4/27  Patient subsequently came back to acute care because of dislodged  Core track which was accidental Patient became hypoglycemic and encephalopathic--patient was placed on D5 and has improved His antibiotics have been continued given concerns for aspiration and significant secretions  Hospital-Problem based course  Dislodged Coretrack Coretrack placed--cont Feeds CBG q4 Metabolic encephalopathy Likely secondary to severe hypoglycemia Resolved at this time  I have resumed His prior to admission pain medications but have cut back his Ativan to once daily as needed (was a post ICU medication) Severe hypoglycemia on admission D/c d5-CbG q4 Resumed Nepro Prosource and Juven AKI resolved-IV fluid discontinued Watch bicarb Status post laking of rod system T7-11 + T11-L4 Outpatient neurosurgery follow-up Klebsiella pneumonia from prior to admission Concern however for superimposed aspiration pneumonia Continue vancomycin Zosyn-- check PCT in am, 2 vw CXR in am Postsurgical hypothyroidism Home dose resumed Laryngeal carcinoma Follow-up with Dr. Roseanna Rainbow in the outpatient setting Biopsy results are pending Paroxysmal A. 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   Difficult to place patient No  Consultants:   Interventional radiology Dr. Anselm Pancoast  ENT Dr. Lorin Mercy  Procedures:   Antimicrobials: Vancomycin Zosyn   Subjective: Awake coherent no distress -Daughter at the bedside  Objective: Vitals:   10/09/20 0810 10/09/20 0900 10/09/20 1215 10/09/20 1221  BP: 117/66  124/60   Pulse: 83 68 70 61  Resp:  18  18  Temp: 97.8 F (36.6 C)  98.3 F (36.8 C)   TempSrc: Oral  Oral   SpO2: 100% 99% 100% 100%  Weight:        Intake/Output Summary (Last 24 hours) at 10/09/2020 1235 Last data filed at 10/09/2020 0327 Gross per 24 hour  Intake 1851.24 ml  Output 1000 ml  Net 851.24 ml   Filed Weights   10/08/20 0432 10/09/20 0500  Weight: 91.5 kg 91.3 kg    Examination:  EOMI NCAT-motions well with hands and I can understand when he speaks despite the trach He has a core track in his right nare Power is 5/5 no icterus no pallor Chest is clear no added sound anteriorly Abdomen soft no rebound no guarding Psych euthymic pleasant  Data Reviewed: personally reviewed  FINAL MICROSCOPIC DIAGNOSIS:   A. PHARYNX, RIGHT POSTERIOR WALL, BIOPSY:  - Reactive squamous mucosa with mild chronic inflammation and focal  hemorrhage.  - No dysplasia or malignancy.  CBC    Component Value Date/Time   WBC 11.9 (H) 10/09/2020 0130   RBC 2.59 (L)  10/09/2020 0130   HGB 7.6 (L) 10/09/2020 0130   HGB 12.9 (L) 09/06/2019 0924   HGB 12.6 (L) 02/18/2017 1011   HCT 25.7 (L) 10/09/2020 0130   HCT 37.3 (L) 02/18/2017 1011   PLT 433 (H) 10/09/2020 0130   PLT 261 09/06/2019 0924   PLT 246 02/18/2017 1011   MCV 99.2 10/09/2020 0130   MCV 94.2 02/18/2017 1011   MCH 29.3 10/09/2020 0130   MCHC 29.6 (L) 10/09/2020 0130   RDW 14.1 10/09/2020 0130   RDW 13.3 02/18/2017 1011   LYMPHSABS 1.0 10/09/2020 0130   LYMPHSABS 1.6 02/18/2017 1011   MONOABS 1.2 (H) 10/09/2020 0130   MONOABS 0.6 02/18/2017 1011   EOSABS 0.1 10/09/2020 0130   EOSABS 0.2 02/18/2017 1011   BASOSABS 0.0 10/09/2020 0130   BASOSABS 0.0 02/18/2017 1011   CMP Latest Ref Rng & Units 10/09/2020 10/08/2020 10/07/2020  Glucose 70 - 99 mg/dL 159(H) 93 87  BUN 8 - 23 mg/dL 30(H) 42(H) 69(H)  Creatinine 0.61 - 1.24 mg/dL 1.00 1.00 1.20  Sodium 135 - 145 mmol/L 136 136 133(L)  Potassium 3.5 - 5.1 mmol/L 4.5 4.5 4.5  Chloride 98 - 111 mmol/L 95(L) 91(L) 89(L)  CO2 22 - 32 mmol/L 36(H) 37(H) 35(H)  Calcium 8.9 - 10.3 mg/dL 8.3(L) 8.8(L) 8.9  Total Protein 6.5 - 8.1 g/dL 5.0(L) - 5.8(L)  Total Bilirubin 0.3 - 1.2 mg/dL 0.5 - 0.4  Alkaline Phos 38 - 126 U/L 85 - 118  AST 15 - 41 U/L 17 - 15  ALT 0 - 44 U/L 11 - 11     Radiology Studies: DG Abd 1 View  Result Date: 10/07/2020 CLINICAL DATA:  NG tube placement EXAM: ABDOMEN - 1 VIEW COMPARISON:  None. FINDINGS: Enteric tube overlies the right lung base and is likely within and airway. Unremarkable bowel gas pattern. Extensive thoracolumbar fusion construct. IMPRESSION: Enteric tube is likely malposition within right lower lobe airway. These results will be called to the ordering clinician or representative by the Radiologist Assistant, and communication documented in the PACS or [employer]. Electronically Signed   By: Macy Mis M.D.   On: 10/07/2020 14:43   DG Chest Port 1 View  Result Date: 10/07/2020 CLINICAL DATA:   Aspiration pneumonia EXAM: PORTABLE CHEST 1 VIEW COMPARISON:  09/21/2020 FINDINGS: Single frontal view of the chest demonstrates stable tracheostomy tube. The enteric catheter seen previously has been removed. Cardiac silhouette is stable. Persistent bibasilar consolidation and effusions, right greater than left. No evidence of pneumothorax. No acute bony abnormalities. Stable postsurgical changes of the thoracic spine. IMPRESSION: 1. Stable bibasilar consolidation and effusions, right greater than left. Findings could reflect airspace disease, aspiration, or atelectasis. Electronically Signed   By: Randa Ngo M.D.   On: 10/07/2020 15:35   DG Abd Portable 1V  Result Date: 10/09/2020 CLINICAL DATA:  Status post nasogastric catheter placement EXAM: PORTABLE ABDOMEN - 1 VIEW COMPARISON:  None. FINDINGS: Scattered large and small bowel gas is noted. New feeding catheter is noted with the weighted tip in the distal stomach. Extensive hardware is noted overlying the thoracolumbar spine as well as extrinsic to the patient. IMPRESSION: Feeding catheter within the distal stomach. Electronically  Signed   By: Inez Catalina M.D.   On: 10/09/2020 10:32   DG Loyce Dys Tube Plc W/Fl W/Rad  Result Date: 10/08/2020 CLINICAL DATA:  Feeding tube needed due to dysphagia and tracheostomy in place. EXAM: NASO G TUBE PLACEMENT WITH FL AND WITH RAD FLUOROSCOPY TIME:  Fluoroscopy Time:  3 minutes 12 seconds 64.4 mGy COMPARISON:  None. FINDINGS: Using fluoroscopic guidance, a weighted tip feeding tube was advanced to the stomach. Stylet was removed. Tubing was taped at the nose. Patient was returned to the floor in stable condition. IMPRESSION: Successful placement of a weighted tip feeding tube into the stomach using fluoroscopic guidance. Electronically Signed   By: Franki Cabot M.D.   On: 10/08/2020 12:51   VAS [place] LOWER EXTREMITY VENOUS (DVT)  Result Date: 10/08/2020  Lower Venous DVT Study Patient Name:  WESLIE PRETLOW  Date  of Exam:   10/08/2020 Medical Rec #: [id number]          Accession #:    [id number] Date of Birth: [birth year]          Patient Gender: M Patient Age:   79Y Exam Location:  [facility] Procedure:      VAS [place] LOWER EXTREMITY VENOUS (DVT) Referring Phys: [id number] [place] --------------------------------------------------------------------------------  Indications: Immobility, rehabilitation.  Comparison Study: Prior negative left lower extremity duplex done 01/05/20 Performing Technologist: Sharion Dove RVS  Examination Guidelines: A complete evaluation includes B-mode imaging, spectral Doppler, color Doppler, and power Doppler as needed of all accessible portions of each vessel. Bilateral testing is considered an integral part of a complete examination. Limited examinations for reoccurring indications may be performed as noted. The reflux portion of the exam is performed with the patient in reverse Trendelenburg.  +---------+---------------+---------+-----------+----------+--------------+ RIGHT    CompressibilityPhasicitySpontaneityPropertiesThrombus Aging +---------+---------------+---------+-----------+----------+--------------+ CFV      Full           Yes      Yes                                 +---------+---------------+---------+-----------+----------+--------------+ SFJ      Full                                                        +---------+---------------+---------+-----------+----------+--------------+ FV Prox  Full                                                        +---------+---------------+---------+-----------+----------+--------------+ FV Mid   Full                                                        +---------+---------------+---------+-----------+----------+--------------+ FV DistalFull                                                        +---------+---------------+---------+-----------+----------+--------------+  PFV      Full                                                         +---------+---------------+---------+-----------+----------+--------------+ POP      Full           Yes      Yes                                 +---------+---------------+---------+-----------+----------+--------------+ PTV      Full                                                        +---------+---------------+---------+-----------+----------+--------------+ PERO     Full                                                        +---------+---------------+---------+-----------+----------+--------------+   +---------+---------------+---------+-----------+----------+--------------+ LEFT     CompressibilityPhasicitySpontaneityPropertiesThrombus Aging +---------+---------------+---------+-----------+----------+--------------+ CFV      Full           Yes      Yes                                 +---------+---------------+---------+-----------+----------+--------------+ SFJ      Full                                                        +---------+---------------+---------+-----------+----------+--------------+ FV Prox  Full                                                        +---------+---------------+---------+-----------+----------+--------------+ FV Mid   Full                                                        +---------+---------------+---------+-----------+----------+--------------+ FV DistalFull                                                        +---------+---------------+---------+-----------+----------+--------------+ PFV      Full                                                        +---------+---------------+---------+-----------+----------+--------------+  POP      Full           Yes      Yes                                 +---------+---------------+---------+-----------+----------+--------------+ PTV      Full                                                         +---------+---------------+---------+-----------+----------+--------------+ PERO     Full                                                        +---------+---------------+---------+-----------+----------+--------------+     Summary: BILATERAL: - No evidence of deep vein thrombosis seen in the lower extremities, bilaterally. -   *See table(s) above for measurements and observations. Electronically signed by Harold Barban MD on 10/08/2020 at 4:25:43 PM.    Final      Scheduled Meds: . acetaminophen (TYLENOL) oral liquid 160 mg/5 mL  500 mg Per Tube Q6H  . acidophilus  2 capsule Oral TID  . ALPRAZolam  0.5 mg Oral Daily  . apixaban  5 mg Per Tube BID  . atenolol  25 mg Per Tube BID  . budesonide (PULMICORT) nebulizer solution  0.5 mg Nebulization BID  . chlorhexidine  15 mL Mouth/Throat BID  . Chlorhexidine Gluconate Cloth  6 each Topical Daily  . cholestyramine  4 g Per Tube BID AC  . diltiazem  30 mg Per Tube Q6H  . feeding supplement (PROSource TF)  45 mL Per Tube Daily  . free water  100 mL Per Tube Q8H  . Gerhardt's butt cream   Topical BID  . levothyroxine  125 mcg Per Tube QAC breakfast  . loperamide HCl  4 mg Per Tube QID  . mouth rinse  15 mL Mouth Rinse 10 times per day  . nutrition supplement (JUVEN)  1 packet Per Tube BID BM   Continuous Infusions: . dextrose 5 % and 0.45% NaCl Stopped (10/08/20 1303)  . feeding supplement (NEPRO CARB STEADY) 1,000 mL (10/08/20 1738)  . ferric gluconate (FERRLECIT/NULECIT) IV Stopped (10/08/20 1114)  . piperacillin-tazobactam (ZOSYN)  IV 3.375 g (10/09/20 0915)  . vancomycin 1,750 mg (10/08/20 1943)     LOS: 2 days   Time spent: 75 minutes  Greater than 30 minutes of time was spent in care coordination with ENT and radiology  Nita Sells, MD [facility] To contact the attending provider between 7A-7P or the covering provider during after hours 7P-7A, please log into the web site [email or web address] and access using  universal [place] password for that web site. If you do not have the password, please call the hospital operator.  10/09/2020, 12:35 PM

## 2020-10-09 NOTE — Plan of Care (Signed)
  Problem: Clinical Measurements: Goal: Respiratory complications will improve Outcome: Progressing   Problem: Clinical Measurements: Goal: Cardiovascular complication will be avoided Outcome: Progressing   Problem: Nutrition: Goal: Adequate nutrition will be maintained Outcome: Progressing   Problem: Safety: Goal: Ability to remain free from injury will improve Outcome: Progressing

## 2020-10-09 NOTE — Progress Notes (Signed)
Inpatient Rehabilitation Admissions Coordinator  I met with patient, his daughter, [name] and SLP at bedside. I am very familiar with pateint for I admitted him to [facility] 4/29. They do wish to readmit to CIR. I will begin insurance reauthorization for a possible readmit today once all therapy evals are in . Patient noticeably frustrated with his current condition.   Danne Baxter, RN, MSN Rehab Admissions Coordinator [phone] 10/09/2020 11:02 AM

## 2020-10-09 NOTE — Evaluation (Signed)
[facility] Speaking Valve - Evaluation Patient Details  Name: Devin [name] MRN: [id number] Date of Birth: November 12, 1940  Today's Date: 10/09/2020 Time: 1030-1055 SLP Time Calculation (min) (ACUTE ONLY): 25 min  Past Medical History:  Past Medical History:  Diagnosis Date  . A-fib ([place])   . Adenocarcinoma of left lung, stage 1 ([place]) 03/10/2015  . Ankylosing spondylitis ([place])    Diagnosed during lumbar fracture summer of 2016    . Crohn's disease ([place])   . Gout   . HIstory of basal cell cancer of face    THYROID CA HX  . History of kidney stones   . Hypertension   . Hypothyroidism   . Impotence   . 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 Obesity (BMI 30-39.9)   . Osteoporosis    Pt completed 5 years of fosamax in 2013     . Prostate cancer with recurrence    Treated with prostatectomy with recurrence 2012 with Lupron treatment now him   . Psoriasis   . Thyroid cancer ([place]) 1999   Treated with RAI and total thyroidectomy   . Type II diabetes mellitus ([place])    Past Surgical History:  Past Surgical History:  Procedure Laterality Date  . ABDOMINAL EXPLORATION SURGERY     for small bowel obstruction  . APPENDECTOMY  03/2007  . BACK SURGERY    . BASAL CELL CARCINOMA EXCISION  "several"   "head"  . CARDIAC CATHETERIZATION  03/17/2003  . CHOLECYSTECTOMY N/A 05/07/2016   Procedure: LAPAROSCOPIC CHOLECYSTECTOMY;  Surgeon: Fanny Skates, MD;  Location: [place];  Service: General;  Laterality: N/A;  . COLON SURGERY  03/2007   Resection of cecum, appendix, terminal ileum (approximately/notes 10/10/2010  . CYSTOSCOPY/URETEROSCOPY/HOLMIUM LASER/STENT PLACEMENT Right 07/03/2020   Procedure: CYSTOSCOPY/RETROGRADE/URETEROSCOPY REMOVAL OF BLADDER STONE;  Surgeon: Raynelle Bring, MD;  Location: [facility];  Service: Urology;  Laterality: Right;  . DIRECT LARYNGOSCOPY N/A 10/04/2020   Procedure: DIRECT LARYNGOSCOPY, PHARYNGOSCOPY WITH BIOPSY;  Surgeon: [name],  DO;  Location: [place];  Service: ENT;  Laterality: N/A;  . ESOPHAGOSCOPY N/A 10/04/2020   Procedure: ESOPHAGOSCOPY WITH BIOPSY;  Surgeon: [name], DO;  Location: [place];  Service: ENT;  Laterality: N/A;  . HERNIA REPAIR    . LAMINECTOMY WITH POSTERIOR LATERAL ARTHRODESIS LEVEL 1 N/A 09/15/2020   Procedure: REVISION OF THORACOLUMBAR FUSION, ADDITION OF CROSS-CONNECTORS;  Surgeon: Eustace Moore, MD;  Location: [place];  Service: Neurosurgery;  Laterality: N/A;  . LAPAROSCOPIC CHOLECYSTECTOMY  05/07/2016  . LAPAROSCOPIC LYSIS OF ADHESIONS  05/07/2016  . LAPAROSCOPIC LYSIS OF ADHESIONS N/A 05/07/2016   Procedure: LAPAROSCOPIC LYSIS OF ADHESIONS TIMES ONE HOUR;  Surgeon: Fanny Skates, MD;  Location: [place];  Service: General;  Laterality: N/A;  . POSTERIOR FUSION THORACIC SPINE  02/08/2016   1. Posterior thoracic arthrodesis T7-T11 utilizing morcellized allograft, 2. Posterior thoracic segmental fixation T7-T11 utilizing nuvasive pedicle screws  . PROSTATECTOMY  06/2001   w/bilateral pelvic lymph nose dissection/notes 10/24/2010  . SPINAL FUSION  12/2014   Open reduction internal fixation of L1 Chance fracture with posterior fusion T10-L4 utilizing morcellized allograft and some local autograft, segmental instrumentation T10-L4 inclusive utilizing nuvasive pedicle screws/notes 12/16/2014  . Stress Cardiolite  02/17/2003  . THOROCOTOMY WITH LOBECTOMY  03/16/2015   Procedure: THOROCOTOMY WITH LOBECTOMY;  Surgeon: Ivin Poot, MD;  Location: [place];  Service: Thoracic;;  . TONSILLECTOMY    . TOTAL THYROIDECTOMY  1997  . Venous Doppler  05/30/2004  .  VENTRAL HERNIA REPAIR  04/14/2008  . VIDEO ASSISTED THORACOSCOPY Left 03/16/2015   Procedure: VIDEO ASSISTED THORACOSCOPY;  Surgeon: Ivin Poot, MD;  [facility];  Service: Thoracic;  Laterality: Left;   HPI:  80 yo male admitted s/p fall 3/31 and obtained T11 chance fracture, stable L1 compression fracture, extensive lumbar spondylosis and  postsurgical changes, left 7th rib fx anteriorly, left pleural effusion. Intubated 4/3 and 4/8 s/p rod connection for T11 fx. 4/9 failed attempt extubation. Trach on 4/12 trach placement. Pt seen by SLP during acute admission, Noted to have mass on posterior pharyngeal wall, biopsy pending. D/c to CIR on 4/28, but pt pulled cortrak and became hypoglycemic, readmitted to acute. Has remained NPO.  PMH restrictive lung disease, emphysema/COPD adenocarcinoma s/p LUL lobectomy Crohn's, Psoriasis, Afib, nephrolithiasis, HTN, DM, obesity, thyroid cancer s/p RAI and total thyroidectomy (1999), and prostate cancer s/p prostatectomy (2012) but with recurrence   Assessment / Plan / Recommendation Clinical Impression  Pt demonstrates stable tolerance of PMSV placement today after readmission to acute care. Pt upright in chair, less communicative per family and staff than seen in prior days. Pt still with cuffed trach, cuff deflated at baseline. With PMSV placement pt immediate able to clear upper airway secretions with cued throat clear. Also achieved soft phonation. Needed 100% verbal cue to initaite verbal response to Y/N and open ended questions throughout session. However with encouragement, pt able to extend responses to short phrases or sentences to make requests. Pt oriented only to self initially, but able to repeat orientation to place and simple situation after teaching and a 3 minute delay. Proveided teaching to daughter on safe placement of PMSV. Pt to wear PMSV with full staff or trained family supervision. Attempted swallow eval, pt not very insterested. Senses pharyngeal dysaphgia and need for mulitple swallows and doesnt want more. Also addressed pt awareness of medical situation with visual feed back. Will continue efforts. SLP Visit Diagnosis: Aphonia (R49.1)    SLP Assessment  Patient needs continued Speech Lanaguage Pathology Services    Follow Up Recommendations  Inpatient Rehab    Frequency  and Duration min 2x/week  2 weeks    PMSV Trial PMSV was placed for: 15 mintues Able to redirect subglottic air through upper airway: Yes Able to Attain Phonation: Yes Voice Quality: Low vocal intensity;Breathy Able to Expectorate Secretions: Yes Level of Secretion Expectoration with PMSV: Oral Breath Support for Phonation: Mildly decreased Intelligibility: Intelligible Word: 75-100% accurate Phrase: 75-100% accurate Sentence: 75-100% accurate Conversation: Not tested SpO2 During Trial: 97 % Pulse During Trial: 70   Tracheostomy Tube  Additional Tracheostomy Tube Assessment Trach Collar Period: all waking hours Level of Secretion Expectoration: Oral    Vent Dependency  Vent Dependent: No FiO2 (%): 60 %    Cuff Deflation Trial  GO Tolerated Cuff Deflation:  (baseline)        Ellayna Hilligoss, Katherene Ponto 10/09/2020, 11:10 AM

## 2020-10-09 NOTE — Progress Notes (Signed)
Cortrak Tube Team Note:  Consult received to place a Cortrak feeding tube.   Upon arrival, pt with 10 french NG in right nare and secured with tape. Per Devin Harms RN, tube was placed yesterday by IR. At this time, plan to utilize current tube, will not replace with Cortrak. Tube bridled in place at 72 cm by this RD.  Of note, Devin [name] indicated that no LDA was opened yesterday after tube placed, no documentation of cm marking in chart and no note from IR with regards to tube placement. Only documentation of tube being placed was actually xray. Nepro TF infusing at this time.  Will need to obtain abd xray to confirm placement and then place LDA. TF placed on hold until xray confirmation. If tube needs to be adjusted, plan to follow-up  Devin Passey MS, RDN, LDN, CNSC Registered Dietitian III Clinical Nutrition RD Pager and On-Call Pager Number Located in [place]

## 2020-10-09 NOTE — Evaluation (Signed)
Occupational Therapy Evaluation Patient Details Name: Devin [name] MRN: [id number] DOB: [birth year] Today's Date: 10/09/2020    History of Present Illness Pt is a 80 yo male s/p recently been hospitalized from 4/3-4/29 after presenting in excruciating pain and SOB after recently being found to have T11 Chance fracture on 4/1 that likely resulted from fall that occurred on 3/31.  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  Neurosurgery performed posterior fusion of T11-T12 on 4/8. Trach placed 4/12. Rectal tube placement from Crohn's dx. Transferred to CIR 4/29 and came back to [facility] for low blood sugars. Pt has had difficulty with cortak placement.   Clinical Impression   Pt PTA: pt recent re-admit from CIR and had not been able to progress with the therapies. Pt currently, minA +2 for transfers and sit to stands from bed, but only taking a few steps for transfer. Pt limited by decreased strength, endurance for transferring to recliner and poor ability to care for self without extensive assist. Pt minA to totalA for ADL and very limited by pain and discomfort. Pt and family appears motivated to return to PLOF. Pt on 60% O2 trach collar and O2 >90% throughout. VSS. Pt would greatly benefit from continued OT skilled services. OT following acutely.    Follow Up Recommendations  CIR    Equipment Recommendations  3 in 1 bedside commode    Recommendations for Other Services Rehab consult     Precautions / Restrictions Precautions Precautions: Fall;Back Precaution Booklet Issued: No Precaution Comments: rectal tube, trach collar, cortrak, TLSO Required Braces or Orthoses: Spinal Brace Spinal Brace: Thoracolumbosacral orthotic;Applied in sitting position Restrictions Weight Bearing Restrictions: No      Mobility Bed Mobility Overal bed mobility: Needs Assistance Bed Mobility: Rolling;Sidelying to Sit Rolling: Min assist Sidelying to  sit: Min assist;HOB elevated       General bed mobility comments: Min A for elevating trunk and modA for scooting hips to EOB    Transfers Overall transfer level: Needs assistance Equipment used: Rolling walker (2 wheeled) Transfers: Sit to/from [facility] Sit to Stand: Min assist;+2 safety/equipment Stand pivot transfers: Min assist;+2 physical assistance;+2 safety/equipment       General transfer comment: MinA +2 for power up and for taking a few steps to recliner. Pt encoueraged to ambulate more, but pt stopping at recliner    Balance Overall balance assessment: Needs assistance Sitting-balance support: Bilateral upper extremity supported;Feet supported;Single extremity supported Sitting balance-Leahy Scale: Fair     Standing balance support: Bilateral upper extremity supported;During functional activity Standing balance-Leahy Scale: Poor Standing balance comment: Reliant on bil UE support and external assist.                           ADL either performed or assessed with clinical judgement   ADL Overall ADL's : Needs assistance/impaired Eating/Feeding: NPO   Grooming: Minimal assistance;Sitting   Upper Body Bathing: Moderate assistance;Sitting   Lower Body Bathing: Maximal assistance;Sitting/lateral leans;Bed level;Cueing for safety   Upper Body Dressing : Maximal assistance;Sitting Upper Body Dressing Details (indicate cue type and reason): Pt partially donning brace. Requiring Max A for positioning and stabilizing brace Lower Body Dressing: Maximal assistance;Cueing for safety;Sitting/lateral leans;Sit to/from stand   Toilet Transfer: Minimal assistance;+2 for safety/equipment;Ambulation;RW Toilet Transfer Details (indicate cue type and reason): MinA for support and for line management. Toileting- Clothing Manipulation and Hygiene: Total assistance;Sitting/lateral lean;Sit to/from stand Toileting - Clothing  Manipulation Details  (indicate cue type and reason): has catheter and rectal tube     Functional mobility during ADLs: +2 for physical assistance;Minimal assistance;Cueing for safety;Rolling walker General ADL Comments: Pt limited by decreased strength, endurance for transferring to recliner and poor ability to care for self without extensive assist.     Vision Baseline Vision/History: No visual deficits Patient Visual Report: No change from baseline Vision Assessment?: No apparent visual deficits     Perception     Praxis      Pertinent Vitals/Pain Pain Assessment: Faces Faces Pain Scale: Hurts little more Pain Location: generalized; lower back with transitions Pain Descriptors / Indicators: Sore;Grimacing;Discomfort Pain Intervention(s): Monitored during session     Hand Dominance Right   Extremity/Trunk Assessment Upper Extremity Assessment Upper Extremity Assessment: Generalized weakness RUE Deficits / Details: strength 3+/5 LUE Deficits / Details: strength 3+/5   Lower Extremity Assessment Lower Extremity Assessment: Generalized weakness;Defer to PT evaluation   Cervical / Trunk Assessment Cervical / Trunk Assessment: Other exceptions Cervical / Trunk Exceptions: s/p back sx   Communication Communication Communication: Tracheostomy   Cognition Arousal/Alertness: Awake/alert Behavior During Therapy: WFL for tasks assessed/performed Overall Cognitive Status: Difficult to assess Area of Impairment: Memory;Following commands                     Memory: Decreased recall of precautions Following Commands: Follows one step commands with increased time       General Comments: Pt requiring increased time and fatigue. Pt following commands, but reports pain with movement.   General Comments  Pt on 60% O2 trach collar adn O2 >90% throughout. VSS    Exercises     Shoulder Instructions      Home Living Family/patient expects to be discharged to:: Other (Comment)  (CIR) Living Arrangements: Spouse/significant other   Type of Home: House Home Access: Stairs to enter     Home Layout: One level     Bathroom Shower/Tub: Occupational psychologist: Handicapped height Bathroom Accessibility: Yes       Additional Comments: daughter present at time of this eval.  Lives With: Spouse    Prior Functioning/Environment Level of Independence: Independent                 OT Problem List: Decreased strength;Decreased activity tolerance;Impaired balance (sitting and/or standing);Decreased safety awareness;Decreased knowledge of use of DME or AE;Decreased knowledge of precautions;Obesity;Impaired UE functional use;Pain;Cardiopulmonary status limiting activity      OT Treatment/Interventions: Self-care/ADL training;Therapeutic exercise;Neuromuscular education;Energy conservation;DME and/or AE instruction;Manual therapy;Cognitive remediation/compensation;Therapeutic activities;Patient/family education;Balance training    OT Goals(Current goals can be found in the care plan section) Acute Rehab OT Goals Patient Stated Goal: to walk OT Goal Formulation: With patient/family Time For Goal Achievement: 10/23/20 Potential to Achieve Goals: Good ADL Goals Pt Will Perform Grooming: with set-up;sitting Pt Will Perform Lower Body Dressing: with min assist;sit to/from stand Pt Will Transfer to Toilet: with min guard assist;stand pivot transfer Additional ADL Goal #1: Pt will perform bed mobility with minguardA as precursor for ADL. Additional ADL Goal #2: Pt will don brace with minA as precursor for ADL.  OT Frequency: Min 2X/week   Barriers to D/C:            Co-evaluation PT/OT/SLP Co-Evaluation/Treatment: Yes Reason for Co-Treatment: Complexity of the patient's impairments (multi-system involvement);To address functional/ADL transfers   OT goals addressed during session: ADL's and self-care;Strengthening/ROM      AM-PAC OT "6 Clicks"  Daily Activity  Outcome Measure Help from another person eating meals?: Total Help from another person taking care of personal grooming?: A Lot Help from another person toileting, which includes using toliet, bedpan, or urinal?: A Lot Help from another person bathing (including washing, rinsing, drying)?: A Lot Help from another person to put on and taking off regular upper body clothing?: A Lot Help from another person to put on and taking off regular lower body clothing?: A Lot 6 Click Score: 11   End of Session Equipment Utilized During Treatment: Oxygen;Back brace;Rolling walker;Gait belt Nurse Communication: Mobility status;Precautions  Activity Tolerance: Patient limited by fatigue Patient left: with call bell/phone within reach;in chair;with chair alarm set;with family/visitor present  OT Visit Diagnosis: Unsteadiness on feet (R26.81);Muscle weakness (generalized) (M62.81)                Time: 0930-1000 OT Time Calculation (min): 30 min Charges:  OT General Charges $OT Visit: 1 Visit OT Evaluation $OT Eval Moderate Complexity: 1 Mod  Jefferey Pica, OTR/L Acute Rehabilitation Services Pager: [phone] Office: [place] 10/09/2020, 10:39 AM

## 2020-10-09 NOTE — Progress Notes (Signed)
Initial Nutrition Assessment  DOCUMENTATION CODES:   Non-severe (moderate) malnutrition in context of chronic illness  INTERVENTION:  Continue TF via NGT: -Transition to Glucerna 1.5 @ 19m/hr (13267md) -4547mrosource TF daily -170m36mee water Q4H  TF provides 2020 kcals, 120g protein, 1002ml39me water (2022ml 43ml free water with flushes)  Continue Juven BID via tube  NUTRITION DIAGNOSIS:   Moderate Malnutrition related to chronic illness as evidenced by mild fat depletion,mild muscle depletion,moderate muscle depletion,severe muscle depletion.  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 Note pt's hospital course has been complicated by diarrhea, afib, klebsiella PNA and soft tissue mass at the supraglottic larynx (biopsied 4/27, results pending).  MD notes pt w/ improvement since being placed on D5.   RD picked up pt after reviewed tube feeding report. Note pt was re-admitted to 2W from CIR after having pulled out his Cortrak. Discussed pt with RN, MD, and pt's daughter. Pt was previously receiving Nepro @ 50ml/h39mth 45ml Pr20mrce TF daily and 100ml fre14mter Q8H via Cortrak and was tolerating TF well until he became agitated over the weekend (cause not yet determined) and pulled out his Cortrak. Multiple attempts were made to place NGT tubes; however, pt ended up having to go to IR yesterday for NGT placement. Tube was  placed by IR team and taped into place, but no centimeter marking was documented. Cortrak team replaced tape with bridle today, 5/2, and RD ordered abdominal xray to confirm feeding tube placement prior to beginning feeds again. Tip is noted to be in the distal stomach per xray. RD will transition pt to different TF to help stabilize blood sugar. Additionally, pt no longer needs nepro as potassium is WNL. Per pt's daughter, current thought is that pt will return to CIR tomorrow.   UOP: 2.7L x24 hours  Medications: SSI Q4H,imodium, juven BID Labs reviewed.  CBGs [phone]  NUTRITION - FOCUSED PHYSICAL EXAM:  Flowsheet Row Most Recent Value  Orbital Region No depletion  Upper Arm Region Mild depletion  Thoracic and Lumbar Region Mild depletion  Buccal Region Mild depletion  Temple Region Moderate depletion  Clavicle Bone Region Severe depletion  Clavicle and Acromion Bone Region Severe depletion  Scapular Bone Region Severe depletion  Dorsal Hand Moderate depletion  Patellar Region Moderate depletion  Anterior Thigh Region Moderate depletion  Posterior Calf Region Moderate depletion  Edema (RD Assessment) Moderate  Hair Other (Comment)  [thinning]  Eyes Reviewed  Mouth Reviewed  Skin Other (Comment)  [dry, flaking]  Nails Reviewed       Diet Order:   Diet Order            Diet NPO time specified  Diet effective now                 EDUCATION NEEDS:   No education needs have been identified at this time  Skin:  Skin Assessment: Skin Integrity Issues: Skin Integrity Issues:: Stage I Stage  I: sacrum, mid lower back Other: skin tears to arms  Last BM:  4/30 via rectal tube  Height:   Ht Readings from Last 1 Encounters:  09/10/20 5' 9.02" (1.753 m)    Weight:   Wt Readings from Last 1 Encounters:  10/09/20 91.3 kg    BMI:  Body mass index is 29.71 kg/m.  Estimated Nutritional Needs:   Kcal:  2000-2200  Protein:  100-125 grams  Fluid:   >2L/d    Larkin Ina, MS, RD, LDN RD pager number and weekend/on-call pager number located in [place].

## 2020-10-10 ENCOUNTER — Inpatient Hospital Stay (HOSPITAL_COMMUNITY): Payer: Medicare Other

## 2020-10-10 DIAGNOSIS — E162 Hypoglycemia, unspecified: Secondary | ICD-10-CM

## 2020-10-10 DIAGNOSIS — T17908A Unspecified foreign body in respiratory tract, part unspecified causing other injury, initial encounter: Secondary | ICD-10-CM

## 2020-10-10 DIAGNOSIS — J9503 Malfunction of tracheostomy stoma: Secondary | ICD-10-CM | POA: Diagnosis not present

## 2020-10-10 LAB — COMPREHENSIVE METABOLIC PANEL
ALT: 10 U/L (ref 0–44)
AST: 13 U/L — ABNORMAL LOW (ref 15–41)
Albumin: 2 g/dL — ABNORMAL LOW (ref 3.5–5.0)
Alkaline Phosphatase: 91 U/L (ref 38–126)
Anion gap: 6 (ref 5–15)
BUN: 32 mg/dL — ABNORMAL HIGH (ref 8–23)
CO2: 34 mmol/L — ABNORMAL HIGH (ref 22–32)
Calcium: 8.5 mg/dL — ABNORMAL LOW (ref 8.9–10.3)
Chloride: 94 mmol/L — ABNORMAL LOW (ref 98–111)
Creatinine, Ser: 1.11 mg/dL (ref 0.61–1.24)
GFR, Estimated: >60 mL/min (ref >60)
Glucose, Bld: 132 mg/dL — ABNORMAL HIGH (ref 70–99)
Potassium: 4.3 mmol/L (ref 3.5–5.1)
Sodium: 134 mmol/L — ABNORMAL LOW (ref 135–145)
Total Bilirubin: 0.5 mg/dL (ref 0.3–1.2)
Total Protein: 5.1 g/dL — ABNORMAL LOW (ref 6.5–8.1)

## 2020-10-10 LAB — CBC WITH DIFFERENTIAL/PLATELET
Abs Immature Granulocytes: 0.4 10*3/uL — ABNORMAL HIGH (ref 0.00–0.07)
Basophils Absolute: 0 10*3/uL (ref 0.0–0.1)
Basophils Relative: 0 %
Eosinophils Absolute: 0.4 10*3/uL (ref 0.0–0.5)
Eosinophils Relative: 2 %
HCT: 27.4 % — ABNORMAL LOW (ref 39.0–52.0)
Hemoglobin: 8.2 g/dL — ABNORMAL LOW (ref 13.0–17.0)
Immature Granulocytes: 3 %
Lymphocytes Relative: 8 %
Lymphs Abs: 1.1 10*3/uL (ref 0.7–4.0)
MCH: 29.6 pg (ref 26.0–34.0)
MCHC: 29.9 g/dL — ABNORMAL LOW (ref 30.0–36.0)
MCV: 98.9 fL (ref 80.0–100.0)
Monocytes Absolute: 1.2 10*3/uL — ABNORMAL HIGH (ref 0.1–1.0)
Monocytes Relative: 8 %
Neutro Abs: 11.4 10*3/uL — ABNORMAL HIGH (ref 1.7–7.7)
Neutrophils Relative %: 79 %
Platelets: 454 10*3/uL — ABNORMAL HIGH (ref 150–400)
RBC: 2.77 MIL/uL — ABNORMAL LOW (ref 4.22–5.81)
RDW: 14.3 % (ref 11.5–15.5)
WBC: 14.5 10*3/uL — ABNORMAL HIGH (ref 4.0–10.5)
nRBC: 0 % (ref 0.0–0.2)

## 2020-10-10 LAB — GLUCOSE, CAPILLARY
Glucose-Capillary: 115 mg/dL — ABNORMAL HIGH (ref 70–99)
Glucose-Capillary: 118 mg/dL — ABNORMAL HIGH (ref 70–99)
Glucose-Capillary: 126 mg/dL — ABNORMAL HIGH (ref 70–99)
Glucose-Capillary: 126 mg/dL — ABNORMAL HIGH (ref 70–99)
Glucose-Capillary: 143 mg/dL — ABNORMAL HIGH (ref 70–99)
Glucose-Capillary: 147 mg/dL — ABNORMAL HIGH (ref 70–99)

## 2020-10-10 LAB — PROCALCITONIN: Procalcitonin: 2.9 ng/mL

## 2020-10-10 MED ORDER — DILTIAZEM HCL ER 60 MG PO CP12
60.0000 mg | ORAL_CAPSULE | Freq: Two times a day (BID) | ORAL | Status: DC
  Filled 2020-10-10: qty 1

## 2020-10-10 MED ORDER — FLORANEX PO PACK
1.0000 g | PACK | Freq: Three times a day (TID) | ORAL | Status: DC
  Administered 2020-10-11 – 2020-10-12 (×5): 1 g
  Filled 2020-10-10 (×7): qty 1

## 2020-10-10 MED ORDER — DILTIAZEM 12 MG/ML ORAL SUSPENSION
30.0000 mg | Freq: Three times a day (TID) | ORAL | Status: DC
  Administered 2020-10-11 – 2020-10-12 (×5): 30 mg via ORAL
  Filled 2020-10-10 (×8): qty 3

## 2020-10-10 MED ORDER — ALPRAZOLAM 0.5 MG PO TABS
0.5000 mg | ORAL_TABLET | Freq: Two times a day (BID) | ORAL | Status: DC
  Administered 2020-10-10 – 2020-10-12 (×4): 0.5 mg
  Filled 2020-10-10 (×4): qty 1

## 2020-10-10 MED ORDER — ALPRAZOLAM 0.25 MG PO TABS
0.2500 mg | ORAL_TABLET | Freq: Once | ORAL | Status: AC
  Administered 2020-10-10: 0.25 mg via ORAL
  Filled 2020-10-10: qty 1

## 2020-10-10 MED ORDER — IOHEXOL 300 MG/ML  SOLN
75.0000 mL | Freq: Once | INTRAMUSCULAR | Status: AC | PRN
  Administered 2020-10-10: 75 mL via INTRAVENOUS

## 2020-10-10 NOTE — Consult Note (Signed)
NAME:  Devin [name], MRN:  [id number], DOB:  [birth year], LOS: 3 ADMISSION DATE:  10/07/2020, CONSULTATION DATE:  5/3REFERRING MD:   Raliegh Ip, CHIEF COMPLAINT:  Obstructed trach    History of Present Illness:  This is a 80 year old male who we had been following s/p fall resulting in T11/12 fracture requiring requiring fusion and complicated by prolonged mechanical ventilation and failed extubation attempt x 2 requiring trach. He had been dc'd to rehab. Re-admitted to hospital  after cor trak dislodged and had aspiration PNA c/b hypoglycemia and delirium.  Critical care emergently consulted 2/2 trach obstruction on 5/3 Pertinent  Medical History  afib on chronic ac, DM type II, prior lobectomy (LUL), chron's disease, thyroid cancer, prostate cancer, T11-T12 fracture, required posterior fusion, failed extubation 2/2 upper-airway obstruction. Required trach. Went to rehab.   Recent soft tissue mass at level of supraglottic larynx->neg for malignancy (this was incidentally identified initially on MBS showing thickening prevertebral tissue w/ CT head/neck showing prevertebral soft tissue mass concerning for malignancy   Significant Hospital Events: Including procedures, antibiotic start and stop dates in addition to other pertinent events    4/29 admitted for cortrak malposition, aspiration and encephalopathy  5/3 PCCM emergently consulted for inability to suction and upper airway obstruction Had sig increase of inflammatory tissue on posterior wall of trachea. Was able to bypass with distal XLT. Still has some intermittent tracheomalacia w/ dynamic collapse of airway noted w/ cough   Interim History / Subjective:  Better after trach replaced.   Objective   Blood pressure (Abnormal) 130/44, pulse 72, temperature 98.5 F (36.9 C), temperature source Oral, resp. rate 18, weight 91.3 kg, SpO2 94 %.    FiO2 (%):  [40 %-60 %] 40 %   Intake/Output Summary (Last 24 hours) at 10/10/2020 0954 Last  data filed at 10/10/2020 0300 Gross per 24 hour  Intake 160.45 ml  Output no documentation  Net 160.45 ml   Filed Weights   10/08/20 0432 10/09/20 0500  Weight: 91.5 kg 91.3 kg    Examination: General: 80 year old white male. He is resting in bed after trach change  HENT: NCAT Now has distal 6 xlt cuffless. Some bloody secretions after change. Placement verified bronchoscopically. There was significant inflammatory granulation tissue noted on bronch Lungs: scattered rhonchi Cardiovascular: rrr Abdomen: soft not tender  Extremities: warm and dry  Neuro: awake and oriented   Labs/imaging that I havepersonally reviewed  (right click and "Reselect all SmartList Selections" daily)  See below the picture poorly demonstrates but had sig edematous friable inflamatory tissue on posterior wall of trachea. 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 I was able to get by this bye guiding it over a bronchoscope but was difficult. I think that this presents a problem for future changes  Plan CT soft  tissue re-eval subglottic stenosis CXR now Trach placement Will d/w ENT-->I think maybe formal repeat eval would be helpful. Might need look in OR and perhaps would  benefit for operative intervention assist w/ suibglottic stenosis. I might be able to get bye w/ biovona in future if XLT still a challenge to place but feel like he needs further eval from ENT first.      Best practice (right click and "Reselect all SmartList Selections" daily)  Per primary   Labs   CBC: Recent Labs  Lab 10/06/20 0151 10/07/20 0526 10/08/20 0254 10/09/20 0130 10/10/20 0357  WBC 16.8* 14.0* 16.9* 11.9* 14.5*  NEUTROABS 13.7* 11.1* 13.5* 9.3* 11.4*  HGB 7.7* 8.1* 8.4* 7.6* 8.2*  HCT 25.6* 26.4* 27.3* 25.7* 27.4*  MCV 98.5 95.0 96.5 99.2 98.9  PLT 554* 497* 520* 433* 454*    Basic Metabolic Panel: Recent Labs  Lab 10/03/20 1511 10/05/20 0236 10/06/20 0151 10/07/20 0526 10/08/20 0254 10/09/20 0130 10/10/20 0357  NA  --    < > 134* 133* 136 136 134*  K  --    < > 5.1 4.5 4.5 4.5 4.3  CL  --    < > 89* 89* 91* 95* 94*  CO2  --    < > 38* 35* 37* 36* 34*  GLUCOSE  --    < > 116* 87 93 159* 132*  BUN  --    < > 77* 69* 42* 30* 32*  CREATININE  --    < > 1.47* 1.20 1.00 1.00 1.11  CALCIUM  --    < > 8.9 8.9 8.8* 8.3* 8.5*  MG 2.1  --   --   --   --   --   --   PHOS 3.7  --   --   --   --   --   --    < > = values in this interval not displayed.   GFR: Estimated Creatinine Clearance: 62.2 mL/min (by C-G formula based on SCr of 1.11 mg/dL). Recent Labs  Lab 10/07/20 0526 10/08/20 0254 10/09/20 0130 10/09/20 1416 10/10/20 0357  PROCALCITON  --   --   --  3.71 2.90  WBC 14.0* 16.9* 11.9*  --  14.5*    Liver Function Tests: Recent Labs  Lab 10/07/20 0526 10/09/20 0130 10/10/20 0357  AST 15 17 13*  ALT 11 11 10   ALKPHOS 118 85 91  BILITOT 0.4 0.5 0.5  PROT 5.8* 5.0* 5.1*  ALBUMIN 2.4* 1.9* 2.0*   No results for input(s): LIPASE, AMYLASE in the last 168 hours. No results for input(s): AMMONIA in the last 168 hours.  ABG    Component Value Date/Time   PHART 7.570 (H) 09/10/2020 1358   PCO2ART 35.2 09/10/2020 1358   PO2ART 148 (H)  09/10/2020 1358   HCO3 32.3 (H) 09/10/2020 1358   TCO2 23.0 03/17/2015 0540   ACIDBASEDEF 3.6 (H) 03/17/2015 0540   O2SAT 98.6 09/10/2020 1358     Coagulation Profile: No results for input(s): INR, PROTIME in the last 168 hours.  Cardiac Enzymes: No results for input(s): CKTOTAL, CKMB, CKMBINDEX, TROPONINI in the last 168 hours.  HbA1C: Hgb A1c MFr Bld  Date/Time Value Ref Range Status  09/10/2020 03:33 PM 6.4 (H) 4.8 - 5.6 % Final    Comment:    (NOTE) Pre diabetes:          5.7%-6.4%  Diabetes:              >  6.4%  Glycemic control for   <7.0% adults with diabetes   07/03/2020 02:46 PM 6.2 (H) 4.8 - 5.6 % Final    Comment:    (NOTE) Pre diabetes:          5.7%-6.4%  Diabetes:              >6.4%  Glycemic control for   <7.0% adults with diabetes     CBG: Recent Labs  Lab 10/09/20 1612 10/09/20 1948 10/09/20 2301 10/10/20 0304 10/10/20 0740  GLUCAP 201* 128* 98 143* 118*    Review of Systems:   Not able   Past Medical History:  He,  has a past medical history of A-fib ([place]), Adenocarcinoma of left lung, 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   Surgical History:   Past Surgical History:  Procedure Laterality Date  . ABDOMINAL EXPLORATION SURGERY     for small bowel obstruction  . APPENDECTOMY  03/2007  . BACK SURGERY    . BASAL CELL CARCINOMA EXCISION  "several"   "head"  . CARDIAC CATHETERIZATION  03/17/2003  . CHOLECYSTECTOMY N/A 05/07/2016   Procedure: LAPAROSCOPIC CHOLECYSTECTOMY;  Surgeon: Fanny Skates, MD;  Location: [facility];  Service: General;  Laterality: N/A;  . COLON SURGERY  03/2007    Resection of cecum, appendix, terminal ileum (approximately/notes 10/10/2010  . CYSTOSCOPY/URETEROSCOPY/HOLMIUM LASER/STENT PLACEMENT Right 07/03/2020   Procedure: CYSTOSCOPY/RETROGRADE/URETEROSCOPY REMOVAL OF BLADDER STONE;  Surgeon: Raynelle Bring, MD;  Location: [facility];  Service: Urology;  Laterality: Right;  . DIRECT LARYNGOSCOPY N/A 10/04/2020   Procedure: DIRECT LARYNGOSCOPY, PHARYNGOSCOPY WITH BIOPSY;  Surgeon: [name], DO;  Location: [facility];  Service: ENT;  Laterality: N/A;  . ESOPHAGOSCOPY N/A 10/04/2020   Procedure: ESOPHAGOSCOPY WITH BIOPSY;  Surgeon: [name], DO;  Location: [facility];  Service: ENT;  Laterality: N/A;  . HERNIA REPAIR    . LAMINECTOMY WITH POSTERIOR LATERAL ARTHRODESIS LEVEL 1 N/A 09/15/2020   Procedure: REVISION OF THORACOLUMBAR FUSION, ADDITION OF CROSS-CONNECTORS;  Surgeon: Eustace Moore, MD;  Location: [facility];  Service: Neurosurgery;  Laterality: N/A;  . LAPAROSCOPIC CHOLECYSTECTOMY  05/07/2016  . LAPAROSCOPIC LYSIS OF ADHESIONS  05/07/2016  . LAPAROSCOPIC LYSIS OF ADHESIONS N/A 05/07/2016   Procedure: LAPAROSCOPIC LYSIS OF ADHESIONS TIMES ONE HOUR;  Surgeon: Fanny Skates, MD;  Location: [facility];  Service: General;  Laterality: N/A;  . POSTERIOR FUSION THORACIC SPINE  02/08/2016   1. Posterior thoracic arthrodesis T7-T11 utilizing morcellized allograft, 2. Posterior thoracic segmental fixation T7-T11 utilizing nuvasive pedicle screws  . PROSTATECTOMY  06/2001   w/bilateral pelvic lymph nose dissection/notes 10/24/2010  . SPINAL FUSION  12/2014   Open reduction internal fixation of L1 Chance fracture with posterior fusion T10-L4 utilizing morcellized allograft and some local autograft, segmental instrumentation T10-L4 inclusive utilizing nuvasive pedicle screws/notes 12/16/2014  . Stress Cardiolite  02/17/2003  . THOROCOTOMY WITH LOBECTOMY  03/16/2015   Procedure: THOROCOTOMY WITH LOBECTOMY;  Surgeon: Ivin Poot, MD;  Location: [facility];  Service:  Thoracic;;  . TONSILLECTOMY    . TOTAL THYROIDECTOMY  1997  . Venous Doppler  05/30/2004  . VENTRAL HERNIA REPAIR  04/14/2008  . VIDEO ASSISTED THORACOSCOPY Left 03/16/2015   Procedure: VIDEO ASSISTED THORACOSCOPY;  Surgeon: Tharon Aquas  Kerby Less, MD;  Location: [facility];  Service: Thoracic;  Laterality: Left;     Social History:   reports that he quit smoking about 30 years ago. His smoking use included cigarettes. He has a 60.00 pack-year smoking history. He has never used smokeless tobacco. He reports previous alcohol use. He reports that he does not use drugs.   Family History:  His family history includes CAD in his mother. There is no history of Cancer.   Allergies No Known Allergies   Home Medications  Prior to Admission medications   Medication Sig Start Date End Date Taking? Authorizing Provider  acetaminophen (TYLENOL) 160 MG/5ML solution Place 15.6 mLs (500 mg total) into feeding tube every 6 (six) hours. 10/06/20   Terrilee Croak, MD  acetylcysteine (MUCOMYST) 20 % nebulizer solution Take 3 mLs (600 mg total) by nebulization 2 (two) times daily. 10/06/20   Terrilee Croak, MD  albuterol (VENTOLIN HFA) 108 (90 Base) MCG/ACT inhaler Inhale 2 puffs into the lungs every 6 (six) hours as needed for wheezing or shortness of breath. 08/31/20   Brand Males, MD  ALPRAZolam Duanne Moron) 0.5 MG tablet Take 0.5 mg by mouth 2 (two) times daily as needed for anxiety. 02/29/20   [provider]  apixaban (ELIQUIS) 5 MG TABS tablet Take 5 mg by mouth 2 (two) times daily.    [provider]  atenolol (TENORMIN) 25 MG tablet Take 1 tablet (25 mg total) by mouth 2 (two) times daily. 02/09/20   Thurnell Lose, MD  B-D ULTRAFINE III SHORT PEN 31G X 8 MM MISC USE 1 PEN NEEDLE FOUR TIMES DAILY. 09/18/16   Ivar Drape D, PA  bethanechol (URECHOLINE) 5 MG tablet 1 tablet (5 mg total) by Per NG tube route 4 (four) times daily. 10/06/20   Dahal, Marlowe Aschoff, MD  budesonide (PULMICORT) 0.5 MG/2ML  nebulizer solution Take 2 mLs (0.5 mg total) by nebulization 2 (two) times daily. 10/06/20   Dahal, Marlowe Aschoff, MD  busPIRone (BUSPAR) 15 MG tablet Take 15 mg by mouth 2 (two) times daily.  05/17/16   [provider]  calcium-vitamin D (OSCAL WITH D) 500-200 MG-UNIT tablet Take 2 tablets by mouth daily.     [provider]  cholestyramine (QUESTRAN) 4 g packet Place 1 packet (4 g total) into feeding tube 2 (two) times daily before lunch and supper. 10/06/20   Terrilee Croak, MD  diltiazem (CARDIZEM) 10 mg/ml oral suspension Place 3 mLs (30 mg total) into feeding tube every 6 (six) hours. 10/06/20   Terrilee Croak, MD  diphenhydrAMINE (BENADRYL) 25 mg capsule 1 capsule (25 mg total) by Per NG tube route at bedtime as needed for sleep. 10/06/20   Terrilee Croak, MD  ferric gluconate 250 mg in sodium chloride 0.9 % 100 mL Inject 250 mg into the vein daily. 10/06/20   Dahal, Marlowe Aschoff, MD  fiber (NUTRISOURCE FIBER) PACK packet Place 1 packet into feeding tube 2 (two) times daily. 10/06/20   Terrilee Croak, MD  Glucose Blood (ASCENSIA CONTOUR TEST VI) Use as directed to test two times a day    [provider]  glucose blood (BAYER CONTOUR TEST) test strip TEST BLOOD SUGAR TWICE DAILY AS DIRECTED. 05/17/16   Gale Journey, Sarah L, PA-C  glucose blood test strip 1 each by Other route 2 times daily. 05/17/16   [provider]  guaiFENesin-dextromethorphan (ROBITUSSIN DM) 100-10 MG/5ML syrup Place 5 mLs into feeding tube every 8 (eight) hours. 10/06/20   Terrilee Croak, MD  insulin aspart (  NOVOLOG) 100 UNIT/ML injection Inject 0-20 Units into the skin every 4 (four) hours. 10/06/20   Dahal, Marlowe Aschoff, MD  insulin glargine (LANTUS) 100 UNIT/ML injection Inject 0.12 mLs (12 Units total) into the skin 2 (two) times daily. 10/06/20   Dahal, Marlowe Aschoff, MD  leuprolide (LUPRON) 11.25 MG injection Inject 11.25 mg into the muscle every 6 (six) months.     [provider]  levothyroxine (SYNTHROID) 125 MCG  tablet Take 1 tablet (125 mcg total) by mouth daily before breakfast. 02/10/20   Thurnell Lose, MD  loperamide HCl (IMODIUM) 1 MG/7.5ML suspension Place 15 mLs (2 mg total) into feeding tube 2 (two) times daily as needed for diarrhea or loose stools. 10/06/20   Terrilee Croak, MD  loperamide HCl (IMODIUM) 1 MG/7.5ML suspension Place 30 mLs (4 mg total) into feeding tube 4 (four) times daily. 10/06/20   Terrilee Croak, MD  Mesalamine 800 MG TBEC Take 1,600 mg by mouth 2 (two) times daily.    [provider]  methocarbamol (ROBAXIN) 500 MG tablet Take 500 mg by mouth every 8 (eight) hours. 09/08/20   [provider]  Multiple Vitamins-Iron (DAILY-VITE/IRON/BETA-CAROTENE) TABS Take 1 tablet by mouth daily.     [provider]  nutrition supplement, JUVEN, (JUVEN) PACK Place 1 packet into feeding tube 2 (two) times daily between meals. 10/06/20   Terrilee Croak, MD  Nutritional Supplements (FEEDING SUPPLEMENT, NEPRO CARB STEADY,) LIQD Place 1,000 mLs into feeding tube continuous. 10/06/20   Terrilee Croak, MD  Nutritional Supplements (FEEDING SUPPLEMENT, PROSOURCE TF,) liquid Place 45 mLs into feeding tube daily. 10/07/20   Terrilee Croak, MD  Nystatin (GERHARDT'S BUTT CREAM) CREA Apply 1 application topically 2 (two) times daily. 10/06/20   Terrilee Croak, MD  ondansetron (ZOFRAN) 4 MG/2ML SOLN injection Inject 2 mLs (4 mg total) into the vein every 6 (six) hours as needed for nausea or vomiting. 10/06/20   Dahal, Marlowe Aschoff, MD  oxyCODONE (ROXICODONE) 5 MG/5ML solution Place 5 mLs (5 mg total) into feeding tube every 4 (four) hours as needed for moderate pain. 10/06/20   Dahal, Marlowe Aschoff, MD  phenol (CHLORASEPTIC) 1.4 % LIQD Use as directed 1 spray in the mouth or throat as needed for throat irritation / pain. 10/06/20   Dahal, Marlowe Aschoff, MD  revefenacin (YUPELRI) 175 MCG/3ML nebulizer solution Take 3 mLs (175 mcg total) by nebulization daily. 10/07/20   Terrilee Croak, MD  Tiotropium Bromide Monohydrate  (SPIRIVA RESPIMAT) 2.5 MCG/ACT AERS Inhale 2 puffs into the lungs daily. 08/31/20   Brand Males, MD  Water For Irrigation, Sterile (FREE WATER) SOLN Place 100 mLs into feeding tube every 8 (eight) hours. 10/06/20   Terrilee Croak, MD  zinc gluconate 50 MG tablet Take 50 mg by mouth daily.    [provider]     Critical care time: 33 minutes.     Erick Colace ACNP-BC [place] Pager # [phone] OR # [phone] if no answer

## 2020-10-10 NOTE — Procedures (Signed)
Trach change  Emergent  Indication Distal occlusion of size 6 flexible shiley 2/2 granulomatous, inflammatory tissue   Procedure description  Initially slim bronchoscopy was advanced into trach. Immediately noticed the distal tip of trach was occluded by inflamed granulation tissue primarily on the posterior wall of trachea. Was able to get bronchoscope past the level of airway obstruction fairly easily. At that point a new distal XLT 6 was loaded on bronchoscopy and was able to advance this into the airway and manipulate the tip of the trach past the area of obstruction. This was a little difficult to advance initially as I suspect the tip of trach was getting stuck on this tissue.   Proper placement was verified via bronchoscopy, cxr and ability to easily pass sxn catheter tube as well  I ordered A repeat CT scan and will speak to ENT. I am concerned that in the future trach changes could be difficult and I wonder if he might benefit from tracheal dilation or laser debridement perhaps of this tissue if ENT thinks appropriate     The picture quality is poor but can make out the granulation tissue at tip of trach tube which was located on posterior wall and occluding the airway   Erick Colace ACNP-BC [place] Pager # [phone] OR # [phone] if no answer

## 2020-10-10 NOTE — Progress Notes (Signed)
I spoke to Dr Constance Holster w/ ent. At this point we will keep XLT in place. I will personally change the trach again in 2 weeks. We will keep the distal XLT. Often times  Bypassing the area of irritation will allow this to subside on its own. However prior to any consideration for decannulation we will have ENT re-eval as he would need to be decannulated in the OR so that the entire airway can be completely inspected   Erick Colace ACNP-BC [place] Pager # [phone] OR # [phone] if no answer

## 2020-10-10 NOTE — Progress Notes (Signed)
PT Cancellation Note  Patient Details Name: Devin [name] MRN: [id number] DOB: 07-19-1940   Cancelled Treatment:    Reason Eval/Treat Not Completed: Medical issues which prohibited therapy (MD changed trach.  Nurse states need to delay PT for a few hours. Will check back as able.)   Alvira Philips 10/10/2020, 10:08 AM Tayquan Gassman M,PT Acute Rehab Services [phone] (pager)

## 2020-10-10 NOTE — Progress Notes (Signed)
Physical Therapy Treatment Patient Details Name: Devin [name] MRN: [id number] DOB: 07-Dec-1940 Today's Date: 10/10/2020    History of Present Illness Pt is a 80 yo male s/p recently been hospitalized from 4/3-4/29.  Transferred to CIR 4/29 and came back to [facility] care for low blood sugars. Pt has had difficulty with cortak placement. On recent admit, pt with SOB after fall on 3/31 which resulted in T11 Chance fracture as well as stable L1 compression fx.  While in the ED patient required intubation acute on chronic hypercarbic respiratory failure.  Neurosurgery performed posterior fusion of T11-T12 on 4/8. Trach placed 4/12. Rectal tube placement from Crohn's dx. Transferred to CIR 4/29.    PT Comments    Patient mobilizing with less assistance, however requires incr time due to amount of medical equipment that must be managed (oxygen, cortrak, back brace, rectal tube, condom catheter, monitors). Denied dizziness with changes in position. Initially did not want to get into chair, however ultimately decided he would and play games on his phone. Making steady progress--will need +2 for ambulating.     Follow Up Recommendations  CIR     Equipment Recommendations  3in1 (PT);Rolling walker with 5" wheels    Recommendations for Other Services Rehab consult     Precautions / Restrictions Precautions Precautions: Fall;Back Precaution Booklet Issued: No Precaution Comments: rectal tube, trach collar, cortrak, TLSO Required Braces or Orthoses: Spinal Brace Spinal Brace: Thoracolumbosacral orthotic;Applied in sitting position    Mobility  Bed Mobility Overal bed mobility: Needs Assistance Bed Mobility: Rolling;Sidelying to Sit Rolling: Supervision Sidelying to sit: HOB elevated;Min guard       General bed mobility comments: pt recalled need to roll and then sit; vc to bend opposite leg to assist with rolling and maintaining back precautions    Transfers Overall transfer level:  Needs assistance Equipment used: Rolling walker (2 wheeled) Transfers: Sit to/from [employer] Transfers Sit to Stand: Min assist;From elevated surface (to simulate bed at home) Stand pivot transfers: Min guard          Ambulation/Gait             General Gait Details: will require second person for equipment management to be able to walk   Stairs             Wheelchair Mobility    Modified Rankin (Stroke Patients Only)       Balance Overall balance assessment: Needs assistance Sitting-balance support: Feet supported Sitting balance-Leahy Scale: Fair     Standing balance support: Bilateral upper extremity supported;During functional activity Standing balance-Leahy Scale: Poor Standing balance comment: Reliant on bil UE support and external assist.                            Cognition Arousal/Alertness: Awake/alert Behavior During Therapy: WFL for tasks assessed/performed Overall Cognitive Status: Difficult to assess                         Following Commands: Follows one step commands with increased time       General Comments: Pt requiring increased time and fatigue. Pt following commands      Exercises General Exercises - Lower Extremity Long Arc Quad: Both;Seated;10 reps;AROM Hip Flexion/Marching: AROM;Strengthening;Both;10 reps;Seated    General Comments        Pertinent Vitals/Pain Pain Assessment: No/denies pain    Home Living  Prior Function            PT Goals (current goals can now be found in the care plan section) Acute Rehab PT Goals Patient Stated Goal: to walk Time For Goal Achievement: 10/23/20 Potential to Achieve Goals: Good Progress towards PT goals: Progressing toward goals    Frequency    Min 3X/week      PT Plan Current plan remains appropriate    Co-evaluation              AM-PAC PT "6 Clicks" Mobility   Outcome Measure  Help needed  turning from your back to your side while in a flat bed without using bedrails?: A Little Help needed moving from lying on your back to sitting on the side of a flat bed without using bedrails?: A Little Help needed moving to and from a bed to a chair (including a wheelchair)?: A Little Help needed standing up from a chair using your arms (e.g., wheelchair or bedside chair)?: A Little Help needed to walk in hospital room?: A Little Help needed climbing 3-5 steps with a railing? : A Lot 6 Click Score: 17    End of Session Equipment Utilized During Treatment: Oxygen;Back brace;Gait belt Activity Tolerance: Patient tolerated treatment well Patient left: with call bell/phone within reach;in chair;with chair alarm set;with family/visitor present Nurse Communication: Mobility status PT Visit Diagnosis: Pain;Difficulty in walking, not elsewhere classified (R26.2);Unsteadiness on feet (R26.81);Other abnormalities of gait and mobility (R26.89);Muscle weakness (generalized) (M62.81) Pain - Right/Left: Right Pain - part of body:  (back)     Time: 5320-2334 PT Time Calculation (min) (ACUTE ONLY): 36 min  Charges:  $Therapeutic Activity: 23-37 mins                      Arby Barrette, PT Pager [phone]    Rexanne Mano 10/10/2020, 3:57 PM

## 2020-10-10 NOTE — Care Management Important Message (Signed)
Important Message  Patient Details  Name: Devin [name] MRN: [id number] Date of Birth: 1940-09-20   Medicare Important Message Given:  Yes     Alpheus Stiff 10/10/2020, 12:00 PM

## 2020-10-10 NOTE — Progress Notes (Signed)
RT unable to pass suction cath through pt's trach. RT attempted to lavage suction pt, was unsuccessful. RT placed CO2 detector on trach, with no positive color change. Pt's VS remained stable. RT contacted Dr. Kary Kos to come and assess pt. MD at bedside with 3 RT's. MD performed bedside bronch just to see position of trach. During procedure pt maintained SVS. MD replaced trach with #6 XLT Distal cuffless. Pt tolerated well w/SVS. RN of pt was made aware of procedure. RT will continue to monitor pt.

## 2020-10-10 NOTE — Discharge Summary (Signed)
Physician Discharge Summary  Patient ID: Devin [name] MRN: [id number] DOB/AGE: 78/23/1942 78 y.o.  Admit date: 10/06/2020 Discharge date: 10/07/2020  Discharge Diagnoses:  Principal Problem:   Acute encephalopathy Active Problems:   Crohn's disease ([place])   Ineffective airway clearance   Status post tracheostomy ([place])   Atrial fibrillation (HCC)   Diabetes mellitus type 2 in nonobese [facility])   Thoracic vertebral fracture ([place])   Hypoglycemia   Discharged Condition:  Serious  Significant Diagnostic Studies: DG Abd 1 View  Result Date: 10/07/2020 CLINICAL DATA:  NG tube placement EXAM: ABDOMEN - 1 VIEW COMPARISON:  None. FINDINGS: Enteric tube overlies the right lung base and is likely within and airway. Unremarkable bowel gas pattern. Extensive thoracolumbar fusion construct. IMPRESSION: Enteric tube is likely malposition within right lower lobe airway. These results will be called to the ordering clinician or representative by the Radiologist Assistant, and communication documented in the PACS or [employer]. Electronically Signed   By: Macy Mis M.D.   On: 10/07/2020 14:43   DG Chest Port 1 View  Result Date: 10/07/2020 CLINICAL DATA:  Aspiration pneumonia EXAM: PORTABLE CHEST 1 VIEW COMPARISON:  09/21/2020 FINDINGS: Single frontal view of the chest demonstrates stable tracheostomy tube. The enteric catheter seen previously has been removed. Cardiac silhouette is stable. Persistent bibasilar consolidation and effusions, right greater than left. No evidence of pneumothorax. No acute bony abnormalities. Stable postsurgical changes of the thoracic spine. IMPRESSION: 1. Stable bibasilar consolidation and effusions, right greater than left. Findings could reflect airspace disease, aspiration, or atelectasis. Electronically Signed   By: Randa Ngo M.D.   On: 10/07/2020 15:35    Labs:  Basic Metabolic Panel: Recent Labs  Lab 10/05/20 0236 10/05/20 0755 10/05/20 1200  10/05/20 1646 10/06/20 0151 10/07/20 0526  NA 131* 130* 133*  --  134* 133*  K 6.2* 7.0* 6.3* 5.4* 5.1 4.5  CL 92* 91* 89*  --  89* 89*  CO2 31 32 36*  --  38* 35*  GLUCOSE 235* 360* 269*  --  116* 87  BUN 56* 60* 66*  --  77* 69*  CREATININE 1.34* 1.44* 1.49*  --  1.47* 1.20  CALCIUM 8.5* 8.5* 8.5*  --  8.9 8.9    Brief HPI:   Devin [name] is a 80 y.o. male with history of HTN, Crohn's disease, A fib, LUL lobectomy for lung cancer, restrictive lung disease, ankylosing spondylosis who sustained a fall on 09/07/2020 with progressive pain.  He was admitted on 09/10/2020 with acute on chronic hypercarbic respiratory failure, T11 Chance fracture and agitation.  He underwent posterior thoracic fusion T11-T12 on 04/08 by Dr. Ronnald Ramp and had difficulty tolerating extubation and ultimately required tracheostomy on 04/12.  Hospital course significant for issues with diarrhea due to inability to get Pentasa secondary to his n.p.o. status.  Dr. Hilarie Fredrickson recommended managing diarrhea with rectal tube as well as Lomotil and probiotics until patient able to tolerate oral.  He was found to have copious secretions via trach with Kleb pneumonia and was treated with ceftriaxone.  He was extubated to ATC and remain n.p.o. with ongoing copious secretions.  He underwent laryngoscopy with biopsy of supraglottic mass noted on MBS and pathology was pending.  He has had issues with hyperkalemia with nephrology following for input.  His respiratory status was improving but he continued to require 10 L oxygen with activity to recover.  Therapy was ongoing and he continued to be limited by weakness, DOE, balance deficits, cognitive deficits  with delay in processing. CIR was recommended due to functional decline.   Hospital Course: Devin [name] was admitted to rehab 10/06/2020 for inpatient therapies to consist of PT, ST and OT at least three hours five days a week. Past admission physiatrist, therapy team and rehab RN  have worked together to provide customized collaborative inpatient rehab. He was found to be retaining urine and required I/O caths for volumes ranging from 400-600 cc.  He continued to have oral secretions and was able to suction independently but decline chest PT. On am of 04/30, it was noted that feeding tube had been pulled out and NGT placement attempted but was removed due to malpositioning. He did develop hypoglycemia requiring glucagon X 2 and started on IV dextrose. CXR showed concerns of aspiration PNA with bilateral effusions therefore he was started on broad spectrum antibiotics.   He was  developed worsening of hypoxia with confusion and agitation as well as rise in WBC. He was transferred to acute floor for work up and closer monitoring.    Signed: Bary Leriche 10/11/2020, 5:34 PM

## 2020-10-10 NOTE — Plan of Care (Signed)
  Problem: Elimination: Goal: Will not experience complications related to bowel motility Outcome: Progressing   Problem: Pain Managment: Goal: General experience of comfort will improve Outcome: Progressing   Problem: Safety: Goal: Ability to remain free from injury will improve Outcome: Progressing   Problem: Skin Integrity: Goal: Risk for impaired skin integrity will decrease Outcome: Progressing

## 2020-10-10 NOTE — Progress Notes (Addendum)
PROGRESS NOTE  Devin [name]  OXB:[id number] DOB: [birth year] DOA: 10/07/2020 PCP: Devin Solian, MD  Brief Narrative:   80 year old white male A. 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 Isaias [name] 4/27  Patient subsequently came back to acute care because of dislodged  Core track which was accidental Patient became hypoglycemic and encephalopathic--patient was placed on D5 and has improved His antibiotics have been continued given concerns for aspiration and significant secretions  Hospital-Problem based course   Emergent trach exchange at bedside 5/3--supra and subglottic stenosis with tracheomalacia see separate note by CCM CT of neck is pending --defer to CCM further planning Dislodged Coretrack Coretrack placed--cont Feeds CBG q4 118-143 Sepsis secondary to Klebsiella pneumonia from prior to admission concern however for superimposed aspiration pneumonia Continue vancomycin Zosyn--procalcitonin trending down CXR this a.m. 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   Difficult to place patient No  Consultants:   Interventional radiology Dr. Anselm [name]  ENT Dr. Lorin [name]  Procedures:   Tracheotomy exchange 5/3 AM  Antimicrobials: Vancomycin Zosyn   Subjective:  Events this morning noted He is recovering and is stable at this time He is able to wear the Passy-Muir    Objective: Vitals:   10/10/20 0510 10/10/20 0803 10/10/20 0820 10/10/20 0907  BP: (!) 118/46 (!) 130/44    Pulse:  66 72 98  Resp:   18 20  Temp:  98.5 F (36.9 C)    TempSrc:  Oral    SpO2:  96% 94% 100%  Weight:        Intake/Output Summary (Last 24 hours) at 10/10/2020 1053 Last data filed at 10/10/2020 0300 Gross per 24 hour  Intake 160.45 ml  Output --  Net 160.45 ml   Filed Weights   10/08/20 0432 10/09/20 0500  Weight: 91.5 kg 91.3 kg    Examination:  EOMI NCAT  New trach in place--core track  remains in place Power is 5/5 no icterus no pallor Decreased breath sounds right lower lung lobes Abdomen soft no rebound no guarding Psych euthymic pleasant  Data Reviewed: personally reviewed   FINAL MICROSCOPIC DIAGNOSIS:   A.  PHARYNX, RIGHT POSTERIOR WALL, BIOPSY:  - Reactive squamous mucosa with mild chronic inflammation and focal  hemorrhage.  - No dysplasia or malignancy.  CBC    Component Value Date/Time   WBC 14.5 (H) 10/10/2020 0357   RBC 2.77 (L) 10/10/2020 0357   HGB 8.2 (L) 10/10/2020 0357   HGB 12.9 (L) 09/06/2019 0924   HGB 12.6 (L) 02/18/2017 1011   HCT 27.4 (L) 10/10/2020 0357   HCT 37.3 (L) 02/18/2017 1011   PLT 454 (H) 10/10/2020 0357   PLT 261 09/06/2019 0924   PLT 246 02/18/2017 1011   MCV 98.9 10/10/2020 0357   MCV 94.2 02/18/2017 1011   MCH 29.6 10/10/2020 0357   MCHC 29.9 (L) 10/10/2020 0357   RDW 14.3 10/10/2020 0357   RDW 13.3 02/18/2017 1011   LYMPHSABS 1.1 10/10/2020 0357   LYMPHSABS 1.6 02/18/2017 1011   MONOABS 1.2 (H) 10/10/2020 0357   MONOABS 0.6 02/18/2017 1011   EOSABS 0.4 10/10/2020 0357   EOSABS 0.2 02/18/2017 1011   BASOSABS 0.0 10/10/2020 0357   BASOSABS 0.0 02/18/2017 1011   CMP Latest Ref Rng & Units 10/10/2020 10/09/2020 10/08/2020  Glucose 70 - 99 mg/dL 132(H) 159(H) 93  BUN 8 - 23 mg/dL 32(H) 30(H) 42(H)  Creatinine 0.61 - 1.24 mg/dL 1.11 1.00 1.00  Sodium 135 - 145 mmol/L 134(L) 136 136  Potassium 3.5 - 5.1 mmol/L 4.3 4.5 4.5  Chloride 98 - 111 mmol/L 94(L) 95(L) 91(L)  CO2 22 - 32 mmol/L 34(H) 36(H) 37(H)  Calcium 8.9 - 10.3 mg/dL 8.5(L) 8.3(L) 8.8(L)  Total Protein 6.5 - 8.1 g/dL 5.1(L) 5.0(L) -  Total Bilirubin 0.3 - 1.2 mg/dL 0.5 0.5 -  Alkaline Phos 38 - 126 U/L 91 85 -  AST 15 - 41 U/L 13(L) 17 -  ALT 0 - 44 U/L 10 11 -     Radiology Studies: DG Abd Portable 1V  Result Date: 10/09/2020 CLINICAL DATA:  Status post nasogastric catheter placement EXAM: PORTABLE ABDOMEN - 1 VIEW COMPARISON:  None. FINDINGS: Scattered large and small bowel gas is noted. New feeding catheter is noted with the weighted tip in the distal stomach. Extensive hardware is noted overlying the thoracolumbar spine as well as extrinsic to the patient. IMPRESSION: Feeding catheter  within the distal stomach. Electronically Signed   By: Inez Catalina M.D.   On: 10/09/2020 10:32   DG [place] Dys Tube Plc W/Fl W/Rad  Result Date: 10/08/2020 CLINICAL DATA:  Feeding tube needed due to dysphagia and tracheostomy in place. EXAM: NASO G TUBE PLACEMENT WITH FL AND WITH RAD FLUOROSCOPY TIME:  Fluoroscopy Time:  3 minutes 12 seconds 64.4 mGy COMPARISON:  None. FINDINGS: Using fluoroscopic guidance, a weighted tip feeding tube was advanced to the stomach. Stylet was removed. Tubing was taped at the nose. Patient was returned to the floor in stable condition. IMPRESSION: Successful placement of a weighted tip feeding tube into the stomach using fluoroscopic guidance. Electronically Signed   By: Franki Cabot M.D.   On: 10/08/2020 12:51     Scheduled Meds: . acetaminophen (TYLENOL) oral liquid 160 mg/5 mL  500 mg Per Tube Q6H  . ALPRAZolam  0.5 mg Per Tube BID  . apixaban  5 mg Per Tube BID  . atenolol  25 mg Per Tube BID  . budesonide (PULMICORT) nebulizer solution  0.5 mg Nebulization BID  . chlorhexidine  15 mL Mouth/Throat BID  . Chlorhexidine Gluconate Cloth  6 each Topical Daily  . cholestyramine  4 g Per Tube BID AC  . diltiazem  30 mg Oral Q8H  . feeding supplement (PROSource TF)  45 mL Per Tube Daily  . free water  170 mL Per Tube Q4H  . Gerhardt's butt cream   Topical BID  . insulin aspart  0-9 Units Subcutaneous Q4H  . [START ON 10/11/2020] lactobacillus  1 g Per Tube TID WC  . levothyroxine  125 mcg Per Tube QAC breakfast  . loperamide HCl  4 mg Per Tube QID  . mouth rinse  15 mL Mouth Rinse 10 times per day  . nutrition supplement (JUVEN)  1 packet Per Tube BID BM   Continuous Infusions: . feeding supplement (GLUCERNA 1.5 CAL) 1,000 mL (10/09/20 2108)  . piperacillin-tazobactam (ZOSYN)  IV 3.375 g (10/10/20 0906)  . vancomycin 1,750 mg (10/09/20 1801)     LOS: 3 days   Time spent: 25 minutes   Nita Sells, MD [facility] To contact the attending  provider between 7A-7P or the covering provider during after hours 7P-7A, please log into the web site [email or web address] and access using universal [place] password for that web site. If you do not have the password, please call the hospital operator.  10/10/2020, 10:53 AM

## 2020-10-10 NOTE — Progress Notes (Signed)
Inpatient Rehabilitation Admissions Coordinator  Noted medical issues today requiring further medical/surgical follow up. I have withdrawn insurance authorization for CIR readmit and will follow his case to assist with planning dispo when appropriate. May need consideration also for PEG placement.  Danne Baxter, RN, MSN Rehab Admissions Coordinator [phone] 10/10/2020 11:29 AM

## 2020-10-11 DIAGNOSIS — Z430 Encounter for attention to tracheostomy: Secondary | ICD-10-CM | POA: Diagnosis not present

## 2020-10-11 DIAGNOSIS — Z930 Tracheostomy status: Secondary | ICD-10-CM

## 2020-10-11 DIAGNOSIS — J386 Stenosis of larynx: Secondary | ICD-10-CM | POA: Diagnosis not present

## 2020-10-11 DIAGNOSIS — I4811 Longstanding persistent atrial fibrillation: Secondary | ICD-10-CM

## 2020-10-11 LAB — COMPREHENSIVE METABOLIC PANEL
ALT: 10 U/L (ref 0–44)
AST: 15 U/L (ref 15–41)
Albumin: 2 g/dL — ABNORMAL LOW (ref 3.5–5.0)
Alkaline Phosphatase: 103 U/L (ref 38–126)
Anion gap: 6 (ref 5–15)
BUN: 26 mg/dL — ABNORMAL HIGH (ref 8–23)
CO2: 33 mmol/L — ABNORMAL HIGH (ref 22–32)
Calcium: 8.4 mg/dL — ABNORMAL LOW (ref 8.9–10.3)
Chloride: 94 mmol/L — ABNORMAL LOW (ref 98–111)
Creatinine, Ser: 1.06 mg/dL (ref 0.61–1.24)
GFR, Estimated: >60 mL/min (ref >60)
Glucose, Bld: 127 mg/dL — ABNORMAL HIGH (ref 70–99)
Potassium: 4.3 mmol/L (ref 3.5–5.1)
Sodium: 133 mmol/L — ABNORMAL LOW (ref 135–145)
Total Bilirubin: 0.4 mg/dL (ref 0.3–1.2)
Total Protein: 5.2 g/dL — ABNORMAL LOW (ref 6.5–8.1)

## 2020-10-11 LAB — CBC WITH DIFFERENTIAL/PLATELET
Abs Immature Granulocytes: 0.4 10*3/uL — ABNORMAL HIGH (ref 0.00–0.07)
Basophils Absolute: 0 10*3/uL (ref 0.0–0.1)
Basophils Relative: 0 %
Eosinophils Absolute: 0.4 10*3/uL (ref 0.0–0.5)
Eosinophils Relative: 3 %
HCT: 27.7 % — ABNORMAL LOW (ref 39.0–52.0)
Hemoglobin: 8.3 g/dL — ABNORMAL LOW (ref 13.0–17.0)
Immature Granulocytes: 3 %
Lymphocytes Relative: 9 %
Lymphs Abs: 1 10*3/uL (ref 0.7–4.0)
MCH: 29.4 pg (ref 26.0–34.0)
MCHC: 30 g/dL (ref 30.0–36.0)
MCV: 98.2 fL (ref 80.0–100.0)
Monocytes Absolute: 0.9 10*3/uL (ref 0.1–1.0)
Monocytes Relative: 8 %
Neutro Abs: 9.1 10*3/uL — ABNORMAL HIGH (ref 1.7–7.7)
Neutrophils Relative %: 77 %
Platelets: 365 10*3/uL (ref 150–400)
RBC: 2.82 MIL/uL — ABNORMAL LOW (ref 4.22–5.81)
RDW: 14.8 % (ref 11.5–15.5)
WBC: 11.8 10*3/uL — ABNORMAL HIGH (ref 4.0–10.5)
nRBC: 0 % (ref 0.0–0.2)

## 2020-10-11 LAB — GLUCOSE, CAPILLARY
Glucose-Capillary: 122 mg/dL — ABNORMAL HIGH (ref 70–99)
Glucose-Capillary: 125 mg/dL — ABNORMAL HIGH (ref 70–99)
Glucose-Capillary: 149 mg/dL — ABNORMAL HIGH (ref 70–99)
Glucose-Capillary: 148 mg/dL — ABNORMAL HIGH (ref 70–99)
Glucose-Capillary: 160 mg/dL — ABNORMAL HIGH (ref 70–99)
Glucose-Capillary: 117 mg/dL — ABNORMAL HIGH (ref 70–99)

## 2020-10-11 LAB — PROCALCITONIN: Procalcitonin: 1.9 ng/mL

## 2020-10-11 MED ORDER — ALPRAZOLAM 0.5 MG PO TABS
0.5000 mg | ORAL_TABLET | Freq: Once | ORAL | Status: AC
  Administered 2020-10-11: 0.5 mg via ORAL
  Filled 2020-10-11: qty 1

## 2020-10-11 NOTE — Progress Notes (Signed)
Pharmacy Antibiotic Note  Devin [name] is a 80 y.o. male admitted on 10/07/2020 with concern on pneumonia.   Continues on Vancomycin and Zosyn -- Day #5  Recent trach cultures (4/11) with klebsiella Pn ->no new cultures   Plan: -Zosyn 3.375gm IV q8h -Vancomycin 2075m IV x1 followed by 1750 mg IV q24h (estimated AUC= 443) -What is planned LOT? / Add stop dates?   Weight: 91.3 kg (201 lb 4.5 oz)  Temp (24hrs), Avg:98.1 F (36.7 C), Min:97.7 F (36.5 C), Max:98.7 F (37.1 C)  Recent Labs  Lab 10/07/20 0526 10/08/20 0254 10/09/20 0130 10/10/20 0357 10/11/20 0338  WBC 14.0* 16.9* 11.9* 14.5* 11.8*  CREATININE 1.20 1.00 1.00 1.11 1.06    Estimated Creatinine Clearance: 65.1 mL/min (by C-G formula based on SCr of 1.06 mg/dL).    No Known Allergies  Thank you LAnette Guarneri PharmD **Pharmacist phone directory can now be found on [email or web address] ([email or web address]).  Listed under [place]

## 2020-10-11 NOTE — Progress Notes (Addendum)
PROGRESS NOTE    Devin [name]  QDI:[id number] DOB: [birth year] DOA: 10/07/2020 PCP: Prince Solian, MD    Brief Narrative:  80 year old white male A. 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 Isaias Cowman 4/27  Patient subsequently came back to acute care because of dislodged  Core track which was accidental Patient became hypoglycemic and encephalopathic--patient was placed on D5 and has improved His antibiotics have been continued given concerns for aspiration and significant secretions  Assessment & Plan:   Principal Problem:   Hypoglycemia Active Problems:   Essential hypertension   Crohn's disease ([place])   Thyroid cancer ([place])   Respiratory failure ([place])   Status post thoracic spinal fusion   Status post tracheostomy [facility])   Atrial fibrillation ([place])   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[place])   Obstruction of upper airway due to foreign body   Emergent trach exchange at bedside 5/3--supra and subglottic stenosis with tracheomalacia Appreciate assistance by PCCM Pt is now s/p emergent trach change 5/3 with findings of increased inflammatory tissue on the post wall of the trachea with recs to keep distal XLT and possible trach change in 2 weeks  Dislodged Coretrack Coretrack was replaced-cont Feeds as tolerated Glucose trends stable, reviewed  Sepsis secondary to Klebsiella pneumonia from prior to admission concern however for superimposed aspiration pneumonia Was initially continued with vancomycin  Zosyn--procalcitonin trending down Have narrowed abx coverage to zosyn monotherapy Recent MRSA nasal swab was neg  Metabolic encephalopathy Likely secondary to severe hypoglycemia Resolved at this time, per above  Severe hypoglycemia on admission Resumed Nepro Prosource and Juven  AKI resolved-IV fluid discontinued Cont to follow bmet  Status post laking of rod system T7-11 + T11-L4 Recommend outpatient neurosurgery follow-up  Postsurgical hypothyroidism Cont with home meds  Laryngeal mass not cancer Appreciate PCCM assistance Path result reviewed, noted to have reactive changes with chronic inflammation and focal hemorrhage. No malignancy  Paroxysmal A. 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   Per PCCM, monitor for now as pt is not symptomatic   DVT prophylaxis: Eliquis Code Status: Full Family Communication: Pt in room, family not at bedside  Status is: Inpatient  Remains inpatient appropriate because:Inpatient level of care appropriate due to severity of illness   Dispo: The patient is from: CIR              Anticipated d/c is to: CIR              Patient currently is not medically stable to d/c.   Difficult to place patient No       Consultants:   PCCM  CIR  Procedures:   Trach change emergently 5/3 by PCCM  Antimicrobials: Anti-infectives (From admission, onward)   Start     Dose/Rate Route Frequency Ordered Stop  10/08/20 1900  vancomycin (VANCOREADY) IVPB 1750 mg/350 mL  Status:  Discontinued        1,750 mg 175 mL/hr over 120 Minutes Intravenous Every 24 hours 10/07/20 1736 10/11/20 1201   10/08/20 0030  piperacillin-tazobactam (ZOSYN) IVPB  3.375 g        3.375 g 12.5 mL/hr over 240 Minutes Intravenous Every 8 hours 10/07/20 1736     10/07/20 1830  vancomycin (VANCOREADY) IVPB 2000 mg/400 mL        2,000 mg 200 mL/hr over 120 Minutes Intravenous  Once 10/07/20 1736 10/07/20 2129   10/07/20 1830  piperacillin-tazobactam (ZOSYN) IVPB 3.375 g        3.375 g 100 mL/hr over 30 Minutes Intravenous  Once 10/07/20 1736 10/07/20 1956       Subjective: Without complaints  Objective: Vitals:   10/11/20 1133 10/11/20 1219 10/11/20 1535 10/11/20 1539  BP:  (!) 140/50  (!) 142/48  Pulse: 67 63 75 65  Resp: (!) 30 (!) 22 (!) 21 18  Temp:  97.9 F (36.6 C)  98.2 F (36.8 C)  TempSrc:  Oral  Oral  SpO2: 99% 99% 98% 98%  Weight:        Intake/Output Summary (Last 24 hours) at 10/11/2020 1801 Last data filed at 10/11/2020 1507 Gross per 24 hour  Intake 7021.67 ml  Output 4700 ml  Net 2321.67 ml   Filed Weights   10/08/20 0432 10/09/20 0500  Weight: 91.5 kg 91.3 kg    Examination: General exam: Awake, laying in bed, in nad Respiratory system: Normal respiratory effort, no wheezing Cardiovascular system: regular rate, s1, s2 Gastrointestinal system: Soft, nondistended, positive BS Central nervous system: CN2-12 grossly intact, strength intact Extremities: Perfused, no clubbing Skin: Normal skin turgor, no notable skin lesions seen Psychiatry: Mood normal // no visual hallucinations    Data Reviewed: I have personally reviewed following labs and imaging studies  CBC: Recent Labs  Lab 10/07/20 0526 10/08/20 0254 10/09/20 0130 10/10/20 0357 10/11/20 0338  WBC 14.0* 16.9* 11.9* 14.5* 11.8*  NEUTROABS 11.1* 13.5* 9.3* 11.4* 9.1*  HGB 8.1* 8.4* 7.6* 8.2* 8.3*  HCT 26.4* 27.3* 25.7* 27.4* 27.7*  MCV 95.0 96.5 99.2 98.9 98.2  PLT 497* 520* 433* 454* 572   Basic Metabolic Panel: Recent Labs  Lab 10/07/20 0526 10/08/20 0254 10/09/20 0130 10/10/20 0357 10/11/20 0338  NA 133* 136 136 134* 133*  K 4.5 4.5 4.5  4.3 4.3  CL 89* 91* 95* 94* 94*  CO2 35* 37* 36* 34* 33*  GLUCOSE 87 93 159* 132* 127*  BUN 69* 42* 30* 32* 26*  CREATININE 1.20 1.00 1.00 1.11 1.06  CALCIUM 8.9 8.8* 8.3* 8.5* 8.4*   GFR: Estimated Creatinine Clearance: 65.1 mL/min (by C-G formula based on SCr of 1.06 mg/dL). Liver Function Tests: Recent Labs  Lab 10/07/20 0526 10/09/20 0130 10/10/20 0357 10/11/20 0338  AST 15 17 13* 15  ALT 11 11 10 10   ALKPHOS 118 85 91 103  BILITOT 0.4 0.5 0.5 0.4  PROT 5.8* 5.0* 5.1* 5.2*  ALBUMIN 2.4* 1.9* 2.0* 2.0*   No results for input(s): LIPASE, AMYLASE in the last 168 hours. No results for input(s): AMMONIA in the last 168 hours. Coagulation Profile: No results for input(s): INR, PROTIME in the last 168 hours. Cardiac Enzymes: No results for input(s): CKTOTAL, CKMB, CKMBINDEX, TROPONINI in the last 168 hours. BNP (last 3 results) No results for input(s): PROBNP in the last 8760 hours. HbA1C: No results for input(s):  HGBA1C in the last 72 hours. CBG: Recent Labs  Lab 10/10/20 2349 10/11/20 0442 10/11/20 0733 10/11/20 1222 10/11/20 1617  GLUCAP 115* 122* 125* 149* 148*   Lipid Profile: No results for input(s): CHOL, HDL, LDLCALC, TRIG, CHOLHDL, LDLDIRECT in the last 72 hours. Thyroid Function Tests: No results for input(s): TSH, T4TOTAL, FREET4, T3FREE, THYROIDAB in the last 72 hours. Anemia Panel: No results for input(s): VITAMINB12, FOLATE, FERRITIN, TIBC, IRON, RETICCTPCT in the last 72 hours. Sepsis Labs: Recent Labs  Lab 10/09/20 1416 10/10/20 0357 10/11/20 0338  PROCALCITON 3.71 2.90 1.90    Recent Results (from the past 240 hour(s))  Culture, Urine     Status: None   Collection Time: 10/07/20  2:20 PM   Specimen: Urine, Random  Result Value Ref Range Status   Specimen Description URINE, RANDOM  Final   Special Requests NONE  Final   Culture   Final    NO GROWTH Performed at [facility], [address] [place], [place] [address]    Report  Status 10/08/2020 FINAL  Final     Radiology Studies: CT SOFT TISSUE NECK W CONTRAST  Result Date: 10/10/2020 CLINICAL DATA:  Concern for progressive subglottic stenosis and obstruction. Tracheostomy. Recent laryngoscopy demonstrated no discrete mass lesion of the mucosa. There is fullness of the posterior pharyngeal wall on the right which was biopsied. No malignancy detected on pathology. EXAM: CT NECK WITH CONTRAST TECHNIQUE: Multidetector CT imaging of the neck was performed using the standard protocol following the bolus administration of intravenous contrast. CONTRAST:  73m OMNIPAQUE IOHEXOL 300 MG/ML  SOLN COMPARISON:  CT neck 09/30/2020 FINDINGS: Pharynx and larynx: Tracheostomy again noted in satisfactory position. NG tube also in place in the esophagus. Previously noted fullness in the retropharyngeal soft tissues on the right has significantly improved in the interval. No discrete mass identified. Epiglottis and larynx normal. No airway compromise. No evidence of subglottic stenosis. The tip of the tracheostomy tube is not included on the study. Salivary glands: Negative Thyroid: Postop thyroidectomy for cancer. Stable soft tissue density in the thyroid bed bilaterally without defined mass lesion. This may be due to postoperative scar tissue in the thyroid bed. Lymph nodes: Negative for enlarged lymph nodes in the neck. Vascular: Normal vascular enhancement. Limited intracranial: Negative Visualized orbits: Negative Mastoids and visualized paranasal sinuses: Mild mucosal edema right maxillary sinus. Remaining sinuses clear. Skeleton: No acute skeletal abnormality. Upper chest: Moderately large right pleural effusion. 6 mm spiculated nodule left upper lobe unchanged from the CT chest 01/04/2020 Other: None IMPRESSION: 1. Negative for subglottic stenosis. Tracheostomy in satisfactory position. The tip of the tracheostomy is not included on the study. 2. Interval improvement in retropharyngeal fullness  on the right. 3. Postop thyroidectomy. Stable soft tissue in the thyroid bed bilaterally, stable from prior study. 4. Moderate large right pleural effusion 5. Stable 6 mm left upper lobe nodule. Electronically Signed   By: CFranchot GalloM.D.   On: 10/10/2020 13:36   DG CHEST PORT 1 VIEW  Result Date: 10/10/2020 CLINICAL DATA:  Encounter for tracheostomy tube EXAM: PORTABLE CHEST 1 VIEW COMPARISON:  10/07/2020 FINDINGS: Tracheostomy tube in place. The feeding tube at least reaches the stomach. Hazy bilateral lower chest attributed to layering pleural effusions, potentially large on the right where there has been progression. No visible pneumothorax. Stable partially obscured heart size. Obscured right more than left lower lungs. IMPRESSION: Bilateral pleural effusion with increase on the right. The lower lungs are largely obscured. Electronically  Signed   By: Monte Fantasia M.D.   On: 10/10/2020 10:51    Scheduled Meds: . acetaminophen (TYLENOL) oral liquid 160 mg/5 mL  500 mg Per Tube Q6H  . ALPRAZolam  0.5 mg Per Tube BID  . ALPRAZolam  0.5 mg Oral Once  . apixaban  5 mg Per Tube BID  . atenolol  25 mg Per Tube BID  . budesonide (PULMICORT) nebulizer solution  0.5 mg Nebulization BID  . chlorhexidine  15 mL Mouth/Throat BID  . Chlorhexidine Gluconate Cloth  6 each Topical Daily  . cholestyramine  4 g Per Tube BID AC  . diltiazem  30 mg Oral Q8H  . feeding supplement (PROSource TF)  45 mL Per Tube Daily  . free water  170 mL Per Tube Q4H  . Gerhardt's butt cream   Topical BID  . insulin aspart  0-9 Units Subcutaneous Q4H  . lactobacillus  1 g Per Tube TID WC  . levothyroxine  125 mcg Per Tube QAC breakfast  . loperamide HCl  4 mg Per Tube QID  . mouth rinse  15 mL Mouth Rinse 10 times per day  . nutrition supplement (JUVEN)  1 packet Per Tube BID BM   Continuous Infusions: . feeding supplement (GLUCERNA 1.5 CAL) 1,000 mL (10/11/20 1510)  . piperacillin-tazobactam (ZOSYN)  IV 3.375 g  (10/11/20 1507)     LOS: 4 days   Marylu Lund, MD [facility] Pager On Amion  If 7PM-7AM, please contact night-coverage 10/11/2020, 6:01 PM

## 2020-10-11 NOTE — Progress Notes (Signed)
Inpatient Rehabilitation Admissions Coordinator  I will begin authorization again with insurance for possible readmit to CIR in next 1 to 2 days pending insurance approval.  Danne Baxter, RN, MSN Rehab Admissions Coordinator [phone] 10/11/2020 12:16 PM

## 2020-10-11 NOTE — Progress Notes (Signed)
NAME:  Devin [name], MRN:  [id number], DOB:  06-27-1940, LOS: 4 ADMISSION DATE:  10/07/2020, CONSULTATION DATE:  5/3 REFERRING MD:  Verlon Au, CHIEF COMPLAINT:  Dyspnea   History of Present Illness:  This is a 80 year old male who we had been following s/p fall resulting in T11/12 fracture requiring requiring fusion and complicated by prolonged mechanical ventilation and failed extubation attempt x 2 requiring trach. He had been dc'd to rehab. Re-admitted to hospital  after cor trak dislodged and had aspiration PNA c/b hypoglycemia and delirium.  Critical care emergently consulted 2/2 trach obstruction on 5/3  Pertinent  Medical History  afib on chronic ac, DM type II, prior lobectomy (LUL), chron's disease, thyroid cancer, prostate cancer, T11-T12 fracture, required posterior fusion, failed extubation 2/2 upper-airway obstruction. Required trach. Went to rehab.   Recent soft tissue mass at level of supraglottic larynx->neg for malignancy (this was incidentally identified initially on MBS showing thickening prevertebral tissue w/ CT head/neck showing prevertebral soft tissue mass concerning for malignancy   Significant Hospital Events: Including procedures, antibiotic start and stop dates in addition to other pertinent events    4/29 admitted for cortrak malposition, aspiration and encephalopathy  5/3 PCCM emergently consulted for inability to suction and upper airway obstruction Had sig increase of inflammatory tissue on posterior wall of trachea. Was able to bypass with distal XLT. Still has some intermittent tracheomalacia w/ dynamic collapse of airway noted w/ cough   .   Interim History / Subjective:  Feels well No dyspnea [name] is doing OK Sitting up in chair  Objective   Blood pressure (!) 140/50, pulse 63, temperature 97.9 F (36.6 C), temperature source Oral, resp. rate (!) 22, weight 91.3 kg, SpO2 99 %.    FiO2 (%):  [28 %] 28 %   Intake/Output Summary (Last 24 hours)  at 10/11/2020 1502 Last data filed at 10/11/2020 1425 Gross per 24 hour  Intake 3817.67 ml  Output 4700 ml  Net -882.33 ml   Filed Weights   10/08/20 0432 10/09/20 0500  Weight: 91.5 kg 91.3 kg    Examination:  General:  Resting comfortably in bed HENT: NCAT OP clear trach in place PULM: CTA B, normal effort CV: RRR, no mgr GI: BS+, soft, nontender MSK: normal bulk and tone body brace in place Neuro: awake, alert, no distress, MAEW   Labs/imaging that I havepersonally reviewed  (right click and "Reselect all SmartList Selections" daily)  WBC 11.8 Hgb 8.3 Na 133  Resolved Hospital Problem list     Assessment & Plan:  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  Plan: Continue XLT trach Plan to replace XLT in 2 weeks Given challenging situation with two areas of airway narrowing, ENT will need to be able to scope him prior to removing the tracheostomy (ie. Decannulation)  Best practice (right click and "Reselect all SmartList Selections" daily)   Per primary  Labs   CBC: Recent Labs  Lab 10/07/20 0526 10/08/20 0254 10/09/20 0130 10/10/20 0357 10/11/20 0338  WBC 14.0* 16.9* 11.9* 14.5* 11.8*  NEUTROABS 11.1* 13.5* 9.3* 11.4* 9.1*  HGB 8.1* 8.4* 7.6* 8.2* 8.3*  HCT 26.4* 27.3* 25.7* 27.4* 27.7*  MCV 95.0 96.5 99.2 98.9 98.2  PLT 497* 520* 433* 454* 833    Basic Metabolic Panel: Recent Labs  Lab 10/07/20 0526 10/08/20 0254 10/09/20 0130  10/10/20 0357 10/11/20 0338  NA 133* 136 136 134* 133*  K 4.5 4.5 4.5 4.3 4.3  CL 89* 91* 95* 94* 94*  CO2 35* 37* 36* 34* 33*  GLUCOSE 87 93 159* 132* 127*  BUN 69* 42* 30* 32* 26*  CREATININE 1.20 1.00 1.00 1.11 1.06  CALCIUM 8.9 8.8* 8.3* 8.5* 8.4*   GFR: Estimated Creatinine Clearance: 65.1 mL/min (by C-G formula based on SCr of  1.06 mg/dL). Recent Labs  Lab 10/08/20 0254 10/09/20 0130 10/09/20 1416 10/10/20 0357 10/11/20 0338  PROCALCITON  --   --  3.71 2.90 1.90  WBC 16.9* 11.9*  --  14.5* 11.8*    Liver Function Tests: Recent Labs  Lab 10/07/20 0526 10/09/20 0130 10/10/20 0357 10/11/20 0338  AST 15 17 13* 15  ALT 11 11 10 10   ALKPHOS 118 85 91 103  BILITOT 0.4 0.5 0.5 0.4  PROT 5.8* 5.0* 5.1* 5.2*  ALBUMIN 2.4* 1.9* 2.0* 2.0*   No results for input(s): LIPASE, AMYLASE in the last 168 hours. No results for input(s): AMMONIA in the last 168 hours.  ABG    Component Value Date/Time   PHART 7.570 (H) 09/10/2020 1358   PCO2ART 35.2 09/10/2020 1358   PO2ART 148 (H) 09/10/2020 1358   HCO3 32.3 (H) 09/10/2020 1358   TCO2 23.0 03/17/2015 0540   ACIDBASEDEF 3.6 (H) 03/17/2015 0540   O2SAT 98.6 09/10/2020 1358     Coagulation Profile: No results for input(s): INR, PROTIME in the last 168 hours.  Cardiac Enzymes: No results for input(s): CKTOTAL, CKMB, CKMBINDEX, TROPONINI in the last 168 hours.  HbA1C: Hgb A1c MFr Bld  Date/Time Value Ref Range Status  09/10/2020 03:33 PM 6.4 (H) 4.8 - 5.6 % Final    Comment:    (NOTE) Pre diabetes:          5.7%-6.4%  Diabetes:              >6.4%  Glycemic control for   <7.0% adults with diabetes   07/03/2020 02:46 PM 6.2 (H) 4.8 - 5.6 % Final    Comment:    (NOTE) Pre diabetes:          5.7%-6.4%  Diabetes:              >6.4%  Glycemic control for   <7.0% adults with diabetes     CBG: Recent Labs  Lab 10/10/20 1942 10/10/20 2349 10/11/20 0442 10/11/20 0733 10/11/20 1222  GLUCAP 147* 115* 122* 125* 149*     Critical care time: n/a    Roselie Awkward, MD [facility] Pager: [phone] Cell: [phone] If no response, please call [phone] until 7pm After 7:00 pm call Elink  [phone]

## 2020-10-11 NOTE — Progress Notes (Signed)
Inpatient Rehabilitation Admissions Coordinator  I met with patient , his wife, daughter, Maudie Mercury at bedside. We discussed their concerns for returning to inpt rehab in the next 1 to 2 days pending insurance approval. Concerns are that he is claustrophobic and would like larger room, access to his Xanax as needed for he did take pta as well as need for occasional pain meds. I will follow up tomorrow.  Danne Baxter, RN, MSN Rehab Admissions Coordinator [phone] 10/11/2020 2:00 PM

## 2020-10-11 NOTE — PMR Pre-admission (Signed)
PMR Admission Coordinator Pre-Admission Assessment  Patient: Devin [name] is an 80 y.o., male MRN: [id number] DOB: [birth year] Height:   Weight: 91.3 kg              Insurance Information HMO: yes    PPO:      PCP:      IPA:      80/20:      OTHER:  PRIMARY: [place] [employer]      Policy#: [id number]      Subscriber: pt CM Name: Devin [name]    Phone#: [phone] option 7     Fax#: [phone] Pre-Cert#: [id number] approved for until 5/11 when updates are due      Employer:  Benefits:  Phone #: [phone]     Name: 4/28 Eff. Date: 06/10/2020     Deduct: none      Out of Pocket Max: $3600      Life Max: none  CIR: $295 co pay per day days 1 until 6      SNF: no copay days 1 until 20; $188 co pay per day days 21 until 49; no copay days 41 until 100 Outpatient: $30 per visit     Co-Pay: visits per medical neccesity Home Health: 80%      Co-Pay: 20% DME: 80%     Co-Pay: 205 Providers: in network  SECONDARY: none       Financial Counselor:       Phone#:   The Therapist, art Information Summary" for patients in Inpatient Rehabilitation Facilities with attached "Privacy Act [place] Records" was provided and verbally reviewed with: Patient and Family  Emergency Contact Information Contact Information    Name Relation Home Work Mobile   Devin [name] Spouse [phone]  [phone]   Devin [name] Daughter   [phone]   Devin [name] Daughter   [phone]     Current Medical History  Patient Admitting Diagnosis: Debility  History of Present Illness: 80 y.o.malewith history of [name] fib, Crohn's, lung cancer s/p RSXN, thyroid cancer, prostate cancer, T2DM, COPD, ankylosing spondylitis with chronic LBP and gait disorder who sustained [name] fall on 3/31/2022with progressive pain. History taken from chart review and patient due to trach. He was admitted on 09/10/2020 with ongoing pain, shortness of breath, andagitation. He became unresponsive in ED requiring  intubation for acute on chronic hypercarbic respiratory failure. CT chest revealedacute T11vertebral fracture and TLSOrecommended by Dr. Ronnald Ramp. Once cleared by PCCM, he underwent posterior thoracic fusionT11-T12 on 09/15/2020 he was unable to tolerate extubation X 2 despite decadron to help with edema and ultimately required tracheostomy on4/05/2021. [name] course further complicated by atrial fibrillation with RVR, copious secretions, Kleb PNA, fluid overload, diarrhea due to crohn's. He was started on weaning trials and has been off vent since 04/18. He was started on PMSV trials and therapy ongoing.  On 4/22 failed barium swallow evaluation. Per SLP, noted thickening of the prevertebral tissue at the level of C 4-5. CT cervical spine recommended and showed soft tissue which raised suspicion of laryngeal carcinoma. ENT, Dr. Fredric Dine consulted. Eliquis held and patient underwent  biopsy 4/27 with results penidng. Some worsening of secretions noted. Began on Robitussin /DM syrup, Mucomyst, albuterol, hypertonic saline and chest PT.   Nephrology consulted 4/28 for hyperkalemia of 7.0. Nephrology recommended Kayexalate, insulin and glucose, sodium bicarb. and repeat BMP. IV lasix given and transitioned tube feeds to Nepro. Baseline renal function Cr 1.3- 1.4. Optimize volume and glucose control. K 5.1 on 4/29. Patient at baseline uses extended release  po meds which he is unable to take po at this time for his Chron's.Admitted to CIR 10/06/2020.  Readmitted to acute [name] on 10/07/2020 after patient dislodged his Cortrak and developed hypoglycemia, encephalopathy and had aspiration PNA. Cortrak replaced by IR on 5/1 and now continues tube feeds. Glucose stabilized. There was concern for superimposed aspiration pneumonia and initially continued with Vanc and Zosyn. Now narrowed down to solely Zosyn. Encephalopathy felt to be due to severe hypoglycemia and now resolved. Continue to follow BMET for AKI  resolved and IV fluids discontinued. Path results from laryngeal BX negative , noted to have reactive changes with chronic inflammation and focal hemorrhage. No malignancy.   Emergent trach exchange on 5/3 with findings of increased inflammatory tissue on the post wall of the trachea with recs to keep distal XLT and possible change trach in 2 weeks per PCCM. They will follow .Still some intermittent tracheomalacia with dynamic collapse of airway noted with cough per PCCM. They will continue to assess weekly. Recommend to be conservative to approach decannulation with plan to work with ENT with possible airway assessment before decannulation with OR assessment of upper supraglottal area as well as sub glottal area.   Past Medical History  Past Medical History:  Diagnosis Date  . [name]-fib ([place])   . Adenocarcinoma of left lung, stage 1 ([place]) 03/10/2015  . Ankylosing spondylitis ([place])    Diagnosed during lumbar fracture summer of 2016    . Crohn's disease ([place])   . Gout   . HIstory of basal cell cancer of face    THYROID CA HX  . History of kidney stones   . Hypertension   . Hypothyroidism   . Impotence   . 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 Obesity (BMI 30-39.9)   . Osteoporosis    Pt completed 5 years of fosamax in 2013     . Prostate cancer with recurrence    Treated with prostatectomy with recurrence 2012 with Lupron treatment now him   . Psoriasis   . Thyroid cancer ([place]) 1999   Treated with RAI and total thyroidectomy   . Type II diabetes mellitus ([facility])     Family History  family history includes CAD in his mother.  Prior Rehab/Hospitalizations:  Has the patient had prior rehab or hospitalizations prior to admission? Yes CIR 4/29 until 09/27/20 and then readmitted to acute  Has the patient had major surgery during 100 days prior to admission? Yes  Current Medications   Current Facility-Administered Medications:  .  acetaminophen (TYLENOL) 160  MG/5ML solution 500 mg, 500 mg, Per Tube, Q6H, Devin [name], Devin A, MD, 500 mg at 10/12/20 0932 .  albuterol (PROVENTIL) (2.5 MG/3ML) 0.083% nebulizer solution 2.5 mg, 2.5 mg, Inhalation, Q6H PRN, Verlon Au, Jai-Gurmukh, MD .  ALPRAZolam Duanne Moron) tablet 0.5 mg, 0.5 mg, Per Tube, BID PRN, Donne Hazel, MD .  apixaban Arne Cleveland) tablet 5 mg, 5 mg, Per Tube, BID, Nita Sells, MD, 5 mg at 10/12/20 0919 .  atenolol (TENORMIN) tablet 25 mg, 25 mg, Per Tube, BID, Nita Sells, MD, 25 mg at 10/12/20 0919 .  budesonide (PULMICORT) nebulizer solution 0.5 mg, 0.5 mg, Nebulization, BID, Devin [name], Devin A, MD, 0.5 mg at 10/12/20 0759 .  chlorhexidine (PERIDEX) 0.12 % solution 15 mL, 15 mL, Mouth/Throat, BID, Devin [name], Devin A, MD, 15 mL at 10/12/20 0919 .  Chlorhexidine Gluconate Cloth 2 % PADS 6 each, 6 each, Topical, Daily, Nita Sells, MD, 6 each at 10/11/20 1000 .  cholestyramine (QUESTRAN) packet 4 g, 4 g, Per Tube, BID AC, Samtani, Jai-Gurmukh, MD, 4 g at 10/12/20 1304 .  diltiazem (CARDIZEM) 10 mg/ml oral suspension 30 mg, 30 mg, Oral, Q8H, Samtani, Jai-Gurmukh, MD, 30 mg at 10/12/20 0539 .  diphenhydrAMINE (BENADRYL) 12.5 MG/5ML elixir 12.5-25 mg, 12.5-25 mg, Per Tube, Q6H PRN, Devin [name], Devin A, MD .  feeding supplement (GLUCERNA 1.5 CAL) liquid 1,000 mL, 1,000 mL, Per Tube, Continuous, Samtani, Jai-Gurmukh, MD, Last Rate: 55 mL/hr at 10/11/20 1510, 1,000 mL at 10/11/20 1510 .  feeding supplement (PROSource TF) liquid 45 mL, 45 mL, Per Tube, Daily, Verlon Au, Jai-Gurmukh, MD, 45 mL at 10/12/20 0918 .  free water 170 mL, 170 mL, Per Tube, Q4H, Samtani, Jai-Gurmukh, MD, 170 mL at 10/12/20 1200 .  Gerhardt's butt cream, , Topical, BID, Norval Morton, MD, Given at 10/11/20 2122 .  insulin aspart (novoLOG) injection 0-9 Units, 0-9 Units, Subcutaneous, Q4H, Nita Sells, MD, 1 Units at 10/12/20 1330 .  lactobacillus (FLORANEX/LACTINEX) granules 1 g, 1 g, Per Tube, TID WC, Samtani,  Jai-Gurmukh, MD, 1 g at 10/12/20 1322 .  levothyroxine (SYNTHROID) tablet 125 mcg, 125 mcg, Per Tube, QAC breakfast, Nita Sells, MD, 125 mcg at 10/12/20 [phone] .  lidocaine (XYLOCAINE) 2 % jelly, , Topical, PRN, Devin [name], Devin A, MD .  lidocaine (XYLOCAINE) 2 % viscous mouth solution 15 mL, 15 mL, Mouth/Throat, PRN, Tamala Julian, Devin A, MD, 2 mL at 10/08/20 1238 .  loperamide HCl (IMODIUM) 1 MG/7.5ML suspension 2 mg, 2 mg, Per Tube, BID PRN, Verlon Au, Jai-Gurmukh, MD .  loperamide HCl (IMODIUM) 1 MG/7.5ML suspension 4 mg, 4 mg, Per Tube, QID, Verlon Au, Jai-Gurmukh, MD, 4 mg at 10/12/20 1305 .  MEDLINE mouth rinse, 15 mL, Mouth Rinse, 10 times per day, Fuller Plan A, MD, 15 mL at 10/12/20 [phone] .  milk and molasses enema, 1 enema, Rectal, Daily PRN, Norval Morton, MD .  nutrition supplement (JUVEN) (JUVEN) powder packet 1 packet, 1 packet, Per Tube, BID BM, Nita Sells, MD, 1 packet at 10/12/20 0919 .  ondansetron (ZOFRAN) injection 4 mg, 4 mg, Intravenous, Q6H PRN, Devin [name], Devin A, MD .  oxyCODONE (ROXICODONE) 5 MG/5ML solution 5 mg, 5 mg, Per Tube, Q4H PRN, Nita Sells, MD, 5 mg at 10/12/20 0720 .  piperacillin-tazobactam (ZOSYN) IVPB 3.375 g, 3.375 g, Intravenous, Q8H, Kris Mouton, [facility], Last Rate: 12.5 mL/hr at 10/12/20 0918, 3.375 g at 10/12/20 0918 .  senna-docusate (Senokot-S) tablet 1 tablet, 1 tablet, Per Tube, QHS PRN, Norval Morton, MD  Patients Current Diet:  Diet Order            Diet NPO time specified  Diet effective now                 Precautions / Restrictions Precautions Precautions: Fall,Back Precaution Booklet Issued: No Precaution Comments: rectal tube, trach collar, cortrak, TLSO Spinal Brace: Thoracolumbosacral orthotic,Applied in sitting position Restrictions Weight Bearing Restrictions: No   Has the patient had 2 or more falls or [name] fall with injury in the past year?No  Prior Activity Level Community (5-7x/wk): independent,  active and driving  Prior Functional Level Prior Function Level of Independence: Independent  Self Care: Did the patient need help bathing, dressing, using the toilet or eating?  Independent  Indoor Mobility: Did the patient need assistance with walking from room to room (with or without device)? Independent  Stairs: Did the patient need assistance with internal or external stairs (with or without device)? Independent  Functional Cognition: Did the patient need help planning regular tasks such as shopping or remembering to take medications? Independent  Home Assistive Devices / Equipment Home Equipment: Walker - 2 wheels,Bedside commode  Prior Device Use: Indicate devices/aids used by the patient prior to current illness, exacerbation or injury? None of the above  Current Functional Level Cognition  Overall Cognitive Status: Difficult to assess Difficult to assess due to: Tracheostomy Orientation Level: Intubated/Tracheostomy - Unable to assess Following Commands: Follows one step commands with increased time General Comments: Pt with flat affect, seems related to fatigue; endorses frustration related to [name] admission. Family present and reports "he looks really good today" (more alert and interactive)    Extremity Assessment (includes Sensation/Coordination)  Upper Extremity Assessment: Defer to OT evaluation RUE Deficits / Details: strength 3+/5 LUE Deficits / Details: strength 3+/5  Lower Extremity Assessment: Generalized weakness RLE Deficits / Details: grossly 3/5 RLE Coordination: decreased gross motor LLE Deficits / Details: grossly 3/5 LLE Coordination: decreased gross motor    ADLs  Overall ADL's : Needs assistance/impaired Eating/Feeding: NPO Grooming: Minimal assistance,Sitting Upper Body Bathing: Moderate assistance,Sitting Lower Body Bathing: Maximal assistance,Sitting/lateral leans,Bed level,Cueing for safety Upper Body Dressing : Maximal  assistance,Sitting Upper Body Dressing Details (indicate cue type and reason): Pt partially donning brace. Requiring Max [name] for positioning and stabilizing brace Lower Body Dressing: Maximal assistance,Cueing for safety,Sitting/lateral leans,Sit to/from stand Toilet Transfer: Minimal assistance,+2 for safety/equipment,Ambulation,RW Toilet Transfer Details (indicate cue type and reason): MinA for support and for line management. Toileting- Clothing Manipulation and Hygiene: Total assistance,Sitting/lateral lean,Sit to/from stand Toileting - Clothing Manipulation Details (indicate cue type and reason): has catheter and rectal tube Functional mobility during ADLs: +2 for physical assistance,Minimal assistance,Cueing for safety,Rolling walker General ADL Comments: Pt limited by decreased strength, endurance for transferring to recliner and poor ability to care for self without extensive assist.    Mobility  Overal bed mobility: Needs Assistance Bed Mobility: Rolling,Sidelying to Sit Rolling: Supervision Sidelying to sit: HOB elevated,Min guard General bed mobility comments: Received sitting EOB with RN/NT    Transfers  Overall transfer level: Needs assistance Equipment used: Rolling walker (2 wheeled) Transfers: Sit to/from Stand Sit to Stand: Min assist,Mod assist Stand pivot transfers: Min guard General transfer comment: Pt reliant on minA to power into standing from elevated bed height; modA for trunk elevation and eccentric control with sit<>stand from recliner, heavy reliance on UE support    Ambulation / Gait / Stairs / Wheelchair Mobility  Ambulation/Gait Ambulation/Gait assistance: Min assist,+2 safety/equipment,Mod assist Gait Distance (Feet): 4 Feet Assistive device: Rolling walker (2 wheeled) Gait Pattern/deviations: Step-to pattern,Trunk flexed General Gait Details: Short ambulation distance from bed to recliner with RW, min-modA for stability; noted bilateral knee instabiltiy  and pt c/o back pain; pt declined further distance secondary to fatigue, but agreeable to additional standing trials after prolonged seated rest Gait velocity: Decreased Gait velocity interpretation: <1.31 ft/sec, indicative of household ambulator    Posture / Balance Balance Overall balance assessment: Needs assistance Sitting-balance support: Feet supported Sitting balance-Leahy Scale: Fair Standing balance support: Bilateral upper extremity supported,During functional activity Standing balance-Leahy Scale: Poor Standing balance comment: Reliant on UE support and external assist; (+) LOB with minimal challenge to balance    Special needs/care consideration Rectal tube; history of Chron's and unable to take his home po med regimen/Asacol [place] XLT distal 6 mm uncuffed distal placed 5/3 by PCCM NG 10 fr right nare 72 cm placed 5/1 claustrophobic Anxious at baseline and uses xanax prn  When calls to front desk, ask that staff respond in timely manner Chest PT with respiratory therapy He is claustrophobic   Previous Home Environment  Living Arrangements: Spouse/significant other  Lives With: Spouse Available Help at Discharge: Family,Available 24 hours/day Type of Home: House Home Layout: One level Home Access: Stairs to enter [employer] Shower/Tub: Multimedia programmer: Handicapped height Bathroom Accessibility: Yes [place]: No Additional Comments: daughter present at time of this eval.  Discharge Living Setting Plans for Discharge Living Setting: Patient's home,Lives with (comment) Type of Home at Discharge: House Discharge Home Layout: One level Discharge Home Access: Stairs to enter Entrance Stairs-Rails: Right Entrance Stairs-Number of Steps: 3 Discharge Bathroom Shower/Tub: Walk-in shower Discharge Bathroom Toilet: Handicapped height Discharge Bathroom Accessibility: Yes How Accessible: Accessible via walker Does the patient have any problems  obtaining your medications?: No  Social/Family/Support Systems Contact Information: wife and 3 daughters Anticipated Caregiver: wife and family Anticipated Caregiver's Contact Information: see above Ability/Limitations of Caregiver: none Caregiver Availability: 24/7 Discharge Plan Discussed with Primary Caregiver: Yes Is Caregiver In Agreement with Plan?: Yes Does Caregiver/Family have Issues with Lodging/Transportation while Pt is in Rehab?: No  Goals Patient/Family Goal for Rehab: supervision to min asisst PT, OT and SLP Expected length of stay: ELOS 2 to 3 weeks Pt/Family Agrees to Admission and willing to participate: Yes Program Orientation Provided & Reviewed with Pt/Caregiver Including Roles  & Responsibilities: Yes  Decrease burden of Care through IP rehab admission: n/[name]  Possible need for SNF placement upon discharge:not anticipated  Patient Condition: This patient's medical and functional status has changed since the consult dated: 09/27/2020 in which the Rehabilitation Physician determined and documented that the patient's condition is appropriate for intensive rehabilitative care in an inpatient rehabilitation facility. See "History of Present Illness" (above) for medical update. Functional changes are: mod assist. Patient's medical and functional status update has been discussed with the Rehabilitation physician and patient remains appropriate for inpatient rehabilitation. Will admit to inpatient rehab today.  Preadmission Screen Completed By:  Cleatrice Burke, RN, 10/12/2020 2:06 PM ______________________________________________________________________   Discussed status with Dr. Dagoberto Ligas on 10/12/2020 at 1406 and received approval for admission today.  Admission Coordinator:  Cleatrice Burke, time 1117 Date 10/12/2020

## 2020-10-11 NOTE — TOC Progression Note (Signed)
Transition of Care [facility]) - Progression Note    Patient Details  Name: Devin [name] MRN: [id number] Date of Birth: 1940/08/31  Transition of [facility]) CM/SW Contact  Angelita Ingles, RN Phone Number: [phone]  10/11/2020, 12:20 PM  Clinical Narrative:    MD inquiring if patient is able to discharge to CIR today. CM has sent message to [place] at [facility]. Per Pamala Hurry patient is not able to return to CIR today due to insurance auth withdrawal yesterday due to acute medical issues. Pamala Hurry will initiate new authorization and may possibly be able to accept the patient tomorrow 5/5. TOC will continue to follow.         Expected Discharge Plan and Services                                                 Social Determinants of Health (SDOH) Interventions    Readmission Risk Interventions No flowsheet data found.

## 2020-10-11 NOTE — Progress Notes (Signed)
Physical Therapy Treatment Patient Details Name: Devin [name] MRN: [id number] DOB: 1941-05-27 Today's Date: 10/11/2020    History of Present Illness Pt is a 80 yo male recently admitted 09/10/20-10/06/20 after fall, then transferred to [facility] 10/06/20; readmitted to [facility] acute care on 10/07/20 for low blood sugars. Pt has had difficulty with cortak placement. On recent admit 09/10/20, pt with SOB after fall on 3/31 which resulted in T11 Chance fracture as well as stable L1 compression fx.  While in the ED patient required intubation acute on chronic hypercarbic respiratory failure.  Neurosurgery performed posterior fusion of T11-T12 on 4/8. Trach placed 4/12. Rectal tube placement from Crohn's dx. Other PMH includes HTN, gout, afib, ankylosing spondylitis, osteoporosis, prostate CA, DM2.   PT Comments    Pt progressing with mobility. Tolerated multiple sit<>stand transfers and pre-gait activity, including short ambulation distance, requiring BUE support and up to [facility] for stability. Pt remains limited by generalized weakness, decreased activity tolerance and impaired balance strategies/postural reactions. Pt's family present and supportive. Pt remains an excellent candidate for intensive CIR-level therapies to maximize functional mobility and independence prior to return home. Will continue to follow acutely.   Follow Up Recommendations  CIR;Supervision for mobility/OOB     Equipment Recommendations  3in1 (PT);Rolling walker with 5" wheels    Recommendations for Other Services Rehab consult     Precautions / Restrictions Precautions Precautions: Fall;Back Precaution Comments: rectal tube, trach collar, cortrak, TLSO Required Braces or Orthoses: Spinal Brace Spinal Brace: Thoracolumbosacral orthotic;Applied in sitting position Restrictions Weight Bearing Restrictions: No    Mobility  Bed Mobility               General bed mobility comments: Received sitting EOB with RN/NT     Transfers Overall transfer level: Needs assistance Equipment used: Rolling walker (2 wheeled) Transfers: Sit to/from Stand Sit to Stand: Min assist;Mod assist         General transfer comment: Pt reliant on minA to power into standing from elevated bed height; modA for trunk elevation and eccentric control with sit<>stand from recliner, heavy reliance on UE support  Ambulation/Gait Ambulation/Gait assistance: Min assist;+2 safety/equipment;Mod assist Gait Distance (Feet): 4 Feet Assistive device: Rolling walker (2 wheeled) Gait Pattern/deviations: Step-to pattern;Trunk flexed Gait velocity: Decreased Gait velocity interpretation: <1.31 ft/sec, indicative of household ambulator General Gait Details: Short ambulation distance from bed to recliner with RW, min-modA for stability; noted bilateral knee instabiltiy and pt c/o back pain; pt declined further distance secondary to fatigue, but agreeable to additional standing trials after prolonged seated rest   Stairs             Wheelchair Mobility    Modified Rankin (Stroke Patients Only)       Balance Overall balance assessment: Needs assistance Sitting-balance support: Feet supported Sitting balance-Leahy Scale: Fair     Standing balance support: Bilateral upper extremity supported;During functional activity Standing balance-Leahy Scale: Poor Standing balance comment: Reliant on UE support and external assist; (+) LOB with minimal challenge to balance; dependent for posterior pericare                            Cognition Arousal/Alertness: Awake/alert Behavior During Therapy: Flat affect Overall Cognitive Status: Difficult to assess Area of Impairment: Following commands                       Following Commands: Follows one step commands with increased time  General Comments: Pt with flat affect, seems related to fatigue; endorses frustration related to hospital admission. Family  present and reports "he looks really good today" (more alert and interactive)      Exercises General Exercises - Lower Extremity Long Arc Quad: AROM;Both;Seated    General Comments General comments (skin integrity, edema, etc.): Pt's wife and daughter present, supportive. SpO2 91% on O2 via trach collar      Pertinent Vitals/Pain Pain Assessment: Faces Faces Pain Scale: Hurts a little bit Pain Location: generalized; lower back with transitions Pain Descriptors / Indicators: Grimacing Pain Intervention(s): Monitored during session;Repositioned    Home Living                      Prior Function            PT Goals (current goals can now be found in the care plan section) Progress towards PT goals: Progressing toward goals    Frequency    Min 3X/week      PT Plan Current plan remains appropriate    Co-evaluation              AM-PAC PT "6 Clicks" Mobility   Outcome Measure  Help needed turning from your back to your side while in a flat bed without using bedrails?: A Little Help needed moving from lying on your back to sitting on the side of a flat bed without using bedrails?: A Little Help needed moving to and from a bed to a chair (including a wheelchair)?: A Lot Help needed standing up from a chair using your arms (e.g., wheelchair or bedside chair)?: A Lot Help needed to walk in hospital room?: A Lot Help needed climbing 3-5 steps with a railing? : A Lot 6 Click Score: 14    End of Session Equipment Utilized During Treatment: Oxygen;Back brace Activity Tolerance: Patient tolerated treatment well;Patient limited by fatigue Patient left: in chair;with call bell/phone within reach;with chair alarm set;with family/visitor present Nurse Communication: Mobility status PT Visit Diagnosis: Pain;Difficulty in walking, not elsewhere classified (R26.2);Unsteadiness on feet (R26.81);Other abnormalities of gait and mobility (R26.89);Muscle weakness  (generalized) (M62.81) Pain - Right/Left: Right     Time: 9937-1696 PT Time Calculation (min) (ACUTE ONLY): 30 min  Charges:  $Therapeutic Activity: 23-37 mins                     Mabeline Caras, PT, DPT Acute Rehabilitation Services  Pager [phone] Office [place] 10/11/2020, 12:50 PM

## 2020-10-12 ENCOUNTER — Inpatient Hospital Stay (HOSPITAL_COMMUNITY)
Admission: RE | Admit: 2020-10-12 | Discharge: 2020-10-31 | DRG: 559 | Disposition: A | Discharge status: Other Acute Inpatient Hospital | Payer: Medicare Other | Source: Intra-hospital | Attending: Physical Medicine & Rehabilitation | Admitting: Physical Medicine & Rehabilitation

## 2020-10-12 DIAGNOSIS — E1165 Type 2 diabetes mellitus with hyperglycemia: Secondary | ICD-10-CM | POA: Diagnosis not present

## 2020-10-12 DIAGNOSIS — S22080A Wedge compression fracture of T11-T12 vertebra, initial encounter for closed fracture: Secondary | ICD-10-CM | POA: Diagnosis not present

## 2020-10-12 DIAGNOSIS — Z7951 Long term (current) use of inhaled steroids: Secondary | ICD-10-CM

## 2020-10-12 DIAGNOSIS — I10 Essential (primary) hypertension: Secondary | ICD-10-CM | POA: Diagnosis present

## 2020-10-12 DIAGNOSIS — R5381 Other malaise: Secondary | ICD-10-CM | POA: Diagnosis present

## 2020-10-12 DIAGNOSIS — W19XXXD Unspecified fall, subsequent encounter: Secondary | ICD-10-CM | POA: Diagnosis present

## 2020-10-12 DIAGNOSIS — Z8249 Family history of ischemic heart disease and other diseases of the circulatory system: Secondary | ICD-10-CM

## 2020-10-12 DIAGNOSIS — J9621 Acute and chronic respiratory failure with hypoxia: Secondary | ICD-10-CM | POA: Diagnosis present

## 2020-10-12 DIAGNOSIS — D62 Acute posthemorrhagic anemia: Secondary | ICD-10-CM

## 2020-10-12 DIAGNOSIS — E890 Postprocedural hypothyroidism: Secondary | ICD-10-CM | POA: Diagnosis not present

## 2020-10-12 DIAGNOSIS — R579 Shock, unspecified: Secondary | ICD-10-CM | POA: Diagnosis present

## 2020-10-12 DIAGNOSIS — S22080S Wedge compression fracture of T11-T12 vertebra, sequela: Secondary | ICD-10-CM | POA: Diagnosis not present

## 2020-10-12 DIAGNOSIS — C3492 Malignant neoplasm of unspecified part of left bronchus or lung: Secondary | ICD-10-CM | POA: Diagnosis not present

## 2020-10-12 DIAGNOSIS — J90 Pleural effusion, not elsewhere classified: Secondary | ICD-10-CM | POA: Diagnosis present

## 2020-10-12 DIAGNOSIS — J9601 Acute respiratory failure with hypoxia: Secondary | ICD-10-CM | POA: Diagnosis not present

## 2020-10-12 DIAGNOSIS — R1310 Dysphagia, unspecified: Secondary | ICD-10-CM

## 2020-10-12 DIAGNOSIS — M810 Age-related osteoporosis without current pathological fracture: Secondary | ICD-10-CM | POA: Diagnosis present

## 2020-10-12 DIAGNOSIS — Z79899 Other long term (current) drug therapy: Secondary | ICD-10-CM

## 2020-10-12 DIAGNOSIS — Z930 Tracheostomy status: Secondary | ICD-10-CM | POA: Diagnosis not present

## 2020-10-12 DIAGNOSIS — R339 Retention of urine, unspecified: Secondary | ICD-10-CM

## 2020-10-12 DIAGNOSIS — T8119XA Other postprocedural shock, initial encounter: Secondary | ICD-10-CM | POA: Diagnosis not present

## 2020-10-12 DIAGNOSIS — I4811 Longstanding persistent atrial fibrillation: Secondary | ICD-10-CM | POA: Diagnosis not present

## 2020-10-12 DIAGNOSIS — Z85118 Personal history of other malignant neoplasm of bronchus and lung: Secondary | ICD-10-CM | POA: Diagnosis not present

## 2020-10-12 DIAGNOSIS — Z87891 Personal history of nicotine dependence: Secondary | ICD-10-CM

## 2020-10-12 DIAGNOSIS — Z902 Acquired absence of lung [part of]: Secondary | ICD-10-CM | POA: Diagnosis not present

## 2020-10-12 DIAGNOSIS — Z85828 Personal history of other malignant neoplasm of skin: Secondary | ICD-10-CM

## 2020-10-12 DIAGNOSIS — K5090 Crohn's disease, unspecified, without complications: Secondary | ICD-10-CM | POA: Diagnosis present

## 2020-10-12 DIAGNOSIS — J9602 Acute respiratory failure with hypercapnia: Secondary | ICD-10-CM | POA: Diagnosis not present

## 2020-10-12 DIAGNOSIS — S22080D Wedge compression fracture of T11-T12 vertebra, subsequent encounter for fracture with routine healing: Secondary | ICD-10-CM | POA: Diagnosis not present

## 2020-10-12 DIAGNOSIS — Z794 Long term (current) use of insulin: Secondary | ICD-10-CM | POA: Diagnosis not present

## 2020-10-12 DIAGNOSIS — J690 Pneumonitis due to inhalation of food and vomit: Secondary | ICD-10-CM | POA: Diagnosis not present

## 2020-10-12 DIAGNOSIS — E860 Dehydration: Secondary | ICD-10-CM | POA: Diagnosis not present

## 2020-10-12 DIAGNOSIS — R195 Other fecal abnormalities: Secondary | ICD-10-CM | POA: Diagnosis not present

## 2020-10-12 DIAGNOSIS — E871 Hypo-osmolality and hyponatremia: Secondary | ICD-10-CM

## 2020-10-12 DIAGNOSIS — L89141 Pressure ulcer of left lower back, stage 1: Secondary | ICD-10-CM | POA: Diagnosis present

## 2020-10-12 DIAGNOSIS — Z4659 Encounter for fitting and adjustment of other gastrointestinal appliance and device: Secondary | ICD-10-CM

## 2020-10-12 DIAGNOSIS — Z8546 Personal history of malignant neoplasm of prostate: Secondary | ICD-10-CM

## 2020-10-12 DIAGNOSIS — E1129 Type 2 diabetes mellitus with other diabetic kidney complication: Secondary | ICD-10-CM | POA: Diagnosis present

## 2020-10-12 DIAGNOSIS — Z85850 Personal history of malignant neoplasm of thyroid: Secondary | ICD-10-CM

## 2020-10-12 DIAGNOSIS — S22000A Wedge compression fracture of unspecified thoracic vertebra, initial encounter for closed fracture: Secondary | ICD-10-CM | POA: Diagnosis present

## 2020-10-12 DIAGNOSIS — J449 Chronic obstructive pulmonary disease, unspecified: Secondary | ICD-10-CM | POA: Diagnosis present

## 2020-10-12 DIAGNOSIS — I951 Orthostatic hypotension: Secondary | ICD-10-CM | POA: Diagnosis not present

## 2020-10-12 DIAGNOSIS — E1122 Type 2 diabetes mellitus with diabetic chronic kidney disease: Secondary | ICD-10-CM | POA: Diagnosis not present

## 2020-10-12 DIAGNOSIS — Z515 Encounter for palliative care: Secondary | ICD-10-CM | POA: Diagnosis not present

## 2020-10-12 DIAGNOSIS — G9341 Metabolic encephalopathy: Secondary | ICD-10-CM | POA: Diagnosis not present

## 2020-10-12 DIAGNOSIS — M109 Gout, unspecified: Secondary | ICD-10-CM | POA: Diagnosis present

## 2020-10-12 DIAGNOSIS — Z7901 Long term (current) use of anticoagulants: Secondary | ICD-10-CM

## 2020-10-12 DIAGNOSIS — Z95828 Presence of other vascular implants and grafts: Secondary | ICD-10-CM

## 2020-10-12 DIAGNOSIS — E1142 Type 2 diabetes mellitus with diabetic polyneuropathy: Secondary | ICD-10-CM | POA: Diagnosis not present

## 2020-10-12 DIAGNOSIS — R0902 Hypoxemia: Secondary | ICD-10-CM

## 2020-10-12 DIAGNOSIS — F419 Anxiety disorder, unspecified: Secondary | ICD-10-CM | POA: Diagnosis present

## 2020-10-12 DIAGNOSIS — I480 Paroxysmal atrial fibrillation: Secondary | ICD-10-CM | POA: Diagnosis present

## 2020-10-12 DIAGNOSIS — M459 Ankylosing spondylitis of unspecified sites in spine: Secondary | ICD-10-CM | POA: Diagnosis present

## 2020-10-12 DIAGNOSIS — Z79890 Hormone replacement therapy (postmenopausal): Secondary | ICD-10-CM

## 2020-10-12 DIAGNOSIS — S22088D Other fracture of T11-T12 vertebra, subsequent encounter for fracture with routine healing: Principal | ICD-10-CM

## 2020-10-12 DIAGNOSIS — R197 Diarrhea, unspecified: Secondary | ICD-10-CM | POA: Diagnosis not present

## 2020-10-12 DIAGNOSIS — L89131 Pressure ulcer of right lower back, stage 1: Secondary | ICD-10-CM | POA: Diagnosis present

## 2020-10-12 DIAGNOSIS — G9340 Encephalopathy, unspecified: Secondary | ICD-10-CM | POA: Diagnosis not present

## 2020-10-12 DIAGNOSIS — N183 Chronic kidney disease, stage 3 unspecified: Secondary | ICD-10-CM | POA: Diagnosis not present

## 2020-10-12 DIAGNOSIS — G479 Sleep disorder, unspecified: Secondary | ICD-10-CM

## 2020-10-12 DIAGNOSIS — Z978 Presence of other specified devices: Secondary | ICD-10-CM

## 2020-10-12 DIAGNOSIS — R1314 Dysphagia, pharyngoesophageal phase: Secondary | ICD-10-CM | POA: Diagnosis not present

## 2020-10-12 DIAGNOSIS — J9612 Chronic respiratory failure with hypercapnia: Secondary | ICD-10-CM | POA: Diagnosis present

## 2020-10-12 DIAGNOSIS — T17908A Unspecified foreign body in respiratory tract, part unspecified causing other injury, initial encounter: Secondary | ICD-10-CM | POA: Diagnosis not present

## 2020-10-12 DIAGNOSIS — J386 Stenosis of larynx: Secondary | ICD-10-CM | POA: Diagnosis present

## 2020-10-12 DIAGNOSIS — R1312 Dysphagia, oropharyngeal phase: Secondary | ICD-10-CM | POA: Diagnosis not present

## 2020-10-12 LAB — CBC WITH DIFFERENTIAL/PLATELET
Abs Immature Granulocytes: 0.75 10*3/uL — ABNORMAL HIGH (ref 0.00–0.07)
Basophils Absolute: 0.1 10*3/uL (ref 0.0–0.1)
Basophils Relative: 1 %
Eosinophils Absolute: 0.4 10*3/uL (ref 0.0–0.5)
Eosinophils Relative: 2 %
HCT: 29.3 % — ABNORMAL LOW (ref 39.0–52.0)
Hemoglobin: 8.8 g/dL — ABNORMAL LOW (ref 13.0–17.0)
Immature Granulocytes: 5 %
Lymphocytes Relative: 10 %
Lymphs Abs: 1.6 10*3/uL (ref 0.7–4.0)
MCH: 29.9 pg (ref 26.0–34.0)
MCHC: 30 g/dL (ref 30.0–36.0)
MCV: 99.7 fL (ref 80.0–100.0)
Monocytes Absolute: 1.2 10*3/uL — ABNORMAL HIGH (ref 0.1–1.0)
Monocytes Relative: 8 %
Neutro Abs: 12.4 10*3/uL — ABNORMAL HIGH (ref 1.7–7.7)
Neutrophils Relative %: 74 %
Platelets: 449 10*3/uL — ABNORMAL HIGH (ref 150–400)
RBC: 2.94 MIL/uL — ABNORMAL LOW (ref 4.22–5.81)
RDW: 15.2 % (ref 11.5–15.5)
WBC: 16.4 10*3/uL — ABNORMAL HIGH (ref 4.0–10.5)
nRBC: 0 % (ref 0.0–0.2)

## 2020-10-12 LAB — COMPREHENSIVE METABOLIC PANEL
ALT: 9 U/L (ref 0–44)
AST: 16 U/L (ref 15–41)
Albumin: 2.3 g/dL — ABNORMAL LOW (ref 3.5–5.0)
Alkaline Phosphatase: 107 U/L (ref 38–126)
Anion gap: 6 (ref 5–15)
BUN: 31 mg/dL — ABNORMAL HIGH (ref 8–23)
CO2: 33 mmol/L — ABNORMAL HIGH (ref 22–32)
Calcium: 8.7 mg/dL — ABNORMAL LOW (ref 8.9–10.3)
Chloride: 96 mmol/L — ABNORMAL LOW (ref 98–111)
Creatinine, Ser: 0.9 mg/dL (ref 0.61–1.24)
GFR, Estimated: >60 mL/min (ref >60)
Glucose, Bld: 121 mg/dL — ABNORMAL HIGH (ref 70–99)
Potassium: 4.4 mmol/L (ref 3.5–5.1)
Sodium: 135 mmol/L (ref 135–145)
Total Bilirubin: 0.4 mg/dL (ref 0.3–1.2)
Total Protein: 5.5 g/dL — ABNORMAL LOW (ref 6.5–8.1)

## 2020-10-12 LAB — GLUCOSE, CAPILLARY
Glucose-Capillary: 109 mg/dL — ABNORMAL HIGH (ref 70–99)
Glucose-Capillary: 122 mg/dL — ABNORMAL HIGH (ref 70–99)
Glucose-Capillary: 142 mg/dL — ABNORMAL HIGH (ref 70–99)
Glucose-Capillary: 144 mg/dL — ABNORMAL HIGH (ref 70–99)
Glucose-Capillary: 149 mg/dL — ABNORMAL HIGH (ref 70–99)
Glucose-Capillary: 154 mg/dL — ABNORMAL HIGH (ref 70–99)
Glucose-Capillary: 157 mg/dL — ABNORMAL HIGH (ref 70–99)

## 2020-10-12 MED ORDER — LIDOCAINE HCL URETHRAL/MUCOSAL 2 % EX GEL
CUTANEOUS | Status: DC | PRN
  Administered 2020-10-23: 1 via TOPICAL
  Filled 2020-10-12: qty 5

## 2020-10-12 MED ORDER — FREE WATER
170.0000 mL | Freq: Four times a day (QID) | Status: DC
  Administered 2020-10-13 (×2): 170 mL

## 2020-10-12 MED ORDER — TRAMADOL 5 MG/ML ORAL SUSPENSION: 25.0000 mg | Freq: Four times a day (QID) | ORAL | Status: DC | PRN

## 2020-10-12 MED ORDER — MILK AND MOLASSES ENEMA
1.0000 | Freq: Every day | RECTAL | Status: DC | PRN
  Filled 2020-10-12: qty 240

## 2020-10-12 MED ORDER — BISACODYL 10 MG RE SUPP: 10.0000 mg | Freq: Every day | RECTAL | Status: DC | PRN

## 2020-10-12 MED ORDER — PANCRELIPASE (LIP-PROT-AMYL) 10440-39150 UNITS PO TABS
20880.0000 [IU] | ORAL_TABLET | Freq: Once | ORAL | Status: DC
  Filled 2020-10-12: qty 2

## 2020-10-12 MED ORDER — GLUCERNA 1.5 CAL PO LIQD
1000.0000 mL | ORAL | Status: DC
  Administered 2020-10-12: 1000 mL
  Filled 2020-10-12 (×2): qty 1000

## 2020-10-12 MED ORDER — GERHARDT'S BUTT CREAM
TOPICAL_CREAM | Freq: Two times a day (BID) | CUTANEOUS | Status: DC
  Administered 2020-10-21 – 2020-10-30 (×6): 1 via TOPICAL

## 2020-10-12 MED ORDER — PIPERACILLIN-TAZOBACTAM 3.375 G IVPB
3.3750 g | Freq: Three times a day (TID) | INTRAVENOUS | Status: AC
  Administered 2020-10-13: 3.375 g via INTRAVENOUS
  Filled 2020-10-12: qty 50

## 2020-10-12 MED ORDER — PROSOURCE TF PO LIQD
45.0000 mL | Freq: Every day | ORAL | Status: DC
  Administered 2020-10-13: 45 mL
  Filled 2020-10-12: qty 45

## 2020-10-12 MED ORDER — HYDROCODONE-ACETAMINOPHEN 5-325 MG PO TABS
1.0000 | ORAL_TABLET | ORAL | Status: DC | PRN
Start: 2020-10-12 — End: 2020-10-31
  Administered 2020-10-13 – 2020-10-28 (×7): 1
  Filled 2020-10-12 (×7): qty 1

## 2020-10-12 MED ORDER — ALPRAZOLAM 0.5 MG PO TABS
0.5000 mg | ORAL_TABLET | Freq: Two times a day (BID) | ORAL | Status: DC | PRN
  Administered 2020-10-14 – 2020-10-30 (×7): 0.5 mg
  Filled 2020-10-12 (×2): qty 1
  Filled 2020-10-12: qty 2
  Filled 2020-10-12 (×4): qty 1

## 2020-10-12 MED ORDER — DOXAZOSIN MESYLATE 1 MG PO TABS
1.0000 mg | ORAL_TABLET | Freq: Every day | ORAL | Status: DC
  Administered 2020-10-13 – 2020-10-30 (×18): 1 mg
  Filled 2020-10-12 (×20): qty 1

## 2020-10-12 MED ORDER — JUVEN PO PACK
1.0000 | PACK | Freq: Two times a day (BID) | ORAL | Status: DC
  Administered 2020-10-13 – 2020-10-30 (×32): 1
  Filled 2020-10-12 (×29): qty 1

## 2020-10-12 MED ORDER — GUAIFENESIN-DM 100-10 MG/5ML PO SYRP: 5.0000 mL | ORAL_SOLUTION | Freq: Four times a day (QID) | ORAL | Status: DC | PRN

## 2020-10-12 MED ORDER — LIDOCAINE VISCOUS HCL 2 % MT SOLN
15.0000 mL | OROMUCOSAL | Status: DC | PRN
  Filled 2020-10-12: qty 15

## 2020-10-12 MED ORDER — ACETAMINOPHEN 325 MG PO TABS
325.0000 mg | ORAL_TABLET | ORAL | Status: DC | PRN
  Administered 2020-10-13: 325 mg
  Administered 2020-10-14 – 2020-10-22 (×5): 650 mg
  Filled 2020-10-12 (×6): qty 2

## 2020-10-12 MED ORDER — ALBUTEROL SULFATE (2.5 MG/3ML) 0.083% IN NEBU: 2.5000 mg | INHALATION_SOLUTION | Freq: Four times a day (QID) | RESPIRATORY_TRACT | Status: DC | PRN

## 2020-10-12 MED ORDER — PIPERACILLIN-TAZOBACTAM 3.375 G IVPB: 3.3750 g | Freq: Three times a day (TID) | INTRAVENOUS | Status: DC

## 2020-10-12 MED ORDER — TAMSULOSIN HCL 0.4 MG PO CAPS
0.4000 mg | ORAL_CAPSULE | Freq: Every day | ORAL | Status: DC
  Administered 2020-10-12: 0.4 mg via ORAL
  Filled 2020-10-12: qty 1

## 2020-10-12 MED ORDER — CHLORHEXIDINE GLUCONATE CLOTH 2 % EX PADS
6.0000 | MEDICATED_PAD | Freq: Every day | CUTANEOUS | Status: DC
  Administered 2020-10-14 – 2020-10-30 (×13): 6 via TOPICAL

## 2020-10-12 MED ORDER — ALUM & MAG HYDROXIDE-SIMETH 200-200-20 MG/5ML PO SUSP
30.0000 mL | ORAL | Status: DC | PRN
  Administered 2020-10-27: 30 mL
  Filled 2020-10-12: qty 30

## 2020-10-12 MED ORDER — INSULIN ASPART 100 UNIT/ML IJ SOLN
0.0000 [IU] | INTRAMUSCULAR | Status: DC
  Administered 2020-10-13: 2 [IU] via SUBCUTANEOUS
  Administered 2020-10-13: 1 [IU] via SUBCUTANEOUS
  Administered 2020-10-13 (×3): 2 [IU] via SUBCUTANEOUS
  Administered 2020-10-14 (×2): 1 [IU] via SUBCUTANEOUS
  Administered 2020-10-14 (×2): 2 [IU] via SUBCUTANEOUS
  Administered 2020-10-14 – 2020-10-16 (×6): 1 [IU] via SUBCUTANEOUS
  Administered 2020-10-16: 2 [IU] via SUBCUTANEOUS
  Administered 2020-10-16 – 2020-10-17 (×3): 1 [IU] via SUBCUTANEOUS
  Administered 2020-10-17: 2 [IU] via SUBCUTANEOUS
  Administered 2020-10-17 – 2020-10-18 (×5): 1 [IU] via SUBCUTANEOUS
  Administered 2020-10-18: 2 [IU] via SUBCUTANEOUS
  Administered 2020-10-18 – 2020-10-19 (×3): 1 [IU] via SUBCUTANEOUS
  Administered 2020-10-19: 2 [IU] via SUBCUTANEOUS
  Administered 2020-10-19 (×2): 1 [IU] via SUBCUTANEOUS
  Administered 2020-10-20 (×4): 2 [IU] via SUBCUTANEOUS
  Administered 2020-10-20: 1 [IU] via SUBCUTANEOUS
  Administered 2020-10-20 – 2020-10-24 (×25): 2 [IU] via SUBCUTANEOUS
  Administered 2020-10-25: 1 [IU] via SUBCUTANEOUS
  Administered 2020-10-25 (×4): 2 [IU] via SUBCUTANEOUS
  Administered 2020-10-25: 1 [IU] via SUBCUTANEOUS
  Administered 2020-10-26 (×3): 2 [IU] via SUBCUTANEOUS
  Administered 2020-10-26: 1 [IU] via SUBCUTANEOUS
  Administered 2020-10-26 – 2020-10-27 (×8): 2 [IU] via SUBCUTANEOUS
  Administered 2020-10-28: 1 [IU] via SUBCUTANEOUS
  Administered 2020-10-28 – 2020-10-30 (×12): 2 [IU] via SUBCUTANEOUS
  Administered 2020-10-30: 3 [IU] via SUBCUTANEOUS
  Administered 2020-10-30 (×2): 1 [IU] via SUBCUTANEOUS
  Administered 2020-10-30 (×2): 2 [IU] via SUBCUTANEOUS

## 2020-10-12 MED ORDER — ONDANSETRON HCL 4 MG/2ML IJ SOLN: 4.0000 mg | Freq: Four times a day (QID) | INTRAMUSCULAR | Status: DC | PRN

## 2020-10-12 MED ORDER — APIXABAN 5 MG PO TABS
5.0000 mg | ORAL_TABLET | Freq: Two times a day (BID) | ORAL | Status: DC
  Administered 2020-10-12 – 2020-10-26 (×28): 5 mg
  Filled 2020-10-12 (×28): qty 1

## 2020-10-12 MED ORDER — CHOLESTYRAMINE 4 G PO PACK
4.0000 g | PACK | Freq: Two times a day (BID) | ORAL | Status: DC
  Administered 2020-10-13 – 2020-10-18 (×12): 4 g
  Filled 2020-10-12 (×14): qty 1

## 2020-10-12 MED ORDER — FLORANEX PO PACK: 1.0000 g | PACK | Freq: Three times a day (TID) | ORAL | Status: DC

## 2020-10-12 MED ORDER — ATENOLOL 25 MG PO TABS
25.0000 mg | ORAL_TABLET | Freq: Two times a day (BID) | ORAL | Status: DC
  Administered 2020-10-12 – 2020-10-30 (×37): 25 mg
  Filled 2020-10-12 (×37): qty 1

## 2020-10-12 MED ORDER — DILTIAZEM 12 MG/ML ORAL SUSPENSION
30.0000 mg | Freq: Three times a day (TID) | ORAL | Status: DC
  Administered 2020-10-12 – 2020-10-30 (×53): 30 mg
  Filled 2020-10-12 (×4): qty 3
  Filled 2020-10-12: qty 6
  Filled 2020-10-12 (×3): qty 3
  Filled 2020-10-12: qty 6
  Filled 2020-10-12 (×47): qty 3
  Filled 2020-10-12: qty 6
  Filled 2020-10-12 (×2): qty 3
  Filled 2020-10-12: qty 6
  Filled 2020-10-12: qty 3

## 2020-10-12 MED ORDER — LEVOTHYROXINE SODIUM 25 MCG PO TABS
125.0000 ug | ORAL_TABLET | Freq: Every day | ORAL | Status: DC
  Administered 2020-10-13 – 2020-10-30 (×17): 125 ug
  Filled 2020-10-12 (×18): qty 1

## 2020-10-12 MED ORDER — SODIUM BICARBONATE 650 MG PO TABS: 650.0000 mg | ORAL_TABLET | Freq: Once | ORAL | Status: DC

## 2020-10-12 MED ORDER — CHLORHEXIDINE GLUCONATE 0.12 % MT SOLN
15.0000 mL | Freq: Two times a day (BID) | OROMUCOSAL | Status: DC
  Administered 2020-10-12 – 2020-10-30 (×37): 15 mL via OROMUCOSAL
  Filled 2020-10-12 (×36): qty 15

## 2020-10-12 MED ORDER — DIPHENHYDRAMINE HCL 12.5 MG/5ML PO ELIX: 12.5000 mg | ORAL_SOLUTION | Freq: Four times a day (QID) | ORAL | Status: DC | PRN

## 2020-10-12 MED ORDER — PROCHLORPERAZINE 25 MG RE SUPP: 12.5000 mg | Freq: Four times a day (QID) | RECTAL | Status: DC | PRN

## 2020-10-12 MED ORDER — PROCHLORPERAZINE EDISYLATE 10 MG/2ML IJ SOLN: 5.0000 mg | Freq: Four times a day (QID) | INTRAMUSCULAR | Status: DC | PRN

## 2020-10-12 MED ORDER — FLORANEX PO PACK
1.0000 g | PACK | Freq: Three times a day (TID) | ORAL | Status: DC
  Filled 2020-10-12 (×4): qty 1

## 2020-10-12 MED ORDER — BUDESONIDE 0.5 MG/2ML IN SUSP
0.5000 mg | Freq: Two times a day (BID) | RESPIRATORY_TRACT | Status: DC
  Administered 2020-10-13 – 2020-10-30 (×35): 0.5 mg via RESPIRATORY_TRACT
  Filled 2020-10-12 (×38): qty 2

## 2020-10-12 MED ORDER — TRAZODONE HCL 50 MG PO TABS: 50.0000 mg | ORAL_TABLET | Freq: Every evening | ORAL | Status: DC | PRN

## 2020-10-12 MED ORDER — LOPERAMIDE HCL 1 MG/7.5ML PO SUSP
4.0000 mg | Freq: Four times a day (QID) | ORAL | Status: DC
  Administered 2020-10-12 – 2020-10-21 (×31): 4 mg
  Filled 2020-10-12 (×36): qty 30

## 2020-10-12 MED ORDER — DIPHENOXYLATE-ATROPINE 2.5-0.025 MG/5ML PO LIQD: 5.0000 mL | Freq: Four times a day (QID) | ORAL | Status: DC | PRN

## 2020-10-12 MED ORDER — PROCHLORPERAZINE MALEATE 5 MG PO TABS: 5.0000 mg | ORAL_TABLET | Freq: Four times a day (QID) | ORAL | Status: DC | PRN

## 2020-10-12 MED ORDER — TRAMADOL HCL 50 MG PO TABS
25.0000 mg | ORAL_TABLET | Freq: Four times a day (QID) | ORAL | Status: DC | PRN
  Administered 2020-10-13 – 2020-10-30 (×5): 25 mg
  Filled 2020-10-12 (×5): qty 1

## 2020-10-12 MED ORDER — ALPRAZOLAM 0.5 MG PO TABS
0.5000 mg | ORAL_TABLET | Freq: Two times a day (BID) | ORAL | Status: DC | PRN
  Administered 2020-10-12: 0.5 mg
  Filled 2020-10-12: qty 1

## 2020-10-12 MED ORDER — TAMSULOSIN HCL 0.4 MG PO CAPS
0.4000 mg | ORAL_CAPSULE | Freq: Every day | ORAL | Status: AC
Start: 2020-10-12 — End: ?

## 2020-10-12 NOTE — H&P (Incomplete)
Physical Medicine and Rehabilitation Admission H&P    CC: Debility due to T11 chance fracture/VDRF/medical issues.    HPI:  Devin [name] is a 80 year old male with history of HTN, T2DM,  Crohn's disease, A fib, Lung cancer s/p LUL lobectomy, restrictive lung disease, ankylosing spondylosis who sustained a fall on 09/07/2020 with progressive pain.  He was admitted on 09/10/2020 with acute on chronic hypercarbic respiratory failure, T11 Chance fracture and agitation.  He underwent posterior thoracic fusion T11-T12 on 04/08 by Dr. Ronnald Ramp and had difficulty tolerating extubation.  He ultimately required tracheostomy on 04/12 and continued to have issues with copious secretion via trach, developed Kleb pneumonia and was treated with course of ceftriaxone.  He also had issues with significant diarrhea due to inability to take Pentasa through his core track and n.p.o. status.  Dr. Hilarie Fredrickson  was consulted for input and recommended managing diarrhea with rectal tube as well as Lomotil with probiotics until able to tolerate p.o.'s.  He was extubated to ATC and was requiring 8 to 10 L via ATC as well as frequent suctioning due to copious secretions.  He had issues with hyperkalemia as well as AKI and nephrology recommended changing of tube feeds to nephro and he was treated with Lokelma, Kayexalate, IV lasix, insulin as well as sodium bicarb with improvement. He underwent biopsy of supraglottic mass (seen on MBS) by Dr. Lestine Mount negative for malignancy. He was admitted to CIR on 04/29 for comprehensive inpatient rehab and was found to have urinary retention requiring in and out catheterization.  On a.m. of 04/30, patient had pulled out his feeding tube and NG tube was extremely difficult to place and tube feeds were held with subsequent hypoglycemic episodes.  Chest x-ray done showing concerns of aspiration pneumonia with bilateral pleural effusions and he was started on broad-spectrum antibiotic.   He did develop encephalopathy with worsening of hypoxia, concerns of sepsis and was transferred to acute floor for management.  Coretak  was replaced with resumption of tube feeds and water flushes increased to help manage AKI.   PCCM consulted on 05/03 due to difficulty to inability to pass suction via trach sue to obstruction. He underwent bronchoscopy with placement of XLT shiley by Marni Griffon NP. CT neck done revealing improvement in retropharyngeal fullness on right, negative for subglottic stenosis and moderately large right pleural effusion. PCCM felt that no intervention needed for effusion as patient without symptoms and to keep XLT in place for 2 weeks to allow for irritation to resolve and have ENT re-evaluate for input and to decannulate in OR for inspection of entire airway. As procalcitonin trending down with resolution of encephalopathy, antibiotics narrowed to Zosyn.  Tube feeds changed back to Glucerna by dietitian due to concerns of severe malnutrition. Therapy resumed and patient continues to be limited by generalized weakness, BLE weakness with back pain, decreased activity tolerance and balance deficits. CIR recommended due to functional decline.    Review of Systems  Constitutional: Negative for chills and fever.  HENT: Negative for hearing loss and tinnitus.   Eyes: Negative for blurred vision and double vision.  Respiratory: Positive for cough and shortness of breath.   Cardiovascular: Positive for palpitations and leg swelling. Negative for chest pain.  Gastrointestinal: Positive for diarrhea. Negative for heartburn and nausea.       Had started having issues with swallowing/? GERD prior to fall.   Genitourinary: Negative for dysuria and urgency.  Skin: Negative for rash.  Neurological:  Positive for weakness. Negative for dizziness and headaches.     Past Medical History:  Diagnosis Date  . A-fib ([place])   . 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  Ankylosing spondylitis ([place])    Diagnosed during lumbar fracture summer of 2016    . Crohn's disease ([place])   . Gout   . HIstory of basal cell cancer of face    THYROID CA HX  . History of kidney stones   . Hypertension   . Hypothyroidism   . Impotence   . 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 Obesity (BMI 30-39.9)   . Osteoporosis    Pt completed 5 years of fosamax in 2013     . Prostate cancer with recurrence    Treated with prostatectomy with recurrence 2012 with Lupron treatment now him   . Psoriasis   . Thyroid cancer ([place]) 1999   Treated with RAI and total thyroidectomy   . Type II diabetes mellitus ([place])     Past Surgical History:  Procedure Laterality Date  . ABDOMINAL EXPLORATION SURGERY     for small bowel obstruction  . APPENDECTOMY  03/2007  . BACK SURGERY    . BASAL CELL CARCINOMA EXCISION  "several"   "head"  . CARDIAC CATHETERIZATION  03/17/2003  . CHOLECYSTECTOMY N/A 05/07/2016   Procedure: LAPAROSCOPIC CHOLECYSTECTOMY;  Surgeon: Fanny Skates, MD;  Location: [facility];  Service: General;  Laterality: N/A;  . COLON SURGERY  03/2007   Resection of cecum, appendix, terminal ileum (approximately/notes 10/10/2010  . CYSTOSCOPY/URETEROSCOPY/HOLMIUM LASER/STENT PLACEMENT Right 07/03/2020   Procedure: CYSTOSCOPY/RETROGRADE/URETEROSCOPY REMOVAL OF BLADDER STONE;  Surgeon: Raynelle Bring, MD;  Location: [facility];  Service: Urology;  Laterality: Right;  . DIRECT LARYNGOSCOPY N/A 10/04/2020   Procedure: DIRECT LARYNGOSCOPY, PHARYNGOSCOPY WITH BIOPSY;  Surgeon: [name], DO;  Location: [facility];  Service: ENT;  Laterality: N/A;  . ESOPHAGOSCOPY N/A 10/04/2020   Procedure: ESOPHAGOSCOPY WITH BIOPSY;  Surgeon: [name], DO;  Location: [facility];  Service: ENT;  Laterality: N/A;  . HERNIA REPAIR    . LAMINECTOMY WITH POSTERIOR LATERAL ARTHRODESIS LEVEL 1 N/A 09/15/2020   Procedure: REVISION OF THORACOLUMBAR FUSION, ADDITION OF CROSS-CONNECTORS;   Surgeon: Eustace Moore, MD;  Location: [facility];  Service: Neurosurgery;  Laterality: N/A;  . LAPAROSCOPIC CHOLECYSTECTOMY  05/07/2016  . LAPAROSCOPIC LYSIS OF ADHESIONS  05/07/2016  . LAPAROSCOPIC LYSIS OF ADHESIONS N/A 05/07/2016   Procedure: LAPAROSCOPIC LYSIS OF ADHESIONS TIMES ONE HOUR;  Surgeon: Fanny Skates, MD;  Location: [facility];  Service: General;  Laterality: N/A;  . POSTERIOR FUSION THORACIC SPINE  02/08/2016   1. Posterior thoracic arthrodesis T7-T11 utilizing morcellized allograft, 2. Posterior thoracic segmental fixation T7-T11 utilizing nuvasive pedicle screws  . PROSTATECTOMY  06/2001   w/bilateral pelvic lymph nose dissection/notes 10/24/2010  . SPINAL FUSION  12/2014   Open reduction internal fixation of L1 Chance fracture with posterior fusion T10-L4 utilizing morcellized allograft and some local autograft, segmental instrumentation T10-L4 inclusive utilizing nuvasive pedicle screws/notes 12/16/2014  . Stress Cardiolite  02/17/2003  . THOROCOTOMY WITH LOBECTOMY  03/16/2015   Procedure: THOROCOTOMY WITH LOBECTOMY;  Surgeon: Ivin Poot, MD;  Location: [facility];  Service: Thoracic;;  . TONSILLECTOMY    . TOTAL THYROIDECTOMY  1997  . Venous Doppler  05/30/2004  . VENTRAL HERNIA REPAIR  04/14/2008  . VIDEO ASSISTED THORACOSCOPY Left 03/16/2015   Procedure: VIDEO ASSISTED THORACOSCOPY;  Surgeon: Ivin Poot, MD;  [facility];  Service: Thoracic;  Laterality: Left;    Family History  Problem Relation Age of Onset  . CAD Mother   . Cancer Neg Hx        No cancer in the patient's immediate family, except of course for the patient himself, as noted    Social History:  reports that he quit smoking about 30 years ago. His smoking use included cigarettes. He has a 60.00 pack-year smoking history. He has never used smokeless tobacco. He reports previous alcohol use. He reports that he does not use drugs.    Allergies: No Known Allergies    Medications Prior to Admission   Medication Sig Dispense Refill  . acetaminophen (TYLENOL) 160 MG/5ML solution Place 15.6 mLs (500 mg total) into feeding tube every 6 (six) hours. 120 mL 0  . acetylcysteine (MUCOMYST) 20 % nebulizer solution Take 3 mLs (600 mg total) by nebulization 2 (two) times daily. 30 mL 12  . albuterol (VENTOLIN HFA) 108 (90 Base) MCG/ACT inhaler Inhale 2 puffs into the lungs every 6 (six) hours as needed for wheezing or shortness of breath. 8 g 6  . ALPRAZolam (XANAX) 0.5 MG tablet Take 0.5 mg by mouth 2 (two) times daily as needed for anxiety.    Marland Kitchen apixaban (ELIQUIS) 5 MG TABS tablet Take 5 mg by mouth 2 (two) times daily.    Marland Kitchen atenolol (TENORMIN) 25 MG tablet Take 1 tablet (25 mg total) by mouth 2 (two) times daily.    . B-D ULTRAFINE III SHORT PEN 31G X 8 MM MISC USE 1 PEN NEEDLE FOUR TIMES DAILY. 100 each 0  . bethanechol (URECHOLINE) 5 MG tablet 1 tablet (5 mg total) by Per NG tube route 4 (four) times daily.    . budesonide (PULMICORT) 0.5 MG/2ML nebulizer solution Take 2 mLs (0.5 mg total) by nebulization 2 (two) times daily.  12  . busPIRone (BUSPAR) 15 MG tablet Take 15 mg by mouth 2 (two) times daily.     . calcium-vitamin D (OSCAL WITH D) 500-200 MG-UNIT tablet Take 2 tablets by mouth daily.     . cholestyramine (QUESTRAN) 4 g packet Place 1 packet (4 g total) into feeding tube 2 (two) times daily before lunch and supper. 60 each 12  . diltiazem (CARDIZEM) 10 mg/ml oral suspension Place 3 mLs (30 mg total) into feeding tube every 6 (six) hours.    . diphenhydrAMINE (BENADRYL) 25 mg capsule 1 capsule (25 mg total) by Per NG tube route at bedtime as needed for sleep. 30 capsule 0  . ferric gluconate 250 mg in sodium chloride 0.9 % 100 mL Inject 250 mg into the vein daily.    . fiber (NUTRISOURCE FIBER) PACK packet Place 1 packet into feeding tube 2 (two) times daily.    . Glucose Blood (ASCENSIA CONTOUR TEST VI) Use as directed to test two times a day    . glucose blood (BAYER CONTOUR TEST) test  strip TEST BLOOD SUGAR TWICE DAILY AS DIRECTED. 200 each 2  . glucose blood test strip 1 each by Other route 2 times daily.    Marland Kitchen guaiFENesin-dextromethorphan (ROBITUSSIN DM) 100-10 MG/5ML syrup Place 5 mLs into feeding tube every 8 (eight) hours. 118 mL 0  . insulin aspart (NOVOLOG) 100 UNIT/ML injection Inject 0-20 Units into the skin every 4 (four) hours. 10 mL 11  . insulin glargine (LANTUS) 100 UNIT/ML injection Inject 0.12 mLs (12 Units total) into the skin 2 (two) times daily. 10 mL 11  .  leuprolide (LUPRON) 11.25 MG injection Inject 11.25 mg into the muscle every 6 (six) months.     . levothyroxine (SYNTHROID) 125 MCG tablet Take 1 tablet (125 mcg total) by mouth daily before breakfast. 30 tablet 0  . loperamide HCl (IMODIUM) 1 MG/7.5ML suspension Place 15 mLs (2 mg total) into feeding tube 2 (two) times daily as needed for diarrhea or loose stools. 120 mL 0  . loperamide HCl (IMODIUM) 1 MG/7.5ML suspension Place 30 mLs (4 mg total) into feeding tube 4 (four) times daily. 120 mL 0  . Mesalamine 800 MG TBEC Take 1,600 mg by mouth 2 (two) times daily.    . methocarbamol (ROBAXIN) 500 MG tablet Take 500 mg by mouth every 8 (eight) hours.    . Multiple Vitamins-Iron (DAILY-VITE/IRON/BETA-CAROTENE) TABS Take 1 tablet by mouth daily.     . nutrition supplement, JUVEN, (JUVEN) PACK Place 1 packet into feeding tube 2 (two) times daily between meals.  0  . Nutritional Supplements (FEEDING SUPPLEMENT, NEPRO CARB STEADY,) LIQD Place 1,000 mLs into feeding tube continuous.  0  . Nutritional Supplements (FEEDING SUPPLEMENT, PROSOURCE TF,) liquid Place 45 mLs into feeding tube daily.    Marland Kitchen Nystatin (GERHARDT'S BUTT CREAM) CREA Apply 1 application topically 2 (two) times daily.    . ondansetron (ZOFRAN) 4 MG/2ML SOLN injection Inject 2 mLs (4 mg total) into the vein every 6 (six) hours as needed for nausea or vomiting. 2 mL 0  . oxyCODONE (ROXICODONE) 5 MG/5ML solution Place 5 mLs (5 mg total) into feeding  tube every 4 (four) hours as needed for moderate pain.  0  . phenol (CHLORASEPTIC) 1.4 % LIQD Use as directed 1 spray in the mouth or throat as needed for throat irritation / pain.  0  . revefenacin (YUPELRI) 175 MCG/3ML nebulizer solution Take 3 mLs (175 mcg total) by nebulization daily. 90 mL   . Tiotropium Bromide Monohydrate (SPIRIVA RESPIMAT) 2.5 MCG/ACT AERS Inhale 2 puffs into the lungs daily. 4 g 0  . Water For Irrigation, Sterile (FREE WATER) SOLN Place 100 mLs into feeding tube every 8 (eight) hours.    Marland Kitchen zinc gluconate 50 MG tablet Take 50 mg by mouth daily.      Drug Regimen Review { DRUG REGIMEN [id number]}  Home: Home Living Family/patient expects to be discharged to:: Other (Comment) (CIR) Living Arrangements: Spouse/significant other Available Help at Discharge: Family,Available 24 hours/day Type of Home: House Home Access: Stairs to enter Home Layout: One level Bathroom Shower/Tub: Multimedia programmer: Handicapped height Bathroom Accessibility: Yes Home Equipment: Environmental consultant - 2 wheels,Bedside commode Additional Comments: daughter present at time of this eval.  Lives With: Spouse   Functional History: Prior Function Level of Independence: Independent  Functional Status:  Mobility: Bed Mobility Overal bed mobility: Needs Assistance Bed Mobility: Rolling,Sidelying to Sit Rolling: Supervision Sidelying to sit: HOB elevated,Min guard General bed mobility comments: Received sitting EOB with RN/NT Transfers Overall transfer level: Needs assistance Equipment used: Rolling walker (2 wheeled) Transfers: Sit to/from Stand Sit to Stand: Min assist,Mod assist Stand pivot transfers: Min guard General transfer comment: Pt reliant on minA to power into standing from elevated bed height; modA for trunk elevation and eccentric control with sit<>stand from recliner, heavy reliance on UE support Ambulation/Gait Ambulation/Gait assistance: Min assist,+2  safety/equipment,Mod assist Gait Distance (Feet): 4 Feet Assistive device: Rolling walker (2 wheeled) Gait Pattern/deviations: Step-to pattern,Trunk flexed General Gait Details: Short ambulation distance from bed to recliner with RW, min-modA for stability; noted bilateral  knee instabiltiy and pt c/o back pain; pt declined further distance secondary to fatigue, but agreeable to additional standing trials after prolonged seated rest Gait velocity: Decreased Gait velocity interpretation: <1.31 ft/sec, indicative of household ambulator    ADL: ADL Overall ADL's : Needs assistance/impaired Eating/Feeding: NPO Grooming: Minimal assistance,Sitting Upper Body Bathing: Moderate assistance,Sitting Lower Body Bathing: Maximal assistance,Sitting/lateral leans,Bed level,Cueing for safety Upper Body Dressing : Maximal assistance,Sitting Upper Body Dressing Details (indicate cue type and reason): Pt partially donning brace. Requiring Max A for positioning and stabilizing brace Lower Body Dressing: Maximal assistance,Cueing for safety,Sitting/lateral leans,Sit to/from stand Toilet Transfer: Minimal assistance,+2 for safety/equipment,Ambulation,RW Toilet Transfer Details (indicate cue type and reason): MinA for support and for line management. Toileting- Clothing Manipulation and Hygiene: Total assistance,Sitting/lateral lean,Sit to/from stand Toileting - Clothing Manipulation Details (indicate cue type and reason): has catheter and rectal tube Functional mobility during ADLs: +2 for physical assistance,Minimal assistance,Cueing for safety,Rolling walker General ADL Comments: Pt limited by decreased strength, endurance for transferring to recliner and poor ability to care for self without extensive assist.  Cognition: Cognition Overall Cognitive Status: Difficult to assess Orientation Level: Intubated/Tracheostomy - Unable to assess Cognition Arousal/Alertness: Awake/alert Behavior During Therapy:  Flat affect Overall Cognitive Status: Difficult to assess Area of Impairment: Following commands Memory: Decreased recall of precautions Following Commands: Follows one step commands with increased time General Comments: Pt with flat affect, seems related to fatigue; endorses frustration related to hospital admission. Family present and reports "he looks really good today" (more alert and interactive) Difficult to assess due to: Tracheostomy   Blood pressure (!) 125/49, pulse (!) 56, temperature 98.1 F (36.7 C), temperature source Oral, resp. rate 20, weight 91.3 kg, SpO2 99 %. Physical Exam Pulmonary:     Comments: Rales on the right and LLL.  Skin:    Comments: Multiple ecchymosis bilateral forearms.   Neurological:     Comments: Fatigued appearing and had difficulty staying awake. Dysphonia with minimal phonation with PMSV.      Results for orders placed or performed during the hospital encounter of 10/07/20 (from the past 48 hour(s))  Glucose, capillary     Status: Abnormal   Collection Time: 10/10/20 12:22 PM  Result Value Ref Range   Glucose-Capillary 126 (H) 70 - 99 mg/dL    Comment: Glucose reference range applies only to samples taken after fasting for at least 8 hours.  Glucose, capillary     Status: Abnormal   Collection Time: 10/10/20  4:20 PM  Result Value Ref Range   Glucose-Capillary 126 (H) 70 - 99 mg/dL    Comment: Glucose reference range applies only to samples taken after fasting for at least 8 hours.  Glucose, capillary     Status: Abnormal   Collection Time: 10/10/20  7:42 PM  Result Value Ref Range   Glucose-Capillary 147 (H) 70 - 99 mg/dL    Comment: Glucose reference range applies only to samples taken after fasting for at least 8 hours.  Glucose, capillary     Status: Abnormal   Collection Time: 10/10/20 11:49 PM  Result Value Ref Range   Glucose-Capillary 115 (H) 70 - 99 mg/dL    Comment: Glucose reference range applies only to samples taken after  fasting for at least 8 hours.  Procalcitonin     Status: None   Collection Time: 10/11/20  3:38 AM  Result Value Ref Range   Procalcitonin 1.90 ng/mL    Comment:        Interpretation: PCT >  0.5 ng/mL and <= 2 ng/mL: Systemic infection (sepsis) is possible, but other conditions are known to elevate PCT as well. (NOTE)       Sepsis PCT Algorithm           Lower Respiratory Tract                                      Infection PCT Algorithm    ----------------------------     ----------------------------         PCT < 0.25 ng/mL                PCT < 0.10 ng/mL          Strongly encourage             Strongly discourage   discontinuation of antibiotics    initiation of antibiotics    ----------------------------     -----------------------------       PCT 0.25 - 0.50 ng/mL            PCT 0.10 - 0.25 ng/mL               OR       >80% decrease in PCT            Discourage initiation of                                            antibiotics      Encourage discontinuation           of antibiotics    ----------------------------     -----------------------------         PCT >= 0.50 ng/mL              PCT 0.26 - 0.50 ng/mL                AND       <80% decrease in PCT             Encourage initiation of                                             antibiotics       Encourage continuation           of antibiotics    ----------------------------     -----------------------------        PCT >= 0.50 ng/mL                  PCT > 0.50 ng/mL               AND         increase in PCT                  Strongly encourage                                      initiation of antibiotics    Strongly encourage escalation           of antibiotics                                     -----------------------------  PCT <= 0.25 ng/mL                                                 OR                                        > 80% decrease in PCT                                       Discontinue / Do not initiate                                             antibiotics  Performed at [facility], [place] [address], [place], [name] [address]   CBC with Differential/Platelet     Status: Abnormal   Collection Time: 10/11/20  3:38 AM  Result Value Ref Range   WBC 11.8 (H) 4.0 - 10.5 K/uL   RBC 2.82 (L) 4.22 - 5.81 MIL/uL   Hemoglobin 8.3 (L) 13.0 - 17.0 g/dL   HCT 27.7 (L) 39.0 - 52.0 %   MCV 98.2 80.0 - 100.0 fL   MCH 29.4 26.0 - 34.0 pg   MCHC 30.0 30.0 - 36.0 g/dL   RDW 14.8 11.5 - 15.5 %   Platelets 365 150 - 400 K/uL   nRBC 0.0 0.0 - 0.2 %   Neutrophils Relative % 77 %   Neutro Abs 9.1 (H) 1.7 - 7.7 K/uL   Lymphocytes Relative 9 %   Lymphs Abs 1.0 0.7 - 4.0 K/uL   Monocytes Relative 8 %   Monocytes Absolute 0.9 0.1 - 1.0 K/uL   Eosinophils Relative 3 %   Eosinophils Absolute 0.4 0.0 - 0.5 K/uL   Basophils Relative 0 %   Basophils Absolute 0.0 0.0 - 0.1 K/uL   Immature Granulocytes 3 %   Abs Immature Granulocytes 0.40 (H) 0.00 - 0.07 K/uL    Comment: Performed at [facility], [place] [address] [place], [place] [address]  Comprehensive metabolic panel     Status: Abnormal   Collection Time: 10/11/20  3:38 AM  Result Value Ref Range   Sodium 133 (L) 135 - 145 mmol/L   Potassium 4.3 3.5 - 5.1 mmol/L   Chloride 94 (L) 98 - 111 mmol/L   CO2 33 (H) 22 - 32 mmol/L   Glucose, Bld 127 (H) 70 - 99 mg/dL    Comment: Glucose reference range applies only to samples taken after fasting for at least 8 hours.   BUN 26 (H) 8 - 23 mg/dL   Creatinine, Ser 1.06 0.61 - 1.24 mg/dL   Calcium 8.4 (L) 8.9 - 10.3 mg/dL   Total Protein 5.2 (L) 6.5 - 8.1 g/dL   Albumin 2.0 (L) 3.5 - 5.0 g/dL   AST 15 15 - 41 U/L   ALT 10 0 - 44 U/L   Alkaline Phosphatase 103 38 - 126 U/L   Total Bilirubin 0.4 0.3 - 1.2 mg/dL   GFR, Estimated >60 >60 mL/min    Comment: (NOTE) Calculated using the  CKD-EPI Creatinine Equation (2021)    Anion gap 6 5 - 15     Comment: Performed at [facility], [place] [address] [place], [place] [address]  Glucose, capillary     Status: Abnormal   Collection Time: 10/11/20  4:42 AM  Result Value Ref Range   Glucose-Capillary 122 (H) 70 - 99 mg/dL    Comment: Glucose reference range applies only to samples taken after fasting for at least 8 hours.  Glucose, capillary     Status: Abnormal   Collection Time: 10/11/20  7:33 AM  Result Value Ref Range   Glucose-Capillary 125 (H) 70 - 99 mg/dL    Comment: Glucose reference range applies only to samples taken after fasting for at least 8 hours.  Glucose, capillary     Status: Abnormal   Collection Time: 10/11/20 12:22 PM  Result Value Ref Range   Glucose-Capillary 149 (H) 70 - 99 mg/dL    Comment: Glucose reference range applies only to samples taken after fasting for at least 8 hours.  Glucose, capillary     Status: Abnormal   Collection Time: 10/11/20  4:17 PM  Result Value Ref Range   Glucose-Capillary 148 (H) 70 - 99 mg/dL    Comment: Glucose reference range applies only to samples taken after fasting for at least 8 hours.  Glucose, capillary     Status: Abnormal   Collection Time: 10/11/20  7:27 PM  Result Value Ref Range   Glucose-Capillary 160 (H) 70 - 99 mg/dL    Comment: Glucose reference range applies only to samples taken after fasting for at least 8 hours.  Glucose, capillary     Status: Abnormal   Collection Time: 10/11/20 11:04 PM  Result Value Ref Range   Glucose-Capillary 117 (H) 70 - 99 mg/dL    Comment: Glucose reference range applies only to samples taken after fasting for at least 8 hours.  CBC with Differential/Platelet     Status: Abnormal   Collection Time: 10/12/20  2:49 AM  Result Value Ref Range   WBC 16.4 (H) 4.0 - 10.5 K/uL   RBC 2.94 (L) 4.22 - 5.81 MIL/uL   Hemoglobin 8.8 (L) 13.0 - 17.0 g/dL   HCT 29.3 (L) 39.0 - 52.0 %   MCV 99.7 80.0 - 100.0 fL   MCH 29.9 26.0 - 34.0 pg   MCHC 30.0 30.0 - 36.0 g/dL   RDW 15.2 11.5 -  15.5 %   Platelets 449 (H) 150 - 400 K/uL   nRBC 0.0 0.0 - 0.2 %   Neutrophils Relative % 74 %   Neutro Abs 12.4 (H) 1.7 - 7.7 K/uL   Lymphocytes Relative 10 %   Lymphs Abs 1.6 0.7 - 4.0 K/uL   Monocytes Relative 8 %   Monocytes Absolute 1.2 (H) 0.1 - 1.0 K/uL   Eosinophils Relative 2 %   Eosinophils Absolute 0.4 0.0 - 0.5 K/uL   Basophils Relative 1 %   Basophils Absolute 0.1 0.0 - 0.1 K/uL   Immature Granulocytes 5 %   Abs Immature Granulocytes 0.75 (H) 0.00 - 0.07 K/uL    Comment: Performed at [facility], [address], [place], [place] [address]  Comprehensive metabolic panel     Status: Abnormal   Collection Time: 10/12/20  2:49 AM  Result Value Ref Range   Sodium 135 135 - 145 mmol/L   Potassium 4.4 3.5 - 5.1 mmol/L   Chloride 96 (L) 98 - 111 mmol/L  CO2 33 (H) 22 - 32 mmol/L   Glucose, Bld 121 (H) 70 - 99 mg/dL    Comment: Glucose reference range applies only to samples taken after fasting for at least 8 hours.   BUN 31 (H) 8 - 23 mg/dL   Creatinine, Ser 0.90 0.61 - 1.24 mg/dL   Calcium 8.7 (L) 8.9 - 10.3 mg/dL   Total Protein 5.5 (L) 6.5 - 8.1 g/dL   Albumin 2.3 (L) 3.5 - 5.0 g/dL   AST 16 15 - 41 U/L   ALT 9 0 - 44 U/L   Alkaline Phosphatase 107 38 - 126 U/L   Total Bilirubin 0.4 0.3 - 1.2 mg/dL   GFR, Estimated >60 >60 mL/min    Comment: (NOTE) Calculated using the CKD-EPI Creatinine Equation (2021)    Anion gap 6 5 - 15    Comment: Performed at [facility], [place] [address], [place], [place] [address]  Glucose, capillary     Status: Abnormal   Collection Time: 10/12/20  3:16 AM  Result Value Ref Range   Glucose-Capillary 109 (H) 70 - 99 mg/dL    Comment: Glucose reference range applies only to samples taken after fasting for at least 8 hours.  Glucose, capillary     Status: Abnormal   Collection Time: 10/12/20  7:25 AM  Result Value Ref Range   Glucose-Capillary 144 (H) 70 - 99 mg/dL    Comment: Glucose reference range applies only to samples  taken after fasting for at least 8 hours.  Glucose, capillary     Status: Abnormal   Collection Time: 10/12/20 11:28 AM  Result Value Ref Range   Glucose-Capillary 157 (H) 70 - 99 mg/dL    Comment: Glucose reference range applies only to samples taken after fasting for at least 8 hours.   CT SOFT TISSUE NECK W CONTRAST  Result Date: 10/10/2020 CLINICAL DATA:  Concern for progressive subglottic stenosis and obstruction. Tracheostomy. Recent laryngoscopy demonstrated no discrete mass lesion of the mucosa. There is fullness of the posterior pharyngeal wall on the right which was biopsied. No malignancy detected on pathology. EXAM: CT NECK WITH CONTRAST TECHNIQUE: Multidetector CT imaging of the neck was performed using the standard protocol following the bolus administration of intravenous contrast. CONTRAST:  68mL OMNIPAQUE IOHEXOL 300 MG/ML  SOLN COMPARISON:  CT neck 09/30/2020 FINDINGS: Pharynx and larynx: Tracheostomy again noted in satisfactory position. NG tube also in place in the esophagus. Previously noted fullness in the retropharyngeal soft tissues on the right has significantly improved in the interval. No discrete mass identified. Epiglottis and larynx normal. No airway compromise. No evidence of subglottic stenosis. The tip of the tracheostomy tube is not included on the study. Salivary glands: Negative Thyroid: Postop thyroidectomy for cancer. Stable soft tissue density in the thyroid bed bilaterally without defined mass lesion. This may be due to postoperative scar tissue in the thyroid bed. Lymph nodes: Negative for enlarged lymph nodes in the neck. Vascular: Normal vascular enhancement. Limited intracranial: Negative Visualized orbits: Negative Mastoids and visualized paranasal sinuses: Mild mucosal edema right maxillary sinus. Remaining sinuses clear. Skeleton: No acute skeletal abnormality. Upper chest: Moderately large right pleural effusion. 6 mm spiculated nodule left upper lobe unchanged  from the CT chest 01/04/2020 Other: None IMPRESSION: 1. Negative for subglottic stenosis. Tracheostomy in satisfactory position. The tip of the tracheostomy is not included on the study. 2. Interval improvement in retropharyngeal fullness on the right. 3. Postop thyroidectomy. Stable soft tissue in the thyroid bed  bilaterally, stable from prior study. 4. Moderate large right pleural effusion 5. Stable 6 mm left upper lobe nodule. Electronically Signed   By: Franchot Gallo M.D.   On: 10/10/2020 13:36       Medical Problem List and Plan: 1.  *** secondary to ***  -patient may *** shower  -ELOS/Goals: *** 2.  Antithrombotics: -DVT/anticoagulation:  Pharmaceutical: Other (comment)--Apixaban bid.   -antiplatelet therapy: N/A 3. Pain Management:  Will change Oxycodone to hydrocodone prn to avoid sedative SE. Tramadol prn for moderate pain.   4. Mood: LCSW to follow for evaluation and support.   -antipsychotic agents: N/A 5. Neuropsych: This patient is not fully capable of making decisions on his own behalf. 6. Skin/Wound Care: Pressure relief measures.  7. Fluids/Electrolytes/Nutrition: NPO with tube feeds for nutritional support.  --dietician to assist with input to maintain nutritional status.   --BUN improved from 64->31. Will decrease fluid boluses to every 6 hours to avoid overload. 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   --PCCM to follow for change and input on scope/decannulation in the future.   --suction frequently with chest PT to help mobilize secretions.  9. Acute on chronic respiratory failure: Encourage pulmonary hygiene   --continue Pulmicort nebs bid.  10.  PAF:  Monitor HR tid--continue Atenolol bid with Cardizem tid.  10. Hyponatremia: Has resolved on today's labs. Continue to monitor.  11. Acute blood loss anemia: Monitor with serial checks. Stable.  12. Leucocytosis: WBC continues to fluctuate. Monitor for  signs of infection. 32. Crohn's disease: Continue rectal tube with probiotic and imodium.  14.  Bilateral pleural effusion: Pulmonary hygiene w/ flutter valve.   --continue to monitor for symptoms with increase in activity.   15. Aspiration PNA: On Zosyn--antibiotic day# 6 16. Dysphagia: Continue NPO with Glucerna @ 55 cc/hr  --water flushes 170 cc qid.  64. T2DM: Was on Humalog tid prior to admission.  --will monitor BS every 4 hours as on tube feeds and use SSI prn      ***  Bary Leriche, PA-C 10/12/2020

## 2020-10-12 NOTE — Progress Notes (Signed)
Inpatient Rehabilitation Admissions Coordinator  I have received approval for CIR admit from payor. I have updated acute team, Dr Wyline Copas, and TOC. I met with patient and his wife and daughter, [name], at bedside. I will make the arrangements to admit today.  Danne Baxter, RN, MSN Rehab Admissions Coordinator [phone] 10/12/2020 1:40 PM

## 2020-10-12 NOTE — Discharge Summary (Addendum)
Physician Discharge Summary  Devin [name] YPP:[id number] DOB: 1940/10/31 DOA: 10/07/2020  PCP: Prince Solian, MD  Admit date: 10/07/2020 Discharge date: 10/12/2020  Admitted From: CIR Disposition:  CIR  Recommendations for Outpatient Follow-up:  1. Follow up with PCP in 1-2 weeks 2. Recommend completing total 5 days of zosyn, was started 5/1, would complete through 5/5 3. Continue close follow up with Pulmonary for trach care while in CIR 4. Recommend bladder training while in CIR  Discharge Condition:Stable CODE STATUS:Full Diet recommendation: NPO with tube feeding: Continue TF via NGT: -Transition to Glucerna 1.5 @ 38m/hr (13252md) -4545mrosource TF daily -170m92mee water Q4H  TF provides 2020 kcals, 120g protein, 1002ml53me water (2022ml 102ml free water with flushes)  Continue Juven BID via tube  Brief/Interim Summary: 79 yea40old white male A. 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 ScotniIsaias Cowman Patient subsequently came back to acute care because of dislodged  Core track which was accidental Patient became hypoglycemic and encephalopathic--patient was placed on D5 and has improved His antibiotics were continued given concerns for aspiration and significant secretions  Discharge Diagnoses:  Principal Problem:   Hypoglycemia Active Problems:   Essential hypertension   Crohn's disease (HCC)  [place] (H[place]  [place] (HCC[place]  [place] post thoracic spinal fusion   Status post  [facility]ache[facility]HCC) [facility]  [place] due to tracheostomy (HCC)  [place]struction of upper airway due to foreign body  Emergent trach exchange at bedside 5/3--supra and subglottic stenosis with tracheomalacia Appreciate assistance by PCCM Pt is now s/p emergent trach change 5/3 with findings of increased inflammatory tissue on the post wall of the trachea with recs to keep distal XLT and possible trach change in 2 weeks  Dislodged Coretrack Coretrack was replaced-cont Feeds as tolerated Glucose trends stable, reviewed  Sepsis secondary toKlebsiella pneumonia from prior to admission concern however for superimposed aspiration pneumonia Was initially continued with vancomycin Zosyn--procalcitonin trending down Have narrowed abx coverage to zosyn monotherapy, to complete course through 5/5 for total of 5 days of therapy Recent MRSA nasal swab was neg thus vanc was discontinued  Metabolic encephalopathy Likely secondary to severe hypoglycemia Resolved at this time, per above  Severe hypoglycemia on admission Resumed Nepro Prosource and Juven  AKI resolved-IV fluid discontinued  Status post laking of rod system T7-11 + T11-L4 Recommend outpatient neurosurgery follow-up  Postsurgical hypothyroidism Cont with home meds  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 No malignancy  Paroxysmal A. 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   Bladder outlet obstruction  Noted to consistently retain >300cc urine on bladder  scanning  Have started daily flomax  Have placed indwelling  Foley cath. Recommend bladder training and  Foley wean while in CIR  Discharge Instructions   Allergies as of 10/12/2020   No Known Allergies     Medication List    STOP taking these medications   ASCENSIA CONTOUR TEST VI   B-D ULTRAFINE III SHORT PEN 31G X 8 MM Misc Generic drug: Insulin Pen Needle   bethanechol 5 MG tablet Commonly known as: URECHOLINE   busPIRone 15 MG tablet Commonly known as: BUSPAR   calcium-vitamin D 500-200 MG-UNIT tablet Commonly known as: OSCAL WITH D   Daily-Vite/Iron/Beta-Carotene Tabs   ferric gluconate 250 mg in sodium chloride 0.9 % 100 mL   glucose blood test strip   guaiFENesin-dextromethorphan 100-10 MG/5ML syrup Commonly known as: ROBITUSSIN DM   leuprolide 11.25 MG injection Commonly known as: LUPRON   Mesalamine 800 MG Tbec   methocarbamol 500 MG tablet Commonly known as: ROBAXIN   revefenacin 175 MCG/3ML nebulizer solution Commonly known as: YUPELRI   Spiriva Respimat 2.5 MCG/ACT Aers Generic drug: Tiotropium Bromide Monohydrate   zinc gluconate 50 MG tablet     TAKE these medications   acetaminophen 160 MG/5ML solution Commonly known as: TYLENOL Place 15.6 mLs (500 mg total) into feeding tube every 6 (six) hours.   acetylcysteine 20 % nebulizer solution Commonly known as: MUCOMYST Take 3 mLs (600 mg total) by nebulization 2 (two) times daily.   albuterol 108 (90 Base) MCG/ACT inhaler Commonly known as: VENTOLIN HFA Inhale 2 puffs into the lungs every 6 (six) hours as needed for wheezing or shortness of breath.   ALPRAZolam 0.5 MG tablet Commonly known as: XANAX Take 0.5 mg by mouth 2 (two) times daily as needed for anxiety.   apixaban 5 MG Tabs tablet Commonly known as: ELIQUIS Take 5 mg by mouth 2 (two) times daily.   atenolol 25 MG tablet Commonly known as: TENORMIN Take 1 tablet (25 mg total) by mouth 2 (two) times daily.   budesonide 0.5  MG/2ML nebulizer solution Commonly known as: PULMICORT Take 2 mLs (0.5 mg total) by nebulization 2 (two) times daily.   cholestyramine 4 g packet Commonly known as: QUESTRAN Place 1 packet (4 g total) into feeding tube 2 (two) times daily before lunch and supper.   diltiazem 10 mg/ml  oral suspension Commonly known as: CARDIZEM Place 3 mLs (30 mg total) into feeding tube every 6 (six) hours.   diphenhydrAMINE 25 mg capsule Commonly known as: BENADRYL 1 capsule (25 mg total) by Per NG tube route at bedtime as needed for sleep.   nutrition supplement (JUVEN) Pack Place 1 packet into feeding tube 2 (two) times daily between meals. What changed: Another medication with the same name was removed. Continue taking this medication, and follow the directions you see here.   feeding supplement (PROSource TF) liquid Place 45 mLs into feeding tube daily. What changed: Another medication with the same name was removed. Continue taking this medication, and follow the directions you see here.   fiber Pack packet Place 1 packet into feeding tube 2 (two) times daily.   free water Soln Place 100 mLs into feeding tube every 8 (eight) hours.   Gerhardt's butt cream Crea Apply 1 application topically 2 (two) times daily.   insulin aspart 100 UNIT/ML injection Commonly known as: novoLOG Inject 0-20 Units into the skin every 4 (four) hours.   insulin glargine 100 UNIT/ML injection Commonly known as: LANTUS Inject 0.12 mLs (12 Units total) into the skin 2 (two) times daily.  lactobacillus Pack Place 1 packet (1 g total) into feeding tube 3 (three) times daily with meals.   levothyroxine 125 MCG tablet Commonly known as: SYNTHROID Take 1 tablet (125 mcg total) by mouth daily before breakfast.   loperamide HCl 1 MG/7.5ML suspension Commonly known as: IMODIUM Place 15 mLs (2 mg total) into feeding tube 2 (two) times daily as needed for diarrhea or loose stools.   loperamide HCl 1 MG/7.5ML  suspension Commonly known as: IMODIUM Place 30 mLs (4 mg total) into feeding tube 4 (four) times daily.   ondansetron 4 MG/2ML Soln injection Commonly known as: ZOFRAN Inject 2 mLs (4 mg total) into the vein every 6 (six) hours as needed for nausea or vomiting.   oxyCODONE 5 MG/5ML solution Commonly known as: ROXICODONE Place 5 mLs (5 mg total) into feeding tube every 4 (four) hours as needed for moderate pain.   phenol 1.4 % Liqd Commonly known as: CHLORASEPTIC Use as directed 1 spray in the mouth or throat as needed for throat irritation / pain.   piperacillin-tazobactam 3.375 GM/50ML IVPB Commonly known as: ZOSYN Inject 50 mLs (3.375 g total) into the vein every 8 (eight) hours.       Follow-up Information    Avva, Ravisankar, MD. Schedule an appointment as soon as possible for a visit in 2 week(s).   Specialty: Internal Medicine Contact information: [place] [address] [phone]        Sanda Klein, MD .   Specialty: Cardiology Contact information: [address] [place] [address] [phone]              No Known Allergies  Consultations:  PCCM  CIR  Procedures/Studies: DG Thoracolumabar Spine  Result Date: 09/15/2020 CLINICAL DATA:  Elective surgery. T9 through L1 posterior instrumentation. EXAM: THORACOLUMBAR SPINE 1V COMPARISON:  Recent chest radiograph. FINDINGS: Single frontal fluoroscopic spot view of the lower thoracic and upper lumbar spine demonstrates fusion hardware, not entirely included in the field of view. Levels are difficult to delineate on this single view. IMPRESSION: Single frontal fluoroscopic spot view of the lower thoracic and upper lumbar spine demonstrates spinal fusion hardware. Please reference operative report for details. Electronically Signed   By: Keith Rake M.D.   On: 09/15/2020 17:25   DG Abd 1 View  Result Date: 10/07/2020 CLINICAL DATA:  NG tube placement EXAM: ABDOMEN  - 1 VIEW COMPARISON:  None. FINDINGS: Enteric tube overlies the right lung base and is likely within and airway. Unremarkable bowel gas pattern. Extensive thoracolumbar fusion construct. IMPRESSION: Enteric tube is likely malposition within right lower lobe airway. These results will be called to the ordering clinician or representative by the Radiologist Assistant, and communication documented in the PACS or [employer]. Electronically Signed   By: Macy Mis M.D.   On: 10/07/2020 14:43   DG Abd 1 View  Result Date: 10/04/2020 CLINICAL DATA:  Feeding tube placement. EXAM: ABDOMEN - 1 VIEW COMPARISON:  09/16/2020. FINDINGS: Interim removal of NG tube and placement of feeding tube. Feeding tube tip noted over the stomach. Basilar atelectasis. Left pleural effusion cannot be excluded. Postsurgical changes thoracolumbar spine. Surgical clips noted over the abdomen. IMPRESSION: Interim removal of NG tube and placement of feeding tube. Feeding tube tip noted over the stomach. Electronically Signed   By: Marcello Moores  Register   On: 10/04/2020 12:23   DG Abd 1 View  Result Date: 09/16/2020 CLINICAL DATA:  OG tube placement EXAM: ABDOMEN - 1 VIEW COMPARISON:  09/10/2020 FINDINGS: 1233 hours. OG tube tip is in the distal stomach near the pylorus. Extensive thoracolumbar fusion hardware evident. Visualized abdomen demonstrates nonspecific bowel gas pattern. IMPRESSION: OG tube tip is in the distal stomach. Electronically Signed   By: Misty Stanley M.D.   On: 09/16/2020 13:10   CT SOFT TISSUE NECK W CONTRAST  Result Date: 10/10/2020 CLINICAL DATA:  Concern for progressive subglottic stenosis and obstruction. Tracheostomy. Recent laryngoscopy demonstrated no discrete mass lesion of the mucosa. There is fullness of the posterior pharyngeal wall on the right which was biopsied. No malignancy detected on pathology. EXAM: CT NECK WITH CONTRAST TECHNIQUE: Multidetector CT imaging of the neck was performed using the  standard protocol following the bolus administration of intravenous contrast. CONTRAST:  54m OMNIPAQUE IOHEXOL 300 MG/ML  SOLN COMPARISON:  CT neck 09/30/2020 FINDINGS: Pharynx and larynx: Tracheostomy again noted in satisfactory position. NG tube also in place in the esophagus. Previously noted fullness in the retropharyngeal soft tissues on the right has significantly improved in the interval. No discrete mass identified. Epiglottis and larynx normal. No airway compromise. No evidence of subglottic stenosis. The tip of the tracheostomy tube is not included on the study. Salivary glands: Negative Thyroid: Postop thyroidectomy for cancer. Stable soft tissue density in the thyroid bed bilaterally without defined mass lesion. This may be due to postoperative scar tissue in the thyroid bed. Lymph nodes: Negative for enlarged lymph nodes in the neck. Vascular: Normal vascular enhancement. Limited intracranial: Negative Visualized orbits: Negative Mastoids and visualized paranasal sinuses: Mild mucosal edema right maxillary sinus. Remaining sinuses clear. Skeleton: No acute skeletal abnormality. Upper chest: Moderately large right pleural effusion. 6 mm spiculated nodule left upper lobe unchanged from the CT chest 01/04/2020 Other: None IMPRESSION: 1. Negative for subglottic stenosis. Tracheostomy in satisfactory position. The tip of the tracheostomy is not included on the study. 2. Interval improvement in retropharyngeal fullness on the right. 3. Postop thyroidectomy. Stable soft tissue in the thyroid bed bilaterally, stable from prior study. 4. Moderate large right pleural effusion 5. Stable 6 mm left upper lobe nodule. Electronically Signed   By: CFranchot GalloM.D.   On: 10/10/2020 13:36   CT SOFT TISSUE NECK W CONTRAST  Result Date: 09/30/2020 CLINICAL DATA:  Abnormal barium swallow EXAM: CT NECK WITH CONTRAST TECHNIQUE: Multidetector CT imaging of the neck was performed using the standard protocol following  the bolus administration of intravenous contrast. CONTRAST:  1065mOMNIPAQUE IOHEXOL 300 MG/ML  SOLN COMPARISON:  None. FINDINGS: Pharynx and larynx: There is a prevertebral soft tissue mass at the C4-5 level, which is the level of the supraglottic larynx. This is to the right of the esophageal catheter. The abnormality measures approximately 2.6 x 1.0 x 2.8 cm. The supraglottic airway is narrowed. There is nodularity of the vocal folds. Salivary glands: No inflammation, mass, or stone. Thyroid: Surgically absent Lymph nodes: None enlarged or abnormal density. Vascular: Negative. Limited intracranial: Negative. Visualized orbits: Negative. Mastoids and visualized paranasal sinuses: Clear. Skeleton: No acute or aggressive process. Upper chest: Multifocal opacity in the right middle lobe, incompletely visualized. Tracheostomy tube in expected position. Other: None. IMPRESSION: 1. Prevertebral soft tissue mass at the C4-5 level, the level of the supraglottic larynx, measuring approximately 2.6 x 1.0 x 2.8 cm. Additionally, there is nodularity of the vocal folds. 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 Endoscopic visualization recommended. 2. Multifocal opacity in the right middle lobe, incompletely visualized. This may indicate aspiration or pneumonia. Electronically Signed  By: Ulyses Jarred M.D.   On: 09/30/2020 21:15   DG CHEST PORT 1 VIEW  Result Date: 10/10/2020 CLINICAL DATA:  Encounter for tracheostomy tube EXAM: PORTABLE CHEST 1 VIEW COMPARISON:  10/07/2020 FINDINGS: Tracheostomy tube in place. The feeding tube at least reaches the stomach. Hazy bilateral lower chest attributed to layering pleural effusions, potentially large on the right where there has been progression. No visible pneumothorax. Stable partially obscured heart size. Obscured right more than left lower lungs. IMPRESSION: Bilateral pleural effusion with increase on the right. The lower lungs are largely obscured.  Electronically Signed   By: Monte Fantasia M.D.   On: 10/10/2020 10:51   DG Chest Port 1 View  Result Date: 10/07/2020 CLINICAL DATA:  Aspiration pneumonia EXAM: PORTABLE CHEST 1 VIEW COMPARISON:  09/21/2020 FINDINGS: Single frontal view of the chest demonstrates stable tracheostomy tube. The enteric catheter seen previously has been removed. Cardiac silhouette is stable. Persistent bibasilar consolidation and effusions, right greater than left. No evidence of pneumothorax. No acute bony abnormalities. Stable postsurgical changes of the thoracic spine. IMPRESSION: 1. Stable bibasilar consolidation and effusions, right greater than left. Findings could reflect airspace disease, aspiration, or atelectasis. Electronically Signed   By: Randa Ngo M.D.   On: 10/07/2020 15:35   DG CHEST PORT 1 VIEW  Result Date: 09/21/2020 CLINICAL DATA:  Pneumonia EXAM: PORTABLE CHEST 1 VIEW COMPARISON:  09/19/2020 FINDINGS: Nasoenteric feeding tube tip is seen in the expected distal body of the stomach. Tracheostomy unchanged. Mild elevation of the right hemidiaphragm again noted with associated right basilar atelectasis. Small partially loculated left pleural effusion again noted, unchanged. Lungs are otherwise clear. No pneumothorax. Cardiac size within normal limits. IMPRESSION: Nasoenteric feeding tube tip within the distal body of the stomach. Otherwise stable examination with bibasilar atelectasis and small partially loculated left pleural effusion. Electronically Signed   By: Fidela Salisbury MD   On: 09/21/2020 04:52   DG Chest Port 1 View  Result Date: 09/19/2020 CLINICAL DATA:  Post tracheostomy EXAM: PORTABLE CHEST 1 VIEW COMPARISON:  Film from earlier in the same day FINDINGS: Feeding catheter is again identified extending into the stomach. Endotracheal tube is been removed and new tracheostomy tube is noted in satisfactory position. Bibasilar atelectatic changes are again seen and stable. No pneumothorax is  noted. Postsurgical changes the thoracolumbar spine are noted. IMPRESSION: Status post tracheostomy placement. The remainder of the exam is stable from the prior study. Electronically Signed   By: Inez Catalina M.D.   On: 09/19/2020 15:21   DG Chest Port 1 View  Result Date: 09/19/2020 CLINICAL DATA:  Acute respiratory failure. EXAM: PORTABLE CHEST 1 VIEW COMPARISON:  09/18/2020 FINDINGS: Endotracheal tube is 3.2 cm above the carina. Feeding tube extends into the abdomen but the tip is beyond the image. Persistent patchy densities in the lower lungs. Heart size is grossly stable but obscured by the lung densities. Negative for pneumothorax. Scattered surgical clips in the chest. Extensive spinal hardware involving the thoracic and lumbar spine. IMPRESSION: 1. No significant change in the bibasilar lung densities. Findings could represent atelectasis and/or infection with pleural effusions. 2. Endotracheal tube is appropriately positioned. Electronically Signed   By: Markus Daft M.D.   On: 09/19/2020 08:08   Portable Chest x-ray  Result Date: 09/18/2020 CLINICAL DATA:  Hypoxia EXAM: PORTABLE CHEST 1 VIEW COMPARISON:  September 16, 2020 FINDINGS: Endotracheal tube tip is 3.9 cm above the carina. Nasogastric tube tip and side port are below the diaphragm. Right jugular  catheter no longer appreciable. No pneumothorax. There is a stable small left pleural effusion with equivocal small right pleural effusion. There is atelectatic change in the lung bases. There may be mild patchy infiltrate associated with the atelectasis in the right base. Lungs elsewhere clear. Heart size normal. Pulmonary vascularity normal. No adenopathy. Postoperative changes noted in the mid to lower thoracic spine and visualized upper lumbar spine. IMPRESSION: Tube positions as described without pneumothorax. Small pleural effusions with bibasilar atelectasis. Question small focus of pneumonia associated with atelectasis right base. Stable cardiac  silhouette. Electronically Signed   By: Lowella Grip III M.D.   On: 09/18/2020 11:33   DG CHEST PORT 1 VIEW  Result Date: 09/16/2020 CLINICAL DATA:  Intubated.  Thoracic spine surgery yesterday. EXAM: PORTABLE CHEST 1 VIEW COMPARISON:  Chest x-ray from yesterday. FINDINGS: Unchanged endotracheal and enteric tubes. New right internal jugular central venous catheter with tip near the cavoatrial junction. The heart size and mediastinal contours are within normal limits. Improving atelectasis at the lung bases. Unchanged small bilateral pleural effusions. No pneumothorax. Prior thoracolumbar fusion. IMPRESSION: 1. New right internal jugular central venous catheter. No pneumothorax. 2. Improving bibasilar atelectasis. Unchanged small bilateral pleural effusions. Electronically Signed   By: Titus Dubin M.D.   On: 09/16/2020 12:13   DG CHEST PORT 1 VIEW  Result Date: 09/15/2020 CLINICAL DATA:  Endotracheal tube. EXAM: PORTABLE CHEST 1 VIEW COMPARISON:  September 11, 2020. FINDINGS: The heart size and mediastinal contours are within normal limits. No pneumothorax is noted. Endotracheal and nasogastric tubes are in good position. Bibasilar atelectasis is noted with probable small pleural effusions. Status post surgical posterior fusion of lower thoracic spine. IMPRESSION: Endotracheal and nasogastric tubes are in good position. Bibasilar subsegmental atelectasis with probable small pleural effusions. Electronically Signed   By: Marijo Conception M.D.   On: 09/15/2020 08:24   DG Abd Portable 1V  Result Date: 10/09/2020 CLINICAL DATA:  Status post nasogastric catheter placement EXAM: PORTABLE ABDOMEN - 1 VIEW COMPARISON:  None. FINDINGS: Scattered large and small bowel gas is noted. New feeding catheter is noted with the weighted tip in the distal stomach. Extensive hardware is noted overlying the thoracolumbar spine as well as extrinsic to the patient. IMPRESSION: Feeding catheter within the distal stomach.  Electronically Signed   By: Inez Catalina M.D.   On: 10/09/2020 10:32   DG [place] Dys Tube Plc W/Fl W/Rad  Result Date: 10/08/2020 CLINICAL DATA:  Feeding tube needed due to dysphagia and tracheostomy in place. EXAM: NASO G TUBE PLACEMENT WITH FL AND WITH RAD FLUOROSCOPY TIME:  Fluoroscopy Time:  3 minutes 12 seconds 64.4 mGy COMPARISON:  None. FINDINGS: Using fluoroscopic guidance, a weighted tip feeding tube was advanced to the stomach. Stylet was removed. Tubing was taped at the nose. Patient was returned to the floor in stable condition. IMPRESSION: Successful placement of a weighted tip feeding tube into the stomach using fluoroscopic guidance. Electronically Signed   By: Franki Cabot M.D.   On: 10/08/2020 12:51   DG Swallowing Func-Speech Pathology  Result Date: 09/29/2020 Objective Swallowing Evaluation: Type of Study: MBS-Modified Barium Swallow Study  Patient Details Name: Devin [name] MRN: [id number] Date of Birth: [birth year] Today's Date: 09/29/2020 Time: SLP Start Time (ACUTE ONLY): 1305 -SLP Stop Time (ACUTE ONLY): 5993 SLP Time Calculation (min) (ACUTE ONLY): 60 min Past Medical History: Past Medical History: Diagnosis Date . A-fib ([place])  . Adenocarcinoma of left lung, stage 1 ([place]) 03/10/2015 . Ankylosing spondylitis ([place])  Diagnosed during lumbar fracture summer of 2016   . Crohn's disease ([place])  . Gout  . HIstory of basal cell cancer of face   THYROID CA HX . History of kidney stones  . Hypertension  . Hypothyroidism  . Impotence  . 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 Obesity (BMI 30-39.9)  . Osteoporosis   Pt completed 5 years of fosamax in 2013    . Prostate cancer with recurrence   Treated with prostatectomy with recurrence 2012 with Lupron treatment now him  . Psoriasis  . Thyroid cancer ([place]) 1999  Treated with RAI and total thyroidectomy  . Type II diabetes mellitus ([place])  Past Surgical History: Past Surgical History: Procedure Laterality Date . ABDOMINAL  EXPLORATION SURGERY    for small bowel obstruction . APPENDECTOMY  03/2007 . BACK SURGERY   . BASAL CELL CARCINOMA EXCISION  "several"  "head" . CARDIAC CATHETERIZATION  03/17/2003 . CHOLECYSTECTOMY N/A 05/07/2016  Procedure: LAPAROSCOPIC CHOLECYSTECTOMY;  Surgeon: Fanny Skates, MD;  Location: [facility];  Service: General;  Laterality: N/A; . COLON SURGERY  03/2007  Resection of cecum, appendix, terminal ileum (approximately/notes 10/10/2010 . CYSTOSCOPY/URETEROSCOPY/HOLMIUM LASER/STENT PLACEMENT Right 07/03/2020  Procedure: CYSTOSCOPY/RETROGRADE/URETEROSCOPY REMOVAL OF BLADDER STONE;  Surgeon: Raynelle Bring, MD;  Location: [facility];  Service: Urology;  Laterality: Right; . HERNIA REPAIR   . LAMINECTOMY WITH POSTERIOR LATERAL ARTHRODESIS LEVEL 1 N/A 09/15/2020  Procedure: REVISION OF THORACOLUMBAR FUSION, ADDITION OF CROSS-CONNECTORS;  Surgeon: Eustace Moore, MD;  Location: [facility] [place];  Service: Neurosurgery;  Laterality: N/A; . LAPAROSCOPIC CHOLECYSTECTOMY  05/07/2016 . LAPAROSCOPIC LYSIS OF ADHESIONS  05/07/2016 . LAPAROSCOPIC LYSIS OF ADHESIONS N/A 05/07/2016  Procedure: LAPAROSCOPIC LYSIS OF ADHESIONS TIMES ONE HOUR;  Surgeon: Fanny Skates, MD;  Location: [facility];  Service: General;  Laterality: N/A; . POSTERIOR FUSION THORACIC SPINE  02/08/2016  1. Posterior thoracic arthrodesis T7-T11 utilizing morcellized allograft, 2. Posterior thoracic segmental fixation T7-T11 utilizing nuvasive pedicle screws . PROSTATECTOMY  06/2001  w/bilateral pelvic lymph nose dissection/notes 10/24/2010 . SPINAL FUSION  12/2014  Open reduction internal fixation of L1 Chance fracture with posterior fusion T10-L4 utilizing morcellized allograft and some local autograft, segmental instrumentation T10-L4 inclusive utilizing nuvasive pedicle screws/notes 12/16/2014 . Stress Cardiolite  02/17/2003 . THOROCOTOMY WITH LOBECTOMY  03/16/2015  Procedure: THOROCOTOMY WITH LOBECTOMY;  Surgeon: Ivin Poot, MD;  Location: [place] [facility];  Service: Thoracic;; .  TONSILLECTOMY   . TOTAL THYROIDECTOMY  1997 . Venous Doppler  05/30/2004 . VENTRAL HERNIA REPAIR  04/14/2008 . VIDEO ASSISTED THORACOSCOPY Left 03/16/2015  Procedure: VIDEO ASSISTED THORACOSCOPY;  Surgeon: Ivin Poot, MD;  Location: [facility] OR;  Service: Thoracic;  Laterality: Left; HPI: 80 yo male admitted s/p fall 3/31 and obtained T11 chance fracture, stable L1 compression fracture, extensive lumbar spondylosis and postsurgical changes, left 7th rib fx anteriorly, left pleural effusion. Intubated 4/3 and 4/8 s/p rod connection for T11 fx. 4/9 failed attempt extubation. Trach on 4/12 trach placement.  PMH restrictive lung disease, emphysema/COPD adenocarcinoma s/p LUL lobectomy Crohn's, Psoriasis, Afib, nephrolithiasis, HTN, DM, obesity, thyroid cancer s/p RAI and total thyroidectomy (1999), and prostate cancer s/p prostatectomy (2012) but with recurrence  Subjective: alert Assessment / Plan / Recommendation CHL IP CLINICAL IMPRESSIONS 09/29/2020 Clinical Impression Pt presents with a pharyngeal dysphagia that appears to be related primarily to thickening of the prevertebral tissue at the level of C4-5.  This protuberance inhibits full epiglottic closure over the larynx and directs the bolus material into the laryngeal vestibule.  With PMV  in place, the pt had a strong cough response and ejected the barium effectively; however, it tended to fall immediately back into the larynx and portions were subsequently aspirated. Oral phase was [facility].  Discussed imaging with Dr. Pietro Cassis.  Recommend continue NPO with cortrak feedings.  SLP will follow for dysphagia management. SLP Visit Diagnosis Dysphagia, pharyngeal phase (R13.13) Attention and concentration deficit following -- Frontal lobe and executive function deficit following -- Impact on safety and function Severe aspiration risk   CHL IP TREATMENT RECOMMENDATION 09/29/2020 Treatment Recommendations Therapy as outlined in treatment plan below   Prognosis 09/29/2020 Prognosis  for Safe Diet Advancement Fair Barriers to Reach Goals -- Barriers/Prognosis Comment -- CHL IP DIET RECOMMENDATION 09/29/2020 SLP Diet Recommendations NPO;Alternative means - temporary Liquid Administration via -- Medication Administration -- Compensations -- Postural Changes --   CHL IP OTHER RECOMMENDATIONS 09/29/2020 Recommended Consults Other (Comment) Oral Care Recommendations Oral care QID Other Recommendations --   CHL IP FOLLOW UP RECOMMENDATIONS 09/29/2020 Follow up Recommendations Inpatient Rehab   CHL IP FREQUENCY AND DURATION 09/29/2020 Speech Therapy Frequency (ACUTE ONLY) min 2x/week Treatment Duration 2 weeks      CHL IP ORAL PHASE 09/29/2020 Oral Phase WFL Oral - Pudding Teaspoon -- Oral - Pudding Cup -- Oral - Honey Teaspoon -- Oral - Honey Cup -- Oral - Nectar Teaspoon -- Oral - Nectar Cup -- Oral - Nectar Straw -- Oral - Thin Teaspoon -- Oral - Thin Cup -- Oral - Thin Straw -- Oral - Puree -- Oral - Mech Soft -- Oral - Regular -- Oral - Multi-Consistency -- Oral - Pill -- Oral Phase - Comment --  CHL IP PHARYNGEAL PHASE 09/29/2020 Pharyngeal Phase Impaired Pharyngeal- Pudding Teaspoon -- Pharyngeal -- Pharyngeal- Pudding Cup -- Pharyngeal -- Pharyngeal- Honey Teaspoon -- Pharyngeal -- Pharyngeal- Honey Cup -- Pharyngeal -- Pharyngeal- Nectar Teaspoon Delayed swallow initiation-vallecula;Reduced epiglottic inversion;Reduced airway/laryngeal closure;Penetration/Aspiration before swallow;Penetration/Aspiration during swallow;Penetration/Apiration after swallow;Trace aspiration;Pharyngeal residue - pyriform Pharyngeal Material enters airway, passes BELOW cords then ejected out Pharyngeal- Nectar Cup -- Pharyngeal -- Pharyngeal- Nectar Straw -- Pharyngeal -- Pharyngeal- Thin Teaspoon -- Pharyngeal -- Pharyngeal- Thin Cup -- Pharyngeal -- Pharyngeal- Thin Straw -- Pharyngeal -- Pharyngeal- Puree Delayed swallow initiation-vallecula;Reduced epiglottic inversion;Reduced airway/laryngeal  closure;Penetration/Aspiration before swallow;Penetration/Aspiration during swallow;Pharyngeal residue - pyriform Pharyngeal Material enters airway, CONTACTS cords and then ejected out Pharyngeal- Mechanical Soft -- Pharyngeal -- Pharyngeal- Regular -- Pharyngeal -- Pharyngeal- Multi-consistency -- Pharyngeal -- Pharyngeal- Pill -- Pharyngeal -- Pharyngeal Comment --  No flowsheet data found. [name] 09/29/2020, 2:58 PM              DG C-Arm 1-60 Min  Result Date: 09/15/2020 CLINICAL DATA:  Elective surgery. T9 through L1 posterior instrumentation. EXAM: THORACOLUMBAR SPINE 1V COMPARISON:  Recent chest radiograph. FINDINGS: Single frontal fluoroscopic spot view of the lower thoracic and upper lumbar spine demonstrates fusion hardware, not entirely included in the field of view. Levels are difficult to delineate on this single view. IMPRESSION: Single frontal fluoroscopic spot view of the lower thoracic and upper lumbar spine demonstrates spinal fusion hardware. Please reference operative report for details. Electronically Signed   By: Keith Rake M.D.   On: 09/15/2020 17:25   VAS [place] LOWER EXTREMITY VENOUS (DVT)  Result Date: 10/08/2020  Lower Venous DVT Study Patient Name:  Devin [name]  Date of Exam:   10/08/2020 Medical Rec #: [id number]          Accession #:    [id number] Date of  Birth: 07-15-1940          Patient Gender: M Patient Age:   79Y Exam Location:  [facility] Procedure:      VAS [place] LOWER EXTREMITY VENOUS (DVT) Referring Phys: [id number] RONDELL A SMITH --------------------------------------------------------------------------------  Indications: Immobility, rehabilitation.  Comparison Study: Prior negative left lower extremity duplex done 01/05/20 Performing Technologist: Sharion Dove RVS  Examination Guidelines: A complete evaluation includes B-mode imaging, spectral Doppler, color Doppler, and power Doppler as needed of all accessible portions of each vessel.  Bilateral testing is considered an integral part of a complete examination. Limited examinations for reoccurring indications may be performed as noted. The reflux portion of the exam is performed with the patient in reverse Trendelenburg.  +---------+---------------+---------+-----------+----------+--------------+ RIGHT    CompressibilityPhasicitySpontaneityPropertiesThrombus Aging +---------+---------------+---------+-----------+----------+--------------+ CFV      Full           Yes      Yes                                 +---------+---------------+---------+-----------+----------+--------------+ SFJ      Full                                                        +---------+---------------+---------+-----------+----------+--------------+ FV Prox  Full                                                        +---------+---------------+---------+-----------+----------+--------------+ FV Mid   Full                                                        +---------+---------------+---------+-----------+----------+--------------+ FV DistalFull                                                        +---------+---------------+---------+-----------+----------+--------------+ PFV      Full                                                        +---------+---------------+---------+-----------+----------+--------------+ POP      Full           Yes      Yes                                 +---------+---------------+---------+-----------+----------+--------------+ PTV      Full                                                        +---------+---------------+---------+-----------+----------+--------------+  PERO     Full                                                        +---------+---------------+---------+-----------+----------+--------------+   +---------+---------------+---------+-----------+----------+--------------+ LEFT      CompressibilityPhasicitySpontaneityPropertiesThrombus Aging +---------+---------------+---------+-----------+----------+--------------+ CFV      Full           Yes      Yes                                 +---------+---------------+---------+-----------+----------+--------------+ SFJ      Full                                                        +---------+---------------+---------+-----------+----------+--------------+ FV Prox  Full                                                        +---------+---------------+---------+-----------+----------+--------------+ FV Mid   Full                                                        +---------+---------------+---------+-----------+----------+--------------+ FV DistalFull                                                        +---------+---------------+---------+-----------+----------+--------------+ PFV      Full                                                        +---------+---------------+---------+-----------+----------+--------------+ POP      Full           Yes      Yes                                 +---------+---------------+---------+-----------+----------+--------------+ PTV      Full                                                        +---------+---------------+---------+-----------+----------+--------------+ PERO     Full                                                        +---------+---------------+---------+-----------+----------+--------------+  Summary: BILATERAL: - No evidence of deep vein thrombosis seen in the lower extremities, bilaterally. -   *See table(s) above for measurements and observations. Electronically signed by Harold Barban MD on 10/08/2020 at 4:25:43 PM.    Final      Subjective: Eager to resume therapy  Discharge Exam: Vitals:   10/12/20 0800 10/12/20 1118  BP: (!) 125/49   Pulse: (!) 57 (!) 56  Resp: (!) 24 20  Temp: 98.1 F (36.7 C)    SpO2: 91% 99%   Vitals:   10/12/20 0317 10/12/20 0759 10/12/20 0800 10/12/20 1118  BP: (!) 135/54  (!) 125/49   Pulse: 64 61 (!) 57 (!) 56  Resp: 18 20 (!) 24 20  Temp: 98 F (36.7 C)  98.1 F (36.7 C)   TempSrc: Oral  Oral   SpO2: 95% 93% 91% 99%  Weight:        General: Pt is alert, awake, not in acute distress Cardiovascular: RRR, S1/S2 +, no rubs, no gallops Respiratory: CTA bilaterally, no wheezing, no rhonchi Abdominal: Soft, NT, ND, bowel sounds + Extremities: no edema, no cyanosis   The results of significant diagnostics from this hospitalization (including imaging, microbiology, ancillary and laboratory) are listed below for reference.     Microbiology: Recent Results (from the past 240 hour(s))  Culture, Urine     Status: None   Collection Time: 10/07/20  2:20 PM   Specimen: Urine, Random  Result Value Ref Range Status   Specimen Description URINE, RANDOM  Final   Special Requests NONE  Final   Culture   Final    NO GROWTH Performed at [facility], [address], [place], [place] [address]    Report Status 10/08/2020 FINAL  Final     Labs: BNP (last 3 results) Recent Labs    01/04/20 1411 07/03/20 1654  BNP 125.3* 517.6*   Basic Metabolic Panel: Recent Labs  Lab 10/08/20 0254 10/09/20 0130 10/10/20 0357 10/11/20 0338 10/12/20 0249  NA 136 136 134* 133* 135  K 4.5 4.5 4.3 4.3 4.4  CL 91* 95* 94* 94* 96*  CO2 37* 36* 34* 33* 33*  GLUCOSE 93 159* 132* 127* 121*  BUN 42* 30* 32* 26* 31*  CREATININE 1.00 1.00 1.11 1.06 0.90  CALCIUM 8.8* 8.3* 8.5* 8.4* 8.7*   Liver Function Tests: Recent Labs  Lab 10/07/20 0526 10/09/20 0130 10/10/20 0357 10/11/20 0338 10/12/20 0249  AST 15 17 13* 15 16  ALT 11 11 10 10 9   ALKPHOS 118 85 91 103 107  BILITOT 0.4 0.5 0.5 0.4 0.4  PROT 5.8* 5.0* 5.1* 5.2* 5.5*  ALBUMIN 2.4* 1.9* 2.0* 2.0* 2.3*   No results for input(s): LIPASE, AMYLASE in the last 168 hours. No results for input(s): AMMONIA  in the last 168 hours. CBC: Recent Labs  Lab 10/08/20 0254 10/09/20 0130 10/10/20 0357 10/11/20 0338 10/12/20 0249  WBC 16.9* 11.9* 14.5* 11.8* 16.4*  NEUTROABS 13.5* 9.3* 11.4* 9.1* 12.4*  HGB 8.4* 7.6* 8.2* 8.3* 8.8*  HCT 27.3* 25.7* 27.4* 27.7* 29.3*  MCV 96.5 99.2 98.9 98.2 99.7  PLT 520* 433* 454* 365 449*   Cardiac Enzymes: No results for input(s): CKTOTAL, CKMB, CKMBINDEX, TROPONINI in the last 168 hours. BNP: Invalid input(s): POCBNP CBG: Recent Labs  Lab 10/11/20 2304 10/12/20 0316 10/12/20 0725 10/12/20 1128 10/12/20 1258  GLUCAP 117* 109* 144* 157* 149*   D-Dimer No results for input(s): DDIMER in the last 72 hours. Hgb A1c No  results for input(s): HGBA1C in the last 72 hours. Lipid Profile No results for input(s): CHOL, HDL, LDLCALC, TRIG, CHOLHDL, LDLDIRECT in the last 72 hours. Thyroid function studies No results for input(s): TSH, T4TOTAL, T3FREE, THYROIDAB in the last 72 hours.  Invalid input(s): FREET3 Anemia work up No results for input(s): VITAMINB12, FOLATE, FERRITIN, TIBC, IRON, RETICCTPCT in the last 72 hours. Urinalysis    Component Value Date/Time   COLORURINE YELLOW 10/07/2020 1622   APPEARANCEUR CLEAR 10/07/2020 1622   LABSPEC 1.016 10/07/2020 1622   PHURINE 6.0 10/07/2020 1622   GLUCOSEU NEGATIVE 10/07/2020 1622   GLUCOSEU NEGATIVE 03/05/2016 1203   [facility] 10/07/2020 1622   BILIRUBINUR NEGATIVE 10/07/2020 1622   KETONESUR NEGATIVE 10/07/2020 1622   PROTEINUR NEGATIVE 10/07/2020 1622   UROBILINOGEN 0.2 03/05/2016 1203   NITRITE NEGATIVE 10/07/2020 1622   LEUKOCYTESUR NEGATIVE 10/07/2020 1622   Sepsis Labs Invalid input(s): PROCALCITONIN,  WBC,  LACTICIDVEN Microbiology Recent Results (from the past 240 hour(s))  Culture, Urine     Status: None   Collection Time: 10/07/20  2:20 PM   Specimen: Urine, Random  Result Value Ref Range Status   Specimen Description URINE, RANDOM  Final   Special Requests NONE  Final    Culture   Final    NO GROWTH Performed at [facility], [place] [address], [place], [place] [address]    Report Status 10/08/2020 FINAL  Final   Time spent:36mn  SIGNED:   SMarylu Lund MD  [facility] 10/12/2020, 2:05 PM  If 7PM-7AM, please contact night-coverage

## 2020-10-12 NOTE — H&P (Signed)
Physical Medicine and Rehabilitation Admission H&P    CC: Debility due to T11 chance fracture/VDRF/medical issues.    HPI:  Devin [name] is a 80 year old male with history of HTN, T2DM,  Crohn's disease, A fib, Lung cancer s/p LUL lobectomy, restrictive lung disease, ankylosing spondylosis who sustained a fall on 09/07/2020 with progressive pain.  He was admitted on 09/10/2020 with acute on chronic hypercarbic respiratory failure, T11 Chance fracture and agitation.  He underwent posterior thoracic fusion T11-T12 on 04/08 by Dr. Ronnald Ramp and had difficulty tolerating extubation.  He ultimately required tracheostomy on 04/12 and continued to have issues with copious secretion via trach, developed Kleb pneumonia and was treated with course of ceftriaxone.  He also had issues with significant diarrhea due to inability to take Pentasa through his core track and n.p.o. status.  Dr. Hilarie Fredrickson  was consulted for input and recommended managing diarrhea with rectal tube as well as Lomotil with probiotics until able to tolerate p.o.'s.  He was extubated to ATC and was requiring 8 to 10 L via ATC as well as frequent suctioning due to copious secretions.  He had issues with hyperkalemia as well as AKI and nephrology recommended changing of tube feeds to nephro and he was treated with Lokelma, Kayexalate, IV lasix, insulin as well as sodium bicarb with improvement. He underwent biopsy of supraglottic mass (seen on MBS) by Dr. Lestine Mount negative for malignancy. He was admitted to CIR on 04/29 for comprehensive inpatient rehab and was found to have urinary retention requiring in and out catheterization.  On a.m. of 04/30, patient had pulled out his feeding tube and NG tube was extremely difficult to place and tube feeds were held with subsequent hypoglycemic episodes.  Chest x-ray done showing concerns of aspiration pneumonia with bilateral pleural effusions and he was started on broad-spectrum antibiotic.   He did develop encephalopathy with worsening of hypoxia, concerns of sepsis and was transferred to acute floor for management.  Coretak  was replaced with resumption of tube feeds and water flushes increased to help manage AKI.   PCCM consulted on 05/03 due to difficulty to inability to pass suction via trach sue to obstruction. He underwent bronchoscopy with placement of XLT shiley by Marni Griffon NP. CT neck done revealing improvement in retropharyngeal fullness on right, negative for subglottic stenosis and moderately large right pleural effusion. PCCM felt that no intervention needed for effusion as patient without symptoms and to keep XLT in place for 2 weeks to allow for irritation to resolve and have ENT re-evaluate for input and to decannulate in OR for inspection of entire airway. As procalcitonin trending down with resolution of encephalopathy, antibiotics narrowed to Zosyn.  Tube feeds changed back to Glucerna by dietitian due to concerns of severe malnutrition. Therapy resumed and patient continues to be limited by generalized weakness, BLE weakness with back pain, decreased activity tolerance and balance deficits. CIR recommended due to functional decline.    Pt very sleepy this AM- said due to receiving Oxycodone at 7-8 am and Xanax at 9-10 am- saw pt at 2pm. Hard to wake up- wouldn't stay awake.  Per family concerned that Xanax should be "prn", but also wants it at TID- since sometimes cannot sleep at night- will order something for sleep vs trying to give additional Xanax, I explained.  Took mesalamine for Crohn's (that's what family mentioned- could be wrong) and cannot take since NPO, so needs rectal tube, due to liquid stools/Crohn's no control.  Also just had Foley removed- hasn't voided yet- would benefit from Flomax, but is NPO.   Also asked me to change Oxy to Norco.  -both were done.   Review of Systems  Constitutional: Negative for chills and fever.  HENT: Negative for hearing  loss and tinnitus.   Eyes: Negative for blurred vision and double vision.  Respiratory: Positive for cough and shortness of breath.   Cardiovascular: Positive for palpitations and leg swelling. Negative for chest pain.  Gastrointestinal: Positive for diarrhea. Negative for heartburn and nausea.       Had started having issues with swallowing/? GERD prior to fall.   Genitourinary: Negative for dysuria and urgency.  Skin: Negative for rash.  Neurological: Positive for weakness. Negative for dizziness and headaches.  All other systems reviewed and are negative.    Past Medical History:  Diagnosis Date  . A-fib ([place])   . 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 Ankylosing spondylitis ([place])    Diagnosed during lumbar fracture summer of 2016    . Crohn's disease ([place])   . Gout   . HIstory of basal cell cancer of face    THYROID CA HX  . History of kidney stones   . Hypertension   . Hypothyroidism   . Impotence   . 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 Obesity (BMI 30-39.9)   . Osteoporosis    Pt completed 5 years of fosamax in 2013     . Prostate cancer with recurrence    Treated with prostatectomy with recurrence 2012 with Lupron treatment now him   . Psoriasis   . Thyroid cancer ([place]) 1999   Treated with RAI and total thyroidectomy   . Type II diabetes mellitus ([place])     Past Surgical History:  Procedure Laterality Date  . ABDOMINAL EXPLORATION SURGERY     for small bowel obstruction  . APPENDECTOMY  03/2007  . BACK SURGERY    . BASAL CELL CARCINOMA EXCISION  "several"   "head"  . CARDIAC CATHETERIZATION  03/17/2003  . CHOLECYSTECTOMY N/A 05/07/2016   Procedure: LAPAROSCOPIC CHOLECYSTECTOMY;  Surgeon: Fanny Skates, MD;  Location: [facility];  Service: General;  Laterality: N/A;  . COLON SURGERY  03/2007   Resection of cecum, appendix, terminal ileum (approximately/notes 10/10/2010  . CYSTOSCOPY/URETEROSCOPY/HOLMIUM LASER/STENT  PLACEMENT Right 07/03/2020   Procedure: CYSTOSCOPY/RETROGRADE/URETEROSCOPY REMOVAL OF BLADDER STONE;  Surgeon: Raynelle Bring, MD;  Location: [facility];  Service: Urology;  Laterality: Right;  . DIRECT LARYNGOSCOPY N/A 10/04/2020   Procedure: DIRECT LARYNGOSCOPY, PHARYNGOSCOPY WITH BIOPSY;  Surgeon: [name], DO;  Location: [facility];  Service: ENT;  Laterality: N/A;  . ESOPHAGOSCOPY N/A 10/04/2020   Procedure: ESOPHAGOSCOPY WITH BIOPSY;  Surgeon: [name], DO;  Location: [facility];  Service: ENT;  Laterality: N/A;  . HERNIA REPAIR    . LAMINECTOMY WITH POSTERIOR LATERAL ARTHRODESIS LEVEL 1 N/A 09/15/2020   Procedure: REVISION OF THORACOLUMBAR FUSION, ADDITION OF CROSS-CONNECTORS;  Surgeon: Eustace Moore, MD;  Location: [facility];  Service: Neurosurgery;  Laterality: N/A;  . LAPAROSCOPIC CHOLECYSTECTOMY  05/07/2016  . LAPAROSCOPIC LYSIS OF ADHESIONS  05/07/2016  . LAPAROSCOPIC LYSIS OF ADHESIONS N/A 05/07/2016   Procedure: LAPAROSCOPIC LYSIS OF ADHESIONS TIMES ONE HOUR;  Surgeon: Fanny Skates, MD;  Location: [facility];  Service: General;  Laterality: N/A;  . POSTERIOR FUSION THORACIC SPINE  02/08/2016   1. Posterior thoracic arthrodesis T7-T11 utilizing morcellized allograft, 2. Posterior thoracic segmental fixation T7-T11 utilizing  nuvasive pedicle screws  . PROSTATECTOMY  06/2001   w/bilateral pelvic lymph nose dissection/notes 10/24/2010  . SPINAL FUSION  12/2014   Open reduction internal fixation of L1 Chance fracture with posterior fusion T10-L4 utilizing morcellized allograft and some local autograft, segmental instrumentation T10-L4 inclusive utilizing nuvasive pedicle screws/notes 12/16/2014  . Stress Cardiolite  02/17/2003  . THOROCOTOMY WITH LOBECTOMY  03/16/2015   Procedure: THOROCOTOMY WITH LOBECTOMY;  Surgeon: Ivin Poot, MD;  Location: [facility];  Service: Thoracic;;  . TONSILLECTOMY    . TOTAL THYROIDECTOMY  1997  . Venous Doppler  05/30/2004  . VENTRAL HERNIA REPAIR  04/14/2008   . VIDEO ASSISTED THORACOSCOPY Left 03/16/2015   Procedure: VIDEO ASSISTED THORACOSCOPY;  Surgeon: Ivin Poot, MD;  Location: [facility];  Service: Thoracic;  Laterality: Left;    Family History  Problem Relation Age of Onset  . CAD Mother   . Cancer Neg Hx        No cancer in the patient's immediate family, except of course for the patient himself, as noted    Social History:  reports that he quit smoking about 30 years ago. His smoking use included cigarettes. He has a 60.00 pack-year smoking history. He has never used smokeless tobacco. He reports previous alcohol use. He reports that he does not use drugs.    Allergies: No Known Allergies    Medications Prior to Admission  Medication Sig Dispense Refill  . acetaminophen (TYLENOL) 160 MG/5ML solution Place 15.6 mLs (500 mg total) into feeding tube every 6 (six) hours. 120 mL 0  . acetylcysteine (MUCOMYST) 20 % nebulizer solution Take 3 mLs (600 mg total) by nebulization 2 (two) times daily. 30 mL 12  . albuterol (VENTOLIN HFA) 108 (90 Base) MCG/ACT inhaler Inhale 2 puffs into the lungs every 6 (six) hours as needed for wheezing or shortness of breath. 8 g 6  . ALPRAZolam (XANAX) 0.5 MG tablet Take 0.5 mg by mouth 2 (two) times daily as needed for anxiety.    Marland Kitchen apixaban (ELIQUIS) 5 MG TABS tablet Take 5 mg by mouth 2 (two) times daily.    Marland Kitchen atenolol (TENORMIN) 25 MG tablet Take 1 tablet (25 mg total) by mouth 2 (two) times daily.    . B-D ULTRAFINE III SHORT PEN 31G X 8 MM MISC USE 1 PEN NEEDLE FOUR TIMES DAILY. 100 each 0  . bethanechol (URECHOLINE) 5 MG tablet 1 tablet (5 mg total) by Per NG tube route 4 (four) times daily.    . budesonide (PULMICORT) 0.5 MG/2ML nebulizer solution Take 2 mLs (0.5 mg total) by nebulization 2 (two) times daily.  12  . busPIRone (BUSPAR) 15 MG tablet Take 15 mg by mouth 2 (two) times daily.     . calcium-vitamin D (OSCAL WITH D) 500-200 MG-UNIT tablet Take 2 tablets by mouth daily.     . cholestyramine  (QUESTRAN) 4 g packet Place 1 packet (4 g total) into feeding tube 2 (two) times daily before lunch and supper. 60 each 12  . diltiazem (CARDIZEM) 10 mg/ml oral suspension Place 3 mLs (30 mg total) into feeding tube every 6 (six) hours.    . diphenhydrAMINE (BENADRYL) 25 mg capsule 1 capsule (25 mg total) by Per NG tube route at bedtime as needed for sleep. 30 capsule 0  . ferric gluconate 250 mg in sodium chloride 0.9 % 100 mL Inject 250 mg into the vein daily.    . fiber (NUTRISOURCE FIBER) PACK packet Place 1 packet  into feeding tube 2 (two) times daily.    . Glucose Blood (ASCENSIA CONTOUR TEST VI) Use as directed to test two times a day    . glucose blood (BAYER CONTOUR TEST) test strip TEST BLOOD SUGAR TWICE DAILY AS DIRECTED. 200 each 2  . glucose blood test strip 1 each by Other route 2 times daily.    Marland Kitchen guaiFENesin-dextromethorphan (ROBITUSSIN DM) 100-10 MG/5ML syrup Place 5 mLs into feeding tube every 8 (eight) hours. 118 mL 0  . insulin aspart (NOVOLOG) 100 UNIT/ML injection Inject 0-20 Units into the skin every 4 (four) hours. 10 mL 11  . insulin glargine (LANTUS) 100 UNIT/ML injection Inject 0.12 mLs (12 Units total) into the skin 2 (two) times daily. 10 mL 11  . leuprolide (LUPRON) 11.25 MG injection Inject 11.25 mg into the muscle every 6 (six) months.     . levothyroxine (SYNTHROID) 125 MCG tablet Take 1 tablet (125 mcg total) by mouth daily before breakfast. 30 tablet 0  . loperamide HCl (IMODIUM) 1 MG/7.5ML suspension Place 15 mLs (2 mg total) into feeding tube 2 (two) times daily as needed for diarrhea or loose stools. 120 mL 0  . loperamide HCl (IMODIUM) 1 MG/7.5ML suspension Place 30 mLs (4 mg total) into feeding tube 4 (four) times daily. 120 mL 0  . Mesalamine 800 MG TBEC Take 1,600 mg by mouth 2 (two) times daily.    . methocarbamol (ROBAXIN) 500 MG tablet Take 500 mg by mouth every 8 (eight) hours.    . Multiple Vitamins-Iron (DAILY-VITE/IRON/BETA-CAROTENE) TABS Take 1  tablet by mouth daily.     . nutrition supplement, JUVEN, (JUVEN) PACK Place 1 packet into feeding tube 2 (two) times daily between meals.  0  . Nutritional Supplements (FEEDING SUPPLEMENT, NEPRO CARB STEADY,) LIQD Place 1,000 mLs into feeding tube continuous.  0  . Nutritional Supplements (FEEDING SUPPLEMENT, PROSOURCE TF,) liquid Place 45 mLs into feeding tube daily.    Marland Kitchen Nystatin (GERHARDT'S BUTT CREAM) CREA Apply 1 application topically 2 (two) times daily.    . ondansetron (ZOFRAN) 4 MG/2ML SOLN injection Inject 2 mLs (4 mg total) into the vein every 6 (six) hours as needed for nausea or vomiting. 2 mL 0  . oxyCODONE (ROXICODONE) 5 MG/5ML solution Place 5 mLs (5 mg total) into feeding tube every 4 (four) hours as needed for moderate pain.  0  . phenol (CHLORASEPTIC) 1.4 % LIQD Use as directed 1 spray in the mouth or throat as needed for throat irritation / pain.  0  . revefenacin (YUPELRI) 175 MCG/3ML nebulizer solution Take 3 mLs (175 mcg total) by nebulization daily. 90 mL   . Tiotropium Bromide Monohydrate (SPIRIVA RESPIMAT) 2.5 MCG/ACT AERS Inhale 2 puffs into the lungs daily. 4 g 0  . Water For Irrigation, Sterile (FREE WATER) SOLN Place 100 mLs into feeding tube every 8 (eight) hours.    Marland Kitchen zinc gluconate 50 MG tablet Take 50 mg by mouth daily.      Drug Regimen Review  Drug regimen was reviewed and remains appropriate with no significant issues identified  Home:     Functional History:    Functional Status:  Mobility:          ADL:    Cognition:       There were no vitals taken for this visit. Physical Exam Vitals and nursing note reviewed. Exam conducted with a chaperone present.  Constitutional:      Comments: Pt sitting up in bed- sleeping with  mouth open- very dry oropharynx- daughter, wife and nurse at bedside; per wife, pt is claustrophobic and needs communication board immediately in [place], pt calm/sleepy, NAD  HENT:     Head: Normocephalic and atraumatic.      Comments: Has yellow scabs on nose; Cortrak in nare.     Right Ear: External ear normal.     Left Ear: External ear normal.     Nose: Nose normal. No congestion.     Mouth/Throat:     Mouth: Mucous membranes are dry.     Pharynx: Oropharynx is clear. No oropharyngeal exudate.     Comments: Kept trying to suction thick secretions- weren't really coming up Eyes:     General:        Right eye: No discharge.        Left eye: No discharge.     Extraocular Movements: Extraocular movements intact.     Conjunctiva/sclera: Conjunctivae normal.  Neck:     Comments: Trach in place XLT- NO PMV- O2 via trach- FiO2 appears 28% Cardiovascular:     Rate and Rhythm: Normal rate and regular rhythm.     Heart sounds: Normal heart sounds. No murmur heard. No gallop.   Pulmonary:     Comments: Rales on the right and LLL.  Also diffusely junky/rales heard- couldn't cough up as well as he'd/we would like- nurse said would suction pt. Abdominal:     Comments: Soft, NT, ND, (+)BS    Genitourinary:    Comments: Foley out Rectal tube in- almost full bag Musculoskeletal:     Comments: UEs 5/5 B/L- in Biceps, triceps, WE, grip and FA LEs- 4+/5 in HF/KE/KF/DF and PF B/L  Skin:    General: Skin is warm and dry.     Comments: Multiple ecchymosis bilateral forearms. - also included inside of fingers in multiple fingers IV L forearm- OK Due to pt's trach, being given TFs, couldn't turn to look at backside- per nurse, only using Gerhardt's, and described no open wounds  Neurological:     Comments: Fatigued appearing and had difficulty staying awake. Dysphonia with minimal phonation with PMSV.   Intact to light touch in all 4 extremities  Psychiatric:     Comments: Flat, cordial- nonverbal due to lack of PMV; very sedated     Results for orders placed or performed during the hospital encounter of 10/07/20 (from the past 48 hour(s))  Glucose, capillary     Status: Abnormal   Collection Time:  10/10/20  7:42 PM  Result Value Ref Range   Glucose-Capillary 147 (H) 70 - 99 mg/dL    Comment: Glucose reference range applies only to samples taken after fasting for at least 8 hours.  Glucose, capillary     Status: Abnormal   Collection Time: 10/10/20 11:49 PM  Result Value Ref Range   Glucose-Capillary 115 (H) 70 - 99 mg/dL    Comment: Glucose reference range applies only to samples taken after fasting for at least 8 hours.  Procalcitonin     Status: None   Collection Time: 10/11/20  3:38 AM  Result Value Ref Range   Procalcitonin 1.90 ng/mL    Comment:        Interpretation: PCT > 0.5 ng/mL and <= 2 ng/mL: Systemic infection (sepsis) is possible, but other conditions are known to elevate PCT as well. (NOTE)       Sepsis PCT Algorithm           Lower Respiratory Tract  Infection PCT Algorithm    ----------------------------     ----------------------------         PCT < 0.25 ng/mL                PCT < 0.10 ng/mL          Strongly encourage             Strongly discourage   discontinuation of antibiotics    initiation of antibiotics    ----------------------------     -----------------------------       PCT 0.25 - 0.50 ng/mL            PCT 0.10 - 0.25 ng/mL               OR       >80% decrease in PCT            Discourage initiation of                                            antibiotics      Encourage discontinuation           of antibiotics    ----------------------------     -----------------------------         PCT >= 0.50 ng/mL              PCT 0.26 - 0.50 ng/mL                AND       <80% decrease in PCT             Encourage initiation of                                             antibiotics       Encourage continuation           of antibiotics    ----------------------------     -----------------------------        PCT >= 0.50 ng/mL                  PCT > 0.50 ng/mL               AND         increase in PCT                   Strongly encourage                                      initiation of antibiotics    Strongly encourage escalation           of antibiotics                                     -----------------------------                                           PCT <= 0.25 ng/mL  OR                                        > 80% decrease in PCT                                      Discontinue / Do not initiate                                             antibiotics  Performed at [facility], [place] [address], [place], [place] [address]   CBC with Differential/Platelet     Status: Abnormal   Collection Time: 10/11/20  3:38 AM  Result Value Ref Range   WBC 11.8 (H) 4.0 - 10.5 K/uL   RBC 2.82 (L) 4.22 - 5.81 MIL/uL   Hemoglobin 8.3 (L) 13.0 - 17.0 g/dL   HCT 27.7 (L) 39.0 - 52.0 %   MCV 98.2 80.0 - 100.0 fL   MCH 29.4 26.0 - 34.0 pg   MCHC 30.0 30.0 - 36.0 g/dL   RDW 14.8 11.5 - 15.5 %   Platelets 365 150 - 400 K/uL   nRBC 0.0 0.0 - 0.2 %   Neutrophils Relative % 77 %   Neutro Abs 9.1 (H) 1.7 - 7.7 K/uL   Lymphocytes Relative 9 %   Lymphs Abs 1.0 0.7 - 4.0 K/uL   Monocytes Relative 8 %   Monocytes Absolute 0.9 0.1 - 1.0 K/uL   Eosinophils Relative 3 %   Eosinophils Absolute 0.4 0.0 - 0.5 K/uL   Basophils Relative 0 %   Basophils Absolute 0.0 0.0 - 0.1 K/uL   Immature Granulocytes 3 %   Abs Immature Granulocytes 0.40 (H) 0.00 - 0.07 K/uL    Comment: Performed at [facility], [place] [address], [place], [name] [address]  Comprehensive metabolic panel     Status: Abnormal   Collection Time: 10/11/20  3:38 AM  Result Value Ref Range   Sodium 133 (L) 135 - 145 mmol/L   Potassium 4.3 3.5 - 5.1 mmol/L   Chloride 94 (L) 98 - 111 mmol/L   CO2 33 (H) 22 - 32 mmol/L   Glucose, Bld 127 (H) 70 - 99 mg/dL    Comment: Glucose reference range applies only to samples taken after fasting for at least 8 hours.   BUN 26 (H) 8 - 23 mg/dL    Creatinine, Ser 1.06 0.61 - 1.24 mg/dL   Calcium 8.4 (L) 8.9 - 10.3 mg/dL   Total Protein 5.2 (L) 6.5 - 8.1 g/dL   Albumin 2.0 (L) 3.5 - 5.0 g/dL   AST 15 15 - 41 U/L   ALT 10 0 - 44 U/L   Alkaline Phosphatase 103 38 - 126 U/L   Total Bilirubin 0.4 0.3 - 1.2 mg/dL   GFR, Estimated >60 >60 mL/min    Comment: (NOTE) Calculated using the CKD-EPI Creatinine Equation (2021)    Anion gap 6 5 - 15    Comment: Performed at [facility], [place] [address], [place], [place] [address]  Glucose, capillary     Status: Abnormal   Collection Time: 10/11/20  4:42 AM  Result Value Ref Range  Glucose-Capillary 122 (H) 70 - 99 mg/dL    Comment: Glucose reference range applies only to samples taken after fasting for at least 8 hours.  Glucose, capillary     Status: Abnormal   Collection Time: 10/11/20  7:33 AM  Result Value Ref Range   Glucose-Capillary 125 (H) 70 - 99 mg/dL    Comment: Glucose reference range applies only to samples taken after fasting for at least 8 hours.  Glucose, capillary     Status: Abnormal   Collection Time: 10/11/20 12:22 PM  Result Value Ref Range   Glucose-Capillary 149 (H) 70 - 99 mg/dL    Comment: Glucose reference range applies only to samples taken after fasting for at least 8 hours.  Glucose, capillary     Status: Abnormal   Collection Time: 10/11/20  4:17 PM  Result Value Ref Range   Glucose-Capillary 148 (H) 70 - 99 mg/dL    Comment: Glucose reference range applies only to samples taken after fasting for at least 8 hours.  Glucose, capillary     Status: Abnormal   Collection Time: 10/11/20  7:27 PM  Result Value Ref Range   Glucose-Capillary 160 (H) 70 - 99 mg/dL    Comment: Glucose reference range applies only to samples taken after fasting for at least 8 hours.  Glucose, capillary     Status: Abnormal   Collection Time: 10/11/20 11:04 PM  Result Value Ref Range   Glucose-Capillary 117 (H) 70 - 99 mg/dL    Comment: Glucose reference range applies only  to samples taken after fasting for at least 8 hours.  CBC with Differential/Platelet     Status: Abnormal   Collection Time: 10/12/20  2:49 AM  Result Value Ref Range   WBC 16.4 (H) 4.0 - 10.5 K/uL   RBC 2.94 (L) 4.22 - 5.81 MIL/uL   Hemoglobin 8.8 (L) 13.0 - 17.0 g/dL   HCT 29.3 (L) 39.0 - 52.0 %   MCV 99.7 80.0 - 100.0 fL   MCH 29.9 26.0 - 34.0 pg   MCHC 30.0 30.0 - 36.0 g/dL   RDW 15.2 11.5 - 15.5 %   Platelets 449 (H) 150 - 400 K/uL   nRBC 0.0 0.0 - 0.2 %   Neutrophils Relative % 74 %   Neutro Abs 12.4 (H) 1.7 - 7.7 K/uL   Lymphocytes Relative 10 %   Lymphs Abs 1.6 0.7 - 4.0 K/uL   Monocytes Relative 8 %   Monocytes Absolute 1.2 (H) 0.1 - 1.0 K/uL   Eosinophils Relative 2 %   Eosinophils Absolute 0.4 0.0 - 0.5 K/uL   Basophils Relative 1 %   Basophils Absolute 0.1 0.0 - 0.1 K/uL   Immature Granulocytes 5 %   Abs Immature Granulocytes 0.75 (H) 0.00 - 0.07 K/uL    Comment: Performed at [facility], [address] [place],  [address]  Comprehensive metabolic panel     Status: Abnormal   Collection Time: 10/12/20  2:49 AM  Result Value Ref Range   Sodium 135 135 - 145 mmol/L   Potassium 4.4 3.5 - 5.1 mmol/L   Chloride 96 (L) 98 - 111 mmol/L   CO2 33 (H) 22 - 32 mmol/L   Glucose, Bld 121 (H) 70 - 99 mg/dL    Comment: Glucose reference range applies only to samples taken after fasting for at least 8 hours.   BUN 31 (H) 8 - 23 mg/dL   Creatinine, Ser 0.90 0.61 - 1.24 mg/dL  Calcium 8.7 (L) 8.9 - 10.3 mg/dL   Total Protein 5.5 (L) 6.5 - 8.1 g/dL   Albumin 2.3 (L) 3.5 - 5.0 g/dL   AST 16 15 - 41 U/L   ALT 9 0 - 44 U/L   Alkaline Phosphatase 107 38 - 126 U/L   Total Bilirubin 0.4 0.3 - 1.2 mg/dL   GFR, Estimated >60 >60 mL/min    Comment: (NOTE) Calculated using the CKD-EPI Creatinine Equation (2021)    Anion gap 6 5 - 15    Comment: Performed at [facility] [address], [place], [place] [address]  Glucose, capillary     Status: Abnormal    Collection Time: 10/12/20  3:16 AM  Result Value Ref Range   Glucose-Capillary 109 (H) 70 - 99 mg/dL    Comment: Glucose reference range applies only to samples taken after fasting for at least 8 hours.  Glucose, capillary     Status: Abnormal   Collection Time: 10/12/20  7:25 AM  Result Value Ref Range   Glucose-Capillary 144 (H) 70 - 99 mg/dL    Comment: Glucose reference range applies only to samples taken after fasting for at least 8 hours.  Glucose, capillary     Status: Abnormal   Collection Time: 10/12/20 11:28 AM  Result Value Ref Range   Glucose-Capillary 157 (H) 70 - 99 mg/dL    Comment: Glucose reference range applies only to samples taken after fasting for at least 8 hours.  Glucose, capillary     Status: Abnormal   Collection Time: 10/12/20 12:58 PM  Result Value Ref Range   Glucose-Capillary 149 (H) 70 - 99 mg/dL    Comment: Glucose reference range applies only to samples taken after fasting for at least 8 hours.  Glucose, capillary     Status: Abnormal   Collection Time: 10/12/20  4:47 PM  Result Value Ref Range   Glucose-Capillary 122 (H) 70 - 99 mg/dL    Comment: Glucose reference range applies only to samples taken after fasting for at least 8 hours.   No results found.     Medical Problem List and Plan: 1.  T11/12 compression fx s/p posterior fusion secondary to fall with debility due to asp PNA/trach/generalzied weakness  -patient may not shower for now- get PMV in place regularly and rectal tube out  -ELOS/Goals: 2-3 weeks- min A 2.  Antithrombotics: -DVT/anticoagulation:  Pharmaceutical: Other (comment)--Apixaban bid.   -antiplatelet therapy: N/A 3. Pain Management:  Will change Oxycodone to hydrocodone prn to avoid sedative SE- family agrees on plan. Tramadol prn for moderate pain.   4. Mood: LCSW to follow for evaluation and support.   -antipsychotic agents: N/A 5. Neuropsych: This patient is not fully capable of making decisions on his own behalf. 6.  Skin/Wound Care: Pressure relief measures.  7. Fluids/Electrolytes/Nutrition: NPO with tube feeds for nutritional support.  --dietician to assist with input to maintain nutritional status.   --BUN improved from 64->31. Will decrease fluid boluses to every 6 hours to avoid overload. 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    --PCCM to follow for change and input on scope/decannulation in the future. Also wants ENT to help with possible tracheal stenosis?  --suction frequently with chest PT to help mobilize secretions. Also has vest to help with secretions 9. Acute on chronic respiratory failure: Encourage pulmonary hygiene   --continue Pulmicort nebs bid.  10.  PAF:  Monitor HR tid--continue Atenolol bid with  Cardizem tid.  10. Hyponatremia: Has resolved on today's labs. Continue to monitor.  11. Acute blood loss anemia: Monitor with serial checks. Stable.  12. Leucocytosis: WBC continues to fluctuate. Monitor for signs of infection. 78. Crohn's disease: Continue rectal tube with probiotic and imodium.  14.  Bilateral pleural effusion: Pulmonary hygiene w/ flutter valve.   --continue to monitor for symptoms with increase in activity.   15. Aspiration PNA: On Zosyn--antibiotic day# 6 16. Dysphagia: Continue NPO with Glucerna @ 55 cc/hr  --water flushes 170 cc qid.  53. T2DM: Was on Humalog tid prior to admission.  --will monitor BS every 4 hours as on tube feeds and use SSI prn  18. Anxiety- reduced Xanax to prn BID- wife wants TID prn; suggested adding sleeping meds- will add Trazodone 50 mg QHS prn if needed.    Bary Leriche- PA-C 10/12/2020    I have personally performed a face to face diagnostic evaluation of this patient and formulated the key components of the plan.  Additionally, I have personally reviewed laboratory data, imaging studies, as well as relevant notes and concur with the physician assistant's documentation  above.       Courtney Heys, MD 10/12/2020

## 2020-10-12 NOTE — Plan of Care (Signed)
  Problem: Pain Managment: Goal: General experience of comfort will improve Outcome: Progressing   Problem: Safety: Goal: Ability to remain free from injury will improve Outcome: Progressing   Problem: Skin Integrity: Goal: Risk for impaired skin integrity will decrease Outcome: Progressing   

## 2020-10-12 NOTE — Progress Notes (Signed)
NAME:  Devin [name], MRN:  [id number], DOB:  Oct 09, 1940, LOS: 5 ADMISSION DATE:  10/07/2020, CONSULTATION DATE:  5/3 REFERRING MD:  Verlon Au, CHIEF COMPLAINT:  Dyspnea   History of Present Illness:  This is a 80 year old male who we had been following s/p fall resulting in T11/12 fracture requiring requiring fusion and complicated by prolonged mechanical ventilation and failed extubation attempt x 2 requiring trach. He had been dc'd to rehab. Re-admitted to hospital  after cor trak dislodged and had aspiration PNA c/b hypoglycemia and delirium.  Critical care emergently consulted 2/2 trach obstruction on 5/3  Pertinent  Medical History  afib on chronic ac, DM type II, prior lobectomy (LUL), chron's disease, thyroid cancer, prostate cancer, T11-T12 fracture, required posterior fusion, failed extubation 2/2 upper-airway obstruction. Required trach. Went to rehab.   Recent soft tissue mass at level of supraglottic larynx->neg for malignancy (this was incidentally identified initially on MBS showing thickening prevertebral tissue w/ CT head/neck showing prevertebral soft tissue mass concerning for malignancy   Significant Hospital Events: Including procedures, antibiotic start and stop dates in addition to other pertinent events    4/29 admitted for cortrak malposition, aspiration and encephalopathy  5/3 PCCM emergently consulted for inability to suction and upper airway obstruction Had sig increase of inflammatory tissue on posterior wall of trachea. Was able to bypass with distal XLT. Still has some intermittent tracheomalacia w/ dynamic collapse of airway noted w/ cough   .   Interim History / Subjective:  No distress.  Resting comfortably Objective   Blood pressure (Abnormal) 125/49, pulse (Abnormal) 57, temperature 98.1 F (36.7 C), temperature source Oral, resp. rate (Abnormal) 24, weight 91.3 kg, SpO2 91 %.    FiO2 (%):  [28 %] 28 %   Intake/Output Summary (Last 24 hours) at  10/12/2020 0813 Last data filed at 10/12/2020 0700 Gross per 24 hour  Intake 3204 ml  Output 2875 ml  Net 329 ml   Filed Weights   10/08/20 0432 10/09/20 0500  Weight: 91.5 kg 91.3 kg    Examination:  General 80 year old white male lying in bed no acute distress HEENT normocephalic atraumatic the size 6 distal XLT cuffless tracheostomy is in place there is no drainage or discharge from this he is tolerating well currently 40% trach collar Pulmonary: Some scattered rhonchi no accessory use diminished bases Abdomen: Soft nontender Cardiac: Regular rate and rhythm Neuro: Awake oriented no focal deficits is diffusely weak GU: Voids  Labs/imaging that I havepersonally reviewed  (right click and "Reselect all SmartList Selections" daily)  WBC 11.8 Hgb 8.3 Na 133  Resolved Hospital Problem list     Assessment & Plan:  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 Often times this can resolve with repositioning of trach which we did by placing XLT distal.  He is doing well status post change  Plan: We will continue to assess him weekly, with plan to change trach out again in 2 weeks  Will be very conservative in regards of our approach to decannulation with plan to work in collaboration with ENT, he will need airway assessment before decannulation which a trip to the OR so that both the upper supraglottal area as well as subglottal area can be assessed  Continue rehabilitation focus we will see him again next week  Best practice (right click and "Reselect  all SmartList Selections" daily)   Per primary   Erick Colace ACNP-BC [place] Pager # [phone] OR # [phone] if no answer

## 2020-10-13 ENCOUNTER — Encounter (HOSPITAL_COMMUNITY): Payer: Self-pay | Admitting: Physical Medicine & Rehabilitation

## 2020-10-13 ENCOUNTER — Other Ambulatory Visit: Payer: Self-pay

## 2020-10-13 DIAGNOSIS — J690 Pneumonitis due to inhalation of food and vomit: Secondary | ICD-10-CM

## 2020-10-13 DIAGNOSIS — E119 Type 2 diabetes mellitus without complications: Secondary | ICD-10-CM

## 2020-10-13 DIAGNOSIS — R1312 Dysphagia, oropharyngeal phase: Secondary | ICD-10-CM

## 2020-10-13 DIAGNOSIS — Z930 Tracheostomy status: Secondary | ICD-10-CM

## 2020-10-13 LAB — CBC WITH DIFFERENTIAL/PLATELET
Abs Immature Granulocytes: 0.55 10*3/uL — ABNORMAL HIGH (ref 0.00–0.07)
Basophils Absolute: 0.1 10*3/uL (ref 0.0–0.1)
Basophils Relative: 0 %
Eosinophils Absolute: 0.3 10*3/uL (ref 0.0–0.5)
Eosinophils Relative: 2 %
HCT: 28.1 % — ABNORMAL LOW (ref 39.0–52.0)
Hemoglobin: 8.4 g/dL — ABNORMAL LOW (ref 13.0–17.0)
Immature Granulocytes: 4 %
Lymphocytes Relative: 8 %
Lymphs Abs: 1.1 10*3/uL (ref 0.7–4.0)
MCH: 29.6 pg (ref 26.0–34.0)
MCHC: 29.9 g/dL — ABNORMAL LOW (ref 30.0–36.0)
MCV: 98.9 fL (ref 80.0–100.0)
Monocytes Absolute: 0.7 10*3/uL (ref 0.1–1.0)
Monocytes Relative: 5 %
Neutro Abs: 10.8 10*3/uL — ABNORMAL HIGH (ref 1.7–7.7)
Neutrophils Relative %: 81 %
Platelets: 361 10*3/uL (ref 150–400)
RBC: 2.84 MIL/uL — ABNORMAL LOW (ref 4.22–5.81)
RDW: 15.7 % — ABNORMAL HIGH (ref 11.5–15.5)
WBC: 13.5 10*3/uL — ABNORMAL HIGH (ref 4.0–10.5)
nRBC: 0 % (ref 0.0–0.2)

## 2020-10-13 LAB — GLUCOSE, CAPILLARY
Glucose-Capillary: 126 mg/dL — ABNORMAL HIGH (ref 70–99)
Glucose-Capillary: 129 mg/dL — ABNORMAL HIGH (ref 70–99)
Glucose-Capillary: 166 mg/dL — ABNORMAL HIGH (ref 70–99)
Glucose-Capillary: 152 mg/dL — ABNORMAL HIGH (ref 70–99)
Glucose-Capillary: 163 mg/dL — ABNORMAL HIGH (ref 70–99)

## 2020-10-13 LAB — COMPREHENSIVE METABOLIC PANEL
ALT: 10 U/L (ref 0–44)
AST: 14 U/L — ABNORMAL LOW (ref 15–41)
Albumin: 2.2 g/dL — ABNORMAL LOW (ref 3.5–5.0)
Alkaline Phosphatase: 99 U/L (ref 38–126)
Anion gap: 5 (ref 5–15)
BUN: 27 mg/dL — ABNORMAL HIGH (ref 8–23)
CO2: 37 mmol/L — ABNORMAL HIGH (ref 22–32)
Calcium: 8.9 mg/dL (ref 8.9–10.3)
Chloride: 93 mmol/L — ABNORMAL LOW (ref 98–111)
Creatinine, Ser: 1.01 mg/dL (ref 0.61–1.24)
GFR, Estimated: >60 mL/min (ref >60)
Glucose, Bld: 131 mg/dL — ABNORMAL HIGH (ref 70–99)
Potassium: 4.6 mmol/L (ref 3.5–5.1)
Sodium: 135 mmol/L (ref 135–145)
Total Bilirubin: 0.1 mg/dL — ABNORMAL LOW (ref 0.3–1.2)
Total Protein: 5.3 g/dL — ABNORMAL LOW (ref 6.5–8.1)

## 2020-10-13 MED ORDER — FREE WATER
150.0000 mL | Status: DC
  Administered 2020-10-13 – 2020-10-16 (×18): 150 mL

## 2020-10-13 MED ORDER — BACID PO TABS
2.0000 | ORAL_TABLET | Freq: Three times a day (TID) | ORAL | Status: DC
  Filled 2020-10-13: qty 2

## 2020-10-13 MED ORDER — GLUCERNA 1.5 CAL PO LIQD
1000.0000 mL | ORAL | Status: DC
  Administered 2020-10-13 – 2020-10-19 (×6): 1000 mL
  Filled 2020-10-13 (×13): qty 1000

## 2020-10-13 MED ORDER — SACCHAROMYCES BOULARDII 250 MG PO CAPS
250.0000 mg | ORAL_CAPSULE | Freq: Two times a day (BID) | ORAL | Status: DC
  Administered 2020-10-13 – 2020-10-30 (×35): 250 mg
  Filled 2020-10-13 (×35): qty 1

## 2020-10-13 NOTE — Evaluation (Signed)
Physical Therapy Assessment and Plan  Patient Details  Name: Devin [name] MRN: [id number] Date of Birth: June 02, 1941  PT Diagnosis: Difficulty walking and Muscle weakness Rehab Potential: Good ELOS: 3 weeks   Today's Date: 10/13/2020 PT Individual Time: 0915-1000 PT Individual Time Calculation (min): 45 min  and Today's Date: 10/13/2020 PT Missed Time: 30 Minutes Missed Time Reason: Nursing care   Hospital Problem: Principal Problem:   Debility   Past Medical History:  Past Medical History:  Diagnosis Date  . A-fib ([place])   . 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 Ankylosing spondylitis ([place])    Diagnosed during lumbar fracture summer of 2016    . Crohn's disease ([place])   . Gout   . HIstory of basal cell cancer of face    THYROID CA HX  . History of kidney stones   . Hypertension   . Hypothyroidism   . Impotence   . 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 Obesity (BMI 30-39.9)   . Osteoporosis    Pt completed 5 years of fosamax in 2013     . Prostate cancer with recurrence    Treated with prostatectomy with recurrence 2012 with Lupron treatment now him   . Psoriasis   . Thyroid cancer ([place]) 1999   Treated with RAI and total thyroidectomy   . Type II diabetes mellitus ([place])    Past Surgical History:  Past Surgical History:  Procedure Laterality Date  . ABDOMINAL EXPLORATION SURGERY     for small bowel obstruction  . APPENDECTOMY  03/2007  . BACK SURGERY    . BASAL CELL CARCINOMA EXCISION  "several"   "head"  . CARDIAC CATHETERIZATION  03/17/2003  . CHOLECYSTECTOMY N/A 05/07/2016   Procedure: LAPAROSCOPIC CHOLECYSTECTOMY;  Surgeon: Fanny Skates, MD;  Location: [facility];  Service: General;  Laterality: N/A;  . COLON SURGERY  03/2007   Resection of cecum, appendix, terminal ileum (approximately/notes 10/10/2010  . CYSTOSCOPY/URETEROSCOPY/HOLMIUM LASER/STENT PLACEMENT Right 07/03/2020   Procedure:  CYSTOSCOPY/RETROGRADE/URETEROSCOPY REMOVAL OF BLADDER STONE;  Surgeon: Raynelle Bring, MD;  Location: [facility];  Service: Urology;  Laterality: Right;  . DIRECT LARYNGOSCOPY N/A 10/04/2020   Procedure: DIRECT LARYNGOSCOPY, PHARYNGOSCOPY WITH BIOPSY;  Surgeon: [name], DO;  Location: [facility];  Service: ENT;  Laterality: N/A;  . ESOPHAGOSCOPY N/A 10/04/2020   Procedure: ESOPHAGOSCOPY WITH BIOPSY;  Surgeon: [name], DO;  Location: [facility];  Service: ENT;  Laterality: N/A;  . HERNIA REPAIR    . LAMINECTOMY WITH POSTERIOR LATERAL ARTHRODESIS LEVEL 1 N/A 09/15/2020   Procedure: REVISION OF THORACOLUMBAR FUSION, ADDITION OF CROSS-CONNECTORS;  Surgeon: Eustace Moore, MD;  Location: [facility];  Service: Neurosurgery;  Laterality: N/A;  . LAPAROSCOPIC CHOLECYSTECTOMY  05/07/2016  . LAPAROSCOPIC LYSIS OF ADHESIONS  05/07/2016  . LAPAROSCOPIC LYSIS OF ADHESIONS N/A 05/07/2016   Procedure: LAPAROSCOPIC LYSIS OF ADHESIONS TIMES ONE HOUR;  Surgeon: Fanny Skates, MD;  Location: [facility];  Service: General;  Laterality: N/A;  . POSTERIOR FUSION THORACIC SPINE  02/08/2016   1. Posterior thoracic arthrodesis T7-T11 utilizing morcellized allograft, 2. Posterior thoracic segmental fixation T7-T11 utilizing nuvasive pedicle screws  . PROSTATECTOMY  06/2001   w/bilateral pelvic lymph nose dissection/notes 10/24/2010  . SPINAL FUSION  12/2014   Open reduction internal fixation of L1 Chance fracture with posterior fusion T10-L4 utilizing morcellized allograft and some local autograft, segmental instrumentation T10-L4 inclusive utilizing nuvasive pedicle screws/notes 12/16/2014  . Stress Cardiolite  02/17/2003  .  THOROCOTOMY WITH LOBECTOMY  03/16/2015   Procedure: THOROCOTOMY WITH LOBECTOMY;  Surgeon: Ivin Poot, MD;  Location: [facility];  Service: Thoracic;;  . TONSILLECTOMY    . TOTAL THYROIDECTOMY  1997  . Venous Doppler  05/30/2004  . VENTRAL HERNIA REPAIR  04/14/2008  . VIDEO ASSISTED THORACOSCOPY Left  03/16/2015   Procedure: VIDEO ASSISTED THORACOSCOPY;  Surgeon: Ivin Poot, MD;  Location: [facility];  Service: Thoracic;  Laterality: Left;    Assessment & Plan Clinical Impression: Patient is a 80 year old male with history of HTN, T2DM,  Crohn's disease, A fib, Lung cancer s/p LUL lobectomy, restrictive lung disease, ankylosing spondylosis who sustained a fall on 09/07/2020 with progressive pain.  He was admitted on 09/10/2020 with acute on chronic hypercarbic respiratory failure, T11 Chance fracture and agitation.  He underwent posterior thoracic fusion T11-T12 on 04/08 by Dr. Ronnald Ramp and had difficulty tolerating extubation.  He ultimately required tracheostomy on 04/12 and continued to have issues with copious secretion via trach, developed Kleb pneumonia and was treated with course of ceftriaxone.  He also had issues with significant diarrhea due to inability to take Pentasa through his core track and n.p.o. status.  Dr. Hilarie Fredrickson  was consulted for input and recommended managing diarrhea with rectal tube as well as Lomotil with probiotics until able to tolerate p.o.'s.  He was extubated to ATC and was requiring 8 to 10 L via ATC as well as frequent suctioning due to copious secretions.  He had issues with hyperkalemia as well as AKI and nephrology recommended changing of tube feeds to nephro and he was treated with Lokelma, Kayexalate, IV lasix, insulin as well as sodium bicarb with improvement. He underwent biopsy of supraglottic mass (seen on MBS) by Dr. Lestine Mount negative for malignancy. He was admitted to CIR on 04/29 for comprehensive inpatient rehab and was found to have urinary retention requiring in and out catheterization.  On a.m. of 04/30, patient had pulled out his feeding tube and NG tube was extremely difficult to place and tube feeds were held with subsequent hypoglycemic episodes.  Chest x-ray done showing concerns of aspiration pneumonia with bilateral pleural effusions and he  was started on broad-spectrum antibiotic.  He did develop encephalopathy with worsening of hypoxia, concerns of sepsis and was transferred to acute floor for management.  Coretak  was replaced with resumption of tube feeds and water flushes increased to help manage AKI.   PCCM consulted on 05/03 due to difficulty to inability to pass suction via trach sue to obstruction. He underwent bronchoscopy with placement of XLT shiley by Marni Griffon NP. CT neck done revealing improvement in retropharyngeal fullness on right, negative for subglottic stenosis and moderately large right pleural effusion. PCCM felt that no intervention needed for effusion as patient without symptoms and to keep XLT in place for 2 weeks to allow for irritation to resolve and have ENT re-evaluate for input and to decannulate in OR for inspection of entire airway. As procalcitonin trending down with resolution of encephalopathy, antibiotics narrowed to Zosyn.  Tube feeds changed back to Glucerna by dietitian due to concerns of severe malnutrition. Therapy resumed and patient continues to be limited by generalized weakness, BLE weakness with back pain, decreased activity tolerance and balance deficits.  Patient transferred to CIR on 10/12/2020 .   Patient currently requires mod with mobility secondary to muscle weakness, decreased cardiorespiratoy endurance and decreased sitting balance, decreased standing balance, decreased postural control and decreased balance strategies.  Prior to  hospitalization, patient was independent  with mobility and lived with Spouse in a House home.  Home access is 3Stairs to enter.  Patient will benefit from skilled PT intervention to maximize safe functional mobility, minimize fall risk and decrease caregiver burden for planned discharge home with 24 hour supervision.  Anticipate patient will benefit from follow up [place] at discharge.  PT - End of Session Activity Tolerance: Tolerates 10 - 20 min activity with  multiple rests Endurance Deficit: Yes Endurance Deficit Description: Requires multipe rest breaks during mobility tasks PT Assessment Rehab Potential (ACUTE/IP ONLY): Good PT Barriers to Discharge: Decreased caregiver support;Home environment access/layout;Trach;Incontinence PT Patient demonstrates impairments in the following area(s): Balance;Endurance;Motor;Pain;Perception;Safety PT Transfers Functional Problem(s): Bed Mobility;Bed to Chair;Car;Furniture PT Locomotion Functional Problem(s): Ambulation;Stairs;Wheelchair Mobility PT Plan PT Intensity: Minimum of 1-2 x/day ,45 to 90 minutes PT Frequency: 5 out of 7 days PT Duration Estimated Length of Stay: 3 weeks PT Treatment/Interventions: DME/adaptive equipment instruction;Ambulation/gait training;Community reintegration;Neuromuscular re-education;Psychosocial support;Stair training;UE/LE Strength taining/ROM;Wheelchair propulsion/positioning;Balance/vestibular training;Discharge planning;Functional electrical stimulation;Pain management;Skin care/wound management;Therapeutic Activities;UE/LE Coordination activities;Cognitive remediation/compensation;Disease management/prevention;Functional mobility training;Patient/family education;Splinting/orthotics;Therapeutic Exercise;Visual/perceptual remediation/compensation PT Transfers Anticipated Outcome(s): Supervision PT Locomotion Anticipated Outcome(s): Supervision PT Recommendation Follow Up Recommendations: Home health PT;24 hour supervision/assistance Patient destination: Home Equipment Recommended: To be determined   PT Evaluation Precautions/Restrictions Precautions Precautions: Fall;Back Precaution Comments: rectal tube, trach collar, cortrak, TLSO Required Braces or Orthoses: Spinal Brace Spinal Brace: Thoracolumbosacral orthotic;Applied in sitting position Restrictions Weight Bearing Restrictions: No General Chart Reviewed: Yes PT Amount of Missed Time (min): 30 Minutes PT  Missed Treatment Reason: Nursing care Family/Caregiver Present: No  Home Living/Prior Functioning Home Living Available Help at Discharge: Family;Available 24 hours/day Type of Home: House Home Access: Stairs to enter [employer] of Steps: 3 Entrance Stairs-Rails: Left  Lives With: Spouse Prior Function Level of Independence: Independent with basic ADLs;Independent with transfers;Independent with homemaking with ambulation;Independent with gait  Able to Take Stairs?: Yes Vision/Perception  Perception Perception: Within Functional Limits Praxis Praxis: Intact  Cognition Overall Cognitive Status: Within Functional Limits for tasks assessed Arousal/Alertness: Awake/alert Orientation Level: Oriented X4 Safety/Judgment: Appears intact Sensation Sensation Light Touch: Appears Intact Coordination Gross Motor Movements are Fluid and Coordinated: No Fine Motor Movements are Fluid and Coordinated: No Coordination and Movement Description: Limited by generalized weakness Motor  Motor Motor: Within Functional Limits   Trunk/Postural Assessment  Cervical Assessment Cervical Assessment:  (forward head) Thoracic Assessment Thoracic Assessment:  (rounded shoulders) Lumbar Assessment Lumbar Assessment:  (posterior pelvic tilt) Postural Control Postural Control: Deficits on evaluation Righting Reactions: Delayed  Balance Balance Balance Assessed: Yes Static Sitting Balance Static Sitting - Balance Support: Feet supported;Bilateral upper extremity supported Static Sitting - Level of Assistance: 5: Stand by assistance Dynamic Sitting Balance Dynamic Sitting - Balance Support: Bilateral upper extremity supported;Feet supported Dynamic Sitting - Level of Assistance: 4: Min Insurance risk surveyor Standing - Balance Support: Bilateral upper extremity supported;During functional activity Static Standing - Level of Assistance: 3: Mod assist Dynamic Standing  Balance Dynamic Standing - Balance Support: Bilateral upper extremity supported;During functional activity Dynamic Standing - Level of Assistance: 3: Mod assist Extremity Assessment  RLE Assessment Passive Range of Motion (PROM) Comments: Lacks full knee extension due soft tissue restrictions General Strength Comments: Grossly 3/5 LLE Assessment Passive Range of Motion (PROM) Comments: Lacks full knee extension due soft tissue restrictions General Strength Comments: Grossly 3/5  Care Tool Care Tool Bed Mobility Roll left and right activity   Roll left and right assist level: Moderate Assistance -  Patient 50 - 74%    Sit to lying activity   Sit to lying assist level: Moderate Assistance - Patient 50 - 74%    Lying to sitting edge of bed activity   Lying to sitting edge of bed assist level: Moderate Assistance - Patient 50 - 74%     Care Tool Transfers Sit to stand transfer   Sit to stand assist level: Moderate Assistance - Patient 50 - 74%    Chair/bed transfer Chair/bed transfer activity did not occur: Safety/medical concerns       Toilet transfer Toilet transfer activity did not occur: Safety/medical concerns      Scientist, product/process development transfer activity did not occur: Safety/medical concerns        Care Tool Locomotion Ambulation   Assist level: Moderate Assistance - Patient 50 - 74% Assistive device: Hand held assist Max distance: 2'  Walk 10 feet activity Walk 10 feet activity did not occur: Safety/medical concerns       Walk 50 feet with 2 turns activity Walk 50 feet with 2 turns activity did not occur: Safety/medical concerns      Walk 150 feet activity Walk 150 feet activity did not occur: Safety/medical concerns      Walk 10 feet on uneven surfaces activity Walk 10 feet on uneven surfaces activity did not occur: Safety/medical concerns      Stairs Stair activity did not occur: Safety/medical concerns        Walk up/down 1 step activity Walk up/down 1 step  or curb (drop down) activity did not occur: Safety/medical concerns     Walk up/down 4 steps activity did not occuR: Safety/medical concerns  Walk up/down 4 steps activity      Walk up/down 12 steps activity Walk up/down 12 steps activity did not occur: Safety/medical concerns      Pick up small objects from floor Pick up small object from the floor (from standing position) activity did not occur: Safety/medical concerns      Wheelchair Will patient use wheelchair at discharge?: No          Wheel 50 feet with 2 turns activity      Wheel 150 feet activity        Refer to Care Plan for Long Term Goals  SHORT TERM GOAL WEEK 1 PT Short Term Goal 1 (Week 1): Pt will complete bed mobility consistently with minA. PT Short Term Goal 2 (Week 1): Pt will perform bed to chair transfer consistently with minA. PT Short Term Goal 3 (Week 1): Pt will ambulate x25' with minA and LRAD.  Recommendations for other services: None   Skilled Therapeutic Intervention  Pt misses 30 minutes of skilled PT due to RN staff in room performing in-an-out cath. Evaluation completed (see details above and below) with education on PT POC and goals and individual treatment initiated with focus on bed mobility, balance, transfers, and initiation of steps. Pt received supine in bed and agrees to therapy. Reports pain in back and buttocks when moving. PT provides repositioning and rest breaks to manage pain. Pt performs supine to sit with increased time, modA, and cues for logrolling technique. Pt sits at EOB for several minutes and reports gradually increasing dizziness. Pt returns to supine with modA. Blood pressure assessed in supine at 134/64. Following several minute rest break, pt returns to sitting with modA. Reports some dizziness but not as bad. PT attempts to take BP but vitals machine unable to accurately assess. Pt performs sit  to stand with modA and pt holding onto therapist's shoulders. Pt takes several  sidesteps toward the [facility] to the L. X1 additional rep of sit to stand with modA. Pt reports significant fatigue and requests to return to supine. ModA for sit to supine. Left with alarm intact and all needs within reach.  Mobility Bed Mobility Bed Mobility: Supine to Sit;Sit to Supine Supine to Sit: Moderate Assistance - Patient 50-74% Sit to Supine: Moderate Assistance - Patient 50-74% Transfers Transfers: Stand to Sit;Sit to Stand Sit to Stand: Moderate Assistance - Patient 50-74% Stand to Sit: Moderate Assistance - Patient 50-74% Locomotion  Gait Ambulation: Yes Gait Assistance: Moderate Assistance - Patient 50-74% Gait Distance (Feet): 2 Feet Assistive device: 1 person hand held assist Gait Assistance Details: Verbal cues for technique;Verbal cues for precautions/safety;Tactile cues for posture;Tactile cues for weight shifting;Tactile cues for initiation Gait Gait: Yes Gait Pattern:  (Pt only able to side step at EOB) Stairs / Additional Locomotion Stairs: No   Discharge Criteria: Patient will be discharged from PT if patient refuses treatment 3 consecutive times without medical reason, if treatment goals not met, if there is a change in medical status, if patient makes no progress towards goals or if patient is discharged from hospital.  The above assessment, treatment plan, treatment alternatives and goals were discussed and mutually agreed upon: by patient  Breck Coons, PT, DPT 10/13/2020, 3:59 PM

## 2020-10-13 NOTE — Progress Notes (Signed)
Inpatient Rehabilitation  Patient information reviewed and entered into eRehab system by Izella Ybanez M. Pearlean Sabina, M.A., CCC/SLP, PPS Coordinator.  Information including medical coding, functional ability and quality indicators will be reviewed and updated through discharge.    

## 2020-10-13 NOTE — Evaluation (Signed)
Speech Language Pathology Assessment and Plan  Patient Details  Name: Devin [name] MRN: [id number] Date of Birth: December 19, 1940  SLP Diagnosis: Dysphagia;Voice disorder  Rehab Potential: Fair ELOS: 3 weeks    Today's Date: 10/13/2020 SLP Individual Time: 1275-1700 SLP Individual Time Calculation (min): 55 min   Hospital Problem: Principal Problem:   Debility  Past Medical History:  Past Medical History:  Diagnosis Date  . A-fib ([place])   . Adenocarcinoma of left lung, stage 1 ([place]) 03/10/2015  . Ankylosing spondylitis ([place])    Diagnosed during lumbar fracture summer of 2016    . Crohn's disease ([place])   . Gout   . HIstory of basal cell cancer of face    THYROID CA HX  . History of kidney stones   . Hypertension   . Hypothyroidism   . Impotence   . 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 Obesity (BMI 30-39.9)   . Osteoporosis    Pt completed 5 years of fosamax in 2013     . Prostate cancer with recurrence    Treated with prostatectomy with recurrence 2012 with Lupron treatment now him   . Psoriasis   . Thyroid cancer ([place]) 1999   Treated with RAI and total thyroidectomy   . Type II diabetes mellitus ([place])    Past Surgical History:  Past Surgical History:  Procedure Laterality Date  . ABDOMINAL EXPLORATION SURGERY     for small bowel obstruction  . APPENDECTOMY  03/2007  . BACK SURGERY    . BASAL CELL CARCINOMA EXCISION  "several"   "head"  . CARDIAC CATHETERIZATION  03/17/2003  . CHOLECYSTECTOMY N/A 05/07/2016   Procedure: LAPAROSCOPIC CHOLECYSTECTOMY;  Surgeon: Fanny Skates, MD;  Location: [facility];  Service: General;  Laterality: N/A;  . COLON SURGERY  03/2007   Resection of cecum, appendix, terminal ileum (approximately/notes 10/10/2010  . CYSTOSCOPY/URETEROSCOPY/HOLMIUM LASER/STENT PLACEMENT Right 07/03/2020   Procedure: CYSTOSCOPY/RETROGRADE/URETEROSCOPY REMOVAL OF BLADDER STONE;  Surgeon: Raynelle Bring, MD;  Location: [facility];  Service:  Urology;  Laterality: Right;  . DIRECT LARYNGOSCOPY N/A 10/04/2020   Procedure: DIRECT LARYNGOSCOPY, PHARYNGOSCOPY WITH BIOPSY;  Surgeon: [name], DO;  Location: [facility];  Service: ENT;  Laterality: N/A;  . ESOPHAGOSCOPY N/A 10/04/2020   Procedure: ESOPHAGOSCOPY WITH BIOPSY;  Surgeon: [name], DO;  Location: [facility];  Service: ENT;  Laterality: N/A;  . HERNIA REPAIR    . LAMINECTOMY WITH POSTERIOR LATERAL ARTHRODESIS LEVEL 1 N/A 09/15/2020   Procedure: REVISION OF THORACOLUMBAR FUSION, ADDITION OF CROSS-CONNECTORS;  Surgeon: Eustace Moore, MD;  Location: [facility];  Service: Neurosurgery;  Laterality: N/A;  . LAPAROSCOPIC CHOLECYSTECTOMY  05/07/2016  . LAPAROSCOPIC LYSIS OF ADHESIONS  05/07/2016  . LAPAROSCOPIC LYSIS OF ADHESIONS N/A 05/07/2016   Procedure: LAPAROSCOPIC LYSIS OF ADHESIONS TIMES ONE HOUR;  Surgeon: Fanny Skates, MD;  Location: [facility];  Service: General;  Laterality: N/A;  . POSTERIOR FUSION THORACIC SPINE  02/08/2016   1. Posterior thoracic arthrodesis T7-T11 utilizing morcellized allograft, 2. Posterior thoracic segmental fixation T7-T11 utilizing nuvasive pedicle screws  . PROSTATECTOMY  06/2001   w/bilateral pelvic lymph nose dissection/notes 10/24/2010  . SPINAL FUSION  12/2014   Open reduction internal fixation of L1 Chance fracture with posterior fusion T10-L4 utilizing morcellized allograft and some local autograft, segmental instrumentation T10-L4 inclusive utilizing nuvasive pedicle screws/notes 12/16/2014  . Stress Cardiolite  02/17/2003  . THOROCOTOMY WITH LOBECTOMY  03/16/2015   Procedure: THOROCOTOMY WITH LOBECTOMY;  Surgeon: Tharon Aquas Trigt,  MD;  Location: [facility];  Service: Thoracic;;  . TONSILLECTOMY    . TOTAL THYROIDECTOMY  1997  . Venous Doppler  05/30/2004  . VENTRAL HERNIA REPAIR  04/14/2008  . VIDEO ASSISTED THORACOSCOPY Left 03/16/2015   Procedure: VIDEO ASSISTED THORACOSCOPY;  Surgeon: Ivin Poot, MD;  [facility];  Service: Thoracic;   Laterality: Left;    Assessment / Plan / Recommendation Clinical Impression Patient is a 80 year old male with history of HTN, T2DM,  Crohn's disease, A fib, Lung cancer s/p LUL lobectomy, restrictive lung disease, ankylosing spondylosis who sustained a fall on 09/07/2020 with progressive pain.  He was admitted on 09/10/2020 with acute on chronic hypercarbic respiratory failure, T11 Chance fracture and agitation.  He underwent posterior thoracic fusion T11-T12 on 04/08 by Dr. Ronnald Ramp and had difficulty tolerating extubation.  He ultimately required tracheostomy on 04/12 and continued to have issues with copious secretion via trach, developed Kleb pneumonia and was treated with course of ceftriaxone.  He also had issues with significant diarrhea with rectal tube  He was extubated to ATC and was requiring 8 to 10 L via ATC as well as frequent suctioning due to copious secretions.  He underwent biopsy of supraglottic mass (seen on MBS) by Dr. Lestine Mount negative for malignancy. He was admitted to CIR on 04/29 for comprehensive inpatient rehab and was found to have urinary retention requiring in and out catheterization.  On a.m. of 04/30, patient had pulled out his feeding tube and NG tube was extremely difficult to place and tube feeds were held with subsequent hypoglycemic episodes.  Chest x-ray done showing concerns of aspiration pneumonia with bilateral pleural effusions and he was started on broad-spectrum antibiotic.  He did develop encephalopathy with worsening of hypoxia, concerns of sepsis and was transferred to acute floor for management.  PCCM consulted on 05/03 due to difficulty to inability to pass suction via trach sue to obstruction. He underwent bronchoscopy with placement of XLT shiley by Marni Griffon NP. CT neck done revealing improvement in retropharyngeal fullness on right, negative for subglottic stenosis and moderately large right pleural effusion. PCCM felt that no intervention  needed for effusion as patient without symptoms and to keep XLT in place for 2 weeks to allow for irritation to resolve and have ENT re-evaluate for input and to decannulate in OR for inspection of entire airway. Therapy resumed and patient continues to be limited by generalized weakness, BLE weakness with back pain, decreased activity tolerance and balance deficits. CIR recommended due to functional decline. Patient re-admitted 10/12/20.  Patient currently has a #6 XL cuffless trach in place. PMSV was donned and remained in place for ~60 minutes with all vitals remaining WFL.  Patient's vocal quality was hoarse with a low vocal intensity which impacted his overall intelligibility. Patient required cueing for increased breath support and use of verbal expression vs gestures for communication. Suspect function was also impacted by fatigue. Recommend patient wear PMSV during all waking hours with full supervision. Patient consumed trials of ice chips (2) after oral care via the suction toothbrush. Patient demonstrated active mastication and utilized multiple, effortful swallows. Patient with throat clearing and coughing after each trial and able to orally expectorate suspected residuals per results from recent MBS. Recommend patient remain NPO and allow ice chips intermittently for oral comfort with full supervision from staff and PMSV in place. A formal cognitive evaluation was not administered but patient noted to demonstrate deficits in attention, initiation, and awareness. Patient would benefit  from skilled SLP intervention to maximize his functional communication, recall and adherence to swallowing precautions with trials of ice chips to assess possible readiness for repeat MBS and administration of a cognitive assessment.    Skilled Therapeutic Interventions          Administered a PMSV evaluation and BSE, please see above for details.   SLP Assessment  Patient will need skilled [place] Pathology  Services during CIR admission    Recommendations  Patient may use Passy-Muir Speech Valve: During all waking hours (remove during sleep) PMSV Supervision: Full SLP Diet Recommendations: NPO;Alternative means - temporary Medication Administration: Via alternative means Oral Care Recommendations: Oral care QID Recommendations for Other Services: Neuropsych consult Patient destination: Home Follow up Recommendations: 24 hour supervision/assistance;Home Health SLP Equipment Recommended: To be determined    SLP Frequency 3 to 5 out of 7 days   SLP Duration  SLP Intensity  SLP Treatment/Interventions 3 weeks  Minumum of 1-2 x/day, 30 to 90 minutes  Dysphagia/aspiration precaution training;Speech/Language facilitation;Cueing hierarchy;Environmental controls;Therapeutic Activities;Patient/family education;Functional tasks    Pain No/Denies Pain  SLP Evaluation Oral Motor Oral Motor/Sensory Function Overall Oral Motor/Sensory Function: Within functional limits Motor Speech Overall Motor Speech: Impaired Respiration: Impaired Level of Impairment: Word Phonation: Low vocal intensity;Hoarse Articulation: Within functional limitis Intelligibility: Intelligibility reduced Word: 75-100% accurate Phrase: 75-100% accurate Sentence: 75-100% accurate Conversation: Not tested Motor Planning: Witnin functional limits Effective Techniques: Increased vocal intensity  Care Tool Care Tool Cognition Expression of Ideas and Wants Expression of Ideas and Wants: Frequent difficulty - frequently exhibits difficulty with expressing needs and ideas   Understanding Verbal and Non-Verbal Content Understanding Verbal and Non-Verbal Content: Sometimes understands - understands only basic conversations or simple, direct phrases. Frequently requires cues to understand   Memory/Recall Ability *first 3 days only Memory/Recall Ability *first 3 days only: That he or she is in a hospital/hospital unit      PMSV Assessment  PMSV Trial PMSV was placed for: 60 minutes Able to redirect subglottic air through upper airway: Yes Able to Attain Phonation: Yes Voice Quality: Low vocal intensity;Hoarse Able to Expectorate Secretions: Yes Level of Secretion Expectoration with PMSV: Oral Breath Support for Phonation: Mildly decreased Intelligibility: Intelligibility reduced Word: 75-100% accurate Phrase: 75-100% accurate Sentence: 75-100% accurate Conversation: Not tested SpO2 During Trial: 96 % Pulse During Trial: 70 Behavior: Cooperative;Lethargic;Controlled  Bedside Swallowing Assessment General Date of Onset: 09/07/20 Previous Swallow Assessment: MBS on 4/22: Recommended NPO Diet Prior to this Study: NPO;NG Tube Temperature Spikes Noted: No Respiratory Status: Trach Trach Size and Type: #6;With PMSV in place;Extra long;Uncuffed History of Recent Intubation: Yes Length of Intubations (days): 9 days Date extubated: 09/19/20 Behavior/Cognition: Requires cueing;Lethargic/Drowsy;Distractible Oral Cavity - Dentition: Missing dentition Self-Feeding Abilities: Able to feed self;Needs assist Patient Positioning: Upright in bed Baseline Vocal Quality: Low vocal intensity;Hoarse Volitional Cough: Strong Volitional Swallow: Able to elicit  Ice Chips Ice chips: Impaired Presentation: Spoon Pharyngeal Phase Impairments: Cough - Delayed;Throat Clearing - Immediate;Multiple swallows Other Comments: expectorated suspected pharyngeal residue based on results from last MBS Thin Liquid Thin Liquid: Not tested Nectar Thick Nectar Thick Liquid: Not tested Honey Thick Honey Thick Liquid: Not tested Puree Puree: Not tested Solid Solid: Not tested BSE Assessment Risk for Aspiration Impact on safety and function: Severe aspiration risk Other Related Risk Factors: Tracheostomy;Deconditioning;Lethargy;Cognitive impairment;Prolonged intubation;Decreased respiratory status  Short Term  Goals: Week 1: SLP Short Term Goal 1 (Week 1): Patient will initiate verbal responses vs use of gestures for communication with PMSV in  place in 50% of opportunities with Mod verbal cues. SLP Short Term Goal 2 (Week 1): Patient will utilize an increased vocal intensity at the phrase level to achieve ~80% intelligibility with Mod A verbal cues. SLP Short Term Goal 3 (Week 1): Patient will recall swallowing and safety precautions for trials of ice chips wth Mod A multimodal cues.  Refer to Care Plan for Long Term Goals  Recommendations for other services: Neuropsych  Discharge Criteria: Patient will be discharged from SLP if patient refuses treatment 3 consecutive times without medical reason, if treatment goals not met, if there is a change in medical status, if patient makes no progress towards goals or if patient is discharged from hospital.  The above assessment, treatment plan, treatment alternatives and goals were discussed and mutually agreed upon: by patient and by family  Sarahgrace Broman 10/13/2020, 12:14 PM

## 2020-10-13 NOTE — Progress Notes (Signed)
PROGRESS NOTE   Subjective/Complaints: Pt in bed. No problems reported overnight by nursing. Pt appears comfortable  ROS: Limited due to cognitive/behavioral    Objective:   No results found. Recent Labs    10/12/20 0249 10/13/20 0514  WBC 16.4* 13.5*  HGB 8.8* 8.4*  HCT 29.3* 28.1*  PLT 449* 361   Recent Labs    10/12/20 0249 10/13/20 0514  NA 135 135  K 4.4 4.6  CL 96* 93*  CO2 33* 37*  GLUCOSE 121* 131*  BUN 31* 27*  CREATININE 0.90 1.01  CALCIUM 8.7* 8.9    Intake/Output Summary (Last 24 hours) at 10/13/2020 1129 Last data filed at 10/13/2020 6734 Gross per 24 hour  Intake --  Output 5200 ml  Net -5200 ml     Pressure Injury 09/12/20 Back Mid;Lower Stage 1 -  Intact skin with non-blanchable redness of a localized area usually over a bony prominence. Blister-like skin breakdown from patients TSLO brace (Active)  09/12/20 2000  Location: Back  Location Orientation: Mid;Lower  Staging: Stage 1 -  Intact skin with non-blanchable redness of a localized area usually over a bony prominence.  Wound Description (Comments): Blister-like skin breakdown from patients TSLO brace  Present on Admission: Yes    Physical Exam: Vital Signs Blood pressure (!) 130/56, pulse 71, temperature 97.8 F (36.6 C), temperature source Oral, resp. rate 18, weight 89.2 kg, SpO2 98 %.  General: pt lying in bed. No apparent distress HEENT: Head is normocephalic, atraumatic, PERRLA, EOMI, sclera anicteric, oral mucosa pink and moist, dentition intact, ext ear canals clear. NGT in place Neck: Supple without JVD or lymphadenopathy, #6 trach open to air, mild secretions Heart: Reg rate and rhythm. No murmurs rubs or gallops Chest: no distress. A few scattered rhonchi, crackles at bases Abdomen/GI: Soft, non-tender, non-distended, bowel sounds positive. Rectal tube Extremities: No clubbing, cyanosis, or edema. Pulses are 2+ Psych: pt  calm. A little slow to initiate Skin: Clean and intact without signs of breakdown. Scattered bruises on UE's Neuro: awoke easily but somewhat somnolent. Followed commands. Able to cough around trach. No focal CN abnl. Reflexes are 1+ in all 4's. Moves all 4's. UE grossly 3/5 prox to distal. LE: 2/5 HF, KE and 3/5 ADF/APF. Musculoskeletal: no pain with PROM of extremities    Assessment/Plan: 1. Functional deficits which require 3+ hours per day of interdisciplinary therapy in a comprehensive inpatient rehab setting.  Physiatrist is providing close team supervision and 24 hour management of active medical problems listed below.  Physiatrist and rehab team continue to assess barriers to discharge/monitor patient progress toward functional and medical goals  Care Tool:  Bathing              Bathing assist       Upper Body Dressing/Undressing Upper body dressing   What is the patient wearing?: Hospital gown only    Upper body assist Assist Level: Maximal Assistance - Patient 25 - 49%    Lower Body Dressing/Undressing Lower body dressing      What is the patient wearing?: Incontinence brief     Lower body assist Assist for lower body dressing: 2 Helpers  Toileting Toileting    Toileting assist Assist for toileting: 2 Helpers     Transfers Chair/bed transfer  Transfers assist     Chair/bed transfer assist level: Maximal Assistance - Patient 25 - 49%     Locomotion Ambulation   Ambulation assist              Walk 10 feet activity   Assist           Walk 50 feet activity   Assist           Walk 150 feet activity   Assist           Walk 10 feet on uneven surface  activity   Assist           Wheelchair     Assist               Wheelchair 50 feet with 2 turns activity    Assist            Wheelchair 150 feet activity     Assist          Blood pressure (!) 130/56, pulse 71, temperature  97.8 F (36.6 C), temperature source Oral, resp. rate 18, weight 89.2 kg, SpO2 98 %.  Medical Problem List and Plan: 1.  T11/12 compression fx s/p posterior fusion secondary to fall with debility due to asp PNA/trach/generalzied weakness             -patient may not shower             -ELOS/Goals: 2-3 weeks- min A  -Patient is beginning CIR therapies today including PT and OT , SLP 2.  Antithrombotics: -DVT/anticoagulation:  Pharmaceutical: Other (comment)--Apixaban bid.              -antiplatelet therapy: N/A 3. Pain Management:  hydrocodone prn for severe pain  - Tramadol prn for moderate pain.   4. Mood/anxiety/sleep: LCSW to follow for evaluation and support.              -antipsychotic agents: N/A  -xanax bid prn anxiety  -trazodone for sleep prn  -monitor behavior 5. Neuropsych: This patient is not fully capable of making decisions on his own behalf. 6. Skin/Wound Care: Pressure relief measures.  7. Fluids/Electrolytes/Nutrition: NPO with tube feeds for nutritional support.             --dietician to assist with input to maintain nutritional status.              --BUN/Cr holding,(27/1) fluid boluses were decreased  -follow up labs monday 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               --PCCM to follow for change and input on scope/decannulation in the future. Also wants ENT to help with possible tracheal stenosis?             --continue to suction frequently with chest PT to help mobilize secretions.   -vest to help with secretions 9. Acute on chronic respiratory failure: Encourage pulmonary hygiene              --continue Pulmicort nebs bid.  10.  PAF:  Monitor HR tid--continue Atenolol bid with Cardizem tid.   -HR controlled 5/6 10. Hyponatremia: Has resolved on today's labs. Continue to monitor.  11. Acute blood loss anemia, anemia chronic disease: Monitor with serial checks.   -hgb 8.4 5/6 12.  Leukocytosis: WBC continues to fluctuate.   -  no new signs of infection  -5/6 wbc 13.5. 52. Crohn's disease: Continue rectal tube with probiotic and imodium.  14.  Bilateral pleural effusion: Pulmonary hygiene w/ flutter valve.              --continue to monitor for symptoms with increase in activity.   15. Aspiration PNA: zosyn completed 5/5 16. Dysphagia: Continue NPO with Glucerna @ 55 cc/hr             --water flushes 170 cc q4.  17. T2DM: Was on Humalog tid prior to admission.             --will monitor BS every 4 hours as on tube feeds and use SSI prn    CBG (last 3)  Recent Labs    10/12/20 2354 10/13/20 0410 10/13/20 0756  GLUCAP 154* 126* 129*    -5/6 reasonable control     LOS: 1 days A FACE TO [place] 10/13/2020, 11:29 AM

## 2020-10-13 NOTE — IPOC Note (Signed)
Overall Plan of Care [facility]) Patient Details Name: Devin [name] MRN: [id number] DOB: 1940/10/17  Admitting Diagnosis: Debility, thoracic compression fx  Hospital Problems: Principal Problem:   Debility     Functional Problem List: Nursing Bladder,Bowel,Endurance,Medication Management,Nutrition,Safety,Sensory,Skin Integrity,Pain,Motor  PT Balance,Endurance,Motor,Pain,Perception,Safety  OT Balance,Endurance,Motor,Pain,Safety  SLP Nutrition,Linguistic  TR         Basic ADL's: OT Grooming,Bathing,Dressing,Toileting     Advanced  ADL's: OT Simple Meal Preparation     Transfers: PT Bed Mobility,Bed to [employer]  OT Toilet     Locomotion: PT Ambulation,Stairs,Wheelchair Mobility     Additional Impairments: OT None  SLP Communication,Swallowing      TR      Anticipated Outcomes Item Anticipated Outcome  Self Feeding    Swallowing  Supervision   Basic self-care  Supervision  Toileting  Supervision   Bathroom Transfers Supervision  Bowel/Bladder  manage bowel/bladder with max assist  Transfers  Supervision  Locomotion  Supervision  Communication  Supervision  Cognition     Pain  less than 4  Safety/Judgment  Remain free of fall this admission   Therapy Plan: PT Intensity: Minimum of 1-2 x/day ,45 to 90 minutes PT Frequency: 5 out of 7 days PT Duration Estimated Length of Stay: 3 weeks OT Intensity: Minimum of 1-2 x/day, 45 to 90 minutes OT Frequency: 5 out of 7 days OT Duration/Estimated Length of Stay: 21-24 days SLP Intensity: Minumum of 1-2 x/day, 30 to 90 minutes SLP Frequency: 3 to 5 out of 7 days SLP Duration/Estimated Length of Stay: 3 weeks   Due to the current state of emergency, patients may not be receiving their 3-hours of Medicare-mandated therapy.   Team Interventions: Nursing Interventions Patient/Family Education,Bladder Management,Bowel Management,Disease Management/Prevention,Pain Management,Medication  Management,Skin Care/Wound Civil Service fast streamer Support  PT interventions DME/adaptive equipment instruction,Ambulation/gait training,Community reintegration,Neuromuscular re-education,Psychosocial support,Stair training,UE/LE Strength taining/ROM,Wheelchair propulsion/positioning,Balance/vestibular training,Discharge planning,Functional electrical stimulation,Pain management,Skin care/wound management,Therapeutic Activities,UE/LE Coordination activities,Cognitive remediation/compensation,Disease management/prevention,Functional mobility training,Patient/family education,Splinting/orthotics,Therapeutic Exercise,Visual/perceptual remediation/compensation  OT Interventions Balance/vestibular training,Community reintegration,Discharge planning,Disease mangement/prevention,DME/adaptive equipment instruction,Functional mobility training,Neuromuscular re-education,Pain management,Patient/family education,Psychosocial support,Self Care/advanced ADL retraining,Skin care/wound managment,Therapeutic Activities,Therapeutic Exercise,UE/LE Strength taining/ROM,UE/LE Coordination activities  SLP Interventions Dysphagia/aspiration precaution training,Speech/Language facilitation,Cueing hierarchy,Environmental controls,Therapeutic Activities,Patient/family education,Functional tasks  TR Interventions    SW/CM Interventions     Barriers to Discharge MD  Medical stability  Nursing Decreased caregiver support    PT Decreased caregiver support,Home environment access/layout,Trach,Incontinence    OT Trach,Other (comments) (current rectal tube)    SLP Trach,Incontinence    SW       Team Discharge Planning: Destination: PT-Home ,OT- Home , SLP-Home Projected Follow-up: PT-Home health PT,24 hour supervision/assistance, OT-  Home health OT,24 hour supervision/assistance, SLP-24 hour supervision/assistance,Home Health SLP Projected Equipment Needs: PT-To be  determined, OT- To be determined, SLP-To be determined Equipment Details: PT- , OT-  Patient/family involved in discharge planning: PT- Patient,  OT-Patient,Family member/caregiver, SLP-Patient,Family member/caregiver  MD ELOS: 3 weeks Medical Rehab Prognosis:  Excellent Assessment: The patient has been admitted for CIR therapies with the diagnosis of debility after respiratory failure, recent thoracic compression fx and fusion. The team will be addressing functional mobility, strength, stamina, balance, safety, adaptive techniques and equipment, self-care, bowel and bladder mgt, patient and caregiver education, communication, swallowing, . Goals have been set at supervision for mobility, self-care, swallowing, speech.   Due to the current state of emergency, patients may not be receiving their 3 hours per day of Medicare-mandated therapy.    Meredith Staggers, MD, [facility]      See Team  Conference Notes for weekly updates to the plan of care

## 2020-10-13 NOTE — Progress Notes (Signed)
Inpatient Rehabilitation Medication Review by a Pharmacist  A complete drug regimen review was completed for this patient to identify any potential clinically significant medication issues.  Clinically significant medication issues were identified:  No  Pharmacist comments: n/a  Time spent performing this drug regimen review (minutes):  10   Cristela Felt, PharmD Clinical Pharmacist  10/12/2020 7:56 PM

## 2020-10-13 NOTE — Evaluation (Signed)
Occupational Therapy Assessment and Plan  Patient Details  Name: Devin [name] MRN: [id number] Date of Birth: September 08, 1940  OT Diagnosis: abnormal posture, acute pain, lumbago (low back pain) and muscle weakness (generalized) Rehab Potential: Rehab Potential (ACUTE ONLY): Good ELOS: 21-24 days   Today's Date: 10/13/2020 OT Individual Time: 2111-7356 OT Individual Time Calculation (min): 60 min     Hospital Problem: Principal Problem:   Debility   Past Medical History:  Past Medical History:  Diagnosis Date  . A-fib ([place])   . Adenocarcinoma of left lung, stage 1 ([place]) 03/10/2015  . Ankylosing spondylitis ([place])    Diagnosed during lumbar fracture summer of 2016    . Crohn's disease ([place])   . Gout   . HIstory of basal cell cancer of face    THYROID CA HX  . History of kidney stones   . Hypertension   . Hypothyroidism   . Impotence   . 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 Obesity (BMI 30-39.9)   . Osteoporosis    Pt completed 5 years of fosamax in 2013     . Prostate cancer with recurrence    Treated with prostatectomy with recurrence 2012 with Lupron treatment now him   . Psoriasis   . Thyroid cancer ([place]) 1999   Treated with RAI and total thyroidectomy   . Type II diabetes mellitus ([place])    Past Surgical History:  Past Surgical History:  Procedure Laterality Date  . ABDOMINAL EXPLORATION SURGERY     for small bowel obstruction  . APPENDECTOMY  03/2007  . BACK SURGERY    . BASAL CELL CARCINOMA EXCISION  "several"   "head"  . CARDIAC CATHETERIZATION  03/17/2003  . CHOLECYSTECTOMY N/A 05/07/2016   Procedure: LAPAROSCOPIC CHOLECYSTECTOMY;  Surgeon: Fanny Skates, MD;  Location: [place];  Service: General;  Laterality: N/A;  . COLON SURGERY  03/2007   Resection of cecum, appendix, terminal ileum (approximately/notes 10/10/2010  . CYSTOSCOPY/URETEROSCOPY/HOLMIUM LASER/STENT PLACEMENT Right 07/03/2020   Procedure:  CYSTOSCOPY/RETROGRADE/URETEROSCOPY REMOVAL OF BLADDER STONE;  Surgeon: Raynelle Bring, MD;  Location: [facility];  Service: Urology;  Laterality: Right;  . DIRECT LARYNGOSCOPY N/A 10/04/2020   Procedure: DIRECT LARYNGOSCOPY, PHARYNGOSCOPY WITH BIOPSY;  Surgeon: [name], DO;  Location: [place];  Service: ENT;  Laterality: N/A;  . ESOPHAGOSCOPY N/A 10/04/2020   Procedure: ESOPHAGOSCOPY WITH BIOPSY;  Surgeon: [name], DO;  Location: [place];  Service: ENT;  Laterality: N/A;  . HERNIA REPAIR    . LAMINECTOMY WITH POSTERIOR LATERAL ARTHRODESIS LEVEL 1 N/A 09/15/2020   Procedure: REVISION OF THORACOLUMBAR FUSION, ADDITION OF CROSS-CONNECTORS;  Surgeon: Eustace Moore, MD;  Location: [place];  Service: Neurosurgery;  Laterality: N/A;  . LAPAROSCOPIC CHOLECYSTECTOMY  05/07/2016  . LAPAROSCOPIC LYSIS OF ADHESIONS  05/07/2016  . LAPAROSCOPIC LYSIS OF ADHESIONS N/A 05/07/2016   Procedure: LAPAROSCOPIC LYSIS OF ADHESIONS TIMES ONE HOUR;  Surgeon: Fanny Skates, MD;  Location: [place];  Service: General;  Laterality: N/A;  . POSTERIOR FUSION THORACIC SPINE  02/08/2016   1. Posterior thoracic arthrodesis T7-T11 utilizing morcellized allograft, 2. Posterior thoracic segmental fixation T7-T11 utilizing nuvasive pedicle screws  . PROSTATECTOMY  06/2001   w/bilateral pelvic lymph nose dissection/notes 10/24/2010  . SPINAL FUSION  12/2014   Open reduction internal fixation of L1 Chance fracture with posterior fusion T10-L4 utilizing morcellized allograft and some local autograft, segmental instrumentation T10-L4 inclusive utilizing nuvasive pedicle screws/notes 12/16/2014  . Stress Cardiolite  02/17/2003  . THOROCOTOMY WITH  LOBECTOMY  03/16/2015   Procedure: THOROCOTOMY WITH LOBECTOMY;  Surgeon: Ivin Poot, MD;  Location: [place];  Service: Thoracic;;  . TONSILLECTOMY    . TOTAL THYROIDECTOMY  1997  . Venous Doppler  05/30/2004  . VENTRAL HERNIA REPAIR  04/14/2008  . VIDEO ASSISTED THORACOSCOPY Left  03/16/2015   Procedure: VIDEO ASSISTED THORACOSCOPY;  Surgeon: Ivin Poot, MD;  Location[facility];  Service: Thoracic;  Laterality: Left;    Assessment & Plan Clinical Impression: Patient is a 80 y.o. year old male with history of HTN, T2DM,  Crohn's disease, A fib, Lung cancer s/p LUL lobectomy, restrictive lung disease, ankylosing spondylosis who sustained a fall on 09/07/2020 with progressive pain.  He was admitted on 09/10/2020 with acute on chronic hypercarbic respiratory failure, T11 Chance fracture and agitation.  He underwent posterior thoracic fusion T11-T12 on 04/08 by Dr. Ronnald Ramp and had difficulty tolerating extubation.  He ultimately required tracheostomy on 04/12 and continued to have issues with copious secretion via trach, developed Kleb pneumonia and was treated with course of ceftriaxone.  He also had issues with significant diarrhea due to inability to take Pentasa through his core track and n.p.o. status.  Dr. Hilarie Fredrickson  was consulted for input and recommended managing diarrhea with rectal tube as well as Lomotil with probiotics until able to tolerate p.o.'s.  He was extubated to ATC and was requiring 8 to 10 L via ATC as well as frequent suctioning due to copious secretions.  He had issues with hyperkalemia as well as AKI and nephrology recommended changing of tube feeds to nephro and he was treated with Lokelma, Kayexalate, IV lasix, insulin as well as sodium bicarb with improvement. He underwent biopsy of supraglottic mass (seen on MBS) by Dr. Lestine Mount negative for malignancy. He was admitted to CIR on 04/29 for comprehensive inpatient rehab and was found to have urinary retention requiring in and out catheterization.  On a.m. of 04/30, patient had pulled out his feeding tube and NG tube was extremely difficult to place and tube feeds were held with subsequent hypoglycemic episodes.  Chest x-ray done showing concerns of aspiration pneumonia with bilateral pleural effusions  and he was started on broad-spectrum antibiotic.  He did develop encephalopathy with worsening of hypoxia, concerns of sepsis and was transferred to acute floor for management.  Coretak  was replaced with resumption of tube feeds and water flushes increased to help manage AKI.   PCCM consulted on 05/03 due to difficulty to inability to pass suction via trach sue to obstruction. He underwent bronchoscopy with placement of XLT shiley by Marni Griffon NP. CT neck done revealing improvement in retropharyngeal fullness on right, negative for subglottic stenosis and moderately large right pleural effusion. PCCM felt that no intervention needed for effusion as patient without symptoms and to keep XLT in place for 2 weeks to allow for irritation to resolve and have ENT re-evaluate for input and to decannulate in OR for inspection of entire airway. As procalcitonin trending down with resolution of encephalopathy, antibiotics narrowed to Zosyn.  Tube feeds changed back to Glucerna by dietitian due to concerns of severe malnutrition. Therapy resumed and patient continues to be limited by generalized weakness, BLE weakness with back pain, decreased activity tolerance and balance deficits. CIR recommended due to functional decline.   Patient transferred to CIR on 10/12/2020 .    Patient currently requires mod - total +2  with basic self-care skills secondary to muscle weakness, decreased cardiorespiratoy endurance and decreased oxygen support  and decreased sitting balance, decreased standing balance and decreased balance strategies.  Prior to hospitalization, patient could complete ADLs with independent .  Patient will benefit from skilled intervention to decrease level of assist with basic self-care skills prior to discharge home with care partner.  Anticipate patient will require 24 hour supervision and follow up home health.  OT - End of Session Activity Tolerance: Tolerates 30+ min activity with multiple  rests Endurance Deficit: Yes Endurance Deficit Description: Requires multipe rest breaks during self-care tasks OT Assessment Rehab Potential (ACUTE ONLY): Good OT Barriers to Discharge: Trach;Other (comments) (current rectal tube) OT Patient demonstrates impairments in the following area(s): Balance;Endurance;Motor;Pain;Safety OT Basic ADL's Functional Problem(s): Grooming;Bathing;Dressing;Toileting OT Advanced ADL's Functional Problem(s): Simple Meal Preparation OT Transfers Functional Problem(s): Toilet OT Additional Impairment(s): None OT Plan OT Intensity: Minimum of 1-2 x/day, 45 to 90 minutes OT Frequency: 5 out of 7 days OT Duration/Estimated Length of Stay: 21-24 days OT Treatment/Interventions: Medical illustrator training;Community reintegration;Discharge planning;Disease mangement/prevention;DME/adaptive equipment instruction;Functional mobility training;Neuromuscular re-education;Pain management;Patient/family education;Psychosocial support;Self Care/advanced ADL retraining;Skin care/wound managment;Therapeutic Activities;Therapeutic Exercise;UE/LE Strength taining/ROM;UE/LE Coordination activities OT Basic Self-Care Anticipated Outcome(s): Supervision OT Toileting Anticipated Outcome(s): Supervision OT Bathroom Transfers Anticipated Outcome(s): Supervision OT Recommendation Patient destination: Home Follow Up Recommendations: Home health OT;24 hour supervision/assistance Equipment Recommended: To be determined   OT Evaluation Precautions/Restrictions  Precautions Precautions: Fall;Back Precaution Comments: rectal tube, trach collar, cortrak, TLSO Required Braces or Orthoses: Spinal Brace Spinal Brace: Thoracolumbosacral orthotic;Applied in sitting position Restrictions Weight Bearing Restrictions: No General PT Missed Treatment Reason: Nursing care Vital Signs Therapy Vitals Pulse Rate: 68 Resp: 18 Patient Position (if appropriate): Lying Oxygen Therapy SpO2: 99  % O2 Device: Tracheostomy Collar O2 Flow Rate (L/min): 5 L/min FiO2 (%): 28 % Pain Pain Assessment Pain Scale: 0-10 Pain Score: 0-No pain Home Living/Prior Functioning Home Living Living Arrangements: Spouse/significant other Available Help at Discharge: Family,Available 24 hours/day Type of Home: House Home Access: Stairs to enter Technical brewer of Steps: 3 Entrance Stairs-Rails: Left Home Layout: One level Bathroom Shower/Tub: Multimedia programmer: Handicapped height  Lives With: Spouse IADL History Homemaking Responsibilities: Yes Meal Prep Responsibility: Secondary Laundry Responsibility: Estate agent Paying/Finance Responsibility: No Shopping Responsibility: Secondary Current License: Yes Prior Function Level of Independence: Independent with basic ADLs,Independent with transfers,Independent with homemaking with ambulation,Independent with gait  Able to Take Stairs?: Yes Driving: Yes Vision Baseline Vision/History: No visual deficits Patient Visual Report: No change from baseline Vision Assessment?: No apparent visual deficits Perception  Perception: Within Functional Limits Praxis Praxis: Intact Cognition Overall Cognitive Status: Within Functional Limits for tasks assessed Arousal/Alertness: Awake/alert Orientation Level: Person;Place;Situation Person: Oriented Place: Oriented Situation: Oriented Year: 2022 Month: May Day of Week: Incorrect (Thursday) Memory: Appears intact Immediate Memory Recall: Sock;Blue;Bed Memory Recall Sock: Without Cue Memory Recall Blue: Without Cue Memory Recall Bed: Without Cue Awareness: Appears intact Safety/Judgment: Appears intact Sensation Sensation Light Touch: Appears Intact Proprioception: Appears Intact Coordination Gross Motor Movements are Fluid and Coordinated: No Fine Motor Movements are Fluid and Coordinated: No Coordination and Movement Description: Limited by generalized  weakness Motor  Motor Motor: Within Functional Limits  Trunk/Postural Assessment  Cervical Assessment Cervical Assessment:  (forward head) Thoracic Assessment Thoracic Assessment:  (rounded shoulders) Lumbar Assessment Lumbar Assessment:  (posterior pelvic tilt) Postural Control Postural Control: Deficits on evaluation Righting Reactions: Delayed  Balance Balance Balance Assessed: Yes Static Sitting Balance Static Sitting - Balance Support: Feet supported;Bilateral upper extremity supported Static Sitting - Level of Assistance: 5: Stand by assistance Dynamic Sitting Balance  Dynamic Sitting - Balance Support: Feet supported;During functional activity Dynamic Sitting - Level of Assistance: 4: Min assist Sitting balance - Comments: Pt with preference for UE support on bed. Static Standing Balance Static Standing - Balance Support: Bilateral upper extremity supported;During functional activity Static Standing - Level of Assistance: 3: Mod assist Dynamic Standing Balance Dynamic Standing - Balance Support: Bilateral upper extremity supported;During functional activity Dynamic Standing - Level of Assistance: 3: Mod assist Extremity/Trunk Assessment RUE Assessment RUE Assessment: Within Functional Limits LUE Assessment LUE Assessment: Within Functional Limits  Care Tool Care Tool Self Care Eating        Oral Care         Bathing   Body parts bathed by patient: Right arm;Left arm;Chest;Abdomen;Face Body parts bathed by helper: Front perineal area;Buttocks;Right upper leg;Left upper leg;Right lower leg;Left lower leg   Assist Level: Maximal Assistance - Patient 24 - 49%    Upper Body Dressing(including orthotics)   What is the patient wearing?: Hospital gown only   Assist Level: Maximal Assistance - Patient 25 - 49%    Lower Body Dressing (excluding footwear)   What is the patient wearing?: Incontinence brief Assist for lower body dressing: 2 Helpers    Putting  on/Taking off footwear   What is the patient wearing?: Non-skid slipper socks Assist for footwear: Total Assistance - Patient < 25%       Care Tool Toileting Toileting activity   Assist for toileting: 2 Helpers     Care Tool Bed Mobility Roll left and right activity   Roll left and right assist level: Moderate Assistance - Patient 50 - 74%    Sit to lying activity   Sit to lying assist level: Moderate Assistance - Patient 50 - 74%    Lying to sitting edge of bed activity   Lying to sitting edge of bed assist level: Moderate Assistance - Patient 50 - 74%     Care Tool Transfers Sit to stand transfer   Sit to stand assist level: Moderate Assistance - Patient 50 - 74%    Chair/bed transfer Chair/bed transfer activity did not occur: Safety/medical concerns       Toilet transfer Toilet transfer activity did not occur: Safety/medical concerns       Care Tool Cognition Expression of Ideas and Wants Expression of Ideas and Wants: Frequent difficulty - frequently exhibits difficulty with expressing needs and ideas   Understanding Verbal and Non-Verbal Content Understanding Verbal and Non-Verbal Content: Sometimes understands - understands only basic conversations or simple, direct phrases. Frequently requires cues to understand   Memory/Recall Ability *first 3 days only Memory/Recall Ability *first 3 days only: That he or she is in a hospital/hospital unit;Current season    Refer to Care Plan for Long Term Goals  SHORT TERM GOAL WEEK 1 OT Short Term Goal 1 (Week 1): Pt will complete bathing with mod assist at sit > stand level OT Short Term Goal 2 (Week 1): Pt will don shirt with min assist OT Short Term Goal 3 (Week 1): Pt will complete toilet transfers with mod assist with LRAD  Recommendations for other services: None    Skilled Therapeutic Intervention OT eval completed with discussion of rehab process, OT purpose, POC, ELOS, and goals.  ADL assessment completed with  focus on functional mobility, dynamic sitting balance, and standing balance during self-care tasks of bathing and dressing.  Pt received supine in bed with RN providing meds, requesting therapist return later.  Pt agreeable to self-care from  EOB.  Pt completed preferring to maintain BUE support on bed when sitting EOB, requiring cues to utilize alternating UE during bathing and even using suction.  Completed sit> stand at EOB with mod assist for lifting and heavy reliance on UB support.  Pt noted to have leaking around rectal tube when standing. Completed standing x2 for endurance, then returned to supine for cleaning.  Pt able to roll R and L with mod assist and increased time, requiring +2 for hygiene due to RN to re-inflate rectal tube bulb.  Pt remained supine in bed with all needs in reach.   ADL ADL Grooming: Supervision/safety Where Assessed-Grooming: Edge of bed Upper Body Bathing: Contact guard Where Assessed-Upper Body Bathing: Edge of bed Lower Body Bathing: Dependent Where Assessed-Lower Body Bathing: Bed level Upper Body Dressing: Minimal assistance Where Assessed-Upper Body Dressing: Edge of bed Lower Body Dressing: Maximal assistance Mobility  Bed Mobility Bed Mobility: Supine to Sit;Sit to Supine Supine to Sit: Moderate Assistance - Patient 50-74% Sit to Supine: Moderate Assistance - Patient 50-74% Transfers Sit to Stand: Moderate Assistance - Patient 50-74% Stand to Sit: Moderate Assistance - Patient 50-74%   Discharge Criteria: Patient will be discharged from OT if patient refuses treatment 3 consecutive times without medical reason, if treatment goals not met, if there is a change in medical status, if patient makes no progress towards goals or if patient is discharged from hospital.  The above assessment, treatment plan, treatment alternatives and goals were discussed and mutually agreed upon: by patient and by family  [facility] 10/13/2020, 2:47 PM

## 2020-10-13 NOTE — Progress Notes (Signed)
Initial Nutrition Assessment  DOCUMENTATION CODES:   Non-severe (moderate) malnutrition in context of chronic illness  INTERVENTION:   Continue tube feeding via small-bore NG tube: - Glucerna 1.5 @ 70 ml/hr (tube feeds can be held for up to 4 hours daily for therapies) - Free water flushes of 150 ml q 4 hours  Tube feeding regimen provides 2100 kcal, 116 grams of protein, and 1063 ml of H2O.  Total free water with flushes: 1963 ml  - 1 packet Juven BID per tube, each packet provides 95 calories, 2.5 grams of protein, and 9.8 grams of carbohydrate; also contains L-arginine and L-glutamine, vitamin C, vitamin E, vitamin B-12, zinc, calcium, and calcium Beta-hydroxy-Beta-methylbutyrate to support wound healing  NUTRITION DIAGNOSIS:   Moderate Malnutrition related to chronic illness (Crohn's disease, lung cancer) as evidenced by moderate fat depletion,severe muscle depletion.  GOAL:   Patient will meet greater than or equal to 90% of their needs  MONITOR:   Diet advancement,Labs,Weight trends,TF tolerance,Skin,I & O's  REASON FOR ASSESSMENT:   Consult Enteral/tube feeding initiation and management  ASSESSMENT:   80 year old male with PMH of HTN, T2DM, Crohn's disease, atrial fibrillation, lung cancer s/p LUL lobectomy, ankylosing spondylosis who sustained a fall on 09/07/20 with progressive pain. Pt was admitted on 09/10/20 with acute on chronic hypercarbic respiratory failure, T11 Chance fracture, and agitation. Pt underwent posterior thoracic fusion T11-T12 on 4/8. Pt ultimately required tracheostomy on 4/12. Pt was initially admitted to CIR on 4/29. On 4/30, Pt had pulled out his feeding tube and NG tube was extremely difficult to place and tube feeds were held with subsequent hypoglycemic episodes. There was concern for sepsis and pt was transferred to acute care for management. Pt readmitted to CIR on 5/5.   Pt remains NPO with 10 [place] NG tube in place and bridled. Pt is on  trach collar.  Spoke with pt at bedside. Pt denies any issues with tube feeds at this time. Pt denies any abdominal pain/bloating and denies any N/V. Pt understands need for TF via NG tube. Pt continues to work with SLP towards diet advancement.  Pt reports that his UBW is 185 lbs. He believes that he has lost weight since he was admitted to the hospital. Reviewed weight history in chart. Pt with a 4.1 kg weight loss since 10/06/20. This is a 4.4% weight loss in 1 week which is significant for timeframe.  RD will adjust TF so that pt can be discontinued from pump for up to 4 hours for therapies.  Current TF: Glucerna 1.5 @ 55 ml/hr, ProSource TF 45 ml daily, free water flushes 170 ml q 6 hours  Medications reviewed and include: questran 4 gram BID, SSI q 4 hours, lactobacillus, imodium, Juven BID  Labs reviewed: BUN 27, hemoglobin 8.4 CBG's: 122-154 x 24 hours  UOP: 3400 ml x 24 hours  NUTRITION - FOCUSED PHYSICAL EXAM:  Flowsheet Row Most Recent Value  Orbital Region No depletion  Upper Arm Region Moderate depletion  Thoracic and Lumbar Region Moderate depletion  Buccal Region Mild depletion  Temple Region Moderate depletion  Clavicle Bone Region Severe depletion  Clavicle and Acromion Bone Region Severe depletion  Scapular Bone Region Severe depletion  Dorsal Hand Moderate depletion  Patellar Region Moderate depletion  Anterior Thigh Region Moderate depletion  Posterior Calf Region Moderate depletion  Edema (RD Assessment) Mild  Hair Reviewed  Eyes Reviewed  Mouth Reviewed  Skin Reviewed  Nails Reviewed       Diet Order:  Diet Order            Diet NPO time specified  Diet effective now                 EDUCATION NEEDS:   Education needs have been addressed  Skin:  Skin Assessment: Skin Integrity Issues: Stage I: back Incisions: back, lip Other: L hand, R forearm, L arm  Last BM:  10/12/20 rectal tube  Height:   Ht Readings from Last 1 Encounters:   09/10/20 5' 9.02" (1.753 m)    Weight:   Wt Readings from Last 1 Encounters:  10/13/20 89.2 kg    BMI:  Body mass index is 29.03 kg/m.  Estimated Nutritional Needs:   Kcal:  2000-2200  Protein:  100-120 grams  Fluid:  >/= 2.0 L    Gustavus Bryant, MS, RD, LDN Inpatient Clinical Dietitian Please see AMi[facility] for contact information.

## 2020-10-14 DIAGNOSIS — S22080A Wedge compression fracture of T11-T12 vertebra, initial encounter for closed fracture: Secondary | ICD-10-CM

## 2020-10-14 LAB — GLUCOSE, CAPILLARY
Glucose-Capillary: 122 mg/dL — ABNORMAL HIGH (ref 70–99)
Glucose-Capillary: 125 mg/dL — ABNORMAL HIGH (ref 70–99)
Glucose-Capillary: 129 mg/dL — ABNORMAL HIGH (ref 70–99)
Glucose-Capillary: 143 mg/dL — ABNORMAL HIGH (ref 70–99)
Glucose-Capillary: 159 mg/dL — ABNORMAL HIGH (ref 70–99)
Glucose-Capillary: 163 mg/dL — ABNORMAL HIGH (ref 70–99)
Glucose-Capillary: 98 mg/dL (ref 70–99)

## 2020-10-14 MED ORDER — DIPHENOXYLATE-ATROPINE 2.5-0.025 MG/5ML PO LIQD: 5.0000 mL | Freq: Four times a day (QID) | ORAL | Status: DC | PRN

## 2020-10-14 MED ORDER — TRAZODONE HCL 50 MG PO TABS
50.0000 mg | ORAL_TABLET | Freq: Every day | ORAL | Status: DC
  Administered 2020-10-15 – 2020-10-23 (×8): 50 mg
  Filled 2020-10-14 (×9): qty 1

## 2020-10-14 MED ORDER — TRAZODONE HCL 50 MG PO TABS
50.0000 mg | ORAL_TABLET | Freq: Every day | ORAL | Status: DC
  Administered 2020-10-14: 50 mg via ORAL
  Filled 2020-10-14: qty 1

## 2020-10-14 NOTE — Progress Notes (Signed)
PROGRESS NOTE   Subjective/Complaints:  Pt reports no pain; no SOB- slept badly No voiding yet-  Really needs help with sleep.     ROS: limited by lack of speech/trach   Objective:   No results found. Recent Labs    10/12/20 0249 10/13/20 0514  WBC 16.4* 13.5*  HGB 8.8* 8.4*  HCT 29.3* 28.1*  PLT 449* 361   Recent Labs    10/12/20 0249 10/13/20 0514  NA 135 135  K 4.4 4.6  CL 96* 93*  CO2 33* 37*  GLUCOSE 121* 131*  BUN 31* 27*  CREATININE 0.90 1.01  CALCIUM 8.7* 8.9    Intake/Output Summary (Last 24 hours) at 10/14/2020 1515 Last data filed at 10/14/2020 0235 Gross per 24 hour  Intake 0 ml  Output 4275 ml  Net -4275 ml     Pressure Injury 09/12/20 Back Mid;Lower Stage 1 -  Intact skin with non-blanchable redness of a localized area usually over a bony prominence. Blister-like skin breakdown from patients TSLO brace (Active)  09/12/20 2000  Location: Back  Location Orientation: Mid;Lower  Staging: Stage 1 -  Intact skin with non-blanchable redness of a localized area usually over a bony prominence.  Wound Description (Comments): Blister-like skin breakdown from patients TSLO brace  Present on Admission: Yes    Physical Exam: Vital Signs Blood pressure (!) 118/48, pulse 66, temperature 98.2 F (36.8 C), temperature source Oral, resp. rate 16, weight 89.8 kg, SpO2 100 %.    General: awake, alert, can not verbalize due to trach/no PMV, but can mouth words; daughter at [facility]: conjugate gaze; oropharynx dry; Trach in place Shiley XLT- O2 via TC-  CV: regular rate; no JVD Pulmonary: less rhonchi than admission- diffuse but still less than 2 days ago- adequate air movement- no wheezes/rales heard GI: soft, NT, ND, (+)BS- hypoactive; (+)rectal tube Psychiatric: smiling, trying to interact Neurological: alert  Extremities: No clubbing, cyanosis, or edema. Pulses are 2+ Psych: pt calm. A  little slow to initiate Skin: Clean and intact without signs of breakdown. Scattered bruises on UE's Neuro: awoke easily but somewhat somnolent. Followed commands. Able to cough around trach. No focal CN abnl. Reflexes are 1+ in all 4's. Moves all 4's. UE grossly 3/5 prox to distal. LE: 2/5 HF, KE and 3/5 ADF/APF. Musculoskeletal: no pain with PROM of extremities    Assessment/Plan: 1. Functional deficits which require 3+ hours per day of interdisciplinary therapy in a comprehensive inpatient rehab setting.  Physiatrist is providing close team supervision and 24 hour management of active medical problems listed below.  Physiatrist and rehab team continue to assess barriers to discharge/monitor patient progress toward functional and medical goals  Care Tool:  Bathing    Body parts bathed by patient: Right arm,Left arm,Chest,Abdomen,Face   Body parts bathed by helper: Front perineal area,Buttocks,Right upper leg,Left upper leg,Right lower leg,Left lower leg     Bathing assist Assist Level: Maximal Assistance - Patient 24 - 49%     Upper Body Dressing/Undressing Upper body dressing   What is the patient wearing?: Hospital gown only    Upper body assist Assist Level: Maximal Assistance - Patient 25 - 49%  Lower Body Dressing/Undressing Lower body dressing      What is the patient wearing?: Incontinence brief     Lower body assist Assist for lower body dressing: 2 Helpers     Toileting Toileting    Toileting assist Assist for toileting: 2 Helpers     Transfers Chair/bed transfer  Transfers assist  Chair/bed transfer activity did not occur: Safety/medical concerns  Chair/bed transfer assist level: Maximal Assistance - Patient 25 - 49%     Locomotion Ambulation   Ambulation assist      Assist level: Moderate Assistance - Patient 50 - 74% Assistive device: Hand held assist Max distance: 2'   Walk 10 feet activity   Assist  Walk 10 feet activity did  not occur: Safety/medical concerns        Walk 50 feet activity   Assist Walk 50 feet with 2 turns activity did not occur: Safety/medical concerns         Walk 150 feet activity   Assist Walk 150 feet activity did not occur: Safety/medical concerns         Walk 10 feet on uneven surface  activity   Assist Walk 10 feet on uneven surfaces activity did not occur: Safety/medical concerns         Wheelchair     Assist Will patient use wheelchair at discharge?: No             Wheelchair 50 feet with 2 turns activity    Assist            Wheelchair 150 feet activity     Assist          Blood pressure (!) 118/48, pulse 66, temperature 98.2 F (36.8 C), temperature source Oral, resp. rate 16, weight 89.8 kg, SpO2 100 %.  Medical Problem List and Plan: 1.  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  Antithrombotics: -DVT/anticoagulation:  Pharmaceutical: Other (comment)--Apixaban bid.              -antiplatelet therapy: N/A 3. Pain Management:  hydrocodone prn for severe pain  - Tramadol prn for moderate pain.  5/7 - pain controlled- con't regimen   4. Mood/anxiety/sleep: LCSW to follow for evaluation and support.              -antipsychotic agents: N/A  -xanax bid prn anxiety  -trazodone for sleep prn  -monitor behavior  5/7- hasn't used trazodone- will schedule it and see how he does.  5. Neuropsych: This patient is not fully capable of making decisions on his own behalf. 6. Skin/Wound Care: Pressure relief measures.  7. Fluids/Electrolytes/Nutrition: NPO with tube feeds for nutritional support.             --dietician to assist with input to maintain nutritional status.              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               --PCCM to follow for change and input on scope/decannulation in the  future. Also wants ENT to help with possible tracheal stenosis?             --continue to suction frequently with chest PT to help mobilize secretions.   -vest to help with secretions  5/7- sounds better than admission- con't regimen  9. Acute on chronic respiratory failure: Encourage pulmonary hygiene              --continue Pulmicort nebs bid.  10.  PAF:  Monitor HR tid--continue Atenolol bid with Cardizem tid.   -HR controlled 5/6 10. Hyponatremia: Has resolved on today's labs. Continue to monitor.  11. Acute blood loss anemia, anemia chronic disease: Monitor with serial checks.   -hgb 8.4 5/6 12. Leukocytosis: WBC continues to fluctuate.   -no new signs of infection  -5/6 wbc 13.5.  5/7- will recheck Monday and recheck at least weekly 13. Crohn's disease: Continue rectal tube with probiotic and imodium.   5/7- once able to swallow pills, can try adding home Crohn's meds to help. -monitor for skin breakdown.  14.  Bilateral pleural effusion: Pulmonary hygiene w/ flutter valve.              --continue to monitor for symptoms with increase in activity.   15. Aspiration PNA: zosyn completed 5/5 16. Dysphagia: Continue NPO with Glucerna @ 55 cc/hr             --water flushes 170 cc q4.  17. T2DM: Was on Humalog tid prior to admission.             --will monitor BS every 4 hours as on tube feeds and use SSI prn    CBG (last 3)  Recent Labs    10/14/20 0407 10/14/20 0929 10/14/20 1149  GLUCAP 98 122* 129*    -5/6 reasonable control    5/7- good BG control- con't regimen  LOS: 2 days A FACE TO FACE EVALUATION WAS PERFORMED  Donnie Gedeon 10/14/2020, 3:15 PM

## 2020-10-14 NOTE — Plan of Care (Signed)
  Problem: Consults Goal: RH SPINAL CORD INJURY PATIENT EDUCATION Description:  See Patient Education module for education specifics.  Outcome: Progressing Goal: Skin Care Protocol Initiated - if Braden Score 18 or less Description: If consults are not indicated, leave blank or document N/A Outcome: Progressing Goal: Diabetes Guidelines if Diabetic/Glucose > 140 Description: If diabetic or lab glucose is > 140 mg/dl - Initiate Diabetes/Hyperglycemia Guidelines & Document Interventions  Outcome: Progressing   Problem: SCI BOWEL ELIMINATION Goal: RH STG MANAGE BOWEL WITH ASSISTANCE Description: STG Manage Bowel with min Assistance. Outcome: Progressing Goal: RH STG SCI MANAGE BOWEL WITH MEDICATION WITH ASSISTANCE Description: STG SCI Manage bowel with medication with min assistance. Outcome: Progressing   Problem: SCI BLADDER ELIMINATION Goal: RH STG MANAGE BLADDER WITH ASSISTANCE Description: STG Manage Bladder With min Assistance Outcome: Progressing   Problem: RH SKIN INTEGRITY Goal: RH STG MAINTAIN SKIN INTEGRITY WITH ASSISTANCE Description: STG Maintain Skin Integrity With min Assistance. Outcome: Progressing Goal: RH STG ABLE TO PERFORM INCISION/WOUND CARE W/ASSISTANCE Description: STG Able To Perform Incision/Wound Care With min Assistance. Outcome: Progressing   Problem: RH SAFETY Goal: RH STG ADHERE TO SAFETY PRECAUTIONS W/ASSISTANCE/DEVICE Description: STG Adhere to Safety Precautions With min Assistance/Device. Outcome: Progressing Goal: RH STG DECREASED RISK OF FALL WITH ASSISTANCE Description: STG Decreased Risk of Fall With cues and reminders. Outcome: Progressing   Problem: RH PAIN MANAGEMENT Goal: RH STG PAIN MANAGED AT OR BELOW PT'S PAIN GOAL Description: < 3 on a 0-10 pain scale. Outcome: Progressing   Problem: RH KNOWLEDGE DEFICIT SCI Goal: RH STG INCREASE KNOWLEDGE OF SELF CARE AFTER SCI Description: Patient will be able to demonstrate knowledge of  medication management, pain management, skin/wound care, back precautions with educational materials and handouts provided by staff, at discharge independently. Outcome: Progressing   Problem: Consults Goal: RH GENERAL PATIENT EDUCATION Description: See Patient Education module for education specifics. Outcome: Progressing

## 2020-10-15 ENCOUNTER — Inpatient Hospital Stay (HOSPITAL_COMMUNITY): Payer: Medicare Other

## 2020-10-15 LAB — GLUCOSE, CAPILLARY
Glucose-Capillary: 101 mg/dL — ABNORMAL HIGH (ref 70–99)
Glucose-Capillary: 107 mg/dL — ABNORMAL HIGH (ref 70–99)
Glucose-Capillary: 122 mg/dL — ABNORMAL HIGH (ref 70–99)
Glucose-Capillary: 125 mg/dL — ABNORMAL HIGH (ref 70–99)
Glucose-Capillary: 131 mg/dL — ABNORMAL HIGH (ref 70–99)
Glucose-Capillary: 134 mg/dL — ABNORMAL HIGH (ref 70–99)

## 2020-10-15 NOTE — Progress Notes (Signed)
Physical Therapy Session Note  Patient Details  Name: Devin [name] MRN: [id number] Date of Birth: 12-22-1940  Today's Date: 10/15/2020 PT Individual Time: 1015-1055 PT Individual Time Calculation (min): 40 min  PT Amount of Missed Time (min): 20 Minutes PT Missed Treatment Reason: Patient fatigue  Short Term Goals: Week 1:  PT Short Term Goal 1 (Week 1): Pt will complete bed mobility consistently with minA. PT Short Term Goal 2 (Week 1): Pt will perform bed to chair transfer consistently with minA. PT Short Term Goal 3 (Week 1): Pt will ambulate x25' with minA and LRAD.  Skilled Therapeutic Interventions/Progress Updates:    Session 1: Pt received supine in bed, agreeable to PT session. Pt reports pain in his back at rest, not rated and declines intervention. Pt has onset of buttocks pain while sitting, utilized repositioning for pain management. Assisted pt with donning PMV for use during duration of therapy session, pt remains minimally verbal despite valve placement and utilizes gestures to communicate. Supine to sit with min A for some trunk elevation with cues for log roll technique. Pt is max A to don TLSO while seated EOB. Sit to stand with min A to RW. Stand pivot transfer to TIS chair with RW and min A. Standing alt L/R marches x 10 reps with RW and min A for balance. Pt reports pain in buttocks too great to remain seated in chair, requests to return to bed. Stand pivot transfer back to bed with RW and min A. Attempt to have pt stand again for therex and/or balance, pt declines due to fatigue. Seated BLE marches x 10 reps each. Pt declines to perform any further seated therex and requests to lay back down. Pt is max A to doff TLSO. Sit to sidelying mod A needed for BLE management. Pt on 5L O2 via trach collar at FiO2 28% throughout session, per continuous pulse oximeter SpO2 drops into 70's with activity but SpO2 according to dynamap remains at 90% and above with activity. Pt left in L  sidelying in bed with HOB elevated to 30 degrees, needs in reach, bed alarm in place. Pt missed 20 min of scheduled therapy session due to fatigue.   Therapy Documentation Precautions:  Precautions Precautions: Fall,Back Precaution Comments: rectal tube, trach collar, cortrak, TLSO Required Braces or Orthoses: Spinal Brace Spinal Brace: Thoracolumbosacral orthotic,Applied in sitting position Restrictions Weight Bearing Restrictions: No General: PT Amount of Missed Time (min): 20 Minutes PT Missed Treatment Reason: Patient fatigue    Therapy/Group: Individual Therapy   Excell Seltzer, PT, DPT, CSRS  10/15/2020, 10:57 AM

## 2020-10-15 NOTE — Progress Notes (Signed)
PROGRESS NOTE   Subjective/Complaints:  Pt reports no pain Slept a LOT better last night- happy with sleep- doesn't want to change sleep meds right now.  No other complaints.   ROS: limited by lack of PMV   Objective:   No results found. Recent Labs    10/13/20 0514  WBC 13.5*  HGB 8.4*  HCT 28.1*  PLT 361   Recent Labs    10/13/20 0514  NA 135  K 4.6  CL 93*  CO2 37*  GLUCOSE 131*  BUN 27*  CREATININE 1.01  CALCIUM 8.9    Intake/Output Summary (Last 24 hours) at 10/15/2020 1037 Last data filed at 10/15/2020 1444 Gross per 24 hour  Intake --  Output 3100 ml  Net -3100 ml     Pressure Injury 09/12/20 Back Mid;Lower Stage 1 -  Intact skin with non-blanchable redness of a localized area usually over a bony prominence. Blister-like skin breakdown from patients TSLO brace (Active)  09/12/20 2000  Location: Back  Location Orientation: Mid;Lower  Staging: Stage 1 -  Intact skin with non-blanchable redness of a localized area usually over a bony prominence.  Wound Description (Comments): Blister-like skin breakdown from patients TSLO brace  Present on Admission: Yes    Physical Exam: Vital Signs Blood pressure 104/64, pulse 67, temperature 97.8 F (36.6 C), resp. rate 16, weight 89.8 kg, SpO2 100 %.     General: awake, alert, appropriate, sitting up somewhat in bed- asleep initially, but woke to stimuli;  NAD HENT: conjugate gaze; oropharynx dry; trach XLT in place; O2 via TC- a little secretions noted CV: regular rate; no JVD Pulmonary: somewhat coarse, but no W/R/R- much better air movement than last few days; no PMV GI: soft, NT, ND, (+)BS Psychiatric: appropriate-  Neurological: alert Extremities: No clubbing, cyanosis, or edema. Pulses are 2+ Psych: pt calm. A little slow to initiate Skin: Clean and intact without signs of breakdown. Scattered bruises on UE's Neuro: awoke easily but somewhat  somnolent. Followed commands. Able to cough around trach. No focal CN abnl. Reflexes are 1+ in all 4's. Moves all 4's. UE grossly 3/5 prox to distal. LE: 2/5 HF, KE and 3/5 ADF/APF. Musculoskeletal: no pain with PROM of extremities    Assessment/Plan: 1. Functional deficits which require 3+ hours per day of interdisciplinary therapy in a comprehensive inpatient rehab setting.  Physiatrist is providing close team supervision and 24 hour management of active medical problems listed below.  Physiatrist and rehab team continue to assess barriers to discharge/monitor patient progress toward functional and medical goals  Care Tool:  Bathing    Body parts bathed by patient: Right arm,Left arm,Chest,Abdomen,Face,Right upper leg,Left upper leg   Body parts bathed by helper: Front perineal area,Buttocks,Right lower leg,Left lower leg     Bathing assist Assist Level: Moderate Assistance - Patient 50 - 74%     Upper Body Dressing/Undressing Upper body dressing   What is the patient wearing?: Hospital gown only    Upper body assist Assist Level: Moderate Assistance - Patient 50 - 74%    Lower Body Dressing/Undressing Lower body dressing      What is the patient wearing?: Incontinence brief  Lower body assist Assist for lower body dressing: Total Assistance - Patient < 25%     Toileting Toileting    Toileting assist Assist for toileting: 2 Helpers     Transfers Chair/bed transfer  Transfers assist  Chair/bed transfer activity did not occur: Safety/medical concerns  Chair/bed transfer assist level: Minimal Assistance - Patient > 75%     Locomotion Ambulation   Ambulation assist      Assist level: Moderate Assistance - Patient 50 - 74% Assistive device: Hand held assist Max distance: 2'   Walk 10 feet activity   Assist  Walk 10 feet activity did not occur: Safety/medical concerns        Walk 50 feet activity   Assist Walk 50 feet with 2 turns activity  did not occur: Safety/medical concerns         Walk 150 feet activity   Assist Walk 150 feet activity did not occur: Safety/medical concerns         Walk 10 feet on uneven surface  activity   Assist Walk 10 feet on uneven surfaces activity did not occur: Safety/medical concerns         Wheelchair     Assist Will patient use wheelchair at discharge?: No             Wheelchair 50 feet with 2 turns activity    Assist            Wheelchair 150 feet activity     Assist          Blood pressure 104/64, pulse 67, temperature 97.8 F (36.6 C), resp. rate 16, weight 89.8 kg, SpO2 100 %.  Medical Problem List and Plan: 1.  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  Antithrombotics: -DVT/anticoagulation:  Pharmaceutical: Other (comment)--Apixaban bid.              -antiplatelet therapy: N/A 3. Pain Management:  hydrocodone prn for severe pain  - Tramadol prn for moderate pain.  5/7 - pain controlled- con't regimen   4. Mood/anxiety/sleep: LCSW to follow for evaluation and support.              -antipsychotic agents: N/A  -xanax bid prn anxiety  -trazodone for sleep prn  -monitor behavior  5/7- hasn't used trazodone- will schedule it and see how he does.  5. Neuropsych: This patient is not fully capable of making decisions on his own behalf. 6. Skin/Wound Care: Pressure relief measures.  7. Fluids/Electrolytes/Nutrition: NPO with tube feeds for nutritional support.             --dietician to assist with input to maintain nutritional status.              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               --PCCM to follow for change and input on scope/decannulation in the future. Also wants ENT to help with possible tracheal stenosis?             --continue  to suction frequently with chest PT to help mobilize secretions.   -vest to help with secretions  5/7- sounds better than admission- con't regimen  9. Acute on chronic respiratory failure: Encourage pulmonary hygiene              --continue Pulmicort nebs bid.  10.  PAF:  Monitor HR tid--continue Atenolol bid with Cardizem tid.   -HR controlled 5/6 10. Hyponatremia: Has resolved on today's labs. Continue to monitor.  11. Acute blood loss anemia, anemia chronic disease: Monitor with serial checks.   -hgb 8.4 5/6 12. Leukocytosis: WBC continues to fluctuate.   -no new signs of infection  -5/6 wbc 13.5.  5/7- will recheck Monday and recheck at least weekly 13. Crohn's disease: Continue rectal tube with probiotic and imodium.   5/7- once able to swallow pills, can try adding home Crohn's meds to help. -monitor for skin breakdown.  14.  Bilateral pleural effusion: Pulmonary hygiene w/ flutter valve.              --continue to monitor for symptoms with increase in activity.   15. Aspiration PNA: zosyn completed 5/5 16. Dysphagia: Continue NPO with Glucerna @ 55 cc/hr             --water flushes 170 cc q4.  17. T2DM: Was on Humalog tid prior to admission.             --will monitor BS every 4 hours as on tube feeds and use SSI prn    CBG (last 3)  Recent Labs    10/14/20 2352 10/15/20 0509 10/15/20 0745  GLUCAP 125* 101* 107*    -5/6 reasonable control    5/7- good BG control- con't regimen  LOS: 3 days A FACE TO FACE EVALUATION WAS PERFORMED  Lace Chenevert 10/15/2020, 10:37 AM

## 2020-10-15 NOTE — Progress Notes (Signed)
Occupational Therapy Session Note  Patient Details  Name: Devin [name] MRN: [id number] Date of Birth: 1940-09-07  Today's Date: 10/15/2020 OT Individual Time: 0811-0905 and 4888-9169 OT Individual Time Calculation (min): 54 min and 45 min   Short Term Goals: Week 1:  OT Short Term Goal 1 (Week 1): Pt will complete bathing with mod assist at sit > stand level OT Short Term Goal 2 (Week 1): Pt will don shirt with min assist OT Short Term Goal 3 (Week 1): Pt will complete toilet transfers with mod assist with LRAD  Skilled Therapeutic Interventions/Progress Updates:    1) Treatment session with focus on dynamic sitting balance, sit > stand, and functional mobility during self-care tasks.  Pt received supine in bed with RT present completing breathing treatment.  Therapist returned at completion.  Pt agreeable to self-care and attempting transfers OOB this session.  Pt completed bed mobility with CGA to come to sitting EOB.  Pt demonstrating improved sitting balance with increased ability to maintain sitting balance without reliance on BUE support on bed.  Pt completed UB bathing and stood at EOB (brace donned) with min assist while therapist completed perineal hygiene.  Noted rectal tube to have stool leaking around it - RN notified.  Therapist completed hygiene while pt stood for ~2 mins at a time.  Pt ambulated 5' forward and then backwards with RW with CGA.  Pt initially planning to sit up OOB however due to tube/line placement and RN arriving for meds pt opted to return to bed.  Completed lateral scoots at EOB with CGA prior to returning to supine.  Pt required mod assist to lift BLE in to bed.  Pt remained semi-reclined with all needs in reach and RN present to administer meds and assess buttocks.  2) Treatment session with focus on sit > stand, stand pivot transfers, and improved sitting tolerance.  Pt received semi-reclined in bed with wife and daughter present.  Daughter asking about PMSV,  therefore therapist applied and both therapist and family members encouraging pt to speak.  Pt reports able to get OOB this AM but discomfort in sitting due to rectal tube.  Therapist fashioned a makeshift donut cushion with blankets.  Pt completed bed mobility min assist.  Therapist donned TLSO.  Pt stood from slightly elevated EOB with min assist.  Stand pivot CGA to recliner.  Pt sat on blanket/cushion reporting improved tolerance, however reporting too narrow.  Discussed ordering donut pillow while continuing to modify current setup.  RN arrived requesting to cath pt and pt reporting discomfort sitting OOB.  Pt returned to EOB stand pivot CGA with RW.  Able to lift BLE in to bed with increased time and effort.  Pt remained semi-reclined in bed with family members and nursing staff present.  Therapy Documentation Precautions:  Precautions Precautions: Fall,Back Precaution Comments: rectal tube, trach collar, cortrak, TLSO Required Braces or Orthoses: Spinal Brace Spinal Brace: Thoracolumbosacral orthotic,Applied in sitting position Restrictions Weight Bearing Restrictions: No General:   Vital Signs: Therapy Vitals Pulse Rate: 67 Resp: 16 Oxygen Therapy SpO2: 100 % O2 Device: Tracheostomy Collar O2 Flow Rate (L/min): 5 L/min FiO2 (%): 28 % Pain:  Pt with no c/o pain  2)Pt reports discomfort in sitting due to rectal tube, repositioned.   Therapy/Group: Individual Therapy  [name] 10/15/2020, 9:19 AM

## 2020-10-15 NOTE — Progress Notes (Signed)
Respiratory therapy concerned NG tube out of place. Stated that tube was abnormally long. Nurse checked placement with medical record and placement corresponds. Nurse also had charge nurse double check for clarification. Placement corresponds. On Respiratory second visit continued expressed concerned. MD called and requested xray for confirmation of placement. Xray ordered and completed. Placement confirmed and tube feed restarted. Sanda Linger, LPN

## 2020-10-15 NOTE — Plan of Care (Signed)
  Problem: Consults Goal: RH SPINAL CORD INJURY PATIENT EDUCATION Description:  See Patient Education module for education specifics.  Outcome: Progressing Goal: Skin Care Protocol Initiated - if Braden Score 18 or less Description: If consults are not indicated, leave blank or document N/A Outcome: Progressing Goal: Diabetes Guidelines if Diabetic/Glucose > 140 Description: If diabetic or lab glucose is > 140 mg/dl - Initiate Diabetes/Hyperglycemia Guidelines & Document Interventions  Outcome: Progressing   Problem: SCI BOWEL ELIMINATION Goal: RH STG MANAGE BOWEL WITH ASSISTANCE Description: STG Manage Bowel with min Assistance. Outcome: Progressing Goal: RH STG SCI MANAGE BOWEL WITH MEDICATION WITH ASSISTANCE Description: STG SCI Manage bowel with medication with min assistance. Outcome: Progressing   Problem: SCI BLADDER ELIMINATION Goal: RH STG MANAGE BLADDER WITH ASSISTANCE Description: STG Manage Bladder With min Assistance Outcome: Progressing   Problem: RH SKIN INTEGRITY Goal: RH STG MAINTAIN SKIN INTEGRITY WITH ASSISTANCE Description: STG Maintain Skin Integrity With min Assistance. Outcome: Progressing Goal: RH STG ABLE TO PERFORM INCISION/WOUND CARE W/ASSISTANCE Description: STG Able To Perform Incision/Wound Care With min Assistance. Outcome: Progressing   Problem: RH SAFETY Goal: RH STG ADHERE TO SAFETY PRECAUTIONS W/ASSISTANCE/DEVICE Description: STG Adhere to Safety Precautions With min Assistance/Device. Outcome: Progressing Goal: RH STG DECREASED RISK OF FALL WITH ASSISTANCE Description: STG Decreased Risk of Fall With cues and reminders. Outcome: Progressing   Problem: RH PAIN MANAGEMENT Goal: RH STG PAIN MANAGED AT OR BELOW PT'S PAIN GOAL Description: < 3 on a 0-10 pain scale. Outcome: Progressing   Problem: RH KNOWLEDGE DEFICIT SCI Goal: RH STG INCREASE KNOWLEDGE OF SELF CARE AFTER SCI Description: Patient will be able to demonstrate knowledge of  medication management, pain management, skin/wound care, back precautions with educational materials and handouts provided by staff, at discharge independently. Outcome: Progressing   Problem: Consults Goal: RH GENERAL PATIENT EDUCATION Description: See Patient Education module for education specifics. Outcome: Progressing

## 2020-10-15 NOTE — Progress Notes (Signed)
Physical Therapy Note  Patient Details  Name: Devin [name] MRN: [id number] Date of Birth: 29-Jan-1941 Today's Date: 10/15/2020    Attempted to see patient for 2nd scheduled PT session this date. Pt with family present in room and is watching a sermon on his phone. Pt reports feeling very fatigued from previous 3 sessions this date, declines any therapy at this time due to fatigue. Will follow up per POC as time allows. Pt left seated in bed with needs in reach, family present. Pt missed 45 in of scheduled therapy due to fatigue.    Excell Seltzer, PT, DPT, CSRS  10/15/2020, 2:58 PM

## 2020-10-16 LAB — GLUCOSE, CAPILLARY
Glucose-Capillary: 116 mg/dL — ABNORMAL HIGH (ref 70–99)
Glucose-Capillary: 140 mg/dL — ABNORMAL HIGH (ref 70–99)
Glucose-Capillary: 129 mg/dL — ABNORMAL HIGH (ref 70–99)
Glucose-Capillary: 151 mg/dL — ABNORMAL HIGH (ref 70–99)
Glucose-Capillary: 149 mg/dL — ABNORMAL HIGH (ref 70–99)

## 2020-10-16 LAB — BASIC METABOLIC PANEL
Anion gap: 6 (ref 5–15)
BUN: 38 mg/dL — ABNORMAL HIGH (ref 8–23)
CO2: 37 mmol/L — ABNORMAL HIGH (ref 22–32)
Calcium: 9 mg/dL (ref 8.9–10.3)
Chloride: 88 mmol/L — ABNORMAL LOW (ref 98–111)
Creatinine, Ser: 1.02 mg/dL (ref 0.61–1.24)
GFR, Estimated: >60 mL/min (ref >60)
Glucose, Bld: 132 mg/dL — ABNORMAL HIGH (ref 70–99)
Potassium: 5.2 mmol/L — ABNORMAL HIGH (ref 3.5–5.1)
Sodium: 131 mmol/L — ABNORMAL LOW (ref 135–145)

## 2020-10-16 LAB — CBC WITH DIFFERENTIAL/PLATELET
Abs Immature Granulocytes: 0.16 10*3/uL — ABNORMAL HIGH (ref 0.00–0.07)
Basophils Absolute: 0 10*3/uL (ref 0.0–0.1)
Basophils Relative: 0 %
Eosinophils Absolute: 0.1 10*3/uL (ref 0.0–0.5)
Eosinophils Relative: 1 %
HCT: 27.8 % — ABNORMAL LOW (ref 39.0–52.0)
Hemoglobin: 8.4 g/dL — ABNORMAL LOW (ref 13.0–17.0)
Immature Granulocytes: 2 %
Lymphocytes Relative: 10 %
Lymphs Abs: 1 10*3/uL (ref 0.7–4.0)
MCH: 30 pg (ref 26.0–34.0)
MCHC: 30.2 g/dL (ref 30.0–36.0)
MCV: 99.3 fL (ref 80.0–100.0)
Monocytes Absolute: 0.6 10*3/uL (ref 0.1–1.0)
Monocytes Relative: 6 %
Neutro Abs: 7.8 10*3/uL — ABNORMAL HIGH (ref 1.7–7.7)
Neutrophils Relative %: 81 %
Platelets: 286 10*3/uL (ref 150–400)
RBC: 2.8 MIL/uL — ABNORMAL LOW (ref 4.22–5.81)
RDW: 16.7 % — ABNORMAL HIGH (ref 11.5–15.5)
WBC: 9.8 10*3/uL (ref 4.0–10.5)
nRBC: 0 % (ref 0.0–0.2)

## 2020-10-16 MED ORDER — FREE WATER
175.0000 mL | Status: DC
  Administered 2020-10-16 – 2020-10-18 (×12): 175 mL

## 2020-10-16 NOTE — Progress Notes (Signed)
PROGRESS NOTE   Subjective/Complaints:  Had a reasonable night. Breathing better. Mouth is dry. C/o low back pain. Up ate EOB with OT when I came in  ROS: Patient denies fever, rash, sore throat, blurred vision, nausea, vomiting, diarrhea,  shortness of breath or chest pain,  headache, or mood change.   Objective:   DG Abd 1 View  Result Date: 10/15/2020 CLINICAL DATA:  Nasogastric tube placement. EXAM: ABDOMEN - 1 VIEW COMPARISON:  Oct 09, 2020 FINDINGS: A Dobbhoff feeding tube is seen with its weighted tip overlying the expected region of the gastric antrum/duodenal bulb. The bowel gas pattern is normal. Numerous bilateral radiopaque pedicle screws are seen throughout the thoracolumbar spine. Multiple radiopaque surgical coils are seen scattered throughout the abdomen. No radio-opaque calculi or other significant radiographic abnormality are seen. IMPRESSION: Dobbhoff feeding tube positioning, as described above. Electronically Signed   By: Virgina Norfolk M.D.   On: 10/15/2020 16:19   Recent Labs    10/16/20 0550  WBC 9.8  HGB 8.4*  HCT 27.8*  PLT 286   Recent Labs    10/16/20 0550  NA 131*  K 5.2*  CL 88*  CO2 37*  GLUCOSE 132*  BUN 38*  CREATININE 1.02  CALCIUM 9.0    Intake/Output Summary (Last 24 hours) at 10/16/2020 1033 Last data filed at 10/16/2020 1007 Gross per 24 hour  Intake 1000 ml  Output 900 ml  Net 100 ml     Pressure Injury 09/12/20 Back Mid;Lower Stage 1 -  Intact skin with non-blanchable redness of a localized area usually over a bony prominence. Blister-like skin breakdown from patients TSLO brace (Active)  09/12/20 2000  Location: Back  Location Orientation: Mid;Lower  Staging: Stage 1 -  Intact skin with non-blanchable redness of a localized area usually over a bony prominence.  Wound Description (Comments): Blister-like skin breakdown from patients TSLO brace  Present on Admission: Yes     Physical Exam: Vital Signs Blood pressure (!) 109/49, pulse 68, temperature 97.8 F (36.6 C), temperature source Oral, resp. rate 18, weight 90.6 kg, SpO2 96 %.     Constitutional: No distress . Vital signs reviewed. HEENT: EOMI, oral membranes sl moist, NGT Neck: supple, #6 XLT Cardiovascular: RRR without murmur. No JVD    Respiratory/Chest: not a lot of air movement. A few rhonchi, no wheezes    GI/Abdomen: BS +, non-tender, non-distended Ext: no clubbing, cyanosis, or edema Psych: pleasant and cooperative-  Neurological: alert Skin: Clean and intact without signs of breakdown. Scattered bruises on UE's. Back incision with foam dressing, CDI, some irritation of skin laterally to right Neuro: awake, alert and appropriate.  No focal CN abnl except for weak cough. Reflexes are 1+ in all 4's.. UE grossly 3/5 prox to distal. LE: 2/5 HF, KE and 3/5 ADF/APF. Fair sitting balance. Senses LT/Pain in both LE's Musculoskeletal: mild LB tenderness   Assessment/Plan: 1. Functional deficits which require 3+ hours per day of interdisciplinary therapy in a comprehensive inpatient rehab setting.  Physiatrist is providing close team supervision and 24 hour management of active medical problems listed below.  Physiatrist and rehab team continue to assess barriers to discharge/monitor patient progress  toward functional and medical goals  Care Tool:  Bathing    Body parts bathed by patient: Right arm,Left arm,Chest,Abdomen,Face,Right upper leg,Left upper leg   Body parts bathed by helper: Front perineal area,Buttocks,Right lower leg,Left lower leg     Bathing assist Assist Level: Moderate Assistance - Patient 50 - 74%     Upper Body Dressing/Undressing Upper body dressing   What is the patient wearing?: Hospital gown only    Upper body assist Assist Level: Moderate Assistance - Patient 50 - 74%    Lower Body Dressing/Undressing Lower body dressing      What is the patient  wearing?: Incontinence brief     Lower body assist Assist for lower body dressing: Total Assistance - Patient < 25%     Toileting Toileting    Toileting assist Assist for toileting: 2 Helpers     Transfers Chair/bed transfer  Transfers assist  Chair/bed transfer activity did not occur: Safety/medical concerns  Chair/bed transfer assist level: Minimal Assistance - Patient > 75%     Locomotion Ambulation   Ambulation assist      Assist level: Moderate Assistance - Patient 50 - 74% Assistive device: Hand held assist Max distance: 2'   Walk 10 feet activity   Assist  Walk 10 feet activity did not occur: Safety/medical concerns        Walk 50 feet activity   Assist Walk 50 feet with 2 turns activity did not occur: Safety/medical concerns         Walk 150 feet activity   Assist Walk 150 feet activity did not occur: Safety/medical concerns         Walk 10 feet on uneven surface  activity   Assist Walk 10 feet on uneven surfaces activity did not occur: Safety/medical concerns         Wheelchair     Assist Will patient use wheelchair at discharge?: No             Wheelchair 50 feet with 2 turns activity    Assist            Wheelchair 150 feet activity     Assist          Blood pressure (!) 109/49, pulse 68, temperature 97.8 F (36.6 C), temperature source Oral, resp. rate 18, weight 90.6 kg, SpO2 96 %.  Medical Problem List and Plan: 1.  T11/12 compression fx s/p posterior fusion secondary to fall with debility due to asp PNA/trach/generalzied weakness             -patient may not shower             -ELOS/Goals: 2-3 weeks- min A  --Continue CIR therapies including PT, OT, and SLP  2.  Antithrombotics: -DVT/anticoagulation:  Pharmaceutical: Other (comment)--Apixaban bid.              -antiplatelet therapy: N/A 3. Pain Management:  hydrocodone prn for severe pain  - Tramadol prn for moderate pain.  5/9 - pain  controlled- con't regimen   4. Mood/anxiety/sleep: LCSW to follow for evaluation and support.              -antipsychotic agents: N/A  -xanax bid prn anxiety  -5/9 did well with scheduled trazodone last night. continue 5. Neuropsych: This patient is not fully capable of making decisions on his own behalf. 6. Skin/Wound Care: Pressure relief measures.  7. Fluids/Electrolytes/Nutrition: NPO with tube feeds for nutritional support.  5/9--BUN/Cr trending up. 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               --PCCM to follow for change and input on scope/decannulation in the future. Also wants ENT to help with possible tracheal stenosis?             --continue to suction frequently with chest PT to help mobilize secretions.   -vest to help with secretions  5/9 secretion clearance improved 9. Acute on chronic respiratory failure: Encourage pulmonary hygiene              --continue Pulmicort nebs bid.  10.  PAF:  Monitor HR tid--continue Atenolol bid with Cardizem tid.   -HR controlled 5/9 10. Hyponatremia: Has resolved on today's labs. Continue to monitor.  11. Acute blood loss anemia, anemia chronic disease: Monitor with serial checks.   -hgb 8.4 5/6 12. Leukocytosis: WBC continues to fluctuate.   -no new signs of infection  -5/6 wbc 13.5.  5/9 wbc's down to 9.8 13. Crohn's disease:    - once able to swallow pills, can try adding home Crohn's meds to help.    -continue florastor and questran  -continue rectal tube to help prevent skin breakdown 14.  Bilateral pleural effusion: Pulmonary hygiene w/ flutter valve.              --continue to monitor for symptoms with increase in activity.   15. Aspiration PNA: zosyn completed 5/5 16. Dysphagia: Continue NPO with Glucerna               --increase water flushes to 175 cc q4.  17.  T2DM: Was on Humalog tid prior to admission.             --will monitor BS every 4 hours as on tube feeds and use SSI prn    CBG (last 3)  Recent Labs    10/15/20 2344 10/16/20 0406 10/16/20 0824  GLUCAP 122* 116* 140*    -5/9 controlled  LOS: 4 days A FACE TO [name] 10/16/2020, 10:33 AM

## 2020-10-16 NOTE — Progress Notes (Signed)
Occupational Therapy Session Note  Patient Details  Name: GUNNER IODICE MRN: [id number] Date of Birth: May 20, 1941  Today's Date: 10/16/2020 OT Missed Time: 58 Minutes Missed Time Reason: Nursing care   Short Term Goals: Week 1:  OT Short Term Goal 1 (Week 1): Pt will complete bathing with mod assist at sit > stand level OT Short Term Goal 2 (Week 1): Pt will don shirt with min assist OT Short Term Goal 3 (Week 1): Pt will complete toilet transfers with mod assist with LRAD   Skilled Therapeutic Interventions/Progress Updates:    Pt greeted at time of session semireclined in bed with RN/multiple staff entering the room for cathing/ pt care asking for 15 minutes to complete prior to therapy. Therapist returning every 15 minute intervals to resume OT session, nursing care ongoing and unable to complete OT session at this time. Pt missed 60 mins of OT with multiple attempts made.   Therapy Documentation Precautions:  Precautions Precautions: Fall,Back Precaution Comments: rectal tube, trach collar, cortrak, TLSO Required Braces or Orthoses: Spinal Brace Spinal Brace: Thoracolumbosacral orthotic,Applied in sitting position Restrictions Weight Bearing Restrictions: No    Therapy/Group: Individual Therapy  Viona Gilmore 10/16/2020, 7:26 AM

## 2020-10-16 NOTE — Care Management (Signed)
Inpatient [facility] Individual Statement of Services  Patient Name:  Devin [name]  Date:  10/16/2020  Welcome to the [facility].  Our goal is to provide you with an individualized program based on your diagnosis and situation, designed to meet your specific needs.  With this comprehensive rehabilitation program, you will be expected to participate in at least 3 hours of rehabilitation therapies Monday-Friday, with modified therapy programming on the weekends.  Your rehabilitation program will include the following services:  Physical Therapy (PT), Occupational Therapy (OT), Speech Therapy (ST), 24 hour per day rehabilitation nursing, Therapeutic Recreaction (TR), Psychology, Neuropsychology, Care Coordinator, Rehabilitation Medicine, Nutrition Services, Pharmacy Services and Other  Weekly team conferences will be held on Tuesdays to discuss your progress.  Your Inpatient Rehabilitation Care Coordinator will talk with you frequently to get your input and to update you on team discussions.  Team conferences with you and your family in attendance may also be held.  Expected length of stay: 21-24 days    Overall anticipated outcome: Supervision  Depending on your progress and recovery, your program may change. Your Inpatient Rehabilitation Care Coordinator will coordinate services and will keep you informed of any changes. Your Inpatient Rehabilitation Care Coordinator's name and contact numbers are listed  below.  The following services may also be recommended but are not provided by the [facility] will be made to provide these services after discharge if needed.  Arrangements include referral to agencies that provide these services.  Your insurance has been verified to be:  [facility] Medicare  Your  primary doctor is:  Ravisankar Avva  Pertinent information will be shared with your doctor and your insurance company.  Inpatient Rehabilitation Care Coordinator:  Cathleen Corti [phone] or [phone]  Information discussed with and copy given to patient by: Rana Snare, 10/16/2020, 9:32 AM

## 2020-10-16 NOTE — Progress Notes (Signed)
NAME:  COTY LARSH, MRN:  [id number], DOB:  1940/09/27, LOS: 4 ADMISSION DATE:  10/12/2020, CONSULTATION DATE:  5/3 REFERRING MD:  Verlon Au, CHIEF COMPLAINT:  Dyspnea   History of Present Illness:  This is a 80 year old male who we had been following s/p fall resulting in T11/12 fracture requiring requiring fusion and complicated by prolonged mechanical ventilation and failed extubation attempt x 2 requiring trach. He had been dc'd to rehab. Re-admitted to hospital  after cor trak dislodged and had aspiration PNA c/b hypoglycemia and delirium.  Critical care emergently consulted 2/2 trach obstruction on 5/3  Pertinent  Medical History  afib on chronic ac, DM type II, prior lobectomy (LUL), chron's disease, thyroid cancer, prostate cancer, T11-T12 fracture, required posterior fusion, failed extubation 2/2 upper-airway obstruction. Required trach. Went to rehab.   Recent soft tissue mass at level of supraglottic larynx->neg for malignancy (this was incidentally identified initially on MBS showing thickening prevertebral tissue w/ CT head/neck showing prevertebral soft tissue mass concerning for malignancy   Significant Hospital Events: Including procedures, antibiotic start and stop dates in addition to other pertinent events    4/29 admitted for cortrak malposition, aspiration and encephalopathy  5/3 PCCM emergently consulted for inability to suction and upper airway obstruction Had sig increase of inflammatory tissue on posterior wall of trachea. Was able to bypass with distal XLT. Still has some intermittent tracheomalacia w/ dynamic collapse of airway noted w/ cough   . 5/9 in rehab. Voice quality better. Feeling stronger   Interim History / Subjective:  Looks much better c/w last week Objective   Blood pressure (Abnormal) 109/49, pulse 68, temperature 97.8 F (36.6 C), temperature source Oral, resp. rate 18, weight 90.6 kg, SpO2 96 %.    FiO2 (%):  [21 %-28 %] 21 %    Intake/Output Summary (Last 24 hours) at 10/16/2020 1141 Last data filed at 10/16/2020 0529 Gross per 24 hour  Intake 1000 ml  Output 900 ml  Net 100 ml   Filed Weights   10/14/20 0501 10/15/20 0500 10/16/20 0500  Weight: 89.8 kg 89.8 kg 90.6 kg    Examination:  General this is a 80 year old male. He is resting in bed and in no distress HENT NCAT no JVD. MM are dry. Distal XLT trach is unremarkable w/out drainage. Quality of phonation has improved.  Pulm some scattered rhonchi but no accessory use. 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 Often times this can resolve with repositioning of trach which we did by placing XLT distal.  He is doing well status post change> I am please to see the quality to his phonation improve.   Plan: Cont to follow him 1-2 times a week Cont PMV as much as possible during day-time hours Will push trach change back for week after next (to be done week of 23rd) will do this w/ bronch so I can image the airway. Might be best to do this in ENDO.  Will be conservative in re: to decannulation. I will work in Ambulance person w/ ENT I think needs to have DL to evaluate supraglottal and further eval of subglottal area by ENT to ensure he  doesn't need dilation or laser.  Cont current rehab focus  Best practice (right click and "Reselect all SmartList Selections" daily)   Per primary  Erick Colace ACNP-BC [place] Pager # [phone] OR # [phone] if no answer

## 2020-10-16 NOTE — Discharge Instructions (Signed)

## 2020-10-16 NOTE — Progress Notes (Signed)
Occupational Therapy Session Note  Patient Details  Name: Devin [name] MRN: [id number] Date of Birth: 01-11-1941  Today's Date: 10/16/2020 OT Individual Time: 6759-1638 OT Individual Time Calculation (min): 42 min  and Today's Date: 10/16/2020 OT Missed Time: 18 Minutes Missed Time Reason: Patient fatigue   Short Term Goals: Week 1:  OT Short Term Goal 1 (Week 1): Pt will complete bathing with mod assist at sit > stand level OT Short Term Goal 2 (Week 1): Pt will don shirt with min assist OT Short Term Goal 3 (Week 1): Pt will complete toilet transfers with mod assist with LRAD  Skilled Therapeutic Interventions/Progress Updates:   Pt greeted semi-reclined in bed without speaking valve on. OT placed PMV and pt was able to voice but still not very audible. Encouragement to turn voice on more when communicating with therapist. With encouragement, pt agreeable to sit up at EOB for UB bathing/dressing tasks. Dependent initially for OT to placed TED hose and non skid socks. Pt completed supine to sit with mod A to elevate trunk. Pt tolerated sitting EOB for 20 minutes. Encouragement to use UEs for UB bathing as pt dependent on UEs for balance. Pt able to briefly remove B UEs from bed and maintain sitting balance with supervision. No overt LOB throughout bathing and dressing tasks. Nursing also administered medications vis NG tube while pt seated EOB. Pt tolerated a total of ~20 minutes with verbal cues throughout for upright posture. OT encouraged pt to try to stand, but he refused standing or getting OOB to the wc. Pt stated he felt "woozy" and needed to return to bed. Min A to get LEs back in bed and max A to scoot up in bed. Pt declined any further activity 2/2 fatigue. Pt left semi-reclined in bed with bed alarm on, call bell and suction present, needs met.   Therapy Documentation Precautions:  Precautions Precautions: Fall,Back Precaution Comments: rectal tube, trach collar, cortrak,  TLSO Required Braces or Orthoses: Spinal Brace Spinal Brace: Thoracolumbosacral orthotic,Applied in sitting position Restrictions Weight Bearing Restrictions: No General: General OT Amount of Missed Time: 18 Minutes Pain: Pain Assessment Pain Scale: 0-10 Pain Score: 2  Pain Type: Acute pain Pain Location: Back Pain Orientation: Lower Pain Descriptors / Indicators: Aching Pain Onset: On-going Patients Stated Pain Goal: 2 Pain Intervention(s): Repositioned Multiple Pain Sites: No   Therapy/Group: Individual Therapy  [name] 10/16/2020, 9:40 AM

## 2020-10-16 NOTE — Progress Notes (Signed)
Patient ID: Devin [name], male   DOB: [birth year], 80 y.o.   MRN: [id number]  SW made efforts to complete assessment with pt but pt reports he cannot talk, and was ok with SW speaking with his wife. Pt did not appear to be a in a good mood.   SW called pt wife to Devin [name] [phone]) to complete assessment.SW introduced self, and explained role, and d/c process. Wife aware SW will follow-up tomorrow after team conference on pt care needs. After several dropped calls, SW was informed she is here in hospital. SW went to room to make efforts to complete assessment, however, pt reported patient care need (I.e. bladder: I/O). SW informed his assigned Therapist, sports. Pt wife asked if HCPOA the same as POA as she was dealing with a billing issue on how the ambulance company wrote down the wrong name and they were billed for the trip. SW explained how HCPOA is completed here in the hospital and encouraged her to follow-up with the billing dept to see if they will ask HCPOA. Pt reported that he has an HCPOA on file from 2 year ago. No documentation of form in his chart. Pt states he has one at home for his wife to review. Pt ready for nurse to assist with cathing. SW left room for pt care, and pt to have OT after.   SW will make efforts tomorrow to complete assessment.   Loralee Pacas, MSW, [place] Office: [phone] Cell: [phone] Fax: [phone]

## 2020-10-16 NOTE — Plan of Care (Signed)
  Problem: Consults Goal: RH SPINAL CORD INJURY PATIENT EDUCATION Description:  See Patient Education module for education specifics.  Outcome: Progressing Goal: Skin Care Protocol Initiated - if Braden Score 18 or less Description: If consults are not indicated, leave blank or document N/A Outcome: Progressing Goal: Diabetes Guidelines if Diabetic/Glucose > 140 Description: If diabetic or lab glucose is > 140 mg/dl - Initiate Diabetes/Hyperglycemia Guidelines & Document Interventions  Outcome: Progressing   Problem: SCI BOWEL ELIMINATION Goal: RH STG MANAGE BOWEL WITH ASSISTANCE Description: STG Manage Bowel with min Assistance. Outcome: Progressing Goal: RH STG SCI MANAGE BOWEL WITH MEDICATION WITH ASSISTANCE Description: STG SCI Manage bowel with medication with min assistance. Outcome: Progressing   Problem: SCI BLADDER ELIMINATION Goal: RH STG MANAGE BLADDER WITH ASSISTANCE Description: STG Manage Bladder With min Assistance Outcome: Progressing   Problem: RH SKIN INTEGRITY Goal: RH STG MAINTAIN SKIN INTEGRITY WITH ASSISTANCE Description: STG Maintain Skin Integrity With min Assistance. Outcome: Progressing Goal: RH STG ABLE TO PERFORM INCISION/WOUND CARE W/ASSISTANCE Description: STG Able To Perform Incision/Wound Care With min Assistance. Outcome: Progressing   Problem: RH SAFETY Goal: RH STG ADHERE TO SAFETY PRECAUTIONS W/ASSISTANCE/DEVICE Description: STG Adhere to Safety Precautions With min Assistance/Device. Outcome: Progressing Goal: RH STG DECREASED RISK OF FALL WITH ASSISTANCE Description: STG Decreased Risk of Fall With cues and reminders. Outcome: Progressing   Problem: RH PAIN MANAGEMENT Goal: RH STG PAIN MANAGED AT OR BELOW PT'S PAIN GOAL Description: < 3 on a 0-10 pain scale. Outcome: Progressing   Problem: RH KNOWLEDGE DEFICIT SCI Goal: RH STG INCREASE KNOWLEDGE OF SELF CARE AFTER SCI Description: Patient will be able to demonstrate knowledge of  medication management, pain management, skin/wound care, back precautions with educational materials and handouts provided by staff, at discharge independently. Outcome: Progressing   Problem: Consults Goal: RH GENERAL PATIENT EDUCATION Description: See Patient Education module for education specifics. Outcome: Progressing

## 2020-10-16 NOTE — Evaluation (Signed)
Speech Language Pathology Cognitive-Linguistic Evaluation   Patient Details  Name: Devin [name] MRN: [id number] Date of Birth: 1941/04/22  SLP Diagnosis: Cognitive Impairments  Rehab Potential: Fair ELOS: 3 weeks    Today's Date: 10/16/2020 SLP Individual Time: 7408-1448 SLP Individual Time Calculation (min): 40 min   Hospital Problem: Principal Problem:   Thoracic compression fracture ([place]) Active Problems:   Debility  Past Medical History:  Past Medical History:  Diagnosis Date  . A-fib ([place])   . 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 Ankylosing spondylitis ([place])    Diagnosed during lumbar fracture summer of 2016    . Crohn's disease ([place])   . Gout   . HIstory of basal cell cancer of face    THYROID CA HX  . History of kidney stones   . Hypertension   . Hypothyroidism   . Impotence   . 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 Obesity (BMI 30-39.9)   . Osteoporosis    Pt completed 5 years of fosamax in 2013     . Prostate cancer with recurrence    Treated with prostatectomy with recurrence 2012 with Lupron treatment now him   . Psoriasis   . Thyroid cancer ([place]) 1999   Treated with RAI and total thyroidectomy   . Type II diabetes mellitus ([place])    Past Surgical History:  Past Surgical History:  Procedure Laterality Date  . ABDOMINAL EXPLORATION SURGERY     for small bowel obstruction  . APPENDECTOMY  03/2007  . BACK SURGERY    . BASAL CELL CARCINOMA EXCISION  "several"   "head"  . CARDIAC CATHETERIZATION  03/17/2003  . CHOLECYSTECTOMY N/A 05/07/2016   Procedure: LAPAROSCOPIC CHOLECYSTECTOMY;  Surgeon: Fanny Skates, MD;  Location: [place];  Service: General;  Laterality: N/A;  . COLON SURGERY  03/2007   Resection of cecum, appendix, terminal ileum (approximately/notes 10/10/2010  . CYSTOSCOPY/URETEROSCOPY/HOLMIUM LASER/STENT PLACEMENT Right 07/03/2020   Procedure: CYSTOSCOPY/RETROGRADE/URETEROSCOPY REMOVAL OF  BLADDER STONE;  Surgeon: Raynelle Bring, MD;  Location: [facility];  Service: Urology;  Laterality: Right;  . DIRECT LARYNGOSCOPY N/A 10/04/2020   Procedure: DIRECT LARYNGOSCOPY, PHARYNGOSCOPY WITH BIOPSY;  Surgeon: [name], DO;  Location: [place];  Service: ENT;  Laterality: N/A;  . ESOPHAGOSCOPY N/A 10/04/2020   Procedure: ESOPHAGOSCOPY WITH BIOPSY;  Surgeon: [name], DO;  Location: [place];  Service: ENT;  Laterality: N/A;  . HERNIA REPAIR    . LAMINECTOMY WITH POSTERIOR LATERAL ARTHRODESIS LEVEL 1 N/A 09/15/2020   Procedure: REVISION OF THORACOLUMBAR FUSION, ADDITION OF CROSS-CONNECTORS;  Surgeon: Eustace Moore, MD;  Location: [place];  Service: Neurosurgery;  Laterality: N/A;  . LAPAROSCOPIC CHOLECYSTECTOMY  05/07/2016  . LAPAROSCOPIC LYSIS OF ADHESIONS  05/07/2016  . LAPAROSCOPIC LYSIS OF ADHESIONS N/A 05/07/2016   Procedure: LAPAROSCOPIC LYSIS OF ADHESIONS TIMES ONE HOUR;  Surgeon: Fanny Skates, MD;  Location: [place];  Service: General;  Laterality: N/A;  . POSTERIOR FUSION THORACIC SPINE  02/08/2016   1. Posterior thoracic arthrodesis T7-T11 utilizing morcellized allograft, 2. Posterior thoracic segmental fixation T7-T11 utilizing nuvasive pedicle screws  . PROSTATECTOMY  06/2001   w/bilateral pelvic lymph nose dissection/notes 10/24/2010  . SPINAL FUSION  12/2014   Open reduction internal fixation of L1 Chance fracture with posterior fusion T10-L4 utilizing morcellized allograft and some local autograft, segmental instrumentation T10-L4 inclusive utilizing nuvasive pedicle screws/notes 12/16/2014  . Stress Cardiolite  02/17/2003  . THOROCOTOMY WITH LOBECTOMY  03/16/2015   Procedure:  THOROCOTOMY WITH LOBECTOMY;  Surgeon: Ivin Poot, MD;  Location: [place];  Service: Thoracic;;  . TONSILLECTOMY    . TOTAL THYROIDECTOMY  1997  . Venous Doppler  05/30/2004  . VENTRAL HERNIA REPAIR  04/14/2008  . VIDEO ASSISTED THORACOSCOPY Left 03/16/2015   Procedure: VIDEO ASSISTED  THORACOSCOPY;  Surgeon: Ivin Poot, MD;  Location: [facility];  Service: Thoracic;  Laterality: Left;    Assessment / Plan / Recommendation Clinical Impression Skilled treatment session focused on a cognitive evaluation and speech goals. Upon arrival, patient was awake with PMSV in place. SLP facilitated session by providing overall Min A verbal cues for use of verbal expression and an increased vocal intensity to achieve ~90% intelligibility at the sentence level throughout session. SLP also administered the [name] Mental Status Examination ([place]) . Patient scored  25/30 points with a score of 27 or above considered normal. Patient demonstrated deficits in short-term recall and working memory. Patient left upright in bed with alarm on and all needs within reach. Continue with current plan of care.    Skilled Therapeutic Interventions          Administered a cognitive-linguistic evaluation, please see above for details   SLP Assessment  Patient will need skilled [facility] [facility] during CIR admission    Recommendations  Patient may use Passy-Muir Speech Valve: During all waking hours (remove during sleep) PMSV Supervision: Full MD: Please consider changing trach tube to : Cuffless SLP Diet Recommendations: NPO;Alternative means - temporary Medication Administration: Via alternative means Oral Care Recommendations: Oral care QID Recommendations for Other Services: Neuropsych consult Patient destination: Home Follow up Recommendations: 24 hour supervision/assistance;Home Health SLP Equipment Recommended: To be determined    SLP Frequency 3 to 5 out of 7 days   SLP Duration  SLP Intensity  SLP Treatment/Interventions 3 weeks  Minumum of 1-2 x/day, 30 to 90 minutes  Dysphagia/aspiration precaution training;Speech/Language facilitation;Cueing hierarchy;Environmental controls;Therapeutic Activities;Patient/family education;Functional tasks    Pain Pain  Assessment Pain Scale: 0-10 Pain Score: 2  Pain Type: Acute pain Pain Location: Back Pain Orientation: Lower Pain Descriptors / Indicators: Aching Pain Onset: On-going Patients Stated Pain Goal: 2 Pain Intervention(s): Repositioned Multiple Pain Sites: No   SLP Evaluation Cognition Overall Cognitive Status: Impaired/Different from baseline Arousal/Alertness: Awake/alert Orientation Level: Oriented X4 Memory: Impaired Memory Impairment: Decreased short term memory Awareness: Impaired Awareness Impairment: Emergent impairment Problem Solving: Impaired Problem Solving Impairment: Functional complex Safety/Judgment: Appears intact  Comprehension Auditory Comprehension Overall Auditory Comprehension: Appears within functional limits for tasks assessed Expression Expression Primary Mode of Expression: Verbal Verbal Expression Overall Verbal Expression: Appears within functional limits for tasks assessed Written Expression Dominant Hand: Right Written Expression: Within Functional Limits Oral Motor Oral Motor/Sensory Function Overall Oral Motor/Sensory Function: Within functional limits Motor Speech Overall Motor Speech: Impaired Respiration: Impaired Level of Impairment: Word Phonation: Low vocal intensity;Hoarse Articulation: Within functional limitis Intelligibility: Intelligibility reduced Word: 75-100% accurate Phrase: 75-100% accurate Sentence: 75-100% accurate Motor Planning: Witnin functional limits  Care Tool Care Tool Cognition Expression of Ideas and Wants Expression of Ideas and Wants: Frequent difficulty - frequently exhibits difficulty with expressing needs and ideas   Understanding Verbal and Non-Verbal Content Understanding Verbal and Non-Verbal Content: Usually understands - understands most conversations, but misses some part/intent of message. Requires cues at times to understand   Memory/Recall Ability *first 3 days only Memory/Recall Ability  *first 3 days only: That he or she is in a hospital/hospital unit;Current season  Short Term Goals: Week 1: SLP Short Term Goal 1 (Week 1): Patient will initiate verbal responses vs use of gestures for communication with PMSV in place in 50% of opportunities with Mod verbal cues. SLP Short Term Goal 2 (Week 1): Patient will utilize an increased vocal intensity at the phrase level to achieve ~80% intelligibility with Mod A verbal cues. SLP Short Term Goal 3 (Week 1): Patient will recall swallowing and safety precautions for trials of ice chips wth Mod A multimodal cues. SLP Short Term Goal 4 (Week 1): Patient will utilize external memory aids to recall new, daily information with supervision level verbal cues.  Refer to Care Plan for Long Term Goals  Recommendations for other services: Neuropsych  Discharge Criteria: Patient will be discharged from SLP if patient refuses treatment 3 consecutive times without medical reason, if treatment goals not met, if there is a change in medical status, if patient makes no progress towards goals or if patient is discharged from hospital.  The above assessment, treatment plan, treatment alternatives and goals were discussed and mutually agreed upon: by patient  Lynnsie Linders 10/16/2020, 11:59 AM

## 2020-10-16 NOTE — Progress Notes (Signed)
Physical Therapy Session Note  Patient Details  Name: Devin [name] MRN: [id number] Date of Birth: 1940-10-03  Today's Date: 10/16/2020 PT Individual Time: 1015-1045 PT Individual Time Calculation (min): 30 min   Short Term Goals: Week 1:  PT Short Term Goal 1 (Week 1): Pt will complete bed mobility consistently with minA. PT Short Term Goal 2 (Week 1): Pt will perform bed to chair transfer consistently with minA. PT Short Term Goal 3 (Week 1): Pt will ambulate x25' with minA and LRAD.  Skilled Therapeutic Interventions/Progress Updates:    Pt received seated in bed, agreeable to PT session. Pt requesting to brush his teeth, confirmed with SLP that pt needs to utilize suction toothbrush for oral hygiene. Pt is setup A for brushing his teeth at bed level via use of suction toothbrush, cues to utilize suction and to not let mouth cleanser sit in his mouth. Pt then sits up to side of bed at CGA level with use of bedrail. Sit to stand with min A to RW. Pt able to take sidesteps towards [facility] with RW and CGA. Sit to supine CGA with use of bedrail. Pt left seated in bed with HOB elevated to 30 degrees, needs in reach, bed alarm in place.  Therapy Documentation Precautions:  Precautions Precautions: Fall,Back Precaution Comments: rectal tube, trach collar, cortrak, TLSO Required Braces or Orthoses: Spinal Brace Spinal Brace: Thoracolumbosacral orthotic,Applied in sitting position Restrictions Weight Bearing Restrictions: No   Therapy/Group: Individual Therapy   Excell Seltzer, PT, DPT, CSRS  10/16/2020, 3:20 PM

## 2020-10-17 LAB — BASIC METABOLIC PANEL
Anion gap: 6 (ref 5–15)
BUN: 41 mg/dL — ABNORMAL HIGH (ref 8–23)
CO2: 37 mmol/L — ABNORMAL HIGH (ref 22–32)
Calcium: 9.3 mg/dL (ref 8.9–10.3)
Chloride: 88 mmol/L — ABNORMAL LOW (ref 98–111)
Creatinine, Ser: 1.08 mg/dL (ref 0.61–1.24)
GFR, Estimated: >60 mL/min (ref >60)
Glucose, Bld: 115 mg/dL — ABNORMAL HIGH (ref 70–99)
Potassium: 5.2 mmol/L — ABNORMAL HIGH (ref 3.5–5.1)
Sodium: 131 mmol/L — ABNORMAL LOW (ref 135–145)

## 2020-10-17 LAB — GLUCOSE, CAPILLARY
Glucose-Capillary: 118 mg/dL — ABNORMAL HIGH (ref 70–99)
Glucose-Capillary: 120 mg/dL — ABNORMAL HIGH (ref 70–99)
Glucose-Capillary: 137 mg/dL — ABNORMAL HIGH (ref 70–99)
Glucose-Capillary: 139 mg/dL — ABNORMAL HIGH (ref 70–99)
Glucose-Capillary: 147 mg/dL — ABNORMAL HIGH (ref 70–99)
Glucose-Capillary: 155 mg/dL — ABNORMAL HIGH (ref 70–99)
Glucose-Capillary: 156 mg/dL — ABNORMAL HIGH (ref 70–99)
Glucose-Capillary: 160 mg/dL — ABNORMAL HIGH (ref 70–99)
Glucose-Capillary: 169 mg/dL — ABNORMAL HIGH (ref 70–99)

## 2020-10-17 NOTE — Progress Notes (Signed)
Physical Therapy Session Note  Patient Details  Name: Devin [name] MRN: [id number] Date of Birth: 1941-03-03  Today's Date: 10/17/2020 PT Individual Time: 0947-1020 PT Individual Time Calculation (min): 33 min   Short Term Goals: Week 1:  PT Short Term Goal 1 (Week 1): Pt will complete bed mobility consistently with minA. PT Short Term Goal 2 (Week 1): Pt will perform bed to chair transfer consistently with minA. PT Short Term Goal 3 (Week 1): Pt will ambulate x25' with minA and LRAD.  Skilled Therapeutic Interventions/Progress Updates:  Patient seated upright in TIS w/c on entrance to room. TLSO in place and glucerna feeding active through cortrak NG tube. Patient alert and agreeable to PT session though states that he is fatigued. When asked, pt states low level pain at surgical site at back with no numerical indication. Pt's orders are for FiO2 at 8L/ 35%.   Therapeutic Activity: Transfers: Patient performed STS and SPVT transfers using RW and Min A for controlled power up and descent to sit.  Provided vc for foot placement, BUE progression, upright posture. Pt completes ambulation from seat to seat within room and is able to cover 10 foot distance. Upon sitting, pt states he is fatigued and unsure if he can continue. Significant seated therapeutic rest break prior to completing return SPVT. During seated rest, NT enters room and relates that bladder scan is needed. Pt appears upset re: need to transfer again. Setup for SPVT from seat to EOB. Min A for power up with pt using bedrail for LUE support and RUE to this PT's elbow.  Bed Mobility: While seated EOB, TLSO doffed with Max A for time/ effort. Pt performed sit --> supine with Min/ Mod A for BLE and UB to reach sidelying then roll to supine. VC/ tc required for technique to maintain back precautions.  Patient supine in bed at end of session with brakes locked and HOB elevated to 20deg. Pt left in NT care in order to perform bladder  scan.   Therapy Documentation Precautions:  Precautions Precautions: Fall,Back Precaution Comments: rectal tube, trach collar, cortrak, TLSO Required Braces or Orthoses: Spinal Brace Spinal Brace: Thoracolumbosacral orthotic,Applied in sitting position Restrictions Weight Bearing Restrictions: No  Therapy/Group: Individual Therapy  Alger Simons PT, DPT 10/17/2020, 7:59 AM

## 2020-10-17 NOTE — Progress Notes (Signed)
Speech Language Pathology Daily Session Note  Patient Details  Name: [name] MRN: [id number] Date of Birth: 1940-08-26  Today's Date: 10/17/2020 SLP Individual Time: 1100-1125 SLP Individual Time Calculation (min): 25 min  Short Term Goals: Week 1: SLP Short Term Goal 1 (Week 1): Patient will initiate verbal responses vs use of gestures for communication with PMSV in place in 50% of opportunities with Mod verbal cues. SLP Short Term Goal 2 (Week 1): Patient will utilize an increased vocal intensity at the phrase level to achieve ~80% intelligibility with Mod A verbal cues. SLP Short Term Goal 3 (Week 1): Patient will recall swallowing and safety precautions for trials of ice chips wth Mod A multimodal cues. SLP Short Term Goal 4 (Week 1): Patient will utilize external memory aids to recall new, daily information with supervision level verbal cues.  Skilled Therapeutic Interventions: Skilled treatment session focused on speech and swallowing goals. Upon arrival, PMSV was already donned. SLP facilitated session by providing Min A verbal cues for verbal initiation and use of an increased vocal intensity at the phrase level to achieve ~90% intelligibility. Patient performed oral care via the suction toothbrush with set-up assist and consumed trials of ice chips (~3). Patient utilized multiple swallows with intermittent overt s/s of aspiration and had to orally expectorate residuals X 2. Recommend ongoing trials. Patient left upright in bed with alarm on and all needs within reach. Continue with current plan of care.      Pain No/Denies Pain   Therapy/Group: Individual Therapy  Nickey Canedo 10/17/2020, 12:20 PM

## 2020-10-17 NOTE — Progress Notes (Signed)
Patient resting in semi fowler position, clinical status remain unchanged,no acute resspiratory or discomfort, Continue regime , TF Glucerna at 70 cc/he and tolerated, Cortax to right nare  no evident of skin breakage of irritation. Trach [place] # 6 in place, RT in assessed and provided treatment, oral care encouraged but refused at this time by patient,repositioned and turned.Sleep chart initiated and monitored.

## 2020-10-17 NOTE — Progress Notes (Signed)
[name], RN  Rehab Admission Coordinator  Physical Medicine and Rehabilitation  PMR Pre-admission     Signed  Date of Service:  10/11/2020  3:52 PM      Related encounter: Admission (Discharged) from 10/07/2020 in [facility]       Signed          Show:Clear all [x] Manual[x] Template[x] Copied  Added by: [x] [name], RN   [] Hover for details  PMR Admission Coordinator Pre-Admission Assessment   Patient: Devin [name] is an 80 y.o., male MRN: [id number] DOB: 1941/03/28 Height:   Weight: 91.3 kg                                                                                                                                                  Insurance Information HMO: yes    PPO:      PCP:      IPA:      80/20:      OTHER:  PRIMARY: [employer] [employer]      Policy#: [id number]      Subscriber: pt CM Name: [name]    Phone#: [phone] option 7     Fax#: [phone] Pre-Cert#: [id number] approved for until 5/11 when updates are due      Employer:  Benefits:  Phone #: [phone]     Name: 4/28 Eff. Date: 06/10/2020     Deduct: none      Out of Pocket Max: $3600      Life Max: none  CIR: $295 co pay per day days 1 until 6      SNF: no copay days 1 until 20; $188 co pay per day days 21 until 49; no copay days 41 until 100 Outpatient: $30 per visit     Co-Pay: visits per medical neccesity Home Health: 80%      Co-Pay: 20% DME: 80%     Co-Pay: 205 Providers: in network  SECONDARY: none        Financial Counselor:       Phone#:    [employer]" for patients in Inpatient Rehabilitation Facilities with attached "Privacy Act [place] Records" was provided and verbally reviewed with: Patient and Family   Emergency Contact Information         Contact Information     Name Relation Home Work Mobile    [name] Spouse [phone]   [phone]    [name] Daughter      [phone]    [name] Daughter     [phone]       Current Medical History  Patient Admitting Diagnosis: Debility   History of Present Illness: 80 y.o. male with history of A fib, Crohn's, lung cancer s/p RSXN, thyroid cancer, prostate cancer,  T2DM, COPD, ankylosing spondylitis with chronic LBP and gait disorder who sustained a  fall on 09/07/2020 with progressive pain.  History taken from chart review and patient due to trach.  He was admitted on 09/10/2020 with ongoing pain, shortness of breath, and agitation. He became unresponsive in ED requiring intubation for acute on chronic hypercarbic respiratory failure. CT chest revealed acute T11 vertebral fracture and TLSO recommended by Dr. Ronnald Ramp. Once cleared by PCCM, he underwent posterior thoracic fusion T11-T12 on 09/15/2020 he was unable to tolerate extubation X 2 despite decadron to help with edema and ultimately required tracheostomy on 09/19/2020.  Hospital course further complicated by atrial fibrillation with RVR, copious secretions,  Kleb PNA, fluid overload, diarrhea due to crohn's. He was started on weaning trials and has been off vent since 04/18. He was started on PMSV trials and therapy ongoing.   On 4/22 failed barium swallow evaluation. Per SLP, noted thickening of the prevertebral tissue at the level of C 4-5. CT cervical spine recommended and showed soft tissue which raised suspicion of laryngeal carcinoma. ENT, Dr. Fredric Dine consulted. Eliquis held and patient underwent  biopsy 4/27 with results penidng. Some worsening of secretions noted. Began on Robitussin /DM syrup, Mucomyst, albuterol, hypertonic saline and chest PT.    Nephrology consulted 4/28 for hyperkalemia of 7.0. Nephrology recommended Kayexalate, insulin and glucose, sodium bicarb. and repeat BMP. IV lasix given and transitioned tube feeds to Nepro. Baseline renal function Cr 1.3- 1.4. Optimize volume and glucose control. K 5.1 on 4/29. Patient at baseline uses  extended release po meds which he is unable to take po at this time for his Chron's.Admitted to CIR 10/06/2020.   Readmitted to acute hospital on 10/07/2020 after patient dislodged his Cortrak and developed hypoglycemia, encephalopathy and had aspiration PNA. Cortrak replaced by IR on 5/1 and now continues tube feeds. Glucose stabilized. There was concern for superimposed aspiration pneumonia and initially continued with Vanc and Zosyn. Now narrowed down to solely Zosyn. Encephalopathy felt to be due to severe hypoglycemia and now resolved. Continue to follow BMET for AKI resolved and IV fluids discontinued. Path results from laryngeal BX negative , noted to have reactive changes with chronic inflammation and focal hemorrhage. No malignancy.    Emergent trach exchange on 5/3 with findings of increased inflammatory tissue on the post wall of the trachea with recs to keep distal XLT and possible change trach in 2 weeks per PCCM. They will follow .Still some intermittent tracheomalacia with dynamic collapse of airway noted with cough per PCCM. They will continue to assess weekly. Recommend to be conservative to approach decannulation with plan to work with ENT with possible airway assessment before decannulation with OR assessment of upper supraglottal area as well as sub glottal area.    Past Medical History      Past Medical History:  Diagnosis Date  . A-fib ([place])    . 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 Ankylosing spondylitis ([place])      Diagnosed during lumbar fracture summer of 2016    . Crohn's disease ([place])    . Gout    . HIstory of basal cell cancer of face      THYROID CA HX  . History of kidney stones    . Hypertension    . Hypothyroidism    . Impotence    . 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 Obesity (BMI 30-39.9)    . Osteoporosis      Pt completed 5 years of fosamax in 2013     .  Prostate cancer with recurrence      Treated with  prostatectomy with recurrence 2012 with Lupron treatment now him   . Psoriasis    . Thyroid cancer ([place]) 1999    Treated with RAI and total thyroidectomy   . Type II diabetes mellitus (HCC)        Family History  family history includes CAD in his mother.   Prior Rehab/Hospitalizations:  Has the patient had prior rehab or hospitalizations prior to admission? Yes CIR 4/29 until 09/27/20 and then readmitted to acute   Has the patient had major surgery during 100 days prior to admission? Yes   Current Medications    Current Facility-Administered Medications:  .  acetaminophen (TYLENOL) 160 MG/5ML solution 500 mg, 500 mg, Per Tube, Q6H, Smith, Rondell A, MD, 500 mg at 10/12/20 9326 .  albuterol (PROVENTIL) (2.5 MG/3ML) 0.083% nebulizer solution 2.5 mg, 2.5 mg, Inhalation, Q6H PRN, Verlon Au, Jai-Gurmukh, MD .  ALPRAZolam Duanne Moron) tablet 0.5 mg, 0.5 mg, Per Tube, BID PRN, Donne Hazel, MD .  apixaban Arne Cleveland) tablet 5 mg, 5 mg, Per Tube, BID, Nita Sells, MD, 5 mg at 10/12/20 0919 .  atenolol (TENORMIN) tablet 25 mg, 25 mg, Per Tube, BID, Nita Sells, MD, 25 mg at 10/12/20 0919 .  budesonide (PULMICORT) nebulizer solution 0.5 mg, 0.5 mg, Nebulization, BID, Smith, Rondell A, MD, 0.5 mg at 10/12/20 0759 .  chlorhexidine (PERIDEX) 0.12 % solution 15 mL, 15 mL, Mouth/Throat, BID, Smith, Rondell A, MD, 15 mL at 10/12/20 0919 .  Chlorhexidine Gluconate Cloth 2 % PADS 6 each, 6 each, Topical, Daily, Nita Sells, MD, 6 each at 10/11/20 1000 .  cholestyramine (QUESTRAN) packet 4 g, 4 g, Per Tube, BID AC, Samtani, Jai-Gurmukh, MD, 4 g at 10/12/20 1304 .  diltiazem (CARDIZEM) 10 mg/ml oral suspension 30 mg, 30 mg, Oral, Q8H, Samtani, Jai-Gurmukh, MD, 30 mg at 10/12/20 7124 .  diphenhydrAMINE (BENADRYL) 12.5 MG/5ML elixir 12.5-25 mg, 12.5-25 mg, Per Tube, Q6H PRN, Smith, Rondell A, MD .  feeding supplement (GLUCERNA 1.5 CAL) liquid 1,000 mL, 1,000 mL, Per Tube, Continuous,  Samtani, Jai-Gurmukh, MD, Last Rate: 55 mL/hr at 10/11/20 1510, 1,000 mL at 10/11/20 1510 .  feeding supplement (PROSource TF) liquid 45 mL, 45 mL, Per Tube, Daily, Verlon Au, Jai-Gurmukh, MD, 45 mL at 10/12/20 0918 .  free water 170 mL, 170 mL, Per Tube, Q4H, Samtani, Jai-Gurmukh, MD, 170 mL at 10/12/20 1200 .  Gerhardt's butt cream, , Topical, BID, Norval Morton, MD, Given at 10/11/20 2122 .  insulin aspart (novoLOG) injection 0-9 Units, 0-9 Units, Subcutaneous, Q4H, Nita Sells, MD, 1 Units at 10/12/20 1330 .  lactobacillus (FLORANEX/LACTINEX) granules 1 g, 1 g, Per Tube, TID WC, Samtani, Jai-Gurmukh, MD, 1 g at 10/12/20 1322 .  levothyroxine (SYNTHROID) tablet 125 mcg, 125 mcg, Per Tube, QAC breakfast, Nita Sells, MD, 125 mcg at 10/12/20 [phone] .  lidocaine (XYLOCAINE) 2 % jelly, , Topical, PRN, Smith, Rondell A, MD .  lidocaine (XYLOCAINE) 2 % viscous mouth solution 15 mL, 15 mL, Mouth/Throat, PRN, Tamala Julian, Rondell A, MD, 2 mL at 10/08/20 1238 .  loperamide HCl (IMODIUM) 1 MG/7.5ML suspension 2 mg, 2 mg, Per Tube, BID PRN, Verlon Au, Jai-Gurmukh, MD .  loperamide HCl (IMODIUM) 1 MG/7.5ML suspension 4 mg, 4 mg, Per Tube, QID, Verlon Au, Jai-Gurmukh, MD, 4 mg at 10/12/20 1305 .  MEDLINE mouth rinse, 15 mL, Mouth Rinse, 10 times per day, Fuller Plan A, MD, 15 mL at 10/12/20 [phone] .  milk  and molasses enema, 1 enema, Rectal, Daily PRN, Norval Morton, MD .  nutrition supplement (JUVEN) (JUVEN) powder packet 1 packet, 1 packet, Per Tube, BID BM, Nita Sells, MD, 1 packet at 10/12/20 0919 .  ondansetron (ZOFRAN) injection 4 mg, 4 mg, Intravenous, Q6H PRN, Smith, Rondell A, MD .  oxyCODONE (ROXICODONE) 5 MG/5ML solution 5 mg, 5 mg, Per Tube, Q4H PRN, Nita Sells, MD, 5 mg at 10/12/20 0720 .  piperacillin-tazobactam (ZOSYN) IVPB 3.375 g, 3.375 g, Intravenous, Q8H, Kris Mouton, [facility], Last Rate: 12.5 mL/hr at 10/12/20 0918, 3.375 g at 10/12/20 0918 .  senna-docusate  (Senokot-S) tablet 1 tablet, 1 tablet, Per Tube, QHS PRN, Norval Morton, MD   Patients Current Diet:     Diet Order                      Diet NPO time specified  Diet effective now                      Precautions / Restrictions Precautions Precautions: Fall,Back Precaution Booklet Issued: No Precaution Comments: rectal tube, trach collar, cortrak, TLSO Spinal Brace: Thoracolumbosacral orthotic,Applied in sitting position Restrictions Weight Bearing Restrictions: No    Has the patient had 2 or more falls or a fall with injury in the past year?No   Prior Activity Level Community (5-7x/wk): independent, active and driving   Prior Functional Level Prior Function Level of Independence: Independent   Self Care: Did the patient need help bathing, dressing, using the toilet or eating?  Independent   Indoor Mobility: Did the patient need assistance with walking from room to room (with or without device)? Independent   Stairs: Did the patient need assistance with internal or external stairs (with or without device)? Independent   Functional Cognition: Did the patient need help planning regular tasks such as shopping or remembering to take medications? Independent   Home Assistive Devices / Equipment Home Equipment: Walker - 2 wheels,Bedside commode   Prior Device Use: Indicate devices/aids used by the patient prior to current illness, exacerbation or injury? None of the above   Current Functional Level Cognition   Overall Cognitive Status: Difficult to assess Difficult to assess due to: Tracheostomy Orientation Level: Intubated/Tracheostomy - Unable to assess Following Commands: Follows one step commands with increased time General Comments: Pt with flat affect, seems related to fatigue; endorses frustration related to hospital admission. Family present and reports "he looks really good today" (more alert and interactive)    Extremity Assessment (includes  Sensation/Coordination)   Upper Extremity Assessment: Defer to OT evaluation RUE Deficits / Details: strength 3+/5 LUE Deficits / Details: strength 3+/5  Lower Extremity Assessment: Generalized weakness RLE Deficits / Details: grossly 3/5 RLE Coordination: decreased gross motor LLE Deficits / Details: grossly 3/5 LLE Coordination: decreased gross motor     ADLs   Overall ADL's : Needs assistance/impaired Eating/Feeding: NPO Grooming: Minimal assistance,Sitting Upper Body Bathing: Moderate assistance,Sitting Lower Body Bathing: Maximal assistance,Sitting/lateral leans,Bed level,Cueing for safety Upper Body Dressing : Maximal assistance,Sitting Upper Body Dressing Details (indicate cue type and reason): Pt partially donning brace. Requiring Max A for positioning and stabilizing brace Lower Body Dressing: Maximal assistance,Cueing for safety,Sitting/lateral leans,Sit to/from stand Toilet Transfer: Minimal assistance,+2 for safety/equipment,Ambulation,RW Toilet Transfer Details (indicate cue type and reason): MinA for support and for line management. Toileting- Clothing Manipulation and Hygiene: Total assistance,Sitting/lateral lean,Sit to/from stand Toileting - Clothing Manipulation Details (indicate cue type and reason): has catheter and  rectal tube Functional mobility during ADLs: +2 for physical assistance,Minimal assistance,Cueing for safety,Rolling walker General ADL Comments: Pt limited by decreased strength, endurance for transferring to recliner and poor ability to care for self without extensive assist.     Mobility   Overal bed mobility: Needs Assistance Bed Mobility: Rolling,Sidelying to Sit Rolling: Supervision Sidelying to sit: HOB elevated,Min guard General bed mobility comments: Received sitting EOB with RN/NT     Transfers   Overall transfer level: Needs assistance Equipment used: Rolling walker (2 wheeled) Transfers: Sit to/from Stand Sit to Stand: Min assist,Mod  assist Stand pivot transfers: Min guard General transfer comment: Pt reliant on minA to power into standing from elevated bed height; modA for trunk elevation and eccentric control with sit<>stand from recliner, heavy reliance on UE support     Ambulation / Gait / Stairs / Wheelchair Mobility   Ambulation/Gait Ambulation/Gait assistance: Min assist,+2 safety/equipment,Mod assist Gait Distance (Feet): 4 Feet Assistive device: Rolling walker (2 wheeled) Gait Pattern/deviations: Step-to pattern,Trunk flexed General Gait Details: Short ambulation distance from bed to recliner with RW, min-modA for stability; noted bilateral knee instabiltiy and pt c/o back pain; pt declined further distance secondary to fatigue, but agreeable to additional standing trials after prolonged seated rest Gait velocity: Decreased Gait velocity interpretation: <1.31 ft/sec, indicative of household ambulator     Posture / Balance Balance Overall balance assessment: Needs assistance Sitting-balance support: Feet supported Sitting balance-Leahy Scale: Fair Standing balance support: Bilateral upper extremity supported,During functional activity Standing balance-Leahy Scale: Poor Standing balance comment: Reliant on UE support and external assist; (+) LOB with minimal challenge to balance     Special needs/care consideration Rectal tube; history of Chron's and unable to take his home po med regimen/Asacol [place] XLT distal 6 mm uncuffed distal placed 5/3 by PCCM NG 10 fr right nare 72 cm placed 5/1 claustrophobic Anxious at baseline and uses xanax prn When calls to front desk, ask that staff respond in timely manner Chest PT with respiratory therapy He is claustrophobic    Previous Home Environment  Living Arrangements: Spouse/significant other  Lives With: Spouse Available Help at Discharge: Family,Available 24 hours/day Type of Home: House Home Layout: One level Home Access: Stairs to enter [employer]  Shower/Tub: Multimedia programmer: Handicapped height Bathroom Accessibility: Yes Home Care Services: No Additional Comments: daughter present at time of this eval.   Discharge Living Setting Plans for Discharge Living Setting: Patient's home,Lives with (comment) Type of Home at Discharge: House Discharge Home Layout: One level Discharge Home Access: Stairs to enter Entrance Stairs-Rails: Right Entrance Stairs-Number of Steps: 3 Discharge Bathroom Shower/Tub: Walk-in shower Discharge Bathroom Toilet: Handicapped height Discharge Bathroom Accessibility: Yes How Accessible: Accessible via walker Does the patient have any problems obtaining your medications?: No   Social/Family/Support Systems Contact Information: wife and 3 daughters Anticipated Caregiver: wife and family Anticipated Caregiver's Contact Information: see above Ability/Limitations of Caregiver: none Caregiver Availability: 24/7 Discharge Plan Discussed with Primary Caregiver: Yes Is Caregiver In Agreement with Plan?: Yes Does Caregiver/Family have Issues with Lodging/Transportation while Pt is in Rehab?: No   Goals Patient/Family Goal for Rehab: supervision to min asisst PT, OT and SLP Expected length of stay: ELOS 2 to 3 weeks Pt/Family Agrees to Admission and willing to participate: Yes Program Orientation Provided & Reviewed with Pt/Caregiver Including Roles  & Responsibilities: Yes   Decrease burden of Care through IP rehab admission: n/a   Possible need for SNF placement upon discharge:not anticipated   Patient Condition: This  patient's medical and functional status has changed since the consult dated: 09/27/2020 in which the Rehabilitation Physician determined and documented that the patient's condition is appropriate for intensive rehabilitative care in an inpatient rehabilitation facility. See "History of Present Illness" (above) for medical update. Functional changes are: mod assist. Patient's  medical and functional status update has been discussed with the Rehabilitation physician and patient remains appropriate for inpatient rehabilitation. Will admit to inpatient rehab today.   Preadmission Screen Completed By:  Cleatrice Burke, RN, 10/12/2020 2:06 PM ______________________________________________________________________   Discussed status with Dr. Dagoberto Ligas on 10/12/2020 at 1406 and received approval for admission today.   Admission Coordinator:  Cleatrice Burke, time 4461 Date 10/12/2020             Cosigned by: Courtney Heys, MD at 10/12/2020  2:25 PM    Revision History                             Note Details  Author [name], RN File Time 10/12/2020  2:06 PM  Author Type Rehab Admission Coordinator Status Signed  Last Editor [name], RN Service Physical Medicine and [place] # [id number] Admit Date 10/12/2020

## 2020-10-17 NOTE — Progress Notes (Signed)
Devin Arn, MD  Physician  Physical Medicine and Rehabilitation  Consult Note     Signed  Date of Service:  09/27/2020  8:49 AM      Related encounter: ED to Hosp-Admission (Discharged) from 09/10/2020 in [place]       Signed      Expand All Collapse All     Show:Clear all [x] Manual[x] Template[] Copied  Added by: [x] Love, Ivan Anchors, PA-C[x] Devin Arn, MD   [] Hover for details           Physical Medicine and Rehabilitation Consult     Reason for Consult: Functional deficits Referring Physician: Dr. Pietro Cassis   HPI: Devin [name] is a 80 y.o. male with history of A fib, Crohn's, lung cancer s/p RSXN, thyroid cancer, prostate cancer,  T2DM, COPD, ankylosing spondylitis with chronic LBP and gait disorder who sustained a fall on 09/07/2020 with progressive pain.  History taken from chart review and patient due to trach.  He was admitted on 09/10/2020 with ongoing pain, shortness of breath, and agitation. He became unresponsive in ED requiring intubation for acute on chronic hypercarbic respiratory failure. CT chest revealed acute T11 vertebral fracture and TLSO recommended by Dr. Ronnald Ramp. Once cleared by PCCM, he underwent posterior thoracic fusion T11-T12 on 09/15/2020 he was unable to tolerate extubation X 2 despite decadron to help with edema and ultimately required tracheostomy on 09/19/2020.  Hospital course further complicated by atrial fibrillation with RVR, copious secretions,  Kleb PNA, fluid overload, diarrhea due to crohn's. He was started on weaning trials and has been off vent since 04/18. He was started on PMSV trials and therapy ongoing. CIR recommended due to debility.   Review of Systems  Constitutional: Negative for chills and fever.  HENT: Negative for hearing loss and tinnitus.   Eyes: Negative for blurred vision and double vision.  Respiratory: Positive for sputum production. Negative for cough and shortness of breath.    Cardiovascular: Negative for chest pain.  Gastrointestinal: Positive for diarrhea. Negative for heartburn and nausea.  Genitourinary: Negative for dysuria and urgency.  Skin: Negative for itching and rash.  Neurological: Positive for weakness. Negative for dizziness and headaches.  Psychiatric/Behavioral: The patient does not have insomnia.   All other systems reviewed and are negative.       Past Medical History:  Diagnosis Date  . A-fib ([place])    . Adenocarcinoma of left lung, stage 1 ([place]) 03/10/2015  . Ankylosing spondylitis ([place])      Diagnosed during lumbar fracture summer of 2016    . Crohn's disease ([place])    . Gout    . HIstory of basal cell cancer of face      THYROID CA HX  . History of kidney stones    . Hypertension    . Hypothyroidism    . Impotence    . 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 Obesity (BMI 30-39.9)    . Osteoporosis      Pt completed 5 years of fosamax in 2013     . Prostate cancer with recurrence      Treated with prostatectomy with recurrence 2012 with Lupron treatment now him   . Psoriasis    . Thyroid cancer ([place]) 1999    Treated with RAI and total thyroidectomy   . Type II diabetes mellitus ([place])             Past Surgical History:  Procedure Laterality Date  .  ABDOMINAL EXPLORATION SURGERY        for small bowel obstruction  . APPENDECTOMY   03/2007  . BACK SURGERY      . BASAL CELL CARCINOMA EXCISION   "several"    "head"  . CARDIAC CATHETERIZATION   03/17/2003  . CHOLECYSTECTOMY N/A 05/07/2016    Procedure: LAPAROSCOPIC CHOLECYSTECTOMY;  Surgeon: Fanny Skates, MD;  Location: [facility];  Service: General;  Laterality: N/A;  . COLON SURGERY   03/2007    Resection of cecum, appendix, terminal ileum (approximately/notes 10/10/2010  . CYSTOSCOPY/URETEROSCOPY/HOLMIUM LASER/STENT PLACEMENT Right 07/03/2020    Procedure: CYSTOSCOPY/RETROGRADE/URETEROSCOPY REMOVAL OF BLADDER STONE;  Surgeon: Raynelle Bring, MD;   Location: [facility];  Service: Urology;  Laterality: Right;  . HERNIA REPAIR      . LAMINECTOMY WITH POSTERIOR LATERAL ARTHRODESIS LEVEL 1 N/A 09/15/2020    Procedure: REVISION OF THORACOLUMBAR FUSION, ADDITION OF CROSS-CONNECTORS;  Surgeon: Eustace Moore, MD;  Location: [facility];  Service: Neurosurgery;  Laterality: N/A;  . LAPAROSCOPIC CHOLECYSTECTOMY   05/07/2016  . LAPAROSCOPIC LYSIS OF ADHESIONS   05/07/2016  . LAPAROSCOPIC LYSIS OF ADHESIONS N/A 05/07/2016    Procedure: LAPAROSCOPIC LYSIS OF ADHESIONS TIMES ONE HOUR;  Surgeon: Fanny Skates, MD;  Location: [facility];  Service: General;  Laterality: N/A;  . POSTERIOR FUSION THORACIC SPINE   02/08/2016    1. Posterior thoracic arthrodesis T7-T11 utilizing morcellized allograft, 2. Posterior thoracic segmental fixation T7-T11 utilizing nuvasive pedicle screws  . PROSTATECTOMY   06/2001    w/bilateral pelvic lymph nose dissection/notes 10/24/2010  . SPINAL FUSION   12/2014    Open reduction internal fixation of L1 Chance fracture with posterior fusion T10-L4 utilizing morcellized allograft and some local autograft, segmental instrumentation T10-L4 inclusive utilizing nuvasive pedicle screws/notes 12/16/2014  . Stress Cardiolite   02/17/2003  . THOROCOTOMY WITH LOBECTOMY   03/16/2015    Procedure: THOROCOTOMY WITH LOBECTOMY;  Surgeon: Ivin Poot, MD;  Location: [facility];  Service: Thoracic;;  . TONSILLECTOMY      . TOTAL THYROIDECTOMY   1997  . Venous Doppler   05/30/2004  . VENTRAL HERNIA REPAIR   04/14/2008  . VIDEO ASSISTED THORACOSCOPY Left 03/16/2015    Procedure: VIDEO ASSISTED THORACOSCOPY;  Surgeon: Ivin Poot, MD;  Location[facility];  Service: Thoracic;  Laterality: Left;           Family History  Problem Relation Age of Onset  . CAD Mother    . Cancer Neg Hx          No cancer in the patient's immediate family, except of course for the patient himself, as noted      Social History:  Married. Retired. Independent without AD. Per   reports that he quit smoking about 30 years ago. His smoking use included cigarettes. He has a 60.00 pack-year smoking history. He has never used smokeless tobacco. He reports previous alcohol use. He reports that he does not use drugs.      Allergies: No Known Allergies            Medications Prior to Admission  Medication Sig Dispense Refill  . albuterol (VENTOLIN HFA) 108 (90 Base) MCG/ACT inhaler Inhale 2 puffs into the lungs every 6 (six) hours as needed for wheezing or shortness of breath. 8 g 6  . ALPRAZolam (XANAX) 0.5 MG tablet Take 0.5 mg by mouth 2 (two) times daily as needed for anxiety.      [place] Kitchen amoxicillin-clavulanate (AUGMENTIN) 875-125 MG tablet Take 1  tablet by mouth every 12 (twelve) hours.      Mar[place] Kitchen apixaban (ELIQUIS) 5 MG TABS tablet Take 5 mg by mouth 2 (two) times daily.      [place] Kitchen atenolol (TENORMIN) 25 MG tablet Take 1 tablet (25 mg total) by mouth 2 (two) times daily.      . busPIRone (BUSPAR) 15 MG tablet Take 15 mg by mouth 2 (two) times daily.       . calcium-vitamin D (OSCAL WITH D) 500-200 MG-UNIT tablet Take 2 tablets by mouth daily.       . hydrochlorothiazide (HYDRODIURIL) 12.5 MG tablet Take 12.5 mg by mouth every morning.      [place] Kitchen HYDROcodone-acetaminophen (NORCO/VICODIN) 5-325 MG tablet Take 1 tablet by mouth every 6 (six) hours as needed (pain).      . insulin lispro (HUMALOG KWIKPEN) 100 UNIT/ML KiwkPen INJECT UNDER THE SKIN THREE TIMES DAILY( 25 UNITS, 25 UNITS AND 20 UNITS RESPECTIVELY BEFORE MEALS) (Patient taking differently: Inject 20-30 Units into the skin See admin instructions. Inject 25 units subcutaneously before breakfast, 20 units before lunch and 30 units before supper) 30 mL 5  . levothyroxine (SYNTHROID) 125 MCG tablet Take 1 tablet (125 mcg total) by mouth daily before breakfast. 30 tablet 0  . Mesalamine 800 MG TBEC Take 1,600 mg by mouth 2 (two) times daily.      . methocarbamol (ROBAXIN) 500 MG tablet Take 500 mg by mouth every 8 (eight) hours.       . Multiple Vitamins-Iron (DAILY-VITE/IRON/BETA-CAROTENE) TABS Take 1 tablet by mouth daily.       . Tiotropium Bromide Monohydrate (SPIRIVA RESPIMAT) 2.5 MCG/ACT AERS Inhale 2 puffs into the lungs daily. 4 g 0  . zinc gluconate 50 MG tablet Take 50 mg by mouth daily.      . B-D ULTRAFINE III SHORT PEN 31G X 8 MM MISC USE 1 PEN NEEDLE FOUR TIMES DAILY. 100 each 0  . diltiazem (CARDIZEM) 30 MG tablet Take 1 tablet every 4 hours AS NEEDED for heart rate >100 as long as blood pressure >100. (Patient not taking: No sig reported) 45 tablet 1  . Glucose Blood (ASCENSIA CONTOUR TEST VI) Use as directed to test two times a day      . glucose blood (BAYER CONTOUR TEST) test strip TEST BLOOD SUGAR TWICE DAILY AS DIRECTED. 200 each 2  . glucose blood test strip 1 each by Other route 2 times daily.      . Insulin Pen Needle 31G X 8 MM MISC Inject 31 g into the skin.      [place] Kitchen leuprolide (LUPRON) 11.25 MG injection Inject 11.25 mg into the muscle every 6 (six) months.           Home: Home Living Family/patient expects to be discharged to:: Inpatient rehab Living Arrangements: Spouse/significant other Additional Comments: daughter present at time of this eval.  Functional History: Prior Function Level of Independence: Independent Functional Status:  Mobility: Bed Mobility Overal bed mobility: Needs Assistance Bed Mobility: Rolling,Sidelying to Sit Rolling: Min assist Sidelying to sit: Min assist Supine to sit: +2 for physical assistance,Mod assist,HOB elevated Sit to supine: +2 for physical assistance,Mod assist Sit to sidelying: Min assist General bed mobility comments: Min A for elevating trunk Transfers Overall transfer level: Needs assistance Equipment used: Rolling walker (2 wheeled) Transfers: Sit to/from [employer] Sit to Stand: [employer] safety/equipment Stand pivot transfers: Min assist,+2 physical assistance,+2 safety/equipment General transfer comment: Min A for  power up into  standing, Min A +2 for gaining balance Ambulation/Gait Ambulation/Gait assistance: Min assist,+2 physical assistance,+2 safety/equipment Gait Distance (Feet): 10 Feet Assistive device: Rolling walker (2 wheeled) Gait Pattern/deviations: Step-through pattern,Decreased stride length,Shuffle,Trunk flexed,Narrow base of support General Gait Details: min assist +2 for steadying, managing RW, lines/leads; VC for upright posture, placement in RW Gait velocity: decr Gait velocity interpretation: <1.31 ft/sec, indicative of household ambulator   ADL: ADL Overall ADL's : Needs assistance/impaired Eating/Feeding: NPO Grooming: Maximal assistance,Sitting Lower Body Bathing: Maximal assistance,Sit to/from stand Lower Body Bathing Details (indicate cue type and reason): Max A for washing feet Upper Body Dressing : Maximal assistance,Sitting Upper Body Dressing Details (indicate cue type and reason): Max A to don new brace while sitting at EOB Lower Body Dressing: Moderate assistance,Sit to/from stand,With adaptive equipment Lower Body Dressing Details (indicate cue type and reason): don socks with AE. Pt doffing socks with reacher. Mod A for managing sock aide. Toilet Transfer: Minimal assistance,+2 for physical assistance,+2 for safety/equipment,Ambulation,RW (simulated to recliner) Toilet Transfer Details (indicate cue type and reason): MIn A for support and RW management. Functional mobility during ADLs: Rolling walker,Cueing for safety,Minimal assistance,+2 for physical assistance,+2 for safety/equipment General ADL Comments: Pt performing functional mobility in room and then practicing LB dressing with AE   Cognition: Cognition Overall Cognitive Status: Difficult to assess Orientation Level: Oriented X4 Cognition Arousal/Alertness: Awake/alert Behavior During Therapy: WFL for tasks assessed/performed Overall Cognitive Status: Difficult to assess General Comments: Following one  step commands. Slightly flat, borderline irritable during certain tasks. Pt does make jokes occasionally during session Difficult to assess due to: Tracheostomy     Blood pressure 136/63, pulse 73, temperature 97.9 F (36.6 C), temperature source Oral, resp. rate (!) 26, height 5' 9.02" (1.753 m), weight 82.8 kg, SpO2 96 %. Physical Exam Vitals and nursing note reviewed.  Constitutional:      General: He is not in acute distress.    Comments: Congested,  productive cough --able to suction independently.   HENT:     Head: Normocephalic and atraumatic.     Comments: + NG    Right Ear: External ear normal.     Left Ear: External ear normal.     Nose: Nose normal.  Eyes:     General:        Right eye: No discharge.        Left eye: No discharge.     Extraocular Movements: Extraocular movements intact.  Neck:     Comments: Cuffed #8 trach in place with thick yellow secretions. .  Cardiovascular:     Rate and Rhythm: Normal rate and regular rhythm.  Pulmonary:     Effort: Pulmonary effort is normal. No respiratory distress.     Breath sounds: No stridor.  Abdominal:     General: Abdomen is flat. Bowel sounds are normal. There is no distension.  Musculoskeletal:     Comments: No edema or tenderness in extremities  Skin:    General: Skin is warm and dry.  Neurological:     Mental Status: He is alert.     Comments: Alert Motor: Bilateral upper extremities: 5/5 proximal distal Right lower extremity: 4+/5 proximal distally Left lower extremity: 4--4/5 proximal to distal (pain inhibition)  Psychiatric:        Mood and Affect: Mood normal.        Behavior: Behavior normal.        Lab Results Last 24 Hours       Results for orders placed or performed  during the hospital encounter of 09/10/20 (from the past 24 hour(s))  Glucose, capillary     Status: Abnormal    Collection Time: 09/26/20 11:36 AM  Result Value Ref Range    Glucose-Capillary 132 (H) 70 - 99 mg/dL  Glucose,  capillary     Status: Abnormal    Collection Time: 09/26/20  4:45 PM  Result Value Ref Range    Glucose-Capillary 127 (H) 70 - 99 mg/dL  Glucose, capillary     Status: Abnormal    Collection Time: 09/26/20  7:41 PM  Result Value Ref Range    Glucose-Capillary 144 (H) 70 - 99 mg/dL  Glucose, capillary     Status: Abnormal    Collection Time: 09/26/20 10:49 PM  Result Value Ref Range    Glucose-Capillary 129 (H) 70 - 99 mg/dL  Glucose, capillary     Status: Abnormal    Collection Time: 09/27/20  4:49 AM  Result Value Ref Range    Glucose-Capillary 166 (H) 70 - 99 mg/dL  Glucose, capillary     Status: Abnormal    Collection Time: 09/27/20  7:54 AM  Result Value Ref Range    Glucose-Capillary 109 (H) 70 - 99 mg/dL      Imaging Results (Last 48 hours)  No results found.     Assessment/Plan: Diagnosis: Debility Labs independently reviewed.  Records reviewed and summated above.   1. Does the need for close, 24 hr/day medical supervision in concert with the patient's rehab needs make it unreasonable for this patient to be served in a less intensive setting? Yes  2. Co-Morbidities requiring supervision/potential complications:  A fib (monitor heart rate with increased activity, anticoagulation), Crohn's, lung cancer s/p RSXN, thyroid cancer, prostate cancer,  T2 DM (Monitor in accordance with exercise and adjust meds as necessary), COPD (Monitor for signs of respiratory distress. Provide oxygen with goals 88 - 92% if needed. Augment therapies as needed based off endurance. 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 Due to bowel management, safety, skin/wound care, disease management, pain management and patient education, does the patient require 24 hr/day rehab nursing? Yes 4. Does the patient require coordinated care of a physician, rehab nurse, therapy disciplines of PT/OT/SLP to address  physical and functional deficits in the context of the above medical diagnosis(es)? Yes Addressing deficits in the following areas: balance, endurance, locomotion, strength, transferring, bathing, dressing, toileting, speech, swallowing and psychosocial support 5. Can the patient actively participate in an intensive therapy program of at least 3 hrs of therapy per day at least 5 days per week? Yes 6. The potential for patient to make measurable gains while on inpatient rehab is excellent 7. Anticipated functional outcomes upon discharge from inpatient rehab are supervision and min assist  with PT, supervision and min assist with OT, modified independent with SLP. 8. Estimated rehab length of stay to reach the above functional goals is: 12-16 days. 9. Anticipated discharge destination: Home 10. Overall Rehab/Functional Prognosis: excellent and good   RECOMMENDATIONS: This patient's condition is appropriate for continued rehabilitative care in the following setting: CIR Patient has agreed to participate in recommended program. Yes Note that insurance prior authorization may be required for reimbursement for recommended care.   Comment: Rehab Admissions Coordinator to follow up.   I have personally performed a face to face diagnostic evaluation, including, but not limited to relevant history and physical exam findings, of this patient and developed relevant assessment and  plan.  Additionally, I have reviewed and concur with the physician assistant's documentation above.    Devin Lesch, MD, ABPMR Devin Leriche, PA-C 09/27/2020          Revision History                             Routing History                   Note Details  Author Devin Arn, MD File Time 09/27/2020 12:05 PM  Author Type Physician Status Signed  Last Editor Devin Arn, MD Service Physical Medicine and [place] # [id number] Admit Date 10/12/2020

## 2020-10-17 NOTE — Progress Notes (Signed)
Physical Therapy Session Note  Patient Details  Name: Devin [name] MRN: [id number] Date of Birth: [birth year]  Today's Date: 10/17/2020 PT Individual Time: 1400-1515 PT Individual Time Calculation (min): 75 min   Short Term Goals: Week 1:  PT Short Term Goal 1 (Week 1): Pt will complete bed mobility consistently with minA. PT Short Term Goal 2 (Week 1): Pt will perform bed to chair transfer consistently with minA. PT Short Term Goal 3 (Week 1): Pt will ambulate x25' with minA and LRAD.  Skilled Therapeutic Interventions/Progress Updates:     Pt received supine in bed and agrees to therapy. No complaint of pain. Pt performs supine to sit with minA and cues for logrolling technique. Pt sits at EOB for several minutes prior to performing additional mobility. PT and pt's daughter provide totalA to don TLSO at EOB. Pt performs stand step transfer to WC with minA and RW. WC transport to gym for time management. PT adjusts pt's foot rests for correct length for proper ergonomics. Pt performs stand step transfer to mat table with HHA minA. Pt sits at edge of mat with focus on sitting balance and maintaining optimal posture for cardiorespiratory integrity. Pt performs sit to stand with HHA minA and cued to stand up for 1 minute. Pt is able to remain standing for ~1 minute with CGA/minA then requests for seated rest break. Pt then says "I'm done" and requests to end session due to fatigue. Pt attempts stand step transfer back to WC. While standing, pt is incontinent of urine with full bladder. Pt remains standing while urinating, then returns to sitting on mat for area to be cleaned prior to transfer. Pt then performs stand step transfer with minA. Stand step from [facility] to bed with minA. Sit to supine with cues on positioning and sequencing and CGA. Pt left supine in bed with alarm intact and all needs within reach.  Therapy Documentation Precautions:  Precautions Precautions: Fall,Back Precaution  Comments: rectal tube, trach collar, cortrak, TLSO Required Braces or Orthoses: Spinal Brace Spinal Brace: Thoracolumbosacral orthotic,Applied in sitting position Restrictions Weight Bearing Restrictions: No    Therapy/Group: Individual Therapy  Breck Coons, PT, DPT 10/17/2020, 3:36 PM

## 2020-10-17 NOTE — Progress Notes (Signed)
Patient ID: Devin [name], male   DOB: May 12, 1941, 80 y.o.   MRN: [id number]  SW met with pt, pt wife Devin [name], and pt dtr Devin [name] to provide updates from team conference, and d/c date 5/28. SW discussed d/c care needs if pt will d/c to home with trach and on TF. SW advised once all care needs are confirmed we will discuss in more detail. SW encouraged exploring HHA preference due to the care needs pt will require. Dtr reports her father will have support from other daughters and various family members that are willing to assist. SW completed assessment.  SW provided HHA from Medicare.gov based on zipcode.   Loralee Pacas, MSW, [place] Office: [phone] Cell: [phone] Fax: [phone]

## 2020-10-17 NOTE — Patient Care Conference (Signed)
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 Alger Simons Care Coodinator Present: Loralee Pacas, LCSWA;Paizleigh Wilds Creig Hines, RN, BSN, CRRN Nurse Present: Dorthula Nettles, RN PT Present: Tereasa Coop, PT OT Present: Cherylynn Ridges, OT SLP Present: Weston Anna, SLP PPS Coordinator present : Gunnar Fusi, SLP     Current Status/Progress Goal Weekly Team Focus  Bowel/Bladder   Pt unable to void in and out cath Q6h for no void. Diarrhea, flexiseal in place LBM 10/16/2020  Regain continence. D/C Flexiseal, no more diarrhea.  Assess Qshift and PRN   Swallow/Nutrition/ Hydration   NPO with trials of ice chips  Supervision  repeat mbs this week to assess function and possible diet initiation   ADL's   max/total A BADL tasks, min/mod A stand-pivots, very low endurance  Supervision  general strengthening, activity tolerance, OOB tolerance, self-care retraining   Mobility   minA bed mobility, sit to stand transfers, sidestepping at EOB. Very limited endurance and requires frequent rest breaks.  Supervision  global strengthening and endurance, increasing time OOB, initiate ambulation   Communication   Mod-Max A  Supervision  initiation of verbal expression vs gestures and increased vocal intensity to improve intelligibility    Safety/Cognition/ Behavioral Observations  Supervision-Min A  Supervision  recall and awareness   Pain   No C/O pain  Pain <3/10  Assess Qshift and PRN   Skin   Closed back incision. Stage 1 on mid lower back. Skin tear to left arm. Tracheostomy site.  Promote healing and Prevention of infection.  Assess Qshift and PRN     Discharge Planning:  PER EMR, pt to d/c to home with his wife.   Team Discussion: Fall with O70 fracture, complicated by respiratory failure, has tracheal stenosis, Crohn's disease with rectal tube in place for chronic diarrhea. Patient has urinary retention and is being scanned and cathed every 6 hr. Has occasional pain to back and buttocks. Has a Stage 1 to the back, skin tears to bilateral arms, MASD to the buttocks, trach site. Patient on target to meet rehab goals: no, Max assist for ADL's, doesn't have a lot of "get up and go," would like to have a donut for him to sit on. Min assist for bed mobility and sit to stands. Has very limited endurance and requires frequent rest breaks. SLP reports he is NPO, and has overt signs of aspiration with ice.  *See Care Plan and progress notes for long and short-term goals.   Revisions to Treatment Plan:  Not at this time.  Teaching Needs: Family education, medication management, pain management, bowel/bladder management, skin/wound care, transfer training, gait training, balance training, endurance training, safety awareness, trach care management, rectal tube management, I&O cath education.  Current Barriers to Discharge: Decreased caregiver support, Medical stability, Home enviroment access/layout, Trach, Incontinence, Wound care, Lack of/limited family support,  Weight, Medication compliance, Behavior and Nutritional means  Possible Resolutions to Barriers: Continue current medications, offer appropriate nutritional support, provide emotional support.     Medical Summary Current Status: T11 fx after fall, hx of lung  cancer, pain mgt, chronic diarrhea from Crohn's disease---rectal tube, urine retention, +trach  Barriers to Discharge: Medical stability   Possible Resolutions to [employer] Focus: mgt of trach (per ENT,pulmonary), nutrition,swallowing advancement.   Continued Need for Acute Rehabilitation Level of Care: The patient requires daily medical management by a physician with specialized training in physical medicine and rehabilitation for the following reasons: Direction of a multidisciplinary physical rehabilitation program to maximize functional independence : Yes Medical management of patient stability for increased activity during participation in an intensive rehabilitation regime.: Yes Analysis of laboratory values and/or radiology reports with any subsequent need for medication adjustment and/or medical intervention. : Yes   I attest that I was present, lead the team conference, and concur with the assessment and plan of the team.   Cristi Loron 10/17/2020, 2:59 PM

## 2020-10-17 NOTE — Progress Notes (Signed)
Occupational Therapy Session Note  Patient Details  Name: Devin [name] MRN: [id number] Date of Birth: 08/23/1940  Today's Date: 10/17/2020 OT Individual Time: 0830-0930 OT Individual Time Calculation (min): 60 min   Short Term Goals: Week 1:  OT Short Term Goal 1 (Week 1): Pt will complete bathing with mod assist at sit > stand level OT Short Term Goal 2 (Week 1): Pt will don shirt with min assist OT Short Term Goal 3 (Week 1): Pt will complete toilet transfers with mod assist with LRAD  Skilled Therapeutic Interventions/Progress Updates:    Pt greeted semi-reclined in bed and agreeable to OT treatment session. Pt agreeable to get OOB with encouragement. OT placed pt on portable O2 in hopes of getting pt to the sink. Dependent for donning TED hose and non-skid socks. Pt completed bed mobility with min A. Pt sat EOB, then performed sit<>stand with verbal cues for hand placement and min A. Min A to the pivot over to wheelchair. Rolled up cushions placed in wheelchair to act as donut for pressure relief. Rectal tube noted to be leaking. Pt brought to the sink in wc and needed rest break prior to washing face. Pt needed encouragement to reach forward and grab wash cloths to wash his face. Worked on sit<>stand while OT changed pt into clean brief. Pt completed oral care using suction toothbrush and set-up A.  Pt left seated in TIS wc awaiting next therapy session. Pt felt he could tolerate waiting 15 minutes for next PT session. Pt left with suction, call bell and needs met.   Therapy Documentation Precautions:  Precautions Precautions: Fall,Back Precaution Comments: rectal tube, trach collar, cortrak, TLSO Required Braces or Orthoses: Spinal Brace Spinal Brace: Thoracolumbosacral orthotic,Applied in sitting position Restrictions Weight Bearing Restrictions: No Pain:  denies pain  Therapy/Group: Individual Therapy  [name] 10/17/2020, 9:55 AM

## 2020-10-17 NOTE — Progress Notes (Addendum)
PROGRESS NOTE   Subjective/Complaints:  No major problems last night. Able to sleep. Clearing his secretions better.  Mild low back pain  ROS: Patient denies fever, rash, sore throat, blurred vision, nausea, vomiting, diarrhea, cough, shortness of breath or chest pain, joint or back pain, headache, or mood change.   Objective:   DG Abd 1 View  Result Date: 10/15/2020 CLINICAL DATA:  Nasogastric tube placement. EXAM: ABDOMEN - 1 VIEW COMPARISON:  Oct 09, 2020 FINDINGS: A Dobbhoff feeding tube is seen with its weighted tip overlying the expected region of the gastric antrum/duodenal bulb. The bowel gas pattern is normal. Numerous bilateral radiopaque pedicle screws are seen throughout the thoracolumbar spine. Multiple radiopaque surgical coils are seen scattered throughout the abdomen. No radio-opaque calculi or other significant radiographic abnormality are seen. IMPRESSION: Dobbhoff feeding tube positioning, as described above. Electronically Signed   By: Virgina Norfolk M.D.   On: 10/15/2020 16:19   Recent Labs    10/16/20 0550  WBC 9.8  HGB 8.4*  HCT 27.8*  PLT 286   Recent Labs    10/16/20 0550 10/17/20 0454  NA 131* 131*  K 5.2* 5.2*  CL 88* 88*  CO2 37* 37*  GLUCOSE 132* 115*  BUN 38* 41*  CREATININE 1.02 1.08  CALCIUM 9.0 9.3    Intake/Output Summary (Last 24 hours) at 10/17/2020 1018 Last data filed at 10/17/2020 0600 Gross per 24 hour  Intake 694 ml  Output 4600 ml  Net -3906 ml     Pressure Injury 09/12/20 Back Mid;Lower Stage 1 -  Intact skin with non-blanchable redness of a localized area usually over a bony prominence. Blister-like skin breakdown from patients TSLO brace (Active)  09/12/20 2000  Location: Back  Location Orientation: Mid;Lower  Staging: Stage 1 -  Intact skin with non-blanchable redness of a localized area usually over a bony prominence.  Wound Description (Comments): Blister-like skin  breakdown from patients TSLO brace  Present on Admission: Yes    Physical Exam: Vital Signs Blood pressure (!) 106/54, pulse 73, temperature 98.8 F (37.1 C), resp. rate 16, weight 90.6 kg, SpO2 93 %.     Constitutional: No distress . Vital signs reviewed. HEENT: EOMI, oral membranes moist, NGT Neck: #6 XLT Cardiovascular: RRR without murmur. No JVD    Respiratory/Chest: occasional rhonchi. Non-labored    GI/Abdomen: BS +, non-tender, non-distended Ext: no clubbing, cyanosis, or edema Psych: pleasant and cooperative Neurological: alert Skin: Clean and intact without signs of breakdown. Scattered bruises on UE's. Back incision with foam dressing, CDI, some irritation of skin laterally to right Neuro: awake, alert and appropriate.  No focal CN abnl except for weak cough. Reflexes are 1+ in all 4's.. UE grossly 3/5 prox to distal. LE: 2/5 HF, KE and 3/5 ADF/APF. Fair sitting balance. Senses LT/P in both LE's Musculoskeletal: mild LB tenderness   Assessment/Plan: 1. Functional deficits which require 3+ hours per day of interdisciplinary therapy in a comprehensive inpatient rehab setting.  Physiatrist is providing close team supervision and 24 hour management of active medical problems listed below.  Physiatrist and rehab team continue to assess barriers to discharge/monitor patient progress toward functional and medical goals  Care Tool:  Bathing    Body parts bathed by patient: Right arm,Left arm,Chest,Abdomen,Face,Right upper leg,Left upper leg   Body parts bathed by helper: Front perineal area,Buttocks,Right lower leg,Left lower leg     Bathing assist Assist Level: Moderate Assistance - Patient 50 - 74%     Upper Body Dressing/Undressing Upper body dressing   What is the patient wearing?: Hospital gown only    Upper body assist Assist Level: Moderate Assistance - Patient 50 - 74%    Lower Body Dressing/Undressing Lower body dressing      What is the patient  wearing?: Incontinence brief     Lower body assist Assist for lower body dressing: Total Assistance - Patient < 25%     Toileting Toileting    Toileting assist Assist for toileting: 2 Helpers     Transfers Chair/bed transfer  Transfers assist  Chair/bed transfer activity did not occur: Safety/medical concerns  Chair/bed transfer assist level: Minimal Assistance - Patient > 75%     Locomotion Ambulation   Ambulation assist      Assist level: Moderate Assistance - Patient 50 - 74% Assistive device: Hand held assist Max distance: 2'   Walk 10 feet activity   Assist  Walk 10 feet activity did not occur: Safety/medical concerns        Walk 50 feet activity   Assist Walk 50 feet with 2 turns activity did not occur: Safety/medical concerns         Walk 150 feet activity   Assist Walk 150 feet activity did not occur: Safety/medical concerns         Walk 10 feet on uneven surface  activity   Assist Walk 10 feet on uneven surfaces activity did not occur: Safety/medical concerns         Wheelchair     Assist Will patient use wheelchair at discharge?: No             Wheelchair 50 feet with 2 turns activity    Assist            Wheelchair 150 feet activity     Assist          Blood pressure (!) 106/54, pulse 73, temperature 98.8 F (37.1 C), resp. rate 16, weight 90.6 kg, SpO2 93 %.  Medical Problem List and Plan: 1.  T11/12 compression fx s/p posterior fusion secondary to fall with debility due to asp PNA/trach/generalzied weakness             -patient may not shower             -ELOS/Goals: 2-3 weeks- min A  -Continue CIR therapies including PT, OT, and SLP   2.  Antithrombotics: -DVT/anticoagulation:  Pharmaceutical: Other (comment)--Apixaban bid.              -antiplatelet therapy: N/A 3. Pain Management:  hydrocodone prn for severe pain  - Tramadol prn for moderate pain.  5/10 - pain controlled- con't  regimen   4. Mood/anxiety/sleep: LCSW to follow for evaluation and support.              -antipsychotic agents: N/A  -xanax bid prn anxiety  -continue trazodone for sleep 5. Neuropsych: This patient is not fully capable of making decisions on his own behalf. 6. Skin/Wound Care: Pressure relief measures.  7. Fluids/Electrolytes/Nutrition: NPO with tube feeds for nutritional support.             5/10--BUN/Cr up further, H20 flushes increased yesterday  -K+  5.2 again 5/10, unclear the source  -recheck BMET 5/11 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               --PCCM to follow for change and input on scope/decannulation in the future. Also wants ENT to help with possible tracheal stenosis?             --chest PT, sx, OOB  -vest to help with secretions  5/10 secretion clearance improved, getting them up into mouth   -will ask PCCM about potential downsize to #4 9. Acute on chronic respiratory failure: Encourage pulmonary hygiene              --continue Pulmicort nebs bid.  10.  PAF:  Monitor HR tid--continue Atenolol bid with Cardizem tid.   -HR controlled 5/9 10. Hyponatremia: Has resolved on today's labs. Continue to monitor.  11. Acute blood loss anemia, anemia chronic disease: Monitor with serial checks.   -hgb 8.4 5/6 12. Leukocytosis: WBC continues to fluctuate.   -no new signs of infection  -5/6 wbc 13.5.  5/9 wbc's down to 9.8 13. Crohn's disease:    - once able to swallow pills, can try adding home Crohn's meds to help.    -continue florastor and questran  -continue rectal tube to help prevent skin breakdown 14.  Bilateral pleural effusion: Pulmonary hygiene w/ flutter valve.              --continue to monitor for symptoms with increase in activity.   15. Aspiration PNA: zosyn completed 5/5 16. Dysphagia: Continue NPO with Glucerna               -d/w SLP potential for MbS  17. T2DM: Was on Humalog tid prior  to admission.             --will monitor BS every 4 hours as on tube feeds and use SSI prn    CBG (last 3)  Recent Labs    10/17/20 0423 10/17/20 0445 10/17/20 0804  GLUCAP 118* 120* 139*    -5/10 controlled  LOS: 5 days A FACE TO FACE EVALUATION WAS PERFORMED  Meredith Staggers 10/17/2020, 10:18 AM

## 2020-10-18 LAB — GLUCOSE, CAPILLARY
Glucose-Capillary: 132 mg/dL — ABNORMAL HIGH (ref 70–99)
Glucose-Capillary: 120 mg/dL — ABNORMAL HIGH (ref 70–99)
Glucose-Capillary: 132 mg/dL — ABNORMAL HIGH (ref 70–99)
Glucose-Capillary: 128 mg/dL — ABNORMAL HIGH (ref 70–99)
Glucose-Capillary: 144 mg/dL — ABNORMAL HIGH (ref 70–99)

## 2020-10-18 LAB — BASIC METABOLIC PANEL
Anion gap: 8 (ref 5–15)
BUN: 46 mg/dL — ABNORMAL HIGH (ref 8–23)
CO2: 33 mmol/L — ABNORMAL HIGH (ref 22–32)
Calcium: 9.1 mg/dL (ref 8.9–10.3)
Chloride: 89 mmol/L — ABNORMAL LOW (ref 98–111)
Creatinine, Ser: 1.03 mg/dL (ref 0.61–1.24)
GFR, Estimated: >60 mL/min (ref >60)
Glucose, Bld: 127 mg/dL — ABNORMAL HIGH (ref 70–99)
Potassium: 5.2 mmol/L — ABNORMAL HIGH (ref 3.5–5.1)
Sodium: 130 mmol/L — ABNORMAL LOW (ref 135–145)

## 2020-10-18 MED ORDER — FREE WATER
225.0000 mL | Status: DC
  Administered 2020-10-18 – 2020-10-30 (×74): 225 mL

## 2020-10-18 NOTE — Progress Notes (Signed)
Speech Language Pathology Daily Session Note  Patient Details  Name: Devin [name] MRN: [id number] Date of Birth: 06-13-1940  Today's Date: 10/18/2020 SLP Individual Time: 1355-1425 SLP Individual Time Calculation (min): 30 min  Short Term Goals: Week 1: SLP Short Term Goal 1 (Week 1): Patient will initiate verbal responses vs use of gestures for communication with PMSV in place in 50% of opportunities with Mod verbal cues. SLP Short Term Goal 2 (Week 1): Patient will utilize an increased vocal intensity at the phrase level to achieve ~80% intelligibility with Mod A verbal cues. SLP Short Term Goal 3 (Week 1): Patient will recall swallowing and safety precautions for trials of ice chips wth Mod A multimodal cues. SLP Short Term Goal 4 (Week 1): Patient will utilize external memory aids to recall new, daily information with supervision level verbal cues.  Skilled Therapeutic Interventions: Skilled treatment session focused on speech and cognitive goals. Upon arrival, PMSV was in place and patient appeared fatigued. Patient reported he had just received medications for his "nerves." SLP facilitated session by providing overall Mod A verbal cues for verbal initiation and use of an increased vocal intensity at the phrase level. Patient performed oral care via the suction toothbrush and consumed trials of ice chips with intermittent throat clearing and oral expectoration. Recommend patient remain NPO with repeat MBS tomorrow to assess swallow function. Patient's family present and educated regarding goals and current plan of care. Patient left upright in bed with alarm on and all needs within reach. Continue with current plan of care.      Pain No/Denies Pain   Therapy/Group: Individual Therapy  Joud Pettinato 10/18/2020, 2:59 PM

## 2020-10-18 NOTE — Progress Notes (Signed)
Occupational Therapy Session Note  Patient Details  Name: Devin [name] MRN: [id number] Date of Birth: Nov 17, 1940  Today's Date: 10/18/2020 OT Individual Time: 4287-6811 OT Individual Time Calculation (min): 46 min    Short Term Goals: Week 1:  OT Short Term Goal 1 (Week 1): Pt will complete bathing with mod assist at sit > stand level OT Short Term Goal 2 (Week 1): Pt will don shirt with min assist OT Short Term Goal 3 (Week 1): Pt will complete toilet transfers with mod assist with LRAD  Skilled Therapeutic Interventions/Progress Updates:    Pt received with RN finishing straight cath and consented to OT tx Pt's rectal tube leaking, instructed in bed mobility log roll with HOB maintaining 30*. Total A for toilet cleanup and brief donning. Pt helped to EOB with mod A. Increased time for PLB techs, line mgmt to prep for w/c transfer to increase tolerance to upright position. Pt completed SPT with CGA, however required extensive seated rest break to catch his breath. All VSS. Gave pt wash cloth to clean off face with setup. Pt reports "something isn't right." Feels lightheaded. BP, vitals stable, NT present and getting blood sugar reading. After tx, pt left comfortable in w/c with NT present, suction and call light in reach.  Therapy Documentation Precautions:  Precautions Precautions: Fall,Back Precaution Comments: rectal tube, trach collar, cortrak, TLSO Required Braces or Orthoses: Spinal Brace Spinal Brace: Thoracolumbosacral orthotic,Applied in sitting position Restrictions Weight Bearing Restrictions: No Vital Signs: Therapy Vitals Pulse Rate: 68 Resp: 16 BP: 130/62 Patient Position (if appropriate): Lying Oxygen Therapy SpO2: 94 % O2 Device: Tracheostomy Collar O2 Flow Rate (L/min): 5 L/min FiO2 (%): 21 % Pain: none     Therapy/Group: Individual Therapy  Laylah Riga 10/18/2020, 11:07 AM

## 2020-10-18 NOTE — Progress Notes (Signed)
Physical Therapy Session Note  Patient Details  Name: [name] MRN: [id number] Date of Birth: Jan 10, 1941  Today's Date: 10/18/2020 PT Missed Time: 75 Minutes Missed Time Reason: Nursing care  Short Term Goals: Week 1:  PT Short Term Goal 1 (Week 1): Pt will complete bed mobility consistently with minA. PT Short Term Goal 2 (Week 1): Pt will perform bed to chair transfer consistently with minA. PT Short Term Goal 3 (Week 1): Pt will ambulate x25' with minA and LRAD.  Skilled Therapeutic Interventions/Progress Updates:     Pt receiving meds from nurse upon PT entry. On follow-up, pt receiving in-an-out cath. PT will follow up as able.  Therapy Documentation Precautions:  Precautions Precautions: Fall,Back Precaution Comments: rectal tube, trach collar, cortrak, TLSO Required Braces or Orthoses: Spinal Brace Spinal Brace: Thoracolumbosacral orthotic,Applied in sitting position Restrictions Weight Bearing Restrictions: No General: PT Amount of Missed Time (min): 75 Minutes PT Missed Treatment Reason: Nursing care   Therapy/Group: Individual Therapy  Breck Coons, PT, DPT 10/18/2020, 9:07 AM

## 2020-10-18 NOTE — Progress Notes (Signed)
Occupational Therapy Session Note  Patient Details  Name: Devin [name] MRN: [id number] Date of Birth: Jan 22, 1941  Today's Date: 10/18/2020 OT Individual Time: 1435-1500 OT Individual Time Calculation (min): 25 min    Short Term Goals: Week 1:  OT Short Term Goal 1 (Week 1): Pt will complete bathing with mod assist at sit > stand level OT Short Term Goal 2 (Week 1): Pt will don shirt with min assist OT Short Term Goal 3 (Week 1): Pt will complete toilet transfers with mod assist with LRAD  Skilled Therapeutic Interventions/Progress Updates:    pt received supine with his family arriving shortly after OT. Pt agreeable to come EOB despite fatigue. Pt completed bed mobility with min A. Once EOB he was able to maintain sitting balance with supervision overall. Inconsistent pulse oximeter readings but once a new one was applied he read 94% SpO2 on 28% FiO2. Pt completed grooming tasks EOB with set up assist. He stood with RW with min A. Pt completed side stepping to the [facility] with min A. He returned to supine and was left with all needs met, NT attending to needs.   Therapy Documentation Precautions:  Precautions Precautions: Fall,Back Precaution Comments: rectal tube, trach collar, cortrak, TLSO Required Braces or Orthoses: Spinal Brace Spinal Brace: Thoracolumbosacral orthotic,Applied in sitting position Restrictions Weight Bearing Restrictions: No   Therapy/Group: Individual Therapy  Curtis Sites 10/18/2020, 6:31 AM

## 2020-10-18 NOTE — Progress Notes (Signed)
Inpatient Rehabilitation Care Coordinator Assessment and Plan Patient Details  Name: Devin [name] MRN: [id number] Date of Birth: [birth year]  Today's Date: 10/18/2020  Hospital Problems: Principal Problem:   Thoracic compression fracture [facility] Active Problems:   Tracheostomy dependence ([place])   Debility  Past Medical History:  Past Medical History:  Diagnosis Date  . A-fib ([place])   . Adenocarcinoma of left lung, stage 1 ([place]) 03/10/2015  . Ankylosing spondylitis ([place])    Diagnosed during lumbar fracture summer of 2016    . Crohn's disease ([place])   . Gout   . HIstory of basal cell cancer of face    THYROID CA HX  . History of kidney stones   . Hypertension   . Hypothyroidism   . Impotence   . 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 Obesity (BMI 30-39.9)   . Osteoporosis    Pt completed 5 years of fosamax in 2013     . Prostate cancer with recurrence    Treated with prostatectomy with recurrence 2012 with Lupron treatment now him   . Psoriasis   . Thyroid cancer ([place]) 1999   Treated with RAI and total thyroidectomy   . Type II diabetes mellitus ([place])    Past Surgical History:  Past Surgical History:  Procedure Laterality Date  . ABDOMINAL EXPLORATION SURGERY     for small bowel obstruction  . APPENDECTOMY  03/2007  . BACK SURGERY    . BASAL CELL CARCINOMA EXCISION  "several"   "head"  . CARDIAC CATHETERIZATION  03/17/2003  . CHOLECYSTECTOMY N/A 05/07/2016   Procedure: LAPAROSCOPIC CHOLECYSTECTOMY;  Surgeon: Fanny Skates, MD;  Location: [place];  Service: General;  Laterality: N/A;  . COLON SURGERY  03/2007   Resection of cecum, appendix, terminal ileum (approximately/notes 10/10/2010  . CYSTOSCOPY/URETEROSCOPY/HOLMIUM LASER/STENT PLACEMENT Right 07/03/2020   Procedure: CYSTOSCOPY/RETROGRADE/URETEROSCOPY REMOVAL OF BLADDER STONE;  Surgeon: Raynelle Bring, MD;  Location: [facility];  Service: Urology;  Laterality: Right;  . DIRECT LARYNGOSCOPY N/A  10/04/2020   Procedure: DIRECT LARYNGOSCOPY, PHARYNGOSCOPY WITH BIOPSY;  Surgeon: [name], DO;  Location: [place];  Service: ENT;  Laterality: N/A;  . ESOPHAGOSCOPY N/A 10/04/2020   Procedure: ESOPHAGOSCOPY WITH BIOPSY;  Surgeon: [name], DO;  Location: [place];  Service: ENT;  Laterality: N/A;  . HERNIA REPAIR    . LAMINECTOMY WITH POSTERIOR LATERAL ARTHRODESIS LEVEL 1 N/A 09/15/2020   Procedure: REVISION OF THORACOLUMBAR FUSION, ADDITION OF CROSS-CONNECTORS;  Surgeon: Eustace Moore, MD;  Location: [place];  Service: Neurosurgery;  Laterality: N/A;  . LAPAROSCOPIC CHOLECYSTECTOMY  05/07/2016  . LAPAROSCOPIC LYSIS OF ADHESIONS  05/07/2016  . LAPAROSCOPIC LYSIS OF ADHESIONS N/A 05/07/2016   Procedure: LAPAROSCOPIC LYSIS OF ADHESIONS TIMES ONE HOUR;  Surgeon: Fanny Skates, MD;  Location: [place];  Service: General;  Laterality: N/A;  . POSTERIOR FUSION THORACIC SPINE  02/08/2016   1. Posterior thoracic arthrodesis T7-T11 utilizing morcellized allograft, 2. Posterior thoracic segmental fixation T7-T11 utilizing nuvasive pedicle screws  . PROSTATECTOMY  06/2001   w/bilateral pelvic lymph nose dissection/notes 10/24/2010  . SPINAL FUSION  12/2014   Open reduction internal fixation of L1 Chance fracture with posterior fusion T10-L4 utilizing morcellized allograft and some local autograft, segmental instrumentation T10-L4 inclusive utilizing nuvasive pedicle screws/notes 12/16/2014  . Stress Cardiolite  02/17/2003  . THOROCOTOMY WITH LOBECTOMY  03/16/2015   Procedure: THOROCOTOMY WITH LOBECTOMY;  Surgeon: Ivin Poot, MD;  Location: [place];  Service: Thoracic;;  . TONSILLECTOMY    .  TOTAL THYROIDECTOMY  1997  . Venous Doppler  05/30/2004  . VENTRAL HERNIA REPAIR  04/14/2008  . VIDEO ASSISTED THORACOSCOPY Left 03/16/2015   Procedure: VIDEO ASSISTED THORACOSCOPY;  Surgeon: Ivin Poot, MD;  Location: [place];  Service: Thoracic;  Laterality: Left;   Social History:  reports that he  quit smoking about 30 years ago. His smoking use included cigarettes. He has a 60.00 pack-year smoking history. He has never used smokeless tobacco. He reports previous alcohol use. He reports that he does not use drugs.  Family / Support Systems Marital Status: Married How Long?: 78 years Patient Roles: Spouse Children: 3 adult dtrs: Gaynelle Adu, and Manuela Schwartz (lives in [place]) Other Supports: none reported Anticipated Caregiver: wife and dtrs: Maudie Mercury and Debbie Ability/Limitations of Caregiver: Daughters work and will rotate their work schedule to help assist Caregiver Availability: 24/7 Family Dynamics: Pt lives with his wife.  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 Education: 2 years of college Read: Yes Write: Yes Employment Status: Retired Date Retired/Disabled/Unemployed: 2012 Age Retired: 1 Public relations account executive Issues: Denies Guardian/Conservator: N/A   Abuse/Neglect Abuse/Neglect Assessment Can Be Completed: Yes Physical Abuse: Denies Verbal Abuse: Denies Sexual Abuse: Denies Exploitation of patient/patient's resources: Denies Self-Neglect: Denies  Emotional Status Pt's affect, behavior and adjustment status: Pt in good spirits at time of visit Recent Psychosocial Issues: Situational anxiety due to current health state Psychiatric History: Denies any hx Substance Abuse History: Denies. Pt quit smoking cigarettes 20 years ago  Patient / Family Perceptions, Expectations & Goals Pt/Family understanding of illness & functional limitations: Pt and family have a general understanding of care needs. Premorbid pt/family roles/activities: Independent Anticipated changes in roles/activities/participation: Assistance with ADLs/IADLs  Community Resources Express Scripts: None Premorbid Home Care/DME Agencies: None Transportation available at discharge:  Family Resource referrals recommended: Neuropsychology  Discharge Planning Living Arrangements: Spouse/significant other Support Systems: Spouse/significant other,Children Type of Residence: Private residence Insurance Resources: Multimedia programmer (specify) (UHC Medicare and covers Rx-pharmacy is [facility] on [address]) Museum/gallery curator Resources: Social Chief Technology Officer (Comment) (savings/retirement) Financial Screen Referred: No Living Expenses: Own Money Management: Spouse Does the patient have any problems obtaining your medications?: No Home Management: Pt wife managed housekeeping, bills, and cooking. Patient/Family Preliminary Plans: NO changes Care Coordinator Anticipated Follow Up Needs: HH/OP Expected length of stay: D/c date 5/28  Clinical Impression SW met with pt, pt wife, and pt dtr Debbie in room to introduce self, explain role, and discuss discharge process. Pt has HCPOA. Order of [place]- wife Loralee Pacas (dtr) [phone], Maudie Mercury and Manuela Schwartz. DME: RW.   Rana Snare 10/18/2020, 2:15 PM

## 2020-10-18 NOTE — Progress Notes (Signed)
PROGRESS NOTE   Subjective/Complaints:  Pt slept fairly well. No respiratory issues overnight.   ROS: Limited due to cognitive/behavioral      Objective:   No results found. Recent Labs    10/16/20 0550  WBC 9.8  HGB 8.4*  HCT 27.8*  PLT 286   Recent Labs    10/17/20 0454 10/18/20 0412  NA 131* 130*  K 5.2* 5.2*  CL 88* 89*  CO2 37* 33*  GLUCOSE 115* 127*  BUN 41* 46*  CREATININE 1.08 1.03  CALCIUM 9.3 9.1    Intake/Output Summary (Last 24 hours) at 10/18/2020 0842 Last data filed at 10/18/2020 0152 Gross per 24 hour  Intake --  Output 14941 ml  Net -14941 ml     Pressure Injury 09/12/20 Back Mid;Lower Stage 1 -  Intact skin with non-blanchable redness of a localized area usually over a bony prominence. Blister-like skin breakdown from patients TSLO brace (Active)  09/12/20 2000  Location: Back  Location Orientation: Mid;Lower  Staging: Stage 1 -  Intact skin with non-blanchable redness of a localized area usually over a bony prominence.  Wound Description (Comments): Blister-like skin breakdown from patients TSLO brace  Present on Admission: Yes    Physical Exam: Vital Signs Blood pressure 130/62, pulse 68, temperature 98.8 F (37.1 C), temperature source Oral, resp. rate 16, weight 90.6 kg, SpO2 94 %.     Constitutional: No distress . Vital signs reviewed. HEENT: EOMI, oral membranes moist Neck: supple Cardiovascular: RRR without murmur. No JVD    Respiratory/Chest: CTA Bilaterally without wheezes or rales. Normal effort    GI/Abdomen: BS +, non-tender, non-distended Ext: no clubbing, cyanosis, or edema Psych: pleasant and cooperative Skin:  Scattered bruises on UE's. Back incision with foam dressing and is CDI, some irritation of skin laterally to right of incision Neuro: awake, alert and appropriate.  No focal CN abnl except for weak cough. Reflexes are 1+ in all 4's.. UE grossly 3/5 prox  to distal. LE: 2/5 HF, KE and 3/5 ADF/APF without motor changes today. Senses LT/P in both LE's Musculoskeletal: mild LB tenderness   Assessment/Plan: 1. Functional deficits which require 3+ hours per day of interdisciplinary therapy in a comprehensive inpatient rehab setting.  Physiatrist is providing close team supervision and 24 hour management of active medical problems listed below.  Physiatrist and rehab team continue to assess barriers to discharge/monitor patient progress toward functional and medical goals  Care Tool:  Bathing    Body parts bathed by patient: Right arm,Left arm,Chest,Abdomen,Face,Right upper leg,Left upper leg   Body parts bathed by helper: Front perineal area,Buttocks,Right lower leg,Left lower leg     Bathing assist Assist Level: Moderate Assistance - Patient 50 - 74%     Upper Body Dressing/Undressing Upper body dressing   What is the patient wearing?: Hospital gown only    Upper body assist Assist Level: Moderate Assistance - Patient 50 - 74%    Lower Body Dressing/Undressing Lower body dressing      What is the patient wearing?: Incontinence brief     Lower body assist Assist for lower body dressing: Total Assistance - Patient < 25%     Toileting Toileting  Toileting assist Assist for toileting: 2 Helpers     Transfers Chair/bed transfer  Transfers assist  Chair/bed transfer activity did not occur: Safety/medical concerns  Chair/bed transfer assist level: Minimal Assistance - Patient > 75%     Locomotion Ambulation   Ambulation assist      Assist level: Moderate Assistance - Patient 50 - 74% Assistive device: Hand held assist Max distance: 2'   Walk 10 feet activity   Assist  Walk 10 feet activity did not occur: Safety/medical concerns        Walk 50 feet activity   Assist Walk 50 feet with 2 turns activity did not occur: Safety/medical concerns         Walk 150 feet activity   Assist Walk 150 feet  activity did not occur: Safety/medical concerns         Walk 10 feet on uneven surface  activity   Assist Walk 10 feet on uneven surfaces activity did not occur: Safety/medical concerns         Wheelchair     Assist Will patient use wheelchair at discharge?: No             Wheelchair 50 feet with 2 turns activity    Assist            Wheelchair 150 feet activity     Assist          Blood pressure 130/62, pulse 68, temperature 98.8 F (37.1 C), temperature source Oral, resp. rate 16, weight 90.6 kg, SpO2 94 %.  Medical Problem List and Plan: 1.  T11/12 compression fx s/p posterior fusion secondary to fall with debility due to asp PNA/trach/generalzied weakness             -patient may not shower             -ELOS/Goals: 2-3 weeks- min A  -Continue CIR therapies including PT, OT, and SLP    2.  Antithrombotics: -DVT/anticoagulation:  Pharmaceutical: Other (comment)--Apixaban bid.              -antiplatelet therapy: N/A 3. Pain Management:  hydrocodone prn for severe pain  - Tramadol prn for moderate pain.  5/10 - pain controlled- con't regimen   4. Mood/anxiety/sleep: LCSW to follow for evaluation and support.              -antipsychotic agents: N/A  -xanax bid prn anxiety  -continue trazodone for sleep 5. Neuropsych: This patient is not fully capable of making decisions on his own behalf. 6. Skin/Wound Care: Pressure relief measures.  7. Fluids/Electrolytes/Nutrition: NPO with tube feeds for nutritional support.             5/11--BUN/Cr up further --increase flushes   -K+ 5.2 again 5/10 and 5/11   -will ask RD re: TF to see if there is something with less potassium that we could try. 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               --?inflammatory subglottic mucosal, negative for malignancy             --chest PT, sx, OOB  -vest to help with secretions  5/11 secretions  better. PCCM following   -?downsize week of 5/23 to coincide with f/u bronch 9. Acute on chronic respiratory failure: Encourage pulmonary hygiene              --continue Pulmicort nebs bid.  10.  PAF:  Monitor  HR tid--continue Atenolol bid with Cardizem tid.   -HR controlled 5/9 10. Hyponatremia: Has resolved on today's labs. Continue to monitor.  11. Acute blood loss anemia, anemia chronic disease: Monitor with serial checks.   -hgb 8.4 5/6 12. Leukocytosis: WBC continues to fluctuate.   -no new signs of infection  -5/6 wbc 13.5.  5/9 wbc's down to 9.8 13. Crohn's disease:    - once able to swallow pills, can try adding home Crohn's meds to help.    -continue florastor and questran  -continue rectal tube to help prevent skin breakdown 14.  Bilateral pleural effusion: Pulmonary hygiene w/ flutter valve.              --continue to monitor for symptoms with increase in activity.   15. Aspiration PNA: zosyn completed 5/5 16. Dysphagia: Continue NPO with Glucerna               -MBS 5/12 per SLP to assess for interval improvement  -may need to consider PEG 17. T2DM: Was on Humalog tid prior to admission.             --will monitor BS every 4 hours as on tube feeds and use SSI prn    CBG (last 3)  Recent Labs    10/17/20 2128 10/17/20 2311 10/18/20 0505  GLUCAP 156* 160* 132*    -5/11 controlled  LOS: 6 days A FACE TO FACE EVALUATION WAS PERFORMED  Devin [name] 10/18/2020, 8:42 AM

## 2020-10-19 ENCOUNTER — Inpatient Hospital Stay (HOSPITAL_COMMUNITY): Payer: Medicare Other

## 2020-10-19 LAB — BASIC METABOLIC PANEL
Anion gap: 5 (ref 5–15)
BUN: 48 mg/dL — ABNORMAL HIGH (ref 8–23)
CO2: 35 mmol/L — ABNORMAL HIGH (ref 22–32)
Calcium: 8.8 mg/dL — ABNORMAL LOW (ref 8.9–10.3)
Chloride: 89 mmol/L — ABNORMAL LOW (ref 98–111)
Creatinine, Ser: 1.1 mg/dL (ref 0.61–1.24)
GFR, Estimated: >60 mL/min (ref >60)
Glucose, Bld: 117 mg/dL — ABNORMAL HIGH (ref 70–99)
Potassium: 5.1 mmol/L (ref 3.5–5.1)
Sodium: 129 mmol/L — ABNORMAL LOW (ref 135–145)

## 2020-10-19 LAB — GLUCOSE, CAPILLARY
Glucose-Capillary: 121 mg/dL — ABNORMAL HIGH (ref 70–99)
Glucose-Capillary: 126 mg/dL — ABNORMAL HIGH (ref 70–99)
Glucose-Capillary: 131 mg/dL — ABNORMAL HIGH (ref 70–99)
Glucose-Capillary: 138 mg/dL — ABNORMAL HIGH (ref 70–99)
Glucose-Capillary: 155 mg/dL — ABNORMAL HIGH (ref 70–99)
Glucose-Capillary: 171 mg/dL — ABNORMAL HIGH (ref 70–99)
Glucose-Capillary: 177 mg/dL — ABNORMAL HIGH (ref 70–99)
Glucose-Capillary: 192 mg/dL — ABNORMAL HIGH (ref 70–99)

## 2020-10-19 MED ORDER — CHOLESTYRAMINE 4 G PO PACK
4.0000 g | PACK | Freq: Four times a day (QID) | ORAL | Status: DC
  Administered 2020-10-19 – 2020-10-30 (×41): 4 g
  Filled 2020-10-19 (×51): qty 1

## 2020-10-19 MED ORDER — PROSOURCE TF PO LIQD
45.0000 mL | Freq: Two times a day (BID) | ORAL | Status: DC
  Administered 2020-10-19 – 2020-10-30 (×24): 45 mL
  Filled 2020-10-19 (×23): qty 45

## 2020-10-19 MED ORDER — OSMOLITE 1.5 CAL PO LIQD
1000.0000 mL | ORAL | Status: DC
  Administered 2020-10-19 – 2020-10-30 (×11): 1000 mL
  Filled 2020-10-19 (×10): qty 1000

## 2020-10-19 NOTE — Progress Notes (Signed)
Patient ID: Devin [name], male   DOB: June 19, 1940, 80 y.o.   MRN: [id number]   SW met with pt and pt wife in room to discuss HHA preference. Preferred agency is [facility].   SW sent HHPT/OT/SLP/SN/aide referral to [facility] and waiting on follow-up.    Loralee Pacas, MSW, [place] Office: [phone] Cell: [phone] Fax: [phone]

## 2020-10-19 NOTE — Progress Notes (Signed)
Modified Barium Swallow Progress Note  Patient Details  Name: Devin [name] MRN: [id number] Date of Birth: Oct 21, 1940  Today's Date: 10/19/2020  Modified Barium Swallow completed.  Full report located under Chart Review in the Imaging Section.  Brief recommendations include the following:  Clinical Impression  Patient demonstrates minimal functional change since initial MBS on 09/29/20. Patient continues to present with a severe pharyngeal dysphagia that appears related to a thickening of the preverterbral tissue at the level of C4-C5.  This results in decreased epiglottic closure and redirects the bolus into the laryngeal vestibule resulting in penetration and eventual aspiration. With PMV in place and extra time and effort, the patient had a moderately strong cough response and ejected most of the barium effectively; however, it tended to fall immediately back into the larynx. Postural changes were not effective in reducing penetration or aspiration.  Discussed results with Dr. Naaman Plummer. Recommend continue NPO and suspect need for long-term alternative means of nutrition.  SLP will continue to follow.   Swallow Evaluation Recommendations       SLP Diet Recommendations: NPO;Alternative means - long-term       Medication Administration: Via alternative means               Oral Care Recommendations: Oral care QID        Aldrick Derrig 10/19/2020,9:59 AM

## 2020-10-19 NOTE — Progress Notes (Signed)
Nutrition Follow-up  DOCUMENTATION CODES:   Non-severe (moderate) malnutrition in context of chronic illness  INTERVENTION:   Continue tube feeding via small-bore NG tube: - Change to Osmolite 1.5 @ 75 ml/hr (tube feeds can be held for up to 4 hours daily for therapies) - ProSource TF 45 ml BID - Free water flushes per MD/PA, currently 225 ml q 4 hours  Tube feeding regimen provides 2330 kcal, 116 grams of protein, and 1143 ml of H2O.  Total free water with flushes: 2493 ml  - 1 packet Juven BID per tube, each packet provides 95 calories, 2.5 grams of protein, and 9.8 grams of carbohydrate; also contains L-arginine and L-glutamine, vitamin C, vitamin E, vitamin B-12, zinc, calcium, and calcium Beta-hydroxy-Beta-methylbutyrate to support wound healing  NUTRITION DIAGNOSIS:   Moderate Malnutrition related to chronic illness (Crohn's disease, lung cancer) as evidenced by moderate fat depletion,severe muscle depletion.  Ongoing, being addressed via TF  GOAL:   Patient will meet greater than or equal to 90% of their needs  Met via TF  MONITOR:   Diet advancement,Labs,Weight trends,TF tolerance,Skin,I & O's  REASON FOR ASSESSMENT:   Consult Enteral/tube feeding initiation and management  ASSESSMENT:   80 year old male with PMH of HTN, T2DM, Crohn's disease, atrial fibrillation, lung cancer s/p LUL lobectomy, ankylosing spondylosis who sustained a fall on 09/07/20 with progressive pain. Pt was admitted on 09/10/20 with acute on chronic hypercarbic respiratory failure, T11 Chance fracture, and agitation. Pt underwent posterior thoracic fusion T11-T12 on 4/8. Pt ultimately required tracheostomy on 4/12. Pt was initially admitted to CIR on 4/29. On 4/30, Pt had pulled out his feeding tube and NG tube was extremely difficult to place and tube feeds were held with subsequent hypoglycemic episodes. There was concern for sepsis and pt was transferred to acute care for management. Pt  readmitted to CIR on 5/5.  5/12 - MBS with recommendations to continue NPO and consider long-term enteral access  Per MD note, pt will likely need PEG. MD to discuss with pt and wife.  RD consulted to adjust TF due to hyperkalemia. Will trial Osmolite 1.5 formula instead of Glucerna 1.5 and follow potassium. MD aware that pt's CBG's may be more elevated due to change in TF formula.  Spoke with pt at bedside. Pt reports that his stomach is growling and that he is feeling hungry. Pt disconnected from TF at this time. Discussed with RN who is going to reconnect pt's TF since he has a break in therapy.  Pt continues to have diarrhea (chronic in nature) with rectal tube in place. MD increasing questran to QID.  Noted weight decreased from 90.6 kg on 5/09 to 80.6 kg on 5/12. RD obtained updated bed weight at time of visit: 91.7 kg. RD added this to pt's chart. Suspect weight of 80.6 kg was inaccurate.  Medications reviewed and include: questran, SSI q 4 hours, imodium, Juven BID, florastor  Labs reviewed: sodium 129, potassium 5.1, BUN 48 CBG's: 121-155 x 24 hours  UOP: 2900 ml x 24 hours  Diet Order:   Diet Order            Diet NPO time specified  Diet effective now                 EDUCATION NEEDS:   Education needs have been addressed  Skin:  Skin Assessment:  Skin Integrity Issues: Stage I: back Incisions: back, lip Other: skin tears to L hand, R forearm, L arm  Last BM:  10/18/20 rectal tube  Height:   Ht Readings from Last 1 Encounters:  09/10/20 5' 9.02" (1.753 m)    Weight:   Wt Readings from Last 1 Encounters:  10/19/20 91.7 kg    BMI:  Body mass index is 29.84 kg/m.  Estimated Nutritional Needs:   Kcal:  2000-2200  Protein:  100-120 grams  Fluid:  >/= 2.0 L    Gustavus Bryant, MS, RD, LDN Inpatient Clinical Dietitian Please see [facility] for contact information.

## 2020-10-19 NOTE — Progress Notes (Signed)
Occupational Therapy Session Note  Patient Details  Name: Devin [name] MRN: [id number] Date of Birth: January 23, 1941  Today's Date: 10/19/2020 OT Individual Time: 1500-1535 OT Individual Time Calculation (min): 35 min    Short Term Goals: Week 1:  OT Short Term Goal 1 (Week 1): Pt will complete bathing with mod assist at sit > stand level OT Short Term Goal 2 (Week 1): Pt will don shirt with min assist OT Short Term Goal 3 (Week 1): Pt will complete toilet transfers with mod assist with LRAD  Skilled Therapeutic Interventions/Progress Updates:    Pt in bed with his daughter present in the room, agreeable to participate in therapy.  He was able to state 2/3 back precautions, but unable to recall lifting precaution.  Min assist for transition to sitting from supine with HOB elevated to 30 degrees.  Therapist assisted with donning his TLSO with total assist on the EOB.  He then successfully completed 3 intervals of sit to stand from the bed with mod assist using the RW for support.  He completed standing intervals of up to 100 seconds with min assist while completing marching in place for 10 repetitions on each LE.  Oxygen sats throughout session at 92% or greater on trach collar at 28% 4Ls. He transitioned back to the bed with mod assist for lifting both LEs into the bed following his back precautions.  Call button and phone in reach with safety alarm in place.     Therapy Documentation Precautions:  Precautions Precautions: Fall,Back Precaution Booklet Issued: No Precaution Comments: rectal tube, trach collar, cortrak, TLSO Required Braces or Orthoses: Spinal Brace Spinal Brace: Thoracolumbosacral orthotic,Applied in sitting position Restrictions Weight Bearing Restrictions: No  Pain: Pain Assessment Pain Scale: 0-10 Pain Score: 4  Pain Type: Surgical pain Pain Location: Back Pain Orientation: Lower Pain Descriptors / Indicators: Discomfort Pain Onset: With Activity Pain  Intervention(s): Repositioned ADL: See Care Tool Section for some details of mobility and selfcare  Therapy/Group: Individual Therapy  Laiba Fuerte OTR/L 10/19/2020, 4:46 PM

## 2020-10-19 NOTE — Progress Notes (Signed)
PROGRESS NOTE   Subjective/Complaints:  Pt slept fairly well. No respiratory issues overnight.   ROS: Limited due to cognitive/behavioral      Objective:   DG Swallowing Func-Speech Pathology  Result Date: 10/19/2020 Objective Swallowing Evaluation: Type of Study: MBS-Modified Barium Swallow Study  Patient Details Name: Devin [name] MRN: [id number] Date of Birth: 1940-10-10 Today's Date: 10/19/2020 Past Medical History: Past Medical History: Diagnosis Date . A-fib ([place])  . Adenocarcinoma of left lung, stage 1 ([place]) 03/10/2015 . Ankylosing spondylitis ([place])   Diagnosed during lumbar fracture summer of 2016   . Crohn's disease ([place])  . Gout  . HIstory of basal cell cancer of face   THYROID CA HX . History of kidney stones  . Hypertension  . Hypothyroidism  . Impotence  . 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 Obesity (BMI 30-39.9)  . Osteoporosis   Pt completed 5 years of fosamax in 2013    . Prostate cancer with recurrence   Treated with prostatectomy with recurrence 2012 with Lupron treatment now him  . Psoriasis  . Thyroid cancer ([place]) 1999  Treated with RAI and total thyroidectomy  . Type II diabetes mellitus ([place])  Past Surgical History: Past Surgical History: Procedure Laterality Date . ABDOMINAL EXPLORATION SURGERY    for small bowel obstruction . APPENDECTOMY  03/2007 . BACK SURGERY   . BASAL CELL CARCINOMA EXCISION  "several"  "head" . CARDIAC CATHETERIZATION  03/17/2003 . CHOLECYSTECTOMY N/A 05/07/2016  Procedure: LAPAROSCOPIC CHOLECYSTECTOMY;  Surgeon: Fanny Skates, MD;  Location: [place];  Service: General;  Laterality: N/A; . COLON SURGERY  03/2007  Resection of cecum, appendix, terminal ileum (approximately/notes 10/10/2010 . CYSTOSCOPY/URETEROSCOPY/HOLMIUM LASER/STENT PLACEMENT Right 07/03/2020  Procedure: CYSTOSCOPY/RETROGRADE/URETEROSCOPY REMOVAL OF BLADDER STONE;  Surgeon: Raynelle Bring, MD;  Location: [facility];  Service: Urology;  Laterality: Right; . DIRECT LARYNGOSCOPY N/A 10/04/2020  Procedure: DIRECT LARYNGOSCOPY, PHARYNGOSCOPY WITH BIOPSY;  Surgeon: [name], DO;  Location: [place];  Service: ENT;  Laterality: N/A; . ESOPHAGOSCOPY N/A 10/04/2020  Procedure: ESOPHAGOSCOPY WITH BIOPSY;  Surgeon: [name], DO;  Location: [place];  Service: ENT;  Laterality: N/A; . HERNIA REPAIR   . LAMINECTOMY WITH POSTERIOR LATERAL ARTHRODESIS LEVEL 1 N/A 09/15/2020  Procedure: REVISION OF THORACOLUMBAR FUSION, ADDITION OF CROSS-CONNECTORS;  Surgeon: Eustace Moore, MD;  Location: [place];  Service: Neurosurgery;  Laterality: N/A; . LAPAROSCOPIC CHOLECYSTECTOMY  05/07/2016 . LAPAROSCOPIC LYSIS OF ADHESIONS  05/07/2016 . LAPAROSCOPIC LYSIS OF ADHESIONS N/A 05/07/2016  Procedure: LAPAROSCOPIC LYSIS OF ADHESIONS TIMES ONE HOUR;  Surgeon: Fanny Skates, MD;  Location: [place];  Service: General;  Laterality: N/A; . POSTERIOR FUSION THORACIC SPINE  02/08/2016  1. Posterior thoracic arthrodesis T7-T11 utilizing morcellized allograft, 2. Posterior thoracic segmental fixation T7-T11 utilizing nuvasive pedicle screws . PROSTATECTOMY  06/2001  w/bilateral pelvic lymph nose dissection/notes 10/24/2010 . SPINAL FUSION  12/2014  Open reduction internal fixation of L1 Chance fracture with posterior fusion T10-L4 utilizing morcellized allograft and some local autograft, segmental instrumentation T10-L4 inclusive utilizing nuvasive pedicle screws/notes 12/16/2014 . Stress Cardiolite  02/17/2003 . THOROCOTOMY WITH LOBECTOMY  03/16/2015  Procedure: THOROCOTOMY WITH LOBECTOMY;  Surgeon: Ivin Poot, MD;  Location: [facility];  Service: Thoracic;; . TONSILLECTOMY   . TOTAL THYROIDECTOMY  1997 . Venous Doppler  05/30/2004 . VENTRAL HERNIA REPAIR  04/14/2008 . VIDEO ASSISTED THORACOSCOPY Left 03/16/2015  Procedure: VIDEO ASSISTED THORACOSCOPY;  Surgeon: Ivin Poot, MD;  Location: [place];  Service: Thoracic;  Laterality: Left; HPI: See H&P   Subjective: alert Assessment / Plan / Recommendation CHL IP CLINICAL IMPRESSIONS 10/19/2020 Clinical Impression Patient demonstrates minimal functional change since initial MBS on 09/29/20. Patient continues to present with a severe pharyngeal dysphagia that appears related to a thickening of the preverterbral tissue at the level of C4-C5.  This results in decreased epiglottic closure and redirects the bolus into the laryngeal vestibule resulting in penetration and eventual aspiration. With PMV in place and extra time and effort, the patient had a moderately strong cough response and ejected most of the barium effectively; however, it tended to fall immediately back into the larynx. Postural changes were not effective in reducing penetration or aspiration.  Discussed results with Dr. Naaman Plummer. Recommend continue NPO and suspect need for long-term alternative means of nutrition.  SLP will continue to follow. SLP Visit Diagnosis Dysphagia, oropharyngeal phase (R13.12) Attention and concentration deficit following -- Frontal lobe and executive function deficit following -- Impact on safety and function Severe aspiration risk   CHL IP TREATMENT RECOMMENDATION 10/19/2020 Treatment Recommendations Therapy as outlined in treatment plan below   Prognosis 10/19/2020 Prognosis for Safe Diet Advancement Fair Barriers to Reach Goals -- Barriers/Prognosis Comment -- CHL IP DIET RECOMMENDATION 10/19/2020 SLP Diet Recommendations NPO;Alternative means - long-term Liquid Administration via -- Medication Administration Via alternative means Compensations -- Postural Changes --   CHL IP OTHER RECOMMENDATIONS 10/19/2020 Recommended Consults -- Oral Care Recommendations Oral care QID Other Recommendations --   CHL IP FOLLOW UP RECOMMENDATIONS 10/19/2020 Follow up Recommendations Inpatient Rehab   CHL IP FREQUENCY AND DURATION 10/19/2020 Speech Therapy Frequency (ACUTE ONLY) min 3x week Treatment Duration 3 weeks      CHL IP ORAL PHASE 10/19/2020  Oral Phase Impaired Oral - Pudding Teaspoon -- Oral - Pudding Cup -- Oral - Honey Teaspoon -- Oral - Honey Cup -- Oral - Nectar Teaspoon Holding of bolus;Delayed oral transit Oral - Nectar Cup -- Oral - Nectar Straw -- Oral - Thin Teaspoon -- Oral - Thin Cup -- Oral - Thin Straw -- Oral - Puree -- Oral - Mech Soft -- Oral - Regular -- Oral - Multi-Consistency -- Oral - Pill -- Oral Phase - Comment --  CHL IP PHARYNGEAL PHASE 10/19/2020 Pharyngeal Phase Impaired Pharyngeal- Pudding Teaspoon -- Pharyngeal -- Pharyngeal- Pudding Cup -- Pharyngeal -- Pharyngeal- Honey Teaspoon -- Pharyngeal -- Pharyngeal- Honey Cup -- Pharyngeal -- Pharyngeal- Nectar Teaspoon Delayed swallow initiation-vallecula;Reduced epiglottic inversion;Reduced airway/laryngeal closure;Penetration/Aspiration during swallow;Penetration/Apiration after swallow;Moderate aspiration;Pharyngeal residue - pyriform;Pharyngeal residue - valleculae Pharyngeal -- Pharyngeal- Nectar Cup -- Pharyngeal -- Pharyngeal- Nectar Straw -- Pharyngeal -- Pharyngeal- Thin Teaspoon -- Pharyngeal -- Pharyngeal- Thin Cup -- Pharyngeal -- Pharyngeal- Thin Straw -- Pharyngeal -- Pharyngeal- Puree NT Pharyngeal -- Pharyngeal- Mechanical Soft -- Pharyngeal -- Pharyngeal- Regular -- Pharyngeal -- Pharyngeal- Multi-consistency -- Pharyngeal -- Pharyngeal- Pill -- Pharyngeal -- Pharyngeal Comment --  No flowsheet data found. PAYNE, COURTNEY 10/19/2020, 10:00 AM      Weston Anna, MA, CCC-SLP [phone]         No results for input(s): WBC, HGB, HCT, PLT in the last 72 hours. Recent Labs    10/18/20 0412 10/19/20 0719  NA 130* 129*  K 5.2* 5.1  CL 89* 89*  CO2 33* 35*  GLUCOSE 127* 117*  BUN 46* 48*  CREATININE 1.03 1.10  CALCIUM 9.1 8.8*    Intake/Output Summary (Last 24 hours) at 10/19/2020 1054 Last data filed at 10/19/2020 0541 Gross per 24 hour  Intake 0 ml  Output 1700 ml  Net -1700 ml     Pressure Injury 09/12/20 Back Mid;Lower Stage 1 -  Intact skin with  non-blanchable redness of a localized area usually over a bony prominence. Blister-like skin breakdown from patients TSLO brace (Active)  09/12/20 2000  Location: Back  Location Orientation: Mid;Lower  Staging: Stage 1 -  Intact skin with non-blanchable redness of a localized area usually over a bony prominence.  Wound Description (Comments): Blister-like skin breakdown from patients TSLO brace  Present on Admission: Yes    Physical Exam: Vital Signs Blood pressure (!) 104/43, pulse 68, temperature 98.3 F (36.8 C), temperature source Oral, resp. rate 16, weight 80.6 kg, SpO2 97 %.     Constitutional: No distress . Vital signs reviewed. HEENT: EOMI, oral membranes moist Neck: supple Cardiovascular: RRR without murmur. No JVD    Respiratory/Chest: CTA Bilaterally without wheezes or rales. Normal effort    GI/Abdomen: BS +, non-tender, non-distended Ext: no clubbing, cyanosis, or edema Psych: pleasant and cooperative Skin:  Scattered bruises on UE's. Back incision with foam dressing and is CDI, some irritation of skin laterally to right of incision Neuro: awake, alert and appropriate.  No focal CN abnl except for weak cough. Reflexes are 1+ in all 4's.. UE grossly 3/5 prox to distal. LE: 2/5 HF, KE and 3/5 ADF/APF without motor changes today. Senses LT/P in both LE's Musculoskeletal: mild LB tenderness   Assessment/Plan: 1. Functional deficits which require 3+ hours per day of interdisciplinary therapy in a comprehensive inpatient rehab setting.  Physiatrist is providing close team supervision and 24 hour management of active medical problems listed below.  Physiatrist and rehab team continue to assess barriers to discharge/monitor patient progress toward functional and medical goals  Care Tool:  Bathing    Body parts bathed by patient: Right arm,Left arm,Chest,Abdomen,Face,Right upper leg,Left upper leg   Body parts bathed by helper: Front perineal area,Buttocks,Right lower  leg,Left lower leg     Bathing assist Assist Level: Moderate Assistance - Patient 50 - 74%     Upper Body Dressing/Undressing Upper body dressing   What is the patient wearing?: Hospital gown only    Upper body assist Assist Level: Moderate Assistance - Patient 50 - 74%    Lower Body Dressing/Undressing Lower body dressing      What is the patient wearing?: Incontinence brief     Lower body assist Assist for lower body dressing: Total Assistance - Patient < 25%     Toileting Toileting    Toileting assist Assist for toileting: 2 Helpers     Transfers Chair/bed transfer  Transfers assist  Chair/bed transfer activity did not occur: Safety/medical concerns  Chair/bed transfer assist level: Minimal Assistance - Patient > 75%     Locomotion Ambulation   Ambulation assist      Assist level: Moderate Assistance - Patient 50 - 74% Assistive device: Hand held assist Max distance: 2'   Walk 10 feet activity   Assist  Walk 10 feet activity did not occur: Safety/medical concerns        Walk 50 feet activity   Assist Walk 50 feet with 2 turns activity did not occur: Safety/medical concerns  Walk 150 feet activity   Assist Walk 150 feet activity did not occur: Safety/medical concerns         Walk 10 feet on uneven surface  activity   Assist Walk 10 feet on uneven surfaces activity did not occur: Safety/medical concerns         Wheelchair     Assist Will patient use wheelchair at discharge?: No             Wheelchair 50 feet with 2 turns activity    Assist            Wheelchair 150 feet activity     Assist          Blood pressure (!) 104/43, pulse 68, temperature 98.3 F (36.8 C), temperature source Oral, resp. rate 16, weight 80.6 kg, SpO2 97 %.  Medical Problem List and Plan: 1.  T11/12 compression fx s/p posterior fusion secondary to fall with debility due to asp PNA/trach/generalzied weakness              -patient may not shower             -ELOS/Goals: 2-3 weeks- min A  --Continue CIR therapies including PT, OT, and SLP     2.  Antithrombotics: -DVT/anticoagulation:  Pharmaceutical: Other (comment)--Apixaban bid.              -antiplatelet therapy: N/A 3. Pain Management:  hydrocodone prn for severe pain  - Tramadol prn for moderate pain.  5/10 - pain controlled- con't regimen   4. Mood/anxiety/sleep: LCSW to follow for evaluation and support.              -antipsychotic agents: N/A  -xanax bid prn anxiety  -continue trazodone for sleep 5. Neuropsych: This patient is not fully capable of making decisions on his own behalf. 6. Skin/Wound Care: Pressure relief measures.  7. Fluids/Electrolytes/Nutrition: NPO with tube feeds for nutritional support.             5/12--BUN/Cr up sl --increased flushes   -K+ 5.1   -RD changed him to osmolite 1.5 to help with hyperkalemia. Appreciate input 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               --?inflammatory subglottic mucosal, negative for malignancy             --chest PT, sx, OOB  -vest to help with secretions  5/11 secretions better. PCCM following   -downsize week of 5/23 to coincide with f/u bronch 9. Acute on chronic respiratory failure: Encourage pulmonary hygiene              --continue Pulmicort nebs bid.  10.  PAF:  Monitor HR tid--continue Atenolol bid with Cardizem tid.   -HR controlled 5/9 10. Hyponatremia: Has resolved on today's labs. Continue to monitor.  11. Acute blood loss anemia, anemia chronic disease: Monitor with serial checks.   -hgb 8.4 5/6 12. Leukocytosis: WBC continues to fluctuate.   -no new signs of infection  -5/6 wbc 13.5.  5/9 wbc's down to 9.8 13. Crohn's disease:    - once able to swallow pills, can try adding home Crohn's meds to help.    -continue florastor and Lucrezia Starch  -will discuss rectal tube with pt/wife   -try increasing  questran to qid 14.  Bilateral pleural effusion: Pulmonary hygiene w/ flutter valve.              --continue to monitor  for symptoms with increase in activity.   15. Aspiration PNA: zosyn completed 5/5 16. Dysphagia: Continue NPO with Glucerna               -MBS today per SLP demonstrated no improvement in peri-glottic mass/area nor improvement in swallow  -will likely need PEG--will d/w pt and wife 1. T2DM: Was on Humalog tid prior to admission.             --will monitor BS every 4 hours as on tube feeds and use SSI prn    CBG (last 3)  Recent Labs    10/19/20 0001 10/19/20 0408 10/19/20 0801  GLUCAP 121* 155* 126*    -5/11 controlled  LOS: 7 days A FACE TO FACE EVALUATION WAS PERFORMED  Meredith Staggers 10/19/2020, 10:54 AM

## 2020-10-19 NOTE — Progress Notes (Signed)
Occupational Therapy Session Note  Patient Details  Name: Devin [name] MRN: [id number] Date of Birth: Sep 15, 1940  Today's Date: 10/19/2020 OT Individual Time: 1340-1425 OT Individual Time Calculation (min): 45 min   Short Term Goals: Week 1:  OT Short Term Goal 1 (Week 1): Pt will complete bathing with mod assist at sit > stand level OT Short Term Goal 2 (Week 1): Pt will don shirt with min assist OT Short Term Goal 3 (Week 1): Pt will complete toilet transfers with mod assist with LRAD  Skilled Therapeutic Interventions/Progress Updates:    Pt greeted semi-reclined in bed with spouse present. Pt's spouse stated he had been sleeping since she arrived. OT updated wife on previous PT session where pt walked in parallel bars. Pt easy to wake and reluctantly agreeable to participate in OT. Pt completed bed mobility with min A. Noted clotted blood all over his gown. Pt agreeable to change gowns and wash hands where blood was. Per nursing, pt had a lot of bleeding after IV was removed. Pt needed encouragement to assist in his own self care needs, overall max A to change gown and wash hands. Pt agreeable to stand and take side steps to [facility] with max encouragement and CGA with RW. Pt agreeable to there-ex at EOB. Cross body punches (without twisting), straight arm raises, and overhead raise. Pt only able to get shoulders to 70degrees. LB there-ex with seated knee extension, and hip extension. Pt declined any further activity despite encouragement. Pt returned to bed with mod A to lift LEs back in bed. Pt washed face with set-up A, then complete oral care using suction toothbrush. OT went and looked for a better wc cushion to protect sacrum, but desired cushion is not available at this time. Pt left semi-reclined in bed with bed alarm on, call bell in reach, suction in reach, and needs met.   Therapy Documentation Precautions:  Precautions Precautions: Fall,Back Precaution Comments: rectal tube, trach  collar, cortrak, TLSO Required Braces or Orthoses: Spinal Brace Spinal Brace: Thoracolumbosacral orthotic,Applied in sitting position Restrictions Weight Bearing Restrictions: No Pain:  Pt reports back pain, no number given. Rest and repositioning for comfort.  Therapy/Group: Individual Therapy  [name] 10/19/2020, 2:31 PM

## 2020-10-19 NOTE — Progress Notes (Signed)
Physical Therapy Session Note  Patient Details  Name: Devin [name] MRN: [id number] Date of Birth: 07/31/1940  Today's Date: 10/19/2020 PT Individual Time: 0950-1059 PT Individual Time Calculation (min): 69 min   Short Term Goals: Week 1:  PT Short Term Goal 1 (Week 1): Pt will complete bed mobility consistently with minA. PT Short Term Goal 2 (Week 1): Pt will perform bed to chair transfer consistently with minA. PT Short Term Goal 3 (Week 1): Pt will ambulate x25' with minA and LRAD.  Skilled Therapeutic Interventions/Progress Updates:     Pt received supine in bed and agrees to therapy. No complaint of pain and had recently returned from barium swallow. Pt reports fatigue but is agreeable to therapy. Supine to sit with modA and cues for logrolling. PT provides totalA to don TLSO at EOB. Stand pivot transfer to [facility] with RW and minA. WC transport to gym for time management and energy conservation. Pt performs sit to stand and gait training in parallel bars. Multiple reps of sit to stand with close supervision and cues for hand placement and body mechanics. In standing, PT cues for posture to improve balance and upright gaze for body mechanics. Pt then performs marching in place to adjust to weight bearing in single leg stance. Pt progresses to forward and backward ambulation. Initially pt performs x5' forward and 5' backward, then requires seated rest break. Pt performs same distance x3 bouts with extended seated rest breaks between each bout. PT checks O2 sats and pt 98% on 4L via trach collar. Pt ambulates final bout of x10' forward and x10' backward. WC transport back to room. Stand step transfer back to bed with minA. Sit to supine with modA. Left with alarm intact and all needs within reach.  Therapy Documentation Precautions:  Precautions Precautions: Fall,Back Precaution Comments: rectal tube, trach collar, cortrak, TLSO Required Braces or Orthoses: Spinal Brace Spinal Brace:  Thoracolumbosacral orthotic,Applied in sitting position Restrictions Weight Bearing Restrictions: No   Therapy/Group: Individual Therapy  Breck Coons, PT, DPT 10/19/2020, 10:38 AM

## 2020-10-20 LAB — GLUCOSE, CAPILLARY
Glucose-Capillary: 149 mg/dL — ABNORMAL HIGH (ref 70–99)
Glucose-Capillary: 151 mg/dL — ABNORMAL HIGH (ref 70–99)
Glucose-Capillary: 153 mg/dL — ABNORMAL HIGH (ref 70–99)
Glucose-Capillary: 156 mg/dL — ABNORMAL HIGH (ref 70–99)
Glucose-Capillary: 177 mg/dL — ABNORMAL HIGH (ref 70–99)
Glucose-Capillary: 182 mg/dL — ABNORMAL HIGH (ref 70–99)

## 2020-10-20 MED ORDER — MESALAMINE ER 250 MG PO CPCR
1000.0000 mg | ORAL_CAPSULE | Freq: Three times a day (TID) | ORAL | Status: DC
  Administered 2020-10-20 – 2020-10-25 (×12): 1000 mg via ORAL
  Filled 2020-10-20 (×18): qty 4

## 2020-10-20 NOTE — Progress Notes (Addendum)
PROGRESS NOTE   Subjective/Complaints:  Pt seems to be in reasonable spirits. Still some secretions but managing better. Denies feeling sob this morning.   ROS: Patient denies fever, rash, sore throat, blurred vision, nausea, vomiting, diarrhea, cough, shortness of breath or chest pain, joint or back pain, headache, or mood change.     Objective:   DG Swallowing Func-Speech Pathology  Result Date: 10/19/2020 Objective Swallowing Evaluation: Type of Study: MBS-Modified Barium Swallow Study  Patient Details Name: Devin [name] MRN: [id number] Date of Birth: 1941/06/07 Today's Date: 10/19/2020 Past Medical History: Past Medical History: Diagnosis Date . A-fib ([place])  . 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 Ankylosing spondylitis ([place])   Diagnosed during lumbar fracture summer of 2016   . Crohn's disease ([place])  . Gout  . HIstory of basal cell cancer of face   THYROID CA HX . History of kidney stones  . Hypertension  . Hypothyroidism  . Impotence  . 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 Obesity (BMI 30-39.9)  . Osteoporosis   Pt completed 5 years of fosamax in 2013    . Prostate cancer with recurrence   Treated with prostatectomy with recurrence 2012 with Lupron treatment now him  . Psoriasis  . Thyroid cancer ([place]) 1999  Treated with RAI and total thyroidectomy  . Type II diabetes mellitus ([place])  Past Surgical History: Past Surgical History: Procedure Laterality Date . ABDOMINAL EXPLORATION SURGERY    for small bowel obstruction . APPENDECTOMY  03/2007 . BACK SURGERY   . BASAL CELL CARCINOMA EXCISION  "several"  "head" . CARDIAC CATHETERIZATION  03/17/2003 . CHOLECYSTECTOMY N/A 05/07/2016  Procedure: LAPAROSCOPIC CHOLECYSTECTOMY;  Surgeon: Fanny Skates, MD;  Location: [facility];  Service: General;  Laterality: N/A; . COLON SURGERY  03/2007  Resection of cecum, appendix, terminal ileum (approximately/notes  10/10/2010 . CYSTOSCOPY/URETEROSCOPY/HOLMIUM LASER/STENT PLACEMENT Right 07/03/2020  Procedure: CYSTOSCOPY/RETROGRADE/URETEROSCOPY REMOVAL OF BLADDER STONE;  Surgeon: Raynelle Bring, MD;  Location: [facility];  Service: Urology;  Laterality: Right; . DIRECT LARYNGOSCOPY N/A 10/04/2020  Procedure: DIRECT LARYNGOSCOPY, PHARYNGOSCOPY WITH BIOPSY;  Surgeon: [name], DO;  Location: [facility];  Service: ENT;  Laterality: N/A; . ESOPHAGOSCOPY N/A 10/04/2020  Procedure: ESOPHAGOSCOPY WITH BIOPSY;  Surgeon: [name], DO;  Location: [facility];  Service: ENT;  Laterality: N/A; . HERNIA REPAIR   . LAMINECTOMY WITH POSTERIOR LATERAL ARTHRODESIS LEVEL 1 N/A 09/15/2020  Procedure: REVISION OF THORACOLUMBAR FUSION, ADDITION OF CROSS-CONNECTORS;  Surgeon: Eustace Moore, MD;  Location: [facility];  Service: Neurosurgery;  Laterality: N/A; . LAPAROSCOPIC CHOLECYSTECTOMY  05/07/2016 . LAPAROSCOPIC LYSIS OF ADHESIONS  05/07/2016 . LAPAROSCOPIC LYSIS OF ADHESIONS N/A 05/07/2016  Procedure: LAPAROSCOPIC LYSIS OF ADHESIONS TIMES ONE HOUR;  Surgeon: Fanny Skates, MD;  Location: [facility];  Service: General;  Laterality: N/A; . POSTERIOR FUSION THORACIC SPINE  02/08/2016  1. Posterior thoracic arthrodesis T7-T11 utilizing morcellized allograft, 2. Posterior thoracic segmental fixation T7-T11 utilizing nuvasive pedicle screws . PROSTATECTOMY  06/2001  w/bilateral pelvic lymph nose dissection/notes 10/24/2010 . SPINAL FUSION  12/2014  Open reduction internal fixation of L1 Chance fracture with posterior fusion T10-L4 utilizing morcellized allograft and some local autograft, segmental  instrumentation T10-L4 inclusive utilizing nuvasive pedicle screws/notes 12/16/2014 . Stress Cardiolite  02/17/2003 . THOROCOTOMY WITH LOBECTOMY  03/16/2015  Procedure: THOROCOTOMY WITH LOBECTOMY;  Surgeon: Ivin Poot, MD;  Location: [facility];  Service: Thoracic;; . TONSILLECTOMY   . TOTAL THYROIDECTOMY  1997 . Venous Doppler  05/30/2004 . VENTRAL HERNIA REPAIR   04/14/2008 . VIDEO ASSISTED THORACOSCOPY Left 03/16/2015  Procedure: VIDEO ASSISTED THORACOSCOPY;  Surgeon: Ivin Poot, MD;  Location: [facility];  Service: Thoracic;  Laterality: Left; HPI: See H&P  Subjective: alert Assessment / Plan / Recommendation CHL IP CLINICAL IMPRESSIONS 10/19/2020 Clinical Impression Patient demonstrates minimal functional change since initial MBS on 09/29/20. Patient continues to present with a severe pharyngeal dysphagia that appears related to a thickening of the preverterbral tissue at the level of C4-C5.  This results in decreased epiglottic closure and redirects the bolus into the laryngeal vestibule resulting in penetration and eventual aspiration. With PMV in place and extra time and effort, the patient had a moderately strong cough response and ejected most of the barium effectively; however, it tended to fall immediately back into the larynx. Postural changes were not effective in reducing penetration or aspiration.  Discussed results with Dr. Naaman Plummer. Recommend continue NPO and suspect need for long-term alternative means of nutrition.  SLP will continue to follow. SLP Visit Diagnosis Dysphagia, oropharyngeal phase (R13.12) Attention and concentration deficit following -- Frontal lobe and executive function deficit following -- Impact on safety and function Severe aspiration risk   CHL IP TREATMENT RECOMMENDATION 10/19/2020 Treatment Recommendations Therapy as outlined in treatment plan below   Prognosis 10/19/2020 Prognosis for Safe Diet Advancement Fair Barriers to Reach Goals -- Barriers/Prognosis Comment -- CHL IP DIET RECOMMENDATION 10/19/2020 SLP Diet Recommendations NPO;Alternative means - long-term Liquid Administration via -- Medication Administration Via alternative means Compensations -- Postural Changes --   CHL IP OTHER RECOMMENDATIONS 10/19/2020 Recommended Consults -- Oral Care Recommendations Oral care QID Other Recommendations --   CHL IP FOLLOW UP RECOMMENDATIONS  10/19/2020 Follow up Recommendations Inpatient Rehab   CHL IP FREQUENCY AND DURATION 10/19/2020 Speech Therapy Frequency (ACUTE ONLY) min 3x week Treatment Duration 3 weeks      CHL IP ORAL PHASE 10/19/2020 Oral Phase Impaired Oral - Pudding Teaspoon -- Oral - Pudding Cup -- Oral - Honey Teaspoon -- Oral - Honey Cup -- Oral - Nectar Teaspoon Holding of bolus;Delayed oral transit Oral - Nectar Cup -- Oral - Nectar Straw -- Oral - Thin Teaspoon -- Oral - Thin Cup -- Oral - Thin Straw -- Oral - Puree -- Oral - Mech Soft -- Oral - Regular -- Oral - Multi-Consistency -- Oral - Pill -- Oral Phase - Comment --  CHL IP PHARYNGEAL PHASE 10/19/2020 Pharyngeal Phase Impaired Pharyngeal- Pudding Teaspoon -- Pharyngeal -- Pharyngeal- Pudding Cup -- Pharyngeal -- Pharyngeal- Honey Teaspoon -- Pharyngeal -- Pharyngeal- Honey Cup -- Pharyngeal -- Pharyngeal- Nectar Teaspoon Delayed swallow initiation-vallecula;Reduced epiglottic inversion;Reduced airway/laryngeal closure;Penetration/Aspiration during swallow;Penetration/Apiration after swallow;Moderate aspiration;Pharyngeal residue - pyriform;Pharyngeal residue - valleculae Pharyngeal -- Pharyngeal- Nectar Cup -- Pharyngeal -- Pharyngeal- Nectar Straw -- Pharyngeal -- Pharyngeal- Thin Teaspoon -- Pharyngeal -- Pharyngeal- Thin Cup -- Pharyngeal -- Pharyngeal- Thin Straw -- Pharyngeal -- Pharyngeal- Puree NT Pharyngeal -- Pharyngeal- Mechanical Soft -- Pharyngeal -- Pharyngeal- Regular -- Pharyngeal -- Pharyngeal- Multi-consistency -- Pharyngeal -- Pharyngeal- Pill -- Pharyngeal -- Pharyngeal Comment --  No flowsheet data found. PAYNE, COURTNEY 10/19/2020, 10:00 AM      [place], [place], [place]  No results for input(s): WBC, HGB, HCT, PLT in the last 72 hours. Recent Labs    10/18/20 0412 10/19/20 0719  NA 130* 129*  K 5.2* 5.1  CL 89* 89*  CO2 33* 35*  GLUCOSE 127* 117*  BUN 46* 48*  CREATININE 1.03 1.10  CALCIUM 9.1 8.8*    Intake/Output Summary  (Last 24 hours) at 10/20/2020 1145 Last data filed at 10/20/2020 0500 Gross per 24 hour  Intake 0 ml  Output 2571 ml  Net -2571 ml     Pressure Injury 09/12/20 Back Mid;Lower Stage 1 -  Intact skin with non-blanchable redness of a localized area usually over a bony prominence. Blister-like skin breakdown from patients TSLO brace (Active)  09/12/20 2000  Location: Back  Location Orientation: Mid;Lower  Staging: Stage 1 -  Intact skin with non-blanchable redness of a localized area usually over a bony prominence.  Wound Description (Comments): Blister-like skin breakdown from patients TSLO brace  Present on Admission: Yes    Physical Exam: Vital Signs Blood pressure 105/60, pulse 66, temperature 98.1 F (36.7 C), temperature source Oral, resp. rate 16, weight 99 kg, SpO2 95 %.     Constitutional: No distress . Vital signs reviewed. HEENT: EOMI, oral membranes moist, NGT Neck: #6 XLT trach, dysphonic with PMV Cardiovascular: RRR without murmur. No JVD    Respiratory/Chest: scattered rhonchi    GI/Abdomen: BS +, non-tender, non-distended Ext: no clubbing, cyanosis, or edema Psych: pleasant and cooperative Skin:  Scattered bruises on UE's. Back incision CDI, abrasion/redness alongside of incision Neuro: awake, alert and appropriate.  No focal CN abnl except for weak cough. Reflexes are 1+ in all 4's.. UE grossly 3/5 prox to distal. LE: 2/5 HF, KE and 3/5 ADF/APF no motor changes today. Senses LT/P in both LE's Musculoskeletal: LB TTP   Assessment/Plan: 1. Functional deficits which require 3+ hours per day of interdisciplinary therapy in a comprehensive inpatient rehab setting.  Physiatrist is providing close team supervision and 24 hour management of active medical problems listed below.  Physiatrist and rehab team continue to assess barriers to discharge/monitor patient progress toward functional and medical goals  Care Tool:  Bathing    Body parts bathed by patient: Right  arm,Left arm,Chest,Abdomen,Face,Right upper leg,Left upper leg   Body parts bathed by helper: Front perineal area,Buttocks,Right lower leg,Left lower leg     Bathing assist Assist Level: Moderate Assistance - Patient 50 - 74%     Upper Body Dressing/Undressing Upper body dressing   What is the patient wearing?: Hospital gown only    Upper body assist Assist Level: Moderate Assistance - Patient 50 - 74%    Lower Body Dressing/Undressing Lower body dressing      What is the patient wearing?: Incontinence brief     Lower body assist Assist for lower body dressing: Total Assistance - Patient < 25%     Toileting Toileting    Toileting assist Assist for toileting: 2 Helpers     Transfers Chair/bed transfer  Transfers assist  Chair/bed transfer activity did not occur: Safety/medical concerns  Chair/bed transfer assist level: Minimal Assistance - Patient > 75%     Locomotion Ambulation   Ambulation assist      Assist level: Contact Guard/Touching assist Assistive device: Parallel bars Max distance: 20'   Walk 10 feet activity   Assist  Walk 10 feet activity did not occur: Safety/medical concerns  Assist level: Contact Guard/Touching assist Assistive device: Parallel bars   Walk 50 feet activity  Assist Walk 50 feet with 2 turns activity did not occur: Safety/medical concerns         Walk 150 feet activity   Assist Walk 150 feet activity did not occur: Safety/medical concerns         Walk 10 feet on uneven surface  activity   Assist Walk 10 feet on uneven surfaces activity did not occur: Safety/medical concerns         Wheelchair     Assist Will patient use wheelchair at discharge?: No             Wheelchair 50 feet with 2 turns activity    Assist            Wheelchair 150 feet activity     Assist          Blood pressure 105/60, pulse 66, temperature 98.1 F (36.7 C), temperature source Oral, resp.  rate 16, weight 99 kg, SpO2 95 %.  Medical Problem List and Plan: 1.  T11/12 compression fx s/p posterior fusion secondary to fall with debility due to asp PNA/trach/generalzied weakness             -patient may not shower             -ELOS/Goals: 2-3 weeks- min A  -Continue CIR therapies including PT, OT, and SLP  2.  Antithrombotics: -DVT/anticoagulation:  Pharmaceutical: Other (comment)--Apixaban bid.              -antiplatelet therapy: N/A 3. Pain Management:  hydrocodone prn for severe pain  - Tramadol prn for moderate pain.  5/10 - pain controlled- con't regimen   4. Mood/anxiety/sleep: LCSW to follow for evaluation and support.              -antipsychotic agents: N/A  -xanax bid prn anxiety  -continue trazodone for sleep 5. Neuropsych: This patient is not fully capable of making decisions on his own behalf. 6. Skin/Wound Care: Pressure relief measures.  7. Fluids/Electrolytes/Nutrition: NPO with tube feeds for nutritional support.             5/12--BUN/Cr up sl --increased flushes   -K+ 5.1   -RD changed him to osmolite 1.5 to help with hyperkalemia. Appreciate input  5/13- recheck BMET Monday  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               --?inflammatory subglottic mucosal, negative for malignancy             --chest PT, sx, OOB  -vest to help with secretions  5/11 secretions better. PCCM following   -downsize week of 5/23 to coincide with f/u bronch 9. Acute on chronic respiratory failure: Encourage pulmonary hygiene              --continue Pulmicort nebs bid.  10.  PAF:  Monitor HR tid--continue Atenolol bid with Cardizem tid.   -HR controlled 5/9 10. Hyponatremia: Has resolved on today's labs. Continue to monitor.  11. Acute blood loss anemia, anemia chronic disease: Monitor with serial checks.   -hgb 8.4 5/6 12. Leukocytosis: WBC continues to fluctuate.   -no new signs of infection  -5/6 wbc  13.5.  5/9 wbc's down to 9.8 13. Crohn's disease:    - once able to swallow pills, can try adding home Crohn's meds to help.    -continue florastor and questran  5/13 -will discuss rectal tube with pt/wife today when we meet   -have increased Lucrezia Starch  to qid   -will TRY to pour granules from mesalamine capsule into syringe for injection into NGT. Not sure it will work, but stool continues to be completely liquid in form. Wife understands the dilemma here.  14.  Bilateral pleural effusion: Pulmonary hygiene w/ flutter valve.              --continue to monitor for symptoms with increase in activity.   15. Aspiration PNA: zosyn completed 5/5 16. Dysphagia: Continue NPO with Glucerna               -MBS 5/12 per SLP demonstrated no improvement in peri-glottic mass/area nor improvement in swallow  -he is going to need PEG--discussed it at length with pt and wife today. They'll think about it over the weekend.  Will contact IR Monday if they're ready to move forward.   -PCCM in agreement with PEG 17. T2DM: Was on Humalog tid prior to admission.             --will monitor BS every 4 hours as on tube feeds and use SSI prn    CBG (last 3)  Recent Labs    10/19/20 2251 10/20/20 0425 10/20/20 0929  GLUCAP 171* 151* 156*    -5/13 fair control, continue just SSI for now  LOS: 8 days A FACE TO FACE EVALUATION WAS PERFORMED  Meredith Staggers 10/20/2020, 11:45 AM

## 2020-10-20 NOTE — Progress Notes (Signed)
Occupational Therapy Weekly Progress Note  Patient Details  Name: Devin [name] MRN: [id number] Date of Birth: 10/02/1940  Beginning of progress report period: Oct 13, 2020 End of progress report period: Oct 20, 2020  Today's Date: 10/20/2020 OT Individual Time: 1302-1400 OT Individual Time Calculation (min): 58 min    Patient has met 1 of 3 short term goals.  Pt is making slow, but steady progress towards OT goals. Pt has very poor endurance within functional tasks. He can transfer with min A and RW, but then has little energy to perform functional tasks on his own. Overall still max/total A for LB ADLs and min/mod A for UB ADLs. Continue current POC.  Patient continues to demonstrate the following deficits: muscle weakness, decreased cardiorespiratoy endurance and decreased oxygen support and decreased sitting balance, decreased standing balance, decreased postural control and decreased balance strategies and therefore will continue to benefit from skilled OT intervention to enhance overall performance with BADL and Reduce care partner burden.  Patient progressing toward long term goals..  Continue plan of care.  OT Short Term Goals Week 1:  OT Short Term Goal 1 (Week 1): Pt will complete bathing with mod assist at sit > stand level OT Short Term Goal 1 - Progress (Week 1): Progressing toward goal OT Short Term Goal 2 (Week 1): Pt will don shirt with min assist OT Short Term Goal 3 (Week 1): Pt will complete toilet transfers with mod assist with LRAD OT Short Term Goal 3 - Progress (Week 1): Met Week 2:  OT Short Term Goal 1 (Week 2): Pt will complete bathing with mod assist at sit > stand level OT Short Term Goal 2 (Week 2): Pt will don shirt with min assist OT Short Term Goal 3 (Week 2): Pt will complete toilet transfer with min A and LRAD  Skilled Therapeutic Interventions/Progress Updates:    Pt greeted semi-reclined in bed with wife present. Pt needed encouragement to  participate this afternoon. Pt finally agreeable to come to sitting EOB with min A. Pt sat EOB for ~2 minutes, then said he could not do it anymore and returned to supine. Pt reported pain and nausea. With encouragement from spouse and OT, pt agreeable to get back up to TIS wc. Pt completed bed mobility again in similar fashion, TLSO donned with max A, then pt needed mod A to stand w/ RW from lower bed. Pt ambulated to the TIS wc with RW and Min A. Pt noted to be incontinent of urine. Had pt stand again for total A peri-care and brief change. While in standing and brief off, pt began urinating on the floor. Pt brought to the sink for bathing/dressing tasks. OT educated pt's spouse on ROHO cushion and use to reduce risk of pressure on bottom. Pt completed UB bathing with mod A and encouragement. Pt able to achieve figure 4 postiion with OT assist, but unable to reach feet. Max A to wash feet, don TED hose, and clean socks after they had been soiled with urine. Pt unable to tolerate staying up in wc and compkleted stand-pivot back to bed with RW and min A. Pt left semi-reclined in bed with bed alarm on, call bell in reach, suction in reach, and needs met.   Therapy Documentation Precautions:  Precautions Precautions: Fall,Back Precaution Booklet Issued: No Precaution Comments: rectal tube, trach collar, cortrak, TLSO Required Braces or Orthoses: Spinal Brace Spinal Brace: Thoracolumbosacral orthotic,Applied in sitting position Restrictions Weight Bearing Restrictions: No Pain:  No   number given, reports high back pain, rest and repositioned for comfort  Therapy/Group: Individual Therapy  [name] 10/20/2020, 2:13 PM

## 2020-10-20 NOTE — Progress Notes (Signed)
Physical Therapy Weekly Progress Note  Patient Details  Name: Devin [name] MRN: [id number] Date of Birth: 04-15-1941  Beginning of progress report period: Oct 13, 2020 End of progress report period: Oct 20, 2020  Today's Date: 10/20/2020 PT Missed Time: 30 Minutes Missed Time Reason: Patient fatigue  Patient has met 2 of 3 short term goals.  Pt is progressing toward mobility goals, improving independence with bed mobility and functional transfers, and initiating gait training. Pt consistently performing bed mobility and functional transfers at minA level and has ambulated up to 20' in parallel bars. Pt has significant endurance impairment, limiting activity tolerance and progress toward goals, as he has missed multiple sessions secondary to fatigue.   Patient continues to demonstrate the following deficits muscle weakness, decreased cardiorespiratoy endurance and decreased oxygen support and decreased sitting balance, decreased standing balance, decreased postural control and decreased balance strategies and therefore will continue to benefit from skilled PT intervention to increase functional independence with mobility.  Patient progressing toward long term goals..  Continue plan of care.  PT Short Term Goals Week 1:  PT Short Term Goal 1 (Week 1): Pt will complete bed mobility consistently with minA. PT Short Term Goal 1 - Progress (Week 1): Met PT Short Term Goal 2 (Week 1): Pt will perform bed to chair transfer consistently with minA. PT Short Term Goal 2 - Progress (Week 1): Met PT Short Term Goal 3 (Week 1): Pt will ambulate x25' with minA and LRAD. PT Short Term Goal 3 - Progress (Week 1): Progressing toward goal Week 2:  PT Short Term Goal 1 (Week 2): Pt will perform bed mobility consistently with CGA. PT Short Term Goal 2 (Week 2): Pt will perform bed to chair consistently with CGA. PT Short Term Goal 3 (Week 2): Pt will ambulate 45' with minA and LRAD.  Skilled Therapeutic  Interventions/Progress Updates:  DME/adaptive equipment instruction;Ambulation/gait training;Community reintegration;Neuromuscular re-education;Psychosocial support;Stair training;UE/LE Strength taining/ROM;Wheelchair propulsion/positioning;Balance/vestibular training;Discharge planning;Functional electrical stimulation;Pain management;Skin care/wound management;Therapeutic Activities;UE/LE Coordination activities;Cognitive remediation/compensation;Disease management/prevention;Functional mobility training;Patient/family education;Splinting/orthotics;Therapeutic Exercise;Visual/perceptual remediation/compensation   Pt refuses PT secondary to fatigue. PT educates on importance of participation. Will follow up as appropriate.  Therapy Documentation Precautions:  Precautions Precautions: Fall,Back Precaution Booklet Issued: No Precaution Comments: rectal tube, trach collar, cortrak, TLSO Required Braces or Orthoses: Spinal Brace Spinal Brace: Thoracolumbosacral orthotic,Applied in sitting position Restrictions Weight Bearing Restrictions: No  Therapy/Group: Individual Therapy  Breck Coons, PT, DPT 10/20/2020, 3:22 PM

## 2020-10-20 NOTE — Progress Notes (Signed)
Physical Therapy Session Note  Patient Details  Name: Devin [name] MRN: [id number] Date of Birth: Nov 19, 1940  Today's Date: 10/20/2020 PT Individual Time: 0802-0846 PT Individual Time Calculation (min): 44 min   Short Term Goals: Week 1:  PT Short Term Goal 1 (Week 1): Pt will complete bed mobility consistently with minA. PT Short Term Goal 1 - Progress (Week 1): Met PT Short Term Goal 2 (Week 1): Pt will perform bed to chair transfer consistently with minA. PT Short Term Goal 2 - Progress (Week 1): Met PT Short Term Goal 3 (Week 1): Pt will ambulate x25' with minA and LRAD. PT Short Term Goal 3 - Progress (Week 1): Progressing toward goal Week 2:  PT Short Term Goal 1 (Week 2): Pt will perform bed mobility consistently with CGA. PT Short Term Goal 2 (Week 2): Pt will perform bed to chair consistently with CGA. PT Short Term Goal 3 (Week 2): Pt will ambulate 55' with minA and LRAD.  Skilled Therapeutic Interventions/Progress Updates:  Patient supine in bed asleep on entrance to room. Patient easy to arouse but then closes eyes again. Continues to intermittently close eyes during session. Pt agreeable to PT session. Patient with mild pain complaint during session with inability to rate numerically. Extensive time required to untangle numerous lines for OOB activity. SpO2 at 95-96% throughout session.   Therapeutic Activity: Bed Mobility: Patient performed supine --> sit with Supervision for BLE and Min A for UB to reach EOB. VC/ tc required for technique. Transfers: Patient performed STS and SPVT transfers using RW with CGA. Provided vc for hand placement and progression for safe rise to stand and descent to sit. Upon sitting, pt states he is dizzy. With extensive time, he relates that dizziness does not dissipate. Recommended to pt to return to bed and requires CGA/ Min A for power up and completion to EOB with line mgmt. Pt requires Min A to complete lateral scoot for improved  positioning toward EOB. Requires Mod A for BLE to reach EOB with pt able to bring UB to sidelying to L side. Roll to supine with supervision.   BP taken with reading at 119/ 56 (75) and pulse at 58 bpm. Requires Min A and vc for sequence of mvmts in order to improve bed positioning. Bed controls and pillows used to align in neutral body positioning and heels floated on pillow.   Patient supine in bed at end of session with all needs within reach to tray table placed at L side and call button left in lap.    Therapy Documentation Precautions:  Precautions Precautions: Fall,Back Precaution Booklet Issued: No Precaution Comments: rectal tube, trach collar, cortrak, TLSO Required Braces or Orthoses: Spinal Brace Spinal Brace: Thoracolumbosacral orthotic,Applied in sitting position Restrictions Weight Bearing Restrictions: No  Therapy/Group: Individual Therapy  Alger Simons PT, DPT 10/20/2020, 4:57 PM

## 2020-10-20 NOTE — Progress Notes (Signed)
Speech Language Pathology Weekly Progress and Session Note  Patient Details  Name: Devin [name] MRN: [id number] Date of Birth: 1940/08/26  Beginning of progress report period: Oct 13, 2020 End of progress report period: Oct 20, 2020  Today's Date: 10/20/2020 SLP Individual Time: 8828-0034 SLP Individual Time Calculation (min): 40 min  Short Term Goals: Week 1: SLP Short Term Goal 1 (Week 1): Patient will initiate verbal responses vs use of gestures for communication with PMSV in place in 50% of opportunities with Mod verbal cues. SLP Short Term Goal 1 - Progress (Week 1): Met SLP Short Term Goal 2 (Week 1): Patient will utilize an increased vocal intensity at the phrase level to achieve ~80% intelligibility with Mod A verbal cues. SLP Short Term Goal 2 - Progress (Week 1): Met SLP Short Term Goal 3 (Week 1): Patient will recall swallowing and safety precautions for trials of ice chips wth Mod A multimodal cues. SLP Short Term Goal 3 - Progress (Week 1): Met SLP Short Term Goal 4 (Week 1): Patient will utilize external memory aids to recall new, daily information with supervision level verbal cues. SLP Short Term Goal 4 - Progress (Week 1): Not met    New Short Term Goals: Week 2: SLP Short Term Goal 1 (Week 2): Patient will initiate verbal responses vs use of gestures for communication with PMSV in place in 75% of opportunities with Mod verbal cues. SLP Short Term Goal 2 (Week 2): Patient will utilize an increased vocal intensity at the phrase level to achieve ~90% intelligibility with Mod A verbal cues. SLP Short Term Goal 3 (Week 2): Patient will recall swallowing and safety precautions for trials of ice chips wth Min A multimodal cues. SLP Short Term Goal 4 (Week 2): Patient will utilize external memory aids to recall new, daily information with supervision level verbal cues.  Weekly Progress Updates: Patient has made functional gains and has met 3 of 4 STGs this reporting period.  Currently, patient is tolerating his PMSV during all waking hours with vitals remaining WFL. Patient continues to require overall Mod A verbal cues for use of verbal expression instead of gestures and for use of an increased vocal intensity to improve intelligibility. Patient had repeat MBS today without any functional change noted in swallow function, therefore, recommend patient remain NPO with need for long-term alternative means of nutrition. [place] also demonstrates decreased recall of new information requiring overall Mod A verbal cues for carryover of information. Patient and family education ongoing. Patient would benefit from continued skilled SLP intervention to maximize his speech, swallow and cognitive functioning prior to discharge.     Intensity: Minumum of 1-2 x/day, 30 to 90 minutes Frequency: 3 to 5 out of 7 days Duration/Length of Stay: 11/04/20 Treatment/Interventions: Dysphagia/aspiration precaution training;Speech/Language facilitation;Cueing hierarchy;Environmental controls;Therapeutic Activities;Patient/family education;Functional tasks   Daily Session  Skilled Therapeutic Interventions: Skilled treatment session focused on speech and cognitive goals. SLP facilitated session by providing Min verbal cues for initiation of verbal responses and Mod verbal cues for use of an increased vocal intensity to maximize intelligibility. Patient also required Mod verbal cues for recall of information regarding results of MBS and overall swallow function. Patient performed oral care via the suction toothbrush with set-up assist and consumed 2 ice chips with moderate s/s of aspiration noted and oral expectoration of suspected residuals. Patient left upright in bed with alarm on and all needs within reach. Continue with current plan of care.       Pain No/Denies  Pain   Therapy/Group: Individual Therapy  Sierra Spargo 10/20/2020, 3:28 PM

## 2020-10-20 NOTE — Progress Notes (Signed)
Patient ID: Devin [name], male   DOB: 1940/06/14, 80 y.o.   MRN: [id number]  SW received updates from [place] in which the branch declined referral.   SW sent HHPT/OT/SLP/SN/aide referral to [facility] and waiting on follow-up.   Loralee Pacas, MSW, [place] Office: [phone] Cell: [phone] Fax: [phone]

## 2020-10-21 LAB — BASIC METABOLIC PANEL
Anion gap: 7 (ref 5–15)
BUN: 45 mg/dL — ABNORMAL HIGH (ref 8–23)
CO2: 31 mmol/L (ref 22–32)
Calcium: 8.5 mg/dL — ABNORMAL LOW (ref 8.9–10.3)
Chloride: 89 mmol/L — ABNORMAL LOW (ref 98–111)
Creatinine, Ser: 1.03 mg/dL (ref 0.61–1.24)
GFR, Estimated: >60 mL/min (ref >60)
Glucose, Bld: 134 mg/dL — ABNORMAL HIGH (ref 70–99)
Potassium: 5.1 mmol/L (ref 3.5–5.1)
Sodium: 127 mmol/L — ABNORMAL LOW (ref 135–145)

## 2020-10-21 LAB — GLUCOSE, CAPILLARY
Glucose-Capillary: 154 mg/dL — ABNORMAL HIGH (ref 70–99)
Glucose-Capillary: 158 mg/dL — ABNORMAL HIGH (ref 70–99)
Glucose-Capillary: 158 mg/dL — ABNORMAL HIGH (ref 70–99)
Glucose-Capillary: 162 mg/dL — ABNORMAL HIGH (ref 70–99)
Glucose-Capillary: 173 mg/dL — ABNORMAL HIGH (ref 70–99)

## 2020-10-21 MED ORDER — LOPERAMIDE HCL 1 MG/7.5ML PO SUSP
2.0000 mg | Freq: Four times a day (QID) | ORAL | Status: DC
  Administered 2020-10-21 – 2020-10-22 (×4): 2 mg
  Filled 2020-10-21 (×5): qty 15

## 2020-10-21 NOTE — Progress Notes (Signed)
Physical Therapy Session Note  Patient Details  Name: Devin [name] MRN: [id number] Date of Birth: 06-20-1940  Today's Date: 10/21/2020 PT Individual Time: 0857-0950 PT Individual Time Calculation (min): 53 min  and Today's Date: 10/21/2020 PT Missed Time: 22 Minutes Missed Time Reason: Patient fatigue  Short Term Goals: Week 2:  PT Short Term Goal 1 (Week 2): Pt will perform bed mobility consistently with CGA. PT Short Term Goal 2 (Week 2): Pt will perform bed to chair consistently with CGA. PT Short Term Goal 3 (Week 2): Pt will ambulate 25' with minA and LRAD.  Skilled Therapeutic Interventions/Progress Updates:    Patient received reclined in bed, RN present providing AM rx. Patient reports 2-4/20 pain in back, receiving pain rx. PT providing rest breaks, distractions and repositioning to assist with pain management. Upon receiving rx via Cortrak, tubing became clogged requiring repositioning and further RN intervention to [employer]. Patient requiring extensive encouragement to participate in minimal therapy. He reports fatigue. Patient also reporting feeling as though he needed to have a bowel movement. PT advising patient that he had the rectal tube in place and could do so. Furthermore, upright posturing would assist with bowel elimination. Upon assessment, patient found to have formed stool blocking tubing of rectal tube. RN advising that tube be removed if patient is having formed stools. RN present to remove rectal tube. Patient with small amount of formed bowel and urinary incontinence requiring perihygiene and brief change at bedlevel. Throughout rectal tube assessment and perihygiene, patient able to roll B with CGA/MinA and verbal cues to adhere to spinal precautions. Patient reporting increase in pain when laying on side, R >L. Once this had been completed, patient reporting too much fatigue to continue with therapy, requesting to remain in bed. PT attempting to further  encourage patient to sit on edge of bed or attempt standing, however patient continued to decline. Patient remaining in bed, 4 rails up, dtr at bedside, call light within reach.   Therapy Documentation Precautions:  Precautions Precautions: Fall,Back Precaution Booklet Issued: No Precaution Comments: rectal tube, trach collar, cortrak, TLSO Required Braces or Orthoses: Spinal Brace Spinal Brace: Thoracolumbosacral orthotic,Applied in sitting position Restrictions Weight Bearing Restrictions: No    Therapy/Group: Individual Therapy  Karoline Caldwell, PT, DPT, CBIS  10/21/2020, 7:36 AM

## 2020-10-21 NOTE — Progress Notes (Signed)
PROGRESS NOTE   Subjective/Complaints:  Family worried about K+ level high normal range, flexiseal removed today  Due to reduced stooling Spoke with daughter regarding family concerns ROS: Patient denies CP, SOB, N/V/D     Objective:   DG Swallowing Func-Speech Pathology  Result Date: 10/19/2020 Objective Swallowing Evaluation: Type of Study: MBS-Modified Barium Swallow Study  Patient Details Name: Devin [name] MRN: [id number] Date of Birth: 02-May-1941 Today's Date: 10/19/2020 Past Medical History: Past Medical History: Diagnosis Date . A-fib ([place])  . Adenocarcinoma of left lung, stage 1 ([place]) 03/10/2015 . Ankylosing spondylitis ([place])   Diagnosed during lumbar fracture summer of 2016   . Crohn's disease ([place])  . Gout  . HIstory of basal cell cancer of face   THYROID CA HX . History of kidney stones  . Hypertension  . Hypothyroidism  . Impotence  . 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 Obesity (BMI 30-39.9)  . Osteoporosis   Pt completed 5 years of fosamax in 2013    . Prostate cancer with recurrence   Treated with prostatectomy with recurrence 2012 with Lupron treatment now him  . Psoriasis  . Thyroid cancer ([place]) 1999  Treated with RAI and total thyroidectomy  . Type II diabetes mellitus ([place])  Past Surgical History: Past Surgical History: Procedure Laterality Date . ABDOMINAL EXPLORATION SURGERY    for small bowel obstruction . APPENDECTOMY  03/2007 . BACK SURGERY   . BASAL CELL CARCINOMA EXCISION  "several"  "head" . CARDIAC CATHETERIZATION  03/17/2003 . CHOLECYSTECTOMY N/A 05/07/2016  Procedure: LAPAROSCOPIC CHOLECYSTECTOMY;  Surgeon: Fanny Skates, MD;  Location: [facility];  Service: General;  Laterality: N/A; . COLON SURGERY  03/2007  Resection of cecum, appendix, terminal ileum (approximately/notes 10/10/2010 . CYSTOSCOPY/URETEROSCOPY/HOLMIUM LASER/STENT PLACEMENT Right 07/03/2020  Procedure:  CYSTOSCOPY/RETROGRADE/URETEROSCOPY REMOVAL OF BLADDER STONE;  Surgeon: Raynelle Bring, MD;  Location: [facility];  Service: Urology;  Laterality: Right; . DIRECT LARYNGOSCOPY N/A 10/04/2020  Procedure: DIRECT LARYNGOSCOPY, PHARYNGOSCOPY WITH BIOPSY;  Surgeon: [name], DO;  Location: [facility];  Service: ENT;  Laterality: N/A; . ESOPHAGOSCOPY N/A 10/04/2020  Procedure: ESOPHAGOSCOPY WITH BIOPSY;  Surgeon: [name], DO;  Location: [facility];  Service: ENT;  Laterality: N/A; . HERNIA REPAIR   . LAMINECTOMY WITH POSTERIOR LATERAL ARTHRODESIS LEVEL 1 N/A 09/15/2020  Procedure: REVISION OF THORACOLUMBAR FUSION, ADDITION OF CROSS-CONNECTORS;  Surgeon: Eustace Moore, MD;  Location: [facility];  Service: Neurosurgery;  Laterality: N/A; . LAPAROSCOPIC CHOLECYSTECTOMY  05/07/2016 . LAPAROSCOPIC LYSIS OF ADHESIONS  05/07/2016 . LAPAROSCOPIC LYSIS OF ADHESIONS N/A 05/07/2016  Procedure: LAPAROSCOPIC LYSIS OF ADHESIONS TIMES ONE HOUR;  Surgeon: Fanny Skates, MD;  Location: [facility];  Service: General;  Laterality: N/A; . POSTERIOR FUSION THORACIC SPINE  02/08/2016  1. Posterior thoracic arthrodesis T7-T11 utilizing morcellized allograft, 2. Posterior thoracic segmental fixation T7-T11 utilizing nuvasive pedicle screws . PROSTATECTOMY  06/2001  w/bilateral pelvic lymph nose dissection/notes 10/24/2010 . SPINAL FUSION  12/2014  Open reduction internal fixation of L1 Chance fracture with posterior fusion T10-L4 utilizing morcellized allograft and some local autograft, segmental instrumentation T10-L4 inclusive utilizing nuvasive pedicle screws/notes 12/16/2014 . Stress Cardiolite  02/17/2003 . THOROCOTOMY WITH LOBECTOMY  03/16/2015  Procedure: THOROCOTOMY WITH LOBECTOMY;  Surgeon: Ivin Poot, MD;  Location: [facility];  Service: Thoracic;; . TONSILLECTOMY   . TOTAL THYROIDECTOMY  1997 . Venous Doppler  05/30/2004 . VENTRAL HERNIA REPAIR  04/14/2008 . VIDEO ASSISTED THORACOSCOPY Left 03/16/2015  Procedure: VIDEO ASSISTED THORACOSCOPY;   Surgeon: Ivin Poot, MD;  Location: [facility];  Service: Thoracic;  Laterality: Left; HPI: See H&P  Subjective: alert Assessment / Plan / Recommendation CHL IP CLINICAL IMPRESSIONS 10/19/2020 Clinical Impression Patient demonstrates minimal functional change since initial MBS on 09/29/20. Patient continues to present with a severe pharyngeal dysphagia that appears related to a thickening of the preverterbral tissue at the level of C4-C5.  This results in decreased epiglottic closure and redirects the bolus into the laryngeal vestibule resulting in penetration and eventual aspiration. With PMV in place and extra time and effort, the patient had a moderately strong cough response and ejected most of the barium effectively; however, it tended to fall immediately back into the larynx. Postural changes were not effective in reducing penetration or aspiration.  Discussed results with Dr. Naaman Plummer. Recommend continue NPO and suspect need for long-term alternative means of nutrition.  SLP will continue to follow. SLP Visit Diagnosis Dysphagia, oropharyngeal phase (R13.12) Attention and concentration deficit following -- Frontal lobe and executive function deficit following -- Impact on safety and function Severe aspiration risk   CHL IP TREATMENT RECOMMENDATION 10/19/2020 Treatment Recommendations Therapy as outlined in treatment plan below   Prognosis 10/19/2020 Prognosis for Safe Diet Advancement Fair Barriers to Reach Goals -- Barriers/Prognosis Comment -- CHL IP DIET RECOMMENDATION 10/19/2020 SLP Diet Recommendations NPO;Alternative means - long-term Liquid Administration via -- Medication Administration Via alternative means Compensations -- Postural Changes --   CHL IP OTHER RECOMMENDATIONS 10/19/2020 Recommended Consults -- Oral Care Recommendations Oral care QID Other Recommendations --   CHL IP FOLLOW UP RECOMMENDATIONS 10/19/2020 Follow up Recommendations Inpatient Rehab   CHL IP FREQUENCY AND DURATION 10/19/2020 Speech  Therapy Frequency (ACUTE ONLY) min 3x week Treatment Duration 3 weeks      CHL IP ORAL PHASE 10/19/2020 Oral Phase Impaired Oral - Pudding Teaspoon -- Oral - Pudding Cup -- Oral - Honey Teaspoon -- Oral - Honey Cup -- Oral - Nectar Teaspoon Holding of bolus;Delayed oral transit Oral - Nectar Cup -- Oral - Nectar Straw -- Oral - Thin Teaspoon -- Oral - Thin Cup -- Oral - Thin Straw -- Oral - Puree -- Oral - Mech Soft -- Oral - Regular -- Oral - Multi-Consistency -- Oral - Pill -- Oral Phase - Comment --  CHL IP PHARYNGEAL PHASE 10/19/2020 Pharyngeal Phase Impaired Pharyngeal- Pudding Teaspoon -- Pharyngeal -- Pharyngeal- Pudding Cup -- Pharyngeal -- Pharyngeal- Honey Teaspoon -- Pharyngeal -- Pharyngeal- Honey Cup -- Pharyngeal -- Pharyngeal- Nectar Teaspoon Delayed swallow initiation-vallecula;Reduced epiglottic inversion;Reduced airway/laryngeal closure;Penetration/Aspiration during swallow;Penetration/Apiration after swallow;Moderate aspiration;Pharyngeal residue - pyriform;Pharyngeal residue - valleculae Pharyngeal -- Pharyngeal- Nectar Cup -- Pharyngeal -- Pharyngeal- Nectar Straw -- Pharyngeal -- Pharyngeal- Thin Teaspoon -- Pharyngeal -- Pharyngeal- Thin Cup -- Pharyngeal -- Pharyngeal- Thin Straw -- Pharyngeal -- Pharyngeal- Puree NT Pharyngeal -- Pharyngeal- Mechanical Soft -- Pharyngeal -- Pharyngeal- Regular -- Pharyngeal -- Pharyngeal- Multi-consistency -- Pharyngeal -- Pharyngeal- Pill -- Pharyngeal -- Pharyngeal Comment --  No flowsheet data found. PAYNE, COURTNEY 10/19/2020, 10:00 AM      Weston Anna, MA, CCC-SLP [phone]         No results for input(s): WBC, HGB, HCT, PLT in the last 72 hours. Recent Labs  10/19/20 0719  NA 129*  K 5.1  CL 89*  CO2 35*  GLUCOSE 117*  BUN 48*  CREATININE 1.10  CALCIUM 8.8*    Intake/Output Summary (Last 24 hours) at 10/21/2020 1093 Last data filed at 10/21/2020 0500 Gross per 24 hour  Intake 2073.75 ml  Output 1350 ml  Net 723.75 ml     Pressure  Injury 09/12/20 Back Mid;Lower Stage 1 -  Intact skin with non-blanchable redness of a localized area usually over a bony prominence. Blister-like skin breakdown from patients TSLO brace (Active)  09/12/20 2000  Location: Back  Location Orientation: Mid;Lower  Staging: Stage 1 -  Intact skin with non-blanchable redness of a localized area usually over a bony prominence.  Wound Description (Comments): Blister-like skin breakdown from patients TSLO brace  Present on Admission: Yes    Physical Exam: Vital Signs Blood pressure (!) 120/50, pulse 69, temperature 97.6 F (36.4 C), temperature source Oral, resp. rate 18, weight 86 kg, SpO2 98 %.     General: No acute distress Mood and affect are appropriate Heart: Regular rate and rhythm no rubs murmurs or extra sounds Lungs: Clear to auscultation, breathing unlabored, no rales or wheezes Abdomen: Positive bowel sounds, soft nontender to palpation, nondistended Extremities: No clubbing, cyanosis, or edema   Skin:  Scattered bruises on UE's. Back incision CDI, abrasion/redness alongside of incision Neuro: awake, alert and appropriate.  No focal CN abnl except for weak cough. Reflexes are 1+ in all 4's.. UE grossly 3/5 prox to distal. LE: 2/5 HF, KE and 3/5 ADF/APF no motor changes today. Senses LT/P in both LE's Musculoskeletal: LB TTP   Assessment/Plan: 1. Functional deficits which require 3+ hours per day of interdisciplinary therapy in a comprehensive inpatient rehab setting.  Physiatrist is providing close team supervision and 24 hour management of active medical problems listed below.  Physiatrist and rehab team continue to assess barriers to discharge/monitor patient progress toward functional and medical goals  Care Tool:  Bathing    Body parts bathed by patient: Right arm,Left arm,Chest,Abdomen,Face,Right upper leg,Left upper leg   Body parts bathed by helper: Front perineal area,Buttocks,Right lower leg,Left lower leg      Bathing assist Assist Level: Moderate Assistance - Patient 50 - 74%     Upper Body Dressing/Undressing Upper body dressing   What is the patient wearing?: Hospital gown only    Upper body assist Assist Level: Moderate Assistance - Patient 50 - 74%    Lower Body Dressing/Undressing Lower body dressing      What is the patient wearing?: Incontinence brief     Lower body assist Assist for lower body dressing: Total Assistance - Patient < 25%     Toileting Toileting    Toileting assist Assist for toileting: 2 Helpers     Transfers Chair/bed transfer  Transfers assist  Chair/bed transfer activity did not occur: Safety/medical concerns  Chair/bed transfer assist level: Minimal Assistance - Patient > 75%     Locomotion Ambulation   Ambulation assist      Assist level: Contact Guard/Touching assist Assistive device: Parallel bars Max distance: 20'   Walk 10 feet activity   Assist  Walk 10 feet activity did not occur: Safety/medical concerns  Assist level: Contact Guard/Touching assist Assistive device: Parallel bars   Walk 50 feet activity   Assist Walk 50 feet with 2 turns activity did not occur: Safety/medical concerns         Walk 150 feet activity   Assist Walk 150  feet activity did not occur: Safety/medical concerns         Walk 10 feet on uneven surface  activity   Assist Walk 10 feet on uneven surfaces activity did not occur: Safety/medical concerns         Wheelchair     Assist Will patient use wheelchair at discharge?: No             Wheelchair 50 feet with 2 turns activity    Assist            Wheelchair 150 feet activity     Assist          Blood pressure (!) 120/50, pulse 69, temperature 97.6 F (36.4 C), temperature source Oral, resp. rate 18, weight 86 kg, SpO2 98 %.  Medical Problem List and Plan: 1.  T11/12 compression fx s/p posterior fusion secondary to fall with debility due to asp  PNA/trach/generalzied weakness             -patient may not shower             -ELOS/Goals: 2-3 weeks- min A  -Continue CIR therapies including PT, OT, and SLP  2.  Antithrombotics: -DVT/anticoagulation:  Pharmaceutical: Other (comment)--Apixaban bid.              -antiplatelet therapy: N/A 3. Pain Management:  hydrocodone prn for severe pain  - Tramadol prn for moderate pain.  5/10 - pain controlled- con't regimen   4. Mood/anxiety/sleep: LCSW to follow for evaluation and support.              -antipsychotic agents: N/A  -xanax bid prn anxiety  -continue trazodone for sleep 5. Neuropsych: This patient is not fully capable of making decisions on his own behalf. 6. Skin/Wound Care: Pressure relief measures.  7. Fluids/Electrolytes/Nutrition: NPO with tube feeds for nutritional support.             5/12--BUN/Cr up sl --increased flushes   -K+ 5.1   Borderline hyperkalemia, stooling has diminished will check with RN re flexiseal output, may need to reduce scheduled immodium, has chronic diarrhea due to Crohn's disease 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               --?inflammatory subglottic mucosal, negative for malignancy             --chest PT, sx, OOB  -vest to help with secretions  5/11 secretions better. PCCM following   -downsize week of 5/23 to coincide with f/u bronch 9. Acute on chronic respiratory failure: Encourage pulmonary hygiene              --continue Pulmicort nebs bid.  10.  PAF:  Monitor HR tid--continue Atenolol bid with Cardizem tid.   -HR controlled 5/9 10. Hyponatremia: Has resolved  11. Acute blood loss anemia, anemia chronic disease: Monitor with serial checks.   -hgb 8.4 5/6 12. Leukocytosis: WBC continues to fluctuate.   -no new signs of infection  -5/6 wbc 13.5.  5/9 wbc's down to 9.8 13. Crohn's disease:    - once able to swallow pills, can try adding home Crohn's meds to help.     -continue florastor and questran  5/13 -will discuss rectal tube with pt/wife today when we meet   -have increased questran to qid   -will TRY to pour granules from mesalamine capsule into syringe for injection into NGT. Not sure it will work, but stool continues to be  completely liquid in form. Wife understands the dilemma here.  14.  Bilateral pleural effusion: Pulmonary hygiene w/ flutter valve.              --continue to monitor for symptoms with increase in activity.   15. Aspiration PNA: zosyn completed 5/5 16. Dysphagia: Continue NPO with Glucerna               -MBS 5/12 per SLP demonstrated no improvement in peri-glottic mass/area nor improvement in swallow  -he is going to need PEG--discussed it at length with pt and wife today. They'll think about it over the weekend.  Will contact IR Monday if they're ready to move forward.   -PCCM in agreement with PEG 17. T2DM: Was on Humalog tid prior to admission.             --will monitor BS every 4 hours as on tube feeds and use SSI prn    CBG (last 3)  Recent Labs    10/20/20 2343 10/21/20 0406 10/21/20 0804  GLUCAP 149* 158* 162*  5/14 controlled  LOS: 9 days A FACE TO FACE EVALUATION WAS PERFORMED  Luanna Salk Kimmie Doren 10/21/2020, 9:23 AM

## 2020-10-21 NOTE — Progress Notes (Signed)
Called by RN that patient had desat to low 80's. Upon arrival RN and family were in room. Patient SPO2 was mid 80's but no distress. Good wave form on monitor. Inner cannula was assessed and clean, patient was suctioned and received minimal secretions, no difficulty passing the catheter. BBS were clear, diminished. Patient able to cough up secretions orally. SPO2 still remained in the 80's. Patient was switched from Room Air ATC to 5L 28% oxygen ATC. Patient SPO2 came up to 95%. Patient in no distress. RT will monitor. Family at bedside.

## 2020-10-21 NOTE — Progress Notes (Signed)
Speech Language Pathology Daily Session Note  Patient Details  Name: Devin [name] MRN: [id number] Date of Birth: 11/09/1940  Today's Date: 10/21/2020 SLP Individual Time: 1415-1445 SLP Individual Time Calculation (min): 30 min  Short Term Goals: Week 2: SLP Short Term Goal 1 (Week 2): Patient will initiate verbal responses vs use of gestures for communication with PMSV in place in 75% of opportunities with Mod verbal cues. SLP Short Term Goal 2 (Week 2): Patient will utilize an increased vocal intensity at the phrase level to achieve ~90% intelligibility with Mod A verbal cues. SLP Short Term Goal 3 (Week 2): Patient will recall swallowing and safety precautions for trials of ice chips wth Min A multimodal cues. SLP Short Term Goal 4 (Week 2): Patient will utilize external memory aids to recall new, daily information with supervision level verbal cues.  Skilled Therapeutic Interventions:Skilled ST services focused on cognitive skills. SLP attempted to facilitate assessment of cognitive skills, however was limited due to constant need to remove oral expectorant. Pt demonstrated mod I use of oral suction to remove oral secretions, following productive cough/throat clearing with PMSV and 5 liters of O2 in place. Pt's oxygen declined to as low as 80% during the events, however quickly rose to mid 90s immediately after. Pt completed a few subsections of Cognistat and verbal problem solving from ALFA. Pt demonstrated WFL on selective attention, mental calculations and judgement, with mild deficits in listing similarities and moderate-severe deficits in delayed recall. Pt was able to recall 1 out 4 words and 3 out 4 word with 3 choices. Pt was able to accurately answer mildly complex mental math problems from ALFA in 2 out 4 opportunities. Pt was left in room with call bell within reach and bed alarm set. SLP recommends to continue skilled services.     Pain Pain Assessment Pain Scale: Faces Pain  Score: 0-No pain Faces Pain Scale: Hurts a little bit  Therapy/Group: Individual Therapy  Davonta Stroot  [facility] 10/21/2020, 4:11 PM

## 2020-10-22 ENCOUNTER — Inpatient Hospital Stay (HOSPITAL_COMMUNITY): Payer: Medicare Other

## 2020-10-22 LAB — GLUCOSE, CAPILLARY
Glucose-Capillary: 134 mg/dL — ABNORMAL HIGH (ref 70–99)
Glucose-Capillary: 164 mg/dL — ABNORMAL HIGH (ref 70–99)
Glucose-Capillary: 164 mg/dL — ABNORMAL HIGH (ref 70–99)
Glucose-Capillary: 165 mg/dL — ABNORMAL HIGH (ref 70–99)
Glucose-Capillary: 168 mg/dL — ABNORMAL HIGH (ref 70–99)
Glucose-Capillary: 200 mg/dL — ABNORMAL HIGH (ref 70–99)

## 2020-10-22 MED ORDER — LOPERAMIDE HCL 1 MG/7.5ML PO SUSP
1.0000 mg | Freq: Four times a day (QID) | ORAL | Status: DC
  Administered 2020-10-22 – 2020-10-30 (×29): 1 mg
  Filled 2020-10-22 (×40): qty 7.5

## 2020-10-22 NOTE — Progress Notes (Addendum)
PROGRESS NOTE   Subjective/Complaints:  Notified by RN about increasing O2 needs  Pt coughing up some sputum, appears to be saliva, no leg swelling noted per pt Pt sttes he didnot have BM today  Had medium BM yesterday  ROS: Patient denies CP, SOB, N/V/D     Objective:   No results found. No results for input(s): WBC, HGB, HCT, PLT in the last 72 hours. Recent Labs    10/21/20 0958  NA 127*  K 5.1  CL 89*  CO2 31  GLUCOSE 134*  BUN 45*  CREATININE 1.03  CALCIUM 8.5*    Intake/Output Summary (Last 24 hours) at 10/22/2020 0951 Last data filed at 10/22/2020 0600 Gross per 24 hour  Intake 3225 ml  Output 2950 ml  Net 275 ml        Physical Exam: Vital Signs Blood pressure (!) 103/44, pulse 65, temperature 98.2 F (36.8 C), temperature source Oral, resp. rate 16, weight 85 kg, SpO2 96 %.  General: No acute distress Mood and affect are appropriate Heart: Regular rate and rhythm no rubs murmurs or extra sounds Lungs: Clear to auscultation, breathing unlabored,no resp distress, no wheezes Abdomen: Positive bowel sounds, soft mild diffuse tenderness to palpation, nondistended Extremities: No clubbing, cyanosis, or edema   Skin:  Scattered bruises on UE's. Back incision CDI, abrasion/redness alongside of incision Neuro: awake, alert and appropriate.  No focal CN abnl except for weak cough. Reflexes are 1+ in all 4's.. UE grossly 3/5 prox to distal. LE: 2/5 HF, KE and 3/5 ADF/APF no motor changes today. Senses LT/P in both LE's Musculoskeletal: LB TTP   Assessment/Plan: 1. Functional deficits which require 3+ hours per day of interdisciplinary therapy in a comprehensive inpatient rehab setting.  Physiatrist is providing close team supervision and 24 hour management of active medical problems listed below.  Physiatrist and rehab team continue to assess barriers to discharge/monitor patient progress toward  functional and medical goals  Care Tool:  Bathing    Body parts bathed by patient: Right arm,Left arm,Chest,Abdomen,Face,Right upper leg,Left upper leg   Body parts bathed by helper: Front perineal area,Buttocks,Right lower leg,Left lower leg     Bathing assist Assist Level: Moderate Assistance - Patient 50 - 74%     Upper Body Dressing/Undressing Upper body dressing   What is the patient wearing?: Hospital gown only    Upper body assist Assist Level: Moderate Assistance - Patient 50 - 74%    Lower Body Dressing/Undressing Lower body dressing      What is the patient wearing?: Incontinence brief     Lower body assist Assist for lower body dressing: Total Assistance - Patient < 25%     Toileting Toileting    Toileting assist Assist for toileting: 2 Helpers     Transfers Chair/bed transfer  Transfers assist  Chair/bed transfer activity did not occur: Safety/medical concerns  Chair/bed transfer assist level: Minimal Assistance - Patient > 75%     Locomotion Ambulation   Ambulation assist      Assist level: Contact Guard/Touching assist Assistive device: Parallel bars Max distance: 20'   Walk 10 feet activity   Assist  Walk 10 feet activity did  not occur: Safety/medical concerns  Assist level: Contact Guard/Touching assist Assistive device: Parallel bars   Walk 50 feet activity   Assist Walk 50 feet with 2 turns activity did not occur: Safety/medical concerns         Walk 150 feet activity   Assist Walk 150 feet activity did not occur: Safety/medical concerns         Walk 10 feet on uneven surface  activity   Assist Walk 10 feet on uneven surfaces activity did not occur: Safety/medical concerns         Wheelchair     Assist Will patient use wheelchair at discharge?: No             Wheelchair 50 feet with 2 turns activity    Assist            Wheelchair 150 feet activity     Assist           Blood pressure (!) 103/44, pulse 65, temperature 98.2 F (36.8 C), temperature source Oral, resp. rate 16, weight 85 kg, SpO2 96 %.  Medical Problem List and Plan: 1.  T11/12 compression fx s/p posterior fusion secondary to fall with debility due to asp PNA/trach/generalzied weakness             -patient may not shower             -ELOS/Goals: 2-3 weeks- min A  -Continue CIR therapies including PT, OT, and SLP  2.  Antithrombotics: -DVT/anticoagulation:  Pharmaceutical: Other (comment)--Apixaban bid.              -antiplatelet therapy: N/A 3. Pain Management:  hydrocodone prn for severe pain  - Tramadol prn for moderate pain.  5/10 - pain controlled- con't regimen   4. Mood/anxiety/sleep: LCSW to follow for evaluation and support.              -antipsychotic agents: N/A  -xanax bid prn anxiety  -continue trazodone for sleep 5. Neuropsych: This patient is not fully capable of making decisions on his own behalf. 6. Skin/Wound Care: Pressure relief measures.  7. Fluids/Electrolytes/Nutrition: NPO with tube feeds for nutritional support.             5/12--BUN/Cr up sl --increased flushes   -K+ 5.1   Borderline hyperkalemia, stooling has diminished will check with RN re flexiseal output, may need to reduce scheduled immodium, has chronic diarrhea due to Crohn's disease- repeat BMET in am  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               --?inflammatory subglottic mucosal, negative for malignancy             --chest PT, sx, OOB  -vest to help with secretions  5/11 secretions better. PCCM following   -downsize week of 5/23 to coincide with f/u bronch 9. Acute on chronic respiratory failure: Encourage pulmonary hygiene              --continue Pulmicort nebs bid.  10.  PAF:  Monitor HR tid--continue Atenolol bid with Cardizem tid.   -HR controlled 5/9 10. Hyponatremia: Has resolved  11. Acute blood loss anemia, anemia  chronic disease: Monitor with serial checks.   -hgb 8.4 5/6 12. Leukocytosis: WBC continues to fluctuate.   -no new signs of infection  -5/6 wbc 13.5.  5/9 wbc's down to 9.8 13. Crohn's disease:    - once able to swallow pills, can try adding  home Crohn's meds to help.    -continue florastor and Lucrezia Starch  5/13 -will discuss rectal tube with pt/wife today when we meet   -have increased questran to qid   -will TRY to pour granules from mesalamine capsule into syringe for injection into NGT. Not sure it will work, but stool continues to be completely liquid in form. Wife understands the dilemma here.  14.  Bilateral pleural effusion: Pulmonary hygiene w/ flutter valve.              --continue to monitor for symptoms with increase in activity.   15. Aspiration PNA: zosyn completed 5/5 16. Dysphagia: Continue NPO with Glucerna               -MBS 5/12 per SLP demonstrated no improvement in peri-glottic mass/area nor improvement in swallow  -he is going to need PEG--discussed it at length with pt and wife today. They'll think about it over the weekend.  Will contact IR Monday if they're ready to move forward.   -PCCM in agreement with PEG 17. T2DM: Was on Humalog tid prior to admission.             --will monitor BS every 4 hours as on tube feeds and use SSI prn    CBG (last 3)  Recent Labs    10/21/20 2358 10/22/20 0408 10/22/20 0806  GLUCAP 158* 164* 165*  5/15 controlled 18.  Hypoxia multifactorial LUL lobectomy for lung CA ,O2 increased from 28% to 35% last noc  recent PNA- repeat CXR 2V CXR unnchaged  LOS: 10 days A FACE TO [place] E Gregery Walberg 10/22/2020, 9:51 AM

## 2020-10-22 NOTE — Progress Notes (Signed)
Noted patient O2 saturation @ 90-91%. Patient resting, denies shortness of breath with clear, diminished breath sounds bilaterally L>R. Patient able to cough up oral secretions but refused to be suctioned via trache. RT informed and came to bedside who suctioned trache with minimal secretion obtained. O2 sat up to 92-93%. RT increased O2 to 35%. SPO2 reading at 95%.

## 2020-10-23 DIAGNOSIS — R195 Other fecal abnormalities: Secondary | ICD-10-CM

## 2020-10-23 DIAGNOSIS — R1314 Dysphagia, pharyngoesophageal phase: Secondary | ICD-10-CM

## 2020-10-23 DIAGNOSIS — S22080S Wedge compression fracture of T11-T12 vertebra, sequela: Secondary | ICD-10-CM

## 2020-10-23 LAB — GLUCOSE, CAPILLARY
Glucose-Capillary: 159 mg/dL — ABNORMAL HIGH (ref 70–99)
Glucose-Capillary: 156 mg/dL — ABNORMAL HIGH (ref 70–99)
Glucose-Capillary: 152 mg/dL — ABNORMAL HIGH (ref 70–99)
Glucose-Capillary: 167 mg/dL — ABNORMAL HIGH (ref 70–99)
Glucose-Capillary: 167 mg/dL — ABNORMAL HIGH (ref 70–99)
Glucose-Capillary: 178 mg/dL — ABNORMAL HIGH (ref 70–99)

## 2020-10-23 LAB — CBC WITH DIFFERENTIAL/PLATELET
Abs Immature Granulocytes: 0.08 10*3/uL — ABNORMAL HIGH (ref 0.00–0.07)
Basophils Absolute: 0 10*3/uL (ref 0.0–0.1)
Basophils Relative: 0 %
Eosinophils Absolute: 0.1 10*3/uL (ref 0.0–0.5)
Eosinophils Relative: 1 %
HCT: 30.9 % — ABNORMAL LOW (ref 39.0–52.0)
Hemoglobin: 9.4 g/dL — ABNORMAL LOW (ref 13.0–17.0)
Immature Granulocytes: 1 %
Lymphocytes Relative: 3 %
Lymphs Abs: 0.5 10*3/uL — ABNORMAL LOW (ref 0.7–4.0)
MCH: 30 pg (ref 26.0–34.0)
MCHC: 30.4 g/dL (ref 30.0–36.0)
MCV: 98.7 fL (ref 80.0–100.0)
Monocytes Absolute: 1 10*3/uL (ref 0.1–1.0)
Monocytes Relative: 7 %
Neutro Abs: 13.1 10*3/uL — ABNORMAL HIGH (ref 1.7–7.7)
Neutrophils Relative %: 88 %
Platelets: 263 10*3/uL (ref 150–400)
RBC: 3.13 MIL/uL — ABNORMAL LOW (ref 4.22–5.81)
RDW: 16.3 % — ABNORMAL HIGH (ref 11.5–15.5)
WBC: 14.7 10*3/uL — ABNORMAL HIGH (ref 4.0–10.5)
nRBC: 0 % (ref 0.0–0.2)

## 2020-10-23 LAB — BASIC METABOLIC PANEL
Anion gap: 7 (ref 5–15)
BUN: 42 mg/dL — ABNORMAL HIGH (ref 8–23)
CO2: 32 mmol/L (ref 22–32)
Calcium: 9.1 mg/dL (ref 8.9–10.3)
Chloride: 90 mmol/L — ABNORMAL LOW (ref 98–111)
Creatinine, Ser: 0.99 mg/dL (ref 0.61–1.24)
GFR, Estimated: >60 mL/min (ref >60)
Glucose, Bld: 195 mg/dL — ABNORMAL HIGH (ref 70–99)
Potassium: 5.1 mmol/L (ref 3.5–5.1)
Sodium: 129 mmol/L — ABNORMAL LOW (ref 135–145)

## 2020-10-23 LAB — BLOOD GAS, ARTERIAL
Acid-Base Excess: 5.2 mmol/L — ABNORMAL HIGH (ref 0.0–2.0)
Bicarbonate: 30.4 mmol/L — ABNORMAL HIGH (ref 20.0–28.0)
Drawn by: 548791
FIO2: 28
O2 Saturation: 90.3 %
Patient temperature: 37
pCO2 arterial: 55.6 mmHg — ABNORMAL HIGH (ref 32.0–48.0)
pH, Arterial: 7.357 (ref 7.350–7.450)
pO2, Arterial: 59.4 mmHg — ABNORMAL LOW (ref 83.0–108.0)

## 2020-10-23 MED ORDER — ACETAMINOPHEN 160 MG/5ML PO SOLN
325.0000 mg | ORAL | Status: DC | PRN
  Administered 2020-10-24 – 2020-10-29 (×3): 650 mg
  Filled 2020-10-23 (×4): qty 20.3

## 2020-10-23 NOTE — Progress Notes (Signed)
Physical Therapy Session Note  Patient Details  Name: Devin [name] MRN: [id number] Date of Birth: 12-Sep-1940  Today's Date: 10/23/2020 PT Individual Time: 0932-1030 PT Individual Time Calculation (min): 58 min   Short Term Goals: Week 2:  PT Short Term Goal 1 (Week 2): Pt will perform bed mobility consistently with CGA. PT Short Term Goal 2 (Week 2): Pt will perform bed to chair consistently with CGA. PT Short Term Goal 3 (Week 2): Pt will ambulate 37' with minA and LRAD.  Skilled Therapeutic Interventions/Progress Updates: Pt presents semi-reclined in bed and agreeable to therapy.  Pt performed LE there ex while waiting for anxiety meds.  Pt performed AP, HS, abd/add 3 x 15.  Pt transferred sup to sit utilizing log roll and side rail and min A.  Pt sat EOB w/o assist, Total A for donning TLSO w/ daughter present and assisting w/ education.  Pt transferred sit to stand w/ min A and verbal cues for hand placement.  Pt transferred bed to w/c w/ RW and min A.  Pt wheeled to main gym for time conservation.  Pt amb x 3 trials in // bars forward and back 5' w/ min to CGA.  Pt cued to use arm rests to stand .  Pt returned to room and amb from doorway to bed (10') w/ RW and min A.  Pt required total A to doff TLSO.  Pt transferred sit to sidelying w/ min A for LEs and then rolls to supine.  Pt assisted w/ positioning in bed.  Bed alarm on and all needs in reach.  Daughter present.     Therapy Documentation Precautions:  Precautions Precautions: Fall,Back Precaution Booklet Issued: No Precaution Comments: rectal tube, trach collar, cortrak, TLSO Required Braces or Orthoses: Spinal Brace Spinal Brace: Thoracolumbosacral orthotic,Applied in sitting position Restrictions Weight Bearing Restrictions: No General:   Vital Signs: Therapy Vitals Pulse Rate: 74 Resp: 18 Patient Position (if appropriate): Lying Oxygen Therapy SpO2: 96 % O2 Device: Tracheostomy Collar O2 Flow Rate (L/min): 5  L/min FiO2 (%): 28 % Pain:4-5/10 back w/ pain meds given Pain Assessment Pain Scale: 0-10 Pain Score: 0-No pain Mobility:      Therapy/Group: Individual Therapy  [name] 10/23/2020, 11:54 AM

## 2020-10-23 NOTE — Plan of Care (Signed)
  Problem: RH Simple Meal Prep Goal: LTG Patient will perform simple meal prep w/assist (OT) Description: LTG: Patient will perform simple meal prep with assistance, with/without cues (OT). Outcome: Not Applicable Flowsheets (Taken 10/23/2020 0951) LTG: Pt will perform simple meal prep with assistance level of: (n/a dc goal 5/16) -- Note: D/c goal, slow progress -ESD 5/16

## 2020-10-23 NOTE — Progress Notes (Signed)
Occupational Therapy Session Note  Patient Details  Name: Devin [name] MRN: [id number] Date of Birth: [birth year]  Today's Date: 10/23/2020 OT Individual Time: 5449-2010 OT Individual Time Calculation (min): 57 min   Short Term Goals: Week 2:  OT Short Term Goal 1 (Week 2): Pt will complete bathing with mod assist at sit > stand level OT Short Term Goal 2 (Week 2): Pt will don shirt with min assist OT Short Term Goal 3 (Week 2): Pt will complete toilet transfer with min A and LRAD  Skilled Therapeutic Interventions/Progress Updates:    Pt greeted semi-reclined in bed with nursing present. Pt agreeable to OT treatment session with encouragement. Pt reported swelling in LEs, found swelling in his feet. OT donned TED hose and socks, then pt agreeable to sit up. Pt with new catheter for bladder, but some BM in brief. Mod A rolling for total A peri-care and brief change. Pt completed bed mobility with mod A. Worked on donning pants using reacher and mod A. Sit<>stand at EOB with RW and min. Pt with multiple interruptions from nursing staff, pulmonary, and respiratory. Worked together while pt sitting up to receive breathing treatment. Pt returned to bed at end of session with min A. Pt left semi-reclined in bed with suction close and needs met.    Therapy Documentation Precautions:  Precautions Precautions: Fall,Back Precaution Booklet Issued: No Precaution Comments: rectal tube, trach collar, cortrak, TLSO Required Braces or Orthoses: Spinal Brace Spinal Brace: Thoracolumbosacral orthotic,Applied in sitting position Restrictions Weight Bearing Restrictions: No Pain:  No number given, reports back pain  Therapy/Group: Individual Therapy  [name] 10/23/2020, 8:09 AM

## 2020-10-23 NOTE — Consult Note (Signed)
Consultation to: Difficult Foley, urinary retention Requested by: Dr. Lynford Citizen  History of Present Illness: Devin [name] is a 80 year old male in hospital for T11/12 compression fx s/p posterior fusionsecondary to fall with debility due to asp PNA/trach/generalzied weakness.  He is requiring CIC and nurse could not perform CIC throughout the night.  They tried several different straight and coud catheters.  Bladder scan done in patient with 900 cc in his bladder and he is dribbling urine into a diaper.  Past Medical History:  Diagnosis Date  . A-fib ([place])   . 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 Ankylosing spondylitis ([place])    Diagnosed during lumbar fracture summer of 2016    . Crohn's disease ([place])   . Gout   . HIstory of basal cell cancer of face    THYROID CA HX  . History of kidney stones   . Hypertension   . Hypothyroidism   . Impotence   . 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 Obesity (BMI 30-39.9)   . Osteoporosis    Pt completed 5 years of fosamax in 2013     . Prostate cancer with recurrence    Treated with prostatectomy with recurrence 2012 with Lupron treatment now him   . Psoriasis   . Thyroid cancer ([place]) 1999   Treated with RAI and total thyroidectomy   . Type II diabetes mellitus ([place])    Past Surgical History:  Procedure Laterality Date  . ABDOMINAL EXPLORATION SURGERY     for small bowel obstruction  . APPENDECTOMY  03/2007  . BACK SURGERY    . BASAL CELL CARCINOMA EXCISION  "several"   "head"  . CARDIAC CATHETERIZATION  03/17/2003  . CHOLECYSTECTOMY N/A 05/07/2016   Procedure: LAPAROSCOPIC CHOLECYSTECTOMY;  Surgeon: Fanny Skates, MD;  Location: [facility];  Service: General;  Laterality: N/A;  . COLON SURGERY  03/2007   Resection of cecum, appendix, terminal ileum (approximately/notes 10/10/2010  . CYSTOSCOPY/URETEROSCOPY/HOLMIUM LASER/STENT PLACEMENT Right 07/03/2020   Procedure:  CYSTOSCOPY/RETROGRADE/URETEROSCOPY REMOVAL OF BLADDER STONE;  Surgeon: Raynelle Bring, MD;  Location: [facility];  Service: Urology;  Laterality: Right;  . DIRECT LARYNGOSCOPY N/A 10/04/2020   Procedure: DIRECT LARYNGOSCOPY, PHARYNGOSCOPY WITH BIOPSY;  Surgeon: [name], DO;  Location: [facility];  Service: ENT;  Laterality: N/A;  . ESOPHAGOSCOPY N/A 10/04/2020   Procedure: ESOPHAGOSCOPY WITH BIOPSY;  Surgeon: [name], DO;  Location: [facility];  Service: ENT;  Laterality: N/A;  . HERNIA REPAIR    . LAMINECTOMY WITH POSTERIOR LATERAL ARTHRODESIS LEVEL 1 N/A 09/15/2020   Procedure: REVISION OF THORACOLUMBAR FUSION, ADDITION OF CROSS-CONNECTORS;  Surgeon: Eustace Moore, MD;  Location: [facility];  Service: Neurosurgery;  Laterality: N/A;  . LAPAROSCOPIC CHOLECYSTECTOMY  05/07/2016  . LAPAROSCOPIC LYSIS OF ADHESIONS  05/07/2016  . LAPAROSCOPIC LYSIS OF ADHESIONS N/A 05/07/2016   Procedure: LAPAROSCOPIC LYSIS OF ADHESIONS TIMES ONE HOUR;  Surgeon: Fanny Skates, MD;  Location: [facility];  Service: General;  Laterality: N/A;  . POSTERIOR FUSION THORACIC SPINE  02/08/2016   1. Posterior thoracic arthrodesis T7-T11 utilizing morcellized allograft, 2. Posterior thoracic segmental fixation T7-T11 utilizing nuvasive pedicle screws  . PROSTATECTOMY  06/2001   w/bilateral pelvic lymph nose dissection/notes 10/24/2010  . SPINAL FUSION  12/2014   Open reduction internal fixation of L1 Chance fracture with posterior fusion T10-L4 utilizing morcellized allograft and some local autograft, segmental instrumentation T10-L4 inclusive utilizing nuvasive pedicle screws/notes 12/16/2014  . Stress Cardiolite  02/17/2003  .  THOROCOTOMY WITH LOBECTOMY  03/16/2015   Procedure: THOROCOTOMY WITH LOBECTOMY;  Surgeon: Ivin Poot, MD;  Location: [facility];  Service: Thoracic;;  . TONSILLECTOMY    . TOTAL THYROIDECTOMY  1997  . Venous Doppler  05/30/2004  . VENTRAL HERNIA REPAIR  04/14/2008  . VIDEO ASSISTED THORACOSCOPY Left  03/16/2015   Procedure: VIDEO ASSISTED THORACOSCOPY;  Surgeon: Ivin Poot, MD;  [facility];  Service: Thoracic;  Laterality: Left;    Home Medications:  Medications Prior to Admission  Medication Sig Dispense Refill Last Dose  . acetaminophen (TYLENOL) 160 MG/5ML solution Place 15.6 mLs (500 mg total) into feeding tube every 6 (six) hours. 120 mL 0   . acetylcysteine (MUCOMYST) 20 % nebulizer solution Take 3 mLs (600 mg total) by nebulization 2 (two) times daily. 30 mL 12   . albuterol (VENTOLIN HFA) 108 (90 Base) MCG/ACT inhaler Inhale 2 puffs into the lungs every 6 (six) hours as needed for wheezing or shortness of breath. 8 g 6   . ALPRAZolam (XANAX) 0.5 MG tablet Take 0.5 mg by mouth 2 (two) times daily as needed for anxiety.     Marland Kitchen apixaban (ELIQUIS) 5 MG TABS tablet Take 5 mg by mouth 2 (two) times daily.     Marland Kitchen atenolol (TENORMIN) 25 MG tablet Take 1 tablet (25 mg total) by mouth 2 (two) times daily.     . budesonide (PULMICORT) 0.5 MG/2ML nebulizer solution Take 2 mLs (0.5 mg total) by nebulization 2 (two) times daily.  12   . cholestyramine (QUESTRAN) 4 g packet Place 1 packet (4 g total) into feeding tube 2 (two) times daily before lunch and supper. 60 each 12   . diltiazem (CARDIZEM) 10 mg/ml oral suspension Place 3 mLs (30 mg total) into feeding tube every 6 (six) hours.     . diphenhydrAMINE (BENADRYL) 25 mg capsule 1 capsule (25 mg total) by Per NG tube route at bedtime as needed for sleep. 30 capsule 0   . fiber (NUTRISOURCE FIBER) PACK packet Place 1 packet into feeding tube 2 (two) times daily.     . insulin aspart (NOVOLOG) 100 UNIT/ML injection Inject 0-20 Units into the skin every 4 (four) hours. 10 mL 11   . insulin glargine (LANTUS) 100 UNIT/ML injection Inject 0.12 mLs (12 Units total) into the skin 2 (two) times daily. 10 mL 11   . lactobacillus (FLORANEX/LACTINEX) PACK Place 1 packet (1 g total) into feeding tube 3 (three) times daily with meals.     Marland Kitchen  levothyroxine (SYNTHROID) 125 MCG tablet Take 1 tablet (125 mcg total) by mouth daily before breakfast. 30 tablet 0   . loperamide HCl (IMODIUM) 1 MG/7.5ML suspension Place 15 mLs (2 mg total) into feeding tube 2 (two) times daily as needed for diarrhea or loose stools. 120 mL 0   . loperamide HCl (IMODIUM) 1 MG/7.5ML suspension Place 30 mLs (4 mg total) into feeding tube 4 (four) times daily. 120 mL 0   . nutrition supplement, JUVEN, (JUVEN) PACK Place 1 packet into feeding tube 2 (two) times daily between meals.  0   . Nutritional Supplements (FEEDING SUPPLEMENT, PROSOURCE TF,) liquid Place 45 mLs into feeding tube daily.     Marland Kitchen Nystatin (GERHARDT'S BUTT CREAM) CREA Apply 1 application topically 2 (two) times daily.     . ondansetron (ZOFRAN) 4 MG/2ML SOLN injection Inject 2 mLs (4 mg total) into the vein every 6 (six) hours as needed for nausea or vomiting. 2 mL  0   . oxyCODONE (ROXICODONE) 5 MG/5ML solution Place 5 mLs (5 mg total) into feeding tube every 4 (four) hours as needed for moderate pain.  0   . phenol (CHLORASEPTIC) 1.4 % LIQD Use as directed 1 spray in the mouth or throat as needed for throat irritation / pain.  0   . piperacillin-tazobactam (ZOSYN) 3.375 GM/50ML IVPB Inject 50 mLs (3.375 g total) into the vein every 8 (eight) hours. 50 mL    . tamsulosin (FLOMAX) 0.4 MG CAPS capsule Take 1 capsule (0.4 mg total) by mouth daily. 30 capsule    . Water For Irrigation, Sterile (FREE WATER) SOLN Place 100 mLs into feeding tube every 8 (eight) hours.      Allergies: No Known Allergies  Family History  Problem Relation Age of Onset  . CAD Mother   . Cancer Neg Hx        No cancer in the patient's immediate family, except of course for the patient himself, as noted   Social History:  reports that he quit smoking about 30 years ago. His smoking use included cigarettes. He has a 60.00 pack-year smoking history. He has never used smokeless tobacco. He reports previous alcohol use. He  reports that he does not use drugs.  ROS: A complete review of systems was performed.  All systems are negative except for pertinent findings as noted. Review of Systems  Unable to perform ROS: Other  Trach   Physical Exam:  Vital signs in last 24 hours: Temp:  [97.8 F (36.6 C)-98.5 F (36.9 C)] 98.5 F (36.9 C) (05/16 0414) Pulse Rate:  [61-78] 64 (05/16 0414) Resp:  [16-21] 21 (05/16 0414) BP: (109-129)/(52-66) 120/56 (05/16 0414) SpO2:  [93 %-100 %] 100 % (05/16 0414) FiO2 (%):  [28 %-35 %] 28 % (05/16 0319) General:  Alert and oriented, No acute distress HEENT: Normocephalic, atraumatic, trach dependent Cardiovascular: Regular rate and rhythm Lungs: Regular rate and effort Abdomen: Soft, nontender, nondistended, no abdominal masses, bladder mildly distended Back: No CVA tenderness Extremities: No edema Neurologic: Grossly intact GU: Penis circumcised and without mass or lesion.  Glans and meatus appeared normal.  Scrotum appeared normal.  Procedure: He was prepped and draped in the usual sterile fashion.  A 20 French coud catheter was placed without difficulty.  It was left to gravity drainage and drained about 900 cc of fluid.  Laboratory Data:  Results for orders placed or performed during the hospital encounter of 10/12/20 (from the past 24 hour(s))  Glucose, capillary     Status: Abnormal   Collection Time: 10/22/20  8:06 AM  Result Value Ref Range   Glucose-Capillary 165 (H) 70 - 99 mg/dL  Glucose, capillary     Status: Abnormal   Collection Time: 10/22/20 11:18 AM  Result Value Ref Range   Glucose-Capillary 164 (H) 70 - 99 mg/dL  Glucose, capillary     Status: Abnormal   Collection Time: 10/22/20  4:29 PM  Result Value Ref Range   Glucose-Capillary 134 (H) 70 - 99 mg/dL  Glucose, capillary     Status: Abnormal   Collection Time: 10/22/20  8:50 PM  Result Value Ref Range   Glucose-Capillary 168 (H) 70 - 99 mg/dL  Glucose, capillary     Status: Abnormal    Collection Time: 10/22/20 11:56 PM  Result Value Ref Range   Glucose-Capillary 200 (H) 70 - 99 mg/dL  Glucose, capillary     Status: Abnormal   Collection Time: 10/23/20  4:39  AM  Result Value Ref Range   Glucose-Capillary 156 (H) 70 - 99 mg/dL   No results found for this or any previous visit (from the past 240 hour(s)). Creatinine: Recent Labs    10/17/20 0454 10/18/20 0412 10/19/20 0719 10/21/20 0958  CREATININE 1.08 1.03 1.10 1.03    Impression/Assessment/plan:  Urinary retention, difficult Foley-I would leave the Foley catheter for at least 5 days and then if CIC is needed and resumed use at least a 16 or 18 [place] coud catheter.  Something with enough curve and size that it would pass through the prostate more easily than the smaller stiffer catheter.  I will notify Dr. Alinda Money of the catheterization.  Festus Aloe 10/23/2020, 6:13 AM

## 2020-10-23 NOTE — Progress Notes (Signed)
PROGRESS NOTE   Subjective/Complaints: Spoke with daughter who was present this morning about stool, potassium, pentasa, urine retention, swallowing/PEG, trach, etc.  Foley just placed by urology this morning d/t inability to pass catheter for I/O caths.   ROS: Patient denies fever, rash,   blurred vision, nausea, vomiting, diarrhea, cough, shortness of breath or chest pain, joint or back pain, headache, or mood change.   Objective:   DG Chest 2 View  Result Date: 10/23/2020 CLINICAL DATA:  Hypoxia.  History of hypertension and diabetes. EXAM: CHEST - 2 VIEW COMPARISON:  Radiographs 10/10/2020 and 10/07/2020.  CT 09/10/2020. FINDINGS: Tracheostomy and feeding tube are in place. There are persistent low lung volumes with moderate bilateral pleural effusions and associated bibasilar atelectasis. There may be mild edema. No pneumothorax. The heart size and mediastinal contours are stable. Patient is status post thoracolumbar fusion with chronic fracture of the interconnecting rods bilaterally. IMPRESSION: Stable chest with persistent bilateral pleural effusions, bibasilar atelectasis and possible mild edema. Electronically Signed   By: Richardean Sale M.D.   On: 10/23/2020 08:13   No results for input(s): WBC, HGB, HCT, PLT in the last 72 hours. Recent Labs    10/21/20 0958  NA 127*  K 5.1  CL 89*  CO2 31  GLUCOSE 134*  BUN 45*  CREATININE 1.03  CALCIUM 8.5*    Intake/Output Summary (Last 24 hours) at 10/23/2020 8101 Last data filed at 10/23/2020 0900 Gross per 24 hour  Intake 675 ml  Output 2850 ml  Net -2175 ml        Physical Exam: Vital Signs Blood pressure (!) 120/56, pulse 74, temperature 98.5 F (36.9 C), resp. rate 18, weight 85 kg, SpO2 96 %.     Constitutional: No distress . Vital signs reviewed. HEENT: EOMI, oral membranes moist, NGT Neck: supple, dysarthric speech. Cardiovascular: RRR without murmur. No  JVD    Respiratory/Chest: CTA Bilaterally without wheezes or rales. Normal effort    GI/Abdomen: BS +, non-tender, non-distended Ext: no clubbing, cyanosis, or edema Psych: pleasant and cooperative Uro: foley in place, with clear,yellow urine Skin:  Scattered bruises on UE's. Back incision CDI, abrasion/redness alongside of incision Neuro: awake, alert and appropriate.  No focal CN abnl except decreased cough. Reflexes are 1+ in all 4's.. UE grossly 3/5 prox to distal. LE: 2/5 HF, KE and 3/5 ADF/APF no motor changes today. Senses LT/P in both LE's Musculoskeletal: LB TTP   Assessment/Plan: 1. Functional deficits which require 3+ hours per day of interdisciplinary therapy in a comprehensive inpatient rehab setting.  Physiatrist is providing close team supervision and 24 hour management of active medical problems listed below.  Physiatrist and rehab team continue to assess barriers to discharge/monitor patient progress toward functional and medical goals  Care Tool:  Bathing    Body parts bathed by patient: Right arm,Left arm,Chest,Abdomen,Face,Right upper leg,Left upper leg   Body parts bathed by helper: Front perineal area,Buttocks,Right lower leg,Left lower leg     Bathing assist Assist Level: Moderate Assistance - Patient 50 - 74%     Upper Body Dressing/Undressing Upper body dressing   What is the patient wearing?: Hospital gown only    Upper  body assist Assist Level: Moderate Assistance - Patient 50 - 74%    Lower Body Dressing/Undressing Lower body dressing      What is the patient wearing?: Incontinence brief     Lower body assist Assist for lower body dressing: Total Assistance - Patient < 25%     Toileting Toileting    Toileting assist Assist for toileting: 2 Helpers     Transfers Chair/bed transfer  Transfers assist  Chair/bed transfer activity did not occur: Safety/medical concerns  Chair/bed transfer assist level: Minimal Assistance - Patient >  75%     Locomotion Ambulation   Ambulation assist      Assist level: Contact Guard/Touching assist Assistive device: Parallel bars Max distance: 20'   Walk 10 feet activity   Assist  Walk 10 feet activity did not occur: Safety/medical concerns  Assist level: Contact Guard/Touching assist Assistive device: Parallel bars   Walk 50 feet activity   Assist Walk 50 feet with 2 turns activity did not occur: Safety/medical concerns         Walk 150 feet activity   Assist Walk 150 feet activity did not occur: Safety/medical concerns         Walk 10 feet on uneven surface  activity   Assist Walk 10 feet on uneven surfaces activity did not occur: Safety/medical concerns         Wheelchair     Assist Will patient use wheelchair at discharge?: No             Wheelchair 50 feet with 2 turns activity    Assist            Wheelchair 150 feet activity     Assist          Blood pressure (!) 120/56, pulse 74, temperature 98.5 F (36.9 C), resp. rate 18, weight 85 kg, SpO2 96 %.  Medical Problem List and Plan: 1.  T11/12 compression fx s/p posterior fusion secondary to fall with debility due to asp PNA/trach/generalzied weakness             -patient may not shower             -ELOS/Goals: 2-3 weeks- min A  -Continue CIR therapies including PT, OT, and SLP   2.  Antithrombotics: -DVT/anticoagulation:  Pharmaceutical: Other (comment)--Apixaban bid.              -antiplatelet therapy: N/A 3. Pain Management:  hydrocodone prn for severe pain  - Tramadol prn for moderate pain.  5/10 - pain controlled- con't regimen   4. Mood/anxiety/sleep: LCSW to follow for evaluation and support.              -antipsychotic agents: N/A  -xanax bid prn anxiety  -continue trazodone for sleep 5. Neuropsych: This patient is not fully capable of making decisions on his own behalf. 6. Skin/Wound Care: Pressure relief measures.  7.  Fluids/Electrolytes/Nutrition: NPO with tube feeds for nutritional support.             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               --?inflammatory subglottic mucosal, negative for malignancy             --chest PT, sx, OOB  -vest to help with secretions  5/16 secretions have been better.  PCCM following   -downsize week of 5/23 to coincide with f/u bronch 9. Acute on chronic respiratory failure: Encourage pulmonary hygiene              --continue Pulmicort nebs bid.  10.  PAF:  Monitor HR tid--continue Atenolol bid with Cardizem tid.   -HR controlled 5/9 10. Hyponatremia: Has resolved  11. Acute blood loss anemia, anemia chronic disease: Monitor with serial checks.   -hgb 8.4 5/6 12. Leukocytosis: WBC continues to fluctuate.   -no new signs of infection  -5/6 wbc 13.5.  CBC pending 5/16 13. Crohn's disease:    - once able to swallow pills, can try adding home Crohn's meds to help.    -continue florastor and questran  5/16 stools type 5 yesterday   -imodium decreased. Continue questran   -rectal tube out!   -dc pentasa as nursing can't get it down cortrak tube 14.  Bilateral pleural effusion: Pulmonary hygiene w/ flutter valve.              --continue to monitor for symptoms with increase in activity.   15. Aspiration PNA: zosyn completed 5/5 16. Dysphagia: Continue NPO with Glucerna               -MBS 5/12 per SLP demonstrated no improvement in peri-glottic mass/area nor improvement in swallow  -discussed PEG with daughter who was present today. Answered questions. She would like to speak with his GI specialist as well. I await their final decision  -continue NGT 17. T2DM: Was on Humalog tid prior to admission.             --will monitor BS every 4 hours as on tube feeds and use SSI prn    CBG (last 3)   Recent Labs    10/22/20 2356 10/23/20 0439 10/23/20 0807  GLUCAP 200* 156* 152*  5/16, needs better control. Consider adding low dose lantus  18. Urine retention: appreciate urology placement of foley catheter  -continue for this week at least.   Greater than 35 total minutes was spent in examination of patient, assessment of pertinent data,  formulation of a treatment plan, and in discussion with patient and/or family.     LOS: 11 days A FACE TO [place] 10/23/2020, 9:27 AM

## 2020-10-23 NOTE — Progress Notes (Signed)
ABG    Component Value Date/Time   PHART 7.357 10/23/2020 1650   PCO2ART 55.6 (H) 10/23/2020 1650   PO2ART 59.4 (L) 10/23/2020 1650   HCO3 30.4 (H) 10/23/2020 1650   TCO2 23.0 03/17/2015 0540   ACIDBASEDEF 3.6 (H) 03/17/2015 0540   O2SAT 90.3 10/23/2020 1650    Updated daughter [name] - ABG with hypercapnia looks chronic and his bicarb on BMPs has been stable for the past month. Didn't sleep all night per daughter, had done all PT, OT, SLP this morning. She is not surprised that he was sleepy today and says that later this afternoon he called her and seemed pretty awake.  Julian Hy, DO 10/23/20 5:19 PM [facility]

## 2020-10-23 NOTE — Progress Notes (Signed)
NAME:  Devin [name], MRN:  [id number], DOB:  [birth year], LOS: 60 ADMISSION DATE:  10/12/2020, CONSULTATION DATE:  5/3 REFERRING MD:  Verlon Au, CHIEF COMPLAINT:  Dyspnea   History of Present Illness:  This is a 80 year old male who we had been following s/p fall resulting in T11/12 fracture requiring requiring fusion and complicated by prolonged mechanical ventilation and failed extubation attempt x 2 requiring trach. He had been dc'd to rehab. Re-admitted to hospital  after cor trak dislodged and had aspiration PNA c/b hypoglycemia and delirium.  Critical care emergently consulted 2/2 trach obstruction on 5/3  Pertinent  Medical History  afib on chronic ac, DM type II, prior lobectomy (LUL), chron's disease, thyroid cancer, prostate cancer, T11-T12 fracture, required posterior fusion, failed extubation 2/2 upper-airway obstruction. Required trach. Went to rehab.   Recent soft tissue mass at level of supraglottic larynx->neg for malignancy (this was incidentally identified initially on MBS showing thickening prevertebral tissue w/ CT head/neck showing prevertebral soft tissue mass concerning for malignancy   Significant Hospital Events: Including procedures, antibiotic start and stop dates in addition to other pertinent events    4/29 admitted for cortrak malposition, aspiration and encephalopathy  5/3 PCCM emergently consulted for inability to suction and upper airway obstruction Had sig increase of inflammatory tissue on posterior wall of trachea. Was able to bypass with distal XLT. Still has some intermittent tracheomalacia w/ dynamic collapse of airway noted w/ cough   . 5/9 in rehab. Voice quality better. Feeling stronger  . 5/16 foley placed by urology. working with OT, talking   Interim History / Subjective:  Urology placed foley 5/16 No other acute events   FiO2 decr from 35 to 28  Objective   Blood pressure (!) 120/56, pulse 64, temperature 98.5 F (36.9 C), resp. rate (!)  21, weight 85 kg, SpO2 100 %.    FiO2 (%):  [28 %-35 %] 28 %   Intake/Output Summary (Last 24 hours) at 10/23/2020 0748 Last data filed at 10/23/2020 5188 Gross per 24 hour  Intake 675 ml  Output 2850 ml  Net -2175 ml   Filed Weights   10/20/20 0500 10/21/20 0300 10/22/20 0326  Weight: 99 kg 86 kg 85 kg    Examination:  General: debilitated appearing older adult M reclined in bed NAD  HENT NCAT pink mm, tacky. 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 Often times this can resolve with repositioning of trach which we did by placing XLT distal) -improving phonation quality with distal xlt   Plan: -Following 1-2x/wk  -Routine trach care, cont PMV efforts -Trach change week of 5/23 with bronch (Likely in endo, [employer] pccm)   -Not aggressively working toward decannulation right now  -Might need DL to evaluate supraglottal and further eval of subglottal area by ENT to ensure he doesn't need dilation or laser -Cont current rehab focus  Best practice (right click and "Reselect all SmartList Selections" daily)   Per primary    Eliseo Gum MSN, AGACNP-BC [place] for pager details  10/23/2020, 7:48  AM

## 2020-10-23 NOTE — Progress Notes (Signed)
Speech Language Pathology Daily Session Note  Patient Details  Name: Devin [name] MRN: [id number] Date of Birth: 1941/03/29  Today's Date: 10/23/2020 SLP Individual Time: 1030-1100 SLP Individual Time Calculation (min): 30 min  Short Term Goals: Week 2: SLP Short Term Goal 1 (Week 2): Patient will initiate verbal responses vs use of gestures for communication with PMSV in place in 75% of opportunities with Mod verbal cues. SLP Short Term Goal 2 (Week 2): Patient will utilize an increased vocal intensity at the phrase level to achieve ~90% intelligibility with Mod A verbal cues. SLP Short Term Goal 3 (Week 2): Patient will recall swallowing and safety precautions for trials of ice chips wth Min A multimodal cues. SLP Short Term Goal 4 (Week 2): Patient will utilize external memory aids to recall new, daily information with supervision level verbal cues.  Skilled Therapeutic Interventions: Skilled treatment session focused on speech and dysphagia goals. Upon arrival, patient was lethargic with his daughter present. SLP facilitated session by providing education to the daughter (Debi) regarding patient's current swallowing function and plan of care. She verbalized understanding. With set-up assist, patient performed oral care via the suction toothbrush. Patient consumed minimal trials of ice chips with multiple swallows and oral expectoration of suspected pharyngeal residuals. PMSV was in place throughout entire session and Mod verbal cues were needed for use of verbal expression and an increased vocal intensity to maximize intelligibility. Patient left upright in bed with daughter and PA present. Continue with current plan of care.      Pain Pain Assessment Pain Scale: 0-10 Pain Score: 0-No pain  Therapy/Group: Individual Therapy  Brandon Wiechman 10/23/2020, 12:10 PM

## 2020-10-23 NOTE — Progress Notes (Signed)
Physical Therapy Session Note  Patient Details  Name: Devin [name] MRN: [id number] Date of Birth: 14-Jul-1940  Today's Date: 10/23/2020 PT Individual Time: 1501-1527 PT Individual Time Calculation (min): 26 min   Short Term Goals: Week 2:  PT Short Term Goal 1 (Week 2): Pt will perform bed mobility consistently with CGA. PT Short Term Goal 2 (Week 2): Pt will perform bed to chair consistently with CGA. PT Short Term Goal 3 (Week 2): Pt will ambulate 84' with minA and LRAD.  Skilled Therapeutic Interventions/Progress Updates:    Pt greeted supine in bed at start of session. Wife at bedside. Pt on 28% FiO2 trach collar, coretrack, telemetry, catheter, and PMV. Pt with soft spoken voice and difficult to understand at times. Pt reports no pain but requires convincing to participate in therapy. He reports he already completed PT and is too tired to do anything more. Wife providing encouragement and pt ultimately agreeable to OOB to chair only. Rolled in bed with minA and bed rails to pull pants over hips. Supine<>sit via log rolling with minA for trunk elevation. Donned TLSO with totalA for time management while he sat EOB. Stand<>pivot with a heavy minA from EOB to TIS w/c. Pt made comfortable in TIS w/c. Extra time needed due to lines/leads. Pt remained reclined in TIS w/c in mostly upright position at end of session. RN made aware of pt position and RN also reporting he will apply safety belt alarm. Needs in reach and wife at bedside at end of session.  Therapy Documentation Precautions:  Precautions Precautions: Fall,Back Precaution Booklet Issued: No Precaution Comments: rectal tube, trach collar, cortrak, TLSO Required Braces or Orthoses: Spinal Brace Spinal Brace: Thoracolumbosacral orthotic,Applied in sitting position Restrictions Weight Bearing Restrictions: No General:    Therapy/Group: Individual Therapy  Zenda Herskowitz P Herby Amick PT 10/23/2020, 7:49 AM

## 2020-10-23 NOTE — Progress Notes (Signed)
Patient ID: Devin [name], male   DOB: [birth year], 80 y.o.   MRN: [id number]  SW waiting on updates from [facility] about HHPT/OT/SLP/aide/SN referral.    Loralee Pacas, MSW, [place] Office: [phone] Cell: [phone] Fax: [phone]

## 2020-10-23 NOTE — Progress Notes (Signed)
In and out cath attempted x4, by two nurses, including myself and charge nurse without success. Resistance noted with advance of catheter, as well small amount of blood noted. Dr. Letta Pate notified and was informed that urology would have to place indwelling foley catheter. Bladder scan scan=938. Indwelling foley #20 coude inserted by urologist with suggestion to not try to remove for at least 6 days. Immediate return of yellow urine. No distress noted.

## 2020-10-24 ENCOUNTER — Inpatient Hospital Stay (HOSPITAL_COMMUNITY): Payer: Medicare Other

## 2020-10-24 DIAGNOSIS — R1310 Dysphagia, unspecified: Secondary | ICD-10-CM

## 2020-10-24 DIAGNOSIS — G9340 Encephalopathy, unspecified: Secondary | ICD-10-CM

## 2020-10-24 DIAGNOSIS — J9601 Acute respiratory failure with hypoxia: Secondary | ICD-10-CM

## 2020-10-24 LAB — GLUCOSE, CAPILLARY
Glucose-Capillary: 153 mg/dL — ABNORMAL HIGH (ref 70–99)
Glucose-Capillary: 153 mg/dL — ABNORMAL HIGH (ref 70–99)
Glucose-Capillary: 166 mg/dL — ABNORMAL HIGH (ref 70–99)
Glucose-Capillary: 168 mg/dL — ABNORMAL HIGH (ref 70–99)
Glucose-Capillary: 170 mg/dL — ABNORMAL HIGH (ref 70–99)
Glucose-Capillary: 176 mg/dL — ABNORMAL HIGH (ref 70–99)

## 2020-10-24 LAB — URINE CULTURE: Culture: NO GROWTH

## 2020-10-24 MED ORDER — TRAZODONE HCL 50 MG PO TABS
75.0000 mg | ORAL_TABLET | Freq: Every day | ORAL | Status: DC
  Administered 2020-10-24 – 2020-10-30 (×7): 75 mg
  Filled 2020-10-24 (×7): qty 2

## 2020-10-24 NOTE — Progress Notes (Signed)
Speech Language Pathology Daily Session Note  Patient Details  Name: Devin [name] MRN: [id number] Date of Birth: 03-11-1941  Today's Date: 10/24/2020 SLP Individual Time: 1430-1515 SLP Individual Time Calculation (min): 45 min  Short Term Goals: Week 2: SLP Short Term Goal 1 (Week 2): Patient will initiate verbal responses vs use of gestures for communication with PMSV in place in 75% of opportunities with Mod verbal cues. SLP Short Term Goal 2 (Week 2): Patient will utilize an increased vocal intensity at the phrase level to achieve ~90% intelligibility with Mod A verbal cues. SLP Short Term Goal 3 (Week 2): Patient will recall swallowing and safety precautions for trials of ice chips wth Min A multimodal cues. SLP Short Term Goal 4 (Week 2): Patient will utilize external memory aids to recall new, daily information with supervision level verbal cues.  Skilled Therapeutic Interventions: Pt seen for skilled ST with focus on swallowing and speech goals. Upon arrival, pt finishing PT session up in wheelchair with wife present. Pt very lethargic throughout treatment, wife questions medication impact. Pt responds to verbal and tactile cues to maintain alertness, attention to task average ~15 seconds. Pt requiring mod A cues for completion of oral care with suction, pt noted with much less secretions and need to expectorate. Pt consuming ice chips with overt s/s aspiration (weak throat clear, occ weak cough) on 90% of trials. PMSV was in place throughout entire session with O2 levels maintained at 94% and higher. Pt benefits from mod A verbal cues to utilize verbalizations and to increase vocal intensity to maximize intelligibility. Pt utilizing gestures 25% of time for speech, relying more on 1-2 word utterances to communicate this date. Pt and wife educated on goals of care including trying to have pt increase independence with functional tasks as able. Training and education is ongoing. Pt left in  wheelchair with alarm set with wife present and all needs met. Cont ST POC.   Pain Pain Assessment Pain Scale: 0-10 Pain Score: 5  Pain Location: Buttocks  Therapy/Group: Individual Therapy  [name] 10/24/2020, 3:18 PM

## 2020-10-24 NOTE — Progress Notes (Signed)
Physical Therapy Session Note  Patient Details  Name: Devin [name] MRN: [id number] Date of Birth: 01-15-1941  Today's Date: 10/24/2020 PT Individual Time: 0930-1028 PT Individual Time Calculation (min): 58 min   Short Term Goals: Week 1:  PT Short Term Goal 1 (Week 1): Pt will complete bed mobility consistently with minA. PT Short Term Goal 1 - Progress (Week 1): Met PT Short Term Goal 2 (Week 1): Pt will perform bed to chair transfer consistently with minA. PT Short Term Goal 2 - Progress (Week 1): Met PT Short Term Goal 3 (Week 1): Pt will ambulate x25' with minA and LRAD. PT Short Term Goal 3 - Progress (Week 1): Progressing toward goal Week 2:  PT Short Term Goal 1 (Week 2): Pt will perform bed mobility consistently with CGA. PT Short Term Goal 2 (Week 2): Pt will perform bed to chair consistently with CGA. PT Short Term Goal 3 (Week 2): Pt will ambulate 52' with minA and LRAD. Week 3:     Skilled Therapeutic Interventions/Progress Updates:    Pain:  Pt reports back pain.  Treatment to tolerance.  Rest breaks and repositioning as needed.  Pt initially oob in wc and once daughter provided much encouragment, pt was agreeable to treatment session w/focus on functional mobility, general conditioning.  Pt transported to gym, on 28% Fio2 via trach collar.  Pt Sit to stand from wc w/min assist.  Gait 47f x 2 w/RW, min assist, overall flexed posture.  Rested in sitting 6-7 min between efforts. Standing activity: Pt initially states "You are out of your mind" but then w/encouragment agrees to attempt.  Sit to stand at hi/lo table w/min to mod assist.  Standing pt used R hand to reach for then clip 9 clothespins to 3levels of box w/cga.  Stood approx 2-3 min w/cga.  At end of session, pt transported back to room.  Pt left oob in wc w/alarm belt set and needs in reach   Therapy Documentation Precautions:  Precautions Precautions: Fall,Back Precaution Booklet Issued:  No Precaution Comments: rectal tube, trach collar, cortrak, TLSO Required Braces or Orthoses: Spinal Brace Spinal Brace: Thoracolumbosacral orthotic,Applied in sitting position Restrictions Weight Bearing Restrictions: No   Therapy/Group: Individual Therapy  BCallie Fielding PAshton5/17/2022, 1:21 PM

## 2020-10-24 NOTE — Progress Notes (Signed)
Difficulty administering Pentasa via NG tube. Granules do not dissolve and clog up tube. Changed out lopez valve and flushed to unclog. About 10% of medication was administered.

## 2020-10-24 NOTE — Progress Notes (Signed)
Occupational Therapy Session Note  Patient Details  Name: Devin [name] MRN: [id number] Date of Birth: [birth year]  Today's Date: 10/24/2020 OT Individual Time: 0802-0900 OT Individual Time Calculation (min): 58 min    Short Term Goals: Week 2:  OT Short Term Goal 1 (Week 2): Pt will complete bathing with mod assist at sit > stand level OT Short Term Goal 2 (Week 2): Pt will don shirt with min assist OT Short Term Goal 3 (Week 2): Pt will complete toilet transfer with min A and LRAD  Skilled Therapeutic Interventions/Progress Updates:    Pt greeted semi-reclined in bed with nurse techs doing finger prick. Pt agreeable to OT treatment session with encouragement. Pt completed bed mobility with min A. OT assist to don TED hose and socks while seated EOB. TLSO donned in sitting, then pt completed stand-pivot w/ RW. Mod A to get to standing from lower bed, then min A to pivot over to TIS wc. Nursing came in and unhooked pt from feeding. Pt brought to the sink for UB dressing task. Pt needed encouragement to try to doff his own shirt and needed OT assist 2/2 trach, and NG tube. Pt then donned deodorant with set-up and needed mod A to get shirt back on overhead. Worked on standing balance/endurance at the sink. Pt completed 2 sit<>stands with min A. First stand pt tolerated staying up for 1 minute. Then second stand, pt tolerated standing for 1.5 minutes with max encouragement. Pt;s goal was to stand for 2 minutes, but he could not tolerate it. Pt stated his legs felt like they were going to give out, but no buckling or shaking noted. Pt refused any further activity, but agreable to stay up in wc in between therapies. Pt left with alarm belt on, suction present, call bell in reach, and needs met.   Therapy Documentation Precautions:  Precautions Precautions: Fall,Back Precaution Booklet Issued: No Precaution Comments: rectal tube, trach collar, cortrak, TLSO Required Braces or Orthoses: Spinal  Brace Spinal Brace: Thoracolumbosacral orthotic,Applied in sitting position Restrictions Weight Bearing Restrictions: No Pain:  Pain 6/10 in back. Rest and repositioned for comfort and nursing to administer pain meds  Therapy/Group: Individual Therapy  [name] 10/24/2020, 9:10 AM

## 2020-10-24 NOTE — Progress Notes (Addendum)
PROGRESS NOTE   Subjective/Complaints: Had some issues with suction last night? Tubing was disconnected when I looked for it in bed.  No new sob this morning. Still with cough, upper airway issues  ROS: Patient denies fever, rash, sore throat, blurred vision, nausea, vomiting, diarrhea    Objective:   DG Chest 2 View  Result Date: 10/23/2020 CLINICAL DATA:  Hypoxia.  History of hypertension and diabetes. EXAM: CHEST - 2 VIEW COMPARISON:  Radiographs 10/10/2020 and 10/07/2020.  CT 09/10/2020. FINDINGS: Tracheostomy and feeding tube are in place. There are persistent low lung volumes with moderate bilateral pleural effusions and associated bibasilar atelectasis. There may be mild edema. No pneumothorax. The heart size and mediastinal contours are stable. Patient is status post thoracolumbar fusion with chronic fracture of the interconnecting rods bilaterally. IMPRESSION: Stable chest with persistent bilateral pleural effusions, bibasilar atelectasis and possible mild edema. Electronically Signed   By: Richardean Sale M.D.   On: 10/23/2020 08:13   Recent Labs    10/23/20 1033  WBC 14.7*  HGB 9.4*  HCT 30.9*  PLT 263   Recent Labs    10/23/20 1033  NA 129*  K 5.1  CL 90*  CO2 32  GLUCOSE 195*  BUN 42*  CREATININE 0.99  CALCIUM 9.1    Intake/Output Summary (Last 24 hours) at 10/24/2020 1023 Last data filed at 10/24/2020 0500 Gross per 24 hour  Intake 1051.25 ml  Output 3925 ml  Net -2873.75 ml        Physical Exam: Vital Signs Blood pressure 135/62, pulse 67, temperature 97.6 F (36.4 C), temperature source Oral, resp. rate 16, weight 85 kg, SpO2 99 %.     Constitutional: No distress . Vital signs reviewed. HEENT: EOMI, oral membranes moist Neck: supple Cardiovascular: RRR without murmur. No JVD    Respiratory/Chest: CTA Bilaterally without wheezes or rales. Normal effort    GI/Abdomen: BS +, non-tender,  non-distended Ext: no clubbing, cyanosis, or edema Psych: pleasant and cooperative Uro: foley in place, with clear,yellow urine Skin:  Scattered bruises on UE's. Back incision CDI, abrasion along old incision, foam  Neuro: awake, alert and appropriate.  No focal CN abnl except decreased cough. Reflexes are 1+ in all 4's.. UE grossly 3/5 prox to distal. LE: 2/5 HF, KE and 3/5 ADF/APF no motor changes today. Senses LT/P in both LE's Musculoskeletal: LB TTP   Assessment/Plan: 1. Functional deficits which require 3+ hours per day of interdisciplinary therapy in a comprehensive inpatient rehab setting.  Physiatrist is providing close team supervision and 24 hour management of active medical problems listed below.  Physiatrist and rehab team continue to assess barriers to discharge/monitor patient progress toward functional and medical goals  Care Tool:  Bathing    Body parts bathed by patient: Right arm,Left arm,Chest,Abdomen,Face,Right upper leg,Left upper leg   Body parts bathed by helper: Front perineal area,Buttocks,Right lower leg,Left lower leg     Bathing assist Assist Level: Moderate Assistance - Patient 50 - 74%     Upper Body Dressing/Undressing Upper body dressing   What is the patient wearing?: Hospital gown only    Upper body assist Assist Level: Moderate Assistance - Patient 50 -  74%    Lower Body Dressing/Undressing Lower body dressing      What is the patient wearing?: Incontinence brief     Lower body assist Assist for lower body dressing: Total Assistance - Patient < 25%     Toileting Toileting    Toileting assist Assist for toileting: 2 Helpers     Transfers Chair/bed transfer  Transfers assist  Chair/bed transfer activity did not occur: Safety/medical concerns  Chair/bed transfer assist level: Minimal Assistance - Patient > 75%     Locomotion Ambulation   Ambulation assist      Assist level: Contact Guard/Touching assist Assistive  device: Walker-rolling Max distance: 10   Walk 10 feet activity   Assist  Walk 10 feet activity did not occur: Safety/medical concerns  Assist level: Contact Guard/Touching assist Assistive device: Parallel bars   Walk 50 feet activity   Assist Walk 50 feet with 2 turns activity did not occur: Safety/medical concerns         Walk 150 feet activity   Assist Walk 150 feet activity did not occur: Safety/medical concerns         Walk 10 feet on uneven surface  activity   Assist Walk 10 feet on uneven surfaces activity did not occur: Safety/medical concerns         Wheelchair     Assist Will patient use wheelchair at discharge?: No             Wheelchair 50 feet with 2 turns activity    Assist            Wheelchair 150 feet activity     Assist          Blood pressure 135/62, pulse 67, temperature 97.6 F (36.4 C), temperature source Oral, resp. rate 16, weight 85 kg, SpO2 99 %.  Medical Problem List and Plan: 1.  T11/12 compression fx s/p posterior fusion secondary to fall with debility due to asp PNA/trach/generalzied weakness             -patient may not shower             -ELOS/Goals: 2-3 weeks- min A  -Continue CIR therapies including PT, OT, and SLP   2.  Antithrombotics: -DVT/anticoagulation:  Pharmaceutical: Other (comment)--Apixaban bid.              -antiplatelet therapy: N/A 3. Pain Management:  hydrocodone prn for severe pain  - Tramadol prn for moderate pain.  5/17 - fair to reasonable pain control   4. Mood/anxiety/sleep: LCSW to follow for evaluation and support.              -antipsychotic agents: N/A  -xanax bid prn anxiety  -continue trazodone for sleep 5. Neuropsych: This patient is not fully capable of making decisions on his own behalf. 6. Skin/Wound Care: Pressure relief measures.  7. Fluids/Electrolytes/Nutrition: NPO with tube feeds for nutritional support.             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               --?inflammatory subglottic mucosal, negative for malignancy             --chest PT, sx, OOB  -vest to help with secretions  5/17 secretions have been better. PCCM following   -downsize week of 5/23 to coincide with f/u bronch   -  ABG yesterday 9. Acute on chronic respiratory failure: Encourage pulmonary hygiene              --continue Pulmicort nebs bid.  10.  PAF:  Monitor HR tid--continue Atenolol bid with Cardizem tid.   -HR controlled 5/9 10. Hyponatremia: Has resolved  11. Acute blood loss anemia, anemia chronic disease: Monitor with serial checks.   -hgb 8.4 5/6 12. Leukocytosis: WBC continues to fluctuate.   -no new signs of infection  -5/16 14.7  Repeat CBC tomorrow 13. Crohn's disease:    - once able to swallow pills, can try adding home Crohn's meds to help.    -continue florastor and questran  5/16 stools type 5 yesterday   -imodium decreased. Continue questran   -rectal tube out!   -dc pentasa as nursing can't get it down cortrak tube 14.  Bilateral pleural effusion: Pulmonary hygiene w/ flutter valve.              --continue to monitor for symptoms with increase in activity.   15. Aspiration PNA: zosyn completed 5/5 16. Dysphagia: Continue NPO with Glucerna               -MBS 5/12 per SLP demonstrated no improvement in peri-glottic mass/area nor improvement in swallow  -5/17 spoke with daughter today. Family/pt would like to proceed with PEG. Will consult IR. 17. T2DM: Was on Humalog tid prior to admission.             --will monitor BS every 4 hours as on tube feeds and use SSI prn    CBG (last 3)  Recent Labs    10/24/20 0004 10/24/20 0355 10/24/20 0813  GLUCAP 170* 176* 168*  5/17, fair control. Consider adding low dose lantus  18. Urine retention: appreciate urology placement of foley  catheter  -continue for this week at least.       LOS: 12 days A FACE TO [place] 10/24/2020, 10:23 AM

## 2020-10-24 NOTE — Progress Notes (Signed)
Patient ID: Devin [name], male   DOB: 08-May-1941, 80 y.o.   MRN: [id number]  SW met with pt, pt wife, and pt dtr [name] to provide updates from team conference, d/c date remains 5/28, pt will d/c home with trach and plans to get PEG. SW discussed discharge with home health for HHPT/OT/SLP/aide/SN (trach and tube feed education). SW informed will continue to explore H[facility] that may be able to accept for care needs, and a company that support enteral feeds. Wife had a lot of questions about DME, and services offered by HHA. SW explained once final recommendations have been entered, SW will inform. Requests that he have a fully electric hospital bed due to lifting. SW explained services offered by HHAs. SW will continue to follow.  Loralee Pacas, MSW, [place] Office: [phone] Cell: [phone] Fax: [phone]

## 2020-10-24 NOTE — Progress Notes (Signed)
Physical Therapy Session Note  Patient Details  Name: Devin [name] MRN: [id number] Date of Birth: 1940/08/05  Today's Date: 10/24/2020 PT Individual Time: 2919-1660 PT Individual Time Calculation (min): 25 min   Short Term Goals: Week 2:  PT Short Term Goal 1 (Week 2): Pt will perform bed mobility consistently with CGA. PT Short Term Goal 2 (Week 2): Pt will perform bed to chair consistently with CGA. PT Short Term Goal 3 (Week 2): Pt will ambulate 60' with minA and LRAD.  Skilled Therapeutic Interventions/Progress Updates:    Pt received supine in bed, asleep with his wife present. Pt wearing PMV upon therapist arrival and remained donned throughout session - wearing trach collar with 5L O2 on 28% FiO2 - SpO2 92% upon arrival (pt on continuous HR and SpO2 monitoring). Upon awakening, pt agreeable to therapy session with min encouragement from therapist and wife. Pt remained lethargic throughout session with eyes closed >90% of the time. Supine>sitting L EOB, HOB slightly elevated and using bedrail, via logroll technique with min assist for trunk upright. Sitting EOB, donned TLSO max assist - pt using B UE support on bed to maintain trunk control. Sit>stand EOB>RW with mod assist for lifting to stand and pt requiring 2 attempts in order to achieve upright. R stand pivot EOB> TIS w/c using RW with light min assist for balance - cuing for sequencing of AD and LE stepping. During transfer SpO2 decreased to 85% but quickly recovered to 90% in <10seconds upon sitting in w/c therefore question if reading was accurate. Sit>stand from TIS w/c to RW with min assist for lifting to stand and then CGA for steadying while standing with B UE support on RW performing marching in place (no knee instability noted). At end of session pt left seated in TIS w/c with lines intact, wife present, and SLP present to assume care of patient.  Therapy Documentation Precautions:  Precautions Precautions:  Fall,Back Precaution Booklet Issued: No Precaution Comments: rectal tube, trach collar, cortrak, TLSO Required Braces or Orthoses: Spinal Brace Spinal Brace: Thoracolumbosacral orthotic,Applied in sitting position Restrictions Weight Bearing Restrictions: No  Pain: Upon coming to stand from EOB pt states he is doing "terrible" but he does not elaborate further. Pt does not demonstrate grimacing or other signs of increased pain.   Therapy/Group: Individual Therapy  Tawana Scale , PT, DPT, CSRS  10/24/2020, 1:00 PM

## 2020-10-25 ENCOUNTER — Inpatient Hospital Stay (HOSPITAL_COMMUNITY): Payer: Medicare Other

## 2020-10-25 LAB — CBC
HCT: 29.4 % — ABNORMAL LOW (ref 39.0–52.0)
Hemoglobin: 8.8 g/dL — ABNORMAL LOW (ref 13.0–17.0)
MCH: 30.1 pg (ref 26.0–34.0)
MCHC: 29.9 g/dL — ABNORMAL LOW (ref 30.0–36.0)
MCV: 100.7 fL — ABNORMAL HIGH (ref 80.0–100.0)
Platelets: 227 10*3/uL (ref 150–400)
RBC: 2.92 MIL/uL — ABNORMAL LOW (ref 4.22–5.81)
RDW: 16.1 % — ABNORMAL HIGH (ref 11.5–15.5)
WBC: 9.4 10*3/uL (ref 4.0–10.5)
nRBC: 0 % (ref 0.0–0.2)

## 2020-10-25 LAB — GLUCOSE, CAPILLARY
Glucose-Capillary: 123 mg/dL — ABNORMAL HIGH (ref 70–99)
Glucose-Capillary: 148 mg/dL — ABNORMAL HIGH (ref 70–99)
Glucose-Capillary: 155 mg/dL — ABNORMAL HIGH (ref 70–99)
Glucose-Capillary: 163 mg/dL — ABNORMAL HIGH (ref 70–99)
Glucose-Capillary: 166 mg/dL — ABNORMAL HIGH (ref 70–99)
Glucose-Capillary: 168 mg/dL — ABNORMAL HIGH (ref 70–99)
Glucose-Capillary: 178 mg/dL — ABNORMAL HIGH (ref 70–99)

## 2020-10-25 LAB — BASIC METABOLIC PANEL
Anion gap: 4 — ABNORMAL LOW (ref 5–15)
BUN: 41 mg/dL — ABNORMAL HIGH (ref 8–23)
CO2: 34 mmol/L — ABNORMAL HIGH (ref 22–32)
Calcium: 8.9 mg/dL (ref 8.9–10.3)
Chloride: 92 mmol/L — ABNORMAL LOW (ref 98–111)
Creatinine, Ser: 1 mg/dL (ref 0.61–1.24)
GFR, Estimated: >60 mL/min (ref >60)
Glucose, Bld: 169 mg/dL — ABNORMAL HIGH (ref 70–99)
Potassium: 5.1 mmol/L (ref 3.5–5.1)
Sodium: 130 mmol/L — ABNORMAL LOW (ref 135–145)

## 2020-10-25 NOTE — Progress Notes (Signed)
Medication Pentasa is very difficult to flush through Cortrak tube; causing clots in the line. Less than half medication able to be administered.

## 2020-10-25 NOTE — Progress Notes (Signed)
Patient ID: TREYSEN SUDBECK, male   DOB: [birth year], 80 y.o.   MRN: [id number]  Per EMR, attending waiting to hear from general surgery on placing PEG as IR cannot place PEG d/t mesh between stomach and anterior abdominal wall.  SW left message for [facility] [phone]) to inquire if they support enteral feeds/TF and waiting on follow-up. *SW spoke with Sharrie Rothman who will pull pt information from Epic and run benefits. SW to follow-up once there is more information concerning his care and if he gets PEG.   SW waiting on updates from [facility] if able to accept pt referral. *referral declined  SW sent referral to [facility] and waiting on follow-up.   Loralee Pacas, MSW, [place] Office: [phone] Cell: [phone] Fax: [phone]

## 2020-10-25 NOTE — Progress Notes (Signed)
Physical Therapy Session Note  Patient Details  Name: Devin [name] MRN: [id number] Date of Birth: 07/15/1940  Today's Date: 10/25/2020 PT Individual Time: 1131-1155 PT Individual Time Calculation (min): 24 min   Short Term Goals: Week 2:  PT Short Term Goal 1 (Week 2): Pt will perform bed mobility consistently with CGA. PT Short Term Goal 2 (Week 2): Pt will perform bed to chair consistently with CGA. PT Short Term Goal 3 (Week 2): Pt will ambulate 5' with minA and LRAD.  Skilled Therapeutic Interventions/Progress Updates:    Pt received supine in bed with daughter at bedside. Pt on 6L 28% Fio2 via trach collar saturating at 100% at start and end of session, wearing PMV throughout session although pt with hypophonic voice. Pt needing convincing to participate in PT. Daughter reports he just got back to bed from toileting on [facility]. Pt denies pain. Completed supine<>sit with light minA with use of hospital bed features. Able to sit unsupported at EOB with supervision. Donned TLSO brace while seated EOB with totalA. Sit<>stand to RW with a heavy minA with cues needed for correct hand placement. Able to statically stand with RW with CGA and requires minA for stand<>pivot transfer with RW to TIS w/c. Pt able to reposition in w/c with minA for reciprocal scooting. Pt remained reclined in TIS w/c with needs in reach at end of session. Extra time needed for functional mobility 2/2 line management.  Therapy Documentation Precautions:  Precautions Precautions: Fall,Back Precaution Booklet Issued: No Precaution Comments: rectal tube, trach collar, cortrak, TLSO Required Braces or Orthoses: Spinal Brace Spinal Brace: Thoracolumbosacral orthotic,Applied in sitting position Restrictions Weight Bearing Restrictions: No General:    Therapy/Group: Individual Therapy  Lydon Vansickle P Saahil Herbster PT 10/25/2020, 7:37 AM

## 2020-10-25 NOTE — Progress Notes (Signed)
Speech Language Pathology Daily Session Note  Patient Details  Name: Devin [name] MRN: [id number] Date of Birth: [birth year]  Today's Date: 10/25/2020 SLP Individual Time: 1300-1400 SLP Individual Time Calculation (min): 60 min  Short Term Goals: Week 2: SLP Short Term Goal 1 (Week 2): Patient will initiate verbal responses vs use of gestures for communication with PMSV in place in 75% of opportunities with Mod verbal cues. SLP Short Term Goal 2 (Week 2): Patient will utilize an increased vocal intensity at the phrase level to achieve ~90% intelligibility with Mod A verbal cues. SLP Short Term Goal 3 (Week 2): Patient will recall swallowing and safety precautions for trials of ice chips wth Min A multimodal cues. SLP Short Term Goal 4 (Week 2): Patient will utilize external memory aids to recall new, daily information with supervision level verbal cues.  Skilled Therapeutic Interventions: Pt seen for skilled ST with focus on swallowing and speech goals, pt much more awake this session than previous. Daughter and cousin present during tx session. Pt continues to independently increase frequency of verbal responses vs gestures, initiating spoken responses in ~85% of opportunities with min A cues. Intelligibility ~60% at sentence level, pt benefits from mod A verbal cues to increase breath support and vocal intensity. Pt utilizing suction to clear secretions throughout, secretions appear more clear and thin. SLP facilitating oral care by providing set up assistance, pt completing independently. Daughter educated on use of suction oral swabs and suction toothbrush. Pt consuming ice chips with immediate throat clear on 4/10 attempts, less frequent s/s aspiration than previous tx session. Daughter states pt is second guessing PEG placement d/t fear of another surgery, family will benefit from counseling.  Pt left in wheelchair with alarm set with cousin present. Cont ST POC.   Pain Pain  Assessment Pain Scale: 0-10 Pain Score: 5  Pain Location: Back  Therapy/Group: Individual Therapy  [name] 10/25/2020, 1:44 PM

## 2020-10-25 NOTE — Patient Care Conference (Signed)
Inpatient RehabilitationTeam Conference and Plan of Care Update Date: 10/24/2020   Time: 10:48 AM    Patient Name: Devin [name]      Medical Record Number: [id number]  Date of Birth: 1941/06/02 Sex: Male         Room/Bed: 4W15C/4W15C-01 Payor Info: Payor: Theme park manager MEDICARE / Plan: [facility] MEDICARE / Product Type: *No Product type* /    Admit Date/Time:  10/12/2020  7:03 PM  Primary Diagnosis:  Thoracic compression fracture [facility])  Hospital Problems: Principal Problem:   Thoracic compression fracture [facility]) Active Problems:   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 Alger Simons Care Coodinator Present: Loralee Pacas, LCSWA;Shawnee Gambone Creig Hines, RN, BSN, CRRN Nurse Present: Dorthula Nettles, RN PT Present: Callie Fielding, PT OT Present: Cherylynn Ridges, OT SLP Present: Other (comment) Lillie Columbia, SLP) PPS Coordinator present : Gunnar Fusi, SLP     Current Status/Progress Goal Weekly Team Focus  Bowel/Bladder   Pt is currently cont of Bowel. Last BM 10/22/20. Pt has a foley catheter placed by urology.  Pt to have regularity and cont. of B/B.  toliet pt q2h and PRN   Swallow/Nutrition/ Hydration   NPO with NG tube-will need PEG  Supervision for participation in dysphagia treatment  trials of ice chips   ADL's   Mod A UB ADLs, Mod/max A  LB ADLs, min A transfers, low endurance  Supervision  self-care retraining, activity tolerancen, OOB tolerance, sit<>stands, functional strengthening   Mobility   min A gait x 31f w/RW, transfers w/min to mod assist w/RW, min assist bed mobility  Supervision  Global strengthening and endurance training, increased time OOB, continue gait training, improving activity tolerance   Communication   #6 XL cuffless trach, PMSV during all waking hours-Mod A for use of verbal expression and increased vical intensity   Supervision  use of verbal expression vs gestures, increased vocal intensity   Safety/Cognition/ Behavioral Observations  Supervision-Min A  Supervision  recall with use of strategies and awareness   Pain   Pt states pain in back.  Pain < 3 out of 10 point scale.  Assess for pain qshift and Continue PRN pain medication.   Skin   Pt has a closed back insicision, skin tear on LFA and Trach site.  Pt skin is clean and intact with no signs of injurt or break down.  Assess skin qshift and preform skin care and pressure relieving measures.     Discharge Planning:  D/c to home with 24/7care from wife and two adult daughters who will rotate their schedule.   Team Discussion: Bowels have firmed up and the rectal tube is out. Would like to move forward with the PEG placement. Foley catheter now in place. 5-6/10 pain treated with Tylenol. Trouble sleeping, treated with Trazodone. Skin tears, wound to back. Working on home health, cost of tube feeds? Patient on target to meet rehab goals: Yes, min/mod assist upper body ADL's, mod/max assist lower body ADL's. Needs lots of encouragement. Walks about 16 ft and complains of exhaustion. SLP reports edema and tracheal stenosis, doing trials of ice chips. PMSV should be on during all waking hours.  *See Care Plan and progress notes for long and short-term goals.   Revisions to Treatment Plan:  Not at this time.  Teaching Needs: Family education, medication management, pain management, skin/wound care, trach care education, foley care education, transfer training, gait training, balance  training, endurance training, tube feed training, safety awareness.  Current Barriers to Discharge: Decreased caregiver support, Medical stability, Home enviroment access/layout, Trach, Incontinence, Wound care, Lack of/limited family support, Weight, Medication compliance, Behavior and Nutritional means  Possible Resolutions to Barriers: Continue current medications,  provide emotional support.     Medical Summary Current Status: tracheal stenosis, #6 XLT, ngt as he continues to have significant pharyngeal dysphagia. pain mgt  Barriers to Discharge: Medical stability   Possible Resolutions to Barriers/Weekly Focus: trach care, pain mgt, nutritional assessment, daily assessment of labs and pt data   Continued Need for Acute Rehabilitation Level of Care: The patient requires daily medical management by a physician with specialized training in physical medicine and rehabilitation for the following reasons: Direction of a multidisciplinary physical rehabilitation program to maximize functional independence : Yes Medical management of patient stability for increased activity during participation in an intensive rehabilitation regime.: Yes Analysis of laboratory values and/or radiology reports with any subsequent need for medication adjustment and/or medical intervention. : Yes   I attest that I was present, lead the team conference, and concur with the assessment and plan of the team.   Cristi Loron 10/25/2020, 6:40 PM

## 2020-10-25 NOTE — Progress Notes (Signed)
Physical Therapy Session Note  Patient Details  Name: Devin [name] MRN: [id number] Date of Birth: [birth year]  Today's Date: 10/25/2020 PT Individual Time: 1430-1500 PT Individual Time Calculation (min): 30 min  and Today's Date: 10/25/2020 PT Missed Time: 30 Minutes Missed Time Reason: Nursing care  Short Term Goals: Week 2:  PT Short Term Goal 1 (Week 2): Pt will perform bed mobility consistently with CGA. PT Short Term Goal 2 (Week 2): Pt will perform bed to chair consistently with CGA. PT Short Term Goal 3 (Week 2): Pt will ambulate 79' with minA and LRAD.  Skilled Therapeutic Interventions/Progress Updates:     Pt misses 30 minutes of skilled PT due to RN staff in room attempting to "unclog" NG tube. Upon PT re-entry, pt seated in [facility] and agreeable to therapy. No complaint of pain. Pt on 6L O2 via trach collar with FiO2 28%. Pt performs sit to stand with minA and ambulates x20' in room. Following extended seated rest break, pt ambulates additional bout of 110' with minA and +2 for WC follow. Pt's O2 sats following ambulation at 99%. Pt verbalizes that leg fatigue was limiting factor for ambulation. WC transport back to room. Pt left seated in [facility] with RN staff present.  Therapy Documentation Precautions:  Precautions Precautions: Fall,Back Precaution Booklet Issued: No Precaution Comments: rectal tube, trach collar, cortrak, TLSO Required Braces or Orthoses: Spinal Brace Spinal Brace: Thoracolumbosacral orthotic,Applied in sitting position Restrictions Weight Bearing Restrictions: No General: PT Amount of Missed Time (min): 30 Minutes PT Missed Treatment Reason: Nursing care   Therapy/Group: Individual Therapy  Breck Coons, PT, DPT 10/25/2020, 3:32 PM

## 2020-10-25 NOTE — Progress Notes (Signed)
PROGRESS NOTE   Subjective/Complaints: Slept better last night. No new issues this morning. Apparently IR cannot place PEG d/t mesh between stomach and anterior abdominal wall, confirmed on CT.   ROS: Patient denies fever, rash, sore throat, blurred vision, nausea, vomiting, diarrhea, cough,   headache, or mood change.    Objective:   CT ABDOMEN WO CONTRAST  Result Date: 10/25/2020 CLINICAL DATA:  Assess anatomy for G-tube placement EXAM: CT ABDOMEN WITHOUT CONTRAST TECHNIQUE: Multidetector CT imaging of the abdomen was performed following the standard protocol without IV contrast. COMPARISON:  09/10/2020 FINDINGS: Lower chest: Small bilateral pleural effusions. Linear opacities at the lung bases likely due to atelectasis. There is airspace opacity in the right lower lobe suspicious for pneumonia. Hepatobiliary: Calcification located in the subcapsular region of the posterior right hepatic lobe may be due to prior granulomatous inflammation. The liver otherwise unremarkable. The gallbladder is absent. Pancreas: Unremarkable. No pancreatic ductal dilatation or surrounding inflammatory changes. Spleen: Normal in size without focal abnormality. Adrenals/Urinary Tract: Single punctate bilateral nonobstructing renal calculi. Kidneys and visualized segments of the ureters otherwise unremarkable. No significant abnormality of the adrenal glands. Stomach/Bowel: NG tube terminates in the distal gastric lumen. 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 No dilated bowel loops noted within the visualized abdomen. Vascular/Lymphatic: No enlarged abdominal lymph nodes. Atherosclerotic plaque seen scattered throughout the abdominal aorta. Other: Anterior abdominal wall surgical mesh material is noted. Areas of subcutaneous fat stranding in the anterior abdominal wall subcutaneous fat likely sites of medication administration. Musculoskeletal: Diffuse osteopenia  extensive posterior fusion changes of the thoracolumbar spine. Fracture noted through the inferior endplate of X83 vertebral body. IMPRESSION: 1. Surgical mesh material interposed between the stomach and anterior abdominal wall. 2. Right lower lobe airspace opacities suspicious for pneumonia. Electronically Signed   By: Miachel Roux M.D.   On: 10/25/2020 08:28   Recent Labs    10/23/20 1033 10/25/20 0524  WBC 14.7* 9.4  HGB 9.4* 8.8*  HCT 30.9* 29.4*  PLT 263 227   Recent Labs    10/23/20 1033 10/25/20 0524  NA 129* 130*  K 5.1 5.1  CL 90* 92*  CO2 32 34*  GLUCOSE 195* 169*  BUN 42* 41*  CREATININE 0.99 1.00  CALCIUM 9.1 8.9    Intake/Output Summary (Last 24 hours) at 10/25/2020 0835 Last data filed at 10/25/2020 0531 Gross per 24 hour  Intake --  Output 2150 ml  Net -2150 ml        Physical Exam: Vital Signs Blood pressure (!) 118/52, pulse 64, temperature (!) 97.5 F (36.4 C), resp. rate 16, weight 85 kg, SpO2 100 %.     Constitutional: No distress . Vital signs reviewed. HEENT: EOMI, oral membranes moist Neck: supple, #6 XLT Cardiovascular: RRR without murmur. No JVD    Respiratory/Chest: scattered rhonchi, non-labored    GI/Abdomen: BS +, non-tender, non-distended Ext: no clubbing, cyanosis, or edema Psych: pleasant and cooperative Uro: foley in place, with clear,yellow urine Skin:  Scattered bruises on UE's. Back incision CDI, abrasion along old incision, foam  Neuro: awake, alert and appropriate.  No focal CN abnl except decreased cough. Reflexes are 1+ in all 4's.. UE grossly 3/5  prox to distal. LE: 2/5 HF, KE and 3/5 ADF/APF no motor changes today. Senses LT/P in both LE's Musculoskeletal: LB TTP   Assessment/Plan: 1. Functional deficits which require 3+ hours per day of interdisciplinary therapy in a comprehensive inpatient rehab setting.  Physiatrist is providing close team supervision and 24 hour management of active medical problems listed  below.  Physiatrist and rehab team continue to assess barriers to discharge/monitor patient progress toward functional and medical goals  Care Tool:  Bathing    Body parts bathed by patient: Right arm,Left arm,Chest,Abdomen,Face,Right upper leg,Left upper leg   Body parts bathed by helper: Front perineal area,Buttocks,Right lower leg,Left lower leg     Bathing assist Assist Level: Moderate Assistance - Patient 50 - 74%     Upper Body Dressing/Undressing Upper body dressing   What is the patient wearing?: Hospital gown only    Upper body assist Assist Level: Moderate Assistance - Patient 50 - 74%    Lower Body Dressing/Undressing Lower body dressing      What is the patient wearing?: Incontinence brief     Lower body assist Assist for lower body dressing: Total Assistance - Patient < 25%     Toileting Toileting    Toileting assist Assist for toileting: 2 Helpers     Transfers Chair/bed transfer  Transfers assist  Chair/bed transfer activity did not occur: Safety/medical concerns  Chair/bed transfer assist level: Minimal Assistance - Patient > 75% Chair/bed transfer assistive device: Programmer, multimedia   Ambulation assist      Assist level: Minimal Assistance - Patient > 75% Assistive device: Walker-rolling Max distance: 15   Walk 10 feet activity   Assist  Walk 10 feet activity did not occur: Safety/medical concerns  Assist level: Minimal Assistance - Patient > 75% Assistive device: Walker-rolling   Walk 50 feet activity   Assist Walk 50 feet with 2 turns activity did not occur: Safety/medical concerns         Walk 150 feet activity   Assist Walk 150 feet activity did not occur: Safety/medical concerns         Walk 10 feet on uneven surface  activity   Assist Walk 10 feet on uneven surfaces activity did not occur: Safety/medical concerns         Wheelchair     Assist Will patient use wheelchair at  discharge?: No             Wheelchair 50 feet with 2 turns activity    Assist            Wheelchair 150 feet activity     Assist          Blood pressure (!) 118/52, pulse 64, temperature (!) 97.5 F (36.4 C), resp. rate 16, weight 85 kg, SpO2 100 %.  Medical Problem List and Plan: 1.  T11/12 compression fx s/p posterior fusion secondary to fall with debility due to asp PNA/trach/generalzied weakness             -patient may not shower             -ELOS/Goals: 2-3 weeks- min A  -Continue CIR therapies including PT, OT, and SLP   2.  Antithrombotics: -DVT/anticoagulation:  Pharmaceutical: Other (comment)--Apixaban bid.              -antiplatelet therapy: N/A 3. Pain Management:  hydrocodone prn for severe pain  - Tramadol prn for moderate pain.  5/17 - fair to reasonable pain control  4. Mood/anxiety/sleep: LCSW to follow for evaluation and support.              -antipsychotic agents: N/A  -xanax bid prn anxiety  -continue trazodone for sleep 5. Neuropsych: This patient is not fully capable of making decisions on his own behalf. 6. Skin/Wound Care: Pressure relief measures.  7. Fluids/Electrolytes/Nutrition: NPO with tube feeds for nutritional support.             5/16 BUN/Cr stable to improved yesterday   -borderline hyperkalemia is stable at 5.1 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               --?inflammatory subglottic mucosal, negative for malignancy             --chest PT, sx, OOB  -vest to help with secretions  5/18 secretions have been better. PCCM following   -downsize week of 5/23 to coincide with f/u bronch     9. Acute on chronic respiratory failure: Encourage pulmonary hygiene              --continue Pulmicort nebs bid.  10.  PAF:  Monitor HR tid--continue Atenolol bid with Cardizem tid.   -HR controlled 5/9 10. Hyponatremia: Has resolved  11. Acute blood loss anemia, anemia  chronic disease: Monitor with serial checks.   -hgb 8.4 5/6 12. Leukocytosis: WBC continues to fluctuate.   -no new signs of infection  -5/16 14.7  Repeat CBC tomorrow 13. Crohn's disease:    - once able to swallow pills, can try adding home Crohn's meds to help.    -continue florastor and questran  5/16 stools type 5 yesterday   -imodium decreased. Continue questran   -rectal tube out!   -dc pentasa as nursing can't get it down cortrak tube 14.  Bilateral pleural effusion: Pulmonary hygiene w/ flutter valve.              --continue to monitor for symptoms with increase in activity.   15. Aspiration PNA: zosyn completed 5/5 16. Dysphagia: Continue NPO with Glucerna               -MBS 5/12 per SLP demonstrated no improvement in peri-glottic mass/area nor improvement in swallow  -5/18 IR unable to place PEG d/t mesh in anterior abdomen over stomach   -will discuss with general surgery 17. T2DM: Was on Humalog tid prior to admission.             --will monitor BS every 4 hours as on tube feeds and use SSI prn    CBG (last 3)  Recent Labs    10/25/20 0001 10/25/20 0425 10/25/20 0804  GLUCAP 155* 178* 166*  5/18,fair control. Consider adding lantus  18. Urine retention: appreciate urology placement of foley catheter  -continue for this week at least.       LOS: 13 days A FACE TO [place] 10/25/2020, 8:35 AM

## 2020-10-25 NOTE — Procedures (Signed)
Cortrak  Tube Type:  Cortrak - 43 inches Tube Location:  Right nare Initial Placement:  Stomach Secured by: Bridle Technique Used to Measure Tube Placement:  Documented cm marking at nare/ corner of mouth Cortrak Secured At:  73 cm    Cortrak Tube Team Note:  Consult received to place a Cortrak feeding tube.   X-ray is required, abdominal x-ray has been ordered by the Cortrak team. Please confirm tube placement before using the Cortrak tube.   If the tube becomes dislodged please keep the tube and contact the Cortrak team at [email or web address] (password [email or web address]) for replacement.  If after hours and replacement cannot be delayed, place a NG tube and confirm placement with an abdominal x-ray.    Koleen Distance MS, RD, LDN Please refer to [facility] for RD and/or RD on-call/weekend/after hours pager

## 2020-10-25 NOTE — Progress Notes (Signed)
Occupational Therapy Session Note  Patient Details  Name: Devin [name] MRN: [id number] Date of Birth: January 23, 1941  Today's Date: 10/25/2020 OT Individual Time: 1030-1130 OT Individual Time Calculation (min): 60 min    Short Term Goals: Week 1:  OT Short Term Goal 1 (Week 1): Pt will complete bathing with mod assist at sit > stand level OT Short Term Goal 1 - Progress (Week 1): Progressing toward goal OT Short Term Goal 2 (Week 1): Pt will don shirt with min assist OT Short Term Goal 3 (Week 1): Pt will complete toilet transfers with mod assist with LRAD OT Short Term Goal 3 - Progress (Week 1): Met  Skilled Therapeutic Interventions/Progress Updates:    1:1. Pt and daughter present for session. Pt initially asleep and requrires increased time and encouragement to wake up with HOH A to wash face. Pt sup>sit with MOD A d/t pain with VC for back precautions. Pt completes changing shirt at EOB with total A for doffing and donning d/t multiple lines/leads. Pt reporting need to toilet and completes stand pivot transfer with RW to/from commode with MIN A overall. Pt pants changed and pt requires increased time to void with O2 variable at [facility] with stool under feet remaining >85% on 5L via trach collar. Pt requires total A for toileting components and returns to bed, exit alarm on and call light in reach awaiting PT  Therapy Documentation Precautions:  Precautions Precautions: Fall,Back Precaution Booklet Issued: No Precaution Comments: rectal tube, trach collar, cortrak, TLSO Required Braces or Orthoses: Spinal Brace Spinal Brace: Thoracolumbosacral orthotic,Applied in sitting position Restrictions Weight Bearing Restrictions: No General:   Vital Signs: Therapy Vitals Temp: (!) 97.5 F (36.4 C) Pulse Rate: 62 Resp: 18 BP: (!) 118/52 Patient Position (if appropriate): Lying Oxygen Therapy SpO2: 100 % O2 Device: Tracheostomy Collar O2 Flow Rate (L/min): 5 L/min FiO2 (%): 28  % Pain:   ADL: ADL Grooming: Supervision/safety Where Assessed-Grooming: Edge of bed Upper Body Bathing: Contact guard Where Assessed-Upper Body Bathing: Edge of bed Lower Body Bathing: Dependent Where Assessed-Lower Body Bathing: Bed level Upper Body Dressing: Minimal assistance Where Assessed-Upper Body Dressing: Edge of bed Lower Body Dressing: Maximal assistance Vision   Perception    Praxis   Exercises:   Other Treatments:     Therapy/Group: Individual Therapy  Tonny Branch 10/25/2020, 6:53 AM

## 2020-10-26 DIAGNOSIS — R0902 Hypoxemia: Secondary | ICD-10-CM

## 2020-10-26 LAB — GLUCOSE, CAPILLARY
Glucose-Capillary: 149 mg/dL — ABNORMAL HIGH (ref 70–99)
Glucose-Capillary: 153 mg/dL — ABNORMAL HIGH (ref 70–99)
Glucose-Capillary: 161 mg/dL — ABNORMAL HIGH (ref 70–99)
Glucose-Capillary: 168 mg/dL — ABNORMAL HIGH (ref 70–99)
Glucose-Capillary: 174 mg/dL — ABNORMAL HIGH (ref 70–99)
Glucose-Capillary: 177 mg/dL — ABNORMAL HIGH (ref 70–99)

## 2020-10-26 LAB — APTT: aPTT: 42 seconds — ABNORMAL HIGH (ref 24–36)

## 2020-10-26 LAB — HEPARIN LEVEL (UNFRACTIONATED): Heparin Unfractionated: >1.1 IU/mL — ABNORMAL HIGH (ref 0.30–0.70)

## 2020-10-26 MED ORDER — HEPARIN (PORCINE) 25000 UT/250ML-% IV SOLN
1150.0000 [IU]/h | INTRAVENOUS | Status: DC
  Administered 2020-10-26: 1200 [IU]/h via INTRAVENOUS
  Administered 2020-10-27: 1300 [IU]/h via INTRAVENOUS
  Administered 2020-10-28: 1250 [IU]/h via INTRAVENOUS
  Administered 2020-10-29: 1050 [IU]/h via INTRAVENOUS
  Filled 2020-10-26 (×6): qty 250

## 2020-10-26 NOTE — Progress Notes (Signed)
Nutrition Follow-up  DOCUMENTATION CODES:   Non-severe (moderate) malnutrition in context of chronic illness  INTERVENTION:  Continue Osmolite 1.5 cal formula via Cortrak NGT at goal rate of 75 ml/hr x 20 hours (may hold TF for up to 4 hours for therapy)  Continue 45 ml Prosource TF BID per tube.   Free water flushes of 225 ml q 4 hours per tube. (MD to adjust as appropriate).  Tube feeding regimen provides 2330 kcal, 116 grams of protein, 2490 ml free water.   Continue Juven BID per tube, each packet provides 95 calories, 2.5 grams of protein for wound healing.  NUTRITION DIAGNOSIS:   Moderate Malnutrition related to chronic illness (Crohn's disease, lung cancer) as evidenced by moderate fat depletion,severe muscle depletion; ongoing  GOAL:   Patient will meet greater than or equal to 90% of their needs; met with TF  MONITOR:   TF tolerance,Skin,Weight trends,Labs,I & O's  REASON FOR ASSESSMENT:   Consult Enteral/tube feeding initiation and management  ASSESSMENT:   80 year old male with PMH of HTN, T2DM, Crohn's disease, atrial fibrillation, lung cancer s/p LUL lobectomy, ankylosing spondylosis who sustained a fall on 09/07/20 with progressive pain. Pt was admitted on 09/10/20 with acute on chronic hypercarbic respiratory failure, T11 Chance fracture, and agitation. Pt underwent posterior thoracic fusion T11-T12 on 4/8. Pt ultimately required tracheostomy on 4/12. Pt was initially admitted to CIR on 4/29. On 4/30, Pt had pulled out his feeding tube and NG tube was extremely difficult to place and tube feeds were held with subsequent hypoglycemic episodes. There was concern for sepsis and pt was transferred to acute care for management. Pt readmitted to CIR on 5/5.   MBS with recommendations to continue NPO and consider long-term enteral access. Cortrak replaced 5/18 after previous NGT was occluded. Tip of tube in stomach.   Trach capping trial initiated today. Pt continues on  NPO status. Tube feeds infusing and pt has been tolerating it well via Cortrak NGT. Plans for permanent access feeding tube. Per MD, IR unable to place PEG due to mesh in anterior abdomen over stomach. MD to plans to discussed G-tube with family. Recommend continuation of current tube feeding regimen.   Labs and medications reviewed. Potassium stable at 5.1.  Diet Order:   Diet Order            Diet NPO time specified  Diet effective now                 EDUCATION NEEDS:   Education needs have been addressed  Skin:  Skin Assessment: Reviewed RN Assessment Skin Integrity Issues:: Stage I,Other (Comment),Incisions Stage I: back Incisions: back, lip Other: L hand, R forearm, L arm  Last BM:  5/18  Height:   Ht Readings from Last 1 Encounters:  09/10/20 5' 9.02" (1.753 m)    Weight:   Wt Readings from Last 1 Encounters:  10/26/20 85 kg   BMI:  Body mass index is 27.66 kg/m.  Estimated Nutritional Needs:   Kcal:  2000-2200  Protein:  100-120 grams  Fluid:  >/= 2.0 L  Corrin Parker, MS, RD, LDN RD pager number/after hours weekend pager number on Amion.

## 2020-10-26 NOTE — Progress Notes (Signed)
Patient ID: Devin [name], male   DOB: 07/05/1940, 80 y.o.   MRN: [id number]  SW waiting on follow-up [facility] about HHPT/OT/SLP/aide/SN referral.  *referral declined as unable to take trach pt.   SW sent referral to [facility] and waiting on follow-up.  Loralee Pacas, MSW, [place] Office: [phone] Cell: [phone] Fax: [phone]

## 2020-10-26 NOTE — Progress Notes (Signed)
ANTICOAGULATION CONSULT NOTE - Initial Consult  Pharmacy Consult for transition apixaban to heparin in anticipation of surgery Indication: atrial fibrillation  No Known Allergies  Patient Measurements: Weight: 85 kg (187 lb 6.3 oz) Heparin Dosing Weight: 85 kg  Vital Signs: Temp: 97.5 F (36.4 C) (05/19 1152) BP: 111/53 (05/19 1152) Pulse Rate: 62 (05/19 1200)  Labs: Recent Labs    10/25/20 0524  HGB 8.8*  HCT 29.4*  PLT 227  CREATININE 1.00    Estimated Creatinine Clearance: 66.9 mL/min (by C-G formula based on SCr of 1 mg/dL).   Medical History: Past Medical History:  Diagnosis Date  . A-fib ([place])   . 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 Ankylosing spondylitis ([place])    Diagnosed during lumbar fracture summer of 2016    . Crohn's disease ([place])   . Gout   . HIstory of basal cell cancer of face    THYROID CA HX  . History of kidney stones   . Hypertension   . Hypothyroidism   . Impotence   . 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 Obesity (BMI 30-39.9)   . Osteoporosis    Pt completed 5 years of fosamax in 2013     . Prostate cancer with recurrence    Treated with prostatectomy with recurrence 2012 with Lupron treatment now him   . Psoriasis   . Thyroid cancer ([place]) 1999   Treated with RAI and total thyroidectomy   . Type II diabetes mellitus ([facility])     Medications:  Medications Prior to Admission  Medication Sig Dispense Refill Last Dose  . acetaminophen (TYLENOL) 160 MG/5ML solution Place 15.6 mLs (500 mg total) into feeding tube every 6 (six) hours. 120 mL 0   . acetylcysteine (MUCOMYST) 20 % nebulizer solution Take 3 mLs (600 mg total) by nebulization 2 (two) times daily. 30 mL 12   . albuterol (VENTOLIN HFA) 108 (90 Base) MCG/ACT inhaler Inhale 2 puffs into the lungs every 6 (six) hours as needed for wheezing or shortness of breath. 8 g 6   . ALPRAZolam (XANAX) 0.5 MG tablet Take 0.5 mg by mouth 2 (two)  times daily as needed for anxiety.     Marland Kitchen apixaban (ELIQUIS) 5 MG TABS tablet Take 5 mg by mouth 2 (two) times daily.     Marland Kitchen atenolol (TENORMIN) 25 MG tablet Take 1 tablet (25 mg total) by mouth 2 (two) times daily.     . budesonide (PULMICORT) 0.5 MG/2ML nebulizer solution Take 2 mLs (0.5 mg total) by nebulization 2 (two) times daily.  12   . cholestyramine (QUESTRAN) 4 g packet Place 1 packet (4 g total) into feeding tube 2 (two) times daily before lunch and supper. 60 each 12   . diltiazem (CARDIZEM) 10 mg/ml oral suspension Place 3 mLs (30 mg total) into feeding tube every 6 (six) hours.     . diphenhydrAMINE (BENADRYL) 25 mg capsule 1 capsule (25 mg total) by Per NG tube route at bedtime as needed for sleep. 30 capsule 0   . fiber (NUTRISOURCE FIBER) PACK packet Place 1 packet into feeding tube 2 (two) times daily.     . insulin aspart (NOVOLOG) 100 UNIT/ML injection Inject 0-20 Units into the skin every 4 (four) hours. 10 mL 11   . insulin glargine (LANTUS) 100 UNIT/ML injection Inject 0.12 mLs (12 Units total) into the skin 2 (two) times daily. 10 mL 11   . lactobacillus (FLORANEX/LACTINEX) PACK  Place 1 packet (1 g total) into feeding tube 3 (three) times daily with meals.     Marland Kitchen levothyroxine (SYNTHROID) 125 MCG tablet Take 1 tablet (125 mcg total) by mouth daily before breakfast. 30 tablet 0   . loperamide HCl (IMODIUM) 1 MG/7.5ML suspension Place 15 mLs (2 mg total) into feeding tube 2 (two) times daily as needed for diarrhea or loose stools. 120 mL 0   . loperamide HCl (IMODIUM) 1 MG/7.5ML suspension Place 30 mLs (4 mg total) into feeding tube 4 (four) times daily. 120 mL 0   . nutrition supplement, JUVEN, (JUVEN) PACK Place 1 packet into feeding tube 2 (two) times daily between meals.  0   . Nutritional Supplements (FEEDING SUPPLEMENT, PROSOURCE TF,) liquid Place 45 mLs into feeding tube daily.     Marland Kitchen Nystatin (GERHARDT'S BUTT CREAM) CREA Apply 1 application topically 2 (two) times daily.      . ondansetron (ZOFRAN) 4 MG/2ML SOLN injection Inject 2 mLs (4 mg total) into the vein every 6 (six) hours as needed for nausea or vomiting. 2 mL 0   . oxyCODONE (ROXICODONE) 5 MG/5ML solution Place 5 mLs (5 mg total) into feeding tube every 4 (four) hours as needed for moderate pain.  0   . phenol (CHLORASEPTIC) 1.4 % LIQD Use as directed 1 spray in the mouth or throat as needed for throat irritation / pain.  0   . piperacillin-tazobactam (ZOSYN) 3.375 GM/50ML IVPB Inject 50 mLs (3.375 g total) into the vein every 8 (eight) hours. 50 mL    . tamsulosin (FLOMAX) 0.4 MG CAPS capsule Take 1 capsule (0.4 mg total) by mouth daily. 30 capsule    . Water For Irrigation, Sterile (FREE WATER) SOLN Place 100 mLs into feeding tube every 8 (eight) hours.       Assessment: Devin [name] is a 80 year old male with history Afib on apixaban. Pharmacy is consulted to transition patient from apixaban to heparin for upcoming surgery. PTA apixaban dose given this morning at 0908. CBC stable from 5/18.   Goal of Therapy:  Heparin level 0.3-0.7 units/ml aPTT 66-102 seconds Monitor platelets by anticoagulation protocol: Yes   Plan:  Check baseline aptt and heparin level Start heparin infusion at 1200 units/hr @ 2100 Check anti-Xa level/aPTT 8 hours after starting infusion and daily while on heparin Continue to monitor H&H and platelets   Thank you for allowing [place] to participate in this patients care. Jens Som, PharmD 10/26/2020 1:11 PM  Please check [email or web address] for unit-specific pharmacy phone numbers.

## 2020-10-26 NOTE — Progress Notes (Signed)
   10/26/20 2025  Therapy Vitals  Pulse Rate 77  Resp 18  Patient Position (if appropriate) Lying  MEWS Score/Color  MEWS Score 0  MEWS Score Color Green  Respiratory Assessment  Assessment Type Assess only  Respiratory Pattern Regular;Unlabored  Chest Assessment Chest expansion symmetrical  Cough Congested  Sputum Specimen Source Spontaneous cough  Bilateral Breath Sounds Clear  R Upper  Breath Sounds Clear  L Upper Breath Sounds Clear  R Lower Breath Sounds Clear  L Lower Breath Sounds Clear  Oxygen Therapy/Pulse Ox  $ Pulse Ox Multi Deter  Yes  O2 Device Tracheostomy Collar;Room Air  O2 Therapy Room air  O2 Flow Rate (L/min) 5 L/min  FiO2 (%) 28 %  SpO2 94 %  Tracheostomy Shiley XLT Distal 6 mm Uncuffed;Distal  Placement Date/Time: 10/10/20 0900   Placed By: (c) Other (Comment);ICU physician  Brand: Shiley XLT Distal  Size (mm): 6 mm  Style: Uncuffed;Distal  Status Secured  Site Assessment Clean;Dry  Site Care Cleansed;Dried  Inner Cannula Care Cleansed/dried  Ties Assessment Clean;Dry;Secure  Cuff Pressure (cm H2O)  (cfs)  Tracheostomy Equipment at bedside Yes and checklist posted at head of bed  Respiratory  Airway LDA Tracheostomy    10/26/20 2025  Therapy Vitals  Pulse Rate 77  Resp 18  Patient Position (if appropriate) Lying  MEWS Score/Color  MEWS Score 0  MEWS Score Color Green  Respiratory Assessment  Assessment Type Assess only  Respiratory Pattern Regular;Unlabored  Chest Assessment Chest expansion symmetrical  Cough Congested  Sputum Specimen Source Spontaneous cough  Bilateral Breath Sounds Clear  R Upper  Breath Sounds Clear  L Upper Breath Sounds Clear  R Lower Breath Sounds Clear  L Lower Breath Sounds Clear  Oxygen Therapy/Pulse Ox  $ Pulse Ox Multi Deter  Yes  O2 Device Tracheostomy Collar;Room Air  O2 Therapy Room air  O2 Flow Rate (L/min) 5 L/min  FiO2 (%) 28 %  SpO2 94 %  Tracheostomy Shiley XLT Distal 6 mm Uncuffed;Distal   Placement Date/Time: 10/10/20 0900   Placed By: (c) Other (Comment);ICU physician  Brand: Shiley XLT Distal  Size (mm): 6 mm  Style: Uncuffed;Distal  Status Secured  Site Assessment Clean;Dry  Site Care Cleansed;Dried  Inner Cannula Care Cleansed/dried  Ties Assessment Clean;Dry;Secure  Cuff Pressure (cm H2O)  (cfs)  Tracheostomy Equipment at bedside Yes and checklist posted at head of bed  Respiratory  Airway LDA Tracheostomy  Uncapped pt. Placed on trach collar overnight per md order will cap again in the morning.

## 2020-10-26 NOTE — Progress Notes (Signed)
Speech Language Pathology Daily Session Note  Patient Details  Name: Devin [name] MRN: [id number] Date of Birth: 11-Sep-1940  Today's Date: 10/26/2020 SLP Individual Time: 1300-1345 SLP Individual Time Calculation (min): 45 min  Short Term Goals: Week 2: SLP Short Term Goal 1 (Week 2): Patient will initiate verbal responses vs use of gestures for communication with PMSV in place in 75% of opportunities with Mod verbal cues. SLP Short Term Goal 2 (Week 2): Patient will utilize an increased vocal intensity at the phrase level to achieve ~90% intelligibility with Mod A verbal cues. SLP Short Term Goal 3 (Week 2): Patient will recall swallowing and safety precautions for trials of ice chips wth Min A multimodal cues. SLP Short Term Goal 4 (Week 2): Patient will utilize external memory aids to recall new, daily information with supervision level verbal cues.  Skilled Therapeutic Interventions: Pt seen for skilled ST with focus on swallowing and cognition goals. Pt in bed, HOB raised to 72' before patient protest. PMSV on throughout session, pt voice initially wet sounding, encouraged to utilize throat clear and suction to clear. Once patient with clear voice, pt completing oral care with min A and trials of ice chips initiated. Pt accepting 5 ice chips with immediate throat clear following 100% of swallows. Question some habitual throat clearing at this point, however pt states it is because he feels liquid "going the wrong way". Ice chips appear to loosen pharyngeal secretions which pt able to orally expectorate/suction. O2 stats remain >90% with all trials. SLP facilitating simple recall task with mod cues for use of external/environmental aids to increase carryover of information. Pt appears to be clearing cognitively, reports he does not feel he is quite as baseline. No family present at this time to confirm. Pt left in bed with nursing present, cont ST POC.  Pain Pain Assessment Pain Scale:  0-10 Pain Score: 3  Pain Type: Acute pain;Surgical pain Pain Location: Back Pain Orientation: Lower Pain Descriptors / Indicators: Aching Pain Frequency: Intermittent Pain Onset: With Activity Patients Stated Pain Goal: 2 Pain Intervention(s): Medication (See eMAR)  Therapy/Group: Individual Therapy  [name] 10/26/2020, 1:29 PM

## 2020-10-26 NOTE — Progress Notes (Signed)
Occupational Therapy Weekly Progress Note  Patient Details  Name: Devin [name] MRN: [id number] Date of Birth: 1940-07-18  Beginning of progress report period: Oct 13, 2020 End of progress report period: Oct 27, 2020  Today's Date: 10/27/2020 OT Individual Time: 4445-8483 OT Individual Time Calculation (min): 58 min    Patient has met 3 of 3 short term goals.  Patient is making steady progress towards OT goals. Pt can complete transfers and functional ambulation with min/CGA and RW, His biggest barrier is his very poor endurance. This low endurance continues to limit patients participation in BADL tasks. Continue current POC.  Patient continues to demonstrate the following deficits: muscle weakness, decreased cardiorespiratoy endurance and decreased oxygen support and decreased sitting balance, decreased standing balance, decreased postural control and decreased balance strategies and therefore will continue to benefit from skilled OT intervention to enhance overall performance with BADL and Reduce care partner burden.  Patient progressing toward long term goals..  Continue plan of care.  OT Short Term Goals Week 2:  OT Short Term Goal 1 (Week 2): Pt will complete bathing with mod assist at sit > stand level OT Short Term Goal 1 - Progress (Week 2): Met OT Short Term Goal 2 (Week 2): Pt will don shirt with min assist OT Short Term Goal 2 - Progress (Week 2): Met OT Short Term Goal 3 (Week 2): Pt will complete toilet transfer with min A and LRAD OT Short Term Goal 3 - Progress (Week 2): Met Week 3:  OT Short Term Goal 1 (Week 3): Patient will tolerate standing at the sink for 2 minutes in prep for BADL tasks OT Short Term Goal 2 (Week 3): Pt will complete 1 step of LB dressing task OT Short Term Goal 3 (Week 3): Patient will complete 1 step of LB dressing task.  Skilled Therapeutic Interventions/Progress Updates:    Pt greeted semi-reclined I bed, reluctant to participating in OT. Pt  needed max encouragement from OT and wife to get OOB. Pt agreeable to get up to the bathroom and change pants. Pt completed bed mobility with HOB elevated and supervision. He took rest break and TLSO donned in sitting with OT assist. CGA sit<>stand w/ RW, then CGA to ambulate into bathroom. Trach capped and pt on RA throughout session. Pt able to manage clothing with CGA. Pt sat on BSC over toilet and had successful BM with extra time. Encouraged pt to try to perform peri-care but he refused. OT assist to thread pants 2/2 catheter. Sit<>stand from commode with min A. CGA to return to bed w/ RW. Pt left semi-reclined in bed with needs met.   Therapy Documentation Precautions:  Precautions Precautions: Fall,Back Precaution Booklet Issued: No Precaution Comments: rectal tube, trach collar, cortrak, TLSO Required Braces or Orthoses: Spinal Brace Spinal Brace: Thoracolumbosacral orthotic,Applied in sitting position Restrictions Weight Bearing Restrictions: No Pain: Pain Assessment Pain Scale: 0-10 Pain Score: Asleep  Therapy/Group: Individual Therapy  [name] 10/26/2020, 10:21 PM

## 2020-10-26 NOTE — Progress Notes (Signed)
NAME:  AUNDRA ESPIN, MRN:  [id number], DOB:  Jul 30, 1940, LOS: 68 ADMISSION DATE:  10/12/2020, CONSULTATION DATE:  5/3 REFERRING MD:  Verlon Au, CHIEF COMPLAINT:  Dyspnea   History of Present Illness:  This is a 80 year old male who we had been following s/p fall resulting in T11/12 fracture requiring requiring fusion and complicated by prolonged mechanical ventilation and failed extubation attempt x 2 requiring trach. He had been dc'd to rehab. Re-admitted to hospital  after cor trak dislodged and had aspiration PNA c/b hypoglycemia and delirium.  Critical care emergently consulted 2/2 trach obstruction on 5/3  Pertinent  Medical History  afib on chronic ac, DM type II, prior lobectomy (LUL), chron's disease, thyroid cancer, prostate cancer, T11-T12 fracture, required posterior fusion, failed extubation 2/2 upper-airway obstruction. Required trach. Went to rehab.   Recent soft tissue mass at level of supraglottic larynx->neg for malignancy (this was incidentally identified initially on MBS showing thickening prevertebral tissue w/ CT head/neck showing prevertebral soft tissue mass concerning for malignancy   Significant Hospital Events: Including procedures, antibiotic start and stop dates in addition to other pertinent events    4/29 admitted for cortrak malposition, aspiration and encephalopathy  5/3 PCCM emergently consulted for inability to suction and upper airway obstruction Had sig increase of inflammatory tissue on posterior wall of trachea. Was able to bypass with distal XLT. Still has some intermittent tracheomalacia w/ dynamic collapse of airway noted w/ cough   . 5/9 in rehab. Voice quality better. Feeling stronger  . 5/16 foley placed by urology. working with OT, talking   Interim History / Subjective:  On PMV all day for the past week. Per RT- not needing rescue suctioning, minimal secretions during routine trach changes. Coughing up any secretions and suctioning out of his  mouth himself.   Objective   Blood pressure (!) 111/53, pulse 62, temperature (!) 97.5 F (36.4 C), resp. rate 16, weight 85 kg, SpO2 100 %.    FiO2 (%):  [28 %] 28 %   Intake/Output Summary (Last 24 hours) at 10/26/2020 1442 Last data filed at 10/26/2020 1300 Gross per 24 hour  Intake 90 ml  Output 1300 ml  Net -1210 ml   Filed Weights   10/22/20 0326 10/26/20 0351  Weight: 85 kg 85 kg    Examination: General:  Chronically ill appearing man sitting up in bed in NAD HENT: [place]/AT, eyes anicteric.  Neck: trach in place- Shiley prox XLT #6. Pulm: breathing comfortably on TC, CTAB. 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 Often times this can resolve with repositioning of trach which we did by placing XLT distal) -improved phonation quality with distal xlt   Plan: -Following 1-2x/wk  -Routine trach care. Since he has done well with PMV all week, we will try capping.  -Trach change week of 5/23 with bronch (Likely in endo, [facility] pccm)   -Might need DL to evaluate supraglottal and further eval of subglottal area by ENT to ensure he doesn't need dilation or laser. Will see how he tolerates capping trials since he has done so well on PMV. -Cont current rehab focus    Best practice (right click and "Reselect all SmartList Selections"  daily)   Per primary   Julian Hy, DO 10/26/20 3:40 PM  Pulmonary & Critical Care  For contact information, see Amion. If no response to pager, please call PCCM consult pager. After hours, 7PM- 7AM, please call Elink.

## 2020-10-26 NOTE — Progress Notes (Signed)
PROGRESS NOTE   Subjective/Complaints: Had another good night. Cortrak replaced after it occluded. Discussion re: g-tube noted. Increased activity yesterday in therapy.  ROS: Patient denies fever, rash, sore throat, blurred vision, nausea, vomiting, diarrhea,   chest pain, joint or back pain, headache, or mood change.    Objective:   CT ABDOMEN WO CONTRAST  Result Date: 10/25/2020 CLINICAL DATA:  Assess anatomy for G-tube placement EXAM: CT ABDOMEN WITHOUT CONTRAST TECHNIQUE: Multidetector CT imaging of the abdomen was performed following the standard protocol without IV contrast. COMPARISON:  09/10/2020 FINDINGS: Lower chest: Small bilateral pleural effusions. Linear opacities at the lung bases likely due to atelectasis. There is airspace opacity in the right lower lobe suspicious for pneumonia. Hepatobiliary: Calcification located in the subcapsular region of the posterior right hepatic lobe may be due to prior granulomatous inflammation. The liver otherwise unremarkable. The gallbladder is absent. Pancreas: Unremarkable. No pancreatic ductal dilatation or surrounding inflammatory changes. Spleen: Normal in size without focal abnormality. Adrenals/Urinary Tract: Single punctate bilateral nonobstructing renal calculi. Kidneys and visualized segments of the ureters otherwise unremarkable. No significant abnormality of the adrenal glands. Stomach/Bowel: NG tube terminates in the distal gastric lumen. 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 No dilated bowel loops noted within the visualized abdomen. Vascular/Lymphatic: No enlarged abdominal lymph nodes. Atherosclerotic plaque seen scattered throughout the abdominal aorta. Other: Anterior abdominal wall surgical mesh material is noted. Areas of subcutaneous fat stranding in the anterior abdominal wall subcutaneous fat likely sites of medication administration. Musculoskeletal: Diffuse osteopenia  extensive posterior fusion changes of the thoracolumbar spine. Fracture noted through the inferior endplate of J44 vertebral body. IMPRESSION: 1. Surgical mesh material interposed between the stomach and anterior abdominal wall. 2. Right lower lobe airspace opacities suspicious for pneumonia. Electronically Signed   By: Miachel Roux M.D.   On: 10/25/2020 08:28   DG Abd Portable 1V  Result Date: 10/25/2020 CLINICAL DATA:  Feeding tube placement EXAM: PORTABLE ABDOMEN - 1 VIEW COMPARISON:  10/15/2020 FINDINGS: Extensive spinal hardware redemonstrated. Esophageal tube tip overlies the gastroduodenal junction. Evidence of prior hernia repair. Visible gas pattern is unobstructed. IMPRESSION: Feeding tube tip overlies the gastroduodenal region. Electronically Signed   By: Donavan Foil M.D.   On: 10/25/2020 16:46   Recent Labs    10/23/20 1033 10/25/20 0524  WBC 14.7* 9.4  HGB 9.4* 8.8*  HCT 30.9* 29.4*  PLT 263 227   Recent Labs    10/23/20 1033 10/25/20 0524  NA 129* 130*  K 5.1 5.1  CL 90* 92*  CO2 32 34*  GLUCOSE 195* 169*  BUN 42* 41*  CREATININE 0.99 1.00  CALCIUM 9.1 8.9    Intake/Output Summary (Last 24 hours) at 10/26/2020 0909 Last data filed at 10/26/2020 0842 Gross per 24 hour  Intake 0 ml  Output 1300 ml  Net -1300 ml        Physical Exam: Vital Signs Blood pressure (!) 116/56, pulse 67, temperature 97.6 F (36.4 C), resp. rate 20, weight 85 kg, SpO2 98 %.     Constitutional: No distress . Vital signs reviewed. HEENT: EOMI, oral membranes moist, NGT Neck: supple Cardiovascular: RRR without murmur. No JVD    Respiratory/Chest:  a few rhonchi. No distress. Shallow breaths GI/Abdomen: BS +, non-tender, non-distended Ext: no clubbing, cyanosis, or edema Psych: pleasant and cooperative Uro: foley in place, with clear,yellow urine Skin:  Scattered bruises on UE's. Back incision CDI, abrasion along old incision, foam  Neuro: awake, alert and appropriate.  No  focal CN abnl except decreased cough. Reflexes are 1+ in all 4's.. UE grossly 3/5 prox to distal. LE: 2/5 HF, KE and 3/5 ADF/APF no changes today. Senses LT/P in both LE's Musculoskeletal: LB TTP   Assessment/Plan: 1. Functional deficits which require 3+ hours per day of interdisciplinary therapy in a comprehensive inpatient rehab setting.  Physiatrist is providing close team supervision and 24 hour management of active medical problems listed below.  Physiatrist and rehab team continue to assess barriers to discharge/monitor patient progress toward functional and medical goals  Care Tool:  Bathing    Body parts bathed by patient: Right arm,Left arm,Chest,Abdomen,Face,Right upper leg,Left upper leg   Body parts bathed by helper: Front perineal area,Buttocks,Right lower leg,Left lower leg     Bathing assist Assist Level: Moderate Assistance - Patient 50 - 74%     Upper Body Dressing/Undressing Upper body dressing   What is the patient wearing?: Hospital gown only    Upper body assist Assist Level: Moderate Assistance - Patient 50 - 74%    Lower Body Dressing/Undressing Lower body dressing      What is the patient wearing?: Incontinence brief     Lower body assist Assist for lower body dressing: Total Assistance - Patient < 25%     Toileting Toileting    Toileting assist Assist for toileting: 2 Helpers     Transfers Chair/bed transfer  Transfers assist  Chair/bed transfer activity did not occur: Safety/medical concerns  Chair/bed transfer assist level: Minimal Assistance - Patient > 75% Chair/bed transfer assistive device: Programmer, multimedia   Ambulation assist      Assist level: Minimal Assistance - Patient > 75% Assistive device: Walker-rolling Max distance: 110'   Walk 10 feet activity   Assist  Walk 10 feet activity did not occur: Safety/medical concerns  Assist level: Minimal Assistance - Patient > 75% Assistive device:  Walker-rolling   Walk 50 feet activity   Assist Walk 50 feet with 2 turns activity did not occur: Safety/medical concerns  Assist level: Minimal Assistance - Patient > 75% Assistive device: Walker-rolling    Walk 150 feet activity   Assist Walk 150 feet activity did not occur: Safety/medical concerns         Walk 10 feet on uneven surface  activity   Assist Walk 10 feet on uneven surfaces activity did not occur: Safety/medical concerns         Wheelchair     Assist Will patient use wheelchair at discharge?: No             Wheelchair 50 feet with 2 turns activity    Assist            Wheelchair 150 feet activity     Assist          Blood pressure (!) 116/56, pulse 67, temperature 97.6 F (36.4 C), resp. rate 20, weight 85 kg, SpO2 98 %.  Medical Problem List and Plan: 1.  T11/12 compression fx s/p posterior fusion secondary to fall with debility due to asp PNA/trach/generalzied weakness             -patient may not shower             -  ELOS/Goals: 2-3 weeks- min A  -Continue CIR therapies including PT, OT, and SLP   2.  Antithrombotics: -DVT/anticoagulation:  Pharmaceutical: Other (comment)--Apixaban bid.              -antiplatelet therapy: N/A 3. Pain Management:  hydrocodone prn for severe pain  - Tramadol prn for moderate pain.  5/17 - fair to reasonable pain control   4. Mood/anxiety/sleep: LCSW to follow for evaluation and support.              -antipsychotic agents: N/A  -xanax bid prn anxiety  -continue trazodone for sleep 5. Neuropsych: This patient is not fully capable of making decisions on his own behalf. 6. Skin/Wound Care: Pressure relief measures.  7. Fluids/Electrolytes/Nutrition: NPO with tube feeds for nutritional support.             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               --?inflammatory subglottic mucosal, negative for malignancy             --chest PT, sx, OOB  -vest to help with secretions  5/19 stable   -downsize week of 5/23 per PCCM to coincide with f/u bronch     9. Acute on chronic respiratory failure: Encourage pulmonary hygiene              --continue Pulmicort nebs bid.  10.  PAF:  Monitor HR tid--continue Atenolol bid with Cardizem tid.   -HR controlled 5/9 10. Hyponatremia: Has resolved  11. Acute blood loss anemia, anemia chronic disease: Monitor with serial checks.   -hgb 8.4 5/6 12. Leukocytosis: WBC continues to fluctuate.   -no new signs of infection  -5/16 14.7  Repeat CBC tomorrow 13. Crohn's disease:    - once able to swallow pills, can try adding home Crohn's meds to help.    5/19 stools type 4 yesterday   -imodium decreased. Continue questran qid   -dc'ed pentasa as nursing can't get it down cortrak tube 14.  Bilateral pleural effusion: Pulmonary hygiene w/ flutter valve.              --continue to monitor for symptoms with increase in activity.   15. Aspiration PNA: zosyn completed 5/5 16. Dysphagia: Continue NPO with Glucerna               -MBS 5/12 per SLP demonstrated no improvement in peri-glottic mass/area nor improvement in swallow  -5/19 IR unable to place PEG d/t mesh in anterior abdomen over stomach   -general surgery to discuss with pt/family today. Pt concerned about anesthesia  17. T2DM: Was on Humalog tid prior to admission.             --will monitor BS every 4 hours as on tube feeds and use SSI prn    CBG (last 3)  Recent Labs    10/25/20 2352 10/26/20 0353 10/26/20 0807  GLUCAP 163* 174* 149*  5/19: fair control. Consider adding lantus  18. Urine retention: appreciate urology placement of foley catheter  -continue for this week at least.       LOS: 14 days A FACE TO [place] 10/26/2020, 9:09 AM

## 2020-10-26 NOTE — Consult Note (Addendum)
KAO CONRY [birth year]  [id number].    Requesting MD: Dr. Naaman Plummer Chief Complaint/Reason for Consult: Dysphagia/Gastrostomy tube placement.   HPI: Devin [name] is a 80 y.o. male w/ a hx of adenocarcinoma of the lung s/p LUL lobectomy (2016), hx of prostate cancer s/p thyroidectomy (1999), hx of thyroid cancer s/p thyroidectomy (2012), hypothyroidism, DM2, HTN, Paroxysmal A. 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 Earlean Shawl, ankylosing spondylitis who was admitted after a fall on 3/31.  Patient was found to have a T11-T12 Chance fracture.  He underwent surgical fixation by Dr. Ronnald Ramp on 4/8.  During hospitalization patient had prolonged intubation and underwent trach on 4/12.  He was also found to have Klebsiella pneumonia that was treated with IV antibiotics.  After weaning to trach collar patient was found to have dysphagia.  He underwent swallow study on 4/22 he was found to have thickening of the prevertebral tissue at level of C4-5.  He underwent CT on 4/23 that showed prevertebral soft tissue mass to the level of the supraglottic larynx.  Biopsies were taken on 4/27 by Dr. Fredric Dine that were negative for malignancy. Patient has continued to work with speech since that time but remains n.p.o. with tube feeds via cortrak for nutrition.  He is currently in rehab.  IR initially asked to see for PEG placement but given patient's history of ventral hernia repair with mesh there is now felt to be a safe window. We were asked to see for possible PEG vs Gastrostomy tube placement in OR. Patient currently on complains of back pain. No other complaints.   Prior abdominal surgeries - Dr. Lucia Gaskins 04/01/2007 - Resection of cecum, appendix, terminal ileum (approximately 40 cm), and Meckel's diverticula, stricturoplasty x1 - Dr. Lucia Gaskins - 02/13/2008 - Laparoscopic ventral  hernia repair with a 23 x 20 cm Parietex mesh, and nodular changes of the liver. - Radical retropubic prostatectomy and bilateral pelvic lymph node dissection. - Dr. Dalbert Batman - 05/07/2016 - Laparoscopic LOA x 80mn, Cholecystectomy   ROS: Review of Systems  Constitutional: Negative for chills and fever.  Respiratory: Negative for cough and shortness of breath.   Cardiovascular: Negative for chest pain and leg swelling.  Gastrointestinal: Negative for abdominal pain, diarrhea (previously, resolved now), nausea and vomiting.  Genitourinary: Negative for dysuria.       Foley in place  Musculoskeletal: Positive for back pain.  Psychiatric/Behavioral: Negative for substance abuse.  All other systems reviewed and are negative.   Family History  Problem Relation Age of Onset  . CAD Mother   . Cancer Neg Hx        No cancer in the patient's immediate family, except of course for the patient himself, as noted    Past Medical History:  Diagnosis Date  . A-fib ([facility]   . Adenocarcinoma of left lung, stage 1 ([facility] 03/10/2015  . Ankylosing spondylitis ([facility]    Diagnosed during lumbar fracture summer of 2016    . Crohn's disease ([facility]   . Gout   . HIstory of basal cell cancer of face    THYROID CA HX  . History of kidney stones   . Hypertension   . Hypothyroidism   . Impotence   . 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 Obesity (BMI 30-39.9)   . Osteoporosis    Pt completed 5 years of fosamax in  2013     . Prostate cancer with recurrence    Treated with prostatectomy with recurrence 2012 with Lupron treatment now him   . Psoriasis   . Thyroid cancer ([place]) 1999   Treated with RAI and total thyroidectomy   . Type II diabetes mellitus ([place])     Past Surgical History:  Procedure Laterality Date  . ABDOMINAL EXPLORATION SURGERY     for small bowel obstruction  . APPENDECTOMY  03/2007  . BACK SURGERY    . BASAL CELL CARCINOMA EXCISION  "several"   "head"  .  CARDIAC CATHETERIZATION  03/17/2003  . CHOLECYSTECTOMY N/A 05/07/2016   Procedure: LAPAROSCOPIC CHOLECYSTECTOMY;  Surgeon: Fanny Skates, MD;  Location: [facility];  Service: General;  Laterality: N/A;  . COLON SURGERY  03/2007   Resection of cecum, appendix, terminal ileum (approximately/notes 10/10/2010  . CYSTOSCOPY/URETEROSCOPY/HOLMIUM LASER/STENT PLACEMENT Right 07/03/2020   Procedure: CYSTOSCOPY/RETROGRADE/URETEROSCOPY REMOVAL OF BLADDER STONE;  Surgeon: Raynelle Bring, MD;  Location: [facility];  Service: Urology;  Laterality: Right;  . DIRECT LARYNGOSCOPY N/A 10/04/2020   Procedure: DIRECT LARYNGOSCOPY, PHARYNGOSCOPY WITH BIOPSY;  Surgeon: [name], DO;  Location: [facility];  Service: ENT;  Laterality: N/A;  . ESOPHAGOSCOPY N/A 10/04/2020   Procedure: ESOPHAGOSCOPY WITH BIOPSY;  Surgeon: [name], DO;  Location: [facility];  Service: ENT;  Laterality: N/A;  . HERNIA REPAIR    . LAMINECTOMY WITH POSTERIOR LATERAL ARTHRODESIS LEVEL 1 N/A 09/15/2020   Procedure: REVISION OF THORACOLUMBAR FUSION, ADDITION OF CROSS-CONNECTORS;  Surgeon: Eustace Moore, MD;  Location: [facility];  Service: Neurosurgery;  Laterality: N/A;  . LAPAROSCOPIC CHOLECYSTECTOMY  05/07/2016  . LAPAROSCOPIC LYSIS OF ADHESIONS  05/07/2016  . LAPAROSCOPIC LYSIS OF ADHESIONS N/A 05/07/2016   Procedure: LAPAROSCOPIC LYSIS OF ADHESIONS TIMES ONE HOUR;  Surgeon: Fanny Skates, MD;  Location: [facility];  Service: General;  Laterality: N/A;  . POSTERIOR FUSION THORACIC SPINE  02/08/2016   1. Posterior thoracic arthrodesis T7-T11 utilizing morcellized allograft, 2. Posterior thoracic segmental fixation T7-T11 utilizing nuvasive pedicle screws  . PROSTATECTOMY  06/2001   w/bilateral pelvic lymph nose dissection/notes 10/24/2010  . SPINAL FUSION  12/2014   Open reduction internal fixation of L1 Chance fracture with posterior fusion T10-L4 utilizing morcellized allograft and some local autograft, segmental instrumentation T10-L4 inclusive  utilizing nuvasive pedicle screws/notes 12/16/2014  . Stress Cardiolite  02/17/2003  . THOROCOTOMY WITH LOBECTOMY  03/16/2015   Procedure: THOROCOTOMY WITH LOBECTOMY;  Surgeon: Ivin Poot, MD;  Location: [facility];  Service: Thoracic;;  . TONSILLECTOMY    . TOTAL THYROIDECTOMY  1997  . Venous Doppler  05/30/2004  . VENTRAL HERNIA REPAIR  04/14/2008  . VIDEO ASSISTED THORACOSCOPY Left 03/16/2015   Procedure: VIDEO ASSISTED THORACOSCOPY;  Surgeon: Ivin Poot, MD;  Location: [employer] [facility];  Service: Thoracic;  Laterality: Left;    Social History:  reports that he quit smoking about 30 years ago. His smoking use included cigarettes. He has a 60.00 pack-year smoking history. He has never used smokeless tobacco. He reports previous alcohol use. He reports that he does not use drugs. Retired Development worker, international aid Married   Allergies: No Known Allergies  Medications Prior to Admission  Medication Sig Dispense Refill  . acetaminophen (TYLENOL) 160 MG/5ML solution Place 15.6 mLs (500 mg total) into feeding tube every 6 (six) hours. 120 mL 0  . acetylcysteine (MUCOMYST) 20 % nebulizer solution Take 3 mLs (600 mg total) by nebulization 2 (two) times daily. 30 mL 12  . albuterol (VENTOLIN  HFA) 108 (90 Base) MCG/ACT inhaler Inhale 2 puffs into the lungs every 6 (six) hours as needed for wheezing or shortness of breath. 8 g 6  . ALPRAZolam (XANAX) 0.5 MG tablet Take 0.5 mg by mouth 2 (two) times daily as needed for anxiety.    Marland Kitchen apixaban (ELIQUIS) 5 MG TABS tablet Take 5 mg by mouth 2 (two) times daily.    Marland Kitchen atenolol (TENORMIN) 25 MG tablet Take 1 tablet (25 mg total) by mouth 2 (two) times daily.    . budesonide (PULMICORT) 0.5 MG/2ML nebulizer solution Take 2 mLs (0.5 mg total) by nebulization 2 (two) times daily.  12  . cholestyramine (QUESTRAN) 4 g packet Place 1 packet (4 g total) into feeding tube 2 (two) times daily before lunch and supper. 60 each 12  . diltiazem (CARDIZEM) 10 mg/ml oral suspension Place 3 mLs (30  mg total) into feeding tube every 6 (six) hours.    . diphenhydrAMINE (BENADRYL) 25 mg capsule 1 capsule (25 mg total) by Per NG tube route at bedtime as needed for sleep. 30 capsule 0  . fiber (NUTRISOURCE FIBER) PACK packet Place 1 packet into feeding tube 2 (two) times daily.    . insulin aspart (NOVOLOG) 100 UNIT/ML injection Inject 0-20 Units into the skin every 4 (four) hours. 10 mL 11  . insulin glargine (LANTUS) 100 UNIT/ML injection Inject 0.12 mLs (12 Units total) into the skin 2 (two) times daily. 10 mL 11  . lactobacillus (FLORANEX/LACTINEX) PACK Place 1 packet (1 g total) into feeding tube 3 (three) times daily with meals.    Marland Kitchen levothyroxine (SYNTHROID) 125 MCG tablet Take 1 tablet (125 mcg total) by mouth daily before breakfast. 30 tablet 0  . loperamide HCl (IMODIUM) 1 MG/7.5ML suspension Place 15 mLs (2 mg total) into feeding tube 2 (two) times daily as needed for diarrhea or loose stools. 120 mL 0  . loperamide HCl (IMODIUM) 1 MG/7.5ML suspension Place 30 mLs (4 mg total) into feeding tube 4 (four) times daily. 120 mL 0  . nutrition supplement, JUVEN, (JUVEN) PACK Place 1 packet into feeding tube 2 (two) times daily between meals.  0  . Nutritional Supplements (FEEDING SUPPLEMENT, PROSOURCE TF,) liquid Place 45 mLs into feeding tube daily.    Marland Kitchen Nystatin (GERHARDT'S BUTT CREAM) CREA Apply 1 application topically 2 (two) times daily.    . ondansetron (ZOFRAN) 4 MG/2ML SOLN injection Inject 2 mLs (4 mg total) into the vein every 6 (six) hours as needed for nausea or vomiting. 2 mL 0  . oxyCODONE (ROXICODONE) 5 MG/5ML solution Place 5 mLs (5 mg total) into feeding tube every 4 (four) hours as needed for moderate pain.  0  . phenol (CHLORASEPTIC) 1.4 % LIQD Use as directed 1 spray in the mouth or throat as needed for throat irritation / pain.  0  . piperacillin-tazobactam (ZOSYN) 3.375 GM/50ML IVPB Inject 50 mLs (3.375 g total) into the vein every 8 (eight) hours. 50 mL   . tamsulosin  (FLOMAX) 0.4 MG CAPS capsule Take 1 capsule (0.4 mg total) by mouth daily. 30 capsule   . Water For Irrigation, Sterile (FREE WATER) SOLN Place 100 mLs into feeding tube every 8 (eight) hours.       Physical Exam: Blood pressure (!) 116/56, pulse 67, temperature 97.6 F (36.4 C), resp. rate 20, weight 85 kg, SpO2 98 %. General: pleasant, WD/WN white elderly male who is laying in bed in NAD HEENT: head is normocephalic, atraumatic.  Sclera  are noninjected.  PERRL.  Ears and nose without any masses or lesions.  Mouth is pink and moist. Dentition fair. Trach in place, c/d/i.  Heart: regular, rate, and rhythm.  Normal s1,s2. No obvious murmurs, gallops, or rubs noted.  Palpable pedal pulses bilaterally  Lungs: CTAB, no wheezes, rhonchi, or rales noted.  Respiratory effort nonlabored Abd: Soft, NT/ND, +BS, no masses, hernias, or organomegaly. Prior abdominal surgical scars are well healed MS: no BUE/BLE edema, calves soft and nontender. Moves all extremities.  Skin: warm and dry with no masses, lesions, or rashes Psych: A&Ox4 with an appropriate affect Neuro: cranial nerves grossly intact, moves all extremities, follows commands, able speech, though process intact, gait not assessed, sensation intact to light touch for all extremities   Results for orders placed or performed during the hospital encounter of 10/12/20 (from the past 48 hour(s))  Glucose, capillary     Status: Abnormal   Collection Time: 10/24/20 12:09 PM  Result Value Ref Range   Glucose-Capillary 166 (H) 70 - 99 mg/dL    Comment: Glucose reference range applies only to samples taken after fasting for at least 8 hours.  Glucose, capillary     Status: Abnormal   Collection Time: 10/24/20  4:07 PM  Result Value Ref Range   Glucose-Capillary 153 (H) 70 - 99 mg/dL    Comment: Glucose reference range applies only to samples taken after fasting for at least 8 hours.  Glucose, capillary     Status: Abnormal   Collection Time:  10/24/20  9:08 PM  Result Value Ref Range   Glucose-Capillary 153 (H) 70 - 99 mg/dL    Comment: Glucose reference range applies only to samples taken after fasting for at least 8 hours.  Glucose, capillary     Status: Abnormal   Collection Time: 10/25/20 12:01 AM  Result Value Ref Range   Glucose-Capillary 155 (H) 70 - 99 mg/dL    Comment: Glucose reference range applies only to samples taken after fasting for at least 8 hours.  Glucose, capillary     Status: Abnormal   Collection Time: 10/25/20  4:25 AM  Result Value Ref Range   Glucose-Capillary 178 (H) 70 - 99 mg/dL    Comment: Glucose reference range applies only to samples taken after fasting for at least 8 hours.  CBC     Status: Abnormal   Collection Time: 10/25/20  5:24 AM  Result Value Ref Range   WBC 9.4 4.0 - 10.5 K/uL   RBC 2.92 (L) 4.22 - 5.81 MIL/uL   Hemoglobin 8.8 (L) 13.0 - 17.0 g/dL   HCT 29.4 (L) 39.0 - 52.0 %   MCV 100.7 (H) 80.0 - 100.0 fL   MCH 30.1 26.0 - 34.0 pg   MCHC 29.9 (L) 30.0 - 36.0 g/dL   RDW 16.1 (H) 11.5 - 15.5 %   Platelets 227 150 - 400 K/uL   nRBC 0.0 0.0 - 0.2 %    Comment: Performed at [facility] [address], [place], [place] [address]  Basic metabolic panel     Status: Abnormal   Collection Time: 10/25/20  5:24 AM  Result Value Ref Range   Sodium 130 (L) 135 - 145 mmol/L   Potassium 5.1 3.5 - 5.1 mmol/L   Chloride 92 (L) 98 - 111 mmol/L   CO2 34 (H) 22 - 32 mmol/L   Glucose, Bld 169 (H) 70 - 99 mg/dL    Comment: Glucose reference range applies only to samples  taken after fasting for at least 8 hours.   BUN 41 (H) 8 - 23 mg/dL   Creatinine, Ser 1.00 0.61 - 1.24 mg/dL   Calcium 8.9 8.9 - 10.3 mg/dL   GFR, Estimated >60 >60 mL/min    Comment: (NOTE) Calculated using the CKD-EPI Creatinine Equation (2021)    Anion gap 4 (L) 5 - 15    Comment: Performed at [facility] [address], [place], [place] [address]  Glucose, capillary     Status: Abnormal   Collection  Time: 10/25/20  8:04 AM  Result Value Ref Range   Glucose-Capillary 166 (H) 70 - 99 mg/dL    Comment: Glucose reference range applies only to samples taken after fasting for at least 8 hours.  Glucose, capillary     Status: Abnormal   Collection Time: 10/25/20 12:05 PM  Result Value Ref Range   Glucose-Capillary 148 (H) 70 - 99 mg/dL    Comment: Glucose reference range applies only to samples taken after fasting for at least 8 hours.  Glucose, capillary     Status: Abnormal   Collection Time: 10/25/20  4:17 PM  Result Value Ref Range   Glucose-Capillary 123 (H) 70 - 99 mg/dL    Comment: Glucose reference range applies only to samples taken after fasting for at least 8 hours.  Glucose, capillary     Status: Abnormal   Collection Time: 10/25/20  7:58 PM  Result Value Ref Range   Glucose-Capillary 168 (H) 70 - 99 mg/dL    Comment: Glucose reference range applies only to samples taken after fasting for at least 8 hours.  Glucose, capillary     Status: Abnormal   Collection Time: 10/25/20 11:52 PM  Result Value Ref Range   Glucose-Capillary 163 (H) 70 - 99 mg/dL    Comment: Glucose reference range applies only to samples taken after fasting for at least 8 hours.  Glucose, capillary     Status: Abnormal   Collection Time: 10/26/20  3:53 AM  Result Value Ref Range   Glucose-Capillary 174 (H) 70 - 99 mg/dL    Comment: Glucose reference range applies only to samples taken after fasting for at least 8 hours.  Glucose, capillary     Status: Abnormal   Collection Time: 10/26/20  8:07 AM  Result Value Ref Range   Glucose-Capillary 149 (H) 70 - 99 mg/dL    Comment: Glucose reference range applies only to samples taken after fasting for at least 8 hours.   CT ABDOMEN WO CONTRAST  Result Date: 10/25/2020 CLINICAL DATA:  Assess anatomy for G-tube placement EXAM: CT ABDOMEN WITHOUT CONTRAST TECHNIQUE: Multidetector CT imaging of the abdomen was performed following the standard protocol without IV  contrast. COMPARISON:  09/10/2020 FINDINGS: Lower chest: Small bilateral pleural effusions. Linear opacities at the lung bases likely due to atelectasis. There is airspace opacity in the right lower lobe suspicious for pneumonia. Hepatobiliary: Calcification located in the subcapsular region of the posterior right hepatic lobe may be due to prior granulomatous inflammation. The liver otherwise unremarkable. The gallbladder is absent. Pancreas: Unremarkable. No pancreatic ductal dilatation or surrounding inflammatory changes. Spleen: Normal in size without focal abnormality. Adrenals/Urinary Tract: Single punctate bilateral nonobstructing renal calculi. Kidneys and visualized segments of the ureters otherwise unremarkable. No significant abnormality of the adrenal glands. Stomach/Bowel: NG tube terminates in the distal gastric lumen. 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 No dilated bowel loops noted within the visualized abdomen. Vascular/Lymphatic: No enlarged abdominal lymph nodes. Atherosclerotic  plaque seen scattered throughout the abdominal aorta. Other: Anterior abdominal wall surgical mesh material is noted. Areas of subcutaneous fat stranding in the anterior abdominal wall subcutaneous fat likely sites of medication administration. Musculoskeletal: Diffuse osteopenia extensive posterior fusion changes of the thoracolumbar spine. Fracture noted through the inferior endplate of C62 vertebral body. IMPRESSION: 1. Surgical mesh material interposed between the stomach and anterior abdominal wall. 2. Right lower lobe airspace opacities suspicious for pneumonia. Electronically Signed   By: Miachel Roux M.D.   On: 10/25/2020 08:28   DG Abd Portable 1V  Result Date: 10/25/2020 CLINICAL DATA:  Feeding tube placement EXAM: PORTABLE ABDOMEN - 1 VIEW COMPARISON:  10/15/2020 FINDINGS: Extensive spinal hardware redemonstrated. Esophageal tube tip overlies the gastroduodenal junction. Evidence of prior hernia repair.  Visible gas pattern is unobstructed. IMPRESSION: Feeding tube tip overlies the gastroduodenal region. Electronically Signed   By: Donavan Foil M.D.   On: 10/25/2020 16:46      Assessment/Plan Hx of adenocarcinoma of the lung s/p LUL lobectomy (2016) Hx of prostate cancer s/p thyroidectomy (1999) Hx of thyroid cancer s/p thyroidectomy (2012) Hypothyroidism DM2 HTN Paroxysmal A. Fib on Eliquis - last dose this am, 5/19 COPD Restrictive lung disease 2/2 ankylosing spondylitis [former smoker] Hx of Crohn's disease on mesalamine as outpatient followed by Dr. Earlean Shawl  Ankylosing spondylitis Fall on 3/31 T11-T12 Chance fracture s/p surgical fixation by Dr. Ronnald Ramp on 4/8 S/p trach - 4/12. Trach exchange 5/3 Prevertebral soft tissue mass at the of the level of supraglottic larynx - s/p biopsy by Dr. Fredric Dine - 4/27. Path negative for malignancy.   Prior abdominal surgeries - Dr. Lucia Gaskins 04/01/2007 - Resection of cecum, appendix, terminal ileum (approximately 40 cm), and Meckel's diverticula, stricturoplasty x1 - Dr. Lucia Gaskins - 02/13/2008 - Laparoscopic ventral hernia repair with a 23 x 20 cm Parietex mesh, and nodular changes of the liver. - Radical retropubic prostatectomy and bilateral pelvic lymph node dissection. - Dr. Dalbert Batman - 05/07/2016 - Laparoscopic LOA x 37mn, Cholecystectomy   Dysphagia  - This is a 80y.o. male who has been admitted after a T11-12 fx following a fall. His hospital course was complicated by prolonged intubation leading to a trach on 4/12.  He is found to have dysphagia after extubation.  During swallow study on 4/22 he was found to have thickening of the prevertebral tissue at level of C4-5.  He underwent CT on 4/23 that showed prevertebral soft tissue mass to the level of the supraglottic larynx.  Biopsies were taken on 4/27 are negative for malignancy.  Patient has continued to work with speech since that time but remains n.p.o. with tube feeds via cortrak for nutrition.   He is currently in rehab.  IR initially asked to see for PEG placement but given patient's history of ventral hernia repair with mesh there is now felt to be a safe window.  I reviewed the case and CT with one of our trauma surgeons who does PEG tube placements.  They also felt there is not a safe window for PEG tube.  Would recommend open gastrostomy tube placement.  I discussed with patient and family indications and risks of the procedure.  Risks include but are not limited to general anesthesia (MI, CVA, death, prolonged ventilation), pain, infection, bleeding, injury to surrounding structures, potential problems w/ g-tube (leaking, clogging etc), DVT/PE. We discussed that this will need to stay in place for at least 6-8 weeks. We discussed that he may need additional rehab after surgery. Patient is  currently on Eliquis. Would recommend holding this and transitioning to IV heparin. Will discuss with MD about timing of surgery (plan for next week). In the meantime would recommend cardiac evaluation for clearance. Will reach out to rehab team to let them know the plan.   Jillyn Ledger, [facility] Surgery 10/26/2020, 11:10 AM Please see Amion for pager number during day hours 7:00am-4:30pm

## 2020-10-26 NOTE — Progress Notes (Signed)
Occupational Therapy Session Note  Patient Details  Name: Devin [name] MRN: [id number] Date of Birth: 1940/12/07  Today's Date: 10/26/2020 OT Individual Time: 1100-1135 OT Individual Time Calculation (min): 35 min   Short Term Goals: Week 2:  OT Short Term Goal 1 (Week 2): Pt will complete bathing with mod assist at sit > stand level OT Short Term Goal 2 (Week 2): Pt will don shirt with min assist OT Short Term Goal 3 (Week 2): Pt will complete toilet transfer with min A and LRAD  Skilled Therapeutic Interventions/Progress Updates:   Met pt sitting in wc with brace on. Pt expressed lower back pain and requested medication from nurse. Pt was given medication prior to beginning of treatment. OT offered to assist him with toileting or clothing management tasks but pt declined due to pain. Pt cooperated for treatment session regardless of his pain primarily from encouragement from his wife. While sitting EOB, pt was able to complete 10 reps of leg exercises including: knee flexion and extension exercise and leg raises to touch OT's hand hovering over his knees. Pt was Min A for sit > stand transfer and stand > sit transfers. Pt was able to tolerate sitting EOB and taking off brace. Pt was left in supine in bed, alarms on and call bell in reach.   Therapy Documentation Precautions:  Precautions Precautions: Fall,Back Precaution Booklet Issued: No Precaution Comments: rectal tube, trach collar, cortrak, TLSO Required Braces or Orthoses: Spinal Brace Spinal Brace: Thoracolumbosacral orthotic,Applied in sitting position Restrictions Weight Bearing Restrictions: No Pain: Pain Assessment Pain Scale: 0-10 Pain Score: 6  Pain Type: Acute pain;Surgical pain Pain Location: Back Pain Orientation: Lower Pain Descriptors / Indicators: Aching Pain Frequency: Intermittent Pain Onset: With Activity Patients Stated Pain Goal: 2 Pain Intervention(s): Medication (See eMAR)   Therapy/Group:  Individual Therapy  [name] 10/26/2020, 11:46 AM

## 2020-10-26 NOTE — Progress Notes (Signed)
Pt's trach was capped per Dr. Carlis Abbott CCM. Plan is to trial capping trach today, give the pt a break overnight, and resume capping over the weekend including overnight starting in the morning. Pt is tolerating very well with no respiratory distress. Pt is able to cough out secretions via the mouth Yankauer suction. Pt is currently on RA saturating 92%. Rt will closely monitor pt.

## 2020-10-26 NOTE — Progress Notes (Signed)
Physical Therapy Session Note  Patient Details  Name: Devin [name] MRN: [id number] Date of Birth: [birth year]  Today's Date: 10/26/2020 PT Individual Time: 8295-6213 and 0865-7846 PT Individual Time Calculation (min): 45 min and 30 min  Short Term Goals: Week 2:  PT Short Term Goal 1 (Week 2): Pt will perform bed mobility consistently with CGA. PT Short Term Goal 2 (Week 2): Pt will perform bed to chair consistently with CGA. PT Short Term Goal 3 (Week 2): Pt will ambulate 12' with minA and LRAD.  Skilled Therapeutic Interventions/Progress Updates:     1st Session: Upon PT arrival RN providing morning meds for patient. Pt misses 15 minutes of skilled PT due to RN care. Upon PT return pt supine and agreeable to therapy. No complaint of pain. Pt performs supine to sit with modA and cues for logrolling technique. While seated EOB, pt's daughter provides totalA to don TLSO. Pt performs sit to stand and stand step transfer to [facility] with minA. WC transport with family outside for time management and energy conservation. Pt performs sit to stand with RW and CGA, then ambulates x90' outdoors over concrete graded surfaces. Pt verbalizes feeling very fatigued following ambulation, but does not complain of SOB symptoms. Pt remains with family outside at end of session. RN and PA Pam aware.   2nd Session: Pt received supine in bed. Trach capped and on room air. O2 sats in low to mid 90s. Pt is agreeable to therapy. Supine to sit with minA and cues on logrolling technique. PT provides totalA to don TLSO at EOB. Pt performs sit to stand with CGA and holding onto back of chair for support. In standing, pt verbalizes need to have bowel movement. Following seated rest break, pt performs stand step transfer to [facility] with minA HHA. Pt is unsuccessful with BM. Sit to stand with CGA and pt able to pull pants up in standing. Stand step back to bed with minA and cues on positioning. Sit to supine with minA. Left supine  with all needs within reach. O2 does not appears to drop below 90% during session.  Therapy Documentation Precautions:  Precautions Precautions: Fall,Back Precaution Booklet Issued: No Precaution Comments: rectal tube, trach collar, cortrak, TLSO Required Braces or Orthoses: Spinal Brace Spinal Brace: Thoracolumbosacral orthotic,Applied in sitting position Restrictions Weight Bearing Restrictions: No  Therapy/Group: Individual Therapy  Breck Coons, PT, DPT 10/26/2020, 3:44 PM

## 2020-10-27 LAB — CBC
HCT: 30.5 % — ABNORMAL LOW (ref 39.0–52.0)
Hemoglobin: 9.5 g/dL — ABNORMAL LOW (ref 13.0–17.0)
MCH: 30.4 pg (ref 26.0–34.0)
MCHC: 31.1 g/dL (ref 30.0–36.0)
MCV: 97.8 fL (ref 80.0–100.0)
Platelets: 236 10*3/uL (ref 150–400)
RBC: 3.12 MIL/uL — ABNORMAL LOW (ref 4.22–5.81)
RDW: 15.7 % — ABNORMAL HIGH (ref 11.5–15.5)
WBC: 10.1 10*3/uL (ref 4.0–10.5)
nRBC: 0 % (ref 0.0–0.2)

## 2020-10-27 LAB — HEPARIN LEVEL (UNFRACTIONATED): Heparin Unfractionated: >1.1 IU/mL — ABNORMAL HIGH (ref 0.30–0.70)

## 2020-10-27 LAB — APTT
aPTT: 75 seconds — ABNORMAL HIGH (ref 24–36)
aPTT: 67 seconds — ABNORMAL HIGH (ref 24–36)

## 2020-10-27 LAB — GLUCOSE, CAPILLARY
Glucose-Capillary: 162 mg/dL — ABNORMAL HIGH (ref 70–99)
Glucose-Capillary: 185 mg/dL — ABNORMAL HIGH (ref 70–99)
Glucose-Capillary: 184 mg/dL — ABNORMAL HIGH (ref 70–99)
Glucose-Capillary: 154 mg/dL — ABNORMAL HIGH (ref 70–99)
Glucose-Capillary: 165 mg/dL — ABNORMAL HIGH (ref 70–99)

## 2020-10-27 NOTE — Progress Notes (Signed)
Physical Therapy Weekly Progress Note  Patient Details  Name: Devin [name] MRN: [id number] Date of Birth: May 06, 1941  Beginning of progress report period: Oct 20, 2020 End of progress report period: Oct 27, 2020  Today's Date: 10/27/2020 PT Individual Time: 0803-0900 PT Individual Time Calculation (min): 57 min   Patient has met 2 of 3 short term goals.  Pt is progressing very well toward mobility goals, improving independence with bed mobility, functional transfers, and ambulation. Pt is consistently performing bed mobility at CGA level, with use of bed features. Pt transfers frequently with CGA but occasionally requires minA when fatigued or when optimal sequencing and body mechanics not utilized. Pt ambulates up to 110' with minA, and says that primary limiting factor is fatigue in legs. Pt has improving supplemental oxygen requirements and has performed in-room mobility on room air while maintaining O2 sats >90%. Focus of coming week to be increased time OOB, increasing endurance and ambulation distance, and initiating stair training.  Patient continues to demonstrate the following deficits muscle weakness, decreased cardiorespiratoy endurance and decreased oxygen support and decreased sitting balance, decreased standing balance, decreased postural control and decreased balance strategies and therefore will continue to benefit from skilled PT intervention to increase functional independence with mobility.  Patient progressing toward long term goals..  Continue plan of care.  PT Short Term Goals Week 2:  PT Short Term Goal 1 (Week 2): Pt will perform bed mobility consistently with CGA. PT Short Term Goal 1 - Progress (Week 2): Met PT Short Term Goal 2 (Week 2): Pt will perform bed to chair consistently with CGA. PT Short Term Goal 2 - Progress (Week 2): Progressing toward goal PT Short Term Goal 3 (Week 2): Pt will ambulate 44' with minA and LRAD. PT Short Term Goal 3 - Progress (Week  2): Met Week 3:  PT Short Term Goal 1 (Week 3): Pt will perform bed mobility with supervision. PT Short Term Goal 2 (Week 3): Pt will perform bed to chair consistently with CGA. PT Short Term Goal 3 (Week 3): Pt will ambulate 150' with CGA and LRAD. PT Short Term Goal 4 (Week 3): Pt will complete x5 steps with LHR and minA.  Skilled Therapeutic Interventions/Progress Updates:     Pt received supine in bed. Initially pt attempts to refuse therapy due to not getting any sleep night before, but with education on benefits of therapy and gentle encouragement, pt is agreeable to therapy. No complaint of pain. Pt performs supine to sit with cues for hand placement and increased time to complete. PT provides totalA to don TLSO at EOB. Pt performs sit to stand with CGA. Pt verbalizes need to have bowel movement upon standing. Pt ambulates x15' to toilet with CGA. Pt is unsuccessful in moving bowels. Sit to stand and transfer back to [facility] with CGA and cues on positioning and hand placement. WC transport to gym for time management. Pt performs stand pivot to Nustep with CGA. Pt completes Nustep on workload of 4 for total of 10:00. Pt requires x3-4 rest breaks during activity. PT cues to maintain steps per minute>30. O2 monitored during activity. On room air, pt at 91% following Nustep. Stand pivot to WC with minA. WC transport back to room. Pt left seated in WC with all needs within reach.  Therapy Documentation Precautions:  Precautions Precautions: Fall,Back Precaution Booklet Issued: No Precaution Comments: rectal tube, trach collar, cortrak, TLSO Required Braces or Orthoses: Spinal Brace Spinal Brace: Thoracolumbosacral orthotic,Applied in sitting position  Restrictions Weight Bearing Restrictions: No   Therapy/Group: Individual Therapy  Breck Coons, PT, DPT 10/27/2020, 8:12 PM

## 2020-10-27 NOTE — Progress Notes (Signed)
Speech Language Pathology Weekly Progress and Session Note  Patient Details  Name: Devin [name] MRN: [id number] Date of Birth: 03-Jan-1941  Beginning of progress report period: Oct 20, 2020 End of progress report period: Oct 27, 2020  Today's Date: 10/27/2020 SLP Individual Time: 9311-2162 SLP Individual Time Calculation (min): 25 min  Short Term Goals: Week 2: SLP Short Term Goal 1 (Week 2): Patient will initiate verbal responses vs use of gestures for communication with PMSV in place in 75% of opportunities with Mod verbal cues. SLP Short Term Goal 1 - Progress (Week 2): Met SLP Short Term Goal 2 (Week 2): Patient will utilize an increased vocal intensity at the phrase level to achieve ~90% intelligibility with Mod A verbal cues. SLP Short Term Goal 2 - Progress (Week 2): Met SLP Short Term Goal 3 (Week 2): Patient will recall swallowing and safety precautions for trials of ice chips wth Min A multimodal cues. SLP Short Term Goal 3 - Progress (Week 2): Met SLP Short Term Goal 4 (Week 2): Patient will utilize external memory aids to recall new, daily information with supervision level verbal cues. SLP Short Term Goal 4 - Progress (Week 2): Not met    New Short Term Goals: Week 3: SLP Short Term Goal 1 (Week 3): Patient will utilize an increased vocal intensity at the phrase level to achieve ~90% intelligibility with Min A verbal cues. SLP Short Term Goal 2 (Week 3): Patient will recall swallowing and safety precautions for trials of ice chips wth supervision multimodal cues. SLP Short Term Goal 3 (Week 3): Patient will utilize external memory aids to recall new, daily information with supervision level verbal cues.  Weekly Progress Updates: Patient has made functional gains and has met 3 of 4 STGs this reporting period. Currently, patient's trach is capped. Patient continues to require overall Mod A verbal cues for use of verbal expression instead of gestures and for use of an  increased vocal intensity to improve intelligibility. Patient continues to demonstrate consistent overt s/s of aspiration with trials of ice cips and  recommend patient remain NPO with need for long-term alternative means of nutrition. [place] also demonstrates decreased recall of new information requiring overall Mod A verbal cues for carryover of information. Patient and family education ongoing. Patient would benefit from continued skilled SLP intervention to maximize his speech, swallow and cognitive functioning prior to discharge.      Intensity: Minumum of 1-2 x/day, 30 to 90 minutes Frequency: 3 to 5 out of 7 days Duration/Length of Stay: 11/04/20 Treatment/Interventions: Dysphagia/aspiration precaution training;Speech/Language facilitation;Cueing hierarchy;Environmental controls;Therapeutic Activities;Patient/family education;Functional tasks   Daily Session  Skilled Therapeutic Interventions:  Skilled treatment session focused on speech and swallowing goals. Upon arrival, patient's trach was capped. SLP facilitated session by providing Mod A verbal cues to maximize vocal intensity to increase intelligibility to 90% at the phrase level. Patient performed oral care via the suction toothbrush and consumed trials of ice chips with consistent throat clearing and oral expectoration of suspected pharyngeal resiude. Recommend patient remain NPO. Patient's wife present throughout session. Patient left upright in bed with alarm on and all needs within reach. Continue with current plan of care.      Pain No/Denies Pain   Therapy/Group: Individual Therapy  Avalon Coppinger 10/27/2020, 3:04 PM

## 2020-10-27 NOTE — Progress Notes (Signed)
PROGRESS NOTE   Subjective/Complaints: Didn't sleep as well last night. Tolerated capping yesterday.   ROS: Patient denies fever, rash, sore throat, blurred vision, nausea, vomiting, diarrhea,       headache, or mood change.   Objective:   DG Abd Portable 1V  Result Date: 10/25/2020 CLINICAL DATA:  Feeding tube placement EXAM: PORTABLE ABDOMEN - 1 VIEW COMPARISON:  10/15/2020 FINDINGS: Extensive spinal hardware redemonstrated. Esophageal tube tip overlies the gastroduodenal junction. Evidence of prior hernia repair. Visible gas pattern is unobstructed. IMPRESSION: Feeding tube tip overlies the gastroduodenal region. Electronically Signed   By: Donavan Foil M.D.   On: 10/25/2020 16:46   Recent Labs    10/25/20 0524 10/27/20 0628  WBC 9.4 10.1  HGB 8.8* 9.5*  HCT 29.4* 30.5*  PLT 227 236   Recent Labs    10/25/20 0524  NA 130*  K 5.1  CL 92*  CO2 34*  GLUCOSE 169*  BUN 41*  CREATININE 1.00  CALCIUM 8.9    Intake/Output Summary (Last 24 hours) at 10/27/2020 1111 Last data filed at 10/27/2020 0900 Gross per 24 hour  Intake 583.28 ml  Output 3650 ml  Net -3066.72 ml        Physical Exam: Vital Signs Blood pressure (!) 104/52, pulse 70, temperature 99.5 F (37.5 C), temperature source Oral, resp. rate 17, weight 85 kg, SpO2 95 %.     Constitutional: No distress . Vital signs reviewed. HEENT: EOMI, oral membranes moist Neck: supple Cardiovascular: RRR without murmur. No JVD    Respiratory/Chest: CTA Bilaterally without wheezes or rales. Normal effort    GI/Abdomen: BS +, non-tender, non-distended Ext: no clubbing, cyanosis, or edema Psych: pleasant and cooperative Uro: foley in place, with clear,yellow urine Skin:  Scattered bruises on UE's. Back incision CDI, abrasion along old incision, foam  Neuro: awake, alert and appropriate.  No focal CN abnl except decreased cough. Reflexes are 1+ in all 4's.. UE  grossly 3/5 prox to distal. LE: 2/5 HF, KE and 3/5 ADF/APF grossly unchanged. Senses LT/P in both LE's Musculoskeletal: LB TTP   Assessment/Plan: 1. Functional deficits which require 3+ hours per day of interdisciplinary therapy in a comprehensive inpatient rehab setting.  Physiatrist is providing close team supervision and 24 hour management of active medical problems listed below.  Physiatrist and rehab team continue to assess barriers to discharge/monitor patient progress toward functional and medical goals  Care Tool:  Bathing    Body parts bathed by patient: Right arm,Left arm,Chest,Abdomen,Face,Right upper leg,Left upper leg   Body parts bathed by helper: Front perineal area,Buttocks,Right lower leg,Left lower leg     Bathing assist Assist Level: Moderate Assistance - Patient 50 - 74%     Upper Body Dressing/Undressing Upper body dressing   What is the patient wearing?: Hospital gown only    Upper body assist Assist Level: Moderate Assistance - Patient 50 - 74%    Lower Body Dressing/Undressing Lower body dressing      What is the patient wearing?: Incontinence brief     Lower body assist Assist for lower body dressing: Total Assistance - Patient < 25%     Chartered loss adjuster  assist Assist for toileting: 2 Helpers     Transfers Chair/bed transfer  Transfers assist  Chair/bed transfer activity did not occur: Safety/medical concerns  Chair/bed transfer assist level: Minimal Assistance - Patient > 75% Chair/bed transfer assistive device: Programmer, multimedia   Ambulation assist      Assist level: Minimal Assistance - Patient > 75% Assistive device: Walker-rolling Max distance: 90'   Walk 10 feet activity   Assist  Walk 10 feet activity did not occur: Safety/medical concerns  Assist level: Minimal Assistance - Patient > 75% Assistive device: Walker-rolling   Walk 50 feet activity   Assist Walk 50 feet with 2 turns  activity did not occur: Safety/medical concerns  Assist level: Minimal Assistance - Patient > 75% Assistive device: Walker-rolling    Walk 150 feet activity   Assist Walk 150 feet activity did not occur: Safety/medical concerns         Walk 10 feet on uneven surface  activity   Assist Walk 10 feet on uneven surfaces activity did not occur: Safety/medical concerns         Wheelchair     Assist Will patient use wheelchair at discharge?: No             Wheelchair 50 feet with 2 turns activity    Assist            Wheelchair 150 feet activity     Assist          Blood pressure (!) 104/52, pulse 70, temperature 99.5 F (37.5 C), temperature source Oral, resp. rate 17, weight 85 kg, SpO2 95 %.  Medical Problem List and Plan: 1.  T11/12 compression fx s/p posterior fusion secondary to fall with debility due to asp PNA/trach/generalzied weakness             -patient may not shower             -ELOS/Goals: 11/04/20- min A  -Continue CIR therapies including PT, OT, and SLP    2.  Antithrombotics: -DVT/anticoagulation:  Pharmaceutical:  -Apixaban--> heparin gtt in anticipation of potential G-tube placement by surgery.              -antiplatelet therapy: N/A 3. Pain Management:  hydrocodone prn for severe pain  - Tramadol prn for moderate pain.  5/17 - fair to reasonable pain control   4. Mood/anxiety/sleep: LCSW to follow for evaluation and support.              -antipsychotic agents: N/A  -xanax bid prn anxiety  -continue trazodone for sleep 5. Neuropsych: This patient is not fully capable of making decisions on his own behalf. 6. Skin/Wound Care: Pressure relief measures.  7. Fluids/Electrolytes/Nutrition: NPO with tube feeds for nutritional support.             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               --?inflammatory subglottic mucosal, negative for malignancy             --chest PT, sx, OOB  -vest to help with secretions  5/20 stable   -downsize week of 5/23 per PCCM to coincide with f/u bronch   -PCCM capped trach and he did well yesterday with that--repeat today 9. Acute on chronic respiratory failure: Encourage pulmonary hygiene              --  continue Pulmicort nebs bid.  10.  PAF:  Monitor HR tid--continue Atenolol bid with Cardizem tid.   -HR controlled 5/9 10. Hyponatremia: Has resolved  11. Acute blood loss anemia, anemia chronic disease: Monitor with serial checks.   -hgb 8.4 5/6 12. Leukocytosis: WBC continues to fluctuate.   -no new signs of infection  -5/16 14.7  Repeat CBC tomorrow 13. Crohn's disease:    - once able to swallow pills, can try adding home Crohn's meds to help.    5/19 stools type 4 yesterday   -imodium decreased. Continue questran qid   -dc'ed pentasa as nursing can't get it down cortrak tube 14.  Bilateral pleural effusion: Pulmonary hygiene w/ flutter valve.              --continue to monitor for symptoms with increase in activity.   15. Aspiration PNA: zosyn completed 5/5 16. Dysphagia: Continue NPO with Glucerna               -MBS 5/12 per SLP demonstrated no improvement in peri-glottic mass/area nor improvement in swallow  -5/20 IR unable to place PEG d/t mesh in anterior abdomen over stomach  -general surgery has discussed with family. Appreciate their help   -cards consult requested   -transition to IV heparin 17. T2DM: Was on Humalog tid prior to admission.             --will monitor BS every 4 hours as on tube feeds and use SSI prn    CBG (last 3)  Recent Labs    10/26/20 2358 10/27/20 0402 10/27/20 0917  GLUCAP 161* 162* 185*  5/20: fair control. Consider adding lantus  18. Urine retention: appreciate urology placement of foley catheter  -continue foley this weekend. Consider another voiding trial next  week.       LOS: 15 days A FACE TO [place] 10/27/2020, 11:11 AM

## 2020-10-27 NOTE — Progress Notes (Addendum)
[place] for transition apixaban to heparin in anticipation of surgery Indication: atrial fibrillation  No Known Allergies  Patient Measurements: Weight: 85 kg (187 lb 6.3 oz) Heparin Dosing Weight: 85 kg  Vital Signs: Temp: 98.1 F (36.7 C) (05/20 0402) Temp Source: Oral (05/20 0402) BP: 114/51 (05/20 0402) Pulse Rate: 69 (05/20 0402)  Labs: Recent Labs    10/25/20 0524 10/26/20 1407 10/27/20 0628  HGB 8.8*  --  9.5*  HCT 29.4*  --  30.5*  PLT 227  --  236  APTT  --  42* 75*  HEPARINUNFRC  --  >1.10* >1.10*  CREATININE 1.00  --   --     Estimated Creatinine Clearance: 66.9 mL/min (by C-G formula based on SCr of 1 mg/dL).   Assessment: 80 year old male with history Afib on apixaban. Pharmacy is consulted to transition patient from apixaban to heparin drip for upcoming surgery.   Heparin level >1.1 and still being affected by apixaban. APTT is therapeutic at 75 sec on 1200 units/hr. No bleeding noted, Hgb stable in 8-9s, platelets are normal.  Goal of Therapy:  Heparin level 0.3-0.7 units/ml aPTT 66-102 seconds Monitor platelets by anticoagulation protocol: Yes   Plan:  Continue heparin infusion at 1200 units/hr Confirm aPTT at 12:30 Daily heparin level, aPTT, CBC Monitor for s/sx of bleeding  Thank you for involving pharmacy in this patient's care.  Renold Genta, PharmD, BCPS Clinical Pharmacist Clinical phone for 10/27/2020 until 3p is [phone] 10/27/2020 7:43 AM  **Pharmacist phone directory can be found on [email or web address] listed under [place]**  Addendum: Confirm aPTT is on low end of goal at 67 sec. No bleeding noted.  Increase heparin drip to 1300 units/hr F/U in am  [facility], PharmD, BCPS 1:03 PM

## 2020-10-27 NOTE — Consult Note (Addendum)
Cardiology Consultation:   Patient ID: Devin [name] MRN: [id number]; DOB: Dec 06, 1940  Admit date: 10/12/2020 Date of Consult: 10/27/2020  PCP:  Prince Solian, MD   [facility] Providers Cardiologist:  Sanda Klein, MD        Patient Profile:   Devin [name] is a 80 y.o. male with a PMH of paroxysmal atrial fibrillation, HTN, DM type 2, non-small cell lung cancer s/p VATS and lumpectomy, ankylosing spondylitis, Crohn's disease, prostate cancer, thyroid cancer s/p RAI and total thyroidectomy now with hypothyroidism, CKD stage 3, who is being seen 10/27/2020 for the evaluation of preop assessment at the request of Dr. Naaman Plummer.  History of Present Illness:   [name] has had a lengthy hospital stay including several admissions/readmissions to [facility] and CIR. He was initially admitted 09/10/20 with acute on chronic hypercarbic respiratory failure, T11 fracture, and agitation. He underwent Fusion of [id number] 0/2/72 complicated by difficult extubation, ultimately requiring tracheostomy 09/19/20. He then had trouble with copious secretions and klebsiella PNA managed with CTX. Nephrology followed for issues with hyperkalemia and tube feeds were adjusted. He had a biopsy of a supraglottic mass which was negative for malignancy. He was ultimately admitted to [facility] 10/06/20 with course complicated by urinary retention requiring in and out caths, as well as aspiration PNA requiring broad-spectrum antibiotics after pulling his feeding tube 10/07/20. Unfortunately developed encephalopathy with worsening hypoxia and concerns for sepsis, prompting readmission that day. He underwent bronchoscopy for obstructed trach with placement of XLT. He was deemed stable for discharge back to CIR 10/12/20. General surgery consulted for more definitive feeding options for managing his dysphagia - considering placing a gastrostomy tube in the OR due to history of abdominal mesh over stomach. Cardiology was asked to  evaluate for preop assessment.   The patient was seen with daughter at bedside. He has been making slow and steady progress over the past couple weeks. He is able to ambulate about 157f in the hallway before he needs to take a break due to weakness. He has not been limited in activity by chest pain or SOB. He has had some LE edema which is generally improved. He denies any palpitations, dizziness, lightheadedness, syncope, orthopnea, or PND.    Past Medical History:  Diagnosis Date  . A-fib ([place]   . Adenocarcinoma of left lung, stage 1 ([place] 03/10/2015  . Ankylosing spondylitis ([place]    Diagnosed during lumbar fracture summer of 2016    . Crohn's disease ([place]   . Gout   . HIstory of basal cell cancer of face    THYROID CA HX  . History of kidney stones   . Hypertension   . Hypothyroidism   . Impotence   . 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 Obesity (BMI 30-39.9)   . Osteoporosis    Pt completed 5 years of fosamax in 2013     . Prostate cancer with recurrence    Treated with prostatectomy with recurrence 2012 with Lupron treatment now him   . Psoriasis   . Thyroid cancer ([place] 1999   Treated with RAI and total thyroidectomy   . Type II diabetes mellitus ([place]     Past Surgical History:  Procedure Laterality Date  . ABDOMINAL EXPLORATION SURGERY     for small bowel obstruction  . APPENDECTOMY  03/2007  . BACK SURGERY    . BASAL CELL CARCINOMA EXCISION  "several"   "head"  . CARDIAC CATHETERIZATION  03/17/2003  . CHOLECYSTECTOMY  N/A 05/07/2016   Procedure: LAPAROSCOPIC CHOLECYSTECTOMY;  Surgeon: Fanny Skates, MD;  Location: [place];  Service: General;  Laterality: N/A;  . COLON SURGERY  03/2007   Resection of cecum, appendix, terminal ileum (approximately/notes 10/10/2010  . CYSTOSCOPY/URETEROSCOPY/HOLMIUM LASER/STENT PLACEMENT Right 07/03/2020   Procedure: CYSTOSCOPY/RETROGRADE/URETEROSCOPY REMOVAL OF BLADDER STONE;  Surgeon: Raynelle Bring, MD;   Location[facility];  Service: Urology;  Laterality: Right;  . DIRECT LARYNGOSCOPY N/A 10/04/2020   Procedure: DIRECT LARYNGOSCOPY, PHARYNGOSCOPY WITH BIOPSY;  Surgeon: [name], DO;  Location: [place];  Service: ENT;  Laterality: N/A;  . ESOPHAGOSCOPY N/A 10/04/2020   Procedure: ESOPHAGOSCOPY WITH BIOPSY;  Surgeon: [name], DO;  Location: [place];  Service: ENT;  Laterality: N/A;  . HERNIA REPAIR    . LAMINECTOMY WITH POSTERIOR LATERAL ARTHRODESIS LEVEL 1 N/A 09/15/2020   Procedure: REVISION OF THORACOLUMBAR FUSION, ADDITION OF CROSS-CONNECTORS;  Surgeon: Eustace Moore, MD;  Location: [place];  Service: Neurosurgery;  Laterality: N/A;  . LAPAROSCOPIC CHOLECYSTECTOMY  05/07/2016  . LAPAROSCOPIC LYSIS OF ADHESIONS  05/07/2016  . LAPAROSCOPIC LYSIS OF ADHESIONS N/A 05/07/2016   Procedure: LAPAROSCOPIC LYSIS OF ADHESIONS TIMES ONE HOUR;  Surgeon: Fanny Skates, MD;  Location: [place];  Service: General;  Laterality: N/A;  . POSTERIOR FUSION THORACIC SPINE  02/08/2016   1. Posterior thoracic arthrodesis T7-T11 utilizing morcellized allograft, 2. Posterior thoracic segmental fixation T7-T11 utilizing nuvasive pedicle screws  . PROSTATECTOMY  06/2001   w/bilateral pelvic lymph nose dissection/notes 10/24/2010  . SPINAL FUSION  12/2014   Open reduction internal fixation of L1 Chance fracture with posterior fusion T10-L4 utilizing morcellized allograft and some local autograft, segmental instrumentation T10-L4 inclusive utilizing nuvasive pedicle screws/notes 12/16/2014  . Stress Cardiolite  02/17/2003  . THOROCOTOMY WITH LOBECTOMY  03/16/2015   Procedure: THOROCOTOMY WITH LOBECTOMY;  Surgeon: Ivin Poot, MD;  Location: [place];  Service: Thoracic;;  . TONSILLECTOMY    . TOTAL THYROIDECTOMY  1997  . Venous Doppler  05/30/2004  . VENTRAL HERNIA REPAIR  04/14/2008  . VIDEO ASSISTED THORACOSCOPY Left 03/16/2015   Procedure: VIDEO ASSISTED THORACOSCOPY;  Surgeon: Ivin Poot, MD;  [facility];  Service: Thoracic;  Laterality: Left;     Home Medications:  Prior to Admission medications   Medication Sig Start Date End Date Taking? Authorizing Provider  acetaminophen (TYLENOL) 160 MG/5ML solution Place 15.6 mLs (500 mg total) into feeding tube every 6 (six) hours. 10/06/20   Terrilee Croak, MD  acetylcysteine (MUCOMYST) 20 % nebulizer solution Take 3 mLs (600 mg total) by nebulization 2 (two) times daily. 10/06/20   Terrilee Croak, MD  albuterol (VENTOLIN HFA) 108 (90 Base) MCG/ACT inhaler Inhale 2 puffs into the lungs every 6 (six) hours as needed for wheezing or shortness of breath. 08/31/20   Brand Males, MD  ALPRAZolam Duanne Moron) 0.5 MG tablet Take 0.5 mg by mouth 2 (two) times daily as needed for anxiety. 02/29/20   [provider]  apixaban (ELIQUIS) 5 MG TABS tablet Take 5 mg by mouth 2 (two) times daily.    [provider]  atenolol (TENORMIN) 25 MG tablet Take 1 tablet (25 mg total) by mouth 2 (two) times daily. 02/09/20   Thurnell Lose, MD  budesonide (PULMICORT) 0.5 MG/2ML nebulizer solution Take 2 mLs (0.5 mg total) by nebulization 2 (two) times daily. 10/06/20   Terrilee Croak, MD  cholestyramine (QUESTRAN) 4 g packet Place 1 packet (4 g total) into feeding tube 2 (two) times daily  before lunch and supper. 10/06/20   Terrilee Croak, MD  diltiazem (CARDIZEM) 10 mg/ml oral suspension Place 3 mLs (30 mg total) into feeding tube every 6 (six) hours. 10/06/20   Terrilee Croak, MD  diphenhydrAMINE (BENADRYL) 25 mg capsule 1 capsule (25 mg total) by Per NG tube route at bedtime as needed for sleep. 10/06/20   Dahal, Marlowe Aschoff, MD  fiber (NUTRISOURCE FIBER) PACK packet Place 1 packet into feeding tube 2 (two) times daily. 10/06/20   Dahal, Marlowe Aschoff, MD  insulin aspart (NOVOLOG) 100 UNIT/ML injection Inject 0-20 Units into the skin every 4 (four) hours. 10/06/20   Dahal, Marlowe Aschoff, MD  insulin glargine (LANTUS) 100 UNIT/ML injection Inject 0.12 mLs (12 Units total) into the skin  2 (two) times daily. 10/06/20   Terrilee Croak, MD  lactobacillus (FLORANEX/LACTINEX) PACK Place 1 packet (1 g total) into feeding tube 3 (three) times daily with meals. 10/12/20   Donne Hazel, MD  levothyroxine (SYNTHROID) 125 MCG tablet Take 1 tablet (125 mcg total) by mouth daily before breakfast. 02/10/20   Thurnell Lose, MD  loperamide HCl (IMODIUM) 1 MG/7.5ML suspension Place 15 mLs (2 mg total) into feeding tube 2 (two) times daily as needed for diarrhea or loose stools. 10/06/20   Terrilee Croak, MD  loperamide HCl (IMODIUM) 1 MG/7.5ML suspension Place 30 mLs (4 mg total) into feeding tube 4 (four) times daily. 10/06/20   Terrilee Croak, MD  nutrition supplement, JUVEN, (JUVEN) PACK Place 1 packet into feeding tube 2 (two) times daily between meals. 10/06/20   Terrilee Croak, MD  Nutritional Supplements (FEEDING SUPPLEMENT, PROSOURCE TF,) liquid Place 45 mLs into feeding tube daily. 10/07/20   Terrilee Croak, MD  Nystatin (GERHARDT'S BUTT CREAM) CREA Apply 1 application topically 2 (two) times daily. 10/06/20   Terrilee Croak, MD  ondansetron (ZOFRAN) 4 MG/2ML SOLN injection Inject 2 mLs (4 mg total) into the vein every 6 (six) hours as needed for nausea or vomiting. 10/06/20   Dahal, Marlowe Aschoff, MD  oxyCODONE (ROXICODONE) 5 MG/5ML solution Place 5 mLs (5 mg total) into feeding tube every 4 (four) hours as needed for moderate pain. 10/06/20   Dahal, Marlowe Aschoff, MD  phenol (CHLORASEPTIC) 1.4 % LIQD Use as directed 1 spray in the mouth or throat as needed for throat irritation / pain. 10/06/20   Dahal, Marlowe Aschoff, MD  piperacillin-tazobactam (ZOSYN) 3.375 GM/50ML IVPB Inject 50 mLs (3.375 g total) into the vein every 8 (eight) hours. 10/12/20   Donne Hazel, MD  tamsulosin (FLOMAX) 0.4 MG CAPS capsule Take 1 capsule (0.4 mg total) by mouth daily. 10/12/20   Donne Hazel, MD  Water For Irrigation, Sterile (FREE WATER) SOLN Place 100 mLs into feeding tube every 8 (eight) hours. 10/06/20   Terrilee Croak, MD     Inpatient Medications: Scheduled Meds: . atenolol  25 mg Per Tube BID  . budesonide (PULMICORT) nebulizer solution  0.5 mg Nebulization BID  . chlorhexidine  15 mL Mouth/Throat BID  . Chlorhexidine Gluconate Cloth  6 each Topical Daily  . cholestyramine  4 g Per Tube QID  . diltiazem  30 mg Per Tube Q8H  . doxazosin  1 mg Per Tube Q supper  . feeding supplement (PROSource TF)  45 mL Per Tube BID  . free water  225 mL Per Tube Q4H  . Gerhardt's butt cream   Topical BID  . insulin aspart  0-9 Units Subcutaneous Q4H  . levothyroxine  125 mcg Per Tube QAC breakfast  . loperamide  HCl  1 mg Per Tube QID  . nutrition supplement (JUVEN)  1 packet Per Tube BID BM  . saccharomyces boulardii  250 mg Per Tube BID  . traZODone  75 mg Per Tube QHS   Continuous Infusions: . feeding supplement (OSMOLITE 1.5 CAL) 1,000 mL (10/27/20 1237)  . heparin 1,300 Units/hr (10/27/20 1310)   PRN Meds: acetaminophen (TYLENOL) oral liquid 160 mg/5 mL, albuterol, ALPRAZolam, alum & mag hydroxide-simeth, bisacodyl, diphenhydrAMINE, diphenoxylate-atropine, guaiFENesin-dextromethorphan, HYDROcodone-acetaminophen, lidocaine, lidocaine, milk and molasses, ondansetron (ZOFRAN) IV, prochlorperazine **OR** prochlorperazine **OR** prochlorperazine, traMADol  Allergies:   No Known Allergies  Social History:   Social History   Socioeconomic History  . Marital status: Married    Spouse name: Not on file  . Number of children: Not on file  . Years of education: Not on file  . Highest education level: Not on file  Occupational History  . Occupation: Retired  Tobacco Use  . Smoking status: Former Smoker    Packs/day: 2.00    Years: 30.00    Pack years: 60.00    Types: Cigarettes    Quit date: 12/26/1989    Years since quitting: 30.8  . Smokeless tobacco: Never Used  Vaping Use  . Vaping Use: Never used  Substance and Sexual Activity  . Alcohol use: Not Currently    Alcohol/week: 0.0 standard drinks  .  Drug use: No  . Sexual activity: Never  Other Topics Concern  . Not on file  Social History Narrative   Retired Development worker, international aid   Social Determinants of Radio broadcast assistant Strain: Not on [employer] Insecurity: Not on file  Transportation Needs: Not on file  Physical Activity: Not on file  Stress: Not on file  Social Connections: Not on file  Intimate Partner Violence: Not on file    Family History:    Family History  Problem Relation Age of Onset  . CAD Mother   . Cancer Neg Hx        No cancer in the patient's immediate family, except of course for the patient himself, as noted     ROS:  Please see the history of present illness.   All other ROS reviewed and negative.     Physical Exam/Data:   Vitals:   10/27/20 0916 10/27/20 1156 10/27/20 1211 10/27/20 1546  BP: (!) 104/52 127/61  (!) 106/45  Pulse: 70 74 68 73  Resp: 17 17 18 17   Temp: 99.5 F (37.5 C) 100 F (37.8 C)  98.4 F (36.9 C)  TempSrc: Oral Oral  Oral  SpO2: 95% 95% (!) 89% 95%  Weight:        Intake/Output Summary (Last 24 hours) at 10/27/2020 1636 Last data filed at 10/27/2020 1629 Gross per 24 hour  Intake 583.28 ml  Output 4251 ml  Net -3667.72 ml   Last 3 Weights 10/27/2020 10/26/2020 10/25/2020  Weight (lbs) 187 lb 6.3 oz 187 lb 6.3 oz (No Data)  Weight (kg) 85 kg 85 kg (No Data)     Body mass index is 27.66 kg/m.  General:  Chronically ill appearing gentleman sitting upright in bed in NAD HEENT: sclera anicteric Neck: trach is capped Vascular: No carotid bruits; distal pulses 2+ bilaterally  Cardiac:  normal S1, S2; RRR; no murmurs, rubs, or gallops Lungs:  clear to auscultation bilaterally, no wheezing, rhonchi or rales  Abd: soft, nontender, no hepatomegaly  Ext: trace LE edema Musculoskeletal:  No deformities, BUE and BLE strength normal and equal  Skin: warm and dry  Neuro:  CNs 2-12 intact, no focal abnormalities noted Psych:  Normal affect   EKG:  The EKG was personally  reviewed and demonstrates:  From 10/05/20 - sinus rhythm with rate 68 bpm, incomplete RBBB, no STE/D   Relevant CV Studies: Echocardiogram 12/2019: 1. Left ventricular ejection fraction, by estimation, is 60 to 65%. The  left ventricle has normal function. The left ventricle has no regional  wall motion abnormalities. There is mild left ventricular hypertrophy of  the posterior segment. Left  ventricular diastolic parameters are consistent with Grade I diastolic  dysfunction (impaired relaxation).  2. Right ventricular systolic function is normal. The right ventricular  size is normal. There is normal pulmonary artery systolic pressure.  3. The mitral valve is normal in structure. Mild mitral valve  regurgitation. No evidence of mitral stenosis.  4. The aortic valve is tricuspid. Aortic valve regurgitation is not  visualized. No aortic stenosis is present.  5. The inferior vena cava is normal in size with greater than 50%  respiratory variability, suggesting right atrial pressure of 3 mmHg.  NST 12/2019:  There was no ST segment deviation noted during stress.  Nuclear stress EF: 69%.  The left ventricular ejection fraction is hyperdynamic (>65%).  The study is normal.  This is a low risk study.   Laboratory Data:  High Sensitivity Troponin:  No results for input(s): TROPONINIHS in the last 720 hours.   Chemistry Recent Labs  Lab 10/21/20 0958 10/23/20 1033 10/25/20 0524  NA 127* 129* 130*  K 5.1 5.1 5.1  CL 89* 90* 92*  CO2 31 32 34*  GLUCOSE 134* 195* 169*  BUN 45* 42* 41*  CREATININE 1.03 0.99 1.00  CALCIUM 8.5* 9.1 8.9  GFRNONAA >60 >60 >60  ANIONGAP 7 7 4*    No results for input(s): PROT, ALBUMIN, AST, ALT, ALKPHOS, BILITOT in the last 168 hours. Hematology Recent Labs  Lab 10/23/20 1033 10/25/20 0524 10/27/20 0628  WBC 14.7* 9.4 10.1  RBC 3.13* 2.92* 3.12*  HGB 9.4* 8.8* 9.5*  HCT 30.9* 29.4* 30.5*  MCV 98.7 100.7* 97.8  MCH 30.0 30.1 30.4   MCHC 30.4 29.9* 31.1  RDW 16.3* 16.1* 15.7*  PLT 263 227 236   BNPNo results for input(s): BNP, PROBNP in the last 168 hours.  DDimer No results for input(s): DDIMER in the last 168 hours.   Radiology/Studies:  CT ABDOMEN WO CONTRAST  Result Date: 10/25/2020 CLINICAL DATA:  Assess anatomy for G-tube placement EXAM: CT ABDOMEN WITHOUT CONTRAST TECHNIQUE: Multidetector CT imaging of the abdomen was performed following the standard protocol without IV contrast. COMPARISON:  09/10/2020 FINDINGS: Lower chest: Small bilateral pleural effusions. Linear opacities at the lung bases likely due to atelectasis. There is airspace opacity in the right lower lobe suspicious for pneumonia. Hepatobiliary: Calcification located in the subcapsular region of the posterior right hepatic lobe may be due to prior granulomatous inflammation. The liver otherwise unremarkable. The gallbladder is absent. Pancreas: Unremarkable. No pancreatic ductal dilatation or surrounding inflammatory changes. Spleen: Normal in size without focal abnormality. Adrenals/Urinary Tract: Single punctate bilateral nonobstructing renal calculi. Kidneys and visualized segments of the ureters otherwise unremarkable. No significant abnormality of the adrenal glands. Stomach/Bowel: NG tube terminates in the distal gastric lumen. 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 No dilated bowel loops noted within the visualized abdomen. Vascular/Lymphatic: No enlarged abdominal lymph nodes. Atherosclerotic plaque seen scattered throughout the abdominal aorta. Other: Anterior abdominal wall surgical mesh material is noted. Areas of  subcutaneous fat stranding in the anterior abdominal wall subcutaneous fat likely sites of medication administration. Musculoskeletal: Diffuse osteopenia extensive posterior fusion changes of the thoracolumbar spine. Fracture noted through the inferior endplate of Y56 vertebral body. IMPRESSION: 1. Surgical mesh material interposed  between the stomach and anterior abdominal wall. 2. Right lower lobe airspace opacities suspicious for pneumonia. Electronically Signed   By: Miachel Roux M.D.   On: 10/25/2020 08:28   DG Abd Portable 1V  Result Date: 10/25/2020 CLINICAL DATA:  Feeding tube placement EXAM: PORTABLE ABDOMEN - 1 VIEW COMPARISON:  10/15/2020 FINDINGS: Extensive spinal hardware redemonstrated. Esophageal tube tip overlies the gastroduodenal junction. Evidence of prior hernia repair. Visible gas pattern is unobstructed. IMPRESSION: Feeding tube tip overlies the gastroduodenal region. Electronically Signed   By: Donavan Foil M.D.   On: 10/25/2020 16:46     Assessment and Plan:   1. Preop assessment: patient is anticipating gastric tube placement in the OR for more definitive feeding options given issues with dysphagia. He has been admitted to the hospital/CIR for nearly 2 months. He is quite debilitated from his lengthy hospital stay. He is unable to complete 4 METs, though has no anginal complaints when ambulating in the hallway. He has no known heart disease history and no evidence of coronary artery calcifications on prior CT scans. He had a reassuring echocardiogram 12/2019 showing EF 60-65%, G1DD, no RWMA, and mild MR, as well as a NST at that time which was low risk without ischemia.  - He has a revised cardiac risk index of 1 (high risk surgery), with a 0.9% risk of adverse cardiac events in the perioperative setting. He is likely moderate risk due to his history of paroxysmal atrial fibrillation and debilitated state. - No further cardiac work-up needed prior to surgery - Would continue diltiazem and atenolol in the perioperative setting to minimize afib complications.  - Patient has been transitioned to IV heparin in anticipation of surgery - would restart home apixaban when cleared to do so by his surgeon  2. Paroxysmal atrial fibrillation: maintaining sinus rhythm with good rate control. Apixaban on hold in  anticipation of possible surgery - Continue diltiazem and atenolol for rate control - Resume home apixaban when cleared to do so by surgery.   3. HTN: BP well controlled - Continue diltiazem and atenolol  Remainder of care per primary team, PCCM, and surgery.    Risk Assessment/Risk Scores:          CHA2DS2-VASc Score = 4  This indicates a 4.8% annual risk of stroke. The patient's score is based upon: CHF History: No HTN History: Yes Diabetes History: Yes Stroke History: No Vascular Disease History: No Age Score: 2 Gender Score: 0       For questions or updates, please contact [place] Please consult [email or web address] for contact info under    Signed, Abigail Butts, PA-C  10/27/2020 4:36 PM  Patient seen and examined with Roby Lofts PA-C.  Agree as above, with the following exceptions and changes as noted below.  80 year old male with paroxysmal A. fib, hypertension, diabetes type 2, non-small cell lung cancer who has been chronically debilitated with multiple medical illnesses and protracted hospital and rehab stay who is now intended to have a surgically placed gastrostomy tube.  Cardiology consulted for preoperative risk stratification. Gen: Appears ill but in no acute distress, CV: RRR, no murmurs, Lungs: clear, Abd: soft, Extrem: Warm, well perfused, trace bilateral edema, Neuro/Psych: alert and oriented x 3, normal mood and  affect. All available labs, radiology testing, previous records reviewed.  Patient is overall intermediate risk for likely intermediate risk surgery.  He is not experiencing angina and has no coronary calcifications on a recent chest CT.  No further cardiovascular testing is required prior to the procedure.  If this level of risk is acceptable to the patient and surgical team, the patient should be considered optimized from a cardiovascular standpoint.   Elouise Munroe, MD 10/27/20 9:26 PM

## 2020-10-27 NOTE — Progress Notes (Signed)
Discussed case with Dr. Donne Hazel who will be the Acute Care General Surgeon on next week. He will plan to meet with the patient on Monday to discuss procedure with tentative plan for the OR (open gastrostomy tube placement) on Tuesday. I updated the patient on the plan.

## 2020-10-27 NOTE — Progress Notes (Signed)
Physical Therapy Session Note  Patient Details  Name: Devin [name] MRN: [id number] Date of Birth: 05-Sep-1940  Today's Date: 10/27/2020 PT Individual Time: 1000-1030 PT Individual Time Calculation (min): 30 min   Short Term Goals: Week 2:  PT Short Term Goal 1 (Week 2): Pt will perform bed mobility consistently with CGA. PT Short Term Goal 1 - Progress (Week 2): Met PT Short Term Goal 2 (Week 2): Pt will perform bed to chair consistently with CGA. PT Short Term Goal 2 - Progress (Week 2): Progressing toward goal PT Short Term Goal 3 (Week 2): Pt will ambulate 88' with minA and LRAD. PT Short Term Goal 3 - Progress (Week 2): Met  Skilled Therapeutic Interventions/Progress Updates: Pt presents sitting in recliner w/ daughter present, stating LB pain and wants to return to bed.  Pt agrees to therapy treatment.  Pt transfers sit to stand w/ min A and then amb multiple trials w/ min A and RW up to 80' including turns to return to seat.  Pt requires verbal cues for posture and walker management especially w/ turns.  Pt returned to bed after final trial and required min A for reverse log roll to left sidelying after total A to doff TLSO.  Pt wished to remain sidelying w/ pillow between knees for comfort.  Bed alarm on and all needs in reach.  DME/adaptive equipment instruction;Ambulation/gait training;Community reintegration;Neuromuscular re-education;Psychosocial support;Stair training;UE/LE Strength taining/ROM;Wheelchair propulsion/positioning;Balance/vestibular training;Discharge planning;Functional electrical stimulation;Pain management;Skin care/wound management;Therapeutic Activities;UE/LE Coordination activities;Cognitive remediation/compensation;Disease management/prevention;Functional mobility training;Patient/family education;Splinting/orthotics;Therapeutic Exercise;Visual/perceptual remediation/compensation   Therapy Documentation Precautions:  Precautions Precautions:  Fall,Back Precaution Booklet Issued: No Precaution Comments: rectal tube, trach collar, cortrak, TLSO Required Braces or Orthoses: Spinal Brace Spinal Brace: Thoracolumbosacral orthotic,Applied in sitting position Restrictions Weight Bearing Restrictions: No General:   Vital Signs: Therapy Vitals Temp: 100 F (37.8 C) Temp Source: Oral Pulse Rate: 74 Resp: 17 BP: 127/61 Patient Position (if appropriate): Lying Oxygen Therapy SpO2: 95 % O2 Device: Room Air Pain: Pain Assessment Pain Scale: 0-10 Pain Score: 4  Faces Pain Scale: Hurts a little bit 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

## 2020-10-27 NOTE — Progress Notes (Signed)
   10/27/20 2157  Therapy Vitals  Pulse Rate 77  Resp 17  Patient Position (if appropriate) Lying  MEWS Score/Color  MEWS Score 0  MEWS Score Color Green  Respiratory Assessment  Assessment Type Assess only  Respiratory Pattern Regular;Unlabored  Chest Assessment Chest expansion symmetrical  Bilateral Breath Sounds Clear  Oxygen Therapy/Pulse Ox  O2 Therapy Room air  SpO2 93 %  Tracheostomy Shiley XLT Distal 6 mm Uncuffed;Distal  Placement Date/Time: 10/10/20 0900   Placed By: (c) Other (Comment);ICU physician  Brand: Shiley XLT Distal  Size (mm): 6 mm  Style: Uncuffed;Distal  Status Capped;Secured  Site Assessment Dry;Clean  Site Care Dried  Inner Cannula Care Cleansed/dried  Ties Assessment Dry;Secure;Clean  Cuff Pressure (cm H2O)  (CFLS)  Tracheostomy Equipment at bedside Yes and checklist posted at head of bed  Respiratory  Airway LDA Tracheostomy  Pt. Capped tolerating

## 2020-10-27 NOTE — Progress Notes (Signed)
Patient ID: CALEN GEISTER, male   DOB: 25-Dec-1940, 80 y.o.   MRN: [id number]  SW received updates from [place]  that referral was declined and unable to accept a trach pt if trach not longer than 6 months.   [facility] ([facility]) declined referral, but reports can always try again next week.  SW sent referral to [facility] and waiting on follow-up.   Loralee Pacas, MSW, [place] Office: [phone] Cell: [phone] Fax: [phone]

## 2020-10-27 NOTE — Progress Notes (Signed)
Pt trach capped per MD Order. Pt to remain capped for 48 hours. Pt with strong dry cough

## 2020-10-27 NOTE — Progress Notes (Signed)
Notified Provider Pam about  Patient's urine via foley, patient denies pain or discomfort at foley site. Foley checked and foley catheter stablization device replaced.  Made patient and spouse aware that writer will continue to monitor patient and foley. Will continue to monitor

## 2020-10-28 DIAGNOSIS — Z794 Long term (current) use of insulin: Secondary | ICD-10-CM

## 2020-10-28 DIAGNOSIS — D62 Acute posthemorrhagic anemia: Secondary | ICD-10-CM

## 2020-10-28 DIAGNOSIS — R339 Retention of urine, unspecified: Secondary | ICD-10-CM

## 2020-10-28 DIAGNOSIS — Z978 Presence of other specified devices: Secondary | ICD-10-CM

## 2020-10-28 DIAGNOSIS — R1310 Dysphagia, unspecified: Secondary | ICD-10-CM

## 2020-10-28 DIAGNOSIS — Z930 Tracheostomy status: Secondary | ICD-10-CM

## 2020-10-28 DIAGNOSIS — E871 Hypo-osmolality and hyponatremia: Secondary | ICD-10-CM

## 2020-10-28 DIAGNOSIS — E1165 Type 2 diabetes mellitus with hyperglycemia: Secondary | ICD-10-CM

## 2020-10-28 LAB — GLUCOSE, CAPILLARY
Glucose-Capillary: 172 mg/dL — ABNORMAL HIGH (ref 70–99)
Glucose-Capillary: 181 mg/dL — ABNORMAL HIGH (ref 70–99)
Glucose-Capillary: 190 mg/dL — ABNORMAL HIGH (ref 70–99)
Glucose-Capillary: 189 mg/dL — ABNORMAL HIGH (ref 70–99)
Glucose-Capillary: 144 mg/dL — ABNORMAL HIGH (ref 70–99)
Glucose-Capillary: 172 mg/dL — ABNORMAL HIGH (ref 70–99)

## 2020-10-28 LAB — APTT: aPTT: 104 seconds — ABNORMAL HIGH (ref 24–36)

## 2020-10-28 LAB — HEPARIN LEVEL (UNFRACTIONATED): Heparin Unfractionated: >1.1 [IU]/mL — ABNORMAL HIGH (ref 0.30–0.70)

## 2020-10-28 NOTE — Progress Notes (Signed)
PROGRESS NOTE   Subjective/Complaints: Patient seen sitting up in bed this morning.  He states he slept fairly overnight.  He notes bowel movements are fair.  He states he is tolerating capping.  He was seen by surgery yesterday, notes reviewed- follow-up plan for Monday with plans for gastrostomy tube placement on Tuesday.  ROS: Denies CP, SOB, N/V/D  Objective:   No results found. Recent Labs    10/27/20 0628  WBC 10.1  HGB 9.5*  HCT 30.5*  PLT 236   No results for input(s): NA, K, CL, CO2, GLUCOSE, BUN, CREATININE, CALCIUM in the last 72 hours.  Intake/Output Summary (Last 24 hours) at 10/28/2020 1201 Last data filed at 10/28/2020 0825 Gross per 24 hour  Intake 440 ml  Output 3251 ml  Net -2811 ml        Physical Exam: Vital Signs Blood pressure (!) 132/59, pulse 68, temperature 97.8 F (36.6 C), temperature source Oral, resp. rate 17, weight 87 kg, SpO2 96 %. Constitutional: No distress . Vital signs reviewed. HENT: Normocephalic.  Atraumatic.  + NG. Neck with trach, capped Eyes: EOMI. No discharge. Cardiovascular: No JVD.  RRR. Respiratory: Normal effort.  No stridor.  Bilateral clear to auscultation. GI: Non-distended.  BS +. GU: + Foley Skin: Warm and dry.  Intact. Psych: Normal mood.  Normal behavior. Musc: No edema in extremities.  No tenderness in extremities. Neuro: Alert Bilateral upper extremities: 4/5 proximal distal Bilateral lower extremities: Hip flexion, knee extension 4 -/5, ankle dorsiflexion 4/5   Assessment/Plan: 1. Functional deficits which require 3+ hours per day of interdisciplinary therapy in a comprehensive inpatient rehab setting.  Physiatrist is providing close team supervision and 24 hour management of active medical problems listed below.  Physiatrist and rehab team continue to assess barriers to discharge/monitor patient progress toward functional and medical goals  Care  Tool:  Bathing    Body parts bathed by patient: Right arm,Left arm,Chest,Abdomen,Face,Right upper leg,Left upper leg   Body parts bathed by helper: Front perineal area,Buttocks,Right lower leg,Left lower leg     Bathing assist Assist Level: Moderate Assistance - Patient 50 - 74%     Upper Body Dressing/Undressing Upper body dressing   What is the patient wearing?: Hospital gown only    Upper body assist Assist Level: Moderate Assistance - Patient 50 - 74%    Lower Body Dressing/Undressing Lower body dressing      What is the patient wearing?: Incontinence brief     Lower body assist Assist for lower body dressing: Total Assistance - Patient < 25%     Toileting Toileting    Toileting assist Assist for toileting: 2 Helpers     Transfers Chair/bed transfer  Transfers assist  Chair/bed transfer activity did not occur: Safety/medical concerns  Chair/bed transfer assist level: Contact Guard/Touching assist Chair/bed transfer assistive device: Programmer, multimedia   Ambulation assist      Assist level: Contact Guard/Touching assist Assistive device: Walker-rolling Max distance: 15'   Walk 10 feet activity   Assist  Walk 10 feet activity did not occur: Safety/medical concerns  Assist level: Contact Guard/Touching assist Assistive device: Walker-rolling   Walk 50 feet activity  Assist Walk 50 feet with 2 turns activity did not occur: Safety/medical concerns  Assist level: Minimal Assistance - Patient > 75% Assistive device: Walker-rolling    Walk 150 feet activity   Assist Walk 150 feet activity did not occur: Safety/medical concerns         Walk 10 feet on uneven surface  activity   Assist Walk 10 feet on uneven surfaces activity did not occur: Safety/medical concerns         Wheelchair     Assist Will patient use wheelchair at discharge?: No             Wheelchair 50 feet with 2 turns  activity    Assist            Wheelchair 150 feet activity     Assist          Blood pressure (!) 132/59, pulse 68, temperature 97.8 F (36.6 C), temperature source Oral, resp. rate 17, weight 87 kg, SpO2 96 %.  Medical Problem List and Plan: 1.  T11/12 compression fx s/p posterior fusion secondary to fall with debility due to asp PNA/trach/generalzied weakness  Continue CIR 2.  Antithrombotics: -DVT/anticoagulation:  Pharmaceutical:  -Apixaban--> continue heparin gtt in anticipation of G-tube placement on Tuesday  Heparin level elevated on 5/21             -antiplatelet therapy: N/A 3. Pain Management:  hydrocodone prn for severe pain  - Tramadol prn for moderate pain.  Controlled with meds on 5/21 4. Mood/anxiety/sleep: LCSW to follow for evaluation and support.              -antipsychotic agents: N/A  -xanax bid prn anxiety  -continue trazodone for sleep 5. Neuropsych: This patient is not fully capable of making decisions on his own behalf. 6. Skin/Wound Care: Pressure relief measures.  7. Fluids/Electrolytes/Nutrition: NPO with tube feeds for nutritional support. 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               --?inflammatory subglottic mucosal, negative for malignancy             --chest PT, sx, OOB  -vest to help with secretions  Plan to decannulate next week if patient continues to tolerate 9. Acute on chronic respiratory failure: Encourage pulmonary hygiene              --continue Pulmicort nebs bid.  10.  PAF:  Monitor HR tid--continue Atenolol bid with Cardizem tid.   -HR controlled on 5/21 10. Hyponatremia: Has resolved  11. Acute blood loss anemia, anemia chronic disease: Monitor with serial checks.   Hemoglobin 9.5 on 5/20 12. Leukocytosis: Resolved  -no new signs of infection  WBCs 10.1 on 5/20 13. Crohn's disease:    - once able to swallow pills, can try adding home Crohn's  meds to help.    -imodium decreased. Continue questran qid  -dc'ed pentasa as nursing can't get it down cortrak tube 14.  Bilateral pleural effusion: Pulmonary hygiene w/ flutter valve.              --continue to monitor for symptoms with increase in activity.   15. Aspiration PNA: zosyn completed 5/5 16. Dysphagia: Continue NPO with Glucerna               -MBS 5/12 per SLP demonstrated no improvement in peri-glottic mass/area nor improvement in swallow  -5/20 IR unable to place PEG d/t mesh in anterior abdomen  over stomach  -general surgery has discussed with family. Appreciate their help   -cards consult requested   -transitioned to IV heparin   Plan for procedure on Tuesday 17. T2DM type II with hyperglycemia: Was on Humalog tid prior to admission.             --will monitor BS every 4 hours as on tube feeds and use SSI prn    CBG (last 3)  Recent Labs    10/28/20 0409 10/28/20 0857 10/28/20 1119  GLUCAP 172* 190* 189*   Elevated on 5/21, will consider further adjustments after gastrostomy tube placement 18. Urine retention: appreciate urology placement of foley catheter  -continue foley this weekend. Consider another voiding trial next week.   19.  Hyponatremia  Sodium 130 on 5/18, labs ordered for Monday  LOS: 16 days A FACE TO FACE EVALUATION WAS PERFORMED  Devin [name] Lorie Phenix 10/28/2020, 12:01 PM

## 2020-10-28 NOTE — Progress Notes (Signed)
[place] for transition apixaban to heparin in anticipation of surgery Indication: atrial fibrillation  No Known Allergies  Patient Measurements: Weight: 87 kg (191 lb 12.8 oz) Heparin Dosing Weight: 85 kg  Vital Signs: Temp: 98.5 F (36.9 C) (05/21 0405) Temp Source: Oral (05/21 0405) BP: 123/52 (05/21 0405) Pulse Rate: 67 (05/21 0405)  Labs: Recent Labs    10/26/20 1407 10/27/20 0628 10/27/20 1218 10/28/20 0457  HGB  --  9.5*  --   --   HCT  --  30.5*  --   --   PLT  --  236  --   --   APTT 42* 75* 67* 104*  HEPARINUNFRC >1.10* >1.10*  --  >1.10*    Estimated Creatinine Clearance: 67.6 mL/min (by C-G formula based on SCr of 1 mg/dL).   Assessment: 80 year old male with history Afib on apixaban. Pharmacy is consulted to transition patient from apixaban to heparin drip for upcoming surgery.   Heparin level >1.1 and still being affected by apixaban. APTT is slightly supratherapeutic at 104 sec on 1300 units/hr s/p rate increase after being at low end of goal range on 1200 units/hr. No bleeding per RN, Hgb stable in 8-9s, platelets are normal. Given that level is just slightly above goal, will recheck with am labs after slight rate decrease.   Goal of Therapy:  Heparin level 0.3-0.7 units/ml aPTT 66-102 seconds Monitor platelets by anticoagulation protocol: Yes   Plan:  Decrease heparin to 1250 units/h Daily heparin level, aPTT, CBC Monitor for s/sx of bleeding  Thank you for involving pharmacy in this patient's care.  Rebbeca Paul, PharmD PGY1 Pharmacy Resident 10/28/2020 7:16 AM  Please check [email or web address] for unit-specific pharmacy phone numbers.

## 2020-10-28 NOTE — Progress Notes (Signed)
Occupational Therapy Session Note  Patient Details  Name: Devin [name] MRN: [id number] Date of Birth: Apr 24, 1941  Today's Date: 10/28/2020 OT Individual Time: 1601-0932 OT Individual Time Calculation (min): 73 min    Short Term Goals: Week 2:  OT Short Term Goal 1 (Week 2): Pt will complete bathing with mod assist at sit > stand level OT Short Term Goal 1 - Progress (Week 2): Met OT Short Term Goal 2 (Week 2): Pt will don shirt with min assist OT Short Term Goal 2 - Progress (Week 2): Met OT Short Term Goal 3 (Week 2): Pt will complete toilet transfer with min A and LRAD OT Short Term Goal 3 - Progress (Week 2): Met  Skilled Therapeutic Interventions/Progress Updates:    Pt greeted semi-reclined in bed and agreeable to OT treatment session. Pt felt like he needed to go to the bathroom. Pt completed bed mobility with HOB elevated and supervision. OT assist to don TED hose with education on friction reducing bag. OT then donned pt's socks for him 2/2 urgency. TLSO donned with OT assist. Sit<>stand w/ RW and CGA. Pt ambulated into bathroom w/ CGA and verbal cues for RW management as pt rushing and getting away from to walker. Pt sat on regular lower commode with min A to lower down. Pt unable to have BM, and completed 2 sit<>stands at commode to try to help move BM along. Min A and use of grab bars to power up. Pt still unsuccessful after trying for extended time. Functional ambulation in room to the sink in similar fashion. OT encouraged pt to stay standing to wash hands and work on endurance, but he needed a rest break. Respiratory therapy came in and pt agreeable to work on standing endurance during breathing treatment. Pt tolerated standing for 3 minutes on first bout, then after extended rest break and encouragement, stood for 2nd bout for 2 minutes. Pt agreeable to stay up in TIS wc. Pt left up in wc with chair alarm on, call bell in reach, and needs met.  Therapy  Documentation Precautions:  Precautions Precautions: Fall,Back Precaution Booklet Issued: No Precaution Comments: rectal tube, trach collar, cortrak, TLSO Required Braces or Orthoses: Spinal Brace Spinal Brace: Thoracolumbosacral orthotic,Applied in sitting position Restrictions Weight Bearing Restrictions: No Pain:  Pt reports back pain, no number given. Rest and repositioned for comfort.  Other Treatments:     Therapy/Group: Individual Therapy  [name] 10/28/2020, 8:02 AM

## 2020-10-29 DIAGNOSIS — G479 Sleep disorder, unspecified: Secondary | ICD-10-CM

## 2020-10-29 DIAGNOSIS — S22080D Wedge compression fracture of T11-T12 vertebra, subsequent encounter for fracture with routine healing: Secondary | ICD-10-CM

## 2020-10-29 LAB — APTT
aPTT: 125 seconds — ABNORMAL HIGH (ref 24–36)
aPTT: 64 seconds — ABNORMAL HIGH (ref 24–36)

## 2020-10-29 LAB — CBC
HCT: 31.1 % — ABNORMAL LOW (ref 39.0–52.0)
Hemoglobin: 9.6 g/dL — ABNORMAL LOW (ref 13.0–17.0)
MCH: 30.1 pg (ref 26.0–34.0)
MCHC: 30.9 g/dL (ref 30.0–36.0)
MCV: 97.5 fL (ref 80.0–100.0)
Platelets: 251 10*3/uL (ref 150–400)
RBC: 3.19 MIL/uL — ABNORMAL LOW (ref 4.22–5.81)
RDW: 15.6 % — ABNORMAL HIGH (ref 11.5–15.5)
WBC: 8.5 10*3/uL (ref 4.0–10.5)
nRBC: 0 % (ref 0.0–0.2)

## 2020-10-29 LAB — GLUCOSE, CAPILLARY
Glucose-Capillary: 167 mg/dL — ABNORMAL HIGH (ref 70–99)
Glucose-Capillary: 168 mg/dL — ABNORMAL HIGH (ref 70–99)
Glucose-Capillary: 178 mg/dL — ABNORMAL HIGH (ref 70–99)
Glucose-Capillary: 186 mg/dL — ABNORMAL HIGH (ref 70–99)
Glucose-Capillary: 195 mg/dL — ABNORMAL HIGH (ref 70–99)
Glucose-Capillary: 196 mg/dL — ABNORMAL HIGH (ref 70–99)

## 2020-10-29 LAB — HEPARIN LEVEL (UNFRACTIONATED): Heparin Unfractionated: >1.1 IU/mL — ABNORMAL HIGH (ref 0.30–0.70)

## 2020-10-29 MED ORDER — MELATONIN 3 MG PO TABS
3.0000 mg | ORAL_TABLET | Freq: Every day | ORAL | Status: DC
  Administered 2020-10-29 – 2020-10-30 (×2): 3 mg via ORAL
  Filled 2020-10-29 (×2): qty 1

## 2020-10-29 NOTE — Progress Notes (Signed)
[place] for transition apixaban to heparin in anticipation of surgery Indication: atrial fibrillation  No Known Allergies  Patient Measurements: Weight: 87 kg (191 lb 12.8 oz) Heparin Dosing Weight: 85 kg  Vital Signs: Temp: 97.9 F (36.6 C) (05/22 0805) BP: 113/53 (05/22 0805) Pulse Rate: 60 (05/22 0805)  Labs: Recent Labs    10/27/20 0628 10/27/20 1218 10/28/20 0457 10/29/20 0632  HGB 9.5*  --   --  9.6*  HCT 30.5*  --   --  31.1*  PLT 236  --   --  251  APTT 75* 67* 104* 125*  HEPARINUNFRC >1.10*  --  >1.10* >1.10*    Estimated Creatinine Clearance: 67.6 mL/min (by C-G formula based on SCr of 1 mg/dL).   Assessment: 80 year old male with history Afib on apixaban. Pharmacy is consulted to transition patient from apixaban to heparin drip for upcoming surgery - gastrostomy tube placement tentatively planned for Tuesday.   Heparin level >1.1 and still being affected by apixaban. APTT supratherapeutic at 125 sec despite rate decrease to 1250 units/hr. No bleeding or issues with infusion per RN. RN states that labs were drawn from opposite arm as heparin is infusing. Hgb stable 9s, platelets are normal.   Goal of Therapy:  Heparin level 0.3-0.7 units/ml aPTT 66-102 seconds Monitor platelets by anticoagulation protocol: Yes   Plan:  Decrease heparin to 1050 units/h 8h aPTT Daily heparin level, aPTT, CBC Monitor for s/sx of bleeding  Thank you for involving pharmacy in this patient's care.  Rebbeca Paul, PharmD PGY1 Pharmacy Resident 10/29/2020 8:32 AM  Please check [email or web address] for unit-specific pharmacy phone numbers.

## 2020-10-29 NOTE — Progress Notes (Signed)
[place] for transition apixaban to heparin in anticipation of surgery Indication: atrial fibrillation  No Known Allergies  Patient Measurements: Weight: 87 kg (191 lb 12.8 oz) Heparin Dosing Weight: 85 kg  Vital Signs: Temp: 98.5 F (36.9 C) (05/22 1604) Temp Source: Oral (05/22 1604) BP: 118/65 (05/22 1618) Pulse Rate: 66 (05/22 1604)  Labs: Recent Labs    10/27/20 0628 10/27/20 1218 10/28/20 0457 10/29/20 0632 10/29/20 1717  HGB 9.5*  --   --  9.6*  --   HCT 30.5*  --   --  31.1*  --   PLT 236  --   --  251  --   APTT 75*   < > 104* 125* 64*  HEPARINUNFRC >1.10*  --  >1.10* >1.10*  --    < > = values in this interval not displayed.    Estimated Creatinine Clearance: 67.6 mL/min (by C-G formula based on SCr of 1 mg/dL).   Assessment: 80 year old male with history Afib on apixaban. Pharmacy is consulted to transition patient from apixaban to heparin drip for upcoming surgery - gastrostomy tube placement tentatively planned for Tuesday.   APTT slightly subtherapeutic at 64 sec on 1050 units/hr.    Goal of Therapy:  Heparin level 0.3-0.7 units/ml aPTT 66-102 seconds Monitor platelets by anticoagulation protocol: Yes   Plan:  Increase heparin to 1150 units/h Daily heparin level, aPTT, CBC  Hildred Laser, PharmD Clinical Pharmacist **Pharmacist phone directory can now be found on [email or web address] ([email or web address]).  Listed under [place].

## 2020-10-29 NOTE — Progress Notes (Addendum)
PROGRESS NOTE   Subjective/Complaints: Patient seen sitting up in bed this morning.  He states he did not sleep well overnight, due to being in the hospital.  Discussed with nursing as well.  Discussed plans for PEG with patient.  Overnight, called by nursing regarding desaturations-patient stable with desaturation and sleep, however upon further evaluation appeared to be secondary to pulse ox-patient did not require supplemental O2.  ROS: Denies CP, SOB, N/V/D  Objective:   No results found. Recent Labs    10/27/20 0628 10/29/20 0632  WBC 10.1 8.5  HGB 9.5* 9.6*  HCT 30.5* 31.1*  PLT 236 251   No results for input(s): NA, K, CL, CO2, GLUCOSE, BUN, CREATININE, CALCIUM in the last 72 hours.  Intake/Output Summary (Last 24 hours) at 10/29/2020 0918 Last data filed at 10/29/2020 0413 Gross per 24 hour  Intake 0 ml  Output 1951 ml  Net -1951 ml        Physical Exam: Vital Signs Blood pressure (!) 113/53, pulse 60, temperature 97.9 F (36.6 C), resp. rate 18, weight 87 kg, SpO2 95 %. Constitutional: No distress . Vital signs reviewed. HENT: Normocephalic.  Atraumatic.  + NG. Neck: Trach capped Eyes: EOMI. No discharge. Cardiovascular: No JVD.  RRR. Respiratory: Normal effort.  No stridor.  Bilateral clear to auscultation. GI: Non-distended.  BS +. GU: + Foley Skin: Warm and dry.  Intact. Psych: Normal mood.  Normal behavior. Musc: No edema in extremities.  No tenderness in extremities. Neuro: Alert Bilateral upper extremities: 4/5 proximal distal, stable Bilateral lower extremities: Hip flexion, knee extension 4 -/5, ankle dorsiflexion 4/5, stable  Assessment/Plan: 1. Functional deficits which require 3+ hours per day of interdisciplinary therapy in a comprehensive inpatient rehab setting.  Physiatrist is providing close team supervision and 24 hour management of active medical problems listed  below.  Physiatrist and rehab team continue to assess barriers to discharge/monitor patient progress toward functional and medical goals  Care Tool:  Bathing    Body parts bathed by patient: Right arm,Left arm,Chest,Abdomen,Face,Right upper leg,Left upper leg   Body parts bathed by helper: Front perineal area,Buttocks,Right lower leg,Left lower leg     Bathing assist Assist Level: Moderate Assistance - Patient 50 - 74%     Upper Body Dressing/Undressing Upper body dressing   What is the patient wearing?: Hospital gown only    Upper body assist Assist Level: Moderate Assistance - Patient 50 - 74%    Lower Body Dressing/Undressing Lower body dressing      What is the patient wearing?: Incontinence brief     Lower body assist Assist for lower body dressing: Total Assistance - Patient < 25%     Toileting Toileting    Toileting assist Assist for toileting: 2 Helpers     Transfers Chair/bed transfer  Transfers assist  Chair/bed transfer activity did not occur: Safety/medical concerns  Chair/bed transfer assist level: Contact Guard/Touching assist Chair/bed transfer assistive device: Programmer, multimedia   Ambulation assist      Assist level: Contact Guard/Touching assist Assistive device: Walker-rolling Max distance: 15'   Walk 10 feet activity   Assist  Walk 10 feet activity did not occur:  Safety/medical concerns  Assist level: Contact Guard/Touching assist Assistive device: Walker-rolling   Walk 50 feet activity   Assist Walk 50 feet with 2 turns activity did not occur: Safety/medical concerns  Assist level: Minimal Assistance - Patient > 75% Assistive device: Walker-rolling    Walk 150 feet activity   Assist Walk 150 feet activity did not occur: Safety/medical concerns         Walk 10 feet on uneven surface  activity   Assist Walk 10 feet on uneven surfaces activity did not occur: Safety/medical concerns          Wheelchair     Assist Will patient use wheelchair at discharge?: No             Wheelchair 50 feet with 2 turns activity    Assist            Wheelchair 150 feet activity     Assist          Blood pressure (!) 113/53, pulse 60, temperature 97.9 F (36.6 C), resp. rate 18, weight 87 kg, SpO2 95 %.  Medical Problem List and Plan: 1.  T11/12 compression fx s/p posterior fusion secondary to fall with debility due to asp PNA/trach/generalzied weakness  Continue CIR 2.  Antithrombotics: -DVT/anticoagulation:  Pharmaceutical:  -Apixaban--> continue heparin gtt in anticipation of G-tube placement on Tuesday  Heparin level elevated on 5/22             -antiplatelet therapy: N/A 3. Pain Management:  hydrocodone prn for severe pain  - Tramadol prn for moderate pain.  Controlled with meds on 5/22 4. Mood/anxiety/sleep: LCSW to follow for evaluation and support.              -antipsychotic agents: N/A  -xanax bid prn anxiety  -continue trazodone for sleep 5. Neuropsych: This patient is not fully capable of making decisions on his own behalf. 6. Skin/Wound Care: Pressure relief measures.  7. Fluids/Electrolytes/Nutrition: NPO with tube feeds for nutritional support. 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               --?inflammatory subglottic mucosal, negative for malignancy             --chest PT, sx, OOB  -vest to help with secretions  Plan to decannulate this week if patient continues to tolerate 9. Acute on chronic respiratory failure: Encourage pulmonary hygiene              --continue Pulmicort nebs bid.  10.  PAF:  Monitor HR tid--continue Atenolol bid with Cardizem tid.   -HR controlled on 5/22 10. Hyponatremia: Has resolved  11. Acute blood loss anemia, anemia chronic disease: Monitor with serial checks.   Hemoglobin 9.5 on 5/20 12. Leukocytosis: Resolved  -no new signs of  infection  WBCs 10.1 on 5/20 13. Crohn's disease:    - once able to swallow pills, can try adding home Crohn's meds to help.    -imodium decreased. Continue questran qid  -dc'ed pentasa as nursing can't get it down cortrak tube 14.  Bilateral pleural effusion: Pulmonary hygiene w/ flutter valve.              --continue to monitor for symptoms with increase in activity.   15. Aspiration PNA: zosyn completed 5/5 16. Dysphagia: Continue NPO with Glucerna               -MBS 5/12 per SLP demonstrated no improvement in peri-glottic mass/area  nor improvement in swallow  -5/20 IR unable to place PEG d/t mesh in anterior abdomen over stomach  -general surgery has discussed with family. Appreciate their help   -cards consult requested   -transitioned to IV heparin   Plan for procedure on Tuesday 17. T2DM type II with hyperglycemia: Was on Humalog tid prior to admission.             --will monitor BS every 4 hours as on tube feeds and use SSI prn    CBG (last 3)  Recent Labs    10/29/20 0001 10/29/20 0358 10/29/20 0801  GLUCAP 178* 168* 186*   Elevated on 5/22, will consider further adjustments after gastrostomy tube placement 18. Urine retention: appreciate urology placement of foley catheter  -continue foley. Consider another voiding trial this week.   19.  Hyponatremia  Sodium 130 on 5/18, labs ordered for tomorrow 20.  Sleep disturbance  Continue trazodone  Melatonin added on 5/22  LOS: 17 days A FACE TO FACE EVALUATION WAS PERFORMED  Alexandra Posadas Lorie Phenix 10/29/2020, 9:18 AM

## 2020-10-30 LAB — BASIC METABOLIC PANEL
Anion gap: 5 (ref 5–15)
BUN: 40 mg/dL — ABNORMAL HIGH (ref 8–23)
CO2: 39 mmol/L — ABNORMAL HIGH (ref 22–32)
Calcium: 9.3 mg/dL (ref 8.9–10.3)
Chloride: 85 mmol/L — ABNORMAL LOW (ref 98–111)
Creatinine, Ser: 0.99 mg/dL (ref 0.61–1.24)
GFR, Estimated: >60 mL/min (ref >60)
Glucose, Bld: 126 mg/dL — ABNORMAL HIGH (ref 70–99)
Potassium: 4.8 mmol/L (ref 3.5–5.1)
Sodium: 129 mmol/L — ABNORMAL LOW (ref 135–145)

## 2020-10-30 LAB — APTT: aPTT: 88 seconds — ABNORMAL HIGH (ref 24–36)

## 2020-10-30 LAB — CBC WITH DIFFERENTIAL/PLATELET
Abs Immature Granulocytes: 0.11 10*3/uL — ABNORMAL HIGH (ref 0.00–0.07)
Basophils Absolute: 0.1 10*3/uL (ref 0.0–0.1)
Basophils Relative: 0 %
Eosinophils Absolute: 0.3 10*3/uL (ref 0.0–0.5)
Eosinophils Relative: 2 %
HCT: 31.8 % — ABNORMAL LOW (ref 39.0–52.0)
Hemoglobin: 9.7 g/dL — ABNORMAL LOW (ref 13.0–17.0)
Immature Granulocytes: 1 %
Lymphocytes Relative: 7 %
Lymphs Abs: 0.9 10*3/uL (ref 0.7–4.0)
MCH: 29.8 pg (ref 26.0–34.0)
MCHC: 30.5 g/dL (ref 30.0–36.0)
MCV: 97.5 fL (ref 80.0–100.0)
Monocytes Absolute: 0.8 10*3/uL (ref 0.1–1.0)
Monocytes Relative: 7 %
Neutro Abs: 10.2 10*3/uL — ABNORMAL HIGH (ref 1.7–7.7)
Neutrophils Relative %: 83 %
Platelets: 266 10*3/uL (ref 150–400)
RBC: 3.26 MIL/uL — ABNORMAL LOW (ref 4.22–5.81)
RDW: 15.7 % — ABNORMAL HIGH (ref 11.5–15.5)
WBC: 12.3 10*3/uL — ABNORMAL HIGH (ref 4.0–10.5)
nRBC: 0 % (ref 0.0–0.2)

## 2020-10-30 LAB — GLUCOSE, CAPILLARY
Glucose-Capillary: 123 mg/dL — ABNORMAL HIGH (ref 70–99)
Glucose-Capillary: 139 mg/dL — ABNORMAL HIGH (ref 70–99)
Glucose-Capillary: 151 mg/dL — ABNORMAL HIGH (ref 70–99)
Glucose-Capillary: 152 mg/dL — ABNORMAL HIGH (ref 70–99)
Glucose-Capillary: 155 mg/dL — ABNORMAL HIGH (ref 70–99)
Glucose-Capillary: 163 mg/dL — ABNORMAL HIGH (ref 70–99)
Glucose-Capillary: 207 mg/dL — ABNORMAL HIGH (ref 70–99)

## 2020-10-30 LAB — HEPARIN LEVEL (UNFRACTIONATED): Heparin Unfractionated: 1.04 IU/mL — ABNORMAL HIGH (ref 0.30–0.70)

## 2020-10-30 MED ORDER — HEPARIN (PORCINE) 25000 UT/250ML-% IV SOLN
1150.0000 [IU]/h | INTRAVENOUS | Status: AC
  Administered 2020-10-30: 1150 [IU]/h via INTRAVENOUS
  Filled 2020-10-30: qty 250

## 2020-10-30 MED ORDER — CEFAZOLIN SODIUM-DEXTROSE 2-4 GM/100ML-% IV SOLN
2.0000 g | INTRAVENOUS | Status: AC
  Administered 2020-10-31: 2 g via INTRAVENOUS

## 2020-10-30 NOTE — Progress Notes (Signed)
Subjective: Anxious, no new complaints  ROS: See above, otherwise other systems negative  Objective: Vital signs in last 24 hours: Temp:  [98.4 F (36.9 C)-98.7 F (37.1 C)] 98.4 F (36.9 C) (05/23 0815) Pulse Rate:  [60-72] 60 (05/23 0815) Resp:  [16-20] 17 (05/23 0815) BP: (110-123)/(42-71) 123/53 (05/23 0815) SpO2:  [94 %-100 %] 95 % (05/23 0815) FiO2 (%):  [21 %] 21 % (05/23 0718) Weight:  [84 kg] 84 kg (05/23 0500) Last BM Date: 10/29/20  Intake/Output from previous day: 05/22 0701 - 05/23 0700 In: 0  Out: 2350 [Urine:2350] Intake/Output this shift: No intake/output data recorded.  PE: Heart: regular Lungs: CTAB Abd: soft, NT, ND, +BS, mesh palpable under skin  Lab Results:  Recent Labs    10/29/20 0632 10/30/20 0617  WBC 8.5 12.3*  HGB 9.6* 9.7*  HCT 31.1* 31.8*  PLT 251 266   BMET Recent Labs    10/30/20 0617  NA 129*  K 4.8  CL 85*  CO2 39*  GLUCOSE 126*  BUN 40*  CREATININE 0.99  CALCIUM 9.3   PT/INR No results for input(s): LABPROT, INR in the last 72 hours. CMP     Component Value Date/Time   NA 129 (L) 10/30/2020 0617   NA 144 02/07/2020 1506   NA 140 02/18/2017 1011   K 4.8 10/30/2020 0617   K 4.5 02/18/2017 1011   CL 85 (L) 10/30/2020 0617   CO2 39 (H) 10/30/2020 0617   CO2 22 02/18/2017 1011   GLUCOSE 126 (H) 10/30/2020 0617   GLUCOSE 135 02/18/2017 1011   GLUCOSE 85 03/18/2006 0835   BUN 40 (H) 10/30/2020 0617   BUN 23 02/07/2020 1506   BUN 32.1 (H) 02/18/2017 1011   CREATININE 0.99 10/30/2020 0617   CREATININE 1.39 (H) 09/06/2019 0924   CREATININE 1.9 (H) 02/18/2017 1011   CALCIUM 9.3 10/30/2020 0617   CALCIUM 9.8 02/18/2017 1011   PROT 5.3 (L) 10/13/2020 0514   PROT 6.9 02/18/2017 1011   ALBUMIN 2.2 (L) 10/13/2020 0514   ALBUMIN 3.7 02/18/2017 1011   AST 14 (L) 10/13/2020 0514   AST 25 09/06/2019 0924   AST 35 (H) 02/18/2017 1011   ALT 10 10/13/2020 0514   ALT 20 09/06/2019 0924   ALT 35 02/18/2017 1011    ALKPHOS 99 10/13/2020 0514   ALKPHOS 102 02/18/2017 1011   BILITOT 0.1 (L) 10/13/2020 0514   BILITOT 0.4 09/06/2019 0924   BILITOT 0.31 02/18/2017 1011   GFRNONAA >60 10/30/2020 0617   GFRNONAA 49 (L) 09/06/2019 0924   GFRAA >60 02/09/2020 0355   GFRAA 57 (L) 09/06/2019 0924   Lipase     Component Value Date/Time   LIPASE 21 09/10/2020 1031       Studies/Results: No results found.  Anti-infectives: Anti-infectives (From admission, onward)   Start     Dose/Rate Route Frequency Ordered Stop   10/31/20 0600  ceFAZolin (ANCEF) IVPB 2g/100 mL premix        2 g 200 mL/hr over 30 Minutes Intravenous On call to O.R. 10/30/20 1013 11/01/20 0559   10/13/20 0100  piperacillin-tazobactam (ZOSYN) IVPB 3.375 g        3.375 g 12.5 mL/hr over 240 Minutes Intravenous Every 8 hours 10/12/20 1911 10/13/20 0511       Assessment/Plan Dysphagia -Plan for OR tomorrow for open g-tube placement -Will hold heparin gtt in am -Hold TFs at [facility]  -consent written for  FEN - NPO  p MN VTE - heparin gtt ID - ancef on call to OR   LOS: 18 days    Devin [name] , [facility] 10/30/2020, 10:13 AM Please see Amion for pager number during day hours 7:00am-4:30pm or 7:00am -11:30am on weekends

## 2020-10-30 NOTE — Progress Notes (Addendum)
PROGRESS NOTE   Subjective/Complaints: Pt had a nervous weekend. Anxious about getting G-tube placed. Wants to "get it over with".  Sleepy this morning. Was pulling on foley  ROS: Limited due to fatigue    Objective:   No results found. Recent Labs    10/29/20 0632 10/30/20 0617  WBC 8.5 12.3*  HGB 9.6* 9.7*  HCT 31.1* 31.8*  PLT 251 266   Recent Labs    10/30/20 0617  NA 129*  K 4.8  CL 85*  CO2 39*  GLUCOSE 126*  BUN 40*  CREATININE 0.99  CALCIUM 9.3    Intake/Output Summary (Last 24 hours) at 10/30/2020 1033 Last data filed at 10/30/2020 0535 Gross per 24 hour  Intake 0 ml  Output 2350 ml  Net -2350 ml        Physical Exam: Vital Signs Blood pressure (!) 123/53, pulse 60, temperature 98.4 F (36.9 C), resp. rate 17, weight 84 kg, SpO2 95 %. Constitutional: No distress . Vital signs reviewed. HEENT: EOMI, oral membranes moist Neck: supple, trach plugged Cardiovascular: RRR without murmur. No JVD    Respiratory/Chest: CTA Bilaterally without wheezes or rales. Normal effort    GI/Abdomen: BS +, non-tender, non-distended Ext: no clubbing, cyanosis, or edema Psych: flat fatigued GU: + Foley with amber urine Skin: Warm and dry.  Intact. Musc: No edema in extremities.  No tenderness in extremities. Neuro: Alert Bilateral upper extremities: 4/5 proximal distal, stable Bilateral lower extremities: Hip flexion, knee extension 4 -/5, ankle dorsiflexion 4/5, stable  Assessment/Plan: 1. Functional deficits which require 3+ hours per day of interdisciplinary therapy in a comprehensive inpatient rehab setting.  Physiatrist is providing close team supervision and 24 hour management of active medical problems listed below.  Physiatrist and rehab team continue to assess barriers to discharge/monitor patient progress toward functional and medical goals  Care Tool:  Bathing    Body parts bathed by patient:  Right arm,Left arm,Chest,Abdomen,Face,Right upper leg,Left upper leg   Body parts bathed by helper: Front perineal area,Buttocks,Right lower leg,Left lower leg     Bathing assist Assist Level: Moderate Assistance - Patient 50 - 74%     Upper Body Dressing/Undressing Upper body dressing   What is the patient wearing?: Hospital gown only    Upper body assist Assist Level: Moderate Assistance - Patient 50 - 74%    Lower Body Dressing/Undressing Lower body dressing      What is the patient wearing?: Incontinence brief     Lower body assist Assist for lower body dressing: Total Assistance - Patient < 25%     Toileting Toileting    Toileting assist Assist for toileting: 2 Helpers     Transfers Chair/bed transfer  Transfers assist  Chair/bed transfer activity did not occur: Safety/medical concerns  Chair/bed transfer assist level: Contact Guard/Touching assist Chair/bed transfer assistive device: Programmer, multimedia   Ambulation assist      Assist level: Contact Guard/Touching assist Assistive device: Walker-rolling Max distance: 15'   Walk 10 feet activity   Assist  Walk 10 feet activity did not occur: Safety/medical concerns  Assist level: Contact Guard/Touching assist Assistive device: Walker-rolling   Walk 50 feet  activity   Assist Walk 50 feet with 2 turns activity did not occur: Safety/medical concerns  Assist level: Minimal Assistance - Patient > 75% Assistive device: Walker-rolling    Walk 150 feet activity   Assist Walk 150 feet activity did not occur: Safety/medical concerns         Walk 10 feet on uneven surface  activity   Assist Walk 10 feet on uneven surfaces activity did not occur: Safety/medical concerns         Wheelchair     Assist Will patient use wheelchair at discharge?: No             Wheelchair 50 feet with 2 turns activity    Assist            Wheelchair 150 feet activity      Assist          Blood pressure (!) 123/53, pulse 60, temperature 98.4 F (36.9 C), resp. rate 17, weight 84 kg, SpO2 95 %.  Medical Problem List and Plan: 1.  T11/12 compression fx s/p posterior fusion secondary to fall with debility due to asp PNA/trach/generalzied weakness  -Continue CIR therapies including PT, OT, and SLP  2.  Antithrombotics: -DVT/anticoagulation:  Pharmaceutical:  -Apixaban--> continue heparin gtt in anticipation of G-tube placement on Tuesday  Heparin level elevated on 5/22             -antiplatelet therapy: N/A 3. Pain Management:  hydrocodone prn for severe pain  - Tramadol prn for moderate pain.  Controlled with meds on 5/23 4. Mood/anxiety/sleep: LCSW to follow for evaluation and support.              -antipsychotic agents: N/A  -xanax bid prn anxiety  -continue trazodone for sleep 5. Neuropsych: This patient is not fully capable of making decisions on his own behalf. 6. Skin/Wound Care: Pressure relief measures.  7. Fluids/Electrolytes/Nutrition: NPO with tube feeds for nutritional support.  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               --?inflammatory subglottic mucosal, negative for malignancy             --chest PT, sx, OOB  -vest to help with secretions  -PCCM following for trach, bronch this week 9. Acute on chronic respiratory failure: Encourage pulmonary hygiene              --continue Pulmicort nebs bid.  10.  PAF:  Monitor HR tid--continue Atenolol bid with Cardizem tid.   -HR controlled on 5/23 10. Hyponatremia: Has resolved  11. Acute blood loss anemia, anemia chronic disease: Monitor with serial checks.   Hemoglobin 9.7 on 5/23 12. Leukocytosis: Resolved  -no new signs of infection  WBCs 12.3 on 5/23 13. Crohn's disease:    - once able to swallow pills, can try adding home Crohn's meds to help.    -imodium decreased. Continue questran  qid  -dc'ed pentasa as nursing can't get it down cortrak tube 14.  Bilateral pleural effusion: Pulmonary hygiene w/ flutter valve.              --continue to monitor for symptoms with increase in activity.   15. Aspiration PNA: zosyn completed 5/5 16. Dysphagia: Continue NPO with Glucerna               -MBS 5/12 per SLP demonstrated no improvement in peri-glottic mass/area nor improvement in swallow  - IR unable  to place PEG d/t mesh in anterior abdomen over stomach  -5/23 general surgery planning on G-tube placement tomorrow   -cards has cleared pt, discussed with pt/family   -  IV heparin     17. T2DM type II with hyperglycemia: Was on Humalog tid prior to admission.             --will monitor BS every 4 hours as on tube feeds and use SSI prn    CBG (last 3)  Recent Labs    10/30/20 0002 10/30/20 0414 10/30/20 0833  GLUCAP 139* 151* 152*   Elevated on 5/23, ssi for now. will consider further adjustments after gastrostomy tube placement 18. Urine retention: appreciate urology placement of foley catheter  -continue foley. Consider another voiding trial this week after G-tube placed.   19.  Hyponatremia  Sodium 130 on 5/18, 129 5/23---continue to follow 20.  Sleep disturbance  Continue trazodone  Melatonin added on 5/22---observe  LOS: 18 days A FACE TO FACE EVALUATION WAS PERFORMED  Meredith Staggers 10/30/2020, 10:33 AM

## 2020-10-30 NOTE — Progress Notes (Addendum)
Patient's oxgen saturation remained in the upper 90's during the night. Moved the probe to his foreahead as he was fidgeting and removing the probe on his finger several times. Patient slept on and off, same as yesterday despite Melatonin added. Urine had small amount blood clots this morning,will continue to monitor and will notify MD.

## 2020-10-30 NOTE — Progress Notes (Signed)
Patient ID: Devin [name], male   DOB: 01-13-1941, 80 y.o.   MRN: [id number]   SW waiting on follow-up from about HHPT/OT/SLP/SN/aide referral from  [facility]. *referral declined.   SW spoke with Sally/Intake with [facility] [phone]) about referral and waiting on follow-up.   Loralee Pacas, MSW, [place] Office: [phone] Cell: [phone] Fax: [phone]

## 2020-10-30 NOTE — Progress Notes (Signed)
[place] for transition apixaban to heparin in anticipation of surgery Indication: atrial fibrillation  No Known Allergies  Patient Measurements: Weight: 84 kg (185 lb 3 oz) Heparin Dosing Weight: 85 kg  Vital Signs: Temp: 98.4 F (36.9 C) (05/23 0815) Temp Source: Oral (05/23 0416) BP: 123/53 (05/23 0815) Pulse Rate: 60 (05/23 0815)  Labs: Recent Labs    10/28/20 0457 10/29/20 8016 10/29/20 1717 10/30/20 0617  HGB  --  9.6*  --  9.7*  HCT  --  31.1*  --  31.8*  PLT  --  251  --  266  APTT 104* 125* 64* 88*  HEPARINUNFRC >1.10* >1.10*  --  1.04*  CREATININE  --   --   --  0.99    Estimated Creatinine Clearance: 62.5 mL/min (by C-G formula based on SCr of 0.99 mg/dL).   Assessment: 80 year old male with history Afib on apixaban (last dose 5/19 am). Pharmacy is consulted to transition patient from apixaban to heparin drip in anticipation of surgery - gastrostomy tube placement tentatively planned for Tuesday 5/24.   aPTT therapeutic at 88 sec on 1150 units/hr. Heparin level remains elevated from influence of apixaban doses; not accurately reflecting heparin dosing.    Goal of Therapy:  Heparin level 0.3-0.7 units/ml aPTT 66-102 seconds Monitor platelets by anticoagulation protocol: Yes   Plan:  Increase heparin to 1150 units/h Daily heparin level, aPTT, CBC  Shahram Alexopoulos, Pharm.D., BCPS Clinical Pharmacist  **Pharmacist phone directory can be found on [email or web address] listed under [place].  10/30/2020 8:57 AM

## 2020-10-30 NOTE — Progress Notes (Signed)
Occupational Therapy Session Note  Patient Details  Name: Devin [name] MRN: [id number] Date of Birth: 1940/06/16  Today's Date: 10/30/2020 OT Individual Time: 1000-1045 OT Individual Time Calculation (min): 45 min  and Today's Date: 10/30/2020 OT Missed Time: 15 Minutes Missed Time Reason: Patient fatigue  Short Term Goals: Week 2:  OT Short Term Goal 1 (Week 2): Pt will complete bathing with mod assist at sit > stand level OT Short Term Goal 1 - Progress (Week 2): Met OT Short Term Goal 2 (Week 2): Pt will don shirt with min assist OT Short Term Goal 2 - Progress (Week 2): Met OT Short Term Goal 3 (Week 2): Pt will complete toilet transfer with min A and LRAD OT Short Term Goal 3 - Progress (Week 2): Met  Skilled Therapeutic Interventions/Progress Updates:    Pt greeted sitting in TIS wc. Pt wanted to lay back down, but agreeable to participate in UB there-ex with encouragement. Pt brought down to therapy gym and completed 11 minus on UE Ergometer with only 1 short rest break. OT played pt preferred Taylor which was helpful in taking patients mind off of discomfort. Pt ambulated in room 10 feet with RW and min A. Sit<>stand and side steps with min A as well. Pt needed mod A t o elevate feet back in bed with verbal cues for log roll technique to protect spine. Pt asleep as soon as his head hit the pillow. Pt missed 15 minutes of OT treatment session 2/2 fatigue.  Therapy Documentation Precautions:  Precautions Precautions: Fall,Back Precaution Booklet Issued: No Precaution Comments: rectal tube, trach collar, cortrak, TLSO Required Braces or Orthoses: Spinal Brace Spinal Brace: Thoracolumbosacral orthotic,Applied in sitting position Restrictions Weight Bearing Restrictions: No General: General OT Amount of Missed Time: 15 Minutes Pain:   Pt reports some back pain, but no number given. Rest and repositioned for comfort.  Therapy/Group: Individual Therapy  [name] 10/30/2020, 10:58 AM

## 2020-10-30 NOTE — Progress Notes (Signed)
See Dr Anastasia Pall note. I will reach out to ENT. I am pleased to see he is tolerating capping trials but per my discussion earlier in his stay with Dr Hayden Rasmussen with what seems to be both supraglottal and subglottal stenosis we should NOT decannulate him with out him first being examined by ENT. I still think he's a little too deconditioned to consider removing trach at this point anyway but I will call Dr Hayden Rasmussen to discuss his progress and see how she would like to proceed.   Erick Colace ACNP-BC [place] Pager # [phone] OR # [phone] if no answer

## 2020-10-30 NOTE — Progress Notes (Signed)
Physical Therapy Session Note  Patient Details  Name: Devin [name] MRN: [id number] Date of Birth: 1941/04/19  Today's Date: 10/30/2020 PT Individual Time: 0830-0900 PT Individual Time Calculation (min): 30 min   Short Term Goals: Week 3:  PT Short Term Goal 1 (Week 3): Pt will perform bed mobility with supervision. PT Short Term Goal 2 (Week 3): Pt will perform bed to chair consistently with CGA. PT Short Term Goal 3 (Week 3): Pt will ambulate 150' with CGA and LRAD. PT Short Term Goal 4 (Week 3): Pt will complete x5 steps with LHR and minA.  Skilled Therapeutic Interventions/Progress Updates:     Patient in bed with his daughter and NT in the room upon PT arrival. Patient alert and agreeable to PT session. Patient denied pain during session.  Patient initially intremittently falling asleep and demonstrating poor frustration tolerance with limited therapy over the weekend, poor sleep quality, and anxiety over abdominal surgery scheduled for tomorrow. General surgery PA arrived during session for pre-procedure education.   Therapeutic Activity: Bed Mobility: Patient performed supine to sit with CGA with use of hospital bed features. Provided verbal cues for bringing lower extremities off the bed prior to sitting up and performing log roll technique to maintain spinal precautions. Patient sat EOB >5 min with supervision to have medications administered by RN. Donned TLSO with max A with assist from his daughter with mod cues for technique from therapist.  Transfers: Patient performed sit to/from stand x2 with min A, first attempt patient sat back down due to posterior bias, improved with forward trunk facilitation on second trial. Provided verbal cues for hand placement, forward weight shift, and controlled descent.  Gait Training:  Patient ambulated >15 ft in the room using RW with CGA for safety and PT management. Ambulated with decreased gait speed, decreased step length and height,  increased B hip and knee flexion in stance, forward trunk lean, and downward head gaze. Provided verbal cues for erect posture and increased foot clearance for safety.  Patient in [place] w/c with his daughter in the room at end of session with breaks locked, seat blet alarm set, and all needs within reach.    Therapy Documentation Precautions:  Precautions Precautions: Fall,Back Precaution Booklet Issued: No Precaution Comments: rectal tube, trach collar, cortrak, TLSO Required Braces or Orthoses: Spinal Brace Spinal Brace: Thoracolumbosacral orthotic,Applied in sitting position Restrictions Weight Bearing Restrictions: No   Therapy/Group: Individual Therapy  Meeka Cartelli L Dalanie Kisner PT, DPT  10/30/2020, 12:27 PM

## 2020-10-30 NOTE — Progress Notes (Signed)
Physical Therapy Session Note  Patient Details  Name: Devin [name] MRN: [id number] Date of Birth: 1940-11-22  Today's Date: 10/30/2020 PT Individual Time: [phone] PT Individual Time Calculation (min): 70 min   Short Term Goals: Week 1:  PT Short Term Goal 1 (Week 1): Pt will complete bed mobility consistently with minA. PT Short Term Goal 1 - Progress (Week 1): Met PT Short Term Goal 2 (Week 1): Pt will perform bed to chair transfer consistently with minA. PT Short Term Goal 2 - Progress (Week 1): Met PT Short Term Goal 3 (Week 1): Pt will ambulate x25' with minA and LRAD. PT Short Term Goal 3 - Progress (Week 1): Progressing toward goal Week 2:  PT Short Term Goal 1 (Week 2): Pt will perform bed mobility consistently with CGA. PT Short Term Goal 1 - Progress (Week 2): Met PT Short Term Goal 2 (Week 2): Pt will perform bed to chair consistently with CGA. PT Short Term Goal 2 - Progress (Week 2): Progressing toward goal PT Short Term Goal 3 (Week 2): Pt will ambulate 45' with minA and LRAD. PT Short Term Goal 3 - Progress (Week 2): Met Week 3:  PT Short Term Goal 1 (Week 3): Pt will perform bed mobility with supervision. PT Short Term Goal 2 (Week 3): Pt will perform bed to chair consistently with CGA. PT Short Term Goal 3 (Week 3): Pt will ambulate 150' with CGA and LRAD. PT Short Term Goal 4 (Week 3): Pt will complete x5 steps with LHR and minA. Week 4:     Skilled Therapeutic Interventions/Progress Updates:    Pain:  Pt reports buttock pain but would not quantify.  Resistant to verbalizations.  Treatment to tolerance.  Rest breaks and repositioning as needed.  Pt initially supine and agreeable to treatment session w/focus on endurance, strength. Pt supine to sit w/min assist.  Sit to stand to RW w/min assist from low bed, decreased balance w/transition. Gait x 69f/turn/sit to wc w/cga. Total assist to don shoes/socks. Propels wc 510f 3029f/bilat LEs for  strengthening. Sit to stand w/cga, gait x 55f2fcga, cues for posture/gaze/safety.  Pt w/decreased step length bilat and footflat initial contact. 02sats 98%, HR 67 Pt rquires, several min recovery, requires encouragment to continue w/session. Repeated Sit to stand from armchair x 5 reps w/emphasis on achieving full upright posture, cga, RW.  Performed as functional strengthening task. Pt requires several min seated rest. Gait 55ft55fabove, cues for safety.  Pt w/eyes closed during each rest break.  Appears very tired and reports not sleeping at night. Pt given several options of therapeutic activities but refused all. wc propulsion w/bilat feet 25ft,47ft w77fquent cues to continue w/task/pt falling asleep at times. Attempted gait x 25ft bu55f adamantly refused. Sit to stand w/min assist and gait x8t/turn/sit to bed w/RW and cga.  Pt total assist for removal of TLSO and shoes.  Sit to supine w/min assist.   Pt scoots in bed w/mod +2. Pt left supine w/rails up x 4, alarm set, bed in lowest position, and needs in reach.   Therapy Documentation Precautions:  Precautions Precautions: Fall,Back Precaution Booklet Issued: No Precaution Comments: rectal tube, trach collar, cortrak, TLSO Required Braces or Orthoses: Spinal Brace Spinal Brace: Thoracolumbosacral orthotic,Applied in sitting position Restrictions Weight Bearing Restrictions: No    Therapy/Group: Individual Therapy  Devin [name] Devin [email or web address], 3:42 PM

## 2020-10-30 NOTE — Progress Notes (Signed)
Speech Language Pathology Daily Session Note  Patient Details  Name: Devin [name] MRN: [id number] Date of Birth: 05/12/1941  Today's Date: 10/30/2020 SLP Individual Time: 1338-1410 SLP Individual Time Calculation (min): 32 min  Short Term Goals: Week 3: SLP Short Term Goal 1 (Week 3): Patient will utilize an increased vocal intensity at the phrase level to achieve ~90% intelligibility with Min A verbal cues. SLP Short Term Goal 2 (Week 3): Patient will recall swallowing and safety precautions for trials of ice chips wth supervision multimodal cues. SLP Short Term Goal 3 (Week 3): Patient will utilize external memory aids to recall new, daily information with supervision level verbal cues.  Skilled Therapeutic Interventions: Skilled treatment session focused on speech and swallowing goals. Upon arrival, patient's trach was capped and all vitals remained [facility] throughout session.  Patient appeared lethargic and patient reported feeling like he "was in [place]." Patient frequently falling asleep with his mouth open while using the suction requiring Max verbal cues for redirection. Patient performed oral care via the suction toothbrush and required Max verbal cues for cessation. Patient consumed trials of ice chips with consistent throat clearing and oral expectoration of suspected pharyngeal residue. Recommend patient remain NPO. SLP also facilitated session by providing Mod A verbal cues to maximize vocal intensity to increase intelligibility to 90% at the phrase level.  Patient's wife present throughout session. Patient left upright in bed with alarm on and all needs within reach. Continue with current plan of care.                                          Pain No/Denies Pain   Therapy/Group: Individual Therapy  Satoya Feeley 10/30/2020, 2:59 PM

## 2020-10-30 NOTE — Progress Notes (Signed)
NAME:  Devin [name], MRN:  [id number], DOB:  [birth year], LOS: 26 ADMISSION DATE:  10/12/2020, CONSULTATION DATE:  5/3 REFERRING MD:  Verlon Au, CHIEF COMPLAINT:  Dyspnea   History of Present Illness:  80 y/o had a fall and spinal fracture with post admission course complicated by prolonged mechanical ventilation and 2 extubation failures, ultimately requiring a tracheostomy.  There was concern for supra and sub-glottic airway narrowing.  XL tracheostomy ultimately placed on 5/3.  Moved to Rehab and has been working with them there.  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 Was able to bypass with distal XLT. 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 Voice quality better. Feeling stronger   5/16 foley placed by urology. working with OT, talking   5/19 capping trials with tracheostomy  Interim History / Subjective:  Has been doing well with capping trials: no dyspnea, no cough, no mucus production For G-tube tomorrow  Objective   Blood pressure (!) 123/53, pulse 60, temperature 98.4 F (36.9 C), resp. rate 17, weight 84 kg, SpO2 95 %.    FiO2 (%):  [21 %] 21 %   Intake/Output Summary (Last 24 hours) at 10/30/2020 1023 Last data filed at 10/30/2020 0535 Gross per 24 hour  Intake 0 ml  Output 2350 ml  Net -2350 ml   Filed Weights   10/28/20 0500  10/29/20 0400 10/30/20 0500  Weight: 87 kg 87 kg 84 kg    Examination: General:  Frail, elderly male resting comfortably in chair HENT: NCAT OP clear PULM: CTA bases, normal effort CV: RRR, no mgr GI: BS+, soft, nontender MSK: normal bulk and tone Neuro: awake, alert, no distress, MAEW   Labs/imaging that I havepersonally reviewed  (right click and "Reselect all SmartList Selections" daily)  Na low Cr normal Hgb normal WBC 12.3 K  Resolved Hospital Problem list     Assessment & Plan:  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9* 29.4* 30.5* 31.1* 31.8*  MCV 98.7 100.7* 97.8 97.5 97.5  PLT 263 227 236 251 063    Basic Metabolic Panel: Recent Labs  Lab 10/23/20 1033 10/25/20 0524 10/30/20 0617  NA 129* 130* 129*  K 5.1 5.1 4.8  CL 90* 92* 85*  CO2 32 34* 39*  GLUCOSE 195* 169* 126*  BUN 42*  41* 40*  CREATININE 0.99 1.00 0.99  CALCIUM 9.1 8.9 9.3   GFR: Estimated Creatinine Clearance: 62.5 mL/min (by C-G formula based on SCr of 0.99 mg/dL). Recent Labs  Lab 10/25/20 0524 10/27/20 0628 10/29/20 0632 10/30/20 0617  WBC 9.4 10.1 8.5 12.3*    Liver Function Tests: No results for input(s): AST, ALT, ALKPHOS, BILITOT, PROT, ALBUMIN in the last 168 hours. No results for input(s): LIPASE, AMYLASE in the last 168 hours. No results for  input(s): AMMONIA in the last 168 hours.  ABG    Component Value Date/Time   PHART 7.357 10/23/2020 1650   PCO2ART 55.6 (H) 10/23/2020 1650   PO2ART 59.4 (L) 10/23/2020 1650   HCO3 30.4 (H) 10/23/2020 1650   TCO2 23.0 03/17/2015 0540   ACIDBASEDEF 3.6 (H) 03/17/2015 0540   O2SAT 90.3 10/23/2020 1650     Coagulation Profile: No results for input(s): INR, PROTIME in the last 168 hours.  Cardiac Enzymes: No results for input(s): CKTOTAL, CKMB, CKMBINDEX, TROPONINI in the last 168 hours.  HbA1C: Hgb A1c MFr Bld  Date/Time Value Ref Range Status  09/10/2020 03:33 PM 6.4 (H) 4.8 - 5.6 % Final    Comment:    (NOTE) Pre diabetes:          5.7%-6.4%  Diabetes:              >6.4%  Glycemic control for   <7.0% adults with diabetes   07/03/2020 02:46 PM 6.2 (H) 4.8 - 5.6 % Final    Comment:    (NOTE) Pre diabetes:          5.7%-6.4%  Diabetes:              >6.4%  Glycemic control for   <7.0% adults with diabetes     CBG: Recent Labs  Lab 10/29/20 1559 10/29/20 1957 10/30/20 0002 10/30/20 0414 10/30/20 0833  GLUCAP 195* 167* 139* 151* 152*     Critical care time: n/a    Roselie Awkward, MD [facility] Pager: [phone] Cell: [phone] If no response, please call [phone] until 7pm After 7:00 pm call Elink  [phone]

## 2020-10-31 ENCOUNTER — Inpatient Hospital Stay (HOSPITAL_COMMUNITY)
Admission: AD | Admit: 2020-10-31 | Discharge: 2020-11-20 | DRG: 907 | Disposition: A | Discharge status: Other Acute Inpatient Hospital | Payer: Medicare Other | Source: Ambulatory Visit | Attending: Internal Medicine | Admitting: Internal Medicine

## 2020-10-31 ENCOUNTER — Inpatient Hospital Stay: Admit: 2020-10-31 | Payer: Medicare Other | Admitting: General Surgery

## 2020-10-31 ENCOUNTER — Inpatient Hospital Stay (HOSPITAL_COMMUNITY): Payer: Medicare Other

## 2020-10-31 ENCOUNTER — Inpatient Hospital Stay (HOSPITAL_COMMUNITY): Payer: Medicare Other | Admitting: Registered Nurse

## 2020-10-31 ENCOUNTER — Encounter (HOSPITAL_COMMUNITY): Payer: Self-pay | Admitting: Physical Medicine & Rehabilitation

## 2020-10-31 ENCOUNTER — Encounter (HOSPITAL_COMMUNITY)
Admission: RE | Disposition: A | Discharge status: Other Acute Inpatient Hospital | Payer: Self-pay | Source: Intra-hospital | Attending: Physical Medicine & Rehabilitation

## 2020-10-31 ENCOUNTER — Encounter (HOSPITAL_COMMUNITY)
Admission: AD | Disposition: A | Discharge status: Nursing Home (Includes ICF) | Payer: Self-pay | Source: Ambulatory Visit | Attending: Pulmonary Disease

## 2020-10-31 ENCOUNTER — Ambulatory Visit (HOSPITAL_COMMUNITY): Payer: Medicare Other | Admitting: Physician Assistant

## 2020-10-31 DIAGNOSIS — N390 Urinary tract infection, site not specified: Secondary | ICD-10-CM | POA: Diagnosis not present

## 2020-10-31 DIAGNOSIS — K5090 Crohn's disease, unspecified, without complications: Secondary | ICD-10-CM | POA: Diagnosis present

## 2020-10-31 DIAGNOSIS — Y848 Other medical procedures as the cause of abnormal reaction of the patient, or of later complication, without mention of misadventure at the time of the procedure: Secondary | ICD-10-CM | POA: Diagnosis present

## 2020-10-31 DIAGNOSIS — J150 Pneumonia due to Klebsiella pneumoniae: Secondary | ICD-10-CM | POA: Diagnosis not present

## 2020-10-31 DIAGNOSIS — R069 Unspecified abnormalities of breathing: Secondary | ICD-10-CM

## 2020-10-31 DIAGNOSIS — J90 Pleural effusion, not elsewhere classified: Secondary | ICD-10-CM | POA: Diagnosis not present

## 2020-10-31 DIAGNOSIS — C7951 Secondary malignant neoplasm of bone: Secondary | ICD-10-CM | POA: Diagnosis present

## 2020-10-31 DIAGNOSIS — J9602 Acute respiratory failure with hypercapnia: Secondary | ICD-10-CM | POA: Diagnosis not present

## 2020-10-31 DIAGNOSIS — S22000A Wedge compression fracture of unspecified thoracic vertebra, initial encounter for closed fracture: Secondary | ICD-10-CM | POA: Diagnosis present

## 2020-10-31 DIAGNOSIS — F419 Anxiety disorder, unspecified: Secondary | ICD-10-CM | POA: Diagnosis present

## 2020-10-31 DIAGNOSIS — R1310 Dysphagia, unspecified: Secondary | ICD-10-CM | POA: Diagnosis present

## 2020-10-31 DIAGNOSIS — K661 Hemoperitoneum: Secondary | ICD-10-CM | POA: Diagnosis present

## 2020-10-31 DIAGNOSIS — I471 Supraventricular tachycardia: Secondary | ICD-10-CM | POA: Diagnosis not present

## 2020-10-31 DIAGNOSIS — T8119XA Other postprocedural shock, initial encounter: Secondary | ICD-10-CM | POA: Diagnosis present

## 2020-10-31 DIAGNOSIS — R918 Other nonspecific abnormal finding of lung field: Secondary | ICD-10-CM | POA: Diagnosis not present

## 2020-10-31 DIAGNOSIS — Z8249 Family history of ischemic heart disease and other diseases of the circulatory system: Secondary | ICD-10-CM

## 2020-10-31 DIAGNOSIS — I4891 Unspecified atrial fibrillation: Secondary | ICD-10-CM | POA: Diagnosis present

## 2020-10-31 DIAGNOSIS — R338 Other retention of urine: Secondary | ICD-10-CM | POA: Diagnosis present

## 2020-10-31 DIAGNOSIS — Z85850 Personal history of malignant neoplasm of thyroid: Secondary | ICD-10-CM | POA: Diagnosis not present

## 2020-10-31 DIAGNOSIS — N320 Bladder-neck obstruction: Secondary | ICD-10-CM | POA: Diagnosis not present

## 2020-10-31 DIAGNOSIS — J9811 Atelectasis: Secondary | ICD-10-CM | POA: Diagnosis present

## 2020-10-31 DIAGNOSIS — J95851 Ventilator associated pneumonia: Secondary | ICD-10-CM | POA: Diagnosis present

## 2020-10-31 DIAGNOSIS — G9341 Metabolic encephalopathy: Secondary | ICD-10-CM | POA: Diagnosis not present

## 2020-10-31 DIAGNOSIS — Z85828 Personal history of other malignant neoplasm of skin: Secondary | ICD-10-CM

## 2020-10-31 DIAGNOSIS — C61 Malignant neoplasm of prostate: Secondary | ICD-10-CM | POA: Diagnosis present

## 2020-10-31 DIAGNOSIS — J398 Other specified diseases of upper respiratory tract: Secondary | ICD-10-CM | POA: Diagnosis present

## 2020-10-31 DIAGNOSIS — I480 Paroxysmal atrial fibrillation: Secondary | ICD-10-CM | POA: Diagnosis present

## 2020-10-31 DIAGNOSIS — J9622 Acute and chronic respiratory failure with hypercapnia: Secondary | ICD-10-CM | POA: Diagnosis present

## 2020-10-31 DIAGNOSIS — Z902 Acquired absence of lung [part of]: Secondary | ICD-10-CM | POA: Diagnosis not present

## 2020-10-31 DIAGNOSIS — K9421 Gastrostomy hemorrhage: Secondary | ICD-10-CM | POA: Diagnosis not present

## 2020-10-31 DIAGNOSIS — Z7951 Long term (current) use of inhaled steroids: Secondary | ICD-10-CM

## 2020-10-31 DIAGNOSIS — E1129 Type 2 diabetes mellitus with other diabetic kidney complication: Secondary | ICD-10-CM | POA: Diagnosis present

## 2020-10-31 DIAGNOSIS — E860 Dehydration: Secondary | ICD-10-CM | POA: Diagnosis not present

## 2020-10-31 DIAGNOSIS — C73 Malignant neoplasm of thyroid gland: Secondary | ICD-10-CM | POA: Diagnosis present

## 2020-10-31 DIAGNOSIS — I951 Orthostatic hypotension: Secondary | ICD-10-CM | POA: Diagnosis not present

## 2020-10-31 DIAGNOSIS — T17908A Unspecified foreign body in respiratory tract, part unspecified causing other injury, initial encounter: Secondary | ICD-10-CM

## 2020-10-31 DIAGNOSIS — D6832 Hemorrhagic disorder due to extrinsic circulating anticoagulants: Secondary | ICD-10-CM | POA: Diagnosis not present

## 2020-10-31 DIAGNOSIS — E875 Hyperkalemia: Secondary | ICD-10-CM | POA: Diagnosis present

## 2020-10-31 DIAGNOSIS — G4700 Insomnia, unspecified: Secondary | ICD-10-CM | POA: Diagnosis present

## 2020-10-31 DIAGNOSIS — E2740 Unspecified adrenocortical insufficiency: Secondary | ICD-10-CM | POA: Diagnosis present

## 2020-10-31 DIAGNOSIS — Y828 Other medical devices associated with adverse incidents: Secondary | ICD-10-CM | POA: Diagnosis present

## 2020-10-31 DIAGNOSIS — R34 Anuria and oliguria: Secondary | ICD-10-CM | POA: Diagnosis not present

## 2020-10-31 DIAGNOSIS — E1140 Type 2 diabetes mellitus with diabetic neuropathy, unspecified: Secondary | ICD-10-CM | POA: Diagnosis present

## 2020-10-31 DIAGNOSIS — Z930 Tracheostomy status: Secondary | ICD-10-CM | POA: Diagnosis not present

## 2020-10-31 DIAGNOSIS — T83511A Infection and inflammatory reaction due to indwelling urethral catheter, initial encounter: Secondary | ICD-10-CM | POA: Diagnosis not present

## 2020-10-31 DIAGNOSIS — R54 Age-related physical debility: Secondary | ICD-10-CM | POA: Diagnosis present

## 2020-10-31 DIAGNOSIS — R58 Hemorrhage, not elsewhere classified: Secondary | ICD-10-CM | POA: Diagnosis present

## 2020-10-31 DIAGNOSIS — G9349 Other encephalopathy: Secondary | ICD-10-CM | POA: Diagnosis present

## 2020-10-31 DIAGNOSIS — R339 Retention of urine, unspecified: Secondary | ICD-10-CM | POA: Diagnosis present

## 2020-10-31 DIAGNOSIS — E1122 Type 2 diabetes mellitus with diabetic chronic kidney disease: Secondary | ICD-10-CM | POA: Diagnosis not present

## 2020-10-31 DIAGNOSIS — J9621 Acute and chronic respiratory failure with hypoxia: Secondary | ICD-10-CM | POA: Diagnosis not present

## 2020-10-31 DIAGNOSIS — Z87891 Personal history of nicotine dependence: Secondary | ICD-10-CM

## 2020-10-31 DIAGNOSIS — C3492 Malignant neoplasm of unspecified part of left bronchus or lung: Secondary | ICD-10-CM | POA: Diagnosis present

## 2020-10-31 DIAGNOSIS — I4510 Unspecified right bundle-branch block: Secondary | ICD-10-CM | POA: Diagnosis not present

## 2020-10-31 DIAGNOSIS — I10 Essential (primary) hypertension: Secondary | ICD-10-CM | POA: Diagnosis present

## 2020-10-31 DIAGNOSIS — M459 Ankylosing spondylitis of unspecified sites in spine: Secondary | ICD-10-CM | POA: Diagnosis present

## 2020-10-31 DIAGNOSIS — E440 Moderate protein-calorie malnutrition: Secondary | ICD-10-CM | POA: Diagnosis present

## 2020-10-31 DIAGNOSIS — Y838 Other surgical procedures as the cause of abnormal reaction of the patient, or of later complication, without mention of misadventure at the time of the procedure: Secondary | ICD-10-CM | POA: Diagnosis present

## 2020-10-31 DIAGNOSIS — N401 Enlarged prostate with lower urinary tract symptoms: Secondary | ICD-10-CM | POA: Diagnosis present

## 2020-10-31 DIAGNOSIS — Z981 Arthrodesis status: Secondary | ICD-10-CM

## 2020-10-31 DIAGNOSIS — E1165 Type 2 diabetes mellitus with hyperglycemia: Secondary | ICD-10-CM | POA: Diagnosis not present

## 2020-10-31 DIAGNOSIS — E890 Postprocedural hypothyroidism: Secondary | ICD-10-CM | POA: Diagnosis present

## 2020-10-31 DIAGNOSIS — J386 Stenosis of larynx: Secondary | ICD-10-CM | POA: Diagnosis present

## 2020-10-31 DIAGNOSIS — K91840 Postprocedural hemorrhage and hematoma of a digestive system organ or structure following a digestive system procedure: Secondary | ICD-10-CM | POA: Diagnosis present

## 2020-10-31 DIAGNOSIS — S22088D Other fracture of T11-T12 vertebra, subsequent encounter for fracture with routine healing: Secondary | ICD-10-CM | POA: Diagnosis not present

## 2020-10-31 DIAGNOSIS — Z66 Do not resuscitate: Secondary | ICD-10-CM | POA: Diagnosis present

## 2020-10-31 DIAGNOSIS — Z20822 Contact with and (suspected) exposure to covid-19: Secondary | ICD-10-CM | POA: Diagnosis present

## 2020-10-31 DIAGNOSIS — D62 Acute posthemorrhagic anemia: Secondary | ICD-10-CM | POA: Diagnosis present

## 2020-10-31 DIAGNOSIS — Z794 Long term (current) use of insulin: Secondary | ICD-10-CM

## 2020-10-31 DIAGNOSIS — R579 Shock, unspecified: Secondary | ICD-10-CM | POA: Diagnosis present

## 2020-10-31 DIAGNOSIS — M810 Age-related osteoporosis without current pathological fracture: Secondary | ICD-10-CM | POA: Diagnosis present

## 2020-10-31 DIAGNOSIS — Y95 Nosocomial condition: Secondary | ICD-10-CM | POA: Diagnosis not present

## 2020-10-31 DIAGNOSIS — J690 Pneumonitis due to inhalation of food and vomit: Secondary | ICD-10-CM | POA: Diagnosis present

## 2020-10-31 DIAGNOSIS — L409 Psoriasis, unspecified: Secondary | ICD-10-CM | POA: Diagnosis present

## 2020-10-31 DIAGNOSIS — M109 Gout, unspecified: Secondary | ICD-10-CM | POA: Diagnosis present

## 2020-10-31 DIAGNOSIS — Z9911 Dependence on respirator [ventilator] status: Secondary | ICD-10-CM

## 2020-10-31 DIAGNOSIS — Z8546 Personal history of malignant neoplasm of prostate: Secondary | ICD-10-CM

## 2020-10-31 DIAGNOSIS — I959 Hypotension, unspecified: Secondary | ICD-10-CM | POA: Diagnosis present

## 2020-10-31 DIAGNOSIS — I4811 Longstanding persistent atrial fibrillation: Secondary | ICD-10-CM | POA: Diagnosis not present

## 2020-10-31 DIAGNOSIS — E871 Hypo-osmolality and hyponatremia: Secondary | ICD-10-CM | POA: Diagnosis not present

## 2020-10-31 DIAGNOSIS — L8990 Pressure ulcer of unspecified site, unspecified stage: Secondary | ICD-10-CM | POA: Insufficient documentation

## 2020-10-31 DIAGNOSIS — Z515 Encounter for palliative care: Secondary | ICD-10-CM | POA: Diagnosis not present

## 2020-10-31 DIAGNOSIS — N183 Chronic kidney disease, stage 3 unspecified: Secondary | ICD-10-CM | POA: Diagnosis not present

## 2020-10-31 DIAGNOSIS — Z79890 Hormone replacement therapy (postmenopausal): Secondary | ICD-10-CM

## 2020-10-31 DIAGNOSIS — T85528A Displacement of other gastrointestinal prosthetic devices, implants and grafts, initial encounter: Secondary | ICD-10-CM | POA: Diagnosis present

## 2020-10-31 DIAGNOSIS — I9581 Postprocedural hypotension: Secondary | ICD-10-CM | POA: Diagnosis present

## 2020-10-31 DIAGNOSIS — R197 Diarrhea, unspecified: Secondary | ICD-10-CM | POA: Diagnosis not present

## 2020-10-31 DIAGNOSIS — E1142 Type 2 diabetes mellitus with diabetic polyneuropathy: Secondary | ICD-10-CM | POA: Diagnosis not present

## 2020-10-31 DIAGNOSIS — J9601 Acute respiratory failure with hypoxia: Secondary | ICD-10-CM | POA: Diagnosis not present

## 2020-10-31 DIAGNOSIS — F32A Depression, unspecified: Secondary | ICD-10-CM | POA: Diagnosis present

## 2020-10-31 DIAGNOSIS — B964 Proteus (mirabilis) (morganii) as the cause of diseases classified elsewhere: Secondary | ICD-10-CM | POA: Diagnosis present

## 2020-10-31 DIAGNOSIS — C774 Secondary and unspecified malignant neoplasm of inguinal and lower limb lymph nodes: Secondary | ICD-10-CM | POA: Diagnosis present

## 2020-10-31 DIAGNOSIS — I252 Old myocardial infarction: Secondary | ICD-10-CM

## 2020-10-31 DIAGNOSIS — K909 Intestinal malabsorption, unspecified: Secondary | ICD-10-CM | POA: Diagnosis not present

## 2020-10-31 DIAGNOSIS — Z79899 Other long term (current) drug therapy: Secondary | ICD-10-CM

## 2020-10-31 DIAGNOSIS — Z6825 Body mass index (BMI) 25.0-25.9, adult: Secondary | ICD-10-CM

## 2020-10-31 DIAGNOSIS — Z7901 Long term (current) use of anticoagulants: Secondary | ICD-10-CM

## 2020-10-31 HISTORY — PX: GASTROSTOMY: SHX5249

## 2020-10-31 HISTORY — PX: LAPAROTOMY: SHX154

## 2020-10-31 LAB — PROTIME-INR
INR: 1.3 — ABNORMAL HIGH (ref 0.8–1.2)
Prothrombin Time: 15.8 seconds — ABNORMAL HIGH (ref 11.4–15.2)

## 2020-10-31 LAB — BASIC METABOLIC PANEL
Anion gap: 18 — ABNORMAL HIGH (ref 5–15)
BUN: 38 mg/dL — ABNORMAL HIGH (ref 8–23)
CO2: 17 mmol/L — ABNORMAL LOW (ref 22–32)
Calcium: 8.9 mg/dL (ref 8.9–10.3)
Chloride: 91 mmol/L — ABNORMAL LOW (ref 98–111)
Creatinine, Ser: 1.25 mg/dL — ABNORMAL HIGH (ref 0.61–1.24)
GFR, Estimated: 59 mL/min — ABNORMAL LOW (ref >60)
Glucose, Bld: 293 mg/dL — ABNORMAL HIGH (ref 70–99)
Potassium: 5.3 mmol/L — ABNORMAL HIGH (ref 3.5–5.1)
Sodium: 126 mmol/L — ABNORMAL LOW (ref 135–145)

## 2020-10-31 LAB — POCT I-STAT 7, (LYTES, BLD GAS, ICA,H+H)
Acid-Base Excess: 6 mmol/L — ABNORMAL HIGH (ref 0.0–2.0)
Acid-Base Excess: 3 mmol/L — ABNORMAL HIGH (ref 0.0–2.0)
Acid-base deficit: 3 mmol/L — ABNORMAL HIGH (ref 0.0–2.0)
Acid-base deficit: 4 mmol/L — ABNORMAL HIGH (ref 0.0–2.0)
Acid-base deficit: 3 mmol/L — ABNORMAL HIGH (ref 0.0–2.0)
Bicarbonate: 34.5 mmol/L — ABNORMAL HIGH (ref 20.0–28.0)
Bicarbonate: 30.3 mmol/L — ABNORMAL HIGH (ref 20.0–28.0)
Bicarbonate: 23.6 mmol/L (ref 20.0–28.0)
Bicarbonate: 21.3 mmol/L (ref 20.0–28.0)
Bicarbonate: 22.4 mmol/L (ref 20.0–28.0)
Calcium, Ion: 1.17 mmol/L (ref 1.15–1.40)
Calcium, Ion: 1.16 mmol/L (ref 1.15–1.40)
Calcium, Ion: 0.93 mmol/L — ABNORMAL LOW (ref 1.15–1.40)
Calcium, Ion: 1.06 mmol/L — ABNORMAL LOW (ref 1.15–1.40)
Calcium, Ion: 0.88 mmol/L — CL (ref 1.15–1.40)
HCT: 26 % — ABNORMAL LOW (ref 39.0–52.0)
HCT: 21 % — ABNORMAL LOW (ref 39.0–52.0)
HCT: 24 % — ABNORMAL LOW (ref 39.0–52.0)
HCT: 25 % — ABNORMAL LOW (ref 39.0–52.0)
HCT: 27 % — ABNORMAL LOW (ref 39.0–52.0)
Hemoglobin: 8.8 g/dL — ABNORMAL LOW (ref 13.0–17.0)
Hemoglobin: 7.1 g/dL — ABNORMAL LOW (ref 13.0–17.0)
Hemoglobin: 8.2 g/dL — ABNORMAL LOW (ref 13.0–17.0)
Hemoglobin: 8.5 g/dL — ABNORMAL LOW (ref 13.0–17.0)
Hemoglobin: 9.2 g/dL — ABNORMAL LOW (ref 13.0–17.0)
O2 Saturation: 94 %
O2 Saturation: 99 %
O2 Saturation: 100 %
O2 Saturation: 100 %
O2 Saturation: 100 %
Patient temperature: 97
Potassium: 5.8 mmol/L — ABNORMAL HIGH (ref 3.5–5.1)
Potassium: 6.2 mmol/L — ABNORMAL HIGH (ref 3.5–5.1)
Potassium: 6.1 mmol/L — ABNORMAL HIGH (ref 3.5–5.1)
Potassium: 5.5 mmol/L — ABNORMAL HIGH (ref 3.5–5.1)
Potassium: 5.1 mmol/L (ref 3.5–5.1)
Sodium: 125 mmol/L — ABNORMAL LOW (ref 135–145)
Sodium: 124 mmol/L — ABNORMAL LOW (ref 135–145)
Sodium: 124 mmol/L — ABNORMAL LOW (ref 135–145)
Sodium: 124 mmol/L — ABNORMAL LOW (ref 135–145)
Sodium: 127 mmol/L — ABNORMAL LOW (ref 135–145)
TCO2: 37 mmol/L — ABNORMAL HIGH (ref 22–32)
TCO2: 32 mmol/L (ref 22–32)
TCO2: 25 mmol/L (ref 22–32)
TCO2: 22 mmol/L (ref 22–32)
TCO2: 24 mmol/L (ref 22–32)
pCO2 arterial: 74.6 mmHg (ref 32.0–48.0)
pCO2 arterial: 68.1 mmHg (ref 32.0–48.0)
pCO2 arterial: 50.3 mmHg — ABNORMAL HIGH (ref 32.0–48.0)
pCO2 arterial: 38.1 mmHg (ref 32.0–48.0)
pCO2 arterial: 42 mmHg (ref 32.0–48.0)
pH, Arterial: 7.273 — ABNORMAL LOW (ref 7.350–7.450)
pH, Arterial: 7.256 — ABNORMAL LOW (ref 7.350–7.450)
pH, Arterial: 7.275 — ABNORMAL LOW (ref 7.350–7.450)
pH, Arterial: 7.356 (ref 7.350–7.450)
pH, Arterial: 7.335 — ABNORMAL LOW (ref 7.350–7.450)
pO2, Arterial: 85 mmHg (ref 83.0–108.0)
pO2, Arterial: 144 mmHg — ABNORMAL HIGH (ref 83.0–108.0)
pO2, Arterial: 448 mmHg — ABNORMAL HIGH (ref 83.0–108.0)
pO2, Arterial: 413 mmHg — ABNORMAL HIGH (ref 83.0–108.0)
pO2, Arterial: 257 mmHg — ABNORMAL HIGH (ref 83.0–108.0)

## 2020-10-31 LAB — GLUCOSE, CAPILLARY
Glucose-Capillary: 116 mg/dL — ABNORMAL HIGH (ref 70–99)
Glucose-Capillary: 123 mg/dL — ABNORMAL HIGH (ref 70–99)
Glucose-Capillary: 156 mg/dL — ABNORMAL HIGH (ref 70–99)
Glucose-Capillary: 236 mg/dL — ABNORMAL HIGH (ref 70–99)
Glucose-Capillary: 251 mg/dL — ABNORMAL HIGH (ref 70–99)
Glucose-Capillary: 337 mg/dL — ABNORMAL HIGH (ref 70–99)

## 2020-10-31 LAB — CBC
HCT: 29.4 % — ABNORMAL LOW (ref 39.0–52.0)
Hemoglobin: 10 g/dL — ABNORMAL LOW (ref 13.0–17.0)
MCH: 30.9 pg (ref 26.0–34.0)
MCHC: 34 g/dL (ref 30.0–36.0)
MCV: 90.7 fL (ref 80.0–100.0)
Platelets: 187 10*3/uL (ref 150–400)
RBC: 3.24 MIL/uL — ABNORMAL LOW (ref 4.22–5.81)
RDW: 15.3 % (ref 11.5–15.5)
WBC: 45.3 10*3/uL — ABNORMAL HIGH (ref 4.0–10.5)
nRBC: 0 % (ref 0.0–0.2)

## 2020-10-31 LAB — PREPARE RBC (CROSSMATCH)

## 2020-10-31 LAB — SURGICAL PCR SCREEN
MRSA, PCR: NEGATIVE
Staphylococcus aureus: NEGATIVE

## 2020-10-31 LAB — CORTISOL: Cortisol, Plasma: 36.4 ug/dL

## 2020-10-31 LAB — LACTIC ACID, PLASMA
Lactic Acid, Venous: 9.9 mmol/L (ref 0.5–1.9)
Lactic Acid, Venous: 8.9 mmol/L (ref 0.5–1.9)

## 2020-10-31 LAB — TROPONIN I (HIGH SENSITIVITY): Troponin I (High Sensitivity): 31 ng/L — ABNORMAL HIGH (ref <18)

## 2020-10-31 LAB — MAGNESIUM: Magnesium: 1.6 mg/dL — ABNORMAL LOW (ref 1.7–2.4)

## 2020-10-31 LAB — PHOSPHORUS: Phosphorus: 6.5 mg/dL — ABNORMAL HIGH (ref 2.5–4.6)

## 2020-10-31 SURGERY — INSERTION OF GASTROSTOMY TUBE
Anesthesia: General

## 2020-10-31 SURGERY — INSERTION OF GASTROSTOMY TUBE
Anesthesia: Monitor Anesthesia Care | Site: Abdomen

## 2020-10-31 SURGERY — LAPAROTOMY, EXPLORATORY
Anesthesia: General

## 2020-10-31 MED ORDER — BUDESONIDE 0.5 MG/2ML IN SUSP
0.5000 mg | Freq: Two times a day (BID) | RESPIRATORY_TRACT | Status: DC
  Administered 2020-10-31 – 2020-11-20 (×40): 0.5 mg via RESPIRATORY_TRACT
  Filled 2020-10-31 (×41): qty 2

## 2020-10-31 MED ORDER — PHENYLEPHRINE 40 MCG/ML (10ML) SYRINGE FOR IV PUSH (FOR BLOOD PRESSURE SUPPORT)
PREFILLED_SYRINGE | INTRAVENOUS | Status: DC | PRN
  Administered 2020-10-31: 120 ug via INTRAVENOUS
  Administered 2020-10-31: 80 ug via INTRAVENOUS
  Administered 2020-10-31: 120 ug via INTRAVENOUS
  Administered 2020-10-31: 80 ug via INTRAVENOUS
  Administered 2020-10-31: 120 ug via INTRAVENOUS
  Administered 2020-10-31: 80 ug via INTRAVENOUS
  Administered 2020-10-31: 120 ug via INTRAVENOUS

## 2020-10-31 MED ORDER — INSULIN ASPART 100 UNIT/ML IJ SOLN: 10.0000 [IU] | Freq: Once | INTRAMUSCULAR | Status: DC

## 2020-10-31 MED ORDER — CALCIUM GLUCONATE-NACL 1-0.675 GM/50ML-% IV SOLN
1.0000 g | Freq: Once | INTRAVENOUS | Status: AC
  Administered 2020-10-31: 1000 mg via INTRAVENOUS
  Filled 2020-10-31: qty 50

## 2020-10-31 MED ORDER — ALBUMIN HUMAN 5 % IV SOLN
INTRAVENOUS | Status: AC
  Filled 2020-10-31: qty 250

## 2020-10-31 MED ORDER — SODIUM POLYSTYRENE SULFONATE 15 GM/60ML PO SUSP
30.0000 g | Freq: Once | ORAL | Status: AC
  Administered 2020-10-31: 30 g via RECTAL
  Filled 2020-10-31: qty 120

## 2020-10-31 MED ORDER — FENTANYL CITRATE (PF) 100 MCG/2ML IJ SOLN
50.0000 ug | INTRAMUSCULAR | Status: DC | PRN
  Administered 2020-10-31: 50 ug via INTRAVENOUS
  Filled 2020-10-31: qty 2

## 2020-10-31 MED ORDER — SODIUM CHLORIDE 0.9 % IV SOLN
250.0000 mL | INTRAVENOUS | Status: DC
  Administered 2020-11-03: 250 mL via INTRAVENOUS

## 2020-10-31 MED ORDER — FENTANYL CITRATE (PF) 250 MCG/5ML IJ SOLN
INTRAMUSCULAR | Status: DC | PRN
  Administered 2020-10-31: 50 ug via INTRAVENOUS

## 2020-10-31 MED ORDER — IPRATROPIUM-ALBUTEROL 0.5-2.5 (3) MG/3ML IN SOLN: 3.0000 mL | RESPIRATORY_TRACT | Status: DC | PRN

## 2020-10-31 MED ORDER — ORAL CARE MOUTH RINSE
15.0000 mL | OROMUCOSAL | Status: DC
  Administered 2020-10-31 – 2020-11-09 (×68): 15 mL via OROMUCOSAL

## 2020-10-31 MED ORDER — CEFAZOLIN SODIUM 1 G IJ SOLR
INTRAMUSCULAR | Status: AC
  Filled 2020-10-31: qty 20

## 2020-10-31 MED ORDER — HYDROCORTISONE NA SUCCINATE PF 100 MG IJ SOLR: 100.0000 mg | Freq: Once | INTRAMUSCULAR | Status: DC

## 2020-10-31 MED ORDER — FENTANYL CITRATE (PF) 250 MCG/5ML IJ SOLN
INTRAMUSCULAR | Status: AC
  Filled 2020-10-31: qty 5

## 2020-10-31 MED ORDER — LACTATED RINGERS IV SOLN: INTRAVENOUS | Status: DC | PRN

## 2020-10-31 MED ORDER — HYDROCORTISONE NA SUCCINATE PF 100 MG IJ SOLR: 50.0000 mg | Freq: Four times a day (QID) | INTRAMUSCULAR | Status: DC

## 2020-10-31 MED ORDER — NOREPINEPHRINE 4 MG/250ML-% IV SOLN
2.0000 ug/min | INTRAVENOUS | Status: DC
Start: 2020-10-31 — End: 2020-10-31
  Administered 2020-10-31: 5 ug/min via INTRAVENOUS
  Filled 2020-10-31 (×2): qty 250

## 2020-10-31 MED ORDER — NOREPINEPHRINE 4 MG/250ML-% IV SOLN
5.0000 ug/min | INTRAVENOUS | Status: DC
  Administered 2020-10-31: 20 ug/min via INTRAVENOUS
  Administered 2020-10-31: 30 ug/min via INTRAVENOUS

## 2020-10-31 MED ORDER — ALBUMIN HUMAN 5 % IV SOLN: INTRAVENOUS | Status: DC | PRN

## 2020-10-31 MED ORDER — PROMETHAZINE HCL 25 MG/ML IJ SOLN: 6.2500 mg | INTRAMUSCULAR | Status: DC | PRN

## 2020-10-31 MED ORDER — PROPOFOL 10 MG/ML IV BOLUS
INTRAVENOUS | Status: DC | PRN
  Administered 2020-10-31 (×3): 20 mg via INTRAVENOUS

## 2020-10-31 MED ORDER — VASOPRESSIN 20 UNITS/100 ML INFUSION FOR SHOCK
0.0000 [IU]/min | INTRAVENOUS | Status: DC
  Filled 2020-10-31: qty 100

## 2020-10-31 MED ORDER — CHLORHEXIDINE GLUCONATE CLOTH 2 % EX PADS
6.0000 | MEDICATED_PAD | Freq: Two times a day (BID) | CUTANEOUS | Status: DC
  Administered 2020-10-31: 6 via TOPICAL

## 2020-10-31 MED ORDER — ONDANSETRON HCL 4 MG/2ML IJ SOLN
INTRAMUSCULAR | Status: DC | PRN
  Administered 2020-10-31: 4 mg via INTRAVENOUS

## 2020-10-31 MED ORDER — FAMOTIDINE IN NACL 20-0.9 MG/50ML-% IV SOLN
20.0000 mg | Freq: Two times a day (BID) | INTRAVENOUS | Status: DC
  Administered 2020-10-31 – 2020-11-02 (×4): 20 mg via INTRAVENOUS
  Filled 2020-10-31 (×5): qty 50

## 2020-10-31 MED ORDER — LIDOCAINE 2% (20 MG/ML) 5 ML SYRINGE
INTRAMUSCULAR | Status: AC
  Filled 2020-10-31: qty 5

## 2020-10-31 MED ORDER — ACETAMINOPHEN 10 MG/ML IV SOLN: 1000.0000 mg | Freq: Once | INTRAVENOUS | Status: DC | PRN

## 2020-10-31 MED ORDER — ROCURONIUM BROMIDE 10 MG/ML (PF) SYRINGE
PREFILLED_SYRINGE | INTRAVENOUS | Status: AC
  Filled 2020-10-31: qty 20

## 2020-10-31 MED ORDER — MIDAZOLAM HCL 2 MG/2ML IJ SOLN
INTRAMUSCULAR | Status: AC
  Filled 2020-10-31: qty 2

## 2020-10-31 MED ORDER — ONDANSETRON HCL 4 MG/2ML IJ SOLN
INTRAMUSCULAR | Status: AC
  Filled 2020-10-31: qty 2

## 2020-10-31 MED ORDER — PHENYLEPHRINE HCL-NACL 10-0.9 MG/250ML-% IV SOLN
0.0000 ug/min | INTRAVENOUS | Status: DC
  Administered 2020-10-31: 75 ug/min via INTRAVENOUS
  Filled 2020-10-31 (×3): qty 250

## 2020-10-31 MED ORDER — MAGNESIUM SULFATE 2 GM/50ML IV SOLN
2.0000 g | Freq: Once | INTRAVENOUS | Status: AC
  Administered 2020-10-31: 2 g via INTRAVENOUS
  Filled 2020-10-31: qty 50

## 2020-10-31 MED ORDER — SODIUM CHLORIDE 0.9% IV SOLUTION: Freq: Once | INTRAVENOUS | Status: DC

## 2020-10-31 MED ORDER — 0.9 % SODIUM CHLORIDE (POUR BTL) OPTIME
TOPICAL | Status: DC | PRN
  Administered 2020-10-31: 4000 mL

## 2020-10-31 MED ORDER — ROCURONIUM BROMIDE 10 MG/ML (PF) SYRINGE
PREFILLED_SYRINGE | INTRAVENOUS | Status: DC | PRN
  Administered 2020-10-31: 50 mg via INTRAVENOUS
  Administered 2020-10-31: 20 mg via INTRAVENOUS

## 2020-10-31 MED ORDER — NALOXONE HCL 0.4 MG/ML IJ SOLN
INTRAMUSCULAR | Status: AC
  Filled 2020-10-31: qty 2

## 2020-10-31 MED ORDER — POLYETHYLENE GLYCOL 3350 17 G PO PACK
17.0000 g | PACK | Freq: Every day | ORAL | Status: DC
  Administered 2020-11-01 – 2020-11-04 (×4): 17 g
  Filled 2020-10-31 (×4): qty 1

## 2020-10-31 MED ORDER — HYDROCORTISONE NA SUCCINATE PF 100 MG IJ SOLR
100.0000 mg | Freq: Three times a day (TID) | INTRAMUSCULAR | Status: DC
  Administered 2020-10-31 – 2020-11-01 (×2): 100 mg via INTRAVENOUS
  Filled 2020-10-31 (×2): qty 2

## 2020-10-31 MED ORDER — CALCIUM CHLORIDE 10 % IV SOLN
INTRAVENOUS | Status: DC | PRN
  Administered 2020-10-31: 600 mg via INTRAVENOUS
  Administered 2020-10-31 (×2): 200 mg via INTRAVENOUS

## 2020-10-31 MED ORDER — PHENYLEPHRINE HCL-NACL 10-0.9 MG/250ML-% IV SOLN
25.0000 ug/min | INTRAVENOUS | Status: DC
Start: 2020-10-31 — End: 2020-11-01
  Administered 2020-10-31: 200 ug/min via INTRAVENOUS

## 2020-10-31 MED ORDER — 0.9 % SODIUM CHLORIDE (POUR BTL) OPTIME
TOPICAL | Status: DC | PRN
  Administered 2020-10-31: 1000 mL

## 2020-10-31 MED ORDER — EPHEDRINE SULFATE-NACL 50-0.9 MG/10ML-% IV SOSY
PREFILLED_SYRINGE | INTRAVENOUS | Status: DC | PRN
  Administered 2020-10-31 (×3): 10 mg via INTRAVENOUS

## 2020-10-31 MED ORDER — DEXTROSE 50 % IV SOLN: 1.0000 | Freq: Once | INTRAVENOUS | Status: DC

## 2020-10-31 MED ORDER — PROPOFOL 10 MG/ML IV BOLUS
INTRAVENOUS | Status: AC
  Filled 2020-10-31: qty 20

## 2020-10-31 MED ORDER — ALBUMIN HUMAN 25 % IV SOLN: 50.0000 g | Freq: Once | INTRAVENOUS | Status: DC

## 2020-10-31 MED ORDER — NALOXONE HCL 0.4 MG/ML IJ SOLN
INTRAMUSCULAR | Status: AC
  Filled 2020-10-31: qty 1

## 2020-10-31 MED ORDER — SUGAMMADEX SODIUM 200 MG/2ML IV SOLN
INTRAVENOUS | Status: DC | PRN
  Administered 2020-10-31: 200 mg via INTRAVENOUS

## 2020-10-31 MED ORDER — CHLORHEXIDINE GLUCONATE 0.12% ORAL RINSE (MEDLINE KIT)
15.0000 mL | Freq: Two times a day (BID) | OROMUCOSAL | Status: DC
  Administered 2020-10-31 – 2020-11-15 (×29): 15 mL via OROMUCOSAL

## 2020-10-31 MED ORDER — SODIUM CHLORIDE 0.9 % IV SOLN: INTRAVENOUS | Status: DC

## 2020-10-31 MED ORDER — SODIUM CHLORIDE 0.9 % IV BOLUS
1000.0000 mL | Freq: Once | INTRAVENOUS | Status: AC
  Administered 2020-10-31: 1000 mL via INTRAVENOUS

## 2020-10-31 MED ORDER — DOCUSATE SODIUM 50 MG/5ML PO LIQD
100.0000 mg | Freq: Two times a day (BID) | ORAL | Status: DC
  Administered 2020-11-01 – 2020-11-04 (×7): 100 mg
  Filled 2020-10-31 (×10): qty 10

## 2020-10-31 MED ORDER — DEXTROSE 5 % AND 0.9 % NACL IV BOLUS
1000.0000 mL | Freq: Once | INTRAVENOUS | Status: AC
  Administered 2020-10-31: 1000 mL via INTRAVENOUS

## 2020-10-31 MED ORDER — ALBUMIN HUMAN 5 % IV SOLN
12.5000 g | Freq: Once | INTRAVENOUS | Status: AC
  Administered 2020-10-31: 12.5 g via INTRAVENOUS

## 2020-10-31 MED ORDER — CHLORHEXIDINE GLUCONATE CLOTH 2 % EX PADS: 6.0000 | MEDICATED_PAD | Freq: Once | CUTANEOUS | Status: DC

## 2020-10-31 MED ORDER — FENTANYL CITRATE (PF) 100 MCG/2ML IJ SOLN
50.0000 ug | INTRAMUSCULAR | Status: DC | PRN
  Administered 2020-10-31 – 2020-11-01 (×7): 100 ug via INTRAVENOUS
  Administered 2020-11-01: 150 ug via INTRAVENOUS
  Administered 2020-11-01 (×2): 100 ug via INTRAVENOUS
  Administered 2020-11-02 (×3): 200 ug via INTRAVENOUS
  Filled 2020-10-31: qty 2
  Filled 2020-10-31: qty 4
  Filled 2020-10-31: qty 2
  Filled 2020-10-31: qty 4
  Filled 2020-10-31 (×3): qty 2
  Filled 2020-10-31: qty 4
  Filled 2020-10-31: qty 2
  Filled 2020-10-31: qty 4
  Filled 2020-10-31 (×3): qty 2

## 2020-10-31 MED ORDER — FENTANYL CITRATE (PF) 100 MCG/2ML IJ SOLN: 25.0000 ug | INTRAMUSCULAR | Status: DC | PRN

## 2020-10-31 MED ORDER — CALCIUM CHLORIDE 10 % IV SOLN
INTRAVENOUS | Status: AC
  Filled 2020-10-31: qty 10

## 2020-10-31 MED ORDER — INSULIN ASPART 100 UNIT/ML IJ SOLN
0.0000 [IU] | INTRAMUSCULAR | Status: DC
  Administered 2020-10-31: 11 [IU] via SUBCUTANEOUS
  Administered 2020-11-01 (×2): 3 [IU] via SUBCUTANEOUS
  Administered 2020-11-01: 7 [IU] via SUBCUTANEOUS
  Administered 2020-11-01 – 2020-11-02 (×3): 3 [IU] via SUBCUTANEOUS
  Administered 2020-11-03: 4 [IU] via SUBCUTANEOUS
  Administered 2020-11-03 – 2020-11-04 (×9): 3 [IU] via SUBCUTANEOUS
  Administered 2020-11-05: 7 [IU] via SUBCUTANEOUS
  Administered 2020-11-05 (×2): 4 [IU] via SUBCUTANEOUS
  Administered 2020-11-05 – 2020-11-07 (×7): 3 [IU] via SUBCUTANEOUS
  Administered 2020-11-07: 4 [IU] via SUBCUTANEOUS
  Administered 2020-11-07: 3 [IU] via SUBCUTANEOUS
  Administered 2020-11-07: 4 [IU] via SUBCUTANEOUS
  Administered 2020-11-07 – 2020-11-08 (×3): 3 [IU] via SUBCUTANEOUS
  Administered 2020-11-08 (×2): 4 [IU] via SUBCUTANEOUS
  Administered 2020-11-08: 3 [IU] via SUBCUTANEOUS
  Administered 2020-11-09: 4 [IU] via SUBCUTANEOUS
  Administered 2020-11-09 (×2): 3 [IU] via SUBCUTANEOUS
  Administered 2020-11-09: 4 [IU] via SUBCUTANEOUS
  Administered 2020-11-09 (×2): 3 [IU] via SUBCUTANEOUS
  Administered 2020-11-10 (×2): 4 [IU] via SUBCUTANEOUS
  Administered 2020-11-10: 3 [IU] via SUBCUTANEOUS
  Administered 2020-11-10 (×3): 4 [IU] via SUBCUTANEOUS
  Administered 2020-11-11 (×2): 3 [IU] via SUBCUTANEOUS
  Administered 2020-11-11 (×2): 4 [IU] via SUBCUTANEOUS
  Administered 2020-11-11 – 2020-11-13 (×8): 3 [IU] via SUBCUTANEOUS
  Administered 2020-11-13: 4 [IU] via SUBCUTANEOUS
  Administered 2020-11-13: 7 [IU] via SUBCUTANEOUS
  Administered 2020-11-13: 3 [IU] via SUBCUTANEOUS
  Administered 2020-11-14: 7 [IU] via SUBCUTANEOUS
  Administered 2020-11-14: 4 [IU] via SUBCUTANEOUS
  Administered 2020-11-14 (×2): 3 [IU] via SUBCUTANEOUS
  Administered 2020-11-15 (×4): 4 [IU] via SUBCUTANEOUS

## 2020-10-31 MED ORDER — PHENYLEPHRINE 40 MCG/ML (10ML) SYRINGE FOR IV PUSH (FOR BLOOD PRESSURE SUPPORT)
PREFILLED_SYRINGE | INTRAVENOUS | Status: AC
  Filled 2020-10-31: qty 20

## 2020-10-31 MED ORDER — PANTOPRAZOLE SODIUM 40 MG IV SOLR: 40.0000 mg | Freq: Every day | INTRAVENOUS | Status: DC

## 2020-10-31 MED ORDER — NALOXONE HCL 0.4 MG/ML IJ SOLN
0.4000 mg | INTRAMUSCULAR | Status: DC | PRN
  Administered 2020-10-31: 0.4 mg via INTRAVENOUS

## 2020-10-31 MED ORDER — LACTATED RINGERS IV BOLUS: 1000.0000 mL | Freq: Once | INTRAVENOUS | Status: DC

## 2020-10-31 MED ORDER — DEXAMETHASONE SODIUM PHOSPHATE 10 MG/ML IJ SOLN
INTRAMUSCULAR | Status: DC | PRN
  Administered 2020-10-31: 5 mg via INTRAVENOUS

## 2020-10-31 MED ORDER — DEXAMETHASONE SODIUM PHOSPHATE 10 MG/ML IJ SOLN
INTRAMUSCULAR | Status: AC
  Filled 2020-10-31: qty 1

## 2020-10-31 MED ORDER — LIDOCAINE HCL (PF) 0.5 % IJ SOLN
INTRAMUSCULAR | Status: AC
  Filled 2020-10-31: qty 50

## 2020-10-31 SURGICAL SUPPLY — 45 items
APL PRP STRL LF DISP 70% ISPRP (MISCELLANEOUS) ×1
BLADE CLIPPER SURG (BLADE) IMPLANT
CANISTER SUCT 3000ML PPV (MISCELLANEOUS) ×2 IMPLANT
CHLORAPREP W/TINT 26 (MISCELLANEOUS) ×2 IMPLANT
COVER SURGICAL LIGHT HANDLE (MISCELLANEOUS) ×2 IMPLANT
COVER WAND RF STERILE (DRAPES) ×2 IMPLANT
DRAPE LAPAROSCOPIC ABDOMINAL (DRAPES) ×2 IMPLANT
DRAPE WARM FLUID 44X44 (DRAPES) ×2 IMPLANT
DRSG OPSITE 11X17.75 LRG (GAUZE/BANDAGES/DRESSINGS) ×1 IMPLANT
DRSG OPSITE POSTOP 4X10 (GAUZE/BANDAGES/DRESSINGS) ×1 IMPLANT
DRSG OPSITE POSTOP 4X8 (GAUZE/BANDAGES/DRESSINGS) IMPLANT
ELECT BLADE 6.5 EXT (BLADE) IMPLANT
ELECT CAUTERY BLADE 6.4 (BLADE) IMPLANT
ELECT REM PT RETURN 9FT ADLT (ELECTROSURGICAL) ×2
ELECTRODE REM PT RTRN 9FT ADLT (ELECTROSURGICAL) ×1 IMPLANT
GLOVE BIO SURGEON STRL SZ7 (GLOVE) ×2 IMPLANT
GLOVE BIOGEL PI IND STRL 7.5 (GLOVE) ×1 IMPLANT
GLOVE BIOGEL PI INDICATOR 7.5 (GLOVE) ×1
GOWN STRL REUS W/ TWL LRG LVL3 (GOWN DISPOSABLE) ×2 IMPLANT
GOWN STRL REUS W/TWL LRG LVL3 (GOWN DISPOSABLE) ×4
HANDLE SUCTION POOLE (INSTRUMENTS) ×1 IMPLANT
KIT BASIN OR (CUSTOM PROCEDURE TRAY) ×2 IMPLANT
KIT TURNOVER KIT B (KITS) ×2 IMPLANT
LIGASURE IMPACT 36 18CM CVD LR (INSTRUMENTS) IMPLANT
NS IRRIG 1000ML POUR BTL (IV SOLUTION) ×4 IMPLANT
PACK GENERAL/GYN (CUSTOM PROCEDURE TRAY) ×2 IMPLANT
PAD ARMBOARD 7.5X6 YLW CONV (MISCELLANEOUS) ×2 IMPLANT
PENCIL SMOKE EVACUATOR (MISCELLANEOUS) ×2 IMPLANT
SPECIMEN JAR LARGE (MISCELLANEOUS) IMPLANT
SPONGE LAP 18X18 RF (DISPOSABLE) IMPLANT
STAPLER VISISTAT 35W (STAPLE) ×2 IMPLANT
SUCTION POOLE HANDLE (INSTRUMENTS) ×2
SUT NOVA 1 T20/GS 25DT (SUTURE) ×4 IMPLANT
SUT PDS AB 1 TP1 96 (SUTURE) ×4 IMPLANT
SUT SILK 2 0 (SUTURE) ×2
SUT SILK 2 0 SH CR/8 (SUTURE) ×2 IMPLANT
SUT SILK 2-0 18XBRD TIE 12 (SUTURE) ×1 IMPLANT
SUT SILK 3 0 (SUTURE) ×2
SUT SILK 3 0 SH CR/8 (SUTURE) ×2 IMPLANT
SUT SILK 3-0 18XBRD TIE 12 (SUTURE) ×1 IMPLANT
SUT VIC AB 3-0 SH 27 (SUTURE)
SUT VIC AB 3-0 SH 27X BRD (SUTURE) IMPLANT
TOWEL GREEN STERILE (TOWEL DISPOSABLE) ×2 IMPLANT
TRAY FOLEY MTR SLVR 16FR STAT (SET/KITS/TRAYS/PACK) ×2 IMPLANT
YANKAUER SUCT BULB TIP NO VENT (SUCTIONS) IMPLANT

## 2020-10-31 SURGICAL SUPPLY — 39 items
APL PRP STRL LF DISP 70% ISPRP (MISCELLANEOUS) ×1
BLADE CLIPPER SURG (BLADE) IMPLANT
BUTTON W/BALLN 14FR 1.7 (GASTROSTOMY BUTTON) IMPLANT
CANISTER SUCT 3000ML PPV (MISCELLANEOUS) ×2 IMPLANT
CATH GASTROSTOMY 20FR (CATHETERS) IMPLANT
CHLORAPREP W/TINT 26 (MISCELLANEOUS) ×2 IMPLANT
COVER SURGICAL LIGHT HANDLE (MISCELLANEOUS) ×2 IMPLANT
COVER WAND RF STERILE (DRAPES) ×1 IMPLANT
DRAPE LAPAROSCOPIC ABDOMINAL (DRAPES) ×2 IMPLANT
DRSG OPSITE POSTOP 4X6 (GAUZE/BANDAGES/DRESSINGS) ×1 IMPLANT
ELECT CAUTERY BLADE 6.4 (BLADE) ×2 IMPLANT
ELECT REM PT RETURN 9FT ADLT (ELECTROSURGICAL) ×2
ELECTRODE REM PT RTRN 9FT ADLT (ELECTROSURGICAL) ×1 IMPLANT
GAUZE SPONGE 4X4 12PLY STRL (GAUZE/BANDAGES/DRESSINGS) ×2 IMPLANT
GLOVE BIO SURGEON STRL SZ7 (GLOVE) ×4 IMPLANT
GLOVE BIOGEL PI IND STRL 7.5 (GLOVE) ×1 IMPLANT
GLOVE BIOGEL PI INDICATOR 7.5 (GLOVE) ×1
GOWN STRL REUS W/ TWL LRG LVL3 (GOWN DISPOSABLE) ×2 IMPLANT
GOWN STRL REUS W/TWL LRG LVL3 (GOWN DISPOSABLE) ×4
KIT BASIN OR (CUSTOM PROCEDURE TRAY) ×2 IMPLANT
KIT TURNOVER KIT B (KITS) ×2 IMPLANT
NS IRRIG 1000ML POUR BTL (IV SOLUTION) ×4 IMPLANT
PACK GENERAL/GYN (CUSTOM PROCEDURE TRAY) ×2 IMPLANT
PAD ARMBOARD 7.5X6 YLW CONV (MISCELLANEOUS) ×2 IMPLANT
PENCIL SMOKE EVACUATOR (MISCELLANEOUS) ×2 IMPLANT
SUT ETHILON 2 0 FS 18 (SUTURE) ×2 IMPLANT
SUT ETHILON 3 0 FSL (SUTURE) ×4 IMPLANT
SUT MNCRL AB 4-0 PS2 18 (SUTURE) ×2 IMPLANT
SUT NOVA NAB DX-16 0-1 5-0 T12 (SUTURE) ×1 IMPLANT
SUT PDS AB 1 CT  36 (SUTURE) ×4
SUT PDS AB 1 CT 36 (SUTURE) ×2 IMPLANT
SUT SILK 2 0 SH CR/8 (SUTURE) ×3 IMPLANT
SYR CONTROL 10ML LL (SYRINGE) ×2 IMPLANT
TOWEL GREEN STERILE (TOWEL DISPOSABLE) ×2 IMPLANT
TOWEL GREEN STERILE FF (TOWEL DISPOSABLE) ×2 IMPLANT
TRAY FOLEY MTR SLVR 16FR STAT (SET/KITS/TRAYS/PACK) ×2 IMPLANT
TUBE GASTROSTOMY 18F (CATHETERS) IMPLANT
TUBE MIC GASTROSTOMY 16FR (TUBING) ×1 IMPLANT
TUBE TRACH 6 EXL DIST CUF (TUBING) ×1 IMPLANT

## 2020-10-31 NOTE — Progress Notes (Signed)
Patient transported from PACU to room 7A15 without complications.

## 2020-10-31 NOTE — Progress Notes (Signed)
   Subjective/Chief Complaint: No change this am, tube feeds and heparin off   Objective: Vital signs in last 24 hours: Temp:  [97.9 F (36.6 C)-98.5 F (36.9 C)] 98.4 F (36.9 C) (05/24 0422) Pulse Rate:  [55-63] 55 (05/24 0422) Resp:  [17-20] 20 (05/24 0422) BP: (106-134)/(53-62) 134/57 (05/24 0422) SpO2:  [68 %-100 %] 95 % (05/24 0422) Weight:  [90 kg] 90 kg (05/24 0500) Last BM Date: 10/29/20  Intake/Output from previous day: No intake/output data recorded. Intake/Output this shift: No intake/output data recorded.  GI: soft nt/nd  Lab Results:  Recent Labs    10/29/20 0632 10/30/20 0617  WBC 8.5 12.3*  HGB 9.6* 9.7*  HCT 31.1* 31.8*  PLT 251 266   BMET Recent Labs    10/30/20 0617  NA 129*  K 4.8  CL 85*  CO2 39*  GLUCOSE 126*  BUN 40*  CREATININE 0.99  CALCIUM 9.3   PT/INR No results for input(s): LABPROT, INR in the last 72 hours. ABG No results for input(s): PHART, HCO3 in the last 72 hours.  Invalid input(s): PCO2, PO2  Studies/Results: No results found.  Anti-infectives: Anti-infectives (From admission, onward)   Start     Dose/Rate Route Frequency Ordered Stop   10/31/20 0600  ceFAZolin (ANCEF) IVPB 2g/100 mL premix        2 g 200 mL/hr over 30 Minutes Intravenous On call to O.R. 10/30/20 1013 11/01/20 0559   10/13/20 0100  piperacillin-tazobactam (ZOSYN) IVPB 3.375 g        3.375 g 12.5 mL/hr over 240 Minutes Intravenous Every 8 hours 10/12/20 1911 10/13/20 0511      Assessment/Plan: Dysphagia -Plan for OR today for open g-tube placement, discussed yesterday placement with risks which are not insignificant given prior surgery FEN - NPO p MN VTE - heparin gtt ID - ancef on call to [place] 10/31/2020

## 2020-10-31 NOTE — Progress Notes (Signed)
Patient ID: Devin [name], male   DOB: 1940/06/18, 80 y.o.   MRN: [id number] Hypotensive postop, had fair amt fluid intraop and elevated co2.  Due to hypotension received prbcs and hb fell to 7.7. after two units hb 8.2.  I discussed with family returning to OR asap for likely bleeding to fix this.  Will proceed asap

## 2020-10-31 NOTE — H&P (Signed)
See progress note from PCCM team today

## 2020-10-31 NOTE — Progress Notes (Addendum)
Patient arrived from PACU just before 1600. On arrival, patient was stable and alert. Pt just finished receiving one unit PRBCs and was on 55m of Levophed. Dr. WDonne Hazel Dr. MLake Bells and KFerdinand Cava PA were at bedside when patient became hypotensive with arterial line BPs 60/30s. Second unit of blood emergently given via pressure bag and blood verified at bedside with this RN and NDaleen Bo RN. PRBC unit # [id number] Levophed was increased to 433m Blood pressure recovered temporarily to systolic 11638Tbut patients BP quickly dropped again once blood finished infusing. Bedside Hgb showed 8.2 and Dr. WaDonne Hazelade aware. MD made decision to take patient back to OR and for 2U PRBC and 2u FFP to be given prior to OR return. Neo started as blood pressure did not respond to Levophed. OR team quickly arrived at bedside and the blood products were to be sent to OR from blood bank. RN confirmed with OR staff would retrieve blood products from blood bank. Pt had one episode of brown emesis during this time. He remained alert and responsive. No family arrived at bedside but Dr. WaDonne Hazelpoke to family and updated.

## 2020-10-31 NOTE — Progress Notes (Addendum)
NAME:  ZAMERE PASTERNAK, MRN:  [id number], DOB:  16-Sep-1940, LOS: 0 ADMISSION DATE:  10/31/2020, CONSULTATION DATE:  5/3 REFERRING MD:  Verlon Au, CHIEF COMPLAINT:  Dyspnea   History of Present Illness:  80 y/o had a fall and spinal fracture with post admission course complicated by prolonged mechanical ventilation and 2 extubation failures, ultimately requiring a tracheostomy.  There was concern for supra and sub-glottic airway narrowing.  XL tracheostomy ultimately placed on 5/3.  Moved to Rehab and has been working with them there.  On 5/24 [name] underwent PEG tube placement in the OR with Dr. Donne Hazel without incident. PCCM was called to PACU for admission for post-op hypotension. ABG 7.25/68/144/30. HGB on iStat 6.2. HGB post op 7.1. On 195 phenylephrine. PCCM to admit.  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 Was able to bypass with distal XLT. 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 Voice quality better. Feeling stronger   5/16 foley placed by urology. working with OT, talking   5/19 capping trials with tracheostomy  5/24 PEG placement in OR. PCCM reconsulted on for hypotension. 6.0 XLTD cuffless swithced to 6.0 XLTD cuffed. CVC>  Interim History / Subjective:  See above  Unable to obtain subjective evaluation due to patient  status  Objective   There were no vitals taken for this visit.       No intake or output data in the 24 hours ending 10/31/20 1504 There were no vitals filed for this visit.  Examination: General:  Ill appearing, in bed, frail HEENT: MMmoist, anicteric, trachea midline, trach in place, no drainage Neuro: localizes, eyes open to voice, RASS -1, PERRL 69m CV: S1S2, NSR on monitor, no m/r/g appreciated PULM:  Air movement in all lobes, chest expansion symmetric, tachypnic  GI: soft, bsx4 hypoactive, new midline wound, new peg placement, blood around site   Extremities: warm/dry, no pretibal edema, capillary refill less than 3 seconds  Skin: no rashes or lesions appreciated  Labs/imaging that I havepersonally reviewed  (right click and "Reselect all SmartList Selections" daily)  ABG 7.25/68/144/30. K on iStat 6.2. HGB post op 7.1.  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 P -Cuffless Trach switched to cuffed trach for mechanical ventilation. -LTVV strategy with tidal volumes of 4-8 cc/kg ideal body weight -Goal plateau pressures of 30 and driving pressures of 15 -Wean PEEP/FiO2 for SpO2 greater than 90 -Follow intermittent CXR and ABG PRN. CXR post trach change -PRN duo nebs, will consider restarting home pulmicort.  -VAP bundle   Unspecified Shock requiring Vasopressors ?hypovolemic shock vs Adrenal insufficiency vs oversedation. HGB 7.1. On synthroid outpatient P -1MG narcan ordered -initate NE and and Phenylephrine. -Starting stress dose steroids. 1037msolucortef now, then 10048m8H -Plan to place CVC -1L LR bolus -Giving 2U prbc empirically in  the setting of surgery -Follow up CBC -Transfuse PRBC if HBG less than 7 -Obtain AM CBC to trend H&H  Hyperkalemia K 6.2 on  iStat P -Temporize with 10U insulin, amp of D50, and calcium gluconate -Follow up K level on bmp -If K remains elevated will give lokelma per cortrak -Stat 12 lead  New PEG Placement S/P placment in OR on 5/24 by Donne Hazel MD P -MGMT per surgery  -Holding bowel regimen at this time  DM2 p -Start SSI -Blood Glucose goal 140-180.  Hx Afib Currently hypotensive and post surgery. Was previously on apixiban/hep gtt.  p -Holding AC at this time due to possible active bleed -Holding home cardezem and atenolol at this time   Best practice (right click and "Reselect all SmartList Selections" daily)  Diet:  NPA Pain/Anxiety/Delirium protocol (if indicated): N/A VAP protocol (if indicated): Yes DVT prophylaxis: SCD GI prophylaxis: PPI Glucose control:  SSI yes Central venous access:  Needs Arterial line:  Yes, and needs Foley:  Yes, and needs Mobility:  bedrest PT consulted: n/a Last date of multidisciplinary goals of care discussion [pending] Code Status:  Full Disposition: ICU   Critical care time: [place] Lamerle Jabs, Jr., MSN, APRN, AGACNP-BC [facility]  10/31/2020 , 3:04 PM  Please see [email or web address] for pager details  If no response, please call [phone] After hours, please call Elink at [phone]

## 2020-10-31 NOTE — Anesthesia Preprocedure Evaluation (Addendum)
Anesthesia Evaluation  Patient identified by MRN, date of birth, ID band Patient unresponsive    Reviewed: Allergy & Precautions, H&P , NPO status , Patient's Chart, lab work & pertinent test results  Airway Mallampati: Trach       Dental no notable dental hx. (+) Teeth Intact   Pulmonary COPD, former smoker,    Pulmonary exam normal breath sounds clear to auscultation       Cardiovascular hypertension, Pt. on medications + Past MI  + dysrhythmias Atrial Fibrillation  Rhythm:Regular Rate:Normal     Neuro/Psych negative neurological ROS  negative psych ROS   GI/Hepatic negative GI ROS, Neg liver ROS,   Endo/Other  diabetes, Insulin DependentHypothyroidism   Renal/GU negative Renal ROS  negative genitourinary   Musculoskeletal  (+) Arthritis , Osteoarthritis,    Abdominal   Peds  Hematology  (+) Blood dyscrasia, anemia ,   Anesthesia Other Findings   Reproductive/Obstetrics negative OB ROS                           Anesthesia Physical Anesthesia Plan  ASA: IV and emergent  Anesthesia Plan: General   Post-op Pain Management:    Induction: Intravenous  PONV Risk Score and Plan: 2 and Treatment may vary due to age or medical condition  Airway Management Planned: Tracheostomy  Additional Equipment:   Intra-op Plan:   Post-operative Plan: Post-operative intubation/ventilation  Informed Consent: I have reviewed the patients History and Physical, chart, labs and discussed the procedure including the risks, benefits and alternatives for the proposed anesthesia with the patient or authorized representative who has indicated his/her understanding and acceptance.     Only emergency history available and History available from chart only  Plan Discussed with: CRNA  Anesthesia Plan Comments:         Anesthesia Quick Evaluation

## 2020-10-31 NOTE — Addendum Note (Signed)
Addendum  created 10/31/20 1501 by Inda Coke, CRNA   Charge Capture section accepted, Child order released for a procedure order, Clinical Note Signed, Intraprocedure Blocks edited, LDA created via procedure documentation, LDA properties accepted

## 2020-10-31 NOTE — Progress Notes (Signed)
Dr. Ola Spurr notified patient's BP 84/44 (57), currently infusing Neo at 195 mcg/min. Dr. Ola Spurr at bedside, critical care consulted. Decision was made to insert right radial A-line, inserted at bedside by CRNA. Levo started at 76mg/ min, Neo weaned off, Narcan 0.4 mg administered, uncuffed trach changed out to cuffed/ place patient on vent, and insert central line, order to transfuse 2 units of blood ordered.   During PACU, patient received 2L LR, Ablumin x2, 1 unit of PRBC.   Patient's BP stable at time of transfer to 69M. Report given to LLattie Haw RN at bedside.

## 2020-10-31 NOTE — Progress Notes (Signed)
120 mcg Neo push administered by Dr. Daiva Huge at bedside in PACU.

## 2020-10-31 NOTE — Transfer of Care (Signed)
Immediate Anesthesia Transfer of Care Note  Patient: Devin [name]  Procedure(s) Performed: INSERTION OF GASTROSTOMY TUBE (N/A Abdomen)  Patient Location: PACU  Anesthesia Type:General  Level of Consciousness: awake and alert   Airway & Oxygen Therapy: Patient Spontanous Breathing and Patient connected to tracheostomy mask oxygen  Post-op Assessment: Report given to RN and Post -op Vital signs reviewed and stable  Post vital signs: Reviewed and stable  Last Vitals:  Vitals Value Taken Time  BP 118/46 10/31/20 1144  Temp    Pulse 69 10/31/20 1150  Resp 22 10/31/20 1150  SpO2 98 % 10/31/20 1150  Vitals shown include unvalidated device data.  Last Pain:  Vitals:   10/31/20 0730  TempSrc:   PainSc: 0-No pain      Patients Stated Pain Goal: 2 (73/74/96 [id number])  Complications: No complications documented.

## 2020-10-31 NOTE — Anesthesia Preprocedure Evaluation (Addendum)
Anesthesia Evaluation  Patient identified by MRN, date of birth, ID band Patient awake    Reviewed: Allergy & Precautions, NPO status , Patient's Chart, lab work & pertinent test results, reviewed documented beta blocker date and time   History of Anesthesia Complications Negative for: history of anesthetic complications  Airway Mallampati: Trach   Neck ROM: Full    Dental no notable dental hx.    Pulmonary COPD, former smoker,    Pulmonary exam normal        Cardiovascular hypertension, Pt. on medications and Pt. on home beta blockers + Past MI  Normal cardiovascular exam+ dysrhythmias (on Eliquis) Atrial Fibrillation   TTE 12/2019: EF 60-65%, mild LVH, grade I DD, mild MR    Neuro/Psych negative neurological ROS  negative psych ROS   GI/Hepatic negative GI ROS, Neg liver ROS, Crohn's disease   Endo/Other  diabetes, Type 2, Insulin DependentHypothyroidism   Renal/GU negative Renal ROS  negative genitourinary   Musculoskeletal  (+) Arthritis , Ankylosing spondylitis    Abdominal   Peds  Hematology  (+) anemia , Hgb 9.7   Anesthesia Other Findings Day of surgery medications reviewed with patient.  Reproductive/Obstetrics negative OB ROS                            Anesthesia Physical Anesthesia Plan  ASA: III  Anesthesia Plan: General   Post-op Pain Management:    Induction: Inhalational  PONV Risk Score and Plan: 2 and Treatment may vary due to age or medical condition and Propofol infusion  Airway Management Planned: Tracheostomy  Additional Equipment: None  Intra-op Plan:   Post-operative Plan: Post-operative intubation/ventilation  Informed Consent: I have reviewed the patients History and Physical, chart, labs and discussed the procedure including the risks, benefits and alternatives for the proposed anesthesia with the patient or authorized representative who has  indicated his/her understanding and acceptance.     Consent reviewed with POA  Plan Discussed with: CRNA  Anesthesia Plan Comments:        Anesthesia Quick Evaluation

## 2020-10-31 NOTE — Progress Notes (Signed)
Patient ID: Devin [name], male   DOB: July 24, 1940, 81 y.o.   MRN: [id number]  Per EMR, pt to get PEG placed today.  SW sent HHPT/OT/SLP/SN (trach and TF edu)/aide to [facility] and waiting on follow-up.   *SW informed by medical team pt will d/c off floor. SW saw pt wife and daughter and informed will note account that was still working on [facility].   Loralee Pacas, MSW, [place] Office: [phone] Cell: [phone] Fax: [phone]

## 2020-10-31 NOTE — Procedures (Signed)
Tracheostomy Exchange Procedure Note  MERTON WADLOW  [id number]  1940/07/04  Date:10/31/20  Time:3:37 PM   Provider Performing:Brent Zlata Alcaide   Procedure: Tracheostomy Exchange Through Immature Stoma [phone])  Indication(s) Acute respiratory failure with hypercarbia  Consent Unable to obtain consent due to emergent nature of procedure.  Anesthesia None   Time Out Verified patient identification, verified procedure, site/side was marked, verified correct patient position, special equipment/implants available, medications/allergies/relevant history reviewed, required imaging and test results available.   Sterile Technique Hand hygiene, gloves   Procedure Description Size #7 XLT uncuffed existing Shiley removed and size #7 XLT cuffed Shiley placed through stoma.   Complications/Tolerance None; patient tolerated the procedure well..   EBL Minimal   Roselie Awkward, MD [place] [facility] Pager: [phone] Cell: [phone] If no response, please call [phone] until 7pm After 7:00 pm call Elink  [phone]

## 2020-10-31 NOTE — Progress Notes (Signed)
eLink Physician-Brief Progress Note Patient Name: Devin [name] DOB: [birth year] MRN: [id number]   Date of Service  10/31/2020  HPI/Events of Note  Multiple issues: 1. Lactic Acid = 9.9, 2. Hyperkalemia - K+ = 5.2 and Creatinine = 1.25, 3. Hypomagnesemia - Mg++ = 1.6 and 4. Nursing request for AM labs and CXR. Hgb = 10.0 Last LVEF = 60-65%.  eICU Interventions  Plan: 1. D/C LR bolus. 2. 0.9 NaCl 1 liter IV over 1 hour now. 3. 0.9 NaCl IV infusion to run IV at 100 mL/hour. 4. Kayexalate enema 30 gm per rectum now. 5. Replace Mg++. 6. CBC with platelets, BMP and Mg++ level in AM. 7. Portable CXR in AM.     Intervention Category Major Interventions: Acid-Base disturbance - evaluation and management;Electrolyte abnormality - evaluation and management  Devin [name] 10/31/2020, 8:37 PM

## 2020-10-31 NOTE — Anesthesia Procedure Notes (Signed)
Arterial Line Insertion Start/End5/24/2022 2:25 PM, 10/31/2020 2:30 PM Performed by: Inda Coke, CRNA, CRNA  Preanesthetic checklist: patient identified, IV checked, site marked, risks and benefits discussed, surgical consent, monitors and equipment checked, pre-op evaluation, timeout performed and anesthesia consent Patient sedated Right, radial was placed Catheter size: 20 G Hand hygiene performed  and maximum sterile barriers used  Allen's test indicative of satisfactory collateral circulation Attempts: 2 Procedure performed without using ultrasound guided technique. Following insertion, dressing applied and Biopatch. Post procedure assessment: normal  Patient tolerated the procedure well with no immediate complications.

## 2020-10-31 NOTE — Anesthesia Postprocedure Evaluation (Signed)
Anesthesia Post Note  Patient: Devin [name]  Procedure(s) Performed: EXPLORATORY LAPAROTOMY WITH [place] (N/A )     Patient location during evaluation: SICU Anesthesia Type: General Level of consciousness: sedated Pain management: pain level controlled Vital Signs Assessment: vitals unstable Respiratory status: patient remains intubated per anesthesia plan and patient on ventilator - see flowsheet for VS Cardiovascular status: unstable Postop Assessment: no apparent nausea or vomiting Anesthetic complications: no   No complications documented.  Last Vitals:  Vitals:   10/31/20 1600 10/31/20 1753  BP: (!) 62/28   Pulse: 65   Resp: (!) 23   Temp:    SpO2: 100% 100%    Last Pain:  Vitals:                 Samayah Novinger,W. EDMOND

## 2020-10-31 NOTE — Progress Notes (Signed)
eLink Physician-Brief Progress Note Patient Name: Devin [name] DOB: Mar 06, 1941 MRN: [id number]   Date of Service  10/31/2020  HPI/Events of Note  Lactic Acid = 9.9 --> 8.9 post 1 liter 0.9 NaCl bolus.  eICU Interventions  Plan: 1. Bolus with 0.9 NaCl 1 liter IV over 1 hour now. 2. Continue to trend Lactic Acid level.      Intervention Category Major Interventions: Acid-Base disturbance - evaluation and management  Sudie Bandel Cornelia Copa 10/31/2020, 10:57 PM

## 2020-10-31 NOTE — Procedures (Signed)
Tracheostomy Change Note  Patient Details:   Name: BONNIE ROIG DOB: 11/04/1940 MRN: [id number]    Airway Documentation:     Evaluation  O2 sats: stable throughout Complications: No apparent complications Patient did tolerate procedure well. Bilateral Breath Sounds: Rhonchi     Patient trach changed from #6 Shiley XLT cuffless to #6 Shiley XLT distal trach by this RT and MD.  Suction catheter used for tube exchange.  Positive color change noted.  Bilateral breath sounds auscultated.  Patient then placed on ventilator and is tolerating well.  Will continue to monitor.   Judith Part 10/31/2020, 3:08 PM

## 2020-10-31 NOTE — Anesthesia Postprocedure Evaluation (Signed)
Anesthesia Post Note  Patient: Devin [name]  Procedure(s) Performed: INSERTION OF GASTROSTOMY TUBE (N/A Abdomen)     Patient location during evaluation: PACU Anesthesia Type: General Level of consciousness: responds to stimulation Pain management: pain level controlled Vital Signs Assessment: post-procedure vital signs reviewed and stable Respiratory status: spontaneous breathing, nonlabored ventilation, respiratory function stable and patient connected to tracheostomy mask oxygen Cardiovascular status: stable Postop Assessment: no apparent nausea or vomiting Anesthetic complications: no Comments: Patient with prolonged emergence in PACU. Improved over course of PACU stay, patient now opens eyes, nods to respond to questions, moving all extremities on command, but very sleepy. Hypotensive requiring phenylephrine gtt, fluid boluses given with minimal improvement. ABG drawn with notable respiratory acidosis and stable anemia. Patient to be admitted to ICU for ongoing care. I have personally communicated the plan with ICU staff as well as surgical team. Daiva Huge, MD   No complications documented.  Last Vitals:  Vitals:   10/31/20 1325 10/31/20 1330  BP: (!) 86/39 (!) 77/41  Pulse: 64 66  Resp: (!) 25 (!) 24  Temp:    SpO2: 99% 100%    Last Pain:  Vitals:   10/31/20 1315  TempSrc:   PainSc: [place]

## 2020-10-31 NOTE — Progress Notes (Signed)
Tube feed stopped at 0000

## 2020-10-31 NOTE — Op Note (Signed)
Preoperative diagnosis: Dysphagia, need for enteral access Postoperative diagnosis: Same as above Procedure: Open Stamm gastrostomy tube placement Surgeon: Dr. Serita Grammes Assistant: Wynelle Link, MD Anesthesia: General Estimated blood loss: 30 cc Drains: None Specimens: None Complications: None Sponge and count was correct completion Disposition to recovery stable condition  Indications: This is a 80 year old male who has not been able to pass his swallowing evaluation and requires feeding access.  He has had multiple prior abdominal surgeries including a mesh repair of an incisional hernia that stretches to his epigastrium upper quadrant.  He did not appear to be a candidate for percutaneous endoscopic gastrostomy or interventional radiology placed feeding access.  I discussed an open gastrostomy with he and his family.  Procedure: After informed consent was obtained the patient was taken to the operating room.  He was given antibiotics.  SCDs were in place.  He was placed under general anesthesia after he had his trach changed to a cuffed trach.  He was then prepped and draped in the standard sterile surgical fashion.  A surgical timeout was then performed.  I made an upper midline incision and carried this down to his mesh.  The old sutures were removed.  There was very little native fascia present.  I entered the mesh and entered in the peritoneal cavity without any injury.  His omentum was adherent to the uppermost portion of the mesh as was his colon.  I took this down with a combination of blunt dissection and cautery.  Once I done this was able to identify the stomach.  The mesh covered most of his left upper quadrant.  There was no other real option except to put the tube through the mesh.  I then created a hole in the skin and carried this through the mesh.  I then brought the tube through of this.  We used a 16 French MIC balloon G-tube.  I then placed two 2-0 silk pursestring  sutures through the stomach.  Made a gastrotomy and inserted the tube.  The pursestrings were tied down.  This was then brought up to the abdominal wall and 2-0 silk was used to secure it in a Stamm fashion.  The balloon was blown up.  This was in good position upon completion and flushed easily.  I then sewed the flange down with a 2-0 nylon suture.  The abdomen was then closed after obtaining hemostasis with multiple interrupted #1 Novafil sutures.  The skin was stapled and a dressing was placed.  He tolerated this well and was taken to PACU in stable condition.

## 2020-10-31 NOTE — Op Note (Signed)
Preoperative diagnosis: Open gastrostomy tube placement followed by hypotension and postoperative bleeding Postoperative diagnosis: Same as above Procedure: Exploratory laparotomy with ligation of omentum, washout of hemoperitoneum Surgeon: Dr. Serita Grammes Assistant: Dr. Ralene Ok Estimated blood loss: 50 cc at time of operation but there was over 1 L in the abdomen upon opening Complications: None Drains: None Specimens: None Special count was correct completion Disposition ICU critical condition  Indications: This is a 80 year old male who went an open Stamm gastrostomy tube placement earlier in the day.  This was difficult due to his prior intraperitoneal mesh and adhesions.  He was hypotensive postoperatively and this responded to volume and blood.  His hemoglobin dropped and I discussed going back to the operating room urgently.  Procedure: After consent was obtained from the patient's daughter I took him to the operating room urgently.  He was placed under anesthesia.  He was prepped and draped in the standard sterile surgical fashion.  Surgical timeout was then performed.  I reopened his laparotomy and there was a large volume of blood present in his intraperitoneal cavity.  I evacuated all of this as well as the clots.  Eventually I was able to identify an area that was tied off before of his omentum that we had taken down from the mesh.  There was a bleeding arterial vessel at this location.  I clamped this with a Kelly clamp and tied it with a 2-0 silk.  I then used a stick tie to oversew this.  I explored the remainder of the abdomen and there did not appear to be any other source of bleeding.  The gastrostomy tube was still in good position.  We irrigated copiously.  I then closed the abdomen which was mostly mesh with #1 Novafil suture.  The skin was closed with staples.  A dressing was placed.  He was almost off all pressors with a normal blood pressure at completion.  He will  be transferred to the ICU in critical condition.  I discussed these findings with his family.

## 2020-10-31 NOTE — Patient Care Conference (Signed)
Inpatient RehabilitationTeam Conference and Plan of Care Update Date: 10/31/2020   Time: 10:42 AM    Patient Name: Devin [name]      Medical Record Number: [id number]  Date of Birth: March 18, 1941 Sex: Male         Room/Bed: 4W15C/4W15C-01 Payor Info: Payor: [employer] MEDICARE / Plan: [facility] MEDICARE / Product Type: *No Product type* /    Admit Date/Time:  10/12/2020  7:03 PM  Primary Diagnosis:  Thoracic compression fracture [facility])  Hospital Problems: Principal Problem:   Thoracic compression fracture [facility]) Active Problems:   Tracheostomy dependence ([place])   Debility   Acute blood loss anemia   Controlled type 2 diabetes mellitus with hyperglycemia, with long-term current use of insulin ([place])   Dysphagia   Hyponatremia   Nasogastric tube present   Status post tracheostomy [facility])   Urinary retention   Sleep disturbance   [facility]ock [facility])    Expected Discharge Date: Expected Discharge Date: 10/31/20  Team Members Present: Physician leading conference: Dr. Alger Simons Care Coodinator Present: Loralee Pacas, LCSWA;Krista Godsil Creig Hines, RN, BSN, [place] Nurse Present: Dorthula Nettles, RN PT Present: Tereasa Coop, PT OT Present: Laverle Hobby, OT SLP Present: Weston Anna, SLP PPS Coordinator present : Gunnar Fusi, SLP     Current Status/Progress Goal Weekly Team Focus  Bowel/Bladder   Pt is currently incontinent of bowel and  has a foley catheter last bm 5/23  no constipation  schedule toileting   Swallow/Nutrition/ Hydration   NPO with ice chips, PEG today  Supervision for participation in dysphagia treatment  trials of ice chips   ADL's   Slightly improved endurance, OOB a little more, Max A LB ADLs, Min A UB ADLs, Min A transfers  Supervision, will likely need to downgrade this week  activity tolerance, OOB tolerance, trasnfers, functional strengthening, self-care retraining   Mobility   CGA gait up to 110', supervision bed mobility  Supervision  stair training,  ambulation, increased time out of bed, DC prep   Communication   #6 XL cuffless trach, capped, Max A for vocal intensity and initiation of verbal expression  Supervision  increased use of verbalizations and increased vocal intensity   Safety/Cognition/ Behavioral Observations  Min-Mod A  Supervision  recall of functinal information, emergent awareness   Pain   Back pain  pain to remain below 3  Assess for pain qshift and Continue PRN pain medication   Skin   Patient  has a closed back insicision, skin tear to left forarm  and Trach site  Skin to remain free from any injuriies  Assess skin qshift and preform skin care     Discharge Planning:  D/c to home with 24/7care from wife and two adult daughters who will rotate their schedule. [place] with finding HH due to insurance and care needs.   Team Discussion: Having G-tube place today. Trach plugged, may possibly be discontinued before discharge. Continent of bowel, foley in place. Reports difficultly sleeping. Gets very anxious and pulls at tubing.  Patient on target to meet rehab goals: yes, working past his fatigue, self-limiting. Min assist to contact guard, ambulates to the bathroom. Max assist for lower body ADL's, min assist for upper body ADL's. Walked 110 ft with contact guard assist and O2 remained stable. Remains NPO, working on vocal intensity.  *See Care Plan and progress notes for long and short-term goals.   Revisions to Treatment Plan:  Not at this time.  Teaching Needs: Family education, medication management, pain management, trach care,  foley care, skin/wound care, transfer training, gait training, balance training, endurance training, diabetes education, safety awareness.  Current Barriers to Discharge: Decreased caregiver support, Medical stability, Home enviroment access/layout, Trach, Incontinence, Neurogenic bowel and bladder, Wound care, Lack of/limited family support, Weight, Weight bearing restrictions,  Medication compliance, Behavior, Nutritional means and New oxygen  Possible Resolutions to Barriers: Continue current medications, provide emotional support.     Medical Summary Current Status: still with severe dysphagia, G-tube being placed today. tolerating trach plugging so far. f/u with ENT pending.  Barriers to Discharge: Medical stability   Possible Resolutions to Barriers/Weekly Focus: trach, PEG mgt, daily assessment of labs   Continued Need for Acute Rehabilitation Level of Care: The patient requires daily medical management by a physician with specialized training in physical medicine and rehabilitation for the following reasons: Direction of a multidisciplinary physical rehabilitation program to maximize functional independence : Yes Medical management of patient stability for increased activity during participation in an intensive rehabilitation regime.: Yes Analysis of laboratory values and/or radiology reports with any subsequent need for medication adjustment and/or medical intervention. : Yes   I attest that I was present, lead the team conference, and concur with the assessment and plan of the team.   Cristi Loron 10/31/2020, 3:34 PM

## 2020-10-31 NOTE — Transfer of Care (Signed)
Immediate Anesthesia Transfer of Care Note  Patient: Devin [name]  Procedure(s) Performed: EXPLORATORY LAPAROTOMY WITH [place] (N/A )  Patient Location: ICU  Anesthesia Type:General  Level of Consciousness: Patient remains intubated per anesthesia plan  Airway & Oxygen Therapy: Patient remains intubated per anesthesia plan and Patient placed on Ventilator (see vital sign flow sheet for setting)  Post-op Assessment: Report given to RN and Post -op Vital signs reviewed and stable  Post vital signs: Reviewed and stable  Last Vitals:  Vitals Value Taken Time  BP    Temp    Pulse 61 10/31/20 1800  Resp 24 10/31/20 1800  SpO2 100 % 10/31/20 1800  Vitals shown include unvalidated device data.  Last Pain:  Vitals:         Complications: No complications documented.

## 2020-10-31 NOTE — H&P (Signed)
Attending:    Subjective: 80 y/o male familiar to the CCM service from a recent hospitalization and tracheostomy presented to the OR today from CIR for an elective open G tube.  Due to the complex nature of the patient's prior surgical history the patient required an open procedure passing the tube through previously placed mesh.  The procedure was without immediate complication but post operatively the patient's blood pressure dropped.  PCCM was called for consultation.  The patient is unable to provide history due to encephalopathy post operatively.  Narcan was administered and vasopressors were started. His tracheostomy was changed for a cuffed tube, a central line was placed, art line was placed.    Recently he had been passing tracheostomy capping trials but the tracheostomy was not removed due to his known supraglottic and subglottic airway occlusion.  Past Medical History:  Diagnosis Date  . A-fib ([place])   . Adenocarcinoma of left lung, stage 1 ([place]) 03/10/2015  . Ankylosing spondylitis ([place])    Diagnosed during lumbar fracture summer of 2016    . Crohn's disease ([place])   . Gout   . HIstory of basal cell cancer of face    THYROID CA HX  . History of kidney stones   . Hypertension   . Hypothyroidism   . Impotence   . 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 Obesity (BMI 30-39.9)   . Osteoporosis    Pt completed 5 years of fosamax in 2013     . Prostate cancer with recurrence    Treated with prostatectomy with recurrence 2012 with Lupron treatment now him   . Psoriasis   . Thyroid cancer ([place]) 1999   Treated with RAI and total thyroidectomy   . Type II diabetes mellitus ([facility])      Family History  Problem Relation Age of Onset  . CAD Mother   . Cancer Neg Hx        No cancer in the patient's immediate family, except of course for the patient himself, as noted     Social History   Socioeconomic History  . Marital status: Married    Spouse name: Not  on file  . Number of children: Not on file  . Years of education: Not on file  . Highest education level: Not on file  Occupational History  . Occupation: Retired  Tobacco Use  . Smoking status: Former Smoker    Packs/day: 2.00    Years: 30.00    Pack years: 60.00    Types: Cigarettes    Quit date: 12/26/1989    Years since quitting: 30.8  . Smokeless tobacco: Never Used  Vaping Use  . Vaping Use: Never used  Substance and Sexual Activity  . Alcohol use: Not Currently    Alcohol/week: 0.0 standard drinks  . Drug use: No  . Sexual activity: Never  Other Topics Concern  . Not on file  Social History Narrative   Retired Development worker, international aid   Social Determinants of Radio broadcast assistant Strain: Not on [employer] Insecurity: Not on file  Transportation Needs: Not on file  Physical Activity: Not on file  Stress: Not on file  Social Connections: Not on file  Intimate Partner Violence: Not on file     No Known Allergies   @encmedstart @    Objective: Vitals:   10/31/20 1505  BP: (!) 67/37  Pulse: 71  Resp: (!) 24  SpO2: 100%   Vent Mode: PRVC FiO2 (%):  [  100 %] 100 % Set Rate:  [16 bmp] 16 bmp Vt Set:  [600 mL] 600 mL PEEP:  [5 cmH20] 5 cmH20 No intake or output data in the 24 hours ending 10/31/20 1541  General:  In bed on vent HENT: NCAT tracheostomy in place PULM: Rhonchi bilaterally, vent supported breathing CV: RRR, no mgr GI: no bowel sounds, G tube in place MSK: normal bulk and tone Neuro: drowsy, but nods head appropriately and moves all four extremities    CBC    Component Value Date/Time   WBC 12.3 (H) 10/30/2020 0617   RBC 3.26 (L) 10/30/2020 0617   HGB 8.8 (L) 10/31/2020 1238   HGB 12.9 (L) 09/06/2019 0924   HGB 12.6 (L) 02/18/2017 1011   HCT 26.0 (L) 10/31/2020 1238   HCT 37.3 (L) 02/18/2017 1011   PLT 266 10/30/2020 0617   PLT 261 09/06/2019 0924   PLT 246 02/18/2017 1011   MCV 97.5 10/30/2020 0617   MCV 94.2 02/18/2017 1011   MCH  29.8 10/30/2020 0617   MCHC 30.5 10/30/2020 0617   RDW 15.7 (H) 10/30/2020 0617   RDW 13.3 02/18/2017 1011   LYMPHSABS 0.9 10/30/2020 0617   LYMPHSABS 1.6 02/18/2017 1011   MONOABS 0.8 10/30/2020 0617   MONOABS 0.6 02/18/2017 1011   EOSABS 0.3 10/30/2020 0617   EOSABS 0.2 02/18/2017 1011   BASOSABS 0.1 10/30/2020 0617   BASOSABS 0.0 02/18/2017 1011    BMET    Component Value Date/Time   NA 125 (L) 10/31/2020 1238   NA 144 02/07/2020 1506   NA 140 02/18/2017 1011   K 5.8 (H) 10/31/2020 1238   K 4.5 02/18/2017 1011   CL 85 (L) 10/30/2020 0617   CO2 39 (H) 10/30/2020 0617   CO2 22 02/18/2017 1011   GLUCOSE 126 (H) 10/30/2020 0617   GLUCOSE 135 02/18/2017 1011   GLUCOSE 85 03/18/2006 0835   BUN 40 (H) 10/30/2020 0617   BUN 23 02/07/2020 1506   BUN 32.1 (H) 02/18/2017 1011   CREATININE 0.99 10/30/2020 0617   CREATININE 1.39 (H) 09/06/2019 0924   CREATININE 1.9 (H) 02/18/2017 1011   CALCIUM 9.3 10/30/2020 0617   CALCIUM 9.8 02/18/2017 1011   GFRNONAA >60 10/30/2020 0617   GFRNONAA 49 (L) 09/06/2019 0924   GFRAA >60 02/09/2020 0355   GFRAA 57 (L) 09/06/2019 0924    CXR images reviewed, CVL and tracheostomy in place  Impression/Plan:  Post operative shock, presumably due to adrenal insufficiency but at this time undifferentiated as work up is incomplete: > admit to ICU > CVP monitoring > administer 2 U PRBC now > stat labs: CBC, trop, coag, lactic acid, bmet, cortisol > 12 lead EKG > vasopressin, levophed, neosynephrine  > may need to go back to OR  Acute respiratory failure with hypercarbia > change tracheostomy to cuffed tube > start full vent support  > abg  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

## 2020-10-31 NOTE — Progress Notes (Signed)
[facility] PCCM  Seen after trip back to OR Now much more stable Resting comfortably on vent Lengthy conversation with daughter and wife: while he has recovered from today's episode, it is very unlikely he would survive a cardiac arrest.  They do not want him to receive CPR or electric shocks in the event of an arrest.  DNR code status now, but will continue supportive care to try to get him home.  Roselie Awkward, MD [place] [facility] Pager: [phone] Cell: [phone] If no response, please call [phone] until 7pm After 7:00 pm call Elink  [phone]

## 2020-10-31 NOTE — Progress Notes (Signed)
Inpatient Rehabilitation Admissions Coordinator  Patient readmitted to acute after PEG placement from CIR. I will follow.  Danne Baxter, RN, MSN Rehab Admissions Coordinator [phone] 10/31/2020 3:50 PM

## 2020-10-31 NOTE — Procedures (Addendum)
Central Venous Catheter Insertion Procedure Note  NIVIN BRANIFF  [id number]  [birth year]  Date:10/31/20  Time:3:39 PM   Provider Performing:Brent Saory Carriero   Procedure: Insertion of Non-tunneled Central Venous Catheter(36556) with US guidance (07371)   Indication(s) Medication administration  Consent Unable to obtain consent due to emergent nature of procedure.  Anesthesia Topical only with 1% lidocaine   Timeout Verified patient identification, verified procedure, site/side was marked, verified correct patient position, special equipment/implants available, medications/allergies/relevant history reviewed, required imaging and test results available.  Sterile Technique Maximal sterile technique including full sterile barrier drape, hand hygiene, sterile gown, sterile gloves, mask, hair covering, sterile ultrasound probe cover (if used).  Procedure Description Area of catheter insertion was cleaned with chlorhexidine and draped in sterile fashion.  With real-time ultrasound guidance a central venous catheter was placed into the left internal jugular vein. Nonpulsatile blood flow and easy flushing noted in all ports.  The catheter was sutured in place and sterile dressing applied.  Complications/Tolerance None; patient tolerated the procedure well. Chest X-ray is ordered to verify placement for internal jugular or subclavian cannulation.   Chest x-ray is not ordered for femoral cannulation.  EBL Minimal  Specimen(s) None  Roselie Awkward, MD [place] P[facility]M Pager: [phone] Cell: [phone] If no response, please call [phone] until 7pm After 7:00 pm call Elink  [phone]

## 2020-11-01 ENCOUNTER — Inpatient Hospital Stay (HOSPITAL_COMMUNITY): Payer: Medicare Other

## 2020-11-01 ENCOUNTER — Encounter (HOSPITAL_COMMUNITY): Payer: Self-pay | Admitting: General Surgery

## 2020-11-01 DIAGNOSIS — J9602 Acute respiratory failure with hypercapnia: Secondary | ICD-10-CM

## 2020-11-01 LAB — BPAM FFP
Blood Product Expiration Date: 202205292359
Blood Product Expiration Date: 202205292359
ISSUE DATE / TIME: 202205241624
ISSUE DATE / TIME: 202205241624
Unit Type and Rh: 6200
Unit Type and Rh: 6200

## 2020-11-01 LAB — BASIC METABOLIC PANEL
Anion gap: 9 (ref 5–15)
BUN: 38 mg/dL — ABNORMAL HIGH (ref 8–23)
CO2: 24 mmol/L (ref 22–32)
Calcium: 8.4 mg/dL — ABNORMAL LOW (ref 8.9–10.3)
Chloride: 96 mmol/L — ABNORMAL LOW (ref 98–111)
Creatinine, Ser: 1.33 mg/dL — ABNORMAL HIGH (ref 0.61–1.24)
GFR, Estimated: 55 mL/min — ABNORMAL LOW (ref >60)
Glucose, Bld: 117 mg/dL — ABNORMAL HIGH (ref 70–99)
Potassium: 4.8 mmol/L (ref 3.5–5.1)
Sodium: 129 mmol/L — ABNORMAL LOW (ref 135–145)

## 2020-11-01 LAB — TYPE AND SCREEN
ABO/RH(D): O POS
Antibody Screen: NEGATIVE
Unit division: 0
Unit division: 0
Unit division: 0
Unit division: 0

## 2020-11-01 LAB — BPAM RBC
Blood Product Expiration Date: 202206232359
Blood Product Expiration Date: 202206232359
Blood Product Expiration Date: 202206232359
Blood Product Expiration Date: 202206242359
ISSUE DATE / TIME: 202205241454
ISSUE DATE / TIME: 202205241454
ISSUE DATE / TIME: 202205241622
ISSUE DATE / TIME: 202205241622
Unit Type and Rh: 5100
Unit Type and Rh: 5100
Unit Type and Rh: 5100
Unit Type and Rh: 5100

## 2020-11-01 LAB — GLUCOSE, CAPILLARY
Glucose-Capillary: 121 mg/dL — ABNORMAL HIGH (ref 70–99)
Glucose-Capillary: 121 mg/dL — ABNORMAL HIGH (ref 70–99)
Glucose-Capillary: 123 mg/dL — ABNORMAL HIGH (ref 70–99)
Glucose-Capillary: 147 mg/dL — ABNORMAL HIGH (ref 70–99)
Glucose-Capillary: 95 mg/dL (ref 70–99)
Glucose-Capillary: 99 mg/dL (ref 70–99)

## 2020-11-01 LAB — CBC
HCT: 23.2 % — ABNORMAL LOW (ref 39.0–52.0)
HCT: 23.4 % — ABNORMAL LOW (ref 39.0–52.0)
Hemoglobin: 8.3 g/dL — ABNORMAL LOW (ref 13.0–17.0)
Hemoglobin: 8 g/dL — ABNORMAL LOW (ref 13.0–17.0)
MCH: 31.1 pg (ref 26.0–34.0)
MCH: 30.5 pg (ref 26.0–34.0)
MCHC: 35.8 g/dL (ref 30.0–36.0)
MCHC: 34.2 g/dL (ref 30.0–36.0)
MCV: 86.9 fL (ref 80.0–100.0)
MCV: 89.3 fL (ref 80.0–100.0)
Platelets: 162 10*3/uL (ref 150–400)
Platelets: 159 10*3/uL (ref 150–400)
RBC: 2.67 MIL/uL — ABNORMAL LOW (ref 4.22–5.81)
RBC: 2.62 MIL/uL — ABNORMAL LOW (ref 4.22–5.81)
RDW: 15.3 % (ref 11.5–15.5)
RDW: 15.9 % — ABNORMAL HIGH (ref 11.5–15.5)
WBC: 15.9 10*3/uL — ABNORMAL HIGH (ref 4.0–10.5)
WBC: 15.5 10*3/uL — ABNORMAL HIGH (ref 4.0–10.5)
nRBC: 0 % (ref 0.0–0.2)
nRBC: 0 % (ref 0.0–0.2)

## 2020-11-01 LAB — PREPARE FRESH FROZEN PLASMA
Unit division: 0
Unit division: 0

## 2020-11-01 LAB — CBC WITH DIFFERENTIAL/PLATELET
Abs Immature Granulocytes: 0.15 10*3/uL — ABNORMAL HIGH (ref 0.00–0.07)
Basophils Absolute: 0 10*3/uL (ref 0.0–0.1)
Basophils Relative: 0 %
Eosinophils Absolute: 0 10*3/uL (ref 0.0–0.5)
Eosinophils Relative: 0 %
HCT: 24.2 % — ABNORMAL LOW (ref 39.0–52.0)
Hemoglobin: 8.6 g/dL — ABNORMAL LOW (ref 13.0–17.0)
Immature Granulocytes: 1 %
Lymphocytes Relative: 5 %
Lymphs Abs: 0.8 10*3/uL (ref 0.7–4.0)
MCH: 30.8 pg (ref 26.0–34.0)
MCHC: 35.5 g/dL (ref 30.0–36.0)
MCV: 86.7 fL (ref 80.0–100.0)
Monocytes Absolute: 0.9 10*3/uL (ref 0.1–1.0)
Monocytes Relative: 6 %
Neutro Abs: 13.7 10*3/uL — ABNORMAL HIGH (ref 1.7–7.7)
Neutrophils Relative %: 88 %
Platelets: 175 10*3/uL (ref 150–400)
RBC: 2.79 MIL/uL — ABNORMAL LOW (ref 4.22–5.81)
RDW: 15.1 % (ref 11.5–15.5)
WBC: 15.6 10*3/uL — ABNORMAL HIGH (ref 4.0–10.5)
nRBC: 0 % (ref 0.0–0.2)

## 2020-11-01 LAB — LACTIC ACID, PLASMA: Lactic Acid, Venous: 4.1 mmol/L (ref 0.5–1.9)

## 2020-11-01 LAB — MAGNESIUM: Magnesium: 2 mg/dL (ref 1.7–2.4)

## 2020-11-01 MED ORDER — LEVOTHYROXINE SODIUM 25 MCG PO TABS
125.0000 ug | ORAL_TABLET | Freq: Every day | ORAL | Status: DC
  Administered 2020-11-02 – 2020-11-16 (×14): 125 ug
  Filled 2020-11-01 (×14): qty 1

## 2020-11-01 MED ORDER — MIDODRINE HCL 5 MG PO TABS: 5.0000 mg | ORAL_TABLET | Freq: Three times a day (TID) | ORAL | Status: DC

## 2020-11-01 MED ORDER — HYDROCORTISONE 10 MG PO TABS
10.0000 mg | ORAL_TABLET | Freq: Two times a day (BID) | ORAL | Status: DC
  Filled 2020-11-01: qty 1

## 2020-11-01 MED ORDER — VITAL AF 1.2 CAL PO LIQD
1000.0000 mL | ORAL | Status: DC
  Administered 2020-11-01 – 2020-11-20 (×23): 1000 mL
  Filled 2020-11-01 (×20): qty 1000

## 2020-11-01 MED ORDER — LACTATED RINGERS IV SOLN: INTRAVENOUS | Status: AC

## 2020-11-01 MED ORDER — SODIUM CHLORIDE 0.9% FLUSH: 10.0000 mL | INTRAVENOUS | Status: DC | PRN

## 2020-11-01 MED ORDER — SODIUM CHLORIDE 0.9% FLUSH
10.0000 mL | Freq: Two times a day (BID) | INTRAVENOUS | Status: DC
  Administered 2020-11-01 – 2020-11-20 (×32): 10 mL

## 2020-11-01 MED ORDER — CHLORHEXIDINE GLUCONATE CLOTH 2 % EX PADS
6.0000 | MEDICATED_PAD | Freq: Every day | CUTANEOUS | Status: DC
  Administered 2020-11-01 – 2020-11-20 (×21): 6 via TOPICAL

## 2020-11-01 MED ORDER — VITAL HIGH PROTEIN PO LIQD
1000.0000 mL | ORAL | Status: DC
  Administered 2020-11-01: 1000 mL

## 2020-11-01 MED ORDER — ONDANSETRON HCL 4 MG/2ML IJ SOLN
4.0000 mg | Freq: Four times a day (QID) | INTRAMUSCULAR | Status: DC | PRN
  Administered 2020-11-01 – 2020-11-15 (×4): 4 mg via INTRAVENOUS
  Filled 2020-11-01 (×4): qty 2

## 2020-11-01 MED ORDER — CHOLESTYRAMINE 4 G PO PACK
4.0000 g | PACK | Freq: Two times a day (BID) | ORAL | Status: DC
  Administered 2020-11-01 – 2020-11-20 (×37): 4 g
  Filled 2020-11-01 (×41): qty 1

## 2020-11-01 NOTE — Plan of Care (Signed)
  Problem: Clinical Measurements: Goal: Diagnostic test results will improve Outcome: Progressing   Problem: Nutrition: Goal: Adequate nutrition will be maintained Outcome: Progressing Note: Tube feeds started through G-Tube today   Problem: Elimination: Goal: Will not experience complications related to urinary retention Outcome: Progressing   Problem: Respiratory: Goal: Ability to maintain a clear airway and adequate ventilation will improve Outcome: Progressing   Problem: Activity: Goal: Ability to tolerate increased activity will improve Outcome: Not Progressing Note: Pt is able to tolerae HOB at 31 degrees well. Unable to mobilize him further at this time.

## 2020-11-01 NOTE — Progress Notes (Signed)
Now hypertensive, hold midodrine and hydrocortisone for now.

## 2020-11-01 NOTE — Progress Notes (Signed)
1 Day Post-Op   Subjective/Chief Complaint: Responds on vent    Objective: Vital signs in last 24 hours: Temp:  [94.5 F (34.7 C)-100 F (37.8 C)] 100 F (37.8 C) (05/25 0742) Pulse Rate:  [61-89] 87 (05/25 0823) Resp:  [14-30] 30 (05/25 0823) BP: (59-140)/(28-89) 100/64 (05/25 0500) SpO2:  [95 %-100 %] 100 % (05/25 1113) Arterial Line BP: (58-164)/(34-64) 118/51 (05/25 0600) FiO2 (%):  [40 %-100 %] 40 % (05/25 1113) Weight:  [88.2 kg] 88.2 kg (05/25 0500) Last BM Date: 11/01/20  Intake/Output from previous day: 05/24 0701 - 05/25 0700 In: 4153.8 [I.V.:1281.5; Blood:972; IV Piggyback:1900.4] Out: 2500 [Urine:490; Drains:10; Blood:2000] Intake/Output this shift: No intake/output data recorded.  GI: soft approp tender nondistended incision clean g tube in place  Lab Results:  Recent Labs    10/31/20 1835 11/01/20 0431  WBC 45.3* 15.6*  HGB 10.0* 8.6*  HCT 29.4* 24.2*  PLT 187 175   BMET Recent Labs    10/31/20 1835 11/01/20 0431  NA 126* 129*  K 5.3* 4.8  CL 91* 96*  CO2 17* 24  GLUCOSE 293* 117*  BUN 38* 38*  CREATININE 1.25* 1.33*  CALCIUM 8.9 8.4*   PT/INR Recent Labs    10/31/20 1835  LABPROT 15.8*  INR 1.3*   ABG Recent Labs    10/31/20 1649 10/31/20 1730  PHART 7.356 7.335*  HCO3 21.3 22.4    Studies/Results: DG CHEST PORT 1 VIEW  Result Date: 11/01/2020 CLINICAL DATA:  Respiratory failure EXAM: PORTABLE CHEST 1 VIEW COMPARISON:  10/31/2020 FINDINGS: Tracheostomy, left internal jugular temporary hemodialysis catheter with its tip within the superior vena cava, nasoenteric feeding tube extending into the upper abdomen, are all unchanged. Pulmonary insufflation is stable. Mild right basilar atelectasis. Small laterally loculated left pleural effusion again noted. No pneumothorax. Cardiac size within normal limits. Pulmonary vascularity is normal. No acute bone abnormality. Thoracolumbar fusion hardware again noted IMPRESSION: Stable support  lines and tubes. Stable pulmonary insufflation with right basilar atelectasis. Stable small laterally loculated left pleural effusion Electronically Signed   By: Fidela Salisbury MD   On: 11/01/2020 04:12   DG Chest Port 1 View  Result Date: 10/31/2020 CLINICAL DATA:  Trach ostomy tube change, central line insertion EXAM: PORTABLE CHEST 1 VIEW COMPARISON:  Portable exam 1525 hours compared to 10/22/2020 FINDINGS: New tracheostomy tube with tip projecting 13 mm above carina. LEFT jugular central venous catheter with tip projecting over SVC. Tip of feeding tube projects over distal gastric antrum. Normal heart size and mediastinal contours. Elevation of RIGHT diaphragm with bibasilar atelectasis and minimal LEFT pleural effusion. No definite pneumothorax. Bones demineralized with prior thoracolumbar spinal fixation. IMPRESSION: Line and tube positions as above. Bibasilar atelectasis and minimal LEFT pleural effusion. Electronically Signed   By: Lavonia Dana M.D.   On: 10/31/2020 15:43    Anti-infectives: Anti-infectives (From admission, onward)   None      Assessment/Plan: POD 1 elap/loa/gastrostomy placement followed by reoperation for hemorrhagic shock- Devin [name] -appreciate CCM care -can use g tube today, appears he has resolved hemorrhage and improving -can remove cortrak -hold heparin  Devin [name] 11/01/2020

## 2020-11-01 NOTE — Progress Notes (Signed)
Inpatient Rehabilitation Care Coordinator Discharge Note  The overall goal for the admission was met for:   Discharge location: Yes. D/c to acute hospital.  11/01/20- 1130am- SW confirmed with Cheryl/[facility] who reported pt accepted for HHPT/OT/SLP/aide/SN (trach care and TF education). Will need follow-up on care needs to confirm there will be no issues with the branch accepting. [place] [phone])  willing to accept pt for enteral feeds. Follow-up to provide TF regimen.   Length of Stay: Yes. 18 days.   Discharge activity level: Yes. Mod A  Home/community participation: Yes  Services provided included: MD, RD, PT, OT, SLP, CM, TR, Pharmacy, Neuropsych and SW  Financial Services: Private Insurance: [facility] Medicare  Choices offered to/list presented to:Yes  Follow-up services arranged: HHA- [facility] for HHPT/OT/SLP/aide/SN (trach care and TF); HHA preference [facility]; DME- N/A  Comments (or additional information):  Patient/Family verbalized understanding of follow-up arrangements: Yes  Individual responsible for coordination of the follow-up plan: contact pt   Confirmed correct DME delivered: Rana Snare 11/01/2020    Rana Snare

## 2020-11-01 NOTE — Progress Notes (Addendum)
Initial Nutrition Assessment  DOCUMENTATION CODES:   Non-severe (moderate) malnutrition in context of chronic illness  INTERVENTION:   Initiate tube feeding via PEG: Vital AF 1.2 at 25 ml/h, increase by 10 ml every 4 hours to goal rate of 75 ml/h (1800 ml per day)  Provides 2160 kcal, 135 gm protein, 1460 ml free water daily.  NUTRITION DIAGNOSIS:   Moderate Malnutrition related to chronic illness (Crohn's disease, Lung cancer) as evidenced by mild fat depletion,moderate fat depletion,mild muscle depletion,moderate muscle depletion.  GOAL:   Patient will meet greater than or equal to 90% of their needs  MONITOR:   Vent status,TF tolerance  REASON FOR ASSESSMENT:   Ventilator,Consult Enteral/tube feeding initiation and management  ASSESSMENT:   80 yo male admitted from rehab on 5/24 with post op hypotension S/P PEG. PMH includes Crohn's disease, ankylosing spondylitis, osteoporosis, hypothyroidism, DM-2, HTN, prostate CA, A fib. Recent hospital admission 4/3 for spinal fracture s/p fall at home. Admission was complicated by prolonged mechanical ventilation ultimately requiring tracheostomy on 5/3. Transferred to CIR 5/5.    On Rehab, patient was receiving Osmolite 1.5 TF via Cortrak tube. S/P exploratory laparotomy, LOA, g-tube placement on 5/24. Developed a bleed and required return to the OR for clamping of bleeding arterial vessel. Okay to use G-tube today per surgery note.   Discussed patient in ICU rounds and with RN today. Received MD Consult for TF initiation and management. Vital High Protein currently infusing at 20 ml/h.  Patient is currently on ventilator support via trach. MV: 15.8 L/min Temp (24hrs), Avg:97.9 F (36.6 C), Min:94.5 F (34.7 C), Max:100 F (37.8 C)   Labs reviewed. Na 129 CBG: 95-99  Medications reviewed and include colace, novolog, miralax, pepcid, levophed.   NUTRITION - FOCUSED PHYSICAL EXAM:  Flowsheet Row Most Recent Value   Orbital Region Mild depletion  Upper Arm Region Unable to assess  Thoracic and Lumbar Region Moderate depletion  Buccal Region Moderate depletion  Temple Region Mild depletion  Clavicle Bone Region Moderate depletion  Clavicle and Acromion Bone Region Moderate depletion  Scapular Bone Region Unable to assess  Dorsal Hand Moderate depletion  Patellar Region Mild depletion  Anterior Thigh Region Mild depletion  Posterior Calf Region Mild depletion  Edema (RD Assessment) Mild  Hair Reviewed  Eyes Unable to assess  Mouth Unable to assess  Skin Reviewed  Nails Reviewed       Diet Order:   Diet Order            Diet NPO time specified  Diet effective now                 EDUCATION NEEDS:   No education needs have been identified at this time  Skin:  Skin Integrity Issues:: Stage III Stage III: vertebral column Other: MASD to perineum  Last BM:  5/25 type 6/7  Height:   Ht Readings from Last 1 Encounters:  10/31/20 5' 11"  (1.803 m)    Weight:   Wt Readings from Last 1 Encounters:  11/01/20 88.2 kg    BMI:  Body mass index is 27.12 kg/m.  Estimated Nutritional Needs:   Kcal:  2160  Protein:  130-140 gm  Fluid:  >/= 2.1 L    Lucas Mallow, RD, LDN, CNSC Please refer to Amion for contact information.

## 2020-11-01 NOTE — Progress Notes (Signed)
eLink Physician-Brief Progress Note Patient Name: WAEL MAESTAS DOB: Mar 23, 1941 MRN: [id number]   Date of Service  11/01/2020  HPI/Events of Note  Persistent episodes of N/V. QTc interval = 0.46.   eICU Interventions  Plan: 1. Zofran 4 mg IV Q 6 hours PRN N/V.     Intervention Category Major Interventions: Other:  Lysle Dingwall 11/01/2020, 11:19 PM

## 2020-11-01 NOTE — Progress Notes (Signed)
NAME:  Devin [name], MRN:  [id number], DOB:  1940/12/17, LOS: 1 ADMISSION DATE:  10/31/2020, CONSULTATION DATE:  5/3 REFERRING MD:  Verlon Au, CHIEF COMPLAINT:  Dyspnea   History of Present Illness:  80 y/o had a fall and spinal fracture with post admission course complicated by prolonged mechanical ventilation and 2 extubation failures, ultimately requiring a tracheostomy.  There was concern for supra and sub-glottic airway narrowing.  XL tracheostomy ultimately placed on 5/3.  Moved to Rehab and has been working with them there.  On 5/24 [name] underwent PEG tube placement in the OR with Dr. Donne Hazel without incident. PCCM was called to PACU for admission for post-op hypotension. ABG 7.25/68/144/30. HGB on iStat 6.2. HGB post op 7.1. On 195 phenylephrine. PCCM to admit.  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 Was able to bypass with distal XLT. 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 Voice quality better. Feeling stronger   5/16 foley placed by urology. working with OT, talking   5/19 capping trials with tracheostomy  5/24 PEG placement in OR. PCCM reconsulted on for hypotension. 6.0 XLTD cuffless swithced to 6.0 XLTD cuffed. CVC.  Went to OR for ex lap, washout, ligation of omental artery bleed  Interim History / Subjective:  Doing  better, more awake  Objective   Blood pressure 100/64, pulse 79, temperature 98.8 F (37.1 C), temperature source Axillary, resp. rate (!) 30, weight 88.2 kg, SpO2 100 %.    Vent Mode: PRVC FiO2 (%):  [40 %-100 %] 40 % Set Rate:  [16 bmp-30 bmp] 30 bmp Vt Set:  [600 mL] 600 mL PEEP:  [5 cmH20] 5 cmH20 Plateau Pressure:  [24 cmH20-25 cmH20] 25 cmH20   Intake/Output Summary (Last 24 hours) at 11/01/2020 2035 Last data filed at 11/01/2020 0600 Gross per 24 hour  Intake 4153.8 ml  Output 2500 ml  Net 1653.8 ml   Filed Weights   11/01/20 0500  Weight: 88.2 kg    Examination: Constitutional: no acute distress, chronically ill appearing  Eyes: tracking, pupils equal Ears, nose, mouth, and throat: trach in place, minimal secretions Cardiovascular: RRR, +SEM Respiratory: scattered rhonci worse at R base, triggers vent but weak Gastrointestinal: tender to palpation but soft, PEG in place, lap incision site CDI Skin: No rashes, normal turgor Neurologic: moves all 4 ext to command, weak Psychiatric: RASS 0, anxious Foley with cloudy urine  Labs/imaging that I havepersonally reviewed  (right click and "Reselect all SmartList Selections" daily)  Sodium remains low Cr slightly higher Lactate 10>>9>>4 1 PRBC 2 FFP: Hgb 7>>10>>8 INR 1.3  Resolved Hospital Problem list     Assessment & Plan:  Acute on Chronic Respiratory Failure with Hypercarbia 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 Back on vent in settings of ABLA and hemorrhagic shock - Wean with PS, if does well will work toward TC and back to cuffless trach  Hemorrhagic shock- pressor requirement improved, question of baseline vasoplegia; INR  okay, plts okay Hyperkalemia- resolved - Switch IV hydrocortisone to low dose PO - Start midodrine, wean levophed - Monitor H/H at noon and 5 PM today then space to q12h  New PEG Placement S/P placment in OR on 5/24 by Donne Hazel MD - Okay to use, remove  cortrak  DM2- variable sugars - SSI  Hx Afib on AC PTA - Hold AC for now until hgb stabilizes  Deconditioning- re-engage PT/OT   Best practice (right click and "Reselect all SmartList Selections" daily)  Diet:  TF Pain/Anxiety/Delirium protocol (if indicated): N/A VAP protocol (if indicated): Yes DVT prophylaxis: SCD GI prophylaxis: PPI Glucose control:  SSI yes Central venous access:  Needs Arterial line:  Yes, and needs Foley:  Yes, and needs: urology placed Mobility:  bedrest PT consulted: n/a Last date of multidisciplinary goals of care discussion 5/24, see Dr. Anastasia Pall note Code Status:  DNR Disposition: ICU   Patient critically ill due to respiratory failure, shock Interventions to address this today pressor titration, vent titration Risk of deterioration without these interventions is high  I personally spent 35 minutes providing critical care not including any separately billable procedures  Erskine Emery MD [place] [facility]  Prefer epic messenger for cross cover needs If after hours, please call E-link

## 2020-11-01 NOTE — Progress Notes (Signed)
OTO HNS PROGRESS NOTE  Asked by CCM to evaluate patient's airway with nasolaryngoscopy as he had been tolerating trach capping trials well. Patient had PEG tube placement complicated by arterial bleed yesterday which required return to OR for hemostasis and washout. He is currently on pressure support. Awake and alert.   Procedure: Transnasal fiberoptic laryngoscopy Anesth: Topical with 4% lidocaine Compl: None Findings: Normal base of tongue, supraglottis, and glottis, no evidence of previously seen fullness of posterior pharyngeal wall. Patient noted to have a significant amount of silent aspiration during examination, with pooling of secretions in bilateral pyriform recesses. Vocal fold mobility appears intact, no evidence of obstruction. Unable to visualize subglottis secondary to secretions.  Description:  After discussing risks, benefits, and alternatives, the patient was placed in a seated position and the right nasal passage was sprayed with topical anesthetic.  The fiberoptic scope was passed through the right nasal passage to view the pharynx and larynx.  Findings are noted above.  The scope was then removed and he was returned to nursing care in stable condition.  A/P Devin [name] is a 80 y/o M with acute on chronic respiratory failure, s/p tracheostomy 09/19/2020, s/p DL with biopsy on 10/04/2020 with resolution of previously noted fullness of right posterior pharyngeal wall. -FFL performed today with no evidence of obstruction, however patient had copious pooled secretions in bilateral pyriform recesses as well as silent aspiration.  -Recommend continued trach at this time for pulmonary toilet in light of silent aspiration -Medical management as per primary  Thank you for allowing me to participate in the care of this patient. Please do not hesitate to contact me with any questions or concerns.   [name], [facility] Cell: [phone]

## 2020-11-01 NOTE — Progress Notes (Signed)
Inpatient Rehabilitation Admissions Coordinator   I met at bedside with patient and his daughter. I will continue to follow.  Danne Baxter, RN, MSN Rehab Admissions Coordinator [phone] 11/01/2020 12:55 PM

## 2020-11-02 DIAGNOSIS — R579 Shock, unspecified: Secondary | ICD-10-CM

## 2020-11-02 LAB — GLUCOSE, CAPILLARY
Glucose-Capillary: 130 mg/dL — ABNORMAL HIGH (ref 70–99)
Glucose-Capillary: 99 mg/dL (ref 70–99)
Glucose-Capillary: 106 mg/dL — ABNORMAL HIGH (ref 70–99)
Glucose-Capillary: 106 mg/dL — ABNORMAL HIGH (ref 70–99)
Glucose-Capillary: 104 mg/dL — ABNORMAL HIGH (ref 70–99)

## 2020-11-02 LAB — BASIC METABOLIC PANEL
Anion gap: 8 (ref 5–15)
BUN: 42 mg/dL — ABNORMAL HIGH (ref 8–23)
CO2: 25 mmol/L (ref 22–32)
Calcium: 8 mg/dL — ABNORMAL LOW (ref 8.9–10.3)
Chloride: 98 mmol/L (ref 98–111)
Creatinine, Ser: 1.41 mg/dL — ABNORMAL HIGH (ref 0.61–1.24)
GFR, Estimated: 51 mL/min — ABNORMAL LOW (ref >60)
Glucose, Bld: 99 mg/dL (ref 70–99)
Potassium: 3.7 mmol/L (ref 3.5–5.1)
Sodium: 131 mmol/L — ABNORMAL LOW (ref 135–145)

## 2020-11-02 LAB — CBC
HCT: 21.9 % — ABNORMAL LOW (ref 39.0–52.0)
Hemoglobin: 7.5 g/dL — ABNORMAL LOW (ref 13.0–17.0)
MCH: 31 pg (ref 26.0–34.0)
MCHC: 34.2 g/dL (ref 30.0–36.0)
MCV: 90.5 fL (ref 80.0–100.0)
Platelets: 127 10*3/uL — ABNORMAL LOW (ref 150–400)
RBC: 2.42 MIL/uL — ABNORMAL LOW (ref 4.22–5.81)
RDW: 15.9 % — ABNORMAL HIGH (ref 11.5–15.5)
WBC: 13.7 10*3/uL — ABNORMAL HIGH (ref 4.0–10.5)
nRBC: 0 % (ref 0.0–0.2)

## 2020-11-02 LAB — MAGNESIUM: Magnesium: 1.9 mg/dL (ref 1.7–2.4)

## 2020-11-02 IMAGING — CT CT CHEST WITHOUT CONTRAST
2 of 4 series · 15 of 36 positions shown, 18 images · non-contrast
Comparison: Chest CT 02/25/2018.

CLINICAL DATA: 79-year-old male with history of left-sided lung
cancer status post left upper lobectomy. Additional history of
prostate cancer undergoing Lupron injections. History of thyroid
cancer status post radioactive iodine treatment.

EXAM:
CT CHEST WITHOUT CONTRAST
TECHNIQUE: Multidetector CT imaging of the chest was performed following the
standard protocol without IV contrast.

[Series 2: thorax · axial · 0.89mm/px · z∈[-287,-7]mm · 12 of 164 slices shown, 15 images]
[im 12/164  mediastinal]
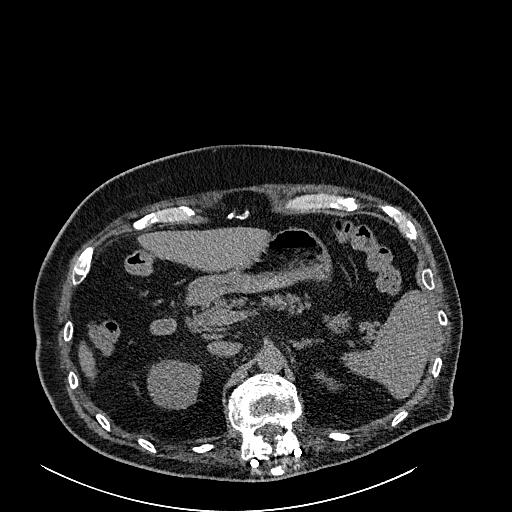
[im 12/164  lung]
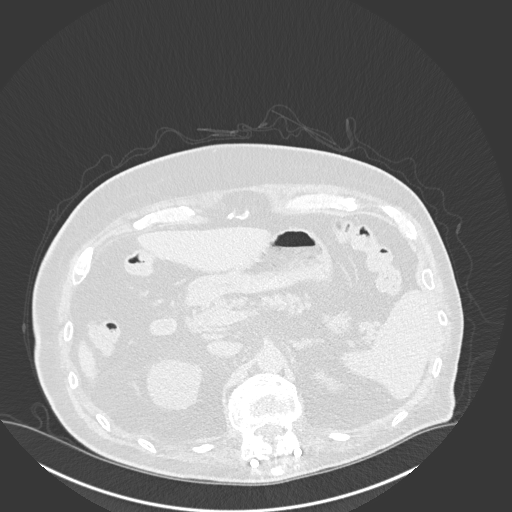
[im 24/164  lung]
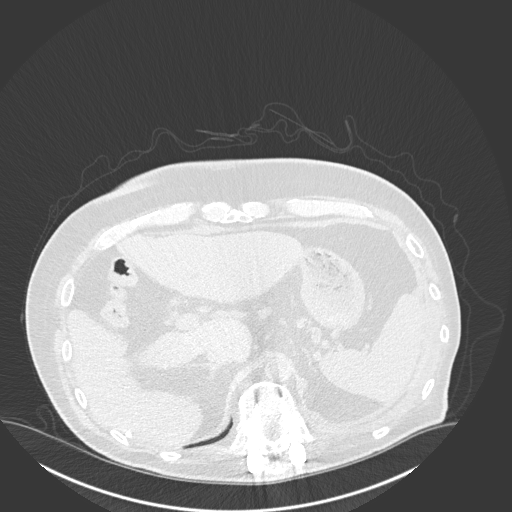
[im 35/164  lung]
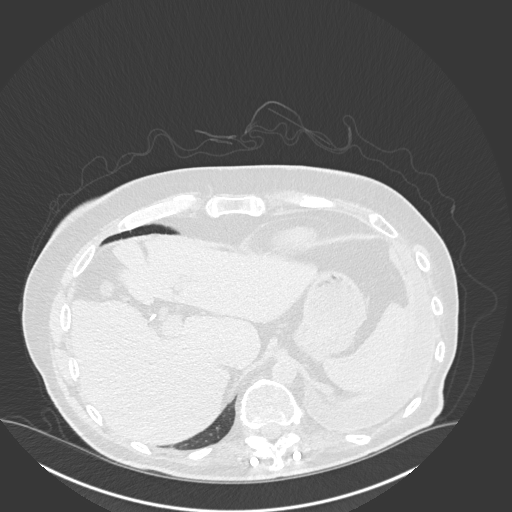
[im 47/164  lung]
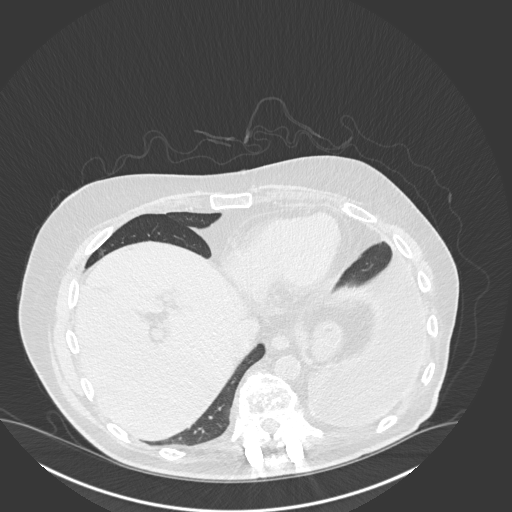
[im 59/164  mediastinal]
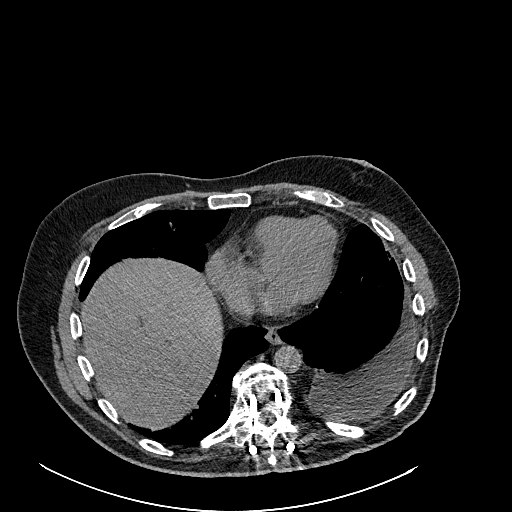
[im 59/164  lung]
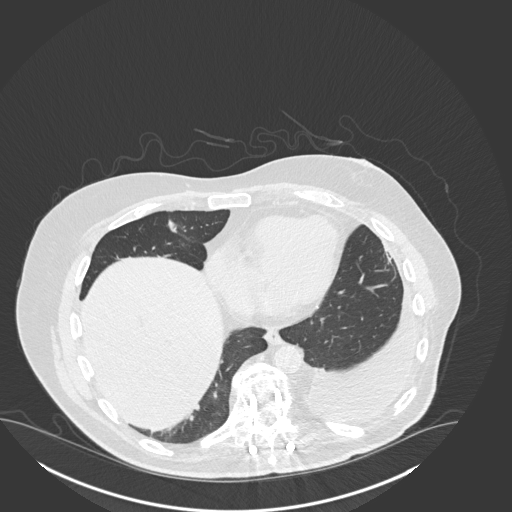
[im 70/164  lung]
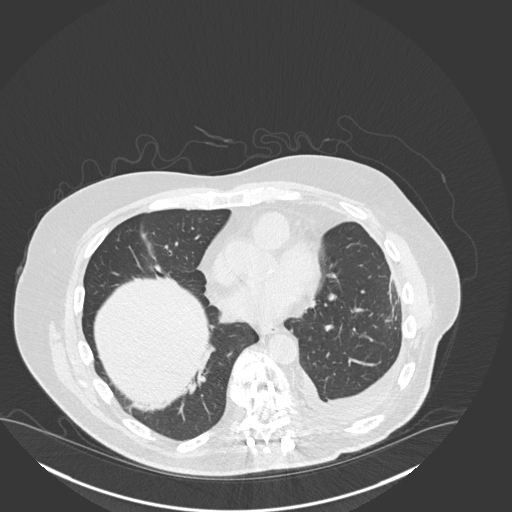
[im 94/164  lung]
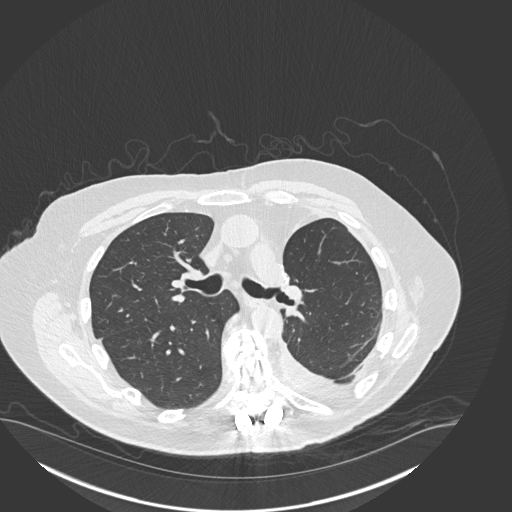
[im 105/164  lung]
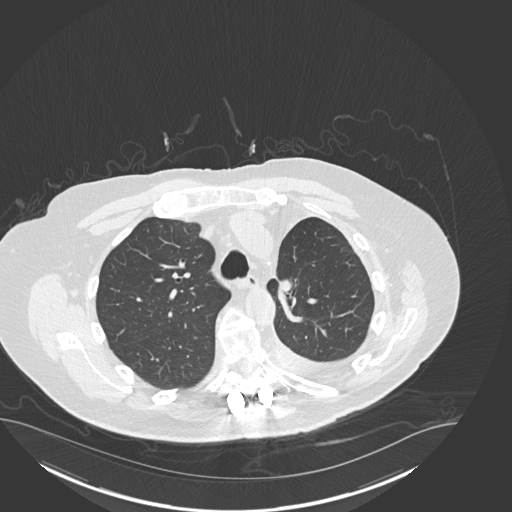
[im 117/164  mediastinal]
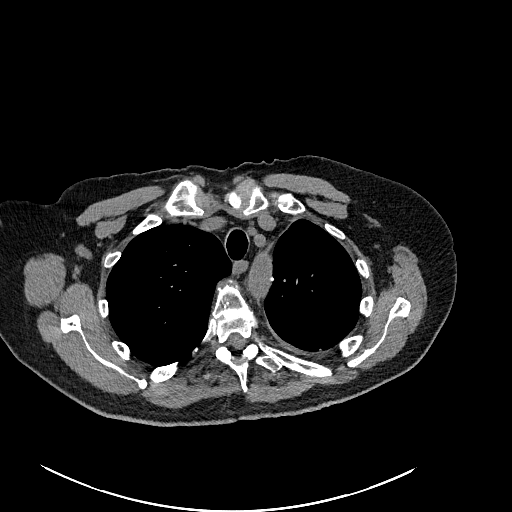
[im 117/164  lung]
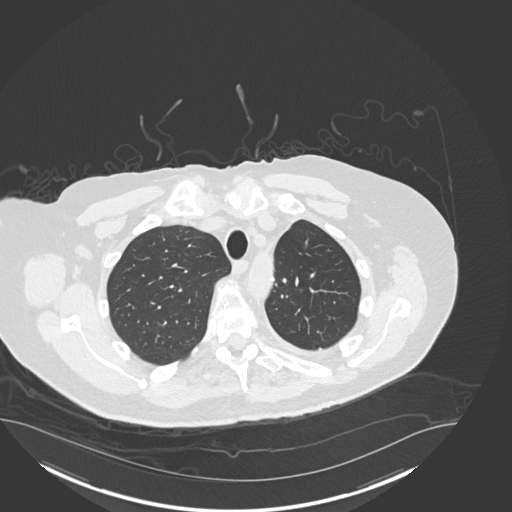
[im 129/164  lung]
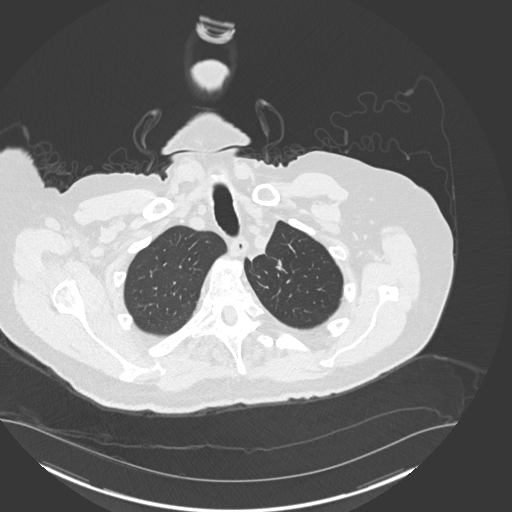
[im 140/164  lung]
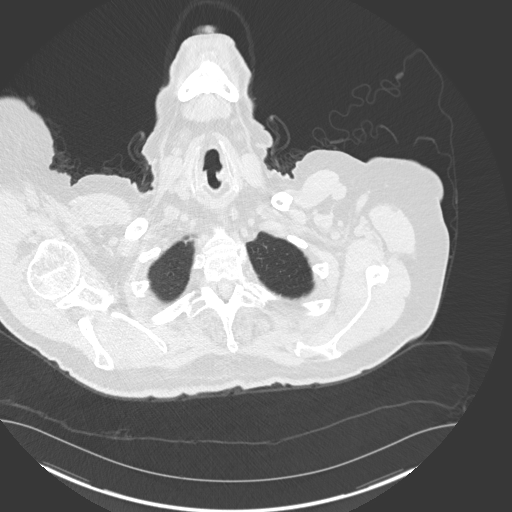
[im 152/164  lung]
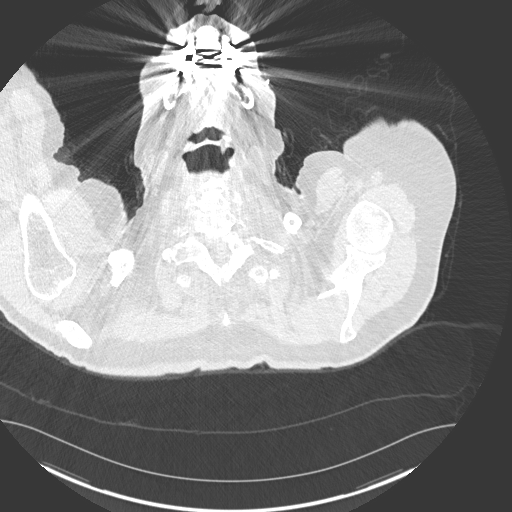

[Series 5: coronal · coronal · 0.70mm/px · 3 of 135 slices shown]
[im 27/135  lung]
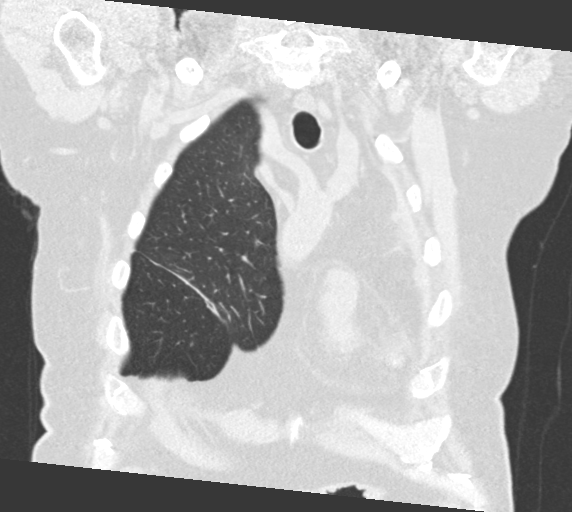
[im 54/135  lung]
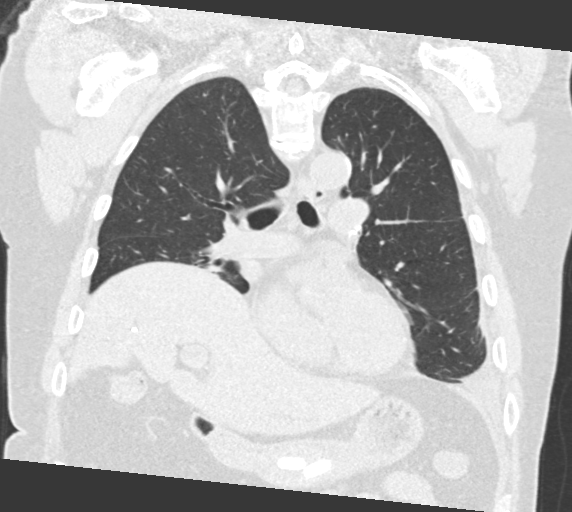
[im 81/135  lung]
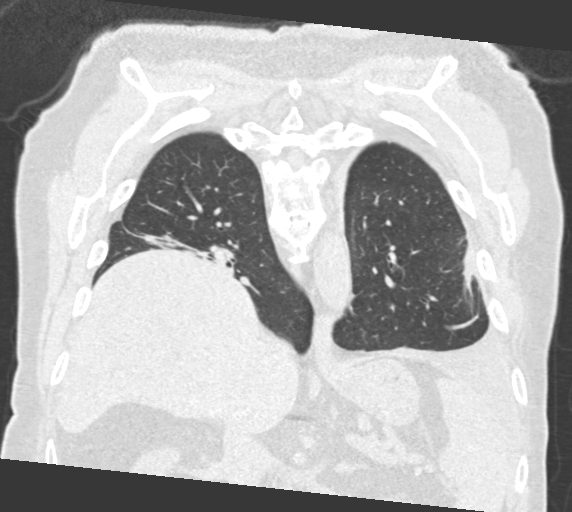

[15 of 36 positions shown; findings below may reference images not displayed]

FINDINGS: Cardiovascular: Heart size is normal. There is no significant
pericardial fluid, thickening or pericardial calcification. There is
aortic atherosclerosis, as well as atherosclerosis of the great
vessels of the mediastinum and the coronary arteries, including
calcified atherosclerotic plaque in the left main, left anterior
descending and right coronary arteries.

Mediastinum/Nodes: No pathologically enlarged mediastinal or hilar
lymph nodes. Please note that accurate exclusion of hilar adenopathy
is limited on noncontrast CT scans. Esophagus is unremarkable in
appearance. No axillary lymphadenopathy.

Lungs/Pleura: Status post left upper lobectomy. Compensatory
expansion of the left lower lobe. Moderate left pleural effusion
lying dependently and in the sub pulmonic region, stable compared to
the prior examination. No right pleural effusion. No consolidative
airspace disease. No suspicious appearing pulmonary nodules or
masses are noted. Areas of mild chronic linear scarring in the right
lung base.

Upper Abdomen: Aortic atherosclerosis. Status post cholecystectomy.
Diffuse low attenuation throughout the visualized hepatic
parenchyma, indicative of hepatic steatosis. Liver has a slightly
shrunken appearance and nodular contour, suggesting underlying
cirrhosis. Nonobstructive calculi noted within the upper pole
collecting system of the right kidney measuring up to 11 mm.

Musculoskeletal: Orthopedic rod and screw fixation hardware
throughout the lower thoracic and upper lumbar spine. There are no
aggressive appearing lytic or blastic lesions noted in the
visualized portions of the skeleton.
IMPRESSION: 1. Status post left upper lobectomy with stable postoperative
findings in the chest and no evidence to suggest local recurrence or
metastatic disease in the thorax.
2. Aortic atherosclerosis, in addition to left main and 2 vessel
coronary artery disease. Assessment for potential risk factor
modification, dietary therapy or pharmacologic therapy may be
warranted, if clinically indicated.
3. Hepatic steatosis.
4. Morphologic changes in the liver suggestive of cirrhosis.
5. Nonobstructive calculi in the upper pole collecting system
measuring up to 11 mm.

Aortic Atherosclerosis ([id number].F).

## 2020-11-02 MED ORDER — CHLORHEXIDINE GLUCONATE 0.12 % MT SOLN
OROMUCOSAL | Status: AC
  Filled 2020-11-02: qty 15

## 2020-11-02 MED ORDER — OXYCODONE HCL 5 MG PO TABS: 10.0000 mg | ORAL_TABLET | ORAL | Status: DC | PRN

## 2020-11-02 MED ORDER — ACETAMINOPHEN 325 MG PO TABS: 650.0000 mg | ORAL_TABLET | Freq: Four times a day (QID) | ORAL | Status: DC | PRN

## 2020-11-02 MED ORDER — ACETAMINOPHEN 325 MG PO TABS
650.0000 mg | ORAL_TABLET | Freq: Four times a day (QID) | ORAL | Status: DC | PRN
  Administered 2020-11-05 – 2020-11-20 (×4): 650 mg
  Filled 2020-11-02 (×5): qty 2

## 2020-11-02 MED ORDER — CLONAZEPAM 0.5 MG PO TABS
0.5000 mg | ORAL_TABLET | Freq: Two times a day (BID) | ORAL | Status: DC
  Administered 2020-11-02 – 2020-11-05 (×7): 0.5 mg
  Filled 2020-11-02 (×7): qty 1

## 2020-11-02 MED ORDER — FAMOTIDINE 20 MG PO TABS
20.0000 mg | ORAL_TABLET | Freq: Two times a day (BID) | ORAL | Status: DC
  Administered 2020-11-02 – 2020-11-16 (×27): 20 mg
  Filled 2020-11-02 (×27): qty 1

## 2020-11-02 MED ORDER — HYDROMORPHONE HCL 1 MG/ML IJ SOLN
1.0000 mg | INTRAMUSCULAR | Status: DC | PRN
  Administered 2020-11-02 – 2020-11-07 (×15): 1 mg via INTRAVENOUS
  Filled 2020-11-02 (×15): qty 1

## 2020-11-02 MED ORDER — OXYCODONE HCL 5 MG PO TABS
10.0000 mg | ORAL_TABLET | ORAL | Status: DC | PRN
  Administered 2020-11-02 – 2020-11-08 (×8): 10 mg
  Filled 2020-11-02 (×10): qty 2

## 2020-11-02 NOTE — Progress Notes (Signed)
PT Cancellation Note  Patient Details Name: Devin [name] MRN: [id number] DOB: April 05, 1941   Cancelled Treatment:    Reason Eval/Treat Not Completed: Medical issues which prohibited therapy (Pt in pain and nauseated with only movement of UEs.  Daughter at bedside.  Pt not able to tolerate evaluation today. Will check back tomorrow and progress pt as able.)   Alvira Philips 11/02/2020, 10:32 AM Lawanna Cecere M,PT Acute Rehab Services [phone] (pager)

## 2020-11-02 NOTE — Progress Notes (Signed)
2 Days Post-Op   Subjective/Chief Complaint: Responds on vent.  States his abdominal pain is the same and no worse today.   Objective: Vital signs in last 24 hours: Temp:  [97.9 F (36.6 C)-99.7 F (37.6 C)] 98.2 F (36.8 C) (05/26 0746) Pulse Rate:  [74-85] 77 (05/26 0729) Resp:  [24-31] 30 (05/26 0729) BP: (101-130)/(46-104) 114/83 (05/26 0600) SpO2:  [100 %] 100 % (05/26 0729) Arterial Line BP: (159-171)/(59-66) 171/66 (05/25 1500) FiO2 (%):  [30 %-40 %] 30 % (05/26 0729) Weight:  [93 kg] 93 kg (05/26 0500) Last BM Date: 11/01/20  Intake/Output from previous day: 05/25 0701 - 05/26 0700 In: 2452.6 [I.V.:1122.7; NG/GT:869.8] Out: 1220 [Urine:1220] Intake/Output this shift: No intake/output data recorded.  GI: soft, approp tender, nondistended, incision clean with minimal bloody drainage from inferior aspect, g tube in place  Lab Results:  Recent Labs    11/01/20 1718 11/02/20 0613  WBC 15.5* 13.7*  HGB 8.0* 7.5*  HCT 23.4* 21.9*  PLT 159 127*   BMET Recent Labs    11/01/20 0431 11/02/20 0613  NA 129* 131*  K 4.8 3.7  CL 96* 98  CO2 24 25  GLUCOSE 117* 99  BUN 38* 42*  CREATININE 1.33* 1.41*  CALCIUM 8.4* 8.0*   PT/INR Recent Labs    10/31/20 1835  LABPROT 15.8*  INR 1.3*   ABG Recent Labs    10/31/20 1649 10/31/20 1730  PHART 7.356 7.335*  HCO3 21.3 22.4    Studies/Results: DG CHEST PORT 1 VIEW  Result Date: 11/01/2020 CLINICAL DATA:  Respiratory failure EXAM: PORTABLE CHEST 1 VIEW COMPARISON:  10/31/2020 FINDINGS: Tracheostomy, left internal jugular temporary hemodialysis catheter with its tip within the superior vena cava, nasoenteric feeding tube extending into the upper abdomen, are all unchanged. Pulmonary insufflation is stable. Mild right basilar atelectasis. Small laterally loculated left pleural effusion again noted. No pneumothorax. Cardiac size within normal limits. Pulmonary vascularity is normal. No acute bone abnormality.  Thoracolumbar fusion hardware again noted IMPRESSION: Stable support lines and tubes. Stable pulmonary insufflation with right basilar atelectasis. Stable small laterally loculated left pleural effusion Electronically Signed   By: Fidela Salisbury MD   On: 11/01/2020 04:12   DG Chest Port 1 View  Result Date: 10/31/2020 CLINICAL DATA:  Trach ostomy tube change, central line insertion EXAM: PORTABLE CHEST 1 VIEW COMPARISON:  Portable exam 1525 hours compared to 10/22/2020 FINDINGS: New tracheostomy tube with tip projecting 13 mm above carina. LEFT jugular central venous catheter with tip projecting over SVC. Tip of feeding tube projects over distal gastric antrum. Normal heart size and mediastinal contours. Elevation of RIGHT diaphragm with bibasilar atelectasis and minimal LEFT pleural effusion. No definite pneumothorax. Bones demineralized with prior thoracolumbar spinal fixation. IMPRESSION: Line and tube positions as above. Bibasilar atelectasis and minimal LEFT pleural effusion. Electronically Signed   By: Lavonia Dana M.D.   On: 10/31/2020 15:43    Anti-infectives: Anti-infectives (From admission, onward)   None      Assessment/Plan: POD 2 elap/loa/gastrostomy placement followed by reoperation for hemorrhagic shock- [name] -appreciate CCM care -can use g tube, appears he has resolved hemorrhage.  hgb is downtrending but likely equilibrating as I don't see evidence for actively persistent bleeding. -can start TFs -hold heparin  JAMEIR AKE 11/02/2020

## 2020-11-02 NOTE — Discharge Summary (Signed)
Physician Discharge Summary  Patient ID: Devin [name] MRN: [id number] DOB/AGE: 78-Dec-1942 78 y.o.  Admit date: 10/12/2020 Discharge date: 10/31/2020  Discharge Diagnoses:  Principal Problem:   Thoracic compression fracture [facility]) Active Problems:   Tracheostomy dependence ([place])   Debility   Acute blood loss anemia   Controlled type 2 diabetes mellitus with hyperglycemia, with long-term current use of insulin (HCC)   Dysphagia   Hyponatremia   Nasogastric tube present   Status post tracheostomy [facility])   Urinary retention   Sleep disturbance   Discharged Condition: stable   Significant Diagnostic Studies: CT ABDOMEN WO CONTRAST  Result Date: 10/25/2020 CLINICAL DATA:  Assess anatomy for G-tube placement EXAM: CT ABDOMEN WITHOUT CONTRAST TECHNIQUE: Multidetector CT imaging of the abdomen was performed following the standard protocol without IV contrast. COMPARISON:  09/10/2020 FINDINGS: Lower chest: Small bilateral pleural effusions. Linear opacities at the lung bases likely due to atelectasis. There is airspace opacity in the right lower lobe suspicious for pneumonia. Hepatobiliary: Calcification located in the subcapsular region of the posterior right hepatic lobe may be due to prior granulomatous inflammation. The liver otherwise unremarkable. The gallbladder is absent. Pancreas: Unremarkable. No pancreatic ductal dilatation or surrounding inflammatory changes. Spleen: Normal in size without focal abnormality. Adrenals/Urinary Tract: Single punctate bilateral nonobstructing renal calculi. Kidneys and visualized segments of the ureters otherwise unremarkable. No significant abnormality of the adrenal glands. Stomach/Bowel: NG tube terminates in the distal gastric lumen. 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 No dilated bowel loops noted within the visualized abdomen. Vascular/Lymphatic: No enlarged abdominal lymph nodes. Atherosclerotic plaque seen scattered throughout the abdominal  aorta. Other: Anterior abdominal wall surgical mesh material is noted. Areas of subcutaneous fat stranding in the anterior abdominal wall subcutaneous fat likely sites of medication administration. Musculoskeletal: Diffuse osteopenia extensive posterior fusion changes of the thoracolumbar spine. Fracture noted through the inferior endplate of G01 vertebral body. IMPRESSION: 1. Surgical mesh material interposed between the stomach and anterior abdominal wall. 2. Right lower lobe airspace opacities suspicious for pneumonia. Electronically Signed   By: Miachel Roux M.D.   On: 10/25/2020 08:28   DG Chest 2 View  Result Date: 10/23/2020 CLINICAL DATA:  Hypoxia.  History of hypertension and diabetes. EXAM: CHEST - 2 VIEW COMPARISON:  Radiographs 10/10/2020 and 10/07/2020.  CT 09/10/2020. FINDINGS: Tracheostomy and feeding tube are in place. There are persistent low lung volumes with moderate bilateral pleural effusions and associated bibasilar atelectasis. There may be mild edema. No pneumothorax. The heart size and mediastinal contours are stable. Patient is status post thoracolumbar fusion with chronic fracture of the interconnecting rods bilaterally. IMPRESSION: Stable chest with persistent bilateral pleural effusions, bibasilar atelectasis and possible mild edema. Electronically Signed   By: Richardean Sale M.D.   On: 10/23/2020 08:13   DG Abd 1 View  Result Date: 10/15/2020 CLINICAL DATA:  Nasogastric tube placement. EXAM: ABDOMEN - 1 VIEW COMPARISON:  Oct 09, 2020 FINDINGS: A Dobbhoff feeding tube is seen with its weighted tip overlying the expected region of the gastric antrum/duodenal bulb. The bowel gas pattern is normal. Numerous bilateral radiopaque pedicle screws are seen throughout the thoracolumbar spine. Multiple radiopaque surgical coils are seen scattered throughout the abdomen. No radio-opaque calculi or other significant radiographic abnormality are seen. IMPRESSION: Dobbhoff feeding tube  positioning, as described above. Electronically Signed   By: Virgina Norfolk M.D.   On: 10/15/2020 16:19   DG Abd Portable 1V  Result Date: 10/25/2020 CLINICAL DATA:  Feeding tube placement EXAM: PORTABLE ABDOMEN -  1 VIEW COMPARISON:  10/15/2020 FINDINGS: Extensive spinal hardware redemonstrated. Esophageal tube tip overlies the gastroduodenal junction. Evidence of prior hernia repair. Visible gas pattern is unobstructed. IMPRESSION: Feeding tube tip overlies the gastroduodenal region. Electronically Signed   By: Donavan Foil M.D.   On: 10/25/2020 16:46   DG Swallowing Func-Speech Pathology  Result Date: 10/19/2020 Objective Swallowing Evaluation: Type of Study: MBS-Modified Barium Swallow Study  Patient Details Name: Devin [name] MRN: [id number] Date of Birth: 1941-01-20 Today's Date: 10/19/2020 Past Medical History: Past Medical History: Diagnosis Date . A-fib ([place])  . Adenocarcinoma of left lung, stage 1 ([place]) 03/10/2015 . Ankylosing spondylitis ([place])   Diagnosed during lumbar fracture summer of 2016   . Crohn's disease ([place])  . Gout  . HIstory of basal cell cancer of face   THYROID CA HX . History of kidney stones  . Hypertension  . Hypothyroidism  . Impotence  . 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 Obesity (BMI 30-39.9)  . Osteoporosis   Pt completed 5 years of fosamax in 2013    . Prostate cancer with recurrence   Treated with prostatectomy with recurrence 2012 with Lupron treatment now him  . Psoriasis  . Thyroid cancer ([place]) 1999  Treated with RAI and total thyroidectomy  . Type II diabetes mellitus ([place])  Past Surgical History: Past Surgical History: Procedure Laterality Date . ABDOMINAL EXPLORATION SURGERY    for small bowel obstruction . APPENDECTOMY  03/2007 . BACK SURGERY   . BASAL CELL CARCINOMA EXCISION  "several"  "head" . CARDIAC CATHETERIZATION  03/17/2003 . CHOLECYSTECTOMY N/A 05/07/2016  Procedure: LAPAROSCOPIC CHOLECYSTECTOMY;  Surgeon: Fanny Skates, MD;   Location: [place];  Service: General;  Laterality: N/A; . COLON SURGERY  03/2007  Resection of cecum, appendix, terminal ileum (approximately/notes 10/10/2010 . CYSTOSCOPY/URETEROSCOPY/HOLMIUM LASER/STENT PLACEMENT Right 07/03/2020  Procedure: CYSTOSCOPY/RETROGRADE/URETEROSCOPY REMOVAL OF BLADDER STONE;  Surgeon: Raynelle Bring, MD;  Location: [facility];  Service: Urology;  Laterality: Right; . DIRECT LARYNGOSCOPY N/A 10/04/2020  Procedure: DIRECT LARYNGOSCOPY, PHARYNGOSCOPY WITH BIOPSY;  Surgeon: [name], DO;  Location: [place];  Service: ENT;  Laterality: N/A; . ESOPHAGOSCOPY N/A 10/04/2020  Procedure: ESOPHAGOSCOPY WITH BIOPSY;  Surgeon: [name], DO;  Location: [place];  Service: ENT;  Laterality: N/A; . HERNIA REPAIR   . LAMINECTOMY WITH POSTERIOR LATERAL ARTHRODESIS LEVEL 1 N/A 09/15/2020  Procedure: REVISION OF THORACOLUMBAR FUSION, ADDITION OF CROSS-CONNECTORS;  Surgeon: Eustace Moore, MD;  Location: [place];  Service: Neurosurgery;  Laterality: N/A; . LAPAROSCOPIC CHOLECYSTECTOMY  05/07/2016 . LAPAROSCOPIC LYSIS OF ADHESIONS  05/07/2016 . LAPAROSCOPIC LYSIS OF ADHESIONS N/A 05/07/2016  Procedure: LAPAROSCOPIC LYSIS OF ADHESIONS TIMES ONE HOUR;  Surgeon: Fanny Skates, MD;  Location: [place];  Service: General;  Laterality: N/A; . POSTERIOR FUSION THORACIC SPINE  02/08/2016  1. Posterior thoracic arthrodesis T7-T11 utilizing morcellized allograft, 2. Posterior thoracic segmental fixation T7-T11 utilizing nuvasive pedicle screws . PROSTATECTOMY  06/2001  w/bilateral pelvic lymph nose dissection/notes 10/24/2010 . SPINAL FUSION  12/2014  Open reduction internal fixation of L1 Chance fracture with posterior fusion T10-L4 utilizing morcellized allograft and some local autograft, segmental instrumentation T10-L4 inclusive utilizing nuvasive pedicle screws/notes 12/16/2014 . Stress Cardiolite  02/17/2003 . THOROCOTOMY WITH LOBECTOMY  03/16/2015  Procedure: THOROCOTOMY WITH LOBECTOMY;  Surgeon: Ivin Poot, MD;   Location: [place];  Service: Thoracic;; . TONSILLECTOMY   . TOTAL THYROIDECTOMY  1997 . Venous Doppler  05/30/2004 . VENTRAL HERNIA REPAIR  04/14/2008 . VIDEO ASSISTED THORACOSCOPY Left 03/16/2015  Procedure: VIDEO ASSISTED THORACOSCOPY;  Surgeon: Ivin Poot, MD;  Location: [place];  Service: Thoracic;  Laterality: Left; HPI: See H&P  Subjective: alert Assessment / Plan / Recommendation CHL IP CLINICAL IMPRESSIONS 10/19/2020 Clinical Impression Patient demonstrates minimal functional change since initial MBS on 09/29/20. Patient continues to present with a severe pharyngeal dysphagia that appears related to a thickening of the preverterbral tissue at the level of C4-C5.  This results in decreased epiglottic closure and redirects the bolus into the laryngeal vestibule resulting in penetration and eventual aspiration. With PMV in place and extra time and effort, the patient had a moderately strong cough response and ejected most of the barium effectively; however, it tended to fall immediately back into the larynx. Postural changes were not effective in reducing penetration or aspiration.  Discussed results with Dr. Naaman Plummer. Recommend continue NPO and suspect need for long-term alternative means of nutrition.  SLP will continue to follow. SLP Visit Diagnosis Dysphagia, oropharyngeal phase (R13.12) Attention and concentration deficit following -- Frontal lobe and executive function deficit following -- Impact on safety and function Severe aspiration risk   CHL IP TREATMENT RECOMMENDATION 10/19/2020 Treatment Recommendations Therapy as outlined in treatment plan below   Prognosis 10/19/2020 Prognosis for Safe Diet Advancement Fair Barriers to Reach Goals -- Barriers/Prognosis Comment -- CHL IP DIET RECOMMENDATION 10/19/2020 SLP Diet Recommendations NPO;Alternative means - long-term Liquid Administration via -- Medication Administration Via alternative means Compensations -- Postural Changes --   CHL IP OTHER RECOMMENDATIONS  10/19/2020 Recommended Consults -- Oral Care Recommendations Oral care QID Other Recommendations --   CHL IP FOLLOW UP RECOMMENDATIONS 10/19/2020 Follow up Recommendations Inpatient Rehab   CHL IP FREQUENCY AND DURATION 10/19/2020 Speech Therapy Frequency (ACUTE ONLY) min 3x week Treatment Duration 3 weeks      CHL IP ORAL PHASE 10/19/2020 Oral Phase Impaired Oral - Pudding Teaspoon -- Oral - Pudding Cup -- Oral - Honey Teaspoon -- Oral - Honey Cup -- Oral - Nectar Teaspoon Holding of bolus;Delayed oral transit Oral - Nectar Cup -- Oral - Nectar Straw -- Oral - Thin Teaspoon -- Oral - Thin Cup -- Oral - Thin Straw -- Oral - Puree -- Oral - Mech Soft -- Oral - Regular -- Oral - Multi-Consistency -- Oral - Pill -- Oral Phase - Comment --  CHL IP PHARYNGEAL PHASE 10/19/2020 Pharyngeal Phase Impaired Pharyngeal- Pudding Teaspoon -- Pharyngeal -- Pharyngeal- Pudding Cup -- Pharyngeal -- Pharyngeal- Honey Teaspoon -- Pharyngeal -- Pharyngeal- Honey Cup -- Pharyngeal -- Pharyngeal- Nectar Teaspoon Delayed swallow initiation-vallecula;Reduced epiglottic inversion;Reduced airway/laryngeal closure;Penetration/Aspiration during swallow;Penetration/Apiration after swallow;Moderate aspiration;Pharyngeal residue - pyriform;Pharyngeal residue - valleculae Pharyngeal -- Pharyngeal- Nectar Cup -- Pharyngeal -- Pharyngeal- Nectar Straw -- Pharyngeal -- Pharyngeal- Thin Teaspoon -- Pharyngeal -- Pharyngeal- Thin Cup -- Pharyngeal -- Pharyngeal- Thin Straw -- Pharyngeal -- Pharyngeal- Puree NT Pharyngeal -- Pharyngeal- Mechanical Soft -- Pharyngeal -- Pharyngeal- Regular -- Pharyngeal -- Pharyngeal- Multi-consistency -- Pharyngeal -- Pharyngeal- Pill -- Pharyngeal -- Pharyngeal Comment --  No flowsheet data found. PAYNE, COURTNEY 10/19/2020, 10:00 AM      Weston Anna, MA, CCC-SLP [phone]           Labs:   CBC: Lab 10/30/20 0617  WBC 12.3*  NEUTROABS 10.2*  HGB 9.7*  HCT 31.8*  MCV 97.5  PLT 266     Brief HPI:   ADHVIK CANADY is a 80 y.o. male with history of HTN, T2DM, Crohn's disease, A. fib, lung cancer/P LUL lobectomy, restrictive lung disease who  was originally admitted on 09/10/20 with acute on chronic hypercarbic respiratory failure, T11 Chance fracture and agitation.  He underwent posterior thoracic fusion T11-T12 on 04/08 by Dr. Ronnald Ramp and had difficulty tolerating extubation and ultimately required tracheostomy on 04/12.  He had issues with significant diarrhea requiring rectal tube due to inability to take his [place] via core track.  He was extubated to ATC but continued to have issues with copious secretion as well as dysphagia.  He was found to have supraglottic mass on MBS and underwent biopsy of this mass which was negative for malignancy.  He was admitted to CIR briefly on 04/29 for comprehensive inpatient rehab but pulled out his feeding tube on 04/30 with significant hypoglycemia due to difficulty placing feeding tube as well as hypoxia with concerns of aspiration pneumonia.  He was transferred to acute services started on both broad-spectrum antibiotics as well as replacement of feeding tube.  On 05/03 had difficulty passing suction via trach due to obstruction and underwent bronchoscopy with placement of XLT's [place].  CT neck done for work-up and showed improvement in retropharyngeal fullness on the right, negative for subglottic stenosis as well as moderate large pleural effusion which was monitored as patient without symptoms.  Pulmonary recommended keeping XLT in place for 2 weeks to allow for irritation to resolve and have ENT reevaluate in the OR prior to decannulation.  Procalcitonin was trending down antibiotics were narrowed to Zosyn.  Tube feeds changed to Glucerna to help with calorie intake.  Therapies were resumed and patient continued to be limited by generalized weakness with back pain, BLE weakness as well as balance deficits.  He was admitted to CIR to resume his rehab course.   Hospital  Course: RACHAEL ZAPANTA was admitted to rehab 10/12/2020 for inpatient therapies to consist of PT, ST and OT at least three hours five days a week. Past admission physiatrist, therapy team and rehab RN have worked together to provide customized collaborative inpatient rehab.  At admission patient required mod to total assist with ADL task and mod assist with mobility.  He exhibited hoarse low voice intensity with throat clearing and coughing after each trial of p.o. ice chips therefore kept NPO.  Chest vest was added to help with secretions which were greatly improving.  Water flushes were increased due to acute renal failure with hyperkalemia.  Tube feeds were changed to Osmolite to help manage hyperkalemia.  Continued to have persistent hyponatremia with sodium ranging from 127-130.  CBC done showed acute blood loss anemia to be stable and he continues to have intermittent rising white count without any signs of infection.  He was maintained on Eliquis twice daily for secondary stroke prevention.  Heart rate was monitored on 3 times daily basis and was controlled on atenolol and Cardizem. Alprazolam bid prn was used to help manage elevated anxiety levels  Team as well as his family have provided ego support during his stay.  Trazodone and  Melatonin were added to help with sleep-wake disruption.    Diabetes has been monitored with ac/hs CBG checks and SSI was use prn for tighter BS control.  Rectal tube was kept in place initially due to ongoing issues with diarrhea.  Probiotics, Questran and fiber was added to help with bulking and Flexi-Seal was removed on 05/14.   Foley was discontinued and voiding was monitored with PVR checks with in and out caths.  On 05/16 he was noted to have urinary retention with inability to pass Foley.  Dr. Junious Silk was consulted for assistance and 59 French Foley catheter was placed with recommendations to keep this in place for at least 5 days and resume with use of at least 16 or  18 [place] coud to prevent false passages.  PCCM has been following intermittently for empiric on respiratory status and as his respiratory status was improving he was started on capping trials on 05/19.   Repeat swallow study showed no improvement in swallow function and PEG tube was discussed with patient and family.  After family's discussion with primary GI, they were agreeable to have feeding tube placed.  Interventional radiology was consulted for assistance but due to mesh between stomach and anterior abdominal wall, surgical placement was recommended.  General surgery felt that patient would need open G-tube and recommended cardiac clearance prior to procedure.  Cardiology felt that patient was at moderate risk given history of PAF and debilitated state.  Eliquis was discontinued and he was transitioned to IV heparin.  During his stay in rehab patient had progressed to mod to total assist for ADL tasks.  He required contact-guard assist for transfers and  to ambulate 15 feet with rolling walker.  He was discharged to acute services 05/24 for open placement of gastrostomy tube.  Diet: NPO   Medications at discharge (via feeding tube): 1.  Alprazolam 0.5 mg twice daily as needed 2.  Atenolol 25 mg twice daily 3. IV heparin per pharmacy 4.  Pulmicort 0.5 mg nebulized twice daily 5.  Peridex for oral care twice daily 6.  Questran 4 g 4 times daily 7.  Cardizem 30 mg every 8 hours 8.  Cardura 1 mg daily with supper 9.  Osmolite 1.5 cal 75 cc an hour to be held 4 hours as needed for therapy 10.  Prosource 45 mL twice daily 11.  Water flushes to 25 mL every 4 hours 12.  Gerhardt's Butt cream twice daily to sacrum 13.  Hydrocodone 5-3 25 every 4 hours as needed severe pain 14.  Levothyroxine 125 mcg daily before breakfast 15.  Imodium 1 mg 4 times daily 16.  Juven 1 packet twice daily 17.  Florastor to 50 mg twice daily 18.  Tramadol 25 mg every 6 hours as needed moderate pain 19.  Trazodone  75 mg nightly  Discharge disposition: 02-Transferred to [facility]   Signed: Bary Leriche 11/02/2020, 8:42 AM

## 2020-11-02 NOTE — Progress Notes (Signed)
NAME:  Devin [name], MRN:  [id number], DOB:  December 17, 1940, LOS: 2 ADMISSION DATE:  10/31/2020, CONSULTATION DATE:  5/3 REFERRING MD:  Verlon Au, CHIEF COMPLAINT:  Dyspnea   History of Present Illness:  80 y/o had a fall and spinal fracture with post admission course complicated by prolonged mechanical ventilation and 2 extubation failures, ultimately requiring a tracheostomy.  There was concern for supra and sub-glottic airway narrowing.  XL tracheostomy ultimately placed on 5/3.  Moved to Rehab and has been working with them there.  On 5/24 [name] underwent PEG tube placement in the OR with Dr. Donne Hazel without incident. PCCM was called to PACU for admission for post-op hypotension. ABG 7.25/68/144/30. HGB on iStat 6.2. HGB post op 7.1. On 195 phenylephrine. PCCM to admit.  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 Was able to bypass with distal XLT. 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 Voice quality better. Feeling stronger   5/16 foley placed by urology. working with OT, talking   5/19 capping trials with tracheostomy  5/24 PEG placement in OR. PCCM reconsulted on for hypotension. 6.0 XLTD cuffless swithced to 6.0 XLTD cuffed. CVC.  Went to OR for ex lap, washout, ligation of omental artery bleed  5/25 stabilized, start PS  trials  Interim History / Subjective:  No events.  Objective   Blood pressure 114/83, pulse 77, temperature 97.9 F (36.6 C), temperature source Oral, resp. rate (!) 30, weight 93 kg, SpO2 100 %.    Vent Mode: PRVC FiO2 (%):  [30 %-40 %] 30 % Set Rate:  [30 bmp] 30 bmp Vt Set:  [600 mL] 600 mL PEEP:  [5 cmH20] 5 cmH20 Plateau Pressure:  [26 cmH20-28 cmH20] 26 cmH20   Intake/Output Summary (Last 24 hours) at 11/02/2020 2878 Last data filed at 11/02/2020 0600 Gross per 24 hour  Intake 2555.69 ml  Output 1220 ml  Net 1335.69 ml   Filed Weights   11/01/20 0500 11/02/20 0500  Weight: 88.2 kg 93 kg    Examination: Constitutional: chronically ill appearing man on vent  Eyes: EOMI, pupils equal Ears, nose, mouth, and throat: trach in place, minimal secretions Cardiovascular: sounds regular but afib on monitor Respiratory: scattered rhonci worse on left, no accessory muscle use Gastrointestinal: some peri-incisional induration, PEG in place, moderate TTP mostly around ex lap site Skin: No rashes, normal turgor Neurologic: moves all 4 ext to command Psychiatric: RASS 0   Labs/imaging that I havepersonally reviewed  (right click and "Reselect all SmartList Selections" daily)  BMP pending  Hgb 7>>1 PRBC 2 FFP>>10>>8>>7.5 INR 1.3 CBG okay  Resolved Hospital Problem list     Assessment & Plan:  Acute on Chronic Respiratory Failure with Hypercarbia 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: repeat ENT scope 5/25 showing no obstruction but "had copious pooled secretions in bilateral pyriform recesses as well as silent aspiration." Placed back on vent in setting of ABLA and hemorrhagic shock 5/24 - Work  toward TC today  Hemorrhagic shock- due to post-PEG omental bleed s/p washout, ongoing pain issues Hyperkalemia- resolved - Scaled pain meds as ordered - Serial monitoring of peri-incisional induration  New PEG Placement S/P placment in OR on 5/24 by  Donne Hazel MD - Okay to use, remove cortrak  DM2- variable sugars - SSI  Hx Afib on AC PTA - Continue to hold [facility] for until hgb stabilizes   Deconditioning- re-engage PT/OT   Best practice (right click and "Reselect all SmartList Selections" daily)  Diet:  TF Pain/Anxiety/Delirium protocol (if indicated): graded VAP protocol (if indicated): Yes DVT prophylaxis: SCDs GI prophylaxis: PPI Glucose control:  SSI yes Central venous access:  Needs Arterial line:  Removed Foley:  Yes, and needs: urology placed Mobility:  bedrest PT consulted: yes Last date of multidisciplinary goals of care discussion 5/24, see Dr. Anastasia Pall note Code Status:  DNR Disposition: ICU  Patient critically ill due to respiratory failure and ABLA Interventions to address this today ventilator weaning Risk of deterioration without these interventions is high  I personally spent 33 minutes providing critical care not including any separately billable procedures  Erskine Emery MD [facility]  Prefer epic messenger for cross cover needs If after hours, please call E-link

## 2020-11-02 NOTE — Progress Notes (Signed)
OT Cancellation Note  Patient Details Name: Devin [name] MRN: [id number] DOB: 1941/05/08   Cancelled Treatment:    Reason Eval/Treat Not Completed: Medical issues which prohibited therapy. Patient unable to tolerate evaluation at this time. OT will attempt again tomorrow.   Gloris Manchester OTR/L Supplemental OT, Department of rehab services [phone]  Zimir Kittleson R H. 11/02/2020, 10:38 AM

## 2020-11-03 DIAGNOSIS — Z9911 Dependence on respirator [ventilator] status: Secondary | ICD-10-CM

## 2020-11-03 LAB — BASIC METABOLIC PANEL
Anion gap: 6 (ref 5–15)
BUN: 32 mg/dL — ABNORMAL HIGH (ref 8–23)
CO2: 22 mmol/L (ref 22–32)
Calcium: 7.8 mg/dL — ABNORMAL LOW (ref 8.9–10.3)
Chloride: 102 mmol/L (ref 98–111)
Creatinine, Ser: 1.24 mg/dL (ref 0.61–1.24)
GFR, Estimated: 60 mL/min — ABNORMAL LOW (ref >60)
Glucose, Bld: 133 mg/dL — ABNORMAL HIGH (ref 70–99)
Potassium: 3.6 mmol/L (ref 3.5–5.1)
Sodium: 130 mmol/L — ABNORMAL LOW (ref 135–145)

## 2020-11-03 LAB — CBC
HCT: 21.5 % — ABNORMAL LOW (ref 39.0–52.0)
HCT: 21.3 % — ABNORMAL LOW (ref 39.0–52.0)
Hemoglobin: 7.1 g/dL — ABNORMAL LOW (ref 13.0–17.0)
Hemoglobin: 7.2 g/dL — ABNORMAL LOW (ref 13.0–17.0)
MCH: 30.9 pg (ref 26.0–34.0)
MCH: 31.3 pg (ref 26.0–34.0)
MCHC: 33 g/dL (ref 30.0–36.0)
MCHC: 33.8 g/dL (ref 30.0–36.0)
MCV: 93.5 fL (ref 80.0–100.0)
MCV: 92.6 fL (ref 80.0–100.0)
Platelets: 134 10*3/uL — ABNORMAL LOW (ref 150–400)
Platelets: 130 10*3/uL — ABNORMAL LOW (ref 150–400)
RBC: 2.3 MIL/uL — ABNORMAL LOW (ref 4.22–5.81)
RBC: 2.3 MIL/uL — ABNORMAL LOW (ref 4.22–5.81)
RDW: 16 % — ABNORMAL HIGH (ref 11.5–15.5)
RDW: 16.1 % — ABNORMAL HIGH (ref 11.5–15.5)
WBC: 11.7 10*3/uL — ABNORMAL HIGH (ref 4.0–10.5)
WBC: 13.4 10*3/uL — ABNORMAL HIGH (ref 4.0–10.5)
nRBC: 0 % (ref 0.0–0.2)
nRBC: 0 % (ref 0.0–0.2)

## 2020-11-03 LAB — GLUCOSE, CAPILLARY
Glucose-Capillary: 110 mg/dL — ABNORMAL HIGH (ref 70–99)
Glucose-Capillary: 121 mg/dL — ABNORMAL HIGH (ref 70–99)
Glucose-Capillary: 123 mg/dL — ABNORMAL HIGH (ref 70–99)
Glucose-Capillary: 124 mg/dL — ABNORMAL HIGH (ref 70–99)
Glucose-Capillary: 130 mg/dL — ABNORMAL HIGH (ref 70–99)
Glucose-Capillary: 131 mg/dL — ABNORMAL HIGH (ref 70–99)
Glucose-Capillary: 161 mg/dL — ABNORMAL HIGH (ref 70–99)

## 2020-11-03 LAB — MAGNESIUM: Magnesium: 1.8 mg/dL (ref 1.7–2.4)

## 2020-11-03 MED ORDER — MAGNESIUM SULFATE 2 GM/50ML IV SOLN
2.0000 g | Freq: Once | INTRAVENOUS | Status: AC
  Administered 2020-11-03: 2 g via INTRAVENOUS
  Filled 2020-11-03: qty 50

## 2020-11-03 MED ORDER — POTASSIUM CHLORIDE 20 MEQ PO PACK
40.0000 meq | PACK | Freq: Once | ORAL | Status: AC
  Administered 2020-11-03: 40 meq
  Filled 2020-11-03: qty 2

## 2020-11-03 MED ORDER — ARFORMOTEROL TARTRATE 15 MCG/2ML IN NEBU
15.0000 ug | INHALATION_SOLUTION | Freq: Two times a day (BID) | RESPIRATORY_TRACT | Status: DC
  Administered 2020-11-03 – 2020-11-20 (×34): 15 ug via RESPIRATORY_TRACT
  Filled 2020-11-03 (×35): qty 2

## 2020-11-03 NOTE — Progress Notes (Signed)
3 Days Post-Op   Subjective/Chief Complaint: Awake on vent.  Pain is stable to slightly improved.  Tolerating TFs at goal rate of 70cc/hr   Objective: Vital signs in last 24 hours: Temp:  [97.6 F (36.4 C)-98.7 F (37.1 C)] 97.8 F (36.6 C) (05/27 0746) Pulse Rate:  [71-90] 90 (05/27 0916) Resp:  [22-32] 30 (05/27 0731) BP: (84-153)/(50-81) 111/75 (05/27 0600) SpO2:  [100 %] 100 % (05/27 0916) FiO2 (%):  [30 %] 30 % (05/27 0916) Weight:  [91.9 kg] 91.9 kg (05/27 0500) Last BM Date: 11/01/20  Intake/Output from previous day: 05/26 0701 - 05/27 0700 In: 1130.2 [I.V.:30; NG/GT:900.2] Out: 1980 [Urine:1980] Intake/Output this shift: No intake/output data recorded.  GI: soft, approp tender, nondistended, incision clean with minimal bloody drainage from inferior aspect, g tube in place  Lab Results:  Recent Labs    11/02/20 2325 11/03/20 0344  WBC 11.7* 13.4*  HGB 7.1* 7.2*  HCT 21.5* 21.3*  PLT 134* 130*   BMET Recent Labs    11/02/20 0613 11/03/20 0344  NA 131* 130*  K 3.7 3.6  CL 98 102  CO2 25 22  GLUCOSE 99 133*  BUN 42* 32*  CREATININE 1.41* 1.24  CALCIUM 8.0* 7.8*   PT/INR Recent Labs    10/31/20 1835  LABPROT 15.8*  INR 1.3*   ABG Recent Labs    10/31/20 1649 10/31/20 1730  PHART 7.356 7.335*  HCO3 21.3 22.4    Studies/Results: No results found.  Anti-infectives: Anti-infectives (From admission, onward)   None      Assessment/Plan: POD 3 elap/loa/gastrostomy placement followed by reoperation for hemorrhagic shock- Devin [name] -appreciate CCM care -g-tube being used and tolerating well -abdominal exam is stable -hgb stable at 7.2 this am.  Given small oozing at wound and nature of post op course, would continue to hold heparin currently.  Devin [name] 11/03/2020

## 2020-11-03 NOTE — Progress Notes (Signed)
NAME:  Devin [name], MRN:  [id number], DOB:  07/13/1940, LOS: 3 ADMISSION DATE:  10/31/2020, CONSULTATION DATE:  5/3 REFERRING MD:  Verlon Au, CHIEF COMPLAINT:  Dyspnea   History of Present Illness:  80 y/o had a fall and spinal fracture with post admission course complicated by prolonged mechanical ventilation and 2 extubation failures, ultimately requiring a tracheostomy.  There was concern for supra and sub-glottic airway narrowing.  XL tracheostomy ultimately placed on 5/3.  Moved to Rehab and has been working with them there.  On 5/24 [name] underwent PEG tube placement in the OR with Dr. Donne Hazel without incident. PCCM was called to PACU for admission for post-op hypotension. ABG 7.25/68/144/30. HGB on iStat 6.2. HGB post op 7.1. On 195 phenylephrine. PCCM to admit.  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 Was able to bypass with distal XLT. 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 Voice quality better. Feeling stronger   5/16 foley placed by urology. working with OT, talking   5/19 capping trials with tracheostomy  5/24 PEG placement in OR. PCCM reconsulted on for hypotension. 6.0 XLTD cuffless swithced to 6.0 XLTD cuffed. CVC.  Went to OR for ex lap, washout, ligation of omental artery bleed  5/25 stabilized, start PS  trials  Interim History / Subjective:  Tolerated trach collar yesterday. On vent overnight  Objective   Blood pressure 111/75, pulse 90, temperature 99.3 F (37.4 C), temperature source Oral, resp. rate (!) 30, weight 91.9 kg, SpO2 100 %.    Vent Mode: PRVC FiO2 (%):  [30 %] 30 % Set Rate:  [30 bmp] 30 bmp Vt Set:  [600 mL] 600 mL PEEP:  [5 cmH20] 5 cmH20 Plateau Pressure:  [19 cmH20-28 cmH20] 28 cmH20   Intake/Output Summary (Last 24 hours) at 11/03/2020 1416 Last data filed at 11/03/2020 0700 Gross per 24 hour  Intake 1020.17 ml  Output 1480 ml  Net -459.83 ml   Filed Weights   11/01/20 0500 11/02/20 0500 11/03/20 0500  Weight: 88.2 kg 93 kg 91.9 kg   Physical Exam: General: Elderly, well-appearing, no acute distress HENT: [place], AT, OP clear, MMM Eyes: EOMI, no scleral icterus Neck: Trach in place, c/d/i Respiratory: Clear to auscultation bilaterally.  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: repeat ENT scope 5/25 showing no obstruction but "had copious pooled secretions in bilateral pyriform recesses as well as silent aspiration." Placed back on vent in setting  of ABLA and hemorrhagic shock 5/24 - Trach collar trial as tolerated - Mechanical ventilation overnight - VAP  Hemorrhagic shock- due to post-PEG omental bleed s/p washout, ongoing pain issues - Trend CBC - Transfuse for Hg goal >7 -  Hold heparin per surgery due to oozing - Serial monitoring of peri-incisional induration  DM2- variable sugars - SSI  Hx Afib on AC PTA - Continue to hold [facility] for until hgb stabilizes   Anxiety - Klonopin 0.5 mg BID  Hypothyroidism - Synthroid 125 mcg daily  Deconditioning- PT/OT   Best practice (right click and "Reselect all SmartList Selections" daily)  Diet:  TF Pain/Anxiety/Delirium protocol (if indicated): N/A VAP protocol (if indicated): Yes DVT prophylaxis: SCDs GI prophylaxis: PPI Glucose control:  SSI yes Central venous access:  Needs Arterial line:  Removed Foley:  Yes, and needs: urology placed Mobility:  bedrest PT consulted: yes Last date of multidisciplinary goals of care discussion 5/24, see Dr. Anastasia Pall note Code Status:  DNR Disposition: ICU  The patient is critically ill with respiratory failure requiring ventilator support requiring frequent assessment and titration of therapies, application of advanced monitoring technologies and extensive interpretation of multiple databases.  Independent Critical Care Time: 38 Minutes.   Rodman Pickle, M.D. [facility] Pulmonary/Critical Care Medicine 11/03/2020 2:16 PM   Please see Amion for pager number to reach on-call Pulmonary and Critical Care Team.

## 2020-11-03 NOTE — Evaluation (Signed)
Physical Therapy Evaluation Patient Details Name: CLEMENTE DEWEY MRN: [id number] DOB: 1940/08/29 Today's Date: 11/03/2020   History of Present Illness  Pt is a 80 yo male admitted from [place] 5/24 to ICU after PEG placement and hypotension with need for repeat exploration and evacuation. Pt remained on vent post op. Pt with complex PMHx recently admitted 09/10/20-10/06/20 after fall with T11 chance fx and L1 compression fx s/p PLIF T11-12 on 4/8. Pt with complex respiratory failure requiring trach 4/12. Pt then transferred to CIR 10/06/20; readmitted to [facility] acute care on 10/07/20 for low blood sugars. Other PMH includes HTN, gout, afib, ankylosing spondylitis, osteoporosis, prostate CA, DM2.  Clinical Impression  Pt awake and able to respond with head nods and limited mouthing of words. Pt reporting fatigue and pain but unable to localize. Pt refused donning brace or attempting to stand today but able to sit EOB 8 min. Pt with decreased strength, transfers, gait and function who will benefit from acute therapy to maximize mobility, safety and function with hopeful return to CIR.   PRVC 30% fio2, peep 5 HR 90 SpO2 100% BP 104/62 (74)    Follow Up Recommendations CIR;Supervision for mobility/OOB    Equipment Recommendations  3in1 (PT);Rolling walker with 5" wheels    Recommendations for Other Services       Precautions / Restrictions Precautions Precautions: Fall;Back;Other (comment) Precaution Comments: vent, trach, PEG, abdominal incision, TLSO Required Braces or Orthoses: Spinal Brace Spinal Brace: Thoracolumbosacral orthotic;Applied in sitting position      Mobility  Bed Mobility Overal bed mobility: Needs Assistance Bed Mobility: Rolling;Sidelying to Sit;Sit to Sidelying Rolling: Mod assist Sidelying to sit: Mod assist;+2 for physical assistance     Sit to sidelying: Mod assist;+2 for physical assistance General bed mobility comments: physical assist with cues to roll to right  and rise from side with assist to clear legs off surface. Return to supine with assist to control trunk and bring legs to surface. Pt EOB grossly 8 min but denied donning brace in sitting. lateral scoot at EOB x 3 with max +2 assist with cues for sequence and physical assist    Transfers                 General transfer comment: pt declined attempting  Ambulation/Gait                Stairs            Wheelchair Mobility    Modified Rankin (Stroke Patients Only)       Balance Overall balance assessment: Needs assistance Sitting-balance support: Feet supported;No upper extremity supported Sitting balance-Leahy Scale: Fair Sitting balance - Comments: EOB 8 min without physical assist                                     Pertinent Vitals/Pain Faces Pain Scale: Hurts little more Pain Location: generalized grimace does not endorse back or abdominal pain but discomfort Pain Descriptors / Indicators: Grimacing Pain Intervention(s): Monitored during session;Repositioned;Premedicated before session;Limited activity within patient's tolerance    Home Living Family/patient expects to be discharged to:: Inpatient rehab Living Arrangements: Spouse/significant other Available Help at Discharge: Family;Available 24 hours/day Type of Home: House Home Access: Stairs to enter   [employer] of Steps: 3 Home Layout: One level Home Equipment: Walker - 2 wheels;Bedside commode      Prior Function Level of Independence: Needs assistance  Gait / Transfers Assistance Needed: was independent and caregiver for spouse prior to fall and fx. Since that time has required assist and working toward gait on CIR up to 80' min assist  ADL's / Homemaking Assistance Needed: assist for all ADLs since fall        Hand Dominance        Extremity/Trunk Assessment   Upper Extremity Assessment Upper Extremity Assessment: Defer to OT evaluation    Lower  Extremity Assessment Lower Extremity Assessment: Generalized weakness RLE Deficits / Details: grossly 2/5 with pt not fully participating in movement with assist for all movement of bil LE LLE Deficits / Details: grossly 2/5 with pt not fully participating in movement with assist for all movement of bil LE    Cervical / Trunk Assessment Cervical / Trunk Assessment: Other exceptions Cervical / Trunk Exceptions: s/p back sx and abdominal wound  Communication   Communication: Tracheostomy  Cognition Arousal/Alertness: Awake/alert Behavior During Therapy: Flat affect Overall Cognitive Status: Difficult to assess Area of Impairment: Following commands                       Following Commands: Follows one step commands inconsistently;Follows one step commands with increased time       General Comments: pt with flat affect able to state name, not oriented to time. Pt with grimace and difficulty stating what is bothering him but fatigued with EOB and unable to tolerate donning brace or further mobility      General Comments      Exercises General Exercises - Lower Extremity Heel Slides: AAROM;Both;10 reps;Supine   Assessment/Plan    PT Assessment Patient needs continued PT services  PT Problem List Decreased strength;Decreased activity tolerance;Decreased balance;Decreased mobility;Pain;Decreased range of motion;Decreased coordination;Decreased knowledge of use of DME;Decreased safety awareness;Decreased knowledge of precautions;Decreased skin integrity;Cardiopulmonary status limiting activity       PT Treatment Interventions Gait training;DME instruction;Functional mobility training;Therapeutic activities;Therapeutic exercise;Balance training;Patient/family education;Stair training;Neuromuscular re-education    PT Goals (Current goals can be found in the Care Plan section)  Acute Rehab PT Goals Patient Stated Goal: pt agreeable to work with therapy but did not state  goal PT Goal Formulation: With patient Time For Goal Achievement: 11/17/20 Potential to Achieve Goals: Fair    Frequency Min 3X/week   Barriers to discharge Decreased caregiver support      Co-evaluation PT/OT/SLP Co-Evaluation/Treatment: Yes Reason for Co-Treatment: Complexity of the patient's impairments (multi-system involvement);For patient/therapist safety PT goals addressed during session: Mobility/safety with mobility         AM-PAC PT "6 Clicks" Mobility  Outcome Measure Help needed turning from your back to your side while in a flat bed without using bedrails?: Total Help needed moving from lying on your back to sitting on the side of a flat bed without using bedrails?: Total Help needed moving to and from a bed to a chair (including a wheelchair)?: Total Help needed standing up from a chair using your arms (e.g., wheelchair or bedside chair)?: Total Help needed to walk in hospital room?: Total Help needed climbing 3-5 steps with a railing? : Total 6 Click Score: 6    End of Session Equipment Utilized During Treatment: Other (comment) (vent) Activity Tolerance: Patient limited by fatigue Patient left: in bed;with call bell/phone within reach;with bed alarm set Nurse Communication: Mobility status PT Visit Diagnosis: Difficulty in walking, not elsewhere classified (R26.2);Unsteadiness on feet (R26.81);Other abnormalities of gait and mobility (R26.89);Muscle weakness (generalized) (M62.81)  Time: 0370-4888 PT Time Calculation (min) (ACUTE ONLY): 28 min   Charges:   PT Evaluation $PT Eval High Complexity: 1 High          Mykael Batz P, PT Acute Rehabilitation Services Pager: [phone] Office: [phone]   Sandy Salaam Shanta Dorvil 11/03/2020, 9:24 AM

## 2020-11-03 NOTE — Evaluation (Signed)
Occupational Therapy Evaluation Patient Details Name: Devin [name] MRN: [id number] DOB: 1941/05/16 Today's Date: 11/03/2020    History of Present Illness Pt is a 80 yo male admitted from [place] 5/24 to ICU after PEG placement and hypotension with need for repeat exploration and evacuation. Pt remained on vent post op. Pt with complex PMHx recently admitted 09/10/20-10/06/20 after fall with T11 chance fx and L1 compression fx s/p PLIF T11-12 on 4/8. Pt with complex respiratory failure requiring trach 4/12. Pt then transferred to CIR 10/06/20; readmitted to [facility] acute care on 10/07/20 for low blood sugars. Other PMH includes HTN, gout, afib, ankylosing spondylitis, osteoporosis, prostate CA, DM2.   Clinical Impression   PTA patient was living with his spouse for whom he was the primary caregiver for and was grossly I with ADLs/IADLs without AD. Since fall and fx patient has required assist for all ADLs/ADL transfers and functional mobility with use of RW. Prior to return to acute care, patient was completing UB bathing/dressing with Min A and toilet transfers with Mod A. Patient currently functioning below baseline demonstrating observed ADLs including LB dressing to don footwear with Total A. Patient also requiring +2 assist for bed mobility and for lateral scoots toward HOB. Patient also limited by deficits listed below including generalized weakness, decreased cardiopulmonary endurance, and need for ventilator support and would benefit from continued acute OT services in prep for safe d/c to next level of care.     Follow Up Recommendations  CIR    Equipment Recommendations  Other (comment) (TBD)    Recommendations for Other Services Rehab consult     Precautions / Restrictions Precautions Precautions: Fall;Back Precaution Booklet Issued: No Precaution Comments: vent, trach, PEG, abdominal incision, TLSO Required Braces or Orthoses: Spinal Brace Spinal Brace: Thoracolumbosacral  orthotic;Applied in sitting position Restrictions Weight Bearing Restrictions: No      Mobility Bed Mobility Overal bed mobility: Needs Assistance Bed Mobility: Rolling;Sidelying to Sit;Sit to Sidelying Rolling: Mod assist Sidelying to sit: Mod assist;+2 for physical assistance     Sit to sidelying: Mod assist;+2 for physical assistance General bed mobility comments: physical assist with cues to roll to right and rise from side with assist to clear legs off surface. Return to supine with assist to control trunk and bring legs to surface. Pt EOB grossly 8 min but denied donning brace in sitting. lateral scoot at EOB x 3 with max +2 assist with cues for sequence and physical assist    Transfers                 General transfer comment: Patient declined all sit to stand transfers. No reason specified.    Balance Overall balance assessment: Needs assistance Sitting-balance support: Feet supported;No upper extremity supported Sitting balance-Leahy Scale: Fair Sitting balance - Comments: EOB 8 min without physical assist                                   ADL either performed or assessed with clinical judgement   ADL Overall ADL's : Needs assistance/impaired Eating/Feeding: NPO                   Lower Body Dressing: Total assistance;Bed level Lower Body Dressing Details (indicate cue type and reason): Total A to don footwear in supine.                     Vision Baseline Vision/History:  No visual deficits Patient Visual Report: No change from baseline Vision Assessment?: No apparent visual deficits     Perception     Praxis      Pertinent Vitals/Pain Pain Assessment: Faces Faces Pain Scale: Hurts little more Pain Location: generalized grimace does not endorse back or abdominal pain but discomfort Pain Descriptors / Indicators: Grimacing Pain Intervention(s): Limited activity within patient's tolerance;Monitored during  session;Repositioned     Hand Dominance Right   Extremity/Trunk Assessment Upper Extremity Assessment Upper Extremity Assessment: Generalized weakness   Lower Extremity Assessment Lower Extremity Assessment: Defer to PT evaluation RLE Deficits / Details: grossly 2/5 with pt not fully participating in movement with assist for all movement of bil LE LLE Deficits / Details: grossly 2/5 with pt not fully participating in movement with assist for all movement of bil LE   Cervical / Trunk Assessment Cervical / Trunk Assessment: Other exceptions Cervical / Trunk Exceptions: s/p back sx and abdominal wound   Communication Communication Communication: Tracheostomy   Cognition Arousal/Alertness: Awake/alert Behavior During Therapy: Flat affect Overall Cognitive Status: Difficult to assess Area of Impairment: Following commands                       Following Commands: Follows one step commands inconsistently;Follows one step commands with increased time       General Comments: pt with flat affect able to state name, not oriented to time. Pt with grimace and difficulty stating what is bothering him but fatigued with EOB and unable to tolerate donning brace or further mobility   General Comments       Exercises Exercises: General Upper Extremity General Exercises - Upper Extremity Shoulder Flexion: AROM;Both;10 reps General Exercises - Lower Extremity Heel Slides: AAROM;Both;10 reps;Supine   Shoulder Instructions      Home Living Family/patient expects to be discharged to:: Inpatient rehab Living Arrangements: Spouse/significant other Available Help at Discharge: Family;Available 24 hours/day Type of Home: House Home Access: Stairs to enter [employer] of Steps: 3   Home Layout: One level     Bathroom Shower/Tub: Occupational psychologist: Handicapped height     Home Equipment: Environmental consultant - 2 wheels;Bedside commode          Prior  Functioning/Environment Level of Independence: Needs assistance  Gait / Transfers Assistance Needed: was independent and caregiver for spouse prior to fall and fx. Since that time has required assist and working toward gait on CIR up to 80' min assist ADL's / Homemaking Assistance Needed: assist for all ADLs since fall            OT Problem List: Decreased strength;Decreased activity tolerance;Impaired balance (sitting and/or standing);Decreased safety awareness;Decreased knowledge of use of DME or AE;Decreased knowledge of precautions;Obesity;Impaired UE functional use;Pain;Cardiopulmonary status limiting activity      OT Treatment/Interventions: Self-care/ADL training;Therapeutic exercise;Neuromuscular education;Energy conservation;DME and/or AE instruction;Manual therapy;Cognitive remediation/compensation;Therapeutic activities;Patient/family education;Balance training    OT Goals(Current goals can be found in the care plan section) Acute Rehab OT Goals Patient Stated Goal: pt agreeable to work with therapy but did not state goal OT Goal Formulation: With patient/family Time For Goal Achievement: 11/17/20 Potential to Achieve Goals: Good ADL Goals Pt Will Transfer to Toilet: with mod assist;bedside commode Pt Will Perform Toileting - Clothing Manipulation and hygiene: with mod assist;sit to/from stand Pt/caregiver will Perform Home Exercise Program: Both right and left upper extremity;With written HEP provided Additional ADL Goal #1: Patient will tolerate EOB activity for 15 minutes in  prep for ADLs. Additional ADL Goal #2: Patient will complete 3/3 grooming tasks seated EOB with set-up assist.  OT Frequency: Min 2X/week   Barriers to D/C:            Co-evaluation PT/OT/SLP Co-Evaluation/Treatment: Yes Reason for Co-Treatment: Complexity of the patient's impairments (multi-system involvement);For patient/therapist safety PT goals addressed during session: Mobility/safety with  mobility        AM-PAC OT "6 Clicks" Daily Activity     Outcome Measure Help from another person eating meals?: Total Help from another person taking care of personal grooming?: A Lot Help from another person toileting, which includes using toliet, bedpan, or urinal?: Total Help from another person bathing (including washing, rinsing, drying)?: Total Help from another person to put on and taking off regular upper body clothing?: Total Help from another person to put on and taking off regular lower body clothing?: Total 6 Click Score: 7   End of Session Equipment Utilized During Treatment: Oxygen (via vent) Nurse Communication: Mobility status;Precautions  Activity Tolerance: Patient limited by fatigue Patient left: with call bell/phone within reach;in chair;with chair alarm set;with family/visitor present  OT Visit Diagnosis: Unsteadiness on feet (R26.81);Muscle weakness (generalized) (M62.81)                Time: 8921-1941 OT Time Calculation (min): 31 min Charges:  OT General Charges $OT Visit: 1 Visit OT Evaluation $OT Eval High Complexity: 1 High  Hebert Dooling H. OTR/L Supplemental OT, Department of rehab services [phone]  Deryl Ports R H. 11/03/2020, 12:34 PM

## 2020-11-03 NOTE — Progress Notes (Signed)
[facility] ADULT ICU REPLACEMENT PROTOCOL   The patient does apply for the [facility] Adult ICU Electrolyte Replacment Protocol based on the criteria listed below:   1. Is GFR >/= 30 ml/min? Yes.    Patient's GFR today is 60 2. Is SCr </= 2? Yes.   Patient's SCr is 1.24 ml/kg/hr 3. Did SCr increase >/= 0.5 in 24 hours? No. 4. Abnormal electrolyte(s):  K 3.6, Mg 1.8 5. Ordered repletion with: protocol 6. If a panic level lab has been reported, has the CCM MD in charge been notified? Yes.  .   Physician:  Lowella Bandy R Marlayna Bannister 11/03/2020 5:38 AM

## 2020-11-04 ENCOUNTER — Encounter (HOSPITAL_COMMUNITY): Payer: Self-pay | Admitting: Pulmonary Disease

## 2020-11-04 DIAGNOSIS — Z515 Encounter for palliative care: Secondary | ICD-10-CM

## 2020-11-04 DIAGNOSIS — T17908A Unspecified foreign body in respiratory tract, part unspecified causing other injury, initial encounter: Secondary | ICD-10-CM

## 2020-11-04 LAB — GLUCOSE, CAPILLARY
Glucose-Capillary: 105 mg/dL — ABNORMAL HIGH (ref 70–99)
Glucose-Capillary: 105 mg/dL — ABNORMAL HIGH (ref 70–99)
Glucose-Capillary: 122 mg/dL — ABNORMAL HIGH (ref 70–99)
Glucose-Capillary: 131 mg/dL — ABNORMAL HIGH (ref 70–99)
Glucose-Capillary: 136 mg/dL — ABNORMAL HIGH (ref 70–99)
Glucose-Capillary: 147 mg/dL — ABNORMAL HIGH (ref 70–99)

## 2020-11-04 LAB — BASIC METABOLIC PANEL
Anion gap: 8 (ref 5–15)
BUN: 30 mg/dL — ABNORMAL HIGH (ref 8–23)
CO2: 22 mmol/L (ref 22–32)
Calcium: 8 mg/dL — ABNORMAL LOW (ref 8.9–10.3)
Chloride: 100 mmol/L (ref 98–111)
Creatinine, Ser: 1.21 mg/dL (ref 0.61–1.24)
GFR, Estimated: >60 mL/min (ref >60)
Glucose, Bld: 86 mg/dL (ref 70–99)
Potassium: 4 mmol/L (ref 3.5–5.1)
Sodium: 130 mmol/L — ABNORMAL LOW (ref 135–145)

## 2020-11-04 LAB — CBC
HCT: 23.1 % — ABNORMAL LOW (ref 39.0–52.0)
Hemoglobin: 7.5 g/dL — ABNORMAL LOW (ref 13.0–17.0)
MCH: 30.9 pg (ref 26.0–34.0)
MCHC: 32.5 g/dL (ref 30.0–36.0)
MCV: 95.1 fL (ref 80.0–100.0)
Platelets: 166 10*3/uL (ref 150–400)
RBC: 2.43 MIL/uL — ABNORMAL LOW (ref 4.22–5.81)
RDW: 15.9 % — ABNORMAL HIGH (ref 11.5–15.5)
WBC: 12.1 10*3/uL — ABNORMAL HIGH (ref 4.0–10.5)
nRBC: 0 % (ref 0.0–0.2)

## 2020-11-04 LAB — MAGNESIUM: Magnesium: 2.1 mg/dL (ref 1.7–2.4)

## 2020-11-04 MED ORDER — PAROXETINE HCL 10 MG PO TABS
10.0000 mg | ORAL_TABLET | Freq: Every day | ORAL | Status: DC
  Administered 2020-11-04 – 2020-11-20 (×17): 10 mg
  Filled 2020-11-04 (×17): qty 1

## 2020-11-04 MED ORDER — TRAZODONE HCL 50 MG PO TABS: 50.0000 mg | ORAL_TABLET | Freq: Every day | ORAL | Status: DC

## 2020-11-04 MED ORDER — TRAZODONE HCL 50 MG PO TABS
50.0000 mg | ORAL_TABLET | Freq: Every day | ORAL | Status: DC
  Administered 2020-11-04 – 2020-11-18 (×14): 50 mg
  Filled 2020-11-04 (×14): qty 1

## 2020-11-04 NOTE — Progress Notes (Signed)
NAME:  Devin [name], MRN:  [id number], DOB:  1940-10-31, LOS: 4 ADMISSION DATE:  10/31/2020, CONSULTATION DATE:  5/3 REFERRING MD:  Verlon Au, CHIEF COMPLAINT:  Dyspnea   History of Present Illness:  80 y/o had a fall and spinal fracture with post admission course complicated by prolonged mechanical ventilation and 2 extubation failures, ultimately requiring a tracheostomy.  There was concern for supra and sub-glottic airway narrowing.  XL tracheostomy ultimately placed on 5/3.  Moved to Rehab and has been working with them there.  On 5/24 [name] underwent PEG tube placement in the OR with Dr. Donne Hazel without incident. PCCM was called to PACU for admission for post-op hypotension. ABG 7.25/68/144/30. HGB on iStat 6.2. HGB post op 7.1. On 195 phenylephrine. PCCM to admit.  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 Was able to bypass with distal XLT. 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 Voice quality better. Feeling stronger   5/16 foley placed by urology. working with OT, talking   5/19 capping trials with tracheostomy  5/24 PEG placement in OR. PCCM reconsulted on for hypotension. 6.0 XLTD cuffless swithced to 6.0 XLTD cuffed. CVC.  Went to OR for ex lap, washout, ligation of omental artery bleed  5/25 stabilized, start PS  trials   Interim History / Subjective:  Reports difficulty sleeping. Feeling anxious. Did not tolerate trach collar yesterday. Refusing turns  Objective   Blood pressure (!) 129/58, pulse 74, temperature 99 F (37.2 C), temperature source Oral, resp. rate (!) 30, weight 88.5 kg, SpO2 100 %.    Vent Mode: PRVC FiO2 (%):  [30 %-60 %] 60 % Set Rate:  [30 bmp] 30 bmp Vt Set:  [600 mL] 600 mL PEEP:  [5 cmH20] 5 cmH20 Plateau Pressure:  [25 cmH20-31 cmH20] 25 cmH20   Intake/Output Summary (Last 24 hours) at 11/04/2020 0816 Last data filed at 11/04/2020 0636 Gross per 24 hour  Intake 2760 ml  Output 970 ml  Net 1790 ml   Filed Weights   11/02/20 0500 11/03/20 0500 11/04/20 0409  Weight: 93 kg 91.9 kg 88.5 kg   Physical Exam: General: Elderly, chronic-appearing, no acute distress HENT: [place], AT, OP clear, MMM Neck: Trach in place, c/d/i Eyes: EOMI, no scleral icterus Respiratory: Clear to auscultation bilaterally.  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: repeat ENT scope 5/25 showing no obstruction but "had copious pooled secretions in bilateral pyriform recesses as well as silent aspiration." Placed back on vent in setting of ABLA and hemorrhagic shock  5/24 - PS and trach collar trial as tolerated - Full vent support nightly - VAP  Hemorrhagic shock- due to post-PEG omental bleed s/p washout, ongoing pain issues Stable hemoglobin - Trend CBC - Transfuse for Hg goal >7 - Hold heparin per surgery due  to oozing - Serial monitoring of peri-incisional induration  DM2- variable sugars - SSI  Hx Afib on AC PTA - Continue to hold [facility] for until hgb stabilizes   Anxiety - Klonopin 0.5 mg BID - Start Paxil 10 mg daily  Hypothyroidism - Synthroid 125 mcg daily  Deconditioning- PT/OT  Insomnia - Trazodone nightly   Best practice (right click and "Reselect all SmartList Selections" daily)  Diet:  TF Pain/Anxiety/Delirium protocol (if indicated): N/A VAP protocol (if indicated): Yes DVT prophylaxis: SCDs GI prophylaxis: PPI Glucose control:  SSI yes Central venous access:  Needs Arterial line:  Removed Foley:  Yes, and needs: urology placed Mobility:  bedrest PT consulted: yes Last date of multidisciplinary goals of care discussion 5/24, see Dr. Anastasia Pall note Code Status:  DNR Disposition: ICU  The patient is critically ill with multiple organ systems failure and requires high complexity decision making for assessment and support, frequent evaluation and titration of therapies, application of advanced monitoring technologies and extensive interpretation of multiple databases.  Independent Critical Care Time: 36 Minutes.   Rodman Pickle, M.D. [facility] Pulmonary/Critical Care Medicine 11/04/2020 8:16 AM   Please see Amion for pager number to reach on-call Pulmonary and Critical Care Team.

## 2020-11-04 NOTE — Progress Notes (Signed)
4 Days Post-Op   Subjective/Chief Complaint: Tired, just put back on vent.  Tolerating TFs at 75cc/hr   Objective: Vital signs in last 24 hours: Temp:  [98.3 F (36.8 C)-99.4 F (37.4 C)] 99 F (37.2 C) (05/28 0700) Pulse Rate:  [71-106] 74 (05/28 0630) Resp:  [0-31] 30 (05/28 0630) BP: (97-134)/(54-91) 129/58 (05/28 0600) SpO2:  [100 %] 100 % (05/28 0814) FiO2 (%):  [30 %-60 %] 60 % (05/28 0814) Weight:  [88.5 kg] 88.5 kg (05/28 0409) Last BM Date: 11/04/20  Intake/Output from previous day: 05/27 0701 - 05/28 0700 In: 2835 [NG/GT:1575] Out: 970 [Urine:970] Intake/Output this shift: No intake/output data recorded.  GI: soft, approp tender, nondistended, incision clean with no further bleeding noted, g tube in place  Lab Results:  Recent Labs    11/03/20 0344 11/04/20 0120  WBC 13.4* 12.1*  HGB 7.2* 7.5*  HCT 21.3* 23.1*  PLT 130* 166   BMET Recent Labs    11/03/20 0344 11/04/20 0120  NA 130* 130*  K 3.6 4.0  CL 102 100  CO2 22 22  GLUCOSE 133* 86  BUN 32* 30*  CREATININE 1.24 1.21  CALCIUM 7.8* 8.0*   PT/INR No results for input(s): LABPROT, INR in the last 72 hours. ABG No results for input(s): PHART, HCO3 in the last 72 hours.  Invalid input(s): PCO2, PO2  Studies/Results: No results found.  Anti-infectives: Anti-infectives (From admission, onward)   None      Assessment/Plan: POD 4 elap/loa/gastrostomy placement followed by reoperation for hemorrhagic shock- [name] -appreciate CCM care -g-tube being used and tolerating well -abdominal exam is stable -hgb stable at 7.5 this am.  No further oozing.  If remains stable can start anticoagulation tomorrow. -will check again early this week.  Call if needed in the meantime.  RAESEAN BARTOLETTI 11/04/2020

## 2020-11-04 NOTE — Consult Note (Signed)
Consultation Note Date: 11/04/2020   Patient Name: Devin [name]  DOB: [birth year]  MRN: [id number]  Age / Sex: 80 y.o., male  PCP: Prince Solian, MD Referring Physician: Margaretha Seeds, MD  Reason for Consultation: Establishing goals of care and Psychosocial/spiritual support  HPI/Patient Profile: 80 y.o. male "Devin [name]"  with past medical history of diabetes, thyroid cancer, prostate cancer with recurrence, osteoporosis, Crohn's disease, stage I lung cancer status post left upper lobe lobectomy, atrial fibrillation, ankylosing spondylitis and previous spinal fracture, recurrent aspiration pneumonia who was originally admitted on 09/10/2020 with excruciating pain and SOB after a fall with a spinal fracture of T11, and a large pleural effusion.  He underwent neurosurgery repair on 4/8.  He had a prolonged intubation with two failed extubations and received tracheostomy and was gradually transitioned to trach collar.  He was discharged to CIR on 10/06/2020.  He was readmitted into [facility] on 10/07/2020 with aspiration pneumonia with bilateral effusions, confusion and hypoglycemia (due to dislodged cor trak and continued insulin).  The cor trak was replaced and a trach exchange revealed subglottic stenosis with tracheomalacia.  The increased tissue on the wall of the trachea was biopsied and found to be inflammatory.   His hypoglycemia and encephalopathy resolved and he was discharged/readmitted back to CIR on 10/12/2020.  Over time in CIR patient was able to walk 120' with RW and some brakes.  He was having difficulty with dysphagia and SLP recommended he be kept NPO.  He was subsequently admitted back to [facility] for open gastrostomy placement on 10/31/2020.   Unfortunately after placement he suffered an omental artery bleed with hemorrhagic shock and was admitted to [facility].  He returned to the OR for an exploratory laparoscopy, and  ligation of the omental artery bleed. He stabilized on 11/01/2020.  ENT evaluation showed copious pooled secretions  In the bilateral pyriform recesses and well as silent aspiration.    Today (11/04/2020) he is tolerating TF at 75 cc/hour.  He is undergoing weaning trials and tolerating PS for several hours at a time.  His mind is clear but he is unable to speak until PMV is re-introduced.  Daughter/family is usually at bedside.  I discussed his case with Dr. Loanne Drilling prior to seeing him.  She expressed concern that he has been in the hospital for a long time.  He is not dying, but he is not improving and appears to be losing his energy and spirit.  He is less communicative and seems understandably exhausted.  She started him on an antidepressant 11/04/2020.  Clinical Assessment and Goals of Care: I have reviewed medical records including EPIC notes, labs and imaging, received report from RN, assessed the patient and then met at the bedside along with his daughter [name] to discuss diagnosis prognosis, GOC, EOL wishes, disposition and options.  I introduced Palliative Medicine as specialized medical care for people living with serious illness. It focuses on providing relief from the symptoms and stress of a serious illness. The goal is to improve quality of  life for both the patient and the family.  We discussed a brief life review of the patient and then focused on their current illness.  [name] tells me that Devin [name] has lived in the [place] area his entire life.  His family owns multiple properties near [facility].  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  He and his wife [name] have 3 daughters: [name] and [name].  Devin [name] loves his friends, loves reading mysteries such as [name] and is a very active man.  He belongs to [employer] is active in the [employer] study there and serves as an elder.  It seems some of his closest friends are his church brothers.  His pastor comes to visit him every other day in the hospital.  Devin [name] has never consumed alcohol and has not had any tobacco in over 20 years.  He is a strong man who has survived for cancers but fortunately never had to have chemotherapy.  [name] explains some of the difficulties they have had in the hospital.  She gives me the details of the 2 days she thought her father was going to die.  The first being when he pulled out his core track in CIR, was given insulin, and his glucose dropped to 39.  He became encephalopathic and was transferred to the ICU.  The second time was recently when he developed his mental artery bleed after gastrostomy tube placement.  It has been a very difficult hospitalization for the entire family.  She explains that Devin [name] seems to have lost his energy.   He was suffering with insomnia over the last few days in CIR.   [name] feels that contributed to his encephalopathy and subsequently his depressed mood.   She is relieved that klonopin was started bid a couple of days ago as her father has finally been able to rest.  I attempted to elicit values and goals of care important to the patient.  [name] explains that [place] Directive indicates that he does not want life support if he is facing a irrecoverable situation.  She is also certain that neither Bill nor [name] ever want to be an invalid, and they never want to live in a nursing home.  Debbie feels that to her father quality of life would mean being able to get up and move about the house - even if he required a wheel chair to go to church.   Her father does not want to be dependent for care and unable to help himself.  I asked [name] about her mother.  She has had a heart condition, and anxiety in the past.  Her mother is worried Devin [name] is going to be mad at the family for "putting him thru  this".  She is at time scared that we are doing all of this for nothing.    I asked [name] where she gathers her strength and conviction.  She shared some incredibly difficult experience that she has been thru and tells me her strength comes from God.  I trust it does.  Debbie and I chatted about her father's condition.  He is incredibly fragile at this point.  She understands this - but she does not see anything that can not be remedied.  She does not see anything that he can not come back from if  we are able to give him the will to keep working thru this.  I committed to continue to support her father and the family.  Questions and concerns were addressed.   The family was encouraged to call with questions or concerns.  PMT will continue to support holistically.     Primary Decision Maker:  PATIENT      SUMMARY OF RECOMMENDATIONS    Will ask Chaplain for supportive visits and to read the bible to the patient. Fully agree with CCM's addition of klonopin and Paxil. Patient has a long history of battling Crohn's disease.   His normal baseline is diarrhea.  Please do not give lomotil/immodium to stop diarrhea On the floor understand that the patient can ring the call bell but can not speak.   He needs someone to go to the room when the call bell is pressed. PMT will continue to follow with you offering support and assisting in [place] as needed.  Code Status/Advance Care Planning:  DNR   Symptom Management:   Per primary   Additional Recommendations (Limitations, Scope, Preferences):  Full Scope Treatment  Palliative Prophylaxis:   Aspiration, Delirium Protocol, Frequent Pain Assessment, Oral Care and Turn Reposition  Psycho-social/Spiritual:   Desire for further Chaplaincy support:  Requested.  Prognosis:  Unable to determine.  Unfortunately patient foundation has become very fragile.  He is at high risk for acute decompensation.    Discharge Planning: To Be Determined   Family is planning on CIR.      Primary Diagnoses: Present on Admission: . Shock circulatory ([place]) . Hypotension . Acute respiratory failure with hypercapnia ([place])   I have reviewed the medical record, interviewed the patient and family, and examined the patient. The following aspects are pertinent.  Past Medical History:  Diagnosis Date  . A-fib ([place])   . Adenocarcinoma of left lung, stage 1 ([place]) 03/10/2015  . Ankylosing spondylitis ([place])    Diagnosed during lumbar fracture summer of 2016    . Crohn's disease ([place])   . Gout   . HIstory of basal cell cancer of face    THYROID CA HX  . History of kidney stones   . Hypertension   . Hypothyroidism   . Impotence   . 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 Obesity (BMI 30-39.9)   . Osteoporosis    Pt completed 5 years of fosamax in 2013     . Prostate cancer with recurrence    Treated with prostatectomy with recurrence 2012 with Lupron treatment now him   . Psoriasis   . Thyroid cancer ([place]) 1999   Treated with RAI and total thyroidectomy   . Type II diabetes mellitus ([place])    Social History   Socioeconomic History  . Marital status: Married    Spouse name: Not on file  . Number of children: Not on file  . Years of education: Not on file  . Highest education level: Not on file  Occupational History  . Occupation: Retired  Tobacco Use  . Smoking status: Former Smoker    Packs/day: 2.00    Years: 30.00    Pack years: 60.00    Types: Cigarettes    Quit date: 12/26/1989    Years since quitting: 30.8  . Smokeless tobacco: Never Used  Vaping Use  . Vaping Use: Never used  Substance and Sexual Activity  . Alcohol use: Not Currently    Alcohol/week: 0.0 standard drinks  . Drug use: No  .  Sexual activity: Never  Other Topics Concern  . Not on file  Social History Narrative   Retired Development worker, international aid   Social Determinants of Radio broadcast assistant Strain: Not on [employer] Insecurity:  Not on file  Transportation Needs: Not on file  Physical Activity: Not on file  Stress: Not on file  Social Connections: Not on file   Family History  Problem Relation Age of Onset  . CAD Mother   . Cancer Neg Hx        No cancer in the patient's immediate family, except of course for the patient himself, as noted    No Known Allergies    Vital Signs: BP (!) 129/58   Pulse 74   Temp 99 F (37.2 C) (Oral)   Resp (!) 30   Wt 88.5 kg   SpO2 99%   BMI 27.21 kg/m  Pain Scale: CPOT   Pain Score: Asleep   SpO2: SpO2: 99 % O2 Device:SpO2: 99 % O2 Flow Rate: .O2 Flow Rate (L/min): 10 L/min    Palliative Assessment/Data:  20%     Time In: 10:30 Time Out: 11:30  Time Total: 60 min. Visit consisted of counseling and education dealing with the complex and emotionally intense issues surrounding the need for palliative care and symptom management in the setting of serious and potentially life-threatening illness. Greater than 50%  of this time was spent counseling and coordinating care related to the above assessment and plan.  Signed by: Florentina Jenny, PA-C Palliative Medicine  Please contact Palliative Medicine Team phone at [phone] for questions and concerns.  For individual provider: See Shea Evans

## 2020-11-04 NOTE — Progress Notes (Signed)
Pt on TC 60%. Resting well.

## 2020-11-05 LAB — HEPARIN LEVEL (UNFRACTIONATED): Heparin Unfractionated: 0.18 IU/mL — ABNORMAL LOW (ref 0.30–0.70)

## 2020-11-05 LAB — BASIC METABOLIC PANEL
Anion gap: 6 (ref 5–15)
BUN: 28 mg/dL — ABNORMAL HIGH (ref 8–23)
CO2: 22 mmol/L (ref 22–32)
Calcium: 8.2 mg/dL — ABNORMAL LOW (ref 8.9–10.3)
Chloride: 103 mmol/L (ref 98–111)
Creatinine, Ser: 1 mg/dL (ref 0.61–1.24)
GFR, Estimated: >60 mL/min (ref >60)
Glucose, Bld: 125 mg/dL — ABNORMAL HIGH (ref 70–99)
Potassium: 4.4 mmol/L (ref 3.5–5.1)
Sodium: 131 mmol/L — ABNORMAL LOW (ref 135–145)

## 2020-11-05 LAB — MAGNESIUM: Magnesium: 2 mg/dL (ref 1.7–2.4)

## 2020-11-05 LAB — CBC
HCT: 24.9 % — ABNORMAL LOW (ref 39.0–52.0)
Hemoglobin: 8.1 g/dL — ABNORMAL LOW (ref 13.0–17.0)
MCH: 30.8 pg (ref 26.0–34.0)
MCHC: 32.5 g/dL (ref 30.0–36.0)
MCV: 94.7 fL (ref 80.0–100.0)
Platelets: 204 10*3/uL (ref 150–400)
RBC: 2.63 MIL/uL — ABNORMAL LOW (ref 4.22–5.81)
RDW: 16 % — ABNORMAL HIGH (ref 11.5–15.5)
WBC: 14.7 10*3/uL — ABNORMAL HIGH (ref 4.0–10.5)
nRBC: 0 % (ref 0.0–0.2)

## 2020-11-05 LAB — GLUCOSE, CAPILLARY
Glucose-Capillary: 104 mg/dL — ABNORMAL HIGH (ref 70–99)
Glucose-Capillary: 123 mg/dL — ABNORMAL HIGH (ref 70–99)
Glucose-Capillary: 126 mg/dL — ABNORMAL HIGH (ref 70–99)
Glucose-Capillary: 161 mg/dL — ABNORMAL HIGH (ref 70–99)
Glucose-Capillary: 176 mg/dL — ABNORMAL HIGH (ref 70–99)
Glucose-Capillary: 202 mg/dL — ABNORMAL HIGH (ref 70–99)

## 2020-11-05 MED ORDER — LORAZEPAM 0.5 MG PO TABS: 0.2500 mg | ORAL_TABLET | Freq: Four times a day (QID) | ORAL | Status: DC | PRN

## 2020-11-05 MED ORDER — CLONAZEPAM 0.25 MG PO TBDP
0.2500 mg | ORAL_TABLET | Freq: Every day | ORAL | Status: DC | PRN
  Administered 2020-11-05: 0.25 mg via ORAL
  Filled 2020-11-05: qty 1

## 2020-11-05 MED ORDER — CLONAZEPAM 0.5 MG PO TABS
0.5000 mg | ORAL_TABLET | Freq: Every day | ORAL | Status: DC
  Administered 2020-11-06 – 2020-11-07 (×2): 0.5 mg
  Filled 2020-11-05 (×2): qty 1

## 2020-11-05 MED ORDER — CLONAZEPAM 0.25 MG PO TBDP: 0.2500 mg | ORAL_TABLET | Freq: Every day | ORAL | Status: DC | PRN

## 2020-11-05 MED ORDER — HEPARIN (PORCINE) 25000 UT/250ML-% IV SOLN
1600.0000 [IU]/h | INTRAVENOUS | Status: DC
  Administered 2020-11-05: 1150 [IU]/h via INTRAVENOUS
  Administered 2020-11-06: 1350 [IU]/h via INTRAVENOUS
  Administered 2020-11-07: 1600 [IU]/h via INTRAVENOUS
  Administered 2020-11-07: 1450 [IU]/h via INTRAVENOUS
  Filled 2020-11-05 (×4): qty 250

## 2020-11-05 MED ORDER — CLONAZEPAM 0.5 MG PO TABS
0.5000 mg | ORAL_TABLET | Freq: Every day | ORAL | Status: DC
  Filled 2020-11-05: qty 1

## 2020-11-05 MED ORDER — CLONAZEPAM 0.5 MG PO TABS: 0.2500 mg | ORAL_TABLET | Freq: Every day | ORAL | Status: DC | PRN

## 2020-11-05 NOTE — Progress Notes (Signed)
[place] for heparin Indication: atrial fibrillation  No Known Allergies  Patient Measurements: Weight: 90.7 kg (199 lb 15.3 oz) Heparin Dosing Weight: 85 kg  Vital Signs: Temp: 99.1 F (37.3 C) (05/29 1100) Temp Source: Oral (05/29 1100) BP: 140/60 (05/29 1100) Pulse Rate: 98 (05/29 1100)  Labs: Recent Labs    11/03/20 0344 11/04/20 0120 11/05/20 0024  HGB 7.2* 7.5* 8.1*  HCT 21.3* 23.1* 24.9*  PLT 130* 166 204  CREATININE 1.24 1.21 1.00    Estimated Creatinine Clearance: 71.3 mL/min (by C-G formula based on SCr of 1 mg/dL).   Assessment: 80 year old male with history Afib on apixaban (last dose 5/19 am). Pharmacy is consulted to restart heparin drip after gastrostomy tube was placed last week.   Patient was therapeutic previously on 1150 units/hr.    Goal of Therapy:  Heparin level 0.3-0.7 units/ml Monitor platelets by anticoagulation protocol: Yes   Plan:  Start heparin at 1150 units/hr Check HL in 8 hours Daily heparin level, aPTT, CBC  Alanda Slim, PharmD, [facility] Clinical Pharmacist Please see AMION for all Pharmacists' Contact Phone Numbers 11/05/2020, 1:54 PM

## 2020-11-05 NOTE — Progress Notes (Signed)
NAME:  Devin [name], MRN:  [id number], DOB:  [birth year], LOS: 5 ADMISSION DATE:  10/31/2020, CONSULTATION DATE:  5/3 REFERRING MD:  Verlon Au, CHIEF COMPLAINT:  Dyspnea   History of Present Illness:  80 y/o had a fall and spinal fracture with post admission course complicated by prolonged mechanical ventilation and 2 extubation failures, ultimately requiring a tracheostomy.  There was concern for supra and sub-glottic airway narrowing.  XL tracheostomy ultimately placed on 5/3.  Moved to Rehab and has been working with them there.  On 5/24 [name] underwent PEG tube placement in the OR with Dr. Donne Hazel without incident. PCCM was called to PACU for admission for post-op hypotension. ABG 7.25/68/144/30. HGB on iStat 6.2. HGB post op 7.1. On 195 phenylephrine. PCCM to admit.  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 Was able to bypass with distal XLT. 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 Voice quality better. Feeling stronger   5/16 foley placed by urology. working with OT, talking   5/19 capping trials with tracheostomy  5/24 PEG placement in OR. PCCM reconsulted on for hypotension. 6.0 XLTD cuffless swithced to 6.0 XLTD cuffed. CVC.  Went to OR for ex lap, washout, ligation of omental artery bleed  5/25 stabilized, start PS  trials  5/26>5/30 Not tolerating trach collar trials for >1 hour  Interim History / Subjective:  Tolerating PS/trach collar 45 minutes yesterday  Objective   Blood pressure 140/60, pulse 98, temperature 99.1 F (37.3 C), temperature source Oral, resp. rate (!) 29, weight 90.7 kg, SpO2 100 %.    Vent Mode: PSV;CPAP FiO2 (%):  [30 %] 30 % Set Rate:  [30 bmp] 30 bmp Vt Set:  [600 mL] 600 mL PEEP:  [5 cmH20] 5 cmH20 Pressure Support:  [8 cmH20-10 cmH20] 10 cmH20 Plateau Pressure:  [13 cmH20-23 cmH20] 13 cmH20   Intake/Output Summary (Last 24 hours) at 11/05/2020 1332 Last data filed at 11/05/2020 0800 Gross per 24 hour  Intake 1500 ml  Output 2325 ml  Net -825 ml   Filed Weights   11/03/20 0500 11/04/20 0409 11/05/20 0500  Weight: 91.9 kg 88.5 kg 90.7 kg   Physical Exam: General: Elderly, chronically ill-appearing, no acute distress HENT: [place], AT, OP clear, MMM Eyes: EOMI, no scleral icterus Respiratory: Clear to auscultation bilaterally.  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: repeat ENT scope 5/25 showing no obstruction but "had copious pooled secretions in bilateral pyriform recesses as well as silent  aspiration." Placed back on vent in setting of ABLA and hemorrhagic shock 5/24 - PS and trach collar trial as tolerated - Full vent support - VAP - PT/OT  Hemorrhagic shock- due to post-PEG omental bleed s/p washout, ongoing pain  issues Stable hemoglobin - Trend CBC - Transfuse for Hg goal >7 - Restart systemic heparin  - Serial monitoring of peri-incisional induration  DM2- variable sugars - SSI  Hx Afib on AC PTA - Resume AC  - Monitor Hg tonight  Anxiety - Klonopin 0.25 mg AM and 0.5 mg PM  - PRN ativan q6h - Continue Paxil 10 mg daily  Hypothyroidism - Synthroid 125 mcg daily  Deconditioning- PT/OT  Insomnia - Trazodone nightly   Best practice (right click and "Reselect all SmartList Selections" daily)  Diet:  TF Pain/Anxiety/Delirium protocol (if indicated): N/A VAP protocol (if indicated): Yes DVT prophylaxis: SCDs GI prophylaxis: PPI Glucose control:  SSI yes Central venous access:  Needs Arterial line:  Removed Foley:  Yes, and needs: urology placed Mobility:  bedrest PT consulted: yes Last date of multidisciplinary goals of care discussion 5/24, see Dr. Anastasia Pall note Code Status:  DNR Disposition: ICU  The patient is critically ill with multiple organ systems failure and requires high complexity decision making for assessment and support, frequent evaluation and titration of therapies, application of advanced monitoring technologies and extensive interpretation of multiple databases.  Independent Critical Care Time: 36 Minutes.   Rodman Pickle, M.D. [facility] Pulmonary/Critical Care Medicine 11/05/2020 1:32 PM   Please see Amion for pager number to reach on-call Pulmonary and Critical Care Team.

## 2020-11-05 NOTE — Progress Notes (Signed)
Daily Progress Note   Patient Name: Devin [name]       Date: 11/05/2020 DOB: Feb 27, 1941  Age: 80 y.o. MRN#: [id number] Attending Physician: Margaretha Seeds, MD Primary Care Physician: Prince Solian, MD Admit Date: 10/31/2020  Reason for Consultation/Follow-up: psycho-social support and goals of care.  Subjective: Met patient and daughter Maudie Mercury at bedside.   Patient shaking his head no and he eyes are slowly rolled back up in his head.  He indicates he does not feel well.   I assisted the RN in cleaning up a liquid bowel movement which seemed to exhaust the patient (rolling and being raised and lowered).   Kim stated patient was more alert but received klonopin about 30 min ago and is now much less alert. Kim left to take a break while Therapist, sports and I cleaned him up.   Assessment: Patient very fragile after a prolonged MCH/CIR/MCH ordeal with multiple procedures.  Seems like the big question is whether or not his body and mind have enough resilience/reserve to improve and recover functionality.  Unable to engage patient today he is too exhausted.   Patient Profile/HPI:  80 y.o. male "[name]"  with past medical history of diabetes, thyroid cancer, prostate cancer with recurrence, osteoporosis, Crohn's disease, stage I lung cancer status post left upper lobe lobectomy, atrial fibrillation, ankylosing spondylitis and previous spinal fracture, recurrent aspiration pneumonia who was originally admitted on 09/10/2020 with excruciating pain and SOB after a fall with a spinal fracture of T11, and a large pleural effusion.  He underwent neurosurgery repair on 4/8.  He had a prolonged intubation with two failed extubations and received tracheostomy and was gradually transitioned to trach collar.  He was  discharged to CIR on 10/06/2020.  He was readmitted into [facility] on 10/07/2020 with aspiration pneumonia with bilateral effusions, confusion and hypoglycemia (due to dislodged cor trak and continued insulin).  The cor trak was replaced and a trach exchange revealed subglottic stenosis with tracheomalacia.  The increased tissue on the wall of the trachea was biopsied and found to be inflammatory.   His hypoglycemia and encephalopathy resolved and he was discharged/readmitted back to CIR on 10/12/2020.  Over time in CIR patient was able to walk 120' with RW and some brakes.  He was having difficulty with dysphagia and SLP recommended he be  kept NPO.  He was subsequently admitted back to [facility] for open gastrostomy placement on 10/31/2020.   Unfortunately after placement he suffered an omental artery bleed with hemorrhagic shock and was admitted to [facility].  He returned to the OR for an exploratory laparoscopy, and ligation of the omental artery bleed. He stabilized on 11/01/2020.  ENT evaluation showed copious pooled secretions  In the bilateral pyriform recesses and well as silent aspiration.      Length of Stay: 5   Vital Signs: BP 140/60   Pulse 98   Temp 99.4 F (37.4 C) (Oral)   Resp (!) 29   Wt 90.7 kg   SpO2 100%   BMI 27.89 kg/m  SpO2: SpO2: 100 % O2 Device: O2 Device: Ventilator O2 Flow Rate: O2 Flow Rate (L/min): 10 L/min       Palliative Assessment/Data: 10 - 20%     Palliative Care Plan    Recommendations/Plan:  Communicated with Dr. Loanne Drilling about perhaps changing day time dose of Klonopin to a lower dose shorter acting medication.  She was in agreement.  We will make that change effective tomorrow.  Chaplain services have been requested to provide support and lift his spirits as well.  PMT will continue to follow in a supportive fashion.  Code Status:  DNR  Prognosis:   Unable to determine   Discharge Planning:  Plan is for Mr. Buresh to go to CIR or home at the end of his  hospital stay.    Thank you for allowing the Palliative Medicine Team to assist in the care of this patient.  Total time spent:  25 min.     Greater than 50%  of this time was spent counseling and coordinating care related to the above assessment and plan.  Florentina Jenny, PA-C Palliative Medicine  Please contact Palliative MedicineTeam phone at [phone] for questions and concerns between 7 am - 7 pm.   Please see AMION for individual provider pager numbers.

## 2020-11-05 NOTE — Progress Notes (Signed)
   11/05/20 1405  Clinical Encounter Type  Visited With Patient and family together;Health care provider  Visit Type Initial;Critical Care;Social support   Chaplain responded to a consult, met with the patient's wife and daughter. We talked a bit about their hospitalization and how rough it has been, and about their life and [name]'s life before the fall which started these events. The family has indicated having a good supportive community around then, which they are leaning into. Chaplain offered prayer and support.Chaplain introduced spiritual care services. Spiritual care services available as needed.   Jeri Lager, Chaplain

## 2020-11-06 ENCOUNTER — Inpatient Hospital Stay (HOSPITAL_COMMUNITY): Payer: Medicare Other

## 2020-11-06 LAB — CBC
HCT: 26.6 % — ABNORMAL LOW (ref 39.0–52.0)
Hemoglobin: 8.2 g/dL — ABNORMAL LOW (ref 13.0–17.0)
MCH: 30.8 pg (ref 26.0–34.0)
MCHC: 30.8 g/dL (ref 30.0–36.0)
MCV: 100 fL (ref 80.0–100.0)
Platelets: 267 10*3/uL (ref 150–400)
RBC: 2.66 MIL/uL — ABNORMAL LOW (ref 4.22–5.81)
RDW: 16 % — ABNORMAL HIGH (ref 11.5–15.5)
WBC: 14.9 10*3/uL — ABNORMAL HIGH (ref 4.0–10.5)
nRBC: 0 % (ref 0.0–0.2)

## 2020-11-06 LAB — BASIC METABOLIC PANEL
Anion gap: 5 (ref 5–15)
BUN: 32 mg/dL — ABNORMAL HIGH (ref 8–23)
CO2: 25 mmol/L (ref 22–32)
Calcium: 8.2 mg/dL — ABNORMAL LOW (ref 8.9–10.3)
Chloride: 102 mmol/L (ref 98–111)
Creatinine, Ser: 1 mg/dL (ref 0.61–1.24)
GFR, Estimated: >60 mL/min (ref >60)
Glucose, Bld: 122 mg/dL — ABNORMAL HIGH (ref 70–99)
Potassium: 4.9 mmol/L (ref 3.5–5.1)
Sodium: 132 mmol/L — ABNORMAL LOW (ref 135–145)

## 2020-11-06 LAB — GLUCOSE, CAPILLARY
Glucose-Capillary: 115 mg/dL — ABNORMAL HIGH (ref 70–99)
Glucose-Capillary: 124 mg/dL — ABNORMAL HIGH (ref 70–99)
Glucose-Capillary: 125 mg/dL — ABNORMAL HIGH (ref 70–99)
Glucose-Capillary: 128 mg/dL — ABNORMAL HIGH (ref 70–99)
Glucose-Capillary: 129 mg/dL — ABNORMAL HIGH (ref 70–99)
Glucose-Capillary: 148 mg/dL — ABNORMAL HIGH (ref 70–99)

## 2020-11-06 LAB — HEPARIN LEVEL (UNFRACTIONATED)
Heparin Unfractionated: 0.23 IU/mL — ABNORMAL LOW (ref 0.30–0.70)
Heparin Unfractionated: 0.27 IU/mL — ABNORMAL LOW (ref 0.30–0.70)

## 2020-11-06 LAB — MAGNESIUM: Magnesium: 2 mg/dL (ref 1.7–2.4)

## 2020-11-06 MED ORDER — DEXTROSE 50 % IV SOLN: 12.5000 g | INTRAVENOUS | Status: DC

## 2020-11-06 NOTE — Progress Notes (Signed)
[place] for heparin Indication: atrial fibrillation  Labs: Recent Labs    11/03/20 0344 11/04/20 0120 11/05/20 0024 11/05/20 2308  HGB 7.2* 7.5* 8.1*  --   HCT 21.3* 23.1* 24.9*  --   PLT 130* 166 204  --   HEPARINUNFRC  --   --   --  0.18*  CREATININE 1.24 1.21 1.00  --     Assessment: 80 year old male with history Afib on apixaban (last dose 5/19 am). Pharmacy is consulted to restart heparin drip after gastrostomy tube was placed last week.   Heparin level 0.18 units/ml.  No issues noted with infusion  Goal of Therapy:  Heparin level 0.3-0.7 units/ml Monitor platelets by anticoagulation protocol: Yes   Plan:  Increase heparin to 1250 units/hr Check HL in 8 hours Daily heparin level, aPTT, CBC  Thanks for allowing pharmacy to be a part of this patient's care.  Excell Seltzer, PharmD Clinical Pharmacist  11/06/2020, 12:12 AM

## 2020-11-06 NOTE — Progress Notes (Signed)
[place] for heparin Indication: atrial fibrillation  No Known Allergies  Patient Measurements: Weight: 90.6 kg (199 lb 11.8 oz) Heparin Dosing Weight: 85 kg  Vital Signs: Temp: 99 F (37.2 C) (05/30 1944) Temp Source: Oral (05/30 1944) BP: 119/43 (05/30 1800) Pulse Rate: 82 (05/30 1800)  Labs: Recent Labs    11/04/20 0120 11/05/20 0024 11/05/20 2308 11/06/20 0700 11/06/20 1843  HGB 7.5* 8.1*  --  8.2*  --   HCT 23.1* 24.9*  --  26.6*  --   PLT 166 204  --  267  --   HEPARINUNFRC  --   --  0.18* 0.23* 0.27*  CREATININE 1.21 1.00  --  1.00  --     Estimated Creatinine Clearance: 71.2 mL/min (by C-G formula based on SCr of 1 mg/dL).   Assessment: 80 year old male with history Afib on apixaban (last dose 5/19 am). Pharmacy is consulted to restart heparin drip after PEG tube placement on 6/57 which was complicated by hemorrhagic shock.   Heparin level remains subtherapeutic at 0.27 on heparin 1,350 units/hr. No issues with infusion or access. CBC stable, no bleeding notes.  Goal of Therapy:  Heparin level 0.3-0.7 units/ml Monitor platelets by anticoagulation protocol: Yes   Plan:  Increase IV heparin to 1,450 units/hr  Check 8 hr heparin level Monitor heparin level, CBC and s/s of bleeding daily  Follow up transition to oral anticoagulant - plan for 5/31 if stable   Mercy Riding, PharmD PGY1 Acute Care Pharmacy Resident Please refer to [facility] for unit-specific pharmacist

## 2020-11-06 NOTE — Progress Notes (Signed)
ANTICOAGULATION CONSULT NOTE  Pharmacy Consult for heparin Indication: atrial fibrillation  No Known Allergies  Patient Measurements: Weight: 90.6 kg (199 lb 11.8 oz) Heparin Dosing Weight: 85 kg  Vital Signs: Temp: 97.4 F (36.3 C) (05/30 0804) Temp Source: Oral (05/30 0804) BP: 95/38 (05/30 0900) Pulse Rate: 71 (05/30 0900)  Labs: Recent Labs    11/04/20 0120 11/05/20 0024 11/05/20 2308 11/06/20 0700  HGB 7.5* 8.1*  --  8.2*  HCT 23.1* 24.9*  --  26.6*  PLT 166 204  --  267  HEPARINUNFRC  --   --  0.18* 0.23*  CREATININE 1.21 1.00  --  1.00    Estimated Creatinine Clearance: 71.2 mL/min (by C-G formula based on SCr of 1 mg/dL).   Assessment: 80 year old male with history Afib on apixaban (last dose 5/19 am). Pharmacy is consulted to restart heparin drip after PEG tube placement on 8/47 which was complicated by hemorrhagic shock.   Heparin level 0.23 on heparin 1250 units/hr is substherapeutic. No issues with infusion or access. Hgb 8.2 - stable. Plt wnl. No noted bleeding.   Goal of Therapy:  Heparin level 0.3-0.7 units/ml Monitor platelets by anticoagulation protocol: Yes   Plan:  Increase heparin to 1350 units/hr  Check 8 hr heparin level Monitor heparin level, CBC and s/s of bleeding daily  Follow up transition to oral anticoagulant   Cristela Felt, PharmD Clinical Pharmacist  11/06/2020, 9:57 AM

## 2020-11-06 NOTE — Progress Notes (Signed)
NAME:  Devin [name], MRN:  [id number], DOB:  Dec 19, 1940, LOS: 6 ADMISSION DATE:  10/31/2020, CONSULTATION DATE:  5/3 REFERRING MD:  Verlon Au, CHIEF COMPLAINT:  Dyspnea   History of Present Illness:  80 y/o had a fall and spinal fracture with post admission course complicated by prolonged mechanical ventilation and 2 extubation failures, ultimately requiring a tracheostomy.  There was concern for supra and sub-glottic airway narrowing.  XL tracheostomy ultimately placed on 5/3.  Moved to Rehab and has been working with them there.  On 5/24 [name] underwent PEG tube placement in the OR with Dr. Donne Hazel without incident. PCCM was called to PACU for admission for post-op hypotension. ABG 7.25/68/144/30. HGB on iStat 6.2. HGB post op 7.1. On 195 phenylephrine. PCCM to admit.  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 Was able to bypass with distal XLT. 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 Voice quality better. Feeling stronger   5/16 foley placed by urology. working with OT, talking   5/19 capping trials with tracheostomy  5/24 PEG placement in OR. PCCM reconsulted on for hypotension. 6.0 XLTD cuffless swithced to 6.0 XLTD cuffed. CVC.  Went to OR for ex lap, washout, ligation of omental artery bleed  5/25 stabilized, start PS  trials  5/26>5/30 Not tolerating trach collar trials for >1 hour  Interim History / Subjective:  Increased secretions  Objective   Blood pressure (!) 115/49, pulse 76, temperature (!) 97.4 F (36.3 C), temperature source Oral, resp. rate (!) 22, weight 90.6 kg, SpO2 100 %.    Vent Mode: PSV;CPAP FiO2 (%):  [30 %-40 %] 40 % Set Rate:  [22 bmp] 22 bmp PEEP:  [5 cmH20] 5 cmH20 Pressure Support:  [10 cmH20] 10 cmH20 Plateau Pressure:  [17 cmH20] 17 cmH20   Intake/Output Summary (Last 24 hours) at 11/06/2020 1038 Last data filed at 11/06/2020 0800 Gross per 24 hour  Intake 2038.39 ml  Output 1375 ml  Net 663.39 ml   Filed Weights   11/04/20 0409 11/05/20 0500 11/06/20 0500  Weight: 88.5 kg 90.7 kg 90.6 kg   Physical Exam: General: Well-appearing, no acute distress HENT: [place], AT, OP clear, MMM Neck: Trach in place, c/d/i Eyes: EOMI, no scleral icterus Respiratory: Coarse breath sounds bilaterally.  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: repeat ENT scope 5/25 showing no obstruction but "had copious pooled secretions in bilateral pyriform recesses as well as silent aspiration." Placed back on  vent in setting of ABLA and hemorrhagic shock 5/24 - PS and trach collar trial as needed - Full vent support - Nebulizers: Brovana, Pulmicort and PRN Duonebs - VAP - PT/OT  - CXR  Hemorrhagic shock- due to post-PEG omental bleed s/p  washout, ongoing pain issues Stable hemoglobin - Trend CBC - Transfuse for Hg goal >7 - Restarted systemic heparin on 5/29 - Serial monitoring of peri-incisional induration  DM2- variable sugars - SSI  Hx Afib on AC PTA - Consider starting home apixaban tomorrow if stable  - Monitor Hg tonight  Anxiety - Klonopin 0.25 mg AM PRN - Scheduled Klonopin 0.5 mg hs - PRN ativan q6h - Continue Paxil 10 mg daily  Hypothyroidism - Synthroid 125 mcg daily  Deconditioning- PT/OT  Insomnia - Trazodone nightly   Best practice (right click and "Reselect all SmartList Selections" daily)  Diet:  TF Pain/Anxiety/Delirium protocol (if indicated): N/A VAP protocol (if indicated): Yes DVT prophylaxis: SCDs GI prophylaxis: PPI Glucose control:  SSI yes Central venous access:  Needs Arterial line:  Removed Foley:  Yes, and needs: urology placed Mobility:  bedrest PT consulted: yes Last date of multidisciplinary goals of care discussion 5/24, see Dr. Anastasia Pall note Code Status:  DNR Disposition: ICU. Difficult to place  The patient is critically ill with multiple organ systems failure and requires high complexity decision making for assessment and support, frequent evaluation and titration of therapies, application of advanced monitoring technologies and extensive interpretation of multiple databases.  Independent Critical Care Time: 35 Minutes.   Rodman Pickle, M.D. [facility] Pulmonary/Critical Care Medicine 11/06/2020 10:38 AM   Please see Amion for pager number to reach on-call Pulmonary and Critical Care Team.

## 2020-11-06 NOTE — Progress Notes (Signed)
Daily Progress Note   Patient Name: Devin [name]       Date: 11/06/2020 DOB: 10-Dec-1940  Age: 80 y.o. MRN#: [id number] Attending Physician: Margaretha Seeds, MD Primary Care Physician: Prince Solian, MD Admit Date: 10/31/2020  Reason for Consultation/Follow-up: Psycho-social and spiritual support To discuss complex medical decision making related to patient's goals of care  Subjective: Discussed with Respiratory therapy.  Patient has been on trach collar all day.  He will be put back on the vent at night, but team is hopeful that he can get off the vent - and eventually stay on trach collar full time.  Went to bedside.  [name] is in a sound sleep.  I said his name a couple of times and rubbed his arm but he did not wake.  He must be exhausted.   Will allow him to rest and heal.   Assessment:  Patient very fragile after a prolonged MCH/CIR/MCH ordeal with multiple procedures.  Seems like the big question is whether or not his body and mind have enough resilience/reserve to improve and recover functionality.     Patient Profile/HPI:  80 y.o.male"Bill"with past medical history of diabetes, thyroid cancer, prostate cancer with recurrence, osteoporosis, Crohn's disease, stage I lung cancer status post left upper lobe lobectomy, atrial fibrillation, ankylosing spondylitisand previous spinal fracture, recurrent aspiration pneumoniawho was originallyadmitted on4/08/2020 with excruciating pain and SOB after a fall with a spinal fracture of T11, and a large pleural effusion. He underwent neurosurgery repair on 4/8. He had a prolonged intubation with two failed extubations and received tracheostomy and was gradually transitioned to trach collar. He was discharged to CIR on 10/06/2020. He was  readmitted into [facility] on 10/07/2020 with aspiration pneumonia with bilateral effusions, confusion and hypoglycemia (due to dislodged cor trak and continued insulin). The cor trak was replaced and a trach exchange revealed subglottic stenosis with tracheomalacia. The increased tissue on the wall of the trachea was biopsied and found to be inflammatory. His hypoglycemia and encephalopathy resolved and he was discharged/readmitted back to CIR on 10/12/2020. Over time in CIR patient was able to walk 120' with RW and some brakes. He was having difficulty with dysphagia and SLP recommended he be kept NPO. He was subsequently admitted back to [facility] for open gastrostomy placement on 10/31/2020. Unfortunately after placement he suffered  an omental artery bleed with hemorrhagic shock and was admitted to [facility]. He returned to the OR for an exploratory laparoscopy, and ligation of the omental artery bleed. He stabilized on 11/01/2020. ENT evaluation showed copious pooled secretions In the bilateral pyriform recesses and well as silent aspiration.    Length of Stay: 6   Vital Signs: BP (!) 107/43   Pulse 81   Temp 98 F (36.7 C) (Oral)   Resp (!) 24   Wt 90.6 kg   SpO2 100%   BMI 27.86 kg/m  SpO2: SpO2: 100 % O2 Device: O2 Device: Tracheostomy Collar O2 Flow Rate: O2 Flow Rate (L/min): 10 L/min       Palliative Assessment/Data:  20%     Palliative Care Plan    Recommendations/Plan:  PMT will continue to intermittently check in on Bill and the family for support and to answer any questions they may have.  Will request Palliative Chaplain to continue to follow patient and family intermittently as well.  Code Status:  DNR  Prognosis:   Unable to determine.  Concerned that patient is at high risk for acute decompensation and unfortunately even death.   Discharge Planning:  Hopeful for CIR or to patient's home.  No SNF or LTAC   Thank you for allowing the Palliative Medicine Team to  assist in the care of this patient.  Total time spent:  15 min.     Greater than 50%  of this time was spent counseling and coordinating care related to the above assessment and plan.  Florentina Jenny, PA-C Palliative Medicine  Please contact Palliative MedicineTeam phone at [phone] for questions and concerns between 7 am - 7 pm.   Please see AMION for individual provider pager numbers.

## 2020-11-06 NOTE — Progress Notes (Signed)
Inpatient Rehabilitation Admissions Coordinator  I am following patient's progress at a distance until weaned to assist with planning dispo.  Danne Baxter, RN, MSN Rehab Admissions Coordinator [phone] 11/06/2020 12:23 PM

## 2020-11-07 DIAGNOSIS — J9621 Acute and chronic respiratory failure with hypoxia: Secondary | ICD-10-CM

## 2020-11-07 LAB — GLUCOSE, CAPILLARY
Glucose-Capillary: 139 mg/dL — ABNORMAL HIGH (ref 70–99)
Glucose-Capillary: 163 mg/dL — ABNORMAL HIGH (ref 70–99)
Glucose-Capillary: 164 mg/dL — ABNORMAL HIGH (ref 70–99)
Glucose-Capillary: 127 mg/dL — ABNORMAL HIGH (ref 70–99)
Glucose-Capillary: 140 mg/dL — ABNORMAL HIGH (ref 70–99)
Glucose-Capillary: 113 mg/dL — ABNORMAL HIGH (ref 70–99)

## 2020-11-07 LAB — BASIC METABOLIC PANEL
Anion gap: 8 (ref 5–15)
BUN: 30 mg/dL — ABNORMAL HIGH (ref 8–23)
CO2: 22 mmol/L (ref 22–32)
Calcium: 8.4 mg/dL — ABNORMAL LOW (ref 8.9–10.3)
Chloride: 102 mmol/L (ref 98–111)
Creatinine, Ser: 0.9 mg/dL (ref 0.61–1.24)
GFR, Estimated: >60 mL/min (ref >60)
Glucose, Bld: 135 mg/dL — ABNORMAL HIGH (ref 70–99)
Potassium: 4.4 mmol/L (ref 3.5–5.1)
Sodium: 132 mmol/L — ABNORMAL LOW (ref 135–145)

## 2020-11-07 LAB — CBC
HCT: 23.9 % — ABNORMAL LOW (ref 39.0–52.0)
Hemoglobin: 7.5 g/dL — ABNORMAL LOW (ref 13.0–17.0)
MCH: 30.7 pg (ref 26.0–34.0)
MCHC: 31.4 g/dL (ref 30.0–36.0)
MCV: 98 fL (ref 80.0–100.0)
Platelets: 288 10*3/uL (ref 150–400)
RBC: 2.44 MIL/uL — ABNORMAL LOW (ref 4.22–5.81)
RDW: 15.9 % — ABNORMAL HIGH (ref 11.5–15.5)
WBC: 11.9 10*3/uL — ABNORMAL HIGH (ref 4.0–10.5)
nRBC: 0 % (ref 0.0–0.2)

## 2020-11-07 LAB — HEPARIN LEVEL (UNFRACTIONATED)
Heparin Unfractionated: 0.23 IU/mL — ABNORMAL LOW (ref 0.30–0.70)
Heparin Unfractionated: 0.41 IU/mL (ref 0.30–0.70)

## 2020-11-07 LAB — MAGNESIUM: Magnesium: 1.9 mg/dL (ref 1.7–2.4)

## 2020-11-07 MED ORDER — MAGNESIUM SULFATE 2 GM/50ML IV SOLN
2.0000 g | Freq: Once | INTRAVENOUS | Status: AC
  Administered 2020-11-07: 2 g via INTRAVENOUS
  Filled 2020-11-07: qty 50

## 2020-11-07 MED ORDER — METOPROLOL TARTRATE 5 MG/5ML IV SOLN
5.0000 mg | Freq: Once | INTRAVENOUS | Status: AC
  Administered 2020-11-07: 5 mg via INTRAVENOUS
  Filled 2020-11-07: qty 5

## 2020-11-07 MED ORDER — METOPROLOL TARTRATE 5 MG/5ML IV SOLN
5.0000 mg | INTRAVENOUS | Status: DC | PRN
  Administered 2020-11-09 – 2020-11-14 (×2): 5 mg via INTRAVENOUS
  Filled 2020-11-07 (×3): qty 5

## 2020-11-07 NOTE — Progress Notes (Signed)
7 Days Post-Op   Subjective/Chief Complaint: Smiling this morning.  In a good mood.  On vent.  Abdominal pain is improved from last week.  Tolerating TFs well   Objective: Vital signs in last 24 hours: Temp:  [98 F (36.7 C)-99 F (37.2 C)] 98.8 F (37.1 C) (05/31 0817) Pulse Rate:  [53-142] 142 (05/31 0730) Resp:  [17-34] 17 (05/31 0730) BP: (78-133)/(38-78) 78/59 (05/31 0730) SpO2:  [100 %] 100 % (05/31 0730) FiO2 (%):  [30 %-40 %] 30 % (05/31 0350) Weight:  [90.6 kg] 90.6 kg (05/31 0500) Last BM Date: 11/07/20  Intake/Output from previous day: 05/30 0701 - 05/31 0700 In: 2129.7 [I.V.:329.7; NG/GT:1800] Out: 1300 [Urine:1300] Intake/Output this shift: No intake/output data recorded.  GI: soft, approp tender, nondistended, incision clean with no further bleeding noted, g tube in place, honeycomb dressing removed this morning.  Lab Results:  Recent Labs    11/06/20 0700 11/07/20 0512  WBC 14.9* 11.9*  HGB 8.2* 7.5*  HCT 26.6* 23.9*  PLT 267 288   BMET Recent Labs    11/06/20 0700 11/07/20 0512  NA 132* 132*  K 4.9 4.4  CL 102 102  CO2 25 22  GLUCOSE 122* 135*  BUN 32* 30*  CREATININE 1.00 0.90  CALCIUM 8.2* 8.4*   PT/INR No results for input(s): LABPROT, INR in the last 72 hours. ABG No results for input(s): PHART, HCO3 in the last 72 hours.  Invalid input(s): PCO2, PO2  Studies/Results: DG CHEST PORT 1 VIEW  Result Date: 11/06/2020 CLINICAL DATA:  Cough.  Patient with a tracheostomy. EXAM: PORTABLE CHEST 1 VIEW COMPARISON:  11/01/2020 and older studies. FINDINGS: Hazy and linear opacities at both lung bases partly obscuring hemidiaphragms. This has increased compared to the prior exam. Findings consistent with a combination of small effusions with atelectasis or possibly infection, atelectasis favored. Mid to upper lungs are clear. No pneumothorax. Cardiac silhouette normal in size. No mediastinal or hilar masses. Stable tracheostomy. IMPRESSION: 1.  Increase in lung base opacity since the prior exam consistent with a combination of pleural effusions and atelectasis, less likely infection. No evidence pulmonary edema. Electronically Signed   By: Lajean Manes M.D.   On: 11/06/2020 11:16    Anti-infectives: Anti-infectives (From admission, onward)   None      Assessment/Plan: POD 7 elap/loa/gastrostomy placement followed by reoperation for hemorrhagic shock- Devin [name] -appreciate CCM care -g-tube being used and tolerating well -abdominal exam is stable -hgb stable and now on heparin gtt with no bleeding -will check again on Friday for staple removal.  Otherwise surgically stable at this point  Devin [name] 11/07/2020

## 2020-11-07 NOTE — Progress Notes (Addendum)
NAME:  Devin [name], MRN:  [id number], DOB:  08-16-1940, LOS: 7 ADMISSION DATE:  10/31/2020, CONSULTATION DATE:  5/3 REFERRING MD:  Verlon Au, CHIEF COMPLAINT:  Dyspnea   History of Present Illness:  80 y/o had a fall and spinal fracture with post admission course complicated by prolonged mechanical ventilation and 2 extubation failures, ultimately requiring a tracheostomy.  There was concern for supra and sub-glottic airway narrowing.  XL tracheostomy ultimately placed on 5/3.  Moved to Rehab and has been working with them there.  On 5/24 Devin [name] underwent PEG tube placement in the OR with Dr. Donne Hazel without incident. PCCM was called to PACU for admission for post-op hypotension. ABG 7.25/68/144/30. HGB on iStat 6.2. HGB post op 7.1. On 195 phenylephrine. PCCM to admit.  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 Was able to bypass with distal XLT. 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 Voice quality better. Feeling stronger   5/16 foley placed by urology. working with OT, talking   5/19 capping trials with tracheostomy  5/24 PEG placement in OR. PCCM reconsulted on for hypotension. 6.0 XLTD cuffless swithced to 6.0 XLTD cuffed. CVC.  Went to OR for ex lap, washout, ligation of omental artery bleed  5/25 stabilized, start PS  trials  5/26>5/30 Not tolerating trach collar trials for >1 hour  5/31>> A-fib rate of 160 while OOB., tolerating trach collar  Interim History / Subjective:  Pt, has pain at 5/10. Given Dilaudid, which daughter feels is too strong for the patient HR 160, RVR. Better after 5 mg of Lopressor. Na 132 K 4.4 Mag 1.9 Creatinine 0.90, BUN is 30 Calcium is 8.4, unsure of what his albumin is + 4 L, 1300 cc urine out last 24 hours T max 99 WBC 11.9, HGB 7.5,   Objective   Blood pressure (!) 135/52, pulse (!) 107, temperature 98.8 F (37.1 C), temperature source Oral, resp. rate (!) 24, weight 90.6 kg, SpO2 100 %.    Vent Mode: PRVC FiO2 (%):  [30 %-40 %] 30 % Set Rate:  [22 bmp] 22 bmp Vt Set:  [600 mL] 600 mL PEEP:  [5 cmH20] 5 cmH20 Pressure Support:  [10 cmH20] 10 cmH20 Plateau Pressure:  [22 cmH20-23 cmH20] 23 cmH20   Intake/Output Summary (Last 24 hours) at 11/07/2020 1000 Last data filed at 11/07/2020 0900 Gross per 24 hour  Intake 2048.12 ml  Output 1300 ml  Net 748.12 ml   Filed Weights   11/05/20 0500 11/06/20 0500 11/07/20 0500  Weight: 90.7 kg 90.6 kg 90.6 kg   Physical Exam: General: Well-appearing, no acute distress, currently in a fib with rvr HENT: [place], AT, OP clear, MMM, trach which is secure and intact Neck: Trach in place, c/d/i Eyes: EOMI, no scleral icterus Respiratory: Bilateral chest excursion, Coarse breath sounds bilaterally. Slightly diminished per bases No crackles, wheezing or rales Cardiovascular: A fib with RVR rate of 160, No RMG, no additional heart sounds GI:  S/p ex-lap incision, PEG in place, CDI Extremities:-Edema,-tenderness, no obvious deformities Neuro: Alert to person and place, less aware of time, but recently received dilaudid Skin: Intact, no rashes or bruising Psych: Anxious   Labs/imaging that I havepersonally reviewed  (right click and "Reselect all SmartList Selections" daily)  Na 132 K 4.9 BUN/Cr 32/1.00  WBC 14.9 Hg  8.2 Plt 267  CXR 5/30>> Increase in lung base opacity since the prior exam consistent with a combination of pleural effusions and atelectasis, less likely infection. No evidence pulmonary edema.  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: repeat ENT scope 5/25 showing no obstruction but "had copious pooled secretions in bilateral pyriform recesses as well as silent aspiration." Placed back on vent in setting of ABLA and hemorrhagic shock 5/24 - PS and trach collar trial as able  - Full vent support at night  - Nebulizers: Brovana, Pulmicort and PRN Duonebs - VAP - PT/OT  - CXR 5/31  Hemorrhagic shock- due to post-PEG omental bleed s/p washout, ongoing pain issues HGB drop from 8.2 to 7.5 overnight  - Trend CBC - Transfuse for Hg goal >7 - Restarted systemic heparin on 5/29 - Serial monitoring of peri-incisional induration  DM2- variable sugars - SSI  Hx Afib on AC PTA Rate uncontrolled 5/31 - Consider starting home apixaban 6/1 if rate is better controlled  - Monitor Hg closely, transfuse as needed - Replete mag - Check ionized Calcium in am  - EKG in am and prn   Anxiety - Klonopin 0.25 mg AM PRN - Scheduled Klonopin 0.5 mg hs - PRN ativan q6h - Continue Paxil 10 mg daily  Hypothyroidism - Synthroid 125 mcg daily  Deconditioning-  PT/OT as able  Insomnia - Trazodone nightly   Best practice (right click and "Reselect all SmartList Selections" daily)  Diet:  TF Pain/Anxiety/Delirium protocol (if indicated): N/A VAP protocol (if indicated): Yes DVT prophylaxis: SCDs GI prophylaxis: PPI Glucose control:  SSI yes Central venous access:  Needs Arterial line:  Removed Foley:  Yes, and needs: urology placed Mobility:  bedrest PT consulted: yes Last date of multidisciplinary goals of care discussion 5/24, see Dr. Anastasia Pall  note Code Status:  DNR Disposition: ICU. Difficult to place  The patient is critically ill with multiple organ systems failure and requires high complexity decision making for assessment and support, frequent evaluation and titration of therapies, application of advanced monitoring technologies and extensive interpretation of multiple databases.  Independent Critical Care Time: 50 Minutes.    Magdalen Spatz, MSN, AGACNP-BC [place] for personal pager PCCM on call pager [phone] use only, not for office use 11/07/2020 10:00 AM   Please see Amion for pager number to reach on-call Pulmonary and Critical Care Team.

## 2020-11-07 NOTE — Progress Notes (Signed)
ANTICOAGULATION CONSULT NOTE - Follow Up Consult  Pharmacy Consult for heparin Indication: atrial fibrillation  Labs: Recent Labs    11/05/20 0024 11/05/20 2308 11/06/20 0700 11/06/20 1843 11/07/20 0512  HGB 8.1*  --  8.2*  --  7.5*  HCT 24.9*  --  26.6*  --  23.9*  PLT 204  --  267  --  288  HEPARINUNFRC  --    < > 0.23* 0.27* 0.23*  CREATININE 1.00  --  1.00  --   --    < > = values in this interval not displayed.    Assessment: 80yo male remains subtherapeutic on heparin with lower heparin level despite increased rate; no gtt issues or signs of bleeding per RN; noted Hgb trending down.  Goal of Therapy:  Heparin level 0.3-0.7 units/ml   Plan:  Will increase heparin gtt by 1-2 units/kg/hr to 1600 units/hr and check level in 8 hours.    Wynona Neat, PharmD, BCPS  11/07/2020,6:05 AM

## 2020-11-07 NOTE — Progress Notes (Signed)
RT Note:  Placed patient on full support ventilation for HS rest.

## 2020-11-07 NOTE — Progress Notes (Signed)
eLink Physician-Brief Progress Note Patient Name: Devin [name] DOB: 05/18/1941 MRN: [id number]   Date of Service  11/07/2020  HPI/Events of Note  Nursing reports HR jumping up to 150's transiently. Now HR - 87%. Review of EKG reveals sinus rhythm with short PR. Low voltage QRS. Incomplete right bundle branch block. Inferior infarct , age undetermined.  eICU Interventions  Plan: 1. BMP and Mg++ level now.      Intervention Category Major Interventions: Arrhythmia - evaluation and management  Noah Lembke [place] 11/07/2020, 5:40 AM

## 2020-11-07 NOTE — Progress Notes (Signed)
[place] for heparin Indication: atrial fibrillation  No Known Allergies  Patient Measurements: Weight: 90.6 kg (199 lb 11.8 oz) Heparin Dosing Weight: 85 kg  Vital Signs: Temp: 98.8 F (37.1 C) (05/31 1116) Temp Source: Oral (05/31 1116) BP: 99/39 (05/31 1400) Pulse Rate: 87 (05/31 1412)  Labs: Recent Labs    11/05/20 0024 11/05/20 2308 11/06/20 0700 11/06/20 1843 11/07/20 0512 11/07/20 1413  HGB 8.1*  --  8.2*  --  7.5*  --   HCT 24.9*  --  26.6*  --  23.9*  --   PLT 204  --  267  --  288  --   HEPARINUNFRC  --    < > 0.23* 0.27* 0.23* 0.41  CREATININE 1.00  --  1.00  --  0.90  --    < > = values in this interval not displayed.    Estimated Creatinine Clearance: 79.1 mL/min (by C-G formula based on SCr of 0.9 mg/dL).   Assessment: 80 year old male with history Afib on apixaban (last dose 5/19 am). Pharmacy is consulted to restart heparin drip after PEG tube placement on 6/81 which was complicated by hemorrhagic shock.   Heparin level 0.41 on 1600 units/hr which is therapeutic. No issues with infusion or access. Hgb 7.5 which is stable (has been running between 7-8s). Plt wnl. No bleeding or issues with infusion per discussion with RN.   Goal of Therapy:  Heparin level 0.3-0.7 units/ml Monitor platelets by anticoagulation protocol: Yes   Plan:  Continue heparin at 1600 units/hr Follow up confirmatory HL in 8 hours Monitor heparin level, CBC and s/s of bleeding daily  Follow up transition to oral anticoagulant   Dimple Nanas, PharmD PGY-1 Acute Care Pharmacy Resident 11/07/2020, 3:12 PM

## 2020-11-07 NOTE — Progress Notes (Signed)
Physical Therapy Treatment Patient Details Name: Devin [name] MRN: [id number] DOB: 11/28/1940 Today's Date: 11/07/2020    History of Present Illness Pt is a 80 yo male admitted from [place] 5/24 to ICU after PEG placement and hypotension with need for repeat exploration and evacuation. Pt remained on vent post op. Pt with complex PMHx recently admitted 09/10/20-10/06/20 after fall with T11 chance fx and L1 compression fx s/p PLIF T11-12 on 4/8. Pt with complex respiratory failure requiring trach 4/12. Pt then transferred to CIR 10/06/20; readmitted to [facility] acute care on 10/07/20 for low blood sugars. Other PMH includes HTN, gout, afib, ankylosing spondylitis, osteoporosis, prostate CA, DM2.    PT Comments    Pt admitted with above diagnosis. Pt was able to sit EOB 8 min with min guard to mod assist at times as pt was lethargic. HR up to 170 bpm therefore laid pt back down and nurse brought HR meds.  Daughter present during session and encourages pt. Will follow acutely.  Pt currently with functional limitations due to balance and endurance deficits. Pt will benefit from skilled PT to increase their independence and safety with mobility to allow discharge to the venue listed below.     Follow Up Recommendations  CIR;Supervision for mobility/OOB     Equipment Recommendations  3in1 (PT);Rolling walker with 5" wheels    Recommendations for Other Services Rehab consult     Precautions / Restrictions Precautions Precautions: Fall;Back Precaution Booklet Issued: No Precaution Comments: vent, trach, PEG, abdominal incision, TLSO Required Braces or Orthoses: Spinal Brace Spinal Brace: Thoracolumbosacral orthotic;Applied in sitting position Restrictions Weight Bearing Restrictions: No    Mobility  Bed Mobility Overal bed mobility: Needs Assistance Bed Mobility: Rolling;Sidelying to Sit;Sit to Sidelying Rolling: Mod assist Sidelying to sit: Mod assist;+2 for physical assistance     Sit to  sidelying: Mod assist;+2 for physical assistance General bed mobility comments: physical assist with cues to roll to right and rise from side with assist to clear legs off surface. Return to supine with assist to control trunk and bring legs to surface. Pt EOB grossly 8 min but denied donning brace in sitting.  HR from 130-170 bpm with sitting.  Pt in afib.  Nurse and NP aware and bringing in meds at close of session.  Pts BP stable.    Transfers                    Ambulation/Gait                 Stairs             Wheelchair Mobility    Modified Rankin (Stroke Patients Only)       Balance Overall balance assessment: Needs assistance Sitting-balance support: Feet supported;No upper extremity supported Sitting balance-Leahy Scale: Fair Sitting balance - Comments: EOB 8 min with mod physical assist as pt lethargic                                    Cognition Arousal/Alertness: Lethargic;Suspect due to medications (had dilaudid prior to session) Behavior During Therapy: Flat affect Overall Cognitive Status: Difficult to assess Area of Impairment: Following commands                     Memory: Decreased recall of precautions Following Commands: Follows one step commands inconsistently;Follows one step commands with increased time  General Comments: pt with flat affect able to state name, not oriented to time. Pt with grimace and difficulty stating what is bothering him but fatigued with EOB and unable to tolerate donning brace or further mobility      Exercises      General Comments General comments (skin integrity, edema, etc.): Daughter present and sat beside pt and encouraged him      Pertinent Vitals/Pain Pain Assessment: Faces Faces Pain Scale: Hurts little more Pain Location: generalized grimace does not endorse back or abdominal pain but discomfort Pain Descriptors / Indicators: Grimacing Pain Intervention(s):  Limited activity within patient's tolerance;Monitored during session;Repositioned;Premedicated before session    Home Living                      Prior Function            PT Goals (current goals can now be found in the care plan section) Acute Rehab PT Goals Patient Stated Goal: pt agreeable to work with therapy but did not state goal Progress towards PT goals: Progressing toward goals    Frequency    Min 3X/week      PT Plan Current plan remains appropriate    Co-evaluation              AM-PAC PT "6 Clicks" Mobility   Outcome Measure  Help needed turning from your back to your side while in a flat bed without using bedrails?: Total Help needed moving from lying on your back to sitting on the side of a flat bed without using bedrails?: Total Help needed moving to and from a bed to a chair (including a wheelchair)?: Total Help needed standing up from a chair using your arms (e.g., wheelchair or bedside chair)?: Total Help needed to walk in hospital room?: Total Help needed climbing 3-5 steps with a railing? : Total 6 Click Score: 6    End of Session   Activity Tolerance: Patient limited by fatigue Patient left: in bed;with call bell/phone within reach;with bed alarm set;with family/visitor present Nurse Communication: Mobility status PT Visit Diagnosis: Difficulty in walking, not elsewhere classified (R26.2);Unsteadiness on feet (R26.81);Other abnormalities of gait and mobility (R26.89);Muscle weakness (generalized) (M62.81) Pain - Right/Left: Right Pain - part of body:  (back)     Time: 4536-4680 PT Time Calculation (min) (ACUTE ONLY): 20 min  Charges:  $Therapeutic Activity: 8-22 mins                     Rayshard Schirtzinger M,PT Acute Rehab Services [phone] (pager)   Alvira Philips 11/07/2020, 12:04 PM

## 2020-11-08 ENCOUNTER — Inpatient Hospital Stay (HOSPITAL_COMMUNITY): Payer: Medicare Other

## 2020-11-08 LAB — CBC
HCT: 28 % — ABNORMAL LOW (ref 39.0–52.0)
Hemoglobin: 8.5 g/dL — ABNORMAL LOW (ref 13.0–17.0)
MCH: 30.8 pg (ref 26.0–34.0)
MCHC: 30.4 g/dL (ref 30.0–36.0)
MCV: 101.4 fL — ABNORMAL HIGH (ref 80.0–100.0)
Platelets: 330 10*3/uL (ref 150–400)
RBC: 2.76 MIL/uL — ABNORMAL LOW (ref 4.22–5.81)
RDW: 16.1 % — ABNORMAL HIGH (ref 11.5–15.5)
WBC: 8.9 10*3/uL (ref 4.0–10.5)
nRBC: 0 % (ref 0.0–0.2)

## 2020-11-08 LAB — GLUCOSE, CAPILLARY
Glucose-Capillary: 135 mg/dL — ABNORMAL HIGH (ref 70–99)
Glucose-Capillary: 143 mg/dL — ABNORMAL HIGH (ref 70–99)
Glucose-Capillary: 148 mg/dL — ABNORMAL HIGH (ref 70–99)
Glucose-Capillary: 154 mg/dL — ABNORMAL HIGH (ref 70–99)
Glucose-Capillary: 155 mg/dL — ABNORMAL HIGH (ref 70–99)
Glucose-Capillary: 162 mg/dL — ABNORMAL HIGH (ref 70–99)

## 2020-11-08 LAB — BASIC METABOLIC PANEL
Anion gap: 7 (ref 5–15)
BUN: 27 mg/dL — ABNORMAL HIGH (ref 8–23)
CO2: 26 mmol/L (ref 22–32)
Calcium: 8.8 mg/dL — ABNORMAL LOW (ref 8.9–10.3)
Chloride: 102 mmol/L (ref 98–111)
Creatinine, Ser: 0.91 mg/dL (ref 0.61–1.24)
GFR, Estimated: >60 mL/min (ref >60)
Glucose, Bld: 121 mg/dL — ABNORMAL HIGH (ref 70–99)
Potassium: 5 mmol/L (ref 3.5–5.1)
Sodium: 135 mmol/L (ref 135–145)

## 2020-11-08 LAB — HEPATIC FUNCTION PANEL
ALT: 13 U/L (ref 0–44)
AST: 14 U/L — ABNORMAL LOW (ref 15–41)
Albumin: 2.6 g/dL — ABNORMAL LOW (ref 3.5–5.0)
Alkaline Phosphatase: 78 U/L (ref 38–126)
Bilirubin, Direct: <0.1 mg/dL (ref 0.0–0.2)
Total Bilirubin: 0.5 mg/dL (ref 0.3–1.2)
Total Protein: 5.8 g/dL — ABNORMAL LOW (ref 6.5–8.1)

## 2020-11-08 LAB — HEPARIN LEVEL (UNFRACTIONATED): Heparin Unfractionated: 0.51 IU/mL (ref 0.30–0.70)

## 2020-11-08 LAB — BRAIN NATRIURETIC PEPTIDE: B Natriuretic Peptide: 269.8 pg/mL — ABNORMAL HIGH (ref 0.0–100.0)

## 2020-11-08 LAB — MAGNESIUM: Magnesium: 2.2 mg/dL (ref 1.7–2.4)

## 2020-11-08 MED ORDER — CLONAZEPAM 0.5 MG PO TABS
0.5000 mg | ORAL_TABLET | Freq: Every day | ORAL | Status: DC | PRN
  Administered 2020-11-09 – 2020-11-17 (×4): 0.5 mg
  Filled 2020-11-08 (×5): qty 1

## 2020-11-08 MED ORDER — ADULT MULTIVITAMIN W/MINERALS CH
1.0000 | ORAL_TABLET | Freq: Every day | ORAL | Status: DC
  Administered 2020-11-08 – 2020-11-20 (×13): 1
  Filled 2020-11-08 (×13): qty 1

## 2020-11-08 MED ORDER — APIXABAN 5 MG PO TABS
5.0000 mg | ORAL_TABLET | Freq: Two times a day (BID) | ORAL | Status: DC
  Administered 2020-11-08 – 2020-11-12 (×9): 5 mg
  Filled 2020-11-08 (×9): qty 1

## 2020-11-08 MED ORDER — GLYCOPYRROLATE 1 MG PO TABS
1.0000 mg | ORAL_TABLET | Freq: Two times a day (BID) | ORAL | Status: DC | PRN
  Administered 2020-11-09 (×2): 1 mg
  Filled 2020-11-08 (×3): qty 1

## 2020-11-08 NOTE — Progress Notes (Signed)
NAME:  Devin [name], MRN:  [id number], DOB:  08/29/1940, LOS: 8 ADMISSION DATE:  10/31/2020, CONSULTATION DATE:  5/3 REFERRING MD:  Verlon Au, CHIEF COMPLAINT:  Dyspnea   History of Present Illness:  80 y/o had a fall and spinal fracture with post admission course complicated by prolonged mechanical ventilation and 2 extubation failures, ultimately requiring a tracheostomy.  There was concern for supra and sub-glottic airway narrowing.  XL tracheostomy ultimately placed on 5/3.  Moved to Rehab and has been working with them there.  On 5/24 underwent PEG tube placement in the OR with Dr. Donne Hazel without incident. PCCM was called to PACU for admission for post-op hypotension. ABG 7.25/68/144/30. HGB on iStat 6.2. HGB post op 7.1. On 195 phenylephrine. PCCM to admit.  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 Was able to bypass with distal XLT. 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 Voice quality better. Feeling stronger   5/16 foley placed by urology. working with OT, talking   5/19 capping trials with tracheostomy  5/24 PEG placement in OR. PCCM reconsulted on for hypotension. 6.0 XLTD cuffless swithced to 6.0 XLTD cuffed. CVC.  Went to OR for ex lap, washout, ligation of omental artery bleed  5/25 stabilized, start PS trials  5/26 - 5/30 Not  tolerating trach collar trials for >1 hour  5/31 A-fib rate of 160 while OOB., tolerating trach collar  Interim History / Subjective:  No acute events overnight States he feels "okay"  Objective   Blood pressure (!) 108/53, pulse 76, temperature 99.6 F (37.6 C), temperature source Oral, resp. rate (!) 30, weight 90.6 kg, SpO2 100 %.    Vent Mode: PRVC FiO2 (%):  [28 %-40 %] 30 % Set Rate:  [22 bmp-30 bmp] 30 bmp Vt Set:  [600 mL] 600 mL PEEP:  [5 cmH20] 5 cmH20 Plateau Pressure:  [22 cmH20-25 cmH20] 23 cmH20   Intake/Output Summary (Last 24 hours) at 11/08/2020 0626 Last data filed at 11/08/2020 0900 Gross per 24 hour  Intake 2222.2 ml  Output 1800 ml  Net 422.2 ml   Filed Weights   11/06/20 0500 11/07/20 0500 11/08/20 0500  Weight: 90.6 kg 90.6 kg 90.6 kg   Physical Exam: General: Acute on chronically ill appearing elderly male lying in bed on mechanical ventilation through trach, in NAD HEENT: 6X:T distal trach midline, MM pink/moist, PERRL,  Neuro: Alert and oriented x3, non-focal  CV: s1s2 regular rate and rhythm, no murmur, rubs, or gallops,  PULM:  Clear to ascultation no added breath sounds no increased work of breathing GI: soft, bowel sounds active in all 4 quadrants, non-tender, non-distended, tolerating TF Extremities: warm/dry, generalized edema  Skin: no rashes or lesions  Labs/imaging that I have personally reviewed    CXR 6/1 Persistent bibasilar atelectasis/infiltrates. Small left pleural effusion  Resolved Hospital Problem list   Hemorrhagic shock  Assessment & Plan:   Acute on Chronic Respiratory Failure with Hypercarbia Tracheostomy status due to  supraglottic and subglottic stenosis -Repeat ENT scope 5/25 showing no obstruction but "had copious pooled secretions in bilateral pyriform recesses as well as silent aspiration." -Placed back on vent in setting of ABLA and hemorrhagic shock 5/24 P: Continue ventilator support with lung protective  strategies  Continued attempts to place back on ATC Wean PEEP and FiO2 for sats greater than 90%. Head of bed elevated 30 degrees. Plateau pressures less than 30 cm H20.  Follow intermittent chest x-ray and ABG.   Ensure adequate pulmonary hygiene  Follow cultures  VAP bundle in place  PAD protocol Continue BDs  Acute blood loss anemia  -Due to post-PEG omental bleed s/p washout, ongoing pain issues HGB drop from 8.2 to 7.5 overnight  P: Hgn remains stable at 8.5 Transfuse per protocol  Continue Heparin   DM2 - variable sugars P: Continue SSI CBG checks q4  Hx Afib on AC PTA Rate uncontrolled 5/31 P: Resume home Apixaban, stop heparin  Closely monitor H&H Continuous telemetry  Optimize electrolytes   Anxiety P: Continue Klonipin and Paxil PRN Ativan  Hypothyroidism P: Continue Synthroid   Deconditioning P: PT/OT   Insomnia P: Trazodone qhr   Best practice   Diet:  TF Pain/Anxiety/Delirium protocol (if indicated): N/A VAP protocol (if indicated): Yes DVT prophylaxis: SCDs GI prophylaxis: PPI Glucose control:  SSI yes Central venous access:  Needs Arterial line:  Removed Foley:  Yes, and needs: urology placed Mobility:  bedrest PT consulted: yes Last date of multidisciplinary goals of care discussion 5/24, see Dr. Anastasia Pall note Code Status:  DNR Disposition: ICU. Difficult to place   CRITICAL CARE Performed by: Johnsie Cancel  Total critical care time: 37 minutes  Critical care time was exclusive of separately billable procedures and treating other patients.  Critical care was necessary to treat or prevent imminent or life-threatening deterioration.  Critical care was time spent personally by me on the following activities: development of treatment plan with patient and/or surrogate as well as nursing, discussions with consultants, evaluation of patient's response to treatment, examination of patient, obtaining history from patient or  surrogate, ordering and performing treatments and interventions, ordering and review of laboratory studies, ordering and review of radiographic studies, pulse oximetry and re-evaluation of patient's condition.  Johnsie Cancel, NP-C [facility] Personal contact information can be found on Amion  11/08/2020, 10:11 AM

## 2020-11-08 NOTE — Progress Notes (Signed)
Physical Therapy Treatment Patient Details Name: Devin [name] MRN: [id number] DOB: 1941-01-28 Today's Date: 11/08/2020    History of Present Illness Pt is a 80 yo male admitted from [facility] 5/24 to ICU after PEG placement and hypotension with need for repeat exploration and evacuation. Pt remained on vent post op. Pt with complex PMHx recently admitted 09/10/20-10/06/20 after fall with T11 chance fx and L1 compression fx s/p PLIF T11-12 on 4/8. Pt with complex respiratory failure requiring trach 4/12. Pt then transferred to CIR 10/06/20; readmitted to [facility] acute care on 10/07/20 for low blood sugars. Other PMH includes HTN, gout, afib, ankylosing spondylitis, osteoporosis, prostate CA, DM2.    PT Comments    Pt admitted with above diagnosis. Pt was able to sit EOB 18 min with no assist physically and perform exercises today.  Pt smiling and laughing at times. Pt seems to feel better today.   Pt currently with functional limitations due to balance and endurance deficits. Pt will benefit from skilled PT to increase their independence and safety with mobility to allow discharge to the venue listed below.     Follow Up Recommendations  CIR;Supervision for mobility/OOB     Equipment Recommendations  3in1 (PT);Rolling walker with 5" wheels    Recommendations for Other Services Rehab consult     Precautions / Restrictions Precautions Precautions: Fall;Back Precaution Booklet Issued: No Precaution Comments: vent, trach, PEG, abdominal incision, TLSO Required Braces or Orthoses: Spinal Brace Spinal Brace: Thoracolumbosacral orthotic;Applied in sitting position Restrictions Weight Bearing Restrictions: No    Mobility  Bed Mobility Overal bed mobility: Needs Assistance Bed Mobility: Rolling;Sidelying to Sit;Sit to Sidelying Rolling: Mod assist Sidelying to sit: Mod assist;+2 for physical assistance     Sit to sidelying: Mod assist;+2 for physical assistance General bed mobility comments:  physical assist with cues to roll to right and rise from side with assist to clear legs off surface. Return to supine with assist to control trunk and bring legs to surface. Pt EOB grossly 18 min but denied donning brace in sitting.  HR from 78-95 bpm with sitting.  BP WNL.    Transfers Overall transfer level: Needs assistance   Transfers: Sit to/from Stand Sit to Stand: Min assist         General transfer comment: Partial stand with +2 min assist to scoot to [facility] with use of pad  Ambulation/Gait                 Stairs             Wheelchair Mobility    Modified Rankin (Stroke Patients Only)       Balance Overall balance assessment: Needs assistance Sitting-balance support: Feet supported;No upper extremity supported Sitting balance-Leahy Scale: Fair Sitting balance - Comments: EOB 18 min with no physical assist today. UE support at times by pt   Standing balance support: Bilateral upper extremity supported;During functional activity Standing balance-Leahy Scale: Poor Standing balance comment: Reliant on UE support and external assist                            Cognition Arousal/Alertness: Awake/alert Behavior During Therapy: Flat affect Overall Cognitive Status: Difficult to assess Area of Impairment: Following commands                     Memory: Decreased recall of precautions Following Commands: Follows one step commands inconsistently;Follows one step commands with increased time  General Comments: pt with flat affect able to state name, unable to tolerate donning brace or further mobility      Exercises General Exercises - Upper Extremity Shoulder Flexion: AROM;Both;10 reps Shoulder Horizontal ADduction: AROM;Both;5 reps;Seated General Exercises - Lower Extremity Ankle Circles/Pumps: AROM;Both;10 reps;Seated Long Arc Quad: AROM;Both;Seated Heel Slides: AAROM;Both;10 reps;Supine Hip Flexion/Marching:  AROM;Strengthening;Both;10 reps;Seated Other Exercises Other Exercises: 1 pound weighted dowel rod used for shoulder flexion with pt barely able to lift above neutral in front of pt however he could perform rotation holding the dowel rod.    General Comments        Pertinent Vitals/Pain Pain Assessment: Faces Faces Pain Scale: Hurts little more Pain Location: generalized grimace does not endorse back or abdominal pain but discomfort Pain Descriptors / Indicators: Grimacing Pain Intervention(s): Limited activity within patient's tolerance;Monitored during session;Repositioned    Home Living                      Prior Function            PT Goals (current goals can now be found in the care plan section) Progress towards PT goals: Progressing toward goals    Frequency    Min 3X/week      PT Plan Current plan remains appropriate    Co-evaluation              AM-PAC PT "6 Clicks" Mobility   Outcome Measure  Help needed turning from your back to your side while in a flat bed without using bedrails?: A Lot Help needed moving from lying on your back to sitting on the side of a flat bed without using bedrails?: A Lot Help needed moving to and from a bed to a chair (including a wheelchair)?: Total Help needed standing up from a chair using your arms (e.g., wheelchair or bedside chair)?: Total Help needed to walk in hospital room?: Total Help needed climbing 3-5 steps with a railing? : Total 6 Click Score: 8    End of Session Equipment Utilized During Treatment: Other (comment) (vent at 30% FiO2 with PEEP 5) Activity Tolerance: Patient limited by fatigue;Patient tolerated treatment well Patient left: in bed;with call bell/phone within reach;with bed alarm set;with SCD's reapplied Nurse Communication: Mobility status PT Visit Diagnosis: Difficulty in walking, not elsewhere classified (R26.2);Unsteadiness on feet (R26.81);Other abnormalities of gait and mobility  (R26.89);Muscle weakness (generalized) (M62.81) Pain - Right/Left: Right Pain - part of body:  (back)     Time: 1219-7588 PT Time Calculation (min) (ACUTE ONLY): 23 min  Charges:  $Therapeutic Exercise: 8-22 mins $Therapeutic Activity: 8-22 mins                     Lilya Smitherman M,PT Acute Rehab Services [phone] (pager)   Alvira Philips 11/08/2020, 12:35 PM

## 2020-11-08 NOTE — Progress Notes (Signed)
RT attempted SBT with pt prior to placing on trach collar. Upon placing pt in CPAP/PS 10/5 30% pt with low RR/apneas and low minute volume. Pt placed back into full support. RT will continue to monitor and be available.

## 2020-11-08 NOTE — Progress Notes (Signed)
eLink Physician-Brief Progress Note Patient Name: FINLAY GODBEE DOB: 1940/10/08 MRN: [id number]   Date of Service  11/08/2020  HPI/Events of Note  Patient having frequent loose stools. Bedside RN asking for [employer]. Order.  eICU Interventions  Flexiseal ordered.        Kerry Kass Dominque Marlin 11/08/2020, 9:03 PM

## 2020-11-08 NOTE — Progress Notes (Addendum)
Nutrition Follow-up  DOCUMENTATION CODES:   Non-severe (moderate) malnutrition in context of chronic illness  INTERVENTION:   Continue TF via PEG:  Vital AF 1.2 at 75 ml/h  Provides 2160 kcal, 135 gm protein, 1460 ml free water daily  Add MVI with minerals via tube daily.   NUTRITION DIAGNOSIS:   Moderate Malnutrition related to chronic illness (Crohn's disease, Lung cancer) as evidenced by mild fat depletion,moderate fat depletion,mild muscle depletion,moderate muscle depletion.  Ongoing   GOAL:   Patient will meet greater than or equal to 90% of their needs  Met with TF  MONITOR:   Vent status,TF tolerance  REASON FOR ASSESSMENT:   Ventilator,Consult Enteral/tube feeding initiation and management  ASSESSMENT:   80 yo male admitted from rehab on 5/24 with post op hypotension S/P PEG. PMH includes Crohn's disease, ankylosing spondylitis, osteoporosis, hypothyroidism, DM-2, HTN, prostate CA, A fib. Recent hospital admission 4/3 for spinal fracture s/p fall at home. Admission was complicated by prolonged mechanical ventilation ultimately requiring tracheostomy on 5/3. Transferred to CIR 5/5.  Discussed patient in ICU rounds and with RN today. Patient is now DNR. Trach in place. Remains on vent this morning. Requiring vent support at night, tolerating trach collar some during the day.   Receiving TF via PEG: Vital AF 1.2 at 75 ml/h, tolerating well to meet 100% of estimated needs.  Patient is currently on ventilator support. MV: 17 L/min Temp (24hrs), Avg:98.6 F (37 C), Min:97.2 F (36.2 C), Max:99.7 F (37.6 C)   Labs reviewed.  CBG: 143-135  Medications reviewed and include cholestyramine, pepcid, novolog.  Weights stable over the past 3 days.  I/O +5 L since admission  Diet Order:   Diet Order            Diet NPO time specified  Diet effective now                 EDUCATION NEEDS:   No education needs have been identified at this time  Skin:   Skin Integrity Issues:: Stage II Stage II: vertebral column Other: MASD to perineum  Last BM:  6/1  Height:   Ht Readings from Last 1 Encounters:  10/31/20 _0  (1.803 m)    Weight:   Wt Readings from Last 1 Encounters:  11/08/20 90.6 kg    BMI:  Body mass index is 27.86 kg/m.  Estimated Nutritional Needs:   Kcal:  2160  Protein:  130-140 gm  Fluid:  >/= 2.1 L    Lucas Mallow, RD, LDN, CNSC Please refer to Amion for contact information.

## 2020-11-08 NOTE — Progress Notes (Signed)
ANTICOAGULATION CONSULT NOTE - Follow Up Consult  Pharmacy Consult for heparin Indication: atrial fibrillation  Labs: Recent Labs    11/06/20 0700 11/06/20 1843 11/07/20 0512 11/07/20 1413 11/08/20 0026 11/08/20 0038  HGB 8.2*  --  7.5*  --   --  8.5*  HCT 26.6*  --  23.9*  --   --  28.0*  PLT 267  --  288  --   --  330  HEPARINUNFRC 0.23*   < > 0.23* 0.41 0.51  --   CREATININE 1.00  --  0.90  --   --   --    < > = values in this interval not displayed.    Assessment/Plan:  80yo male remains therapeutic on heparin. Will continue gtt at current rate of 1600 units/hr and monitor daily level.   Wynona Neat, PharmD, BCPS  11/08/2020,1:06 AM

## 2020-11-09 LAB — MAGNESIUM: Magnesium: 1.8 mg/dL (ref 1.7–2.4)

## 2020-11-09 LAB — GLUCOSE, CAPILLARY
Glucose-Capillary: 147 mg/dL — ABNORMAL HIGH (ref 70–99)
Glucose-Capillary: 144 mg/dL — ABNORMAL HIGH (ref 70–99)
Glucose-Capillary: 154 mg/dL — ABNORMAL HIGH (ref 70–99)
Glucose-Capillary: 134 mg/dL — ABNORMAL HIGH (ref 70–99)
Glucose-Capillary: 132 mg/dL — ABNORMAL HIGH (ref 70–99)

## 2020-11-09 LAB — CBC
HCT: 24.4 % — ABNORMAL LOW (ref 39.0–52.0)
Hemoglobin: 7.9 g/dL — ABNORMAL LOW (ref 13.0–17.0)
MCH: 30.7 pg (ref 26.0–34.0)
MCHC: 32.4 g/dL (ref 30.0–36.0)
MCV: 94.9 fL (ref 80.0–100.0)
Platelets: 287 10*3/uL (ref 150–400)
RBC: 2.57 MIL/uL — ABNORMAL LOW (ref 4.22–5.81)
RDW: 16 % — ABNORMAL HIGH (ref 11.5–15.5)
WBC: 8.5 10*3/uL (ref 4.0–10.5)
nRBC: 0 % (ref 0.0–0.2)

## 2020-11-09 LAB — BASIC METABOLIC PANEL
Anion gap: 5 (ref 5–15)
BUN: 30 mg/dL — ABNORMAL HIGH (ref 8–23)
CO2: 26 mmol/L (ref 22–32)
Calcium: 8.6 mg/dL — ABNORMAL LOW (ref 8.9–10.3)
Chloride: 100 mmol/L (ref 98–111)
Creatinine, Ser: 1.02 mg/dL (ref 0.61–1.24)
GFR, Estimated: >60 mL/min (ref >60)
Glucose, Bld: 167 mg/dL — ABNORMAL HIGH (ref 70–99)
Potassium: 3.8 mmol/L (ref 3.5–5.1)
Sodium: 131 mmol/L — ABNORMAL LOW (ref 135–145)

## 2020-11-09 MED ORDER — OXYCODONE HCL 5 MG PO TABS
5.0000 mg | ORAL_TABLET | ORAL | Status: DC | PRN
  Administered 2020-11-09 – 2020-11-20 (×14): 5 mg
  Filled 2020-11-09 (×14): qty 1

## 2020-11-09 MED ORDER — MAGNESIUM SULFATE 2 GM/50ML IV SOLN
2.0000 g | Freq: Once | INTRAVENOUS | Status: AC
  Administered 2020-11-09: 2 g via INTRAVENOUS
  Filled 2020-11-09: qty 50

## 2020-11-09 MED ORDER — NUTRISOURCE FIBER PO PACK
1.0000 | PACK | Freq: Two times a day (BID) | ORAL | Status: DC
  Administered 2020-11-09 – 2020-11-10 (×2): 1
  Filled 2020-11-09 (×2): qty 1

## 2020-11-09 MED ORDER — LACTINEX PO CHEW
1.0000 | CHEWABLE_TABLET | Freq: Three times a day (TID) | ORAL | Status: DC
  Administered 2020-11-09 – 2020-11-10 (×2): 1 via ORAL
  Filled 2020-11-09 (×4): qty 1

## 2020-11-09 MED ORDER — DILTIAZEM 12 MG/ML ORAL SUSPENSION
30.0000 mg | Freq: Four times a day (QID) | ORAL | Status: DC
  Administered 2020-11-09 – 2020-11-20 (×41): 30 mg
  Filled 2020-11-09: qty 6
  Filled 2020-11-09 (×4): qty 3
  Filled 2020-11-09 (×2): qty 6
  Filled 2020-11-09 (×4): qty 3
  Filled 2020-11-09: qty 6
  Filled 2020-11-09 (×3): qty 3
  Filled 2020-11-09 (×3): qty 6
  Filled 2020-11-09 (×5): qty 3
  Filled 2020-11-09: qty 6
  Filled 2020-11-09: qty 3
  Filled 2020-11-09 (×2): qty 6
  Filled 2020-11-09: qty 3
  Filled 2020-11-09: qty 6
  Filled 2020-11-09 (×4): qty 3
  Filled 2020-11-09: qty 6
  Filled 2020-11-09 (×2): qty 3
  Filled 2020-11-09: qty 6
  Filled 2020-11-09 (×8): qty 3
  Filled 2020-11-09: qty 6
  Filled 2020-11-09 (×2): qty 3
  Filled 2020-11-09: qty 6
  Filled 2020-11-09 (×2): qty 3

## 2020-11-09 MED ORDER — ORAL CARE MOUTH RINSE
15.0000 mL | Freq: Four times a day (QID) | OROMUCOSAL | Status: DC
  Administered 2020-11-10 – 2020-11-11 (×6): 15 mL via OROMUCOSAL

## 2020-11-09 MED ORDER — ADENOSINE 6 MG/2ML IV SOLN
INTRAVENOUS | Status: AC
  Filled 2020-11-09: qty 6

## 2020-11-09 MED ORDER — POTASSIUM CHLORIDE 20 MEQ PO PACK
40.0000 meq | PACK | Freq: Once | ORAL | Status: AC
  Administered 2020-11-09: 40 meq
  Filled 2020-11-09: qty 2

## 2020-11-09 MED ORDER — DILTIAZEM HCL 25 MG/5ML IV SOLN
5.0000 mg | Freq: Once | INTRAVENOUS | Status: AC
  Administered 2020-11-09: 5 mg via INTRAVENOUS
  Filled 2020-11-09: qty 5

## 2020-11-09 NOTE — Progress Notes (Signed)
PCCM Progress Note  Called to room by bedside RN for report of SVT vs A-fib RVR. On arrival to room  Patient was seen in no distress on ATC collar with HR of 150-160. He remained hemodynamically stable throughout. Asked Devin [name], bedside nurse to give PRN Beta blocker that dropped heart rate to 130 and rhythm appeared irregular.   Patient has a Hx of A-fib and was on scheduled Cardizem for rate control but when hemorraghic shock developed patients calcium channel blocker was stopped.   Order written for one time push of IV Cardizem followed bu resumptions of scheduled per tube Cardizem.   Devin Cancel, NP-C [facility] Personal contact information can be found on Amion  11/09/2020, 10:43 AM

## 2020-11-09 NOTE — Progress Notes (Signed)
Occupational Therapy Treatment Patient Details Name: Devin [name] MRN: [id number] DOB: 08-Oct-1940 Today's Date: 11/09/2020    History of present illness Pt is a 80 yo male admitted from [place] 5/24 to ICU after PEG placement and hypotension with need for repeat exploration and evacuation. Pt remained on vent post op. Pt with complex PMHx recently admitted 09/10/20-10/06/20 after fall with T11 chance fx and L1 compression fx s/p PLIF T11-12 on 4/8. Pt with complex respiratory failure requiring trach 4/12. Pt then transferred to CIR 10/06/20; readmitted to [facility] acute care on 10/07/20 for low blood sugars. Other PMH includes HTN, gout, afib, ankylosing spondylitis, osteoporosis, prostate CA, DM2.   OT comments  Patient more awake/alert this date compared to previous OT session. Follows 1-step verbal commands with good accuracy and multi-step verbal commands with increased time. Session with focus on bed level therapeutic exercise as nursing staff in room changing dressings and administering meds. Patient also with trial for tolerating trach collar. SpO2 100% on 5L via trach collar throughout limited session. Wife present at bedside and encouraging. Patient smiling appropriately in response to this therapists jokes. Patient found to have large loose BM around flexiseal. RN present to assist with hygiene/clothing management. OT will continue to follow acutely.    Follow Up Recommendations  CIR    Equipment Recommendations  Other (comment) (Defer to next level of care)    Recommendations for Other Services Rehab consult    Precautions / Restrictions Precautions Precautions: Fall;Back Precaution Booklet Issued: No Precaution Comments: O2 via trach collar (trial this date), PEG, abdominal incision, TLSO Required Braces or Orthoses: Spinal Brace Spinal Brace: Thoracolumbosacral orthotic;Applied in sitting position Restrictions Weight Bearing Restrictions: No       Mobility Bed Mobility Overal bed  mobility: Needs Assistance Bed Mobility: Rolling;Sidelying to Sit;Sit to Sidelying Rolling: Mod assist Sidelying to sit: Mod assist;+2 for physical assistance     Sit to sidelying: Mod assist;+2 for physical assistance      Transfers Overall transfer level: Needs assistance                    Balance Overall balance assessment: Needs assistance                                         ADL either performed or assessed with clinical judgement   ADL Overall ADL's : Needs assistance/impaired                             Toileting- Clothing Manipulation and Hygiene: Total assistance;Bed level Toileting - Clothing Manipulation Details (indicate cue type and reason): Large loose BM present in bed 2/2 flexiseal failure. Total A for hygiene/clothing management at bed level.       General ADL Comments: Making slow progress toward goals.     Vision       Perception     Praxis      Cognition Arousal/Alertness: Awake/alert Behavior During Therapy: Flat affect Overall Cognitive Status: Difficult to assess Area of Impairment: Following commands                     Memory: Decreased recall of precautions Following Commands: Follows one step commands consistently;Follows multi-step commands with increased time       General Comments: pt with flat affect able to state name, unable to tolerate  donning brace or further mobility        Exercises Exercises: General Upper Extremity General Exercises - Upper Extremity Shoulder Flexion: AROM;Both;10 reps Elbow Flexion: AROM;Both;10 reps Elbow Extension: AROM;Both;10 reps General Exercises - Lower Extremity Long Arc Quad: AROM;Both;10 reps Heel Slides: Both;10 reps;Supine;AROM   Shoulder Instructions       General Comments      Pertinent Vitals/ Pain       Pain Assessment: Faces Faces Pain Scale: Hurts little more Pain Location: generalized grimace does not endorse back or  abdominal pain but discomfort Pain Descriptors / Indicators: Grimacing Pain Intervention(s): Limited activity within patient's tolerance;Monitored during session;Repositioned  Home Living                                          Prior Functioning/Environment              Frequency  Min 2X/week        Progress Toward Goals  OT Goals(current goals can now be found in the care plan section)  Progress towards OT goals: Progressing toward goals  Acute Rehab OT Goals Patient Stated Goal: In agreement with treatment session. OT Goal Formulation: With patient/family Time For Goal Achievement: 11/17/20 Potential to Achieve Goals: Good ADL Goals Pt Will Perform Grooming: with set-up;sitting Pt Will Perform Lower Body Dressing: with min assist;sit to/from stand Pt Will Transfer to Toilet: with mod assist;bedside commode Pt Will Perform Toileting - Clothing Manipulation and hygiene: with mod assist;sit to/from stand Pt/caregiver will Perform Home Exercise Program: Both right and left upper extremity;With written HEP provided Additional ADL Goal #1: Patient will tolerate EOB activity for 15 minutes in prep for ADLs. Additional ADL Goal #2: Patient will complete 3/3 grooming tasks seated EOB with set-up assist.  Plan Discharge plan remains appropriate;Frequency remains appropriate    Co-evaluation                 AM-PAC OT "6 Clicks" Daily Activity     Outcome Measure   Help from another person eating meals?: Total Help from another person taking care of personal grooming?: A Lot Help from another person toileting, which includes using toliet, bedpan, or urinal?: Total Help from another person bathing (including washing, rinsing, drying)?: Total Help from another person to put on and taking off regular upper body clothing?: A Lot Help from another person to put on and taking off regular lower body clothing?: Total 6 Click Score: 8    End of Session  Equipment Utilized During Treatment: Oxygen (5L via trach collar (trial))  OT Visit Diagnosis: Unsteadiness on feet (R26.81);Muscle weakness (generalized) (M62.81)   Activity Tolerance Patient tolerated treatment well   Patient Left with call bell/phone within reach;in chair;with chair alarm set;with family/visitor present   Nurse Communication          Time: 4650-3546 OT Time Calculation (min): 17 min  Charges: OT General Charges $OT Visit: 1 Visit OT Treatments $Therapeutic Activity: 8-22 mins  Naeema Patlan H. OTR/L Supplemental OT, Department of rehab services [phone]   Lamount Bankson R H. 11/09/2020, 9:57 AM

## 2020-11-09 NOTE — Progress Notes (Addendum)
NAME:  Devin [name], MRN:  [id number], DOB:  01-22-1941, LOS: 9 ADMISSION DATE:  10/31/2020, CONSULTATION DATE:  5/3 REFERRING MD:  Verlon Au, CHIEF COMPLAINT:  Dyspnea   Brief Narrative:   80 y/o had a fall and spinal fracture with post admission course complicated by prolonged mechanical ventilation and 2 extubation failures, ultimately requiring a tracheostomy.  There was concern for supra and sub-glottic airway narrowing.  XL tracheostomy ultimately placed on 5/3.  Moved to Rehab and has been working with them there.  On 5/24 underwent PEG tube placement in the OR with Dr. Donne Hazel without incident. PCCM was called to PACU for admission for post-op hypotension. ABG 7.25/68/144/30. HGB on iStat 6.2. HGB post op 7.1. On 195 phenylephrine. PCCM to admit.  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 Was able to bypass with distal XLT. 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 Voice quality better. Feeling stronger   5/16 foley placed by urology. working with OT, talking   5/19 capping trials with tracheostomy  5/24 PEG placement in OR. PCCM reconsulted on for hypotension. 6.0 XLTD cuffless swithced to 6.0 XLTD cuffed. CVC.  Went to OR for ex lap, washout, ligation of omental artery bleed  5/25 stabilized, start PS trials  5/26 - 5/30 Not tolerating  trach collar trials for >1 hour  5/31 A-fib rate of 160 while OOB., tolerating trach collar  6/1 rate controlled and Eliquis resumed   Interim History / Subjective:  No acute issues overnight  States he feels well  Starting to have multiple loose stools   Objective   Blood pressure 124/67, pulse 85, temperature (!) 97.5 F (36.4 C), temperature source Oral, resp. rate (!) 30, weight 90.6 kg, SpO2 100 %.    Vent Mode: PRVC FiO2 (%):  [30 %] 30 % Set Rate:  [30 bmp] 30 bmp Vt Set:  [600 mL] 600 mL PEEP:  [5 cmH20] 5 cmH20 Plateau Pressure:  [24 cmH20-29 cmH20] 24 cmH20   Intake/Output Summary (Last 24 hours) at 11/09/2020 0827 Last data filed at 11/09/2020 0550 Gross per 24 hour  Intake 1613.51 ml  Output 1425 ml  Net 188.51 ml   Filed Weights   11/07/20 0500 11/08/20 0500 11/09/20 0500  Weight: 90.6 kg 90.6 kg 90.6 kg   Physical Exam: General: Very pleasant elderly male lying in bed on mechanical vent thought trach in NAD HEENT: 6XLT distal trach midline, MM pink/moist, PERRL,  Neuro: Alert and oriented x3, non-focal  CV: s1s2 regular rate and rhythm, no murmur, rubs, or gallops,  PULM:  Clear to ascultation bilaterally, no added breath sounds, no increased work of breathing  GI: soft, bowel sounds active in all 4 quadrants, non-tender, non-distended, tolerating TF Extremities: warm/dry, no edema  Skin: no rashes or lesions  Labs/imaging that I have personally reviewed    CXR 6/1 Persistent bibasilar atelectasis/infiltrates. 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 Head of bed elevated 30 degrees. Plateau pressures less than 30 cm H20.  Follow intermittent chest x-ray and ABG.   Ensure adequate pulmonary hygiene  Follow cultures  VAP bundle in place   Acute blood loss anemia  -Due to post-PEG omental bleed s/p washout, ongoing pain issues HGB drop from 8.2 to 7.5 overnight  P: Hgb remains stable  Transfuse per protocol  Continue home Apixaban   DM2 - variable sugars P: Continue SSI CBG checks q4  Hx Afib on AC PTA Rate uncontrolled 5/31 P: Continue home Apixaban  Continuous telemetry  Optimize electrolytes   Anxiety P: PRN Klonopin   Hypothyroidism P: Continue Synthroid   Deconditioning P: PT/OT Mobilize as able   Insomnia P: Transadone    Best practice   Diet:  TF Pain/Anxiety/Delirium protocol (if indicated): N/A VAP protocol (if indicated): Yes DVT prophylaxis: SCDs GI prophylaxis: PPI Glucose control:  SSI yes Central venous access:  Needs Arterial line:  Removed Foley:  Yes, and needs: urology placed Mobility:  bedrest PT consulted: yes Last date of multidisciplinary goals of care discussion 5/24, see Dr. Anastasia Pall note Code Status:  DNR Disposition: ICU. Difficult to place   CRITICAL CARE TIME: N/A  .Johnsie Cancel, NP-C [facility] Personal contact information can be found on Amion  11/09/2020, 8:27 AM

## 2020-11-10 LAB — GLUCOSE, CAPILLARY
Glucose-Capillary: 143 mg/dL — ABNORMAL HIGH (ref 70–99)
Glucose-Capillary: 146 mg/dL — ABNORMAL HIGH (ref 70–99)
Glucose-Capillary: 152 mg/dL — ABNORMAL HIGH (ref 70–99)
Glucose-Capillary: 158 mg/dL — ABNORMAL HIGH (ref 70–99)
Glucose-Capillary: 164 mg/dL — ABNORMAL HIGH (ref 70–99)
Glucose-Capillary: 165 mg/dL — ABNORMAL HIGH (ref 70–99)
Glucose-Capillary: 168 mg/dL — ABNORMAL HIGH (ref 70–99)

## 2020-11-10 LAB — BASIC METABOLIC PANEL
Anion gap: 6 (ref 5–15)
BUN: 34 mg/dL — ABNORMAL HIGH (ref 8–23)
CO2: 21 mmol/L — ABNORMAL LOW (ref 22–32)
Calcium: 8.7 mg/dL — ABNORMAL LOW (ref 8.9–10.3)
Chloride: 102 mmol/L (ref 98–111)
Creatinine, Ser: 1.05 mg/dL (ref 0.61–1.24)
GFR, Estimated: >60 mL/min (ref >60)
Glucose, Bld: 155 mg/dL — ABNORMAL HIGH (ref 70–99)
Potassium: 4.9 mmol/L (ref 3.5–5.1)
Sodium: 129 mmol/L — ABNORMAL LOW (ref 135–145)

## 2020-11-10 LAB — CBC
HCT: 25.4 % — ABNORMAL LOW (ref 39.0–52.0)
Hemoglobin: 8.4 g/dL — ABNORMAL LOW (ref 13.0–17.0)
MCH: 31.6 pg (ref 26.0–34.0)
MCHC: 33.1 g/dL (ref 30.0–36.0)
MCV: 95.5 fL (ref 80.0–100.0)
Platelets: 343 10*3/uL (ref 150–400)
RBC: 2.66 MIL/uL — ABNORMAL LOW (ref 4.22–5.81)
RDW: 16.1 % — ABNORMAL HIGH (ref 11.5–15.5)
WBC: 9.1 10*3/uL (ref 4.0–10.5)
nRBC: 0 % (ref 0.0–0.2)

## 2020-11-10 LAB — MAGNESIUM: Magnesium: 1.9 mg/dL (ref 1.7–2.4)

## 2020-11-10 MED ORDER — SULFASALAZINE 500 MG PO TABS
500.0000 mg | ORAL_TABLET | Freq: Every day | ORAL | Status: DC
  Administered 2020-11-10 – 2020-11-17 (×8): 500 mg
  Filled 2020-11-10 (×9): qty 1

## 2020-11-10 MED ORDER — GERHARDT'S BUTT CREAM
TOPICAL_CREAM | Freq: Two times a day (BID) | CUTANEOUS | Status: DC
  Administered 2020-11-16 – 2020-11-20 (×4): 1 via TOPICAL
  Filled 2020-11-10 (×2): qty 1

## 2020-11-10 MED ORDER — SULFASALAZINE 500 MG PO TBEC
500.0000 mg | DELAYED_RELEASE_TABLET | Freq: Every day | ORAL | Status: DC
  Filled 2020-11-10: qty 1

## 2020-11-10 NOTE — Progress Notes (Signed)
This chaplain responded to PMT referral for spiritual care. The Pt. sister-Gail is leaving as I walk in.  The Pt. is sitting up in the bedside recliner.  The chaplain recognizes the importance of the written word as the Pt. connects the chaplain with the strength in his family, faith, and spiritual companions at [facility].   The Pt. accepts the chaplain invitation for prayer as he communicates he is tired and needs suctioning.   This chaplain is available for F/U spiritual care as needed, [phone].

## 2020-11-10 NOTE — Progress Notes (Signed)
Pt getting tired, placed back on ventilator on FS to rest. Will let RT know when he wants to try ATC again this afternoon.

## 2020-11-10 NOTE — Progress Notes (Signed)
  Request to resume Pentasa via PEG tube - concern as the beads inside the capsule are controlled release and cannot be crushed. May clog feeding tubes.    Called and checked with [employer] (manufacturer of Pentasa product we carry) and advised that it has not been studied to be given per feeding tube and they do not recommend giving it in this manner. Letter addressing this from the company faxed over and will be added to chart for viewing.   Submitted an additional drug information request to [employer] regarding information available in the Handbook of Drug Administration via Enteral Feeding tubes (2007 version, [employer] [employer]) which sites Pentasa made by [employer] in the [place] and has data to support that granule being given per feeding tube. At this time, awaiting drug information to see if the two products (controlled release beads versus granules) are comparable and if this data could be used to support safely giving per feeding tube. Still awaiting response.   Discussed issue with Dr. Liliane Channel office, [facility], to determine if there is an alternative that could be safely crushed and utilized in the meantime. Kenney Houseman, RN called and advised ok for sulfasalazine [phone] mg/day then adjust to affect [phone]).  Will discuss with care team.   Sloan Leiter, PharmD, BCPS, BCCCP Clinical Pharmacist Please refer to [facility] for [place] numbers 11/10/2020, 9:53 AM

## 2020-11-10 NOTE — Progress Notes (Signed)
10 Days Post-Op   Subjective/Chief Complaint: On trach collar.  No new complaints   Objective: Vital signs in last 24 hours: Temp:  [98.5 F (36.9 C)-99.5 F (37.5 C)] 98.5 F (36.9 C) (06/03 0756) Pulse Rate:  [74-162] 88 (06/03 0900) Resp:  [21-37] 30 (06/03 0900) BP: (96-156)/(43-75) 135/61 (06/03 0900) SpO2:  [100 %] 100 % (06/03 0900) FiO2 (%):  [28 %-30 %] 30 % (06/03 0454) Last BM Date: 11/10/20  Intake/Output from previous day: 06/02 0701 - 06/03 0700 In: 1805 [NG/GT:1725; IV Piggyback:50] Out: 1450 [Urine:1325; Stool:125] Intake/Output this shift: Total I/O In: 75 [NG/GT:75] Out: 275 [Urine:275]  GI: soft, approp tender, nondistended, incision c/d/i with staples, g-tube in place and TFs running with no issues  Lab Results:  Recent Labs    11/09/20 0030 11/10/20 0121  WBC 8.5 9.1  HGB 7.9* 8.4*  HCT 24.4* 25.4*  PLT 287 343   BMET Recent Labs    11/09/20 1037 11/10/20 0121  NA 131* 129*  K 3.8 4.9  CL 100 102  CO2 26 21*  GLUCOSE 167* 155*  BUN 30* 34*  CREATININE 1.02 1.05  CALCIUM 8.6* 8.7*   PT/INR No results for input(s): LABPROT, INR in the last 72 hours. ABG No results for input(s): PHART, HCO3 in the last 72 hours.  Invalid input(s): PCO2, PO2  Studies/Results: No results found.  Anti-infectives: Anti-infectives (From admission, onward)   None      Assessment/Plan: POD 10 elap/loa/gastrostomy placement followed by reoperation for hemorrhagic shock- Devin [name] -appreciate CCM care -g-tube being used and tolerating well -abdominal exam is stable -Dc staples today -he may follow up with [place] as needed, otherwise we will sign off at this time.  Please call if needed  Devin [name] 11/10/2020

## 2020-11-10 NOTE — Progress Notes (Signed)
Occupational Therapy Treatment Patient Details Name: Devin [name] MRN: [id number] DOB: [birth year] Today's Date: 11/10/2020    History of present illness Pt is a 80 yo male admitted from [place] 5/24 to ICU after PEG placement and hypotension with need for repeat exploration and evacuation. Pt remained on vent post op. Pt with complex PMHx recently admitted 09/10/20-10/06/20 after fall with T11 chance fx and L1 compression fx s/p PLIF T11-12 on 4/8. Pt with complex respiratory failure requiring trach 4/12. Pt then transferred to CIR 10/06/20; readmitted to [facility] acute care on 10/07/20 for low blood sugars. Other PMH includes HTN, gout, afib, ankylosing spondylitis, osteoporosis, prostate CA, DM2.   OT comments  Patient progressing toward goals. Sat EOB for 8-10 min with supervision A. Able to suction mouth several times with set-up assist. Daughter present at bedside and very encouraging. Patient progressed from EOB to recliner with Min A +2 this date for line management. Seemingly in better spirits. OT will continue to follow acutely.    Follow Up Recommendations  CIR    Equipment Recommendations  Other (comment) (Defer to next level of care)    Recommendations for Other Services Rehab consult    Precautions / Restrictions Precautions Precautions: Fall;Back Precaution Booklet Issued: No Precaution Comments: Trach via vent, PEG, abdominal incision, TLSO Required Braces or Orthoses: Spinal Brace Spinal Brace: Thoracolumbosacral orthotic;Applied in sitting position;Other (comment) (Did not don TLSO this date 2/2 PEG.) Restrictions Weight Bearing Restrictions: No       Mobility Bed Mobility Overal bed mobility: Needs Assistance Bed Mobility: Rolling;Sidelying to Sit;Sit to Sidelying Rolling: Mod assist Sidelying to sit: Mod assist;+2 for physical assistance;+2 for safety/equipment     Sit to sidelying: Mod assist;+2 for physical assistance General bed mobility comments: Mod A +2 at BLE  and trunk. Requires increased time/effort.    Transfers Overall transfer level: Needs assistance Equipment used: 2 person hand held assist Transfers: Sit to/from Stand Sit to Stand: Min assist;+2 safety/equipment Stand pivot transfers: Min assist;+2 safety/equipment       General transfer comment: Min A for sit to stand from EOB with HHA +2. Able to take several steps to recliner on R with Min A.    Balance Overall balance assessment: Needs assistance Sitting-balance support: Feet supported;No upper extremity supported Sitting balance-Leahy Scale: Fair Sitting balance - Comments: Maintains static sitting balance at EOB with supervision A for 8-10 min.   Standing balance support: Bilateral upper extremity supported;During functional activity Standing balance-Leahy Scale: Poor Standing balance comment: Reliant on UE support and external assist                           ADL either performed or assessed with clinical judgement   ADL Overall ADL's : Needs assistance/impaired                                       General ADL Comments: Making progress toward goals.     Vision       Perception     Praxis      Cognition Arousal/Alertness: Awake/alert Behavior During Therapy: Flat affect Overall Cognitive Status: Difficult to assess Area of Impairment: Following commands                     Memory: Decreased recall of precautions Following Commands: Follows one step commands consistently;Follows multi-step commands with increased  time       General Comments: Patient attempting to verbalize around trach this date. Smiles appropriately.        Exercises Exercises: General Upper Extremity General Exercises - Upper Extremity Shoulder Flexion: AROM;Both;10 reps Elbow Flexion: AROM;Both;10 reps General Exercises - Lower Extremity Ankle Circles/Pumps: AROM;Both;10 reps;Seated Long Arc Quad: AROM;Both;10 reps;Seated Hip  Flexion/Marching: AROM;Strengthening;Both;10 reps;Seated   Shoulder Instructions       General Comments VSS on trach via vent with FiO2 30%.    Pertinent Vitals/ Pain       Pain Assessment: Faces Faces Pain Scale: Hurts little more Pain Location: generalized grimace does not endorse back or abdominal pain but discomfort Pain Descriptors / Indicators: Grimacing Pain Intervention(s): Limited activity within patient's tolerance;Monitored during session;Repositioned  Home Living                                          Prior Functioning/Environment              Frequency  Min 2X/week        Progress Toward Goals  OT Goals(current goals can now be found in the care plan section)  Progress towards OT goals: Progressing toward goals  Acute Rehab OT Goals Patient Stated Goal: In agreement with treatment session. OT Goal Formulation: With patient/family Time For Goal Achievement: 11/17/20 Potential to Achieve Goals: Good ADL Goals Pt Will Perform Grooming: with set-up;sitting Pt Will Perform Lower Body Dressing: with min assist;sit to/from stand Pt Will Transfer to Toilet: with mod assist;bedside commode Pt Will Perform Toileting - Clothing Manipulation and hygiene: with mod assist;sit to/from stand Pt/caregiver will Perform Home Exercise Program: Both right and left upper extremity;With written HEP provided Additional ADL Goal #1: Patient will tolerate EOB activity for 15 minutes in prep for ADLs. Additional ADL Goal #2: Patient will complete 3/3 grooming tasks seated EOB with set-up assist.  Plan Discharge plan remains appropriate;Frequency remains appropriate    Co-evaluation                 AM-PAC OT "6 Clicks" Daily Activity     Outcome Measure   Help from another person eating meals?: Total Help from another person taking care of personal grooming?: A Little Help from another person toileting, which includes using toliet, bedpan, or  urinal?: Total (foley and flexiseal) Help from another person bathing (including washing, rinsing, drying)?: Total Help from another person to put on and taking off regular upper body clothing?: A Lot Help from another person to put on and taking off regular lower body clothing?: Total 6 Click Score: 9    End of Session Equipment Utilized During Treatment: Oxygen (via vent)  OT Visit Diagnosis: Unsteadiness on feet (R26.81);Muscle weakness (generalized) (M62.81)   Activity Tolerance Patient tolerated treatment well   Patient Left with call bell/phone within reach;in chair;with family/visitor present   Nurse Communication          Time: 4888-9169 OT Time Calculation (min): 22 min  Charges: OT General Charges $OT Visit: 1 Visit OT Treatments $Therapeutic Activity: 8-22 mins  Jourdin Connors H. OTR/L Supplemental OT, Department of rehab services [phone]   Karisa Nesser R H. 11/10/2020, 1:38 PM

## 2020-11-10 NOTE — Plan of Care (Signed)
  Problem: Education: Goal: Knowledge of General Education information will improve Description: Including pain rating scale, medication(s)/side effects and non-pharmacologic comfort measures Outcome: Progressing   Problem: Health Behavior/Discharge Planning: Goal: Ability to manage health-related needs will improve Outcome: Progressing   Problem: Clinical Measurements: Goal: Ability to maintain clinical measurements within normal limits will improve Outcome: Progressing Goal: Will remain free from infection Outcome: Progressing Goal: Diagnostic test results will improve Outcome: Progressing Goal: Respiratory complications will improve Outcome: Progressing Goal: Cardiovascular complication will be avoided Outcome: Progressing   Problem: Activity: Goal: Risk for activity intolerance will decrease Outcome: Progressing   Problem: Nutrition: Goal: Adequate nutrition will be maintained Outcome: Progressing   Problem: Coping: Goal: Level of anxiety will decrease Outcome: Progressing   Problem: Elimination: Goal: Will not experience complications related to bowel motility Outcome: Progressing Goal: Will not experience complications related to urinary retention Outcome: Progressing   Problem: Pain Managment: Goal: General experience of comfort will improve Outcome: Progressing   Problem: Safety: Goal: Ability to remain free from injury will improve Outcome: Progressing   Problem: Skin Integrity: Goal: Risk for impaired skin integrity will decrease Outcome: Progressing   Problem: Activity: Goal: Ability to tolerate increased activity will improve Outcome: Progressing   Problem: Respiratory: Goal: Ability to maintain a clear airway and adequate ventilation will improve Outcome: Progressing

## 2020-11-10 NOTE — Progress Notes (Signed)
Physical Therapy Treatment Patient Details Name: Devin [name] MRN: [id number] DOB: Jun 28, 1940 Today's Date: 11/10/2020    History of Present Illness Pt is a 80 yo male admitted from [place] 5/24 to ICU after PEG placement and hypotension with need for repeat exploration and evacuation. Pt remained on vent post op. Pt with complex PMHx recently admitted 09/10/20-10/06/20 after fall with T11 chance fx and L1 compression fx s/p PLIF T11-12 on 4/8. Pt with complex respiratory failure requiring trach 4/12. Pt then transferred to CIR 10/06/20; readmitted to [facility] acute care on 10/07/20 for low blood sugars. Other PMH includes HTN, gout, afib, ankylosing spondylitis, osteoporosis, prostate CA, DM2.    PT Comments    Pt admitted with above diagnosis. Pt was able to sit EOB x 8 minutes. Was unable to get pt OOB to chair as noted flexi seal leaking and pt had to be laid back down to be cleaned.  Will continue to follow acutely.  Pt currently with functional limitations due to the deficits listed below (see PT Problem List). Pt will benefit from skilled PT to increase their independence and safety with mobility to allow discharge to the venue listed below.     Follow Up Recommendations  CIR;Supervision for mobility/OOB     Equipment Recommendations  3in1 (PT);Rolling walker with 5" wheels    Recommendations for Other Services Rehab consult     Precautions / Restrictions Precautions Precautions: Fall;Back Precaution Booklet Issued: No Precaution Comments: O2 via trach collar (trial this date), PEG, abdominal incision, TLSO Required Braces or Orthoses: Spinal Brace Spinal Brace: Thoracolumbosacral orthotic;Applied in sitting position Restrictions Weight Bearing Restrictions: No    Mobility  Bed Mobility Overal bed mobility: Needs Assistance Bed Mobility: Rolling;Sidelying to Sit;Sit to Sidelying Rolling: Mod assist Sidelying to sit: Mod assist;+2 for physical assistance     Sit to sidelying: Mod  assist;+2 for physical assistance General bed mobility comments: physical assist with cues to roll to left and rise from side with assist to clear legs off surface. Return to supine with assist to control trunk and bring legs to surface. Pt EOB grossly 8 min but denied donning brace in sitting.  HR from 78-95 bpm with sitting.  BP WNL. Unable to get pt OOB to chair as planned as flexi seal was leaking and pt had to be cleaned.    Transfers                    Ambulation/Gait                 Stairs             Wheelchair Mobility    Modified Rankin (Stroke Patients Only)       Balance Overall balance assessment: Needs assistance Sitting-balance support: Feet supported;No upper extremity supported Sitting balance-Leahy Scale: Fair Sitting balance - Comments: EOB 8 min with no physical assist today. UE support at times by pt                                    Cognition Arousal/Alertness: Awake/alert Behavior During Therapy: Flat affect Overall Cognitive Status: Difficult to assess Area of Impairment: Following commands                     Memory: Decreased recall of precautions Following Commands: Follows one step commands consistently;Follows multi-step commands with increased time  General Comments: pt with flat affect able to state name, unable to tolerate donning brace or further mobility      Exercises General Exercises - Upper Extremity Shoulder Flexion: AROM;Both;10 reps Elbow Flexion: AROM;Both;10 reps General Exercises - Lower Extremity Ankle Circles/Pumps: AROM;Both;10 reps;Seated Long Arc Quad: AROM;Both;10 reps;Seated Hip Flexion/Marching: AROM;Strengthening;Both;10 reps;Seated    General Comments General comments (skin integrity, edema, etc.): Sats with sitting EOB on 28% trach collar were 89-93%      Pertinent Vitals/Pain Pain Assessment: Faces Faces Pain Scale: Hurts even more Pain Location:  generalized grimace does not endorse back or abdominal pain but discomfort Pain Descriptors / Indicators: Grimacing Pain Intervention(s): Limited activity within patient's tolerance;Monitored during session;Repositioned    Home Living                      Prior Function            PT Goals (current goals can now be found in the care plan section) Progress towards PT goals: Progressing toward goals    Frequency    Min 3X/week      PT Plan Current plan remains appropriate    Co-evaluation              AM-PAC PT "6 Clicks" Mobility   Outcome Measure  Help needed turning from your back to your side while in a flat bed without using bedrails?: A Lot Help needed moving from lying on your back to sitting on the side of a flat bed without using bedrails?: A Lot Help needed moving to and from a bed to a chair (including a wheelchair)?: Total Help needed standing up from a chair using your arms (e.g., wheelchair or bedside chair)?: Total Help needed to walk in hospital room?: Total Help needed climbing 3-5 steps with a railing? : Total 6 Click Score: 8    End of Session Equipment Utilized During Treatment: Other (comment) (pt on trach collar at 28%) Activity Tolerance: Patient limited by fatigue;Patient tolerated treatment well Patient left: in bed;with call bell/phone within reach;with bed alarm set;with SCD's reapplied Nurse Communication: Mobility status PT Visit Diagnosis: Difficulty in walking, not elsewhere classified (R26.2);Unsteadiness on feet (R26.81);Other abnormalities of gait and mobility (R26.89);Muscle weakness (generalized) (M62.81) Pain - Right/Left: Right Pain - part of body:  (back)     Time: 4098-1191 PT Time Calculation (min) (ACUTE ONLY): 23 min  Charges:  $Therapeutic Exercise: 8-22 mins $Therapeutic Activity: 8-22 mins                     Devin [name] M,PT Acute Rehab Services [phone] (pager)   Devin [name] 11/10/2020,  12:26 PM

## 2020-11-10 NOTE — Progress Notes (Signed)
NAME:  Devin [name], MRN:  [id number], DOB:  12/19/1940, LOS: 38 ADMISSION DATE:  10/31/2020, CONSULTATION DATE:  5/3 REFERRING MD:  Verlon Au, CHIEF COMPLAINT:  Dyspnea   Brief Narrative:   80 y/o had a fall and spinal fracture with post admission course complicated by prolonged mechanical ventilation and 2 extubation failures, ultimately requiring a tracheostomy.  There was concern for supra and sub-glottic airway narrowing.  XL tracheostomy ultimately placed on 5/3.  Moved to Rehab and has been working with them there.  On 5/24 underwent PEG tube placement in the OR with Dr. Donne Hazel without incident. PCCM was called to PACU for admission for post-op hypotension. ABG 7.25/68/144/30. HGB on iStat 6.2. HGB post op 7.1. On 195 phenylephrine. PCCM to admit.  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 Was able to bypass with distal XLT. 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 Voice quality better. Feeling stronger   5/16 foley placed by urology. working with OT, talking   5/19 capping trials with tracheostomy  5/24 PEG placement in OR. PCCM reconsulted on for hypotension. 6.0 XLTD cuffless swithced to 6.0 XLTD cuffed. CVC.  Went to OR for ex lap, washout, ligation of omental artery bleed  5/25 stabilized, start PS trials  5/26 - 5/30 Not tolerating  trach collar trials for >1 hour  5/31 A-fib rate of 160 while OOB., tolerating trach collar  6/1 rate controlled and Eliquis resumed   Tolerating trach collar for short period  Interim History / Subjective:  Tolerated trach collar 11/09/2020 Resistant to getting out due to pain Objective   Blood pressure 135/61, pulse 88, temperature 98.5 F (36.9 C), temperature source Oral, resp. rate (!) 30, weight 90.6 kg, SpO2 100 %.    Vent Mode: PRVC FiO2 (%):  [28 %-30 %] 30 % Set Rate:  [30 bmp] 30 bmp Vt Set:  [600 mL] 600 mL PEEP:  [5 cmH20] 5 cmH20 Plateau Pressure:  [22 cmH20-28 cmH20] 22 cmH20   Intake/Output Summary (Last 24 hours) at 11/10/2020 0300 Last data filed at 11/10/2020 0800 Gross per 24 hour  Intake 1730 ml  Output 1725 ml  Net 5 ml   Filed Weights   11/07/20 0500 11/08/20 0500 11/09/20 0500  Weight: 90.6 kg 90.6 kg 90.6 kg   Physical Exam: General: Elderly male who is awake and alert HEENT: Tracheostomy is in place Neuro: Grossly intact moves all extremities follows commands but is extremely weak CV: Heart sounds are irregularly PULM: Coarse rhonchi bilaterally Vent PRVC FIO2 30% PEEP 5 RATE 30 VT 600  GI: soft, bsx4 active, PEG in place.  Midline abdominal incision with staples still in place GU: Amber urine Extremities: warm/dry, 1+ edema  Skin: no rashes or lesions   Labs/imaging that I have personally reviewed    CXR 6/1 Persistent bibasilar atelectasis/infiltrates. 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 -Repeat ENT scope 5/25 showing no obstruction but "had copious pooled secretions in bilateral pyriform recesses as well as silent aspiration." -Placed back on vent in setting of ABLA and hemorrhagic shock 5/24 P:  Continue current ventilator settings Tolerating  trach collar for increasing amounts of time daily Intermittent chest x-rays last done on 11/21/2020 with no significant change in trach placement atelectasis and small effusion Mobilize as able    Acute blood loss anemia  Recent Labs    11/09/20 0030 11/10/20 0121  HGB 7.9* 8.4*    P: Transfuse per protocol  DM2 CBG (last 3)  Recent Labs    11/10/20 0009 11/10/20 0449 11/10/20 0753  GLUCAP 158* 152* 143*    P: Sliding scale insulin per protocol Hx Afib on AC PTA Rate uncontrolled 5/31 P: Continue anticoagulation, monitor hemoglobin Cardiac monitor  Anxiety P: As needed Klonopin  Hypothyroidism P: Synthroid  Deconditioning P: Mobilize as able complains of pain with mobilization  Insomnia P: Continue trazodone   Best practice   Diet:  TF Pain/Anxiety/Delirium protocol (if indicated): N/A VAP protocol (if indicated): Yes DVT prophylaxis: SCDs GI prophylaxis: PPI Glucose control:  SSI yes Central venous access:  Needs Arterial line:  Removed Foley:  Yes, and needs: urology placed Mobility:  bedrest PT consulted: yes Last date of multidisciplinary goals of care discussion 5/24, see Dr. Anastasia Pall note Code Status:  DNR Disposition: ICU. Difficult to place   CRITICAL CARE TIME: N/A  Richardson Landry Macaiah Mangal ACNP Acute Care Nurse Practitioner [place] Please consult Amion 11/10/2020, 9:27 AM

## 2020-11-10 NOTE — Progress Notes (Signed)
Inpatient Rehabilitation Admissions Coordinator  I continue to follow patient at a distance.  Danne Baxter, RN, MSN Rehab Admissions Coordinator [phone] 11/10/2020 1:37 PM

## 2020-11-11 ENCOUNTER — Inpatient Hospital Stay (HOSPITAL_COMMUNITY): Payer: Medicare Other

## 2020-11-11 LAB — CBC WITH DIFFERENTIAL/PLATELET
Abs Immature Granulocytes: 0.15 10*3/uL — ABNORMAL HIGH (ref 0.00–0.07)
Basophils Absolute: 0 10*3/uL (ref 0.0–0.1)
Basophils Relative: 0 %
Eosinophils Absolute: 0.2 10*3/uL (ref 0.0–0.5)
Eosinophils Relative: 1 %
HCT: 25 % — ABNORMAL LOW (ref 39.0–52.0)
Hemoglobin: 8.1 g/dL — ABNORMAL LOW (ref 13.0–17.0)
Immature Granulocytes: 1 %
Lymphocytes Relative: 7 %
Lymphs Abs: 0.8 10*3/uL (ref 0.7–4.0)
MCH: 31 pg (ref 26.0–34.0)
MCHC: 32.4 g/dL (ref 30.0–36.0)
MCV: 95.8 fL (ref 80.0–100.0)
Monocytes Absolute: 1 10*3/uL (ref 0.1–1.0)
Monocytes Relative: 8 %
Neutro Abs: 10.4 10*3/uL — ABNORMAL HIGH (ref 1.7–7.7)
Neutrophils Relative %: 83 %
Platelets: 338 10*3/uL (ref 150–400)
RBC: 2.61 MIL/uL — ABNORMAL LOW (ref 4.22–5.81)
RDW: 16 % — ABNORMAL HIGH (ref 11.5–15.5)
WBC: 12.6 10*3/uL — ABNORMAL HIGH (ref 4.0–10.5)
nRBC: 0 % (ref 0.0–0.2)

## 2020-11-11 LAB — GLUCOSE, CAPILLARY
Glucose-Capillary: 139 mg/dL — ABNORMAL HIGH (ref 70–99)
Glucose-Capillary: 141 mg/dL — ABNORMAL HIGH (ref 70–99)
Glucose-Capillary: 148 mg/dL — ABNORMAL HIGH (ref 70–99)
Glucose-Capillary: 150 mg/dL — ABNORMAL HIGH (ref 70–99)
Glucose-Capillary: 153 mg/dL — ABNORMAL HIGH (ref 70–99)
Glucose-Capillary: 193 mg/dL — ABNORMAL HIGH (ref 70–99)

## 2020-11-11 LAB — PHOSPHORUS: Phosphorus: 3.2 mg/dL (ref 2.5–4.6)

## 2020-11-11 LAB — MAGNESIUM: Magnesium: 1.8 mg/dL (ref 1.7–2.4)

## 2020-11-11 MED ORDER — ORAL CARE MOUTH RINSE
15.0000 mL | OROMUCOSAL | Status: DC
  Administered 2020-11-11 – 2020-11-16 (×40): 15 mL via OROMUCOSAL

## 2020-11-11 NOTE — Progress Notes (Signed)
NAME:  Devin [name], MRN:  [id number], DOB:  11-15-1940, LOS: 75 ADMISSION DATE:  10/31/2020, CONSULTATION DATE:  5/3 REFERRING MD:  Verlon Au, CHIEF COMPLAINT:  Dyspnea   History of Present Illness:  80 y/o had a fall and spinal fracture with post admission course complicated by prolonged mechanical ventilation and 2 extubation failures, ultimately requiring a tracheostomy.  There was concern for supra and sub-glottic airway narrowing.  XL tracheostomy ultimately placed on 5/3.  Moved to Rehab and has been working with them there.  On 5/24 underwent PEG tube placement in the OR with Dr. Donne Hazel complicated by intra-abdominal bleeding requiring emergent re-exploration.   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 Was able to bypass with distal XLT. 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 Voice quality better. Feeling stronger   5/16 foley placed by urology. working with OT, talking   5/19 capping trials with tracheostomy  5/24 PEG placement in OR. PCCM reconsulted on for hypotension. 6.0 XLTD cuffless swithced to 6.0 XLTD cuffed. CVC.  Went to OR for ex lap, washout, ligation of omental artery bleed  5/25 stabilized, start PS trials  5/26 - 5/30 Not tolerating trach collar trials for >1 hour  5/31 A-fib rate of 160 while OOB., tolerating trach  collar  6/1 rate controlled and Eliquis resumed   Tolerating trach collar for short period .   Interim History / Subjective:  No acute events Trach collar for 4 hours  Objective   Blood pressure (!) 125/54, pulse 75, temperature 99.4 F (37.4 C), temperature source Oral, resp. rate 19, weight 91.1 kg, SpO2 100 %.    Vent Mode: PRVC FiO2 (%):  [30 %] 30 % Set Rate:  [18 bmp-30 bmp] 18 bmp Vt Set:  [600 mL] 600 mL PEEP:  [5 cmH20] 5 cmH20 Plateau Pressure:  [20 cmH20-28 cmH20] 22 cmH20   Intake/Output Summary (Last 24 hours) at 11/11/2020 0754 Last data filed at 11/11/2020 0700 Gross per 24 hour  Intake 1940 ml  Output 2725 ml  Net -785 ml   Filed Weights   11/08/20 0500 11/09/20 0500 11/11/20 0441  Weight: 90.6 kg 90.6 kg 91.1 kg    Examination:  General:  In bed on vent HENT: NCAT trach in place PULM: CTA B, vent supported breathing CV: RRR, no mgr GI: BS+, soft, nontender, midline scar well healed, PEG in place MSK: normal bulk and tone Neuro: awake, following commands, moves all four extremities   Labs/imaging that I havepersonally reviewed  (right click and "Reselect all SmartList Selections" daily)  hgb 8.1 today  Resolved Hospital Problem list   Supraglottic stenosis now resolved Hemorrhagic shock  Assessment & Plan:  Acute on chronic respiratory failure with hypercarbia Tracheal stenosis Continue XLT trach No decannulation until after hosptial discharge Trach collar as long as possible today Vent at night Trach collar per routine  Acute blood loss anemia Monitor for bleeding Transfuse PRBC for Hgb < 7 gm/dL  DM2  SSI accuchecks as ordered  Atrial fibrillation Diltiazem eliquis  Crohn's disease Sulfasalazine per pharmacy note, appreciate their support  Anxiety insomnia paxil trazadone  Hypothyroidism synthroid  Deconditoning PT following   Best practice (right click and "Reselect all SmartList Selections" daily)  Diet:  Tube  Feed  Pain/Anxiety/Delirium protocol (if indicated): No VAP protocol (if indicated): Not indicated DVT prophylaxis: Systemic AC GI prophylaxis: H2B Glucose control:  SSI Yes Central venous access:  N/A Arterial line:  N/A Foley:  Yes, and it is still needed Mobility:  bed rest  PT consulted: Yes Last date of multidisciplinary goals of care discussion [5/24, palliative also following] Code Status:  DNR Disposition: remain in ICU  Labs   CBC: Recent Labs  Lab 11/07/20 0512 11/08/20 0038 11/09/20 0030 11/10/20 0121 11/11/20 0256  WBC 11.9* 8.9 8.5 9.1 12.6*  NEUTROABS  --   --   --   --  10.4*  HGB 7.5* 8.5* 7.9* 8.4* 8.1*  HCT 23.9* 28.0* 24.4* 25.4* 25.0*  MCV 98.0 101.4* 94.9 95.5 95.8  PLT 288 330 287 343 378    Basic Metabolic Panel: Recent Labs  Lab 11/06/20 0700 11/07/20 0512 11/08/20 0038 11/09/20 1037 11/10/20 0121 11/11/20 0256  NA 132* 132* 135 131* 129*  --   K 4.9 4.4 5.0 3.8 4.9  --   CL 102 102 102 100 102  --   CO2 25 22 26 26  21*  --   GLUCOSE 122* 135* 121* 167* 155*  --   BUN 32* 30* 27* 30* 34*  --   CREATININE 1.00 0.90 0.91 1.02 1.05  --   CALCIUM 8.2* 8.4* 8.8* 8.6* 8.7*  --   MG 2.0 1.9 2.2 1.8 1.9 1.8  PHOS  --   --   --   --   --  3.2   GFR: Estimated Creatinine Clearance: 68 mL/min (by C-G formula based on SCr of 1.05 mg/dL). Recent Labs  Lab 11/08/20 0038 11/09/20 0030 11/10/20 0121 11/11/20 0256  WBC 8.9 8.5 9.1 12.6*    Liver Function Tests: Recent Labs  Lab 11/08/20 0038  AST 14*  ALT 13  ALKPHOS 78  BILITOT 0.5  PROT 5.8*  ALBUMIN 2.6*   No results for input(s): LIPASE, AMYLASE in the last 168 hours. No results for input(s): AMMONIA in the last 168 hours.  ABG    Component Value Date/Time   PHART 7.335 (L) 10/31/2020 1730   PCO2ART 42.0 10/31/2020 1730   PO2ART 257 (H) 10/31/2020 1730   HCO3 22.4 10/31/2020 1730   TCO2 24 10/31/2020 1730   ACIDBASEDEF 3.0 (H) 10/31/2020 1730   O2SAT 100.0 10/31/2020  1730     Coagulation Profile: No results for input(s): INR, PROTIME in the last 168 hours.  Cardiac Enzymes: No results for input(s): CKTOTAL, CKMB, CKMBINDEX, TROPONINI in the last 168 hours.  HbA1C: Hgb A1c MFr Bld  Date/Time Value Ref Range Status  09/10/2020 03:33 PM 6.4 (H) 4.8 - 5.6 % Final    Comment:    (NOTE) Pre diabetes:          5.7%-6.4%  Diabetes:              >6.4%  Glycemic control for   <7.0% adults with diabetes   07/03/2020 02:46 PM 6.2 (H) 4.8 - 5.6 % Final    Comment:    (NOTE) Pre diabetes:          5.7%-6.4%  Diabetes:              >  6.4%  Glycemic control for   <7.0% adults with diabetes     CBG: Recent Labs  Lab 11/10/20 1716 11/10/20 1930 11/10/20 2338 11/11/20 0332 11/11/20 0740  GLUCAP 165* 164* 146* 141* 150*     Critical care time: n/a    Roselie Awkward, MD [place] [facility] Pager: [phone] Cell: [phone] If no response, please call [phone] until 7pm After 7:00 pm call Elink  [phone]

## 2020-11-11 NOTE — Progress Notes (Signed)
eLink Physician-Brief Progress Note Patient Name: Devin [name] DOB: 08-18-1940 MRN: [id number]   Date of Service  11/11/2020  HPI/Events of Note  Nursing reports cloudy urine.  eICU Interventions  Plan: 1. UA with reflex microscopic now.      Intervention Category Major Interventions: Other:  Lysle Dingwall 11/11/2020, 10:01 PM

## 2020-11-12 DIAGNOSIS — R069 Unspecified abnormalities of breathing: Secondary | ICD-10-CM

## 2020-11-12 DIAGNOSIS — R918 Other nonspecific abnormal finding of lung field: Secondary | ICD-10-CM

## 2020-11-12 LAB — URINALYSIS, MICROSCOPIC (REFLEX)
Non Squamous Epithelial: NONE SEEN
Squamous Epithelial / HPF: NONE SEEN (ref 0–5)
WBC, UA: >50 WBC/hpf (ref 0–5)

## 2020-11-12 LAB — GLUCOSE, CAPILLARY
Glucose-Capillary: 128 mg/dL — ABNORMAL HIGH (ref 70–99)
Glucose-Capillary: 136 mg/dL — ABNORMAL HIGH (ref 70–99)
Glucose-Capillary: 131 mg/dL — ABNORMAL HIGH (ref 70–99)
Glucose-Capillary: 142 mg/dL — ABNORMAL HIGH (ref 70–99)
Glucose-Capillary: 115 mg/dL — ABNORMAL HIGH (ref 70–99)
Glucose-Capillary: 119 mg/dL — ABNORMAL HIGH (ref 70–99)

## 2020-11-12 LAB — CBC
HCT: 29.9 % — ABNORMAL LOW (ref 39.0–52.0)
HCT: 30.1 % — ABNORMAL LOW (ref 39.0–52.0)
Hemoglobin: 9.5 g/dL — ABNORMAL LOW (ref 13.0–17.0)
Hemoglobin: 9.6 g/dL — ABNORMAL LOW (ref 13.0–17.0)
MCH: 30.9 pg (ref 26.0–34.0)
MCH: 31.1 pg (ref 26.0–34.0)
MCHC: 31.8 g/dL (ref 30.0–36.0)
MCHC: 31.9 g/dL (ref 30.0–36.0)
MCV: 97.4 fL (ref 80.0–100.0)
MCV: 97.4 fL (ref 80.0–100.0)
Platelets: 395 10*3/uL (ref 150–400)
Platelets: 396 10*3/uL (ref 150–400)
RBC: 3.07 MIL/uL — ABNORMAL LOW (ref 4.22–5.81)
RBC: 3.09 MIL/uL — ABNORMAL LOW (ref 4.22–5.81)
RDW: 15.8 % — ABNORMAL HIGH (ref 11.5–15.5)
RDW: 15.8 % — ABNORMAL HIGH (ref 11.5–15.5)
WBC: 16.3 10*3/uL — ABNORMAL HIGH (ref 4.0–10.5)
WBC: 14.5 10*3/uL — ABNORMAL HIGH (ref 4.0–10.5)
nRBC: 0 % (ref 0.0–0.2)
nRBC: 0 % (ref 0.0–0.2)

## 2020-11-12 LAB — URINALYSIS, ROUTINE W REFLEX MICROSCOPIC

## 2020-11-12 LAB — COMPREHENSIVE METABOLIC PANEL
ALT: 10 U/L (ref 0–44)
AST: 12 U/L — ABNORMAL LOW (ref 15–41)
Albumin: 2.4 g/dL — ABNORMAL LOW (ref 3.5–5.0)
Alkaline Phosphatase: 76 U/L (ref 38–126)
Anion gap: 6 (ref 5–15)
BUN: 36 mg/dL — ABNORMAL HIGH (ref 8–23)
CO2: 24 mmol/L (ref 22–32)
Calcium: 9.1 mg/dL (ref 8.9–10.3)
Chloride: 99 mmol/L (ref 98–111)
Creatinine, Ser: 1.01 mg/dL (ref 0.61–1.24)
GFR, Estimated: >60 mL/min (ref >60)
Glucose, Bld: 142 mg/dL — ABNORMAL HIGH (ref 70–99)
Potassium: 4.7 mmol/L (ref 3.5–5.1)
Sodium: 129 mmol/L — ABNORMAL LOW (ref 135–145)
Total Bilirubin: 0.4 mg/dL (ref 0.3–1.2)
Total Protein: 5.7 g/dL — ABNORMAL LOW (ref 6.5–8.1)

## 2020-11-12 LAB — GLOBAL TEG PANEL
CFF Max Amplitude: 34.7 mm — ABNORMAL HIGH (ref 15–32)
CK with Heparinase (R): 6.9 min (ref 4.3–8.3)
Citrated Functional Fibrinogen: 633.2 mg/dL — ABNORMAL HIGH (ref 278–581)
Citrated Kaolin (K): 1 min (ref 0.8–2.1)
Citrated Kaolin (MA): 71 mm — ABNORMAL HIGH (ref 52–69)
Citrated Kaolin (R): 7.5 min (ref 4.6–9.1)
Citrated Kaolin Angle: 78.2 deg — ABNORMAL HIGH (ref 63–78)
Citrated Rapid TEG (MA): 71.8 mm — ABNORMAL HIGH (ref 52–70)

## 2020-11-12 MED ORDER — "THROMBI-PAD 3""X3"" EX PADS"
1.0000 | MEDICATED_PAD | Freq: Once | CUTANEOUS | Status: AC
  Administered 2020-11-13: 1 via TOPICAL
  Filled 2020-11-12: qty 1

## 2020-11-12 MED ORDER — LIDOCAINE HCL (PF) 1 % IJ SOLN
INTRAMUSCULAR | Status: AC
  Filled 2020-11-12: qty 5

## 2020-11-12 MED ORDER — "THROMBI-PAD 3""X3"" EX PADS"
1.0000 | MEDICATED_PAD | Freq: Once | CUTANEOUS | Status: AC
  Administered 2020-11-12: 1 via TOPICAL
  Filled 2020-11-12: qty 1

## 2020-11-12 MED ORDER — SODIUM CHLORIDE 0.9 % IV SOLN
1.0000 g | Freq: Three times a day (TID) | INTRAVENOUS | Status: DC
  Administered 2020-11-12: 1 g via INTRAVENOUS
  Filled 2020-11-12 (×3): qty 1

## 2020-11-12 MED ORDER — SODIUM CHLORIDE 0.9 % IV SOLN
2.0000 g | Freq: Three times a day (TID) | INTRAVENOUS | Status: DC
  Administered 2020-11-12 – 2020-11-14 (×6): 2 g via INTRAVENOUS
  Filled 2020-11-12 (×6): qty 2

## 2020-11-12 NOTE — Progress Notes (Signed)
eLink Physician-Brief Progress Note Patient Name: Devin [name] DOB: Feb 12, 1941 MRN: [id number]   Date of Service  11/12/2020  HPI/Events of Note  Called to assess patient via camera due to blood saturating thrombin-soaked gauze around PEG site. Patient hemodynamically stable. Last Hgb check was at 1922 hrs and stable at 9.6.  He previously required ex-lap with ligation of omental artery after a PEG tube placement. He restarted anticoagulation with Eliquis on 6/1 (for his Afib) -- last dose of this was this AM (now discontinued).  Platelets wnl. No uremia.  Neither andexanet alfa nor [place] have been administered due to so far due to the stability of the patient so far.   eICU Interventions  Trauma surgeon is going to come to bedside now to assess whether he needs to return to the OR.  T&S and H/H ordered.     Intervention Category Intermediate Interventions: Bleeding - evaluation and treatment with blood products  Marily Lente Denson Niccoli 11/12/2020, 10:31 PM

## 2020-11-12 NOTE — Progress Notes (Addendum)
Trauma Response Nurse Note-  Reason for Call / Reason for Trauma activation:   -Surgical patient bleeding around G-tube site. Last dose of eliquis given at 1044am.   Initial Focused Assessment (If applicable, or please see trauma documentation):  -Pt laying in bed. Blood noted on patients gown, blankets, towels and his dressing was saturated in blood.   Interventions:  -TRN called Dr. Grandville Silos. Dr. Grandville Silos stated he will come in and assess.   TRN and primary RN cleaned pt and placed a new dressing, consisting of 3 split gauze pads and tape to secure it.   Dr. Grandville Silos at bedside at 2259 and sutured patient. 1 split gauze placed on top as per provider, after sutures were placed, and secured with tape as a dressing. Primary RN notified.   Plan of Care as of this note:  -Provider to come and assess pt. Possible suture repair at bedside.    The Following (if applicable):    -MD notified: Dr. Grandville Silos    -Time of Page/Time of notification: 2219    -TRN arrival Time: 2224     -End Time: 2310. TRN left bedside at 2310

## 2020-11-12 NOTE — Progress Notes (Signed)
eLink Physician-Brief Progress Note Patient Name: Devin [name] DOB: 03-09-1941 MRN: [id number]   Date of Service  11/12/2020  HPI/Events of Note  UA with many bacteria and > 50 WBC. Nitrite and Leukocyte esterase could not be performed due to large amount of mucus. Given prolonged hospitalization and presence of urinary catheter will treat initially as "complicated" urinary tract infection.   eICU Interventions  Plan: 1. Urine culture now. 2. Cefepime per pharmacy consultation.      Intervention Category Major Interventions: Infection - evaluation and management  Anaijah Augsburger Eugene 11/12/2020, 3:16 AM

## 2020-11-12 NOTE — Progress Notes (Signed)
[facility]  Came back to evaluate Mr. Seitzinger again Still has very slow, mild oozing Remains hemodynamically stable, comfortable, belly remains soft This picture is not consistent with his presentation in the PACU several weeks ago when he had life threatening bleeding At this time risk/cost of Andexxa not merrited Will continue thrombi-pads, monitor CBC and dressing changes Will have night time check on him as well.  Roselie Awkward, MD [place] [facility] Pager: [phone] Cell: [phone] If no response, please call [phone] until 7pm After 7:00 pm call Elink  [phone]

## 2020-11-12 NOTE — Progress Notes (Signed)
[facility]  Developed bleeding from PEG tube insertion site this afternoon. On exam there is now blood clot at the base of the tube with a scant amount of oozing. Per bedside team, significantly improved. Belly soft, patient is comfortable, conversant with stable vital signs. Plan thrombi-pad to be placed between hub and skin. Repeat CBC reviewed, Hgb stable.  Based on stable vitals, exam and CBC does not appear to be life threatening bleeding but will continue to monitor carefully.  Stop eliquis, no plans to restart this admission.  Roselie Awkward, MD [place] [facility] Pager: [phone] Cell: [phone] If no response, please call [phone] until 7pm After 7:00 pm call Elink  [phone]

## 2020-11-12 NOTE — Progress Notes (Signed)
..  Trauma Response Nurse Note-  Reason for Call - Called by Dr. Bobbye Morton to check on this pt- PEG tube has been bleeding aruond insertion site. Primary RN has placed 2 4x4s at site- When I removed them, area of oozing blood around left side of site noted, clot formation starting around right side of area.  Phlebotomy has drawn a TEG and stat CBC, will update Dr. Bobbye Morton Plan of Care as of this note:  -  Event Summary:    -  Rolene Arbour, RN Trauma Response Nurse

## 2020-11-12 NOTE — Progress Notes (Signed)
Daily Progress Note   Patient Name: Devin [name]       Date: 11/12/2020 DOB: 1940/09/28  Age: 80 y.o. MRN#: [id number] Attending Physician: Juanito Doom, MD Primary Care Physician: Prince Solian, MD Admit Date: 10/31/2020  Reason for Consultation/Follow-up: Spiritual and psycho social support.   Chart reviewed.  Patient sat EOB with PT for 8 minutes yesterday.  Today unfortunately he has had bleeding around his PEG site today.  Subjective: Devin [name] gives me a big smile when I enter the room and greet him.  His wife Devin [name] and daughter Devin [name] are at bedside.  RN is caring for his PEG site.  Patient tells me his bowels are working well, urinating well, and it seems he is much more alert now than he was.  Devin [name] explains that she feels he continues to bleed because of his Crohns.  Wife and daughter have no questions.  I provided them with our team contact number and encouraged them to call if we can be of help.   Assessment: Patient very fragile.  Overall seems to be improving, but remains very fragile.     Patient Profile/HPI:  80 y.o. male "Devin [name]"  with past medical history of diabetes, thyroid cancer, prostate cancer with recurrence, osteoporosis, Crohn's disease, stage I lung cancer status post left upper lobe lobectomy, atrial fibrillation, ankylosing spondylitis and previous spinal fracture, recurrent aspiration pneumonia who was originally admitted on 09/10/2020 with excruciating pain and SOB after a fall with a spinal fracture of T11, and a large pleural effusion.  He underwent neurosurgery repair on 4/8.  He had a prolonged intubation with two failed extubations and received tracheostomy and was gradually transitioned to trach collar.  He was discharged to CIR on 10/06/2020.  He was  readmitted into [facility] on 10/07/2020 with aspiration pneumonia with bilateral effusions, confusion and hypoglycemia (due to dislodged cor trak and continued insulin).  The cor trak was replaced and a trach exchange revealed subglottic stenosis with tracheomalacia.  The increased tissue on the wall of the trachea was biopsied and found to be inflammatory.   His hypoglycemia and encephalopathy resolved and he was discharged/readmitted back to CIR on 10/12/2020.  Over time in CIR patient was able to walk 120' with RW and some brakes.  He was having difficulty with dysphagia and SLP recommended he  be kept NPO.  He was subsequently admitted back to [facility] for open gastrostomy placement on 10/31/2020.   Unfortunately after placement he suffered an omental artery bleed with hemorrhagic shock and was admitted to [facility].  He returned to the OR for an exploratory laparoscopy, and ligation of the omental artery bleed. He stabilized on 11/01/2020.  ENT evaluation showed copious pooled secretions  In the bilateral pyriform recesses and well as silent aspiration.    Length of Stay: 12   Vital Signs: BP (!) 135/49   Pulse 93   Temp 98.9 F (37.2 C) (Oral)   Resp (!) 26   Wt 84.7 kg   SpO2 100%   BMI 26.04 kg/m  SpO2: SpO2: 100 % O2 Device: O2 Device: Tracheostomy Collar O2 Flow Rate: O2 Flow Rate (L/min): 5 L/min       Palliative Assessment/Data: 40%     Palliative Care Plan    Recommendations/Plan:  PMT will continue to check in intermittently for support and continuity.  Please call our team if we are needed more immediately.  Code Status:  DNR  Prognosis:   Unable to determine.  Patient is at high risk for acute decline.  Unfortunately he has no reserves left and likely would not be able to survive another significant complication.   Discharge Planning:  To Be Determined.  Family anticipating CIR    Thank you for allowing the Palliative Medicine Team to assist in the care of this patient.  Total  time spent:  15 min.     Greater than 50%  of this time was spent counseling and coordinating care related to the above assessment and plan.  Florentina Jenny, PA-C Palliative Medicine  Please contact Palliative MedicineTeam phone at [phone] for questions and concerns between 7 am - 7 pm.   Please see AMION for individual provider pager numbers.

## 2020-11-12 NOTE — Progress Notes (Signed)
TRN update-   Continues to have bleeding around PEG arfter tThrombo-pad has been placed. Gauze over the PEG has bleeding noted. Dr. Lake Bells on 35M. Will update.

## 2020-11-12 NOTE — Progress Notes (Signed)
NAME:  Devin [name], MRN:  [id number], DOB:  01-01-1941, LOS: 12 ADMISSION DATE:  10/31/2020, CONSULTATION DATE:  5/3 REFERRING MD:  Verlon Au, CHIEF COMPLAINT:  Dyspnea   History of Present Illness:  80 y/o had a fall and spinal fracture with post admission course complicated by prolonged mechanical ventilation and 2 extubation failures, ultimately requiring a tracheostomy.  There was concern for supra and sub-glottic airway narrowing.  XL tracheostomy ultimately placed on 5/3.  Moved to Rehab and has been working with them there.  On 5/24 underwent PEG tube placement in the OR with Dr. Donne Hazel complicated by intra-abdominal bleeding requiring emergent re-exploration.   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 Was able to bypass with distal XLT. 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 Voice quality better. Feeling stronger   5/16 foley placed by urology. working with OT, talking   5/19 capping trials with tracheostomy  5/24 PEG placement in OR. PCCM reconsulted on for hypotension. 6.0 XLTD cuffless swithced to 6.0 XLTD cuffed. CVC.  Went to OR for ex lap, washout, ligation of omental artery bleed  5/25 stabilized, start PS trials  5/26 - 5/30 Not tolerating trach collar trials for >1 hour  5/31 A-fib rate of 160 while OOB., tolerating trach  collar  6/1 rate controlled and Eliquis resumed   Tolerating trach collar for short period . 6/4 4 hours trach collar, new infiltrate, WBC up . 6/5 cefepime >   Interim History / Subjective:   Didn't sleep much Had a lot of phlegm in his chest overnight 6 hours of trach collar yesterday  Objective   Blood pressure (!) 125/53, pulse 84, temperature 97.9 F (36.6 C), temperature source Oral, resp. rate 19, weight 84.7 kg, SpO2 100 %.    Vent Mode: PRVC FiO2 (%):  [30 %] 30 % Set Rate:  [18 bmp] 18 bmp Vt Set:  [600 mL] 600 mL PEEP:  [5 cmH20] 5 cmH20 Plateau Pressure:  [20 cmH20-24 cmH20] 24 cmH20   Intake/Output Summary (Last 24 hours) at 11/12/2020 0743 Last data filed at 11/12/2020 0700 Gross per 24 hour  Intake 1795 ml  Output 2620 ml  Net -825 ml   Filed Weights   11/09/20 0500 11/11/20 0441 11/12/20 0500  Weight: 90.6 kg 91.1 kg 84.7 kg    Examination:  General:  In bed on vent HENT: NCAT trach in place PULM: CTA B, vent supported breathing CV: RRR, no mgr GI: BS+, soft, nontender MSK: normal bulk and tone Neuro: sedated on vent   Labs/imaging that I havepersonally reviewed  (right click and "Reselect all SmartList Selections" daily)  hgb 8.1 today cxr 6/4 > infiltrate left lower lobe   Resolved Hospital Problem list   Supraglottic stenosis now resolved Hemorrhagic shock  Assessment & Plan:  Acute on chronic respiratory failure with hypercarbia Tracheal stenosis 6/5 complains of increased chest congestion, infiltrate, WBC up > HCAP Continue XLT trach Trach collar as long as tolerated  Trach care per routine No decannulation until after hospital discharge Vent at night Agree with cefepime  Acute blood loss anemia Monitor for bleeding Transfuse PRBC for Hgb < 7 gm/dL  DM2 SSI accuchecks  Atrial fibrillation eliquis Diltiazem and eliquis  Crohn's disease Sulfasalazine per pharmacy  note  Anxiety Insomnia paxil trazodone  Hypothyroidism synthroid  Deconditoning PT following  Pyuria and bacturia > asymptomatic, not consistent with UTI by clinical criteria   Best practice (right click and "Reselect all SmartList Selections" daily)  Diet:  Tube Feed  Pain/Anxiety/Delirium protocol (if indicated): No VAP protocol (if indicated): Not indicated DVT prophylaxis: Systemic AC GI prophylaxis: H2B Glucose control:  SSI Yes Central venous access:  N/A Arterial line:  N/A Foley:  Yes, and it is still needed Mobility:  bed rest  PT consulted: Yes Last date of multidisciplinary goals of care discussion [5/24, palliative also following] Code Status:  DNR Disposition: remain in ICU  Labs   CBC: Recent Labs  Lab 11/07/20 0512 11/08/20 0038 11/09/20 0030 11/10/20 0121 11/11/20 0256  WBC 11.9* 8.9 8.5 9.1 12.6*  NEUTROABS  --   --   --   --  10.4*  HGB 7.5* 8.5* 7.9* 8.4* 8.1*  HCT 23.9* 28.0* 24.4* 25.4* 25.0*  MCV 98.0 101.4* 94.9 95.5 95.8  PLT 288 330 287 343 254    Basic Metabolic Panel: Recent Labs  Lab 11/07/20 0512 11/08/20 0038 11/09/20 1037 11/10/20 0121 11/11/20 0256 11/12/20 0136  NA 132* 135 131* 129*  --  129*  K 4.4 5.0 3.8 4.9  --  4.7  CL 102 102 100 102  --  99  CO2 22 26 26  21*  --  24  GLUCOSE 135* 121* 167* 155*  --  142*  BUN 30* 27* 30* 34*  --  36*  CREATININE 0.90 0.91 1.02 1.05  --  1.01  CALCIUM 8.4* 8.8* 8.6* 8.7*  --  9.1  MG 1.9 2.2 1.8 1.9 1.8  --   PHOS  --   --   --   --  3.2  --    GFR: Estimated Creatinine Clearance: 65.2 mL/min (by C-G formula based on SCr of 1.01 mg/dL). Recent Labs  Lab 11/08/20 0038 11/09/20 0030 11/10/20 0121 11/11/20 0256  WBC 8.9 8.5 9.1 12.6*    Liver Function Tests: Recent Labs  Lab 11/08/20 0038 11/12/20 0136  AST 14* 12*  ALT 13 10  ALKPHOS 78 76  BILITOT 0.5 0.4  PROT 5.8* 5.7*  ALBUMIN 2.6* 2.4*   No results for input(s): LIPASE, AMYLASE in the last 168  hours. No results for input(s): AMMONIA in the last 168 hours.  ABG    Component Value Date/Time   PHART 7.335 (L) 10/31/2020 1730   PCO2ART 42.0 10/31/2020 1730   PO2ART 257 (H) 10/31/2020 1730   HCO3 22.4 10/31/2020 1730   TCO2 24 10/31/2020 1730   ACIDBASEDEF 3.0 (H) 10/31/2020 1730   O2SAT 100.0 10/31/2020 1730     Coagulation Profile: No results for input(s): INR, PROTIME in the last 168 hours.  Cardiac Enzymes: No results for input(s): CKTOTAL, CKMB, CKMBINDEX, TROPONINI in the last 168 hours.  HbA1C: Hgb A1c MFr Bld  Date/Time Value Ref Range Status  09/10/2020 03:33 PM 6.4 (H) 4.8 - 5.6 % Final    Comment:    (NOTE) Pre diabetes:          5.7%-6.4%  Diabetes:              >  6.4%  Glycemic control for   <7.0% adults with diabetes   07/03/2020 02:46 PM 6.2 (H) 4.8 - 5.6 % Final    Comment:    (NOTE) Pre diabetes:          5.7%-6.4%  Diabetes:              >6.4%  Glycemic control for   <7.0% adults with diabetes     CBG: Recent Labs  Lab 11/11/20 1558 11/11/20 1929 11/11/20 2331 11/12/20 0320 11/12/20 0735  GLUCAP 193* 148* 139* 128* 136*     Critical care time: n/a    Roselie Awkward, MD [place] [facility] Pager: [phone] Cell: [phone] If no response, please call [phone] until 7pm After 7:00 pm call Elink  [phone]

## 2020-11-12 NOTE — Progress Notes (Signed)
PHARMACY NOTE:  ANTIMICROBIAL RENAL DOSAGE ADJUSTMENT  Current antimicrobial regimen includes a mismatch between antimicrobial dosage and estimated renal function.  As per policy approved by the Pharmacy & Therapeutics and Medical Executive Committees, the antimicrobial dosage will be adjusted accordingly.  Current antimicrobial dosage:  Cefepime 1g q8h  Indication: UTI/HAP  Renal Function:  Estimated Creatinine Clearance: 65.2 mL/min (by C-G formula based on SCr of 1.01 mg/dL).  Antimicrobial dosage has been changed to:  Cefepime 2g q8h  Additional comments:   Thank you for allowing pharmacy to be a part of this patient's care.  Cristela Felt, PharmD Clinical Pharmacist  11/12/2020 1:04 PM

## 2020-11-12 NOTE — Progress Notes (Signed)
Pharmacy Antibiotic Note  Devin [name] is a 80 y.o. male  with possible UTI.  Pharmacy has been consulted for Cefepime dosing.  Plan: Cefepime 1 g IV q8h  Weight: 91.1 kg (200 lb 13.4 oz)  Temp (24hrs), Avg:99 F (37.2 C), Min:98.6 F (37 C), Max:99.4 F (37.4 C)  Recent Labs  Lab 11/07/20 0512 11/08/20 0038 11/09/20 0030 11/09/20 1037 11/10/20 0121 11/11/20 0256 11/12/20 0136  WBC 11.9* 8.9 8.5  --  9.1 12.6*  --   CREATININE 0.90 0.91  --  1.02 1.05  --  1.01    Estimated Creatinine Clearance: 70.7 mL/min (by C-G formula based on SCr of 1.01 mg/dL).    No Known Allergies   Caryl Pina 11/12/2020 3:21 AM

## 2020-11-12 NOTE — Progress Notes (Signed)
Dr. Lake Bells at bedside

## 2020-11-12 NOTE — Progress Notes (Signed)
Patient ID: Devin [name], male   DOB: [birth year], 80 y.o.   MRN: [id number] Notified of further G tube site bleeding. Patient is anticoagulated. I cleaned the area and injected local. I placed a simple 3-0 prolene on each side of the site then a pursestring 3-0 prolene around the site. This achieved good hemostasis. We will F/U.  Georganna Skeans, MD, MPH, FACS Please use [email or web address] to contact on call provider

## 2020-11-13 LAB — HEMOGLOBIN AND HEMATOCRIT, BLOOD
HCT: 26.4 % — ABNORMAL LOW (ref 39.0–52.0)
Hemoglobin: 8.5 g/dL — ABNORMAL LOW (ref 13.0–17.0)

## 2020-11-13 LAB — GLUCOSE, CAPILLARY
Glucose-Capillary: 119 mg/dL — ABNORMAL HIGH (ref 70–99)
Glucose-Capillary: 131 mg/dL — ABNORMAL HIGH (ref 70–99)
Glucose-Capillary: 150 mg/dL — ABNORMAL HIGH (ref 70–99)
Glucose-Capillary: 209 mg/dL — ABNORMAL HIGH (ref 70–99)
Glucose-Capillary: 163 mg/dL — ABNORMAL HIGH (ref 70–99)

## 2020-11-13 LAB — TYPE AND SCREEN
ABO/RH(D): O POS
Antibody Screen: NEGATIVE

## 2020-11-13 MED ORDER — SCOPOLAMINE 1 MG/3DAYS TD PT72
1.0000 | MEDICATED_PATCH | TRANSDERMAL | Status: AC
  Administered 2020-11-13: 1.5 mg via TRANSDERMAL
  Filled 2020-11-13: qty 1

## 2020-11-13 MED ORDER — LIDOCAINE HCL (PF) 1 % IJ SOLN
INTRAMUSCULAR | Status: AC
  Administered 2020-11-13: 5 mL
  Filled 2020-11-13: qty 5

## 2020-11-13 MED ORDER — POTASSIUM CHLORIDE 20 MEQ PO PACK
40.0000 meq | PACK | Freq: Once | ORAL | Status: AC
  Administered 2020-11-13: 40 meq
  Filled 2020-11-13: qty 2

## 2020-11-13 MED ORDER — PANTOPRAZOLE SODIUM 40 MG IV SOLR
40.0000 mg | INTRAVENOUS | Status: DC
  Administered 2020-11-13 – 2020-11-17 (×5): 40 mg via INTRAVENOUS
  Filled 2020-11-13 (×5): qty 40

## 2020-11-13 MED ORDER — FUROSEMIDE 10 MG/ML IJ SOLN
40.0000 mg | Freq: Once | INTRAMUSCULAR | Status: AC
  Administered 2020-11-13: 40 mg via INTRAVENOUS
  Filled 2020-11-13: qty 4

## 2020-11-13 MED ORDER — BETHANECHOL CHLORIDE 10 MG PO TABS
10.0000 mg | ORAL_TABLET | Freq: Four times a day (QID) | ORAL | Status: DC
  Administered 2020-11-13 – 2020-11-20 (×30): 10 mg
  Filled 2020-11-13 (×33): qty 1

## 2020-11-13 NOTE — Progress Notes (Addendum)
NAME:  Devin [name], MRN:  [id number], DOB:  [birth year], LOS: 35 ADMISSION DATE:  10/31/2020, CONSULTATION DATE:  5/3 REFERRING MD:  Verlon Au, CHIEF COMPLAINT:  Dyspnea   History of Present Illness:  80 y/o had a fall and spinal fracture with post admission course complicated by prolonged mechanical ventilation and 2 extubation failures, ultimately requiring a tracheostomy.  There was concern for supra and sub-glottic airway narrowing.  XL tracheostomy ultimately placed on 5/3.  Moved to Rehab and has been working with them there.  On 5/24 underwent PEG tube placement in the OR with Dr. Donne Hazel complicated by intra-abdominal bleeding requiring emergent re-exploration.   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 Was able to bypass with distal XLT. 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 Voice quality better. Feeling stronger   5/16 foley placed by urology. working with OT, talking   5/19 capping trials with tracheostomy  5/24 PEG placement in OR. PCCM reconsulted on for hypotension. 6.0 XLTD cuffless swithced to 6.0 XLTD cuffed. CVC.  Went to OR for ex lap, washout, ligation of omental artery bleed  5/25 stabilized, start PS trials  5/26 - 5/30 Not tolerating trach collar trials for >1 hour  5/31 A-fib rate of 160 while OOB., tolerating trach  collar  6/1 rate controlled and Eliquis resumed   Tolerating trach collar for short period . 6/4 4 hours trach collar, new infiltrate, WBC up . 6/5 cefepime for HCAP.  bleeding around PEG site. Eliquis stopped again. Had to have surg place suture.  . 6/6 resuming PMV trials. Was off vent all night   Interim History / Subjective:  Feels well  Objective   Blood pressure (Abnormal) 119/46, pulse 73, temperature 98.9 F (37.2 C), temperature source Oral, resp. rate (Abnormal) 22, weight 85.8 kg, SpO2 100 %.    Vent Mode: PRVC FiO2 (%):  [28 %-30 %] 28 % Set Rate:  [18 bmp] 18 bmp Vt Set:  [600 mL] 600 mL PEEP:  [5 cmH20] 5 cmH20 Plateau Pressure:  [24 cmH20] 24 cmH20   Intake/Output Summary (Last 24 hours) at 11/13/2020 0723 Last data filed at 11/13/2020 0600 Gross per 24 hour  Intake 1062.97 ml  Output 1962 ml  Net -899.03 ml   Filed Weights   11/11/20 0441 11/12/20 0500 11/13/20 0600  Weight: 91.1 kg 84.7 kg 85.8 kg    Examination:  General debilitated 80 year old WM resting in bed. No distress.  HENT NCAT 6 distal trach. Cuff now deflated. PMV in place. Phonation is excellent coughing sputum up  Pulm scattered rhonchi. No accessory use. 28% ATC Card Regular irreg AF on tele abd soft not tender. PEG site bleeding now resolved. + bowel sounds EXT warm and dry sig dependent edema Neuro intact  GU cnc yellow   Labs/imaging that I havepersonally reviewed  (right click and "Reselect all SmartList Selections" daily)  See below    Resolved Hospital Problem list   Supraglottic stenosis  now resolved Hemorrhagic shock Pyuria and bacturia > asymptomatic, not consistent with UTI by clinical criteria Assessment & Plan:  Acute on chronic respiratory failure with hypercarbia Tracheal stenosis HCAP  Growing rare gpc and rare GVR Plan Day 2 cefepime Cont routine trach care Mobilize Daily ATC as tolerated w/ nocturnal vent  Encourage PMV Decannulation still possible but not  until aspiration concerns addressed and improved from physical rehab stand-point.    Acute blood loss anemia exacerbated acutely again on 6/5 w/ PEG site bleeding felt exacerbated by DOAC -DOAC stopped. Sutured  Plan Trend cbc Holding DOAC->not sure we should ever resume Am cbc Transfuse for hgb < 7  DM2 Plan ssi   Atrial fibrillation Plan Cont rate control Tele Holding DOAC (see above)  Crohn's disease Plan Cont Sulfasalazine per pharmacy    Anxiety, clinical depression and Insomnia Plan Cont paxil and trazodone   Hypothyroidism Plan Cont synthroid    [facility] following   Urinary retention. W/ difficult foley placement.  -last placed 5/16 Plan Cont foley cath  Start urecholine today and order Coude caths for I&O PVRs.   Best practice (right click and "Reselect all SmartList Selections" daily)  Diet:  Tube Feed  Pain/Anxiety/Delirium protocol (if indicated): No VAP protocol (if indicated): Not indicated DVT prophylaxis: SCD GI prophylaxis: H2B Glucose control:  SSI Yes Central venous access:  N/A Arterial line:  N/A Foley:  Yes, and it is still needed Mobility:  bed rest  PT consulted: Yes Last date of multidisciplinary goals of care discussion [5/24, palliative also following] Code Status:  DNR Disposition: remain in ICU   Critical care time: n/a

## 2020-11-13 NOTE — Progress Notes (Signed)
eLink Physician-Brief Progress Note Patient Name: Devin [name] DOB: 1940/12/13 MRN: [id number]   Date of Service  11/13/2020  HPI/Events of Note  Surgeon came at change of shift, had to place additional sutures to peg site, placed g-tube to drainage with TF off, however we have some medications to address for the evening, please review, was thinking protonix IV for substition for pepcid at least.  Pt has oral secretions being contolled with PRN Robinul, could we get a order for scopolamine patch.  Patient asking for secretions to decrease. Trach collar- 5 lit. Data: Follow up Hg is 8.3 , down from 9.6.   eICU Interventions  - follow Hg at 1 AM - Protonix ordered IV instead of pepcid. - can not use robinul. scopolamine patch.      Intervention Category Intermediate Interventions: Other:;Bleeding - evaluation and treatment with blood products  Elmer Sow 11/13/2020, 8:50 PM

## 2020-11-13 NOTE — Progress Notes (Signed)
Patient ID: Devin [name], male   DOB: 1941-01-10, 80 y.o.   MRN: [id number] Bleeding around gastrostomy again.  Awake, alert. I put g tube to drainage and there is no blood via tube. Will hold feeds overnight and leave to drainage. I prepped area and anesthetized with lidocaine. I placed a 2-0 nylon u stitch around the tube and tied it down. This stopped the bleeding. I then placed a total of 5 cc in balloon for now and pulled up to skin after putting surgicel snow around site. Would not anticoagulate.  This is issue at skin it appears related to anticoagulation. Will follow up in am if no issues overnight

## 2020-11-13 NOTE — Progress Notes (Signed)
Physical Therapy Treatment Patient Details Name: Devin [name] MRN: [id number] DOB: 1940-08-26 Today's Date: 11/13/2020    History of Present Illness Pt is a 80 yo male admitted from [facility] 5/24 to ICU after PEG placement and hypotension with need for repeat exploration and evacuation. Pt remained on vent post op. Pt with complex PMHx recently admitted 09/10/20-10/06/20 after fall with T11 chance fx and L1 compression fx s/p PLIF T11-12 on 4/8. Pt with complex respiratory failure requiring trach 4/12. Pt then transferred to CIR 10/06/20; readmitted to [facility] acute care on 10/07/20 for low blood sugars. Other PMH includes HTN, gout, afib, ankylosing spondylitis, osteoporosis, prostate CA, DM2.    PT Comments    Pt admitted with above diagnosis. Pt was able to stand to [facility] with min assist and cues.  Had incontinence of BM today and spent a lot of session taking care of going to bathroom. Pt did report dizziness after 2 stands however VSS. Pt left in chair and did some exercises as well. Pt currently with functional limitations due to balance and endurance deficits. Pt will benefit from skilled PT to increase their independence and safety with mobility to allow discharge to the venue listed below.     Follow Up Recommendations  CIR;Supervision for mobility/OOB     Equipment Recommendations  3in1 (PT);Rolling walker with 5" wheels    Recommendations for Other Services Rehab consult     Precautions / Restrictions Precautions Precautions: Fall;Back Precaution Booklet Issued: No Precaution Comments: Trach via vent, PEG, abdominal incision, TLSO Required Braces or Orthoses: Spinal Brace Spinal Brace: Thoracolumbosacral orthotic;Applied in sitting position;Other (comment) (Did not don TLSO this date 2/2 PEG with bleeding.) Restrictions Weight Bearing Restrictions: No    Mobility  Bed Mobility Overal bed mobility: Needs Assistance Bed Mobility: Rolling;Sidelying to Sit;Sit to Sidelying Rolling:  Min assist Sidelying to sit: Min assist       General bed mobility comments: Requires increased time/effort.    Transfers Overall transfer level: Needs assistance   Transfers: Sit to/from Stand Sit to Stand: Min assist;+2 safety/equipment Stand pivot transfers: +2 safety/equipment;Total assist (used Stedy)       General transfer comment: Min A for sit to stand from EOB with hands on Stedy. Nurse wanted to take Flexi seal out and did so when pt stood.  Pt did have BM after the nurse took it out and transferred him on Stedy to the 3n1 to finish having BM.  Cleaned pt and then moved him in [place] to the recliner.  He stood a total of 2 x.  Pt stood for about 1 min each time.  Pt did c/o dizziness after the 2nd stand when nurse was cleaning pt therefore took pts BP but it was WNL.  Pt was on 28% trach collar and O2 was 90-100% with activity.  HR stable in 80's-90's.  Ambulation/Gait                 Stairs             Wheelchair Mobility    Modified Rankin (Stroke Patients Only)       Balance Overall balance assessment: Needs assistance Sitting-balance support: Feet supported;No upper extremity supported Sitting balance-Leahy Scale: Fair Sitting balance - Comments: Maintains static sitting balance at EOB with supervision A for 8-10 min.   Standing balance support: Bilateral upper extremity supported;During functional activity Standing balance-Leahy Scale: Poor Standing balance comment: Reliant on UE support and external assist  Cognition Arousal/Alertness: Awake/alert Behavior During Therapy: Flat affect Overall Cognitive Status: Difficult to assess Area of Impairment: Following commands                     Memory: Decreased recall of precautions Following Commands: Follows one step commands consistently;Follows multi-step commands with increased time       General Comments: PMV in place. Converses.       Exercises General Exercises - Lower Extremity Ankle Circles/Pumps: AROM;Both;10 reps;Seated Long Arc Quad: AROM;Both;10 reps;Seated Hip Flexion/Marching: AROM;Strengthening;Both;10 reps;Seated    General Comments        Pertinent Vitals/Pain Pain Assessment: No/denies pain    Home Living                      Prior Function            PT Goals (current goals can now be found in the care plan section) Progress towards PT goals: Progressing toward goals    Frequency    Min 3X/week      PT Plan Current plan remains appropriate    Co-evaluation              AM-PAC PT "6 Clicks" Mobility   Outcome Measure  Help needed turning from your back to your side while in a flat bed without using bedrails?: A Little Help needed moving from lying on your back to sitting on the side of a flat bed without using bedrails?: A Little Help needed moving to and from a bed to a chair (including a wheelchair)?: Total Help needed standing up from a chair using your arms (e.g., wheelchair or bedside chair)?: A Little Help needed to walk in hospital room?: Total Help needed climbing 3-5 steps with a railing? : Total 6 Click Score: 12    End of Session Equipment Utilized During Treatment: Other (comment) (pt on trach collar at 28%) Activity Tolerance: Patient tolerated treatment well Patient left: with call bell/phone within reach;in chair;with chair alarm set;with family/visitor present Nurse Communication: Mobility status PT Visit Diagnosis: Difficulty in walking, not elsewhere classified (R26.2);Unsteadiness on feet (R26.81);Other abnormalities of gait and mobility (R26.89);Muscle weakness (generalized) (M62.81) Pain - Right/Left: Right Pain - part of body:  (back)     Time: 2119-4174 PT Time Calculation (min) (ACUTE ONLY): 33 min  Charges:  $Therapeutic Exercise: 8-22 mins $Therapeutic Activity: 8-22 mins                     Destanie Tibbetts M,PT Acute Rehab  Services [phone] (pager)   Alvira Philips 11/13/2020, 2:45 PM

## 2020-11-13 NOTE — Progress Notes (Signed)
No further issues with bleeding s/p prolene sutures 6/5 by Dr. Grandville Silos.  H&H stable as of yesterday evening.  General surgery will sign off, please call as needed.    Obie Dredge, PA-C [facility] Please see Amion for pager number during day hours 7:00am-4:30pm

## 2020-11-13 NOTE — Progress Notes (Signed)
Bleeding noted form surgical G-tube site, new dressing apply to the site. Devin [name] notify for the [name] team to come and assessed the patient G-tube site. Pt is alert and oriented x4, no complaints of pain or discomfort at the site.  MD. Donne Hazel apply stitching to the site, bleeding stop.

## 2020-11-14 LAB — CULTURE, RESPIRATORY W GRAM STAIN: Culture: NORMAL

## 2020-11-14 LAB — GLUCOSE, CAPILLARY
Glucose-Capillary: 104 mg/dL — ABNORMAL HIGH (ref 70–99)
Glucose-Capillary: 115 mg/dL — ABNORMAL HIGH (ref 70–99)
Glucose-Capillary: 133 mg/dL — ABNORMAL HIGH (ref 70–99)
Glucose-Capillary: 134 mg/dL — ABNORMAL HIGH (ref 70–99)
Glucose-Capillary: 173 mg/dL — ABNORMAL HIGH (ref 70–99)
Glucose-Capillary: 178 mg/dL — ABNORMAL HIGH (ref 70–99)
Glucose-Capillary: 201 mg/dL — ABNORMAL HIGH (ref 70–99)

## 2020-11-14 LAB — BASIC METABOLIC PANEL
Anion gap: 7 (ref 5–15)
BUN: 32 mg/dL — ABNORMAL HIGH (ref 8–23)
CO2: 28 mmol/L (ref 22–32)
Calcium: 9.2 mg/dL (ref 8.9–10.3)
Chloride: 94 mmol/L — ABNORMAL LOW (ref 98–111)
Creatinine, Ser: 0.97 mg/dL (ref 0.61–1.24)
GFR, Estimated: >60 mL/min (ref >60)
Glucose, Bld: 100 mg/dL — ABNORMAL HIGH (ref 70–99)
Potassium: 5 mmol/L (ref 3.5–5.1)
Sodium: 129 mmol/L — ABNORMAL LOW (ref 135–145)

## 2020-11-14 LAB — URINE CULTURE
Culture: >=100000 — AB
Special Requests: NORMAL

## 2020-11-14 LAB — TSH: TSH: 27.901 u[IU]/mL — ABNORMAL HIGH (ref 0.350–4.500)

## 2020-11-14 LAB — HEMOGLOBIN AND HEMATOCRIT, BLOOD
HCT: 27.7 % — ABNORMAL LOW (ref 39.0–52.0)
Hemoglobin: 9 g/dL — ABNORMAL LOW (ref 13.0–17.0)

## 2020-11-14 MED ORDER — FUROSEMIDE 10 MG/ML IJ SOLN
40.0000 mg | Freq: Once | INTRAMUSCULAR | Status: AC
  Administered 2020-11-14: 40 mg via INTRAVENOUS
  Filled 2020-11-14: qty 4

## 2020-11-14 MED ORDER — ACETAMINOPHEN 650 MG RE SUPP
650.0000 mg | Freq: Once | RECTAL | Status: AC
  Administered 2020-11-14: 650 mg via RECTAL
  Filled 2020-11-14: qty 1

## 2020-11-14 MED ORDER — CEFAZOLIN SODIUM-DEXTROSE 2-4 GM/100ML-% IV SOLN
2.0000 g | Freq: Three times a day (TID) | INTRAVENOUS | Status: DC
  Administered 2020-11-14 – 2020-11-15 (×4): 2 g via INTRAVENOUS
  Filled 2020-11-14 (×5): qty 100

## 2020-11-14 NOTE — TOC Progression Note (Signed)
Transition of Care [facility]) - Progression Note    Patient Details  Name: FLYNT BREEZE MRN: [id number] Date of Birth: [birth year]  [facility]) CM/SW Contact  Milinda Antis, [place] Phone Number: 11/14/2020, 12:56 PM  Clinical Narrative:    CSW received a consult for possible LTACH placement.  CSW spoke with the patient's daughter, [name], who informed CSW that the family is researching information about LTACH's and will contact the facility when they have made a decision.  CSW informed [name] of the different LTACH agencies to offer choice and was informed that the family would only consider [facility]. .    CSW notified Anderson Malta with [facility] of this information.        Expected Discharge Plan and Services                                                 Social Determinants of Health (SDOH) Interventions    Readmission Risk Interventions No flowsheet data found.

## 2020-11-14 NOTE — Progress Notes (Signed)
Inpatient Rehabilitation Admissions Coordinator  Patient not yet at a level to pursue insurance  Auth for readmit to CIR. I will follow his progress.  Danne Baxter, RN, MSN Rehab Admissions Coordinator [phone] 11/14/2020 12:37 PM

## 2020-11-14 NOTE — Progress Notes (Signed)
[facility] Progress Note  14 Days Post-Op  Subjective: CC:  No complaints. No further bleeding after dr. Donne Hazel placed additional sutures/surgicel last night. TF have been resumed. Pt having bowel function.   Objective: Vital signs in last 24 hours: Temp:  [98 F (36.7 C)-98.9 F (37.2 C)] 98.8 F (37.1 C) (06/07 0725) Pulse Rate:  [63-101] 87 (06/07 0729) Resp:  [16-29] 29 (06/07 0729) BP: (119-151)/(51-97) 146/63 (06/07 0400) SpO2:  [98 %-100 %] 99 % (06/07 0729) FiO2 (%):  [28 %] 28 % (06/07 0729) Weight:  [84.4 kg] 84.4 kg (06/07 0500) Last BM Date: 11/14/20  Intake/Output from previous day: 06/06 0701 - 06/07 0700 In: 739.5 [I.V.:10; NG/GT:450; IV Piggyback:279.5] Out: 2617 [Urine:2615; Stool:2] Intake/Output this shift: No intake/output data recorded.  PE: Gen:  Alert, NAD, pleasant Card:  Regular rate and rhythm, pedal pulses 2+ BL Pulm:  Normal effort, clear to auscultation bilaterally Abd: Soft, non-tender, non-distended, bowel sounds present, G-tube LUQ without active bleeding, TF running. I personally deflated his balloon 2 cc (had been inflated 5 cc overnight to help tamponade bleeding). Skin: warm and dry, no rashes  Psych: A&Ox3   Lab Results:  Recent Labs    11/12/20 1329 11/12/20 1922 11/12/20 2344 11/14/20 0035  WBC 16.3* 14.5*  --   --   HGB 9.5* 9.6* 8.5* 9.0*  HCT 29.9* 30.1* 26.4* 27.7*  PLT 395 396  --   --    BMET Recent Labs    11/12/20 0136 11/14/20 0035  NA 129* 129*  K 4.7 5.0  CL 99 94*  CO2 24 28  GLUCOSE 142* 100*  BUN 36* 32*  CREATININE 1.01 0.97  CALCIUM 9.1 9.2   PT/INR No results for input(s): LABPROT, INR in the last 72 hours. CMP     Component Value Date/Time   NA 129 (L) 11/14/2020 0035   NA 144 02/07/2020 1506   NA 140 02/18/2017 1011   K 5.0 11/14/2020 0035   K 4.5 02/18/2017 1011   CL 94 (L) 11/14/2020 0035   CO2 28 11/14/2020 0035   CO2 22 02/18/2017 1011   GLUCOSE 100 (H) 11/14/2020  0035   GLUCOSE 135 02/18/2017 1011   GLUCOSE 85 03/18/2006 0835   BUN 32 (H) 11/14/2020 0035   BUN 23 02/07/2020 1506   BUN 32.1 (H) 02/18/2017 1011   CREATININE 0.97 11/14/2020 0035   CREATININE 1.39 (H) 09/06/2019 0924   CREATININE 1.9 (H) 02/18/2017 1011   CALCIUM 9.2 11/14/2020 0035   CALCIUM 9.8 02/18/2017 1011   PROT 5.7 (L) 11/12/2020 0136   PROT 6.9 02/18/2017 1011   ALBUMIN 2.4 (L) 11/12/2020 0136   ALBUMIN 3.7 02/18/2017 1011   AST 12 (L) 11/12/2020 0136   AST 25 09/06/2019 0924   AST 35 (H) 02/18/2017 1011   ALT 10 11/12/2020 0136   ALT 20 09/06/2019 0924   ALT 35 02/18/2017 1011   ALKPHOS 76 11/12/2020 0136   ALKPHOS 102 02/18/2017 1011   BILITOT 0.4 11/12/2020 0136   BILITOT 0.4 09/06/2019 0924   BILITOT 0.31 02/18/2017 1011   GFRNONAA >60 11/14/2020 0035   GFRNONAA 49 (L) 09/06/2019 0924   GFRAA >60 02/09/2020 0355   GFRAA 57 (L) 09/06/2019 0924   Lipase     Component Value Date/Time   LIPASE 21 09/10/2020 1031       Studies/Results: No results found.  Anti-infectives: Anti-infectives (From admission, onward)   Start     Dose/Rate Route Frequency Ordered Stop  11/12/20 1400  ceFEPIme (MAXIPIME) 2 g in sodium chloride 0.9 % 100 mL IVPB        2 g 200 mL/hr over 30 Minutes Intravenous Every 8 hours 11/12/20 1306     11/12/20 0430  ceFEPIme (MAXIPIME) 1 g in sodium chloride 0.9 % 100 mL IVPB  Status:  Discontinued        1 g 200 mL/hr over 30 Minutes Intravenous Every 8 hours 11/12/20 0324 11/12/20 1306       Assessment/Plan POD 14 elap/loa/stamm gastrostomy placement followed by reoperation for hemorrhagic shock/hemoperitoneum - Dr. Rolm Bookbinder 10/31/20 - recurrent bleeding around PEG tube requiring bedside sutures/surgicel 6/5, 6/7 - DOAC held again yesterday 6/6, continue to hold anticoagulation - no bleeding on exam today, ok to resume tube feedings - CCS will follow.    LOS: 14 days    Obie Dredge, [facility]  Surgery Please see Amion for pager number during day hours 7:00am-4:30pm

## 2020-11-14 NOTE — Progress Notes (Addendum)
NAME:  Devin [name], MRN:  [id number], DOB:  June 25, 1940, LOS: 64 ADMISSION DATE:  10/31/2020, CONSULTATION DATE:  5/3 REFERRING MD:  Verlon Au, CHIEF COMPLAINT:  Dyspnea   History of Present Illness:  80 y/o had a fall and spinal fracture with post admission course complicated by prolonged mechanical ventilation and 2 extubation failures, ultimately requiring a tracheostomy.  There was concern for supra and sub-glottic airway narrowing.  XL tracheostomy ultimately placed on 5/3.  Moved to Rehab and has been working with them there.  On 5/24 underwent PEG tube placement in the OR with Dr. Donne Hazel complicated by intra-abdominal bleeding requiring emergent re-exploration.   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 Was able to bypass with distal XLT. 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 Voice quality better. Feeling stronger   5/16 foley placed by urology. working with OT, talking   5/19 capping trials with tracheostomy  5/24 PEG placement in OR. PCCM reconsulted on for hypotension. 6.0 XLTD cuffless swithced to 6.0 XLTD cuffed. CVC.  Went to OR for ex lap, washout, ligation of omental artery bleed  5/25 stabilized, start PS trials  5/26 - 5/30 Not tolerating trach collar trials for >1 hour  5/31 A-fib rate of 160 while OOB., tolerating trach  collar  6/1 rate controlled and Eliquis resumed   Tolerating trach collar for short period . 6/4 4 hours trach collar, new infiltrate, WBC up . 6/5 cefepime for HCAP.  bleeding around PEG site. Eliquis stopped again. Had to have surg place suture.  . 6/6 resuming PMV trials. Was off vent all night  . 6/7 removed vent from room. Asking SLP to re-eval for swallowing   Interim History / Subjective:  Feels well  Objective   Blood pressure (Abnormal) 142/60, pulse 87, temperature 98.8 F (37.1 C), temperature source Oral, resp. rate (Abnormal) 29, weight 84.4 kg, SpO2 99 %.    FiO2 (%):  [28 %] 28 %   Intake/Output Summary (Last 24 hours) at 11/14/2020 0841 Last data filed at 11/14/2020 0600 Gross per 24 hour  Intake 684.86 ml  Output 2617 ml  Net -1932.14 ml   Filed Weights   11/12/20 0500 11/13/20 0600 11/14/20 0500  Weight: 84.7 kg 85.8 kg 84.4 kg    Examination: General: 80 year old male patient he sitting up in bed he is in no acute distress this morning HEENT normocephalic atraumatic his distal cuffed tracheostomy is deflated.  His phonation quality continues to improve with PMV in place Pulmonary: Some scattered rhonchi no accessory use excellent cough mechanics Cardiac regular rate and rhythm Abdomen soft no further bleeding from PEG site Continue Foley catheter Neuro intact Extremities warm dry dependent edema   Labs/imaging that I havepersonally reviewed  (right click and "Reselect all SmartList Selections" daily)  See below    Resolved Hospital Problem list   Supraglottic stenosis now resolved Hemorrhagic shock Pyuria and bacturia > asymptomatic, not consistent with UTI by clinical criteria Assessment &  Plan:  Acute on chronic respiratory failure with hypercarbia Tracheal stenosis HCAP  Sputum re incubated for better growth  Plan Day 3 cefepime Cont routine trach care Remove vent from room Will change him to cuffless  Encourage PMV Not a candidate for  decannulation currently but eventually   Acute blood loss anemia exacerbated acutely again on 6/5 w/ PEG site bleeding felt exacerbated by DOAC -DOAC stopped. Sutured  Plan Trend cbc No more doacs Transfuse for hgb < 7  DM2 Plan ssi  Atrial fibrillation Plan Rate control Tele No more systemic AC   Fluid electrolyte imbalance: Hyponatremia, this is been persistent Plan Repeat IV Lasix today  Crohn's disease Plan Cont Sulfalazine per pharmacy   Anxiety, clinical depression and Insomnia Plan Cont paxil and trazodone    Hypothyroidism Plan Cont synthroid  Checking TSH. I wonder if we should be giving this to him IV   Deconditoning Plan PT following   Urinary retention. W/ difficult foley placement.  -last placed 5/16 Plan Remove foley today. Cont Urecholine VT If has retention will need to place Coude cath and keep it till after dc  Best Practice (right click and "Reselect all SmartList Selections" daily)   Diet/type: tubefeeds Pain/Anxiety/Delirium protocol Not indicated VAP protocol (if indicated): Not indicated DVT prophylaxis: SCD GI prophylaxis: PPI Glucose control:  SSI Central venous access:  N/A Arterial line:  N/A Foley:  Yes, and it is no longer needed Mobility:  OOB  PT consulted: Yes Studies pending: None Culture data pending:sputum Last reviewed culture data:today Antibiotics:cefepime Antibiotic de-escalation:to be determined  Stop date: to be determined  Daily labs: not indicated Code Status:  DNR Last date of multidisciplinary goals of care discussion [per palliative] Prognosis: Not life-threatening Disposition:ready for transfer to progressive care     Critical care time: [place] ACNP-BC [place] Pager # [phone] OR # [phone] if no answer

## 2020-11-14 NOTE — Procedures (Signed)
Tracheostomy Exchange Procedure Note  Devin [name]  [id number]  12/12/1940  Date:11/14/20  Time:1:43 PM   Provider Performing:Pete Johnette Abraham Kary Kos   Procedure: Tracheostomy Exchange Through IStoma w/ith bronchoscopic evaluation of subglottic space including insertion point of trachea to carina.  Indication(s) No longer required trach cuff Assessment of subglottic granulation tissue Routine trach exchange time    Consent Risks of the procedure as well as the alternatives and risks of each were explained to the patient and/or caregiver.  Consent for the procedure was obtained and is signed in the bedside chart  Anesthesia None   Time Out Verified patient identification, verified procedure, site/side was marked, verified correct patient position, special equipment/implants available, medications/allergies/relevant history reviewed, required imaging and test results available.   Sterile Technique Hand hygiene, gloves   Procedure Description Size 6 cuffed distal xlt existing Shiley removed over bronchoscope. We visualized the airway from distal tip of trach down to carina and as we pulled trach out visualized the airway from distal tip of trach to the exit of the stoma. The previously noted granulation tissue has resolved. A size  6 DISTAL XLT  uncuffed Shiley placed through stoma.  Complications/Tolerance None; patient tolerated the procedure well..   EBL Minimal   Erick Colace ACNP-BC [place] Pager # [phone] OR # [phone] if no answer

## 2020-11-14 NOTE — Evaluation (Addendum)
Clinical/Bedside Swallow Evaluation Patient Details  Name: Devin [name] MRN: [id number] Date of Birth: [birth year]  Today's Date: 11/14/2020 Time: SLP Start Time (ACUTE ONLY): 1250 SLP Stop Time (ACUTE ONLY): 1340 SLP Time Calculation (min) (ACUTE ONLY): 50 min  Past Medical History:  Past Medical History:  Diagnosis Date  . A-fib ([place])   . Adenocarcinoma of left lung, stage 1 ([place]) 03/10/2015  . Ankylosing spondylitis ([place])    Diagnosed during lumbar fracture summer of 2016    . Crohn's disease ([place])   . Gout   . HIstory of basal cell cancer of face    THYROID CA HX  . History of kidney stones   . Hypertension   . Hypothyroidism   . Impotence   . 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 Obesity (BMI 30-39.9)   . Osteoporosis    Pt completed 5 years of fosamax in 2013     . Prostate cancer with recurrence    Treated with prostatectomy with recurrence 2012 with Lupron treatment now him   . Psoriasis   . Thyroid cancer ([place]) 1999   Treated with RAI and total thyroidectomy   . Type II diabetes mellitus ([place])    Past Surgical History:  Past Surgical History:  Procedure Laterality Date  . ABDOMINAL EXPLORATION SURGERY     for small bowel obstruction  . APPENDECTOMY  03/2007  . BACK SURGERY    . BASAL CELL CARCINOMA EXCISION  "several"   "head"  . CARDIAC CATHETERIZATION  03/17/2003  . CHOLECYSTECTOMY N/A 05/07/2016   Procedure: LAPAROSCOPIC CHOLECYSTECTOMY;  Surgeon: Fanny Skates, MD;  Location: [facility];  Service: General;  Laterality: N/A;  . COLON SURGERY  03/2007   Resection of cecum, appendix, terminal ileum (approximately/notes 10/10/2010  . CYSTOSCOPY/URETEROSCOPY/HOLMIUM LASER/STENT PLACEMENT Right 07/03/2020   Procedure: CYSTOSCOPY/RETROGRADE/URETEROSCOPY REMOVAL OF BLADDER STONE;  Surgeon: Raynelle Bring, MD;  Location: [facility];  Service: Urology;  Laterality: Right;  . DIRECT LARYNGOSCOPY N/A 10/04/2020   Procedure: DIRECT LARYNGOSCOPY,  PHARYNGOSCOPY WITH BIOPSY;  Surgeon: [name], DO;  Location: [facility];  Service: ENT;  Laterality: N/A;  . ESOPHAGOSCOPY N/A 10/04/2020   Procedure: ESOPHAGOSCOPY WITH BIOPSY;  Surgeon: [name], DO;  Location: [facility];  Service: ENT;  Laterality: N/A;  . GASTROSTOMY N/A 10/31/2020   Procedure: INSERTION OF GASTROSTOMY TUBE;  Surgeon: Rolm Bookbinder, MD;  Location: [facility];  Service: General;  Laterality: N/A;  . HERNIA REPAIR    . LAMINECTOMY WITH POSTERIOR LATERAL ARTHRODESIS LEVEL 1 N/A 09/15/2020   Procedure: REVISION OF THORACOLUMBAR FUSION, ADDITION OF CROSS-CONNECTORS;  Surgeon: Eustace Moore, MD;  Location: [facility];  Service: Neurosurgery;  Laterality: N/A;  . LAPAROSCOPIC CHOLECYSTECTOMY  05/07/2016  . LAPAROSCOPIC LYSIS OF ADHESIONS  05/07/2016  . LAPAROSCOPIC LYSIS OF ADHESIONS N/A 05/07/2016   Procedure: LAPAROSCOPIC LYSIS OF ADHESIONS TIMES ONE HOUR;  Surgeon: Fanny Skates, MD;  Location: [facility];  Service: General;  Laterality: N/A;  . LAPAROTOMY N/A 10/31/2020   Procedure: EXPLORATORY LAPAROTOMY WITH Roff;  Surgeon: Rolm Bookbinder, MD;  Location: [facility];  Service: General;  Laterality: N/A;  . POSTERIOR FUSION THORACIC SPINE  02/08/2016   1. Posterior thoracic arthrodesis T7-T11 utilizing morcellized allograft, 2. Posterior thoracic segmental fixation T7-T11 utilizing nuvasive pedicle screws  . PROSTATECTOMY  06/2001   w/bilateral pelvic lymph nose dissection/notes 10/24/2010  . SPINAL FUSION  12/2014   Open reduction internal fixation of L1 [place] fracture with posterior fusion T10-L4 utilizing morcellized  allograft and some local autograft, segmental instrumentation T10-L4 inclusive utilizing nuvasive pedicle screws/notes 12/16/2014  . Stress Cardiolite  02/17/2003  . THOROCOTOMY WITH LOBECTOMY  03/16/2015   Procedure: THOROCOTOMY WITH LOBECTOMY;  Surgeon: Ivin Poot, MD;  Location: [facility];  Service: Thoracic;;  . TONSILLECTOMY    . TOTAL THYROIDECTOMY   1997  . Venous Doppler  05/30/2004  . VENTRAL HERNIA REPAIR  04/14/2008  . VIDEO ASSISTED THORACOSCOPY Left 03/16/2015   Procedure: VIDEO ASSISTED THORACOSCOPY;  Surgeon: Ivin Poot, MD;  Location: [facility];  Service: Thoracic;  Laterality: Left;   HPI:  Pt is a 80 yo male admitted from [place] 5/24 to ICU after PEG placement and hypotension with intraabdominal bleed. Pt remained on vent post op. Pt with complex PMHx- admitted 09/10/20-10/06/20 after fall with T11 fx and L1 compression fx s/p PLIF T11-12 on 4/8. Pt with complex respiratory failure requiring trach 4/12.  Initial MBS 4/22 with severe pharyngeal dysphagia that related primarily to thickening of the prevertebral tissue at the level of C4-5. ENT consulted; supraglottic mass biopsied, negative for malignancy. 5/3 PCCM emergently consulted for inability to suction due to upper airway obstruction. Had significant increase of inflammatory tissue on posterior wall of trachea. Was able to bypass with distal XLT. 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  5/12 repeat MBS on CIR - persisting dysphagia with no functional improvement. 4/19 PEG complicated by intra-abdominal bleeding requiring emergent re-exploration, back on vent. 6/4 tolerating TC; 6/7 trach changed back to cuffless and vent removed from room. Resumed PMV use. Swallow consult 6/7.   Assessment / Plan / Recommendation Clinical Impression  Pt known to SLP services from acute care and CIR admissions.  He has had significant dysphagia with no real demonstrable improvements since initial MBS on 4/22.  Today he is on TC, PMV was in place upon entering room. He was cheerful and voice quality is good; continues with cul-de-sac resonance.  POs while on CIR were limited to ice chips due to poor propulsion of POs through pharynx and impaired airway protection.  Today, clinical presentation remains similar to prior reports - adequate oral control;  consistent throat-clearing/mild cough and multiple sub-swallows with ice chips. Spoke with Marni Griffon - subglottic and pharyngeal wall inflammation appears to be improved.  Pt is ready for a repeat instrumental swallow study in the next 24-48 hours.  Until then, allow occasional ice chips when PMV is in place. D/W pt, who agrees with plan.  SLP will follow. SLP Visit Diagnosis: Dysphagia, oropharyngeal phase (R13.12)    Aspiration Risk  Severe aspiration risk    Diet Recommendation   NPO; PEG TF; allow ice chips  Medication Administration: Via alternative means    Other  Recommendations Oral Care Recommendations: Oral care QID   Follow up Recommendations Inpatient Rehab        Swallow Study   General Date of Onset: 09/07/20 HPI: Pt is a 80 yo male admitted from [place] 5/24 to ICU after PEG placement and hypotension with intraabdominal bleed. Pt remained on vent post op. Pt with complex PMHx- admitted 09/10/20-10/06/20 after fall with T11 fx and L1 compression fx s/p PLIF T11-12 on 4/8. Pt with complex respiratory failure requiring trach 4/12.  Initial MBS 4/22 with severe pharyngeal dysphagia that related primarily to thickening of the prevertebral tissue at the level of C4-5. ENT consulted; supraglottic mass biopsied, negative for malignancy. 5/3 PCCM emergently consulted for inability to suction due to  upper airway obstruction. Had significant increase of inflammatory tissue on posterior wall of trachea. Was able to bypass with distal XLT. 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  5/12 repeat MBS on CIR - persisting dysphagia with no functional improvement. 6/06 PEG complicated by intra-abdominal bleeding requiring emergent re-exploration, back on vent. 6/4 tolerating TC; 6/7 trach changed back to cuffless and vent removed from room. Resumed PMV use. Swallow consult 6/7. Type of Study: Bedside Swallow Evaluation Previous Swallow Assessment: see  HPI Diet Prior to this Study: NPO Temperature Spikes Noted: No Respiratory Status: Trach;Trach Collar Trach Size and Type: #6;With PMSV in place;Extra long;Uncuffed History of Recent Intubation: Yes Length of Intubations (days):  (via traxch) Behavior/Cognition: Alert;Cooperative;Pleasant mood Oral Cavity Assessment: Within Functional Limits Oral Care Completed by SLP: Recent completion by staff Oral Cavity - Dentition: Missing dentition Vision: Functional for self-feeding Self-Feeding Abilities: Needs assist Patient Positioning: Upright in bed Baseline Vocal Quality: Other (comment) (cul de sac resonance) Volitional Cough: Strong Volitional Swallow: Able to elicit    Oral/Motor/Sensory Function Overall Oral Motor/Sensory Function: Within functional limits   Ice Chips Ice chips: Impaired Presentation: Spoon Pharyngeal Phase Impairments: Cough - Delayed;Throat Clearing - Immediate;Multiple swallows   Thin Liquid Thin Liquid: Not tested    Nectar Thick Nectar Thick Liquid: Not tested   Honey Thick Honey Thick Liquid: Not tested   Puree Puree: Not tested   Solid     Solid: Not tested      [name] 11/14/2020,1:51 PM   Estill Bamberg L. Tivis Ringer, [place] Office number [phone] Pager [phone]

## 2020-11-14 NOTE — Progress Notes (Signed)
Occupational Therapy Treatment Patient Details Name: Devin [name] MRN: [id number] DOB: Jan 13, 1941 Today's Date: 11/14/2020    History of present illness Pt is a 80 yo male admitted from [place] 5/24 to ICU after PEG placement and hypotension with need for repeat exploration and evacuation. Pt remained on vent post op. Pt with complex PMHx recently admitted 09/10/20-10/06/20 after fall with T11 chance fx and L1 compression fx s/p PLIF T11-12 on 4/8. Pt with complex respiratory failure requiring trach 4/12. Pt then transferred to CIR 10/06/20; readmitted to [facility] acute care on 10/07/20 for low blood sugars. Other PMH includes HTN, gout, afib, ankylosing spondylitis, osteoporosis, prostate CA, DM2.   OT comments  Patient limited by fatigue this date. Reports inability to sleep last night. Patient progressed to EOB but reported dizziness with sitting. BP 151/86 in supine and 141/65 seated EOB. Patient unable to tolerate seated grooming tasks at EOB this date with request for return to supine. Patient in agreement with bed level therapeutic exercise completing 1 set x10 reps each of HEP. OT to continue to follow acutely.    Follow Up Recommendations  CIR    Equipment Recommendations  Other (comment) (Defer to next level of care.)    Recommendations for Other Services Rehab consult    Precautions / Restrictions Precautions Precautions: Fall;Back Precaution Booklet Issued: No Precaution Comments: Trach via vent, PEG, abdominal incision, TLSO Required Braces or Orthoses: Spinal Brace Spinal Brace: Thoracolumbosacral orthotic;Applied in sitting position;Other (comment) (Did not don TLSO this date 2/2 PEG.) Restrictions Weight Bearing Restrictions: No       Mobility Bed Mobility Overal bed mobility: Needs Assistance Bed Mobility: Rolling;Sidelying to Sit;Sit to Sidelying Rolling: Min assist Sidelying to sit: Min assist       General bed mobility comments: Requires increased time/effort.     Transfers Overall transfer level: Needs assistance               General transfer comment: Patient deferred 2/2 fatigue    Balance Overall balance assessment: Needs assistance Sitting-balance support: Feet supported;No upper extremity supported Sitting balance-Leahy Scale: Fair Sitting balance - Comments: Maintains static sitting balance at EOB with supervision A for 5-8 min.                                   ADL either performed or assessed with clinical judgement   ADL                                         General ADL Comments: Making progress toward goals.     Vision       Perception     Praxis      Cognition Arousal/Alertness: Awake/alert Behavior During Therapy: Flat affect Overall Cognitive Status: Difficult to assess Area of Impairment: Following commands                     Memory: Decreased recall of precautions Following Commands: Follows one step commands consistently;Follows multi-step commands with increased time       General Comments: PMV in place.        Exercises General Exercises - Lower Extremity Long Arc Quad: AROM;Both;10 reps;Seated Other Exercises Other Exercises: Straight leg raises 1 set x 10 reps each.   Shoulder Instructions       General Comments Reports feeling "  swimmy headed" upon sitting at EOB. Vitals monitored with diastolic BP soft. Patient with request to return to supine.    Pertinent Vitals/ Pain       Pain Assessment: No/denies pain  Home Living                                          Prior Functioning/Environment              Frequency  Min 2X/week        Progress Toward Goals  OT Goals(current goals can now be found in the care plan section)  Progress towards OT goals: Progressing toward goals (Slowly)  Acute Rehab OT Goals Patient Stated Goal: In agreement with treatment session. OT Goal Formulation: With patient/family Time  For Goal Achievement: 12/01/20 Potential to Achieve Goals: Good ADL Goals Pt Will Perform Grooming: with set-up;sitting Pt Will Perform Lower Body Dressing: with min assist;sit to/from stand Pt Will Transfer to Toilet: with mod assist;bedside commode Pt Will Perform Toileting - Clothing Manipulation and hygiene: with mod assist;sit to/from stand Pt/caregiver will Perform Home Exercise Program: Both right and left upper extremity;With written HEP provided;Increased strength;Increased ROM Additional ADL Goal #1: Patient will tolerate EOB activity for 15 minutes in prep for ADLs. Additional ADL Goal #2: Patient will complete 3/3 grooming tasks seated EOB with set-up assist.  Plan Frequency remains appropriate;Discharge plan remains appropriate    Co-evaluation                 AM-PAC OT "6 Clicks" Daily Activity     Outcome Measure   Help from another person eating meals?: Total Help from another person taking care of personal grooming?: A Little Help from another person toileting, which includes using toliet, bedpan, or urinal?: Total Help from another person bathing (including washing, rinsing, drying)?: Total Help from another person to put on and taking off regular upper body clothing?: A Lot Help from another person to put on and taking off regular lower body clothing?: A Lot 6 Click Score: 10    End of Session Equipment Utilized During Treatment: Oxygen (5L via trach collar)  OT Visit Diagnosis: Unsteadiness on feet (R26.81);Muscle weakness (generalized) (M62.81)   Activity Tolerance Patient tolerated treatment well   Patient Left with call bell/phone within reach;in bed;with bed alarm set   Nurse Communication Mobility status        Time: 4696-2952 OT Time Calculation (min): 21 min  Charges: OT General Charges $OT Visit: 1 Visit OT Treatments $Self Care/Home Management : 8-22 mins  Khamron Gellert H. OTR/L Supplemental OT, Department of rehab services  [phone]   Nidal Rivet R H. 11/14/2020, 11:59 AM

## 2020-11-14 NOTE — Progress Notes (Signed)
eLink Physician-Brief Progress Note Patient Name: Devin [name] DOB: [birth year] MRN: [id number]   Date of Service  11/14/2020  HPI/Events of Note  Pt asking for something for pain around sutured area to Peg Site.  Will need something IV due to G-Tube to drainage  eICU Interventions  PR tyelnol ordered, discussed with RN.      Intervention Category Intermediate Interventions: Pain - evaluation and management  Elmer Sow 11/14/2020, 6:33 AM

## 2020-11-15 ENCOUNTER — Inpatient Hospital Stay (HOSPITAL_COMMUNITY): Payer: Medicare Other

## 2020-11-15 DIAGNOSIS — N183 Chronic kidney disease, stage 3 unspecified: Secondary | ICD-10-CM

## 2020-11-15 DIAGNOSIS — C3492 Malignant neoplasm of unspecified part of left bronchus or lung: Secondary | ICD-10-CM

## 2020-11-15 DIAGNOSIS — E1122 Type 2 diabetes mellitus with diabetic chronic kidney disease: Secondary | ICD-10-CM

## 2020-11-15 DIAGNOSIS — Z794 Long term (current) use of insulin: Secondary | ICD-10-CM

## 2020-11-15 DIAGNOSIS — Z902 Acquired absence of lung [part of]: Secondary | ICD-10-CM

## 2020-11-15 DIAGNOSIS — T8119XA Other postprocedural shock, initial encounter: Secondary | ICD-10-CM

## 2020-11-15 DIAGNOSIS — K5090 Crohn's disease, unspecified, without complications: Secondary | ICD-10-CM

## 2020-11-15 DIAGNOSIS — E890 Postprocedural hypothyroidism: Secondary | ICD-10-CM

## 2020-11-15 DIAGNOSIS — I10 Essential (primary) hypertension: Secondary | ICD-10-CM

## 2020-11-15 DIAGNOSIS — Z930 Tracheostomy status: Secondary | ICD-10-CM

## 2020-11-15 LAB — CBC
HCT: 26.3 % — ABNORMAL LOW (ref 39.0–52.0)
Hemoglobin: 8.5 g/dL — ABNORMAL LOW (ref 13.0–17.0)
MCH: 30.8 pg (ref 26.0–34.0)
MCHC: 32.3 g/dL (ref 30.0–36.0)
MCV: 95.3 fL (ref 80.0–100.0)
Platelets: 362 10*3/uL (ref 150–400)
RBC: 2.76 MIL/uL — ABNORMAL LOW (ref 4.22–5.81)
RDW: 14.6 % (ref 11.5–15.5)
WBC: 13 10*3/uL — ABNORMAL HIGH (ref 4.0–10.5)
nRBC: 0 % (ref 0.0–0.2)

## 2020-11-15 LAB — GLUCOSE, CAPILLARY
Glucose-Capillary: 169 mg/dL — ABNORMAL HIGH (ref 70–99)
Glucose-Capillary: 160 mg/dL — ABNORMAL HIGH (ref 70–99)
Glucose-Capillary: 151 mg/dL — ABNORMAL HIGH (ref 70–99)
Glucose-Capillary: 151 mg/dL — ABNORMAL HIGH (ref 70–99)
Glucose-Capillary: 179 mg/dL — ABNORMAL HIGH (ref 70–99)
Glucose-Capillary: 183 mg/dL — ABNORMAL HIGH (ref 70–99)

## 2020-11-15 MED ORDER — CEPHALEXIN 250 MG/5ML PO SUSR
500.0000 mg | Freq: Four times a day (QID) | ORAL | Status: AC
  Administered 2020-11-15 – 2020-11-16 (×4): 500 mg
  Filled 2020-11-15 (×5): qty 10

## 2020-11-15 MED ORDER — TAMSULOSIN HCL 0.4 MG PO CAPS
0.4000 mg | ORAL_CAPSULE | Freq: Every day | ORAL | Status: DC
  Administered 2020-11-15 – 2020-11-20 (×6): 0.4 mg via ORAL
  Filled 2020-11-15 (×6): qty 1

## 2020-11-15 MED ORDER — LIDOCAINE HCL URETHRAL/MUCOSAL 2 % EX GEL
1.0000 "application " | Freq: Once | CUTANEOUS | Status: AC
  Administered 2020-11-15: 1 via URETHRAL
  Filled 2020-11-15: qty 11

## 2020-11-15 MED ORDER — INSULIN ASPART 100 UNIT/ML IJ SOLN
0.0000 [IU] | INTRAMUSCULAR | Status: DC
  Administered 2020-11-15 – 2020-11-16 (×5): 2 [IU] via SUBCUTANEOUS
  Administered 2020-11-16: 3 [IU] via SUBCUTANEOUS
  Administered 2020-11-16 (×2): 1 [IU] via SUBCUTANEOUS
  Administered 2020-11-17: 2 [IU] via SUBCUTANEOUS
  Administered 2020-11-17: 1 [IU] via SUBCUTANEOUS
  Administered 2020-11-17: 2 [IU] via SUBCUTANEOUS
  Administered 2020-11-17 (×2): 1 [IU] via SUBCUTANEOUS
  Administered 2020-11-17 – 2020-11-18 (×2): 2 [IU] via SUBCUTANEOUS
  Administered 2020-11-18 – 2020-11-19 (×5): 1 [IU] via SUBCUTANEOUS
  Administered 2020-11-19: 2 [IU] via SUBCUTANEOUS
  Administered 2020-11-19 – 2020-11-20 (×3): 1 [IU] via SUBCUTANEOUS
  Administered 2020-11-20: 2 [IU] via SUBCUTANEOUS
  Administered 2020-11-20 (×2): 1 [IU] via SUBCUTANEOUS

## 2020-11-15 MED ORDER — INSULIN GLARGINE 100 UNIT/ML ~~LOC~~ SOLN
12.0000 [IU] | Freq: Every day | SUBCUTANEOUS | Status: DC
  Administered 2020-11-15 – 2020-11-19 (×5): 12 [IU] via SUBCUTANEOUS
  Filled 2020-11-15 (×8): qty 0.12

## 2020-11-15 NOTE — Progress Notes (Addendum)
Modified Barium Swallow Progress Note  Patient Details  Name: Devin [name] MRN: [id number] Date of Birth: [birth year]  Today's Date: 11/15/2020  Modified Barium Swallow completed.  Full report located under Chart Review in the Imaging Section.  Brief recommendations include the following:  Clinical Impression  Pt was seen in radiology suite for modified barium swallow study. PMSV was in place throughout the study. Thickening of prevertebral tissue at the level of C4-5 continues to be noted and still impede complete epiglottic inversion, but this was slightly improved compared to the initial study on 09/29/20. Anterior laryngeal movement and pharyngeal constriction were reduced. Vallecular and posterior pharyngeal wall residue were noted. The prevertebral thickening facilitated penetration (PAS 3) of liquids secondary to spillage of posterior pharyngeal wall residue over the arytenoids and into the larynx and to residue which accumulated on the laryngeal surface of the epiglottis during incomplete epiglottic inversion. A right head turn posture resulted in aspiration (PAS 7) of nectar thick liquids via tsp. A chin tuck posture with left head turn and effortful swallow improved epiglottic inversion and reduced frequency of penetration with honey and nectar thick liquids. No laryngeal invasion was noted with 1/2 tsp boluses of puree. Pt's independent use of an intermittent cough and secondary swallows was effective in clearing penetration (PAS 3), but coughing was ineffective for episodes of aspiration. Coughing was noted both with pharyngeal residue and during moments of laryngeal invasion. Use of a combination of these multiple strategies shows therapeutic promise, but SLP anticipates that these will be laborious for the pt. Pt's NPO status will be continued with allowance of occasional ice chips following oral care. SLP will follow for continued treatment and is hopeful that p.o. intake (likely snacks  of modified consistencies initially) can be initiated with training on compensatory strategies.   Swallow Evaluation Recommendations       SLP Diet Recommendations: NPO;Alternative means - long-term       Medication Administration: Via alternative means               Oral Care Recommendations: Oral care QID      Gaby Harney I. Hardin Negus, [place], [place] Office number [phone] Pager [place] 11/15/2020,3:42 PM

## 2020-11-15 NOTE — Progress Notes (Signed)
Inpatient Rehabilitation Admissions Coordinator  Contacted by [name], daughter to discuss [facility] referral. I concur that LTACH would be a good option to give him extended recovery time and then we will see if he would be appropriate for CIR vs home with [facility] at that time. I will follow at a distance at this time.  Danne Baxter, RN, MSN Rehab Admissions Coordinator [phone] 11/15/2020 11:36 AM

## 2020-11-15 NOTE — Progress Notes (Signed)
PROGRESS NOTE    KOUROSH JABLONSKY   ATF:[id number]  DOB: 1940/08/31  DOA: 10/31/2020 PCP: Prince Solian, MD   Brief Narrative:  Devin [name] is a 80 year old male with diabetes mellitus, lung adenocarcinoma status post left upper lobectomy, Crohn's disease, thyroid cancer, prostate cancer, atrial fibrillation rater than 4, ankylosing spondylitis who is status post T11-12 fusion after a chance fracture.  He was hospitalized from 4/3 through 4/29.  Hospital course was complicated by sepsis secondary to Klebsiella pneumonia which was treated with 5 days of Zosyn.  He was discharged to CIR with a trach (for supraglottic and subglottic stenosis and tracheomalacia) and a core track. Patient was readmitted to the hospital on 4/30 after it was noted in CIR that the core track dislodged and was unable to be reinserted.   On 5/3 his trach was changed.  Subsequently, he was unable to be suctioned via this trach.  PCCM was consulted and it was discovered that he had significant inflammatory tissue in the posterior aspect of the trachea.  PCCM was able to pass an XLT. On 5/5, he was discharged back to [facility] inpatient rehab.  On 5/24, a PEG tube was placed by general surgery and patient was hypotensive postop.  He was started on phenylephrine and admitted to the ICU.  He was found to be acutely anemic with a drop in hemoglobin from 12.9-8.8.  2 units of packed red blood cells were ordered he was taken back to the OR.  An ex lap with ligation of omentum and washout of hemoperitoneum was performed.   On 6/6 he was noted to have bleeding around the gastrostomy again and tube feeds were discontinued.  The tube was sutured once again.  General surgery recommended against anticoagulation in the future due to ongoing risk for bleeding from the site.  Needs were resumed. On 6/8 the patient was transferred to the [facility]ad hospitalist service.  Subjective: Sitting up in a chair.  He states he feels "much better".   He has a mild cough which is productive of white sputum and mild to moderate abdominal pain around the PEG tube.  Foley has been removed and he states that he is voiding well.  Tube feeds are running..    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 at this time    Postsurgical hypothyroidism - TSH 27.901< 17.9< 11< 9 -he has been on Synthroid 125 mcg in the hospital and while at CIR - will increase Synthroid to 150   Atrial fibrillation CHA2DS2-VASc Score 5 - holding Eliquis due to bleeding from PEG - cont Diltiazem 30 Q 6 hrs   UTI - He was on Cefepime and this was changed to Ancef yesterday to treat a UTI after the sputum culture resulted with normal flora- He had a foley and this was removed- he is voiding well- will continue to treat for UTI with 5 days total - switch Ancef to Keflex    DM (diabetes mellitus), type 2 with renal complications  -   Lantus on hold- on Novolog SS - will change to Lantus 12 U and low dose Novolog scale   Essential hypertension - Atenolol on hold    Crohn's disease  - cont Sulfasalazine    Adenocarcinoma of left lung, stage 1 S/P lobectomy of lung    Thoracic compression fracture-h/o  Ankylosing spondylitis   -  s/p fusion    Malnutrition of moderate degree - cont tube feeds    Prostate cancer with recurrence  BPH - cont Flomax  Time spent in minutes: 35 DVT prophylaxis: Place and maintain sequential compression device Start: 11/13/20 0811 SCDs Start: 10/31/20 1451 Code Status: DNR Family Communication: daughter at bedside Level of Care: Level of care: ICU Disposition Plan:  Status is: Inpatient  Remains inpatient appropriate because:Inpatient level of care appropriate due to severity of illness   Dispo: The patient is from: SNF              Anticipated d/c is to: LTAC               Patient currently is not medically stable to d/c.   Difficult to place patient No   Consultants:   PCCM  General surgery  Palliative care Procedures:   . PEG tube placement  Central line  Trach changes Antimicrobials:  Anti-infectives (From admission, onward)   Start     Dose/Rate Route Frequency Ordered Stop   11/14/20 1400  ceFAZolin (ANCEF) IVPB 2g/100 mL premix        2 g 200 mL/hr over 30 Minutes Intravenous Every 8 hours 11/14/20 1049 11/17/20 0559   11/12/20 1400  ceFEPIme (MAXIPIME) 2 g in sodium chloride 0.9 % 100 mL IVPB  Status:  Discontinued        2 g 200 mL/hr over 30 Minutes Intravenous Every 8 hours 11/12/20 1306 11/14/20 1049   11/12/20 0430  ceFEPIme (MAXIPIME) 1 g in sodium chloride 0.9 % 100 mL IVPB  Status:  Discontinued        1 g 200 mL/hr over 30 Minutes Intravenous Every 8 hours 11/12/20 0324 11/12/20 1306       Objective: Vitals:   11/15/20 1000 11/15/20 1100 11/15/20 1133 11/15/20 1200  BP:  125/61  (!) 124/53  Pulse: 90 81  77  Resp:    (!) 25  Temp:   97.6 F (36.4 C)   TempSrc:   Oral   SpO2:    100%  Weight:        Intake/Output Summary (Last 24 hours) at 11/15/2020 1417 Last data filed at 11/15/2020 1200 Gross per 24 hour  Intake 1549.86 ml  Output 1970 ml  Net -420.14 ml   Filed Weights   11/13/20 0600 11/14/20 0500 11/15/20 0500  Weight: 85.8 kg 84.4 kg 84.4 kg    Examination: General exam: Appears comfortable  HEENT: PERRLA, oral mucosa moist, no sclera icterus or thrush- trach collar in place Respiratory system: Clear to auscultation. Respiratory effort normal. Cardiovascular system: S1 & S2 heard, RRR.   Gastrointestinal system: Abdomen soft, non-tender, nondistended. Normal bowel sounds. PEG tube in place Central nervous system: Alert and oriented. No focal neurological deficits. Extremities: No cyanosis, clubbing or edema Skin: No rashes or ulcers Psychiatry:  Mood & affect appropriate.     Data Reviewed: I  have personally reviewed following labs and imaging studies  CBC: Recent Labs  Lab 11/10/20 0121 11/11/20 0256 11/12/20 1329 11/12/20 1922 11/12/20 2344 11/14/20 0035 11/15/20 0207  WBC 9.1 12.6* 16.3* 14.5*  --   --  13.0*  NEUTROABS  --  10.4*  --   --   --   --   --   HGB 8.4* 8.1* 9.5* 9.6* 8.5* 9.0* 8.5*  HCT 25.4* 25.0* 29.9* 30.1* 26.4* 27.7* 26.3*  MCV 95.5 95.8 97.4 97.4  --   --  95.3  PLT 343  338 395 396  --   --  401   Basic Metabolic Panel: Recent Labs  Lab 11/09/20 1037 11/10/20 0121 11/11/20 0256 11/12/20 0136 11/14/20 0035  NA 131* 129*  --  129* 129*  K 3.8 4.9  --  4.7 5.0  CL 100 102  --  99 94*  CO2 26 21*  --  24 28  GLUCOSE 167* 155*  --  142* 100*  BUN 30* 34*  --  36* 32*  CREATININE 1.02 1.05  --  1.01 0.97  CALCIUM 8.6* 8.7*  --  9.1 9.2  MG 1.8 1.9 1.8  --   --   PHOS  --   --  3.2  --   --    GFR: Estimated Creatinine Clearance: 67.9 mL/min (by C-G formula based on SCr of 0.97 mg/dL). Liver Function Tests: Recent Labs  Lab 11/12/20 0136  AST 12*  ALT 10  ALKPHOS 76  BILITOT 0.4  PROT 5.7*  ALBUMIN 2.4*   No results for input(s): LIPASE, AMYLASE in the last 168 hours. No results for input(s): AMMONIA in the last 168 hours. Coagulation Profile: No results for input(s): INR, PROTIME in the last 168 hours. Cardiac Enzymes: No results for input(s): CKTOTAL, CKMB, CKMBINDEX, TROPONINI in the last 168 hours. BNP (last 3 results) No results for input(s): PROBNP in the last 8760 hours. HbA1C: No results for input(s): HGBA1C in the last 72 hours. CBG: Recent Labs  Lab 11/14/20 1919 11/14/20 2316 11/15/20 0321 11/15/20 0717 11/15/20 1109  GLUCAP 201* 173* 169* 160* 151*   Lipid Profile: No results for input(s): CHOL, HDL, LDLCALC, TRIG, CHOLHDL, LDLDIRECT in the last 72 hours. Thyroid Function Tests: Recent Labs    11/14/20 0950  TSH 27.901*   Anemia Panel: No results for input(s): VITAMINB12, FOLATE, FERRITIN, TIBC,  IRON, RETICCTPCT in the last 72 hours. Urine analysis:    Component Value Date/Time   COLORURINE YELLOW (A) 11/11/2020 2202   APPEARANCEUR CLOUDY (A) 11/11/2020 2202   LABSPEC RESULTS UNAVAILABLE DUE TO INTERFERING SUBSTANCE 11/11/2020 2202   PHURINE RESULTS UNAVAILABLE DUE TO INTERFERING SUBSTANCE 11/11/2020 2202   GLUCOSEU RESULTS UNAVAILABLE DUE TO INTERFERING SUBSTANCE (A) 11/11/2020 2202   GLUCOSEU NEGATIVE 03/05/2016 1203   HGBUR RESULTS UNAVAILABLE DUE TO INTERFERING SUBSTANCE (A) 11/11/2020 2202   BILIRUBINUR RESULTS UNAVAILABLE DUE TO INTERFERING SUBSTANCE (A) 11/11/2020 2202   KETONESUR RESULTS UNAVAILABLE DUE TO INTERFERING SUBSTANCE (A) 11/11/2020 2202   PROTEINUR RESULTS UNAVAILABLE DUE TO INTERFERING SUBSTANCE (A) 11/11/2020 2202   UROBILINOGEN 0.2 03/05/2016 1203   NITRITE RESULTS UNAVAILABLE DUE TO INTERFERING SUBSTANCE (A) 11/11/2020 2202   LEUKOCYTESUR RESULTS UNAVAILABLE DUE TO INTERFERING SUBSTANCE (A) 11/11/2020 2202   Sepsis Labs: @LABRCNTIP (procalcitonin:4,lacticidven:4) ) Recent Results (from the past 240 hour(s))  Culture, Urine     Status: Abnormal   Collection Time: 11/12/20  3:10 AM   Specimen: Urine, Catheterized  Result Value Ref Range Status   Specimen Description URINE, CATHETERIZED  Final   Special Requests   Final    Normal Performed at [facility], [place] [address], [place], [place] [address]    Culture >=100,000 COLONIES/mL PROTEUS MIRABILIS (A)  Final   Report Status 11/14/2020 FINAL  Final   Organism ID, Bacteria PROTEUS MIRABILIS (A)  Final      Susceptibility   Proteus mirabilis - MIC*    AMPICILLIN <=2 SENSITIVE Sensitive     CEFAZOLIN 8 SENSITIVE Sensitive     CEFEPIME <=0.12 SENSITIVE Sensitive  CEFTRIAXONE <=0.25 SENSITIVE Sensitive     CIPROFLOXACIN <=0.25 SENSITIVE Sensitive     GENTAMICIN 4 SENSITIVE Sensitive     IMIPENEM 8 INTERMEDIATE Intermediate     NITROFURANTOIN 128 RESISTANT Resistant     TRIMETH/SULFA <=20  SENSITIVE Sensitive     AMPICILLIN/SULBACTAM <=2 SENSITIVE Sensitive     PIP/TAZO <=4 SENSITIVE Sensitive     * >=100,000 COLONIES/mL PROTEUS MIRABILIS  Culture, Respiratory w Gram Stain     Status: None   Collection Time: 11/12/20  2:58 PM   Specimen: Tracheal Aspirate; Respiratory  Result Value Ref Range Status   Specimen Description TRACHEAL ASPIRATE  Final   Special Requests NONE  Final   Gram Stain   Final    MODERATE WBC PRESENT, PREDOMINANTLY PMN RARE GRAM POSITIVE COCCI RARE GRAM VARIABLE ROD    Culture   Final    RARE Normal respiratory flora-no Staph aureus or Pseudomonas seen Performed at [facility], [address], [place],  [address]    Report Status 11/14/2020 FINAL  Final         Radiology Studies: No results found.    Scheduled Meds: . sodium chloride   Intravenous Once  . arformoterol  15 mcg Nebulization BID  . bethanechol  10 mg Per Tube QID  . budesonide  0.5 mg Nebulization BID  . chlorhexidine gluconate (MEDLINE KIT)  15 mL Mouth Rinse BID  . Chlorhexidine Gluconate Cloth  6 each Topical Daily  . cholestyramine  4 g Per Tube BID  . diltiazem  30 mg Per Tube Q6H  . famotidine  20 mg Per Tube BID  . Gerhardt's butt cream   Topical BID  . insulin aspart  0-20 Units Subcutaneous Q4H  . levothyroxine  125 mcg Per Tube Q0600  . mouth rinse  15 mL Mouth Rinse Q2H  . multivitamin with minerals  1 tablet Per Tube Daily  . pantoprazole (PROTONIX) IV  40 mg Intravenous Q24H  . PARoxetine  10 mg Per Tube Daily  . scopolamine  1 patch Transdermal Q72H  . sodium chloride flush  10-40 mL Intracatheter Q12H  . sulfaSALAzine  500 mg Per Tube Daily  . traZODone  50 mg Per Tube QHS   Continuous Infusions: . sodium chloride 250 mL (11/03/20 0324)  .  ceFAZolin (ANCEF) IV 2 g (11/15/20 0601)  . feeding supplement (VITAL AF 1.2 CAL) 1,000 mL (11/14/20 2239)     LOS: 15 days      Debbe Odea, MD [facility] Pager: [email or web address] 11/15/2020, 2:17 PM

## 2020-11-15 NOTE — Progress Notes (Signed)
Physical Therapy Treatment Patient Details Name: Devin [name] MRN: [id number] DOB: [birth year] Today's Date: 11/15/2020    History of Present Illness Pt is a 80 yo male admitted from [place] 5/24 to ICU after PEG placement and hypotension with need for repeat exploration and evacuation. Pt remained on vent post op. Pt with complex PMHx recently admitted 09/10/20-10/06/20 after fall with T11 chance fx and L1 compression fx s/p PLIF T11-12 on 4/8. Pt with complex respiratory failure requiring trach 4/12. Pt then transferred to CIR 10/06/20; readmitted to [facility] acute care on 10/07/20 for low blood sugars. Other PMH includes HTN, gout, afib, ankylosing spondylitis, osteoporosis, prostate CA, DM2.    PT Comments    Pt admitted with above diagnosis. Pt met 4/5 goals.  Goals revised today.  Pt able to use RW for transfers and short distance ambulation today. Continue to be limited by pt having to get on toilet at the beginning of each sesson and then being too fatigued to walk further.  Will continue to progress pt as able.  Pt currently with functional limitations due to balance and endurance deficits. Pt will benefit from skilled PT to increase their independence and safety with mobility to allow discharge to the venue listed below.     Follow Up Recommendations  CIR;Supervision for mobility/OOB     Equipment Recommendations  3in1 (PT);Rolling walker with 5" wheels    Recommendations for Other Services Rehab consult     Precautions / Restrictions Precautions Precautions: Fall;Back Precaution Booklet Issued: No Precaution Comments: Trach collar, PEG, abdominal incision, TLSO Required Braces or Orthoses: Spinal Brace Spinal Brace: Thoracolumbosacral orthotic;Applied in sitting position;Other (comment) (Did not don TLSO this date 2/2 PEG.) Restrictions Weight Bearing Restrictions: No    Mobility  Bed Mobility Overal bed mobility: Needs Assistance Bed Mobility: Rolling;Sidelying to Sit;Sit to  Sidelying Rolling: Min assist Sidelying to sit: Min assist       General bed mobility comments: Requires increased time/effort.    Transfers Overall transfer level: Needs assistance Equipment used: Rolling walker (2 wheeled) Transfers: Sit to/from Stand Sit to Stand: Min assist Stand pivot transfers: Min guard;Min assist       General transfer comment: Pt able to stand to RW and pivot to 3N1 with cues and guard assist after min assist for power up. Had BM and then stood to be cleaned and then took a few steps to recliner wtih min guard assist for balance and assist and cues to steer RW and sequence steps.  Ambulation/Gait Ambulation/Gait assistance: Min assist;Min guard Gait Distance (Feet): 4 Feet Assistive device: Rolling walker (2 wheeled) Gait Pattern/deviations: Step-to pattern;Trunk flexed Gait velocity: Decreased   General Gait Details: Short ambulation distance from 3N1 to recliner with RW, min to min guard  for stability.  Pt progressing with ambulation and was not dizzy today.  Did not want to walk further as he was tired from having BM sitting on commode and wanted to just get to chair.   Stairs             Wheelchair Mobility    Modified Rankin (Stroke Patients Only)       Balance Overall balance assessment: Needs assistance Sitting-balance support: Feet supported;No upper extremity supported Sitting balance-Leahy Scale: Fair Sitting balance - Comments: Maintains static sitting balance at EOB with supervision A for 5-8 min.   Standing balance support: Bilateral upper extremity supported;During functional activity Standing balance-Leahy Scale: Poor Standing balance comment: Reliant on UE support and external assist  Cognition Arousal/Alertness: Awake/alert Behavior During Therapy: Flat affect Overall Cognitive Status: Difficult to assess Area of Impairment: Following commands                      Memory: Decreased recall of precautions Following Commands: Follows one step commands consistently;Follows multi-step commands with increased time       General Comments: PMV in place.      Exercises General Exercises - Upper Extremity Shoulder Flexion: AROM;Both;10 reps Elbow Flexion: AROM;Both;10 reps Elbow Extension: AROM;Both;10 reps General Exercises - Lower Extremity Ankle Circles/Pumps: AROM;Both;10 reps;Seated Long Arc Quad: AROM;Both;10 reps;Seated Hip Flexion/Marching: AROM;Strengthening;Both;10 reps;Seated    General Comments General comments (skin integrity, edema, etc.): BP 127/50 on arrival and 131/52 at end of session.  Sats on 28% trach collar >90%.      Pertinent Vitals/Pain Pain Assessment: No/denies pain    Home Living                      Prior Function            PT Goals (current goals can now be found in the care plan section) Acute Rehab PT Goals PT Goal Formulation: With patient Time For Goal Achievement: 11/29/20 Potential to Achieve Goals: Fair Progress towards PT goals: Progressing toward goals    Frequency    Min 3X/week      PT Plan Current plan remains appropriate    Co-evaluation              AM-PAC PT "6 Clicks" Mobility   Outcome Measure  Help needed turning from your back to your side while in a flat bed without using bedrails?: A Little Help needed moving from lying on your back to sitting on the side of a flat bed without using bedrails?: A Little Help needed moving to and from a bed to a chair (including a wheelchair)?: A Little Help needed standing up from a chair using your arms (e.g., wheelchair or bedside chair)?: A Little Help needed to walk in hospital room?: A Lot Help needed climbing 3-5 steps with a railing? : Total 6 Click Score: 15    End of Session Equipment Utilized During Treatment: Other (comment) (pt on trach collar at 28%) Activity Tolerance: Patient tolerated treatment well Patient  left: with call bell/phone within reach;in chair;with chair alarm set;with family/visitor present Nurse Communication: Mobility status PT Visit Diagnosis: Difficulty in walking, not elsewhere classified (R26.2);Unsteadiness on feet (R26.81);Other abnormalities of gait and mobility (R26.89);Muscle weakness (generalized) (M62.81) Pain - Right/Left: Right Pain - part of body:  (back)     Time: 7416-3845 PT Time Calculation (min) (ACUTE ONLY): 30 min  Charges:  $Therapeutic Exercise: 8-22 mins $Therapeutic Activity: 8-22 mins                     Rayelle Armor M,PT Acute Rehab Services [phone] (pager)   Alvira Philips 11/15/2020, 11:38 AM

## 2020-11-15 NOTE — Progress Notes (Signed)
Patient ID: Devin [name], male   DOB: [birth year], 80 y.o.   MRN: [id number] Bevely Palmer looks stable with ongoin TFs No bleeding Off elliquis  Please call back if gtube needs any attention

## 2020-11-15 NOTE — Progress Notes (Signed)
Nutrition Follow-up  DOCUMENTATION CODES:   Non-severe (moderate) malnutrition in context of chronic illness  INTERVENTION:   Continue TF via PEG:  Vital AF 1.2 at 75 ml/h  Provides 2160 kcal, 135 gm protein, 1460 ml free water daily  MVI with minerals via tube daily.   NUTRITION DIAGNOSIS:   Moderate Malnutrition related to chronic illness (Crohn's disease, Lung cancer) as evidenced by mild fat depletion,moderate fat depletion,mild muscle depletion,moderate muscle depletion.  Ongoing   GOAL:   Patient will meet greater than or equal to 90% of their needs  Met with TF  MONITOR:   Vent status,TF tolerance  REASON FOR ASSESSMENT:   Ventilator,Consult Enteral/tube feeding initiation and management  ASSESSMENT:   80 yo male admitted from rehab on 5/24 with post op hypotension S/P PEG. PMH includes Crohn's disease, ankylosing spondylitis, osteoporosis, hypothyroidism, DM-2, HTN, prostate CA, A fib. Recent hospital admission 4/3 for spinal fracture s/p fall at home. Admission was complicated by prolonged mechanical ventilation ultimately requiring tracheostomy on 5/3. Transferred to CIR 5/5.  Discussed patient in ICU rounds and with RN today. Trach exchanged 6/7. Has been on trach collar since Sunday. Vent has been removed from the room.   Plans for MBS with SLP today.  TF was was held 6/6 due to bleeding around PEG site. Additional sutures were placed and TF was resumed 6/7.  Currently receiving TF via PEG: Vital AF 1.2 at 75 ml/h, tolerating well to meet 100% of estimated needs.   Labs reviewed. Na 129 (6/7) CBG: 169-160-151  Medications reviewed and include cholestyramine, pepcid, novolog, MVI with minerals, protonix.  I/O +454 ml since admission UOP 2460 ml x 24 hours  Admission weight 88.2 kg Current weight 84.4 kg  Diet Order:   Diet Order            Diet NPO time specified Except for: Ice Chips  Diet effective now                 EDUCATION NEEDS:    No education needs have been identified at this time  Skin:  Skin Integrity Issues:: Stage II Stage II: vertebral column Other: MASD to perineum  Last BM:  6/7 type 7  Height:   Ht Readings from Last 1 Encounters:  10/31/20 5' 11"  (1.803 m)    Weight:   Wt Readings from Last 1 Encounters:  11/15/20 84.4 kg    BMI:  Body mass index is 25.95 kg/m.  Estimated Nutritional Needs:   Kcal:  2100-2300  Protein:  130-140 gm  Fluid:  >/= 2.1 L    Lucas Mallow, RD, LDN, CNSC Please refer to Amion for contact information.

## 2020-11-15 NOTE — Progress Notes (Signed)
eLink Physician-Brief Progress Note Patient Name: Devin [name] DOB: 1941-01-10 MRN: [id number]   Date of Service  11/15/2020  HPI/Events of Note  Patient with oliguria secondary to urinary retention, bladder scan with > 600 ml of urine in the bladder.  eICU Interventions  Foley in / out catheterization vs Foley insertion protocol ordered.          Kerry Kass Jen Benedict 11/15/2020, 6:24 AM

## 2020-11-16 DIAGNOSIS — I4811 Longstanding persistent atrial fibrillation: Secondary | ICD-10-CM

## 2020-11-16 DIAGNOSIS — T8119XA Other postprocedural shock, initial encounter: Principal | ICD-10-CM

## 2020-11-16 DIAGNOSIS — M459 Ankylosing spondylitis of unspecified sites in spine: Secondary | ICD-10-CM

## 2020-11-16 DIAGNOSIS — L8990 Pressure ulcer of unspecified site, unspecified stage: Secondary | ICD-10-CM | POA: Insufficient documentation

## 2020-11-16 LAB — BASIC METABOLIC PANEL
Anion gap: 7 (ref 5–15)
BUN: 31 mg/dL — ABNORMAL HIGH (ref 8–23)
CO2: 26 mmol/L (ref 22–32)
Calcium: 8.5 mg/dL — ABNORMAL LOW (ref 8.9–10.3)
Chloride: 94 mmol/L — ABNORMAL LOW (ref 98–111)
Creatinine, Ser: 0.92 mg/dL (ref 0.61–1.24)
GFR, Estimated: >60 mL/min (ref >60)
Glucose, Bld: 172 mg/dL — ABNORMAL HIGH (ref 70–99)
Potassium: 4.2 mmol/L (ref 3.5–5.1)
Sodium: 127 mmol/L — ABNORMAL LOW (ref 135–145)

## 2020-11-16 LAB — CBC
HCT: 26.5 % — ABNORMAL LOW (ref 39.0–52.0)
Hemoglobin: 8.8 g/dL — ABNORMAL LOW (ref 13.0–17.0)
MCH: 31.5 pg (ref 26.0–34.0)
MCHC: 33.2 g/dL (ref 30.0–36.0)
MCV: 95 fL (ref 80.0–100.0)
Platelets: 308 10*3/uL (ref 150–400)
RBC: 2.79 MIL/uL — ABNORMAL LOW (ref 4.22–5.81)
RDW: 14.6 % (ref 11.5–15.5)
WBC: 11.4 10*3/uL — ABNORMAL HIGH (ref 4.0–10.5)
nRBC: 0 % (ref 0.0–0.2)

## 2020-11-16 LAB — HEMOGLOBIN A1C
Hgb A1c MFr Bld: 5 % (ref 4.8–5.6)
Mean Plasma Glucose: 97 mg/dL

## 2020-11-16 LAB — GLUCOSE, CAPILLARY
Glucose-Capillary: 143 mg/dL — ABNORMAL HIGH (ref 70–99)
Glucose-Capillary: 201 mg/dL — ABNORMAL HIGH (ref 70–99)
Glucose-Capillary: 155 mg/dL — ABNORMAL HIGH (ref 70–99)
Glucose-Capillary: 154 mg/dL — ABNORMAL HIGH (ref 70–99)
Glucose-Capillary: 146 mg/dL — ABNORMAL HIGH (ref 70–99)

## 2020-11-16 MED ORDER — ORAL CARE MOUTH RINSE
15.0000 mL | Freq: Two times a day (BID) | OROMUCOSAL | Status: DC
  Administered 2020-11-16 – 2020-11-20 (×10): 15 mL via OROMUCOSAL

## 2020-11-16 MED ORDER — CHLORHEXIDINE GLUCONATE 0.12 % MT SOLN
15.0000 mL | Freq: Two times a day (BID) | OROMUCOSAL | Status: DC
  Administered 2020-11-16 – 2020-11-20 (×9): 15 mL via OROMUCOSAL
  Filled 2020-11-16 (×7): qty 15

## 2020-11-16 MED ORDER — DILTIAZEM HCL 25 MG/5ML IV SOLN
10.0000 mg | Freq: Once | INTRAVENOUS | Status: DC | PRN
  Filled 2020-11-16 (×2): qty 5

## 2020-11-16 MED ORDER — GUAIFENESIN 100 MG/5ML PO SOLN
15.0000 mL | Freq: Four times a day (QID) | ORAL | Status: DC
  Administered 2020-11-16 – 2020-11-20 (×16): 300 mg
  Filled 2020-11-16 (×3): qty 15
  Filled 2020-11-16: qty 45
  Filled 2020-11-16: qty 15
  Filled 2020-11-16: qty 45
  Filled 2020-11-16 (×6): qty 15

## 2020-11-16 MED ORDER — LEVOTHYROXINE SODIUM 75 MCG PO TABS
150.0000 ug | ORAL_TABLET | Freq: Every day | ORAL | Status: DC
  Administered 2020-11-17 – 2020-11-20 (×4): 150 ug
  Filled 2020-11-16 (×4): qty 2

## 2020-11-16 NOTE — Progress Notes (Signed)
Patient's daughter, [name], notified of room change. Attempted to call wife and other daughter but no answer for those family members. [name] states she will convey that information to the rest of the family.

## 2020-11-16 NOTE — Progress Notes (Signed)
NAME:  Devin [name], MRN:  [id number], DOB:  [birth year], LOS: 69 ADMISSION DATE:  10/31/2020, CONSULTATION DATE:  5/3 REFERRING MD:  Verlon Au, CHIEF COMPLAINT:  Dyspnea   History of Present Illness:  80 y/o had a fall and spinal fracture with post admission course complicated by prolonged mechanical ventilation and 2 extubation failures, ultimately requiring a tracheostomy.  There was concern for supra and sub-glottic airway narrowing.  XL tracheostomy ultimately placed on 5/3.  Moved to Rehab and has been working with them there.   On 5/24 underwent PEG tube placement in the OR with Dr. Donne Hazel complicated by intra-abdominal bleeding requiring emergent re-exploration.   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 Was able to bypass with distal XLT. 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 Voice quality better. Feeling stronger  5/16 foley placed by urology. working with OT, talking  5/19 capping trials with tracheostomy 5/24 PEG placement in OR. PCCM reconsulted on for hypotension. 6.0 XLTD cuffless swithced to 6.0 XLTD cuffed. CVC.  Went to OR for ex lap, washout, ligation of omental artery bleed 5/25 stabilized, start PS trials 5/26 - 5/30 Not tolerating trach collar trials for >1 hour 5/31 A-fib rate of 160 while OOB., tolerating trach collar 6/1 rate  controlled and Eliquis resumed  Tolerating trach collar for short period 6/4 4 hours trach collar, new infiltrate, WBC up 6/5 cefepime for HCAP.  bleeding around PEG site. Eliquis stopped again. Had to have surg place suture.  6/6 resuming PMV trials. Was off vent all night  6/7 removed vent from room. Asking SLP to re-eval for swallowing. Trach changed to Cuffless distal 6 XLT. Evaluation of subglottic site specifically the area of granulation that had been present at the distal end of the regular 6 trach had resolved completely. 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 ABX changed to Ancef. Stop date placed. LTAC referral placed. Foley removed.  [facility] assumed care. 6/8 repeat MBS-->failed; still aspirating but perhaps making slow improvement. Foley replaced d/t retention.   Interim History / Subjective:   Having a little more "phlegm" today. Otherwise no new changes"   Objective   Blood pressure (Abnormal) 117/51, pulse 81, temperature 98.8 F (37.1 C), temperature source Oral, resp. rate (Abnormal) 25, weight 85.1 kg, SpO2 97 %.    FiO2 (%):  [21 %] 21 %   Intake/Output Summary (Last 24 hours) at 11/16/2020 0735 Last data filed at 11/16/2020 0600 Gross per 24 hour  Intake 1650 ml  Output 1370 ml  Net 280 ml   Filed Weights   11/14/20 0500 11/15/20 0500 11/16/20 0500  Weight: 84.4 kg 84.4 kg 85.1 kg    Examination:  General 80 year old male sitting up in bed HENT NCAT no JVD. MMM trach w/ PMV in place and is unremarkable  Pulm scattered rhonchi no accessory use  Card Reg irreg systolic HM Abd PEG in place EXT LE edema brisK CR Neuro intact  GU foley in place.  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 Sutured (see surg notes last on 6/7 and 6/8) DM2 Atrial fibrillation: rate controlled not AC candidate d/t recurrent episodes of life threatening bleeding  Intermittent Fluid electrolyte imbalance: Hyponatremia Crohn's disease Anxiety, clinical depression and Insomnia: doing better since added Paxil during this stay  Hypothyroidism Deconditioning Urinary retention.--> foley last replaced 6/8. Cont Urecholine VT; would keep foley in place and have him f/u w/ urology    Pulm problem list  Trach dependence s/p prolonged critical illness.  Dysphagia w/ on-going aspiration HCAP (likely aspiration: NOS) Severe physical deconditioning.   Discussion This is a 80 year old male I have been following for trach management. I was disappointed to see that he failed his swallow eval again. It does sound as though this is slowly improving. Today he is having some increased mucous and some trouble expectorating it. He is NOT a candidate for decannulation as of yet. I do think that once he passes his swallow eval our chances of getting the trach out will be much greater .  Plan Cont PMV as tolerated  Cont routine trach care (I'd keep the 6 distal XLT) Wean oxygen  Add guaifenesin Ancef stop date placed.  Cont to work on [place] SLP support  I continue to think he will benefit most from [place] setting.   We will follow on a Tuesday and Thursday schedule while he remains here in the hospital    Best Practice (right click and "Reselect all SmartList Selections" daily)   Diet/type: tubefeeds Pain/Anxiety/Delirium protocol Not  indicated VAP protocol (if indicated): Not indicated DVT prophylaxis: SCD GI prophylaxis: PPI Glucose control:  SSI and Basal coverage Central venous access:  N/A Arterial line:  N/A Foley:  Yes, and it is still needed Mobility:  OOB  PT consulted: Yes Studies pending: None Culture data pending:none Last reviewed culture data:today Antibiotics:ancef Antibiotic de-escalation:ordered Stop date: ordered Daily labs: not indicated Code Status:  DNR Last date of multidisciplinary goals of care discussion [per palliative] Prognosis: Not life-threatening Disposition:ready for transfer to progressive care     Critical care time: [place] ACNP-BC [place] Pager # [phone] OR # [phone] if no answer

## 2020-11-16 NOTE — Consult Note (Addendum)
Urology Consult   Physician requesting consult: Debbe Odea, MD  Reason for consult: Acute urinary retention  History of Present Illness: Devin [name] is a 80 y.o. with a past medical history of lung cancer s/p left upper lobectomy, diabetes, Crohn's disease, thyroid cancer, metastatic prostate cancer, A. fib, Chance fracture status postrepair who has had a prolonged hospital course complicated by sepsis secondary to Klebsiella pneumonia.  He has a Foley catheter in place draining clear yellow urine.  He was found to have urinary tract infection with urine culture on 11/12/2020 with greater than 100,000 Proteus.  He is currently on Keflex.  Urology was consulted on 10/23/2020 after he was found to be in urinary retention.  Foley catheter was placed by Dr. Junious Silk on 5/16.  He then failed a void trial and reports failing multiple void trials.  He follows with Dr. Alinda Money for metastatic prostate cancer.  He is status post radical prostatectomy by Dr. Terance Hart in 06/2001 and found to have rising PSA 4.27 and 2012.  Staging evaluation demonstrated metastatic disease with a 1.8 cm common iliac lymph node and probable bone metastasis at the sternum.  He began androgen deprivation therapy in 2013 for which he continues.  His most recent PSA on 07/07/2020 was undetectable with testosterone castrate.  Patient states that prior to this hospitalization, he had no significant lower urinary tract symptoms.  He reported a strong flow of stream with no sensation of incomplete bladder emptying.  He denied increased frequency, urgency, straining to void or nocturia.   He denies a history of voiding or storage urinary symptoms, hematuria, UTIs, STDs, urolithiasis, GU malignancy/trauma/surgery.  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    Obesity (BMI 30-39.9)    Osteoporosis    Pt completed 5 years of fosamax in 2013      Prostate cancer with recurrence    Treated with prostatectomy with recurrence 2012 with Lupron treatment now him    Psoriasis    Thyroid cancer ([place]) 1999   Treated with RAI and total thyroidectomy    Type II diabetes mellitus ([place])     Past Surgical History:  Procedure Laterality Date   ABDOMINAL EXPLORATION SURGERY     for small bowel obstruction   APPENDECTOMY  03/2007   BACK SURGERY     BASAL CELL CARCINOMA EXCISION  "several"   "head"   CARDIAC CATHETERIZATION  03/17/2003   CHOLECYSTECTOMY N/A 05/07/2016   Procedure: LAPAROSCOPIC CHOLECYSTECTOMY;  Surgeon: Fanny Skates, MD;  Location: [place];  Service: General;  Laterality: N/A;   COLON SURGERY  03/2007   Resection of cecum, appendix, terminal ileum (approximately/notes 10/10/2010   CYSTOSCOPY/URETEROSCOPY/HOLMIUM LASER/STENT PLACEMENT Right 07/03/2020   Procedure: CYSTOSCOPY/RETROGRADE/URETEROSCOPY REMOVAL OF BLADDER STONE;  Surgeon: Raynelle Bring, MD;  [facility];  Service: Urology;  Laterality: Right;   DIRECT LARYNGOSCOPY N/A 10/04/2020   Procedure: DIRECT LARYNGOSCOPY, PHARYNGOSCOPY WITH BIOPSY;  Surgeon: [name], DO;  Location: [facility];  Service: ENT;  Laterality: N/A;   ESOPHAGOSCOPY  N/A 10/04/2020   Procedure: ESOPHAGOSCOPY WITH BIOPSY;  Surgeon: [name], DO;  Location: [facility];  Service: ENT;  Laterality: N/A;   GASTROSTOMY N/A 10/31/2020   Procedure: INSERTION OF GASTROSTOMY TUBE;  Surgeon: Rolm Bookbinder, MD;  Location: [place];  Service: General;  Laterality: N/A;   HERNIA REPAIR     LAMINECTOMY WITH POSTERIOR LATERAL ARTHRODESIS LEVEL 1 N/A 09/15/2020   Procedure: REVISION OF THORACOLUMBAR FUSION,  ADDITION OF CROSS-CONNECTORS;  Surgeon: Eustace Moore, MD;  Location: [facility];  Service: Neurosurgery;  Laterality: N/A;   LAPAROSCOPIC CHOLECYSTECTOMY  05/07/2016   LAPAROSCOPIC LYSIS OF ADHESIONS  05/07/2016   LAPAROSCOPIC LYSIS OF ADHESIONS N/A 05/07/2016   Procedure: LAPAROSCOPIC LYSIS OF ADHESIONS TIMES ONE HOUR;  Surgeon: Fanny Skates, MD;  Location: [facility];  Service: General;  Laterality: N/A;   LAPAROTOMY N/A 10/31/2020   Procedure: EXPLORATORY LAPAROTOMY WITH [place];  Surgeon: Rolm Bookbinder, MD;  Location: [facility];  Service: General;  Laterality: N/A;   POSTERIOR FUSION THORACIC SPINE  02/08/2016   1. Posterior thoracic arthrodesis T7-T11 utilizing morcellized allograft, 2. Posterior thoracic segmental fixation T7-T11 utilizing nuvasive pedicle screws   PROSTATECTOMY  06/2001   w/bilateral pelvic lymph nose dissection/notes 10/24/2010   SPINAL FUSION  12/2014   Open reduction internal fixation of L1 Chance fracture with posterior fusion T10-L4 utilizing morcellized allograft and some local autograft, segmental instrumentation T10-L4 inclusive utilizing nuvasive pedicle screws/notes 12/16/2014   Stress Cardiolite  02/17/2003   THOROCOTOMY WITH LOBECTOMY  03/16/2015   Procedure: THOROCOTOMY WITH LOBECTOMY;  Surgeon: Ivin Poot, MD;  Location: [facility];  Service: Thoracic;;   TONSILLECTOMY     TOTAL THYROIDECTOMY  1997   Venous Doppler  05/30/2004   VENTRAL HERNIA REPAIR  04/14/2008   VIDEO ASSISTED THORACOSCOPY Left 03/16/2015   Procedure: VIDEO ASSISTED THORACOSCOPY;  Surgeon: Ivin Poot, MD;  Location: [facility];  Service: Thoracic;  Laterality: Left;    Medications:  Home meds:  No current facility-administered medications on file prior to encounter.   Current Outpatient Medications on File Prior to Encounter  Medication Sig Dispense Refill   acetaminophen (TYLENOL) 160 MG/5ML solution Place 15.6 mLs (500 mg total) into feeding tube every 6 (six) hours. 120 mL 0    acetylcysteine (MUCOMYST) 20 % nebulizer solution Take 3 mLs (600 mg total) by nebulization 2 (two) times daily. 30 mL 12   albuterol (VENTOLIN HFA) 108 (90 Base) MCG/ACT inhaler Inhale 2 puffs into the lungs every 6 (six) hours as needed for wheezing or shortness of breath. 8 g 6   ALPRAZolam (XANAX) 0.5 MG tablet Take 0.5 mg by mouth 2 (two) times daily as needed for anxiety.     apixaban (ELIQUIS) 5 MG TABS tablet Take 5 mg by mouth 2 (two) times daily.     atenolol (TENORMIN) 25 MG tablet Take 1 tablet (25 mg total) by mouth 2 (two) times daily.     budesonide (PULMICORT) 0.5 MG/2ML nebulizer solution Take 2 mLs (0.5 mg total) by nebulization 2 (two) times daily.  12   cholestyramine (QUESTRAN) 4 g packet Place 1 packet (4 g total) into feeding tube 2 (two) times daily before lunch and supper. 60 each 12   diltiazem (CARDIZEM) 10 mg/ml oral suspension Place 3 mLs (30 mg total) into feeding tube every 6 (six) hours.     diphenhydrAMINE (BENADRYL) 25 mg capsule 1 capsule (25 mg total) by Per NG tube route at bedtime as needed for sleep. Tyronza  capsule 0   fiber (NUTRISOURCE FIBER) PACK packet Place 1 packet into feeding tube 2 (two) times daily.     insulin aspart (NOVOLOG) 100 UNIT/ML injection Inject 0-20 Units into the skin every 4 (four) hours. 10 mL 11   insulin glargine (LANTUS) 100 UNIT/ML injection Inject 0.12 mLs (12 Units total) into the skin 2 (two) times daily. 10 mL 11   lactobacillus (FLORANEX/LACTINEX) PACK Place 1 packet (1 g total) into feeding tube 3 (three) times daily with meals.     levothyroxine (SYNTHROID) 125 MCG tablet Take 1 tablet (125 mcg total) by mouth daily before breakfast. 30 tablet 0   loperamide HCl (IMODIUM) 1 MG/7.5ML suspension Place 15 mLs (2 mg total) into feeding tube 2 (two) times daily as needed for diarrhea or loose stools. 120 mL 0   loperamide HCl (IMODIUM) 1 MG/7.5ML suspension Place 30 mLs (4 mg total) into feeding tube 4 (four) times daily. 120 mL 0    nutrition supplement, JUVEN, (JUVEN) PACK Place 1 packet into feeding tube 2 (two) times daily between meals.  0   Nutritional Supplements (FEEDING SUPPLEMENT, PROSOURCE TF,) liquid Place 45 mLs into feeding tube daily.     Nystatin (GERHARDT'S BUTT CREAM) CREA Apply 1 application topically 2 (two) times daily.     ondansetron (ZOFRAN) 4 MG/2ML SOLN injection Inject 2 mLs (4 mg total) into the vein every 6 (six) hours as needed for nausea or vomiting. 2 mL 0   oxyCODONE (ROXICODONE) 5 MG/5ML solution Place 5 mLs (5 mg total) into feeding tube every 4 (four) hours as needed for moderate pain.  0   phenol (CHLORASEPTIC) 1.4 % LIQD Use as directed 1 spray in the mouth or throat as needed for throat irritation / pain.  0   piperacillin-tazobactam (ZOSYN) 3.375 GM/50ML IVPB Inject 50 mLs (3.375 g total) into the vein every 8 (eight) hours. 50 mL    tamsulosin (FLOMAX) 0.4 MG CAPS capsule Take 1 capsule (0.4 mg total) by mouth daily. 30 capsule    Water For Irrigation, Sterile (FREE WATER) SOLN Place 100 mLs into feeding tube every 8 (eight) hours.       Scheduled Meds:  sodium chloride   Intravenous Once   arformoterol  15 mcg Nebulization BID   bethanechol  10 mg Per Tube QID   budesonide  0.5 mg Nebulization BID   cephALEXin  500 mg Per Tube Q6H   chlorhexidine  15 mL Mouth Rinse BID   Chlorhexidine Gluconate Cloth  6 each Topical Daily   cholestyramine  4 g Per Tube BID   diltiazem  30 mg Per Tube Q6H   Gerhardt's butt cream   Topical BID   guaiFENesin  15 mL Per Tube Q6H   insulin aspart  0-9 Units Subcutaneous Q4H   insulin glargine  12 Units Subcutaneous QHS   [START ON 11/17/2020] levothyroxine  150 mcg Per Tube Q0600   mouth rinse  15 mL Mouth Rinse q12n4p   multivitamin with minerals  1 tablet Per Tube Daily   pantoprazole (PROTONIX) IV  40 mg Intravenous Q24H   PARoxetine  10 mg Per Tube Daily   scopolamine  1 patch Transdermal Q72H   sodium chloride flush  10-40 mL Intracatheter  Q12H   sulfaSALAzine  500 mg Per Tube Daily   tamsulosin  0.4 mg Oral Daily   traZODone  50 mg Per Tube QHS   Continuous Infusions:  sodium chloride 250 mL (11/03/20 0324)   feeding supplement (  VITAL AF 1.2 CAL) 1,000 mL (11/15/20 1445)   PRN Meds:.acetaminophen, clonazePAM, ipratropium-albuterol, ondansetron (ZOFRAN) IV, oxyCODONE, sodium chloride flush  Allergies: No Known Allergies  Family History  Problem Relation Age of Onset   CAD Mother    Cancer Neg Hx        No cancer in the patient's immediate family, except of course for the patient himself, as noted    Social History:  reports that he quit smoking about 30 years ago. His smoking use included cigarettes. He has a 60.00 pack-year smoking history. He has never used smokeless tobacco. He reports previous alcohol use. He reports that he does not use drugs.  ROS: A complete review of systems was performed.  All systems are negative except for pertinent findings as noted.  Physical Exam:  Vital signs in last 24 hours: Temp:  [97.8 F (36.6 C)-98.8 F (37.1 C)] 98.1 F (36.7 C) (06/09 1600) Pulse Rate:  [78-91] 84 (06/09 1600) Resp:  [18-28] 22 (06/09 1600) BP: (106-143)/(48-89) 106/68 (06/09 1600) SpO2:  [91 %-98 %] 98 % (06/09 1600) FiO2 (%):  [21 %] 21 % (06/09 1559) Weight:  [85.1 kg] 85.1 kg (06/09 0500) Constitutional:  Alert and oriented, No acute distress Cardiovascular: Regular rate and rhythm Respiratory: No increased work of breathing GI: Abdomen is soft, nontender, nondistended, no abdominal masses Genitourinary: Foley catheter in place draining clear yellow urine. Rectal: Normal sphincter tone, no rectal masses, prostate surgically absent Neurologic: Grossly intact, no focal deficits Psychiatric: Normal mood and affect  Laboratory Data:  Recent Labs    11/14/20 0035 11/15/20 0207 11/16/20 0052  WBC  --  13.0* 11.4*  HGB 9.0* 8.5* 8.8*  HCT 27.7* 26.3* 26.5*  PLT  --  362 308    Recent Labs     11/14/20 0035 11/16/20 0052  NA 129* 127*  K 5.0 4.2  CL 94* 94*  GLUCOSE 100* 172*  BUN 32* 31*  CALCIUM 9.2 8.5*  CREATININE 0.97 0.92     Results for orders placed or performed during the hospital encounter of 10/31/20 (from the past 24 hour(s))  Glucose, capillary     Status: Abnormal   Collection Time: 11/15/20  7:28 PM  Result Value Ref Range   Glucose-Capillary 179 (H) 70 - 99 mg/dL  Glucose, capillary     Status: Abnormal   Collection Time: 11/15/20 11:14 PM  Result Value Ref Range   Glucose-Capillary 183 (H) 70 - 99 mg/dL  Basic metabolic panel     Status: Abnormal   Collection Time: 11/16/20 12:52 AM  Result Value Ref Range   Sodium 127 (L) 135 - 145 mmol/L   Potassium 4.2 3.5 - 5.1 mmol/L   Chloride 94 (L) 98 - 111 mmol/L   CO2 26 22 - 32 mmol/L   Glucose, Bld 172 (H) 70 - 99 mg/dL   BUN 31 (H) 8 - 23 mg/dL   Creatinine, Ser 0.92 0.61 - 1.24 mg/dL   Calcium 8.5 (L) 8.9 - 10.3 mg/dL   GFR, Estimated >60 >60 mL/min   Anion gap 7 5 - 15  CBC     Status: Abnormal   Collection Time: 11/16/20 12:52 AM  Result Value Ref Range   WBC 11.4 (H) 4.0 - 10.5 K/uL   RBC 2.79 (L) 4.22 - 5.81 MIL/uL   Hemoglobin 8.8 (L) 13.0 - 17.0 g/dL   HCT 26.5 (L) 39.0 - 52.0 %   MCV 95.0 80.0 - 100.0 fL   MCH 31.5 26.0 -  34.0 pg   MCHC 33.2 30.0 - 36.0 g/dL   RDW 14.6 11.5 - 15.5 %   Platelets 308 150 - 400 K/uL   nRBC 0.0 0.0 - 0.2 %  Glucose, capillary     Status: Abnormal   Collection Time: 11/16/20  3:20 AM  Result Value Ref Range   Glucose-Capillary 143 (H) 70 - 99 mg/dL  Glucose, capillary     Status: Abnormal   Collection Time: 11/16/20  7:31 AM  Result Value Ref Range   Glucose-Capillary 201 (H) 70 - 99 mg/dL  Glucose, capillary     Status: Abnormal   Collection Time: 11/16/20 11:12 AM  Result Value Ref Range   Glucose-Capillary 155 (H) 70 - 99 mg/dL  Glucose, capillary     Status: Abnormal   Collection Time: 11/16/20  3:43 PM  Result Value Ref Range    Glucose-Capillary 154 (H) 70 - 99 mg/dL   Recent Results (from the past 240 hour(s))  Culture, Urine     Status: Abnormal   Collection Time: 11/12/20  3:10 AM   Specimen: Urine, Catheterized  Result Value Ref Range Status   Specimen Description URINE, CATHETERIZED  Final   Special Requests   Final    Normal Performed at [facility], [address] [place], [place] [address]    Culture >=100,000 COLONIES/mL PROTEUS MIRABILIS (A)  Final   Report Status 11/14/2020 FINAL  Final   Organism ID, Bacteria PROTEUS MIRABILIS (A)  Final      Susceptibility   Proteus mirabilis - MIC*    AMPICILLIN <=2 SENSITIVE Sensitive     CEFAZOLIN 8 SENSITIVE Sensitive     CEFEPIME <=0.12 SENSITIVE Sensitive     CEFTRIAXONE <=0.25 SENSITIVE Sensitive     CIPROFLOXACIN <=0.25 SENSITIVE Sensitive     GENTAMICIN 4 SENSITIVE Sensitive     IMIPENEM 8 INTERMEDIATE Intermediate     NITROFURANTOIN 128 RESISTANT Resistant     TRIMETH/SULFA <=20 SENSITIVE Sensitive     AMPICILLIN/SULBACTAM <=2 SENSITIVE Sensitive     PIP/TAZO <=4 SENSITIVE Sensitive     * >=100,000 COLONIES/mL PROTEUS MIRABILIS  Culture, Respiratory w Gram Stain     Status: None   Collection Time: 11/12/20  2:58 PM   Specimen: Tracheal Aspirate; Respiratory  Result Value Ref Range Status   Specimen Description TRACHEAL ASPIRATE  Final   Special Requests NONE  Final   Gram Stain   Final    MODERATE WBC PRESENT, PREDOMINANTLY PMN RARE GRAM POSITIVE COCCI RARE GRAM VARIABLE ROD    Culture   Final    RARE Normal respiratory flora-no Staph aureus or Pseudomonas seen Performed at [facility], [address] [place], [place] [address]    Report Status 11/14/2020 FINAL  Final    Renal Function: Recent Labs    11/10/20 0121 11/12/20 0136 11/14/20 0035 11/16/20 0052  CREATININE 1.05 1.01 0.97 0.92   Estimated Creatinine Clearance: 71.6 mL/min (by C-G formula based on SCr of 0.92 mg/dL).  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  Ankylosing spondylitis ([place])  Diagnosed during lumbar fracture summer of 2016    Crohn's disease ([place])   Gout   HIstory of basal cell cancer of face   THYROID CA HX  History of kidney stones   Hypertension   Hypothyroidism   Impotence   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   Obesity (BMI 30-39.9)   Osteoporosis   Pt completed 5 years of fosamax in 2013     Prostate cancer with recurrence   Treated with prostatectomy with recurrence 2012 with Lupron treatment now him   Psoriasis   Thyroid cancer ([place]) 1999  Treated with RAI and total thyroidectomy   Type II diabetes mellitus ([place])  Past Surgical History: Past Surgical History: Procedure Laterality Date  ABDOMINAL EXPLORATION SURGERY    for small bowel obstruction  APPENDECTOMY  03/2007  BACK SURGERY    BASAL CELL CARCINOMA EXCISION  "several"  "head"  CARDIAC CATHETERIZATION  03/17/2003  CHOLECYSTECTOMY N/A 05/07/2016  Procedure: LAPAROSCOPIC CHOLECYSTECTOMY;  Surgeon: Fanny Skates, MD;  Location: [facility];  Service: General;  Laterality: N/A;  COLON SURGERY  03/2007  Resection of cecum, appendix, terminal ileum (approximately/notes 10/10/2010  CYSTOSCOPY/URETEROSCOPY/HOLMIUM LASER/STENT PLACEMENT Right 07/03/2020  Procedure: CYSTOSCOPY/RETROGRADE/URETEROSCOPY REMOVAL OF BLADDER STONE;  Surgeon: Raynelle Bring, MD;  [facility];  Service: Urology;  Laterality: Right;  DIRECT LARYNGOSCOPY N/A 10/04/2020  Procedure: DIRECT LARYNGOSCOPY, PHARYNGOSCOPY WITH  BIOPSY;  Surgeon: [name], DO;  Location: [facility];  Service: ENT;  Laterality: N/A;  ESOPHAGOSCOPY N/A 10/04/2020  Procedure: ESOPHAGOSCOPY WITH BIOPSY;  Surgeon: [name], DO;  Location: [facility];  Service: ENT;  Laterality: N/A;  GASTROSTOMY N/A 10/31/2020  Procedure: INSERTION OF GASTROSTOMY TUBE;  Surgeon: Rolm Bookbinder, MD;  Location: [facility];  Service: General;  Laterality: N/A;  HERNIA REPAIR    LAMINECTOMY WITH POSTERIOR LATERAL ARTHRODESIS LEVEL 1 N/A 09/15/2020  Procedure: REVISION OF THORACOLUMBAR FUSION, ADDITION OF CROSS-CONNECTORS;  Surgeon: Eustace Moore, MD;  Location: [facility];  Service: Neurosurgery;  Laterality: N/A;  LAPAROSCOPIC CHOLECYSTECTOMY  05/07/2016  LAPAROSCOPIC LYSIS OF ADHESIONS  05/07/2016  LAPAROSCOPIC LYSIS OF ADHESIONS N/A 05/07/2016  Procedure: LAPAROSCOPIC LYSIS OF ADHESIONS TIMES ONE HOUR;  Surgeon: Fanny Skates, MD;  Location: [facility];  Service: General;  Laterality: N/A;  LAPAROTOMY N/A 10/31/2020  Procedure: EXPLORATORY LAPAROTOMY WITH [place];  Surgeon: Rolm Bookbinder, MD;  Location: [facility];  Service: General;  Laterality: N/A;  POSTERIOR FUSION THORACIC SPINE  02/08/2016  1. Posterior thoracic arthrodesis T7-T11 utilizing morcellized allograft, 2. Posterior thoracic segmental fixation T7-T11 utilizing nuvasive pedicle screws  PROSTATECTOMY  06/2001  w/bilateral pelvic lymph nose dissection/notes 10/24/2010  SPINAL FUSION  12/2014  Open reduction internal fixation of L1 Chance fracture with posterior fusion T10-L4 utilizing morcellized allograft and some local autograft, segmental instrumentation T10-L4 inclusive utilizing nuvasive pedicle screws/notes 12/16/2014  Stress Cardiolite  02/17/2003  THOROCOTOMY WITH LOBECTOMY  03/16/2015  Procedure: THOROCOTOMY WITH LOBECTOMY;  Surgeon: Ivin Poot, MD;  Location: [facility];  Service: Thoracic;;  TONSILLECTOMY    TOTAL THYROIDECTOMY  1997  Venous Doppler  05/30/2004  VENTRAL HERNIA REPAIR  04/14/2008  VIDEO ASSISTED  THORACOSCOPY Left 03/16/2015  Procedure: VIDEO ASSISTED THORACOSCOPY;  Surgeon: Ivin Poot, MD;  Location: [facility];  Service: Thoracic;  Laterality: Left; HPI: Pt is a 80 yo male admitted from [place] 5/24 to ICU after PEG placement and hypotension with intraabdominal bleed. Pt remained on vent post op. Pt with complex PMHx- admitted 09/10/20-10/06/20 after fall with T11 fx and L1  compression fx s/p PLIF T11-12 on 4/8. Pt with complex respiratory failure requiring trach 4/12.  Initial MBS 4/22 with severe pharyngeal dysphagia that related primarily to thickening of the prevertebral tissue at the level of C4-5. ENT consulted; supraglottic mass biopsied, negative for malignancy. 5/3 PCCM emergently consulted for inability to suction due to upper airway obstruction. Had significant increase of inflammatory tissue on posterior wall of trachea. Was able to bypass with distal XLT. 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  5/12 repeat MBS on CIR - persisting dysphagia with no functional improvement. 1/61 PEG complicated by intra-abdominal bleeding requiring emergent re-exploration, back on vent. 6/4 tolerating TC; 6/7 trach changed back to cuffless and vent removed from room. Resumed PMV use. Swallow consult 6/7.  Subjective: PMSV in place throughout the study Assessment / Plan / Recommendation CHL IP CLINICAL IMPRESSIONS 11/15/2020 Clinical Impression Pt was seen in radiology suite for modified barium swallow study. PMSV was in place throughout the study. Thickening of prevertebral tissue at the level of C4-5 continues to be noted and still impede complete epiglottic inversion, but this was slightly improved compared to the initial study on 09/29/20. Anterior laryngeal movement and pharyngeal constriction were reduced. Vallecular and posterior pharyngeal wall residue were noted. The prevertebral thickening facilitated penetration (PAS 3) of liquids secondary to spillage of posterior  pharyngeal wall residue over the arytenoids and into the larynx and to residue which accumulated on the laryngeal surface of the epiglottis during incomplete epiglottic inversion. A right head turn posture resulted in aspiration (PAS 7) of nectar thick liquids via tsp. A chin tuck posture with left head turn and effortful swallow improved epiglottic inversion and reduced frequency of penetration with honey and nectar thick liquids. No laryngeal invasion was noted with 1/2 tsp boluses of puree. Pt's independent use of an intermittent cough and secondary swallows was effective in clearing penetration (PAS 3), but coughing was ineffective for episodes of aspiration. Use of a combination of these multiple strategies shows therapeutic promise, but SLP anticipates that these will be laborious for the pt. Pt's NPO status will be continued with allowance of occasional ice chips following oral care. SLP will follow for continued treatment and is hopeful that p.o. intake (likely snacks of modified consistencies initially) can be initiated with training on compensatory strategies. SLP Visit Diagnosis Dysphagia, pharyngeal phase (R13.13) Attention and concentration deficit following -- Frontal lobe and executive function deficit following -- Impact on safety and function Moderate aspiration risk;Severe aspiration risk;Risk for inadequate nutrition/hydration   CHL IP TREATMENT RECOMMENDATION 11/15/2020 Treatment Recommendations Therapy as outlined in treatment plan below   Prognosis 11/15/2020 Prognosis for Safe Diet Advancement Fair Barriers to Reach Goals Severity of deficits;Time post onset Barriers/Prognosis Comment -- CHL IP DIET RECOMMENDATION 11/15/2020 SLP Diet Recommendations NPO;Alternative means - long-term Liquid Administration via -- Medication Administration Via alternative means Compensations -- Postural Changes --   CHL IP OTHER RECOMMENDATIONS 11/15/2020 Recommended Consults -- Oral Care Recommendations Oral care QID  Other Recommendations --   CHL IP FOLLOW UP RECOMMENDATIONS 11/15/2020 Follow up Recommendations Inpatient Rehab   CHL IP FREQUENCY AND DURATION 11/15/2020 Speech Therapy Frequency (ACUTE ONLY) min 2x/week Treatment Duration 2 weeks      CHL IP ORAL PHASE 11/15/2020 Oral Phase WFL Oral - Pudding Teaspoon -- Oral - Pudding Cup -- Oral - Honey Teaspoon -- Oral - Honey Cup -- Oral - Nectar Teaspoon -- Oral - Nectar Cup -- Oral - Nectar Straw -- Oral -  Thin Teaspoon -- Oral - Thin Cup -- Oral - Thin Straw -- Oral - Puree -- Oral - Mech Soft -- Oral - Regular -- Oral - Multi-Consistency -- Oral - Pill -- Oral Phase - Comment --  CHL IP PHARYNGEAL PHASE 11/15/2020 Pharyngeal Phase -- Pharyngeal- Pudding Teaspoon -- Pharyngeal -- Pharyngeal- Pudding Cup -- Pharyngeal -- Pharyngeal- Honey Teaspoon Reduced anterior laryngeal mobility;Reduced epiglottic inversion;Reduced airway/laryngeal closure;Pharyngeal residue - valleculae;Pharyngeal residue - posterior pharnyx;Reduced pharyngeal peristalsis Pharyngeal Material enters airway, remains ABOVE vocal cords and not ejected out Pharyngeal- Honey Cup Reduced anterior laryngeal mobility;Reduced epiglottic inversion;Reduced airway/laryngeal closure;Pharyngeal residue - valleculae;Pharyngeal residue - posterior pharnyx;Reduced pharyngeal peristalsis Pharyngeal Material enters airway, remains ABOVE vocal cords and not ejected out Pharyngeal- Nectar Teaspoon Reduced anterior laryngeal mobility;Reduced epiglottic inversion;Reduced airway/laryngeal closure;Pharyngeal residue - valleculae;Pharyngeal residue - posterior pharnyx;Reduced pharyngeal peristalsis Pharyngeal -- Pharyngeal- Nectar Cup Reduced anterior laryngeal mobility;Reduced epiglottic inversion;Reduced airway/laryngeal closure;Pharyngeal residue - valleculae;Pharyngeal residue - posterior pharnyx;Reduced pharyngeal peristalsis Pharyngeal -- Pharyngeal- Nectar Straw -- Pharyngeal -- Pharyngeal- Thin Teaspoon -- Pharyngeal --  Pharyngeal- Thin Cup -- Pharyngeal -- Pharyngeal- Thin Straw -- Pharyngeal -- Pharyngeal- Puree Reduced anterior laryngeal mobility;Reduced epiglottic inversion;Reduced airway/laryngeal closure;Pharyngeal residue - valleculae;Pharyngeal residue - posterior pharnyx;Reduced pharyngeal peristalsis Pharyngeal -- Pharyngeal- Mechanical Soft -- Pharyngeal -- Pharyngeal- Regular -- Pharyngeal -- Pharyngeal- Multi-consistency -- Pharyngeal -- Pharyngeal- Pill -- Pharyngeal -- Pharyngeal Comment --  [facility] IP CERVICAL ESOPHAGEAL PHASE 11/15/2020 Cervical Esophageal Phase WFL Pudding Teaspoon -- Pudding Cup -- Honey Teaspoon -- Honey Cup -- Nectar Teaspoon -- Nectar Cup -- Nectar Straw -- Thin Teaspoon -- Thin Cup -- Thin Straw -- Puree -- Mechanical Soft -- Regular -- Multi-consistency -- Pill -- Cervical Esophageal Comment -- Shanika I. Hardin Negus, [place], [place] Office number [phone] Pager [place] 11/15/2020, 3:53 PM               I independently reviewed the above imaging studies.  Impression/Recommendation Urinary retention: He has failed multiple void trials this admission.  Etiology possibly related to urinary tract infection, deconditioning, bladder neck stenosis or hypofunctioning bladder.  This will need to be worked up outpatient with urodynamics and cystoscopy. I wouldn't attempt repeat void trial until he is mobilizing more. For now, I recommend leaving Foley catheter in place and we will arrange for urodynamics outpatient.  I will message Dr. Alinda Money of this consultation in order to arrange for follow-up. Urinary tract infection: Urine culture on 11/12/2020 with 100,000 Proteus.  He remains on cephalexin which is sensitive. Metastatic prostate cancer: His last PSA remains undetectable on ADT.  Matt R. Trinda Harlacher MD 11/16/2020, 5:58 PM  [facility]  Pager: [phone]   CC: Debbe Odea, MD

## 2020-11-16 NOTE — Progress Notes (Addendum)
PROGRESS NOTE    Devin [name]   TMH:[id number]  DOB: 23-Aug-1940  DOA: 10/31/2020 PCP: Prince Solian, MD   Brief Narrative:  Devin [name] is a 80 year old male with diabetes mellitus, lung adenocarcinoma status post left upper lobectomy, Crohn's disease, thyroid cancer, metastatic prostate cancer, atrial fibrillation, ankylosing spondylitis who is status post T11-12 fusion after a chance fracture.  He was hospitalized from 4/3 through 4/29 after a fall and development of compression fracture which were fused as mentioned. Hospital course was complicated by sepsis secondary to Klebsiella pneumonia.  He was found to have supraglottic and subglottic stenosis and tracheomalacia and requite a tracheostomy. He was discharged to CIR with a trach and a core track on 4/29. Patient was readmitted to the hospital on 4/30 from CIR after the core track dislodged and was unable to be reinserted.   On 5/3 his trach was changed and subsequently, he was unable to be suctioned via this trach.  PCCM was consulted and it was discovered that he had significant inflammatory tissue in the posterior aspect of the trachea.  PCCM was able to pass an XLT.  On 5/5, he was discharged back to [facility] inpatient rehab.  On 5/24, a PEG tube was placed by general surgery and patient was hypotensive postop.  He was started on phenylephrine and admitted to the ICU.  He was found to be acutely anemic with a drop in hemoglobin from 12.9-8.8.  2 units of packed red blood cells were ordered he was taken back to the OR.  An ex lap with ligation of omentum and washout of hemoperitoneum was performed.    On 6/6 he was noted to have bleeding around the gastrostomy again and tube feeds were discontinued.  The tube was sutured once again.  General surgery recommended against anticoagulation in the future due to ongoing risk for bleeding from the site.  Needs were resumed. On 6/8 the patient was transferred to the triad hospitalist  service.  Subjective: He has no complaints today.      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 If from Lasix that was given a couple of days ago- recheck tomorrow    Postsurgical hypothyroidism - TSH 27.901< 17.9< 11< 9 -he has been on Synthroid 125 mcg in the hospital and while at CIR - will increase Synthroid to 150 - recheck TSH in a few wks   Atrial fibrillation, paroxysmal CHA2DS2-VASc Score 5 - holding Eliquis due to bleeding from PEG - cont Diltiazem 30 Q 6 hrs per tube   UTI with urinary retention - He was on Cefepime and this was changed to Ancef to treat a UTI after the sputum culture resulted with normal flora-   will continue to treat for UTI with 7 days total - switched Ancef to Keflex - foley was initially placed on 5/16 by urology- voiding trial given but had to be replaced on 6/8 as he was still retaining a large amount of urine  - cont Flomax    DM (diabetes mellitus), type 2 with renal complications  -   Lantus on hold- on Novolog SS - will change to Lantus 12 U and low dose Novolog scale   Essential hypertension -  Atenolol on hold- BP is normal    Crohn's disease  - cont Sulfasalazine   h/o  Ankylosing spondylitis   Unstable T11-12 chance fracture - s/p Posterior thoracic fusion T11-12 with morselized allograft, segmental fixation  T9-L1 on 4/8    Malnutrition of moderate degree - cont tube feeds which he is tolerating well - SLP following - MBS today> Thickening of prevertebral tissue at the level of C4-5 continues to be noted and still impede complete epiglottic inversion, but this was slightly improved compared to the initial study on 09/29/20. Recommended NPO for now    H/o Prostate cancer  Adenocarcinoma of left lung, stage 1 S/P lobectomy of lung  Time spent in minutes: 35 DVT prophylaxis: Place and maintain sequential compression device Start: 11/13/20 0811 SCDs Start: 10/31/20 1451 Code Status: DNR Family Communication: daughter at bedside Level of Care: Level of care: Progressive Disposition Plan:  Status is: Inpatient  Remains inpatient appropriate because:Inpatient level of care appropriate due to severity of illness   Dispo: The patient is from: SNF              Anticipated d/c is to: LTAC vs CIR              Patient currently is not medically stable to d/c.   Difficult to place patient No   Consultants:  PCCM General surgery Palliative care Procedures:  . PEG tube placement Central line Trach changes Antimicrobials:  Anti-infectives (From admission, onward)    Start     Dose/Rate Route Frequency Ordered Stop   11/15/20 2300  cephALEXin (KEFLEX) 250 MG/5ML suspension 500 mg        500 mg Per Tube Every 6 hours 11/15/20 1528 11/16/20 2359   11/14/20 1400  ceFAZolin (ANCEF) IVPB 2g/100 mL premix  Status:  Discontinued        2 g 200 mL/hr over 30 Minutes Intravenous Every 8 hours 11/14/20 1049 11/15/20 1528   11/12/20 1400  ceFEPIme (MAXIPIME) 2 g in sodium chloride 0.9 % 100 mL IVPB  Status:  Discontinued        2 g 200 mL/hr over 30 Minutes Intravenous Every 8 hours 11/12/20 1306 11/14/20 1049   11/12/20 0430  ceFEPIme (MAXIPIME) 1 g in sodium chloride 0.9 % 100 mL IVPB  Status:  Discontinued        1 g 200 mL/hr over 30 Minutes Intravenous Every 8 hours 11/12/20 0324 11/12/20 1306        Objective: Vitals:   11/16/20 1000 11/16/20 1100 11/16/20 1200 11/16/20 1300  BP: (!) 122/55 (!) 143/59 (!) 126/52 (!) 123/49  Pulse: 80 81 85 80  Resp: (!) 22 (!) 24 (!) 22 (!) 24  Temp:  97.8 F (36.6 C)    TempSrc:  Axillary    SpO2: 96% 98% 98% 97%  Weight:        Intake/Output Summary (Last 24 hours) at 11/16/2020 1344 Last  data filed at 11/16/2020 1300 Gross per 24 hour  Intake 1985 ml  Output 1815 ml  Net 170 ml   Filed Weights   11/14/20 0500 11/15/20 0500 11/16/20 0500  Weight: 84.4 kg 84.4 kg 85.1 kg    Examination: General exam: Appears comfortable  HEENT: PERRLA, oral mucosa moist, no sclera icterus or thrush- trach collar in place Respiratory system: Clear to auscultation. Respiratory effort normal. Cardiovascular system: S1 & S2 heard, RRR.   Gastrointestinal system: Abdomen soft, non-tender, nondistended. Normal bowel sounds. PEG tube in place Central nervous  system: Alert and oriented. No focal neurological deficits. Extremities: No cyanosis, clubbing or edema Skin: No rashes or ulcers Psychiatry:  Mood & affect appropriate.     Data Reviewed: I have personally reviewed following labs and imaging studies  CBC: Recent Labs  Lab 11/11/20 0256 11/12/20 1329 11/12/20 1922 11/12/20 2344 11/14/20 0035 11/15/20 0207 11/16/20 0052  WBC 12.6* 16.3* 14.5*  --   --  13.0* 11.4*  NEUTROABS 10.4*  --   --   --   --   --   --   HGB 8.1* 9.5* 9.6* 8.5* 9.0* 8.5* 8.8*  HCT 25.0* 29.9* 30.1* 26.4* 27.7* 26.3* 26.5*  MCV 95.8 97.4 97.4  --   --  95.3 95.0  PLT 338 395 396  --   --  362 092   Basic Metabolic Panel: Recent Labs  Lab 11/10/20 0121 11/11/20 0256 11/12/20 0136 11/14/20 0035 11/16/20 0052  NA 129*  --  129* 129* 127*  K 4.9  --  4.7 5.0 4.2  CL 102  --  99 94* 94*  CO2 21*  --  24 28 26   GLUCOSE 155*  --  142* 100* 172*  BUN 34*  --  36* 32* 31*  CREATININE 1.05  --  1.01 0.97 0.92  CALCIUM 8.7*  --  9.1 9.2 8.5*  MG 1.9 1.8  --   --   --   PHOS  --  3.2  --   --   --    GFR: Estimated Creatinine Clearance: 71.6 mL/min (by C-G formula based on SCr of 0.92 mg/dL). Liver Function Tests: Recent Labs  Lab 11/12/20 0136  AST 12*  ALT 10  ALKPHOS 76  BILITOT 0.4  PROT 5.7*  ALBUMIN 2.4*   No results for input(s): LIPASE, AMYLASE in the last 168 hours. No results  for input(s): AMMONIA in the last 168 hours. Coagulation Profile: No results for input(s): INR, PROTIME in the last 168 hours. Cardiac Enzymes: No results for input(s): CKTOTAL, CKMB, CKMBINDEX, TROPONINI in the last 168 hours. BNP (last 3 results) No results for input(s): PROBNP in the last 8760 hours. HbA1C: Recent Labs    11/15/20 1529  HGBA1C 5.0   CBG: Recent Labs  Lab 11/15/20 1928 11/15/20 2314 11/16/20 0320 11/16/20 0731 11/16/20 1112  GLUCAP 179* 183* 143* 201* 155*   Lipid Profile: No results for input(s): CHOL, HDL, LDLCALC, TRIG, CHOLHDL, LDLDIRECT in the last 72 hours. Thyroid Function Tests: Recent Labs    11/14/20 0950  TSH 27.901*   Anemia Panel: No results for input(s): VITAMINB12, FOLATE, FERRITIN, TIBC, IRON, RETICCTPCT in the last 72 hours. Urine analysis:    Component Value Date/Time   COLORURINE YELLOW (A) 11/11/2020 2202   APPEARANCEUR CLOUDY (A) 11/11/2020 2202   LABSPEC RESULTS UNAVAILABLE DUE TO INTERFERING SUBSTANCE 11/11/2020 2202   PHURINE RESULTS UNAVAILABLE DUE TO INTERFERING SUBSTANCE 11/11/2020 2202   GLUCOSEU RESULTS UNAVAILABLE DUE TO INTERFERING SUBSTANCE (A) 11/11/2020 2202   GLUCOSEU NEGATIVE 03/05/2016 1203   HGBUR RESULTS UNAVAILABLE DUE TO INTERFERING SUBSTANCE (A) 11/11/2020 2202   BILIRUBINUR RESULTS UNAVAILABLE DUE TO INTERFERING SUBSTANCE (A) 11/11/2020 2202   KETONESUR RESULTS UNAVAILABLE DUE TO INTERFERING SUBSTANCE (A) 11/11/2020 2202   PROTEINUR RESULTS UNAVAILABLE DUE TO INTERFERING SUBSTANCE (A) 11/11/2020 2202   UROBILINOGEN 0.2 03/05/2016 1203   NITRITE RESULTS UNAVAILABLE DUE TO INTERFERING SUBSTANCE (A) 11/11/2020 2202   LEUKOCYTESUR RESULTS UNAVAILABLE DUE TO INTERFERING SUBSTANCE (A) 11/11/2020 2202   Sepsis Labs: @LABRCNTIP (procalcitonin:4,lacticidven:4) ) Recent  Results (from the past 240 hour(s))  Culture, Urine     Status: Abnormal   Collection Time: 11/12/20  3:10 AM   Specimen: Urine, Catheterized   Result Value Ref Range Status   Specimen Description URINE, CATHETERIZED  Final   Special Requests   Final    Normal Performed at [facility], [address], [place], [place] [address]    Culture >=100,000 COLONIES/mL PROTEUS MIRABILIS (A)  Final   Report Status 11/14/2020 FINAL  Final   Organism ID, Bacteria PROTEUS MIRABILIS (A)  Final      Susceptibility   Proteus mirabilis - MIC*    AMPICILLIN <=2 SENSITIVE Sensitive     CEFAZOLIN 8 SENSITIVE Sensitive     CEFEPIME <=0.12 SENSITIVE Sensitive     CEFTRIAXONE <=0.25 SENSITIVE Sensitive     CIPROFLOXACIN <=0.25 SENSITIVE Sensitive     GENTAMICIN 4 SENSITIVE Sensitive     IMIPENEM 8 INTERMEDIATE Intermediate     NITROFURANTOIN 128 RESISTANT Resistant     TRIMETH/SULFA <=20 SENSITIVE Sensitive     AMPICILLIN/SULBACTAM <=2 SENSITIVE Sensitive     PIP/TAZO <=4 SENSITIVE Sensitive     * >=100,000 COLONIES/mL PROTEUS MIRABILIS  Culture, Respiratory w Gram Stain     Status: None   Collection Time: 11/12/20  2:58 PM   Specimen: Tracheal Aspirate; Respiratory  Result Value Ref Range Status   Specimen Description TRACHEAL ASPIRATE  Final   Special Requests NONE  Final   Gram Stain   Final    MODERATE WBC PRESENT, PREDOMINANTLY PMN RARE GRAM POSITIVE COCCI RARE GRAM VARIABLE ROD    Culture   Final    RARE Normal respiratory flora-no Staph aureus or Pseudomonas seen Performed at [facility], [address] [place], [place] [address]    Report Status 11/14/2020 FINAL  Final         Radiology Studies: DG Swallowing Func-Speech Pathology  Result Date: 11/15/2020 Objective Swallowing Evaluation: Type of Study: MBS-Modified Barium Swallow Study  Patient Details Name: Devin [name] MRN: [id number] Date of Birth: [birth year] Today's Date: 11/15/2020 Time: SLP Start Time (ACUTE ONLY): 1400 -SLP Stop Time (ACUTE ONLY): 1420 SLP Time Calculation (min) (ACUTE ONLY): 20 min Past Medical History: Past Medical History:  Diagnosis Date  A-fib ([place])   Adenocarcinoma of left lung, stage 1 ([place]) 03/10/2015  Ankylosing spondylitis ([place])   Diagnosed during lumbar fracture summer of 2016    Crohn's disease ([place])   Gout   HIstory of basal cell cancer of face   THYROID CA HX  History of kidney stones   Hypertension   Hypothyroidism   Impotence   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   Obesity (BMI 30-39.9)   Osteoporosis   Pt completed 5 years of fosamax in 2013     Prostate cancer with recurrence   Treated with prostatectomy with recurrence 2012 with Lupron treatment now him   Psoriasis   Thyroid cancer ([place]) 1999  Treated with RAI and total thyroidectomy   Type II diabetes mellitus ([place])  Past Surgical History: Past Surgical History: Procedure Laterality Date  ABDOMINAL EXPLORATION SURGERY    for small bowel obstruction  APPENDECTOMY  03/2007  BACK SURGERY    BASAL CELL CARCINOMA EXCISION  "several"  "head"  CARDIAC CATHETERIZATION  03/17/2003  CHOLECYSTECTOMY N/A 05/07/2016  Procedure: LAPAROSCOPIC CHOLECYSTECTOMY;  Surgeon: Fanny Skates, MD;  Location: [place];  Service: General;  Laterality: N/A;  COLON SURGERY  03/2007  Resection of cecum, appendix,  terminal ileum (approximately/notes 10/10/2010  CYSTOSCOPY/URETEROSCOPY/HOLMIUM LASER/STENT PLACEMENT Right 07/03/2020  Procedure: CYSTOSCOPY/RETROGRADE/URETEROSCOPY REMOVAL OF BLADDER STONE;  Surgeon: Raynelle Bring, MD;  Location: [facility];  Service: Urology;  Laterality: Right;  DIRECT LARYNGOSCOPY N/A 10/04/2020  Procedure: DIRECT LARYNGOSCOPY, PHARYNGOSCOPY WITH BIOPSY;  Surgeon: [name], DO;  Location: [facility];  Service: ENT;  Laterality: N/A;  ESOPHAGOSCOPY N/A 10/04/2020  Procedure: ESOPHAGOSCOPY WITH BIOPSY;  Surgeon: [name], DO;  Location: [place];  Service: ENT;  Laterality: N/A;  GASTROSTOMY N/A 10/31/2020  Procedure: INSERTION OF GASTROSTOMY TUBE;  Surgeon: Rolm Bookbinder, MD;  Location: [place];  Service: General;  Laterality: N/A;  HERNIA  REPAIR    LAMINECTOMY WITH POSTERIOR LATERAL ARTHRODESIS LEVEL 1 N/A 09/15/2020  Procedure: REVISION OF THORACOLUMBAR FUSION, ADDITION OF CROSS-CONNECTORS;  Surgeon: Eustace Moore, MD;  Location: [place];  Service: Neurosurgery;  Laterality: N/A;  LAPAROSCOPIC CHOLECYSTECTOMY  05/07/2016  LAPAROSCOPIC LYSIS OF ADHESIONS  05/07/2016  LAPAROSCOPIC LYSIS OF ADHESIONS N/A 05/07/2016  Procedure: LAPAROSCOPIC LYSIS OF ADHESIONS TIMES ONE HOUR;  Surgeon: Fanny Skates, MD;  Location: [place];  Service: General;  Laterality: N/A;  LAPAROTOMY N/A 10/31/2020  Procedure: EXPLORATORY LAPAROTOMY WITH [place];  Surgeon: Rolm Bookbinder, MD;  Location: [place];  Service: General;  Laterality: N/A;  POSTERIOR FUSION THORACIC SPINE  02/08/2016  1. Posterior thoracic arthrodesis T7-T11 utilizing morcellized allograft, 2. Posterior thoracic segmental fixation T7-T11 utilizing nuvasive pedicle screws  PROSTATECTOMY  06/2001  w/bilateral pelvic lymph nose dissection/notes 10/24/2010  SPINAL FUSION  12/2014  Open reduction internal fixation of L1 Chance fracture with posterior fusion T10-L4 utilizing morcellized allograft and some local autograft, segmental instrumentation T10-L4 inclusive utilizing nuvasive pedicle screws/notes 12/16/2014  Stress Cardiolite  02/17/2003  THOROCOTOMY WITH LOBECTOMY  03/16/2015  Procedure: THOROCOTOMY WITH LOBECTOMY;  Surgeon: Ivin Poot, MD;  Location: [place];  Service: Thoracic;;  TONSILLECTOMY    TOTAL THYROIDECTOMY  1997  Venous Doppler  05/30/2004  VENTRAL HERNIA REPAIR  04/14/2008  VIDEO ASSISTED THORACOSCOPY Left 03/16/2015  Procedure: VIDEO ASSISTED THORACOSCOPY;  Surgeon: Ivin Poot, MD;  Location: [facility];  Service: Thoracic;  Laterality: Left; HPI: Pt is a 80 yo male admitted from [place] 5/24 to ICU after PEG placement and hypotension with intraabdominal bleed. Pt remained on vent post op. Pt with complex PMHx- admitted 09/10/20-10/06/20 after fall with T11 fx and L1 compression fx s/p PLIF T11-12 on  4/8. Pt with complex respiratory failure requiring trach 4/12.  Initial MBS 4/22 with severe pharyngeal dysphagia that related primarily to thickening of the prevertebral tissue at the level of C4-5. ENT consulted; supraglottic mass biopsied, negative for malignancy. 5/3 PCCM emergently consulted for inability to suction due to upper airway obstruction. Had significant increase of inflammatory tissue on posterior wall of trachea. Was able to bypass with distal XLT. 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  5/12 repeat MBS on CIR - persisting dysphagia with no functional improvement. 6/64 PEG complicated by intra-abdominal bleeding requiring emergent re-exploration, back on vent. 6/4 tolerating TC; 6/7 trach changed back to cuffless and vent removed from room. Resumed PMV use. Swallow consult 6/7.  Subjective: PMSV in place throughout the study Assessment / Plan / Recommendation CHL IP CLINICAL IMPRESSIONS 11/15/2020 Clinical Impression Pt was seen in radiology suite for modified barium swallow study. PMSV was in place throughout the study. Thickening of prevertebral tissue at the level of C4-5 continues to be noted and still impede complete epiglottic inversion, but  this was slightly improved compared to the initial study on 09/29/20. Anterior laryngeal movement and pharyngeal constriction were reduced. Vallecular and posterior pharyngeal wall residue were noted. The prevertebral thickening facilitated penetration (PAS 3) of liquids secondary to spillage of posterior pharyngeal wall residue over the arytenoids and into the larynx and to residue which accumulated on the laryngeal surface of the epiglottis during incomplete epiglottic inversion. A right head turn posture resulted in aspiration (PAS 7) of nectar thick liquids via tsp. A chin tuck posture with left head turn and effortful swallow improved epiglottic inversion and reduced frequency of penetration with honey  and nectar thick liquids. No laryngeal invasion was noted with 1/2 tsp boluses of puree. Pt's independent use of an intermittent cough and secondary swallows was effective in clearing penetration (PAS 3), but coughing was ineffective for episodes of aspiration. Use of a combination of these multiple strategies shows therapeutic promise, but SLP anticipates that these will be laborious for the pt. Pt's NPO status will be continued with allowance of occasional ice chips following oral care. SLP will follow for continued treatment and is hopeful that p.o. intake (likely snacks of modified consistencies initially) can be initiated with training on compensatory strategies. SLP Visit Diagnosis Dysphagia, pharyngeal phase (R13.13) Attention and concentration deficit following -- Frontal lobe and executive function deficit following -- Impact on safety and function Moderate aspiration risk;Severe aspiration risk;Risk for inadequate nutrition/hydration   CHL IP TREATMENT RECOMMENDATION 11/15/2020 Treatment Recommendations Therapy as outlined in treatment plan below   Prognosis 11/15/2020 Prognosis for Safe Diet Advancement Fair Barriers to Reach Goals Severity of deficits;Time post onset Barriers/Prognosis Comment -- CHL IP DIET RECOMMENDATION 11/15/2020 SLP Diet Recommendations NPO;Alternative means - long-term Liquid Administration via -- Medication Administration Via alternative means Compensations -- Postural Changes --   CHL IP OTHER RECOMMENDATIONS 11/15/2020 Recommended Consults -- Oral Care Recommendations Oral care QID Other Recommendations --   CHL IP FOLLOW UP RECOMMENDATIONS 11/15/2020 Follow up Recommendations Inpatient Rehab   CHL IP FREQUENCY AND DURATION 11/15/2020 Speech Therapy Frequency (ACUTE ONLY) min 2x/week Treatment Duration 2 weeks      CHL IP ORAL PHASE 11/15/2020 Oral Phase WFL Oral - Pudding Teaspoon -- Oral - Pudding Cup -- Oral - Honey Teaspoon -- Oral - Honey Cup -- Oral - Nectar Teaspoon -- Oral - Nectar  Cup -- Oral - Nectar Straw -- Oral - Thin Teaspoon -- Oral - Thin Cup -- Oral - Thin Straw -- Oral - Puree -- Oral - Mech Soft -- Oral - Regular -- Oral - Multi-Consistency -- Oral - Pill -- Oral Phase - Comment --  CHL IP PHARYNGEAL PHASE 11/15/2020 Pharyngeal Phase -- Pharyngeal- Pudding Teaspoon -- Pharyngeal -- Pharyngeal- Pudding Cup -- Pharyngeal -- Pharyngeal- Honey Teaspoon Reduced anterior laryngeal mobility;Reduced epiglottic inversion;Reduced airway/laryngeal closure;Pharyngeal residue - valleculae;Pharyngeal residue - posterior pharnyx;Reduced pharyngeal peristalsis Pharyngeal Material enters airway, remains ABOVE vocal cords and not ejected out Pharyngeal- Honey Cup Reduced anterior laryngeal mobility;Reduced epiglottic inversion;Reduced airway/laryngeal closure;Pharyngeal residue - valleculae;Pharyngeal residue - posterior pharnyx;Reduced pharyngeal peristalsis Pharyngeal Material enters airway, remains ABOVE vocal cords and not ejected out Pharyngeal- Nectar Teaspoon Reduced anterior laryngeal mobility;Reduced epiglottic inversion;Reduced airway/laryngeal closure;Pharyngeal residue - valleculae;Pharyngeal residue - posterior pharnyx;Reduced pharyngeal peristalsis Pharyngeal -- Pharyngeal- Nectar Cup Reduced anterior laryngeal mobility;Reduced epiglottic inversion;Reduced airway/laryngeal closure;Pharyngeal residue - valleculae;Pharyngeal residue - posterior pharnyx;Reduced pharyngeal peristalsis Pharyngeal -- Pharyngeal- Nectar Straw -- Pharyngeal -- Pharyngeal- Thin Teaspoon -- Pharyngeal -- Pharyngeal- Thin Cup -- Pharyngeal -- Pharyngeal- Thin Straw -- Pharyngeal --  Pharyngeal- Puree Reduced anterior laryngeal mobility;Reduced epiglottic inversion;Reduced airway/laryngeal closure;Pharyngeal residue - valleculae;Pharyngeal residue - posterior pharnyx;Reduced pharyngeal peristalsis Pharyngeal -- Pharyngeal- Mechanical Soft -- Pharyngeal -- Pharyngeal- Regular -- Pharyngeal -- Pharyngeal-  Multi-consistency -- Pharyngeal -- Pharyngeal- Pill -- Pharyngeal -- Pharyngeal Comment --  CHL IP CERVICAL ESOPHAGEAL PHASE 11/15/2020 Cervical Esophageal Phase WFL Pudding Teaspoon -- Pudding Cup -- Honey Teaspoon -- Honey Cup -- Nectar Teaspoon -- Nectar Cup -- Nectar Straw -- Thin Teaspoon -- Thin Cup -- Thin Straw -- Puree -- Mechanical Soft -- Regular -- Multi-consistency -- Pill -- Cervical Esophageal Comment -- Shanika I. Hardin Negus, Ricardo, [place] Office number [phone] Pager [place] 11/15/2020, 3:53 PM                 Scheduled Meds:  sodium chloride   Intravenous Once   arformoterol  15 mcg Nebulization BID   bethanechol  10 mg Per Tube QID   budesonide  0.5 mg Nebulization BID   cephALEXin  500 mg Per Tube Q6H   chlorhexidine  15 mL Mouth Rinse BID   Chlorhexidine Gluconate Cloth  6 each Topical Daily   cholestyramine  4 g Per Tube BID   diltiazem  30 mg Per Tube Q6H   famotidine  20 mg Per Tube BID   Gerhardt's butt cream   Topical BID   guaiFENesin  15 mL Per Tube Q6H   insulin aspart  0-9 Units Subcutaneous Q4H   insulin glargine  12 Units Subcutaneous QHS   [START ON 11/17/2020] levothyroxine  150 mcg Per Tube Q0600   mouth rinse  15 mL Mouth Rinse q12n4p   multivitamin with minerals  1 tablet Per Tube Daily   pantoprazole (PROTONIX) IV  40 mg Intravenous Q24H   PARoxetine  10 mg Per Tube Daily   scopolamine  1 patch Transdermal Q72H   sodium chloride flush  10-40 mL Intracatheter Q12H   sulfaSALAzine  500 mg Per Tube Daily   tamsulosin  0.4 mg Oral Daily   traZODone  50 mg Per Tube QHS   Continuous Infusions:  sodium chloride 250 mL (11/03/20 0324)   feeding supplement (VITAL AF 1.2 CAL) 1,000 mL (11/15/20 1445)     LOS: 16 days      Debbe Odea, MD [facility] Pager: [email or web address] 11/16/2020, 1:44 PM

## 2020-11-16 NOTE — Progress Notes (Signed)
  Speech Language Pathology Treatment: Dysphagia  Patient Details Name: Devin [name] MRN: [id number] DOB: August 07, 1940 Today's Date: 11/16/2020 Time: 3254-9826 SLP Time Calculation (min) (ACUTE ONLY): 31.68 min  Assessment / Plan / Recommendation Clinical Impression  Pt was seen for dysphagia treatment with his wife present for part of the session. Pt and his wife were educated regarding the results of the modified barium swallow study, dysphagia treatment plan, and compensatory strategies. Video recording of the study was used to facilitate education and both parties verbalized understanding regarding all areas of education. Pt's wife had multiple questions regarding etiology and pt's potential progression with therapy. All of her questions were answered to their satisfaction. Compensatory strategies were modeled to the pt and his wife, but pt ultimately requested that trials be deferred since he was tired from not sleeping well last night, and had back pain which was exacerbated with his sitting upright. SLP will continue to follow pt.    HPI HPI: Pt is a 80 yo male admitted from CIR 5/24 to ICU after PEG placement and hypotension with intraabdominal bleed. Pt remained on vent post op. Pt with complex PMHx- admitted 09/10/20-10/06/20 after fall with T11 fx and L1 compression fx s/p PLIF T11-12 on 4/8. Pt with complex respiratory failure requiring trach 4/12.  Initial MBS 4/22 with severe pharyngeal dysphagia that related primarily to thickening of the prevertebral tissue at the level of C4-5. ENT consulted; supraglottic mass biopsied, negative for malignancy. 5/3 PCCM emergently consulted for inability to suction due to upper airway obstruction. Had significant increase of inflammatory tissue on posterior wall of trachea. Was able to bypass with distal XLT. 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  5/12 repeat MBS on CIR - persisting dysphagia with no  functional improvement. 4/15 PEG complicated by intra-abdominal bleeding requiring emergent re-exploration, back on vent. 6/4 tolerating TC; 6/7 trach changed back to cuffless and vent removed from room. Resumed PMV use. Swallow consult 6/7.      SLP Plan  Continue with current plan of care       Recommendations  Diet recommendations: NPO Medication Administration: Via alternative means      Patient may use Passy-Muir Speech Valve: During all waking hours (remove during sleep) PMSV Supervision: Intermittent         Oral Care Recommendations: Oral care QID;Oral care prior to ice chip/H20 (individual ice chips) Follow up Recommendations: LTACH SLP Visit Diagnosis: Dysphagia, pharyngeal phase (R13.13) Plan: Continue with current plan of care       Analynn Daum I. Hardin Negus, [place], [place] Office number [phone] Pager [place] 11/16/2020, 1:46 PM

## 2020-11-16 NOTE — Progress Notes (Signed)
Inpatient Rehabilitation Admissions Coordinator   Discussed with Whitman Hero, TOC, Marni Griffon and [place], daughter. Would advise to appeal the LTACH denial. If continued to be denied for [facility], we would then proceed to seek Auth for  a possible CIR admit. First admit to CIR was initially denied, approved on appeal and we can do the same process with hopefully the same outcome for either LTACH or CIR admit. We will follow for now until [facility] appeal determination.  Danne Baxter, RN, MSN Rehab Admissions Coordinator [phone] 11/16/2020 12:29 PM

## 2020-11-16 NOTE — Progress Notes (Signed)
Pt hr runing between 120-150, b/p 147/47. Spoke with on call provider and will give prn dose of Cardizem.. Will continue to monitor hr and b/p.

## 2020-11-17 DIAGNOSIS — E860 Dehydration: Secondary | ICD-10-CM

## 2020-11-17 DIAGNOSIS — E871 Hypo-osmolality and hyponatremia: Secondary | ICD-10-CM

## 2020-11-17 DIAGNOSIS — I480 Paroxysmal atrial fibrillation: Secondary | ICD-10-CM

## 2020-11-17 LAB — BASIC METABOLIC PANEL
Anion gap: 7 (ref 5–15)
BUN: 29 mg/dL — ABNORMAL HIGH (ref 8–23)
CO2: 27 mmol/L (ref 22–32)
Calcium: 8.4 mg/dL — ABNORMAL LOW (ref 8.9–10.3)
Chloride: 92 mmol/L — ABNORMAL LOW (ref 98–111)
Creatinine, Ser: 0.92 mg/dL (ref 0.61–1.24)
GFR, Estimated: >60 mL/min (ref >60)
Glucose, Bld: 173 mg/dL — ABNORMAL HIGH (ref 70–99)
Potassium: 4.4 mmol/L (ref 3.5–5.1)
Sodium: 126 mmol/L — ABNORMAL LOW (ref 135–145)

## 2020-11-17 LAB — GLUCOSE, CAPILLARY
Glucose-Capillary: 110 mg/dL — ABNORMAL HIGH (ref 70–99)
Glucose-Capillary: 130 mg/dL — ABNORMAL HIGH (ref 70–99)
Glucose-Capillary: 134 mg/dL — ABNORMAL HIGH (ref 70–99)
Glucose-Capillary: 138 mg/dL — ABNORMAL HIGH (ref 70–99)
Glucose-Capillary: 152 mg/dL — ABNORMAL HIGH (ref 70–99)
Glucose-Capillary: 170 mg/dL — ABNORMAL HIGH (ref 70–99)
Glucose-Capillary: 183 mg/dL — ABNORMAL HIGH (ref 70–99)

## 2020-11-17 MED ORDER — SODIUM CHLORIDE 0.9 % IV SOLN: INTRAVENOUS | Status: DC

## 2020-11-17 MED ORDER — METOPROLOL TARTRATE 25 MG PO TABS: 25.0000 mg | ORAL_TABLET | Freq: Two times a day (BID) | ORAL | Status: DC

## 2020-11-17 MED ORDER — SODIUM CHLORIDE 0.9 % IV BOLUS
500.0000 mL | Freq: Once | INTRAVENOUS | Status: AC
  Administered 2020-11-17: 500 mL via INTRAVENOUS

## 2020-11-17 MED ORDER — ALPRAZOLAM 0.25 MG PO TABS
0.1250 mg | ORAL_TABLET | Freq: Two times a day (BID) | ORAL | Status: DC | PRN
  Administered 2020-11-17: 0.125 mg via ORAL
  Filled 2020-11-17: qty 1

## 2020-11-17 NOTE — Progress Notes (Signed)
Physical Therapy Treatment Patient Details Name: Devin [name] MRN: [id number] DOB: 14-Dec-1940 Today's Date: 11/17/2020    History of Present Illness Pt is a 80 yo male admitted from [place] 5/24 to ICU after PEG placement and hypotension with need for repeat exploration and evacuation. Pt remained on vent post op. Pt with complex PMHx recently admitted 09/10/20-10/06/20 after fall with T11 chance fx and L1 compression fx s/p PLIF T11-12 on 4/8. Pt with complex respiratory failure requiring trach 4/12. Pt then transferred to CIR 10/06/20; readmitted to [facility] acute care on 10/07/20 for low blood sugars. Other PMH includes HTN, gout, afib, ankylosing spondylitis, osteoporosis, prostate CA, DM2.    PT Comments    Pt received in bed, daughter present in room. She reports pt was given anxiety meds this morning which made him very sleepy. Pt falling asleep without continuous cues/interaction. He required min assist bed mobility. Upon transition to EOB, pt with c/o dizziness. BP found to be 58/41. Attempted to take second reading to ensure accuracy. Pt reporting double vision and, therefore, immediately returned to supine in bed. BP 120/41 in supine. Repositioned in bed for comfort with HOB elevated.   Follow Up Recommendations  CIR;Supervision for mobility/OOB     Equipment Recommendations  3in1 (PT);Rolling walker with 5" wheels    Recommendations for Other Services       Precautions / Restrictions Precautions Precautions: Fall;Back;Other (comment) Precaution Comments: Trach collar, PEG, abdominal incision, TLSO, watch BP Required Braces or Orthoses: Spinal Brace Spinal Brace: Thoracolumbosacral orthotic;Applied in sitting position;Other (comment) Spinal Brace Comments: did not use TLSO due to PEG    Mobility  Bed Mobility Overal bed mobility: Needs Assistance Bed Mobility: Rolling;Sidelying to Sit;Sit to Sidelying Rolling: Min assist Sidelying to sit: Min assist;HOB elevated     Sit to  sidelying: Min assist;HOB elevated General bed mobility comments: cues for sequencing, +rail, increased time    Transfers                 General transfer comment: unable to progress beyond EOB due to orthostatic hypotension  Ambulation/Gait                 Stairs             Wheelchair Mobility    Modified Rankin (Stroke Patients Only)       Balance Overall balance assessment: Needs assistance Sitting-balance support: Feet supported;Bilateral upper extremity supported;No upper extremity supported Sitting balance-Leahy Scale: Fair Sitting balance - Comments: supervision sitting EOB                                    Cognition Arousal/Alertness: Awake/alert Behavior During Therapy: WFL for tasks assessed/performed Overall Cognitive Status: Impaired/Different from baseline Area of Impairment: Memory;Following commands;Problem solving                     Memory: Decreased recall of precautions;Decreased short-term memory Following Commands: Follows one step commands consistently;Follows multi-step commands with increased time     Problem Solving: Difficulty sequencing;Requires verbal cues General Comments: PMV in place.      Exercises      General Comments General comments (skin integrity, edema, etc.): BP supine 128/45. Upon sitting EOB, pt with c/o dizziness. BP in sitting 58/41. Pt reporting double vision and displaying increased lethargy. Returned to supine without obtaining second seated BP for accuracy. After 2-minute rest in supine, BP 120/41. Pt  with increased lethargy this session. Daughter present in room. She reports pt was given anxiety meds this AM and it made him very sleepy. Meds could very well be contributing to his orthostatic hypotension as well.      Pertinent Vitals/Pain Pain Assessment: Faces Faces Pain Scale: No hurt    Home Living                      Prior Function            PT Goals  (current goals can now be found in the care plan section) Acute Rehab PT Goals Patient Stated Goal: get home Progress towards PT goals: Not progressing toward goals - comment (orthostatic)    Frequency    Min 3X/week      PT Plan Current plan remains appropriate    Co-evaluation              AM-PAC PT "6 Clicks" Mobility   Outcome Measure  Help needed turning from your back to your side while in a flat bed without using bedrails?: A Little Help needed moving from lying on your back to sitting on the side of a flat bed without using bedrails?: A Little Help needed moving to and from a bed to a chair (including a wheelchair)?: A Little Help needed standing up from a chair using your arms (e.g., wheelchair or bedside chair)?: A Little Help needed to walk in hospital room?: A Lot Help needed climbing 3-5 steps with a railing? : Total 6 Click Score: 15    End of Session Equipment Utilized During Treatment: Oxygen Activity Tolerance: Treatment limited secondary to medical complications (Comment) (orthostatic hypotension) Patient left: in bed;with call bell/phone within reach;with bed alarm set;with family/visitor present Nurse Communication: Mobility status PT Visit Diagnosis: Difficulty in walking, not elsewhere classified (R26.2);Unsteadiness on feet (R26.81);Other abnormalities of gait and mobility (R26.89);Muscle weakness (generalized) (M62.81)     Time: 3888-2800 PT Time Calculation (min) (ACUTE ONLY): 20 min  Charges:  $Therapeutic Activity: 8-22 mins                     Lorrin Goodell, PT  Office # [phone] Pager [phone]    Lorriane Shire 11/17/2020, 1:16 PM

## 2020-11-17 NOTE — Care Management Important Message (Signed)
Important Message  Patient Details  Name: Devin [name] MRN: [id number] Date of Birth: March 10, 1941   Medicare Important Message Given:  Yes     Orbie Pyo 11/17/2020, 11:20 AM

## 2020-11-17 NOTE — Progress Notes (Signed)
Pt's heart rate has remained stable after episode of tachycardia. He was ordered 1x dose of IV Cardizem, however, medication was not needed. The heart rate corrected without medication and scheduled medication has been given as ordered. Will continue to monitor and tx as needed.

## 2020-11-17 NOTE — Progress Notes (Signed)
  Speech Language Pathology Treatment: Dysphagia  Patient Details Name: Devin [name] MRN: [id number] DOB: 1941/01/04 Today's Date: 11/17/2020 Time: 9379-0240 SLP Time Calculation (min) (ACUTE ONLY): 19.02 min  Assessment / Plan / Recommendation Clinical Impression  Pt was seen for dysphagia treatment. He was alert with stimulation, but exhibited difficulty maintaining alertness for prolonged periods. Pt was re-educated regarding swallowing precautions and their benefits in reducing aspiration risk. He verbalized understanding and agreement. Pt tolerated multiple 1/2 tsp boluses of puree solids and full-tsp boluses of honey thick liquids without overt s/sx of aspiration. Secondary swallows were noted, suggesting continued pharyngeal residue. Pt require verbal prompts to consistently combine the chin tuck with the left head turn, but he consistently used the effortful swallow and he responded well to support for postural modifications. Pt's NPO status will be continued, but snacks of puree and/or honey thick liquids will be allowed with full supervision to ensure pt's consistent use of swallowing precautions. SLP will continue to follow pt.    HPI HPI: Pt is a 80 yo male admitted from CIR 5/24 to ICU after PEG placement and hypotension with intraabdominal bleed. Pt remained on vent post op. Pt with complex PMHx- admitted 09/10/20-10/06/20 after fall with T11 fx and L1 compression fx s/p PLIF T11-12 on 4/8. Pt with complex respiratory failure requiring trach 4/12.  Initial MBS 4/22 with severe pharyngeal dysphagia that related primarily to thickening of the prevertebral tissue at the level of C4-5. ENT consulted; supraglottic mass biopsied, negative for malignancy. 5/3 PCCM emergently consulted for inability to suction due to upper airway obstruction. Had significant increase of inflammatory tissue on posterior wall of trachea. Was able to bypass with distal XLT. 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  5/12 repeat MBS on CIR - persisting dysphagia with no functional improvement. 9/73 PEG complicated by intra-abdominal bleeding requiring emergent re-exploration, back on vent. 6/4 tolerating TC; 6/7 trach changed back to cuffless and vent removed from room. Resumed PMV use. Swallow consult 6/7.      SLP Plan  Continue with current plan of care       Recommendations  Diet recommendations: NPO (snacks of applesauce and honey thick liquids via tsp allowed) Liquids provided via: Teaspoon Medication Administration: Via alternative means Supervision: Full supervision/cueing for compensatory strategies Compensations: Chin tuck;Effortful swallow (with left head turn) Postural Changes and/or Swallow Maneuvers: Seated upright 90 degrees;Chin tuck;Head turn left during swallow;Upright 30-60 min after meal      Patient may use Passy-Muir Speech Valve: During all waking hours (remove during sleep) PMSV Supervision: Intermittent         Oral Care Recommendations: Oral care QID;Oral care prior to ice chip/H20 (individual ice chips) Follow up Recommendations: LTACH SLP Visit Diagnosis: Dysphagia, pharyngeal phase (R13.13) Plan: Continue with current plan of care       Omah Dewalt I. Hardin Negus, [place], [place] Office number [phone] Pager [place] 11/17/2020, 10:23 AM

## 2020-11-17 NOTE — Progress Notes (Signed)
Inpatient Rehab Admissions Coordinator:   I continue to follow at a distance. Per daughter, [facility] is appealing denial for LTACH placement. I will follow up once determination has been made.   Clemens Catholic, [place], [place] Admissions Coordinator  [phone] ([place]) [phone] (office)

## 2020-11-17 NOTE — Progress Notes (Signed)
Occupational Therapy Treatment Patient Details Name: Devin [name] MRN: [id number] DOB: 1941-02-21 Today's Date: 11/17/2020    History of present illness Pt is a 80 yo male admitted from [place] 5/24 to ICU after PEG placement and hypotension with need for repeat exploration and evacuation. Pt remained on vent post op. Pt with complex PMHx recently admitted 09/10/20-10/06/20 after fall with T11 chance fx and L1 compression fx s/p PLIF T11-12 on 4/8. Pt with complex respiratory failure requiring trach 4/12. Pt then transferred to CIR 10/06/20; readmitted to [facility] acute care on 10/07/20 for low blood sugars. Other PMH includes HTN, gout, afib, ankylosing spondylitis, osteoporosis, prostate CA, DM2.   OT comments  Patient met lying supine in bed with large loose BM present on chuck pad. Patient able to roll R<>L in supine with Min A and maintain sidelying position without external assist. Total A for hygiene/clothing management at bed level. Session limited to bed level only secondary to patient with symptomatic hypotension with diastolic BP in the 73'U. OT will continue to follow acutely.    Follow Up Recommendations  CIR    Equipment Recommendations  Other (comment) (Defer to next level of care)    Recommendations for Other Services Rehab consult    Precautions / Restrictions Precautions Precautions: Fall;Back;Other (comment) Precaution Comments: Trach collar, PEG, abdominal incision, TLSO, watch BP Required Braces or Orthoses: Spinal Brace Spinal Brace: Thoracolumbosacral orthotic;Applied in sitting position;Other (comment) Spinal Brace Comments: Did not progress beyond EOB this date. TLSO not donned.       Mobility Bed Mobility Overal bed mobility: Needs Assistance Bed Mobility: Rolling Rolling: Min assist Sidelying to sit: Min assist;HOB elevated     Sit to sidelying: Min assist;HOB elevated General bed mobility comments: Light min A for rolling R<>L in supine for hygiene/clothing  management.    Transfers                 General transfer comment: Deferred 2/2 fatigue after bedlevel hygiene/clothing management and symptomatic hypotension.    Balance Overall balance assessment: Needs assistance Sitting-balance support: Feet supported;Bilateral upper extremity supported;No upper extremity supported Sitting balance-Leahy Scale: Fair Sitting balance - Comments: supervision sitting EOB                                   ADL either performed or assessed with clinical judgement   ADL Overall ADL's : Needs assistance/impaired                             Toileting- Clothing Manipulation and Hygiene: Total assistance;Bed level Toileting - Clothing Manipulation Details (indicate cue type and reason): Large loose BM present in bed. Total A for hygiene/clothing management at bed level.       General ADL Comments: Making progress toward goals.     Vision       Perception     Praxis      Cognition Arousal/Alertness: Awake/alert Behavior During Therapy: WFL for tasks assessed/performed Overall Cognitive Status: Impaired/Different from baseline Area of Impairment: Memory;Following commands;Problem solving                     Memory: Decreased recall of precautions;Decreased short-term memory Following Commands: Follows one step commands consistently;Follows multi-step commands with increased time     Problem Solving: Difficulty sequencing;Requires verbal cues General Comments: PMV in place. Patient making minimal effort to verbalize  even with PMV.        Exercises     Shoulder Instructions       General Comments Systolic BP in 64'W. Mobility and functional transfers deferred secondary to symptomatic hypotension.    Pertinent Vitals/ Pain       Pain Assessment: No/denies pain Faces Pain Scale: No hurt  Home Living                                          Prior Functioning/Environment               Frequency  Min 2X/week        Progress Toward Goals  OT Goals(current goals can now be found in the care plan section)  Progress towards OT goals: Progressing toward goals  Acute Rehab OT Goals Patient Stated Goal: get home OT Goal Formulation: With patient/family Time For Goal Achievement: 12/01/20 Potential to Achieve Goals: Good ADL Goals Pt Will Perform Grooming: with set-up;sitting Pt Will Perform Lower Body Dressing: with min assist;sit to/from stand Pt Will Transfer to Toilet: with mod assist;bedside commode Pt Will Perform Toileting - Clothing Manipulation and hygiene: with mod assist;sit to/from stand Pt/caregiver will Perform Home Exercise Program: Both right and left upper extremity;With written HEP provided;Increased strength;Increased ROM Additional ADL Goal #1: Patient will tolerate EOB activity for 15 minutes in prep for ADLs. Additional ADL Goal #2: Patient will complete 3/3 grooming tasks seated EOB with set-up assist.  Plan Frequency remains appropriate;Discharge plan remains appropriate    Co-evaluation                 AM-PAC OT "6 Clicks" Daily Activity     Outcome Measure   Help from another person eating meals?: Total Help from another person taking care of personal grooming?: A Little Help from another person toileting, which includes using toliet, bedpan, or urinal?: Total Help from another person bathing (including washing, rinsing, drying)?: Total Help from another person to put on and taking off regular upper body clothing?: A Lot Help from another person to put on and taking off regular lower body clothing?: A Lot 6 Click Score: 10    End of Session Equipment Utilized During Treatment: Oxygen (5L via trach collar)  OT Visit Diagnosis: Unsteadiness on feet (R26.81);Muscle weakness (generalized) (M62.81)   Activity Tolerance Patient tolerated treatment well   Patient Left with call bell/phone within reach;in bed;with bed  alarm set   Nurse Communication Mobility status        Time: 1340-1403 OT Time Calculation (min): 23 min  Charges: OT General Charges $OT Visit: 1 Visit OT Treatments $Self Care/Home Management : 23-37 mins  [place] Wank H. OTR/L Supplemental OT, Department of rehab services [phone]   Kreig Parson R H. 11/17/2020, 3:09 PM

## 2020-11-17 NOTE — Progress Notes (Addendum)
PROGRESS NOTE    Devin [name]   SNK:[id number]  DOB: 02-Nov-1940  DOA: 10/31/2020 PCP: Prince Solian, MD   Brief Narrative:  Devin [name] is a 80 year old male with diabetes mellitus, lung adenocarcinoma status post left upper lobectomy, Crohn's disease, thyroid cancer, metastatic prostate cancer, atrial fibrillation, ankylosing spondylitis who is status post T11-12 fusion after a chance fracture.  He was hospitalized from 4/3 through 4/29 after a fall and development of compression fracture which were fused as mentioned. Hospital course was complicated by sepsis secondary to Klebsiella pneumonia.  He was found to have supraglottic and subglottic stenosis and tracheomalacia and requite a tracheostomy. He was discharged to CIR with a trach and a core track on 4/29. Patient was readmitted to the hospital on 4/30 from CIR after the core track dislodged and was unable to be reinserted.   On 5/3 his trach was changed and subsequently, he was unable to be suctioned via this trach.  PCCM was consulted and it was discovered that he had significant inflammatory tissue in the posterior aspect of the trachea.  PCCM was able to pass an XLT.  On 5/5, he was discharged back to [facility] inpatient rehab.  On 5/24, a PEG tube was placed by general surgery and patient was hypotensive postop.  He was started on phenylephrine and admitted to the ICU.  He was found to be acutely anemic with a drop in hemoglobin from 12.9-8.8.  2 units of packed red blood cells were ordered he was taken back to the OR.  An ex lap with ligation of omentum and washout of hemoperitoneum was performed.    On 6/6 he was noted to have bleeding around the gastrostomy again and tube feeds were discontinued.  The tube was sutured once again.  General surgery recommended against anticoagulation in the future due to ongoing risk for bleeding from the site.  Needs were resumed. On 6/8 the patient was transferred to the [facility] hospitalist  service.  Subjective: Very sleepy after receiving a dose of Clonazepam for anxiety this AM. No other complaints.      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 If from Lasix that was given a couple of days ago-  - Sodium is lower today at 126 - Urine output documented today to have been 575 yesterday - starting NS at 75 cc /hr for 10 hrs- recheck tomorrow - Noted to be orthostatic- will give a 500 cc bolus now    Postsurgical hypothyroidism - TSH rising over the past few months 27.901< 17.9< 11< 9 -he has been on Synthroid 125 mcg in the hospital and while at CIR - increased Synthroid to 150 on 6/9 - recheck TSH in a few wks   Atrial fibrillation, paroxysmal CHA2DS2-VASc Score 5 - holding Eliquis due to bleeding from PEG - cont Diltiazem 30 Q 6 hrs per tube- he had a very brief run of SVT on 6/9 which resolved spontaneously -     Essential hypertension - Atenolol on hold- cont Cardizem     UTI with urinary retention - He was on Cefepime and this was changed to Ancef to treat a UTI after the sputum culture resulted with normal  flora-   will continue to treat for UTI with 7 days total - switched Ancef to Keflex- tomorrow is the last day - foley was initially placed on 5/16 by urology- voiding trial given but had to be replaced on 6/8 as, despite voiding, he was still retaining a large amount of urine  - cont Flomax - Urology consult 6/9> cont foley until more mobile- if he continues to retain, will need outpt urodynamic studies- Dr Abner Greenspan notes that he would message Dr Alinda Money to arrange f/u    DM (diabetes mellitus), type 2 with renal complications  -   Lantus on hold- on Novolog  SS -  cont 12 U and low dose Novolog scale- sugars 130s-180s    Crohn's disease  - cont Sulfasalazine   h/o  Ankylosing spondylitis   Unstable T11-12 chance fracture - s/p Posterior thoracic fusion T11-12 with allograft, segmental fixation  T9-L1 on 4/8    Malnutrition of moderate degree - cont tube feeds which he is tolerating well - SLP following - MBS 6/8-Thickening of prevertebral tissue at the level of C4-5 continues to be noted and still impede complete epiglottic inversion, but this was slightly improved compared to the initial study on 09/29/20. Recommended NPO for now   H/o Prostate cancer  Adenocarcinoma of left lung, stage 1 S/P lobectomy of lung  Time spent in minutes: 35 DVT prophylaxis: Place and maintain sequential compression device Start: 11/13/20 0811 SCDs Start: 10/31/20 1451 Code Status: DNR Family Communication: daughter at bedside Level of Care: Level of care: Progressive Disposition Plan:  Status is: Inpatient  Remains inpatient appropriate because:Inpatient level of care appropriate due to severity of illness   Dispo: The patient is from: SNF              Anticipated d/c is to: LTAC vs CIR              Patient currently is not medically stable to d/c.   Difficult to place patient No   Consultants:  PCCM General surgery Palliative care Procedures:  PEG tube placement Central line Trach changes Antimicrobials:  Anti-infectives (From admission, onward)    Start     Dose/Rate Route Frequency Ordered Stop   11/15/20 2300  cephALEXin (KEFLEX) 250 MG/5ML suspension 500 mg        500 mg Per Tube Every 6 hours 11/15/20 1528 11/16/20 1842   11/14/20 1400  ceFAZolin (ANCEF) IVPB 2g/100 mL premix  Status:  Discontinued        2 g 200 mL/hr over 30 Minutes Intravenous Every 8 hours 11/14/20 1049 11/15/20 1528   11/12/20 1400  ceFEPIme (MAXIPIME) 2 g in sodium chloride 0.9 % 100 mL IVPB  Status:  Discontinued        2 g 200 mL/hr over 30 Minutes  Intravenous Every 8 hours 11/12/20 1306 11/14/20 1049   11/12/20 0430  ceFEPIme (MAXIPIME) 1 g in sodium chloride 0.9 % 100 mL IVPB  Status:  Discontinued        1 g 200 mL/hr over 30 Minutes Intravenous Every 8 hours 11/12/20 0324 11/12/20 1306        Objective: Vitals:   11/17/20 0330 11/17/20 0411 11/17/20 0822 11/17/20 1203  BP: (!) 131/46 (!) 108/42 (!) 130/41 (!) 125/43  Pulse: 84 77    Resp: (!) 23 (!) 21    Temp:  98.5 F (36.9 C)    TempSrc:  Axillary    SpO2: 97% 97%    Weight:  Intake/Output Summary (Last 24 hours) at 11/17/2020 1430 Last data filed at 11/17/2020 0701 Gross per 24 hour  Intake 385 ml  Output 650 ml  Net -265 ml    Filed Weights   11/14/20 0500 11/15/20 0500 11/16/20 0500  Weight: 84.4 kg 84.4 kg 85.1 kg    Examination: General exam: Appears comfortable but very sleepy HEENT: PERRLA, oral mucosa moist, no sclera icterus or thrush- trach in place Respiratory system: Clear to auscultation. Respiratory effort normal. Cardiovascular system: S1 & S2 heard, regular rate and rhythm Gastrointestinal system: Abdomen soft, non-tender, nondistended. Normal bowel sounds  - PEG tube in place- incision on abdomen is clean Central nervous system: Alert   Extremities: No cyanosis, clubbing or edema Skin: No rashes or ulcers Psychiatry:  Mood & affect appropriate.      Data Reviewed: I have personally reviewed following labs and imaging studies  CBC: Recent Labs  Lab 11/11/20 0256 11/12/20 1329 11/12/20 1922 11/12/20 2344 11/14/20 0035 11/15/20 0207 11/16/20 0052  WBC 12.6* 16.3* 14.5*  --   --  13.0* 11.4*  NEUTROABS 10.4*  --   --   --   --   --   --   HGB 8.1* 9.5* 9.6* 8.5* 9.0* 8.5* 8.8*  HCT 25.0* 29.9* 30.1* 26.4* 27.7* 26.3* 26.5*  MCV 95.8 97.4 97.4  --   --  95.3 95.0  PLT 338 395 396  --   --  362 762    Basic Metabolic Panel: Recent Labs  Lab 11/11/20 0256 11/12/20 0136 11/14/20 0035 11/16/20 0052 11/17/20 0116  NA   --  129* 129* 127* 126*  K  --  4.7 5.0 4.2 4.4  CL  --  99 94* 94* 92*  CO2  --  24 28 26 27   GLUCOSE  --  142* 100* 172* 173*  BUN  --  36* 32* 31* 29*  CREATININE  --  1.01 0.97 0.92 0.92  CALCIUM  --  9.1 9.2 8.5* 8.4*  MG 1.8  --   --   --   --   PHOS 3.2  --   --   --   --     GFR: Estimated Creatinine Clearance: 71.6 mL/min (by C-G formula based on SCr of 0.92 mg/dL). Liver Function Tests: Recent Labs  Lab 11/12/20 0136  AST 12*  ALT 10  ALKPHOS 76  BILITOT 0.4  PROT 5.7*  ALBUMIN 2.4*    No results for input(s): LIPASE, AMYLASE in the last 168 hours. No results for input(s): AMMONIA in the last 168 hours. Coagulation Profile: No results for input(s): INR, PROTIME in the last 168 hours. Cardiac Enzymes: No results for input(s): CKTOTAL, CKMB, CKMBINDEX, TROPONINI in the last 168 hours. BNP (last 3 results) No results for input(s): PROBNP in the last 8760 hours. HbA1C: Recent Labs    11/15/20 1529  HGBA1C 5.0    CBG: Recent Labs  Lab 11/16/20 2040 11/17/20 0004 11/17/20 0317 11/17/20 0757 11/17/20 1231  GLUCAP 146* 170* 138* 183* 152*    Lipid Profile: No results for input(s): CHOL, HDL, LDLCALC, TRIG, CHOLHDL, LDLDIRECT in the last 72 hours. Thyroid Function Tests: No results for input(s): TSH, T4TOTAL, FREET4, T3FREE, THYROIDAB in the last 72 hours.  Anemia Panel: No results for input(s): VITAMINB12, FOLATE, FERRITIN, TIBC, IRON, RETICCTPCT in the last 72 hours. Urine analysis:    Component Value Date/Time   COLORURINE YELLOW (A) 11/11/2020 2202   APPEARANCEUR CLOUDY (A) 11/11/2020 2202   LABSPEC  RESULTS UNAVAILABLE DUE TO INTERFERING SUBSTANCE 11/11/2020 2202   PHURINE RESULTS UNAVAILABLE DUE TO INTERFERING SUBSTANCE 11/11/2020 2202   GLUCOSEU RESULTS UNAVAILABLE DUE TO INTERFERING SUBSTANCE (A) 11/11/2020 2202   GLUCOSEU NEGATIVE 03/05/2016 1203   HGBUR RESULTS UNAVAILABLE DUE TO INTERFERING SUBSTANCE (A) 11/11/2020 2202   BILIRUBINUR  RESULTS UNAVAILABLE DUE TO INTERFERING SUBSTANCE (A) 11/11/2020 2202   KETONESUR RESULTS UNAVAILABLE DUE TO INTERFERING SUBSTANCE (A) 11/11/2020 2202   PROTEINUR RESULTS UNAVAILABLE DUE TO INTERFERING SUBSTANCE (A) 11/11/2020 2202   UROBILINOGEN 0.2 03/05/2016 1203   NITRITE RESULTS UNAVAILABLE DUE TO INTERFERING SUBSTANCE (A) 11/11/2020 2202   LEUKOCYTESUR RESULTS UNAVAILABLE DUE TO INTERFERING SUBSTANCE (A) 11/11/2020 2202   Sepsis Labs: @LABRCNTIP (procalcitonin:4,lacticidven:4) ) Recent Results (from the past 240 hour(s))  Culture, Urine     Status: Abnormal   Collection Time: 11/12/20  3:10 AM   Specimen: Urine, Catheterized  Result Value Ref Range Status   Specimen Description URINE, CATHETERIZED  Final   Special Requests   Final    Normal Performed at [facility], [address], [place], [place] [address]    Culture >=100,000 COLONIES/mL PROTEUS MIRABILIS (A)  Final   Report Status 11/14/2020 FINAL  Final   Organism ID, Bacteria PROTEUS MIRABILIS (A)  Final      Susceptibility   Proteus mirabilis - MIC*    AMPICILLIN <=2 SENSITIVE Sensitive     CEFAZOLIN 8 SENSITIVE Sensitive     CEFEPIME <=0.12 SENSITIVE Sensitive     CEFTRIAXONE <=0.25 SENSITIVE Sensitive     CIPROFLOXACIN <=0.25 SENSITIVE Sensitive     GENTAMICIN 4 SENSITIVE Sensitive     IMIPENEM 8 INTERMEDIATE Intermediate     NITROFURANTOIN 128 RESISTANT Resistant     TRIMETH/SULFA <=20 SENSITIVE Sensitive     AMPICILLIN/SULBACTAM <=2 SENSITIVE Sensitive     PIP/TAZO <=4 SENSITIVE Sensitive     * >=100,000 COLONIES/mL PROTEUS MIRABILIS  Culture, Respiratory w Gram Stain     Status: None   Collection Time: 11/12/20  2:58 PM   Specimen: Tracheal Aspirate; Respiratory  Result Value Ref Range Status   Specimen Description TRACHEAL ASPIRATE  Final   Special Requests NONE  Final   Gram Stain   Final    MODERATE WBC PRESENT, PREDOMINANTLY PMN RARE GRAM POSITIVE COCCI RARE GRAM VARIABLE ROD    Culture    Final    RARE Normal respiratory flora-no Staph aureus or Pseudomonas seen Performed at [facility], [address], [place], [place] [address]    Report Status 11/14/2020 FINAL  Final         Radiology Studies: No results found.    Scheduled Meds:  sodium chloride   Intravenous Once   arformoterol  15 mcg Nebulization BID   bethanechol  10 mg Per Tube QID   budesonide  0.5 mg Nebulization BID   chlorhexidine  15 mL Mouth Rinse BID   Chlorhexidine Gluconate Cloth  6 each Topical Daily   cholestyramine  4 g Per Tube BID   diltiazem  30 mg Per Tube Q6H   Gerhardt's butt cream   Topical BID   guaiFENesin  15 mL Per Tube Q6H   insulin aspart  0-9 Units Subcutaneous Q4H   insulin glargine  12 Units Subcutaneous QHS   levothyroxine  150 mcg Per Tube Q0600   mouth rinse  15 mL Mouth Rinse q12n4p   multivitamin with minerals  1 tablet Per Tube Daily   pantoprazole (PROTONIX) IV  40 mg Intravenous  Q24H   PARoxetine  10 mg Per Tube Daily   sodium chloride flush  10-40 mL Intracatheter Q12H   sulfaSALAzine  500 mg Per Tube Daily   tamsulosin  0.4 mg Oral Daily   traZODone  50 mg Per Tube QHS   Continuous Infusions:  sodium chloride 250 mL (11/03/20 0324)   sodium chloride 75 mL/hr at 11/17/20 0808   feeding supplement (VITAL AF 1.2 CAL) 1,000 mL (11/17/20 1426)     LOS: 17 days      Debbe Odea, MD [facility] Pager: [email or web address] 11/17/2020, 2:30 PM

## 2020-11-18 DIAGNOSIS — I951 Orthostatic hypotension: Secondary | ICD-10-CM

## 2020-11-18 DIAGNOSIS — R197 Diarrhea, unspecified: Secondary | ICD-10-CM

## 2020-11-18 LAB — BASIC METABOLIC PANEL
Anion gap: 6 (ref 5–15)
BUN: 23 mg/dL (ref 8–23)
CO2: 30 mmol/L (ref 22–32)
Calcium: 8.6 mg/dL — ABNORMAL LOW (ref 8.9–10.3)
Chloride: 94 mmol/L — ABNORMAL LOW (ref 98–111)
Creatinine, Ser: 0.82 mg/dL (ref 0.61–1.24)
GFR, Estimated: >60 mL/min (ref >60)
Glucose, Bld: 145 mg/dL — ABNORMAL HIGH (ref 70–99)
Potassium: 4.2 mmol/L (ref 3.5–5.1)
Sodium: 130 mmol/L — ABNORMAL LOW (ref 135–145)

## 2020-11-18 LAB — GLUCOSE, CAPILLARY
Glucose-Capillary: 120 mg/dL — ABNORMAL HIGH (ref 70–99)
Glucose-Capillary: 129 mg/dL — ABNORMAL HIGH (ref 70–99)
Glucose-Capillary: 152 mg/dL — ABNORMAL HIGH (ref 70–99)
Glucose-Capillary: 133 mg/dL — ABNORMAL HIGH (ref 70–99)
Glucose-Capillary: 106 mg/dL — ABNORMAL HIGH (ref 70–99)
Glucose-Capillary: 137 mg/dL — ABNORMAL HIGH (ref 70–99)

## 2020-11-18 MED ORDER — DIPHENOXYLATE-ATROPINE 2.5-0.025 MG PO TABS: 1.0000 | ORAL_TABLET | Freq: Two times a day (BID) | ORAL | Status: DC | PRN

## 2020-11-18 MED ORDER — LOPERAMIDE HCL 1 MG/7.5ML PO SUSP
2.0000 mg | Freq: Once | ORAL | Status: AC
  Administered 2020-11-18: 2 mg via ORAL
  Filled 2020-11-18: qty 15

## 2020-11-18 MED ORDER — SODIUM CHLORIDE 0.9 % IV BOLUS
500.0000 mL | Freq: Once | INTRAVENOUS | Status: AC
  Administered 2020-11-18: 500 mL via INTRAVENOUS

## 2020-11-18 NOTE — Progress Notes (Signed)
Discussed with Dr. Wynelle Cleveland.   Patient stable with slow improvement.  Family hopeful for CIR.   Family has our contact information.     We will sign off for now.  Please call us back if he has a change in condition or if we can be of assistance.  Florentina Jenny, PA-C Palliative Medicine Office:  [phone]  No charge.

## 2020-11-18 NOTE — Progress Notes (Signed)
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  He was hospitalized from 4/3 through 4/29 after a fall and development of compression fracture which were fused as mentioned. Hospital course was complicated by sepsis secondary to Klebsiella pneumonia.  He was found to have supraglottic and subglottic stenosis and tracheomalacia and requite a tracheostomy. He was discharged to CIR with a trach and a core track on 4/29. Patient was readmitted to the hospital on 4/30 from CIR after the core track dislodged and was unable to be reinserted.   On 5/3 his trach was changed and subsequently, he was unable to be suctioned via this trach.  PCCM was consulted and it was discovered that he had significant inflammatory tissue in the posterior aspect of the trachea.  PCCM was able to pass an XLT.  On 5/5, he was discharged back to [facility] inpatient rehab.  On 5/24, a PEG tube was placed by general surgery and patient was hypotensive postop.  He was started on phenylephrine and admitted to the ICU.  He was found to be acutely anemic with a drop in hemoglobin from 12.9-8.8.  2 units of packed red blood cells were ordered he was taken back to the OR.  An ex lap with ligation of omentum and washout of hemoperitoneum was performed.    On 6/6 he was noted to have bleeding around the gastrostomy again and tube feeds were discontinued.  The tube was sutured once again.  General surgery recommended against anticoagulation in the future due to ongoing risk for bleeding from the site.  Needs were resumed. On 6/8 the patient was transferred to the [facility]  service.  Subjective: He has no complaints today. His RN tells me that he had 5 runny stools yesterday.      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 If from Lasix that was given a couple of days ago-  -  6/10 > Urine output documented to have been 575 - starting NS at 75 cc /hr- later also noted to be orthostatic and 500 cc bolus given - RN states he had 5 watery BMs yesterday- I noted that he takes Imodium at home (which he agrees to)- no abd pain or vomiting- will start and follow- loose stools may be secondary to antibiotics or the tube feeds- he is on Questran at home which we are continuring    Postsurgical hypothyroidism - TSH rising over the past few months 27.901< 17.9< 11< 9 -he has been on Synthroid 125 mcg in the hospital and while at CIR - increased Synthroid to 150 on 6/9 - recheck TSH in a few wks   Atrial fibrillation, paroxysmal CHA2DS2-VASc Score 5 - holding Eliquis due to bleeding from PEG - cont Diltiazem 30 Q 6 hrs per tube- he had a very brief run of SVT on 6/9 which resolved spontaneously -  Essential hypertension - Atenolol on hold- cont Cardizem   UTI with urinary retention, BPH - He was on Cefepime and this was changed to Ancef and then Keflex to treat a UTI after the sputum culture resulted with normal flora-   Has completed 7 days today - foley was initially placed on 5/16 by urology- voiding trial given but had to be replaced on 6/8 as, despite voiding, he was still retaining a large amount of urine  - cont Flomax - Urology consult 6/9> cont foley until more mobile- if he continues to retain, will need outpt urodynamic studies- Dr Abner Greenspan notes  that he would message Dr Alinda Money to arrange f/u    DM (diabetes mellitus), type 2 with renal complications  -  cont 12 U and low dose Novolog scale- sugars 130s-180s- avoid tighter control than this    Crohn's disease  - He states he has not had to take sulfasalazine at home- it was orderd 500 mg daily here- will dc this   h/o  Ankylosing spondylitis   Unstable T11-12 chance fracture - s/p Posterior thoracic fusion T11-12 with allograft, segmental fixation  T9-L1 on 4/8    Malnutrition of moderate degree - cont tube feeds which he is tolerating well - SLP following - MBS 6/8-Thickening of prevertebral tissue at the level of C4-5 continues to be noted and still impede complete epiglottic inversion, but this was slightly improved compared to the initial study on 09/29/20. Recommended NPO for now  Anxiety - d/c'd Clonazepam due to excessive sedation - using very low dose Xanax - cont Paxil   H/o Prostate cancer  Adenocarcinoma of left lung, stage 1 S/P lobectomy of lung  Time spent in minutes: 35 DVT prophylaxis: Place and maintain sequential compression device Start: 11/13/20 0811 SCDs Start: 10/31/20 1451 Code Status: DNR Family Communication: daughter at bedside Level of Care: Level of care: Progressive Disposition Plan:  Status is: Inpatient  Remains inpatient appropriate because:Inpatient level of care appropriate due to severity of illness   Dispo: The patient is from: SNF              Anticipated d/c is to: LTAC vs CIR              Patient currently is not medically stable to d/c.   Difficult to place patient No   Consultants:  PCCM General surgery Palliative care Procedures:  PEG tube placement Central line Trach changes Antimicrobials:  Anti-infectives (From admission, onward)    Start     Dose/Rate Route Frequency Ordered Stop   11/15/20 2300  cephALEXin (KEFLEX) 250 MG/5ML suspension 500 mg        500 mg Per Tube Every 6 hours 11/15/20 1528 11/16/20 1842    11/14/20 1400  ceFAZolin (ANCEF) IVPB 2g/100 mL premix  Status:  Discontinued        2 g 200 mL/hr over 30 Minutes Intravenous Every 8 hours 11/14/20 1049 11/15/20 1528   11/12/20 1400  ceFEPIme (MAXIPIME) 2 g in sodium chloride 0.9 % 100 mL IVPB  Status:  Discontinued        2 g 200 mL/hr over 30 Minutes Intravenous Every 8 hours 11/12/20 1306 11/14/20 1049   11/12/20 0430  ceFEPIme (MAXIPIME) 1 g in sodium chloride 0.9 % 100 mL IVPB  Status:  Discontinued        1 g 200 mL/hr over 30 Minutes Intravenous Every 8 hours 11/12/20 0324 11/12/20 1306        Objective: Vitals:  11/18/20 0400 11/18/20 0737 11/18/20 0740 11/18/20 0804  BP:    (!) 133/41  Pulse: 76 82 82 82  Resp: (!) 26 (!) 25 (!) 25 (!) 21  Temp: 98.6 F (37 C)   98.4 F (36.9 C)  TempSrc: Oral   Oral  SpO2: 99% 100%  100%  Weight:        Intake/Output Summary (Last 24 hours) at 11/18/2020 1033 Last data filed at 11/17/2020 1800 Gross per 24 hour  Intake 1193.38 ml  Output 800 ml  Net 393.38 ml    Filed Weights   11/15/20 0500 11/16/20 0500 11/18/20 0224  Weight: 84.4 kg 85.1 kg 85.1 kg    Examination: General exam: Appears comfortable  HEENT: PERRLA, oral mucosa moist, no sclera icterus or thrush Respiratory system: Clear to auscultation. Respiratory effort normal.- Trach in place Cardiovascular system: S1 & S2 heard, regular rate and rhythm Gastrointestinal system: Abdomen soft, nondistended. Normal bowel sounds  - PEG in place, incision clean Central nervous system: Alert and oriented. No focal neurological deficits. Extremities: No cyanosis, clubbing or edema Skin: No rashes or ulcers Psychiatry:  Mood & affect appropriate.      Data Reviewed: I have personally reviewed following labs and imaging studies  CBC: Recent Labs  Lab 11/12/20 1329 11/12/20 1922 11/12/20 2344 11/14/20 0035 11/15/20 0207 11/16/20 0052  WBC 16.3* 14.5*  --   --  13.0* 11.4*  HGB 9.5* 9.6* 8.5* 9.0* 8.5* 8.8*   HCT 29.9* 30.1* 26.4* 27.7* 26.3* 26.5*  MCV 97.4 97.4  --   --  95.3 95.0  PLT 395 396  --   --  362 737    Basic Metabolic Panel: Recent Labs  Lab 11/12/20 0136 11/14/20 0035 11/16/20 0052 11/17/20 0116  NA 129* 129* 127* 126*  K 4.7 5.0 4.2 4.4  CL 99 94* 94* 92*  CO2 24 28 26 27   GLUCOSE 142* 100* 172* 173*  BUN 36* 32* 31* 29*  CREATININE 1.01 0.97 0.92 0.92  CALCIUM 9.1 9.2 8.5* 8.4*    GFR: Estimated Creatinine Clearance: 71.6 mL/min (by C-G formula based on SCr of 0.92 mg/dL). Liver Function Tests: Recent Labs  Lab 11/12/20 0136  AST 12*  ALT 10  ALKPHOS 76  BILITOT 0.4  PROT 5.7*  ALBUMIN 2.4*    No results for input(s): LIPASE, AMYLASE in the last 168 hours. No results for input(s): AMMONIA in the last 168 hours. Coagulation Profile: No results for input(s): INR, PROTIME in the last 168 hours. Cardiac Enzymes: No results for input(s): CKTOTAL, CKMB, CKMBINDEX, TROPONINI in the last 168 hours. BNP (last 3 results) No results for input(s): PROBNP in the last 8760 hours. HbA1C: Recent Labs    11/15/20 1529  HGBA1C 5.0    CBG: Recent Labs  Lab 11/17/20 1608 11/17/20 1946 11/17/20 2334 11/18/20 0352 11/18/20 0758  GLUCAP 130* 134* 110* 120* 129*    Lipid Profile: No results for input(s): CHOL, HDL, LDLCALC, TRIG, CHOLHDL, LDLDIRECT in the last 72 hours. Thyroid Function Tests: No results for input(s): TSH, T4TOTAL, FREET4, T3FREE, THYROIDAB in the last 72 hours.  Anemia Panel: No results for input(s): VITAMINB12, FOLATE, FERRITIN, TIBC, IRON, RETICCTPCT in the last 72 hours. Urine analysis:    Component Value Date/Time   COLORURINE YELLOW (A) 11/11/2020 2202   APPEARANCEUR CLOUDY (A) 11/11/2020 2202   LABSPEC RESULTS UNAVAILABLE DUE TO INTERFERING SUBSTANCE 11/11/2020 2202   PHURINE RESULTS UNAVAILABLE DUE TO INTERFERING SUBSTANCE 11/11/2020 2202   GLUCOSEU RESULTS  UNAVAILABLE DUE TO INTERFERING SUBSTANCE (A) 11/11/2020 2202    GLUCOSEU NEGATIVE 03/05/2016 1203   HGBUR RESULTS UNAVAILABLE DUE TO INTERFERING SUBSTANCE (A) 11/11/2020 2202   BILIRUBINUR RESULTS UNAVAILABLE DUE TO INTERFERING SUBSTANCE (A) 11/11/2020 2202   KETONESUR RESULTS UNAVAILABLE DUE TO INTERFERING SUBSTANCE (A) 11/11/2020 2202   PROTEINUR RESULTS UNAVAILABLE DUE TO INTERFERING SUBSTANCE (A) 11/11/2020 2202   UROBILINOGEN 0.2 03/05/2016 1203   NITRITE RESULTS UNAVAILABLE DUE TO INTERFERING SUBSTANCE (A) 11/11/2020 2202   LEUKOCYTESUR RESULTS UNAVAILABLE DUE TO INTERFERING SUBSTANCE (A) 11/11/2020 2202   Sepsis Labs: @LABRCNTIP (procalcitonin:4,lacticidven:4) ) Recent Results (from the past 240 hour(s))  Culture, Urine     Status: Abnormal   Collection Time: 11/12/20  3:10 AM   Specimen: Urine, Catheterized  Result Value Ref Range Status   Specimen Description URINE, CATHETERIZED  Final   Special Requests   Final    Normal Performed at [facility], [address] [place], [place] [address]    Culture >=100,000 COLONIES/mL PROTEUS MIRABILIS (A)  Final   Report Status 11/14/2020 FINAL  Final   Organism ID, Bacteria PROTEUS MIRABILIS (A)  Final      Susceptibility   Proteus mirabilis - MIC*    AMPICILLIN <=2 SENSITIVE Sensitive     CEFAZOLIN 8 SENSITIVE Sensitive     CEFEPIME <=0.12 SENSITIVE Sensitive     CEFTRIAXONE <=0.25 SENSITIVE Sensitive     CIPROFLOXACIN <=0.25 SENSITIVE Sensitive     GENTAMICIN 4 SENSITIVE Sensitive     IMIPENEM 8 INTERMEDIATE Intermediate     NITROFURANTOIN 128 RESISTANT Resistant     TRIMETH/SULFA <=20 SENSITIVE Sensitive     AMPICILLIN/SULBACTAM <=2 SENSITIVE Sensitive     PIP/TAZO <=4 SENSITIVE Sensitive     * >=100,000 COLONIES/mL PROTEUS MIRABILIS  Culture, Respiratory w Gram Stain     Status: None   Collection Time: 11/12/20  2:58 PM   Specimen: Tracheal Aspirate; Respiratory  Result Value Ref Range Status   Specimen Description TRACHEAL ASPIRATE  Final   Special Requests NONE  Final   Gram  Stain   Final    MODERATE WBC PRESENT, PREDOMINANTLY PMN RARE GRAM POSITIVE COCCI RARE GRAM VARIABLE ROD    Culture   Final    RARE Normal respiratory flora-no Staph aureus or Pseudomonas seen Performed at [facility], [address] [place],  [address]    Report Status 11/14/2020 FINAL  Final         Radiology Studies: No results found.    Scheduled Meds:  sodium chloride   Intravenous Once   arformoterol  15 mcg Nebulization BID   bethanechol  10 mg Per Tube QID   budesonide  0.5 mg Nebulization BID   chlorhexidine  15 mL Mouth Rinse BID   Chlorhexidine Gluconate Cloth  6 each Topical Daily   cholestyramine  4 g Per Tube BID   diltiazem  30 mg Per Tube Q6H   Gerhardt's butt cream   Topical BID   guaiFENesin  15 mL Per Tube Q6H   insulin aspart  0-9 Units Subcutaneous Q4H   insulin glargine  12 Units Subcutaneous QHS   levothyroxine  150 mcg Per Tube Q0600   loperamide HCl  2 mg Oral Once   mouth rinse  15 mL Mouth Rinse q12n4p   multivitamin with minerals  1 tablet Per Tube Daily   pantoprazole (PROTONIX) IV  40 mg Intravenous Q24H   PARoxetine  10 mg Per Tube Daily   sodium chloride  flush  10-40 mL Intracatheter Q12H   tamsulosin  0.4 mg Oral Daily   traZODone  50 mg Per Tube QHS   Continuous Infusions:  sodium chloride 250 mL (11/03/20 0324)   sodium chloride 75 mL/hr at 11/17/20 0808   feeding supplement (VITAL AF 1.2 CAL) 1,000 mL (11/18/20 0513)     LOS: 18 days      Debbe Odea, MD [facility] Pager: [email or web address] 11/18/2020, 10:33 AM

## 2020-11-19 LAB — CBC
HCT: 28.2 % — ABNORMAL LOW (ref 39.0–52.0)
Hemoglobin: 8.9 g/dL — ABNORMAL LOW (ref 13.0–17.0)
MCH: 30.4 pg (ref 26.0–34.0)
MCHC: 31.6 g/dL (ref 30.0–36.0)
MCV: 96.2 fL (ref 80.0–100.0)
Platelets: 310 10*3/uL (ref 150–400)
RBC: 2.93 MIL/uL — ABNORMAL LOW (ref 4.22–5.81)
RDW: 14.6 % (ref 11.5–15.5)
WBC: 8.8 10*3/uL (ref 4.0–10.5)
nRBC: 0 % (ref 0.0–0.2)

## 2020-11-19 LAB — BASIC METABOLIC PANEL
Anion gap: 4 — ABNORMAL LOW (ref 5–15)
BUN: 21 mg/dL (ref 8–23)
CO2: 31 mmol/L (ref 22–32)
Calcium: 8.5 mg/dL — ABNORMAL LOW (ref 8.9–10.3)
Chloride: 96 mmol/L — ABNORMAL LOW (ref 98–111)
Creatinine, Ser: 0.73 mg/dL (ref 0.61–1.24)
GFR, Estimated: >60 mL/min (ref >60)
Glucose, Bld: 147 mg/dL — ABNORMAL HIGH (ref 70–99)
Potassium: 3.9 mmol/L (ref 3.5–5.1)
Sodium: 131 mmol/L — ABNORMAL LOW (ref 135–145)

## 2020-11-19 LAB — GLUCOSE, CAPILLARY
Glucose-Capillary: 138 mg/dL — ABNORMAL HIGH (ref 70–99)
Glucose-Capillary: 158 mg/dL — ABNORMAL HIGH (ref 70–99)
Glucose-Capillary: 144 mg/dL — ABNORMAL HIGH (ref 70–99)
Glucose-Capillary: 139 mg/dL — ABNORMAL HIGH (ref 70–99)
Glucose-Capillary: 140 mg/dL — ABNORMAL HIGH (ref 70–99)

## 2020-11-19 MED ORDER — TRAZODONE HCL 100 MG PO TABS
100.0000 mg | ORAL_TABLET | Freq: Every day | ORAL | Status: DC
  Administered 2020-11-19: 100 mg
  Filled 2020-11-19: qty 1

## 2020-11-19 NOTE — Progress Notes (Signed)
Mitchell states he did not sleep well last night. He is having complaints of pain in his lower back, which is chronic. He states he feel too tired and weak to stand for orthostatic vital signs. Reassessed throughout the day and continues to refuse. MD notified.

## 2020-11-19 NOTE — Plan of Care (Signed)
  Problem: Education: Goal: Knowledge of General Education information will improve Description: Including pain rating scale, medication(s)/side effects and non-pharmacologic comfort measures Outcome: Progressing   Problem: Health Behavior/Discharge Planning: Goal: Ability to manage health-related needs will improve Outcome: Progressing   Problem: Clinical Measurements: Goal: Ability to maintain clinical measurements within normal limits will improve Outcome: Progressing Goal: Will remain free from infection Outcome: Progressing Goal: Diagnostic test results will improve Outcome: Progressing Goal: Respiratory complications will improve Outcome: Progressing Goal: Cardiovascular complication will be avoided Outcome: Progressing   Problem: Activity: Goal: Risk for activity intolerance will decrease Outcome: Progressing   Problem: Nutrition: Goal: Adequate nutrition will be maintained Outcome: Progressing   Problem: Coping: Goal: Level of anxiety will decrease Outcome: Progressing   Problem: Elimination: Goal: Will not experience complications related to bowel motility Outcome: Progressing Goal: Will not experience complications related to urinary retention Outcome: Progressing   Problem: Pain Managment: Goal: General experience of comfort will improve Outcome: Progressing   Problem: Safety: Goal: Ability to remain free from injury will improve Outcome: Progressing   Problem: Skin Integrity: Goal: Risk for impaired skin integrity will decrease Outcome: Progressing   Problem: Activity: Goal: Ability to tolerate increased activity will improve Outcome: Progressing   Problem: Respiratory: Goal: Ability to maintain a clear airway and adequate ventilation will improve Outcome: Progressing

## 2020-11-19 NOTE — Progress Notes (Signed)
Orthostatics were requested for this AM. They have finally been completed this evening- please see my note for the plan.

## 2020-11-19 NOTE — Progress Notes (Signed)
Pt continues to refuse to get out of bed for orthostatic vital signs. Pt educated. Will continue to reassess.  MD stated, "If he gets fluid overloaded, its because we could not get orthostatics and I don't have much to go by."

## 2020-11-19 NOTE — Progress Notes (Addendum)
PROGRESS NOTE    Devin [name]   POE:[id number]  DOB: [birth year]  DOA: 10/31/2020 PCP: Prince Solian, MD   Brief Narrative:  Devin [name] is a 80 year old male with diabetes mellitus, lung adenocarcinoma status post left upper lobectomy, Crohn's disease, thyroid cancer, metastatic prostate cancer, atrial fibrillation, ankylosing spondylitis who is status post T11-12 fusion after a chance fracture.  He was hospitalized from 4/3 through 4/29 after a fall and development of compression fracture which were fused as mentioned. Hospital course was complicated by sepsis secondary to Klebsiella pneumonia.  He was found to have supraglottic and subglottic stenosis and tracheomalacia and requite a tracheostomy. He was discharged to CIR with a trach and a core track on 4/29. Patient was readmitted to the hospital on 4/30 from CIR after the core track dislodged and was unable to be reinserted.   On 5/3 his trach was changed and subsequently, he was unable to be suctioned via this trach.  PCCM was consulted and it was discovered that he had significant inflammatory tissue in the posterior aspect of the trachea.  PCCM was able to pass an XLT.  On 5/5, he was discharged back to [facility] inpatient rehab.  On 5/24, a PEG tube was placed by general surgery and patient was hypotensive postop.  He was started on phenylephrine and admitted to the ICU.  He was found to be acutely anemic with a drop in hemoglobin from 12.9-8.8.  2 units of packed red blood cells were ordered he was taken back to the OR.  An ex lap with ligation of omentum and washout of hemoperitoneum was performed.    On 6/6 he was noted to have bleeding around the gastrostomy again and tube feeds were discontinued.  The tube was sutured once again.  General surgery recommended against anticoagulation in the future due to ongoing risk for bleeding from the site.  Needs were resumed. On 6/8 the patient was transferred to the [facility] hospitalist  service.  Subjective: He complains of of feeling hungry.  He also did not get any sleep last night and has been a little sleepy during the day today according to his daughter.     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 If from Lasix that was given a couple of days ago-  -  6/10 > Urine output documented to have been 575 - started NS at 75 cc /hr- later also noted to be orthostatic and 500 cc bolus given - RN states he had 5 watery BMs 6/10- I noted that he takes Imodium at home (which he agrees to)- no abd pain or vomiting- he is on Questran at home which we are continuring- he takes Imodium daily PRN at home- 1 dose given on 6/11- no further diarrhea per RN - 1 liquid BM documented for today - per orthostatics that were done this evening he is still orthostatic- will check AM cortisol and place TEDS in AM. Repeat orthostatic in the  AM with TEDS on.     Postsurgical hypothyroidism - TSH rising over the past few months 27.901< 17.9< 11< 9 -he has been on Synthroid 125 mcg in the hospital and while at CIR -  increased Synthroid to 150 on 6/9 - recheck TSH in a few wks   Atrial fibrillation, paroxysmal CHA2DS2-VASc Score 5 - holding Eliquis due to bleeding from PEG - cont Diltiazem 30 Q 6 hrs per tube- he had a very brief run of SVT on 6/9 which resolved spontaneously -    Essential hypertension - Atenolol on hold- cont Cardizem   UTI with urinary retention, BPH - He was on Cefepime and this was changed to Ancef and then Keflex to treat a UTI after the sputum culture resulted with normal flora-   Has completed 7 days today - foley was initially placed on 5/16 by urology-  voiding trial given but had to be replaced on 6/8 as, despite voiding, he was still retaining a large amount of urine  - cont Flomax - Urology consult 6/9> cont foley until more mobile- if he continues to retain, will need outpt urodynamic studies- Dr Abner Greenspan notes that he would message Dr Alinda Money to arrange f/u    DM (diabetes mellitus), type 2 with renal complications  -  cont 12 U and low dose Novolog scale- sugars 130s-180s- avoid tighter control than this   Crohn's disease  - He states he has not had to take sulfasalazine at home- it was orderd 500 mg daily here- d/c 'dthis at on 6/11   h/o  Ankylosing spondylitis   Unstable T11-12 chance fracture - s/p Posterior thoracic fusion T11-12 with allograft, segmental fixation  T9-L1 on 4/8    Dysphagia with PEG tube- Malnutrition of moderate degree - cont tube feeds which he is tolerating well - SLP following - MBS 6/8-Thickening of prevertebral tissue at the level of C4-5 continues to be noted and still impede complete epiglottic inversion, but this was slightly improved compared to the initial study on 09/29/20. Recommended NPO for now - as he states he is hungry today, I have asked for Nutrition to help address this (may need more calories and/or morre fiber)  Anxiety - d/c'd Clonazepam due to excessive sedation - using very low dose Xanax - cont Paxil  Insomnia - increase Trazodone to 100 from 50 mg   H/o Prostate cancer  Adenocarcinoma of left lung, stage 1 S/P lobectomy of lung  Time spent in minutes: 35 DVT prophylaxis: Place and maintain sequential compression device Start: 11/13/20 0811 SCDs Start: 10/31/20 1451 Code Status: DNR Family Communication: daughter at bedside Level of Care: Level of care: Progressive Disposition Plan:  Status is: Inpatient  Remains inpatient appropriate because:Inpatient level of care appropriate due to severity of illness   Dispo: The patient is from: SNF              Anticipated d/c is to:  LTAC vs CIR              Patient currently is not medically stable to d/c.   Difficult to place patient No   Consultants:  PCCM General surgery Palliative care Procedures:  PEG tube placement Central line Trach changes Antimicrobials:  Anti-infectives (From admission, onward)    Start     Dose/Rate Route Frequency Ordered Stop   11/15/20 2300  cephALEXin (KEFLEX) 250 MG/5ML suspension 500 mg        500 mg Per Tube Every 6 hours 11/15/20 1528 11/16/20 1842   11/14/20 1400  ceFAZolin (ANCEF) IVPB 2g/100 mL premix  Status:  Discontinued        2 g 200 mL/hr over 30 Minutes Intravenous Every 8 hours 11/14/20 1049 11/15/20 1528  11/12/20 1400  ceFEPIme (MAXIPIME) 2 g in sodium chloride 0.9 % 100 mL IVPB  Status:  Discontinued        2 g 200 mL/hr over 30 Minutes Intravenous Every 8 hours 11/12/20 1306 11/14/20 1049   11/12/20 0430  ceFEPIme (MAXIPIME) 1 g in sodium chloride 0.9 % 100 mL IVPB  Status:  Discontinued        1 g 200 mL/hr over 30 Minutes Intravenous Every 8 hours 11/12/20 0324 11/12/20 1306        Objective: Vitals:   11/19/20 0744 11/19/20 0754 11/19/20 1112 11/19/20 1200  BP: (!) 131/40 (!) 141/40 (!) 159/65 (!) 129/53  Pulse: 72 71 77 69  Resp: (!) 26 20 (!) 25 (!) 21  Temp:  98.2 F (36.8 C)  98.7 F (37.1 C)  TempSrc:  Oral  Axillary  SpO2: 100% 100% 100% 98%  Weight:        Intake/Output Summary (Last 24 hours) at 11/19/2020 1332 Last data filed at 11/19/2020 1233 Gross per 24 hour  Intake 2927 ml  Output 4500 ml  Net -1573 ml    Filed Weights   11/16/20 0500 11/18/20 0224 11/19/20 0500  Weight: 85.1 kg 85.1 kg 82.8 kg    Examination: General exam: Appears comfortable  HEENT: PERRLA, oral mucosa moist, no sclera icterus or thrush- trac in place Respiratory system: Clear to auscultation. Respiratory effort normal. Cardiovascular system: S1 & S2 heard, regular rate and rhythm Gastrointestinal system: Abdomen soft, non-tender, nondistended.  Normal bowel sounds  - PEG tube present Central nervous system: Alert and oriented. No focal neurological deficits. Extremities: No cyanosis, clubbing or edema Skin: No rashes or ulcers Psychiatry:  Mood & affect appropriate.       Data Reviewed: I have personally reviewed following labs and imaging studies  CBC: Recent Labs  Lab 11/12/20 1922 11/12/20 2344 11/14/20 0035 11/15/20 0207 11/16/20 0052 11/19/20 0312  WBC 14.5*  --   --  13.0* 11.4* 8.8  HGB 9.6* 8.5* 9.0* 8.5* 8.8* 8.9*  HCT 30.1* 26.4* 27.7* 26.3* 26.5* 28.2*  MCV 97.4  --   --  95.3 95.0 96.2  PLT 396  --   --  362 308 828    Basic Metabolic Panel: Recent Labs  Lab 11/14/20 0035 11/16/20 0052 11/17/20 0116 11/18/20 0958 11/19/20 0312  NA 129* 127* 126* 130* 131*  K 5.0 4.2 4.4 4.2 3.9  CL 94* 94* 92* 94* 96*  CO2 28 26 27 30 31   GLUCOSE 100* 172* 173* 145* 147*  BUN 32* 31* 29* 23 21  CREATININE 0.97 0.92 0.92 0.82 0.73  CALCIUM 9.2 8.5* 8.4* 8.6* 8.5*    GFR: Estimated Creatinine Clearance: 82.4 mL/min (by C-G formula based on SCr of 0.73 mg/dL). Liver Function Tests: No results for input(s): AST, ALT, ALKPHOS, BILITOT, PROT, ALBUMIN in the last 168 hours.  No results for input(s): LIPASE, AMYLASE in the last 168 hours. No results for input(s): AMMONIA in the last 168 hours. Coagulation Profile: No results for input(s): INR, PROTIME in the last 168 hours. Cardiac Enzymes: No results for input(s): CKTOTAL, CKMB, CKMBINDEX, TROPONINI in the last 168 hours. BNP (last 3 results) No results for input(s): PROBNP in the last 8760 hours. HbA1C: No results for input(s): HGBA1C in the last 72 hours.  CBG: Recent Labs  Lab 11/18/20 1954 11/18/20 2324 11/19/20 0338 11/19/20 0755 11/19/20 1203  GLUCAP 106* 137* 138* 158* 144*    Lipid Profile: No results for input(s):  CHOL, HDL, LDLCALC, TRIG, CHOLHDL, LDLDIRECT in the last 72 hours. Thyroid Function Tests: No results for input(s): TSH,  T4TOTAL, FREET4, T3FREE, THYROIDAB in the last 72 hours.  Anemia Panel: No results for input(s): VITAMINB12, FOLATE, FERRITIN, TIBC, IRON, RETICCTPCT in the last 72 hours. Urine analysis:    Component Value Date/Time   COLORURINE YELLOW (A) 11/11/2020 2202   APPEARANCEUR CLOUDY (A) 11/11/2020 2202   LABSPEC RESULTS UNAVAILABLE DUE TO INTERFERING SUBSTANCE 11/11/2020 2202   PHURINE RESULTS UNAVAILABLE DUE TO INTERFERING SUBSTANCE 11/11/2020 2202   GLUCOSEU RESULTS UNAVAILABLE DUE TO INTERFERING SUBSTANCE (A) 11/11/2020 2202   GLUCOSEU NEGATIVE 03/05/2016 1203   HGBUR RESULTS UNAVAILABLE DUE TO INTERFERING SUBSTANCE (A) 11/11/2020 2202   BILIRUBINUR RESULTS UNAVAILABLE DUE TO INTERFERING SUBSTANCE (A) 11/11/2020 2202   KETONESUR RESULTS UNAVAILABLE DUE TO INTERFERING SUBSTANCE (A) 11/11/2020 2202   PROTEINUR RESULTS UNAVAILABLE DUE TO INTERFERING SUBSTANCE (A) 11/11/2020 2202   UROBILINOGEN 0.2 03/05/2016 1203   NITRITE RESULTS UNAVAILABLE DUE TO INTERFERING SUBSTANCE (A) 11/11/2020 2202   LEUKOCYTESUR RESULTS UNAVAILABLE DUE TO INTERFERING SUBSTANCE (A) 11/11/2020 2202   Sepsis Labs: @LABRCNTIP (procalcitonin:4,lacticidven:4) ) Recent Results (from the past 240 hour(s))  Culture, Urine     Status: Abnormal   Collection Time: 11/12/20  3:10 AM   Specimen: Urine, Catheterized  Result Value Ref Range Status   Specimen Description URINE, CATHETERIZED  Final   Special Requests   Final    Normal Performed at [facility], [place] [address] [place], [place] [address]    Culture >=100,000 COLONIES/mL PROTEUS MIRABILIS (A)  Final   Report Status 11/14/2020 FINAL  Final   Organism ID, Bacteria PROTEUS MIRABILIS (A)  Final      Susceptibility   Proteus mirabilis - MIC*    AMPICILLIN <=2 SENSITIVE Sensitive     CEFAZOLIN 8 SENSITIVE Sensitive     CEFEPIME <=0.12 SENSITIVE Sensitive     CEFTRIAXONE <=0.25 SENSITIVE Sensitive     CIPROFLOXACIN <=0.25 SENSITIVE Sensitive     GENTAMICIN  4 SENSITIVE Sensitive     IMIPENEM 8 INTERMEDIATE Intermediate     NITROFURANTOIN 128 RESISTANT Resistant     TRIMETH/SULFA <=20 SENSITIVE Sensitive     AMPICILLIN/SULBACTAM <=2 SENSITIVE Sensitive     PIP/TAZO <=4 SENSITIVE Sensitive     * >=100,000 COLONIES/mL PROTEUS MIRABILIS  Culture, Respiratory w Gram Stain     Status: None   Collection Time: 11/12/20  2:58 PM   Specimen: Tracheal Aspirate; Respiratory  Result Value Ref Range Status   Specimen Description TRACHEAL ASPIRATE  Final   Special Requests NONE  Final   Gram Stain   Final    MODERATE WBC PRESENT, PREDOMINANTLY PMN RARE GRAM POSITIVE COCCI RARE GRAM VARIABLE ROD    Culture   Final    RARE Normal respiratory flora-no Staph aureus or Pseudomonas seen Performed at [facility], [address] [place], [place] [address]    Report Status 11/14/2020 FINAL  Final         Radiology Studies: No results found.    Scheduled Meds:  sodium chloride   Intravenous Once   arformoterol  15 mcg Nebulization BID   bethanechol  10 mg Per Tube QID   budesonide  0.5 mg Nebulization BID   chlorhexidine  15 mL Mouth Rinse BID   Chlorhexidine Gluconate Cloth  6 each Topical Daily   cholestyramine  4 g Per Tube BID   diltiazem  30 mg Per Tube Q6H   Gerhardt's butt  cream   Topical BID   guaiFENesin  15 mL Per Tube Q6H   insulin aspart  0-9 Units Subcutaneous Q4H   insulin glargine  12 Units Subcutaneous QHS   levothyroxine  150 mcg Per Tube Q0600   mouth rinse  15 mL Mouth Rinse q12n4p   multivitamin with minerals  1 tablet Per Tube Daily   PARoxetine  10 mg Per Tube Daily   sodium chloride flush  10-40 mL Intracatheter Q12H   tamsulosin  0.4 mg Oral Daily   traZODone  100 mg Per Tube QHS   Continuous Infusions:  sodium chloride 250 mL (11/03/20 0324)   sodium chloride 100 mL/hr at 11/19/20 0621   feeding supplement (VITAL AF 1.2 CAL) 1,000 mL (11/18/20 2018)     LOS: 19 days      Debbe Odea, MD [facility] Pager: [email or web address] 11/19/2020, 1:32 PM

## 2020-11-19 NOTE — Progress Notes (Addendum)
Gtube confirmed in place,  pt HOB >30 degrees, medication administered, pt tolerated well.

## 2020-11-20 ENCOUNTER — Institutional Professional Consult (permissible substitution)
Admission: EM | Admit: 2020-11-20 | Discharge: 2020-11-22 | Disposition: A | Discharge status: Still In Hospital | Payer: Medicare Other | Source: Other Acute Inpatient Hospital

## 2020-11-20 ENCOUNTER — Other Ambulatory Visit (HOSPITAL_COMMUNITY): Payer: Self-pay

## 2020-11-20 DIAGNOSIS — C7989 Secondary malignant neoplasm of other specified sites: Secondary | ICD-10-CM

## 2020-11-20 DIAGNOSIS — R1310 Dysphagia, unspecified: Secondary | ICD-10-CM

## 2020-11-20 DIAGNOSIS — R339 Retention of urine, unspecified: Secondary | ICD-10-CM

## 2020-11-20 DIAGNOSIS — I480 Paroxysmal atrial fibrillation: Secondary | ICD-10-CM | POA: Diagnosis present

## 2020-11-20 DIAGNOSIS — C61 Malignant neoplasm of prostate: Secondary | ICD-10-CM

## 2020-11-20 DIAGNOSIS — E1142 Type 2 diabetes mellitus with diabetic polyneuropathy: Secondary | ICD-10-CM

## 2020-11-20 DIAGNOSIS — J9601 Acute respiratory failure with hypoxia: Secondary | ICD-10-CM

## 2020-11-20 DIAGNOSIS — Z85850 Personal history of malignant neoplasm of thyroid: Secondary | ICD-10-CM

## 2020-11-20 DIAGNOSIS — E1165 Type 2 diabetes mellitus with hyperglycemia: Secondary | ICD-10-CM

## 2020-11-20 DIAGNOSIS — J9621 Acute and chronic respiratory failure with hypoxia: Secondary | ICD-10-CM | POA: Diagnosis present

## 2020-11-20 DIAGNOSIS — Z431 Encounter for attention to gastrostomy: Secondary | ICD-10-CM

## 2020-11-20 DIAGNOSIS — Z931 Gastrostomy status: Secondary | ICD-10-CM

## 2020-11-20 DIAGNOSIS — G4700 Insomnia, unspecified: Secondary | ICD-10-CM

## 2020-11-20 DIAGNOSIS — I951 Orthostatic hypotension: Secondary | ICD-10-CM

## 2020-11-20 DIAGNOSIS — C3492 Malignant neoplasm of unspecified part of left bronchus or lung: Secondary | ICD-10-CM | POA: Diagnosis present

## 2020-11-20 DIAGNOSIS — J386 Stenosis of larynx: Secondary | ICD-10-CM | POA: Diagnosis present

## 2020-11-20 HISTORY — DX: Stenosis of larynx: J38.6

## 2020-11-20 HISTORY — DX: Paroxysmal atrial fibrillation: I48.0

## 2020-11-20 HISTORY — DX: Acute and chronic respiratory failure with hypoxia: J96.21

## 2020-11-20 LAB — GLUCOSE, CAPILLARY
Glucose-Capillary: 131 mg/dL — ABNORMAL HIGH (ref 70–99)
Glucose-Capillary: 135 mg/dL — ABNORMAL HIGH (ref 70–99)
Glucose-Capillary: 135 mg/dL — ABNORMAL HIGH (ref 70–99)
Glucose-Capillary: 138 mg/dL — ABNORMAL HIGH (ref 70–99)
Glucose-Capillary: 140 mg/dL — ABNORMAL HIGH (ref 70–99)
Glucose-Capillary: 158 mg/dL — ABNORMAL HIGH (ref 70–99)

## 2020-11-20 LAB — BASIC METABOLIC PANEL
Anion gap: 5 (ref 5–15)
BUN: 21 mg/dL (ref 8–23)
CO2: 30 mmol/L (ref 22–32)
Calcium: 8.2 mg/dL — ABNORMAL LOW (ref 8.9–10.3)
Chloride: 98 mmol/L (ref 98–111)
Creatinine, Ser: 0.69 mg/dL (ref 0.61–1.24)
GFR, Estimated: >60 mL/min (ref >60)
Glucose, Bld: 111 mg/dL — ABNORMAL HIGH (ref 70–99)
Potassium: 3.7 mmol/L (ref 3.5–5.1)
Sodium: 133 mmol/L — ABNORMAL LOW (ref 135–145)

## 2020-11-20 LAB — CORTISOL: Cortisol, Plasma: 11.7 ug/dL

## 2020-11-20 MED ORDER — VITAL AF 1.2 CAL PO LIQD: 1000.0000 mL | ORAL | Status: AC

## 2020-11-20 MED ORDER — GERHARDT'S BUTT CREAM: 1.0000 "application " | TOPICAL_CREAM | Freq: Two times a day (BID) | CUTANEOUS | Status: AC

## 2020-11-20 MED ORDER — ACETAMINOPHEN 325 MG PO TABS
650.0000 mg | ORAL_TABLET | Freq: Four times a day (QID) | ORAL | Status: AC | PRN
Start: 2020-11-20 — End: ?

## 2020-11-20 MED ORDER — ALPRAZOLAM 0.25 MG PO TABS: 0.1250 mg | ORAL_TABLET | Freq: Two times a day (BID) | ORAL | 0 refills | Status: AC | PRN

## 2020-11-20 MED ORDER — JEVITY 1.2 CAL PO LIQD
1000.0000 mL | ORAL | Status: DC
  Administered 2020-11-20: 1000 mL
  Filled 2020-11-20 (×2): qty 1000

## 2020-11-20 MED ORDER — OXYCODONE HCL 5 MG PO TABS: 5.0000 mg | ORAL_TABLET | ORAL | 0 refills | Status: AC | PRN

## 2020-11-20 MED ORDER — PROSOURCE TF PO LIQD
45.0000 mL | Freq: Three times a day (TID) | ORAL | Status: DC
  Administered 2020-11-20: 45 mL
  Filled 2020-11-20: qty 45

## 2020-11-20 MED ORDER — LEVOTHYROXINE SODIUM 150 MCG PO TABS: 150.0000 ug | ORAL_TABLET | Freq: Every day | ORAL | Status: AC

## 2020-11-20 MED ORDER — DIATRIZOATE MEGLUMINE & SODIUM 66-10 % PO SOLN
ORAL | Status: AC
  Filled 2020-11-20: qty 30

## 2020-11-20 MED ORDER — PROSOURCE TF PO LIQD: 45.0000 mL | Freq: Three times a day (TID) | ORAL | Status: AC

## 2020-11-20 MED ORDER — DIATRIZOATE MEGLUMINE & SODIUM 66-10 % PO SOLN
30.0000 mL | Freq: Once | ORAL | Status: AC
  Administered 2020-11-20: 30 mL

## 2020-11-20 MED ORDER — DIPHENOXYLATE-ATROPINE 2.5-0.025 MG PO TABS: 1.0000 | ORAL_TABLET | Freq: Every day | ORAL | 0 refills | Status: AC | PRN

## 2020-11-20 MED ORDER — INSULIN GLARGINE 100 UNIT/ML ~~LOC~~ SOLN: 12.0000 [IU] | Freq: Every day | SUBCUTANEOUS | 11 refills | Status: AC

## 2020-11-20 MED ORDER — TRAZODONE HCL 100 MG PO TABS: 100.0000 mg | ORAL_TABLET | Freq: Every day | ORAL | Status: AC

## 2020-11-20 MED ORDER — SODIUM CHLORIDE 0.9 % IV SOLN: 75.0000 mL | INTRAVENOUS | 0 refills | Status: AC

## 2020-11-20 MED ORDER — INSULIN ASPART 100 UNIT/ML IJ SOLN: 0.0000 [IU] | INTRAMUSCULAR | 11 refills | Status: AC

## 2020-11-20 MED ORDER — PAROXETINE HCL 10 MG PO TABS: 10.0000 mg | ORAL_TABLET | Freq: Every day | ORAL | Status: AC

## 2020-11-20 MED ORDER — ADULT MULTIVITAMIN W/MINERALS CH: 1.0000 | ORAL_TABLET | Freq: Every day | ORAL | Status: AC

## 2020-11-20 MED ORDER — IPRATROPIUM-ALBUTEROL 0.5-2.5 (3) MG/3ML IN SOLN: 3.0000 mL | RESPIRATORY_TRACT | Status: AC | PRN

## 2020-11-20 MED ORDER — BETHANECHOL CHLORIDE 10 MG PO TABS: 10.0000 mg | ORAL_TABLET | Freq: Four times a day (QID) | ORAL | Status: AC

## 2020-11-20 MED ORDER — ARFORMOTEROL TARTRATE 15 MCG/2ML IN NEBU: 15.0000 ug | INHALATION_SOLUTION | Freq: Two times a day (BID) | RESPIRATORY_TRACT | Status: AC

## 2020-11-20 NOTE — Progress Notes (Addendum)
[facility]  NAME:  Devin [name], MRN:  [id number], DOB:  09-14-1940, LOS: 58 ADMISSION DATE:  10/31/2020, CONSULTATION DATE:  5/3 REFERRING MD:  Verlon Au, CHIEF COMPLAINT:  Dyspnea   History of Present Illness:  80 y/o had a fall and spinal fracture with post admission course complicated by prolonged mechanical ventilation and 2 extubation failures, ultimately requiring a tracheostomy.  There was concern for supra and sub-glottic airway narrowing.  XL tracheostomy ultimately placed on 5/3.  Moved to Rehab and has been working with them there.   On 5/24 underwent PEG tube placement in the OR with Dr. Donne Hazel complicated by intra-abdominal bleeding requiring emergent re-exploration.  6/6 readmitted to the hospital   Pertinent  Medical History  Atrial fib DM2 Lung adenocarcinoma> s/p LU Lobectomy crohns disease Thyroid cancer Prostate cancer  T11-12 fracture, required posterior fusion Soft tissue mass supraglottic larnyx, negative for malignancy on biopsy Ankylosing spondylitis  Significant Hospital Events: Including procedures, antibiotic start and stop dates in addition to other pertinent events   4/29 admitted for cortrak malposition, aspiration and encephalopathy 5/3 PCCM emergently consulted for inability to suction and upper airway obstruction Had sig increase of inflammatory tissue on posterior wall of trachea. Was able to bypass with distal XLT. 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 Voice quality better. Feeling stronger  5/16 foley placed by urology. working with OT, talking  5/19 capping trials with tracheostomy 5/24 PEG placement in OR. PCCM reconsulted on for hypotension. 6.0 XLTD cuffless swithced to 6.0 XLTD cuffed. CVC.  Went to OR for ex lap, washout, ligation of omental artery bleed 5/25 stabilized, start PS trials 5/26 - 5/30 Not tolerating trach collar trials for >1 hour 5/31 A-fib rate of  160 while OOB., tolerating trach collar 6/1 rate controlled and Eliquis resumed  Tolerating trach collar for short period 6/4 4 hours trach collar, new infiltrate, WBC up 6/5 cefepime for HCAP.  bleeding around PEG site. Eliquis stopped again. Had to have surg place suture.  6/6 resuming PMV trials. Was off vent all night  6/7 removed vent from room. Asking SLP to re-eval for swallowing. Trach changed to Cuffless distal 6 XLT. Evaluation of subglottic site specifically the area of granulation that had been present at the distal end of the regular 6 trach had resolved completely. 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 ABX changed to Ancef. Stop date placed. LTAC referral placed. Foley removed.  [facility] assumed care. 6/8 repeat MBS-->failed; still aspirating but perhaps making slow improvement. Foley replaced d/t retention.  6/9, insurance denied LTAC, LTAC submitted appeal 610 through 6/13 fatigue.  Frequent stools.  Hyponatremia secondary to volume depletion improving with hydration.  Random cortisol low  Interim History / Subjective:  No significant changes from a tracheostomy standpoint Objective   Blood pressure (Abnormal) 113/33, pulse 63, temperature 98.1 F (36.7 C), temperature source Axillary, resp. rate 18, weight 82.8 kg, SpO2 100 %.    FiO2 (%):  [21 %] 21 %   Intake/Output Summary (Last 24 hours) at 11/20/2020 0934 Last data filed at 11/20/2020 0329 Gross per 24 hour  Intake no documentation  Output 2350 ml  Net -2350 ml   Filed Weights   11/16/20 0500 11/18/20 0224 11/19/20 0500  Weight: 85.1 kg 85.1 kg 82.8 kg    Examination:  General 80 year old male patient lying in bed he is fatigued today but not in any distress HEENT normocephalic atraumatic mucous membranes are moist  the 6 distal XLT tracheostomy is unremarkable Pulmonary clear to auscultation diminished bases aerosol trach collar in place Cardiac regular irregular, atrial fibrillation on telemetry Abdomen soft PEG tube  unremarkable tolerating tube feeds still has liquid stool GU Foley catheter in place Neuro awake oriented follows commands generalized weakness Extremities warm dry with brisk capillary refill  Labs/imaging that I havepersonally reviewed  (right click and "Reselect all SmartList Selections" daily)  See below    Resolved Hospital Problem list   Supraglottic stenosis now resolved Hemorrhagic shock Pyuria and bacturia > asymptomatic, not consistent with UTI by clinical criteria  Assessment & Plan:   Acute on chronic respiratory failure with hypercarbia 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 Sutured (see surg notes last on 6/7 and 6/8) DM2 Atrial fibrillation: rate controlled not AC candidate d/t recurrent episodes of life threatening bleeding  Intermittent Fluid electrolyte imbalance: Hyponatremia Crohn's disease Anxiety, clinical depression and Insomnia: doing better since added Paxil during this stay  Hypothyroidism Deconditioning Urinary retention.--> foley last replaced 6/8. Cont Urecholine VT; would keep foley in place and have him f/u w/ urology, seen again on the inpatient side see note   Pulm problem list  Trach dependence s/p prolonged critical illness.  Dysphagia w/ on-going aspiration HCAP (likely aspiration: NOS) Malabsorption secondary to Crohn's disease Severe physical deconditioning.  Volume depletion/dehydration, hyponatremia improving with hydration Relative adrenal insufficiency  Discussion Still awaiting disposition.  His ongoing issues have not really changed.  From a trach standpoint remained stable.  His biggest issues from a tracheostomy standpoint remain his  deconditioning, and aspiration risk.  He is continuing to make slow improvement from this standpoint but until his dysphagia is resolved and his cough mechanics are reliably adequate the tracheostomy needs to stay in place  Plan Continue PMV as much as tolerated  Keep size 6 distal XLT  Wean oxygen  Continue guaifenesin  Continue PT/OT  I continue to support LTAC setting given how little reserves he has  Best Practice (right click and "Reselect all SmartList Selections" daily)   Diet/type: tubefeeds Pain/Anxiety/Delirium protocol Not indicated VAP protocol (if indicated): NA DVT prophylaxis: SCD GI prophylaxis: PPI Glucose control:  SSI and Basal coverage Central venous access:  N/A Arterial line:  N/A Foley:  Yes, and it is still needed Mobility:  OOB  PT consulted: Yes Studies pending: None Culture data pending:none Last reviewed culture data:today Antibiotics:ancef and not indicated  Antibiotic de-escalation:N/A Stop date: N/A Daily labs: not indicated Code Status:  DNR Last date of multidisciplinary goals of care discussion [per palliative] Prognosis: stable  Disposition:ready for dc to LTAC   I will see him again Thursday really nothing to offer currently  Critical care time: [place] ACNP-BC [place] Pager # [phone] OR # [phone] if no answer     Attending attestation:  I have seen and examined Devin [name]. His wife is at bedside. He denies complaints with his breathing, which feels at his recent baseline. His main complaint is his diarrhea, and his wife is concerned this is related to Crohn's. He has been unable to receive his home Crohn's med because it cannot be given via PEG.   BP (!) 113/33   Pulse 63   Temp 98.1 F (36.7  C) (Axillary)   Resp 18   Wt 82.8 kg   SpO2 100%   BMI 25.45 kg/m  Chronically ill, frail appearing man lying in bed in NAD Devin [name]/AT, eyes anicteric Breathing comfortably on TC, some dried  secretions in trach. Wet sounding cough, but no rhonchi.  S1S2, RRR Soft, NT, ND Skin warm, dry, pallor  Assessment & plan:  Acute respiratory failure with hypoxia requiring trach for vent weaning> ongoing trach dependence due to NM weakness Dysphagia and aspiration Subglottic stenosis Deconditioning HAP -Con't efforts at rehabilitation and improving nutritional status. His deconditioning is the largest barrier to safe decannulation of his trach. -Con't trach until he is more stable from a dysphagia standpoint with improved/ more effective cough mechanics. Routine trach care per protocol. -Agree with LTAC for ongoing rehabilitation -Consider GI consult to address diarrhea, which may be complicating his rehabilitation from a nutritional standpoint.  Wife updated at bedside. We will continue to follow 2 days per week. Please call in the interim with questions.  Julian Hy, DO 11/20/20 10:46 AM [facility]

## 2020-11-20 NOTE — Progress Notes (Signed)
  Speech Language Pathology Treatment: Dysphagia  Patient Details Name: Devin [name] MRN: [id number] DOB: April 09, 1941 Today's Date: 11/20/2020 Time: 1130-1150 SLP Time Calculation (min) (ACUTE ONLY): 20 min  Assessment / Plan / Recommendation Clinical Impression  Devin [name] was seen with PMSV in place for therapeutic PO trials and dysphagia exercise. He independently recalled his strategies of head turn, chin tuck, and effortful swallow, but unfortunately did not tolerate PO trials well. He was provided tsp sips of honey thick liquids and 1/2 tsp of puree. With each trial, RR was noted to increase from 20 to mid to high 30s. Oxygen saturation dropped to low 90s, and pt had immediate wet cough despite good use of strategies. Effortful swallows were completed without a bolus to target pharyngeal strength/ROM. He was educated on use of effortful swallows as exercise and to utilize ice chips after oral care with a goal of 100 effortful swallows per day (broken up into multiple sessions given poor endurance). 15 effortful swallows were completed in this session. Head should be in neutral position for this exercise. Use of diligent oral care QID is imperative to mitigate risk of aspiration PNA.   At this time, pt remains inappropriate for a PO diet. SLP service to continue for therapy as able.    HPI HPI: Pt is a 80 yo male admitted from CIR 5/24 to ICU after PEG placement and hypotension with intraabdominal bleed. Pt remained on vent post op. Pt with complex PMHx- admitted 09/10/20-10/06/20 after fall with T11 fx and L1 compression fx s/p PLIF T11-12 on 4/8. Pt with complex respiratory failure requiring trach 4/12.  Initial MBS 4/22 with severe pharyngeal dysphagia that related primarily to thickening of the prevertebral tissue at the level of C4-5. ENT consulted; supraglottic mass biopsied, negative for malignancy. 5/3 PCCM emergently consulted for inability to suction due to upper airway obstruction. Had  significant increase of inflammatory tissue on posterior wall of trachea. Was able to bypass with distal XLT. 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  5/12 repeat MBS on CIR - persisting dysphagia with no functional improvement. 4/09 PEG complicated by intra-abdominal bleeding requiring emergent re-exploration, back on vent. 6/4 tolerating TC; 6/7 trach changed back to cuffless and vent removed from room. Resumed PMV use. Swallow consult 6/7.      SLP Plan  Continue with current plan of care       Recommendations  Liquids provided via: Teaspoon Medication Administration: Via alternative means Supervision: Full supervision/cueing for compensatory strategies Compensations: Chin tuck;Effortful swallow (with left head turn) Postural Changes and/or Swallow Maneuvers: Seated upright 90 degrees;Chin tuck;Head turn left during swallow;Upright 30-60 min after meal      Patient may use Passy-Muir Speech Valve: During all waking hours (remove during sleep) PMSV Supervision: Intermittent         Oral Care Recommendations: Oral care QID;Oral care prior to ice chip/H20 (individual ice chips) Follow up Recommendations: LTACH SLP Visit Diagnosis: Dysphagia, pharyngeal phase (R13.13) Plan: Continue with current plan of care                      Devin [name] P. Jerre Vandrunen, M.S., [facility] Pathologist [facility] Pager: [place] 11/20/2020, 11:57 AM

## 2020-11-20 NOTE — Progress Notes (Addendum)
CSW received call from Denice Paradise, covering for Se[place] at [employer].  Pt appeal was successful and pt is now approved for LTAC admit.  Denice Paradise asking if pt is ready for transfer.    CSW message MD, pt has hypotension issue, this was relayed to Denice Paradise who reviewed chart and can address this in their program.  Denice Paradise will contact family to sign admission consents, needs DC summary.  MD informed.  Once ready, RN call report to [phone].   [name], MSW, LCSW 6/13/20221:00 PM   1310: Pt going to [facility] room 40, Accepting MD Dr Satira Sark.  [name], MSW, LCSW 6/13/20221:11 PM

## 2020-11-20 NOTE — Progress Notes (Signed)
Nutrition Follow-up  DOCUMENTATION CODES:  Non-severe (moderate) malnutrition in context of chronic illness  INTERVENTION:  Change TF to: Jevity 1.2 @ 75 ml/hr ProSource TF 30 ml TID  Provides 2280 kcal, 133 grams protein, 1458 ml of free water daily.  Free water flushes per CCM for hydration.  NUTRITION DIAGNOSIS:  Moderate Malnutrition related to chronic illness (Crohn's disease, Lung cancer) as evidenced by mild fat depletion, moderate fat depletion, mild muscle depletion, moderate muscle depletion. - ongoing  GOAL:  Patient will meet greater than or equal to 90% of their needs - met with TF  MONITOR:  Vent status, TF tolerance  REASON FOR ASSESSMENT:  Ventilator, Consult Enteral/tube feeding initiation and management  ASSESSMENT:  80 yo male admitted from rehab on 5/24 with post op hypotension S/P PEG. PMH includes Crohn's disease, ankylosing spondylitis, osteoporosis, hypothyroidism, DM-2, HTN, prostate CA, A fib. Recent hospital admission 4/3 for spinal fracture s/p fall at home. Admission was complicated by prolonged mechanical ventilation ultimately requiring tracheostomy on 5/3. Transferred to CIR 5/5. 4/29 - admitted for cortrak malposition, aspiration and encephalopathy 5/3 - PCCM emergently consulted for inability to suction and upper airway obstruction Had sig increase of inflammatory tissue on posterior wall of trachea. Was able to bypass with distal XLT. 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 Voice quality better. Feeling stronger 5/16 - foley placed by urology. working with OT, talking 5/19 - capping trials with tracheostomy 5/24 - PEG placement in OR. PCCM reconsulted on for hypotension. 6.0 XLTD cuffless swithced to 6.0 XLTD cuffed. CVC.  Went to OR for ex lap, washout, ligation of omental artery bleed 5/25 - stabilized, start PS trials 5/26 - 5/30 Not tolerating trach collar trials for >1  hour 5/31 - A-fib rate of 160 while OOB., tolerating trach collar 6/1 - rate controlled and Eliquis resumed; Tolerating trach collar for short period 6/4 - 4 hours trach collar, new infiltrate, WBC up 6/5 - cefepime for HCAP.  bleeding around PEG site. Eliquis stopped again. Had to have surg place suture. 6/6 - resuming PMV trials. Was off vent all night 6/7 - removed vent from room. Asking SLP to re-eval for swallowing. Trach changed to Cuffless distal 6 XLT. Evaluation of subglottic site specifically the area of granulation that had been present at the distal end of the regular 6 trach had resolved completely. 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 ABX changed to Ancef. Stop date placed. LTAC referral placed. Foley removed. [facility] assumed care. 6/8 repeat MBS-->failed; still aspirating but perhaps making slow improvement. Foley replaced d/t retention.  6/9 - insurance denied LTAC, LTAC submitted appeal 6/10-13 - fatigue.Frequent stools. Hyponatremia secondary to volume depletion improving with hydration.  Current TF order: Vital AF 1.2 at 75 ml/h Provides 2160 kcal, 135 gm protein, 1460 ml free water daily.  Pt complaining of hunger per MD. RD to change formula to Jevity 1.2 (contains fiber) to hopefully help with fullness. See interventions for new TF prescription.  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  Estimated Nutritional Needs:  Kcal:  2100-2300 Protein:  130-140 gm Fluid:  >/= 2.1 L  Derrel Nip, RD, LDN Registered Dietitian I After-Hours/Weekend Pager # in [place]

## 2020-11-20 NOTE — Discharge Summary (Addendum)
Physician Discharge Summary  Devin [name] YDX:[id number] DOB: 01-Sep-1940 DOA: 10/31/2020  PCP: Prince Solian, MD  Admit date: 10/31/2020 Discharge date: 11/20/2020  Admitted From: CIR Disposition: [facility]  Recommendations for Outpatient Follow-up:  F/u on orthostatic hypotension F/u on tube feed tolerance   Discharge Condition: Stable CODE STATUS: DO NOT RESUSCITATE Diet recommendation: Tube feeds-he has been started on vital AF 1.2 Cal at 107 cc/h today-see details below Consultations: Pulmonary critical care General surgery Palliative care Procedures/Studies: PEG tube placement Trach changes   Discharge Diagnoses:  Principal Problem:   Postoperative hypovolemic shock Active Problems:   Primary orthostatic hypotension   History of thyroid cancer   Postsurgical hypothyroidism   DM (diabetes mellitus), type 2 with renal complications (HCC)   Gout   Essential hypertension   Prostate cancer with recurrence   Diabetic neuropathy (HCC)   Ankylosing spondylitis ([place])   Crohn's disease ([place])   Adenocarcinoma of left lung, stage 1 ([place])   S/P lobectomy of lung   Thoracic compression fracture (HCC)   Paroxysmal atrial fibrillation ([place])   Tracheostomy dependence ([place])   Atrial fibrillation ([place])   Controlled type 2 diabetes mellitus with hyperglycemia, with long-term current use of insulin ([place])   Urinary retention   Insomnia     Brief Summary: Devin [name] is a 80 year old male with diabetes mellitus, lung adenocarcinoma status post left upper lobectomy, Crohn's disease, thyroid cancer, metastatic prostate cancer, atrial fibrillation, ankylosing spondylitis who is status post T11-12 fusion after a chance fracture.  He was hospitalized from 4/3 through 4/29 after a fall and development of compression fracture which were fused as mentioned. Hospital course was complicated by sepsis secondary to Klebsiella pneumonia.  He was found to have  supraglottic and subglottic stenosis and tracheomalacia and requite a tracheostomy. He was discharged to CIR with a trach and a core track on 4/29. Patient was readmitted to the hospital on 4/30 from CIR after the core track dislodged and was unable to be reinserted.   On 5/3 his trach was changed and subsequently, he was unable to be suctioned via this trach.  PCCM was consulted and it was discovered that he had significant inflammatory tissue in the posterior aspect of the trachea.  PCCM was able to pass an XLT.   On 5/5, he was discharged back to [facility] inpatient rehab.   On 5/24, a PEG tube was placed by general surgery and patient was hypotensive postop.  He was started on phenylephrine and admitted to the ICU.  He was found to be acutely anemic with a drop in hemoglobin from 12.9-8.8.  2 units of packed red blood cells were ordered he was taken back to the OR.  An ex lap with ligation of omentum and washout of hemoperitoneum was performed.     On 6/6 he was noted to have bleeding around the gastrostomy again and tube feeds were discontinued.  The tube was sutured once again.  General surgery recommended against anticoagulation in the future due to ongoing risk for bleeding from the site.  Needs were resumed. On 6/8 the patient was transferred to the triad hospitalist service.  Hospital Course:  Postoperative hypovolemic shock - see above regarding details of bleeding - holding Eliquis indefinitely now due to recurrent bleeding around PEG (can consider resuming once more healing occurs) - Hb stabilized at 8-9 range   Active Problems:   Tracheostomy dependence -Has a cuffless trach-stable on trach collar  - PCCM following   Hyponatremia -  dehydration- diarrhea -  6/10 > Urine output documented to have been 575 - started NS at 75 cc /hr- later also noted to be orthostatic and 500 cc bolus given - 6/11> RN states he had 5 watery BMs 6/10- I noted that he takes Imodium at home (which he agrees  to taking occasionally)- no abd pain or vomiting-given 1 dose of Imodium which resolved the diarrhea- suspect is was related to antibiotics as it resolved a couple of days after stopping them- Per RN flowsheets he is having 1-2 bowel movements a day-he is on Questran at home which we are continuring-  -Noted to be orthostatics on 6/11 and therefore started on normal saline which I have continued through today as he remains orthostatic-he admits to being dizzy when he is stood up-a.m. cortisol checked and found to be 11.7-   TED hose placed   -Due to hyponatremia (sodium 126) on 6/10, I chose to start normal saline rather than free water through the PEG-sodium has steadily improved to 133 - We will recommend to continue IV fluids and follow-up bmet, orthostatic vitals and fluid status while at [facility] (we notified [facility] about his orthostatic hypotension and they are willing to manage it)  Postsurgical hypothyroidism - TSH rising over the past few months 27.901< 17.9< 11< 9 -he has been on Synthroid 125 mcg in the hospital and while at CIR - increased Synthroid to 150 on 6/9 - recheck TSH in a few wks    Atrial fibrillation, paroxysmal CHA2DS2-VASc Score 5 - holding Eliquis due to bleeding from PEG - cont Diltiazem 30 Q 6 hrs per tube- he had a very brief run of SVT on 6/9 which resolved spontaneously -    Essential hypertension - Atenolol on hold- cont Cardizem    UTI with urinary retention, BPH, h/o prostate cancer - He was on Cefepime and this was changed to Ancef and then Keflex to treat a UTI after the sputum culture resulted with normal flora-   Has completed 7 days today - foley was initially placed on 5/16 by urology- voiding trial given but had to be replaced on 6/8 as, despite voiding, he was still retaining a large amount of urine - cont Flomax and Bethanacol - Urology consult 6/9 by Dr Abner Greenspan cont foley until more mobile- if he continues to retain, will need outpt urodynamic studies-  Dr Abner Greenspan notes that he would message Dr Alinda Money to arrange f/u     DM (diabetes mellitus), type 2 with renal complications -  cont Lantus 12 U nightly and low dose Novolog scale- sugars 130s-180s- avoid tighter control than this    Crohn's disease - He states he has not had to take sulfasalazine at home- it was orderd 500 mg daily here- d/c 'd this at on 6/11    h/o  Ankylosing spondylitis   Unstable T11-12 chance fracture - s/p Posterior thoracic fusion T11-12 with allograft, segmental fixation  T9-L1 on 4/8     Dysphagia with PEG tube- Malnutrition of moderate degree - cont tube feeds which he is tolerating well - SLP following and still recommending n.p.o. status today - MBS 6/8-Thickening of prevertebral tissue at the level of C4-5 continues to be noted and still impede complete epiglottic inversion, but this was slightly improved compared to the initial study on 09/29/20. Recommended NPO for now -When asked about complaints on 6/12, he admitted to being hungry and today is still complaining of this-I consulted nutrition yesterday and his tube feeds have been  changed to vital-continue to follow  Anxiety - d/c'd Clonazepam due to excessive sedation - using very low dose Xanax as needed (half the dose of what he uses at home) - cont Paxil   Insomnia - increased Trazodone to 100 from 50 mg yesterday-he states he slept well overnight and does not need it to be increased further   Adenocarcinoma of left lung, stage 1 S/P lobectomy of lung     Discharge Exam: Vitals:   11/20/20 0803 11/20/20 1122  BP: (!) 113/33   Pulse: 63 78  Resp: 18 (!) 26  Temp:    SpO2: 100% 100%   Vitals:   11/20/20 0342 11/20/20 0700 11/20/20 0803 11/20/20 1122  BP: (!) 129/44 (!) 137/45 (!) 113/33   Pulse: 79 77 63 78  Resp: (!) 27 (!) 23 18 (!) 26  Temp:  98.1 F (36.7 C)    TempSrc:  Axillary    SpO2: 100% 98% 100% 100%  Weight:        General: Pt is alert, awake, not in acute  distress Cardiovascular: RRR, S1/S2 +, no rubs, no gallops Respiratory: CTA bilaterally, no wheezing, no rhonchi- trach present Abdominal: Soft, NT, ND, bowel sounds +- PEG tube in place, incision is healing Extremities: no edema, no cyanosis   Discharge Instructions  Discharge Instructions     Increase activity slowly   Complete by: As directed    No wound care   Complete by: As directed       Allergies as of 11/20/2020   No Known Allergies      Medication List     STOP taking these medications    acetaminophen 160 MG/5ML solution Commonly known as: TYLENOL Replaced by: acetaminophen 325 MG tablet   acetylcysteine 20 % nebulizer solution Commonly known as: MUCOMYST   albuterol 108 (90 Base) MCG/ACT inhaler Commonly known as: VENTOLIN HFA   apixaban 5 MG Tabs tablet Commonly known as: ELIQUIS   atenolol 25 MG tablet Commonly known as: TENORMIN   diphenhydrAMINE 25 mg capsule Commonly known as: BENADRYL   fiber Pack packet   free water Soln   insulin aspart 100 UNIT/ML injection Commonly known as: novoLOG Replaced by: insulin aspart 100 UNIT/ML injection   lactobacillus Pack   loperamide HCl 1 MG/7.5ML suspension Commonly known as: IMODIUM   nutrition supplement (JUVEN) Pack Replaced by: feeding supplement (PROSource TF) liquid You also have another medication with the same name that you need to continue taking as instructed.   ondansetron 4 MG/2ML Soln injection Commonly known as: ZOFRAN   oxyCODONE 5 MG/5ML solution Commonly known as: ROXICODONE Replaced by: oxyCODONE 5 MG immediate release tablet   phenol 1.4 % Liqd Commonly known as: CHLORASEPTIC   piperacillin-tazobactam 3.375 GM/50ML IVPB Commonly known as: ZOSYN       TAKE these medications    acetaminophen 325 MG tablet Commonly known as: TYLENOL Place 2 tablets (650 mg total) into feeding tube every 6 (six) hours as needed for mild pain. Replaces: acetaminophen 160 MG/5ML  solution   ALPRAZolam 0.25 MG tablet Commonly known as: XANAX Take 0.5 tablets (0.125 mg total) by mouth 2 (two) times daily as needed for anxiety (use only for severe anxiety please). What changed:  medication strength how much to take reasons to take this   arformoterol 15 MCG/2ML Nebu Commonly known as: BROVANA Take 2 mLs (15 mcg total) by nebulization 2 (two) times daily.   bethanechol 10 MG tablet Commonly known as: URECHOLINE Place 1 tablet (  10 mg total) into feeding tube 4 (four) times daily.   budesonide 0.5 MG/2ML nebulizer solution Commonly known as: PULMICORT Take 2 mLs (0.5 mg total) by nebulization 2 (two) times daily.   cholestyramine 4 g packet Commonly known as: QUESTRAN Place 1 packet (4 g total) into feeding tube 2 (two) times daily before lunch and supper.   diltiazem 10 mg/ml  oral suspension Commonly known as: CARDIZEM Place 3 mLs (30 mg total) into feeding tube every 6 (six) hours.   diphenoxylate-atropine 2.5-0.025 MG tablet Commonly known as: LOMOTIL Take 1 tablet by mouth daily as needed for diarrhea or loose stools (after a watery BM).   feeding supplement (VITAL AF 1.2 CAL) Liqd Place 1,000 mLs into feeding tube continuous. What changed:  how much to take when to take this Another medication with the same name was removed. Continue taking this medication, and follow the directions you see here.   feeding supplement (PROSource TF) liquid Place 45 mLs into feeding tube 3 (three) times daily. What changed: You were already taking a medication with the same name, and this prescription was added. Make sure you understand how and when to take each. Replaces: nutrition supplement (JUVEN) Pack   Gerhardt's butt cream Crea Apply 1 application topically 2 (two) times daily.   insulin aspart 100 UNIT/ML injection Commonly known as: novoLOG Inject 0-9 Units into the skin every 4 (four) hours. Replaces: insulin aspart 100 UNIT/ML injection   insulin  glargine 100 UNIT/ML injection Commonly known as: LANTUS Inject 0.12 mLs (12 Units total) into the skin at bedtime. What changed: when to take this   ipratropium-albuterol 0.5-2.5 (3) MG/3ML Soln Commonly known as: DUONEB Take 3 mLs by nebulization every 4 (four) hours as needed.   levothyroxine 150 MCG tablet Commonly known as: SYNTHROID Place 1 tablet (150 mcg total) into feeding tube daily at 6 (six) AM. Start taking on: November 21, 2020 What changed:  medication strength how much to take how to take this when to take this   multivitamin with minerals Tabs tablet Place 1 tablet into feeding tube daily. Start taking on: November 21, 2020   oxyCODONE 5 MG immediate release tablet Commonly known as: Oxy IR/ROXICODONE Place 1 tablet (5 mg total) into feeding tube every 4 (four) hours as needed for moderate pain. Replaces: oxyCODONE 5 MG/5ML solution   PARoxetine 10 MG tablet Commonly known as: PAXIL Place 1 tablet (10 mg total) into feeding tube daily. Start taking on: November 21, 2020   sodium chloride 0.9 % infusion Inject 75 mLs into the vein continuous.   tamsulosin 0.4 MG Caps capsule Commonly known as: FLOMAX Take 1 capsule (0.4 mg total) by mouth daily.   traZODone 100 MG tablet Commonly known as: DESYREL Place 1 tablet (100 mg total) into feeding tube at bedtime.        Follow-up Information     Rolm Bookbinder, MD Follow up.   Specialty: General Surgery Why: may follow up as needed for g-tube Contact information: [address] [place] [address] [phone]                No Known Allergies    CT ABDOMEN WO CONTRAST  Result Date: 10/25/2020 CLINICAL DATA:  Assess anatomy for G-tube placement EXAM: CT ABDOMEN WITHOUT CONTRAST TECHNIQUE: Multidetector CT imaging of the abdomen was performed following the standard protocol without IV contrast. COMPARISON:  09/10/2020 FINDINGS: Lower chest: Small bilateral pleural effusions. Linear  opacities at the lung  bases likely due to atelectasis. There is airspace opacity in the right lower lobe suspicious for pneumonia. Hepatobiliary: Calcification located in the subcapsular region of the posterior right hepatic lobe may be due to prior granulomatous inflammation. The liver otherwise unremarkable. The gallbladder is absent. Pancreas: Unremarkable. No pancreatic ductal dilatation or surrounding inflammatory changes. Spleen: Normal in size without focal abnormality. Adrenals/Urinary Tract: Single punctate bilateral nonobstructing renal calculi. Kidneys and visualized segments of the ureters otherwise unremarkable. No significant abnormality of the adrenal glands. Stomach/Bowel: NG tube terminates in the distal gastric lumen. 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 No dilated bowel loops noted within the visualized abdomen. Vascular/Lymphatic: No enlarged abdominal lymph nodes. Atherosclerotic plaque seen scattered throughout the abdominal aorta. Other: Anterior abdominal wall surgical mesh material is noted. Areas of subcutaneous fat stranding in the anterior abdominal wall subcutaneous fat likely sites of medication administration. Musculoskeletal: Diffuse osteopenia extensive posterior fusion changes of the thoracolumbar spine. Fracture noted through the inferior endplate of F57 vertebral body. IMPRESSION: 1. Surgical mesh material interposed between the stomach and anterior abdominal wall. 2. Right lower lobe airspace opacities suspicious for pneumonia. Electronically Signed   By: Miachel Roux M.D.   On: 10/25/2020 08:28   DG Chest 2 View  Result Date: 10/23/2020 CLINICAL DATA:  Hypoxia.  History of hypertension and diabetes. EXAM: CHEST - 2 VIEW COMPARISON:  Radiographs 10/10/2020 and 10/07/2020.  CT 09/10/2020. FINDINGS: Tracheostomy and feeding tube are in place. There are persistent low lung volumes with moderate bilateral pleural effusions and associated bibasilar atelectasis. There may be  mild edema. No pneumothorax. The heart size and mediastinal contours are stable. Patient is status post thoracolumbar fusion with chronic fracture of the interconnecting rods bilaterally. IMPRESSION: Stable chest with persistent bilateral pleural effusions, bibasilar atelectasis and possible mild edema. Electronically Signed   By: Richardean Sale M.D.   On: 10/23/2020 08:13   DG Chest Port 1 View  Result Date: 11/11/2020 CLINICAL DATA:  Respiratory failure EXAM: PORTABLE CHEST 1 VIEW COMPARISON:  November 08, 2020 FINDINGS: Tracheostomy catheter tip is 4.3 cm above the carina. No pneumothorax. Small left pleural effusion noted. No edema or airspace opacity. Heart is upper normal in size with pulmonary vascularity normal. No adenopathy. Postoperative changes in the thoracic and visualized upper lumbar regions noted. IMPRESSION: Tracheostomy as described without pneumothorax. Small left pleural effusion. No edema or airspace opacity. Heart upper normal in size. Electronically Signed   By: Lowella Grip III M.D.   On: 11/11/2020 07:59   DG CHEST PORT 1 VIEW  Result Date: 11/08/2020 CLINICAL DATA:  History of lung infiltrate. EXAM: PORTABLE CHEST 1 VIEW COMPARISON:  11/06/2020. FINDINGS: Tracheostomy tube in stable position. Heart size normal. Persistent bibasilar atelectasis/infiltrates. Persistent small left pleural effusion. No definite right pleural effusion. No pneumothorax. Prior thoracolumbar spine fusion. Surgical clips noted over the upper chest and right upper quadrant. IMPRESSION: 1. Persistent bibasilar atelectasis/infiltrates. Small left pleural effusion. Exam unchanged from prior exam. 2.  Tracheostomy tube in stable position. Electronically Signed   By: [place]   On: 11/08/2020 05:47   DG CHEST PORT 1 VIEW  Result Date: 11/06/2020 CLINICAL DATA:  Cough.  Patient with a tracheostomy. EXAM: PORTABLE CHEST 1 VIEW COMPARISON:  11/01/2020 and older studies. FINDINGS: Hazy and linear opacities  at both lung bases partly obscuring hemidiaphragms. This has increased compared to the prior exam. Findings consistent with a combination of small effusions with atelectasis or possibly infection, atelectasis favored. Mid to upper lungs are clear. No  pneumothorax. Cardiac silhouette normal in size. No mediastinal or hilar masses. Stable tracheostomy. IMPRESSION: 1. Increase in lung base opacity since the prior exam consistent with a combination of pleural effusions and atelectasis, less likely infection. No evidence pulmonary edema. Electronically Signed   By: Lajean Manes M.D.   On: 11/06/2020 11:16   DG CHEST PORT 1 VIEW  Result Date: 11/01/2020 CLINICAL DATA:  Respiratory failure EXAM: PORTABLE CHEST 1 VIEW COMPARISON:  10/31/2020 FINDINGS: Tracheostomy, left internal jugular temporary hemodialysis catheter with its tip within the superior vena cava, nasoenteric feeding tube extending into the upper abdomen, are all unchanged. Pulmonary insufflation is stable. Mild right basilar atelectasis. Small laterally loculated left pleural effusion again noted. No pneumothorax. Cardiac size within normal limits. Pulmonary vascularity is normal. No acute bone abnormality. Thoracolumbar fusion hardware again noted IMPRESSION: Stable support lines and tubes. Stable pulmonary insufflation with right basilar atelectasis. Stable small laterally loculated left pleural effusion Electronically Signed   By: Fidela Salisbury MD   On: 11/01/2020 04:12   DG Chest Port 1 View  Result Date: 10/31/2020 CLINICAL DATA:  Trach ostomy tube change, central line insertion EXAM: PORTABLE CHEST 1 VIEW COMPARISON:  Portable exam 1525 hours compared to 10/22/2020 FINDINGS: New tracheostomy tube with tip projecting 13 mm above carina. LEFT jugular central venous catheter with tip projecting over SVC. Tip of feeding tube projects over distal gastric antrum. Normal heart size and mediastinal contours. Elevation of RIGHT diaphragm with bibasilar  atelectasis and minimal LEFT pleural effusion. No definite pneumothorax. Bones demineralized with prior thoracolumbar spinal fixation. IMPRESSION: Line and tube positions as above. Bibasilar atelectasis and minimal LEFT pleural effusion. Electronically Signed   By: Lavonia Dana M.D.   On: 10/31/2020 15:43   DG Abd Portable 1V  Result Date: 10/25/2020 CLINICAL DATA:  Feeding tube placement EXAM: PORTABLE ABDOMEN - 1 VIEW COMPARISON:  10/15/2020 FINDINGS: Extensive spinal hardware redemonstrated. Esophageal tube tip overlies the gastroduodenal junction. Evidence of prior hernia repair. Visible gas pattern is unobstructed. IMPRESSION: Feeding tube tip overlies the gastroduodenal region. Electronically Signed   By: Donavan Foil M.D.   On: 10/25/2020 16:46   DG Swallowing Func-Speech Pathology  Result Date: 11/15/2020 Objective Swallowing Evaluation: Type of Study: MBS-Modified Barium Swallow Study  Patient Details Name: Devin [name] MRN: [id number] Date of Birth: 1941-03-14 Today's Date: 11/15/2020 Time: SLP Start Time (ACUTE ONLY): 1400 -SLP Stop Time (ACUTE ONLY): 4163 SLP Time Calculation (min) (ACUTE ONLY): 20 min Past Medical History: Past Medical History: Diagnosis Date  A-fib ([place])   Adenocarcinoma of left lung, stage 1 ([place]) 03/10/2015  Ankylosing spondylitis ([place])   Diagnosed during lumbar fracture summer of 2016    Crohn's disease ([place])   Gout   HIstory of basal cell cancer of face   THYROID CA HX  History of kidney stones   Hypertension   Hypothyroidism   Impotence   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   Obesity (BMI 30-39.9)   Osteoporosis   Pt completed 5 years of fosamax in 2013     Prostate cancer with recurrence   Treated with prostatectomy with recurrence 2012 with Lupron treatment now him   Psoriasis   Thyroid cancer ([place]) 1999  Treated with RAI and total thyroidectomy   Type II diabetes mellitus ([place])  Past Surgical History: Past Surgical History: Procedure Laterality  Date  ABDOMINAL EXPLORATION SURGERY    for small bowel obstruction  APPENDECTOMY  03/2007  BACK SURGERY    BASAL CELL CARCINOMA  EXCISION  "several"  "head"  CARDIAC CATHETERIZATION  03/17/2003  CHOLECYSTECTOMY N/A 05/07/2016  Procedure: LAPAROSCOPIC CHOLECYSTECTOMY;  Surgeon: Fanny Skates, MD;  Location: [facility];  Service: General;  Laterality: N/A;  COLON SURGERY  03/2007  Resection of cecum, appendix, terminal ileum (approximately/notes 10/10/2010  CYSTOSCOPY/URETEROSCOPY/HOLMIUM LASER/STENT PLACEMENT Right 07/03/2020  Procedure: CYSTOSCOPY/RETROGRADE/URETEROSCOPY REMOVAL OF BLADDER STONE;  Surgeon: Raynelle Bring, MD;  Location: [facility];  Service: Urology;  Laterality: Right;  DIRECT LARYNGOSCOPY N/A 10/04/2020  Procedure: DIRECT LARYNGOSCOPY, PHARYNGOSCOPY WITH BIOPSY;  Surgeon: [name], DO;  Location: [facility];  Service: ENT;  Laterality: N/A;  ESOPHAGOSCOPY N/A 10/04/2020  Procedure: ESOPHAGOSCOPY WITH BIOPSY;  Surgeon: [name], DO;  Location: [facility];  Service: ENT;  Laterality: N/A;  GASTROSTOMY N/A 10/31/2020  Procedure: INSERTION OF GASTROSTOMY TUBE;  Surgeon: Rolm Bookbinder, MD;  Location: [facility];  Service: General;  Laterality: N/A;  HERNIA REPAIR    LAMINECTOMY WITH POSTERIOR LATERAL ARTHRODESIS LEVEL 1 N/A 09/15/2020  Procedure: REVISION OF THORACOLUMBAR FUSION, ADDITION OF CROSS-CONNECTORS;  Surgeon: Eustace Moore, MD;  Location: [facility];  Service: Neurosurgery;  Laterality: N/A;  LAPAROSCOPIC CHOLECYSTECTOMY  05/07/2016  LAPAROSCOPIC LYSIS OF ADHESIONS  05/07/2016  LAPAROSCOPIC LYSIS OF ADHESIONS N/A 05/07/2016  Procedure: LAPAROSCOPIC LYSIS OF ADHESIONS TIMES ONE HOUR;  Surgeon: Fanny Skates, MD;  Location: [facility];  Service: General;  Laterality: N/A;  LAPAROTOMY N/A 10/31/2020  Procedure: EXPLORATORY LAPAROTOMY WITH [place];  Surgeon: Rolm Bookbinder, MD;  Location: [facility];  Service: General;  Laterality: N/A;  POSTERIOR FUSION THORACIC SPINE  02/08/2016  1. Posterior thoracic  arthrodesis T7-T11 utilizing morcellized allograft, 2. Posterior thoracic segmental fixation T7-T11 utilizing nuvasive pedicle screws  PROSTATECTOMY  06/2001  w/bilateral pelvic lymph nose dissection/notes 10/24/2010  SPINAL FUSION  12/2014  Open reduction internal fixation of L1 Chance fracture with posterior fusion T10-L4 utilizing morcellized allograft and some local autograft, segmental instrumentation T10-L4 inclusive utilizing nuvasive pedicle screws/notes 12/16/2014  Stress Cardiolite  02/17/2003  THOROCOTOMY WITH LOBECTOMY  03/16/2015  Procedure: THOROCOTOMY WITH LOBECTOMY;  Surgeon: Ivin Poot, MD;  Location: [facility];  Service: Thoracic;;  TONSILLECTOMY    TOTAL THYROIDECTOMY  1997  Venous Doppler  05/30/2004  VENTRAL HERNIA REPAIR  04/14/2008  VIDEO ASSISTED THORACOSCOPY Left 03/16/2015  Procedure: VIDEO ASSISTED THORACOSCOPY;  Surgeon: Ivin Poot, MD;  Location: [facility] OR;  Service: Thoracic;  Laterality: Left; HPI: Pt is a 80 yo male admitted from [place] 5/24 to ICU after PEG placement and hypotension with intraabdominal bleed. Pt remained on vent post op. Pt with complex PMHx- admitted 09/10/20-10/06/20 after fall with T11 fx and L1 compression fx s/p PLIF T11-12 on 4/8. Pt with complex respiratory failure requiring trach 4/12.  Initial MBS 4/22 with severe pharyngeal dysphagia that related primarily to thickening of the prevertebral tissue at the level of C4-5. ENT consulted; supraglottic mass biopsied, negative for malignancy. 5/3 PCCM emergently consulted for inability to suction due to upper airway obstruction. Had significant increase of inflammatory tissue on posterior wall of trachea. Was able to bypass with distal XLT. 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  5/12 repeat MBS on CIR - persisting dysphagia with no functional improvement. 6/20 PEG complicated by intra-abdominal bleeding requiring emergent re-exploration, back on vent. 6/4 tolerating TC; 6/7  trach changed back to cuffless and vent removed from room. Resumed PMV use. Swallow consult 6/7.  Subjective: PMSV in place throughout the study Assessment / Plan / Recommendation CHL IP  CLINICAL IMPRESSIONS 11/15/2020 Clinical Impression Pt was seen in radiology suite for modified barium swallow study. PMSV was in place throughout the study. Thickening of prevertebral tissue at the level of C4-5 continues to be noted and still impede complete epiglottic inversion, but this was slightly improved compared to the initial study on 09/29/20. Anterior laryngeal movement and pharyngeal constriction were reduced. Vallecular and posterior pharyngeal wall residue were noted. The prevertebral thickening facilitated penetration (PAS 3) of liquids secondary to spillage of posterior pharyngeal wall residue over the arytenoids and into the larynx and to residue which accumulated on the laryngeal surface of the epiglottis during incomplete epiglottic inversion. A right head turn posture resulted in aspiration (PAS 7) of nectar thick liquids via tsp. A chin tuck posture with left head turn and effortful swallow improved epiglottic inversion and reduced frequency of penetration with honey and nectar thick liquids. No laryngeal invasion was noted with 1/2 tsp boluses of puree. Pt's independent use of an intermittent cough and secondary swallows was effective in clearing penetration (PAS 3), but coughing was ineffective for episodes of aspiration. Use of a combination of these multiple strategies shows therapeutic promise, but SLP anticipates that these will be laborious for the pt. Pt's NPO status will be continued with allowance of occasional ice chips following oral care. SLP will follow for continued treatment and is hopeful that p.o. intake (likely snacks of modified consistencies initially) can be initiated with training on compensatory strategies. SLP Visit Diagnosis Dysphagia, pharyngeal phase (R13.13) Attention and concentration  deficit following -- Frontal lobe and executive function deficit following -- Impact on safety and function Moderate aspiration risk;Severe aspiration risk;Risk for inadequate nutrition/hydration   CHL IP TREATMENT RECOMMENDATION 11/15/2020 Treatment Recommendations Therapy as outlined in treatment plan below   Prognosis 11/15/2020 Prognosis for Safe Diet Advancement Fair Barriers to Reach Goals Severity of deficits;Time post onset Barriers/Prognosis Comment -- CHL IP DIET RECOMMENDATION 11/15/2020 SLP Diet Recommendations NPO;Alternative means - long-term Liquid Administration via -- Medication Administration Via alternative means Compensations -- Postural Changes --   CHL IP OTHER RECOMMENDATIONS 11/15/2020 Recommended Consults -- Oral Care Recommendations Oral care QID Other Recommendations --   CHL IP FOLLOW UP RECOMMENDATIONS 11/15/2020 Follow up Recommendations Inpatient Rehab   CHL IP FREQUENCY AND DURATION 11/15/2020 Speech Therapy Frequency (ACUTE ONLY) min 2x/week Treatment Duration 2 weeks      CHL IP ORAL PHASE 11/15/2020 Oral Phase WFL Oral - Pudding Teaspoon -- Oral - Pudding Cup -- Oral - Honey Teaspoon -- Oral - Honey Cup -- Oral - Nectar Teaspoon -- Oral - Nectar Cup -- Oral - Nectar Straw -- Oral - Thin Teaspoon -- Oral - Thin Cup -- Oral - Thin Straw -- Oral - Puree -- Oral - Mech Soft -- Oral - Regular -- Oral - Multi-Consistency -- Oral - Pill -- Oral Phase - Comment --  CHL IP PHARYNGEAL PHASE 11/15/2020 Pharyngeal Phase -- Pharyngeal- Pudding Teaspoon -- Pharyngeal -- Pharyngeal- Pudding Cup -- Pharyngeal -- Pharyngeal- Honey Teaspoon Reduced anterior laryngeal mobility;Reduced epiglottic inversion;Reduced airway/laryngeal closure;Pharyngeal residue - valleculae;Pharyngeal residue - posterior pharnyx;Reduced pharyngeal peristalsis Pharyngeal Material enters airway, remains ABOVE vocal cords and not ejected out Pharyngeal- Honey Cup Reduced anterior laryngeal mobility;Reduced epiglottic inversion;Reduced  airway/laryngeal closure;Pharyngeal residue - valleculae;Pharyngeal residue - posterior pharnyx;Reduced pharyngeal peristalsis Pharyngeal Material enters airway, remains ABOVE vocal cords and not ejected out Pharyngeal- Nectar Teaspoon Reduced anterior laryngeal mobility;Reduced epiglottic inversion;Reduced airway/laryngeal closure;Pharyngeal residue - valleculae;Pharyngeal residue - posterior pharnyx;Reduced pharyngeal peristalsis Pharyngeal -- Pharyngeal- Nectar Cup  Reduced anterior laryngeal mobility;Reduced epiglottic inversion;Reduced airway/laryngeal closure;Pharyngeal residue - valleculae;Pharyngeal residue - posterior pharnyx;Reduced pharyngeal peristalsis Pharyngeal -- Pharyngeal- Nectar Straw -- Pharyngeal -- Pharyngeal- Thin Teaspoon -- Pharyngeal -- Pharyngeal- Thin Cup -- Pharyngeal -- Pharyngeal- Thin Straw -- Pharyngeal -- Pharyngeal- Puree Reduced anterior laryngeal mobility;Reduced epiglottic inversion;Reduced airway/laryngeal closure;Pharyngeal residue - valleculae;Pharyngeal residue - posterior pharnyx;Reduced pharyngeal peristalsis Pharyngeal -- Pharyngeal- Mechanical Soft -- Pharyngeal -- Pharyngeal- Regular -- Pharyngeal -- Pharyngeal- Multi-consistency -- Pharyngeal -- Pharyngeal- Pill -- Pharyngeal -- Pharyngeal Comment --  CHL IP CERVICAL ESOPHAGEAL PHASE 11/15/2020 Cervical Esophageal Phase WFL Pudding Teaspoon -- Pudding Cup -- Honey Teaspoon -- Honey Cup -- Nectar Teaspoon -- Nectar Cup -- Nectar Straw -- Thin Teaspoon -- Thin Cup -- Thin Straw -- Puree -- Mechanical Soft -- Regular -- Multi-consistency -- Pill -- Cervical Esophageal Comment -- Shanika I. Hardin Negus, [place], [place] Office number [phone] Pager [place] 11/15/2020, 3:53 PM                The results of significant diagnostics from this hospitalization (including imaging, microbiology, ancillary and laboratory) are listed below for reference.     Microbiology: Recent  Results (from the past 240 hour(s))  Culture, Urine     Status: Abnormal   Collection Time: 11/12/20  3:10 AM   Specimen: Urine, Catheterized  Result Value Ref Range Status   Specimen Description URINE, CATHETERIZED  Final   Special Requests   Final    Normal Performed at [facility], [address] [place], [place] [address]    Culture >=100,000 COLONIES/mL PROTEUS MIRABILIS (A)  Final   Report Status 11/14/2020 FINAL  Final   Organism ID, Bacteria PROTEUS MIRABILIS (A)  Final      Susceptibility   Proteus mirabilis - MIC*    AMPICILLIN <=2 SENSITIVE Sensitive     CEFAZOLIN 8 SENSITIVE Sensitive     CEFEPIME <=0.12 SENSITIVE Sensitive     CEFTRIAXONE <=0.25 SENSITIVE Sensitive     CIPROFLOXACIN <=0.25 SENSITIVE Sensitive     GENTAMICIN 4 SENSITIVE Sensitive     IMIPENEM 8 INTERMEDIATE Intermediate     NITROFURANTOIN 128 RESISTANT Resistant     TRIMETH/SULFA <=20 SENSITIVE Sensitive     AMPICILLIN/SULBACTAM <=2 SENSITIVE Sensitive     PIP/TAZO <=4 SENSITIVE Sensitive     * >=100,000 COLONIES/mL PROTEUS MIRABILIS  Culture, Respiratory w Gram Stain     Status: None   Collection Time: 11/12/20  2:58 PM   Specimen: Tracheal Aspirate; Respiratory  Result Value Ref Range Status   Specimen Description TRACHEAL ASPIRATE  Final   Special Requests NONE  Final   Gram Stain   Final    MODERATE WBC PRESENT, PREDOMINANTLY PMN RARE GRAM POSITIVE COCCI RARE GRAM VARIABLE ROD    Culture   Final    RARE Normal respiratory flora-no Staph aureus or Pseudomonas seen Performed at [facility], [address], [place], [place] [address]    Report Status 11/14/2020 FINAL  Final     Labs: BNP (last 3 results) Recent Labs    01/04/20 1411 07/03/20 1654 11/08/20 0026  BNP 125.3* 118.4* 144.3*   Basic Metabolic Panel: Recent Labs  Lab 11/16/20 0052 11/17/20 0116 11/18/20 0958 11/19/20 0312 11/20/20 0405  NA 127* 126* 130* 131* 133*  K 4.2 4.4 4.2 3.9 3.7  CL 94* 92*  94* 96* 98  CO2 26 27 30 31 30   GLUCOSE 172* 173* 145* 147* 111*  BUN  31* 29* 23 21 21   CREATININE 0.92 0.92 0.82 0.73 0.69  CALCIUM 8.5* 8.4* 8.6* 8.5* 8.2*   Liver Function Tests: No results for input(s): AST, ALT, ALKPHOS, BILITOT, PROT, ALBUMIN in the last 168 hours. No results for input(s): LIPASE, AMYLASE in the last 168 hours. No results for input(s): AMMONIA in the last 168 hours. CBC: Recent Labs  Lab 11/14/20 0035 11/15/20 0207 11/16/20 0052 11/19/20 0312  WBC  --  13.0* 11.4* 8.8  HGB 9.0* 8.5* 8.8* 8.9*  HCT 27.7* 26.3* 26.5* 28.2*  MCV  --  95.3 95.0 96.2  PLT  --  362 308 310   Cardiac Enzymes: No results for input(s): CKTOTAL, CKMB, CKMBINDEX, TROPONINI in the last 168 hours. BNP: Invalid input(s): POCBNP CBG: Recent Labs  Lab 11/20/20 0001 11/20/20 0022 11/20/20 0321 11/20/20 0837 11/20/20 1134  GLUCAP 135* 135* 131* 138* 158*   D-Dimer No results for input(s): DDIMER in the last 72 hours. Hgb A1c No results for input(s): HGBA1C in the last 72 hours. Lipid Profile No results for input(s): CHOL, HDL, LDLCALC, TRIG, CHOLHDL, LDLDIRECT in the last 72 hours. Thyroid function studies No results for input(s): TSH, T4TOTAL, T3FREE, THYROIDAB in the last 72 hours.  Invalid input(s): FREET3 Anemia work up No results for input(s): VITAMINB12, FOLATE, FERRITIN, TIBC, IRON, RETICCTPCT in the last 72 hours. Urinalysis    Component Value Date/Time   COLORURINE YELLOW (A) 11/11/2020 2202   APPEARANCEUR CLOUDY (A) 11/11/2020 2202   LABSPEC RESULTS UNAVAILABLE DUE TO INTERFERING SUBSTANCE 11/11/2020 2202   PHURINE RESULTS UNAVAILABLE DUE TO INTERFERING SUBSTANCE 11/11/2020 2202   GLUCOSEU RESULTS UNAVAILABLE DUE TO INTERFERING SUBSTANCE (A) 11/11/2020 2202   GLUCOSEU NEGATIVE 03/05/2016 1203   HGBUR RESULTS UNAVAILABLE DUE TO INTERFERING SUBSTANCE (A) 11/11/2020 2202   BILIRUBINUR RESULTS UNAVAILABLE DUE TO INTERFERING SUBSTANCE (A) 11/11/2020 2202    KETONESUR RESULTS UNAVAILABLE DUE TO INTERFERING SUBSTANCE (A) 11/11/2020 2202   PROTEINUR RESULTS UNAVAILABLE DUE TO INTERFERING SUBSTANCE (A) 11/11/2020 2202   UROBILINOGEN 0.2 03/05/2016 1203   NITRITE RESULTS UNAVAILABLE DUE TO INTERFERING SUBSTANCE (A) 11/11/2020 2202   LEUKOCYTESUR RESULTS UNAVAILABLE DUE TO INTERFERING SUBSTANCE (A) 11/11/2020 2202   Sepsis Labs Invalid input(s): PROCALCITONIN,  WBC,  LACTICIDVEN Microbiology Recent Results (from the past 240 hour(s))  Culture, Urine     Status: Abnormal   Collection Time: 11/12/20  3:10 AM   Specimen: Urine, Catheterized  Result Value Ref Range Status   Specimen Description URINE, CATHETERIZED  Final   Special Requests   Final    Normal Performed at [facility], [address], [place], [place] [address]    Culture >=100,000 COLONIES/mL PROTEUS MIRABILIS (A)  Final   Report Status 11/14/2020 FINAL  Final   Organism ID, Bacteria PROTEUS MIRABILIS (A)  Final      Susceptibility   Proteus mirabilis - MIC*    AMPICILLIN <=2 SENSITIVE Sensitive     CEFAZOLIN 8 SENSITIVE Sensitive     CEFEPIME <=0.12 SENSITIVE Sensitive     CEFTRIAXONE <=0.25 SENSITIVE Sensitive     CIPROFLOXACIN <=0.25 SENSITIVE Sensitive     GENTAMICIN 4 SENSITIVE Sensitive     IMIPENEM 8 INTERMEDIATE Intermediate     NITROFURANTOIN 128 RESISTANT Resistant     TRIMETH/SULFA <=20 SENSITIVE Sensitive     AMPICILLIN/SULBACTAM <=2 SENSITIVE Sensitive     PIP/TAZO <=4 SENSITIVE Sensitive     * >=100,000 COLONIES/mL PROTEUS MIRABILIS  Culture, Respiratory w Gram Stain     Status: None  Collection Time: 11/12/20  2:58 PM   Specimen: Tracheal Aspirate; Respiratory  Result Value Ref Range Status   Specimen Description TRACHEAL ASPIRATE  Final   Special Requests NONE  Final   Gram Stain   Final    MODERATE WBC PRESENT, PREDOMINANTLY PMN RARE GRAM POSITIVE COCCI RARE GRAM VARIABLE ROD    Culture   Final    RARE Normal respiratory flora-no Staph aureus  or Pseudomonas seen Performed at [facility], [address], [place], [place] [address]    Report Status 11/14/2020 FINAL  Final     Time coordinating discharge in minutes: 70  SIGNED:   Debbe Odea, MD  [facility] 11/20/2020, 1:57 PM

## 2020-11-20 NOTE — Progress Notes (Signed)
Inpatient Rehab Admissions Coordinator:    Per SW, Pt.'s appeal for LTACH was overturned. I will not pursue CIR admit at this time.  Clemens Catholic, [place], [facility] Admissions Coordinator  [phone] ([place]) [phone] (office)

## 2020-11-20 NOTE — Progress Notes (Signed)
Pt has order for orthostatic VS. He refused due to being tired and unable to stand up.

## 2020-11-20 NOTE — Progress Notes (Signed)
Physical Therapy Treatment Patient Details Name: Devin [name] MRN: [id number] DOB: 04/14/1941 Today's Date: 11/20/2020    History of Present Illness Pt is a 80 yo male admitted from [place] 5/24 to ICU after PEG placement and hypotension with need for repeat exploration and evacuation. Pt remained on vent post op. Pt with complex PMHx recently admitted 09/10/20-10/06/20 after fall with T11 chance fx and L1 compression fx s/p PLIF T11-12 on 4/8. Pt with complex respiratory failure requiring trach 4/12. Pt then transferred to CIR 10/06/20; readmitted to [facility] acute care on 10/07/20 for low blood sugars. Other PMH includes HTN, gout, afib, ankylosing spondylitis, osteoporosis, prostate CA, DM2.    PT Comments    Pt admitted with above diagnosis. Pt was able to transfer to the chair but did leave SCDs in place to try and assist with orthostatic hypotension and it did seem to help as pt's BP did drop however pt did not have dizziness per pt.  See BPs below.  Pt currently with functional limitations due to balance and endurance deficits. Pt will benefit from skilled PT to increase their independence and safety with mobility to allow discharge to the venue listed below.     Orthostatic BPs  Supine 134/41, 79 bpm  Sitting 105/35, 88 bpm  Standing 67/26, 84 bpm  Standing after 3 min Unable to take BP as pt pivoted to chair   Once pt in chair, BP 130/46 with HR 79 bpm.  Note - left SCDs on entire treatment to see if this helped with pts BP/symptoms.  Pt did not report dizziness during session.    Follow Up Recommendations  CIR;Supervision for mobility/OOB     Equipment Recommendations  3in1 (PT);Rolling walker with 5" wheels    Recommendations for Other Services Rehab consult     Precautions / Restrictions Precautions Precautions: Fall;Back;Other (comment) Precaution Booklet Issued: No Precaution Comments: Trach collar, PEG, abdominal incision, TLSO, watch BP Required Braces or Orthoses: Spinal  Brace Spinal Brace: Thoracolumbosacral orthotic;Applied in sitting position;Other (comment) Spinal Brace Comments: Did not progress beyond EOB this date. TLSO not donned. Pt cant tolerate the brace. Restrictions Weight Bearing Restrictions: No    Mobility  Bed Mobility Overal bed mobility: Needs Assistance Bed Mobility: Rolling Rolling: Min assist Sidelying to sit: Min assist;HOB elevated     Sit to sidelying: Min assist;HOB elevated General bed mobility comments: Light min A for rolling R<>L    Transfers Overall transfer level: Needs assistance Equipment used: Rolling walker (2 wheeled) Transfers: Sit to/from Stand Sit to Stand: Min guard Stand pivot transfers: Min guard       General transfer comment: Guard assist only.  Took orthostatic VS and left the SCD's on pt to assist with BP stabilization. Pt was not dizzy but did have a drop in BP.  Was able to pivot to the chair and BP was 130/46 once in chair therefore left pt up with SCDs in place.  Ambulation/Gait Ambulation/Gait assistance: Min guard Gait Distance (Feet): 2 Feet Assistive device: Rolling walker (2 wheeled) Gait Pattern/deviations: Trunk flexed Gait velocity: Decreased   General Gait Details: Bed to chair only due to orthostatic BPs.   Stairs             Wheelchair Mobility    Modified Rankin (Stroke Patients Only)       Balance Overall balance assessment: Needs assistance Sitting-balance support: Feet supported;Bilateral upper extremity supported;No upper extremity supported Sitting balance-Leahy Scale: Fair Sitting balance - Comments: supervision sitting EOB  Standing balance support: Bilateral upper extremity supported;During functional activity Standing balance-Leahy Scale: Poor Standing balance comment: Reliant on UE support and external assist                            Cognition Arousal/Alertness: Awake/alert Behavior During Therapy: WFL for tasks  assessed/performed Overall Cognitive Status: Impaired/Different from baseline Area of Impairment: Memory;Following commands;Problem solving                     Memory: Decreased recall of precautions;Decreased short-term memory Following Commands: Follows one step commands consistently;Follows multi-step commands with increased time     Problem Solving: Difficulty sequencing;Requires verbal cues        Exercises General Exercises - Lower Extremity Ankle Circles/Pumps: AROM;Both;10 reps;Seated Long Arc Quad: AROM;Both;10 reps;Seated Hip Flexion/Marching: AROM;Strengthening;Both;10 reps;Seated    General Comments        Pertinent Vitals/Pain Pain Assessment: No/denies pain    Home Living                      Prior Function            PT Goals (current goals can now be found in the care plan section) Acute Rehab PT Goals Patient Stated Goal: get home Progress towards PT goals: Progressing toward goals    Frequency    Min 3X/week      PT Plan Current plan remains appropriate    Co-evaluation PT/OT/SLP Co-Evaluation/Treatment: Yes            AM-PAC PT "6 Clicks" Mobility   Outcome Measure  Help needed turning from your back to your side while in a flat bed without using bedrails?: A Little Help needed moving from lying on your back to sitting on the side of a flat bed without using bedrails?: A Little Help needed moving to and from a bed to a chair (including a wheelchair)?: A Little Help needed standing up from a chair using your arms (e.g., wheelchair or bedside chair)?: A Little Help needed to walk in hospital room?: A Lot Help needed climbing 3-5 steps with a railing? : Total 6 Click Score: 15    End of Session Equipment Utilized During Treatment: Oxygen Activity Tolerance: Treatment limited secondary to medical complications (Comment) (orthostatic hypotension) Patient left: with call bell/phone within reach;with family/visitor  present;in chair Nurse Communication: Mobility status PT Visit Diagnosis: Difficulty in walking, not elsewhere classified (R26.2);Unsteadiness on feet (R26.81);Other abnormalities of gait and mobility (R26.89);Muscle weakness (generalized) (M62.81) Pain - Right/Left: Right Pain - part of body:  (back)     Time: 1203-1219 PT Time Calculation (min) (ACUTE ONLY): 16 min  Charges:  $Therapeutic Activity: 8-22 mins                     Aldine Grainger M,PT Acute Rehab Services [phone] (pager)    Alvira Philips 11/20/2020, 2:12 PM

## 2020-11-21 DIAGNOSIS — C3492 Malignant neoplasm of unspecified part of left bronchus or lung: Secondary | ICD-10-CM | POA: Diagnosis not present

## 2020-11-21 DIAGNOSIS — I480 Paroxysmal atrial fibrillation: Secondary | ICD-10-CM | POA: Diagnosis not present

## 2020-11-21 DIAGNOSIS — J386 Stenosis of larynx: Secondary | ICD-10-CM | POA: Diagnosis not present

## 2020-11-21 DIAGNOSIS — J9621 Acute and chronic respiratory failure with hypoxia: Secondary | ICD-10-CM | POA: Diagnosis not present

## 2020-11-21 LAB — CBC WITH DIFFERENTIAL/PLATELET
Abs Immature Granulocytes: 0.16 10*3/uL — ABNORMAL HIGH (ref 0.00–0.07)
Basophils Absolute: 0.1 10*3/uL (ref 0.0–0.1)
Basophils Relative: 1 %
Eosinophils Absolute: 0.2 10*3/uL (ref 0.0–0.5)
Eosinophils Relative: 2 %
HCT: 31.8 % — ABNORMAL LOW (ref 39.0–52.0)
Hemoglobin: 9.8 g/dL — ABNORMAL LOW (ref 13.0–17.0)
Immature Granulocytes: 2 %
Lymphocytes Relative: 11 %
Lymphs Abs: 1.2 10*3/uL (ref 0.7–4.0)
MCH: 30.2 pg (ref 26.0–34.0)
MCHC: 30.8 g/dL (ref 30.0–36.0)
MCV: 97.8 fL (ref 80.0–100.0)
Monocytes Absolute: 0.6 10*3/uL (ref 0.1–1.0)
Monocytes Relative: 6 %
Neutro Abs: 8.3 10*3/uL — ABNORMAL HIGH (ref 1.7–7.7)
Neutrophils Relative %: 78 %
Platelets: 306 10*3/uL (ref 150–400)
RBC: 3.25 MIL/uL — ABNORMAL LOW (ref 4.22–5.81)
RDW: 15.1 % (ref 11.5–15.5)
WBC: 10.5 10*3/uL (ref 4.0–10.5)
nRBC: 0 % (ref 0.0–0.2)

## 2020-11-21 LAB — BASIC METABOLIC PANEL
Anion gap: 4 — ABNORMAL LOW (ref 5–15)
BUN: 23 mg/dL (ref 8–23)
CO2: 33 mmol/L — ABNORMAL HIGH (ref 22–32)
Calcium: 9 mg/dL (ref 8.9–10.3)
Chloride: 96 mmol/L — ABNORMAL LOW (ref 98–111)
Creatinine, Ser: 0.79 mg/dL (ref 0.61–1.24)
GFR, Estimated: >60 mL/min (ref >60)
Glucose, Bld: 130 mg/dL — ABNORMAL HIGH (ref 70–99)
Potassium: 4.2 mmol/L (ref 3.5–5.1)
Sodium: 133 mmol/L — ABNORMAL LOW (ref 135–145)

## 2020-11-21 NOTE — Consult Note (Signed)
[facility] [place]  Date of Service: 11/21/2020  PULMONARY CRITICAL CARE CONSULT   RAYMEL CULL  ACZ:[id number]  DOB: 01-05-1941   DOA: 11/20/2020  Referring Physician: Merton Border, MD  HPI: DONOVYN GUIDICE is a 80 y.o. male seen for follow up of Acute on Chronic Respiratory Failure.  Patient has multiple medical problems as outlined below including lung cancer status post lobectomy Crohn's disease thyroid cancer prostate cancer atrial fibrillation came into the hospital after having developed a fall and fracture.  The patient underwent surgery and fusion done hospital course was complicated with development of sepsis patient apparently had Klebsiella pneumonia.  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  The patient ended up having to have a tracheostomy.  He is now presenting to [place] for further management possibility of weaning.  Patient had a tracheostomy changed on 5 3 and subsequently was not able to be suctioned via the tracheostomy at that time he was found to have inflammatory tissue which required a change to an XLT trach  Review of Systems:  ROS performed and is unremarkable other than noted above.  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    Obesity (BMI 30-39.9)    Osteoporosis    Pt completed 5 years of fosamax in 2013      Prostate cancer with recurrence    Treated with prostatectomy with recurrence 2012 with Lupron  treatment now him    Psoriasis    Thyroid cancer ([place]) 1999   Treated with RAI and total thyroidectomy    Type II diabetes mellitus ([place])     Past Surgical History:  Procedure Laterality Date   ABDOMINAL EXPLORATION SURGERY     for small bowel obstruction   APPENDECTOMY  03/2007   BACK SURGERY     BASAL CELL CARCINOMA EXCISION  "several"   "head"   CARDIAC CATHETERIZATION  03/17/2003   CHOLECYSTECTOMY N/A 05/07/2016   Procedure: LAPAROSCOPIC CHOLECYSTECTOMY;  Surgeon: Fanny Skates, MD;  Location: [place];  Service: General;  Laterality: N/A;   COLON SURGERY  03/2007   Resection of cecum, appendix, terminal ileum (approximately/notes 10/10/2010   CYSTOSCOPY/URETEROSCOPY/HOLMIUM LASER/STENT PLACEMENT Right 07/03/2020   Procedure: CYSTOSCOPY/RETROGRADE/URETEROSCOPY REMOVAL OF BLADDER STONE;  Surgeon: Raynelle Bring, MD;  Location: [facility];  Service: Urology;  Laterality: Right;   DIRECT LARYNGOSCOPY N/A 10/04/2020   Procedure: DIRECT LARYNGOSCOPY, PHARYNGOSCOPY WITH BIOPSY;  Surgeon: [name], DO;  Location: [place] [facility];  Service: ENT;  Laterality: N/A;   ESOPHAGOSCOPY N/A 10/04/2020   Procedure: ESOPHAGOSCOPY WITH BIOPSY;  Surgeon: [name], DO;  Location: [place];  Service: ENT;  Laterality: N/A;   GASTROSTOMY N/A 10/31/2020   Procedure: INSERTION OF GASTROSTOMY TUBE;  Surgeon: Rolm Bookbinder, MD;  Location: [place];  Service: General;  Laterality: N/A;   [place]  LEVEL 1 N/A 09/15/2020   Procedure: REVISION OF THORACOLUMBAR FUSION, ADDITION OF CROSS-CONNECTORS;  Surgeon: Eustace Moore, MD;  Location: [place];  Service: Neurosurgery;  Laterality: N/A;   LAPAROSCOPIC CHOLECYSTECTOMY  05/07/2016   LAPAROSCOPIC LYSIS OF ADHESIONS  05/07/2016   LAPAROSCOPIC LYSIS OF ADHESIONS N/A 05/07/2016   Procedure: LAPAROSCOPIC LYSIS OF ADHESIONS TIMES ONE HOUR;  Surgeon: Fanny Skates, MD;  Location: [place];  Service: General;   Laterality: N/A;   LAPAROTOMY N/A 10/31/2020   Procedure: EXPLORATORY LAPAROTOMY WITH [place];  Surgeon: Rolm Bookbinder, MD;  Location: [place];  Service: General;  Laterality: N/A;   POSTERIOR FUSION THORACIC SPINE  02/08/2016   1. Posterior thoracic arthrodesis T7-T11 utilizing morcellized allograft, 2. Posterior thoracic segmental fixation T7-T11 utilizing nuvasive pedicle screws   PROSTATECTOMY  06/2001   w/bilateral pelvic lymph nose dissection/notes 10/24/2010   SPINAL FUSION  12/2014   Open reduction internal fixation of L1 [place] fracture with posterior fusion T10-L4 utilizing morcellized allograft and some local autograft, segmental instrumentation T10-L4 inclusive utilizing nuvasive pedicle screws/notes 12/16/2014   Stress Cardiolite  02/17/2003   THOROCOTOMY WITH LOBECTOMY  03/16/2015   Procedure: THOROCOTOMY WITH LOBECTOMY;  Surgeon: Ivin Poot, MD;  Location: [place];  Service: Thoracic;;   TONSILLECTOMY     TOTAL THYROIDECTOMY  1997   Venous Doppler  05/30/2004   VENTRAL HERNIA REPAIR  04/14/2008   VIDEO ASSISTED THORACOSCOPY Left 03/16/2015   Procedure: VIDEO ASSISTED THORACOSCOPY;  Surgeon: Ivin Poot, MD;  Location: [facility];  Service: Thoracic;  Laterality: Left;    Social History:    reports that he quit smoking about 30 years ago. His smoking use included cigarettes. He has a 60.00 pack-year smoking history. He has never used smokeless tobacco. He reports previous alcohol use. He reports that he does not use drugs.  Family History: Non-Contributory to the present illness  No Known Allergies  Medications: Reviewed on Rounds  Physical Exam:  Vitals: Temperature is 98.6 pulse 81 respiratory 18 blood pressure is 103/50 saturations 95%  Ventilator Settings off the ventilator currently on T collar FiO2 28%  General: Comfortable at this time Eyes: Grossly normal lids, irises & conjunctiva ENT: grossly tongue is normal Neck: no obvious mass Cardiovascular: S1-S2  normal no gallop rub Respiratory: Scattered rhonchi very coarse breath sound Abdomen: Soft and nontender Skin: no rash seen on limited exam Musculoskeletal: not rigid Psychiatric:unable to assess Neurologic: no seizure no involuntary movements         Labs on Admission:  Basic Metabolic Panel: Recent Labs  Lab 11/17/20 0116 11/18/20 0958 11/19/20 0312 11/20/20 0405 11/21/20 0511  NA 126* 130* 131* 133* 133*  K 4.4 4.2 3.9 3.7 4.2  CL 92* 94* 96* 98 96*  CO2 27 30 31 30  33*  GLUCOSE 173* 145* 147* 111* 130*  BUN 29* 23 21 21 23   CREATININE 0.92 0.82 0.73 0.69 0.79  CALCIUM 8.4* 8.6* 8.5* 8.2* 9.0    No results for input(s): PHART, PCO2ART, PO2ART, HCO3, O2SAT in the last 168 hours.  Liver Function Tests: No results for input(s): AST, ALT, ALKPHOS, BILITOT, PROT, ALBUMIN in the last 168 hours. No results for input(s): LIPASE, AMYLASE in the last 168 hours. No results for input(s): AMMONIA in the last 168 hours.  CBC: Recent Labs  Lab 11/15/20 0207 11/16/20 0052 11/19/20 0312 11/21/20 0511  WBC 13.0* 11.4* 8.8 10.5  NEUTROABS  --   --   --  8.3*  HGB 8.5* 8.8*  8.9* 9.8*  HCT 26.3* 26.5* 28.2* 31.8*  MCV 95.3 95.0 96.2 97.8  PLT 362 308 310 306    Cardiac Enzymes: No results for input(s): CKTOTAL, CKMB, CKMBINDEX, TROPONINI in the last 168 hours.  BNP (last 3 results) Recent Labs    01/04/20 1411 07/03/20 1654 11/08/20 0026  BNP 125.3* 118.4* 269.8*    ProBNP (last 3 results) No results for input(s): PROBNP in the last 8760 hours.   Radiological Exams on Admission: DG Abd 1 View  Result Date: 11/20/2020 CLINICAL DATA:  Peg tube placement EXAM: ABDOMEN - 1 VIEW COMPARISON:  10/25/2020 FINDINGS: Catheter tubing overlies the mid abdomen. There is enteric contrast within the stomach and proximal duodenum. No extraluminal contrast collections. No gross free intraperitoneal air. Nonobstructive bowel gas pattern. Extensive thoracolumbar fixation hardware with  chronically fractured fixation rods. IMPRESSION: Enteric contrast within the stomach and proximal duodenum. No extraluminal contrast collections. Electronically Signed   By: Davina Poke D.O.   On: 11/20/2020 21:42    Assessment/Plan Active Problems:   Adenocarcinoma of left lung, stage 1 (HCC)   Paroxysmal atrial fibrillation (HCC)   Acute on chronic respiratory failure with hypoxia (HCC)   Supraglottic stenosis   Acute on chronic respiratory failure hypoxia we will continue with T collar trials patient currently is on 28% FiO2 he had a complicated course at the other facility with subglottic and supraglottic stenosis requiring daily XLT tracheostomy.  Right now we are going to continue with the airway until we can get further assessment and evaluation we will also review the records from the previous admitting facility. Paroxysmal atrial fibrillation patient is on no rate control right now plan is going to be to continue with the current medical management. Supraglottic and subglottic stenosis we will continue to monitor right now tracheostomy will remain in place Severe sepsis this has resolved we will continue with supportive care has been treated with antibiotics. Adenocarcinoma of the lung stage I patient has been status post resection we will continue to monitor closely.  I have personally seen and evaluated the patient, evaluated laboratory and imaging results, formulated the assessment and plan and placed orders. The Patient requires high complexity decision making with multiple systems involvement.  Case was discussed on Rounds with the Respiratory Therapy Director and the Respiratory staff Time Spent 26mnutes  Joquan Lotz A Demico Ploch, MD FMemorial Hermann Memorial Village Surgery CenterPulmonary Critical Care Medicine Sleep Medicine

## 2020-11-22 ENCOUNTER — Other Ambulatory Visit (HOSPITAL_COMMUNITY): Payer: Self-pay

## 2020-11-22 ENCOUNTER — Encounter: Payer: Self-pay | Admitting: Internal Medicine

## 2020-11-22 ENCOUNTER — Other Ambulatory Visit: Payer: Self-pay

## 2020-11-22 ENCOUNTER — Emergency Department (HOSPITAL_COMMUNITY)
Admission: EM | Admit: 2020-11-22 | Discharge: 2020-11-23 | Disposition: A | Discharge status: Home / Self Care | Payer: Medicare Other | Attending: Emergency Medicine | Admitting: Emergency Medicine

## 2020-11-22 DIAGNOSIS — I10 Essential (primary) hypertension: Secondary | ICD-10-CM | POA: Insufficient documentation

## 2020-11-22 DIAGNOSIS — Z85118 Personal history of other malignant neoplasm of bronchus and lung: Secondary | ICD-10-CM | POA: Insufficient documentation

## 2020-11-22 DIAGNOSIS — Z79899 Other long term (current) drug therapy: Secondary | ICD-10-CM | POA: Diagnosis not present

## 2020-11-22 DIAGNOSIS — Z87891 Personal history of nicotine dependence: Secondary | ICD-10-CM | POA: Diagnosis not present

## 2020-11-22 DIAGNOSIS — Z7951 Long term (current) use of inhaled steroids: Secondary | ICD-10-CM | POA: Insufficient documentation

## 2020-11-22 DIAGNOSIS — J9621 Acute and chronic respiratory failure with hypoxia: Secondary | ICD-10-CM | POA: Insufficient documentation

## 2020-11-22 DIAGNOSIS — Z85828 Personal history of other malignant neoplasm of skin: Secondary | ICD-10-CM | POA: Diagnosis not present

## 2020-11-22 DIAGNOSIS — Z794 Long term (current) use of insulin: Secondary | ICD-10-CM | POA: Insufficient documentation

## 2020-11-22 DIAGNOSIS — Z85850 Personal history of malignant neoplasm of thyroid: Secondary | ICD-10-CM | POA: Diagnosis not present

## 2020-11-22 DIAGNOSIS — E785 Hyperlipidemia, unspecified: Secondary | ICD-10-CM | POA: Diagnosis not present

## 2020-11-22 DIAGNOSIS — E1169 Type 2 diabetes mellitus with other specified complication: Secondary | ICD-10-CM | POA: Insufficient documentation

## 2020-11-22 DIAGNOSIS — Z931 Gastrostomy status: Secondary | ICD-10-CM

## 2020-11-22 DIAGNOSIS — C3492 Malignant neoplasm of unspecified part of left bronchus or lung: Secondary | ICD-10-CM

## 2020-11-22 DIAGNOSIS — T85528A Displacement of other gastrointestinal prosthetic devices, implants and grafts, initial encounter: Secondary | ICD-10-CM

## 2020-11-22 DIAGNOSIS — Z8546 Personal history of malignant neoplasm of prostate: Secondary | ICD-10-CM | POA: Insufficient documentation

## 2020-11-22 DIAGNOSIS — J386 Stenosis of larynx: Secondary | ICD-10-CM

## 2020-11-22 DIAGNOSIS — K9423 Gastrostomy malfunction: Secondary | ICD-10-CM | POA: Insufficient documentation

## 2020-11-22 DIAGNOSIS — E890 Postprocedural hypothyroidism: Secondary | ICD-10-CM | POA: Diagnosis not present

## 2020-11-22 DIAGNOSIS — R1310 Dysphagia, unspecified: Secondary | ICD-10-CM | POA: Insufficient documentation

## 2020-11-22 DIAGNOSIS — I480 Paroxysmal atrial fibrillation: Secondary | ICD-10-CM | POA: Insufficient documentation

## 2020-11-22 NOTE — ED Triage Notes (Signed)
Patient arrives from [place] floor for concern of g-tube problem. RN states patient's g-tube has been leaking for 2 days, g-tube is now out and the facility was concerned that part of the g-tube is still in his abdomen. Reports g-tube was placed by surgery due to difficulty in IR. Patient is trach dependent and was recently weaned off of the ventilator per nursing report.

## 2020-11-22 NOTE — Progress Notes (Signed)
Devin [name]  PROGRESS NOTE     Devin [name]  PYK:[id number]  DOB: July 24, 1940   DOA: 11/20/2020  Referring Physician: Merton Border, MD  HPI: Devin [name] is a 80 y.o. male being followed for ventilator/airway/oxygen weaning Acute on Chronic Respiratory Failure.  Patient is resting comfortably right now without distress at this time has been on T collar 30% using the PMV during the daytime not requiring any oxygen  Medications: Reviewed on Rounds  Physical Exam:  Vitals: Temperature is 96.2 pulse 64 respiratory rate is 17 blood pressure is 115/59 saturations 100%  Ventilator Settings on T collar FiO2 right now on room air while using the PMV  General: Comfortable at this time Neck: supple Cardiovascular: no malignant arrhythmias Respiratory: Scattered rhonchi coarse breath sounds Skin: no rash seen on limited exam Musculoskeletal: No gross abnormality Psychiatric:unable to assess Neurologic:no involuntary movements         Lab Data:   Basic Metabolic Panel: Recent Labs  Lab 11/17/20 0116 11/18/20 0958 11/19/20 0312 11/20/20 0405 11/21/20 0511  NA 126* 130* 131* 133* 133*  K 4.4 4.2 3.9 3.7 4.2  CL 92* 94* 96* 98 96*  CO2 27 30 31 30  33*  GLUCOSE 173* 145* 147* 111* 130*  BUN 29* 23 21 21 23   CREATININE 0.92 0.82 0.73 0.69 0.79  CALCIUM 8.4* 8.6* 8.5* 8.2* 9.0    ABG: No results for input(s): PHART, PCO2ART, PO2ART, HCO3, O2SAT in the last 168 hours.  Liver Function Tests: No results for input(s): AST, ALT, ALKPHOS, BILITOT, PROT, ALBUMIN in the last 168 hours. No results for input(s): LIPASE, AMYLASE in the last 168 hours. No results for input(s): AMMONIA in the last 168 hours.  CBC: Recent Labs  Lab 11/16/20 0052 11/19/20 0312 11/21/20 0511  WBC 11.4* 8.8 10.5  NEUTROABS  --   --  8.3*  HGB 8.8* 8.9* 9.8*  HCT 26.5* 28.2* 31.8*  MCV 95.0 96.2 97.8  PLT  308 310 306    Cardiac Enzymes: No results for input(s): CKTOTAL, CKMB, CKMBINDEX, TROPONINI in the last 168 hours.  BNP (last 3 results) Recent Labs    01/04/20 1411 07/03/20 1654 11/08/20 0026  BNP 125.3* 118.4* 269.8*    ProBNP (last 3 results) No results for input(s): PROBNP in the last 8760 hours.  Radiological Exams: DG Abd 1 View  Result Date: 11/20/2020 CLINICAL DATA:  Peg tube placement EXAM: ABDOMEN - 1 VIEW COMPARISON:  10/25/2020 FINDINGS: Catheter tubing overlies the mid abdomen. There is enteric contrast within the stomach and proximal duodenum. No extraluminal contrast collections. No gross free intraperitoneal air. Nonobstructive bowel gas pattern. Extensive thoracolumbar fixation hardware with chronically fractured fixation rods. IMPRESSION: Enteric contrast within the stomach and proximal duodenum. No extraluminal contrast collections. Electronically Signed   By: Davina Poke D.O.   On: 11/20/2020 21:42    Assessment/Plan Active Problems:   Adenocarcinoma of left lung, stage 1 (HCC)   Paroxysmal atrial fibrillation (HCC)   Acute on chronic respiratory failure with hypoxia (HCC)   Supraglottic stenosis   Acute on chronic respiratory failure hypoxia with slowly weaning patient is doing fine with the T-bar plan is going to be tried with the capping trials soon continue to monitor closely. Paroxysmal atrial fibrillation) rate is controlled we will continue to monitor closely.  Continue with medical management Supraglottic and subglottic stenosis along with tracheomalacia this was the main reason for the tracheostomy  to be done.  We will have to determine whether the patient is going to be able to be weaned further on last evaluation patient still had significant inflammatory process going on in the posterior aspect of the tracheostomy so I think this may be a slow process Adenocarcinoma left lung status post upper lobectomy will need follow-up after  discharge   I have personally seen and evaluated the patient, evaluated laboratory and imaging results, formulated the assessment and plan and placed orders. The Patient requires high complexity decision making with multiple systems involvement.  Rounds were done with the Respiratory Therapy Director and Staff therapists and discussed with nursing staff also.  Allyne Gee, MD [facility] Sleep Medicine

## 2020-11-23 ENCOUNTER — Emergency Department (HOSPITAL_COMMUNITY): Payer: Medicare Other

## 2020-11-23 ENCOUNTER — Inpatient Hospital Stay
Admission: AD | Admit: 2020-11-23 | Discharge: 2021-03-10 | Disposition: E | Payer: Medicare Other | Source: Other Acute Inpatient Hospital | Attending: Internal Medicine | Admitting: Internal Medicine

## 2020-11-23 DIAGNOSIS — R109 Unspecified abdominal pain: Secondary | ICD-10-CM

## 2020-11-23 DIAGNOSIS — R0602 Shortness of breath: Secondary | ICD-10-CM

## 2020-11-23 DIAGNOSIS — J386 Stenosis of larynx: Secondary | ICD-10-CM | POA: Diagnosis present

## 2020-11-23 DIAGNOSIS — Z9889 Other specified postprocedural states: Secondary | ICD-10-CM

## 2020-11-23 DIAGNOSIS — D72829 Elevated white blood cell count, unspecified: Secondary | ICD-10-CM

## 2020-11-23 DIAGNOSIS — R5383 Other fatigue: Secondary | ICD-10-CM

## 2020-11-23 DIAGNOSIS — J9621 Acute and chronic respiratory failure with hypoxia: Secondary | ICD-10-CM | POA: Diagnosis present

## 2020-11-23 DIAGNOSIS — J189 Pneumonia, unspecified organism: Secondary | ICD-10-CM

## 2020-11-23 DIAGNOSIS — C3492 Malignant neoplasm of unspecified part of left bronchus or lung: Secondary | ICD-10-CM | POA: Diagnosis present

## 2020-11-23 DIAGNOSIS — R1110 Vomiting, unspecified: Secondary | ICD-10-CM

## 2020-11-23 DIAGNOSIS — J90 Pleural effusion, not elsewhere classified: Secondary | ICD-10-CM

## 2020-11-23 DIAGNOSIS — J942 Hemothorax: Secondary | ICD-10-CM

## 2020-11-23 DIAGNOSIS — J9690 Respiratory failure, unspecified, unspecified whether with hypoxia or hypercapnia: Secondary | ICD-10-CM

## 2020-11-23 DIAGNOSIS — R0989 Other specified symptoms and signs involving the circulatory and respiratory systems: Secondary | ICD-10-CM

## 2020-11-23 DIAGNOSIS — G4733 Obstructive sleep apnea (adult) (pediatric): Secondary | ICD-10-CM

## 2020-11-23 DIAGNOSIS — K5900 Constipation, unspecified: Secondary | ICD-10-CM

## 2020-11-23 DIAGNOSIS — R0902 Hypoxemia: Secondary | ICD-10-CM

## 2020-11-23 DIAGNOSIS — I4891 Unspecified atrial fibrillation: Secondary | ICD-10-CM | POA: Diagnosis present

## 2020-11-23 HISTORY — PX: IR REPLC GASTRO/COLONIC TUBE PERCUT W/FLUORO: IMG2333

## 2020-11-23 LAB — CBG MONITORING, ED
Glucose-Capillary: 79 mg/dL (ref 70–99)
Glucose-Capillary: 78 mg/dL (ref 70–99)

## 2020-11-23 MED ORDER — MIDAZOLAM HCL 2 MG/2ML IJ SOLN
INTRAMUSCULAR | Status: AC
  Filled 2020-11-23: qty 4

## 2020-11-23 MED ORDER — MIDAZOLAM HCL 2 MG/2ML IJ SOLN
INTRAMUSCULAR | Status: AC | PRN
  Administered 2020-11-23: 1 mg via INTRAVENOUS

## 2020-11-23 MED ORDER — LIDOCAINE VISCOUS HCL 2 % MT SOLN
OROMUCOSAL | Status: AC
  Filled 2020-11-23: qty 15

## 2020-11-23 MED ORDER — FENTANYL CITRATE (PF) 100 MCG/2ML IJ SOLN
INTRAMUSCULAR | Status: AC | PRN
  Administered 2020-11-23: 50 ug via INTRAVENOUS

## 2020-11-23 MED ORDER — FENTANYL CITRATE (PF) 100 MCG/2ML IJ SOLN
INTRAMUSCULAR | Status: AC
  Filled 2020-11-23: qty 4

## 2020-11-23 MED ORDER — DIATRIZOATE MEGLUMINE & SODIUM 66-10 % PO SOLN
ORAL | Status: AC
  Administered 2020-11-23: 30 mL via GASTROSTOMY
  Filled 2020-11-23: qty 30

## 2020-11-23 MED ORDER — LIDOCAINE HCL (PF) 1 % IJ SOLN
INTRAMUSCULAR | Status: AC
  Filled 2020-11-23: qty 30

## 2020-11-23 MED ORDER — LIDOCAINE HCL URETHRAL/MUCOSAL 2 % EX GEL
1.0000 "application " | Freq: Once | CUTANEOUS | Status: AC
  Administered 2020-11-23: 1 via TOPICAL
  Filled 2020-11-23: qty 11

## 2020-11-23 MED ORDER — IOHEXOL 300 MG/ML  SOLN
50.0000 mL | Freq: Once | INTRAMUSCULAR | Status: AC | PRN
  Administered 2020-11-23: 10 mL

## 2020-11-23 NOTE — Progress Notes (Signed)
Subjective Pt known to our service - had open g-tube placement 10/31/20 by Dr. Donne Hazel followed by takeback later that day for bleeding.  He has a history of a rather complex surgical history including mesh incisional hernia repair.  His daughter is at bedside.  They report that his feeding tube appeared to have come out in the last day or 2.  She states that feeds were being continued and appeared to just be leaking around the tube.  He was sent to the ER for further evaluation.  He denies any complaints at this time.  He denies any abdominal pain.  He has had some thin green fluid coming from his tube site.  Dr. Dina Rich was able to place a small caliber Foley catheter to secure the tract.  I attempted to place a feeding tube in this tract but it appears that the tract is somewhat narrowed at this point and were unable to successfully pass the tube.  The Foley catheter remains in place and the tract to maintain patency.  Objective: Vital signs in last 24 hours: Temp:  [98.1 F (36.7 C)] 98.1 F (36.7 C) (06/15 2323) Pulse Rate:  [66-78] 73 (06/16 0215) Resp:  [17-18] 18 (06/16 0215) BP: (135-142)/(59-95) 135/88 (06/16 0215) SpO2:  [98 %-100 %] 98 % (06/16 0215) Weight:  [82.8 kg] 82.8 kg (06/15 2323)    Intake/Output from previous day: No intake/output data recorded. Intake/Output this shift: No intake/output data recorded.  Gen: NAD, comfortable CV: RRR Pulm: Normal work of breathing Abd: Soft, NT/ND Ext: SCDs in place  Lab Results: CBC  Recent Labs    11/21/20 0511  WBC 10.5  HGB 9.8*  HCT 31.8*  PLT 306   BMET Recent Labs    11/20/20 0405 11/21/20 0511  NA 133* 133*  K 3.7 4.2  CL 98 96*  CO2 30 33*  GLUCOSE 111* 130*  BUN 21 23  CREATININE 0.69 0.79  CALCIUM 8.2* 9.0   PT/INR No results for input(s): LABPROT, INR in the last 72 hours. ABG No results for input(s): PHART, HCO3 in the last 72 hours.  Invalid input(s): PCO2,  PO2  Studies/Results:  Anti-infectives: Anti-infectives (From admission, onward)    None        Assessment/Plan: Patient Active Problem List   Diagnosis Date Noted   Paroxysmal atrial fibrillation ([place]) 11/22/2020   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/22/2020   Acute on chronic respiratory failure with hypoxia ([place]) 11/22/2020   Primary orthostatic hypotension 11/20/2020   Insomnia 11/20/2020   Pressure injury of skin 11/16/2020   Postoperative hypovolemic shock 11/15/2020   Shock ([place]) 10/31/2020   Sleep disturbance    Acute blood loss anemia    Controlled type 2 diabetes mellitus with hyperglycemia, with long-term current use of insulin ([name])    Dysphagia    Hyponatremia    Nasogastric tube present    Status post tracheostomy ([place])    Urinary retention    Debility 10/12/2020   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[facility])    Obstruction of upper airway due to foreign body    Hypoglycemia 10/07/2020   Thoracic vertebral fracture ([place]) 10/06/2020   Tracheostomy dependence (HCC)    Atrial fibrillation ([place])    History of cancer    Diabetes mellitus type 2 in nonobese [facility])    Chronic obstructive pulmonary disease (HCC)    Ineffective airway clearance    Acute encephalopathy    Status post thoracic spinal fusion 09/15/2020   Left rib fracture 09/10/2020  Hemothorax on left 09/10/2020   Acute on chronic respiratory failure with hypoxia and hypercapnia ([place]) 09/10/2020   S/P cystoscopy 07/03/2020   Respiratory failure ([place]) 07/03/2020   Acute on chronic respiratory failure with hypoxia ([place]) 07/03/2020   Paroxysmal atrial fibrillation ([place]) 03/30/2020   Secondary hypercoagulable state ([place]) 03/30/2020   Orthostatic hypotension 02/07/2020   Dyspnea 01/17/2020   NSTEMI (non-ST elevated myocardial infarction) ([place]) 01/04/2020   Crohn's ileocolitis ([place]) 02/21/2017   Hyperlipidemia 02/21/2017   Hyperuricemia 02/21/2017   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   Thyroid cancer ([place])  02/21/2017   Cholecystitis with cholelithiasis 05/07/2016   Cholecystitis 03/14/2016   Abdominal pain 03/05/2016   Malnutrition of moderate degree 02/10/2016   Thoracic compression fracture ([place]) 02/08/2016   Back pain 01/26/2016   Hip pain 01/26/2016   S/P lobectomy of lung 03/16/2015   Adenocarcinoma of left lung, stage 1 ([place]) 03/10/2015   Diabetic neuropathy (HCC)    Ankylosing spondylitis (HCC)    Obesity (BMI 30-39.9)    Crohn's disease ([place])    Lung nodule 02/23/2015   HIstory of basal cell cancer of face    History of thyroid cancer    Postsurgical hypothyroidism    Gout 12/29/2006   Essential hypertension 12/29/2006   Osteoporosis 12/29/2006   DM (diabetes mellitus), type 2 with renal complications ([place])    Prostate cancer with recurrence      -Discussed with Dr. Dina Rich - she plans to hold him in ED and we can consult with IR for tube exchange under fluoroscopy in AM -We will follow while here as well   LOS: 0 days   Nadeen Landau, MD [facility] Use [email or web address] to contact on call provider

## 2020-11-23 NOTE — Procedures (Signed)
Interventional Radiology Procedure Note  Procedure: G tube re-insertion  Indication: Dislodged G tube  Findings: Please refer to procedural dictation for full description.  Complications: None  EBL: < 10 mL  Miachel Roux, MD [phone]

## 2020-11-23 NOTE — ED Notes (Signed)
Pt transported to IR 

## 2020-11-23 NOTE — Consult Note (Signed)
Chief Complaint: Patient was seen in consultation today for dysphagia, gastrostomy tube dislodged  Referring Physician(s): Dr. Nadeen Landau  Supervising Physician: Mir, Sharen Heck  Patient Status: [facility] - ED  History of Present Illness: Devin [name] is a 80 y.o. male with past medical history of a-fib, Crohn's disease s/p extensive bowel resection, HTN, DM II, s/p recent prolonged hospitalization after fall at home resulting in compression fracture requiring T11-T12 fusion.  He developed sepsis related to Klebsiella pneumonia and required intubation with trach placement. IR was consulted 10/25/20 for possible percutaneous gastrostomy tube placement, however due to the placement of surgical mesh over the stomach was not a candidate for image-guided placement.  He underwent open placement in the OR 08/23/38 which was complicated by post-procedure bleeding requiring re-exploration.  Ultimately the tube was secured, hemostasis was achieved and patient was able to discharge to [facility].  Per report, patient developed leakage from around his G-tube site several days ago, tube was found dislodged yesterday evening.  He was sent to the ED for evaluation and replacement.  Surgery assessed the patient and attempted replacement however unsuccessful. A small, 12 Fr foley was able to be placed in the tract. IR consulted for replacement.   PA to bedside for assessment. Patient accompanied by daughter who confirms the above history.  Patient has been NPO without TFs.  Small foley (12Fr) found in tract.  Trach in place.  Agreeable to proceed.    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    Obesity (BMI 30-39.9)    Osteoporosis    Pt completed 5 years of fosamax in 2013      Paroxysmal atrial fibrillation [facility])    Prostate cancer with recurrence    Treated with prostatectomy with recurrence 2012 with Lupron treatment now him    Psoriasis    Supraglottic stenosis    Thyroid cancer ([place]) 1999   Treated with RAI and total thyroidectomy    Type II diabetes mellitus ([place])     Past Surgical History:  Procedure Laterality Date   ABDOMINAL EXPLORATION SURGERY     for small bowel obstruction   APPENDECTOMY  03/2007   BACK SURGERY     BASAL CELL CARCINOMA EXCISION  "several"   "head"   CARDIAC CATHETERIZATION  03/17/2003   CHOLECYSTECTOMY N/A 05/07/2016   Procedure: LAPAROSCOPIC CHOLECYSTECTOMY;  Surgeon: Fanny Skates, MD;  Location: [place];  Service: General;  Laterality: N/A;   COLON SURGERY  03/2007   Resection of cecum, appendix, terminal ileum (approximately/notes 10/10/2010   CYSTOSCOPY/URETEROSCOPY/HOLMIUM LASER/STENT PLACEMENT Right 07/03/2020   Procedure: CYSTOSCOPY/RETROGRADE/URETEROSCOPY REMOVAL OF BLADDER STONE;  Surgeon: Raynelle Bring, MD;  [facility];  Service: Urology;  Laterality: Right;   DIRECT LARYNGOSCOPY N/A 10/04/2020   Procedure: DIRECT LARYNGOSCOPY, PHARYNGOSCOPY WITH BIOPSY;  Surgeon: [name], DO;  Location: [place];  Service: ENT;  Laterality: N/A;   ESOPHAGOSCOPY N/A 10/04/2020   Procedure: ESOPHAGOSCOPY WITH  BIOPSY;  Surgeon: [name], DO;  Location: [place] [facility];  Service: ENT;  Laterality: N/A;   GASTROSTOMY N/A 10/31/2020   Procedure: INSERTION OF GASTROSTOMY TUBE;  Surgeon: Rolm Bookbinder, MD;  Location: [place];  Service: General;  Laterality: N/A;   HERNIA REPAIR     LAMINECTOMY WITH POSTERIOR LATERAL ARTHRODESIS LEVEL 1 N/A 09/15/2020   Procedure: REVISION OF THORACOLUMBAR  FUSION, ADDITION OF CROSS-CONNECTORS;  Surgeon: Eustace Moore, MD;  Location: [place];  Service: Neurosurgery;  Laterality: N/A;   LAPAROSCOPIC CHOLECYSTECTOMY  05/07/2016   LAPAROSCOPIC LYSIS OF ADHESIONS  05/07/2016   LAPAROSCOPIC LYSIS OF ADHESIONS N/A 05/07/2016   Procedure: LAPAROSCOPIC LYSIS OF ADHESIONS TIMES ONE HOUR;  Surgeon: Fanny Skates, MD;  Location: [place];  Service: General;  Laterality: N/A;   LAPAROTOMY N/A 10/31/2020   Procedure: EXPLORATORY LAPAROTOMY WITH Pinch;  Surgeon: Rolm Bookbinder, MD;  Location: [place];  Service: General;  Laterality: N/A;   POSTERIOR FUSION THORACIC SPINE  02/08/2016   1. Posterior thoracic arthrodesis T7-T11 utilizing morcellized allograft, 2. Posterior thoracic segmental fixation T7-T11 utilizing nuvasive pedicle screws   PROSTATECTOMY  06/2001   w/bilateral pelvic lymph nose dissection/notes 10/24/2010   SPINAL FUSION  12/2014   Open reduction internal fixation of L1 Chance fracture with posterior fusion T10-L4 utilizing morcellized allograft and some local autograft, segmental instrumentation T10-L4 inclusive utilizing nuvasive pedicle screws/notes 12/16/2014   Stress Cardiolite  02/17/2003   THOROCOTOMY WITH LOBECTOMY  03/16/2015   Procedure: THOROCOTOMY WITH LOBECTOMY;  Surgeon: Ivin Poot, MD;  Location: [place];  Service: Thoracic;;   TONSILLECTOMY     TOTAL THYROIDECTOMY  1997   Venous Doppler  05/30/2004   VENTRAL HERNIA REPAIR  04/14/2008   VIDEO ASSISTED THORACOSCOPY Left 03/16/2015   Procedure: VIDEO ASSISTED THORACOSCOPY;  Surgeon: Ivin Poot, MD;  Location: [place];  Service: Thoracic;  Laterality: Left;    Allergies: Patient has no known allergies.  Medications: Prior to Admission medications   Medication Sig Start Date End Date Taking? Authorizing Provider  acetaminophen (TYLENOL) 325 MG tablet Place 2 tablets (650 mg total) into feeding tube every 6 (six) hours as needed for mild pain. 11/20/20   Debbe Odea, MD   ALPRAZolam Duanne Moron) 0.25 MG tablet Take 0.5 tablets (0.125 mg total) by mouth 2 (two) times daily as needed for anxiety (use only for severe anxiety please). 11/20/20   Debbe Odea, MD  arformoterol (BROVANA) 15 MCG/2ML NEBU Take 2 mLs (15 mcg total) by nebulization 2 (two) times daily. 11/20/20   Debbe Odea, MD  bethanechol (URECHOLINE) 10 MG tablet Place 1 tablet (10 mg total) into feeding tube 4 (four) times daily. 11/20/20   Debbe Odea, MD  budesonide (PULMICORT) 0.5 MG/2ML nebulizer solution Take 2 mLs (0.5 mg total) by nebulization 2 (two) times daily. 10/06/20   Terrilee Croak, MD  cholestyramine (QUESTRAN) 4 g packet Place 1 packet (4 g total) into feeding tube 2 (two) times daily before lunch and supper. 10/06/20   Terrilee Croak, MD  diltiazem (CARDIZEM) 10 mg/ml oral suspension Place 3 mLs (30 mg total) into feeding tube every 6 (six) hours. 10/06/20   Terrilee Croak, MD  diphenoxylate-atropine (LOMOTIL) 2.5-0.025 MG tablet Take 1 tablet by mouth daily as needed for diarrhea or loose stools (after a watery BM). 11/20/20   Debbe Odea, MD  insulin aspart (NOVOLOG) 100 UNIT/ML injection Inject 0-9 Units into the skin every 4 (four) hours. 11/20/20   Debbe Odea, MD  insulin glargine (  LANTUS) 100 UNIT/ML injection Inject 0.12 mLs (12 Units total) into the skin at bedtime. 11/20/20   Debbe Odea, MD  ipratropium-albuterol (DUONEB) 0.5-2.5 (3) MG/3ML SOLN Take 3 mLs by nebulization every 4 (four) hours as needed. 11/20/20   Debbe Odea, MD  levothyroxine (SYNTHROID) 150 MCG tablet Place 1 tablet (150 mcg total) into feeding tube daily at 6 (six) AM. 11/21/20   Debbe Odea, MD  Multiple Vitamin (MULTIVITAMIN WITH MINERALS) TABS tablet Place 1 tablet into feeding tube daily. 11/21/20   Debbe Odea, MD  Nutritional Supplements (FEEDING SUPPLEMENT, PROSOURCE TF,) liquid Place 45 mLs into feeding tube 3 (three) times daily. 11/20/20   Debbe Odea, MD  Nutritional Supplements (FEEDING SUPPLEMENT,  VITAL AF 1.2 CAL,) LIQD Place 1,000 mLs into feeding tube continuous. 11/20/20   Debbe Odea, MD  Nystatin (GERHARDT'S BUTT CREAM) CREA Apply 1 application topically 2 (two) times daily. 11/20/20   Debbe Odea, MD  oxyCODONE (OXY IR/ROXICODONE) 5 MG immediate release tablet Place 1 tablet (5 mg total) into feeding tube every 4 (four) hours as needed for moderate pain. 11/20/20   Debbe Odea, MD  PARoxetine (PAXIL) 10 MG tablet Place 1 tablet (10 mg total) into feeding tube daily. 11/21/20   Debbe Odea, MD  sodium chloride 0.9 % infusion Inject 75 mLs into the vein continuous. 11/20/20   Debbe Odea, MD  tamsulosin (FLOMAX) 0.4 MG CAPS capsule Take 1 capsule (0.4 mg total) by mouth daily. 10/12/20   Donne Hazel, MD  traZODone (DESYREL) 100 MG tablet Place 1 tablet (100 mg total) into feeding tube at bedtime. 11/20/20   Debbe Odea, MD     Family History  Problem Relation Age of Onset   CAD Mother    Cancer Neg Hx        No cancer in the patient's immediate family, except of course for the patient himself, as noted    Social History   Socioeconomic History   Marital status: Married    Spouse name: Not on file   Number of children: Not on file   Years of education: Not on file   Highest education level: Not on file  Occupational History   Occupation: Retired  Tobacco Use   Smoking status: Former    Packs/day: 2.00    Years: 30.00    Pack years: 60.00    Types: Cigarettes    Quit date: 12/26/1989    Years since quitting: 30.9   Smokeless tobacco: Never  Vaping Use   Vaping Use: Never used  Substance and Sexual Activity   Alcohol use: Not Currently    Alcohol/week: 0.0 standard drinks   Drug use: No   Sexual activity: Never  Other Topics Concern   Not on file  Social History Narrative   Retired Development worker, international aid   Social Determinants of Radio broadcast assistant Strain: Not on file  Food Insecurity: Not on file  Transportation Needs: Not on file  Physical Activity:  Not on file  Stress: Not on file  Social Connections: Not on file     Review of Systems: A 12 point ROS discussed and pertinent positives are indicated in the HPI above.  All other systems are negative.  Review of Systems  Constitutional:  Negative for fatigue.  Respiratory:  Negative for cough and shortness of breath.   Cardiovascular:  Negative for chest pain.  Gastrointestinal:  Negative for abdominal pain, nausea and vomiting.  Musculoskeletal:  Negative for back pain.  Psychiatric/Behavioral:  Negative for  behavioral problems and confusion.    Vital Signs: BP (!) 150/65   Pulse 80   Temp 97.8 F (36.6 C) (Oral)   Resp (!) 23   Ht 5' 11"  (1.803 m)   Wt 182 lb 8.7 oz (82.8 kg)   SpO2 99%   BMI 25.46 kg/m   Physical Exam Vitals and nursing note reviewed.  Constitutional:      General: He is not in acute distress.    Appearance: Normal appearance. He is not ill-appearing.  HENT:     Mouth/Throat:     Mouth: Mucous membranes are moist.     Pharynx: Oropharynx is clear.  Cardiovascular:     Rate and Rhythm: Normal rate and regular rhythm.  Pulmonary:     Effort: Pulmonary effort is normal.     Breath sounds: Normal breath sounds.     Comments: Trach collar in place.  Abdominal:     General: Abdomen is flat.     Palpations: Abdomen is soft.     Comments: 12 Fr foley in place in gastrostomy tract  Skin:    General: Skin is warm and dry.  Neurological:     Mental Status: He is alert.     MD Evaluation Airway: Other (comments) (trach) Heart: WNL Abdomen: WNL Chest/ Lungs: WNL ASA  Classification: 3 Mallampati/Airway Score: Three (trach)   Imaging: CT ABDOMEN WO CONTRAST  Result Date: 10/25/2020 CLINICAL DATA:  Assess anatomy for G-tube placement EXAM: CT ABDOMEN WITHOUT CONTRAST TECHNIQUE: Multidetector CT imaging of the abdomen was performed following the standard protocol without IV contrast. COMPARISON:  09/10/2020 FINDINGS: Lower chest: Small bilateral  pleural effusions. Linear opacities at the lung bases likely due to atelectasis. There is airspace opacity in the right lower lobe suspicious for pneumonia. Hepatobiliary: Calcification located in the subcapsular region of the posterior right hepatic lobe may be due to prior granulomatous inflammation. The liver otherwise unremarkable. The gallbladder is absent. Pancreas: Unremarkable. No pancreatic ductal dilatation or surrounding inflammatory changes. Spleen: Normal in size without focal abnormality. Adrenals/Urinary Tract: Single punctate bilateral nonobstructing renal calculi. Kidneys and visualized segments of the ureters otherwise unremarkable. No significant abnormality of the adrenal glands. Stomach/Bowel: NG tube terminates in the distal gastric lumen. 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 No dilated bowel loops noted within the visualized abdomen. Vascular/Lymphatic: No enlarged abdominal lymph nodes. Atherosclerotic plaque seen scattered throughout the abdominal aorta. Other: Anterior abdominal wall surgical mesh material is noted. Areas of subcutaneous fat stranding in the anterior abdominal wall subcutaneous fat likely sites of medication administration. Musculoskeletal: Diffuse osteopenia extensive posterior fusion changes of the thoracolumbar spine. Fracture noted through the inferior endplate of E95 vertebral body. IMPRESSION: 1. Surgical mesh material interposed between the stomach and anterior abdominal wall. 2. Right lower lobe airspace opacities suspicious for pneumonia. Electronically Signed   By: Miachel Roux M.D.   On: 10/25/2020 08:28   DG Abd 1 View  Result Date: 11/20/2020 CLINICAL DATA:  Peg tube placement EXAM: ABDOMEN - 1 VIEW COMPARISON:  10/25/2020 FINDINGS: Catheter tubing overlies the mid abdomen. There is enteric contrast within the stomach and proximal duodenum. No extraluminal contrast collections. No gross free intraperitoneal air. Nonobstructive bowel gas pattern.  Extensive thoracolumbar fixation hardware with chronically fractured fixation rods. IMPRESSION: Enteric contrast within the stomach and proximal duodenum. No extraluminal contrast collections. Electronically Signed   By: Davina Poke D.O.   On: 11/20/2020 21:42   DG ABDOMEN PEG TUBE LOCATION  Result Date: 12/07/2020 CLINICAL DATA:  Foley  catheter placement via G-tube EXAM: ABDOMEN - 1 VIEW COMPARISON:  Radiograph 11/22/2020, CT 10/22/2020 FINDINGS: Partially opacified catheter tubing projects over the left upper quadrant compatible with a percutaneous gastrostomy placement. Contrast administered via this catheter tube partially outlines the gastric rugal folds and first portion of the duodenum compatible with intraluminal positioning. Prior abdominal hernia mesh repair. Additional surgical clips in the right upper quadrant compatible prior cholecystectomy. Atelectatic changes in the lung bases. Left basilar thickening/effusion. 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 Basilar atelectatic changes.  Left pleural effusion. Electronically Signed   By: Lovena Le M.D.   On: 11/18/2020 02:40   DG Chest Port 1 View  Result Date: 11/11/2020 CLINICAL DATA:  Respiratory failure EXAM: PORTABLE CHEST 1 VIEW COMPARISON:  November 08, 2020 FINDINGS: Tracheostomy catheter tip is 4.3 cm above the carina. No pneumothorax. Small left pleural effusion noted. No edema or airspace opacity. Heart is upper normal in size with pulmonary vascularity normal. No adenopathy. Postoperative changes in the thoracic and visualized upper lumbar regions noted. IMPRESSION: Tracheostomy as described without pneumothorax. Small left pleural effusion. No edema or airspace opacity. Heart upper normal in size. Electronically Signed   By: Lowella Grip III M.D.   On: 11/11/2020 07:59   DG CHEST PORT 1 VIEW  Result  Date: 11/08/2020 CLINICAL DATA:  History of lung infiltrate. EXAM: PORTABLE CHEST 1 VIEW COMPARISON:  11/06/2020. FINDINGS: Tracheostomy tube in stable position. Heart size normal. Persistent bibasilar atelectasis/infiltrates. Persistent small left pleural effusion. No definite right pleural effusion. No pneumothorax. Prior thoracolumbar spine fusion. Surgical clips noted over the upper chest and right upper quadrant. IMPRESSION: 1. Persistent bibasilar atelectasis/infiltrates. Small left pleural effusion. Exam unchanged from prior exam. 2.  Tracheostomy tube in stable position. Electronically Signed   By: [place]   On: 11/08/2020 05:47   DG CHEST PORT 1 VIEW  Result Date: 11/06/2020 CLINICAL DATA:  Cough.  Patient with a tracheostomy. EXAM: PORTABLE CHEST 1 VIEW COMPARISON:  11/01/2020 and older studies. FINDINGS: Hazy and linear opacities at both lung bases partly obscuring hemidiaphragms. This has increased compared to the prior exam. Findings consistent with a combination of small effusions with atelectasis or possibly infection, atelectasis favored. Mid to upper lungs are clear. No pneumothorax. Cardiac silhouette normal in size. No mediastinal or hilar masses. Stable tracheostomy. IMPRESSION: 1. Increase in lung base opacity since the prior exam consistent with a combination of pleural effusions and atelectasis, less likely infection. No evidence pulmonary edema. Electronically Signed   By: Lajean Manes M.D.   On: 11/06/2020 11:16   DG CHEST PORT 1 VIEW  Result Date: 11/01/2020 CLINICAL DATA:  Respiratory failure EXAM: PORTABLE CHEST 1 VIEW COMPARISON:  10/31/2020 FINDINGS: Tracheostomy, left internal jugular temporary hemodialysis catheter with its tip within the superior vena cava, nasoenteric feeding tube extending into the upper abdomen, are all unchanged. Pulmonary insufflation is stable. Mild right basilar atelectasis. Small laterally loculated left pleural effusion again noted. No  pneumothorax. Cardiac size within normal limits. Pulmonary vascularity is normal. No acute bone abnormality. Thoracolumbar fusion hardware again noted IMPRESSION: Stable support lines and tubes. Stable pulmonary insufflation with right basilar atelectasis. Stable small laterally loculated left pleural effusion Electronically Signed   By: Fidela Salisbury MD   On: 11/01/2020 04:12   DG Chest Port 1 View  Result Date: 10/31/2020 CLINICAL DATA:  Trach ostomy tube change, central line insertion EXAM: PORTABLE CHEST 1 VIEW  COMPARISON:  Portable exam 1525 hours compared to 10/22/2020 FINDINGS: New tracheostomy tube with tip projecting 13 mm above carina. LEFT jugular central venous catheter with tip projecting over SVC. Tip of feeding tube projects over distal gastric antrum. Normal heart size and mediastinal contours. Elevation of RIGHT diaphragm with bibasilar atelectasis and minimal LEFT pleural effusion. No definite pneumothorax. Bones demineralized with prior thoracolumbar spinal fixation. IMPRESSION: Line and tube positions as above. Bibasilar atelectasis and minimal LEFT pleural effusion. Electronically Signed   By: Lavonia Dana M.D.   On: 10/31/2020 15:43   DG Abd Portable 1V  Result Date: 11/22/2020 CLINICAL DATA:  Encounter for leaking PEG tube. EXAM: PORTABLE ABDOMEN - 1 VIEW COMPARISON:  11/20/2020 FINDINGS: Gastrostomy tubing is identified overlying the left lower quadrant of the abdomen. The tip of the gastrostomy tube is not confidently identified. Cannot confirm intraluminal placement based on current radiograph. No dilated bowel loops. Hernia mesh overlies the right lower quadrant of the abdomen. IMPRESSION: Gastrostomy tubing overlies the left lower quadrant of the abdomen. Cannot confirm intraluminal placement based on current radiograph. Injection water-soluble contrast into the gastrostomy tube is recommended if intraluminal placement confirmation is desired. Electronically Signed   By: Kerby Moors M.D.   On: 11/22/2020 18:35   DG Abd Portable 1V  Result Date: 10/25/2020 CLINICAL DATA:  Feeding tube placement EXAM: PORTABLE ABDOMEN - 1 VIEW COMPARISON:  10/15/2020 FINDINGS: Extensive spinal hardware redemonstrated. Esophageal tube tip overlies the gastroduodenal junction. Evidence of prior hernia repair. Visible gas pattern is unobstructed. IMPRESSION: Feeding tube tip overlies the gastroduodenal region. Electronically Signed   By: Donavan Foil M.D.   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  Ankylosing spondylitis ([place])   Diagnosed during lumbar fracture summer of 2016    Crohn's disease ([place])   Gout   HIstory of basal cell cancer of face   THYROID CA HX  History of kidney stones   Hypertension   Hypothyroidism   Impotence   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   Obesity (BMI 30-39.9)   Osteoporosis   Pt completed 5 years of fosamax in 2013     Prostate cancer with recurrence   Treated with prostatectomy with recurrence 2012 with Lupron treatment now him   Psoriasis   Thyroid cancer ([place]) 1999  Treated with RAI and total thyroidectomy   Type II diabetes mellitus ([place])  Past Surgical History: Past Surgical History: Procedure Laterality Date  ABDOMINAL EXPLORATION SURGERY    for small bowel obstruction  APPENDECTOMY  03/2007  BACK SURGERY    BASAL CELL CARCINOMA EXCISION  "several"  "head"  CARDIAC CATHETERIZATION  03/17/2003  CHOLECYSTECTOMY N/A 05/07/2016  Procedure: LAPAROSCOPIC CHOLECYSTECTOMY;   Surgeon: Fanny Skates, MD;  Location: [place];  Service: General;  Laterality: N/A;  COLON SURGERY  03/2007  Resection of cecum, appendix, terminal ileum (approximately/notes 10/10/2010  CYSTOSCOPY/URETEROSCOPY/HOLMIUM LASER/STENT PLACEMENT Right 07/03/2020  Procedure: CYSTOSCOPY/RETROGRADE/URETEROSCOPY REMOVAL OF BLADDER STONE;  Surgeon: Raynelle Bring, MD;  [facility];  Service: Urology;  Laterality: Right;  DIRECT LARYNGOSCOPY N/A 10/04/2020  Procedure: DIRECT LARYNGOSCOPY, PHARYNGOSCOPY WITH BIOPSY;  Surgeon: [name], DO;  Location: [place] [facility];  Service: ENT;  Laterality: N/A;  ESOPHAGOSCOPY N/A 10/04/2020  Procedure: ESOPHAGOSCOPY WITH BIOPSY;  Surgeon: [name], DO;  Location: [place];  Service: ENT;  Laterality: N/A;  GASTROSTOMY N/A 10/31/2020  Procedure: INSERTION OF GASTROSTOMY TUBE;  Surgeon: Rolm Bookbinder, MD;  Location: [place];  Service: General;  Laterality: N/A;  HERNIA REPAIR    LAMINECTOMY WITH POSTERIOR LATERAL ARTHRODESIS LEVEL 1 N/A 09/15/2020  Procedure: REVISION OF THORACOLUMBAR FUSION, ADDITION OF CROSS-CONNECTORS;  Surgeon: Eustace Moore, MD;  Location: [place];  Service: Neurosurgery;  Laterality: N/A;  LAPAROSCOPIC CHOLECYSTECTOMY  05/07/2016  LAPAROSCOPIC LYSIS OF ADHESIONS  05/07/2016  LAPAROSCOPIC LYSIS OF ADHESIONS N/A 05/07/2016  Procedure: LAPAROSCOPIC LYSIS OF ADHESIONS TIMES ONE HOUR;  Surgeon: Fanny Skates, MD;  Location: [place];  Service: General;  Laterality: N/A;  LAPAROTOMY N/A 10/31/2020  Procedure: EXPLORATORY LAPAROTOMY WITH [place];  Surgeon: Rolm Bookbinder, MD;  Location: [place];  Service: General;  Laterality: N/A;  POSTERIOR FUSION THORACIC SPINE  02/08/2016  1. Posterior thoracic arthrodesis T7-T11 utilizing morcellized allograft, 2. Posterior thoracic segmental fixation T7-T11 utilizing nuvasive pedicle screws  PROSTATECTOMY  06/2001  w/bilateral pelvic lymph nose dissection/notes 10/24/2010  SPINAL FUSION  12/2014  Open reduction internal fixation  of L1 Chance fracture with posterior fusion T10-L4 utilizing morcellized allograft and some local autograft, segmental instrumentation T10-L4 inclusive utilizing nuvasive pedicle screws/notes 12/16/2014  Stress Cardiolite  02/17/2003  THOROCOTOMY WITH LOBECTOMY  03/16/2015  Procedure: THOROCOTOMY WITH LOBECTOMY;  Surgeon: Ivin Poot, MD;  Location: [place];  Service: Thoracic;;  TONSILLECTOMY    TOTAL THYROIDECTOMY  1997  Venous Doppler  05/30/2004  VENTRAL HERNIA REPAIR  04/14/2008  VIDEO ASSISTED THORACOSCOPY Left 03/16/2015  Procedure: VIDEO ASSISTED THORACOSCOPY;  Surgeon: Ivin Poot, MD;  Location: [facility];  Service: Thoracic;  Laterality: Left; HPI: Pt is a 80 yo male admitted from [place] 5/24 to ICU after PEG placement and hypotension with intraabdominal bleed. Pt remained on vent post op. Pt with complex PMHx- admitted 09/10/20-10/06/20 after fall with T11 fx and L1 compression fx s/p PLIF T11-12 on 4/8. Pt with complex respiratory failure requiring trach 4/12.  Initial MBS 4/22 with severe pharyngeal dysphagia that related primarily to thickening of the prevertebral tissue at the level of C4-5. ENT consulted; supraglottic mass biopsied, negative for malignancy. 5/3 PCCM emergently consulted for inability to suction due to upper airway obstruction. Had significant increase of inflammatory tissue on posterior wall of trachea. Was able to bypass with distal XLT. 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  5/12 repeat MBS on CIR - persisting dysphagia with no functional improvement. 5/92 PEG complicated by intra-abdominal bleeding requiring emergent re-exploration, back on vent. 6/4 tolerating TC; 6/7 trach changed back to cuffless and vent removed from room. Resumed PMV use. Swallow consult 6/7.  Subjective: PMSV in place throughout the study Assessment / Plan / Recommendation [facility] IP CLINICAL IMPRESSIONS 11/15/2020 Clinical Impression Pt was seen in radiology suite for  modified barium swallow study. PMSV was in place throughout the study. Thickening of prevertebral tissue at the level of C4-5 continues to be noted and still impede complete epiglottic inversion, but this was slightly improved compared to the initial study on 09/29/20. Anterior laryngeal movement and pharyngeal constriction were reduced. Vallecular and posterior pharyngeal wall residue were noted. The prevertebral thickening facilitated penetration (PAS 3) of liquids secondary to  spillage of posterior pharyngeal wall residue over the arytenoids and into the larynx and to residue which accumulated on the laryngeal surface of the epiglottis during incomplete epiglottic inversion. A right head turn posture resulted in aspiration (PAS 7) of nectar thick liquids via tsp. A chin tuck posture with left head turn and effortful swallow improved epiglottic inversion and reduced frequency of penetration with honey and nectar thick liquids. No laryngeal invasion was noted with 1/2 tsp boluses of puree. Pt's independent use of an intermittent cough and secondary swallows was effective in clearing penetration (PAS 3), but coughing was ineffective for episodes of aspiration. Use of a combination of these multiple strategies shows therapeutic promise, but SLP anticipates that these will be laborious for the pt. Pt's NPO status will be continued with allowance of occasional ice chips following oral care. SLP will follow for continued treatment and is hopeful that p.o. intake (likely snacks of modified consistencies initially) can be initiated with training on compensatory strategies. SLP Visit Diagnosis Dysphagia, pharyngeal phase (R13.13) Attention and concentration deficit following -- Frontal lobe and executive function deficit following -- Impact on safety and function Moderate aspiration risk;Severe aspiration risk;Risk for inadequate nutrition/hydration   [facility] IP TREATMENT RECOMMENDATION 11/15/2020 Treatment Recommendations  Therapy as outlined in treatment plan below   Prognosis 11/15/2020 Prognosis for Safe Diet Advancement Fair Barriers to Reach Goals Severity of deficits;Time post onset Barriers/Prognosis Comment -- [facility] IP DIET RECOMMENDATION 11/15/2020 SLP Diet Recommendations NPO;Alternative means - long-term Liquid Administration via -- Medication Administration Via alternative means Compensations -- Postural Changes --   [facility] IP OTHER RECOMMENDATIONS 11/15/2020 Recommended Consults -- Oral Care Recommendations Oral care QID Other Recommendations --   [facility] IP FOLLOW UP RECOMMENDATIONS 11/15/2020 Follow up Recommendations Inpatient Rehab   [facility] IP FREQUENCY AND DURATION 11/15/2020 Speech Therapy Frequency (ACUTE ONLY) min 2x/week Treatment Duration 2 weeks      [facility] IP ORAL PHASE 11/15/2020 Oral Phase WFL Oral - Pudding Teaspoon -- Oral - Pudding Cup -- Oral - Honey Teaspoon -- Oral - Honey Cup -- Oral - Nectar Teaspoon -- Oral - Nectar Cup -- Oral - Nectar Straw -- Oral - Thin Teaspoon -- Oral - Thin Cup -- Oral - Thin Straw -- Oral - Puree -- Oral - Mech Soft -- Oral - Regular -- Oral - Multi-Consistency -- Oral - Pill -- Oral Phase - Comment --  [facility] IP PHARYNGEAL PHASE 11/15/2020 Pharyngeal Phase -- Pharyngeal- Pudding Teaspoon -- Pharyngeal -- Pharyngeal- Pudding Cup -- Pharyngeal -- Pharyngeal- Honey Teaspoon Reduced anterior laryngeal mobility;Reduced epiglottic inversion;Reduced airway/laryngeal closure;Pharyngeal residue - valleculae;Pharyngeal residue - posterior pharnyx;Reduced pharyngeal peristalsis Pharyngeal Material enters airway, remains ABOVE vocal cords and not ejected out Pharyngeal- Honey Cup Reduced anterior laryngeal mobility;Reduced epiglottic inversion;Reduced airway/laryngeal closure;Pharyngeal residue - valleculae;Pharyngeal residue - posterior pharnyx;Reduced pharyngeal peristalsis Pharyngeal Material enters airway, remains ABOVE vocal cords and not ejected out Pharyngeal- Nectar Teaspoon Reduced anterior laryngeal  mobility;Reduced epiglottic inversion;Reduced airway/laryngeal closure;Pharyngeal residue - valleculae;Pharyngeal residue - posterior pharnyx;Reduced pharyngeal peristalsis Pharyngeal -- Pharyngeal- Nectar Cup Reduced anterior laryngeal mobility;Reduced epiglottic inversion;Reduced airway/laryngeal closure;Pharyngeal residue - valleculae;Pharyngeal residue - posterior pharnyx;Reduced pharyngeal peristalsis Pharyngeal -- Pharyngeal- Nectar Straw -- Pharyngeal -- Pharyngeal- Thin Teaspoon -- Pharyngeal -- Pharyngeal- Thin Cup -- Pharyngeal -- Pharyngeal- Thin Straw -- Pharyngeal -- Pharyngeal- Puree Reduced anterior laryngeal mobility;Reduced epiglottic inversion;Reduced airway/laryngeal closure;Pharyngeal residue - valleculae;Pharyngeal residue - posterior pharnyx;Reduced pharyngeal peristalsis Pharyngeal -- Pharyngeal- Mechanical Soft -- Pharyngeal -- Pharyngeal- Regular -- Pharyngeal -- Pharyngeal- Multi-consistency -- Pharyngeal -- Pharyngeal-  Pill -- Pharyngeal -- Pharyngeal Comment --  [facility] IP CERVICAL ESOPHAGEAL PHASE 11/15/2020 Cervical Esophageal Phase WFL Pudding Teaspoon -- Pudding Cup -- Honey Teaspoon -- Honey Cup -- Nectar Teaspoon -- Nectar Cup -- Nectar Straw -- Thin Teaspoon -- Thin Cup -- Thin Straw -- Puree -- Mechanical Soft -- Regular -- Multi-consistency -- Pill -- Cervical Esophageal Comment -- Shanika I. Hardin Negus, [place], [place] Office number [phone] Pager [phone] Horton Marshall 11/15/2020, 3:53 PM               Labs:  CBC: Recent Labs    11/15/20 0207 11/16/20 0052 11/19/20 0312 11/21/20 0511  WBC 13.0* 11.4* 8.8 10.5  HGB 8.5* 8.8* 8.9* 9.8*  HCT 26.3* 26.5* 28.2* 31.8*  PLT 362 308 310 306    COAGS: Recent Labs    09/15/20 0059 10/26/20 1407 10/28/20 0457 10/29/20 0632 10/29/20 1717 10/30/20 0617 10/31/20 1835  INR 1.1  --   --   --   --   --  1.3*  APTT  --    < > 104* 125* 64* 88*  --    < > = values in this interval not  displayed.    BMP: Recent Labs    02/07/20 1506 02/07/20 1710 02/08/20 0327 02/09/20 0355 03/26/20 0923 11/18/20 0958 11/19/20 0312 11/20/20 0405 11/21/20 0511  NA 144  --  140 140   < > 130* 131* 133* 133*  K 3.8  --  3.3* 3.1*   < > 4.2 3.9 3.7 4.2  CL 101  --  96* 93*   < > 94* 96* 98 96*  CO2 29  --  29 35*   < > 30 31 30  33*  GLUCOSE 214*  --  118* 81   < > 145* 147* 111* 130*  BUN 23  --  18 12   < > 23 21 21 23   CALCIUM 9.2  --  8.9 8.4*   < > 8.6* 8.5* 8.2* 9.0  CREATININE 1.45* 1.59* 1.21 1.21   < > 0.82 0.73 0.69 0.79  GFRNONAA 46* 41* 57* 57*   < > >60 >60 >60 >60  GFRAA 53* 48* >60 >60  --   --   --   --   --    < > = values in this interval not displayed.    LIVER FUNCTION TESTS: Recent Labs    10/12/20 0249 10/13/20 0514 11/08/20 0038 11/12/20 0136  BILITOT 0.4 0.1* 0.5 0.4  AST 16 14* 14* 12*  ALT 9 10 13 10   ALKPHOS 107 99 78 76  PROT 5.5* 5.3* 5.8* 5.7*  ALBUMIN 2.3* 2.2* 2.6* 2.4*    TUMOR MARKERS: No results for input(s): AFPTM, CEA, CA199, CHROMGRNA in the last 8760 hours.  Assessment and Plan: Dysphagia, long-term care needs related to trach placement Patient with prior gastrostomy tube placed in OR 10/31/20 by Dr. Donne Hazel.   Tube dislodged in rehab facility, transferred to ED for replacement.  Surgery attempted replacement, however unsuccessful.  There is a 12 Fr foley in the trach for preservation.  IR consulted for fluoro-guided replacement of gastrostomy tube.   Case reviewed by Dr. Dwaine Gale who notes the possibility of an immature tract due to placement <6 weeks ago.  A foley is currently in place, however patient with difficult approach through surgical mesh.   Discussed the procedure with the daughter at bedside.  A percutaneous attempt can be made in hopes of being able to  replace a 16Fr gastrostomy tube.  Discussed risks.  She would like to avoid surgical replacement if possible given his extensive surgical history and prolonged  hospitalization in April of this year.  She requests we proceed in IR.   Risks and benefits image guided gastrostomy tube placement was discussed with the patient including, bleeding, infection, peritonitis and/or damage to adjacent structures.  All of the patient's questions were answered, patient is agreeable to proceed.  Consent signed and in chart.  Thank you for this interesting consult.  I greatly enjoyed meeting Devin [name] and look forward to participating in their care.  A copy of this report was sent to the requesting provider on this date.  Electronically Signed: Docia Barrier, PA 11/15/2020, 9:39 AM   I spent a total of  30 Minutes   in face to face in clinical consultation, greater than 50% of which was counseling/coordinating care for dysphagia, gastrostomy tube dislodged.

## 2020-11-23 NOTE — ED Provider Notes (Signed)
[facility] EMERGENCY DEPARTMENT Provider Note   CSN: [id number] Arrival date & time: 11/22/20  2351     History Chief Complaint  Patient presents with   GI Problem    Devin [name] is a 80 y.o. male.  HPI     This is a 80 year old male with a history of A. fib, lung cancer, hypertension, diabetes, recent gastrostomy tube placement for dysphagia who presents with concerns that the G-tube came out.  [facility]  He had a prolonged hospital course after having a gastrostomy tube placed on 5/24.  He had some postoperative bleeding requiring an ex lap and washout.  [facility].  Patient reports over the last 1 to 2 days he has had reflux and leakage around the G-tube site.  At 5 PM yesterday evening, the G-tube came out fully.  There was some concern by the RN staff that they felt like "some of the G-tube was still in the belly because it was too short."  Patient is not having any significant abdominal pain.  Denies fevers.  Otherwise states he is at his baseline.  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    Obesity (BMI 30-39.9)    Osteoporosis    Pt completed 5 years of fosamax in 2013      Paroxysmal atrial fibrillation (HCC)    Prostate cancer with recurrence    Treated with prostatectomy with recurrence 2012 with Lupron treatment now him    Psoriasis    Supraglottic stenosis    Thyroid cancer ([place]) 1999   Treated with RAI and total thyroidectomy    Type II diabetes mellitus ([place])      Patient Active Problem List   Diagnosis Date Noted   Paroxysmal atrial fibrillation ([place]) 11/22/2020   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/22/2020   Acute on chronic respiratory failure with hypoxia ([place]) 11/22/2020   Primary orthostatic hypotension 11/20/2020   Insomnia 11/20/2020   Pressure injury of skin 11/16/2020   Postoperative hypovolemic shock 11/15/2020   Shock ([place]) 10/31/2020   Sleep disturbance    Acute blood loss anemia    Controlled type 2 diabetes mellitus with hyperglycemia, with long-term current use of insulin ([place])    Dysphagia    Hyponatremia    Nasogastric tube present    Status post tracheostomy ([place])    Urinary retention    Debility 10/12/2020   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[facility])    Obstruction of upper airway due to foreign body    Hypoglycemia 10/07/2020   Thoracic vertebral fracture ([place]) 10/06/2020   Tracheostomy dependence (HCC)    Atrial fibrillation ([place])    History of cancer    Diabetes mellitus type 2 in nonobese [facility])    Chronic obstructive pulmonary disease (HCC)    Ineffective airway clearance    Acute encephalopathy    Status post thoracic spinal fusion 09/15/2020   Left rib fracture 09/10/2020  Hemothorax on left 09/10/2020   Acute on chronic respiratory failure with hypoxia and hypercapnia ([place]) 09/10/2020   S/P cystoscopy 07/03/2020   Respiratory failure ([place]) 07/03/2020   Acute on chronic respiratory failure with hypoxia ([place]) 07/03/2020   Paroxysmal atrial fibrillation ([place]) 03/30/2020   Secondary hypercoagulable state ([place]) 03/30/2020   Orthostatic hypotension 02/07/2020   Dyspnea 01/17/2020   NSTEMI (non-ST elevated myocardial infarction) ([place]) 01/04/2020   Crohn's ileocolitis ([place]) 02/21/2017   Hyperlipidemia 02/21/2017   Hyperuricemia 02/21/2017   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   Thyroid cancer ([place]) 02/21/2017   Cholecystitis with cholelithiasis 05/07/2016   Cholecystitis 03/14/2016   Abdominal pain 03/05/2016    Malnutrition of moderate degree 02/10/2016   Thoracic compression fracture ([place]) 02/08/2016   Back pain 01/26/2016   Hip pain 01/26/2016   S/P lobectomy of lung 03/16/2015   Adenocarcinoma of left lung, 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   Diabetic neuropathy (HCC)    Ankylosing spondylitis (HCC)    Obesity (BMI 30-39.9)    Crohn's disease ([place])    Lung nodule 02/23/2015   HIstory of basal cell cancer of face    History of thyroid cancer    Postsurgical hypothyroidism    Gout 12/29/2006   Essential hypertension 12/29/2006   Osteoporosis 12/29/2006   DM (diabetes mellitus), type 2 with renal complications ([place])    Prostate cancer with recurrence     Past Surgical History:  Procedure Laterality Date   ABDOMINAL EXPLORATION SURGERY     for small bowel obstruction   APPENDECTOMY  03/2007   BACK SURGERY     BASAL CELL CARCINOMA EXCISION  "several"   "head"   CARDIAC CATHETERIZATION  03/17/2003   CHOLECYSTECTOMY N/A 05/07/2016   Procedure: LAPAROSCOPIC CHOLECYSTECTOMY;  Surgeon: Fanny Skates, MD;  Location: [place];  Service: General;  Laterality: N/A;   COLON SURGERY  03/2007   Resection of cecum, appendix, terminal ileum (approximately/notes 10/10/2010   CYSTOSCOPY/URETEROSCOPY/HOLMIUM LASER/STENT PLACEMENT Right 07/03/2020   Procedure: CYSTOSCOPY/RETROGRADE/URETEROSCOPY REMOVAL OF BLADDER STONE;  Surgeon: Raynelle Bring, MD;  Location: [facility];  Service: Urology;  Laterality: Right;   DIRECT LARYNGOSCOPY N/A 10/04/2020   Procedure: DIRECT LARYNGOSCOPY, PHARYNGOSCOPY WITH BIOPSY;  Surgeon: [name], DO;  Location: [place] [facility];  Service: ENT;  Laterality: N/A;   ESOPHAGOSCOPY N/A 10/04/2020   Procedure: ESOPHAGOSCOPY WITH BIOPSY;  Surgeon: [name], DO;  Location: [place];  Service: ENT;  Laterality: N/A;   GASTROSTOMY N/A 10/31/2020   Procedure: INSERTION OF GASTROSTOMY TUBE;  Surgeon: Rolm Bookbinder, MD;  Location: [place];  Service: General;  Laterality: N/A;   HERNIA REPAIR      LAMINECTOMY WITH POSTERIOR LATERAL ARTHRODESIS LEVEL 1 N/A 09/15/2020   Procedure: REVISION OF THORACOLUMBAR FUSION, ADDITION OF CROSS-CONNECTORS;  Surgeon: Eustace Moore, MD;  Location: [place];  Service: Neurosurgery;  Laterality: N/A;   LAPAROSCOPIC CHOLECYSTECTOMY  05/07/2016   LAPAROSCOPIC LYSIS OF ADHESIONS  05/07/2016   LAPAROSCOPIC LYSIS OF ADHESIONS N/A 05/07/2016   Procedure: LAPAROSCOPIC LYSIS OF ADHESIONS TIMES ONE HOUR;  Surgeon: Fanny Skates, MD;  Location: [place];  Service: General;  Laterality: N/A;   LAPAROTOMY N/A 10/31/2020   Procedure: EXPLORATORY LAPAROTOMY WITH [place];  Surgeon: Rolm Bookbinder, MD;  Location: [place];  Service: General;  Laterality: N/A;   POSTERIOR FUSION THORACIC SPINE  02/08/2016   1. Posterior thoracic arthrodesis T7-T11 utilizing morcellized allograft, 2. Posterior thoracic segmental fixation T7-T11 utilizing nuvasive pedicle screws   PROSTATECTOMY  06/2001   w/bilateral pelvic lymph nose  dissection/notes 10/24/2010   SPINAL FUSION  12/2014   Open reduction internal fixation of L1 [place] fracture with posterior fusion T10-L4 utilizing morcellized allograft and some local autograft, segmental instrumentation T10-L4 inclusive utilizing nuvasive pedicle screws/notes 12/16/2014   Stress Cardiolite  02/17/2003   THOROCOTOMY WITH LOBECTOMY  03/16/2015   Procedure: THOROCOTOMY WITH LOBECTOMY;  Surgeon: Ivin Poot, MD;  Location: [facility];  Service: Thoracic;;   TONSILLECTOMY     TOTAL THYROIDECTOMY  1997   Venous Doppler  05/30/2004   VENTRAL HERNIA REPAIR  04/14/2008   VIDEO ASSISTED THORACOSCOPY Left 03/16/2015   Procedure: VIDEO ASSISTED THORACOSCOPY;  Surgeon: Ivin Poot, MD;  Location: [facility];  Service: Thoracic;  Laterality: Left;       Family History  Problem Relation Age of Onset   CAD Mother    Cancer Neg Hx        No cancer in the patient's immediate family, except of course for the patient himself, as noted    Social History    Tobacco Use   Smoking status: Former    Packs/day: 2.00    Years: 30.00    Pack years: 60.00    Types: Cigarettes    Quit date: 12/26/1989    Years since quitting: 30.9   Smokeless tobacco: Never  Vaping Use   Vaping Use: Never used  Substance Use Topics   Alcohol use: Not Currently    Alcohol/week: 0.0 standard drinks   Drug use: No    Home Medications Prior to Admission medications   Medication Sig Start Date End Date Taking? Authorizing Provider  acetaminophen (TYLENOL) 325 MG tablet Place 2 tablets (650 mg total) into feeding tube every 6 (six) hours as needed for mild pain. 11/20/20   Debbe Odea, MD  ALPRAZolam Duanne Moron) 0.25 MG tablet Take 0.5 tablets (0.125 mg total) by mouth 2 (two) times daily as needed for anxiety (use only for severe anxiety please). 11/20/20   Debbe Odea, MD  arformoterol (BROVANA) 15 MCG/2ML NEBU Take 2 mLs (15 mcg total) by nebulization 2 (two) times daily. 11/20/20   Debbe Odea, MD  bethanechol (URECHOLINE) 10 MG tablet Place 1 tablet (10 mg total) into feeding tube 4 (four) times daily. 11/20/20   Debbe Odea, MD  budesonide (PULMICORT) 0.5 MG/2ML nebulizer solution Take 2 mLs (0.5 mg total) by nebulization 2 (two) times daily. 10/06/20   Terrilee Croak, MD  cholestyramine (QUESTRAN) 4 g packet Place 1 packet (4 g total) into feeding tube 2 (two) times daily before lunch and supper. 10/06/20   Terrilee Croak, MD  diltiazem (CARDIZEM) 10 mg/ml oral suspension Place 3 mLs (30 mg total) into feeding tube every 6 (six) hours. 10/06/20   Terrilee Croak, MD  diphenoxylate-atropine (LOMOTIL) 2.5-0.025 MG tablet Take 1 tablet by mouth daily as needed for diarrhea or loose stools (after a watery BM). 11/20/20   Debbe Odea, MD  insulin aspart (NOVOLOG) 100 UNIT/ML injection Inject 0-9 Units into the skin every 4 (four) hours. 11/20/20   Debbe Odea, MD  insulin glargine (LANTUS) 100 UNIT/ML injection Inject 0.12 mLs (12 Units total) into the skin at bedtime.  11/20/20   Debbe Odea, MD  ipratropium-albuterol (DUONEB) 0.5-2.5 (3) MG/3ML SOLN Take 3 mLs by nebulization every 4 (four) hours as needed. 11/20/20   Debbe Odea, MD  levothyroxine (SYNTHROID) 150 MCG tablet Place 1 tablet (150 mcg total) into feeding tube daily at 6 (six) AM. 11/21/20   Debbe Odea, MD  Multiple Vitamin (MULTIVITAMIN WITH  MINERALS) TABS tablet Place 1 tablet into feeding tube daily. 11/21/20   Debbe Odea, MD  Nutritional Supplements (FEEDING SUPPLEMENT, PROSOURCE TF,) liquid Place 45 mLs into feeding tube 3 (three) times daily. 11/20/20   Debbe Odea, MD  Nutritional Supplements (FEEDING SUPPLEMENT, VITAL AF 1.2 CAL,) LIQD Place 1,000 mLs into feeding tube continuous. 11/20/20   Debbe Odea, MD  Nystatin (GERHARDT'S BUTT CREAM) CREA Apply 1 application topically 2 (two) times daily. 11/20/20   Debbe Odea, MD  oxyCODONE (OXY IR/ROXICODONE) 5 MG immediate release tablet Place 1 tablet (5 mg total) into feeding tube every 4 (four) hours as needed for moderate pain. 11/20/20   Debbe Odea, MD  PARoxetine (PAXIL) 10 MG tablet Place 1 tablet (10 mg total) into feeding tube daily. 11/21/20   Debbe Odea, MD  sodium chloride 0.9 % infusion Inject 75 mLs into the vein continuous. 11/20/20   Debbe Odea, MD  tamsulosin (FLOMAX) 0.4 MG CAPS capsule Take 1 capsule (0.4 mg total) by mouth daily. 10/12/20   Donne Hazel, MD  traZODone (DESYREL) 100 MG tablet Place 1 tablet (100 mg total) into feeding tube at bedtime. 11/20/20   Debbe Odea, MD    Allergies    Patient has no known allergies.  Review of Systems   Review of Systems  Constitutional:  Negative for fever.  Respiratory:  Negative for shortness of breath.   Cardiovascular:  Negative for chest pain.  Gastrointestinal:  Negative for abdominal pain, nausea and vomiting.       G-tube coming out  Genitourinary:  Negative for dysuria.  All other systems reviewed and are negative.  Physical Exam Updated Vital  Signs BP (!) 130/56   Pulse 69   Temp 98.1 F (36.7 C) (Oral)   Resp 18   Ht 1.803 m (5' 11" )   Wt 82.8 kg   SpO2 99%   BMI 25.46 kg/m   Physical Exam Vitals and nursing note reviewed.  Constitutional:      Appearance: He is well-developed.     Comments: Chronically ill-appearing, trach dependent  HENT:     Head: Normocephalic and atraumatic.     Nose: Nose normal.     Mouth/Throat:     Mouth: Mucous membranes are moist.  Eyes:     Pupils: Pupils are equal, round, and reactive to light.  Cardiovascular:     Rate and Rhythm: Normal rate and regular rhythm.     Heart sounds: Normal heart sounds. No murmur heard. Pulmonary:     Effort: Pulmonary effort is normal. No respiratory distress.     Breath sounds: Normal breath sounds. No wheezing.     Comments: Tracheostomy in place, coarse breath sounds bilaterally, patient on trach collar Abdominal:     General: Bowel sounds are normal.     Palpations: Abdomen is soft.     Tenderness: There is no abdominal tenderness. There is no rebound.     Comments: Ex lap incision clean dry and intact, no adjacent erythema, no significant tenderness palpation, G-tube site left upper abdomen with some slight purulence, no significant erythema  Musculoskeletal:     Cervical back: Neck supple.     Right lower leg: No edema.     Left lower leg: No edema.  Lymphadenopathy:     Cervical: No cervical adenopathy.  Skin:    General: Skin is warm and dry.  Neurological:     Mental Status: He is alert and oriented to person, place, and time.  Psychiatric:  Mood and Affect: Mood normal.    ED Results / Procedures / Treatments   Labs (all labs ordered are listed, but only abnormal results are displayed) Labs Reviewed - No data to display  EKG None  Radiology DG ABDOMEN PEG TUBE LOCATION  Result Date: 11/25/2020 CLINICAL DATA:  Foley catheter placement via G-tube EXAM: ABDOMEN - 1 VIEW COMPARISON:  Radiograph 11/22/2020, CT 10/22/2020  FINDINGS: Partially opacified catheter tubing projects over the left upper quadrant compatible with a percutaneous gastrostomy placement. Contrast administered via this catheter tube partially outlines the gastric rugal folds and first portion of the duodenum compatible with intraluminal positioning. Prior abdominal hernia mesh repair. Additional surgical clips in the right upper quadrant compatible prior cholecystectomy. Atelectatic changes in the lung bases. Left basilar thickening/effusion. 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 Basilar atelectatic changes.  Left pleural effusion. Electronically Signed   By: Lovena Le M.D.   On: 12/02/2020 02:40   DG Abd Portable 1V  Result Date: 11/22/2020 CLINICAL DATA:  Encounter for leaking PEG tube. EXAM: PORTABLE ABDOMEN - 1 VIEW COMPARISON:  11/20/2020 FINDINGS: Gastrostomy tubing is identified overlying the left lower quadrant of the abdomen. The tip of the gastrostomy tube is not confidently identified. Cannot confirm intraluminal placement based on current radiograph. No dilated bowel loops. Hernia mesh overlies the right lower quadrant of the abdomen. IMPRESSION: Gastrostomy tubing overlies the left lower quadrant of the abdomen. Cannot confirm intraluminal placement based on current radiograph. Injection water-soluble contrast into the gastrostomy tube is recommended if intraluminal placement confirmation is desired. Electronically Signed   By: Kerby Moors M.D.   On: 11/22/2020 18:35    Procedures Gastrostomy tube replacement  Date/Time: 11/26/2020 5:58 AM Performed by: Merryl Hacker, MD Authorized by: Merryl Hacker, MD  Consent: Verbal consent obtained. Consent given by: patient Local anesthesia used: no  Anesthesia: Local anesthesia used: no  Sedation: Patient sedated: no  Patient tolerance: patient  tolerated the procedure well with no immediate complications Comments: 1 French Foley catheter placed in progress gastrostomy site     Medications Ordered in ED Medications  lidocaine (XYLOCAINE) 2 % jelly 1 application (1 application Topical Given 11/12/2020 0109)  diatrizoate meglumine-sodium (GASTROGRAFIN) 66-10 % solution (30 mLs Gastric Tube Given 11/10/2020 0236)    ED Course  I have reviewed the triage vital signs and the nursing notes.  Pertinent labs & imaging results that were available during my care of the patient were reviewed by me and considered in my medical decision making (see chart for details).  Clinical Course as of 11/22/2020 0558  Thu Nov 23, 2020  0224 Successful placement of 12 French Foley.  Relatively easy insertion.  Dr. Dema Severin now at the bedside and attempting to place original G-tube.  This was unsuccessful.  Recommends IR change over. [CH]    Clinical Course User Index [CH] Keller Mikels, Barbette Hair, MD   MDM Rules/Calculators/A&P                          Patient presents with a dislodged gastrostomy tube.  Initial reports with concerns by [facility] staff at [facility] that the tube was too short and they were fearful for retained tube in the stomach.  I have taken a look at the removed gastrostomy tube at the bedside.  It appears completely intact.  Suspect that his G-tube has been out for some time as  it has been dysfunctional and leaking over the last 2 days.  Spoke with general surgery.  Given that he had an open gastrostomy, his stomach is tacked to the abdominal wall and there is much less concern for false track with replacement.  I successfully placed a 12 [place] Foley.  See clinical course above.  Unable to replace original gastrostomy tube.  He will need IR exchange.  This order has been placed.  Final Clinical Impression(s) / ED Diagnoses Final diagnoses:  Dislodged gastrostomy tube    Rx / DC Orders ED Discharge Orders     None        Merryl Hacker,  MD 11/08/2020 [phone]

## 2020-11-23 NOTE — ED Notes (Signed)
Pt went from IR straight back to [facility]

## 2020-11-24 DIAGNOSIS — I480 Paroxysmal atrial fibrillation: Secondary | ICD-10-CM | POA: Diagnosis not present

## 2020-11-24 DIAGNOSIS — J9621 Acute and chronic respiratory failure with hypoxia: Secondary | ICD-10-CM | POA: Diagnosis not present

## 2020-11-24 DIAGNOSIS — C3492 Malignant neoplasm of unspecified part of left bronchus or lung: Secondary | ICD-10-CM | POA: Diagnosis not present

## 2020-11-24 DIAGNOSIS — J386 Stenosis of larynx: Secondary | ICD-10-CM | POA: Diagnosis not present

## 2020-11-24 NOTE — Progress Notes (Signed)
Pulmonary [place]  PROGRESS NOTE     Devin [name]  BSW:[id number]  DOB: February 25, 1941   DOA: 11/11/2020  Referring Physician: Merton Border, MD  HPI: Devin [name] is a 80 y.o. male being followed for ventilator/airway/oxygen weaning Acute on Chronic Respiratory Failure.  Patient is on T collar using PMV right now is requiring 28% FiO2 with good saturations.  Medications: Reviewed on Rounds  Physical Exam:  Vitals: Temperature is 96.5 pulse 65 respiratory 17 blood pressure is 123/71 saturations 100%  Ventilator Settings off the ventilator on T collar  General: Comfortable at this time Neck: supple Cardiovascular: no malignant arrhythmias Respiratory: Scattered coarse breath sounds noted bilaterally Skin: no rash seen on limited exam Musculoskeletal: No gross abnormality Psychiatric:unable to assess Neurologic:no involuntary movements         Lab Data:   Basic Metabolic Panel: Recent Labs  Lab 11/18/20 0958 11/19/20 0312 11/20/20 0405 11/21/20 0511  NA 130* 131* 133* 133*  K 4.2 3.9 3.7 4.2  CL 94* 96* 98 96*  CO2 30 31 30  33*  GLUCOSE 145* 147* 111* 130*  BUN 23 21 21 23   CREATININE 0.82 0.73 0.69 0.79  CALCIUM 8.6* 8.5* 8.2* 9.0    ABG: No results for input(s): PHART, PCO2ART, PO2ART, HCO3, O2SAT in the last 168 hours.  Liver Function Tests: No results for input(s): AST, ALT, ALKPHOS, BILITOT, PROT, ALBUMIN in the last 168 hours. No results for input(s): LIPASE, AMYLASE in the last 168 hours. No results for input(s): AMMONIA in the last 168 hours.  CBC: Recent Labs  Lab 11/19/20 0312 11/21/20 0511  WBC 8.8 10.5  NEUTROABS  --  8.3*  HGB 8.9* 9.8*  HCT 28.2* 31.8*  MCV 96.2 97.8  PLT 310 306    Cardiac Enzymes: No results for input(s): CKTOTAL, CKMB, CKMBINDEX, TROPONINI in the last 168 hours.  BNP (last 3 results) Recent Labs    01/04/20 1411  07/03/20 1654 11/08/20 0026  BNP 125.3* 118.4* 269.8*    ProBNP (last 3 results) No results for input(s): PROBNP in the last 8760 hours.  Radiological Exams: IR Replc Gastro/Colonic Tube Percut W/Fluoro  Result Date: 11/09/2020 INDICATION: 80 year old gentleman with a dislodged gastrostomy tube presents to interventional Radiology for placement. EXAM: Fluoroscopy guided G-tube placement MEDICATIONS: None ANESTHESIA/SEDATION: Versed 1 mg IV; Fentanyl 50 mcg IV Moderate Sedation Time:  10 minutes The patient was continuously monitored during the procedure by the interventional radiology nurse under my direct supervision. CONTRAST:  10 mL of Omnipaque 300-administered into the gastric lumen. FLUOROSCOPY TIME:  Fluoroscopy Time: 0 minutes 12 seconds (4 mGy). COMPLICATIONS: None immediate. PROCEDURE: Informed written consent was obtained from the patient's daughter after a thorough discussion of the procedural risks, benefits and alternatives. All questions were addressed. Maximal Sterile Barrier Technique was utilized including caps, mask, sterile gowns, sterile gloves, sterile drape, hand hygiene and skin antiseptic. A timeout was performed prior to the initiation of the procedure. Existing Foley catheter placed in the gastrostomy opening did not have an end hole. It was removed. The tract was reaccess with Kumpe the catheter and mild loss guidewire. Contrast administered through the Kumpe the catheter under fluoroscopy confirmed appropriate access of the gastric lumen. Kumpe catheter was exchanged for 18 French balloon retention gastrostomy tube over 0.035 inch Amplatz guidewire. The balloon was inflated with 10 mL of normal saline. Contrast administered through the gastrostomy tube under fluoroscopy confirmed appropriate access. IMPRESSION:  Successful insertion of a 18 French balloon retention gastrostomy tube. Electronically Signed   By: Miachel Roux M.D.   On: 11/20/2020 14:26   DG ABDOMEN PEG TUBE  LOCATION  Result Date: 11/29/2020 CLINICAL DATA:  Foley catheter placement via G-tube EXAM: ABDOMEN - 1 VIEW COMPARISON:  Radiograph 11/22/2020, CT 10/22/2020 FINDINGS: Partially opacified catheter tubing projects over the left upper quadrant compatible with a percutaneous gastrostomy placement. Contrast administered via this catheter tube partially outlines the gastric rugal folds and first portion of the duodenum compatible with intraluminal positioning. Prior abdominal hernia mesh repair. Additional surgical clips in the right upper quadrant compatible prior cholecystectomy. Atelectatic changes in the lung bases. Left basilar thickening/effusion. 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 Basilar atelectatic changes.  Left pleural effusion. Electronically Signed   By: Lovena Le M.D.   On: 11/10/2020 02:40   DG Abd Portable 1V  Result Date: 11/22/2020 CLINICAL DATA:  Encounter for leaking PEG tube. EXAM: PORTABLE ABDOMEN - 1 VIEW COMPARISON:  11/20/2020 FINDINGS: Gastrostomy tubing is identified overlying the left lower quadrant of the abdomen. The tip of the gastrostomy tube is not confidently identified. Cannot confirm intraluminal placement based on current radiograph. No dilated bowel loops. Hernia mesh overlies the right lower quadrant of the abdomen. IMPRESSION: Gastrostomy tubing overlies the left lower quadrant of the abdomen. Cannot confirm intraluminal placement based on current radiograph. Injection water-soluble contrast into the gastrostomy tube is recommended if intraluminal placement confirmation is desired. Electronically Signed   By: Kerby Moors M.D.   On: 11/22/2020 18:35    Assessment/Plan Active Problems:   Adenocarcinoma of left lung, stage 1 (HCC)   Acute on chronic respiratory failure with hypoxia (HCC)   Atrial fibrillation (HCC)   Supraglottic  stenosis   Acute on chronic respiratory failure hypoxia plan is going to be to continue to wean on T collar using the PMV.  Patient seems to be tolerating it well.  Patient does have history of subglottic stenosis and will need to move slowly as far as being able to do capping trials. Atrial fibrillation chronic rate controlled we will continue to monitor closely. Adenocarcinoma status postresection supportive care follow-up after discharge Supraglottic stenosis this may be the limiting factor as far as being able to eventually And decannulate right now seems to be tolerating the PMV we will continue to make progress as tolerated   I have personally seen and evaluated the patient, evaluated laboratory and imaging results, formulated the assessment and plan and placed orders. The Patient requires high complexity decision making with multiple systems involvement.  Rounds were done with the Respiratory Therapy Director and Staff therapists and discussed with nursing staff also.  Allyne Gee, MD [facility] Pulmonary Critical Care Medicine Sleep Medicine

## 2020-11-25 DIAGNOSIS — C3492 Malignant neoplasm of unspecified part of left bronchus or lung: Secondary | ICD-10-CM | POA: Diagnosis not present

## 2020-11-25 DIAGNOSIS — I480 Paroxysmal atrial fibrillation: Secondary | ICD-10-CM | POA: Diagnosis not present

## 2020-11-25 DIAGNOSIS — J386 Stenosis of larynx: Secondary | ICD-10-CM | POA: Diagnosis not present

## 2020-11-25 DIAGNOSIS — J9621 Acute and chronic respiratory failure with hypoxia: Secondary | ICD-10-CM | POA: Diagnosis not present

## 2020-11-25 NOTE — Progress Notes (Signed)
Pulmonary [place]  PROGRESS NOTE     DIANA ARMIJO  PFX:[id number]  DOB: Mar 30, 1941   DOA: 12/07/2020  Referring Physician: Merton Border, MD  HPI: BLAIDEN WERTH is a 80 y.o. male being followed for ventilator/airway/oxygen weaning Acute on Chronic Respiratory Failure.  Patient is currently off the ventilator is tolerating the PMV very well has a good strong voice noted.  Medications: Reviewed on Rounds  Physical Exam:  Vitals: Temperature is 97.0 pulse 64 respiratory 17 blood pressure is 124/56 saturations 99%  Ventilator Settings on T collar FiO2 28%  General: Comfortable at this time Neck: supple Cardiovascular: no malignant arrhythmias Respiratory: No rhonchi very coarse breath sounds Skin: no rash seen on limited exam Musculoskeletal: No gross abnormality Psychiatric:unable to assess Neurologic:no involuntary movements         Lab Data:   Basic Metabolic Panel: Recent Labs  Lab 11/19/20 0312 11/20/20 0405 11/21/20 0511  NA 131* 133* 133*  K 3.9 3.7 4.2  CL 96* 98 96*  CO2 31 30 33*  GLUCOSE 147* 111* 130*  BUN 21 21 23   CREATININE 0.73 0.69 0.79  CALCIUM 8.5* 8.2* 9.0    ABG: No results for input(s): PHART, PCO2ART, PO2ART, HCO3, O2SAT in the last 168 hours.  Liver Function Tests: No results for input(s): AST, ALT, ALKPHOS, BILITOT, PROT, ALBUMIN in the last 168 hours. No results for input(s): LIPASE, AMYLASE in the last 168 hours. No results for input(s): AMMONIA in the last 168 hours.  CBC: Recent Labs  Lab 11/19/20 0312 11/21/20 0511  WBC 8.8 10.5  NEUTROABS  --  8.3*  HGB 8.9* 9.8*  HCT 28.2* 31.8*  MCV 96.2 97.8  PLT 310 306    Cardiac Enzymes: No results for input(s): CKTOTAL, CKMB, CKMBINDEX, TROPONINI in the last 168 hours.  BNP (last 3 results) Recent Labs    01/04/20 1411 07/03/20 1654 11/08/20 0026  BNP 125.3* 118.4* 269.8*     ProBNP (last 3 results) No results for input(s): PROBNP in the last 8760 hours.  Radiological Exams: IR Replc Gastro/Colonic Tube Percut W/Fluoro  Result Date: 11/18/2020 INDICATION: 80 year old gentleman with a dislodged gastrostomy tube presents to interventional Radiology for placement. EXAM: Fluoroscopy guided G-tube placement MEDICATIONS: None ANESTHESIA/SEDATION: Versed 1 mg IV; Fentanyl 50 mcg IV Moderate Sedation Time:  10 minutes The patient was continuously monitored during the procedure by the interventional radiology nurse under my direct supervision. CONTRAST:  10 mL of Omnipaque 300-administered into the gastric lumen. FLUOROSCOPY TIME:  Fluoroscopy Time: 0 minutes 12 seconds (4 mGy). COMPLICATIONS: None immediate. PROCEDURE: Informed written consent was obtained from the patient's daughter after a thorough discussion of the procedural risks, benefits and alternatives. All questions were addressed. Maximal Sterile Barrier Technique was utilized including caps, mask, sterile gowns, sterile gloves, sterile drape, hand hygiene and skin antiseptic. A timeout was performed prior to the initiation of the procedure. Existing Foley catheter placed in the gastrostomy opening did not have an end hole. It was removed. The tract was reaccess with Kumpe the catheter and mild loss guidewire. Contrast administered through the Kumpe the catheter under fluoroscopy confirmed appropriate access of the gastric lumen. Kumpe catheter was exchanged for 18 French balloon retention gastrostomy tube over 0.035 inch Amplatz guidewire. The balloon was inflated with 10 mL of normal saline. Contrast administered through the gastrostomy tube under fluoroscopy confirmed appropriate access. IMPRESSION: Successful insertion of a 72 French balloon retention gastrostomy  tube. Electronically Signed   By: Miachel Roux M.D.   On: 11/20/2020 14:26    Assessment/Plan Active Problems:   Adenocarcinoma of left lung, stage 1  (HCC)   Acute on chronic respiratory failure with hypoxia (HCC)   Atrial fibrillation (HCC)   Supraglottic stenosis   Acute on chronic respiratory failure hypoxia plan is going to be to try to do the capping since the patient has been tolerating PMV.  Respiratory therapy is aware of the patient's complicated upper airway with a history of supraglottic subglottic stenosis.  In addition I think that ENT evaluation should be recommended if the patient is not tolerating capping Adenocarcinoma of the lung status postresection Paroxysmal atrial fibrillation rate now rate is controlled we will continue to monitor closely. Supraglottic and subglottic stenosis patient has been requiring an XLT tracheostomy we will have respiratory assess the potential for capping patient's not able to tolerate and would consider ENT evaluation.   I have personally seen and evaluated the patient, evaluated laboratory and imaging results, formulated the assessment and plan and placed orders. The Patient requires high complexity decision making with multiple systems involvement.  Rounds were done with the Respiratory Therapy Director and Staff therapists and discussed with nursing staff also.  Allyne Gee, MD [facility] Pulmonary Critical Care Medicine Sleep Medicine

## 2020-11-26 ENCOUNTER — Other Ambulatory Visit (HOSPITAL_COMMUNITY): Payer: Self-pay

## 2020-11-26 DIAGNOSIS — C3492 Malignant neoplasm of unspecified part of left bronchus or lung: Secondary | ICD-10-CM | POA: Diagnosis not present

## 2020-11-26 DIAGNOSIS — J386 Stenosis of larynx: Secondary | ICD-10-CM | POA: Diagnosis not present

## 2020-11-26 DIAGNOSIS — J9621 Acute and chronic respiratory failure with hypoxia: Secondary | ICD-10-CM | POA: Diagnosis not present

## 2020-11-26 DIAGNOSIS — I480 Paroxysmal atrial fibrillation: Secondary | ICD-10-CM | POA: Diagnosis not present

## 2020-11-26 LAB — CBC
HCT: 36.2 % — ABNORMAL LOW (ref 39.0–52.0)
Hemoglobin: 10.8 g/dL — ABNORMAL LOW (ref 13.0–17.0)
MCH: 30.2 pg (ref 26.0–34.0)
MCHC: 29.8 g/dL — ABNORMAL LOW (ref 30.0–36.0)
MCV: 101.1 fL — ABNORMAL HIGH (ref 80.0–100.0)
Platelets: 235 10*3/uL (ref 150–400)
RBC: 3.58 MIL/uL — ABNORMAL LOW (ref 4.22–5.81)
RDW: 14.3 % (ref 11.5–15.5)
WBC: 15.7 10*3/uL — ABNORMAL HIGH (ref 4.0–10.5)
nRBC: 0 % (ref 0.0–0.2)

## 2020-11-26 LAB — BLOOD GAS, ARTERIAL
Acid-Base Excess: 5.4 mmol/L — ABNORMAL HIGH (ref 0.0–2.0)
Acid-Base Excess: 5.9 mmol/L — ABNORMAL HIGH (ref 0.0–2.0)
Acid-Base Excess: 5.5 mmol/L — ABNORMAL HIGH (ref 0.0–2.0)
Bicarbonate: 35.1 mmol/L — ABNORMAL HIGH (ref 20.0–28.0)
Bicarbonate: 33.7 mmol/L — ABNORMAL HIGH (ref 20.0–28.0)
Bicarbonate: 30.6 mmol/L — ABNORMAL HIGH (ref 20.0–28.0)
Drawn by: 164
FIO2: 40
FIO2: 60
FIO2: 60
O2 Saturation: 92.4 %
O2 Saturation: 29.7 %
O2 Saturation: 96.9 %
Patient temperature: 37
Patient temperature: 37
Patient temperature: 37
pCO2 arterial: >120 mmHg (ref 32.0–48.0)
pCO2 arterial: 91 mmHg (ref 32.0–48.0)
pCO2 arterial: 54.5 mmHg — ABNORMAL HIGH (ref 32.0–48.0)
pH, Arterial: 7.082 — CL (ref 7.350–7.450)
pH, Arterial: 7.194 — CL (ref 7.350–7.450)
pH, Arterial: 7.368 (ref 7.350–7.450)
pO2, Arterial: 70.4 mmHg — ABNORMAL LOW (ref 83.0–108.0)
pO2, Arterial: <32 mmHg — CL (ref 83.0–108.0)
pO2, Arterial: 73.9 mmHg — ABNORMAL LOW (ref 83.0–108.0)

## 2020-11-26 LAB — BASIC METABOLIC PANEL
Anion gap: 4 — ABNORMAL LOW (ref 5–15)
BUN: 26 mg/dL — ABNORMAL HIGH (ref 8–23)
CO2: 35 mmol/L — ABNORMAL HIGH (ref 22–32)
Calcium: 9.2 mg/dL (ref 8.9–10.3)
Chloride: 93 mmol/L — ABNORMAL LOW (ref 98–111)
Creatinine, Ser: 0.87 mg/dL (ref 0.61–1.24)
GFR, Estimated: >60 mL/min (ref >60)
Glucose, Bld: 195 mg/dL — ABNORMAL HIGH (ref 70–99)
Potassium: 5.1 mmol/L (ref 3.5–5.1)
Sodium: 132 mmol/L — ABNORMAL LOW (ref 135–145)

## 2020-11-26 LAB — PROCALCITONIN: Procalcitonin: 4.55 ng/mL

## 2020-11-26 LAB — LACTIC ACID, PLASMA: Lactic Acid, Venous: 1.1 mmol/L (ref 0.5–1.9)

## 2020-11-26 NOTE — Progress Notes (Signed)
Pulmonary [place] [name]  PROGRESS NOTE     Devin [name]  QBH:[id number]  DOB: [birth year]   DOA: 11/26/2020  Referring Physician: Merton Border, MD  HPI: Devin [name] is a 80 y.o. male being followed for ventilator/airway/oxygen weaning Acute on Chronic Respiratory Failure.  Patient at this time is on T collar has been on 40% FiO2 therapy evaluated to capping trials yesterday but had some episodes of vomiting so holding off on capping for right now  Medications: Reviewed on Rounds  Physical Exam:  Vitals: Temperature is 96.1 pulse 85 respiratory is 19 blood pressure is 127/52 saturations 97%  Ventilator Settings currently is on T collar has been on 40% FiO2  General: Comfortable at this time Neck: supple Cardiovascular: no malignant arrhythmias Respiratory: No rhonchi very coarse breath sounds Skin: no rash seen on limited exam Musculoskeletal: No gross abnormality Psychiatric:unable to assess Neurologic:no involuntary movements         Lab Data:   Basic Metabolic Panel: Recent Labs  Lab 11/20/20 0405 11/21/20 0511 11/26/20 0941  NA 133* 133* 132*  K 3.7 4.2 5.1  CL 98 96* 93*  CO2 30 33* 35*  GLUCOSE 111* 130* 195*  BUN 21 23 26*  CREATININE 0.69 0.79 0.87  CALCIUM 8.2* 9.0 9.2    ABG: No results for input(s): PHART, PCO2ART, PO2ART, HCO3, O2SAT in the last 168 hours.  Liver Function Tests: No results for input(s): AST, ALT, ALKPHOS, BILITOT, PROT, ALBUMIN in the last 168 hours. No results for input(s): LIPASE, AMYLASE in the last 168 hours. No results for input(s): AMMONIA in the last 168 hours.  CBC: Recent Labs  Lab 11/21/20 0511 11/26/20 0941  WBC 10.5 15.7*  NEUTROABS 8.3*  --   HGB 9.8* 10.8*  HCT 31.8* 36.2*  MCV 97.8 101.1*  PLT 306 235    Cardiac Enzymes: No results for input(s): CKTOTAL, CKMB, CKMBINDEX, TROPONINI in the last 168 hours.  BNP (last 3  results) Recent Labs    01/04/20 1411 07/03/20 1654 11/08/20 0026  BNP 125.3* 118.4* 269.8*    ProBNP (last 3 results) No results for input(s): PROBNP in the last 8760 hours.  Radiological Exams: DG Abd 1 View  Result Date: 11/26/2020 CLINICAL DATA:  Vomiting and shortness of breath. EXAM: ABDOMEN - 1 VIEW COMPARISON:  Abdominal radiograph November 23, 2020. FINDINGS: Layering bilateral pleural effusions with underlying pulmonary consolidation. Gastrostomy tube projects over the upper abdomen. Gas is demonstrated within nondilated loops of large and small bowel in a nonobstructed pattern. Postsurgical changes within the pelvis. Postsurgical changes involving the thoracic and lumbar spine. Prior abdominal hernia mesh repair. IMPRESSION: Nonobstructed bowel gas pattern. Electronically Signed   By: Lovey Newcomer M.D.   On: 11/26/2020 10:12   DG Chest Port 1 View  Result Date: 11/26/2020 CLINICAL DATA:  Vomiting and shortness of breath. EXAM: PORTABLE CHEST 1 VIEW COMPARISON:  Chest radiograph 11/11/2020. FINDINGS: Tracheostomy tube tip projects in the mid trachea. Monitoring leads overlie the patient. Stable cardiac and mediastinal contours. Elevation the right hemidiaphragm. Small bilateral pleural effusions with underlying opacities. No pneumothorax. IMPRESSION: Small bilateral pleural effusions with underlying opacities which may represent atelectasis or infection. Electronically Signed   By: Lovey Newcomer M.D.   On: 11/26/2020 10:14    Assessment/Plan Active Problems:   Adenocarcinoma of left lung, stage 1 (HCC)   Acute on chronic respiratory failure with hypoxia (HCC)   Atrial fibrillation ([place])  Supraglottic stenosis   Acute on chronic respiratory failure with hypoxia plan is going to be to continue with the attempts at weaning once the vomiting settles down.  Chest x-ray had shown small effusions really no new infiltrates noted. Adenocarcinoma status postresection Atrial fibrillation rate  is controlled at this time Supraglottic stenosis no change we will continue to follow along closely   I have personally seen and evaluated the patient, evaluated laboratory and imaging results, formulated the assessment and plan and placed orders. The Patient requires high complexity decision making with multiple systems involvement.  Rounds were done with the Respiratory Therapy Director and Staff therapists and discussed with nursing staff also.  Allyne Gee, MD [facility] Pulmonary Critical Care Medicine Sleep Medicine

## 2020-11-27 ENCOUNTER — Other Ambulatory Visit (HOSPITAL_COMMUNITY): Payer: Self-pay

## 2020-11-27 DIAGNOSIS — J9621 Acute and chronic respiratory failure with hypoxia: Secondary | ICD-10-CM | POA: Diagnosis not present

## 2020-11-27 DIAGNOSIS — I480 Paroxysmal atrial fibrillation: Secondary | ICD-10-CM | POA: Diagnosis not present

## 2020-11-27 DIAGNOSIS — C3492 Malignant neoplasm of unspecified part of left bronchus or lung: Secondary | ICD-10-CM | POA: Diagnosis not present

## 2020-11-27 DIAGNOSIS — J386 Stenosis of larynx: Secondary | ICD-10-CM | POA: Diagnosis not present

## 2020-11-27 LAB — CBC
HCT: 31.5 % — ABNORMAL LOW (ref 39.0–52.0)
Hemoglobin: 10 g/dL — ABNORMAL LOW (ref 13.0–17.0)
MCH: 31.1 pg (ref 26.0–34.0)
MCHC: 31.7 g/dL (ref 30.0–36.0)
MCV: 97.8 fL (ref 80.0–100.0)
Platelets: 229 10*3/uL (ref 150–400)
RBC: 3.22 MIL/uL — ABNORMAL LOW (ref 4.22–5.81)
RDW: 14.3 % (ref 11.5–15.5)
WBC: 29.2 10*3/uL — ABNORMAL HIGH (ref 4.0–10.5)
nRBC: 0 % (ref 0.0–0.2)

## 2020-11-27 LAB — BLOOD GAS, ARTERIAL
Acid-Base Excess: 4.1 mmol/L — ABNORMAL HIGH (ref 0.0–2.0)
Bicarbonate: 28.6 mmol/L — ABNORMAL HIGH (ref 20.0–28.0)
FIO2: 40
O2 Saturation: 97.3 %
Patient temperature: 37
pCO2 arterial: 47 mmHg (ref 32.0–48.0)
pH, Arterial: 7.402 (ref 7.350–7.450)
pO2, Arterial: 80.7 mmHg — ABNORMAL LOW (ref 83.0–108.0)

## 2020-11-27 LAB — BASIC METABOLIC PANEL
Anion gap: 11 (ref 5–15)
BUN: 33 mg/dL — ABNORMAL HIGH (ref 8–23)
CO2: 30 mmol/L (ref 22–32)
Calcium: 9.3 mg/dL (ref 8.9–10.3)
Chloride: 93 mmol/L — ABNORMAL LOW (ref 98–111)
Creatinine, Ser: 1.16 mg/dL (ref 0.61–1.24)
GFR, Estimated: >60 mL/min (ref >60)
Glucose, Bld: 118 mg/dL — ABNORMAL HIGH (ref 70–99)
Potassium: 5.4 mmol/L — ABNORMAL HIGH (ref 3.5–5.1)
Sodium: 134 mmol/L — ABNORMAL LOW (ref 135–145)

## 2020-11-27 LAB — URINALYSIS, MICROSCOPIC (REFLEX)
RBC / HPF: >50 RBC/hpf (ref 0–5)
Squamous Epithelial / HPF: NONE SEEN (ref 0–5)
WBC, UA: >50 WBC/hpf (ref 0–5)

## 2020-11-27 LAB — URINALYSIS, ROUTINE W REFLEX MICROSCOPIC
Bilirubin Urine: NEGATIVE
Glucose, UA: NEGATIVE mg/dL
Ketones, ur: NEGATIVE mg/dL
Nitrite: POSITIVE — AB
Protein, ur: 100 mg/dL — AB
Specific Gravity, Urine: 1.015 (ref 1.005–1.030)
pH: 8.5 — ABNORMAL HIGH (ref 5.0–8.0)

## 2020-11-27 NOTE — Progress Notes (Signed)
Pulmonary [place]  PROGRESS NOTE     Devin [name]  AOZ:[id number]  DOB: 11-20-1940   DOA: 11/27/2020  Referring Physician: Merton Border, MD  HPI: Devin [name] is a 80 y.o. male being followed for ventilator/airway/oxygen weaning Acute on Chronic Respiratory Failure.  Patient has had significant decline in status ended up back on the ventilator his imagings were reviewed.  Discussed the case during rounds with the primary care team.  Chest x-ray is also showing right sided infiltrate pneumonia.  Had some episodes of vomiting as already noted previously likely has aspirated.  Patient was started on vancomycin and cefepime by the primary care team  Medications: Reviewed on Rounds  Physical Exam:  Vitals: Temperature is 99.4 pulse 101 respiratory 29 blood pressure is 140/56 saturations 100%  Ventilator Settings on the ventilator right now assist control FiO2 of 50% tidal volume 530  General: Comfortable at this time Neck: supple Cardiovascular: no malignant arrhythmias Respiratory: Scattered rhonchi are noted bilaterally Skin: no rash seen on limited exam Musculoskeletal: No gross abnormality Psychiatric:unable to assess Neurologic:no involuntary movements         Lab Data:   Basic Metabolic Panel: Recent Labs  Lab 11/21/20 0511 11/26/20 0941 11/27/20 0746  NA 133* 132* 134*  K 4.2 5.1 5.4*  CL 96* 93* 93*  CO2 33* 35* 30  GLUCOSE 130* 195* 118*  BUN 23 26* 33*  CREATININE 0.79 0.87 1.16  CALCIUM 9.0 9.2 9.3    ABG: Recent Labs  Lab 11/26/20 1600 11/26/20 1934 11/26/20 2040  PHART 7.082* 7.194* 7.368  PCO2ART >120* 91.0* 54.5*  PO2ART 70.4* <32.00* 73.9*  HCO3 35.1* 33.7* 30.6*  O2SAT 92.4 29.7 96.9    Liver Function Tests: No results for input(s): AST, ALT, ALKPHOS, BILITOT, PROT, ALBUMIN in the last 168 hours. No results for input(s): LIPASE, AMYLASE in the last  168 hours. No results for input(s): AMMONIA in the last 168 hours.  CBC: Recent Labs  Lab 11/21/20 0511 11/26/20 0941 11/27/20 0746  WBC 10.5 15.7* 29.2*  NEUTROABS 8.3*  --   --   HGB 9.8* 10.8* 10.0*  HCT 31.8* 36.2* 31.5*  MCV 97.8 101.1* 97.8  PLT 306 235 229    Cardiac Enzymes: No results for input(s): CKTOTAL, CKMB, CKMBINDEX, TROPONINI in the last 168 hours.  BNP (last 3 results) Recent Labs    01/04/20 1411 07/03/20 1654 11/08/20 0026  BNP 125.3* 118.4* 269.8*    ProBNP (last 3 results) No results for input(s): PROBNP in the last 8760 hours.  Radiological Exams: DG Abd 1 View  Result Date: 11/26/2020 CLINICAL DATA:  Vomiting and shortness of breath. EXAM: ABDOMEN - 1 VIEW COMPARISON:  Abdominal radiograph November 23, 2020. FINDINGS: Layering bilateral pleural effusions with underlying pulmonary consolidation. Gastrostomy tube projects over the upper abdomen. Gas is demonstrated within nondilated loops of large and small bowel in a nonobstructed pattern. Postsurgical changes within the pelvis. Postsurgical changes involving the thoracic and lumbar spine. Prior abdominal hernia mesh repair. IMPRESSION: Nonobstructed bowel gas pattern. Electronically Signed   By: Lovey Newcomer M.D.   On: 11/26/2020 10:12   DG Chest Port 1 View  Result Date: 11/26/2020 CLINICAL DATA:  Vomiting and shortness of breath. EXAM: PORTABLE CHEST 1 VIEW COMPARISON:  Chest radiograph 11/11/2020. FINDINGS: Tracheostomy tube tip projects in the mid trachea. Monitoring leads overlie the patient. Stable cardiac and mediastinal contours. Elevation the right hemidiaphragm. Small bilateral  pleural effusions with underlying opacities. No pneumothorax. IMPRESSION: Small bilateral pleural effusions with underlying opacities which may represent atelectasis or infection. Electronically Signed   By: Lovey Newcomer M.D.   On: 11/26/2020 10:14    Assessment/Plan Active Problems:   Adenocarcinoma of left lung, stage 1  (HCC)   Acute on chronic respiratory failure with hypoxia (HCC)   Atrial fibrillation (HCC)   Supraglottic stenosis   Acute on chronic respiratory failure with hypoxia patient has apparently had significant decline as noted above we will go ahead and continue on full support no weaning right now.  I have ordered a follow-up chest x-ray also asked for a follow-up ABG to be to be done so that we can adjust the ventilator settings appropriately. Vomiting with probable aspiration pneumonia plan is going to be to continue with the selected antibiotics suggested perhaps anaerobic coverage to broaden the spectrum.  We could adjust this by either adding clindamycin or Flagyl we will leave up to primary care team. Atrial fibrillation right now rate is controlled we will continue to monitor closely Adenocarcinoma stage I status post resection supportive care we will follow-up after discharge has been stable Supraglottic stenosis patient after surprisingly had been tolerating the capping very well prior to his vomiting episodes and aspiration.   I have personally seen and evaluated the patient, evaluated laboratory and imaging results, formulated the assessment and plan and placed orders. The Patient requires high complexity decision making with multiple systems involvement.  Rounds were done with the Respiratory Therapy Director and Staff therapists and discussed with nursing staff also.  Allyne Gee, MD [facility] Pulmonary Critical Care Medicine Sleep Medicine

## 2020-11-28 DIAGNOSIS — C3492 Malignant neoplasm of unspecified part of left bronchus or lung: Secondary | ICD-10-CM | POA: Diagnosis not present

## 2020-11-28 DIAGNOSIS — I480 Paroxysmal atrial fibrillation: Secondary | ICD-10-CM | POA: Diagnosis not present

## 2020-11-28 DIAGNOSIS — J386 Stenosis of larynx: Secondary | ICD-10-CM | POA: Diagnosis not present

## 2020-11-28 DIAGNOSIS — J9621 Acute and chronic respiratory failure with hypoxia: Secondary | ICD-10-CM | POA: Diagnosis not present

## 2020-11-28 LAB — BASIC METABOLIC PANEL
Anion gap: 7 (ref 5–15)
BUN: 33 mg/dL — ABNORMAL HIGH (ref 8–23)
CO2: 33 mmol/L — ABNORMAL HIGH (ref 22–32)
Calcium: 8.9 mg/dL (ref 8.9–10.3)
Chloride: 92 mmol/L — ABNORMAL LOW (ref 98–111)
Creatinine, Ser: 1.01 mg/dL (ref 0.61–1.24)
GFR, Estimated: >60 mL/min (ref >60)
Glucose, Bld: 114 mg/dL — ABNORMAL HIGH (ref 70–99)
Potassium: 4.2 mmol/L (ref 3.5–5.1)
Sodium: 132 mmol/L — ABNORMAL LOW (ref 135–145)

## 2020-11-28 LAB — CBC
HCT: 26.2 % — ABNORMAL LOW (ref 39.0–52.0)
Hemoglobin: 8.4 g/dL — ABNORMAL LOW (ref 13.0–17.0)
MCH: 30.7 pg (ref 26.0–34.0)
MCHC: 32.1 g/dL (ref 30.0–36.0)
MCV: 95.6 fL (ref 80.0–100.0)
Platelets: 211 10*3/uL (ref 150–400)
RBC: 2.74 MIL/uL — ABNORMAL LOW (ref 4.22–5.81)
RDW: 14.7 % (ref 11.5–15.5)
WBC: 22.8 10*3/uL — ABNORMAL HIGH (ref 4.0–10.5)
nRBC: 0 % (ref 0.0–0.2)

## 2020-11-28 NOTE — Progress Notes (Signed)
Pulmonary [place]  PROGRESS NOTE     MAN EFFERTZ  [id number]:[id number]  DOB: March 22, 1941   DOA: 11/08/2020  Referring Physician: Merton Border, MD  HPI: Devin [name] is a 80 y.o. male being followed for ventilator/airway/oxygen weaning Acute on Chronic Respiratory Failure.  Patient is comfortable right now without distress has been on assist control on 28% FiO2 with a PEEP of 5  Medications: Reviewed on Rounds  Physical Exam:  Vitals: Temperature is 99 pulse 103 respiratory 27 blood pressure is 145/79 saturations 96%  Ventilator Settings on assist control FiO2 of 28% PEEP 5 tidal volumes 530  General: Comfortable at this time Neck: supple Cardiovascular: no malignant arrhythmias Respiratory: No rhonchi very coarse breath sounds Skin: no rash seen on limited exam Musculoskeletal: No gross abnormality Psychiatric:unable to assess Neurologic:no involuntary movements         Lab Data:   Basic Metabolic Panel: Recent Labs  Lab 11/26/20 0941 11/27/20 0746 11/28/20 0358  NA 132* 134* 132*  K 5.1 5.4* 4.2  CL 93* 93* 92*  CO2 35* 30 33*  GLUCOSE 195* 118* 114*  BUN 26* 33* 33*  CREATININE 0.87 1.16 1.01  CALCIUM 9.2 9.3 8.9    ABG: Recent Labs  Lab 11/26/20 1600 11/26/20 1934 11/26/20 2040 11/27/20 1100  PHART 7.082* 7.194* 7.368 7.402  PCO2ART >120* 91.0* 54.5* 47.0  PO2ART 70.4* <32.00* 73.9* 80.7*  HCO3 35.1* 33.7* 30.6* 28.6*  O2SAT 92.4 29.7 96.9 97.3    Liver Function Tests: No results for input(s): AST, ALT, ALKPHOS, BILITOT, PROT, ALBUMIN in the last 168 hours. No results for input(s): LIPASE, AMYLASE in the last 168 hours. No results for input(s): AMMONIA in the last 168 hours.  CBC: Recent Labs  Lab 11/26/20 0941 11/27/20 0746 11/28/20 0358  WBC 15.7* 29.2* 22.8*  HGB 10.8* 10.0* 8.4*  HCT 36.2* 31.5* 26.2*  MCV 101.1* 97.8 95.6  PLT 235 229 211     Cardiac Enzymes: No results for input(s): CKTOTAL, CKMB, CKMBINDEX, TROPONINI in the last 168 hours.  BNP (last 3 results) Recent Labs    01/04/20 1411 07/03/20 1654 11/08/20 0026  BNP 125.3* 118.4* 269.8*    ProBNP (last 3 results) No results for input(s): PROBNP in the last 8760 hours.  Radiological Exams: DG Abd 1 View  Result Date: 11/26/2020 CLINICAL DATA:  Vomiting and shortness of breath. EXAM: ABDOMEN - 1 VIEW COMPARISON:  Abdominal radiograph November 23, 2020. FINDINGS: Layering bilateral pleural effusions with underlying pulmonary consolidation. Gastrostomy tube projects over the upper abdomen. Gas is demonstrated within nondilated loops of large and small bowel in a nonobstructed pattern. Postsurgical changes within the pelvis. Postsurgical changes involving the thoracic and lumbar spine. Prior abdominal hernia mesh repair. IMPRESSION: Nonobstructed bowel gas pattern. Electronically Signed   By: Lovey Newcomer M.D.   On: 11/26/2020 10:12   DG CHEST PORT 1 VIEW  Result Date: 11/27/2020 CLINICAL DATA:  80 year old male with respiratory failure. History of lung cancer. EXAM: PORTABLE CHEST 1 VIEW COMPARISON:  11/26/2020 portable chest and earlier. FINDINGS: Portable AP semi upright view at 0941 hours. Stable tracheostomy. Continued low lung volumes. Improved right lung base ventilation, with less veiling opacity there now. But at the same time increased patchy and confluent left lung base opacity. No pneumothorax. No pulmonary edema. Stable cardiac size and mediastinal contours. Extensive thoracolumbar spinal hardware. Negative visible bowel gas pattern. IMPRESSION: Continued low lung volumes with improved  right but decreased left lung base ventilation since yesterday. Left lung base opacity could reflect worsening atelectasis, or infection. Electronically Signed   By: Genevie Ann M.D.   On: 11/27/2020 10:12   DG Chest Port 1 View  Result Date: 11/26/2020 CLINICAL DATA:  Vomiting and  shortness of breath. EXAM: PORTABLE CHEST 1 VIEW COMPARISON:  Chest radiograph 11/11/2020. FINDINGS: Tracheostomy tube tip projects in the mid trachea. Monitoring leads overlie the patient. Stable cardiac and mediastinal contours. Elevation the right hemidiaphragm. Small bilateral pleural effusions with underlying opacities. No pneumothorax. IMPRESSION: Small bilateral pleural effusions with underlying opacities which may represent atelectasis or infection. Electronically Signed   By: Lovey Newcomer M.D.   On: 11/26/2020 10:14    Assessment/Plan Active Problems:   Adenocarcinoma of left lung, stage 1 (HCC)   Acute on chronic respiratory failure with hypoxia (HCC)   Atrial fibrillation (HCC)   Supraglottic stenosis   Acute on chronic respiratory failure with hypoxia respiratory therapy will reassess again today he is looking much better now reviewed the chest x-ray still somewhat fluid overloaded may benefit from some diuresis.  The right diaphragm looks better with improved aeration Chronic atrial fibrillation rate now rate controlled we will continue to monitor Adenocarcinoma lung supportive care we will continue to follow along closely Supraglottic stenosis no change supportive care May need an evaluation by ENT at some point   I have personally seen and evaluated the patient, evaluated laboratory and imaging results, formulated the assessment and plan and placed orders. The Patient requires high complexity decision making with multiple systems involvement.  Rounds were done with the Respiratory Therapy Director and Staff therapists and discussed with nursing staff also.  Allyne Gee, MD [facility] Pulmonary Critical Care Medicine Sleep Medicine

## 2020-11-29 DIAGNOSIS — C3492 Malignant neoplasm of unspecified part of left bronchus or lung: Secondary | ICD-10-CM | POA: Diagnosis not present

## 2020-11-29 DIAGNOSIS — J9621 Acute and chronic respiratory failure with hypoxia: Secondary | ICD-10-CM | POA: Diagnosis not present

## 2020-11-29 DIAGNOSIS — I480 Paroxysmal atrial fibrillation: Secondary | ICD-10-CM | POA: Diagnosis not present

## 2020-11-29 DIAGNOSIS — J386 Stenosis of larynx: Secondary | ICD-10-CM | POA: Diagnosis not present

## 2020-11-29 LAB — CULTURE, RESPIRATORY W GRAM STAIN: Culture: NORMAL

## 2020-11-29 LAB — URINE CULTURE: Culture: >=100000 — AB

## 2020-11-29 NOTE — Progress Notes (Signed)
Pulmonary [place]  PROGRESS NOTE     Devin [name]  GYK:[id number]  DOB: 1941/05/30   DOA: 11/30/2020  Referring Physician: Merton Border, MD  HPI: Devin [name] is a 80 y.o. male being followed for ventilator/airway/oxygen weaning Acute on Chronic Respiratory Failure.  Patient is resting comfortably right now without distress on 3 about 4 hours of pressure support now is back on the ventilator  Medications: Reviewed on Rounds  Physical Exam:  Vitals: Temperature is 98.2 pulse 102 respiratory 22 blood pressure is 146/65 saturations 100%  Ventilator Settings currently is on assist control FiO2 28% tidal volume 530  General: Comfortable at this time Neck: supple Cardiovascular: no malignant arrhythmias Respiratory: Scattered rhonchi expansion is equal at this time. Skin: no rash seen on limited exam Musculoskeletal: No gross abnormality Psychiatric:unable to assess Neurologic:no involuntary movements         Lab Data:   Basic Metabolic Panel: Recent Labs  Lab 11/26/20 0941 11/27/20 0746 11/28/20 0358  NA 132* 134* 132*  K 5.1 5.4* 4.2  CL 93* 93* 92*  CO2 35* 30 33*  GLUCOSE 195* 118* 114*  BUN 26* 33* 33*  CREATININE 0.87 1.16 1.01  CALCIUM 9.2 9.3 8.9    ABG: Recent Labs  Lab 11/26/20 1600 11/26/20 1934 11/26/20 2040 11/27/20 1100  PHART 7.082* 7.194* 7.368 7.402  PCO2ART >120* 91.0* 54.5* 47.0  PO2ART 70.4* <32.00* 73.9* 80.7*  HCO3 35.1* 33.7* 30.6* 28.6*  O2SAT 92.4 29.7 96.9 97.3    Liver Function Tests: No results for input(s): AST, ALT, ALKPHOS, BILITOT, PROT, ALBUMIN in the last 168 hours. No results for input(s): LIPASE, AMYLASE in the last 168 hours. No results for input(s): AMMONIA in the last 168 hours.  CBC: Recent Labs  Lab 11/26/20 0941 11/27/20 0746 11/28/20 0358  WBC 15.7* 29.2* 22.8*  HGB 10.8* 10.0* 8.4*  HCT 36.2* 31.5* 26.2*  MCV  101.1* 97.8 95.6  PLT 235 229 211    Cardiac Enzymes: No results for input(s): CKTOTAL, CKMB, CKMBINDEX, TROPONINI in the last 168 hours.  BNP (last 3 results) Recent Labs    01/04/20 1411 07/03/20 1654 11/08/20 0026  BNP 125.3* 118.4* 269.8*    ProBNP (last 3 results) No results for input(s): PROBNP in the last 8760 hours.  Radiological Exams: No results found.  Assessment/Plan Active Problems:   Adenocarcinoma of left lung, stage 1 (HCC)   Acute on chronic respiratory failure with hypoxia (HCC)   Atrial fibrillation (HCC)   Supraglottic stenosis   Acute on chronic respiratory failure hypoxia we will continue with weaning on pressure support as tolerated patient did do 4 hours we will try to advance that further tomorrow. Adenocarcinoma lung supportive care we will continue with supportive care Chronic atrial fibrillation rate is controlled at this time Supraglottic stenosis patient actually did do well with capping prior to getting sick we will reassess again once we have the patient back off the ventilator.   I have personally seen and evaluated the patient, evaluated laboratory and imaging results, formulated the assessment and plan and placed orders. The Patient requires high complexity decision making with multiple systems involvement.  Rounds were done with the Respiratory Therapy Director and Staff therapists and discussed with nursing staff also.  Allyne Gee, MD [facility] Pulmonary Critical Care Medicine Sleep Medicine

## 2020-11-30 DIAGNOSIS — C3492 Malignant neoplasm of unspecified part of left bronchus or lung: Secondary | ICD-10-CM | POA: Diagnosis not present

## 2020-11-30 DIAGNOSIS — I480 Paroxysmal atrial fibrillation: Secondary | ICD-10-CM | POA: Diagnosis not present

## 2020-11-30 DIAGNOSIS — J9621 Acute and chronic respiratory failure with hypoxia: Secondary | ICD-10-CM | POA: Diagnosis not present

## 2020-11-30 DIAGNOSIS — J386 Stenosis of larynx: Secondary | ICD-10-CM | POA: Diagnosis not present

## 2020-11-30 LAB — CBC
HCT: 24.2 % — ABNORMAL LOW (ref 39.0–52.0)
Hemoglobin: 8.1 g/dL — ABNORMAL LOW (ref 13.0–17.0)
MCH: 30.9 pg (ref 26.0–34.0)
MCHC: 33.5 g/dL (ref 30.0–36.0)
MCV: 92.4 fL (ref 80.0–100.0)
Platelets: 181 10*3/uL (ref 150–400)
RBC: 2.62 MIL/uL — ABNORMAL LOW (ref 4.22–5.81)
RDW: 14.8 % (ref 11.5–15.5)
WBC: 13.7 10*3/uL — ABNORMAL HIGH (ref 4.0–10.5)
nRBC: 0 % (ref 0.0–0.2)

## 2020-11-30 LAB — BASIC METABOLIC PANEL
Anion gap: 12 (ref 5–15)
BUN: 23 mg/dL (ref 8–23)
CO2: 25 mmol/L (ref 22–32)
Calcium: 8.4 mg/dL — ABNORMAL LOW (ref 8.9–10.3)
Chloride: 93 mmol/L — ABNORMAL LOW (ref 98–111)
Creatinine, Ser: 0.95 mg/dL (ref 0.61–1.24)
GFR, Estimated: >60 mL/min (ref >60)
Glucose, Bld: 133 mg/dL — ABNORMAL HIGH (ref 70–99)
Potassium: 3 mmol/L — ABNORMAL LOW (ref 3.5–5.1)
Sodium: 130 mmol/L — ABNORMAL LOW (ref 135–145)

## 2020-11-30 NOTE — Progress Notes (Signed)
Pulmonary [place]  PROGRESS NOTE     Devin [name]  JOA:[id number]  DOB: [birth year]   DOA: 12/07/2020  Referring Physician: Merton Border, MD  HPI: Devin [name] is a 80 y.o. male being followed for ventilator/airway/oxygen weaning Acute on Chronic Respiratory Failure.  Comfortable right now without distress weaning on T collar today's goal is 8 hours  Medications: Reviewed on Rounds  Physical Exam:  Vitals: Temperature is 97.8 pulse 80 respiratory 21 blood pressure is 140/58 saturations 100%  Ventilator Settings on T collar on 28% FiO2  General: Comfortable at this time Neck: supple Cardiovascular: no malignant arrhythmias Respiratory: No rhonchi very coarse percent Skin: no rash seen on limited exam Musculoskeletal: No gross abnormality Psychiatric:unable to assess Neurologic:no involuntary movements         Lab Data:   Basic Metabolic Panel: Recent Labs  Lab 11/26/20 0941 11/27/20 0746 11/28/20 0358 11/30/20 0353  NA 132* 134* 132* 130*  K 5.1 5.4* 4.2 3.0*  CL 93* 93* 92* 93*  CO2 35* 30 33* 25  GLUCOSE 195* 118* 114* 133*  BUN 26* 33* 33* 23  CREATININE 0.87 1.16 1.01 0.95  CALCIUM 9.2 9.3 8.9 8.4*    ABG: Recent Labs  Lab 11/26/20 1600 11/26/20 1934 11/26/20 2040 11/27/20 1100  PHART 7.082* 7.194* 7.368 7.402  PCO2ART >120* 91.0* 54.5* 47.0  PO2ART 70.4* <32.00* 73.9* 80.7*  HCO3 35.1* 33.7* 30.6* 28.6*  O2SAT 92.4 29.7 96.9 97.3    Liver Function Tests: No results for input(s): AST, ALT, ALKPHOS, BILITOT, PROT, ALBUMIN in the last 168 hours. No results for input(s): LIPASE, AMYLASE in the last 168 hours. No results for input(s): AMMONIA in the last 168 hours.  CBC: Recent Labs  Lab 11/26/20 0941 11/27/20 0746 11/28/20 0358 11/30/20 0353  WBC 15.7* 29.2* 22.8* 13.7*  HGB 10.8* 10.0* 8.4* 8.1*  HCT 36.2* 31.5* 26.2* 24.2*  MCV 101.1* 97.8 95.6  92.4  PLT 235 229 211 181    Cardiac Enzymes: No results for input(s): CKTOTAL, CKMB, CKMBINDEX, TROPONINI in the last 168 hours.  BNP (last 3 results) Recent Labs    01/04/20 1411 07/03/20 1654 11/08/20 0026  BNP 125.3* 118.4* 269.8*    ProBNP (last 3 results) No results for input(s): PROBNP in the last 8760 hours.  Radiological Exams: No results found.  Assessment/Plan Active Problems:   Adenocarcinoma of left lung, stage 1 (HCC)   Acute on chronic respiratory failure with hypoxia (HCC)   Atrial fibrillation (HCC)   Supraglottic stenosis   Acute on chronic respiratory failure hypoxia plan is going to be to continue with the T-piece patient is on 8-hour goal. Adenocarcinoma status postresection Atrial fibrillation rate is controlled at this time Subglottic stenosis we will continue to monitor the patient closely   I have personally seen and evaluated the patient, evaluated laboratory and imaging results, formulated the assessment and plan and placed orders. The Patient requires high complexity decision making with multiple systems involvement.  Rounds were done with the Respiratory Therapy Director and Staff therapists and discussed with nursing staff also.  Allyne Gee, MD [facility] Pulmonary Critical Care Medicine Sleep Medicine

## 2020-12-01 ENCOUNTER — Other Ambulatory Visit (HOSPITAL_COMMUNITY): Payer: Self-pay

## 2020-12-01 DIAGNOSIS — J9621 Acute and chronic respiratory failure with hypoxia: Secondary | ICD-10-CM

## 2020-12-01 DIAGNOSIS — C3492 Malignant neoplasm of unspecified part of left bronchus or lung: Secondary | ICD-10-CM

## 2020-12-01 DIAGNOSIS — J386 Stenosis of larynx: Secondary | ICD-10-CM | POA: Diagnosis not present

## 2020-12-01 DIAGNOSIS — I480 Paroxysmal atrial fibrillation: Secondary | ICD-10-CM

## 2020-12-01 LAB — CBC
HCT: 28.2 % — ABNORMAL LOW (ref 39.0–52.0)
Hemoglobin: 9.2 g/dL — ABNORMAL LOW (ref 13.0–17.0)
MCH: 30.3 pg (ref 26.0–34.0)
MCHC: 32.6 g/dL (ref 30.0–36.0)
MCV: 92.8 fL (ref 80.0–100.0)
Platelets: 197 10*3/uL (ref 150–400)
RBC: 3.04 MIL/uL — ABNORMAL LOW (ref 4.22–5.81)
RDW: 14.9 % (ref 11.5–15.5)
WBC: 9.9 10*3/uL (ref 4.0–10.5)
nRBC: 0 % (ref 0.0–0.2)

## 2020-12-01 LAB — CULTURE, BLOOD (ROUTINE X 2)
Culture: NO GROWTH
Culture: NO GROWTH

## 2020-12-01 LAB — POTASSIUM: Potassium: 3.3 mmol/L — ABNORMAL LOW (ref 3.5–5.1)

## 2020-12-01 NOTE — Progress Notes (Signed)
Pulmonary [name]  PROGRESS NOTE     Devin [name]  GYB:[id number]  DOB: 12/12/1940   DOA: 12/04/2020  Referring Physician: Merton Border, MD  HPI: Devin [name] is a 80 y.o. male being followed for ventilator/airway/oxygen weaning Acute on Chronic Respiratory Failure.  Patient is comfortable right now without distress has been on pressure support currently on a 12-hour goal  Medications: Reviewed on Rounds  Physical Exam:  Vitals: Temperature 96.7 pulse 72 respiratory rate is 20 blood pressure is 145/74 saturations 100%  Ventilator Settings pressure support with a pressure of 12/5  General: Comfortable at this time Neck: supple Cardiovascular: no malignant arrhythmias Respiratory: Scattered rhonchi expansion is equal Skin: no rash seen on limited exam Musculoskeletal: No gross abnormality Psychiatric:unable to assess Neurologic:no involuntary movements         Lab Data:   Basic Metabolic Panel: Recent Labs  Lab 11/26/20 0941 11/27/20 0746 11/28/20 0358 11/30/20 0353 12/01/20 0413  NA 132* 134* 132* 130*  --   K 5.1 5.4* 4.2 3.0* 3.3*  CL 93* 93* 92* 93*  --   CO2 35* 30 33* 25  --   GLUCOSE 195* 118* 114* 133*  --   BUN 26* 33* 33* 23  --   CREATININE 0.87 1.16 1.01 0.95  --   CALCIUM 9.2 9.3 8.9 8.4*  --     ABG: Recent Labs  Lab 11/26/20 1600 11/26/20 1934 11/26/20 2040 11/27/20 1100  PHART 7.082* 7.194* 7.368 7.402  PCO2ART >120* 91.0* 54.5* 47.0  PO2ART 70.4* <32.00* 73.9* 80.7*  HCO3 35.1* 33.7* 30.6* 28.6*  O2SAT 92.4 29.7 96.9 97.3    Liver Function Tests: No results for input(s): AST, ALT, ALKPHOS, BILITOT, PROT, ALBUMIN in the last 168 hours. No results for input(s): LIPASE, AMYLASE in the last 168 hours. No results for input(s): AMMONIA in the last 168 hours.  CBC: Recent Labs  Lab 11/26/20 0941 11/27/20 0746 11/28/20 0358 11/30/20 0353  12/01/20 0413  WBC 15.7* 29.2* 22.8* 13.7* 9.9  HGB 10.8* 10.0* 8.4* 8.1* 9.2*  HCT 36.2* 31.5* 26.2* 24.2* 28.2*  MCV 101.1* 97.8 95.6 92.4 92.8  PLT 235 229 211 181 197    Cardiac Enzymes: No results for input(s): CKTOTAL, CKMB, CKMBINDEX, TROPONINI in the last 168 hours.  BNP (last 3 results) Recent Labs    01/04/20 1411 07/03/20 1654 11/08/20 0026  BNP 125.3* 118.4* 269.8*    ProBNP (last 3 results) No results for input(s): PROBNP in the last 8760 hours.  Radiological Exams: No results found.  Assessment/Plan Active Problems:   Adenocarcinoma of left lung, stage 1 (HCC)   Acute on chronic respiratory failure with hypoxia (HCC)   Atrial fibrillation (HCC)   Supraglottic stenosis   Acute on chronic respiratory failure hypoxia plan is to continue with the wean pressure support goal of 12/5 for 12 hours Adenocarcinoma of the lung status post resection Atrial fibrillation rate is controlled Supraglottic stenosis supportive care no changes noted   I have personally seen and evaluated the patient, evaluated laboratory and imaging results, formulated the assessment and plan and placed orders. The Patient requires high complexity decision making with multiple systems involvement.  Rounds were done with the Respiratory Therapy Director and Staff therapists and discussed with nursing staff also.  Allyne Gee, MD [facility] Pulmonary Critical Care Medicine Sleep Medicine

## 2020-12-02 LAB — BLOOD GAS, ARTERIAL
Acid-Base Excess: 4.3 mmol/L — ABNORMAL HIGH (ref 0.0–2.0)
Bicarbonate: 29.2 mmol/L — ABNORMAL HIGH (ref 20.0–28.0)
Drawn by: 164
FIO2: 28
O2 Saturation: 95.7 %
Patient temperature: 37
pCO2 arterial: 51.5 mmHg — ABNORMAL HIGH (ref 32.0–48.0)
pH, Arterial: 7.372 (ref 7.350–7.450)
pO2, Arterial: 78.5 mmHg — ABNORMAL LOW (ref 83.0–108.0)

## 2020-12-03 LAB — CBC
HCT: 28.8 % — ABNORMAL LOW (ref 39.0–52.0)
Hemoglobin: 9.3 g/dL — ABNORMAL LOW (ref 13.0–17.0)
MCH: 29.7 pg (ref 26.0–34.0)
MCHC: 32.3 g/dL (ref 30.0–36.0)
MCV: 92 fL (ref 80.0–100.0)
Platelets: 224 10*3/uL (ref 150–400)
RBC: 3.13 MIL/uL — ABNORMAL LOW (ref 4.22–5.81)
RDW: 14.9 % (ref 11.5–15.5)
WBC: 9.4 10*3/uL (ref 4.0–10.5)
nRBC: 0 % (ref 0.0–0.2)

## 2020-12-03 LAB — BASIC METABOLIC PANEL
Anion gap: 8 (ref 5–15)
BUN: 17 mg/dL (ref 8–23)
CO2: 29 mmol/L (ref 22–32)
Calcium: 9.1 mg/dL (ref 8.9–10.3)
Chloride: 96 mmol/L — ABNORMAL LOW (ref 98–111)
Creatinine, Ser: 0.98 mg/dL (ref 0.61–1.24)
GFR, Estimated: >60 mL/min (ref >60)
Glucose, Bld: 123 mg/dL — ABNORMAL HIGH (ref 70–99)
Potassium: 3.5 mmol/L (ref 3.5–5.1)
Sodium: 133 mmol/L — ABNORMAL LOW (ref 135–145)

## 2020-12-03 LAB — MAGNESIUM: Magnesium: 1.7 mg/dL (ref 1.7–2.4)

## 2020-12-04 ENCOUNTER — Other Ambulatory Visit (HOSPITAL_COMMUNITY): Payer: Self-pay

## 2020-12-04 DIAGNOSIS — J9621 Acute and chronic respiratory failure with hypoxia: Secondary | ICD-10-CM | POA: Diagnosis not present

## 2020-12-04 DIAGNOSIS — C3492 Malignant neoplasm of unspecified part of left bronchus or lung: Secondary | ICD-10-CM | POA: Diagnosis not present

## 2020-12-04 DIAGNOSIS — I480 Paroxysmal atrial fibrillation: Secondary | ICD-10-CM | POA: Diagnosis not present

## 2020-12-04 DIAGNOSIS — J386 Stenosis of larynx: Secondary | ICD-10-CM | POA: Diagnosis not present

## 2020-12-04 LAB — TROPONIN I (HIGH SENSITIVITY): Troponin I (High Sensitivity): 6 ng/L (ref <18)

## 2020-12-04 LAB — BLOOD GAS, ARTERIAL
Acid-Base Excess: 2 mmol/L (ref 0.0–2.0)
Bicarbonate: 26.6 mmol/L (ref 20.0–28.0)
FIO2: 40
O2 Saturation: 98.6 %
Patient temperature: 37
pCO2 arterial: 46.3 mmHg (ref 32.0–48.0)
pH, Arterial: 7.378 (ref 7.350–7.450)
pO2, Arterial: 119 mmHg — ABNORMAL HIGH (ref 83.0–108.0)

## 2020-12-04 LAB — BASIC METABOLIC PANEL
Anion gap: 8 (ref 5–15)
BUN: 18 mg/dL (ref 8–23)
CO2: 27 mmol/L (ref 22–32)
Calcium: 8.6 mg/dL — ABNORMAL LOW (ref 8.9–10.3)
Chloride: 99 mmol/L (ref 98–111)
Creatinine, Ser: 0.98 mg/dL (ref 0.61–1.24)
GFR, Estimated: >60 mL/min (ref >60)
Glucose, Bld: 150 mg/dL — ABNORMAL HIGH (ref 70–99)
Potassium: 3.8 mmol/L (ref 3.5–5.1)
Sodium: 134 mmol/L — ABNORMAL LOW (ref 135–145)

## 2020-12-04 LAB — MAGNESIUM: Magnesium: 2 mg/dL (ref 1.7–2.4)

## 2020-12-05 ENCOUNTER — Ambulatory Visit (HOSPITAL_COMMUNITY): Payer: Medicare Other | Admitting: Physician Assistant

## 2020-12-05 DIAGNOSIS — J9620 Acute and chronic respiratory failure, unspecified whether with hypoxia or hypercapnia: Secondary | ICD-10-CM | POA: Diagnosis not present

## 2020-12-05 DIAGNOSIS — J9621 Acute and chronic respiratory failure with hypoxia: Secondary | ICD-10-CM | POA: Diagnosis not present

## 2020-12-05 DIAGNOSIS — I480 Paroxysmal atrial fibrillation: Secondary | ICD-10-CM | POA: Diagnosis not present

## 2020-12-05 DIAGNOSIS — J386 Stenosis of larynx: Secondary | ICD-10-CM | POA: Diagnosis not present

## 2020-12-05 DIAGNOSIS — R590 Localized enlarged lymph nodes: Secondary | ICD-10-CM | POA: Diagnosis not present

## 2020-12-05 DIAGNOSIS — C3492 Malignant neoplasm of unspecified part of left bronchus or lung: Secondary | ICD-10-CM | POA: Diagnosis not present

## 2020-12-05 LAB — BASIC METABOLIC PANEL
Anion gap: 9 (ref 5–15)
BUN: 20 mg/dL (ref 8–23)
CO2: 26 mmol/L (ref 22–32)
Calcium: 8.8 mg/dL — ABNORMAL LOW (ref 8.9–10.3)
Chloride: 98 mmol/L (ref 98–111)
Creatinine, Ser: 0.96 mg/dL (ref 0.61–1.24)
GFR, Estimated: >60 mL/min (ref >60)
Glucose, Bld: 120 mg/dL — ABNORMAL HIGH (ref 70–99)
Potassium: 5 mmol/L (ref 3.5–5.1)
Sodium: 133 mmol/L — ABNORMAL LOW (ref 135–145)

## 2020-12-05 LAB — CBC
HCT: 29.8 % — ABNORMAL LOW (ref 39.0–52.0)
Hemoglobin: 9.5 g/dL — ABNORMAL LOW (ref 13.0–17.0)
MCH: 30.1 pg (ref 26.0–34.0)
MCHC: 31.9 g/dL (ref 30.0–36.0)
MCV: 94.3 fL (ref 80.0–100.0)
Platelets: 250 10*3/uL (ref 150–400)
RBC: 3.16 MIL/uL — ABNORMAL LOW (ref 4.22–5.81)
RDW: 14.9 % (ref 11.5–15.5)
WBC: 8.8 10*3/uL (ref 4.0–10.5)
nRBC: 0 % (ref 0.0–0.2)

## 2020-12-05 NOTE — Progress Notes (Signed)
Devin [name]  PROGRESS NOTE     Devin [name]  JIR:[id number]  DOB: Oct 17, 1940   DOA: 11/24/2020  Referring Physician: Merton Border, MD  HPI: Devin [name] is a 80 y.o. male being followed for ventilator/airway/oxygen weaning Acute on Chronic Respiratory Failure.  Patient is comfortable right now without distress has been afebrile  Medications: Reviewed on Rounds  Physical Exam:  Vitals: Temperature is 97.3 pulse 74 respiratory 30 blood pressure is 110/67 saturations 100%  Ventilator Settings patient is on pressure support FiO2 35% pressure 12/5  General: Comfortable at this time Neck: supple Cardiovascular: no malignant arrhythmias Respiratory: No rhonchi very coarse breath sounds Skin: no rash seen on limited exam Musculoskeletal: No gross abnormality Psychiatric:unable to assess Neurologic:no involuntary movements         Lab Data:   Basic Metabolic Panel: Recent Labs  Lab 11/30/20 0353 12/01/20 0413 12/03/20 0353 12/04/20 1428 12/05/20 0426  NA 130*  --  133* 134* 133*  K 3.0* 3.3* 3.5 3.8 5.0  CL 93*  --  96* 99 98  CO2 25  --  29 27 26   GLUCOSE 133*  --  123* 150* 120*  BUN 23  --  17 18 20   CREATININE 0.95  --  0.98 0.98 0.96  CALCIUM 8.4*  --  9.1 8.6* 8.8*  MG  --   --  1.7 2.0  --     ABG: Recent Labs  Lab 12/02/20 1053 12/04/20 1420  PHART 7.372 7.378  PCO2ART 51.5* 46.3  PO2ART 78.5* 119*  HCO3 29.2* 26.6  O2SAT 95.7 98.6    Liver Function Tests: No results for input(s): AST, ALT, ALKPHOS, BILITOT, PROT, ALBUMIN in the last 168 hours. No results for input(s): LIPASE, AMYLASE in the last 168 hours. No results for input(s): AMMONIA in the last 168 hours.  CBC: Recent Labs  Lab 11/30/20 0353 12/01/20 0413 12/03/20 0353 12/05/20 0426  WBC 13.7* 9.9 9.4 8.8  HGB 8.1* 9.2* 9.3* 9.5*  HCT 24.2* 28.2* 28.8* 29.8*  MCV 92.4 92.8 92.0 94.3   PLT 181 197 224 250    Cardiac Enzymes: No results for input(s): CKTOTAL, CKMB, CKMBINDEX, TROPONINI in the last 168 hours.  BNP (last 3 results) Recent Labs    01/04/20 1411 07/03/20 1654 11/08/20 0026  BNP 125.3* 118.4* 269.8*    ProBNP (last 3 results) No results for input(s): PROBNP in the last 8760 hours.  Radiological Exams: DG Chest Port 1 View  Result Date: 12/04/2020 CLINICAL DATA:  Hypoxemia. EXAM: PORTABLE CHEST 1 VIEW COMPARISON:  December 01, 2020 FINDINGS: Low lung volumes are seen. A tracheostomy tube is in place. Stable areas of linear atelectasis and/or infiltrate are noted within the bilateral lung bases, left greater than right. There is a small, stable left pleural effusion. No pneumothorax is identified. The heart size and mediastinal contours are within normal limits. Postoperative changes are seen within the mid and lower thoracic spine. IMPRESSION: 1. Stable bibasilar atelectasis and/or infiltrate, left greater than right, with a small, stable left pleural effusion. Electronically Signed   By: Virgina Norfolk M.D.   On: 12/04/2020 15:50    Assessment/Plan Active Problems:   Adenocarcinoma of left lung, stage 1 (HCC)   Acute on chronic respiratory failure with hypoxia (HCC)   Atrial fibrillation (HCC)   Supraglottic stenosis   Acute on chronic respiratory failure with hypoxia patient is back on the wean goal of  16 hours on pressure support will advance as tolerated. Supraglottic subglottic stenosis none seen on the last CT ENT is going to take a look Atrial fibrillation rate is controlled Adenocarcinoma of the lung status postresection   I have personally seen and evaluated the patient, evaluated laboratory and imaging results, formulated the assessment and plan and placed orders. The Patient requires high complexity decision making with multiple systems involvement.  Rounds were done with the Respiratory Therapy Director and Staff therapists and  discussed with nursing staff also.  Allyne Gee, MD [facility] Critical Care Medicine Sleep Medicine

## 2020-12-05 NOTE — Progress Notes (Signed)
Pulmonary [place]  PROGRESS NOTE     Devin [name]  RWE:[id number]  DOB: [birth year]   DOA: 11/21/2020  Referring Physician: Merton Border, MD  HPI: Devin [name] is a 80 y.o. male being followed for ventilator/airway/oxygen weaning Acute on Chronic Respiratory Failure.  Patient is pressure support has been on 28% FiO2 appears to be doing okay supposed to do 16 hours on the wean  Medications: Reviewed on Rounds  Physical Exam:  Vitals: Temperature is 98.6 pulse 86 respiratory 21 blood pressure is 133/62 saturations 90%  Ventilator Settings patient currently is on pressure support FiO2 28% pressure 12/5  General: Comfortable at this time Neck: supple Cardiovascular: no malignant arrhythmias Respiratory: No rhonchi very coarse breath sounds Skin: no rash seen on limited exam Musculoskeletal: No gross abnormality Psychiatric:unable to assess Neurologic:no involuntary movements         Lab Data:   Basic Metabolic Panel: Recent Labs  Lab 11/30/20 0353 12/01/20 0413 12/03/20 0353 12/04/20 1428 12/05/20 0426  NA 130*  --  133* 134* 133*  K 3.0* 3.3* 3.5 3.8 5.0  CL 93*  --  96* 99 98  CO2 25  --  29 27 26   GLUCOSE 133*  --  123* 150* 120*  BUN 23  --  17 18 20   CREATININE 0.95  --  0.98 0.98 0.96  CALCIUM 8.4*  --  9.1 8.6* 8.8*  MG  --   --  1.7 2.0  --     ABG: Recent Labs  Lab 12/02/20 1053 12/04/20 1420  PHART 7.372 7.378  PCO2ART 51.5* 46.3  PO2ART 78.5* 119*  HCO3 29.2* 26.6  O2SAT 95.7 98.6    Liver Function Tests: No results for input(s): AST, ALT, ALKPHOS, BILITOT, PROT, ALBUMIN in the last 168 hours. No results for input(s): LIPASE, AMYLASE in the last 168 hours. No results for input(s): AMMONIA in the last 168 hours.  CBC: Recent Labs  Lab 11/30/20 0353 12/01/20 0413 12/03/20 0353 12/05/20 0426  WBC 13.7* 9.9 9.4 8.8  HGB 8.1* 9.2* 9.3* 9.5*  HCT  24.2* 28.2* 28.8* 29.8*  MCV 92.4 92.8 92.0 94.3  PLT 181 197 224 250    Cardiac Enzymes: No results for input(s): CKTOTAL, CKMB, CKMBINDEX, TROPONINI in the last 168 hours.  BNP (last 3 results) Recent Labs    01/04/20 1411 07/03/20 1654 11/08/20 0026  BNP 125.3* 118.4* 269.8*    ProBNP (last 3 results) No results for input(s): PROBNP in the last 8760 hours.  Radiological Exams: DG Chest Port 1 View  Result Date: 12/04/2020 CLINICAL DATA:  Hypoxemia. EXAM: PORTABLE CHEST 1 VIEW COMPARISON:  December 01, 2020 FINDINGS: Low lung volumes are seen. A tracheostomy tube is in place. Stable areas of linear atelectasis and/or infiltrate are noted within the bilateral lung bases, left greater than right. There is a small, stable left pleural effusion. No pneumothorax is identified. The heart size and mediastinal contours are within normal limits. Postoperative changes are seen within the mid and lower thoracic spine. IMPRESSION: 1. Stable bibasilar atelectasis and/or infiltrate, left greater than right, with a small, stable left pleural effusion. Electronically Signed   By: Virgina Norfolk M.D.   On: 12/04/2020 15:50    Assessment/Plan Active Problems:   Adenocarcinoma of left lung, stage 1 (HCC)   Acute on chronic respiratory failure with hypoxia (HCC)   Atrial fibrillation (HCC)   Supraglottic stenosis   Acute on  chronic respiratory failure with hypoxia plan is going to be to continue to wean patient is doing pressure support weaning goal of 16 hours. Supraglottic subglottic stenosis reviewed the CT scans and they do not really show any stenosis.  ENT evaluation has been asked for will await Atrial fibrillation rate and rate is controlled we will continue to monitor closely. Adenocarcinoma supportive care we will continue with present management   I have personally seen and evaluated the patient, evaluated laboratory and imaging results, formulated the assessment and plan and placed  orders. The Patient requires high complexity decision making with multiple systems involvement.  Rounds were done with the Respiratory Therapy Director and Staff therapists and discussed with nursing staff also.  Allyne Gee, MD [facility] Pulmonary Critical Care Medicine Sleep Medicine

## 2020-12-06 DIAGNOSIS — J9621 Acute and chronic respiratory failure with hypoxia: Secondary | ICD-10-CM | POA: Diagnosis not present

## 2020-12-06 DIAGNOSIS — I480 Paroxysmal atrial fibrillation: Secondary | ICD-10-CM | POA: Diagnosis not present

## 2020-12-06 DIAGNOSIS — J386 Stenosis of larynx: Secondary | ICD-10-CM | POA: Diagnosis not present

## 2020-12-06 DIAGNOSIS — C3492 Malignant neoplasm of unspecified part of left bronchus or lung: Secondary | ICD-10-CM | POA: Diagnosis not present

## 2020-12-06 NOTE — Progress Notes (Signed)
Pulmonary [place]  PROGRESS NOTE     PIO EATHERLY  WNU:[id number]  DOB: December 09, 1940   DOA: 11/10/2020  Referring Physician: Merton Border, MD  HPI: Devin [name] is a 80 y.o. male being followed for ventilator/airway/oxygen weaning Acute on Chronic Respiratory Failure.  Patient is currently on T collar has been comfortable without distress at this time.  Medications: Reviewed on Rounds  Physical Exam:  Vitals: Temperature is 97.5 pulse 103 respiratory 25 blood pressure is 151/77 saturations 100%  Ventilator Settings on T collar FiO2 28%  General: Comfortable at this time Neck: supple Cardiovascular: no malignant arrhythmias Respiratory: No rhonchi very coarse breath Skin: no rash seen on limited exam Musculoskeletal: No gross abnormality Psychiatric:unable to assess Neurologic:no involuntary movements         Lab Data:   Basic Metabolic Panel: Recent Labs  Lab 11/30/20 0353 12/01/20 0413 12/03/20 0353 12/04/20 1428 12/05/20 0426  NA 130*  --  133* 134* 133*  K 3.0* 3.3* 3.5 3.8 5.0  CL 93*  --  96* 99 98  CO2 25  --  29 27 26   GLUCOSE 133*  --  123* 150* 120*  BUN 23  --  17 18 20   CREATININE 0.95  --  0.98 0.98 0.96  CALCIUM 8.4*  --  9.1 8.6* 8.8*  MG  --   --  1.7 2.0  --     ABG: Recent Labs  Lab 12/02/20 1053 12/04/20 1420  PHART 7.372 7.378  PCO2ART 51.5* 46.3  PO2ART 78.5* 119*  HCO3 29.2* 26.6  O2SAT 95.7 98.6    Liver Function Tests: No results for input(s): AST, ALT, ALKPHOS, BILITOT, PROT, ALBUMIN in the last 168 hours. No results for input(s): LIPASE, AMYLASE in the last 168 hours. No results for input(s): AMMONIA in the last 168 hours.  CBC: Recent Labs  Lab 11/30/20 0353 12/01/20 0413 12/03/20 0353 12/05/20 0426  WBC 13.7* 9.9 9.4 8.8  HGB 8.1* 9.2* 9.3* 9.5*  HCT 24.2* 28.2* 28.8* 29.8*  MCV 92.4 92.8 92.0 94.3  PLT 181 197 224 250     Cardiac Enzymes: No results for input(s): CKTOTAL, CKMB, CKMBINDEX, TROPONINI in the last 168 hours.  BNP (last 3 results) Recent Labs    01/04/20 1411 07/03/20 1654 11/08/20 0026  BNP 125.3* 118.4* 269.8*    ProBNP (last 3 results) No results for input(s): PROBNP in the last 8760 hours.  Radiological Exams: DG Chest Port 1 View  Result Date: 12/04/2020 CLINICAL DATA:  Hypoxemia. EXAM: PORTABLE CHEST 1 VIEW COMPARISON:  December 01, 2020 FINDINGS: Low lung volumes are seen. A tracheostomy tube is in place. Stable areas of linear atelectasis and/or infiltrate are noted within the bilateral lung bases, left greater than right. There is a small, stable left pleural effusion. No pneumothorax is identified. The heart size and mediastinal contours are within normal limits. Postoperative changes are seen within the mid and lower thoracic spine. IMPRESSION: 1. Stable bibasilar atelectasis and/or infiltrate, left greater than right, with a small, stable left pleural effusion. Electronically Signed   By: Virgina Norfolk M.D.   On: 12/04/2020 15:50    Assessment/Plan Active Problems:   Adenocarcinoma of left lung, stage 1 (HCC)   Acute on chronic respiratory failure with hypoxia (HCC)   Atrial fibrillation (HCC)   Supraglottic stenosis   Acute on chronic respiratory failure with hypoxia patient is on T collar on 28% FiO2 goal  is for 2 hours. 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 The Patient requires high complexity decision making with multiple systems involvement.  Rounds were done with the Respiratory  Therapy Director and Staff therapists and discussed with nursing staff also.  Allyne Gee, MD [facility] Pulmonary Critical Care Medicine Sleep Medicine

## 2020-12-07 LAB — BASIC METABOLIC PANEL
Anion gap: 9 (ref 5–15)
BUN: 25 mg/dL — ABNORMAL HIGH (ref 8–23)
CO2: 27 mmol/L (ref 22–32)
Calcium: 9.3 mg/dL (ref 8.9–10.3)
Chloride: 96 mmol/L — ABNORMAL LOW (ref 98–111)
Creatinine, Ser: 0.97 mg/dL (ref 0.61–1.24)
GFR, Estimated: >60 mL/min (ref >60)
Glucose, Bld: 160 mg/dL — ABNORMAL HIGH (ref 70–99)
Potassium: 4.6 mmol/L (ref 3.5–5.1)
Sodium: 132 mmol/L — ABNORMAL LOW (ref 135–145)

## 2020-12-07 LAB — CBC
HCT: 30 % — ABNORMAL LOW (ref 39.0–52.0)
Hemoglobin: 9.8 g/dL — ABNORMAL LOW (ref 13.0–17.0)
MCH: 29.8 pg (ref 26.0–34.0)
MCHC: 32.7 g/dL (ref 30.0–36.0)
MCV: 91.2 fL (ref 80.0–100.0)
Platelets: 334 10*3/uL (ref 150–400)
RBC: 3.29 MIL/uL — ABNORMAL LOW (ref 4.22–5.81)
RDW: 14.8 % (ref 11.5–15.5)
WBC: 13.7 10*3/uL — ABNORMAL HIGH (ref 4.0–10.5)
nRBC: 0 % (ref 0.0–0.2)

## 2020-12-08 DIAGNOSIS — I480 Paroxysmal atrial fibrillation: Secondary | ICD-10-CM | POA: Diagnosis not present

## 2020-12-08 DIAGNOSIS — J9621 Acute and chronic respiratory failure with hypoxia: Secondary | ICD-10-CM | POA: Diagnosis not present

## 2020-12-08 DIAGNOSIS — J386 Stenosis of larynx: Secondary | ICD-10-CM | POA: Diagnosis not present

## 2020-12-08 DIAGNOSIS — C3492 Malignant neoplasm of unspecified part of left bronchus or lung: Secondary | ICD-10-CM | POA: Diagnosis not present

## 2020-12-08 NOTE — Progress Notes (Signed)
Pulmonary [place]  PROGRESS NOTE     Devin [name]  HCW:[id number]  DOB: 1941/02/17   DOA: 11/10/2020  Referring Physician: Merton Border, MD  HPI: Devin [name] is a 80 y.o. male being followed for ventilator/airway/oxygen weaning Acute on Chronic Respiratory Failure.  Patient at this time is on T collar the goal is for 8 hours today he is doing better sitting up in the chair  Medications: Reviewed on Rounds  Physical Exam:  Vitals: Temperature is 98.6 pulse 88 respiratory rate is 20 blood pressure is 185/76 saturations 100%  Ventilator Settings currently on T collar FiO2 28%  General: Comfortable at this time Neck: supple Cardiovascular: no malignant arrhythmias Respiratory: No rhonchi very coarse breath sounds Skin: no rash seen on limited exam Musculoskeletal: No gross abnormality Psychiatric:unable to assess Neurologic:no involuntary movements         Lab Data:   Basic Metabolic Panel: Recent Labs  Lab 12/03/20 0353 12/04/20 1428 12/05/20 0426 12/07/20 0308  NA 133* 134* 133* 132*  K 3.5 3.8 5.0 4.6  CL 96* 99 98 96*  CO2 29 27 26 27   GLUCOSE 123* 150* 120* 160*  BUN 17 18 20  25*  CREATININE 0.98 0.98 0.96 0.97  CALCIUM 9.1 8.6* 8.8* 9.3  MG 1.7 2.0  --   --     ABG: Recent Labs  Lab 12/02/20 1053 12/04/20 1420  PHART 7.372 7.378  PCO2ART 51.5* 46.3  PO2ART 78.5* 119*  HCO3 29.2* 26.6  O2SAT 95.7 98.6    Liver Function Tests: No results for input(s): AST, ALT, ALKPHOS, BILITOT, PROT, ALBUMIN in the last 168 hours. No results for input(s): LIPASE, AMYLASE in the last 168 hours. No results for input(s): AMMONIA in the last 168 hours.  CBC: Recent Labs  Lab 12/03/20 0353 12/05/20 0426 12/07/20 0308  WBC 9.4 8.8 13.7*  HGB 9.3* 9.5* 9.8*  HCT 28.8* 29.8* 30.0*  MCV 92.0 94.3 91.2  PLT 224 250 334    Cardiac Enzymes: No results for input(s):  CKTOTAL, CKMB, CKMBINDEX, TROPONINI in the last 168 hours.  BNP (last 3 results) Recent Labs    01/04/20 1411 07/03/20 1654 11/08/20 0026  BNP 125.3* 118.4* 269.8*    ProBNP (last 3 results) No results for input(s): PROBNP in the last 8760 hours.  Radiological Exams: No results found.  Assessment/Plan Active Problems:   Adenocarcinoma of left lung, stage 1 (HCC)   Acute on chronic respiratory failure with hypoxia (HCC)   Atrial fibrillation (HCC)   Supraglottic stenosis   Acute on chronic respiratory failure with hypoxia plan is going to be to continue with the T collar goal of 8 hours today Adenocarcinoma of the lung supportive care we will continue to monitor closely. 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 The Patient requires high complexity decision making with multiple systems involvement.  Rounds were done with the Respiratory Therapy Director and Staff therapists and discussed with nursing staff also.  Allyne Gee, MD [facility] Pulmonary Critical Care Medicine Sleep Medicine

## 2020-12-08 DEATH — deceased

## 2020-12-09 ENCOUNTER — Other Ambulatory Visit (HOSPITAL_COMMUNITY): Payer: Self-pay

## 2020-12-09 LAB — BASIC METABOLIC PANEL
Anion gap: 13 (ref 5–15)
BUN: 31 mg/dL — ABNORMAL HIGH (ref 8–23)
CO2: 27 mmol/L (ref 22–32)
Calcium: 9.4 mg/dL (ref 8.9–10.3)
Chloride: 93 mmol/L — ABNORMAL LOW (ref 98–111)
Creatinine, Ser: 0.91 mg/dL (ref 0.61–1.24)
GFR, Estimated: >60 mL/min (ref >60)
Glucose, Bld: 154 mg/dL — ABNORMAL HIGH (ref 70–99)
Potassium: 4.1 mmol/L (ref 3.5–5.1)
Sodium: 133 mmol/L — ABNORMAL LOW (ref 135–145)

## 2020-12-09 LAB — CBC
HCT: 29.5 % — ABNORMAL LOW (ref 39.0–52.0)
Hemoglobin: 9.5 g/dL — ABNORMAL LOW (ref 13.0–17.0)
MCH: 29.4 pg (ref 26.0–34.0)
MCHC: 32.2 g/dL (ref 30.0–36.0)
MCV: 91.3 fL (ref 80.0–100.0)
Platelets: 410 10*3/uL — ABNORMAL HIGH (ref 150–400)
RBC: 3.23 MIL/uL — ABNORMAL LOW (ref 4.22–5.81)
RDW: 14.9 % (ref 11.5–15.5)
WBC: 12 10*3/uL — ABNORMAL HIGH (ref 4.0–10.5)
nRBC: 0 % (ref 0.0–0.2)

## 2020-12-11 DIAGNOSIS — I480 Paroxysmal atrial fibrillation: Secondary | ICD-10-CM | POA: Diagnosis not present

## 2020-12-11 DIAGNOSIS — C3492 Malignant neoplasm of unspecified part of left bronchus or lung: Secondary | ICD-10-CM | POA: Diagnosis not present

## 2020-12-11 DIAGNOSIS — J386 Stenosis of larynx: Secondary | ICD-10-CM | POA: Diagnosis not present

## 2020-12-11 DIAGNOSIS — J9621 Acute and chronic respiratory failure with hypoxia: Secondary | ICD-10-CM | POA: Diagnosis not present

## 2020-12-11 NOTE — Progress Notes (Signed)
Pulmonary [place]  PROGRESS NOTE     Devin [name]  AST:[id number]  DOB: 04-17-1941   DOA: 11/08/2020  Referring Physician: Merton Border, MD  HPI: Devin [name] is a 80 y.o. male being followed for ventilator/airway/oxygen weaning Acute on Chronic Respiratory Failure.  Patient at this time is on T collar looks good awake and alert he denied any specific complaints this morning on rounds  Medications: Reviewed on Rounds  Physical Exam:  Vitals: Temperature is 96.6 pulse 84 respiratory rate is 20 blood pressure is 144/63 saturations 99%  Ventilator Settings on T collar FiO2 is 28%  General: Comfortable at this time Neck: supple Cardiovascular: no malignant arrhythmias Respiratory: Scattered rhonchi expansion is equal Skin: no rash seen on limited exam Musculoskeletal: No gross abnormality Psychiatric:unable to assess Neurologic:no involuntary movements         Lab Data:   Basic Metabolic Panel: Recent Labs  Lab 12/04/20 1428 12/05/20 0426 12/07/20 0308 12/09/20 0423  NA 134* 133* 132* 133*  K 3.8 5.0 4.6 4.1  CL 99 98 96* 93*  CO2 27 26 27 27   GLUCOSE 150* 120* 160* 154*  BUN 18 20 25* 31*  CREATININE 0.98 0.96 0.97 0.91  CALCIUM 8.6* 8.8* 9.3 9.4  MG 2.0  --   --   --     ABG: Recent Labs  Lab 12/04/20 1420  PHART 7.378  PCO2ART 46.3  PO2ART 119*  HCO3 26.6  O2SAT 98.6    Liver Function Tests: No results for input(s): AST, ALT, ALKPHOS, BILITOT, PROT, ALBUMIN in the last 168 hours. No results for input(s): LIPASE, AMYLASE in the last 168 hours. No results for input(s): AMMONIA in the last 168 hours.  CBC: Recent Labs  Lab 12/05/20 0426 12/07/20 0308 12/09/20 0423  WBC 8.8 13.7* 12.0*  HGB 9.5* 9.8* 9.5*  HCT 29.8* 30.0* 29.5*  MCV 94.3 91.2 91.3  PLT 250 334 410*    Cardiac Enzymes: No results for input(s): CKTOTAL, CKMB, CKMBINDEX, TROPONINI in the  last 168 hours.  BNP (last 3 results) Recent Labs    01/04/20 1411 07/03/20 1654 11/08/20 0026  BNP 125.3* 118.4* 269.8*    ProBNP (last 3 results) No results for input(s): PROBNP in the last 8760 hours.  Radiological Exams: DG CHEST PORT 1 VIEW  Result Date: 12/09/2020 CLINICAL DATA:  Respiratory failure. EXAM: PORTABLE CHEST 1 VIEW COMPARISON:  12/04/2020 FINDINGS: ET tube tip is above the carina. The scratch set stable cardiomediastinal contours. Small left pleural effusion versus pleural scarring/thickening is unchanged. Decreased appearance of left base atelectasis. IMPRESSION: 1. Decrease in left base atelectasis. 2. Stable small left pleural effusion versus pleural scarring/thickening. Electronically Signed   By: Kerby Moors M.D.   On: 12/09/2020 12:15    Assessment/Plan Active Problems:   Adenocarcinoma of left lung, stage 1 (HCC)   Acute on chronic respiratory failure with hypoxia (HCC)   Atrial fibrillation (HCC)   Supraglottic stenosis   Acute on chronic respiratory failure with hypoxia back on the wean doing well plan is going to be to continue to expand on the weaning. Adenocarcinoma status postresection continue with supportive care Chronic atrial fibrillation rate controlled we will continue to monitor closely. Subglottic stenosis resolved as per evaluation   I have personally seen and evaluated the patient, evaluated laboratory and imaging results, formulated the assessment and plan and placed orders. The Patient requires high complexity decision making with multiple  systems involvement.  Rounds were done with the Respiratory Therapy Director and Staff therapists and discussed with nursing staff also.  Allyne Gee, MD [facility] Pulmonary Critical Care Medicine Sleep Medicine

## 2020-12-12 DIAGNOSIS — C3492 Malignant neoplasm of unspecified part of left bronchus or lung: Secondary | ICD-10-CM | POA: Diagnosis not present

## 2020-12-12 DIAGNOSIS — I480 Paroxysmal atrial fibrillation: Secondary | ICD-10-CM | POA: Diagnosis not present

## 2020-12-12 DIAGNOSIS — J9621 Acute and chronic respiratory failure with hypoxia: Secondary | ICD-10-CM | POA: Diagnosis not present

## 2020-12-12 DIAGNOSIS — J386 Stenosis of larynx: Secondary | ICD-10-CM | POA: Diagnosis not present

## 2020-12-12 NOTE — Progress Notes (Signed)
Pulmonary [place]  PROGRESS NOTE     CODEY BURLING  WLN:[id number]  DOB: 1940/10/16   DOA: 11/19/2020  Referring Physician: Merton Border, MD  HPI: RANDOL ZUMSTEIN is a 80 y.o. male being followed for ventilator/airway/oxygen weaning Acute on Chronic Respiratory Failure.  Patient is currently on T collar wean supposed to be doing 2 hours  Medications: Reviewed on Rounds  Physical Exam:  Vitals: Temperature is 96.1 pulse 78 respiratory 17 blood pressure is 111/62 saturations 100  Ventilator Settings on T collar with an FiO2 of 28%  General: Comfortable at this time Neck: supple Cardiovascular: no malignant arrhythmias Respiratory: Scattered rhonchi expansion is Skin: no rash seen on limited exam Musculoskeletal: No gross abnormality Psychiatric:unable to assess Neurologic:no involuntary movements         Lab Data:   Basic Metabolic Panel: Recent Labs  Lab 12/07/20 0308 12/09/20 0423  NA 132* 133*  K 4.6 4.1  CL 96* 93*  CO2 27 27  GLUCOSE 160* 154*  BUN 25* 31*  CREATININE 0.97 0.91  CALCIUM 9.3 9.4    ABG: No results for input(s): PHART, PCO2ART, PO2ART, HCO3, O2SAT in the last 168 hours.  Liver Function Tests: No results for input(s): AST, ALT, ALKPHOS, BILITOT, PROT, ALBUMIN in the last 168 hours. No results for input(s): LIPASE, AMYLASE in the last 168 hours. No results for input(s): AMMONIA in the last 168 hours.  CBC: Recent Labs  Lab 12/07/20 0308 12/09/20 0423  WBC 13.7* 12.0*  HGB 9.8* 9.5*  HCT 30.0* 29.5*  MCV 91.2 91.3  PLT 334 410*    Cardiac Enzymes: No results for input(s): CKTOTAL, CKMB, CKMBINDEX, TROPONINI in the last 168 hours.  BNP (last 3 results) Recent Labs    01/04/20 1411 07/03/20 1654 11/08/20 0026  BNP 125.3* 118.4* 269.8*    ProBNP (last 3 results) No results for input(s): PROBNP in the last 8760 hours.  Radiological  Exams: No results found.  Assessment/Plan Active Problems:   Adenocarcinoma of left lung, stage 1 (HCC)   Acute on chronic respiratory failure with hypoxia (HCC)   Atrial fibrillation (HCC)   Supraglottic stenosis   Acute on chronic respiratory failure with hypoxia plan is currently to continue to wean goal of 2 hours on T collar Adenocarcinoma of the lung no change we will continue with supportive care Atrial fibrillation rate is controlled at this time Supraglottic stenosis no change continue with supportive care   I have personally seen and evaluated the patient, evaluated laboratory and imaging results, formulated the assessment and plan and placed orders. The Patient requires high complexity decision making with multiple systems involvement.  Rounds were done with the Respiratory Therapy Director and Staff therapists and discussed with nursing staff also.  Allyne Gee, MD [facility] Pulmonary Critical Care Medicine Sleep Medicine

## 2020-12-13 DIAGNOSIS — I480 Paroxysmal atrial fibrillation: Secondary | ICD-10-CM | POA: Diagnosis not present

## 2020-12-13 DIAGNOSIS — C3492 Malignant neoplasm of unspecified part of left bronchus or lung: Secondary | ICD-10-CM | POA: Diagnosis not present

## 2020-12-13 DIAGNOSIS — J9621 Acute and chronic respiratory failure with hypoxia: Secondary | ICD-10-CM | POA: Diagnosis not present

## 2020-12-13 DIAGNOSIS — J386 Stenosis of larynx: Secondary | ICD-10-CM | POA: Diagnosis not present

## 2020-12-13 LAB — BASIC METABOLIC PANEL
Anion gap: 7 (ref 5–15)
BUN: 45 mg/dL — ABNORMAL HIGH (ref 8–23)
CO2: 33 mmol/L — ABNORMAL HIGH (ref 22–32)
Calcium: 9.5 mg/dL (ref 8.9–10.3)
Chloride: 92 mmol/L — ABNORMAL LOW (ref 98–111)
Creatinine, Ser: 0.94 mg/dL (ref 0.61–1.24)
GFR, Estimated: >60 mL/min (ref >60)
Glucose, Bld: 144 mg/dL — ABNORMAL HIGH (ref 70–99)
Potassium: 4.9 mmol/L (ref 3.5–5.1)
Sodium: 132 mmol/L — ABNORMAL LOW (ref 135–145)

## 2020-12-13 LAB — CBC
HCT: 33.3 % — ABNORMAL LOW (ref 39.0–52.0)
Hemoglobin: 10.8 g/dL — ABNORMAL LOW (ref 13.0–17.0)
MCH: 30.1 pg (ref 26.0–34.0)
MCHC: 32.4 g/dL (ref 30.0–36.0)
MCV: 92.8 fL (ref 80.0–100.0)
Platelets: 450 10*3/uL — ABNORMAL HIGH (ref 150–400)
RBC: 3.59 MIL/uL — ABNORMAL LOW (ref 4.22–5.81)
RDW: 15.1 % (ref 11.5–15.5)
WBC: 13.5 10*3/uL — ABNORMAL HIGH (ref 4.0–10.5)
nRBC: 0 % (ref 0.0–0.2)

## 2020-12-13 NOTE — Progress Notes (Signed)
[name]  PROGRESS NOTE     Devin [name]  XIH:[id number]  DOB: [birth year]   DOA: 12/07/2020  Referring Physician: Merton Border, MD  HPI: Devin [name] is a 80 y.o. male being followed for ventilator/airway/oxygen weaning Acute on Chronic Respiratory Failure.  Patient at this time is on T collar yesterday was able to complete 20 hours of T collar today we are going to advance to 24 hours of T collar.  Medications: Reviewed on Rounds  Physical Exam:  Vitals: Temperature 97.5 pulse 83 respiratory rate is 20 blood pressure is 113/70 saturations 100%  Ventilator Settings currently is on T collar weaning  General: Comfortable at this time Neck: supple Cardiovascular: no malignant arrhythmias Respiratory: No rhonchi no rales are noted at this time Skin: no rash seen on limited exam Musculoskeletal: No gross abnormality Psychiatric:unable to assess Neurologic:no involuntary movements         Lab Data:   Basic Metabolic Panel: Recent Labs  Lab 12/07/20 0308 12/09/20 0423 12/13/20 0434  NA 132* 133* 132*  K 4.6 4.1 4.9  CL 96* 93* 92*  CO2 27 27 33*  GLUCOSE 160* 154* 144*  BUN 25* 31* 45*  CREATININE 0.97 0.91 0.94  CALCIUM 9.3 9.4 9.5    ABG: No results for input(s): PHART, PCO2ART, PO2ART, HCO3, O2SAT in the last 168 hours.  Liver Function Tests: No results for input(s): AST, ALT, ALKPHOS, BILITOT, PROT, ALBUMIN in the last 168 hours. No results for input(s): LIPASE, AMYLASE in the last 168 hours. No results for input(s): AMMONIA in the last 168 hours.  CBC: Recent Labs  Lab 12/07/20 0308 12/09/20 0423 12/13/20 0434  WBC 13.7* 12.0* 13.5*  HGB 9.8* 9.5* 10.8*  HCT 30.0* 29.5* 33.3*  MCV 91.2 91.3 92.8  PLT 334 410* 450*    Cardiac Enzymes: No results for input(s): CKTOTAL, CKMB, CKMBINDEX, TROPONINI in the last 168 hours.  BNP (last 3 results) Recent  Labs    01/04/20 1411 07/03/20 1654 11/08/20 0026  BNP 125.3* 118.4* 269.8*    ProBNP (last 3 results) No results for input(s): PROBNP in the last 8760 hours.  Radiological Exams: No results found.  Assessment/Plan Active Problems:   Adenocarcinoma of left lung, stage 1 (HCC)   Acute on chronic respiratory failure with hypoxia (HCC)   Atrial fibrillation (HCC)   Supraglottic stenosis   Acute on chronic respiratory failure hypoxia plan is going to be to continue to advance the weaning patient is currently on T-piece goal will be 24 hours. 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 The Patient requires high complexity decision making with multiple systems involvement.  Rounds were done with the Respiratory Therapy Director and Staff therapists and discussed with nursing staff also.  Allyne Gee, MD [facility] Pulmonary Critical Care Medicine Sleep Medicine

## 2020-12-14 DIAGNOSIS — C3492 Malignant neoplasm of unspecified part of left bronchus or lung: Secondary | ICD-10-CM | POA: Diagnosis not present

## 2020-12-14 DIAGNOSIS — J9621 Acute and chronic respiratory failure with hypoxia: Secondary | ICD-10-CM | POA: Diagnosis not present

## 2020-12-14 DIAGNOSIS — I480 Paroxysmal atrial fibrillation: Secondary | ICD-10-CM | POA: Diagnosis not present

## 2020-12-14 DIAGNOSIS — J386 Stenosis of larynx: Secondary | ICD-10-CM | POA: Diagnosis not present

## 2020-12-14 LAB — URINALYSIS, ROUTINE W REFLEX MICROSCOPIC
Bilirubin Urine: NEGATIVE
Glucose, UA: NEGATIVE mg/dL
Hgb urine dipstick: NEGATIVE
Ketones, ur: NEGATIVE mg/dL
Leukocytes,Ua: NEGATIVE
Nitrite: NEGATIVE
Protein, ur: NEGATIVE mg/dL
Specific Gravity, Urine: 1.017 (ref 1.005–1.030)
pH: 5 (ref 5.0–8.0)

## 2020-12-14 LAB — BLOOD GAS, ARTERIAL
Acid-Base Excess: 5.4 mmol/L — ABNORMAL HIGH (ref 0.0–2.0)
Bicarbonate: 30.8 mmol/L — ABNORMAL HIGH (ref 20.0–28.0)
FIO2: 28
O2 Saturation: 94 %
Patient temperature: 36
pCO2 arterial: 55.5 mmHg — ABNORMAL HIGH (ref 32.0–48.0)
pH, Arterial: 7.357 (ref 7.350–7.450)
pO2, Arterial: 68.4 mmHg — ABNORMAL LOW (ref 83.0–108.0)

## 2020-12-14 NOTE — Progress Notes (Signed)
Pulmonary [place]  PROGRESS NOTE     Devin [name]  FTD:[id number]  DOB: 1940/08/13   DOA: 12/01/2020  Referring Physician: Merton Border, MD  HPI: Devin [name] is a 80 y.o. male being followed for ventilator/airway/oxygen weaning Acute on Chronic Respiratory Failure.  Patient is afebrile resting comfortably with no acute distress remains on T collar has been on 28% FiO2 with good saturations.  Medications: Reviewed on Rounds  Physical Exam:  Vitals: The temperature and is 96.5 pulse 77 respiratory rate 16 blood pressure is 138/67 saturations 99  Ventilator Settings patient currently is on T collar FiO2 of 28%  General: Comfortable at this time Neck: supple Cardiovascular: no malignant arrhythmias Respiratory: Scattered rhonchi expansion is equal Skin: no rash seen on limited exam Musculoskeletal: No gross abnormality Psychiatric:unable to assess Neurologic:no involuntary movements         Lab Data:   Basic Metabolic Panel: Recent Labs  Lab 12/09/20 0423 12/13/20 0434  NA 133* 132*  K 4.1 4.9  CL 93* 92*  CO2 27 33*  GLUCOSE 154* 144*  BUN 31* 45*  CREATININE 0.91 0.94  CALCIUM 9.4 9.5    ABG: Recent Labs  Lab 12/14/20 1010  PHART 7.357  PCO2ART 55.5*  PO2ART 68.4*  HCO3 30.8*  O2SAT 94.0    Liver Function Tests: No results for input(s): AST, ALT, ALKPHOS, BILITOT, PROT, ALBUMIN in the last 168 hours. No results for input(s): LIPASE, AMYLASE in the last 168 hours. No results for input(s): AMMONIA in the last 168 hours.  CBC: Recent Labs  Lab 12/09/20 0423 12/13/20 0434  WBC 12.0* 13.5*  HGB 9.5* 10.8*  HCT 29.5* 33.3*  MCV 91.3 92.8  PLT 410* 450*    Cardiac Enzymes: No results for input(s): CKTOTAL, CKMB, CKMBINDEX, TROPONINI in the last 168 hours.  BNP (last 3 results) Recent Labs    01/04/20 1411 07/03/20 1654 11/08/20 0026  BNP 125.3*  118.4* 269.8*    ProBNP (last 3 results) No results for input(s): PROBNP in the last 8760 hours.  Radiological Exams: No results found.  Assessment/Plan Active Problems:   Adenocarcinoma of left lung, stage 1 (HCC)   Acute on chronic respiratory failure with hypoxia (HCC)   Atrial fibrillation (HCC)   Supraglottic stenosis   Acute on chronic respiratory failure with hypoxia continue with the weaning patient completed 24 hours off the ventilator now will be going for 48 hours Adenocarcinoma supportive care we will continue to monitor closely. Atrial fibrillation rate is controlled at this time   I have personally seen and evaluated the patient, evaluated laboratory and imaging results, formulated the assessment and plan and placed orders. The Patient requires high complexity decision making with multiple systems involvement.  Rounds were done with the Respiratory Therapy Director and Staff therapists and discussed with nursing staff also.  Allyne Gee, MD [facility] Pulmonary Critical Care Medicine Sleep Medicine

## 2020-12-15 DIAGNOSIS — C3492 Malignant neoplasm of unspecified part of left bronchus or lung: Secondary | ICD-10-CM | POA: Diagnosis not present

## 2020-12-15 DIAGNOSIS — I480 Paroxysmal atrial fibrillation: Secondary | ICD-10-CM | POA: Diagnosis not present

## 2020-12-15 DIAGNOSIS — J9621 Acute and chronic respiratory failure with hypoxia: Secondary | ICD-10-CM | POA: Diagnosis not present

## 2020-12-16 ENCOUNTER — Other Ambulatory Visit (HOSPITAL_COMMUNITY): Payer: Self-pay

## 2020-12-16 LAB — BASIC METABOLIC PANEL
Anion gap: 5 (ref 5–15)
BUN: 51 mg/dL — ABNORMAL HIGH (ref 8–23)
CO2: 37 mmol/L — ABNORMAL HIGH (ref 22–32)
Calcium: 9.6 mg/dL (ref 8.9–10.3)
Chloride: 90 mmol/L — ABNORMAL LOW (ref 98–111)
Creatinine, Ser: 0.95 mg/dL (ref 0.61–1.24)
GFR, Estimated: >60 mL/min (ref >60)
Glucose, Bld: 143 mg/dL — ABNORMAL HIGH (ref 70–99)
Potassium: 5.6 mmol/L — ABNORMAL HIGH (ref 3.5–5.1)
Sodium: 132 mmol/L — ABNORMAL LOW (ref 135–145)

## 2020-12-16 LAB — CBC
HCT: 35.2 % — ABNORMAL LOW (ref 39.0–52.0)
Hemoglobin: 10.7 g/dL — ABNORMAL LOW (ref 13.0–17.0)
MCH: 29.4 pg (ref 26.0–34.0)
MCHC: 30.4 g/dL (ref 30.0–36.0)
MCV: 96.7 fL (ref 80.0–100.0)
Platelets: 397 10*3/uL (ref 150–400)
RBC: 3.64 MIL/uL — ABNORMAL LOW (ref 4.22–5.81)
RDW: 14.7 % (ref 11.5–15.5)
WBC: 18.8 10*3/uL — ABNORMAL HIGH (ref 4.0–10.5)
nRBC: 0 % (ref 0.0–0.2)

## 2020-12-16 LAB — URINE CULTURE: Culture: >=100000 — AB

## 2020-12-16 MED ORDER — IOHEXOL 300 MG/ML  SOLN
100.0000 mL | Freq: Once | INTRAMUSCULAR | Status: AC | PRN
  Administered 2020-12-16: 100 mL via INTRAVENOUS

## 2020-12-17 DIAGNOSIS — C3492 Malignant neoplasm of unspecified part of left bronchus or lung: Secondary | ICD-10-CM | POA: Diagnosis not present

## 2020-12-17 DIAGNOSIS — I480 Paroxysmal atrial fibrillation: Secondary | ICD-10-CM | POA: Diagnosis not present

## 2020-12-17 DIAGNOSIS — J9621 Acute and chronic respiratory failure with hypoxia: Secondary | ICD-10-CM | POA: Diagnosis not present

## 2020-12-17 LAB — POTASSIUM: Potassium: 5.1 mmol/L (ref 3.5–5.1)

## 2020-12-17 NOTE — Progress Notes (Signed)
Pulmonary [place]  PROGRESS NOTE     Devin [name]  [id number]:[id number]  DOB: February 17, 1941   DOA: 11/09/2020  Referring Physician: Merton Border, MD  HPI: Devin [name] is a 80 y.o. male being followed for ventilator/airway/oxygen weaning Acute on Chronic Respiratory Failure.  Patient has been on capping on 3 L of oxygen using the BiPAP at nighttime.  Has been tolerating it fairly well today will be the second fall date of capping  Medications: Reviewed on Rounds  Physical Exam:  Vitals: Temperature is 97.0 pulse 88 respiratory rate is 13 blood pressure 152/78 saturations 100%  Ventilator Settings capping off the vent  General: Comfortable at this time Neck: supple Cardiovascular: no malignant arrhythmias Respiratory: No rhonchi very coarse breath sounds Skin: no rash seen on limited exam Musculoskeletal: No gross abnormality Psychiatric:unable to assess Neurologic:no involuntary movements         Lab Data:   Basic Metabolic Panel: Recent Labs  Lab 12/13/20 0434 12/16/20 0207 12/17/20 0747  NA 132* 132*  --   K 4.9 5.6* 5.1  CL 92* 90*  --   CO2 33* 37*  --   GLUCOSE 144* 143*  --   BUN 45* 51*  --   CREATININE 0.94 0.95  --   CALCIUM 9.5 9.6  --     ABG: Recent Labs  Lab 12/14/20 1010  PHART 7.357  PCO2ART 55.5*  PO2ART 68.4*  HCO3 30.8*  O2SAT 94.0    Liver Function Tests: No results for input(s): AST, ALT, ALKPHOS, BILITOT, PROT, ALBUMIN in the last 168 hours. No results for input(s): LIPASE, AMYLASE in the last 168 hours. No results for input(s): AMMONIA in the last 168 hours.  CBC: Recent Labs  Lab 12/13/20 0434 12/16/20 0207  WBC 13.5* 18.8*  HGB 10.8* 10.7*  HCT 33.3* 35.2*  MCV 92.8 96.7  PLT 450* 397    Cardiac Enzymes: No results for input(s): CKTOTAL, CKMB, CKMBINDEX, TROPONINI in the last 168 hours.  BNP (last 3 results) Recent Labs     01/04/20 1411 07/03/20 1654 11/08/20 0026  BNP 125.3* 118.4* 269.8*    ProBNP (last 3 results) No results for input(s): PROBNP in the last 8760 hours.  Radiological Exams: CT ABDOMEN PELVIS W CONTRAST  Result Date: 12/16/2020 CLINICAL DATA:  Elevated white blood cell count. Possible pyelonephritis. Prior gastrostomy. Prostatectomy with pelvic lymph node dissection in 2012. History of adenocarcinoma of left lung. Thyroid cancer. EXAM: CT ABDOMEN AND PELVIS WITH CONTRAST TECHNIQUE: Multidetector CT imaging of the abdomen and pelvis was performed using the standard protocol following bolus administration of intravenous contrast. CONTRAST:  160m OMNIPAQUE IOHEXOL 300 MG/ML  SOLN COMPARISON:  10/24/2020 FINDINGS: Lower chest: Left base dependent atelectasis. Right lower lobe consolidation. Normal heart size. Small left pleural effusion with pleural thickening indicative of chronicity, similar to 10/24/2020. Trace right pleural fluid is also not significantly changed. Hepatobiliary: Mild degradation secondary to overlying wires and leads, as well as patient arm position, not raised above the head. Normal liver. Cholecystectomy, without biliary ductal dilatation. Pancreas: Normal, without mass or ductal dilatation. Spleen: Normal in size, without focal abnormality. Adrenals/Urinary Tract: Normal adrenal glands. 3 mm right renal collecting system calculus. Normal left kidney, without hydronephrosis. Normal urinary bladder. Stomach/Bowel: Gastrostomy tube appropriately positioned. Descending duodenal diverticulum, with otherwise normal small bowel. Surgical changes in the ascending colon. Vascular/Lymphatic: Aortic atherosclerosis. No abdominopelvic adenopathy. Reproductive: Prostatectomy and pelvic node dissection. Other:  No significant free fluid. No free intraperitoneal air. Ventral abdominal wall hernia repair. Musculoskeletal: Thoracolumbar spine fixation. L1, T12 and T9 compression deformities are similar.  IMPRESSION: 1. No acute process in the abdomen or pelvis. 2. Right lung base consolidation is most consistent with pneumonia. No other explanation for elevated white blood cell count. 3. Right nephrolithiasis. 4. Chronic small left and trace right pleural effusions. 5. Aortic Atherosclerosis (ICD10-I70.0). Electronically Signed   By: Abigail Miyamoto M.D.   On: 12/16/2020 14:30    Assessment/Plan Active Problems:   Adenocarcinoma of left lung, stage 1 (HCC)   Acute on chronic respiratory failure with hypoxia (HCC)   Atrial fibrillation (HCC)   Supraglottic stenosis   Acute on chronic respiratory failure hypoxia doing fine with capping trials last ABG was on the seventh which actually looked good.  Appeared to be compensated we will continue with capping and reassess for the possibility of decannulation.  If he looks good may consider it also however he needs to be compliant with the BiPAP.  If he is compliant with BiPAP ABGs look good then would consider the possibility of decannulation Adenocarcinoma status postresection Atrial fibrillation rate is controlled we will continue to monitor closely. Pneumonia last chest x-ray showed some basilar consolidation will discuss with primary team consider follow-up x-ray   I have personally seen and evaluated the patient, evaluated laboratory and imaging results, formulated the assessment and plan and placed orders. The Patient requires high complexity decision making with multiple systems involvement.  Rounds were done with the Respiratory Therapy Director and Staff therapists and discussed with nursing staff also.  Allyne Gee, MD [facility] Pulmonary Critical Care Medicine Sleep Medicine

## 2020-12-17 NOTE — Progress Notes (Signed)
Pulmonary [place]  PROGRESS NOTE     KWELI [name]  STM:[id number]  DOB: Apr 26, 1941   DOA: 11/10/2020  Referring Physician: Merton Border, MD  HPI: Devin [name] is a 80 y.o. male being followed for ventilator/airway/oxygen weaning Acute on Chronic Respiratory Failure.  Patient is currently In respiratory distress at this time.  Medications: Reviewed on Rounds  Physical Exam:  Vitals: Temperature 97.0 pulse 93 respiratory is 20 blood pressure is 150/66 saturations 100%  Ventilator Settings capping off the ventilator using the BiPAP at night  General: Comfortable at this time Neck: supple Cardiovascular: no malignant arrhythmias Respiratory: No rhonchi very coarse breath sounds Skin: no rash seen on limited exam Musculoskeletal: No gross abnormality Psychiatric:unable to assess Neurologic:no involuntary movements         Lab Data:   Basic Metabolic Panel: Recent Labs  Lab 12/13/20 0434 12/16/20 0207 12/17/20 0747  NA 132* 132*  --   K 4.9 5.6* 5.1  CL 92* 90*  --   CO2 33* 37*  --   GLUCOSE 144* 143*  --   BUN 45* 51*  --   CREATININE 0.94 0.95  --   CALCIUM 9.5 9.6  --     ABG: Recent Labs  Lab 12/14/20 1010  PHART 7.357  PCO2ART 55.5*  PO2ART 68.4*  HCO3 30.8*  O2SAT 94.0    Liver Function Tests: No results for input(s): AST, ALT, ALKPHOS, BILITOT, PROT, ALBUMIN in the last 168 hours. No results for input(s): LIPASE, AMYLASE in the last 168 hours. No results for input(s): AMMONIA in the last 168 hours.  CBC: Recent Labs  Lab 12/13/20 0434 12/16/20 0207  WBC 13.5* 18.8*  HGB 10.8* 10.7*  HCT 33.3* 35.2*  MCV 92.8 96.7  PLT 450* 397    Cardiac Enzymes: No results for input(s): CKTOTAL, CKMB, CKMBINDEX, TROPONINI in the last 168 hours.  BNP (last 3 results) Recent Labs    01/04/20 1411 07/03/20 1654 11/08/20 0026  BNP 125.3* 118.4* 269.8*     ProBNP (last 3 results) No results for input(s): PROBNP in the last 8760 hours.  Radiological Exams:   Assessment/Plan Active Problems:   Adenocarcinoma of left lung, stage 1 (HCC)   Acute on chronic respiratory failure with hypoxia (HCC)   Atrial fibrillation (HCC)   Supraglottic stenosis   Acute on chronic respiratory failure with hypoxia plan is going to be continue with the capping trials.  We will titrate oxygen and continue secretion management pulmonary toilet. Adenocarcinoma status postresection continue to monitor Chronic atrial fibrillation rate controlled we will continue with supportive care   I have personally seen and evaluated the patient, evaluated laboratory and imaging results, formulated the assessment and plan and placed orders. The Patient requires high complexity decision making with multiple systems involvement.  Rounds were done with the Respiratory Therapy Director and Staff therapists and discussed with nursing staff also.  Allyne Gee, MD [facility] Pulmonary Critical Care Medicine Sleep Medicine

## 2020-12-18 ENCOUNTER — Other Ambulatory Visit (HOSPITAL_COMMUNITY): Payer: Self-pay

## 2020-12-18 DIAGNOSIS — I480 Paroxysmal atrial fibrillation: Secondary | ICD-10-CM | POA: Diagnosis not present

## 2020-12-18 DIAGNOSIS — J9621 Acute and chronic respiratory failure with hypoxia: Secondary | ICD-10-CM | POA: Diagnosis not present

## 2020-12-18 DIAGNOSIS — C3492 Malignant neoplasm of unspecified part of left bronchus or lung: Secondary | ICD-10-CM | POA: Diagnosis not present

## 2020-12-18 LAB — BLOOD GAS, ARTERIAL
Acid-Base Excess: 11.3 mmol/L — ABNORMAL HIGH (ref 0.0–2.0)
Acid-Base Excess: 12 mmol/L — ABNORMAL HIGH (ref 0.0–2.0)
Bicarbonate: 39.5 mmol/L — ABNORMAL HIGH (ref 20.0–28.0)
Bicarbonate: 37.8 mmol/L — ABNORMAL HIGH (ref 20.0–28.0)
FIO2: 28
FIO2: 28
O2 Saturation: 97.4 %
O2 Saturation: 95.2 %
Patient temperature: 37
Patient temperature: 37
pCO2 arterial: 107 mmHg (ref 32.0–48.0)
pCO2 arterial: 68.2 mmHg (ref 32.0–48.0)
pH, Arterial: 7.193 — CL (ref 7.350–7.450)
pH, Arterial: 7.362 (ref 7.350–7.450)
pO2, Arterial: 104 mmHg (ref 83.0–108.0)
pO2, Arterial: 69.6 mmHg — ABNORMAL LOW (ref 83.0–108.0)

## 2020-12-18 LAB — BASIC METABOLIC PANEL
Anion gap: 6 (ref 5–15)
BUN: 43 mg/dL — ABNORMAL HIGH (ref 8–23)
CO2: 39 mmol/L — ABNORMAL HIGH (ref 22–32)
Calcium: 9.7 mg/dL (ref 8.9–10.3)
Chloride: 91 mmol/L — ABNORMAL LOW (ref 98–111)
Creatinine, Ser: 0.79 mg/dL (ref 0.61–1.24)
GFR, Estimated: >60 mL/min (ref >60)
Glucose, Bld: 215 mg/dL — ABNORMAL HIGH (ref 70–99)
Potassium: 4.5 mmol/L (ref 3.5–5.1)
Sodium: 136 mmol/L (ref 135–145)

## 2020-12-18 LAB — CBC
HCT: 38.8 % — ABNORMAL LOW (ref 39.0–52.0)
Hemoglobin: 11.1 g/dL — ABNORMAL LOW (ref 13.0–17.0)
MCH: 28.9 pg (ref 26.0–34.0)
MCHC: 28.6 g/dL — ABNORMAL LOW (ref 30.0–36.0)
MCV: 101 fL — ABNORMAL HIGH (ref 80.0–100.0)
Platelets: 355 10*3/uL (ref 150–400)
RBC: 3.84 MIL/uL — ABNORMAL LOW (ref 4.22–5.81)
RDW: 14.8 % (ref 11.5–15.5)
WBC: 22.1 10*3/uL — ABNORMAL HIGH (ref 4.0–10.5)
nRBC: 0 % (ref 0.0–0.2)

## 2020-12-18 NOTE — Progress Notes (Signed)
Pulmonary [place]  PROGRESS NOTE     Devin [name]  UTM:[id number]  DOB: [birth year]   DOA: 11/26/2020  Referring Physician: Merton Border, MD  HPI: Devin [name] is a 80 y.o. male being followed for ventilator/airway/oxygen weaning Acute on Chronic Respiratory Failure.  This morning during rounds patient was noted to be quite somnolent.  He had been on T collar.  Reportedly has been using the BiPAP at nighttime.  I asked for respiratory therapy to get an blood gas done to make certain he was not retaining CO2 which was indeed the case  Medications: Reviewed on Rounds  Physical Exam:  Vitals: Temperature was 97.2 pulse 97 respiratory rate 18 blood pressure was 135/61 saturations 100%  Ventilator Settings patient was off the ventilator on T collar at the time of evaluation  General: Comfortable at this time Neck: supple Cardiovascular: no malignant arrhythmias Respiratory: Rhonchi expansion equal Skin: no rash seen on limited exam Musculoskeletal: No gross abnormality Psychiatric:unable to assess Neurologic:no involuntary movements         Lab Data:   Basic Metabolic Panel: Recent Labs  Lab 12/13/20 0434 12/16/20 0207 12/17/20 0747 12/18/20 1036  NA 132* 132*  --  136  K 4.9 5.6* 5.1 4.5  CL 92* 90*  --  91*  CO2 33* 37*  --  39*  GLUCOSE 144* 143*  --  215*  BUN 45* 51*  --  43*  CREATININE 0.94 0.95  --  0.79  CALCIUM 9.5 9.6  --  9.7    ABG: Recent Labs  Lab 12/14/20 1010 12/18/20 0915 12/18/20 1210  PHART 7.357 7.193* 7.362  PCO2ART 55.5* 107* 68.2*  PO2ART 68.4* 104 69.6*  HCO3 30.8* 39.5* 37.8*  O2SAT 94.0 97.4 95.2    Liver Function Tests: No results for input(s): AST, ALT, ALKPHOS, BILITOT, PROT, ALBUMIN in the last 168 hours. No results for input(s): LIPASE, AMYLASE in the last 168 hours. No results for input(s): AMMONIA in the last 168  hours.  CBC: Recent Labs  Lab 12/13/20 0434 12/16/20 0207 12/18/20 1036  WBC 13.5* 18.8* 22.1*  HGB 10.8* 10.7* 11.1*  HCT 33.3* 35.2* 38.8*  MCV 92.8 96.7 101.0*  PLT 450* 397 355    Cardiac Enzymes: No results for input(s): CKTOTAL, CKMB, CKMBINDEX, TROPONINI in the last 168 hours.  BNP (last 3 results) Recent Labs    01/04/20 1411 07/03/20 1654 11/08/20 0026  BNP 125.3* 118.4* 269.8*    ProBNP (last 3 results) No results for input(s): PROBNP in the last 8760 hours.  Radiological Exams: DG CHEST PORT 1 VIEW  Result Date: 12/18/2020 CLINICAL DATA:  Respiratory failure EXAM: PORTABLE CHEST 1 VIEW COMPARISON:  Nine days ago FINDINGS: Tracheostomy tube in place. Extensive spinal fusion hardware spanning the thoracic and lumbar spine. Hazy opacity at the bases that correlates with atelectasis and pleural fluid, worsened. Normal heart size. IMPRESSION: Chronic atelectasis and pleural fluid. Aeration has worsened since 12/09/2009. Electronically Signed   By: Monte Fantasia M.D.   On: 12/18/2020 11:47    Assessment/Plan Active Problems:   Adenocarcinoma of left lung, stage 1 (HCC)   Acute on chronic respiratory failure with hypoxia (HCC)   Atrial fibrillation (HCC)   Supraglottic stenosis   Acute on chronic respiratory failure with hypoxia patient noted to have change in mental status appear to be more somnolent suggested doing ABG which showed a pH of 7.1 so therefore I  asked for respiratory therapy to take the capping and discontinue it right now and possibly may have to place him back on the ventilator. Adenocarcinoma of the lung supportive care prognosis remains quite guarded Atrial fibrillation rate is controlled at this time Hypersomnolence CO2 retention noted on the blood gas as discussed.  Patient was uncapped follow-up ABG was ordered which was noted above that showed an improvement in pH of 7.36 PCO2 of 68.2 PO2 of 69 for now we will maintain   I have personally  seen and evaluated the patient, evaluated laboratory and imaging results, formulated the assessment and plan and placed orders. The Patient requires high complexity decision making with multiple systems involvement.  Rounds were done with the Respiratory Therapy Director and Staff therapists and discussed with nursing staff also.  Allyne Gee, MD [facility] Pulmonary Critical Care Medicine Sleep Medicine

## 2020-12-19 DIAGNOSIS — C3492 Malignant neoplasm of unspecified part of left bronchus or lung: Secondary | ICD-10-CM | POA: Diagnosis not present

## 2020-12-19 DIAGNOSIS — I480 Paroxysmal atrial fibrillation: Secondary | ICD-10-CM | POA: Diagnosis not present

## 2020-12-19 DIAGNOSIS — J9621 Acute and chronic respiratory failure with hypoxia: Secondary | ICD-10-CM | POA: Diagnosis not present

## 2020-12-19 LAB — BASIC METABOLIC PANEL
Anion gap: 4 — ABNORMAL LOW (ref 5–15)
BUN: 38 mg/dL — ABNORMAL HIGH (ref 8–23)
CO2: 41 mmol/L — ABNORMAL HIGH (ref 22–32)
Calcium: 9.6 mg/dL (ref 8.9–10.3)
Chloride: 90 mmol/L — ABNORMAL LOW (ref 98–111)
Creatinine, Ser: 0.69 mg/dL (ref 0.61–1.24)
GFR, Estimated: >60 mL/min (ref >60)
Glucose, Bld: 143 mg/dL — ABNORMAL HIGH (ref 70–99)
Potassium: 4.6 mmol/L (ref 3.5–5.1)
Sodium: 135 mmol/L (ref 135–145)

## 2020-12-19 NOTE — Progress Notes (Signed)
Pulmonary [place]  PROGRESS NOTE     Devin [name]  BOF:[id number]  DOB: 1940/12/07   DOA: 11/30/2020  Referring Physician: Merton Border, MD  HPI: Devin [name] is a 80 y.o. male being followed for ventilator/airway/oxygen weaning Acute on Chronic Respiratory Failure.  Patient right now is on T collar has been uncomfortable without distress at this time after we did the uncapping yesterday is PCO2 is actually improved time did not have to go back on the ventilator  Medications: Reviewed on Rounds  Physical Exam:  Vitals: Temperature is 97.8 pulse 110 respiratory 27 blood pressure is 119/58 saturations 99%  Ventilator Settings on T collar currently on 28% FiO2  General: Comfortable at this time Neck: supple Cardiovascular: no malignant arrhythmias Respiratory: Scattered rhonchi expansion is equal Skin: no rash seen on limited exam Musculoskeletal: No gross abnormality Psychiatric:unable to assess Neurologic:no involuntary movements         Lab Data:   Basic Metabolic Panel: Recent Labs  Lab 12/13/20 0434 12/16/20 0207 12/17/20 0747 12/18/20 1036 12/19/20 0419  NA 132* 132*  --  136 135  K 4.9 5.6* 5.1 4.5 4.6  CL 92* 90*  --  91* 90*  CO2 33* 37*  --  39* 41*  GLUCOSE 144* 143*  --  215* 143*  BUN 45* 51*  --  43* 38*  CREATININE 0.94 0.95  --  0.79 0.69  CALCIUM 9.5 9.6  --  9.7 9.6    ABG: Recent Labs  Lab 12/14/20 1010 12/18/20 0915 12/18/20 1210  PHART 7.357 7.193* 7.362  PCO2ART 55.5* 107* 68.2*  PO2ART 68.4* 104 69.6*  HCO3 30.8* 39.5* 37.8*  O2SAT 94.0 97.4 95.2    Liver Function Tests: No results for input(s): AST, ALT, ALKPHOS, BILITOT, PROT, ALBUMIN in the last 168 hours. No results for input(s): LIPASE, AMYLASE in the last 168 hours. No results for input(s): AMMONIA in the last 168 hours.  CBC: Recent Labs  Lab 12/13/20 0434 12/16/20 0207  12/18/20 1036  WBC 13.5* 18.8* 22.1*  HGB 10.8* 10.7* 11.1*  HCT 33.3* 35.2* 38.8*  MCV 92.8 96.7 101.0*  PLT 450* 397 355    Cardiac Enzymes: No results for input(s): CKTOTAL, CKMB, CKMBINDEX, TROPONINI in the last 168 hours.  BNP (last 3 results) Recent Labs    01/04/20 1411 07/03/20 1654 11/08/20 0026  BNP 125.3* 118.4* 269.8*    ProBNP (last 3 results) No results for input(s): PROBNP in the last 8760 hours.  Radiological Exams: DG CHEST PORT 1 VIEW  Result Date: 12/18/2020 CLINICAL DATA:  Respiratory failure EXAM: PORTABLE CHEST 1 VIEW COMPARISON:  Nine days ago FINDINGS: Tracheostomy tube in place. Extensive spinal fusion hardware spanning the thoracic and lumbar spine. Hazy opacity at the bases that correlates with atelectasis and pleural fluid, worsened. Normal heart size. IMPRESSION: Chronic atelectasis and pleural fluid. Aeration has worsened since 12/09/2009. Electronically Signed   By: Monte Fantasia M.D.   On: 12/18/2020 11:47    Assessment/Plan Active Problems:   Adenocarcinoma of left lung, stage 1 (HCC)   Acute on chronic respiratory failure with hypoxia (HCC)   Atrial fibrillation (HCC)   Supraglottic stenosis   Acute on chronic respiratory failure with hypoxia we will continue with T collar we can use PMV to allow him to speak but I think that he probably should be held off on decannulation for now Adenocarcinoma of the lung status post resection  supportive care Chronic atrial fibrillation rate is controlled Sleep apnea patient will have the tracheostomy left in place   I have personally seen and evaluated the patient, evaluated laboratory and imaging results, formulated the assessment and plan and placed orders. The Patient requires high complexity decision making with multiple systems involvement.  Rounds were done with the Respiratory Therapy Director and Staff therapists and discussed with nursing staff also.  Allyne Gee, MD [facility] Pulmonary  Critical Care Medicine Sleep Medicine

## 2020-12-20 DIAGNOSIS — I480 Paroxysmal atrial fibrillation: Secondary | ICD-10-CM | POA: Diagnosis not present

## 2020-12-20 DIAGNOSIS — J9621 Acute and chronic respiratory failure with hypoxia: Secondary | ICD-10-CM | POA: Diagnosis not present

## 2020-12-20 DIAGNOSIS — C3492 Malignant neoplasm of unspecified part of left bronchus or lung: Secondary | ICD-10-CM | POA: Diagnosis not present

## 2020-12-20 NOTE — Progress Notes (Signed)
Pulmonary [name]  PROGRESS NOTE     Devin [name]  [id number]:[id number]  DOB: February 02, 1941   DOA: 12/04/2020  Referring Physician: Merton Border, MD  HPI: Devin [name] is a 80 y.o. male being followed for ventilator/airway/oxygen weaning Acute on Chronic Respiratory Failure.  Initially Reitnauer is on T collar on 28% FiO2 good saturations are noted.  Patient's daughter was present in the room.  I had a very lengthy conversation with the respiratory manager also present in the room.  At this time the patient is a DNR and the patient does not want to be placed on a ventilator.  I made it clear that if failure were events that occurred where his PCO2 went up quite earlier this week would he want to be placed on the ventilator and his response was no.  He understands that this could potentially lead to cardiac arrest and death.  In addition the family would like to have home training done and the patient would like to have home training done and the patient would like to like discharged home  Medications: Reviewed on Rounds  Physical Exam:  Vitals: Temperature is 97.4 pulse 83 respiratory 18 blood pressure is 133/77 saturations 100%  Ventilator Settings on T collar FiO2 of 28%  General: Comfortable at this time Neck: supple Cardiovascular: no malignant arrhythmias Respiratory: No rhonchi very coarse breath sounds Skin: no rash seen on limited exam Musculoskeletal: No gross abnormality Psychiatric:unable to assess Neurologic:no involuntary movements         Lab Data:   Basic Metabolic Panel: Recent Labs  Lab 12/16/20 0207 12/17/20 0747 12/18/20 1036 12/19/20 0419  NA 132*  --  136 135  K 5.6* 5.1 4.5 4.6  CL 90*  --  91* 90*  CO2 37*  --  39* 41*  GLUCOSE 143*  --  215* 143*  BUN 51*  --  43* 38*  CREATININE 0.95  --  0.79 0.69  CALCIUM 9.6  --  9.7 9.6    ABG: Recent Labs  Lab  12/14/20 1010 12/18/20 0915 12/18/20 1210  PHART 7.357 7.193* 7.362  PCO2ART 55.5* 107* 68.2*  PO2ART 68.4* 104 69.6*  HCO3 30.8* 39.5* 37.8*  O2SAT 94.0 97.4 95.2    Liver Function Tests: No results for input(s): AST, ALT, ALKPHOS, BILITOT, PROT, ALBUMIN in the last 168 hours. No results for input(s): LIPASE, AMYLASE in the last 168 hours. No results for input(s): AMMONIA in the last 168 hours.  CBC: Recent Labs  Lab 12/16/20 0207 12/18/20 1036  WBC 18.8* 22.1*  HGB 10.7* 11.1*  HCT 35.2* 38.8*  MCV 96.7 101.0*  PLT 397 355    Cardiac Enzymes: No results for input(s): CKTOTAL, CKMB, CKMBINDEX, TROPONINI in the last 168 hours.  BNP (last 3 results) Recent Labs    01/04/20 1411 07/03/20 1654 11/08/20 0026  BNP 125.3* 118.4* 269.8*    ProBNP (last 3 results) No results for input(s): PROBNP in the last 8760 hours.  Radiological Exams: No results found.  Assessment/Plan Active Problems:   Adenocarcinoma of left lung, stage 1 (HCC)   Acute on chronic respiratory failure with hypoxia (HCC)   Atrial fibrillation (HCC)   Supraglottic stenosis   Acute on chronic respiratory failure with hypoxia at this time patient is going to be continued on the T collar.  He can use the PMV as tolerated.  In addition he does not want to use the  BiPAP.  In addition he does not want to be placed back on the ventilator.  The goal at this time will be to do home training and get the patient discharged home family is also in agreement at bedside Adenocarcinoma of the lung no change we will continue to monitor along closely. Chronic atrial fibrillation rate is controlled at this time. Sleep apnea the patient will be continued on trach collar   I have personally seen and evaluated the patient, evaluated laboratory and imaging results, formulated the assessment and plan and placed orders. The Patient requires high complexity decision making with multiple systems involvement.  Rounds  were done with the Respiratory Therapy Director and Staff therapists and discussed with nursing staff also.  Allyne Gee, MD [facility] Pulmonary Critical Care Medicine Sleep Medicine

## 2020-12-21 ENCOUNTER — Other Ambulatory Visit (HOSPITAL_COMMUNITY): Payer: Self-pay

## 2020-12-21 DIAGNOSIS — J9621 Acute and chronic respiratory failure with hypoxia: Secondary | ICD-10-CM | POA: Diagnosis not present

## 2020-12-21 DIAGNOSIS — I480 Paroxysmal atrial fibrillation: Secondary | ICD-10-CM | POA: Diagnosis not present

## 2020-12-21 DIAGNOSIS — C3492 Malignant neoplasm of unspecified part of left bronchus or lung: Secondary | ICD-10-CM | POA: Diagnosis not present

## 2020-12-21 LAB — BLOOD GAS, ARTERIAL
Acid-Base Excess: 13.4 mmol/L — ABNORMAL HIGH (ref 0.0–2.0)
Bicarbonate: 38.7 mmol/L — ABNORMAL HIGH (ref 20.0–28.0)
FIO2: 40
O2 Saturation: 92.1 %
Patient temperature: 36.4
pCO2 arterial: 61.5 mmHg — ABNORMAL HIGH (ref 32.0–48.0)
pH, Arterial: 7.413 (ref 7.350–7.450)
pO2, Arterial: 61.9 mmHg — ABNORMAL LOW (ref 83.0–108.0)

## 2020-12-21 NOTE — Progress Notes (Signed)
Pulmonary [place]  PROGRESS NOTE     Devin [name]  JOA:[id number]  DOB: 03/11/1941   DOA: 11/20/2020  Referring Physician: Merton Border, MD  HPI: Devin [name] is a 80 y.o. male being followed for ventilator/airway/oxygen weaning Acute on Chronic Respiratory Failure.  This morning patient was afebrile at significant hypoxia noted.  Ordered a chest x-ray results are pending at this time.  Patient also ABG ordered.  Secretions appear to be quite copious and spoke with respiratory therapy due to back and lavage.  Medications: Reviewed on Rounds  Physical Exam:  Vitals: Temperature is 97.2 pulse 90 respiratory rate is 20 blood pressure is 139/59 saturations 95%  Ventilator Settings currently on T collar with an FiO2 of 28%  General: Comfortable at this time Neck: supple Cardiovascular: no malignant arrhythmias Respiratory: Coarse rhonchi are noted bilaterally Skin: no rash seen on limited exam Musculoskeletal: No gross abnormality Psychiatric:unable to assess Neurologic:no involuntary movements         Lab Data:   Basic Metabolic Panel: Recent Labs  Lab 12/16/20 0207 12/17/20 0747 12/18/20 1036 12/19/20 0419  NA 132*  --  136 135  K 5.6* 5.1 4.5 4.6  CL 90*  --  91* 90*  CO2 37*  --  39* 41*  GLUCOSE 143*  --  215* 143*  BUN 51*  --  43* 38*  CREATININE 0.95  --  0.79 0.69  CALCIUM 9.6  --  9.7 9.6    ABG: Recent Labs  Lab 12/14/20 1010 12/18/20 0915 12/18/20 1210  PHART 7.357 7.193* 7.362  PCO2ART 55.5* 107* 68.2*  PO2ART 68.4* 104 69.6*  HCO3 30.8* 39.5* 37.8*  O2SAT 94.0 97.4 95.2    Liver Function Tests: No results for input(s): AST, ALT, ALKPHOS, BILITOT, PROT, ALBUMIN in the last 168 hours. No results for input(s): LIPASE, AMYLASE in the last 168 hours. No results for input(s): AMMONIA in the last 168 hours.  CBC: Recent Labs  Lab 12/16/20 0207  12/18/20 1036  WBC 18.8* 22.1*  HGB 10.7* 11.1*  HCT 35.2* 38.8*  MCV 96.7 101.0*  PLT 397 355    Cardiac Enzymes: No results for input(s): CKTOTAL, CKMB, CKMBINDEX, TROPONINI in the last 168 hours.  BNP (last 3 results) Recent Labs    01/04/20 1411 07/03/20 1654 11/08/20 0026  BNP 125.3* 118.4* 269.8*    ProBNP (last 3 results) No results for input(s): PROBNP in the last 8760 hours.  Radiological Exams: No results found.  Assessment/Plan Active Problems:   Adenocarcinoma of left lung, stage 1 (HCC)   Acute on chronic respiratory failure with hypoxia (HCC)   Atrial fibrillation (HCC)   Supraglottic stenosis   Acute on chronic respiratory failure with hypoxia Reitnauer is on T collar his oxygen requirements have gone up significantly however.  The patient as discussed yesterday he is DNR and not to be placed on mechanical ventilation.  Going to try to bag of oxygen and see if this helps to bring his saturations up.  In addition chest x-ray results will be followed up Adenocarcinoma treated we will continue with supportive care Atrial fibrillation rate now rate is controlled we will continue with present management Sleep apnea nonissue at this time Acute hypoxia likely increased secretions we will have respiratory therapy perform aggressive pulmonary toileting bag and lavage and as already noted x-ray ordered ABG ordered   I have personally seen and evaluated the patient, evaluated laboratory  and imaging results, formulated the assessment and plan and placed orders. The Patient requires high complexity decision making with multiple systems involvement.  Rounds were done with the Respiratory Therapy Director and Staff therapists and discussed with nursing staff also.  Allyne Gee, MD [facility] Pulmonary Critical Care Medicine Sleep Medicine

## 2020-12-22 DIAGNOSIS — I480 Paroxysmal atrial fibrillation: Secondary | ICD-10-CM | POA: Diagnosis not present

## 2020-12-22 DIAGNOSIS — C3492 Malignant neoplasm of unspecified part of left bronchus or lung: Secondary | ICD-10-CM | POA: Diagnosis not present

## 2020-12-22 DIAGNOSIS — J9621 Acute and chronic respiratory failure with hypoxia: Secondary | ICD-10-CM | POA: Diagnosis not present

## 2020-12-22 LAB — BASIC METABOLIC PANEL
Anion gap: 7 (ref 5–15)
BUN: 38 mg/dL — ABNORMAL HIGH (ref 8–23)
CO2: 38 mmol/L — ABNORMAL HIGH (ref 22–32)
Calcium: 9.9 mg/dL (ref 8.9–10.3)
Chloride: 86 mmol/L — ABNORMAL LOW (ref 98–111)
Creatinine, Ser: 0.77 mg/dL (ref 0.61–1.24)
GFR, Estimated: >60 mL/min (ref >60)
Glucose, Bld: 185 mg/dL — ABNORMAL HIGH (ref 70–99)
Potassium: 4.8 mmol/L (ref 3.5–5.1)
Sodium: 131 mmol/L — ABNORMAL LOW (ref 135–145)

## 2020-12-22 LAB — CBC
HCT: 35.2 % — ABNORMAL LOW (ref 39.0–52.0)
Hemoglobin: 11.1 g/dL — ABNORMAL LOW (ref 13.0–17.0)
MCH: 29.3 pg (ref 26.0–34.0)
MCHC: 31.5 g/dL (ref 30.0–36.0)
MCV: 92.9 fL (ref 80.0–100.0)
Platelets: 322 10*3/uL (ref 150–400)
RBC: 3.79 MIL/uL — ABNORMAL LOW (ref 4.22–5.81)
RDW: 15 % (ref 11.5–15.5)
WBC: 14.2 10*3/uL — ABNORMAL HIGH (ref 4.0–10.5)
nRBC: 0 % (ref 0.0–0.2)

## 2020-12-22 NOTE — Progress Notes (Signed)
Pulmonary [place]  PROGRESS NOTE     KJ IMBERT  [id number]:[id number]  DOB: [birth year]   DOA: 11/14/2020  Referring Physician: Merton Border, MD  HPI: Devin [name] is a 80 y.o. male being followed for ventilator/airway/oxygen weaning Acute on Chronic Respiratory Failure.  Patient is afebrile right now comfortable without any distress at this time.  Has been on T collar requiring 40% FiO2.  Medications: Reviewed on Rounds  Physical Exam:  Vitals: Temperature is 98.4 pulse 90 respiratory 25 blood pressure is 140/69 saturations 93%  Ventilator Settings on T collar FiO2 down to 40%  General: Comfortable at this time Neck: supple Cardiovascular: no malignant arrhythmias Respiratory: No rhonchi no rales are noted at this time Skin: no rash seen on limited exam Musculoskeletal: No gross abnormality Psychiatric:unable to assess Neurologic:no involuntary movements         Lab Data:   Basic Metabolic Panel: Recent Labs  Lab 12/16/20 0207 12/17/20 0747 12/18/20 1036 12/19/20 0419 12/22/20 0451  NA 132*  --  136 135 131*  K 5.6* 5.1 4.5 4.6 4.8  CL 90*  --  91* 90* 86*  CO2 37*  --  39* 41* 38*  GLUCOSE 143*  --  215* 143* 185*  BUN 51*  --  43* 38* 38*  CREATININE 0.95  --  0.79 0.69 0.77  CALCIUM 9.6  --  9.7 9.6 9.9    ABG: Recent Labs  Lab 12/18/20 0915 12/18/20 1210 12/21/20 1113  PHART 7.193* 7.362 7.413  PCO2ART 107* 68.2* 61.5*  PO2ART 104 69.6* 61.9*  HCO3 39.5* 37.8* 38.7*  O2SAT 97.4 95.2 92.1    Liver Function Tests: No results for input(s): AST, ALT, ALKPHOS, BILITOT, PROT, ALBUMIN in the last 168 hours. No results for input(s): LIPASE, AMYLASE in the last 168 hours. No results for input(s): AMMONIA in the last 168 hours.  CBC: Recent Labs  Lab 12/16/20 0207 12/18/20 1036 12/22/20 0451  WBC 18.8* 22.1* 14.2*  HGB 10.7* 11.1* 11.1*  HCT 35.2* 38.8* 35.2*   MCV 96.7 101.0* 92.9  PLT 397 355 322    Cardiac Enzymes: No results for input(s): CKTOTAL, CKMB, CKMBINDEX, TROPONINI in the last 168 hours.  BNP (last 3 results) Recent Labs    01/04/20 1411 07/03/20 1654 11/08/20 0026  BNP 125.3* 118.4* 269.8*    ProBNP (last 3 results) No results for input(s): PROBNP in the last 8760 hours.  Radiological Exams: DG Chest Port 1 View  Result Date: 12/21/2020 CLINICAL DATA:  80 year old male with respiratory failure. History of lung and thyroid cancer. EXAM: PORTABLE CHEST 1 VIEW COMPARISON:  Portable chest 12/18/2020 and earlier. FINDINGS: Portable AP semi upright view at 0924 hours. Stable tracheostomy. Other thoracic inlet surgical clips suggesting thyroidectomy. Thoracic spine fusion hardware continuing to the lumbar spine. Continued low lung volumes. Stable cardiac size and mediastinal contours. Chronic lung base hypo ventilation, mildly improved since 12/18/2020, and consolidation demonstrated on CT Abdomen and Pelvis 12/16/2020 suspicious for drowned lung or pneumonia at that time. No superimposed pneumothorax or pulmonary edema. No definite consolidation. Small pleural effusions may persist. Paucity of bowel gas in the upper abdomen. IMPRESSION: Continued low lung volumes with improved basilar ventilation since 12/18/2020 compatible with regression of pneumonia or atelectasis. No new cardiopulmonary abnormality. Electronically Signed   By: Genevie Ann M.D.   On: 12/21/2020 09:54    Assessment/Plan Active Problems:   Adenocarcinoma of left lung, stage  1 ([facility])   Acute on chronic respiratory failure with hypoxia (HCC)   Atrial fibrillation ([place])   Supraglottic stenosis   Acute on chronic respiratory failure hypoxia we will continue with the T collar titrate oxygen continue secretion management pulmonary toilet.  Patient remains DNR per family wishes as well as the patient's own wishes Adenocarcinoma status postresection we will continue with  supportive care Atrial fibrillation rate now rate is controlled Sleep apnea nonissue   I have personally seen and evaluated the patient, evaluated laboratory and imaging results, formulated the assessment and plan and placed orders. The Patient requires high complexity decision making with multiple systems involvement.  Rounds were done with the Respiratory Therapy Director and Staff therapists and discussed with nursing staff also.  Allyne Gee, MD [facility] Pulmonary Critical Care Medicine Sleep Medicine

## 2020-12-25 ENCOUNTER — Other Ambulatory Visit (HOSPITAL_COMMUNITY): Payer: Self-pay

## 2020-12-25 DIAGNOSIS — J9621 Acute and chronic respiratory failure with hypoxia: Secondary | ICD-10-CM | POA: Diagnosis not present

## 2020-12-25 DIAGNOSIS — C3492 Malignant neoplasm of unspecified part of left bronchus or lung: Secondary | ICD-10-CM | POA: Diagnosis not present

## 2020-12-25 DIAGNOSIS — I480 Paroxysmal atrial fibrillation: Secondary | ICD-10-CM | POA: Diagnosis not present

## 2020-12-25 LAB — CBC
HCT: 35.9 % — ABNORMAL LOW (ref 39.0–52.0)
Hemoglobin: 11.3 g/dL — ABNORMAL LOW (ref 13.0–17.0)
MCH: 29.7 pg (ref 26.0–34.0)
MCHC: 31.5 g/dL (ref 30.0–36.0)
MCV: 94.2 fL (ref 80.0–100.0)
Platelets: 269 10*3/uL (ref 150–400)
RBC: 3.81 MIL/uL — ABNORMAL LOW (ref 4.22–5.81)
RDW: 15 % (ref 11.5–15.5)
WBC: 15.4 10*3/uL — ABNORMAL HIGH (ref 4.0–10.5)
nRBC: 0 % (ref 0.0–0.2)

## 2020-12-25 LAB — BASIC METABOLIC PANEL
Anion gap: 7 (ref 5–15)
BUN: 39 mg/dL — ABNORMAL HIGH (ref 8–23)
CO2: 35 mmol/L — ABNORMAL HIGH (ref 22–32)
Calcium: 9.8 mg/dL (ref 8.9–10.3)
Chloride: 89 mmol/L — ABNORMAL LOW (ref 98–111)
Creatinine, Ser: 0.83 mg/dL (ref 0.61–1.24)
GFR, Estimated: >60 mL/min (ref >60)
Glucose, Bld: 189 mg/dL — ABNORMAL HIGH (ref 70–99)
Potassium: 4.9 mmol/L (ref 3.5–5.1)
Sodium: 131 mmol/L — ABNORMAL LOW (ref 135–145)

## 2020-12-25 NOTE — Progress Notes (Signed)
Pulmonary [place]  PROGRESS NOTE     Devin [name]  ZJI:[id number]  DOB: 05-14-1941   DOA: 11/19/2020  Referring Physician: Merton Border, MD  HPI: Devin [name] is a 80 y.o. male being followed for ventilator/airway/oxygen weaning Acute on Chronic Respiratory Failure.  Patient currently is on T collar has been on 28% FiO2 he still has a cuffed trach which can be changed out  Medications: Reviewed on Rounds  Physical Exam:  Vitals: Temperature 97.2 pulse 85 respiratory 27 blood pressure is 154/71 saturations 100%  Ventilator Settings on T collar FiO2 28%  General: Comfortable at this time Neck: supple Cardiovascular: no malignant arrhythmias Respiratory: No rhonchi very coarse breath sounds Skin: no rash seen on limited exam Musculoskeletal: No gross abnormality Psychiatric:unable to assess Neurologic:no involuntary movements         Lab Data:   Basic Metabolic Panel: Recent Labs  Lab 12/19/20 0419 12/22/20 0451 12/25/20 0714  NA 135 131* 131*  K 4.6 4.8 4.9  CL 90* 86* 89*  CO2 41* 38* 35*  GLUCOSE 143* 185* 189*  BUN 38* 38* 39*  CREATININE 0.69 0.77 0.83  CALCIUM 9.6 9.9 9.8    ABG: Recent Labs  Lab 12/21/20 1113  PHART 7.413  PCO2ART 61.5*  PO2ART 61.9*  HCO3 38.7*  O2SAT 92.1    Liver Function Tests: No results for input(s): AST, ALT, ALKPHOS, BILITOT, PROT, ALBUMIN in the last 168 hours. No results for input(s): LIPASE, AMYLASE in the last 168 hours. No results for input(s): AMMONIA in the last 168 hours.  CBC: Recent Labs  Lab 12/22/20 0451 12/25/20 0714  WBC 14.2* 15.4*  HGB 11.1* 11.3*  HCT 35.2* 35.9*  MCV 92.9 94.2  PLT 322 269    Cardiac Enzymes: No results for input(s): CKTOTAL, CKMB, CKMBINDEX, TROPONINI in the last 168 hours.  BNP (last 3 results) Recent Labs    01/04/20 1411 07/03/20 1654 11/08/20 0026  BNP 125.3* 118.4* 269.8*     ProBNP (last 3 results) No results for input(s): PROBNP in the last 8760 hours.  Radiological Exams: DG CHEST PORT 1 VIEW  Result Date: 12/25/2020 CLINICAL DATA:  Respiratory failure. EXAM: PORTABLE CHEST 1 VIEW COMPARISON:  12/21/2020 FINDINGS: Low volume film with stable asymmetric elevation right hemidiaphragm. Bibasilar atelectasis again noted with small bilateral pleural effusions. No evidence for pulmonary edema. Telemetry leads overlie the chest. IMPRESSION: Low volume film with bibasilar atelectasis and small bilateral pleural effusions. Electronically Signed   By: Misty Stanley M.D.   On: 12/25/2020 06:34    Assessment/Plan Active Problems:   Adenocarcinoma of left lung, stage 1 (HCC)   Acute on chronic respiratory failure with hypoxia (HCC)   Atrial fibrillation (HCC)   Supraglottic stenosis   Acute on chronic respiratory failure hypoxia plan is going to be to continue with the T collar patient is at baseline.  Tracheostomy can be changed out to a cuffless trach at this stage. Adenocarcinoma status postresection Atrial fibrillation rate now rate is controlled we will continue to monitor closely. Tracheostomy status the tracheostomy will remain in place   I have personally seen and evaluated the patient, evaluated laboratory and imaging results, formulated the assessment and plan and placed orders. The Patient requires high complexity decision making with multiple systems involvement.  Rounds were done with the Respiratory Therapy Director and Staff therapists and discussed with nursing staff also.  Devin Gee, MD [facility] Pulmonary  Critical Care Medicine Sleep Medicine

## 2020-12-26 DIAGNOSIS — C3492 Malignant neoplasm of unspecified part of left bronchus or lung: Secondary | ICD-10-CM | POA: Diagnosis not present

## 2020-12-26 DIAGNOSIS — J9621 Acute and chronic respiratory failure with hypoxia: Secondary | ICD-10-CM | POA: Diagnosis not present

## 2020-12-26 DIAGNOSIS — I480 Paroxysmal atrial fibrillation: Secondary | ICD-10-CM | POA: Diagnosis not present

## 2020-12-26 NOTE — Progress Notes (Signed)
Pulmonary [place]  PROGRESS NOTE     Devin [name]  [id number]  DOB: [birth year]   DOA: 11/18/2020  Referring Physician: Merton Border, MD  HPI: Devin [name] is a 80 y.o. male being followed for ventilator/airway/oxygen weaning Acute on Chronic Respiratory Failure.  Patient is resting comfortably right now without distress has been on T collar currently is on 28% FiO2  Medications: Reviewed on Rounds  Physical Exam:  Vitals: Temperature is 96.6 pulse of 90 respiratory 22 blood pressure is 140/67 saturations 97%  Ventilator Settings on T collar FiO2 28%  General: Comfortable at this time Neck: supple Cardiovascular: no malignant arrhythmias Respiratory: Scattered rhonchi expansion is equal Skin: no rash seen on limited exam Musculoskeletal: No gross abnormality Psychiatric:unable to assess Neurologic:no involuntary movements         Lab Data:   Basic Metabolic Panel: Recent Labs  Lab 12/22/20 0451 12/25/20 0714  NA 131* 131*  K 4.8 4.9  CL 86* 89*  CO2 38* 35*  GLUCOSE 185* 189*  BUN 38* 39*  CREATININE 0.77 0.83  CALCIUM 9.9 9.8    ABG: Recent Labs  Lab 12/21/20 1113  PHART 7.413  PCO2ART 61.5*  PO2ART 61.9*  HCO3 38.7*  O2SAT 92.1    Liver Function Tests: No results for input(s): AST, ALT, ALKPHOS, BILITOT, PROT, ALBUMIN in the last 168 hours. No results for input(s): LIPASE, AMYLASE in the last 168 hours. No results for input(s): AMMONIA in the last 168 hours.  CBC: Recent Labs  Lab 12/22/20 0451 12/25/20 0714  WBC 14.2* 15.4*  HGB 11.1* 11.3*  HCT 35.2* 35.9*  MCV 92.9 94.2  PLT 322 269    Cardiac Enzymes: No results for input(s): CKTOTAL, CKMB, CKMBINDEX, TROPONINI in the last 168 hours.  BNP (last 3 results) Recent Labs    01/04/20 1411 07/03/20 1654 11/08/20 0026  BNP 125.3* 118.4* 269.8*    ProBNP (last 3 results) No results for  input(s): PROBNP in the last 8760 hours.  Radiological Exams: DG CHEST PORT 1 VIEW  Result Date: 12/25/2020 CLINICAL DATA:  Respiratory failure. EXAM: PORTABLE CHEST 1 VIEW COMPARISON:  12/21/2020 FINDINGS: Low volume film with stable asymmetric elevation right hemidiaphragm. Bibasilar atelectasis again noted with small bilateral pleural effusions. No evidence for pulmonary edema. Telemetry leads overlie the chest. IMPRESSION: Low volume film with bibasilar atelectasis and small bilateral pleural effusions. Electronically Signed   By: Misty Stanley M.D.   On: 12/25/2020 06:34    Assessment/Plan Active Problems:   Adenocarcinoma of left lung, stage 1 (HCC)   Acute on chronic respiratory failure with hypoxia (HCC)   Atrial fibrillation (HCC)   Supraglottic stenosis   Acute on chronic respiratory failure with hypoxia patient is doing fine on the T-piece on 28%.  Home training is underway for the family for discharge planning Adenocarcinoma supportive care we will monitor Chronic atrial fibrillation rate is controlled continue with supportive care Obstructive sleep apnea nonissue   I have personally seen and evaluated the patient, evaluated laboratory and imaging results, formulated the assessment and plan and placed orders. The Patient requires high complexity decision making with multiple systems involvement.  Rounds were done with the Respiratory Therapy Director and Staff therapists and discussed with nursing staff also.  Allyne Gee, MD [facility] Pulmonary Critical Care Medicine Sleep Medicine

## 2020-12-27 DIAGNOSIS — J9621 Acute and chronic respiratory failure with hypoxia: Secondary | ICD-10-CM | POA: Diagnosis not present

## 2020-12-27 DIAGNOSIS — C3492 Malignant neoplasm of unspecified part of left bronchus or lung: Secondary | ICD-10-CM | POA: Diagnosis not present

## 2020-12-27 DIAGNOSIS — I480 Paroxysmal atrial fibrillation: Secondary | ICD-10-CM | POA: Diagnosis not present

## 2020-12-27 NOTE — Progress Notes (Signed)
Pulmonary [place]  PROGRESS NOTE     Devin [name]  RRN:[id number]  DOB: 1940/09/24   DOA: 11/30/2020  Referring Physician: Merton Border, MD  HPI: Devin [name] is a 80 y.o. male being followed for ventilator/airway/oxygen weaning Acute on Chronic Respiratory Failure.  Patient is on T collar right now comfortable without distress family was in the room the daughter stated that they are looking at skilled nursing facility  Medications: Reviewed on Rounds  Physical Exam:  Vitals: Temperature is 97.4 pulse 84 respiratory rate is 22 blood pressure 155/81 saturations 99%  Ventilator Settings currently on T collar off the ventilator  General: Comfortable at this time Neck: supple Cardiovascular: no malignant arrhythmias Respiratory: No rhonchi very coarse breath sounds Skin: no rash seen on limited exam Musculoskeletal: No gross abnormality Psychiatric:unable to assess Neurologic:no involuntary movements         Lab Data:   Basic Metabolic Panel: Recent Labs  Lab 12/22/20 0451 12/25/20 0714  NA 131* 131*  K 4.8 4.9  CL 86* 89*  CO2 38* 35*  GLUCOSE 185* 189*  BUN 38* 39*  CREATININE 0.77 0.83  CALCIUM 9.9 9.8    ABG: Recent Labs  Lab 12/21/20 1113  PHART 7.413  PCO2ART 61.5*  PO2ART 61.9*  HCO3 38.7*  O2SAT 92.1    Liver Function Tests: No results for input(s): AST, ALT, ALKPHOS, BILITOT, PROT, ALBUMIN in the last 168 hours. No results for input(s): LIPASE, AMYLASE in the last 168 hours. No results for input(s): AMMONIA in the last 168 hours.  CBC: Recent Labs  Lab 12/22/20 0451 12/25/20 0714  WBC 14.2* 15.4*  HGB 11.1* 11.3*  HCT 35.2* 35.9*  MCV 92.9 94.2  PLT 322 269    Cardiac Enzymes: No results for input(s): CKTOTAL, CKMB, CKMBINDEX, TROPONINI in the last 168 hours.  BNP (last 3 results) Recent Labs    01/04/20 1411 07/03/20 1654 11/08/20 0026   BNP 125.3* 118.4* 269.8*    ProBNP (last 3 results) No results for input(s): PROBNP in the last 8760 hours.  Radiological Exams: No results found.  Assessment/Plan Active Problems:   Adenocarcinoma of left lung, stage 1 (HCC)   Acute on chronic respiratory failure with hypoxia (HCC)   Atrial fibrillation (HCC)   Supraglottic stenosis   Acute on chronic respiratory failure hypoxia plan is going to be to continue with the T collar trials titrate oxygen continue pulmonary toilet plan on discharging to skilled nursing Atrial fibrillation rate and rate controlled we will continue to follow along closely Adenocarcinoma of the lung status post resection Sleep apnea nonissue right now   I have personally seen and evaluated the patient, evaluated laboratory and imaging results, formulated the assessment and plan and placed orders. The Patient requires high complexity decision making with multiple systems involvement.  Rounds were done with the Respiratory Therapy Director and Staff therapists and discussed with nursing staff also.  Allyne Gee, MD [facility] Pulmonary Critical Care Medicine Sleep Medicine

## 2020-12-28 DIAGNOSIS — I480 Paroxysmal atrial fibrillation: Secondary | ICD-10-CM | POA: Diagnosis not present

## 2020-12-28 DIAGNOSIS — C3492 Malignant neoplasm of unspecified part of left bronchus or lung: Secondary | ICD-10-CM | POA: Diagnosis not present

## 2020-12-28 DIAGNOSIS — J9621 Acute and chronic respiratory failure with hypoxia: Secondary | ICD-10-CM | POA: Diagnosis not present

## 2020-12-28 NOTE — Progress Notes (Signed)
Pulmonary [name]  PROGRESS NOTE     [name]  YWV:[id number]  DOB: May 26, 1941   DOA: 12/03/2020  Referring Physician: Merton Border, MD  HPI: Devin [name] is a 80 y.o. male being followed for ventilator/airway/oxygen weaning Acute on Chronic Respiratory Failure.  Patient at this time is on T collar using the PMV awaiting discharge planning for skilled facility  Medications: Reviewed on Rounds  Physical Exam:  Vitals: Temperature is 96.2 pulse 92 respiratory 16 blood pressure is 159/77 saturations 100%  Ventilator Settings off the ventilator on T collar at this time.  General: Comfortable at this time Neck: supple Cardiovascular: no malignant arrhythmias Respiratory: Scattered rhonchi expansion is equal Skin: no rash seen on limited exam Musculoskeletal: No gross abnormality Psychiatric:unable to assess Neurologic:no involuntary movements         Lab Data:   Basic Metabolic Panel: Recent Labs  Lab 12/22/20 0451 12/25/20 0714  NA 131* 131*  K 4.8 4.9  CL 86* 89*  CO2 38* 35*  GLUCOSE 185* 189*  BUN 38* 39*  CREATININE 0.77 0.83  CALCIUM 9.9 9.8    ABG: Recent Labs  Lab 12/21/20 1113  PHART 7.413  PCO2ART 61.5*  PO2ART 61.9*  HCO3 38.7*  O2SAT 92.1    Liver Function Tests: No results for input(s): AST, ALT, ALKPHOS, BILITOT, PROT, ALBUMIN in the last 168 hours. No results for input(s): LIPASE, AMYLASE in the last 168 hours. No results for input(s): AMMONIA in the last 168 hours.  CBC: Recent Labs  Lab 12/22/20 0451 12/25/20 0714  WBC 14.2* 15.4*  HGB 11.1* 11.3*  HCT 35.2* 35.9*  MCV 92.9 94.2  PLT 322 269    Cardiac Enzymes: No results for input(s): CKTOTAL, CKMB, CKMBINDEX, TROPONINI in the last 168 hours.  BNP (last 3 results) Recent Labs    01/04/20 1411 07/03/20 1654 11/08/20 0026  BNP 125.3* 118.4* 269.8*    ProBNP (last 3  results) No results for input(s): PROBNP in the last 8760 hours.  Radiological Exams: No results found.  Assessment/Plan Active Problems:   Adenocarcinoma of left lung, stage 1 (HCC)   Acute on chronic respiratory failure with hypoxia (HCC)   Atrial fibrillation (HCC)   Supraglottic stenosis   Acute on chronic respiratory failure with hypoxia currently is on T-piece using PMV.  Plan is going to be to continue with the T-piece.  Patient is at baseline Adenocarcinoma of the lung status postresection Chronic atrial fibrillation regular rate is controlled Sleep apnea nonissue   I have personally seen and evaluated the patient, evaluated laboratory and imaging results, formulated the assessment and plan and placed orders. The Patient requires high complexity decision making with multiple systems involvement.  Rounds were done with the Respiratory Therapy Director and Staff therapists and discussed with nursing staff also.  Allyne Gee, MD [facility] Pulmonary Critical Care Medicine Sleep Medicine

## 2020-12-29 ENCOUNTER — Other Ambulatory Visit (HOSPITAL_COMMUNITY): Payer: Self-pay

## 2020-12-29 DIAGNOSIS — J9621 Acute and chronic respiratory failure with hypoxia: Secondary | ICD-10-CM | POA: Diagnosis not present

## 2020-12-29 DIAGNOSIS — I480 Paroxysmal atrial fibrillation: Secondary | ICD-10-CM | POA: Diagnosis not present

## 2020-12-29 DIAGNOSIS — C3492 Malignant neoplasm of unspecified part of left bronchus or lung: Secondary | ICD-10-CM | POA: Diagnosis not present

## 2020-12-29 LAB — CBC
HCT: 37.3 % — ABNORMAL LOW (ref 39.0–52.0)
Hemoglobin: 11.7 g/dL — ABNORMAL LOW (ref 13.0–17.0)
MCH: 29.3 pg (ref 26.0–34.0)
MCHC: 31.4 g/dL (ref 30.0–36.0)
MCV: 93.5 fL (ref 80.0–100.0)
Platelets: 300 10*3/uL (ref 150–400)
RBC: 3.99 MIL/uL — ABNORMAL LOW (ref 4.22–5.81)
RDW: 15.1 % (ref 11.5–15.5)
WBC: 20.2 10*3/uL — ABNORMAL HIGH (ref 4.0–10.5)
nRBC: 0 % (ref 0.0–0.2)

## 2020-12-29 LAB — BASIC METABOLIC PANEL
Anion gap: 8 (ref 5–15)
BUN: 39 mg/dL — ABNORMAL HIGH (ref 8–23)
CO2: 39 mmol/L — ABNORMAL HIGH (ref 22–32)
Calcium: 9.9 mg/dL (ref 8.9–10.3)
Chloride: 83 mmol/L — ABNORMAL LOW (ref 98–111)
Creatinine, Ser: 0.77 mg/dL (ref 0.61–1.24)
GFR, Estimated: >60 mL/min (ref >60)
Glucose, Bld: 110 mg/dL — ABNORMAL HIGH (ref 70–99)
Potassium: 5.7 mmol/L — ABNORMAL HIGH (ref 3.5–5.1)
Sodium: 130 mmol/L — ABNORMAL LOW (ref 135–145)

## 2020-12-29 LAB — BLOOD GAS, ARTERIAL
Acid-Base Excess: 14.1 mmol/L — ABNORMAL HIGH (ref 0.0–2.0)
Bicarbonate: 40.6 mmol/L — ABNORMAL HIGH (ref 20.0–28.0)
FIO2: 35
O2 Saturation: 94.7 %
Patient temperature: 37
pCO2 arterial: 80.7 mmHg (ref 32.0–48.0)
pH, Arterial: 7.323 — ABNORMAL LOW (ref 7.350–7.450)
pO2, Arterial: 77.1 mmHg — ABNORMAL LOW (ref 83.0–108.0)

## 2020-12-29 NOTE — Progress Notes (Signed)
Pulmonary [place]  PROGRESS NOTE     Devin [name]  OVF:[id number]  DOB: 1941-05-22   DOA: 12/07/2020  Referring Physician: Merton Border, MD  HPI: Devin [name] is a 80 y.o. male being followed for ventilator/airway/oxygen weaning Acute on Chronic Respiratory Failure.  Patient currently is on T collar has been having some issues with copious secretions.  Also was more somnolent this morning.  Spoke with respiratory therapy during rounds to get an ABG  Medications: Reviewed on Rounds  Physical Exam:  Vitals: Temperature 98.9 pulse 85 respiratory 18 blood pressure is 112/61 saturations 99%  Ventilator Settings currently on T collar FiO2 35%  General: Comfortable at this time Neck: supple Cardiovascular: no malignant arrhythmias Respiratory: Scattered rhonchi expiratory Skin: no rash seen on limited exam Musculoskeletal: No gross abnormality Psychiatric:unable to assess Neurologic:no involuntary movements         Lab Data:   Basic Metabolic Panel: Recent Labs  Lab 12/25/20 0714  NA 131*  K 4.9  CL 89*  CO2 35*  GLUCOSE 189*  BUN 39*  CREATININE 0.83  CALCIUM 9.8    ABG: Recent Labs  Lab 12/29/20 0758  PHART 7.323*  PCO2ART 80.7*  PO2ART 77.1*  HCO3 40.6*  O2SAT 94.7    Liver Function Tests: No results for input(s): AST, ALT, ALKPHOS, BILITOT, PROT, ALBUMIN in the last 168 hours. No results for input(s): LIPASE, AMYLASE in the last 168 hours. No results for input(s): AMMONIA in the last 168 hours.  CBC: Recent Labs  Lab 12/25/20 0714  WBC 15.4*  HGB 11.3*  HCT 35.9*  MCV 94.2  PLT 269    Cardiac Enzymes: No results for input(s): CKTOTAL, CKMB, CKMBINDEX, TROPONINI in the last 168 hours.  BNP (last 3 results) Recent Labs    01/04/20 1411 07/03/20 1654 11/08/20 0026  BNP 125.3* 118.4* 269.8*    ProBNP (last 3 results) No results for input(s):  PROBNP in the last 8760 hours.  Radiological Exams: DG CHEST PORT 1 VIEW  Result Date: 12/29/2020 CLINICAL DATA:  Respiratory failure EXAM: PORTABLE CHEST 1 VIEW COMPARISON:  12/25/2020 FINDINGS: Chronic low volume chest. Pleural fluid and pleural thickening on the left with volume loss. No acute opacity. No pneumothorax. Tracheostomy tube in good position. Normal heart size. Extensive thoracolumbar fusion. IMPRESSION: 1. Stable from 4 days ago. 2. Chronic left pleural effusion with atelectasis/scarring at the bases. Electronically Signed   By: Monte Fantasia M.D.   On: 12/29/2020 08:17    Assessment/Plan Active Problems:   Adenocarcinoma of left lung, stage 1 (HCC)   Acute on chronic respiratory failure with hypoxia (HCC)   Atrial fibrillation (HCC)   Supraglottic stenosis   Acute on chronic respiratory failure hypoxia we did an ABG pH was 7.32 PCO2 80.7 PO2 77 the patient is more somnolent.  Spoke with respiratory therapy will review medications and make sure he is not on any respiratory depressants.  Per the patient's request not to be placed back on the ventilator Adenocarcinoma stage I status post resection Atrial fibrillation rate controlled Sleep apnea patient has tracheostomy in place I suspect that some of his PCO2 increase may be related to hypoventilation during the night   I have personally seen and evaluated the patient, evaluated laboratory and imaging results, formulated the assessment and plan and placed orders. The Patient requires high complexity decision making with multiple systems involvement.  Rounds were done with the Respiratory Therapy  Librarian, academic therapists and discussed with nursing staff also.  Allyne Gee, MD [facility] Pulmonary Critical Care Medicine Sleep Medicine

## 2020-12-30 LAB — URINALYSIS, ROUTINE W REFLEX MICROSCOPIC
Bilirubin Urine: NEGATIVE
Glucose, UA: 50 mg/dL — AB
Hgb urine dipstick: NEGATIVE
Ketones, ur: NEGATIVE mg/dL
Leukocytes,Ua: NEGATIVE
Nitrite: NEGATIVE
Protein, ur: NEGATIVE mg/dL
Specific Gravity, Urine: 1.017 (ref 1.005–1.030)
pH: 5 (ref 5.0–8.0)

## 2020-12-30 LAB — EXPECTORATED SPUTUM ASSESSMENT W GRAM STAIN, RFLX TO RESP C

## 2020-12-30 LAB — POTASSIUM: Potassium: 4.7 mmol/L (ref 3.5–5.1)

## 2020-12-31 LAB — URINE CULTURE: Culture: <10000 — AB

## 2021-01-01 DIAGNOSIS — I480 Paroxysmal atrial fibrillation: Secondary | ICD-10-CM | POA: Diagnosis not present

## 2021-01-01 DIAGNOSIS — J9621 Acute and chronic respiratory failure with hypoxia: Secondary | ICD-10-CM | POA: Diagnosis not present

## 2021-01-01 DIAGNOSIS — C3492 Malignant neoplasm of unspecified part of left bronchus or lung: Secondary | ICD-10-CM | POA: Diagnosis not present

## 2021-01-01 LAB — BASIC METABOLIC PANEL
Anion gap: 4 — ABNORMAL LOW (ref 5–15)
Anion gap: 5 (ref 5–15)
BUN: 38 mg/dL — ABNORMAL HIGH (ref 8–23)
BUN: 38 mg/dL — ABNORMAL HIGH (ref 8–23)
CO2: 42 mmol/L — ABNORMAL HIGH (ref 22–32)
CO2: 42 mmol/L — ABNORMAL HIGH (ref 22–32)
Calcium: 9.4 mg/dL (ref 8.9–10.3)
Calcium: 9.6 mg/dL (ref 8.9–10.3)
Chloride: 82 mmol/L — ABNORMAL LOW (ref 98–111)
Chloride: 83 mmol/L — ABNORMAL LOW (ref 98–111)
Creatinine, Ser: 0.73 mg/dL (ref 0.61–1.24)
Creatinine, Ser: 0.72 mg/dL (ref 0.61–1.24)
GFR, Estimated: >60 mL/min (ref >60)
GFR, Estimated: >60 mL/min (ref >60)
Glucose, Bld: 140 mg/dL — ABNORMAL HIGH (ref 70–99)
Glucose, Bld: 154 mg/dL — ABNORMAL HIGH (ref 70–99)
Potassium: 4.8 mmol/L (ref 3.5–5.1)
Potassium: 5 mmol/L (ref 3.5–5.1)
Sodium: 128 mmol/L — ABNORMAL LOW (ref 135–145)
Sodium: 130 mmol/L — ABNORMAL LOW (ref 135–145)

## 2021-01-01 LAB — CBC
HCT: 34.8 % — ABNORMAL LOW (ref 39.0–52.0)
Hemoglobin: 10.8 g/dL — ABNORMAL LOW (ref 13.0–17.0)
MCH: 29.2 pg (ref 26.0–34.0)
MCHC: 31 g/dL (ref 30.0–36.0)
MCV: 94.1 fL (ref 80.0–100.0)
Platelets: 246 10*3/uL (ref 150–400)
RBC: 3.7 MIL/uL — ABNORMAL LOW (ref 4.22–5.81)
RDW: 15.1 % (ref 11.5–15.5)
WBC: 13.7 10*3/uL — ABNORMAL HIGH (ref 4.0–10.5)
nRBC: 0 % (ref 0.0–0.2)

## 2021-01-01 LAB — SODIUM, URINE, RANDOM: Sodium, Ur: 31 mmol/L

## 2021-01-01 NOTE — Progress Notes (Signed)
Pulmonary [place]  PROGRESS NOTE     Devin [name]  YHC:[id number]  DOB: 1940-09-28   DOA: 11/15/2020  Referring Physician: Merton Border, MD  HPI: Devin [name] is a 80 y.o. male being followed for ventilator/airway/oxygen weaning Acute on Chronic Respiratory Failure.  Patient currently is on T collar has been on 35% FiO2 secretions are reportedly copious  Medications: Reviewed on Rounds  Physical Exam:  Vitals: Temperature is 96.3 pulse 94 respiratory 27 blood pressure is 149/73 saturations are 97%  Ventilator Settings on T collar FiO2 is 35%  General: Comfortable at this time Neck: supple Cardiovascular: no malignant arrhythmias Respiratory: No rhonchi very coarse breath sound Skin: no rash seen on limited exam Musculoskeletal: No gross abnormality Psychiatric:unable to assess Neurologic:no involuntary movements         Lab Data:   Basic Metabolic Panel: Recent Labs  Lab 12/29/20 1115 12/30/20 0642 01/01/21 0637 01/01/21 1109  NA 130*  --  128* 130*  K 5.7* 4.7 4.8 5.0  CL 83*  --  82* 83*  CO2 39*  --  42* 42*  GLUCOSE 110*  --  140* 154*  BUN 39*  --  38* 38*  CREATININE 0.77  --  0.73 0.72  CALCIUM 9.9  --  9.4 9.6    ABG: Recent Labs  Lab 12/29/20 0758  PHART 7.323*  PCO2ART 80.7*  PO2ART 77.1*  HCO3 40.6*  O2SAT 94.7    Liver Function Tests: No results for input(s): AST, ALT, ALKPHOS, BILITOT, PROT, ALBUMIN in the last 168 hours. No results for input(s): LIPASE, AMYLASE in the last 168 hours. No results for input(s): AMMONIA in the last 168 hours.  CBC: Recent Labs  Lab 12/29/20 1115 01/01/21 0637  WBC 20.2* 13.7*  HGB 11.7* 10.8*  HCT 37.3* 34.8*  MCV 93.5 94.1  PLT 300 246    Cardiac Enzymes: No results for input(s): CKTOTAL, CKMB, CKMBINDEX, TROPONINI in the last 168 hours.  BNP (last 3 results) Recent Labs    01/04/20 1411  07/03/20 1654 11/08/20 0026  BNP 125.3* 118.4* 269.8*    ProBNP (last 3 results) No results for input(s): PROBNP in the last 8760 hours.  Radiological Exams: No results found.  Assessment/Plan Active Problems:   Adenocarcinoma of left lung, stage 1 (HCC)   Acute on chronic respiratory failure with hypoxia (HCC)   Atrial fibrillation (HCC)   Supraglottic stenosis   Acute on chronic respiratory failure hypoxia plan is to continue with the T collar titrate oxygen as tolerated continue pulmonary toilet.  Patient continues to do poorly is DNR and not to be placed on the ventilator.  Working on skilled placement Chronic atrial fibrillation rate now rate is controlled Adenocarcinoma of the lung status postresection   I have personally seen and evaluated the patient, evaluated laboratory and imaging results, formulated the assessment and plan and placed orders. The Patient requires high complexity decision making with multiple systems involvement.  Rounds were done with the Respiratory Therapy Director and Staff therapists and discussed with nursing staff also.  Allyne Gee, MD [facility] Pulmonary Critical Care Medicine Sleep Medicine

## 2021-01-02 DIAGNOSIS — I480 Paroxysmal atrial fibrillation: Secondary | ICD-10-CM | POA: Diagnosis not present

## 2021-01-02 DIAGNOSIS — C3492 Malignant neoplasm of unspecified part of left bronchus or lung: Secondary | ICD-10-CM | POA: Diagnosis not present

## 2021-01-02 DIAGNOSIS — J9621 Acute and chronic respiratory failure with hypoxia: Secondary | ICD-10-CM | POA: Diagnosis not present

## 2021-01-02 LAB — CULTURE, RESPIRATORY W GRAM STAIN

## 2021-01-02 LAB — POTASSIUM: Potassium: 4.7 mmol/L (ref 3.5–5.1)

## 2021-01-02 NOTE — Progress Notes (Signed)
Pulmonary [place]  PROGRESS NOTE     Devin [name]  [id number]  DOB: April 06, 1941   DOA: 11/20/2020  Referring Physician: Merton Border, MD  HPI: Devin [name] is a 80 y.o. male being followed for ventilator/airway/oxygen weaning Acute on Chronic Respiratory Failure.  Patient is on T collar currently comfortable without distress at this time no fevers are noted  Medications: Reviewed on Rounds  Physical Exam:  Vitals: Temperature is 96.6 pulse 85 respiratory is 18 blood pressure 156/77  Ventilator Settings on T collar FiO2 is 35%  General: Comfortable at this time Neck: supple Cardiovascular: no malignant arrhythmias Respiratory: No rhonchi or rales Skin: no rash seen on limited exam Musculoskeletal: No gross abnormality Psychiatric:unable to assess Neurologic:no involuntary movements         Lab Data:   Basic Metabolic Panel: Recent Labs  Lab 12/29/20 1115 12/30/20 0642 01/01/21 0637 01/01/21 1109 01/02/21 0448  NA 130*  --  128* 130*  --   K 5.7* 4.7 4.8 5.0 4.7  CL 83*  --  82* 83*  --   CO2 39*  --  42* 42*  --   GLUCOSE 110*  --  140* 154*  --   BUN 39*  --  38* 38*  --   CREATININE 0.77  --  0.73 0.72  --   CALCIUM 9.9  --  9.4 9.6  --     ABG: Recent Labs  Lab 12/29/20 0758  PHART 7.323*  PCO2ART 80.7*  PO2ART 77.1*  HCO3 40.6*  O2SAT 94.7    Liver Function Tests: No results for input(s): AST, ALT, ALKPHOS, BILITOT, PROT, ALBUMIN in the last 168 hours. No results for input(s): LIPASE, AMYLASE in the last 168 hours. No results for input(s): AMMONIA in the last 168 hours.  CBC: Recent Labs  Lab 12/29/20 1115 01/01/21 0637  WBC 20.2* 13.7*  HGB 11.7* 10.8*  HCT 37.3* 34.8*  MCV 93.5 94.1  PLT 300 246    Cardiac Enzymes: No results for input(s): CKTOTAL, CKMB, CKMBINDEX, TROPONINI in the last 168 hours.  BNP (last 3 results) Recent Labs     01/04/20 1411 07/03/20 1654 11/08/20 0026  BNP 125.3* 118.4* 269.8*    ProBNP (last 3 results) No results for input(s): PROBNP in the last 8760 hours.  Radiological Exams: No results found.  Assessment/Plan Active Problems:   Adenocarcinoma of left lung, stage 1 (HCC)   Acute on chronic respiratory failure with hypoxia (HCC)   Atrial fibrillation (HCC)   Supraglottic stenosis   Acute on chronic respiratory failure with hypoxia patient is currently on T collar has been on 35% FiO2 we will continue with the T-piece.  Patient is not to be placed back on the ventilator. Adenocarcinoma stage I continue with supportive care patient is status post resection Atrial fibrillation rate is controlled Sleep apnea nonissue patient has trach   I have personally seen and evaluated the patient, evaluated laboratory and imaging results, formulated the assessment and plan and placed orders. The Patient requires high complexity decision making with multiple systems involvement.  Rounds were done with the Respiratory Therapy Director and Staff therapists and discussed with nursing staff also.  Allyne Gee, MD [facility] Pulmonary Critical Care Medicine Sleep Medicine

## 2021-01-03 ENCOUNTER — Ambulatory Visit (HOSPITAL_COMMUNITY): Payer: Medicare Other | Admitting: Physician Assistant

## 2021-01-03 DIAGNOSIS — I480 Paroxysmal atrial fibrillation: Secondary | ICD-10-CM | POA: Diagnosis not present

## 2021-01-03 DIAGNOSIS — C3492 Malignant neoplasm of unspecified part of left bronchus or lung: Secondary | ICD-10-CM | POA: Diagnosis not present

## 2021-01-03 DIAGNOSIS — J9621 Acute and chronic respiratory failure with hypoxia: Secondary | ICD-10-CM | POA: Diagnosis not present

## 2021-01-03 NOTE — Progress Notes (Signed)
Pulmonary [place]  PROGRESS NOTE     Devin [name]  WER:[id number]  DOB: 12/30/1940   DOA: 11/17/2020  Referring Physician: Merton Border, MD  HPI: Devin [name] is a 80 y.o. male being followed for ventilator/airway/oxygen weaning Acute on Chronic Respiratory Failure.  Patient right now is afebrile without distress at this time has been on T collar currently requiring 30% FiO2 secretions seem to be improving  Medications: Reviewed on Rounds  Physical Exam:  Vitals: Temperature is 98.0 pulse 95 respiratory 18 blood pressure is 145/75 saturations 100%  Ventilator Settings on T collar FiO2 30%  General: Comfortable at this time Neck: supple Cardiovascular: no malignant arrhythmias Respiratory: Scattered rhonchi expansion is equal Skin: no rash seen on limited exam Musculoskeletal: No gross abnormality Psychiatric:unable to assess Neurologic:no involuntary movements         Lab Data:   Basic Metabolic Panel: Recent Labs  Lab 12/29/20 1115 12/30/20 0642 01/01/21 0637 01/01/21 1109 01/02/21 0448  NA 130*  --  128* 130*  --   K 5.7* 4.7 4.8 5.0 4.7  CL 83*  --  82* 83*  --   CO2 39*  --  42* 42*  --   GLUCOSE 110*  --  140* 154*  --   BUN 39*  --  38* 38*  --   CREATININE 0.77  --  0.73 0.72  --   CALCIUM 9.9  --  9.4 9.6  --     ABG: Recent Labs  Lab 12/29/20 0758  PHART 7.323*  PCO2ART 80.7*  PO2ART 77.1*  HCO3 40.6*  O2SAT 94.7    Liver Function Tests: No results for input(s): AST, ALT, ALKPHOS, BILITOT, PROT, ALBUMIN in the last 168 hours. No results for input(s): LIPASE, AMYLASE in the last 168 hours. No results for input(s): AMMONIA in the last 168 hours.  CBC: Recent Labs  Lab 12/29/20 1115 01/01/21 0637  WBC 20.2* 13.7*  HGB 11.7* 10.8*  HCT 37.3* 34.8*  MCV 93.5 94.1  PLT 300 246    Cardiac Enzymes: No results for input(s): CKTOTAL, CKMB,  CKMBINDEX, TROPONINI in the last 168 hours.  BNP (last 3 results) Recent Labs    01/04/20 1411 07/03/20 1654 11/08/20 0026  BNP 125.3* 118.4* 269.8*    ProBNP (last 3 results) No results for input(s): PROBNP in the last 8760 hours.  Radiological Exams: No results found.  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 The Patient requires high complexity decision making with multiple systems involvement.  Rounds were done with the Respiratory Therapy Director and Staff therapists and discussed with nursing staff also.  Allyne Gee, MD [facility] Pulmonary Critical Care Medicine Sleep Medicine

## 2021-01-04 DIAGNOSIS — J9621 Acute and chronic respiratory failure with hypoxia: Secondary | ICD-10-CM | POA: Diagnosis not present

## 2021-01-04 DIAGNOSIS — I480 Paroxysmal atrial fibrillation: Secondary | ICD-10-CM | POA: Diagnosis not present

## 2021-01-04 DIAGNOSIS — C3492 Malignant neoplasm of unspecified part of left bronchus or lung: Secondary | ICD-10-CM | POA: Diagnosis not present

## 2021-01-04 LAB — BASIC METABOLIC PANEL
Anion gap: 7 (ref 5–15)
BUN: 31 mg/dL — ABNORMAL HIGH (ref 8–23)
CO2: 37 mmol/L — ABNORMAL HIGH (ref 22–32)
Calcium: 9.1 mg/dL (ref 8.9–10.3)
Chloride: 86 mmol/L — ABNORMAL LOW (ref 98–111)
Creatinine, Ser: 0.71 mg/dL (ref 0.61–1.24)
GFR, Estimated: >60 mL/min (ref >60)
Glucose, Bld: 139 mg/dL — ABNORMAL HIGH (ref 70–99)
Potassium: 4.6 mmol/L (ref 3.5–5.1)
Sodium: 130 mmol/L — ABNORMAL LOW (ref 135–145)

## 2021-01-04 LAB — CBC
HCT: 32.3 % — ABNORMAL LOW (ref 39.0–52.0)
Hemoglobin: 10.3 g/dL — ABNORMAL LOW (ref 13.0–17.0)
MCH: 29 pg (ref 26.0–34.0)
MCHC: 31.9 g/dL (ref 30.0–36.0)
MCV: 91 fL (ref 80.0–100.0)
Platelets: 235 10*3/uL (ref 150–400)
RBC: 3.55 MIL/uL — ABNORMAL LOW (ref 4.22–5.81)
RDW: 16 % — ABNORMAL HIGH (ref 11.5–15.5)
WBC: 10.4 10*3/uL (ref 4.0–10.5)
nRBC: 0 % (ref 0.0–0.2)

## 2021-01-04 LAB — MAGNESIUM: Magnesium: 2 mg/dL (ref 1.7–2.4)

## 2021-01-04 NOTE — Progress Notes (Signed)
Pulmonary [place]  PROGRESS NOTE     Devin [name]  ZTI:[id number]  DOB: 09-08-1940   DOA: 12/03/2020  Referring Physician: Merton Border, MD  HPI: Devin [name] is a 80 y.o. male being followed for ventilator/airway/oxygen weaning Acute on Chronic Respiratory Failure.  Patient is resting comfortably right now without distress at this time remains on the T collar  Medications: Reviewed on Rounds  Physical Exam:  Vitals: Temperature is 97.2 pulse 92 respiratory 22 blood pressure is 137/70 saturations 98%  Ventilator Settings on T collar with an FiO2 of 40%  General: Comfortable at this time Neck: supple Cardiovascular: no malignant arrhythmias Respiratory: No rhonchi no rales noted at this time Skin: no rash seen on limited exam Musculoskeletal: No gross abnormality Psychiatric:unable to assess Neurologic:no involuntary movements         Lab Data:   Basic Metabolic Panel: Recent Labs  Lab 12/29/20 1115 12/30/20 0642 01/01/21 0637 01/01/21 1109 01/02/21 0448 01/04/21 0416  NA 130*  --  128* 130*  --  130*  K 5.7* 4.7 4.8 5.0 4.7 4.6  CL 83*  --  82* 83*  --  86*  CO2 39*  --  42* 42*  --  37*  GLUCOSE 110*  --  140* 154*  --  139*  BUN 39*  --  38* 38*  --  31*  CREATININE 0.77  --  0.73 0.72  --  0.71  CALCIUM 9.9  --  9.4 9.6  --  9.1  MG  --   --   --   --   --  2.0    ABG: Recent Labs  Lab 12/29/20 0758  PHART 7.323*  PCO2ART 80.7*  PO2ART 77.1*  HCO3 40.6*  O2SAT 94.7    Liver Function Tests: No results for input(s): AST, ALT, ALKPHOS, BILITOT, PROT, ALBUMIN in the last 168 hours. No results for input(s): LIPASE, AMYLASE in the last 168 hours. No results for input(s): AMMONIA in the last 168 hours.  CBC: Recent Labs  Lab 12/29/20 1115 01/01/21 0637 01/04/21 0416  WBC 20.2* 13.7* 10.4  HGB 11.7* 10.8* 10.3*  HCT 37.3* 34.8* 32.3*  MCV 93.5 94.1 91.0   PLT 300 246 235    Cardiac Enzymes: No results for input(s): CKTOTAL, CKMB, CKMBINDEX, TROPONINI in the last 168 hours.  BNP (last 3 results) Recent Labs    07/03/20 1654 11/08/20 0026  BNP 118.4* 269.8*    ProBNP (last 3 results) No results for input(s): PROBNP in the last 8760 hours.  Radiological Exams: No results found.  Assessment/Plan Active Problems:   Adenocarcinoma of left lung, stage 1 (HCC)   Acute on chronic respiratory failure with hypoxia (HCC)   Atrial fibrillation (HCC)   Supraglottic stenosis   Acute on chronic respiratory failure hypoxia plan is to continue with the T-piece titrate oxygen as tolerated continue pulmonary toilet. 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 The Patient requires high complexity decision making with multiple systems involvement.  Rounds were done with the Respiratory Therapy Director and Staff therapists and discussed with nursing staff also.  Allyne Gee, MD [facility] Pulmonary Critical Care Medicine Sleep Medicine

## 2021-01-05 DIAGNOSIS — J9621 Acute and chronic respiratory failure with hypoxia: Secondary | ICD-10-CM | POA: Diagnosis not present

## 2021-01-05 DIAGNOSIS — I480 Paroxysmal atrial fibrillation: Secondary | ICD-10-CM | POA: Diagnosis not present

## 2021-01-05 DIAGNOSIS — C3492 Malignant neoplasm of unspecified part of left bronchus or lung: Secondary | ICD-10-CM | POA: Diagnosis not present

## 2021-01-05 NOTE — Progress Notes (Signed)
Pulmonary [place]  PROGRESS NOTE     RAYFIELD BEEM  JJK:[id number]  DOB: [birth year]   DOA: 11/10/2020  Referring Physician: Merton Border, MD  HPI: Devin [name] is a 80 y.o. male being followed for ventilator/airway/oxygen weaning Acute on Chronic Respiratory Failure.  Patient is comfortable right now without distress has been afebrile  Medications: Reviewed on Rounds  Physical Exam:  Vitals: Temperature is 96.4 pulse 83 respiratory is 15 blood pressure is 147/70 saturations 98%  Ventilator Settings off the ventilator currently on T collar  General: Comfortable at this time Neck: supple Cardiovascular: no malignant arrhythmias Respiratory: No rhonchi no rales are noted at this time Skin: no rash seen on limited exam Musculoskeletal: No gross abnormality Psychiatric:unable to assess Neurologic:no involuntary movements         Lab Data:   Basic Metabolic Panel: Recent Labs  Lab 12/29/20 1115 12/30/20 0642 01/01/21 0637 01/01/21 1109 01/02/21 0448 01/04/21 0416  NA 130*  --  128* 130*  --  130*  K 5.7* 4.7 4.8 5.0 4.7 4.6  CL 83*  --  82* 83*  --  86*  CO2 39*  --  42* 42*  --  37*  GLUCOSE 110*  --  140* 154*  --  139*  BUN 39*  --  38* 38*  --  31*  CREATININE 0.77  --  0.73 0.72  --  0.71  CALCIUM 9.9  --  9.4 9.6  --  9.1  MG  --   --   --   --   --  2.0    ABG: No results for input(s): PHART, PCO2ART, PO2ART, HCO3, O2SAT in the last 168 hours.  Liver Function Tests: No results for input(s): AST, ALT, ALKPHOS, BILITOT, PROT, ALBUMIN in the last 168 hours. No results for input(s): LIPASE, AMYLASE in the last 168 hours. No results for input(s): AMMONIA in the last 168 hours.  CBC: Recent Labs  Lab 12/29/20 1115 01/01/21 0637 01/04/21 0416  WBC 20.2* 13.7* 10.4  HGB 11.7* 10.8* 10.3*  HCT 37.3* 34.8* 32.3*  MCV 93.5 94.1 91.0  PLT 300 246 235    Cardiac  Enzymes: No results for input(s): CKTOTAL, CKMB, CKMBINDEX, TROPONINI in the last 168 hours.  BNP (last 3 results) Recent Labs    07/03/20 1654 11/08/20 0026  BNP 118.4* 269.8*    ProBNP (last 3 results) No results for input(s): PROBNP in the last 8760 hours.  Radiological Exams: No results found.  Assessment/Plan Active Problems:   Adenocarcinoma of left lung, stage 1 (HCC)   Acute on chronic respiratory failure with hypoxia (HCC)   Atrial fibrillation (HCC)   Supraglottic stenosis   Acute on chronic respiratory failure with hypoxia plan is going to be to continue with PT.  Patient is at baseline required suctioning.  Right now is on 30% FiO2 with good saturations. Atrial fibrillation rate is controlled at this time we will continue with supportive care. Sleep apnea nonissue patient has airway in place Adenocarcinoma supportive care   I have personally seen and evaluated the patient, evaluated laboratory and imaging results, formulated the assessment and plan and placed orders. The Patient requires high complexity decision making with multiple systems involvement.  Rounds were done with the Respiratory Therapy Director and Staff therapists and discussed with nursing staff also.  Allyne Gee, MD [facility] Pulmonary Critical Care Medicine Sleep Medicine

## 2021-01-06 ENCOUNTER — Other Ambulatory Visit (HOSPITAL_COMMUNITY): Payer: Self-pay

## 2021-01-06 LAB — BLOOD GAS, ARTERIAL
Acid-Base Excess: 12.4 mmol/L — ABNORMAL HIGH (ref 0.0–2.0)
Acid-Base Excess: 12.8 mmol/L — ABNORMAL HIGH (ref 0.0–2.0)
Bicarbonate: 37.1 mmol/L — ABNORMAL HIGH (ref 20.0–28.0)
Bicarbonate: 37.8 mmol/L — ABNORMAL HIGH (ref 20.0–28.0)
FIO2: 60
FIO2: 60
O2 Saturation: 81.9 %
O2 Saturation: 83.8 %
Patient temperature: 35.8
Patient temperature: 36.3
pCO2 arterial: 51 mmHg — ABNORMAL HIGH (ref 32.0–48.0)
pCO2 arterial: 55.8 mmHg — ABNORMAL HIGH (ref 32.0–48.0)
pH, Arterial: 7.469 — ABNORMAL HIGH (ref 7.350–7.450)
pH, Arterial: 7.442 (ref 7.350–7.450)
pO2, Arterial: 42.3 mmHg — ABNORMAL LOW (ref 83.0–108.0)
pO2, Arterial: 47.5 mmHg — ABNORMAL LOW (ref 83.0–108.0)

## 2021-01-06 LAB — MAGNESIUM: Magnesium: 2.1 mg/dL (ref 1.7–2.4)

## 2021-01-06 LAB — BASIC METABOLIC PANEL
Anion gap: 6 (ref 5–15)
BUN: 33 mg/dL — ABNORMAL HIGH (ref 8–23)
CO2: 41 mmol/L — ABNORMAL HIGH (ref 22–32)
Calcium: 9.2 mg/dL (ref 8.9–10.3)
Chloride: 85 mmol/L — ABNORMAL LOW (ref 98–111)
Creatinine, Ser: 0.72 mg/dL (ref 0.61–1.24)
GFR, Estimated: >60 mL/min (ref >60)
Glucose, Bld: 121 mg/dL — ABNORMAL HIGH (ref 70–99)
Potassium: 4.7 mmol/L (ref 3.5–5.1)
Sodium: 132 mmol/L — ABNORMAL LOW (ref 135–145)

## 2021-01-07 ENCOUNTER — Other Ambulatory Visit (HOSPITAL_COMMUNITY): Payer: Self-pay

## 2021-01-07 DIAGNOSIS — I480 Paroxysmal atrial fibrillation: Secondary | ICD-10-CM | POA: Diagnosis not present

## 2021-01-07 DIAGNOSIS — J9621 Acute and chronic respiratory failure with hypoxia: Secondary | ICD-10-CM | POA: Diagnosis not present

## 2021-01-07 DIAGNOSIS — C3492 Malignant neoplasm of unspecified part of left bronchus or lung: Secondary | ICD-10-CM | POA: Diagnosis not present

## 2021-01-07 LAB — BLOOD GAS, ARTERIAL
Acid-Base Excess: 13.5 mmol/L — ABNORMAL HIGH (ref 0.0–2.0)
Bicarbonate: 39.4 mmol/L — ABNORMAL HIGH (ref 20.0–28.0)
FIO2: 40
O2 Saturation: 94.4 %
Patient temperature: 36.8
pCO2 arterial: 70.9 mmHg (ref 32.0–48.0)
pH, Arterial: 7.363 (ref 7.350–7.450)
pO2, Arterial: 72.3 mmHg — ABNORMAL LOW (ref 83.0–108.0)

## 2021-01-07 NOTE — Procedures (Signed)
Ultrasound-guided therapeutic left sided thoracentesis performed yielding 200 mililiters of straw colored fluid. No immediate complications.  . Follow-up chest x-ray pending. EBL is < 18m.

## 2021-01-07 NOTE — Progress Notes (Signed)
Pulmonary [place]  PROGRESS NOTE     Devin [name]  OZY:[id number]  DOB: 09-Nov-1940   DOA: 11/24/2020  Referring Physician: Merton Border, MD  HPI: Devin [name] is a 80 y.o. male being followed for ventilator/airway/oxygen weaning Acute on Chronic Respiratory Failure.  At this time patient is on T collar requiring 60% FiO2.  Saturations are actually 100% so I have asked for respiratory therapy to turn the oxygen down  Medications: Reviewed on Rounds  Physical Exam:  Vitals: Temperature is 97.0 pulse 78 respiratory rate is 20 blood pressure is 150/69 saturations 100%  Ventilator Settings on T collar FiO2 60%  General: Comfortable at this time Neck: supple Cardiovascular: no malignant arrhythmias Respiratory: No rhonchi very coarse breath sounds Skin: no rash seen on limited exam Musculoskeletal: No gross abnormality Psychiatric:unable to assess Neurologic:no involuntary movements         Lab Data:   Basic Metabolic Panel: Recent Labs  Lab 01/01/21 0637 01/01/21 1109 01/02/21 0448 01/04/21 0416 01/06/21 0258  NA 128* 130*  --  130* 132*  K 4.8 5.0 4.7 4.6 4.7  CL 82* 83*  --  86* 85*  CO2 42* 42*  --  37* 41*  GLUCOSE 140* 154*  --  139* 121*  BUN 38* 38*  --  31* 33*  CREATININE 0.73 0.72  --  0.71 0.72  CALCIUM 9.4 9.6  --  9.1 9.2  MG  --   --   --  2.0 2.1    ABG: Recent Labs  Lab 01/06/21 0622 01/06/21 1026 01/07/21 1305  PHART 7.469* 7.442 7.363  PCO2ART 51.0* 55.8* 70.9*  PO2ART 42.3* 47.5* 72.3*  HCO3 37.1* 37.8* 39.4*  O2SAT 81.9 83.8 94.4    Liver Function Tests: No results for input(s): AST, ALT, ALKPHOS, BILITOT, PROT, ALBUMIN in the last 168 hours. No results for input(s): LIPASE, AMYLASE in the last 168 hours. No results for input(s): AMMONIA in the last 168 hours.  CBC: Recent Labs  Lab 01/01/21 0637 01/04/21 0416  WBC 13.7* 10.4  HGB 10.8*  10.3*  HCT 34.8* 32.3*  MCV 94.1 91.0  PLT 246 235    Cardiac Enzymes: No results for input(s): CKTOTAL, CKMB, CKMBINDEX, TROPONINI in the last 168 hours.  BNP (last 3 results) Recent Labs    07/03/20 1654 11/08/20 0026  BNP 118.4* 269.8*    ProBNP (last 3 results) No results for input(s): PROBNP in the last 8760 hours.  Radiological Exams: DG Chest Port 1 View  Result Date: 01/07/2021 CLINICAL DATA:  Status post thoracentesis EXAM: PORTABLE CHEST 1 VIEW COMPARISON:  January 06, 2021 FINDINGS: No pneumothorax after left thoracentesis. The left-sided pleural effusion remains but is smaller. Small right pleural effusion with blunting of the right costophrenic angle. No change in the pedicle rods and screws in the spine or the tracheostomy tube. No other interval changes. IMPRESSION: No pneumothorax after left thoracentesis. Small effusions remain. No other changes. Electronically Signed   By: Dorise Bullion III M.D   On: 01/07/2021 10:09   DG CHEST PORT 1 VIEW  Result Date: 01/06/2021 CLINICAL DATA:  Hypoxemia. EXAM: PORTABLE CHEST 1 VIEW COMPARISON:  12/30/2018 FINDINGS: Tracheostomy tube tip is above the carina. None lung volumes are low. There is a left pleural effusion which appears increased in volume from previous exam. Associated, progressive atelectasis within the left midlung and left base noted. Mild subsegmental atelectasis in the right  base appears similar to prior exam. IMPRESSION: 1. Increase in volume of left pleural effusion and atelectasis within the left midlung and left base. 2. Stable right base subsegmental atelectasis. Electronically Signed   By: Kerby Moors M.D.   On: 01/06/2021 07:00   US THORACENTESIS ASP PLEURAL SPACE W/IMG GUIDE  Result Date: 01/07/2021 Jacqualine Mau, NP     01/07/2021  9:28 AM Ultrasound-guided therapeutic left sided thoracentesis performed yielding 200 mililiters of straw colored fluid. No immediate complications.  . Follow-up chest  x-ray pending. EBL is < 80m.       Assessment/Plan Active Problems:   Adenocarcinoma of left lung, stage 1 (HCC)   Acute on chronic respiratory failure with hypoxia (HCC)   Atrial fibrillation (HCC)   Supraglottic stenosis   Acute on chronic respiratory failure hypoxia plan is going to be to continue with the T collar titrate oxygen down. Adenocarcinoma stage: We will continue with supportive care status post resection Atrial fibrillation rate is controlled at this time Sleep apnea nonissue patient has a T collar in place   I have personally seen and evaluated the patient, evaluated laboratory and imaging results, formulated the assessment and plan and placed orders. The Patient requires high complexity decision making with multiple systems involvement.  Rounds were done with the Respiratory Therapy Director and Staff therapists and discussed with nursing staff also.  SAllyne Gee MD [facility]Chi [facility] HospitalP[facility] Sleep Medicine

## 2021-01-08 DIAGNOSIS — I480 Paroxysmal atrial fibrillation: Secondary | ICD-10-CM | POA: Diagnosis not present

## 2021-01-08 DIAGNOSIS — J9621 Acute and chronic respiratory failure with hypoxia: Secondary | ICD-10-CM | POA: Diagnosis not present

## 2021-01-08 DIAGNOSIS — C3492 Malignant neoplasm of unspecified part of left bronchus or lung: Secondary | ICD-10-CM | POA: Diagnosis not present

## 2021-01-08 LAB — CBC
HCT: 30.8 % — ABNORMAL LOW (ref 39.0–52.0)
Hemoglobin: 9.6 g/dL — ABNORMAL LOW (ref 13.0–17.0)
MCH: 29 pg (ref 26.0–34.0)
MCHC: 31.2 g/dL (ref 30.0–36.0)
MCV: 93.1 fL (ref 80.0–100.0)
Platelets: 189 10*3/uL (ref 150–400)
RBC: 3.31 MIL/uL — ABNORMAL LOW (ref 4.22–5.81)
RDW: 15.8 % — ABNORMAL HIGH (ref 11.5–15.5)
WBC: 12.4 10*3/uL — ABNORMAL HIGH (ref 4.0–10.5)
nRBC: 0 % (ref 0.0–0.2)

## 2021-01-08 LAB — BASIC METABOLIC PANEL
Anion gap: 5 (ref 5–15)
BUN: 34 mg/dL — ABNORMAL HIGH (ref 8–23)
CO2: 37 mmol/L — ABNORMAL HIGH (ref 22–32)
Calcium: 9 mg/dL (ref 8.9–10.3)
Chloride: 90 mmol/L — ABNORMAL LOW (ref 98–111)
Creatinine, Ser: 0.71 mg/dL (ref 0.61–1.24)
GFR, Estimated: >60 mL/min (ref >60)
Glucose, Bld: 180 mg/dL — ABNORMAL HIGH (ref 70–99)
Potassium: 5.1 mmol/L (ref 3.5–5.1)
Sodium: 132 mmol/L — ABNORMAL LOW (ref 135–145)

## 2021-01-08 NOTE — Progress Notes (Signed)
Pulmonary [place]  PROGRESS NOTE     KEYSHAUN EXLEY  VEH:[id number]  DOB: September 23, 1940   DOA: 11/26/2020  Referring Physician: Merton Border, MD  HPI: Devin [name] is a 80 y.o. male being followed for ventilator/airway/oxygen weaning Acute on Chronic Respiratory Failure.  He remains comfortable right now without distress has been working a little bit more with therapy.  Also was requesting to go on the BiPAP at nighttime and see about capping once again.  We have been down this road patient has done well for short time but then eventually ends up having to come back of off of capping because of CO2 retention  Medications: Reviewed on Rounds  Physical Exam:  Vitals: Temperature is 96.9 pulse 72 respiratory 21 blood pressure is 133/70 saturations 100%  Ventilator Settings on T collar with an FiO2 of 30%  General: Comfortable at this time Neck: supple Cardiovascular: no malignant arrhythmias Respiratory: No rhonchi very coarse breath sounds Skin: no rash seen on limited exam Musculoskeletal: No gross abnormality Psychiatric:unable to assess Neurologic:no involuntary movements         Lab Data:   Basic Metabolic Panel: Recent Labs  Lab 01/01/21 1109 01/02/21 0448 01/04/21 0416 01/06/21 0258 01/08/21 0313  NA 130*  --  130* 132* 132*  K 5.0 4.7 4.6 4.7 5.1  CL 83*  --  86* 85* 90*  CO2 42*  --  37* 41* 37*  GLUCOSE 154*  --  139* 121* 180*  BUN 38*  --  31* 33* 34*  CREATININE 0.72  --  0.71 0.72 0.71  CALCIUM 9.6  --  9.1 9.2 9.0  MG  --   --  2.0 2.1  --     ABG: Recent Labs  Lab 01/06/21 0622 01/06/21 1026 01/07/21 1305  PHART 7.469* 7.442 7.363  PCO2ART 51.0* 55.8* 70.9*  PO2ART 42.3* 47.5* 72.3*  HCO3 37.1* 37.8* 39.4*  O2SAT 81.9 83.8 94.4    Liver Function Tests: No results for input(s): AST, ALT, ALKPHOS, BILITOT, PROT, ALBUMIN in the last 168 hours. No results  for input(s): LIPASE, AMYLASE in the last 168 hours. No results for input(s): AMMONIA in the last 168 hours.  CBC: Recent Labs  Lab 01/04/21 0416 01/08/21 0313  WBC 10.4 12.4*  HGB 10.3* 9.6*  HCT 32.3* 30.8*  MCV 91.0 93.1  PLT 235 189    Cardiac Enzymes: No results for input(s): CKTOTAL, CKMB, CKMBINDEX, TROPONINI in the last 168 hours.  BNP (last 3 results) Recent Labs    07/03/20 1654 11/08/20 0026  BNP 118.4* 269.8*    ProBNP (last 3 results) No results for input(s): PROBNP in the last 8760 hours.  Radiological Exams: DG Chest Port 1 View  Result Date: 01/07/2021 CLINICAL DATA:  Status post thoracentesis EXAM: PORTABLE CHEST 1 VIEW COMPARISON:  January 06, 2021 FINDINGS: No pneumothorax after left thoracentesis. The left-sided pleural effusion remains but is smaller. Small right pleural effusion with blunting of the right costophrenic angle. No change in the pedicle rods and screws in the spine or the tracheostomy tube. No other interval changes. IMPRESSION: No pneumothorax after left thoracentesis. Small effusions remain. No other changes. Electronically Signed   By: Dorise Bullion III M.D   On: 01/07/2021 10:09   US THORACENTESIS ASP PLEURAL SPACE W/IMG GUIDE  Result Date: 01/07/2021 INDICATION: Patient with history of acute on chronic respiratory failure. Found to have a pleural effusion. Request  is for therapeutic thoracentesis. EXAM: ULTRASOUND GUIDED THERAPEUTIC THORACENTESIS MEDICATIONS: Lidocaine 1% 10 mL COMPLICATIONS: None immediate. PROCEDURE: An ultrasound guided thoracentesis was thoroughly discussed with the patient and questions answered. The benefits, risks, alternatives and complications were also discussed. The patient understands and wishes to proceed with the procedure. Written consent was obtained. Ultrasound was performed to localize and mark an adequate pocket of fluid in the left chest. The area was then prepped and draped in the normal sterile fashion.  1% Lidocaine was used for local anesthesia. Under ultrasound guidance a 19 gauge, 7-cm, Yueh catheter was introduced. Thoracentesis was performed. The catheter was removed and a dressing applied. FINDINGS: A total of approximately 200 mL of straw-colored fluid was removed. IMPRESSION: Successful ultrasound guided therapeutic left-sided thoracentesis yielding 200 mL of pleural fluid. Read by: Rushie Nyhan, NP Electronically Signed   By: Jerilynn Mages.  Shick M.D.   On: 01/07/2021 10:16    Assessment/Plan Active Problems:   Adenocarcinoma of left lung, stage 1 (HCC)   Acute on chronic respiratory failure with hypoxia (HCC)   Atrial fibrillation (HCC)   Supraglottic stenosis   Acute on chronic respiratory failure with hypoxia patient underwent thoracentesis yesterday seems to be doing a little bit better.  Discussed the situation during rounds regarding possibility of trying capping once again.  Think we need to have another family meeting Case management is going to look into the case further Atrial fibrillation right now is rate controlled at this time plan is going to be to continue to monitor along closely. Adenocarcinoma stage I supportive care Sleep apnea nonissue patient has tracheostomy in place   I have personally seen and evaluated the patient, evaluated laboratory and imaging results, formulated the assessment and plan and placed orders. The Patient requires high complexity decision making with multiple systems involvement.  Rounds were done with the Respiratory Therapy Director and Staff therapists and discussed with nursing staff also.  Allyne Gee, MD [facility] Pulmonary Critical Care Medicine Sleep Medicine

## 2021-01-09 DIAGNOSIS — I480 Paroxysmal atrial fibrillation: Secondary | ICD-10-CM | POA: Diagnosis not present

## 2021-01-09 DIAGNOSIS — C3492 Malignant neoplasm of unspecified part of left bronchus or lung: Secondary | ICD-10-CM | POA: Diagnosis not present

## 2021-01-09 DIAGNOSIS — J9621 Acute and chronic respiratory failure with hypoxia: Secondary | ICD-10-CM | POA: Diagnosis not present

## 2021-01-09 NOTE — Progress Notes (Signed)
Pulmonary [place]  PROGRESS NOTE     Devin [name]  [id number]:[id number]  DOB: [birth year]   DOA: 12/03/2020  Referring Physician: Merton Border, MD  HPI: Devin [name] is a 80 y.o. male being followed for ventilator/airway/oxygen weaning Acute on Chronic Respiratory Failure.  He is on T collar comfortable awake without any distress has been on 30% FiO2 using the PMV  Medications: Reviewed on Rounds  Physical Exam:  Vitals: Temperature is 97.4 pulse 74 respiratory rate is 20 blood pressure is 132/88 saturations 100%  Ventilator Settings on T collar FiO2 30%  General: Comfortable at this time Neck: supple Cardiovascular: no malignant arrhythmias Respiratory: No rhonchi very coarse breath sounds Skin: no rash seen on limited exam Musculoskeletal: No gross abnormality Psychiatric:unable to assess Neurologic:no involuntary movements         Lab Data:   Basic Metabolic Panel: Recent Labs  Lab 01/04/21 0416 01/06/21 0258 01/08/21 0313  NA 130* 132* 132*  K 4.6 4.7 5.1  CL 86* 85* 90*  CO2 37* 41* 37*  GLUCOSE 139* 121* 180*  BUN 31* 33* 34*  CREATININE 0.71 0.72 0.71  CALCIUM 9.1 9.2 9.0  MG 2.0 2.1  --     ABG: Recent Labs  Lab 01/06/21 0622 01/06/21 1026 01/07/21 1305  PHART 7.469* 7.442 7.363  PCO2ART 51.0* 55.8* 70.9*  PO2ART 42.3* 47.5* 72.3*  HCO3 37.1* 37.8* 39.4*  O2SAT 81.9 83.8 94.4    Liver Function Tests: No results for input(s): AST, ALT, ALKPHOS, BILITOT, PROT, ALBUMIN in the last 168 hours. No results for input(s): LIPASE, AMYLASE in the last 168 hours. No results for input(s): AMMONIA in the last 168 hours.  CBC: Recent Labs  Lab 01/04/21 0416 01/08/21 0313  WBC 10.4 12.4*  HGB 10.3* 9.6*  HCT 32.3* 30.8*  MCV 91.0 93.1  PLT 235 189    Cardiac Enzymes: No results for input(s): CKTOTAL, CKMB, CKMBINDEX, TROPONINI in the last 168 hours.  BNP  (last 3 results) Recent Labs    07/03/20 1654 11/08/20 0026  BNP 118.4* 269.8*    ProBNP (last 3 results) No results for input(s): PROBNP in the last 8760 hours.  Radiological Exams: DG Chest Port 1 View  Result Date: 01/07/2021 CLINICAL DATA:  Status post thoracentesis EXAM: PORTABLE CHEST 1 VIEW COMPARISON:  January 06, 2021 FINDINGS: No pneumothorax after left thoracentesis. The left-sided pleural effusion remains but is smaller. Small right pleural effusion with blunting of the right costophrenic angle. No change in the pedicle rods and screws in the spine or the tracheostomy tube. No other interval changes. IMPRESSION: No pneumothorax after left thoracentesis. Small effusions remain. No other changes. Electronically Signed   By: Dorise Bullion III M.D   On: 01/07/2021 10:09   US THORACENTESIS ASP PLEURAL SPACE W/IMG GUIDE  Result Date: 01/07/2021 INDICATION: Patient with history of acute on chronic respiratory failure. Found to have a pleural effusion. Request is for therapeutic thoracentesis. EXAM: ULTRASOUND GUIDED THERAPEUTIC THORACENTESIS MEDICATIONS: Lidocaine 1% 10 mL COMPLICATIONS: None immediate. PROCEDURE: An ultrasound guided thoracentesis was thoroughly discussed with the patient and questions answered. The benefits, risks, alternatives and complications were also discussed. The patient understands and wishes to proceed with the procedure. Written consent was obtained. Ultrasound was performed to localize and mark an adequate pocket of fluid in the left chest. The area was then prepped and draped in the normal sterile fashion. 1% Lidocaine was used for  local anesthesia. Under ultrasound guidance a 19 gauge, 7-cm, Yueh catheter was introduced. Thoracentesis was performed. The catheter was removed and a dressing applied. FINDINGS: A total of approximately 200 mL of straw-colored fluid was removed. IMPRESSION: Successful ultrasound guided therapeutic left-sided thoracentesis yielding 200  mL of pleural fluid. Read by: Rushie Nyhan, NP Electronically Signed   By: Jerilynn Mages.  Shick M.D.   On: 01/07/2021 10:16    Assessment/Plan Active Problems:   Adenocarcinoma of left lung, stage 1 (HCC)   Acute on chronic respiratory failure with hypoxia (HCC)   Atrial fibrillation (HCC)   Supraglottic stenosis   Acute on chronic respiratory failure with hypoxia we will continue with T-piece right now patient's been on 30% FiO2 good saturations are noted. Chronic atrial fibrillation rate now rate is controlled we will continue with supportive care Adenocarcinoma status postresection   I have personally seen and evaluated the patient, evaluated laboratory and imaging results, formulated the assessment and plan and placed orders. The Patient requires high complexity decision making with multiple systems involvement.  Rounds were done with the Respiratory Therapy Director and Staff therapists and discussed with nursing staff also.  Allyne Gee, MD [facility] Pulmonary Critical Care Medicine Sleep Medicine

## 2021-01-10 DIAGNOSIS — I480 Paroxysmal atrial fibrillation: Secondary | ICD-10-CM | POA: Diagnosis not present

## 2021-01-10 DIAGNOSIS — C3492 Malignant neoplasm of unspecified part of left bronchus or lung: Secondary | ICD-10-CM | POA: Diagnosis not present

## 2021-01-10 DIAGNOSIS — J9621 Acute and chronic respiratory failure with hypoxia: Secondary | ICD-10-CM | POA: Diagnosis not present

## 2021-01-10 NOTE — Progress Notes (Signed)
Pulmonary [place]  PROGRESS NOTE     Devin [name]  [id number]  DOB: 07-20-1940   DOA: 11/28/2020  Referring Physician: Merton Border, MD  HPI: Devin [name] is a 80 y.o. male being followed for ventilator/airway/oxygen weaning Acute on Chronic Respiratory Failure.  Patient is comfortable right now without distress at this time has been on T collar on 20% FiO2.  Patient started using the BiPAP again at nighttime at his request.  Reportedly he did well with compliance overnight  Medications: Reviewed on Rounds  Physical Exam:  Vitals: Temperature is 97.1 pulse 71 respiratory 18 blood pressure is 106/50 saturations 99%  Ventilator Settings off the ventilator on T collar currently on 28% FiO2  General: Comfortable at this time Neck: supple Cardiovascular: no malignant arrhythmias Respiratory: No rhonchi very coarse breath sounds are noted at this time. Skin: no rash seen on limited exam Musculoskeletal: No gross abnormality Psychiatric:unable to assess Neurologic:no involuntary movements         Lab Data:   Basic Metabolic Panel: Recent Labs  Lab 01/04/21 0416 01/06/21 0258 01/08/21 0313  NA 130* 132* 132*  K 4.6 4.7 5.1  CL 86* 85* 90*  CO2 37* 41* 37*  GLUCOSE 139* 121* 180*  BUN 31* 33* 34*  CREATININE 0.71 0.72 0.71  CALCIUM 9.1 9.2 9.0  MG 2.0 2.1  --     ABG: Recent Labs  Lab 01/06/21 0622 01/06/21 1026 01/07/21 1305  PHART 7.469* 7.442 7.363  PCO2ART 51.0* 55.8* 70.9*  PO2ART 42.3* 47.5* 72.3*  HCO3 37.1* 37.8* 39.4*  O2SAT 81.9 83.8 94.4    Liver Function Tests: No results for input(s): AST, ALT, ALKPHOS, BILITOT, PROT, ALBUMIN in the last 168 hours. No results for input(s): LIPASE, AMYLASE in the last 168 hours. No results for input(s): AMMONIA in the last 168 hours.  CBC: Recent Labs  Lab 01/04/21 0416 01/08/21 0313  WBC 10.4 12.4*  HGB 10.3*  9.6*  HCT 32.3* 30.8*  MCV 91.0 93.1  PLT 235 189    Cardiac Enzymes: No results for input(s): CKTOTAL, CKMB, CKMBINDEX, TROPONINI in the last 168 hours.  BNP (last 3 results) Recent Labs    07/03/20 1654 11/08/20 0026  BNP 118.4* 269.8*    ProBNP (last 3 results) No results for input(s): PROBNP in the last 8760 hours.  Radiological Exams: No results found.  Assessment/Plan Active Problems:   Adenocarcinoma of left lung, stage 1 (HCC)   Acute on chronic respiratory failure with hypoxia (HCC)   Atrial fibrillation (HCC)   Supraglottic stenosis   Acute on chronic respiratory failure with hypoxia currently is on T collar has been on 28% FiO2 using the PMV during the daytime and then going on capping at nighttime with the BiPAP per patient request Atrial fibrillation right now rate is controlled Obstructive sleep apnea using BiPAP at nighttime Adenocarcinoma status postresection   I have personally seen and evaluated the patient, evaluated laboratory and imaging results, formulated the assessment and plan and placed orders. The Patient requires high complexity decision making with multiple systems involvement.  Rounds were done with the Respiratory Therapy Director and Staff therapists and discussed with nursing staff also.  Allyne Gee, MD [facility] Pulmonary Critical Care Medicine Sleep Medicine

## 2021-01-11 DIAGNOSIS — I480 Paroxysmal atrial fibrillation: Secondary | ICD-10-CM | POA: Diagnosis not present

## 2021-01-11 DIAGNOSIS — C3492 Malignant neoplasm of unspecified part of left bronchus or lung: Secondary | ICD-10-CM | POA: Diagnosis not present

## 2021-01-11 DIAGNOSIS — J9621 Acute and chronic respiratory failure with hypoxia: Secondary | ICD-10-CM | POA: Diagnosis not present

## 2021-01-11 NOTE — Progress Notes (Signed)
Pulmonary [place]  PROGRESS NOTE     Devin [name]  [id number]:[id number]  DOB: August 06, 1940   DOA: 11/16/2020  Referring Physician: Merton Border, MD  HPI: Devin [name] is a 80 y.o. male being followed for ventilator/airway/oxygen weaning Acute on Chronic Respiratory Failure.  Patient currently is on T collar wore the BiPAP last night.  The respiratory therapist reports still getting copious secretions had some mucous plug requiring suctioning and lavaging  Medications: Reviewed on Rounds  Physical Exam:  Vitals: Temperature is 96.2 pulse 64 respiratory rate is 30 blood pressure is 147/72 saturations 100%  Ventilator Settings off the ventilator right now on T collar  General: Comfortable at this time Neck: supple Cardiovascular: no malignant arrhythmias Respiratory: No rhonchi very coarse breath sounds Skin: no rash seen on limited exam Musculoskeletal: No gross abnormality Psychiatric:unable to assess Neurologic:no involuntary movements         Lab Data:   Basic Metabolic Panel: Recent Labs  Lab 01/06/21 0258 01/08/21 0313  NA 132* 132*  K 4.7 5.1  CL 85* 90*  CO2 41* 37*  GLUCOSE 121* 180*  BUN 33* 34*  CREATININE 0.72 0.71  CALCIUM 9.2 9.0  MG 2.1  --     ABG: Recent Labs  Lab 01/06/21 0622 01/06/21 1026 01/07/21 1305  PHART 7.469* 7.442 7.363  PCO2ART 51.0* 55.8* 70.9*  PO2ART 42.3* 47.5* 72.3*  HCO3 37.1* 37.8* 39.4*  O2SAT 81.9 83.8 94.4    Liver Function Tests: No results for input(s): AST, ALT, ALKPHOS, BILITOT, PROT, ALBUMIN in the last 168 hours. No results for input(s): LIPASE, AMYLASE in the last 168 hours. No results for input(s): AMMONIA in the last 168 hours.  CBC: Recent Labs  Lab 01/08/21 0313  WBC 12.4*  HGB 9.6*  HCT 30.8*  MCV 93.1  PLT 189    Cardiac Enzymes: No results for input(s): CKTOTAL, CKMB, CKMBINDEX, TROPONINI in the last 168  hours.  BNP (last 3 results) Recent Labs    07/03/20 1654 11/08/20 0026  BNP 118.4* 269.8*    ProBNP (last 3 results) No results for input(s): PROBNP in the last 8760 hours.  Radiological Exams: No results found.  Assessment/Plan Active Problems:   Adenocarcinoma of left lung, stage 1 (HCC)   Acute on chronic respiratory failure with hypoxia (HCC)   Atrial fibrillation (HCC)   Supraglottic stenosis   Acute on chronic respiratory failure with hypoxia at this time patient is continuing on the T collar titrate oxygen as tolerated continue secretion management supportive care. Adenocarcinoma of the left lung status post resection supportive care Obstructive sleep apnea continue with the BiPAP at nighttime may benefit from some humidity Atrial fibrillation right now rate is controlled continue with supportive care   I have personally seen and evaluated the patient, evaluated laboratory and imaging results, formulated the assessment and plan and placed orders. The Patient requires high complexity decision making with multiple systems involvement.  Rounds were done with the Respiratory Therapy Director and Staff therapists and discussed with nursing staff also.  Allyne Gee, MD [facility] Pulmonary Critical Care Medicine Sleep Medicine

## 2021-01-12 DIAGNOSIS — I480 Paroxysmal atrial fibrillation: Secondary | ICD-10-CM | POA: Diagnosis not present

## 2021-01-12 DIAGNOSIS — J9621 Acute and chronic respiratory failure with hypoxia: Secondary | ICD-10-CM | POA: Diagnosis not present

## 2021-01-12 DIAGNOSIS — C3492 Malignant neoplasm of unspecified part of left bronchus or lung: Secondary | ICD-10-CM | POA: Diagnosis not present

## 2021-01-12 LAB — CBC
HCT: 35.5 % — ABNORMAL LOW (ref 39.0–52.0)
Hemoglobin: 11.5 g/dL — ABNORMAL LOW (ref 13.0–17.0)
MCH: 29.7 pg (ref 26.0–34.0)
MCHC: 32.4 g/dL (ref 30.0–36.0)
MCV: 91.7 fL (ref 80.0–100.0)
Platelets: 232 10*3/uL (ref 150–400)
RBC: 3.87 MIL/uL — ABNORMAL LOW (ref 4.22–5.81)
RDW: 15.9 % — ABNORMAL HIGH (ref 11.5–15.5)
WBC: 9.7 10*3/uL (ref 4.0–10.5)
nRBC: 0 % (ref 0.0–0.2)

## 2021-01-12 LAB — BASIC METABOLIC PANEL
Anion gap: 4 — ABNORMAL LOW (ref 5–15)
BUN: 26 mg/dL — ABNORMAL HIGH (ref 8–23)
CO2: 42 mmol/L — ABNORMAL HIGH (ref 22–32)
Calcium: 9 mg/dL (ref 8.9–10.3)
Chloride: 87 mmol/L — ABNORMAL LOW (ref 98–111)
Creatinine, Ser: 0.79 mg/dL (ref 0.61–1.24)
GFR, Estimated: >60 mL/min (ref >60)
Glucose, Bld: 148 mg/dL — ABNORMAL HIGH (ref 70–99)
Potassium: 5.2 mmol/L — ABNORMAL HIGH (ref 3.5–5.1)
Sodium: 133 mmol/L — ABNORMAL LOW (ref 135–145)

## 2021-01-12 NOTE — Progress Notes (Signed)
Pulmonary [place]  PROGRESS NOTE     Devin [name]  [id number]:[id number]  DOB: Oct 28, 1940   DOA: 11/25/2020  Referring Physician: Merton Border, MD  HPI: Devin [name] is a 80 y.o. male being followed for ventilator/airway/oxygen weaning Acute on Chronic Respiratory Failure.  Patient is afebrile right now comfortable without distress at this time has been on T collar remains on 28% FiO2 using the BiPAP at night  Medications: Reviewed on Rounds  Physical Exam:  Vitals: Temperature is 96.6 pulse 71 respiratory rate is 27 blood pressure 134/63 saturations 98%  Ventilator Settings patient currently is on BiPAP using PMV during the daytime resting on the BiPAP at night  General: Comfortable at this time Neck: supple Cardiovascular: no malignant arrhythmias Respiratory: No rhonchi no rales are noted at this time Skin: no rash seen on limited exam Musculoskeletal: No gross abnormality Psychiatric:unable to assess Neurologic:no involuntary movements         Lab Data:   Basic Metabolic Panel: Recent Labs  Lab 01/06/21 0258 01/08/21 0313 01/12/21 0419  NA 132* 132* 133*  K 4.7 5.1 5.2*  CL 85* 90* 87*  CO2 41* 37* 42*  GLUCOSE 121* 180* 148*  BUN 33* 34* 26*  CREATININE 0.72 0.71 0.79  CALCIUM 9.2 9.0 9.0  MG 2.1  --   --     ABG: Recent Labs  Lab 01/06/21 0622 01/06/21 1026 01/07/21 1305  PHART 7.469* 7.442 7.363  PCO2ART 51.0* 55.8* 70.9*  PO2ART 42.3* 47.5* 72.3*  HCO3 37.1* 37.8* 39.4*  O2SAT 81.9 83.8 94.4    Liver Function Tests: No results for input(s): AST, ALT, ALKPHOS, BILITOT, PROT, ALBUMIN in the last 168 hours. No results for input(s): LIPASE, AMYLASE in the last 168 hours. No results for input(s): AMMONIA in the last 168 hours.  CBC: Recent Labs  Lab 01/08/21 0313 01/12/21 0419  WBC 12.4* 9.7  HGB 9.6* 11.5*  HCT 30.8* 35.5*  MCV 93.1 91.7  PLT 189 232     Cardiac Enzymes: No results for input(s): CKTOTAL, CKMB, CKMBINDEX, TROPONINI in the last 168 hours.  BNP (last 3 results) Recent Labs    07/03/20 1654 11/08/20 0026  BNP 118.4* 269.8*    ProBNP (last 3 results) No results for input(s): PROBNP in the last 8760 hours.  Radiological Exams: No results found.  Assessment/Plan Active Problems:   Adenocarcinoma of left lung, stage 1 (HCC)   Acute on chronic respiratory failure with hypoxia (HCC)   Atrial fibrillation (HCC)   Supraglottic stenosis   Acute on chronic respiratory failure with hypoxia patient continues to do well with BiPAP and is being more compliant plan is going to be to continue with current management Adenocarcinoma of the lung supportive care status post resection Sleep apnea continue with BiPAP therapy Chronic atrial fibrillation rate is controlled   I have personally seen and evaluated the patient, evaluated laboratory and imaging results, formulated the assessment and plan and placed orders. The Patient requires high complexity decision making with multiple systems involvement.  Rounds were done with the Respiratory Therapy Director and Staff therapists and discussed with nursing staff also.  Allyne Gee, MD [facility] Pulmonary Critical Care Medicine Sleep Medicine

## 2021-01-13 LAB — POTASSIUM: Potassium: 4.1 mmol/L (ref 3.5–5.1)

## 2021-01-14 DIAGNOSIS — J9621 Acute and chronic respiratory failure with hypoxia: Secondary | ICD-10-CM | POA: Diagnosis not present

## 2021-01-14 DIAGNOSIS — C3492 Malignant neoplasm of unspecified part of left bronchus or lung: Secondary | ICD-10-CM | POA: Diagnosis not present

## 2021-01-14 DIAGNOSIS — I480 Paroxysmal atrial fibrillation: Secondary | ICD-10-CM | POA: Diagnosis not present

## 2021-01-14 NOTE — Progress Notes (Signed)
Pulmonary [place]  PROGRESS NOTE     Devin [name]  [id number]  DOB: [birth year]   DOA: 11/13/2020  Referring Physician: Merton Border, MD  HPI: Devin [name] is a 79 y.o. male being followed for ventilator/airway/oxygen weaning Acute on Chronic Respiratory Failure.  Patient is resting comfortably right now without distress has been on T collar currently on 20% FiO2 using the PMV  Medications: Reviewed on Rounds  Physical Exam:  Vitals: Temperature is 97.3 pulse 75 respiratory 22 blood pressure is 150/76 saturations 100%  Ventilator Settings currently is off the ventilator on T collar FiO2 28%  General: Comfortable at this time Neck: supple Cardiovascular: no malignant arrhythmias Respiratory: No rhonchi coarse breath sounds Skin: no rash seen on limited exam Musculoskeletal: No gross abnormality Psychiatric:unable to assess Neurologic:no involuntary movements         Lab Data:   Basic Metabolic Panel: Recent Labs  Lab 01/08/21 0313 01/12/21 0419 01/13/21 0618  NA 132* 133*  --   K 5.1 5.2* 4.1  CL 90* 87*  --   CO2 37* 42*  --   GLUCOSE 180* 148*  --   BUN 34* 26*  --   CREATININE 0.71 0.79  --   CALCIUM 9.0 9.0  --     ABG: Recent Labs  Lab 01/07/21 1305  PHART 7.363  PCO2ART 70.9*  PO2ART 72.3*  HCO3 39.4*  O2SAT 94.4    Liver Function Tests: No results for input(s): AST, ALT, ALKPHOS, BILITOT, PROT, ALBUMIN in the last 168 hours. No results for input(s): LIPASE, AMYLASE in the last 168 hours. No results for input(s): AMMONIA in the last 168 hours.  CBC: Recent Labs  Lab 01/08/21 0313 01/12/21 0419  WBC 12.4* 9.7  HGB 9.6* 11.5*  HCT 30.8* 35.5*  MCV 93.1 91.7  PLT 189 232    Cardiac Enzymes: No results for input(s): CKTOTAL, CKMB, CKMBINDEX, TROPONINI in the last 168 hours.  BNP (last 3 results) Recent Labs    07/03/20 1654 11/08/20 0026   BNP 118.4* 269.8*    ProBNP (last 3 results) No results for input(s): PROBNP in the last 8760 hours.  Radiological Exams: No results found.  Assessment/Plan Active Problems:   Adenocarcinoma of left lung, stage 1 (HCC)   Acute on chronic respiratory failure with hypoxia (HCC)   Atrial fibrillation (HCC)   Supraglottic stenosis   Acute on chronic respiratory failure with hypoxia patient apparently last night did not use the BiPAP again needs to encourage compliance Adenocarcinoma status postresection supportive care Atrial fibrillation rate now rate is controlled Obstructive sleep apnea encourage compliance with CPAP therapy   I have personally seen and evaluated the patient, evaluated laboratory and imaging results, formulated the assessment and plan and placed orders. The Patient requires high complexity decision making with multiple systems involvement.  Rounds were done with the Respiratory Therapy Director and Staff therapists and discussed with nursing staff also.  Allyne Gee, MD [facility] Pulmonary Critical Care Medicine Sleep Medicine

## 2021-01-15 DIAGNOSIS — J9621 Acute and chronic respiratory failure with hypoxia: Secondary | ICD-10-CM | POA: Diagnosis not present

## 2021-01-15 DIAGNOSIS — C3492 Malignant neoplasm of unspecified part of left bronchus or lung: Secondary | ICD-10-CM | POA: Diagnosis not present

## 2021-01-15 DIAGNOSIS — I480 Paroxysmal atrial fibrillation: Secondary | ICD-10-CM | POA: Diagnosis not present

## 2021-01-15 NOTE — Progress Notes (Signed)
Pulmonary [place]  PROGRESS NOTE     Devin [name]  [id number]  DOB: [birth year]   DOA: 12/02/2020  Referring Physician: Merton Border, MD  HPI: Devin [name] is a 80 y.o. male being followed for ventilator/airway/oxygen weaning Acute on Chronic Respiratory Failure.  Patient is resting comfortably has been on T collar on 20% FiO2  Medications: Reviewed on Rounds  Physical Exam:  Vitals: Temperature is 97.9 pulse 67 respiratory 20 blood pressure is 165/73 saturations 100%  Ventilator Settings of the ventilator on T collar FiO2 28%  General: Comfortable at this time Neck: supple Cardiovascular: no malignant arrhythmias Respiratory: Scattered rhonchi expansion is equal Skin: no rash seen on limited exam Musculoskeletal: No gross abnormality Psychiatric:unable to assess Neurologic:no involuntary movements         Lab Data:   Basic Metabolic Panel: Recent Labs  Lab 01/12/21 0419 01/13/21 0618  NA 133*  --   K 5.2* 4.1  CL 87*  --   CO2 42*  --   GLUCOSE 148*  --   BUN 26*  --   CREATININE 0.79  --   CALCIUM 9.0  --     ABG: No results for input(s): PHART, PCO2ART, PO2ART, HCO3, O2SAT in the last 168 hours.  Liver Function Tests: No results for input(s): AST, ALT, ALKPHOS, BILITOT, PROT, ALBUMIN in the last 168 hours. No results for input(s): LIPASE, AMYLASE in the last 168 hours. No results for input(s): AMMONIA in the last 168 hours.  CBC: Recent Labs  Lab 01/12/21 0419  WBC 9.7  HGB 11.5*  HCT 35.5*  MCV 91.7  PLT 232    Cardiac Enzymes: No results for input(s): CKTOTAL, CKMB, CKMBINDEX, TROPONINI in the last 168 hours.  BNP (last 3 results) Recent Labs    07/03/20 1654 11/08/20 0026  BNP 118.4* 269.8*    ProBNP (last 3 results) No results for input(s): PROBNP in the last 8760 hours.  Radiological Exams: No results found.  Assessment/Plan Active  Problems:   Adenocarcinoma of left lung, stage 1 (HCC)   Acute on chronic respiratory failure with hypoxia (HCC)   Atrial fibrillation (HCC)   Supraglottic stenosis   Acute on chronic respiratory failure with hypoxia we will continue OT collar titrate oxygen as tolerated continue pulmonary toilet. Adenocarcinoma stage I supportive care Chronic atrial fibrillation rate is controlled Sleep apnea encourage compliance with BiPAP   I have personally seen and evaluated the patient, evaluated laboratory and imaging results, formulated the assessment and plan and placed orders. The Patient requires high complexity decision making with multiple systems involvement.  Rounds were done with the Respiratory Therapy Director and Staff therapists and discussed with nursing staff also.  Allyne Gee, MD [facility] Pulmonary Critical Care Medicine Sleep Medicine

## 2021-01-16 DIAGNOSIS — I480 Paroxysmal atrial fibrillation: Secondary | ICD-10-CM | POA: Diagnosis not present

## 2021-01-16 DIAGNOSIS — J9621 Acute and chronic respiratory failure with hypoxia: Secondary | ICD-10-CM | POA: Diagnosis not present

## 2021-01-16 DIAGNOSIS — C3492 Malignant neoplasm of unspecified part of left bronchus or lung: Secondary | ICD-10-CM | POA: Diagnosis not present

## 2021-01-16 LAB — CBC
HCT: 37 % — ABNORMAL LOW (ref 39.0–52.0)
Hemoglobin: 11.7 g/dL — ABNORMAL LOW (ref 13.0–17.0)
MCH: 29.4 pg (ref 26.0–34.0)
MCHC: 31.6 g/dL (ref 30.0–36.0)
MCV: 93 fL (ref 80.0–100.0)
Platelets: 197 10*3/uL (ref 150–400)
RBC: 3.98 MIL/uL — ABNORMAL LOW (ref 4.22–5.81)
RDW: 15.9 % — ABNORMAL HIGH (ref 11.5–15.5)
WBC: 11.2 10*3/uL — ABNORMAL HIGH (ref 4.0–10.5)
nRBC: 0 % (ref 0.0–0.2)

## 2021-01-16 LAB — BASIC METABOLIC PANEL
Anion gap: 5 (ref 5–15)
BUN: 29 mg/dL — ABNORMAL HIGH (ref 8–23)
CO2: 38 mmol/L — ABNORMAL HIGH (ref 22–32)
Calcium: 9.1 mg/dL (ref 8.9–10.3)
Chloride: 91 mmol/L — ABNORMAL LOW (ref 98–111)
Creatinine, Ser: 0.74 mg/dL (ref 0.61–1.24)
GFR, Estimated: >60 mL/min (ref >60)
Glucose, Bld: 169 mg/dL — ABNORMAL HIGH (ref 70–99)
Potassium: 4.8 mmol/L (ref 3.5–5.1)
Sodium: 134 mmol/L — ABNORMAL LOW (ref 135–145)

## 2021-01-16 NOTE — Progress Notes (Signed)
Pulmonary [name]  PROGRESS NOTE     Devin [name]  [id number]  DOB: Mar 03, 1941   DOA: 12/01/2020  Referring Physician: Merton Border, MD  HPI: Devin [name] is a 80 y.o. male being followed for ventilator/airway/oxygen weaning Acute on Chronic Respiratory Failure.  Patient is comfortable right now without distress at this time has been on the T collar.  Apparently last night patient took off his BiPAP rapid response occurred.  Patient was then bagged and then placed back on the T collar.  Right now he is on T collar doing better  Medications: Reviewed on Rounds  Physical Exam:  Vitals: Temperature is 97.8 pulse 83 respiratory 35 blood pressure is 121/71 saturations 99%  Ventilator Settings off the ventilator and currently on T collar  General: Comfortable at this time Neck: supple Cardiovascular: no malignant arrhythmias Respiratory: Scattered rhonchi expansion is equal Skin: no rash seen on limited exam Musculoskeletal: No gross abnormality Psychiatric:unable to assess Neurologic:no involuntary movements         Lab Data:   Basic Metabolic Panel: Recent Labs  Lab 01/12/21 0419 01/13/21 0618 01/16/21 0321  NA 133*  --  134*  K 5.2* 4.1 4.8  CL 87*  --  91*  CO2 42*  --  38*  GLUCOSE 148*  --  169*  BUN 26*  --  29*  CREATININE 0.79  --  0.74  CALCIUM 9.0  --  9.1    ABG: No results for input(s): PHART, PCO2ART, PO2ART, HCO3, O2SAT in the last 168 hours.  Liver Function Tests: No results for input(s): AST, ALT, ALKPHOS, BILITOT, PROT, ALBUMIN in the last 168 hours. No results for input(s): LIPASE, AMYLASE in the last 168 hours. No results for input(s): AMMONIA in the last 168 hours.  CBC: Recent Labs  Lab 01/12/21 0419 01/16/21 0321  WBC 9.7 11.2*  HGB 11.5* 11.7*  HCT 35.5* 37.0*  MCV 91.7 93.0  PLT 232 197    Cardiac Enzymes: No results for input(s):  CKTOTAL, CKMB, CKMBINDEX, TROPONINI in the last 168 hours.  BNP (last 3 results) Recent Labs    07/03/20 1654 11/08/20 0026  BNP 118.4* 269.8*    ProBNP (last 3 results) No results for input(s): PROBNP in the last 8760 hours.  Radiological Exams: No results found.  Assessment/Plan Active Problems:   Adenocarcinoma of left lung, stage 1 (HCC)   Acute on chronic respiratory failure with hypoxia (HCC)   Atrial fibrillation (HCC)   Supraglottic stenosis   Acute on chronic respiratory failure hypoxia patient had rapid response yesterday last night because he had taken the BiPAP off while he was capping.  I think that we need to abandon this idea of using the BiPAP while he is capping at nighttime as patient has been consistently now taking it off and puts himself in danger as what happened last night Adenocarcinoma of the lung supportive care we will continue to monitor closely Atrial fibrillation right now rate is controlled Obstructive sleep apnea patient is noncompliant with BiPAP need to probably leave him on the T collar   I have personally seen and evaluated the patient, evaluated laboratory and imaging results, formulated the assessment and plan and placed orders. The Patient requires high complexity decision making with multiple systems involvement.  Rounds were done with the Respiratory Therapy Director and Staff therapists and discussed with nursing staff also.  Allyne Gee, MD [facility] Pulmonary Critical  Care Medicine Sleep Medicine

## 2021-01-17 ENCOUNTER — Other Ambulatory Visit (HOSPITAL_COMMUNITY): Payer: Self-pay

## 2021-01-17 DIAGNOSIS — C3492 Malignant neoplasm of unspecified part of left bronchus or lung: Secondary | ICD-10-CM | POA: Diagnosis not present

## 2021-01-17 DIAGNOSIS — J9621 Acute and chronic respiratory failure with hypoxia: Secondary | ICD-10-CM | POA: Diagnosis not present

## 2021-01-17 DIAGNOSIS — I480 Paroxysmal atrial fibrillation: Secondary | ICD-10-CM | POA: Diagnosis not present

## 2021-01-17 NOTE — Progress Notes (Signed)
Pulmonary [place]  PROGRESS NOTE     Devin [name]  PVV:[id number]  DOB: April 13, 1941   DOA: 12/01/2020  Referring Physician: Merton Border, MD  HPI: Devin [name] is a 80 y.o. male being followed for ventilator/airway/oxygen weaning Acute on Chronic Respiratory Failure.  Patient once again pulled off his BiPAP overnight several times.  Had desaturations noted.  At this point patient needs to remain on the T collar at nighttime and can do PMV during the daytime which the patient is able to tolerate.  Medications: Reviewed on Rounds  Physical Exam:  Vitals: Temperature 96.9 pulse 77 respiratory is 30 blood pressures 10 32/66 saturations 95%  Ventilator Settings off ventilator right now on T collar  General: Comfortable at this time Neck: supple Cardiovascular: no malignant arrhythmias Respiratory: No rhonchi no rales are noted at this time. Skin: no rash seen on limited exam Musculoskeletal: No gross abnormality Psychiatric:unable to assess Neurologic:no involuntary movements         Lab Data:   Basic Metabolic Panel: Recent Labs  Lab 01/12/21 0419 01/13/21 0618 01/16/21 0321  NA 133*  --  134*  K 5.2* 4.1 4.8  CL 87*  --  91*  CO2 42*  --  38*  GLUCOSE 148*  --  169*  BUN 26*  --  29*  CREATININE 0.79  --  0.74  CALCIUM 9.0  --  9.1    ABG: No results for input(s): PHART, PCO2ART, PO2ART, HCO3, O2SAT in the last 168 hours.  Liver Function Tests: No results for input(s): AST, ALT, ALKPHOS, BILITOT, PROT, ALBUMIN in the last 168 hours. No results for input(s): LIPASE, AMYLASE in the last 168 hours. No results for input(s): AMMONIA in the last 168 hours.  CBC: Recent Labs  Lab 01/12/21 0419 01/16/21 0321  WBC 9.7 11.2*  HGB 11.5* 11.7*  HCT 35.5* 37.0*  MCV 91.7 93.0  PLT 232 197    Cardiac Enzymes: No results for input(s): CKTOTAL, CKMB, CKMBINDEX, TROPONINI in  the last 168 hours.  BNP (last 3 results) Recent Labs    07/03/20 1654 11/08/20 0026  BNP 118.4* 269.8*    ProBNP (last 3 results) No results for input(s): PROBNP in the last 8760 hours.  Radiological Exams: No results found.  Assessment/Plan Active Problems:   Adenocarcinoma of left lung, stage 1 (HCC)   Acute on chronic respiratory failure with hypoxia (HCC)   Atrial fibrillation (HCC)   Supraglottic stenosis   Acute on chronic respiratory failure hypoxia patient continues on T collar we will titrate the oxygen as tolerated we will continue secretion management supportive care. Adenocarcinoma of the lung status postresection we will continue to follow along closely. Chronic atrial fibrillation rate is controlled at this time Sleep apnea patient is having difficulty being compliant with the BiPAP I think it would be best at this time to remain on T collar at nighttime for the patient's safety since he continues to pull off the mask at nighttime despite medical adjustment   I have personally seen and evaluated the patient, evaluated laboratory and imaging results, formulated the assessment and plan and placed orders. The Patient requires high complexity decision making with multiple systems involvement.  Rounds were done with the Respiratory Therapy Director and Staff therapists and discussed with nursing staff also.  Allyne Gee, MD [facility] Pulmonary Critical Care Medicine Sleep Medicine

## 2021-01-18 DIAGNOSIS — J9621 Acute and chronic respiratory failure with hypoxia: Secondary | ICD-10-CM | POA: Diagnosis not present

## 2021-01-18 DIAGNOSIS — I480 Paroxysmal atrial fibrillation: Secondary | ICD-10-CM | POA: Diagnosis not present

## 2021-01-18 DIAGNOSIS — C3492 Malignant neoplasm of unspecified part of left bronchus or lung: Secondary | ICD-10-CM | POA: Diagnosis not present

## 2021-01-18 LAB — BASIC METABOLIC PANEL
Anion gap: 6 (ref 5–15)
BUN: 30 mg/dL — ABNORMAL HIGH (ref 8–23)
CO2: 37 mmol/L — ABNORMAL HIGH (ref 22–32)
Calcium: 8.8 mg/dL — ABNORMAL LOW (ref 8.9–10.3)
Chloride: 94 mmol/L — ABNORMAL LOW (ref 98–111)
Creatinine, Ser: 0.75 mg/dL (ref 0.61–1.24)
GFR, Estimated: >60 mL/min (ref >60)
Glucose, Bld: 137 mg/dL — ABNORMAL HIGH (ref 70–99)
Potassium: 4.4 mmol/L (ref 3.5–5.1)
Sodium: 137 mmol/L (ref 135–145)

## 2021-01-18 LAB — MAGNESIUM: Magnesium: 1.9 mg/dL (ref 1.7–2.4)

## 2021-01-18 NOTE — Progress Notes (Signed)
Pulmonary [place]  PROGRESS NOTE     Devin [name]  [id number]:[id number]  DOB: 05/04/1941   DOA: 11/14/2020  Referring Physician: Merton Border, MD  HPI: Devin [name] is a 80 y.o. male being followed for ventilator/airway/oxygen weaning Acute on Chronic Respiratory Failure.  Patient is comfortable right now without distress on T collar using the PMV during the daytime  Medications: Reviewed on Rounds  Physical Exam:  Vitals: Temperature is 95.3 pulse 75 respiratory rate is 28 blood pressure is 137/75 saturations 98%  Ventilator Settings on T collar FiO2 28%  General: Comfortable at this time Neck: supple Cardiovascular: no malignant arrhythmias Respiratory: No rhonchi very coarse breath sounds Skin: no rash seen on limited exam Musculoskeletal: No gross abnormality Psychiatric:unable to assess Neurologic:no involuntary movements         Lab Data:   Basic Metabolic Panel: Recent Labs  Lab 01/12/21 0419 01/13/21 0618 01/16/21 0321  NA 133*  --  134*  K 5.2* 4.1 4.8  CL 87*  --  91*  CO2 42*  --  38*  GLUCOSE 148*  --  169*  BUN 26*  --  29*  CREATININE 0.79  --  0.74  CALCIUM 9.0  --  9.1    ABG: No results for input(s): PHART, PCO2ART, PO2ART, HCO3, O2SAT in the last 168 hours.  Liver Function Tests: No results for input(s): AST, ALT, ALKPHOS, BILITOT, PROT, ALBUMIN in the last 168 hours. No results for input(s): LIPASE, AMYLASE in the last 168 hours. No results for input(s): AMMONIA in the last 168 hours.  CBC: Recent Labs  Lab 01/12/21 0419 01/16/21 0321  WBC 9.7 11.2*  HGB 11.5* 11.7*  HCT 35.5* 37.0*  MCV 91.7 93.0  PLT 232 197    Cardiac Enzymes: No results for input(s): CKTOTAL, CKMB, CKMBINDEX, TROPONINI in the last 168 hours.  BNP (last 3 results) Recent Labs    07/03/20 1654 11/08/20 0026  BNP 118.4* 269.8*    ProBNP (last 3 results) No results  for input(s): PROBNP in the last 8760 hours.  Radiological Exams: No results found.  Assessment/Plan Active Problems:   Adenocarcinoma of left lung, stage 1 (HCC)   Acute on chronic respiratory failure with hypoxia (HCC)   Atrial fibrillation (HCC)   Supraglottic stenosis   Acute on chronic respiratory failure hypoxia we will continue with the T-piece titrate oxygen continue secretion management supportive care.  She has been using the [place] as ordered.  Patient does not want to be placed back on the BiPAP and has been refusing it at nighttime so we discontinued it yesterday  chronic atrial fibrillation rate now rate is controlled Obstructive sleep apnea we will continue with the T collar at nighttime while asleep Adenocarcinoma stage I supportive care status post resection   I have personally seen and evaluated the patient, evaluated laboratory and imaging results, formulated the assessment and plan and placed orders. The Patient requires high complexity decision making with multiple systems involvement.  Rounds were done with the Respiratory Therapy Director and Staff therapists and discussed with nursing staff also.  Allyne Gee, MD [facility] Pulmonary Critical Care Medicine Sleep Medicine

## 2021-01-19 ENCOUNTER — Other Ambulatory Visit: Payer: Medicare Other

## 2021-01-19 DIAGNOSIS — C3492 Malignant neoplasm of unspecified part of left bronchus or lung: Secondary | ICD-10-CM | POA: Diagnosis not present

## 2021-01-19 DIAGNOSIS — G4733 Obstructive sleep apnea (adult) (pediatric): Secondary | ICD-10-CM

## 2021-01-19 DIAGNOSIS — J9621 Acute and chronic respiratory failure with hypoxia: Secondary | ICD-10-CM | POA: Diagnosis not present

## 2021-01-19 DIAGNOSIS — I480 Paroxysmal atrial fibrillation: Secondary | ICD-10-CM | POA: Diagnosis not present

## 2021-01-19 LAB — BASIC METABOLIC PANEL
Anion gap: 5 (ref 5–15)
BUN: 32 mg/dL — ABNORMAL HIGH (ref 8–23)
CO2: 36 mmol/L — ABNORMAL HIGH (ref 22–32)
Calcium: 8.8 mg/dL — ABNORMAL LOW (ref 8.9–10.3)
Chloride: 94 mmol/L — ABNORMAL LOW (ref 98–111)
Creatinine, Ser: 0.66 mg/dL (ref 0.61–1.24)
GFR, Estimated: >60 mL/min (ref >60)
Glucose, Bld: 141 mg/dL — ABNORMAL HIGH (ref 70–99)
Potassium: 4.6 mmol/L (ref 3.5–5.1)
Sodium: 135 mmol/L (ref 135–145)

## 2021-01-19 LAB — CBC
HCT: 34.8 % — ABNORMAL LOW (ref 39.0–52.0)
Hemoglobin: 11.1 g/dL — ABNORMAL LOW (ref 13.0–17.0)
MCH: 30.2 pg (ref 26.0–34.0)
MCHC: 31.9 g/dL (ref 30.0–36.0)
MCV: 94.6 fL (ref 80.0–100.0)
Platelets: 172 10*3/uL (ref 150–400)
RBC: 3.68 MIL/uL — ABNORMAL LOW (ref 4.22–5.81)
RDW: 16.6 % — ABNORMAL HIGH (ref 11.5–15.5)
WBC: 10 10*3/uL (ref 4.0–10.5)
nRBC: 0 % (ref 0.0–0.2)

## 2021-01-19 NOTE — Progress Notes (Signed)
Pulmonary [place]  PROGRESS NOTE     Devin [name]  ELT:[id number]  DOB: [birth year]   DOA: 11/18/2020  Referring Physician: Merton Border, MD  HPI: Devin [name] is a 80 y.o. male being followed for ventilator/airway/oxygen weaning Acute on Chronic Respiratory Failure.  Patient is resting comfortably right now without distress remains off ventilator currently is on T collar  Medications: Reviewed on Rounds  Physical Exam:  Vitals: Temperature is 96.0 pulse 66 respiratory 20 blood pressure is 146/75 saturations 97%  Ventilator Settings off the ventilator on T collar  General: Comfortable at this time Neck: supple Cardiovascular: no malignant arrhythmias Respiratory: No rhonchi no rales are noted at this time Skin: no rash seen on limited exam Musculoskeletal: No gross abnormality Psychiatric:unable to assess Neurologic:no involuntary movements         Lab Data:   Basic Metabolic Panel: Recent Labs  Lab 01/13/21 0618 01/16/21 0321 01/18/21 1049  NA  --  134* 137  K 4.1 4.8 4.4  CL  --  91* 94*  CO2  --  38* 37*  GLUCOSE  --  169* 137*  BUN  --  29* 30*  CREATININE  --  0.74 0.75  CALCIUM  --  9.1 8.8*  MG  --   --  1.9    ABG: No results for input(s): PHART, PCO2ART, PO2ART, HCO3, O2SAT in the last 168 hours.  Liver Function Tests: No results for input(s): AST, ALT, ALKPHOS, BILITOT, PROT, ALBUMIN in the last 168 hours. No results for input(s): LIPASE, AMYLASE in the last 168 hours. No results for input(s): AMMONIA in the last 168 hours.  CBC: Recent Labs  Lab 01/16/21 0321 01/19/21 0326  WBC 11.2* 10.0  HGB 11.7* 11.1*  HCT 37.0* 34.8*  MCV 93.0 94.6  PLT 197 172    Cardiac Enzymes: No results for input(s): CKTOTAL, CKMB, CKMBINDEX, TROPONINI in the last 168 hours.  BNP (last 3 results) Recent Labs    07/03/20 1654 11/08/20 0026  BNP 118.4* 269.8*     ProBNP (last 3 results) No results for input(s): PROBNP in the last 8760 hours.  Radiological Exams: No results found.  Assessment/Plan Active Problems:   Adenocarcinoma of left lung, stage 1 (HCC)   Acute on chronic respiratory failure with hypoxia (HCC)   Atrial fibrillation (HCC)   Obstructive sleep apnea   Acute on chronic respiratory failure hypoxia we will continue with the T-piece titrate oxygen as needed patient is at baseline now Adenocarcinoma of the lung status post resection Chronic atrial fibrillation rate is controlled Obstructive sleep apnea patient has tracheostomy in place no issues at this time   I have personally seen and evaluated the patient, evaluated laboratory and imaging results, formulated the assessment and plan and placed orders. The Patient requires high complexity decision making with multiple systems involvement.  Rounds were done with the Respiratory Therapy Director and Staff therapists and discussed with nursing staff also.  Allyne Gee, MD [facility] Pulmonary Critical Care Medicine Sleep Medicine

## 2021-01-22 ENCOUNTER — Ambulatory Visit: Payer: Medicare Other | Admitting: Internal Medicine

## 2021-01-22 LAB — CULTURE, RESPIRATORY W GRAM STAIN: Culture: NORMAL

## 2021-01-23 LAB — CBC
HCT: 36.4 % — ABNORMAL LOW (ref 39.0–52.0)
Hemoglobin: 11.6 g/dL — ABNORMAL LOW (ref 13.0–17.0)
MCH: 29.5 pg (ref 26.0–34.0)
MCHC: 31.9 g/dL (ref 30.0–36.0)
MCV: 92.6 fL (ref 80.0–100.0)
Platelets: 187 10*3/uL (ref 150–400)
RBC: 3.93 MIL/uL — ABNORMAL LOW (ref 4.22–5.81)
RDW: 17.3 % — ABNORMAL HIGH (ref 11.5–15.5)
WBC: 10.8 10*3/uL — ABNORMAL HIGH (ref 4.0–10.5)
nRBC: 0 % (ref 0.0–0.2)

## 2021-01-23 LAB — BASIC METABOLIC PANEL
Anion gap: 8 (ref 5–15)
BUN: 26 mg/dL — ABNORMAL HIGH (ref 8–23)
CO2: 38 mmol/L — ABNORMAL HIGH (ref 22–32)
Calcium: 9.4 mg/dL (ref 8.9–10.3)
Chloride: 88 mmol/L — ABNORMAL LOW (ref 98–111)
Creatinine, Ser: 0.66 mg/dL (ref 0.61–1.24)
GFR, Estimated: >60 mL/min (ref >60)
Glucose, Bld: 128 mg/dL — ABNORMAL HIGH (ref 70–99)
Potassium: 5.1 mmol/L (ref 3.5–5.1)
Sodium: 134 mmol/L — ABNORMAL LOW (ref 135–145)

## 2021-01-26 LAB — CBC
HCT: 34.6 % — ABNORMAL LOW (ref 39.0–52.0)
Hemoglobin: 10.8 g/dL — ABNORMAL LOW (ref 13.0–17.0)
MCH: 29.4 pg (ref 26.0–34.0)
MCHC: 31.2 g/dL (ref 30.0–36.0)
MCV: 94.3 fL (ref 80.0–100.0)
Platelets: 195 10*3/uL (ref 150–400)
RBC: 3.67 MIL/uL — ABNORMAL LOW (ref 4.22–5.81)
RDW: 17.3 % — ABNORMAL HIGH (ref 11.5–15.5)
WBC: 8.3 10*3/uL (ref 4.0–10.5)
nRBC: 0 % (ref 0.0–0.2)

## 2021-01-26 LAB — BASIC METABOLIC PANEL
Anion gap: 6 (ref 5–15)
BUN: 28 mg/dL — ABNORMAL HIGH (ref 8–23)
CO2: 38 mmol/L — ABNORMAL HIGH (ref 22–32)
Calcium: 8.9 mg/dL (ref 8.9–10.3)
Chloride: 89 mmol/L — ABNORMAL LOW (ref 98–111)
Creatinine, Ser: 0.66 mg/dL (ref 0.61–1.24)
GFR, Estimated: >60 mL/min (ref >60)
Glucose, Bld: 126 mg/dL — ABNORMAL HIGH (ref 70–99)
Potassium: 4.8 mmol/L (ref 3.5–5.1)
Sodium: 133 mmol/L — ABNORMAL LOW (ref 135–145)

## 2021-01-27 ENCOUNTER — Other Ambulatory Visit (HOSPITAL_COMMUNITY): Payer: Self-pay

## 2021-01-29 LAB — BASIC METABOLIC PANEL
Anion gap: 6 (ref 5–15)
BUN: 27 mg/dL — ABNORMAL HIGH (ref 8–23)
CO2: 38 mmol/L — ABNORMAL HIGH (ref 22–32)
Calcium: 9 mg/dL (ref 8.9–10.3)
Chloride: 90 mmol/L — ABNORMAL LOW (ref 98–111)
Creatinine, Ser: 0.72 mg/dL (ref 0.61–1.24)
GFR, Estimated: >60 mL/min (ref >60)
Glucose, Bld: 191 mg/dL — ABNORMAL HIGH (ref 70–99)
Potassium: 4.7 mmol/L (ref 3.5–5.1)
Sodium: 134 mmol/L — ABNORMAL LOW (ref 135–145)

## 2021-01-29 LAB — MAGNESIUM: Magnesium: 1.9 mg/dL (ref 1.7–2.4)

## 2021-01-29 NOTE — Consult Note (Signed)
Referring Physician: S. Owens Shark, MD  Devin [name] is an 80 y.o. male.                       Chief Complaint: Orthostatic hypotension  HPI: 80 years old white male with PMH of Crohn;s disease, thyroid cancer, atrial fibrillation had fall and fracture of T11 needing T11-T12 fusion surgery. He had respiratory failure following the surgery. He also had Klebsiella pneumonia and sepsis followed by supraglottic, subglottic stenoses and tracheomalacia. He had tracheostomy changed to XLT tach for inflammation. [facility] he has orthostatic hypotension and protein calorie malnutrition.   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    Obesity (BMI 30-39.9)    Osteoporosis    Pt completed 5 years of fosamax in 2013      Paroxysmal atrial fibrillation [facility])    Prostate cancer with recurrence    Treated with prostatectomy with recurrence 2012 with Lupron treatment now him    Psoriasis    Supraglottic stenosis    Thyroid cancer ([place]) 1999   Treated with RAI and total thyroidectomy    Type II diabetes mellitus ([place])       Past Surgical History:  Procedure Laterality Date   ABDOMINAL EXPLORATION SURGERY     for small bowel obstruction   APPENDECTOMY  03/2007   BACK SURGERY     BASAL CELL CARCINOMA EXCISION  "several"   "head"   CARDIAC CATHETERIZATION  03/17/2003   CHOLECYSTECTOMY N/A 05/07/2016   Procedure: LAPAROSCOPIC CHOLECYSTECTOMY;  Surgeon: Fanny Skates, MD;  Location: [facility];  Service: General;  Laterality: N/A;   COLON SURGERY   03/2007   Resection of cecum, appendix, terminal ileum (approximately/notes 10/10/2010   CYSTOSCOPY/URETEROSCOPY/HOLMIUM LASER/STENT PLACEMENT Right 07/03/2020   Procedure: CYSTOSCOPY/RETROGRADE/URETEROSCOPY REMOVAL OF BLADDER STONE;  Surgeon: Raynelle Bring, MD;  Location: [facility];  Service: Urology;  Laterality: Right;   DIRECT LARYNGOSCOPY N/A 10/04/2020   Procedure: DIRECT LARYNGOSCOPY, PHARYNGOSCOPY WITH BIOPSY;  Surgeon: [name], DO;  Location: [facility];  Service: ENT;  Laterality: N/A;   ESOPHAGOSCOPY N/A 10/04/2020   Procedure: ESOPHAGOSCOPY WITH BIOPSY;  Surgeon: [name], DO;  Location: [place];  Service: ENT;  Laterality: N/A;   GASTROSTOMY N/A 10/31/2020   Procedure: INSERTION OF GASTROSTOMY TUBE;  Surgeon: Rolm Bookbinder, MD;  Location: [place];  Service: General;  Laterality: N/A;   HERNIA REPAIR     IR [place] GASTRO/COLONIC TUBE PERCUT W/FLUORO  11/28/2020   LAMINECTOMY WITH POSTERIOR LATERAL ARTHRODESIS LEVEL 1 N/A 09/15/2020   Procedure: REVISION OF THORACOLUMBAR FUSION, ADDITION OF CROSS-CONNECTORS;  Surgeon: Eustace Moore, MD;  Location: [facility];  Service: Neurosurgery;  Laterality: N/A;   LAPAROSCOPIC CHOLECYSTECTOMY  05/07/2016   LAPAROSCOPIC LYSIS OF ADHESIONS  05/07/2016   LAPAROSCOPIC LYSIS OF ADHESIONS N/A 05/07/2016   Procedure: LAPAROSCOPIC LYSIS OF ADHESIONS TIMES ONE HOUR;  Surgeon: Fanny Skates, MD;  Location: [facility];  Service: General;  Laterality: N/A;   LAPAROTOMY N/A 10/31/2020   Procedure: EXPLORATORY LAPAROTOMY WITH [place] OUT;  Surgeon: Rolm Bookbinder, MD;  Location: [place];  Service: General;  Laterality: N/A;   POSTERIOR FUSION THORACIC SPINE  02/08/2016   1. Posterior thoracic arthrodesis T7-T11 utilizing morcellized allograft, 2. Posterior thoracic segmental fixation T7-T11 utilizing nuvasive pedicle screws   PROSTATECTOMY  06/2001   w/bilateral pelvic lymph nose dissection/notes 10/24/2010   SPINAL FUSION  12/2014   Open reduction internal  fixation of L1 [place] fracture with posterior fusion T10-L4 utilizing morcellized allograft and some local autograft, segmental instrumentation T10-L4 inclusive utilizing nuvasive pedicle screws/notes 12/16/2014   Stress Cardiolite  02/17/2003   THOROCOTOMY WITH LOBECTOMY  03/16/2015   Procedure: THOROCOTOMY WITH LOBECTOMY;  Surgeon: Ivin Poot, MD;  Location: [place];  Service: Thoracic;;   TONSILLECTOMY     TOTAL THYROIDECTOMY  1997   Venous Doppler  05/30/2004   VENTRAL HERNIA REPAIR  04/14/2008   VIDEO ASSISTED THORACOSCOPY Left 03/16/2015   Procedure: VIDEO ASSISTED THORACOSCOPY;  Surgeon: Ivin Poot, MD;  Location: [facility];  Service: Thoracic;  Laterality: Left;    Family History  Problem Relation Age of Onset   CAD Mother    Cancer Neg Hx        No cancer in the patient's immediate family, except of course for the patient himself, as noted   Social History:  reports that he quit smoking about 31 years ago. His smoking use included cigarettes. He has a 60.00 pack-year smoking history. He has never used smokeless tobacco. He reports that he does not currently use alcohol. He reports that he does not use drugs.  Allergies: No Known Allergies  Medications Prior to Admission  Medication Sig Dispense Refill   acetaminophen (TYLENOL) 325 MG tablet Place 2 tablets (650 mg total) into feeding tube every 6 (six) hours as needed for mild pain.     ALPRAZolam (XANAX) 0.25 MG tablet Take 0.5 tablets (0.125 mg total) by mouth 2 (two) times daily as needed for anxiety (use only for severe anxiety please). 30 tablet 0   arformoterol (BROVANA) 15 MCG/2ML NEBU Take 2 mLs (15 mcg total) by nebulization 2 (two) times daily. 120 mL    bethanechol (URECHOLINE) 10 MG tablet Place 1 tablet (10 mg total) into feeding tube 4 (four) times daily.     budesonide (PULMICORT) 0.5 MG/2ML nebulizer solution Take 2 mLs (0.5 mg total) by nebulization 2 (two) times daily.  12   cholestyramine (QUESTRAN) 4 g packet  Place 1 packet (4 g total) into feeding tube 2 (two) times daily before lunch and supper. 60 each 12   diltiazem (CARDIZEM) 10 mg/ml oral suspension Place 3 mLs (30 mg total) into feeding tube every 6 (six) hours.     diphenoxylate-atropine (LOMOTIL) 2.5-0.025 MG tablet Take 1 tablet by mouth daily as needed for diarrhea or loose stools (after a watery BM). 30 tablet 0   insulin aspart (NOVOLOG) 100 UNIT/ML injection Inject 0-9 Units into the skin every 4 (four) hours. 10 mL 11   insulin glargine (LANTUS) 100 UNIT/ML injection Inject 0.12 mLs (12 Units total) into the skin at bedtime. 10 mL 11   ipratropium-albuterol (DUONEB) 0.5-2.5 (3) MG/3ML SOLN Take 3 mLs by nebulization every 4 (four) hours as needed. 360 mL    levothyroxine (SYNTHROID) 150 MCG tablet Place 1 tablet (150 mcg total) into feeding tube daily at 6 (six) AM.     Multiple Vitamin (MULTIVITAMIN  WITH MINERALS) TABS tablet Place 1 tablet into feeding tube daily.     Nutritional Supplements (FEEDING SUPPLEMENT, PROSOURCE TF,) liquid Place 45 mLs into feeding tube 3 (three) times daily.     Nutritional Supplements (FEEDING SUPPLEMENT, VITAL AF 1.2 CAL,) LIQD Place 1,000 mLs into feeding tube continuous.     Nystatin (GERHARDT'S BUTT CREAM) CREA Apply 1 application topically 2 (two) times daily.     oxyCODONE (OXY IR/ROXICODONE) 5 MG immediate release tablet Place 1 tablet (5 mg total) into feeding tube every 4 (four) hours as needed for moderate pain. 30 tablet 0   PARoxetine (PAXIL) 10 MG tablet Place 1 tablet (10 mg total) into feeding tube daily.     sodium chloride 0.9 % infusion Inject 75 mLs into the vein continuous.  0   tamsulosin (FLOMAX) 0.4 MG CAPS capsule Take 1 capsule (0.4 mg total) by mouth daily. 30 capsule    traZODone (DESYREL) 100 MG tablet Place 1 tablet (100 mg total) into feeding tube at bedtime.      Results for orders placed or performed during the hospital encounter of 11/29/2020 (from the past 48 hour(s))  Basic  metabolic panel     Status: Abnormal   Collection Time: 01/29/21  9:43 AM  Result Value Ref Range   Sodium 134 (L) 135 - 145 mmol/L   Potassium 4.7 3.5 - 5.1 mmol/L   Chloride 90 (L) 98 - 111 mmol/L   CO2 38 (H) 22 - 32 mmol/L   Glucose, Bld 191 (H) 70 - 99 mg/dL    Comment: Glucose reference range applies only to samples taken after fasting for at least 8 hours.   BUN 27 (H) 8 - 23 mg/dL   Creatinine, Ser 0.72 0.61 - 1.24 mg/dL   Calcium 9.0 8.9 - 10.3 mg/dL   GFR, Estimated >60 >60 mL/min    Comment: (NOTE) Calculated using the CKD-EPI Creatinine Equation (2021)    Anion gap 6 5 - 15    Comment: Performed at [facility] [address], [place], [place] [address]  Magnesium     Status: None   Collection Time: 01/29/21  9:43 AM  Result Value Ref Range   Magnesium 1.9 1.7 - 2.4 mg/dL    Comment: Performed at [facility] [address] [place], [place] [address]   No results found.  Review Of Systems Constitutional: No fever, chills, positive weight loss. Eyes: No vision change, wears glasses. No discharge or pain. Ears: No hearing loss, No tinnitus. Respiratory: No asthma, positive COPD, positive pneumonias. positive shortness of breath. No hemoptysis. Cardiovascular: No chest pain, palpitation, leg edema. Gastrointestinal: No nausea, vomiting, diarrhea, constipation. No GI bleed. No hepatitis. Genitourinary: No dysuria, hematuria, kidney stone. No incontinance. Neurological: No headache, stroke, seizures.  Psychiatry: No psych facility admission for anxiety, depression, suicide. No detox. Skin: No rash. Musculoskeletal: Positive joint pain, no fibromyalgia. Positive neck pain, back pain. Lymphadenopathy: No lymphadenopathy. Hematology: Positive anemia or easy bruising.  P: 70, R: 20, BP: 130/60 lying down and 90/50 standing. O2 sat 97 % Blood pressure (!) 113/54. There is no height or weight on file to calculate BMI. General appearance: alert, cooperative,  appears stated age and mild distress Head: Normocephalic, atraumatic. Eyes: Blue eyes, pink conjunctiva, corneas clear. PERRL, EOM's intact. Neck: No adenopathy, no carotid bruit, no JVD, supple, symmetrical, trachea midline and thyroid not enlarged. Resp: Clearing to auscultation bilaterally. Cardio: Regular rate and rhythm, S1, S2 normal, II/VI systolic murmur,  no click, rub or gallop GI: Soft, non-tender; bowel sounds normal; no organomegaly. Extremities: No edema, cyanosis or clubbing. Skin: Warm and dry.  Neurologic: Alert and oriented X 2, normal strength.  Assessment/Plan Orthostatic hypotension Dehydration Acute on chronic respiratory failure with hypoxia Atrial fibrillation, paroxysmal Protein calorie malnutrition Severe hypoalbuminemia Type 2 DM Postsurgical hypothyroidism Prostate cancer Crohn's disease Adenocarcinoma of left lung S/P left lung lobectomy T11 fracture  IV fluids as tolerated. IV albumin to improve blood pressure. Consider Midodrine 5-10 mg. daily if above do not work.  Time spent: Review of old records, Lab, x-rays, EKG, other cardiac tests, examination, discussion with patient/Nurse/Tech/Doctor over 70 minutes.  Birdie Riddle, MD  01/29/2021, 3:18 PM

## 2021-01-31 ENCOUNTER — Ambulatory Visit (HOSPITAL_COMMUNITY): Payer: Medicare Other | Admitting: Physician Assistant

## 2021-02-01 LAB — CBC
HCT: 31.8 % — ABNORMAL LOW (ref 39.0–52.0)
Hemoglobin: 10.3 g/dL — ABNORMAL LOW (ref 13.0–17.0)
MCH: 30.5 pg (ref 26.0–34.0)
MCHC: 32.4 g/dL (ref 30.0–36.0)
MCV: 94.1 fL (ref 80.0–100.0)
Platelets: 245 10*3/uL (ref 150–400)
RBC: 3.38 MIL/uL — ABNORMAL LOW (ref 4.22–5.81)
RDW: 17.1 % — ABNORMAL HIGH (ref 11.5–15.5)
WBC: 12.2 10*3/uL — ABNORMAL HIGH (ref 4.0–10.5)
nRBC: 0 % (ref 0.0–0.2)

## 2021-02-01 LAB — BASIC METABOLIC PANEL
Anion gap: 8 (ref 5–15)
BUN: 26 mg/dL — ABNORMAL HIGH (ref 8–23)
CO2: 33 mmol/L — ABNORMAL HIGH (ref 22–32)
Calcium: 9.3 mg/dL (ref 8.9–10.3)
Chloride: 92 mmol/L — ABNORMAL LOW (ref 98–111)
Creatinine, Ser: 0.7 mg/dL (ref 0.61–1.24)
GFR, Estimated: >60 mL/min (ref >60)
Glucose, Bld: 159 mg/dL — ABNORMAL HIGH (ref 70–99)
Potassium: 4.4 mmol/L (ref 3.5–5.1)
Sodium: 133 mmol/L — ABNORMAL LOW (ref 135–145)

## 2021-02-01 LAB — OCCULT BLOOD X 1 CARD TO LAB, STOOL: Fecal Occult Bld: POSITIVE — AB

## 2021-02-02 LAB — CBC
HCT: 28.9 % — ABNORMAL LOW (ref 39.0–52.0)
HCT: 29.7 % — ABNORMAL LOW (ref 39.0–52.0)
Hemoglobin: 9.2 g/dL — ABNORMAL LOW (ref 13.0–17.0)
Hemoglobin: 9.4 g/dL — ABNORMAL LOW (ref 13.0–17.0)
MCH: 30.3 pg (ref 26.0–34.0)
MCH: 30.1 pg (ref 26.0–34.0)
MCHC: 31.8 g/dL (ref 30.0–36.0)
MCHC: 31.6 g/dL (ref 30.0–36.0)
MCV: 95.1 fL (ref 80.0–100.0)
MCV: 95.2 fL (ref 80.0–100.0)
Platelets: 265 10*3/uL (ref 150–400)
Platelets: 298 10*3/uL (ref 150–400)
RBC: 3.04 MIL/uL — ABNORMAL LOW (ref 4.22–5.81)
RBC: 3.12 MIL/uL — ABNORMAL LOW (ref 4.22–5.81)
RDW: 17.5 % — ABNORMAL HIGH (ref 11.5–15.5)
RDW: 17.3 % — ABNORMAL HIGH (ref 11.5–15.5)
WBC: 12.3 10*3/uL — ABNORMAL HIGH (ref 4.0–10.5)
WBC: 15.7 10*3/uL — ABNORMAL HIGH (ref 4.0–10.5)
nRBC: 0 % (ref 0.0–0.2)
nRBC: 0 % (ref 0.0–0.2)

## 2021-02-02 LAB — BASIC METABOLIC PANEL
Anion gap: 5 (ref 5–15)
BUN: 35 mg/dL — ABNORMAL HIGH (ref 8–23)
CO2: 35 mmol/L — ABNORMAL HIGH (ref 22–32)
Calcium: 9.1 mg/dL (ref 8.9–10.3)
Chloride: 95 mmol/L — ABNORMAL LOW (ref 98–111)
Creatinine, Ser: 0.65 mg/dL (ref 0.61–1.24)
GFR, Estimated: >60 mL/min (ref >60)
Glucose, Bld: 135 mg/dL — ABNORMAL HIGH (ref 70–99)
Potassium: 4.7 mmol/L (ref 3.5–5.1)
Sodium: 135 mmol/L (ref 135–145)

## 2021-02-02 LAB — PROTIME-INR
INR: 1 (ref 0.8–1.2)
Prothrombin Time: 13.1 seconds (ref 11.4–15.2)

## 2021-02-02 LAB — OCCULT BLOOD X 1 CARD TO LAB, STOOL: Fecal Occult Bld: POSITIVE — AB

## 2021-02-03 ENCOUNTER — Other Ambulatory Visit (HOSPITAL_COMMUNITY): Payer: Self-pay

## 2021-02-03 LAB — URINALYSIS, ROUTINE W REFLEX MICROSCOPIC
Bilirubin Urine: NEGATIVE
Glucose, UA: NEGATIVE mg/dL
Hgb urine dipstick: NEGATIVE
Ketones, ur: NEGATIVE mg/dL
Nitrite: NEGATIVE
Protein, ur: NEGATIVE mg/dL
Specific Gravity, Urine: 1.021 (ref 1.005–1.030)
pH: 5 (ref 5.0–8.0)

## 2021-02-04 ENCOUNTER — Other Ambulatory Visit (HOSPITAL_COMMUNITY): Payer: Self-pay

## 2021-02-04 LAB — CBC
HCT: 26.5 % — ABNORMAL LOW (ref 39.0–52.0)
Hemoglobin: 8.5 g/dL — ABNORMAL LOW (ref 13.0–17.0)
MCH: 31.3 pg (ref 26.0–34.0)
MCHC: 32.1 g/dL (ref 30.0–36.0)
MCV: 97.4 fL (ref 80.0–100.0)
Platelets: 289 10*3/uL (ref 150–400)
RBC: 2.72 MIL/uL — ABNORMAL LOW (ref 4.22–5.81)
RDW: 17.3 % — ABNORMAL HIGH (ref 11.5–15.5)
WBC: 11.4 10*3/uL — ABNORMAL HIGH (ref 4.0–10.5)
nRBC: 0 % (ref 0.0–0.2)

## 2021-02-04 LAB — PREPARE RBC (CROSSMATCH)

## 2021-02-05 LAB — CBC
HCT: 29.6 % — ABNORMAL LOW (ref 39.0–52.0)
HCT: 31.3 % — ABNORMAL LOW (ref 39.0–52.0)
Hemoglobin: 9.3 g/dL — ABNORMAL LOW (ref 13.0–17.0)
Hemoglobin: 9.8 g/dL — ABNORMAL LOW (ref 13.0–17.0)
MCH: 30.6 pg (ref 26.0–34.0)
MCH: 31.2 pg (ref 26.0–34.0)
MCHC: 31.4 g/dL (ref 30.0–36.0)
MCHC: 31.3 g/dL (ref 30.0–36.0)
MCV: 97.4 fL (ref 80.0–100.0)
MCV: 99.7 fL (ref 80.0–100.0)
Platelets: 271 10*3/uL (ref 150–400)
Platelets: 303 10*3/uL (ref 150–400)
RBC: 3.04 MIL/uL — ABNORMAL LOW (ref 4.22–5.81)
RBC: 3.14 MIL/uL — ABNORMAL LOW (ref 4.22–5.81)
RDW: 16.5 % — ABNORMAL HIGH (ref 11.5–15.5)
RDW: 16.5 % — ABNORMAL HIGH (ref 11.5–15.5)
WBC: 9.3 10*3/uL (ref 4.0–10.5)
WBC: 13.9 10*3/uL — ABNORMAL HIGH (ref 4.0–10.5)
nRBC: 0 % (ref 0.0–0.2)
nRBC: 0 % (ref 0.0–0.2)

## 2021-02-05 LAB — TYPE AND SCREEN
ABO/RH(D): O POS
Antibody Screen: NEGATIVE
Unit division: 0

## 2021-02-05 LAB — URINE CULTURE: Culture: >=100000 — AB

## 2021-02-05 LAB — BPAM RBC
Blood Product Expiration Date: 202209282359
ISSUE DATE / TIME: 202208281215
Unit Type and Rh: 5100

## 2021-02-06 ENCOUNTER — Other Ambulatory Visit (HOSPITAL_COMMUNITY): Payer: Self-pay

## 2021-02-06 LAB — BASIC METABOLIC PANEL
Anion gap: 10 (ref 5–15)
BUN: 27 mg/dL — ABNORMAL HIGH (ref 8–23)
CO2: 32 mmol/L (ref 22–32)
Calcium: 8.8 mg/dL — ABNORMAL LOW (ref 8.9–10.3)
Chloride: 98 mmol/L (ref 98–111)
Creatinine, Ser: 0.73 mg/dL (ref 0.61–1.24)
GFR, Estimated: >60 mL/min (ref >60)
Glucose, Bld: 106 mg/dL — ABNORMAL HIGH (ref 70–99)
Potassium: 4 mmol/L (ref 3.5–5.1)
Sodium: 140 mmol/L (ref 135–145)

## 2021-02-06 LAB — CBC
HCT: 27.9 % — ABNORMAL LOW (ref 39.0–52.0)
Hemoglobin: 8.5 g/dL — ABNORMAL LOW (ref 13.0–17.0)
MCH: 30.1 pg (ref 26.0–34.0)
MCHC: 30.5 g/dL (ref 30.0–36.0)
MCV: 98.9 fL (ref 80.0–100.0)
Platelets: 316 10*3/uL (ref 150–400)
RBC: 2.82 MIL/uL — ABNORMAL LOW (ref 4.22–5.81)
RDW: 16.1 % — ABNORMAL HIGH (ref 11.5–15.5)
WBC: 10.5 10*3/uL (ref 4.0–10.5)
nRBC: 0 % (ref 0.0–0.2)

## 2021-02-06 MED ORDER — IOHEXOL 350 MG/ML SOLN
100.0000 mL | Freq: Once | INTRAVENOUS | Status: AC | PRN
  Administered 2021-02-06: 100 mL via INTRAVENOUS

## 2021-02-08 ENCOUNTER — Other Ambulatory Visit (HOSPITAL_COMMUNITY): Payer: Self-pay

## 2021-02-08 LAB — CBC
HCT: 28.9 % — ABNORMAL LOW (ref 39.0–52.0)
HCT: 25 % — ABNORMAL LOW (ref 39.0–52.0)
Hemoglobin: 8.9 g/dL — ABNORMAL LOW (ref 13.0–17.0)
Hemoglobin: 7.5 g/dL — ABNORMAL LOW (ref 13.0–17.0)
MCH: 31.1 pg (ref 26.0–34.0)
MCH: 30.9 pg (ref 26.0–34.0)
MCHC: 30.8 g/dL (ref 30.0–36.0)
MCHC: 30 g/dL (ref 30.0–36.0)
MCV: 101 fL — ABNORMAL HIGH (ref 80.0–100.0)
MCV: 102.9 fL — ABNORMAL HIGH (ref 80.0–100.0)
Platelets: 304 10*3/uL (ref 150–400)
Platelets: 337 10*3/uL (ref 150–400)
RBC: 2.86 MIL/uL — ABNORMAL LOW (ref 4.22–5.81)
RBC: 2.43 MIL/uL — ABNORMAL LOW (ref 4.22–5.81)
RDW: 16.5 % — ABNORMAL HIGH (ref 11.5–15.5)
RDW: 16.8 % — ABNORMAL HIGH (ref 11.5–15.5)
WBC: 13.1 10*3/uL — ABNORMAL HIGH (ref 4.0–10.5)
WBC: 23.7 10*3/uL — ABNORMAL HIGH (ref 4.0–10.5)
nRBC: 0 % (ref 0.0–0.2)
nRBC: 0.1 % (ref 0.0–0.2)

## 2021-02-08 LAB — BASIC METABOLIC PANEL
Anion gap: 4 — ABNORMAL LOW (ref 5–15)
BUN: 25 mg/dL — ABNORMAL HIGH (ref 8–23)
CO2: 34 mmol/L — ABNORMAL HIGH (ref 22–32)
Calcium: 8.7 mg/dL — ABNORMAL LOW (ref 8.9–10.3)
Chloride: 99 mmol/L (ref 98–111)
Creatinine, Ser: 0.75 mg/dL (ref 0.61–1.24)
GFR, Estimated: >60 mL/min (ref >60)
Glucose, Bld: 270 mg/dL — ABNORMAL HIGH (ref 70–99)
Potassium: 3.8 mmol/L (ref 3.5–5.1)
Sodium: 137 mmol/L (ref 135–145)

## 2021-02-08 LAB — TROPONIN I (HIGH SENSITIVITY): Troponin I (High Sensitivity): 8 ng/L (ref <18)

## 2021-02-09 LAB — CULTURE, BLOOD (ROUTINE X 2)
Culture: NO GROWTH
Culture: NO GROWTH
Special Requests: ADEQUATE
Special Requests: ADEQUATE

## 2021-02-09 LAB — CBC
HCT: 22.6 % — ABNORMAL LOW (ref 39.0–52.0)
Hemoglobin: 7 g/dL — ABNORMAL LOW (ref 13.0–17.0)
MCH: 31.5 pg (ref 26.0–34.0)
MCHC: 31 g/dL (ref 30.0–36.0)
MCV: 101.8 fL — ABNORMAL HIGH (ref 80.0–100.0)
Platelets: 396 10*3/uL (ref 150–400)
RBC: 2.22 MIL/uL — ABNORMAL LOW (ref 4.22–5.81)
RDW: 16.8 % — ABNORMAL HIGH (ref 11.5–15.5)
WBC: 21.3 10*3/uL — ABNORMAL HIGH (ref 4.0–10.5)
nRBC: 0.1 % (ref 0.0–0.2)

## 2021-02-12 LAB — BPAM RBC
Blood Product Expiration Date: 202210012359
Unit Type and Rh: 5100

## 2021-02-12 LAB — TYPE AND SCREEN
ABO/RH(D): O POS
Antibody Screen: NEGATIVE
Unit division: 0

## 2021-03-10 DEATH — deceased

## 2021-03-13 ENCOUNTER — Ambulatory Visit (HOSPITAL_COMMUNITY): Payer: Medicare Other | Admitting: Physician Assistant

## 2021-11-08 IMAGING — CT CT CHEST W/ CM
2 of 4 series · 15 of 36 positions shown, 18 images · IV contrast (omnipaque)
Comparison: 08/31/2018

CLINICAL DATA: Lung cancer staging, status post left upper
lobectomy 0081

EXAM:
CT CHEST WITH CONTRAST
TECHNIQUE: Multidetector CT imaging of the chest was performed during
intravenous contrast administration.
CONTRAST:  75mL OMNIPAQUE IOHEXOL 300 MG/ML  SOLN

[Series 2: axial (person_name) (person_name) · axial · 0.78mm/px · z∈[-330,-70]mm · 12 of 151 slices shown, 15 images]
[im 11/151  mediastinal]
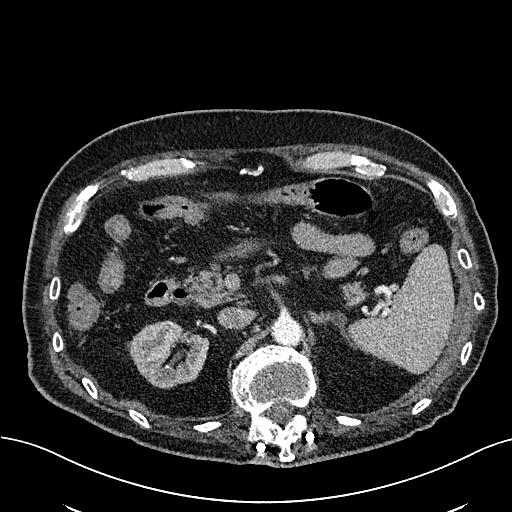
[im 11/151  lung]
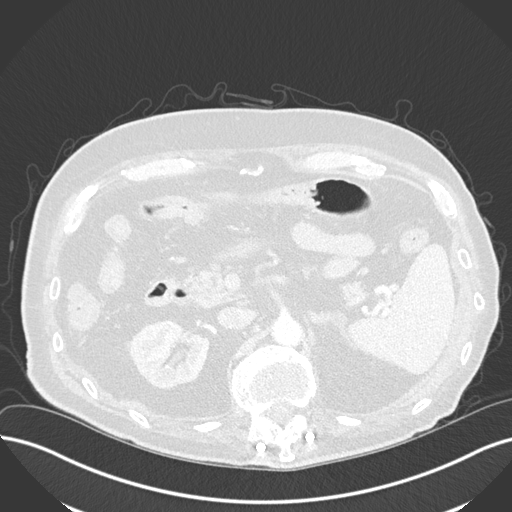
[im 21/151  lung]
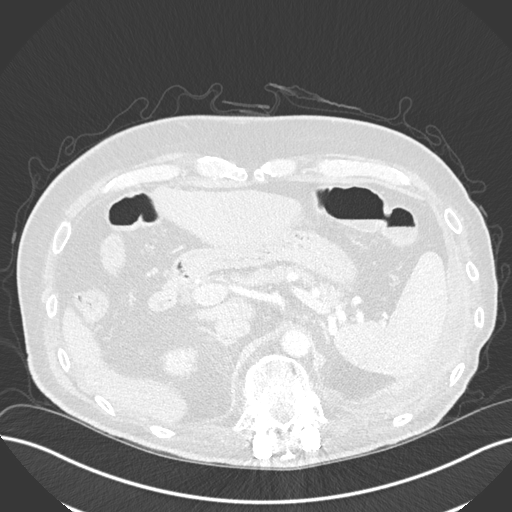
[im 31/151  lung]
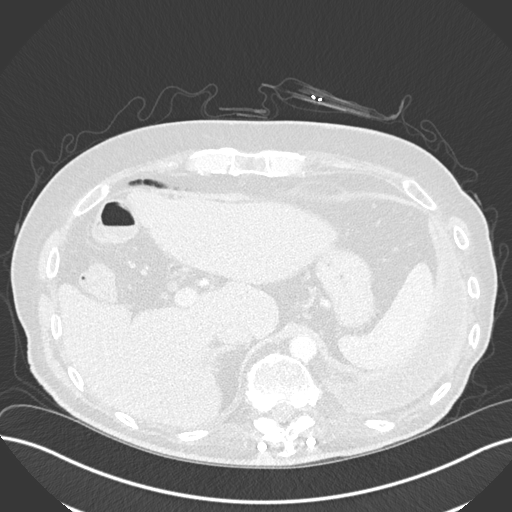
[im 51/151  lung]
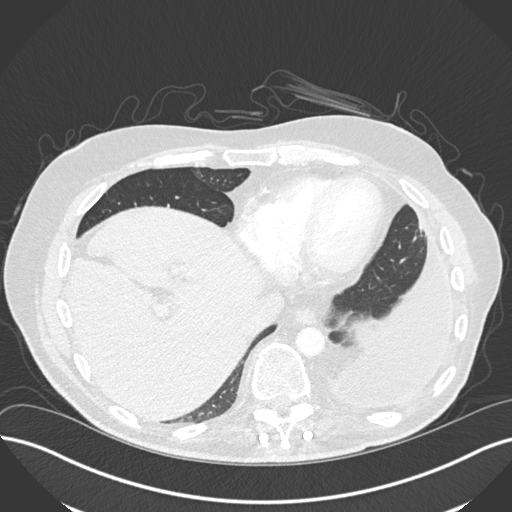
[im 61/151  mediastinal]
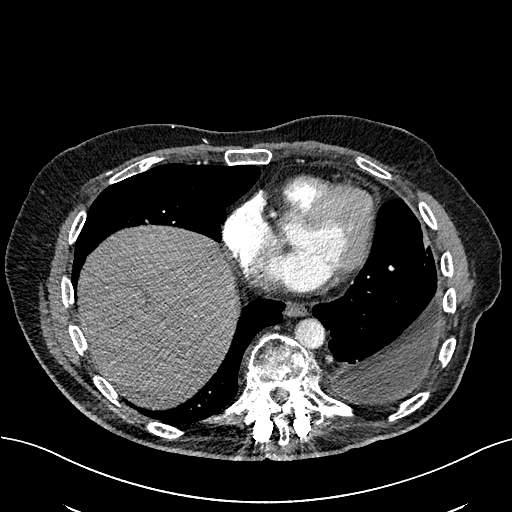
[im 61/151  lung]
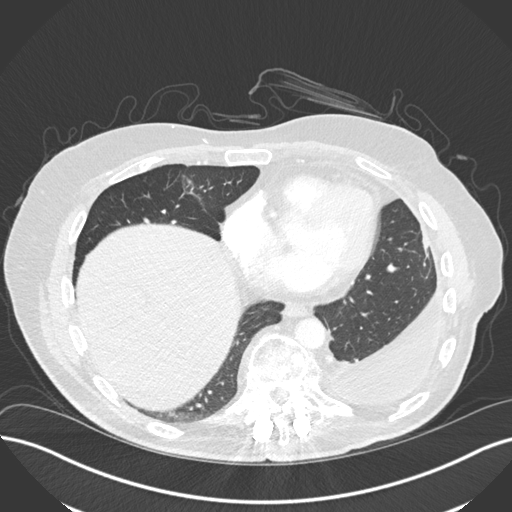
[im 71/151  lung]
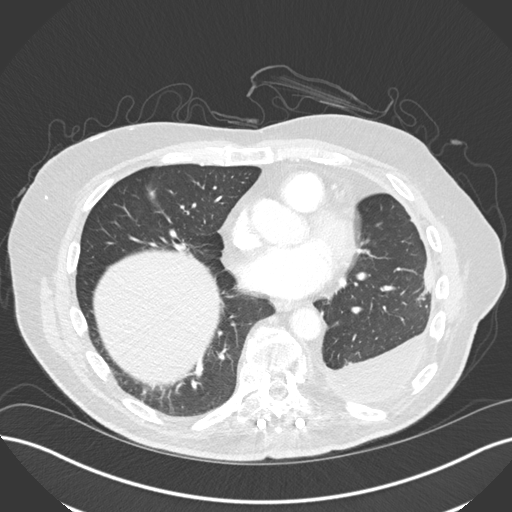
[im 81/151  lung]
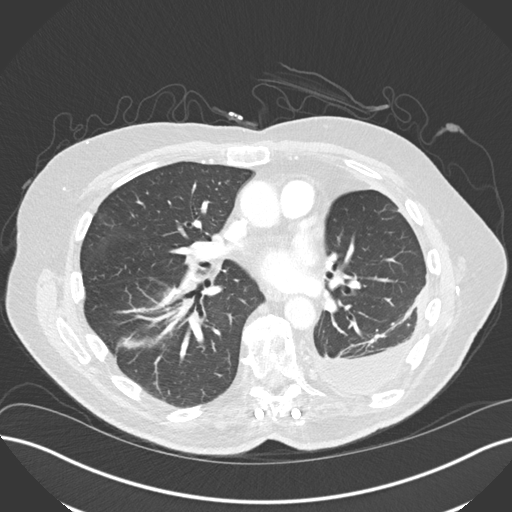
[im 91/151  lung]
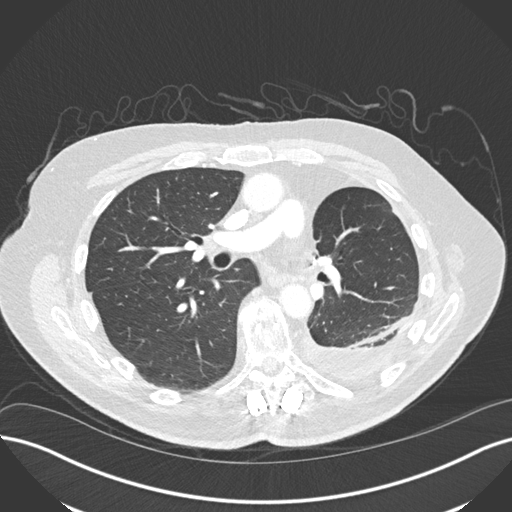
[im 101/151  mediastinal]
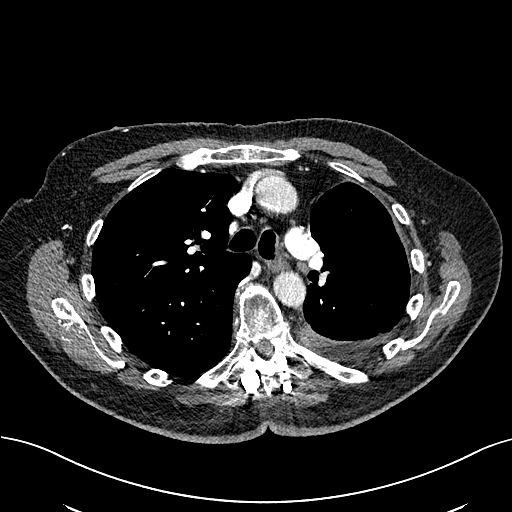
[im 101/151  lung]
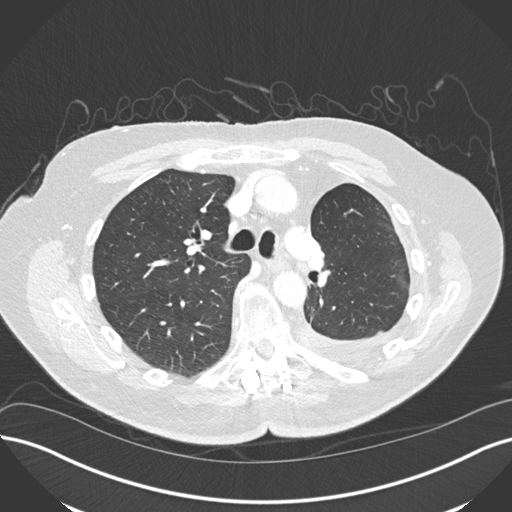
[im 121/151  lung]
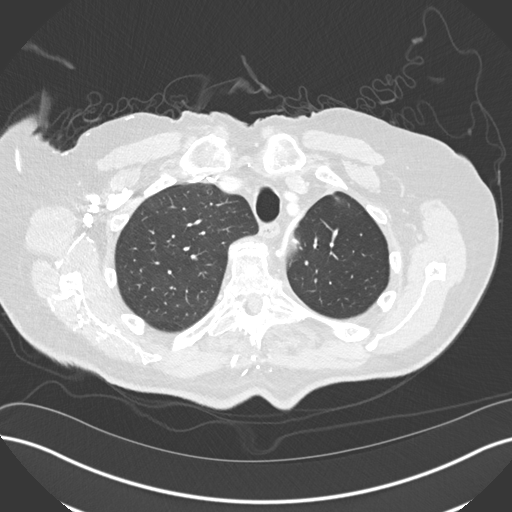
[im 131/151  lung]
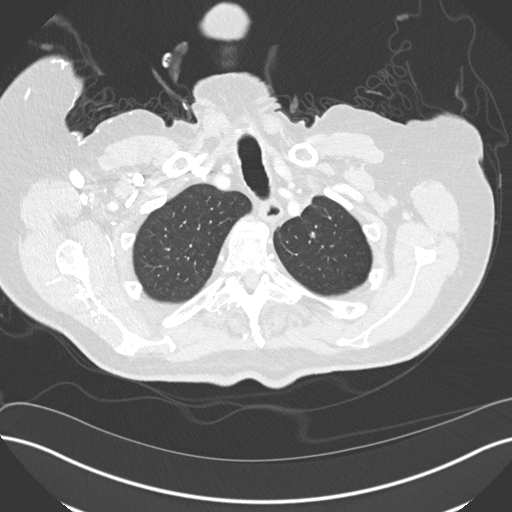
[im 141/151  lung]
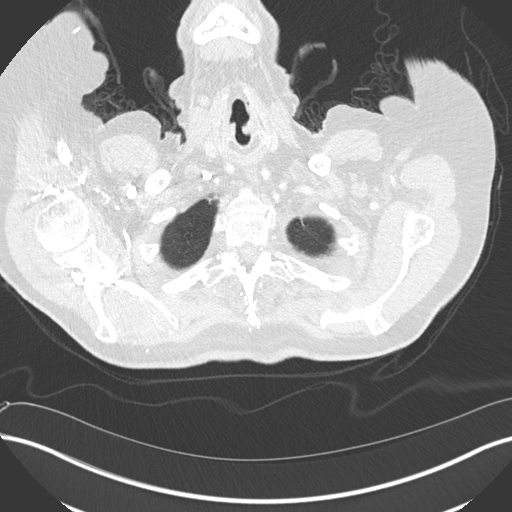

[Series 5: coronal · coronal · 0.61mm/px · 3 of 134 slices shown]
[im 27/134  lung]
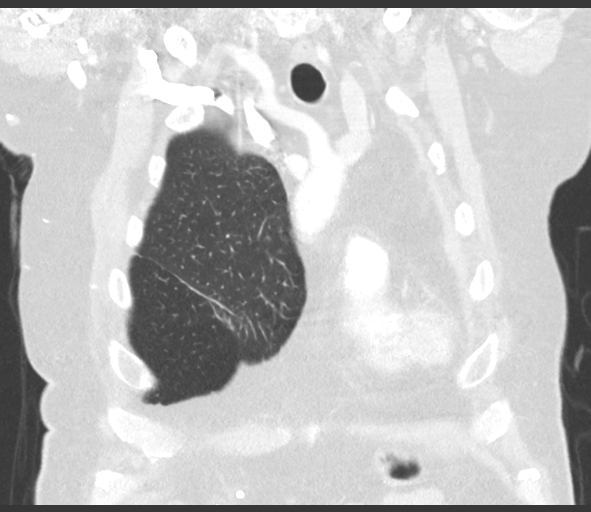
[im 54/134  lung]
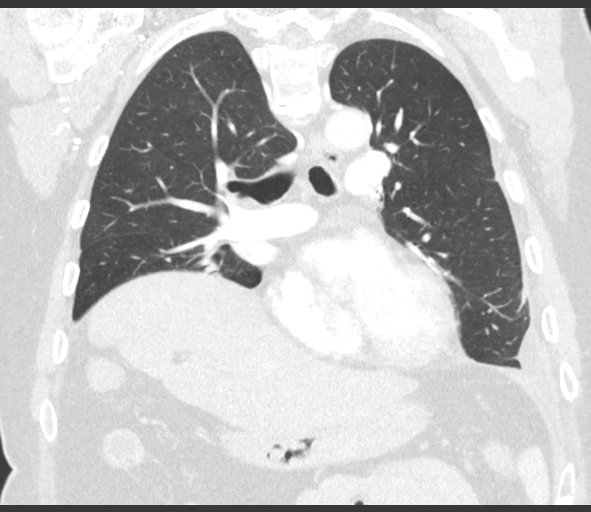
[im 80/134  lung]
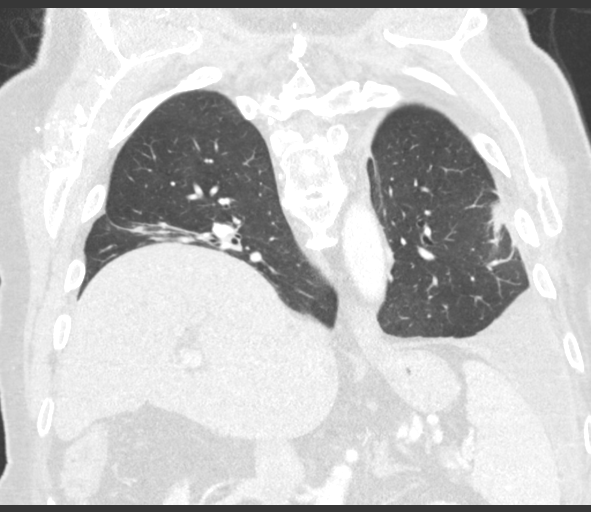

[15 of 36 positions shown; findings below may reference images not displayed]

FINDINGS: Cardiovascular: No significant vascular findings. Normal heart size.
No pericardial effusion.

Mediastinum/Nodes: No enlarged mediastinal, hilar, or axillary lymph
nodes. Thyroid gland, trachea, and esophagus demonstrate no
significant findings.

Lungs/Pleura: Redemonstrated postoperative findings of left upper
lobectomy. Unchanged small, loculated left pleural effusion.
Bandlike scarring and/or partial atelectasis of the right lung base,
unchanged. Stable, benign 4-5 mm pulmonary nodule in the superior
segment left lower lobe (series 7, image 22).

Upper Abdomen: No acute abnormality.

Musculoskeletal: No chest wall mass or suspicious bone lesions
identified.
IMPRESSION: 1. Redemonstrated postoperative findings of left upper lobectomy. No
evidence of malignant recurrence.

2. Stable, benign 4-5 mm pulmonary nodule in the superior segment
left lower lobe (series 7, image 22).

3.  Unchanged small loculated left pleural effusion.

## 2022-03-08 IMAGING — CR DG CHEST 2V
2 series · 2 of 2 positions shown · non-contrast
Comparison: [DATE] [DATE], [DATE], [DATE] [DATE], [DATE]

CLINICAL DATA: Shortness of breath for 2 weeks

EXAM:
CHEST - 2 VIEW

[w chest lat]
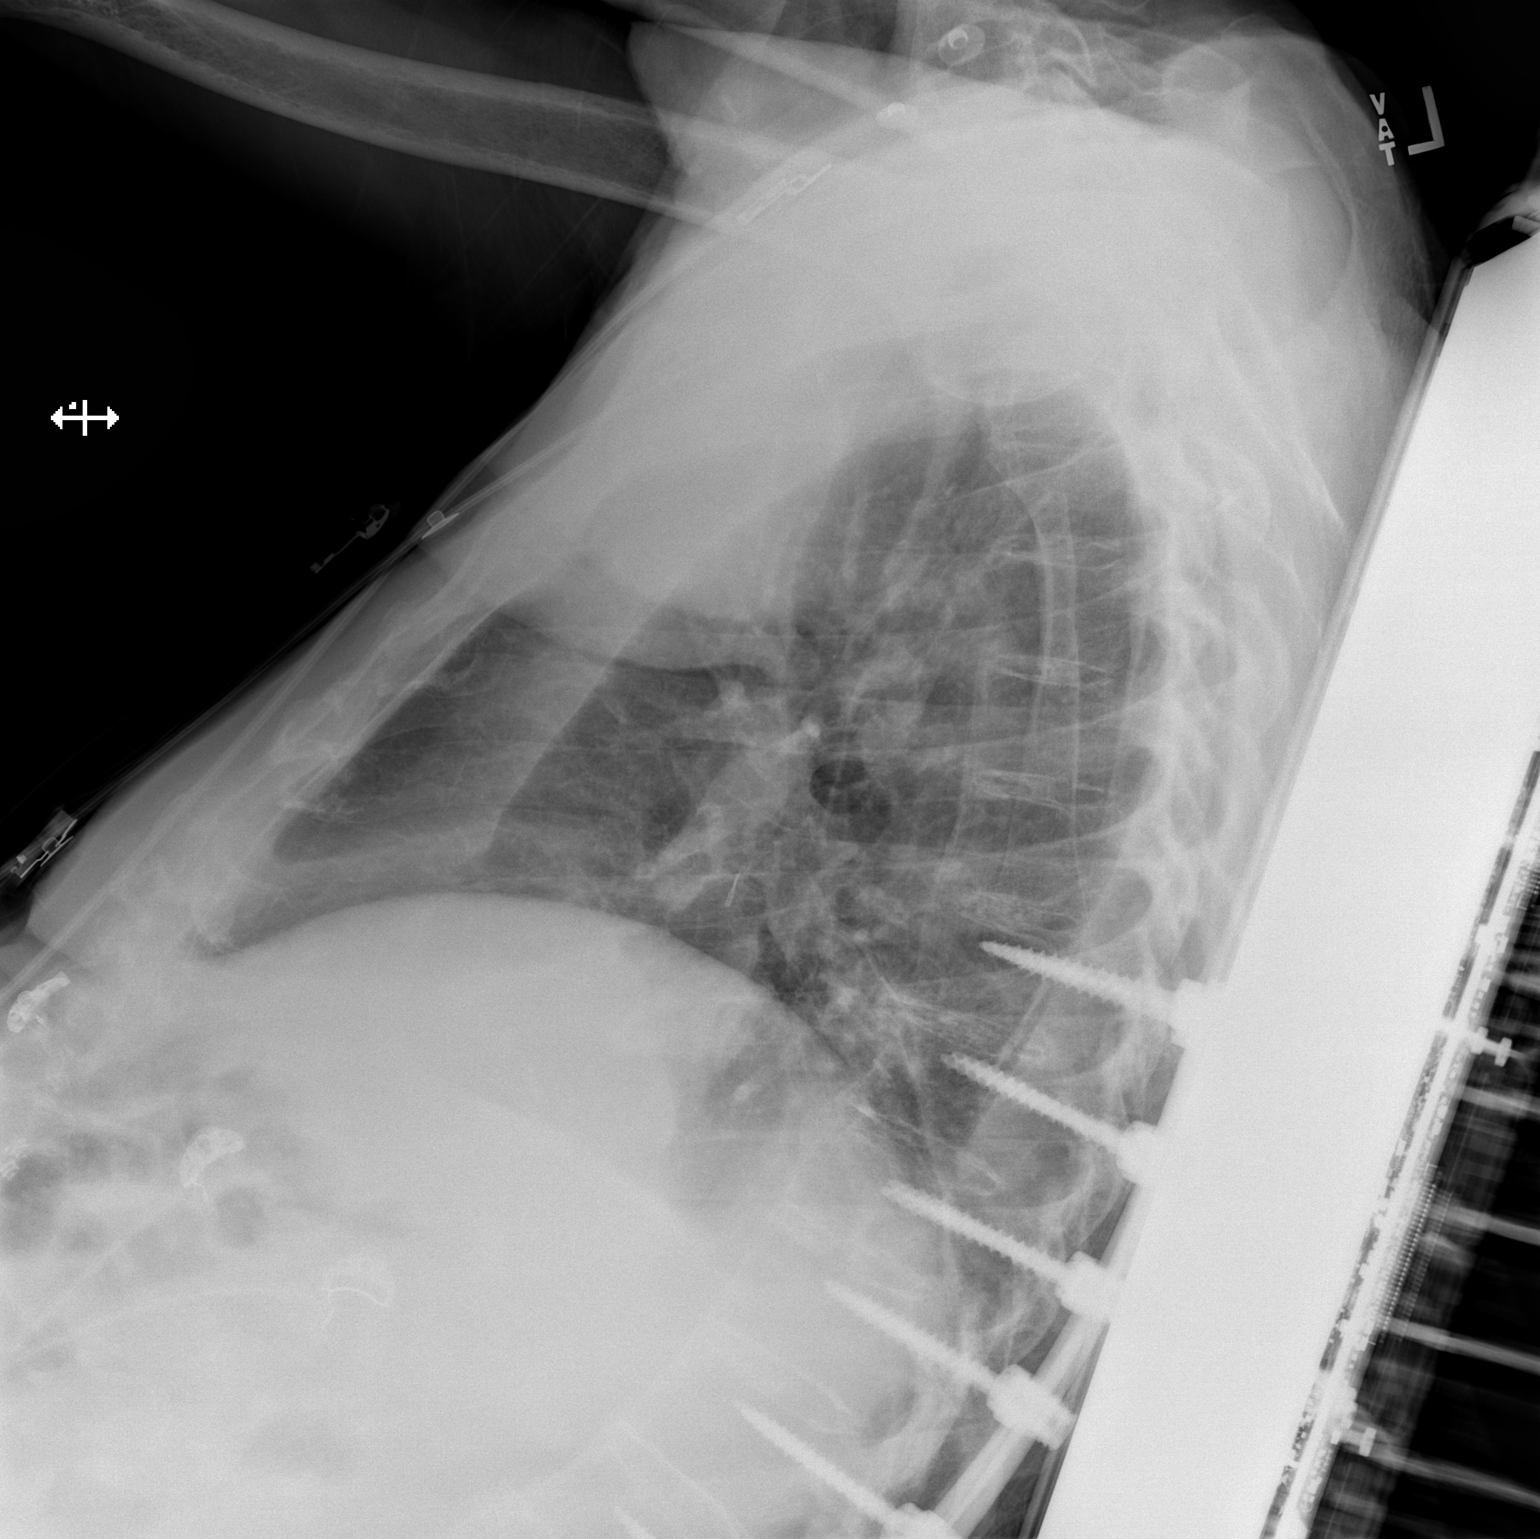

[x chest ap]
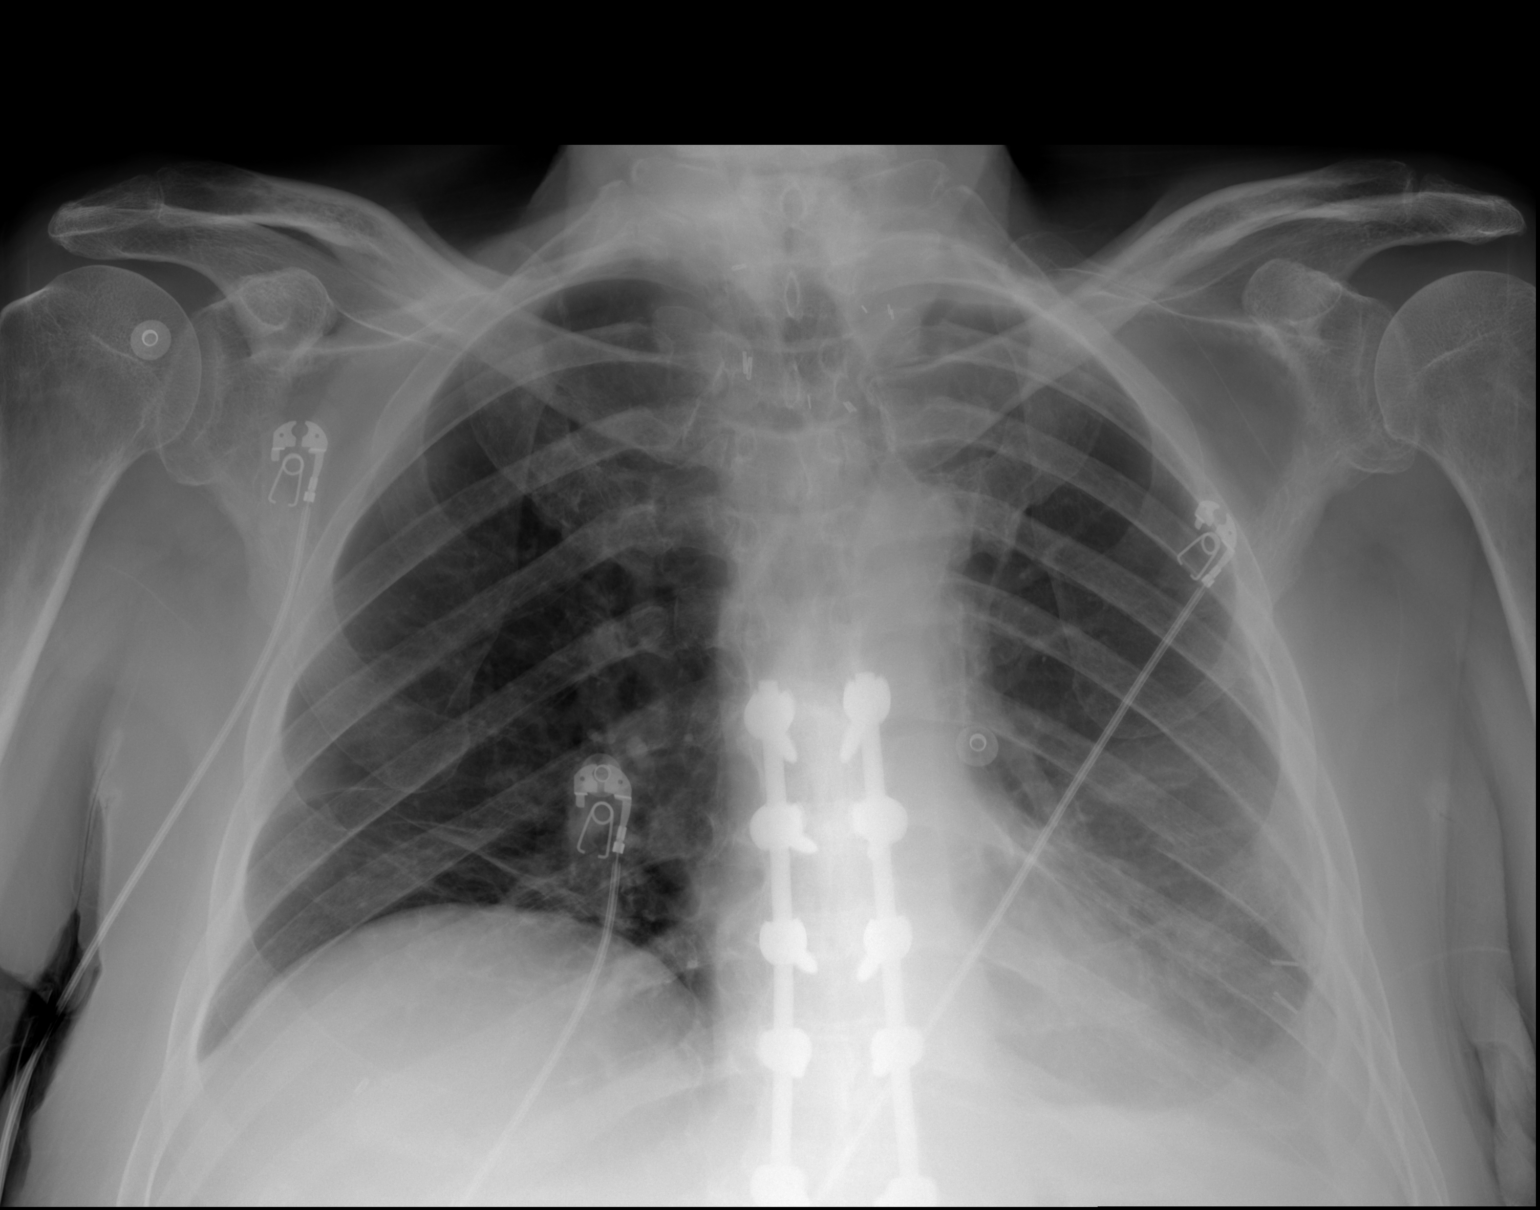

[2 of 2 positions shown; findings below may reference images not displayed]

FINDINGS: The cardiomediastinal silhouette is unchanged in contour. Unchanged
small to moderate LEFT pleural effusion. Surgical clips project over
the upper chest. No pneumothorax. LEFT basilar homogeneous
opacification, likely atelectasis. RIGHT basilar linear opacities,
consistent with atelectasis versus scar. Visualized abdomen is
unremarkable. Status post posterior fusion of the thoracic spine.
IMPRESSION: Unchanged small to moderate LEFT pleural effusion with favored
associated LEFT basilar atelectasis.

## 2022-03-08 IMAGING — DX DG TIBIA/FIBULA 2V*L*
4 series · 4 of 4 positions shown · non-contrast
Comparison: None.

CLINICAL DATA: Left leg swelling.

EXAM:
LEFT TIBIA AND FIBULA - 2 VIEW

[tibia ap (1 of 2)]
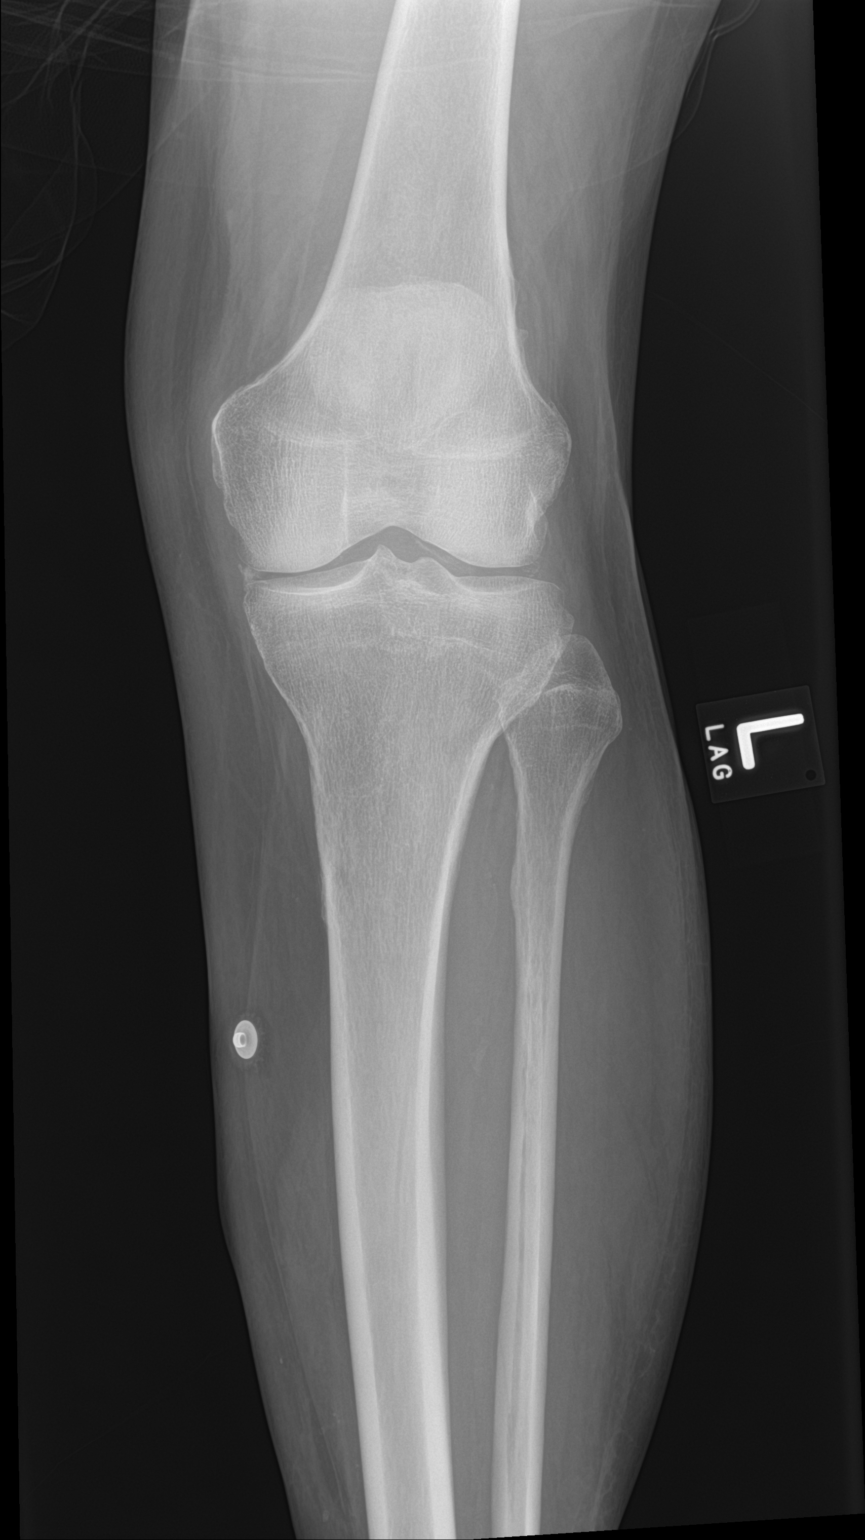

[tibia ap (2 of 2)]
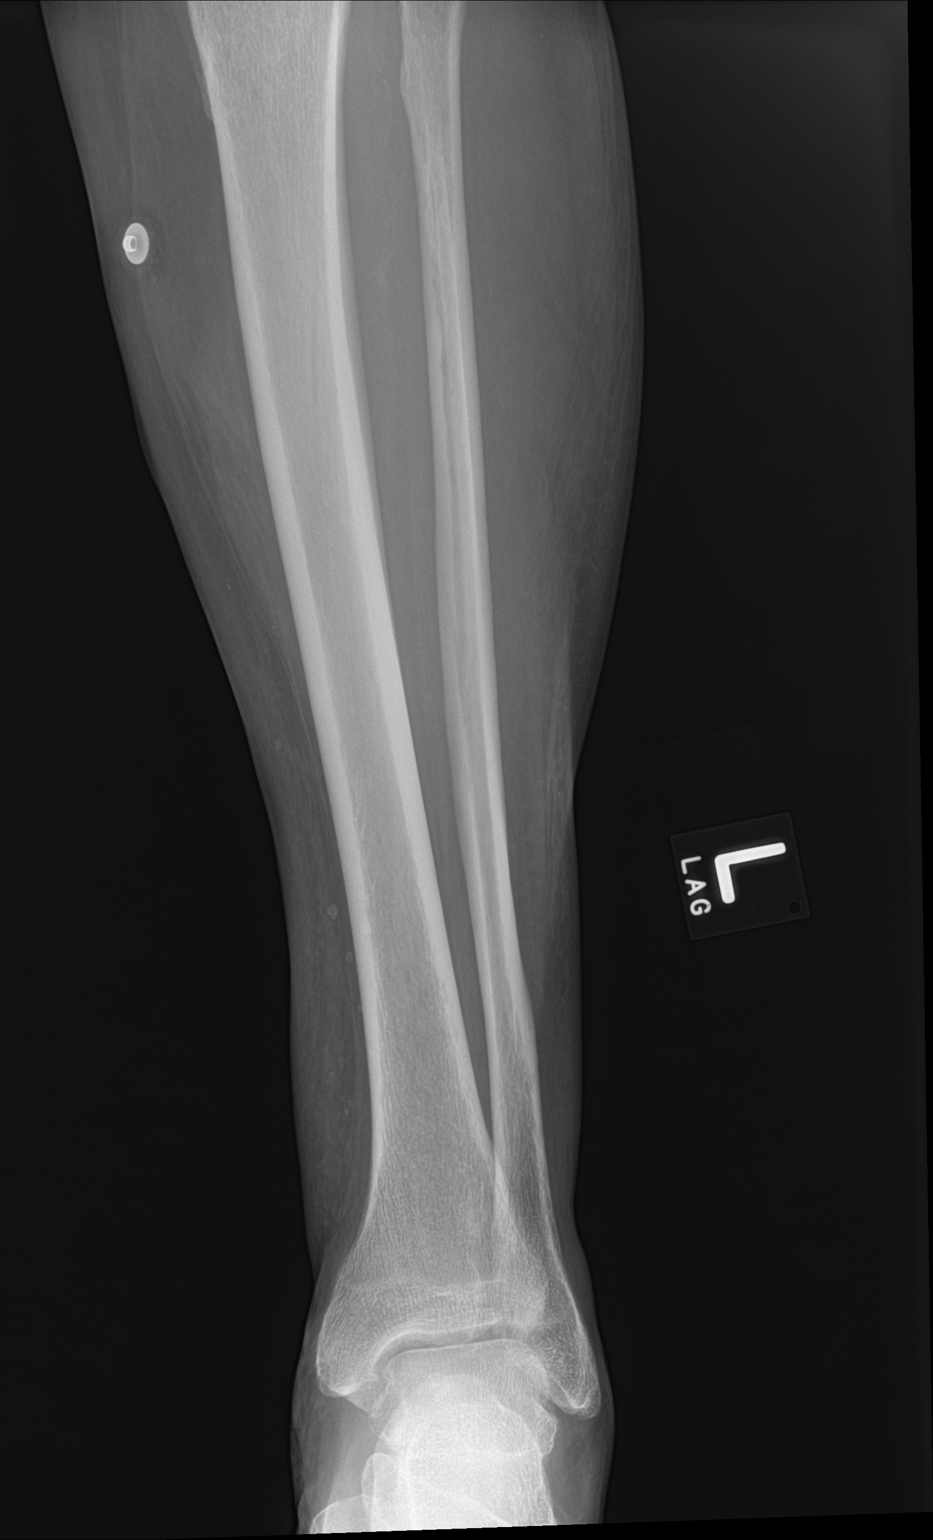

[tibia lat (1 of 2)]
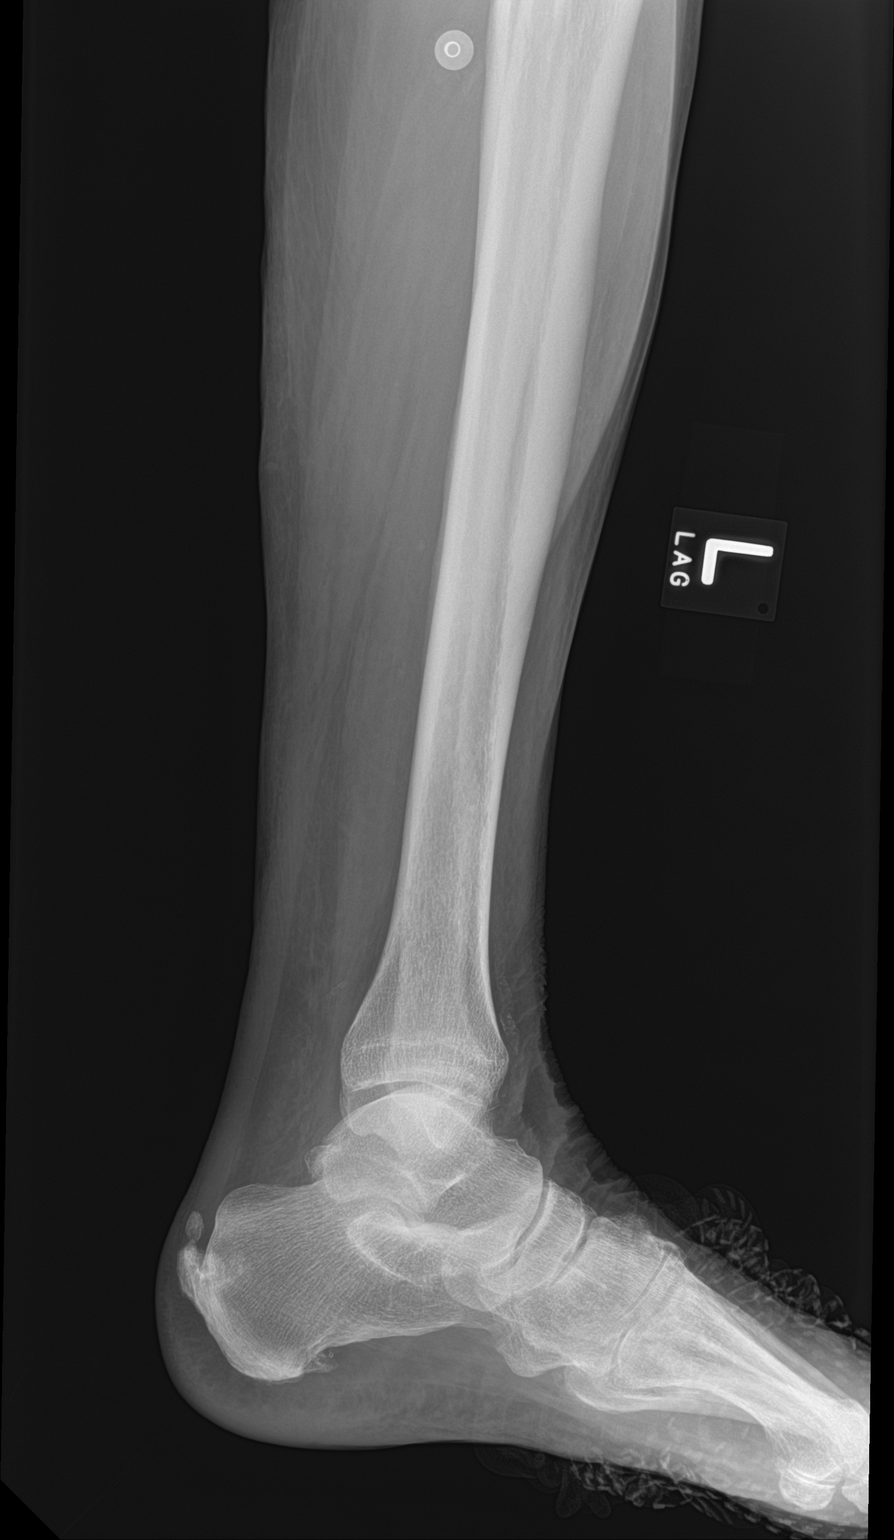

[tibia lat (2 of 2)]
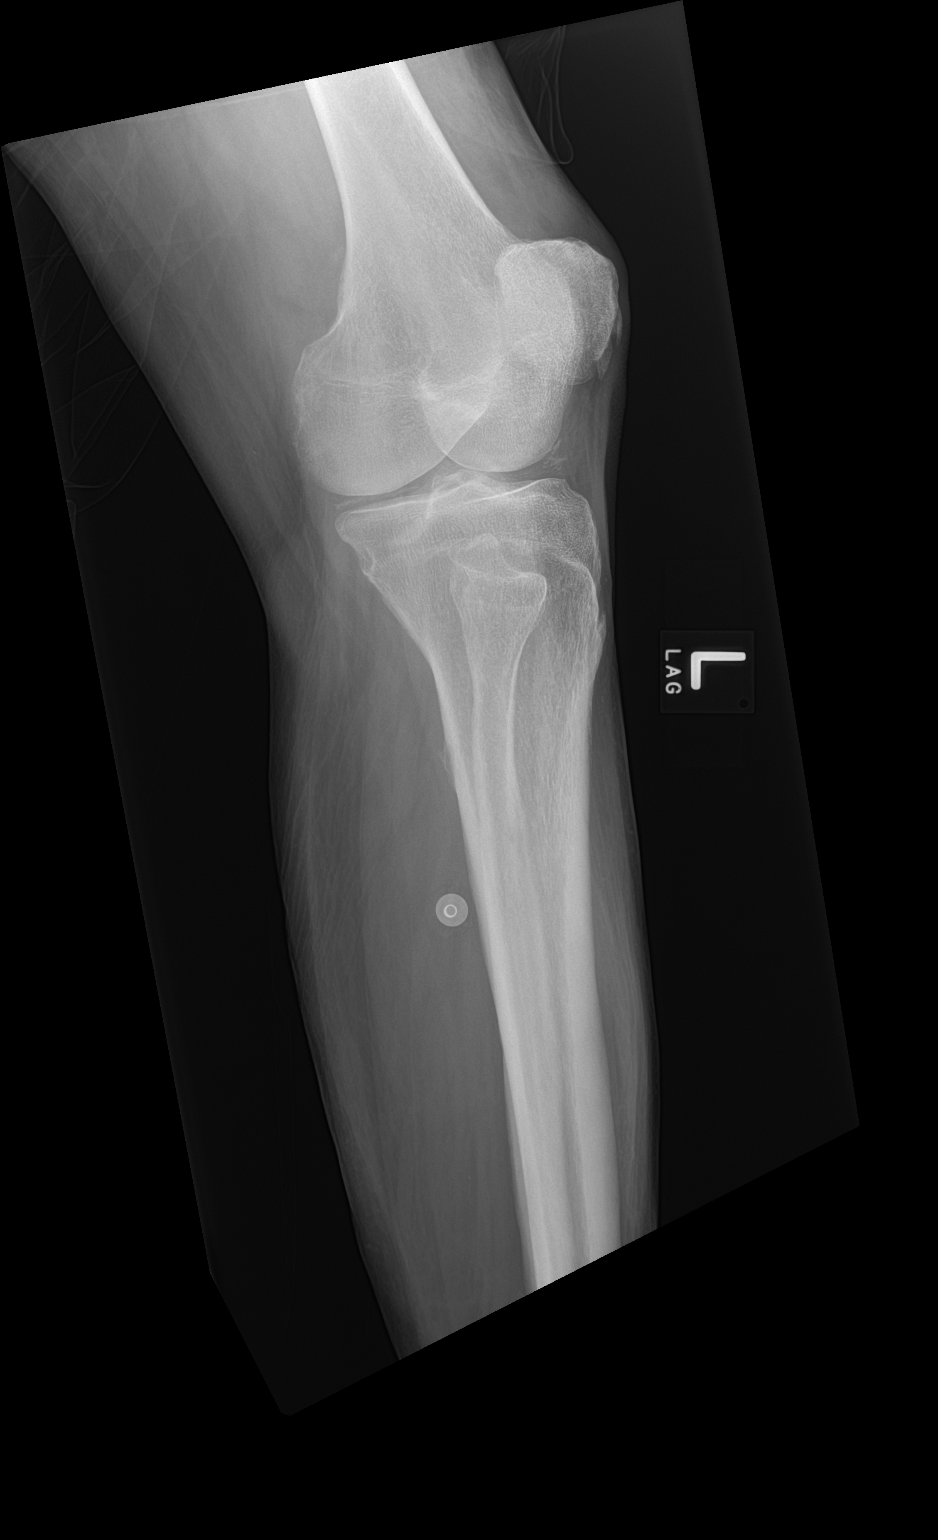

[4 of 4 positions shown; findings below may reference images not displayed]

FINDINGS: Cortical margins of the tibia and fibula are intact. There is no
evidence of fracture or other focal bone lesions. Knee and ankle
alignment are maintained. There is chondrocalcinosis of the knee.
Mild generalized soft tissue edema, nonspecific.
IMPRESSION: 1. Mild generalized soft tissue edema. No osseous abnormality.
2. Chondrocalcinosis of the knee.

## 2022-04-11 IMAGING — DX DG CHEST 1V PORT
1 series · 1 of 1 positions shown · non-contrast
Comparison: 01/04/2020

CLINICAL DATA: [id number]

EXAM:
PORTABLE CHEST 1 VIEW

[chest ap]
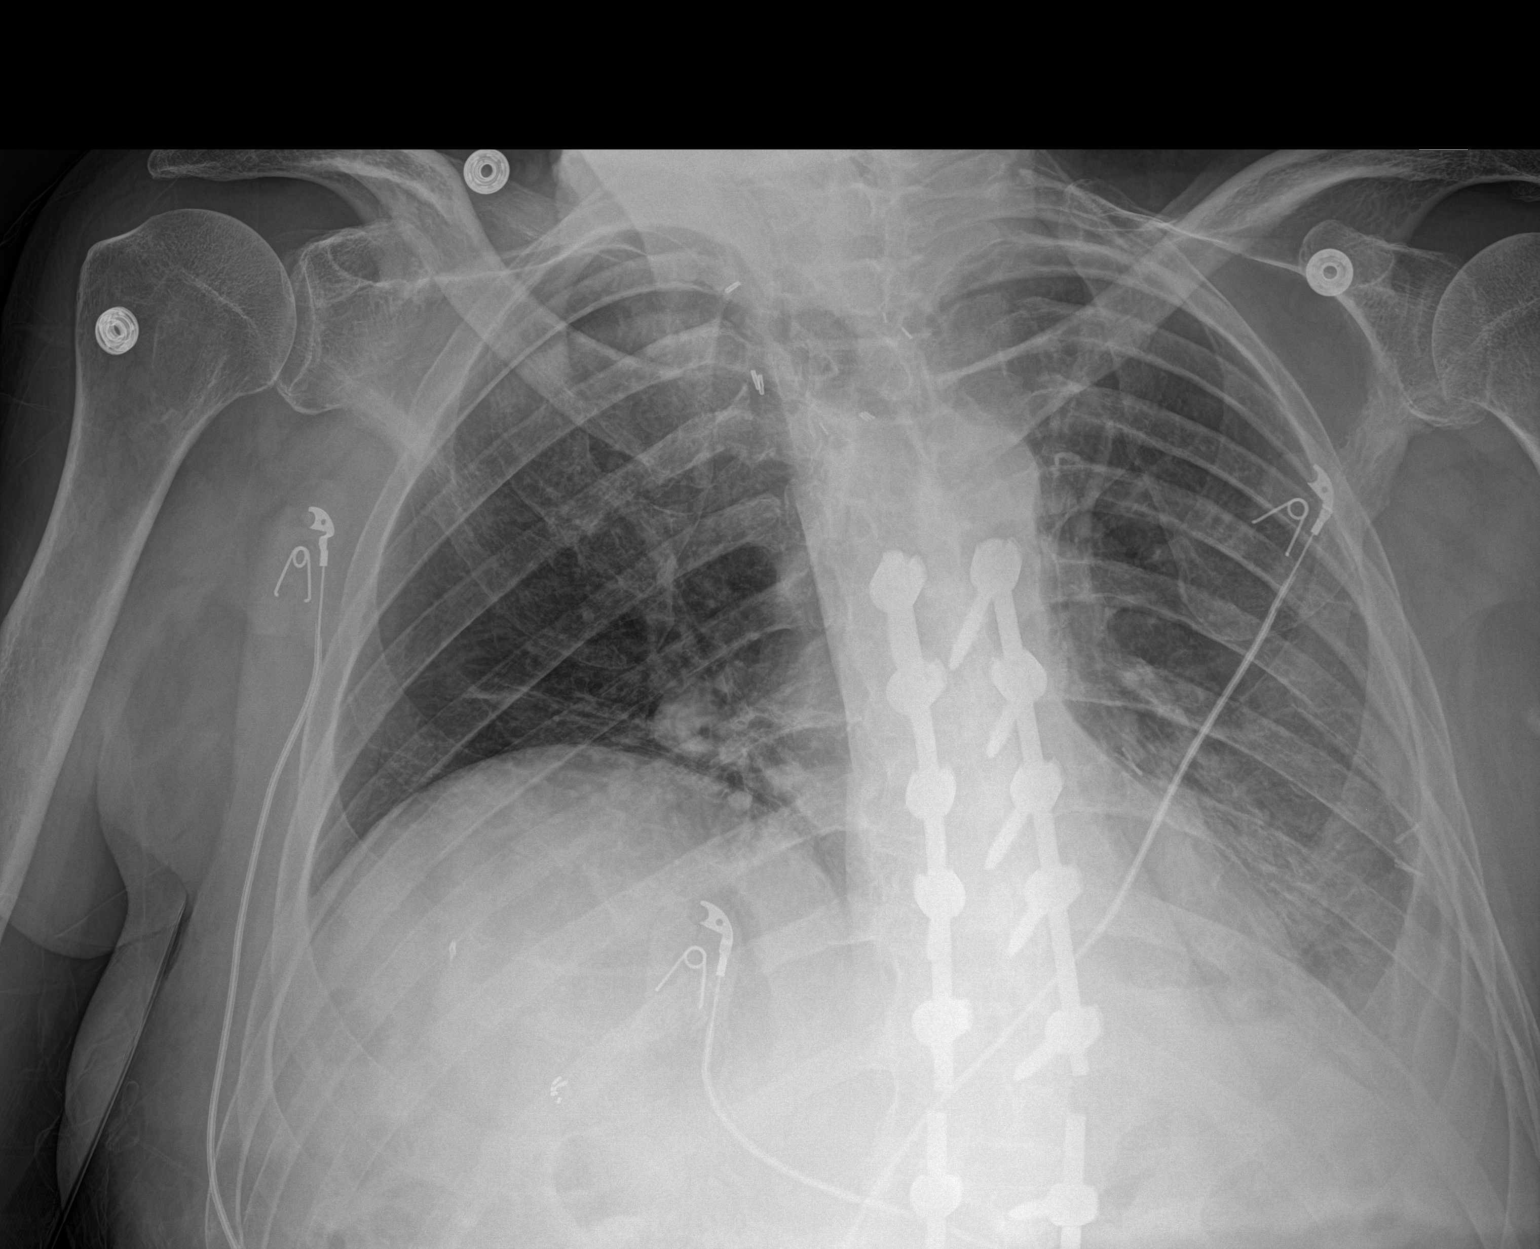

[1 of 1 positions shown; findings below may reference images not displayed]

FINDINGS: Right hemidiaphragm remains elevated. Decreased size of small left
pleural effusion. Improved aeration at the left lung base. No focal
airspace consolidation or pulmonary edema. Normal cardiomediastinal
contours. Spinal rods.
IMPRESSION: Decreased size of small left pleural effusion and improved aeration
of the left lung base.

## 2022-09-05 IMAGING — DX DG CHEST 1V
1 series · 1 of 1 positions shown · non-contrast
Comparison: Portable exam 1324 hours compared to 03/26/2020

CLINICAL DATA: Intubation

EXAM:
CHEST  1 VIEW

[chest ap]
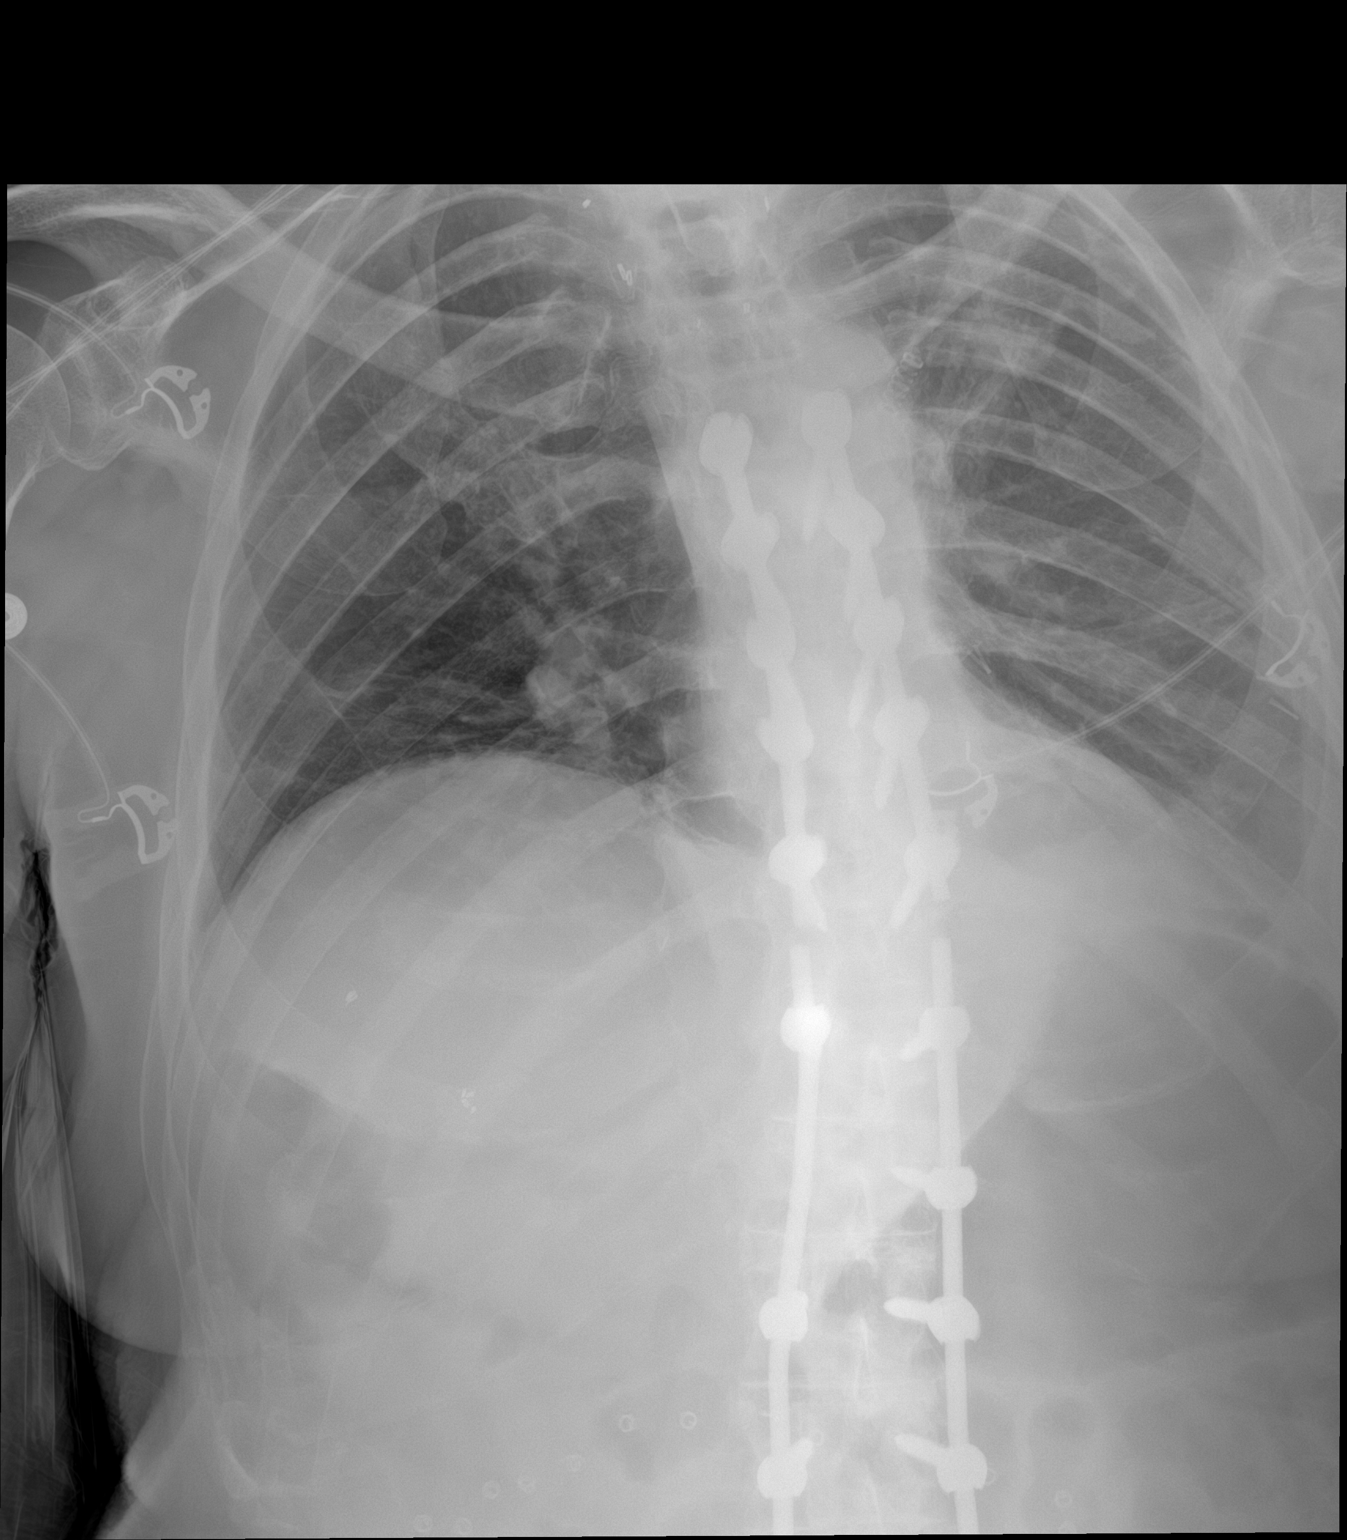

[1 of 1 positions shown; findings below may reference images not displayed]

FINDINGS: Tip of endotracheal tube projects 10.9 cm above carina.

Normal heart size, mediastinal contours, and pulmonary vascularity.

Bibasilar atelectasis and question small LEFT pleural effusion.

Remaining lungs clear.

No pneumothorax.

Osseous demineralization with prior spinal fixation and fracture of
the spinal rods bilaterally at T11-T12.
IMPRESSION: Bibasilar atelectasis and question small LEFT pleural effusion.

## 2022-11-13 IMAGING — DX DG CHEST 1V PORT
2 series · 2 of 2 positions shown · non-contrast
Comparison: Radiographs 07/03/2020 and 03/26/2020.  CT 01/04/2020.

CLINICAL DATA: Recent fall with compression fractures. Altered
mental status.

EXAM:
PORTABLE CHEST 1 VIEW

[chest ap (1 of 2)]
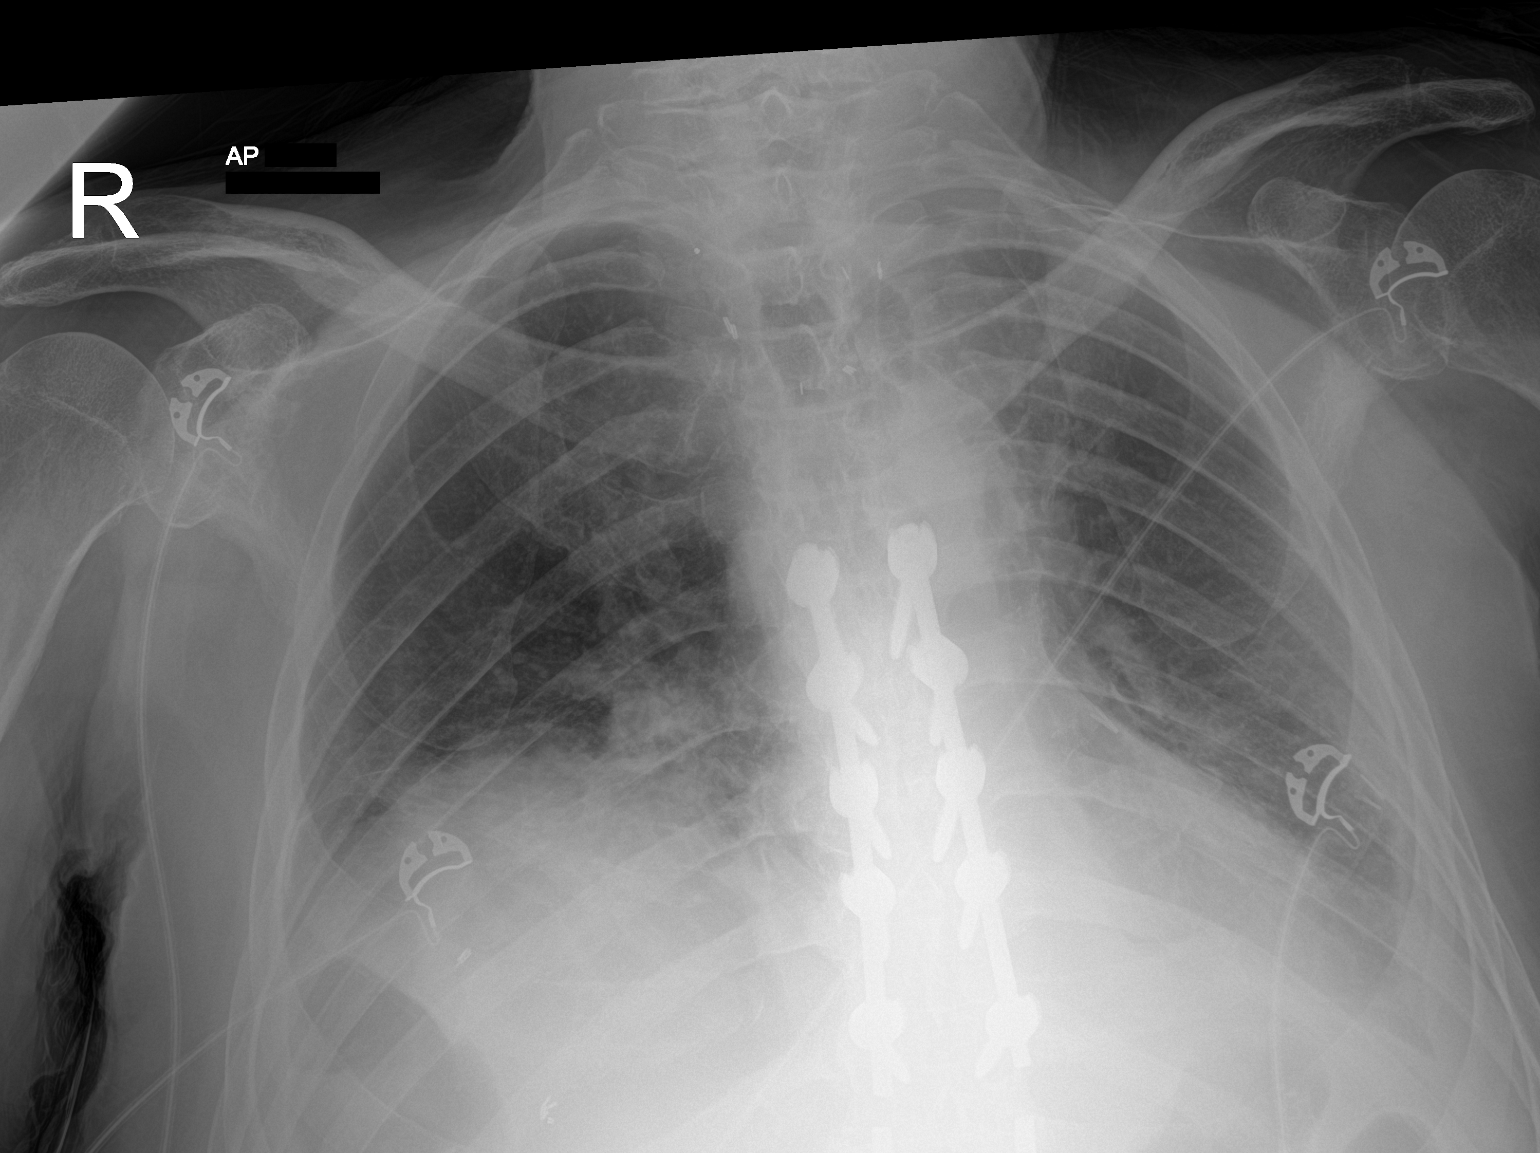

[chest ap (2 of 2)]
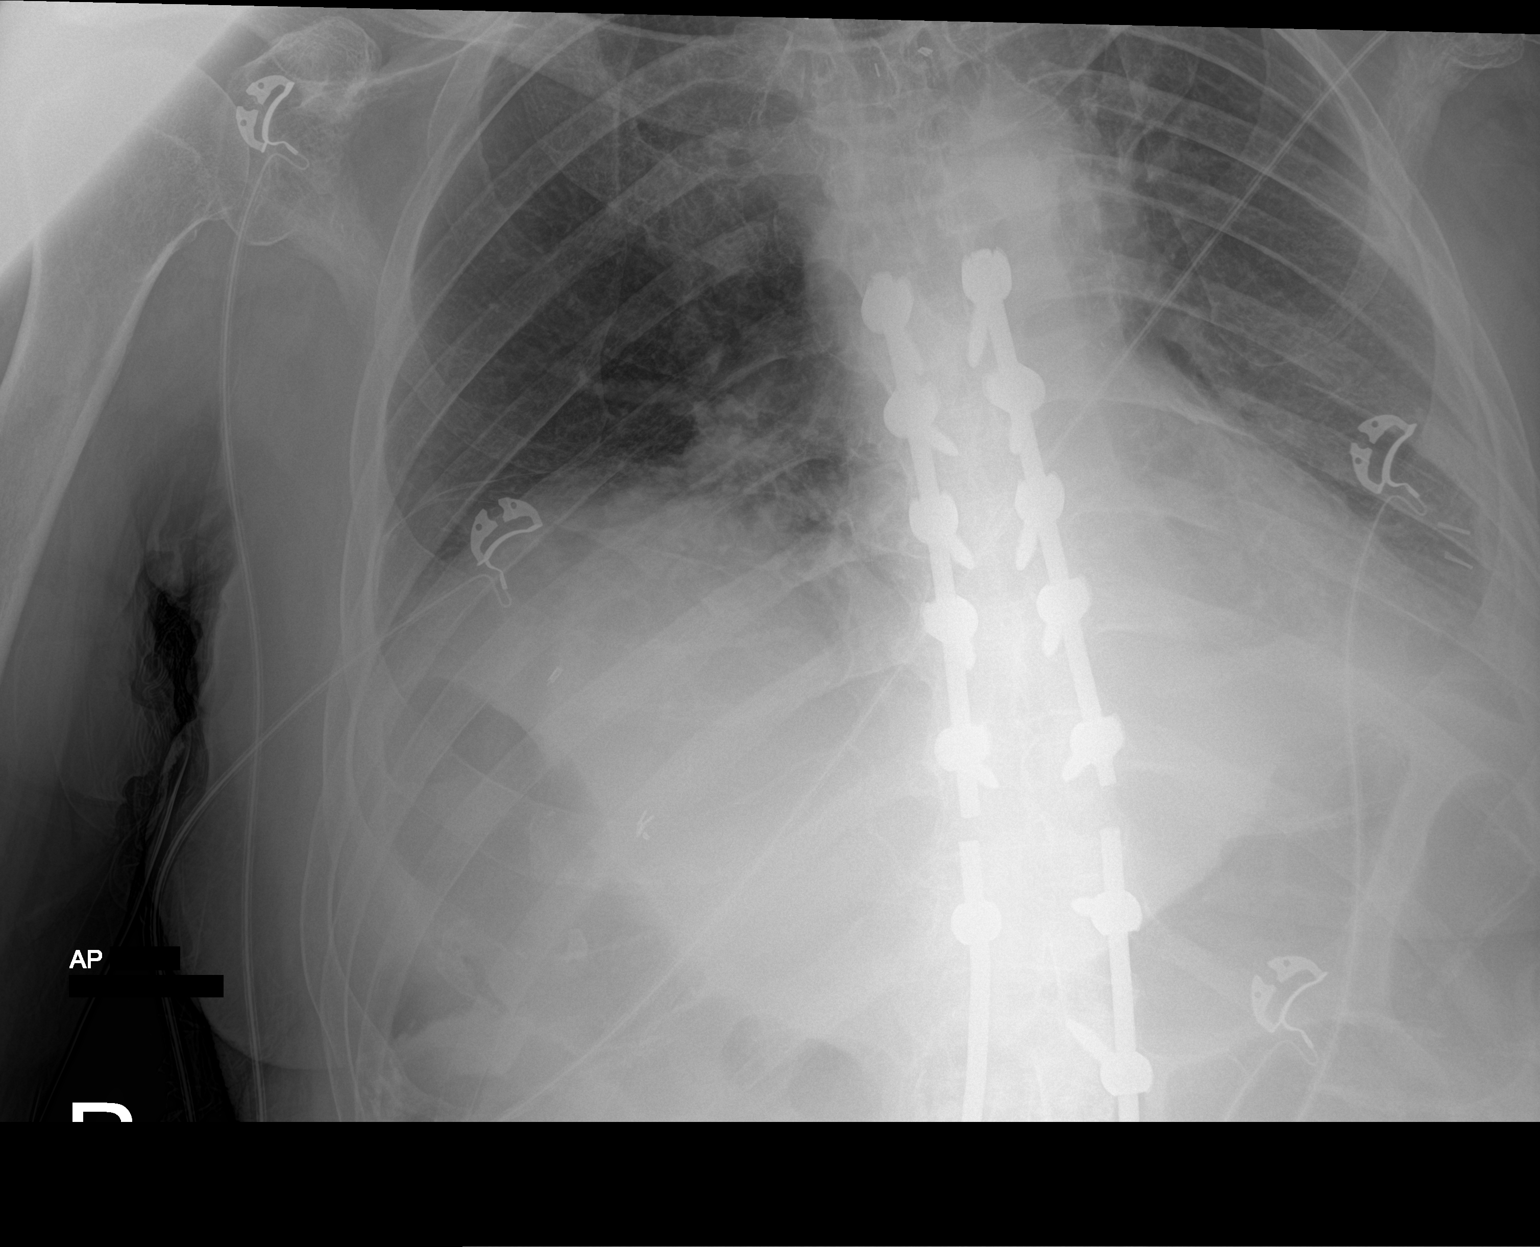

[2 of 2 positions shown; findings below may reference images not displayed]

FINDINGS: 8995 hours. Two views obtained. Persistent low lung volumes.
Interval extubation. The heart size and mediastinal contours are
stable. There are lower lung volumes with increased patchy airspace
opacities at both lung bases. There may be small bilateral pleural
effusions. No evidence of pneumothorax. Patient is status post
thoracolumbar fusion. Both spinal rods are chronically fractured at
the T11-12 level.
IMPRESSION: Increased airspace opacities at both lung bases suspicious for
pneumonia, possibly on the basis of aspiration. Chronic fracture of
the thoracolumbar spinal rods.

## 2022-11-13 IMAGING — DX DG CHEST 1V PORT
1 series · 1 of 1 positions shown · non-contrast
Comparison: Radiographs 09/10/2020 and 07/03/2020.  CT 01/04/2020.

CLINICAL DATA: Intubation and orogastric tube placement.

EXAM:
PORTABLE CHEST 1 VIEW

[chest ap]
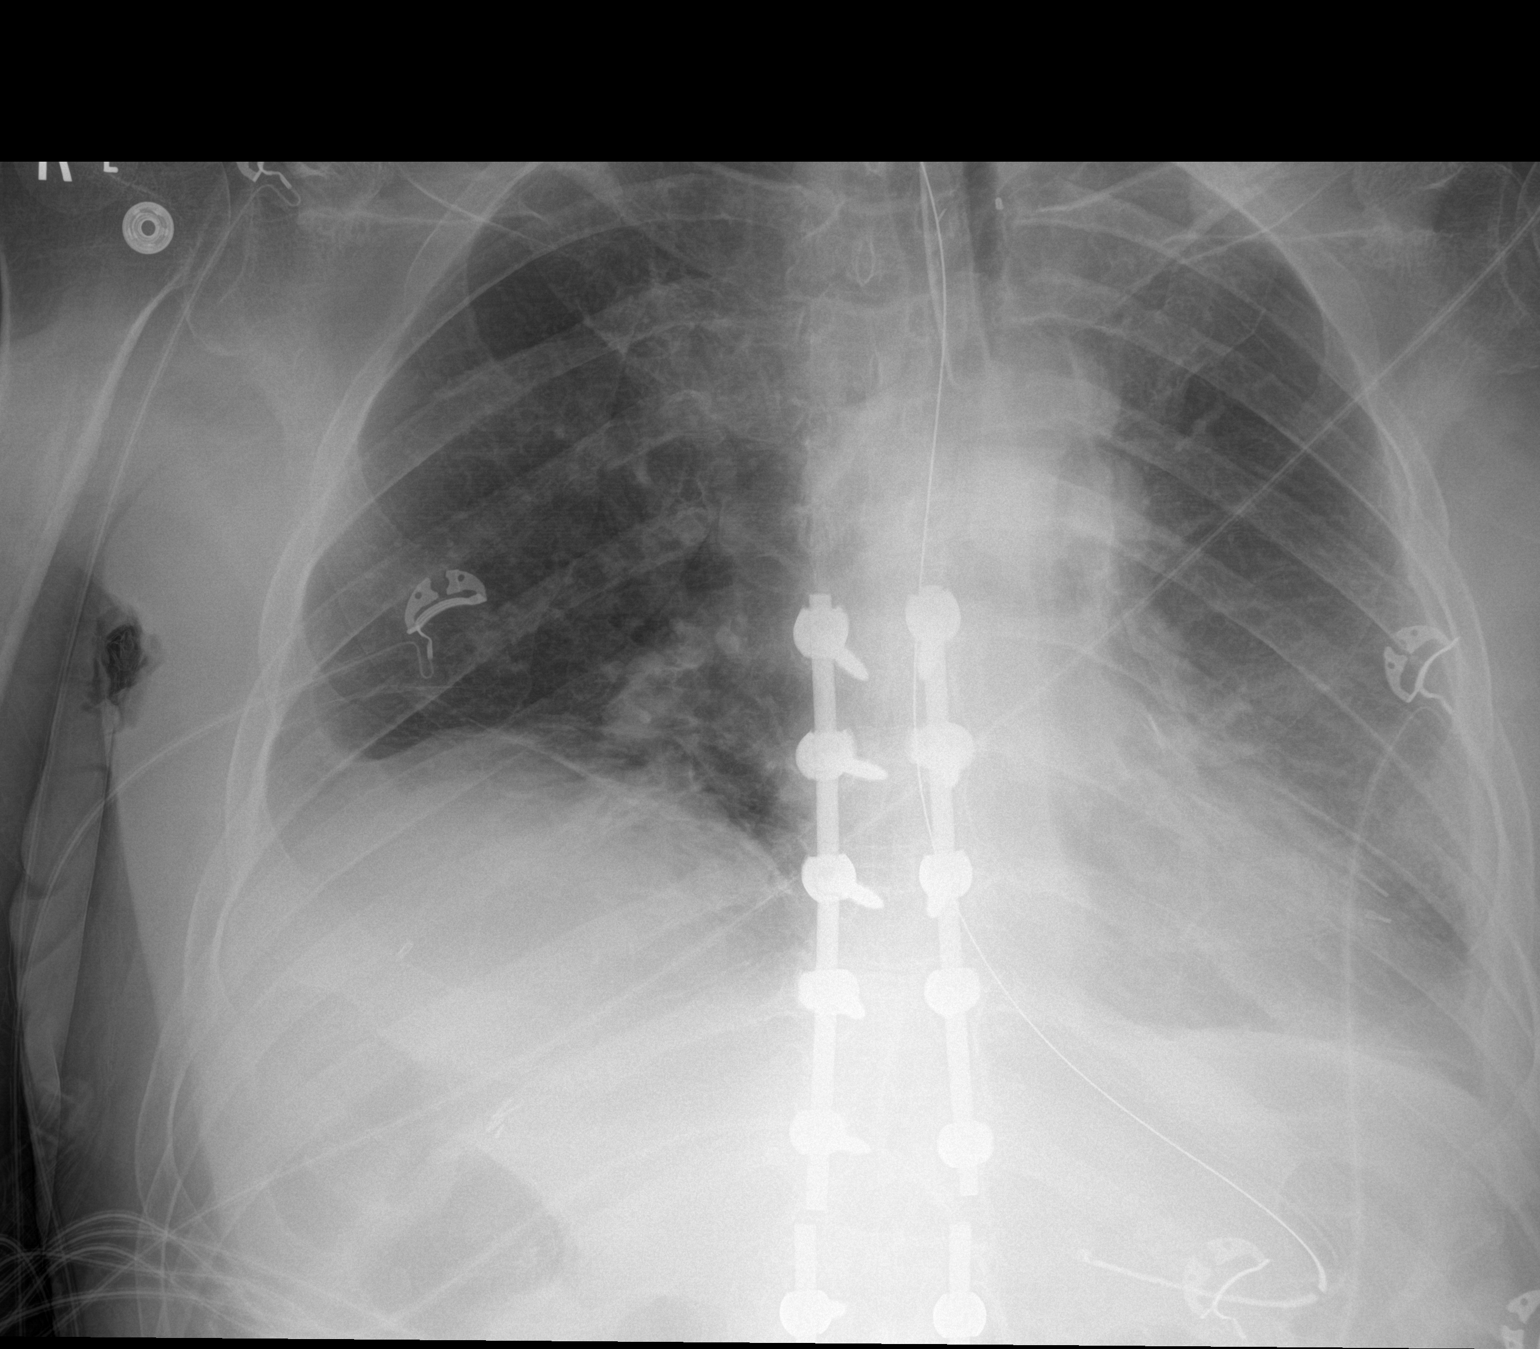

[1 of 1 positions shown; findings below may reference images not displayed]

FINDINGS: 9151 hours. Interval intubation with tip of the endotracheal tube
approximately 2.7 cm above the carina. Enteric tube projects below
the diaphragm tip overlying the proximal stomach. The heart size and
mediastinal contours are stable. There is slightly improved aeration
of the lung bases with persistent bibasilar airspace opacities and
probable small pleural effusions. No pneumothorax.
IMPRESSION: Satisfactory position of the endotracheal and enteric tubes.
Slightly improved aeration of the lungs.

## 2022-11-13 IMAGING — CT CT ANGIO CHEST
2 of 6 series · 18 of 36 positions shown · IV contrast (OMNIPAQUE 350)
Comparison: CT 01/04/2020

CLINICAL DATA: Worsening hypoxia. Lung cancer. Question pulmonary
embolism

EXAM:
CT ANGIOGRAPHY CHEST WITH CONTRAST
TECHNIQUE: Multidetector CT imaging of the chest was performed using the
standard protocol during bolus administration of intravenous
contrast. Multiplanar CT image reconstructions and MIPs were
obtained to evaluate the vascular anatomy.
CONTRAST:  100mL OMNIPAQUE IOHEXOL 350 MG/ML SOLN

[Series 4: thins · axial · 0.78mm/px · z∈[-818,-540]mm · 17 of 315 slices shown]
[im 18/315  lung]
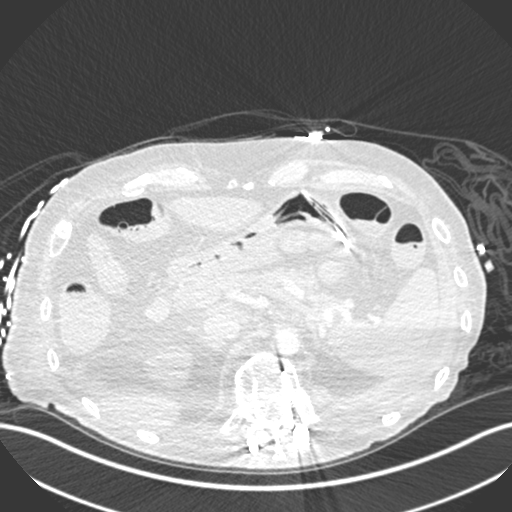
[im 35/315  mediastinal]
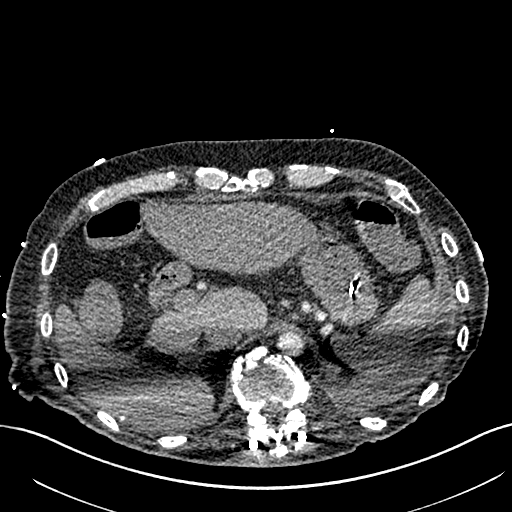
[im 53/315  lung]
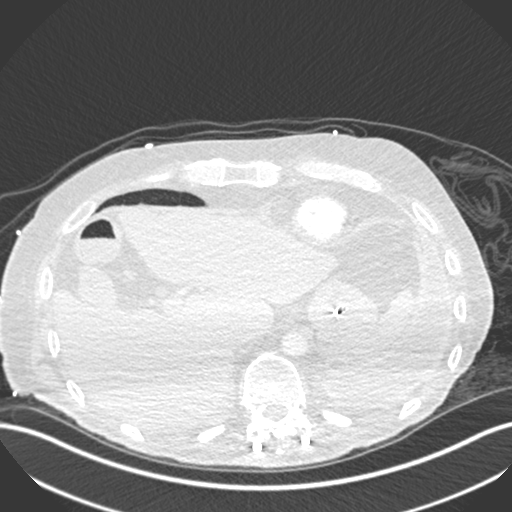
[im 70/315  mediastinal]
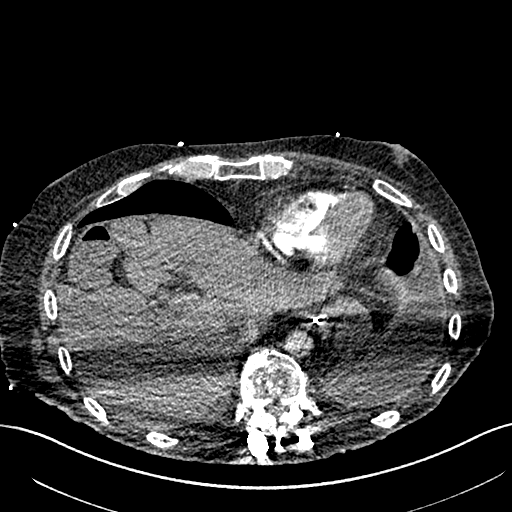
[im 88/315  lung]
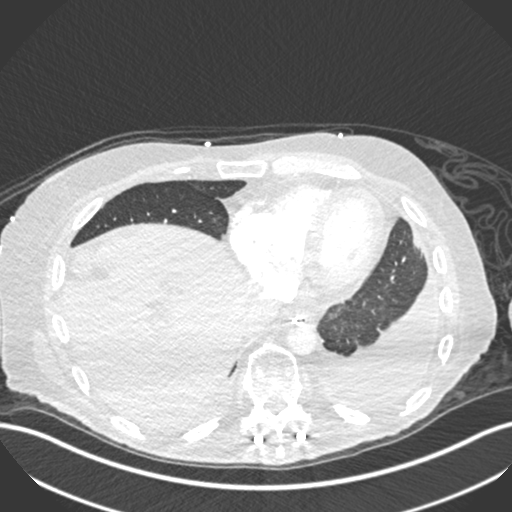
[im 105/315  mediastinal]
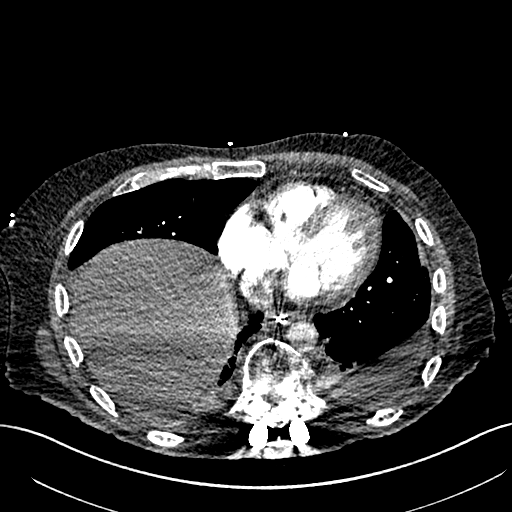
[im 123/315  lung]
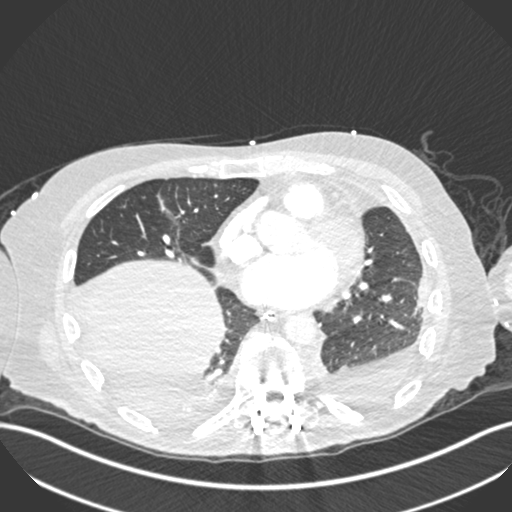
[im 140/315  mediastinal]
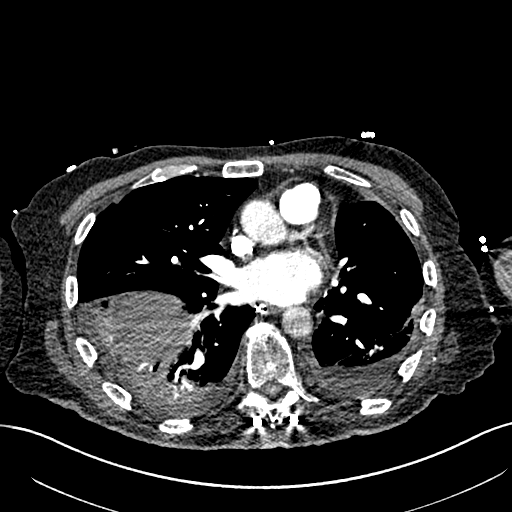
[im 158/315  lung]
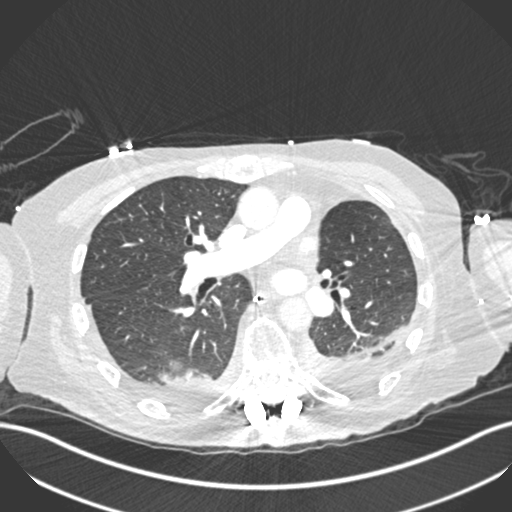
[im 175/315  mediastinal]
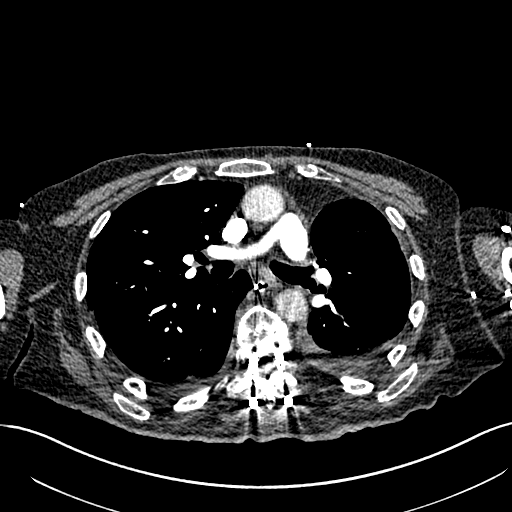
[im 192/315  lung]
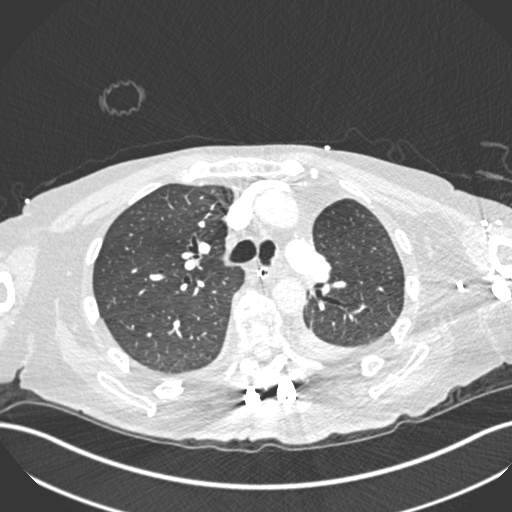
[im 210/315  mediastinal]
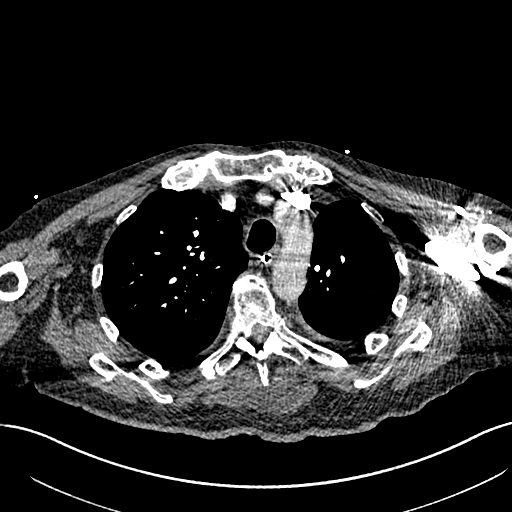
[im 227/315  lung]
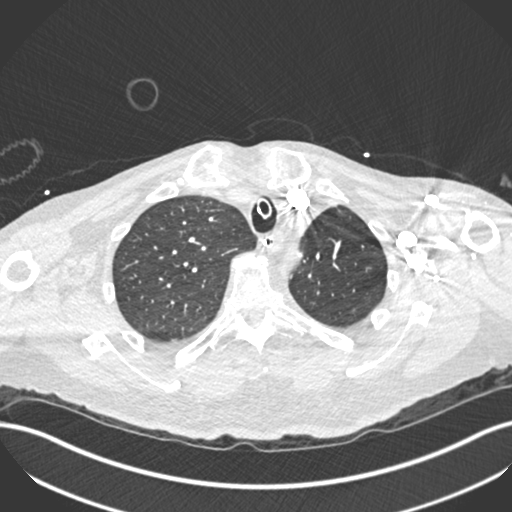
[im 245/315  mediastinal]
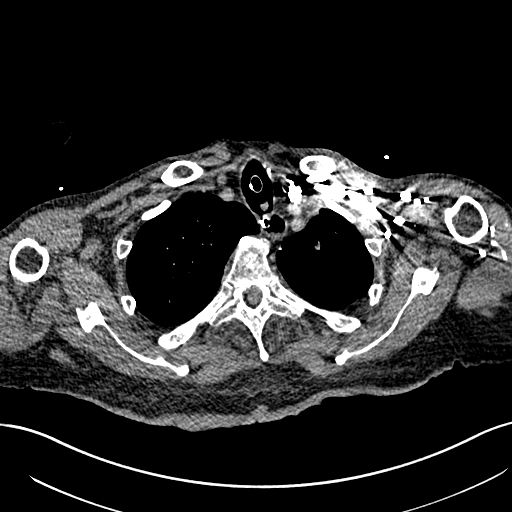
[im 262/315  lung]
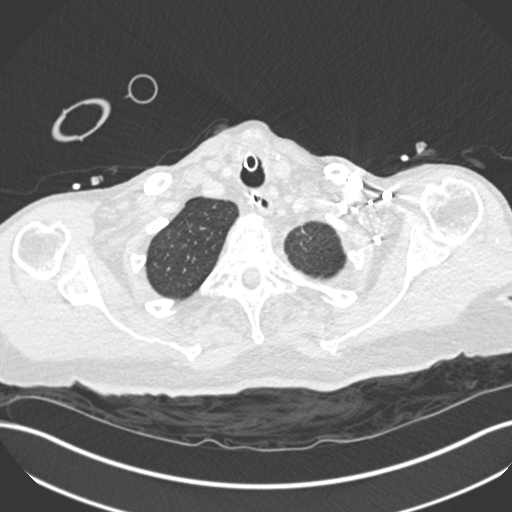
[im 280/315  mediastinal]
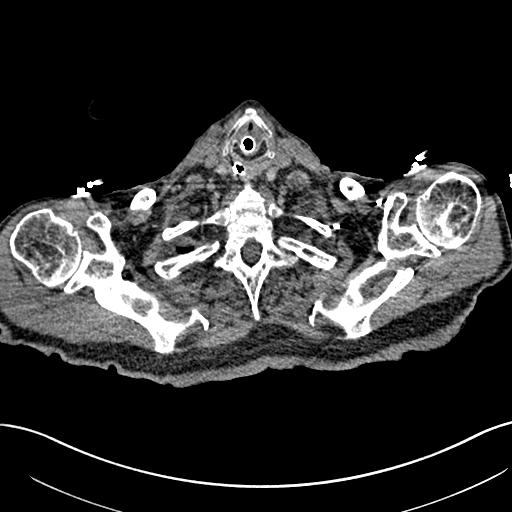
[im 297/315  lung]
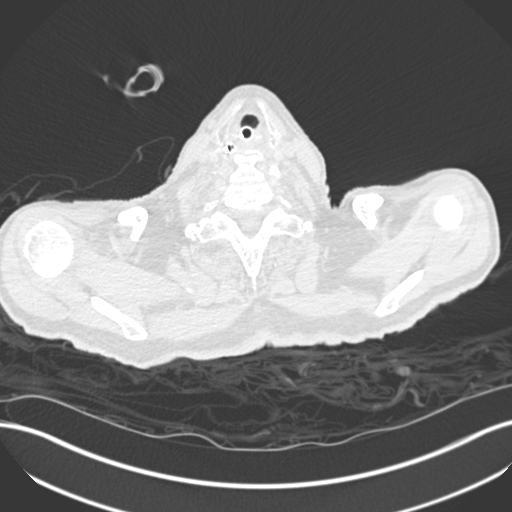

[Series 6: coronal mpr · coronal · 0.64mm/px · 1 of 126 slices shown]
[im 63/126  mediastinal]
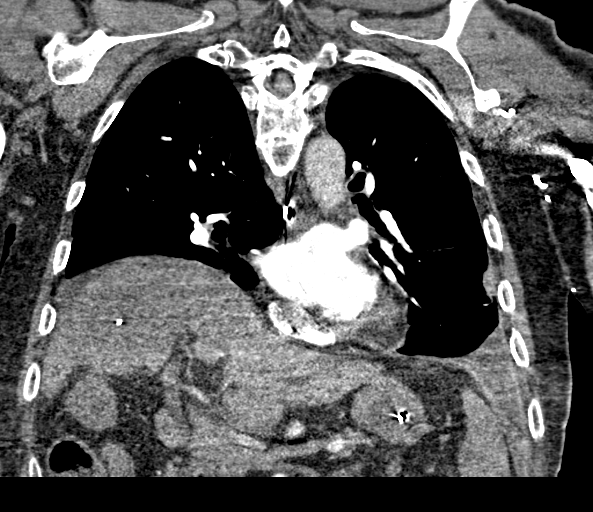

[18 of 36 positions shown; findings below may reference images not displayed]

FINDINGS: Cardiovascular: The pulmonary arteries are well opacified with
contrast to the level of the subsegmental branches. There is no
evidence of acute pulmonary embolism. No significant systemic
arterial abnormalities are identified, although enhancement of the
aortic lumen is limited. The heart size is normal. There is no
pericardial effusion.

Mediastinum/Nodes: There are no enlarged mediastinal, hilar or
axillary lymph nodes.Endotracheal tube terminates in the distal
trachea. An enteric tube projects into the mid stomach. The thyroid
gland, trachea and esophagus demonstrate no significant findings.

Lungs/Pleura: There are small left greater than right pleural
effusions with interval enlargement of the right pleural effusion.
No pneumothorax. Previous left upper lobectomy. 5 mm nodule in the
superior segment of the left lower lobe is unchanged (image 36/5).
No new or enlarging nodules. There is increased compressive
atelectasis in the right lower lobe.

Upper abdomen:  Abdominal findings are dictated separately

Musculoskeletal/Chest wall: Status post thoracolumbar rod fusion.
Both spinal rods are chronically fractured at the T11-12 level.
There is a new horizontal fracture plane through the inferior aspect
of the T11 vertebral body, best seen on the sagittal images. The
posterior extension of this fracture through the posterior elements
is not well seen, although this is likely an unstable, Chance-like
fracture in this patient with a fused spine.

Review of the MIP images confirms the above findings.
IMPRESSION: 1. No evidence of acute pulmonary embolism or other acute vascular
findings.
2. Interval enlarged right pleural effusion with increased right
lower lobe atelectasis. Stable left pleural effusion.
3. Stable small left lower lobe pulmonary nodule. No evidence of
local recurrence or metastatic disease.
4. New horizontal fracture through the inferior aspect of the T11
vertebral body suspicious for an unstable, Chance-like fracture in
this patient who has been previously fused. The spinal rods are
chronically fractured posteriorly at the T11-12 level.
5. Critical Value/emergent results were called by telephone at the
time of interpretation on 09/10/2020 at [DATE] to Dr Nazareth, who
verbally acknowledged these results.

## 2022-11-13 IMAGING — DX DG ABDOMEN 1V
1 series · 1 of 1 positions shown · non-contrast
Comparison: Radiograph 12/15/2019.

CLINICAL DATA: Orogastric tube placement.

EXAM:
ABDOMEN - 1 VIEW

[abdomen kub]
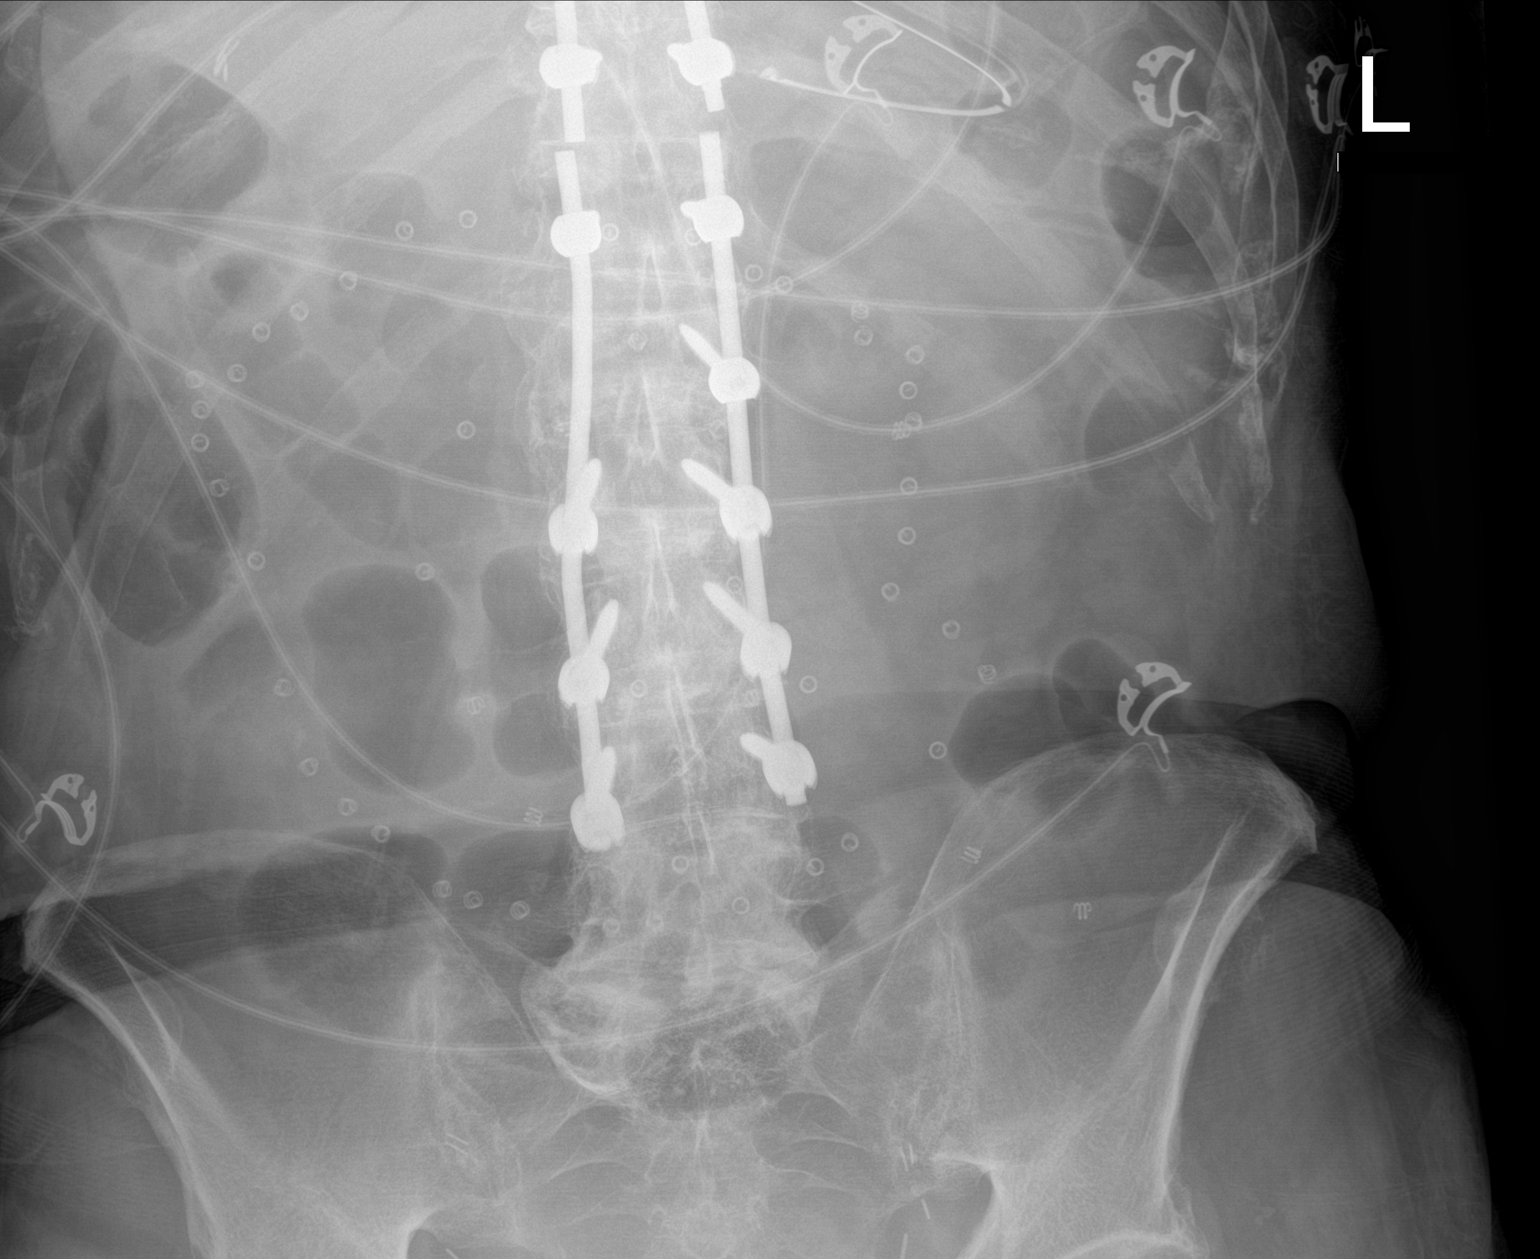

[1 of 1 positions shown; findings below may reference images not displayed]

FINDINGS: 9276 hours. Tip of the endotracheal tube overlies the left upper
quadrant of the abdomen, consistent with position in the proximal
stomach. The visualized bowel gas pattern is nonobstructive. There
are postsurgical changes related to ventral hernia repair and
thoracolumbar fusion. The spinal rods are chronically fractured at
the T11-12 level bilaterally.
IMPRESSION: Enteric tube tip in the left upper quadrant the abdomen consistent
with position in the proximal stomach.

## 2022-11-13 IMAGING — CT CT HEAD W/O CM
3 series · 14 of 47 positions shown, 16 images · non-contrast
Comparison: March 13, 2015

CLINICAL DATA: Status post fall.

EXAM:
CT HEAD WITHOUT CONTRAST
TECHNIQUE: Contiguous axial images were obtained from the base of the skull
through the vertex without intravenous contrast.

[Series 6: head wo · axial · 0.47mm/px · z∈[-448,-298]mm · 8 of 36 slices shown, 10 images]
[im 3/36  brain]
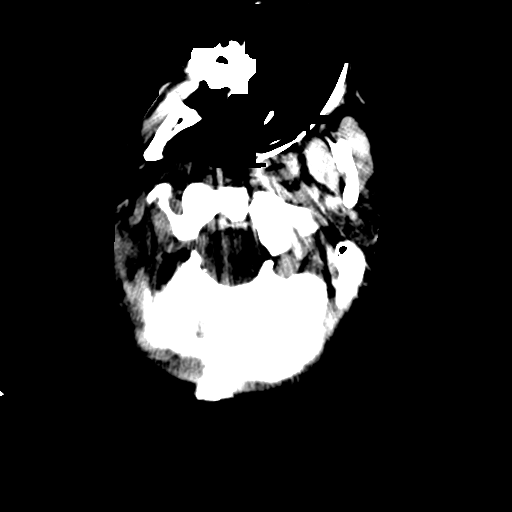
[im 3/36  bone]
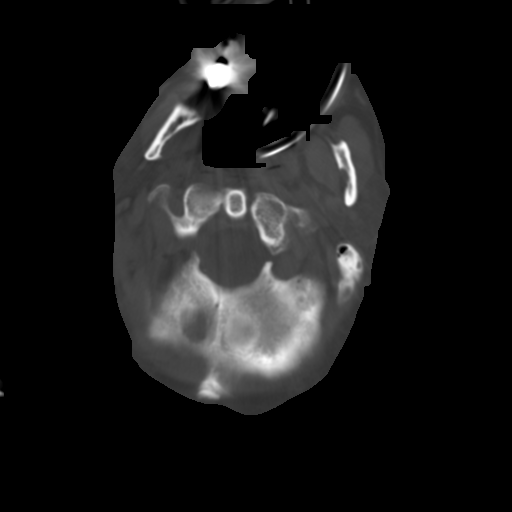
[im 8/36  brain]
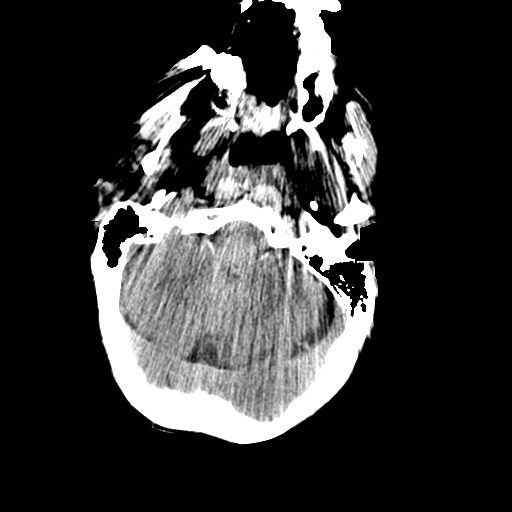
[im 11/36  brain]
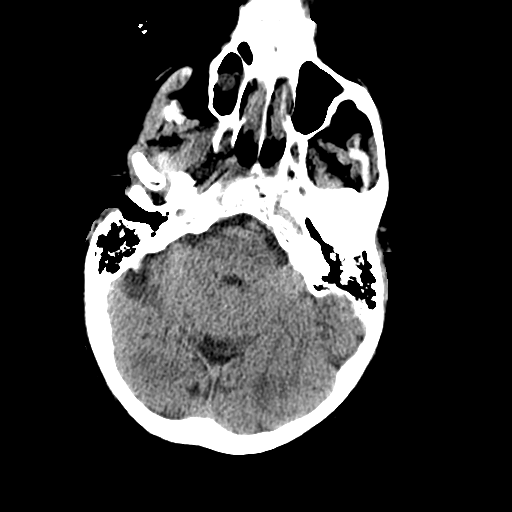
[im 16/36  brain]
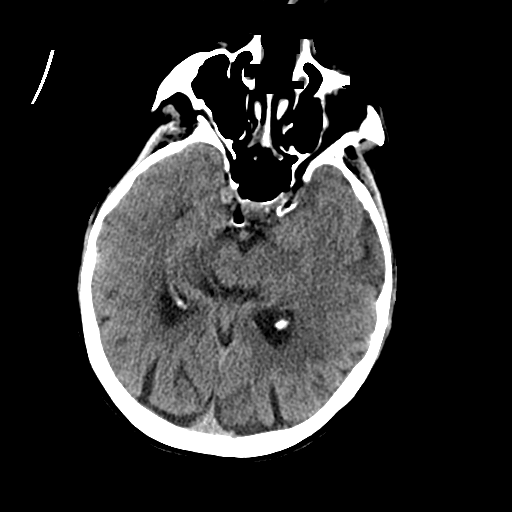
[im 20/36  brain]
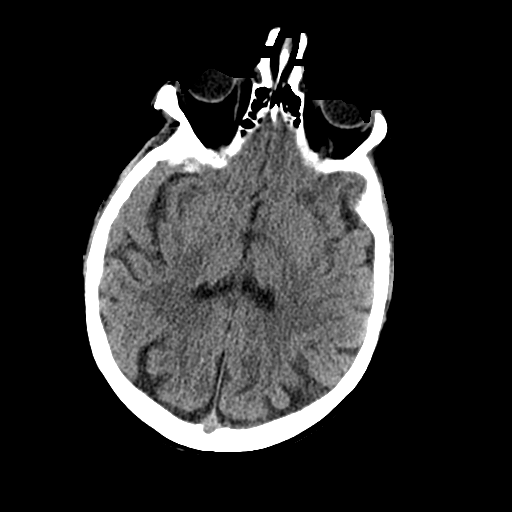
[im 20/36  bone]
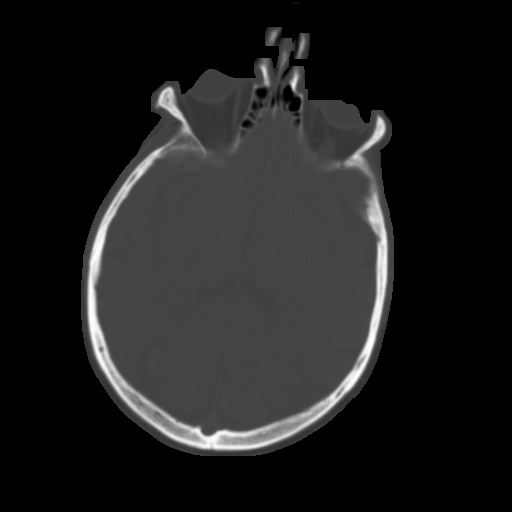
[im 25/36  brain]
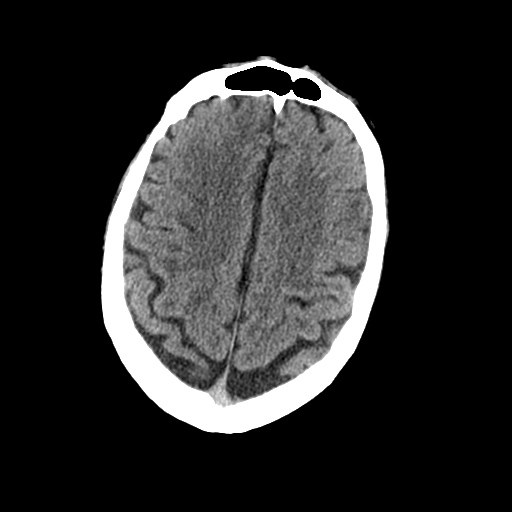
[im 28/36  brain]
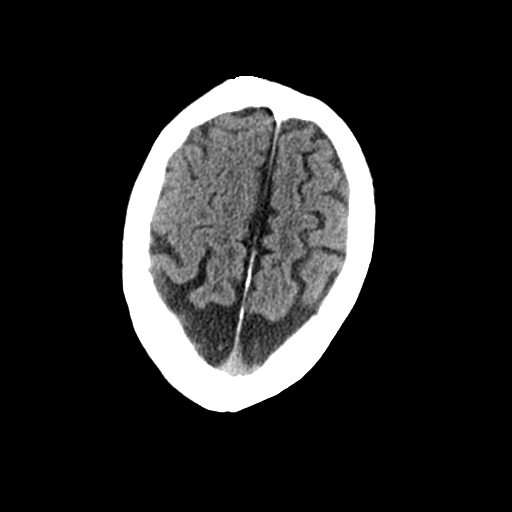
[im 33/36  brain]
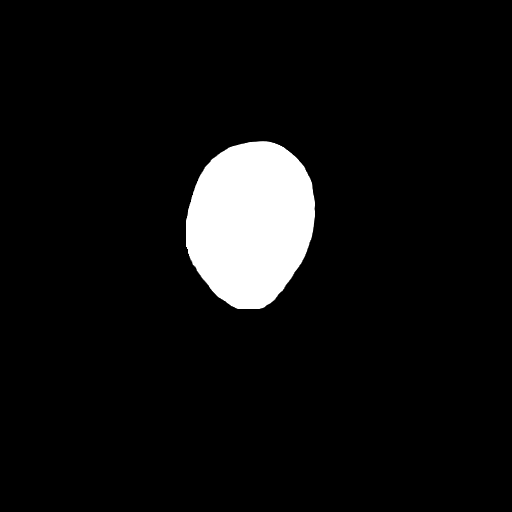

[Series 9: coronal soft tissue · coronal · 0.34mm/px · 3 of 75 slices shown]
[im 25/75  brain]
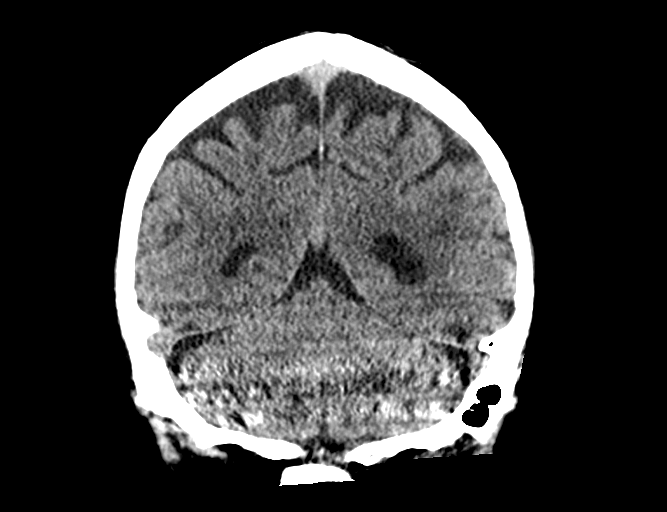
[im 33/75  brain]
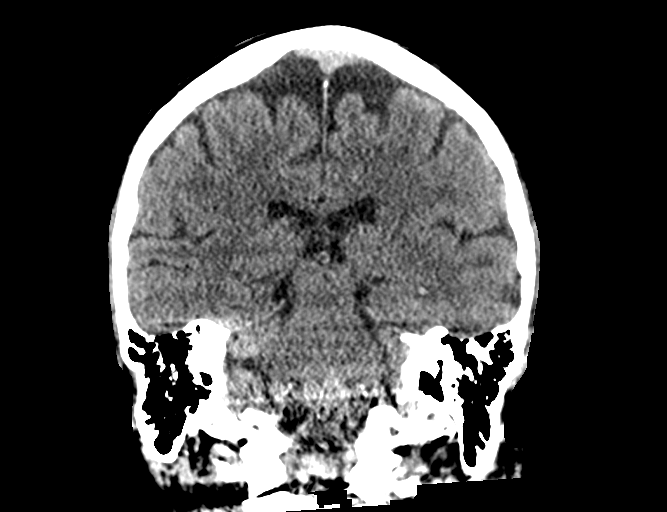
[im 42/75  brain]
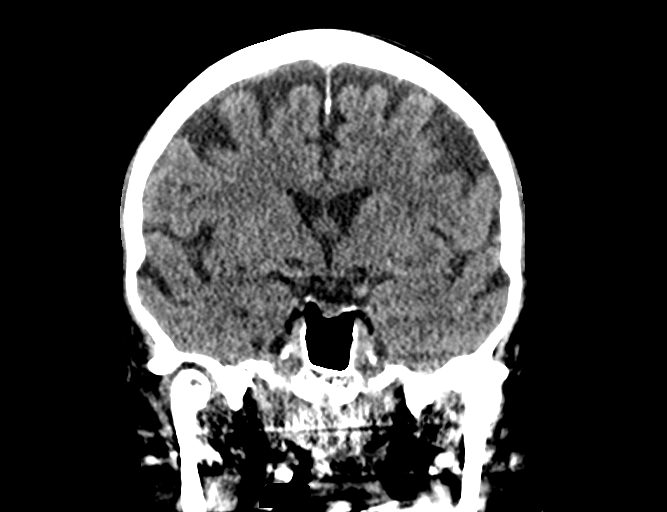

[Series 10: sagittal soft tissue · sagittal · 0.34mm/px · 3 of 54 slices shown]
[im 18/54  brain]
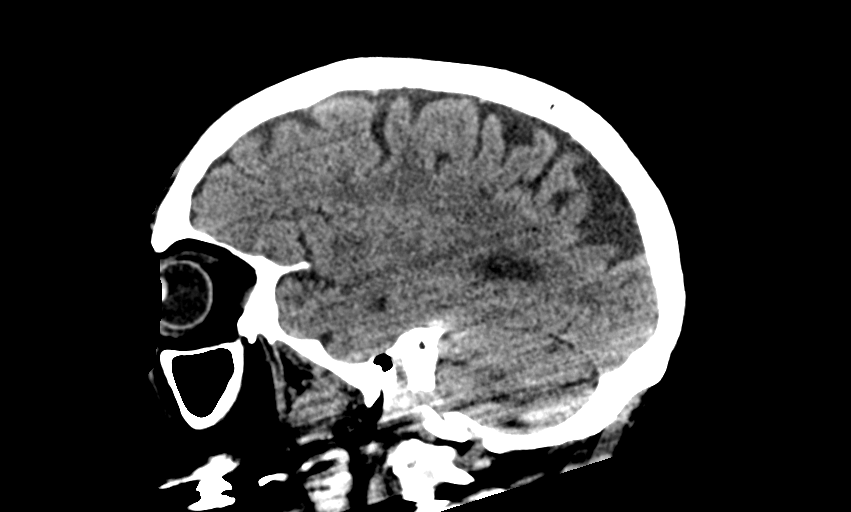
[im 27/54  brain]
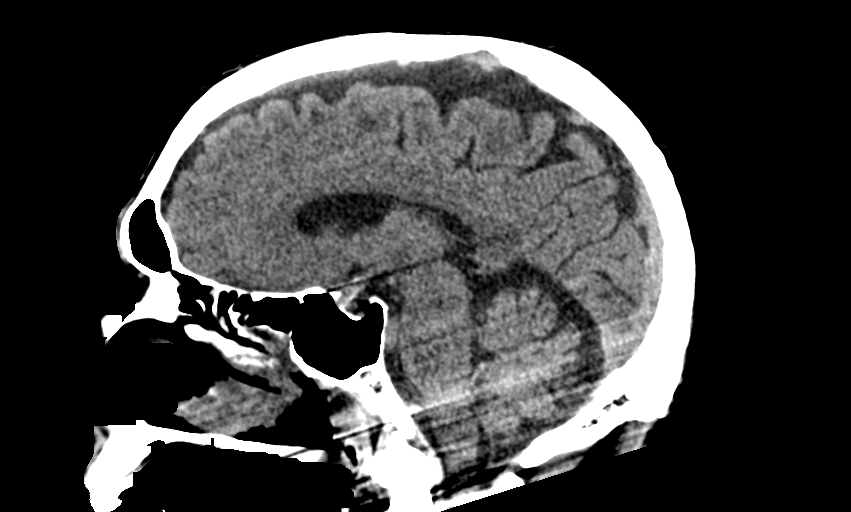
[im 36/54  brain]
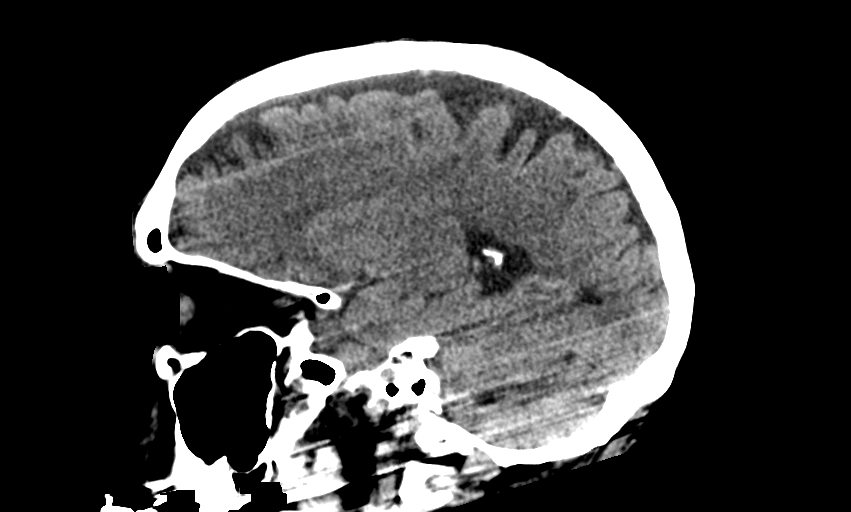

[14 of 47 positions shown; findings below may reference images not displayed]

FINDINGS: Brain: There is mild cerebral atrophy with widening of the
extra-axial spaces and ventricular dilatation.
There are areas of decreased attenuation within the white matter
tracts of the supratentorial brain, consistent with microvascular
disease changes.

Vascular: No hyperdense vessel or unexpected calcification.

Skull: Normal. Negative for fracture or focal lesion.

Sinuses/Orbits: A 1.3 cm x 1.1 cm right maxillary sinus polyp versus
mucous retention cyst is seen.

Other: None.
IMPRESSION: 1. Mild cerebral atrophy and microvascular disease changes of the
supratentorial brain.
2. No acute intracranial abnormality.
3. Right maxillary sinus polyp versus mucous retention cyst.

## 2022-11-13 IMAGING — CT CT ABD-PELV W/ CM
2 of 4 series · 16 of 46 positions shown, 18 images · IV contrast (omnipaque)
Comparison: CT chest 01/04/2020

CLINICAL DATA: Caliber breathing. Fall with compression fracture.
Stage I lung cancer post lobectomy.

EXAM:
CT ABDOMEN AND PELVIS WITH CONTRAST
TECHNIQUE: Multidetector CT imaging of the abdomen and pelvis was performed
using the standard protocol following bolus administration of
intravenous contrast.
Patient was very agitated and required sedation for scan.
CONTRAST:  100mL OMNIPAQUE IOHEXOL 350 MG/ML SOLN

[Series 1: axial st · axial · 0.96mm/px · z∈[-1135,-690]mm · 13 of 103 slices shown, 15 images]
[im 7/103  soft-tissue]
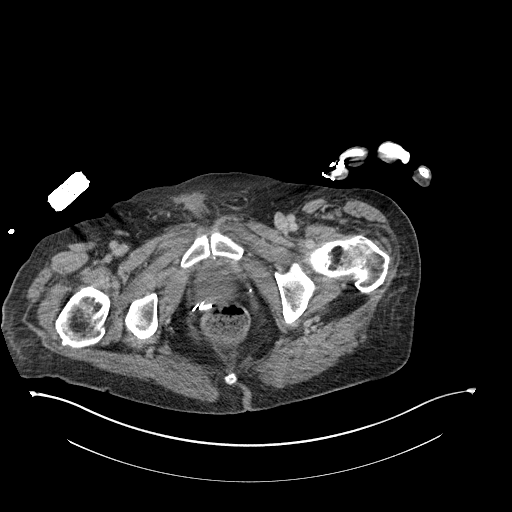
[im 7/103  bone]
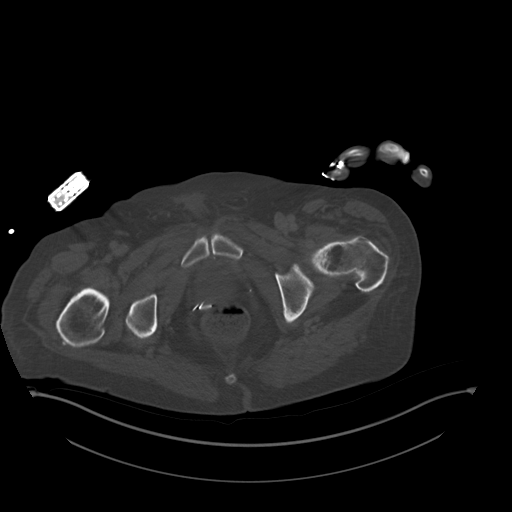
[im 13/103  soft-tissue]
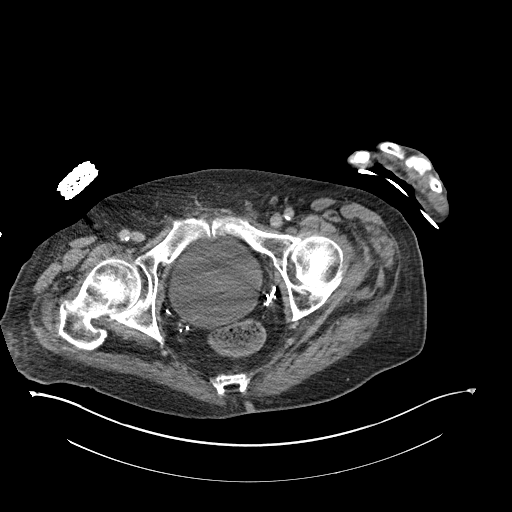
[im 20/103  soft-tissue]
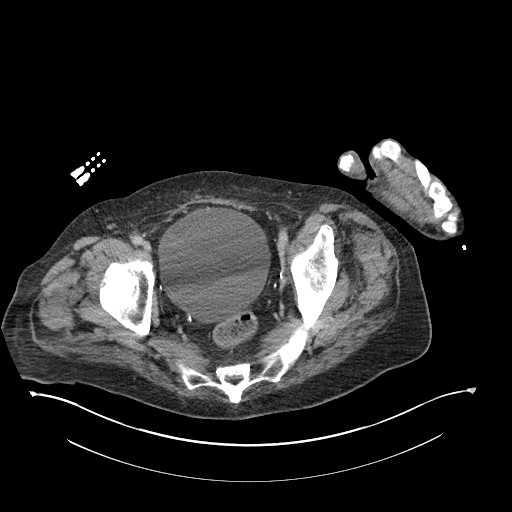
[im 32/103  soft-tissue]
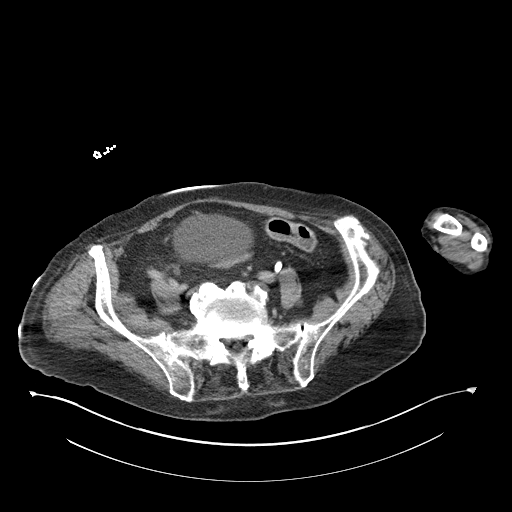
[im 39/103  soft-tissue]
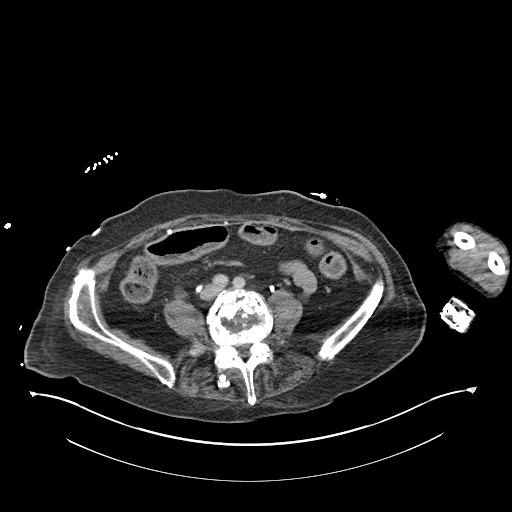
[im 45/103  soft-tissue]
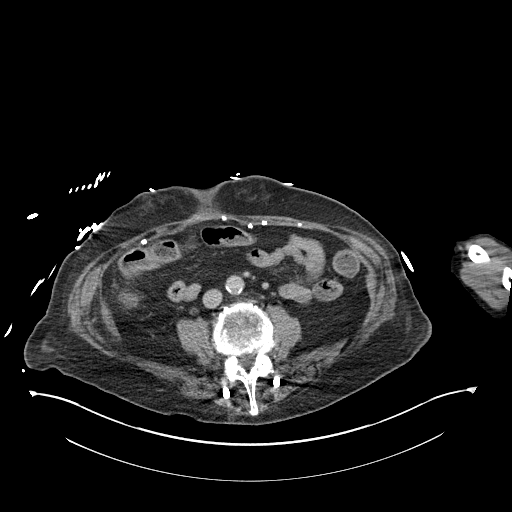
[im 52/103  soft-tissue]
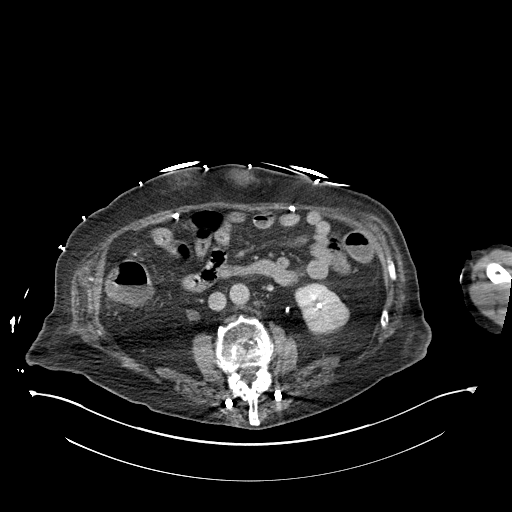
[im 58/103  soft-tissue]
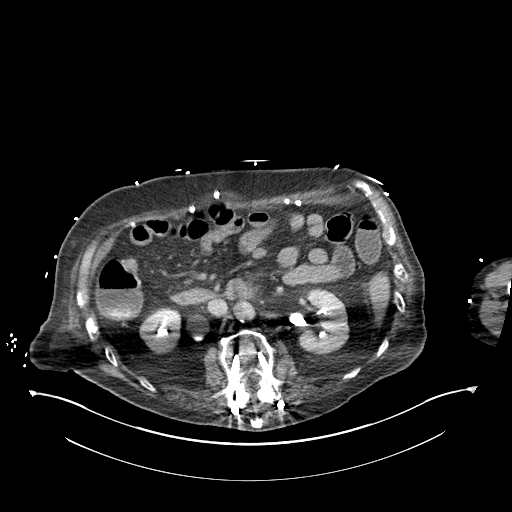
[im 64/103  soft-tissue]
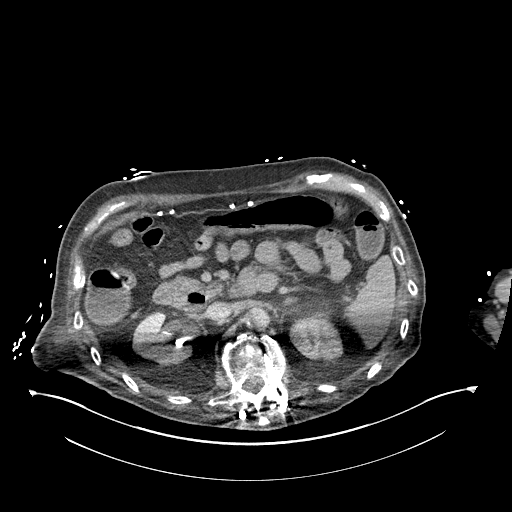
[im 64/103  bone]
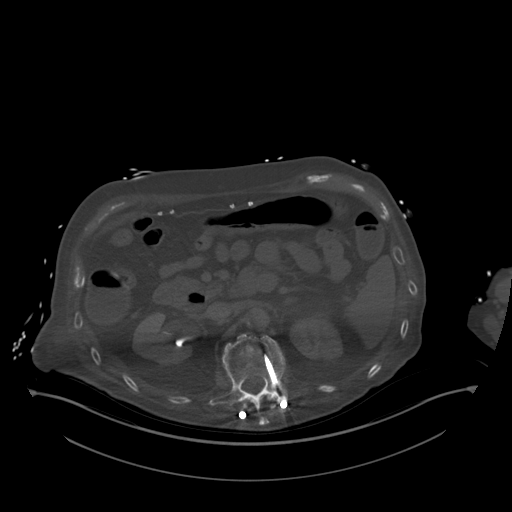
[im 71/103  soft-tissue]
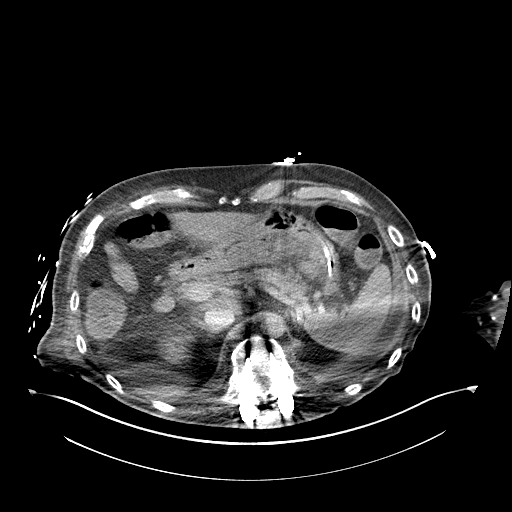
[im 83/103  soft-tissue]
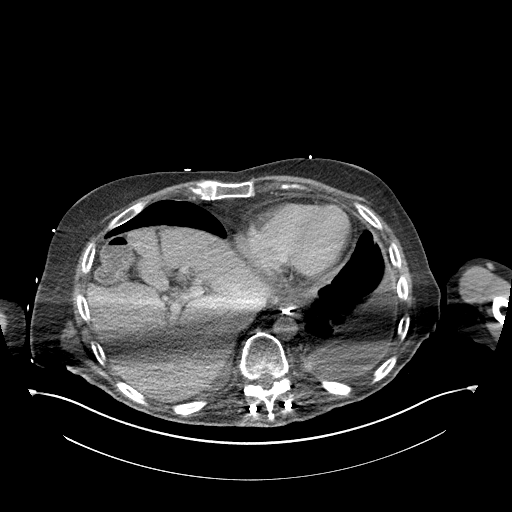
[im 90/103  soft-tissue]
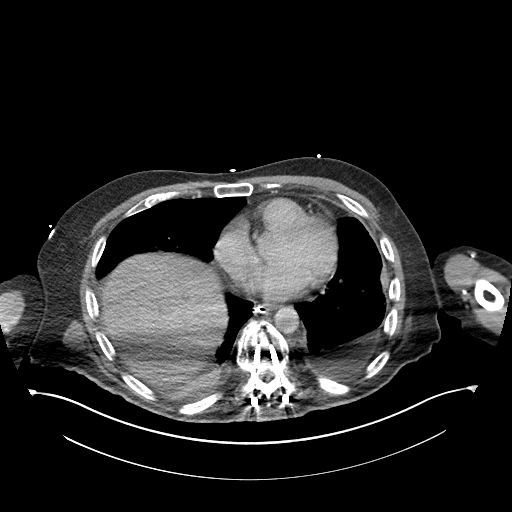
[im 96/103  soft-tissue]
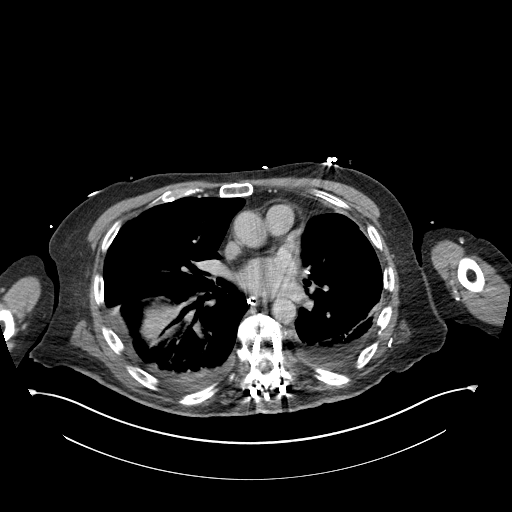

[Series 6: coronal st · coronal · 0.78mm/px · 3 of 135 slices shown]
[im 45/135  soft-tissue]
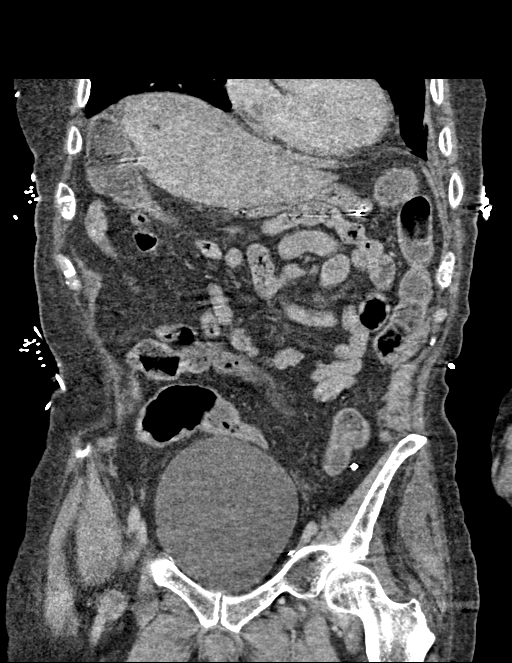
[im 60/135  soft-tissue]
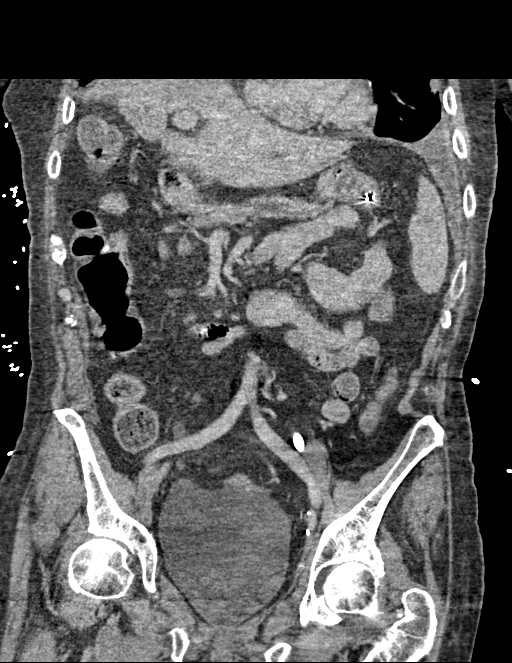
[im 75/135  soft-tissue]
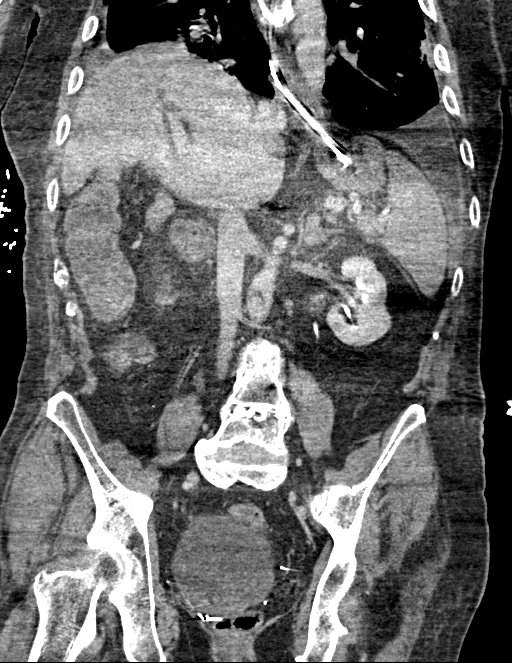

[16 of 46 positions shown; findings below may reference images not displayed]

FINDINGS: Lower chest: Bilateral moderate layering pleural effusions. RIGHT
basilar atelectasis.

Hepatobiliary: No focal hepatic lesion. Postcholecystectomy. No
biliary dilatation.

Pancreas: Pancreas is normal. No ductal dilatation. No pancreatic
inflammation.

Spleen: Normal spleen

Adrenals/urinary tract: Adrenal glands normal. No renal obstruction.
Bladder distended

Stomach/Bowel: NG tube in stomach. Duodenum small-bowel normal. No
colon inflammation or obstruction.

Vascular/Lymphatic: Abdominal aorta is normal caliber. No periportal
or retroperitoneal adenopathy. No pelvic adenopathy.

Reproductive: Prostate unremarkable

Other: No intraperitoneal free air free fluid.

Musculoskeletal: Posterior lumbar fusion
IMPRESSION: 1. No acute findings abdomen pelvis.
2. No renal obstruction.
3. No evidence of bowel inflammation.
4. Bilateral pleural effusions.
5. Posterior thoracolumbar fusion.

## 2022-11-14 IMAGING — DX DG CHEST 1V PORT
1 series · 1 of 1 positions shown · non-contrast
Comparison: Prior chest x-ray yesterday

CLINICAL DATA: Acute respiratory failure

EXAM:
PORTABLE CHEST 1 VIEW

[chest ap]
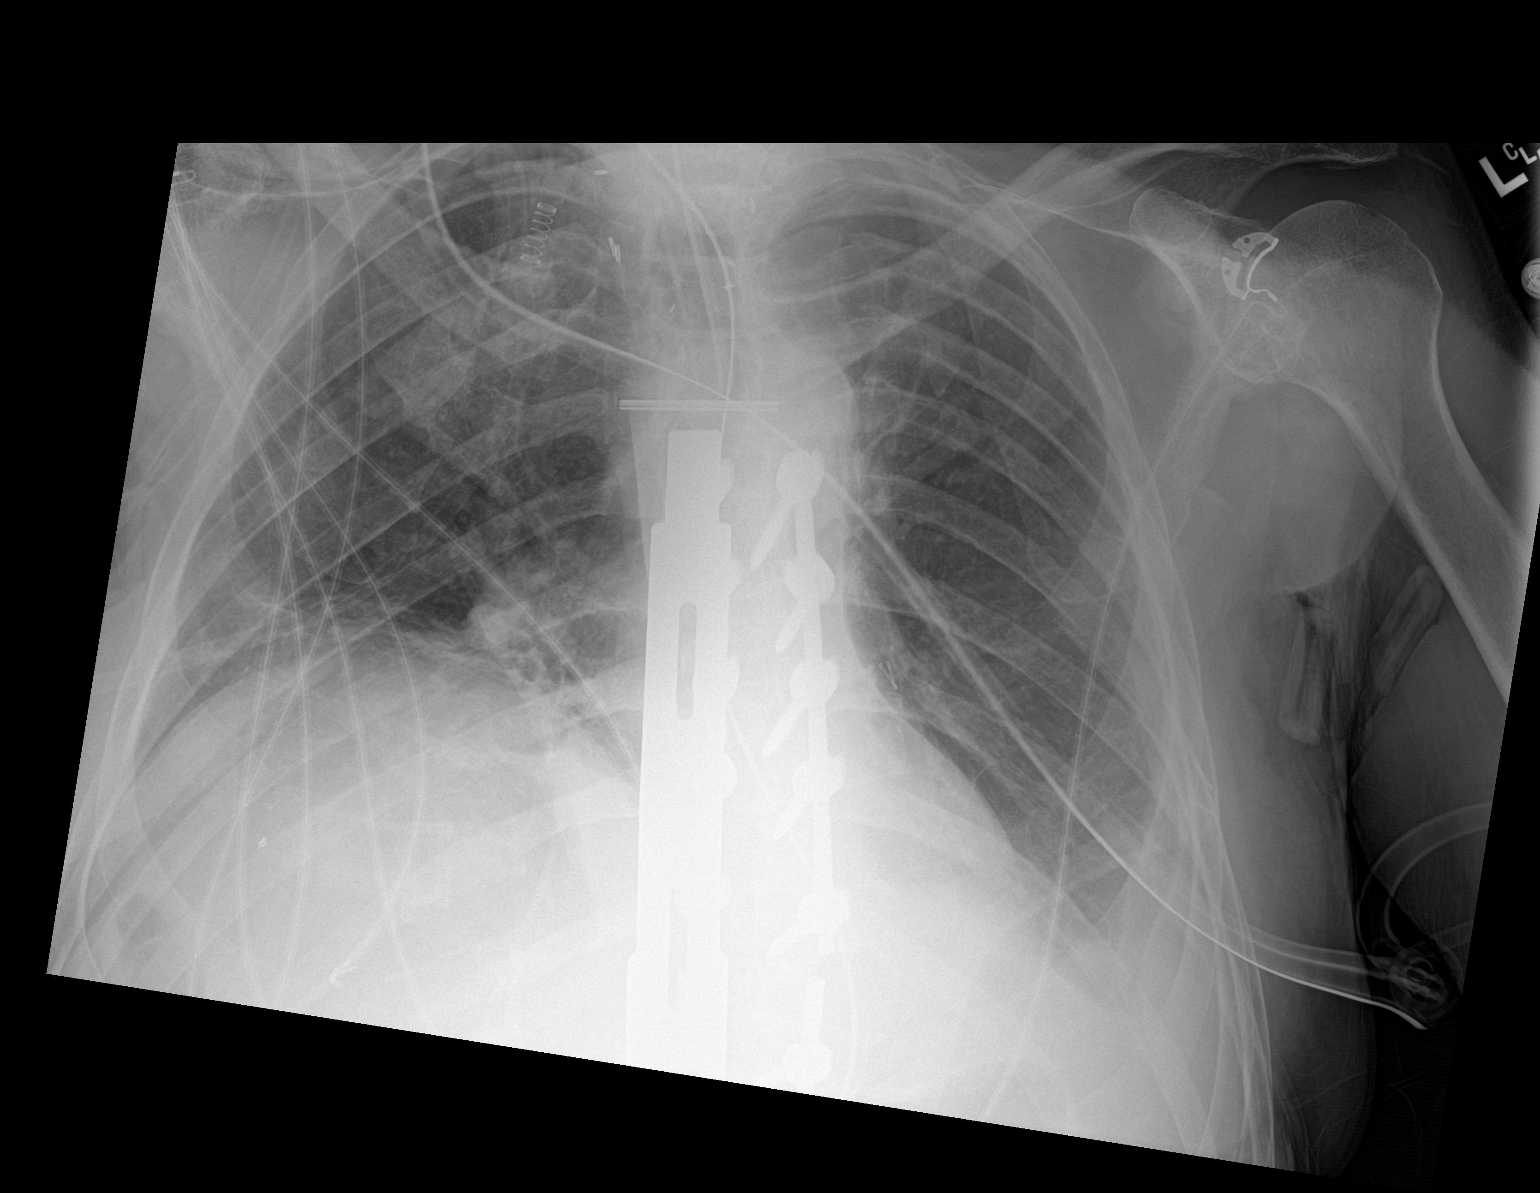

[1 of 1 positions shown; findings below may reference images not displayed]

FINDINGS: The patient is intubated. The tip of the endotracheal tube is 2 cm
above the carina. A gastric tube is present. The tip of the tube
lies off the field of view but below the diaphragm and presumably
within the stomach. Cardiac and mediastinal contours are within
normal limits. Evaluation of the mediastinum is somewhat limited by
extensive thoracic spine hardware. Chronic elevation of the right
hemidiaphragm with associated chronic atelectasis versus scarring in
the right lower lobe. A small layering pleural effusion is present
on the left. The upper lung fields are essentially clear. No
pulmonary edema. No acute osseous abnormality.
IMPRESSION: 1. Overall, inspiratory volumes are slightly lower compared to
yesterday. Otherwise, no significant interval change.
2. The tip of the endotracheal tube is 2 cm above the carina.
3. Chronic elevation of the right hemidiaphragm with associated
right lower lobe atelectasis/scarring.
4. Small layering left pleural effusion with associated atelectasis.

## 2022-11-18 IMAGING — RF DG C-ARM 1-60 MIN
1 series · 2 of 2 positions shown · non-contrast
Comparison: Recent chest radiograph.

CLINICAL DATA: Elective surgery. T9 through L1 posterior
instrumentation.

EXAM:
THORACOLUMBAR SPINE 1V

[Series 1: run · 2 of 2 slices shown]
[im 1/2]
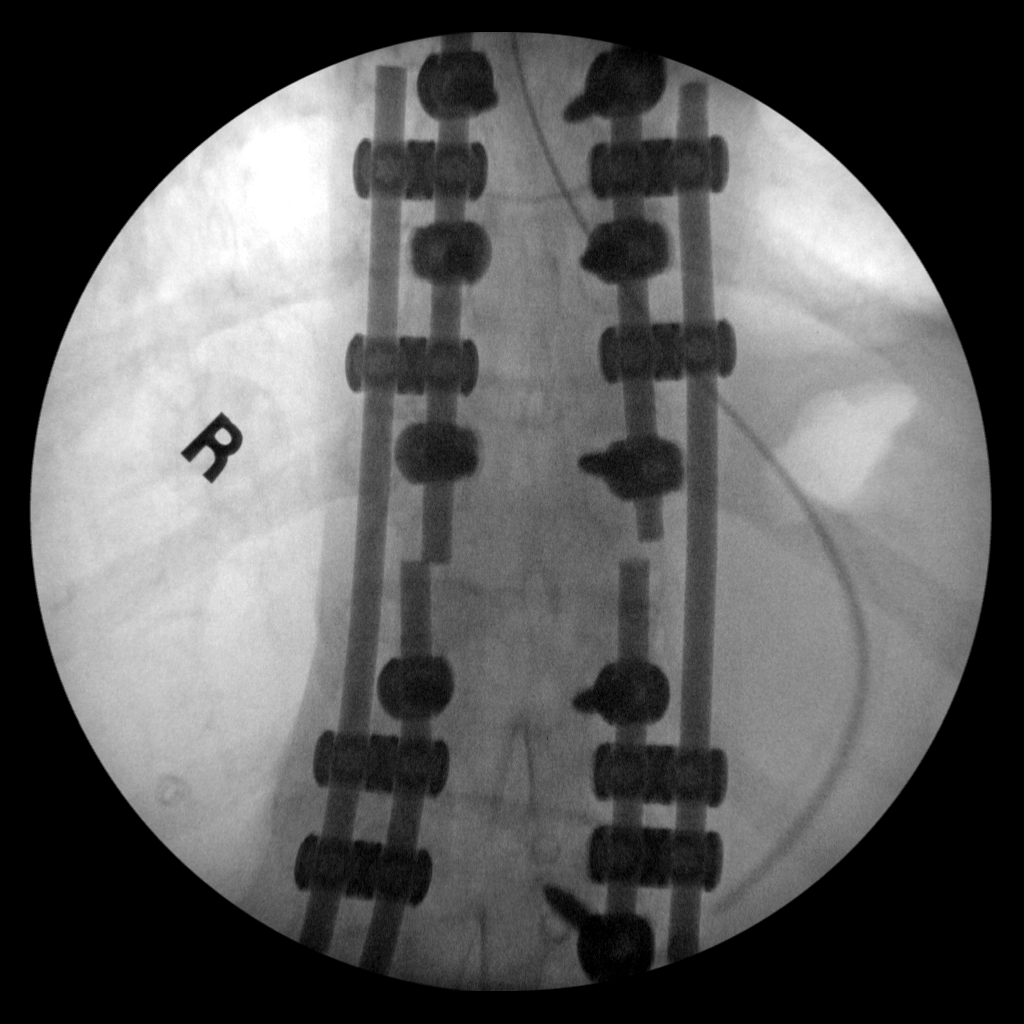
[im 2/2]
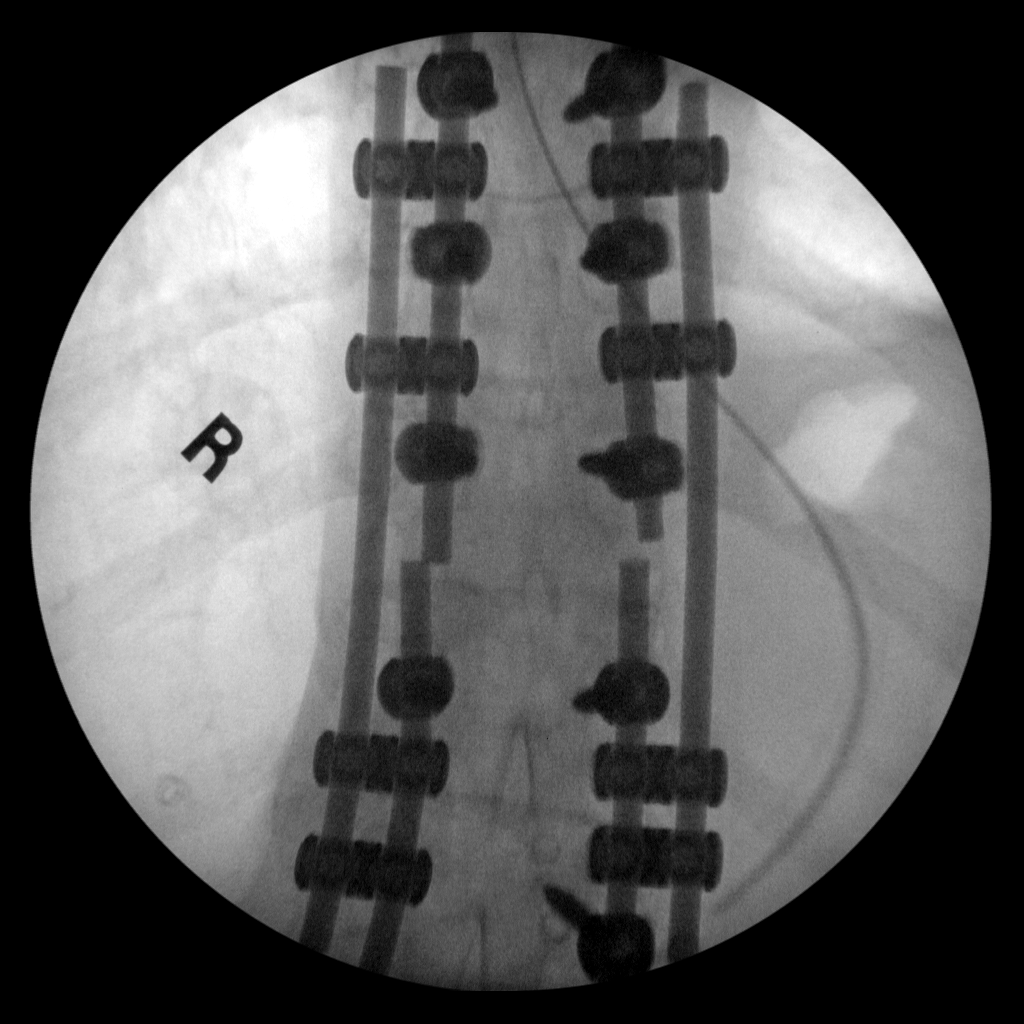

[2 of 2 positions shown; findings below may reference images not displayed]

FINDINGS: Single frontal fluoroscopic spot view of the lower thoracic and
upper lumbar spine demonstrates fusion hardware, not entirely
included in the field of view. Levels are difficult to delineate on
this single view.
IMPRESSION: Single frontal fluoroscopic spot view of the lower thoracic and
upper lumbar spine demonstrates spinal fusion hardware. Please
reference operative report for details.

## 2022-11-18 IMAGING — RF DG THORACOLUMBAR SPINE 2V
1 series · 2 of 2 positions shown · non-contrast
Comparison: Recent chest radiograph.

CLINICAL DATA: Elective surgery. T9 through L1 posterior
instrumentation.

EXAM:
THORACOLUMBAR SPINE 1V

[Series 1: run · 2 of 2 slices shown]
[im 1/2]
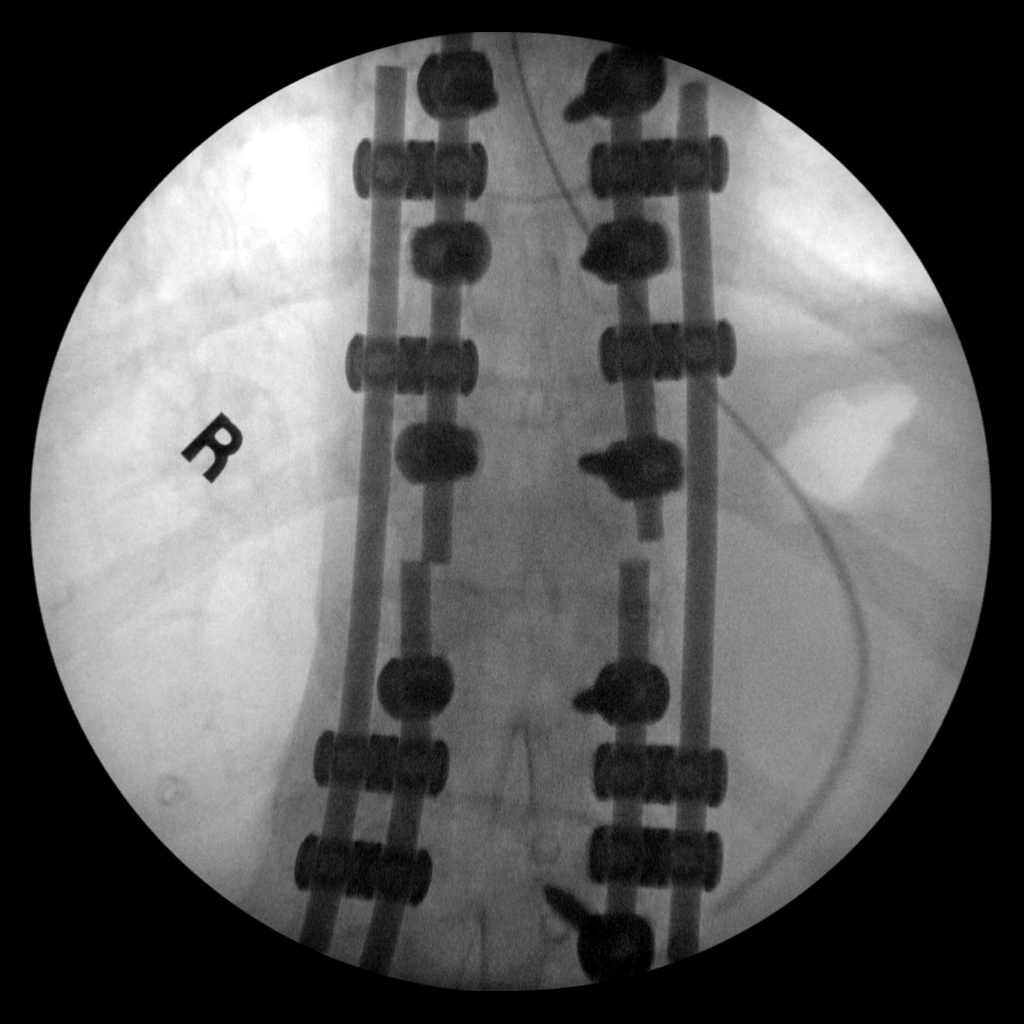
[im 2/2]
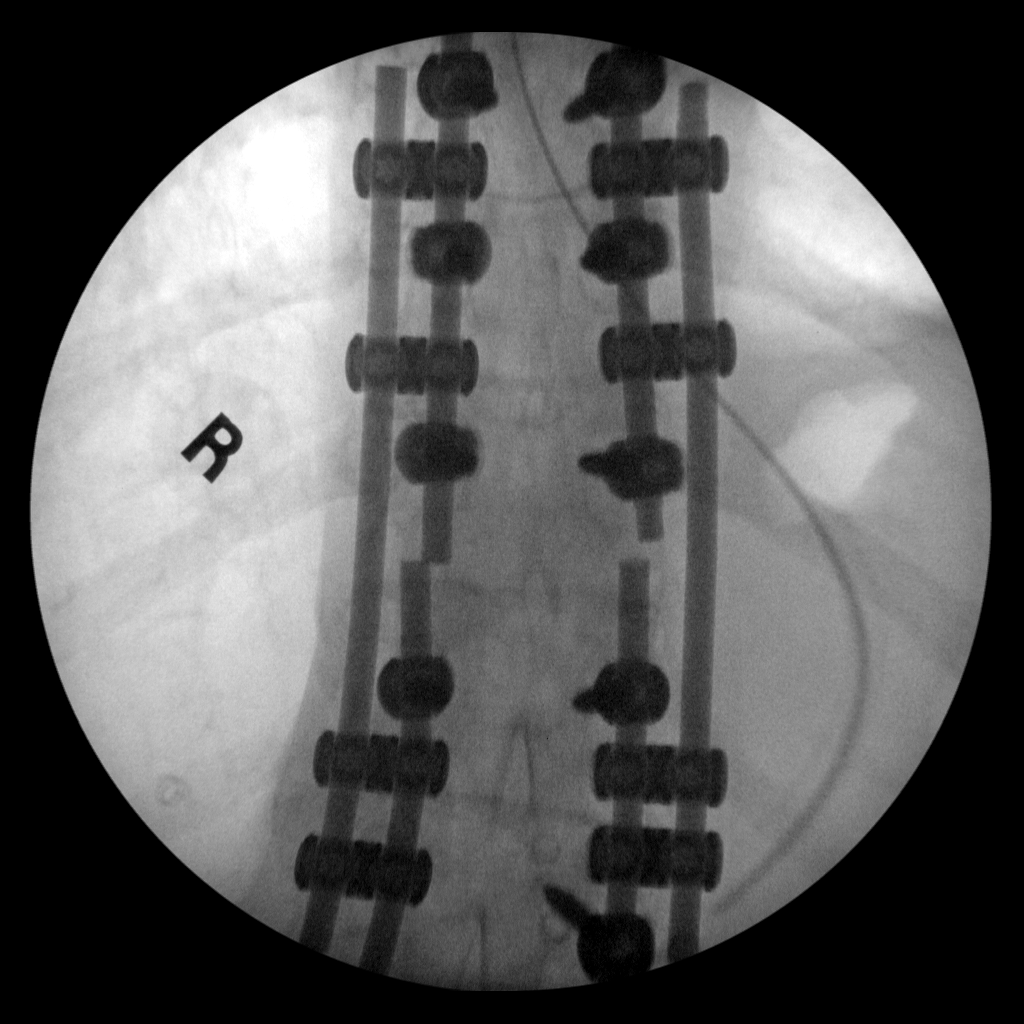

[2 of 2 positions shown; findings below may reference images not displayed]

FINDINGS: Single frontal fluoroscopic spot view of the lower thoracic and
upper lumbar spine demonstrates fusion hardware, not entirely
included in the field of view. Levels are difficult to delineate on
this single view.
IMPRESSION: Single frontal fluoroscopic spot view of the lower thoracic and
upper lumbar spine demonstrates spinal fusion hardware. Please
reference operative report for details.

## 2022-11-18 IMAGING — DX DG CHEST 1V PORT
1 series · 1 of 1 positions shown · non-contrast
Comparison: September 11, 2020.

CLINICAL DATA: Endotracheal tube.

EXAM:
PORTABLE CHEST 1 VIEW

[chest ap]
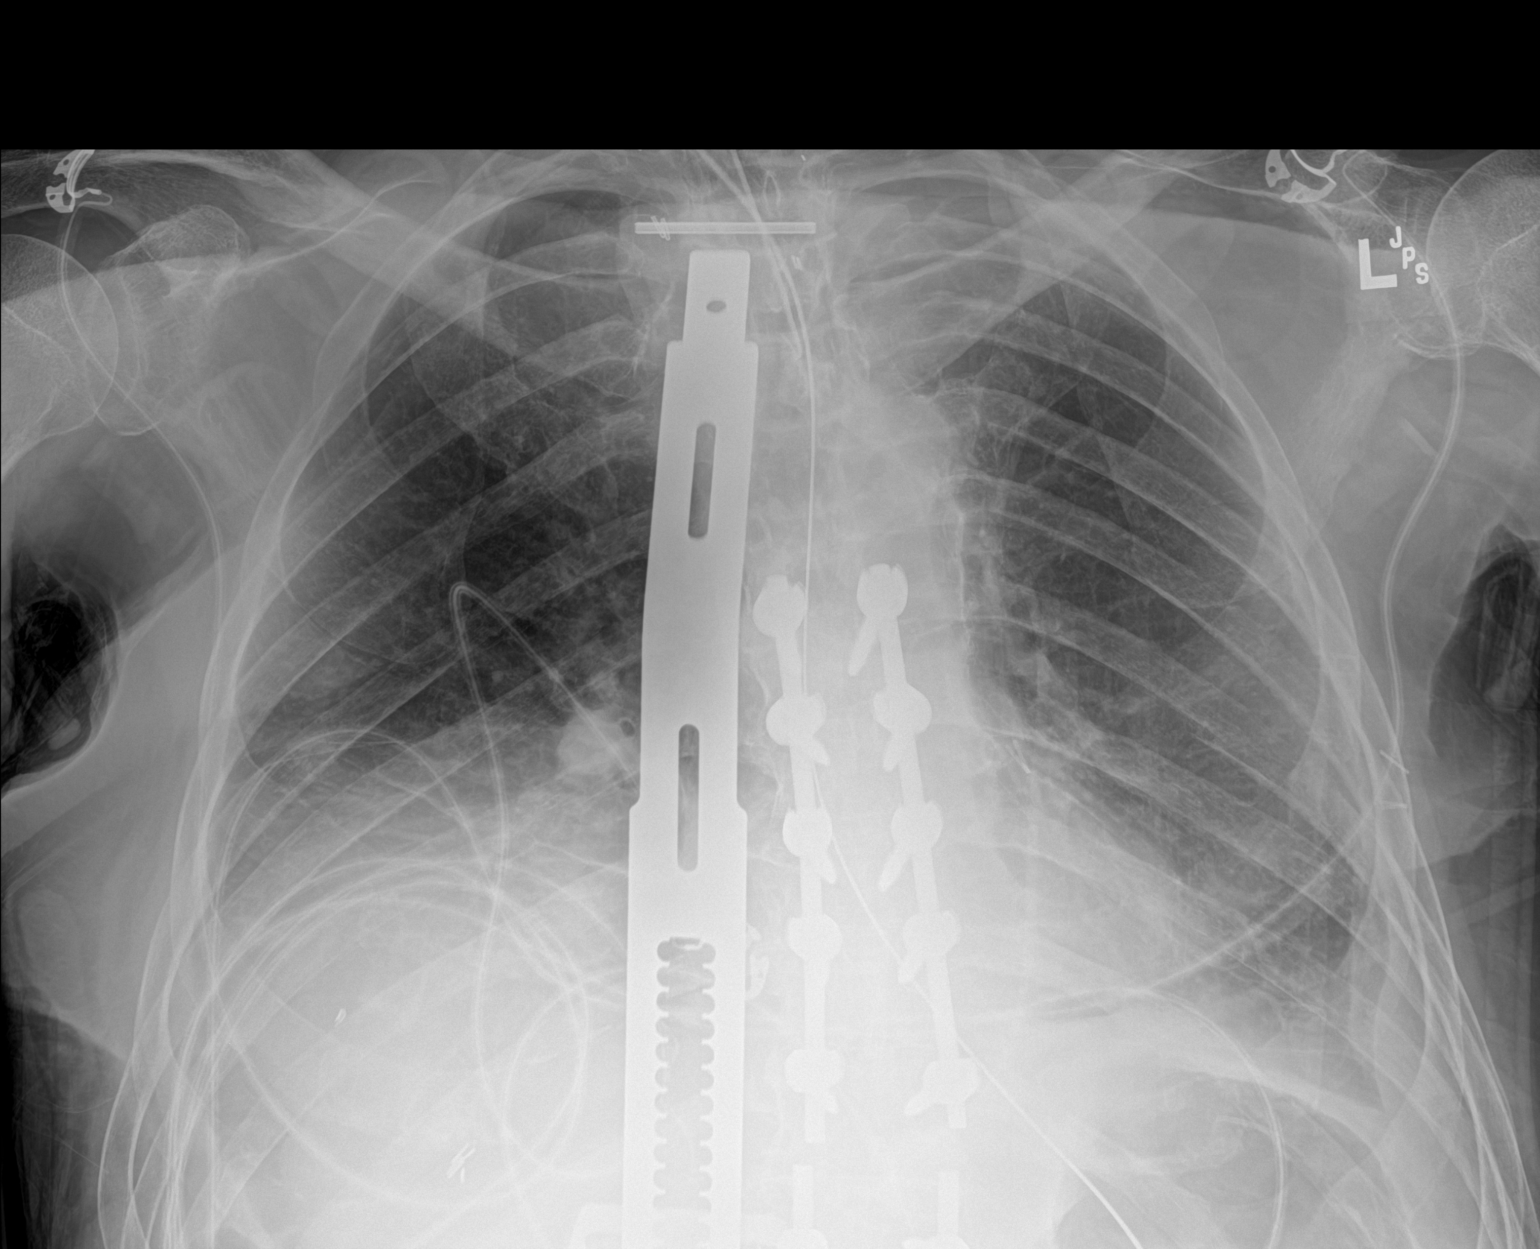

[1 of 1 positions shown; findings below may reference images not displayed]

FINDINGS: The heart size and mediastinal contours are within normal limits. No
pneumothorax is noted. Endotracheal and nasogastric tubes are in
good position. Bibasilar atelectasis is noted with probable small
pleural effusions. Status post surgical posterior fusion of lower
thoracic spine.
IMPRESSION: Endotracheal and nasogastric tubes are in good position. Bibasilar
subsegmental atelectasis with probable small pleural effusions.

## 2022-11-19 IMAGING — DX DG CHEST 1V PORT
1 series · 1 of 1 positions shown · non-contrast
Comparison: Chest x-ray from yesterday.

CLINICAL DATA: Intubated.  Thoracic spine surgery yesterday.

EXAM:
PORTABLE CHEST 1 VIEW

[chest ap]
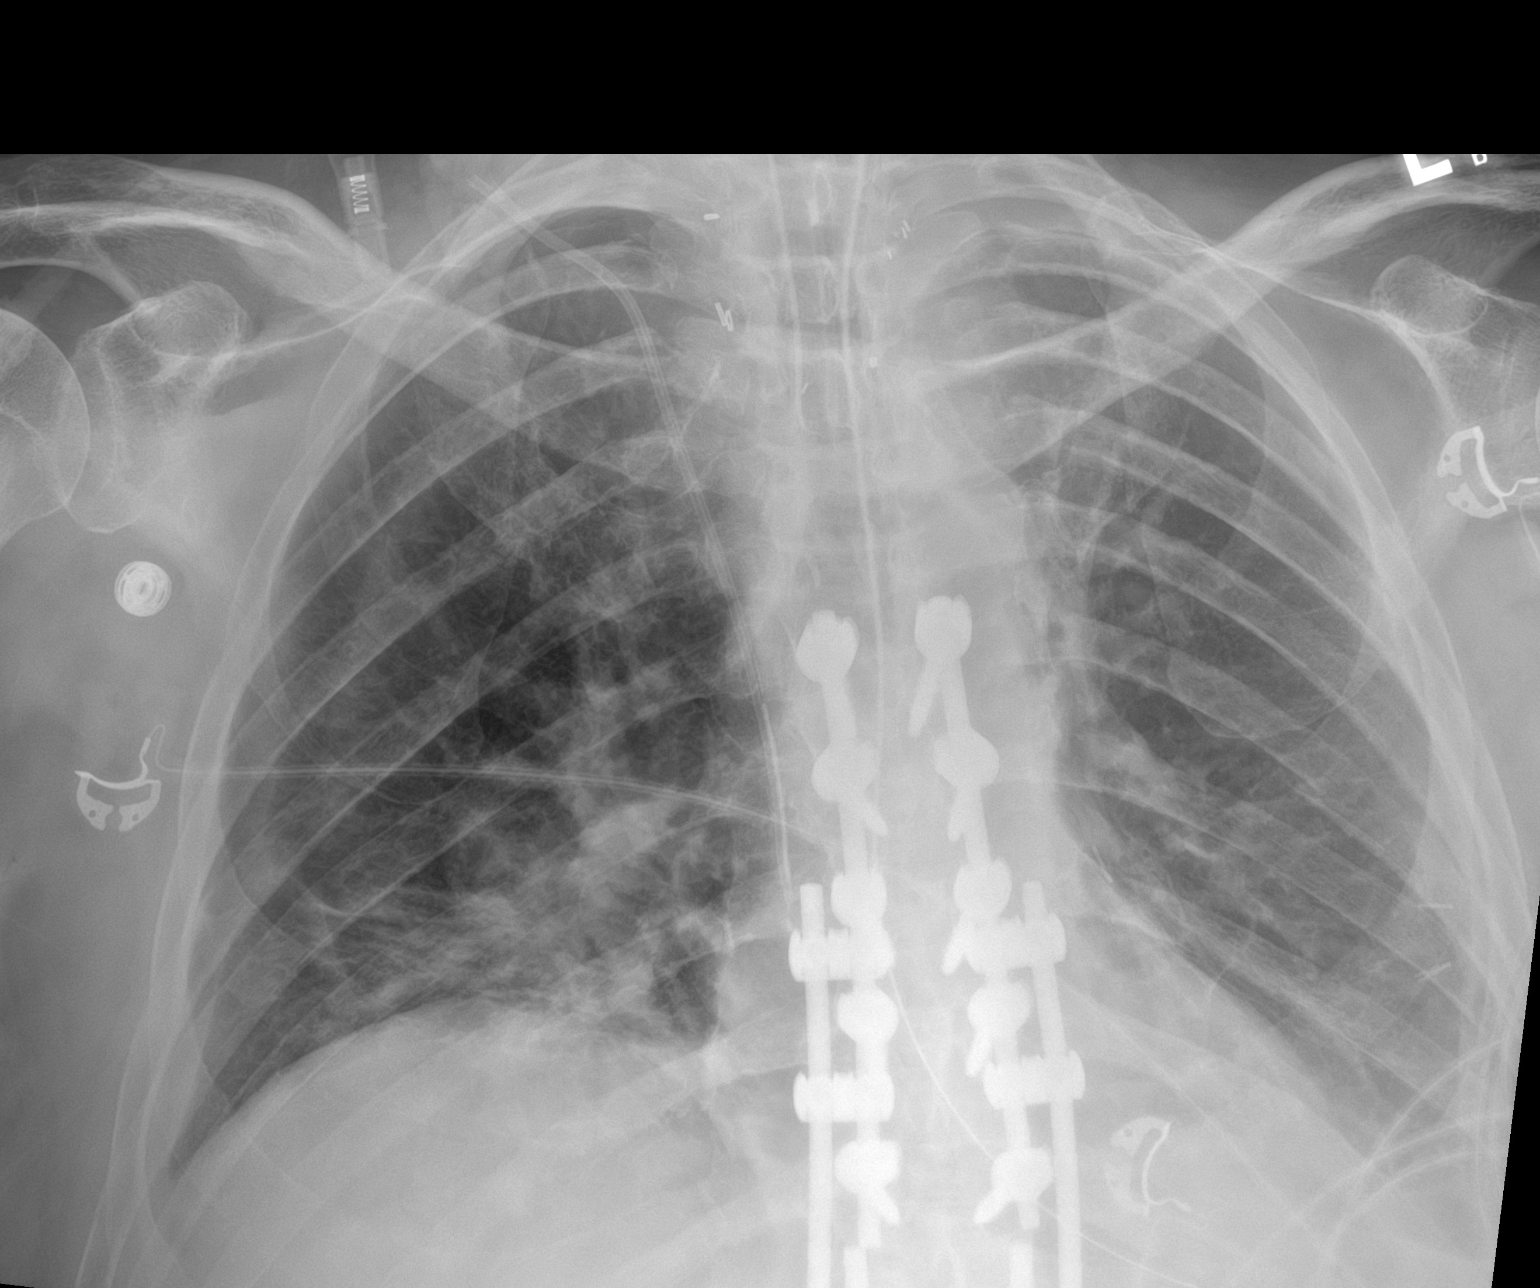

[1 of 1 positions shown; findings below may reference images not displayed]

FINDINGS: Unchanged endotracheal and enteric tubes. New right internal jugular
central venous catheter with tip near the cavoatrial junction. The
heart size and mediastinal contours are within normal limits.
Improving atelectasis at the lung bases. Unchanged small bilateral
pleural effusions. No pneumothorax. Prior thoracolumbar fusion.
IMPRESSION: 1. New right internal jugular central venous catheter. No
pneumothorax.
2. Improving bibasilar atelectasis. Unchanged small bilateral
pleural effusions.

## 2022-11-19 IMAGING — DX DG ABDOMEN 1V
1 series · 1 of 1 positions shown · non-contrast
Comparison: 09/10/2020

CLINICAL DATA: OG tube placement

EXAM:
ABDOMEN - 1 VIEW

[abdomen kub]
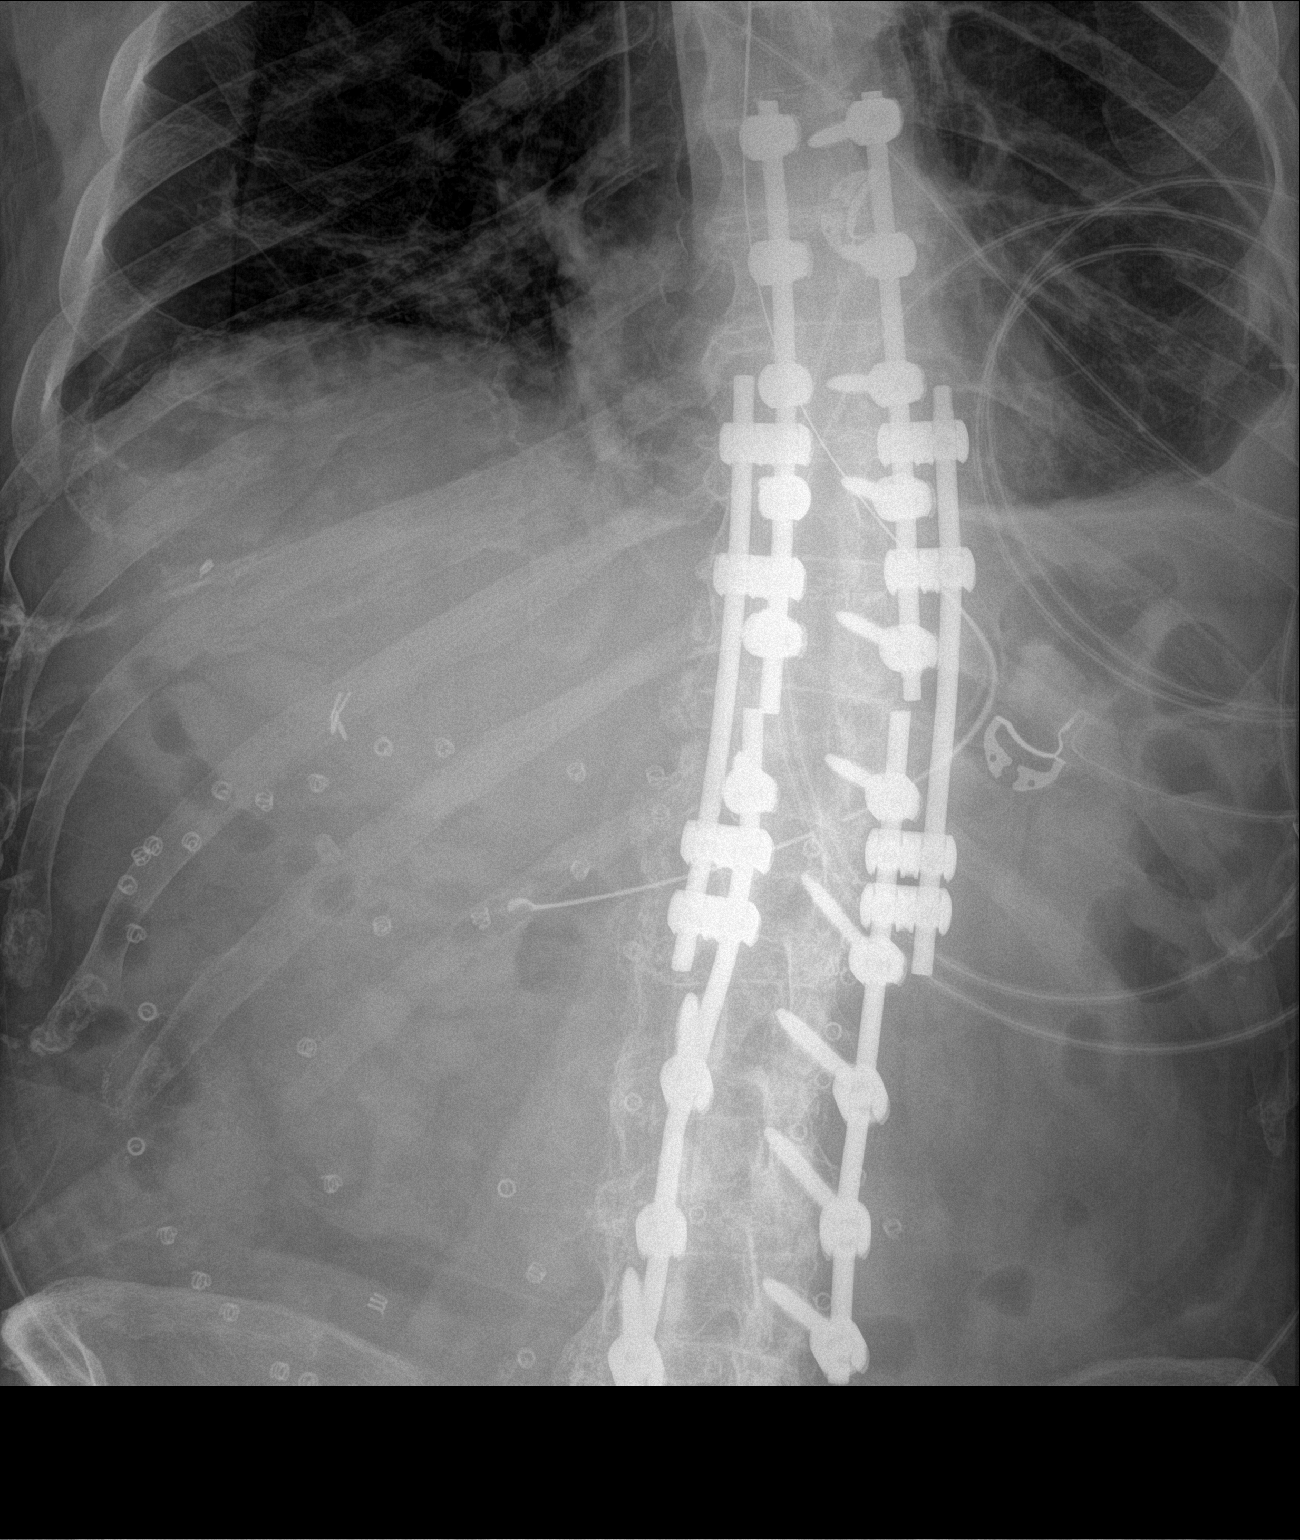

[1 of 1 positions shown; findings below may reference images not displayed]

FINDINGS: 1944 hours. OG tube tip is in the distal stomach near the pylorus.
Extensive thoracolumbar fusion hardware evident. Visualized abdomen
demonstrates nonspecific bowel gas pattern.
IMPRESSION: OG tube tip is in the distal stomach.

## 2022-11-21 IMAGING — DX DG CHEST 1V PORT
1 series · 2 of 2 positions shown · non-contrast
Comparison: September 16, 2020

CLINICAL DATA: Hypoxia

EXAM:
PORTABLE CHEST 1 VIEW

[Series 1: chest · 0.14mm/px · 2 of 2 slices shown]
[im 1/2]
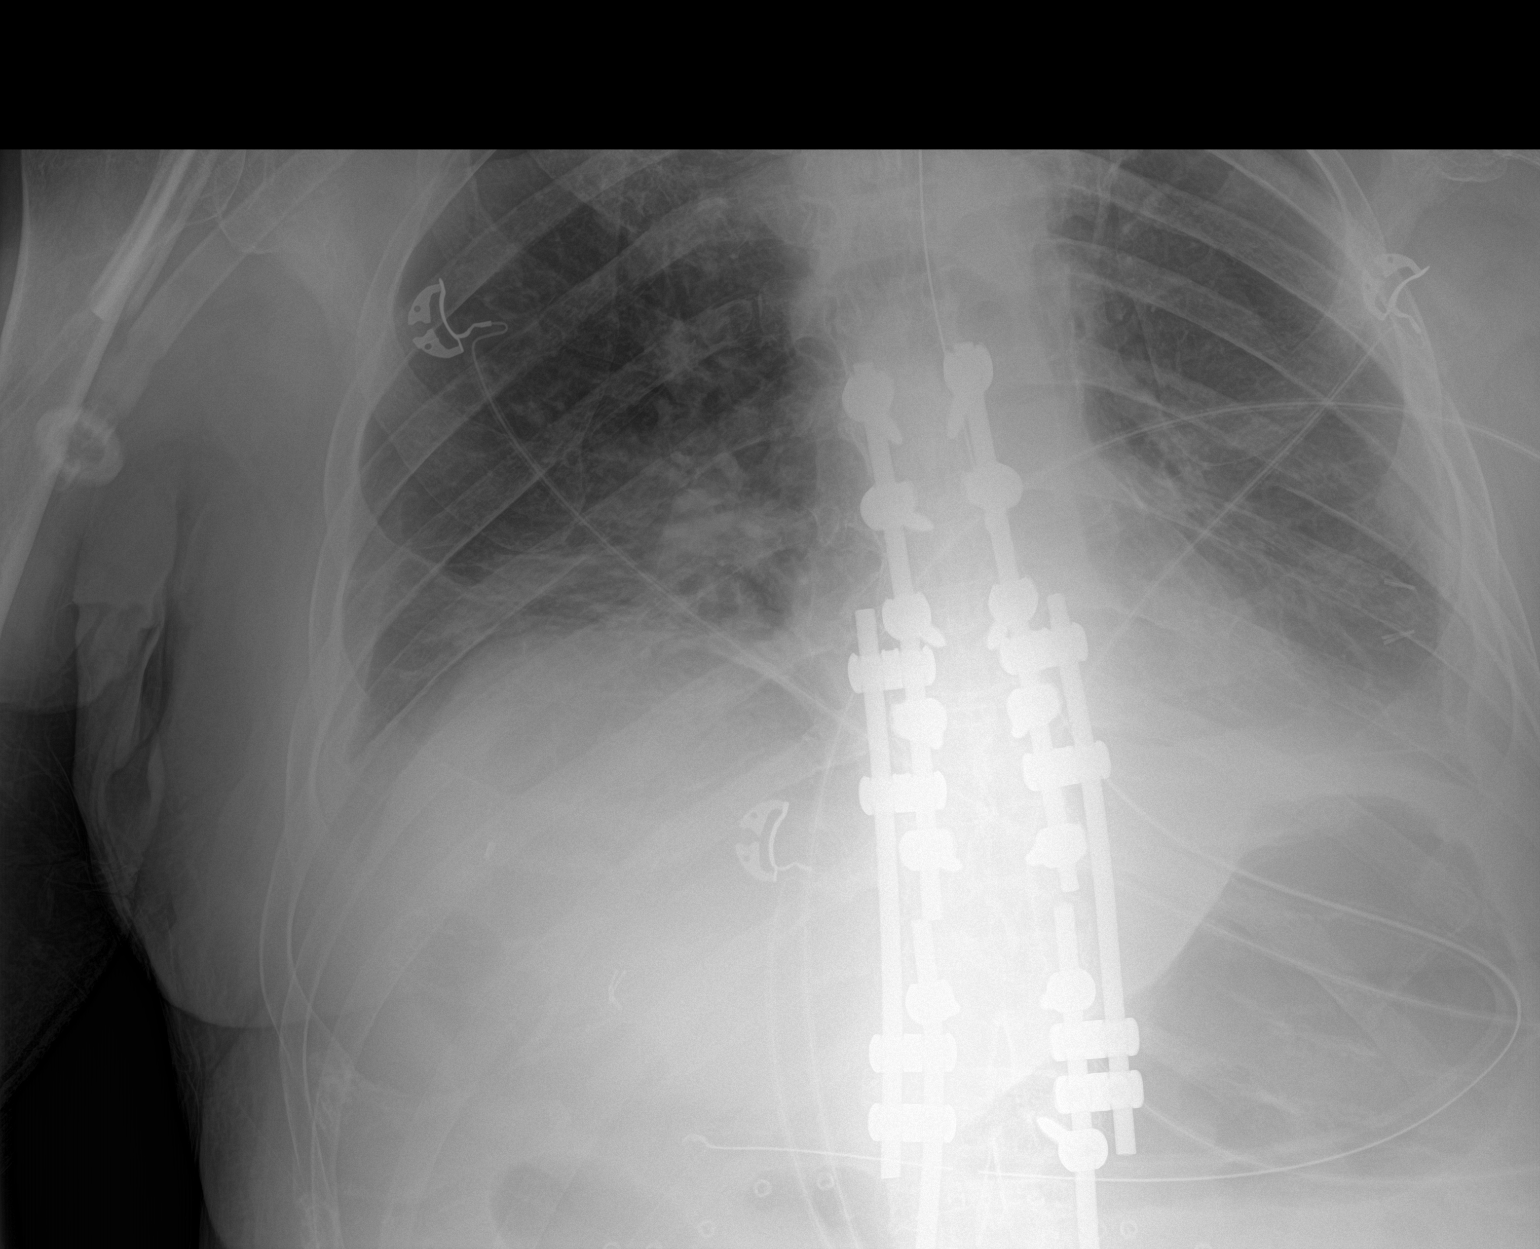
[im 2/2]
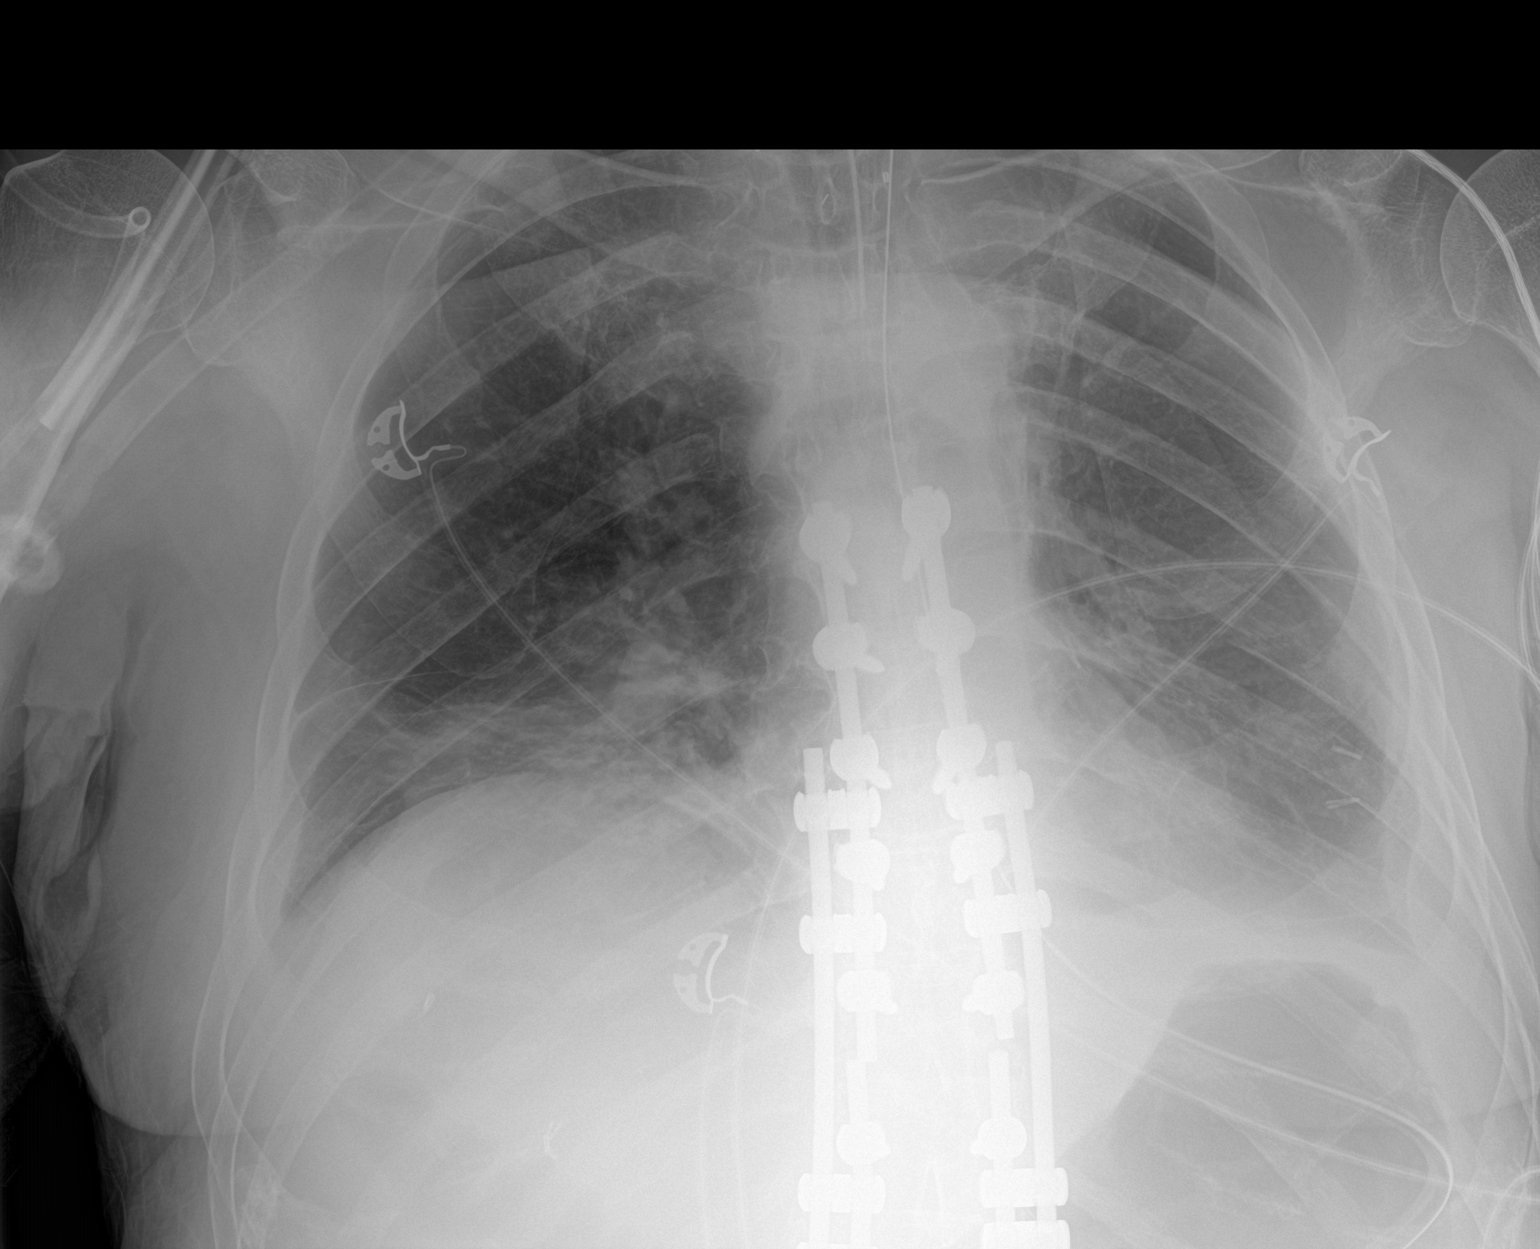

[2 of 2 positions shown; findings below may reference images not displayed]

FINDINGS: Endotracheal tube tip is 3.9 cm above the carina. Nasogastric tube
tip and side port are below the diaphragm. Right jugular catheter no
longer appreciable. No pneumothorax. There is a stable small left
pleural effusion with equivocal small right pleural effusion. There
is atelectatic change in the lung bases. There may be mild patchy
infiltrate associated with the atelectasis in the right base. Lungs
elsewhere clear.

Heart size normal. Pulmonary vascularity normal. No adenopathy.
Postoperative changes noted in the mid to lower thoracic spine and
visualized upper lumbar spine.
IMPRESSION: Tube positions as described without pneumothorax. Small pleural
effusions with bibasilar atelectasis. Question small focus of
pneumonia associated with atelectasis right base. Stable cardiac
silhouette.

## 2022-11-22 IMAGING — DX DG CHEST 1V PORT
1 series · 1 of 1 positions shown · non-contrast
Comparison: Film from earlier in the same day

CLINICAL DATA: Post tracheostomy

EXAM:
PORTABLE CHEST 1 VIEW

[chest ap]
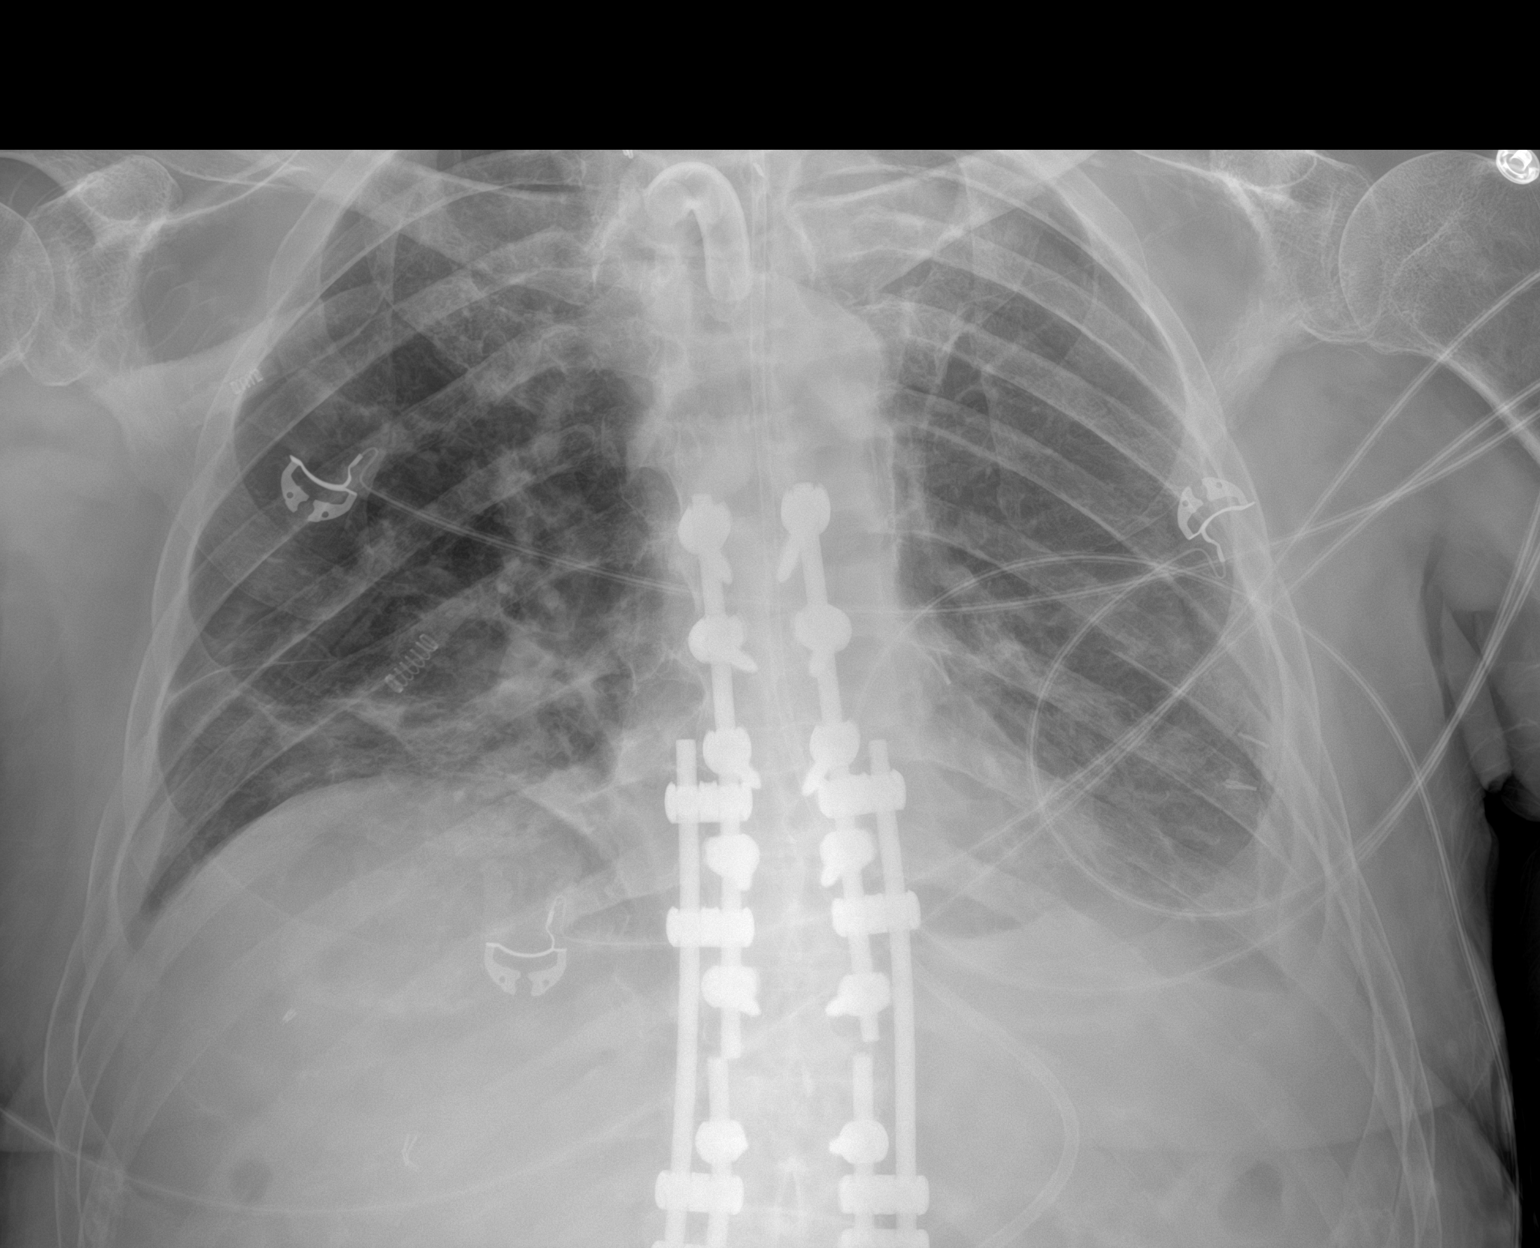

[1 of 1 positions shown; findings below may reference images not displayed]

FINDINGS: Feeding catheter is again identified extending into the stomach.
Endotracheal tube is been removed and new tracheostomy tube is noted
in satisfactory position. Bibasilar atelectatic changes are again
seen and stable. No pneumothorax is noted. Postsurgical changes the
thoracolumbar spine are noted.
IMPRESSION: Status post tracheostomy placement. The remainder of the exam is
stable from the prior study.

## 2022-11-22 IMAGING — DX DG CHEST 1V PORT
1 series · 1 of 1 positions shown · non-contrast
Comparison: 09/18/2020

CLINICAL DATA: Acute respiratory failure.

EXAM:
PORTABLE CHEST 1 VIEW

[chest ap]
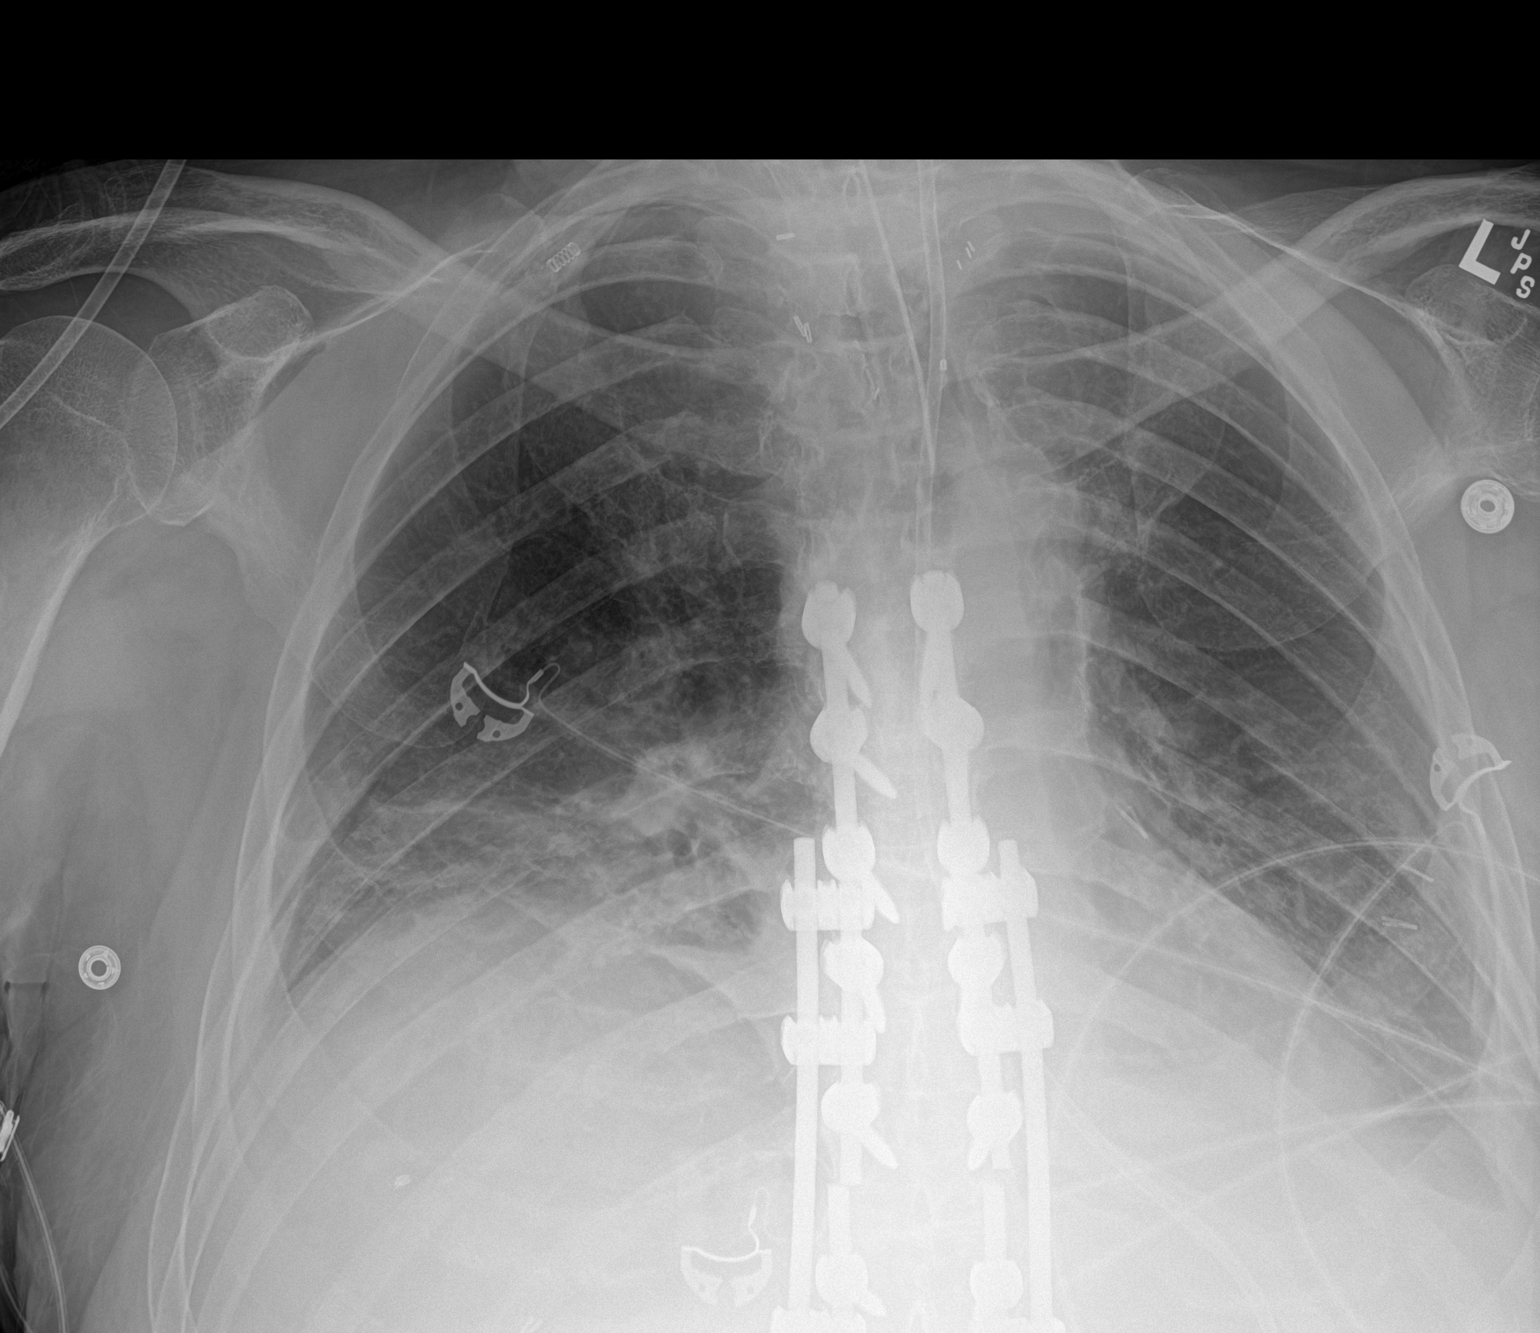

[1 of 1 positions shown; findings below may reference images not displayed]

FINDINGS: Endotracheal tube is 3.2 cm above the carina. Feeding tube extends
into the abdomen but the tip is beyond the image. Persistent patchy
densities in the lower lungs. Heart size is grossly stable but
obscured by the lung densities. Negative for pneumothorax. Scattered
surgical clips in the chest. Extensive spinal hardware involving the
thoracic and lumbar spine.
IMPRESSION: 1. No significant change in the bibasilar lung densities. Findings
could represent atelectasis and/or infection with pleural effusions.
2. Endotracheal tube is appropriately positioned.

## 2022-11-24 IMAGING — DX DG CHEST 1V PORT
1 series · 2 of 2 positions shown · non-contrast
Comparison: 09/19/2020

CLINICAL DATA: Pneumonia

EXAM:
PORTABLE CHEST 1 VIEW

[Series 1: chest · 0.14mm/px · 2 of 2 slices shown]
[im 1/2]
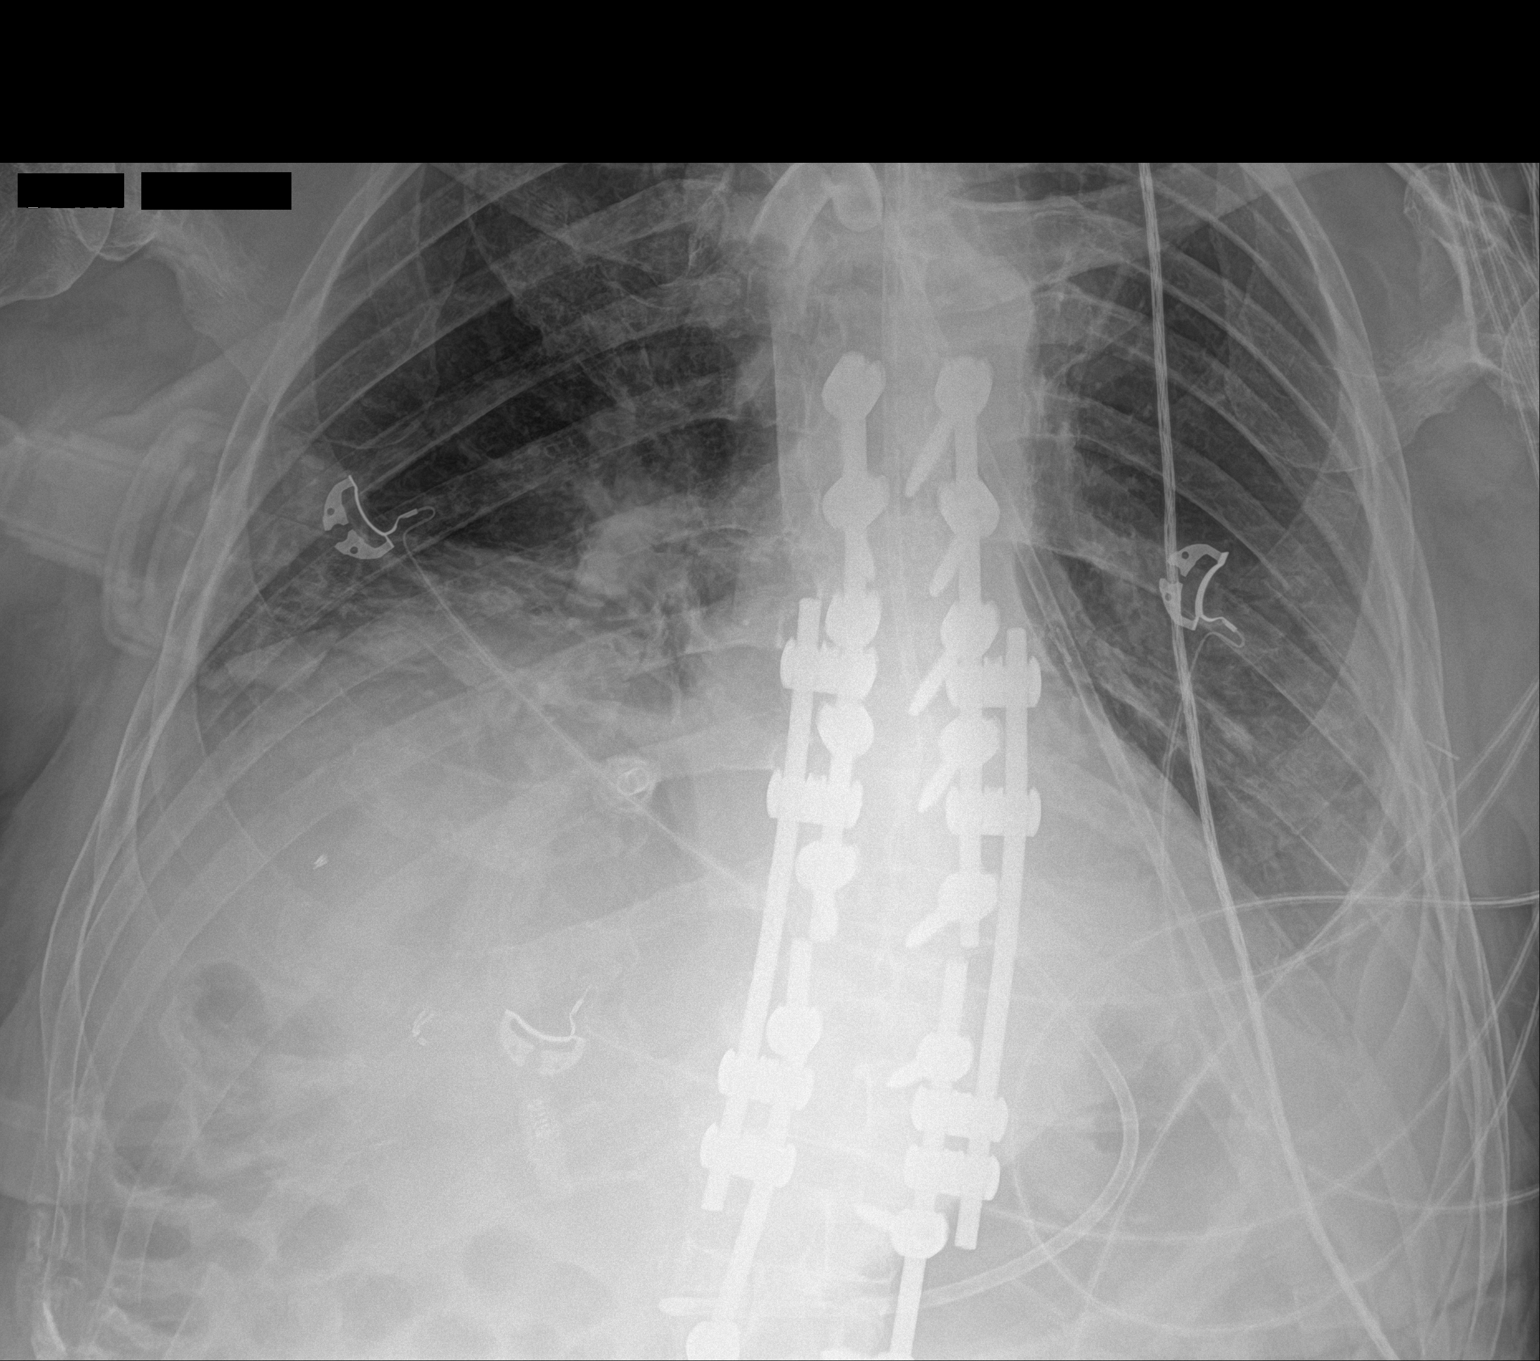
[im 2/2]
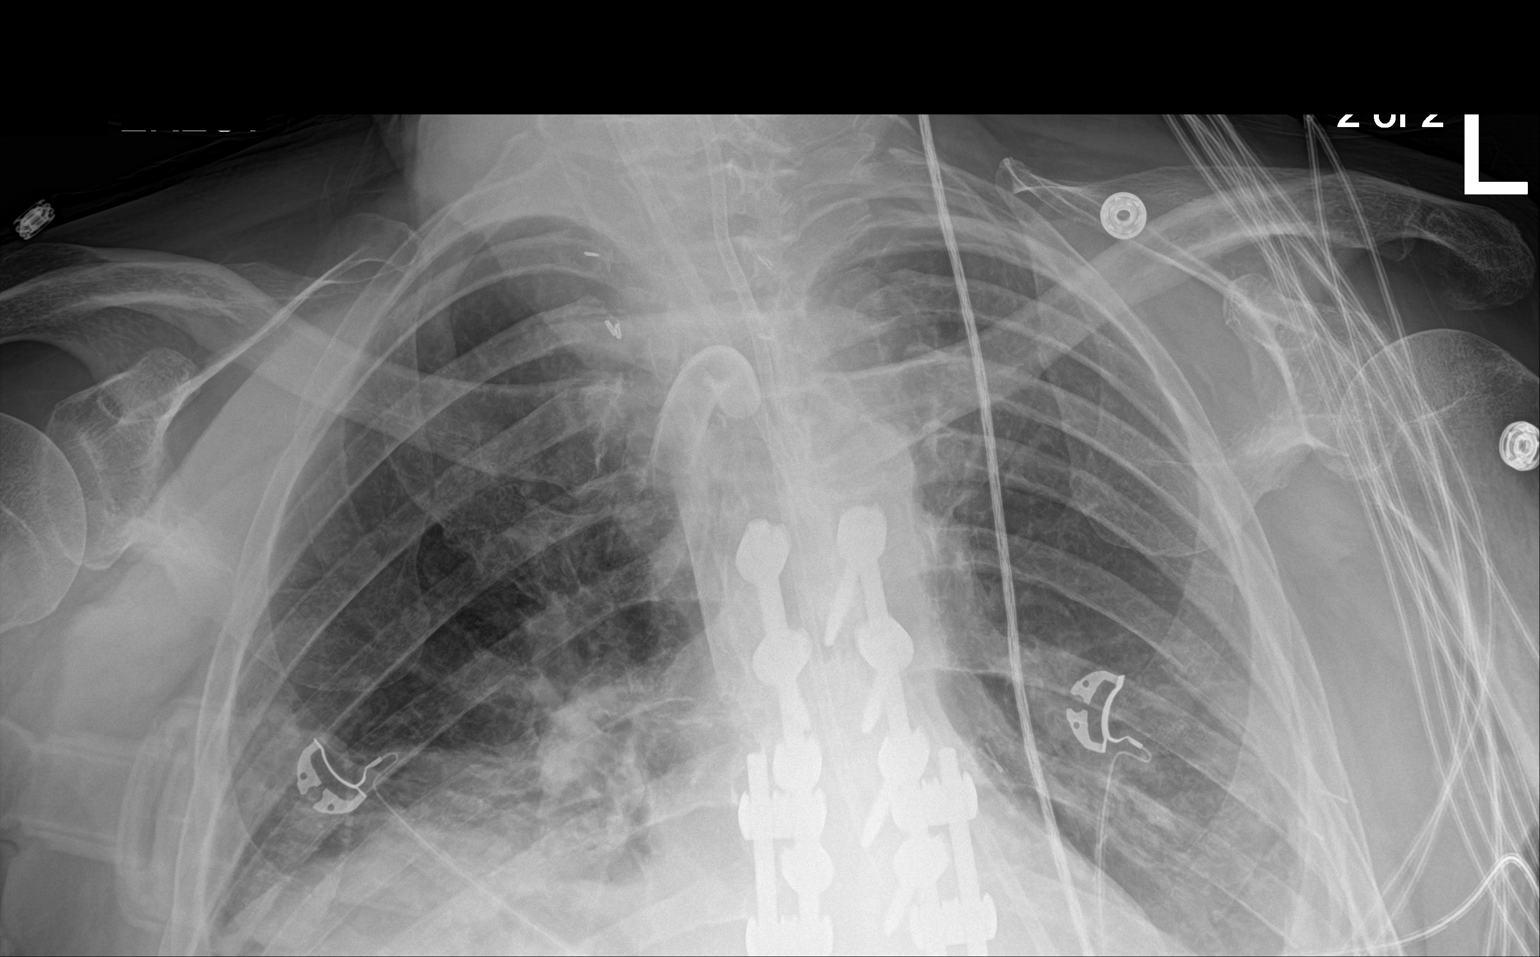

[2 of 2 positions shown; findings below may reference images not displayed]

FINDINGS: Nasoenteric feeding tube tip is seen in the expected distal body of
the stomach. Tracheostomy unchanged. Mild elevation of the right
hemidiaphragm again noted with associated right basilar atelectasis.
Small partially loculated left pleural effusion again noted,
unchanged. Lungs are otherwise clear. No pneumothorax. Cardiac size
within normal limits.
IMPRESSION: Nasoenteric feeding tube tip within the distal body of the stomach.

Otherwise stable examination with bibasilar atelectasis and small
partially loculated left pleural effusion.

## 2022-12-03 IMAGING — CT CT NECK W/ CM
4 of 6 series · 12 of 35 positions shown, 14 images · IV contrast (APPLIED)
Comparison: None.

CLINICAL DATA: Abnormal barium swallow

EXAM:
CT NECK WITH CONTRAST
TECHNIQUE: Multidetector CT imaging of the neck was performed using the
standard protocol following the bolus administration of intravenous
contrast.
CONTRAST:  100mL OMNIPAQUE IOHEXOL 300 MG/ML  SOLN

[Series 3: axial neck · axial · 0.63mm/px · z∈[-184,-82]mm · 2 of 153 slices shown, 3 images]
[im 51/153  soft-tissue]
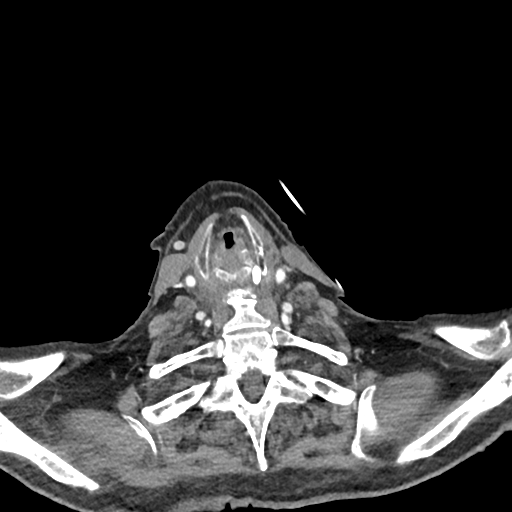
[im 51/153  bone]
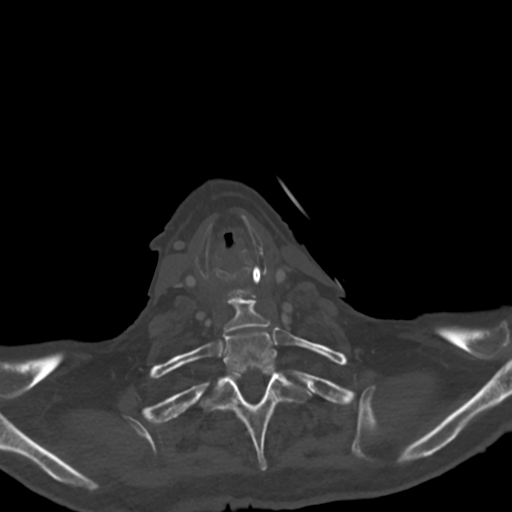
[im 102/153  bone]
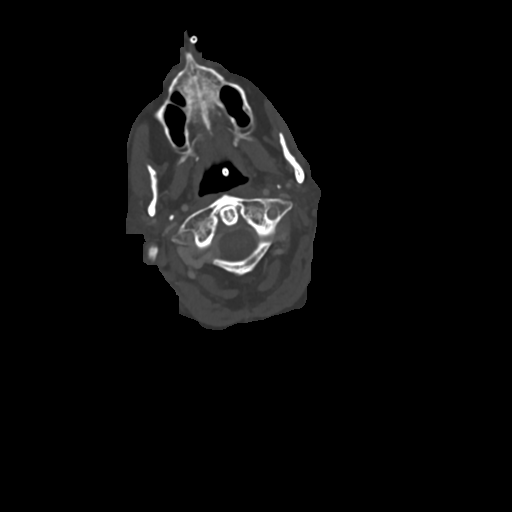

[Series 6: sag neck · sagittal · 0.64mm/px · 5 of 169 slices shown, 6 images]
[im 57/169  bone]
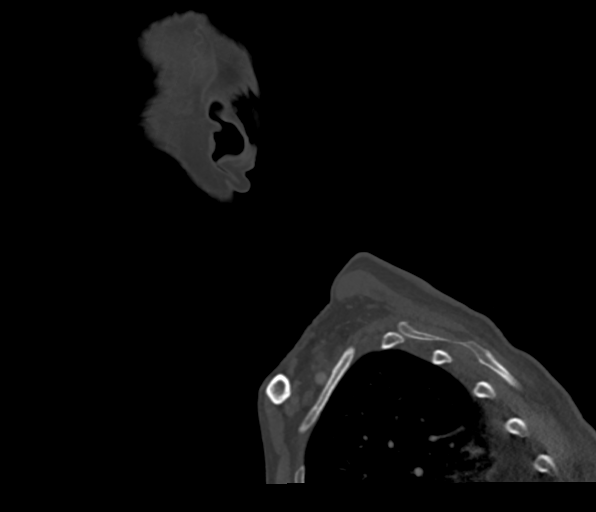
[im 71/169  bone]
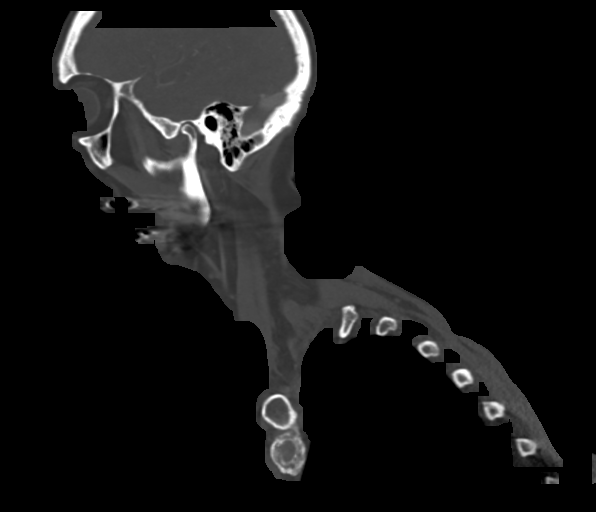
[im 85/169  soft-tissue]
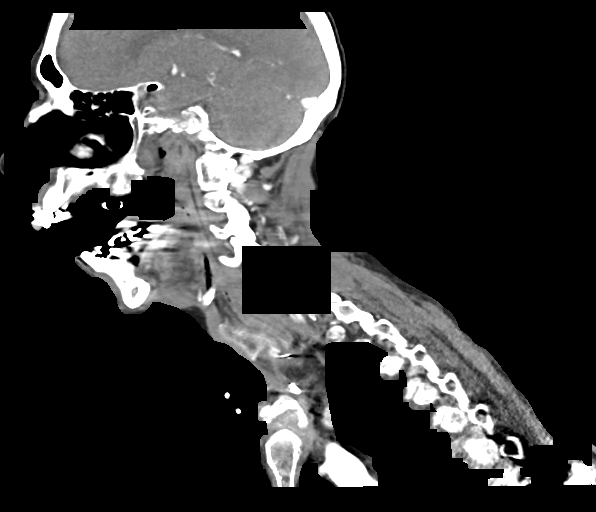
[im 85/169  bone]
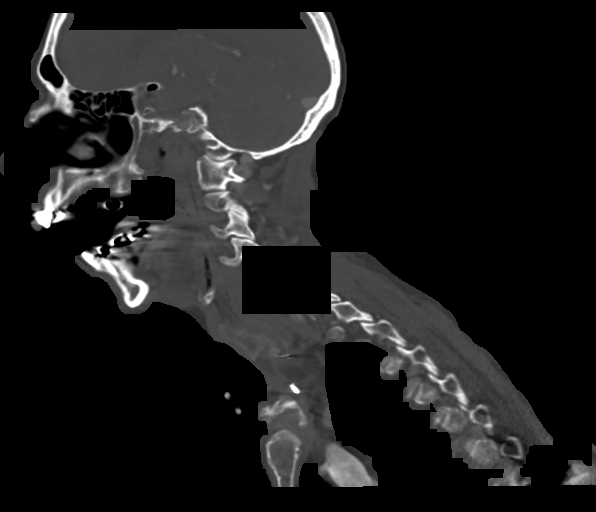
[im 99/169  bone]
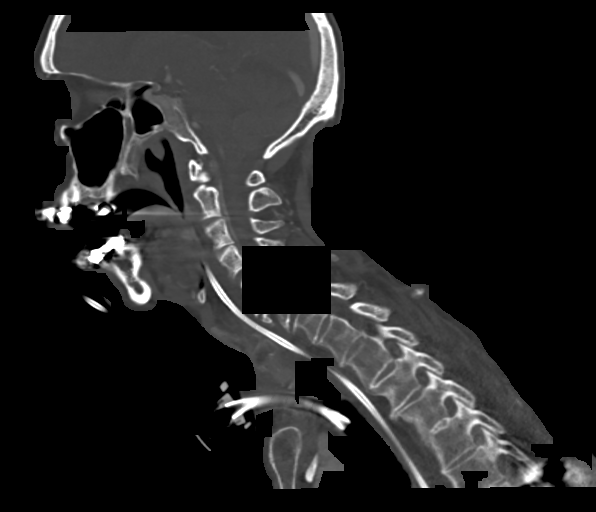
[im 113/169  bone]
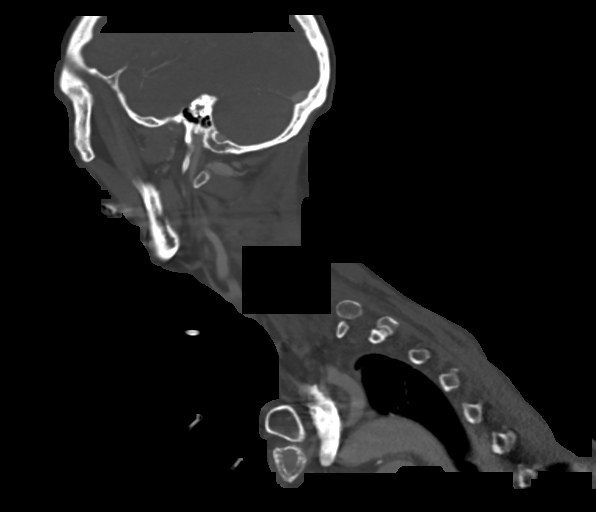

[Series 7: cor neck · coronal · 0.63mm/px · 3 of 192 slices shown]
[im 39/192  bone]
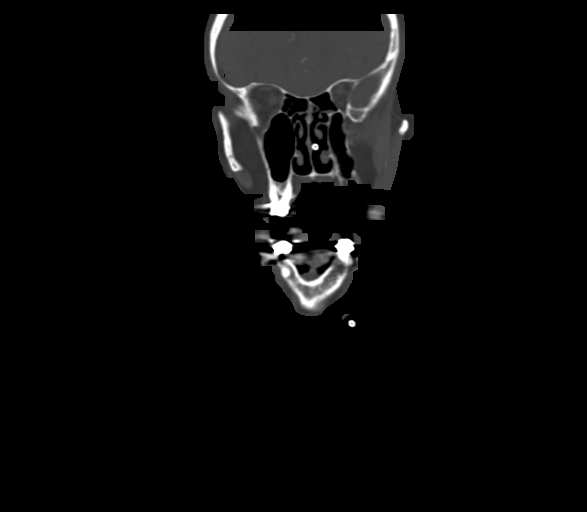
[im 77/192  bone]
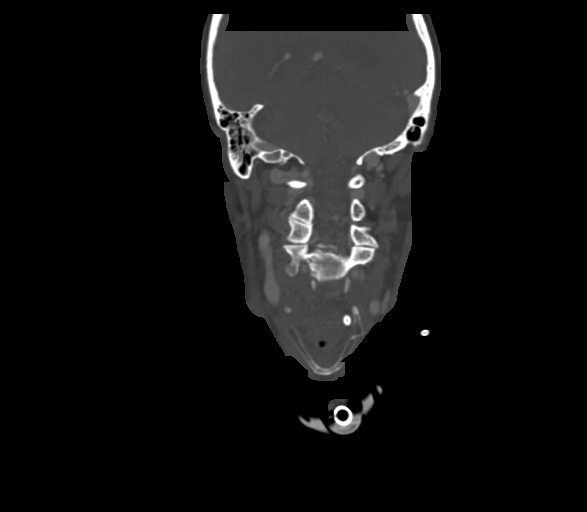
[im 115/192  bone]
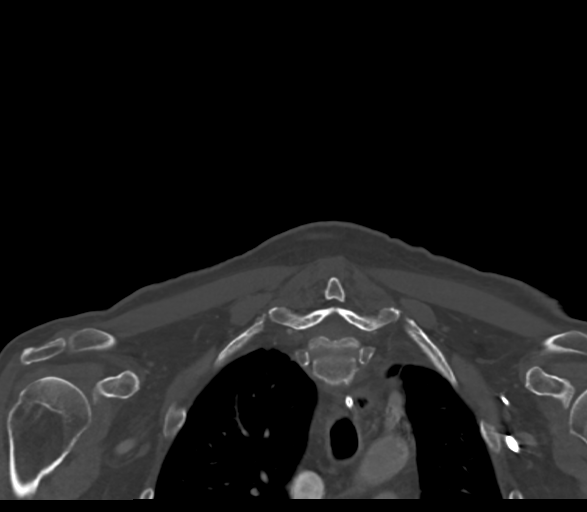

[Series 8: ax oropharynx · axial · 0.72mm/px · z∈[-187,-83]mm · 2 of 156 slices shown]
[im 52/156  bone]
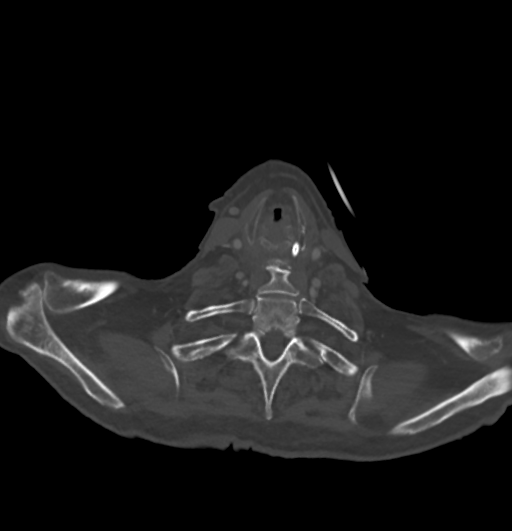
[im 104/156  bone]
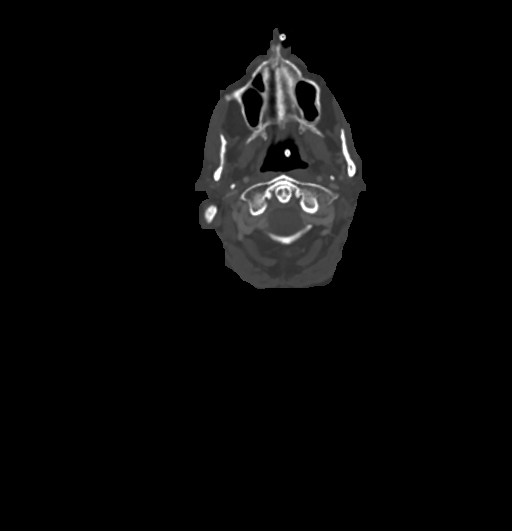

[12 of 35 positions shown; findings below may reference images not displayed]

FINDINGS: Pharynx and larynx: There is a prevertebral soft tissue mass at the
C4-5 level, which is the level of the supraglottic larynx. This is
to the right of the esophageal catheter. The abnormality measures
approximately 2.6 x 1.0 x 2.8 cm. The supraglottic airway is
narrowed. There is nodularity of the vocal folds.

Salivary glands: No inflammation, mass, or stone.

Thyroid: Surgically absent

Lymph nodes: None enlarged or abnormal density.

Vascular: Negative.

Limited intracranial: Negative.

Visualized orbits: Negative.

Mastoids and visualized paranasal sinuses: Clear.

Skeleton: No acute or aggressive process.

Upper chest: Multifocal opacity in the right middle lobe,
incompletely visualized. Tracheostomy tube in expected position.

Other: None.
IMPRESSION: 1. Prevertebral soft tissue mass at the C4-5 level, the level of the
supraglottic larynx, measuring approximately 2.6 x 1.0 x 2.8 cm.
Additionally, there is nodularity of the vocal folds. The appearance
is most consistent with a laryngeal carcinoma. Endoscopic
visualization recommended.
2. Multifocal opacity in the right middle lobe, incompletely
visualized. This may indicate aspiration or pneumonia.

## 2022-12-07 IMAGING — DX DG ABDOMEN 1V
1 series · 1 of 1 positions shown · non-contrast
Comparison: 09/16/2020.

CLINICAL DATA: Feeding tube placement.

EXAM:
ABDOMEN - 1 VIEW

[abdomen kub]
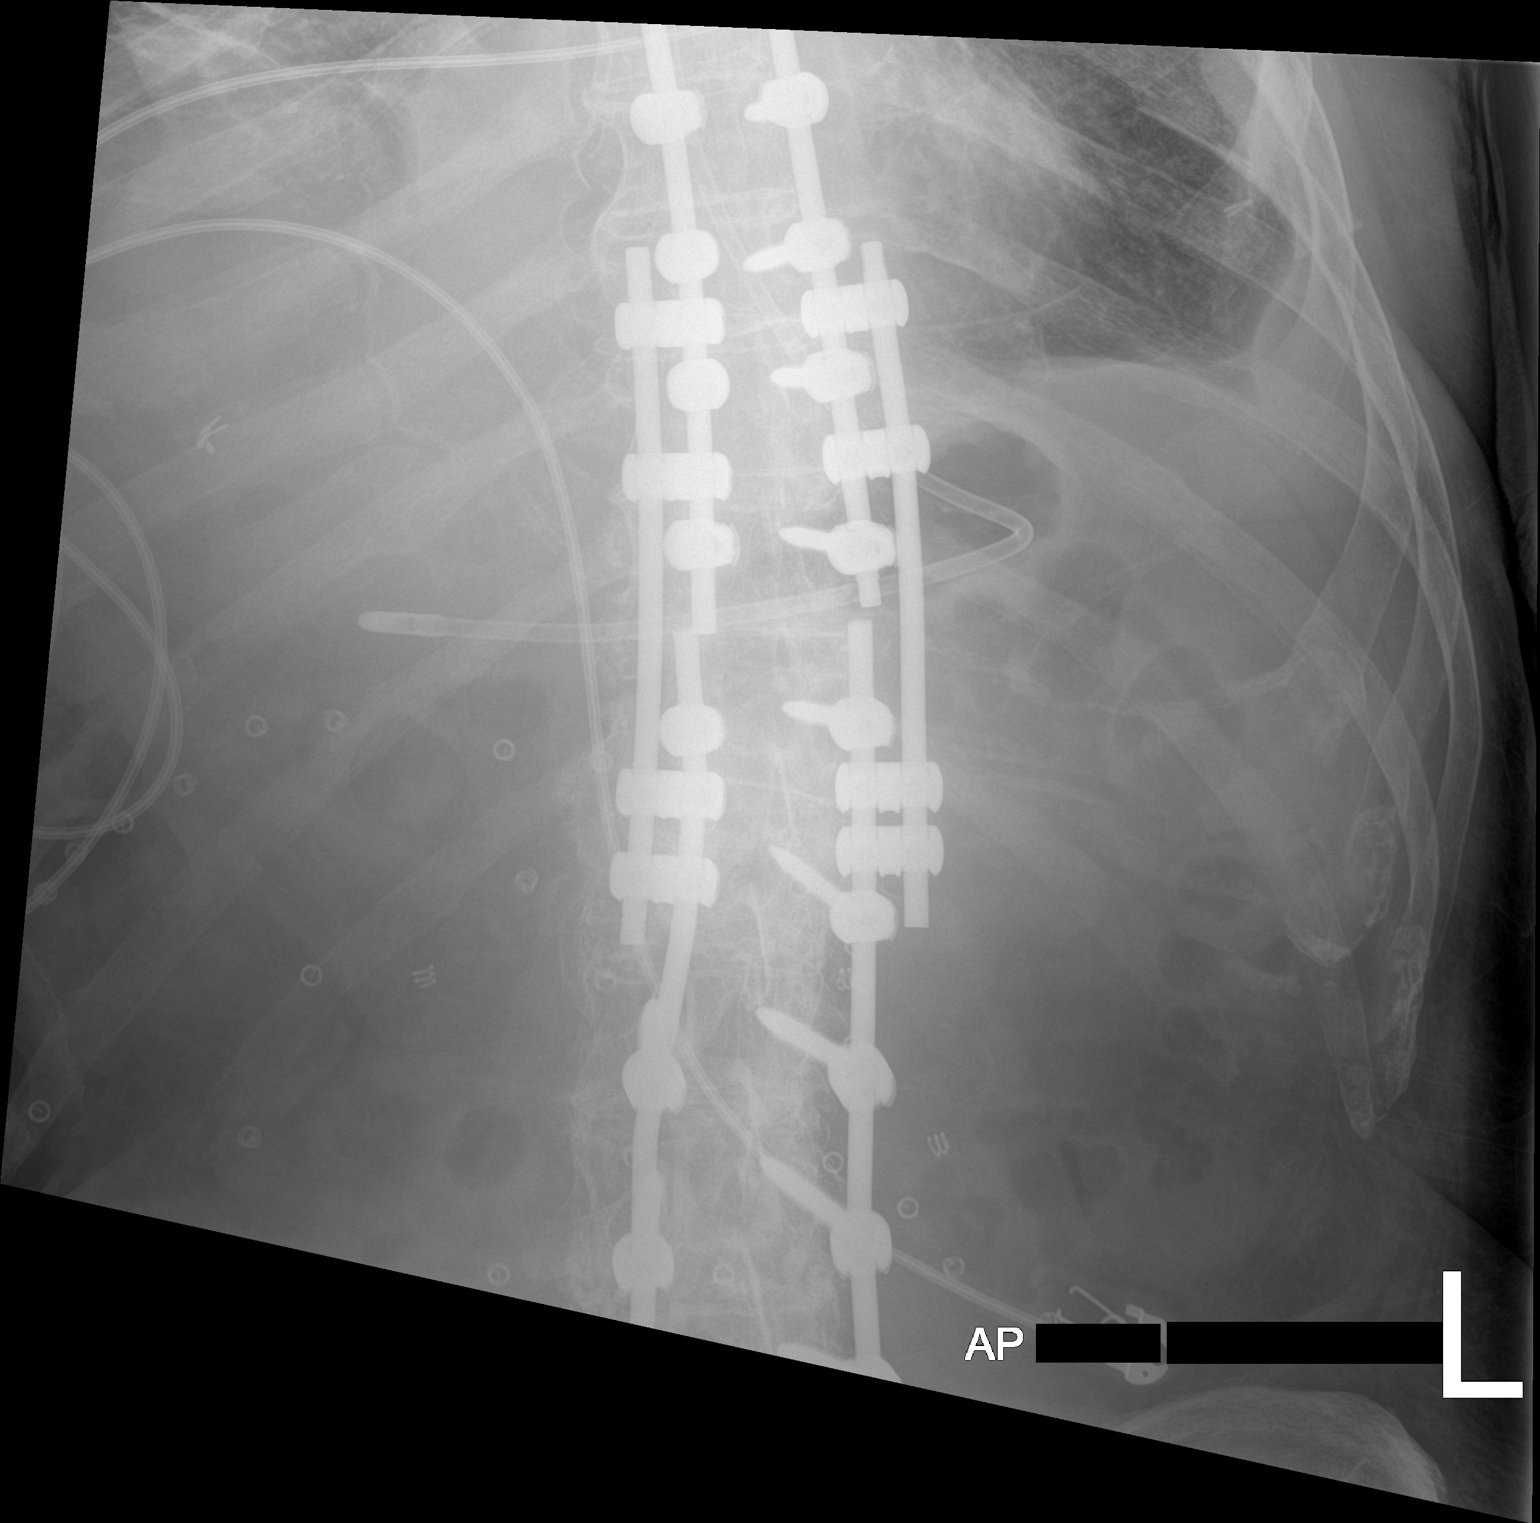

[1 of 1 positions shown; findings below may reference images not displayed]

FINDINGS: Interim removal of NG tube and placement of feeding tube. Feeding
tube tip noted over the stomach. Basilar atelectasis. Left pleural
effusion cannot be excluded. Postsurgical changes thoracolumbar
spine. Surgical clips noted over the abdomen.
IMPRESSION: Interim removal of NG tube and placement of feeding tube. Feeding
tube tip noted over the stomach.

## 2022-12-10 IMAGING — DX DG ABDOMEN 1V
1 series · 1 of 1 positions shown · non-contrast
Comparison: None.

CLINICAL DATA: NG tube placement

EXAM:
ABDOMEN - 1 VIEW

[abdomen kub]
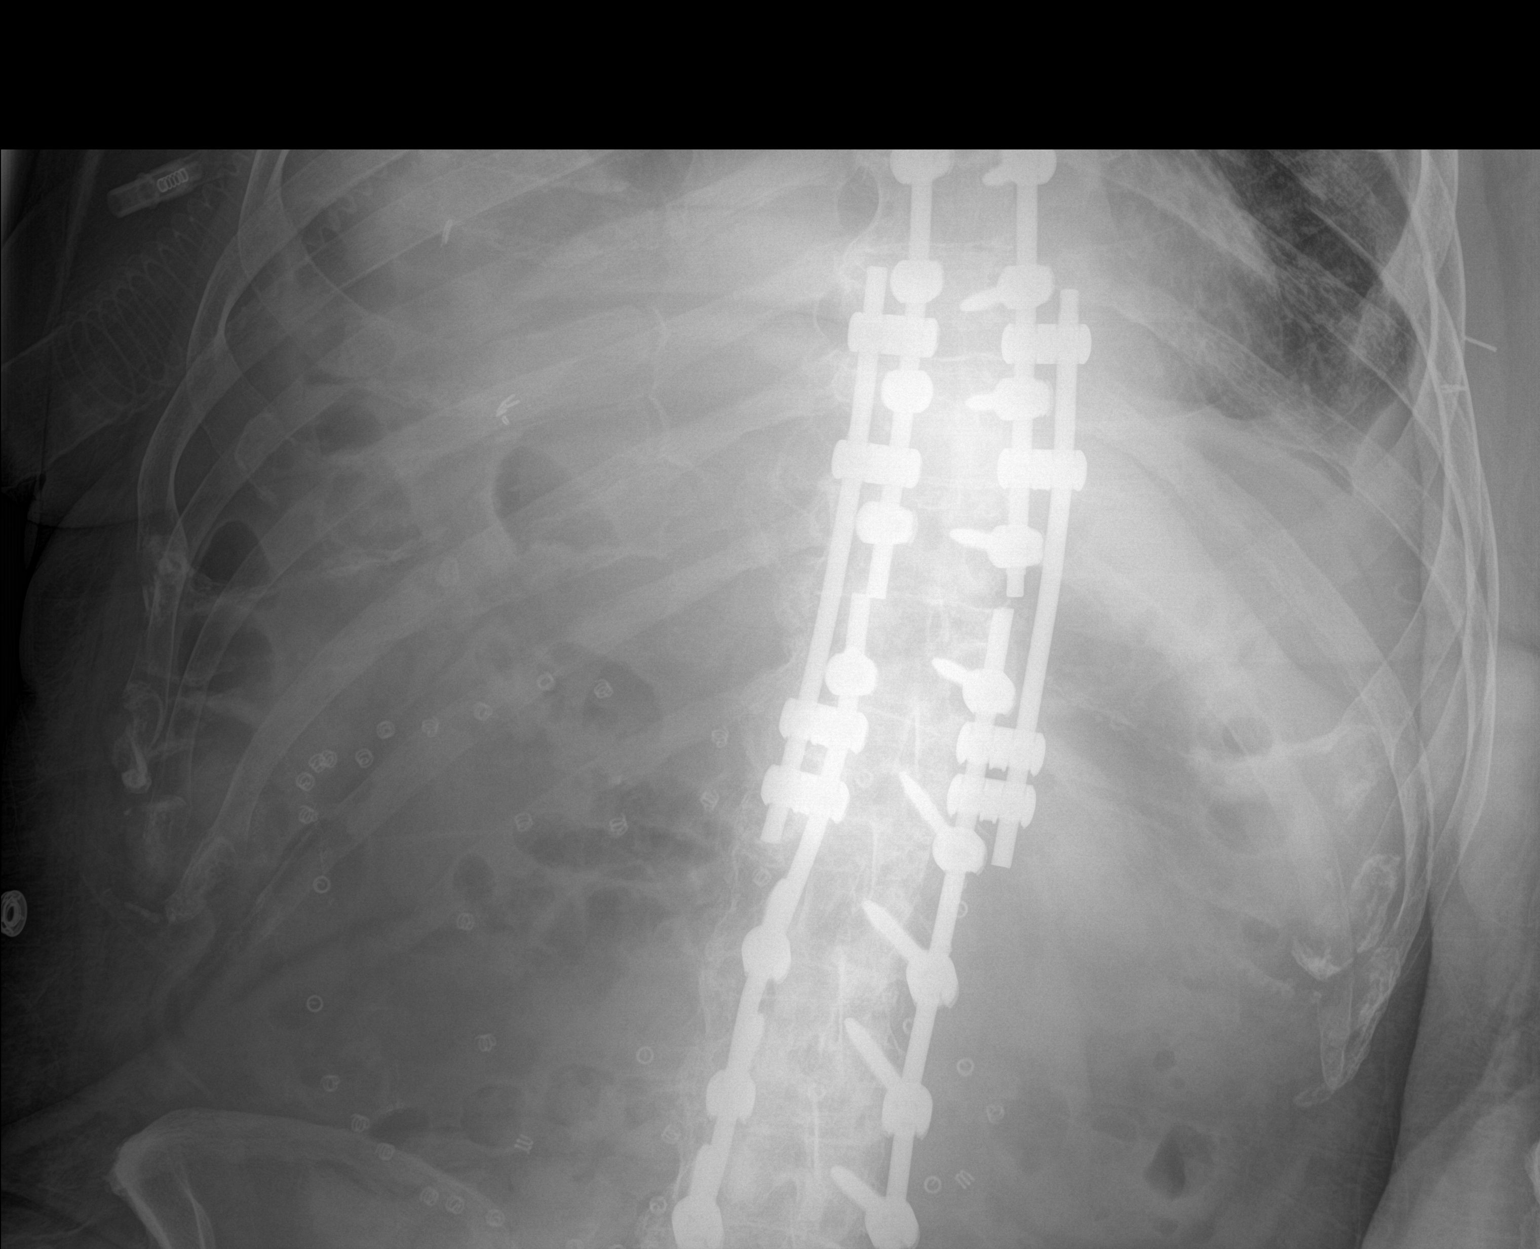

[1 of 1 positions shown; findings below may reference images not displayed]

FINDINGS: Enteric tube overlies the right lung base and is likely within and
airway. Unremarkable bowel gas pattern. Extensive thoracolumbar
fusion construct.
IMPRESSION: Enteric tube is likely malposition within right lower lobe airway.

These results will be called to the ordering clinician or
representative by the Radiologist Assistant, and communication
documented in the PACS or [[email or web address]].

## 2022-12-10 IMAGING — DX DG CHEST 1V PORT
1 series · 1 of 1 positions shown · non-contrast
Comparison: 09/21/2020

CLINICAL DATA: Aspiration pneumonia

EXAM:
PORTABLE CHEST 1 VIEW

[chest ap]
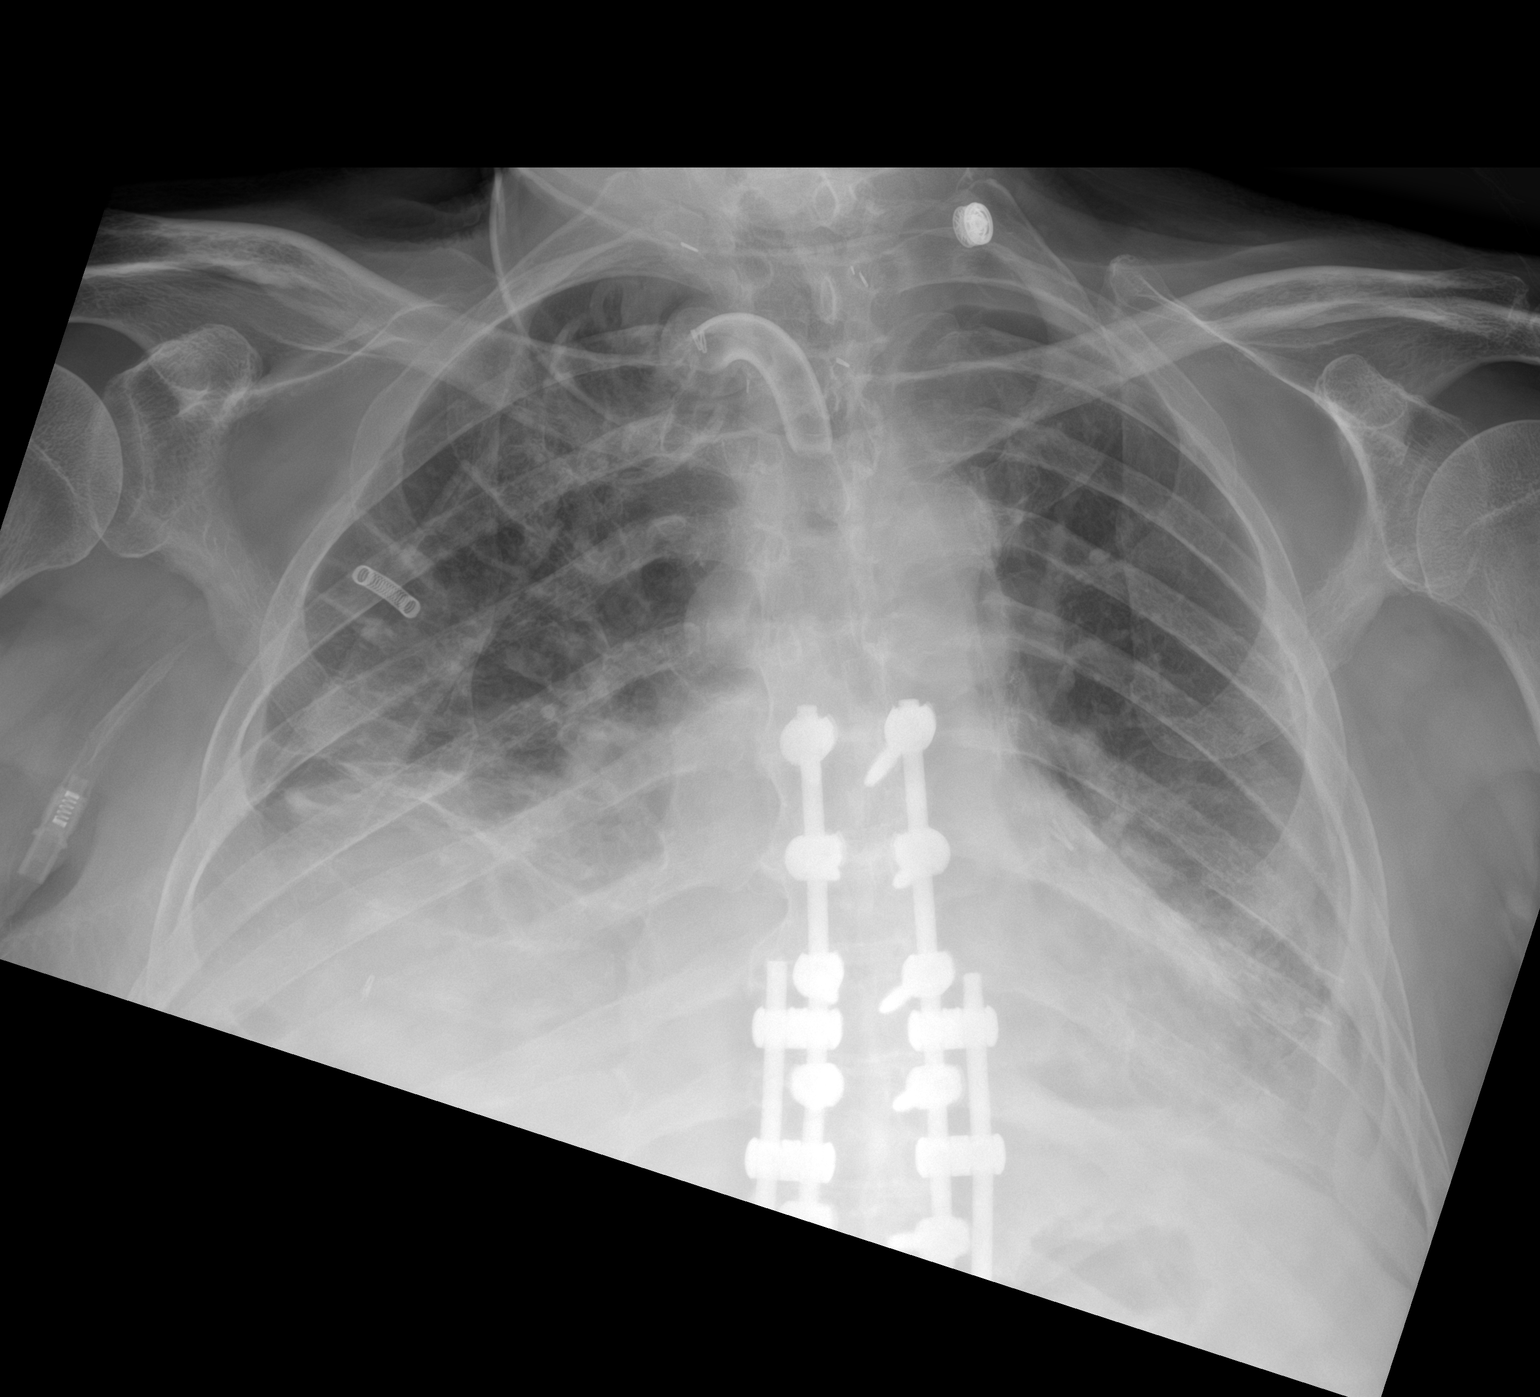

[1 of 1 positions shown; findings below may reference images not displayed]

FINDINGS: Single frontal view of the chest demonstrates stable tracheostomy
tube. The enteric catheter seen previously has been removed. Cardiac
silhouette is stable. Persistent bibasilar consolidation and
effusions, right greater than left. No evidence of pneumothorax. No
acute bony abnormalities. Stable postsurgical changes of the
thoracic spine.
IMPRESSION: 1. Stable bibasilar consolidation and effusions, right greater than
left. Findings could reflect airspace disease, aspiration, or
atelectasis.

## 2022-12-11 IMAGING — RF DG NASO G TUBE PLC W/FL W/RAD
3 series · 3 of 3 positions shown · non-contrast
Comparison: None.

CLINICAL DATA: Feeding tube needed due to dysphagia and
tracheostomy in place.

EXAM:
NASO G TUBE PLACEMENT WITH FL AND WITH RAD
FLUOROSCOPY TIME:  Fluoroscopy Time:  3 minutes 12 seconds
64.4 mGy

[Series 1: cp_standard · 0.25mm/px · 1 of 1 slices shown (1 of 3)]
[im 1/1]
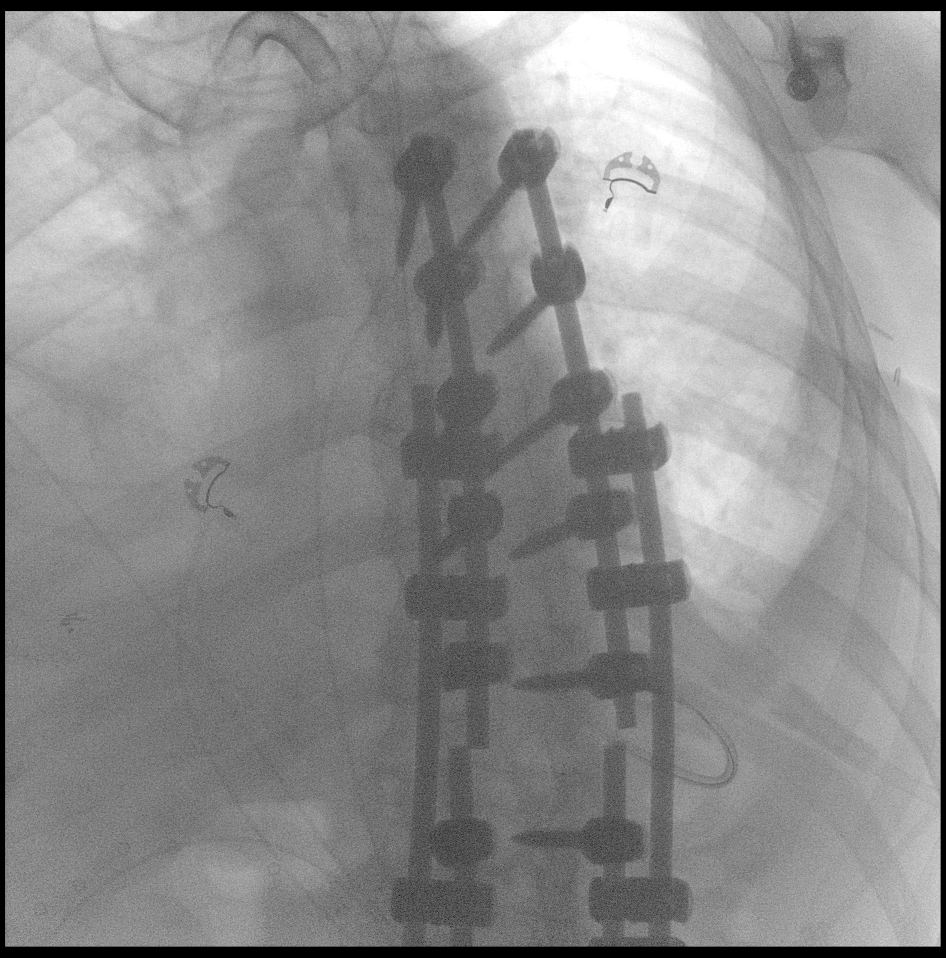

[Series 2: cp_standard · 0.25mm/px · 1 of 1 slices shown (2 of 3)]
[im 1/1]
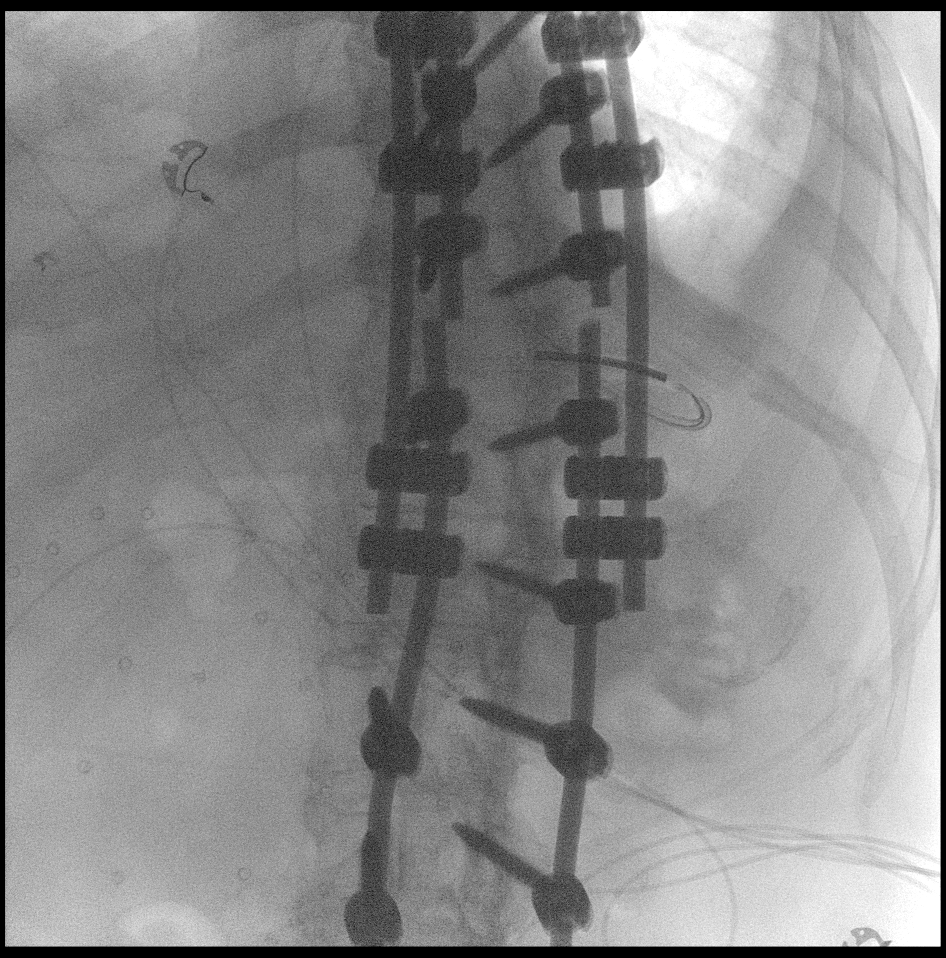

[Series 3: cp_standard · 0.25mm/px · 1 of 1 slices shown (3 of 3)]
[im 1/1]
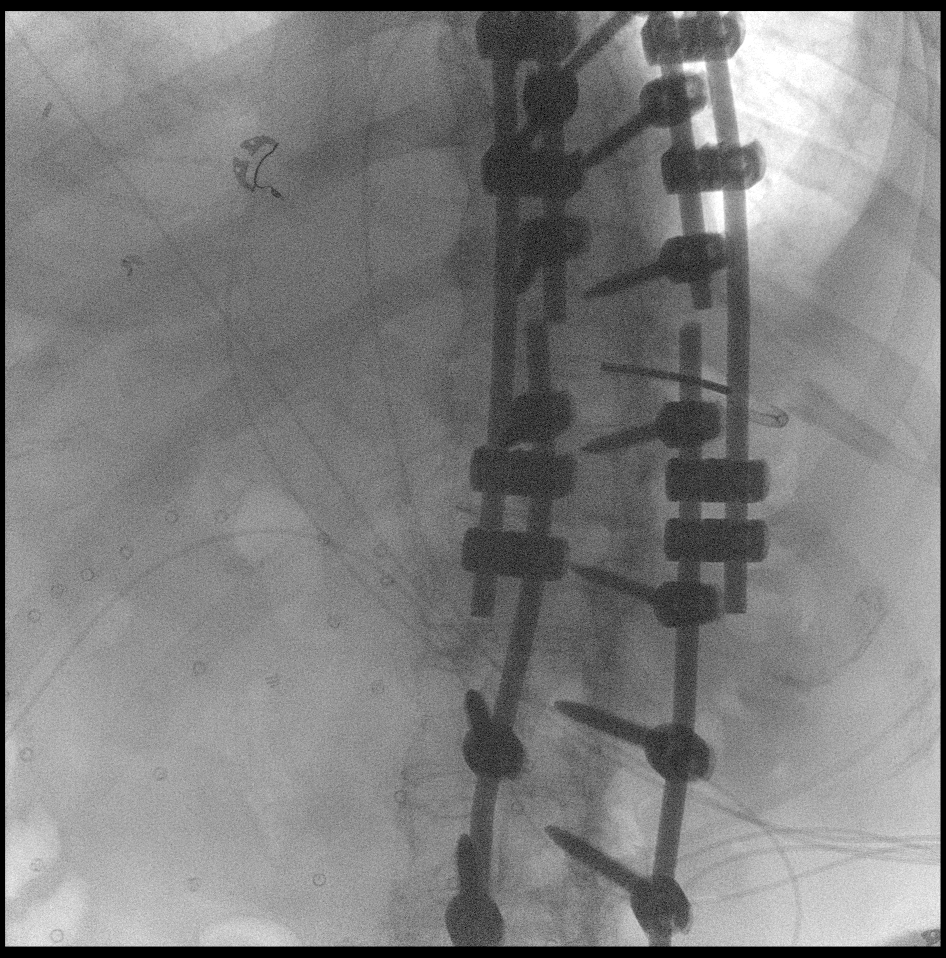

[3 of 3 positions shown; findings below may reference images not displayed]

FINDINGS: Using fluoroscopic guidance, a weighted tip feeding tube was
advanced to the stomach. Stylet was removed. Tubing was taped at the
nose. Patient was returned to the floor in stable condition.
IMPRESSION: Successful placement of a weighted tip feeding tube into the stomach
using fluoroscopic guidance.

## 2022-12-12 IMAGING — DX DG ABD PORTABLE 1V
1 series · 2 of 2 positions shown · non-contrast
Comparison: None.

CLINICAL DATA: Status post nasogastric catheter placement

EXAM:
PORTABLE ABDOMEN - 1 VIEW

[Series 1: abdomen · 0.14mm/px · 2 of 2 slices shown]
[im 1/2]
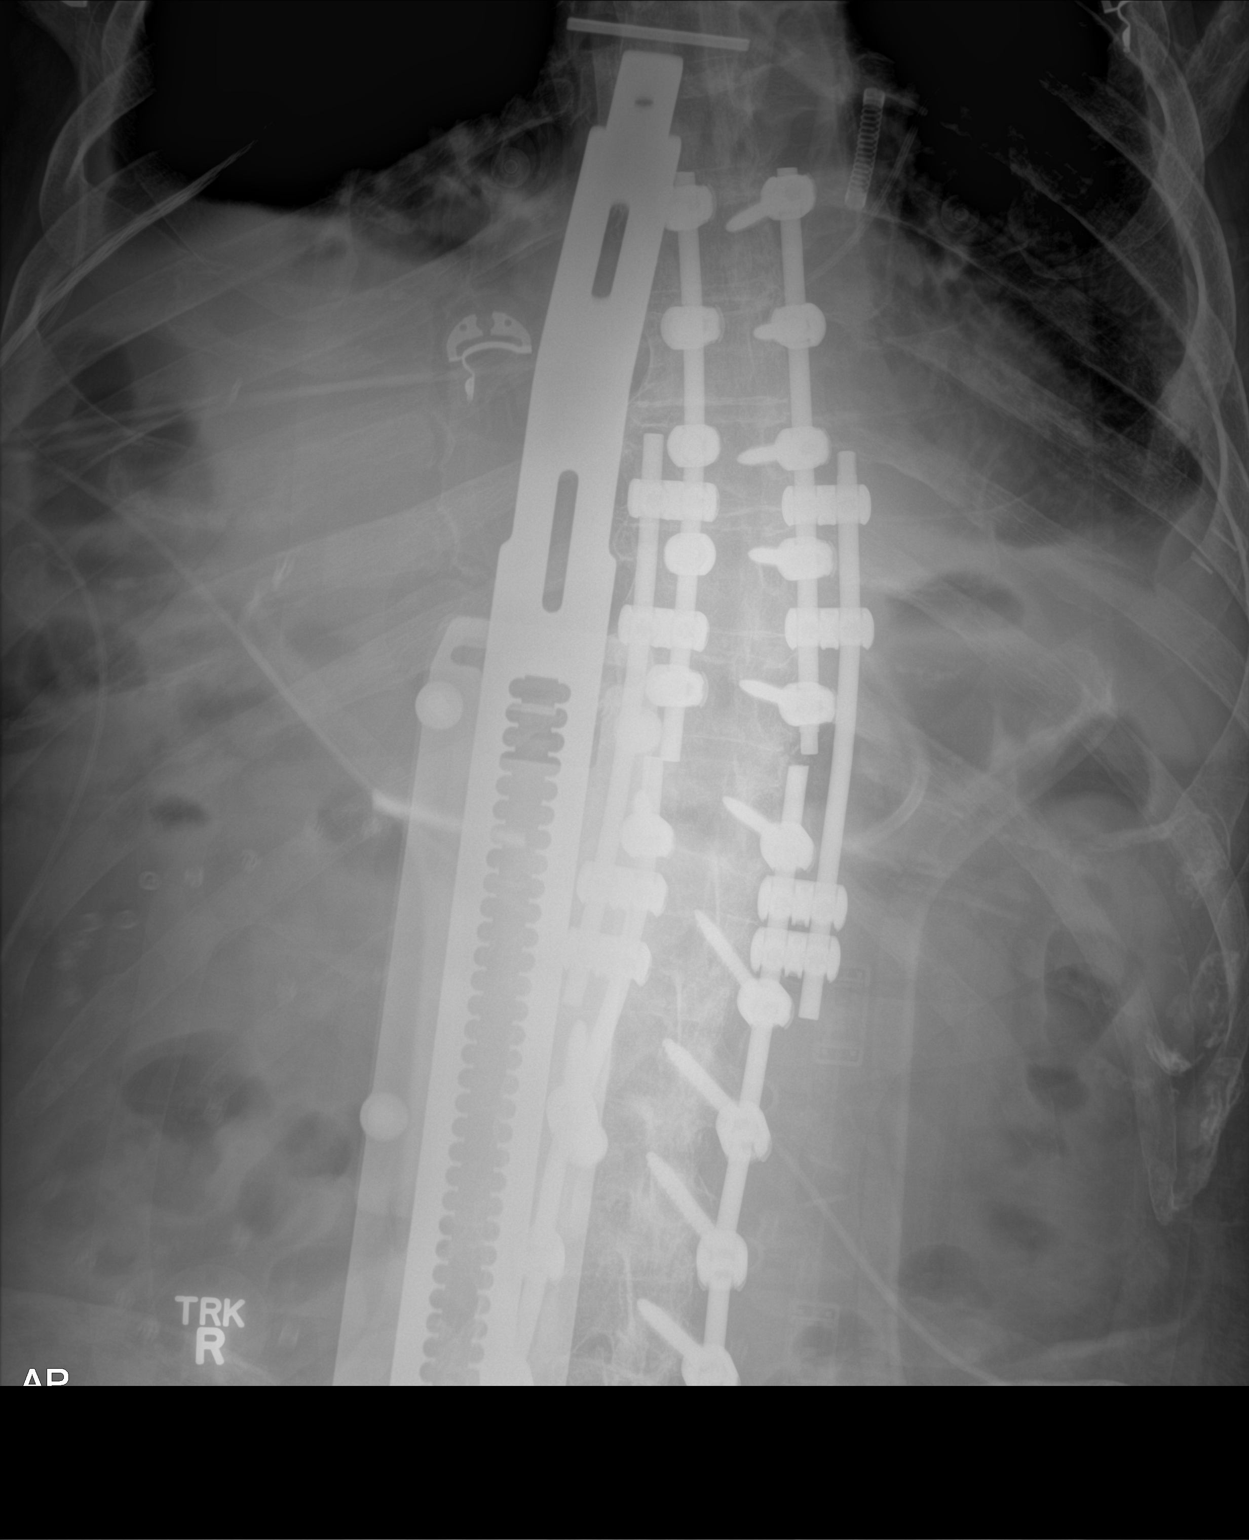
[im 2/2]
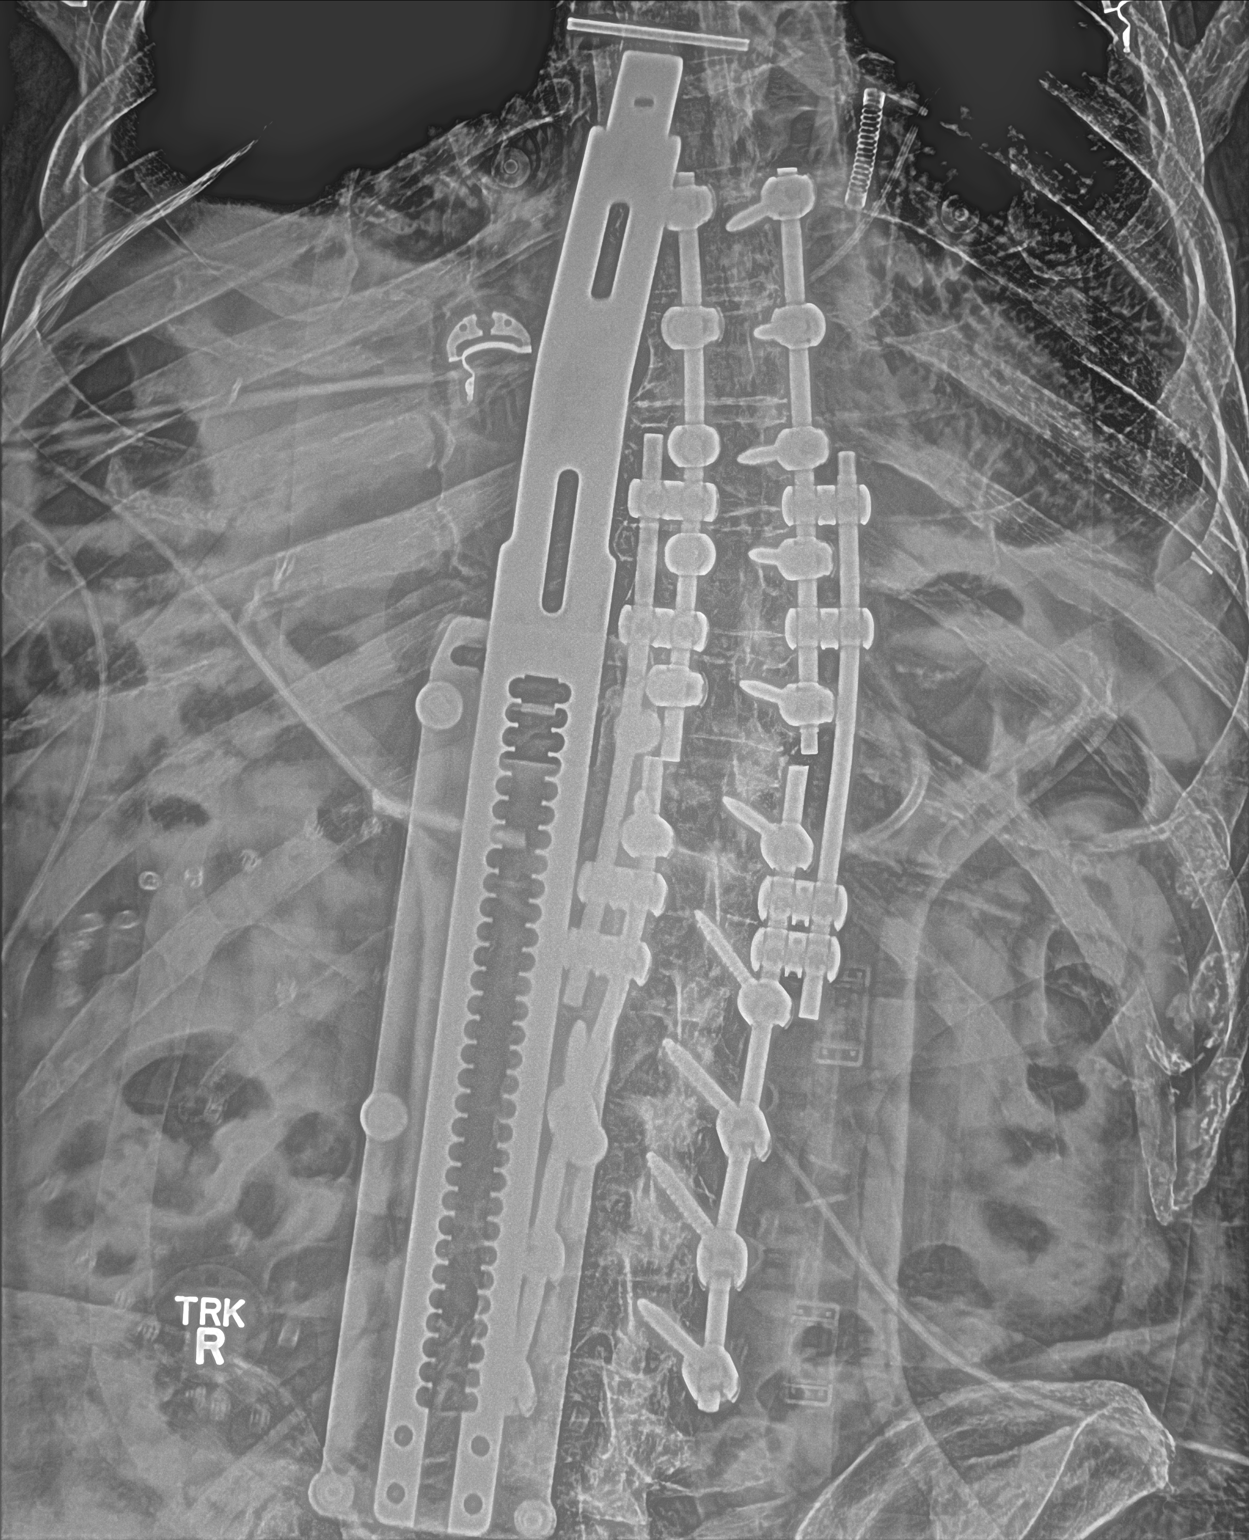

[2 of 2 positions shown; findings below may reference images not displayed]

FINDINGS: Scattered large and small bowel gas is noted. New feeding catheter
is noted with the weighted tip in the distal stomach. Extensive
hardware is noted overlying the thoracolumbar spine as well as
extrinsic to the patient.
IMPRESSION: Feeding catheter within the distal stomach.

## 2022-12-13 IMAGING — DX DG CHEST 1V PORT
1 series · 1 of 1 positions shown · non-contrast
Comparison: 10/07/2020

CLINICAL DATA: Encounter for tracheostomy tube

EXAM:
PORTABLE CHEST 1 VIEW

[chest]
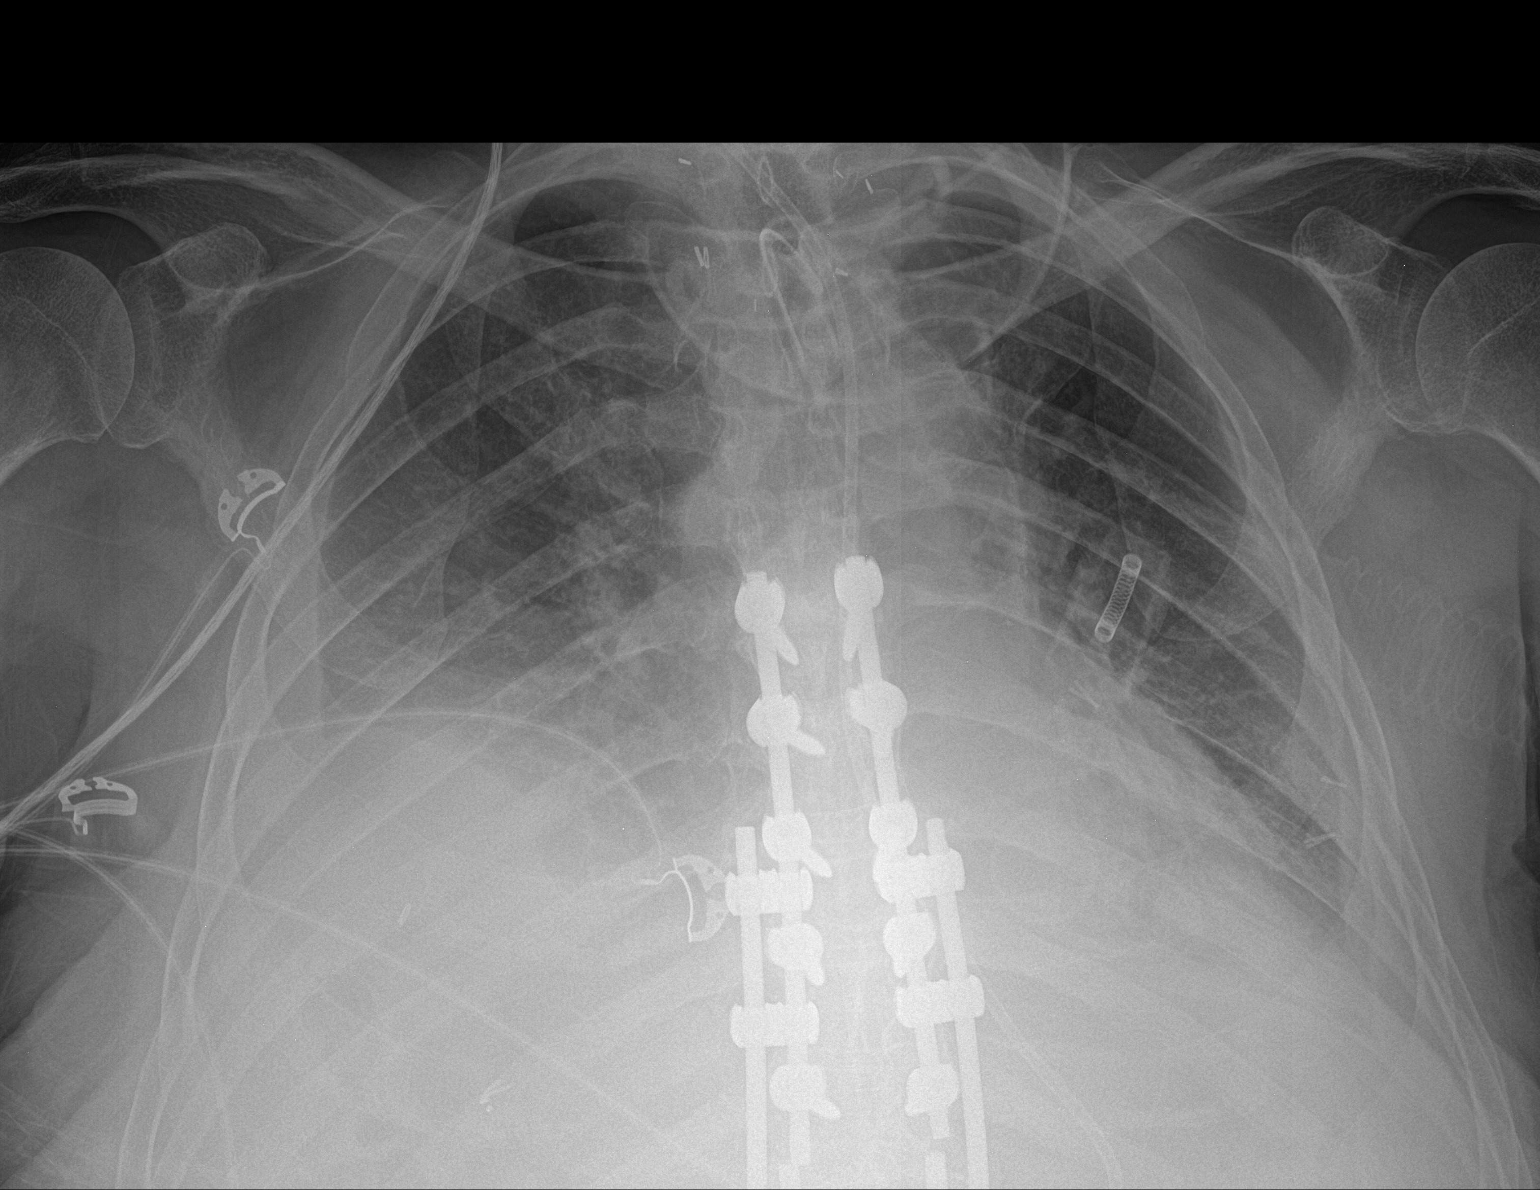

[1 of 1 positions shown; findings below may reference images not displayed]

FINDINGS: Tracheostomy tube in place. The feeding tube at least reaches the
stomach.

Hazy bilateral lower chest attributed to layering pleural effusions,
potentially large on the right where there has been progression. No
visible pneumothorax. Stable partially obscured heart size. Obscured
right more than left lower lungs.
IMPRESSION: Bilateral pleural effusion with increase on the right. The lower
lungs are largely obscured.

## 2022-12-13 IMAGING — CT CT NECK W/ CM
3 of 4 series · 13 of 33 positions shown, 16 images · IV contrast (omnipaque)
Comparison: CT neck 09/30/2020

CLINICAL DATA: Concern for progressive subglottic stenosis and
obstruction. Tracheostomy. Recent laryngoscopy demonstrated no
discrete mass lesion of the mucosa. There is fullness of the
posterior pharyngeal wall on the right which was biopsied. No
malignancy detected on pathology.

EXAM:
CT NECK WITH CONTRAST
TECHNIQUE: Multidetector CT imaging of the neck was performed using the
standard protocol following the bolus administration of intravenous
contrast.
CONTRAST:  75mL OMNIPAQUE IOHEXOL 300 MG/ML  SOLN

[Series 3: neck 2.0 st · axial · 0.48mm/px · z∈[-192,-40]mm · 5 of 114 slices shown, 7 images]
[im 19/114  soft-tissue]
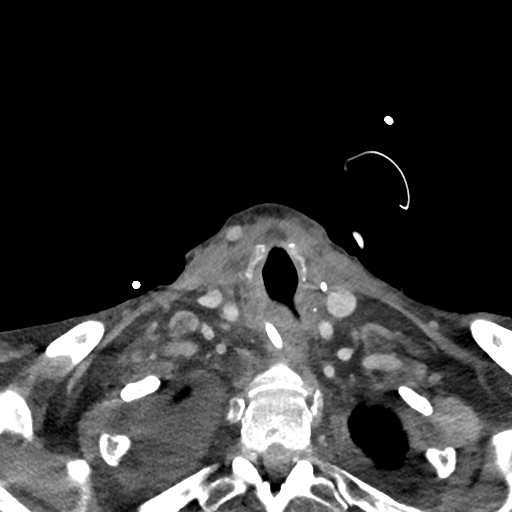
[im 19/114  bone]
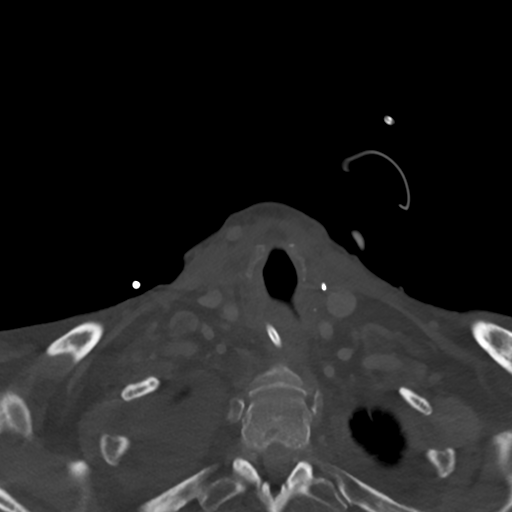
[im 38/114  bone]
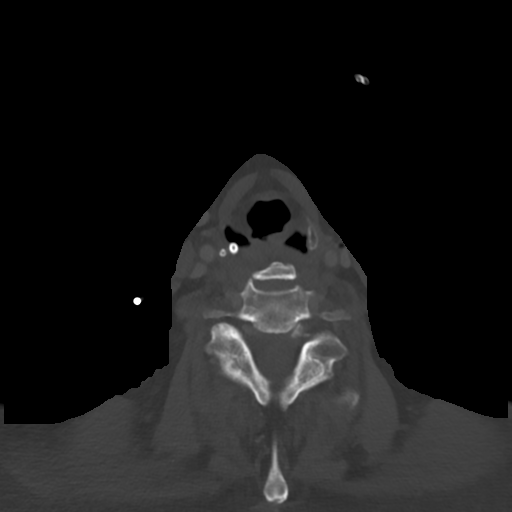
[im 57/114  bone]
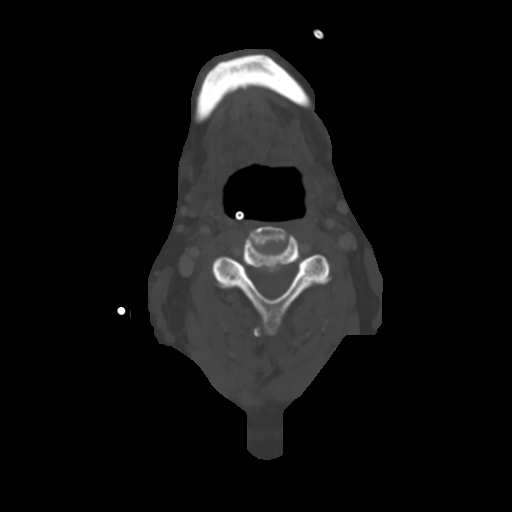
[im 76/114  bone]
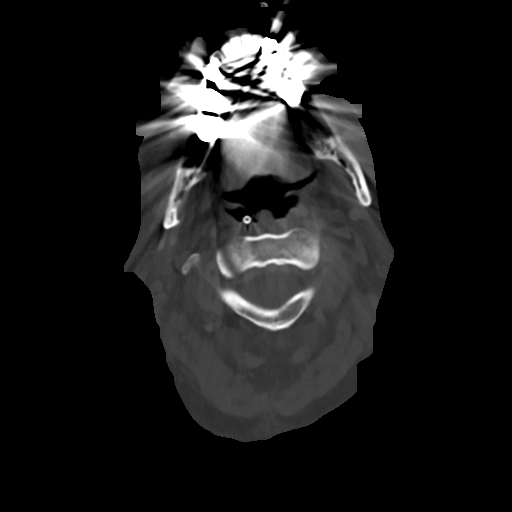
[im 95/114  soft-tissue]
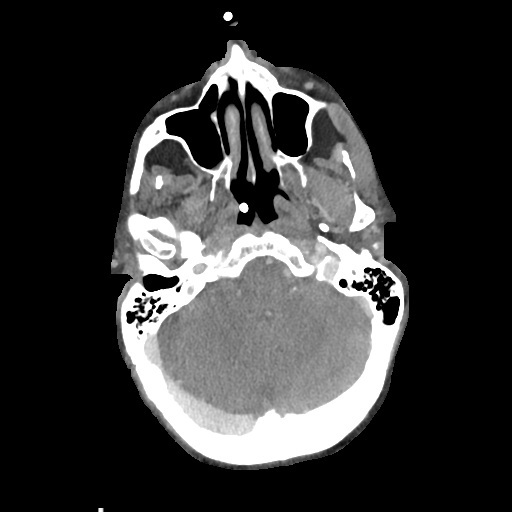
[im 95/114  bone]
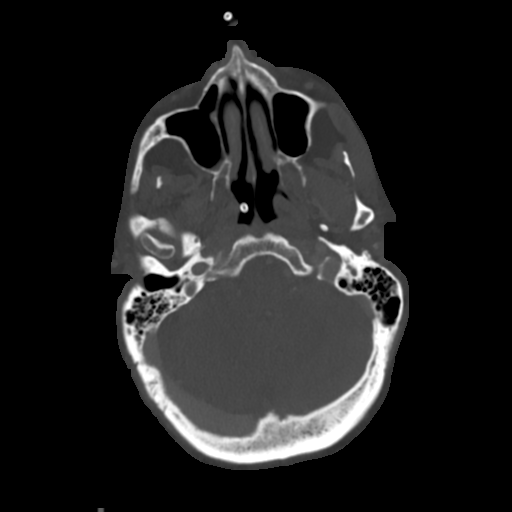

[Series 6: sagittal · sagittal · 0.44mm/px · 5 of 101 slices shown, 6 images]
[im 34/101  bone]
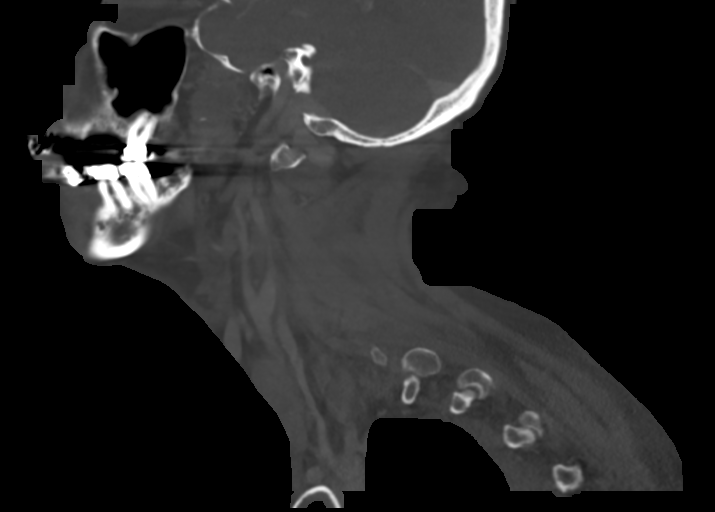
[im 42/101  bone]
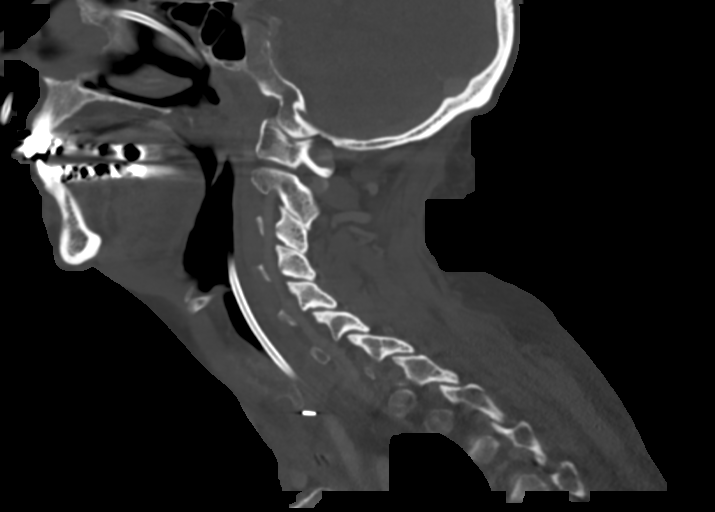
[im 51/101  soft-tissue]
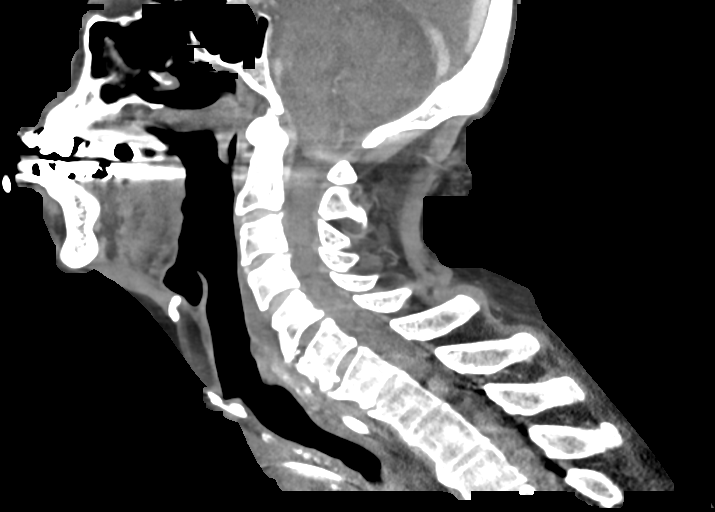
[im 51/101  bone]
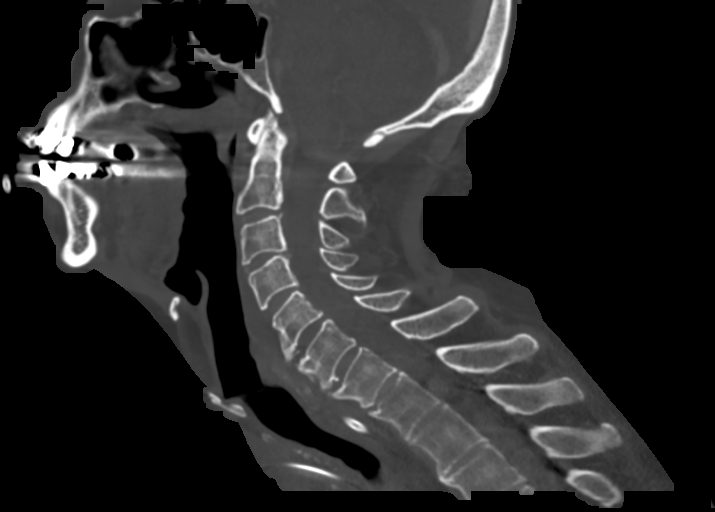
[im 59/101  bone]
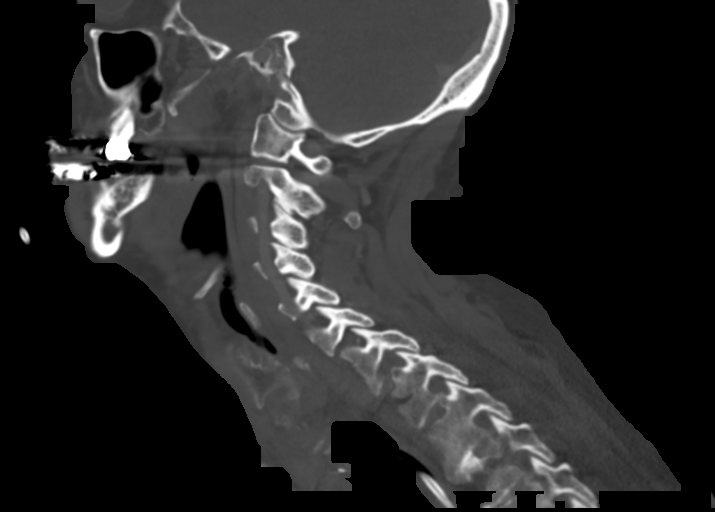
[im 67/101  bone]
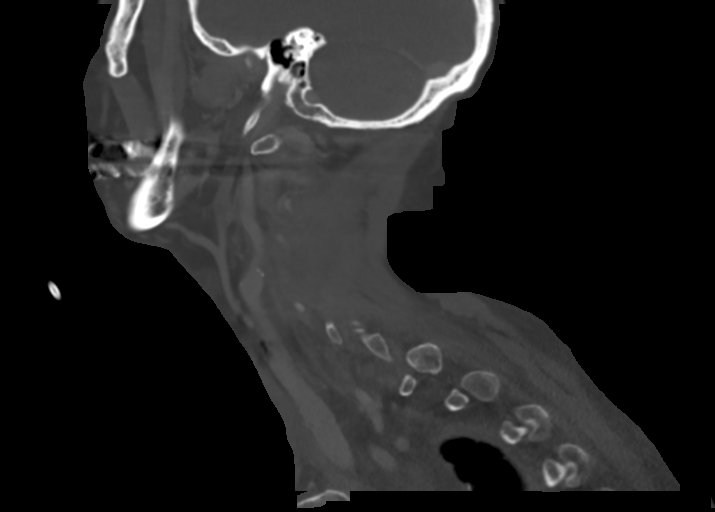

[Series 7: coronal · coronal · 0.44mm/px · 3 of 147 slices shown]
[im 30/147  bone]
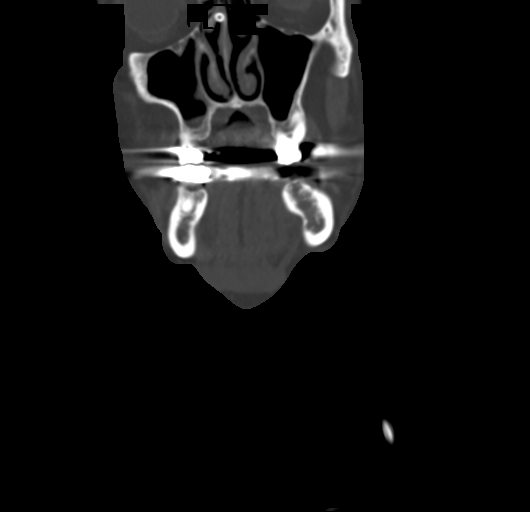
[im 59/147  bone]
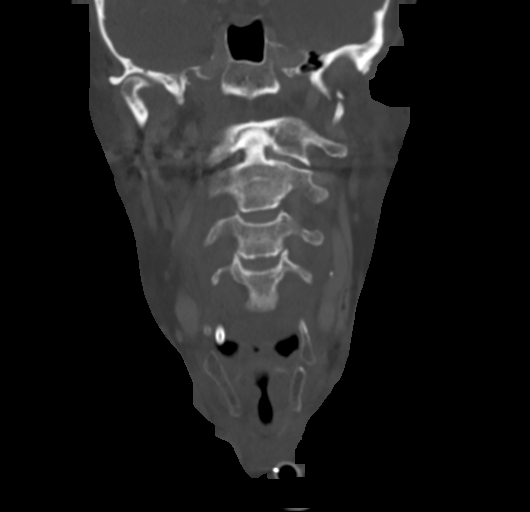
[im 88/147  bone]
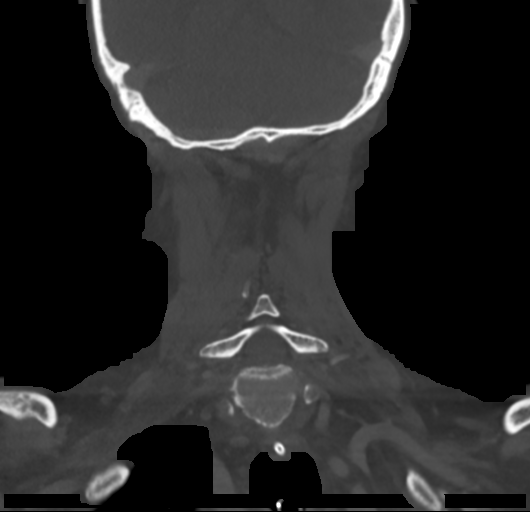

[13 of 33 positions shown; findings below may reference images not displayed]

FINDINGS: Pharynx and larynx: Tracheostomy again noted in satisfactory
position. NG tube also in place in the esophagus.

Previously noted fullness in the retropharyngeal soft tissues on the
right has significantly improved in the interval. No discrete mass
identified. Epiglottis and larynx normal. No airway compromise. No
evidence of subglottic stenosis. The tip of the tracheostomy tube is
not included on the study.

Salivary glands: Negative

Thyroid: Postop thyroidectomy for cancer. Stable soft tissue density
in the thyroid bed bilaterally without defined mass lesion. This may
be due to postoperative scar tissue in the thyroid bed.

Lymph nodes: Negative for enlarged lymph nodes in the neck.

Vascular: Normal vascular enhancement.

Limited intracranial: Negative

Visualized orbits: Negative

Mastoids and visualized paranasal sinuses: Mild mucosal edema right
maxillary sinus. Remaining sinuses clear.

Skeleton: No acute skeletal abnormality.

Upper chest: Moderately large right pleural effusion. 6 mm
spiculated nodule left upper lobe unchanged from the CT chest
01/04/2020

Other: None
IMPRESSION: 1. Negative for subglottic stenosis. Tracheostomy in satisfactory
position. The tip of the tracheostomy is not included on the study.
2. Interval improvement in retropharyngeal fullness on the right.
3. Postop thyroidectomy. Stable soft tissue in the thyroid bed
bilaterally, stable from prior study.
4. Moderate large right pleural effusion
5. Stable 6 mm left upper lobe nodule.

## 2022-12-18 IMAGING — DX DG ABDOMEN 1V
1 series · 1 of 1 positions shown · non-contrast
Comparison: October 09, 2020

CLINICAL DATA: Nasogastric tube placement.

EXAM:
ABDOMEN - 1 VIEW

[abdomen]
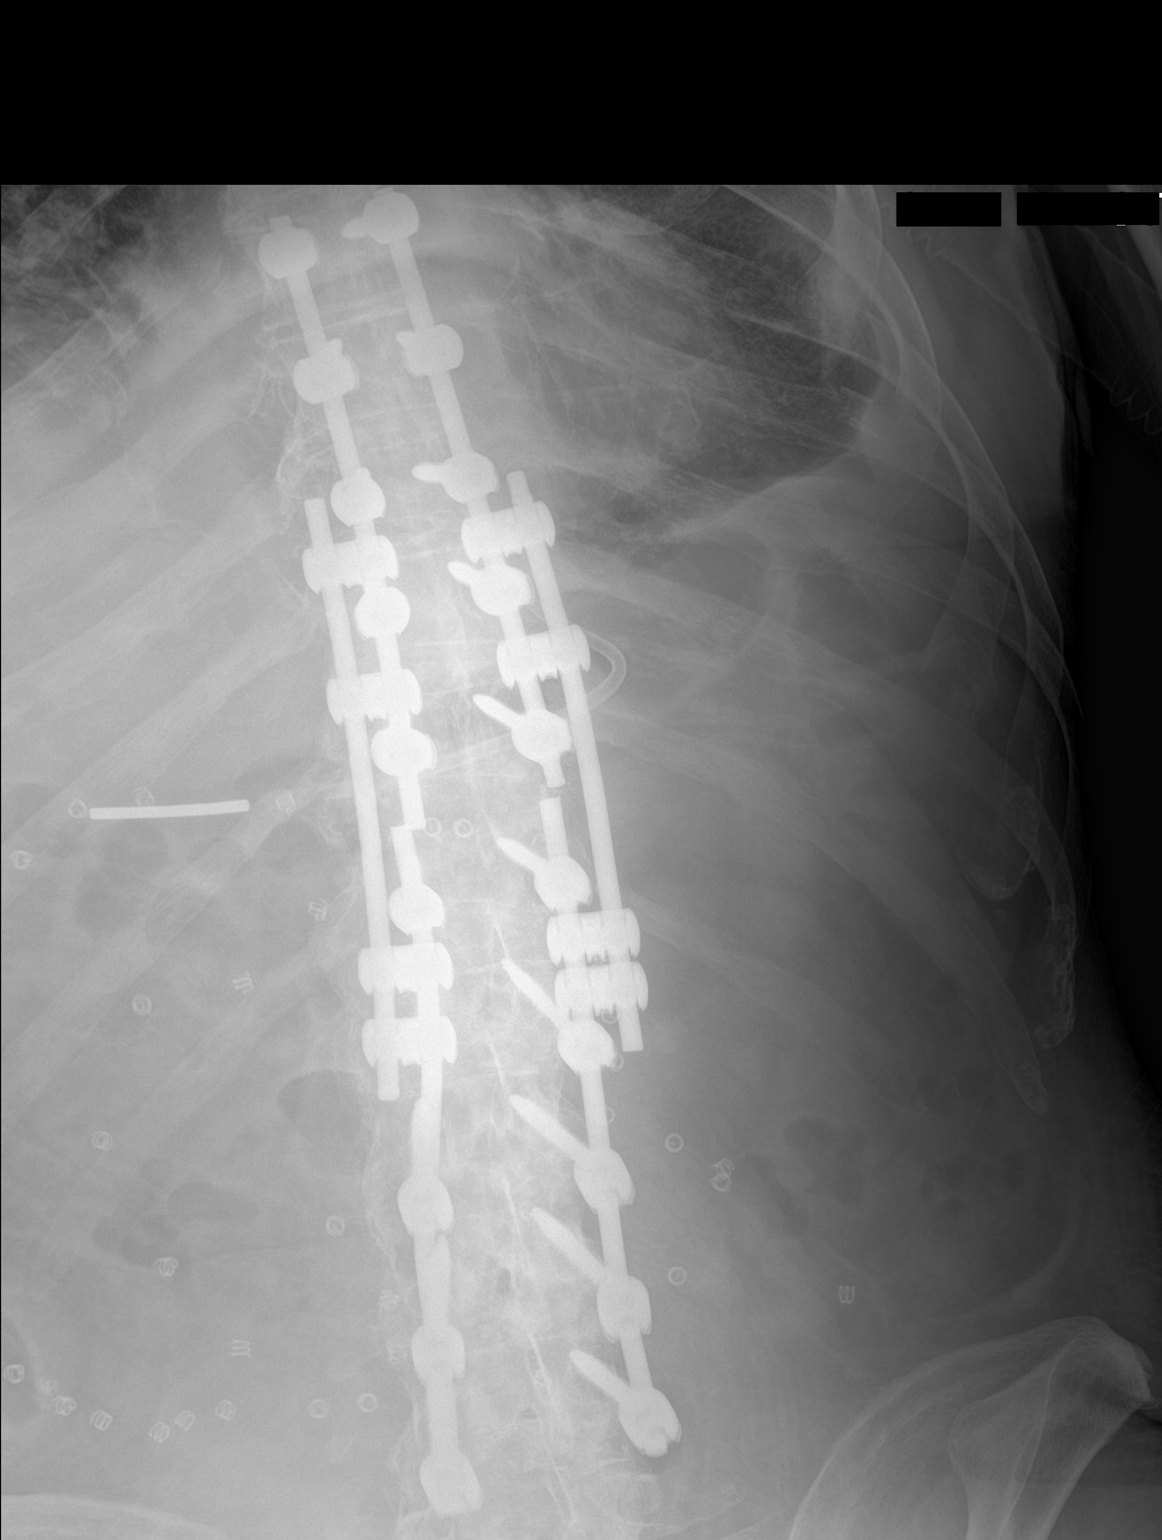

[1 of 1 positions shown; findings below may reference images not displayed]

FINDINGS: A Dobbhoff feeding tube is seen with its weighted tip overlying the
expected region of the gastric antrum/duodenal bulb. The bowel gas
pattern is normal. Numerous bilateral radiopaque pedicle screws are
seen throughout the thoracolumbar spine. Multiple radiopaque
surgical coils are seen scattered throughout the abdomen. No
radio-opaque calculi or other significant radiographic abnormality
are seen.
IMPRESSION: Dobbhoff feeding tube positioning, as described above.

## 2022-12-25 IMAGING — CR DG CHEST 2V
1 series · 1 of 1 positions shown · non-contrast
Comparison: Radiographs 10/10/2020 and 10/07/2020.  CT 09/10/2020.

CLINICAL DATA: Hypoxia.  History of hypertension and diabetes.

EXAM:
CHEST - 2 VIEW

[chest ap]
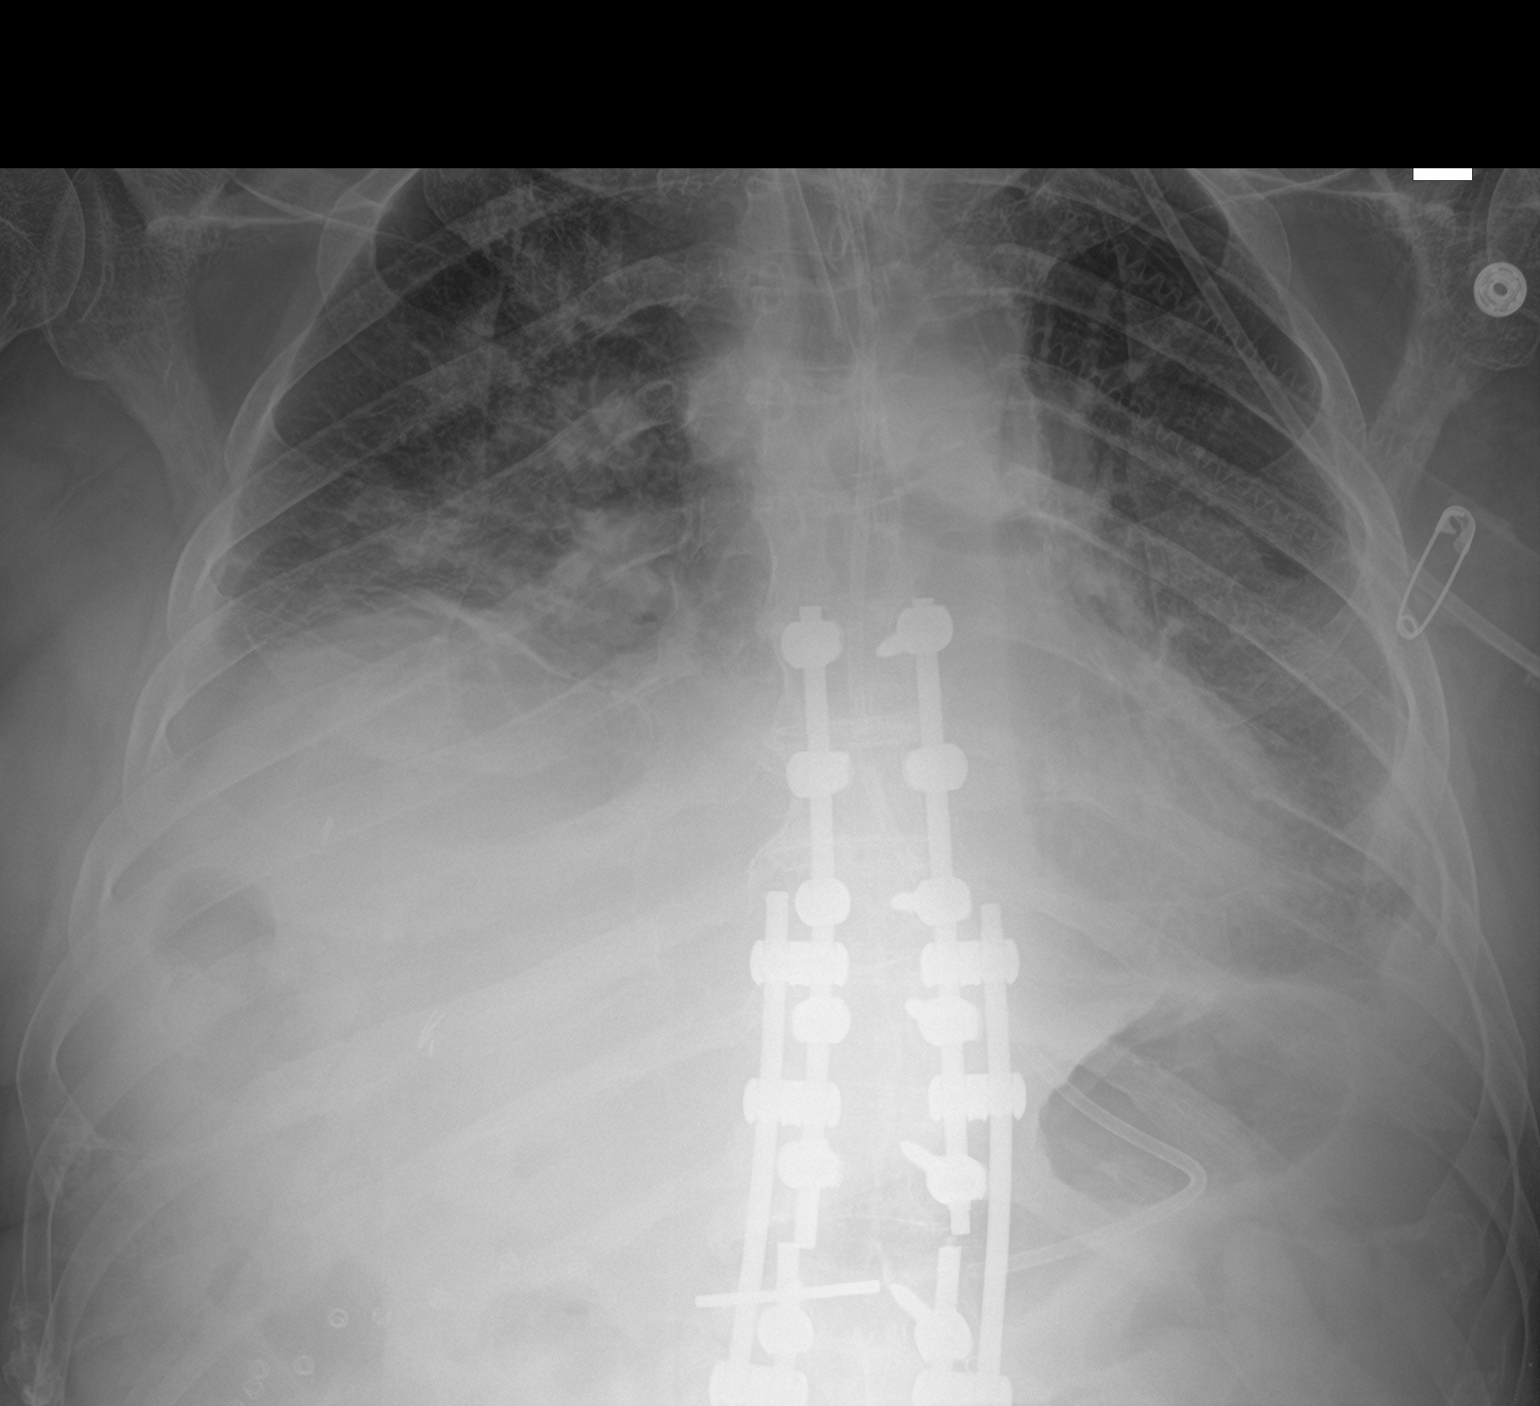

[1 of 1 positions shown; findings below may reference images not displayed]

FINDINGS: Tracheostomy and feeding tube are in place. There are persistent low
lung volumes with moderate bilateral pleural effusions and
associated bibasilar atelectasis. There may be mild edema. No
pneumothorax. The heart size and mediastinal contours are stable.
Patient is status post thoracolumbar fusion with chronic fracture of
the interconnecting rods bilaterally.
IMPRESSION: Stable chest with persistent bilateral pleural effusions, bibasilar
atelectasis and possible mild edema.

## 2022-12-27 IMAGING — CT CT ABDOMEN W/O CM
2 of 8 series · 14 of 46 positions shown, 16 images · non-contrast
Comparison: 09/10/2020

CLINICAL DATA: Assess anatomy for G-tube placement

EXAM:
CT ABDOMEN WITHOUT CONTRAST
TECHNIQUE: Multidetector CT imaging of the abdomen was performed following the
standard protocol without IV contrast.

[Series 3: ap without · axial · non-contrast · 0.87mm/px · z∈[-566,-296]mm · 11 of 66 slices shown, 13 images]
[im 6/66  soft-tissue]
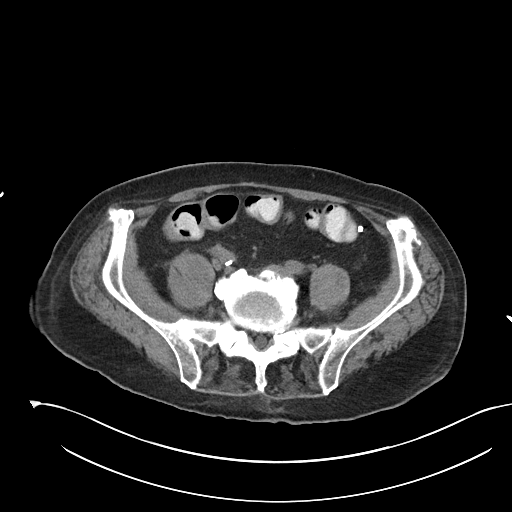
[im 6/66  bone]
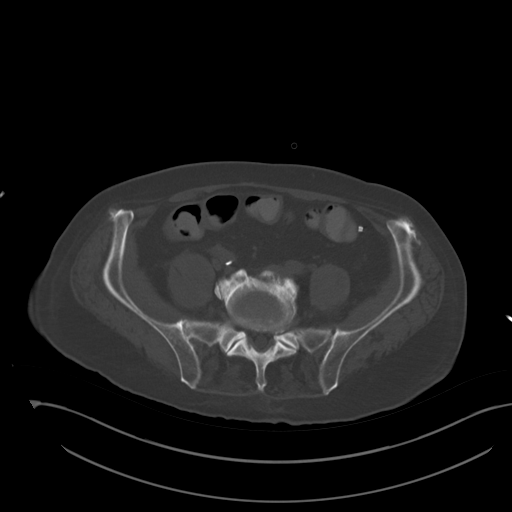
[im 11/66  soft-tissue]
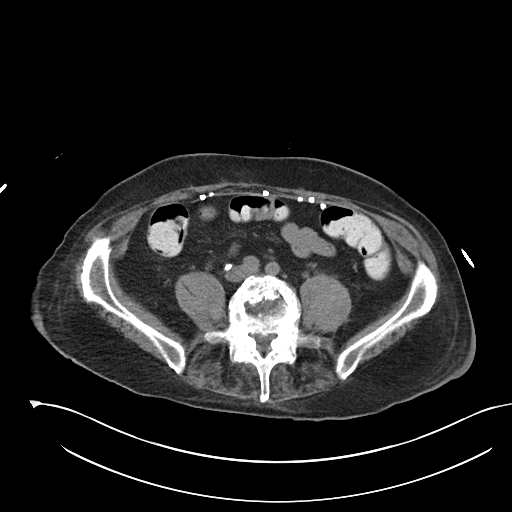
[im 17/66  soft-tissue]
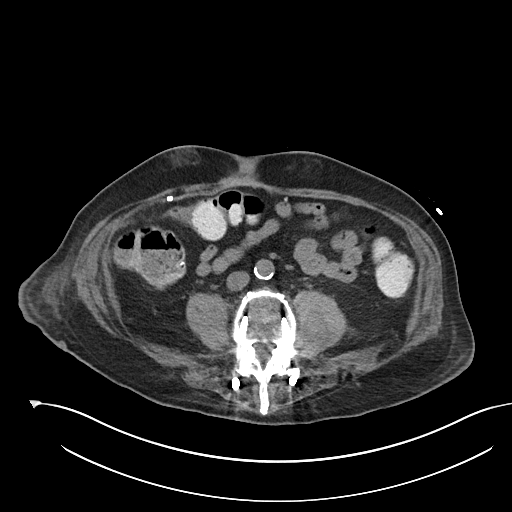
[im 22/66  soft-tissue]
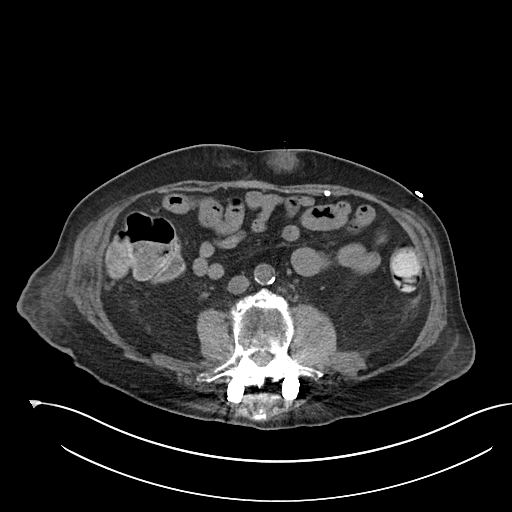
[im 28/66  soft-tissue]
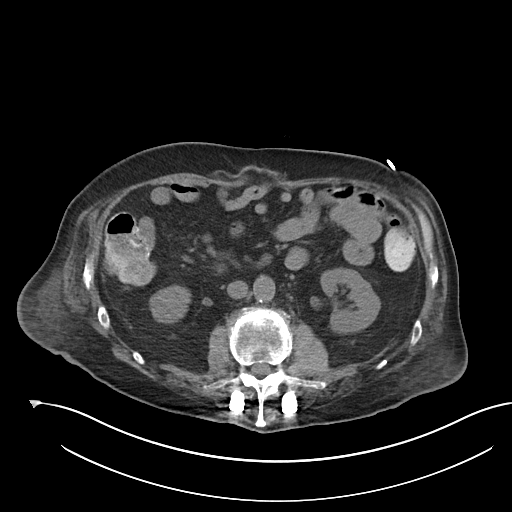
[im 33/66  soft-tissue]
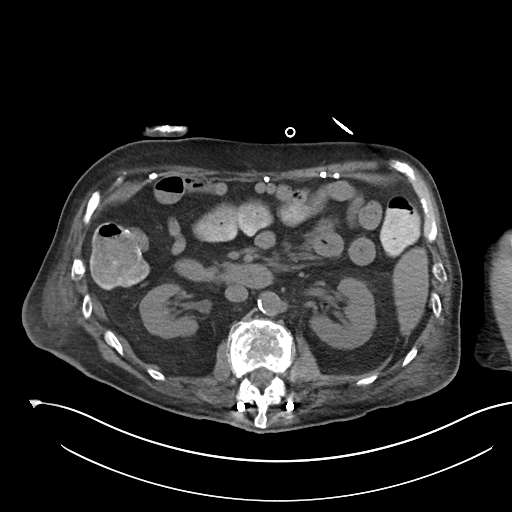
[im 38/66  soft-tissue]
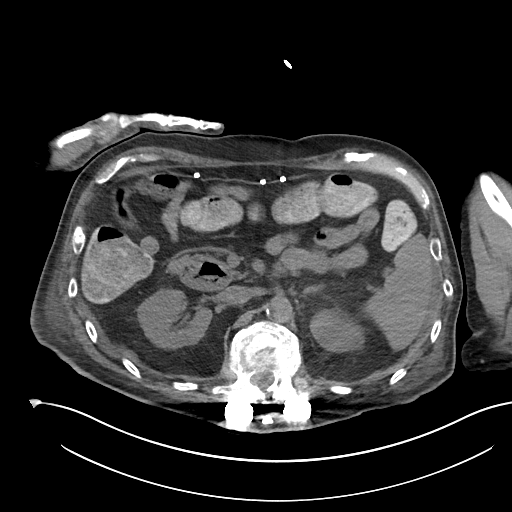
[im 44/66  soft-tissue]
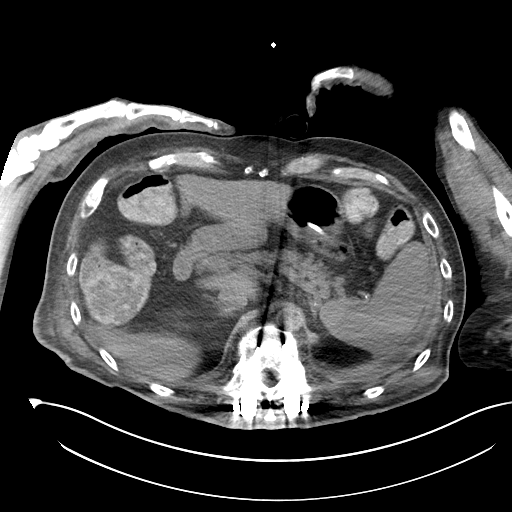
[im 49/66  soft-tissue]
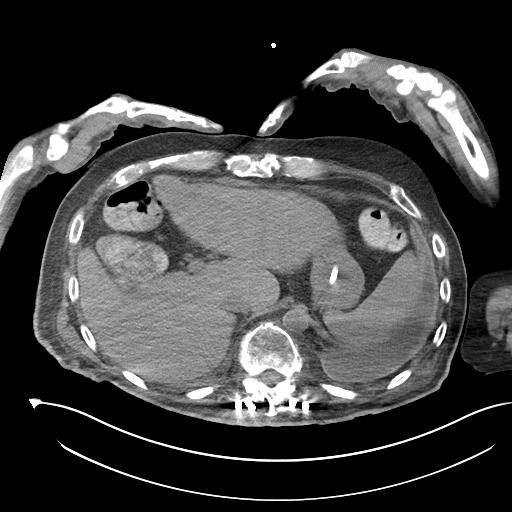
[im 49/66  bone]
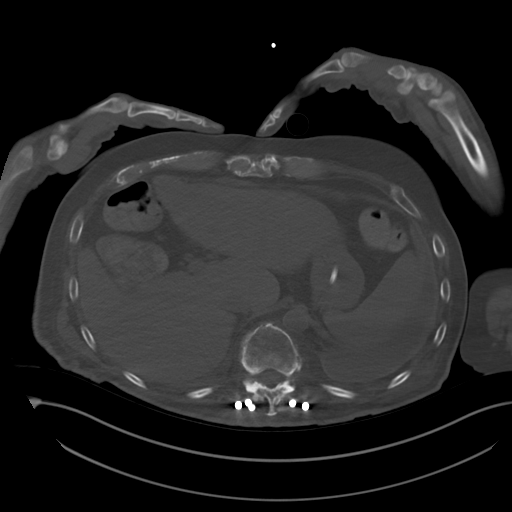
[im 55/66  soft-tissue]
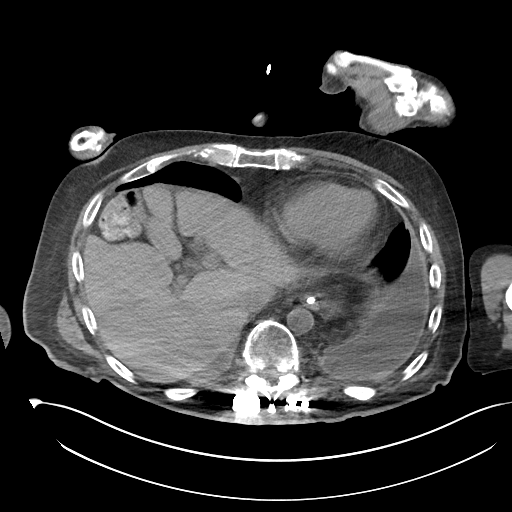
[im 60/66  soft-tissue]
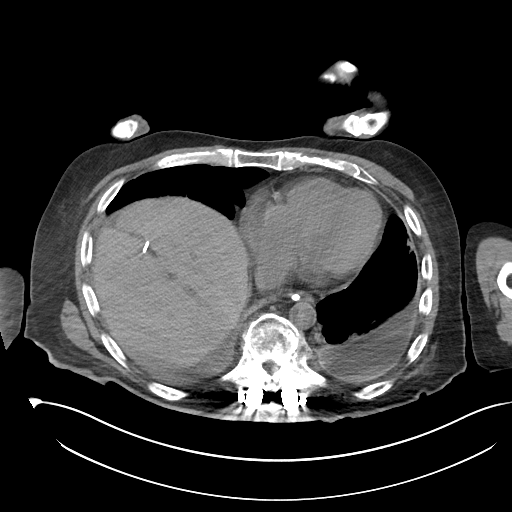

[Series 6: cor · coronal · 0.64mm/px · 3 of 109 slices shown]
[im 22/109  soft-tissue]
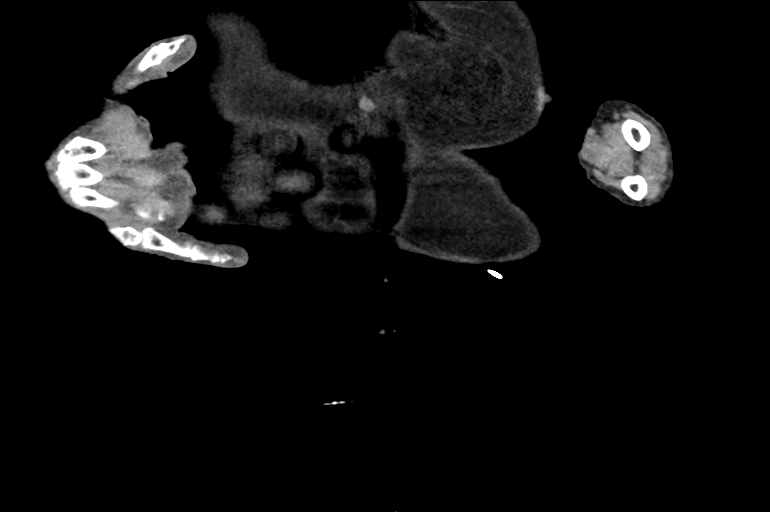
[im 44/109  soft-tissue]
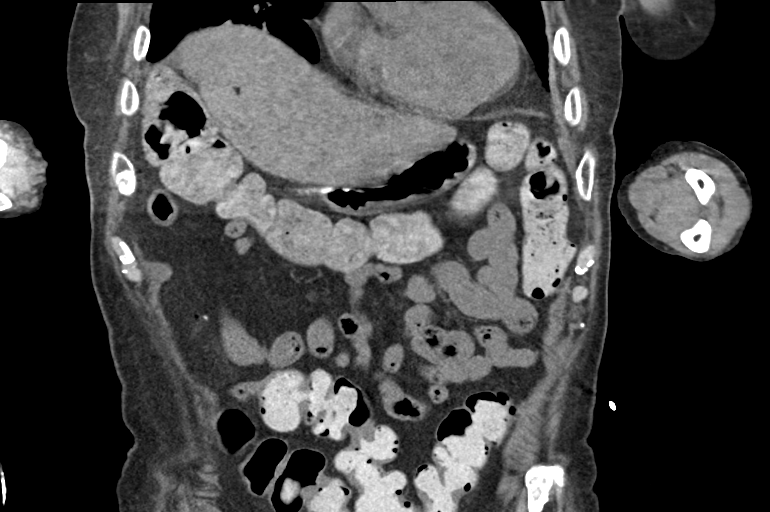
[im 65/109  soft-tissue]
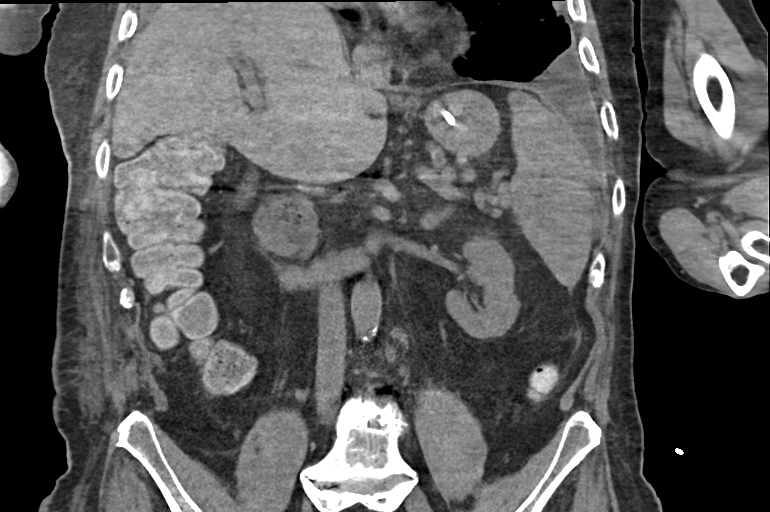

[14 of 46 positions shown; findings below may reference images not displayed]

FINDINGS: Lower chest: Small bilateral pleural effusions. Linear opacities at
the lung bases likely due to atelectasis. There is airspace opacity
in the right lower lobe suspicious for pneumonia.

Hepatobiliary: Calcification located in the subcapsular region of
the posterior right hepatic lobe may be due to prior granulomatous
inflammation. The liver otherwise unremarkable. The gallbladder is
absent.

Pancreas: Unremarkable. No pancreatic ductal dilatation or
surrounding inflammatory changes.

Spleen: Normal in size without focal abnormality.

Adrenals/Urinary Tract: Single punctate bilateral nonobstructing
renal calculi. Kidneys and visualized segments of the ureters
otherwise unremarkable. No significant abnormality of the adrenal
glands.

Stomach/Bowel: NG tube terminates in the distal gastric lumen.
Multiple duodenal diverticula are noted. No dilated bowel loops
noted within the visualized abdomen.

Vascular/Lymphatic: No enlarged abdominal lymph nodes.
Atherosclerotic plaque seen scattered throughout the abdominal
aorta.

Other: Anterior abdominal wall surgical mesh material is noted.
Areas of subcutaneous fat stranding in the anterior abdominal wall
subcutaneous fat likely sites of medication administration.

Musculoskeletal: Diffuse osteopenia extensive posterior fusion
changes of the thoracolumbar spine. Fracture noted through the
inferior endplate of T11 vertebral body.
IMPRESSION: 1. Surgical mesh material interposed between the stomach and
anterior abdominal wall.
2. Right lower lobe airspace opacities suspicious for pneumonia.

## 2023-01-03 IMAGING — DX DG CHEST 1V PORT
1 series · 1 of 1 positions shown · non-contrast
Comparison: Portable exam 6848 hours compared to 10/22/2020

CLINICAL DATA: Trach ostomy tube change, central line insertion

EXAM:
PORTABLE CHEST 1 VIEW

[chest]
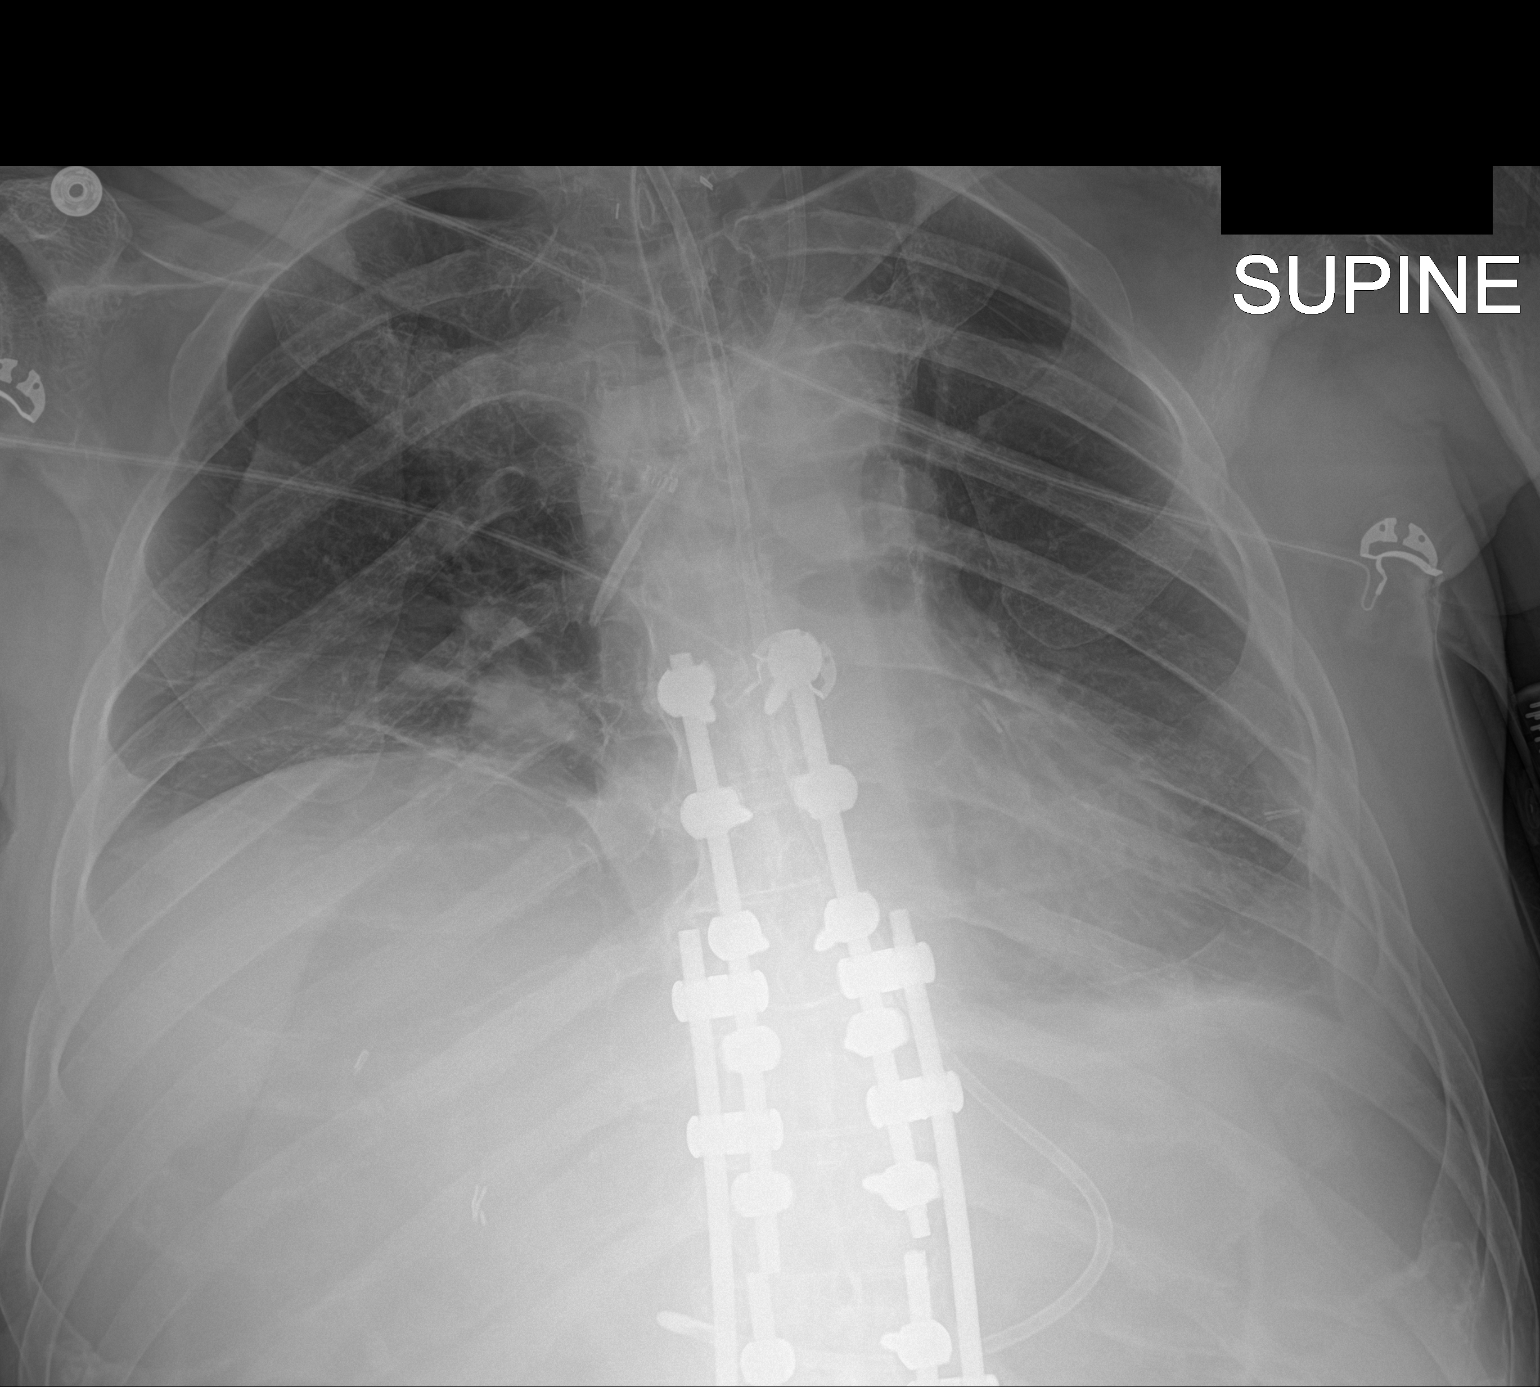

[1 of 1 positions shown; findings below may reference images not displayed]

FINDINGS: New tracheostomy tube with tip projecting 13 mm above carina.

LEFT jugular central venous catheter with tip projecting over SVC.

Tip of feeding tube projects over distal gastric antrum.

Normal heart size and mediastinal contours.

Elevation of RIGHT diaphragm with bibasilar atelectasis and minimal
LEFT pleural effusion.

No definite pneumothorax.

Bones demineralized with prior thoracolumbar spinal fixation.
IMPRESSION: Line and tube positions as above.

Bibasilar atelectasis and minimal LEFT pleural effusion.

## 2023-01-04 IMAGING — DX DG CHEST 1V PORT
1 series · 1 of 1 positions shown · non-contrast
Comparison: 10/31/2020

CLINICAL DATA: Respiratory failure

EXAM:
PORTABLE CHEST 1 VIEW

[chest ap]
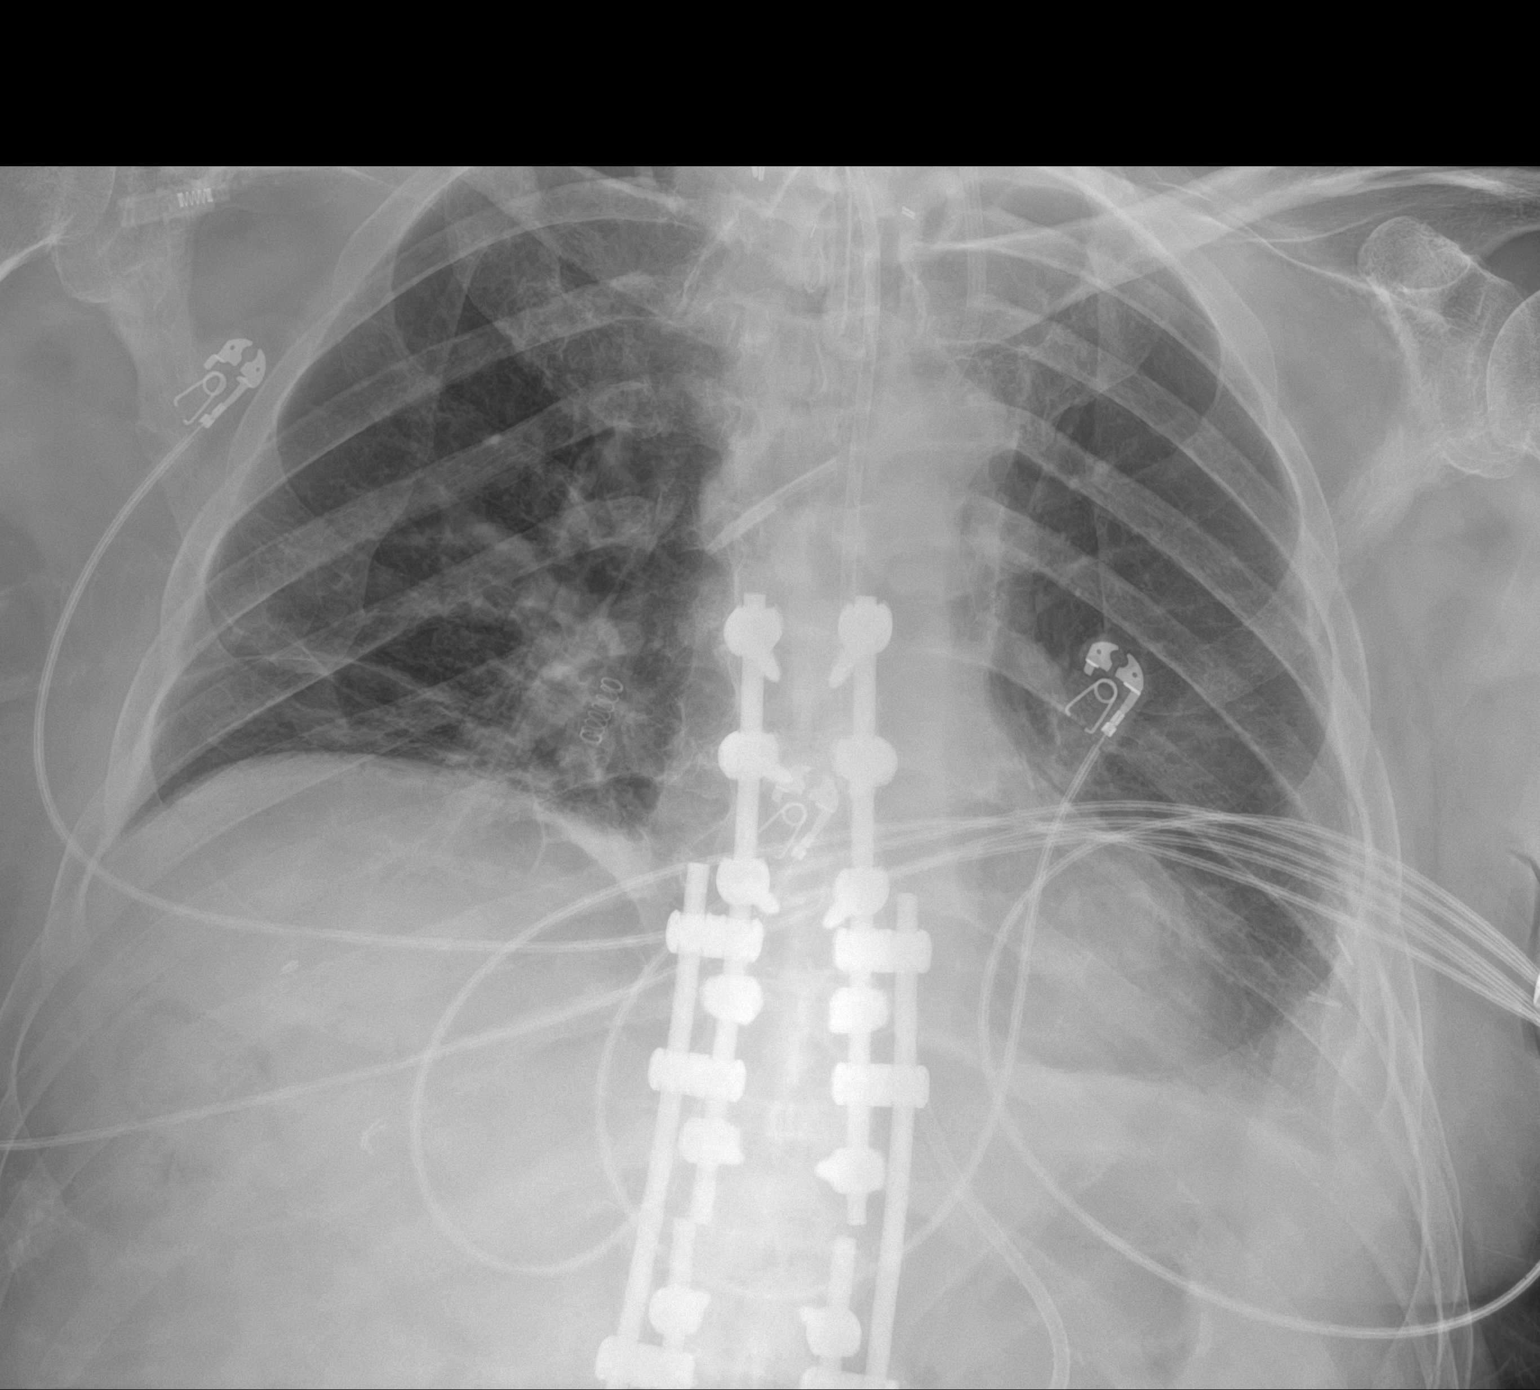

[1 of 1 positions shown; findings below may reference images not displayed]

FINDINGS: Tracheostomy, left internal jugular temporary hemodialysis catheter
with its tip within the superior vena cava, nasoenteric feeding tube
extending into the upper abdomen, are all unchanged. Pulmonary
insufflation is stable. Mild right basilar atelectasis. Small
laterally loculated left pleural effusion again noted. No
pneumothorax. Cardiac size within normal limits. Pulmonary
vascularity is normal. No acute bone abnormality. Thoracolumbar
fusion hardware again noted
IMPRESSION: Stable support lines and tubes.

Stable pulmonary insufflation with right basilar atelectasis.

Stable small laterally loculated left pleural effusion

## 2023-01-09 IMAGING — DX DG CHEST 1V PORT
1 series · 1 of 1 positions shown · non-contrast
Comparison: 11/01/2020 and older studies.

CLINICAL DATA: Cough.  Patient with a tracheostomy.

EXAM:
PORTABLE CHEST 1 VIEW

[chest]
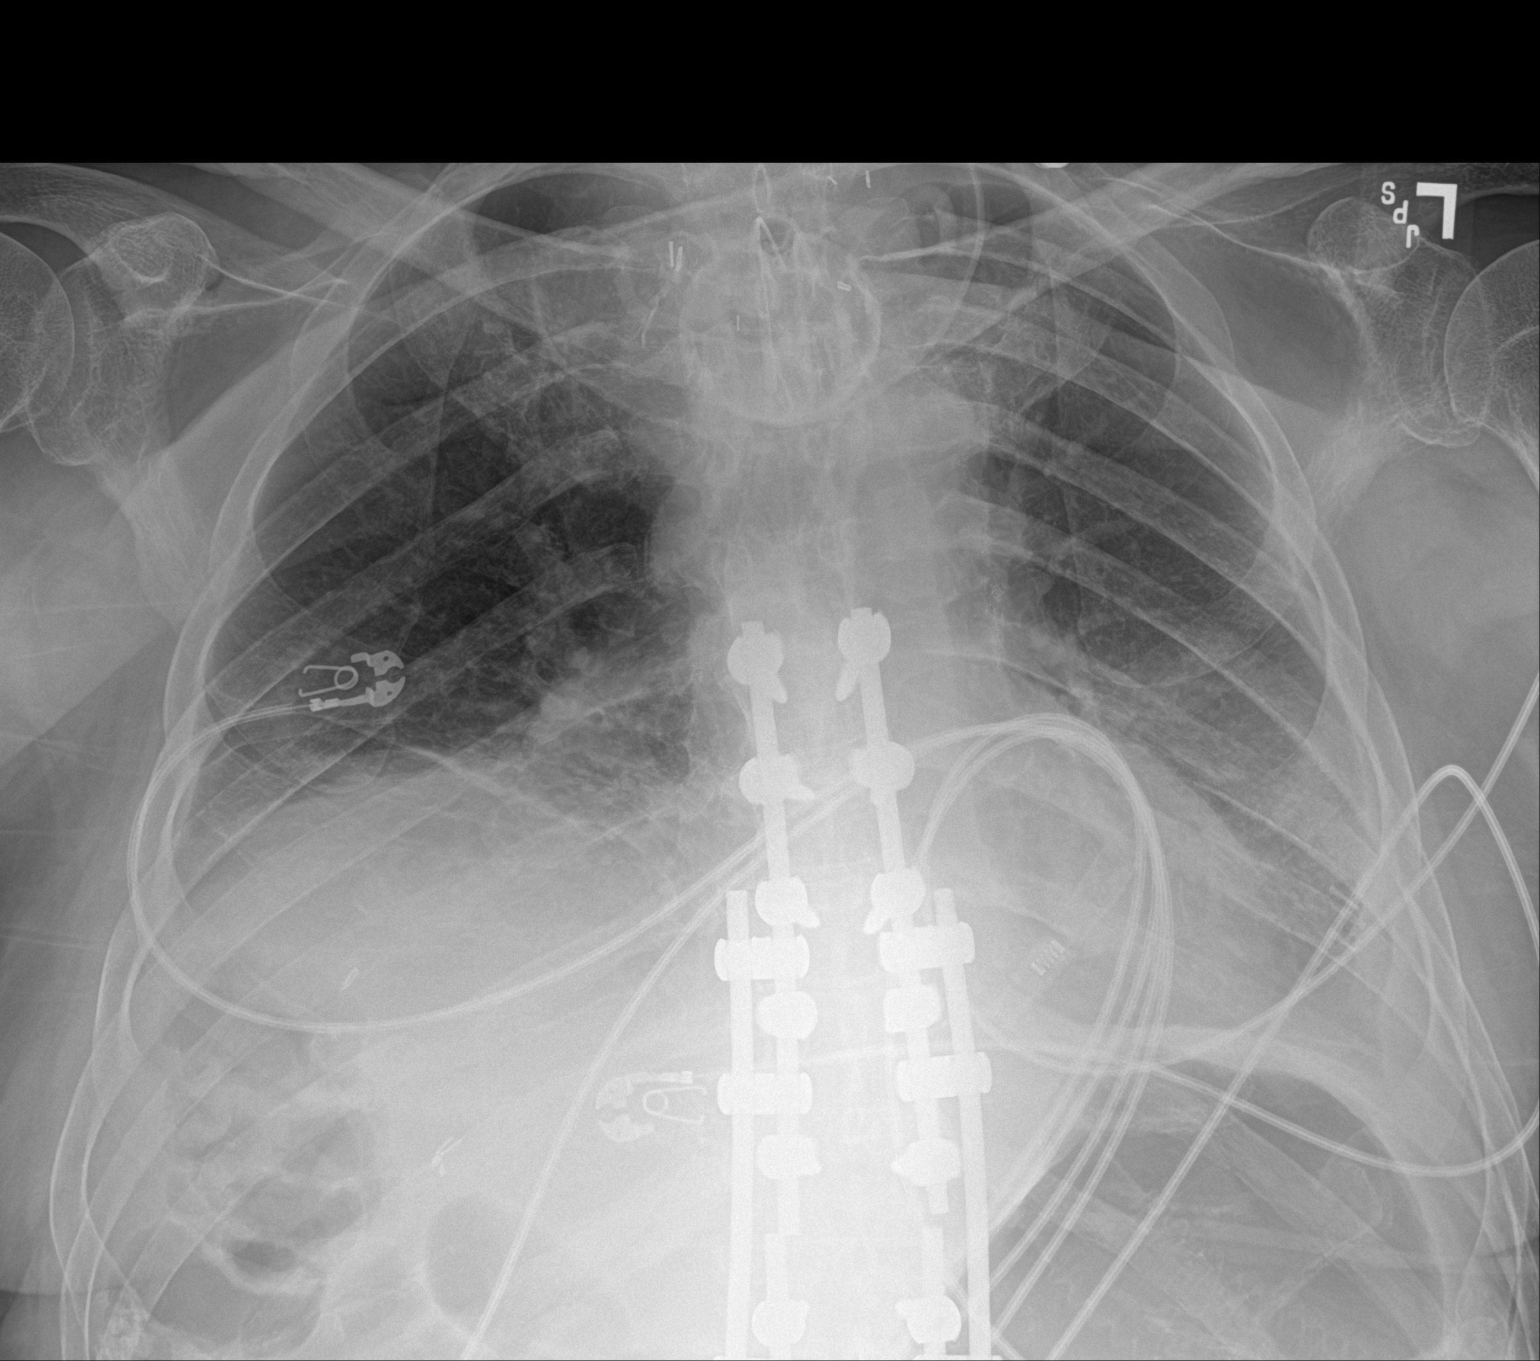

[1 of 1 positions shown; findings below may reference images not displayed]

FINDINGS: Hazy and linear opacities at both lung bases partly obscuring
hemidiaphragms. This has increased compared to the prior exam.
Findings consistent with a combination of small effusions with
atelectasis or possibly infection, atelectasis favored. Mid to upper
lungs are clear.

No pneumothorax.

Cardiac silhouette normal in size. No mediastinal or hilar masses.
Stable tracheostomy.
IMPRESSION: 1. Increase in lung base opacity since the prior exam consistent
with a combination of pleural effusions and atelectasis, less likely
infection. No evidence pulmonary edema.

## 2023-01-11 IMAGING — DX DG CHEST 1V PORT
1 series · 1 of 1 positions shown · non-contrast
Comparison: 11/06/2020.

CLINICAL DATA: History of lung infiltrate.

EXAM:
PORTABLE CHEST 1 VIEW

[chest ap]
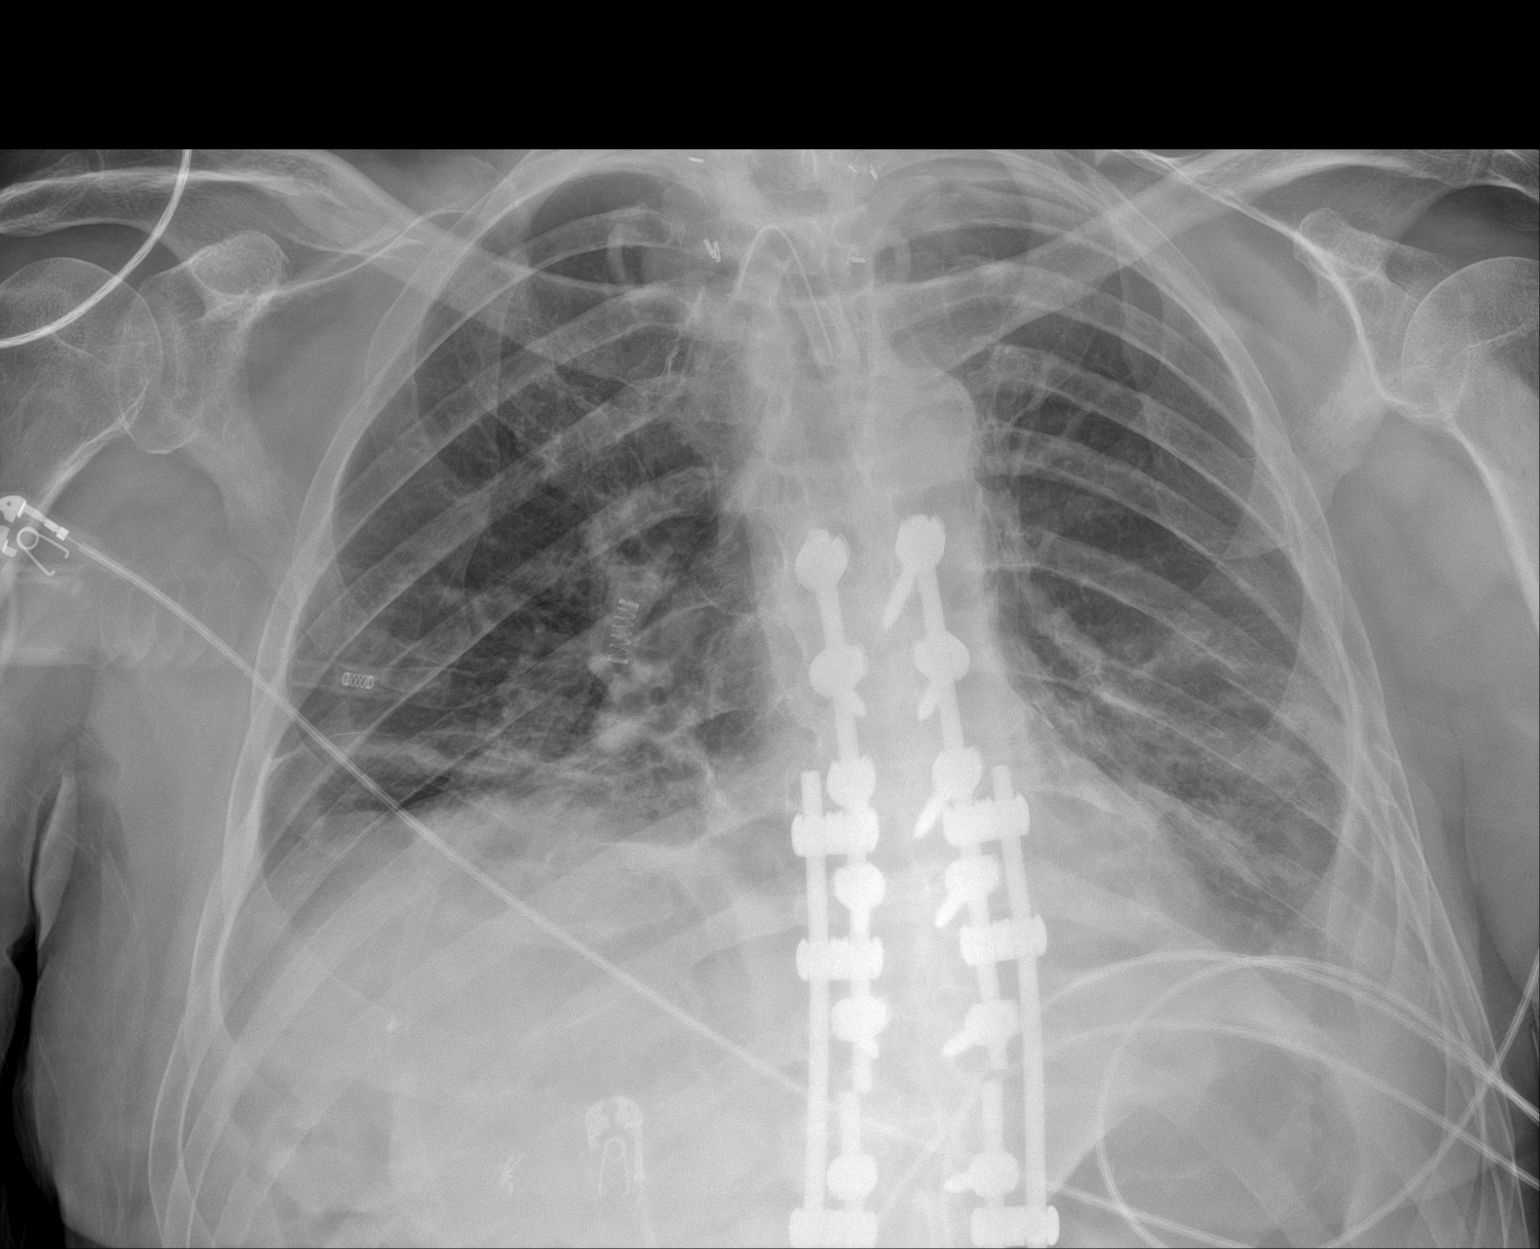

[1 of 1 positions shown; findings below may reference images not displayed]

FINDINGS: Tracheostomy tube in stable position. Heart size normal. Persistent
bibasilar atelectasis/infiltrates. Persistent small left pleural
effusion. No definite right pleural effusion. No pneumothorax. Prior
thoracolumbar spine fusion. Surgical clips noted over the upper
chest and right upper quadrant.
IMPRESSION: 1. Persistent bibasilar atelectasis/infiltrates. Small left pleural
effusion. Exam unchanged from prior exam.

2.  Tracheostomy tube in stable position.

## 2023-01-14 IMAGING — DX DG CHEST 1V PORT
1 series · 1 of 1 positions shown · non-contrast
Comparison: November 08, 2020

CLINICAL DATA: Respiratory failure

EXAM:
PORTABLE CHEST 1 VIEW

[chest]
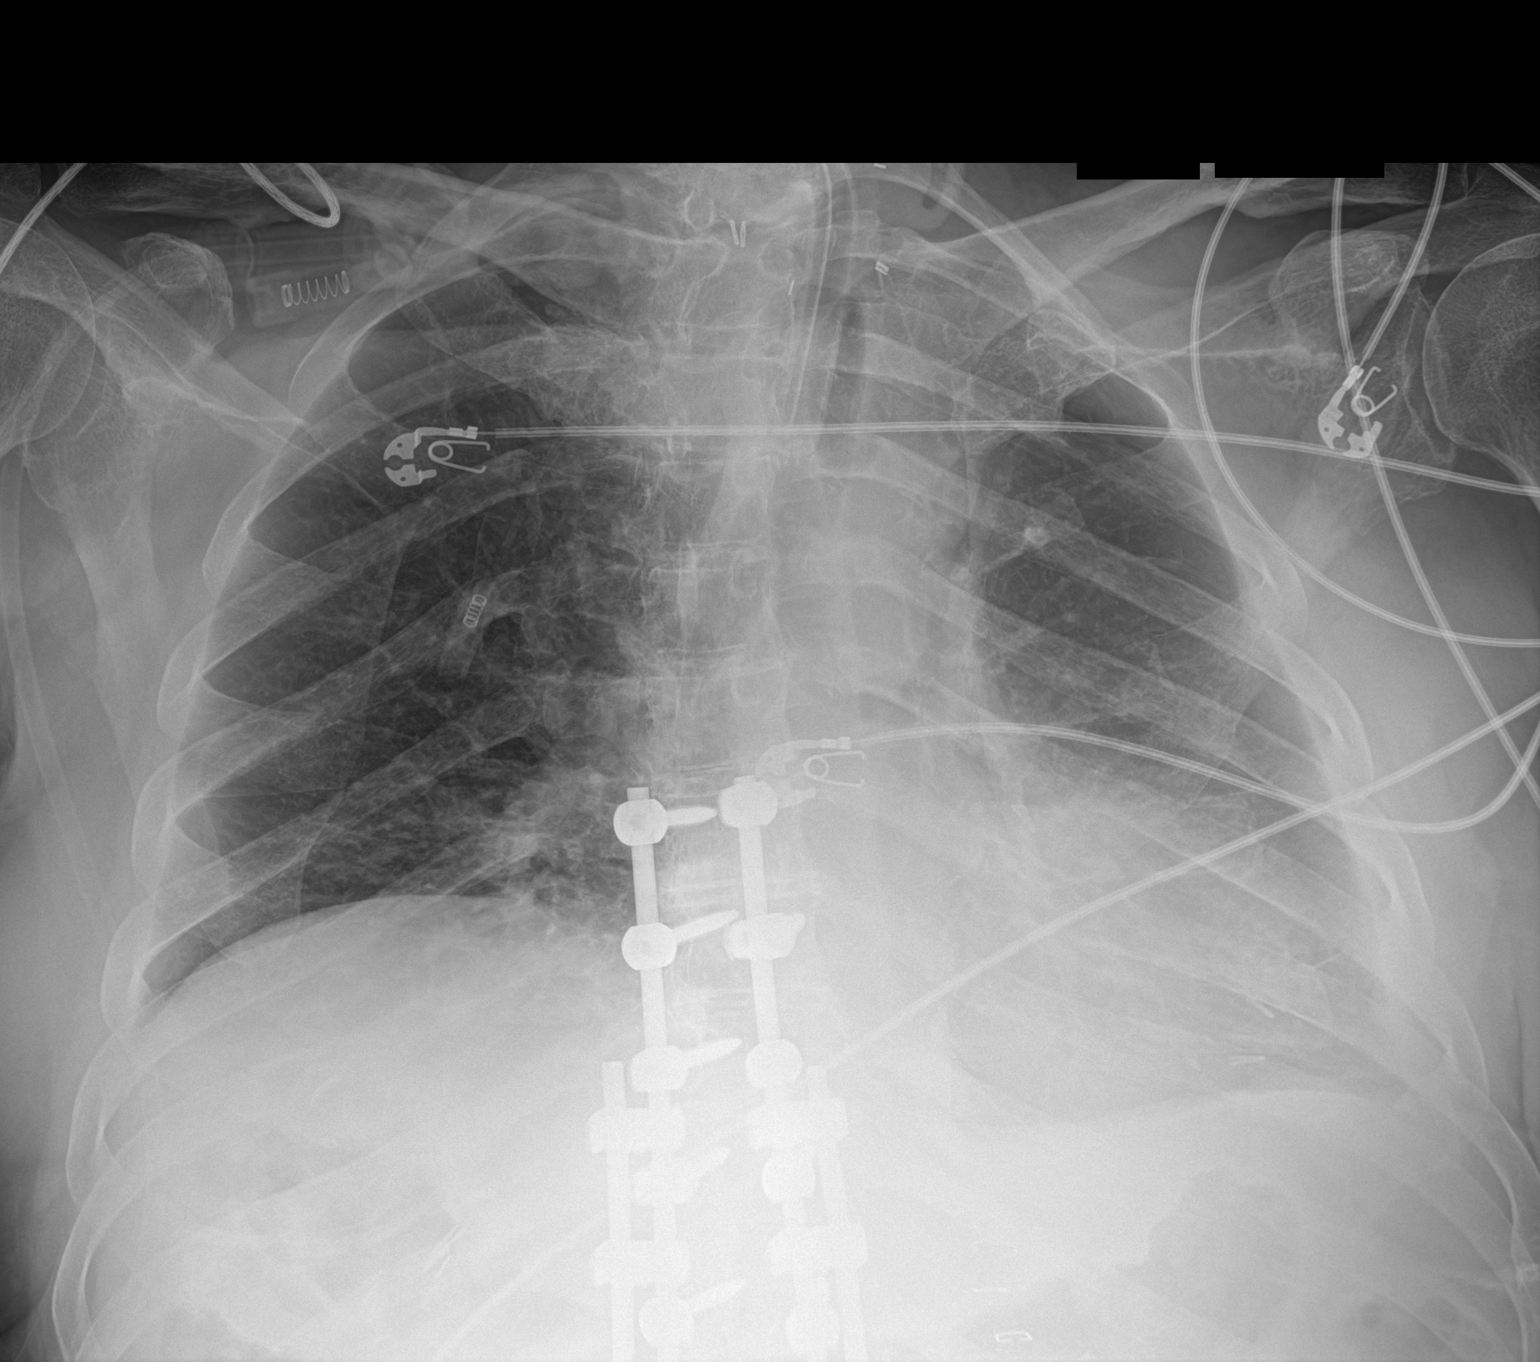

[1 of 1 positions shown; findings below may reference images not displayed]

FINDINGS: Tracheostomy catheter tip is 4.3 cm above the carina. No
pneumothorax. Small left pleural effusion noted. No edema or
airspace opacity. Heart is upper normal in size with pulmonary
vascularity normal. No adenopathy. Postoperative changes in the
thoracic and visualized upper lumbar regions noted.
IMPRESSION: Tracheostomy as described without pneumothorax. Small left pleural
effusion. No edema or airspace opacity. Heart upper normal in size.

## 2023-01-23 IMAGING — DX DG ABDOMEN 1V
1 series · 1 of 1 positions shown · non-contrast
Comparison: 10/25/2020

CLINICAL DATA: Peg tube placement

EXAM:
ABDOMEN - 1 VIEW

[abdomen]
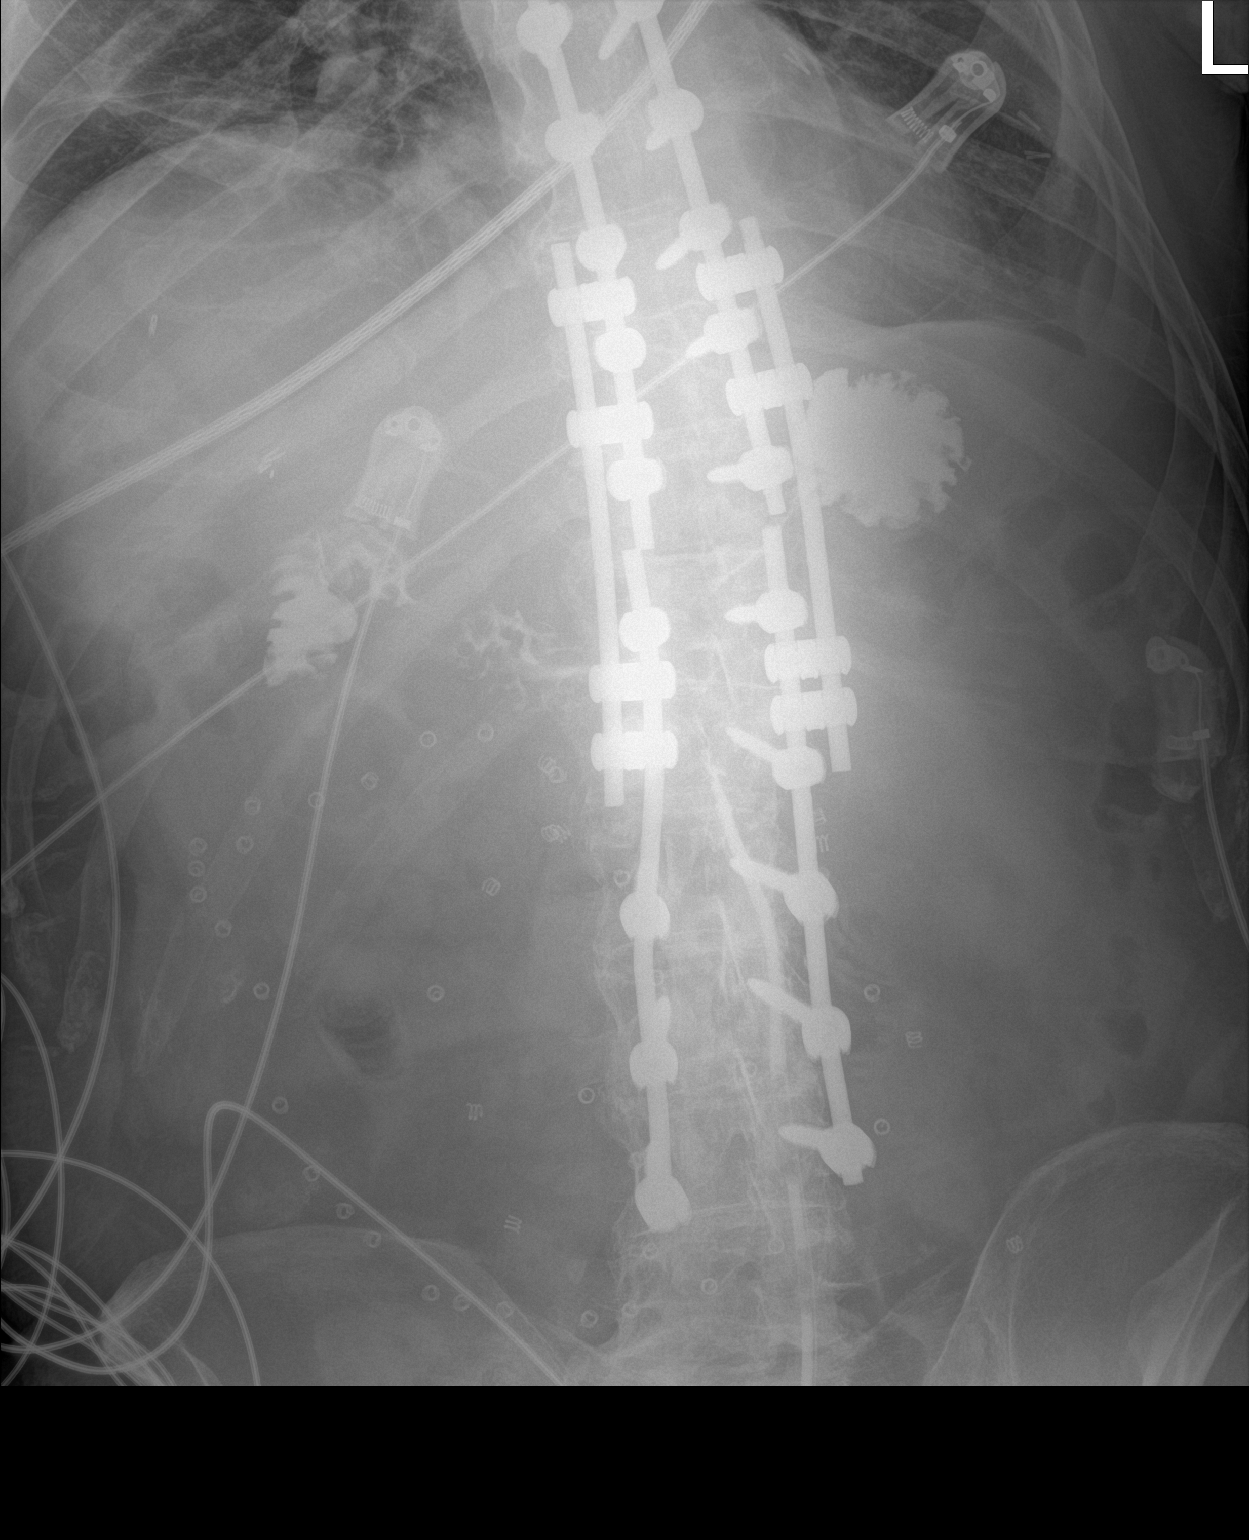

[1 of 1 positions shown; findings below may reference images not displayed]

FINDINGS: Catheter tubing overlies the mid abdomen. There is enteric contrast
within the stomach and proximal duodenum. No extraluminal contrast
collections. No gross free intraperitoneal air. Nonobstructive bowel
gas pattern. Extensive thoracolumbar fixation hardware with
chronically fractured fixation rods.
IMPRESSION: Enteric contrast within the stomach and proximal duodenum. No
extraluminal contrast collections.

## 2023-01-25 IMAGING — DX DG ABD PORTABLE 1V
1 series · 1 of 1 positions shown · non-contrast
Comparison: 11/20/2020

CLINICAL DATA: Encounter for leaking PEG tube.

EXAM:
PORTABLE ABDOMEN - 1 VIEW

[abdomen]
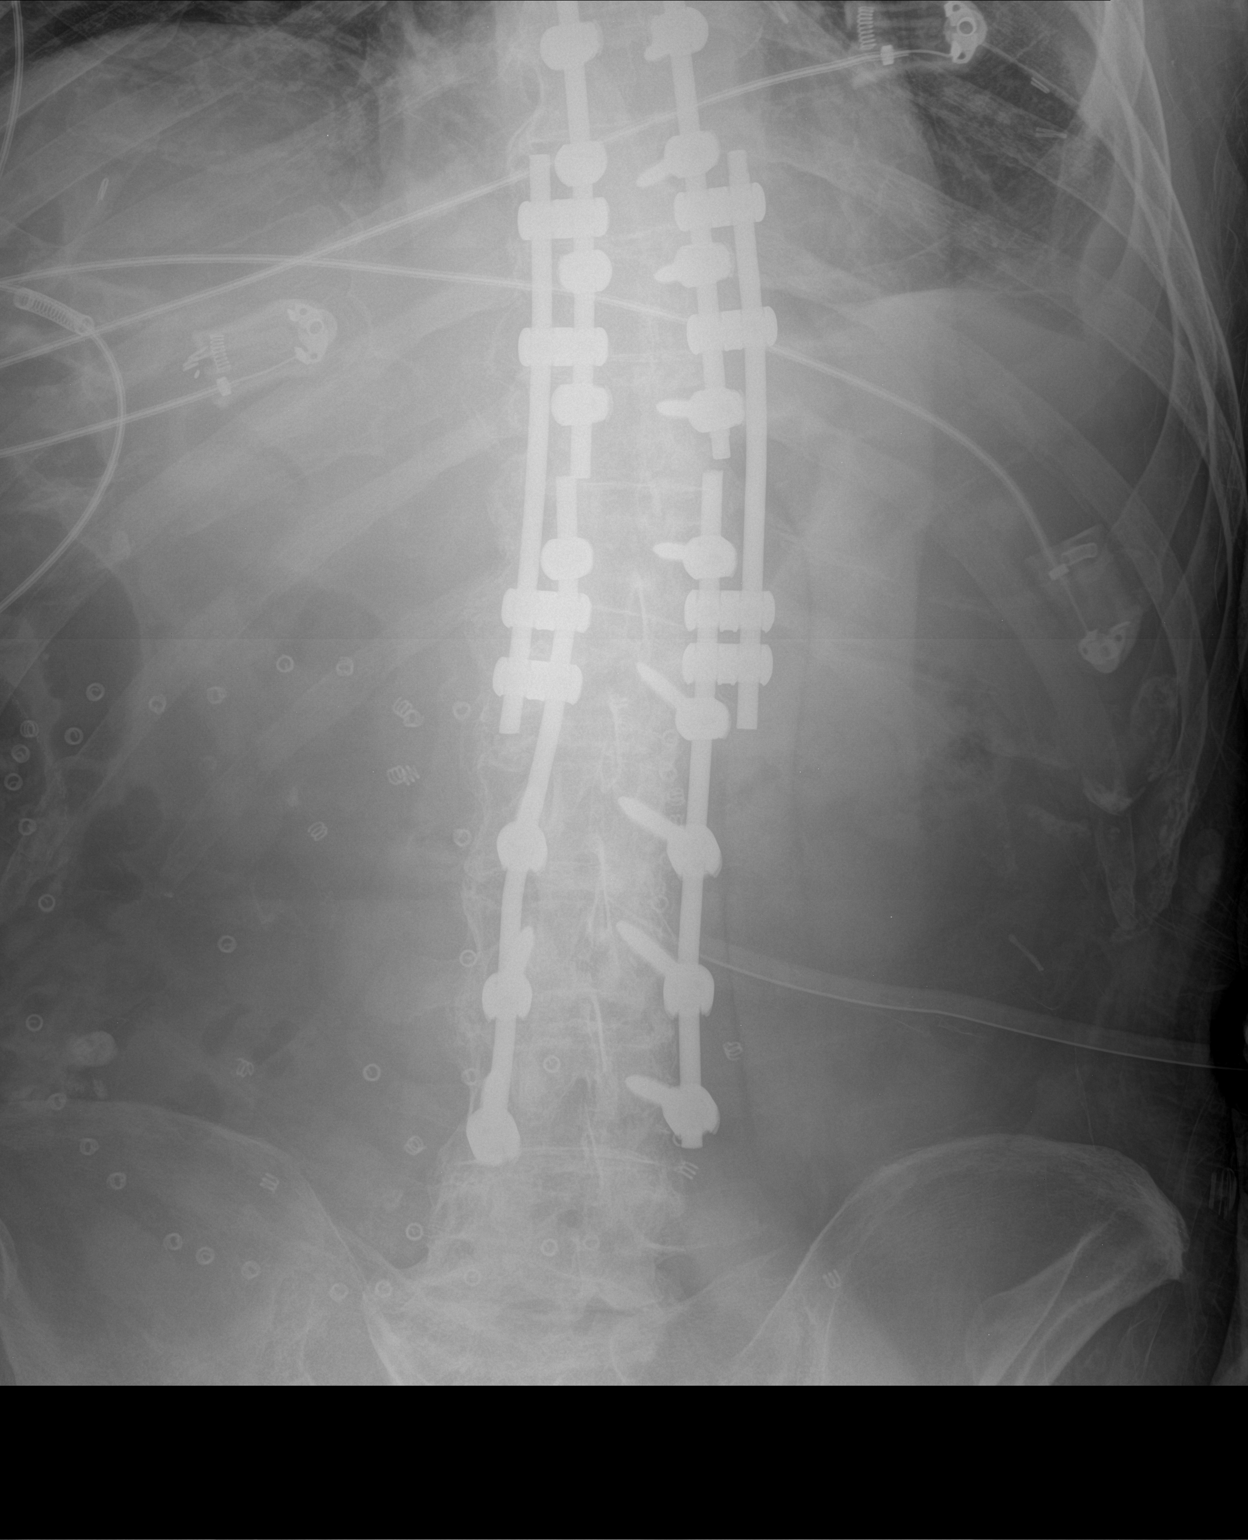

[1 of 1 positions shown; findings below may reference images not displayed]

FINDINGS: Gastrostomy tubing is identified overlying the left lower quadrant
of the abdomen. The tip of the gastrostomy tube is not confidently
identified. Cannot confirm intraluminal placement based on current
radiograph. No dilated bowel loops. Hernia mesh overlies the right
lower quadrant of the abdomen.
IMPRESSION: Gastrostomy tubing overlies the left lower quadrant of the abdomen.
Cannot confirm intraluminal placement based on current radiograph.
Injection water-soluble contrast into the gastrostomy tube is
recommended if intraluminal placement confirmation is desired.

## 2023-01-26 IMAGING — XA IR REPLACE G-TUBE/COLONIC TUBE
1 series · 6 of 6 positions shown · non-contrast
Comparison: none

INDICATION: 79-year-old gentleman with a dislodged gastrostomy tube presents to
interventional Radiology for placement.

[Series 1: fl (-) angio · 2 acquisitions, 6 frames shown]
[im 1/2]
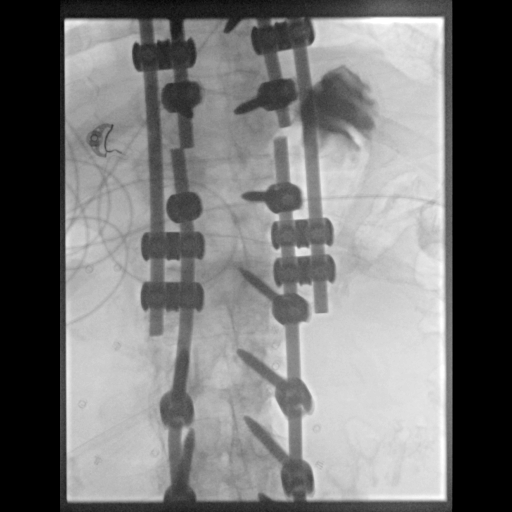
[im 1/2]
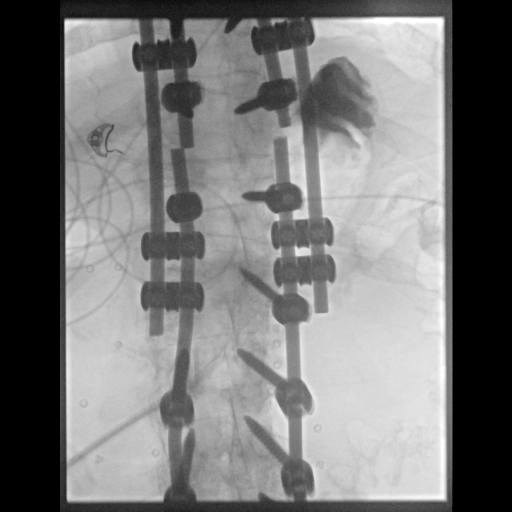
[im 1/2]
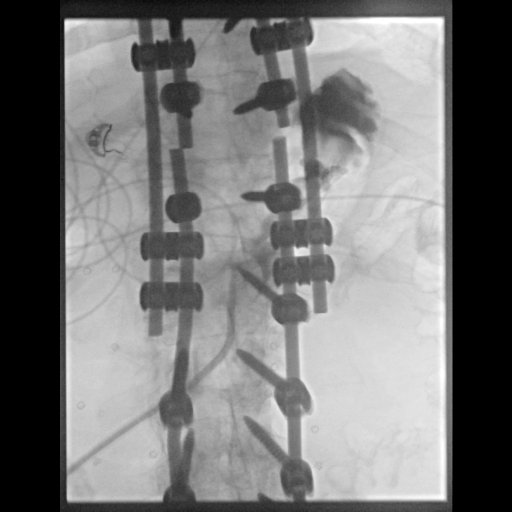
[im 2/2]
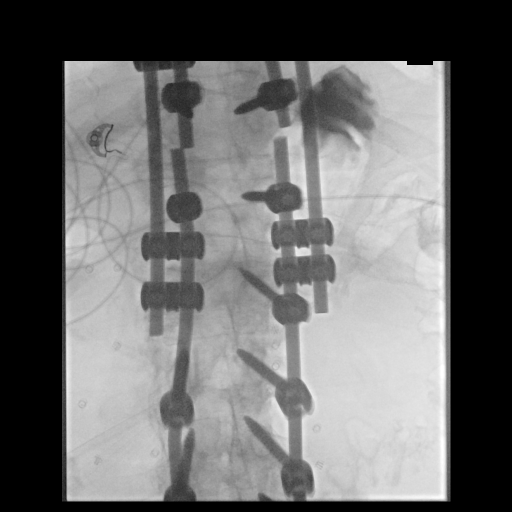
[im 2/2]
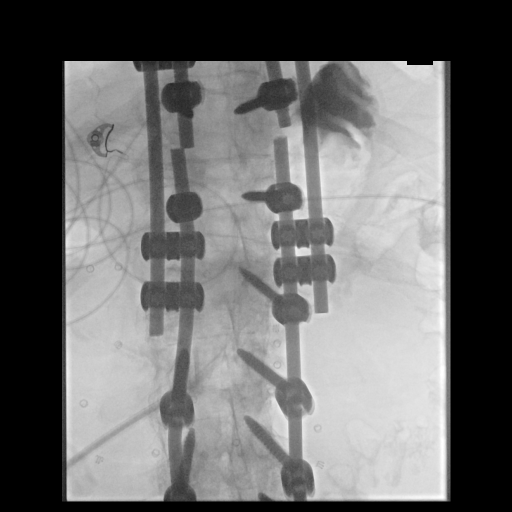
[im 2/2]
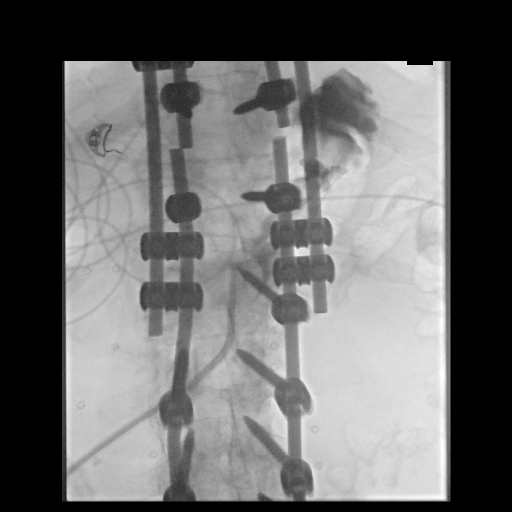

[6 of 6 positions shown; findings below may reference images not displayed]

EXAM:
Fluoroscopy guided G-tube placement

MEDICATIONS:
None

ANESTHESIA/SEDATION:
Versed 1 mg IV; Fentanyl 50 mcg IV

Moderate Sedation Time:  10 minutes

The patient was continuously monitored during the procedure by the
interventional radiology nurse under my direct supervision.

CONTRAST:  10 mL of Omnipaque 300-administered into the gastric
lumen.

FLUOROSCOPY TIME:  Fluoroscopy Time: 0 minutes 12 seconds (4 mGy).

COMPLICATIONS:
None immediate.

PROCEDURE:
Informed written consent was obtained from the patient's daughter
after a thorough discussion of the procedural risks, benefits and
alternatives. All questions were addressed. Maximal Sterile Barrier
Technique was utilized including caps, mask, sterile gowns, sterile
gloves, sterile drape, hand hygiene and skin antiseptic. A timeout
was performed prior to the initiation of the procedure.

Existing Foley catheter placed in the gastrostomy opening did not
have an end hole. It was removed. The tract was reaccess with Kumpe
the catheter and mild loss guidewire. Contrast administered through
the Kumpe the catheter under fluoroscopy confirmed appropriate
access of the gastric lumen. Kumpe catheter was exchanged for 18
French balloon retention gastrostomy tube over 0.035 inch Amplatz
guidewire.

The balloon was inflated with 10 mL of normal saline. Contrast
administered through the gastrostomy tube under fluoroscopy
confirmed appropriate access.
IMPRESSION: Successful insertion of a 18 French balloon retention gastrostomy
tube.

## 2023-01-26 IMAGING — DX DG ABDOMEN 1V
1 series · 1 of 1 positions shown · non-contrast
Comparison: Radiograph 11/22/2020, CT 10/22/2020

CLINICAL DATA: Foley catheter placement via G-tube

EXAM:
ABDOMEN - 1 VIEW

[abdomen supine]
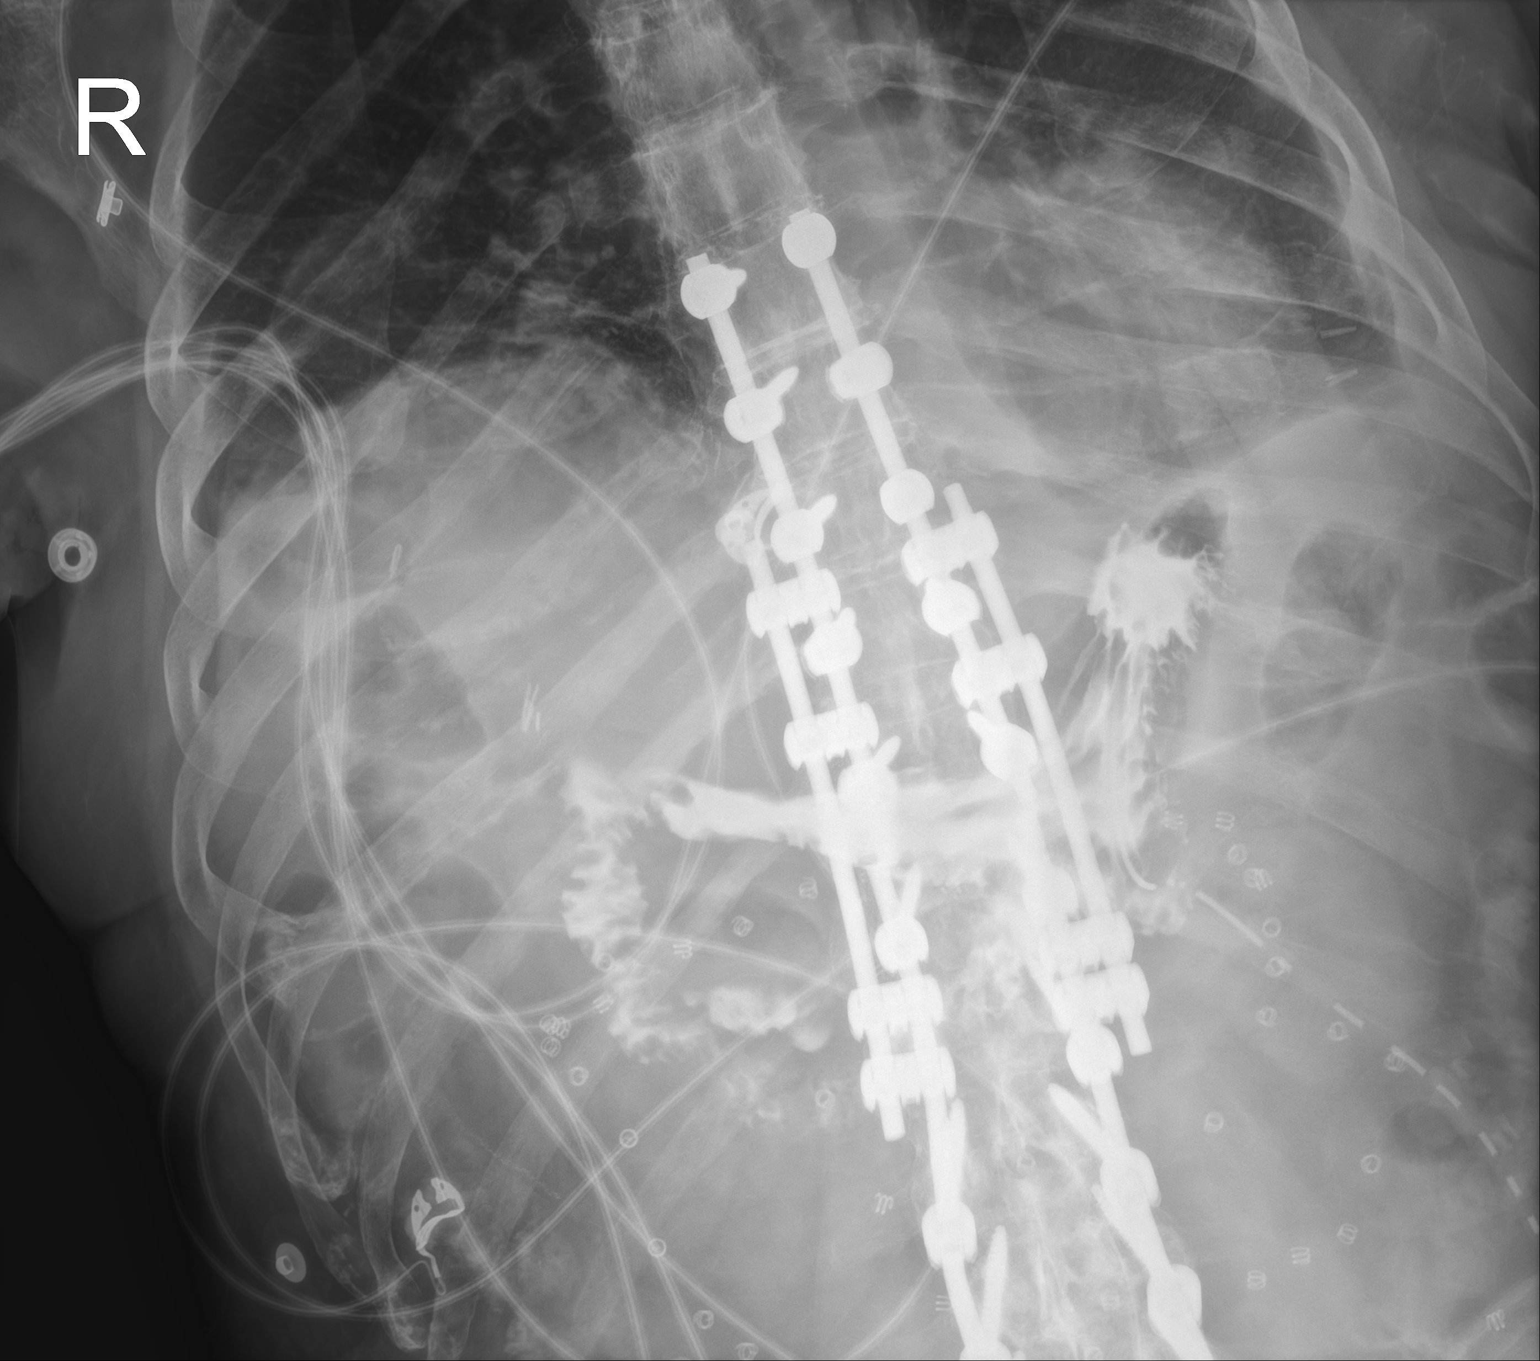

[1 of 1 positions shown; findings below may reference images not displayed]

FINDINGS: Partially opacified catheter tubing projects over the left upper
quadrant compatible with a percutaneous gastrostomy placement.
Contrast administered via this catheter tube partially outlines the
gastric rugal folds and first portion of the duodenum compatible
with intraluminal positioning. Prior abdominal hernia mesh repair.
Additional surgical clips in the right upper quadrant compatible
prior cholecystectomy. Atelectatic changes in the lung bases. Left
basilar thickening/effusion. Extended thoracolumbar fusion is again
seen
IMPRESSION: Percutaneous gastrostomy placement Foley catheter appears
appropriately positioned within the gastric lumen.

Basilar atelectatic changes.  Left pleural effusion.

## 2023-01-29 IMAGING — DX DG CHEST 1V PORT
1 series · 1 of 1 positions shown · non-contrast
Comparison: Chest radiograph 11/11/2020.

CLINICAL DATA: Vomiting and shortness of breath.

EXAM:
PORTABLE CHEST 1 VIEW

[chest]
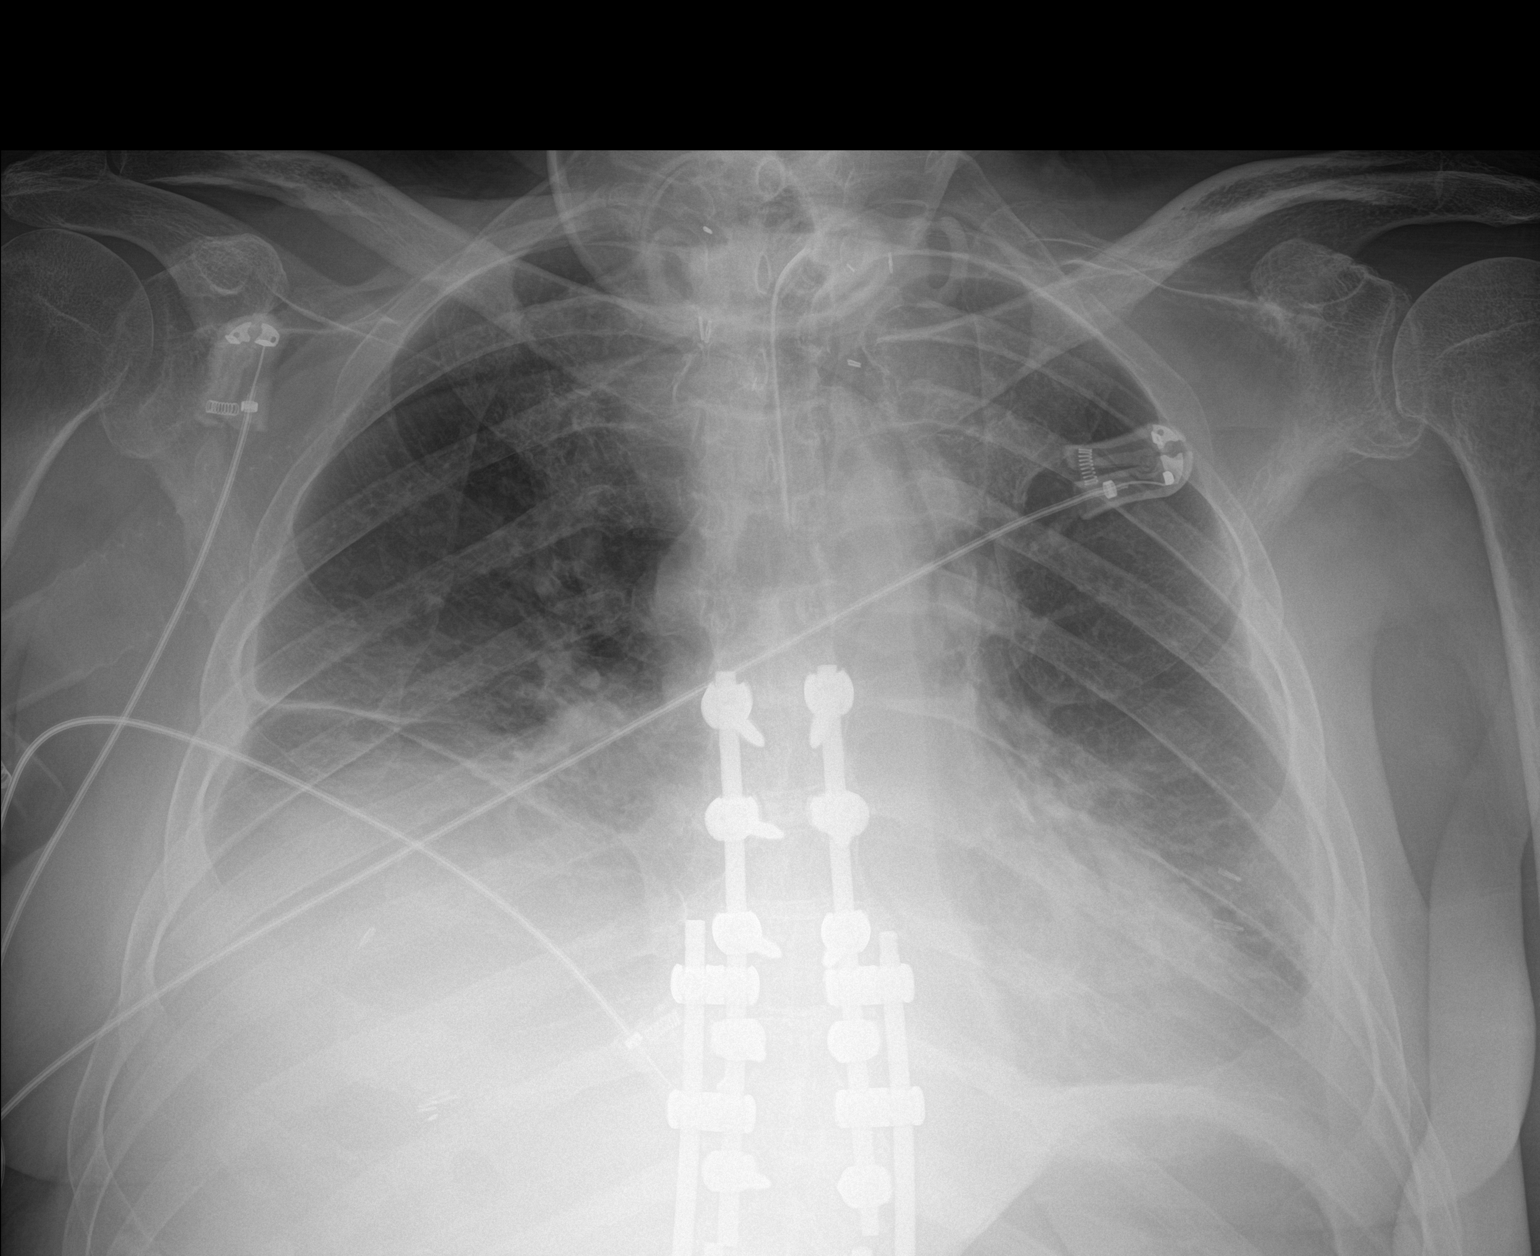

[1 of 1 positions shown; findings below may reference images not displayed]

FINDINGS: Tracheostomy tube tip projects in the mid trachea. Monitoring leads
overlie the patient. Stable cardiac and mediastinal contours.
Elevation the right hemidiaphragm. Small bilateral pleural effusions
with underlying opacities. No pneumothorax.
IMPRESSION: Small bilateral pleural effusions with underlying opacities which
may represent atelectasis or infection.

## 2023-01-29 IMAGING — DX DG ABDOMEN 1V
1 series · 2 of 2 positions shown · non-contrast
Comparison: Abdominal radiograph November 23, 2020.

CLINICAL DATA: Vomiting and shortness of breath.

EXAM:
ABDOMEN - 1 VIEW

[Series 1: abdomen · 0.14mm/px · 2 of 2 slices shown]
[im 1/2]
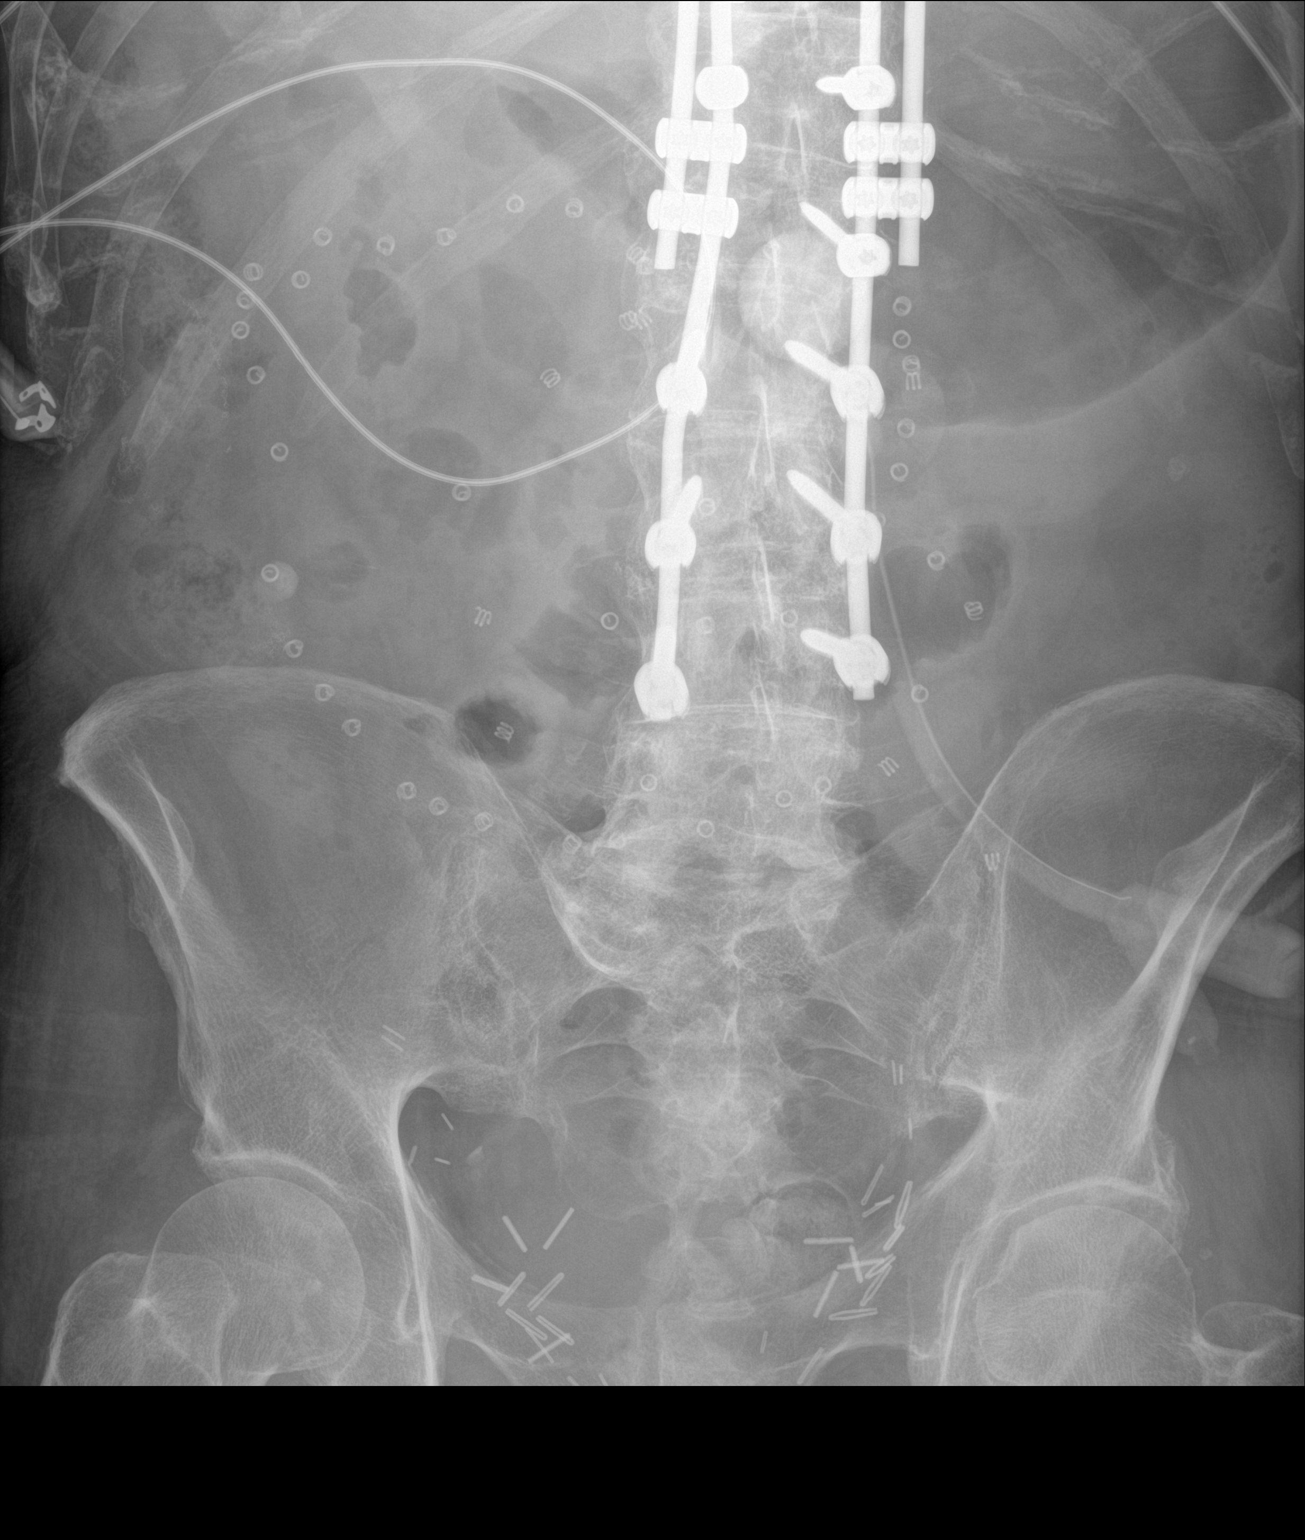
[im 2/2]
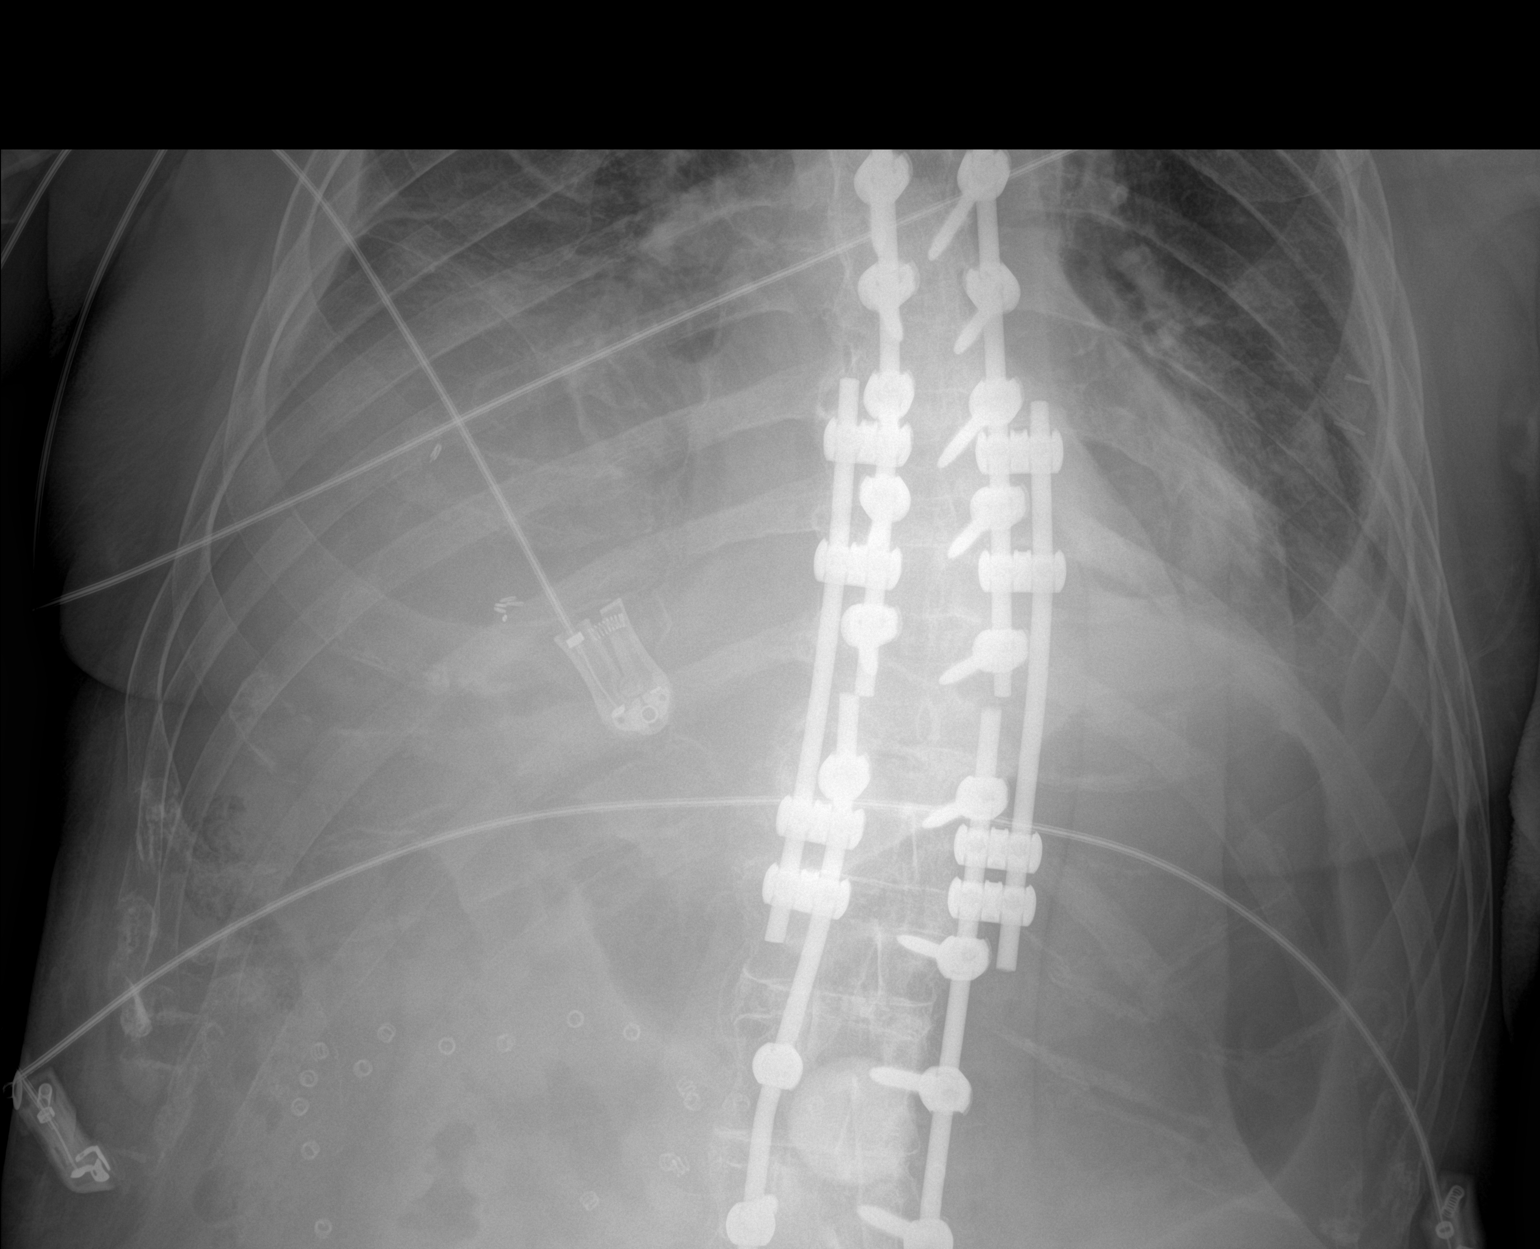

[2 of 2 positions shown; findings below may reference images not displayed]

FINDINGS: Layering bilateral pleural effusions with underlying pulmonary
consolidation. Gastrostomy tube projects over the upper abdomen. Gas
is demonstrated within nondilated loops of large and small bowel in
a nonobstructed pattern. Postsurgical changes within the pelvis.
Postsurgical changes involving the thoracic and lumbar spine. Prior
abdominal hernia mesh repair.
IMPRESSION: Nonobstructed bowel gas pattern.

## 2023-01-30 IMAGING — DX DG CHEST 1V PORT
1 series · 1 of 1 positions shown · non-contrast
Comparison: 11/26/2020 portable chest and earlier.

CLINICAL DATA: 79-year-old male with respiratory failure. History
of lung cancer.

EXAM:
PORTABLE CHEST 1 VIEW

[chest ap]
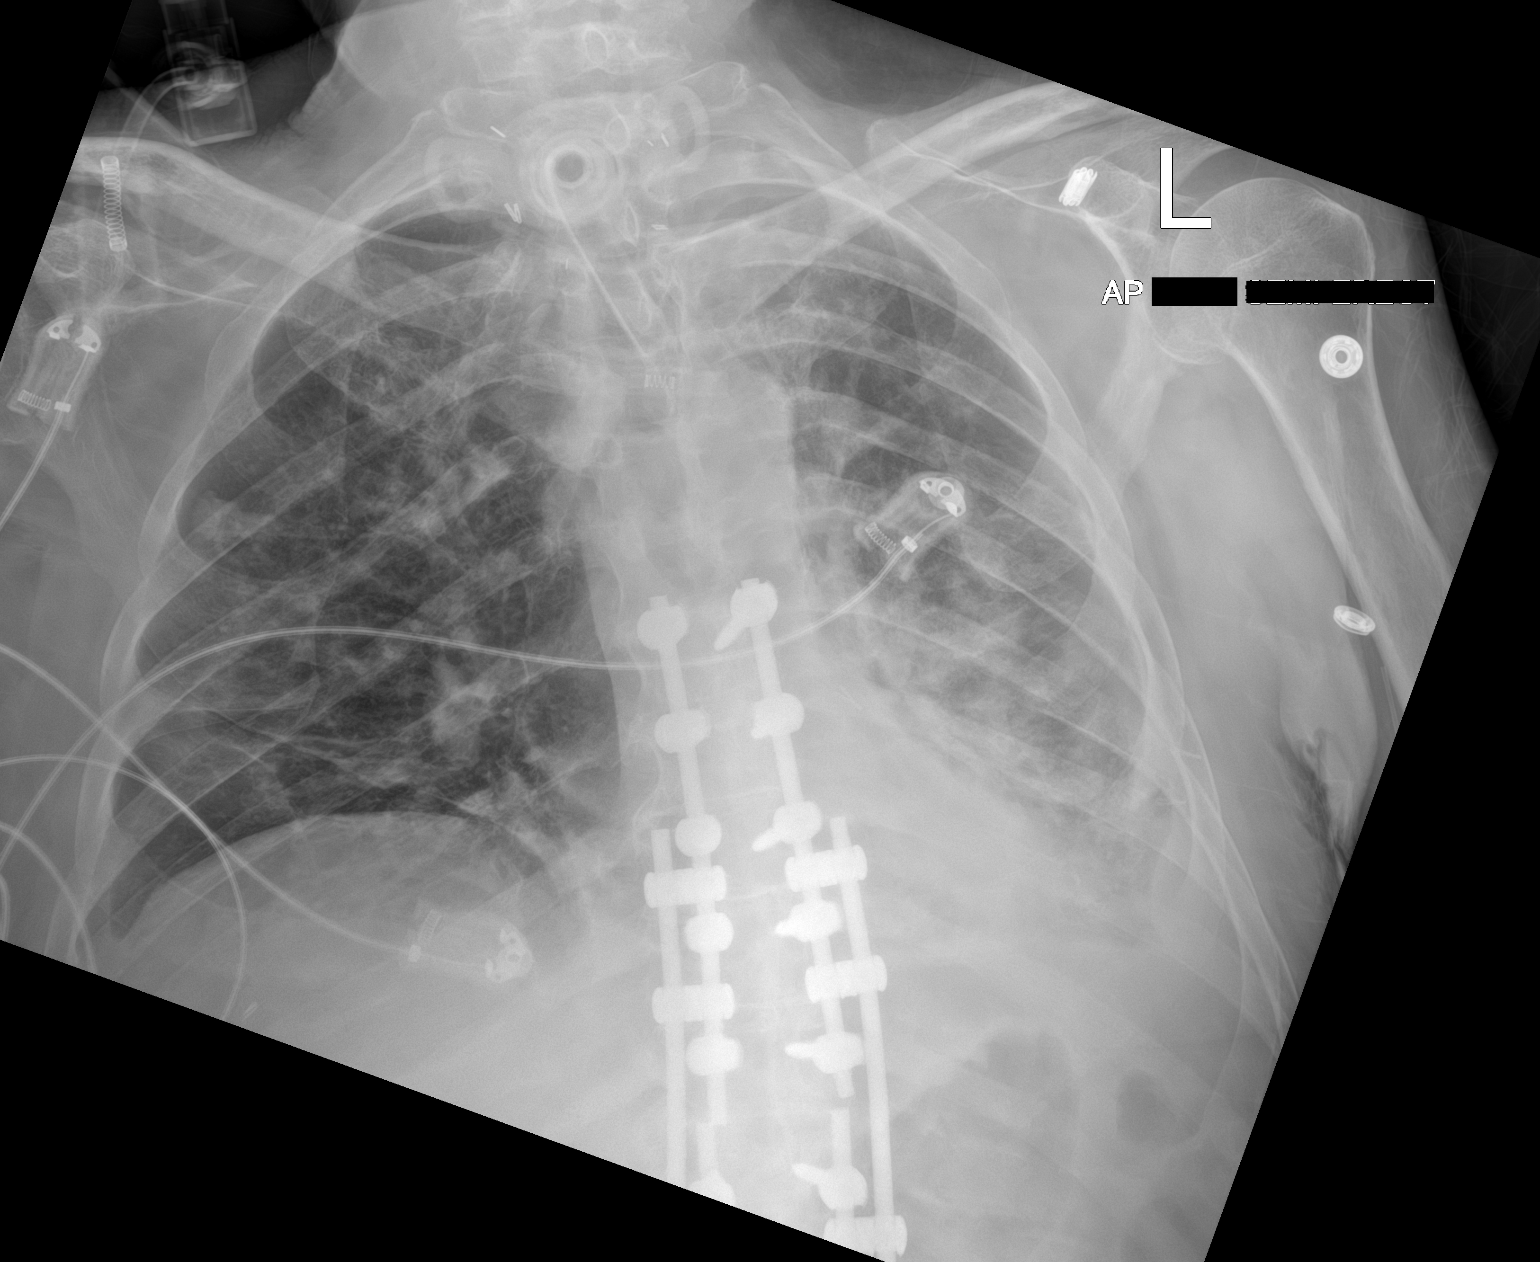

[1 of 1 positions shown; findings below may reference images not displayed]

FINDINGS: Portable AP semi upright view at 7824 hours. Stable tracheostomy.
Continued low lung volumes. Improved right lung base ventilation,
with less veiling opacity there now. But at the same time increased
patchy and confluent left lung base opacity. No pneumothorax. No
pulmonary edema. Stable cardiac size and mediastinal contours.
Extensive thoracolumbar spinal hardware. Negative visible bowel gas
pattern.
IMPRESSION: Continued low lung volumes with improved right but decreased left
lung base ventilation since yesterday. Left lung base opacity could
reflect worsening atelectasis, or infection.

## 2023-02-03 IMAGING — DX DG CHEST 1V PORT
1 series · 1 of 1 positions shown · non-contrast
Comparison: Radiograph 11/27/2020 CT chest 09/10/2020

CLINICAL DATA: Respiratory distress

EXAM:
PORTABLE CHEST 1 VIEW

[chest ap]
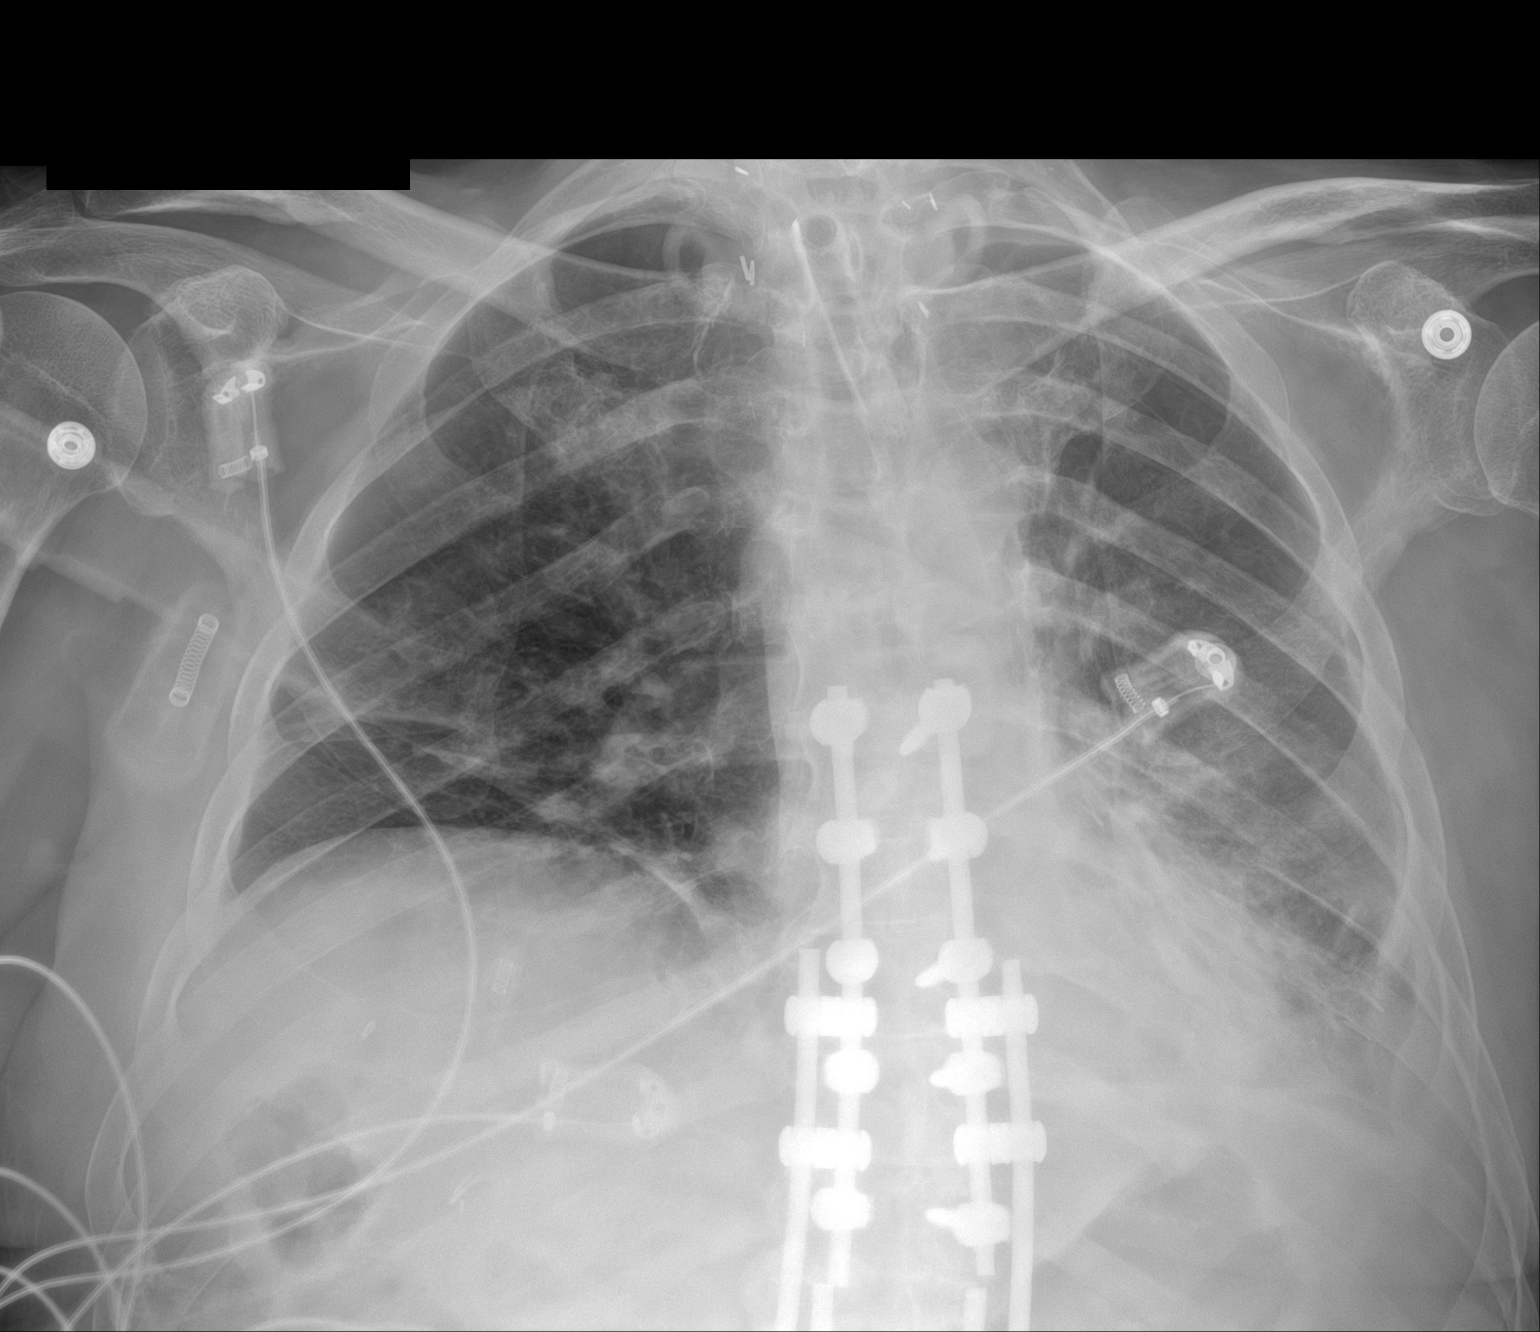

[1 of 1 positions shown; findings below may reference images not displayed]

FINDINGS: Tracheostomy tube unchanged.  Lumbar thoracic fusion noted.

Linear density at the LEFT lung base is unchanged. RIGHT lung base
relatively clear. Low lung volumes.
IMPRESSION: 1. No significant change
2. Persistent LEFT basilar atelectasis versus infiltrate.

## 2023-02-11 IMAGING — DX DG CHEST 1V PORT
1 series · 2 of 2 positions shown · non-contrast
Comparison: 12/04/2020

CLINICAL DATA: Respiratory failure.

EXAM:
PORTABLE CHEST 1 VIEW

[Series 1: chest · 0.14mm/px · 2 of 2 slices shown]
[im 1/2]
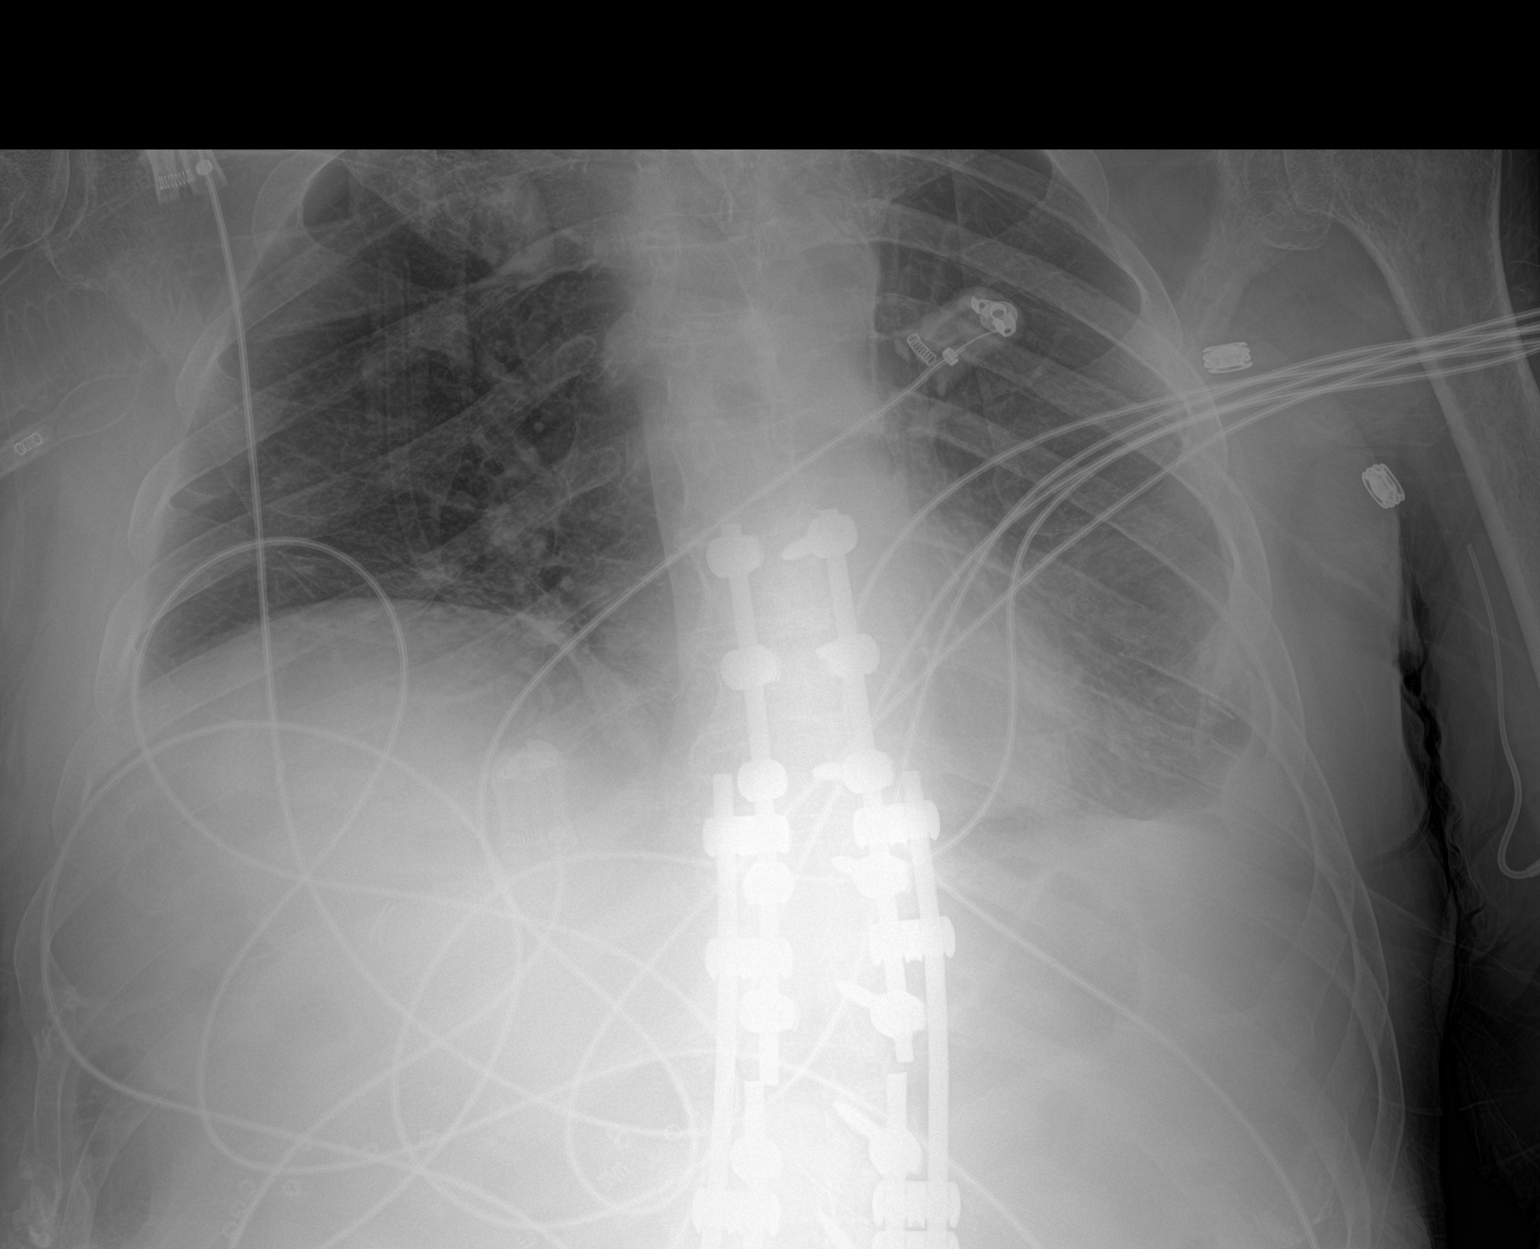
[im 2/2]
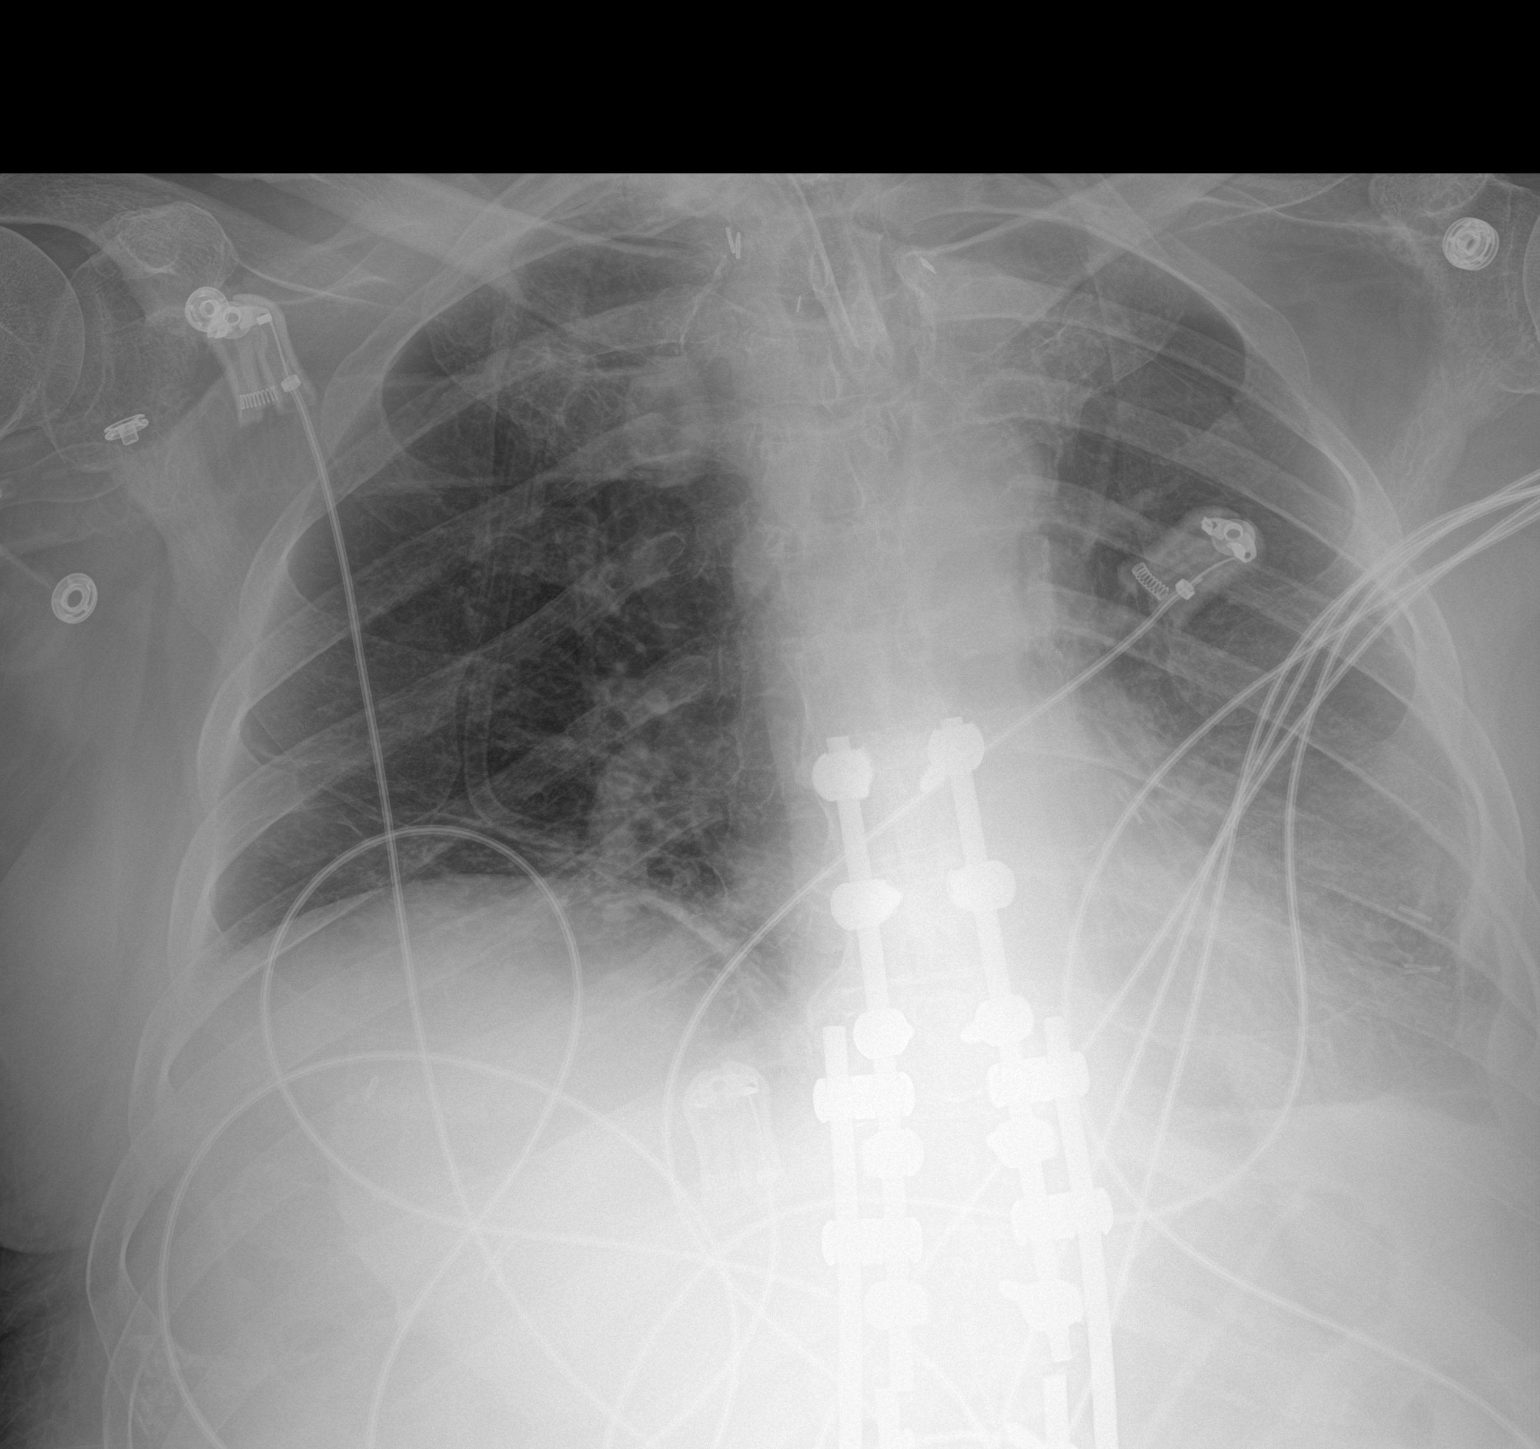

[2 of 2 positions shown; findings below may reference images not displayed]

FINDINGS: ET tube tip is above the carina. The scratch set stable
cardiomediastinal contours. Small left pleural effusion versus
pleural scarring/thickening is unchanged. Decreased appearance of
left base atelectasis.
IMPRESSION: 1. Decrease in left base atelectasis.
2. Stable small left pleural effusion versus pleural
scarring/thickening.

## 2023-02-18 IMAGING — CT CT ABD-PELV W/ CM
2 of 5 series · 16 of 46 positions shown, 18 images · IV contrast (omnipaque)
Comparison: 10/24/2020

CLINICAL DATA: Elevated white blood cell count. Possible
pyelonephritis. Prior gastrostomy. Prostatectomy with pelvic lymph
node dissection in 0630. History of adenocarcinoma of left lung.
Thyroid cancer.

EXAM:
CT ABDOMEN AND PELVIS WITH CONTRAST
TECHNIQUE: Multidetector CT imaging of the abdomen and pelvis was performed
using the standard protocol following bolus administration of
intravenous contrast.
CONTRAST:  100mL OMNIPAQUE IOHEXOL 300 MG/ML  SOLN

[Series 4: abdomen 5.0 · axial · 0.93mm/px · z∈[-1224,-699]mm · 13 of 123 slices shown, 15 images]
[im 9/123  soft-tissue]
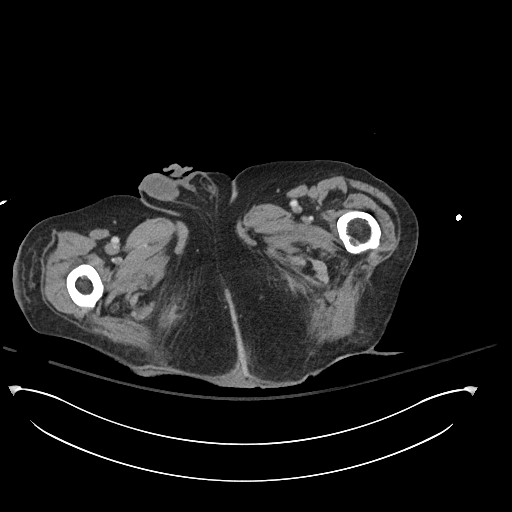
[im 9/123  bone]
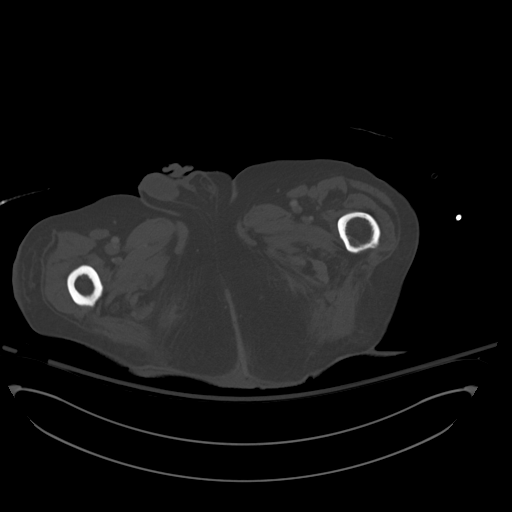
[im 18/123  soft-tissue]
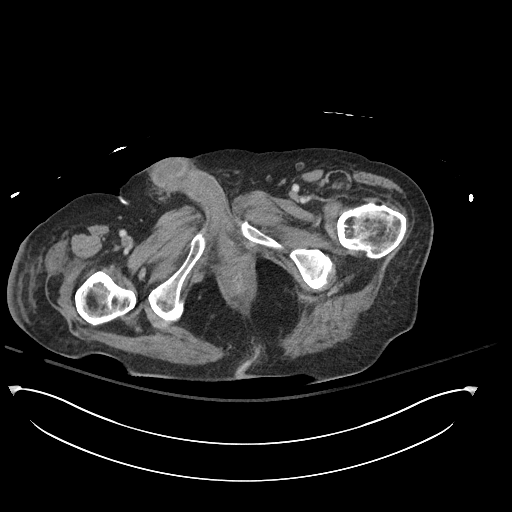
[im 27/123  soft-tissue]
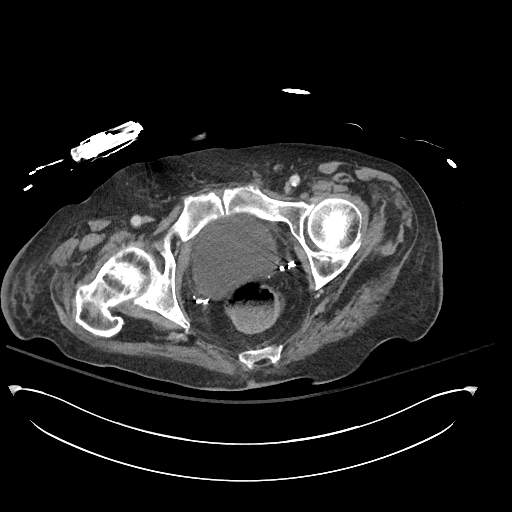
[im 35/123  soft-tissue]
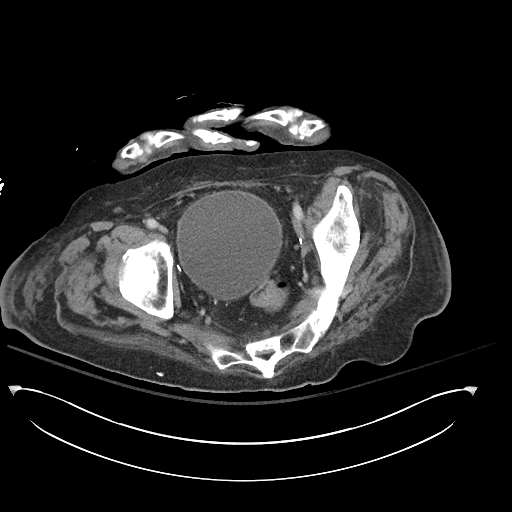
[im 44/123  soft-tissue]
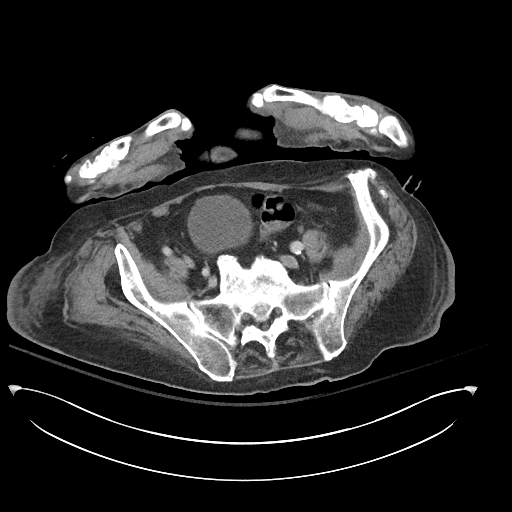
[im 53/123  soft-tissue]
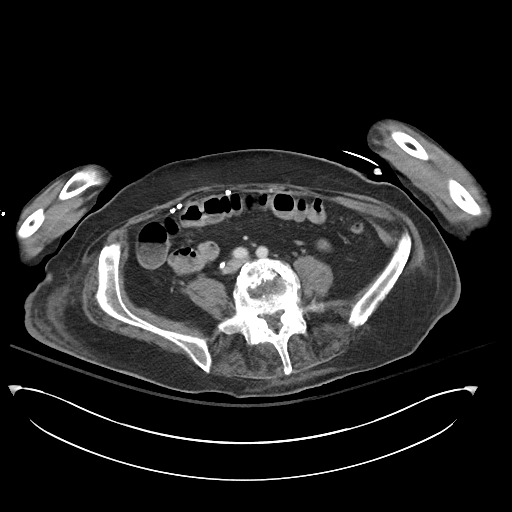
[im 61/123  soft-tissue]
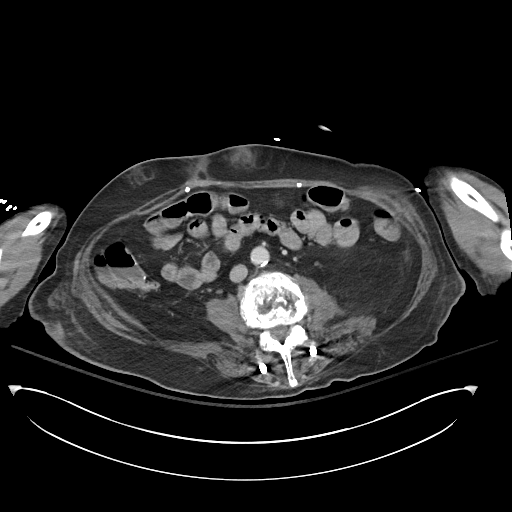
[im 70/123  soft-tissue]
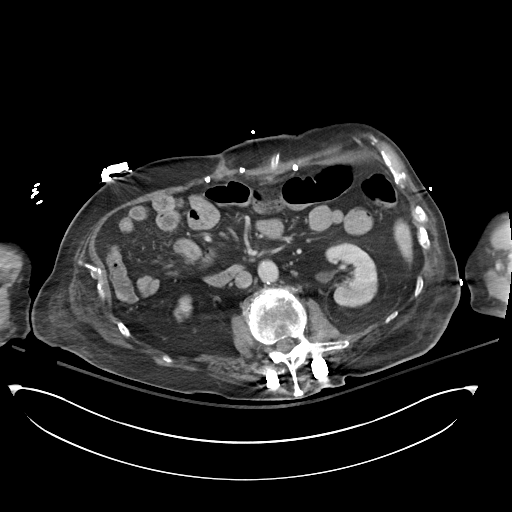
[im 79/123  soft-tissue]
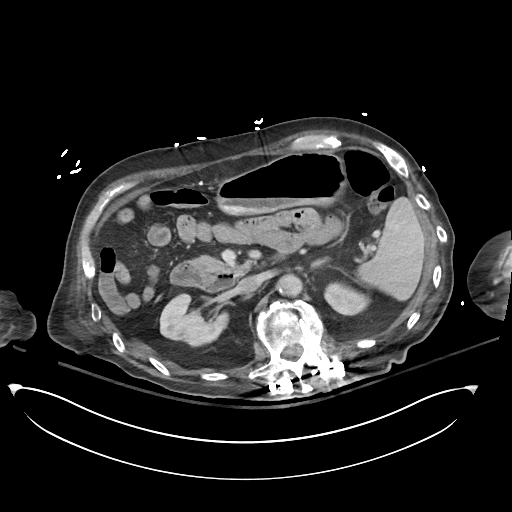
[im 79/123  bone]
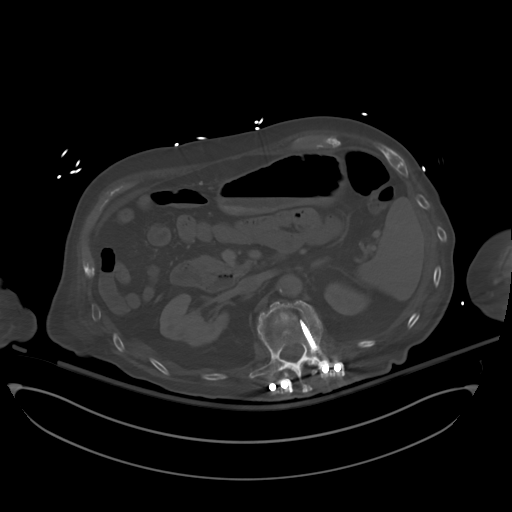
[im 88/123  soft-tissue]
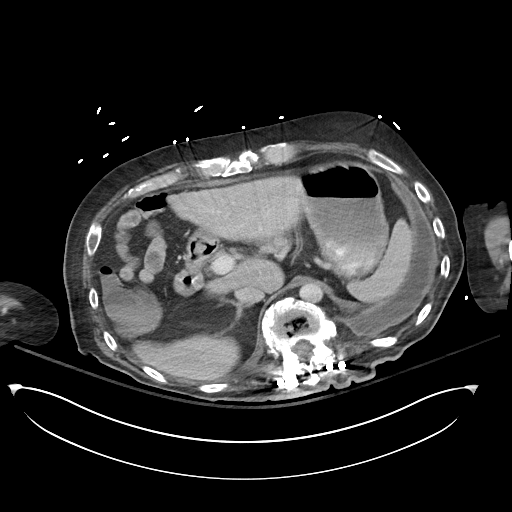
[im 96/123  soft-tissue]
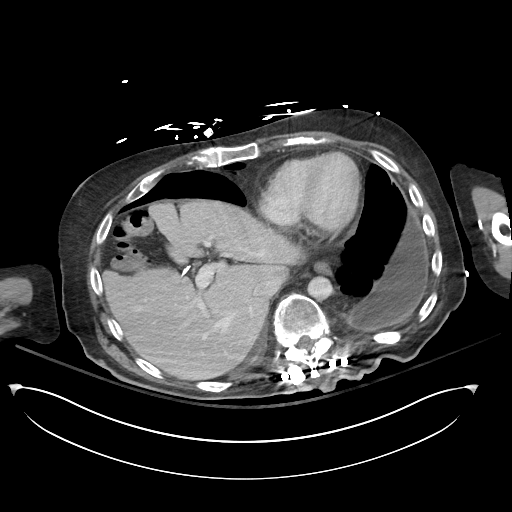
[im 105/123  soft-tissue]
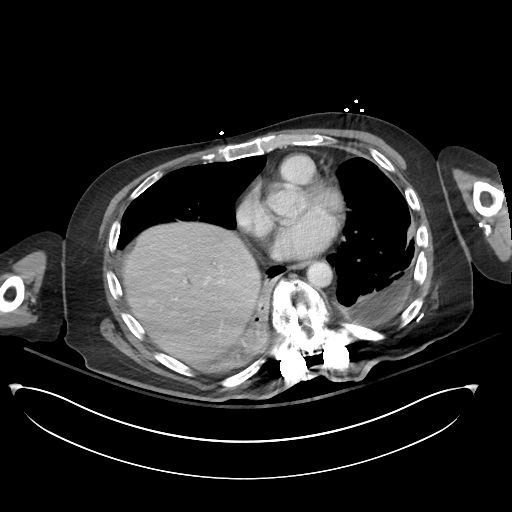
[im 114/123  soft-tissue]
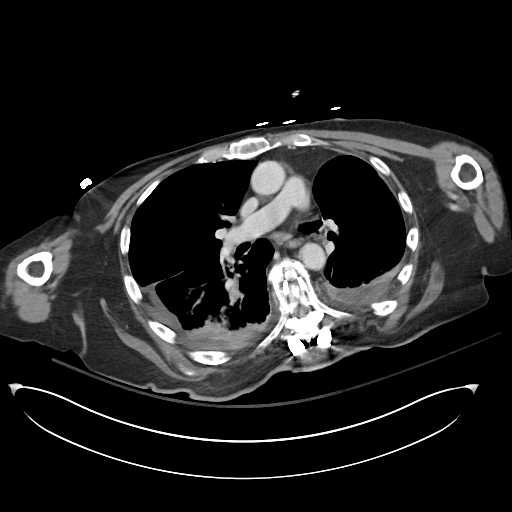

[Series 7: abdomen 3.0 mpr cor · coronal · 0.98mm/px · 3 of 99 slices shown]
[im 33/99  soft-tissue]
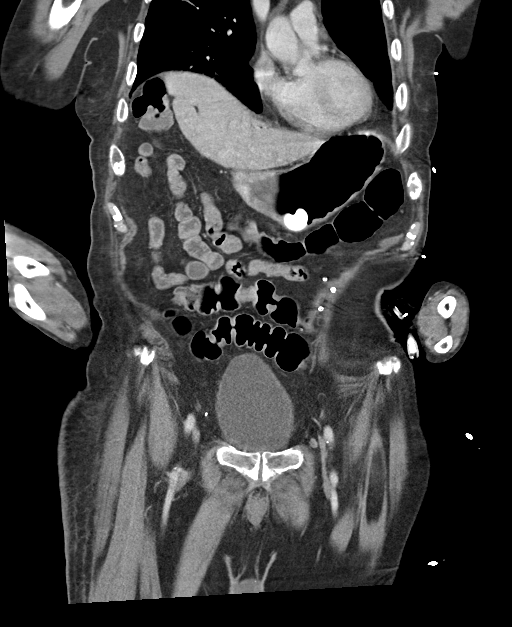
[im 44/99  soft-tissue]
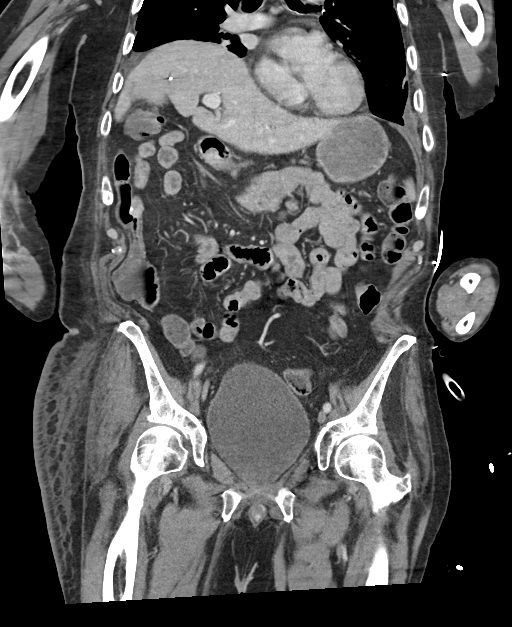
[im 55/99  soft-tissue]
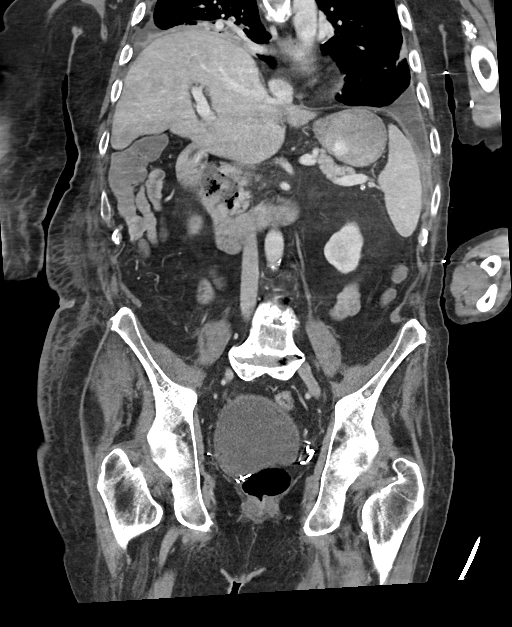

[16 of 46 positions shown; findings below may reference images not displayed]

FINDINGS: Lower chest: Left base dependent atelectasis. Right lower lobe
consolidation. Normal heart size. Small left pleural effusion with
pleural thickening indicative of chronicity, similar to 10/24/2020.
Trace right pleural fluid is also not significantly changed.

Hepatobiliary: Mild degradation secondary to overlying wires and
leads, as well as patient arm position, not raised above the head.
Normal liver. Cholecystectomy, without biliary ductal dilatation.

Pancreas: Normal, without mass or ductal dilatation.

Spleen: Normal in size, without focal abnormality.

Adrenals/Urinary Tract: Normal adrenal glands. 3 mm right renal
collecting system calculus. Normal left kidney, without
hydronephrosis. Normal urinary bladder.

Stomach/Bowel: Gastrostomy tube appropriately positioned. Descending
duodenal diverticulum, with otherwise normal small bowel. Surgical
changes in the ascending colon.

Vascular/Lymphatic: Aortic atherosclerosis. No abdominopelvic
adenopathy.

Reproductive: Prostatectomy and pelvic node dissection.

Other: No significant free fluid. No free intraperitoneal air.
Ventral abdominal wall hernia repair.

Musculoskeletal: Thoracolumbar spine fixation. L1, T12 and T9
compression deformities are similar.
IMPRESSION: 1. No acute process in the abdomen or pelvis.
2. Right lung base consolidation is most consistent with pneumonia.
No other explanation for elevated white blood cell count.
3. Right nephrolithiasis.
4. Chronic small left and trace right pleural effusions.
5. Aortic Atherosclerosis ([id number]).

## 2023-02-20 IMAGING — DX DG CHEST 1V PORT
1 series · 1 of 1 positions shown · non-contrast
Comparison: Nine days ago

CLINICAL DATA: Respiratory failure

EXAM:
PORTABLE CHEST 1 VIEW

[chest]
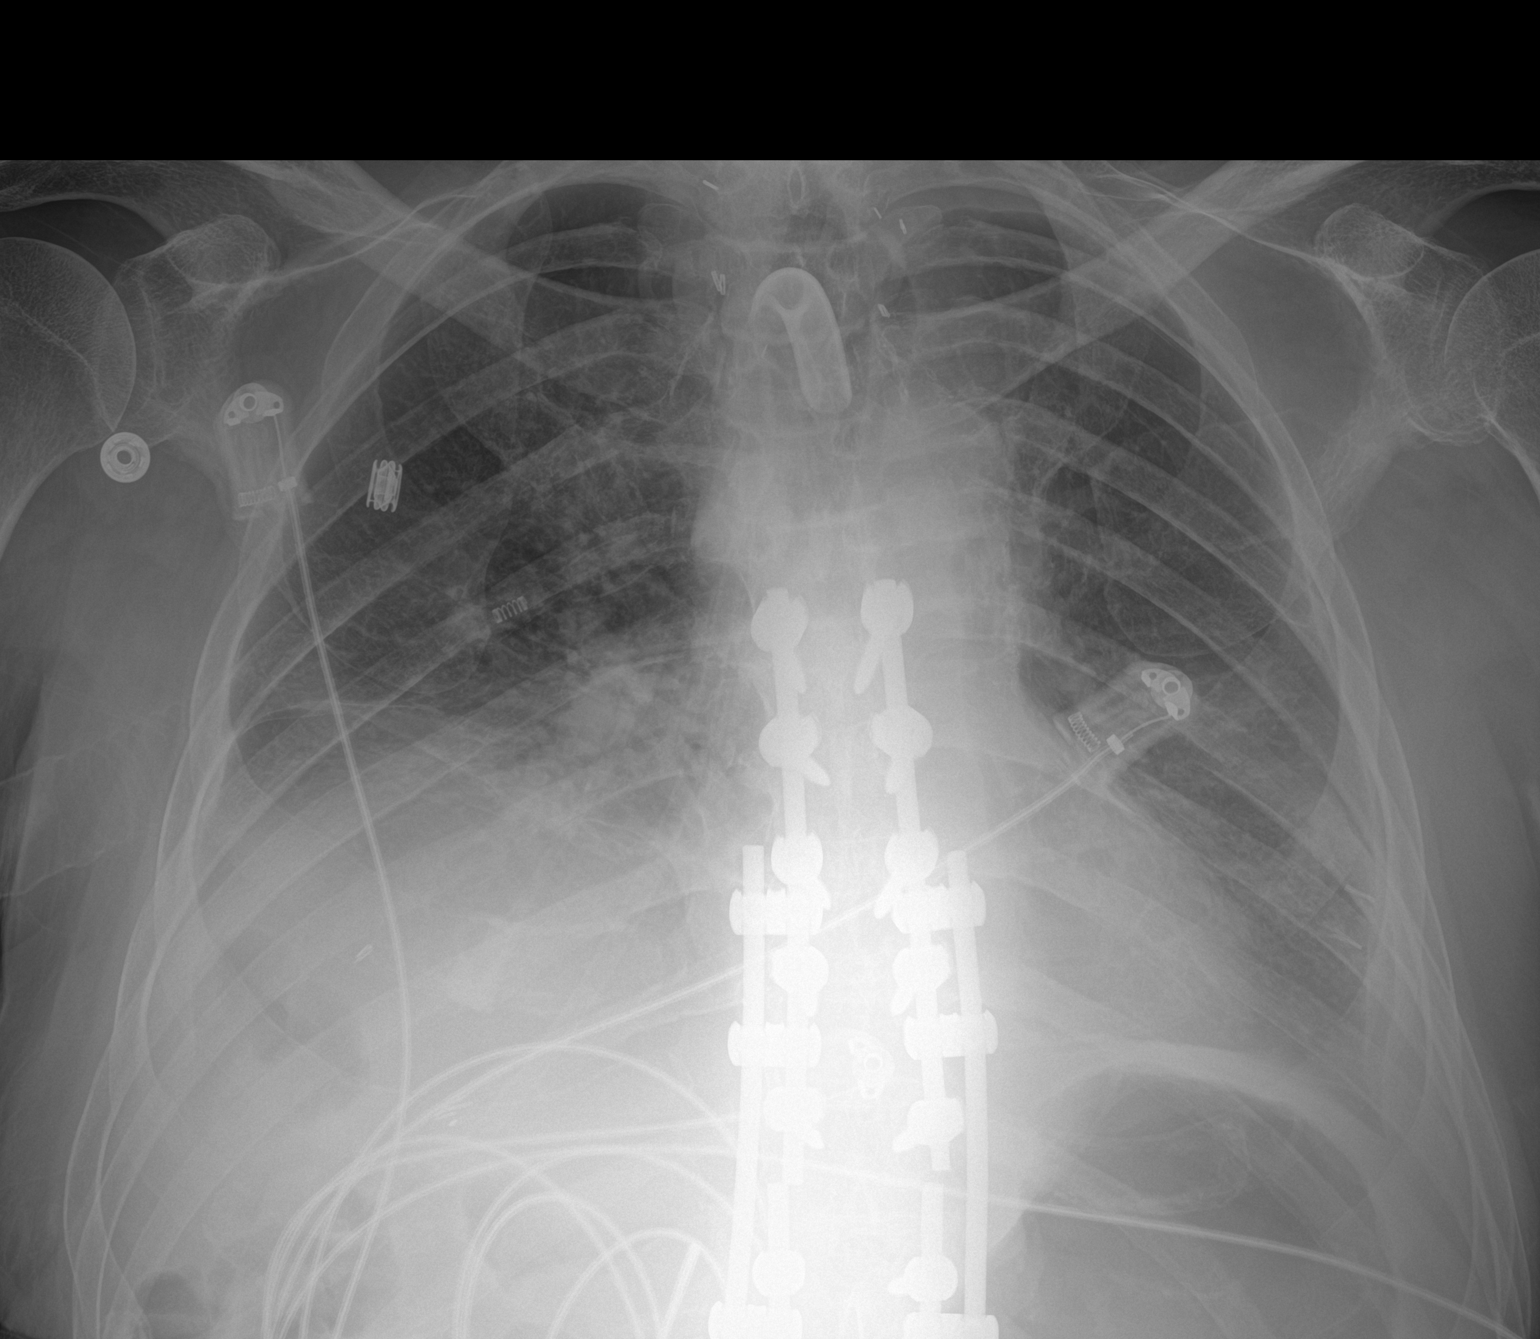

[1 of 1 positions shown; findings below may reference images not displayed]

FINDINGS: Tracheostomy tube in place. Extensive spinal fusion hardware
spanning the thoracic and lumbar spine. Hazy opacity at the bases
that correlates with atelectasis and pleural fluid, worsened. Normal
heart size.
IMPRESSION: Chronic atelectasis and pleural fluid. Aeration has worsened since
12/09/2009.

## 2023-02-23 IMAGING — DX DG CHEST 1V PORT
1 series · 1 of 1 positions shown · non-contrast
Comparison: Portable chest 12/18/2020 and earlier.

CLINICAL DATA: 79-year-old male with respiratory failure. History
of lung and thyroid cancer.

EXAM:
PORTABLE CHEST 1 VIEW

[chest]
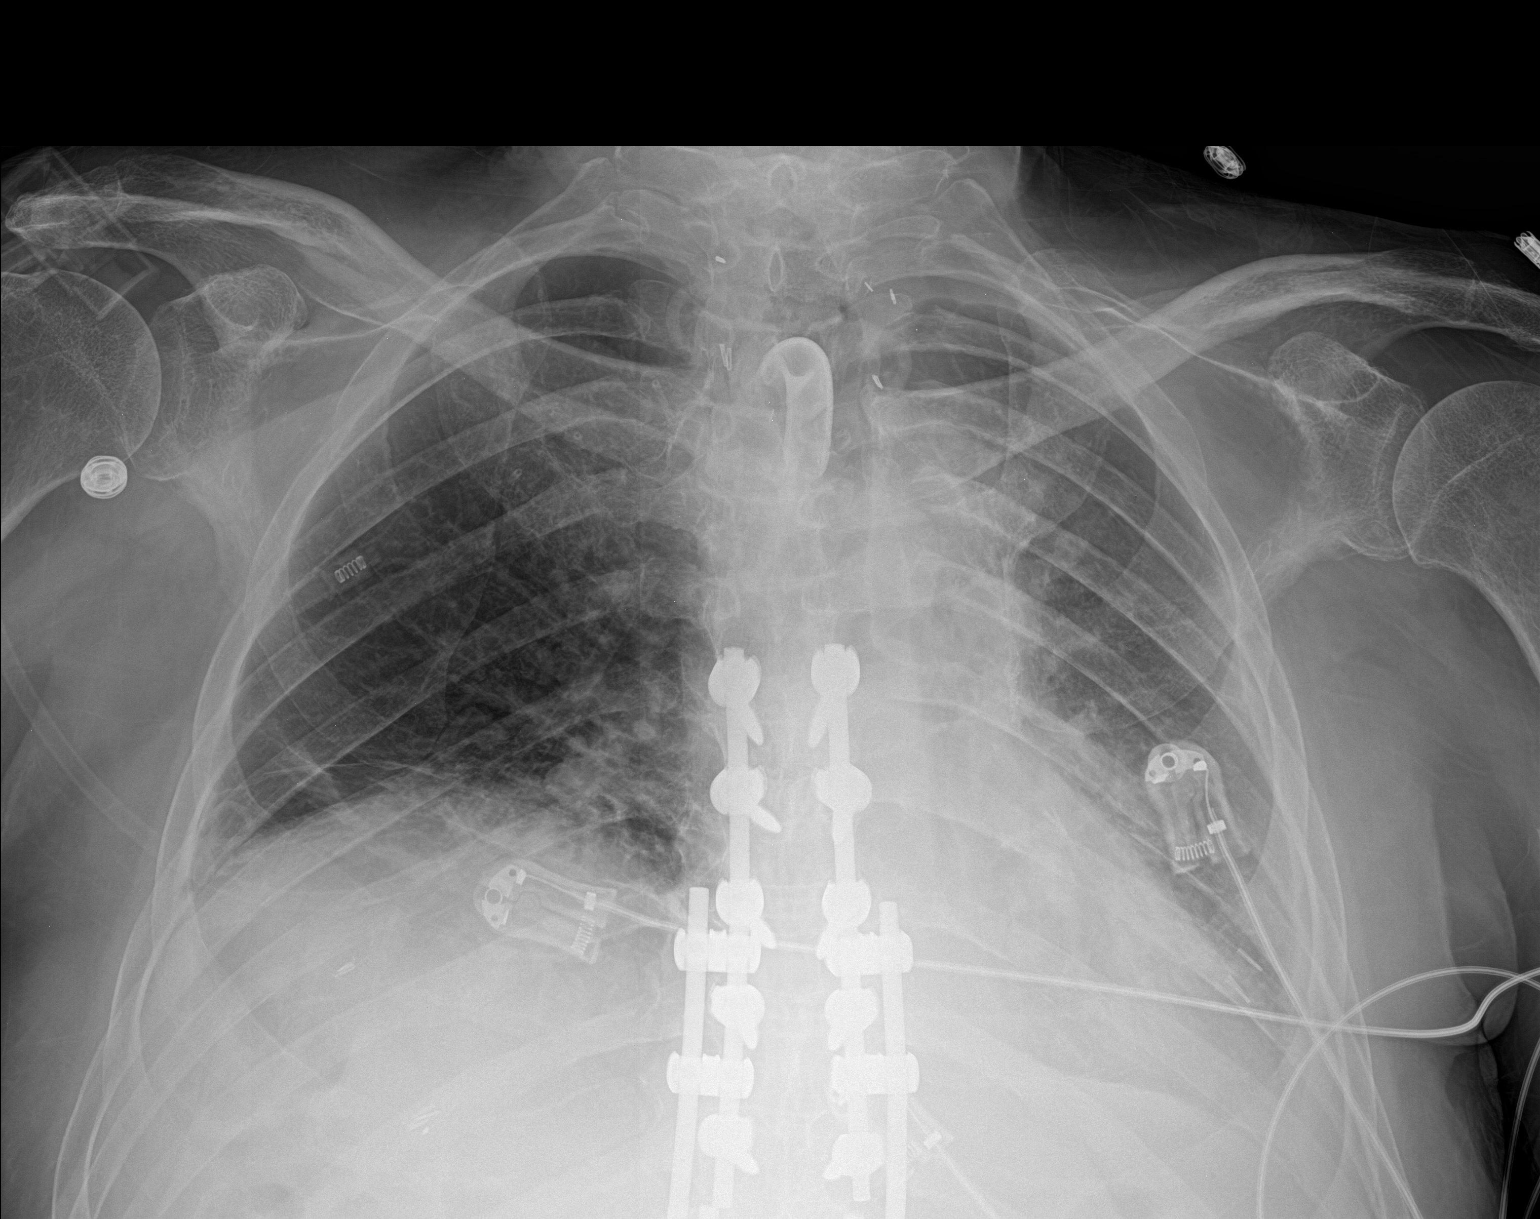

[1 of 1 positions shown; findings below may reference images not displayed]

FINDINGS: Portable AP semi upright view at 0158 hours. Stable tracheostomy.
Other thoracic inlet surgical clips suggesting thyroidectomy.
Thoracic spine fusion hardware continuing to the lumbar spine.
Continued low lung volumes. Stable cardiac size and mediastinal
contours. Chronic lung base hypo ventilation, mildly improved since
12/18/2020, and consolidation demonstrated on CT Abdomen and Pelvis
12/16/2020 suspicious for drowned lung or pneumonia at that time.
No superimposed pneumothorax or pulmonary edema. No definite
consolidation. Small pleural effusions may persist. Paucity of bowel
gas in the upper abdomen.
IMPRESSION: Continued low lung volumes with improved basilar ventilation since
12/18/2020 compatible with regression of pneumonia or atelectasis.
No new cardiopulmonary abnormality.

## 2023-02-27 IMAGING — DX DG CHEST 1V PORT
1 series · 1 of 1 positions shown · non-contrast
Comparison: 12/21/2020

CLINICAL DATA: Respiratory failure.

EXAM:
PORTABLE CHEST 1 VIEW

[chest]
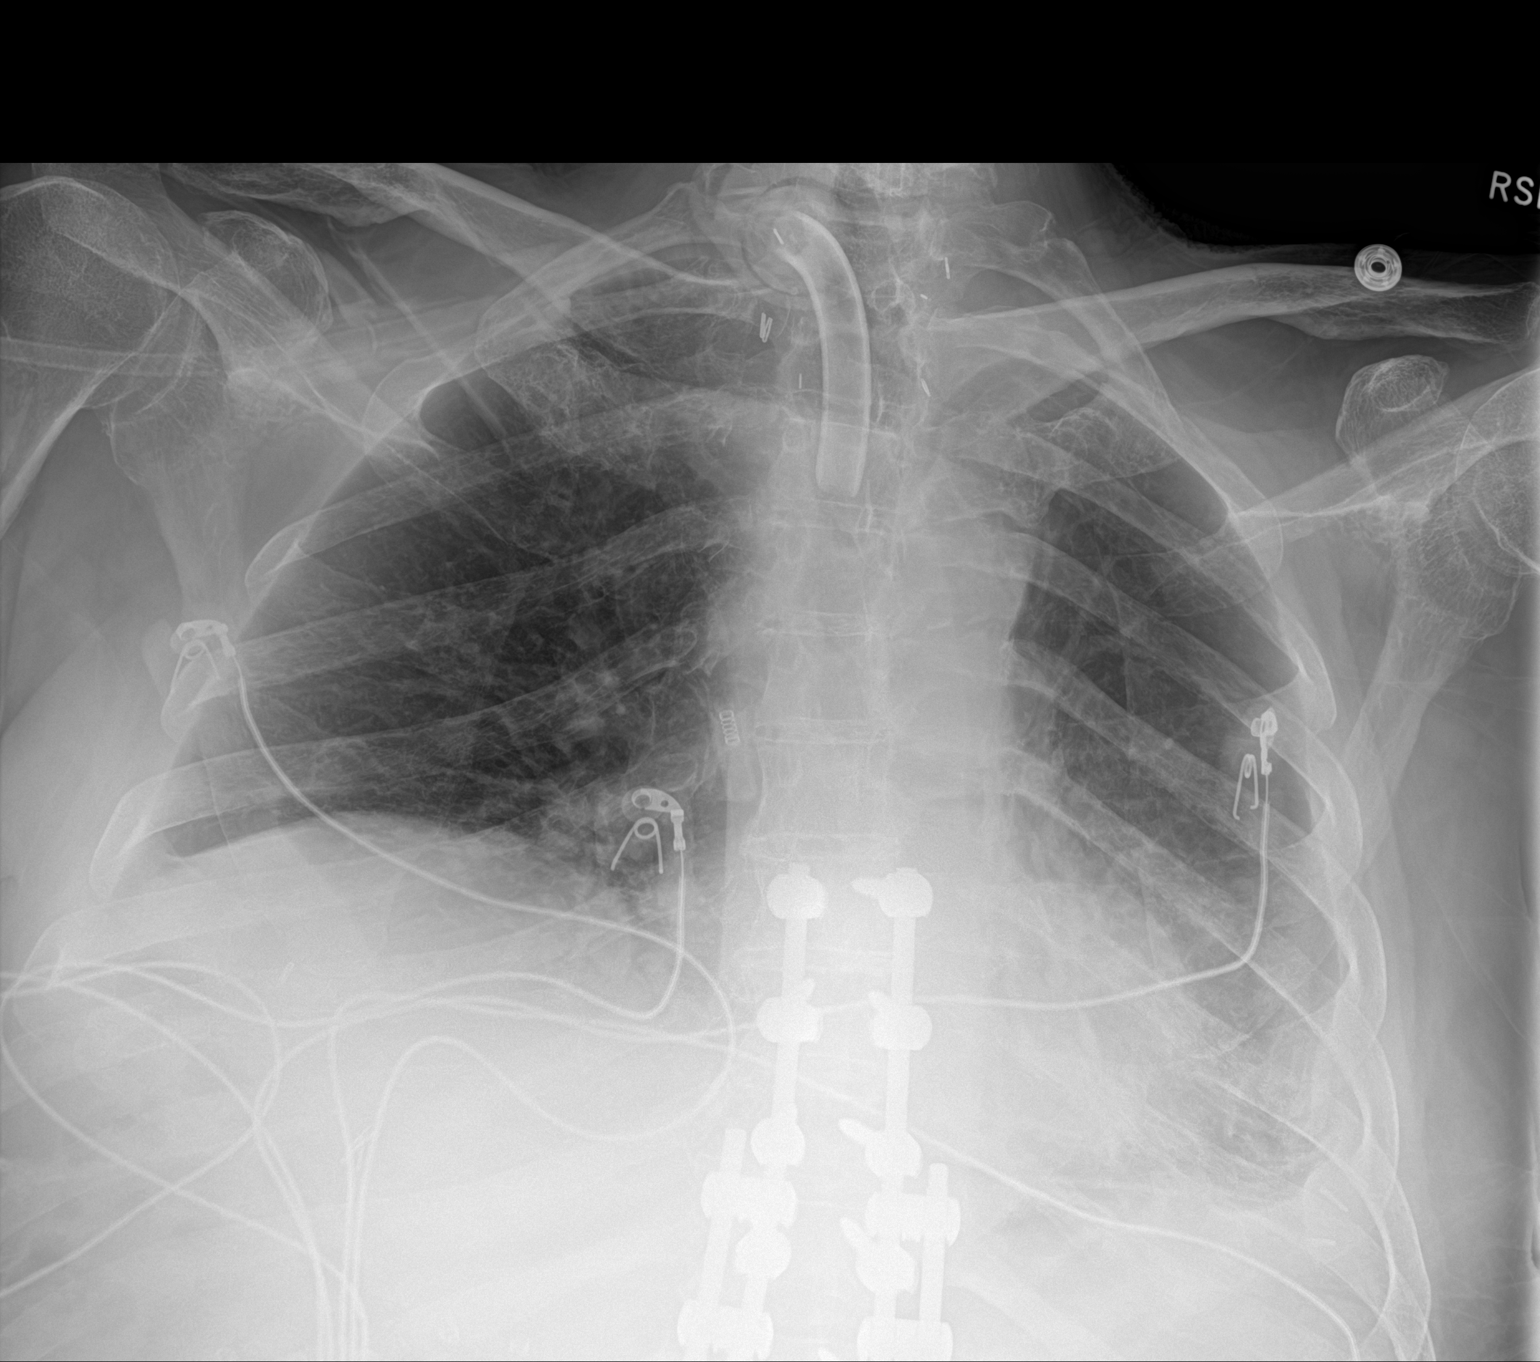

[1 of 1 positions shown; findings below may reference images not displayed]

FINDINGS: Low volume film with stable asymmetric elevation right
hemidiaphragm. Bibasilar atelectasis again noted with small
bilateral pleural effusions. No evidence for pulmonary edema.
Telemetry leads overlie the chest.
IMPRESSION: Low volume film with bibasilar atelectasis and small bilateral
pleural effusions.

## 2023-03-03 IMAGING — DX DG CHEST 1V PORT
1 series · 1 of 1 positions shown · non-contrast
Comparison: 12/25/2020

CLINICAL DATA: Respiratory failure

EXAM:
PORTABLE CHEST 1 VIEW

[chest]
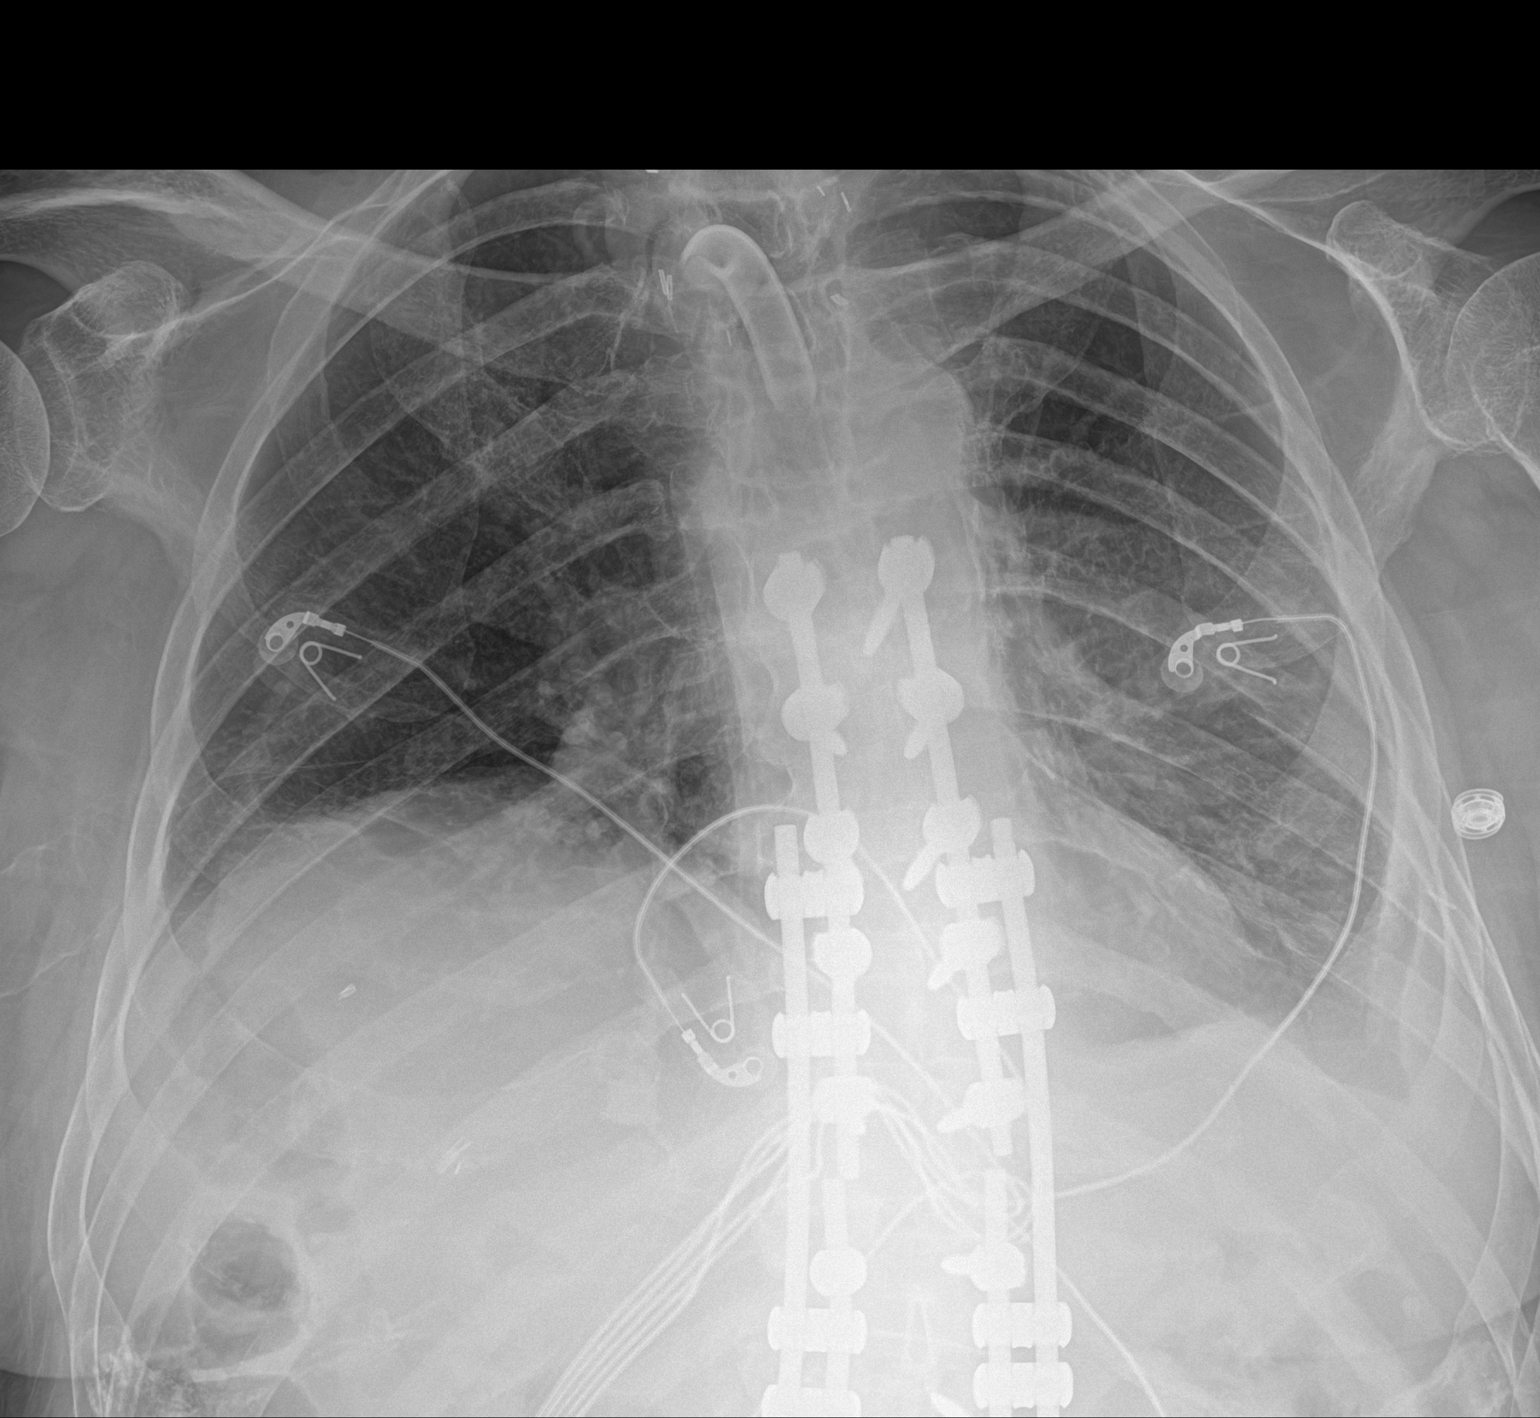

[1 of 1 positions shown; findings below may reference images not displayed]

FINDINGS: Chronic low volume chest. Pleural fluid and pleural thickening on
the left with volume loss. No acute opacity. No pneumothorax.
Tracheostomy tube in good position. Normal heart size. Extensive
thoracolumbar fusion.
IMPRESSION: 1. Stable from 4 days ago.
2. Chronic left pleural effusion with atelectasis/scarring at the
bases.

## 2023-03-11 IMAGING — DX DG CHEST 1V PORT
1 series · 1 of 1 positions shown · non-contrast
Comparison: 12/30/2018

CLINICAL DATA: Hypoxemia.

EXAM:
PORTABLE CHEST 1 VIEW

[chest]
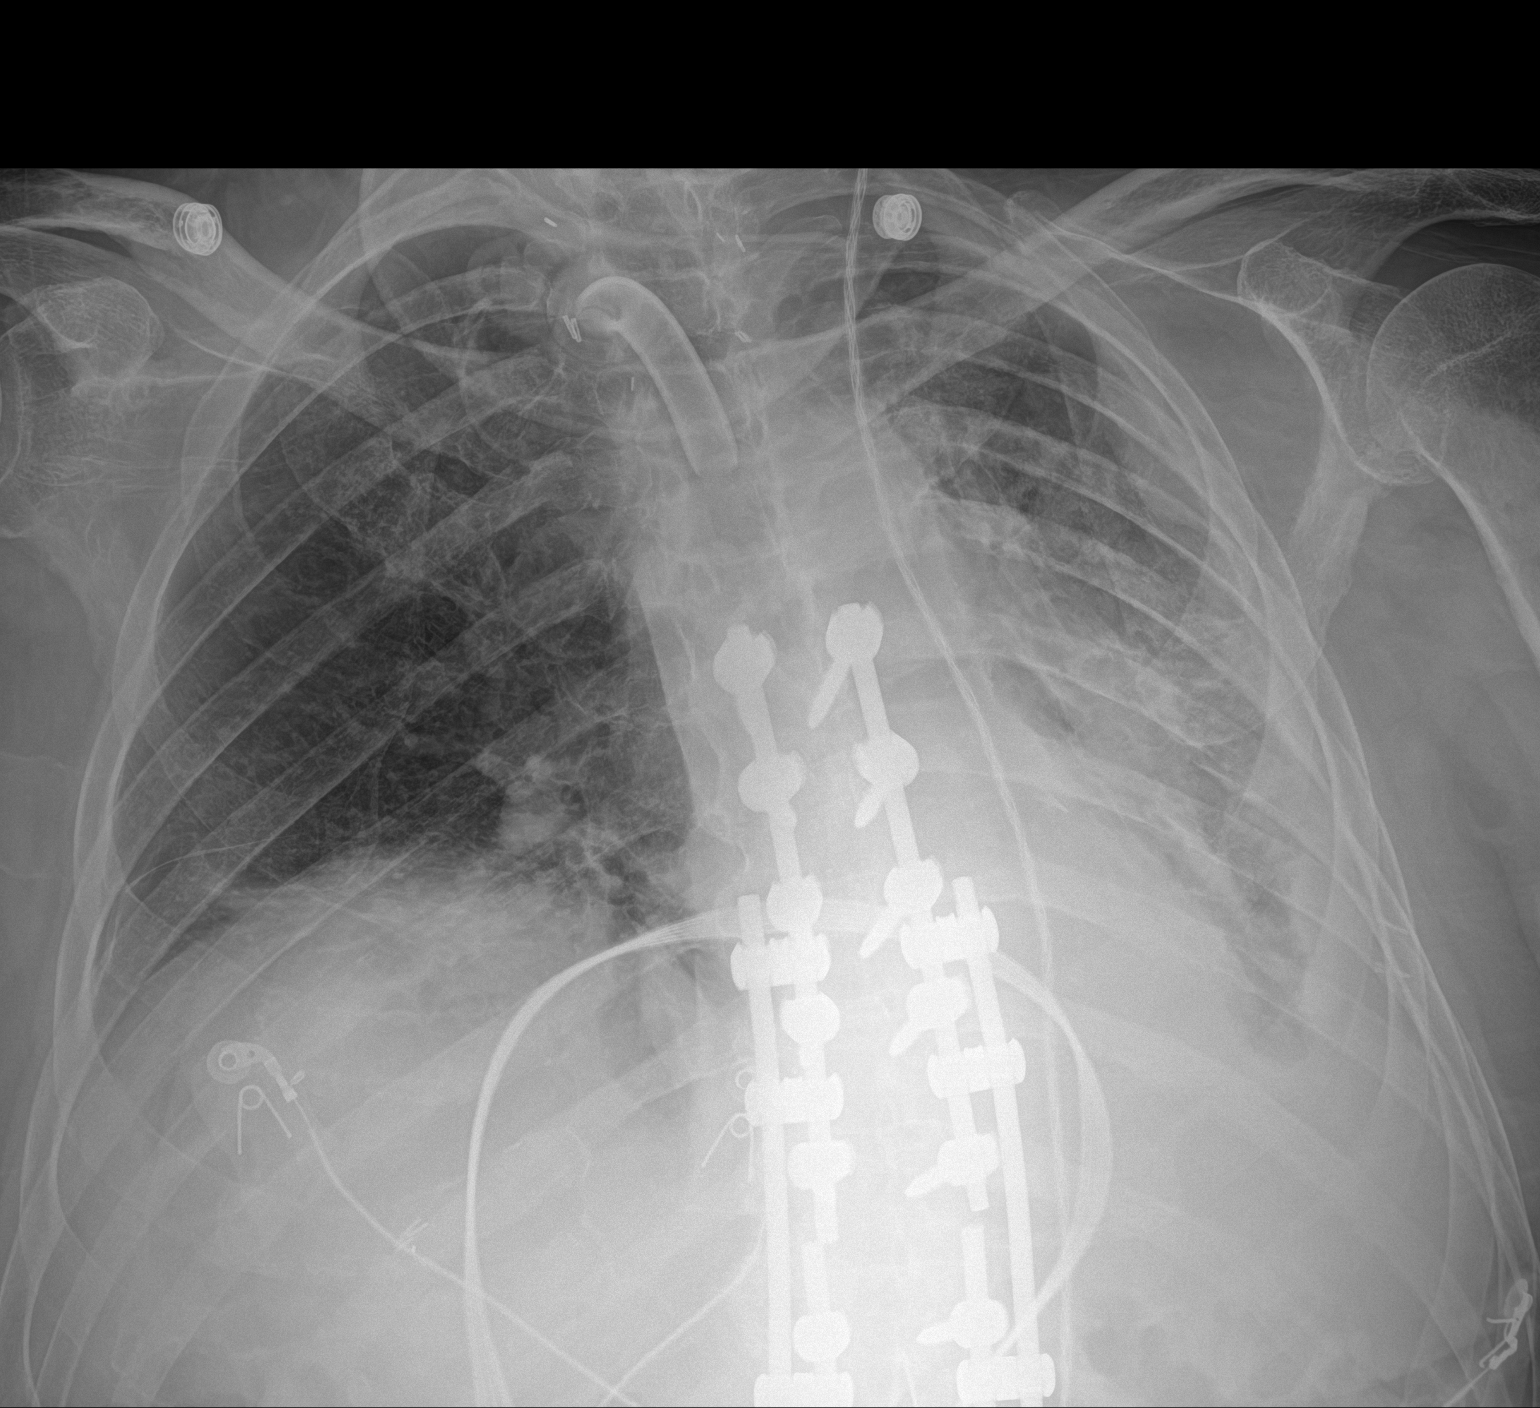

[1 of 1 positions shown; findings below may reference images not displayed]

FINDINGS: Tracheostomy tube tip is above the carina. None lung volumes are
low. There is a left pleural effusion which appears increased in
volume from previous exam. Associated, progressive atelectasis
within the left midlung and left base noted. Mild subsegmental
atelectasis in the right base appears similar to prior exam.
IMPRESSION: 1. Increase in volume of left pleural effusion and atelectasis
within the left midlung and left base.
2. Stable right base subsegmental atelectasis.

## 2023-03-12 IMAGING — DX DG CHEST 1V PORT
1 series · 1 of 1 positions shown · non-contrast
Comparison: January 06, 2021

CLINICAL DATA: Status post thoracentesis

EXAM:
PORTABLE CHEST 1 VIEW

[chest]
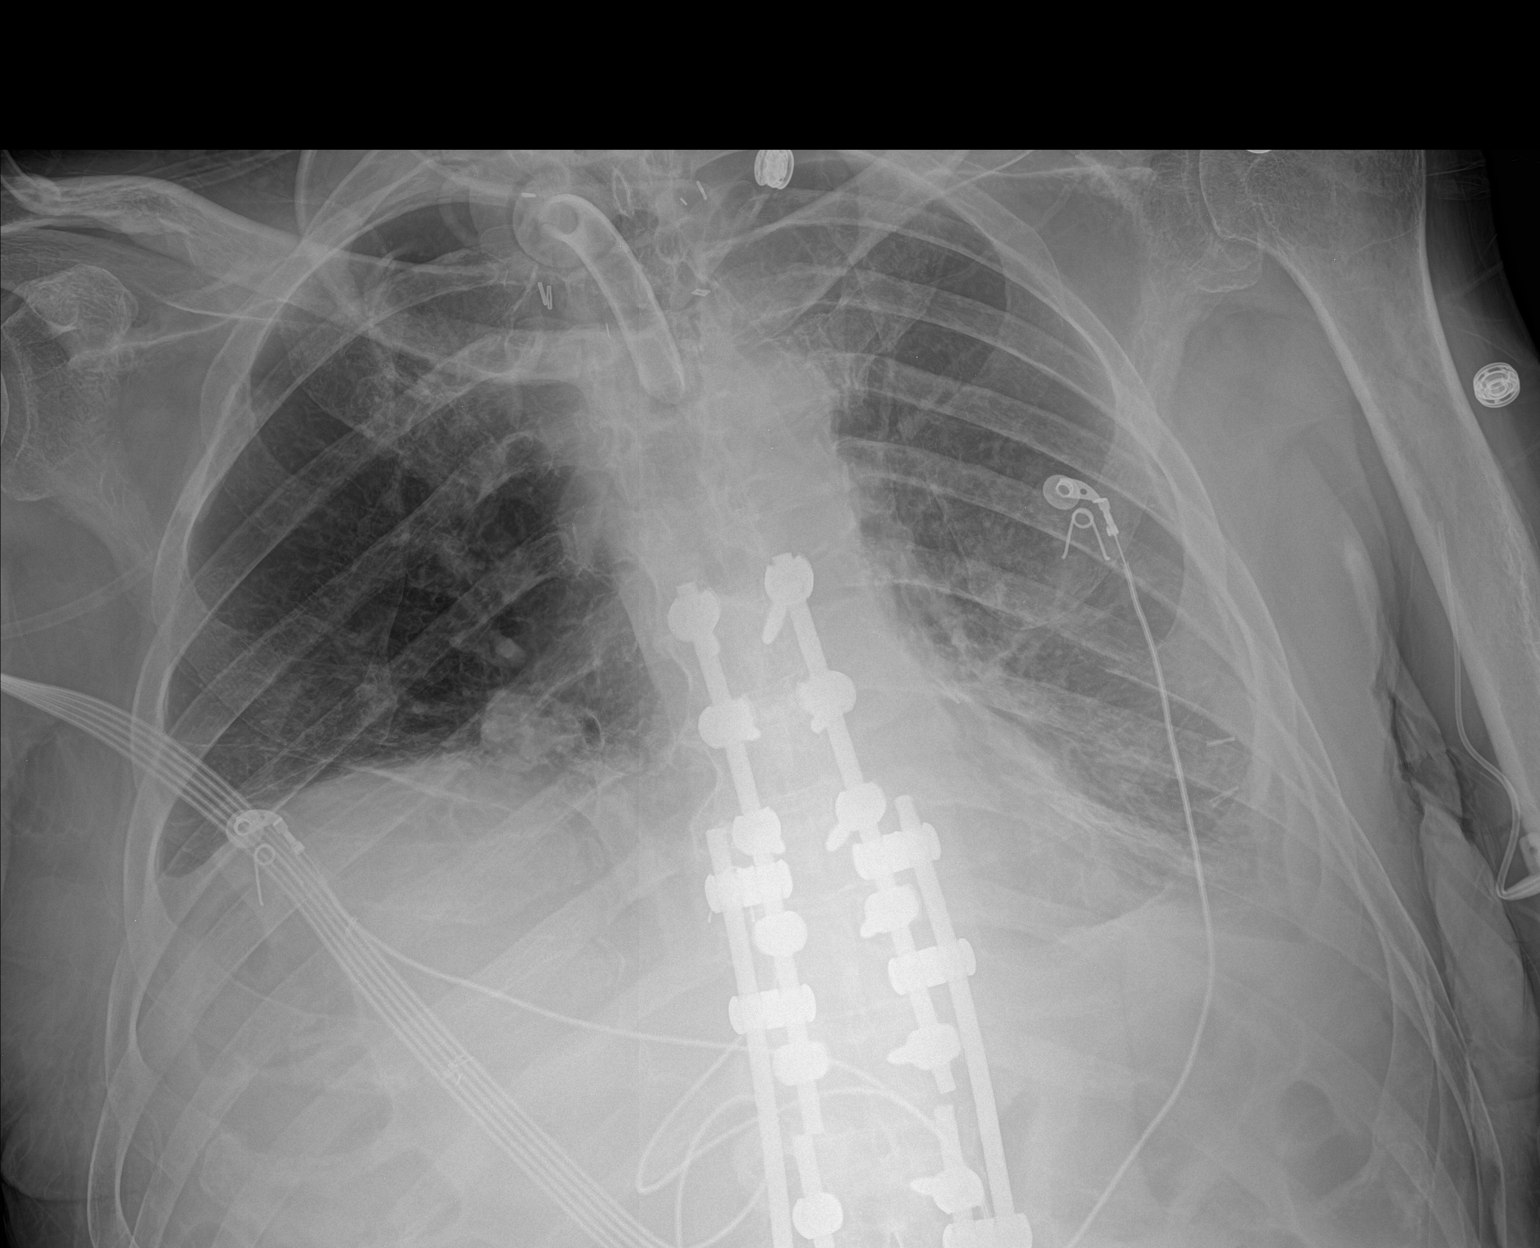

[1 of 1 positions shown; findings below may reference images not displayed]

FINDINGS: No pneumothorax after left thoracentesis. The left-sided pleural
effusion remains but is smaller. Small right pleural effusion with
blunting of the right costophrenic angle. No change in the pedicle
rods and screws in the spine or the tracheostomy tube. No other
interval changes.
IMPRESSION: No pneumothorax after left thoracentesis. Small effusions remain. No
other changes.

## 2023-03-12 IMAGING — US US THORACENTESIS ASP PLEURAL SPACE W/IMG GUIDE
1 series · 7 of 7 positions shown · non-contrast
Comparison: none

INDICATION: Patient with history of acute on chronic respiratory failure. Found
to have a pleural effusion. Request is for therapeutic
thoracentesis.

[Series 1: us thoracentesis asp pleural space w/img guide · 7 of 7 slices shown]
[im 1/7]
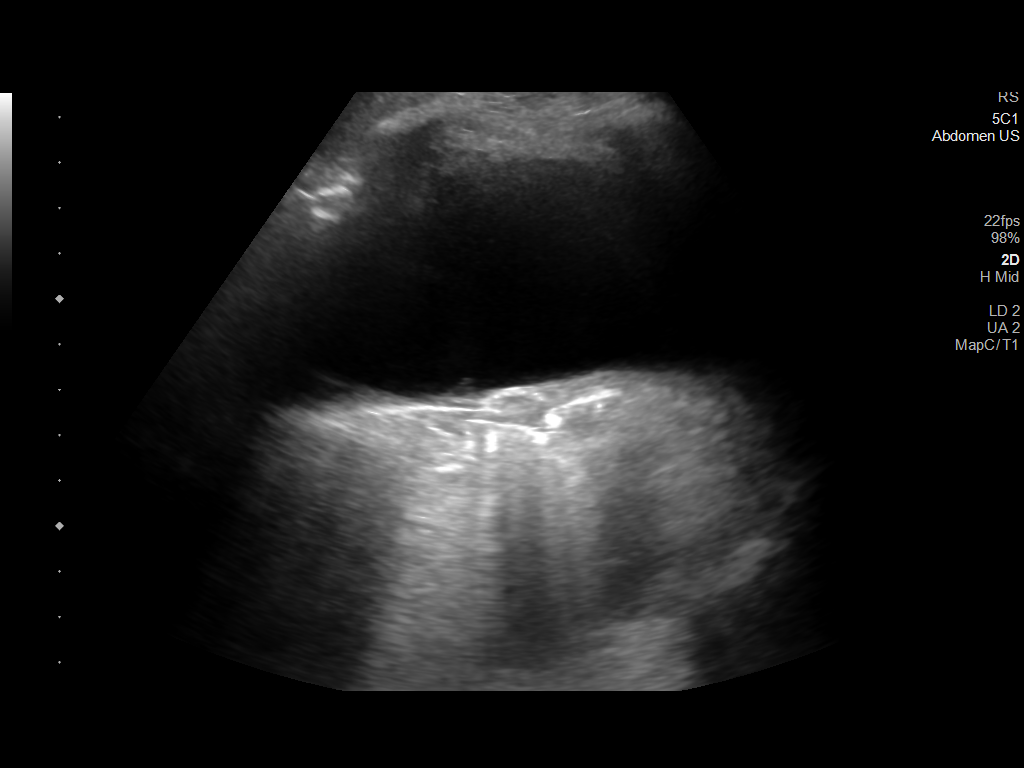
[im 2/7]
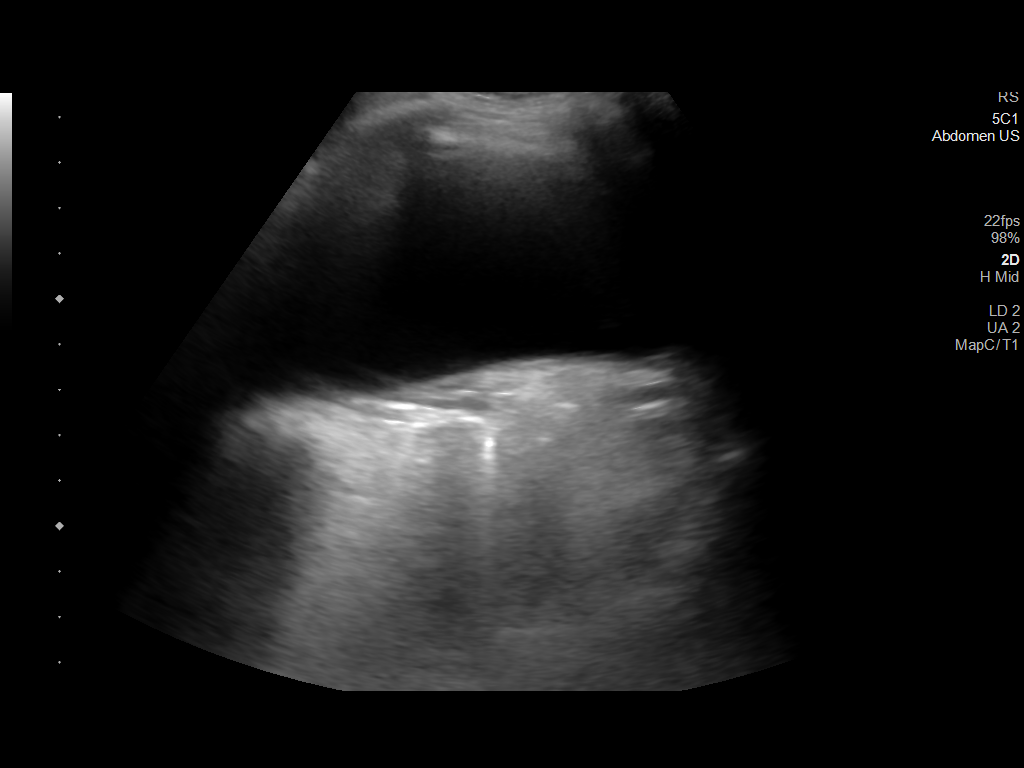
[im 3/7]
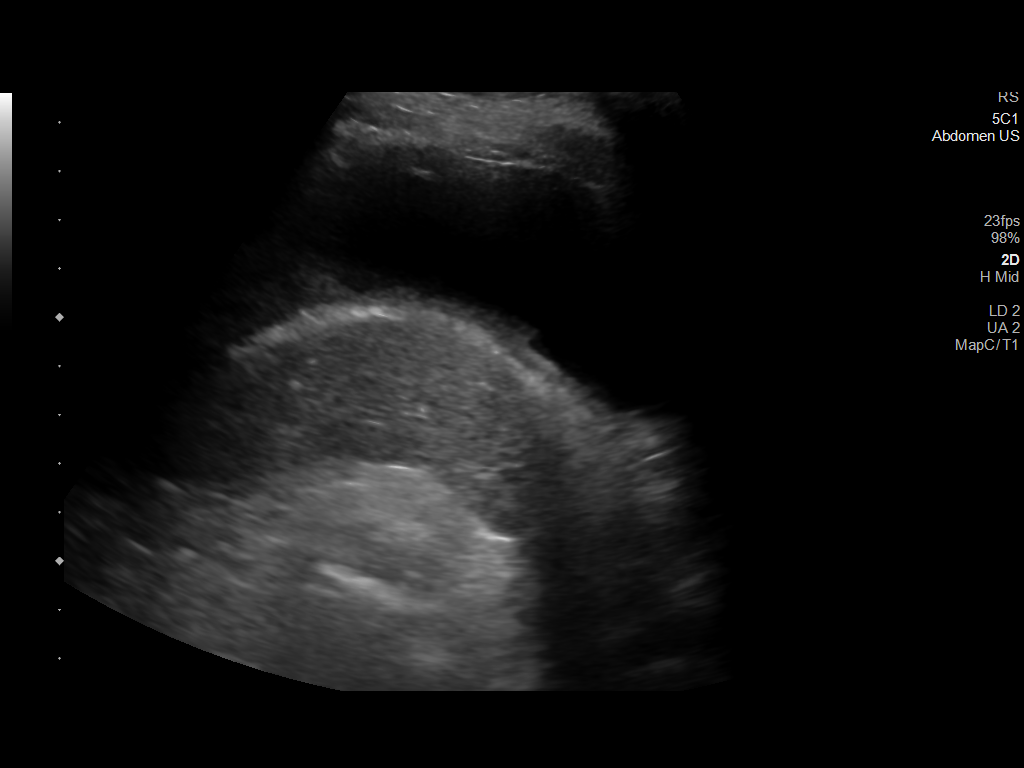
[im 4/7]
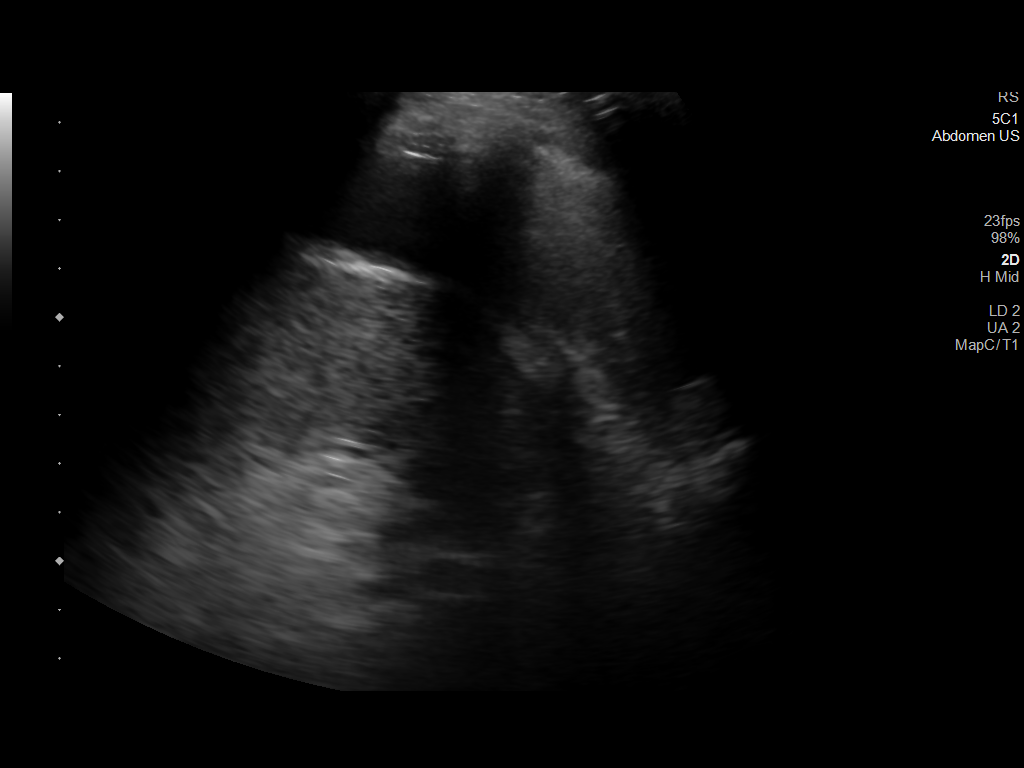
[im 5/7]
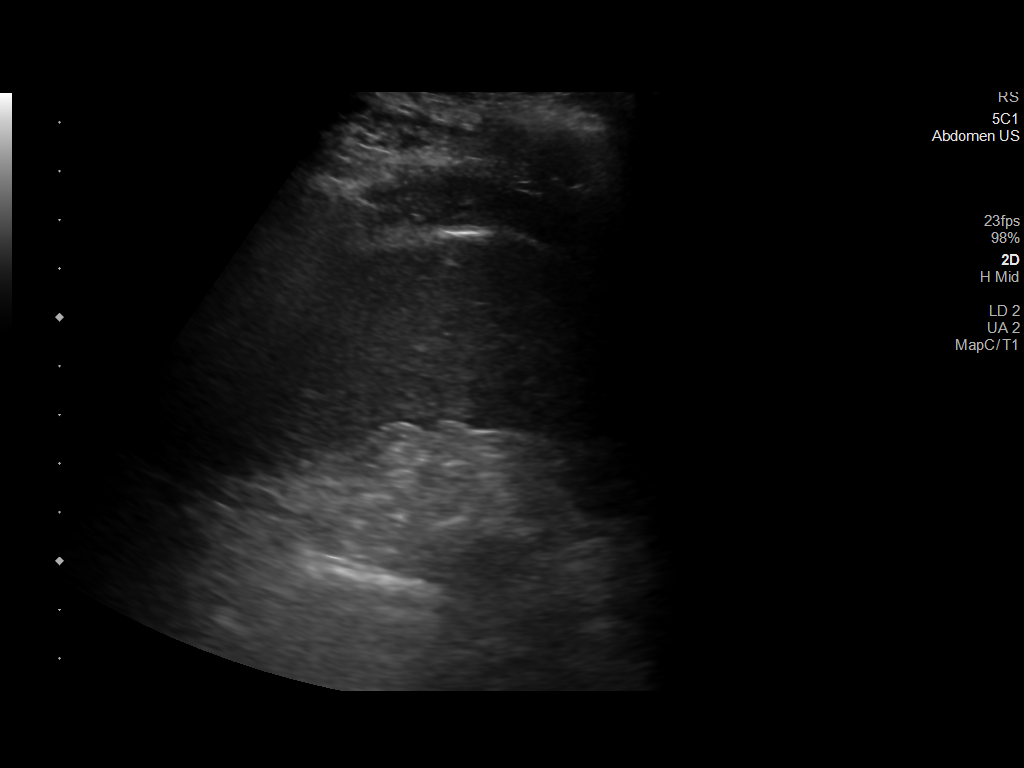
[im 6/7]
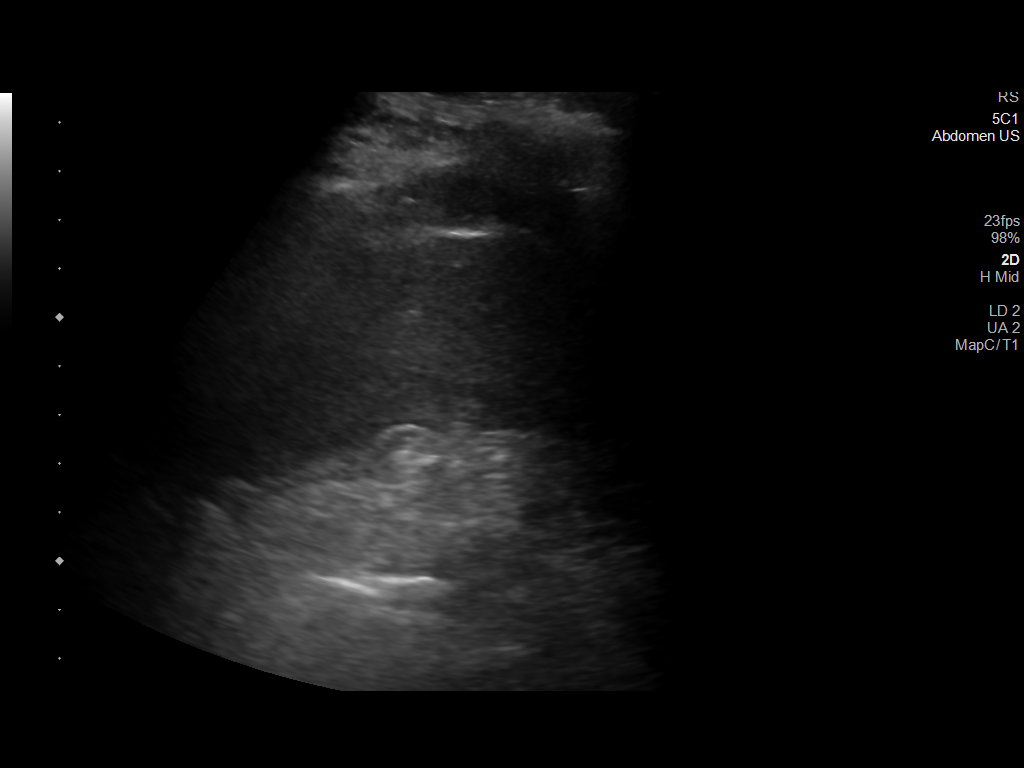
[im 7/7]
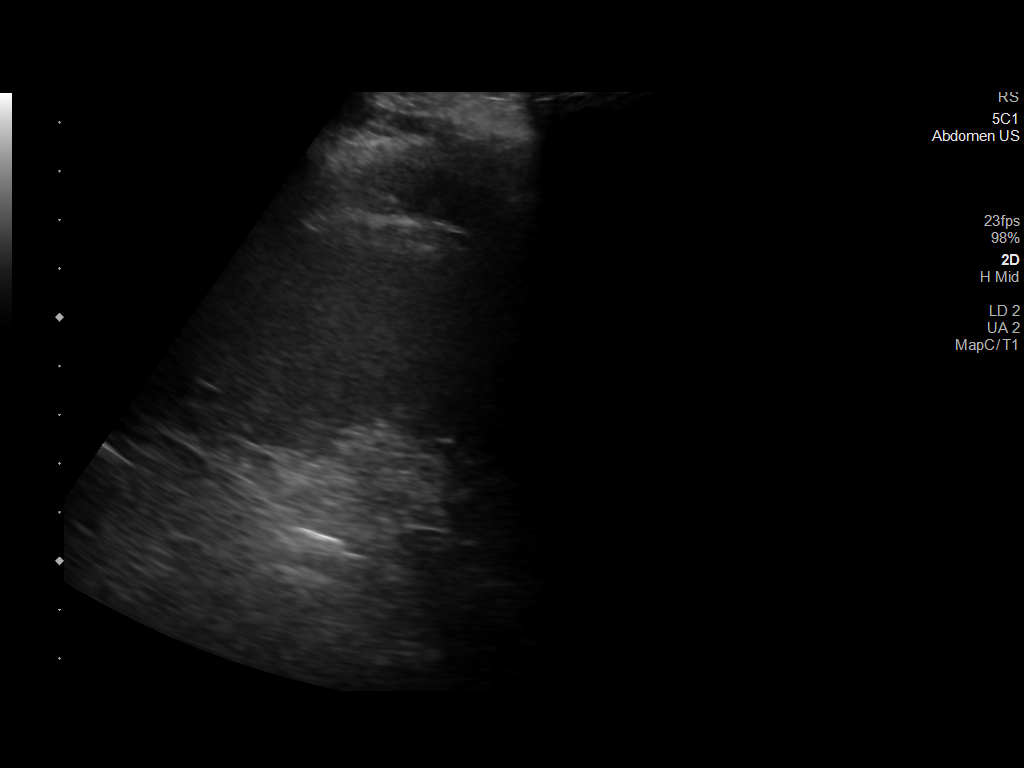

[7 of 7 positions shown; findings below may reference images not displayed]

EXAM:
ULTRASOUND GUIDED THERAPEUTIC THORACENTESIS

MEDICATIONS:
Lidocaine 1% 10 mL

COMPLICATIONS:
None immediate.

PROCEDURE:
An ultrasound guided thoracentesis was thoroughly discussed with the
patient and questions answered. The benefits, risks, alternatives
and complications were also discussed. The patient understands and
wishes to proceed with the procedure. Written consent was obtained.

Ultrasound was performed to localize and mark an adequate pocket of
fluid in the left chest. The area was then prepped and draped in the
normal sterile fashion. 1% Lidocaine was used for local anesthesia.
Under ultrasound guidance a 19 gauge, 7-cm, Yueh catheter was
introduced. Thoracentesis was performed. The catheter was removed
and a dressing applied.
FINDINGS: A total of approximately 200 mL of straw-colored fluid was removed.
IMPRESSION: Successful ultrasound guided therapeutic left-sided thoracentesis
yielding 200 mL of pleural fluid.

Read by: [employer], NP

## 2023-04-01 IMAGING — DX DG CHEST 1V PORT
1 series · 1 of 1 positions shown · non-contrast
Comparison: Radiograph 01/07/2021, chest CT 09/10/2020

CLINICAL DATA: Respiratory failure

EXAM:
PORTABLE CHEST 1 VIEW

[chest]
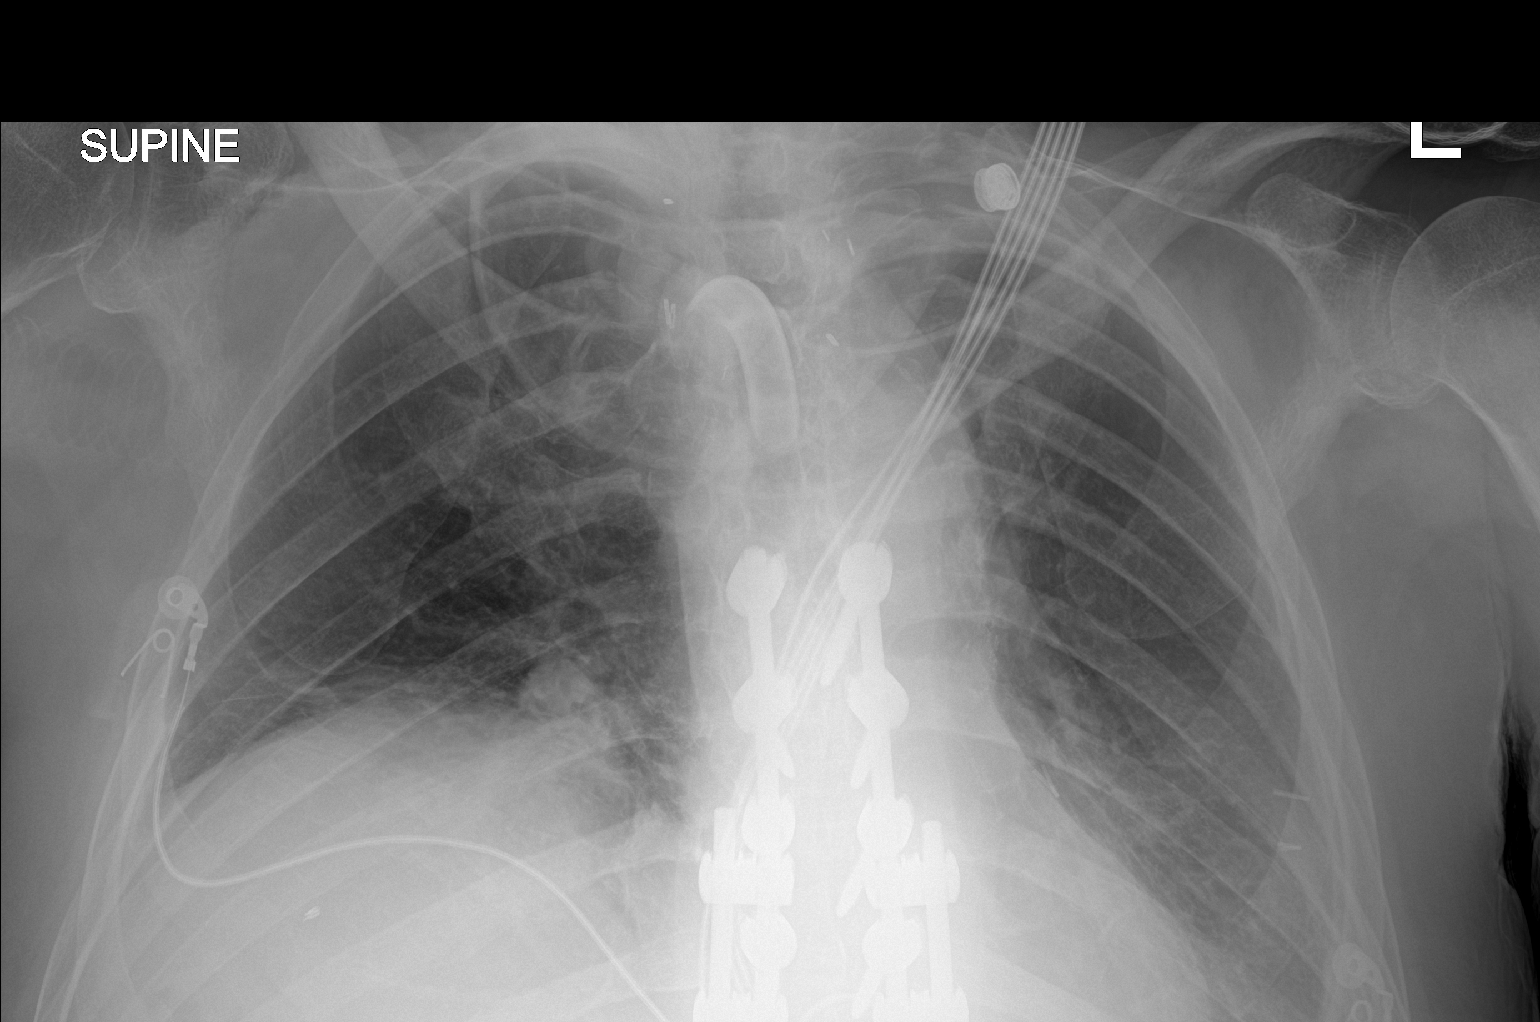

[1 of 1 positions shown; findings below may reference images not displayed]

FINDINGS: Tracheostomy tube overlies the midthoracic trachea. Unchanged
cardiomediastinal silhouette. Elevated right hemidiaphragm,
unchanged. Small bilateral pleural effusions, unchanged. Bibasilar
atelectasis. No visible pneumothorax. No acute osseous abnormality.
Partially visualized thoracolumbar fusion hardware.
IMPRESSION: Unchanged elevated right hemidiaphragm and small bilateral pleural
effusions with bibasilar opacities likely representing atelectasis.

## 2023-04-01 IMAGING — DX DG ABD PORTABLE 1V
1 series · 2 of 2 positions shown · non-contrast
Comparison: CT abdomen pelvis 12/16/2020

CLINICAL DATA: Constipation evaluation

EXAM:
PORTABLE ABDOMEN - 1 VIEW

[Series 1: abdomen · 0.14mm/px · 2 of 2 slices shown]
[im 1/2]
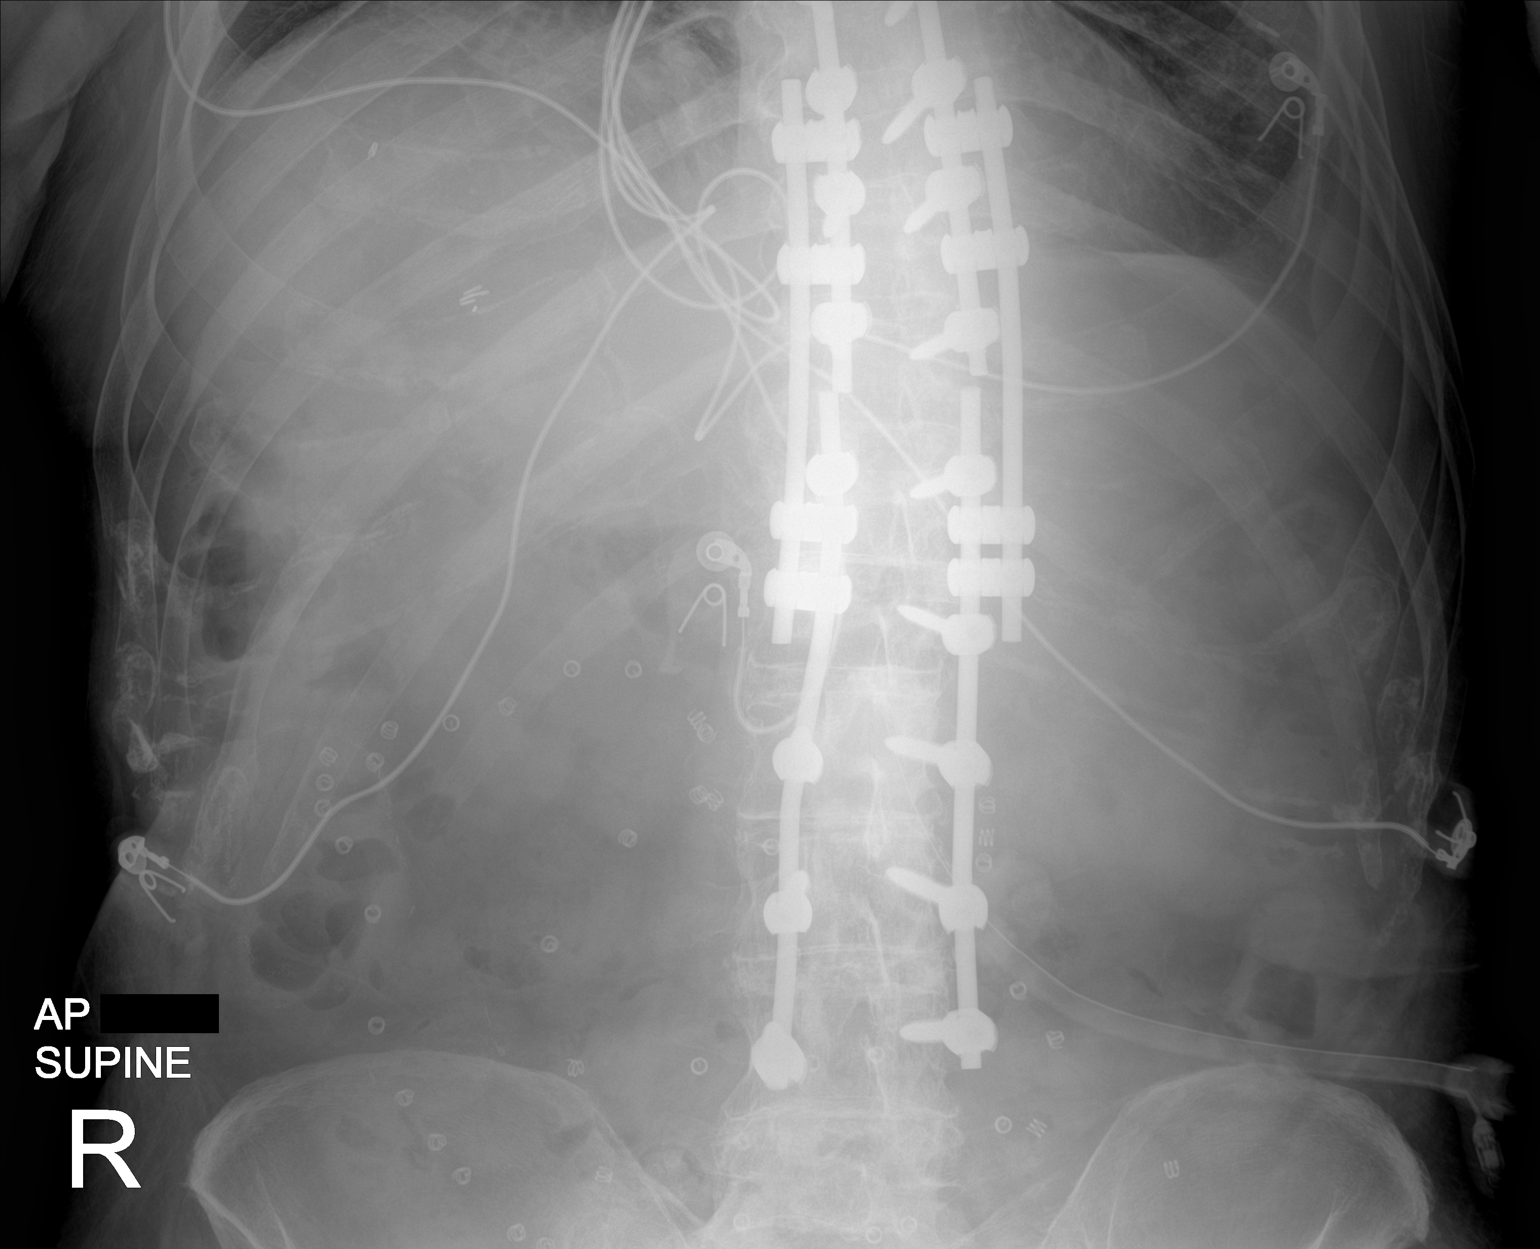
[im 2/2]
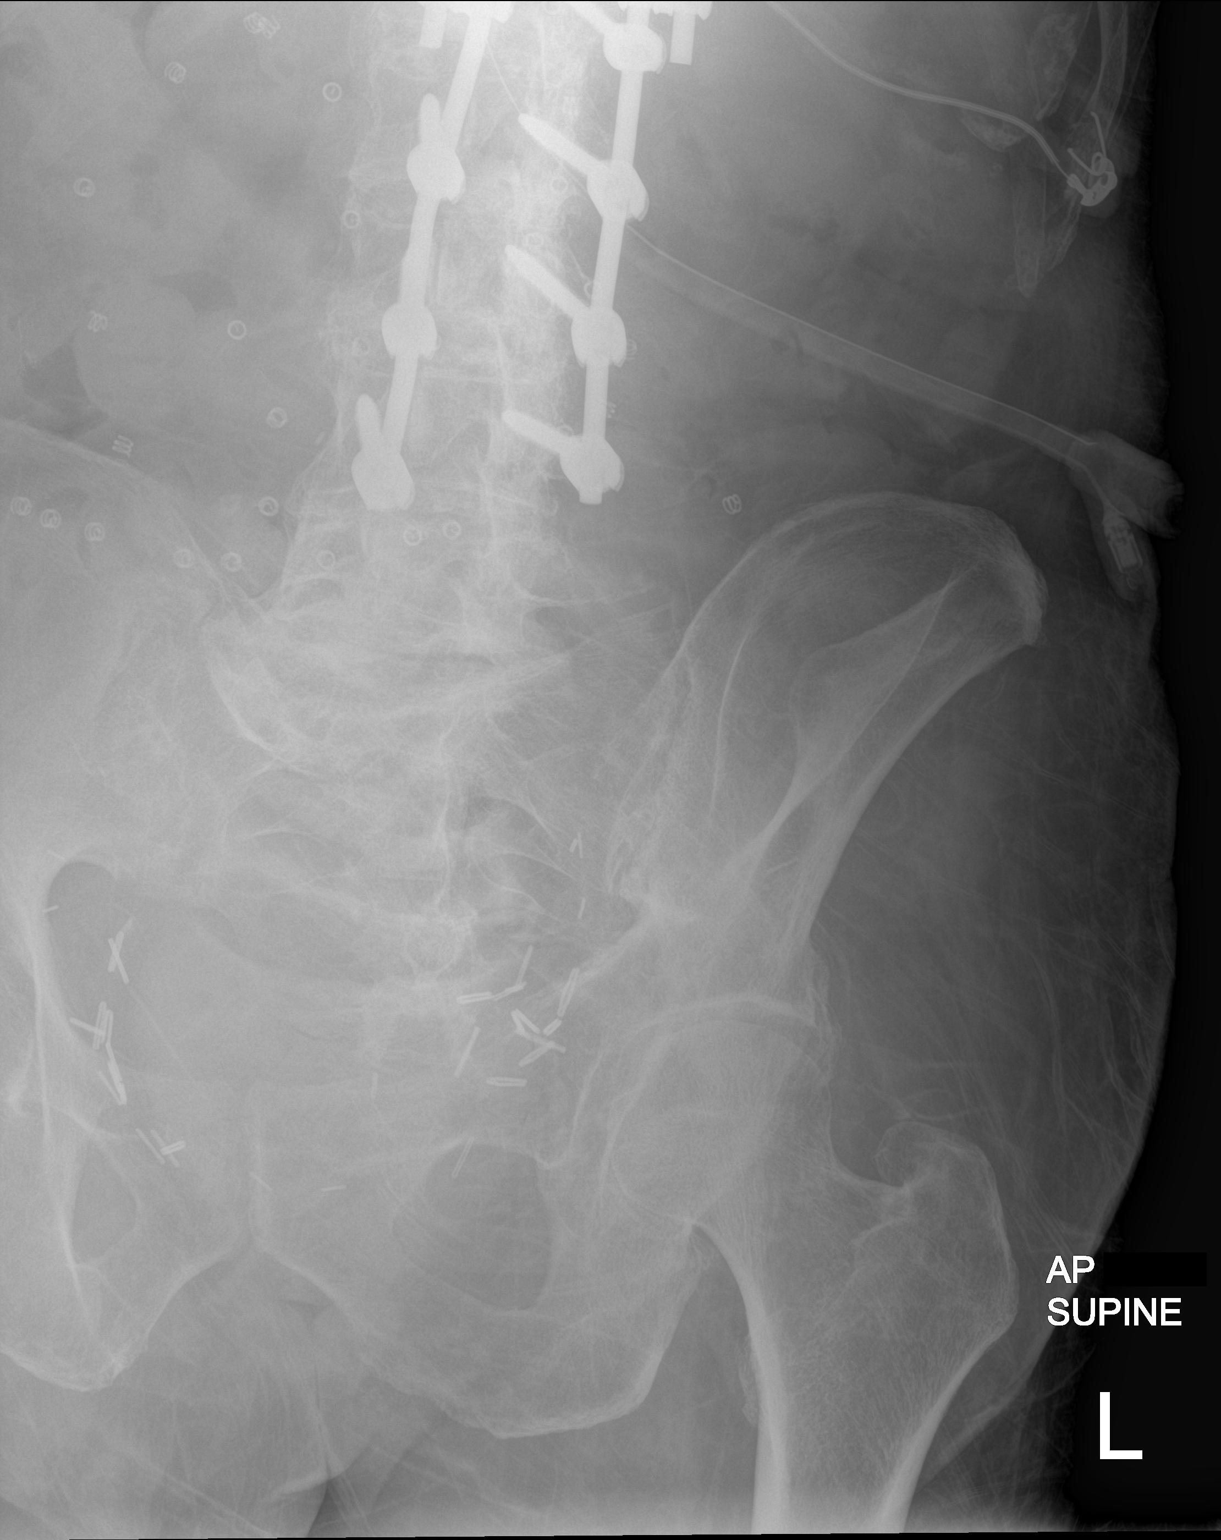

[2 of 2 positions shown; findings below may reference images not displayed]

FINDINGS: No evidence of bowel obstruction. Prior hernia repair. No acute
osseous abnormality. Partially visualized thoracolumbar fusion
hardware.
IMPRESSION: No evidence of bowel obstruction.

## 2023-04-08 IMAGING — DX DG CHEST 1V PORT
1 series · 1 of 1 positions shown · non-contrast
Comparison: January 27, 2021.

CLINICAL DATA: Shortness of breath.

EXAM:
PORTABLE CHEST 1 VIEW

[chest]
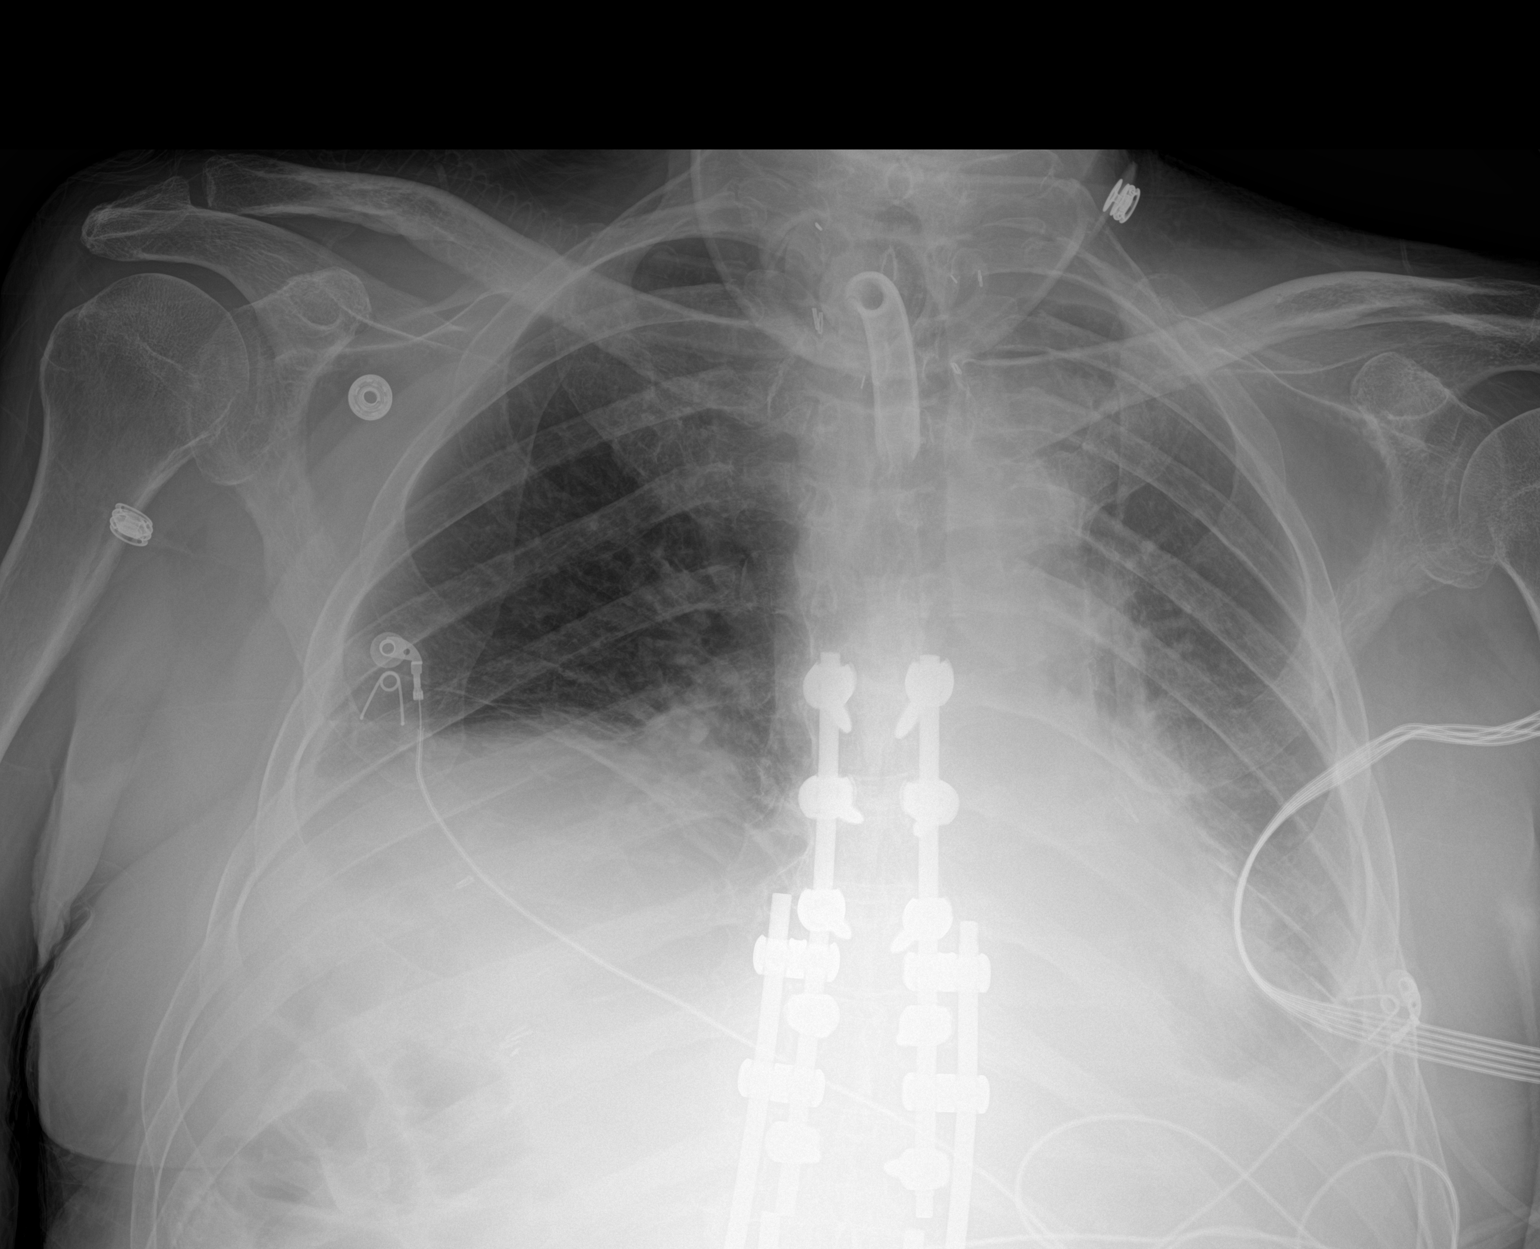

[1 of 1 positions shown; findings below may reference images not displayed]

FINDINGS: The heart size and mediastinal contours are within normal limits.
Tracheostomy tube is unchanged in position. Elevated right
hemidiaphragm is noted with mild right basilar subsegmental
atelectasis and probable small right pleural effusion. Stable left
basilar atelectasis is noted with probable pleural effusion. Status
post surgical posterior fusion of lower thoracic spine.
IMPRESSION: Stable bibasilar subsegmental atelectasis and small bilateral
pleural effusions.

## 2023-04-09 IMAGING — DX DG CHEST 1V PORT
1 series · 1 of 1 positions shown · non-contrast
Comparison: Chest x-ray from yesterday.

CLINICAL DATA: Shortness of breath.

EXAM:
PORTABLE CHEST 1 VIEW

[chest]
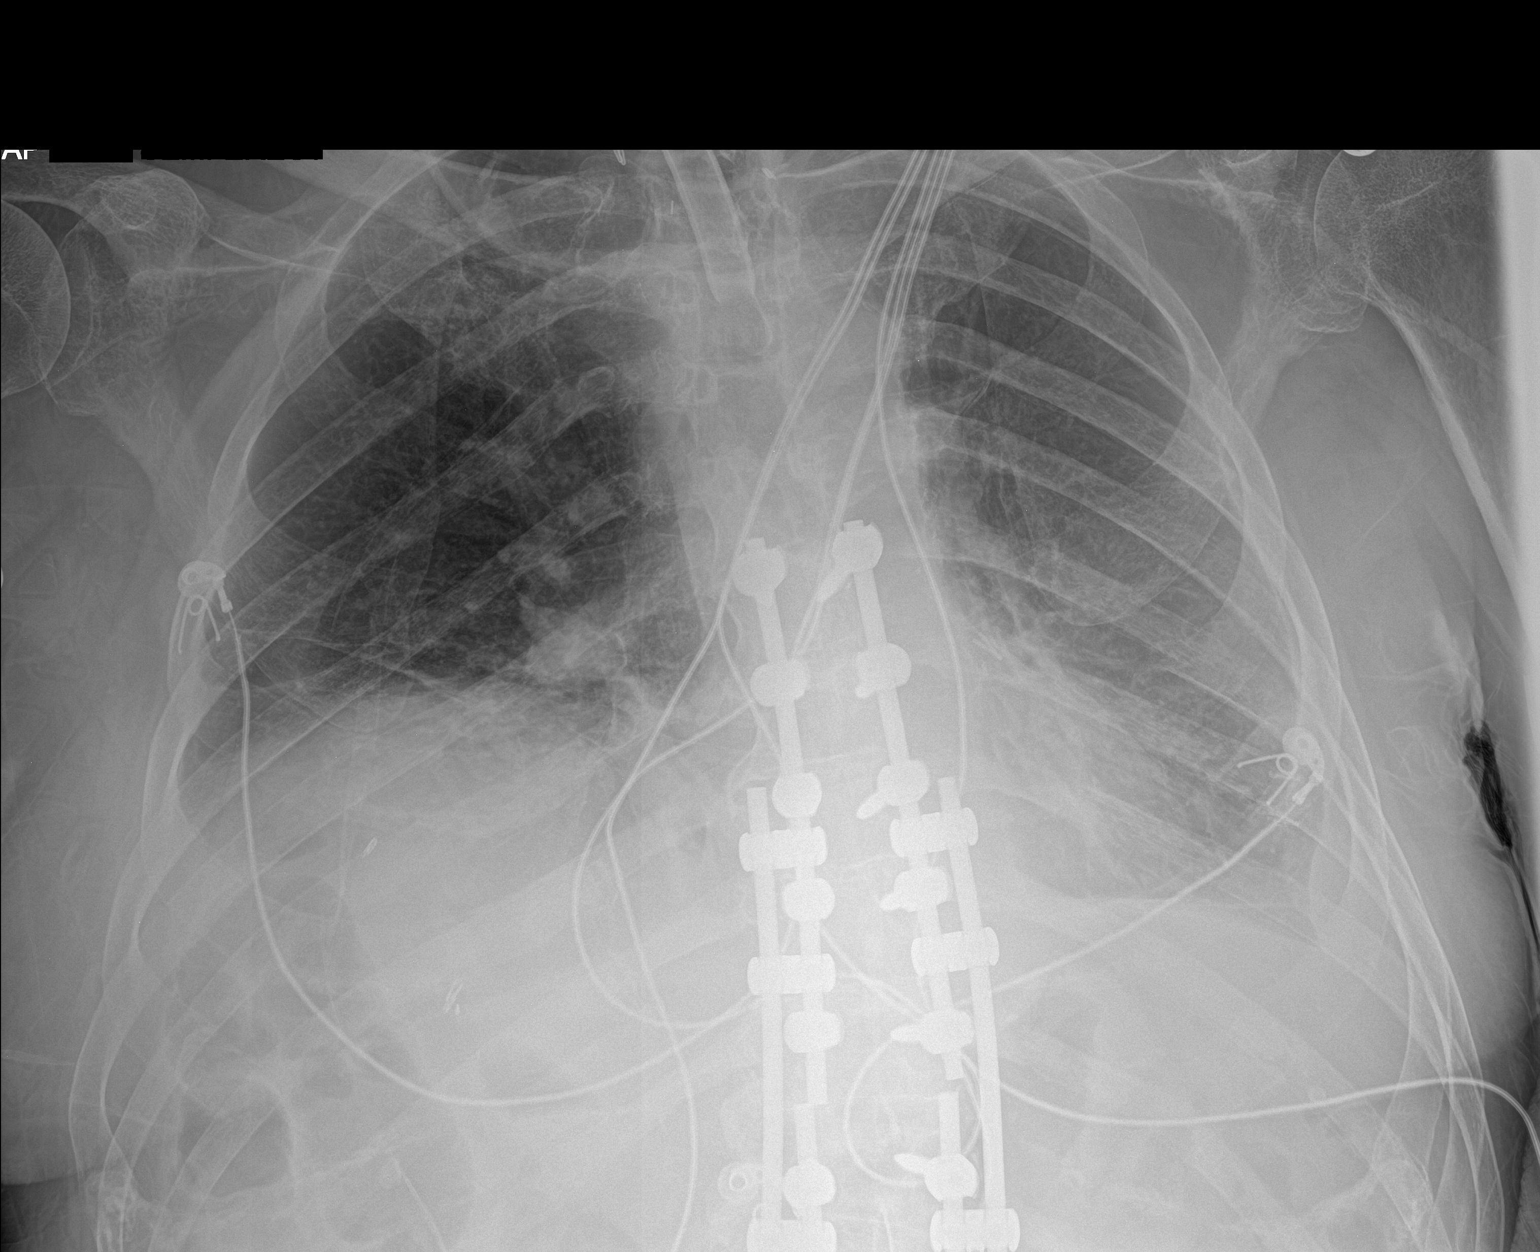

[1 of 1 positions shown; findings below may reference images not displayed]

FINDINGS: Unchanged tracheostomy tube. Normal heart size. Unchanged small
bilateral pleural effusions with bibasilar atelectasis. No
pneumothorax. No acute osseous abnormality.
IMPRESSION: 1. Unchanged small bilateral pleural effusions and bibasilar
atelectasis.

## 2023-04-11 IMAGING — DX DG CHEST 1V PORT
1 series · 1 of 1 positions shown · non-contrast
Comparison: Chest x-ray dated February 04, 2021

CLINICAL DATA: Pneumohemothorax.

EXAM:
PORTABLE CHEST 1 VIEW

[chest ap]
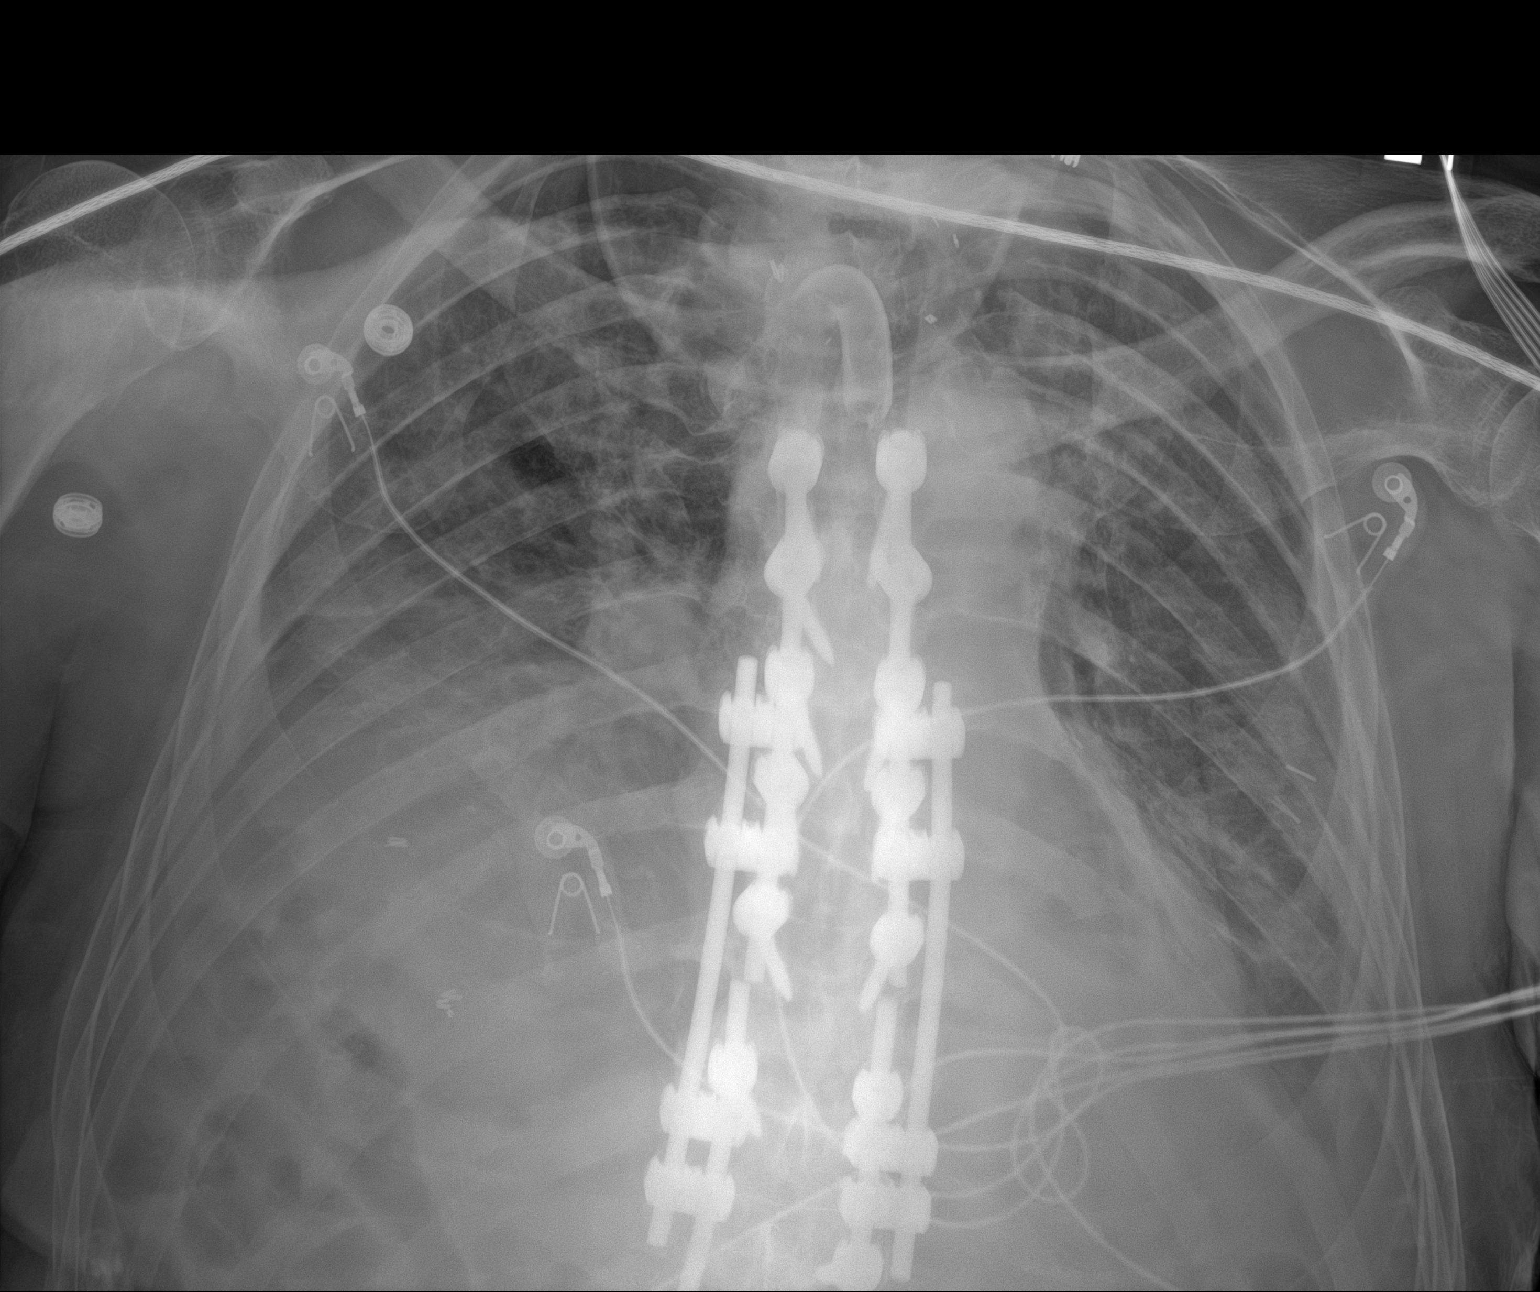

[1 of 1 positions shown; findings below may reference images not displayed]

FINDINGS: Tracheostomy tube in place. Basilar predominant opacities are
similar to prior exam, although evaluation is limited due to patient
positioning. No definite pneumothorax. Visualized cardiac and
mediastinal contours are unchanged.
IMPRESSION: Similar basilar predominant opacities which likely represent a
combination of atelectasis and pleural effusions.

## 2023-04-13 IMAGING — DX DG CHEST 1V PORT
1 series · 1 of 1 positions shown · non-contrast
Comparison: 02/06/2021

CLINICAL DATA: Oxygen desaturation.

EXAM:
PORTABLE CHEST 1 VIEW

[chest ap]
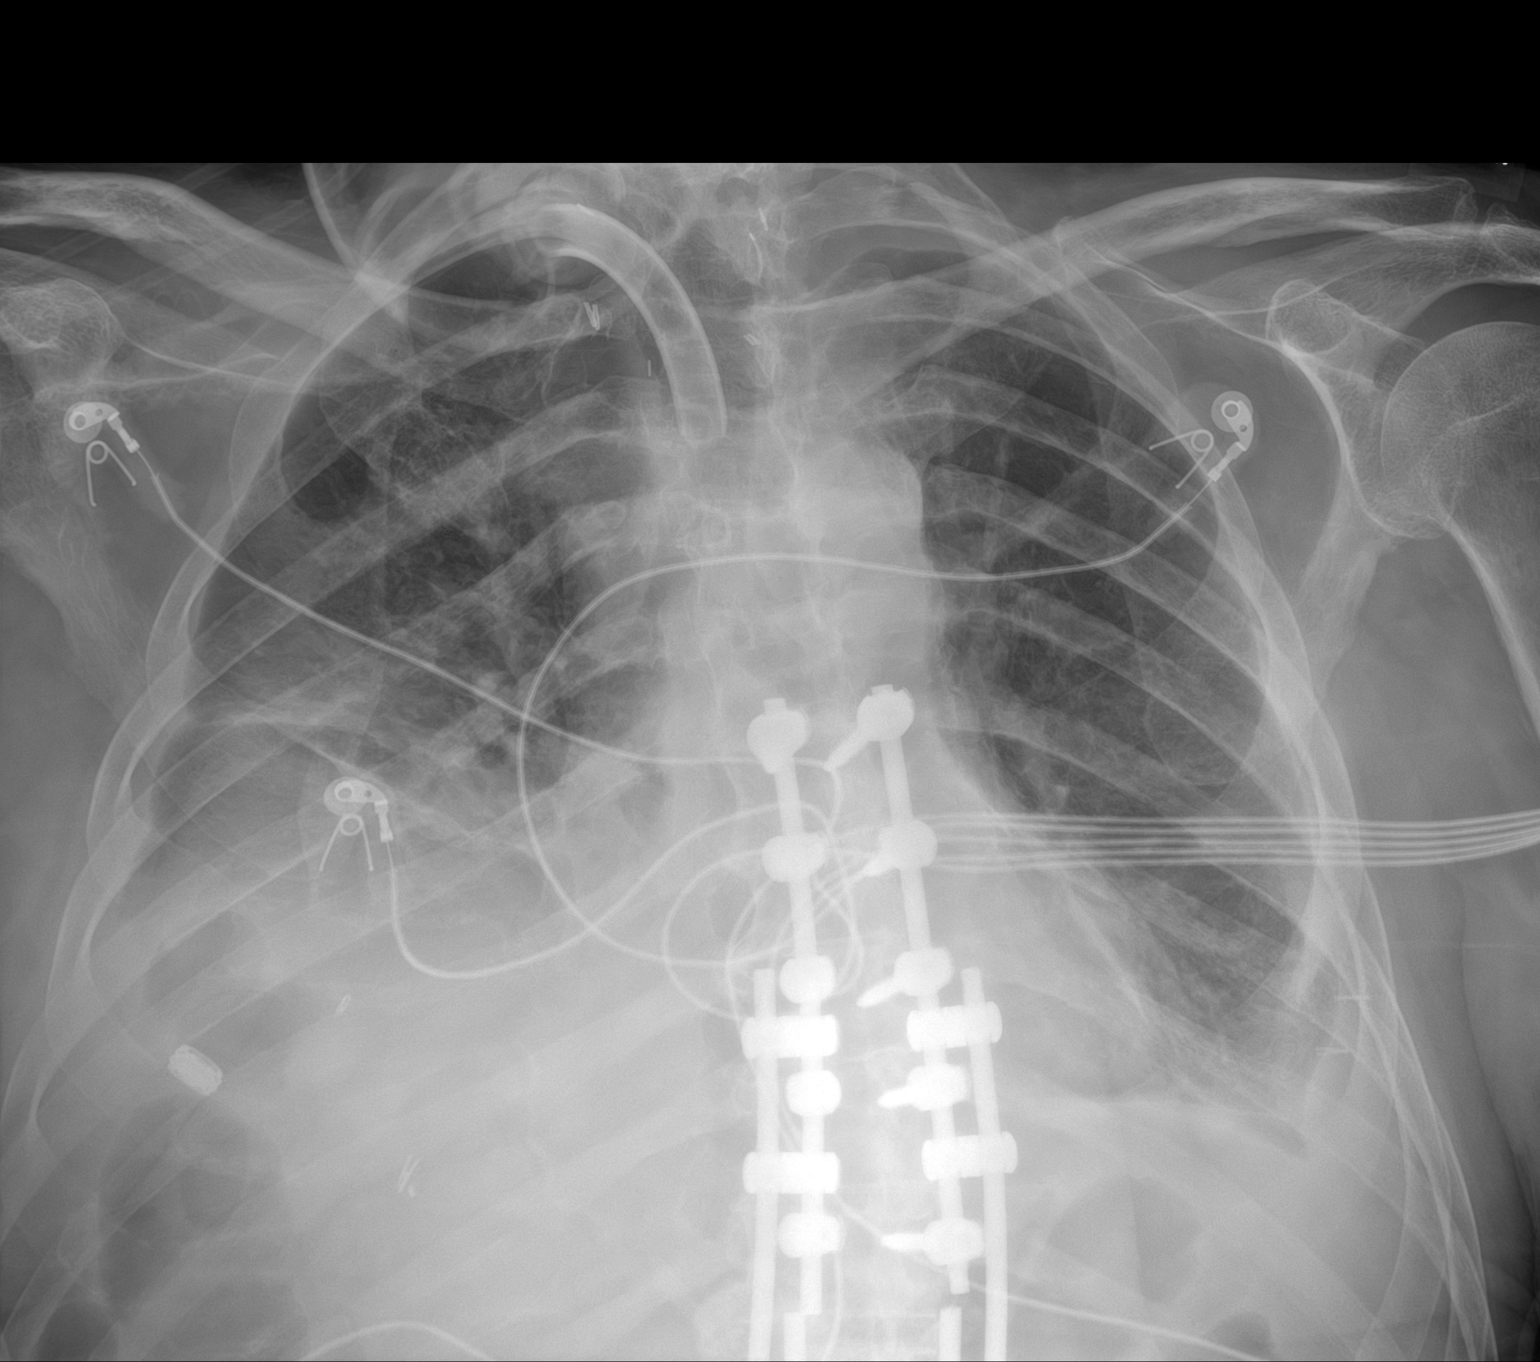

[1 of 1 positions shown; findings below may reference images not displayed]

FINDINGS: Tracheostomy tube unchanged. Stable cardiac silhouette. Low lung
volumes. There is bilateral pleural effusions and basilar
atelectasis. No pulmonary edema or pneumothorax. Posterior lumbar
fusion
IMPRESSION: 1. No significant change.
2. Low lung volumes, bibasilar atelectasis, and bilateral pleural
effusions.
